# Patient Record
Sex: Female | Born: 1950 | Race: Black or African American | Hispanic: No | Marital: Married | Smoking: Current every day smoker
Health system: Southern US, Community
[De-identification: ages and names within clinical notes are randomized; demographics above are authoritative.]

## PROBLEM LIST (undated history)

## (undated) DIAGNOSIS — T8859XA Other complications of anesthesia, initial encounter: Secondary | ICD-10-CM

## (undated) DIAGNOSIS — R0602 Shortness of breath: Secondary | ICD-10-CM

## (undated) DIAGNOSIS — C50919 Malignant neoplasm of unspecified site of unspecified female breast: Secondary | ICD-10-CM

## (undated) DIAGNOSIS — K219 Gastro-esophageal reflux disease without esophagitis: Secondary | ICD-10-CM

## (undated) DIAGNOSIS — M1990 Unspecified osteoarthritis, unspecified site: Secondary | ICD-10-CM

## (undated) DIAGNOSIS — Q355 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: Secondary | ICD-10-CM

## (undated) DIAGNOSIS — G629 Polyneuropathy, unspecified: Secondary | ICD-10-CM

## (undated) DIAGNOSIS — I5022 Chronic systolic (congestive) heart failure: Secondary | ICD-10-CM

## (undated) DIAGNOSIS — E876 Hypokalemia: Secondary | ICD-10-CM

## (undated) DIAGNOSIS — E559 Vitamin D deficiency, unspecified: Secondary | ICD-10-CM

## (undated) DIAGNOSIS — G43909 Migraine, unspecified, not intractable, without status migrainosus: Secondary | ICD-10-CM

## (undated) DIAGNOSIS — Z9981 Dependence on supplemental oxygen: Secondary | ICD-10-CM

## (undated) DIAGNOSIS — R011 Cardiac murmur, unspecified: Secondary | ICD-10-CM

## (undated) DIAGNOSIS — F419 Anxiety disorder, unspecified: Secondary | ICD-10-CM

## (undated) DIAGNOSIS — E039 Hypothyroidism, unspecified: Secondary | ICD-10-CM

## (undated) DIAGNOSIS — D649 Anemia, unspecified: Secondary | ICD-10-CM

## (undated) DIAGNOSIS — F329 Major depressive disorder, single episode, unspecified: Secondary | ICD-10-CM

## (undated) DIAGNOSIS — E785 Hyperlipidemia, unspecified: Secondary | ICD-10-CM

## (undated) DIAGNOSIS — T4145XA Adverse effect of unspecified anesthetic, initial encounter: Secondary | ICD-10-CM

## (undated) DIAGNOSIS — I10 Essential (primary) hypertension: Secondary | ICD-10-CM

## (undated) DIAGNOSIS — E1140 Type 2 diabetes mellitus with diabetic neuropathy, unspecified: Secondary | ICD-10-CM

## (undated) DIAGNOSIS — F411 Generalized anxiety disorder: Secondary | ICD-10-CM

## (undated) DIAGNOSIS — J9610 Chronic respiratory failure, unspecified whether with hypoxia or hypercapnia: Secondary | ICD-10-CM

## (undated) DIAGNOSIS — I951 Orthostatic hypotension: Secondary | ICD-10-CM

## (undated) DIAGNOSIS — M549 Dorsalgia, unspecified: Secondary | ICD-10-CM

## (undated) DIAGNOSIS — I447 Left bundle-branch block, unspecified: Secondary | ICD-10-CM

## (undated) HISTORY — DX: Type 2 diabetes mellitus with diabetic neuropathy, unspecified: E11.40

## (undated) HISTORY — DX: Vitamin D deficiency, unspecified: E55.9

## (undated) HISTORY — PX: COLONOSCOPY: SHX174

## (undated) HISTORY — DX: Malignant neoplasm of unspecified site of unspecified female breast: C50.919

## (undated) HISTORY — DX: Essential (primary) hypertension: I10

## (undated) HISTORY — PX: WISDOM TOOTH EXTRACTION: SHX21

## (undated) HISTORY — PX: OTHER SURGICAL HISTORY: SHX169

## (undated) HISTORY — DX: Major depressive disorder, single episode, unspecified: F32.9

## (undated) HISTORY — DX: Generalized anxiety disorder: F41.1

## (undated) HISTORY — DX: Unspecified osteoarthritis, unspecified site: M19.90

---
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
## 1999-12-02 ENCOUNTER — Ambulatory Visit (HOSPITAL_COMMUNITY): Admission: RE | Admit: 1999-12-02 | Discharge: 1999-12-02 | Payer: Self-pay | Admitting: Gastroenterology

## 1999-12-03 ENCOUNTER — Encounter: Admission: RE | Admit: 1999-12-03 | Discharge: 2000-03-02 | Payer: Self-pay | Admitting: Family Medicine

## 1999-12-15 ENCOUNTER — Encounter: Payer: Self-pay | Admitting: Gastroenterology

## 1999-12-15 ENCOUNTER — Ambulatory Visit (HOSPITAL_COMMUNITY): Admission: RE | Admit: 1999-12-15 | Discharge: 1999-12-15 | Payer: Self-pay | Admitting: Gastroenterology

## 2000-03-15 ENCOUNTER — Other Ambulatory Visit: Admission: RE | Admit: 2000-03-15 | Discharge: 2000-03-15 | Payer: Self-pay | Admitting: Gynecology

## 2000-03-19 ENCOUNTER — Encounter (INDEPENDENT_AMBULATORY_CARE_PROVIDER_SITE_OTHER): Payer: Self-pay

## 2000-03-19 ENCOUNTER — Other Ambulatory Visit: Admission: RE | Admit: 2000-03-19 | Discharge: 2000-03-19 | Payer: Self-pay | Admitting: Gynecology

## 2000-05-24 ENCOUNTER — Inpatient Hospital Stay (HOSPITAL_COMMUNITY): Admission: EM | Admit: 2000-05-24 | Discharge: 2000-05-29 | Payer: Self-pay | Admitting: Emergency Medicine

## 2000-05-26 ENCOUNTER — Encounter: Payer: Self-pay | Admitting: Cardiology

## 2000-06-17 ENCOUNTER — Encounter (HOSPITAL_COMMUNITY): Admission: RE | Admit: 2000-06-17 | Discharge: 2000-06-21 | Payer: Self-pay | Admitting: Cardiology

## 2000-08-26 ENCOUNTER — Ambulatory Visit (HOSPITAL_COMMUNITY): Admission: RE | Admit: 2000-08-26 | Discharge: 2000-08-26 | Payer: Self-pay | Admitting: Neurology

## 2000-08-26 ENCOUNTER — Encounter: Payer: Self-pay | Admitting: Neurology

## 2000-12-29 ENCOUNTER — Other Ambulatory Visit: Admission: RE | Admit: 2000-12-29 | Discharge: 2000-12-29 | Payer: Self-pay | Admitting: Gynecology

## 2002-04-03 ENCOUNTER — Encounter (HOSPITAL_COMMUNITY): Admission: RE | Admit: 2002-04-03 | Discharge: 2002-07-02 | Payer: Self-pay | Admitting: Cardiology

## 2002-07-03 ENCOUNTER — Encounter (HOSPITAL_COMMUNITY): Admission: RE | Admit: 2002-07-03 | Discharge: 2002-10-01 | Payer: Self-pay | Admitting: Cardiology

## 2002-07-26 ENCOUNTER — Encounter: Admission: RE | Admit: 2002-07-26 | Discharge: 2002-10-24 | Payer: Self-pay | Admitting: Family Medicine

## 2002-08-09 ENCOUNTER — Other Ambulatory Visit: Admission: RE | Admit: 2002-08-09 | Discharge: 2002-08-09 | Payer: Self-pay | Admitting: Gynecology

## 2003-03-13 ENCOUNTER — Encounter: Admission: RE | Admit: 2003-03-13 | Discharge: 2003-03-13 | Payer: Self-pay | Admitting: Emergency Medicine

## 2003-03-13 ENCOUNTER — Encounter: Payer: Self-pay | Admitting: Emergency Medicine

## 2003-05-11 ENCOUNTER — Emergency Department (HOSPITAL_COMMUNITY): Admission: AD | Admit: 2003-05-11 | Discharge: 2003-05-11 | Payer: Self-pay | Admitting: Family Medicine

## 2003-05-24 ENCOUNTER — Encounter: Admission: RE | Admit: 2003-05-24 | Discharge: 2003-08-22 | Payer: Self-pay | Admitting: Family Medicine

## 2003-09-03 ENCOUNTER — Emergency Department (HOSPITAL_COMMUNITY): Admission: AD | Admit: 2003-09-03 | Discharge: 2003-09-03 | Payer: Self-pay | Admitting: Family Medicine

## 2003-11-28 ENCOUNTER — Encounter (INDEPENDENT_AMBULATORY_CARE_PROVIDER_SITE_OTHER): Payer: Self-pay | Admitting: *Deleted

## 2003-11-28 ENCOUNTER — Ambulatory Visit (HOSPITAL_COMMUNITY): Admission: RE | Admit: 2003-11-28 | Discharge: 2003-11-28 | Payer: Self-pay | Admitting: Gastroenterology

## 2003-11-29 ENCOUNTER — Encounter: Admission: RE | Admit: 2003-11-29 | Discharge: 2003-11-29 | Payer: Self-pay | Admitting: Family Medicine

## 2004-01-03 ENCOUNTER — Ambulatory Visit (HOSPITAL_COMMUNITY): Admission: RE | Admit: 2004-01-03 | Discharge: 2004-01-03 | Payer: Self-pay | Admitting: Gynecology

## 2004-01-03 ENCOUNTER — Encounter (INDEPENDENT_AMBULATORY_CARE_PROVIDER_SITE_OTHER): Payer: Self-pay | Admitting: *Deleted

## 2005-05-22 ENCOUNTER — Emergency Department (HOSPITAL_COMMUNITY): Admission: EM | Admit: 2005-05-22 | Discharge: 2005-05-22 | Payer: Self-pay | Admitting: Emergency Medicine

## 2005-09-16 ENCOUNTER — Encounter: Admission: RE | Admit: 2005-09-16 | Discharge: 2005-09-16 | Payer: Self-pay | Admitting: Family Medicine

## 2005-09-18 ENCOUNTER — Inpatient Hospital Stay (HOSPITAL_COMMUNITY): Admission: EM | Admit: 2005-09-18 | Discharge: 2005-09-20 | Payer: Self-pay | Admitting: Family Medicine

## 2006-01-14 ENCOUNTER — Other Ambulatory Visit: Admission: RE | Admit: 2006-01-14 | Discharge: 2006-01-14 | Payer: Self-pay | Admitting: Gynecology

## 2006-03-13 ENCOUNTER — Encounter: Admission: RE | Admit: 2006-03-13 | Discharge: 2006-03-13 | Payer: Self-pay | Admitting: Orthopedic Surgery

## 2006-03-23 ENCOUNTER — Inpatient Hospital Stay (HOSPITAL_COMMUNITY): Admission: AD | Admit: 2006-03-23 | Discharge: 2006-04-02 | Payer: Self-pay | Admitting: Family Medicine

## 2006-03-27 ENCOUNTER — Encounter (INDEPENDENT_AMBULATORY_CARE_PROVIDER_SITE_OTHER): Payer: Self-pay | Admitting: Specialist

## 2006-04-01 ENCOUNTER — Encounter: Payer: Self-pay | Admitting: Vascular Surgery

## 2006-07-01 ENCOUNTER — Inpatient Hospital Stay (HOSPITAL_COMMUNITY): Admission: EM | Admit: 2006-07-01 | Discharge: 2006-07-09 | Payer: Self-pay | Admitting: Emergency Medicine

## 2006-07-23 ENCOUNTER — Ambulatory Visit (HOSPITAL_COMMUNITY): Admission: RE | Admit: 2006-07-23 | Discharge: 2006-07-23 | Payer: Self-pay | Admitting: Family Medicine

## 2007-01-18 ENCOUNTER — Other Ambulatory Visit: Admission: RE | Admit: 2007-01-18 | Discharge: 2007-01-18 | Payer: Self-pay | Admitting: Gynecology

## 2007-01-18 ENCOUNTER — Encounter: Admission: RE | Admit: 2007-01-18 | Discharge: 2007-02-18 | Payer: Self-pay | Admitting: Family Medicine

## 2007-07-07 ENCOUNTER — Encounter: Admission: RE | Admit: 2007-07-07 | Discharge: 2007-07-07 | Payer: Self-pay | Admitting: Orthopedic Surgery

## 2008-01-30 ENCOUNTER — Emergency Department (HOSPITAL_COMMUNITY): Admission: EM | Admit: 2008-01-30 | Discharge: 2008-01-30 | Payer: Self-pay | Admitting: Emergency Medicine

## 2008-03-15 ENCOUNTER — Other Ambulatory Visit: Admission: RE | Admit: 2008-03-15 | Discharge: 2008-03-15 | Payer: Self-pay | Admitting: Gynecology

## 2008-04-06 ENCOUNTER — Encounter (INDEPENDENT_AMBULATORY_CARE_PROVIDER_SITE_OTHER): Payer: Self-pay | Admitting: Family Medicine

## 2008-04-06 ENCOUNTER — Ambulatory Visit: Payer: Self-pay | Admitting: Vascular Surgery

## 2008-04-06 ENCOUNTER — Ambulatory Visit (HOSPITAL_COMMUNITY): Admission: RE | Admit: 2008-04-06 | Discharge: 2008-04-06 | Payer: Self-pay | Admitting: Family Medicine

## 2008-05-13 ENCOUNTER — Encounter: Admission: RE | Admit: 2008-05-13 | Discharge: 2008-05-13 | Payer: Self-pay | Admitting: Orthopedic Surgery

## 2008-05-25 HISTORY — PX: CARDIAC CATHETERIZATION: SHX172

## 2008-06-01 ENCOUNTER — Encounter
Admission: RE | Admit: 2008-06-01 | Discharge: 2008-08-30 | Payer: Self-pay | Admitting: Physical Medicine & Rehabilitation

## 2008-06-04 ENCOUNTER — Ambulatory Visit: Payer: Self-pay | Admitting: Physical Medicine & Rehabilitation

## 2008-06-11 ENCOUNTER — Encounter
Admission: RE | Admit: 2008-06-11 | Discharge: 2008-06-11 | Payer: Self-pay | Admitting: Physical Medicine & Rehabilitation

## 2008-07-03 ENCOUNTER — Ambulatory Visit: Payer: Self-pay | Admitting: Physical Medicine & Rehabilitation

## 2008-09-15 ENCOUNTER — Ambulatory Visit: Payer: Self-pay | Admitting: Infectious Disease

## 2008-09-16 ENCOUNTER — Inpatient Hospital Stay (HOSPITAL_COMMUNITY): Admission: EM | Admit: 2008-09-16 | Discharge: 2008-09-17 | Payer: Self-pay | Admitting: Family Medicine

## 2008-09-17 ENCOUNTER — Encounter: Payer: Self-pay | Admitting: Infectious Disease

## 2008-11-01 ENCOUNTER — Inpatient Hospital Stay (HOSPITAL_COMMUNITY): Admission: EM | Admit: 2008-11-01 | Discharge: 2008-11-07 | Payer: Self-pay | Admitting: Emergency Medicine

## 2008-11-05 ENCOUNTER — Encounter (HOSPITAL_COMMUNITY): Admission: RE | Admit: 2008-11-05 | Discharge: 2008-12-26 | Payer: Self-pay | Admitting: Cardiology

## 2009-01-04 ENCOUNTER — Encounter: Admission: RE | Admit: 2009-01-04 | Discharge: 2009-01-04 | Payer: Self-pay | Admitting: Orthopedic Surgery

## 2009-05-28 ENCOUNTER — Ambulatory Visit: Payer: Self-pay | Admitting: Surgery

## 2009-05-28 ENCOUNTER — Ambulatory Visit (HOSPITAL_COMMUNITY)
Admission: RE | Admit: 2009-05-28 | Discharge: 2009-05-28 | Payer: Self-pay | Admitting: Physical Medicine and Rehabilitation

## 2009-05-28 ENCOUNTER — Encounter (INDEPENDENT_AMBULATORY_CARE_PROVIDER_SITE_OTHER): Payer: Self-pay | Admitting: Physical Medicine and Rehabilitation

## 2009-08-05 ENCOUNTER — Inpatient Hospital Stay (HOSPITAL_COMMUNITY): Admission: EM | Admit: 2009-08-05 | Discharge: 2009-08-06 | Payer: Self-pay | Admitting: Emergency Medicine

## 2010-04-10 ENCOUNTER — Encounter: Admission: RE | Admit: 2010-04-10 | Discharge: 2010-04-10 | Payer: Self-pay | Admitting: Family Medicine

## 2010-05-25 HISTORY — PX: PORTACATH PLACEMENT: SHX2246

## 2010-06-17 ENCOUNTER — Other Ambulatory Visit: Payer: Self-pay | Admitting: Radiology

## 2010-06-23 ENCOUNTER — Ambulatory Visit: Payer: Self-pay | Admitting: Oncology

## 2010-06-25 ENCOUNTER — Other Ambulatory Visit: Payer: Self-pay | Admitting: Radiology

## 2010-06-25 DIAGNOSIS — C50912 Malignant neoplasm of unspecified site of left female breast: Secondary | ICD-10-CM

## 2010-06-28 ENCOUNTER — Encounter: Payer: Self-pay | Admitting: Radiology

## 2010-06-28 ENCOUNTER — Other Ambulatory Visit: Payer: Self-pay

## 2010-07-04 ENCOUNTER — Inpatient Hospital Stay
Admission: RE | Admit: 2010-07-04 | Discharge: 2010-07-04 | Payer: Self-pay | Source: Ambulatory Visit | Attending: Radiology | Admitting: Radiology

## 2010-07-05 ENCOUNTER — Ambulatory Visit
Admission: RE | Admit: 2010-07-05 | Discharge: 2010-07-05 | Disposition: A | Discharge status: Home / Self Care | Payer: 59 | Source: Ambulatory Visit | Attending: Radiology | Admitting: Radiology

## 2010-07-05 DIAGNOSIS — C50912 Malignant neoplasm of unspecified site of left female breast: Secondary | ICD-10-CM

## 2010-07-05 MED ORDER — GADOBENATE DIMEGLUMINE 529 MG/ML IV SOLN
20.0000 mL | Freq: Once | INTRAVENOUS | Status: AC | PRN
  Administered 2010-07-05: 20 mL via INTRAVENOUS

## 2010-07-07 ENCOUNTER — Ambulatory Visit: Payer: Self-pay | Admitting: Psychiatry

## 2010-07-07 ENCOUNTER — Encounter: Payer: Self-pay | Admitting: Genetic Counselor

## 2010-07-09 ENCOUNTER — Ambulatory Visit: Payer: Medicare Other | Admitting: Radiation Oncology

## 2010-07-09 ENCOUNTER — Other Ambulatory Visit: Payer: Self-pay | Admitting: Radiology

## 2010-07-09 DIAGNOSIS — R928 Other abnormal and inconclusive findings on diagnostic imaging of breast: Secondary | ICD-10-CM

## 2010-07-15 ENCOUNTER — Ambulatory Visit: Payer: 59

## 2010-07-18 ENCOUNTER — Other Ambulatory Visit: Payer: Self-pay | Admitting: Diagnostic Radiology

## 2010-07-18 ENCOUNTER — Ambulatory Visit
Admission: RE | Admit: 2010-07-18 | Discharge: 2010-07-18 | Disposition: A | Discharge status: Home / Self Care | Payer: Medicare Other | Source: Ambulatory Visit | Attending: Radiology | Admitting: Radiology

## 2010-07-18 ENCOUNTER — Ambulatory Visit
Admission: RE | Admit: 2010-07-18 | Discharge: 2010-07-18 | Disposition: A | Discharge status: Home / Self Care | Payer: 59 | Source: Ambulatory Visit | Attending: Radiology | Admitting: Radiology

## 2010-07-18 DIAGNOSIS — R928 Other abnormal and inconclusive findings on diagnostic imaging of breast: Secondary | ICD-10-CM

## 2010-07-18 MED ORDER — GADOBENATE DIMEGLUMINE 529 MG/ML IV SOLN
20.0000 mL | Freq: Once | INTRAVENOUS | Status: AC | PRN
  Administered 2010-07-18: 20 mL via INTRAVENOUS

## 2010-07-21 ENCOUNTER — Ambulatory Visit: Payer: PRIVATE HEALTH INSURANCE | Attending: Radiation Oncology | Admitting: Radiation Oncology

## 2010-07-21 ENCOUNTER — Ambulatory Visit: Payer: 59 | Admitting: Psychiatry

## 2010-07-21 ENCOUNTER — Encounter: Payer: 59 | Admitting: Genetic Counselor

## 2010-07-21 DIAGNOSIS — Z171 Estrogen receptor negative status [ER-]: Secondary | ICD-10-CM | POA: Insufficient documentation

## 2010-07-21 DIAGNOSIS — M109 Gout, unspecified: Secondary | ICD-10-CM | POA: Insufficient documentation

## 2010-07-21 DIAGNOSIS — Z803 Family history of malignant neoplasm of breast: Secondary | ICD-10-CM | POA: Insufficient documentation

## 2010-07-21 DIAGNOSIS — J4489 Other specified chronic obstructive pulmonary disease: Secondary | ICD-10-CM | POA: Insufficient documentation

## 2010-07-21 DIAGNOSIS — J449 Chronic obstructive pulmonary disease, unspecified: Secondary | ICD-10-CM | POA: Insufficient documentation

## 2010-07-21 DIAGNOSIS — E039 Hypothyroidism, unspecified: Secondary | ICD-10-CM | POA: Insufficient documentation

## 2010-07-21 DIAGNOSIS — E119 Type 2 diabetes mellitus without complications: Secondary | ICD-10-CM | POA: Insufficient documentation

## 2010-07-21 DIAGNOSIS — C50419 Malignant neoplasm of upper-outer quadrant of unspecified female breast: Secondary | ICD-10-CM | POA: Insufficient documentation

## 2010-07-21 DIAGNOSIS — I447 Left bundle-branch block, unspecified: Secondary | ICD-10-CM | POA: Insufficient documentation

## 2010-07-21 DIAGNOSIS — E7800 Pure hypercholesterolemia, unspecified: Secondary | ICD-10-CM | POA: Insufficient documentation

## 2010-07-21 DIAGNOSIS — I509 Heart failure, unspecified: Secondary | ICD-10-CM | POA: Insufficient documentation

## 2010-07-21 DIAGNOSIS — G4700 Insomnia, unspecified: Secondary | ICD-10-CM | POA: Insufficient documentation

## 2010-07-21 DIAGNOSIS — I428 Other cardiomyopathies: Secondary | ICD-10-CM | POA: Insufficient documentation

## 2010-07-22 ENCOUNTER — Encounter (HOSPITAL_BASED_OUTPATIENT_CLINIC_OR_DEPARTMENT_OTHER): Payer: Medicare Other | Admitting: Oncology

## 2010-07-22 ENCOUNTER — Other Ambulatory Visit: Payer: Self-pay | Admitting: Oncology

## 2010-07-22 ENCOUNTER — Other Ambulatory Visit (HOSPITAL_COMMUNITY): Payer: Self-pay | Admitting: General Surgery

## 2010-07-22 DIAGNOSIS — C50919 Malignant neoplasm of unspecified site of unspecified female breast: Secondary | ICD-10-CM

## 2010-07-22 DIAGNOSIS — D0590 Unspecified type of carcinoma in situ of unspecified breast: Secondary | ICD-10-CM

## 2010-07-22 LAB — CBC WITH DIFFERENTIAL/PLATELET
BASO%: 0.2 % (ref 0.0–2.0)
Basophils Absolute: 0 10*3/uL (ref 0.0–0.1)
EOS%: 6.8 % (ref 0.0–7.0)
Eosinophils Absolute: 0.4 10*3/uL (ref 0.0–0.5)
HCT: 33.8 % — ABNORMAL LOW (ref 34.8–46.6)
HGB: 11.2 g/dL — ABNORMAL LOW (ref 11.6–15.9)
LYMPH%: 26 % (ref 14.0–49.7)
MCH: 31.4 pg (ref 25.1–34.0)
MCHC: 33.1 g/dL (ref 31.5–36.0)
MCV: 94.7 fL (ref 79.5–101.0)
MONO#: 0.4 10*3/uL (ref 0.1–0.9)
MONO%: 7.7 % (ref 0.0–14.0)
NEUT#: 3.4 10*3/uL (ref 1.5–6.5)
NEUT%: 59.3 % (ref 38.4–76.8)
Platelets: 337 10*3/uL (ref 145–400)
RBC: 3.57 10*6/uL — ABNORMAL LOW (ref 3.70–5.45)
RDW: 14.5 % (ref 11.2–14.5)
WBC: 5.7 10*3/uL (ref 3.9–10.3)
lymph#: 1.5 10*3/uL (ref 0.9–3.3)

## 2010-07-22 LAB — COMPREHENSIVE METABOLIC PANEL
ALT: 18 U/L (ref 0–35)
AST: 14 U/L (ref 0–37)
Albumin: 3.4 g/dL — ABNORMAL LOW (ref 3.5–5.2)
Alkaline Phosphatase: 118 U/L — ABNORMAL HIGH (ref 39–117)
BUN: 20 mg/dL (ref 6–23)
CO2: 29 mEq/L (ref 19–32)
Calcium: 9.1 mg/dL (ref 8.4–10.5)
Chloride: 104 mEq/L (ref 96–112)
Creatinine, Ser: 1.2 mg/dL (ref 0.40–1.20)
Glucose, Bld: 138 mg/dL — ABNORMAL HIGH (ref 70–99)
Potassium: 4 mEq/L (ref 3.5–5.3)
Sodium: 144 mEq/L (ref 135–145)
Total Bilirubin: 0.3 mg/dL (ref 0.3–1.2)
Total Protein: 6.4 g/dL (ref 6.0–8.3)

## 2010-07-23 ENCOUNTER — Ambulatory Visit (INDEPENDENT_AMBULATORY_CARE_PROVIDER_SITE_OTHER): Payer: Medicare Other | Admitting: Psychiatry

## 2010-07-23 DIAGNOSIS — Z638 Other specified problems related to primary support group: Secondary | ICD-10-CM

## 2010-07-23 DIAGNOSIS — F0630 Mood disorder due to known physiological condition, unspecified: Secondary | ICD-10-CM

## 2010-07-25 ENCOUNTER — Other Ambulatory Visit (HOSPITAL_COMMUNITY): Payer: Self-pay | Admitting: General Surgery

## 2010-07-25 ENCOUNTER — Encounter (HOSPITAL_COMMUNITY)
Admission: RE | Admit: 2010-07-25 | Discharge: 2010-07-25 | Disposition: A | Discharge status: Home / Self Care | Payer: Medicare Other | Source: Ambulatory Visit | Attending: General Surgery | Admitting: General Surgery

## 2010-07-25 ENCOUNTER — Ambulatory Visit (HOSPITAL_COMMUNITY)
Admission: RE | Admit: 2010-07-25 | Discharge: 2010-07-25 | Disposition: A | Discharge status: Home / Self Care | Payer: Medicare Other | Source: Ambulatory Visit | Attending: General Surgery | Admitting: General Surgery

## 2010-07-25 DIAGNOSIS — J45909 Unspecified asthma, uncomplicated: Secondary | ICD-10-CM | POA: Insufficient documentation

## 2010-07-25 DIAGNOSIS — I517 Cardiomegaly: Secondary | ICD-10-CM | POA: Insufficient documentation

## 2010-07-25 DIAGNOSIS — C50919 Malignant neoplasm of unspecified site of unspecified female breast: Secondary | ICD-10-CM

## 2010-07-25 DIAGNOSIS — Z01812 Encounter for preprocedural laboratory examination: Secondary | ICD-10-CM | POA: Insufficient documentation

## 2010-07-25 DIAGNOSIS — Z01818 Encounter for other preprocedural examination: Secondary | ICD-10-CM | POA: Insufficient documentation

## 2010-07-25 DIAGNOSIS — Z01810 Encounter for preprocedural cardiovascular examination: Secondary | ICD-10-CM | POA: Insufficient documentation

## 2010-07-25 DIAGNOSIS — I10 Essential (primary) hypertension: Secondary | ICD-10-CM | POA: Insufficient documentation

## 2010-07-25 LAB — DIFFERENTIAL
Basophils Absolute: 0 10*3/uL (ref 0.0–0.1)
Basophils Relative: 0 % (ref 0–1)
Eosinophils Absolute: 0.4 10*3/uL (ref 0.0–0.7)
Eosinophils Relative: 5 % (ref 0–5)
Lymphocytes Relative: 24 % (ref 12–46)
Lymphs Abs: 1.9 10*3/uL (ref 0.7–4.0)
Monocytes Absolute: 0.6 10*3/uL (ref 0.1–1.0)
Monocytes Relative: 8 % (ref 3–12)
Neutro Abs: 5.1 10*3/uL (ref 1.7–7.7)
Neutrophils Relative %: 63 % (ref 43–77)

## 2010-07-25 LAB — CBC
HCT: 34.9 % — ABNORMAL LOW (ref 36.0–46.0)
Hemoglobin: 11.4 g/dL — ABNORMAL LOW (ref 12.0–15.0)
MCH: 31.3 pg (ref 26.0–34.0)
MCHC: 32.7 g/dL (ref 30.0–36.0)
MCV: 95.9 fL (ref 78.0–100.0)
Platelets: 427 10*3/uL — ABNORMAL HIGH (ref 150–400)
RBC: 3.64 MIL/uL — ABNORMAL LOW (ref 3.87–5.11)
RDW: 15 % (ref 11.5–15.5)
WBC: 8 10*3/uL (ref 4.0–10.5)

## 2010-07-25 LAB — COMPREHENSIVE METABOLIC PANEL
ALT: 18 U/L (ref 0–35)
AST: 20 U/L (ref 0–37)
Albumin: 3.3 g/dL — ABNORMAL LOW (ref 3.5–5.2)
Alkaline Phosphatase: 104 U/L (ref 39–117)
BUN: 13 mg/dL (ref 6–23)
CO2: 26 mEq/L (ref 19–32)
Calcium: 9 mg/dL (ref 8.4–10.5)
Chloride: 106 mEq/L (ref 96–112)
Creatinine, Ser: 1.18 mg/dL (ref 0.4–1.2)
GFR calc Af Amer: 57 mL/min — ABNORMAL LOW (ref >60)
GFR calc non Af Amer: 47 mL/min — ABNORMAL LOW (ref >60)
Glucose, Bld: 123 mg/dL — ABNORMAL HIGH (ref 70–99)
Potassium: 3.6 mEq/L (ref 3.5–5.1)
Sodium: 141 mEq/L (ref 135–145)
Total Bilirubin: 0.4 mg/dL (ref 0.3–1.2)
Total Protein: 6.4 g/dL (ref 6.0–8.3)

## 2010-07-25 LAB — SURGICAL PCR SCREEN
MRSA, PCR: NEGATIVE
Staphylococcus aureus: NEGATIVE

## 2010-07-25 LAB — CANCER ANTIGEN 27.29: CA 27.29: 25 U/mL (ref 0–39)

## 2010-07-28 ENCOUNTER — Ambulatory Visit (HOSPITAL_COMMUNITY)
Admission: RE | Admit: 2010-07-28 | Discharge: 2010-07-28 | Disposition: A | Discharge status: Home / Self Care | Payer: Medicare Other | Source: Ambulatory Visit | Attending: General Surgery | Admitting: General Surgery

## 2010-07-28 ENCOUNTER — Other Ambulatory Visit: Payer: Self-pay | Admitting: General Surgery

## 2010-07-28 ENCOUNTER — Observation Stay (HOSPITAL_COMMUNITY)
Admission: RE | Admit: 2010-07-28 | Discharge: 2010-07-29 | Disposition: A | Discharge status: Home / Self Care | Payer: Medicare Other | Source: Ambulatory Visit | Attending: General Surgery | Admitting: General Surgery

## 2010-07-28 DIAGNOSIS — I428 Other cardiomyopathies: Secondary | ICD-10-CM | POA: Insufficient documentation

## 2010-07-28 DIAGNOSIS — C50919 Malignant neoplasm of unspecified site of unspecified female breast: Principal | ICD-10-CM | POA: Insufficient documentation

## 2010-07-28 DIAGNOSIS — I10 Essential (primary) hypertension: Secondary | ICD-10-CM | POA: Insufficient documentation

## 2010-07-28 DIAGNOSIS — E669 Obesity, unspecified: Secondary | ICD-10-CM | POA: Insufficient documentation

## 2010-07-28 HISTORY — PX: BREAST LUMPECTOMY: SHX2

## 2010-07-28 LAB — GLUCOSE, CAPILLARY
Glucose-Capillary: 143 mg/dL — ABNORMAL HIGH (ref 70–99)
Glucose-Capillary: 103 mg/dL — ABNORMAL HIGH (ref 70–99)
Glucose-Capillary: 95 mg/dL (ref 70–99)
Glucose-Capillary: 143 mg/dL — ABNORMAL HIGH (ref 70–99)

## 2010-07-28 MED ORDER — TECHNETIUM TC 99M SULFUR COLLOID FILTERED
1.0000 | Freq: Once | INTRAVENOUS | Status: AC | PRN
  Administered 2010-07-28: 1 via INTRADERMAL

## 2010-07-29 LAB — GLUCOSE, CAPILLARY: Glucose-Capillary: 136 mg/dL — ABNORMAL HIGH (ref 70–99)

## 2010-07-31 NOTE — Op Note (Addendum)
Rhonda [name], Rhonda [name] NO.:  [id number]  MEDICAL RECORD NO.:  [id number]           PATIENT TYPE:  I  LOCATION:  5158                         FACILITY:  [facility]  PHYSICIAN:  Juanetta Gosling, MDDATE OF BIRTH:  05-11-1951  DATE OF PROCEDURE:  07/28/2010 DATE OF DISCHARGE:                              OPERATIVE REPORT   PREOPERATIVE DIAGNOSIS:  Clinical stage I left breast cancer.  POSTOPERATIVE DIAGNOSIS:  Clinical stage I left breast cancer.  PROCEDURES: 1. Left breast wire-guided lumpectomy. 2. Injection of methylene blue dye for sentinel lymph node     identification. 3. Left axillary sentinel node biopsy x2.  SURGEON:  Troy Sine. Dwain Sarna, MD  ASSISTANT:  None.  ANESTHESIA:  General.  SUPERVISING ANESTHESIOLOGIST:  Quita Skye. Krista Blue, MD  SPECIMEN: 1. Left breast tissue marked with paint kit. 2. Left axillary sentinel node with counts of 3704. 3. Left axillary sentinel node with counts of 1583 and blue.  DISPOSITION OF SPECIMENS:  Pathology.  ESTIMATED BLOOD LOSS:  Minimal.  COMPLICATIONS:  None.  DRAINS:  None.  DISPOSITION:  To recovery room in stable conditions.  HISTORY:  Rhonda [name] is a 60 year old female I initially saw when she had an active upper respiratory infection in early February 2012 with a newly diagnosed left breast cancer.  This on ultrasound mammogram and MRI appeared to be less than 2-cm breast mass in the upper outer quadrant.  She had another area found on MRI that was biopsied showing benign fibrocystic changes and according to the radiologist there no need for any further evaluation of this.  She had been evaluated by Dr. Dorothy Puffer preoperatively and determined she is a candidate for radiation due to my concern for her pulmonary issues, she is and I brought her back.  Her pathology showed ductal carcinoma.  There was a concern for microinvasion, so we discussed the different options and we elected to do breast  conservation therapy with a sentinel node biopsy.  PROCEDURE IN DETAIL:  After informed consent was obtained, the patient [facility] where she had a wire placed up by Dr. Tilda Burrow.  I discussed the wire placement with him prior to beginning her surgery and I had the mammograms available for my review during the surgery.  She was then brought to [facility].  She was administered 400 mg of IV ciprofloxacin due to her multiple allergies. She had sequential compression devices placed on lower extremities prior to induction of anesthesia.  She had technetium administered in the standard periareolar fashion prior to beginning.  She was then taken to the operating room, placed under general anesthesia without complication.  Her left breast and axilla were then prepped and draped in standard sterile surgical fashion.  Surgical time-out was then performed.  I made a radial incision overlying her lesion as well as her wire.  A cautery was then used to excise the breast tissue past the wire.  This was marked down to the pectoralis fascia, as some of this was actually underneath her pectoralis muscle towards her axilla.  This was then excised in total.  I placed two clips deep and one clip in each cardinal position of this.  Hemostasis was obtained.  A Faxitron mammogram confirmed removal of the clip as well as the lesion.  This was confirmed by the radiologist as well.  This was packed with a sponge.  I then made an incision in her axilla.  I located two sentinel nodes, one with the counts as listed above.  These were both removed.  The background radioactivity was less than 40.  There were no other blue nodes that were present.  I then closed her axillary fascia with a 2-0 Vicryl, the dermis with a 3-0 Vicryl, skin with a 4-0 Monocryl in subcuticular fashion.  I closed her breast tissue with multiple layers of 2-0 Vicryl bringing her breast tissue together to try to  obliterate her cavity, then 3-0 Vicryl, some 4-0 Monocryl for the skin.  Steri-Strips and sterile dressings were placed.  A breast binder was placed and she was extubated in the operating room and transferred to the recovery in stable condition.     Juanetta Gosling, MD     MCW/MEDQ  D:  07/28/2010  T:  07/29/2010  Job:  [id number]  cc:   Otilio Connors. [name], M.D. Radene Gunning, MD, PhD Drue Second, MD Rogelia Mire, M.D.  Electronically Signed by Emelia Loron MD on 07/29/2010 07:43:20 PM

## 2010-08-11 ENCOUNTER — Encounter (HOSPITAL_BASED_OUTPATIENT_CLINIC_OR_DEPARTMENT_OTHER): Payer: PRIVATE HEALTH INSURANCE | Admitting: Oncology

## 2010-08-11 DIAGNOSIS — D0590 Unspecified type of carcinoma in situ of unspecified breast: Secondary | ICD-10-CM

## 2010-08-13 ENCOUNTER — Other Ambulatory Visit: Payer: Self-pay | Admitting: Oncology

## 2010-08-13 ENCOUNTER — Ambulatory Visit: Payer: Medicare Other | Admitting: Psychiatry

## 2010-08-13 ENCOUNTER — Ambulatory Visit: Payer: PRIVATE HEALTH INSURANCE | Admitting: Radiation Oncology

## 2010-08-13 DIAGNOSIS — C50919 Malignant neoplasm of unspecified site of unspecified female breast: Secondary | ICD-10-CM

## 2010-08-14 ENCOUNTER — Other Ambulatory Visit: Payer: Self-pay | Admitting: Oncology

## 2010-08-14 ENCOUNTER — Ambulatory Visit (HOSPITAL_COMMUNITY)
Admission: RE | Admit: 2010-08-14 | Discharge: 2010-08-14 | Disposition: A | Discharge status: Home / Self Care | Payer: Medicare Other | Source: Ambulatory Visit | Attending: Oncology | Admitting: Oncology

## 2010-08-14 DIAGNOSIS — Z01810 Encounter for preprocedural cardiovascular examination: Secondary | ICD-10-CM | POA: Insufficient documentation

## 2010-08-14 DIAGNOSIS — I10 Essential (primary) hypertension: Secondary | ICD-10-CM | POA: Insufficient documentation

## 2010-08-14 DIAGNOSIS — C50919 Malignant neoplasm of unspecified site of unspecified female breast: Secondary | ICD-10-CM | POA: Insufficient documentation

## 2010-08-14 DIAGNOSIS — E119 Type 2 diabetes mellitus without complications: Secondary | ICD-10-CM | POA: Insufficient documentation

## 2010-08-14 DIAGNOSIS — I059 Rheumatic mitral valve disease, unspecified: Secondary | ICD-10-CM | POA: Insufficient documentation

## 2010-08-15 ENCOUNTER — Ambulatory Visit (HOSPITAL_COMMUNITY)
Admission: RE | Admit: 2010-08-15 | Payer: Medicare Other | Source: Ambulatory Visit | Admitting: Interventional Radiology

## 2010-08-15 LAB — GLUCOSE, CAPILLARY
Glucose-Capillary: 152 mg/dL — ABNORMAL HIGH (ref 70–99)
Glucose-Capillary: 153 mg/dL — ABNORMAL HIGH (ref 70–99)
Glucose-Capillary: 172 mg/dL — ABNORMAL HIGH (ref 70–99)
Glucose-Capillary: 178 mg/dL — ABNORMAL HIGH (ref 70–99)
Glucose-Capillary: 186 mg/dL — ABNORMAL HIGH (ref 70–99)
Glucose-Capillary: 200 mg/dL — ABNORMAL HIGH (ref 70–99)
Glucose-Capillary: 201 mg/dL — ABNORMAL HIGH (ref 70–99)
Glucose-Capillary: 203 mg/dL — ABNORMAL HIGH (ref 70–99)
Glucose-Capillary: 219 mg/dL — ABNORMAL HIGH (ref 70–99)

## 2010-08-15 LAB — CARDIAC PANEL(CRET KIN+CKTOT+MB+TROPI)
CK, MB: 0.5 ng/mL (ref 0.3–4.0)
CK, MB: 0.6 ng/mL (ref 0.3–4.0)
CK, MB: 0.6 ng/mL (ref 0.3–4.0)
Relative Index: INVALID (ref 0.0–2.5)
Relative Index: INVALID (ref 0.0–2.5)
Relative Index: INVALID (ref 0.0–2.5)
Total CK: 29 U/L (ref 7–177)
Total CK: 32 U/L (ref 7–177)
Total CK: 61 U/L (ref 7–177)
Troponin I: 0.01 ng/mL (ref 0.00–0.06)
Troponin I: 0.01 ng/mL (ref 0.00–0.06)
Troponin I: 0.01 ng/mL (ref 0.00–0.06)

## 2010-08-15 LAB — POCT I-STAT, CHEM 8
BUN: 37 mg/dL — ABNORMAL HIGH (ref 6–23)
Calcium, Ion: 1.03 mmol/L — ABNORMAL LOW (ref 1.12–1.32)
Chloride: 95 mEq/L — ABNORMAL LOW (ref 96–112)
Creatinine, Ser: 1.8 mg/dL — ABNORMAL HIGH (ref 0.4–1.2)
Glucose, Bld: 158 mg/dL — ABNORMAL HIGH (ref 70–99)
HCT: 39 % (ref 36.0–46.0)
Hemoglobin: 13.3 g/dL (ref 12.0–15.0)
Potassium: 3.4 mEq/L — ABNORMAL LOW (ref 3.5–5.1)
Sodium: 134 mEq/L — ABNORMAL LOW (ref 135–145)
TCO2: 43 mmol/L (ref 0–100)

## 2010-08-15 LAB — HEMOGLOBIN A1C
Hgb A1c MFr Bld: 8.5 % — ABNORMAL HIGH (ref 4.6–6.1)
Mean Plasma Glucose: 197 mg/dL

## 2010-08-15 LAB — CBC
HCT: 37.7 % (ref 36.0–46.0)
HCT: 36.2 % (ref 36.0–46.0)
HCT: 38.5 % (ref 36.0–46.0)
Hemoglobin: 11.9 g/dL — ABNORMAL LOW (ref 12.0–15.0)
Hemoglobin: 12.5 g/dL (ref 12.0–15.0)
Hemoglobin: 13.1 g/dL (ref 12.0–15.0)
MCH: 31.2 pg (ref 26.0–34.0)
MCHC: 31.6 g/dL (ref 30.0–36.0)
MCHC: 34 g/dL (ref 30.0–36.0)
MCHC: 34.5 g/dL (ref 30.0–36.0)
MCV: 98.7 fL (ref 78.0–100.0)
MCV: 98.5 fL (ref 78.0–100.0)
MCV: 98.5 fL (ref 78.0–100.0)
Platelets: 342 10*3/uL (ref 150–400)
Platelets: 338 10*3/uL (ref 150–400)
Platelets: 365 10*3/uL (ref 150–400)
RBC: 3.82 MIL/uL — ABNORMAL LOW (ref 3.87–5.11)
RBC: 3.67 MIL/uL — ABNORMAL LOW (ref 3.87–5.11)
RBC: 3.91 MIL/uL (ref 3.87–5.11)
RDW: 15 % (ref 11.5–15.5)
RDW: 14.2 % (ref 11.5–15.5)
RDW: 14.4 % (ref 11.5–15.5)
WBC: 7.9 10*3/uL (ref 4.0–10.5)
WBC: 10 10*3/uL (ref 4.0–10.5)
WBC: 10.2 10*3/uL (ref 4.0–10.5)

## 2010-08-15 LAB — COMPREHENSIVE METABOLIC PANEL
ALT: 16 U/L (ref 0–35)
ALT: 16 U/L (ref 0–35)
AST: 12 U/L (ref 0–37)
AST: 13 U/L (ref 0–37)
Albumin: 2.8 g/dL — ABNORMAL LOW (ref 3.5–5.2)
Albumin: 2.8 g/dL — ABNORMAL LOW (ref 3.5–5.2)
Alkaline Phosphatase: 97 U/L (ref 39–117)
Alkaline Phosphatase: 98 U/L (ref 39–117)
BUN: 22 mg/dL (ref 6–23)
BUN: 29 mg/dL — ABNORMAL HIGH (ref 6–23)
CO2: 28 mEq/L (ref 19–32)
CO2: 35 mEq/L — ABNORMAL HIGH (ref 19–32)
Calcium: 8.6 mg/dL (ref 8.4–10.5)
Calcium: 8.8 mg/dL (ref 8.4–10.5)
Chloride: 100 mEq/L (ref 96–112)
Chloride: 94 mEq/L — ABNORMAL LOW (ref 96–112)
Creatinine, Ser: 1.08 mg/dL (ref 0.4–1.2)
Creatinine, Ser: 1.43 mg/dL — ABNORMAL HIGH (ref 0.4–1.2)
GFR calc Af Amer: 46 mL/min — ABNORMAL LOW (ref >60)
GFR calc Af Amer: >60 mL/min (ref >60)
GFR calc non Af Amer: 38 mL/min — ABNORMAL LOW (ref >60)
GFR calc non Af Amer: 52 mL/min — ABNORMAL LOW (ref >60)
Glucose, Bld: 168 mg/dL — ABNORMAL HIGH (ref 70–99)
Glucose, Bld: 233 mg/dL — ABNORMAL HIGH (ref 70–99)
Potassium: 3 mEq/L — ABNORMAL LOW (ref 3.5–5.1)
Potassium: 3.2 mEq/L — ABNORMAL LOW (ref 3.5–5.1)
Sodium: 139 mEq/L (ref 135–145)
Sodium: 139 mEq/L (ref 135–145)
Total Bilirubin: 0.6 mg/dL (ref 0.3–1.2)
Total Bilirubin: 0.7 mg/dL (ref 0.3–1.2)
Total Protein: 6.3 g/dL (ref 6.0–8.3)
Total Protein: 6.7 g/dL (ref 6.0–8.3)

## 2010-08-15 LAB — DIFFERENTIAL
Basophils Absolute: 0 10*3/uL (ref 0.0–0.1)
Basophils Relative: 0 % (ref 0–1)
Eosinophils Absolute: 0.2 10*3/uL (ref 0.0–0.7)
Eosinophils Relative: 2 % (ref 0–5)
Lymphocytes Relative: 28 % (ref 12–46)
Lymphs Abs: 2.8 10*3/uL (ref 0.7–4.0)
Monocytes Absolute: 0.5 10*3/uL (ref 0.1–1.0)
Monocytes Relative: 5 % (ref 3–12)
Neutro Abs: 6.7 10*3/uL (ref 1.7–7.7)
Neutrophils Relative %: 65 % (ref 43–77)

## 2010-08-15 LAB — URINE MICROSCOPIC-ADD ON

## 2010-08-15 LAB — URINALYSIS, ROUTINE W REFLEX MICROSCOPIC
Bilirubin Urine: NEGATIVE
Glucose, UA: NEGATIVE mg/dL
Hgb urine dipstick: NEGATIVE
Ketones, ur: NEGATIVE mg/dL
Nitrite: NEGATIVE
Protein, ur: NEGATIVE mg/dL
Specific Gravity, Urine: 1.014 (ref 1.005–1.030)
Urobilinogen, UA: 0.2 mg/dL (ref 0.0–1.0)
pH: 6.5 (ref 5.0–8.0)

## 2010-08-15 LAB — CK TOTAL AND CKMB (NOT AT ARMC)
CK, MB: 0.5 ng/mL (ref 0.3–4.0)
Relative Index: INVALID (ref 0.0–2.5)
Total CK: 29 U/L (ref 7–177)

## 2010-08-15 LAB — LIPID PANEL
Cholesterol: 163 mg/dL (ref 0–200)
HDL: 41 mg/dL (ref >39)
LDL Cholesterol: 92 mg/dL (ref 0–99)
Total CHOL/HDL Ratio: 4 RATIO
Triglycerides: 149 mg/dL (ref <150)
VLDL: 30 mg/dL (ref 0–40)

## 2010-08-15 LAB — BRAIN NATRIURETIC PEPTIDE: Pro B Natriuretic peptide (BNP): <30 pg/mL (ref 0.0–100.0)

## 2010-08-15 LAB — URINE CULTURE: Colony Count: >=100000

## 2010-08-15 LAB — D-DIMER, QUANTITATIVE (NOT AT ARMC): D-Dimer, Quant: 0.29 ug/mL-FEU (ref 0.00–0.48)

## 2010-08-15 LAB — TSH: TSH: 3.696 u[IU]/mL (ref 0.350–4.500)

## 2010-08-15 LAB — TROPONIN I: Troponin I: 0.01 ng/mL (ref 0.00–0.06)

## 2010-08-15 LAB — PHOSPHORUS: Phosphorus: 4.2 mg/dL (ref 2.3–4.6)

## 2010-08-15 LAB — MAGNESIUM: Magnesium: 1.9 mg/dL (ref 1.5–2.5)

## 2010-08-15 LAB — POCT CARDIAC MARKERS
CKMB, poc: <1 ng/mL — ABNORMAL LOW (ref 1.0–8.0)
Myoglobin, poc: 108 ng/mL (ref 12–200)
Troponin i, poc: <0.05 ng/mL (ref 0.00–0.09)

## 2010-08-18 ENCOUNTER — Ambulatory Visit (HOSPITAL_COMMUNITY)
Admission: RE | Admit: 2010-08-18 | Discharge: 2010-08-18 | Disposition: A | Discharge status: Home / Self Care | Payer: Medicare Other | Source: Ambulatory Visit | Attending: Oncology | Admitting: Oncology

## 2010-08-18 DIAGNOSIS — F3289 Other specified depressive episodes: Secondary | ICD-10-CM | POA: Insufficient documentation

## 2010-08-18 DIAGNOSIS — I428 Other cardiomyopathies: Secondary | ICD-10-CM | POA: Insufficient documentation

## 2010-08-18 DIAGNOSIS — C50919 Malignant neoplasm of unspecified site of unspecified female breast: Secondary | ICD-10-CM | POA: Insufficient documentation

## 2010-08-18 DIAGNOSIS — I447 Left bundle-branch block, unspecified: Secondary | ICD-10-CM | POA: Insufficient documentation

## 2010-08-18 DIAGNOSIS — E119 Type 2 diabetes mellitus without complications: Secondary | ICD-10-CM | POA: Insufficient documentation

## 2010-08-18 DIAGNOSIS — M109 Gout, unspecified: Secondary | ICD-10-CM | POA: Insufficient documentation

## 2010-08-18 DIAGNOSIS — J4489 Other specified chronic obstructive pulmonary disease: Secondary | ICD-10-CM | POA: Insufficient documentation

## 2010-08-18 DIAGNOSIS — Z171 Estrogen receptor negative status [ER-]: Secondary | ICD-10-CM | POA: Insufficient documentation

## 2010-08-18 DIAGNOSIS — J449 Chronic obstructive pulmonary disease, unspecified: Secondary | ICD-10-CM | POA: Insufficient documentation

## 2010-08-18 DIAGNOSIS — E039 Hypothyroidism, unspecified: Secondary | ICD-10-CM | POA: Insufficient documentation

## 2010-08-18 DIAGNOSIS — E7800 Pure hypercholesterolemia, unspecified: Secondary | ICD-10-CM | POA: Insufficient documentation

## 2010-08-18 DIAGNOSIS — Z79899 Other long term (current) drug therapy: Secondary | ICD-10-CM | POA: Insufficient documentation

## 2010-08-18 DIAGNOSIS — F329 Major depressive disorder, single episode, unspecified: Secondary | ICD-10-CM | POA: Insufficient documentation

## 2010-08-18 DIAGNOSIS — G4700 Insomnia, unspecified: Secondary | ICD-10-CM | POA: Insufficient documentation

## 2010-08-18 DIAGNOSIS — I509 Heart failure, unspecified: Secondary | ICD-10-CM | POA: Insufficient documentation

## 2010-08-21 ENCOUNTER — Other Ambulatory Visit: Payer: Self-pay | Admitting: Oncology

## 2010-08-21 ENCOUNTER — Encounter (HOSPITAL_BASED_OUTPATIENT_CLINIC_OR_DEPARTMENT_OTHER): Payer: PRIVATE HEALTH INSURANCE | Admitting: Oncology

## 2010-08-21 DIAGNOSIS — D0590 Unspecified type of carcinoma in situ of unspecified breast: Secondary | ICD-10-CM

## 2010-08-21 DIAGNOSIS — Z5111 Encounter for antineoplastic chemotherapy: Secondary | ICD-10-CM

## 2010-08-21 DIAGNOSIS — C50919 Malignant neoplasm of unspecified site of unspecified female breast: Secondary | ICD-10-CM

## 2010-08-21 LAB — CBC WITH DIFFERENTIAL/PLATELET
BASO%: 0 % (ref 0.0–2.0)
Basophils Absolute: 0 10*3/uL (ref 0.0–0.1)
EOS%: 0 % (ref 0.0–7.0)
Eosinophils Absolute: 0 10*3/uL (ref 0.0–0.5)
HCT: 36.1 % (ref 34.8–46.6)
HGB: 12 g/dL (ref 11.6–15.9)
LYMPH%: 9.4 % — ABNORMAL LOW (ref 14.0–49.7)
MCH: 31.7 pg (ref 25.1–34.0)
MCHC: 33.2 g/dL (ref 31.5–36.0)
MCV: 95.3 fL (ref 79.5–101.0)
MONO#: 0.3 10*3/uL (ref 0.1–0.9)
MONO%: 3.1 % (ref 0.0–14.0)
NEUT#: 8.7 10*3/uL — ABNORMAL HIGH (ref 1.5–6.5)
NEUT%: 87.5 % — ABNORMAL HIGH (ref 38.4–76.8)
Platelets: 369 10*3/uL (ref 145–400)
RBC: 3.79 10*6/uL (ref 3.70–5.45)
RDW: 14.9 % — ABNORMAL HIGH (ref 11.2–14.5)
WBC: 9.9 10*3/uL (ref 3.9–10.3)
lymph#: 0.9 10*3/uL (ref 0.9–3.3)
nRBC: 0 % (ref 0–0)

## 2010-08-21 LAB — COMPREHENSIVE METABOLIC PANEL
ALT: 12 U/L (ref 0–35)
AST: 13 U/L (ref 0–37)
Albumin: 4 g/dL (ref 3.5–5.2)
Alkaline Phosphatase: 105 U/L (ref 39–117)
BUN: 18 mg/dL (ref 6–23)
CO2: 22 mEq/L (ref 19–32)
Calcium: 8.9 mg/dL (ref 8.4–10.5)
Chloride: 104 mEq/L (ref 96–112)
Creatinine, Ser: 0.96 mg/dL (ref 0.40–1.20)
Glucose, Bld: 131 mg/dL — ABNORMAL HIGH (ref 70–99)
Potassium: 4 mEq/L (ref 3.5–5.3)
Sodium: 138 mEq/L (ref 135–145)
Total Bilirubin: 0.3 mg/dL (ref 0.3–1.2)
Total Protein: 7.1 g/dL (ref 6.0–8.3)

## 2010-08-22 ENCOUNTER — Encounter (HOSPITAL_BASED_OUTPATIENT_CLINIC_OR_DEPARTMENT_OTHER): Payer: PRIVATE HEALTH INSURANCE | Admitting: Oncology

## 2010-08-22 DIAGNOSIS — D0590 Unspecified type of carcinoma in situ of unspecified breast: Secondary | ICD-10-CM

## 2010-08-22 DIAGNOSIS — Z5189 Encounter for other specified aftercare: Secondary | ICD-10-CM

## 2010-08-25 ENCOUNTER — Ambulatory Visit: Payer: PRIVATE HEALTH INSURANCE | Admitting: Psychiatry

## 2010-08-28 ENCOUNTER — Other Ambulatory Visit: Payer: Self-pay | Admitting: Oncology

## 2010-08-28 ENCOUNTER — Encounter (HOSPITAL_BASED_OUTPATIENT_CLINIC_OR_DEPARTMENT_OTHER): Payer: PRIVATE HEALTH INSURANCE | Admitting: Oncology

## 2010-08-28 DIAGNOSIS — F329 Major depressive disorder, single episode, unspecified: Secondary | ICD-10-CM

## 2010-08-28 DIAGNOSIS — G40909 Epilepsy, unspecified, not intractable, without status epilepticus: Secondary | ICD-10-CM

## 2010-08-28 DIAGNOSIS — F3289 Other specified depressive episodes: Secondary | ICD-10-CM

## 2010-08-28 DIAGNOSIS — B370 Candidal stomatitis: Secondary | ICD-10-CM

## 2010-08-28 DIAGNOSIS — D0590 Unspecified type of carcinoma in situ of unspecified breast: Secondary | ICD-10-CM

## 2010-08-28 LAB — CBC WITH DIFFERENTIAL/PLATELET
BASO%: 0.2 % (ref 0.0–2.0)
Basophils Absolute: 0 10*3/uL (ref 0.0–0.1)
EOS%: 3.1 % (ref 0.0–7.0)
Eosinophils Absolute: 0.2 10*3/uL (ref 0.0–0.5)
HCT: 35 % (ref 34.8–46.6)
HGB: 11.6 g/dL (ref 11.6–15.9)
LYMPH%: 14.1 % (ref 14.0–49.7)
MCH: 32.3 pg (ref 25.1–34.0)
MCHC: 33.3 g/dL (ref 31.5–36.0)
MCV: 97.2 fL (ref 79.5–101.0)
MONO#: 0.9 10*3/uL (ref 0.1–0.9)
MONO%: 10.7 % (ref 0.0–14.0)
NEUT#: 5.8 10*3/uL (ref 1.5–6.5)
NEUT%: 71.9 % (ref 38.4–76.8)
Platelets: 269 10*3/uL (ref 145–400)
RBC: 3.6 10*6/uL — ABNORMAL LOW (ref 3.70–5.45)
RDW: 14.9 % — ABNORMAL HIGH (ref 11.2–14.5)
WBC: 8 10*3/uL (ref 3.9–10.3)
lymph#: 1.1 10*3/uL (ref 0.9–3.3)

## 2010-08-28 LAB — BASIC METABOLIC PANEL
BUN: 17 mg/dL (ref 6–23)
CO2: 23 mEq/L (ref 19–32)
Calcium: 8.7 mg/dL (ref 8.4–10.5)
Chloride: 105 mEq/L (ref 96–112)
Creatinine, Ser: 1.18 mg/dL (ref 0.40–1.20)
Glucose, Bld: 222 mg/dL — ABNORMAL HIGH (ref 70–99)
Potassium: 3.8 mEq/L (ref 3.5–5.3)
Sodium: 139 mEq/L (ref 135–145)

## 2010-09-01 LAB — GLUCOSE, CAPILLARY
Glucose-Capillary: 110 mg/dL — ABNORMAL HIGH (ref 70–99)
Glucose-Capillary: 128 mg/dL — ABNORMAL HIGH (ref 70–99)
Glucose-Capillary: 131 mg/dL — ABNORMAL HIGH (ref 70–99)
Glucose-Capillary: 141 mg/dL — ABNORMAL HIGH (ref 70–99)
Glucose-Capillary: 156 mg/dL — ABNORMAL HIGH (ref 70–99)
Glucose-Capillary: 101 mg/dL — ABNORMAL HIGH (ref 70–99)
Glucose-Capillary: 105 mg/dL — ABNORMAL HIGH (ref 70–99)
Glucose-Capillary: 109 mg/dL — ABNORMAL HIGH (ref 70–99)
Glucose-Capillary: 110 mg/dL — ABNORMAL HIGH (ref 70–99)
Glucose-Capillary: 111 mg/dL — ABNORMAL HIGH (ref 70–99)
Glucose-Capillary: 114 mg/dL — ABNORMAL HIGH (ref 70–99)
Glucose-Capillary: 117 mg/dL — ABNORMAL HIGH (ref 70–99)
Glucose-Capillary: 118 mg/dL — ABNORMAL HIGH (ref 70–99)
Glucose-Capillary: 121 mg/dL — ABNORMAL HIGH (ref 70–99)
Glucose-Capillary: 131 mg/dL — ABNORMAL HIGH (ref 70–99)
Glucose-Capillary: 134 mg/dL — ABNORMAL HIGH (ref 70–99)
Glucose-Capillary: 136 mg/dL — ABNORMAL HIGH (ref 70–99)
Glucose-Capillary: 137 mg/dL — ABNORMAL HIGH (ref 70–99)
Glucose-Capillary: 140 mg/dL — ABNORMAL HIGH (ref 70–99)
Glucose-Capillary: 140 mg/dL — ABNORMAL HIGH (ref 70–99)
Glucose-Capillary: 147 mg/dL — ABNORMAL HIGH (ref 70–99)
Glucose-Capillary: 151 mg/dL — ABNORMAL HIGH (ref 70–99)
Glucose-Capillary: 159 mg/dL — ABNORMAL HIGH (ref 70–99)
Glucose-Capillary: 164 mg/dL — ABNORMAL HIGH (ref 70–99)
Glucose-Capillary: 174 mg/dL — ABNORMAL HIGH (ref 70–99)
Glucose-Capillary: 177 mg/dL — ABNORMAL HIGH (ref 70–99)
Glucose-Capillary: 194 mg/dL — ABNORMAL HIGH (ref 70–99)
Glucose-Capillary: 231 mg/dL — ABNORMAL HIGH (ref 70–99)
Glucose-Capillary: 95 mg/dL (ref 70–99)

## 2010-09-01 LAB — BASIC METABOLIC PANEL
BUN: 27 mg/dL — ABNORMAL HIGH (ref 6–23)
BUN: 31 mg/dL — ABNORMAL HIGH (ref 6–23)
CO2: 23 mEq/L (ref 19–32)
CO2: 25 mEq/L (ref 19–32)
Calcium: 8.3 mg/dL — ABNORMAL LOW (ref 8.4–10.5)
Calcium: 8.9 mg/dL (ref 8.4–10.5)
Chloride: 105 mEq/L (ref 96–112)
Chloride: 106 mEq/L (ref 96–112)
Creatinine, Ser: 1.14 mg/dL (ref 0.4–1.2)
Creatinine, Ser: 1.35 mg/dL — ABNORMAL HIGH (ref 0.4–1.2)
GFR calc Af Amer: 49 mL/min — ABNORMAL LOW (ref >60)
GFR calc Af Amer: 60 mL/min — ABNORMAL LOW (ref >60)
GFR calc non Af Amer: 41 mL/min — ABNORMAL LOW (ref >60)
GFR calc non Af Amer: 49 mL/min — ABNORMAL LOW (ref >60)
Glucose, Bld: 128 mg/dL — ABNORMAL HIGH (ref 70–99)
Glucose, Bld: 171 mg/dL — ABNORMAL HIGH (ref 70–99)
Potassium: 3.7 mEq/L (ref 3.5–5.1)
Potassium: 3.9 mEq/L (ref 3.5–5.1)
Sodium: 137 mEq/L (ref 135–145)
Sodium: 137 mEq/L (ref 135–145)

## 2010-09-01 LAB — DIFFERENTIAL
Basophils Absolute: 0 10*3/uL (ref 0.0–0.1)
Basophils Absolute: 0 10*3/uL (ref 0.0–0.1)
Basophils Relative: 0 % (ref 0–1)
Basophils Relative: 0 % (ref 0–1)
Eosinophils Absolute: 0 10*3/uL (ref 0.0–0.7)
Eosinophils Absolute: 0.2 10*3/uL (ref 0.0–0.7)
Eosinophils Relative: 0 % (ref 0–5)
Eosinophils Relative: 2 % (ref 0–5)
Lymphocytes Relative: 25 % (ref 12–46)
Lymphocytes Relative: 6 % — ABNORMAL LOW (ref 12–46)
Lymphs Abs: 0.6 10*3/uL — ABNORMAL LOW (ref 0.7–4.0)
Lymphs Abs: 2.7 10*3/uL (ref 0.7–4.0)
Monocytes Absolute: 0 10*3/uL — ABNORMAL LOW (ref 0.1–1.0)
Monocytes Absolute: 0.6 10*3/uL (ref 0.1–1.0)
Monocytes Relative: 1 % — ABNORMAL LOW (ref 3–12)
Monocytes Relative: 5 % (ref 3–12)
Neutro Abs: 7.2 10*3/uL (ref 1.7–7.7)
Neutro Abs: 9.2 10*3/uL — ABNORMAL HIGH (ref 1.7–7.7)
Neutrophils Relative %: 67 % (ref 43–77)
Neutrophils Relative %: 94 % — ABNORMAL HIGH (ref 43–77)

## 2010-09-01 LAB — COMPREHENSIVE METABOLIC PANEL
ALT: 15 U/L (ref 0–35)
ALT: 17 U/L (ref 0–35)
AST: 18 U/L (ref 0–37)
AST: 20 U/L (ref 0–37)
Albumin: 3.2 g/dL — ABNORMAL LOW (ref 3.5–5.2)
Albumin: 3.4 g/dL — ABNORMAL LOW (ref 3.5–5.2)
Alkaline Phosphatase: 116 U/L (ref 39–117)
Alkaline Phosphatase: 136 U/L — ABNORMAL HIGH (ref 39–117)
BUN: 41 mg/dL — ABNORMAL HIGH (ref 6–23)
BUN: 50 mg/dL — ABNORMAL HIGH (ref 6–23)
CO2: 23 mEq/L (ref 19–32)
CO2: 25 mEq/L (ref 19–32)
Calcium: 9.1 mg/dL (ref 8.4–10.5)
Calcium: 9.5 mg/dL (ref 8.4–10.5)
Chloride: 103 mEq/L (ref 96–112)
Chloride: 107 mEq/L (ref 96–112)
Creatinine, Ser: 1.72 mg/dL — ABNORMAL HIGH (ref 0.4–1.2)
Creatinine, Ser: 2.32 mg/dL — ABNORMAL HIGH (ref 0.4–1.2)
GFR calc Af Amer: 26 mL/min — ABNORMAL LOW (ref >60)
GFR calc Af Amer: 37 mL/min — ABNORMAL LOW (ref >60)
GFR calc non Af Amer: 22 mL/min — ABNORMAL LOW (ref >60)
GFR calc non Af Amer: 31 mL/min — ABNORMAL LOW (ref >60)
Glucose, Bld: 125 mg/dL — ABNORMAL HIGH (ref 70–99)
Glucose, Bld: 265 mg/dL — ABNORMAL HIGH (ref 70–99)
Potassium: 3.7 mEq/L (ref 3.5–5.1)
Potassium: 3.8 mEq/L (ref 3.5–5.1)
Sodium: 138 mEq/L (ref 135–145)
Sodium: 142 mEq/L (ref 135–145)
Total Bilirubin: 0.4 mg/dL (ref 0.3–1.2)
Total Bilirubin: 1 mg/dL (ref 0.3–1.2)
Total Protein: 7.1 g/dL (ref 6.0–8.3)
Total Protein: 7.7 g/dL (ref 6.0–8.3)

## 2010-09-01 LAB — CARDIAC PANEL(CRET KIN+CKTOT+MB+TROPI)
CK, MB: 1 ng/mL (ref 0.3–4.0)
CK, MB: 1.1 ng/mL (ref 0.3–4.0)
Relative Index: INVALID (ref 0.0–2.5)
Relative Index: INVALID (ref 0.0–2.5)
Total CK: 61 U/L (ref 7–177)
Total CK: 65 U/L (ref 7–177)
Troponin I: 0.01 ng/mL (ref 0.00–0.06)
Troponin I: 0.02 ng/mL (ref 0.00–0.06)

## 2010-09-01 LAB — CK TOTAL AND CKMB (NOT AT ARMC)
CK, MB: 1.2 ng/mL (ref 0.3–4.0)
Relative Index: INVALID (ref 0.0–2.5)
Total CK: 70 U/L (ref 7–177)

## 2010-09-01 LAB — CBC
HCT: 32.7 % — ABNORMAL LOW (ref 36.0–46.0)
HCT: 36.9 % (ref 36.0–46.0)
HCT: 38.5 % (ref 36.0–46.0)
Hemoglobin: 11.1 g/dL — ABNORMAL LOW (ref 12.0–15.0)
Hemoglobin: 12.3 g/dL (ref 12.0–15.0)
Hemoglobin: 13.2 g/dL (ref 12.0–15.0)
MCHC: 34.1 g/dL (ref 30.0–36.0)
MCHC: 33.4 g/dL (ref 30.0–36.0)
MCHC: 34.3 g/dL (ref 30.0–36.0)
MCV: 95 fL (ref 78.0–100.0)
MCV: 93.8 fL (ref 78.0–100.0)
MCV: 94.7 fL (ref 78.0–100.0)
Platelets: 306 10*3/uL (ref 150–400)
Platelets: 321 10*3/uL (ref 150–400)
Platelets: 342 10*3/uL (ref 150–400)
RBC: 3.44 MIL/uL — ABNORMAL LOW (ref 3.87–5.11)
RBC: 3.9 MIL/uL (ref 3.87–5.11)
RBC: 4.1 MIL/uL (ref 3.87–5.11)
RDW: 15.8 % — ABNORMAL HIGH (ref 11.5–15.5)
RDW: 15.6 % — ABNORMAL HIGH (ref 11.5–15.5)
RDW: 15.7 % — ABNORMAL HIGH (ref 11.5–15.5)
WBC: 7.9 10*3/uL (ref 4.0–10.5)
WBC: 10.7 10*3/uL — ABNORMAL HIGH (ref 4.0–10.5)
WBC: 9.9 10*3/uL (ref 4.0–10.5)

## 2010-09-01 LAB — URINALYSIS, ROUTINE W REFLEX MICROSCOPIC
Bilirubin Urine: NEGATIVE
Glucose, UA: NEGATIVE mg/dL
Hgb urine dipstick: NEGATIVE
Ketones, ur: NEGATIVE mg/dL
Nitrite: NEGATIVE
Protein, ur: NEGATIVE mg/dL
Specific Gravity, Urine: 1.015 (ref 1.005–1.030)
Urobilinogen, UA: 0.2 mg/dL (ref 0.0–1.0)
pH: 5.5 (ref 5.0–8.0)

## 2010-09-01 LAB — BASIC METABOLIC PANEL WITH GFR
BUN: 27 mg/dL — ABNORMAL HIGH (ref 6–23)
CO2: 25 meq/L (ref 19–32)
Calcium: 8.7 mg/dL (ref 8.4–10.5)
Chloride: 108 meq/L (ref 96–112)
Creatinine, Ser: 1.14 mg/dL (ref 0.4–1.2)
GFR calc non Af Amer: 49 mL/min — ABNORMAL LOW
Glucose, Bld: 134 mg/dL — ABNORMAL HIGH (ref 70–99)
Potassium: 4.2 meq/L (ref 3.5–5.1)
Sodium: 139 meq/L (ref 135–145)

## 2010-09-01 LAB — URINE MICROSCOPIC-ADD ON

## 2010-09-01 LAB — LIPID PANEL
Cholesterol: 216 mg/dL — ABNORMAL HIGH (ref 0–200)
HDL: 37 mg/dL — ABNORMAL LOW (ref >39)
LDL Cholesterol: 161 mg/dL — ABNORMAL HIGH (ref 0–99)
Total CHOL/HDL Ratio: 5.8 RATIO
Triglycerides: 88 mg/dL (ref <150)
VLDL: 18 mg/dL (ref 0–40)

## 2010-09-01 LAB — PROTIME-INR
INR: 1 (ref 0.00–1.49)
INR: 1 (ref 0.00–1.49)
Prothrombin Time: 13.9 seconds (ref 11.6–15.2)
Prothrombin Time: 13.5 seconds (ref 11.6–15.2)

## 2010-09-01 LAB — APTT
aPTT: 32 seconds (ref 24–37)
aPTT: 27 seconds (ref 24–37)

## 2010-09-01 LAB — BLOOD GAS, ARTERIAL
Acid-base deficit: 1.5 mmol/L (ref 0.0–2.0)
Bicarbonate: 22.4 mEq/L (ref 20.0–24.0)
Drawn by: 276051
FIO2: 0.21 %
O2 Saturation: 94.4 %
Patient temperature: 98.6
TCO2: 20 mmol/L (ref 0–100)
pCO2 arterial: 37 mmHg (ref 35.0–45.0)
pH, Arterial: 7.4 (ref 7.350–7.400)
pO2, Arterial: 75 mmHg — ABNORMAL LOW (ref 80.0–100.0)

## 2010-09-01 LAB — POCT CARDIAC MARKERS
CKMB, poc: 1.2 ng/mL (ref 1.0–8.0)
Myoglobin, poc: 157 ng/mL (ref 12–200)
Troponin i, poc: <0.05 ng/mL (ref 0.00–0.09)

## 2010-09-01 LAB — BRAIN NATRIURETIC PEPTIDE
Pro B Natriuretic peptide (BNP): <30 pg/mL (ref 0.0–100.0)
Pro B Natriuretic peptide (BNP): <30 pg/mL (ref 0.0–100.0)

## 2010-09-01 LAB — HEMOGLOBIN A1C
Hgb A1c MFr Bld: 6.8 % — ABNORMAL HIGH (ref 4.6–6.1)
Mean Plasma Glucose: 148 mg/dL

## 2010-09-01 LAB — TSH: TSH: 0.372 u[IU]/mL (ref 0.350–4.500)

## 2010-09-01 LAB — TROPONIN I: Troponin I: 0.01 ng/mL (ref 0.00–0.06)

## 2010-09-03 ENCOUNTER — Ambulatory Visit (INDEPENDENT_AMBULATORY_CARE_PROVIDER_SITE_OTHER): Payer: PRIVATE HEALTH INSURANCE | Admitting: Psychiatry

## 2010-09-03 ENCOUNTER — Ambulatory Visit: Payer: Medicare Other | Attending: Radiation Oncology | Admitting: Radiation Oncology

## 2010-09-03 DIAGNOSIS — C50919 Malignant neoplasm of unspecified site of unspecified female breast: Secondary | ICD-10-CM | POA: Insufficient documentation

## 2010-09-03 DIAGNOSIS — Z638 Other specified problems related to primary support group: Secondary | ICD-10-CM

## 2010-09-03 DIAGNOSIS — F0630 Mood disorder due to known physiological condition, unspecified: Secondary | ICD-10-CM

## 2010-09-03 LAB — URINE MICROSCOPIC-ADD ON

## 2010-09-03 LAB — BASIC METABOLIC PANEL
BUN: 16 mg/dL (ref 6–23)
BUN: 20 mg/dL (ref 6–23)
CO2: 28 mEq/L (ref 19–32)
CO2: 28 mEq/L (ref 19–32)
Calcium: 8.3 mg/dL — ABNORMAL LOW (ref 8.4–10.5)
Calcium: 8.8 mg/dL (ref 8.4–10.5)
Chloride: 102 mEq/L (ref 96–112)
Chloride: 104 mEq/L (ref 96–112)
Creatinine, Ser: 1.19 mg/dL (ref 0.4–1.2)
Creatinine, Ser: 1.43 mg/dL — ABNORMAL HIGH (ref 0.4–1.2)
GFR calc Af Amer: 46 mL/min — ABNORMAL LOW (ref >60)
GFR calc Af Amer: 57 mL/min — ABNORMAL LOW (ref >60)
GFR calc non Af Amer: 38 mL/min — ABNORMAL LOW (ref >60)
GFR calc non Af Amer: 47 mL/min — ABNORMAL LOW (ref >60)
Glucose, Bld: 86 mg/dL (ref 70–99)
Glucose, Bld: 89 mg/dL (ref 70–99)
Potassium: 3.8 mEq/L (ref 3.5–5.1)
Potassium: 4.2 mEq/L (ref 3.5–5.1)
Sodium: 138 mEq/L (ref 135–145)
Sodium: 139 mEq/L (ref 135–145)

## 2010-09-03 LAB — LIPID PANEL
Cholesterol: 159 mg/dL (ref 0–200)
HDL: 41 mg/dL (ref >39)
LDL Cholesterol: 110 mg/dL — ABNORMAL HIGH (ref 0–99)
Total CHOL/HDL Ratio: 3.9 RATIO
Triglycerides: 41 mg/dL (ref <150)
VLDL: 8 mg/dL (ref 0–40)

## 2010-09-03 LAB — CBC
HCT: 29.7 % — ABNORMAL LOW (ref 36.0–46.0)
HCT: 31 % — ABNORMAL LOW (ref 36.0–46.0)
HCT: 33 % — ABNORMAL LOW (ref 36.0–46.0)
Hemoglobin: 10 g/dL — ABNORMAL LOW (ref 12.0–15.0)
Hemoglobin: 10.4 g/dL — ABNORMAL LOW (ref 12.0–15.0)
Hemoglobin: 11.2 g/dL — ABNORMAL LOW (ref 12.0–15.0)
MCHC: 33.4 g/dL (ref 30.0–36.0)
MCHC: 33.6 g/dL (ref 30.0–36.0)
MCHC: 33.9 g/dL (ref 30.0–36.0)
MCV: 93.4 fL (ref 78.0–100.0)
MCV: 94.8 fL (ref 78.0–100.0)
MCV: 95.3 fL (ref 78.0–100.0)
Platelets: 299 10*3/uL (ref 150–400)
Platelets: 314 10*3/uL (ref 150–400)
Platelets: 363 10*3/uL (ref 150–400)
RBC: 3.13 MIL/uL — ABNORMAL LOW (ref 3.87–5.11)
RBC: 3.25 MIL/uL — ABNORMAL LOW (ref 3.87–5.11)
RBC: 3.53 MIL/uL — ABNORMAL LOW (ref 3.87–5.11)
RDW: 16.2 % — ABNORMAL HIGH (ref 11.5–15.5)
RDW: 16.4 % — ABNORMAL HIGH (ref 11.5–15.5)
RDW: 16.7 % — ABNORMAL HIGH (ref 11.5–15.5)
WBC: 11.6 10*3/uL — ABNORMAL HIGH (ref 4.0–10.5)
WBC: 8.5 10*3/uL (ref 4.0–10.5)
WBC: 9.6 10*3/uL (ref 4.0–10.5)

## 2010-09-03 LAB — APTT: aPTT: 35 seconds (ref 24–37)

## 2010-09-03 LAB — CARDIAC PANEL(CRET KIN+CKTOT+MB+TROPI)
CK, MB: 1.1 ng/mL (ref 0.3–4.0)
CK, MB: 1.2 ng/mL (ref 0.3–4.0)
CK, MB: 1.5 ng/mL (ref 0.3–4.0)
Relative Index: INVALID (ref 0.0–2.5)
Relative Index: INVALID (ref 0.0–2.5)
Relative Index: INVALID (ref 0.0–2.5)
Total CK: 71 U/L (ref 7–177)
Total CK: 72 U/L (ref 7–177)
Total CK: 79 U/L (ref 7–177)
Troponin I: 0.02 ng/mL (ref 0.00–0.06)
Troponin I: <0.01 ng/mL (ref 0.00–0.06)
Troponin I: <0.01 ng/mL (ref 0.00–0.06)

## 2010-09-03 LAB — COMPREHENSIVE METABOLIC PANEL
ALT: 28 U/L (ref 0–35)
AST: 26 U/L (ref 0–37)
Albumin: 3 g/dL — ABNORMAL LOW (ref 3.5–5.2)
Alkaline Phosphatase: 153 U/L — ABNORMAL HIGH (ref 39–117)
BUN: 18 mg/dL (ref 6–23)
CO2: 26 mEq/L (ref 19–32)
Calcium: 8.6 mg/dL (ref 8.4–10.5)
Chloride: 104 mEq/L (ref 96–112)
Creatinine, Ser: 1.3 mg/dL — ABNORMAL HIGH (ref 0.4–1.2)
GFR calc Af Amer: 51 mL/min — ABNORMAL LOW (ref >60)
GFR calc non Af Amer: 43 mL/min — ABNORMAL LOW (ref >60)
Glucose, Bld: 88 mg/dL (ref 70–99)
Potassium: 4.1 mEq/L (ref 3.5–5.1)
Sodium: 136 mEq/L (ref 135–145)
Total Bilirubin: 0.6 mg/dL (ref 0.3–1.2)
Total Protein: 6.9 g/dL (ref 6.0–8.3)

## 2010-09-03 LAB — POCT CARDIAC MARKERS
CKMB, poc: <1 ng/mL — ABNORMAL LOW (ref 1.0–8.0)
Myoglobin, poc: 84 ng/mL (ref 12–200)
Troponin i, poc: <0.05 ng/mL (ref 0.00–0.09)

## 2010-09-03 LAB — URINALYSIS, ROUTINE W REFLEX MICROSCOPIC
Bilirubin Urine: NEGATIVE
Glucose, UA: NEGATIVE mg/dL
Hgb urine dipstick: NEGATIVE
Ketones, ur: NEGATIVE mg/dL
Nitrite: NEGATIVE
Protein, ur: NEGATIVE mg/dL
Specific Gravity, Urine: 1.025 (ref 1.005–1.030)
Urobilinogen, UA: 1 mg/dL (ref 0.0–1.0)
pH: 7.5 (ref 5.0–8.0)

## 2010-09-03 LAB — DIFFERENTIAL
Basophils Absolute: 0 10*3/uL (ref 0.0–0.1)
Basophils Relative: 0 % (ref 0–1)
Eosinophils Absolute: 0.4 10*3/uL (ref 0.0–0.7)
Eosinophils Relative: 4 % (ref 0–5)
Lymphocytes Relative: 11 % — ABNORMAL LOW (ref 12–46)
Lymphs Abs: 1.3 10*3/uL (ref 0.7–4.0)
Monocytes Absolute: 0.4 10*3/uL (ref 0.1–1.0)
Monocytes Relative: 3 % (ref 3–12)
Neutro Abs: 9.4 10*3/uL — ABNORMAL HIGH (ref 1.7–7.7)
Neutrophils Relative %: 81 % — ABNORMAL HIGH (ref 43–77)

## 2010-09-03 LAB — GLUCOSE, CAPILLARY
Glucose-Capillary: 106 mg/dL — ABNORMAL HIGH (ref 70–99)
Glucose-Capillary: 109 mg/dL — ABNORMAL HIGH (ref 70–99)
Glucose-Capillary: 110 mg/dL — ABNORMAL HIGH (ref 70–99)
Glucose-Capillary: 112 mg/dL — ABNORMAL HIGH (ref 70–99)
Glucose-Capillary: 75 mg/dL (ref 70–99)
Glucose-Capillary: 88 mg/dL (ref 70–99)
Glucose-Capillary: 89 mg/dL (ref 70–99)

## 2010-09-03 LAB — BRAIN NATRIURETIC PEPTIDE
Pro B Natriuretic peptide (BNP): 166 pg/mL — ABNORMAL HIGH (ref 0.0–100.0)
Pro B Natriuretic peptide (BNP): 74 pg/mL (ref 0.0–100.0)

## 2010-09-03 LAB — URINE CULTURE: Colony Count: 60000

## 2010-09-03 LAB — D-DIMER, QUANTITATIVE (NOT AT ARMC): D-Dimer, Quant: 1 ug/mL-FEU — ABNORMAL HIGH (ref 0.00–0.48)

## 2010-09-03 LAB — PROTIME-INR
INR: 1 (ref 0.00–1.49)
Prothrombin Time: 13.7 seconds (ref 11.6–15.2)

## 2010-09-03 LAB — TSH: TSH: 0.781 u[IU]/mL (ref 0.350–4.500)

## 2010-09-11 ENCOUNTER — Encounter (HOSPITAL_BASED_OUTPATIENT_CLINIC_OR_DEPARTMENT_OTHER): Payer: PRIVATE HEALTH INSURANCE | Admitting: Oncology

## 2010-09-11 ENCOUNTER — Other Ambulatory Visit: Payer: Self-pay | Admitting: Oncology

## 2010-09-11 DIAGNOSIS — C50919 Malignant neoplasm of unspecified site of unspecified female breast: Secondary | ICD-10-CM

## 2010-09-11 DIAGNOSIS — Z5111 Encounter for antineoplastic chemotherapy: Secondary | ICD-10-CM

## 2010-09-11 DIAGNOSIS — D0590 Unspecified type of carcinoma in situ of unspecified breast: Secondary | ICD-10-CM

## 2010-09-11 LAB — COMPREHENSIVE METABOLIC PANEL
ALT: 21 U/L (ref 0–35)
AST: 17 U/L (ref 0–37)
Albumin: 3.9 g/dL (ref 3.5–5.2)
Alkaline Phosphatase: 100 U/L (ref 39–117)
BUN: 28 mg/dL — ABNORMAL HIGH (ref 6–23)
CO2: 22 mEq/L (ref 19–32)
Calcium: 9.7 mg/dL (ref 8.4–10.5)
Chloride: 103 mEq/L (ref 96–112)
Creatinine, Ser: 1.03 mg/dL (ref 0.40–1.20)
Glucose, Bld: 185 mg/dL — ABNORMAL HIGH (ref 70–99)
Potassium: 4.2 mEq/L (ref 3.5–5.3)
Sodium: 139 mEq/L (ref 135–145)
Total Bilirubin: 0.3 mg/dL (ref 0.3–1.2)
Total Protein: 6.8 g/dL (ref 6.0–8.3)

## 2010-09-11 LAB — CBC WITH DIFFERENTIAL/PLATELET
BASO%: 0.1 % (ref 0.0–2.0)
Basophils Absolute: 0 10*3/uL (ref 0.0–0.1)
EOS%: 0 % (ref 0.0–7.0)
Eosinophils Absolute: 0 10*3/uL (ref 0.0–0.5)
HCT: 31.9 % — ABNORMAL LOW (ref 34.8–46.6)
HGB: 10.6 g/dL — ABNORMAL LOW (ref 11.6–15.9)
LYMPH%: 8.8 % — ABNORMAL LOW (ref 14.0–49.7)
MCH: 31.7 pg (ref 25.1–34.0)
MCHC: 33.2 g/dL (ref 31.5–36.0)
MCV: 95.5 fL (ref 79.5–101.0)
MONO#: 0.5 10*3/uL (ref 0.1–0.9)
MONO%: 4 % (ref 0.0–14.0)
NEUT#: 11.7 10*3/uL — ABNORMAL HIGH (ref 1.5–6.5)
NEUT%: 87.1 % — ABNORMAL HIGH (ref 38.4–76.8)
Platelets: 337 10*3/uL (ref 145–400)
RBC: 3.34 10*6/uL — ABNORMAL LOW (ref 3.70–5.45)
RDW: 14.9 % — ABNORMAL HIGH (ref 11.2–14.5)
WBC: 13.5 10*3/uL — ABNORMAL HIGH (ref 3.9–10.3)
lymph#: 1.2 10*3/uL (ref 0.9–3.3)
nRBC: 0 % (ref 0–0)

## 2010-09-12 ENCOUNTER — Encounter (HOSPITAL_BASED_OUTPATIENT_CLINIC_OR_DEPARTMENT_OTHER): Payer: PRIVATE HEALTH INSURANCE | Admitting: Oncology

## 2010-09-12 DIAGNOSIS — C50919 Malignant neoplasm of unspecified site of unspecified female breast: Secondary | ICD-10-CM

## 2010-09-12 DIAGNOSIS — Z5189 Encounter for other specified aftercare: Secondary | ICD-10-CM

## 2010-09-17 ENCOUNTER — Ambulatory Visit (INDEPENDENT_AMBULATORY_CARE_PROVIDER_SITE_OTHER): Payer: PRIVATE HEALTH INSURANCE | Admitting: Psychiatry

## 2010-09-17 DIAGNOSIS — Z638 Other specified problems related to primary support group: Secondary | ICD-10-CM

## 2010-09-17 DIAGNOSIS — F0630 Mood disorder due to known physiological condition, unspecified: Secondary | ICD-10-CM

## 2010-09-18 ENCOUNTER — Other Ambulatory Visit: Payer: Self-pay | Admitting: Oncology

## 2010-09-18 ENCOUNTER — Encounter (HOSPITAL_BASED_OUTPATIENT_CLINIC_OR_DEPARTMENT_OTHER): Payer: PRIVATE HEALTH INSURANCE | Admitting: Oncology

## 2010-09-18 DIAGNOSIS — R5383 Other fatigue: Secondary | ICD-10-CM

## 2010-09-18 DIAGNOSIS — D0590 Unspecified type of carcinoma in situ of unspecified breast: Secondary | ICD-10-CM

## 2010-09-18 DIAGNOSIS — R000 Tachycardia, unspecified: Secondary | ICD-10-CM

## 2010-09-18 DIAGNOSIS — C50919 Malignant neoplasm of unspecified site of unspecified female breast: Secondary | ICD-10-CM

## 2010-09-18 DIAGNOSIS — R5381 Other malaise: Secondary | ICD-10-CM

## 2010-09-18 DIAGNOSIS — R197 Diarrhea, unspecified: Secondary | ICD-10-CM

## 2010-09-18 LAB — CBC WITH DIFFERENTIAL/PLATELET
BASO%: 0.4 % (ref 0.0–2.0)
Basophils Absolute: 0 10*3/uL (ref 0.0–0.1)
EOS%: 2.3 % (ref 0.0–7.0)
Eosinophils Absolute: 0.1 10*3/uL (ref 0.0–0.5)
HCT: 30.1 % — ABNORMAL LOW (ref 34.8–46.6)
HGB: 10.1 g/dL — ABNORMAL LOW (ref 11.6–15.9)
LYMPH%: 16.8 % (ref 14.0–49.7)
MCH: 32.9 pg (ref 25.1–34.0)
MCHC: 33.5 g/dL (ref 31.5–36.0)
MCV: 98.4 fL (ref 79.5–101.0)
MONO#: 1 10*3/uL — ABNORMAL HIGH (ref 0.1–0.9)
MONO%: 16.5 % — ABNORMAL HIGH (ref 0.0–14.0)
NEUT#: 3.9 10*3/uL (ref 1.5–6.5)
NEUT%: 64 % (ref 38.4–76.8)
Platelets: 210 10*3/uL (ref 145–400)
RBC: 3.07 10*6/uL — ABNORMAL LOW (ref 3.70–5.45)
RDW: 15.9 % — ABNORMAL HIGH (ref 11.2–14.5)
WBC: 6.1 10*3/uL (ref 3.9–10.3)
lymph#: 1 10*3/uL (ref 0.9–3.3)

## 2010-09-18 LAB — BASIC METABOLIC PANEL
BUN: 21 mg/dL (ref 6–23)
CO2: 27 mEq/L (ref 19–32)
Calcium: 9.3 mg/dL (ref 8.4–10.5)
Chloride: 103 mEq/L (ref 96–112)
Creatinine, Ser: 1.15 mg/dL (ref 0.40–1.20)
Glucose, Bld: 116 mg/dL — ABNORMAL HIGH (ref 70–99)
Potassium: 4.1 mEq/L (ref 3.5–5.3)
Sodium: 141 mEq/L (ref 135–145)

## 2010-09-19 ENCOUNTER — Encounter (HOSPITAL_BASED_OUTPATIENT_CLINIC_OR_DEPARTMENT_OTHER): Payer: PRIVATE HEALTH INSURANCE | Admitting: Oncology

## 2010-09-19 DIAGNOSIS — C50919 Malignant neoplasm of unspecified site of unspecified female breast: Secondary | ICD-10-CM

## 2010-10-02 ENCOUNTER — Other Ambulatory Visit: Payer: Self-pay | Admitting: Oncology

## 2010-10-02 ENCOUNTER — Encounter (HOSPITAL_BASED_OUTPATIENT_CLINIC_OR_DEPARTMENT_OTHER): Payer: PRIVATE HEALTH INSURANCE | Admitting: Oncology

## 2010-10-02 DIAGNOSIS — D0590 Unspecified type of carcinoma in situ of unspecified breast: Secondary | ICD-10-CM

## 2010-10-02 DIAGNOSIS — C50919 Malignant neoplasm of unspecified site of unspecified female breast: Secondary | ICD-10-CM

## 2010-10-02 DIAGNOSIS — Z5111 Encounter for antineoplastic chemotherapy: Secondary | ICD-10-CM

## 2010-10-02 LAB — COMPREHENSIVE METABOLIC PANEL
ALT: 20 U/L (ref 0–35)
AST: 18 U/L (ref 0–37)
Albumin: 4.1 g/dL (ref 3.5–5.2)
Alkaline Phosphatase: 116 U/L (ref 39–117)
BUN: 20 mg/dL (ref 6–23)
CO2: 20 mEq/L (ref 19–32)
Calcium: 9.2 mg/dL (ref 8.4–10.5)
Chloride: 101 mEq/L (ref 96–112)
Creatinine, Ser: 0.82 mg/dL (ref 0.40–1.20)
Glucose, Bld: 141 mg/dL — ABNORMAL HIGH (ref 70–99)
Potassium: 4.2 mEq/L (ref 3.5–5.3)
Sodium: 135 mEq/L (ref 135–145)
Total Bilirubin: 0.3 mg/dL (ref 0.3–1.2)
Total Protein: 7.1 g/dL (ref 6.0–8.3)

## 2010-10-02 LAB — CBC WITH DIFFERENTIAL/PLATELET
BASO%: 0 % (ref 0.0–2.0)
Basophils Absolute: 0 10*3/uL (ref 0.0–0.1)
EOS%: 0 % (ref 0.0–7.0)
Eosinophils Absolute: 0 10*3/uL (ref 0.0–0.5)
HCT: 31 % — ABNORMAL LOW (ref 34.8–46.6)
HGB: 10.1 g/dL — ABNORMAL LOW (ref 11.6–15.9)
LYMPH%: 6.9 % — ABNORMAL LOW (ref 14.0–49.7)
MCH: 31.9 pg (ref 25.1–34.0)
MCHC: 32.6 g/dL (ref 31.5–36.0)
MCV: 97.8 fL (ref 79.5–101.0)
MONO#: 0.7 10*3/uL (ref 0.1–0.9)
MONO%: 4.2 % (ref 0.0–14.0)
NEUT#: 14.5 10*3/uL — ABNORMAL HIGH (ref 1.5–6.5)
NEUT%: 88.9 % — ABNORMAL HIGH (ref 38.4–76.8)
Platelets: 347 10*3/uL (ref 145–400)
RBC: 3.17 10*6/uL — ABNORMAL LOW (ref 3.70–5.45)
RDW: 15.9 % — ABNORMAL HIGH (ref 11.2–14.5)
WBC: 16.4 10*3/uL — ABNORMAL HIGH (ref 3.9–10.3)
lymph#: 1.1 10*3/uL (ref 0.9–3.3)

## 2010-10-03 ENCOUNTER — Encounter (HOSPITAL_BASED_OUTPATIENT_CLINIC_OR_DEPARTMENT_OTHER): Payer: PRIVATE HEALTH INSURANCE | Admitting: Oncology

## 2010-10-03 DIAGNOSIS — Z5189 Encounter for other specified aftercare: Secondary | ICD-10-CM

## 2010-10-03 DIAGNOSIS — C50919 Malignant neoplasm of unspecified site of unspecified female breast: Secondary | ICD-10-CM

## 2010-10-07 NOTE — H&P (Signed)
Rhonda [name], ESTABROOK NO.:  [id number]   MEDICAL RECORD NO.:  [id number]          PATIENT TYPE:  INP   LOCATION:  0104                         FACILITY:  [facility]   PHYSICIAN:  Michiel Cowboy, MDDATE OF BIRTH:  [birth year]   DATE OF ADMISSION:  11/01/2008  DATE OF DISCHARGE:                              HISTORY & PHYSICAL   PRIMARY CARE Laquan Ludden:  Dr. Laurine Blazer.   CHIEF COMPLAINT:  Chest pain, shortness of breath, not feeling well.   The patient is a pleasant 60 year old female who reported history of  diastolic heart failure.  Recent creatinine 1.4 secondary to chronic  renal insufficiency as well as diabetes.  The patient for past 1 month  reports having been feeling somewhat short of breath and chest tightness  for about a week or so.  She had been recently following [employer]  diet, and thinks she lost about 20 pounds or so on that.  Otherwise, no  fevers, no chills, no nausea, no vomiting.   She had some slight back pain, but, otherwise, review of systems  unremarkable.   PAST MEDICAL HISTORY:  Significant for:   1. Diabetes.  2. Diastolic heart failure.  3. Migraine headaches.  4. Tobacco abuse.  5. Hypothyroidism.  6. History of morbid obesity.  7. History of cleft palate.  8. C. difficile in the past.   SOCIAL HISTORY:  The patient smokes a pack of cigarettes every 2 weeks.  Lives with her family,   FAMILY HISTORY:  Noncontributory.   REVIEW OF SYSTEMS:  Negative except for HPI.   ALLERGIES:  The patient is allergic to CEFTIN and SULFA.   MEDICATIONS:  1. Torsemide 20 mg 1 tab 2 times a day.  2. Paxil 50 mg daily.  3. Clonazepam 0.5 mg 3 times a day.  4. Clarinex 5 mg daily.  5. Potassium 20 mEq daily.  6. Vitamin D.  7. Iron 325 mg twice a day.  8. Lisinopril 20 mg daily.  9. Synthroid 100 mcg daily.  10.Metformin 500 mg 2 tabs twice a day.  11.Glyburide 25 mg daily.  12.Coreg CR 80 mg daily.  13.Neurontin 100 mg twice a  day.  14.Ambien as needed.  15.Metolazone 5 mg 3 times a week.  16.Indomethacin as needed.  17.Flagyl 100 mg p.o. a day for recently diagnosed thrush.  18.Florastor 250 mg twice a day.   VITALS:  Temperature 97.1, blood pressure 122/77, pulse 95, respirations  20, saturating 97% on room air.  The patient appears to be in no acute distress.  Head nontraumatic.  Moist mucous membranes.  LUNGS:  Clear to auscultation bilaterally.  HEART:  Regular rate and rhythm.  No murmurs, rubs or gallops.  LUNGS:  Clear to auscultation bilaterally.  LOWER EXTREMITIES:  Without clubbing, cyanosis or edema.  NEUROLOGIC:  The patient appears to be intact.  SKIN:  Appears to be clean, dry, and intact.  Mouth appears to be  without thrush.   LABORATORY DATA:  White blood cell count at 10.7, hemoglobin 12.3.  Sodium 138, potassium 3.8, creatinine 2.32 which is up from  baseline  1.4.  UA showing 3-6 white blood cells and few bacteria.   Chest x-ray showing cardiomegaly but no cardiopulmonary disease.  BNP  less than 30.  Echogram from April showed EF of 55-60% with grade 1  diastolic dysfunction.  EKG showing left bundle branch block which is  old.   ASSESSMENT AND PLAN:  1. This is a 60 year old female with chest pain which is very typical.      Been going on for week or so and was in acute renal failure.  2. Acute renal failure.  Hold torsemide and angiotensin-converting      enzyme inhibitor, actually given gentle intravenous fluids, check      orthostatics, check renal ultrasound.  3. History of congestive heart failure currently appears to be slowed      down.  Hold torsemide.  Follow brain natriuretic peptide.  4. Chest pain.  Given risk factors, will cycle cardiac markers.      Serial EKGs.  Check fasting lipid panel, hemoglobin A1c.  5. Diabetes.  Will hold p.o. meds to sliding scale for now.  Check      hemoglobin A1c.  6. Hypothyroidism.  Check TSH.  Continue Synthroid.  7. Depression.   Continue Paxil.  8. Headache.  The patient has history of migraines.  She is supposed      to be on Topamax, but I do not see this in her chronic medications.      Make she continues with Topamax.  This is the reason why she is not      on it, and give Percocet for p.r.n. breakthrough pain.  9. Mild urinary tract infection.  Treat with Cipro.  10.History of recent thrush, improving.  Continue Flagyl, unsure for      how many days the patient needs to be on it.  11.prophylaxis.  Protonix plus Lovenox.      Michiel Cowboy, MD  Electronically Signed     AVD/MEDQ  D:  11/01/2008  T:  11/01/2008  Job:  [id number]   cc:   Laurine Blazer, M.D.

## 2010-10-07 NOTE — Consult Note (Signed)
Rhonda [name], Rhonda [name] NO.:  [id number]   MEDICAL RECORD NO.:  [id number]          PATIENT TYPE:  INP   LOCATION:  1445                         FACILITY:  [facility]   PHYSICIAN:  Francisca December, M.D.  DATE OF BIRTH:  1950-06-13   DATE OF CONSULTATION:  11/04/2008  DATE OF DISCHARGE:                                 CONSULTATION   REASON FOR CONSULTATION:  Chest pain.   IMPRESSION:  1. Atypical angina.  2. Multiple risk factors for coronary heart disease including diabetes      mellitus, obesity, elevated lipids, tobacco use.  3. History of cardiomyopathy, systolic dysfunction, heart failure.  4. Above primarily resolved, last left ventricular function study,      ejection fraction approximately 50%  in 2009.  5. Acute renal failure (mild), resolved, probably secondary to      nonsteroidal antiinflammatory drugs.  6. Migraine headaches.  7. Hypothyroidism.  8. Obesity.  9. Cleft palate repair.  10.Asthma.   RECOMMENDATIONS:  1. We will arrange for 2-day Myoview study at [facility].  If other      than completely normal, the patient will need cardiac      catheterization.  2. Restart lisinopril given the history of LV dysfunction.  3. Agree with aspirin.   FINDINGS:  Rhonda [name] is a 60 year old woman well known to me, I have  seen her for many years for systolic heart failure.  Over the years,  this has slowly improved to the range of 45-50%.  She has had symptoms  of systolic heart failure in the past, but less so recently.  I have not  seen her since July 2009.  She has recently been experiencing  predominately exertional mid scapular and mid substernal chest tightness  to chest pain.  Again, it radiates to the upper mid back.  It also  persists like a small paperweight on her mid chest at rest.  The  discomfort does not otherwise radiate.  It has been associated with  sensation of dyspnea and difficulty getting her breath.  No nausea,  vomiting, or  diaphoresis.  She reported these symptoms to Dr.  [name], but at that time was having problems with apparent oral  thrush and it was decided to pursue more aggressive pulmonary treatment  and resolution of her oral thrush.  However, the discomfort worsened and  persisted at rest, so she initiated the use of high-dose ibuprofen for  approximately 2 days.  She presented to [facility] on November 01, 2008, with the persistent symptoms of chest pain and was found to have  mild renal insufficiency with a creatinine of 2.32.  This is up from a  baseline of 1.4.  Since admission here, she has had cardiac markers that  were negative x3 and a lipid study showing a total cholesterol of 216  with an LDL of 161.  Apparently, Lipitor, with which she had been  treated in the past, has been discontinued for unclear reasons.   PAST MEDICAL HISTORY:  As noted above.   SOCIAL HISTORY:  She smokes a  pack of cigarettes every 2 weeks.  Lives  with her family in [place].  She is currently on disability.  She did  work for [employer] in the [employer].   FAMILY HISTORY:  Not significant for early coronary artery disease.   ALLERGIES TO MEDICATIONS:  CEPHALOSPORINS and SULFA.   MEDICATIONS ON ADMISSION:  1. Torsemide 20 mg 1 tablet 2 times daily.  2. Paxil 50 mg p.o. daily.  3. Clonazepam 0.5 mg p.o. t.i.d. p.r.n.  4. Clarinex 5 mg p.o. daily.  5. Potassium chloride 20 mEq p.o. daily.  6. Iron 325 mg p.o. b.i.d.  7. Lisinopril 20 mg p.o. daily.  8. Levothroid 100 mcg daily.  9. Metformin 1000 mg p.o. b.i.d.  10.Glyburide 25 mg p.o. daily.  11.Coreg CR 80 mg p.o. daily.  12.Neurontin 100 mg p.o. b.i.d.  13.Ambien 5-10 mg p.o. at bedtime p.r.n.  14.Metolazone 5 mg 3 times weekly.  15.Indomethacin and ibuprofen as needed.  16.Florastor 250 mg twice a day.   REVIEW OF SYSTEMS:  Negative except as mentioned above.  She has been  chronically fatigued.  She also has joined  [employer] recently and  lost about 20 pounds.   PHYSICAL EXAMINATION:  VITAL SIGNS:  Blood pressure 122/70, pulse 90 and  regular, respiratory rate 20, temperature 97.1, O2 saturation 97% on  room air.  GENERAL:  This is a fatigued-appearing 60 year old obese African  American woman in no distress, well kept.  HEENT:  Unremarkable with the exception of cleft palate repair.  She has  a somewhat nasal speaking voice.  The carotid upstrokes are normal  without bruit.  There is no JVD.  CHEST:  Diminished breath sounds bilaterally.  No wheezes, rales, or  rhonchi.  HEART:  Regular rhythm.  Normal S1 and S2.  No murmur, click, or rub.  ABDOMEN:  Obese, soft, nontender.  LOWER EXTREMITIES:  No edema.  Distal pulses are diminished, but  present.  Range of motion is full.  NEUROLOGIC:  Cranial nerves II-XII are intact.  Motor and sensory is  grossly intact.  Gait not tested.  SKIN:  Warm, dry, and clear.   ACCESSORY CLINICAL DATA:  CK-MB and troponin negative as mentioned  above.  Admission electrocardiogram shows sinus rhythm and left bundle-  branch block which is not new.  Admission chest x-ray showed  cardiomegaly only.  Of note, she had a CT scan of her chest  approximately 1 month ago when admitted for pneumonia, that was negative  for pulmonary embolism.   COMMENTS:  [name] is a difficult historian; however, her symptoms are  certainly quite concerning for the possibility of new onset coronary  artery disease.  This has not been diagnosed in the past.  However, it  has been some time since she has had a diagnostic study either  catheterization or myocardial perfusion scintigraphy.  Therefore, same  is needed and we will arrange for this as mentioned above.      Francisca December, M.D.  Electronically Signed     JHE/MEDQ  D:  11/04/2008  T:  11/05/2008  Job:  [id number]   cc:   Otilio Connors. [name], M.D.

## 2010-10-07 NOTE — H&P (Signed)
NAMEIMONIE, TUCH NO.:  [id number]   MEDICAL RECORD NO.:  [id number]          PATIENT TYPE:  INP   LOCATION:  3712                         FACILITY:  [facility]   PHYSICIAN:  Acey Lav, MD  DATE OF BIRTH:  1951-03-02   DATE OF ADMISSION:  09/15/2008  DATE OF DISCHARGE:                              HISTORY & PHYSICAL   MEDICATION ALLERGIES:  Ceftin, which causes swelling diffusely, sulfa.   CURRENT MEDICATIONS:  1. Metformin 1 g twice daily.  2. Lasix 40 mg daily.  3. Metolazone 5 mg three times weekly, which she takes 1/2 hour before      her Lasix dose.  4. Paxil 25 mg extended release once weekly.  5. Potassium chloride 20 mEq once daily.  6. Coreg 25 mg twice daily.  7. Topamax 100 mg once daily.  8. Advair Diskus 250/50 one inhalation twice daily.  9. Hydroxyzine 50 mg t.i.d. as needed.  10.Lisinopril 20 mg once daily.  11.Synthroid 75 mcg daily.  12.Albuterol 90 mcg inhaler 2 puffs q.i.d. as needed.      Acey Lav, MD  Electronically Signed     CV/MEDQ  D:  09/16/2008  T:  09/16/2008  Job:  [id number]

## 2010-10-07 NOTE — Group Therapy Note (Signed)
REASON FOR REFERRAL:  Knee pain and back pain.   CHIEF COMPLAINT:  Knee pain greater than back pain.   A 60 year old female who gives a 2-3 month history of back pain.  This  is both in the thoracic area as well as in her lumbar area.  She now has  some pain around her upper trapezius area, neck area.  She has not had  any imaging studies for this per her report.  I do see a pelvic x-ray  dated January 30, 2008, showing normal hips and no SI arthropathy.  Looking at the film on E-chart, the patient demonstrates some facet  arthropathy particularly on the left at L4-L5, SI joints look only  mildly sclerotic, right acetabulum has a osteophyte superiorly.   She has had a more recent lower extremity MR of the left knee showing  intact ACL, PCL, and there is a small ganglion extending out the lateral  collateral ligament complex.  She has grade 4 chondromalacia,  degenerative fraying at the medial meniscus.   Right knee x-ray on January 30, 2008, shows medial compartment  degenerative changes in the right knee, on x-ray MR July 07, 2007,  of the right knee showed medial compartment degenerative changes with  focus of AVN and degenerative meniscal tear.   In addition, the patient complains of some tingling and burning pain in  her feet, worse at night, but intermittent during the day.  She does see  a podiatrist, Dr. Leeanne Deed.   The patient's pain is worse with walking, bending, standing, and  improves with rest and medications.  Relief from meds is good, although  she tells me that the Robaxin and nabumetone are not quite as effective  as the Indocin.   She was last employed June 2009, on disability since December 1999.  She  needs assist with household duties and shopping, but is otherwise  independent.   REVIEW OF SYSTEMS:  Positive for trouble walking, depression, anxiety,  as well as for weight gain and blood sugar regulation problems.  She is  asthmatic and she has wheezing  as part of her review of systems.   PAST HISTORY:  Significant for diabetes, nonischemic cardiomyopathy,  hypothyroidism, hypertension, depression.   SOCIAL HISTORY:  Married, lives with her youngest daughter.  She smokes  3-5 cigarettes per day.  Denies alcohol or illegal drug use.  Her  Oswestry score is 40%.   PHYSICAL EXAMINATION:  Her blood pressure is 130/68, pulse 75,  respirations 18, O2 sat 95% on room air.  She is a morbidly obese female  in no acute distress.  She indicates that she is scheduling for a lap  band procedure.   Her extremities show no evidence of edema.  Orientation x3.  Affect is  alert.  Gait is with a widened base support, but this is due to body  habitus.  She has normal coordination in the upper and lower limbs,  normal deep tendon reflexes in upper and lower extremities, and normal  sensation to pinprick and light touch.  Motor strength is 5/5 in  bilateral deltoid, biceps, triceps grip as well as hip flexion, knee  extension, and ankle dorsiflexion.   Her neck range of motion is full.  Her lumbar spine range of motion is  75% range, forward flexion and extension, and this is accompanied by  pain and stiffness mild in severity.   She has normal range of motion in bilateral upper and lower extremities.  In the left  knee, she has popliteal cyst.   Her lumbar spine has mild tenderness to palpation as does her thoracic  paraspinals.   IMPRESSION:  1. Probable thoracolumbar spondylosis causing thoracolumbar pain.  2. Bilateral knee pain due to osteoarthritis and meniscal      degeneration.   PLAN:  1. She is trialed some physical therapy for her back pain in the past.      She states it really did not help her much, certainly has not been      much follow through in terms of exercise.  We would like to get      some imaging studies to better delineate anatomy.  I was able to      see the L4-L5 facets on the pelvic films, but not any further       cephalad.  2. In terms of her knee pain, we will add some Voltaren.  She has      tried some injections for her DJD, which were not particularly      helpful.  She has tried oral nonsteroidal inflammatories which I      would use with caution given her history of hypertension as well as      heart disease and diabetes.  3. Consider lumbar medial branch blocks.  4. We will trial her on some gabapentin even though she does not have      any physical exam findings of peripheral neuropathy.  I would like      to trial her on some Neurontin to see if this helps with her      burning pain in her feet.  She would be difficult to get an EMG      based on her body habitus, but should there be some worsening, this      can be scheduled and attempted.      Erick Colace, M.D.  Electronically Signed     AEK/MedQ  D:  06/04/2008 12:52:03  T:  06/05/2008 04:01:51  Job #:  [id number]   cc:   Otilio Connors. [name], M.D.  Fax: [phone]   Marlowe Kays, M.D.  Fax: [phone]

## 2010-10-07 NOTE — H&P (Signed)
NAMELOGYN, DEDOMINICIS NO.:  [id number]   MEDICAL RECORD NO.:  [id number]          PATIENT TYPE:  INP   LOCATION:  3712                         FACILITY:  [facility]   PHYSICIAN:  Acey Lav, MD  DATE OF BIRTH:  Aug 17, 1950   DATE OF ADMISSION:  09/15/2008  DATE OF DISCHARGE:                              HISTORY & PHYSICAL   HISTORY OF PRESENTING COMPLAINT:  Carola J. [name], M.D.   CHIEF COMPLAINT:  Dyspnea.   HISTORY OF PRESENT ILLNESS:  Ms. Iglesias is a 60 year old African American  lady with past medical history significant for tobacco use, asthma,  congestive heart failure, obesity, asthma ( probable obstructive sleep  apnea, and obesity hypoventilation) and who presents with a 2-day  history of worsening shortness of breath and dyspnea with wheezing for  which she has been taking her albuterol metered-dose inhaler more  frequently.  She also has been taking her diuretic medications more  frequently this  week due to dyspnea and also increased lower extremity  edema during the week.  She presented to emergency department at [facility] for evaluation.  In the emergency room she was found to be  oxygenating 98-100% on 2 liters via nasal cannula.  Note she normally at  home is supposed to be on 2 L but has not been compliant with this  therapy always.  She also described a sensation of fluttering in her  chest that happens at times at both with rest and exertion.  She has not  had this before but this has happened over the last couple of days.  It  lasts for few seconds to minutes and then disappears.  It is not  associated with nausea or diaphoresis.  Does not radiate anywhere.  In  the emergency department, cardiac markers were negative.  D-dimer is  barely positive.  The chest x-ray showed some bilateral pleural  effusions with atelectasis as well and a CT angiogram scan which showed  the pleural effusions that were small with minimal atelectasis  and a  questionable infiltrate the right lower lung base.  She was started on  antibiotics in the emergency room.  We are asked to admit her to the  hospital for further evaluation and treatment of her pulmonary  complaints.   PAST MEDICAL HISTORY:  As described above.  1. She has history of diabetes mellitus.  2. Hypothyroidism.  3. Hypertension.  4. Congestive heart failure.  Reportedly her ejection fraction was 60%      but this was less checked 9 years ago according to what I can tell      from the computer.  5. History of morbid obesity.  6. History of cleft palate.  7. Tobacco abuse.  8. Hypothyroidism.  9. She apparently has had Clostridium difficile colitis twice while      taking antibiotics.   PAST SURGICAL HISTORY:  1. She has had endometrial polyp removed.  2. Hysterectomy.  3. Dilatation and curettage.  4. She had colonoscopy before.   FAMILY HISTORY:  Noncontributory.   SOCIAL HISTORY:  The patient  lives with her daughter and with her  husband and the daughter's children at her house.  She smokes a pack of  cigarettes every 2 weeks.  No alcohol or other recreational drug use.   REVIEW OF SYSTEMS:  As described above in history of present illness and  otherwise 10-point review systems negative.   PHYSICAL EXAMINATION:  VITAL SIGNS:  Blood pressure initially 175/99,  pulse 94, respirations 24, pulse ox was 100% on 2 L via nasal cannula.  T-max of 97.  GENERAL:  Morbidly obese lady.  HEENT:  She has some facial plethora.  Oropharynx:  She has a cleft  palate with no erythema or exudate.  NECK:  Thick.  CARDIOVASCULAR:  Regular rate and rhythm.  LUNGS:  Diffuse wheezing posteriorly in the expiratory phase.  ABDOMEN:  Soft, morbidly obese but nondistended, nontender.  EXTREMITIES:  Without edema.  SKIN:  She does not have any particular maxillary facial sinus  tenderness.  MUSCULOSKELETAL:  No tenderness to palpation of her chest.   LABORATORY DATA:  A CT and  chest x-ray as described above.  Beta  natriuretic peptide 166.  Complete metabolic panel sodium 136,  4.1  potassium, chloride 104, bicarb 26, BUN 80 and creatinine 1.3.  Cardiac  markers were negative.  PT 35, PT 13.7.  D-dimer barely positive at 1.  Urinalysis specific gravity 1.025, otherwise with trace leukocyte  esterase and the microscopic was negative.  CBC showed white count of  11.5, hemoglobin 11.2, platelets 363,000.   ASSESSMENT/PLAN:  This is a 60 year old African American lady with a  history of congestive heart failure, morbid obesity,  diabetes mellitus,  smoking and reactive airway disease who presents with increasing dyspnea  with pleural effusions, slightly elevated beta natriuretic peptide,  wheezing and questionable right lower lobe pneumonia,   1. Dyspnea.  Certainly could be multifactorial given her history of      asthma, heart failure and also what is thought to be possibly an      infiltrate on chest CT scan.  I am going to give her albuterol      nebulizers overnight.  For the moment I am going to hold her      diuretics given that she just had a CT angiogram of  large amount      of contrast so I would like to avoid giving her diuretics for now      to try and minimize nephrotoxicity with contrast.  I will give her      azithromycin 500 mg daily and she can complete a 7-day course of      azithromycin in cae there really is a pneumonia  I do not think she      has a terribly impressive picture community-acquired pneumonia, but      I will give her azithromycin. I think her CHF is likely playing a      larger role given her bilateral pleural effusions and known CHF. I      would reinstute diuretics tomorrow but am holding off at present      given the fact that she had CTangiogram and that I dont want to      contract her intravascular volume in this settting.  2. History congestive heart failure.  I will continue her on her Coreg      and will hold her  diuretics for further for the moment as      mentioned.  3. Diabetes mellitus.  I will  put her on a sliding-scale insulin and      hold her metformin as mentioned.   1. Chronic obstructive pulmonary disease.  Continue Advair discus.      Continue her on albuterol.  2. Anxiety.  Continue her on clonazepam.  3. Migraine headaches.  She will be continued on her Topamax      prophylactically.  We will give her some morphine as well for      breakthrough pain.  4. Atypical chest pain.  Will cycle her cardiac enzymes three times.      I doubt this is cardiac chest pain but will certainly cycle her      cardiac enzymes x3 and order a 2-D echocardiogram in the morning.   Prophylaxis: sq heparin   CODE: FULL   ADDENDUM: MED LIST AND ALLERGIES DICTATED SEPARATELY      Acey Lav, MD  Electronically Signed     CV/MEDQ  D:  09/16/2008  T:  09/16/2008  Job:  [id number]   cc:   Otilio Connors. [name], M.D.

## 2010-10-07 NOTE — Discharge Summary (Signed)
Rhonda [name], Rhonda [name]                ACCOUNT NO.:  [id number]   MEDICAL RECORD NO.:  [id number]          PATIENT TYPE:  INP   LOCATION:  1440                         FACILITY:  [facility]   PHYSICIAN:  Corinna L. [name], MDDATE OF BIRTH:  November 13, 1950   DATE OF ADMISSION:  11/01/2008  DATE OF DISCHARGE:  11/07/2008                               DISCHARGE SUMMARY   DISCHARGE DIAGNOSES:  1. Chest pain, noncardiac.  2. Migraine headache.  3. Acute renal failure, most likely secondary to nonsteroidal anti-      inflammatories in the setting of ACE inhibitor and diuretics.  4. History of congestive heart failure secondary to diastolic      dysfunction, compensated.  5. Diabetes.  6. Hypothyroidism.  7. Depression.  8. Morbid obesity  9. History of asthma, stable.  10.Hyperlipidemia.  11.Hypertension.  12.Cleft palate repair.   DISCHARGE MEDICATIONS:  Hold torsemide and potassium until follow-up  with Dr. Amil Amen.  Continue:  1,  Paxil CR 50 mg a day.  1. Clonazepam 0.5 mg t.i.d. as needed.  2. Iron 325 mg a day.  3. Synthroid 100 mcg a day.  4. Coreg CR 80 mg a day.  5. Neurontin 100 mg twice a day.  6. Florastor 250 mg twice a day.  7. Topamax 100 mg nightly.  8. Advair 250/50 1 puff b.i.d.  9. Claritin 10 mg a day.  10.Metformin 1000 mg twice a day.  11.Glyburide 2.5 mg a day.  12.Aspirin 81 mg a day.  13.Zocor 20 mg a day.  14.Lisinopril 20 mg a day.  15.Percocet as directed for severe pain.   CONDITION:  Stable.   ACTIVITY:  No driving for a day.  No lifting for 1 week.   FOLLOW UP:  With Dr. Amil Amen in 2 weeks.  Dr. [name] in 1-2 weeks.   CONSULTATIONS:  Cardiology.   DIET:  Should be diabetic, cardiac.   PROCEDURES:  Cardiac catheterization, which showed no significant  coronary disease.   LABS:  CBC essentially unremarkable.  PT/PTT normal.  Basic metabolic  panel on admission significant for BUN of 50, creatinine 2.32.  At  discharge her BUN was 27,  creatinine 1.14.  LFTs significant for an  albumin of 3.2.  Hemoglobin A1c was 6.8.  Cardiac enzymes negative x3.  LDL 160, HDL 37, triglycerides 88, total cholesterol was 216.  TSH  0.372.  Urinalysis showed trace leukocyte esterase, squamous epithelial  cells.   SPECIAL STUDIES:  Radiology:  Admission chest x-ray showed cardiac  enlargement.  Renal ultrasound showed no hydronephrosis, perhaps mildly  increased cortical echo texture.  Cardiolite showed a reduced activity  in the septal, inferior and apical walls on both stress and rest images,  query scar with corresponding hypokinesis, no inducible ischemia.  EKG  showed normal sinus rhythm with left bundle branch block which is old.   HISTORY AND HOSPITAL COURSE:  Rhonda [name] is a 60 year old white female  with multiple medical problems who is followed by Dr. Amil Amen.  She has  a history of congestive heart failure secondary to diastolic dysfunction  but no documented  coronary artery disease.  She presented with chest  pain and had been taking a lot of nonsteroidal anti-inflammatories.  She  was found to be in acute renal failure.  Her ACE inhibitor and torsemide  were held and she was started on IV fluids.  Initially, there was  concern of a urinary tract infection but this was a contaminated  specimen and the patient had no symptoms so antibiotics were stopped.  The patient's renal failure resolved but she continued to have the  exertional chest pain.  Cardiology was consulted and performed a 2-day  Myoview, which was abnormal.  She subsequently had a cardiac  catheterization, which showed no significant heart disease and the  patient was able to be discharged home.  The day of discharge, she had a  migraine, which is not new for her and she was discharged home with  Percocet as needed for pain.      Corinna L. [name], MD  Electronically Signed     CLS/MEDQ  D:  11/22/2008  T:  11/22/2008  Job:  [id number]

## 2010-10-07 NOTE — Assessment & Plan Note (Signed)
A 60 year old female with 34-month history of back pain both in the  thoracic area as well as lumbar area and pain in the upper trapezius  area and neck area.  She has had pelvic x-ray on January 30, 2008,  showing normal hips and no SI joint arthropathy.  I sent her for  thoracic and lumbar spine x-rays both of which were normal.   She has no pain, not sure.  Her burning pain in the feet has resolved.  She is focused on changing her diet and losing weight.  She has lost  some weight, since I last saw her in initial consultation on June 05, 2008.   Her knee pain is improved with the Voltaren gel.   Her walking tolerance is about 3 minutes.  She can climbs steps slowly.  She drives.  She is planning to join a health club and work in a part-  time job again.  She has had review of systems positive for tingling,  trouble walking, dizziness, confusion, depression, anxiety, and has had  some change in her depression medications.  She has some shortness  breath, wheezing, and fatigue.   PAST HISTORY:  Diabetes, which she states is under better control,  hypothyroidism, and arthritis.   SOCIAL HISTORY:  Married and lives with her daughters.  Smokes 1 or 2  cigarettes per day.  She has a daughter with bipolar disorder that had  to withdraw from college this semester because of her mental illness.   PHYSICAL EXAMINATION:  GENERAL:  Morbidly obese female in no acute  distress.  Orientation x3.  Affect is alert.  Gait is normal.  VITAL SIGNS:  Her blood pressure is 139/69, pulse 70, respirations 18,  and O2 sat 97% on room air.  EXTREMITIES:  Her upper extremity and lower extremity range of motion  are full.  Her back and neck, she had no tenderness to palpation.  No  pain over the 18 fibromyalgic tender points palpated.  She has normal  strength.  No evidence of knee effusion or erythema.  No joint  deformities in the upper and lower extremities.   IMPRESSION:  Mid back and low back  pain with a normal examination and  normal x-rays.   Functionally, she is improving.  She is becoming more active.  Although,  she does complain of fatigue.  I do not see any signs of fibromyalgia  syndrome in terms of physical examination.  I do not know whether she  has had her thyroid level checked recently.  Certainly, she has some  psychologic issues of depression and anxiety and this could certainly  contribute to her feelings of fatigue and does not currently see a  psychologist or psychiatrist.  She is on a SSRI, however.   In terms of her knees, she is doing better.  She has some chondromalacia  patella and some fraying of the medial meniscus on the MRI.  We will  continue with the Voltaren gel.  She uses for back as well.   I will see her back in about 3 months in followup.      Erick Colace, M.D.  Electronically Signed    AEK/MedQ  D:  07/03/2008 13:41:20  T:  07/04/2008 02:10:23  Job #:  [id number]   cc:   Otilio Connors. [name], M.D.  Fax: [phone]

## 2010-10-09 ENCOUNTER — Encounter (HOSPITAL_BASED_OUTPATIENT_CLINIC_OR_DEPARTMENT_OTHER): Payer: PRIVATE HEALTH INSURANCE | Admitting: Oncology

## 2010-10-09 ENCOUNTER — Other Ambulatory Visit: Payer: Self-pay | Admitting: Oncology

## 2010-10-09 DIAGNOSIS — C50919 Malignant neoplasm of unspecified site of unspecified female breast: Secondary | ICD-10-CM

## 2010-10-09 DIAGNOSIS — D0590 Unspecified type of carcinoma in situ of unspecified breast: Secondary | ICD-10-CM

## 2010-10-09 LAB — CBC WITH DIFFERENTIAL/PLATELET
BASO%: 0.4 % (ref 0.0–2.0)
Basophils Absolute: 0 10*3/uL (ref 0.0–0.1)
EOS%: 1.5 % (ref 0.0–7.0)
Eosinophils Absolute: 0.1 10*3/uL (ref 0.0–0.5)
HCT: 30.8 % — ABNORMAL LOW (ref 34.8–46.6)
HGB: 10.4 g/dL — ABNORMAL LOW (ref 11.6–15.9)
LYMPH%: 20.8 % (ref 14.0–49.7)
MCH: 33.9 pg (ref 25.1–34.0)
MCHC: 33.7 g/dL (ref 31.5–36.0)
MCV: 100.5 fL (ref 79.5–101.0)
MONO#: 1.3 10*3/uL — ABNORMAL HIGH (ref 0.1–0.9)
MONO%: 17.5 % — ABNORMAL HIGH (ref 0.0–14.0)
NEUT#: 4.3 10*3/uL (ref 1.5–6.5)
NEUT%: 59.8 % (ref 38.4–76.8)
Platelets: 278 10*3/uL (ref 145–400)
RBC: 3.07 10*6/uL — ABNORMAL LOW (ref 3.70–5.45)
RDW: 16.7 % — ABNORMAL HIGH (ref 11.2–14.5)
WBC: 7.3 10*3/uL (ref 3.9–10.3)
lymph#: 1.5 10*3/uL (ref 0.9–3.3)

## 2010-10-09 LAB — BASIC METABOLIC PANEL
BUN: 16 mg/dL (ref 6–23)
CO2: 21 mEq/L (ref 19–32)
Calcium: 8.8 mg/dL (ref 8.4–10.5)
Chloride: 106 mEq/L (ref 96–112)
Creatinine, Ser: 0.99 mg/dL (ref 0.40–1.20)
Glucose, Bld: 141 mg/dL — ABNORMAL HIGH (ref 70–99)
Potassium: 4.2 mEq/L (ref 3.5–5.3)
Sodium: 140 mEq/L (ref 135–145)

## 2010-10-10 NOTE — H&P (Signed)
NAMEMAHALIA, [name] NO.:  [id number]   MEDICAL RECORD NO.:  [id number]          PATIENT TYPE:  EMS   LOCATION:  ED                           FACILITY:  [facility]   PHYSICIAN:  Hollice Espy, M.D.DATE OF BIRTH:  [birth year]   DATE OF ADMISSION:  07/01/2006  DATE OF DISCHARGE:                              HISTORY & PHYSICAL   PRIMARY CARE PHYSICIAN:  Carola J. [name], M.D.   CHIEF COMPLAINT:  Shortness of breath.   HISTORY OF PRESENT ILLNESS:  The patient is a 60 year old African-  American female with a past medical history of obesity, CHF, tobacco  abuse and asthma who presents to her PCP complaining of several days of  coughing, shortness of breath and general malaise and aches. It was  highly suspected that the patient had the flu.  A flu screen tested  positive but the patient was complaining of increased shortness of  breath and wheezing, and it was felt that possibly her flu-like symptoms  had exacerbated her asthma. She came to the emergency room for further  evaluation.   In the emergency room the patient was seen and evaluated by the ER  physician. Chest x-ray was ordered which showed no evidence of any acute  infiltrate. Labs were ordered on the patient and she was found to have a  white count of 6.8 with only 75% neutrophils which was in the normal  range. The patient was given a dose of albuterol and Atrovent and was  put on oxygen and she said that helped her symptoms somewhat.   Currently, she still complains of generalized aches and pains and  congestion. She denies any headaches or vision changes. No dysphagia. No  chest pain or palpitations. She does complain of some shortness of  breath with wheezing and coughing. Minimal sputum, and if so, it is  whitish.  She denies any abdominal pain. No hematuria or dysuria. No  constipation or diarrhea. No focal extremity numbness, weakness or pain  other than that of generalized body aches.  Review of systems otherwise  negative.   The patient's past medical history includes:  1. A history of idiopathic cardiomyopathy, compensated, with an      ejection fraction of 60% which is documented in May 2000. She      likely has had followup echo since then. However, it is not in the      hospital records.  2. The patient also has a history of obesity.  3. Tobacco abuse.  4. Medical noncompliance.  5. Asthma.  6. She also has a history of hypothyroidism.  7. History of cleft palate that has led to recurrent sinusitis.  8. Normochromic anemia.   MEDICATIONS:  The patient has brought a list of her medications with  her. However, she does not know doses. She is on metformin 500 b.i.d.,  Lasix 20 b.i.d. Doses are based off previous discharge summary.  Hydroxyzine 150 p.o. daily. Paxil 50 p.o. daily.  K-Dur 20 mEq p.o.  daily, Percocet p.r.n. Coreg 25 p.o. daily, Topamax p.r.n. Ambien 5 p.o.  q.h.s.  ALLERGIES:  The patient is allergic to CEPHALOSPORINS and SULFA.   FAMILY HISTORY:  Noncontributory.   SOCIAL HISTORY:  She admits to smoking about a pack every two weeks of  cigarettes. No alcohol or drug use.   PHYSICAL EXAMINATION:  VITAL SIGNS: The patient's vitals on admission  were temperature 101, heart rate 83, blood pressure 177/90, now down to  116/54. Respirations 22. O2 sat 97% on room air, 100% on 2 L.  GENERAL:  The patient is alert and oriented x3, in some mild distress.  HEENT:  Normocephalic, atraumatic. Her mucous membranes are dry. She has  no carotid bruits.  HEART: Regular rate and rhythm.  Soft.  LUNGS: She has bilateral expiratory wheezing and scattered rales.  ABDOMEN:  Soft, nontender and nondistended. Positive bowel sounds.  EXTREMITIES:  No clubbing or cyanosis.  Trace pitting edema bilaterally.   LAB WORK:  Chest x-ray is as per HPI.  Sodium 136, potassium 2.5,  chloride 99, bicarb 30, BUN 15, creatinine 0.9, glucose 126.  White  count 6.8, no  shift. H and H 10.9 and 32.4.  MCV is 90.  Platelet count  441.   ASSESSMENT AND PLAN:  1. Influenza.  Will symptomatically treat with Tylenol for fevers,      oxygen and supportive care with IV fluids, being careful not to      volume overload.  2. Asthma exacerbation.  Will treat with IV Solu-Medrol as well as      secondary sliding scale to cover for hyperglycemia plus breathing      treatments and oxygen.  At this time no antibiotics are indicated.  3. Diabetes mellitus.  Will put the patient on Lantus 5 q.h.s. plus      sliding scale. I am hesitant to resume metformin even after she is      better given her history of CHF which needs to be further followed.  4. Hypothyroidism. Continue Synthroid.  5. History of CHF, stable right now. Continue Coreg.  6. Depression.  Continue Paxil.  7. Tobacco abuse.  Patient is declining a nicotine patch at this time.      Hollice Espy, M.D.  Electronically Signed     SKK/MEDQ  D:  07/01/2006  T:  07/01/2006  Job:  [id number]   cc:   Otilio Connors. [name], M.D.  Fax: [phone]

## 2010-10-10 NOTE — Discharge Summary (Signed)
NAMEJANVI, AMMAR                ACCOUNT NO.:  [id number]   MEDICAL RECORD NO.:  [id number]          PATIENT TYPE:  INP   LOCATION:  1512                         FACILITY:  [facility]   PHYSICIAN:  Corinna L. [name], MDDATE OF BIRTH:  [birth year]   DATE OF ADMISSION:  09/18/2005  DATE OF DISCHARGE:  09/20/2005                                 DISCHARGE SUMMARY   DISCHARGE DIAGNOSES:  1.  Acute bronchitis and exacerbation of asthma.  2.  Acute sinusitis with history of chronic and recurrent sinusitis.  3.  Recent extraction of several teeth.  4.  Cleft palate.  5.  Uncontrolled diabetes.  6.  Resolved acute renal insufficiency.  7.  History of congestive heart failure, compensated at this time.  8.  Nonischemic cardiomyopathy.  9.  Depression.  10. Anxiety.  11. History of migraines.  12. Tobacco abuse.  13. Morbid obesity.   DISCHARGE MEDICATIONS:  1.  She is to stop her Avelox and clindamycin and instead take doxycycline      100 mg p.o. b.i.d. for five more days and Augmentin 875 mg p.o. b.i.d.      for five more days.  2.  Prednisone taper.  3.  Afrin two sprays per nostril twice a day for three days only.   She may continue her other outpatient medications, which include:  1.  Hydroxyzine as needed for anxiety.  2.  Clarinex 5 mg a day.  3.  Lisinopril 20 mg a day.  4.  Rhinocort daily.  5.  Synthroid 0.075 mg a day.  6.  Ambien as needed at night.  7.  Coreg 25 mg twice daily.  8.  Zaroxolyn 5 mg every Monday, Wednesday, Friday.  9.  Topamax as previous.  10. Lipitor 40 mg nightly.  11. Advair 250/50 mcg twice daily.  12. Glucovance as previous.  13. Lyrica 50 mg t.i.d. as previous.  14. Lasix 40 mg twice daily.  15. Paxil CR 50 mg a day.  16. Clonazepam 0.5 mg p.o. b.i.d. as needed.  17. She may continue her hydrocodone-containing cough syrup.  18. Motrin as needed.  19. Albuterol MDI two puffs q.4h. as needed.   CONDITION:  Stable.   ACTIVITY:  Ad lib.   FOLLOW-UP:  With Dr. Clyde Canterbury in a week.   CONSULTATIONS:  None.   PROCEDURES:  None.   DIET:  Low-salt diabetic.   PERTINENT LABORATORIES:  CBC on admission was significant for a white blood  cell count of 14,000, but the patient was on steroids.  Otherwise fairly  unremarkable.  Basic metabolic panel on admission was significant for a  potassium of 3.2, bicarbonate of 34, chloride 91, glucose 253, BUN 31,  creatinine 1.4.  Her creatinine was 1.5 on April 28 and was 1.1 on April 29,  which is her baseline.  Her creatinine at discharge was 3.8 after repletion.  Her bicarbonate was 32.  Magnesium normal.  Hemoglobin A1c was 6.7.  EKG  showed normal sinus rhythm with left bundle branch block, unchanged.  Chest  x-ray showed nothing acute.   HISTORY AND HOSPITAL  COURSE:  Ms. Batley is a pleasant 60 year old black  female who was directly admitted from Dr. Delanna Ahmadi office with status  asthmaticus.  She has multiple medical problems.  She had had a recent  extraction of several teeth about a week ago, and since then she has had  problems with coughing and wheezing.  She noted that she was having some  difficulty swallowing, at least more than usual, because she was unable to  wear her prosthetic for her cleft palate.  An x-ray had been done two days  prior to admission, which was negative for pneumonia.  She had been treated  with clindamycin several weeks, it sounds like, prior to her dental  procedure and was still taking this at home as far as I can tell.  She also  was started on Avelox a few days ago by her primary care physician.  She  smokes about five cigarettes a day, but there is no reported history of  COPD, just asthma.  She reports that she does not frequently have asthma  exacerbations.  She had been having a lot of sinus drainage and postnasal  drip.  She has a history of chronic and recurrent sinusitis.  She had been  on a prednisone taper as an outpatient and despite  all of this, she failed  to improve.  She was therefore directly admitted.  She was started initially  on Zosyn and vancomycin while tests were pending, as she has been on many  antibiotics recently and may have an anaerobic pulmonary infection due to  the above.  Her repeat chest x-ray was negative.  She was wheezing quite a  bit on exam but was able to ambulate and had normal oxygen saturations.  Please see H&P for complete admission details.  Her wheezing improved.  She  was feeling better.  Her sinus drainage improved.  She was started on Afrin  as well and her antibiotics were able to be changed over to oral.  She also  was given a dose of IV Solu-Medrol and subsequently switched to oral  prednisone.  At this time she is feeling much better.  She is coughing quite  a bit but it is nonproductive and she has much less wheezing.  In fact, her  wheeze clears after she coughs.   Her blood glucoses were reportedly high at home and had been so while here.  She receives Lantus and insulin-resistant sliding scale Humalog as she was  on steroids and was acutely ill.  Her blood glucoses ranged in the 100s to a  single measurement of 350.  This morning her blood sugar is 160.  Her  hemoglobin A1c is 6.7, so this must be due to the steroids and acute  illness.  Her metformin had to be held due to the acute renal insufficiency  but may be resumed as her creatinine has normalized.   It was not my impression that she was having an exacerbation of heart  failure, but her medications were nevertheless resumed.   Her other medical problems remain stable.  At this time there is a question  as to whether she can find a ride.  I have asked social worker to assist for  transportation purposes.  The patient agrees that she is feeling better and  is able to go home.      Corinna L. [name], MD  Electronically Signed     CLS/MEDQ  D:  09/20/2005  T:  09/21/2005  Job:  [id number]  cc:   Carola J.  [name], M.D.  Fax: [phone]   Francisca December, M.D.  Fax: [phone]

## 2010-10-10 NOTE — Procedures (Signed)
[facility]  Patient:    Rhonda [name], Rhonda [name]                       MRN: [id number] Proc. Date: 11/05/99 Adm. Date:  [id number] Disc. Date: [id number] Attending:  Orland Mustard CC:         Carola J. [name], M.D.             Francisca December, M.D.                           Procedure Report  PROCEDURE PERFORMED:  Colonoscopy.  ENDOSCOPIST:  Llana Aliment. Randa Evens, M.D.  MEDICATIONS USED:  Patient received total of fentanyl 100 mcg and Versed 10 mg for both procedures.  INSTRUMENT:  Adult Olympus video colonoscope.  INDICATIONS:  Family history of colon cancer.  Father died of colon cancer. Mother has had multiple colon polyps and family members on both sides of the family have had colon cancer.  In view of this extraordinarily positive family history of colon cancer, colonoscopy is performed.  DESCRIPTION OF PROCEDURE:  The procedure had been explained to the patient and consent obtained.  With the patient in the left lateral decubitus position, the Olympus adult video colonoscope was inserted and advanced under direct visualization.  The prep was excellent and we were able to advance to the cecum without difficulty.  The ileocecal valve and appendiceal orifice were seen.  The scope was withdrawn.  The cecum, ascending colon, hepatic flexure, transverse colon, splenic flexure, descending and sigmoid colon were seen well upon withdrawal.  No polyps or other lesions seen.  Small internal hemorrhoids seen in the rectum upon removal of the scope.  Scope withdrawn, patient tolerated the procedure well.  Maintained on low flow oxygen and pulse oximeter throughout the procedure with no obvious problem.  ASSESSMENT:  No evidence of colon polyps.  PLAN:  Due to her strong family history of colon cancer will recommend repeating in five years.  Will plan on seeing back in the office in the next two months for evaluation for upper abdominal discomfort. DD:   11/05/99 TD:  11/07/99 Job: [id number]

## 2010-10-10 NOTE — Op Note (Signed)
Rhonda [name], Rhonda [name] NO.:  [id number]   MEDICAL RECORD NO.:  [id number]          PATIENT TYPE:  INP   LOCATION:  5710                         FACILITY:  [facility]   PHYSICIAN:  Shirley Friar, MDDATE OF BIRTH:  [birth year]   DATE OF PROCEDURE:  03/27/2006  DATE OF DISCHARGE:                                 OPERATIVE REPORT   PROCEDURE:  Flexible sigmoidoscopy.   INDICATIONS:  Diarrhea, history of C. diff colitis in the past, negative C.  Diff toxin during this hospitalization x2   MEDICATIONS:  Fentanyl 100 mcg IV, Versed 8 mg IV.   FINDINGS:  Adult adjustable colonoscope was inserted into unprepped colon  and advanced to 70 cm.  On insertion, there was scattered yellowish-whitish  raised exudate areas which were more diffuse in the proximal descending  colon and distal transverse colon.  These areas appeared to represent  pseudomembranous colitis and multiple biopsies randomly were taken.  The  surrounding mucosa was slightly edematous in a patchy fashion and there were  no obvious ulcers noted.  Retroflexion was unremarkable.   ASSESSMENT:  1. Colitis, likely secondary to pseudomembranous colitis with ischemia      being less likely - status post multiple biopsies.   PLAN:  1. Continue p.o. Flagyl.  2. And p.o. vanc and if no response or improvement by 03/28/2006.  3. Follow-up on path.  4. Wound discontinue Avelox unless this is being given for respiratory      infection.  I think her leukocytosis is secondary to this colitis.      Shirley Friar, MD  Electronically Signed     VCS/MEDQ  D:  03/27/2006  T:  03/28/2006  Job:  [id number]

## 2010-10-10 NOTE — Procedures (Signed)
[place]  Patient:    Rhonda [name], Rhonda [name]                       MRN: [id number] Proc. Date: 12/02/99 Adm. Date:  [id number] Disc. Date: [id number] Attending:  Orland Mustard CC:         Carola J. [name], M.D.                           Procedure Report  PROCEDURE:  Esophageal manometry.  GASTROENTEROLOGIST:  Llana Aliment. Edwards, M.D.  MEDICATIONS:  Fentanyl 85 mcg, Versed 8 mg IV.  INDICATIONS:  A patient with persistent epigastric pain that has failed to respond to aggressive therapy for reflux; specifically proton pump inhibitor and hyoscyamine.  She has continued to have symptoms, and these medications have not helped.  For this reason, a manometry is performed.  DESCRIPTION OF PROCEDURE:  The procedure performed in the [facility] in the usual manner.  No provocative studies were performed.  The results are as follows: 1. Upper esophageal sphincter.  Pharyngeal spikes were present, and this    sphincter appears to relax on swallowing. 2. Esophageal body.  The amplitude of distal esophagus was 208 mm with some    studies having a significantly higher amplitude.  The mean duration is    5.0 seconds which is the upper range of normal.  Peristalsis appears normal    when swallow is presented. 3. LES.  Mean pressure is 68, markedly elevated with a residual of 20.  There    appears to be incomplete relaxation with the percent relaxation estimated    by the computer of only 70%.  There does appear to be an attempt at    relaxation with swallowing.  ASSESSMENT:  Nonspecific motility disorder.  She has high amplitude waves but normal duration and normal peristalsis in the esophagus  and incomplete relaxation of an elevated lower esophageal sphincter.  This did not clearly meet the criteria of achalasia.  PLAN:  Will obtain a barium swallow of the pill and have her return to the office after that.  We may need to give her a trail of  nitrates to see if that will help. DD:  12/06/99 TD:  12/07/99 Job: [id number]

## 2010-10-10 NOTE — Consult Note (Signed)
[place]  Patient:    Rhonda [name], Rhonda [name]                       MRN: [id number] Proc. Date: 05/26/00 Adm. Date:  [id number] Attending:  Jetty Duhamel T CC:         Carola J. [name], M.D.  Jetty Duhamel, M.D.   Consultation Report  REASON FOR CONSULTATION:  Exacerbation of CHF.  PROBLEM LIST: 1. Decompensated congestive heart failure with pulmonary edema responding to    diuretics. 2. History of idiopathic cardiomyopathy, resolved, last left ventricular    function assessment May 2000; ejection fraction 61%, MUGA. 3. Congestive heart failure exacerbation likely secondary to salt indiscretion    versus upper respiratory infection. 4. History of cleft palate. 5. Chronic upper respiratory infection and sinusitis. 6. Hypothyroidism. 7. Normochromic anemia. 8. Elevated liver associative enzymes.  RECOMMENDATION: 1. Continue current dose of Lasix 80 mg IV q.12h. x 24 more hours, then    convert back to outpatient diuretic Furosemide 80 mg p.o. b.i.d. 2. Would decrease Coreg to 12.5 mg p.o. b.i.d. during acute CHF exacerbation. 3. Will check resting MUGA.  Consider further evaluation (repeat cardiac    catheterization) if a significant decrease in EF is noted. 4. History of catheterization in 1996 showed no significant CAD, but she has    had history of chest pain, negative Cardiolite last in September 2000.  FINDINGS:  Rhonda [name] is a 60 year old woman who I have followed for the last four to five years with a history of CHF and resolved cardiomyopathy.  Her EF had been as low as 35% at one point.  Last assessment as above.  She was admitted May 24, 2000 with exacerbation of dyspnea to symptoms at rest and orthopnea.  This had occurred over four to five days.  She was seen recently in the office where no significant changes in her chronic NYJ class 2-3 dyspnea were present.  A plan was made to repeat left ventricular  function assessment.  She was complaining of URI symptoms at the time and has been chronically.  Since admission with the administration of IV Lasix 80 mg q.12h. she has had a significant diuresis with some improvement in the edema on her chest x-ray and a 6 pound weight decrease.  PAST MEDICAL HISTORY:  As above.  SOCIAL HISTORY:  She lives in [place].  She works part-time as a Comptroller at [employer].  She is married but there is marital discord chronically. She has two children.  Lives at home with her daughter and husband.  She does not use any tobacco products and is not a consumer of alcohol.  FAMILY HISTORY:  Father for colon cancer, diabetes, CAD, but not at a young age.  Mother had a history of hypertension and hypothyroidism.  CURRENT MEDICATIONS:  1. Zestril 20 mg p.o. q.d.  2. Effexor 75 mg p.o. b.i.d.  3. Hydroxyzine 50 mg p.o. b.i.d. and q.h.s.  4. K-Dur 20 mEq p.o. q.d.  5. Lasix 80 mg IV q.12h.  6. Coreg 25 mg p.o. b.i.d.  7. Levaquin 500 mg p.o. q.d. for the past three weeks.  8. Levoxyl 75 mcg p.o. q.d.  9. Rhinocort two sprays each nostril q.d. p.r.n. 10. Provera 10 mg p.o. q.d. 11. Ambien 5-10 mg p.o. q.h.s. 12. Zaroxolyn 5 mg p.o. q.d. p.r.n. based on weight.  REVIEW OF SYSTEMS:  She complains chronically of sinus congestion, recurrent sinus infections.  She  has chronic fatigue and obesity.  She has upper thoracic back pain and mid lumbar distribution as well.  She had generalized body aches for six to seven days prior to admission.  She denied any pleuritic type chest pain or cough.  No hemoptysis.  No change in bowel habits or constipation, diarrhea, melena, hematochezia.  No hematuria, nocturia, dysuria.  She did have lower extremity edema that was worsening.  She denies any calf pain or erythema.  She has had no recent neurologic symptoms.  She has been on antidepressants chronically for years.  PHYSICAL EXAMINATION:  VITAL SIGNS:  Blood  pressure 115/55, pulse 75 and regular, respiratory rate 20, O2 saturation on room air 95%, temperature 97.7.  GENERAL:  An obese, alert, oriented, cooperative 60 year old woman in no distress.  HEENT:  Pupils are equal, round and reactive to light.  Extraocular movements are intact.  Surgical scars consistent with repair of cleft palate present. Oral mucosa:  Pink and moist.  NECK:  Supple without thyromegaly or masses.  Carotid upstrokes are normal. No bruits.  There is 8 cm of jugular venous distention at 30 degrees inclination.  CHEST:  Good excursion bilaterally.  Adequate breath sounds.  No rales, rhonchi, wheezes noted.  HEART:  Regular rhythm.  Normal S1 and S2 heard.  Soft ejection systolic murmur grade 2/6 present along the left sternal border.  PMI not palpable.  ABDOMEN:  Obese, soft, nontender.  No hepatosplenomegaly or midline pulsatile mass.  No tenderness or guarding, rebound.  Bowel sounds present in all quadrants.  GENITALIA:  Not examined.  RECTAL:  Not performed.  EXTREMITIES:  Full range of motion.  Edema 2+ to the knees over the anterior calf bilaterally.  SKIN:  Warm, dry, and clear.  LABORATORIES:  Electrocardiogram showed left bundle branch block, sinus rhythm.  Serial CK and MB cardiac enzymes were negative x 2.  Troponin 0.01.  SGOT 93, SGPT, alkaline phosphatase 155, albumin 2.6, creatinine 1.0, glucose 133, serum electrolytes normal.  Hemoglobin 9.5, hematocrit 28.7, white blood cell count 10,900 on admission.  COMMENTS:  Cayden is an unfortunate individual with multiple chronic problems.  She did present with significant CHF several years ago with a decrease in her LV function.  Cardiac catheterization at the time failed to reveal any epicardial coronary diseases in etiology.  She has responded nicely to the standard medical therapy for systolic dysfunction and heart failure. This is her first serious exacerbation in several years.  She did  admit to  having "a little ham" over the Christmas holiday.  Thank you very much for allowing me to assist in the care of Ms. Julitza Rickles. It has been a pleasure to do so.  I will discuss her further care with you. DD:  05/26/00 TD:  05/26/00 Job: [id number]

## 2010-10-10 NOTE — Discharge Summary (Signed)
[place]. [facility]  Patient:    Rhonda [name], Rhonda [name]                       MRN: [id number] Adm. Date:  [id number] Disc. Date: 05/28/00 Attending:  Jetty Duhamel T                           Discharge Summary  PRIMARY CARE PHYSICIAN:  Carola J. [name], M.D.  CARDIOLOGIST:  Francisca December, M.D.  DISCHARGE DIAGNOSES:  1. Congestive heart failure exacerbation, etiology upper respiratory illness     versus dietary salt indiscretion.  2. Transiently elevated transaminases felt to be secondary to passive     congestion secondary to #1.  3. Normocytic anemia with discharge hemoglobin of 10.4 with MCV 90.5.     Work-up inconsistent with iron deficiency anemia.  Outpatient follow-up     required.  4. Cleft palate.  5. Hypothyroidism.  6. Severe obesity.  7. Tobacco abuse in the amount of four to five cigarettes per day x 25 years.  8. History of depression.  9. History of chronic and recurrent sinusitis. 10. Idiopathic and intermittent stomach spasms.     a. Colonoscopy June 2001 negative.     b. EGD June 2001 revealing hiatal hernia with mild gastroesophageal reflux        disease.     c. Esophageal menometry June 2001 revealing nonspecific motility disorder.  DISCHARGE MEDICATIONS:  1. K-Dur 20 mEq q.d.  2. Zaroxolyn 5 mg q.d.  3. Zestril 20 mg q.d.  4. Combivent two puffs b.i.d.  5. Effexor XR 75 mg one b.i.d.  6. Synthroid 88 mcg q.d.  7. Coreg 25 mg b.i.d.  8. Lasix 80 mg b.i.d.  9. Ambien 10 mg q.h.s. p.r.n. sleep. 10. Provera 10 mg q.d.  CONSULTATIONS:  Dr. Corliss Marcus with [facility].  PROCEDURES:  Nuclear medicine MUGA scan May 26, 2000:  Ejection fraction of approximately 35% with diffuse decreased contraction, particularly in the apical septal region.  HISTORY OF PRESENT ILLNESS:  Rhonda [name] is a 60 year old African-American female who presented to [facility] Emergency Room on date of admission with complaints of four day  history of increasing shortness of breath with dyspnea on exertion and orthopnea.  HOSPITAL COURSE:  #1 - CONGESTIVE HEART FAILURE EXACERBATION:  Chest x-ray at time of admission revealed a significant amount of pulmonary edema.  Review of systems did reveal symptoms consistent with upper respiratory viral type illness.  Full history also revealed significant consumption of salty ham over the past three to four days.  Patient was treated with IV Lasix and albuterol given a significant amount of wheeze and did improve considerably.  Is and Os and daily weights were followed closely and during hospitalization patient diuresed a total of approximately 7 pounds.  Dr. Amil Amen, the patients cardiologist, was consulted for evaluation and a MUGA scan was obtained with the results as listed above.  The patients chronic diuretic regimen was adjusted to include 80 mg of Lasix b.i.d. with Zaroxolyn and patient was discharged on this new regimen.  Arrangements were made for the patient to undergo outpatient cardiac rehabilitation at time of discharge.  Patient will continue to follow with Dr. Amil Amen for future cardiac issues.  There was no evidence of acute MI as an etiology of the patients CHF reexacerbation during this admission.  #2 - HYPOTHYROIDISM:  Patient was diagnosed with hypothyroidism approximately four  weeks prior to admission.  Follow up of TSH revealed that to be 3.99.  It was felt that in that the patient was four weeks status post initiation of therapy now was a prudent time to slightly increase her dosing of Synthroid and this adjustment was made.  At time of discharge patient is instructed to take 88 mcg q.d. of Synthroid and it is recommended that TSH be checked in four to six weeks status post discharge.  #3 - NORMOCYTIC ANEMIA:  At time of admission patients hemoglobin was found to be 9.5 with an MCV of 91.  Subsequent follow-ups revealed a hemoglobin that trend anywhere from  9.5-10.4 with last hemoglobin being 10.4 on date of discharge.  The patient had undergone a colonoscopy in June 2001 which was in fact unremarkable.  There was no evidence of GI bleeding during hospitalization.  Ferritin was found to be 34 with a low iron and low percent saturation.  All these findings were not felt to be consistent with iron deficiency anemia from GI blood loss and it was felt that this might represent a chronic anemia due to chronic inflammation with recurrent sinusitis.  It is recommended that CBC be followed up on an outpatient basis to be sure that the patients anemia is appropriately followed and then future work-up be considered.  #4 - TRANSIENTLY ELEVATED TRANSAMINASES:  At time of admission patient was noted to have somewhat elevated transaminases with an ALT of 93, AST of 83, and alkaline phosphatase of 155.  Patient had no symptoms to suggest abdominal pain or pathology.  It was felt that this most likely represented passive congestion from the patients significant CHF.  These were followed and did return to normal prior to the patients discharge.  #5 - TOBACCO ABUSE:  The patient was advised as to the detrimental effect of ongoing tobacco abuse and the advisability of her discontinuing this habit, especially considering her congestive heart failure.  CONDITION ON DISCHARGE:  At time of discharge patient was ambulating without significant difficulty.  Arrangements were made for follow-up with her primary care physician as well as her cardiologist.  Arrangements were made for her to undergo outpatient cardiac rehabilitation.  At time of discharge patient was stable on her feet and vital signs were stable. DD:  05/28/00 TD:  05/28/00 Job: [id number]

## 2010-10-10 NOTE — Discharge Summary (Signed)
NAMEANGELE, Rhonda [name]                ACCOUNT NO.:  [id number]   MEDICAL RECORD NO.:  [id number]          PATIENT TYPE:  INP   LOCATION:  5710                         FACILITY:  [facility]   PHYSICIAN:  Kela Millin, M.D.DATE OF BIRTH:  [birth year]   DATE OF ADMISSION:  03/23/2006  DATE OF DISCHARGE:                                 DISCHARGE SUMMARY   DISCHARGE DATE:   DISCHARGE DIAGNOSES:  1. Pseudomembranous colitis with focal necrosis, likely Clostridium      difficile.  2. Hypokalemia, secondary to number one, resolved.  3. Volume depletion, secondary to number one, improved.  4. Diabetes mellitus.  5. Nonischemic cardiomyopathy with a history of congestive heart failure.  6. History of asthma.  7. History of chronic and recurrent sinusitis.  8. Cleft palate.  9. Depression/anxiety.  10.History of migraines.  11.Tobacco abuse.  12.Hypothyroidism.  13.History of gastroesophageal reflux disease with hiatal hernia.  14.Hyperlipidemia.   PROCEDURES AND STUDIES:  1. CT scan of the abdomen and pelvis.  Slight prominence of mucosa of the      colon in several segments with mild stranding-like surface is noted,      particularly along the descending and sigmoid colon.  These findings      may indicate mild colitis but no significant colonic mucosal edema is      seen.  2. PICC line placement, on March 27, 2006.  3. Flexible sigmoidoscopy per Dr. Bosie Clos, on March 27, 2006.  Biopsy      shows pseudomembranous colitis with focal necrosis but likely      Clostridium difficile, not ischemia.   HISTORY:  The patient is a pleasant 60 year old black female with medical  problems as listed above who was directly admitted from Dr. Marya [name]  office with diarrhea.  It was reported that she had Clostridium difficile  colitis during the summer of 2007, and was treated as an outpatient.  It was  noted that she had recently had a course of erythromycin for sinusitis.  She  reported a  diarrhea whenever she ate or drank for the five days prior to  presentation.  Initially she had some vomiting as well.  She denied fevers  and chills.  She admitted to abdominal cramping.  The patient reported that  this was similar to the symptoms she had with C. diff during the summer.  At  Dr. Marya [name] office she was found to have a white cell count of 22 and  so was directly admitted to the [facility] for further  evaluation and management.   PHYSICAL EXAMINATION:  Upon admission, as per Dr. Lendell [name] revealed:  VITAL SIGNS:  Blood pressure 122/50, pulse of 80, respiratory rate of 20, O2  sat of 97%.  ABDOMEN:  Obese.  Normal bowel sounds.  Soft, mild lower abdominal  tenderness.  No rebound tenderness.  EXTREMITIES:  She had trace edema and the rest of her physical exam was  reported to be within normal limits.   LABORATORY DATA:  White cell count of 22, hemoglobin of 12, hematocrit 37,  platelet count of 418.  Sodium 139, potassium of 2.9, chloride 96, bicarb of  32, BUN of 18, creatinine 1.3, glucose of 141 and LFTs were within normal  limits except for an albumin of 2.   HOSPITAL COURSE:  1. Pseudomembranous colitis with focal necrosis likely Clostridium      difficile and not ischemia.  Upon admission the patient was empirically      placed on oral Flagyl and hydrated with IV fluids.  Her nausea,      vomiting resolved but the diarrhea persisted and so a CT scan of her      abdomen was done and the results as stated above.  It was also noted      that although her white cell count was not significantly improved, and      so following the CT scan gastroenterology was consulted and Dr.      Bosie Clos saw the patient.  A flex sig was done on March 27, 2006, and      on followup the patient still was having some diarrhea and so oral      vancomycin was added.  Following these interventions, she continued to      improve.  Her white cell count has decreased  significantly.  At the      time of this dictation it is 12.8.  She has remained afebrile.  She is      tolerating p.o. well but continues to have some diarrhea      intermittently.  The biopsies done during the flex significant showed      the pseudomembranous colitis as noted above with focal necrosis and per      Dr. Bosie Clos this is likely C. diff and not ischemia.  The patient will      be discharged home on oral antibiotics and this will be indicated at      the time of discharge.  The patient is to follow up with Dr. Gerri [name]      and Dr. Council Mechanic. Randa Evens as directed upon discharge.  2. Diabetes mellitus.  The patient's Accu-Check was monitored during her      hospital stay and she was covered with sliding scale insulin.  Oral      hypoglycemics to be resumed upon discharge.  3. Hypothyroidism.  The patient's TSH was rechecked during her hospital      stay and it was noted to be elevated at 9.055, and following this her      Synthroid was increased to 88 mcg (from 75 mcg) daily.  The patient is      to follow up with her primary care physician for further re-evaluation      and dose adjustment as-needed.  4. Hypokalemia.  Her potassium was replaced during her hospital stay and      her last potassium at the time of this dictation is 4.4.  5. Cardiomyopathy, history of CHF.  Initially, her Lasix was held while      the patient was being hydrated and subsequently as she has been      tolerating p.o. better, her Lasix has been restarted.  She is to      continue her outpatient medications upon discharge.   The patient's discharge medications as well as the followup care to be  indicated upon discharge by Dr. Nehemiah [name].      Kela Millin, M.D.  Electronically Signed     ACV/MEDQ  D:  03/30/2006  T:  03/30/2006  Job:  [id number]  cc:   Carola J. Rhonda [name], M.D.

## 2010-10-10 NOTE — Op Note (Signed)
NAME:  Rhonda [name], Rhonda [name]                          ACCOUNT NO.:  [id number]   MEDICAL RECORD NO.:  [id number]                   PATIENT TYPE:  AMB   LOCATION:  ENDO                                 FACILITY:  [facility]   PHYSICIAN:  James L. Malon Kindle., M.D.          DATE OF BIRTH:  Sep 29, 1950   DATE OF PROCEDURE:  11/28/2003  DATE OF DISCHARGE:                                 OPERATIVE REPORT   PROCEDURE:  Colonoscopy and polypectomy.   MEDICATIONS:  Fentanyl 100 mcg, Versed 11 mg IV.   INDICATIONS FOR PROCEDURE:  Strong family history of colon cancer in parents  and colon polyps in multiple members. This is done as a five year screening  colonoscopy.   DESCRIPTION OF PROCEDURE:  The procedure had been explained to the patient  and consent obtained. With the patient in the left lateral decubitus  position, the Olympus scope was inserted and advanced. A pediatric  adjustable scope was used.  We were able to easily advance to the cecum. The  ileocecal valve and appendiceal orifice were seen. The scope was withdrawn  and the cecum, ascending colon, and transverse colon were seen well and were  free of polyps.  In the proximal descending colon 80 cm from the anal verge,  a 1/2 cm semipedunculated polyp was encountered and was removed with the  snare and sucked through the scope. There was no active bleeding.  No other  polyps were seen in the descending or sigmoid colon. The rectum was free of  polyps.  The scope was withdrawn.  The patient tolerated the procedure well.   ASSESSMENT:  Descending colon polyp removed, 211.3.   PLAN:  Routine post polypectomy instructions. Will recommend repeating  colonoscopy in either 3-5 years depending on pathology report.                                               James L. Malon Kindle., M.D.    Waldron Session  D:  11/28/2003  T:  11/28/2003  Job:  [id number]   cc:   Otilio Connors. [name], M.D.  [address]  [place]  [place] [address]  Fax:  [phone]

## 2010-10-10 NOTE — Discharge Summary (Signed)
Rhonda [name], Rhonda [name]                ACCOUNT NO.:  [id number]   MEDICAL RECORD NO.:  [id number]          PATIENT TYPE:  INP   LOCATION:  1441                         FACILITY:  [facility]   PHYSICIAN:  Kela Millin, M.D.DATE OF BIRTH:  Sep 04, 1950   DATE OF ADMISSION:  07/01/2006  DATE OF DISCHARGE:  07/09/2006                               DISCHARGE SUMMARY   DISCHARGE DIAGNOSES:  1. Asthma exacerbation  2. Influenza.  3. Uncontrolled diabetes mellitus  4. Uncontrolled hypertension.  5. Recurrent sinusitis/history of chronic sinusitis.  6. Cleft palate.  7. History of nonischemic cardiomyopathy, with history of congestive      heart failure.  8. History of migraines.  9. History of depression/anxiety.  10.Tobacco abuse.  11.Hypothyroidism.  12.History of gastroesophageal reflux disease, with hiatal hernia.  13.History of hyperlipidemia.  14.History of normochromic anemia.  15.History of medical noncompliance.  16.Past history of pseudomembranous colitis.Marland Kitchen   PROCEDURES AND STUDIES:  CT scan of the head - no acute intracranial  abnormality, left sphenoid sinus opacification.   HISTORY:  That the patient is a 60 year old black female with above-  listed medical problems who presented with complaints of shortness of  breath, generalized malaise, and achiness.  She had seen her primary  care physician at the office, and influenza was suspected. The flu  screen was done, and it was positive, but the patient was complaining of  increased shortness of breath, and it was felt that her flu was likely  exacerbating her asthma, and so she came to the ER for further  evaluation.  In the ER, the patient had a chest x-ray which was negative  for acute infiltrates, and she was given nebulized bronchodilators and  placed on supplemental oxygen, with some improvement.  She was then  admitted to the [facility] for further evaluation and  management.  Upon admission, the patient  was complaining of a shortness  of breath and cough, with minimal sputum production.   PHYSICAL EXAMINATION UPON ADMISSION AS PER SENDIL Mordecai Rasmussen, M.D.:  VITAL SIGNS:  Temperature of 101, with a pulse of 83, blood pressure  177/90 initially, then down to 116/54, and O2 saturations of 97% on room  air.  HEENT:  She had dry mucous membranes.  LUNGS:  Bilateral expiratory wheezes and scattered crackles.  EXTREMITIES:  She had trace edema bilaterally.   LABORATORY DATA:  Her chest x-ray showed borderline cardiomegaly, no  active disease. Her sodium was 136, potassium 2.5, chloride 99,  bicarbonate 30, BUN 15, creatinine 0.9, glucose of 126. White cell count  6.8, hemoglobin 10.9, hematocrit 32.4, platelet count 441.   HOSPITAL COURSE:  1. Asthma exacerbation.  Upon admission, the patient was placed on      supplemental O2, nebulized bronchodilators, and started on IV      steroids.  The impression was that the influenza virus precipitated      her asthma exacerbation.  The patient gradually improved. The      shortness of breath resolved.  Her cough decreased, and she was      oxygenating well on  room air.  Her IV steroids were changed to      prednisone, and she will be discharged on a prednisone taper, she      was also switched from the nebulized bronchodilators to the MDIs,      and she will continue this upon discharge.  2. Influenza.  As discussed above, the patient was managed      symptomatically. She was placed on droplet isolation.  She      defervesced, and her myalgias/achiness resolved.  She has remained      afebrile and hemodynamically stable and will be discharged home at      this time.  3. Uncontrolled hypertension.  The patient's blood pressures were      elevated in the hospital up to a systolic of 192, and the      impression was that this was precipitated by the steroids.  Her      lisinopril was adjusted for better control, and clonidine also had      to be  added to better control her blood pressures.  She is to      continue this upon discharge and follow up with her primary care      physician for further adjustments as appropriate.  4. Uncontrolled diabetes mellitus.  The patient's blood pressures also      were elevated while in the hospital, and also this was attributed      to the steroids she was on.  She was placed on Lantus during her      hospital stay, and the dose was adjusted.  Her steroids were also      tapered.  She has been instructed to continue her Glucovance and      monitor her blood sugars, keep a log of them, and then follow up      with Dr. [name] before switching to the metformin only.  5. Hypokalemia.  Her potassium was replaced while she was in the      hospital.  6. History of nonischemic cardiomyopathy/history of CHF.  Compensated.      The patient was maintained on her outpatient medications during her      hospital stay.  7. Dizziness.  While in the hospital, the patient had an episode of      dizziness and reported that she had had this happen before while at      home.  She had a CT scan of her head done, and the results are as      stated above.  No acute intracranial abnormalities.  She was also      monitored on telemetry, and no arrhythmia were recorded.  She had      an EKG done as well, and this was negative for acute ischemic      changes, and the patient remained chest pain free during this      episode and her hospital stay.  The patient's symptoms improved,      and the workup was negative. She is to follow up with her primary      care physician.  8. Sinusitis, recurrent.  As discussed above, she had a CT scan of her      head, and it showed left sphenoid sinus opacification. It was noted      that the patient had a history of chronic/recurrent sinusitis.  I      discussed placing her on antibiotics, but she stated that she had  had Clostridium difficile in the past x2 and had to be  hospitalized      for a while as a result after she had taken antibiotics, and so she      did not want to take antibiotics at this time.  Her Claritin and      Rhinocort were resumed in the hospital, and she is to continue this      upon discharge and follow up with her primary care physician.   DISCHARGE MEDICATIONS:  1. Prednisone taper as directed.  2. Combivent 2 puffs q.i.d.  3. Lisinopril 20 mg p.o. b.i.d.  4. Clonidine 0.1 mg p.o. daily.  5. Humibid 1 tablet b.i.d. for cough.  6. Prilosec 20 mg daily.  7. The patient is to continue Rhinocort, Clarinex, Coreg, Glucovance,      and her other preadmission medications as instructed.   FOLLOW-UP CARE:  Dr. Carolin Coy in one week.  Patient to call for  appointment.   DISCHARGE CONDITION:  Improved/stable.      Kela Millin, M.D.  Electronically Signed     ACV/MEDQ  D:  07/09/2006  T:  07/09/2006  Job:  [id number]   cc:   Otilio Connors. [name], M.D.  Fax: [phone]

## 2010-10-10 NOTE — Discharge Summary (Signed)
NAMECONNER, NEISS                ACCOUNT NO.:  [id number]   MEDICAL RECORD NO.:  [id number]          PATIENT TYPE:  INP   LOCATION:  5710                         FACILITY:  [facility]   PHYSICIAN:  Deirdre Peer. Polite, M.D. DATE OF BIRTH:  06-22-1950   DATE OF ADMISSION:  03/23/2006  DATE OF DISCHARGE:  04/02/2006                               DISCHARGE SUMMARY   ADDENDUM:  This addendum includes medications.  Please see Dr. Salvatore Decent  dictation for other details.   DISCHARGE MEDICATIONS:  1. Vancomycin 125 mg 4 times a day.  2. Coreg 25 mg daily.  3. Klonopin 0.5 mg 2 times a day.  4. Claritin 10 mg daily.  5. Advair 2 times a day.  6. Paxil 50 mg daily.  7. Synthroid 75 mcg daily.  8. Glucophage 1.25/250 daily.  9. Lasix 20 mg b.i.d.  10.Lisinopril 20 mg daily.  11.Zaroxolyn 3 times a week.  12.Lipitor 40 mg daily.   FOLLOWUP:  Patient was scheduled for outpatient followup with her  primary M.D. in approximately 1 to 2 weeks and followup with [facility] on  November 30.      Deirdre Peer. Polite, M.D.  Electronically Signed     RDP/MEDQ  D:  05/19/2006  T:  05/20/2006  Job:  [id number]
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
## 2010-10-10 NOTE — Op Note (Signed)
NAME:  Rhonda [name], Rhonda [name]                          ACCOUNT NO.:  [id number]   MEDICAL RECORD NO.:  [id number]                   PATIENT TYPE:  AMB   LOCATION:  SDC                                  FACILITY:  [facility]   PHYSICIAN:  Howard C. Mezer, M.D.               DATE OF BIRTH:  August 29, 1950   DATE OF PROCEDURE:  01/03/2004  DATE OF DISCHARGE:                                 OPERATIVE REPORT   PREOPERATIVE DIAGNOSES:  Endometrial polyp and irregular bleeding.   POSTOPERATIVE DIAGNOSES:  Endometrial polyp and irregular bleeding.  Pending  pathology.   OPERATION:  Hysteroscopy D&C.   ANESTHESIA:  MAC plus paracervical block.   SURGEON:  Leatha Gilding. Mezer, M.D.   DESCRIPTION OF PROCEDURE:  With the patient in the lithotomy position, she  was prepped and draped in the routine fashion.  A paracervical block of 1%  Xylocaine was placed and the cervix easily dilated with minimal pressure.  The uterus sounded to 12 cm.  The hysteroscope was introduced and saline was  used as a distention medium. A large benign appearing polyp was noted in the  endometrial cavity and the remainder of the cavity appeared to be reasonably  normal. Visualization was limited secondary to the patient's obesity and  depth of the vagina and angle to pass the hysteroscope into the uterus.  A  significant amount of downward pressure on the posterior vagina was  required.  Grasping forceps were introduced and given the difficulty with  visualization and manipulation, the decision was made to remove the polyp  with a D&C and Randall stone forceps. The hysteroscope was removed, saline  had been used as a distention medium with minimal absorption. The saline was  hand pushed rather than pump pushed.  The cavity was sharply curetted  productive of a large amount of tissue and a polypoid material pattern.  Randall stone forceps introduced and additional polypoid tissue was  identified. There was minimal bleeding at the end of the  procedure. The  estimated blood loss was less than 20 mL.  The sponge, instrument and needle  counts were correct.  The patient tolerated the procedure well and was taken  to the recovery room in satisfactory condition.                                               Leatha Gilding. Mezer, M.D.    HCM/MEDQ  D:  01/03/2004  T:  01/03/2004  Job:  [id number]   cc:   Otilio Connors. [name], M.D.  [address]  [place]  [place] [address]  Fax: [phone]

## 2010-10-10 NOTE — Procedures (Signed)
[facility]  Patient:    Rhonda [name], Rhonda [name]                       MRN: [id number] Proc. Date: 11/05/99 Adm. Date:  [id number] Disc. Date: [id number] Attending:  Orland Mustard CC:         Carola J. [name], M.D.             Francisca December, M.D.                           Procedure Report  PROCEDURE:  Esophagogastroduodenoscopy  MEDICATIONS:  Hurricane spray, fentanyl 75 mcg, versed 8 mg IV.  INDICATION FOR PROCEDURE:  Epigastric discomfort going on for a month. Gallbladder tests were negative. This is done to evaluate for some other source of gastric discomfort.  DESCRIPTION OF PROCEDURE:  The procedure had been explained to the patient and consent obtained. With the patient in the left lateral decubitus position, the Olympus video endoscope was inserted blindly and advanced under direct vision. The stomach entered, pylorus identified and passed. The duodenum including the bulb and second portion were seen well and were unremarkable. The scope was withdrawn back in the stomach, antrum and body were normal. The fundus and cardia were seen well on retroflexed view and were normal. There was a small hiatal hernia in the distal esophagus that was somewhat red and no ulcerations. The proximal esophagus is normal. The scope withdrawn. The patient tolerated the procedure well. She was maintained on low flow oxygen and pulse oximeter throughout the procedure with no obvious problems.  ASSESSMENT:  Hiatal hernia with probable mild esophageal reflux.  PLAN: 1. Will give a trial of Nexium 40 mg daily. 2. Will proceed with colonoscopy as planned. DD:  11/05/99 TD:  11/07/99 Job: [id number]

## 2010-10-10 NOTE — H&P (Signed)
NAMEPEGGYANN, [name]                ACCOUNT NO.:  [id number]   MEDICAL RECORD NO.:  [id number]          PATIENT TYPE:  INP   LOCATION:  5710                         FACILITY:  [facility]   PHYSICIAN:  Corinna L. [name], MDDATE OF BIRTH:  08-13-1950   DATE OF ADMISSION:  03/23/2006  DATE OF DISCHARGE:                                HISTORY & PHYSICAL   CHIEF COMPLAINT:  Diarrhea.   HISTORY OF PRESENT ILLNESS:  Rhonda [name] is a pleasant 60 year old black female  patient of Dr. [name] who was directly admitted with diarrhea.  She has  multiple medical problems and had Clostridium difficile colitis this summer  which was treated as an outpatient.  She had a course of erythromycin  recently for sinusitis.  She has had diarrhea after each time she eats or  drinks for the past 5 days.  As a result, she has not eaten or drank much.  She also has some nausea.  Initially, she had some vomiting as well.  She  has had no fevers or chills.  She does have abdominal cramping.  This  episode seems similar to her previous bout of C. diff colitis.  She was seen  in Dr. [name] office and was noted to have a white blood cell count of  22,000.  She was felt to be dehydrated and therefore is being admitted.  She  has not taken any of her diuretics due to the diarrhea and her poor oral  intake.  She has had no hematochezia.   PAST MEDICAL HISTORY:  1. Asthma.  2. History of chronic and recurrent sinusitis.  3. Cleft palate.  4. Diabetes.  5. History of congestive heart failure.  6. Nonischemic cardiomyopathy.  7. Depression.  8. Anxiety.  9. Migraines.  10.Obesity.  11.Tobacco abuse.  12.Hypothyroidism.  13.Hiatal hernia with gastroesophageal reflux disease.  14.Hyperlipidemia.   MEDICATIONS:  1. Rhinocort.  2. Hydroxyzine 150 mg a day for anxiety.  3. Clarinex 5 mg a day.  4. Lisinopril 20 mg a day.  5. Klor-Con 20 mEq daily.  6. Synthroid 0.075 mg a day.  7. Zaroxolyn 5 mg three times  a week.  8. Lipitor 40 mg nightly.  9. Advair 250/50 1 puff b.i.d.  10.Glucovance 1.25/250 a day.  11.Lasix 20 mg twice a day.  12.Paxil CR 50 mg a day.  13.Topamax as needed for headache.  14.Vicodin as needed for knee pain.  15.Motrin 800 mg p.o. b.i.d. as needed for pain.  16.Ambien 5 mg nightly.  17.Klonopin 0.5 mg p.o. b.i.d.  18.Coreg CR does unknown.  19.Lyrica as needed for leg pain.  20.Albuterol MDI 2 puffs as needed for wheeze.   SOCIAL HISTORY:  Reviewed and as per previous.   FAMILY HISTORY:  Noncontributory.   REVIEW OF SYSTEMS:  As above otherwise negative.   PHYSICAL EXAMINATION:  Her temperature has not been recorded, pulse 80,  respiratory rate 20, blood pressure 122/50, oxygen saturation 97% on room  air.  GENERAL:  The patient is an obese, nontoxic-appearing black female in no  acute distress.  HEENT:  Normocephalic, atraumatic.  Pupils equal, round and reactive to  light.  Sclerae nonicteric.  Moist mucous membranes.  Cleft palate, no  thrush.  Neck is supple.  No JVD.  No lymphadenopathy.  LUNGS:  Clear to auscultation bilaterally without wheezes, rhonchi or rales.  CARDIOVASCULAR:  Regular rate and rhythm without murmurs, gallops or rubs.  ABDOMEN:  Obese.  Normal bowel sounds, soft, mild lower abdominal  tenderness.  No rebound tenderness.  GU AND RECTAL:  Deferred.  EXTREMITIES:  Trace edema.  Pulses are intact.  NEUROLOGIC:  Alert and oriented.  Cranial nerves and sensorimotor exam are  intact.  PSYCHIATRIC:  Normal affect.   LABS:  Done in her primary care physician's office today:  White blood cell  count of 22,000, hemoglobin 12, hematocrit 37, platelet count 418.  Sodium  139, potassium 2.9, chloride 96, bicarbonate 32, BUN 18, creatinine 1.3,  glucose 141.  Liver function tests normal except for an albumin of 2.   ASSESSMENT AND PLAN:  1. Diarrhea with poor oral intake and dehydration:  This is suspected      Clostridium difficile colitis.   I will place her on contact precaution,      check stool for Clostridium difficile.  Start empirically on Flagyl.      She will be on clear liquids for now.  I will hold her      antihypertensives, diuretics and give gentle IV hydration.  2. Hypokalemia:  This will be repleted IV and orally.  3. Diabetes:  For now, just give sliding scale insulin and hold her      Glucovance.  4. History of congestive heart failure, compensated, secondary to      idiopathic cardiomyopathy.  5. Gastroesophageal reflux disease.  6. Anxiety.  7. Depression.  8. Hypothyroidism.  9. Cleft palate.  10.Recurrent and chronic sinusitis.  11.Obesity.  12.History of migraines.      Corinna L. [name], MD  Electronically Signed     CLS/MEDQ  D:  03/23/2006  T:  03/23/2006  Job:  [id number]   cc:   Otilio Connors. [name], M.D.  Francisca December, M.D.

## 2010-10-10 NOTE — Consult Note (Signed)
Rhonda [name], Rhonda [name]                ACCOUNT NO.:  [id number]   MEDICAL RECORD NO.:  [id number]          PATIENT TYPE:  INP   LOCATION:  5710                         FACILITY:  [facility]   PHYSICIAN:  James L. Randa Evens, M.D. DATE OF BIRTH:  Mar 12, 1951   DATE OF CONSULTATION:  03/26/2006  DATE OF DISCHARGE:                                   CONSULTATION   REFERRING PHYSICIAN:  We were asked to do a consultation on Ms. Morozov by Dr.  Donnalee Curry.   REASON FOR CONSULTATION:  Abdominal pain and diarrhea.   HISTORY OF PRESENT ILLNESS:  This is a 60 year old obese female with  multiple medical problems including congestive heart failure, diabetes,  cleft palate, hyperthyroidism complaining of diarrhea and vomiting that  started on March 18, 2006.  On March 14, 2006 she started feeling  impacted and passing a beige-colored, coral-textured stool that was slimy  and mucousy for 4 days then her vomiting and diarrhea started.  Eating or  drinking anything now brings her diarrhea.  She was vomiting food but has  quit eating so now she is only vomiting green bile.  Prior to March 14, 2006 she had very regular bowel movements.  She is also complaining of  feeling like she has a knot in her throat.  It feels different to swallow.  She is positive for weight loss of 20 pounds in 2 weeks, positive for  abdominal cramps and rumbling movement in her stomach, positive for  occasional heartburn.  Negative for melena, negative for hematemesis,  negative for hematochezia, negative for abdominal bloating or distention.  According to the patient, negative new meds except Darvocet and antibiotics.  Negative for sick contacts.   PAST MEDICAL HISTORY:  Past medical history is significant for  hypothyroidism which is followed by her primary care doctor, Dr. [name],  congestive heart failure, asthma, cleft palate, recurrent URI, diabetes,  depression, anxiety, migraines, tobacco, GERD, hiatal hernia,  hyperlipidemia.   PAST SURGICAL HISTORY:  The only surgery she has had has been cleft palate  repair.  She has had two colonoscopies with Dr. Randa Evens.  He removed a  tubular adenoma in 2005.  She has had an EGD with Dr. Randa Evens in 2001 which  showed a hiatal hernia and mild reflux.  She has had esophageal manometry  with Dr. Randa Evens in 2001 and had a nonspecific motility disorder.   REVIEW OF SYSTEMS:  Review of systems is significant for right arm pain and  tenderness.  She claims it might be from her IV.   SOCIAL HISTORY:  Positive for tobacco, negative for alcohol, negative for  drugs.  She is married with two daughters.   FAMILY HISTORY:  Her family history is significant for her father who passed  from leukemia and also had a colostomy, she does not know if that was for  colon cancer or not.  Her mother had an intestinal obstruction.   CURRENT MEDICATIONS:  Current medications are many and include hydroxyzine,  Clarinex, lisinopril, Klor-Con, Synthroid, Zaroxolyn, Lipitor, Advair,  Glucovance, furosemide, Paxil, Topamax, hydrocodone, Motrin, Ambien,  clonazepam,  Rhinocort, Coreg CR, Lyrica and albuterol.  She says none of  these medications are new.   ALLERGIES:  SHE HAS ALLERGIES TO CEFTIN, ULTRAM, SULFA, AND ATIVAN.   PHYSICAL EXAMINATION:  GENERAL:  She is alert and oriented.  No apparent  distress.  VITAL SIGNS:  Her vital signs are stable.  CARDIOVASCULAR:  I was unable to hear due to her body habitus.  ABDOMEN:  Her abdomen is obese, distended, but she states this is her usual  size.  She has positive bowel sounds.  She is tender to palpation in the  left lower quadrant and the hypogastric area.  EXTREMITIES:  Extremities are somewhat edematous but again she states this  is normal for her.  SKIN:  Her skin has multiple approximately 1 cm discolorations on her face  and chest.  She reports these are probably acne.  No other rash or skin  lesions noted.   LABORATORIES:   Labs are significant for a white count of 21.9.  She is  Clostridium difficile negative x2 on stool toxin.   ASSESSMENT AND PLAN:  Dr. Carman Ching has examined the patient and taken a  history.  He describes this as a patient with 1 week of diarrhea, history of  Clostridium difficile, need to rule out Clostridium difficile in spite of  negative Clostridium difficile toxin.  We will check with flexible  sigmoidoscopy in the morning.  We will also check the results of CT scan.      Stephani Police, PA    ______________________________  Llana Aliment Randa Evens, M.D.    MLY/MEDQ  D:  03/26/2006  T:  03/27/2006  Job:  [id number]   cc:   Kela Millin, M.D.

## 2010-10-10 NOTE — H&P (Signed)
. Rhonda [name]  Patient:    Rhonda [name], Rhonda [name]                       MRN: [id number] Adm. Date:  [id number] Attending:  Hilario Quarry                         History and Physical  DATE OF BIRTH:  1950/11/10  PRIMARY CARE PHYSICIAN:  Dr. Otilio Connors. Westermann.  CARDIOLOGIST:  Dr. Francisca December.  CHIEF COMPLAINT:  Shortness of breath.  HISTORY OF PRESENT ILLNESS:  Rhonda [name] is a 60 year old African-American female who presents to the [facility] Emergency Room on referral from her primary care physicians office.  She has a significant history to include dilated cardiomyopathy of unknown cause.  For the last four days the patient has had increasing shortness of breath with dyspnea on exertion and orthopnea.  This has been associated with a sensation of diffuse chest "tightness" which is constant and not related to exertion.  There is no radiation of her chest tightness, and there is no numbness or tingling in the left arm.  There has been no diaphoresis associated with her shortness of breath.  The patient has a distinct sensation of smothering when lying down flat.  The patient specifically denies any exertional type substernal chest pain.  REVIEW OF SYSTEMS:  Chronic sinus congestion and recurrent sinus infections. Chronic fatigue related to hypothyroidism.  Back pain in the upper thoracic and mid lumbar distributions.  Generalized body aches x 6-7 days.  She denies pleuritic-type pain, cough, hemoptysis, change in bowel habits, constipation, diarrhea, melena, hematochezia, polyuria, dysuria, hematuria, headache, nausea or vomiting, abdominal pain, decreased appetite, prolonged trips in car, prolonged episodes of immobilization, unilateral calf pain or swelling or erythema, focal paralysis or paresthesia.  PAST MEDICAL HISTORY: 1. Dilated cardiomyopathy, idiopathic.  Negative Cardiolite September 2000. 2. Cleft palate. 3. Recently diagnosed  hypothyroidism. 4. Obesity. 5. Tobacco abuse in the amount of four to five cigarettes per day x 25+ years. 6. Depression. 7. Chronic and recurrent sinusitis.  8. Idiopathic and intermittent "stomach spasms."     a. Colonoscopy June 2001.     b. EGD June 2001, revealing hiatal hernia with mild gastroesophageal        reflux disease.     c. Esophageal manometry June 2001, revealing nonspecific motility        disorder.  MEDICATIONS:  1. Combivent b.i.d.  2. Zestril 20 mg q.d.  3. Effexor XR 75 mg b.i.d.  4. Zaroxolyn p.r.n.  5. Hydroxyzine 50 mg p.o. b.i.d. and q.h.s.  6. K-Dur 20 mEq q.d.  7. Lasix 40 mg 1 a.m., 2 p.m.  8. Coreg 25 mg p.o. b.i.d.  9. Ambien q.h.s. p.r.n. 10. Levaquin 500 mg p.o. q.d. x approximately 3+ weeks. 11. Levoxyl 75 mcg p.o. q.d. 12. ______ 1/2 tablet b.i.d. 13. Rhinocort p.r.n. 14. Darvocet-N 100 p.r.n. 15. Provera 10 mg p.o. q.d.  ALLERGIES:  The patient reports allergy to CEFTIN, ULTRAM, SULFA DRUGS, IBUPROFEN, and ATIVAN (IBUPROFEN is reported to cause GI irritability).  FAMILY HISTORY:  Positive for colon cancer, diabetes, coronary artery disease in the father, and mother with a history of hypothyroidism, hypertension, and breast cancer.  SOCIAL HISTORY:  The patient lives in [place].  She works part-time as a Loss adjuster, [employer].  She is married.  She has two children and lives at home  with her daughter and husband.  PHYSICAL EXAMINATION:  GENERAL:  Morbidly obese 60 year old African-American female who appears stated age and is obviously dyspneic.  VITAL SIGNS:  Temperature 99.7, blood pressure 165/91, heart rate 95, respiratory rate 26, room air O2 saturation 92%.  HEENT:  Normocephalic, atraumatic.  Pupils are equal, round, and reactive to light and accommodation.  Extraocular muscles intact.  Hearing intact grossly bilaterally.  Nasal mucosa pink and moist, without epistaxis or discharge. OC/OP obvious cleft palate with  pink and moist mucosa and no pharyngeal exudate.  NECK:  Obese, no JVD appreciable due to obesity.  No lymphadenopathy.  No thyromegaly.  LUNGS:  Crackles in bases bilaterally and one-third up bilateral fields with appreciable wheeze throughout.  CARDIOVASCULAR:  Regular rate and rhythm though heart sounds distant with no appreciable murmur.  ABDOMEN:  Obese, nontender, nondistended, soft.  Bowel sounds present.  No hepatosplenomegaly, though difficult to examine due to size.  EXTREMITIES:  With 2+ edema bilateral lower extremities to mid calf, with no focal erythema, no cords, and no calf tenderness to deep palpation, and 2+ dorsalis pedis pulses bilaterally.  NEUROLOGIC:  With 5/5 strength in bilateral upper and lower extremities. Cranial nerves II-XII intact bilaterally.  Intact sensation to light touch throughout.  Alert and oriented x 4.  ADMISSION LABORATORY DATA:  EKG revealing normal sinus rhythm with rate of 91 beats per minute and a chronic left bundle branch block when compared to old records.  Hemoglobin 9.5, MCV 91, white count 10.9, ANC 9.1, platelets 452.  Sodium 135, potassium 4.1, chloride 102, CO2 29, BUN 11, creatinine 1.0, glucose 133, calcium 8.2, total protein 7.3, albumin 2.6, AST 93, ALT 83, alkaline phosphatase 153, total bilirubin 0.7.  CK 93, CK-MB 1.0, troponin I less than 0.01.  PT 13.9, INR of 1.1, PTT 34.  Chest x-ray revealing bilateral pulmonary edema with no obvious focal infiltrate.  IMPRESSION AND PLAN: 1. Congestive heart failure exacerbation:  The patients admission chest x-ray    reveals clear pulmonary edema.  My exam concurs with this diagnosis.  The    patients report history supports this diagnosis as well, with dyspnea on    exertion, orthopnea, and constant chest heaviness which is unlike angina.    I will treat the patient with IV Lasix and albuterol given her significant    wheezing component.  I must consider myocardial  infarction as an etiology,    and I will, therefore, rule out with enzymes and electrocardiogram, with     credence to the fact that she does have recurrent left bundle branch block.    Full history reveals symptoms that are consistent with viral upper    respiratory infection, and this may, in fact, be the etiology of her    congestive heart failure exacerbation.  I will obtain an echocardiogram to    reevaluate her baseline congestive heart failure and use this for guidance    for further medical management.  The need for further cardiovascular workup    will be determined based upon the rule out for myocardial infarction, and I    consider consulting Dr. Amil Amen according to the results I obtain.  At this    time, there are no physical exam findings or findings on history that would    suggest that this was pulmonary embolus or deep vein thrombosis.  I will    recheck a thyroid stimulating hormone to assure that the patients thyroid    difficulties are not exacerbating  her problems. 2. Chest pain:  The patients symptoms are all consistent with shortness of    breath and pain/pressure related to pulmonary edema.  Her history is not    consistent with angina at this time.  Given, however, her history of a left    bundle branch block which makes electrocardiogram interpretation difficult,    I will proceed with rule out for myocardial infarction with enzymes.  The    first set of enzymes in the emergency room is all negative, as noted above. 3. Tobacco abuse:  I have counseled the patient on the need to discontinue    tobacco abuse and on the detrimental effects of ongoing use of tobacco    products. 4. Anemia:  The patients admission hemoglobin is 9.5, and MCV is normocytic.    It is unclear to me what the patients baseline hemoglobin is.  The patient    has no history of gastrointestinal bleed and did undergo a colonoscopy in    June 2001, which was unremarkable.  I will obtain an iron  panel and guaiac    her stools during this admission.  We will do a follow-up CBC in the    morning to assure that her hemoglobin is stable.  There is no evidence of    acute bleed at this time, and I would favor anemia of chronic disease as    the likely etiology, possibly related to chronic sinusitis and inflammatory    state. 5. Mildly elevated transaminases:  At this time the etiology for this finding    is unclear.  This might simply be related to mild hypoperfusion secondary    to her congestive heart failure exacerbation.  At this time, there is no    history of abdominal pain or nausea or vomiting.  I will follow up these    laboratories in the morning.  Additionally, I will add lipase to her    current laboratories.  However, the pattern of her transaminase elevation    is not consistent with cholecystitis or cholelithiasis.  Likewise, it is    not consistent with alcohol abuse either.  I will consider a hepatitis    panel if the elevation is persistent and there is no further information    to suggest possible etiology.DD:  05/24/00 TD:  05/24/00 Job: [id number]

## 2010-10-23 ENCOUNTER — Encounter (HOSPITAL_BASED_OUTPATIENT_CLINIC_OR_DEPARTMENT_OTHER): Payer: Medicare Other | Admitting: Oncology

## 2010-10-23 ENCOUNTER — Other Ambulatory Visit: Payer: Self-pay | Admitting: Oncology

## 2010-10-23 DIAGNOSIS — D0590 Unspecified type of carcinoma in situ of unspecified breast: Secondary | ICD-10-CM

## 2010-10-23 DIAGNOSIS — C50919 Malignant neoplasm of unspecified site of unspecified female breast: Secondary | ICD-10-CM

## 2010-10-23 DIAGNOSIS — Z5111 Encounter for antineoplastic chemotherapy: Secondary | ICD-10-CM

## 2010-10-23 LAB — CBC WITH DIFFERENTIAL/PLATELET
BASO%: 0.1 % (ref 0.0–2.0)
Basophils Absolute: 0 10*3/uL (ref 0.0–0.1)
EOS%: 0 % (ref 0.0–7.0)
Eosinophils Absolute: 0 10*3/uL (ref 0.0–0.5)
HCT: 32.1 % — ABNORMAL LOW (ref 34.8–46.6)
HGB: 10.8 g/dL — ABNORMAL LOW (ref 11.6–15.9)
LYMPH%: 7.9 % — ABNORMAL LOW (ref 14.0–49.7)
MCH: 33.2 pg (ref 25.1–34.0)
MCHC: 33.6 g/dL (ref 31.5–36.0)
MCV: 98.8 fL (ref 79.5–101.0)
MONO#: 0.4 10*3/uL (ref 0.1–0.9)
MONO%: 2.7 % (ref 0.0–14.0)
NEUT#: 13 10*3/uL — ABNORMAL HIGH (ref 1.5–6.5)
NEUT%: 89.3 % — ABNORMAL HIGH (ref 38.4–76.8)
Platelets: 281 10*3/uL (ref 145–400)
RBC: 3.25 10*6/uL — ABNORMAL LOW (ref 3.70–5.45)
RDW: 17.3 % — ABNORMAL HIGH (ref 11.2–14.5)
WBC: 14.6 10*3/uL — ABNORMAL HIGH (ref 3.9–10.3)
lymph#: 1.2 10*3/uL (ref 0.9–3.3)
nRBC: 0 % (ref 0–0)

## 2010-10-23 LAB — COMPREHENSIVE METABOLIC PANEL
ALT: 19 U/L (ref 0–35)
AST: 14 U/L (ref 0–37)
Albumin: 3.8 g/dL (ref 3.5–5.2)
Alkaline Phosphatase: 106 U/L (ref 39–117)
BUN: 26 mg/dL — ABNORMAL HIGH (ref 6–23)
CO2: 24 mEq/L (ref 19–32)
Calcium: 9.2 mg/dL (ref 8.4–10.5)
Chloride: 103 mEq/L (ref 96–112)
Creatinine, Ser: 1.12 mg/dL (ref 0.40–1.20)
Glucose, Bld: 184 mg/dL — ABNORMAL HIGH (ref 70–99)
Potassium: 4.1 mEq/L (ref 3.5–5.3)
Sodium: 140 mEq/L (ref 135–145)
Total Bilirubin: 0.3 mg/dL (ref 0.3–1.2)
Total Protein: 6.7 g/dL (ref 6.0–8.3)

## 2010-10-24 ENCOUNTER — Encounter (HOSPITAL_BASED_OUTPATIENT_CLINIC_OR_DEPARTMENT_OTHER): Payer: Medicare Other | Admitting: Oncology

## 2010-10-24 DIAGNOSIS — Z5189 Encounter for other specified aftercare: Secondary | ICD-10-CM

## 2010-10-24 DIAGNOSIS — C50919 Malignant neoplasm of unspecified site of unspecified female breast: Secondary | ICD-10-CM

## 2010-10-29 ENCOUNTER — Ambulatory Visit: Payer: PRIVATE HEALTH INSURANCE | Admitting: Psychiatry

## 2010-10-30 ENCOUNTER — Encounter (HOSPITAL_BASED_OUTPATIENT_CLINIC_OR_DEPARTMENT_OTHER): Payer: Medicare Other | Admitting: Oncology

## 2010-10-30 ENCOUNTER — Other Ambulatory Visit: Payer: Self-pay | Admitting: Oncology

## 2010-10-30 DIAGNOSIS — D0590 Unspecified type of carcinoma in situ of unspecified breast: Secondary | ICD-10-CM

## 2010-10-30 LAB — BASIC METABOLIC PANEL
BUN: 34 mg/dL — ABNORMAL HIGH (ref 6–23)
CO2: 26 mEq/L (ref 19–32)
Calcium: 9 mg/dL (ref 8.4–10.5)
Chloride: 102 mEq/L (ref 96–112)
Creatinine, Ser: 1.16 mg/dL — ABNORMAL HIGH (ref 0.50–1.10)
Glucose, Bld: 152 mg/dL — ABNORMAL HIGH (ref 70–99)
Potassium: 4.2 mEq/L (ref 3.5–5.3)
Sodium: 139 mEq/L (ref 135–145)

## 2010-10-30 LAB — CBC WITH DIFFERENTIAL/PLATELET
BASO%: 0.2 % (ref 0.0–2.0)
Basophils Absolute: 0 10*3/uL (ref 0.0–0.1)
EOS%: 1.3 % (ref 0.0–7.0)
Eosinophils Absolute: 0.1 10*3/uL (ref 0.0–0.5)
HCT: 29.4 % — ABNORMAL LOW (ref 34.8–46.6)
HGB: 9.7 g/dL — ABNORMAL LOW (ref 11.6–15.9)
LYMPH%: 26.2 % (ref 14.0–49.7)
MCH: 32.4 pg (ref 25.1–34.0)
MCHC: 33 g/dL (ref 31.5–36.0)
MCV: 98.3 fL (ref 79.5–101.0)
MONO#: 0.9 10*3/uL (ref 0.1–0.9)
MONO%: 19.6 % — ABNORMAL HIGH (ref 0.0–14.0)
NEUT#: 2.4 10*3/uL (ref 1.5–6.5)
NEUT%: 52.7 % (ref 38.4–76.8)
Platelets: 161 10*3/uL (ref 145–400)
RBC: 2.99 10*6/uL — ABNORMAL LOW (ref 3.70–5.45)
RDW: 16.7 % — ABNORMAL HIGH (ref 11.2–14.5)
WBC: 4.5 10*3/uL (ref 3.9–10.3)
lymph#: 1.2 10*3/uL (ref 0.9–3.3)
nRBC: 0 % (ref 0–0)

## 2010-11-05 ENCOUNTER — Ambulatory Visit (INDEPENDENT_AMBULATORY_CARE_PROVIDER_SITE_OTHER): Payer: Medicare Other | Admitting: Psychiatry

## 2010-11-05 DIAGNOSIS — Z638 Other specified problems related to primary support group: Secondary | ICD-10-CM

## 2010-11-05 DIAGNOSIS — F0630 Mood disorder due to known physiological condition, unspecified: Secondary | ICD-10-CM

## 2010-11-13 ENCOUNTER — Other Ambulatory Visit: Payer: Self-pay | Admitting: Oncology

## 2010-11-13 ENCOUNTER — Encounter (HOSPITAL_BASED_OUTPATIENT_CLINIC_OR_DEPARTMENT_OTHER): Payer: Medicare Other | Admitting: Oncology

## 2010-11-13 DIAGNOSIS — C50919 Malignant neoplasm of unspecified site of unspecified female breast: Secondary | ICD-10-CM

## 2010-11-13 DIAGNOSIS — Z5111 Encounter for antineoplastic chemotherapy: Secondary | ICD-10-CM

## 2010-11-13 DIAGNOSIS — D0590 Unspecified type of carcinoma in situ of unspecified breast: Secondary | ICD-10-CM

## 2010-11-13 DIAGNOSIS — C50419 Malignant neoplasm of upper-outer quadrant of unspecified female breast: Secondary | ICD-10-CM

## 2010-11-13 DIAGNOSIS — Z5189 Encounter for other specified aftercare: Secondary | ICD-10-CM

## 2010-11-13 LAB — CBC WITH DIFFERENTIAL/PLATELET
BASO%: 0 % (ref 0.0–2.0)
Basophils Absolute: 0 10*3/uL (ref 0.0–0.1)
EOS%: 0 % (ref 0.0–7.0)
Eosinophils Absolute: 0 10*3/uL (ref 0.0–0.5)
HCT: 31.9 % — ABNORMAL LOW (ref 34.8–46.6)
HGB: 10.3 g/dL — ABNORMAL LOW (ref 11.6–15.9)
LYMPH%: 9.6 % — ABNORMAL LOW (ref 14.0–49.7)
MCH: 33.3 pg (ref 25.1–34.0)
MCHC: 32.3 g/dL (ref 31.5–36.0)
MCV: 103.2 fL — ABNORMAL HIGH (ref 79.5–101.0)
MONO#: 0 10*3/uL — ABNORMAL LOW (ref 0.1–0.9)
MONO%: 0.4 % (ref 0.0–14.0)
NEUT#: 7.6 10*3/uL — ABNORMAL HIGH (ref 1.5–6.5)
NEUT%: 90 % — ABNORMAL HIGH (ref 38.4–76.8)
Platelets: 281 10*3/uL (ref 145–400)
RBC: 3.09 10*6/uL — ABNORMAL LOW (ref 3.70–5.45)
RDW: 18.8 % — ABNORMAL HIGH (ref 11.2–14.5)
WBC: 8.5 10*3/uL (ref 3.9–10.3)
lymph#: 0.8 10*3/uL — ABNORMAL LOW (ref 0.9–3.3)
nRBC: 0 % (ref 0–0)

## 2010-11-13 LAB — URINALYSIS, MICROSCOPIC - CHCC
Bilirubin (Urine): NEGATIVE
Blood: NEGATIVE
Ketones: NEGATIVE mg/dL
Nitrite: NEGATIVE
Protein: NEGATIVE mg/dL
RBC count: NEGATIVE (ref 0–2)
Specific Gravity, Urine: 1.015 (ref 1.003–1.035)
pH: 6 (ref 4.6–8.0)

## 2010-11-13 LAB — COMPREHENSIVE METABOLIC PANEL
ALT: 22 U/L (ref 0–35)
AST: 22 U/L (ref 0–37)
Albumin: 3.9 g/dL (ref 3.5–5.2)
Alkaline Phosphatase: 107 U/L (ref 39–117)
BUN: 18 mg/dL (ref 6–23)
CO2: 23 mEq/L (ref 19–32)
Calcium: 9 mg/dL (ref 8.4–10.5)
Chloride: 105 mEq/L (ref 96–112)
Creatinine, Ser: 0.86 mg/dL (ref 0.50–1.10)
Glucose, Bld: 191 mg/dL — ABNORMAL HIGH (ref 70–99)
Potassium: 4.2 mEq/L (ref 3.5–5.3)
Sodium: 139 mEq/L (ref 135–145)
Total Bilirubin: 0.3 mg/dL (ref 0.3–1.2)
Total Protein: 6.5 g/dL (ref 6.0–8.3)

## 2010-11-14 ENCOUNTER — Encounter (HOSPITAL_BASED_OUTPATIENT_CLINIC_OR_DEPARTMENT_OTHER): Payer: Medicare Other | Admitting: Oncology

## 2010-11-14 DIAGNOSIS — Z5189 Encounter for other specified aftercare: Secondary | ICD-10-CM

## 2010-11-14 DIAGNOSIS — C50919 Malignant neoplasm of unspecified site of unspecified female breast: Secondary | ICD-10-CM

## 2010-11-15 LAB — URINE CULTURE

## 2010-11-20 ENCOUNTER — Encounter (HOSPITAL_BASED_OUTPATIENT_CLINIC_OR_DEPARTMENT_OTHER): Payer: Medicare Other | Admitting: Oncology

## 2010-11-20 ENCOUNTER — Other Ambulatory Visit: Payer: Self-pay | Admitting: Oncology

## 2010-11-20 DIAGNOSIS — C50419 Malignant neoplasm of upper-outer quadrant of unspecified female breast: Secondary | ICD-10-CM

## 2010-11-20 DIAGNOSIS — D0590 Unspecified type of carcinoma in situ of unspecified breast: Secondary | ICD-10-CM

## 2010-11-20 LAB — BASIC METABOLIC PANEL
BUN: 26 mg/dL — ABNORMAL HIGH (ref 6–23)
CO2: 27 mEq/L (ref 19–32)
Calcium: 8.9 mg/dL (ref 8.4–10.5)
Chloride: 99 mEq/L (ref 96–112)
Creatinine, Ser: 1.12 mg/dL — ABNORMAL HIGH (ref 0.50–1.10)
Glucose, Bld: 172 mg/dL — ABNORMAL HIGH (ref 70–99)
Potassium: 4 mEq/L (ref 3.5–5.3)
Sodium: 137 mEq/L (ref 135–145)

## 2010-11-20 LAB — CBC WITH DIFFERENTIAL/PLATELET
BASO%: 0.2 % (ref 0.0–2.0)
Basophils Absolute: 0 10*3/uL (ref 0.0–0.1)
EOS%: 1 % (ref 0.0–7.0)
Eosinophils Absolute: 0.1 10*3/uL (ref 0.0–0.5)
HCT: 29.3 % — ABNORMAL LOW (ref 34.8–46.6)
HGB: 9.9 g/dL — ABNORMAL LOW (ref 11.6–15.9)
LYMPH%: 21.9 % (ref 14.0–49.7)
MCH: 35.7 pg — ABNORMAL HIGH (ref 25.1–34.0)
MCHC: 33.7 g/dL (ref 31.5–36.0)
MCV: 105.8 fL — ABNORMAL HIGH (ref 79.5–101.0)
MONO#: 0.8 10*3/uL (ref 0.1–0.9)
MONO%: 11.9 % (ref 0.0–14.0)
NEUT#: 4.6 10*3/uL (ref 1.5–6.5)
NEUT%: 65 % (ref 38.4–76.8)
Platelets: 234 10*3/uL (ref 145–400)
RBC: 2.77 10*6/uL — ABNORMAL LOW (ref 3.70–5.45)
RDW: 19.5 % — ABNORMAL HIGH (ref 11.2–14.5)
WBC: 7.1 10*3/uL (ref 3.9–10.3)
lymph#: 1.6 10*3/uL (ref 0.9–3.3)

## 2010-12-03 ENCOUNTER — Ambulatory Visit: Payer: Medicare Other | Admitting: Psychiatry

## 2010-12-04 ENCOUNTER — Other Ambulatory Visit: Payer: Self-pay | Admitting: Oncology

## 2010-12-04 ENCOUNTER — Encounter (HOSPITAL_BASED_OUTPATIENT_CLINIC_OR_DEPARTMENT_OTHER): Payer: Medicare Other | Admitting: Oncology

## 2010-12-04 DIAGNOSIS — D0590 Unspecified type of carcinoma in situ of unspecified breast: Secondary | ICD-10-CM

## 2010-12-04 DIAGNOSIS — C50419 Malignant neoplasm of upper-outer quadrant of unspecified female breast: Secondary | ICD-10-CM

## 2010-12-04 LAB — CBC WITH DIFFERENTIAL/PLATELET
BASO%: 0.1 % (ref 0.0–2.0)
Basophils Absolute: 0 10*3/uL (ref 0.0–0.1)
EOS%: 0 % (ref 0.0–7.0)
Eosinophils Absolute: 0 10*3/uL (ref 0.0–0.5)
HCT: 27.6 % — ABNORMAL LOW (ref 34.8–46.6)
HGB: 9 g/dL — ABNORMAL LOW (ref 11.6–15.9)
LYMPH%: 11.2 % — ABNORMAL LOW (ref 14.0–49.7)
MCH: 34.7 pg — ABNORMAL HIGH (ref 25.1–34.0)
MCHC: 32.6 g/dL (ref 31.5–36.0)
MCV: 106.6 fL — ABNORMAL HIGH (ref 79.5–101.0)
MONO#: 0.6 10*3/uL (ref 0.1–0.9)
MONO%: 5.8 % (ref 0.0–14.0)
NEUT#: 8.1 10*3/uL — ABNORMAL HIGH (ref 1.5–6.5)
NEUT%: 82.9 % — ABNORMAL HIGH (ref 38.4–76.8)
Platelets: 258 10*3/uL (ref 145–400)
RBC: 2.59 10*6/uL — ABNORMAL LOW (ref 3.70–5.45)
RDW: 17.4 % — ABNORMAL HIGH (ref 11.2–14.5)
WBC: 9.8 10*3/uL (ref 3.9–10.3)
lymph#: 1.1 10*3/uL (ref 0.9–3.3)
nRBC: 0 % (ref 0–0)

## 2010-12-11 ENCOUNTER — Other Ambulatory Visit: Payer: Self-pay | Admitting: Oncology

## 2010-12-11 ENCOUNTER — Encounter (HOSPITAL_BASED_OUTPATIENT_CLINIC_OR_DEPARTMENT_OTHER): Payer: Medicare Other | Admitting: Oncology

## 2010-12-11 DIAGNOSIS — D0590 Unspecified type of carcinoma in situ of unspecified breast: Secondary | ICD-10-CM

## 2010-12-11 DIAGNOSIS — C50419 Malignant neoplasm of upper-outer quadrant of unspecified female breast: Secondary | ICD-10-CM

## 2010-12-11 LAB — BASIC METABOLIC PANEL
BUN: 21 mg/dL (ref 6–23)
CO2: 26 mEq/L (ref 19–32)
Calcium: 8.6 mg/dL (ref 8.4–10.5)
Chloride: 101 mEq/L (ref 96–112)
Creatinine, Ser: 1.14 mg/dL — ABNORMAL HIGH (ref 0.50–1.10)
Glucose, Bld: 88 mg/dL (ref 70–99)
Potassium: 4 mEq/L (ref 3.5–5.3)
Sodium: 140 mEq/L (ref 135–145)

## 2010-12-11 LAB — CBC WITH DIFFERENTIAL/PLATELET
BASO%: 0.3 % (ref 0.0–2.0)
Basophils Absolute: 0 10*3/uL (ref 0.0–0.1)
EOS%: 4.8 % (ref 0.0–7.0)
Eosinophils Absolute: 0.4 10*3/uL (ref 0.0–0.5)
HCT: 29.6 % — ABNORMAL LOW (ref 34.8–46.6)
HGB: 9.7 g/dL — ABNORMAL LOW (ref 11.6–15.9)
LYMPH%: 19.8 % (ref 14.0–49.7)
MCH: 36.3 pg — ABNORMAL HIGH (ref 25.1–34.0)
MCHC: 32.8 g/dL (ref 31.5–36.0)
MCV: 110.6 fL — ABNORMAL HIGH (ref 79.5–101.0)
MONO#: 0.6 10*3/uL (ref 0.1–0.9)
MONO%: 7.6 % (ref 0.0–14.0)
NEUT#: 5.2 10*3/uL (ref 1.5–6.5)
NEUT%: 67.5 % (ref 38.4–76.8)
Platelets: 235 10*3/uL (ref 145–400)
RBC: 2.68 10*6/uL — ABNORMAL LOW (ref 3.70–5.45)
RDW: 18.4 % — ABNORMAL HIGH (ref 11.2–14.5)
WBC: 7.7 10*3/uL (ref 3.9–10.3)
lymph#: 1.5 10*3/uL (ref 0.9–3.3)

## 2010-12-17 ENCOUNTER — Ambulatory Visit
Admission: RE | Admit: 2010-12-17 | Discharge: 2010-12-17 | Disposition: A | Discharge status: Home / Self Care | Payer: Medicare Other | Source: Ambulatory Visit | Attending: Radiation Oncology | Admitting: Radiation Oncology

## 2010-12-17 ENCOUNTER — Ambulatory Visit: Payer: PRIVATE HEALTH INSURANCE | Admitting: Psychiatry

## 2010-12-17 DIAGNOSIS — C50919 Malignant neoplasm of unspecified site of unspecified female breast: Secondary | ICD-10-CM | POA: Insufficient documentation

## 2010-12-25 ENCOUNTER — Ambulatory Visit: Payer: PRIVATE HEALTH INSURANCE | Admitting: Psychiatry

## 2011-01-08 ENCOUNTER — Ambulatory Visit (INDEPENDENT_AMBULATORY_CARE_PROVIDER_SITE_OTHER): Payer: Medicare Other | Admitting: Psychiatry

## 2011-01-08 DIAGNOSIS — Z638 Other specified problems related to primary support group: Secondary | ICD-10-CM

## 2011-01-08 DIAGNOSIS — F0630 Mood disorder due to known physiological condition, unspecified: Secondary | ICD-10-CM

## 2011-01-14 ENCOUNTER — Institutional Professional Consult (permissible substitution): Payer: PRIVATE HEALTH INSURANCE | Admitting: Critical Care Medicine

## 2011-02-04 ENCOUNTER — Institutional Professional Consult (permissible substitution): Payer: PRIVATE HEALTH INSURANCE | Admitting: Critical Care Medicine

## 2011-02-13 ENCOUNTER — Institutional Professional Consult (permissible substitution): Payer: PRIVATE HEALTH INSURANCE | Admitting: Critical Care Medicine

## 2011-02-20 ENCOUNTER — Encounter (HOSPITAL_BASED_OUTPATIENT_CLINIC_OR_DEPARTMENT_OTHER): Payer: Medicare Other | Admitting: Oncology

## 2011-02-20 DIAGNOSIS — C50419 Malignant neoplasm of upper-outer quadrant of unspecified female breast: Secondary | ICD-10-CM

## 2011-02-20 DIAGNOSIS — Z452 Encounter for adjustment and management of vascular access device: Secondary | ICD-10-CM

## 2011-02-20 DIAGNOSIS — D0590 Unspecified type of carcinoma in situ of unspecified breast: Secondary | ICD-10-CM

## 2011-02-25 LAB — URINALYSIS, ROUTINE W REFLEX MICROSCOPIC
Bilirubin Urine: NEGATIVE
Glucose, UA: NEGATIVE
Hgb urine dipstick: NEGATIVE
Ketones, ur: NEGATIVE
Nitrite: NEGATIVE
Protein, ur: NEGATIVE
Specific Gravity, Urine: 1.017
Urobilinogen, UA: 0.2
pH: 6

## 2011-02-25 LAB — CBC
HCT: 36.3
Hemoglobin: 12
MCHC: 33.1
MCV: 91.5
Platelets: 406 — ABNORMAL HIGH
RBC: 3.96
RDW: 15.1
WBC: 10.1

## 2011-02-25 LAB — DIFFERENTIAL
Basophils Absolute: 0
Basophils Relative: 0
Eosinophils Absolute: 0.4
Eosinophils Relative: 4
Lymphocytes Relative: 15
Lymphs Abs: 1.5
Monocytes Absolute: 0.6
Monocytes Relative: 6
Neutro Abs: 7.5
Neutrophils Relative %: 74

## 2011-02-25 LAB — POCT I-STAT, CHEM 8
BUN: 37 — ABNORMAL HIGH
Calcium, Ion: 1.19
Chloride: 103
Creatinine, Ser: 1.4 — ABNORMAL HIGH
Glucose, Bld: 151 — ABNORMAL HIGH
HCT: 39
Hemoglobin: 13.3
Potassium: 4
Sodium: 139
TCO2: 29

## 2011-02-25 LAB — POCT CARDIAC MARKERS
CKMB, poc: 2.2
Myoglobin, poc: 427
Troponin i, poc: <0.05

## 2011-02-25 LAB — URINE CULTURE: Colony Count: 2000

## 2011-02-25 LAB — PROTIME-INR
INR: 1
Prothrombin Time: 13.6

## 2011-02-25 LAB — GLUCOSE, CAPILLARY: Glucose-Capillary: 150 — ABNORMAL HIGH

## 2011-02-25 LAB — APTT: aPTT: 30

## 2011-02-25 LAB — POCT PREGNANCY, URINE: Preg Test, Ur: NEGATIVE

## 2011-03-06 ENCOUNTER — Ambulatory Visit: Payer: Medicare Other | Admitting: Radiation Oncology

## 2011-03-20 ENCOUNTER — Ambulatory Visit
Admission: RE | Admit: 2011-03-20 | Discharge: 2011-03-20 | Disposition: A | Discharge status: Home / Self Care | Payer: Medicare Other | Source: Ambulatory Visit | Attending: Radiation Oncology | Admitting: Radiation Oncology

## 2011-03-26 ENCOUNTER — Telehealth: Payer: Self-pay | Admitting: *Deleted

## 2011-03-26 ENCOUNTER — Encounter: Payer: Self-pay | Admitting: Oncology

## 2011-03-26 ENCOUNTER — Other Ambulatory Visit: Payer: Self-pay | Admitting: Oncology

## 2011-03-26 ENCOUNTER — Ambulatory Visit (INDEPENDENT_AMBULATORY_CARE_PROVIDER_SITE_OTHER): Payer: Medicare Other | Admitting: Psychiatry

## 2011-03-26 ENCOUNTER — Ambulatory Visit (HOSPITAL_BASED_OUTPATIENT_CLINIC_OR_DEPARTMENT_OTHER): Payer: 59 | Admitting: Oncology

## 2011-03-26 DIAGNOSIS — C50919 Malignant neoplasm of unspecified site of unspecified female breast: Secondary | ICD-10-CM

## 2011-03-26 DIAGNOSIS — Z638 Other specified problems related to primary support group: Secondary | ICD-10-CM

## 2011-03-26 DIAGNOSIS — F0630 Mood disorder due to known physiological condition, unspecified: Secondary | ICD-10-CM

## 2011-03-26 DIAGNOSIS — Z5189 Encounter for other specified aftercare: Secondary | ICD-10-CM

## 2011-03-26 DIAGNOSIS — Z452 Encounter for adjustment and management of vascular access device: Secondary | ICD-10-CM

## 2011-03-26 DIAGNOSIS — C50419 Malignant neoplasm of upper-outer quadrant of unspecified female breast: Secondary | ICD-10-CM

## 2011-03-26 DIAGNOSIS — D0590 Unspecified type of carcinoma in situ of unspecified breast: Secondary | ICD-10-CM

## 2011-03-26 DIAGNOSIS — Z5111 Encounter for antineoplastic chemotherapy: Secondary | ICD-10-CM

## 2011-03-26 DIAGNOSIS — E559 Vitamin D deficiency, unspecified: Secondary | ICD-10-CM

## 2011-03-26 HISTORY — DX: Malignant neoplasm of unspecified site of unspecified female breast: C50.919

## 2011-03-26 HISTORY — DX: Vitamin D deficiency, unspecified: E55.9

## 2011-03-26 LAB — CBC WITH DIFFERENTIAL/PLATELET
BASO%: 0.4 % (ref 0.0–2.0)
Basophils Absolute: 0 10*3/uL (ref 0.0–0.1)
EOS%: 6.4 % (ref 0.0–7.0)
Eosinophils Absolute: 0.6 10*3/uL — ABNORMAL HIGH (ref 0.0–0.5)
HCT: 31.6 % — ABNORMAL LOW (ref 34.8–46.6)
HGB: 10.3 g/dL — ABNORMAL LOW (ref 11.6–15.9)
LYMPH%: 11.2 % — ABNORMAL LOW (ref 14.0–49.7)
MCH: 32.1 pg (ref 25.1–34.0)
MCHC: 32.6 g/dL (ref 31.5–36.0)
MCV: 98.4 fL (ref 79.5–101.0)
MONO#: 0.4 10*3/uL (ref 0.1–0.9)
MONO%: 4.1 % (ref 0.0–14.0)
NEUT#: 7.5 10*3/uL — ABNORMAL HIGH (ref 1.5–6.5)
NEUT%: 77.9 % — ABNORMAL HIGH (ref 38.4–76.8)
Platelets: 303 10*3/uL (ref 145–400)
RBC: 3.21 10*6/uL — ABNORMAL LOW (ref 3.70–5.45)
RDW: 15 % — ABNORMAL HIGH (ref 11.2–14.5)
WBC: 9.7 10*3/uL (ref 3.9–10.3)
lymph#: 1.1 10*3/uL (ref 0.9–3.3)
nRBC: 0 % (ref 0–0)

## 2011-03-26 LAB — COMPREHENSIVE METABOLIC PANEL
ALT: 12 U/L (ref 0–35)
AST: 12 U/L (ref 0–37)
Albumin: 3.7 g/dL (ref 3.5–5.2)
Alkaline Phosphatase: 118 U/L — ABNORMAL HIGH (ref 39–117)
BUN: 21 mg/dL (ref 6–23)
CO2: 25 mEq/L (ref 19–32)
Calcium: 8.9 mg/dL (ref 8.4–10.5)
Chloride: 105 mEq/L (ref 96–112)
Creatinine, Ser: 1.19 mg/dL — ABNORMAL HIGH (ref 0.50–1.10)
Glucose, Bld: 116 mg/dL — ABNORMAL HIGH (ref 70–99)
Potassium: 3.9 mEq/L (ref 3.5–5.3)
Sodium: 141 mEq/L (ref 135–145)
Total Bilirubin: 0.3 mg/dL (ref 0.3–1.2)
Total Protein: 6.7 g/dL (ref 6.0–8.3)

## 2011-03-30 NOTE — Progress Notes (Signed)
CC:   Rhonda Gosling, MD Rhonda [name], M.D., Ph.D. Rhonda [name], M.D.  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  PAST THERAPY: 1. Patient initially underwent a lumpectomy that revealed a triple     negative stage I breast cancer. 2. She has gone on to receive 5 cycles of adjuvant chemotherapy     consisting of Taxotere carboplatin with day 2 Neulasta.  All of her     therapy was completed on 12/04/2010. 3. The patient has now completed radiation therapy to the left breast     administered by Dr. Dorothy Puffer.  The treatment was given between     12/29/2010 to 02/17/2011.  CURRENT THERAPY:  Observation.  INTERVAL HISTORY:  The patient is seen in followup today.  Overall she seems to be doing well.  She does have significant anxiety and depression.  Her daughter has bipolar disorder and she is currently undergoing intensive therapy.  She otherwise denies any fevers, chills, night sweats.  The patient is now on Abilify and she does feel a little bit more tired and fatigued.  She has some pain in her right leg and left breast off and on.  She again has swelling in her lower extremities which are ongoing.  Remainder of the 10 point review of systems is scanned separately.  CURRENT MEDICATIONS:  Reviewed and they are updated in the electronic medical record.  PERFORMANCE STATUS:  ECOG performance status is 1.  PHYSICAL EXAMINATION:  The patient is awake and alert in no acute distress.  She appears well.  Vital signs:  Temperature 98.5, pulse 87, respirations 20, blood pressure 123/78 and weight is 289.9 pounds. HEENT:  EOMI.  PERRLA.  Sclerae is anicteric.  No conjunctival pallor. Oral mucosa is moist.  Neck:  Supple.  Lungs:  Clear bilaterally to auscultation and percussion.  Cardiovascular:  Regular rate and rhythm. No murmurs.  Abdomen:  Soft, nontender,  nondistended.  Bowel sounds are present.  No HSM.  Extremities:  +1 edema.  Neurologic:  Nonfocal. Breasts:  Left breast reveals radiation-induced changes.  It is slightly bigger than the right breast.  There is no moist desquamation noted at this time.  No palpable masses.  No nipple discharge.  LABORATORY DATA:  WBC 9.7, hemoglobin 10.3, hematocrit 31.6, platelets are 303,000.  IMPRESSION AND PLAN:  A 60 year old female with: 1. Stage I triple negative invasive ductal carcinoma of the left     breast status post lumpectomy with sentinel node biopsy in January     2012.  She is now status post 5 cycles of Taxotere and carboplatin     completed in July 2012. 2. The patient is now completing radiation therapy to the left breast     as of September 2012. 3. The patient wants to keep her Port-A-Cath in for now and we will     schedule her for flushes. 4. The patient will be seen back in 3 months' time for followup.    ______________________________ Drue Second, M.D. KK/MEDQ  D:  03/26/2011  T:  03/30/2011  Job:  [id number]

## 2011-04-08 ENCOUNTER — Telehealth: Payer: Self-pay | Admitting: Radiation Oncology

## 2011-04-08 NOTE — Telephone Encounter (Signed)
100% approved 04/08/11-04/07/12 FM SZ 1 HH INC 13506.00 MOD POV [id number]  Please apply discount to any prior and all current bills.

## 2011-04-09 ENCOUNTER — Ambulatory Visit: Payer: Medicare Other | Admitting: Psychiatry

## 2011-05-07 ENCOUNTER — Ambulatory Visit (INDEPENDENT_AMBULATORY_CARE_PROVIDER_SITE_OTHER): Payer: Medicare Other | Admitting: General Surgery

## 2011-05-07 ENCOUNTER — Encounter (INDEPENDENT_AMBULATORY_CARE_PROVIDER_SITE_OTHER): Payer: Self-pay | Admitting: General Surgery

## 2011-05-07 VITALS — BP 140/84 | HR 88 | Temp 97.4°F | Resp 20 | Ht 62.0 in | Wt 287.0 lb

## 2011-05-07 DIAGNOSIS — Z853 Personal history of malignant neoplasm of breast: Secondary | ICD-10-CM

## 2011-05-07 NOTE — Patient Instructions (Signed)
Breast Self-Exam A self breast exam may help you find changes or problems while they are still small. Do a breast self-exam:  Every month.   One week after your period (menstrual period).   On the first day of each month if you do not have periods anymore.  Look for any:  Change in breast color, size, or shape.   Dimples in your breast.   Changes in your nipples or skin.   Dry skin on your breasts or nipples.   Watery or bloody discharge from your nipples.   Feel for:  Lumps.   Thick, hard places.   Any other changes.  HOME CARE There are 3 ways to do the breast self-exam: In front of a mirror.  Lift your arms over your head and turn side to side.   Put your hands on your hips and lean down, then turn from side to side.   Bend forward and turn from side to side.  In the shower.  With soapy hands, check both breasts. Then check above and below your collarbone and your armpits.   Feel above and below your collarbone down to under your breast, and from the center of your chest to the outer edge of the armpit. Check for any lumps or hard spots.   Using the tips of your middle three fingers check your whole breast by pressing your hand over your breast in a circle or in an up and down motion.  Lying down.  Lie flat on your bed.   Put a small pillow under the breast you are going to check. On that same side, put your hand behind your head.   With your other hand, use the 3 middle fingers to feel the breast.   Move your fingers in a circle around the breast. Press firmly over all parts of the breast to feel for any lumps.  GET HELP RIGHT AWAY IF: You find any changes in your breasts so they can be checked. Document Released: 10/28/2007 Document Revised: 01/21/2011 Document Reviewed: 08/29/2008 Exit[employer] Patient Information 2012 [employer]. 

## 2011-05-07 NOTE — Progress Notes (Signed)
Subjective:     Patient ID: Rhonda [name], female   DOB: Dec 09, 1950, 60 y.o.   MRN: [id number]  HPI This is a 60 year old female who in March of 2012 underwent a left breast wire-guided lumpectomy and axillary sentinel node biopsy for a stage I triple negative left breast cancer. She did well postoperatively. She then completed her chemotherapy with Dr. Park Breed. She is also since completed her radiation therapy with Dr. Mitzi Hansen. She had some significant blistering during radiation for which she's still recovering currently. She comes in today for followup after all of this. She has no real specific complaints referable to her breast. She does state that the left side feels a little bit more swollen than the right.   Review of Systems  Constitutional: Negative for fever, chills and unexpected weight change.  HENT: Negative for hearing loss, congestion, sore throat, trouble swallowing and voice change.   Eyes: Negative for visual disturbance.  Respiratory: Negative for cough and wheezing.   Cardiovascular: Positive for leg swelling. Negative for chest pain and palpitations.  Gastrointestinal: Negative for nausea, vomiting, abdominal pain, diarrhea, constipation, blood in stool, abdominal distention and anal bleeding.  Genitourinary: Negative for hematuria, vaginal bleeding and difficulty urinating.  Musculoskeletal: Negative for arthralgias.  Skin: Negative for rash and wound.  Neurological: Positive for headaches. Negative for seizures and syncope.  Hematological: Negative for adenopathy. Does not bruise/bleed easily.  Psychiatric/Behavioral: Negative for confusion.       Objective:   Physical Exam  Constitutional: She appears well-developed and well-nourished.  Neck: Neck supple.  Pulmonary/Chest: Right breast exhibits no inverted nipple, no mass, no nipple discharge, no skin change and no tenderness. Left breast exhibits skin change (consistent with radiation therapy). Left breast exhibits no  inverted nipple, no mass, no nipple discharge and no tenderness. Breasts are asymmetrical (left breast larger than right).    Lymphadenopathy:    She has no cervical adenopathy.    She has no axillary adenopathy.       Right: No supraclavicular adenopathy present.       Left: No supraclavicular adenopathy present.       Assessment:     History stage I TNBC    Plan:     She has no clinical evidence of recurrence.  She does have some significant side effects from her radiation. She is concerned about her weight and she's working on that she states. We discussed that she should have her mammogram is planned early next year, continue her self exams, and followup frequently. She's been seen by medical oncology looks like every 3-4 months I will plan on seeing her on an annual basis for an exam. I told her to call me sooner she notes anything concerning.

## 2011-05-27 ENCOUNTER — Telehealth: Payer: Self-pay | Admitting: *Deleted

## 2011-05-29 DIAGNOSIS — E1129 Type 2 diabetes mellitus with other diabetic kidney complication: Secondary | ICD-10-CM | POA: Diagnosis not present

## 2011-05-29 DIAGNOSIS — M79609 Pain in unspecified limb: Secondary | ICD-10-CM | POA: Diagnosis not present

## 2011-05-29 DIAGNOSIS — N183 Chronic kidney disease, stage 3 unspecified: Secondary | ICD-10-CM | POA: Diagnosis not present

## 2011-05-29 DIAGNOSIS — G4700 Insomnia, unspecified: Secondary | ICD-10-CM | POA: Diagnosis not present

## 2011-06-03 ENCOUNTER — Ambulatory Visit (HOSPITAL_BASED_OUTPATIENT_CLINIC_OR_DEPARTMENT_OTHER): Payer: 59

## 2011-06-03 VITALS — BP 135/82 | HR 83 | Temp 98.6°F

## 2011-06-03 DIAGNOSIS — C50419 Malignant neoplasm of upper-outer quadrant of unspecified female breast: Secondary | ICD-10-CM | POA: Diagnosis not present

## 2011-06-03 DIAGNOSIS — Z452 Encounter for adjustment and management of vascular access device: Secondary | ICD-10-CM

## 2011-06-03 DIAGNOSIS — C50919 Malignant neoplasm of unspecified site of unspecified female breast: Secondary | ICD-10-CM

## 2011-06-03 MED ORDER — SODIUM CHLORIDE 0.9 % IJ SOLN
10.0000 mL | INTRAMUSCULAR | Status: DC | PRN
  Administered 2011-06-03: 10 mL via INTRAVENOUS
  Filled 2011-06-03: qty 10

## 2011-06-03 MED ORDER — HEPARIN SOD (PORK) LOCK FLUSH 100 UNIT/ML IV SOLN
500.0000 [IU] | Freq: Once | INTRAVENOUS | Status: AC | PRN
  Administered 2011-06-03: 500 [IU] via INTRAVENOUS
  Filled 2011-06-03: qty 5

## 2011-06-10 DIAGNOSIS — Z853 Personal history of malignant neoplasm of breast: Secondary | ICD-10-CM | POA: Diagnosis not present

## 2011-06-15 ENCOUNTER — Encounter: Payer: Self-pay | Admitting: Specialist

## 2011-06-15 NOTE — Progress Notes (Signed)
Spoke with patient over the phone today.  She describes herself as having no energy and needing to figure out a way to get beyond her cancer treatment.  Patient is interested in participating in Finding Your New Normal and plans to begin the next session on February 4.

## 2011-06-18 DIAGNOSIS — IMO0002 Reserved for concepts with insufficient information to code with codable children: Secondary | ICD-10-CM | POA: Diagnosis not present

## 2011-06-18 DIAGNOSIS — M25569 Pain in unspecified knee: Secondary | ICD-10-CM | POA: Diagnosis not present

## 2011-06-18 DIAGNOSIS — M545 Low back pain, unspecified: Secondary | ICD-10-CM | POA: Diagnosis not present

## 2011-06-24 DIAGNOSIS — R609 Edema, unspecified: Secondary | ICD-10-CM | POA: Diagnosis not present

## 2011-06-24 DIAGNOSIS — I10 Essential (primary) hypertension: Secondary | ICD-10-CM | POA: Diagnosis not present

## 2011-06-24 DIAGNOSIS — I5022 Chronic systolic (congestive) heart failure: Secondary | ICD-10-CM | POA: Diagnosis not present

## 2011-07-07 DIAGNOSIS — Z79899 Other long term (current) drug therapy: Secondary | ICD-10-CM | POA: Diagnosis not present

## 2011-07-07 DIAGNOSIS — I10 Essential (primary) hypertension: Secondary | ICD-10-CM | POA: Diagnosis not present

## 2011-07-07 DIAGNOSIS — I5022 Chronic systolic (congestive) heart failure: Secondary | ICD-10-CM | POA: Diagnosis not present

## 2011-07-15 DIAGNOSIS — E1129 Type 2 diabetes mellitus with other diabetic kidney complication: Secondary | ICD-10-CM | POA: Diagnosis not present

## 2011-07-15 DIAGNOSIS — IMO0002 Reserved for concepts with insufficient information to code with codable children: Secondary | ICD-10-CM | POA: Diagnosis not present

## 2011-07-15 DIAGNOSIS — N189 Chronic kidney disease, unspecified: Secondary | ICD-10-CM | POA: Diagnosis not present

## 2011-07-15 DIAGNOSIS — L299 Pruritus, unspecified: Secondary | ICD-10-CM | POA: Diagnosis not present

## 2011-07-16 ENCOUNTER — Ambulatory Visit (HOSPITAL_BASED_OUTPATIENT_CLINIC_OR_DEPARTMENT_OTHER): Payer: 59 | Admitting: Family

## 2011-07-16 ENCOUNTER — Other Ambulatory Visit: Payer: 59 | Admitting: Lab

## 2011-07-16 ENCOUNTER — Telehealth: Payer: Self-pay | Admitting: *Deleted

## 2011-07-16 ENCOUNTER — Encounter: Payer: Self-pay | Admitting: Family

## 2011-07-16 VITALS — BP 100/67 | HR 93 | Temp 98.2°F | Ht 62.0 in | Wt 292.7 lb

## 2011-07-16 DIAGNOSIS — C50419 Malignant neoplasm of upper-outer quadrant of unspecified female breast: Secondary | ICD-10-CM

## 2011-07-16 DIAGNOSIS — Z452 Encounter for adjustment and management of vascular access device: Secondary | ICD-10-CM | POA: Diagnosis not present

## 2011-07-16 DIAGNOSIS — R221 Localized swelling, mass and lump, neck: Secondary | ICD-10-CM

## 2011-07-16 DIAGNOSIS — Z853 Personal history of malignant neoplasm of breast: Secondary | ICD-10-CM

## 2011-07-16 DIAGNOSIS — D0590 Unspecified type of carcinoma in situ of unspecified breast: Secondary | ICD-10-CM | POA: Diagnosis not present

## 2011-07-16 DIAGNOSIS — E559 Vitamin D deficiency, unspecified: Secondary | ICD-10-CM | POA: Diagnosis not present

## 2011-07-16 LAB — CBC WITH DIFFERENTIAL/PLATELET
BASO%: 0.1 % (ref 0.0–2.0)
Basophils Absolute: 0 10*3/uL (ref 0.0–0.1)
EOS%: 4.8 % (ref 0.0–7.0)
Eosinophils Absolute: 0.3 10*3/uL (ref 0.0–0.5)
HCT: 33.2 % — ABNORMAL LOW (ref 34.8–46.6)
HGB: 11.2 g/dL — ABNORMAL LOW (ref 11.6–15.9)
LYMPH%: 14.5 % (ref 14.0–49.7)
MCH: 33.8 pg (ref 25.1–34.0)
MCHC: 33.7 g/dL (ref 31.5–36.0)
MCV: 100.2 fL (ref 79.5–101.0)
MONO#: 0.4 10*3/uL (ref 0.1–0.9)
MONO%: 6.5 % (ref 0.0–14.0)
NEUT#: 4.4 10*3/uL (ref 1.5–6.5)
NEUT%: 74.1 % (ref 38.4–76.8)
Platelets: 344 10*3/uL (ref 145–400)
RBC: 3.32 10*6/uL — ABNORMAL LOW (ref 3.70–5.45)
RDW: 14.4 % (ref 11.2–14.5)
WBC: 5.9 10*3/uL (ref 3.9–10.3)
lymph#: 0.9 10*3/uL (ref 0.9–3.3)

## 2011-07-16 NOTE — Progress Notes (Signed)
[facility]  Name: Rhonda [name]                  DATE: 07/16/2011 MRN: [id number]                      DOB: 02-11-1951  REFERRING PHYSICIAN: Elie Confer, *  DIAGNOSIS: Patient Active Problem List  Diagnoses Date Noted  . Breast cancer, stage 1 03/26/2011  . Unspecified vitamin D deficiency 03/26/2011     Encounter Diagnoses  Name Primary?  . History of breast cancer Yes  . Error     PREVIOUS THERAPY:  1. Left lumpectomy and sentinel node biopsy, triple negative stage I invasive ductal breast cancer. 2. 5 cycles of adjuvant Taxotere/carbo with day 2 Neulasta. Completed 12/04/10. 3. Radiation therapy by Dr. Jannett Celestine. 12/29/10 - 02/17/11.   CURRENT THERAPY: Surveillance  INTERIM HISTORY: Chief complaint today is chronic right knee pain, a total knee replacement has been recommended. Now walks with a cane for stability. As a result, is experiencing chronic depression, treated by primary care. Still has port-a-cath, it bothers her at night, causing pain and discomfort.   Last mammo at [facility] Dec. 13, 2012, normal by her report.   No headache or blurred vision, no cough or shortness of breath, no abdominal pain, no new bone pain (other than right knee).   Practices self breast exam, but requests demonstration, stating "I feel a lot of lumps and don't know what is normal".   PHYSICAL EXAM: BP 100/67  Pulse 93  Temp(Src) 98.2 F (36.8 C) (Oral)  Ht 5\' 2"  (1.575 m)  Wt 292 lb 11.2 oz (132.768 kg)  BMI 53.54 kg/m2 General: Well developed, well nourished, in no acute distress.  EENT: No ocular or oral lesions. No stomatitis. 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  Respiratory: Lungs are clear to auscultation bilaterally with normal respiratory movement and no accessory muscle use. Cardiac: No murmur, rub or tachycardia. No upper or lower extremity edema.  GI: Abdomen is soft, obese, no palpable hepatosplenomegaly. No fluid wave. No  tenderness. Musculoskeletal: No kyphosis, no tenderness over the spine, ribs or hips. Walk with a cane, slow to move.  Lymph: No cervical, infraclavicular, axillary or inguinal adenopathy. Neuro: No focal neurological deficits. Psych: Alert and oriented X 3, appropriate mood and affect.  BREAST EXAM: In the supine position, with the right arm over the head, the right nipple is flattened. Areola is large, breasts are large and glandular, difficult to examine. No periareolar edema or nipple discharge. No mass in any quadrant or subareolar region. No redness of the skin. No right axillary adenopathy. With the left arm over the head, the left nipple is flattened and cleft. No periareolar edema or nipple discharge. No mass in any quadrant or subareolar region. No redness of the skin.Skin is thickened with peau d' orange, with hyperpigmentation from radiation. In the 3 o'clock position, a remote lumpectomy scar. Nodularity underlying the scar. No left axillary adenopathy.     LABORATORY STUDIES:   Results for orders placed in visit on 07/16/11  CBC WITH DIFFERENTIAL      Component Value Range   WBC 5.9  3.9 - 10.3 (10e3/uL)   NEUT# 4.4  1.5 - 6.5 (10e3/uL)   HGB 11.2 (*) 11.6 - 15.9 (g/dL)   HCT 29.5 (*) 28.4 - 46.6 (%)   Platelets 344  145 - 400 (10e3/uL)   MCV 100.2  79.5 - 101.0 (fL)  MCH 33.8  25.1 - 34.0 (pg)   MCHC 33.7  31.5 - 36.0 (g/dL)   RBC 8.11 (*) 9.14 - 5.45 (10e6/uL)   RDW 14.4  11.2 - 14.5 (%)   lymph# 0.9  0.9 - 3.3 (10e3/uL)   MONO# 0.4  0.1 - 0.9 (10e3/uL)   Eosinophils Absolute 0.3  0.0 - 0.5 (10e3/uL)   Basophils Absolute 0.0  0.0 - 0.1 (10e3/uL)   NEUT% 74.1  38.4 - 76.8 (%)   LYMPH% 14.5  14.0 - 49.7 (%)   MONO% 6.5  0.0 - 14.0 (%)   EOS% 4.8  0.0 - 7.0 (%)   BASO% 0.1  0.0 - 2.0 (%)  COMPREHENSIVE METABOLIC PANEL      Component Value Range   Sodium 139  135 - 145 (mEq/L)   Potassium 3.3 (*) 3.5 - 5.3 (mEq/L)   Chloride 94 (*) 96 - 112 (mEq/L)   CO2 32  19 - 32  (mEq/L)   Glucose, Bld 160 (*) 70 - 99 (mg/dL)   BUN 35 (*) 6 - 23 (mg/dL)   Creatinine, Ser 7.82 (*) 0.50 - 1.10 (mg/dL)   Total Bilirubin 0.3  0.3 - 1.2 (mg/dL)   Alkaline Phosphatase 128 (*) 39 - 117 (U/L)   AST 14  0 - 37 (U/L)   ALT 11  0 - 35 (U/L)   Total Protein 6.9  6.0 - 8.3 (g/dL)   Albumin 3.7  3.5 - 5.2 (g/dL)   Calcium 8.9  8.4 - 95.6 (mg/dL)    IMPRESSION:  61 y/o female with:  1. History Stage I left breast cancer with no evidence of recurrence. 2. Painful right knee with recommendation by ortho for total knee replacement.  3. No evidence of recurrence on mammogram Dec. 2012 and today's breast exam.  4. Retained port which causes her discomfort.   PLAN:   1. Following NCCN guidelines, we will see her every 6 months to complete 5 years. Dr. Welton Flakes will see her in August, no labs needed on that visit.   2. Referral to Dr. Dwain Sarna for port removal.

## 2011-07-16 NOTE — Telephone Encounter (Signed)
gave patient appointment for removal of the port cath by dr.wakefield patient on 08-12-2011 at 1:30 gave patient appointment for 01-14-2012 at 11:00 with dr.khan

## 2011-07-17 LAB — COMPREHENSIVE METABOLIC PANEL
ALT: 11 U/L (ref 0–35)
AST: 14 U/L (ref 0–37)
Albumin: 3.7 g/dL (ref 3.5–5.2)
Alkaline Phosphatase: 128 U/L — ABNORMAL HIGH (ref 39–117)
BUN: 35 mg/dL — ABNORMAL HIGH (ref 6–23)
CO2: 32 mEq/L (ref 19–32)
Calcium: 8.9 mg/dL (ref 8.4–10.5)
Chloride: 94 mEq/L — ABNORMAL LOW (ref 96–112)
Creatinine, Ser: 1.31 mg/dL — ABNORMAL HIGH (ref 0.50–1.10)
Glucose, Bld: 160 mg/dL — ABNORMAL HIGH (ref 70–99)
Potassium: 3.3 mEq/L — ABNORMAL LOW (ref 3.5–5.3)
Sodium: 139 mEq/L (ref 135–145)
Total Bilirubin: 0.3 mg/dL (ref 0.3–1.2)
Total Protein: 6.9 g/dL (ref 6.0–8.3)

## 2011-07-17 LAB — VITAMIN D 25 HYDROXY (VIT D DEFICIENCY, FRACTURES): Vit D, 25-Hydroxy: 49 ng/mL (ref 30–89)

## 2011-07-20 ENCOUNTER — Encounter (INDEPENDENT_AMBULATORY_CARE_PROVIDER_SITE_OTHER): Payer: Self-pay

## 2011-07-21 ENCOUNTER — Encounter: Payer: Self-pay | Admitting: Oncology

## 2011-07-28 ENCOUNTER — Encounter (INDEPENDENT_AMBULATORY_CARE_PROVIDER_SITE_OTHER): Payer: 59 | Admitting: General Surgery

## 2011-07-30 ENCOUNTER — Ambulatory Visit (HOSPITAL_BASED_OUTPATIENT_CLINIC_OR_DEPARTMENT_OTHER): Payer: 59

## 2011-07-30 VITALS — BP 136/82 | HR 92 | Temp 97.3°F

## 2011-07-30 DIAGNOSIS — Z469 Encounter for fitting and adjustment of unspecified device: Secondary | ICD-10-CM

## 2011-07-30 DIAGNOSIS — C50919 Malignant neoplasm of unspecified site of unspecified female breast: Secondary | ICD-10-CM

## 2011-07-30 MED ORDER — SODIUM CHLORIDE 0.9 % IJ SOLN
10.0000 mL | INTRAMUSCULAR | Status: DC | PRN
  Administered 2011-07-30: 10 mL via INTRAVENOUS
  Filled 2011-07-30: qty 10

## 2011-07-30 MED ORDER — HEPARIN SOD (PORK) LOCK FLUSH 100 UNIT/ML IV SOLN
500.0000 [IU] | Freq: Once | INTRAVENOUS | Status: AC | PRN
  Administered 2011-07-30: 500 [IU] via INTRAVENOUS
  Filled 2011-07-30: qty 5

## 2011-08-12 ENCOUNTER — Encounter (INDEPENDENT_AMBULATORY_CARE_PROVIDER_SITE_OTHER): Payer: 59 | Admitting: General Surgery

## 2011-08-13 DIAGNOSIS — M5137 Other intervertebral disc degeneration, lumbosacral region: Secondary | ICD-10-CM | POA: Diagnosis not present

## 2011-08-27 ENCOUNTER — Telehealth (INDEPENDENT_AMBULATORY_CARE_PROVIDER_SITE_OTHER): Payer: Self-pay | Admitting: General Surgery

## 2011-08-28 NOTE — Telephone Encounter (Signed)
Patient aware we share a system with the oncologists who had ordered these tests and that is why they were on her check out sheet. She will call with any questions and keep follow up with Dr Dwain Sarna.

## 2011-08-31 ENCOUNTER — Encounter (INDEPENDENT_AMBULATORY_CARE_PROVIDER_SITE_OTHER): Payer: Self-pay | Admitting: General Surgery

## 2011-09-09 DIAGNOSIS — IMO0002 Reserved for concepts with insufficient information to code with codable children: Secondary | ICD-10-CM | POA: Diagnosis not present

## 2011-09-09 DIAGNOSIS — M545 Low back pain, unspecified: Secondary | ICD-10-CM | POA: Diagnosis not present

## 2011-09-09 DIAGNOSIS — M25569 Pain in unspecified knee: Secondary | ICD-10-CM | POA: Diagnosis not present

## 2011-09-10 ENCOUNTER — Encounter (INDEPENDENT_AMBULATORY_CARE_PROVIDER_SITE_OTHER): Payer: Self-pay | Admitting: General Surgery

## 2011-09-10 ENCOUNTER — Ambulatory Visit (INDEPENDENT_AMBULATORY_CARE_PROVIDER_SITE_OTHER): Payer: Medicare Other | Admitting: General Surgery

## 2011-09-10 VITALS — BP 126/76 | HR 72 | Temp 97.3°F | Resp 24 | Ht 63.0 in | Wt 290.2 lb

## 2011-09-10 DIAGNOSIS — Z853 Personal history of malignant neoplasm of breast: Secondary | ICD-10-CM | POA: Diagnosis not present

## 2011-09-10 NOTE — Progress Notes (Signed)
Patient ID: Rhonda [name], female   DOB: 1950-07-07, 61 y.o.   MRN: [id number]  Chief Complaint  Patient presents with  . Follow-up    reck PAC and discuss removal    HPI Rhonda [name] is a 61 y.o. female.   HPI This is a 61 year old female who underwent a left breast lumpectomy and node biopsy. She's completed chemotherapy and radiation therapy at this point. She developed no real complaints except for some worsening knee pain. She tolerated both of these fairly well. She is now coming back to discuss port removal.  Past Medical History  Diagnosis Date  . Unspecified vitamin D deficiency 03/26/2011  . Arthritis   . Asthma   . Diabetes mellitus   . CHF (congestive heart failure)   . Hypertension   . Thyroid disease   . Breast cancer, stage 1 03/26/2011    left    Past Surgical History  Procedure Date  . Breast surgery 07/28/2010    lumpectomy , snbx  . Portacath placement     Family History  Problem Relation Age of Onset  . Birth defects Paternal Uncle     Social History History  Substance Use Topics  . Smoking status: Former Games developer  . Smokeless tobacco: Never Used  . Alcohol Use: No    Allergies  Allergen Reactions  . Ceftin Anaphylaxis    Face and throat swell   . Sulfur     Itching     Current Outpatient Prescriptions  Medication Sig Dispense Refill  . albuterol (PROVENTIL HFA;VENTOLIN HFA) 108 (90 BASE) MCG/ACT inhaler Inhale 2 puffs into the lungs every 6 (six) hours as needed.        Marland Kitchen albuterol (PROVENTIL) (2.5 MG/3ML) 0.083% nebulizer solution Take 2.5 mg by nebulization every 6 (six) hours as needed.        . carvedilol (COREG) 6.25 MG tablet Take 6.25 mg by mouth 2 (two) times daily with a meal.        . clonazePAM (KLONOPIN) 0.5 MG tablet Take 0.5 mg by mouth 4 (four) times daily.        Marland Kitchen esomeprazole (NEXIUM) 40 MG capsule Take 40 mg by mouth daily before breakfast.        . febuxostat (ULORIC) 40 MG tablet Take 80 mg by mouth daily.        .  Fluticasone-Salmeterol (ADVAIR) 250-50 MCG/DOSE AEPB Inhale 1 puff into the lungs every 12 (twelve) hours.        . gabapentin (NEURONTIN) 100 MG capsule Take 100 mg by mouth daily.        Marland Kitchen HYDROcodone-acetaminophen (NORCO) 10-325 MG per tablet Take 1 tablet by mouth every 6 (six) hours as needed.        Marland Kitchen levothyroxine (SYNTHROID, LEVOTHROID) 100 MCG tablet Take 100 mcg by mouth daily.        Marland Kitchen loperamide (IMODIUM) 2 MG capsule Take 2 mg by mouth. 1 tablet up to 8 tablets per 24 hours orally 8 times a day       . metFORMIN (GLUCOPHAGE) 500 MG tablet Take 500 mg by mouth 2 (two) times daily with a meal.        . NON FORMULARY Oxygen        . nystatin (MYCOSTATIN) 100000 UNIT/ML suspension Take 500,000 Units by mouth 4 (four) times daily.        Marland Kitchen PARoxetine (PAXIL-CR) 25 MG 24 hr tablet Take 25 mg by mouth every morning.        Marland Kitchen  potassium chloride SA (K-DUR,KLOR-CON) 20 MEQ tablet Take 20 mEq by mouth daily.        . simvastatin (ZOCOR) 10 MG tablet Take 10 mg by mouth at bedtime.        Marland Kitchen tiotropium (SPIRIVA) 18 MCG inhalation capsule Place 18 mcg into inhaler and inhale daily.        Marland Kitchen topiramate (TOPAMAX) 25 MG tablet Take 25 mg by mouth daily.        Marland Kitchen torsemide (DEMADEX) 20 MG tablet Take 20 mg by mouth 2 (two) times daily.       Marland Kitchen zolpidem (AMBIEN) 10 MG tablet Take 10 mg by mouth at bedtime as needed.        Marland Kitchen DISCONTD: simvastatin (ZOCOR) 20 MG tablet Take 20 mg by mouth at bedtime.          Review of Systems Review of Systems  Blood pressure 126/76, pulse 72, temperature 97.3 F (36.3 C), temperature source Temporal, resp. rate 24, height 5\' 3"  (1.6 m), weight 290 lb 3.2 oz (131.634 kg).  Physical Exam Physical Exam  Vitals reviewed. Constitutional: She appears well-developed and well-nourished.  Cardiovascular: Normal rate, regular rhythm and normal heart sounds.   Pulmonary/Chest: Effort normal and breath sounds normal. She has no wheezes. She has no rales.     Lymphadenopathy:    She has no cervical adenopathy.      Assessment   History breast cancer    Plan    We discussed port removal today with risks and benefits associated with that. When she returns after port removal we will discuss follow up.  We discussed self exams today as well.       Ruben Mahler 09/10/2011, 12:10 PM

## 2011-09-15 ENCOUNTER — Encounter (HOSPITAL_COMMUNITY): Payer: Self-pay | Admitting: Pharmacy Technician

## 2011-09-16 DIAGNOSIS — G609 Hereditary and idiopathic neuropathy, unspecified: Secondary | ICD-10-CM | POA: Diagnosis not present

## 2011-09-16 DIAGNOSIS — F329 Major depressive disorder, single episode, unspecified: Secondary | ICD-10-CM | POA: Diagnosis not present

## 2011-09-16 DIAGNOSIS — E1129 Type 2 diabetes mellitus with other diabetic kidney complication: Secondary | ICD-10-CM | POA: Diagnosis not present

## 2011-09-16 DIAGNOSIS — E039 Hypothyroidism, unspecified: Secondary | ICD-10-CM | POA: Diagnosis not present

## 2011-09-16 DIAGNOSIS — I10 Essential (primary) hypertension: Secondary | ICD-10-CM | POA: Diagnosis not present

## 2011-09-16 DIAGNOSIS — F3289 Other specified depressive episodes: Secondary | ICD-10-CM | POA: Diagnosis not present

## 2011-09-16 DIAGNOSIS — D539 Nutritional anemia, unspecified: Secondary | ICD-10-CM | POA: Diagnosis not present

## 2011-09-16 DIAGNOSIS — D7589 Other specified diseases of blood and blood-forming organs: Secondary | ICD-10-CM | POA: Diagnosis not present

## 2011-09-16 DIAGNOSIS — E782 Mixed hyperlipidemia: Secondary | ICD-10-CM | POA: Diagnosis not present

## 2011-09-16 DIAGNOSIS — N183 Chronic kidney disease, stage 3 unspecified: Secondary | ICD-10-CM | POA: Diagnosis not present

## 2011-09-17 ENCOUNTER — Encounter (HOSPITAL_COMMUNITY): Payer: Self-pay

## 2011-09-17 ENCOUNTER — Encounter (HOSPITAL_COMMUNITY)
Admission: RE | Admit: 2011-09-17 | Discharge: 2011-09-17 | Disposition: A | Discharge status: Home / Self Care | Payer: Medicare Other | Source: Ambulatory Visit | Attending: General Surgery | Admitting: General Surgery

## 2011-09-17 ENCOUNTER — Ambulatory Visit (HOSPITAL_COMMUNITY)
Admission: RE | Admit: 2011-09-17 | Discharge: 2011-09-17 | Disposition: A | Discharge status: Home / Self Care | Payer: Medicare Other | Source: Ambulatory Visit | Attending: General Surgery | Admitting: General Surgery

## 2011-09-17 DIAGNOSIS — Z01811 Encounter for preprocedural respiratory examination: Secondary | ICD-10-CM | POA: Diagnosis not present

## 2011-09-17 DIAGNOSIS — Z01818 Encounter for other preprocedural examination: Secondary | ICD-10-CM | POA: Insufficient documentation

## 2011-09-17 DIAGNOSIS — E119 Type 2 diabetes mellitus without complications: Secondary | ICD-10-CM | POA: Diagnosis not present

## 2011-09-17 DIAGNOSIS — Z01810 Encounter for preprocedural cardiovascular examination: Secondary | ICD-10-CM | POA: Insufficient documentation

## 2011-09-17 DIAGNOSIS — Z01812 Encounter for preprocedural laboratory examination: Secondary | ICD-10-CM | POA: Diagnosis not present

## 2011-09-17 DIAGNOSIS — I509 Heart failure, unspecified: Secondary | ICD-10-CM | POA: Insufficient documentation

## 2011-09-17 DIAGNOSIS — R011 Cardiac murmur, unspecified: Secondary | ICD-10-CM | POA: Diagnosis not present

## 2011-09-17 HISTORY — DX: Anemia, unspecified: D64.9

## 2011-09-17 HISTORY — DX: Major depressive disorder, single episode, unspecified: F32.9

## 2011-09-17 HISTORY — DX: Hypothyroidism, unspecified: E03.9

## 2011-09-17 HISTORY — DX: Adverse effect of unspecified anesthetic, initial encounter: T41.45XA

## 2011-09-17 HISTORY — DX: Dorsalgia, unspecified: M54.9

## 2011-09-17 HISTORY — DX: Other complications of anesthesia, initial encounter: T88.59XA

## 2011-09-17 HISTORY — DX: Gastro-esophageal reflux disease without esophagitis: K21.9

## 2011-09-17 HISTORY — DX: Left bundle-branch block, unspecified: I44.7

## 2011-09-17 HISTORY — DX: Cardiac murmur, unspecified: R01.1

## 2011-09-17 LAB — SURGICAL PCR SCREEN
MRSA, PCR: NEGATIVE
Staphylococcus aureus: NEGATIVE

## 2011-09-17 NOTE — Pre-Procedure Instructions (Signed)
PT'S CBC AND CMET REPORTS FROM [facility] AT [facility] YESTERDAY 09/16/11 OBTAINED AND ON PT'S CHART.  EKG AND CXR WERE DONE TODAY AT [facility]. CARDIOLOGY OFFICE NOTES 07/07/11, ECHO REPORT 12/25/10, HEART CATH REPORT 2010 ON PT'S CHART -FAXED BY [facility].

## 2011-09-17 NOTE — Patient Instructions (Addendum)
YOUR SURGERY IS SCHEDULED ON:  WED  5/1  AT 10:30 AM  REPORT TO [facility] AT: 8:30 AM      PHONE # FOR SHORT STAY IS [phone]  DO NOT EAT OR DRINK ANYTHING AFTER MIDNIGHT THE NIGHT BEFORE YOUR SURGERY.  YOU MAY BRUSH YOUR TEETH, RINSE OUT YOUR MOUTH--BUT NO WATER, NO FOOD, NO CHEWING GUM, NO MINTS, NO CANDIES, NO CHEWING TOBACCO.  PLEASE TAKE THE FOLLOWING MEDICATIONS THE AM OF YOUR SURGERY WITH A FEW SIPS OF WATER: OXYCONTIN, SYNTHROID, PAXIL, COREG, CLONAZEPAM, NEXIUM, GABAPENTIN. USE ADVAIR AND SPIRIVA, USE NEBULIZER IF NEEDED.  BRING ALBUTEROL INHALER TO TAKE TO SURGERY.    IF YOU USE INHALERS--USE YOUR INHALERS THE AM OF YOUR SURGERY AND BRING INHALERS TO THE HOSPITAL -TAKE TO SURGERY.    IF YOU ARE DIABETIC:  DO NOT TAKE ANY DIABETIC MEDICATIONS THE AM OF YOUR SURGERY.  IF YOU TAKE INSULIN IN THE EVENINGS--PLEASE ONLY TAKE 1/2 NORMAL EVENING DOSE THE NIGHT BEFORE YOUR SURGERY.  NO INSULIN THE AM OF YOUR SURGERY.  IF YOU HAVE SLEEP APNEA AND USE CPAP OR BIPAP--PLEASE BRING THE MASK --NOT THE MACHINE-NOT THE TUBING   -JUST THE MASK. DO NOT BRING VALUABLES, MONEY, CREDIT CARDS.  CONTACT LENS, DENTURES / PARTIALS, GLASSES SHOULD NOT BE WORN TO SURGERY AND IN MOST CASES-HEARING AIDS WILL NEED TO BE REMOVED.  BRING YOUR GLASSES CASE, ANY EQUIPMENT NEEDED FOR YOUR CONTACT LENS. FOR PATIENTS ADMITTED TO THE HOSPITAL--CHECK OUT TIME THE DAY OF DISCHARGE IS 11:00 AM.  ALL INPATIENT ROOMS ARE PRIVATE - WITH BATHROOM, TELEPHONE, TELEVISION AND WIFI INTERNET. IF YOU ARE BEING DISCHARGED THE SAME DAY OF YOUR SURGERY--YOU CAN NOT DRIVE YOURSELF HOME--AND SHOULD NOT GO HOME ALONE BY TAXI OR BUS.  NO DRIVING OR OPERATING MACHINERY FOR 24 HOURS FOLLOWING ANESTHESIA / PAIN MEDICATIONS.                            SPECIAL INSTRUCTIONS:  CHLORHEXIDINE SOAP SHOWER (other brand names are Betasept and Hibiclens ) PLEASE SHOWER WITH CHLORHEXIDINE THE NIGHT BEFORE YOUR SURGERY AND THE AM OF  YOUR SURGERY. DO NOT USE CHLORHEXIDINE ON YOUR FACE OR PRIVATE AREAS--YOU MAY USE YOUR NORMAL SOAP THOSE AREAS AND YOUR NORMAL SHAMPOO.  WOMEN SHOULD AVOID SHAVING UNDER ARMS AND SHAVING LEGS 48 HOURS BEFORE USING CHLORHEXIDINE TO AVOID SKIN IRRITATION.  DO NOT USE IF ALLERGIC TO CHLORHEXIDINE.  PLEASE READ OVER ANY  FACT SHEETS THAT YOU WERE GIVEN: MRSA INFORMATION

## 2011-09-18 NOTE — Pre-Procedure Instructions (Signed)
PT'S PREOP EKG SHOWS FIRST DEGREE A-V BLOCK NEW SINCE PREVIOUS TRACING--DR. EWELL NOTIFIEDBY PHONE AND MADE AWARE PT SEES CARDIOLOGIST FOR HX OF CHF, HYPERTENSION AND CHRONIC LEFT BUNDLE BRANCH ON EKG.  DR. Leta Jungling STATES EKG OK FOR SURGERY.

## 2011-09-22 DIAGNOSIS — M545 Low back pain, unspecified: Secondary | ICD-10-CM | POA: Diagnosis not present

## 2011-09-22 DIAGNOSIS — M25569 Pain in unspecified knee: Secondary | ICD-10-CM | POA: Diagnosis not present

## 2011-09-22 DIAGNOSIS — IMO0002 Reserved for concepts with insufficient information to code with codable children: Secondary | ICD-10-CM | POA: Diagnosis not present

## 2011-09-23 ENCOUNTER — Encounter (HOSPITAL_COMMUNITY): Payer: Self-pay | Admitting: Anesthesiology

## 2011-09-23 ENCOUNTER — Ambulatory Visit (HOSPITAL_COMMUNITY): Payer: Medicare Other | Admitting: Anesthesiology

## 2011-09-23 ENCOUNTER — Encounter (HOSPITAL_COMMUNITY)
Admission: RE | Disposition: A | Discharge status: Home / Self Care | Payer: Self-pay | Source: Ambulatory Visit | Attending: General Surgery

## 2011-09-23 ENCOUNTER — Ambulatory Visit (HOSPITAL_COMMUNITY)
Admission: RE | Admit: 2011-09-23 | Discharge: 2011-09-23 | Disposition: A | Discharge status: Home / Self Care | Payer: Medicare Other | Source: Ambulatory Visit | Attending: General Surgery | Admitting: General Surgery

## 2011-09-23 ENCOUNTER — Encounter (HOSPITAL_COMMUNITY): Payer: Self-pay | Admitting: *Deleted

## 2011-09-23 DIAGNOSIS — Z452 Encounter for adjustment and management of vascular access device: Secondary | ICD-10-CM | POA: Insufficient documentation

## 2011-09-23 DIAGNOSIS — E119 Type 2 diabetes mellitus without complications: Secondary | ICD-10-CM | POA: Diagnosis not present

## 2011-09-23 DIAGNOSIS — Z79899 Other long term (current) drug therapy: Secondary | ICD-10-CM | POA: Diagnosis not present

## 2011-09-23 DIAGNOSIS — C50919 Malignant neoplasm of unspecified site of unspecified female breast: Secondary | ICD-10-CM | POA: Insufficient documentation

## 2011-09-23 DIAGNOSIS — J45909 Unspecified asthma, uncomplicated: Secondary | ICD-10-CM | POA: Insufficient documentation

## 2011-09-23 DIAGNOSIS — D649 Anemia, unspecified: Secondary | ICD-10-CM | POA: Diagnosis not present

## 2011-09-23 DIAGNOSIS — I10 Essential (primary) hypertension: Secondary | ICD-10-CM | POA: Diagnosis not present

## 2011-09-23 DIAGNOSIS — R0602 Shortness of breath: Secondary | ICD-10-CM | POA: Diagnosis not present

## 2011-09-23 HISTORY — PX: PORT-A-CATH REMOVAL: SHX5289

## 2011-09-23 LAB — GLUCOSE, CAPILLARY
Glucose-Capillary: 122 mg/dL — ABNORMAL HIGH (ref 70–99)
Glucose-Capillary: 108 mg/dL — ABNORMAL HIGH (ref 70–99)

## 2011-09-23 SURGERY — REMOVAL PORT-A-CATH
Anesthesia: Monitor Anesthesia Care | Wound class: Clean

## 2011-09-23 MED ORDER — FENTANYL CITRATE 0.05 MG/ML IJ SOLN
INTRAMUSCULAR | Status: DC | PRN
  Administered 2011-09-23: 50 ug via INTRAVENOUS

## 2011-09-23 MED ORDER — LACTATED RINGERS IV SOLN: INTRAVENOUS | Status: DC

## 2011-09-23 MED ORDER — FENTANYL CITRATE 0.05 MG/ML IJ SOLN
INTRAMUSCULAR | Status: AC
  Filled 2011-09-23: qty 2

## 2011-09-23 MED ORDER — PROPOFOL 10 MG/ML IV EMUL
INTRAVENOUS | Status: DC | PRN
  Administered 2011-09-23: 50 ug/kg/min via INTRAVENOUS

## 2011-09-23 MED ORDER — HYDROCODONE-ACETAMINOPHEN 10-325 MG PO TABS: 1.0000 | ORAL_TABLET | Freq: Four times a day (QID) | ORAL | Status: DC | PRN

## 2011-09-23 MED ORDER — BUPIVACAINE-EPINEPHRINE PF 0.25-1:200000 % IJ SOLN
INTRAMUSCULAR | Status: AC
  Filled 2011-09-23: qty 30

## 2011-09-23 MED ORDER — ACETAMINOPHEN 650 MG RE SUPP
650.0000 mg | RECTAL | Status: DC | PRN
  Filled 2011-09-23: qty 1

## 2011-09-23 MED ORDER — BUPIVACAINE-EPINEPHRINE 0.25% -1:200000 IJ SOLN
INTRAMUSCULAR | Status: DC | PRN
  Administered 2011-09-23: 30 mL

## 2011-09-23 MED ORDER — ONDANSETRON HCL 4 MG/2ML IJ SOLN: 4.0000 mg | Freq: Four times a day (QID) | INTRAMUSCULAR | Status: DC | PRN

## 2011-09-23 MED ORDER — ACETAMINOPHEN 325 MG PO TABS: 650.0000 mg | ORAL_TABLET | ORAL | Status: DC | PRN

## 2011-09-23 MED ORDER — PROMETHAZINE HCL 25 MG/ML IJ SOLN: 6.2500 mg | INTRAMUSCULAR | Status: DC | PRN

## 2011-09-23 MED ORDER — FENTANYL CITRATE 0.05 MG/ML IJ SOLN
25.0000 ug | INTRAMUSCULAR | Status: DC | PRN
  Administered 2011-09-23 (×2): 50 ug via INTRAVENOUS

## 2011-09-23 MED ORDER — OXYCODONE HCL 5 MG PO TABS
ORAL_TABLET | ORAL | Status: AC
Start: 2011-09-23 — End: 2011-09-23
  Administered 2011-09-23: 5 mg via ORAL
  Filled 2011-09-23: qty 1

## 2011-09-23 MED ORDER — SODIUM CHLORIDE 0.9 % IJ SOLN: 3.0000 mL | Freq: Two times a day (BID) | INTRAMUSCULAR | Status: DC

## 2011-09-23 MED ORDER — OXYCODONE HCL 5 MG PO TABS
5.0000 mg | ORAL_TABLET | ORAL | Status: DC | PRN
  Administered 2011-09-23: 5 mg via ORAL

## 2011-09-23 MED ORDER — MORPHINE SULFATE 10 MG/ML IJ SOLN: 1.0000 mg | INTRAMUSCULAR | Status: DC | PRN

## 2011-09-23 MED ORDER — SODIUM CHLORIDE 0.9 % IJ SOLN: 3.0000 mL | INTRAMUSCULAR | Status: DC | PRN

## 2011-09-23 MED ORDER — SODIUM CHLORIDE 0.9 % IV SOLN: 250.0000 mL | INTRAVENOUS | Status: DC | PRN

## 2011-09-23 MED ORDER — LACTATED RINGERS IV SOLN
INTRAVENOUS | Status: DC
  Administered 2011-09-23: 1000 mL via INTRAVENOUS

## 2011-09-23 SURGICAL SUPPLY — 34 items
BLADE HEX COATED 2.75 (ELECTRODE) ×2 IMPLANT
BLADE SURG 15 STRL LF DISP TIS (BLADE) ×1 IMPLANT
BLADE SURG 15 STRL SS (BLADE) ×1
CANISTER SUCTION 2500CC (MISCELLANEOUS) ×2 IMPLANT
CLOTH BEACON ORANGE TIMEOUT ST (SAFETY) ×2 IMPLANT
DECANTER SPIKE VIAL GLASS SM (MISCELLANEOUS) IMPLANT
DERMABOND ADVANCED (GAUZE/BANDAGES/DRESSINGS) ×1
DERMABOND ADVANCED .7 DNX12 (GAUZE/BANDAGES/DRESSINGS) ×1 IMPLANT
DRAPE LAPAROTOMY TRNSV 102X78 (DRAPE) ×2 IMPLANT
ELECT REM PT RETURN 9FT ADLT (ELECTROSURGICAL) ×2
ELECTRODE REM PT RTRN 9FT ADLT (ELECTROSURGICAL) ×1 IMPLANT
GLOVE BIO SURGEON STRL SZ7 (GLOVE) ×4 IMPLANT
GLOVE BIOGEL PI IND STRL 7.0 (GLOVE) ×1 IMPLANT
GLOVE BIOGEL PI IND STRL 7.5 (GLOVE) ×2 IMPLANT
GLOVE BIOGEL PI INDICATOR 7.0 (GLOVE) ×1
GLOVE BIOGEL PI INDICATOR 7.5 (GLOVE) ×2
GOWN PREVENTION PLUS LG XLONG (DISPOSABLE) ×2 IMPLANT
GOWN PREVENTION PLUS XLARGE (GOWN DISPOSABLE) ×2 IMPLANT
GOWN STRL NON-REIN LRG LVL3 (GOWN DISPOSABLE) ×2 IMPLANT
GOWN STRL REIN XL XLG (GOWN DISPOSABLE) ×2 IMPLANT
KIT BASIN OR (CUSTOM PROCEDURE TRAY) ×2 IMPLANT
NEEDLE HYPO 22GX1.5 SAFETY (NEEDLE) IMPLANT
NEEDLE HYPO 25X1 1.5 SAFETY (NEEDLE) ×2 IMPLANT
PACK BASIC VI WITH GOWN DISP (CUSTOM PROCEDURE TRAY) ×2 IMPLANT
PENCIL BUTTON HOLSTER BLD 10FT (ELECTRODE) ×2 IMPLANT
SPONGE GAUZE 4X4 12PLY (GAUZE/BANDAGES/DRESSINGS) IMPLANT
SPONGE LAP 4X18 X RAY DECT (DISPOSABLE) IMPLANT
SUT MNCRL AB 4-0 PS2 18 (SUTURE) ×2 IMPLANT
SUT VIC AB 3-0 SH 27 (SUTURE) ×2
SUT VIC AB 3-0 SH 27XBRD (SUTURE) ×1 IMPLANT
SYR BULB IRRIGATION 50ML (SYRINGE) IMPLANT
SYR CONTROL 10ML LL (SYRINGE) ×2 IMPLANT
TOWEL OR 17X26 10 PK STRL BLUE (TOWEL DISPOSABLE) ×2 IMPLANT
YANKAUER SUCT BULB TIP 10FT TU (MISCELLANEOUS) IMPLANT

## 2011-09-23 NOTE — Anesthesia Preprocedure Evaluation (Signed)
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 Dental  (+) Caps, Dental Advisory Given and Poor Dentition   Pulmonary shortness of breath and with exertion, asthma ,  breath sounds clear to auscultation  Pulmonary exam normal       Cardiovascular hypertension (Patient denies hypertension; indicates history of hypotension), +CHF + dysrhythmias + Valvular Problems/Murmurs Rhythm:Regular Rate:Normal     Neuro/Psych  Headaches, PSYCHIATRIC DISORDERS Depression    GI/Hepatic Neg liver ROS, GERD-  Medicated,  Endo/Other  negative endocrine ROSDiabetes mellitus-, Well Controlled, Type 2, Oral Hypoglycemic AgentsHypothyroidism Morbid obesity  Renal/GU negative Renal ROS  negative genitourinary   Musculoskeletal negative musculoskeletal ROS (+)   Abdominal (+) + obese,   Peds negative pediatric ROS (+)  Hematology negative hematology ROS (+)   Anesthesia Other Findings   Reproductive/Obstetrics negative OB ROS                           Anesthesia Physical Anesthesia Plan  ASA: III  Anesthesia Plan: MAC   Post-op Pain Management:    Induction:   Airway Management Planned: Nasal Cannula  Additional Equipment:   Intra-op Plan:   Post-operative Plan:   Informed Consent: I have reviewed the patients History and Physical, chart, labs and discussed the procedure including the risks, benefits and alternatives for the proposed anesthesia with the patient or authorized representative who has indicated his/her understanding and acceptance.    Dental advisory given  Plan Discussed with: CRNA  Anesthesia Plan Comments:         Anesthesia Quick Evaluation

## 2011-09-23 NOTE — H&P (View-Only) (Signed)
Patient ID: Rhonda [name], female   DOB: 08/12/1950, 60 y.o.   MRN: [id number]  Chief Complaint  Patient presents with  . Follow-up    reck PAC and discuss removal    HPI Rhonda [name] is a 60 y.o. female.   HPI This is a 60-year-old female who underwent a left breast lumpectomy and node biopsy. She's completed chemotherapy and radiation therapy at this point. She developed no real complaints except for some worsening knee pain. She tolerated both of these fairly well. She is now coming back to discuss port removal.  Past Medical History  Diagnosis Date  . Unspecified vitamin D deficiency 03/26/2011  . Arthritis   . Asthma   . Diabetes mellitus   . CHF (congestive heart failure)   . Hypertension   . Thyroid disease   . Breast cancer, stage 1 03/26/2011    left    Past Surgical History  Procedure Date  . Breast surgery 07/28/2010    lumpectomy , snbx  . Portacath placement     Family History  Problem Relation Age of Onset  . Birth defects Paternal Uncle     Social History History  Substance Use Topics  . Smoking status: Former Smoker  . Smokeless tobacco: Never Used  . Alcohol Use: No    Allergies  Allergen Reactions  . Ceftin Anaphylaxis    Face and throat swell   . Sulfur     Itching     Current Outpatient Prescriptions  Medication Sig Dispense Refill  . albuterol (PROVENTIL HFA;VENTOLIN HFA) 108 (90 BASE) MCG/ACT inhaler Inhale 2 puffs into the lungs every 6 (six) hours as needed.        . albuterol (PROVENTIL) (2.5 MG/3ML) 0.083% nebulizer solution Take 2.5 mg by nebulization every 6 (six) hours as needed.        . carvedilol (COREG) 6.25 MG tablet Take 6.25 mg by mouth 2 (two) times daily with a meal.        . clonazePAM (KLONOPIN) 0.5 MG tablet Take 0.5 mg by mouth 4 (four) times daily.        . esomeprazole (NEXIUM) 40 MG capsule Take 40 mg by mouth daily before breakfast.        . febuxostat (ULORIC) 40 MG tablet Take 80 mg by mouth daily.        .  Fluticasone-Salmeterol (ADVAIR) 250-50 MCG/DOSE AEPB Inhale 1 puff into the lungs every 12 (twelve) hours.        . gabapentin (NEURONTIN) 100 MG capsule Take 100 mg by mouth daily.        . HYDROcodone-acetaminophen (NORCO) 10-325 MG per tablet Take 1 tablet by mouth every 6 (six) hours as needed.        . levothyroxine (SYNTHROID, LEVOTHROID) 100 MCG tablet Take 100 mcg by mouth daily.        . loperamide (IMODIUM) 2 MG capsule Take 2 mg by mouth. 1 tablet up to 8 tablets per 24 hours orally 8 times a day       . metFORMIN (GLUCOPHAGE) 500 MG tablet Take 500 mg by mouth 2 (two) times daily with a meal.        . NON FORMULARY Oxygen        . nystatin (MYCOSTATIN) 100000 UNIT/ML suspension Take 500,000 Units by mouth 4 (four) times daily.        . PARoxetine (PAXIL-CR) 25 MG 24 hr tablet Take 25 mg by mouth every morning.        .   potassium chloride SA (K-DUR,KLOR-CON) 20 MEQ tablet Take 20 mEq by mouth daily.        . simvastatin (ZOCOR) 10 MG tablet Take 10 mg by mouth at bedtime.        . tiotropium (SPIRIVA) 18 MCG inhalation capsule Place 18 mcg into inhaler and inhale daily.        . topiramate (TOPAMAX) 25 MG tablet Take 25 mg by mouth daily.        . torsemide (DEMADEX) 20 MG tablet Take 20 mg by mouth 2 (two) times daily.       . zolpidem (AMBIEN) 10 MG tablet Take 10 mg by mouth at bedtime as needed.        . DISCONTD: simvastatin (ZOCOR) 20 MG tablet Take 20 mg by mouth at bedtime.          Review of Systems Review of Systems  Blood pressure 126/76, pulse 72, temperature 97.3 F (36.3 C), temperature source Temporal, resp. rate 24, height 5' 3" (1.6 m), weight 290 lb 3.2 oz (131.634 kg).  Physical Exam Physical Exam  Vitals reviewed. Constitutional: She appears well-developed and well-nourished.  Cardiovascular: Normal rate, regular rhythm and normal heart sounds.   Pulmonary/Chest: Effort normal and breath sounds normal. She has no wheezes. She has no rales.     Lymphadenopathy:    She has no cervical adenopathy.      Assessment   History breast cancer    Plan    We discussed port removal today with risks and benefits associated with that. When she returns after port removal we will discuss follow up.  We discussed self exams today as well.       Tomeeka Plaugher 09/10/2011, 12:10 PM    

## 2011-09-23 NOTE — Discharge Instructions (Signed)
[facility] Office Phone Number [phone]   POST OP INSTRUCTIONS  Always review your discharge instruction sheet given to you by the facility where your surgery was performed.  IF YOU HAVE DISABILITY OR FAMILY LEAVE FORMS, YOU MUST BRING THEM TO THE OFFICE FOR PROCESSING.  DO NOT GIVE THEM TO YOUR DOCTOR.  1. A prescription for pain medication may be given to you upon discharge.  Take your pain medication as prescribed, if needed.  If narcotic pain medicine is not needed, then you may take acetaminophen (Tylenol), naprosyn (Alleve) or ibuprofen (Advil) as needed. 2. Take your usually prescribed medications unless otherwise directed 3. If you need a refill on your pain medication, please contact your pharmacy.  They will contact our office to request authorization.  Prescriptions will not be filled after 5pm or on week-ends. 4. You should eat very light the first 24 hours after surgery, such as soup, crackers, pudding, etc.  Resume your normal diet the day after surgery. 5. Most patients will experience some swelling and bruising in the breast.  Ice packs will help. Swelling and bruising can take several days to resolve.  6. It is common to experience some constipation if taking pain medication after surgery.  Increasing fluid intake and taking a stool softener will usually help or prevent this problem from occurring.  A mild laxative (Milk of Magnesia or Miralax) should be taken according to package directions if there are no bowel movements after 48 hours. 7. Unless discharge instructions indicate otherwise, you may remove your bandages 48 hours after surgery and you may shower at that time.  You may have steri-strips (small skin tapes) in place directly over the incision.  These strips should be left on the skin for 7-10 days and will come off on their own.  If your surgeon used skin glue on the incision, you may shower in 24 hours.  The glue will flake off over the next 2-3  weeks.  Any sutures or staples will be removed at the office during your follow-up visit. 8. ACTIVITIES:  You may resume regular daily activities (gradually increasing) beginning the next day.  Wearing a good support bra or sports bra minimizes pain and swelling.  You may have sexual intercourse when it is comfortable. a. You may drive when you no longer are taking prescription pain medication, you can comfortably wear a seatbelt, and you can safely maneuver your car and apply brakes. b. RETURN TO WORK:  ______________________________________________________________________________________ 9. You should see your doctor in the office for a follow-up appointment approximately two weeks after your surgery.  Your doctor's nurse will typically make your follow-up appointment when she calls you with your pathology report.  Expect your pathology report 3-4 business days after your surgery.  You may call to check if you do not hear from [place] after three days. 10. OTHER INSTRUCTIONS: _______________________________________________________________________________________________ _____________________________________________________________________________________________________________________________________ _____________________________________________________________________________________________________________________________________ _____________________________________________________________________________________________________________________________________  WHEN TO CALL DR Mikaili Flippin: 1. Fever over 101.0 2. Nausea and/or vomiting. 3. Extreme swelling or bruising. 4. Continued bleeding from incision. 5. Increased pain, redness, or drainage from the incision.  The clinic staff is available to answer your questions during regular business hours.  Please don't hesitate to call and ask to speak to one of the nurses for clinical concerns.  If you have a medical emergency, go to the nearest emergency  room or call 911.  A surgeon from [facility] is always on call at the hospital.  For further questions, please visit [email or web address]

## 2011-09-23 NOTE — Interval H&P Note (Signed)
History and Physical Interval Note:  09/23/2011 10:25 AM  Rhonda [name]  has presented today for surgery, with the diagnosis of remove port  The various methods of treatment have been discussed with the patient and family. After consideration of risks, benefits and other options for treatment, the patient has consented to  Procedure(s) (LRB): REMOVAL PORT-A-CATH (N/A) as a surgical intervention .  The patients' history has been reviewed, patient examined, no change in status, stable for surgery.  I have reviewed the patients' chart and labs.  Questions were answered to the patient's satisfaction.     Rhonda [name]

## 2011-09-23 NOTE — Op Note (Signed)
Preoperative diagnosis: Breast cancer status post therapy no longer need for venous access Postoperative diagnosis: Same as above Procedure: Right subclavian port removal Surgeon: Dr. Harden Mo Anesthesia: Local with IV sedation Specimens: None Estimated blood loss: Minimal Complications: None Drains: None Disposition to recovery in stable condition Sponge and needle count correct x2 and of operation  Indications: This is a 61 year old female I know well from her treatment for breast cancer. She's completed her therapy and has no longer any need for venous access and wishes to have her port removed. We discussed the risks and benefits of this prior to beginning.  Procedure: After informed consent was obtained the patient was taken to the operating room. She had sequential compression devices placed. She was placed under monitored anesthesia care. Her right chest was then prepped and draped in the standard sterile surgical fashion. A surgical timeout was performed.  I did a field block and infiltrated quarter percent Marcaine all around the right chest as well as in her old scar. I then reentered her incision. Her port as well as the stitch material was removed in its entirety. Hemostasis was observed. I closed this with 3-0 Vicryl, 4-0 Monocryl and Dermabond. She tolerated this well was transferred to recovery in stable condition.

## 2011-09-23 NOTE — Transfer of Care (Signed)
Immediate Anesthesia Transfer of Care Note  Patient: Rhonda [name]  Procedure(s) Performed: Procedure(s) (LRB): REMOVAL PORT-A-CATH (N/A)  Patient Location: PACU  Anesthesia Type: MAC  Level of Consciousness: awake  Airway & Oxygen Therapy: Patient Spontanous Breathing and Patient connected to face mask oxygen  Post-op Assessment: Report given to PACU RN and Post -op Vital signs reviewed and stable  Post vital signs: Reviewed and stable  Complications: No apparent anesthesia complications

## 2011-09-23 NOTE — Anesthesia Postprocedure Evaluation (Signed)
Anesthesia Post Note  Patient: Rhonda [name]  Procedure(s) Performed: Procedure(s) (LRB): REMOVAL PORT-A-CATH (N/A)  Anesthesia type: MAC  Patient location: PACU  Post pain: Pain level controlled  Post assessment: Post-op Vital signs reviewed  Last Vitals:  Filed Vitals:   09/23/11 1145  BP:   Pulse:   Temp:   Resp: 15    Post vital signs: Reviewed  Level of consciousness: sedated  Complications: No apparent anesthesia complications

## 2011-09-24 ENCOUNTER — Encounter (HOSPITAL_COMMUNITY): Payer: Self-pay | Admitting: General Surgery

## 2011-09-29 DIAGNOSIS — M25569 Pain in unspecified knee: Secondary | ICD-10-CM | POA: Diagnosis not present

## 2011-09-29 DIAGNOSIS — M545 Low back pain, unspecified: Secondary | ICD-10-CM | POA: Diagnosis not present

## 2011-09-29 DIAGNOSIS — IMO0002 Reserved for concepts with insufficient information to code with codable children: Secondary | ICD-10-CM | POA: Diagnosis not present

## 2011-09-30 DIAGNOSIS — E669 Obesity, unspecified: Secondary | ICD-10-CM | POA: Diagnosis not present

## 2011-09-30 DIAGNOSIS — I447 Left bundle-branch block, unspecified: Secondary | ICD-10-CM | POA: Diagnosis not present

## 2011-09-30 DIAGNOSIS — I5022 Chronic systolic (congestive) heart failure: Secondary | ICD-10-CM | POA: Diagnosis not present

## 2011-09-30 DIAGNOSIS — I428 Other cardiomyopathies: Secondary | ICD-10-CM | POA: Diagnosis not present

## 2011-09-30 DIAGNOSIS — I10 Essential (primary) hypertension: Secondary | ICD-10-CM | POA: Diagnosis not present

## 2011-10-02 DIAGNOSIS — IMO0002 Reserved for concepts with insufficient information to code with codable children: Secondary | ICD-10-CM | POA: Diagnosis not present

## 2011-10-02 DIAGNOSIS — M1710 Unilateral primary osteoarthritis, unspecified knee: Secondary | ICD-10-CM | POA: Diagnosis not present

## 2011-10-02 DIAGNOSIS — M25569 Pain in unspecified knee: Secondary | ICD-10-CM | POA: Diagnosis not present

## 2011-10-07 DIAGNOSIS — I5022 Chronic systolic (congestive) heart failure: Secondary | ICD-10-CM | POA: Diagnosis not present

## 2011-10-08 DIAGNOSIS — G43009 Migraine without aura, not intractable, without status migrainosus: Secondary | ICD-10-CM | POA: Diagnosis not present

## 2011-10-08 DIAGNOSIS — R209 Unspecified disturbances of skin sensation: Secondary | ICD-10-CM | POA: Diagnosis not present

## 2011-10-08 DIAGNOSIS — R42 Dizziness and giddiness: Secondary | ICD-10-CM | POA: Diagnosis not present

## 2011-10-08 DIAGNOSIS — I509 Heart failure, unspecified: Secondary | ICD-10-CM | POA: Diagnosis not present

## 2011-10-13 DIAGNOSIS — IMO0002 Reserved for concepts with insufficient information to code with codable children: Secondary | ICD-10-CM | POA: Diagnosis not present

## 2011-10-13 DIAGNOSIS — M545 Low back pain, unspecified: Secondary | ICD-10-CM | POA: Diagnosis not present

## 2011-10-13 DIAGNOSIS — M25569 Pain in unspecified knee: Secondary | ICD-10-CM | POA: Diagnosis not present

## 2011-10-14 DIAGNOSIS — M545 Low back pain, unspecified: Secondary | ICD-10-CM | POA: Diagnosis not present

## 2011-10-14 DIAGNOSIS — M25569 Pain in unspecified knee: Secondary | ICD-10-CM | POA: Diagnosis not present

## 2011-10-14 DIAGNOSIS — IMO0002 Reserved for concepts with insufficient information to code with codable children: Secondary | ICD-10-CM | POA: Diagnosis not present

## 2011-10-16 DIAGNOSIS — M545 Low back pain, unspecified: Secondary | ICD-10-CM | POA: Diagnosis not present

## 2011-10-16 DIAGNOSIS — M25569 Pain in unspecified knee: Secondary | ICD-10-CM | POA: Diagnosis not present

## 2011-10-16 DIAGNOSIS — IMO0002 Reserved for concepts with insufficient information to code with codable children: Secondary | ICD-10-CM | POA: Diagnosis not present

## 2011-10-20 DIAGNOSIS — M545 Low back pain, unspecified: Secondary | ICD-10-CM | POA: Diagnosis not present

## 2011-10-20 DIAGNOSIS — IMO0002 Reserved for concepts with insufficient information to code with codable children: Secondary | ICD-10-CM | POA: Diagnosis not present

## 2011-10-20 DIAGNOSIS — M25569 Pain in unspecified knee: Secondary | ICD-10-CM | POA: Diagnosis not present

## 2011-10-21 DIAGNOSIS — I10 Essential (primary) hypertension: Secondary | ICD-10-CM | POA: Diagnosis not present

## 2011-10-21 DIAGNOSIS — E1129 Type 2 diabetes mellitus with other diabetic kidney complication: Secondary | ICD-10-CM | POA: Diagnosis not present

## 2011-10-21 DIAGNOSIS — F3289 Other specified depressive episodes: Secondary | ICD-10-CM | POA: Diagnosis not present

## 2011-10-21 DIAGNOSIS — G609 Hereditary and idiopathic neuropathy, unspecified: Secondary | ICD-10-CM | POA: Diagnosis not present

## 2011-10-21 DIAGNOSIS — E039 Hypothyroidism, unspecified: Secondary | ICD-10-CM | POA: Diagnosis not present

## 2011-10-21 DIAGNOSIS — N189 Chronic kidney disease, unspecified: Secondary | ICD-10-CM | POA: Diagnosis not present

## 2011-10-21 DIAGNOSIS — M109 Gout, unspecified: Secondary | ICD-10-CM | POA: Diagnosis not present

## 2011-10-21 DIAGNOSIS — F329 Major depressive disorder, single episode, unspecified: Secondary | ICD-10-CM | POA: Diagnosis not present

## 2011-10-23 DIAGNOSIS — M25569 Pain in unspecified knee: Secondary | ICD-10-CM | POA: Diagnosis not present

## 2011-10-23 DIAGNOSIS — M545 Low back pain, unspecified: Secondary | ICD-10-CM | POA: Diagnosis not present

## 2011-10-23 DIAGNOSIS — IMO0002 Reserved for concepts with insufficient information to code with codable children: Secondary | ICD-10-CM | POA: Diagnosis not present

## 2011-10-27 DIAGNOSIS — M545 Low back pain, unspecified: Secondary | ICD-10-CM | POA: Diagnosis not present

## 2011-10-27 DIAGNOSIS — M25569 Pain in unspecified knee: Secondary | ICD-10-CM | POA: Diagnosis not present

## 2011-10-27 DIAGNOSIS — IMO0002 Reserved for concepts with insufficient information to code with codable children: Secondary | ICD-10-CM | POA: Diagnosis not present

## 2011-10-28 ENCOUNTER — Ambulatory Visit: Payer: 59 | Admitting: Psychiatry

## 2011-11-05 DIAGNOSIS — M25569 Pain in unspecified knee: Secondary | ICD-10-CM | POA: Diagnosis not present

## 2011-11-12 DIAGNOSIS — F0630 Mood disorder due to known physiological condition, unspecified: Secondary | ICD-10-CM | POA: Diagnosis not present

## 2011-11-17 DIAGNOSIS — M25569 Pain in unspecified knee: Secondary | ICD-10-CM | POA: Diagnosis not present

## 2011-11-17 DIAGNOSIS — M545 Low back pain, unspecified: Secondary | ICD-10-CM | POA: Diagnosis not present

## 2011-11-17 DIAGNOSIS — IMO0002 Reserved for concepts with insufficient information to code with codable children: Secondary | ICD-10-CM | POA: Diagnosis not present

## 2011-11-18 DIAGNOSIS — F411 Generalized anxiety disorder: Secondary | ICD-10-CM | POA: Diagnosis not present

## 2011-11-18 DIAGNOSIS — N189 Chronic kidney disease, unspecified: Secondary | ICD-10-CM | POA: Diagnosis not present

## 2011-11-18 DIAGNOSIS — E039 Hypothyroidism, unspecified: Secondary | ICD-10-CM | POA: Diagnosis not present

## 2011-11-18 DIAGNOSIS — E1129 Type 2 diabetes mellitus with other diabetic kidney complication: Secondary | ICD-10-CM | POA: Diagnosis not present

## 2011-11-18 DIAGNOSIS — F329 Major depressive disorder, single episode, unspecified: Secondary | ICD-10-CM | POA: Diagnosis not present

## 2011-11-18 DIAGNOSIS — R5381 Other malaise: Secondary | ICD-10-CM | POA: Diagnosis not present

## 2011-11-18 DIAGNOSIS — F3289 Other specified depressive episodes: Secondary | ICD-10-CM | POA: Diagnosis not present

## 2011-11-18 DIAGNOSIS — R5383 Other fatigue: Secondary | ICD-10-CM | POA: Diagnosis not present

## 2011-11-20 DIAGNOSIS — M25569 Pain in unspecified knee: Secondary | ICD-10-CM | POA: Diagnosis not present

## 2011-11-20 DIAGNOSIS — M545 Low back pain, unspecified: Secondary | ICD-10-CM | POA: Diagnosis not present

## 2011-11-20 DIAGNOSIS — IMO0002 Reserved for concepts with insufficient information to code with codable children: Secondary | ICD-10-CM | POA: Diagnosis not present

## 2011-11-24 DIAGNOSIS — M25569 Pain in unspecified knee: Secondary | ICD-10-CM | POA: Diagnosis not present

## 2011-11-24 DIAGNOSIS — IMO0002 Reserved for concepts with insufficient information to code with codable children: Secondary | ICD-10-CM | POA: Diagnosis not present

## 2011-11-24 DIAGNOSIS — M545 Low back pain, unspecified: Secondary | ICD-10-CM | POA: Diagnosis not present

## 2011-12-01 DIAGNOSIS — IMO0002 Reserved for concepts with insufficient information to code with codable children: Secondary | ICD-10-CM | POA: Diagnosis not present

## 2011-12-01 DIAGNOSIS — M545 Low back pain, unspecified: Secondary | ICD-10-CM | POA: Diagnosis not present

## 2011-12-01 DIAGNOSIS — M25569 Pain in unspecified knee: Secondary | ICD-10-CM | POA: Diagnosis not present

## 2011-12-02 ENCOUNTER — Ambulatory Visit (INDEPENDENT_AMBULATORY_CARE_PROVIDER_SITE_OTHER): Payer: Medicare Other | Admitting: Psychiatry

## 2011-12-02 DIAGNOSIS — F0630 Mood disorder due to known physiological condition, unspecified: Secondary | ICD-10-CM

## 2011-12-02 DIAGNOSIS — Z638 Other specified problems related to primary support group: Secondary | ICD-10-CM | POA: Diagnosis not present

## 2011-12-02 NOTE — Progress Notes (Signed)
12-02-2011  Patient and husband in conjoint psychotherapy session.  Recommended patient get books on tape, learn to crochet and do R & R activities with husband to reduce anxiety.  Next appointment 12-30-2011.

## 2011-12-04 DIAGNOSIS — M545 Low back pain, unspecified: Secondary | ICD-10-CM | POA: Diagnosis not present

## 2011-12-04 DIAGNOSIS — IMO0002 Reserved for concepts with insufficient information to code with codable children: Secondary | ICD-10-CM | POA: Diagnosis not present

## 2011-12-04 DIAGNOSIS — M25569 Pain in unspecified knee: Secondary | ICD-10-CM | POA: Diagnosis not present

## 2011-12-10 ENCOUNTER — Other Ambulatory Visit: Payer: Self-pay | Admitting: Gynecology

## 2011-12-10 DIAGNOSIS — N840 Polyp of corpus uteri: Secondary | ICD-10-CM | POA: Diagnosis not present

## 2011-12-10 DIAGNOSIS — N950 Postmenopausal bleeding: Secondary | ICD-10-CM | POA: Diagnosis not present

## 2011-12-15 DIAGNOSIS — M25569 Pain in unspecified knee: Secondary | ICD-10-CM | POA: Diagnosis not present

## 2011-12-15 DIAGNOSIS — M545 Low back pain, unspecified: Secondary | ICD-10-CM | POA: Diagnosis not present

## 2011-12-15 DIAGNOSIS — IMO0002 Reserved for concepts with insufficient information to code with codable children: Secondary | ICD-10-CM | POA: Diagnosis not present

## 2011-12-16 DIAGNOSIS — Z13820 Encounter for screening for osteoporosis: Secondary | ICD-10-CM | POA: Diagnosis not present

## 2011-12-16 DIAGNOSIS — N950 Postmenopausal bleeding: Secondary | ICD-10-CM | POA: Diagnosis not present

## 2011-12-16 DIAGNOSIS — N840 Polyp of corpus uteri: Secondary | ICD-10-CM | POA: Diagnosis not present

## 2011-12-16 DIAGNOSIS — N921 Excessive and frequent menstruation with irregular cycle: Secondary | ICD-10-CM | POA: Diagnosis not present

## 2011-12-23 DIAGNOSIS — N939 Abnormal uterine and vaginal bleeding, unspecified: Secondary | ICD-10-CM | POA: Diagnosis not present

## 2011-12-23 DIAGNOSIS — N390 Urinary tract infection, site not specified: Secondary | ICD-10-CM | POA: Diagnosis not present

## 2011-12-23 DIAGNOSIS — N926 Irregular menstruation, unspecified: Secondary | ICD-10-CM | POA: Diagnosis not present

## 2011-12-23 DIAGNOSIS — N840 Polyp of corpus uteri: Secondary | ICD-10-CM | POA: Diagnosis not present

## 2011-12-30 ENCOUNTER — Ambulatory Visit (INDEPENDENT_AMBULATORY_CARE_PROVIDER_SITE_OTHER): Payer: Medicare Other | Admitting: Psychiatry

## 2011-12-30 DIAGNOSIS — Z638 Other specified problems related to primary support group: Secondary | ICD-10-CM

## 2011-12-30 DIAGNOSIS — F0630 Mood disorder due to known physiological condition, unspecified: Secondary | ICD-10-CM

## 2011-12-30 NOTE — Progress Notes (Signed)
12-30-2011  Patient and husband in conjoint psychotherapy.  Next appointment 01-27-2012.

## 2012-01-01 NOTE — H&P (Signed)
RUNELL KOVICH  DICTATION # [id number] CSN# [id number]   Meriel Pica, MD 01/01/2012 3:50 PM

## 2012-01-04 NOTE — H&P (Signed)
[name], [name] NO.:  [id number]  MEDICAL RECORD NO.:  [id number]  LOCATION:                                 FACILITY:  PHYSICIAN:  Duke Salvia. Marcelle Overlie, M.D.DATE OF BIRTH:  [birth year]  DATE OF ADMISSION: DATE OF DISCHARGE:                             HISTORY & PHYSICAL   A 61 year old postmenopausal patient of Dr. Chevis [name].  Over the past number of years, she has had multiple endometrial biopsies and D and Cs for abnormal bleeding.  Most recently, endometrial biopsy was normal per Dr. Chevis [name], but sonohysterogram demonstrated a well-defined polyp.  I saw her recently to discuss hysteroscopy, D and C with removal of the polyp, specifically risks related to bleeding, infection, and other complications such as perforation that may required additional surgery, all reviewed, which she understands and accepts.  PAST MEDICAL HISTORY:  CURRENT MEDICATIONS: 1. Tylenol No. 3 p.r.n. 2. Hydrocodone 300/30 p.r.n. 3. Clonazepam 0.5. 4. Wellbutrin XL 150 p.o. b.i.d. 5. Ambien 5 mg p.o. at bedtime p.r.n. 6. Lidocaine ointment 5% p.r.n. 7. Gabapentin 300 mg p.o. b.i.d. 8. Synthroid 125 mcg daily. 9. Klor-Con daily. 10.Voltaren gel p.r.n. 11.Furosemide 20 mg p.o. b.i.d. 12.Abilify 2 mg daily. 13.Advair Diskus p.r.n. 14.ProAir p.r.n. 15.Simvastatin 10 mg daily. 16.Uloric, Paxil 25 mg daily. 17.Metformin 500 mg daily. 18.Carvedilol 6.25 mg daily.  Her PCP is Dr. Gerri [name].  Cardiology is Dr. Mayford [name] at [place]. Orthopedic is Dr. Ethelene [name] and Rhonda [name], psychiatrist Dr. Donell [name], and her therapist Dr. Boone [name].  SURGICAL HISTORY:  She has had cleft lip and palate repair, two vaginal deliveries, left breast lumpectomy with chemotherapy and radiation.  FAMILY HISTORY:  Significant for father with leukemia and MI, mother with breast cancer.  PHYSICAL EXAMINATION:  VITAL SIGNS:  Temp 98.2, blood pressure 130/78. HEENT:  Unremarkable. NECK:  Supple without masses. LUNGS:   Clear. CARDIOVASCULAR:  Regular rate and rhythm without murmurs, rubs, or gallops. BREASTS:  Without masses. ABDOMEN:  Soft, flat, and nontender. PELVIC:  Vulva, vagina and cervix normal.  Uterus normal size.  Adnexa negative. EXTREMITIES:  Unremarkable. NEUROLOGIC:  Unremarkable.  IMPRESSION:  Postmenopausal bleeding, endometrial polyp, recent normal endometrial biopsy per Dr. Chevis [name].  PLAN:  D and C hysteroscopy with two clear resections of endometrial polyp.  Procedure and risks were discussed as above.     Rhonda [name] M. Marcelle Overlie, M.D.     RM[facility]/MEDQ  D:  01/01/2012  T:  01/02/2012  Job:  [id number]

## 2012-01-06 DIAGNOSIS — F0630 Mood disorder due to known physiological condition, unspecified: Secondary | ICD-10-CM | POA: Diagnosis not present

## 2012-01-09 ENCOUNTER — Encounter (HOSPITAL_COMMUNITY): Payer: Self-pay | Admitting: Pharmacist

## 2012-01-13 ENCOUNTER — Encounter (HOSPITAL_COMMUNITY): Payer: Self-pay

## 2012-01-13 ENCOUNTER — Encounter (HOSPITAL_COMMUNITY)
Admission: RE | Admit: 2012-01-13 | Discharge: 2012-01-13 | Disposition: A | Discharge status: Home / Self Care | Payer: Medicare Other | Source: Ambulatory Visit | Attending: Obstetrics and Gynecology | Admitting: Obstetrics and Gynecology

## 2012-01-13 DIAGNOSIS — N840 Polyp of corpus uteri: Secondary | ICD-10-CM | POA: Diagnosis not present

## 2012-01-13 DIAGNOSIS — N950 Postmenopausal bleeding: Secondary | ICD-10-CM | POA: Diagnosis not present

## 2012-01-13 HISTORY — DX: Hyperlipidemia, unspecified: E78.5

## 2012-01-13 HISTORY — DX: Anxiety disorder, unspecified: F41.9

## 2012-01-13 LAB — BASIC METABOLIC PANEL
BUN: 39 mg/dL — ABNORMAL HIGH (ref 6–23)
CO2: 32 mEq/L (ref 19–32)
Calcium: 8.4 mg/dL (ref 8.4–10.5)
Chloride: 95 mEq/L — ABNORMAL LOW (ref 96–112)
Creatinine, Ser: 1.36 mg/dL — ABNORMAL HIGH (ref 0.50–1.10)
GFR calc Af Amer: 48 mL/min — ABNORMAL LOW (ref >90)
GFR calc non Af Amer: 42 mL/min — ABNORMAL LOW (ref >90)
Glucose, Bld: 102 mg/dL — ABNORMAL HIGH (ref 70–99)
Potassium: 3.4 mEq/L — ABNORMAL LOW (ref 3.5–5.1)
Sodium: 139 mEq/L (ref 135–145)

## 2012-01-13 LAB — CBC
HCT: 35.4 % — ABNORMAL LOW (ref 36.0–46.0)
Hemoglobin: 11.5 g/dL — ABNORMAL LOW (ref 12.0–15.0)
MCH: 32.1 pg (ref 26.0–34.0)
MCHC: 32.5 g/dL (ref 30.0–36.0)
MCV: 98.9 fL (ref 78.0–100.0)
Platelets: 367 10*3/uL (ref 150–400)
RBC: 3.58 MIL/uL — ABNORMAL LOW (ref 3.87–5.11)
RDW: 14.4 % (ref 11.5–15.5)
WBC: 8.7 10*3/uL (ref 4.0–10.5)

## 2012-01-13 NOTE — Anesthesia Preprocedure Evaluation (Addendum)
Anesthesia Evaluation  Patient identified by MRN, date of birth, ID band Patient awake    Reviewed: Allergy & Precautions, H&P , NPO status , Patient's Chart, lab work & pertinent test results, reviewed documented beta blocker date and time   Airway Mallampati: I TM Distance: >3 FB Neck ROM: full   Comment: H/o cleft lip/palate repair - persistent cleft palate Dental  (+) Partial Upper and Partial Lower   Pulmonary shortness of breath and with exertion, asthma (home nebs, advair, use and then bring MDI pre-op) , former smoker (quit 2008),  Home O2 - sleeps with 2-3L [place]  breath sounds clear to auscultation  Pulmonary exam normal       Cardiovascular hypertension, On Home Beta Blockers +CHF (cardiomyopathy, echo from 3/12 shows EF 45-50%, mild MR.  Normal coronaries per cardiac cath in 2010) + dysrhythmias (LBBB, first degree AV block) + Valvular Problems/Murmurs MR Rhythm:regular Rate:Normal + Systolic murmurs Requesting most recent EKG and note from cardiologist   Neuro/Psych  Headaches, PSYCHIATRIC DISORDERS Anxiety Depression    GI/Hepatic negative GI ROS, Neg liver ROS, GERD-  Medicated,  Endo/Other  Well Controlled, Type 2, Oral Hypoglycemic AgentsHypothyroidism Morbid obesity  Renal/GU      Musculoskeletal  (+) Arthritis - (right knee), Osteoarthritis,    Abdominal   Peds  Hematology  (+) anemia , Breast cancer s/p lumpectomy, chemo and radiation (11/12)   Anesthesia Other Findings Multiple allergies/intolerances: CEFTIN, SHELLFISH ALLERGY, ALLOPURINOL, LORAZEPAM, SULFA ANTIBIOTICS, ULTRAM  Reproductive/Obstetrics negative OB ROS                         Anesthesia Physical Anesthesia Plan  ASA: III  Anesthesia Plan: Spinal   Post-op Pain Management:    Induction:   Airway Management Planned:   Additional Equipment:   Intra-op Plan:   Post-operative Plan:   Informed Consent: I have  reviewed the patients History and Physical, chart, labs and discussed the procedure including the risks, benefits and alternatives for the proposed anesthesia with the patient or authorized representative who has indicated his/her understanding and acceptance.   Dental Advisory Given  Plan Discussed with: CRNA and Surgeon  Anesthesia Plan Comments:        Anesthesia Quick Evaluation

## 2012-01-13 NOTE — Patient Instructions (Addendum)
   Your procedure is scheduled on: Wednesday, 8/28  Enter through the Main Entrance of [facility] at: 6 am Pick up the phone at the desk and dial [phone] and inform us of your arrival.  Please call this number if you have any problems the morning of surgery: [phone]  Remember: Do not eat food after midnight: Tuesday Do not drink clear liquids after: Tuesday Take these medicines the morning of surgery with a SIP OF WATER: abilify, wellbutrin, coreg, clonazepam, gabapentin, levothyroxine.  Patient to bring albutero; inhaler with her on day of surgery.  Patient to withhold Metformin on Tuesday night and Wednesday morning dose  Do not wear jewelry, make-up, or FINGER nail polish No metal in your hair or on your body. Do not wear lotions, powders, perfumes or deodorant. Do not shave 48 hours prior to surgery. Do not bring valuables to the hospital. Contacts, dentures or bridgework may not be worn into surgery.  Patients discharged on the day of surgery will not be allowed to drive home.  Home with husband Ed cell [phone]  Remember to use your hibiclens as instructed.Please shower with 1/2 bottle the evening before your surgery and the other 1/2 bottle the morning of surgery. Neck down avoiding private area.

## 2012-01-13 NOTE — Pre-Procedure Instructions (Signed)
Dr Carolanne Grumbling at [facility] informed patient needs cardiac clearance, copy of EKG and any other recent 2013 studies done for Surgery to move forward Per Dr Dana Allan.  [name] at Dr [facility] office also informed.  Patient to follow up with Dr Mendel Corning this afternoon to see if an appt is needed since she was seen in 09/2011.

## 2012-01-13 NOTE — Pre-Procedure Instructions (Signed)
Dr Rodman Pickle reviewed pt history-evaluated patient-requesting clearance prior to surgery from Dr Zara Chess and left message on Dr Mayford Knife nurses' voicemail. Message also left on Dr [facility] surgery scheduler Donivan Scull voicemail that pt will require clearance prior to surgery on 8/28. Pt made aware by Janene Harvey.

## 2012-01-14 ENCOUNTER — Telehealth: Payer: Self-pay | Admitting: Oncology

## 2012-01-14 ENCOUNTER — Ambulatory Visit (HOSPITAL_BASED_OUTPATIENT_CLINIC_OR_DEPARTMENT_OTHER): Payer: Medicare Other | Admitting: Oncology

## 2012-01-14 ENCOUNTER — Encounter: Payer: Self-pay | Admitting: Oncology

## 2012-01-14 VITALS — BP 108/81 | HR 98 | Temp 98.4°F | Resp 20 | Ht 62.0 in | Wt 265.3 lb

## 2012-01-14 DIAGNOSIS — C50919 Malignant neoplasm of unspecified site of unspecified female breast: Secondary | ICD-10-CM

## 2012-01-14 DIAGNOSIS — C50419 Malignant neoplasm of upper-outer quadrant of unspecified female breast: Secondary | ICD-10-CM | POA: Diagnosis not present

## 2012-01-14 DIAGNOSIS — R609 Edema, unspecified: Secondary | ICD-10-CM | POA: Diagnosis not present

## 2012-01-14 DIAGNOSIS — F329 Major depressive disorder, single episode, unspecified: Secondary | ICD-10-CM | POA: Diagnosis not present

## 2012-01-14 DIAGNOSIS — F3289 Other specified depressive episodes: Secondary | ICD-10-CM | POA: Diagnosis not present

## 2012-01-14 NOTE — Progress Notes (Signed)
OFFICE PROGRESS NOTE  CC  Elie Confer, MD [address] [place] [address] Dr. Armanda Magic Dr. Dorothy Puffer Dr. 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  PRIOR THERAPY:  #1 patient originally uunderwent a lumpectomy that revealed a triple-negative stage I breast cancer.  #2 she then went on to see 5 cycles of adjuvant chemotherapy consisting of Taxotere carboplatinum with day 2 Neulasta. All of her therapy was completed on 12/04/2010.  #3 she also completed radiation therapy to the left breast between 12/29/2010 to 02/17/2011.  CURRENT THERAPY:Observation  INTERVAL HISTORY: Rhonda [name] 61 y.o. female returns forFollowup visit today. Overall she seems to be doing well. She continues to have anxiety and depression. She otherwise denies any fevers chills night sweats headaches she does have some shortness of breath fatigue. She denies having any nausea or vomiting. She does have arthritic pains especially her right knee she also significant back problems including disc problems and does have lower back pain. She does have history of congestive heart failure and she is managed by her cardiologist for this. She denies any breast masses. She does have some tenderness at the surgical site. She has some lower extremity swelling but nothing that has changed from her previous exams. Remainder of the 10 point review of systems is negative.  MEDICAL HISTORY: Past Medical History  Diagnosis Date  . Unspecified vitamin D deficiency 03/26/2011    does not take meds  . Asthma   . Diabetes mellitus   . Hypertension   . Heart murmur   . Cardiomyopathy     [name]  . Shortness of breath     WITH EXERTION  . Hypothyroidism   . Anemia   . GERD (gastroesophageal reflux disease)     NEXIUM ONLY IF NEEDED  .  Breast cancer, stage 1 03/26/2011    left-FINISHED CHEMO  AND RADIATION  . Arthritis     RIGHT KNEE ARTHRITIS AND PAIN-PT TOLD BONE ON BONE  . Depression   . Back pain     DISK PROBLEM  . Complication of anesthesia     PT STATES HER B/P LOW AFTER ONE OF HER SURGERIES--SHE ATTRIBUTES TO LYING FLAT  . CHF (congestive heart failure)     CHRONIC SYSTOLIC HEART FAILURE-PER [facility] OFFICE NOTE 07/07/11   . Left bundle branch block     CHRONIC PER [facility] OFFICE NOTES 07/07/11.  . Gout   . SVD (spontaneous vaginal delivery)     x 2  . Hyperlipidemia   . Anxiety   . Headache      hx - migraines, last one 3 mos ago, uses otc med prn  . Neuromuscular disorder     feet     ALLERGIES:  is allergic to ceftin; shellfish allergy; allopurinol; lorazepam; sulfa antibiotics; and ultram.  MEDICATIONS:  Current Outpatient Prescriptions  Medication Sig Dispense Refill  . PARoxetine (PAXIL-CR) 25 MG 24 hr tablet Take 25 mg by mouth every morning.       Marland Kitchen albuterol (PROVENTIL HFA;VENTOLIN HFA) 108 (90 BASE) MCG/ACT inhaler Inhale 2 puffs into the lungs every 6 (six) hours as needed. Wheezing       . albuterol (PROVENTIL) (2.5 MG/3ML) 0.083% nebulizer solution Take 2.5 mg by nebulization every 6 (six) hours as needed. Wheezing       . ARIPiprazole (ABILIFY) 2  MG tablet Take 2 mg by mouth daily.      Marland Kitchen buPROPion (WELLBUTRIN XL) 150 MG 24 hr tablet Take 300 mg by mouth daily.      . carvedilol (COREG) 6.25 MG tablet Take 6.25 mg by mouth 2 (two) times daily with a meal.       . clonazePAM (KLONOPIN) 0.5 MG tablet Take 0.5 mg by mouth 4 (four) times daily. 2 iin morning, 1 at bedtime      . febuxostat (ULORIC) 40 MG tablet Take 40 mg by mouth at bedtime.       . Fluticasone-Salmeterol (ADVAIR) 250-50 MCG/DOSE AEPB Inhale 1 puff into the lungs every 12 (twelve) hours.       . gabapentin (NEURONTIN) 300 MG capsule Take 300 mg by mouth 3 (three) times daily.      Marland Kitchen HYDROcodone-acetaminophen  (NORCO) 10-325 MG per tablet Take 1 tablet by mouth every 6 (six) hours as needed for pain.  20 tablet  0  . levothyroxine (SYNTHROID, LEVOTHROID) 125 MCG tablet Take 125 mcg by mouth daily.      . metFORMIN (GLUCOPHAGE) 500 MG tablet Take 500 mg by mouth 2 (two) times daily with a meal.       . NON FORMULARY Oxygen        . potassium chloride SA (K-DUR,KLOR-CON) 20 MEQ tablet Take 20 mEq by mouth daily.       . simvastatin (ZOCOR) 10 MG tablet Take 10 mg by mouth at bedtime.       . torsemide (DEMADEX) 20 MG tablet Take 40 mg by mouth daily.       Marland Kitchen zolpidem (AMBIEN) 5 MG tablet Take 5 mg by mouth at bedtime as needed. Sleep         SURGICAL HISTORY:  Past Surgical History  Procedure Date  . Portacath placement 2012  . 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 Cardiac catheterization 2010    NORMAL CORONARY ARTERIES  . Port-a-cath removal 09/23/2011    Procedure: REMOVAL PORT-A-CATH;  Surgeon: Emelia Loron, MD;  Location: [facility];  Service: General;  Laterality: N/A;  Port Removal  . Breast surgery 07/28/2010    lumpectomy , snbx - left  . Wisdom tooth extraction   . Colonoscopy     REVIEW OF SYSTEMS:  Pertinent items are noted in HPI.   PHYSICAL EXAMINATION: General appearance: alert, cooperative and appears older than stated age Lymph nodes: Cervical, supraclavicular, and axillary nodes normal. Resp: patient has bilateral scattered wheezes, no rales or rhonchi Back: symmetric, no curvature. ROM normal. No CVA tenderness. Cardio: regularly irregular rhythm GI: soft, non-tender; bowel sounds normal; no masses,  no organomegaly Extremities: +1 edema bilaterally Neurologic: Grossly normal Bilateral breast examination: Left breast reveals well-healed incisional scar this breast is slightly better than the right breast. There is skin changes including darkening but no other excoriation no masses no nipple  discharge. Right breast no masses or nipple discharge.  ECOG PERFORMANCE STATUS: 1 - Symptomatic but completely ambulatory  Blood pressure 108/81, pulse 98, temperature 98.4 F (36.9 C), temperature source Oral, resp. rate 20, height 5\' 2"  (1.575 m), weight 265 lb 4.8 oz (120.339 kg).  LABORATORY DATA: Lab Results  Component Value Date   WBC 8.7 01/13/2012   HGB 11.5* 01/13/2012   HCT 35.4* 01/13/2012   MCV 98.9 01/13/2012   PLT 367 01/13/2012  Chemistry      Component Value Date/Time   NA 139 01/13/2012 1233   K 3.4* 01/13/2012 1233   CL 95* 01/13/2012 1233   CO2 32 01/13/2012 1233   BUN 39* 01/13/2012 1233   CREATININE 1.36* 01/13/2012 1233      Component Value Date/Time   CALCIUM 8.4 01/13/2012 1233   ALKPHOS 128* 07/16/2011 1052   AST 14 07/16/2011 1052   ALT 11 07/16/2011 1052   BILITOT 0.3 07/16/2011 1052       RADIOGRAPHIC STUDIES:  No results found.  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 After that she went on to receive chemotherapy consisting of 5 cycles of Taxotere or and carboplatinum she completed all of her therapy in July 2012. She has also completed radiation therapy to the left breast as of September 2012. Patient is without any evidence of recurrent disease.  #2 patient does have multiple other medical problems and she is managed by her primary care physician for these.   PLAN:   #1 patient will continue to be followed every 6 months. Overall I do think she is doing well.  #2 she will continue to get her annual diagnostic mammograms.  #3 she is recommended doing self breast examinations.  #4 patient will return in 6 months time.   All questions were answered. The patient knows to call the clinic with any problems, questions or concerns. We can certainly see the patient much sooner if necessary.  I  spent >15 minutes counseling the patient face to face. The total time spent in the appointment was 30 minutes.    Drue Second, MD Medical/Oncology [facility] [phone] (beeper) [phone] (Office)  01/14/2012, 12:00 PM

## 2012-01-14 NOTE — Patient Instructions (Addendum)
Doing well.  I will see you back in 4 months

## 2012-01-14 NOTE — Telephone Encounter (Signed)
gve the pt her dec 2013 appt calendar °

## 2012-01-19 MED ORDER — METRONIDAZOLE IN NACL 5-0.79 MG/ML-% IV SOLN
500.0000 mg | INTRAVENOUS | Status: AC
  Administered 2012-01-20: 500 mg via INTRAVENOUS
  Filled 2012-01-19: qty 100

## 2012-01-19 MED ORDER — CIPROFLOXACIN IN D5W 400 MG/200ML IV SOLN
400.0000 mg | INTRAVENOUS | Status: AC
  Administered 2012-01-20: 400 mg via INTRAVENOUS
  Filled 2012-01-19: qty 200

## 2012-01-20 ENCOUNTER — Ambulatory Visit (HOSPITAL_COMMUNITY): Payer: Medicare Other | Admitting: Anesthesiology

## 2012-01-20 ENCOUNTER — Encounter (HOSPITAL_COMMUNITY): Payer: Self-pay | Admitting: Anesthesiology

## 2012-01-20 ENCOUNTER — Encounter (HOSPITAL_COMMUNITY)
Admission: RE | Disposition: A | Discharge status: Home / Self Care | Payer: Self-pay | Source: Ambulatory Visit | Attending: Obstetrics and Gynecology

## 2012-01-20 ENCOUNTER — Ambulatory Visit (HOSPITAL_COMMUNITY)
Admission: RE | Admit: 2012-01-20 | Discharge: 2012-01-20 | Disposition: A | Discharge status: Home / Self Care | Payer: Medicare Other | Source: Ambulatory Visit | Attending: Obstetrics and Gynecology | Admitting: Obstetrics and Gynecology

## 2012-01-20 DIAGNOSIS — N840 Polyp of corpus uteri: Secondary | ICD-10-CM

## 2012-01-20 DIAGNOSIS — N950 Postmenopausal bleeding: Secondary | ICD-10-CM | POA: Diagnosis not present

## 2012-01-20 HISTORY — DX: Polyp of corpus uteri: N84.0

## 2012-01-20 HISTORY — PX: HYSTEROSCOPY WITH D & C: SHX1775

## 2012-01-20 LAB — GLUCOSE, CAPILLARY: Glucose-Capillary: 130 mg/dL — ABNORMAL HIGH (ref 70–99)

## 2012-01-20 SURGERY — DILATATION AND CURETTAGE /HYSTEROSCOPY
Anesthesia: Spinal | Site: Uterus | Wound class: Clean Contaminated

## 2012-01-20 MED ORDER — FENTANYL CITRATE 0.05 MG/ML IJ SOLN
INTRAMUSCULAR | Status: AC
  Filled 2012-01-20: qty 2

## 2012-01-20 MED ORDER — KETOROLAC TROMETHAMINE 30 MG/ML IJ SOLN: 15.0000 mg | Freq: Once | INTRAMUSCULAR | Status: DC | PRN

## 2012-01-20 MED ORDER — LIDOCAINE HCL (CARDIAC) 20 MG/ML IV SOLN
INTRAVENOUS | Status: AC
  Filled 2012-01-20: qty 5

## 2012-01-20 MED ORDER — ONDANSETRON HCL 4 MG/2ML IJ SOLN
INTRAMUSCULAR | Status: DC | PRN
  Administered 2012-01-20: 4 mg via INTRAVENOUS

## 2012-01-20 MED ORDER — PROPOFOL 10 MG/ML IV EMUL
INTRAVENOUS | Status: AC
  Filled 2012-01-20: qty 20

## 2012-01-20 MED ORDER — FENTANYL CITRATE 0.05 MG/ML IJ SOLN
25.0000 ug | INTRAMUSCULAR | Status: DC | PRN
  Administered 2012-01-20 (×2): 50 ug via INTRAVENOUS

## 2012-01-20 MED ORDER — LIDOCAINE HCL 1 % IJ SOLN
INTRAMUSCULAR | Status: DC | PRN
  Administered 2012-01-20: 2 mL

## 2012-01-20 MED ORDER — FENTANYL CITRATE 0.05 MG/ML IJ SOLN
INTRAMUSCULAR | Status: AC
  Administered 2012-01-20: 50 ug via INTRAVENOUS
  Filled 2012-01-20: qty 2

## 2012-01-20 MED ORDER — PROMETHAZINE HCL 25 MG/ML IJ SOLN: 6.2500 mg | INTRAMUSCULAR | Status: DC | PRN

## 2012-01-20 MED ORDER — LACTATED RINGERS IV SOLN
INTRAVENOUS | Status: DC
  Administered 2012-01-20: 07:00:00 via INTRAVENOUS

## 2012-01-20 MED ORDER — ONDANSETRON HCL 4 MG/2ML IJ SOLN
INTRAMUSCULAR | Status: AC
  Filled 2012-01-20: qty 2

## 2012-01-20 MED ORDER — MIDAZOLAM HCL 2 MG/2ML IJ SOLN
INTRAMUSCULAR | Status: AC
  Filled 2012-01-20: qty 2

## 2012-01-20 MED ORDER — FENTANYL CITRATE 0.05 MG/ML IJ SOLN
INTRAMUSCULAR | Status: DC | PRN
  Administered 2012-01-20 (×2): 25 ug via INTRAVENOUS
  Administered 2012-01-20: 50 ug via INTRAVENOUS

## 2012-01-20 MED ORDER — ACETAMINOPHEN 10 MG/ML IV SOLN
1000.0000 mg | Freq: Four times a day (QID) | INTRAVENOUS | Status: DC | PRN
  Administered 2012-01-20: 1000 mg via INTRAVENOUS
  Filled 2012-01-20: qty 100

## 2012-01-20 MED ORDER — DEXAMETHASONE SODIUM PHOSPHATE 10 MG/ML IJ SOLN
INTRAMUSCULAR | Status: AC
  Filled 2012-01-20: qty 1

## 2012-01-20 MED ORDER — PROPOFOL 10 MG/ML IV BOLUS
INTRAVENOUS | Status: DC | PRN
  Administered 2012-01-20 (×3): 50 mg via INTRAVENOUS

## 2012-01-20 MED ORDER — BUPIVACAINE IN DEXTROSE 0.75-8.25 % IT SOLN
INTRATHECAL | Status: DC | PRN
  Administered 2012-01-20: 1.2 mL via INTRATHECAL

## 2012-01-20 MED ORDER — MEPERIDINE HCL 25 MG/ML IJ SOLN: 6.2500 mg | INTRAMUSCULAR | Status: DC | PRN

## 2012-01-20 MED ORDER — PROPOFOL 10 MG/ML IV BOLUS: INTRAVENOUS | Status: DC | PRN

## 2012-01-20 SURGICAL SUPPLY — 14 items
CANISTER SUCTION 2500CC (MISCELLANEOUS) ×2 IMPLANT
CATH ROBINSON RED A/P 16FR (CATHETERS) ×2 IMPLANT
CLOTH BEACON ORANGE TIMEOUT ST (SAFETY) ×2 IMPLANT
CONTAINER PREFILL 10% NBF 60ML (FORM) ×4 IMPLANT
DRAPE HYSTEROSCOPY (DRAPE) ×2 IMPLANT
DRESSING TELFA 8X3 (GAUZE/BANDAGES/DRESSINGS) ×2 IMPLANT
ELECT REM PT RETURN 9FT ADLT (ELECTROSURGICAL)
ELECTRODE REM PT RTRN 9FT ADLT (ELECTROSURGICAL) IMPLANT
GLOVE BIO SURGEON STRL SZ7 (GLOVE) ×4 IMPLANT
GOWN STRL REIN XL XLG (GOWN DISPOSABLE) ×6 IMPLANT
LOOP ANGLED CUTTING 22FR (CUTTING LOOP) IMPLANT
PACK VAGINAL MINOR WOMEN LF (CUSTOM PROCEDURE TRAY) ×2 IMPLANT
TOWEL OR 17X24 6PK STRL BLUE (TOWEL DISPOSABLE) ×4 IMPLANT
WATER STERILE IRR 1000ML POUR (IV SOLUTION) ×2 IMPLANT

## 2012-01-20 NOTE — Anesthesia Postprocedure Evaluation (Signed)
Anesthesia Post Note  Patient: Rhonda [name]  Procedure(s) Performed: Procedure(s) (LRB): DILATATION AND CURETTAGE /HYSTEROSCOPY (N/A)  Anesthesia type: Spinal  Patient location: PACU  Post pain: Pain level controlled  Post assessment: Post-op Vital signs reviewed  Last Vitals:  Filed Vitals:   01/20/12 0915  BP: 105/60  Pulse: 65  Temp: 36.4 C  Resp: 13    Post vital signs: Reviewed  Level of consciousness: awake  Complications: No apparent anesthesia complications

## 2012-01-20 NOTE — Transfer of Care (Signed)
Immediate Anesthesia Transfer of Care Note  Patient: Rhonda [name]  Procedure(s) Performed: Procedure(s) (LRB): DILATATION AND CURETTAGE /HYSTEROSCOPY (N/A)  Patient Location: PACU  Anesthesia Type: Spinal  Level of Consciousness: awake, alert  and oriented  Airway & Oxygen Therapy: Patient Spontanous Breathing  Post-op Assessment: Report given to PACU RN and Post -op Vital signs reviewed and stable  Post vital signs: Reviewed and stable  Complications: No apparent anesthesia complications

## 2012-01-20 NOTE — Op Note (Signed)
Preoperative diagnosis: Endometrial polyp, postmenopausal bleeding  Postoperative diagnosis: Same  Procedure: D&C, hysteroscopy with endometrial polyp resection by true clear  Surgeon: Marcelle Overlie  Specimens removed: Endometrial curettings, fragments of endometrial polyp, to pathology  Complications: None  Anesthesia: Spinal  EBL: Minimal  Procedure and findings:  The patient was taken to the operating room after an adequate level of spinal anesthetic was obtained with the patient's legs in stirrups the perineum and vagina were prepped and draped in usual fashion for D&C, hysteroscopy, the bladder was drained, EUA carried out uterus was midposition mobile normal size adnexa negative. Appropriate timeout to been taken previously  Uterus was sounded to 8 cm progressively dilated to a 27 Pratt dilator the true clear hysteroscope was then used to perform hysteroscopy revealing a well-defined posterior fundal soft tissue polyp no other abnormalities were noted the small true clear morcellator was then used to resect the polyp completely. Sharp curettage was carried out and sent also as endometrial curettings repeat hysteroscopy carried out at that point revealing minimal bleeding, complete resection of polyp. Patient stable at the end of procedure, to recovery room  Dictated with [facility]  Annastyn Silvey M. Milana Obey.D.

## 2012-01-20 NOTE — Progress Notes (Signed)
Pt concerned about pain/ cramping at home, states she is out of pain med at home. Dr Marcelle Overlie called, states he will call in Tylenol with codeine to her pharmacy

## 2012-01-20 NOTE — Progress Notes (Signed)
The patient was re-examined with no change in status 

## 2012-01-20 NOTE — Anesthesia Procedure Notes (Addendum)
Spinal  Patient location during procedure: OR Start time: 01/20/2012 7:34 AM Staffing Anesthesiologist: Brayton Caves R Performed by: anesthesiologist  Preanesthetic Checklist Completed: patient identified, site marked, surgical consent, pre-op evaluation, timeout performed, IV checked, risks and benefits discussed and monitors and equipment checked Spinal Block Patient position: sitting Prep: DuraPrep Patient monitoring: heart rate, cardiac monitor, continuous pulse ox and blood pressure Approach: midline Location: L3-4 Injection technique: single-shot Needle Needle type: Sprotte  Needle gauge: 24 G Needle length: 9 cm Assessment Sensory level: T4 Additional Notes Patient identified.  Risk benefits discussed including failed block, incomplete pain control, headache, nerve damage, paralysis, blood pressure changes, nausea, vomiting, reactions to medication both toxic or allergic, and postpartum back pain.  Patient expressed understanding and wished to proceed.  All questions were answered.  Sterile technique used throughout procedure.  CSF was clear.  No parasthesia or other complications.  Please see nursing notes for vital signs.   Procedure Name: MAC Date/Time: 01/20/2012 7:25 AM Performed by: Kendal Hymen Pre-anesthesia Checklist: Patient identified and Timeout performed Patient Re-evaluated:Patient Re-evaluated prior to inductionOxygen Delivery Method: Simple face mask Preoxygenation: Pre-oxygenation with 100% oxygen Intubation Type: IV induction Dental Injury: Teeth and Oropharynx as per pre-operative assessment

## 2012-01-21 ENCOUNTER — Encounter (HOSPITAL_COMMUNITY): Payer: Self-pay | Admitting: Obstetrics and Gynecology

## 2012-01-21 DIAGNOSIS — M1710 Unilateral primary osteoarthritis, unspecified knee: Secondary | ICD-10-CM | POA: Diagnosis not present

## 2012-01-21 DIAGNOSIS — IMO0002 Reserved for concepts with insufficient information to code with codable children: Secondary | ICD-10-CM | POA: Diagnosis not present

## 2012-01-27 ENCOUNTER — Ambulatory Visit (INDEPENDENT_AMBULATORY_CARE_PROVIDER_SITE_OTHER): Payer: Medicare Other | Admitting: Psychiatry

## 2012-01-27 DIAGNOSIS — Z638 Other specified problems related to primary support group: Secondary | ICD-10-CM

## 2012-01-27 DIAGNOSIS — F0630 Mood disorder due to known physiological condition, unspecified: Secondary | ICD-10-CM | POA: Diagnosis not present

## 2012-02-25 ENCOUNTER — Ambulatory Visit (INDEPENDENT_AMBULATORY_CARE_PROVIDER_SITE_OTHER): Payer: Medicare Other | Admitting: Psychiatry

## 2012-02-25 DIAGNOSIS — Z638 Other specified problems related to primary support group: Secondary | ICD-10-CM | POA: Diagnosis not present

## 2012-02-25 DIAGNOSIS — F0630 Mood disorder due to known physiological condition, unspecified: Secondary | ICD-10-CM | POA: Diagnosis not present

## 2012-03-02 DIAGNOSIS — M1710 Unilateral primary osteoarthritis, unspecified knee: Secondary | ICD-10-CM | POA: Diagnosis not present

## 2012-03-02 DIAGNOSIS — IMO0002 Reserved for concepts with insufficient information to code with codable children: Secondary | ICD-10-CM | POA: Diagnosis not present

## 2012-03-09 DIAGNOSIS — F0630 Mood disorder due to known physiological condition, unspecified: Secondary | ICD-10-CM | POA: Diagnosis not present

## 2012-03-16 DIAGNOSIS — J45909 Unspecified asthma, uncomplicated: Secondary | ICD-10-CM | POA: Diagnosis not present

## 2012-03-16 DIAGNOSIS — N183 Chronic kidney disease, stage 3 unspecified: Secondary | ICD-10-CM | POA: Diagnosis not present

## 2012-03-16 DIAGNOSIS — Z23 Encounter for immunization: Secondary | ICD-10-CM | POA: Diagnosis not present

## 2012-03-16 DIAGNOSIS — E039 Hypothyroidism, unspecified: Secondary | ICD-10-CM | POA: Diagnosis not present

## 2012-03-16 DIAGNOSIS — I10 Essential (primary) hypertension: Secondary | ICD-10-CM | POA: Diagnosis not present

## 2012-03-16 DIAGNOSIS — Z79899 Other long term (current) drug therapy: Secondary | ICD-10-CM | POA: Diagnosis not present

## 2012-03-16 DIAGNOSIS — E1129 Type 2 diabetes mellitus with other diabetic kidney complication: Secondary | ICD-10-CM | POA: Diagnosis not present

## 2012-03-16 DIAGNOSIS — M109 Gout, unspecified: Secondary | ICD-10-CM | POA: Diagnosis not present

## 2012-03-16 DIAGNOSIS — F3289 Other specified depressive episodes: Secondary | ICD-10-CM | POA: Diagnosis not present

## 2012-03-16 DIAGNOSIS — F329 Major depressive disorder, single episode, unspecified: Secondary | ICD-10-CM | POA: Diagnosis not present

## 2012-03-16 DIAGNOSIS — D649 Anemia, unspecified: Secondary | ICD-10-CM | POA: Diagnosis not present

## 2012-03-31 ENCOUNTER — Ambulatory Visit: Payer: Medicare Other | Admitting: Psychiatry

## 2012-04-06 DIAGNOSIS — M25569 Pain in unspecified knee: Secondary | ICD-10-CM | POA: Diagnosis not present

## 2012-04-06 DIAGNOSIS — Z01818 Encounter for other preprocedural examination: Secondary | ICD-10-CM | POA: Diagnosis not present

## 2012-04-06 DIAGNOSIS — I447 Left bundle-branch block, unspecified: Secondary | ICD-10-CM | POA: Diagnosis not present

## 2012-04-06 DIAGNOSIS — I10 Essential (primary) hypertension: Secondary | ICD-10-CM | POA: Diagnosis not present

## 2012-04-06 DIAGNOSIS — I5022 Chronic systolic (congestive) heart failure: Secondary | ICD-10-CM | POA: Diagnosis not present

## 2012-04-06 DIAGNOSIS — I428 Other cardiomyopathies: Secondary | ICD-10-CM | POA: Diagnosis not present

## 2012-04-15 DIAGNOSIS — Z09 Encounter for follow-up examination after completed treatment for conditions other than malignant neoplasm: Secondary | ICD-10-CM | POA: Diagnosis not present

## 2012-04-15 DIAGNOSIS — Z86010 Personal history of colonic polyps: Secondary | ICD-10-CM | POA: Diagnosis not present

## 2012-04-28 ENCOUNTER — Ambulatory Visit: Payer: Medicare Other | Admitting: Oncology

## 2012-04-28 ENCOUNTER — Other Ambulatory Visit: Payer: Medicare Other | Admitting: Lab

## 2012-04-28 ENCOUNTER — Ambulatory Visit: Payer: Medicare Other | Admitting: Psychiatry

## 2012-05-02 ENCOUNTER — Encounter (HOSPITAL_COMMUNITY): Payer: Self-pay | Admitting: Pharmacy Technician

## 2012-05-03 DIAGNOSIS — H04129 Dry eye syndrome of unspecified lacrimal gland: Secondary | ICD-10-CM | POA: Diagnosis not present

## 2012-05-03 DIAGNOSIS — H16149 Punctate keratitis, unspecified eye: Secondary | ICD-10-CM | POA: Diagnosis not present

## 2012-05-03 DIAGNOSIS — E119 Type 2 diabetes mellitus without complications: Secondary | ICD-10-CM | POA: Diagnosis not present

## 2012-05-04 DIAGNOSIS — IMO0002 Reserved for concepts with insufficient information to code with codable children: Secondary | ICD-10-CM | POA: Diagnosis not present

## 2012-05-04 DIAGNOSIS — M1710 Unilateral primary osteoarthritis, unspecified knee: Secondary | ICD-10-CM | POA: Diagnosis not present

## 2012-05-05 ENCOUNTER — Encounter (HOSPITAL_COMMUNITY): Payer: Self-pay

## 2012-05-05 ENCOUNTER — Encounter (HOSPITAL_COMMUNITY)
Admission: RE | Admit: 2012-05-05 | Discharge: 2012-05-05 | Disposition: A | Discharge status: Home / Self Care | Payer: Medicare Other | Source: Ambulatory Visit | Attending: Orthopedic Surgery | Admitting: Orthopedic Surgery

## 2012-05-05 LAB — CBC
HCT: 33.7 % — ABNORMAL LOW (ref 36.0–46.0)
Hemoglobin: 11.4 g/dL — ABNORMAL LOW (ref 12.0–15.0)
MCH: 32.8 pg (ref 26.0–34.0)
MCHC: 33.8 g/dL (ref 30.0–36.0)
MCV: 96.8 fL (ref 78.0–100.0)
Platelets: 355 10*3/uL (ref 150–400)
RBC: 3.48 MIL/uL — ABNORMAL LOW (ref 3.87–5.11)
RDW: 14.8 % (ref 11.5–15.5)
WBC: 5.6 10*3/uL (ref 4.0–10.5)

## 2012-05-05 LAB — ABO/RH: ABO/RH(D): AB POS

## 2012-05-05 LAB — URINALYSIS, ROUTINE W REFLEX MICROSCOPIC
Bilirubin Urine: NEGATIVE
Glucose, UA: NEGATIVE mg/dL
Hgb urine dipstick: NEGATIVE
Ketones, ur: NEGATIVE mg/dL
Leukocytes, UA: NEGATIVE
Nitrite: NEGATIVE
Protein, ur: NEGATIVE mg/dL
Specific Gravity, Urine: 1.017 (ref 1.005–1.030)
Urobilinogen, UA: 0.2 mg/dL (ref 0.0–1.0)
pH: 5.5 (ref 5.0–8.0)

## 2012-05-05 LAB — BASIC METABOLIC PANEL
BUN: 26 mg/dL — ABNORMAL HIGH (ref 6–23)
CO2: 33 mEq/L — ABNORMAL HIGH (ref 19–32)
Calcium: 9.1 mg/dL (ref 8.4–10.5)
Chloride: 99 mEq/L (ref 96–112)
Creatinine, Ser: 1.41 mg/dL — ABNORMAL HIGH (ref 0.50–1.10)
GFR calc Af Amer: 46 mL/min — ABNORMAL LOW (ref >90)
GFR calc non Af Amer: 40 mL/min — ABNORMAL LOW (ref >90)
Glucose, Bld: 107 mg/dL — ABNORMAL HIGH (ref 70–99)
Potassium: 3.2 mEq/L — ABNORMAL LOW (ref 3.5–5.1)
Sodium: 142 mEq/L (ref 135–145)

## 2012-05-05 LAB — SURGICAL PCR SCREEN
MRSA, PCR: NEGATIVE
Staphylococcus aureus: NEGATIVE

## 2012-05-05 LAB — PROTIME-INR
INR: 1 (ref 0.00–1.49)
Prothrombin Time: 13.1 seconds (ref 11.6–15.2)

## 2012-05-05 LAB — APTT: aPTT: 32 seconds (ref 24–37)

## 2012-05-05 NOTE — Pre-Procedure Instructions (Addendum)
05-05-12 NFA(2'13), YQM(5'78) . Echo report, heart cath report with chart. 05-05-12 1530 Epic labs viewable,fax to Dr. Charlann Boxer to note BMP result. W.Winta Barcelo,RN

## 2012-05-05 NOTE — Patient Instructions (Addendum)
20 GWENETH FREDLUND  05/05/2012   Your procedure is scheduled on: 12-17  -2013  Report to [facility] at   1000     AM.  Call this number if you have problems the morning of surgery: [phone]  Or Presurgical Testing [phone](Javen Ridings)      Do not eat food:After Midnight.  May have clear liquids:up to 6 Hours before arrival. Nothing after : 0600 AM  Clear liquids include soda, tea, black coffee, apple or grape juice, broth.  Take these medicines the morning of surgery with A SIP OF WATER: Abilify. Wellbutrin. Carvedilol. Clonazepam. Neurontin. Paxil. Synthroid. Use and Bring eye drops. Use and bring Inhalers- Albuterol. ProAir,Advair.   Do not wear jewelry, make-up or nail polish.  Do not wear lotions, powders, or perfumes. You may wear deodorant.  Do not shave 48 hours prior to surgery.(face and neck okay, no shaving of legs)  Do not bring valuables to the hospital.  Contacts, dentures or bridgework may not be worn into surgery.  Leave suitcase in the car. After surgery it may be brought to your room.  For patients admitted to the hospital, checkout time is 11:00 AM the day of discharge.   Patients discharged the day of surgery will not be allowed to drive home. Must have responsible person with you x 24 hours once discharged.  Name and phone number of your driver: malita, ignasiak [phone] cell  Special Instructions: CHG Shower Use Special Wash: see special instructions.(avoid face and genitals)   Please read over the following fact sheets that you were given: MRSA Information, Blood Transfusion fact sheet, Incentive Spirometry Instruction.    Failure to follow these instructions may result in Cancellation of your surgery.   Patient signature_______________________________________________________

## 2012-05-06 NOTE — H&P (Signed)
TOTAL KNEE ADMISSION H&P  Patient is being admitted for right total knee arthroplasty.  Subjective:  Chief Complaint:  Right knee OA / pain.  HPI: Rhonda [name], 61 y.o. female, has a history of pain and functional disability in the right knee due to arthritis and has failed non-surgical conservative treatments for greater than 12 weeks to includeNSAID's and/or analgesics, corticosteriod injections, weight reduction as appropriate and activity modification.  Onset of symptoms was gradual, starting 4 years ago with gradually worsening course since that time. The patient noted no past surgery on the right knee(s).  Patient currently rates pain in the right knee(s) at 8 out of 10 with activity. Patient has worsening of pain with activity and weight bearing, pain that interferes with activities of daily living, pain with passive range of motion, crepitus and joint swelling.  Patient has evidence of periarticular osteophytes and joint space narrowing by imaging studies.  There is no active infection. Risks, benefits and expectations were discussed with the patient. Patient understand the risks, benefits and expectations and wishes to proceed with surgery.   D/C Plans:  Home with HHPT vs SNF  Post-op Meds:  No Rx given   Tranexamic Acid:  Not to be given - CAD  Decadron:   Not to be given - DM  Patient Active Problem List   Diagnosis Date Noted  . Endometrial polyp 01/20/2012  . Breast cancer, stage 1 03/26/2011  . Unspecified vitamin D deficiency 03/26/2011   Past Medical History  Diagnosis Date  . Unspecified vitamin D deficiency 03/26/2011    does not take meds  . Asthma   . Diabetes mellitus   . Hypertension   . Heart murmur   . Cardiomyopathy     [name]  . Shortness of breath     WITH EXERTION  . Hypothyroidism   . Anemia   . GERD (gastroesophageal reflux disease)     NEXIUM ONLY IF NEEDED  . Breast cancer, stage 1 03/26/2011    left-FINISHED  CHEMO  AND RADIATION  . Arthritis     RIGHT KNEE ARTHRITIS AND PAIN-PT TOLD BONE ON BONE  . Depression   . Back pain     DISK PROBLEM  . Complication of anesthesia     PT STATES HER B/P LOW AFTER ONE OF HER SURGERIES--SHE ATTRIBUTES TO LYING FLAT  . CHF (congestive heart failure)     CHRONIC SYSTOLIC HEART FAILURE-PER [facility] OFFICE NOTE 07/07/11   . Left bundle branch block     CHRONIC PER [facility] OFFICE NOTES 07/07/11.  . Gout   . SVD (spontaneous vaginal delivery)     x 2  . Hyperlipidemia   . Anxiety   . Headache      hx - migraines, last one 3 mos ago, uses otc med prn  . Dry eyes, bilateral   . Neuromuscular disorder     feet -neuropathy    Past Surgical History  Procedure Date  . Portacath placement 2012  . 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 Cardiac catheterization 2010    NORMAL CORONARY ARTERIES  . Port-a-cath removal 09/23/2011    Procedure: REMOVAL PORT-A-CATH;  Surgeon: Emelia Loron, MD;  Location: [facility];  Service: General;  Laterality: N/A;  Port Removal  . Breast surgery 07/28/2010    lumpectomy , snbx - left  . Wisdom tooth extraction   .  Colonoscopy   . Hysteroscopy w/d&c 01/20/2012    Procedure: DILATATION AND CURETTAGE /HYSTEROSCOPY;  Surgeon: Meriel Pica, MD;  Location: [facility];  Service: Gynecology;  Laterality: N/A;  with trueclear    No prescriptions prior to admission   Allergies  Allergen Reactions  . Ceftin Anaphylaxis    Face and throat swell   . Shellfish Allergy Other (See Comments)    Gout exacerbation  . Allopurinol Nausea Only and Other (See Comments)    weakness  . Lorazepam Itching  . Sulfa Antibiotics Itching  . Ultram (Tramadol Hcl) Itching    History  Substance Use Topics  . Smoking status: Former Smoker -- 1.0 packs/day for 5 years    Types: Cigarettes    Quit date: 05/06/2007  . Smokeless tobacco: Never Used     Comment: I  PP PER WEEK FOR MAYBE 5 YRS-QUIT SMOKING ABOUT 2008  . Alcohol Use: No    Family History  Problem Relation Age of Onset  . Birth defects Paternal Uncle      Review of Systems  Constitutional: Negative.   HENT: Negative.   Eyes: Negative.   Respiratory: Negative.   Cardiovascular: Negative.   Gastrointestinal: Negative.   Genitourinary: Negative.   Musculoskeletal: Positive for joint pain.  Skin: Negative.   Neurological: Negative.   Endo/Heme/Allergies: Negative.   Psychiatric/Behavioral: Negative.     Objective:  Physical Exam  Constitutional: She is oriented to person, place, and time. She appears well-developed and well-nourished.  HENT:  Head: Normocephalic and atraumatic.  Mouth/Throat: Oropharynx is clear and moist.  Eyes: Pupils are equal, round, and reactive to light.  Neck: Neck supple. No JVD present. No tracheal deviation present. No thyromegaly present.  Cardiovascular: Normal rate, regular rhythm and intact distal pulses.   Murmur heard. Respiratory: Effort normal and breath sounds normal. No stridor. No respiratory distress. She has no wheezes.  GI: Soft. There is no tenderness. There is no guarding.  Musculoskeletal:       Right knee: She exhibits decreased range of motion, swelling and bony tenderness. She exhibits no effusion, no ecchymosis, no laceration and no erythema. tenderness found.  Lymphadenopathy:    She has no cervical adenopathy.  Neurological: She is alert and oriented to person, place, and time.  Skin: Skin is warm and dry.  Psychiatric: She has a normal mood and affect.    Vital signs in last 24 hours: Temp:  [97 F (36.1 C)] 97 F (36.1 C) (12/12 1004) Pulse Rate:  [84] 84  (12/12 1004) Resp:  [18] 18  (12/12 1004) BP: (142)/(88) 142/88 mmHg (12/12 1004) SpO2:  [95 %] 95 % (12/12 1004) Weight:  [122.925 kg (271 lb)] 122.925 kg (271 lb) (12/12 1004)  Labs:   Estimated Body mass index is 48.52 kg/(m^2) as calculated from the  following:   Height as of 01/14/12: 5\' 2" (1.575 m).   Weight as of 01/14/12: 265 lb 4.8 oz(120.339 kg).   Imaging Review Plain radiographs demonstrate severe degenerative joint disease of the right knee(s). The overall alignment isneutral. The bone quality appears to be good for age and reported activity level.  Assessment/Plan:  End stage arthritis, right knee   The patient history, physical examination, clinical judgment of the provider and imaging studies are consistent with end stage degenerative joint disease of the right knee(s) and total knee arthroplasty is deemed medically necessary. The treatment options including medical management, injection therapy arthroscopy and arthroplasty were discussed at length. The risks and benefits of  total knee arthroplasty were presented and reviewed. The risks due to aseptic loosening, infection, stiffness, patella tracking problems, thromboembolic complications and other imponderables were discussed. The patient acknowledged the explanation, agreed to proceed with the plan and consent was signed. Patient is being admitted for inpatient treatment for surgery, pain control, PT, OT, prophylactic antibiotics, VTE prophylaxis, progressive ambulation and ADL's and discharge planning. The patient is planning to be discharged to skilled nursing facility versus home.    Anastasio Auerbach Cleta Heatley   PAC  05/06/2012, 9:30 AM

## 2012-05-09 MED ORDER — CLINDAMYCIN PHOSPHATE 900 MG/50ML IV SOLN
900.0000 mg | INTRAVENOUS | Status: AC
  Administered 2012-05-10: 900 mg via INTRAVENOUS
  Filled 2012-05-09: qty 50

## 2012-05-10 ENCOUNTER — Encounter (HOSPITAL_COMMUNITY)
Admission: RE | Disposition: A | Discharge status: Skilled Nursing Facility | Payer: Self-pay | Source: Ambulatory Visit | Attending: Orthopedic Surgery

## 2012-05-10 ENCOUNTER — Inpatient Hospital Stay (HOSPITAL_COMMUNITY): Payer: Medicare Other | Admitting: Anesthesiology

## 2012-05-10 ENCOUNTER — Encounter (HOSPITAL_COMMUNITY): Payer: Self-pay | Admitting: Anesthesiology

## 2012-05-10 ENCOUNTER — Encounter (HOSPITAL_COMMUNITY): Payer: Self-pay | Admitting: *Deleted

## 2012-05-10 ENCOUNTER — Inpatient Hospital Stay (HOSPITAL_COMMUNITY)
Admission: RE | Admit: 2012-05-10 | Discharge: 2012-05-13 | DRG: 470 | Disposition: A | Discharge status: Skilled Nursing Facility | Payer: Medicare Other | Source: Ambulatory Visit | Attending: Orthopedic Surgery | Admitting: Orthopedic Surgery

## 2012-05-10 DIAGNOSIS — Z471 Aftercare following joint replacement surgery: Secondary | ICD-10-CM | POA: Diagnosis not present

## 2012-05-10 DIAGNOSIS — Z6841 Body Mass Index (BMI) 40.0 and over, adult: Secondary | ICD-10-CM

## 2012-05-10 DIAGNOSIS — M1710 Unilateral primary osteoarthritis, unspecified knee: Principal | ICD-10-CM | POA: Diagnosis present

## 2012-05-10 DIAGNOSIS — F3289 Other specified depressive episodes: Secondary | ICD-10-CM | POA: Diagnosis present

## 2012-05-10 DIAGNOSIS — M6281 Muscle weakness (generalized): Secondary | ICD-10-CM | POA: Diagnosis not present

## 2012-05-10 DIAGNOSIS — Z01812 Encounter for preprocedural laboratory examination: Secondary | ICD-10-CM

## 2012-05-10 DIAGNOSIS — F411 Generalized anxiety disorder: Secondary | ICD-10-CM | POA: Diagnosis present

## 2012-05-10 DIAGNOSIS — E119 Type 2 diabetes mellitus without complications: Secondary | ICD-10-CM | POA: Diagnosis not present

## 2012-05-10 DIAGNOSIS — I252 Old myocardial infarction: Secondary | ICD-10-CM | POA: Diagnosis not present

## 2012-05-10 DIAGNOSIS — R0602 Shortness of breath: Secondary | ICD-10-CM | POA: Diagnosis not present

## 2012-05-10 DIAGNOSIS — S8990XA Unspecified injury of unspecified lower leg, initial encounter: Secondary | ICD-10-CM | POA: Diagnosis not present

## 2012-05-10 DIAGNOSIS — I2510 Atherosclerotic heart disease of native coronary artery without angina pectoris: Secondary | ICD-10-CM | POA: Diagnosis present

## 2012-05-10 DIAGNOSIS — R262 Difficulty in walking, not elsewhere classified: Secondary | ICD-10-CM | POA: Diagnosis not present

## 2012-05-10 DIAGNOSIS — S99929A Unspecified injury of unspecified foot, initial encounter: Secondary | ICD-10-CM | POA: Diagnosis not present

## 2012-05-10 DIAGNOSIS — I10 Essential (primary) hypertension: Secondary | ICD-10-CM | POA: Diagnosis not present

## 2012-05-10 DIAGNOSIS — F329 Major depressive disorder, single episode, unspecified: Secondary | ICD-10-CM | POA: Diagnosis present

## 2012-05-10 DIAGNOSIS — E876 Hypokalemia: Secondary | ICD-10-CM | POA: Diagnosis not present

## 2012-05-10 DIAGNOSIS — IMO0002 Reserved for concepts with insufficient information to code with codable children: Secondary | ICD-10-CM | POA: Diagnosis not present

## 2012-05-10 DIAGNOSIS — D62 Acute posthemorrhagic anemia: Secondary | ICD-10-CM | POA: Diagnosis not present

## 2012-05-10 DIAGNOSIS — K219 Gastro-esophageal reflux disease without esophagitis: Secondary | ICD-10-CM | POA: Diagnosis not present

## 2012-05-10 DIAGNOSIS — I509 Heart failure, unspecified: Secondary | ICD-10-CM | POA: Diagnosis not present

## 2012-05-10 DIAGNOSIS — M25569 Pain in unspecified knee: Secondary | ICD-10-CM | POA: Diagnosis not present

## 2012-05-10 DIAGNOSIS — Z96659 Presence of unspecified artificial knee joint: Secondary | ICD-10-CM

## 2012-05-10 DIAGNOSIS — E785 Hyperlipidemia, unspecified: Secondary | ICD-10-CM | POA: Diagnosis not present

## 2012-05-10 HISTORY — PX: TOTAL KNEE ARTHROPLASTY: SHX125

## 2012-05-10 LAB — GLUCOSE, CAPILLARY
Glucose-Capillary: 104 mg/dL — ABNORMAL HIGH (ref 70–99)
Glucose-Capillary: 108 mg/dL — ABNORMAL HIGH (ref 70–99)
Glucose-Capillary: 117 mg/dL — ABNORMAL HIGH (ref 70–99)
Glucose-Capillary: 98 mg/dL (ref 70–99)

## 2012-05-10 LAB — TYPE AND SCREEN
ABO/RH(D): AB POS
Antibody Screen: NEGATIVE

## 2012-05-10 SURGERY — ARTHROPLASTY, KNEE, TOTAL
Anesthesia: Spinal | Site: Knee | Laterality: Right | Wound class: Clean

## 2012-05-10 MED ORDER — PROPOFOL 10 MG/ML IV EMUL
INTRAVENOUS | Status: DC | PRN
  Administered 2012-05-10: 50 ug/kg/min via INTRAVENOUS

## 2012-05-10 MED ORDER — POTASSIUM CHLORIDE 2 MEQ/ML IV SOLN
INTRAVENOUS | Status: AC
  Administered 2012-05-10: 17:00:00 via INTRAVENOUS
  Filled 2012-05-10 (×3): qty 1000

## 2012-05-10 MED ORDER — MENTHOL 3 MG MT LOZG: 1.0000 | LOZENGE | OROMUCOSAL | Status: DC | PRN

## 2012-05-10 MED ORDER — METFORMIN HCL ER 500 MG PO TB24: 500.0000 mg | ORAL_TABLET | Freq: Two times a day (BID) | ORAL | Status: DC

## 2012-05-10 MED ORDER — SIMVASTATIN 10 MG PO TABS
10.0000 mg | ORAL_TABLET | Freq: Every day | ORAL | Status: DC
  Administered 2012-05-10 – 2012-05-12 (×3): 10 mg via ORAL
  Filled 2012-05-10 (×4): qty 1

## 2012-05-10 MED ORDER — BISACODYL 10 MG RE SUPP: 10.0000 mg | Freq: Every day | RECTAL | Status: DC | PRN

## 2012-05-10 MED ORDER — LACTATED RINGERS IV SOLN: INTRAVENOUS | Status: DC

## 2012-05-10 MED ORDER — TORSEMIDE 20 MG PO TABS
20.0000 mg | ORAL_TABLET | Freq: Two times a day (BID) | ORAL | Status: DC
  Administered 2012-05-10 – 2012-05-13 (×6): 20 mg via ORAL
  Filled 2012-05-10 (×8): qty 1

## 2012-05-10 MED ORDER — SODIUM CHLORIDE 0.9 % IR SOLN
Status: DC | PRN
  Administered 2012-05-10: 1000 mL

## 2012-05-10 MED ORDER — MEPERIDINE HCL 50 MG/ML IJ SOLN: 6.2500 mg | INTRAMUSCULAR | Status: DC | PRN

## 2012-05-10 MED ORDER — PHENOL 1.4 % MT LIQD: 1.0000 | OROMUCOSAL | Status: DC | PRN

## 2012-05-10 MED ORDER — LEVOTHYROXINE SODIUM 125 MCG PO TABS
125.0000 ug | ORAL_TABLET | Freq: Every day | ORAL | Status: DC
  Administered 2012-05-11 – 2012-05-13 (×3): 125 ug via ORAL
  Filled 2012-05-10 (×4): qty 1

## 2012-05-10 MED ORDER — POLYETHYLENE GLYCOL 3350 17 G PO PACK
17.0000 g | PACK | Freq: Two times a day (BID) | ORAL | Status: DC
  Administered 2012-05-10 – 2012-05-12 (×4): 17 g via ORAL

## 2012-05-10 MED ORDER — ACETAMINOPHEN 10 MG/ML IV SOLN: 1000.0000 mg | Freq: Once | INTRAVENOUS | Status: DC | PRN

## 2012-05-10 MED ORDER — FLEET ENEMA 7-19 GM/118ML RE ENEM: 1.0000 | ENEMA | Freq: Once | RECTAL | Status: AC | PRN

## 2012-05-10 MED ORDER — PAROXETINE HCL ER 25 MG PO TB24
25.0000 mg | ORAL_TABLET | Freq: Two times a day (BID) | ORAL | Status: DC
  Administered 2012-05-10 – 2012-05-13 (×6): 25 mg via ORAL
  Filled 2012-05-10 (×7): qty 1

## 2012-05-10 MED ORDER — ALBUTEROL SULFATE (5 MG/ML) 0.5% IN NEBU: 2.5000 mg | INHALATION_SOLUTION | Freq: Four times a day (QID) | RESPIRATORY_TRACT | Status: DC | PRN

## 2012-05-10 MED ORDER — DOCUSATE SODIUM 100 MG PO CAPS
100.0000 mg | ORAL_CAPSULE | Freq: Two times a day (BID) | ORAL | Status: DC
  Administered 2012-05-10 – 2012-05-13 (×6): 100 mg via ORAL

## 2012-05-10 MED ORDER — DIPHENHYDRAMINE HCL 25 MG PO CAPS: 25.0000 mg | ORAL_CAPSULE | Freq: Four times a day (QID) | ORAL | Status: DC | PRN

## 2012-05-10 MED ORDER — HYDROMORPHONE HCL PF 1 MG/ML IJ SOLN: 0.2500 mg | INTRAMUSCULAR | Status: DC | PRN

## 2012-05-10 MED ORDER — FEBUXOSTAT 40 MG PO TABS
40.0000 mg | ORAL_TABLET | Freq: Every day | ORAL | Status: DC
Start: 2012-05-10 — End: 2012-05-13
  Administered 2012-05-10 – 2012-05-12 (×3): 40 mg via ORAL
  Filled 2012-05-10 (×4): qty 1

## 2012-05-10 MED ORDER — INSULIN ASPART 100 UNIT/ML ~~LOC~~ SOLN
0.0000 [IU] | Freq: Three times a day (TID) | SUBCUTANEOUS | Status: DC
  Administered 2012-05-11: 2 [IU] via SUBCUTANEOUS
  Administered 2012-05-11: 3 [IU] via SUBCUTANEOUS
  Administered 2012-05-12 – 2012-05-13 (×2): 2 [IU] via SUBCUTANEOUS

## 2012-05-10 MED ORDER — 0.9 % SODIUM CHLORIDE (POUR BTL) OPTIME
TOPICAL | Status: DC | PRN
  Administered 2012-05-10: 1000 mL

## 2012-05-10 MED ORDER — CHLORHEXIDINE GLUCONATE 4 % EX LIQD
60.0000 mL | Freq: Once | CUTANEOUS | Status: DC
  Filled 2012-05-10: qty 60

## 2012-05-10 MED ORDER — HYDROCODONE-ACETAMINOPHEN 7.5-325 MG PO TABS
1.0000 | ORAL_TABLET | ORAL | Status: DC
  Administered 2012-05-10 (×2): 1 via ORAL
  Administered 2012-05-11: 2 via ORAL
  Administered 2012-05-11: 1 via ORAL
  Administered 2012-05-11: 2 via ORAL
  Administered 2012-05-11 (×2): 1 via ORAL
  Administered 2012-05-11 – 2012-05-12 (×3): 2 via ORAL
  Administered 2012-05-12: 1 via ORAL
  Filled 2012-05-10 (×4): qty 2
  Filled 2012-05-10: qty 1
  Filled 2012-05-10 (×6): qty 2

## 2012-05-10 MED ORDER — CLONAZEPAM 0.5 MG PO TABS
0.5000 mg | ORAL_TABLET | Freq: Two times a day (BID) | ORAL | Status: DC | PRN
  Administered 2012-05-10 – 2012-05-12 (×2): 0.5 mg via ORAL
  Filled 2012-05-10 (×2): qty 1

## 2012-05-10 MED ORDER — ZOLPIDEM TARTRATE 5 MG PO TABS
5.0000 mg | ORAL_TABLET | Freq: Every evening | ORAL | Status: DC | PRN
  Administered 2012-05-10: 5 mg via ORAL
  Filled 2012-05-10: qty 1

## 2012-05-10 MED ORDER — FERROUS SULFATE 325 (65 FE) MG PO TABS
325.0000 mg | ORAL_TABLET | Freq: Three times a day (TID) | ORAL | Status: DC
  Administered 2012-05-10 – 2012-05-13 (×8): 325 mg via ORAL
  Filled 2012-05-10 (×11): qty 1

## 2012-05-10 MED ORDER — LACTATED RINGERS IV SOLN
INTRAVENOUS | Status: DC | PRN
  Administered 2012-05-10 (×2): via INTRAVENOUS

## 2012-05-10 MED ORDER — BUPIVACAINE IN DEXTROSE 0.75-8.25 % IT SOLN
INTRATHECAL | Status: DC | PRN
  Administered 2012-05-10: 1.6 mL via INTRATHECAL

## 2012-05-10 MED ORDER — METHOCARBAMOL 500 MG PO TABS
500.0000 mg | ORAL_TABLET | Freq: Four times a day (QID) | ORAL | Status: DC | PRN
  Administered 2012-05-11 – 2012-05-13 (×5): 500 mg via ORAL
  Filled 2012-05-10 (×5): qty 1

## 2012-05-10 MED ORDER — ACETAMINOPHEN 10 MG/ML IV SOLN
INTRAVENOUS | Status: DC | PRN
  Administered 2012-05-10: 1000 mg via INTRAVENOUS

## 2012-05-10 MED ORDER — MIDAZOLAM HCL 5 MG/5ML IJ SOLN
INTRAMUSCULAR | Status: DC | PRN
  Administered 2012-05-10: 2 mg via INTRAVENOUS

## 2012-05-10 MED ORDER — ALUM & MAG HYDROXIDE-SIMETH 200-200-20 MG/5ML PO SUSP: 30.0000 mL | ORAL | Status: DC | PRN

## 2012-05-10 MED ORDER — RIVAROXABAN 10 MG PO TABS
10.0000 mg | ORAL_TABLET | ORAL | Status: DC
  Administered 2012-05-11 – 2012-05-13 (×3): 10 mg via ORAL
  Filled 2012-05-10 (×4): qty 1

## 2012-05-10 MED ORDER — ONDANSETRON HCL 4 MG/2ML IJ SOLN: 4.0000 mg | Freq: Four times a day (QID) | INTRAMUSCULAR | Status: DC | PRN

## 2012-05-10 MED ORDER — CLINDAMYCIN PHOSPHATE 600 MG/50ML IV SOLN
600.0000 mg | Freq: Four times a day (QID) | INTRAVENOUS | Status: AC
  Administered 2012-05-10 – 2012-05-11 (×2): 600 mg via INTRAVENOUS
  Filled 2012-05-10 (×2): qty 50

## 2012-05-10 MED ORDER — HYDROMORPHONE HCL PF 1 MG/ML IJ SOLN
0.5000 mg | INTRAMUSCULAR | Status: DC | PRN
  Administered 2012-05-10 – 2012-05-11 (×2): 1 mg via INTRAVENOUS
  Filled 2012-05-10 (×2): qty 1

## 2012-05-10 MED ORDER — ONDANSETRON HCL 4 MG PO TABS: 4.0000 mg | ORAL_TABLET | Freq: Four times a day (QID) | ORAL | Status: DC | PRN

## 2012-05-10 MED ORDER — ALBUTEROL SULFATE HFA 108 (90 BASE) MCG/ACT IN AERS: 2.0000 | INHALATION_SPRAY | Freq: Four times a day (QID) | RESPIRATORY_TRACT | Status: DC | PRN

## 2012-05-10 MED ORDER — BUPIVACAINE-EPINEPHRINE PF 0.25-1:200000 % IJ SOLN
INTRAMUSCULAR | Status: DC | PRN
  Administered 2012-05-10: 50 mL

## 2012-05-10 MED ORDER — CARVEDILOL 3.125 MG PO TABS
3.1250 mg | ORAL_TABLET | Freq: Two times a day (BID) | ORAL | Status: DC
Start: 2012-05-10 — End: 2012-05-13
  Administered 2012-05-10 – 2012-05-13 (×6): 3.125 mg via ORAL
  Filled 2012-05-10 (×8): qty 1

## 2012-05-10 MED ORDER — GABAPENTIN 300 MG PO CAPS
300.0000 mg | ORAL_CAPSULE | Freq: Two times a day (BID) | ORAL | Status: DC
  Administered 2012-05-10 – 2012-05-11 (×3): 300 mg via ORAL
  Filled 2012-05-10 (×5): qty 1

## 2012-05-10 MED ORDER — METHOCARBAMOL 100 MG/ML IJ SOLN
500.0000 mg | Freq: Four times a day (QID) | INTRAVENOUS | Status: DC | PRN
  Administered 2012-05-10: 500 mg via INTRAVENOUS
  Filled 2012-05-10: qty 5

## 2012-05-10 MED ORDER — BUPROPION HCL ER (XL) 300 MG PO TB24
300.0000 mg | ORAL_TABLET | Freq: Every morning | ORAL | Status: DC
  Administered 2012-05-11 – 2012-05-13 (×3): 300 mg via ORAL
  Filled 2012-05-10 (×3): qty 1

## 2012-05-10 MED ORDER — FENTANYL CITRATE 0.05 MG/ML IJ SOLN
INTRAMUSCULAR | Status: DC | PRN
  Administered 2012-05-10: 100 ug via INTRAVENOUS

## 2012-05-10 MED ORDER — ARIPIPRAZOLE 2 MG PO TABS
2.0000 mg | ORAL_TABLET | Freq: Every morning | ORAL | Status: DC
  Administered 2012-05-11 – 2012-05-13 (×3): 2 mg via ORAL
  Filled 2012-05-10 (×3): qty 1

## 2012-05-10 MED ORDER — KETOROLAC TROMETHAMINE 30 MG/ML IJ SOLN
INTRAMUSCULAR | Status: DC | PRN
  Administered 2012-05-10: 30 mg via INTRAMUSCULAR

## 2012-05-10 MED ORDER — PROMETHAZINE HCL 25 MG/ML IJ SOLN: 6.2500 mg | INTRAMUSCULAR | Status: DC | PRN

## 2012-05-10 MED ORDER — MOMETASONE FURO-FORMOTEROL FUM 100-5 MCG/ACT IN AERO
2.0000 | INHALATION_SPRAY | Freq: Two times a day (BID) | RESPIRATORY_TRACT | Status: DC
  Administered 2012-05-10 – 2012-05-13 (×6): 2 via RESPIRATORY_TRACT
  Filled 2012-05-10: qty 8.8

## 2012-05-10 MED ORDER — POTASSIUM CHLORIDE CRYS ER 20 MEQ PO TBCR
20.0000 meq | EXTENDED_RELEASE_TABLET | Freq: Every day | ORAL | Status: DC
  Administered 2012-05-10 – 2012-05-13 (×4): 20 meq via ORAL
  Filled 2012-05-10 (×4): qty 1

## 2012-05-10 SURGICAL SUPPLY — 61 items
ADH SKN CLS APL DERMABOND .7 (GAUZE/BANDAGES/DRESSINGS) ×1
BAG ZIPLOCK 12X15 (MISCELLANEOUS) ×2 IMPLANT
BANDAGE ELASTIC 6 VELCRO ST LF (GAUZE/BANDAGES/DRESSINGS) ×2 IMPLANT
BANDAGE ESMARK 6X9 LF (GAUZE/BANDAGES/DRESSINGS) ×1 IMPLANT
BLADE SAW SGTL 13.0X1.19X90.0M (BLADE) ×2 IMPLANT
BNDG ESMARK 6X9 LF (GAUZE/BANDAGES/DRESSINGS) ×2
BONE CEMENT GENTAMICIN (Cement) ×4 IMPLANT
BOWL SMART MIX CTS (DISPOSABLE) ×2 IMPLANT
CEMENT BONE GENTAMICIN 40 (Cement) ×2 IMPLANT
CHLORAPREP W/TINT 26ML (MISCELLANEOUS) ×4 IMPLANT
CLOTH BEACON ORANGE TIMEOUT ST (SAFETY) ×2 IMPLANT
CUFF TOURN SGL QUICK 34 (TOURNIQUET CUFF) ×1
CUFF TRNQT CYL 34X4X40X1 (TOURNIQUET CUFF) ×1 IMPLANT
DECANTER SPIKE VIAL GLASS SM (MISCELLANEOUS) ×2 IMPLANT
DERMABOND ADVANCED (GAUZE/BANDAGES/DRESSINGS) ×1
DERMABOND ADVANCED .7 DNX12 (GAUZE/BANDAGES/DRESSINGS) ×1 IMPLANT
DRAPE EXTREMITY T 121X128X90 (DRAPE) ×2 IMPLANT
DRAPE INCISE 23X17 IOBAN STRL (DRAPES) ×1
DRAPE INCISE IOBAN 23X17 STRL (DRAPES) ×1 IMPLANT
DRAPE POUCH INSTRU U-SHP 10X18 (DRAPES) ×2 IMPLANT
DRAPE U-SHAPE 47X51 STRL (DRAPES) ×2 IMPLANT
DRSG AQUACEL AG ADV 3.5X10 (GAUZE/BANDAGES/DRESSINGS) IMPLANT
DRSG AQUACEL AG ADV 3.5X14 (GAUZE/BANDAGES/DRESSINGS) ×2 IMPLANT
DRSG TEGADERM 4X4.75 (GAUZE/BANDAGES/DRESSINGS) ×2 IMPLANT
ELECT REM PT RETURN 9FT ADLT (ELECTROSURGICAL) ×2
ELECTRODE REM PT RTRN 9FT ADLT (ELECTROSURGICAL) ×1 IMPLANT
EVACUATOR 1/8 PVC DRAIN (DRAIN) ×2 IMPLANT
FACESHIELD LNG OPTICON STERILE (SAFETY) ×10 IMPLANT
GAUZE SPONGE 2X2 8PLY STRL LF (GAUZE/BANDAGES/DRESSINGS) ×1 IMPLANT
GLOVE BIOGEL PI IND STRL 7.5 (GLOVE) ×1 IMPLANT
GLOVE BIOGEL PI IND STRL 8 (GLOVE) ×1 IMPLANT
GLOVE BIOGEL PI INDICATOR 7.5 (GLOVE) ×1
GLOVE BIOGEL PI INDICATOR 8 (GLOVE) ×1
GLOVE ECLIPSE 8.0 STRL XLNG CF (GLOVE) ×2 IMPLANT
GLOVE ORTHO TXT STRL SZ7.5 (GLOVE) ×4 IMPLANT
GOWN BRE IMP PREV XXLGXLNG (GOWN DISPOSABLE) ×4 IMPLANT
GOWN STRL NON-REIN LRG LVL3 (GOWN DISPOSABLE) ×2 IMPLANT
HANDPIECE INTERPULSE COAX TIP (DISPOSABLE) ×1
IMMOBILIZER KNEE 20 (SOFTGOODS)
IMMOBILIZER KNEE 20 THIGH 36 (SOFTGOODS) IMPLANT
IMMOBILIZER KNEE 22 UNIV (SOFTGOODS) ×2 IMPLANT
KIT BASIN OR (CUSTOM PROCEDURE TRAY) ×2 IMPLANT
MANIFOLD NEPTUNE II (INSTRUMENTS) ×2 IMPLANT
NDL SAFETY ECLIPSE 18X1.5 (NEEDLE) ×1 IMPLANT
NEEDLE HYPO 18GX1.5 SHARP (NEEDLE) ×2
NS IRRIG 1000ML POUR BTL (IV SOLUTION) ×4 IMPLANT
PACK TOTAL JOINT (CUSTOM PROCEDURE TRAY) ×2 IMPLANT
POSITIONER SURGICAL ARM (MISCELLANEOUS) ×2 IMPLANT
SET HNDPC FAN SPRY TIP SCT (DISPOSABLE) ×1 IMPLANT
SET PAD KNEE POSITIONER (MISCELLANEOUS) ×2 IMPLANT
SPONGE GAUZE 2X2 STER 10/PKG (GAUZE/BANDAGES/DRESSINGS) ×1
SUCTION FRAZIER 12FR DISP (SUCTIONS) ×2 IMPLANT
SUT MNCRL AB 4-0 PS2 18 (SUTURE) ×2 IMPLANT
SUT VIC AB 1 CT1 36 (SUTURE) ×6 IMPLANT
SUT VIC AB 2-0 CT1 27 (SUTURE) ×6
SUT VIC AB 2-0 CT1 TAPERPNT 27 (SUTURE) ×3 IMPLANT
SYR 50ML LL SCALE MARK (SYRINGE) ×2 IMPLANT
TOWEL OR 17X26 10 PK STRL BLUE (TOWEL DISPOSABLE) ×4 IMPLANT
TRAY FOLEY CATH 14FRSI W/METER (CATHETERS) ×2 IMPLANT
WATER STERILE IRR 1500ML POUR (IV SOLUTION) ×2 IMPLANT
WRAP KNEE MAXI GEL POST OP (GAUZE/BANDAGES/DRESSINGS) ×2 IMPLANT

## 2012-05-10 NOTE — Transfer of Care (Signed)
Immediate Anesthesia Transfer of Care Note  Patient: Rhonda [name]  Procedure(s) Performed: Procedure(s) (LRB) with comments: TOTAL KNEE ARTHROPLASTY (Right)  Patient Location: PACU  Anesthesia Type:Regional  Level of Consciousness: awake, alert  and oriented  Airway & Oxygen Therapy: Patient Spontanous Breathing and Patient connected to face mask oxygen  Post-op Assessment: Report given to PACU RN and Post -op Vital signs reviewed and stable  Post vital signs: Reviewed and stable  Complications: No apparent anesthesia complications

## 2012-05-10 NOTE — Interval H&P Note (Signed)
History and Physical Interval Note:  05/10/2012 11:05 AM  Rhonda [name]  has presented today for surgery, with the diagnosis of RIGHT KNEE Osteoarthritis  The various methods of treatment have been discussed with the patient and family. After consideration of risks, benefits and other options for treatment, the patient has consented to  Procedure(s) (LRB) with comments: TOTAL KNEE ARTHROPLASTY (Right) as a surgical intervention .  The patient's history has been reviewed, patient examined, no change in status, stable for surgery.  I have reviewed the patient's chart and labs.  Questions were answered to the patient's satisfaction.     Shelda Pal

## 2012-05-10 NOTE — Anesthesia Procedure Notes (Signed)
Spinal  Patient location during procedure: OR End time: 05/10/2012 12:19 PM Staffing CRNA/Resident: Enriqueta Shutter Performed by: resident/CRNA  Preanesthetic Checklist Completed: patient identified, site marked, surgical consent, pre-op evaluation, timeout performed, IV checked, risks and benefits discussed and monitors and equipment checked Spinal Block Patient position: sitting Prep: Betadine Patient monitoring: heart rate, continuous pulse ox and blood pressure Approach: midline Location: L3-4 Injection technique: single-shot Needle Needle type: Spinocan  Needle gauge: 22 G Needle length: 9 cm Assessment Sensory level: T6 Additional Notes Expiration date of kit checked and confirmed. Patient tolerated procedure well, without complications.

## 2012-05-10 NOTE — Anesthesia Preprocedure Evaluation (Addendum)
Anesthesia Evaluation  Patient identified by MRN, date of birth, ID band Patient awake    Reviewed: Allergy & Precautions, H&P , NPO status , Patient's Chart, lab work & pertinent test results, reviewed documented beta blocker date and time   Airway Mallampati: I TM Distance: >3 FB Neck ROM: Full   Comment: Cleft palate  Dental  (+) Dental Advisory Given and Teeth Intact   Pulmonary shortness of breath and with exertion, asthma ,  breath sounds clear to auscultation  Pulmonary exam normal       Cardiovascular hypertension, Pt. on medications and Pt. on home beta blockers +CHF - CAD and - Past MI - dysrhythmias + Valvular Problems/Murmurs MR Rhythm:Regular Rate:Normal  LBBB  No CAD on cath in 2012  Echo 2012:  Procedure narrative: Transthoracic echocardiography. Image quality  was suboptimal. The study was technically difficult, as a result  of body habitus. - Left ventricle: The cavity size was normal. Systolic function was  low normal to mildly reduced. The estimated ejection fraction was  in the range of 45% to 50%. Features are consistent with a  pseudonormal left ventricular filling pattern, with concomitant  abnormal relaxation and increased filling pressure (grade 2   diastolic dysfunction). Doppler parameters are consistent with  elevated ventricular end-diastolic filling pressure. - Ventricular septum: Septal motion showed abnormal function. These  changes are consistent with intraventricular conduction delay. - Mitral valve: Mild regurgitation.    Neuro/Psych  Headaches, PSYCHIATRIC DISORDERS Anxiety Depression Peripheral neuropathy  Neuromuscular disease    GI/Hepatic Neg liver ROS, GERD-  Medicated,  Endo/Other  diabetes, Type 2, Oral Hypoglycemic AgentsHypothyroidism   Renal/GU negative Renal ROS     Musculoskeletal negative musculoskeletal ROS (+)   Abdominal (+) + obese,   Peds  Hematology negative  hematology ROS (+)   Anesthesia Other Findings   Reproductive/Obstetrics                          Anesthesia Physical Anesthesia Plan  ASA: III  Anesthesia Plan: Spinal   Post-op Pain Management:    Induction:   Airway Management Planned: Simple Face Mask  Additional Equipment:   Intra-op Plan:   Post-operative Plan:   Informed Consent: I have reviewed the patients History and Physical, chart, labs and discussed the procedure including the risks, benefits and alternatives for the proposed anesthesia with the patient or authorized representative who has indicated his/her understanding and acceptance.   Dental advisory given  Plan Discussed with: CRNA  Anesthesia Plan Comments:         Anesthesia Quick Evaluation

## 2012-05-10 NOTE — Progress Notes (Signed)
PHARMACIST - PHYSICIAN COMMUNICATION DR:  Charlann Boxer CONCERNING:  METFORMIN SAFE ADMINISTRATION POLICY  RECOMMENDATION: Metformin has been placed on DISCONTINUE (rejected order) STATUS and should be reordered only after any of the conditions below are ruled out.  Current safety recommendations include avoiding metformin for a minimum of 48 hours after the patient's exposure to intravenous contrast media.  DESCRIPTION:  The Pharmacy Committee has adopted a policy that restricts the use of metformin in hospitalized patients until all the contraindications to administration have been ruled out. Specific contraindications are: []  Serum creatinine ? 1.5 for males [x]  Serum creatinine ? 1.4 for females []  Shock, acute MI, sepsis, hypoxemia, dehydration []  Planned administration of intravenous iodinated contrast media []  Heart Failure patients with low EF []  Acute or chronic metabolic acidosis (including DKA)      Juliette Alcide, PharmD, BCPS.   Pager: [phone] 05/10/2012 4:02 PM

## 2012-05-10 NOTE — Op Note (Signed)
NAME:  Rhonda [name]                      MEDICAL RECORD NO.:  [id number]                             FACILITY:  [facility]      PHYSICIAN:  Madlyn Frankel. Charlann Boxer, M.D.  DATE OF BIRTH:  22-Jul-1950      DATE OF PROCEDURE:  05/10/2012                                     OPERATIVE REPORT         PREOPERATIVE DIAGNOSIS:  Right knee osteoarthritis.      POSTOPERATIVE DIAGNOSIS:  Right knee osteoarthritis.      FINDINGS:  The patient was noted to have complete loss of cartilage and   bone-on-bone arthritis with associated osteophytes in the medial and patellofemoral compartments of   the knee with a significant synovitis and associated effusion.      PROCEDURE:  Right total knee replacement.      COMPONENTS USED:  DePuy rotating platform posterior stabilized knee   system, a size 3 femur, 3 tibia, 12.5 mm PS insert, and 38 patellar   button.      SURGEON:  Madlyn Frankel. Charlann Boxer, M.D.      ASSISTANT:  Lanney Gins, PA-C.      ANESTHESIA:  Spinal.      SPECIMENS:  None.      COMPLICATION:  None.      DRAINS:  One Hemovac.  EBL: <100cc      TOURNIQUET TIME:   Total Tourniquet Time Documented: Thigh (Right) - 48 minutes .      The patient was stable to the recovery room.      INDICATION FOR PROCEDURE:  [name] is a 61 y.o. female patient of   mine.  The patient had been seen, evaluated, and treated conservatively in the   office with medication, activity modification, and injections.  The patient had   radiographic changes of bone-on-bone arthritis with endplate sclerosis and osteophytes noted.      The patient failed conservative measures including medication, injections, and activity modification, and at this point was ready for more definitive measures.   Based on the radiographic changes and failed conservative measures, the patient   decided to proceed with total knee replacement.  Risks of infection,   DVT, component failure, need for revision surgery, postop course, and   expectations were all   discussed and reviewed.  Consent was obtained for benefit of pain   relief.      PROCEDURE IN DETAIL:  The patient was brought to the operative theater.   Once adequate anesthesia, preoperative antibiotics, 2 gm of Ancef administered, the patient was positioned supine with the right thigh tourniquet placed.  The  right lower extremity was prepped and draped in sterile fashion.  A time-   out was performed identifying the patient, planned procedure, and   extremity.      The right lower extremity was placed in the [facility] leg holder.  The leg was   exsanguinated, tourniquet elevated to 250 mmHg.  A midline incision was   made followed by median parapatellar arthrotomy.  Following initial   exposure, attention was first directed to the patella.  Precut  measurement was noted to be 22 mm.  I resected down to 14 mm and used a   38 patellar button to restore patellar height as well as cover the cut   surface.      The lug holes were drilled and a metal shim was placed to protect the   patella from retractors and saw blades.      At this point, attention was now directed to the femur.  The femoral   canal was opened with a drill, irrigated to try to prevent fat emboli.  An   intramedullary rod was passed at 3 degrees valgus, 10 mm of bone was   resected off the distal femur.  Following this resection, the tibia was   subluxated anteriorly.  Using the extramedullary guide, 10 mm of bone was resected off   the proximal lateral tibia.  We confirmed the gap would be   stable medially and laterally with a 10 mm insert as well as confirmed   the cut was perpendicular in the coronal plane, checking with an alignment rod.      Once this was done, I sized the femur to be a size 3 in the anterior-   posterior dimension, chose a standard component based on medial and   lateral dimension.  The size 3 rotation block was then pinned in   position anterior referenced using the  C-clamp to set rotation.  The   anterior, posterior, and  chamfer cuts were made without difficulty nor   notching making certain that I was along the anterior cortex to help   with flexion gap stability.      The final box cut was made off the lateral aspect of distal femur.      At this point, the tibia was sized to be a size 3, the size 3 tray was   then pinned in position through the medial third of the tubercle,   drilled, and keel punched.  Trial reduction was now carried with a 3 femur,  3 tibia, a 12.5 mm insert, and the 38 patella botton.  The knee was brought to   extension, full extension with good flexion stability with the patella   tracking through the trochlea without application of pressure.  Given   all these findings, the trial components removed.  Final components were   opened and cement was mixed.  The knee was irrigated with normal saline   solution and pulse lavage.  The synovial lining was   then injected with 0.25% Marcaine with epinephrine and 1 cc of Toradol,   total of 61 cc.      The knee was irrigated.  Final implants were then cemented onto clean and   dried cut surfaces of bone with the knee brought to extension with a 12.5 mm trial insert.      Once the cement had fully cured, the excess cement was removed   throughout the knee.  I confirmed I was satisfied with the range of   motion and stability, and the final 12.5 mm PS insert was chosen.  It was   placed into the knee.      The tourniquet had been let down at 47 minutes.  No significant   hemostasis required.  The medium Hemovac drain was placed deep.  The   extensor mechanism was then reapproximated using #1 Vicryl with the knee   in flexion.  The   remaining wound was closed with 2-0 Vicryl and  running 4-0 Monocryl.   The knee was cleaned, dried, dressed sterilely using Dermabond and   Aquacel dressing.  Drain site dressed separately.  The patient was then   brought to recovery room in stable  condition, tolerating the procedure   well.   Please note that Physician Assistant, Lanney Gins, PA-C, was present for the entirety of the case, and was utilized for pre-operative positioning, peri-operative retractor management, general facilitation of the procedure.  He was also utilized for primary wound closure at the end of the case.              Madlyn Frankel Charlann Boxer, M.D.

## 2012-05-10 NOTE — Anesthesia Postprocedure Evaluation (Signed)
Anesthesia Post Note  Patient: Rhonda [name]  Procedure(s) Performed: Procedure(s) (LRB): TOTAL KNEE ARTHROPLASTY (Right)  Anesthesia type: Spinal  Patient location: PACU  Post pain: Pain level controlled  Post assessment: Post-op Vital signs reviewed  Last Vitals: BP 128/68  Pulse 71  Temp 36.7 C (Oral)  Resp 13  SpO2 100%  Post vital signs: Reviewed  Level of consciousness: sedated  Complications: No apparent anesthesia complications

## 2012-05-11 ENCOUNTER — Encounter (HOSPITAL_COMMUNITY): Payer: Self-pay | Admitting: Orthopedic Surgery

## 2012-05-11 LAB — GLUCOSE, CAPILLARY
Glucose-Capillary: 164 mg/dL — ABNORMAL HIGH (ref 70–99)
Glucose-Capillary: 111 mg/dL — ABNORMAL HIGH (ref 70–99)
Glucose-Capillary: 139 mg/dL — ABNORMAL HIGH (ref 70–99)
Glucose-Capillary: 111 mg/dL — ABNORMAL HIGH (ref 70–99)

## 2012-05-11 LAB — BASIC METABOLIC PANEL
BUN: 27 mg/dL — ABNORMAL HIGH (ref 6–23)
CO2: 32 mEq/L (ref 19–32)
Calcium: 7.9 mg/dL — ABNORMAL LOW (ref 8.4–10.5)
Chloride: 99 mEq/L (ref 96–112)
Creatinine, Ser: 1.38 mg/dL — ABNORMAL HIGH (ref 0.50–1.10)
GFR calc Af Amer: 47 mL/min — ABNORMAL LOW (ref >90)
GFR calc non Af Amer: 41 mL/min — ABNORMAL LOW (ref >90)
Glucose, Bld: 125 mg/dL — ABNORMAL HIGH (ref 70–99)
Potassium: 3.1 mEq/L — ABNORMAL LOW (ref 3.5–5.1)
Sodium: 139 mEq/L (ref 135–145)

## 2012-05-11 LAB — CBC
HCT: 29.2 % — ABNORMAL LOW (ref 36.0–46.0)
Hemoglobin: 9.6 g/dL — ABNORMAL LOW (ref 12.0–15.0)
MCH: 32.1 pg (ref 26.0–34.0)
MCHC: 32.9 g/dL (ref 30.0–36.0)
MCV: 97.7 fL (ref 78.0–100.0)
Platelets: 273 10*3/uL (ref 150–400)
RBC: 2.99 MIL/uL — ABNORMAL LOW (ref 3.87–5.11)
RDW: 15.4 % (ref 11.5–15.5)
WBC: 7.8 10*3/uL (ref 4.0–10.5)

## 2012-05-11 NOTE — Progress Notes (Signed)
   Subjective: 1 Day Post-Op Procedure(s) (LRB): TOTAL KNEE ARTHROPLASTY (Right)   Patient reports pain as mild, pain well controlled. No events throughout the night.   Objective:   VITALS:   Filed Vitals:   05/11/12   BP: 110/82  Pulse: 80  Temp: 97.6 F (36.4 C)   Resp: 16    Neurovascular intact Dorsiflexion/Plantar flexion intact Incision: dressing C/D/I No cellulitis present Compartment soft  LABS  Basename 05/11/12 0457  HGB 9.6*  HCT 29.2*  WBC 7.8  PLT 273     Basename 05/11/12 0457  NA 139  K 3.1*  BUN 27*  CREATININE 1.38*  GLUCOSE 125*     Assessment/Plan: 1 Day Post-Op Procedure(s) (LRB): TOTAL KNEE ARTHROPLASTY (Right) HV drain d/c'ed Foley cath d/c'ed Advance diet Up with therapy D/C IV fluids Discharge to SNF eventually, when ready.  Expected ABLA  Treated with iron and will observe  Morbid Obesity (BMI >40)  Estimated Body mass index is 48.52 kg/(m^2) as calculated from the following:   Height as of 01/14/12: 5\' 2" (1.575 m).   Weight as of 01/14/12: 265 lb 4.8 oz(120.339 kg). Patient also counseled that weight may inhibit the healing process Patient counseled that losing weight will help with future health issues  Hypokalemia Treated with oral potassium and will observe      Anastasio Auerbach. Shelbee Apgar   PAC  05/11/2012, 9:29 AM

## 2012-05-11 NOTE — Progress Notes (Signed)
Clinical Social Work Department BRIEF PSYCHOSOCIAL ASSESSMENT 05/11/2012  Patient:  Rhonda [name], Rhonda [name]     Account Number:  [id number]     Admit date:  05/10/2012  Clinical Social Worker:  Candie Chroman  Date/Time:  05/11/2012 12:10 PM  Referred by:  Physician  Date Referred:  05/11/2012 Referred for  SNF Placement   Other Referral:   Interview type:  Patient Other interview type:    PSYCHOSOCIAL DATA Living Status:  FAMILY Admitted from facility:   Level of care:   Primary support name:  Burnett Corrente Primary support relationship to patient:  SPOUSE Degree of support available:   limiteddue to work schedule    CURRENT CONCERNS Current Concerns  Post-Acute Placement   Other Concerns:    SOCIAL WORK ASSESSMENT / PLAN Pt is a 61 yr old female living at home prior to hospitalization. CSW met with pt to assist with d/c planning. PT is recommending ST SNF placement following hospital d/c. Pt is interested in [facility] where her mother is a resident. SNF search has been initiated and [employer] contacted. [facility] is able to offer a ST rehab bed for this pt when ready for d/c.   Assessment/plan status:  Psychosocial Support/Ongoing Assessment of Needs Other assessment/ plan:   Information/referral to community resources:   SNF    PATIENT'S/FAMILY'S RESPONSE TO PLAN OF CARE: Pt agrees with PT recommendation that ST Rehab would be helpful and would like to go to [facility] for SNF placement.

## 2012-05-11 NOTE — Progress Notes (Signed)
Utilization review completed.  

## 2012-05-11 NOTE — Progress Notes (Signed)
Physical Therapy Treatment Note   05/11/12 1400  PT Visit Information  Last PT Received On 05/11/12  Assistance Needed +1  PT Time Calculation  PT Start Time 1356  PT Stop Time 1427  PT Time Calculation (min) 31 min  Subjective Data  Subjective I need to get cleaned up before I get back to bed.  Precautions  Precautions Knee  Required Braces or Orthoses Knee Immobilizer - Right  Knee Immobilizer - Right Discontinue once straight leg raise with < 10 degree lag  Restrictions  RLE Weight Bearing WBAT  Cognition  Overall Cognitive Status Appears within functional limits for tasks assessed/performed  Transfers  Transfers Sit to Stand;Stand to Sit  Sit to Stand 4: Min assist;With upper extremity assist;From chair/3-in-1  Stand to Sit 4: Min guard;With upper extremity assist;To chair/3-in-1  Details for Transfer Assistance verbal cues for hand placement, R LE forward  Ambulation/Gait  Ambulation/Gait Assistance 4: Min assist;4: Min guard  Ambulation Distance (Feet) 26 Feet  Assistive device Rolling walker  Ambulation/Gait Assistance Details verbal cues for step length, sequence, RW distance, pt fatigues quickly and required short standing rest break  Gait Pattern Step-to pattern;Antalgic  Gait velocity decreased  Exercises  Exercises Total Joint  Total Joint Exercises  Ankle Circles/Pumps AROM;15 reps;Both  Quad Sets AROM;Both;15 reps  Short Arc Franne Grip;Right;15 reps  Heel Slides AAROM;Right;15 reps  Hip ABduction/ADduction AROM;15 reps;Right  Straight Leg Raises AAROM;Right;10 reps  PT - End of Session  Equipment Utilized During Treatment Right knee immobilizer  Activity Tolerance Patient limited by fatigue;Patient limited by pain  Patient left in chair;with call bell/phone within reach  PT - Assessment/Plan  Comments on Treatment Session Pt ambulated short distance into hallway and performed exercises in recliner.  Pt plans to d/c to SNF prior to home.  PT Plan Discharge  plan remains appropriate;Frequency remains appropriate  Follow Up Recommendations SNF;Supervision/Assistance - 24 hour  PT equipment None recommended by PT  Acute Rehab PT Goals  PT Goal: Sit to Stand - Progress Progressing toward goal  PT Goal: Stand to Sit - Progress Progressing toward goal  PT Goal: Ambulate - Progress Progressing toward goal  PT Goal: Perform Home Exercise Program - Progress Progressing toward goal  PT General Charges  $$ ACUTE PT VISIT 1 Procedure  PT Treatments  $Gait Training 8-22 mins  $Therapeutic Exercise 8-22 mins    Zenovia Jarred, PT Pager: [phone]

## 2012-05-11 NOTE — Progress Notes (Addendum)
Pt refused to have foley discontinued this morning. Charge nurse Junie Panning aware. Advised to leave in and follow up with dayshift nurse to remove after first therapy.  Pt tolerating clears diet without incident. Able to advance diet this am.

## 2012-05-11 NOTE — Evaluation (Signed)
Physical Therapy Evaluation Patient Details Name: Rhonda [name] MRN: [id number] DOB: Jan 24, 1951 Today's Date: 05/11/2012 Time: 1010-1031 PT Time Calculation (min): 21 min  PT Assessment / Plan / Recommendation Clinical Impression  Pt s/p R TKR.  Pt would benefit from acute PT services in order to improve independence with transfers and ambulation.  Pt slow moving and fatigued quickly on eval and may benefit from ST-SNF upon d/c.  Spouse works 3rd shift and daughter to be home during day.    PT Assessment  Patient needs continued PT services    Follow Up Recommendations  SNF;Supervision/Assistance - 24 hour (may progress to HHPT)    Does the patient have the potential to tolerate intense rehabilitation      Barriers to Discharge        Equipment Recommendations  None recommended by PT    Recommendations for Other Services     Frequency 7X/week    Precautions / Restrictions Precautions Precautions: Knee Required Braces or Orthoses: Knee Immobilizer - Right Knee Immobilizer - Right: Discontinue once straight leg raise with < 10 degree lag Restrictions Weight Bearing Restrictions: No RLE Weight Bearing: Weight bearing as tolerated   Pertinent Vitals/Pain Pt did not rate pain however reported with mobility.  Repositioned and ice applied      Mobility  Bed Mobility Bed Mobility: Supine to Sit Supine to Sit: 4: Min assist;HOB elevated;With rails Details for Bed Mobility Assistance: assist for R LE, use of rails to assist Transfers Transfers: Sit to Stand;Stand to Sit Sit to Stand: 4: Min assist;With upper extremity assist;From bed;From elevated surface Stand to Sit: 4: Min assist;With upper extremity assist;To chair/3-in-1 Details for Transfer Assistance: verbal cues for hand placement, R LE forward, pt did not reach back upon descent Ambulation/Gait Ambulation/Gait Assistance: 4: Min assist Ambulation Distance (Feet): 30 Feet Assistive device: Rolling  walker Ambulation/Gait Assistance Details: max verbal cues for sequence, RW distance, step length, increased time for steps Gait Pattern: Step-to pattern;Antalgic Gait velocity: decreased    Shoulder Instructions     Exercises     PT Diagnosis: Difficulty walking;Acute pain  PT Problem List: Decreased strength;Decreased range of motion;Decreased mobility;Decreased knowledge of precautions;Pain;Decreased activity tolerance;Decreased knowledge of use of DME;Obesity PT Treatment Interventions: DME instruction;Gait training;Functional mobility training;Stair training;Therapeutic activities;Therapeutic exercise;Patient/family education;Other (comment) (stairs if home)   PT Goals Acute Rehab PT Goals PT Goal Formulation: With patient Time For Goal Achievement: 05/18/12 Potential to Achieve Goals: Good Pt will go Supine/Side to Sit: with supervision PT Goal: Supine/Side to Sit - Progress: Goal set today Pt will go Sit to Stand: with supervision PT Goal: Sit to Stand - Progress: Goal set today Pt will go Stand to Sit: with supervision PT Goal: Stand to Sit - Progress: Goal set today Pt will Ambulate: 51 - 150 feet;with supervision;with least restrictive assistive device Pt will Go Up / Down Stairs: 1-2 stairs;with least restrictive assistive device;Other (comment);with supervision (if d/c home) PT Goal: Up/Down Stairs - Progress: Goal set today Pt will Perform Home Exercise Program: with supervision, verbal cues required/provided PT Goal: Perform Home Exercise Program - Progress: Goal set today  Visit Information  Last PT Received On: 05/11/12 Assistance Needed: +1    Subjective Data  Subjective: I'm a little dizzy all the time from the medicine I have to take. (upon sitting)   Prior Functioning  Home Living Lives With: Spouse;Daughter Available Help at Discharge: Family Type of Home: House Home Access: Stairs to enter [employer] of Steps: 2 Entrance Stairs-Rails:  Left Home Layout: One level;Able to live on main level with bedroom/bathroom Home Adaptive Equipment: Straight cane;Walker - rolling;Other (comment) (elevated toilet) Prior Function Level of Independence: Independent with assistive device(s) Communication Communication: No difficulties    Cognition  Overall Cognitive Status: Appears within functional limits for tasks assessed/performed Arousal/Alertness: Awake/alert Orientation Level: Appears intact for tasks assessed Behavior During Session: [facility] for tasks performed    Extremity/Trunk Assessment Right Upper Extremity Assessment RUE ROM/Strength/Tone: [facility] for tasks assessed Left Upper Extremity Assessment LUE ROM/Strength/Tone: WFL for tasks assessed Right Lower Extremity Assessment RLE ROM/Strength/Tone: Deficits RLE ROM/Strength/Tone Deficits: unable to lift against gravity, maintained KI RLE Sensation: History of peripheral neuropathy Left Lower Extremity Assessment LLE ROM/Strength/Tone: WFL for tasks assessed LLE Sensation: History of peripheral neuropathy   Balance    End of Session PT - End of Session Equipment Utilized During Treatment: Right knee immobilizer Activity Tolerance: Patient limited by fatigue;Patient limited by pain Patient left: in chair;with call bell/phone within reach  GP     Caleah Tortorelli,KATHrine E 05/11/2012, 10:58 AM Pager: [phone]

## 2012-05-11 NOTE — Progress Notes (Signed)
Clinical Social Work Department CLINICAL SOCIAL WORK PLACEMENT NOTE 05/11/2012  Patient:  Rhonda [name], Rhonda [name]  Account Number:  [id number] Admit date:  05/10/2012  Clinical Social Worker:  Cori Razor, LCSW  Date/time:  05/11/2012 12:57 PM  Clinical Social Work is seeking post-discharge placement for this patient at the following level of care:   SKILLED NURSING   (*CSW will update this form in Epic as items are completed)   05/11/2012  Patient/family provided with [facility] Department of Clinical Social Work's list of facilities offering this level of care within the geographic area requested by the patient (or if unable, by the patient's family).  05/11/2012  Patient/family informed of their freedom to choose among providers that offer the needed level of care, that participate in Medicare, Medicaid or managed care program needed by the patient, have an available bed and are willing to accept the patient.  05/11/2012  Patient/family informed of [facility]' ownership interest in [facility], as well as of the fact that they are under no obligation to receive care at this facility.  PASARR submitted to EDS on 05/11/2012 PASARR number received from EDS on   FL2 transmitted to all facilities in geographic area requested by pt/family on  05/11/2012 FL2 transmitted to all facilities within larger geographic area on   Patient informed that his/her managed care company has contracts with or will negotiate with  certain facilities, including the following:     Patient/family informed of bed offers received:  05/11/2012 Patient chooses bed at [facility] Physician recommends and patient chooses bed at    Patient to be transferred to [facility] on   Patient to be transferred to facility by   The following physician request were entered in Epic:   Additional Comments:  Cori Razor LCSW [phone]

## 2012-05-12 LAB — BASIC METABOLIC PANEL
BUN: 20 mg/dL (ref 6–23)
CO2: 31 mEq/L (ref 19–32)
Calcium: 8 mg/dL — ABNORMAL LOW (ref 8.4–10.5)
Chloride: 97 mEq/L (ref 96–112)
Creatinine, Ser: 1.26 mg/dL — ABNORMAL HIGH (ref 0.50–1.10)
GFR calc Af Amer: 53 mL/min — ABNORMAL LOW (ref >90)
GFR calc non Af Amer: 46 mL/min — ABNORMAL LOW (ref >90)
Glucose, Bld: 126 mg/dL — ABNORMAL HIGH (ref 70–99)
Potassium: 3.3 mEq/L — ABNORMAL LOW (ref 3.5–5.1)
Sodium: 138 mEq/L (ref 135–145)

## 2012-05-12 LAB — CBC
HCT: 28.1 % — ABNORMAL LOW (ref 36.0–46.0)
Hemoglobin: 9.7 g/dL — ABNORMAL LOW (ref 12.0–15.0)
MCH: 32.8 pg (ref 26.0–34.0)
MCHC: 34.5 g/dL (ref 30.0–36.0)
MCV: 94.9 fL (ref 78.0–100.0)
Platelets: 290 10*3/uL (ref 150–400)
RBC: 2.96 MIL/uL — ABNORMAL LOW (ref 3.87–5.11)
RDW: 15.3 % (ref 11.5–15.5)
WBC: 9.2 10*3/uL (ref 4.0–10.5)

## 2012-05-12 LAB — GLUCOSE, CAPILLARY
Glucose-Capillary: 128 mg/dL — ABNORMAL HIGH (ref 70–99)
Glucose-Capillary: 98 mg/dL (ref 70–99)
Glucose-Capillary: 92 mg/dL (ref 70–99)
Glucose-Capillary: 130 mg/dL — ABNORMAL HIGH (ref 70–99)

## 2012-05-12 MED ORDER — OXYCODONE HCL 5 MG PO TABS
5.0000 mg | ORAL_TABLET | ORAL | Status: DC | PRN
  Administered 2012-05-12 (×2): 10 mg via ORAL
  Administered 2012-05-12 – 2012-05-13 (×4): 15 mg via ORAL
  Administered 2012-05-13: 10 mg via ORAL
  Filled 2012-05-12: qty 2
  Filled 2012-05-12 (×3): qty 3
  Filled 2012-05-12 (×2): qty 2
  Filled 2012-05-12: qty 3
  Filled 2012-05-12: qty 2

## 2012-05-12 MED ORDER — METFORMIN HCL ER 500 MG PO TB24
500.0000 mg | ORAL_TABLET | Freq: Two times a day (BID) | ORAL | Status: DC
  Administered 2012-05-13: 500 mg via ORAL
  Filled 2012-05-12 (×4): qty 1

## 2012-05-12 MED ORDER — GABAPENTIN 300 MG PO CAPS
300.0000 mg | ORAL_CAPSULE | Freq: Three times a day (TID) | ORAL | Status: DC
  Administered 2012-05-12 – 2012-05-13 (×4): 300 mg via ORAL
  Filled 2012-05-12 (×6): qty 1

## 2012-05-12 NOTE — Progress Notes (Signed)
   Subjective: 2 Days Post-Op Procedure(s) (LRB): TOTAL KNEE ARTHROPLASTY (Right)   Patient reports pain as moderate, pain not controlled well on Norco. She states that she has taken Oxycodone previously and done well with pain. No events throughout the night.   Objective:   VITALS:   Filed Vitals:   05/12/12 0648  BP: 138/82  Pulse: 89  Temp: 98.7 F (37.1 C)  Resp: 16    Neurovascular intact Dorsiflexion/Plantar flexion intact Incision: dressing C/D/I No cellulitis present Compartment soft  LABS  Basename 05/12/12 0430 05/11/12 0457  HGB 9.7* 9.6*  HCT 28.1* 29.2*  WBC 9.2 7.8  PLT 290 273     Basename 05/12/12 0430 05/11/12 0457  NA 138 139  K 3.3* 3.1*  BUN 20 27*  CREATININE 1.26* 1.38*  GLUCOSE 126* 125*     Assessment/Plan: 2 Days Post-Op Procedure(s) (LRB): TOTAL KNEE ARTHROPLASTY (Right) Changed from Norco to Oxycodone Up with therapy Discharge to SNF eventually, possible Friday if she is good.  Expected ABLA  Treated with iron and will observe   Morbid Obesity (BMI >40)  Estimated Body mass index is 48.52 kg/(m^2) as calculated from the following:    Height as of 01/14/12: 5\' 2" (1.575 m).    Weight as of 01/14/12: 265 lb 4.8 oz(120.339 kg).  Patient also counseled that weight may inhibit the healing process  Patient counseled that losing weight will help with future health issues   Hypokalemia  Treated with oral potassium and will observe    Anastasio Auerbach. Erial Fikes   PAC  05/12/2012, 8:28 AM

## 2012-05-12 NOTE — Progress Notes (Signed)
CSW assisting with d/c planning. [facility] has [facility] Rehab bed available for this pt tomorrow if she is stable for d/c. CSW will follow to assist with d/c planning to SNF.  Cori Razor LCSW [phone]

## 2012-05-12 NOTE — Progress Notes (Signed)
Physical Therapy Treatment Patient Details Name: Rhonda [name] MRN: [id number] DOB: 01-13-1951 Today's Date: 05/12/2012 Time: 4098-1191 PT Time Calculation (min): 35 min  PT Assessment / Plan / Recommendation Comments on Treatment Session  Pt ambulated in hallway and performed exercises.  Pt reports d/c tomorrow to SNF.    Follow Up Recommendations  SNF;Supervision/Assistance - 24 hour     Does the patient have the potential to tolerate intense rehabilitation     Barriers to Discharge        Equipment Recommendations  None recommended by PT    Recommendations for Other Services    Frequency     Plan Discharge plan remains appropriate;Frequency remains appropriate    Precautions / Restrictions Precautions Precautions: Knee Required Braces or Orthoses: Knee Immobilizer - Right Knee Immobilizer - Right: Discontinue once straight leg raise with < 10 degree lag Restrictions Weight Bearing Restrictions: No RLE Weight Bearing: Weight bearing as tolerated   Pertinent Vitals/Pain 6/10 R knee with exercises, repositioned in recliner, ice packs applied    Mobility  Bed Mobility Supine to Sit: 4: Min assist;HOB elevated;With rails Details for Bed Mobility Assistance: assist for R LE, use of rails to assist  Transfers Transfers: Sit to Stand;Stand to Sit Sit to Stand: 4: Min guard;With upper extremity assist;From chair/3-in-1 Stand to Sit: 4: Min guard;With upper extremity assist;To chair/3-in-1 Details for Transfer Assistance: verbal cues for hand placement, R LE forward  Ambulation/Gait Ambulation/Gait Assistance: 4: Min guard Ambulation Distance (Feet): 40 Feet Assistive device: Rolling walker Ambulation/Gait Assistance Details: increased time for ambulation, verbal cues for RW distance, sequence, step length Gait Pattern: Step-to pattern;Antalgic Gait velocity: decreased    Exercises Total Joint Exercises Ankle Circles/Pumps: AROM;Both;20 reps Quad Sets: AROM;Both;20  reps Short Arc QuadBarbaraann [name];Right;15 reps Heel Slides: AAROM;Right;15 reps Hip ABduction/ADduction: AROM;15 reps;Right Straight Leg Raises: AAROM;Right;10 reps   PT Diagnosis:    PT Problem List:   PT Treatment Interventions:     PT Goals Acute Rehab PT Goals PT Goal: Stand to Sit - Progress: Progressing toward goal PT Goal: Ambulate - Progress: Progressing toward goal PT Goal: Up/Down Stairs - Progress: Discontinued (comment) (going SNF) PT Goal: Perform Home Exercise Program - Progress: Progressing toward goal  Visit Information  Last PT Received On: 05/12/12 Assistance Needed: +1    Subjective Data  Subjective: I don't want to leave tomorrow. You all are so nice.   Cognition  Overall Cognitive Status: Appears within functional limits for tasks assessed/performed Arousal/Alertness: Awake/alert Orientation Level: Appears intact for tasks assessed Behavior During Session: [facility] for tasks performed Cognition - Other Comments: Pt slow to process at times.    Balance     End of Session PT - End of Session Equipment Utilized During Treatment: Right knee immobilizer Activity Tolerance: Patient limited by fatigue;Patient limited by pain Patient left: in chair;with call bell/phone within reach   GP     [facility] E 05/12/2012, 12:43 PM Pager: [phone]

## 2012-05-12 NOTE — Evaluation (Signed)
Occupational Therapy Evaluation Patient Details Name: Rhonda [name] MRN: [id number] DOB: 08-Feb-1951 Today's Date: 05/12/2012 Time: 4098-1191 OT Time Calculation (min): 25 min  OT Assessment / Plan / Recommendation Clinical Impression  61 yo female who presents with a R TKR. Skilled OT recommended to maximize ADL independence to supervision/min A level in prep for d/c to next venue.    OT Assessment  Patient needs continued OT Services    Follow Up Recommendations  SNF    Barriers to Discharge Decreased caregiver support    Equipment Recommendations  3 in 1 bedside comode    Recommendations for Other Services    Frequency  Min 2X/week    Precautions / Restrictions Precautions Precautions: Knee Required Braces or Orthoses: Knee Immobilizer - Right Knee Immobilizer - Right: Discontinue once straight leg raise with < 10 degree lag Restrictions Weight Bearing Restrictions: No RLE Weight Bearing: Weight bearing as tolerated   Pertinent Vitals/Pain Reported 7/10 pain with mobility. Repositioned and cold applied.    ADL  Grooming: Set up Where Assessed - Grooming: Unsupported sitting Upper Body Bathing: Set up Where Assessed - Upper Body Bathing: Unsupported sitting Lower Body Bathing: Moderate assistance Where Assessed - Lower Body Bathing: Supported sit to stand Upper Body Dressing: Set up Where Assessed - Upper Body Dressing: Unsupported sitting Lower Body Dressing: Maximal assistance Where Assessed - Lower Body Dressing: Supported sit to stand Toilet Transfer: Minimal assistance Toilet Transfer Method: Surveyor, minerals: Materials engineer and Hygiene: Moderate assistance Where Assessed - Toileting Clothing Manipulation and Hygiene: Sit to stand from 3-in-1 or toilet Equipment Used: Rolling walker ADL Comments: Pt required MAX cues for safe pivot to 3n1 including hand placement and technique with RW. Requires constant  re-assurance and encouragement during any mobility.    OT Diagnosis: Generalized weakness  OT Problem List: Decreased activity tolerance;Decreased safety awareness;Decreased knowledge of use of DME or AE;Pain OT Treatment Interventions: Self-care/ADL training;Therapeutic activities;DME and/or AE instruction;Patient/family education   OT Goals Acute Rehab OT Goals OT Goal Formulation: With patient Time For Goal Achievement: 05/19/12 Potential to Achieve Goals: Good ADL Goals Pt Will Perform Grooming: with supervision;Standing at sink ADL Goal: Grooming - Progress: Goal set today Pt Will Perform Lower Body Bathing: with supervision;Sit to stand from chair;Sit to stand from bed ADL Goal: Lower Body Bathing - Progress: Goal set today Pt Will Perform Lower Body Dressing: with min assist;Sit to stand from chair;Sit to stand from bed ADL Goal: Lower Body Dressing - Progress: Goal set today Pt Will Transfer to Toilet: with supervision;Ambulation;with DME ADL Goal: Toilet Transfer - Progress: Goal set today Pt Will Perform Toileting - Clothing Manipulation: with supervision;Sitting on 3-in-1 or toilet;Standing ADL Goal: Toileting - Clothing Manipulation - Progress: Goal set today Pt Will Perform Toileting - Hygiene: with supervision;Sit to stand from 3-in-1/toilet ADL Goal: Toileting - Hygiene - Progress: Goal set today  Visit Information  Last OT Received On: 05/12/12 Assistance Needed: +1    Subjective Data  Subjective: I'm afraid they're not going to be able to take care of me as well at [facility] as you people do here... Patient Stated Goal: Get stronger.   Prior Functioning     Home Living Lives With: Spouse;Daughter Available Help at Discharge: Family Type of Home: House Home Access: Stairs to enter Secretary/administrator of Steps: 2 Entrance Stairs-Rails: Left Home Layout: Able to live on main level with bedroom/bathroom Bathroom Toilet: Handicapped height Home Adaptive  Equipment: Straight cane;Walker -  rolling Prior Function Level of Independence: Independent with assistive device(s) Communication Communication: No difficulties Dominant Hand: Right         Vision/Perception     Cognition  Overall Cognitive Status: Appears within functional limits for tasks assessed/performed Arousal/Alertness: Awake/alert Orientation Level: Appears intact for tasks assessed Behavior During Session: [facility] for tasks performed Cognition - Other Comments: Pt slow to process at times.    Extremity/Trunk Assessment Right Upper Extremity Assessment RUE ROM/Strength/Tone: WFL for tasks assessed Left Upper Extremity Assessment LUE ROM/Strength/Tone: WFL for tasks assessed     Mobility Bed Mobility Supine to Sit: 4: Min assist;HOB elevated;With rails Details for Bed Mobility Assistance: assist for R LE, use of rails to assist  Transfers Sit to Stand: 4: Min assist;With upper extremity assist;From bed;From chair/3-in-1 Stand to Sit: 4: Min assist;With upper extremity assist;With armrests;To chair/3-in-1 Details for Transfer Assistance: verbal cues for hand placement, R LE forward      Shoulder Instructions     Exercise     Balance     End of Session OT - End of Session Activity Tolerance: Patient tolerated treatment well Patient left: in chair;with call bell/phone within reach  GO     Jovany Disano A OTR/L [phone] 05/12/2012, 10:34 AM

## 2012-05-13 DIAGNOSIS — I509 Heart failure, unspecified: Secondary | ICD-10-CM | POA: Diagnosis not present

## 2012-05-13 DIAGNOSIS — R262 Difficulty in walking, not elsewhere classified: Secondary | ICD-10-CM | POA: Diagnosis not present

## 2012-05-13 DIAGNOSIS — Z471 Aftercare following joint replacement surgery: Secondary | ICD-10-CM | POA: Diagnosis not present

## 2012-05-13 DIAGNOSIS — S99929A Unspecified injury of unspecified foot, initial encounter: Secondary | ICD-10-CM | POA: Diagnosis not present

## 2012-05-13 DIAGNOSIS — M6281 Muscle weakness (generalized): Secondary | ICD-10-CM | POA: Diagnosis not present

## 2012-05-13 DIAGNOSIS — M25569 Pain in unspecified knee: Secondary | ICD-10-CM | POA: Diagnosis not present

## 2012-05-13 DIAGNOSIS — F3289 Other specified depressive episodes: Secondary | ICD-10-CM | POA: Diagnosis not present

## 2012-05-13 DIAGNOSIS — M1710 Unilateral primary osteoarthritis, unspecified knee: Secondary | ICD-10-CM | POA: Diagnosis not present

## 2012-05-13 DIAGNOSIS — IMO0002 Reserved for concepts with insufficient information to code with codable children: Secondary | ICD-10-CM | POA: Diagnosis not present

## 2012-05-13 DIAGNOSIS — G609 Hereditary and idiopathic neuropathy, unspecified: Secondary | ICD-10-CM | POA: Diagnosis not present

## 2012-05-13 DIAGNOSIS — D509 Iron deficiency anemia, unspecified: Secondary | ICD-10-CM | POA: Diagnosis not present

## 2012-05-13 DIAGNOSIS — E1349 Other specified diabetes mellitus with other diabetic neurological complication: Secondary | ICD-10-CM | POA: Diagnosis not present

## 2012-05-13 DIAGNOSIS — M109 Gout, unspecified: Secondary | ICD-10-CM | POA: Diagnosis not present

## 2012-05-13 DIAGNOSIS — E785 Hyperlipidemia, unspecified: Secondary | ICD-10-CM | POA: Diagnosis not present

## 2012-05-13 DIAGNOSIS — G894 Chronic pain syndrome: Secondary | ICD-10-CM | POA: Diagnosis not present

## 2012-05-13 DIAGNOSIS — K5900 Constipation, unspecified: Secondary | ICD-10-CM | POA: Diagnosis not present

## 2012-05-13 DIAGNOSIS — E119 Type 2 diabetes mellitus without complications: Secondary | ICD-10-CM | POA: Diagnosis not present

## 2012-05-13 DIAGNOSIS — J329 Chronic sinusitis, unspecified: Secondary | ICD-10-CM | POA: Diagnosis not present

## 2012-05-13 DIAGNOSIS — F329 Major depressive disorder, single episode, unspecified: Secondary | ICD-10-CM | POA: Diagnosis not present

## 2012-05-13 DIAGNOSIS — S8990XA Unspecified injury of unspecified lower leg, initial encounter: Secondary | ICD-10-CM | POA: Diagnosis not present

## 2012-05-13 LAB — GLUCOSE, CAPILLARY: Glucose-Capillary: 137 mg/dL — ABNORMAL HIGH (ref 70–99)

## 2012-05-13 MED ORDER — METHOCARBAMOL 500 MG PO TABS: 500.0000 mg | ORAL_TABLET | Freq: Four times a day (QID) | ORAL | Status: DC | PRN

## 2012-05-13 MED ORDER — OXYCODONE HCL 5 MG PO TABS: 5.0000 mg | ORAL_TABLET | ORAL | Status: DC | PRN

## 2012-05-13 MED ORDER — DIPHENHYDRAMINE HCL 25 MG PO CAPS: 25.0000 mg | ORAL_CAPSULE | Freq: Four times a day (QID) | ORAL | Status: DC | PRN

## 2012-05-13 MED ORDER — POLYETHYLENE GLYCOL 3350 17 G PO PACK: 17.0000 g | PACK | Freq: Two times a day (BID) | ORAL | Status: DC

## 2012-05-13 MED ORDER — FERROUS SULFATE 325 (65 FE) MG PO TABS: 325.0000 mg | ORAL_TABLET | Freq: Three times a day (TID) | ORAL | Status: DC

## 2012-05-13 MED ORDER — DSS 100 MG PO CAPS: 100.0000 mg | ORAL_CAPSULE | Freq: Two times a day (BID) | ORAL | Status: DC

## 2012-05-13 NOTE — Discharge Summary (Signed)
Physician Discharge Summary  Patient ID: Rhonda [name] MRN: [id number] DOB/AGE: 06-18-1950 61 y.o.  Admit date: 05/10/2012 Discharge date:  05/13/2012  Procedures:  Procedure(s) (LRB): TOTAL KNEE ARTHROPLASTY (Right)  Attending Physician:  Dr. Durene Romans   Admission Diagnoses:   Right knee OA / pain.  Discharge Diagnoses:  Principal Problem:  *S/P right TKA   HPI: Rhonda [name], 61 y.o. female, has a history of pain and functional disability in the right knee due to arthritis and has failed non-surgical conservative treatments for greater than 12 weeks to includeNSAID's and/or analgesics, corticosteriod injections, weight reduction as appropriate and activity modification. Onset of symptoms was gradual, starting 4 years ago with gradually worsening course since that time. The patient noted no past surgery on the right knee(s). Patient currently rates pain in the right knee(s) at 8 out of 10 with activity. Patient has worsening of pain with activity and weight bearing, pain that interferes with activities of daily living, pain with passive range of motion, crepitus and joint swelling. Patient has evidence of periarticular osteophytes and joint space narrowing by imaging studies. There is no active infection. Risks, benefits and expectations were discussed with the patient. Patient understand the risks, benefits and expectations and wishes to proceed with surgery.   PCP: Elie Confer, MD   Discharged Condition: good  Hospital Course:  Patient underwent the above stated procedure on 05/10/2012. Patient tolerated the procedure well and brought to the recovery room in good condition and subsequently to the floor.  POD #1 BP: 110/82 ; Pulse: 80 ; Temp: 97.6 F (36.4 C) ; Resp: 16  Pt's foley was removed, as well as the hemovac drain removed. IV was changed to a saline lock. Patient reports pain as mild, pain well controlled. No events throughout the night.  Neurovascular  intact, dorsiflexion/plantar flexion intact, incision: dressing C/D/I, no cellulitis present and compartment soft.   LABS  Basename  05/11/12 0457   HGB  9.6  HCT  29.2   POD #2  BP: 138/82 ; Pulse: 89 ; Temp: 98.7 F (37.1 C) ; Resp: 16  Patient reports pain as moderate, pain not controlled well on Norco. She states that she has taken Oxycodone previously and done well with pain. No events throughout the night.  Neurovascular intact, dorsiflexion/plantar flexion intact, incision: dressing C/D/I, no cellulitis present and compartment soft.   LABS  Basename  05/12/12 0430   HGB  9.7  HCT  28.1   POD #3  BP: 132/77 ; Pulse: 97 ; Temp: 98.1 F (36.7 C) ; Resp: 16 Patient reports pain as mild, pain well controlled. No events throughout the night. Ready to be discharged to SNF today. Neurovascular intact, dorsiflexion/plantar flexion intact, incision: dressing C/D/I, no cellulitis present and compartment soft.   LABS   No new labs   Discharge Exam: General appearance: alert, cooperative and no distress Extremities: Homans sign is negative, no sign of DVT, no edema, redness or tenderness in the calves or thighs and no ulcers, gangrene or trophic changes  Disposition:   Home or Self Care with follow up in 2 weeks   Follow-up Information    Follow up with Shelda Pal, MD. Schedule an appointment as soon as possible for a visit in 2 weeks.   Contact information:   [address] [place] [address] [phone]          Discharge Orders    Future Appointments: Provider: Department: Dept Phone: Center:   06/22/2012  9:40 AM Emelia Loron, MD [facility] Surgery, [place] [phone] None     Future Orders Please Complete By Expires   Diet - low sodium heart healthy      Call MD / Call 911      Comments:   If you experience chest pain or shortness of breath, CALL 911 and be transported to the hospital emergency room.  If you develope a fever above 101 F, pus  (white drainage) or increased drainage or redness at the wound, or calf pain, call your surgeon's office.   Discharge instructions      Comments:   Maintain surgical dressing for 10-14 days, then replace with gauze and tape. Keep the area dry and clean until follow up. Follow up in 2 weeks at [facility] Call with any questions or concerns.   Constipation Prevention      Comments:   Drink plenty of fluids.  Prune juice may be helpful.  You may use a stool softener, such as Colace (over the counter) 100 mg twice a day.  Use MiraLax (over the counter) for constipation as needed.   Increase activity slowly as tolerated      Driving restrictions      Comments:   No driving for 4 weeks   TED hose      Comments:   Use stockings (TED hose) for 2 weeks on both leg(s).  You may remove them at night for sleeping.   Change dressing      Comments:   Maintain surgical dressing for 10-14 days, then change the dressing daily with sterile 4 x 4 inch gauze dressing and tape. Keep the area dry and clean.      Current Discharge Medication List    START taking these medications   Details  diphenhydrAMINE (BENADRYL) 25 mg capsule Take 1 capsule (25 mg total) by mouth every 6 (six) hours as needed for itching, allergies or sleep. Qty: 30 capsule    ferrous sulfate 325 (65 FE) MG tablet Take 1 tablet (325 mg total) by mouth 3 (three) times daily after meals.    methocarbamol (ROBAXIN) 500 MG tablet Take 1 tablet (500 mg total) by mouth every 6 (six) hours as needed (muscle spasms). Qty: 50 tablet, Refills: 0    oxyCODONE (OXY IR/ROXICODONE) 5 MG immediate release tablet Take 1-3 tablets (5-15 mg total) by mouth every 4 (four) hours as needed for pain. Qty: 120 tablet, Refills: 0    polyethylene glycol (MIRALAX / GLYCOLAX) packet Take 17 g by mouth 2 (two) times daily. Qty: 14 each      CONTINUE these medications which have CHANGED   Details  docusate sodium 100 MG CAPS Take 100 mg by  mouth 2 (two) times daily. Qty: 10 capsule      CONTINUE these medications which have NOT CHANGED   Details  ARIPiprazole (ABILIFY) 2 MG tablet Take 2 mg by mouth every morning.     buPROPion (WELLBUTRIN XL) 150 MG 24 hr tablet Take 300 mg by mouth every morning.     carvedilol (COREG) 3.125 MG tablet Take 3.125 mg by mouth 2 (two) times daily with a meal.    clonazePAM (KLONOPIN) 0.5 MG tablet Take 0.5-1 mg by mouth 2 (two) times daily as needed. 2 in morning, 1 at bedtime    febuxostat (ULORIC) 40 MG tablet Take 40 mg by mouth at bedtime.     Fluticasone-Salmeterol (ADVAIR) 250-50 MCG/DOSE AEPB Inhale 1 puff into the lungs every 12 (twelve)  hours.     gabapentin (NEURONTIN) 300 MG capsule Take 300 mg by mouth 2 (two) times daily. May take 3 times daily as needed    levothyroxine (SYNTHROID, LEVOTHROID) 125 MCG tablet Take 125 mcg by mouth every morning. On an empty stomach    metFORMIN (GLUMETZA) 500 MG (MOD) 24 hr tablet Take 500 mg by mouth 2 (two) times daily.    Multiple Vitamin (MULTIVITAMIN WITH MINERALS) TABS Take 1 tablet by mouth daily.    PARoxetine (PAXIL-CR) 25 MG 24 hr tablet Take 25 mg by mouth 2 (two) times daily.     potassium chloride SA (K-DUR,KLOR-CON) 20 MEQ tablet Take 20 mEq by mouth daily.     simvastatin (ZOCOR) 10 MG tablet Take 10 mg by mouth every evening.     torsemide (DEMADEX) 20 MG tablet Take 20 mg by mouth 2 (two) times daily at 10 AM and 5 PM.    Vitamin D, Ergocalciferol, (DRISDOL) 50000 UNITS CAPS Take 50,000 Units by mouth every Monday.    zolpidem (AMBIEN) 10 MG tablet Take 10 mg by mouth at bedtime as needed. For sleep    albuterol (PROVENTIL HFA;VENTOLIN HFA) 108 (90 BASE) MCG/ACT inhaler Inhale 2 puffs into the lungs every 6 (six) hours as needed. Wheezing     albuterol (PROVENTIL) (2.5 MG/3ML) 0.083% nebulizer solution Take 2.5 mg by nebulization every 6 (six) hours as needed. Wheezing       STOP taking these medications      diclofenac sodium (VOLTAREN) 1 % GEL Comments:  Reason for Stopping:       ibuprofen (ADVIL,MOTRIN) 800 MG tablet Comments:  Reason for Stopping:           Signed: Anastasio Auerbach. Muslima Toppins   PAC  05/13/2012, 7:46 AM

## 2012-05-13 NOTE — Progress Notes (Signed)
   Subjective: 3 Days Post-Op Procedure(s) (LRB): TOTAL KNEE ARTHROPLASTY (Right)   Patient reports pain as mild, pain well controlled. No events throughout the night. Ready to be discharged to SNF today.  Objective:   VITALS:   Filed Vitals:   05/13/12 0451  BP: 132/77  Pulse: 97  Temp: 98.1 F (36.7 C)  Resp: 16    Neurovascular intact Dorsiflexion/Plantar flexion intact Incision: dressing C/D/I No cellulitis present Compartment soft  LABS  Basename 05/12/12 0430 05/11/12 0457  HGB 9.7* 9.6*  HCT 28.1* 29.2*  WBC 9.2 7.8  PLT 290 273     Basename 05/12/12 0430 05/11/12 0457  NA 138 139  K 3.3* 3.1*  BUN 20 27*  CREATININE 1.26* 1.38*  GLUCOSE 126* 125*     Assessment/Plan: 3 Days Post-Op Procedure(s) (LRB): TOTAL KNEE ARTHROPLASTY (Right) Up with therapy Discharge to SNF today Follow up in 2 weeks at [facility]. Follow up with OLIN,Gjon Letarte D in 2 weeks.  Contact information:  [facility] [address], [address] [place] [address] [phone]    Expected ABLA  Treated with iron and will observe   Morbid Obesity (BMI >40)  Estimated Body mass index is 48.52 kg/(m^2) as calculated from the following:     Height as of 01/14/12: 5\' 2" (1.575 m).     Weight as of 01/14/12: 265 lb 4.8 oz(120.339 kg).  Patient also counseled that weight may inhibit the healing process  Patient counseled that losing weight will help with future health issues   Hypokalemia  Treated with oral potassium and will observe       Anastasio Auerbach. Jakari Sada   PAC  05/13/2012, 7:40 AM

## 2012-05-13 NOTE — Progress Notes (Signed)
Patient discharged via ambulance to [facility].  Report called to SNF.  No change from AM assessment.

## 2012-05-13 NOTE — Care Management Note (Signed)
    Page 1 of 1   05/13/2012     4:11:34 PM   CARE MANAGEMENT NOTE 05/13/2012  Patient:  Rhonda [name], Rhonda [name]   Account Number:  [id number]  Date Initiated:  05/13/2012  Documentation initiated by:  Colleen Can  Subjective/Objective Assessment:   dx total rt knee arthroplasty on dayu of admission     Action/Plan:   SNF rehab  There are not home health or DME needs at this time   Anticipated DC Date:  05/13/2012   Anticipated DC Plan:  SKILLED NURSING FACILITY  In-house referral  Clinical Social Worker      DC Planning Services  CM consult      Choice offered to / List presented to:             Status of service:  Completed, signed off Medicare Important Message given?   (If response is "NO", the following Medicare IM given date fields will be blank) Date Medicare IM given:   Date Additional Medicare IM given:    Discharge Disposition:  SKILLED NURSING FACILITY  Per UR Regulation:    If discussed at Long Length of Stay Meetings, dates discussed:    Comments:

## 2012-05-13 NOTE — Progress Notes (Signed)
Clinical Social Work Department CLINICAL SOCIAL WORK PLACEMENT NOTE 05/13/2012  Patient:  Rhonda [name], Rhonda [name]  Account Number:  [id number] Admit date:  05/10/2012  Clinical Social Worker:  Cori Razor, LCSW  Date/time:  05/11/2012 12:57 PM  Clinical Social Work is seeking post-discharge placement for this patient at the following level of care:   SKILLED NURSING   (*CSW will update this form in Epic as items are completed)   05/11/2012  Patient/family provided with [facility] Department of Clinical Social Work's list of facilities offering this level of care within the geographic area requested by the patient (or if unable, by the patient's family).  05/11/2012  Patient/family informed of their freedom to choose among providers that offer the needed level of care, that participate in Medicare, Medicaid or managed care program needed by the patient, have an available bed and are willing to accept the patient.  05/11/2012  Patient/family informed of [facility]' ownership interest in [facility], as well as of the fact that they are under no obligation to receive care at this facility.  PASARR submitted to EDS on 05/11/2012 PASARR number received from EDS on   FL2 transmitted to all facilities in geographic area requested by pt/family on  05/11/2012 FL2 transmitted to all facilities within larger geographic area on   Patient informed that his/her managed care company has contracts with or will negotiate with  certain facilities, including the following:     Patient/family informed of bed offers received:  05/11/2012 Patient chooses bed at [facility] Physician recommends and patient chooses bed at    Patient to be transferred to [facility] on  05/13/2012 Patient to be transferred to facility by [facility]  The following physician request were entered in Epic:   Additional Comments:  Cori Razor LCSW [phone]

## 2012-05-13 NOTE — Progress Notes (Signed)
Physical Therapy Treatment Patient Details Name: Rhonda [name] MRN: [id number] DOB: July 31, 1950 Today's Date: 05/13/2012 Time: 1478-2956 PT Time Calculation (min): 16 min  PT Assessment / Plan / Recommendation Comments on Treatment Session  progressing slowly, will benefit from STSNF    Follow Up Recommendations  SNF;Supervision/Assistance - 24 hour     Does the patient have the potential to tolerate intense rehabilitation     Barriers to Discharge        Equipment Recommendations  None recommended by PT    Recommendations for Other Services    Frequency 7X/week   Plan Discharge plan remains appropriate;Frequency remains appropriate    Precautions / Restrictions Precautions Precautions: Knee Required Braces or Orthoses: Knee Immobilizer - Right Knee Immobilizer - Right: Discontinue once straight leg raise with < 10 degree lag Restrictions RLE Weight Bearing: Weight bearing as tolerated   Pertinent Vitals/Pain     Mobility  Transfers Transfers: Sit to Stand;Stand to Sit Sit to Stand: 4: Min guard;With upper extremity assist;From chair/3-in-1 Stand to Sit: 4: Min guard;With upper extremity assist;To chair/3-in-1 Details for Transfer Assistance: verbal cues for hand placement, R LE forward  Ambulation/Gait Ambulation/Gait Assistance: 4: Min guard Ambulation Distance (Feet): 48 Feet Assistive device: Rolling walker Ambulation/Gait Assistance Details: increased time for ambulation, verbal cues for RW distance, sequence, step length Gait Pattern: Step-to pattern;Antalgic Gait velocity: decreased    Exercises     PT Diagnosis:    PT Problem List:   PT Treatment Interventions:     PT Goals Acute Rehab PT Goals Time For Goal Achievement: 05/18/12 Potential to Achieve Goals: Good Pt will go Sit to Stand: with supervision PT Goal: Sit to Stand - Progress: Progressing toward goal Pt will go Stand to Sit: with supervision PT Goal: Stand to Sit - Progress: Progressing  toward goal Pt will Ambulate: 51 - 150 feet;with supervision;with least restrictive assistive device PT Goal: Ambulate - Progress: Progressing toward goal  Visit Information  Last PT Received On: 05/13/12 Assistance Needed: +1    Subjective Data  Subjective: i do not want to   Cognition  Overall Cognitive Status: Appears within functional limits for tasks assessed/performed Arousal/Alertness: Awake/alert Orientation Level: Appears intact for tasks assessed Behavior During Session: [facility] for tasks performed Cognition - Other Comments: Pt slow to process at times.    Balance     End of Session PT - End of Session Equipment Utilized During Treatment: Right knee immobilizer Activity Tolerance: Patient limited by fatigue;Patient limited by pain Patient left: in chair;with call bell/phone within reach;with family/visitor present Nurse Communication: Patient requests pain meds   GP     [facility] 05/13/2012, 11:53 AM

## 2012-05-16 DIAGNOSIS — F3289 Other specified depressive episodes: Secondary | ICD-10-CM | POA: Diagnosis not present

## 2012-05-16 DIAGNOSIS — F329 Major depressive disorder, single episode, unspecified: Secondary | ICD-10-CM | POA: Diagnosis not present

## 2012-05-16 DIAGNOSIS — M1710 Unilateral primary osteoarthritis, unspecified knee: Secondary | ICD-10-CM | POA: Diagnosis not present

## 2012-05-16 DIAGNOSIS — E1349 Other specified diabetes mellitus with other diabetic neurological complication: Secondary | ICD-10-CM | POA: Diagnosis not present

## 2012-05-16 DIAGNOSIS — K5900 Constipation, unspecified: Secondary | ICD-10-CM | POA: Diagnosis not present

## 2012-05-16 DIAGNOSIS — M109 Gout, unspecified: Secondary | ICD-10-CM | POA: Diagnosis not present

## 2012-05-16 DIAGNOSIS — I509 Heart failure, unspecified: Secondary | ICD-10-CM | POA: Diagnosis not present

## 2012-06-14 DIAGNOSIS — M1710 Unilateral primary osteoarthritis, unspecified knee: Secondary | ICD-10-CM | POA: Diagnosis not present

## 2012-06-14 DIAGNOSIS — G894 Chronic pain syndrome: Secondary | ICD-10-CM | POA: Diagnosis not present

## 2012-06-14 DIAGNOSIS — D509 Iron deficiency anemia, unspecified: Secondary | ICD-10-CM | POA: Diagnosis not present

## 2012-06-14 DIAGNOSIS — I509 Heart failure, unspecified: Secondary | ICD-10-CM | POA: Diagnosis not present

## 2012-06-14 DIAGNOSIS — G609 Hereditary and idiopathic neuropathy, unspecified: Secondary | ICD-10-CM | POA: Diagnosis not present

## 2012-06-14 DIAGNOSIS — J329 Chronic sinusitis, unspecified: Secondary | ICD-10-CM | POA: Diagnosis not present

## 2012-06-18 DIAGNOSIS — E119 Type 2 diabetes mellitus without complications: Secondary | ICD-10-CM | POA: Diagnosis not present

## 2012-06-18 DIAGNOSIS — F3289 Other specified depressive episodes: Secondary | ICD-10-CM | POA: Diagnosis not present

## 2012-06-18 DIAGNOSIS — Z471 Aftercare following joint replacement surgery: Secondary | ICD-10-CM | POA: Diagnosis not present

## 2012-06-18 DIAGNOSIS — Z5189 Encounter for other specified aftercare: Secondary | ICD-10-CM | POA: Diagnosis not present

## 2012-06-18 DIAGNOSIS — F329 Major depressive disorder, single episode, unspecified: Secondary | ICD-10-CM | POA: Diagnosis not present

## 2012-06-18 DIAGNOSIS — Z4789 Encounter for other orthopedic aftercare: Secondary | ICD-10-CM | POA: Diagnosis not present

## 2012-06-18 DIAGNOSIS — Z96659 Presence of unspecified artificial knee joint: Secondary | ICD-10-CM | POA: Diagnosis not present

## 2012-06-21 DIAGNOSIS — Z96659 Presence of unspecified artificial knee joint: Secondary | ICD-10-CM | POA: Diagnosis not present

## 2012-06-21 DIAGNOSIS — F3289 Other specified depressive episodes: Secondary | ICD-10-CM | POA: Diagnosis not present

## 2012-06-21 DIAGNOSIS — F329 Major depressive disorder, single episode, unspecified: Secondary | ICD-10-CM | POA: Diagnosis not present

## 2012-06-21 DIAGNOSIS — Z5189 Encounter for other specified aftercare: Secondary | ICD-10-CM | POA: Diagnosis not present

## 2012-06-21 DIAGNOSIS — E119 Type 2 diabetes mellitus without complications: Secondary | ICD-10-CM | POA: Diagnosis not present

## 2012-06-21 DIAGNOSIS — Z471 Aftercare following joint replacement surgery: Secondary | ICD-10-CM | POA: Diagnosis not present

## 2012-06-22 ENCOUNTER — Ambulatory Visit (INDEPENDENT_AMBULATORY_CARE_PROVIDER_SITE_OTHER): Payer: Medicare Other | Admitting: General Surgery

## 2012-06-22 DIAGNOSIS — Z5189 Encounter for other specified aftercare: Secondary | ICD-10-CM | POA: Diagnosis not present

## 2012-06-22 DIAGNOSIS — Z96659 Presence of unspecified artificial knee joint: Secondary | ICD-10-CM | POA: Diagnosis not present

## 2012-06-22 DIAGNOSIS — Z471 Aftercare following joint replacement surgery: Secondary | ICD-10-CM | POA: Diagnosis not present

## 2012-06-22 DIAGNOSIS — F3289 Other specified depressive episodes: Secondary | ICD-10-CM | POA: Diagnosis not present

## 2012-06-22 DIAGNOSIS — F329 Major depressive disorder, single episode, unspecified: Secondary | ICD-10-CM | POA: Diagnosis not present

## 2012-06-22 DIAGNOSIS — E119 Type 2 diabetes mellitus without complications: Secondary | ICD-10-CM | POA: Diagnosis not present

## 2012-06-23 DIAGNOSIS — F329 Major depressive disorder, single episode, unspecified: Secondary | ICD-10-CM | POA: Diagnosis not present

## 2012-06-23 DIAGNOSIS — E119 Type 2 diabetes mellitus without complications: Secondary | ICD-10-CM | POA: Diagnosis not present

## 2012-06-23 DIAGNOSIS — IMO0002 Reserved for concepts with insufficient information to code with codable children: Secondary | ICD-10-CM | POA: Diagnosis not present

## 2012-06-23 DIAGNOSIS — Z5189 Encounter for other specified aftercare: Secondary | ICD-10-CM | POA: Diagnosis not present

## 2012-06-23 DIAGNOSIS — Z471 Aftercare following joint replacement surgery: Secondary | ICD-10-CM | POA: Diagnosis not present

## 2012-06-23 DIAGNOSIS — M1710 Unilateral primary osteoarthritis, unspecified knee: Secondary | ICD-10-CM | POA: Diagnosis not present

## 2012-06-23 DIAGNOSIS — F3289 Other specified depressive episodes: Secondary | ICD-10-CM | POA: Diagnosis not present

## 2012-06-23 DIAGNOSIS — Z96659 Presence of unspecified artificial knee joint: Secondary | ICD-10-CM | POA: Diagnosis not present

## 2012-06-24 DIAGNOSIS — Z5189 Encounter for other specified aftercare: Secondary | ICD-10-CM | POA: Diagnosis not present

## 2012-06-24 DIAGNOSIS — E119 Type 2 diabetes mellitus without complications: Secondary | ICD-10-CM | POA: Diagnosis not present

## 2012-06-24 DIAGNOSIS — Z96659 Presence of unspecified artificial knee joint: Secondary | ICD-10-CM | POA: Diagnosis not present

## 2012-06-24 DIAGNOSIS — Z471 Aftercare following joint replacement surgery: Secondary | ICD-10-CM | POA: Diagnosis not present

## 2012-06-24 DIAGNOSIS — F329 Major depressive disorder, single episode, unspecified: Secondary | ICD-10-CM | POA: Diagnosis not present

## 2012-06-24 DIAGNOSIS — F3289 Other specified depressive episodes: Secondary | ICD-10-CM | POA: Diagnosis not present

## 2012-06-28 DIAGNOSIS — F329 Major depressive disorder, single episode, unspecified: Secondary | ICD-10-CM | POA: Diagnosis not present

## 2012-06-28 DIAGNOSIS — E119 Type 2 diabetes mellitus without complications: Secondary | ICD-10-CM | POA: Diagnosis not present

## 2012-06-28 DIAGNOSIS — Z96659 Presence of unspecified artificial knee joint: Secondary | ICD-10-CM | POA: Diagnosis not present

## 2012-06-28 DIAGNOSIS — Z471 Aftercare following joint replacement surgery: Secondary | ICD-10-CM | POA: Diagnosis not present

## 2012-06-28 DIAGNOSIS — Z5189 Encounter for other specified aftercare: Secondary | ICD-10-CM | POA: Diagnosis not present

## 2012-06-28 DIAGNOSIS — F3289 Other specified depressive episodes: Secondary | ICD-10-CM | POA: Diagnosis not present

## 2012-06-30 DIAGNOSIS — F329 Major depressive disorder, single episode, unspecified: Secondary | ICD-10-CM | POA: Diagnosis not present

## 2012-06-30 DIAGNOSIS — F3289 Other specified depressive episodes: Secondary | ICD-10-CM | POA: Diagnosis not present

## 2012-06-30 DIAGNOSIS — Z471 Aftercare following joint replacement surgery: Secondary | ICD-10-CM | POA: Diagnosis not present

## 2012-06-30 DIAGNOSIS — Z5189 Encounter for other specified aftercare: Secondary | ICD-10-CM | POA: Diagnosis not present

## 2012-06-30 DIAGNOSIS — Z96659 Presence of unspecified artificial knee joint: Secondary | ICD-10-CM | POA: Diagnosis not present

## 2012-06-30 DIAGNOSIS — E119 Type 2 diabetes mellitus without complications: Secondary | ICD-10-CM | POA: Diagnosis not present

## 2012-07-01 DIAGNOSIS — E119 Type 2 diabetes mellitus without complications: Secondary | ICD-10-CM | POA: Diagnosis not present

## 2012-07-01 DIAGNOSIS — Z471 Aftercare following joint replacement surgery: Secondary | ICD-10-CM | POA: Diagnosis not present

## 2012-07-01 DIAGNOSIS — Z96659 Presence of unspecified artificial knee joint: Secondary | ICD-10-CM | POA: Diagnosis not present

## 2012-07-01 DIAGNOSIS — F3289 Other specified depressive episodes: Secondary | ICD-10-CM | POA: Diagnosis not present

## 2012-07-01 DIAGNOSIS — F329 Major depressive disorder, single episode, unspecified: Secondary | ICD-10-CM | POA: Diagnosis not present

## 2012-07-01 DIAGNOSIS — Z5189 Encounter for other specified aftercare: Secondary | ICD-10-CM | POA: Diagnosis not present

## 2012-07-05 DIAGNOSIS — Z96659 Presence of unspecified artificial knee joint: Secondary | ICD-10-CM | POA: Diagnosis not present

## 2012-07-05 DIAGNOSIS — Z5189 Encounter for other specified aftercare: Secondary | ICD-10-CM | POA: Diagnosis not present

## 2012-07-05 DIAGNOSIS — F329 Major depressive disorder, single episode, unspecified: Secondary | ICD-10-CM | POA: Diagnosis not present

## 2012-07-05 DIAGNOSIS — E119 Type 2 diabetes mellitus without complications: Secondary | ICD-10-CM | POA: Diagnosis not present

## 2012-07-05 DIAGNOSIS — F3289 Other specified depressive episodes: Secondary | ICD-10-CM | POA: Diagnosis not present

## 2012-07-05 DIAGNOSIS — Z471 Aftercare following joint replacement surgery: Secondary | ICD-10-CM | POA: Diagnosis not present

## 2012-07-06 DIAGNOSIS — Z5189 Encounter for other specified aftercare: Secondary | ICD-10-CM | POA: Diagnosis not present

## 2012-07-06 DIAGNOSIS — Z471 Aftercare following joint replacement surgery: Secondary | ICD-10-CM | POA: Diagnosis not present

## 2012-07-06 DIAGNOSIS — F3289 Other specified depressive episodes: Secondary | ICD-10-CM | POA: Diagnosis not present

## 2012-07-06 DIAGNOSIS — F329 Major depressive disorder, single episode, unspecified: Secondary | ICD-10-CM | POA: Diagnosis not present

## 2012-07-06 DIAGNOSIS — Z96659 Presence of unspecified artificial knee joint: Secondary | ICD-10-CM | POA: Diagnosis not present

## 2012-07-06 DIAGNOSIS — E119 Type 2 diabetes mellitus without complications: Secondary | ICD-10-CM | POA: Diagnosis not present

## 2012-07-08 DIAGNOSIS — E119 Type 2 diabetes mellitus without complications: Secondary | ICD-10-CM | POA: Diagnosis not present

## 2012-07-08 DIAGNOSIS — Z5189 Encounter for other specified aftercare: Secondary | ICD-10-CM | POA: Diagnosis not present

## 2012-07-08 DIAGNOSIS — Z471 Aftercare following joint replacement surgery: Secondary | ICD-10-CM | POA: Diagnosis not present

## 2012-07-08 DIAGNOSIS — F329 Major depressive disorder, single episode, unspecified: Secondary | ICD-10-CM | POA: Diagnosis not present

## 2012-07-08 DIAGNOSIS — Z96659 Presence of unspecified artificial knee joint: Secondary | ICD-10-CM | POA: Diagnosis not present

## 2012-07-08 DIAGNOSIS — F3289 Other specified depressive episodes: Secondary | ICD-10-CM | POA: Diagnosis not present

## 2012-07-11 DIAGNOSIS — Z471 Aftercare following joint replacement surgery: Secondary | ICD-10-CM | POA: Diagnosis not present

## 2012-07-11 DIAGNOSIS — F3289 Other specified depressive episodes: Secondary | ICD-10-CM | POA: Diagnosis not present

## 2012-07-11 DIAGNOSIS — F329 Major depressive disorder, single episode, unspecified: Secondary | ICD-10-CM | POA: Diagnosis not present

## 2012-07-11 DIAGNOSIS — E119 Type 2 diabetes mellitus without complications: Secondary | ICD-10-CM | POA: Diagnosis not present

## 2012-07-11 DIAGNOSIS — Z5189 Encounter for other specified aftercare: Secondary | ICD-10-CM | POA: Diagnosis not present

## 2012-07-11 DIAGNOSIS — Z96659 Presence of unspecified artificial knee joint: Secondary | ICD-10-CM | POA: Diagnosis not present

## 2012-07-12 ENCOUNTER — Ambulatory Visit (INDEPENDENT_AMBULATORY_CARE_PROVIDER_SITE_OTHER): Payer: Medicare Other | Admitting: General Surgery

## 2012-07-13 DIAGNOSIS — Z853 Personal history of malignant neoplasm of breast: Secondary | ICD-10-CM | POA: Diagnosis not present

## 2012-07-15 DIAGNOSIS — F329 Major depressive disorder, single episode, unspecified: Secondary | ICD-10-CM | POA: Diagnosis not present

## 2012-07-15 DIAGNOSIS — Z96659 Presence of unspecified artificial knee joint: Secondary | ICD-10-CM | POA: Diagnosis not present

## 2012-07-15 DIAGNOSIS — E119 Type 2 diabetes mellitus without complications: Secondary | ICD-10-CM | POA: Diagnosis not present

## 2012-07-15 DIAGNOSIS — F3289 Other specified depressive episodes: Secondary | ICD-10-CM | POA: Diagnosis not present

## 2012-07-15 DIAGNOSIS — Z471 Aftercare following joint replacement surgery: Secondary | ICD-10-CM | POA: Diagnosis not present

## 2012-07-15 DIAGNOSIS — Z5189 Encounter for other specified aftercare: Secondary | ICD-10-CM | POA: Diagnosis not present

## 2012-07-20 DIAGNOSIS — J019 Acute sinusitis, unspecified: Secondary | ICD-10-CM | POA: Diagnosis not present

## 2012-07-20 DIAGNOSIS — E039 Hypothyroidism, unspecified: Secondary | ICD-10-CM | POA: Diagnosis not present

## 2012-07-20 DIAGNOSIS — E1129 Type 2 diabetes mellitus with other diabetic kidney complication: Secondary | ICD-10-CM | POA: Diagnosis not present

## 2012-07-20 DIAGNOSIS — D508 Other iron deficiency anemias: Secondary | ICD-10-CM | POA: Diagnosis not present

## 2012-07-20 DIAGNOSIS — N183 Chronic kidney disease, stage 3 unspecified: Secondary | ICD-10-CM | POA: Diagnosis not present

## 2012-07-23 DIAGNOSIS — L600 Ingrowing nail: Secondary | ICD-10-CM | POA: Diagnosis not present

## 2012-08-02 DIAGNOSIS — M25569 Pain in unspecified knee: Secondary | ICD-10-CM | POA: Diagnosis not present

## 2012-08-05 ENCOUNTER — Encounter (INDEPENDENT_AMBULATORY_CARE_PROVIDER_SITE_OTHER): Payer: Self-pay | Admitting: General Surgery

## 2012-08-05 ENCOUNTER — Ambulatory Visit (INDEPENDENT_AMBULATORY_CARE_PROVIDER_SITE_OTHER): Payer: Medicare Other | Admitting: General Surgery

## 2012-08-05 VITALS — BP 140/78 | HR 84 | Resp 18 | Ht 62.0 in | Wt 266.0 lb

## 2012-08-05 DIAGNOSIS — C50919 Malignant neoplasm of unspecified site of unspecified female breast: Secondary | ICD-10-CM | POA: Diagnosis not present

## 2012-08-05 DIAGNOSIS — C50912 Malignant neoplasm of unspecified site of left female breast: Secondary | ICD-10-CM

## 2012-08-08 ENCOUNTER — Encounter (INDEPENDENT_AMBULATORY_CARE_PROVIDER_SITE_OTHER): Payer: Self-pay | Admitting: General Surgery

## 2012-08-08 NOTE — Progress Notes (Signed)
Subjective:     Patient ID: Rhonda [name], female   DOB: September 15, 1950, 62 y.o.   MRN: [id number]  HPI 89 yof who underwent left lumpectomy/sentinel node biopsy for stage I TNBC, s/p chemo/xrt, port has been removed.  She returns today for follow up having undergone knee replacement surgery from which she has done well.  She has no real complaints referable to either breast.  She has no complaints of lymphedema or limitations of her left arm.  She has undergone a mammogram that is birads 2 and recommended for diagnostic in one year.   Review of Systems     Objective:   Physical Exam  Vitals reviewed. Constitutional: She appears well-developed and well-nourished.  Pulmonary/Chest: Right breast exhibits no inverted nipple, no mass, no nipple discharge, no skin change and no tenderness. Left breast exhibits skin change (c/w xrt). Left breast exhibits no inverted nipple, no mass, no nipple discharge and no tenderness. Breasts are symmetrical.    Lymphadenopathy:    She has no cervical adenopathy.    She has no axillary adenopathy.       Right: No supraclavicular adenopathy present.       Left: No supraclavicular adenopathy present.       Assessment:     History of stage I breast cancer    Plan:     She has undergone maximal therapy.  She has no clinical evidence of recurrence.  Her mm is up to date. She is otherwise functionally doing well.  Will plan on seeing annually for an exam, continue her monthly self breast exams, and annual mammography.

## 2012-08-11 DIAGNOSIS — M25569 Pain in unspecified knee: Secondary | ICD-10-CM | POA: Diagnosis not present

## 2012-08-18 DIAGNOSIS — M25569 Pain in unspecified knee: Secondary | ICD-10-CM | POA: Diagnosis not present

## 2012-08-23 DIAGNOSIS — Q369 Cleft lip, unilateral: Secondary | ICD-10-CM | POA: Diagnosis not present

## 2012-08-23 DIAGNOSIS — J329 Chronic sinusitis, unspecified: Secondary | ICD-10-CM | POA: Diagnosis not present

## 2012-08-23 DIAGNOSIS — M25569 Pain in unspecified knee: Secondary | ICD-10-CM | POA: Diagnosis not present

## 2012-08-23 DIAGNOSIS — J45901 Unspecified asthma with (acute) exacerbation: Secondary | ICD-10-CM | POA: Diagnosis not present

## 2012-08-23 DIAGNOSIS — G609 Hereditary and idiopathic neuropathy, unspecified: Secondary | ICD-10-CM | POA: Diagnosis not present

## 2012-08-23 DIAGNOSIS — E039 Hypothyroidism, unspecified: Secondary | ICD-10-CM | POA: Diagnosis not present

## 2012-08-23 DIAGNOSIS — J321 Chronic frontal sinusitis: Secondary | ICD-10-CM | POA: Diagnosis not present

## 2012-09-01 DIAGNOSIS — M25569 Pain in unspecified knee: Secondary | ICD-10-CM | POA: Diagnosis not present

## 2012-09-06 DIAGNOSIS — M25569 Pain in unspecified knee: Secondary | ICD-10-CM | POA: Diagnosis not present

## 2012-09-07 DIAGNOSIS — M25569 Pain in unspecified knee: Secondary | ICD-10-CM | POA: Diagnosis not present

## 2012-09-12 DIAGNOSIS — M25569 Pain in unspecified knee: Secondary | ICD-10-CM | POA: Diagnosis not present

## 2012-09-13 DIAGNOSIS — M25569 Pain in unspecified knee: Secondary | ICD-10-CM | POA: Diagnosis not present

## 2012-09-14 ENCOUNTER — Other Ambulatory Visit: Payer: Self-pay | Admitting: *Deleted

## 2012-09-14 DIAGNOSIS — Z4789 Encounter for other orthopedic aftercare: Secondary | ICD-10-CM | POA: Diagnosis not present

## 2012-09-14 DIAGNOSIS — IMO0002 Reserved for concepts with insufficient information to code with codable children: Secondary | ICD-10-CM | POA: Diagnosis not present

## 2012-09-14 DIAGNOSIS — M25569 Pain in unspecified knee: Secondary | ICD-10-CM | POA: Diagnosis not present

## 2012-09-14 DIAGNOSIS — M1710 Unilateral primary osteoarthritis, unspecified knee: Secondary | ICD-10-CM | POA: Diagnosis not present

## 2012-09-14 NOTE — Telephone Encounter (Signed)
[facility] on [address] sent refill request for Bupropion. Not our current patient. Denied.

## 2012-09-16 DIAGNOSIS — M25569 Pain in unspecified knee: Secondary | ICD-10-CM | POA: Diagnosis not present

## 2012-09-19 ENCOUNTER — Other Ambulatory Visit: Payer: Self-pay | Admitting: *Deleted

## 2012-09-20 DIAGNOSIS — M25569 Pain in unspecified knee: Secondary | ICD-10-CM | POA: Diagnosis not present

## 2012-09-23 DIAGNOSIS — M25569 Pain in unspecified knee: Secondary | ICD-10-CM | POA: Diagnosis not present

## 2012-09-29 DIAGNOSIS — I5022 Chronic systolic (congestive) heart failure: Secondary | ICD-10-CM | POA: Diagnosis not present

## 2012-09-29 DIAGNOSIS — I10 Essential (primary) hypertension: Secondary | ICD-10-CM | POA: Diagnosis not present

## 2012-09-29 DIAGNOSIS — Z79899 Other long term (current) drug therapy: Secondary | ICD-10-CM | POA: Diagnosis not present

## 2012-10-05 DIAGNOSIS — R609 Edema, unspecified: Secondary | ICD-10-CM | POA: Diagnosis not present

## 2012-10-05 DIAGNOSIS — I428 Other cardiomyopathies: Secondary | ICD-10-CM | POA: Diagnosis not present

## 2012-10-05 DIAGNOSIS — E039 Hypothyroidism, unspecified: Secondary | ICD-10-CM | POA: Diagnosis not present

## 2012-10-05 DIAGNOSIS — I5022 Chronic systolic (congestive) heart failure: Secondary | ICD-10-CM | POA: Diagnosis not present

## 2012-10-05 DIAGNOSIS — I10 Essential (primary) hypertension: Secondary | ICD-10-CM | POA: Diagnosis not present

## 2012-10-05 DIAGNOSIS — R0602 Shortness of breath: Secondary | ICD-10-CM | POA: Diagnosis not present

## 2012-10-05 DIAGNOSIS — Z79899 Other long term (current) drug therapy: Secondary | ICD-10-CM | POA: Diagnosis not present

## 2012-10-05 DIAGNOSIS — I447 Left bundle-branch block, unspecified: Secondary | ICD-10-CM | POA: Diagnosis not present

## 2012-10-10 DIAGNOSIS — Z79899 Other long term (current) drug therapy: Secondary | ICD-10-CM | POA: Diagnosis not present

## 2012-10-10 DIAGNOSIS — I447 Left bundle-branch block, unspecified: Secondary | ICD-10-CM | POA: Diagnosis not present

## 2012-10-10 DIAGNOSIS — I10 Essential (primary) hypertension: Secondary | ICD-10-CM | POA: Diagnosis not present

## 2012-10-10 DIAGNOSIS — I5022 Chronic systolic (congestive) heart failure: Secondary | ICD-10-CM | POA: Diagnosis not present

## 2012-10-10 DIAGNOSIS — R609 Edema, unspecified: Secondary | ICD-10-CM | POA: Diagnosis not present

## 2012-10-14 DIAGNOSIS — I10 Essential (primary) hypertension: Secondary | ICD-10-CM | POA: Diagnosis not present

## 2012-10-14 DIAGNOSIS — I447 Left bundle-branch block, unspecified: Secondary | ICD-10-CM | POA: Diagnosis not present

## 2012-10-14 DIAGNOSIS — M25569 Pain in unspecified knee: Secondary | ICD-10-CM | POA: Diagnosis not present

## 2012-10-14 DIAGNOSIS — I428 Other cardiomyopathies: Secondary | ICD-10-CM | POA: Diagnosis not present

## 2012-10-14 DIAGNOSIS — I5022 Chronic systolic (congestive) heart failure: Secondary | ICD-10-CM | POA: Diagnosis not present

## 2012-10-14 DIAGNOSIS — R609 Edema, unspecified: Secondary | ICD-10-CM | POA: Diagnosis not present

## 2012-10-18 DIAGNOSIS — I10 Essential (primary) hypertension: Secondary | ICD-10-CM | POA: Diagnosis not present

## 2012-10-18 DIAGNOSIS — I5022 Chronic systolic (congestive) heart failure: Secondary | ICD-10-CM | POA: Diagnosis not present

## 2012-10-18 DIAGNOSIS — E876 Hypokalemia: Secondary | ICD-10-CM | POA: Diagnosis not present

## 2012-10-20 DIAGNOSIS — F0630 Mood disorder due to known physiological condition, unspecified: Secondary | ICD-10-CM | POA: Diagnosis not present

## 2012-10-27 ENCOUNTER — Encounter (HOSPITAL_COMMUNITY): Payer: Self-pay

## 2012-10-27 ENCOUNTER — Ambulatory Visit (HOSPITAL_COMMUNITY)
Admission: RE | Admit: 2012-10-27 | Discharge: 2012-10-27 | Disposition: A | Discharge status: Home / Self Care | Payer: Medicare Other | Source: Ambulatory Visit | Attending: Internal Medicine | Admitting: Internal Medicine

## 2012-10-27 VITALS — BP 140/84 | HR 100 | Wt 283.1 lb

## 2012-10-27 DIAGNOSIS — I5022 Chronic systolic (congestive) heart failure: Secondary | ICD-10-CM

## 2012-10-27 HISTORY — DX: Chronic systolic (congestive) heart failure: I50.22

## 2012-10-27 MED ORDER — TORSEMIDE 20 MG PO TABS: 40.0000 mg | ORAL_TABLET | Freq: Two times a day (BID) | ORAL | Status: DC

## 2012-10-27 MED ORDER — METOLAZONE 2.5 MG PO TABS: 2.5000 mg | ORAL_TABLET | Freq: Every day | ORAL | Status: DC

## 2012-10-27 MED ORDER — POTASSIUM CHLORIDE CRYS ER 20 MEQ PO TBCR: 20.0000 meq | EXTENDED_RELEASE_TABLET | Freq: Two times a day (BID) | ORAL | Status: DC

## 2012-10-27 NOTE — Assessment & Plan Note (Addendum)
Very difficult situation. Currently NYHA III-IIIB but functional status limited in large part due to her body habitus. Very hard to assess intravascular volume clearly and recently had renal insufficiency with addition of metolazone. We discussed the plan extensively with her and her husband and we will try to increase torsemide to 40 mg twice a day and add metolazone 2.5 mg every Friday. Lengthy discussion regarding daily weights, limiting fluid intake to < 2 liters, and low salt food choices.Check BMET next week. Follow up in 2-3 weeks to reassess volume status and at that time consider adding hydralazine/IMDUR.   I also had lengthy discussion with her about the fact that she would likely die from obesity related complications within 5 years if she did not make a concerted effort to lose weight. Suggested joining [employer] or other structured program to help .

## 2012-10-27 NOTE — Patient Instructions (Addendum)
Follow up in 2-3 weeks  Take 40 mg of Torsemide twice a day  Take KDUR 20 meq twice a day  Take metolazone 2.5 mg every Friday and an extra 40 meq of Potassium   Do the following things EVERYDAY: 1) Weigh yourself in the morning before breakfast. Write it down and keep it in a log. 2) Take your medicines as prescribed 3) Eat low salt foods-Limit salt (sodium) to 2000 mg per day.  4) Stay as active as you can everyday 5) Limit all fluids for the day to less than 2 liters

## 2012-10-31 NOTE — Progress Notes (Signed)
Patient ID: Rhonda [name], female   DOB: [birth year], 62 y.o.   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 She has had sleep study one year ago- no evidence of sleep apnea. She is referred by Dr.Turner for assistance with HF management   10/14/12 ECHO- EF 35% RV ok  She presents as a new patient today with her husband. She has been followed closely by Dr Mayford Knife and the [facility] HF clinic. It has been very difficult to assess and manage her volume status due to her size and dietary noncompliance.   She was last seen by Dr Mayford Knife on 5/27 with recommendations to take Torsemide 20 mg twice a day. She had been taking Metolazone 2.5 mg twice a week but this was stopped due to creatinine increasing to 1.7 on 10/10/12. Complains of exertional dyspnea, + Orthopnea and fatigue. Sleeps on couch with 2 pillows. Denies PND. Weight has gone up over the last 6 months about 20 pounds. Weight at home trending up to 283 from 277 at office visit 10/18/12. On 3 liters oxygen at night due to hypoxia. Drinking > 2 liters per day but eats lots of fruit. Tries to follow low salt diet. Ambulation very limited sue to size and orthopedic issues.   10/18/12 Creatinine 1.3 Potassium 3.5  Review of Systems:     Cardiac Review of Systems: {Y] = yes [ ]  = no  Chest Pain [    ]  Resting SOB [   ] Exertional SOB  [  Y]  Orthopnea [  Y]   Pedal Edema [ Y  ]    Palpitations [  ] Syncope  [  ]   Presyncope [   ]  General Review of Systems: [Y] = yes [  ]=no Constitional: recent weight change  [ Y ]; anorexia [  ]; fatigue [Y  ]; nausea [  ]; night sweats [  ]; fever [  ]; or chills [  ];                                                                                                                                          Dental: poor dentition[  ]; Last Dentist visit:   Eye : blurred vision [  ]; diplopia [   ]; vision changes [  ];  Amaurosis fugax[  ]; Resp: cough [  ];  wheezing[  ];  hemoptysis[  ]; shortness of breath[ Y ];  paroxysmal nocturnal dyspnea[  ]; dyspnea on exertion[ Y ]; or orthopnea[Y  ];  GI:  gallstones[  ], vomiting[  ];  dysphagia[  ]; melena[  ];  hematochezia [  ]; heartburn[  ];   Hx of  Colonoscopy[  ]; GU: kidney stones [  ]; hematuria[  ];   dysuria [  ];  nocturia[  ];  history of     obstruction [  ];                 Skin: rash, swelling[  ];, hair loss[  ];  peripheral edema[  ];  or itching[  ]; Musculosketetal: myalgias[  ];  joint swelling[  ];  joint erythema[  ];  joint pain[  Y];  back pain[  Y];  Heme/Lymph: bruising[  ];  bleeding[  ];  anemia[  ];  Neuro: TIA[  ];  headaches[  ];  stroke[  ];  vertigo[  ];  seizures[  ];   paresthesias[  ];  difficulty walking[  ];  Psych:depression[  ]; anxiety[  ];  Endocrine: diabetes[ Y ];  thyroid dysfunction[  ];  Immunizations: Flu [  ]; Pneumococcal[  ];  Other:    Past Medical History  Diagnosis Date  . Unspecified vitamin D deficiency 03/26/2011    does not take meds  . Asthma   . Diabetes mellitus   . Hypertension   . Heart murmur   . Cardiomyopathy     [name]  . Shortness of breath     WITH EXERTION  . Hypothyroidism   . Anemia   . GERD (gastroesophageal reflux disease)     NEXIUM ONLY IF NEEDED  . Breast cancer, stage 1 03/26/2011    left-FINISHED CHEMO  AND RADIATION  . Arthritis     RIGHT KNEE ARTHRITIS AND PAIN-PT TOLD BONE ON BONE  . Depression   . Back pain     DISK PROBLEM  . Complication of anesthesia     PT STATES HER B/P LOW AFTER ONE OF  HER SURGERIES--SHE ATTRIBUTES TO LYING FLAT  . CHF (congestive heart failure)     CHRONIC SYSTOLIC HEART FAILURE-[facility] NOTE 07/07/11   . Left bundle branch block     CHRONIC PER [facility] NOTES 07/07/11.  . Gout   . SVD (spontaneous vaginal delivery)     x 2  . Hyperlipidemia   . Anxiety   . Headache(784.0)      hx - migraines, last one 3 mos ago, uses otc med prn  . Dry eyes, bilateral   . Neuromuscular disorder     feet -neuropathy    Current Outpatient Prescriptions  Medication Sig Dispense Refill  . albuterol (PROVENTIL HFA;VENTOLIN HFA) 108 (90 BASE) MCG/ACT inhaler Inhale 2 puffs into the lungs every 6 (six) hours as needed. Wheezing       . albuterol (PROVENTIL) (2.5 MG/3ML) 0.083% nebulizer solution Take 2.5 mg by nebulization every 6 (six) hours as needed. Wheezing       . ARIPiprazole (ABILIFY) 2 MG tablet Take 2 mg by mouth every morning.       Marland Kitchen buPROPion (WELLBUTRIN XL) 150 MG 24 hr tablet Take 300 mg by mouth every morning.       . carvedilol (COREG) 3.125 MG tablet Take 3.125 mg by mouth 2 (two) times daily with a meal.      . clonazePAM (KLONOPIN) 0.5 MG tablet Take 0.5-1 mg  by mouth 2 (two) times daily as needed. 2 in morning, 1 at bedtime      . diphenhydrAMINE (BENADRYL) 25 mg capsule Take 1 capsule (25 mg total) by mouth every 6 (six) hours as needed for itching, allergies or sleep.  30 capsule    . docusate sodium 100 MG CAPS Take 100 mg by mouth 2 (two) times daily.  10 capsule    . febuxostat (ULORIC) 40 MG tablet Take 40 mg by mouth at bedtime.       . ferrous sulfate 325 (65 FE) MG tablet Take 1 tablet (325 mg total) by mouth 3 (three) times daily after meals.      . Fluticasone-Salmeterol (ADVAIR) 250-50 MCG/DOSE AEPB Inhale 1 puff into the lungs every 12 (twelve) hours.       . gabapentin (NEURONTIN) 300 MG capsule Take 300 mg by mouth 2 (two) times daily. May take 3 times daily as needed      . levothyroxine (SYNTHROID,  LEVOTHROID) 125 MCG tablet Take 125 mcg by mouth every morning. On an empty stomach      . methocarbamol (ROBAXIN) 500 MG tablet Take 1 tablet (500 mg total) by mouth every 6 (six) hours as needed (muscle spasms).  50 tablet  0  . PARoxetine (PAXIL-CR) 25 MG 24 hr tablet Take 25 mg by mouth 2 (two) times daily.       . polyethylene glycol (MIRALAX / GLYCOLAX) packet Take 17 g by mouth 2 (two) times daily.  14 each    . potassium chloride SA (K-DUR,KLOR-CON) 20 MEQ tablet Take 1 tablet (20 mEq total) by mouth 2 (two) times daily.  60 tablet  3  . simvastatin (ZOCOR) 10 MG tablet Take 10 mg by mouth every evening.       . torsemide (DEMADEX) 20 MG tablet Take 2 tablets (40 mg total) by mouth 2 (two) times daily at 10 AM and 5 PM.  120 tablet  3  . zolpidem (AMBIEN) 10 MG tablet Take 10 mg by mouth at bedtime as needed. For sleep      . metFORMIN (GLUMETZA) 500 MG (MOD) 24 hr tablet Take 500 mg by mouth 2 (two) times daily.      . metolazone (ZAROXOLYN) 2.5 MG tablet Take 1 tablet (2.5 mg total) by mouth daily. Take 2.5 mg every Friday and as needed  8 tablet  3  . Multiple Vitamin (MULTIVITAMIN WITH MINERALS) TABS Take 1 tablet by mouth daily.      . Vitamin D, Ergocalciferol, (DRISDOL) 50000 UNITS CAPS Take 50,000 Units by mouth every Monday.       No current facility-administered medications for this encounter.     Allergies  Allergen Reactions  . Ceftin Anaphylaxis    Face and throat swell   . Shellfish Allergy Other (See Comments)    Gout exacerbation  . Allopurinol Nausea Only and Other (See Comments)    weakness  . Lorazepam Itching  . Sulfa Antibiotics Itching  . Ultram (Tramadol Hcl) Itching    History   Social History  . Marital Status: Married    Spouse Name: N/A    Number of Children: N/A  . Years of Education: N/A   Occupational History  . Not on file.   Social History Main Topics  . Smoking status: Former Smoker -- 1.00 packs/day for 5 years    Types: Cigarettes     Quit date: 05/06/2007  . Smokeless tobacco: Never Used     Comment:  I PP PER WEEK FOR MAYBE 5 YRS-QUIT SMOKING ABOUT 2008  . Alcohol Use: No  . Drug Use: No  . Sexually Active: Yes   Other Topics Concern  . Not on file   Social History Narrative  . No narrative on file    Family History  Problem Relation Age of Onset  . Birth defects Paternal Uncle     PHYSICAL EXAM: Filed Vitals:   10/27/12 1113  BP: 140/84  Pulse: 100   General:  Chronically ill appearing. No respiratory difficulty Husband present HEENT: normal Neck: supple. JVD difficult to assess due to body habitus. . Carotids 2+ bilat; no bruits. No lymphadenopathy or thryomegaly appreciated. Cor: PMI nondisplaced. Regular rate & rhythm. No rubs, gallops or murmurs. Lungs: clear Abdomen: morbidly obese, soft, nontender, nondistended. No bruits or masses. Good bowel sounds. Extremities: no cyanosis, clubbing, rash, R and LLE 3+ edema Neuro: alert & oriented x 3, cranial nerves grossly intact. moves all 4 extremities w/o difficulty. Affect pleasant.    No results found for this or any previous visit (from the past 24 hour(s)). No results found.   ASSESSMENT & PLAN:

## 2012-11-01 NOTE — Assessment & Plan Note (Signed)
BMI 52. As above, strongly encouraged her to lose weight.

## 2012-11-02 DIAGNOSIS — E669 Obesity, unspecified: Secondary | ICD-10-CM | POA: Diagnosis not present

## 2012-11-02 DIAGNOSIS — N183 Chronic kidney disease, stage 3 unspecified: Secondary | ICD-10-CM | POA: Diagnosis not present

## 2012-11-02 DIAGNOSIS — E1129 Type 2 diabetes mellitus with other diabetic kidney complication: Secondary | ICD-10-CM | POA: Diagnosis not present

## 2012-11-02 DIAGNOSIS — I10 Essential (primary) hypertension: Secondary | ICD-10-CM | POA: Diagnosis not present

## 2012-11-02 DIAGNOSIS — I5022 Chronic systolic (congestive) heart failure: Secondary | ICD-10-CM | POA: Diagnosis not present

## 2012-11-02 DIAGNOSIS — E039 Hypothyroidism, unspecified: Secondary | ICD-10-CM | POA: Diagnosis not present

## 2012-11-14 DIAGNOSIS — N950 Postmenopausal bleeding: Secondary | ICD-10-CM | POA: Diagnosis not present

## 2012-11-14 DIAGNOSIS — S91109A Unspecified open wound of unspecified toe(s) without damage to nail, initial encounter: Secondary | ICD-10-CM | POA: Diagnosis not present

## 2012-11-16 ENCOUNTER — Encounter (HOSPITAL_COMMUNITY): Payer: Self-pay

## 2012-11-16 ENCOUNTER — Ambulatory Visit (HOSPITAL_COMMUNITY)
Admission: RE | Admit: 2012-11-16 | Discharge: 2012-11-16 | Disposition: A | Discharge status: Home / Self Care | Payer: Medicare Other | Source: Ambulatory Visit | Attending: Internal Medicine | Admitting: Internal Medicine

## 2012-11-16 ENCOUNTER — Telehealth (HOSPITAL_COMMUNITY): Payer: Self-pay | Admitting: Anesthesiology

## 2012-11-16 VITALS — BP 106/70 | HR 92 | Wt 262.4 lb

## 2012-11-16 DIAGNOSIS — I509 Heart failure, unspecified: Secondary | ICD-10-CM | POA: Insufficient documentation

## 2012-11-16 DIAGNOSIS — F411 Generalized anxiety disorder: Secondary | ICD-10-CM | POA: Diagnosis not present

## 2012-11-16 DIAGNOSIS — Z79899 Other long term (current) drug therapy: Secondary | ICD-10-CM | POA: Diagnosis not present

## 2012-11-16 DIAGNOSIS — J45909 Unspecified asthma, uncomplicated: Secondary | ICD-10-CM | POA: Insufficient documentation

## 2012-11-16 DIAGNOSIS — K219 Gastro-esophageal reflux disease without esophagitis: Secondary | ICD-10-CM | POA: Diagnosis not present

## 2012-11-16 DIAGNOSIS — Z853 Personal history of malignant neoplasm of breast: Secondary | ICD-10-CM | POA: Diagnosis not present

## 2012-11-16 DIAGNOSIS — M1710 Unilateral primary osteoarthritis, unspecified knee: Secondary | ICD-10-CM | POA: Insufficient documentation

## 2012-11-16 DIAGNOSIS — I5022 Chronic systolic (congestive) heart failure: Secondary | ICD-10-CM | POA: Diagnosis not present

## 2012-11-16 DIAGNOSIS — I10 Essential (primary) hypertension: Secondary | ICD-10-CM | POA: Insufficient documentation

## 2012-11-16 DIAGNOSIS — E785 Hyperlipidemia, unspecified: Secondary | ICD-10-CM | POA: Insufficient documentation

## 2012-11-16 DIAGNOSIS — J9620 Acute and chronic respiratory failure, unspecified whether with hypoxia or hypercapnia: Secondary | ICD-10-CM | POA: Insufficient documentation

## 2012-11-16 LAB — BASIC METABOLIC PANEL
BUN: 61 mg/dL — ABNORMAL HIGH (ref 6–23)
CO2: 35 mEq/L — ABNORMAL HIGH (ref 19–32)
Calcium: 9.8 mg/dL (ref 8.4–10.5)
Chloride: 88 mEq/L — ABNORMAL LOW (ref 96–112)
Creatinine, Ser: 1.69 mg/dL — ABNORMAL HIGH (ref 0.50–1.10)
GFR calc Af Amer: 37 mL/min — ABNORMAL LOW (ref >90)
GFR calc non Af Amer: 32 mL/min — ABNORMAL LOW (ref >90)
Glucose, Bld: 219 mg/dL — ABNORMAL HIGH (ref 70–99)
Potassium: 2.7 mEq/L — CL (ref 3.5–5.1)
Sodium: 138 mEq/L (ref 135–145)

## 2012-11-16 MED ORDER — TORSEMIDE 20 MG PO TABS: 40.0000 mg | ORAL_TABLET | Freq: Every day | ORAL | Status: DC

## 2012-11-16 MED ORDER — SPIRONOLACTONE 25 MG PO TABS: 12.5000 mg | ORAL_TABLET | Freq: Every day | ORAL | Status: DC

## 2012-11-16 NOTE — Patient Instructions (Addendum)
Will call with lab results.  Continue medications as prescribed.  Try to start walking and exercising more.  Follow up 1 month.  Do the following things EVERYDAY: 1) Weigh yourself in the morning before breakfast. Write it down and keep it in a log. 2) Take your medicines as prescribed 3) Eat low salt foods-Limit salt (sodium) to 2000 mg per day.  4) Stay as active as you can everyday 5) Limit all fluids for the day to less than 2 liters 6)

## 2012-11-16 NOTE — Progress Notes (Signed)
Patient ID: Rhonda [name], female   DOB: [birth year], 62 y.o.   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 She has had sleep study one year ago- no evidence of sleep apnea. She is referred by Dr.Turner for assistance with HF management   10/14/12 ECHO- EF 35% RV ok  10/18/12 Creatinine 1.3 Potassium 3.5  Follow up: Last visit increased torsemide to 40 mg BID and metolazone 2.5 mg q Friday. Saw Dr. Clyde Canterbury this week and BP 97/70 and was concerned that maybe dehydrated. Saw another MD later that afternoon and BP 120/80. Complaints of positional dizziness, however she reports she has always had this issue. Minimal distances 200 ft cause SOB. Wears 3L O2 at night. Denies orthopnea or weight gain. Weight down 20 lbs.   ROS: All systems negative except as listed in HPI, PMH and Problem List.  Past Medical History  Diagnosis Date  . Unspecified vitamin D deficiency 03/26/2011    does not take meds  . Asthma   . Diabetes mellitus   . Hypertension   . Heart murmur   . Cardiomyopathy     [name]  . Shortness of breath     WITH EXERTION  . Hypothyroidism   . Anemia   . GERD (gastroesophageal reflux disease)     NEXIUM ONLY IF NEEDED  . Breast cancer, stage 1 03/26/2011    left-FINISHED CHEMO  AND RADIATION  . Arthritis     RIGHT KNEE ARTHRITIS AND PAIN-PT TOLD BONE ON BONE  . Depression   . Back pain     DISK PROBLEM  . Complication of anesthesia     PT STATES HER B/P LOW AFTER ONE OF HER  SURGERIES--SHE ATTRIBUTES TO LYING FLAT  . CHF (congestive heart failure)     CHRONIC SYSTOLIC HEART FAILURE-[facility] 07/07/11   . Left bundle branch block     CHRONIC [facility] NOTES 07/07/11.  . Gout   . SVD (spontaneous vaginal delivery)     x 2  . Hyperlipidemia   . Anxiety   . Headache(784.0)      hx - migraines, last one 3 mos ago, uses otc med prn  . Dry eyes, bilateral   . Neuromuscular disorder     feet -neuropathy    Current Outpatient Prescriptions  Medication Sig Dispense Refill  . albuterol (PROVENTIL HFA;VENTOLIN HFA) 108 (90 BASE) MCG/ACT inhaler Inhale 2 puffs into the lungs every 6 (six) hours as needed. Wheezing       . albuterol (PROVENTIL) (2.5 MG/3ML) 0.083% nebulizer solution Take 2.5 mg by nebulization every 6 (six) hours as needed. Wheezing       . ARIPiprazole (ABILIFY) 2 MG tablet Take 2 mg by mouth every morning.       Marland Kitchen buPROPion (WELLBUTRIN XL) 150 MG 24 hr tablet Take 300 mg by mouth every morning.       . carvedilol (COREG) 3.125 MG tablet Take 3.125 mg by  mouth 2 (two) times daily with a meal.      . clonazePAM (KLONOPIN) 0.5 MG tablet Take 0.5-1 mg by mouth 2 (two) times daily as needed. 2 in morning, 1 at bedtime      . diphenhydrAMINE (BENADRYL) 25 mg capsule Take 1 capsule (25 mg total) by mouth every 6 (six) hours as needed for itching, allergies or sleep.  30 capsule    . docusate sodium 100 MG CAPS Take 100 mg by mouth 2 (two) times daily.  10 capsule    . febuxostat (ULORIC) 40 MG tablet Take 40 mg by mouth at bedtime.       . ferrous sulfate 325 (65 FE) MG tablet Take 1 tablet (325 mg total) by mouth 3 (three) times daily after meals.      . Fluticasone-Salmeterol (ADVAIR) 250-50 MCG/DOSE AEPB Inhale 1 puff into the lungs every 12 (twelve) hours.       . gabapentin (NEURONTIN) 300 MG capsule Take 300 mg by mouth 2 (two) times daily. May take 3 times daily as needed      . levothyroxine (SYNTHROID,  LEVOTHROID) 125 MCG tablet Take 125 mcg by mouth every morning. On an empty stomach      . metFORMIN (GLUMETZA) 500 MG (MOD) 24 hr tablet Take 500 mg by mouth 2 (two) times daily.      . methocarbamol (ROBAXIN) 500 MG tablet Take 1 tablet (500 mg total) by mouth every 6 (six) hours as needed (muscle spasms).  50 tablet  0  . metolazone (ZAROXOLYN) 2.5 MG tablet Take 1 tablet (2.5 mg total) by mouth daily. Take 2.5 mg every Friday and as needed  8 tablet  3  . Multiple Vitamin (MULTIVITAMIN WITH MINERALS) TABS Take 1 tablet by mouth daily.      Marland Kitchen PARoxetine (PAXIL-CR) 25 MG 24 hr tablet Take 25 mg by mouth 2 (two) times daily.       . polyethylene glycol (MIRALAX / GLYCOLAX) packet Take 17 g by mouth 2 (two) times daily.  14 each    . potassium chloride SA (K-DUR,KLOR-CON) 20 MEQ tablet Take 1 tablet (20 mEq total) by mouth 2 (two) times daily.  60 tablet  3  . simvastatin (ZOCOR) 10 MG tablet Take 10 mg by mouth every evening.       . torsemide (DEMADEX) 20 MG tablet Take 2 tablets (40 mg total) by mouth 2 (two) times daily at 10 AM and 5 PM.  120 tablet  3  . Vitamin D, Ergocalciferol, (DRISDOL) 50000 UNITS CAPS Take 50,000 Units by mouth every Monday.      . zolpidem (AMBIEN) 10 MG tablet Take 10 mg by mouth at bedtime as needed. For sleep       No current facility-administered medications for this encounter.     PHYSICAL EXAM: Filed Vitals:   11/16/12 1004  BP: 106/70  Pulse: 92  Weight: 262 lb 6.4 oz (119.024 kg)  SpO2: 98%  Last weight: 282  General:  Chronically ill appearing; No resp difficulty; Husband present HEENT: normal Neck: supple. JVP difficult to asses due to body habitus; Carotids 2+ bilaterally; no bruits. No lymphadenopathy or thryomegaly appreciated. Cor: PMI normal. Regular rate & rhythm. No rubs, gallops or murmurs. Lungs: clear Abdomen: morbidly obese, soft, nontender, nondistended. No hepatosplenomegaly. No bruits or masses. Good bowel sounds. Extremities: no  cyanosis, clubbing, rash, 1+ LE edema Neuro: alert & orientedx3, cranial nerves grossly intact. Moves all 4 extremities w/o difficulty. Affect pleasant.  ASSESSMENT & PLAN:

## 2012-11-16 NOTE — Telephone Encounter (Signed)
Cr. 1.6 and K+ 2.7. Told patient to stop taking metolazone on Fridays. Cut back torsemide 40 mg daily. Take extra 80 meq of K+ today and start Spiro 12.5 mg daily. Follow up in 1 week and if notice weight increasing call the clinic. Patient's husband and wife both on the phone and stated they understood.

## 2012-11-16 NOTE — Assessment & Plan Note (Signed)
Patient's weight is down 20 lbs. NYHA III. Volume status hard to determine d/t body habitus. Will draw BMET today to try to assess whether patient is dry. Will not titrate BB at this time BP soft, will try to increase to 6.25 mg BID next visit or add hydralazine/nitrates. Discussion about trying to exercise and lose weight. Reinforced daily weights, low salt diet and fluid restriction to less than 2L. Follow up in 1 month.

## 2012-11-23 ENCOUNTER — Encounter (HOSPITAL_COMMUNITY): Payer: Self-pay

## 2012-11-23 ENCOUNTER — Ambulatory Visit (HOSPITAL_COMMUNITY)
Admission: RE | Admit: 2012-11-23 | Discharge: 2012-11-23 | Disposition: A | Discharge status: Home / Self Care | Payer: Medicare Other | Source: Ambulatory Visit | Attending: Internal Medicine | Admitting: Internal Medicine

## 2012-11-23 VITALS — BP 100/66 | HR 94 | Wt 270.8 lb

## 2012-11-23 DIAGNOSIS — I5022 Chronic systolic (congestive) heart failure: Secondary | ICD-10-CM | POA: Diagnosis not present

## 2012-11-23 LAB — BASIC METABOLIC PANEL
BUN: 32 mg/dL — ABNORMAL HIGH (ref 6–23)
CO2: 30 mEq/L (ref 19–32)
Calcium: 9.3 mg/dL (ref 8.4–10.5)
Chloride: 98 mEq/L (ref 96–112)
Creatinine, Ser: 1.23 mg/dL — ABNORMAL HIGH (ref 0.50–1.10)
GFR calc Af Amer: 54 mL/min — ABNORMAL LOW (ref >90)
GFR calc non Af Amer: 47 mL/min — ABNORMAL LOW (ref >90)
Glucose, Bld: 128 mg/dL — ABNORMAL HIGH (ref 70–99)
Potassium: 4.1 mEq/L (ref 3.5–5.1)
Sodium: 137 mEq/L (ref 135–145)

## 2012-11-23 MED ORDER — TORSEMIDE 20 MG PO TABS: 40.0000 mg | ORAL_TABLET | Freq: Two times a day (BID) | ORAL | Status: DC

## 2012-11-23 MED ORDER — CARVEDILOL 3.125 MG PO TABS: ORAL_TABLET | ORAL | Status: DC

## 2012-11-23 NOTE — Patient Instructions (Addendum)
Increase demadex back to 40 mg twice a day.  Increase coreg to 1 tablet (3.125 mg) in the morning and 2 tablets (6.25 mg) in the pm.  Will call with lab results.  Follow up 3 weeks.  Call [phone] if your weight starts going up.  Do the following things EVERYDAY: 1) Weigh yourself in the morning before breakfast. Write it down and keep it in a log. 2) Take your medicines as prescribed 3) Eat low salt foods-Limit salt (sodium) to 2000 mg per day.  4) Stay as active as you can everyday 5) Limit all fluids for the day to less than 2 liters 6)

## 2012-11-24 NOTE — Progress Notes (Signed)
Patient ID: Rhonda [name], female   DOB: December 24, 1950, 62 y.o.   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 She has had sleep study one year ago- no evidence of sleep apnea. She is referred by Dr.Turner for assistance with HF management   10/14/12 ECHO- EF 35% RV ok  10/18/12 Creatinine 1.3 Potassium 3.5  Follow up:  Last visit Cr bump so she stopped taking metolazone on Fridays and cut back torsemide from 40 mg BID to 40 mg daily. She started 12.5 mg of spironolactone as well and has been tolerating. Reports weight gain of about 8 lbs. Denies SOB, orthopnea, PND or CP. No dizziness. Taking medications as prescribed.    ROS: All systems negative except as listed in HPI, PMH and Problem List.  Past Medical History  Diagnosis Date  . Unspecified vitamin D deficiency 03/26/2011    does not take meds  . Asthma   . Diabetes mellitus   . Hypertension   . Heart murmur   . Cardiomyopathy     [name]  . Shortness of breath     WITH EXERTION  . Hypothyroidism   . Anemia   . GERD (gastroesophageal reflux disease)     NEXIUM ONLY IF NEEDED  . Breast cancer, stage 1 03/26/2011    left-FINISHED CHEMO  AND RADIATION  . Arthritis     RIGHT KNEE ARTHRITIS AND PAIN-PT TOLD BONE ON BONE  . Depression   . Back pain     DISK PROBLEM  . Complication of anesthesia     PT STATES HER B/P LOW AFTER ONE OF HER SURGERIES--SHE ATTRIBUTES TO LYING FLAT  . CHF (congestive heart failure)     CHRONIC SYSTOLIC  HEART FAILURE-[facility] NOTE 07/07/11   . Left bundle branch block     CHRONIC PER [facility] NOTES 07/07/11.  . Gout   . SVD (spontaneous vaginal delivery)     x 2  . Hyperlipidemia   . Anxiety   . Headache(784.0)      hx - migraines, last one 3 mos ago, uses otc med prn  . Dry eyes, bilateral   . Neuromuscular disorder     feet -neuropathy    Current Outpatient Prescriptions  Medication Sig Dispense Refill  . albuterol (PROVENTIL HFA;VENTOLIN HFA) 108 (90 BASE) MCG/ACT inhaler Inhale 2 puffs into the lungs every 6 (six) hours as needed. Wheezing       . albuterol (PROVENTIL) (2.5 MG/3ML) 0.083% nebulizer solution Take 2.5 mg by nebulization every 6 (six) hours as needed. Wheezing       . ARIPiprazole (ABILIFY) 2 MG tablet Take 2 mg by mouth every morning.       Marland Kitchen buPROPion (WELLBUTRIN XL) 150 MG 24 hr tablet Take 300 mg by mouth every morning.       . carvedilol (COREG) 3.125 MG tablet Take 1 tablet in the am and 2 tablets in the pm  90 tablet  3  .  clonazePAM (KLONOPIN) 0.5 MG tablet Take 0.5-1 mg by mouth 2 (two) times daily as needed. 2 in morning, 1 at bedtime      . diphenhydrAMINE (BENADRYL) 25 mg capsule Take 1 capsule (25 mg total) by mouth every 6 (six) hours as needed for itching, allergies or sleep.  30 capsule    . docusate sodium 100 MG CAPS Take 100 mg by mouth 2 (two) times daily.  10 capsule    . febuxostat (ULORIC) 40 MG tablet Take 40 mg by mouth at bedtime.       . ferrous sulfate 325 (65 FE) MG tablet Take 1 tablet (325 mg total) by mouth 3 (three) times daily after meals.      . Fluticasone-Salmeterol (ADVAIR) 250-50 MCG/DOSE AEPB Inhale 1 puff into the lungs every 12 (twelve) hours.       . gabapentin (NEURONTIN) 300 MG capsule Take 300 mg by mouth 2 (two) times daily. May take 3 times daily as needed      . levothyroxine (SYNTHROID, LEVOTHROID) 125 MCG tablet Take 125 mcg by mouth every morning. On an empty stomach      . metFORMIN  (GLUMETZA) 500 MG (MOD) 24 hr tablet Take 500 mg by mouth 2 (two) times daily.      . methocarbamol (ROBAXIN) 500 MG tablet Take 1 tablet (500 mg total) by mouth every 6 (six) hours as needed (muscle spasms).  50 tablet  0  . Multiple Vitamin (MULTIVITAMIN WITH MINERALS) TABS Take 1 tablet by mouth daily.      Marland Kitchen PARoxetine (PAXIL-CR) 25 MG 24 hr tablet Take 25 mg by mouth 2 (two) times daily.       . polyethylene glycol (MIRALAX / GLYCOLAX) packet Take 17 g by mouth 2 (two) times daily.  14 each    . potassium chloride SA (K-DUR,KLOR-CON) 20 MEQ tablet Take 1 tablet (20 mEq total) by mouth 2 (two) times daily.  60 tablet  3  . simvastatin (ZOCOR) 10 MG tablet Take 10 mg by mouth every evening.       Marland Kitchen spironolactone (ALDACTONE) 25 MG tablet Take 0.5 tablets (12.5 mg total) by mouth daily.  30 tablet  3  . torsemide (DEMADEX) 20 MG tablet Take 2 tablets (40 mg total) by mouth 2 (two) times daily.  120 tablet  3  . Vitamin D, Ergocalciferol, (DRISDOL) 50000 UNITS CAPS Take 50,000 Units by mouth every Monday.      . zolpidem (AMBIEN) 10 MG tablet Take 10 mg by mouth at bedtime as needed. For sleep       No current facility-administered medications for this encounter.     PHYSICAL EXAM: Filed Vitals:   11/23/12 1146  BP: 100/66  Pulse: 94  Weight: 270 lb 12.8 oz (122.834 kg)  SpO2: 100%  Last weight: 262  General:  Well appearing; No resp difficulty; Husband present HEENT: normal Neck: supple. JVP difficult to asses due to body habitus - appears up; Carotids 2+ bilaterally; no bruits. No lymphadenopathy or thryomegaly appreciated. Cor: PMI nonpalpable. Regular rate & rhythm. No rubs, gallops or murmurs. Lungs: clear Abdomen: morbidly obese, soft, nontender, nondistended. No hepatosplenomegaly. No bruits or masses. Good bowel sounds. Extremities: no cyanosis, clubbing, rash, L 1+ LE edema, RLE 2+ Neuro: alert & orientedx3, cranial nerves grossly intact. Moves all 4 extremities w/o  difficulty. Affect pleasant.  ASSESSMENT & PLAN:

## 2012-11-24 NOTE — Assessment & Plan Note (Addendum)
NYHA II-III. Volume status is up. Will increase demadex back to 40 mg twice a day. Reinforced need for dietary compliance, daily weights and reviewed use of sliding scale diuretics. Increase coreg to 1 tablet (3.125 mg) in the morning and 2 tablets (6.25 mg) in the pm. Check labs. BP soft so will not add ACE at this point will try to add in near future. Need to start thinking about ICD candidacy.

## 2012-12-01 DIAGNOSIS — N950 Postmenopausal bleeding: Secondary | ICD-10-CM | POA: Diagnosis not present

## 2012-12-07 ENCOUNTER — Other Ambulatory Visit: Payer: Self-pay | Admitting: Gynecology

## 2012-12-07 DIAGNOSIS — N840 Polyp of corpus uteri: Secondary | ICD-10-CM | POA: Diagnosis not present

## 2012-12-07 DIAGNOSIS — N950 Postmenopausal bleeding: Secondary | ICD-10-CM | POA: Diagnosis not present

## 2012-12-08 DIAGNOSIS — H04129 Dry eye syndrome of unspecified lacrimal gland: Secondary | ICD-10-CM | POA: Diagnosis not present

## 2012-12-08 DIAGNOSIS — H16149 Punctate keratitis, unspecified eye: Secondary | ICD-10-CM | POA: Diagnosis not present

## 2012-12-14 DIAGNOSIS — N950 Postmenopausal bleeding: Secondary | ICD-10-CM | POA: Diagnosis not present

## 2012-12-15 ENCOUNTER — Telehealth (HOSPITAL_COMMUNITY): Payer: Self-pay | Admitting: Anesthesiology

## 2012-12-15 ENCOUNTER — Encounter (HOSPITAL_COMMUNITY): Payer: Self-pay

## 2012-12-15 ENCOUNTER — Ambulatory Visit (HOSPITAL_COMMUNITY)
Admission: RE | Admit: 2012-12-15 | Discharge: 2012-12-15 | Disposition: A | Discharge status: Home / Self Care | Payer: Medicare Other | Source: Ambulatory Visit | Attending: Cardiology | Admitting: Cardiology

## 2012-12-15 VITALS — BP 118/72 | HR 95 | Wt 252.5 lb

## 2012-12-15 DIAGNOSIS — I509 Heart failure, unspecified: Secondary | ICD-10-CM | POA: Insufficient documentation

## 2012-12-15 DIAGNOSIS — E669 Obesity, unspecified: Secondary | ICD-10-CM | POA: Diagnosis not present

## 2012-12-15 DIAGNOSIS — E782 Mixed hyperlipidemia: Secondary | ICD-10-CM | POA: Diagnosis not present

## 2012-12-15 DIAGNOSIS — R011 Cardiac murmur, unspecified: Secondary | ICD-10-CM | POA: Diagnosis not present

## 2012-12-15 DIAGNOSIS — I428 Other cardiomyopathies: Secondary | ICD-10-CM | POA: Diagnosis not present

## 2012-12-15 DIAGNOSIS — I5022 Chronic systolic (congestive) heart failure: Secondary | ICD-10-CM | POA: Insufficient documentation

## 2012-12-15 DIAGNOSIS — I10 Essential (primary) hypertension: Secondary | ICD-10-CM | POA: Insufficient documentation

## 2012-12-15 DIAGNOSIS — E119 Type 2 diabetes mellitus without complications: Secondary | ICD-10-CM | POA: Insufficient documentation

## 2012-12-15 DIAGNOSIS — N183 Chronic kidney disease, stage 3 unspecified: Secondary | ICD-10-CM | POA: Diagnosis not present

## 2012-12-15 DIAGNOSIS — E1129 Type 2 diabetes mellitus with other diabetic kidney complication: Secondary | ICD-10-CM | POA: Diagnosis not present

## 2012-12-15 DIAGNOSIS — D649 Anemia, unspecified: Secondary | ICD-10-CM | POA: Diagnosis not present

## 2012-12-15 DIAGNOSIS — R413 Other amnesia: Secondary | ICD-10-CM | POA: Diagnosis not present

## 2012-12-15 MED ORDER — TORSEMIDE 20 MG PO TABS: ORAL_TABLET | ORAL | Status: DC

## 2012-12-15 NOTE — Telephone Encounter (Signed)
Critical lab result called from [facility] PCP, BUN elevated to 60 awaiting other labs. Already cut torsemide back at appt today to 40/20. Told to hold night time dose diuretics and to only take 40 mg tosemide tomorrow. Will go back to 40/20 on Saturday. Recheck labs on Thursday at [place].

## 2012-12-15 NOTE — Progress Notes (Signed)
Patient ID: NOYA SANTARELLI, female   DOB: February 10, 1951, 62 y.o.   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 She has had sleep study one year ago- no evidence of sleep apnea. She is referred by Dr.Turner for assistance with HF management   10/14/12 ECHO- EF 35% RV ok  10/18/12 Creatinine 1.3 Potassium 3.5 11/16/12 Creatinine 1.69 Potassium 2.7  11/24/12 Creatinine 1.23 Potassium 4.1  Follow up:  Last visit increased torsemide back to 40 mg BID and increased coreg to 3.125/6.25. Weight is down 18 lbs. Reports she is very drowsy and sleepy. She is so fatigued she nods off and even fell off the couch. Taking medications as prescribed. Denies SOB, orthopnea, or CP. + Dizziness when she gets up too fast. Weighing daily. Following a low salt diet.   ROS: All systems negative except as listed in HPI, PMH and Problem List.  Past Medical History  Diagnosis Date  . Unspecified vitamin D deficiency 03/26/2011    does not take meds  . Asthma   . Diabetes mellitus   . Hypertension   . Heart murmur   . Cardiomyopathy     [name]  . Shortness of breath     WITH EXERTION  . Hypothyroidism   . Anemia   . GERD (gastroesophageal reflux disease)     NEXIUM ONLY IF NEEDED  . Breast cancer, stage 1 03/26/2011    left-FINISHED CHEMO  AND RADIATION  . Arthritis     RIGHT KNEE ARTHRITIS AND PAIN-PT TOLD BONE ON BONE  . Depression   . Back pain     DISK PROBLEM  . Complication of anesthesia     PT STATES HER  B/P LOW AFTER ONE OF HER SURGERIES--SHE ATTRIBUTES TO LYING FLAT  . CHF (congestive heart failure)     CHRONIC SYSTOLIC HEART FAILURE[facility] 07/07/11   . Left bundle branch block     CHRONIC [facility] NOTES 07/07/11.  . Gout   . SVD (spontaneous vaginal delivery)     x 2  . Hyperlipidemia   . Anxiety   . Headache(784.0)      hx - migraines, last one 3 mos ago, uses otc med prn  . Dry eyes, bilateral   . Neuromuscular disorder     feet -neuropathy    Current Outpatient Prescriptions  Medication Sig Dispense Refill  . albuterol (PROVENTIL HFA;VENTOLIN HFA) 108 (90 BASE) MCG/ACT inhaler Inhale 2 puffs into the lungs every 6 (six) hours as needed. Wheezing       . albuterol (PROVENTIL) (2.5 MG/3ML) 0.083% nebulizer solution Take 2.5 mg by nebulization every 6 (six) hours as needed. Wheezing       . ARIPiprazole (ABILIFY) 2 MG tablet Take 2 mg by mouth every morning.       Marland Kitchen buPROPion (WELLBUTRIN XL) 150 MG 24 hr tablet Take 300 mg by mouth every morning.       . carvedilol (COREG) 3.125 MG  tablet Take 1 tablet in the am and 2 tablets in the pm  90 tablet  3  . clonazePAM (KLONOPIN) 0.5 MG tablet Take 0.5 mg by mouth 2 (two) times daily as needed. 1 in morning, 1 at bedtime      . docusate sodium 100 MG CAPS Take 100 mg by mouth 2 (two) times daily.  10 capsule    . febuxostat (ULORIC) 40 MG tablet Take 40 mg by mouth at bedtime.       . ferrous sulfate 325 (65 FE) MG tablet Take 1 tablet (325 mg total) by mouth 3 (three) times daily after meals.      . Fluticasone-Salmeterol (ADVAIR) 250-50 MCG/DOSE AEPB Inhale 1 puff into the lungs every 12 (twelve) hours.       . gabapentin (NEURONTIN) 300 MG capsule Take 300 mg by mouth 2 (two) times daily. May take 3 times daily as needed      . levothyroxine (SYNTHROID, LEVOTHROID) 125 MCG tablet Take 125 mcg by mouth every morning. On an empty stomach      . methocarbamol (ROBAXIN) 500 MG tablet Take 1  tablet (500 mg total) by mouth every 6 (six) hours as needed (muscle spasms).  50 tablet  0  . PARoxetine (PAXIL-CR) 25 MG 24 hr tablet Take 25 mg by mouth 2 (two) times daily.       . polyethylene glycol (MIRALAX / GLYCOLAX) packet Take 17 g by mouth 2 (two) times daily.  14 each    . potassium chloride SA (K-DUR,KLOR-CON) 20 MEQ tablet Take 1 tablet (20 mEq total) by mouth 2 (two) times daily.  60 tablet  3  . simvastatin (ZOCOR) 10 MG tablet Take 10 mg by mouth every evening.       Marland Kitchen spironolactone (ALDACTONE) 25 MG tablet Take 0.5 tablets (12.5 mg total) by mouth daily.  30 tablet  3  . torsemide (DEMADEX) 20 MG tablet Take 2 tablets (40 mg total) by mouth 2 (two) times daily.  120 tablet  3  . zolpidem (AMBIEN) 10 MG tablet Take 10 mg by mouth at bedtime as needed. For sleep       No current facility-administered medications for this encounter.     PHYSICAL EXAM: Filed Vitals:   12/15/12 1326  BP: 118/72  Pulse: 95  Weight: 252 lb 8 oz (114.533 kg)  SpO2: 99%  Last weight: 270  General:  Fatigue appearing; No resp difficulty; Husband present HEENT: normal Neck: supple. JVP difficult to asses due to body habitus - appears dry; Carotids 2+ bilaterally; no bruits. No lymphadenopathy or thryomegaly appreciated. Cor: PMI nonpalpable. Regular rate & rhythm. No rubs, gallops or murmurs. Lungs: clear Abdomen: morbidly obese, soft, nontender, nondistended. No hepatosplenomegaly. No bruits or masses. Good bowel sounds. Extremities: no cyanosis, clubbing, rash, trace edema Neuro: alert & orientedx3, cranial nerves grossly intact. Moves all 4 extremities w/o difficulty. Affect pleasant.  ASSESSMENT & PLAN:  1) Chronic systolic HF, EF 35% - Patient is down 18 lbs since last visit. She appears dry. Will cut back torsemide to 40/20. Instructed patient to call if weight goes above 262.  - Awaiting lab results from Dr. Baldwin [place] office. - SBP 118 would like to titrate coreg, however with  her increased fatigue and drowsiness will hold off. Fatigue and drowsiness could be related to the other medications she is currently taking. Would like to try to titrate BB up or add ACE-I next time. - Will follow up in 1 month.  Will need follow up ECHO after next visit to discuss ICD candidacy.  2) Obesity - Discussed with patient the need to start exercising and trying to lose weight. Told her that she could look into Silver Sneakers at the [facility].   Ulla Potash B NP-C 2:27 PM

## 2012-12-15 NOTE — Patient Instructions (Addendum)
Change your torsemide to 40 mg (2 tablets) in the morning and 20 mg (1 tablet) in the pm.  If your weight goes above 262 lbs give [place] a call.  Will follow up in 1 month.  Start exercising.  Do the following things EVERYDAY: 1) Weigh yourself in the morning before breakfast. Write it down and keep it in a log. 2) Take your medicines as prescribed 3) Eat low salt foods-Limit salt (sodium) to 2000 mg per day.  4) Stay as active as you can everyday 5) Limit all fluids for the day to less than 2 liters 6)

## 2012-12-15 NOTE — Addendum Note (Signed)
Encounter addended by: Noralee Space, RN on: 12/15/2012  4:23 PM<BR>     Documentation filed: Orders

## 2012-12-15 NOTE — Telephone Encounter (Signed)
Received all labs from [facility] and BUN 60, Cr 2.03 and K+ 3.3. Previous Cr 1.23 and BUN 32 on 11/23/12. Instructed patient to hold all diuretics until Sunday and then restart 40/20. Instructed to take 40 meq K+ today and not take again until she takes her diuretics and then go back to 20 meq BID. Will recheck next Thursday. Patient reports she understands. Will follow up with her Monday.

## 2012-12-19 ENCOUNTER — Other Ambulatory Visit: Payer: Self-pay | Admitting: Geriatric Medicine

## 2012-12-19 MED ORDER — CARVEDILOL 3.125 MG PO TABS: ORAL_TABLET | ORAL | Status: DC

## 2012-12-20 NOTE — Addendum Note (Signed)
Encounter addended by: Aundria Rud, NP on: 12/20/2012 12:10 PM<BR>     Documentation filed: Follow-up Section, LOS Section, Patient Instructions Section

## 2012-12-21 ENCOUNTER — Ambulatory Visit (HOSPITAL_COMMUNITY): Payer: Medicare Other

## 2012-12-22 ENCOUNTER — Telehealth (HOSPITAL_COMMUNITY): Payer: Self-pay | Admitting: Anesthesiology

## 2012-12-22 ENCOUNTER — Ambulatory Visit (INDEPENDENT_AMBULATORY_CARE_PROVIDER_SITE_OTHER): Payer: Medicare Other | Admitting: *Deleted

## 2012-12-22 ENCOUNTER — Telehealth (HOSPITAL_COMMUNITY): Payer: Self-pay | Admitting: *Deleted

## 2012-12-22 DIAGNOSIS — I5022 Chronic systolic (congestive) heart failure: Secondary | ICD-10-CM | POA: Diagnosis not present

## 2012-12-22 LAB — BASIC METABOLIC PANEL
BUN: 24 mg/dL — ABNORMAL HIGH (ref 6–23)
CO2: 31 mEq/L (ref 19–32)
Calcium: 8.9 mg/dL (ref 8.4–10.5)
Chloride: 95 mEq/L — ABNORMAL LOW (ref 96–112)
Creatinine, Ser: 1.2 mg/dL (ref 0.4–1.2)
GFR: 57.26 mL/min — ABNORMAL LOW (ref >60.00)
Glucose, Bld: 109 mg/dL — ABNORMAL HIGH (ref 70–99)
Potassium: 3.8 mEq/L (ref 3.5–5.1)
Sodium: 138 mEq/L (ref 135–145)

## 2012-12-22 NOTE — Telephone Encounter (Signed)
Received lab results today and Cr is back to baseline 1.2 and BUN 24. K + 3.8. Weight currently 258 lbs on her scale. Instructed her that her new baseline weight is around 257-260 and if she goes above 260 lbs to call the office. Continue demadex 40/20. Will follow up next visit.

## 2012-12-22 NOTE — Telephone Encounter (Signed)
Pt. Called about her weight increase 9 lb. Pt. Was instructed to call if weight increased. Called pt. Back, no answer, left message to return my call.

## 2012-12-28 DIAGNOSIS — M25569 Pain in unspecified knee: Secondary | ICD-10-CM | POA: Diagnosis not present

## 2013-01-03 DIAGNOSIS — M25569 Pain in unspecified knee: Secondary | ICD-10-CM | POA: Diagnosis not present

## 2013-01-04 DIAGNOSIS — M25569 Pain in unspecified knee: Secondary | ICD-10-CM | POA: Diagnosis not present

## 2013-01-06 DIAGNOSIS — M25569 Pain in unspecified knee: Secondary | ICD-10-CM | POA: Diagnosis not present

## 2013-01-10 DIAGNOSIS — M25569 Pain in unspecified knee: Secondary | ICD-10-CM | POA: Diagnosis not present

## 2013-01-11 DIAGNOSIS — M25569 Pain in unspecified knee: Secondary | ICD-10-CM | POA: Diagnosis not present

## 2013-01-12 DIAGNOSIS — F0630 Mood disorder due to known physiological condition, unspecified: Secondary | ICD-10-CM | POA: Diagnosis not present

## 2013-01-17 DIAGNOSIS — M25569 Pain in unspecified knee: Secondary | ICD-10-CM | POA: Diagnosis not present

## 2013-01-18 ENCOUNTER — Ambulatory Visit (HOSPITAL_COMMUNITY): Payer: Medicare Other

## 2013-01-18 DIAGNOSIS — M25569 Pain in unspecified knee: Secondary | ICD-10-CM | POA: Diagnosis not present

## 2013-01-20 DIAGNOSIS — M25569 Pain in unspecified knee: Secondary | ICD-10-CM | POA: Diagnosis not present

## 2013-01-24 DIAGNOSIS — M25569 Pain in unspecified knee: Secondary | ICD-10-CM | POA: Diagnosis not present

## 2013-01-25 ENCOUNTER — Ambulatory Visit (HOSPITAL_COMMUNITY)
Admission: RE | Admit: 2013-01-25 | Discharge: 2013-01-25 | Disposition: A | Discharge status: Home / Self Care | Payer: Medicare Other | Source: Ambulatory Visit | Attending: Internal Medicine | Admitting: Internal Medicine

## 2013-01-25 ENCOUNTER — Encounter (HOSPITAL_COMMUNITY): Payer: Self-pay

## 2013-01-25 VITALS — BP 124/76 | HR 93 | Wt 257.1 lb

## 2013-01-25 DIAGNOSIS — Z853 Personal history of malignant neoplasm of breast: Secondary | ICD-10-CM | POA: Insufficient documentation

## 2013-01-25 DIAGNOSIS — I428 Other cardiomyopathies: Secondary | ICD-10-CM | POA: Diagnosis not present

## 2013-01-25 DIAGNOSIS — E785 Hyperlipidemia, unspecified: Secondary | ICD-10-CM | POA: Diagnosis not present

## 2013-01-25 DIAGNOSIS — M1710 Unilateral primary osteoarthritis, unspecified knee: Secondary | ICD-10-CM | POA: Diagnosis not present

## 2013-01-25 DIAGNOSIS — E559 Vitamin D deficiency, unspecified: Secondary | ICD-10-CM | POA: Diagnosis not present

## 2013-01-25 DIAGNOSIS — Z79899 Other long term (current) drug therapy: Secondary | ICD-10-CM | POA: Insufficient documentation

## 2013-01-25 DIAGNOSIS — I5022 Chronic systolic (congestive) heart failure: Secondary | ICD-10-CM

## 2013-01-25 DIAGNOSIS — I509 Heart failure, unspecified: Secondary | ICD-10-CM | POA: Diagnosis not present

## 2013-01-25 DIAGNOSIS — I447 Left bundle-branch block, unspecified: Secondary | ICD-10-CM | POA: Diagnosis not present

## 2013-01-25 DIAGNOSIS — I10 Essential (primary) hypertension: Secondary | ICD-10-CM | POA: Diagnosis not present

## 2013-01-25 DIAGNOSIS — H04129 Dry eye syndrome of unspecified lacrimal gland: Secondary | ICD-10-CM | POA: Insufficient documentation

## 2013-01-25 DIAGNOSIS — G5790 Unspecified mononeuropathy of unspecified lower limb: Secondary | ICD-10-CM | POA: Insufficient documentation

## 2013-01-25 DIAGNOSIS — F3289 Other specified depressive episodes: Secondary | ICD-10-CM | POA: Diagnosis not present

## 2013-01-25 DIAGNOSIS — K219 Gastro-esophageal reflux disease without esophagitis: Secondary | ICD-10-CM | POA: Diagnosis not present

## 2013-01-25 DIAGNOSIS — E119 Type 2 diabetes mellitus without complications: Secondary | ICD-10-CM | POA: Diagnosis not present

## 2013-01-25 DIAGNOSIS — F329 Major depressive disorder, single episode, unspecified: Secondary | ICD-10-CM | POA: Insufficient documentation

## 2013-01-25 DIAGNOSIS — F411 Generalized anxiety disorder: Secondary | ICD-10-CM | POA: Diagnosis not present

## 2013-01-25 DIAGNOSIS — G43909 Migraine, unspecified, not intractable, without status migrainosus: Secondary | ICD-10-CM | POA: Insufficient documentation

## 2013-01-25 DIAGNOSIS — J45909 Unspecified asthma, uncomplicated: Secondary | ICD-10-CM | POA: Diagnosis not present

## 2013-01-25 DIAGNOSIS — E039 Hypothyroidism, unspecified: Secondary | ICD-10-CM | POA: Insufficient documentation

## 2013-01-25 MED ORDER — SPIRONOLACTONE 25 MG PO TABS: 25.0000 mg | ORAL_TABLET | Freq: Every day | ORAL | Status: DC

## 2013-01-25 MED ORDER — LISINOPRIL 2.5 MG PO TABS: 2.5000 mg | ORAL_TABLET | Freq: Every day | ORAL | Status: DC

## 2013-01-25 NOTE — Patient Instructions (Addendum)
Follow up in 1 month  Take Spironolactone 25 mg daily  Take lisinopril 2.5 mg daily at bed time  Do the following things EVERYDAY: 1) Weigh yourself in the morning before breakfast. Write it down and keep it in a log. 2) Take your medicines as prescribed 3) Eat low salt foods-Limit salt (sodium) to 2000 mg per day.  4) Stay as active as you can everyday 5) Limit all fluids for the day to less than 2 liters

## 2013-01-25 NOTE — Progress Notes (Signed)
Patient ID: Rhonda [name], female   DOB: 08-Dec-1950, 62 y.o.   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 She has had sleep study one year ago- no evidence of sleep apnea.   10/14/12 ECHO- EF 35% RV ok   10/18/12 Creatinine 1.3 Potassium 3.5 11/16/12 Creatinine 1.69 Potassium 2.7  11/24/12 Creatinine 1.23 Potassium 4.1 12/15/12 Creatinine 2.03 Potassium 3.3  12/22/12 Creatinine 1.2 Potassium 3.8   She returns for follow up. Last visit torsemide cut back to 40/20 mg  BID. Denies SOB/PND/Orthopnea/dizziness. Weight at home 254-258 pounds. She has started PT in the pool one day a week.  Complaint with medications.     ROS: All systems negative except as listed in HPI, PMH and Problem List.  Past Medical History  Diagnosis Date  . Unspecified vitamin D deficiency 03/26/2011    does not take meds  . Asthma   . Diabetes mellitus   . Hypertension   . Heart murmur   . Cardiomyopathy     [name]  . Shortness of breath     WITH EXERTION  . Hypothyroidism   . Anemia   . GERD (gastroesophageal reflux disease)     NEXIUM ONLY IF NEEDED  . Breast cancer, stage 1 03/26/2011    left-FINISHED CHEMO  AND RADIATION  . Arthritis     RIGHT KNEE ARTHRITIS AND PAIN-PT TOLD BONE ON BONE  . Depression   . Back pain     DISK PROBLEM  . Complication of anesthesia     PT STATES HER B/P LOW AFTER ONE OF HER SURGERIES--SHE ATTRIBUTES TO LYING FLAT  . CHF (congestive heart failure)     CHRONIC SYSTOLIC HEART  FAILURE[facility] 07/07/11   . Left bundle branch block     CHRONIC [facility] NOTES 07/07/11.  . Gout   . SVD (spontaneous vaginal delivery)     x 2  . Hyperlipidemia   . Anxiety   . Headache(784.0)      hx - migraines, last one 3 mos ago, uses otc med prn  . Dry eyes, bilateral   . Neuromuscular disorder     feet -neuropathy    Current Outpatient Prescriptions  Medication Sig Dispense Refill  . albuterol (PROVENTIL HFA;VENTOLIN HFA) 108 (90 BASE) MCG/ACT inhaler Inhale 2 puffs into the lungs every 6 (six) hours as needed. Wheezing       . albuterol (PROVENTIL) (2.5 MG/3ML) 0.083% nebulizer solution Take 2.5 mg by nebulization every 6 (six) hours as needed. Wheezing       . ARIPiprazole (ABILIFY) 2 MG tablet Take 2 mg by mouth every morning.       Marland Kitchen buPROPion (WELLBUTRIN XL) 150 MG 24 hr tablet Take 300 mg by mouth every morning.       . carvedilol (COREG) 3.125 MG tablet Take 1 tablet in the am and 2 tablets in the pm  90 tablet  3  . clonazePAM (KLONOPIN) 0.5 MG tablet  Take 0.5 mg by mouth 2 (two) times daily as needed. 1 in morning, 1 at bedtime      . diphenhydrAMINE (BENADRYL) 25 MG tablet Take 25 mg by mouth every 6 (six) hours as needed for itching.      . docusate sodium 100 MG CAPS Take 100 mg by mouth 2 (two) times daily.  10 capsule    . febuxostat (ULORIC) 40 MG tablet Take 40 mg by mouth at bedtime.       . ferrous sulfate 325 (65 FE) MG tablet Take 1 tablet (325 mg total) by mouth 3 (three) times daily after meals.      . Fluticasone-Salmeterol (ADVAIR) 250-50 MCG/DOSE AEPB Inhale 1 puff into the lungs every 12 (twelve) hours.       . gabapentin (NEURONTIN) 300 MG capsule Take 300 mg by mouth 2 (two) times daily. May take 3 times daily as needed      . levothyroxine (SYNTHROID, LEVOTHROID) 125 MCG tablet Take 125 mcg by mouth every morning. On an empty stomach      . methocarbamol (ROBAXIN) 500 MG tablet Take 1 tablet (500 mg total)  by mouth every 6 (six) hours as needed (muscle spasms).  50 tablet  0  . PARoxetine (PAXIL-CR) 25 MG 24 hr tablet Take 25 mg by mouth 2 (two) times daily.       . polyethylene glycol (MIRALAX / GLYCOLAX) packet Take 17 g by mouth 2 (two) times daily.  14 each    . potassium chloride SA (K-DUR,KLOR-CON) 20 MEQ tablet Take 1 tablet (20 mEq total) by mouth 2 (two) times daily.  60 tablet  3  . simvastatin (ZOCOR) 10 MG tablet Take 10 mg by mouth every evening.       Marland Kitchen spironolactone (ALDACTONE) 25 MG tablet Take 0.5 tablets (12.5 mg total) by mouth daily.  30 tablet  3  . torsemide (DEMADEX) 20 MG tablet Take 40 mg by mouth 2 (two) times daily.      Marland Kitchen zolpidem (AMBIEN) 10 MG tablet Take 10 mg by mouth at bedtime as needed. For sleep       No current facility-administered medications for this encounter.     PHYSICAL EXAM: Filed Vitals:   01/25/13 0933  BP: 124/76  Pulse: 93  Weight: 257 lb 1.9 oz (116.629 kg)  SpO2: 99%  Last weight: 270>252>257   General:  No resp difficulty; Husband present HEENT: normal Neck: supple. JVP difficult to asses due to body habitus - mildly elelvated; Carotids 2+ bilaterally; no bruits. No lymphadenopathy or thryomegaly appreciated. Cor: PMI nonpalpable. Regular rate & rhythm. No rubs, gallops or murmurs. Lungs: clear Abdomen: morbidly obese, soft, nontender, nondistended. No hepatosplenomegaly. No bruits or masses. Good bowel sounds. Extremities: no cyanosis, clubbing, rash, trace edema Neuro: alert & orientedx3, cranial nerves grossly intact. Moves all 4 extremities w/o difficulty. Affect pleasant.  ASSESSMENT & PLAN:  1) Chronic systolic HF, 10/14/12 EF 35% Volume status mildly elevated. Continue Torsemide 40 mg in am /20 mg in pm. Increase spironolactone 25 mg daily. Baseline for weight is 257-260 pounds -Continue carvedilol 3.125 in am /6.25 mg in pm -Start lisinopril 2.5 mg at bed time.  - Check BMET  In 1 week Reinforced low salt food choices,  limiting fluid intake to < 2 liters per day, daily weight  - Will follow up in 1 month.   2) Obesity - Discussed with patient the need to start exercising and trying to lose weight. Told her  that she could look into Silver Sneakers at [employer].   Niels Cranshaw NP-C 9:50 AM

## 2013-01-26 DIAGNOSIS — M25569 Pain in unspecified knee: Secondary | ICD-10-CM | POA: Diagnosis not present

## 2013-01-27 DIAGNOSIS — M25569 Pain in unspecified knee: Secondary | ICD-10-CM | POA: Diagnosis not present

## 2013-01-31 DIAGNOSIS — M25569 Pain in unspecified knee: Secondary | ICD-10-CM | POA: Diagnosis not present

## 2013-02-01 ENCOUNTER — Ambulatory Visit (INDEPENDENT_AMBULATORY_CARE_PROVIDER_SITE_OTHER): Payer: Medicare Other | Admitting: *Deleted

## 2013-02-01 DIAGNOSIS — M25569 Pain in unspecified knee: Secondary | ICD-10-CM | POA: Diagnosis not present

## 2013-02-01 DIAGNOSIS — I5022 Chronic systolic (congestive) heart failure: Secondary | ICD-10-CM | POA: Diagnosis not present

## 2013-02-01 LAB — BASIC METABOLIC PANEL
BUN: 29 mg/dL — ABNORMAL HIGH (ref 6–23)
CO2: 29 mEq/L (ref 19–32)
Calcium: 8.7 mg/dL (ref 8.4–10.5)
Chloride: 98 mEq/L (ref 96–112)
Creatinine, Ser: 1.5 mg/dL — ABNORMAL HIGH (ref 0.4–1.2)
GFR: 45.17 mL/min — ABNORMAL LOW (ref >60.00)
Glucose, Bld: 108 mg/dL — ABNORMAL HIGH (ref 70–99)
Potassium: 3.4 mEq/L — ABNORMAL LOW (ref 3.5–5.1)
Sodium: 137 mEq/L (ref 135–145)

## 2013-02-03 DIAGNOSIS — L259 Unspecified contact dermatitis, unspecified cause: Secondary | ICD-10-CM | POA: Diagnosis not present

## 2013-02-07 DIAGNOSIS — M25569 Pain in unspecified knee: Secondary | ICD-10-CM | POA: Diagnosis not present

## 2013-02-08 DIAGNOSIS — M25569 Pain in unspecified knee: Secondary | ICD-10-CM | POA: Diagnosis not present

## 2013-02-10 DIAGNOSIS — M25569 Pain in unspecified knee: Secondary | ICD-10-CM | POA: Diagnosis not present

## 2013-02-16 DIAGNOSIS — M25569 Pain in unspecified knee: Secondary | ICD-10-CM | POA: Diagnosis not present

## 2013-02-17 DIAGNOSIS — M25569 Pain in unspecified knee: Secondary | ICD-10-CM | POA: Diagnosis not present

## 2013-02-21 DIAGNOSIS — M25569 Pain in unspecified knee: Secondary | ICD-10-CM | POA: Diagnosis not present

## 2013-02-22 DIAGNOSIS — M25569 Pain in unspecified knee: Secondary | ICD-10-CM | POA: Diagnosis not present

## 2013-02-23 ENCOUNTER — Encounter (HOSPITAL_COMMUNITY): Payer: Self-pay

## 2013-02-23 ENCOUNTER — Ambulatory Visit (HOSPITAL_COMMUNITY)
Admission: RE | Admit: 2013-02-23 | Discharge: 2013-02-23 | Disposition: A | Discharge status: Home / Self Care | Payer: Medicare Other | Source: Ambulatory Visit | Attending: Internal Medicine | Admitting: Internal Medicine

## 2013-02-23 VITALS — BP 96/62 | HR 93 | Ht 63.0 in | Wt 252.4 lb

## 2013-02-23 DIAGNOSIS — I5022 Chronic systolic (congestive) heart failure: Secondary | ICD-10-CM | POA: Diagnosis not present

## 2013-02-23 LAB — BASIC METABOLIC PANEL
BUN: 33 mg/dL — ABNORMAL HIGH (ref 6–23)
CO2: 29 mEq/L (ref 19–32)
Calcium: 9.6 mg/dL (ref 8.4–10.5)
Chloride: 99 mEq/L (ref 96–112)
Creatinine, Ser: 1.68 mg/dL — ABNORMAL HIGH (ref 0.50–1.10)
GFR calc Af Amer: 37 mL/min — ABNORMAL LOW (ref >90)
GFR calc non Af Amer: 32 mL/min — ABNORMAL LOW (ref >90)
Glucose, Bld: 126 mg/dL — ABNORMAL HIGH (ref 70–99)
Potassium: 4.4 mEq/L (ref 3.5–5.1)
Sodium: 139 mEq/L (ref 135–145)

## 2013-02-23 NOTE — Progress Notes (Signed)
Patient ID: Rhonda [name], female   DOB: August 08, 1950, 62 y.o.   MRN: [id number]  Primary Cardiologist: Dr Mayford Knife  General Surgeon: Dr Dwain Sarna  Orthopedic: Dr Charlann Boxer  PCP: Dr Hyman Hopes Referring MD: Dr Mayford Knife  Baseline proBN    HPI: Rhonda [name] is a 62 year old with a PMH of morbid obesity, cleft palate s/p repair, anxiety/depression, breast cancer (triple negative invasive ductal carcinoma) S/P chemo /radiation with 5 cycles of taxotere and carboplatinum 11/2010, chronic systolic heart failure thought to be due to viral CM dating back 1999 with normal cath 2010, HTN and chronic respiratory failure on 3 liters O2 at night. She has had sleep study one year ago- no evidence of sleep apnea.   10/14/12 ECHO- EF 35% RV ok   10/18/12 Creatinine 1.3 Potassium 3.5 11/16/12 Creatinine 1.69 Potassium 2.7  11/24/12 Creatinine 1.23 Potassium 4.1 12/15/12 Creatinine 2.03 Potassium 3.3  12/22/12 Creatinine 1.2 Potassium 3.8   She returns for follow up. Last visit weight was a bit elevated. Spiro increased and lisinopril added.  torsemide cut back to 40/20 mg  BID. Denies SOB/PND/Orthopnea/dizziness. Brings in weight charts. Weighing every day. Weight at home 251-256 pounds (down 2 pounds). Still working with PT. 2x/week doing land exercises and pool . No LE edema. Complaint with medications. Sleeping a lot.     ROS: All systems negative except as listed in HPI, PMH and Problem List.  Past Medical History  Diagnosis Date  . Unspecified vitamin D deficiency 03/26/2011    does not take meds  . Asthma   . Diabetes mellitus   . Hypertension   . Heart murmur   . Cardiomyopathy     [name]  . Shortness of breath     WITH EXERTION  . Hypothyroidism   . Anemia   . GERD (gastroesophageal reflux disease)     NEXIUM ONLY IF NEEDED  . Breast cancer, stage 1 03/26/2011    left-FINISHED CHEMO  AND RADIATION  . Arthritis     RIGHT KNEE ARTHRITIS AND PAIN-PT TOLD BONE ON BONE  . Depression    . Back pain     DISK PROBLEM  . Complication of anesthesia     PT STATES HER B/P LOW AFTER ONE OF HER SURGERIES--SHE ATTRIBUTES TO LYING FLAT  . CHF (congestive heart failure)     CHRONIC SYSTOLIC HEART FAILURE-PER [facility] NOTE 07/07/11   . Left bundle branch block     CHRONIC PER [facility]ICE NOTES 07/07/11.  . Gout   . SVD (spontaneous vaginal delivery)     x 2  . Hyperlipidemia   . Anxiety   . Headache(784.0)      hx - migraines, last one 3 mos ago, uses otc med prn  . Dry eyes, bilateral   . Neuromuscular disorder     feet -neuropathy    Current Outpatient Prescriptions  Medication Sig Dispense Refill  . albuterol (PROVENTIL HFA;VENTOLIN HFA) 108 (90 BASE) MCG/ACT inhaler Inhale 2 puffs into the lungs every 6 (six) hours as needed. Wheezing       . albuterol (PROVENTIL) (2.5 MG/3ML) 0.083% nebulizer solution Take 2.5 mg by nebulization every 6 (six) hours as needed. Wheezing       . ARIPiprazole (ABILIFY) 2 MG tablet Take 2 mg by mouth every morning.       Marland Kitchen buPROPion (WELLBUTRIN XL) 150 MG 24 hr tablet Take 300 mg by mouth every morning.       Marland Kitchen  carvedilol (COREG) 3.125 MG tablet Take 1 tablet in the am and 2 tablets in the pm  90 tablet  3  . clonazePAM (KLONOPIN) 0.5 MG tablet Take 0.5 mg by mouth 2 (two) times daily as needed. 1 in morning, 1 at bedtime      . diphenhydrAMINE (BENADRYL) 25 MG tablet Take 25 mg by mouth every 6 (six) hours as needed for itching.      . docusate sodium 100 MG CAPS Take 100 mg by mouth 2 (two) times daily.  10 capsule    . febuxostat (ULORIC) 40 MG tablet Take 40 mg by mouth at bedtime.       . ferrous sulfate 325 (65 FE) MG tablet Take 1 tablet (325 mg total) by mouth 3 (three) times daily after meals.      . Fluticasone-Salmeterol (ADVAIR) 250-50 MCG/DOSE AEPB Inhale 1 puff into the lungs every 12 (twelve) hours.       . gabapentin (NEURONTIN) 300 MG capsule Take 300 mg by mouth 2 (two) times daily. May take 3  times daily as needed      . levothyroxine (SYNTHROID, LEVOTHROID) 125 MCG tablet Take 125 mcg by mouth every morning. On an empty stomach      . lisinopril (ZESTRIL) 2.5 MG tablet Take 1 tablet (2.5 mg total) by mouth at bedtime.  30 tablet  3  . methocarbamol (ROBAXIN) 500 MG tablet Take 1 tablet (500 mg total) by mouth every 6 (six) hours as needed (muscle spasms).  50 tablet  0  . PARoxetine (PAXIL-CR) 25 MG 24 hr tablet Take 25 mg by mouth 2 (two) times daily.       . polyethylene glycol (MIRALAX / GLYCOLAX) packet Take 17 g by mouth 2 (two) times daily.  14 each    . potassium chloride SA (K-DUR,KLOR-CON) 20 MEQ tablet Take 20 mEq by mouth. Two in am and two in pm      . simvastatin (ZOCOR) 10 MG tablet Take 10 mg by mouth every evening.       Marland Kitchen spironolactone (ALDACTONE) 25 MG tablet Take 1 tablet (25 mg total) by mouth daily.  30 tablet  3  . torsemide (DEMADEX) 20 MG tablet Take 40 mg by mouth 2 (two) times daily.      Marland Kitchen zolpidem (AMBIEN) 10 MG tablet Take 10 mg by mouth at bedtime as needed. For sleep       No current facility-administered medications for this encounter.     PHYSICAL EXAM: Filed Vitals:   02/23/13 1017  BP: 96/62  Pulse: 93  Height: 5\' 3"  (1.6 m)  Weight: 252 lb 6.4 oz (114.488 kg)  SpO2: 99%  Last weight: 270>252>257 >252  General:  No resp difficulty; Husband present HEENT: normal except for cleft palate repair Neck: supple. JVP difficult to asses due to body habitus - appear ok; Carotids 2+ bilaterally; no bruits. No lymphadenopathy or thryomegaly appreciated. Cor: PMI nonpalpable. Regular rate & rhythm. No rubs, gallops or murmurs. Lungs: clear Abdomen: morbidly obese, soft, nontender, nondistended. No hepatosplenomegaly. No bruits or masses. Good bowel sounds. Extremities: no cyanosis, clubbing, rash, no edema Neuro: alert & orientedx3, cranial nerves grossly intact. Moves all 4 extremities w/o difficulty. Affect flat  ASSESSMENT & PLAN:  1)  Chronic systolic HF, 10/14/12 EF 35% - volume status looks good. BP if soft so will not titrate HF meds further at this point.  -return in 1 month with repeat echo  - Check BMET  today  2) Obesity - continue diet and exercise  Arvilla Meres MD 10:42 AM

## 2013-02-23 NOTE — Patient Instructions (Addendum)
Your physician recommends that you schedule a follow-up appointment in: 1 month with an echocardiogram

## 2013-02-23 NOTE — Addendum Note (Signed)
Encounter addended by: Noralee Space, RN on: 02/23/2013 10:55 AM<BR>     Documentation filed: Patient Instructions Section, Orders

## 2013-02-24 DIAGNOSIS — M25569 Pain in unspecified knee: Secondary | ICD-10-CM | POA: Diagnosis not present

## 2013-02-28 DIAGNOSIS — M25569 Pain in unspecified knee: Secondary | ICD-10-CM | POA: Diagnosis not present

## 2013-03-02 ENCOUNTER — Other Ambulatory Visit (HOSPITAL_COMMUNITY): Payer: Self-pay | Admitting: *Deleted

## 2013-03-02 DIAGNOSIS — E039 Hypothyroidism, unspecified: Secondary | ICD-10-CM | POA: Diagnosis not present

## 2013-03-02 DIAGNOSIS — E559 Vitamin D deficiency, unspecified: Secondary | ICD-10-CM | POA: Diagnosis not present

## 2013-03-02 DIAGNOSIS — Z23 Encounter for immunization: Secondary | ICD-10-CM | POA: Diagnosis not present

## 2013-03-02 DIAGNOSIS — E1129 Type 2 diabetes mellitus with other diabetic kidney complication: Secondary | ICD-10-CM | POA: Diagnosis not present

## 2013-03-02 DIAGNOSIS — I10 Essential (primary) hypertension: Secondary | ICD-10-CM | POA: Diagnosis not present

## 2013-03-02 DIAGNOSIS — E782 Mixed hyperlipidemia: Secondary | ICD-10-CM | POA: Diagnosis not present

## 2013-03-02 DIAGNOSIS — D649 Anemia, unspecified: Secondary | ICD-10-CM | POA: Diagnosis not present

## 2013-03-02 MED ORDER — POTASSIUM CHLORIDE CRYS ER 20 MEQ PO TBCR: EXTENDED_RELEASE_TABLET | ORAL | Status: DC

## 2013-03-03 DIAGNOSIS — M25569 Pain in unspecified knee: Secondary | ICD-10-CM | POA: Diagnosis not present

## 2013-03-08 DIAGNOSIS — M25569 Pain in unspecified knee: Secondary | ICD-10-CM | POA: Diagnosis not present

## 2013-03-10 DIAGNOSIS — M25569 Pain in unspecified knee: Secondary | ICD-10-CM | POA: Diagnosis not present

## 2013-03-15 DIAGNOSIS — M25569 Pain in unspecified knee: Secondary | ICD-10-CM | POA: Diagnosis not present

## 2013-03-22 DIAGNOSIS — M25569 Pain in unspecified knee: Secondary | ICD-10-CM | POA: Diagnosis not present

## 2013-03-23 ENCOUNTER — Other Ambulatory Visit (HOSPITAL_COMMUNITY): Payer: Self-pay | Admitting: Cardiology

## 2013-03-23 DIAGNOSIS — I5022 Chronic systolic (congestive) heart failure: Secondary | ICD-10-CM

## 2013-03-23 MED ORDER — SPIRONOLACTONE 25 MG PO TABS: 25.0000 mg | ORAL_TABLET | Freq: Every day | ORAL | Status: DC

## 2013-03-27 ENCOUNTER — Other Ambulatory Visit (HOSPITAL_COMMUNITY): Payer: Self-pay | Admitting: *Deleted

## 2013-03-27 MED ORDER — TORSEMIDE 20 MG PO TABS: 40.0000 mg | ORAL_TABLET | Freq: Two times a day (BID) | ORAL | Status: DC

## 2013-03-30 ENCOUNTER — Encounter (HOSPITAL_COMMUNITY): Payer: Medicare Other

## 2013-03-30 ENCOUNTER — Ambulatory Visit (HOSPITAL_COMMUNITY): Payer: Medicare Other

## 2013-04-03 DIAGNOSIS — N760 Acute vaginitis: Secondary | ICD-10-CM | POA: Diagnosis not present

## 2013-04-06 DIAGNOSIS — F0630 Mood disorder due to known physiological condition, unspecified: Secondary | ICD-10-CM | POA: Diagnosis not present

## 2013-04-12 ENCOUNTER — Ambulatory Visit (HOSPITAL_COMMUNITY)
Admission: RE | Admit: 2013-04-12 | Discharge: 2013-04-12 | Disposition: A | Discharge status: Home / Self Care | Payer: Medicare Other | Source: Ambulatory Visit | Attending: Family Medicine | Admitting: Family Medicine

## 2013-04-12 ENCOUNTER — Telehealth (HOSPITAL_COMMUNITY): Payer: Self-pay | Admitting: Adult Health

## 2013-04-12 ENCOUNTER — Ambulatory Visit (HOSPITAL_BASED_OUTPATIENT_CLINIC_OR_DEPARTMENT_OTHER)
Admission: RE | Admit: 2013-04-12 | Discharge: 2013-04-12 | Disposition: A | Discharge status: Home / Self Care | Payer: Medicare Other | Source: Ambulatory Visit | Attending: Internal Medicine | Admitting: Internal Medicine

## 2013-04-12 VITALS — BP 94/58 | HR 86 | Wt 248.2 lb

## 2013-04-12 DIAGNOSIS — K219 Gastro-esophageal reflux disease without esophagitis: Secondary | ICD-10-CM | POA: Diagnosis not present

## 2013-04-12 DIAGNOSIS — F411 Generalized anxiety disorder: Secondary | ICD-10-CM | POA: Insufficient documentation

## 2013-04-12 DIAGNOSIS — J9610 Chronic respiratory failure, unspecified whether with hypoxia or hypercapnia: Secondary | ICD-10-CM | POA: Diagnosis not present

## 2013-04-12 DIAGNOSIS — D649 Anemia, unspecified: Secondary | ICD-10-CM | POA: Diagnosis not present

## 2013-04-12 DIAGNOSIS — E559 Vitamin D deficiency, unspecified: Secondary | ICD-10-CM | POA: Insufficient documentation

## 2013-04-12 DIAGNOSIS — I428 Other cardiomyopathies: Secondary | ICD-10-CM | POA: Insufficient documentation

## 2013-04-12 DIAGNOSIS — M109 Gout, unspecified: Secondary | ICD-10-CM | POA: Diagnosis not present

## 2013-04-12 DIAGNOSIS — Z853 Personal history of malignant neoplasm of breast: Secondary | ICD-10-CM | POA: Diagnosis not present

## 2013-04-12 DIAGNOSIS — F3289 Other specified depressive episodes: Secondary | ICD-10-CM | POA: Insufficient documentation

## 2013-04-12 DIAGNOSIS — I10 Essential (primary) hypertension: Secondary | ICD-10-CM | POA: Diagnosis not present

## 2013-04-12 DIAGNOSIS — Z79899 Other long term (current) drug therapy: Secondary | ICD-10-CM | POA: Diagnosis not present

## 2013-04-12 DIAGNOSIS — J45909 Unspecified asthma, uncomplicated: Secondary | ICD-10-CM | POA: Insufficient documentation

## 2013-04-12 DIAGNOSIS — R0609 Other forms of dyspnea: Secondary | ICD-10-CM | POA: Diagnosis not present

## 2013-04-12 DIAGNOSIS — E039 Hypothyroidism, unspecified: Secondary | ICD-10-CM | POA: Diagnosis not present

## 2013-04-12 DIAGNOSIS — I517 Cardiomegaly: Secondary | ICD-10-CM | POA: Diagnosis not present

## 2013-04-12 DIAGNOSIS — I447 Left bundle-branch block, unspecified: Secondary | ICD-10-CM | POA: Insufficient documentation

## 2013-04-12 DIAGNOSIS — H04129 Dry eye syndrome of unspecified lacrimal gland: Secondary | ICD-10-CM | POA: Diagnosis not present

## 2013-04-12 DIAGNOSIS — F329 Major depressive disorder, single episode, unspecified: Secondary | ICD-10-CM | POA: Diagnosis not present

## 2013-04-12 DIAGNOSIS — R0989 Other specified symptoms and signs involving the circulatory and respiratory systems: Secondary | ICD-10-CM | POA: Insufficient documentation

## 2013-04-12 DIAGNOSIS — G5790 Unspecified mononeuropathy of unspecified lower limb: Secondary | ICD-10-CM | POA: Diagnosis not present

## 2013-04-12 DIAGNOSIS — E785 Hyperlipidemia, unspecified: Secondary | ICD-10-CM | POA: Insufficient documentation

## 2013-04-12 DIAGNOSIS — I5022 Chronic systolic (congestive) heart failure: Secondary | ICD-10-CM

## 2013-04-12 DIAGNOSIS — I509 Heart failure, unspecified: Secondary | ICD-10-CM | POA: Diagnosis not present

## 2013-04-12 DIAGNOSIS — E119 Type 2 diabetes mellitus without complications: Secondary | ICD-10-CM | POA: Diagnosis not present

## 2013-04-12 LAB — BASIC METABOLIC PANEL
BUN: 52 mg/dL — ABNORMAL HIGH (ref 6–23)
CO2: 28 mEq/L (ref 19–32)
Calcium: 9.6 mg/dL (ref 8.4–10.5)
Chloride: 93 mEq/L — ABNORMAL LOW (ref 96–112)
Creatinine, Ser: 1.93 mg/dL — ABNORMAL HIGH (ref 0.50–1.10)
GFR calc Af Amer: 31 mL/min — ABNORMAL LOW (ref >90)
GFR calc non Af Amer: 27 mL/min — ABNORMAL LOW (ref >90)
Glucose, Bld: 124 mg/dL — ABNORMAL HIGH (ref 70–99)
Potassium: 4 mEq/L (ref 3.5–5.1)
Sodium: 136 mEq/L (ref 135–145)

## 2013-04-12 LAB — PRO B NATRIURETIC PEPTIDE: Pro B Natriuretic peptide (BNP): 555.3 pg/mL — ABNORMAL HIGH (ref 0–125)

## 2013-04-12 MED ORDER — DIGOXIN 125 MCG PO TABS: 0.1250 mg | ORAL_TABLET | Freq: Every day | ORAL | Status: DC

## 2013-04-12 NOTE — Progress Notes (Signed)
Patient ID: Rhonda [name], female   DOB: [birth year], 62 y.o.   MRN: [id number]  Primary Cardiologist: Dr Mayford Knife  General Surgeon: Dr Dwain Sarna  Orthopedic: Dr Charlann Boxer  PCP: Dr Hyman Hopes Referring MD: Dr Mayford Knife  Baseline proBN    HPI: Rhonda [name] is a 62 year old with a PMH of morbid obesity, cleft palate s/p repair, anxiety/depression, breast cancer (triple negative invasive ductal carcinoma) S/P chemo/radiation with 5 cycles of taxotere and carboplatinum 11/2010, chronic systolic heart failure thought to be due to viral CM dating back 1999 with normal cath 2010, HTN and chronic respiratory failure on 3 liters O2 at night. She has had sleep study one year ago- no evidence of sleep apnea.   10/14/12 ECHO EF 35% RV ok  04/12/13 ECHO EF 20-25%, LV moderately dilated  10/18/12 Creatinine 1.3 Potassium 3.5 11/16/12 Creatinine 1.69 Potassium 2.7  11/24/12 Creatinine 1.23 Potassium 4.1 12/15/12 Creatinine 2.03 Potassium 3.3  12/22/12 Creatinine 1.2 Potassium 3.8 02/23/13 Creatinine 1.68 K 4.4   She returns for follow up. Complains of back pain.  Dyspnea when walking briskly. Denies PND/Orthopnea. Very fatigued. She has to move slow.  Weight at home 246-247 pounds.  She take extra 20 mg torsemide 1-2 times per week. No LE edema. No lightheadedness.  Compliant with medications. Plans to start swimming again.   ECG: NSR, LBBB with QRS 176 msec  ROS: All systems negative except as listed in HPI, PMH and Problem List.  Past Medical History  Diagnosis Date  . Unspecified vitamin D deficiency 03/26/2011    does not take meds  . Asthma   . Diabetes mellitus   . Hypertension   . Heart murmur   . Cardiomyopathy     [name]  . Shortness of breath     WITH EXERTION  . Hypothyroidism   . Anemia   . GERD (gastroesophageal reflux disease)     NEXIUM ONLY IF NEEDED  . Breast cancer, stage 1 03/26/2011    left-FINISHED CHEMO  AND RADIATION  . Arthritis     RIGHT KNEE ARTHRITIS AND  PAIN-PT TOLD BONE ON BONE  . Depression   . Back pain     DISK PROBLEM  . Complication of anesthesia     PT STATES HER B/P LOW AFTER ONE OF HER SURGERIES--SHE ATTRIBUTES TO LYING FLAT  . CHF (congestive heart failure)     CHRONIC SYSTOLIC HEART FAILURE-[facility] NOTE 07/07/11   . Left bundle branch block     CHRONIC PER [facility]ICE NOTES 07/07/11.  . Gout   . SVD (spontaneous vaginal delivery)     x 2  . Hyperlipidemia   . Anxiety   . Headache(784.0)      hx - migraines, last one 3 mos ago, uses otc med prn  . Dry eyes, bilateral   . Neuromuscular disorder     feet -neuropathy    Current Outpatient Prescriptions  Medication Sig Dispense Refill  . albuterol (PROVENTIL HFA;VENTOLIN HFA) 108 (90 BASE) MCG/ACT inhaler Inhale 2 puffs into the lungs every 6 (six) hours as needed. Wheezing       . albuterol (PROVENTIL) (2.5 MG/3ML) 0.083% nebulizer solution Take 2.5 mg by nebulization every 6 (six) hours as needed. Wheezing       . ARIPiprazole (ABILIFY) 2 MG tablet Take 2 mg by mouth every morning.       Marland Kitchen buPROPion (WELLBUTRIN XL) 150 MG 24 hr tablet Take 300 mg by  mouth every morning.       . carvedilol (COREG) 3.125 MG tablet Take 1 tablet in the am and 2 tablets in the pm  90 tablet  3  . clonazePAM (KLONOPIN) 0.5 MG tablet Take 0.5 mg by mouth 2 (two) times daily as needed. 1 in morning, 1 at bedtime      . docusate sodium 100 MG CAPS Take 100 mg by mouth 2 (two) times daily.  10 capsule    . febuxostat (ULORIC) 40 MG tablet Take 40 mg by mouth at bedtime.       . ferrous sulfate 325 (65 FE) MG tablet Take 1 tablet (325 mg total) by mouth 3 (three) times daily after meals.      . Fluticasone-Salmeterol (ADVAIR) 250-50 MCG/DOSE AEPB Inhale 1 puff into the lungs every 12 (twelve) hours.       . gabapentin (NEURONTIN) 300 MG capsule Take 300 mg by mouth 2 (two) times daily. May take 3 times daily as needed      . ibuprofen (ADVIL,MOTRIN) 800 MG tablet Take  800 mg by mouth 3 (three) times daily as needed.      Marland Kitchen levothyroxine (SYNTHROID, LEVOTHROID) 125 MCG tablet Take 125 mcg by mouth every morning. On an empty stomach      . lisinopril (ZESTRIL) 2.5 MG tablet Take 1 tablet (2.5 mg total) by mouth at bedtime.  30 tablet  3  . methocarbamol (ROBAXIN) 500 MG tablet Take 1 tablet (500 mg total) by mouth every 6 (six) hours as needed (muscle spasms).  50 tablet  0  . polyethylene glycol (MIRALAX / GLYCOLAX) packet Take 17 g by mouth 2 (two) times daily.  14 each    . potassium chloride SA (K-DUR,KLOR-CON) 20 MEQ tablet Take 2 tabs in AM and 1 tab in PM  90 tablet  3  . simvastatin (ZOCOR) 10 MG tablet Take 10 mg by mouth every evening.       Marland Kitchen spironolactone (ALDACTONE) 25 MG tablet Take 1 tablet (25 mg total) by mouth daily.  30 tablet  3  . torsemide (DEMADEX) 20 MG tablet Take 2 tabs in AM and 1 tab in PM      . zolpidem (AMBIEN) 10 MG tablet Take 10 mg by mouth at bedtime as needed. For sleep       No current facility-administered medications for this encounter.     PHYSICAL EXAM: Filed Vitals:   04/12/13 1208  BP: 94/58  Pulse: 86  Weight: 248 lb 4 oz (112.605 kg)  SpO2: 100%  Last weight: 270>252>257 >252> 248 pounds  General:  No resp difficulty; Husband present HEENT: normal except for cleft palate repair Neck: supple. JVP difficult to asses due to body habitus - appear ok; Carotids 2+ bilaterally; no bruits. No lymphadenopathy or thryomegaly appreciated. Cor: PMI nonpalpable. Regular rate & rhythm. Paradoxically split S2.  No rubs, gallops or murmurs. Lungs: clear Abdomen: morbidly obese, soft, nontender, nondistended. No hepatosplenomegaly. No bruits or masses. Good bowel sounds. Extremities: no cyanosis, clubbing, rash, no edema Neuro: alert & orientedx3, cranial nerves grossly intact. Moves all 4 extremities w/o difficulty. Affect flat  ASSESSMENT & PLAN:  1) Chronic systolic HF: Nonischemic cardiomyopathy.  ?Viral  myocarditis, cardiomyopathy dates from 1999. 11/14 echo EF 20-25% NYHA IIIB. Dr Shirlee Latch reviewed and dicussed ECHO.  Has wide LBBB.  Volume status looks good. Continue torsemide 40 mg/20 mg  daily and 25 mg spironolactone daily.  Concern for low output with profound fatigue.  Minimal room for medication titration with soft BP.  - Continue carvedilol 3.125 mg bid.  Continue lisinopril 2.5 mg daily. Unable to up titrate meds due soft BP.  - Add 0.125 mg digoxin daily due to concern for low output.  - Refer for BiV-ICD - Will plan CPX after BiV-ICD.  - Reinforced daily weights, low salt foods, and limiting fluid intake to < 2 liters per day.  - Check bmet and pro bnp today  2) Obesity - continue diet and exercise   Follow up in 6-8 weeks   CLEGG,AMY NP-C 12:17 PM  Patient seen with NP, agree with the above note.    Fatigue is the most prominent symptom.  She does not appear volume overload.  Suspect this may be more of a low output problem at this point.   - Start digoxin. - No room to titrate other meds.  - Refer for BiV-ICD.   - CPX after BiV-ICD.  - If no improvement with CRT, she would likely be a reasonable candidate for advanced therapies.   Marca Ancona 04/13/2013

## 2013-04-12 NOTE — Patient Instructions (Addendum)
Take 0.125 mg digoxin daily  You have been referred to Dr Ladona Ridgel on Dec 9th at 9:45 AM  Your physician recommends that you schedule a follow-up appointment in: 1 month  Do the following things EVERYDAY: 1) Weigh yourself in the morning before breakfast. Write it down and keep it in a log. 2) Take your medicines as prescribed 3) Eat low salt foods-Limit salt (sodium) to 2000 mg per day.  4) Stay as active as you can everyday 5) Limit all fluids for the day to less than 2 liters

## 2013-04-12 NOTE — Progress Notes (Signed)
Echo Lab  2D Echocardiogram completed.  Katharina Caper Keni Elison, RDCS 04/12/2013 12:38 PM

## 2013-04-12 NOTE — Telephone Encounter (Signed)
Provided with lab results.   Creatinine trending up from 1.6> 1.9   Instructed to hold torsemide and restart on Saturday. Plan to repeat BMET 04/17/13 at [facility].   Mrs Bacha verbalized understanding.    Olamae Ferrara 2:50 PM

## 2013-04-13 NOTE — Addendum Note (Signed)
Encounter addended by: Ondre Salvetti H Mayara Paulson, CCT on: 04/13/2013 11:44 AM<BR>     Documentation filed: Charges VN

## 2013-04-17 ENCOUNTER — Other Ambulatory Visit: Payer: Medicare Other

## 2013-04-17 ENCOUNTER — Other Ambulatory Visit: Payer: Self-pay | Admitting: *Deleted

## 2013-04-17 ENCOUNTER — Telehealth (HOSPITAL_COMMUNITY): Payer: Self-pay | Admitting: Cardiology

## 2013-04-17 DIAGNOSIS — I5022 Chronic systolic (congestive) heart failure: Secondary | ICD-10-CM

## 2013-04-17 NOTE — Telephone Encounter (Signed)
Order placed for labs.

## 2013-04-21 DIAGNOSIS — N390 Urinary tract infection, site not specified: Secondary | ICD-10-CM | POA: Diagnosis not present

## 2013-04-27 DIAGNOSIS — E039 Hypothyroidism, unspecified: Secondary | ICD-10-CM | POA: Diagnosis not present

## 2013-05-02 ENCOUNTER — Encounter: Payer: Self-pay | Admitting: *Deleted

## 2013-05-02 ENCOUNTER — Ambulatory Visit (INDEPENDENT_AMBULATORY_CARE_PROVIDER_SITE_OTHER): Payer: Medicare Other | Admitting: Internal Medicine

## 2013-05-02 ENCOUNTER — Telehealth: Payer: Self-pay | Admitting: Internal Medicine

## 2013-05-02 ENCOUNTER — Encounter: Payer: Self-pay | Admitting: Internal Medicine

## 2013-05-02 VITALS — BP 100/68 | HR 62 | Ht 63.0 in | Wt 247.4 lb

## 2013-05-02 DIAGNOSIS — I5022 Chronic systolic (congestive) heart failure: Secondary | ICD-10-CM | POA: Diagnosis not present

## 2013-05-02 LAB — CBC WITH DIFFERENTIAL/PLATELET
Basophils Absolute: 0 10*3/uL (ref 0.0–0.1)
Basophils Relative: 0.3 % (ref 0.0–3.0)
Eosinophils Absolute: 0.3 10*3/uL (ref 0.0–0.7)
Eosinophils Relative: 3 % (ref 0.0–5.0)
HCT: 34.4 % — ABNORMAL LOW (ref 36.0–46.0)
Hemoglobin: 11.2 g/dL — ABNORMAL LOW (ref 12.0–15.0)
Lymphocytes Relative: 17.7 % (ref 12.0–46.0)
Lymphs Abs: 1.6 10*3/uL (ref 0.7–4.0)
MCHC: 32.5 g/dL (ref 30.0–36.0)
MCV: 99.2 fl (ref 78.0–100.0)
Monocytes Absolute: 0.6 10*3/uL (ref 0.1–1.0)
Monocytes Relative: 6.6 % (ref 3.0–12.0)
Neutro Abs: 6.6 10*3/uL (ref 1.4–7.7)
Neutrophils Relative %: 72.4 % (ref 43.0–77.0)
Platelets: 421 10*3/uL — ABNORMAL HIGH (ref 150.0–400.0)
RBC: 3.47 Mil/uL — ABNORMAL LOW (ref 3.87–5.11)
RDW: 13.8 % (ref 11.5–14.6)
WBC: 9.1 10*3/uL (ref 4.5–10.5)

## 2013-05-02 LAB — BASIC METABOLIC PANEL
BUN: 42 mg/dL — ABNORMAL HIGH (ref 6–23)
CO2: 27 mEq/L (ref 19–32)
Calcium: 9 mg/dL (ref 8.4–10.5)
Chloride: 92 mEq/L — ABNORMAL LOW (ref 96–112)
Creatinine, Ser: 1.8 mg/dL — ABNORMAL HIGH (ref 0.4–1.2)
GFR: 36.85 mL/min — ABNORMAL LOW (ref >60.00)
Glucose, Bld: 105 mg/dL — ABNORMAL HIGH (ref 70–99)
Potassium: 4.2 mEq/L (ref 3.5–5.1)
Sodium: 130 mEq/L — ABNORMAL LOW (ref 135–145)

## 2013-05-02 MED ORDER — VANCOMYCIN HCL 10 G IV SOLR
1500.0000 mg | INTRAVENOUS | Status: DC
  Filled 2013-05-02: qty 1500

## 2013-05-02 MED ORDER — SODIUM CHLORIDE 0.9 % IR SOLN
80.0000 mg | Status: DC
  Filled 2013-05-02: qty 2

## 2013-05-02 NOTE — Assessment & Plan Note (Signed)
Her heart failure symptoms are class III despite maximal medical therapy and she has underlying left bundle branch block. I discussed the treatment options with the patient and her family. The risk, goals, benefits, and expectations of biventricular ICD implantation have been discussed with the patient, and she wishes to proceed.

## 2013-05-02 NOTE — Telephone Encounter (Signed)
New message    Saw Dr Ladona Ridgel this am- getting a ICD put in tomorrow---pt has questions

## 2013-05-02 NOTE — Patient Instructions (Signed)
Your physician has recommended that you have a defibrillator inserted. An implantable cardioverter defibrillator (ICD) is a small device that is placed in your chest or, in rare cases, your abdomen. This device uses electrical pulses or shocks to help control life-threatening, irregular heartbeats that could lead the heart to suddenly stop beating (sudden cardiac arrest). Leads are attached to the ICD that goes into your heart. This is done in the hospital and usually requires an overnight stay. Please see the instruction sheet given to you today for more information.  See instruction sheet 

## 2013-05-02 NOTE — Progress Notes (Signed)
    HPI Rhonda [name] is referred today by her congestive heart failure clinic for consideration for biventricular ICD implantation. She is a very pleasant 62-year-old woman with a history of nonischemic cardiomyopathy, with severe left ventricular dysfunction. On maximal medical therapy, and echocardiogram was obtained back in may of 2014 which demonstrated an ejection fraction of 35%. Attempts to up titrate her medical therapy was then carried out and a repeat echo was performed several weeks ago which demonstrated an ejection fraction of 20-25%. She has class III heart failure symptoms. She has left bundle branch block with a QRS duration of 165 ms. She has not had syncope. She denies chest pain. She has minimal peripheral edema. Allergies  Allergen Reactions  . Ceftin Anaphylaxis    Face and throat swell   . Shellfish Allergy Other (See Comments)    Gout exacerbation  . Allopurinol Nausea Only and Other (See Comments)    weakness  . Lorazepam Itching  . Sulfa Antibiotics Itching  . Ultram [Tramadol Hcl] Itching     Current Outpatient Prescriptions  Medication Sig Dispense Refill  . albuterol (PROVENTIL HFA;VENTOLIN HFA) 108 (90 BASE) MCG/ACT inhaler Inhale 2 puffs into the lungs every 6 (six) hours as needed. Wheezing       . albuterol (PROVENTIL) (2.5 MG/3ML) 0.083% nebulizer solution Take 2.5 mg by nebulization every 6 (six) hours as needed. Wheezing       . ARIPiprazole (ABILIFY) 2 MG tablet Take 2 mg by mouth every morning.       . buPROPion (WELLBUTRIN XL) 150 MG 24 hr tablet Take 300 mg by mouth every morning.       . carvedilol (COREG) 3.125 MG tablet Take 1 tablet in the am and 2 tablets in the pm  90 tablet  3  . clonazePAM (KLONOPIN) 0.5 MG tablet Take 0.5 mg by mouth 2 (two) times daily as needed. 1 in morning, 1 at bedtime      . digoxin (LANOXIN) 0.125 MG tablet Take 1 tablet (0.125 mg total) by mouth daily.  30 tablet  6  . docusate sodium 100 MG CAPS Take 100 mg by  mouth 2 (two) times daily.  10 capsule    . febuxostat (ULORIC) 40 MG tablet Take 40 mg by mouth at bedtime.       . ferrous sulfate 325 (65 FE) MG tablet Take 1 tablet (325 mg total) by mouth 3 (three) times daily after meals.      . Fluticasone-Salmeterol (ADVAIR) 250-50 MCG/DOSE AEPB Inhale 1 puff into the lungs every 12 (twelve) hours.       . gabapentin (NEURONTIN) 300 MG capsule Take 300 mg by mouth 2 (two) times daily. May take 3 times daily as needed      . ibuprofen (ADVIL,MOTRIN) 800 MG tablet Take 800 mg by mouth 3 (three) times daily as needed.      . levothyroxine (SYNTHROID, LEVOTHROID) 125 MCG tablet Take 125 mcg by mouth every morning. On an empty stomach      . lisinopril (ZESTRIL) 2.5 MG tablet Take 1 tablet (2.5 mg total) by mouth at bedtime.  30 tablet  3  . methocarbamol (ROBAXIN) 500 MG tablet Take 1 tablet (500 mg total) by mouth every 6 (six) hours as needed (muscle spasms).  50 tablet  0  . polyethylene glycol (MIRALAX / GLYCOLAX) packet Take 17 g by mouth 2 (two) times daily.  14 each    . potassium chloride SA (K-DUR,KLOR-CON) 20 MEQ   tablet Take 2 tabs in AM and 1 tab in PM  90 tablet  3  . simvastatin (ZOCOR) 10 MG tablet Take 10 mg by mouth every evening.       . spironolactone (ALDACTONE) 25 MG tablet Take 1 tablet (25 mg total) by mouth daily.  30 tablet  3  . torsemide (DEMADEX) 20 MG tablet Take 2 tabs in AM and 1 tab in PM      . zolpidem (AMBIEN) 10 MG tablet Take 10 mg by mouth at bedtime as needed. For sleep       No current facility-administered medications for this visit.     Past Medical History  Diagnosis Date  . Unspecified vitamin D deficiency 03/26/2011    does not take meds  . Asthma   . Diabetes mellitus   . Hypertension   . Heart murmur   . Cardiomyopathy     PT'S CARDIOLOGIST IS DR. TRACI TURNER  . Shortness of breath     WITH EXERTION  . Hypothyroidism   . Anemia   . GERD (gastroesophageal reflux disease)     NEXIUM ONLY IF NEEDED  .  Breast cancer, stage 1 03/26/2011    left-FINISHED CHEMO  AND RADIATION  . Arthritis     RIGHT KNEE ARTHRITIS AND PAIN-PT TOLD BONE ON BONE  . Depression   . Back pain     DISK PROBLEM  . Complication of anesthesia     PT STATES HER B/P LOW AFTER ONE OF HER SURGERIES--SHE ATTRIBUTES TO LYING FLAT  . CHF (congestive heart failure)     CHRONIC SYSTOLIC HEART FAILURE-[facility] 07/07/11   . Left bundle branch block     CHRONIC PER [facility] NOTES 07/07/11.  . Gout   . SVD (spontaneous vaginal delivery)     x 2  . Hyperlipidemia   . Anxiety   . Headache(784.0)      hx - migraines, last one 3 mos ago, uses otc med prn  . Dry eyes, bilateral   . Neuromuscular disorder     feet -neuropathy    ROS:   All systems reviewed and negative except as noted in the HPI.   Past Surgical History  Procedure Laterality Date  . Portacath placement  2012  . 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 Cardiac catheterization  2010    NORMAL CORONARY ARTERIES  . Port-a-cath removal  09/23/2011    Procedure: REMOVAL PORT-A-CATH;  Surgeon: Matthew Wakefield, MD;  Location: [facility];  Service: General;  Laterality: N/A;  Port Removal  . Breast surgery  07/28/2010    lumpectomy , snbx - left  . Wisdom tooth extraction    . Colonoscopy    . Hysteroscopy w/d&c  01/20/2012    Procedure: DILATATION AND CURETTAGE /HYSTEROSCOPY;  Surgeon: Richard M Holland, MD;  Location: [facility];  Service: Gynecology;  Laterality: N/A;  with trueclear  . Total knee arthroplasty  05/10/2012    Procedure: TOTAL KNEE ARTHROPLASTY;  Surgeon: Matthew D Olin, MD;  Location: [facility];  Service: Orthopedics;  Laterality: Right;     Family History  Problem Relation Age of Onset  . Birth defects Paternal Uncle      History   Social History  . Marital Status: Married    Spouse Name: N/A    Number of Children: N/A  . Years of    Education: N/A   Occupational History  . Not on file.   Social History Main Topics  . Smoking status: Former Smoker -- 1.00 packs/day for 5 years    Types: Cigarettes    Quit date: 05/06/2007  . Smokeless tobacco: Never Used     Comment: I PP PER WEEK FOR MAYBE 5 YRS-QUIT SMOKING ABOUT 2008  . Alcohol Use: No  . Drug Use: No  . Sexual Activity: Yes   Other Topics Concern  . Not on file   Social History Narrative  . No narrative on file     BP 100/68  Pulse 62  Ht 5' 3" (1.6 m)  Wt 247 lb 6.4 oz (112.22 kg)  BMI 43.84 kg/m2  Physical Exam:  stable appearing obese, middle-age woman,NAD HEENT: Unremarkable Neck:  No JVD, no thyromegally Back:  No CVA tenderness Lungs:  Clear with no wheezes, rales, or rhonchi. HEART:  Regular rate rhythm, no murmurs, no rubs, no clicks, split S2, S3 is present. Abd:  soft, positive bowel sounds, no organomegally, no rebound, no guarding Ext:  2 plus pulses, no edema, no cyanosis, no clubbing Skin:  No rashes no nodules Neuro:  CN II through XII intact, motor grossly intact  EKG - normal sinus rhythm with left bundle branch block   Assess/Plan: 

## 2013-05-02 NOTE — Telephone Encounter (Signed)
We do not give pain medication at discharge.  Patient understands

## 2013-05-03 ENCOUNTER — Encounter (HOSPITAL_COMMUNITY): Payer: Self-pay | Admitting: General Practice

## 2013-05-03 ENCOUNTER — Encounter (HOSPITAL_COMMUNITY)
Admission: RE | Disposition: A | Discharge status: Home / Self Care | Payer: Self-pay | Source: Ambulatory Visit | Attending: Internal Medicine

## 2013-05-03 ENCOUNTER — Telehealth: Payer: Self-pay | Admitting: Internal Medicine

## 2013-05-03 ENCOUNTER — Ambulatory Visit (HOSPITAL_COMMUNITY)
Admission: RE | Admit: 2013-05-03 | Discharge: 2013-05-04 | Disposition: A | Discharge status: Home / Self Care | Payer: Medicare Other | Source: Ambulatory Visit | Attending: Internal Medicine | Admitting: Internal Medicine

## 2013-05-03 DIAGNOSIS — E119 Type 2 diabetes mellitus without complications: Secondary | ICD-10-CM | POA: Insufficient documentation

## 2013-05-03 DIAGNOSIS — J45909 Unspecified asthma, uncomplicated: Secondary | ICD-10-CM | POA: Insufficient documentation

## 2013-05-03 DIAGNOSIS — F411 Generalized anxiety disorder: Secondary | ICD-10-CM | POA: Insufficient documentation

## 2013-05-03 DIAGNOSIS — E039 Hypothyroidism, unspecified: Secondary | ICD-10-CM | POA: Diagnosis not present

## 2013-05-03 DIAGNOSIS — I447 Left bundle-branch block, unspecified: Secondary | ICD-10-CM | POA: Diagnosis not present

## 2013-05-03 DIAGNOSIS — E785 Hyperlipidemia, unspecified: Secondary | ICD-10-CM | POA: Diagnosis not present

## 2013-05-03 DIAGNOSIS — Z853 Personal history of malignant neoplasm of breast: Secondary | ICD-10-CM | POA: Insufficient documentation

## 2013-05-03 DIAGNOSIS — I428 Other cardiomyopathies: Secondary | ICD-10-CM | POA: Insufficient documentation

## 2013-05-03 DIAGNOSIS — I129 Hypertensive chronic kidney disease with stage 1 through stage 4 chronic kidney disease, or unspecified chronic kidney disease: Secondary | ICD-10-CM | POA: Insufficient documentation

## 2013-05-03 DIAGNOSIS — Z87891 Personal history of nicotine dependence: Secondary | ICD-10-CM | POA: Insufficient documentation

## 2013-05-03 DIAGNOSIS — N189 Chronic kidney disease, unspecified: Secondary | ICD-10-CM | POA: Insufficient documentation

## 2013-05-03 DIAGNOSIS — I5022 Chronic systolic (congestive) heart failure: Secondary | ICD-10-CM | POA: Diagnosis present

## 2013-05-03 DIAGNOSIS — I509 Heart failure, unspecified: Secondary | ICD-10-CM | POA: Insufficient documentation

## 2013-05-03 HISTORY — DX: Shortness of breath: R06.02

## 2013-05-03 HISTORY — DX: Dependence on supplemental oxygen: Z99.81

## 2013-05-03 HISTORY — DX: Migraine, unspecified, not intractable, without status migrainosus: G43.909

## 2013-05-03 HISTORY — PX: BI-VENTRICULAR IMPLANTABLE CARDIOVERTER DEFIBRILLATOR  (CRT-D): SHX5747

## 2013-05-03 HISTORY — DX: Polyneuropathy, unspecified: G62.9

## 2013-05-03 HISTORY — PX: BI-VENTRICULAR IMPLANTABLE CARDIOVERTER DEFIBRILLATOR: SHX5459

## 2013-05-03 LAB — SURGICAL PCR SCREEN
MRSA, PCR: POSITIVE — AB
Staphylococcus aureus: POSITIVE — AB

## 2013-05-03 SURGERY — BI-VENTRICULAR IMPLANTABLE CARDIOVERTER DEFIBRILLATOR  (CRT-D)
Anesthesia: LOCAL

## 2013-05-03 MED ORDER — DIGOXIN 125 MCG PO TABS
0.1250 mg | ORAL_TABLET | Freq: Every day | ORAL | Status: DC
  Administered 2013-05-04: 0.125 mg via ORAL
  Filled 2013-05-03 (×2): qty 1

## 2013-05-03 MED ORDER — ALBUTEROL SULFATE HFA 108 (90 BASE) MCG/ACT IN AERS
2.0000 | INHALATION_SPRAY | Freq: Four times a day (QID) | RESPIRATORY_TRACT | Status: DC | PRN
  Filled 2013-05-03: qty 6.7

## 2013-05-03 MED ORDER — DOCUSATE SODIUM 100 MG PO CAPS
100.0000 mg | ORAL_CAPSULE | Freq: Two times a day (BID) | ORAL | Status: DC
Start: 2013-05-03 — End: 2013-05-04
  Administered 2013-05-04: 100 mg via ORAL
  Filled 2013-05-03 (×3): qty 1

## 2013-05-03 MED ORDER — FENTANYL CITRATE 0.05 MG/ML IJ SOLN
INTRAMUSCULAR | Status: AC
  Filled 2013-05-03: qty 2

## 2013-05-03 MED ORDER — MIDAZOLAM HCL 5 MG/5ML IJ SOLN
INTRAMUSCULAR | Status: AC
  Filled 2013-05-03: qty 5

## 2013-05-03 MED ORDER — POLYETHYLENE GLYCOL 3350 17 G PO PACK
17.0000 g | PACK | Freq: Two times a day (BID) | ORAL | Status: DC
  Filled 2013-05-03 (×3): qty 1

## 2013-05-03 MED ORDER — ARIPIPRAZOLE 2 MG PO TABS
2.0000 mg | ORAL_TABLET | Freq: Every morning | ORAL | Status: DC
  Administered 2013-05-04: 09:00:00 2 mg via ORAL
  Filled 2013-05-03: qty 1

## 2013-05-03 MED ORDER — SPIRONOLACTONE 25 MG PO TABS
25.0000 mg | ORAL_TABLET | Freq: Every day | ORAL | Status: DC
  Administered 2013-05-04: 09:00:00 25 mg via ORAL
  Filled 2013-05-03 (×2): qty 1

## 2013-05-03 MED ORDER — SODIUM CHLORIDE 0.9 % IV SOLN: INTRAVENOUS | Status: DC

## 2013-05-03 MED ORDER — MOMETASONE FURO-FORMOTEROL FUM 100-5 MCG/ACT IN AERO
2.0000 | INHALATION_SPRAY | Freq: Two times a day (BID) | RESPIRATORY_TRACT | Status: DC
  Administered 2013-05-03 – 2013-05-04 (×2): 2 via RESPIRATORY_TRACT
  Filled 2013-05-03: qty 8.8

## 2013-05-03 MED ORDER — ONDANSETRON HCL 4 MG/2ML IJ SOLN: 4.0000 mg | Freq: Four times a day (QID) | INTRAMUSCULAR | Status: DC | PRN

## 2013-05-03 MED ORDER — SIMVASTATIN 10 MG PO TABS
10.0000 mg | ORAL_TABLET | Freq: Every evening | ORAL | Status: DC
  Administered 2013-05-03: 21:00:00 10 mg via ORAL
  Filled 2013-05-03 (×2): qty 1

## 2013-05-03 MED ORDER — MUPIROCIN 2 % EX OINT
TOPICAL_OINTMENT | Freq: Two times a day (BID) | CUTANEOUS | Status: DC
  Administered 2013-05-03: 1 via NASAL
  Filled 2013-05-03: qty 22

## 2013-05-03 MED ORDER — LIDOCAINE HCL (PF) 1 % IJ SOLN
INTRAMUSCULAR | Status: AC
  Filled 2013-05-03: qty 60

## 2013-05-03 MED ORDER — ZOLPIDEM TARTRATE 5 MG PO TABS
10.0000 mg | ORAL_TABLET | Freq: Every day | ORAL | Status: DC
  Filled 2013-05-03: qty 2

## 2013-05-03 MED ORDER — LEVOTHYROXINE SODIUM 125 MCG PO TABS
125.0000 ug | ORAL_TABLET | Freq: Every morning | ORAL | Status: DC
  Administered 2013-05-04: 125 ug via ORAL
  Filled 2013-05-03: qty 1

## 2013-05-03 MED ORDER — ACETAMINOPHEN 325 MG PO TABS
325.0000 mg | ORAL_TABLET | ORAL | Status: DC | PRN
  Administered 2013-05-03 – 2013-05-04 (×2): 650 mg via ORAL
  Filled 2013-05-03 (×2): qty 2

## 2013-05-03 MED ORDER — LISINOPRIL 2.5 MG PO TABS
2.5000 mg | ORAL_TABLET | Freq: Every day | ORAL | Status: DC
  Administered 2013-05-03: 2.5 mg via ORAL
  Filled 2013-05-03 (×2): qty 1

## 2013-05-03 MED ORDER — GABAPENTIN 300 MG PO CAPS
300.0000 mg | ORAL_CAPSULE | Freq: Two times a day (BID) | ORAL | Status: DC
  Administered 2013-05-03 – 2013-05-04 (×2): 300 mg via ORAL
  Filled 2013-05-03 (×3): qty 1

## 2013-05-03 MED ORDER — VANCOMYCIN HCL 10 G IV SOLR
1250.0000 mg | Freq: Two times a day (BID) | INTRAVENOUS | Status: AC
  Administered 2013-05-03: 1250 mg via INTRAVENOUS
  Filled 2013-05-03: qty 1250

## 2013-05-03 MED ORDER — BUPROPION HCL ER (XL) 300 MG PO TB24
300.0000 mg | ORAL_TABLET | Freq: Every morning | ORAL | Status: DC
  Administered 2013-05-04: 09:00:00 300 mg via ORAL
  Filled 2013-05-03: qty 1

## 2013-05-03 MED ORDER — CARVEDILOL 3.125 MG PO TABS
3.1250 mg | ORAL_TABLET | Freq: Two times a day (BID) | ORAL | Status: DC
  Administered 2013-05-04: 09:00:00 3.125 mg via ORAL
  Filled 2013-05-03 (×2): qty 1

## 2013-05-03 MED ORDER — MUPIROCIN 2 % EX OINT
TOPICAL_OINTMENT | CUTANEOUS | Status: AC
  Filled 2013-05-03: qty 22

## 2013-05-03 MED ORDER — FEBUXOSTAT 40 MG PO TABS
40.0000 mg | ORAL_TABLET | Freq: Every day | ORAL | Status: DC
  Administered 2013-05-03: 21:00:00 40 mg via ORAL
  Filled 2013-05-03 (×2): qty 1

## 2013-05-03 MED ORDER — METHOCARBAMOL 500 MG PO TABS: 500.0000 mg | ORAL_TABLET | Freq: Four times a day (QID) | ORAL | Status: DC | PRN

## 2013-05-03 MED ORDER — FERROUS SULFATE 325 (65 FE) MG PO TABS
325.0000 mg | ORAL_TABLET | Freq: Three times a day (TID) | ORAL | Status: DC
  Administered 2013-05-04: 09:00:00 325 mg via ORAL
  Filled 2013-05-03 (×5): qty 1

## 2013-05-03 MED ORDER — CHLORHEXIDINE GLUCONATE 4 % EX LIQD
60.0000 mL | Freq: Once | CUTANEOUS | Status: DC
  Filled 2013-05-03: qty 60

## 2013-05-03 MED ORDER — TORSEMIDE 20 MG PO TABS
20.0000 mg | ORAL_TABLET | Freq: Every day | ORAL | Status: DC
  Administered 2013-05-04: 20 mg via ORAL
  Filled 2013-05-03 (×2): qty 1

## 2013-05-03 NOTE — Telephone Encounter (Signed)
No lotion, brandon informed patient no lotion okay for deodorant

## 2013-05-03 NOTE — Telephone Encounter (Signed)
New Problem:  Pt is calling to speak with Tresa Endo. Pt wants to know can she put on lotion and deoderant before her procedure.

## 2013-05-03 NOTE — CV Procedure (Signed)
BiV ICD implant via the left subclavian vein without immediate complication. [id number].

## 2013-05-03 NOTE — H&P (View-Only) (Signed)
HPI [name] is referred today by her congestive heart failure clinic for consideration for biventricular ICD implantation. She is a very pleasant 62 year old woman with a history of nonischemic cardiomyopathy, with severe left ventricular dysfunction. On maximal medical therapy, and echocardiogram was obtained back in may of 2014 which demonstrated an ejection fraction of 35%. Attempts to up titrate her medical therapy was then carried out and a repeat echo was performed several weeks ago which demonstrated an ejection fraction of 20-25%. She has class III heart failure symptoms. She has left bundle branch block with a QRS duration of 165 ms. She has not had syncope. She denies chest pain. She has minimal peripheral edema. Allergies  Allergen Reactions  . Ceftin Anaphylaxis    Face and throat swell   . Shellfish Allergy Other (See Comments)    Gout exacerbation  . Allopurinol Nausea Only and Other (See Comments)    weakness  . Lorazepam Itching  . Sulfa Antibiotics Itching  . Ultram [Tramadol Hcl] Itching     Current Outpatient Prescriptions  Medication Sig Dispense Refill  . albuterol (PROVENTIL HFA;VENTOLIN HFA) 108 (90 BASE) MCG/ACT inhaler Inhale 2 puffs into the lungs every 6 (six) hours as needed. Wheezing       . albuterol (PROVENTIL) (2.5 MG/3ML) 0.083% nebulizer solution Take 2.5 mg by nebulization every 6 (six) hours as needed. Wheezing       . ARIPiprazole (ABILIFY) 2 MG tablet Take 2 mg by mouth every morning.       Marland Kitchen buPROPion (WELLBUTRIN XL) 150 MG 24 hr tablet Take 300 mg by mouth every morning.       . carvedilol (COREG) 3.125 MG tablet Take 1 tablet in the am and 2 tablets in the pm  90 tablet  3  . clonazePAM (KLONOPIN) 0.5 MG tablet Take 0.5 mg by mouth 2 (two) times daily as needed. 1 in morning, 1 at bedtime      . digoxin (LANOXIN) 0.125 MG tablet Take 1 tablet (0.125 mg total) by mouth daily.  30 tablet  6  . docusate sodium 100 MG CAPS Take 100 mg by  mouth 2 (two) times daily.  10 capsule    . febuxostat (ULORIC) 40 MG tablet Take 40 mg by mouth at bedtime.       . ferrous sulfate 325 (65 FE) MG tablet Take 1 tablet (325 mg total) by mouth 3 (three) times daily after meals.      . Fluticasone-Salmeterol (ADVAIR) 250-50 MCG/DOSE AEPB Inhale 1 puff into the lungs every 12 (twelve) hours.       . gabapentin (NEURONTIN) 300 MG capsule Take 300 mg by mouth 2 (two) times daily. May take 3 times daily as needed      . ibuprofen (ADVIL,MOTRIN) 800 MG tablet Take 800 mg by mouth 3 (three) times daily as needed.      Marland Kitchen levothyroxine (SYNTHROID, LEVOTHROID) 125 MCG tablet Take 125 mcg by mouth every morning. On an empty stomach      . lisinopril (ZESTRIL) 2.5 MG tablet Take 1 tablet (2.5 mg total) by mouth at bedtime.  30 tablet  3  . methocarbamol (ROBAXIN) 500 MG tablet Take 1 tablet (500 mg total) by mouth every 6 (six) hours as needed (muscle spasms).  50 tablet  0  . polyethylene glycol (MIRALAX / GLYCOLAX) packet Take 17 g by mouth 2 (two) times daily.  14 each    . potassium chloride SA (K-DUR,KLOR-CON) 20 MEQ  tablet Take 2 tabs in AM and 1 tab in PM  90 tablet  3  . simvastatin (ZOCOR) 10 MG tablet Take 10 mg by mouth every evening.       Marland Kitchen spironolactone (ALDACTONE) 25 MG tablet Take 1 tablet (25 mg total) by mouth daily.  30 tablet  3  . torsemide (DEMADEX) 20 MG tablet Take 2 tabs in AM and 1 tab in PM      . zolpidem (AMBIEN) 10 MG tablet Take 10 mg by mouth at bedtime as needed. For sleep       No current facility-administered medications for this visit.     Past Medical History  Diagnosis Date  . Unspecified vitamin D deficiency 03/26/2011    does not take meds  . Asthma   . Diabetes mellitus   . Hypertension   . Heart murmur   . Cardiomyopathy     [name]  . Shortness of breath     WITH EXERTION  . Hypothyroidism   . Anemia   . GERD (gastroesophageal reflux disease)     NEXIUM ONLY IF NEEDED  .  Breast cancer, stage 1 03/26/2011    left-FINISHED CHEMO  AND RADIATION  . Arthritis     RIGHT KNEE ARTHRITIS AND PAIN-PT TOLD BONE ON BONE  . Depression   . Back pain     DISK PROBLEM  . Complication of anesthesia     PT STATES HER B/P LOW AFTER ONE OF HER SURGERIES--SHE ATTRIBUTES TO LYING FLAT  . CHF (congestive heart failure)     CHRONIC SYSTOLIC HEART FAILURE-PER [facility] NOTE 07/07/11   . Left bundle branch block     CHRONIC PER [facility] NOTES 07/07/11.  . Gout   . SVD (spontaneous vaginal delivery)     x 2  . Hyperlipidemia   . Anxiety   . Headache(784.0)      hx - migraines, last one 3 mos ago, uses otc med prn  . Dry eyes, bilateral   . Neuromuscular disorder     feet -neuropathy    ROS:   All systems reviewed and negative except as noted in the HPI.   Past Surgical History  Procedure Laterality Date  . Portacath placement  2012  . 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 Cardiac catheterization  2010    NORMAL CORONARY ARTERIES  . Port-a-cath removal  09/23/2011    Procedure: REMOVAL PORT-A-CATH;  Surgeon: Emelia Loron, MD;  Location: [facility];  Service: General;  Laterality: N/A;  Port Removal  . Breast surgery  07/28/2010    lumpectomy , snbx - left  . Wisdom tooth extraction    . Colonoscopy    . Hysteroscopy w/d&c  01/20/2012    Procedure: DILATATION AND CURETTAGE /HYSTEROSCOPY;  Surgeon: Meriel Pica, MD;  Location: [facility];  Service: Gynecology;  Laterality: N/A;  with trueclear  . Total knee arthroplasty  05/10/2012    Procedure: TOTAL KNEE ARTHROPLASTY;  Surgeon: Shelda Pal, MD;  Location: [facility];  Service: Orthopedics;  Laterality: Right;     Family History  Problem Relation Age of Onset  . Birth defects Paternal Uncle      History   Social History  . Marital Status: Married    Spouse Name: N/A    Number of Children: N/A  . Years of  Education: N/A   Occupational History  . Not on file.   Social History Main Topics  . Smoking status: Former Smoker -- 1.00 packs/day for 5 years    Types: Cigarettes    Quit date: 05/06/2007  . Smokeless tobacco: Never Used     Comment: I PP PER WEEK FOR MAYBE 5 YRS-QUIT SMOKING ABOUT 2008  . Alcohol Use: No  . Drug Use: No  . Sexual Activity: Yes   Other Topics Concern  . Not on file   Social History Narrative  . No narrative on file     BP 100/68  Pulse 62  Ht 5\' 3"  (1.6 m)  Wt 247 lb 6.4 oz (112.22 kg)  BMI 43.84 kg/m2  Physical Exam:  stable appearing obese, middle-age woman,NAD HEENT: Unremarkable Neck:  No JVD, no thyromegally Back:  No CVA tenderness Lungs:  Clear with no wheezes, rales, or rhonchi. HEART:  Regular rate rhythm, no murmurs, no rubs, no clicks, split S2, S3 is present. Abd:  soft, positive bowel sounds, no organomegally, no rebound, no guarding Ext:  2 plus pulses, no edema, no cyanosis, no clubbing Skin:  No rashes no nodules Neuro:  CN II through XII intact, motor grossly intact  EKG - normal sinus rhythm with left bundle branch block   Assess/Plan:

## 2013-05-03 NOTE — Interval H&P Note (Signed)
History and Physical Interval Note: Patient seen and examined. Agree with above. 05/03/2013 2:15 PM  Rhonda [name]  has presented today for surgery, with the diagnosis of chf  The various methods of treatment have been discussed with the patient and family. After consideration of risks, benefits and other options for treatment, the patient has consented to  Procedure(s): BI-VENTRICULAR IMPLANTABLE CARDIOVERTER DEFIBRILLATOR  (CRT-D) (N/A) as a surgical intervention .  The patient's history has been reviewed, patient examined, no change in status, stable for surgery.  I have reviewed the patient's chart and labs.  Questions were answered to the patient's satisfaction.     Lewayne Bunting, M.D.

## 2013-05-03 NOTE — Interval H&P Note (Signed)
ICD Criteria  Current LVEF (within 6 months):25%%  NYHA Functional Classification: Class III  Heart Failure History:  Yes, Duration of heart failure since onset is 3 to 9 months  Non-Ischemic Dilated Cardiomyopathy History:  Yes, timeframe is 3 to 9 months  Atrial Fibrillation/Atrial Flutter:  no  Ventricular Tachycardia History:  No.  Cardiac Arrest History:  No  History of Syndromes with Risk of Sudden Death:  No.  Previous ICD:  No.  Electrophysiology Study: No.  Prior MI: No.  PPM: No.  OSA:  No  Patient Life Expectancy of >=1 year: Yes.  Anticoagulation Therapy:  Patient is NOT on anticoagulation therapy.   Beta Blocker Therapy:  Yes.   Ace Inhibitor/ARB Therapy:  Yes.History and Physical Interval Note:  05/03/2013 2:16 PM  Rhonda [name]  has presented today for surgery, with the diagnosis of chf  The various methods of treatment have been discussed with the patient and family. After consideration of risks, benefits and other options for treatment, the patient has consented to  Procedure(s): BI-VENTRICULAR IMPLANTABLE CARDIOVERTER DEFIBRILLATOR  (CRT-D) (N/A) as a surgical intervention .  The patient's history has been reviewed, patient examined, no change in status, stable for surgery.  I have reviewed the patient's chart and labs.  Questions were answered to the patient's satisfaction.     Lewayne Bunting

## 2013-05-03 NOTE — Progress Notes (Signed)
Orthopedic Tech Progress Note Patient Details:  Rhonda [name] 02/09/1951 [id number] replacement Ortho Devices Type of Ortho Device: Arm sling Ortho Device/Splint Interventions: Casandra Doffing 05/03/2013, 7:37 PM

## 2013-05-04 ENCOUNTER — Ambulatory Visit (HOSPITAL_COMMUNITY): Payer: Medicare Other

## 2013-05-04 DIAGNOSIS — R9389 Abnormal findings on diagnostic imaging of other specified body structures: Secondary | ICD-10-CM | POA: Diagnosis not present

## 2013-05-04 DIAGNOSIS — I428 Other cardiomyopathies: Secondary | ICD-10-CM | POA: Diagnosis not present

## 2013-05-04 MED ORDER — YOU HAVE A PACEMAKER BOOK
Freq: Once | Status: AC
  Administered 2013-05-04: 02:00:00
  Filled 2013-05-04: qty 1

## 2013-05-04 MED ORDER — ACETAMINOPHEN 500 MG PO TABS
1000.0000 mg | ORAL_TABLET | Freq: Four times a day (QID) | ORAL | Status: DC | PRN
  Administered 2013-05-04: 09:00:00 1000 mg via ORAL
  Filled 2013-05-04: qty 2

## 2013-05-04 NOTE — Op Note (Signed)
Rhonda [name], Rhonda [name] NO.:  [id number]  MEDICAL RECORD NO.:  [id number]  LOCATION:  6C05C                        FACILITY:  [facility]  PHYSICIAN:  Doylene Canning. Ladona Ridgel, MD    DATE OF BIRTH:  November 30, 1950  DATE OF PROCEDURE:  05/03/2013 DATE OF DISCHARGE:  05/04/2013                              OPERATIVE REPORT   PROCEDURE PERFORMED:  Insertion of a biventricular ICD.  INDICATION:  Longstanding nonischemic cardiomyopathy with chronic systolic heart failure class III, ejection fraction 20% by echo, greater than 6 months, despite maximal medical therapy with the patient having left bundle-branch block, QRS duration of 170 milliseconds.  INTRODUCTION:  The patient is a 62 year old woman followed by our heart failure clinic, who is referred for BiV ICD implantation.  Her history is as previously noted.  She presents for device implantation.  DESCRIPTION OF PROCEDURE:  After informed consent was obtained, the patient was taken to the diagnostic EP lab in a fasting state.  After usual preparation and draping, intravenous fentanyl and midazolam was given for sedation.  A 30 mL of lidocaine was infiltrated into the left infraclavicular region.  A 7 cm incision was carried out over this region and electrocautery was utilized to dissect down to the ICD pocket.  The attempts to puncture the subclavian vein were carried out and were successful, but a guidewire could not be advanced.  A 10 mL of contrast was injected into the left upper extremity venous system demonstrating that the vein was patent, but quite small.  It was then re- punctured and a glidewire was advanced into the central circulation. Two additional attempts to puncture the subclavian vein were carried out and successful, but the J-wire would not be advanced and a 2 Wholey wires were then advanced into the central circulation.  The 9-French Medtronic single coil active fixation defibrillation lead, model 6935, 58 cm  lead serial #[id number] was advanced under fluoroscopic guidance into the right ventricle and the Medtronic model 5076, 45 cm active fixation pacing lead serial #[id number] was advanced to the right atrium.  Mapping was first carried out in the right ventricle.  At the final site, the R-waves measured 28 mV and the pacing impedance with lead actively fixed was 880 ohms, the threshold 0.5 V at 0.5 milliseconds, and 10 V pacing did not stimulate diaphragm.  With the ventricular lead in satisfactory position, attention was then turned to placement of the atrial lead was placed in anterolateral portion of the right atrium with the P-waves measured 3 mV.  The pacing impedance was 660 ohms and the threshold was 0.5 V at 0.5 milliseconds with lead actively fixed.  Again 10 V pacing did not stimulate the diaphragm. With both the atrial and ventricular leads in satisfactory position, attention was then turned to placement of the left ventricular lead. The 7-French guiding catheter was advanced along with a 6-French hexapolar EP catheter through a 9-French sheath and into the right atrium.  The coronary sinus was cannulated without particular difficulty.  Venography of the coronary sinus was carried out.  This demonstrated a small high lateral vein, a small lateral vein, and a larger posterior vein.  Initially, the lateral  vein was selected for LV lead placement.  The Medtronic quadripolar LV lead was advanced over several different angioplasty guidewires, but could not be advanced sufficiently to accommodate the size of the lateral vein.  The high lateral vein was then evaluated and attempts to place the quadripolar LV lead into the high lateral vein were carried out, but again this vein was too small.  At this point, the guiding catheter was withdrawn down to the region of the posterior vein which was very close to the middle cardiac vein.  The posterior vein was cannulated and the  quadripolar lead was advanced out approximately three-quarter based apex.  In this location pacing of the most proximal pole, there was a satisfactory left ventricular capture, but the location of the proximal pole peaked to the RV, pacing electrode was fairly short and it was felt that this would not be the best location for LV lead placement from the perspective of improving this synchrony.  At this point, the Medtronic model 4396, 88 cm bipolar pacing lead was advanced into the coronary sinus, and out into the lateral vein.  It was advanced approximately one-half base to apex.  In this location, the lead tip appeared to be stable.  The pacing threshold was less than a V at 0.5 milliseconds.  With these satisfactory parameters, the guiding catheter was liberated from the LV lead and the LV, RV, and atrial leads were secured to the fascial plane with silk suture.  The sewing sleeve was also secured with silk suture. Electrocautery was utilized to make a subcutaneous pocket.  Antibiotic irrigation was utilized to irrigate the pocket  and electrocautery was utilized to assure hemostasis.  The Medtronic Viva XT CRT-D biventricular ICD serial [id number] was connected to the atrial RV and LV leads and placed back in the subcutaneous pocket.  The pocket was irrigated with additional antibiotic irrigation and the incision was closed with 2-0 and 3-0 Vicryl.  At this point, I scrubbed out of the case to supervise defibrillation threshold testing.  After the patient more deeply sedated under my direct supervision with additional intravenous fentanyl and Versed, ventricular fibrillation was induced with T-wave shock.  A 20-joule shock was delivered after appropriate sensing which terminated ventricular fibrillation and restored sinus rhythm.  No additional defibrillation threshold testing was carried out, and the benzoin Steri-Strips were painted on the skin, a pressure dressing applied and the  patient returned to her room in satisfactory condition.  COMPLICATIONS:  There were no immediate procedure complications.  RESULTS:  Demonstrates successful implantation of the Medtronic biventricular ICD in a patient with nonischemic cardiomyopathy, chronic systolic heart failure, ejection fraction 20%, with heart failure symptoms despite maximal medical therapy of greater than 6 months.     Doylene Canning. Ladona Ridgel, MD     GWT/MEDQ  D:  05/03/2013  T:  05/04/2013  Job:  [id number]  cc:   Bevelyn Buckles. Bensimhon, MD

## 2013-05-04 NOTE — Discharge Summary (Signed)
ELECTROPHYSIOLOGY DISCHARGE SUMMARY    Patient ID: Rhonda [name],  MRN: [id number], DOB/AGE: 1951-03-06 62 y.o.  Admit date: 05/03/2013 Discharge date: 05/04/2013  Primary Care Physician: Shirlean Mylar, MD Primary Cardiologist: Armanda Magic, MD Primary CHF: Gala Romney, MD Primary EP: Ladona Ridgel, MD  Primary Discharge Diagnosis:  1. NICM, EF 25%, s/p CRT-D implant  2. Chronic systolic HF  3. LBBB  Secondary Discharge Diagnoses:  1. HTN 2. DM 3. Asthma 4. Hypothyroidism 5. Dyslipidemia 6. Anxiety 7. History of breast CA 8. History of gout 9. CKD  Procedures This Admission:  1. BiV ICD implantation (Medtronic) - please see operative note for full details (dictated but not available at the time of discharge)  History and Hospital Course:  Rhonda [name] is a pleasant 62 year old woman with a NICM, EF 20-25%, chronic systolic HF and LBBB who has persistent LV dysfunction despite optimal medical therapy. She presented yesterday for BiV ICD implantation for CRT and primary prevention of SCD. She tolerated this procedure well without any immediate complication. She remains hemodynamically stable and afebrile. Her chest xray shows stable lead placement without pneumothorax. Her device interrogation shows normal BiV ICD function with stable lead parameters/measurements. Her implant site is intact without significant bleeding or hematoma. She has been given discharge instructions including wound care and activity restrictions. She will follow-up in 10 days for wound check. There were no changes made to her medications. Her HF medications and BMET are being followed closely in HF clinic. She has been seen, examined and deemed stable for discharge today by Dr. Lewayne Bunting.  Discharge Vitals: Blood pressure 102/44, pulse 77, temperature 97.7 F (36.5 C), temperature source Oral, resp. rate 18, height 5\' 2"  (1.575 m), weight 248 lb 7.3 oz (112.7 kg), SpO2 95.00%.   Labs: Lab Results  Component Value  Date   WBC 9.1 05/02/2013   HGB 11.2* 05/02/2013   HCT 34.4* 05/02/2013   MCV 99.2 05/02/2013   PLT 421.0* 05/02/2013     Recent Labs Lab 05/02/13 1150  NA 130*  K 4.2  CL 92*  CO2 27  BUN 42*  CREATININE 1.8*  CALCIUM 9.0  GLUCOSE 105*    Disposition:  The patient is being discharged in stable condition.  Follow-up:     Follow-up Information   Follow up with [facility] On 05/10/2013. (At 1:00 PM for HF clinic follow-up)    Specialty:  Cardiology   Contact information:   [address] [id number] [place] [address] [phone]      Follow up with [facility] On 05/15/2013. (At 10:30 AM for wound check)    Specialty:  Cardiology   Contact information:   [address] [place] [address] [phone]      Follow up with Lewayne Bunting, MD On 08/04/2013. (At 9:45 AM)    Specialty:  Cardiology   Contact information:   [address] [place] [address] [phone]      Discharge Medications:    Medication List    ASK your doctor about these medications       albuterol (2.5 MG/3ML) 0.083% nebulizer solution  Commonly known as:  PROVENTIL  - Take 2.5 mg by nebulization every 6 (six) hours as needed. Wheezing  -      albuterol 108 (90 BASE) MCG/ACT inhaler  Commonly known as:  PROVENTIL HFA;VENTOLIN HFA  - Inhale 2 puffs into the lungs every  6 (six) hours as needed. Wheezing  -      ARIPiprazole 2 MG tablet  Commonly known as:  ABILIFY  Take 2 mg by mouth every morning.     buPROPion 150 MG 24 hr tablet  Commonly known as:  WELLBUTRIN XL  Take 300 mg by mouth every morning.     carvedilol 3.125 MG tablet  Commonly known as:  COREG  Take 1 tablet in the am and 2 tablets in the pm     clonazePAM 0.5 MG tablet  Commonly known as:  KLONOPIN  Take 0.5 mg by mouth 2 (two) times daily as needed. 1 in morning, 1 at bedtime     digoxin 0.125  MG tablet  Commonly known as:  LANOXIN  Take 1 tablet (0.125 mg total) by mouth daily.     DSS 100 MG Caps  Take 100 mg by mouth 2 (two) times daily.     ferrous sulfate 325 (65 FE) MG tablet  Take 1 tablet (325 mg total) by mouth 3 (three) times daily after meals.     Fluticasone-Salmeterol 250-50 MCG/DOSE Aepb  Commonly known as:  ADVAIR  Inhale 1 puff into the lungs every 12 (twelve) hours.     gabapentin 300 MG capsule  Commonly known as:  NEURONTIN  Take 300 mg by mouth 2 (two) times daily. May take 3 times daily as needed     ibuprofen 800 MG tablet  Commonly known as:  ADVIL,MOTRIN  Take 800 mg by mouth 3 (three) times daily as needed.     levothyroxine 125 MCG tablet  Commonly known as:  SYNTHROID, LEVOTHROID  Take 125 mcg by mouth every morning. On an empty stomach     lisinopril 2.5 MG tablet  Commonly known as:  ZESTRIL  Take 1 tablet (2.5 mg total) by mouth at bedtime.     methocarbamol 500 MG tablet  Commonly known as:  ROBAXIN  Take 1 tablet (500 mg total) by mouth every 6 (six) hours as needed (muscle spasms).     polyethylene glycol packet  Commonly known as:  MIRALAX / GLYCOLAX  Take 17 g by mouth 2 (two) times daily.     potassium chloride SA 20 MEQ tablet  Commonly known as:  K-DUR,KLOR-CON  Take 2 tabs in AM and 1 tab in PM     simvastatin 10 MG tablet  Commonly known as:  ZOCOR  Take 10 mg by mouth every evening.     spironolactone 25 MG tablet  Commonly known as:  ALDACTONE  Take 1 tablet (25 mg total) by mouth daily.     torsemide 20 MG tablet  Commonly known as:  DEMADEX  Take 2 tabs in AM and 1 tab in PM     ULORIC 40 MG tablet  Generic drug:  febuxostat  Take 40 mg by mouth at bedtime.     zolpidem 10 MG tablet  Commonly known as:  AMBIEN  Take 10 mg by mouth at bedtime as needed. For sleep       Duration of Discharge Encounter: Greater than 30 minutes including physician time.  Signed, Rick Duff, PA-C 05/04/2013,  9:01 AM

## 2013-05-04 NOTE — Progress Notes (Signed)
    Patient: PIETRA ZULUAGA Date of Encounter: 05/04/2013, 7:12 AM Admit date: 05/03/2013     Subjective  Ms. Buerkle reports soreness at implant site. Otherwise without complaint. She denies CP or SOB.   Objective  Physical Exam: Vitals: BP 102/47  Pulse 77  Temp(Src) 97.7 F (36.5 C) (Axillary)  Resp 20  Ht 5\' 2"  (1.575 m)  Wt 248 lb 7.3 oz (112.7 kg)  BMI 45.43 kg/m2  SpO2 94% General: Well developed, well appearing, overweight 62 year old female in no acute distress. Neck: Supple. JVD not elevated. Lungs: Distant but clear bilaterally to auscultation without wheezes, rales, or rhonchi. Breathing is unlabored. Heart: Distant heart tones. RRR S1 S2 without murmurs, rubs, or gallops.  Abdomen: Soft, non-distended. Extremities: No clubbing or cyanosis. No edema.  Distal pedal pulses are 2+ and equal bilaterally. Neuro: Alert and oriented X 3. Moves all extremities spontaneously. No focal deficits. Skin: Left upper chest / implant site intact without bleeding or hematoma.  Intake/Output:  Intake/Output Summary (Last 24 hours) at 05/04/13 1610 Last data filed at 05/03/13 2350  Gross per 24 hour  Intake    120 ml  Output   1000 ml  Net   -880 ml    Inpatient Medications:  . ARIPiprazole  2 mg Oral q morning - 10a  . buPROPion  300 mg Oral q morning - 10a  . carvedilol  3.125 mg Oral BID WC  . digoxin  0.125 mg Oral Daily  . docusate sodium  100 mg Oral BID  . febuxostat  40 mg Oral QHS  . ferrous sulfate  325 mg Oral TID PC  . gabapentin  300 mg Oral BID  . levothyroxine  125 mcg Oral q morning - 10a  . lisinopril  2.5 mg Oral QHS  . mometasone-formoterol  2 puff Inhalation BID  . polyethylene glycol  17 g Oral BID  . simvastatin  10 mg Oral QPM  . spironolactone  25 mg Oral Daily  . torsemide  20 mg Oral Daily  . zolpidem  10 mg Oral QHS    Labs:  Recent Labs  05/02/13 1150  NA 130*  K 4.2  CL 92*  CO2 27  GLUCOSE 105*  BUN 42*  CREATININE 1.8*  CALCIUM  9.0    Recent Labs  05/02/13 1150  WBC 9.1  NEUTROABS 6.6  HGB 11.2*  HCT 34.4*  MCV 99.2  PLT 421.0*    Radiology/Studies: CXR: this AM - reviewed and shows stable lead placement; official report pending Device interrogation: this AM pending Telemetry: A sensed V paced; no arrhythmias   Assessment and Plan  1. NICM, EF 25%, s/p CRT-D implant 2. Chronic systolic HF 3. LBBB  Ms. Bucaro is doing well post CRT-D implant. Wound is intact without hematoma. CXR shows stable lead placement. Device interrogation demonstrates normal function. will plan to DC home today. Wound care and activity restrictions reviewed.  Dr. Ladona Ridgel to see Signed, Exie Parody  EP Attending  Patient seen and examined. Agree with above. Ok for discharge.  Leonia Reeves.D.

## 2013-05-08 ENCOUNTER — Other Ambulatory Visit (HOSPITAL_COMMUNITY): Payer: Self-pay | Admitting: *Deleted

## 2013-05-08 ENCOUNTER — Encounter (HOSPITAL_COMMUNITY): Payer: Self-pay | Admitting: *Deleted

## 2013-05-08 MED ORDER — LISINOPRIL 2.5 MG PO TABS: 2.5000 mg | ORAL_TABLET | Freq: Every day | ORAL | Status: DC

## 2013-05-10 ENCOUNTER — Ambulatory Visit (HOSPITAL_COMMUNITY)
Admission: RE | Admit: 2013-05-10 | Discharge: 2013-05-10 | Disposition: A | Discharge status: Home / Self Care | Payer: Medicare Other | Source: Ambulatory Visit | Attending: Internal Medicine | Admitting: Internal Medicine

## 2013-05-10 ENCOUNTER — Other Ambulatory Visit: Payer: Medicare Other

## 2013-05-10 ENCOUNTER — Encounter (HOSPITAL_COMMUNITY): Payer: Self-pay

## 2013-05-10 VITALS — BP 114/60 | HR 81 | Ht 62.0 in | Wt 248.8 lb

## 2013-05-10 DIAGNOSIS — I5022 Chronic systolic (congestive) heart failure: Secondary | ICD-10-CM

## 2013-05-10 NOTE — Progress Notes (Signed)
Patient ID: Rhonda [name], female   DOB: June 01, 1950, 62 y.o.   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 She has had sleep study one year ago- no evidence of sleep apnea.   10/14/12 ECHO EF 35% RV ok  04/12/13 ECHO EF 20-25%, LV moderately dilated  10/18/12 Creatinine 1.3 Potassium 3.5 11/16/12 Creatinine 1.69 Potassium 2.7  11/24/12 Creatinine 1.23 Potassium 4.1 12/15/12 Creatinine 2.03 Potassium 3.3  12/22/12 Creatinine 1.2 Potassium 3.8 02/23/13 Creatinine 1.68 K 4.4  05/02/13 K 4.2 Creatinine 1.8   She returns for follow up. Last visit she was started on digoxin. She is S/P [employer]  BiVi  05/04/13 . Complains of incisional pain. Denies SOB/dizziness.  Sleeps with hob elevated for incisional pain. She continues to wear oxygen at night. Still has mild fatigue but can vacuum which is an improvement. Weight at home 245. She has not required extra torsemide.  No LE edema.  Compliant with medications.    ROS: All systems negative except as listed in HPI, PMH and Problem List.  Past Medical History  Diagnosis Date  . Unspecified vitamin D deficiency 03/26/2011    does not take meds  . Asthma   . Hypertension   . Heart murmur   . Cardiomyopathy     PT'S CARDIOLOGIST IS DR. Gloris Manchester [name]  . Hypothyroidism   . Anemia   . Breast cancer, stage 1 03/26/2011    left-FINISHED CHEMO  AND RADIATION  . Arthritis     RIGHT KNEE ARTHRITIS AND PAIN-PT TOLD BONE ON BONE  . Depression   . Back  pain     DISK PROBLEM  . Complication of anesthesia     PT STATES HER B/P LOW AFTER ONE OF HER SURGERIES--SHE ATTRIBUTES TO LYING FLAT  . CHF (congestive heart failure)     CHRONIC SYSTOLIC HEART FAILURE-PER [facility] NOTE 07/07/11   . Left bundle branch block     CHRONIC PER [facility]ICE NOTES 07/07/11.  . Gout   . SVD (spontaneous vaginal delivery)     x 2  . Hyperlipidemia   . Anxiety   . Dry eyes, bilateral   . Pneumonia ~ 1999    "once"  . Exertional shortness of breath   . Diabetes mellitus     "diet controlled" (05/04/2103)  . Peripheral neuropathy     feet  . GERD (gastroesophageal reflux disease)     NEXIUM ONLY IF NEEDED; "haven't had any Nexium in 3-4 years" (05/03/2013)  . Headache(784.0)     "when I do have a headache it leads into a migraine" (05/03/2013)  . Migraines     "not all that often" (05/03/2013)  . On home oxygen therapy     "2L suppose to be q night" (05/03/2013)    Current Outpatient Prescriptions  Medication Sig Dispense Refill  . albuterol (PROVENTIL HFA;VENTOLIN HFA) 108 (90 BASE) MCG/ACT inhaler Inhale 2 puffs into the lungs every 6 (six) hours as needed. Wheezing       .  albuterol (PROVENTIL) (2.5 MG/3ML) 0.083% nebulizer solution Take 2.5 mg by nebulization every 6 (six) hours as needed. Wheezing       . ARIPiprazole (ABILIFY) 2 MG tablet Take 2 mg by mouth every morning.       Marland Kitchen buPROPion (WELLBUTRIN XL) 150 MG 24 hr tablet Take 300 mg by mouth every morning.       . carvedilol (COREG) 3.125 MG tablet Take 1 tablet in the am and 2 tablets in the pm  90 tablet  3  . clonazePAM (KLONOPIN) 0.5 MG tablet Take 0.5 mg by mouth 2 (two) times daily as needed. 1 in morning, 1 at bedtime      . digoxin (LANOXIN) 0.125 MG tablet Take 1 tablet (0.125 mg total) by mouth daily.  30 tablet  6  . docusate sodium 100 MG CAPS Take 100 mg by mouth 2 (two) times daily.  10 capsule    . febuxostat (ULORIC) 40 MG tablet Take 40 mg by mouth  at bedtime.       . ferrous sulfate 325 (65 FE) MG tablet Take 1 tablet (325 mg total) by mouth 3 (three) times daily after meals.      . Fluticasone-Salmeterol (ADVAIR) 250-50 MCG/DOSE AEPB Inhale 1 puff into the lungs every 12 (twelve) hours.       . gabapentin (NEURONTIN) 300 MG capsule Take 300 mg by mouth 2 (two) times daily. May take 3 times daily as needed      . ibuprofen (ADVIL,MOTRIN) 800 MG tablet Take 800 mg by mouth 3 (three) times daily as needed.      Marland Kitchen levothyroxine (SYNTHROID, LEVOTHROID) 125 MCG tablet Take 125 mcg by mouth every morning. On an empty stomach      . lisinopril (ZESTRIL) 2.5 MG tablet Take 1 tablet (2.5 mg total) by mouth at bedtime.  30 tablet  6  . methocarbamol (ROBAXIN) 500 MG tablet Take 1 tablet (500 mg total) by mouth every 6 (six) hours as needed (muscle spasms).  50 tablet  0  . polyethylene glycol (MIRALAX / GLYCOLAX) packet Take 17 g by mouth 2 (two) times daily.  14 each    . potassium chloride SA (K-DUR,KLOR-CON) 20 MEQ tablet Take 2 tabs in AM and 1 tab in PM  90 tablet  3  . simvastatin (ZOCOR) 10 MG tablet Take 10 mg by mouth every evening.       Marland Kitchen spironolactone (ALDACTONE) 25 MG tablet Take 1 tablet (25 mg total) by mouth daily.  30 tablet  3  . torsemide (DEMADEX) 20 MG tablet Take 2 tabs in AM and 1 tab in PM      . zolpidem (AMBIEN) 10 MG tablet Take 10 mg by mouth at bedtime as needed. For sleep       No current facility-administered medications for this encounter.     PHYSICAL EXAM: Filed Vitals:   05/10/13 1259  BP: 114/60  Pulse: 81  Height: 5\' 2"  (1.575 m)  Weight: 248 lb 12.8 oz (112.855 kg)  SpO2: 97%   General:  No resp difficulty; Husband present HEENT: normal except for cleft palate repair Neck: supple. JVP difficult to asses due to body habitus - appear ok; Carotids 2+ bilaterally; no bruits. No lymphadenopathy or thryomegaly appreciated. Cor: PMI nonpalpable. Regular rate & rhythm. Paradoxically split S2.  No rubs,  gallops or murmurs. L upper chest steri strips intact No erythema noted  Lungs: clear Abdomen: morbidly obese, soft, nontender, nondistended. No hepatosplenomegaly. No  bruits or masses. Good bowel sounds. Extremities: no cyanosis, clubbing, rash, no edema Neuro: alert & orientedx3, cranial nerves grossly intact. Moves all 4 extremities w/o difficulty.  ASSESSMENT & PLAN:  1) Chronic systolic HF: Nonischemic cardiomyopathy.  ?Viral myocarditis, cardiomyopathy dates from 1999. 11/14 echo EF 20-25% NYHA II-III. Functionally able to do a little more around the house without dyspnea. She is S/P BiVi 05/03/13 with pain associated at the incision site.     Volume status stable.  Continue torsemide 40 mg in am /20 mg pm and 25 mg spironolactone daily.  Continue digoxin 0.125 mg daily. Check dig level today.  - Continue carvedilol 3.125 mg bid and digoxin 0.125 mg daily. Check dig level today.  - Continue lisinopril 2.5 mg daily. Unable to up titrate meds due soft BP.  - Consider ordering CPX at next visit after she recovers from BiVi.  - Reinforced daily weights, low salt foods, and limiting fluid intake to < 2 liters per day.  2) Obesity - continue diet and exercise   Follow up in 8 weeks and consider ordering CPX at that time.   Rhonda Ellithorpe NP-C 1:21 PM

## 2013-05-10 NOTE — Addendum Note (Signed)
Encounter addended by: Noralee Space, RN on: 05/10/2013  1:44 PM<BR>     Documentation filed: Orders

## 2013-05-10 NOTE — Patient Instructions (Signed)
Follow up in 2 months  Do the following things EVERYDAY: 1) Weigh yourself in the morning before breakfast. Write it down and keep it in a log. 2) Take your medicines as prescribed 3) Eat low salt foods-Limit salt (sodium) to 2000 mg per day.  4) Stay as active as you can everyday 5) Limit all fluids for the day to less than 2 liters 

## 2013-05-11 ENCOUNTER — Telehealth: Payer: Self-pay | Admitting: Internal Medicine

## 2013-05-11 LAB — DIGOXIN LEVEL: Digoxin Level: 1.4 ng/mL (ref 0.8–2.0)

## 2013-05-11 NOTE — Telephone Encounter (Signed)
New Problem:   Pt states she recently had a device implanted and she wants to know how long she can keep wearing the sling... Also, the pt states she is taking 3 tablets of tylenol every 6 hours and she wants to speak to a nurse about her pain. Pt is requesting she have something called in.Marland KitchenMarland Kitchen

## 2013-05-11 NOTE — Telephone Encounter (Signed)
Spoke with patient and let her know that she should take the sling off 24 hours after being home and continue to alternate her Tylenol and Ibuprofen for the next few days

## 2013-05-15 ENCOUNTER — Ambulatory Visit (INDEPENDENT_AMBULATORY_CARE_PROVIDER_SITE_OTHER): Payer: Medicare Other | Admitting: *Deleted

## 2013-05-15 DIAGNOSIS — I5022 Chronic systolic (congestive) heart failure: Secondary | ICD-10-CM

## 2013-05-15 DIAGNOSIS — I428 Other cardiomyopathies: Secondary | ICD-10-CM | POA: Diagnosis not present

## 2013-05-15 LAB — MDC_IDC_ENUM_SESS_TYPE_INCLINIC
Battery Remaining Longevity: 82 mo
Battery Voltage: 3.12 V
Brady Statistic AP VP Percent: 0.09 %
Brady Statistic AP VS Percent: 0.02 %
Brady Statistic AS VP Percent: 99.78 %
Brady Statistic AS VS Percent: 0.11 %
Brady Statistic RA Percent Paced: 0.1 %
Brady Statistic RV Percent Paced: 99.83 %
Date Time Interrogation Session: 20141222111506
HighPow Impedance: 209 Ohm
HighPow Impedance: 64 Ohm
Lead Channel Impedance Value: 304 Ohm
Lead Channel Impedance Value: 399 Ohm
Lead Channel Impedance Value: 532 Ohm
Lead Channel Impedance Value: 627 Ohm
Lead Channel Impedance Value: 627 Ohm
Lead Channel Pacing Threshold Amplitude: 0.625 V
Lead Channel Pacing Threshold Amplitude: 0.625 V
Lead Channel Pacing Threshold Amplitude: 0.75 V
Lead Channel Pacing Threshold Pulse Width: 0.4 ms
Lead Channel Pacing Threshold Pulse Width: 0.4 ms
Lead Channel Pacing Threshold Pulse Width: 0.4 ms
Lead Channel Sensing Intrinsic Amplitude: 2.75 mV
Lead Channel Sensing Intrinsic Amplitude: 25.25 mV
Lead Channel Setting Pacing Amplitude: 2.75 V
Lead Channel Setting Pacing Amplitude: 3.5 V
Lead Channel Setting Pacing Amplitude: 3.5 V
Lead Channel Setting Pacing Pulse Width: 0.4 ms
Lead Channel Setting Pacing Pulse Width: 0.4 ms
Lead Channel Setting Sensing Sensitivity: 0.3 mV
Zone Setting Detection Interval: 300 ms
Zone Setting Detection Interval: 340 ms
Zone Setting Detection Interval: 350 ms
Zone Setting Detection Interval: 360 ms

## 2013-05-15 NOTE — Progress Notes (Signed)

## 2013-05-22 ENCOUNTER — Telehealth (HOSPITAL_COMMUNITY): Payer: Self-pay | Admitting: *Deleted

## 2013-05-22 DIAGNOSIS — I5022 Chronic systolic (congestive) heart failure: Secondary | ICD-10-CM

## 2013-05-22 MED ORDER — DIGOXIN 125 MCG PO TABS: 0.0625 mg | ORAL_TABLET | Freq: Every day | ORAL | Status: DC

## 2013-05-22 NOTE — Telephone Encounter (Signed)
Message copied by Noralee Space on Mon May 22, 2013  2:48 PM ------      Message from: Arvilla Meres R      Created: Thu May 18, 2013 10:50 PM       Decrease digoxin to 0.0625 daily. Recheck 2 weeks. ------

## 2013-06-06 ENCOUNTER — Telehealth: Payer: Self-pay | Admitting: *Deleted

## 2013-06-06 ENCOUNTER — Telehealth (HOSPITAL_COMMUNITY): Payer: Self-pay | Admitting: *Deleted

## 2013-06-06 DIAGNOSIS — I5022 Chronic systolic (congestive) heart failure: Secondary | ICD-10-CM

## 2013-06-06 NOTE — Telephone Encounter (Signed)
Message copied by Scarlette Calico on Tue Jun 06, 2013 10:48 AM ------      Message from: Glori Bickers R      Created: Thu May 18, 2013 10:50 PM       Decrease digoxin to 0.0625 daily. Recheck 2 weeks. ------

## 2013-06-06 NOTE — Telephone Encounter (Signed)
Pt has been sending transmissions everyday since implant. Updated pt she will not have to send reports;her home monitor is automatic/wireless.  Pt understands she will only have to manually transmit if we request her to.

## 2013-06-08 ENCOUNTER — Other Ambulatory Visit: Payer: Medicare Other

## 2013-06-14 ENCOUNTER — Other Ambulatory Visit: Payer: Medicare Other

## 2013-06-14 DIAGNOSIS — I5022 Chronic systolic (congestive) heart failure: Secondary | ICD-10-CM

## 2013-06-15 LAB — DIGOXIN LEVEL: Digoxin Level: 1 ng/mL (ref 0.8–2.0)

## 2013-07-04 ENCOUNTER — Encounter: Payer: Self-pay | Admitting: Internal Medicine

## 2013-07-13 ENCOUNTER — Ambulatory Visit: Payer: Medicare Other | Admitting: Psychiatry

## 2013-07-18 ENCOUNTER — Other Ambulatory Visit (HOSPITAL_COMMUNITY): Payer: Self-pay | Admitting: *Deleted

## 2013-07-18 MED ORDER — POTASSIUM CHLORIDE CRYS ER 20 MEQ PO TBCR: EXTENDED_RELEASE_TABLET | ORAL | Status: DC

## 2013-07-19 ENCOUNTER — Encounter: Payer: Self-pay | Admitting: *Deleted

## 2013-07-19 NOTE — Progress Notes (Signed)
This RN faxed an order for a diagnostic mammogram to [facility].

## 2013-07-26 DIAGNOSIS — Z0000 Encounter for general adult medical examination without abnormal findings: Secondary | ICD-10-CM | POA: Diagnosis not present

## 2013-07-26 DIAGNOSIS — C50919 Malignant neoplasm of unspecified site of unspecified female breast: Secondary | ICD-10-CM | POA: Diagnosis not present

## 2013-07-26 DIAGNOSIS — F329 Major depressive disorder, single episode, unspecified: Secondary | ICD-10-CM | POA: Diagnosis not present

## 2013-07-26 DIAGNOSIS — G609 Hereditary and idiopathic neuropathy, unspecified: Secondary | ICD-10-CM | POA: Diagnosis not present

## 2013-07-26 DIAGNOSIS — E559 Vitamin D deficiency, unspecified: Secondary | ICD-10-CM | POA: Diagnosis not present

## 2013-07-26 DIAGNOSIS — F3289 Other specified depressive episodes: Secondary | ICD-10-CM | POA: Diagnosis not present

## 2013-07-26 DIAGNOSIS — E1129 Type 2 diabetes mellitus with other diabetic kidney complication: Secondary | ICD-10-CM | POA: Diagnosis not present

## 2013-07-27 DIAGNOSIS — F0630 Mood disorder due to known physiological condition, unspecified: Secondary | ICD-10-CM | POA: Diagnosis not present

## 2013-08-03 DIAGNOSIS — Z853 Personal history of malignant neoplasm of breast: Secondary | ICD-10-CM | POA: Diagnosis not present

## 2013-08-03 DIAGNOSIS — Z803 Family history of malignant neoplasm of breast: Secondary | ICD-10-CM | POA: Diagnosis not present

## 2013-08-04 ENCOUNTER — Encounter: Payer: Self-pay | Admitting: Internal Medicine

## 2013-08-04 ENCOUNTER — Ambulatory Visit (INDEPENDENT_AMBULATORY_CARE_PROVIDER_SITE_OTHER): Payer: Medicare Other | Admitting: Internal Medicine

## 2013-08-04 VITALS — BP 129/80 | HR 78 | Ht 60.0 in | Wt 248.0 lb

## 2013-08-04 DIAGNOSIS — Z95810 Presence of automatic (implantable) cardiac defibrillator: Secondary | ICD-10-CM

## 2013-08-04 DIAGNOSIS — I519 Heart disease, unspecified: Secondary | ICD-10-CM

## 2013-08-04 DIAGNOSIS — I5022 Chronic systolic (congestive) heart failure: Secondary | ICD-10-CM

## 2013-08-04 HISTORY — DX: Presence of automatic (implantable) cardiac defibrillator: Z95.810

## 2013-08-04 LAB — MDC_IDC_ENUM_SESS_TYPE_INCLINIC
Battery Remaining Longevity: 89 mo
Battery Voltage: 3.02 V
Brady Statistic AP VP Percent: 0.09 %
Brady Statistic AP VS Percent: 0.01 %
Brady Statistic AS VP Percent: 99.82 %
Brady Statistic AS VS Percent: 0.08 %
Brady Statistic RA Percent Paced: 0.1 %
Brady Statistic RV Percent Paced: 99.24 %
Date Time Interrogation Session: 20150313124856
HighPow Impedance: 190 Ohm
HighPow Impedance: 64 Ohm
Lead Channel Impedance Value: 342 Ohm
Lead Channel Impedance Value: 342 Ohm
Lead Channel Impedance Value: 456 Ohm
Lead Channel Impedance Value: 589 Ohm
Lead Channel Impedance Value: 589 Ohm
Lead Channel Pacing Threshold Amplitude: 0.5 V
Lead Channel Pacing Threshold Amplitude: 0.5 V
Lead Channel Pacing Threshold Amplitude: 0.75 V
Lead Channel Pacing Threshold Pulse Width: 0.4 ms
Lead Channel Pacing Threshold Pulse Width: 0.4 ms
Lead Channel Pacing Threshold Pulse Width: 0.4 ms
Lead Channel Sensing Intrinsic Amplitude: 28.25 mV
Lead Channel Sensing Intrinsic Amplitude: 3.25 mV
Lead Channel Sensing Intrinsic Amplitude: 3.375 mV
Lead Channel Sensing Intrinsic Amplitude: 31.375 mV
Lead Channel Setting Pacing Amplitude: 2 V
Lead Channel Setting Pacing Amplitude: 2 V
Lead Channel Setting Pacing Amplitude: 2.5 V
Lead Channel Setting Pacing Pulse Width: 0.4 ms
Lead Channel Setting Pacing Pulse Width: 0.4 ms
Lead Channel Setting Sensing Sensitivity: 0.3 mV
Zone Setting Detection Interval: 300 ms
Zone Setting Detection Interval: 340 ms
Zone Setting Detection Interval: 350 ms
Zone Setting Detection Interval: 360 ms

## 2013-08-04 NOTE — Assessment & Plan Note (Signed)
Her heart failure symptoms remain class 2B but she continues to have dietary indiscretion. I have asked her to reduce the sodium in her diet and to take an extra metolazone.

## 2013-08-04 NOTE — Progress Notes (Signed)
HPI Mrs. Froemming returns today for followup. She is a pleasant 63 yo woman with a h/o chronic systolic heart failure, LBBB, s/p BiV ICD implant. In the interim she has done well except for severe dietary indiscretion. She has been out to eat at [employer] chinese buffet and [employer].  Allergies  Allergen Reactions  . Ceftin Anaphylaxis    Face and throat swell   . Shellfish Allergy Other (See Comments)    Gout exacerbation  . Allopurinol Nausea Only and Other (See Comments)    weakness  . Lorazepam Itching  . Sulfa Antibiotics Itching  . Ultram [Tramadol Hcl] Itching     Current Outpatient Prescriptions  Medication Sig Dispense Refill  . albuterol (PROVENTIL HFA;VENTOLIN HFA) 108 (90 BASE) MCG/ACT inhaler Inhale 2 puffs into the lungs every 6 (six) hours as needed. Wheezing       . albuterol (PROVENTIL) (2.5 MG/3ML) 0.083% nebulizer solution Take 2.5 mg by nebulization every 6 (six) hours as needed. Wheezing       . ARIPiprazole (ABILIFY) 2 MG tablet Take 2 mg by mouth every morning.       Marland Kitchen buPROPion (WELLBUTRIN XL) 150 MG 24 hr tablet Take 300 mg by mouth every morning.       . carvedilol (COREG) 3.125 MG tablet Take 1 tablet in the am and 2 tablets in the pm  90 tablet  3  . clonazePAM (KLONOPIN) 0.5 MG tablet Take 0.5 mg by mouth 2 (two) times daily as needed. 1 in morning, 1 at bedtime      . digoxin (LANOXIN) 0.125 MG tablet Take 0.5 tablets (0.0625 mg total) by mouth daily.  30 tablet  6  . docusate sodium 100 MG CAPS Take 100 mg by mouth 2 (two) times daily.  10 capsule    . febuxostat (ULORIC) 40 MG tablet Take 40 mg by mouth at bedtime.       . ferrous sulfate 325 (65 FE) MG tablet Take 1 tablet (325 mg total) by mouth 3 (three) times daily after meals.      . Fluticasone-Salmeterol (ADVAIR) 250-50 MCG/DOSE AEPB Inhale 1 puff into the lungs every 12 (twelve) hours.       . gabapentin (NEURONTIN) 300 MG capsule Take 300 mg by mouth 2 (two) times daily. May take  3 times daily as needed      . ibuprofen (ADVIL,MOTRIN) 800 MG tablet Take 800 mg by mouth 3 (three) times daily as needed.      Marland Kitchen levothyroxine (SYNTHROID, LEVOTHROID) 125 MCG tablet Take 125 mcg by mouth every morning. On an empty stomach      . lisinopril (ZESTRIL) 2.5 MG tablet Take 1 tablet (2.5 mg total) by mouth at bedtime.  30 tablet  6  . methocarbamol (ROBAXIN) 500 MG tablet Take 1 tablet (500 mg total) by mouth every 6 (six) hours as needed (muscle spasms).  50 tablet  0  . polyethylene glycol (MIRALAX / GLYCOLAX) packet Take 17 g by mouth 2 (two) times daily.  14 each    . potassium chloride SA (K-DUR,KLOR-CON) 20 MEQ tablet Take 2 tabs in AM and 1 tab in PM  90 tablet  3  . simvastatin (ZOCOR) 10 MG tablet Take 10 mg by mouth every evening.       Marland Kitchen spironolactone (ALDACTONE) 25 MG tablet Take 1 tablet (25 mg total) by mouth daily.  30 tablet  3  . torsemide (DEMADEX) 20 MG  tablet Take 2 tabs in AM and 1 tab in PM      . zolpidem (AMBIEN) 10 MG tablet Take 10 mg by mouth at bedtime as needed. For sleep       No current facility-administered medications for this visit.     Past Medical History  Diagnosis Date  . Unspecified vitamin D deficiency 03/26/2011    does not take meds  . Asthma   . Hypertension   . Heart murmur   . Cardiomyopathy     [name]  . Hypothyroidism   . Anemia   . Breast cancer, stage 1 03/26/2011    left-FINISHED CHEMO  AND RADIATION  . Arthritis     RIGHT KNEE ARTHRITIS AND PAIN-PT TOLD BONE ON BONE  . Depression   . Back pain     DISK PROBLEM  . Complication of anesthesia     PT STATES HER B/P LOW AFTER ONE OF HER SURGERIES--SHE ATTRIBUTES TO LYING FLAT  . CHF (congestive heart failure)     CHRONIC SYSTOLIC HEART FAILURE-[facility] NOTE 07/07/11   . Left bundle branch block     CHRONIC PER [facility]ICE NOTES 07/07/11.  . Gout   . SVD (spontaneous vaginal delivery)     x 2  .  Hyperlipidemia   . Anxiety   . Dry eyes, bilateral   . Pneumonia ~ 1999    "once"  . Exertional shortness of breath   . Diabetes mellitus     "diet controlled" (05/04/2103)  . Peripheral neuropathy     feet  . GERD (gastroesophageal reflux disease)     NEXIUM ONLY IF NEEDED; "haven't had any Nexium in 3-4 years" (05/03/2013)  . Headache(784.0)     "when I do have a headache it leads into a migraine" (05/03/2013)  . Migraines     "not all that often" (05/03/2013)  . On home oxygen therapy     "2L suppose to be q night" (05/03/2013)    ROS:   All systems reviewed and negative except as noted in the HPI.   Past Surgical History  Procedure Laterality Date  . Portacath placement  2012  . 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 Cardiac catheterization  2010    NORMAL CORONARY ARTERIES  . Port-a-cath removal  09/23/2011    Procedure: REMOVAL PORT-A-CATH;  Surgeon: Rolm Bookbinder, MD;  Location: [facility];  Service: General;  Laterality: N/A;  Port Removal  . Wisdom tooth extraction    . Colonoscopy    . Hysteroscopy w/d&c  01/20/2012    Procedure: DILATATION AND CURETTAGE /HYSTEROSCOPY;  Surgeon: Margarette Asal, MD;  Location: [facility];  Service: Gynecology;  Laterality: N/A;  with trueclear  . Total knee arthroplasty  05/10/2012    Procedure: TOTAL KNEE ARTHROPLASTY;  Surgeon: Mauri Pole, MD;  Location: [facility];  Service: Orthopedics;  Laterality: Right;  . Bi-ventricular implantable cardioverter defibrillator  (crt-d)  05/03/2013    MDT CRTD implanted by Dr Lovena Le for non ischemic cardiomyopathy  . Tonsillectomy  1960's  . Breast lumpectomy Left 07/28/2010     Family History  Problem Relation Age of Onset  . Birth defects Paternal Uncle      History   Social History  . Marital Status: Married    Spouse Name: N/A    Number of Children: N/A  .  Years of Education: N/A   Occupational History    . Not on file.   Social History Main Topics  . Smoking status: Former Smoker -- 0.10 packs/day for 26 years    Types: Cigarettes    Quit date: 05/06/2007  . Smokeless tobacco: Never Used  . Alcohol Use: No  . Drug Use: No  . Sexual Activity: Yes   Other Topics Concern  . Not on file   Social History Narrative  . No narrative on file     BP 129/80  Pulse 78  Ht 5' (1.524 m)  Wt 248 lb (112.492 kg)  BMI 48.43 kg/m2  Physical Exam:  Well appearing but obese 63 yo woman, NAD HEENT: Unremarkable Neck:  No JVD, no thyromegally Back:  No CVA tenderness Lungs:  Clear with no wheezes HEART:  Regular rate rhythm, no murmurs, no rubs, no clicks Abd:  soft, positive bowel sounds, no organomegally, no rebound, no guarding Ext:  2 plus pulses, no edema, no cyanosis, no clubbing Skin:  No rashes no nodules Neuro:  CN II through XII intact, motor grossly intact  EKG - NSR with BiV pacing  DEVICE  Normal device function.  See PaceArt for details.   Assess/Plan:

## 2013-08-04 NOTE — Assessment & Plan Note (Signed)
Her device is working normally. Will recheck in several months. Optivol is elevated.

## 2013-08-04 NOTE — Patient Instructions (Signed)
Your physician recommends that you continue on your current medications as directed. Please refer to the Current Medication list given to you today.  Remote monitoring is used to monitor your Pacemaker of ICD from home. This monitoring reduces the number of office visits required to check your device to one time per year. It allows [place] to keep an eye on the functioning of your device to ensure it is working properly. You are scheduled for a device check from home on 11/06/13. You may send your transmission at any time that day. If you have a wireless device, the transmission will be sent automatically. After your physician reviews your transmission, you will receive a postcard with your next transmission date.  Your physician wants you to follow-up in: 9 months with Dr. Lovena Le.  You will receive a reminder letter in the mail two months in advance. If you don't receive a letter, please call our office to schedule the follow-up appointment.

## 2013-08-10 ENCOUNTER — Ambulatory Visit (HOSPITAL_COMMUNITY): Admission: RE | Admit: 2013-08-10 | Payer: Medicare Other | Source: Ambulatory Visit

## 2013-08-24 ENCOUNTER — Other Ambulatory Visit (HOSPITAL_COMMUNITY): Payer: Self-pay | Admitting: Vascular Surgery

## 2013-08-24 ENCOUNTER — Encounter (HOSPITAL_COMMUNITY): Payer: Medicare Other

## 2013-09-06 DIAGNOSIS — M549 Dorsalgia, unspecified: Secondary | ICD-10-CM | POA: Diagnosis not present

## 2013-09-06 DIAGNOSIS — M5380 Other specified dorsopathies, site unspecified: Secondary | ICD-10-CM | POA: Diagnosis not present

## 2013-09-06 DIAGNOSIS — R3989 Other symptoms and signs involving the genitourinary system: Secondary | ICD-10-CM | POA: Diagnosis not present

## 2013-09-06 DIAGNOSIS — R109 Unspecified abdominal pain: Secondary | ICD-10-CM | POA: Diagnosis not present

## 2013-09-07 ENCOUNTER — Other Ambulatory Visit: Payer: Self-pay | Admitting: Family Medicine

## 2013-09-07 DIAGNOSIS — R109 Unspecified abdominal pain: Secondary | ICD-10-CM

## 2013-09-14 ENCOUNTER — Other Ambulatory Visit: Payer: Medicare Other

## 2013-09-14 ENCOUNTER — Ambulatory Visit (HOSPITAL_COMMUNITY)
Admission: RE | Admit: 2013-09-14 | Discharge: 2013-09-14 | Disposition: A | Discharge status: Home / Self Care | Payer: Medicare Other | Source: Ambulatory Visit | Attending: Internal Medicine | Admitting: Internal Medicine

## 2013-09-14 VITALS — BP 114/64 | HR 87 | Wt 246.8 lb

## 2013-09-14 DIAGNOSIS — J9610 Chronic respiratory failure, unspecified whether with hypoxia or hypercapnia: Secondary | ICD-10-CM

## 2013-09-14 DIAGNOSIS — I10 Essential (primary) hypertension: Secondary | ICD-10-CM

## 2013-09-14 DIAGNOSIS — I5022 Chronic systolic (congestive) heart failure: Secondary | ICD-10-CM

## 2013-09-14 HISTORY — DX: Chronic respiratory failure, unspecified whether with hypoxia or hypercapnia: J96.10

## 2013-09-14 LAB — BASIC METABOLIC PANEL
BUN: 56 mg/dL — ABNORMAL HIGH (ref 6–23)
CO2: 29 mEq/L (ref 19–32)
Calcium: 9.2 mg/dL (ref 8.4–10.5)
Chloride: 95 mEq/L — ABNORMAL LOW (ref 96–112)
Creatinine, Ser: 1.43 mg/dL — ABNORMAL HIGH (ref 0.50–1.10)
GFR calc Af Amer: 45 mL/min — ABNORMAL LOW (ref >90)
GFR calc non Af Amer: 39 mL/min — ABNORMAL LOW (ref >90)
Glucose, Bld: 110 mg/dL — ABNORMAL HIGH (ref 70–99)
Potassium: 3.8 mEq/L (ref 3.7–5.3)
Sodium: 138 mEq/L (ref 137–147)

## 2013-09-14 LAB — DIGOXIN LEVEL: Digoxin Level: 0.5 ng/mL — ABNORMAL LOW (ref 0.8–2.0)

## 2013-09-14 MED ORDER — SPIRONOLACTONE 25 MG PO TABS: 25.0000 mg | ORAL_TABLET | Freq: Every day | ORAL | Status: DC

## 2013-09-14 MED ORDER — CARVEDILOL 6.25 MG PO TABS: ORAL_TABLET | ORAL | Status: DC

## 2013-09-14 NOTE — Patient Instructions (Addendum)
Increase your coreg to 6.25 mg (2 tablets) in the morning and 6.25 mg (2 tablets) in the evening. You currently have a 3.125 mg tablet, when you run out your new prescription will be a 6.25 mg tablet and then you will take 1 tablet in the morning and 1 tablet in the evening.   Take an extra 20 mg (1 tablet) of torsemide today and tomorrow.   Increase your spironolactone back to 25 mg (1 tablet) daily  Follow up in 1-2 weeks with labs and ECHO.  Follow up in 2 months.  Do the following things EVERYDAY: 1) Weigh yourself in the morning before breakfast. Write it down and keep it in a log. 2) Take your medicines as prescribed 3) Eat low salt foods-Limit salt (sodium) to 2000 mg per day.  4) Stay as active as you can everyday 5) Limit all fluids for the day to less than 2 liters 6)

## 2013-09-14 NOTE — Progress Notes (Signed)
Patient ID: [name], female   DOB: [birth year], 63 y.o.   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 She has had sleep study one year ago- no evidence of sleep apnea.   10/14/12 ECHO EF 35% RV ok  04/12/13 ECHO EF 20-25%, LV moderately dilated  Follow up: Doing well. Is now taking digoxin 0.0625 mg daily because of elevated digoxin level. Overall doing well. Denies SOB, orthopnea, PND, or CP. Not wearing her oxygen at night at more. Weight at home 242-246 lbs. Compliant with medications. Trying to follow a low salt diet and drinking less than 2L a day. Eating a lot of pickles. Able to walk about 1/2 mile before SOB. Able to go up stairs.    11/24/12 Creatinine 1.23 Potassium 4.1 12/15/12 Creatinine 2.03 Potassium 3.3  12/22/12 Creatinine 1.2 Potassium 3.8 02/23/13 Creatinine 1.68 K 4.4  05/02/13 K 4.2 Creatinine 1.8  ROS: All systems negative ex cept as listed in HPI, PMH and Problem List.  Past Medical History  Diagnosis Date  . Unspecified vitamin D deficiency 03/26/2011    does not take meds  . Asthma   . Hypertension   . Heart murmur   . Cardiomyopathy     PT'S CARDIOLOGIST IS DR. Tressia Miners [name]  . Hypothyroidism   . Anemia   . Breast cancer, stage 1 03/26/2011    left-FINISHED CHEMO  AND RADIATION  . Arthritis     RIGHT KNEE ARTHRITIS AND PAIN-PT TOLD BONE ON BONE  . Depression   . Back pain     DISK PROBLEM  . Complication of anesthesia     PT STATES HER B/P LOW AFTER  ONE OF HER SURGERIES--SHE ATTRIBUTES TO LYING FLAT  . CHF (congestive heart failure)     CHRONIC SYSTOLIC HEART FAILURE-PER [facility] OFFICE NOTE 07/07/11   . Left bundle branch block     CHRONIC PER [facility] OFFICE NOTES 07/07/11.  . Gout   . SVD (spontaneous vaginal delivery)     x 2  . Hyperlipidemia   . Anxiety   . Dry eyes, bilateral   . Pneumonia ~ 1999    "once"  . Exertional shortness of breath   . Diabetes mellitus     "diet controlled" (05/04/2103)  . Peripheral neuropathy     feet  . GERD (gastroesophageal reflux disease)     NEXIUM ONLY IF NEEDED; "haven't had any Nexium in 3-4 years" (05/03/2013)  . Headache(784.0)     "when I do have a headache it leads into a migraine" (05/03/2013)  . Migraines     "not all that often" (05/03/2013)  . On home oxygen therapy     "2L suppose to be q night" (05/03/2013)    Current Outpatient Prescriptions  Medication Sig Dispense Refill  . albuterol (PROVENTIL HFA;VENTOLIN HFA) 108 (90 BASE) MCG/ACT inhaler Inhale 2 puffs into the lungs every 6 (six) hours as needed. Wheezing       . albuterol (PROVENTIL) (2.5 MG/3ML)  0.083% nebulizer solution Take 2.5 mg by nebulization every 6 (six) hours as needed. Wheezing       . ARIPiprazole (ABILIFY) 2 MG tablet Take 2 mg by mouth every morning.       Marland Kitchen buPROPion (WELLBUTRIN XL) 150 MG 24 hr tablet Take 300 mg by mouth every morning.       . carvedilol (COREG) 3.125 MG tablet Take 1 tablet in the am and 2 tablets in the pm  90 tablet  3  . clonazePAM (KLONOPIN) 0.5 MG tablet Take 0.5 mg by mouth 2 (two) times daily as needed. 1 in morning, 1 at bedtime      . digoxin (LANOXIN) 0.125 MG tablet Take 0.5 tablets (0.0625 mg total) by mouth daily.  30 tablet  6  . febuxostat (ULORIC) 40 MG tablet Take 40 mg by mouth at bedtime.       . ferrous sulfate 325 (65 FE) MG tablet Take 1 tablet (325 mg total) by mouth 3 (three) times daily after meals.      . Fluticasone-Salmeterol (ADVAIR)  250-50 MCG/DOSE AEPB Inhale 1 puff into the lungs every 12 (twelve) hours.       . gabapentin (NEURONTIN) 300 MG capsule Take 300 mg by mouth 2 (two) times daily. May take 3 times daily as needed      . levothyroxine (SYNTHROID, LEVOTHROID) 125 MCG tablet Take 125 mcg by mouth every morning. On an empty stomach      . lisinopril (ZESTRIL) 2.5 MG tablet Take 1 tablet (2.5 mg total) by mouth at bedtime.  30 tablet  6  . polyethylene glycol (MIRALAX / GLYCOLAX) packet Take 17 g by mouth 2 (two) times daily.  14 each    . potassium chloride SA (K-DUR,KLOR-CON) 20 MEQ tablet Take 2 tabs in AM and 1 tab in PM  90 tablet  3  . simvastatin (ZOCOR) 10 MG tablet Take 10 mg by mouth every evening.       Marland Kitchen spironolactone (ALDACTONE) 25 MG tablet Take 12.5 mg by mouth daily.      Marland Kitchen torsemide (DEMADEX) 20 MG tablet Take 2 tabs in AM and 1 tab in PM      . zolpidem (AMBIEN) 10 MG tablet Take 10 mg by mouth at bedtime as needed. For sleep      . methocarbamol (ROBAXIN) 500 MG tablet Take 1 tablet (500 mg total) by mouth every 6 (six) hours as needed (muscle spasms).  50 tablet  0   No current facility-administered medications for this encounter.      Filed Vitals:   09/14/13 1333  BP: 114/64  Pulse: 87  Weight: 246 lb 12.8 oz (111.948 kg)  SpO2: 99%    PHYSICAL EXAM: General:  No resp difficulty; Husband present HEENT: normal except for cleft palate repair Neck: supple. JVP difficult to asses due to body habitus but appears mildly elevated; carotids 2+ bilaterally; no bruits. No lymphadenopathy or thryomegaly appreciated. Cor: PMI nonpalpable. Regular rate & rhythm. Paradoxically split S2.  No rubs, gallops or murmurs.Lungs: clear Abdomen: morbidly obese, soft, nontender, nondistended. No hepatosplenomegaly. No bruits or masses. Good bowel sounds. Extremities: no cyanosis, clubbing, rash, trace edema Neuro: alert & orientedx3, cranial nerves grossly intact. Moves all 4 extremities w/o difficulty.     ASSESSMENT & PLAN:  1) Chronic systolic HF: NICM; ?Viral myocarditis, cardiomyopathy dates from 1999. EF 20-25% (03/2013) - NYHA II symptoms and volume status appears slightly elevated. Functional capacity has increased since CRT-D. Will  have her take an extra 20 mg of torsemide for 2 days. Check BMET today. - Increase coreg to 6.25 mg BID. - Continue lisinopril 2.5 mg daily. Will increase Spiro back 25 mg daily and check BMET in 2 weeks. - Continue digoxin 0.0625 mg daily, check dig level.  - Repeat ECHO 2 weeks. - Reinforced the need and importance of daily weights, a low sodium diet, and fluid restriction (less than 2 L a day). Instructed to call the HF clinic if weight increases more than 3 lbs overnight or 5 lbs in a week.  2) Obesity - Discussed trying to be more active and to cut back on her portion sizes. 3) HTN - Stable. As above will increase coreg and spironolactone.  4) Chronic respiratory failure - Was wearing 3L O2 at night, but stopped because she thinks she is doing well and doesn't need. Will continue to monitor.   F/U 2 months, ECHO 7 days with BMET Rhonda Brunt NP-C 2:00 PM

## 2013-09-21 ENCOUNTER — Ambulatory Visit
Admission: RE | Admit: 2013-09-21 | Discharge: 2013-09-21 | Disposition: A | Discharge status: Home / Self Care | Payer: Medicare Other | Source: Ambulatory Visit | Attending: Family Medicine | Admitting: Family Medicine

## 2013-09-21 ENCOUNTER — Encounter (INDEPENDENT_AMBULATORY_CARE_PROVIDER_SITE_OTHER): Payer: Self-pay

## 2013-09-21 ENCOUNTER — Other Ambulatory Visit: Payer: Self-pay | Admitting: Family Medicine

## 2013-09-21 DIAGNOSIS — M545 Low back pain, unspecified: Secondary | ICD-10-CM | POA: Diagnosis not present

## 2013-09-21 DIAGNOSIS — M549 Dorsalgia, unspecified: Secondary | ICD-10-CM

## 2013-09-21 DIAGNOSIS — K8020 Calculus of gallbladder without cholecystitis without obstruction: Secondary | ICD-10-CM | POA: Diagnosis not present

## 2013-09-21 DIAGNOSIS — R109 Unspecified abdominal pain: Secondary | ICD-10-CM

## 2013-09-27 ENCOUNTER — Other Ambulatory Visit (HOSPITAL_COMMUNITY): Payer: Medicare Other

## 2013-09-27 ENCOUNTER — Ambulatory Visit (HOSPITAL_COMMUNITY): Payer: Medicare Other

## 2013-09-28 ENCOUNTER — Ambulatory Visit: Payer: Medicare Other

## 2013-10-09 ENCOUNTER — Encounter: Payer: Self-pay | Admitting: Internal Medicine

## 2013-10-11 ENCOUNTER — Ambulatory Visit (HOSPITAL_COMMUNITY)
Admission: RE | Admit: 2013-10-11 | Discharge: 2013-10-11 | Disposition: A | Discharge status: Home / Self Care | Payer: Medicare Other | Source: Ambulatory Visit | Attending: Family Medicine | Admitting: Family Medicine

## 2013-10-11 ENCOUNTER — Ambulatory Visit (HOSPITAL_BASED_OUTPATIENT_CLINIC_OR_DEPARTMENT_OTHER)
Admission: RE | Admit: 2013-10-11 | Discharge: 2013-10-11 | Disposition: A | Discharge status: Home / Self Care | Payer: Medicare Other | Source: Ambulatory Visit | Attending: Internal Medicine | Admitting: Internal Medicine

## 2013-10-11 DIAGNOSIS — I5022 Chronic systolic (congestive) heart failure: Secondary | ICD-10-CM

## 2013-10-11 DIAGNOSIS — I519 Heart disease, unspecified: Secondary | ICD-10-CM

## 2013-10-11 DIAGNOSIS — I509 Heart failure, unspecified: Secondary | ICD-10-CM | POA: Insufficient documentation

## 2013-10-11 LAB — BASIC METABOLIC PANEL
BUN: 34 mg/dL — ABNORMAL HIGH (ref 6–23)
CO2: 31 mEq/L (ref 19–32)
Calcium: 9.2 mg/dL (ref 8.4–10.5)
Chloride: 96 mEq/L (ref 96–112)
Creatinine, Ser: 1.69 mg/dL — ABNORMAL HIGH (ref 0.50–1.10)
GFR calc Af Amer: 37 mL/min — ABNORMAL LOW (ref >90)
GFR calc non Af Amer: 32 mL/min — ABNORMAL LOW (ref >90)
Glucose, Bld: 118 mg/dL — ABNORMAL HIGH (ref 70–99)
Potassium: 3.7 mEq/L (ref 3.7–5.3)
Sodium: 140 mEq/L (ref 137–147)

## 2013-10-11 NOTE — Progress Notes (Signed)
  Echocardiogram 2D Echocardiogram has been performed.  Rhonda [name] 10/11/2013, 3:29 PM

## 2013-10-12 ENCOUNTER — Telehealth (HOSPITAL_COMMUNITY): Payer: Self-pay | Admitting: Anesthesiology

## 2013-10-12 NOTE — Telephone Encounter (Signed)
Patients EF improved from 20-25% to 45-50%. Discussed with patient and instructed to continue to take medications as prescribed.   Rande Brunt 12:33 PM

## 2013-11-06 ENCOUNTER — Ambulatory Visit (INDEPENDENT_AMBULATORY_CARE_PROVIDER_SITE_OTHER): Payer: Medicare Other | Admitting: *Deleted

## 2013-11-06 ENCOUNTER — Other Ambulatory Visit (HOSPITAL_COMMUNITY): Payer: Self-pay | Admitting: *Deleted

## 2013-11-06 DIAGNOSIS — Z95810 Presence of automatic (implantable) cardiac defibrillator: Secondary | ICD-10-CM | POA: Diagnosis not present

## 2013-11-06 DIAGNOSIS — I428 Other cardiomyopathies: Secondary | ICD-10-CM

## 2013-11-06 DIAGNOSIS — I5022 Chronic systolic (congestive) heart failure: Secondary | ICD-10-CM

## 2013-11-06 LAB — MDC_IDC_ENUM_SESS_TYPE_REMOTE
Battery Remaining Longevity: 86 mo
Brady Statistic AP VP Percent: 0.1 %
Brady Statistic AP VS Percent: 0.1 %
Brady Statistic AS VP Percent: 99.9 %
Brady Statistic AS VS Percent: 0.1 %
HighPow Impedance: 69 Ohm
Lead Channel Impedance Value: 380 Ohm
Lead Channel Impedance Value: 456 Ohm
Lead Channel Impedance Value: 589 Ohm
Lead Channel Pacing Threshold Amplitude: 0.5 V
Lead Channel Pacing Threshold Amplitude: 0.625 V
Lead Channel Pacing Threshold Amplitude: 1.125 V
Lead Channel Pacing Threshold Pulse Width: 0.4 ms
Lead Channel Pacing Threshold Pulse Width: 0.4 ms
Lead Channel Pacing Threshold Pulse Width: 0.4 ms
Lead Channel Sensing Intrinsic Amplitude: 20 mV
Lead Channel Sensing Intrinsic Amplitude: 4.3 mV
Lead Channel Setting Pacing Amplitude: 2 V
Lead Channel Setting Pacing Amplitude: 2.25 V
Lead Channel Setting Pacing Amplitude: 2.5 V
Lead Channel Setting Pacing Pulse Width: 0.4 ms
Lead Channel Setting Pacing Pulse Width: 0.4 ms
Lead Channel Setting Sensing Sensitivity: 0.3 mV
Zone Setting Detection Interval: 300 ms
Zone Setting Detection Interval: 340 ms
Zone Setting Detection Interval: 350 ms
Zone Setting Detection Interval: 360 ms

## 2013-11-06 MED ORDER — DIGOXIN 125 MCG PO TABS: 0.0625 mg | ORAL_TABLET | Freq: Every day | ORAL | Status: DC

## 2013-11-07 NOTE — Progress Notes (Signed)
Remote ICD transmission.   

## 2013-11-10 ENCOUNTER — Other Ambulatory Visit (HOSPITAL_COMMUNITY): Payer: Self-pay

## 2013-11-13 ENCOUNTER — Other Ambulatory Visit (HOSPITAL_COMMUNITY): Payer: Self-pay

## 2013-11-13 MED ORDER — LISINOPRIL 2.5 MG PO TABS: 2.5000 mg | ORAL_TABLET | Freq: Every day | ORAL | Status: DC

## 2013-11-14 ENCOUNTER — Encounter: Payer: Self-pay | Admitting: Cardiology

## 2013-11-16 ENCOUNTER — Encounter: Payer: Self-pay | Admitting: Internal Medicine

## 2013-11-16 ENCOUNTER — Ambulatory Visit (HOSPITAL_COMMUNITY)
Admission: RE | Admit: 2013-11-16 | Discharge: 2013-11-16 | Disposition: A | Discharge status: Home / Self Care | Payer: Medicare Other | Source: Ambulatory Visit | Attending: Internal Medicine | Admitting: Internal Medicine

## 2013-11-16 ENCOUNTER — Encounter (INDEPENDENT_AMBULATORY_CARE_PROVIDER_SITE_OTHER): Payer: Self-pay

## 2013-11-16 ENCOUNTER — Other Ambulatory Visit (HOSPITAL_COMMUNITY): Payer: Self-pay | Admitting: *Deleted

## 2013-11-16 VITALS — BP 101/61 | HR 71 | Resp 18 | Wt 243.1 lb

## 2013-11-16 DIAGNOSIS — E785 Hyperlipidemia, unspecified: Secondary | ICD-10-CM | POA: Diagnosis not present

## 2013-11-16 DIAGNOSIS — I5022 Chronic systolic (congestive) heart failure: Secondary | ICD-10-CM | POA: Diagnosis not present

## 2013-11-16 DIAGNOSIS — J45909 Unspecified asthma, uncomplicated: Secondary | ICD-10-CM | POA: Diagnosis not present

## 2013-11-16 DIAGNOSIS — E039 Hypothyroidism, unspecified: Secondary | ICD-10-CM | POA: Insufficient documentation

## 2013-11-16 DIAGNOSIS — Z853 Personal history of malignant neoplasm of breast: Secondary | ICD-10-CM | POA: Insufficient documentation

## 2013-11-16 DIAGNOSIS — I428 Other cardiomyopathies: Secondary | ICD-10-CM | POA: Diagnosis not present

## 2013-11-16 DIAGNOSIS — K219 Gastro-esophageal reflux disease without esophagitis: Secondary | ICD-10-CM | POA: Diagnosis not present

## 2013-11-16 DIAGNOSIS — I10 Essential (primary) hypertension: Secondary | ICD-10-CM | POA: Diagnosis not present

## 2013-11-16 DIAGNOSIS — Z9981 Dependence on supplemental oxygen: Secondary | ICD-10-CM | POA: Diagnosis not present

## 2013-11-16 MED ORDER — POTASSIUM CHLORIDE CRYS ER 20 MEQ PO TBCR: EXTENDED_RELEASE_TABLET | ORAL | Status: DC

## 2013-11-16 NOTE — Progress Notes (Signed)
Patient ID: Rhonda [name], female   DOB: [birth year], 63 y.o.   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 She has had sleep study one year ago- no evidence of sleep apnea.   She returns for follow up. Last visit carvedilol was increased to 6.25 mg twice a day and spironolactone was increased to 25 mg daily. Overall she is doing great. Mild SOB with exertion in the heat. Weight at home 238 pounds.  Taking all medications. Walking twice a day about 1/2 a mile. Tries to follow low salt diet. Drinking < 2 liters of fluid per day.   10/14/12 ECHO EF 35% RV ok  04/12/13 ECHO EF 20-25%, LV moderately dilated 10/11/2013 ECHO EF 45-50%  11/24/12 Creatinine 1.23 Potassium 4.1 12/15/12 Creatinine 2.03 Potassium 3.3  12/22/12 Creatinine 1.2 Potassium 3.8 02/23/13 Creatinine 1.68 K 4.4  05/02/13 K 4.2 Creatinine 1.8  10/11/13 K 3.7 Creatinine 1.69  ROS: All systems negative ex cept as listed in HPI, PMH and Problem List.  Past Medical History  Diagnosis Date  . Unspecified vitamin D deficiency 03/26/2011    does not take meds  . Asthma   . Hypertension   . Heart murmur   . Cardiomyopathy     [name]  . Hypothyroidism   . Anemia   . Breast cancer, stage 1 03/26/2011    left-FINISHED CHEMO  AND RADIATION  . Arthritis     RIGHT KNEE ARTHRITIS AND PAIN-PT TOLD BONE ON BONE  . Depression   . Back pain     DISK PROBLEM  . Complication of anesthesia     PT STATES HER  B/P LOW AFTER ONE OF HER SURGERIES--SHE ATTRIBUTES TO LYING FLAT  . CHF (congestive heart failure)     CHRONIC SYSTOLIC HEART FAILURE-[facility] NOTE 07/07/11   . Left bundle branch block     CHRONIC PER [facility]ICE NOTES 07/07/11.  . Gout   . SVD (spontaneous vaginal delivery)     x 2  . Hyperlipidemia   . Anxiety   . Dry eyes, bilateral   . Pneumonia ~ 1999    "once"  . Exertional shortness of breath   . Diabetes mellitus     "diet controlled" (05/04/2103)  . Peripheral neuropathy     feet  . GERD (gastroesophageal reflux disease)     NEXIUM ONLY IF NEEDED; "haven't had any Nexium in 3-4 years" (05/03/2013)  . Headache(784.0)     "when I do have a headache it leads into a migraine" (05/03/2013)  . Migraines     "not all that often" (05/03/2013)  . On home oxygen therapy     "2L suppose to be q night" (05/03/2013)    Current Outpatient Prescriptions  Medication Sig Dispense Refill  . albuterol (PROVENTIL HFA;VENTOLIN HFA) 108 (90 BASE) MCG/ACT inhaler Inhale 2 puffs into the lungs every 6 (six) hours as needed. Wheezing       . albuterol (PROVENTIL) (  2.5 MG/3ML) 0.083% nebulizer solution Take 2.5 mg by nebulization every 6 (six) hours as needed. Wheezing       . ARIPiprazole (ABILIFY) 2 MG tablet Take 2 mg by mouth every morning.       Marland Kitchen buPROPion (WELLBUTRIN XL) 150 MG 24 hr tablet Take 300 mg by mouth every morning.       . carvedilol (COREG) 6.25 MG tablet Take 1 tablet in the am and 2 tablets in the pm  60 tablet  3  . clonazePAM (KLONOPIN) 0.5 MG tablet Take 0.5 mg by mouth 2 (two) times daily as needed. 1 in morning, 1 at bedtime      . digoxin (LANOXIN) 0.125 MG tablet Take 0.5 tablets (0.0625 mg total) by mouth daily.  15 tablet  6  . febuxostat (ULORIC) 40 MG tablet Take 40 mg by mouth at bedtime.       . ferrous sulfate 325 (65 FE) MG tablet Take 325 mg by mouth 4 (four) times daily.      . Fluticasone-Salmeterol (ADVAIR) 250-50 MCG/DOSE  AEPB Inhale 1 puff into the lungs every 12 (twelve) hours.       . gabapentin (NEURONTIN) 300 MG capsule Take 300 mg by mouth 2 (two) times daily. May take 3 times daily as needed      . levothyroxine (SYNTHROID, LEVOTHROID) 125 MCG tablet Take 125 mcg by mouth every morning. On an empty stomach      . lisinopril (ZESTRIL) 2.5 MG tablet Take 1 tablet (2.5 mg total) by mouth at bedtime.  30 tablet  6  . methocarbamol (ROBAXIN) 500 MG tablet Take 1 tablet (500 mg total) by mouth every 6 (six) hours as needed (muscle spasms).  50 tablet  0  . polyethylene glycol (MIRALAX / GLYCOLAX) packet Take 17 g by mouth 2 (two) times daily.  14 each    . potassium chloride SA (K-DUR,KLOR-CON) 20 MEQ tablet Take 2 tabs in AM and 1 tab in PM  90 tablet  6  . simvastatin (ZOCOR) 10 MG tablet Take 10 mg by mouth every evening.       Marland Kitchen spironolactone (ALDACTONE) 25 MG tablet Take 1 tablet (25 mg total) by mouth daily.  30 tablet  3  . torsemide (DEMADEX) 20 MG tablet Take 2 tabs in AM and 1 tab in PM      . zolpidem (AMBIEN) 10 MG tablet Take 10 mg by mouth at bedtime as needed. For sleep       No current facility-administered medications for this encounter.      Filed Vitals:   11/16/13 1409  BP: 101/61  Pulse: 71  Resp: 18  Weight: 243 lb 2 oz (110.281 kg)  SpO2: 99%    PHYSICAL EXAM: General:  No resp difficulty; Husband present HEENT: normal except for cleft palate repair Neck: supple. JVP difficult to asses due to body habitus but appears mildly elevated; carotids 2+ bilaterally; no bruits. No lymphadenopathy or thryomegaly appreciated. Cor: PMI nonpalpable. Regular rate & rhythm. Paradoxically split S2.  No rubs, gallops or murmurs.Lungs: clear Abdomen: morbidly obese, soft, nontender, nondistended. No hepatosplenomegaly. No bruits or masses. Good bowel sounds. Extremities: no cyanosis, clubbing, rash, edema Neuro: alert & orientedx3, cranial nerves grossly intact. Moves all 4 extremities w/o  difficulty.    ASSESSMENT & PLAN:  1) Chronic systolic HF: NICM; ?Viral myocarditis, cardiomyopathy dates from 1999. EF 20-25% (03/2013) improved to 45-50% ECHO in May 2015.  - NYHA II symptoms and volume  status appears slightly elevated. Functional capacity has increased since CRT-D.  Optivol - Fluid well below threshold. Activity ~2 hours per day.  Volume status stable. Continue torsemide 40 mg in am and 20 mg in pm. Also 25 mg spironolactone daily.  Continue coreg to 6.25 mg BID. Continue digoxin 0.0625 mg daily.  - Reinforced the need and importance of daily weights, a low sodium diet, and fluid restriction (less than 2 L a day). Instructed to call the HF clinic if weight increases more than 3 lbs overnight or 5 lbs in a week.  2) Obesity - Discussed trying to be more active and to cut back on her portion sizes. 3) HTN - Stable and on the low side.  Continue current medications.     Follow up in 3 months check BMET and Optivol at that time.  Kelvin Burpee NP-C 2:15 PM

## 2013-11-16 NOTE — Patient Instructions (Signed)
Follow up in 3 months   Do the following things EVERYDAY: 1) Weigh yourself in the morning before breakfast. Write it down and keep it in a log. 2) Take your medicines as prescribed 3) Eat low salt foods-Limit salt (sodium) to 2000 mg per day.  4) Stay as active as you can everyday 5) Limit all fluids for the day to less than 2 liters  

## 2013-12-18 ENCOUNTER — Encounter: Payer: Self-pay | Admitting: *Deleted

## 2014-01-04 DIAGNOSIS — R5383 Other fatigue: Secondary | ICD-10-CM | POA: Diagnosis not present

## 2014-01-04 DIAGNOSIS — E119 Type 2 diabetes mellitus without complications: Secondary | ICD-10-CM | POA: Diagnosis not present

## 2014-01-04 DIAGNOSIS — E039 Hypothyroidism, unspecified: Secondary | ICD-10-CM | POA: Diagnosis not present

## 2014-01-04 DIAGNOSIS — R5381 Other malaise: Secondary | ICD-10-CM | POA: Diagnosis not present

## 2014-01-04 DIAGNOSIS — E559 Vitamin D deficiency, unspecified: Secondary | ICD-10-CM | POA: Diagnosis not present

## 2014-01-04 DIAGNOSIS — D7589 Other specified diseases of blood and blood-forming organs: Secondary | ICD-10-CM | POA: Diagnosis not present

## 2014-01-04 DIAGNOSIS — D649 Anemia, unspecified: Secondary | ICD-10-CM | POA: Diagnosis not present

## 2014-01-04 DIAGNOSIS — I5022 Chronic systolic (congestive) heart failure: Secondary | ICD-10-CM | POA: Diagnosis not present

## 2014-01-04 DIAGNOSIS — N183 Chronic kidney disease, stage 3 unspecified: Secondary | ICD-10-CM | POA: Diagnosis not present

## 2014-01-04 DIAGNOSIS — I447 Left bundle-branch block, unspecified: Secondary | ICD-10-CM | POA: Diagnosis not present

## 2014-01-04 DIAGNOSIS — J45909 Unspecified asthma, uncomplicated: Secondary | ICD-10-CM | POA: Diagnosis not present

## 2014-01-04 DIAGNOSIS — E669 Obesity, unspecified: Secondary | ICD-10-CM | POA: Diagnosis not present

## 2014-01-11 DIAGNOSIS — F39 Unspecified mood [affective] disorder: Secondary | ICD-10-CM | POA: Diagnosis not present

## 2014-01-22 ENCOUNTER — Other Ambulatory Visit (HOSPITAL_COMMUNITY): Payer: Self-pay | Admitting: Cardiology

## 2014-01-22 DIAGNOSIS — I509 Heart failure, unspecified: Secondary | ICD-10-CM

## 2014-01-22 MED ORDER — CARVEDILOL 6.25 MG PO TABS: ORAL_TABLET | ORAL | Status: DC

## 2014-02-07 ENCOUNTER — Encounter: Payer: Self-pay | Admitting: Internal Medicine

## 2014-02-07 ENCOUNTER — Ambulatory Visit (INDEPENDENT_AMBULATORY_CARE_PROVIDER_SITE_OTHER): Payer: Medicare Other | Admitting: *Deleted

## 2014-02-07 DIAGNOSIS — I5022 Chronic systolic (congestive) heart failure: Secondary | ICD-10-CM | POA: Diagnosis not present

## 2014-02-07 DIAGNOSIS — I428 Other cardiomyopathies: Secondary | ICD-10-CM

## 2014-02-07 NOTE — Progress Notes (Signed)
Remote ICD transmission.   

## 2014-02-15 ENCOUNTER — Ambulatory Visit (HOSPITAL_COMMUNITY)
Admission: RE | Admit: 2014-02-15 | Discharge: 2014-02-15 | Disposition: A | Discharge status: Home / Self Care | Payer: Medicare Other | Source: Ambulatory Visit | Attending: Internal Medicine | Admitting: Internal Medicine

## 2014-02-15 ENCOUNTER — Encounter (HOSPITAL_COMMUNITY): Payer: Self-pay

## 2014-02-15 VITALS — BP 125/73 | HR 78 | Wt 237.5 lb

## 2014-02-15 DIAGNOSIS — Z923 Personal history of irradiation: Secondary | ICD-10-CM | POA: Diagnosis not present

## 2014-02-15 DIAGNOSIS — F411 Generalized anxiety disorder: Secondary | ICD-10-CM | POA: Diagnosis not present

## 2014-02-15 DIAGNOSIS — E785 Hyperlipidemia, unspecified: Secondary | ICD-10-CM | POA: Insufficient documentation

## 2014-02-15 DIAGNOSIS — I5022 Chronic systolic (congestive) heart failure: Secondary | ICD-10-CM | POA: Diagnosis not present

## 2014-02-15 DIAGNOSIS — Z853 Personal history of malignant neoplasm of breast: Secondary | ICD-10-CM | POA: Diagnosis not present

## 2014-02-15 DIAGNOSIS — F3289 Other specified depressive episodes: Secondary | ICD-10-CM | POA: Diagnosis not present

## 2014-02-15 DIAGNOSIS — Z95810 Presence of automatic (implantable) cardiac defibrillator: Secondary | ICD-10-CM

## 2014-02-15 DIAGNOSIS — I509 Heart failure, unspecified: Secondary | ICD-10-CM | POA: Insufficient documentation

## 2014-02-15 DIAGNOSIS — I10 Essential (primary) hypertension: Secondary | ICD-10-CM | POA: Insufficient documentation

## 2014-02-15 DIAGNOSIS — Z9221 Personal history of antineoplastic chemotherapy: Secondary | ICD-10-CM | POA: Insufficient documentation

## 2014-02-15 DIAGNOSIS — F329 Major depressive disorder, single episode, unspecified: Secondary | ICD-10-CM | POA: Diagnosis not present

## 2014-02-15 HISTORY — DX: Chronic systolic (congestive) heart failure: I50.22

## 2014-02-15 LAB — BASIC METABOLIC PANEL
Anion gap: 13 (ref 5–15)
BUN: 30 mg/dL — ABNORMAL HIGH (ref 6–23)
CO2: 28 mEq/L (ref 19–32)
Calcium: 9.2 mg/dL (ref 8.4–10.5)
Chloride: 97 mEq/L (ref 96–112)
Creatinine, Ser: 1.56 mg/dL — ABNORMAL HIGH (ref 0.50–1.10)
GFR calc Af Amer: 40 mL/min — ABNORMAL LOW (ref >90)
GFR calc non Af Amer: 35 mL/min — ABNORMAL LOW (ref >90)
Glucose, Bld: 104 mg/dL — ABNORMAL HIGH (ref 70–99)
Potassium: 4.2 mEq/L (ref 3.7–5.3)
Sodium: 138 mEq/L (ref 137–147)

## 2014-02-15 LAB — PRO B NATRIURETIC PEPTIDE: Pro B Natriuretic peptide (BNP): 330.4 pg/mL — ABNORMAL HIGH (ref 0–125)

## 2014-02-15 NOTE — Progress Notes (Signed)
Patient ID: Rhonda [name], female   DOB: [birth year], 63 y.o.   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 She has had sleep study one year ago- no evidence of sleep apnea.   Follow up for Heart Failure: Doing well. Denies SOB, orthopnea, PND or CP. Able to walk a block before having to stop d/t SOB. Weight at home 233-235 lbs. Following a low salt and drinking less than 2L a day. Walking twice a day about 1/2 mile with no issues.   10/14/12 ECHO EF 35% RV ok  04/12/13 ECHO EF 20-25%, LV moderately dilated 10/11/2013 ECHO EF 45-50%  11/24/12 Creatinine 1.23 Potassium 4.1 12/15/12 Creatinine 2.03 Potassium 3.3  12/22/12 Creatinine 1.2 Potassium 3.8 02/23/13 Creatinine 1.68 K 4.4  05/02/13 K 4.2 Creatinine 1.8  10/11/13 K 3.7 Creatinine 1.69  ROS: All systems negative ex cept as listed in HPI, PMH and Problem List.  Past Medical History  Diagnosis Date  . Unspecified vitamin D deficiency 03/26/2011    does not take meds  . Asthma   . Hypertension   . Heart murmur   . Cardiomyopathy     [name]  . Hypothyroidism   . Anemia   . Breast cancer, stage 1 03/26/2011    left-FINISHED CHEMO  AND RADIATION  . Arthritis     RIGHT KNEE ARTHRITIS AND PAIN-PT TOLD BONE ON BONE  . Depression   . Back pain     DISK PROBLEM  . Complication of anesthesia     PT STATES HER B/P LOW AFTER ONE OF HER SURGERIES--SHE ATTRIBUTES TO LYING FLAT  . Chronic systolic heart  failure     a) NICM b) ECHO (03/2013) EF 20-25% c) ECHO (09/2013) EF 45-50%, grade I DD  . Left bundle branch block     s/p CRT-D (04/2013)  . Gout   . SVD (spontaneous vaginal delivery)     x 2  . Hyperlipidemia   . Anxiety   . Exertional shortness of breath   . Diabetes mellitus     "diet controlled" (05/04/2103)  . Peripheral neuropathy     feet  . GERD (gastroesophageal reflux disease)   . Migraines   . On home oxygen therapy     "2L suppose to be q night" (05/03/2013)    Current Outpatient Prescriptions  Medication Sig Dispense Refill  . albuterol (PROVENTIL HFA;VENTOLIN HFA) 108 (90 BASE) MCG/ACT inhaler Inhale 2 puffs into the lungs every 6 (six) hours as needed. Wheezing       . albuterol (PROVENTIL) (2.5 MG/3ML) 0.083% nebulizer solution Take 2.5 mg by nebulization every 6 (six) hours as needed. Wheezing       . ARIPiprazole (ABILIFY) 2 MG tablet Take 2 mg by mouth every morning.       Marland Kitchen buPROPion (WELLBUTRIN XL) 150 MG 24 hr tablet Take 300 mg by mouth every morning.       . carvedilol (COREG) 6.25 MG tablet Take 1  tablet in the am and 2 tablets in the pm  90 tablet  3  . clonazePAM (KLONOPIN) 0.5 MG tablet Take 0.5 mg by mouth 2 (two) times daily as needed. 1 in morning, 1 at bedtime      . digoxin (LANOXIN) 0.125 MG tablet Take 0.5 tablets (0.0625 mg total) by mouth daily.  15 tablet  6  . febuxostat (ULORIC) 40 MG tablet Take 40 mg by mouth at bedtime.       . ferrous sulfate 325 (65 FE) MG tablet Take 325 mg by mouth 2 (two) times daily with a meal.       . gabapentin (NEURONTIN) 300 MG capsule Take 300 mg by mouth 2 (two) times daily. May take 3 times daily as needed      . levothyroxine (SYNTHROID, LEVOTHROID) 125 MCG tablet Take 125 mcg by mouth every morning. On an empty stomach      . lisinopril (ZESTRIL) 2.5 MG tablet Take 1 tablet (2.5 mg total) by mouth at bedtime.  30 tablet  6  . polyethylene glycol (MIRALAX / GLYCOLAX) packet Take 17 g by mouth 2 (two)  times daily.  14 each    . potassium chloride SA (K-DUR,KLOR-CON) 20 MEQ tablet Take 2 tabs in AM and 1 tab in PM  90 tablet  6  . simvastatin (ZOCOR) 10 MG tablet Take 10 mg by mouth every evening.       Marland Kitchen spironolactone (ALDACTONE) 25 MG tablet Take 1 tablet (25 mg total) by mouth daily.  30 tablet  3  . torsemide (DEMADEX) 20 MG tablet Take 2 tabs in AM and 1 tab in PM      . zolpidem (AMBIEN) 10 MG tablet Take 10 mg by mouth at bedtime as needed. For sleep       No current facility-administered medications for this encounter.    Filed Vitals:   02/15/14 1409  BP: 125/73  Pulse: 78  Weight: 237 lb 8 oz (107.729 kg)  SpO2: 98%    PHYSICAL EXAM: General:  No resp difficulty; Husband present HEENT: normal except for cleft palate repair Neck: supple. JVP difficult to asses due to body habitus but appears mildly elevated; carotids 2+ bilaterally; no bruits. No lymphadenopathy or thryomegaly appreciated. Cor: PMI nonpalpable. Regular rate & rhythm. Paradoxically split S2.  No rubs, gallops or murmurs. Lungs: CTA  Abdomen: morbidly obese, soft, nontender, nondistended. No hepatosplenomegaly. No bruits or masses. Good bowel sounds. Extremities: no cyanosis, clubbing, rash, trace bilateral edema Neuro: alert & orientedx3, cranial nerves grossly intact. Moves all 4 extremities w/o difficulty.    ASSESSMENT & PLAN:  1) Chronic systolic HF: NICM s/p CRT-D, ?Viral myocarditis, cardiomyopathy dates from 1999. EF 45-50% (09/2013) - NYHA II symptoms and volume mildly elevated. Will increase torsemide to 40 mg BID for 2 days and then go back to 40 mg q am and 20 mg q pm. Continue KDUR 40 meq q am and 20 meq q pm. Check BMET and pro-BNp today.  - Optivol interrogated and fluid above threshold, however thoracic impedence trending back to baseline. No Afib. Patient activity 2 hrs a day. 100 % BiV pacing.  - Continue coreg 3.125 mg q am and 6.25 mg q pm, will not titrate d/t SBP 80s the other day at  PCP.  - Continue ACE-I, digoxin and spironolactone at current doses.  - Reinforced the need and importance of daily weights, a low sodium diet, and fluid restriction (less than 2 L a  day). Instructed to call the HF clinic if weight increases more than 3 lbs overnight or 5 lbs in a week.  2) Obesity - Congratulated her on being more active. Encouraged her to continue to try and be as active as possible and and watch portion control.  3) HTN - Stable. No change in medications.    Follow up in 4 months Junie Bame B NP-C 2:15 PM

## 2014-02-15 NOTE — Patient Instructions (Signed)
Doing well.  Increase your torsemide to 40 mg (2 tablets) twice a day for 2 days, then go back to 40 mg (2 tablets) in the morning and 20 mg (1 tablet) in the evening.  Will call about potassium.  Follow up in 4 months  Do the following things EVERYDAY: 1) Weigh yourself in the morning before breakfast. Write it down and keep it in a log. 2) Take your medicines as prescribed 3) Eat low salt foods-Limit salt (sodium) to 2000 mg per day.  4) Stay as active as you can everyday 5) Limit all fluids for the day to less than 2 liters 6)

## 2014-02-20 ENCOUNTER — Other Ambulatory Visit (HOSPITAL_COMMUNITY): Payer: Self-pay

## 2014-02-20 MED ORDER — TORSEMIDE 20 MG PO TABS: ORAL_TABLET | ORAL | Status: DC

## 2014-02-22 ENCOUNTER — Encounter: Payer: Self-pay | Admitting: Internal Medicine

## 2014-02-23 DIAGNOSIS — Z23 Encounter for immunization: Secondary | ICD-10-CM | POA: Diagnosis not present

## 2014-02-25 LAB — MDC_IDC_ENUM_SESS_TYPE_REMOTE
Battery Remaining Longevity: 82 mo
Battery Voltage: 2.99 V
Brady Statistic AP VP Percent: 0.07 %
Brady Statistic AP VS Percent: 0.01 %
Brady Statistic AS VP Percent: 99.91 %
Brady Statistic AS VS Percent: 0.01 %
Brady Statistic RA Percent Paced: 0.08 %
Brady Statistic RV Percent Paced: 99.16 %
Date Time Interrogation Session: 20150916041805
HighPow Impedance: 190 Ohm
HighPow Impedance: 67 Ohm
Lead Channel Impedance Value: 342 Ohm
Lead Channel Impedance Value: 342 Ohm
Lead Channel Impedance Value: 456 Ohm
Lead Channel Impedance Value: 570 Ohm
Lead Channel Impedance Value: 589 Ohm
Lead Channel Pacing Threshold Amplitude: 0.625 V
Lead Channel Pacing Threshold Amplitude: 0.75 V
Lead Channel Pacing Threshold Amplitude: 1.125 V
Lead Channel Pacing Threshold Pulse Width: 0.4 ms
Lead Channel Pacing Threshold Pulse Width: 0.4 ms
Lead Channel Pacing Threshold Pulse Width: 0.4 ms
Lead Channel Sensing Intrinsic Amplitude: 27.25 mV
Lead Channel Sensing Intrinsic Amplitude: 3.75 mV
Lead Channel Setting Pacing Amplitude: 2.25 V
Lead Channel Setting Pacing Amplitude: 2.25 V
Lead Channel Setting Pacing Amplitude: 2.5 V
Lead Channel Setting Pacing Pulse Width: 0.4 ms
Lead Channel Setting Pacing Pulse Width: 0.4 ms
Lead Channel Setting Sensing Sensitivity: 0.3 mV
Zone Setting Detection Interval: 300 ms
Zone Setting Detection Interval: 340 ms
Zone Setting Detection Interval: 350 ms
Zone Setting Detection Interval: 360 ms

## 2014-03-13 ENCOUNTER — Telehealth (HOSPITAL_COMMUNITY): Payer: Self-pay | Admitting: Vascular Surgery

## 2014-03-13 NOTE — Telephone Encounter (Signed)
Pt had a refill of  Torsemide she was suppose to have 120 pills but 90 was called in.Marland Kitchen She wants to know if her Torsemide dosage has been decreased.. Please call to clarify dosage.Marland KitchenMarland Kitchen

## 2014-03-13 NOTE — Telephone Encounter (Signed)
Confirmed, RX with patient Torsemide 20 mg, two in the am and one in the pm

## 2014-03-16 ENCOUNTER — Encounter: Payer: Self-pay | Admitting: *Deleted

## 2014-05-01 ENCOUNTER — Encounter: Payer: Medicare Other | Admitting: Internal Medicine

## 2014-05-02 ENCOUNTER — Encounter: Payer: Self-pay | Admitting: Internal Medicine

## 2014-05-03 ENCOUNTER — Encounter (HOSPITAL_COMMUNITY): Payer: Self-pay | Admitting: Internal Medicine

## 2014-05-08 ENCOUNTER — Other Ambulatory Visit: Payer: Self-pay | Admitting: Nurse Practitioner

## 2014-05-23 ENCOUNTER — Other Ambulatory Visit (HOSPITAL_COMMUNITY): Payer: Self-pay | Admitting: Cardiology

## 2014-05-23 ENCOUNTER — Other Ambulatory Visit: Payer: Self-pay | Admitting: Cardiology

## 2014-05-23 DIAGNOSIS — I509 Heart failure, unspecified: Secondary | ICD-10-CM

## 2014-05-23 MED ORDER — CARVEDILOL 6.25 MG PO TABS: ORAL_TABLET | ORAL | Status: DC

## 2014-05-24 ENCOUNTER — Other Ambulatory Visit (HOSPITAL_COMMUNITY): Payer: Self-pay | Admitting: Cardiology

## 2014-05-24 MED ORDER — SPIRONOLACTONE 25 MG PO TABS: 25.0000 mg | ORAL_TABLET | Freq: Every day | ORAL | Status: DC

## 2014-06-08 ENCOUNTER — Telehealth: Payer: Self-pay

## 2014-06-08 NOTE — Telephone Encounter (Signed)
Order faxed to [facility] for mammogram and ultrasound.  Sent to scan.

## 2014-06-13 DIAGNOSIS — F064 Anxiety disorder due to known physiological condition: Secondary | ICD-10-CM | POA: Diagnosis not present

## 2014-06-19 ENCOUNTER — Other Ambulatory Visit (HOSPITAL_COMMUNITY): Payer: Self-pay | Admitting: Cardiology

## 2014-06-19 MED ORDER — DIGOXIN 125 MCG PO TABS: 0.0625 mg | ORAL_TABLET | Freq: Every day | ORAL | Status: DC

## 2014-06-19 MED ORDER — LISINOPRIL 2.5 MG PO TABS: 2.5000 mg | ORAL_TABLET | Freq: Every day | ORAL | Status: DC

## 2014-06-21 ENCOUNTER — Encounter: Payer: Self-pay | Admitting: Internal Medicine

## 2014-06-21 ENCOUNTER — Ambulatory Visit (INDEPENDENT_AMBULATORY_CARE_PROVIDER_SITE_OTHER): Payer: Medicare Other | Admitting: Internal Medicine

## 2014-06-21 VITALS — BP 100/62 | HR 60 | Ht 62.0 in | Wt 234.6 lb

## 2014-06-21 DIAGNOSIS — I5022 Chronic systolic (congestive) heart failure: Secondary | ICD-10-CM

## 2014-06-21 DIAGNOSIS — Z6841 Body Mass Index (BMI) 40.0 and over, adult: Secondary | ICD-10-CM | POA: Diagnosis not present

## 2014-06-21 DIAGNOSIS — Z4502 Encounter for adjustment and management of automatic implantable cardiac defibrillator: Secondary | ICD-10-CM

## 2014-06-21 DIAGNOSIS — Z95810 Presence of automatic (implantable) cardiac defibrillator: Secondary | ICD-10-CM | POA: Diagnosis not present

## 2014-06-21 DIAGNOSIS — I10 Essential (primary) hypertension: Secondary | ICD-10-CM | POA: Diagnosis not present

## 2014-06-21 LAB — MDC_IDC_ENUM_SESS_TYPE_INCLINIC
Battery Remaining Longevity: 82 mo
Battery Voltage: 2.99 V
Brady Statistic AP VP Percent: 0.91 %
Brady Statistic AP VS Percent: 0.01 %
Brady Statistic AS VP Percent: 99.05 %
Brady Statistic AS VS Percent: 0.02 %
Brady Statistic RA Percent Paced: 0.92 %
Brady Statistic RV Percent Paced: 98.39 %
Date Time Interrogation Session: 20160128103456
HighPow Impedance: 190 Ohm
HighPow Impedance: 61 Ohm
Lead Channel Impedance Value: 342 Ohm
Lead Channel Impedance Value: 342 Ohm
Lead Channel Impedance Value: 494 Ohm
Lead Channel Impedance Value: 570 Ohm
Lead Channel Impedance Value: 627 Ohm
Lead Channel Pacing Threshold Amplitude: 0.625 V
Lead Channel Pacing Threshold Amplitude: 0.625 V
Lead Channel Pacing Threshold Amplitude: 1.25 V
Lead Channel Pacing Threshold Pulse Width: 0.4 ms
Lead Channel Pacing Threshold Pulse Width: 0.4 ms
Lead Channel Pacing Threshold Pulse Width: 0.4 ms
Lead Channel Sensing Intrinsic Amplitude: 27.125 mV
Lead Channel Sensing Intrinsic Amplitude: 29.25 mV
Lead Channel Sensing Intrinsic Amplitude: 3.375 mV
Lead Channel Sensing Intrinsic Amplitude: 4.125 mV
Lead Channel Setting Pacing Amplitude: 1.75 V
Lead Channel Setting Pacing Amplitude: 2.5 V
Lead Channel Setting Pacing Amplitude: 2.5 V
Lead Channel Setting Pacing Pulse Width: 0.4 ms
Lead Channel Setting Pacing Pulse Width: 0.4 ms
Lead Channel Setting Sensing Sensitivity: 0.3 mV
Zone Setting Detection Interval: 300 ms
Zone Setting Detection Interval: 340 ms
Zone Setting Detection Interval: 350 ms
Zone Setting Detection Interval: 360 ms

## 2014-06-21 NOTE — Assessment & Plan Note (Signed)
Her blood pressure is well controlled. She will continue her current meds.  

## 2014-06-21 NOTE — Assessment & Plan Note (Signed)
Her Medtronic BiV ICD is working normally. Will recheck in several months.  

## 2014-06-21 NOTE — Progress Notes (Addendum)
HPI Rhonda [name] returns today for followup. She is a pleasant 64 yo woman with a h/o chronic systolic heart failure, LBBB, s/p BiV ICD implant. In the interim she has done well except for severe dietary indiscretion. She has tried to do better with her diet but still admits to dietary indiscretion and a sedentary lifestyle. Allergies  Allergen Reactions  . Ceftin Anaphylaxis    Face and throat swell   . Shellfish Allergy Other (See Comments)    Gout exacerbation  . Ativan [Lorazepam] Itching  . Allopurinol Nausea Only and Other (See Comments)    weakness  . Lorazepam Itching  . Sulfa Antibiotics Itching  . Ultram [Tramadol Hcl] Itching     Current Outpatient Prescriptions  Medication Sig Dispense Refill  . albuterol (PROVENTIL) (2.5 MG/3ML) 0.083% nebulizer solution Take 2.5 mg by nebulization every 6 (six) hours as needed. Wheezing     . ARIPiprazole (ABILIFY) 2 MG tablet Take 2 mg by mouth every morning.     Marland Kitchen buPROPion (WELLBUTRIN XL) 150 MG 24 hr tablet Take 300 mg by mouth every morning.     . carvedilol (COREG) 6.25 MG tablet Take 1 tablet in the am and 2 tablets in the pm 90 tablet 3  . clonazePAM (KLONOPIN) 0.5 MG tablet Take 0.5 mg by mouth 2 (two) times daily as needed for anxiety. 1 in morning, 1 at bedtime    . digoxin (LANOXIN) 0.125 MG tablet Take 0.5 tablets (0.0625 mg total) by mouth daily. 15 tablet 6  . febuxostat (ULORIC) 40 MG tablet Take 40 mg by mouth at bedtime.     . ferrous sulfate 325 (65 FE) MG tablet Take 325 mg by mouth 2 (two) times daily with a meal.     . gabapentin (NEURONTIN) 300 MG capsule Take 300 mg by mouth 3 (three) times daily.     Marland Kitchen levothyroxine (SYNTHROID, LEVOTHROID) 125 MCG tablet Take 125 mcg by mouth every morning. On an empty stomach    . lisinopril (ZESTRIL) 2.5 MG tablet Take 1 tablet (2.5 mg total) by mouth at bedtime. 30 tablet 6  . metolazone (ZAROXOLYN) 2.5 MG tablet TAKE 1 TAB ON THURSDAYS AND SUNDAYS- TAKE 30 MINUTES  BEFORE TORSEMIDE FOR 30 DAYS 8 tablet 1  . polyethylene glycol (MIRALAX / GLYCOLAX) packet Take 17 g by mouth 2 (two) times daily. 14 each   . potassium chloride SA (K-DUR,KLOR-CON) 20 MEQ tablet Take 2 tabs in AM and 1 tab in PM 90 tablet 6  . simvastatin (ZOCOR) 10 MG tablet Take 10 mg by mouth every evening.     Marland Kitchen spironolactone (ALDACTONE) 25 MG tablet Take 1 tablet (25 mg total) by mouth daily. 30 tablet 3  . torsemide (DEMADEX) 20 MG tablet Take 2 tablets in am and 1 tablet in pm 90 tablet 3  . zolpidem (AMBIEN) 10 MG tablet Take 10 mg by mouth at bedtime as needed. For sleep    . albuterol (PROVENTIL HFA;VENTOLIN HFA) 108 (90 BASE) MCG/ACT inhaler Inhale 2 puffs into the lungs every 6 (six) hours as needed (ON HOLD). Wheezing     No current facility-administered medications for this visit.     Past Medical History  Diagnosis Date  . Unspecified vitamin D deficiency 03/26/2011    does not take meds  . Asthma   . Hypertension   . Heart murmur   . Cardiomyopathy     [name]  . Hypothyroidism   .  Anemia   . Breast cancer, stage 1 03/26/2011    left-FINISHED CHEMO  AND RADIATION  . Arthritis     RIGHT KNEE ARTHRITIS AND PAIN-PT TOLD BONE ON BONE  . Depression   . Back pain     DISK PROBLEM  . Complication of anesthesia     PT STATES HER B/P LOW AFTER ONE OF HER SURGERIES--SHE ATTRIBUTES TO LYING FLAT  . Chronic systolic heart failure     a) NICM b) ECHO (03/2013) EF 20-25% c) ECHO (09/2013) EF 45-50%, grade I DD  . Left bundle branch block     s/p CRT-D (04/2013)  . Gout   . SVD (spontaneous vaginal delivery)     x 2  . Hyperlipidemia   . Anxiety   . Exertional shortness of breath   . Diabetes mellitus     "diet controlled" (05/04/2103)  . Peripheral neuropathy     feet  . GERD (gastroesophageal reflux disease)   . Migraines   . On home oxygen therapy     "2L suppose to be q night" (05/03/2013)    ROS:   All systems reviewed and  negative except as noted in the HPI.   Past Surgical History  Procedure Laterality Date  . Portacath placement  2012  . 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 Cardiac catheterization  2010    NORMAL CORONARY ARTERIES  . Port-a-cath removal  09/23/2011    Procedure: REMOVAL PORT-A-CATH;  Surgeon: Rolm Bookbinder, MD;  Location: [facility];  Service: General;  Laterality: N/A;  Port Removal  . Wisdom tooth extraction    . Colonoscopy    . Hysteroscopy w/d&c  01/20/2012    Procedure: DILATATION AND CURETTAGE /HYSTEROSCOPY;  Surgeon: Margarette Asal, MD;  Location: [facility];  Service: Gynecology;  Laterality: N/A;  with trueclear  . Total knee arthroplasty  05/10/2012    Procedure: TOTAL KNEE ARTHROPLASTY;  Surgeon: Mauri Pole, MD;  Location: [facility];  Service: Orthopedics;  Laterality: Right;  . Bi-ventricular implantable cardioverter defibrillator  (crt-d)  05/03/2013    MDT CRTD implanted by Dr Lovena Le for non ischemic cardiomyopathy  . Tonsillectomy  1960's  . Breast lumpectomy Left 07/28/2010  . Bi-ventricular implantable cardioverter defibrillator N/A 05/03/2013    Procedure: BI-VENTRICULAR IMPLANTABLE CARDIOVERTER DEFIBRILLATOR  (CRT-D);  Surgeon: Evans Lance, MD;  Location: [facility] C[facility] LAB;  Service: Cardiovascular;  Laterality: N/A;     Family History  Problem Relation Age of Onset  . Birth defects Paternal Uncle   . Alzheimer's disease Mother   . CVA Mother   . Hypertension Mother   . Cancer - Colon Father     late 21     History   Social History  . Marital Status: Married    Spouse Name: N/A    Number of Children: N/A  . Years of Education: N/A   Occupational History  . Not on file.   Social History Main Topics  . Smoking status: Former Smoker -- 0.10 packs/day for 26 years    Types: Cigarettes    Quit date: 05/06/2007  . Smokeless tobacco: Never Used  . Alcohol Use: No  . Drug Use: No    . Sexual Activity: Yes   Other Topics Concern  . 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   Education: Research officer, political party   Children: 2   Firearms: No   Therapist, art Use: Always           BP 100/62 mmHg  Pulse 60  Ht 5\' 2"  (1.575 m)  Wt 234 lb 9.6 oz (106.414 kg)  BMI 42.90 kg/m2  Physical Exam:  Well appearing but obese 64 yo woman, NAD HEENT: Unremarkable Neck:  7 cm JVD, no thyromegally Back:  No CVA tenderness Lungs:  Clear with no wheezes HEART:  Regular rate rhythm, no murmurs, no rubs, no clicks Abd:  soft, positive bowel sounds, no organomegally, no rebound, no guarding Ext:  2 plus pulses, trace peripheral edema, no cyanosis, no clubbing Skin:  No rashes no nodules Neuro:  CN II through XII intact, motor grossly intact  EKG - NSR with BiV pacing (AV sequential)  DEVICE  Normal device function.  See PaceArt for details.   Assess/Plan:

## 2014-06-21 NOTE — Assessment & Plan Note (Signed)
Today we discussed the importance of losing weight. She is encouraged to eat less. She is caring for twin grand children. I have asked her to walk more.

## 2014-06-21 NOTE — Assessment & Plan Note (Signed)
Her symptoms are class 2B. She is sedentary and I have asked that she increase her physical activity, lose weight and eat less salt. No medication changes.

## 2014-06-21 NOTE — Patient Instructions (Addendum)
Your physician recommends that you continue on your current medications as directed. Please refer to the Current Medication list given to you today.  Remote monitoring is used to monitor your Pacemaker of ICD from home. This monitoring reduces the number of office visits required to check your device to one time per year. It allows [place] to keep an eye on the functioning of your device to ensure it is working properly. You are scheduled for a device check from home on 09/20/14. You may send your transmission at any time that day. If you have a wireless device, the transmission will be sent automatically. After your physician reviews your transmission, you will receive a postcard with your next transmission date.  Your physician wants you to follow-up in: 12 months with Dr. Lovena Le. You will receive a reminder letter in the mail two months in advance. If you don't receive a letter, please call our office to schedule the follow-up appointment.

## 2014-07-26 ENCOUNTER — Other Ambulatory Visit: Payer: Self-pay

## 2014-07-26 MED ORDER — METOLAZONE 2.5 MG PO TABS: ORAL_TABLET | ORAL | Status: DC

## 2014-08-09 DIAGNOSIS — Z853 Personal history of malignant neoplasm of breast: Secondary | ICD-10-CM | POA: Diagnosis not present

## 2014-08-28 ENCOUNTER — Telehealth: Payer: Self-pay

## 2014-08-28 NOTE — Telephone Encounter (Signed)
Mammogram report dtd 08/08/17 rcvd from [facility].  Reviewed by Dr. Lindi Adie.  Sent to scan.

## 2014-09-06 DIAGNOSIS — I10 Essential (primary) hypertension: Secondary | ICD-10-CM | POA: Diagnosis not present

## 2014-09-06 DIAGNOSIS — Z0000 Encounter for general adult medical examination without abnormal findings: Secondary | ICD-10-CM | POA: Diagnosis not present

## 2014-09-06 DIAGNOSIS — E1140 Type 2 diabetes mellitus with diabetic neuropathy, unspecified: Secondary | ICD-10-CM | POA: Diagnosis not present

## 2014-09-06 DIAGNOSIS — E785 Hyperlipidemia, unspecified: Secondary | ICD-10-CM | POA: Diagnosis not present

## 2014-09-06 DIAGNOSIS — F330 Major depressive disorder, recurrent, mild: Secondary | ICD-10-CM | POA: Diagnosis not present

## 2014-09-06 DIAGNOSIS — E039 Hypothyroidism, unspecified: Secondary | ICD-10-CM | POA: Diagnosis not present

## 2014-09-06 DIAGNOSIS — I5022 Chronic systolic (congestive) heart failure: Secondary | ICD-10-CM | POA: Diagnosis not present

## 2014-09-06 DIAGNOSIS — L659 Nonscarring hair loss, unspecified: Secondary | ICD-10-CM | POA: Diagnosis not present

## 2014-09-20 ENCOUNTER — Ambulatory Visit (INDEPENDENT_AMBULATORY_CARE_PROVIDER_SITE_OTHER): Payer: Commercial Managed Care - HMO | Admitting: *Deleted

## 2014-09-20 DIAGNOSIS — I5022 Chronic systolic (congestive) heart failure: Secondary | ICD-10-CM

## 2014-09-20 DIAGNOSIS — I429 Cardiomyopathy, unspecified: Secondary | ICD-10-CM | POA: Diagnosis not present

## 2014-09-20 NOTE — Progress Notes (Signed)
Remote ICD transmission.   

## 2014-09-21 ENCOUNTER — Other Ambulatory Visit (HOSPITAL_COMMUNITY): Payer: Self-pay | Admitting: Cardiology

## 2014-09-21 DIAGNOSIS — I509 Heart failure, unspecified: Secondary | ICD-10-CM

## 2014-09-21 LAB — MDC_IDC_ENUM_SESS_TYPE_REMOTE
Battery Remaining Longevity: 79 mo
Battery Voltage: 2.99 V
Brady Statistic AP VP Percent: 1.12 %
Brady Statistic AP VS Percent: 0.01 %
Brady Statistic AS VP Percent: 98.85 %
Brady Statistic AS VS Percent: 0.03 %
Brady Statistic RA Percent Paced: 1.12 %
Brady Statistic RV Percent Paced: 99.2 %
Date Time Interrogation Session: 20160428052207
HighPow Impedance: 66 Ohm
Lead Channel Impedance Value: 342 Ohm
Lead Channel Impedance Value: 380 Ohm
Lead Channel Impedance Value: 456 Ohm
Lead Channel Impedance Value: 494 Ohm
Lead Channel Impedance Value: 532 Ohm
Lead Channel Impedance Value: 589 Ohm
Lead Channel Pacing Threshold Amplitude: 0.625 V
Lead Channel Pacing Threshold Amplitude: 0.625 V
Lead Channel Pacing Threshold Amplitude: 1.125 V
Lead Channel Pacing Threshold Pulse Width: 0.4 ms
Lead Channel Pacing Threshold Pulse Width: 0.4 ms
Lead Channel Pacing Threshold Pulse Width: 0.4 ms
Lead Channel Sensing Intrinsic Amplitude: 2.5 mV
Lead Channel Sensing Intrinsic Amplitude: 25.75 mV
Lead Channel Setting Pacing Amplitude: 1.75 V
Lead Channel Setting Pacing Amplitude: 2.25 V
Lead Channel Setting Pacing Amplitude: 2.5 V
Lead Channel Setting Pacing Pulse Width: 0.4 ms
Lead Channel Setting Pacing Pulse Width: 0.4 ms
Lead Channel Setting Sensing Sensitivity: 0.3 mV
Zone Setting Detection Interval: 300 ms
Zone Setting Detection Interval: 340 ms
Zone Setting Detection Interval: 350 ms
Zone Setting Detection Interval: 360 ms

## 2014-09-21 MED ORDER — CARVEDILOL 6.25 MG PO TABS: ORAL_TABLET | ORAL | Status: DC

## 2014-10-02 DIAGNOSIS — J309 Allergic rhinitis, unspecified: Secondary | ICD-10-CM | POA: Diagnosis not present

## 2014-10-02 DIAGNOSIS — J029 Acute pharyngitis, unspecified: Secondary | ICD-10-CM | POA: Diagnosis not present

## 2014-10-04 ENCOUNTER — Encounter: Payer: Self-pay | Admitting: Cardiology

## 2014-10-04 ENCOUNTER — Ambulatory Visit (HOSPITAL_COMMUNITY)
Admission: RE | Admit: 2014-10-04 | Discharge: 2014-10-04 | Disposition: A | Discharge status: Home / Self Care | Payer: Commercial Managed Care - HMO | Source: Ambulatory Visit | Attending: Internal Medicine | Admitting: Internal Medicine

## 2014-10-04 VITALS — BP 110/62 | HR 89 | Wt 224.0 lb

## 2014-10-04 DIAGNOSIS — Z853 Personal history of malignant neoplasm of breast: Secondary | ICD-10-CM | POA: Insufficient documentation

## 2014-10-04 DIAGNOSIS — Z9221 Personal history of antineoplastic chemotherapy: Secondary | ICD-10-CM | POA: Diagnosis not present

## 2014-10-04 DIAGNOSIS — I5022 Chronic systolic (congestive) heart failure: Secondary | ICD-10-CM | POA: Diagnosis not present

## 2014-10-04 DIAGNOSIS — E559 Vitamin D deficiency, unspecified: Secondary | ICD-10-CM | POA: Diagnosis not present

## 2014-10-04 DIAGNOSIS — I10 Essential (primary) hypertension: Secondary | ICD-10-CM | POA: Insufficient documentation

## 2014-10-04 DIAGNOSIS — Z79899 Other long term (current) drug therapy: Secondary | ICD-10-CM | POA: Diagnosis not present

## 2014-10-04 DIAGNOSIS — E119 Type 2 diabetes mellitus without complications: Secondary | ICD-10-CM | POA: Diagnosis not present

## 2014-10-04 DIAGNOSIS — Z923 Personal history of irradiation: Secondary | ICD-10-CM | POA: Insufficient documentation

## 2014-10-04 DIAGNOSIS — K219 Gastro-esophageal reflux disease without esophagitis: Secondary | ICD-10-CM | POA: Diagnosis not present

## 2014-10-04 DIAGNOSIS — I429 Cardiomyopathy, unspecified: Secondary | ICD-10-CM | POA: Diagnosis not present

## 2014-10-04 DIAGNOSIS — E785 Hyperlipidemia, unspecified: Secondary | ICD-10-CM | POA: Insufficient documentation

## 2014-10-04 DIAGNOSIS — E039 Hypothyroidism, unspecified: Secondary | ICD-10-CM | POA: Insufficient documentation

## 2014-10-04 DIAGNOSIS — I159 Secondary hypertension, unspecified: Secondary | ICD-10-CM | POA: Diagnosis not present

## 2014-10-04 DIAGNOSIS — J45909 Unspecified asthma, uncomplicated: Secondary | ICD-10-CM | POA: Diagnosis not present

## 2014-10-04 LAB — BASIC METABOLIC PANEL
Anion gap: 10 (ref 5–15)
BUN: 21 mg/dL — ABNORMAL HIGH (ref 6–20)
CO2: 34 mmol/L — ABNORMAL HIGH (ref 22–32)
Calcium: 9 mg/dL (ref 8.9–10.3)
Chloride: 96 mmol/L — ABNORMAL LOW (ref 101–111)
Creatinine, Ser: 1.4 mg/dL — ABNORMAL HIGH (ref 0.44–1.00)
GFR calc Af Amer: 46 mL/min — ABNORMAL LOW (ref >60)
GFR calc non Af Amer: 40 mL/min — ABNORMAL LOW (ref >60)
Glucose, Bld: 104 mg/dL — ABNORMAL HIGH (ref 65–99)
Potassium: 3.9 mmol/L (ref 3.5–5.1)
Sodium: 140 mmol/L (ref 135–145)

## 2014-10-04 NOTE — Patient Instructions (Signed)
Lab today  Your physician has requested that you have an echocardiogram. Echocardiography is a painless test that uses sound waves to create images of your heart. It provides your doctor with information about the size and shape of your heart and how well your heart's chambers and valves are working. This procedure takes approximately one hour. There are no restrictions for this procedure.  IN 6 MONTHS  We will contact you in 6 months to schedule your next appointment.

## 2014-10-04 NOTE — Progress Notes (Signed)
Patient ID: Rhonda [name], female   DOB: Sep 28, 1950, 64 y.o.   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 She has had sleep study one year ago- no evidence of sleep apnea.   Follow up for Heart Failure: Doing well. Denies SOB, orthopnea, PND or CP. Weight at home 220 pounds. Says weight went up after her Mom died. [facility]ed exercising at the [facility].  Following a low salt and drinking less than 2L a day.  10/14/12 ECHO EF 35% RV ok  04/12/13 ECHO EF 20-25%, LV moderately dilated 10/11/2013 ECHO EF 45-50%  11/24/12 Creatinine 1.23 Potassium 4.1 12/15/12 Creatinine 2.03 Potassium 3.3  12/22/12 Creatinine 1.2 Potassium 3.8 02/23/13 Creatinine 1.68 K 4.4  05/02/13 K 4.2 Creatinine 1.8  10/11/13 K 3.7 Creatinine 1.69  ROS: All systems negative ex cept as listed in HPI, PMH and Problem List.  Past Medical History  Diagnosis Date  . Unspecified vitamin D deficiency 03/26/2011    does not take meds  . Asthma   . Hypertension   . Heart murmur   . Cardiomyopathy     PT'S CARDIOLOGIST IS Rhonda. Tressia Miners [name]  . Hypothyroidism   . Anemia   . Breast cancer, stage 1 03/26/2011    left-FINISHED CHEMO  AND RADIATION  . Arthritis     RIGHT KNEE ARTHRITIS AND PAIN-PT TOLD BONE ON BONE  . Depression   . Back pain     DISK PROBLEM  . Complication of anesthesia     PT STATES HER B/P LOW AFTER ONE OF HER SURGERIES--SHE ATTRIBUTES TO LYING FLAT  . Chronic systolic heart failure     a) NICM b) ECHO (03/2013) EF 20-25% c) ECHO  (09/2013) EF 45-50%, grade I DD  . Left bundle branch block     s/p CRT-D (04/2013)  . Gout   . SVD (spontaneous vaginal delivery)     x 2  . Hyperlipidemia   . Anxiety   . Exertional shortness of breath   . Diabetes mellitus     "diet controlled" (05/04/2103)  . Peripheral neuropathy     feet  . GERD (gastroesophageal reflux disease)   . Migraines   . On home oxygen therapy     "2L suppose to be q night" (05/03/2013)    Current Outpatient Prescriptions  Medication Sig Dispense Refill  . albuterol (PROVENTIL HFA;VENTOLIN HFA) 108 (90 BASE) MCG/ACT inhaler Inhale 2 puffs into the lungs every 6 (six) hours as needed (ON HOLD). Wheezing    . albuterol (PROVENTIL) (2.5 MG/3ML) 0.083% nebulizer solution Take 2.5 mg by nebulization every 6 (six) hours as needed. Wheezing     . ARIPiprazole (ABILIFY) 2 MG tablet Take 2 mg by mouth every morning.     Marland Kitchen buPROPion (WELLBUTRIN XL) 150 MG 24 hr tablet Take 300 mg by mouth every morning.     . carvedilol (COREG) 6.25 MG tablet Take 1 tablet in the am and 2 tablets in the pm 90 tablet 3  . clonazePAM (KLONOPIN) 0.5 MG  tablet Take 0.5 mg by mouth 2 (two) times daily as needed for anxiety. 1 in morning, 1 at bedtime    . digoxin (LANOXIN) 0.125 MG tablet Take 0.5 tablets (0.0625 mg total) by mouth daily. 15 tablet 6  . febuxostat (ULORIC) 40 MG tablet Take 40 mg by mouth at bedtime.     . ferrous sulfate 325 (65 FE) MG tablet Take 325 mg by mouth 2 (two) times daily with a meal.     . gabapentin (NEURONTIN) 300 MG capsule Take 300 mg by mouth 3 (three) times daily.     Marland Kitchen levothyroxine (SYNTHROID, LEVOTHROID) 125 MCG tablet Take 125 mcg by mouth every morning. On an empty stomach    . lisinopril (ZESTRIL) 2.5 MG tablet Take 1 tablet (2.5 mg total) by mouth at bedtime. 30 tablet 6  . metolazone (ZAROXOLYN) 2.5 MG tablet Take 2.5 mg by mouth as needed.    . polyethylene glycol (MIRALAX / GLYCOLAX) packet Take 17 g by mouth 2 (two) times daily. 14 each    . potassium chloride SA (K-DUR,KLOR-CON) 20 MEQ tablet Take 2 tabs in AM and 1 tab in PM 90 tablet 6  . simvastatin (ZOCOR) 10 MG tablet Take 10 mg by mouth every evening.     Marland Kitchen spironolactone (ALDACTONE) 25 MG tablet Take 1 tablet (25 mg total) by mouth daily. 30 tablet 3  . torsemide (DEMADEX) 20 MG tablet Take 2 tablets in am and 1 tablet in pm 90 tablet 3  . zolpidem (AMBIEN) 10 MG tablet Take 10 mg by mouth at bedtime as needed. For sleep     No current facility-administered medications for this encounter.    Filed Vitals:   10/04/14 1404  BP: 110/62  Pulse: 89  Weight: 224 lb (101.606 kg)  SpO2: 98%    PHYSICAL EXAM: General:  No resp difficulty; Husband present HEENT: normal except for cleft palate repair Neck: supple. JVP difficult to asses due to body habitus but appears mildly elevated; carotids 2+ bilaterally; no bruits. No lymphadenopathy or thryomegaly appreciated. Cor: PMI nonpalpable. Regular rate & rhythm. Paradoxically split S2.  No rubs, gallops or murmurs. Lungs: CTA  Abdomen: morbidly obese, soft, nontender, nondistended. No hepatosplenomegaly. No bruits or masses. Good bowel sounds. Extremities: no cyanosis, clubbing, rash, trace bilateral edema Neuro: alert & orientedx3, cranial nerves grossly intact. Moves all 4 extremities w/o difficulty.    ASSESSMENT & PLAN:  1) Chronic systolic HF: NICM s/p CRT-D, ?Viral myocarditis, cardiomyopathy dates from 1999. EF 45-50% (09/2013). - NYHA II symptoms and volume ok. Continue torsemide 40 mg in am and 20 mg in pm.   Continue KDUR 40 meq q am and 20 meq q pm. - Optivol interrogated and fluid below threshold, no Afib, and activity 2 hrs a day.   - Continue coreg 3.125 mg q am and 6.25 mg q pm.   - Continue ACE-I, digoxin and spironolactone at current doses.   - Check BMET now.  - Reinforced the need and importance of daily weights, a low sodium diet, and fluid restriction (less than 2 L a day). Instructed to call the  HF clinic if weight increases more than 3 lbs overnight or 5 lbs in a week.  2) Obesity - Needs to lose weight. Encouraged to cut back portions.   3) HTN - Stable. Continue current regimen. No change in medications.    Follow up in 6 months with an ECHO Bernardine Langworthy NP-C 2:37 PM

## 2014-10-12 ENCOUNTER — Encounter: Payer: Self-pay | Admitting: Internal Medicine

## 2014-10-23 DIAGNOSIS — F064 Anxiety disorder due to known physiological condition: Secondary | ICD-10-CM | POA: Diagnosis not present

## 2014-10-30 ENCOUNTER — Encounter: Payer: Self-pay | Admitting: Internal Medicine

## 2014-11-12 ENCOUNTER — Other Ambulatory Visit (HOSPITAL_COMMUNITY): Payer: Self-pay | Admitting: *Deleted

## 2014-11-12 MED ORDER — TORSEMIDE 20 MG PO TABS: ORAL_TABLET | ORAL | Status: DC

## 2014-11-15 DIAGNOSIS — R5383 Other fatigue: Secondary | ICD-10-CM | POA: Diagnosis not present

## 2014-11-15 DIAGNOSIS — M791 Myalgia: Secondary | ICD-10-CM | POA: Diagnosis not present

## 2014-11-15 DIAGNOSIS — L659 Nonscarring hair loss, unspecified: Secondary | ICD-10-CM | POA: Diagnosis not present

## 2014-11-15 DIAGNOSIS — R768 Other specified abnormal immunological findings in serum: Secondary | ICD-10-CM | POA: Diagnosis not present

## 2014-11-27 ENCOUNTER — Other Ambulatory Visit (HOSPITAL_COMMUNITY): Payer: Self-pay | Admitting: *Deleted

## 2014-11-27 DIAGNOSIS — J449 Chronic obstructive pulmonary disease, unspecified: Secondary | ICD-10-CM | POA: Diagnosis not present

## 2014-11-27 MED ORDER — SPIRONOLACTONE 25 MG PO TABS: 25.0000 mg | ORAL_TABLET | Freq: Every day | ORAL | Status: DC

## 2014-12-03 DIAGNOSIS — H25013 Cortical age-related cataract, bilateral: Secondary | ICD-10-CM | POA: Diagnosis not present

## 2014-12-03 DIAGNOSIS — E119 Type 2 diabetes mellitus without complications: Secondary | ICD-10-CM | POA: Diagnosis not present

## 2014-12-03 DIAGNOSIS — H2513 Age-related nuclear cataract, bilateral: Secondary | ICD-10-CM | POA: Diagnosis not present

## 2014-12-13 DIAGNOSIS — F0632 Mood disorder due to known physiological condition with major depressive-like episode: Secondary | ICD-10-CM | POA: Diagnosis not present

## 2014-12-20 ENCOUNTER — Ambulatory Visit (INDEPENDENT_AMBULATORY_CARE_PROVIDER_SITE_OTHER): Payer: Commercial Managed Care - HMO

## 2014-12-20 DIAGNOSIS — I5022 Chronic systolic (congestive) heart failure: Secondary | ICD-10-CM

## 2014-12-20 DIAGNOSIS — Z95810 Presence of automatic (implantable) cardiac defibrillator: Secondary | ICD-10-CM | POA: Diagnosis not present

## 2014-12-20 DIAGNOSIS — L639 Alopecia areata, unspecified: Secondary | ICD-10-CM | POA: Diagnosis not present

## 2014-12-28 DIAGNOSIS — J449 Chronic obstructive pulmonary disease, unspecified: Secondary | ICD-10-CM | POA: Diagnosis not present

## 2014-12-31 LAB — CUP PACEART REMOTE DEVICE CHECK
Battery Remaining Longevity: 76 mo
Battery Voltage: 2.99 V
Brady Statistic AP VP Percent: 0.06 %
Brady Statistic AP VS Percent: 0.01 %
Brady Statistic AS VP Percent: 99.91 %
Brady Statistic AS VS Percent: 0.02 %
Brady Statistic RA Percent Paced: 0.07 %
Brady Statistic RV Percent Paced: 98.79 %
Date Time Interrogation Session: 20160728052403
HighPow Impedance: 73 Ohm
Lead Channel Impedance Value: 342 Ohm
Lead Channel Impedance Value: 380 Ohm
Lead Channel Impedance Value: 456 Ohm
Lead Channel Impedance Value: 494 Ohm
Lead Channel Impedance Value: 570 Ohm
Lead Channel Impedance Value: 646 Ohm
Lead Channel Pacing Threshold Amplitude: 0.625 V
Lead Channel Pacing Threshold Amplitude: 0.625 V
Lead Channel Pacing Threshold Amplitude: 1 V
Lead Channel Pacing Threshold Pulse Width: 0.4 ms
Lead Channel Pacing Threshold Pulse Width: 0.4 ms
Lead Channel Pacing Threshold Pulse Width: 0.4 ms
Lead Channel Sensing Intrinsic Amplitude: 24.75 mV
Lead Channel Sensing Intrinsic Amplitude: 24.75 mV
Lead Channel Sensing Intrinsic Amplitude: 3.5 mV
Lead Channel Sensing Intrinsic Amplitude: 3.5 mV
Lead Channel Setting Pacing Amplitude: 1.75 V
Lead Channel Setting Pacing Amplitude: 2.25 V
Lead Channel Setting Pacing Amplitude: 2.5 V
Lead Channel Setting Pacing Pulse Width: 0.4 ms
Lead Channel Setting Pacing Pulse Width: 0.4 ms
Lead Channel Setting Sensing Sensitivity: 0.3 mV
Zone Setting Detection Interval: 300 ms
Zone Setting Detection Interval: 340 ms
Zone Setting Detection Interval: 350 ms
Zone Setting Detection Interval: 360 ms

## 2015-01-02 DIAGNOSIS — Z124 Encounter for screening for malignant neoplasm of cervix: Secondary | ICD-10-CM | POA: Diagnosis not present

## 2015-01-02 DIAGNOSIS — Z6841 Body Mass Index (BMI) 40.0 and over, adult: Secondary | ICD-10-CM | POA: Diagnosis not present

## 2015-01-02 DIAGNOSIS — Z01419 Encounter for gynecological examination (general) (routine) without abnormal findings: Secondary | ICD-10-CM | POA: Diagnosis not present

## 2015-01-07 ENCOUNTER — Other Ambulatory Visit (HOSPITAL_COMMUNITY): Payer: Self-pay | Admitting: *Deleted

## 2015-01-07 DIAGNOSIS — I509 Heart failure, unspecified: Secondary | ICD-10-CM

## 2015-01-07 MED ORDER — CARVEDILOL 6.25 MG PO TABS: ORAL_TABLET | ORAL | Status: DC

## 2015-01-07 MED ORDER — DIGOXIN 125 MCG PO TABS: 0.0625 mg | ORAL_TABLET | Freq: Every day | ORAL | Status: DC

## 2015-01-07 MED ORDER — LISINOPRIL 2.5 MG PO TABS: 2.5000 mg | ORAL_TABLET | Freq: Every day | ORAL | Status: DC

## 2015-01-24 ENCOUNTER — Encounter: Payer: Self-pay | Admitting: Cardiology

## 2015-01-29 ENCOUNTER — Other Ambulatory Visit (HOSPITAL_COMMUNITY): Payer: Self-pay | Admitting: *Deleted

## 2015-01-29 DIAGNOSIS — H18411 Arcus senilis, right eye: Secondary | ICD-10-CM | POA: Diagnosis not present

## 2015-01-29 DIAGNOSIS — H2513 Age-related nuclear cataract, bilateral: Secondary | ICD-10-CM | POA: Diagnosis not present

## 2015-01-29 DIAGNOSIS — H2511 Age-related nuclear cataract, right eye: Secondary | ICD-10-CM | POA: Diagnosis not present

## 2015-01-29 DIAGNOSIS — H25011 Cortical age-related cataract, right eye: Secondary | ICD-10-CM | POA: Diagnosis not present

## 2015-01-29 DIAGNOSIS — H02411 Mechanical ptosis of right eyelid: Secondary | ICD-10-CM | POA: Diagnosis not present

## 2015-01-29 MED ORDER — POTASSIUM CHLORIDE CRYS ER 20 MEQ PO TBCR: EXTENDED_RELEASE_TABLET | ORAL | Status: DC

## 2015-02-05 ENCOUNTER — Encounter: Payer: Self-pay | Admitting: Internal Medicine

## 2015-02-07 DIAGNOSIS — H16223 Keratoconjunctivitis sicca, not specified as Sjogren's, bilateral: Secondary | ICD-10-CM | POA: Diagnosis not present

## 2015-02-27 DIAGNOSIS — J449 Chronic obstructive pulmonary disease, unspecified: Secondary | ICD-10-CM | POA: Diagnosis not present

## 2015-02-27 DIAGNOSIS — Z23 Encounter for immunization: Secondary | ICD-10-CM | POA: Diagnosis not present

## 2015-03-21 ENCOUNTER — Telehealth: Payer: Self-pay | Admitting: Cardiology

## 2015-03-21 ENCOUNTER — Ambulatory Visit (INDEPENDENT_AMBULATORY_CARE_PROVIDER_SITE_OTHER): Payer: Commercial Managed Care - HMO | Admitting: *Deleted

## 2015-03-21 DIAGNOSIS — I429 Cardiomyopathy, unspecified: Secondary | ICD-10-CM

## 2015-03-21 DIAGNOSIS — I5022 Chronic systolic (congestive) heart failure: Secondary | ICD-10-CM

## 2015-03-21 DIAGNOSIS — I428 Other cardiomyopathies: Secondary | ICD-10-CM

## 2015-03-21 NOTE — Progress Notes (Signed)
Remote ICD transmission.   

## 2015-03-21 NOTE — Telephone Encounter (Signed)
Spoke with pt and reminded pt of remote transmission that is due today. Pt verbalized understanding.   

## 2015-03-30 DIAGNOSIS — J449 Chronic obstructive pulmonary disease, unspecified: Secondary | ICD-10-CM | POA: Diagnosis not present

## 2015-04-01 DIAGNOSIS — H25811 Combined forms of age-related cataract, right eye: Secondary | ICD-10-CM | POA: Diagnosis not present

## 2015-04-01 DIAGNOSIS — H2511 Age-related nuclear cataract, right eye: Secondary | ICD-10-CM | POA: Diagnosis not present

## 2015-04-02 DIAGNOSIS — H2512 Age-related nuclear cataract, left eye: Secondary | ICD-10-CM | POA: Diagnosis not present

## 2015-04-02 DIAGNOSIS — H25011 Cortical age-related cataract, right eye: Secondary | ICD-10-CM | POA: Diagnosis not present

## 2015-04-04 LAB — CUP PACEART REMOTE DEVICE CHECK
Battery Remaining Longevity: 70 mo
Battery Voltage: 2.97 V
Brady Statistic AP VP Percent: 0.15 %
Brady Statistic AP VS Percent: 0.01 %
Brady Statistic AS VP Percent: 99.81 %
Brady Statistic AS VS Percent: 0.03 %
Brady Statistic RA Percent Paced: 0.16 %
Brady Statistic RV Percent Paced: 97.7 %
Date Time Interrogation Session: 20161027151839
HighPow Impedance: 68 Ohm
Implantable Lead Implant Date: 20141210
Implantable Lead Implant Date: 20141210
Implantable Lead Implant Date: 20141210
Implantable Lead Location: 753858
Implantable Lead Location: 753859
Implantable Lead Location: 753860
Implantable Lead Model: 4396
Implantable Lead Model: 5076
Implantable Lead Model: 6935
Lead Channel Impedance Value: 342 Ohm
Lead Channel Impedance Value: 380 Ohm
Lead Channel Impedance Value: 456 Ohm
Lead Channel Impedance Value: 494 Ohm
Lead Channel Impedance Value: 589 Ohm
Lead Channel Impedance Value: 589 Ohm
Lead Channel Pacing Threshold Amplitude: 0.625 V
Lead Channel Pacing Threshold Amplitude: 0.875 V
Lead Channel Pacing Threshold Amplitude: 1.125 V
Lead Channel Pacing Threshold Pulse Width: 0.4 ms
Lead Channel Pacing Threshold Pulse Width: 0.4 ms
Lead Channel Pacing Threshold Pulse Width: 0.4 ms
Lead Channel Sensing Intrinsic Amplitude: 2.375 mV
Lead Channel Sensing Intrinsic Amplitude: 2.375 mV
Lead Channel Sensing Intrinsic Amplitude: 23 mV
Lead Channel Sensing Intrinsic Amplitude: 23 mV
Lead Channel Setting Pacing Amplitude: 2 V
Lead Channel Setting Pacing Amplitude: 2.25 V
Lead Channel Setting Pacing Amplitude: 2.5 V
Lead Channel Setting Pacing Pulse Width: 0.4 ms
Lead Channel Setting Pacing Pulse Width: 0.4 ms
Lead Channel Setting Sensing Sensitivity: 0.3 mV

## 2015-04-05 ENCOUNTER — Encounter: Payer: Self-pay | Admitting: Cardiology

## 2015-04-22 DIAGNOSIS — H25812 Combined forms of age-related cataract, left eye: Secondary | ICD-10-CM | POA: Diagnosis not present

## 2015-04-22 DIAGNOSIS — H25011 Cortical age-related cataract, right eye: Secondary | ICD-10-CM | POA: Diagnosis not present

## 2015-04-22 DIAGNOSIS — H2512 Age-related nuclear cataract, left eye: Secondary | ICD-10-CM | POA: Diagnosis not present

## 2015-04-29 DIAGNOSIS — J449 Chronic obstructive pulmonary disease, unspecified: Secondary | ICD-10-CM | POA: Diagnosis not present

## 2015-05-02 DIAGNOSIS — F0632 Mood disorder due to known physiological condition with major depressive-like episode: Secondary | ICD-10-CM | POA: Diagnosis not present

## 2015-05-09 DIAGNOSIS — E039 Hypothyroidism, unspecified: Secondary | ICD-10-CM | POA: Diagnosis not present

## 2015-05-09 DIAGNOSIS — I10 Essential (primary) hypertension: Secondary | ICD-10-CM | POA: Diagnosis not present

## 2015-05-09 DIAGNOSIS — M1990 Unspecified osteoarthritis, unspecified site: Secondary | ICD-10-CM | POA: Diagnosis not present

## 2015-05-09 DIAGNOSIS — E785 Hyperlipidemia, unspecified: Secondary | ICD-10-CM | POA: Diagnosis not present

## 2015-05-09 DIAGNOSIS — E1140 Type 2 diabetes mellitus with diabetic neuropathy, unspecified: Secondary | ICD-10-CM | POA: Diagnosis not present

## 2015-05-09 DIAGNOSIS — E559 Vitamin D deficiency, unspecified: Secondary | ICD-10-CM | POA: Diagnosis not present

## 2015-05-15 ENCOUNTER — Encounter (HOSPITAL_COMMUNITY): Payer: Self-pay | Admitting: Vascular Surgery

## 2015-05-21 ENCOUNTER — Other Ambulatory Visit (HOSPITAL_COMMUNITY): Payer: Self-pay | Admitting: *Deleted

## 2015-05-21 MED ORDER — SPIRONOLACTONE 25 MG PO TABS: 25.0000 mg | ORAL_TABLET | Freq: Every day | ORAL | Status: DC

## 2015-05-30 DIAGNOSIS — J449 Chronic obstructive pulmonary disease, unspecified: Secondary | ICD-10-CM | POA: Diagnosis not present

## 2015-06-07 ENCOUNTER — Other Ambulatory Visit (HOSPITAL_COMMUNITY): Payer: Self-pay | Admitting: *Deleted

## 2015-06-07 DIAGNOSIS — I509 Heart failure, unspecified: Secondary | ICD-10-CM

## 2015-06-07 MED ORDER — CARVEDILOL 6.25 MG PO TABS: ORAL_TABLET | ORAL | Status: DC

## 2015-06-08 DIAGNOSIS — M542 Cervicalgia: Secondary | ICD-10-CM | POA: Diagnosis not present

## 2015-06-08 DIAGNOSIS — M546 Pain in thoracic spine: Secondary | ICD-10-CM | POA: Diagnosis not present

## 2015-06-11 ENCOUNTER — Other Ambulatory Visit (HOSPITAL_COMMUNITY): Payer: Self-pay | Admitting: *Deleted

## 2015-06-11 MED ORDER — TORSEMIDE 20 MG PO TABS: ORAL_TABLET | ORAL | Status: DC

## 2015-06-12 DIAGNOSIS — M542 Cervicalgia: Secondary | ICD-10-CM | POA: Diagnosis not present

## 2015-06-12 DIAGNOSIS — M546 Pain in thoracic spine: Secondary | ICD-10-CM | POA: Diagnosis not present

## 2015-06-12 DIAGNOSIS — M40202 Unspecified kyphosis, cervical region: Secondary | ICD-10-CM | POA: Diagnosis not present

## 2015-06-20 DIAGNOSIS — M546 Pain in thoracic spine: Secondary | ICD-10-CM | POA: Diagnosis not present

## 2015-06-20 DIAGNOSIS — M40202 Unspecified kyphosis, cervical region: Secondary | ICD-10-CM | POA: Diagnosis not present

## 2015-06-20 DIAGNOSIS — M542 Cervicalgia: Secondary | ICD-10-CM | POA: Diagnosis not present

## 2015-06-26 ENCOUNTER — Encounter: Payer: Self-pay | Admitting: *Deleted

## 2015-06-27 ENCOUNTER — Encounter (HOSPITAL_COMMUNITY): Payer: Commercial Managed Care - HMO

## 2015-06-27 ENCOUNTER — Ambulatory Visit (HOSPITAL_COMMUNITY): Payer: Commercial Managed Care - HMO

## 2015-06-30 DIAGNOSIS — J449 Chronic obstructive pulmonary disease, unspecified: Secondary | ICD-10-CM | POA: Diagnosis not present

## 2015-07-04 DIAGNOSIS — F0632 Mood disorder due to known physiological condition with major depressive-like episode: Secondary | ICD-10-CM | POA: Diagnosis not present

## 2015-07-10 ENCOUNTER — Ambulatory Visit (HOSPITAL_BASED_OUTPATIENT_CLINIC_OR_DEPARTMENT_OTHER)
Admission: RE | Admit: 2015-07-10 | Discharge: 2015-07-10 | Disposition: A | Discharge status: Home / Self Care | Payer: Commercial Managed Care - HMO | Source: Ambulatory Visit | Attending: Family Medicine | Admitting: Family Medicine

## 2015-07-10 ENCOUNTER — Ambulatory Visit (HOSPITAL_COMMUNITY)
Admission: RE | Admit: 2015-07-10 | Discharge: 2015-07-10 | Disposition: A | Discharge status: Home / Self Care | Payer: Commercial Managed Care - HMO | Source: Ambulatory Visit | Attending: Family Medicine | Admitting: Family Medicine

## 2015-07-10 VITALS — BP 106/70 | HR 79 | Wt 247.0 lb

## 2015-07-10 DIAGNOSIS — I110 Hypertensive heart disease with heart failure: Secondary | ICD-10-CM | POA: Insufficient documentation

## 2015-07-10 DIAGNOSIS — I5022 Chronic systolic (congestive) heart failure: Secondary | ICD-10-CM | POA: Diagnosis not present

## 2015-07-10 DIAGNOSIS — E785 Hyperlipidemia, unspecified: Secondary | ICD-10-CM | POA: Diagnosis not present

## 2015-07-10 DIAGNOSIS — I158 Other secondary hypertension: Secondary | ICD-10-CM

## 2015-07-10 DIAGNOSIS — E119 Type 2 diabetes mellitus without complications: Secondary | ICD-10-CM | POA: Diagnosis not present

## 2015-07-10 LAB — BASIC METABOLIC PANEL
Anion gap: 8 (ref 5–15)
BUN: 23 mg/dL — ABNORMAL HIGH (ref 6–20)
CO2: 32 mmol/L (ref 22–32)
Calcium: 9.3 mg/dL (ref 8.9–10.3)
Chloride: 100 mmol/L — ABNORMAL LOW (ref 101–111)
Creatinine, Ser: 1.49 mg/dL — ABNORMAL HIGH (ref 0.44–1.00)
GFR calc Af Amer: 42 mL/min — ABNORMAL LOW (ref >60)
GFR calc non Af Amer: 36 mL/min — ABNORMAL LOW (ref >60)
Glucose, Bld: 94 mg/dL (ref 65–99)
Potassium: 4.3 mmol/L (ref 3.5–5.1)
Sodium: 140 mmol/L (ref 135–145)

## 2015-07-10 NOTE — Patient Instructions (Signed)
STOP Digoxin  Labs today  Your physician recommends that you schedule a follow-up appointment in: 6 months  Do the following things EVERYDAY: 1) Weigh yourself in the morning before breakfast. Write it down and keep it in a log. 2) Take your medicines as prescribed 3) Eat low salt foods-Limit salt (sodium) to 2000 mg per day.  4) Stay as active as you can everyday 5) Limit all fluids for the day to less than 2 liters 6)

## 2015-07-10 NOTE — Progress Notes (Signed)
  Echocardiogram 2D Echocardiogram has been performed.  Rhonda [name] 07/10/2015, 1:52 PM

## 2015-07-10 NOTE — Progress Notes (Signed)
Patient ID: LECHELLE [name], female   DOB: Oct 19, 1950, 65 y.o.   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 She has had sleep study with no  evidence of sleep apnea.   Follow up for Heart Failure:  Overall feeling ok. Denies SOB, orthopnea, PND or CP. Weight at home trending up from 220s to 240. Says weight went up because she is taking care of her Grandsons. She has stopped exercising. Not following a low salt diet. Eating chinese food and soup 3 times a day week. Following a low salt and drinking less than 2L a day. Taking all medications.   10/14/12 ECHO EF 35% RV ok  04/12/13 ECHO EF 20-25%, LV moderately dilated 10/11/2013 ECHO EF 45-50%  11/24/12 Creatinine 1.23 Potassium 4.1 12/15/12 Creatinine 2.03 Potassium 3.3  12/22/12 Creatinine 1.2 Potassium 3.8 02/23/13 Creatinine 1.68 K 4.4  05/02/13 K 4.2 Creatinine 1.8  10/11/13 K 3.7 Creatinine 1.69  ROS: All systems negative ex cept as listed in HPI, PMH and Problem List.  Past Medical History  Diagnosis Date  . Unspecified vitamin D deficiency 03/26/2011    does not take meds  . Asthma   . Hypertension   . Heart murmur   . Cardiomyopathy     [name]  . Hypothyroidism   . Anemia   . Breast cancer, stage 1 03/26/2011    left-FINISHED CHEMO  AND RADIATION  . Arthritis     RIGHT KNEE ARTHRITIS AND PAIN-PT TOLD BONE ON BONE  . Depression   . Back pain     DISK PROBLEM  . Complication of anesthesia     PT STATES HER B/P  LOW AFTER ONE OF HER SURGERIES--SHE ATTRIBUTES TO LYING FLAT  . Chronic systolic heart failure     a) NICM b) ECHO (03/2013) EF 20-25% c) ECHO (09/2013) EF 45-50%, grade I DD  . Left bundle branch block     s/p CRT-D (04/2013)  . Gout   . SVD (spontaneous vaginal delivery)     x 2  . Hyperlipidemia   . Anxiety   . Exertional shortness of breath   . Diabetes mellitus     "diet controlled" (05/04/2103)  . Peripheral neuropathy     feet  . GERD (gastroesophageal reflux disease)   . Migraines   . On home oxygen therapy     "2L suppose to be q night" (05/03/2013)    Current Outpatient Prescriptions  Medication Sig Dispense Refill  . albuterol (PROVENTIL HFA;VENTOLIN HFA) 108 (90 BASE) MCG/ACT inhaler Inhale 2 puffs into the lungs every 6 (six) hours as needed (ON HOLD). Wheezing    . albuterol (PROVENTIL) (2.5 MG/3ML) 0.083% nebulizer solution Take 2.5 mg by nebulization every 6 (six) hours as needed. Wheezing     . ARIPiprazole (ABILIFY) 2 MG tablet Take 2 mg by mouth every morning.     Marland Kitchen buPROPion (WELLBUTRIN XL) 150 MG 24 hr tablet Take 300 mg by mouth every morning.     Marland Kitchen  carvedilol (COREG) 6.25 MG tablet Take 1 tablet in the am and 2 tablets in the pm 90 tablet 3  . clonazePAM (KLONOPIN) 0.5 MG tablet Take 0.5 mg by mouth 2 (two) times daily as needed for anxiety. 1 in morning, 1 at bedtime    . digoxin (LANOXIN) 0.125 MG tablet Take 0.5 tablets (0.0625 mg total) by mouth daily. 15 tablet 6  . febuxostat (ULORIC) 40 MG tablet Take 40 mg by mouth at bedtime.     . ferrous sulfate 325 (65 FE) MG tablet Take 325 mg by mouth 2 (two) times daily with a meal.     . gabapentin (NEURONTIN) 300 MG capsule Take 300 mg by mouth 3 (three) times daily.     Marland Kitchen levothyroxine (SYNTHROID, LEVOTHROID) 125 MCG tablet Take 125 mcg by mouth every morning. On an empty stomach    . lisinopril (ZESTRIL) 2.5 MG tablet Take 1 tablet (2.5 mg total) by mouth at bedtime. 30 tablet 6  . metolazone (ZAROXOLYN)  2.5 MG tablet Take 2.5 mg by mouth as needed.    . polyethylene glycol (MIRALAX / GLYCOLAX) packet Take 17 g by mouth 2 (two) times daily. 14 each   . potassium chloride SA (K-DUR,KLOR-CON) 20 MEQ tablet Take 2 tabs in AM and 1 tab in PM 90 tablet 6  . simvastatin (ZOCOR) 10 MG tablet Take 10 mg by mouth every evening.     Marland Kitchen spironolactone (ALDACTONE) 25 MG tablet Take 1 tablet (25 mg total) by mouth daily. 30 tablet 3  . torsemide (DEMADEX) 20 MG tablet Take 2 tablets in am and 1 tablet in pm 90 tablet 3  . zolpidem (AMBIEN) 10 MG tablet Take 10 mg by mouth at bedtime as needed. For sleep     No current facility-administered medications for this encounter.    Filed Vitals:   07/10/15 1409 07/10/15 1410  BP: 106/70 106/70  Pulse: 79 79  Weight: 247 lb (112.038 kg) 247 lb (112.038 kg)  SpO2: 99% 99%    PHYSICAL EXAM: General:  No resp difficulty; Husband present HEENT: normal except for cleft palate repair Neck: supple. JVP difficult to asses due to body habitus but does appear elevated; carotids 2+ bilaterally; no bruits. No lymphadenopathy or thryomegaly appreciated. Cor: PMI nonpalpable. Regular rate & rhythm. Paradoxically split S2.  No rubs, gallops or murmurs. Lungs: CTA  Abdomen: morbidly obese, soft, nontender, nondistended. No hepatosplenomegaly. No bruits or masses. Good bowel sounds. Extremities: no cyanosis, clubbing, rash, trace bilateral edema Neuro: alert & orientedx3, cranial nerves grossly intact. Moves all 4 extremities w/o difficulty.    ASSESSMENT & PLAN:  1) Chronic systolic HF: NICM s/p CRT-D, ?Viral myocarditis, cardiomyopathy dates from 1999. EF 45-50% (09/2013). - NYHA II symptoms and volume ok. Continue torsemide 40 mg in am and 20 mg in pm. I have asked her to take an additional 20 mg torsemide for 3 pound weight gain in 24 hours.  Continue KDUR 40 meq q am and 20 meq q pm. - Optivol interrogated and fluid below threshold but trending up,  no Afib, and  activity 1 hr a day.   - Continue coreg 3.125 mg q am and 6.25 mg q pm.   - Continue lisinopril 2.5 mg daily -Continue 25 mg spironolactone daily.    - . BMET today Stop digoxin.  - Reinforced the need and importance of daily weights, a low sodium diet, and fluid restriction (less than 2 L a day). Instructed to call the HF  clinic if weight increases more than 3 lbs overnight or 5 lbs in a week.  2) Obesity - Needs to lose weight. Encouraged to cut back portions.  I have asked her to start walking 15 minutes a day.  3) HTN- Stable. Continue current regimen. No change in medications.    Follow up in 4 months.  Rhonda Emerick NP-C 2:12 PM

## 2015-07-16 DIAGNOSIS — M40202 Unspecified kyphosis, cervical region: Secondary | ICD-10-CM | POA: Diagnosis not present

## 2015-07-16 DIAGNOSIS — M542 Cervicalgia: Secondary | ICD-10-CM | POA: Diagnosis not present

## 2015-07-16 DIAGNOSIS — M546 Pain in thoracic spine: Secondary | ICD-10-CM | POA: Diagnosis not present

## 2015-07-22 DIAGNOSIS — M542 Cervicalgia: Secondary | ICD-10-CM | POA: Diagnosis not present

## 2015-07-22 DIAGNOSIS — M546 Pain in thoracic spine: Secondary | ICD-10-CM | POA: Diagnosis not present

## 2015-07-22 DIAGNOSIS — M40202 Unspecified kyphosis, cervical region: Secondary | ICD-10-CM | POA: Diagnosis not present

## 2015-07-24 ENCOUNTER — Encounter: Payer: Self-pay | Admitting: *Deleted

## 2015-07-25 ENCOUNTER — Telehealth (HOSPITAL_COMMUNITY): Payer: Self-pay | Admitting: *Deleted

## 2015-07-25 NOTE — Telephone Encounter (Signed)
Pt called for her echo results, provided pt with results

## 2015-07-28 DIAGNOSIS — J449 Chronic obstructive pulmonary disease, unspecified: Secondary | ICD-10-CM | POA: Diagnosis not present

## 2015-07-29 DIAGNOSIS — M40202 Unspecified kyphosis, cervical region: Secondary | ICD-10-CM | POA: Diagnosis not present

## 2015-07-29 DIAGNOSIS — M546 Pain in thoracic spine: Secondary | ICD-10-CM | POA: Diagnosis not present

## 2015-07-29 DIAGNOSIS — M542 Cervicalgia: Secondary | ICD-10-CM | POA: Diagnosis not present

## 2015-07-31 ENCOUNTER — Telehealth (HOSPITAL_COMMUNITY): Payer: Self-pay | Admitting: *Deleted

## 2015-07-31 NOTE — Telephone Encounter (Signed)
Pt left voice message asking if she can take a supplement Garcinia Cabogia.... She wants to know if it will interact with her current medications please advise

## 2015-07-31 NOTE — Telephone Encounter (Signed)
Returned patient call about Garcinia Cambogia for weight loss. Instructed pt that Garcinia can interact with her serotonin medications including Wellbutrin and that it also contains potassium.  Recommended against patient starting the supplement.  Pt asked if using vinegar with some honey would be okay to try for weight loss.  Instructed pt that this would probably be okay as long as it was low volume.

## 2015-08-05 ENCOUNTER — Other Ambulatory Visit (HOSPITAL_COMMUNITY): Payer: Self-pay | Admitting: *Deleted

## 2015-08-05 ENCOUNTER — Other Ambulatory Visit (HOSPITAL_COMMUNITY): Payer: Self-pay | Admitting: Psychiatry

## 2015-08-05 MED ORDER — LISINOPRIL 2.5 MG PO TABS: 2.5000 mg | ORAL_TABLET | Freq: Every day | ORAL | Status: DC

## 2015-08-28 DIAGNOSIS — J449 Chronic obstructive pulmonary disease, unspecified: Secondary | ICD-10-CM | POA: Diagnosis not present

## 2015-08-30 DIAGNOSIS — L304 Erythema intertrigo: Secondary | ICD-10-CM | POA: Diagnosis not present

## 2015-08-30 DIAGNOSIS — B351 Tinea unguium: Secondary | ICD-10-CM | POA: Diagnosis not present

## 2015-09-04 ENCOUNTER — Other Ambulatory Visit (HOSPITAL_COMMUNITY): Payer: Self-pay | Admitting: Psychiatry

## 2015-09-17 DIAGNOSIS — I10 Essential (primary) hypertension: Secondary | ICD-10-CM | POA: Diagnosis not present

## 2015-09-17 DIAGNOSIS — E785 Hyperlipidemia, unspecified: Secondary | ICD-10-CM | POA: Diagnosis not present

## 2015-09-17 DIAGNOSIS — Z0000 Encounter for general adult medical examination without abnormal findings: Secondary | ICD-10-CM | POA: Diagnosis not present

## 2015-09-17 DIAGNOSIS — E039 Hypothyroidism, unspecified: Secondary | ICD-10-CM | POA: Diagnosis not present

## 2015-09-17 DIAGNOSIS — I5022 Chronic systolic (congestive) heart failure: Secondary | ICD-10-CM | POA: Diagnosis not present

## 2015-09-17 DIAGNOSIS — Z6841 Body Mass Index (BMI) 40.0 and over, adult: Secondary | ICD-10-CM | POA: Diagnosis not present

## 2015-09-17 DIAGNOSIS — J45909 Unspecified asthma, uncomplicated: Secondary | ICD-10-CM | POA: Diagnosis not present

## 2015-09-17 DIAGNOSIS — E559 Vitamin D deficiency, unspecified: Secondary | ICD-10-CM | POA: Diagnosis not present

## 2015-09-17 DIAGNOSIS — E1140 Type 2 diabetes mellitus with diabetic neuropathy, unspecified: Secondary | ICD-10-CM | POA: Diagnosis not present

## 2015-09-17 DIAGNOSIS — F330 Major depressive disorder, recurrent, mild: Secondary | ICD-10-CM | POA: Diagnosis not present

## 2015-10-03 DIAGNOSIS — L84 Corns and callosities: Secondary | ICD-10-CM | POA: Diagnosis not present

## 2015-10-03 DIAGNOSIS — M216X2 Other acquired deformities of left foot: Secondary | ICD-10-CM | POA: Diagnosis not present

## 2015-10-03 DIAGNOSIS — E119 Type 2 diabetes mellitus without complications: Secondary | ICD-10-CM | POA: Diagnosis not present

## 2015-10-03 DIAGNOSIS — M216X1 Other acquired deformities of right foot: Secondary | ICD-10-CM | POA: Diagnosis not present

## 2015-10-03 DIAGNOSIS — L602 Onychogryphosis: Secondary | ICD-10-CM | POA: Diagnosis not present

## 2015-10-04 ENCOUNTER — Other Ambulatory Visit (HOSPITAL_COMMUNITY): Payer: Self-pay | Admitting: Psychiatry

## 2015-10-04 ENCOUNTER — Other Ambulatory Visit (HOSPITAL_COMMUNITY): Payer: Self-pay | Admitting: *Deleted

## 2015-10-04 ENCOUNTER — Other Ambulatory Visit: Payer: Self-pay

## 2015-10-04 MED ORDER — METOLAZONE 2.5 MG PO TABS: 2.5000 mg | ORAL_TABLET | ORAL | Status: DC | PRN

## 2015-10-16 ENCOUNTER — Other Ambulatory Visit (HOSPITAL_COMMUNITY): Payer: Self-pay | Admitting: *Deleted

## 2015-10-16 DIAGNOSIS — N183 Chronic kidney disease, stage 3 (moderate): Secondary | ICD-10-CM | POA: Diagnosis not present

## 2015-10-16 MED ORDER — SPIRONOLACTONE 25 MG PO TABS: 25.0000 mg | ORAL_TABLET | Freq: Every day | ORAL | Status: DC

## 2015-10-18 DIAGNOSIS — M542 Cervicalgia: Secondary | ICD-10-CM | POA: Diagnosis not present

## 2015-10-31 DIAGNOSIS — Z853 Personal history of malignant neoplasm of breast: Secondary | ICD-10-CM | POA: Diagnosis not present

## 2015-11-01 ENCOUNTER — Other Ambulatory Visit: Payer: Self-pay

## 2015-11-01 ENCOUNTER — Other Ambulatory Visit: Payer: Self-pay | Admitting: Orthopedic Surgery

## 2015-11-01 DIAGNOSIS — M542 Cervicalgia: Secondary | ICD-10-CM

## 2015-11-03 ENCOUNTER — Other Ambulatory Visit (HOSPITAL_COMMUNITY): Payer: Self-pay | Admitting: Psychiatry

## 2015-11-04 ENCOUNTER — Other Ambulatory Visit (HOSPITAL_COMMUNITY): Payer: Self-pay | Admitting: *Deleted

## 2015-11-04 DIAGNOSIS — I509 Heart failure, unspecified: Secondary | ICD-10-CM

## 2015-11-04 MED ORDER — CARVEDILOL 6.25 MG PO TABS: ORAL_TABLET | ORAL | Status: DC

## 2015-11-05 ENCOUNTER — Other Ambulatory Visit (HOSPITAL_COMMUNITY): Payer: Self-pay | Admitting: *Deleted

## 2015-11-05 ENCOUNTER — Telehealth: Payer: Self-pay | Admitting: Internal Medicine

## 2015-11-05 ENCOUNTER — Other Ambulatory Visit (HOSPITAL_COMMUNITY): Payer: Self-pay | Admitting: Psychiatry

## 2015-11-05 MED ORDER — METOLAZONE 2.5 MG PO TABS: 2.5000 mg | ORAL_TABLET | ORAL | Status: DC | PRN

## 2015-11-05 NOTE — Telephone Encounter (Signed)
New message      Pt c/o medication issue:  1. Name of Medication: Metolazone  2. How are you currently taking this medication (dosage and times per day)? 2.5 mg po as needed  3. Are you having a reaction (difficulty breathing--STAT)? no  4. What is your medication issue? The MD did not write a frequency

## 2015-11-18 ENCOUNTER — Other Ambulatory Visit (HOSPITAL_COMMUNITY): Payer: Self-pay | Admitting: Psychiatry

## 2015-11-26 ENCOUNTER — Other Ambulatory Visit (HOSPITAL_COMMUNITY): Payer: Self-pay | Admitting: Psychiatry

## 2015-11-27 ENCOUNTER — Other Ambulatory Visit (HOSPITAL_COMMUNITY): Payer: Self-pay | Admitting: Psychiatry

## 2015-11-29 ENCOUNTER — Other Ambulatory Visit (HOSPITAL_COMMUNITY): Payer: Self-pay | Admitting: Psychiatry

## 2015-12-03 ENCOUNTER — Other Ambulatory Visit (HOSPITAL_COMMUNITY): Payer: Self-pay | Admitting: Psychiatry

## 2015-12-05 DIAGNOSIS — F0632 Mood disorder due to known physiological condition with major depressive-like episode: Secondary | ICD-10-CM | POA: Diagnosis not present

## 2015-12-10 ENCOUNTER — Other Ambulatory Visit (HOSPITAL_COMMUNITY): Payer: Self-pay | Admitting: *Deleted

## 2015-12-10 NOTE — Telephone Encounter (Signed)
This was addressed by other mean per pharmacy

## 2015-12-11 ENCOUNTER — Ambulatory Visit (INDEPENDENT_AMBULATORY_CARE_PROVIDER_SITE_OTHER): Payer: Commercial Managed Care - HMO | Admitting: *Deleted

## 2015-12-11 DIAGNOSIS — I428 Other cardiomyopathies: Secondary | ICD-10-CM

## 2015-12-11 DIAGNOSIS — I429 Cardiomyopathy, unspecified: Secondary | ICD-10-CM

## 2015-12-11 DIAGNOSIS — I5022 Chronic systolic (congestive) heart failure: Secondary | ICD-10-CM | POA: Diagnosis not present

## 2015-12-11 DIAGNOSIS — Z95810 Presence of automatic (implantable) cardiac defibrillator: Secondary | ICD-10-CM | POA: Diagnosis not present

## 2015-12-13 ENCOUNTER — Encounter: Payer: Self-pay | Admitting: Cardiology

## 2015-12-13 NOTE — Progress Notes (Signed)
Remote ICD transmission.   

## 2015-12-19 ENCOUNTER — Other Ambulatory Visit (HOSPITAL_COMMUNITY): Payer: Self-pay | Admitting: Internal Medicine

## 2015-12-24 ENCOUNTER — Encounter: Payer: Self-pay | Admitting: Internal Medicine

## 2015-12-24 LAB — CUP PACEART REMOTE DEVICE CHECK
Battery Remaining Longevity: 56 mo
Battery Voltage: 2.97 V
Brady Statistic AP VP Percent: 0.03 %
Brady Statistic AP VS Percent: 0.01 %
Brady Statistic AS VP Percent: 99.93 %
Brady Statistic AS VS Percent: 0.04 %
Brady Statistic RA Percent Paced: 0.04 %
Brady Statistic RV Percent Paced: 80.48 %
Date Time Interrogation Session: 20170728043158
HighPow Impedance: 82 Ohm
Implantable Lead Implant Date: 20141210
Implantable Lead Implant Date: 20141210
Implantable Lead Implant Date: 20141210
Implantable Lead Location: 753858
Implantable Lead Location: 753859
Implantable Lead Location: 753860
Implantable Lead Model: 4396
Implantable Lead Model: 5076
Implantable Lead Model: 6935
Lead Channel Impedance Value: 380 Ohm
Lead Channel Impedance Value: 380 Ohm
Lead Channel Impedance Value: 494 Ohm
Lead Channel Impedance Value: 570 Ohm
Lead Channel Impedance Value: 627 Ohm
Lead Channel Impedance Value: 703 Ohm
Lead Channel Pacing Threshold Amplitude: 0.625 V
Lead Channel Pacing Threshold Amplitude: 0.875 V
Lead Channel Pacing Threshold Amplitude: 1.25 V
Lead Channel Pacing Threshold Pulse Width: 0.4 ms
Lead Channel Pacing Threshold Pulse Width: 0.4 ms
Lead Channel Pacing Threshold Pulse Width: 0.4 ms
Lead Channel Sensing Intrinsic Amplitude: 28.625 mV
Lead Channel Sensing Intrinsic Amplitude: 28.625 mV
Lead Channel Sensing Intrinsic Amplitude: 3.375 mV
Lead Channel Sensing Intrinsic Amplitude: 3.375 mV
Lead Channel Setting Pacing Amplitude: 2 V
Lead Channel Setting Pacing Amplitude: 2.5 V
Lead Channel Setting Pacing Amplitude: 2.5 V
Lead Channel Setting Pacing Pulse Width: 0.4 ms
Lead Channel Setting Pacing Pulse Width: 0.4 ms
Lead Channel Setting Sensing Sensitivity: 0.3 mV

## 2016-01-01 ENCOUNTER — Other Ambulatory Visit (HOSPITAL_COMMUNITY): Payer: Self-pay | Admitting: *Deleted

## 2016-01-01 ENCOUNTER — Telehealth (HOSPITAL_COMMUNITY): Payer: Self-pay | Admitting: Vascular Surgery

## 2016-01-01 MED ORDER — POTASSIUM CHLORIDE CRYS ER 20 MEQ PO TBCR: EXTENDED_RELEASE_TABLET | ORAL | 6 refills | Status: DC

## 2016-01-01 MED ORDER — TORSEMIDE 20 MG PO TABS: ORAL_TABLET | ORAL | 3 refills | Status: DC

## 2016-01-01 NOTE — Telephone Encounter (Signed)
Pt needs need refill torsemide or potassium pt states she ran out of medication because dosage was increased

## 2016-01-02 ENCOUNTER — Other Ambulatory Visit (HOSPITAL_COMMUNITY): Payer: Self-pay | Admitting: Internal Medicine

## 2016-01-02 MED ORDER — POTASSIUM CHLORIDE CRYS ER 20 MEQ PO TBCR: EXTENDED_RELEASE_TABLET | ORAL | 6 refills | Status: DC

## 2016-01-09 DIAGNOSIS — Z124 Encounter for screening for malignant neoplasm of cervix: Secondary | ICD-10-CM | POA: Diagnosis not present

## 2016-01-09 DIAGNOSIS — Z6841 Body Mass Index (BMI) 40.0 and over, adult: Secondary | ICD-10-CM | POA: Diagnosis not present

## 2016-01-09 DIAGNOSIS — Z01419 Encounter for gynecological examination (general) (routine) without abnormal findings: Secondary | ICD-10-CM | POA: Diagnosis not present

## 2016-02-06 ENCOUNTER — Telehealth (HOSPITAL_COMMUNITY): Payer: Self-pay | Admitting: Pharmacist

## 2016-02-06 NOTE — Telephone Encounter (Signed)
Rhonda [name] stated that she thought [name] increased her dose of torsemide to 40 mg BID instead of 40 mg/20 mg and her KCl to 40 meq BID instead of 40 meq/20 meq. Spoke with Amy who hasn't seen Rhonda [name] since February but per her notes then she should continue the torsemide at 40 mg/20 mg and KCl 40 meq/20 meq. I have scheduled a f/u appointment with Amy in 2 weeks but have asked her to continue her current regimen and call us if her weight goes up, she starts feeling more short of breath, or she has edema. Patient verbalized understanding.  [name]. [name], PharmD, BCPS, CPP Clinical Pharmacist Pager: [phone] Phone: [phone] 02/06/2016 1:29 PM

## 2016-02-12 DIAGNOSIS — Z23 Encounter for immunization: Secondary | ICD-10-CM | POA: Diagnosis not present

## 2016-02-17 NOTE — Progress Notes (Signed)
Referred to ICM clinic by Debroah Loop, device RN/Dr Lovena Le.  Call to patient and provided ICM intro and explained program.  She agreed to [facility] monthly follow up.  Patient gave verbal permission to speak with husband and to leave detailed message on phone.  She has appointment with Dr Haroldine Laws on 02/19/2016.   1st ICM remote transmission scheduled for 03/16/2016.  Provided ICM phone number and encouraged to call for fluid symptoms.  Discussed making an appointment with Dr Lovena Le since she has not seen him in the office since 05/2014.  She agreed and will have scheduler call and set up an appointment.

## 2016-02-19 ENCOUNTER — Ambulatory Visit (HOSPITAL_COMMUNITY)
Admission: RE | Admit: 2016-02-19 | Discharge: 2016-02-19 | Disposition: A | Discharge status: Home / Self Care | Payer: Commercial Managed Care - HMO | Source: Ambulatory Visit | Attending: Internal Medicine | Admitting: Internal Medicine

## 2016-02-19 ENCOUNTER — Encounter: Payer: Self-pay | Admitting: Internal Medicine

## 2016-02-19 VITALS — BP 144/86 | HR 91 | Wt 255.6 lb

## 2016-02-19 DIAGNOSIS — J45909 Unspecified asthma, uncomplicated: Secondary | ICD-10-CM | POA: Diagnosis not present

## 2016-02-19 DIAGNOSIS — Z9221 Personal history of antineoplastic chemotherapy: Secondary | ICD-10-CM | POA: Diagnosis not present

## 2016-02-19 DIAGNOSIS — I5022 Chronic systolic (congestive) heart failure: Secondary | ICD-10-CM | POA: Diagnosis not present

## 2016-02-19 DIAGNOSIS — I110 Hypertensive heart disease with heart failure: Secondary | ICD-10-CM | POA: Diagnosis not present

## 2016-02-19 DIAGNOSIS — I429 Cardiomyopathy, unspecified: Secondary | ICD-10-CM | POA: Insufficient documentation

## 2016-02-19 DIAGNOSIS — Z853 Personal history of malignant neoplasm of breast: Secondary | ICD-10-CM | POA: Insufficient documentation

## 2016-02-19 DIAGNOSIS — K219 Gastro-esophageal reflux disease without esophagitis: Secondary | ICD-10-CM | POA: Diagnosis not present

## 2016-02-19 DIAGNOSIS — F329 Major depressive disorder, single episode, unspecified: Secondary | ICD-10-CM | POA: Diagnosis not present

## 2016-02-19 DIAGNOSIS — I428 Other cardiomyopathies: Secondary | ICD-10-CM

## 2016-02-19 DIAGNOSIS — E039 Hypothyroidism, unspecified: Secondary | ICD-10-CM | POA: Diagnosis not present

## 2016-02-19 DIAGNOSIS — F419 Anxiety disorder, unspecified: Secondary | ICD-10-CM | POA: Diagnosis not present

## 2016-02-19 DIAGNOSIS — E119 Type 2 diabetes mellitus without complications: Secondary | ICD-10-CM | POA: Diagnosis not present

## 2016-02-19 DIAGNOSIS — G629 Polyneuropathy, unspecified: Secondary | ICD-10-CM | POA: Diagnosis not present

## 2016-02-19 DIAGNOSIS — E785 Hyperlipidemia, unspecified: Secondary | ICD-10-CM | POA: Insufficient documentation

## 2016-02-19 DIAGNOSIS — M109 Gout, unspecified: Secondary | ICD-10-CM | POA: Diagnosis not present

## 2016-02-19 DIAGNOSIS — I447 Left bundle-branch block, unspecified: Secondary | ICD-10-CM | POA: Insufficient documentation

## 2016-02-19 DIAGNOSIS — E559 Vitamin D deficiency, unspecified: Secondary | ICD-10-CM | POA: Insufficient documentation

## 2016-02-19 DIAGNOSIS — Z923 Personal history of irradiation: Secondary | ICD-10-CM | POA: Diagnosis not present

## 2016-02-19 DIAGNOSIS — Z9889 Other specified postprocedural states: Secondary | ICD-10-CM | POA: Insufficient documentation

## 2016-02-19 DIAGNOSIS — G43909 Migraine, unspecified, not intractable, without status migrainosus: Secondary | ICD-10-CM | POA: Insufficient documentation

## 2016-02-19 DIAGNOSIS — Z9981 Dependence on supplemental oxygen: Secondary | ICD-10-CM | POA: Diagnosis not present

## 2016-02-19 LAB — BASIC METABOLIC PANEL
Anion gap: 10 (ref 5–15)
BUN: 44 mg/dL — ABNORMAL HIGH (ref 6–20)
CO2: 29 mmol/L (ref 22–32)
Calcium: 9.4 mg/dL (ref 8.9–10.3)
Chloride: 97 mmol/L — ABNORMAL LOW (ref 101–111)
Creatinine, Ser: 1.77 mg/dL — ABNORMAL HIGH (ref 0.44–1.00)
GFR calc Af Amer: 34 mL/min — ABNORMAL LOW (ref >60)
GFR calc non Af Amer: 29 mL/min — ABNORMAL LOW (ref >60)
Glucose, Bld: 82 mg/dL (ref 65–99)
Potassium: 4.1 mmol/L (ref 3.5–5.1)
Sodium: 136 mmol/L (ref 135–145)

## 2016-02-19 NOTE — Patient Instructions (Signed)
Labs today We will only contact you if something comes back abnormal or we need to make some changes. Otherwise no news is good news!  Your physician has requested that you have an echocardiogram. Echocardiography is a painless test that uses sound waves to create images of your heart. It provides your doctor with information about the size and shape of your heart and how well your heart's chambers and valves are working. This procedure takes approximately one hour. There are no restrictions for this procedure.  Your physician recommends that you schedule a follow-up appointment in: 6 months with Dr Aundra Dubin  Do the following things EVERYDAY: 1) Weigh yourself in the morning before breakfast. Write it down and keep it in a log. 2) Take your medicines as prescribed 3) Eat low salt foods-Limit salt (sodium) to 2000 mg per day.  4) Stay as active as you can everyday 5) Limit all fluids for the day to less than 2 liters

## 2016-02-19 NOTE — Progress Notes (Signed)
Advanced Heart Failure Medication Review by a Pharmacist  Does the patient  feel that his/her medications are working for him/her?  yes  Has the patient been experiencing any side effects to the medications prescribed?  no  Does the patient measure his/her own blood pressure or blood glucose at home?  yes   Does the patient have any problems obtaining medications due to transportation or finances?   no  Understanding of regimen: fair Understanding of indications: fair Potential of compliance: good Patient understands to avoid NSAIDs. Patient understands to avoid decongestants.  Issues to address at subsequent visits: None   Pharmacist comments:  Rhonda [name] is a 65 yo F presenting with her husband and her medication bottles. She reports good compliance with her regimen but I did notice that she has an Rx for ibuprofen 800 mg tablets. We discussed the CV risks associated with this class of medications and recommended APAP instead. She did not have any other medication-related questions or concerns for me at this time.   [name]. [name], PharmD, BCPS, CPP Clinical Pharmacist Pager: [phone] Phone: [phone] 02/19/2016 3:45 PM      Time with patient: 10 minutes Preparation and documentation time: 2 minutes Total time: 12 minutes

## 2016-02-19 NOTE — Progress Notes (Signed)
Patient ID: Rhonda [name], female   DOB: 12/20/1950, 65 y.o.   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 She has had sleep study with no  evidence of sleep apnea.   Follow up for Heart Failure:  Overall feeling ok. Denies SOB/Orthopnea/PND. Weight at home trending up. Tries to follow low salt diet.  She is attending the [facility] 2 days a week. Taking all medications.   10/14/12 ECHO EF 35% RV ok  04/12/13 ECHO EF 20-25%, LV moderately dilated 10/11/2013 ECHO EF 45-50%  11/24/12 Creatinine 1.23 Potassium 4.1 12/15/12 Creatinine 2.03 Potassium 3.3  12/22/12 Creatinine 1.2 Potassium 3.8 02/23/13 Creatinine 1.68 K 4.4  05/02/13 K 4.2 Creatinine 1.8  10/11/13 K 3.7 Creatinine 1.69  ROS: All systems negative ex cept as listed in HPI, PMH and Problem List.  Past Medical History:  Diagnosis Date  . Anemia   . Anxiety   . Arthritis    RIGHT KNEE ARTHRITIS AND PAIN-PT TOLD BONE ON BONE  . Asthma   . Back pain    DISK PROBLEM  . Breast cancer, stage 1 03/26/2011   left-FINISHED CHEMO  AND RADIATION  . Cardiomyopathy    [name]  . Chronic systolic heart failure    a) NICM b) ECHO (03/2013) EF 20-25% c) ECHO (09/2013) EF 45-50%, grade I DD  . Complication of anesthesia    PT STATES HER B/P LOW AFTER ONE OF HER SURGERIES--SHE ATTRIBUTES TO LYING FLAT  . Depression   . Diabetes mellitus    "diet controlled" (05/04/2103)  . Exertional shortness of breath   . GERD  (gastroesophageal reflux disease)   . Gout   . Heart murmur   . Hyperlipidemia   . Hypertension   . Hypothyroidism   . Left bundle branch block    s/p CRT-D (04/2013)  . Migraines   . On home oxygen therapy    "2L suppose to be q night" (05/03/2013)  . Peripheral neuropathy    feet  . SVD (spontaneous vaginal delivery)    x 2  . Unspecified vitamin D deficiency 03/26/2011   does not take meds    Current Outpatient Prescriptions  Medication Sig Dispense Refill  . albuterol (PROVENTIL HFA;VENTOLIN HFA) 108 (90 BASE) MCG/ACT inhaler Inhale 2 puffs into the lungs every 6 (six) hours as needed (ON HOLD). Wheezing    . albuterol (PROVENTIL) (2.5 MG/3ML) 0.083% nebulizer solution Take 2.5 mg by nebulization every 6 (six) hours as needed. Wheezing     . ARIPiprazole (ABILIFY) 2 MG tablet Take 2 mg by mouth every morning.     Marland Kitchen buPROPion (WELLBUTRIN XL) 150 MG 24 hr tablet Take 450 mg by mouth every morning.     . carvedilol (COREG) 6.25 MG tablet Take 1 tablet in the am and 2 tablets in the pm 90 tablet 3  . clonazePAM (KLONOPIN) 0.5 MG tablet Take 0.5 mg by mouth 2 (two) times daily as needed for anxiety. 1 in morning, 1 at  bedtime    . cyclobenzaprine (FLEXERIL) 10 MG tablet Take 10 mg by mouth 3 (three) times daily as needed for muscle spasms.    . febuxostat (ULORIC) 40 MG tablet Take 40 mg by mouth at bedtime.     . gabapentin (NEURONTIN) 300 MG capsule Take 300 mg by mouth 3 (three) times daily.     Marland Kitchen ibuprofen (ADVIL,MOTRIN) 800 MG tablet Take 800 mg by mouth 3 (three) times daily as needed for mild pain.    Marland Kitchen levothyroxine (SYNTHROID, LEVOTHROID) 125 MCG tablet Take 125 mcg by mouth every morning. On an empty stomach    . lisinopril (ZESTRIL) 2.5 MG tablet Take 1 tablet (2.5 mg total) by mouth at bedtime. 30 tablet 6  . meloxicam (MOBIC) 15 MG tablet Take 15 mg by mouth daily as needed for pain.    . methocarbamol (ROBAXIN) 500 MG tablet Take 500 mg by mouth every 6 (six) hours as  needed for muscle spasms.    . montelukast (SINGULAIR) 10 MG tablet Take 10 mg by mouth at bedtime.    . potassium chloride SA (K-DUR,KLOR-CON) 20 MEQ tablet Take 2 tabs in AM and 1 tab in PM 90 tablet 6  . simvastatin (ZOCOR) 10 MG tablet Take 10 mg by mouth every evening.     Marland Kitchen spironolactone (ALDACTONE) 25 MG tablet Take 1 tablet (25 mg total) by mouth daily. 30 tablet 3  . torsemide (DEMADEX) 20 MG tablet take 2 tablets by mouth IN THE MORNING AND 1 TAB IN THE EVENING 90 tablet 3  . traMADol (ULTRAM) 50 MG tablet Take 50 mg by mouth every 6 (six) hours as needed for moderate pain.    Marland Kitchen zolpidem (AMBIEN) 10 MG tablet Take 10 mg by mouth at bedtime as needed. For sleep     No current facility-administered medications for this encounter.     Vitals:   02/19/16 1527  BP: (!) 144/86  BP Location: Left Arm  Patient Position: Sitting  Pulse: 91  SpO2: 100%  Weight: 255 lb 9.6 oz (115.9 kg)    PHYSICAL EXAM: General:  No resp difficulty; Husband present. Ambualted in the clinic without difficulty.  HEENT: normal except for cleft palate repair Neck: supple. JVP difficult to asses due to body habitus but does appear elevated; carotids 2+ bilaterally; no bruits. No lymphadenopathy or thryomegaly appreciated. Cor: PMI nonpalpable. Regular rate & rhythm. Paradoxically split S2.  No rubs, gallops or murmurs. Lungs: CTA  Abdomen: morbidly obese, soft, nontender, nondistended. No hepatosplenomegaly. No bruits or masses. Good bowel sounds. Extremities: no cyanosis, clubbing, rash, trace bilateral edema Neuro: alert & orientedx3, cranial nerves grossly intact. Moves all 4 extremities w/o difficulty.    ASSESSMENT & PLAN:  1) Chronic systolic HF: NICM s/p CRT-D, ?Viral myocarditis, cardiomyopathy dates from 1999. EF 45-50% (09/2013). - NYHA II symptoms  Volume status stable. Continue torsemide 40 mg in am and 20 mg in pm. urs.  Continue KDUR 40 meq q am and 20 meq q pm. - Optivol interrogated  and fluid below threshold but trending up,  no Afib, and activity trending up about 2 hours.a day.    - Continue coreg 3.125 mg q am and 6.25 mg q pm.   - Continue lisinopril 2.5 mg daily -Continue 25 mg spironolactone daily.    - . BMET today.  - Reinforced the need and importance of daily weights, a low sodium diet, and fluid restriction (less than 2 L a day). Instructed to call the  HF clinic if weight increases more than 3 lbs overnight or 5 lbs in a week.  2) Obesity - Needs to lose weight.  3) HTN- Stable. Continue current regimen. No change in medications.   BMEt today.  Follow up 6 months with ECHO and Dr Donnamae Jude NP-C 3:45 PM

## 2016-02-21 ENCOUNTER — Telehealth (HOSPITAL_COMMUNITY): Payer: Self-pay | Admitting: Cardiology

## 2016-02-21 DIAGNOSIS — I509 Heart failure, unspecified: Secondary | ICD-10-CM

## 2016-02-21 MED ORDER — POTASSIUM CHLORIDE CRYS ER 20 MEQ PO TBCR: 20.0000 meq | EXTENDED_RELEASE_TABLET | Freq: Every day | ORAL | 6 refills | Status: DC

## 2016-02-21 MED ORDER — TORSEMIDE 20 MG PO TABS: 40.0000 mg | ORAL_TABLET | Freq: Every day | ORAL | 3 refills | Status: DC

## 2016-02-21 NOTE — Telephone Encounter (Signed)
-----   Message from Conrad Elmo, NP sent at 02/21/2016  4:38 PM EDT ----- Please call and ask her to hold lasix and potassium for 2 days then start lasix 40 mg daily + 20 meq potassium. Repeat BMEt next week.

## 2016-02-26 ENCOUNTER — Telehealth (HOSPITAL_COMMUNITY): Payer: Self-pay | Admitting: Vascular Surgery

## 2016-02-26 NOTE — Telephone Encounter (Signed)
Pt called she to someone to call her to clarify how to take her potassium and torsemide.. Please advise

## 2016-02-27 ENCOUNTER — Encounter: Payer: Self-pay | Admitting: Internal Medicine

## 2016-03-02 ENCOUNTER — Other Ambulatory Visit (HOSPITAL_COMMUNITY): Payer: Self-pay | Admitting: Internal Medicine

## 2016-03-02 ENCOUNTER — Ambulatory Visit (HOSPITAL_COMMUNITY)
Admission: RE | Admit: 2016-03-02 | Discharge: 2016-03-02 | Disposition: A | Discharge status: Home / Self Care | Payer: Commercial Managed Care - HMO | Source: Ambulatory Visit | Attending: Internal Medicine | Admitting: Internal Medicine

## 2016-03-02 DIAGNOSIS — I509 Heart failure, unspecified: Secondary | ICD-10-CM | POA: Diagnosis not present

## 2016-03-02 LAB — BASIC METABOLIC PANEL
Anion gap: 10 (ref 5–15)
BUN: 44 mg/dL — ABNORMAL HIGH (ref 6–20)
CO2: 30 mmol/L (ref 22–32)
Calcium: 9.3 mg/dL (ref 8.9–10.3)
Chloride: 95 mmol/L — ABNORMAL LOW (ref 101–111)
Creatinine, Ser: 1.72 mg/dL — ABNORMAL HIGH (ref 0.44–1.00)
GFR calc Af Amer: 35 mL/min — ABNORMAL LOW (ref >60)
GFR calc non Af Amer: 30 mL/min — ABNORMAL LOW (ref >60)
Glucose, Bld: 96 mg/dL (ref 65–99)
Potassium: 4.2 mmol/L (ref 3.5–5.1)
Sodium: 135 mmol/L (ref 135–145)

## 2016-03-02 MED ORDER — POTASSIUM CHLORIDE CRYS ER 20 MEQ PO TBCR: 20.0000 meq | EXTENDED_RELEASE_TABLET | Freq: Every day | ORAL | 6 refills | Status: DC

## 2016-03-02 MED ORDER — TORSEMIDE 20 MG PO TABS: 40.0000 mg | ORAL_TABLET | Freq: Every day | ORAL | 3 refills | Status: DC

## 2016-03-02 NOTE — Addendum Note (Signed)
Addended by: Icarus Partch, Sharlot Gowda on: 03/02/2016 12:12 PM   Modules accepted: Orders

## 2016-03-04 ENCOUNTER — Telehealth (HOSPITAL_COMMUNITY): Payer: Self-pay | Admitting: Cardiology

## 2016-03-04 DIAGNOSIS — I509 Heart failure, unspecified: Secondary | ICD-10-CM

## 2016-03-04 NOTE — Telephone Encounter (Signed)
Lab order placed.

## 2016-03-05 NOTE — Telephone Encounter (Signed)
Addressed during lab appt 10/9

## 2016-03-06 ENCOUNTER — Other Ambulatory Visit (HOSPITAL_COMMUNITY): Payer: Self-pay | Admitting: *Deleted

## 2016-03-07 ENCOUNTER — Other Ambulatory Visit (HOSPITAL_COMMUNITY): Payer: Self-pay | Admitting: Internal Medicine

## 2016-03-09 ENCOUNTER — Other Ambulatory Visit (HOSPITAL_COMMUNITY): Payer: Commercial Managed Care - HMO

## 2016-03-13 ENCOUNTER — Inpatient Hospital Stay (HOSPITAL_COMMUNITY): Admission: RE | Admit: 2016-03-13 | Payer: Commercial Managed Care - HMO | Source: Ambulatory Visit

## 2016-03-16 ENCOUNTER — Ambulatory Visit (INDEPENDENT_AMBULATORY_CARE_PROVIDER_SITE_OTHER): Payer: Commercial Managed Care - HMO | Admitting: *Deleted

## 2016-03-16 ENCOUNTER — Telehealth: Payer: Self-pay

## 2016-03-16 DIAGNOSIS — Z95810 Presence of automatic (implantable) cardiac defibrillator: Secondary | ICD-10-CM | POA: Diagnosis not present

## 2016-03-16 DIAGNOSIS — I509 Heart failure, unspecified: Secondary | ICD-10-CM | POA: Diagnosis not present

## 2016-03-16 DIAGNOSIS — I428 Other cardiomyopathies: Secondary | ICD-10-CM

## 2016-03-16 NOTE — Progress Notes (Signed)
Remote ICD transmission.   

## 2016-03-16 NOTE — Telephone Encounter (Signed)
Remote ICM transmission received.  Attempted patient call and left detailed message regarding transmission and next ICM scheduled for 03/25/2016.  Advised to return call for any fluid symptoms or questions.

## 2016-03-16 NOTE — Progress Notes (Addendum)
EPIC Encounter for ICM Monitoring  Patient Name: Rhonda [name] is a 65 y.o. female Date: 03/16/2016 Primary Care Physican: Jonathon Bellows, MD Primary Cardiologist: [place] Electrophysiologist: Lovena Le Dry Weight: unknown Bi-V Pacing:  93.6%       Attempted ICM call and unable to reach.   Left detailed message regarding transmission. Requested call back.  Transmission reviewed.   Thoracic impedance abnormal suggesting fluid accumulation since 03/02/2016.  Labs: 03/18/2016 Creatinine 1.35, BUN 32, Potassium 4.1, Sodium 139, eGFR 40-48 - completed at PCP office and copy obtained on 10/26 03/02/2016 Creatinine 1.72, BUN 44, Potassium 4.2, Sodium 135  02/19/2016 Creatinine 1.77, BUN 44, Potassium 4.1, Sodium 136  07/10/2015 Creatinine 1.49, BUN 23, Potassium 4.3, Sodium 140  10/04/2014 Creatinine 1.40, BUN 21, Potassium 3.9, Sodium 140    Follow-up plan: ICM clinic phone appointment on 03/25/2016 to recheck fluid levels.  Copy of ICM check sent to primary cardiologist and device physician.   ICM trend: 03/16/2016       [name], RN 03/16/2016 1:30 PM

## 2016-03-17 ENCOUNTER — Encounter: Payer: Self-pay | Admitting: Cardiology

## 2016-03-17 NOTE — Progress Notes (Signed)
Returned patient call.    1st ICM encounter.   Heart Failure questions reviewed, pt symptomatic with increase SOB with daily activities and ankle swelling up to knees.  She said her stomach seems larger but she said it has been this way and not new.  Weight: 256 lbs and denied any weight gain in the last 2 weeks.    Thoracic impedance abnormal suggesting fluid accumulation since decrease in Lasix dosage.  She reported [name] at HF clinic decreased Lasix to 40 mg daily and Potassium 20 mEq 1 tablet daily after labs results at end of September which showed a decrease in kidney function.    She will have repeat labs tomorrow at PCP office.    Recommendations:  Advised would send copy of note to Darrick Grinder, PA, Dr Haroldine Laws and Dr Lovena Le for review and call back with any recommendations.     Will repeat transmission to recheck fluid levels 03/25/2016.

## 2016-03-18 DIAGNOSIS — H6981 Other specified disorders of Eustachian tube, right ear: Secondary | ICD-10-CM | POA: Diagnosis not present

## 2016-03-18 DIAGNOSIS — E1140 Type 2 diabetes mellitus with diabetic neuropathy, unspecified: Secondary | ICD-10-CM | POA: Diagnosis not present

## 2016-03-18 DIAGNOSIS — I10 Essential (primary) hypertension: Secondary | ICD-10-CM | POA: Diagnosis not present

## 2016-03-18 DIAGNOSIS — R5383 Other fatigue: Secondary | ICD-10-CM | POA: Diagnosis not present

## 2016-03-19 ENCOUNTER — Telehealth: Payer: Self-pay

## 2016-03-19 NOTE — Telephone Encounter (Signed)
Spoke with Dr Jason Nest office and requested latest lab results.  Person answering stated patient had BMET done in the last couple of days and would fax results.  Provided fax number.

## 2016-03-20 NOTE — Progress Notes (Signed)
Spoke with patient.  She stated swelling in legs and SOB have improved and feeling a better.  Advised if fluid symptoms worsen over the weekend to go to the hospital.  Will recheck fluid levels on 03/25/2016.  Advised to remain on current prescribed dosage of Torsemide and Potassium at this time.

## 2016-03-24 ENCOUNTER — Other Ambulatory Visit (HOSPITAL_COMMUNITY): Payer: Self-pay | Admitting: Internal Medicine

## 2016-03-25 ENCOUNTER — Ambulatory Visit (INDEPENDENT_AMBULATORY_CARE_PROVIDER_SITE_OTHER): Payer: Commercial Managed Care - HMO

## 2016-03-25 DIAGNOSIS — I509 Heart failure, unspecified: Secondary | ICD-10-CM

## 2016-03-25 DIAGNOSIS — Z95810 Presence of automatic (implantable) cardiac defibrillator: Secondary | ICD-10-CM

## 2016-03-25 NOTE — Progress Notes (Signed)
EPIC Encounter for ICM Monitoring  Patient Name: Rhonda [name] is a 65 y.o. female Date: 03/25/2016 Primary Care Physican: Jonathon Bellows, MD Primary Cardiologist: [place] Electrophysiologist: Lovena Le Dry Weight: unknown Bi-V Pacing:  93.6%                                                    Patient reported she is doing better and swelling has resolved.  She stated she will be on vacation x 5 days and will try to maintain low salt diet.   Thoracic impedance has returned close to baseline.    Labs: 03/18/2016 Creatinine 1.35, BUN 32, Potassium 4.1, Sodium 139, eGFR 40-48 - completed at PCP office and copy obtained on 10/26 03/02/2016 Creatinine 1.72, BUN 44, Potassium 4.2, Sodium 135  02/19/2016 Creatinine 1.77, BUN 44, Potassium 4.1, Sodium 136  07/10/2015 Creatinine 1.49, BUN 23, Potassium 4.3, Sodium 140  10/04/2014 Creatinine 1.40, BUN 21, Potassium 3.9, Sodium 140   Recommendations: No changes.  Advised to limit salt intake to 2000 mg daily.  Encouraged to call for fluid symptoms.    Follow-up plan: ICM clinic phone appointment on 04/23/2016.  Copy of ICM check sent to primary cardiologist and device physician for updated transmission and fluid levels are close to normal.   ICM trend: 03/25/2016       [name], RN 03/25/2016 12:01 PM

## 2016-04-02 DIAGNOSIS — F0632 Mood disorder due to known physiological condition with major depressive-like episode: Secondary | ICD-10-CM | POA: Diagnosis not present

## 2016-04-06 DIAGNOSIS — L309 Dermatitis, unspecified: Secondary | ICD-10-CM | POA: Diagnosis not present

## 2016-04-09 LAB — CUP PACEART REMOTE DEVICE CHECK
Battery Remaining Longevity: 52 mo
Battery Voltage: 2.97 V
Brady Statistic AP VP Percent: 0.03 %
Brady Statistic AP VS Percent: 0.01 %
Brady Statistic AS VP Percent: 99.94 %
Brady Statistic AS VS Percent: 0.02 %
Brady Statistic RA Percent Paced: 0.04 %
Brady Statistic RV Percent Paced: 93.63 %
Date Time Interrogation Session: 20171023041805
HighPow Impedance: 79 Ohm
Implantable Lead Implant Date: 20141210
Implantable Lead Implant Date: 20141210
Implantable Lead Implant Date: 20141210
Implantable Lead Location: 753858
Implantable Lead Location: 753859
Implantable Lead Location: 753860
Implantable Lead Model: 4396
Implantable Lead Model: 5076
Implantable Lead Model: 6935
Implantable Pulse Generator Implant Date: 20141210
Lead Channel Impedance Value: 380 Ohm
Lead Channel Impedance Value: 380 Ohm
Lead Channel Impedance Value: 494 Ohm
Lead Channel Impedance Value: 532 Ohm
Lead Channel Impedance Value: 627 Ohm
Lead Channel Impedance Value: 646 Ohm
Lead Channel Pacing Threshold Amplitude: 0.75 V
Lead Channel Pacing Threshold Amplitude: 0.875 V
Lead Channel Pacing Threshold Amplitude: 1.125 V
Lead Channel Pacing Threshold Pulse Width: 0.4 ms
Lead Channel Pacing Threshold Pulse Width: 0.4 ms
Lead Channel Pacing Threshold Pulse Width: 0.4 ms
Lead Channel Sensing Intrinsic Amplitude: 2.5 mV
Lead Channel Sensing Intrinsic Amplitude: 2.5 mV
Lead Channel Sensing Intrinsic Amplitude: 22.625 mV
Lead Channel Sensing Intrinsic Amplitude: 22.625 mV
Lead Channel Setting Pacing Amplitude: 2 V
Lead Channel Setting Pacing Amplitude: 2.5 V
Lead Channel Setting Pacing Amplitude: 2.5 V
Lead Channel Setting Pacing Pulse Width: 0.4 ms
Lead Channel Setting Pacing Pulse Width: 0.4 ms
Lead Channel Setting Sensing Sensitivity: 0.3 mV

## 2016-04-23 ENCOUNTER — Ambulatory Visit (INDEPENDENT_AMBULATORY_CARE_PROVIDER_SITE_OTHER): Payer: Commercial Managed Care - HMO

## 2016-04-23 DIAGNOSIS — I509 Heart failure, unspecified: Secondary | ICD-10-CM | POA: Diagnosis not present

## 2016-04-23 DIAGNOSIS — Z95810 Presence of automatic (implantable) cardiac defibrillator: Secondary | ICD-10-CM

## 2016-04-23 NOTE — Progress Notes (Signed)
EPIC Encounter for ICM Monitoring  Patient Name: Rhonda [name] is a 65 y.o. female Date: 04/23/2016 Primary Care Physican: Jonathon Bellows, MD Primary Cardiologist: [place] Electrophysiologist: Druscilla Brownie Weight:unknown Bi-V Pacing: 99.8%      Heart Failure questions reviewed, pt symptomatic with swelling in ankles and stomach  Thoracic impedance normal but was abnormal suggesting fluid accumulation from 04/01/2016 to 04/15/2016.    Recommendations:   Copy sent to Darrick Grinder, NP since she saw patient in September.  Patient is confused on how she should be taking Potassium and Torsemide.  Advised would get confirmation on correct dosages of Potassium and Torsemide.   Currently taking Potassium 20 meq 2 tablets (40 meq total) in am and Torsemide 20 mg 2 tablets (40 mg total) in am.   She stated she thought she is supposed to be on torsemide 40 mg am and 20 mg pm and KDUR 40 meq q am and 20 meq q pm.    Per note on 02/21/2016 patient was to take lasix 40 mg daily + 20 meq potassium which matches current prescriptions (Patient takes Torsemide instead of Lasix).   Patient thinks she should be taking more than 40 mg of Torsemide daily since she symptomatic.    Follow-up plan: ICM clinic phone appointment on 05/28/2016.    Copy of ICM check sent to cardiologist, NP and device physician.   ICM trend: 04/23/2016       [name], RN 04/23/2016 5:49 PM

## 2016-04-28 NOTE — Progress Notes (Signed)
Call to patient for follow up on symptoms.  She reported ankle and stomach swelling have improved and she is feeling better.   She stated she is still taking Torsemide 20 mg 2 tablets (40 mg total) in am and if needed 1 tablet (20 mg total) in pm.   She is taking Potassium 20 meq 2 tablets (40 meq total) in pm and and additional tablet 20 meq in evening if she takes extra Torsemide     Advised she should take medication as prescribed at last office, 02/21/2016 which is Torsemide 40 mg 1 tablet daily and Potassium 20 meq 1 tablet daily.  She verbalized understanding and feels this dosage is not strong enough to keep her symptoms from returning.     Advised would let Dr Haroldine Laws and Darrick Grinder, PA know of the medication discrepancy.  She will send updated transmission for 04/30/2016.

## 2016-04-30 ENCOUNTER — Ambulatory Visit (INDEPENDENT_AMBULATORY_CARE_PROVIDER_SITE_OTHER): Payer: Commercial Managed Care - HMO

## 2016-04-30 ENCOUNTER — Telehealth: Payer: Self-pay | Admitting: Cardiology

## 2016-04-30 DIAGNOSIS — I509 Heart failure, unspecified: Secondary | ICD-10-CM

## 2016-04-30 DIAGNOSIS — Z95810 Presence of automatic (implantable) cardiac defibrillator: Secondary | ICD-10-CM

## 2016-04-30 NOTE — Telephone Encounter (Signed)
LMOVM reminding pt to send remote transmission.   

## 2016-04-30 NOTE — Progress Notes (Signed)
EPIC Encounter for ICM Monitoring  Patient Name: Rhonda [name] is a 65 y.o. female Date: 04/30/2016 Primary Care Physican: Jonathon Bellows, MD Primary Cardiologist: [place] Electrophysiologist: Druscilla Brownie Weight:unknown Bi-V Pacing: 99.8%      Heart Failure questions reviewed, pt asymptomatic   Thoracic impedance returned close to normal   Recommendations: No changes.  Reinforced to limit low salt food choices to 2000 mg day and limiting fluid intake to < 2 liters per day. Encouraged to call for fluid symptoms.    Follow-up plan: ICM clinic phone appointment on 05/28/2016.    Copy of ICM check sent to primary cardiologist and device physician for updated fluid levels.    ICM trend: 04/30/2016       [name], RN 04/30/2016 2:21 PM

## 2016-05-05 ENCOUNTER — Telehealth (HOSPITAL_COMMUNITY): Payer: Self-pay | Admitting: *Deleted

## 2016-05-05 NOTE — Telephone Encounter (Signed)
Faxed medical records to Dr. Ulyses Amor  Office  as requested.

## 2016-05-28 ENCOUNTER — Ambulatory Visit (INDEPENDENT_AMBULATORY_CARE_PROVIDER_SITE_OTHER): Payer: Medicare HMO

## 2016-05-28 DIAGNOSIS — Z95810 Presence of automatic (implantable) cardiac defibrillator: Secondary | ICD-10-CM

## 2016-05-28 DIAGNOSIS — I5022 Chronic systolic (congestive) heart failure: Secondary | ICD-10-CM

## 2016-05-28 DIAGNOSIS — M722 Plantar fascial fibromatosis: Secondary | ICD-10-CM | POA: Diagnosis not present

## 2016-05-29 ENCOUNTER — Telehealth: Payer: Self-pay

## 2016-05-29 DIAGNOSIS — I5022 Chronic systolic (congestive) heart failure: Secondary | ICD-10-CM

## 2016-05-29 DIAGNOSIS — Z95810 Presence of automatic (implantable) cardiac defibrillator: Secondary | ICD-10-CM | POA: Diagnosis not present

## 2016-05-29 NOTE — Telephone Encounter (Signed)
Remote ICM transmission received.  Attempted patient call and left message with husband to return call.

## 2016-05-29 NOTE — Progress Notes (Signed)
Patient returned call.  She stated her feet are swollen in the last couple of weeks.  Patient continues to have difficulty regarding how she should be taking Torsemide and Potassium.  Reviewed prescription and advised on 03/02/2016, the last prescription, she should take Torsemide 20 mg 2 tablets (40 mg total) daily and Potassium 20 meq 1 tablet daily.  She stated she must have transferred the Torsemide to an older bottle which says 2 tablets in morning and 1 tablet in afternoon.  Advised she should not put meds in older bottles and to take prescribed dosage that we have spoken about.  She thinks the current dosage is not high enough.  Advised she should make an appointment with HF clinic if needed so she could be re-evaluated.    Advised would send copy to Dr Haroldine Laws and if any recommendations will call her back.

## 2016-05-29 NOTE — Progress Notes (Signed)
EPIC Encounter for ICM Monitoring  Patient Name: Rhonda [name] is a 67 y.o. female Date: 05/29/2016 Primary Care Physican: Jonathon Bellows, MD Primary Cardiologist: [place] Electrophysiologist: Druscilla Brownie Weight:unknown Bi-V Pacing: 99.6%      Attempted ICM call x 2 and unable to reach.  Left message to return call.  Transmission reviewed.   Thoracic impedance abnormal suggesting fluid accumulation.  Labs: 03/18/2016 Creatinine 1.35, BUN 32, Potassium 4.1, Sodium 139, EGFR 40-48 03/02/2016 Creatinine 1.72, BUN 44, Potassium 4.2, Sodium 135, EGFR 30-35  02/19/2016 Creatinine 1.77, BUN 44, Potassium 4.1, Sodium 136, EGFR 29-34  07/10/2015 Creatinine 1.49, BUN 23, Potassium 4.3, Sodium 140, EGFR 36-42   Recommendations:  NONE - unable to reach   Follow-up plan: ICM clinic phone appointment on 06/04/2016 to recheck fluid levels.  Copy of ICM check sent to primary cardiologist and device physician.   3 month ICM trend : 05/28/2016   1 Year ICM trend:      [name], RN 05/29/2016 8:38 AM

## 2016-05-31 ENCOUNTER — Other Ambulatory Visit (HOSPITAL_COMMUNITY): Payer: Self-pay | Admitting: Internal Medicine

## 2016-05-31 ENCOUNTER — Other Ambulatory Visit (HOSPITAL_COMMUNITY): Payer: Self-pay | Admitting: Adult Health

## 2016-05-31 DIAGNOSIS — I509 Heart failure, unspecified: Secondary | ICD-10-CM

## 2016-06-03 DIAGNOSIS — M79672 Pain in left foot: Secondary | ICD-10-CM | POA: Diagnosis not present

## 2016-06-04 ENCOUNTER — Ambulatory Visit (INDEPENDENT_AMBULATORY_CARE_PROVIDER_SITE_OTHER): Payer: Medicare HMO

## 2016-06-04 DIAGNOSIS — Z95810 Presence of automatic (implantable) cardiac defibrillator: Secondary | ICD-10-CM

## 2016-06-04 DIAGNOSIS — I5022 Chronic systolic (congestive) heart failure: Secondary | ICD-10-CM

## 2016-06-04 NOTE — Progress Notes (Signed)
EPIC Encounter for ICM Monitoring  Patient Name: Rhonda [name] is a 66 y.o. female Date: 06/04/2016 Primary Care Physican: Jonathon Bellows, MD Primary Cardiologist: [place] Electrophysiologist: Druscilla Brownie Weight: 253 lbs Bi-V Pacing: 99.1%                                        Heart Failure questions reviewed, pt asymptomatic.  Patient had a fall and hit her head really hard last night.  She called PCP and was advised to stay at home unless she starts vomiting.  She has a headache today but no vomiting.    Thoracic impedance almost back at baseline.    Labs: 03/18/2016 Creatinine 1.35, BUN 32, Potassium 4.1, Sodium 139, EGFR 40-48 03/02/2016 Creatinine 1.72, BUN 44, Potassium 4.2, Sodium 135, EGFR 30-35  02/19/2016 Creatinine 1.77, BUN 44, Potassium 4.1, Sodium 136, EGFR 29-34  07/10/2015 Creatinine 1.49, BUN 23, Potassium 4.3, Sodium 140, EGFR 36-42   Recommendations:   No changes.  Reinforced to limit low salt food choices to 2000 mg day and limiting fluid intake to < 2 liters per day. Encouraged to call for fluid symptoms.    Follow-up plan: ICM clinic phone appointment on 06/29/2016.  Copy of ICM check sent to device physician.   3 month ICM trend : 06/04/2016   1 Year ICM trend:      [name], RN 06/04/2016 9:57 AM

## 2016-06-09 ENCOUNTER — Ambulatory Visit
Admission: RE | Admit: 2016-06-09 | Discharge: 2016-06-09 | Disposition: A | Discharge status: Home / Self Care | Payer: Medicare HMO | Source: Ambulatory Visit | Attending: Family Medicine | Admitting: Family Medicine

## 2016-06-09 ENCOUNTER — Other Ambulatory Visit: Payer: Self-pay | Admitting: Family Medicine

## 2016-06-09 DIAGNOSIS — M7732 Calcaneal spur, left foot: Secondary | ICD-10-CM | POA: Diagnosis not present

## 2016-06-09 DIAGNOSIS — M79672 Pain in left foot: Secondary | ICD-10-CM

## 2016-06-29 ENCOUNTER — Telehealth: Payer: Self-pay | Admitting: Cardiology

## 2016-06-29 ENCOUNTER — Ambulatory Visit (INDEPENDENT_AMBULATORY_CARE_PROVIDER_SITE_OTHER): Payer: Medicare HMO | Admitting: *Deleted

## 2016-06-29 ENCOUNTER — Telehealth: Payer: Self-pay

## 2016-06-29 DIAGNOSIS — I428 Other cardiomyopathies: Secondary | ICD-10-CM

## 2016-06-29 NOTE — Telephone Encounter (Signed)
Remote ICM transmission received.  Attempted patient call and left message to return call.   

## 2016-06-29 NOTE — Telephone Encounter (Signed)
Spoke with pt and reminded pt of remote transmission that is due today. Pt verbalized understanding.   

## 2016-06-30 ENCOUNTER — Encounter: Payer: Self-pay | Admitting: Sports Medicine

## 2016-06-30 ENCOUNTER — Ambulatory Visit (INDEPENDENT_AMBULATORY_CARE_PROVIDER_SITE_OTHER): Payer: Medicare HMO | Admitting: Sports Medicine

## 2016-06-30 ENCOUNTER — Ambulatory Visit (INDEPENDENT_AMBULATORY_CARE_PROVIDER_SITE_OTHER): Payer: Medicare HMO

## 2016-06-30 ENCOUNTER — Ambulatory Visit: Payer: Medicare HMO

## 2016-06-30 DIAGNOSIS — I5022 Chronic systolic (congestive) heart failure: Secondary | ICD-10-CM | POA: Diagnosis not present

## 2016-06-30 DIAGNOSIS — M79672 Pain in left foot: Secondary | ICD-10-CM

## 2016-06-30 DIAGNOSIS — M722 Plantar fascial fibromatosis: Secondary | ICD-10-CM

## 2016-06-30 DIAGNOSIS — Z95810 Presence of automatic (implantable) cardiac defibrillator: Secondary | ICD-10-CM

## 2016-06-30 LAB — CUP PACEART REMOTE DEVICE CHECK
Battery Remaining Longevity: 46 mo
Battery Voltage: 2.97 V
Brady Statistic AP VP Percent: 0.03 %
Brady Statistic AP VS Percent: 0.01 %
Brady Statistic AS VP Percent: 99.94 %
Brady Statistic AS VS Percent: 0.03 %
Brady Statistic RA Percent Paced: 0.03 %
Brady Statistic RV Percent Paced: 99.43 %
Date Time Interrogation Session: 20180205062503
HighPow Impedance: 74 Ohm
Implantable Lead Implant Date: 20141210
Implantable Lead Implant Date: 20141210
Implantable Lead Implant Date: 20141210
Implantable Lead Location: 753858
Implantable Lead Location: 753859
Implantable Lead Location: 753860
Implantable Lead Model: 4396
Implantable Lead Model: 5076
Implantable Lead Model: 6935
Implantable Pulse Generator Implant Date: 20141210
Lead Channel Impedance Value: 342 Ohm
Lead Channel Impedance Value: 380 Ohm
Lead Channel Impedance Value: 494 Ohm
Lead Channel Impedance Value: 532 Ohm
Lead Channel Impedance Value: 627 Ohm
Lead Channel Impedance Value: 627 Ohm
Lead Channel Pacing Threshold Amplitude: 0.625 V
Lead Channel Pacing Threshold Amplitude: 0.875 V
Lead Channel Pacing Threshold Amplitude: 1.25 V
Lead Channel Pacing Threshold Pulse Width: 0.4 ms
Lead Channel Pacing Threshold Pulse Width: 0.4 ms
Lead Channel Pacing Threshold Pulse Width: 0.4 ms
Lead Channel Sensing Intrinsic Amplitude: 28.75 mV
Lead Channel Sensing Intrinsic Amplitude: 28.75 mV
Lead Channel Sensing Intrinsic Amplitude: 3 mV
Lead Channel Sensing Intrinsic Amplitude: 3 mV
Lead Channel Setting Pacing Amplitude: 2 V
Lead Channel Setting Pacing Amplitude: 2.5 V
Lead Channel Setting Pacing Amplitude: 2.75 V
Lead Channel Setting Pacing Pulse Width: 0.4 ms
Lead Channel Setting Pacing Pulse Width: 0.4 ms
Lead Channel Setting Sensing Sensitivity: 0.3 mV

## 2016-06-30 MED ORDER — TRIAMCINOLONE ACETONIDE 40 MG/ML IJ SUSP: 20.0000 mg | Freq: Once | INTRAMUSCULAR | Status: DC

## 2016-06-30 MED ORDER — METHYLPREDNISOLONE 4 MG PO TBPK: ORAL_TABLET | ORAL | 0 refills | Status: DC

## 2016-06-30 NOTE — Patient Instructions (Addendum)

## 2016-06-30 NOTE — Progress Notes (Signed)
Subjective: Rhonda [name] is a 66 y.o. female patient presents to office with complaint of heel pain on the left. Patient admits to post static dyskinesia for 1 month in duration. Patient has treated this problem with icing, elevated, epsom, gel heel cushion with no relief. Denies any other pedal complaints.   Patient Active Problem List   Diagnosis Date Noted  . HTN (hypertension) 09/14/2013  . Chronic respiratory failure ([place]) 09/14/2013  . 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 Morbid obesity ([place]) 11/01/2012  . Chronic systolic heart failure ([place]) 10/27/2012  . S/P right TKA 05/10/2012  . Endometrial polyp 01/20/2012  . Breast cancer, stage 1 ([place]) 03/26/2011  . Unspecified vitamin D deficiency 03/26/2011    Current Outpatient Prescriptions on File Prior to Visit  Medication Sig Dispense Refill  . albuterol (PROVENTIL HFA;VENTOLIN HFA) 108 (90 BASE) MCG/ACT inhaler Inhale 2 puffs into the lungs every 6 (six) hours as needed (ON HOLD). Wheezing    . albuterol (PROVENTIL) (2.5 MG/3ML) 0.083% nebulizer solution Take 2.5 mg by nebulization every 6 (six) hours as needed. Wheezing     . ARIPiprazole (ABILIFY) 2 MG tablet Take 2 mg by mouth every morning.     Marland Kitchen buPROPion (WELLBUTRIN XL) 150 MG 24 hr tablet Take 450 mg by mouth every morning.     . carvedilol (COREG) 6.25 MG tablet take 1 tablet by mouth every morning and 2 tablets every evening 90 tablet 3  . clonazePAM (KLONOPIN) 0.5 MG tablet Take 0.5 mg by mouth 2 (two) times daily as needed for anxiety. 1 in morning, 1 at bedtime    . cyclobenzaprine (FLEXERIL) 10 MG tablet Take 10 mg by mouth 3 (three) times daily as needed for muscle spasms.    . febuxostat (ULORIC) 40 MG tablet Take 40 mg by mouth at bedtime.     . gabapentin (NEURONTIN) 300 MG capsule Take 300 mg by mouth 3 (three) times daily.     Marland Kitchen ibuprofen (ADVIL,MOTRIN) 800 MG tablet Take 800 mg by mouth 3 (three) times daily as needed  for mild pain.    Marland Kitchen levothyroxine (SYNTHROID, LEVOTHROID) 125 MCG tablet Take 125 mcg by mouth every morning. On an empty stomach    . lisinopril (PRINIVIL,ZESTRIL) 2.5 MG tablet take 1 tablet by mouth at bedtime 30 tablet 6  . meloxicam (MOBIC) 15 MG tablet Take 15 mg by mouth daily as needed for pain.    . methocarbamol (ROBAXIN) 500 MG tablet Take 500 mg by mouth every 6 (six) hours as needed for muscle spasms.    . montelukast (SINGULAIR) 10 MG tablet Take 10 mg by mouth at bedtime.    . potassium chloride SA (K-DUR,KLOR-CON) 20 MEQ tablet Take 1 tablet (20 mEq total) by mouth daily. 90 tablet 6  . simvastatin (ZOCOR) 10 MG tablet Take 10 mg by mouth every evening.     Marland Kitchen spironolactone (ALDACTONE) 25 MG tablet take 1 tablet by mouth once daily 30 tablet 3  . torsemide (DEMADEX) 20 MG tablet Take 2 tablets (40 mg total) by mouth daily. 90 tablet 3  . traMADol (ULTRAM) 50 MG tablet Take 50 mg by mouth every 6 (six) hours as needed for moderate pain.    Marland Kitchen zolpidem (AMBIEN) 10 MG tablet Take 10 mg by mouth at bedtime as needed. For sleep     No current facility-administered medications on file prior to visit.     Allergies  Allergen Reactions  . Ceftin Anaphylaxis  Face and throat swell   . Shellfish Allergy Other (See Comments)    Gout exacerbation  . Ativan [Lorazepam] Itching  . Allopurinol Nausea Only and Other (See Comments)    weakness  . Lorazepam Itching  . Sulfa Antibiotics Itching  . Ultram [Tramadol Hcl] Itching    Objective: Physical Exam General: The patient is alert and oriented x3 in no acute distress.  Dermatology: Skin is warm, dry and supple bilateral lower extremities. Nails 1-10 are normal. There is no erythema, edema, no eccymosis, no open lesions present. Integument is otherwise unremarkable.  Vascular: Dorsalis Pedis pulse and Posterior Tibial pulse are 1/4 bilateral. Capillary fill time is immediate to all digits.  Neurological: Grossly intact to light  touch with an achilles reflex of +2/5 and a  negative Tinel's sign bilateral.  Musculoskeletal: Tenderness to palpation at the medial calcaneal tubercale and through the insertion of the plantar fascia on the left foot. No pain with compression of calcaneus bilateral. No pain with tuning fork to calcaneus bilateral. No pain with calf compression bilateral. There is decreased Ankle joint range of motion bilateral. All other joints range of motion within normal limits bilateral. Severe pes planus. Strength 5/5 in all groups bilateral.   Gait: Unassisted, Antalgic avoid weight on left heel  Xray, Left foot:  Normal osseous mineralization. Joint spaces preserved except at midfoot. No fracture/dislocation/boney destruction. Calcaneal spur present with mild thickening of plantar fascia. Pes planus. No other soft tissue abnormalities or radiopaque foreign bodies.   Assessment and Plan: Problem List Items Addressed This Visit    None    Visit Diagnoses    Plantar fasciitis    -  Primary   Relevant Medications   methylPREDNISolone (MEDROL DOSEPAK) 4 MG TBPK tablet   triamcinolone acetonide (KENALOG-40) injection 20 mg (Start on 06/30/2016  1:15 PM)   Foot pain, left       Relevant Medications   methylPREDNISolone (MEDROL DOSEPAK) 4 MG TBPK tablet   Other Relevant Orders   DG Foot 2 Views Left      -Complete examination performed.  -Xrays reviewed -Discussed with patient in detail the condition of plantar fasciitis, how this occurs and general treatment options. Explained both conservative and surgical treatments.  -After oral consent and aseptic prep, injected a mixture containing 1 ml of 2%  plain lidocaine, 1 ml 0.5% plain marcaine, 0.5 ml of kenalog 40 and 0.5 ml of dexamethasone phosphate into left heel. Post-injection care discussed with patient.  -Rx  Medrol dose pack to take as instructed  -Recommended good supportive shoes and advised use of OTC insert. Explained to patient that if  these orthoses work well, we will continue with these. If these do not improve her condition and  pain, we will consider custom molded orthoses. Gave heel cushions.  -Explained and dispensed to patient daily stretching exercises. -Recommend patient to ice affected area 1-2x daily. -Patient to return to office in 4 weeks for follow up or sooner if problems or questions arise.  Landis Martins, DPM

## 2016-06-30 NOTE — Progress Notes (Signed)
Remote ICD transmission.   

## 2016-06-30 NOTE — Progress Notes (Signed)
EPIC Encounter for ICM Monitoring  Patient Name: Rhonda [name] is a 66 y.o. female Date: 06/29/2016 Primary Care Physican: Jonathon Bellows, MD Primary Cardiologist: [place] Electrophysiologist: Druscilla Brownie Weight: 350 lbs Bi-V Pacing: 99.4%  Heart Failure questions reviewed, pt asymptomatic.  She denied any fluid symptoms.   She has not weighed recently   Thoracic impedance abnormal suggesting fluid accumulation.   Confirmed she takes Torsemide 20 mg 2 tablets (40 mg total) daily and Potassium 20 meq 1 tablet daily.   Labs: 10/25/2017Creatinine 1.35, BUN 32, Potassium 4.1, Sodium 139, EGFR 40-48 03/02/2016 Creatinine 1.72, BUN 44, Potassium 4.2, Sodium 135, EGFR 30-35  02/19/2016 Creatinine 1.77, BUN 44, Potassium 4.1, Sodium 136, EGFR 29-34  07/10/2015 Creatinine 1.49, BUN 23, Potassium 4.3, Sodium 140, EGFR 36-42   Discussed low salt diet and food choices.  Advised to limit to 2000 mg daily.   Recommendations:  Copy of ICM check sent to Dr Haroldine Laws, Darrick Grinder, NP HF clinic and Dr Lovena Le for review and recommendations.    Follow-up plan: ICM clinic phone appointment on 07/07/2016.    3 month ICM trend: 06/29/2016   1 Year ICM trend:      [name], RN 06/29/2016 12:45 PM

## 2016-07-01 ENCOUNTER — Other Ambulatory Visit (HOSPITAL_COMMUNITY): Payer: Self-pay | Admitting: Adult Health

## 2016-07-01 ENCOUNTER — Encounter: Payer: Self-pay | Admitting: Cardiology

## 2016-07-03 NOTE — Progress Notes (Signed)
Call to patient and advised no recommendations received and to continue with her prescribed dose of Torsemide and Potassium.  Reviewed HF symptoms and next ICM remote transmission 07/07/2016. Advised to use ER if needed over the weekend.

## 2016-07-06 NOTE — Progress Notes (Signed)
Call to patient and advised Dr Haroldine Laws recommended she add 1 tablet of Torsemide 20 mg in the afternoon x 2 days starting today and then return to prescribed dosage of Torsemide 20 mg 2 tablets (40 mg total) daily.   She repeated and verbalized she understood.  Will recheck ICM remote transmission on 07/09/2016.

## 2016-07-06 NOTE — Progress Notes (Signed)
Jolaine Artist, MD  [name], RN        Can take extra 20 torsemide in afternoon for 1-2 days   Thanks

## 2016-07-09 ENCOUNTER — Telehealth: Payer: Self-pay

## 2016-07-09 ENCOUNTER — Ambulatory Visit (INDEPENDENT_AMBULATORY_CARE_PROVIDER_SITE_OTHER): Payer: Medicare HMO

## 2016-07-09 DIAGNOSIS — I5022 Chronic systolic (congestive) heart failure: Secondary | ICD-10-CM

## 2016-07-09 DIAGNOSIS — Z95810 Presence of automatic (implantable) cardiac defibrillator: Secondary | ICD-10-CM

## 2016-07-09 NOTE — Telephone Encounter (Signed)
Remote ICM transmission received.  Attempted patient call and left detailed message regarding transmission and next ICM scheduled for 08/06/2016.  Advised to return call for any fluid symptoms or questions.    

## 2016-07-09 NOTE — Progress Notes (Signed)
EPIC Encounter for ICM Monitoring  Patient Name: NICOLA HEINEMANN is a 66 y.o. female Date: 07/09/2016 Primary Care Physican: Jonathon Bellows, MD Primary Cardiologist: [place] Electrophysiologist: Druscilla Brownie Weight:unknown Bi-V Pacing: 99%       Attempted call to patient and unable to reach.  Left detailed message regarding transmission.  Transmission reviewed.    Thoracic impedance returned to normal after taking 2 days of increased Torsemide.    Current prescribed dose of Torsemide 20 mg 2 tablets (40 mg total) daily and Potassium 20 meq 1 tablet daily.  Labs: 10/25/2017Creatinine 1.35, BUN 32, Potassium 4.1, Sodium 139, EGFR 40-48 03/02/2016 Creatinine 1.72, BUN 44, Potassium 4.2, Sodium 135, EGFR 30-35  02/19/2016 Creatinine 1.77, BUN 44, Potassium 4.1, Sodium 136, EGFR 29-34  07/10/2015 Creatinine 1.49, BUN 23, Potassium 4.3, Sodium 140, EGFR 36-42   Recommendations: Left voice mail with ICM number and encouraged to call for fluid symptoms..  Follow-up plan: ICM clinic phone appointment on 08/06/2016.  Copy of ICM check sent to primary cardiologist and device physician for updated transmission showing impedance returned to normal.   3 month ICM trend: 07/09/2016   1 Year ICM trend:      [name], RN 07/09/2016 8:07 AM

## 2016-07-15 DIAGNOSIS — J329 Chronic sinusitis, unspecified: Secondary | ICD-10-CM | POA: Diagnosis not present

## 2016-07-20 ENCOUNTER — Other Ambulatory Visit (HOSPITAL_COMMUNITY): Payer: Self-pay | Admitting: Internal Medicine

## 2016-07-30 ENCOUNTER — Other Ambulatory Visit (HOSPITAL_COMMUNITY): Payer: Self-pay | Admitting: Adult Health

## 2016-07-31 ENCOUNTER — Ambulatory Visit (INDEPENDENT_AMBULATORY_CARE_PROVIDER_SITE_OTHER): Payer: Medicare HMO | Admitting: Neurology

## 2016-07-31 ENCOUNTER — Encounter: Payer: Self-pay | Admitting: Neurology

## 2016-07-31 ENCOUNTER — Other Ambulatory Visit (INDEPENDENT_AMBULATORY_CARE_PROVIDER_SITE_OTHER): Payer: Medicare HMO

## 2016-07-31 VITALS — BP 110/70 | HR 82 | Ht 61.0 in | Wt 250.5 lb

## 2016-07-31 DIAGNOSIS — R413 Other amnesia: Secondary | ICD-10-CM

## 2016-07-31 DIAGNOSIS — R292 Abnormal reflex: Secondary | ICD-10-CM | POA: Diagnosis not present

## 2016-07-31 DIAGNOSIS — R296 Repeated falls: Secondary | ICD-10-CM

## 2016-07-31 DIAGNOSIS — R51 Headache: Secondary | ICD-10-CM

## 2016-07-31 DIAGNOSIS — E1140 Type 2 diabetes mellitus with diabetic neuropathy, unspecified: Secondary | ICD-10-CM

## 2016-07-31 DIAGNOSIS — R519 Headache, unspecified: Secondary | ICD-10-CM

## 2016-07-31 LAB — VITAMIN B12: Vitamin B-12: 597 pg/mL (ref 211–911)

## 2016-07-31 NOTE — Progress Notes (Signed)
[facility] Clinic Note - Initial Visit   Date: 07/31/16  [name] MRN: [id number] DOB: [birth year]   Dear Dr. Justin Mend:  Thank you for your kind referral of Rhonda [name] for consultation of falls. Although her history is well known to you, please allow [place] to reiterate it for the purpose of our medical record. The patient was accompanied to the clinic by husband who also provides collateral information.     History of Present Illness: Rhonda [name] is a 66 y.o. right handed female with history of hyperlipidemia, hypertension, hypothyroidism, diabetes mellitus, CHF secondary to cardiomyopathy s/p PPM/ICD, and breast cancer s/p chemotherapy, radiation, and lumpectomy (2012) referred for evaluation of falls.    She had suffered three falls since November 2017.  Her first fall occurred at her daughter's home while she was walking down the stairs into her garage.  She feels that she missed the step and hit her head on sheetrock on the left.  There was no loss of consciousness.  Her second fall occurred while walking up the steps to her neighbor's home.  She fell backwards and hit her head against the car in the driveway.  With her third fall, she was walking her dog and tripped and fell on her right side.   She did not feel that her falls occurred because of leg weakness, but moreso because of imbalance.  She walks unassisted.    She has been diabetic for at least 10 years which has been complicated by neuropathy.  She has stabbing, needle-like sensation over the dorsum of the feet bilaterally. It does not involve her lower legs.  Pain is triggered by light pressured of bed sheets and socks. She takes gabapentin 300mg  three times daily which helps.  No weakness.    She also complains of right sided morning headaches which started in December 2017 and have been daily.  She gets relief with NSAIDs and Tylenol.   Over the past several years, she has noticed problems  with short-term memory and family get frustrated with her because she is "slow".  She is able to perform household chores like cooking and laundry.  She manages her own medications and drives, but sometimes feels uncomfortable when driving.  She does not manage her finances.    Out-side paper records, electronic medical record, and images have been reviewed where available and summarized as:   Lab Results  Component Value Date   HGBA1C (H) 08/05/2009    8.5 (NOTE) The ADA recommends the following therapeutic goal for glycemic control related to Hgb A1c measurement: Goal of therapy: <6.5 Hgb A1c  Reference: American Diabetes Association: Clinical Practice Recommendations 2010, Diabetes Care, 2010, 33: (Suppl  1).     Past Medical History:  Diagnosis Date  . Anemia   . Anxiety   . Arthritis    RIGHT KNEE ARTHRITIS AND PAIN-PT TOLD BONE ON BONE  . Asthma   . Back pain    DISK PROBLEM  . Breast cancer, stage 1 ([place]) 03/26/2011   left-FINISHED CHEMO  AND RADIATION  . Cardiomyopathy    [name]  . Chronic systolic heart failure (HCC)    a) NICM b) ECHO (03/2013) EF 20-25% c) ECHO (09/2013) EF 45-50%, grade I DD  . Complication of anesthesia    PT STATES HER B/P LOW AFTER ONE OF HER SURGERIES--SHE ATTRIBUTES TO LYING FLAT  . Depression   . Diabetes mellitus    "diet controlled" (  05/04/2103)  . Exertional shortness of breath   . GERD (gastroesophageal reflux disease)   . Gout   . Heart murmur   . Hyperlipidemia   . Hypertension   . Hypothyroidism   . Left bundle branch block    s/p CRT-D (04/2013)  . Migraines   . On home oxygen therapy    "2L suppose to be q night" (05/03/2013)  . Peripheral neuropathy (HCC)    feet  . SVD (spontaneous vaginal delivery)    x 2  . Unspecified vitamin D deficiency 03/26/2011   does not take meds    Past Surgical History:  Procedure Laterality Date  . BI-VENTRICULAR IMPLANTABLE CARDIOVERTER DEFIBRILLATOR N/A  05/03/2013   Procedure: BI-VENTRICULAR IMPLANTABLE CARDIOVERTER DEFIBRILLATOR  (CRT-D);  Surgeon: Evans Lance, MD;  Location[facility] C[facility];  Service: Cardiovascular;  Laterality: N/A;  . BI-VENTRICULAR IMPLANTABLE CARDIOVERTER DEFIBRILLATOR  (CRT-D)  05/03/2013   MDT CRTD implanted by Dr Lovena Le for non ischemic cardiomyopathy  . BREAST LUMPECTOMY Left 07/28/2010  . CARDIAC CATHETERIZATION  2010   NORMAL CORONARY ARTERIES  . 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--11 SURGERIES     PT HAS REMOVABLE SPEECH BULB-TAKES IT OUT BEFORE HER SURGERY  . COLONOSCOPY    . HYSTEROSCOPY W/D&C  01/20/2012   Procedure: DILATATION AND CURETTAGE /HYSTEROSCOPY;  Surgeon: Margarette Asal, MD;  Location: [facility];  Service: Gynecology;  Laterality: N/A;  with trueclear  . PORT-A-CATH REMOVAL  09/23/2011   Procedure: REMOVAL PORT-A-CATH;  Surgeon: Rolm Bookbinder, MD;  Location: [facility];  Service: General;  Laterality: N/A;  Port Removal  . PORTACATH PLACEMENT  2012  . TONSILLECTOMY  1960's  . TOTAL KNEE ARTHROPLASTY  05/10/2012   Procedure: TOTAL KNEE ARTHROPLASTY;  Surgeon: Mauri Pole, MD;  Location: [facility];  Service: Orthopedics;  Laterality: Right;  . WISDOM TOOTH EXTRACTION       Medications:  Outpatient Encounter Prescriptions as of 07/31/2016  Medication Sig Note  . ARIPiprazole (ABILIFY) 2 MG tablet Take 2 mg by mouth every morning.    Marland Kitchen buPROPion (WELLBUTRIN XL) 150 MG 24 hr tablet Take 450 mg by mouth every morning.    . carvedilol (COREG) 6.25 MG tablet take 1 tablet by mouth every morning and 2 tablets every evening   . clonazePAM (KLONOPIN) 0.5 MG tablet Take 0.5 mg by mouth 2 (two) times daily as needed for anxiety. 1 in morning, 1 at bedtime   . febuxostat (ULORIC) 40 MG tablet Take 40 mg by mouth at bedtime.    . gabapentin (NEURONTIN) 300 MG capsule Take 300 mg by mouth 3 (three) times daily.  09/14/2013: Takes tid currently  . ibuprofen (ADVIL,MOTRIN) 800 MG tablet Take 800 mg by mouth 3 (three)  times daily as needed for mild pain.   Marland Kitchen levothyroxine (SYNTHROID, LEVOTHROID) 125 MCG tablet Take 125 mcg by mouth every morning. On an empty stomach   . lisinopril (PRINIVIL,ZESTRIL) 2.5 MG tablet take 1 tablet by mouth at bedtime   . nabumetone (RELAFEN) 500 MG tablet Take 500 mg by mouth 2 (two) times daily as needed.   . potassium chloride SA (K-DUR,KLOR-CON) 20 MEQ tablet Take 1 tablet (20 mEq total) by mouth daily.   . simvastatin (ZOCOR) 10 MG tablet Take 10 mg by mouth every evening.    Marland Kitchen spironolactone (ALDACTONE) 25 MG tablet take 1 tablet by mouth once daily   . torsemide (DEMADEX) 20 MG tablet Take 2 tablets (40 mg total) by mouth daily.   Marland Kitchen  zolpidem (AMBIEN) 10 MG tablet Take 10 mg by mouth at bedtime as needed. For sleep   . [DISCONTINUED] albuterol (PROVENTIL HFA;VENTOLIN HFA) 108 (90 BASE) MCG/ACT inhaler Inhale 2 puffs into the lungs every 6 (six) hours as needed (ON HOLD). Wheezing 09/14/2013: hasnt needed recently  . [DISCONTINUED] albuterol (PROVENTIL) (2.5 MG/3ML) 0.083% nebulizer solution Take 2.5 mg by nebulization every 6 (six) hours as needed. Wheezing    . [DISCONTINUED] cyclobenzaprine (FLEXERIL) 10 MG tablet Take 10 mg by mouth 3 (three) times daily as needed for muscle spasms.   . [DISCONTINUED] meloxicam (MOBIC) 15 MG tablet Take 15 mg by mouth daily as needed for pain.   . [DISCONTINUED] methocarbamol (ROBAXIN) 500 MG tablet Take 500 mg by mouth every 6 (six) hours as needed for muscle spasms.   . [DISCONTINUED] methylPREDNISolone (MEDROL DOSEPAK) 4 MG TBPK tablet Take as instructed   . [DISCONTINUED] montelukast (SINGULAIR) 10 MG tablet Take 10 mg by mouth at bedtime.   . [DISCONTINUED] traMADol (ULTRAM) 50 MG tablet Take 50 mg by mouth every 6 (six) hours as needed for moderate pain.    Facility-Administered Encounter Medications as of 07/31/2016  Medication  . triamcinolone acetonide (KENALOG-40) injection 20 mg     Allergies:  Allergies  Allergen Reactions    . Ceftin Anaphylaxis    Face and throat swell   . Shellfish Allergy Other (See Comments)    Gout exacerbation  . Ativan [Lorazepam] Itching  . Allopurinol Nausea Only and Other (See Comments)    weakness  . Lorazepam Itching  . Sulfa Antibiotics Itching  . Ultram [Tramadol Hcl] Itching    Family History: Family History  Problem Relation Age of Onset  . Alzheimer's disease Mother   . CVA Mother   . Hypertension Mother   . Cancer - Colon Father     late 21  . Birth defects Paternal Uncle     Social History: Social History  Substance Use Topics  . Smoking status: Former Smoker    Packs/day: 0.10    Years: 26.00    Types: Cigarettes    Quit date: 05/06/2007  . Smokeless tobacco: Never Used  . Alcohol use No   Social History   Social History Narrative   Tobacco Use Cigarettes: Former Smoker, Quit in 2008   No Alcohol   No recreational drug use   Diet: Regular/Low Carb   Exercise: None   Occupation: disabled   Education: Research officer, political party, masters   Children: 2   Firearms: No   Therapist, art Use: Always   Former Metallurgist.           Review of Systems:  CONSTITUTIONAL: No fevers, chills, night sweats, or weight loss.   EYES: No visual changes or eye pain ENT: No hearing changes.  No history of nose bleeds.   RESPIRATORY: No cough, wheezing and shortness of breath.   CARDIOVASCULAR: Negative for chest pain, and palpitations.   GI: Negative for abdominal discomfort, blood in stools or black stools.  No recent change in bowel habits.   GU:  No history of incontinence.   MUSCLOSKELETAL: +history of joint pain or swelling.  No myalgias.   SKIN: Negative for lesions, rash, and itching.   HEMATOLOGY/ONCOLOGY: Negative for prolonged bleeding, bruising easily, and swollen nodes.  +history of cancer.   ENDOCRINE: Negative for cold or heat intolerance, polydipsia or goiter.   PSYCH:  No depression or anxiety symptoms.   NEURO: As Above.   Vital Signs:  BP 110/70  Pulse  82   Ht 5\' 1"  (1.549 m)   Wt 250 lb 8 oz (113.6 kg)   SpO2 97%   BMI 47.33 kg/m    General Medical Exam:   General:  Well appearing, comfortable, obese.  Marked blunted affect. HEENT:  facial asymmetry due to history of cleft palate surgeries. no carotid bruit CV:  regular rate and rhythm Pulm:  Clear to auscultation Ext:  Pitting edema of the legs bilaterally  Neurological Exam: MENTAL STATUS including orientation to time, place, person, recent and remote memory, attention span and concentration, language, and fund of knowledge is somewhat impaired. Speech is not dysarthric.  Blunted affect.  Montreal Cognitive Assessment  07/31/2016  Visuospatial/ Executive (0/5) 5  Naming (0/3) 3  Attention: Read list of digits (0/2) 2  Attention: Read list of letters (0/1) 1  Attention: Serial 7 subtraction starting at 100 (0/3) 0  Language: Repeat phrase (0/2) 2  Language : Fluency (0/1) 1  Abstraction (0/2) 2  Delayed Recall (0/5) 0  Orientation (0/6) 6  Total 22  Adjusted Score (based on education) 22    CRANIAL NERVES: II:  No visual field defects.  Unremarkable fundi.   III-IV-VI: Pupils equal round and reactive to light.  Normal conjugate, extra-ocular eye movements in all directions of gaze.  No nystagmus.  No ptosis.   V:  Normal facial sensation.    VII:  Right facial asymmetry on the right due to previous surgeries for cleft palate.  Facial muscles intact.  VIII:  Normal hearing and vestibular function.   IX-X:  Normal palatal movement.   XI:  Normal shoulder shrug and head rotation.   XII:  Normal tongue strength and range of motion, no deviation or fasciculation.  Motor:  Motor strength is 5/5 throughout, including distally.  There is no tremor or abnormal movements.  Muscle tone is normal throughout.  Reflexes:  Reflexes are 3+/4 throughout and absent at the Achilles.  Plantars are down going.  Sensation:  Hyperesthesia to pin prick over the feet.  Vibration is reduced to  50% at the ankles bilaterally. Temperature, proprioception, and light touch intact.  Rhomberg is negative.   Gait:  Finger tapping intact.  No dysmetria.  Small, slow steps and unassisted.  She can performed stressed gait, but is very unsteady with tandem gait.  IMPRESSION: 1. Gait abnormality and falls due to imbalance, likely contributed by diabetic neuropathy 2. Morning headaches - new since December 2017.  No prior history of headaches warrants imaging 3. Cognitive impairment with memory loss over the past several years.  4. Generalized hyperreflexia possibly due to cervical canal stenosis.  She denies neck pain or arm paresthesias.  Follow. 5. There are subtle signs of parkinsonism such as masked facies and bradykinesia, but no tremor or rigidity.  I will follow her clinically to see how symptoms evolve  PLAN/RECOMMENDATIONS:  1. Check vitamin B12, B1, copper, SPEP with IFE 2. CT brain wo contrast, unless she has PPM which is MRI compatible 3. Referral to physical therapy for gait training 4. Consider neurocognitive testing going forward  Return to clinic in 3 months.   The duration of this appointment visit was 60 minutes of face-to-face time with the patient.  Greater than 50% of this time was spent in counseling, explanation of diagnosis, planning of further management, and coordination of care.   Thank you for allowing me to participate in patient's care.  If I can answer any additional questions, I would be pleased  to do so.    Sincerely,    Harleigh Civello K. [name], DO

## 2016-08-02 LAB — COPPER, SERUM: Copper: 162 ug/dL (ref 70–175)

## 2016-08-03 NOTE — Progress Notes (Signed)
Note routed

## 2016-08-04 ENCOUNTER — Ambulatory Visit: Payer: Medicare HMO | Admitting: Sports Medicine

## 2016-08-04 LAB — PROTEIN ELECTROPHORESIS, SERUM
Albumin ELP: 3.4 g/dL — ABNORMAL LOW (ref 3.8–4.8)
Alpha-1-Globulin: 0.3 g/dL (ref 0.2–0.3)
Alpha-2-Globulin: 0.8 g/dL (ref 0.5–0.9)
Beta 2: 0.7 g/dL — ABNORMAL HIGH (ref 0.2–0.5)
Beta Globulin: 0.5 g/dL (ref 0.4–0.6)
Gamma Globulin: 0.9 g/dL (ref 0.8–1.7)
Total Protein, Serum Electrophoresis: 6.7 g/dL (ref 6.1–8.1)

## 2016-08-04 LAB — IMMUNOFIXATION ELECTROPHORESIS
IgA: 765 mg/dL — ABNORMAL HIGH (ref 81–463)
IgG (Immunoglobin G), Serum: 1030 mg/dL (ref 694–1618)
IgM, Serum: 29 mg/dL — ABNORMAL LOW (ref 48–271)

## 2016-08-05 LAB — VITAMIN B1: Vitamin B1 (Thiamine): 11 nmol/L (ref 8–30)

## 2016-08-06 ENCOUNTER — Ambulatory Visit (INDEPENDENT_AMBULATORY_CARE_PROVIDER_SITE_OTHER): Payer: Medicare HMO

## 2016-08-06 ENCOUNTER — Telehealth: Payer: Self-pay | Admitting: *Deleted

## 2016-08-06 ENCOUNTER — Telehealth: Payer: Self-pay

## 2016-08-06 DIAGNOSIS — I5022 Chronic systolic (congestive) heart failure: Secondary | ICD-10-CM | POA: Diagnosis not present

## 2016-08-06 DIAGNOSIS — F0632 Mood disorder due to known physiological condition with major depressive-like episode: Secondary | ICD-10-CM | POA: Diagnosis not present

## 2016-08-06 DIAGNOSIS — Z95810 Presence of automatic (implantable) cardiac defibrillator: Secondary | ICD-10-CM

## 2016-08-06 NOTE — Progress Notes (Signed)
EPIC Encounter for ICM Monitoring  Patient Name: Rhonda [name] is a 66 y.o. female Date: 08/06/2016 Primary Care Physican: Jonathon Bellows, MD Primary Cardiologist: [place] Electrophysiologist: Druscilla Brownie Weight:unknown Bi-V Pacing:  99.8%       Attempted call to patient and unable to reach. Transmission reviewed.    Thoracic impedance abnormal suggesting fluid accumulation for last 2 days.  Current prescribed dose of Torsemide 20 mg 2 tablets (40 mg total) daily and Potassium 20 meq 1 tablet daily.  Labs: 10/25/2017Creatinine 1.35, BUN 32, Potassium 4.1, Sodium 139, EGFR 40-48 03/02/2016 Creatinine 1.72, BUN 44, Potassium 4.2, Sodium 135, EGFR 30-35  02/19/2016 Creatinine 1.77, BUN 44, Potassium 4.1, Sodium 136, EGFR 29-34  07/10/2015 Creatinine 1.49, BUN 23, Potassium 4.3, Sodium 140, EGFR 36-42   Recommendations: NONE - Unable to reach patient   Follow-up plan: ICM clinic phone appointment on 08/20/2016.  Copy of ICM check sent to primary cardiologist and device physician.   3 month ICM trend: 08/06/2016   1 Year ICM trend:      [name], RN 08/06/2016 4:54 PM

## 2016-08-06 NOTE — Telephone Encounter (Signed)
Remote ICM transmission received.  Attempted patient call and no answer 

## 2016-08-06 NOTE — Telephone Encounter (Signed)
-----   Message from Alda Berthold, DO sent at 08/06/2016  9:07 AM EDT ----- Please notify patient lab are within normal limits.  Thank you.

## 2016-08-06 NOTE — Telephone Encounter (Signed)
Left message informing patient that her labs are normal.

## 2016-08-07 DIAGNOSIS — R2231 Localized swelling, mass and lump, right upper limb: Secondary | ICD-10-CM | POA: Diagnosis not present

## 2016-08-12 ENCOUNTER — Ambulatory Visit: Payer: Medicare HMO | Admitting: Neurology

## 2016-08-18 DIAGNOSIS — N950 Postmenopausal bleeding: Secondary | ICD-10-CM | POA: Diagnosis not present

## 2016-08-20 ENCOUNTER — Ambulatory Visit (INDEPENDENT_AMBULATORY_CARE_PROVIDER_SITE_OTHER): Payer: Medicare HMO

## 2016-08-20 DIAGNOSIS — Z95810 Presence of automatic (implantable) cardiac defibrillator: Secondary | ICD-10-CM

## 2016-08-20 DIAGNOSIS — I5022 Chronic systolic (congestive) heart failure: Secondary | ICD-10-CM

## 2016-08-20 DIAGNOSIS — R269 Unspecified abnormalities of gait and mobility: Secondary | ICD-10-CM | POA: Diagnosis not present

## 2016-08-20 DIAGNOSIS — R296 Repeated falls: Secondary | ICD-10-CM | POA: Diagnosis not present

## 2016-08-21 NOTE — Progress Notes (Signed)
EPIC Encounter for ICM Monitoring  Patient Name: Rhonda [name] is a 66 y.o. female Date: 08/21/2016 Primary Care Physican: Jonathon Bellows, MD Primary Cardiologist: [place] Electrophysiologist: Druscilla Brownie Weight:unknown Bi-V Pacing:  99.8%                 Heart Failure questions reviewed, pt had sinus infection and feeling full.    Thoracic impedance abnormal suggesting fluid accumulation since 08/04/2016 but is trending back to baseline starting 08/18/2016.  Current prescribed dose of Torsemide 20 mg 2 tablets (40 mg total) daily and Potassium 20 meq 1 tablet daily.   Labs: 10/25/2017Creatinine 1.35, BUN 32, Potassium 4.1, Sodium 139, EGFR 40-48 03/02/2016 Creatinine 1.72, BUN 44, Potassium 4.2, Sodium 135, EGFR 30-35  02/19/2016 Creatinine 1.77, BUN 44, Potassium 4.1, Sodium 136, EGFR 29-34  07/10/2015 Creatinine 1.49, BUN 23, Potassium 4.3, Sodium 140, EGFR 36-42   Recommendations:  Patient had been eating a lot of soup and increased fluids in the last couple of weeks.  She has stopped eating the soups and decreased fluid intake.    Follow-up plan: ICM clinic phone appointment on 09/28/2016.  Encouraged to make her 6 month appointment with Dr Haroldine Laws  Copy of ICM check sent to primary cardiologist and device physician.   3 month ICM trend: 08/21/2016   1 Year ICM trend:      [name], RN 08/21/2016 8:34 AM

## 2016-08-26 DIAGNOSIS — N950 Postmenopausal bleeding: Secondary | ICD-10-CM | POA: Diagnosis not present

## 2016-08-28 DIAGNOSIS — R269 Unspecified abnormalities of gait and mobility: Secondary | ICD-10-CM | POA: Diagnosis not present

## 2016-08-28 DIAGNOSIS — R296 Repeated falls: Secondary | ICD-10-CM | POA: Diagnosis not present

## 2016-08-29 ENCOUNTER — Other Ambulatory Visit (HOSPITAL_COMMUNITY): Payer: Self-pay | Admitting: Adult Health

## 2016-08-31 ENCOUNTER — Other Ambulatory Visit (HOSPITAL_COMMUNITY): Payer: Self-pay | Admitting: Internal Medicine

## 2016-09-01 ENCOUNTER — Ambulatory Visit: Payer: Medicare HMO | Admitting: Sports Medicine

## 2016-09-01 DIAGNOSIS — R296 Repeated falls: Secondary | ICD-10-CM | POA: Diagnosis not present

## 2016-09-01 DIAGNOSIS — R269 Unspecified abnormalities of gait and mobility: Secondary | ICD-10-CM | POA: Diagnosis not present

## 2016-09-08 DIAGNOSIS — R296 Repeated falls: Secondary | ICD-10-CM | POA: Diagnosis not present

## 2016-09-08 DIAGNOSIS — R269 Unspecified abnormalities of gait and mobility: Secondary | ICD-10-CM | POA: Diagnosis not present

## 2016-09-10 DIAGNOSIS — K0510 Chronic gingivitis, plaque induced: Secondary | ICD-10-CM | POA: Diagnosis not present

## 2016-09-10 DIAGNOSIS — J309 Allergic rhinitis, unspecified: Secondary | ICD-10-CM | POA: Diagnosis not present

## 2016-09-10 DIAGNOSIS — Z6841 Body Mass Index (BMI) 40.0 and over, adult: Secondary | ICD-10-CM | POA: Diagnosis not present

## 2016-09-10 DIAGNOSIS — E119 Type 2 diabetes mellitus without complications: Secondary | ICD-10-CM | POA: Diagnosis not present

## 2016-09-10 DIAGNOSIS — J449 Chronic obstructive pulmonary disease, unspecified: Secondary | ICD-10-CM | POA: Diagnosis not present

## 2016-09-11 DIAGNOSIS — R269 Unspecified abnormalities of gait and mobility: Secondary | ICD-10-CM | POA: Diagnosis not present

## 2016-09-11 DIAGNOSIS — R296 Repeated falls: Secondary | ICD-10-CM | POA: Diagnosis not present

## 2016-09-17 DIAGNOSIS — R296 Repeated falls: Secondary | ICD-10-CM | POA: Diagnosis not present

## 2016-09-17 DIAGNOSIS — R269 Unspecified abnormalities of gait and mobility: Secondary | ICD-10-CM | POA: Diagnosis not present

## 2016-09-23 DIAGNOSIS — E785 Hyperlipidemia, unspecified: Secondary | ICD-10-CM | POA: Diagnosis not present

## 2016-09-23 DIAGNOSIS — I5022 Chronic systolic (congestive) heart failure: Secondary | ICD-10-CM | POA: Diagnosis not present

## 2016-09-23 DIAGNOSIS — E039 Hypothyroidism, unspecified: Secondary | ICD-10-CM | POA: Diagnosis not present

## 2016-09-23 DIAGNOSIS — J45909 Unspecified asthma, uncomplicated: Secondary | ICD-10-CM | POA: Diagnosis not present

## 2016-09-23 DIAGNOSIS — I10 Essential (primary) hypertension: Secondary | ICD-10-CM | POA: Diagnosis not present

## 2016-09-23 DIAGNOSIS — E1140 Type 2 diabetes mellitus with diabetic neuropathy, unspecified: Secondary | ICD-10-CM | POA: Diagnosis not present

## 2016-09-23 DIAGNOSIS — F330 Major depressive disorder, recurrent, mild: Secondary | ICD-10-CM | POA: Diagnosis not present

## 2016-09-23 DIAGNOSIS — Z0000 Encounter for general adult medical examination without abnormal findings: Secondary | ICD-10-CM | POA: Diagnosis not present

## 2016-09-23 DIAGNOSIS — I447 Left bundle-branch block, unspecified: Secondary | ICD-10-CM | POA: Diagnosis not present

## 2016-09-28 ENCOUNTER — Ambulatory Visit (INDEPENDENT_AMBULATORY_CARE_PROVIDER_SITE_OTHER): Payer: Medicare HMO | Admitting: *Deleted

## 2016-09-28 DIAGNOSIS — I5022 Chronic systolic (congestive) heart failure: Secondary | ICD-10-CM

## 2016-09-28 DIAGNOSIS — I428 Other cardiomyopathies: Secondary | ICD-10-CM

## 2016-09-28 DIAGNOSIS — Z95810 Presence of automatic (implantable) cardiac defibrillator: Secondary | ICD-10-CM | POA: Diagnosis not present

## 2016-09-28 NOTE — Progress Notes (Signed)
EPIC Encounter for ICM Monitoring  Patient Name: Rhonda [name] is a 66 y.o. female Date: 09/28/2016 Primary Care Physican: Maurice Small, MD Primary Cardiologist: [place] Electrophysiologist: Druscilla Brownie Weight:unknown Bi-V Pacing: 99.8%      Heart Failure questions reviewed, pt asymptomatic.   Thoracic impedance normal.  Prescribed Torsemide 20 mg 2 tablets (40 mg total) every AM and 1 tablet every PM (per med refill 09/01/16).  Potassium 20 meq 1 tablet daily. Metolazone 2.72m 1 tablet by mouth if needed for (FLUID OR EDEMA-per med refill 09/01/16)  Labs: 10/25/2017Creatinine 1.35, BUN 32, Potassium 4.1, Sodium 139, EGFR 40-48 03/02/2016 Creatinine 1.72, BUN 44, Potassium 4.2, Sodium 135, EGFR 30-35  02/19/2016 Creatinine 1.77, BUN 44, Potassium 4.1, Sodium 136, EGFR 29-34  07/10/2015 Creatinine 1.49, BUN 23, Potassium 4.3, Sodium 140, EGFR 36-42   Recommendations: No changes. Discussed to limit salt intake to 2000 mg/day and fluid intake to < 2 liters/day.  Encouraged to call for fluid symptoms or use local ER for any urgent symptoms.  Follow-up plan: ICM clinic phone appointment on 10/29/2016.    Copy of ICM check sent to device physician.   3 month ICM trend: 09/28/2016   1 Year ICM trend:      [name] RN 09/28/2016 10:17 AM

## 2016-09-29 ENCOUNTER — Ambulatory Visit: Payer: Medicare HMO | Admitting: Sports Medicine

## 2016-09-29 DIAGNOSIS — R269 Unspecified abnormalities of gait and mobility: Secondary | ICD-10-CM | POA: Diagnosis not present

## 2016-09-29 DIAGNOSIS — R296 Repeated falls: Secondary | ICD-10-CM | POA: Diagnosis not present

## 2016-09-29 LAB — CUP PACEART REMOTE DEVICE CHECK
Battery Remaining Longevity: 40 mo
Battery Voltage: 2.96 V
Brady Statistic AP VP Percent: 0.03 %
Brady Statistic AP VS Percent: 0.01 %
Brady Statistic AS VP Percent: 99.94 %
Brady Statistic AS VS Percent: 0.02 %
Brady Statistic RA Percent Paced: 0.04 %
Brady Statistic RV Percent Paced: 99.96 %
Date Time Interrogation Session: 20180507073623
HighPow Impedance: 74 Ohm
Implantable Lead Implant Date: 20141210
Implantable Lead Implant Date: 20141210
Implantable Lead Implant Date: 20141210
Implantable Lead Location: 753858
Implantable Lead Location: 753859
Implantable Lead Location: 753860
Implantable Lead Model: 4396
Implantable Lead Model: 5076
Implantable Lead Model: 6935
Implantable Pulse Generator Implant Date: 20141210
Lead Channel Impedance Value: 342 Ohm
Lead Channel Impedance Value: 399 Ohm
Lead Channel Impedance Value: 456 Ohm
Lead Channel Impedance Value: 532 Ohm
Lead Channel Impedance Value: 627 Ohm
Lead Channel Impedance Value: 627 Ohm
Lead Channel Pacing Threshold Amplitude: 0.625 V
Lead Channel Pacing Threshold Amplitude: 0.875 V
Lead Channel Pacing Threshold Amplitude: 1.375 V
Lead Channel Pacing Threshold Pulse Width: 0.4 ms
Lead Channel Pacing Threshold Pulse Width: 0.4 ms
Lead Channel Pacing Threshold Pulse Width: 0.4 ms
Lead Channel Sensing Intrinsic Amplitude: 28.75 mV
Lead Channel Sensing Intrinsic Amplitude: 28.75 mV
Lead Channel Sensing Intrinsic Amplitude: 3.5 mV
Lead Channel Sensing Intrinsic Amplitude: 3.5 mV
Lead Channel Setting Pacing Amplitude: 2 V
Lead Channel Setting Pacing Amplitude: 2.5 V
Lead Channel Setting Pacing Amplitude: 3 V
Lead Channel Setting Pacing Pulse Width: 0.4 ms
Lead Channel Setting Pacing Pulse Width: 0.4 ms
Lead Channel Setting Sensing Sensitivity: 0.3 mV

## 2016-09-29 NOTE — Progress Notes (Signed)
Remote ICD transmission.   

## 2016-10-02 ENCOUNTER — Other Ambulatory Visit (HOSPITAL_COMMUNITY): Payer: Self-pay | Admitting: Internal Medicine

## 2016-10-02 ENCOUNTER — Encounter: Payer: Self-pay | Admitting: Cardiology

## 2016-10-02 DIAGNOSIS — I509 Heart failure, unspecified: Secondary | ICD-10-CM

## 2016-10-06 DIAGNOSIS — F0632 Mood disorder due to known physiological condition with major depressive-like episode: Secondary | ICD-10-CM | POA: Diagnosis not present

## 2016-10-14 DIAGNOSIS — R296 Repeated falls: Secondary | ICD-10-CM | POA: Diagnosis not present

## 2016-10-14 DIAGNOSIS — R269 Unspecified abnormalities of gait and mobility: Secondary | ICD-10-CM | POA: Diagnosis not present

## 2016-10-16 DIAGNOSIS — R296 Repeated falls: Secondary | ICD-10-CM | POA: Diagnosis not present

## 2016-10-16 DIAGNOSIS — R269 Unspecified abnormalities of gait and mobility: Secondary | ICD-10-CM | POA: Diagnosis not present

## 2016-10-29 ENCOUNTER — Ambulatory Visit (INDEPENDENT_AMBULATORY_CARE_PROVIDER_SITE_OTHER): Payer: Medicare HMO

## 2016-10-29 DIAGNOSIS — I5022 Chronic systolic (congestive) heart failure: Secondary | ICD-10-CM

## 2016-10-29 DIAGNOSIS — Z95810 Presence of automatic (implantable) cardiac defibrillator: Secondary | ICD-10-CM | POA: Diagnosis not present

## 2016-10-29 NOTE — Progress Notes (Signed)
EPIC Encounter for ICM Monitoring  Patient Name: Rhonda [name] is a 66 y.o. female Date: 10/29/2016 Primary Care Physican: Webb, Carol, MD Primary Cardiologist: [place] Electrophysiologist: Taylor Dry Weight:unknown Bi-V Pacing: 99.8%                                                     Heart Failure questions reviewed, pt asymptomatic.   Thoracic impedance abnormal suggesting fluid accumulation.  Prescribed Torsemide 20 mg 2 tablets (40 mg total) every AM and 1 tablet (20 mg total) every PM.  Potassium 20 meq 1 tablet daily. Metolazone 2.5mg 1 tablet by mouth if needed for (FLUID OR EDEMA-per med refill 09/01/16)  Labs: 10/25/2017Creatinine 1.35, BUN 32, Potassium 4.1, Sodium 139, EGFR 40-48 03/02/2016 Creatinine 1.72, BUN 44, Potassium 4.2, Sodium 135, EGFR 30-35  02/19/2016 Creatinine 1.77, BUN 44, Potassium 4.1, Sodium 136, EGFR 29-34  07/10/2015 Creatinine 1.49, BUN 23, Potassium 4.3, Sodium 140, EGFR 36-42    Recommendations: No changes. Advised to limit salt intake to 2000 mg daily.  Advised to take Metolazone for any fluid symptoms as prescribed.     Encouraged to call for fluid symptoms.   Follow-up plan: ICM clinic phone appointment on 12/01/2016.  Office appointment scheduled on 12/17/2016 with Dr. McLean.  Copy of ICM check sent to primary cardiologist and device physician.   3 month ICM trend: 10/29/2016   1 Year ICM trend:       S , RN 10/29/2016 7:49 AM    

## 2016-11-01 ENCOUNTER — Other Ambulatory Visit (HOSPITAL_COMMUNITY): Payer: Self-pay | Admitting: Cardiology

## 2016-11-04 ENCOUNTER — Encounter: Payer: Self-pay | Admitting: Neurology

## 2016-11-04 ENCOUNTER — Ambulatory Visit (INDEPENDENT_AMBULATORY_CARE_PROVIDER_SITE_OTHER): Payer: Medicare HMO | Admitting: Neurology

## 2016-11-04 VITALS — BP 110/70 | HR 81 | Ht 61.0 in | Wt 238.5 lb

## 2016-11-04 DIAGNOSIS — E1140 Type 2 diabetes mellitus with diabetic neuropathy, unspecified: Secondary | ICD-10-CM | POA: Diagnosis not present

## 2016-11-04 DIAGNOSIS — R413 Other amnesia: Secondary | ICD-10-CM

## 2016-11-04 NOTE — Patient Instructions (Addendum)
Patients age 64+:  You have been referred for a neurocognitive evaluation in our office.   The evaluation takes approximately two hours. The first part of the appointment is a clinical interview with the neuropsychologist (Dr. Macarthur Critchley). Please bring someone with you to this appointment if possible, as it is helpful for Dr. Si Raider to hear from both you and another adult who knows you well. After speaking with Dr. Si Raider, you will complete testing with her technician. The testing includes a variety of tasks- mostly question-and-answer, some paper-and-pencil. There is nothing you need to do to prepare for this appointment, but having a good night's sleep prior to the testing, and bringing eyeglasses and hearing aids (if you wear them), is advised.   About a week after the evaluation, you will return to follow up with Dr. Si Raider to review the test results. This appointment is about 30 minutes. If you would like a family member to receive this information as well, please bring them to the appointment.   We have to reserve several hours of the neuropsychologist's time and the psychometrician's time for your evaluation appointment. As such, please note that there is a No-Show fee of $100. If you are unable to attend any of your appointments, please contact our office as soon as possible to reschedule.  Recommend restricting driving to local distances only.  Avoid nighttime driving and high traffic areas  Return to clinic in 6 months
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
## 2016-11-05 DIAGNOSIS — R921 Mammographic calcification found on diagnostic imaging of breast: Secondary | ICD-10-CM | POA: Diagnosis not present

## 2016-11-19 ENCOUNTER — Ambulatory Visit (HOSPITAL_COMMUNITY): Payer: Medicare HMO

## 2016-12-01 ENCOUNTER — Telehealth: Payer: Self-pay

## 2016-12-01 ENCOUNTER — Ambulatory Visit (INDEPENDENT_AMBULATORY_CARE_PROVIDER_SITE_OTHER): Payer: Medicare HMO

## 2016-12-01 ENCOUNTER — Telehealth: Payer: Self-pay | Admitting: Cardiology

## 2016-12-01 DIAGNOSIS — Z95810 Presence of automatic (implantable) cardiac defibrillator: Secondary | ICD-10-CM | POA: Diagnosis not present

## 2016-12-01 DIAGNOSIS — I5022 Chronic systolic (congestive) heart failure: Secondary | ICD-10-CM

## 2016-12-01 NOTE — Progress Notes (Signed)
EPIC Encounter for ICM Monitoring  Patient Name: Rhonda [name] is a 66 y.o. female Date: 12/01/2016 Primary Care Physican: Webb, Carol, MD Primary Cardiologist: [place] Electrophysiologist: Taylor Dry Weight:unknown Bi-V Pacing: 99.8%       Attempted call to patient and unable to reach.   Transmission reviewed.    Thoracic impedance normal but was abnormal suggesting fluid accumulation 11/10/2016 to 11/26/2016.  Prescribed Torsemide 20 mg 2 tablets (40 mg total) every AM and 1 tablet (20 mg total) every PM. Potassium 20 meq 1 tablet daily. Metolazone 2.5mg 1 tablet by mouth if needed for   Labs: 10/25/2017Creatinine 1.35, BUN 32, Potassium 4.1, Sodium 139, EGFR 40-48 03/02/2016 Creatinine 1.72, BUN 44, Potassium 4.2, Sodium 135, EGFR 30-35  02/19/2016 Creatinine 1.77, BUN 44, Potassium 4.1, Sodium 136, EGFR 29-34  07/10/2015 Creatinine 1.49, BUN 23, Potassium 4.3, Sodium 140, EGFR 36-42   Recommendations: NONE - Unable to reach patient   Follow-up plan: ICM clinic phone appointment on 01/04/2017.  Office appointment scheduled 12/17/2016 with Dr. McLean.  Copy of ICM check sent to device physician.   3 month ICM trend: 12/01/2016   1 Year ICM trend:       S , RN 12/01/2016 1:29 PM    

## 2016-12-01 NOTE — Telephone Encounter (Signed)
Remote ICM transmission received.  Attempted patient call and no answer 

## 2016-12-01 NOTE — Telephone Encounter (Signed)
Spoke with pt and reminded pt of remote transmission that is due today. Pt verbalized understanding.   

## 2016-12-02 ENCOUNTER — Ambulatory Visit (HOSPITAL_COMMUNITY)
Admission: RE | Admit: 2016-12-02 | Discharge: 2016-12-02 | Disposition: A | Discharge status: Home / Self Care | Payer: Medicare HMO | Source: Ambulatory Visit | Attending: Internal Medicine | Admitting: Internal Medicine

## 2016-12-02 DIAGNOSIS — I5030 Unspecified diastolic (congestive) heart failure: Secondary | ICD-10-CM | POA: Diagnosis not present

## 2016-12-02 DIAGNOSIS — I5022 Chronic systolic (congestive) heart failure: Secondary | ICD-10-CM | POA: Diagnosis not present

## 2016-12-02 DIAGNOSIS — I071 Rheumatic tricuspid insufficiency: Secondary | ICD-10-CM | POA: Insufficient documentation

## 2016-12-02 LAB — ECHOCARDIOGRAM COMPLETE
E decel time: 211 msec
E/e' ratio: 12.44
FS: 18 % — AB (ref 28–44)
IVS/LV PW RATIO, ED: 0.94
LA ID, A-P, ES: 40 mm
LA diam end sys: 40 mm
LA diam index: 1.8 cm/m2
LA vol A4C: 60.9 ml
LA vol index: 26.1 mL/m2
LA vol: 58 mL
LV E/e' medial: 12.44
LV E/e'average: 12.44
LV PW d: 6.78 mm — AB (ref 0.6–1.1)
LV e' LATERAL: 8.76 cm/s
LVOT SV: 76 mL
LVOT VTI: 24.1 cm
LVOT area: 3.14 cm2
LVOT diameter: 20 mm
LVOT peak grad rest: 5 mmHg
LVOT peak vel: 115 cm/s
Lateral S' vel: 11.1 cm/s
MV Dec: 211
MV Peak grad: 5 mmHg
MV pk A vel: 112 m/s
MV pk E vel: 109 m/s
RV sys press: 34 mmHg
Reg peak vel: 279 cm/s
TAPSE: 22.6 mm
TDI e' lateral: 8.76
TDI e' medial: 8.6
TR max vel: 279 cm/s

## 2016-12-02 NOTE — Progress Notes (Signed)
  Echocardiogram 2D Echocardiogram has been performed.  Rhonda [name] 12/02/2016, 2:11 PM

## 2016-12-03 DIAGNOSIS — F3289 Other specified depressive episodes: Secondary | ICD-10-CM | POA: Diagnosis not present

## 2016-12-03 DIAGNOSIS — F0632 Mood disorder due to known physiological condition with major depressive-like episode: Secondary | ICD-10-CM | POA: Diagnosis not present

## 2016-12-08 ENCOUNTER — Other Ambulatory Visit (HOSPITAL_COMMUNITY): Payer: Self-pay | Admitting: Internal Medicine

## 2016-12-08 DIAGNOSIS — Q359 Cleft palate, unspecified: Secondary | ICD-10-CM | POA: Diagnosis not present

## 2016-12-17 ENCOUNTER — Ambulatory Visit (HOSPITAL_COMMUNITY)
Admission: RE | Admit: 2016-12-17 | Discharge: 2016-12-17 | Disposition: A | Discharge status: Home / Self Care | Payer: Medicare HMO | Source: Ambulatory Visit | Attending: Cardiology | Admitting: Cardiology

## 2016-12-17 ENCOUNTER — Encounter (HOSPITAL_COMMUNITY): Payer: Self-pay | Admitting: Cardiology

## 2016-12-17 VITALS — BP 120/88 | HR 85 | Wt 234.4 lb

## 2016-12-17 DIAGNOSIS — Z87730 Personal history of (corrected) cleft lip and palate: Secondary | ICD-10-CM | POA: Insufficient documentation

## 2016-12-17 DIAGNOSIS — K219 Gastro-esophageal reflux disease without esophagitis: Secondary | ICD-10-CM | POA: Insufficient documentation

## 2016-12-17 DIAGNOSIS — Z79899 Other long term (current) drug therapy: Secondary | ICD-10-CM | POA: Diagnosis not present

## 2016-12-17 DIAGNOSIS — I428 Other cardiomyopathies: Secondary | ICD-10-CM | POA: Insufficient documentation

## 2016-12-17 DIAGNOSIS — M109 Gout, unspecified: Secondary | ICD-10-CM | POA: Insufficient documentation

## 2016-12-17 DIAGNOSIS — Z9981 Dependence on supplemental oxygen: Secondary | ICD-10-CM | POA: Insufficient documentation

## 2016-12-17 DIAGNOSIS — Z9221 Personal history of antineoplastic chemotherapy: Secondary | ICD-10-CM | POA: Diagnosis not present

## 2016-12-17 DIAGNOSIS — I110 Hypertensive heart disease with heart failure: Secondary | ICD-10-CM | POA: Diagnosis not present

## 2016-12-17 DIAGNOSIS — M1711 Unilateral primary osteoarthritis, right knee: Secondary | ICD-10-CM | POA: Diagnosis not present

## 2016-12-17 DIAGNOSIS — F419 Anxiety disorder, unspecified: Secondary | ICD-10-CM | POA: Diagnosis not present

## 2016-12-17 DIAGNOSIS — I447 Left bundle-branch block, unspecified: Secondary | ICD-10-CM | POA: Diagnosis not present

## 2016-12-17 DIAGNOSIS — J45909 Unspecified asthma, uncomplicated: Secondary | ICD-10-CM | POA: Insufficient documentation

## 2016-12-17 DIAGNOSIS — E669 Obesity, unspecified: Secondary | ICD-10-CM | POA: Diagnosis not present

## 2016-12-17 DIAGNOSIS — I5022 Chronic systolic (congestive) heart failure: Secondary | ICD-10-CM | POA: Insufficient documentation

## 2016-12-17 DIAGNOSIS — Z6841 Body Mass Index (BMI) 40.0 and over, adult: Secondary | ICD-10-CM | POA: Insufficient documentation

## 2016-12-17 DIAGNOSIS — Z853 Personal history of malignant neoplasm of breast: Secondary | ICD-10-CM | POA: Diagnosis not present

## 2016-12-17 DIAGNOSIS — Z923 Personal history of irradiation: Secondary | ICD-10-CM | POA: Insufficient documentation

## 2016-12-17 DIAGNOSIS — E1142 Type 2 diabetes mellitus with diabetic polyneuropathy: Secondary | ICD-10-CM | POA: Insufficient documentation

## 2016-12-17 DIAGNOSIS — F329 Major depressive disorder, single episode, unspecified: Secondary | ICD-10-CM | POA: Insufficient documentation

## 2016-12-17 DIAGNOSIS — E039 Hypothyroidism, unspecified: Secondary | ICD-10-CM | POA: Diagnosis not present

## 2016-12-17 DIAGNOSIS — E785 Hyperlipidemia, unspecified: Secondary | ICD-10-CM | POA: Diagnosis not present

## 2016-12-17 DIAGNOSIS — J9610 Chronic respiratory failure, unspecified whether with hypoxia or hypercapnia: Secondary | ICD-10-CM | POA: Diagnosis not present

## 2016-12-17 DIAGNOSIS — Z95810 Presence of automatic (implantable) cardiac defibrillator: Secondary | ICD-10-CM | POA: Diagnosis not present

## 2016-12-17 DIAGNOSIS — E559 Vitamin D deficiency, unspecified: Secondary | ICD-10-CM | POA: Insufficient documentation

## 2016-12-17 LAB — BASIC METABOLIC PANEL
Anion gap: 9 (ref 5–15)
BUN: 24 mg/dL — ABNORMAL HIGH (ref 6–20)
CO2: 30 mmol/L (ref 22–32)
Calcium: 8.9 mg/dL (ref 8.9–10.3)
Chloride: 99 mmol/L — ABNORMAL LOW (ref 101–111)
Creatinine, Ser: 1.42 mg/dL — ABNORMAL HIGH (ref 0.44–1.00)
GFR calc Af Amer: 44 mL/min — ABNORMAL LOW (ref >60)
GFR calc non Af Amer: 38 mL/min — ABNORMAL LOW (ref >60)
Glucose, Bld: 90 mg/dL (ref 65–99)
Potassium: 4.1 mmol/L (ref 3.5–5.1)
Sodium: 138 mmol/L (ref 135–145)

## 2016-12-17 LAB — BRAIN NATRIURETIC PEPTIDE: B Natriuretic Peptide: 83 pg/mL (ref 0.0–100.0)

## 2016-12-17 NOTE — Patient Instructions (Addendum)
Labs today (will call for abnormal results, otherwise no news is good news)  Take Torsemide 40 mg (2 Tablets) Twice Daily for 3 days only, then resume current dose.   Follow up in 6 Months, we will call you to schedule appointment.

## 2016-12-17 NOTE — Progress Notes (Signed)
Patient ID: Rhonda [name], female   DOB: March 24, 1951, 66 y.o.   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 She has had sleep study with no  evidence of sleep apnea.  She has a Medtronic CRT-D device.  Last echo in 7/18 showed recovery of EF to 55-60%.   Follow up for Heart Failure:  She has been going to the [facility] regularly.  Weight is down 21 lbs.  She is short of breath after walking about 2-3 blocks or walking for a while in [employer].  No chest pain.  No orthopnea/PND.  Trouble with depression, no taking Effexor.    Optivol reviewed: fluid index > threshold with decreased impedance.   10/14/12 ECHO EF 35% RV ok  04/12/13 ECHO EF 20-25%, LV moderately dilated 10/11/2013 ECHO EF 45-50% 7/18 ECHO EF 55-60%, normal RV size and systolic function, PASP 34 mmHg  11/24/12 Creatinine 1.23 Potassium 4.1 12/15/12 Creatinine 2.03 Potassium 3.3  12/22/12 Creatinine 1.2 Potassium 3.8 02/23/13 Creatinine 1.68 K 4.4  05/02/13 K 4.2 Creatinine 1.8  10/11/13 K 3.7 Creatinine 1.69 10/17 K 4.1, creatinine 1.35  ROS: All systems negative ex cept as listed in HPI, PMH and Problem List.  Past Medical History:  Diagnosis Date  . Anemia   . Anxiety   . Arthritis    RIGHT KNEE ARTHRITIS AND PAIN-PT TOLD BONE ON BONE  . Asthma   . Back pain    DISK PROBLEM  . Breast cancer, stage 1 (Rhonda [name]) 03/26/2011   left-FINISHED CHEMO  AND RADIATION  . Cardiomyopathy    PT'S CARDIOLOGIST IS Rhonda. Tressia Miners [name]  . Chronic systolic heart  failure (HCC)    a) NICM b) ECHO (03/2013) EF 20-25% c) ECHO (09/2013) EF 45-50%, grade I DD  . Complication of anesthesia    PT STATES HER B/P LOW AFTER ONE OF HER SURGERIES--SHE ATTRIBUTES TO LYING FLAT  . Depression   . Diabetes mellitus    "diet controlled" (05/04/2103)  . Exertional shortness of breath   . GERD (gastroesophageal reflux disease)   . Gout   . Heart murmur   . Hyperlipidemia   . Hypertension   . Hypothyroidism   . Left bundle branch block    s/p CRT-D (04/2013)  . Migraines   . On home oxygen therapy    "2L suppose to be q night" (05/03/2013)  . Peripheral neuropathy    feet  . SVD (spontaneous vaginal delivery)    x 2  . Unspecified vitamin D deficiency 03/26/2011   does not take meds    Current Outpatient Prescriptions  Medication Sig Dispense Refill  . buPROPion (WELLBUTRIN XL) 150 MG 24 hr tablet Take 450 mg by mouth every morning.     . carvedilol (COREG) 6.25 MG tablet take 1 tablet by mouth every morning and 2 tablets every evening 90 tablet 3  . clonazePAM (KLONOPIN) 0.5 MG tablet Take 0.5 mg by mouth 2 (two) times daily as needed for anxiety. 1 in morning, 1 at bedtime    .  febuxostat (ULORIC) 40 MG tablet Take 40 mg by mouth at bedtime.     . fexofenadine (ALLEGRA) 180 MG tablet take 1 tablet by mouth once daily if needed  0  . gabapentin (NEURONTIN) 300 MG capsule Take 300 mg by mouth 3 (three) times daily.     Marland Kitchen gabapentin (NEURONTIN) 800 MG tablet   1  . ibuprofen (ADVIL,MOTRIN) 800 MG tablet Take 800 mg by mouth 3 (three) times daily as needed for mild pain.    Marland Kitchen levothyroxine (SYNTHROID, LEVOTHROID) 125 MCG tablet Take 125 mcg by mouth every morning. On an empty stomach    . montelukast (SINGULAIR) 10 MG tablet   1  . nabumetone (RELAFEN) 500 MG tablet Take 500 mg by mouth 2 (two) times daily as needed.    . potassium chloride SA (K-DUR,KLOR-CON) 20 MEQ tablet Take 1 tablet (20 mEq total) by mouth daily. 90 tablet 6  . simvastatin (ZOCOR) 10 MG  tablet Take 10 mg by mouth every evening.     Marland Kitchen spironolactone (ALDACTONE) 25 MG tablet take 1 tablet by mouth once daily 30 tablet 3  . torsemide (DEMADEX) 20 MG tablet TAKE 2 TABLETS BY MOUTH IN MORNING AND 1 TABLET IN EVENING 90 tablet 3  . venlafaxine XR (EFFEXOR-XR) 75 MG 24 hr capsule Take 75 mg by mouth every morning.  0  . zolpidem (AMBIEN) 10 MG tablet Take 10 mg by mouth at bedtime as needed. For sleep    . ARIPiprazole (ABILIFY) 2 MG tablet Take 2 mg by mouth every morning.     . metolazone (ZAROXOLYN) 2.5 MG tablet take 1 tablet by mouth if needed for FLUID OR EDEMA (Patient not taking: Reported on 12/17/2016) 30 tablet 2  . traMADol (ULTRAM) 50 MG tablet   0   Current Facility-Administered Medications  Medication Dose Route Frequency Provider Last Rate Last Dose  . triamcinolone acetonide (KENALOG-40) injection 20 mg  20 mg Other Once Rhonda [name], Rhonda [name]        Vitals:   12/17/16 1010  BP: 120/88  Pulse: 85  SpO2: 97%  Weight: 234 lb 6.4 oz (106.3 kg)    PHYSICAL EXAM: General: NAD Neck: No JVD, no thyromegaly or thyroid nodule.  Lungs: Clear to auscultation bilaterally with normal respiratory effort. CV: Nondisplaced PMI.  Heart regular S1/S2, no S3/S4, no murmur.  1+ ankle edema.  No carotid bruit.  Normal pedal pulses.  Abdomen: Soft, nontender, no hepatosplenomegaly, no distention.  Skin: Intact without lesions or rashes.  Neurologic: Alert and oriented x 3. Left facial droop Psych: Normal affect. Extremities: No clubbing or cyanosis.  HEENT: Normal.    ASSESSMENT & PLAN:  1) Chronic systolic HF: NICM s/p Medtronic CRT-D, ?Viral myocarditis. Cardiomyopathy dates from 20. EF 45-50% (09/2013), EF up to 55-60% on 7/18 echo.  NYHA class II.  On exam, she does not look particularly volume overloaded but Optivol suggests excess volume.  - Increase torsemide to 40 mg bid x 3 days then go back to 40 qam/20 qpm after that.  BMET/BNP today.   - Continue coreg 6.25 mg q  am and 12.5 mg q pm.   - She has been off lisionopril for a while.  With recovery of EF, would not push her to restart.   - Continue 25 mg spironolactone daily.    2) Obesity She is steadily losing weight.   3) HTN BP controlled, no changes.   Followup in 6 months.   Loralie Champagne 12/17/2016

## 2017-01-01 ENCOUNTER — Other Ambulatory Visit: Payer: Self-pay

## 2017-01-04 ENCOUNTER — Ambulatory Visit (INDEPENDENT_AMBULATORY_CARE_PROVIDER_SITE_OTHER): Payer: Medicare HMO | Admitting: *Deleted

## 2017-01-04 DIAGNOSIS — I5022 Chronic systolic (congestive) heart failure: Secondary | ICD-10-CM

## 2017-01-04 DIAGNOSIS — I428 Other cardiomyopathies: Secondary | ICD-10-CM | POA: Diagnosis not present

## 2017-01-04 DIAGNOSIS — Z95810 Presence of automatic (implantable) cardiac defibrillator: Secondary | ICD-10-CM

## 2017-01-04 NOTE — Progress Notes (Signed)
ICD Remote Transmission  

## 2017-01-04 NOTE — Progress Notes (Signed)
EPIC Encounter for ICM Monitoring  Patient Name: Rhonda [name] is a 66 y.o. female Date: 01/04/2017 Primary Care Physican: Maurice Small, MD Primary Cardiologist: [place] Electrophysiologist: Druscilla Brownie Weight:unknown Bi-V Pacing: 99.9%      Heart Failure questions reviewed, pt asymptomatic.   Thoracic impedance at normal since Torsemide was increased x 3 days at office visit with Dr Aundra Dubin on 7/26.   Has been abnormal suggesting fluid accumulation since mid June until 8/8.  Prescribed dosage: Torsemide 20 mg 2 tablets (40 mg total) every AM and 1 tablet (20 mg total)every PM. Potassium 20 meq 1 tablet daily. Metolazone 2.73m 1 tablet by mouth if needed for fluid or edema  Labs: 10/25/2017Creatinine 1.35, BUN 32, Potassium 4.1, Sodium 139, EGFR 40-48 03/02/2016 Creatinine 1.72, BUN 44, Potassium 4.2, Sodium 135, EGFR 30-35  02/19/2016 Creatinine 1.77, BUN 44, Potassium 4.1, Sodium 136, EGFR 29-34  07/10/2015 Creatinine 1.49, BUN 23, Potassium 4.3, Sodium 140, EGFR 36-42  Recommendations: No changes.  Reviewed at length how to read food and water labels and advised to limit salt to 2000 mg a day and liquid to 64 oz a day.  Patient has very difficulty adding and multiplying.  She has a family member that can assist her.    Follow-up plan: ICM clinic phone appointment on 02/04/2017.   Copy of ICM check sent to device physician.   3 month ICM trend: 01/04/2017   1 Year ICM trend:      [name] RN 01/04/2017 12:46 PM

## 2017-01-05 LAB — CUP PACEART REMOTE DEVICE CHECK
Battery Remaining Longevity: 35 mo
Battery Voltage: 2.95 V
Brady Statistic AP VP Percent: 0.44 %
Brady Statistic AP VS Percent: 0.01 %
Brady Statistic AS VP Percent: 99.54 %
Brady Statistic AS VS Percent: 0.01 %
Brady Statistic RA Percent Paced: 0.45 %
Brady Statistic RV Percent Paced: 99.93 %
Date Time Interrogation Session: 20180813083825
HighPow Impedance: 79 Ohm
Implantable Lead Implant Date: 20141210
Implantable Lead Implant Date: 20141210
Implantable Lead Implant Date: 20141210
Implantable Lead Location: 753858
Implantable Lead Location: 753859
Implantable Lead Location: 753860
Implantable Lead Model: 4396
Implantable Lead Model: 5076
Implantable Lead Model: 6935
Implantable Pulse Generator Implant Date: 20141210
Lead Channel Impedance Value: 342 Ohm
Lead Channel Impedance Value: 380 Ohm
Lead Channel Impedance Value: 456 Ohm
Lead Channel Impedance Value: 532 Ohm
Lead Channel Impedance Value: 627 Ohm
Lead Channel Impedance Value: 627 Ohm
Lead Channel Pacing Threshold Amplitude: 0.625 V
Lead Channel Pacing Threshold Amplitude: 0.875 V
Lead Channel Pacing Threshold Amplitude: 1.375 V
Lead Channel Pacing Threshold Pulse Width: 0.4 ms
Lead Channel Pacing Threshold Pulse Width: 0.4 ms
Lead Channel Pacing Threshold Pulse Width: 0.4 ms
Lead Channel Sensing Intrinsic Amplitude: 22.875 mV
Lead Channel Sensing Intrinsic Amplitude: 22.875 mV
Lead Channel Sensing Intrinsic Amplitude: 3 mV
Lead Channel Sensing Intrinsic Amplitude: 3 mV
Lead Channel Setting Pacing Amplitude: 2 V
Lead Channel Setting Pacing Amplitude: 2.5 V
Lead Channel Setting Pacing Amplitude: 2.75 V
Lead Channel Setting Pacing Pulse Width: 0.4 ms
Lead Channel Setting Pacing Pulse Width: 0.4 ms
Lead Channel Setting Sensing Sensitivity: 0.3 mV

## 2017-01-07 ENCOUNTER — Telehealth (HOSPITAL_COMMUNITY): Payer: Self-pay | Admitting: *Deleted

## 2017-01-07 NOTE — Telephone Encounter (Signed)
Pt called earlier this AM to c/o increased swelling in feet/ankles, she states swelling goes up about half way to her knees.  Pt states if she presses on her LE it takes a little longer for the indention to go away.  She states wt was 235 lb today and was 239 lbs yesterday but unsure how accurate wts are, she was 234 lb on our scales at last visit and Torsemide was increased to 40 mg in AM and 20 mg in PM which she states she is taking.  She does have Metolazone prn at home as well.  Advised pt to go ahead and take a dose of Metolazone, keep legs elevated when possible and let us know if this does not help.  Pt is aware and agreeable.

## 2017-01-14 ENCOUNTER — Encounter: Payer: Self-pay | Admitting: Cardiology

## 2017-01-29 ENCOUNTER — Telehealth: Payer: Self-pay | Admitting: Neurology

## 2017-01-29 NOTE — Telephone Encounter (Signed)
Called patient back and left message that I would let Dr. [name] know about the falls and requested for her to call me back if she needed anything else.

## 2017-01-29 NOTE — Telephone Encounter (Signed)
Patient is wanting to let you know that she has fell 3 times

## 2017-01-31 ENCOUNTER — Other Ambulatory Visit (HOSPITAL_COMMUNITY): Payer: Self-pay | Admitting: Internal Medicine

## 2017-01-31 DIAGNOSIS — I509 Heart failure, unspecified: Secondary | ICD-10-CM

## 2017-02-02 ENCOUNTER — Encounter: Payer: Medicare HMO | Admitting: Psychology

## 2017-02-04 ENCOUNTER — Telehealth: Payer: Self-pay

## 2017-02-04 ENCOUNTER — Ambulatory Visit (INDEPENDENT_AMBULATORY_CARE_PROVIDER_SITE_OTHER): Payer: Medicare HMO

## 2017-02-04 DIAGNOSIS — Z95810 Presence of automatic (implantable) cardiac defibrillator: Secondary | ICD-10-CM | POA: Diagnosis not present

## 2017-02-04 DIAGNOSIS — I5022 Chronic systolic (congestive) heart failure: Secondary | ICD-10-CM

## 2017-02-04 NOTE — Telephone Encounter (Signed)
Remote ICM transmission received.  Attempted call to patient and left detailed message regarding transmission and next ICM scheduled for 03/08/2017.  Advised to return call for any fluid symptoms or questions.    

## 2017-02-04 NOTE — Progress Notes (Signed)
EPIC Encounter for ICM Monitoring  Patient Name: Rhonda [name] is a 66 y.o. female Date: 02/04/2017 Primary Care Physican: Maurice Small, MD Primary Cardiologist: [place] Electrophysiologist: Druscilla Brownie Weight:unknown Bi-V Pacing: 99.9%        Attempted call to patient and unable to reach.  Left detailed message regarding transmission.  Transmission reviewed.    Thoracic impedance normal since 01/28/2017 but was abnormal suggesting fluid accumulation prior to 9/6.  Prescribed dosage: Torsemide 20 mg 2 tablets (40 mg total) every AM and 1 tablet (20 mg total)every PM. Potassium 20 meq 1 tablet daily. Metolazone 2.25m 1 tablet by mouth if needed for fluid or edema  Labs: 10/25/2017Creatinine 1.35, BUN 32, Potassium 4.1, Sodium 139, EGFR 40-48 03/02/2016 Creatinine 1.72, BUN 44, Potassium 4.2, Sodium 135, EGFR 30-35  02/19/2016 Creatinine 1.77, BUN 44, Potassium 4.1, Sodium 136, EGFR 29-34  07/10/2015 Creatinine 1.49, BUN 23, Potassium 4.3, Sodium 140, EGFR 36-42  Recommendations: Left voice mail with ICM number and encouraged to call for fluid symptoms.  Follow-up plan: ICM clinic phone appointment on 03/08/2017.    Copy of ICM check sent to Dr. TLovena Le   3 month ICM trend: 02/04/2017   1 Year ICM trend:      [name] RN 02/04/2017 2:22 PM

## 2017-02-22 ENCOUNTER — Encounter: Payer: Medicare HMO | Admitting: Psychology

## 2017-03-03 DIAGNOSIS — F0632 Mood disorder due to known physiological condition with major depressive-like episode: Secondary | ICD-10-CM | POA: Diagnosis not present

## 2017-03-03 DIAGNOSIS — F3289 Other specified depressive episodes: Secondary | ICD-10-CM | POA: Diagnosis not present

## 2017-03-04 DIAGNOSIS — N958 Other specified menopausal and perimenopausal disorders: Secondary | ICD-10-CM | POA: Diagnosis not present

## 2017-03-04 DIAGNOSIS — Z01419 Encounter for gynecological examination (general) (routine) without abnormal findings: Secondary | ICD-10-CM | POA: Diagnosis not present

## 2017-03-04 DIAGNOSIS — Z6841 Body Mass Index (BMI) 40.0 and over, adult: Secondary | ICD-10-CM | POA: Diagnosis not present

## 2017-03-08 ENCOUNTER — Ambulatory Visit (INDEPENDENT_AMBULATORY_CARE_PROVIDER_SITE_OTHER): Payer: Medicare HMO

## 2017-03-08 DIAGNOSIS — I5022 Chronic systolic (congestive) heart failure: Secondary | ICD-10-CM

## 2017-03-08 DIAGNOSIS — Z95810 Presence of automatic (implantable) cardiac defibrillator: Secondary | ICD-10-CM | POA: Diagnosis not present

## 2017-03-08 NOTE — Progress Notes (Signed)
EPIC Encounter for ICM Monitoring  Patient Name: Rhonda [name] is a 66 y.o. female Date: 03/08/2017 Primary Care Physican: Maurice Small, MD Primary Cardiologist: [place] Electrophysiologist: Lovena Le Dry Weight: does not weigh Bi-V Pacing: 100%      Heart Failure questions reviewed, pt symptomatic with swollen ankles.   Thoracic impedance abnormal suggesting fluid accumulation since 02/08/2017.  Patient is eating fast food daily and not cooking at home.   Prescribed dosage: Torsemide 20 mg 2 tablets (40 mg total) every AM and 1 tablet (20 mg total)every PM. Potassium 20 meq 1 tablet daily.  Metolazone 2.16m 1 tablet by mouth if needed for fluid or edema.  Labs: 10/25/2017Creatinine 1.35, BUN 32, Potassium 4.1, Sodium 139, EGFR 40-48 03/02/2016 Creatinine 1.72, BUN 44, Potassium 4.2, Sodium 135, EGFR 30-35  02/19/2016 Creatinine 1.77, BUN 44, Potassium 4.1, Sodium 136, EGFR 29-34  07/10/2015 Creatinine 1.49, BUN 23, Potassium 4.3, Sodium 140, EGFR 36-42  Recommendations: Advised to follow directions for Metolazone for fluid and she agreed to take tomorrow.  Advised to weigh daily.  Encouraged to stop eating fast foods to help decrease salt intake.   Follow-up plan: ICM clinic phone appointment on 03/11/2017 to recheck fluid levels.    Copy of ICM check sent to Dr. TLovena Leand Dr. BHaroldine Laws   3 month ICM trend: 03/08/2017   1 Year ICM trend:      [name] RN 03/08/2017 10:10 AM

## 2017-03-09 ENCOUNTER — Other Ambulatory Visit (HOSPITAL_COMMUNITY): Payer: Self-pay | Admitting: Internal Medicine

## 2017-03-17 DIAGNOSIS — J0101 Acute recurrent maxillary sinusitis: Secondary | ICD-10-CM | POA: Diagnosis not present

## 2017-03-17 DIAGNOSIS — K029 Dental caries, unspecified: Secondary | ICD-10-CM | POA: Diagnosis not present

## 2017-03-17 DIAGNOSIS — Z23 Encounter for immunization: Secondary | ICD-10-CM | POA: Diagnosis not present

## 2017-03-18 DIAGNOSIS — K006 Disturbances in tooth eruption: Secondary | ICD-10-CM | POA: Diagnosis not present

## 2017-03-18 NOTE — Progress Notes (Signed)
No ICM remote transmission received for 03/11/2017 and next ICM transmission scheduled for 04/08/2017.    

## 2017-04-08 ENCOUNTER — Ambulatory Visit (INDEPENDENT_AMBULATORY_CARE_PROVIDER_SITE_OTHER): Payer: Medicare HMO

## 2017-04-08 ENCOUNTER — Ambulatory Visit (INDEPENDENT_AMBULATORY_CARE_PROVIDER_SITE_OTHER): Payer: Medicare HMO | Admitting: *Deleted

## 2017-04-08 DIAGNOSIS — Z95810 Presence of automatic (implantable) cardiac defibrillator: Secondary | ICD-10-CM

## 2017-04-08 DIAGNOSIS — I5022 Chronic systolic (congestive) heart failure: Secondary | ICD-10-CM

## 2017-04-08 DIAGNOSIS — I428 Other cardiomyopathies: Secondary | ICD-10-CM

## 2017-04-08 NOTE — Progress Notes (Signed)
EPIC Encounter for ICM Monitoring  Patient Name: Rhonda [name] is a 66 y.o. female Date: 04/08/2017 Primary Care Physican: Maurice Small, MD Primary Cardiologist: [place] Electrophysiologist: Lovena Le Dry Weight: does not weigh Bi-V Pacing: 100%       Heart Failure questions reviewed, pt asymptomatic.  Patient had 7 teeth pulled and eating a lot of soup that is high in sodium.    Thoracic impedance abnormal suggesting fluid accumulation since 03/16/2017.  Prescribed dosage: Torsemide 20 mg 2 tablets (40 mg total) every AM and 1 tablet (20 mg total)every PM. Potassium 20 meq 1 tablet daily.  Metolazone 2.66m 1 tablet by mouth if needed for fluid or edema.  Labs: 10/25/2017Creatinine 1.35, BUN 32, Potassium 4.1, Sodium 139, EGFR 40-48 03/02/2016 Creatinine 1.72, BUN 44, Potassium 4.2, Sodium 135, EGFR 30-35  02/19/2016 Creatinine 1.77, BUN 44, Potassium 4.1, Sodium 136, EGFR 29-34  07/10/2015 Creatinine 1.49, BUN 23, Potassium 4.3, Sodium 140, EGFR 36-42  Recommendations: Advised to take PRN Metolazone x 1 dose tomorrow that is prescribed along with additional Potassium - 1 tablet.   Encouraged to call for fluid symptoms.  Follow-up plan: ICM clinic phone appointment on 04/26/2017 to recheck fluid levels.    Copy of ICM check sent to Dr. BHaroldine Lawsand Dr. TLovena Le   3 month ICM trend: 04/08/2017    1 Year ICM trend:       [name] RN 04/08/2017 10:44 AM

## 2017-04-08 NOTE — Progress Notes (Signed)
Remote ICD transmission.   

## 2017-04-09 LAB — CUP PACEART REMOTE DEVICE CHECK
Battery Remaining Longevity: 29 mo
Battery Voltage: 2.95 V
Brady Statistic AP VP Percent: 0.21 %
Brady Statistic AP VS Percent: 0.01 %
Brady Statistic AS VP Percent: 99.76 %
Brady Statistic AS VS Percent: 0.03 %
Brady Statistic RA Percent Paced: 0.22 %
Brady Statistic RV Percent Paced: 99.95 %
Date Time Interrogation Session: 20181115094224
HighPow Impedance: 61 Ohm
Implantable Lead Implant Date: 20141210
Implantable Lead Implant Date: 20141210
Implantable Lead Implant Date: 20141210
Implantable Lead Location: 753858
Implantable Lead Location: 753859
Implantable Lead Location: 753860
Implantable Lead Model: 4396
Implantable Lead Model: 5076
Implantable Lead Model: 6935
Implantable Pulse Generator Implant Date: 20141210
Lead Channel Impedance Value: 323 Ohm
Lead Channel Impedance Value: 323 Ohm
Lead Channel Impedance Value: 456 Ohm
Lead Channel Impedance Value: 532 Ohm
Lead Channel Impedance Value: 532 Ohm
Lead Channel Impedance Value: 627 Ohm
Lead Channel Pacing Threshold Amplitude: 0.75 V
Lead Channel Pacing Threshold Amplitude: 1 V
Lead Channel Pacing Threshold Amplitude: 1.375 V
Lead Channel Pacing Threshold Pulse Width: 0.4 ms
Lead Channel Pacing Threshold Pulse Width: 0.4 ms
Lead Channel Pacing Threshold Pulse Width: 0.4 ms
Lead Channel Sensing Intrinsic Amplitude: 20.625 mV
Lead Channel Sensing Intrinsic Amplitude: 20.625 mV
Lead Channel Sensing Intrinsic Amplitude: 3.75 mV
Lead Channel Sensing Intrinsic Amplitude: 3.75 mV
Lead Channel Setting Pacing Amplitude: 2.25 V
Lead Channel Setting Pacing Amplitude: 2.5 V
Lead Channel Setting Pacing Amplitude: 2.75 V
Lead Channel Setting Pacing Pulse Width: 0.4 ms
Lead Channel Setting Pacing Pulse Width: 0.4 ms
Lead Channel Setting Sensing Sensitivity: 0.3 mV

## 2017-04-12 ENCOUNTER — Other Ambulatory Visit (HOSPITAL_COMMUNITY): Payer: Self-pay | Admitting: *Deleted

## 2017-04-12 MED ORDER — POTASSIUM CHLORIDE CRYS ER 20 MEQ PO TBCR: 20.0000 meq | EXTENDED_RELEASE_TABLET | Freq: Every day | ORAL | 6 refills | Status: DC

## 2017-04-14 ENCOUNTER — Encounter: Payer: Self-pay | Admitting: Cardiology

## 2017-04-19 ENCOUNTER — Other Ambulatory Visit (HOSPITAL_COMMUNITY): Payer: Self-pay | Admitting: *Deleted

## 2017-04-19 MED ORDER — SPIRONOLACTONE 25 MG PO TABS: 25.0000 mg | ORAL_TABLET | Freq: Every day | ORAL | 3 refills | Status: DC

## 2017-04-26 ENCOUNTER — Ambulatory Visit (INDEPENDENT_AMBULATORY_CARE_PROVIDER_SITE_OTHER): Payer: Self-pay

## 2017-04-26 DIAGNOSIS — I5022 Chronic systolic (congestive) heart failure: Secondary | ICD-10-CM

## 2017-04-26 DIAGNOSIS — Z95810 Presence of automatic (implantable) cardiac defibrillator: Secondary | ICD-10-CM

## 2017-04-26 NOTE — Progress Notes (Signed)
EPIC Encounter for ICM Monitoring  Patient Name: Rhonda [name] is a 66 y.o. female Date: 04/26/2017 Primary Care Physican: Maurice Small, MD Primary Cardiologist: [place] Electrophysiologist: Lovena Le Dry Weight: does not weigh Bi-V Pacing: 100%       Heart Failure questions reviewed, pt symptomatic with feet swelling and redness during decreased impedance but now she only has a little redness left.  Patient says she does not get up until around 12 noon and she takes her AM Torsemide dosage after that and then the PM dosage around 8 PM at night. Difficult to get patient to answer questions directly when asked about medication compliance.   If she has any errands to do she does not take the dosages until sometime that night.  On call today, she has not taken any medications yet.     Thoracic impedance abnormal suggesting fluid accumulation starting 03/16/2017 with one day at baseline on 04/21/17   Prescribed dosage: Torsemide 20 mg 2 tablets (40 mg total) every AM and 1 tablet (20 mg total)every PM. Potassium 20 meq 1 tablet daily. Metolazone 2.80m 1 tablet by mouth if needed for fluid or edema.  Labs: 10/25/2017Creatinine 1.35, BUN 32, Potassium 4.1, Sodium 139, EGFR 40-48 03/02/2016 Creatinine 1.72, BUN 44, Potassium 4.2, Sodium 135, EGFR 30-35  02/19/2016 Creatinine 1.77, BUN 44, Potassium 4.1, Sodium 136, EGFR 29-34  07/10/2015 Creatinine 1.49, BUN 23, Potassium 4.3, Sodium 140, EGFR 36-42  Recommendations:  Explained the importance of taking meds as prescribed and they should be taken at the same time every day.  Advised of increased hospital risk by not taking her medications correctly or at all.  Patient does not follow low salt diet and not compliant with taking Torsemide as prescribed.  Encouraged to call for fluid symptoms.  Follow-up plan: ICM clinic phone appointment on 05/13/2017 to recheck fluid levels.    Copy of ICM check sent to Dr. BHaroldine Lawsand Dr. TLovena Lefor review  and recommendations if needed.   3 month ICM trend: 04/26/2017    1 Year ICM trend:       [name] RN 04/26/2017 11:36 AM

## 2017-04-28 ENCOUNTER — Ambulatory Visit: Payer: Medicare HMO | Admitting: Neurology

## 2017-05-07 ENCOUNTER — Other Ambulatory Visit (HOSPITAL_COMMUNITY): Payer: Self-pay | Admitting: *Deleted

## 2017-05-07 MED ORDER — METOLAZONE 2.5 MG PO TABS: ORAL_TABLET | ORAL | 2 refills | Status: DC

## 2017-05-10 ENCOUNTER — Ambulatory Visit: Payer: Medicare HMO | Admitting: *Deleted

## 2017-05-10 ENCOUNTER — Telehealth: Payer: Self-pay

## 2017-05-10 NOTE — Telephone Encounter (Signed)
Pt agreeable to apt today at 2:00pm to have device checked d/t RV defib impedance.

## 2017-05-10 NOTE — Telephone Encounter (Signed)
LVM on pt's home phone and cell phone to call device clinic regarding slight bump in RV defib impedance. (Pt overdue for f/u with [facility])

## 2017-05-13 ENCOUNTER — Ambulatory Visit (INDEPENDENT_AMBULATORY_CARE_PROVIDER_SITE_OTHER): Payer: Medicare HMO

## 2017-05-13 DIAGNOSIS — Z95810 Presence of automatic (implantable) cardiac defibrillator: Secondary | ICD-10-CM

## 2017-05-13 DIAGNOSIS — I5022 Chronic systolic (congestive) heart failure: Secondary | ICD-10-CM | POA: Diagnosis not present

## 2017-05-14 NOTE — Progress Notes (Signed)
EPIC Encounter for ICM Monitoring  Patient Name: Rhonda [name] is a 66 y.o. female Date: 05/14/2017 Primary Care Physican: Webb, Carol, MD Primary Cardiologist: [place] Electrophysiologist: Taylor Dry Weight: does not weigh Bi-V Pacing: 99.9%         Transmission received.   Thoracic impedance returned to normal since 12/3 remote transmission.  Prescribed dosage: Torsemide 20 mg 2 tablets (40 mg total) every AM and 1 tablet (20 mg total)every PM. Potassium 20 meq 1 tablet daily. Metolazone 2.5mg 1 tablet by mouth if needed for fluid or edema.  Labs: 10/25/2017Creatinine 1.35, BUN 32, Potassium 4.1, Sodium 139, EGFR 40-48 03/02/2016 Creatinine 1.72, BUN 44, Potassium 4.2, Sodium 135, EGFR 30-35  02/19/2016 Creatinine 1.77, BUN 44, Potassium 4.1, Sodium 136, EGFR 29-34  07/10/2015 Creatinine 1.49, BUN 23, Potassium 4.3, Sodium 140, EGFR 36-42  Recommendations: None.  Follow-up plan: ICM clinic phone appointment on 07/08/2017.  Office appointment scheduled 06/04/2017 with Dr. Taylor.  Copy of ICM check sent to Dr. Taylor.   3 month ICM trend: 07/14/2016    1 Year ICM trend:        S , RN 05/14/2017 12:46 PM     

## 2017-05-19 DIAGNOSIS — R011 Cardiac murmur, unspecified: Secondary | ICD-10-CM | POA: Insufficient documentation

## 2017-05-19 DIAGNOSIS — G43909 Migraine, unspecified, not intractable, without status migrainosus: Secondary | ICD-10-CM | POA: Insufficient documentation

## 2017-05-19 DIAGNOSIS — F329 Major depressive disorder, single episode, unspecified: Secondary | ICD-10-CM | POA: Insufficient documentation

## 2017-05-19 DIAGNOSIS — E785 Hyperlipidemia, unspecified: Secondary | ICD-10-CM | POA: Insufficient documentation

## 2017-05-19 DIAGNOSIS — D649 Anemia, unspecified: Secondary | ICD-10-CM | POA: Insufficient documentation

## 2017-05-19 DIAGNOSIS — T8859XA Other complications of anesthesia, initial encounter: Secondary | ICD-10-CM | POA: Insufficient documentation

## 2017-05-19 DIAGNOSIS — M549 Dorsalgia, unspecified: Secondary | ICD-10-CM | POA: Insufficient documentation

## 2017-05-19 DIAGNOSIS — Z9981 Dependence on supplemental oxygen: Secondary | ICD-10-CM | POA: Insufficient documentation

## 2017-05-19 DIAGNOSIS — I10 Essential (primary) hypertension: Secondary | ICD-10-CM | POA: Insufficient documentation

## 2017-05-19 DIAGNOSIS — I447 Left bundle-branch block, unspecified: Secondary | ICD-10-CM | POA: Insufficient documentation

## 2017-05-19 DIAGNOSIS — F419 Anxiety disorder, unspecified: Secondary | ICD-10-CM

## 2017-05-19 DIAGNOSIS — R0602 Shortness of breath: Secondary | ICD-10-CM | POA: Insufficient documentation

## 2017-05-19 DIAGNOSIS — T4145XA Adverse effect of unspecified anesthetic, initial encounter: Secondary | ICD-10-CM | POA: Insufficient documentation

## 2017-05-19 DIAGNOSIS — K219 Gastro-esophageal reflux disease without esophagitis: Secondary | ICD-10-CM | POA: Insufficient documentation

## 2017-05-19 DIAGNOSIS — M1990 Unspecified osteoarthritis, unspecified site: Secondary | ICD-10-CM | POA: Insufficient documentation

## 2017-05-19 DIAGNOSIS — G629 Polyneuropathy, unspecified: Secondary | ICD-10-CM | POA: Insufficient documentation

## 2017-05-19 DIAGNOSIS — E039 Hypothyroidism, unspecified: Secondary | ICD-10-CM | POA: Insufficient documentation

## 2017-05-19 DIAGNOSIS — F411 Generalized anxiety disorder: Secondary | ICD-10-CM | POA: Insufficient documentation

## 2017-05-21 ENCOUNTER — Telehealth (HOSPITAL_COMMUNITY): Payer: Self-pay

## 2017-05-21 NOTE — Telephone Encounter (Signed)
Patient was worried that Celexa may cause her defibrillator to "go off". This is not likely with Celexa although there is a small risk of QTc prolongation. Last EKG QTc looked to be wnl. Have advised patient that as long as Dr. Tanna Furry office is aware of her taking the Celexa, she should ok to take this medication. I also advised her to take her new PRN ibuprofen Rx (for muscle aches) sparingly as it could have a detrimental effect on her kidneys and blood pressure.   [name]. [name], PharmD, BCPS, CPP Clinical Pharmacist Pager: [phone] Phone: [phone] 05/21/2017 2:58 PM

## 2017-05-21 NOTE — Telephone Encounter (Signed)
Patient left VM on CHF clinic triage line asking to talk with pharmacist regarding a new medications he was started on and would like to know if it is safe to take with other medications and potential side effects. Will forward to CHF clinical pharmacist to assist.  Renee Pain, RN

## 2017-05-22 LAB — CUP PACEART INCLINIC DEVICE CHECK
Date Time Interrogation Session: 20181229110014
Implantable Lead Implant Date: 20141210
Implantable Lead Implant Date: 20141210
Implantable Lead Implant Date: 20141210
Implantable Lead Location: 753858
Implantable Lead Location: 753859
Implantable Lead Location: 753860
Implantable Lead Model: 4396
Implantable Lead Model: 5076
Implantable Lead Model: 6935
Implantable Pulse Generator Implant Date: 20141210

## 2017-06-02 DIAGNOSIS — F0632 Mood disorder due to known physiological condition with major depressive-like episode: Secondary | ICD-10-CM | POA: Diagnosis not present

## 2017-06-02 DIAGNOSIS — F3289 Other specified depressive episodes: Secondary | ICD-10-CM | POA: Diagnosis not present

## 2017-06-04 ENCOUNTER — Other Ambulatory Visit (HOSPITAL_COMMUNITY): Payer: Self-pay | Admitting: *Deleted

## 2017-06-04 ENCOUNTER — Encounter: Payer: Self-pay | Admitting: Internal Medicine

## 2017-06-04 ENCOUNTER — Ambulatory Visit (INDEPENDENT_AMBULATORY_CARE_PROVIDER_SITE_OTHER): Payer: Medicare HMO | Admitting: Internal Medicine

## 2017-06-04 VITALS — BP 118/70 | HR 72 | Ht 61.0 in | Wt 228.0 lb

## 2017-06-04 DIAGNOSIS — Z95810 Presence of automatic (implantable) cardiac defibrillator: Secondary | ICD-10-CM | POA: Diagnosis not present

## 2017-06-04 DIAGNOSIS — I447 Left bundle-branch block, unspecified: Secondary | ICD-10-CM | POA: Diagnosis not present

## 2017-06-04 DIAGNOSIS — I509 Heart failure, unspecified: Secondary | ICD-10-CM

## 2017-06-04 DIAGNOSIS — I5022 Chronic systolic (congestive) heart failure: Secondary | ICD-10-CM | POA: Diagnosis not present

## 2017-06-04 MED ORDER — CARVEDILOL 6.25 MG PO TABS: ORAL_TABLET | ORAL | 3 refills | Status: DC

## 2017-06-04 NOTE — Progress Notes (Signed)
HPI [name] returns today for ongoing followup of her chronic systolic heart failure, s/p BiV ICD. She has morbid obesity as well. She has c/o peripheral edema and redness on her lower legs. No ICD shocks and no chest pain. Her CHF is class 2.  Allergies  Allergen Reactions  . Ceftin Anaphylaxis    Face and throat swell   . Shellfish Allergy Other (See Comments)    Gout exacerbation  . Ativan [Lorazepam] Itching  . Allopurinol Nausea Only and Other (See Comments)    weakness  . Lorazepam Itching  . Sulfa Antibiotics Itching  . Ultram [Tramadol Hcl] Itching     Current Outpatient Medications  Medication Sig Dispense Refill  . ARIPiprazole (ABILIFY) 2 MG tablet Take 2 mg by mouth every morning.     Marland Kitchen buPROPion (WELLBUTRIN XL) 150 MG 24 hr tablet Take 450 mg by mouth every morning.     . carvedilol (COREG) 6.25 MG tablet take 1 tablet by mouth every morning and 2 tablets by mouth every evening 90 tablet 3  . citalopram (CELEXA) 10 MG tablet Take 10 mg by mouth daily.    . clonazePAM (KLONOPIN) 0.5 MG tablet Take 0.5 mg by mouth 2 (two) times daily as needed for anxiety. 1 in morning, 1 at bedtime    . febuxostat (ULORIC) 40 MG tablet Take 40 mg by mouth at bedtime.     . fexofenadine (ALLEGRA) 180 MG tablet take 1 tablet by mouth once daily if needed  0  . gabapentin (NEURONTIN) 300 MG capsule Take 300 mg by mouth 3 (three) times daily.     Marland Kitchen gabapentin (NEURONTIN) 800 MG tablet   1  . ibuprofen (ADVIL,MOTRIN) 800 MG tablet Take 800 mg by mouth 3 (three) times daily as needed for mild pain.    Marland Kitchen levothyroxine (SYNTHROID, LEVOTHROID) 125 MCG tablet Take 125 mcg by mouth every morning. On an empty stomach    . metolazone (ZAROXOLYN) 2.5 MG tablet take 1 tablet by mouth if needed for FLUID OR EDEMA 30 tablet 2  . montelukast (SINGULAIR) 10 MG tablet   1  . nabumetone (RELAFEN) 500 MG tablet Take 500 mg by mouth 2 (two) times daily as needed.    . potassium chloride SA  (K-DUR,KLOR-CON) 20 MEQ tablet Take 1 tablet (20 mEq total) daily by mouth. 90 tablet 6  . simvastatin (ZOCOR) 10 MG tablet Take 10 mg by mouth every evening.     Marland Kitchen spironolactone (ALDACTONE) 25 MG tablet Take 1 tablet (25 mg total) by mouth daily. 30 tablet 3  . torsemide (DEMADEX) 20 MG tablet take 2 tablets by mouth every morning and 1 tablet every evening 90 tablet 3  . traMADol (ULTRAM) 50 MG tablet   0  . zolpidem (AMBIEN) 10 MG tablet Take 10 mg by mouth at bedtime as needed. For sleep     Current Facility-Administered Medications  Medication Dose Route Frequency Provider Last Rate Last Dose  . triamcinolone acetonide (KENALOG-40) injection 20 mg  20 mg Other Once Landis Martins, DPM         Past Medical History:  Diagnosis Date  . Anemia   . Anxiety   . Arthritis    RIGHT KNEE ARTHRITIS AND PAIN-PT TOLD BONE ON BONE  . Asthma   . Back pain    DISK PROBLEM  . Breast cancer, stage 1 ([place]) 03/26/2011   left-FINISHED CHEMO  AND RADIATION  . Cardiomyopathy    PT'S  CARDIOLOGIST IS DR. Fransico Him  . Chronic systolic heart failure (HCC)    a) NICM b) ECHO (03/2013) EF 20-25% c) ECHO (09/2013) EF 45-50%, grade I DD  . Complication of anesthesia    PT STATES HER B/P LOW AFTER ONE OF HER SURGERIES--SHE ATTRIBUTES TO LYING FLAT  . Depression   . Diabetes mellitus    "diet controlled" (05/04/2103)  . Exertional shortness of breath   . GERD (gastroesophageal reflux disease)   . Gout   . Heart murmur   . Hyperlipidemia   . Hypertension   . Hypothyroidism   . Left bundle branch block    s/p CRT-D (04/2013)  . Migraines   . On home oxygen therapy    "2L suppose to be q night" (05/03/2013)  . Peripheral neuropathy    feet  . SVD (spontaneous vaginal delivery)    x 2  . Unspecified vitamin D deficiency 03/26/2011   does not take meds    ROS:   All systems reviewed and negative except as noted in the HPI.   Past Surgical History:  Procedure Laterality Date  .  BI-VENTRICULAR IMPLANTABLE CARDIOVERTER DEFIBRILLATOR N/A 05/03/2013   Procedure: BI-VENTRICULAR IMPLANTABLE CARDIOVERTER DEFIBRILLATOR  (CRT-D);  Surgeon: Evans Lance, MD;  Location: [facility] CATH LAB;  Service: Cardiovascular;  Laterality: N/A;  . BI-VENTRICULAR IMPLANTABLE CARDIOVERTER DEFIBRILLATOR  (CRT-D)  05/03/2013   MDT CRTD implanted by Dr Lovena Le for non ischemic cardiomyopathy  . BREAST LUMPECTOMY Left 07/28/2010  . CARDIAC CATHETERIZATION  2010   NORMAL CORONARY ARTERIES  . 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--11 SURGERIES     PT HAS REMOVABLE SPEECH BULB-TAKES IT OUT BEFORE HER SURGERY  . COLONOSCOPY    . HYSTEROSCOPY W/D&C  01/20/2012   Procedure: DILATATION AND CURETTAGE /HYSTEROSCOPY;  Surgeon: Margarette Asal, MD;  Location: [facility];  Service: Gynecology;  Laterality: N/A;  with trueclear  . PORT-A-CATH REMOVAL  09/23/2011   Procedure: REMOVAL PORT-A-CATH;  Surgeon: Rolm Bookbinder, MD;  Location: [facility];  Service: General;  Laterality: N/A;  Port Removal  . PORTACATH PLACEMENT  2012  . TONSILLECTOMY  1960's  . TOTAL KNEE ARTHROPLASTY  05/10/2012   Procedure: TOTAL KNEE ARTHROPLASTY;  Surgeon: Mauri Pole, MD;  Location: [facility];  Service: Orthopedics;  Laterality: Right;  . WISDOM TOOTH EXTRACTION       Family History  Problem Relation Age of Onset  . Alzheimer's disease Mother   . CVA Mother   . Hypertension Mother   . Cancer - Colon Father        late 96  . Birth defects Paternal Uncle      Social History   Socioeconomic History  . Marital status: Married    Spouse name: Not on file  . Number of children: 2  . Years of education: masters  . Highest education level: Not on file  Social Needs  . Financial resource strain: Not on file  . Food insecurity - worry: Not on file  . Food insecurity - inability: Not on file  . Transportation needs - medical: Not on file  . Transportation needs - non-medical: Not on file  Occupational History  . Not on file  Tobacco  Use  . Smoking status: Former Smoker    Packs/day: 0.10    Years: 26.00    Pack years: 2.60    Types: Cigarettes    Last attempt to quit: 05/06/2007    Years since quitting: 10.0  . Smokeless tobacco: Never  Used  Substance and Sexual Activity  . Alcohol use: No  . Drug use: No  . Sexual activity: Yes  Other Topics Concern  . Not on file  Social History Narrative   Tobacco Use Cigarettes: Former Smoker, Quit in 2008   No Alcohol   No recreational drug use   Diet: Regular/Low Carb   Exercise: None   Occupation: disabled   Education: Research officer, political party, masters   Children: 2   Firearms: No   Therapist, art Use: Always   Former Metallurgist.         BP 118/70   Pulse 72   Ht 5\' 1"  (1.549 m)   Wt 228 lb (103.4 kg)   BMI 43.08 kg/m   Physical Exam:  Well appearing NAD HEENT: Unremarkable Neck:  No JVD, no thyromegally Lymphatics:  No adenopathy Back:  No CVA tenderness Lungs:  Clear HEART:  Regular rate rhythm, no murmurs, no rubs, no clicks Abd:  soft, positive bowel sounds, no organomegally, no rebound, no guarding Ext:  2 plus pulses, no edema, no cyanosis, no clubbing Skin:  No rashes no nodules Neuro:  CN II through XII intact, motor grossly intact   DEVICE  Normal device function.  See PaceArt for details.   Assess/Plan: 1. Chronic systolic heart failure - her symptoms are class 2. She is well compensated. She is encouraged to reduce her salt intake. 2. ICD - Her medtronic BiV ICD is working normally. Will recheck in several months. 3. Obesity - she is encouraged to lose weight. 4. Venous insufficieincy - I have given her a script for support stockings. She is encouraged to keep her legs elevated.  Mikle Bosworth.D.

## 2017-06-04 NOTE — Patient Instructions (Signed)
Medication Instructions:  Your physician recommends that you continue on your current medications as directed. Please refer to the Current Medication list given to you today.  Labwork: None ordered.  Testing/Procedures: None ordered.  Follow-Up: Your physician wants you to follow-up in: one year with Dr.Taylor.   You will receive a reminder letter in the mail two months in advance. If you don't receive a letter, please call our office to schedule the follow-up appointment.  Remote monitoring is used to monitor your ICD from home. This monitoring reduces the number of office visits required to check your device to one time per year. It allows [place] to keep an eye on the functioning of your device to ensure it is working properly. You are scheduled for a device check from home on 07/08/2017. You may send your transmission at any time that day. If you have a wireless device, the transmission will be sent automatically. After your physician reviews your transmission, you will receive a postcard with your next transmission date.    Any Other Special Instructions Will Be Listed Below (If Applicable).     If you need a refill on your cardiac medications before your next appointment, please call your pharmacy.

## 2017-06-08 LAB — CUP PACEART INCLINIC DEVICE CHECK
Battery Remaining Longevity: 25 mo
Battery Voltage: 2.94 V
Brady Statistic AP VP Percent: 0.05 %
Brady Statistic AP VS Percent: 0.01 %
Brady Statistic AS VP Percent: 99.92 %
Brady Statistic AS VS Percent: 0.03 %
Brady Statistic RA Percent Paced: 0.05 %
Brady Statistic RV Percent Paced: 99.94 %
Date Time Interrogation Session: 20190111192327
HighPow Impedance: 79 Ohm
Implantable Lead Implant Date: 20141210
Implantable Lead Implant Date: 20141210
Implantable Lead Implant Date: 20141210
Implantable Lead Location: 753858
Implantable Lead Location: 753859
Implantable Lead Location: 753860
Implantable Lead Model: 4396
Implantable Lead Model: 5076
Implantable Lead Model: 6935
Implantable Pulse Generator Implant Date: 20141210
Lead Channel Impedance Value: 342 Ohm
Lead Channel Impedance Value: 399 Ohm
Lead Channel Impedance Value: 456 Ohm
Lead Channel Impedance Value: 532 Ohm
Lead Channel Impedance Value: 646 Ohm
Lead Channel Impedance Value: 646 Ohm
Lead Channel Pacing Threshold Amplitude: 0.875 V
Lead Channel Pacing Threshold Amplitude: 1.25 V
Lead Channel Pacing Threshold Amplitude: 1.375 V
Lead Channel Pacing Threshold Pulse Width: 0.4 ms
Lead Channel Pacing Threshold Pulse Width: 0.4 ms
Lead Channel Pacing Threshold Pulse Width: 0.4 ms
Lead Channel Sensing Intrinsic Amplitude: 2.875 mV
Lead Channel Sensing Intrinsic Amplitude: 29.625 mV
Lead Channel Sensing Intrinsic Amplitude: 3 mV
Lead Channel Sensing Intrinsic Amplitude: 30.625 mV
Lead Channel Setting Pacing Amplitude: 2.25 V
Lead Channel Setting Pacing Amplitude: 2.5 V
Lead Channel Setting Pacing Amplitude: 2.75 V
Lead Channel Setting Pacing Pulse Width: 0.4 ms
Lead Channel Setting Pacing Pulse Width: 0.4 ms
Lead Channel Setting Sensing Sensitivity: 0.3 mV

## 2017-06-21 ENCOUNTER — Emergency Department (HOSPITAL_COMMUNITY): Payer: Medicare HMO

## 2017-06-21 ENCOUNTER — Encounter (HOSPITAL_COMMUNITY): Payer: Self-pay

## 2017-06-21 DIAGNOSIS — Z87891 Personal history of nicotine dependence: Secondary | ICD-10-CM | POA: Insufficient documentation

## 2017-06-21 DIAGNOSIS — J45909 Unspecified asthma, uncomplicated: Secondary | ICD-10-CM | POA: Insufficient documentation

## 2017-06-21 DIAGNOSIS — Z79899 Other long term (current) drug therapy: Secondary | ICD-10-CM | POA: Insufficient documentation

## 2017-06-21 DIAGNOSIS — I5022 Chronic systolic (congestive) heart failure: Secondary | ICD-10-CM | POA: Insufficient documentation

## 2017-06-21 DIAGNOSIS — E119 Type 2 diabetes mellitus without complications: Secondary | ICD-10-CM | POA: Insufficient documentation

## 2017-06-21 DIAGNOSIS — E039 Hypothyroidism, unspecified: Secondary | ICD-10-CM | POA: Insufficient documentation

## 2017-06-21 DIAGNOSIS — E876 Hypokalemia: Secondary | ICD-10-CM | POA: Diagnosis not present

## 2017-06-21 DIAGNOSIS — Z95810 Presence of automatic (implantable) cardiac defibrillator: Secondary | ICD-10-CM | POA: Diagnosis not present

## 2017-06-21 DIAGNOSIS — S299XXA Unspecified injury of thorax, initial encounter: Secondary | ICD-10-CM | POA: Diagnosis not present

## 2017-06-21 DIAGNOSIS — T82198A Other mechanical complication of other cardiac electronic device, initial encounter: Secondary | ICD-10-CM | POA: Diagnosis not present

## 2017-06-21 DIAGNOSIS — R51 Headache: Secondary | ICD-10-CM | POA: Diagnosis present

## 2017-06-21 DIAGNOSIS — Z96651 Presence of right artificial knee joint: Secondary | ICD-10-CM | POA: Diagnosis not present

## 2017-06-21 DIAGNOSIS — I110 Hypertensive heart disease with heart failure: Secondary | ICD-10-CM | POA: Insufficient documentation

## 2017-06-21 LAB — BASIC METABOLIC PANEL
Anion gap: 17 — ABNORMAL HIGH (ref 5–15)
BUN: 38 mg/dL — ABNORMAL HIGH (ref 6–20)
CO2: 32 mmol/L (ref 22–32)
Calcium: 9 mg/dL (ref 8.9–10.3)
Chloride: 87 mmol/L — ABNORMAL LOW (ref 101–111)
Creatinine, Ser: 2.06 mg/dL — ABNORMAL HIGH (ref 0.44–1.00)
GFR calc Af Amer: 28 mL/min — ABNORMAL LOW (ref >60)
GFR calc non Af Amer: 24 mL/min — ABNORMAL LOW (ref >60)
Glucose, Bld: 99 mg/dL (ref 65–99)
Potassium: 2.5 mmol/L — CL (ref 3.5–5.1)
Sodium: 136 mmol/L (ref 135–145)

## 2017-06-21 LAB — CBC
HCT: 37 % (ref 36.0–46.0)
Hemoglobin: 12.6 g/dL (ref 12.0–15.0)
MCH: 33.6 pg (ref 26.0–34.0)
MCHC: 34.1 g/dL (ref 30.0–36.0)
MCV: 98.7 fL (ref 78.0–100.0)
Platelets: 272 10*3/uL (ref 150–400)
RBC: 3.75 MIL/uL — ABNORMAL LOW (ref 3.87–5.11)
RDW: 13.4 % (ref 11.5–15.5)
WBC: 7 10*3/uL (ref 4.0–10.5)

## 2017-06-21 LAB — I-STAT TROPONIN, ED: Troponin i, poc: 0.01 ng/mL (ref 0.00–0.08)

## 2017-06-21 NOTE — ED Triage Notes (Signed)
Pt states that her pacemaker fired this evening about an hour ago. Medtronic pacemaker. Pt reports fall today after tripping, hit head, no LOC, not on blood thinners, no injuries.

## 2017-06-22 ENCOUNTER — Telehealth: Payer: Self-pay | Admitting: Neurology

## 2017-06-22 ENCOUNTER — Telehealth: Payer: Self-pay | Admitting: Internal Medicine

## 2017-06-22 ENCOUNTER — Emergency Department (HOSPITAL_COMMUNITY)
Admission: EM | Admit: 2017-06-22 | Discharge: 2017-06-22 | Disposition: A | Discharge status: Home / Self Care | Payer: Medicare HMO | Attending: Emergency Medicine | Admitting: Emergency Medicine

## 2017-06-22 DIAGNOSIS — E876 Hypokalemia: Secondary | ICD-10-CM

## 2017-06-22 DIAGNOSIS — Z95810 Presence of automatic (implantable) cardiac defibrillator: Secondary | ICD-10-CM

## 2017-06-22 MED ORDER — POTASSIUM CHLORIDE CRYS ER 20 MEQ PO TBCR
80.0000 meq | EXTENDED_RELEASE_TABLET | Freq: Once | ORAL | Status: AC
  Administered 2017-06-22: 80 meq via ORAL
  Filled 2017-06-22: qty 4

## 2017-06-22 NOTE — Telephone Encounter (Signed)
Mrs. Kindig is calling because her defibrillator went off twice and went to the ER and was told to call and speak with the office . Please call   Thanks

## 2017-06-22 NOTE — ED Notes (Signed)
Pacemaker interrogated. 

## 2017-06-22 NOTE — Telephone Encounter (Signed)
LVM on pts home phone and cell phone, direct number to device clinic given.

## 2017-06-22 NOTE — Telephone Encounter (Signed)
Patient called to let you know that she has had mouth surgery and that's why she has not made a follow up appt. She said that she is concerned because she has been falling a lot. She also said that she had to go to the ED last night because she fell hard in her kitchen and hit her head and she said her Defibrillator and pace maker went off 2 times. She was also told that her Potassium was low. Please Call. Thanks

## 2017-06-22 NOTE — ED Provider Notes (Signed)
[facility] EMERGENCY DEPARTMENT Provider Note   CSN: [id number] Arrival date & time: 06/21/17  2149     History   Chief Complaint Chief Complaint  Patient presents with  . Pacemaker fired    HPI Rhonda [name] is a 67 y.o. female.   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 She has a Medtronic CRT-D device.  She presents to the emergency department for evaluation of alerts coming from her Medtronic pacemaker.  She states that she heard a "horn sound" prior to arrival as well as while in the waiting room.  She states that this happened on 06/04/2017 and she was seen in the office by Dr. Lovena Le.  She indicates that the alert was due to the need for changes to her threshold settings.  She has had no defibrillation or shock today.  She denies any chest pain or shortness of breath.  She has an issue with balance and gait stability at baseline for which she is followed by neurology.  This caused her to trip over her feet earlier this morning.  She struck her head, but had no loss of consciousness.  She is not on blood thinners.  She does complain of a mild headache.  She wanted to ensure that the alerts from her pacemaker were not due to anything concerning.  She has been compliant with her daily medications.     Past Medical History:  Diagnosis Date  . Anemia   . Anxiety   . Arthritis    RIGHT KNEE ARTHRITIS AND PAIN-PT TOLD BONE ON BONE  . Asthma   . Back pain    DISK PROBLEM  . Breast cancer, stage 1 ([place]) 03/26/2011   left-FINISHED CHEMO  AND RADIATION  . Cardiomyopathy    [name]  . Chronic systolic heart failure (HCC)    a) NICM b) ECHO (03/2013) EF 20-25% c) ECHO (09/2013) EF 45-50%, grade I DD  .  Complication of anesthesia    PT STATES HER B/P LOW AFTER ONE OF HER SURGERIES--SHE ATTRIBUTES TO LYING FLAT  . Depression   . Diabetes mellitus    "diet controlled" (05/04/2103)  . Exertional shortness of breath   . GERD (gastroesophageal reflux disease)   . Gout   . Heart murmur   . Hyperlipidemia   . Hypertension   . Hypothyroidism   . Left bundle branch block    s/p CRT-D (04/2013)  . Migraines   . On home oxygen therapy    "2L suppose to be q night" (05/03/2013)  . Peripheral neuropathy    feet  . SVD (spontaneous vaginal delivery)    x 2  . Unspecified vitamin D deficiency 03/26/2011   does not take meds    Patient Active Problem List   Diagnosis Date Noted  . SVD (spontaneous vaginal delivery)   . Peripheral neuropathy   . On home oxygen therapy   . Migraines   . Left bundle branch block   . Hypothyroidism   . Hypertension   . Hyperlipidemia   . Heart murmur   . GERD (gastroesophageal reflux disease)   . Exertional shortness of breath   . Depression   . Complication of anesthesia   . Back pain   .  Arthritis   . Anxiety   . Anemia   . HTN (hypertension) 09/14/2013  . Chronic respiratory failure ([place]) 09/14/2013  . 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 Morbid obesity ([place]) 11/01/2012  . Chronic systolic heart failure ([place]) 10/27/2012  . S/P right TKA 05/10/2012  . Endometrial polyp 01/20/2012  . Breast cancer, stage 1 ([place]) 03/26/2011  . Unspecified vitamin D deficiency 03/26/2011    Past Surgical History:  Procedure Laterality Date  . BI-VENTRICULAR IMPLANTABLE CARDIOVERTER DEFIBRILLATOR N/A 05/03/2013   Procedure: BI-VENTRICULAR IMPLANTABLE CARDIOVERTER DEFIBRILLATOR  (CRT-D);  Surgeon: Evans Lance, MD;  Location: [facility] CATH LAB;  Service: Cardiovascular;  Laterality: N/A;  . BI-VENTRICULAR IMPLANTABLE CARDIOVERTER DEFIBRILLATOR  (CRT-D)  05/03/2013   MDT CRTD implanted by Dr Lovena Le for non ischemic  cardiomyopathy  . BREAST LUMPECTOMY Left 07/28/2010  . CARDIAC CATHETERIZATION  2010   NORMAL CORONARY ARTERIES  . 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--11 SURGERIES     PT HAS REMOVABLE SPEECH BULB-TAKES IT OUT BEFORE HER SURGERY  . COLONOSCOPY    . HYSTEROSCOPY W/D&C  01/20/2012   Procedure: DILATATION AND CURETTAGE /HYSTEROSCOPY;  Surgeon: Margarette Asal, MD;  Location: [facility];  Service: Gynecology;  Laterality: N/A;  with trueclear  . PORT-A-CATH REMOVAL  09/23/2011   Procedure: REMOVAL PORT-A-CATH;  Surgeon: Rolm Bookbinder, MD;  Location: [facility];  Service: General;  Laterality: N/A;  Port Removal  . PORTACATH PLACEMENT  2012  . TONSILLECTOMY  1960's  . TOTAL KNEE ARTHROPLASTY  05/10/2012   Procedure: TOTAL KNEE ARTHROPLASTY;  Surgeon: Mauri Pole, MD;  Location: [facility];  Service: Orthopedics;  Laterality: Right;  . WISDOM TOOTH EXTRACTION      OB History    No data available       Home Medications    Prior to Admission medications   Medication Sig Start Date End Date Taking? Authorizing Provider  ARIPiprazole (ABILIFY) 2 MG tablet Take 2 mg by mouth every morning.    Yes [provider]  buPROPion (WELLBUTRIN XL) 150 MG 24 hr tablet Take 450 mg by mouth every morning.    Yes [provider]  carvedilol (COREG) 6.25 MG tablet take 1 tablet by mouth every morning and 2 tablets by mouth every evening Patient taking differently: Take 6.25-12.5 mg by mouth See admin instructions. take 1 tablet by mouth every morning and 2 tablets by mouth every evening 06/04/17  Yes Larey Dresser, MD  citalopram (CELEXA) 10 MG tablet Take 10 mg by mouth daily.   Yes [provider]  clonazePAM (KLONOPIN) 0.5 MG tablet Take 0.5 mg by mouth 2 (two) times daily.    Yes [provider]  febuxostat (ULORIC) 40 MG tablet Take 40 mg by mouth at bedtime.    Yes [provider]  gabapentin (NEURONTIN) 300 MG capsule Take 300 mg by mouth 3 (three) times daily.     Yes [provider]  levothyroxine (SYNTHROID, LEVOTHROID) 125 MCG tablet Take 125 mcg by mouth daily before breakfast. On an empty stomach   Yes [provider]  montelukast (SINGULAIR) 10 MG tablet Take 10 mg by mouth at bedtime.  09/28/16  Yes [provider]  potassium chloride SA (K-DUR,KLOR-CON) 20 MEQ tablet Take 1 tablet (20 mEq total) daily by mouth. 04/12/17  Yes Clegg, Amy D, NP  simvastatin (ZOCOR) 10 MG tablet Take 10 mg by mouth every evening.    Yes [provider]  spironolactone (ALDACTONE) 25 MG  tablet Take 1 tablet (25 mg total) by mouth daily. 04/19/17  Yes Bensimhon, Shaune Pascal, MD  torsemide (DEMADEX) 20 MG tablet take 2 tablets by mouth every morning and 1 tablet every evening 03/10/17  Yes Larey Dresser, MD  zolpidem (AMBIEN) 10 MG tablet Take 10 mg by mouth at bedtime as needed. For sleep   Yes [provider]  metolazone (ZAROXOLYN) 2.5 MG tablet take 1 tablet by mouth if needed for FLUID OR EDEMA Patient not taking: Reported on 06/22/2017 05/07/17   Larey Dresser, MD    Family History Family History  Problem Relation Age of Onset  . Alzheimer's disease Mother   . CVA Mother   . Hypertension Mother   . Cancer - Colon Father        late 59  . Birth defects Paternal Uncle     Social History Social History   Tobacco Use  . Smoking status: Former Smoker    Packs/day: 0.10    Years: 26.00    Pack years: 2.60    Types: Cigarettes    Last attempt to quit: 05/06/2007    Years since quitting: 10.1  . Smokeless tobacco: Never Used  Substance Use Topics  . Alcohol use: No  . Drug use: No     Allergies   Ceftin; Shellfish allergy; Ativan [lorazepam]; Allopurinol; Lorazepam; Sulfa antibiotics; and Ultram [tramadol hcl]   Review of Systems Review of Systems Ten systems reviewed and are negative for acute change, except as noted in the HPI.    Physical Exam Updated Vital Signs BP 132/85   Pulse 62   Temp  98.4 F (36.9 C) (Oral)   SpO2 98%   Physical Exam  Constitutional: She is oriented to person, place, and time. She appears well-developed and well-nourished. No distress.  Nontoxic appearing and in no acute distress  HENT:  Head: Normocephalic and atraumatic.  Eyes: Conjunctivae and EOM are normal. No scleral icterus.  Neck: Normal range of motion.  Pulmonary/Chest: Effort normal. No respiratory distress.  Respirations even and unlabored  Musculoskeletal: Normal range of motion.  Neurological: She is alert and oriented to person, place, and time. She exhibits normal muscle tone. Coordination normal.  GCS 15.  Patient answers questions appropriately and follows commands.  Moving all extremities spontaneously.  Skin: Skin is warm and dry. No rash noted. She is not diaphoretic. No erythema. No pallor.  Psychiatric: She has a normal mood and affect. Her behavior is normal.  Nursing note and vitals reviewed.    ED Treatments / Results  Labs (all labs ordered are listed, but only abnormal results are displayed) Labs Reviewed  BASIC METABOLIC PANEL - Abnormal; Notable for the following components:      Result Value   Potassium 2.5 (*)    Chloride 87 (*)    BUN 38 (*)    Creatinine, Ser 2.06 (*)    GFR calc non Af Amer 24 (*)    GFR calc Af Amer 28 (*)    Anion gap 17 (*)    All other components within normal limits  CBC - Abnormal; Notable for the following components:   RBC 3.75 (*)    All other components within normal limits  I-STAT TROPONIN, ED    EKG  EKG Interpretation None       Radiology Dg Chest 2 View  Result Date: 06/21/2017 CLINICAL DATA:  Fall today, pacemaker. EXAM: CHEST  2 VIEW COMPARISON:  Chest x-ray dated 05/04/2013. FINDINGS: Left chest  wall pacemaker appears intact and stable in configuration. Pacemaker leads are grossly stable in position. Heart size is upper normal, stable. Atherosclerotic changes noted at the aortic arch. Lungs are clear. No  pleural effusion or pneumothorax seen. No osseous fracture or dislocation seen. Mild degenerative change again noted within the midthoracic spine. IMPRESSION: No acute findings.  Pacemaker hardware/leads unchanged. Electronically Signed   By: Franki Cabot M.D.   On: 06/21/2017 23:17    Procedures Procedures (including critical care time)  Medications Ordered in ED Medications  potassium chloride SA (K-DUR,KLOR-CON) CR tablet 80 mEq (80 mEq Oral Given 06/22/17 0345)    4:55 AM Received results from Medtronic interrogation.  Alert seems to be secondary to a RV defib lead warning as it crossed the threshold to 102 ohms.  Threshold is 100 ohms.  This also happened in December.  Patient had no evidence of episodes or arrhythmias.  This was discussed with Dr. Teena Dunk on call for cardiology.  He recommends follow-up with Dr. Lovena Le within the week.   Initial Impression / Assessment and Plan / ED Course  I have reviewed the triage vital signs and the nursing notes.  Pertinent labs & imaging results that were available during my care of the patient were reviewed by me and considered in my medical decision making (see chart for details).     67 year old female presents to the emergency department as she heard a beeping sound from her defibrillator prior to arrival.  She reports that this subsequently occurred while in the waiting room.  She had a history of similar symptoms earlier this month for which she was seen by her cardiologist, Dr. Lovena Le.  Patient hemodynamically stable since arrival in the emergency department.  Her laboratory workup is reassuring, but does show new hypokalemia.  This is likely secondary to decrease in the patient's dose of daily oral potassium.  The patient has been advised to resume her usual 40 mEq daily dosing.  The patient's device was interrogated.  This shows an alert secondary to right ventricular defib lead warning as it crossed the threshold of 100 ohms.  Medtronic  notes a similar episode in December.  She had no pace or shock terminated episodes.  No arrhythmias.  This was discussed with cardiology who advised close outpatient follow-up with Dr. Lovena Le in the office.  Plan discussed with patient who verbalizes understanding.  Patient discharged in stable condition with no unaddressed concerns.   Vitals:   06/21/17 2158 06/22/17 0335 06/22/17 0358 06/22/17 0400  BP: 123/77 (!) 162/83  132/85  Pulse: 74  62 62  Temp: 98.4 F (36.9 C)     TempSrc: Oral     SpO2: 99%  98% 98%    Final Clinical Impressions(s) / ED Diagnoses   Final diagnoses:  High threshold of implanted defibrillator  Hypokalemia    ED Discharge Orders    None       Antonietta Breach, PA-C 06/22/17 0551    Orpah Greek, MD 06/22/17 [phone]

## 2017-06-22 NOTE — Discharge Instructions (Signed)
Increase your potassium from 1 tablet of 86mEq daily to 2 tablets of 56mEq daily.  Follow-up with Dr. Lovena Le within the week to have your defibrillator reassessed.  You may return to the emergency department for new or concerning symptoms.

## 2017-06-23 DIAGNOSIS — I5022 Chronic systolic (congestive) heart failure: Secondary | ICD-10-CM | POA: Diagnosis not present

## 2017-06-23 DIAGNOSIS — I10 Essential (primary) hypertension: Secondary | ICD-10-CM | POA: Diagnosis not present

## 2017-06-23 NOTE — Telephone Encounter (Signed)
Pt called back, pt agreeable to apt on 06/25/17 at 10:30 for device clinic apt to increase defib impedance alert. Pt couldn't come any other day because of her husband working and she cant drive herself.

## 2017-06-23 NOTE — Telephone Encounter (Signed)
I spoke with patient yesterday afternoon and she said that she would like a follow up appointment.  Informed her that I would set her up an appointment and put her on our waiting list.

## 2017-06-25 ENCOUNTER — Ambulatory Visit (INDEPENDENT_AMBULATORY_CARE_PROVIDER_SITE_OTHER): Payer: Medicare HMO | Admitting: *Deleted

## 2017-06-25 DIAGNOSIS — I5022 Chronic systolic (congestive) heart failure: Secondary | ICD-10-CM | POA: Diagnosis not present

## 2017-06-25 LAB — CUP PACEART INCLINIC DEVICE CHECK
Date Time Interrogation Session: 20190201141439
Implantable Lead Implant Date: 20141210
Implantable Lead Implant Date: 20141210
Implantable Lead Implant Date: 20141210
Implantable Lead Location: 753858
Implantable Lead Location: 753859
Implantable Lead Location: 753860
Implantable Lead Model: 4396
Implantable Lead Model: 5076
Implantable Lead Model: 6935
Implantable Pulse Generator Implant Date: 20141210

## 2017-06-25 NOTE — Progress Notes (Signed)
Patient seen in clinic today for reprogramming of RV lead impedance from 1000ohms to 1500 ohms. No additional changes made.

## 2017-06-28 ENCOUNTER — Other Ambulatory Visit (HOSPITAL_COMMUNITY): Payer: Self-pay | Admitting: Pharmacist

## 2017-06-28 MED ORDER — POTASSIUM CHLORIDE CRYS ER 20 MEQ PO TBCR: 40.0000 meq | EXTENDED_RELEASE_TABLET | Freq: Every day | ORAL | 2 refills | Status: DC

## 2017-07-08 ENCOUNTER — Ambulatory Visit (INDEPENDENT_AMBULATORY_CARE_PROVIDER_SITE_OTHER): Payer: Medicare HMO | Admitting: *Deleted

## 2017-07-08 DIAGNOSIS — I428 Other cardiomyopathies: Secondary | ICD-10-CM | POA: Diagnosis not present

## 2017-07-08 DIAGNOSIS — I5022 Chronic systolic (congestive) heart failure: Secondary | ICD-10-CM | POA: Diagnosis not present

## 2017-07-08 DIAGNOSIS — Z95810 Presence of automatic (implantable) cardiac defibrillator: Secondary | ICD-10-CM

## 2017-07-08 NOTE — Progress Notes (Signed)
Remote ICD transmission.   

## 2017-07-09 NOTE — Progress Notes (Signed)
EPIC Encounter for ICM Monitoring  Patient Name: Rhonda [name] is a 67 y.o. female Date: 07/09/2017 Primary Care Physican: Maurice Small, MD Primary Cardiologist: [facility] Electrophysiologist: Lovena Le Dry Weight: does not weigh Bi-V Pacing: 100%       Attempted call to patient and unable to reach.   Transmission reviewed.    Thoracic impedance abnormal suggesting fluid accumulation since 06/27/2017.  Prescribed dosage: Torsemide 20 mg 2 tablets (40 mg total) every AM and 1 tablet (20 mg total)every PM. Potassium 20 meq 1 tablet daily. Metolazone 2.31m 1 tablet by mouth if needed for fluid or edema.  Labs: 06/21/2017 Creatinine 2.06, BUN 38, Potassium 2.5, Sodium 136, EGFR 24-28 drawn in ER and was given 80 mEq of Potassium during ER visit 10/25/2017Creatinine 1.35, BUN 32, Potassium 4.1, Sodium 139, EGFR 40-48 03/02/2016 Creatinine 1.72, BUN 44, Potassium 4.2, Sodium 135, EGFR 30-35  02/19/2016 Creatinine 1.77, BUN 44, Potassium 4.1, Sodium 136, EGFR 29-34  07/10/2015 Creatinine 1.49, BUN 23, Potassium 4.3, Sodium 140, EGFR 36-42  Recommendations: NONE - Unable to reach.  Follow-up plan: ICM clinic phone appointment on 07/15/2017 to recheck fluid levels.  Copy of ICM check sent to Dr. TLovena Leand Dr. BHaroldine Laws   3 month ICM trend: 07/09/2017    1 Year ICM trend:       [name] RN 07/09/2017 1:14 PM

## 2017-07-14 ENCOUNTER — Encounter: Payer: Self-pay | Admitting: Cardiology

## 2017-07-15 ENCOUNTER — Ambulatory Visit (INDEPENDENT_AMBULATORY_CARE_PROVIDER_SITE_OTHER): Payer: Medicare HMO

## 2017-07-15 DIAGNOSIS — I5022 Chronic systolic (congestive) heart failure: Secondary | ICD-10-CM

## 2017-07-15 DIAGNOSIS — Z95810 Presence of automatic (implantable) cardiac defibrillator: Secondary | ICD-10-CM

## 2017-07-16 NOTE — Progress Notes (Signed)
She needs followup with me in CHF clinic in the next week.  Please arrange.

## 2017-07-16 NOTE — Progress Notes (Signed)
EPIC Encounter for ICM Monitoring  Patient Name: Rhonda [name] is a 67 y.o. female Date: 07/16/2017 Primary Care Physican: Maurice Small, MD Primary Cardiologist: Aundra Dubin Electrophysiologist: Lovena Le Dry Weight: 228 lbs at last office visit (not weighing at home) Bi-V Pacing: 100%        Heart Failure questions reviewed, pt symptomatic with legs swollen and red.  Patient eating soup must of the time because of dental work.  She is not able chew.  She has a scale at home but the units is not showing pounds and she does not know how to change it.  Advised    Thoracic impedance abnormal suggesting fluid accumulation since 06/26/2017.  Prescribed dosage: Torsemide 20 mg 2 tablets (40 mg total) every AM and 1 tablet (20 mg total)every PM. Potassium 20 meq 2 tablets daily. Metolazone 2.7m 1 tablet by mouth if needed for fluid or edema.  Labs: 06/21/2017 Creatinine 2.06, BUN 38, Potassium 2.5, Sodium 136, EGFR 24-28 drawn in ER and was given 80 mEq of Potassium during ER visit 10/25/2017Creatinine 1.35, BUN 32, Potassium 4.1, Sodium 139, EGFR 40-48 03/02/2016 Creatinine 1.72, BUN 44, Potassium 4.2, Sodium 135, EGFR 30-35  02/19/2016 Creatinine 1.77, BUN 44, Potassium 4.1, Sodium 136, EGFR 29-34  07/10/2015 Creatinine 1.49, BUN 23, Potassium 4.3, Sodium 140, EGFR 36-42  Recommendations:   Patient will take 1 tablet of PRN Metolazone 30 minutes before Torsemide today as prescribed.  Advised to increase Potassium 20 mEq 2 tablets AM and 2 tablets PM today and tomorrow.    Follow-up plan: ICM clinic phone appointment on 07/19/2017 (Manual send).  Advised to make a 6 month f/u appointment with Dr MAundra Dubin she was due in January.  She said she has difficulty with making an appointment at HF clinic. Advised I will send a message to have the HF clinic call her.   Copy of ICM check sent to Dr. MAundra Dubinand Dr. TLovena Lefor review and any further recommendations if needed.    3 month ICM trend:  07/15/2017    1 Year ICM trend:       [name] RN 07/16/2017 9:40 AM

## 2017-07-19 ENCOUNTER — Telehealth: Payer: Self-pay | Admitting: Cardiology

## 2017-07-19 ENCOUNTER — Telehealth (HOSPITAL_COMMUNITY): Payer: Self-pay | Admitting: *Deleted

## 2017-07-19 ENCOUNTER — Ambulatory Visit (INDEPENDENT_AMBULATORY_CARE_PROVIDER_SITE_OTHER): Payer: Self-pay

## 2017-07-19 DIAGNOSIS — Z95810 Presence of automatic (implantable) cardiac defibrillator: Secondary | ICD-10-CM

## 2017-07-19 DIAGNOSIS — I5022 Chronic systolic (congestive) heart failure: Secondary | ICD-10-CM

## 2017-07-19 NOTE — Progress Notes (Signed)
Left VM for pt to call and schedule appt

## 2017-07-19 NOTE — Telephone Encounter (Signed)
-----   Message from [name], RN sent at 07/19/2017  7:57 AM EST ----- Regarding: Aundra Dubin Appt this week Dr Aundra Dubin wants to have patient follow up in clinic with him this week due to Optivol impedance is decreased and she has red swollen legs.  Can you call her for an appointment?    Thanks! [name]

## 2017-07-19 NOTE — Telephone Encounter (Signed)
Left VM on home phone for pt to call back and schedule appointment. Patient can see Dr.McLean or APP clinic. (see note below)    Short, Laurie Panda, RN  Schub, Marsh Dolly, RN; Effie Berkshire, RN; Harvie Junior, [place]        Dr Aundra Dubin wants to have patient follow up in clinic with him this week due to Optivol impedance is decreased and she has red swollen legs. Can you call her for an appointment?    Thanks!  [name]

## 2017-07-19 NOTE — Telephone Encounter (Signed)
Spoke with pt and reminded pt of remote transmission that is due today. Pt verbalized understanding.   

## 2017-07-20 NOTE — Progress Notes (Signed)
EPIC Encounter for ICM Monitoring  Patient Name: Rhonda [name] is a 67 y.o. female Date: 07/20/2017 Primary Care Physican: Maurice Small, MD Primary Cardiologist: Aundra Dubin Electrophysiologist: Lovena Le Dry Weight: 228 lbs at last office visit (not weighing at home) Bi-V Pacing: 100%      Heart Failure questions reviewed, pt feet and legs remain swollen.    Thoracic impedance improved but continues to be abnormal even after taking a Metolazone on 07/13/2017.    Prescribed dosage: Torsemide 20 mg 2 tablets (40 mg total) every AM and 1 tablet (20 mg total)every PM. Potassium 20 meq 2 tablets daily. Metolazone 2.56m 1 tablet by mouth if needed for fluid or edema.  Labs: 06/21/2017 Creatinine 2.06, BUN 38, Potassium 2.5, Sodium 136, EGFR 24-28 drawn in ER and was given 80 mEq of Potassium during ER visit 10/25/2017Creatinine 1.35, BUN 32, Potassium 4.1, Sodium 139, EGFR 40-48 03/02/2016 Creatinine 1.72, BUN 44, Potassium 4.2, Sodium 135, EGFR 30-35  02/19/2016 Creatinine 1.77, BUN 44, Potassium 4.1, Sodium 136, EGFR 29-34  07/10/2015 Creatinine 1.49, BUN 23, Potassium 4.3, Sodium 140, EGFR 36-42  Recommendations:  Advised will call back if there are any recommendations.   Encouraged to call if leg swelling worsens or she develops other fluid symptoms.   Follow-up plan: ICM clinic phone appointment on 08/05/2017.  Office appointment scheduled 07/28/2017 with Dr. MAundra Dubin  Copy of ICM check sent to Dr. TLovena Leand Dr. MAundra Dubin   3 month ICM trend: 07/19/2017    1 Year ICM trend:       [name] RN 07/20/2017 10:07 AM

## 2017-07-23 ENCOUNTER — Ambulatory Visit: Payer: Medicare HMO | Admitting: Neurology

## 2017-07-23 ENCOUNTER — Encounter: Payer: Self-pay | Admitting: Neurology

## 2017-07-23 ENCOUNTER — Other Ambulatory Visit (HOSPITAL_COMMUNITY): Payer: Self-pay | Admitting: *Deleted

## 2017-07-23 VITALS — BP 110/70 | HR 86 | Ht 61.0 in | Wt 228.1 lb

## 2017-07-23 DIAGNOSIS — R413 Other amnesia: Secondary | ICD-10-CM

## 2017-07-23 DIAGNOSIS — M542 Cervicalgia: Secondary | ICD-10-CM | POA: Diagnosis not present

## 2017-07-23 MED ORDER — TORSEMIDE 20 MG PO TABS: ORAL_TABLET | ORAL | 3 refills | Status: DC

## 2017-07-23 NOTE — Progress Notes (Signed)
Increase torsemide to 40 mg bid until office appt.  Needs BMET early next week.

## 2017-07-23 NOTE — Patient Instructions (Signed)
Start home exercises   Formal neurocognitive testing  MRI brain and cervical spine without contrast - please provide details of your ICD to [facility].  If your device is not MRI-compatible, please call my office and we will order CT instead

## 2017-07-23 NOTE — Progress Notes (Signed)
Attempted call to patient and left message to call back. Will advise patient when reached that Dr Aundra Dubin increased torsemide to 40 mg bid until office appt with BMET early next week.
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
## 2017-07-23 NOTE — Addendum Note (Signed)
Addended by: [name] on: 07/23/2017 04:27 PM   Modules accepted: Orders

## 2017-07-23 NOTE — Progress Notes (Signed)
Call to patient and advised Dr Aundra Dubin ordered to increase Torsemide to 40 mg bid until office appt on 3/6 with BMET early next week.  BMET scheduled for 3/6 at time of office visit. She verbalized understanding and said she wrote down instructions.

## 2017-07-25 LAB — CUP PACEART REMOTE DEVICE CHECK
Battery Remaining Longevity: 23 mo
Battery Voltage: 2.94 V
Brady Statistic AP VP Percent: 0.03 %
Brady Statistic AP VS Percent: 0.01 %
Brady Statistic AS VP Percent: 99.94 %
Brady Statistic AS VS Percent: 0.02 %
Brady Statistic RA Percent Paced: 0.04 %
Brady Statistic RV Percent Paced: 99.96 %
Date Time Interrogation Session: 20190214122405
HighPow Impedance: 77 Ohm
Implantable Lead Implant Date: 20141210
Implantable Lead Implant Date: 20141210
Implantable Lead Implant Date: 20141210
Implantable Lead Location: 753858
Implantable Lead Location: 753859
Implantable Lead Location: 753860
Implantable Lead Model: 4396
Implantable Lead Model: 5076
Implantable Lead Model: 6935
Implantable Pulse Generator Implant Date: 20141210
Lead Channel Impedance Value: 399 Ohm
Lead Channel Impedance Value: 399 Ohm
Lead Channel Impedance Value: 494 Ohm
Lead Channel Impedance Value: 570 Ohm
Lead Channel Impedance Value: 665 Ohm
Lead Channel Impedance Value: 703 Ohm
Lead Channel Pacing Threshold Amplitude: 0.625 V
Lead Channel Pacing Threshold Amplitude: 0.875 V
Lead Channel Pacing Threshold Amplitude: 1.25 V
Lead Channel Pacing Threshold Pulse Width: 0.4 ms
Lead Channel Pacing Threshold Pulse Width: 0.4 ms
Lead Channel Pacing Threshold Pulse Width: 0.4 ms
Lead Channel Sensing Intrinsic Amplitude: 28.75 mV
Lead Channel Sensing Intrinsic Amplitude: 28.75 mV
Lead Channel Sensing Intrinsic Amplitude: 3 mV
Lead Channel Sensing Intrinsic Amplitude: 3 mV
Lead Channel Setting Pacing Amplitude: 2 V
Lead Channel Setting Pacing Amplitude: 2.5 V
Lead Channel Setting Pacing Amplitude: 2.5 V
Lead Channel Setting Pacing Pulse Width: 0.4 ms
Lead Channel Setting Pacing Pulse Width: 0.4 ms
Lead Channel Setting Sensing Sensitivity: 0.3 mV

## 2017-07-26 ENCOUNTER — Telehealth: Payer: Self-pay | Admitting: Neurology

## 2017-07-26 NOTE — Telephone Encounter (Signed)
Left message for patient giving her the instructions. 

## 2017-07-26 NOTE — Telephone Encounter (Signed)
-----   Message from Larey Dresser, MD sent at 07/25/2017  8:02 PM EST ----- I would start with decreasing her Coreg to 6.25 mg bid (from 6.25 qam/12.5 mg qpm) and decrease spironolactone to 12.5 mg daily and take in the evening.  See if this helps orthostasis first before giving her midodrine.  Thanks. Dalton ----- Message ----- From: Alda Berthold, DO Sent: 07/23/2017   5:15 PM To: Larey Dresser, MD  Hi Dr. Aundra Dubin, hope this note reached you well.  I saw Mrs. Waight today for evaluation of frequent falls.  Her orthostatic vital signs are positive and I wanted to get your opinion on whether you are okay with me starting low dose midodrine 5mg  BID; given her CHF, I will avoid florinef. Appreciate your thoughts.  Thanks, [employer]

## 2017-07-26 NOTE — Telephone Encounter (Signed)
Rhonda [name], please call the patient and have the patient reduce her blood pressure medications, as recommended by Dr. Aundra Dubin to see if this helps with her lightheadedness. Thanks.   Kesia Dalto K. [name], DO

## 2017-07-28 ENCOUNTER — Ambulatory Visit (HOSPITAL_COMMUNITY)
Admission: RE | Admit: 2017-07-28 | Discharge: 2017-07-28 | Disposition: A | Discharge status: Home / Self Care | Payer: Medicare HMO | Source: Ambulatory Visit | Attending: Internal Medicine | Admitting: Internal Medicine

## 2017-07-28 ENCOUNTER — Encounter (HOSPITAL_COMMUNITY): Payer: Self-pay

## 2017-07-28 VITALS — BP 120/72 | HR 83 | Wt 224.8 lb

## 2017-07-28 DIAGNOSIS — F419 Anxiety disorder, unspecified: Secondary | ICD-10-CM | POA: Diagnosis not present

## 2017-07-28 DIAGNOSIS — Z9981 Dependence on supplemental oxygen: Secondary | ICD-10-CM | POA: Insufficient documentation

## 2017-07-28 DIAGNOSIS — Z79890 Hormone replacement therapy (postmenopausal): Secondary | ICD-10-CM | POA: Diagnosis not present

## 2017-07-28 DIAGNOSIS — E039 Hypothyroidism, unspecified: Secondary | ICD-10-CM | POA: Insufficient documentation

## 2017-07-28 DIAGNOSIS — Z79899 Other long term (current) drug therapy: Secondary | ICD-10-CM | POA: Diagnosis not present

## 2017-07-28 DIAGNOSIS — I447 Left bundle-branch block, unspecified: Secondary | ICD-10-CM | POA: Insufficient documentation

## 2017-07-28 DIAGNOSIS — M109 Gout, unspecified: Secondary | ICD-10-CM | POA: Diagnosis not present

## 2017-07-28 DIAGNOSIS — J9610 Chronic respiratory failure, unspecified whether with hypoxia or hypercapnia: Secondary | ICD-10-CM | POA: Diagnosis not present

## 2017-07-28 DIAGNOSIS — I951 Orthostatic hypotension: Secondary | ICD-10-CM

## 2017-07-28 DIAGNOSIS — F329 Major depressive disorder, single episode, unspecified: Secondary | ICD-10-CM | POA: Insufficient documentation

## 2017-07-28 DIAGNOSIS — E1140 Type 2 diabetes mellitus with diabetic neuropathy, unspecified: Secondary | ICD-10-CM | POA: Diagnosis not present

## 2017-07-28 DIAGNOSIS — I428 Other cardiomyopathies: Secondary | ICD-10-CM | POA: Diagnosis not present

## 2017-07-28 DIAGNOSIS — Z853 Personal history of malignant neoplasm of breast: Secondary | ICD-10-CM | POA: Insufficient documentation

## 2017-07-28 DIAGNOSIS — I159 Secondary hypertension, unspecified: Secondary | ICD-10-CM

## 2017-07-28 DIAGNOSIS — K219 Gastro-esophageal reflux disease without esophagitis: Secondary | ICD-10-CM | POA: Insufficient documentation

## 2017-07-28 DIAGNOSIS — J45909 Unspecified asthma, uncomplicated: Secondary | ICD-10-CM | POA: Insufficient documentation

## 2017-07-28 DIAGNOSIS — I5022 Chronic systolic (congestive) heart failure: Secondary | ICD-10-CM | POA: Insufficient documentation

## 2017-07-28 DIAGNOSIS — E785 Hyperlipidemia, unspecified: Secondary | ICD-10-CM | POA: Diagnosis not present

## 2017-07-28 DIAGNOSIS — I110 Hypertensive heart disease with heart failure: Secondary | ICD-10-CM | POA: Diagnosis not present

## 2017-07-28 DIAGNOSIS — Z95810 Presence of automatic (implantable) cardiac defibrillator: Secondary | ICD-10-CM | POA: Insufficient documentation

## 2017-07-28 HISTORY — DX: Orthostatic hypotension: I95.1

## 2017-07-28 LAB — BASIC METABOLIC PANEL
Anion gap: 15 (ref 5–15)
BUN: 53 mg/dL — ABNORMAL HIGH (ref 6–20)
CO2: 29 mmol/L (ref 22–32)
Calcium: 8.9 mg/dL (ref 8.9–10.3)
Chloride: 95 mmol/L — ABNORMAL LOW (ref 101–111)
Creatinine, Ser: 1.69 mg/dL — ABNORMAL HIGH (ref 0.44–1.00)
GFR calc Af Amer: 36 mL/min — ABNORMAL LOW (ref >60)
GFR calc non Af Amer: 31 mL/min — ABNORMAL LOW (ref >60)
Glucose, Bld: 122 mg/dL — ABNORMAL HIGH (ref 65–99)
Potassium: 3 mmol/L — ABNORMAL LOW (ref 3.5–5.1)
Sodium: 139 mmol/L (ref 135–145)

## 2017-07-28 LAB — CBC
HCT: 34.9 % — ABNORMAL LOW (ref 36.0–46.0)
Hemoglobin: 11.8 g/dL — ABNORMAL LOW (ref 12.0–15.0)
MCH: 33.4 pg (ref 26.0–34.0)
MCHC: 33.8 g/dL (ref 30.0–36.0)
MCV: 98.9 fL (ref 78.0–100.0)
Platelets: 278 10*3/uL (ref 150–400)
RBC: 3.53 MIL/uL — ABNORMAL LOW (ref 3.87–5.11)
RDW: 13.6 % (ref 11.5–15.5)
WBC: 6.2 10*3/uL (ref 4.0–10.5)

## 2017-07-28 MED ORDER — TORSEMIDE 20 MG PO TABS: 40.0000 mg | ORAL_TABLET | Freq: Every day | ORAL | 3 refills | Status: DC

## 2017-07-28 MED ORDER — POTASSIUM CHLORIDE CRYS ER 20 MEQ PO TBCR: 20.0000 meq | EXTENDED_RELEASE_TABLET | Freq: Every day | ORAL | 3 refills | Status: DC

## 2017-07-28 NOTE — Patient Instructions (Addendum)
HOLD spironolactone, Torsemide, and Potassium until Friday.  Beginning Friday: Restart Spironolactone 25 mg tablet once daily. Restart Torsemide 40 mg (2 tabs) ONCE daily. Restart Potassium 20 meq (1 TAB) once daily.  Routine lab work today. Will notify you of abnormal results, otherwise no news is good news!  Wear compression stockings daily. Remove for sleeping and bathing. Available at any medical supply store (take your prescription paper with you). Rhonda [name] (closes medical store from here): Address: [address] [place], [place] [address]  Phone: [phone]  Follow up 2 weeks with Rhonda Kilts PA-C.  _____________________________________________________________ Rhonda [name] Code: 9002  Take all medication as prescribed the day of your appointment. Bring all medications with you to your appointment.  Do the following things EVERYDAY: 1) Weigh yourself in the morning before breakfast. Write it down and keep it in a log. 2) Take your medicines as prescribed 3) Eat low salt foods-Limit salt (sodium) to 2000 mg per day.  4) Stay as active as you can everyday 5) Limit all fluids for the day to less than 2 liters

## 2017-07-28 NOTE — Progress Notes (Signed)
Patient ID: Rhonda [name], female   DOB: 08-22-1950, 67 y.o.   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 She has had sleep study with no  evidence of sleep apnea.  She has a Medtronic CRT-D device.  Last echo in 7/18 showed recovery of EF to 55-60%.   She presents today for add on due to LE edema.  Torsemide increased and has been taking metolazone as needed.  Weight is actually down 10 lbs since we saw last, though have not seen since July 2018. She recently had all of her teeth taken out. She has had dentures made, but has not been able to use them yet with continued pain and swelling in her gums.   Of note, she has had > 10 falls in the past 2 months.   Saw neurology 07/23/17 and they are working up from a neuro standpoint.  It HAS been worse since increase of torsemide.  No injury, thus far.  She denies CP or SOB.  Edema has improved.  + Orthostatics on exam. See below.   Optivol reviewed: Thoracic impedence above threshold. Fluid index precipitously dropped off since increasing torsemide. Pt activity > 1 hr daily.   10/14/12 ECHO EF 35% RV ok  04/12/13 ECHO EF 20-25%, LV moderately dilated 10/11/2013 ECHO EF 45-50% 7/18 ECHO EF 55-60%, normal RV size and systolic function, PASP 34 mmHg  11/24/12 Creatinine 1.23 Potassium 4.1 12/15/12 Creatinine 2.03 Potassium 3.3  12/22/12 Creatinine 1.2 Potassium 3.8 02/23/13 Creatinine 1.68 K 4.4  05/02/13 K 4.2 Creatinine 1.8  10/11/13 K 3.7 Creatinine 1.69 10/17 K 4.1,  creatinine 1.35  Review of systems complete and found to be negative unless listed in HPI.    Past Medical History:  Diagnosis Date  . Anemia   . Anxiety   . Arthritis    RIGHT KNEE ARTHRITIS AND PAIN-PT TOLD BONE ON BONE  . Asthma   . Back pain    DISK PROBLEM  . Breast cancer, stage 1 ([facility]) 03/26/2011   left-FINISHED CHEMO  AND RADIATION  . Cardiomyopathy    [name]  . Chronic systolic heart failure ([facility])    a) NICM b) ECHO (03/2013) EF 20-25% c) ECHO (09/2013) EF 45-50%, grade I DD  . Complication of anesthesia    PT STATES HER B/P LOW AFTER ONE OF HER SURGERIES--SHE ATTRIBUTES TO LYING FLAT  . Depression   . Diabetes mellitus    "diet controlled" (05/04/2103)  . Exertional shortness of breath   . GERD (gastroesophageal reflux disease)   . Gout   . Heart murmur   . Hyperlipidemia   . Hypertension   . Hypothyroidism   . Left bundle branch block    s/p CRT-D (04/2013)  . Migraines   . On home oxygen therapy    "2L suppose to be q night" (05/03/2013)  . Peripheral neuropathy    feet  . SVD (spontaneous vaginal delivery)    x 2  . Unspecified vitamin D deficiency 03/26/2011  does not take meds    Current Outpatient Medications  Medication Sig Dispense Refill  . ARIPiprazole (ABILIFY) 2 MG tablet Take 2 mg by mouth every morning.     Marland Kitchen buPROPion (WELLBUTRIN XL) 150 MG 24 hr tablet Take 450 mg by mouth every morning.     . carvedilol (COREG) 6.25 MG tablet take 1 tablet by mouth every morning and 2 tablets by mouth every evening (Patient taking differently: Take 6.25-12.5 mg by mouth See admin instructions. take 1 tablet by mouth every morning and 2 tablets by mouth every evening) 90 tablet 3  . citalopram (CELEXA) 10 MG tablet Take 10 mg by mouth daily.    . clonazePAM (KLONOPIN) 0.5 MG tablet Take 0.5 mg by mouth 2 (two) times daily.     . febuxostat (ULORIC) 40 MG tablet Take 40 mg by mouth at bedtime.     . gabapentin (NEURONTIN) 300 MG  capsule Take 300 mg by mouth 3 (three) times daily.     Marland Kitchen levothyroxine (SYNTHROID, LEVOTHROID) 125 MCG tablet Take 125 mcg by mouth daily before breakfast. On an empty stomach    . metolazone (ZAROXOLYN) 2.5 MG tablet take 1 tablet by mouth if needed for FLUID OR EDEMA (Patient not taking: Reported on 06/22/2017) 30 tablet 2  . montelukast (SINGULAIR) 10 MG tablet Take 10 mg by mouth at bedtime.   1  . potassium chloride SA (K-DUR,KLOR-CON) 20 MEQ tablet Take 2 tablets (40 mEq total) by mouth daily. 60 tablet 2  . simvastatin (ZOCOR) 10 MG tablet Take 10 mg by mouth every evening.     Marland Kitchen spironolactone (ALDACTONE) 25 MG tablet Take 1 tablet (25 mg total) by mouth daily. 30 tablet 3  . torsemide (DEMADEX) 20 MG tablet Take 2 tablets (40 mg total) by mouth two times daily. 360 tablet 3  . zolpidem (AMBIEN) 10 MG tablet Take 10 mg by mouth at bedtime as needed. For sleep     Current Facility-Administered Medications  Medication Dose Route Frequency Provider Last Rate Last Dose  . triamcinolone acetonide (KENALOG-40) injection 20 mg  20 mg Other Once Landis Martins, DPM        Vitals:   07/28/17 1512  BP: 120/72  Pulse: 83  SpO2: 99%  Weight: 224 lb 12.8 oz (102 kg)   Orthostatics - Sitting 116/palpation - Standing 86/palpation  Wt Readings from Last 3 Encounters:  07/28/17 224 lb 12.8 oz (102 kg)  07/23/17 228 lb 2 oz (103.5 kg)  06/04/17 228 lb (103.4 kg)    PHYSICAL EXAM: General: Fatigued. No resp difficulty. HEENT: Normal Neck: Supple. JVP flat. Carotids 2+ bilat; no bruits. No thyromegaly or nodule noted. Cor: PMI nondisplaced. RRR, No M/G/R noted Lungs: CTAB, normal effort. Abdomen: Soft, non-tender, non-distended, no HSM. No bruits or masses. +BS  Extremities: No cyanosis, clubbing, or rash. Trace ankle edema with BLE varicosities.  Neuro: Alert & orientedx3, cranial nerves grossly intact. moves all 4 extremities w/o difficulty. Affect pleasant   ASSESSMENT &  PLAN:  1) Chronic systolic HF: NICM s/p Medtronic CRT-D, ?Viral myocarditis. Cardiomyopathy dates from 46. EF 45-50% (09/2013), EF up to 55-60% on 7/18 echo.  - NYHA class II.  - She is orthostatic on exam, and thoracic impedence dry on Optivol.  - Hold torsemide tonight and tomorrow, then decreased to 40 mg daily. BMET today. Liberalize fluid intake today and tomorrow.  - Decrease coreg to 6.25 mg BID.  - Hold spiro until Friday. 2) Obesity - Weight  is stable to improved from last visit.  She is not significantly volume overloaded on exam.  3) HTN - Orthostatic. 4) Orthostatic Hypotension - SBP from 116 -> 86 sitting to standing.  Meds adjusted as above.  - We are OK with starting on low dose midodrine per neuro. Will forward todays note. Would recommend seeing how she equilibrates with diuretic adjustment. We will see back in 2 weeks and advise.  She may end up requiring midodrine for sufficient diuresis to maintain euvolemia.   RTC 2 weeks with orthostasis. Meds as above.   Satira Mccallum [facility] 07/28/2017

## 2017-08-04 DIAGNOSIS — J309 Allergic rhinitis, unspecified: Secondary | ICD-10-CM | POA: Diagnosis not present

## 2017-08-04 DIAGNOSIS — Z1211 Encounter for screening for malignant neoplasm of colon: Secondary | ICD-10-CM | POA: Diagnosis not present

## 2017-08-05 ENCOUNTER — Telehealth: Payer: Self-pay

## 2017-08-05 ENCOUNTER — Ambulatory Visit (INDEPENDENT_AMBULATORY_CARE_PROVIDER_SITE_OTHER): Payer: Self-pay

## 2017-08-05 DIAGNOSIS — F0632 Mood disorder due to known physiological condition with major depressive-like episode: Secondary | ICD-10-CM | POA: Diagnosis not present

## 2017-08-05 DIAGNOSIS — Z95810 Presence of automatic (implantable) cardiac defibrillator: Secondary | ICD-10-CM

## 2017-08-05 DIAGNOSIS — F3289 Other specified depressive episodes: Secondary | ICD-10-CM | POA: Diagnosis not present

## 2017-08-05 DIAGNOSIS — I5022 Chronic systolic (congestive) heart failure: Secondary | ICD-10-CM

## 2017-08-05 NOTE — Progress Notes (Signed)
EPIC Encounter for ICM Monitoring  Patient Name: Rhonda [name] is a 67 y.o. female Date: 08/05/2017 Primary Care Physican: Maurice Small, MD Primary [facility] Electrophysiologist: Lovena Le Dry Weight: 228 lbs at last office visit (not weighing at home) Bi-V Pacing: 100%      Attempted call to patient and unable to reach.  Left message to return call.  Transmission reviewed.    Thoracic impedance abnormal suggesting fluid accumulation since 07/27/2017.  Prescribed dosage: Torsemide 20 mg 2 tablets (40 mg total) daily (decreased on 07/28/2017 by HF clinic due to patient was dry).  Potassium 20 meq 1tabletdaily. Metolazone 2.23m 1 tablet by mouth if needed for fluid or edema.  Labs: 07/28/2017 Creatinine 1.69, BUN 53, Potassium 3.0, Sodium 139, EGFR 31-36 (low potassium addressed by HF clinic) 06/21/2017 Creatinine 2.06, BUN 38, Potassium 2.5, Sodium 136, EGFR 24-28 drawn in ER and was given 80 mEq of Potassium during ER visit 10/25/2017Creatinine 1.35, BUN 32, Potassium 4.1, Sodium 139, EGFR 40-48 03/02/2016 Creatinine 1.72, BUN 44, Potassium 4.2, Sodium 135, EGFR 30-35  02/19/2016 Creatinine 1.77, BUN 44, Potassium 4.1, Sodium 136, EGFR 29-34  07/10/2015 Creatinine 1.49, BUN 23, Potassium 4.3, Sodium 140, EGFR 36-42  Recommendations: NONE - Unable to reach.  Follow-up plan: ICM clinic phone appointment on 08/19/2017.  Office appointment scheduled 08/11/2017 with HF clinic NP/PA.  Copy of ICM check sent to Dr. MAundra Dubinand Dr. TLovena Leand if any recommendations will call back.   3 month ICM trend: 08/05/2017    1 Year ICM trend:       [name] RN 08/05/2017 9:32 AM

## 2017-08-05 NOTE — Progress Notes (Signed)
She has gained 8-10 lbs 224 lbs to 233 or 234 lbs in the last 2 weeks and only weighs at physicians offices. She does not weigh at home.  Confirmed Torsemide dosage of 40 mg total daily (decreased on 07/28/2017 by HF clinic due to patient was dry).    She denied any fluid symptoms but does have a sinus congestion and prescribed prednisone yesterday.  Office appointment scheduled 08/11/2017 with HF clinic NP/PA.  Advised if any recommendations will call her back.

## 2017-08-05 NOTE — Telephone Encounter (Signed)
Remote ICM transmission received.  Attempted call to patient and left message to return call. 

## 2017-08-06 ENCOUNTER — Encounter (HOSPITAL_COMMUNITY): Payer: Self-pay | Admitting: Cardiology

## 2017-08-06 MED ORDER — TORSEMIDE 20 MG PO TABS: ORAL_TABLET | ORAL | 3 refills | Status: DC

## 2017-08-06 NOTE — Progress Notes (Signed)
Need to increase torsemide to 40 qam/20 qpm with BMET in 1 week.

## 2017-08-06 NOTE — Progress Notes (Signed)
  Attempted call to patient and requested call back to provide Dr Claris Gladden recommendations.

## 2017-08-06 NOTE — Progress Notes (Signed)
Letter mailed

## 2017-08-06 NOTE — Progress Notes (Signed)
Patient returned call.  Advised Dr Aundra Dubin ordered to take Torsemide 20 mg take 2 tablets (40 mg) every morning and 1 tablet every afternoon. BMET in 1 week. She agreed and verbalized understanding.

## 2017-08-06 NOTE — Addendum Note (Signed)
Addended by: [name] on: 08/06/2017 05:43 PM   Modules accepted: Orders

## 2017-08-11 ENCOUNTER — Encounter (HOSPITAL_COMMUNITY): Payer: Medicare HMO

## 2017-08-11 ENCOUNTER — Telehealth: Payer: Self-pay | Admitting: Neurology

## 2017-08-11 NOTE — Telephone Encounter (Signed)
[facility] Scheduling left a voicemail message regarding pt and 2 orders for a MRI, please call regarding this CB# [phone] Press#2

## 2017-08-11 NOTE — Telephone Encounter (Signed)
Left message for [facility] to call me back.

## 2017-08-13 ENCOUNTER — Other Ambulatory Visit (HOSPITAL_COMMUNITY): Payer: Medicare HMO

## 2017-08-13 ENCOUNTER — Ambulatory Visit: Payer: Medicare HMO | Admitting: Neurology

## 2017-08-18 ENCOUNTER — Other Ambulatory Visit (HOSPITAL_COMMUNITY): Payer: Self-pay | Admitting: *Deleted

## 2017-08-18 ENCOUNTER — Telehealth (HOSPITAL_COMMUNITY): Payer: Self-pay | Admitting: *Deleted

## 2017-08-18 NOTE — Telephone Encounter (Signed)
Returning patient's call from yesterday afternoon, had to leave VM on both lines.  Will continue to try and reach patient.

## 2017-08-19 ENCOUNTER — Ambulatory Visit (INDEPENDENT_AMBULATORY_CARE_PROVIDER_SITE_OTHER): Payer: Medicare HMO

## 2017-08-19 DIAGNOSIS — I5022 Chronic systolic (congestive) heart failure: Secondary | ICD-10-CM

## 2017-08-19 DIAGNOSIS — Z95810 Presence of automatic (implantable) cardiac defibrillator: Secondary | ICD-10-CM | POA: Diagnosis not present

## 2017-08-19 NOTE — Progress Notes (Signed)
EPIC Encounter for ICM Monitoring  Patient Name: Rhonda [name] is a 67 y.o. female Date: 08/19/2017 Primary Care Physican: Maurice Small, MD Primary [place] Electrophysiologist: Lovena Le Dry Weight: Previous ICM 238 lbs   Bi-V Pacing: 100%           Attempted call to patient and unable to reach.  Transmission reviewed.    Patient did not have BMET drawn on 08/13/2017 as ordered.   Thoracic impedance returned to normal on 08/07/2017 after Torsemide increased to 40 mg every AM and 20 mg every PM on 08/06/2017.  Prescribed dosage: Torsemide 20 mg 2 tablets (40 mg total) daily (decreased on 07/28/2017 by HF clinic due to patient was dry).  Potassium 20 meq 1tabletdaily. Metolazone 2.38m 1 tablet by mouth if needed for fluid or edema.  Labs: 07/28/2017 Creatinine 1.69, BUN 53, Potassium 3.0, Sodium 139, EGFR 31-36 (low potassium addressed by HF clinic) 06/21/2017 Creatinine 2.06, BUN 38, Potassium 2.5, Sodium 136, EGFR 24-28 drawn in ER and was given 80 mEq of Potassium during ER visit 10/25/2017Creatinine 1.35, BUN 32, Potassium 4.1, Sodium 139, EGFR 40-48 03/02/2016 Creatinine 1.72, BUN 44, Potassium 4.2, Sodium 135, EGFR 30-35  02/19/2016 Creatinine 1.77, BUN 44, Potassium 4.1, Sodium 136, EGFR 29-34  07/10/2015 Creatinine 1.49, BUN 23, Potassium 4.3, Sodium 140, EGFR 36-42  Recommendations: NONE - Unable to reach.  Follow-up plan: ICM clinic phone appointment on 09/20/2017.  Office appointment scheduled 08/25/2017 with HF clinic NP/PA.  Copy of ICM check sent to Dr. TLovena Le   3 month ICM trend: 08/19/2017    1 Year ICM trend:       [name] RN 08/19/2017 9:29 AM

## 2017-08-20 ENCOUNTER — Telehealth: Payer: Self-pay

## 2017-08-20 NOTE — Telephone Encounter (Signed)
Remote ICM transmission received.  Attempted call to patient and no answer   

## 2017-08-25 ENCOUNTER — Ambulatory Visit (HOSPITAL_COMMUNITY)
Admission: RE | Admit: 2017-08-25 | Discharge: 2017-08-25 | Disposition: A | Discharge status: Home / Self Care | Payer: Medicare HMO | Source: Ambulatory Visit | Attending: Internal Medicine | Admitting: Internal Medicine

## 2017-08-25 ENCOUNTER — Encounter (HOSPITAL_COMMUNITY): Payer: Self-pay

## 2017-08-25 VITALS — BP 148/82 | HR 75 | Wt 222.4 lb

## 2017-08-25 DIAGNOSIS — Z9981 Dependence on supplemental oxygen: Secondary | ICD-10-CM | POA: Diagnosis not present

## 2017-08-25 DIAGNOSIS — M109 Gout, unspecified: Secondary | ICD-10-CM | POA: Diagnosis not present

## 2017-08-25 DIAGNOSIS — Z95810 Presence of automatic (implantable) cardiac defibrillator: Secondary | ICD-10-CM | POA: Diagnosis not present

## 2017-08-25 DIAGNOSIS — Z853 Personal history of malignant neoplasm of breast: Secondary | ICD-10-CM | POA: Diagnosis not present

## 2017-08-25 DIAGNOSIS — I159 Secondary hypertension, unspecified: Secondary | ICD-10-CM

## 2017-08-25 DIAGNOSIS — I110 Hypertensive heart disease with heart failure: Secondary | ICD-10-CM | POA: Diagnosis not present

## 2017-08-25 DIAGNOSIS — E559 Vitamin D deficiency, unspecified: Secondary | ICD-10-CM | POA: Diagnosis not present

## 2017-08-25 DIAGNOSIS — I509 Heart failure, unspecified: Secondary | ICD-10-CM | POA: Diagnosis not present

## 2017-08-25 DIAGNOSIS — E785 Hyperlipidemia, unspecified: Secondary | ICD-10-CM | POA: Diagnosis not present

## 2017-08-25 DIAGNOSIS — J45909 Unspecified asthma, uncomplicated: Secondary | ICD-10-CM | POA: Insufficient documentation

## 2017-08-25 DIAGNOSIS — Z79899 Other long term (current) drug therapy: Secondary | ICD-10-CM | POA: Insufficient documentation

## 2017-08-25 DIAGNOSIS — Z6841 Body Mass Index (BMI) 40.0 and over, adult: Secondary | ICD-10-CM | POA: Diagnosis not present

## 2017-08-25 DIAGNOSIS — E1140 Type 2 diabetes mellitus with diabetic neuropathy, unspecified: Secondary | ICD-10-CM | POA: Diagnosis not present

## 2017-08-25 DIAGNOSIS — I447 Left bundle-branch block, unspecified: Secondary | ICD-10-CM

## 2017-08-25 DIAGNOSIS — K219 Gastro-esophageal reflux disease without esophagitis: Secondary | ICD-10-CM | POA: Diagnosis not present

## 2017-08-25 DIAGNOSIS — F329 Major depressive disorder, single episode, unspecified: Secondary | ICD-10-CM | POA: Diagnosis not present

## 2017-08-25 DIAGNOSIS — I428 Other cardiomyopathies: Secondary | ICD-10-CM | POA: Diagnosis not present

## 2017-08-25 DIAGNOSIS — I951 Orthostatic hypotension: Secondary | ICD-10-CM | POA: Diagnosis not present

## 2017-08-25 DIAGNOSIS — E039 Hypothyroidism, unspecified: Secondary | ICD-10-CM | POA: Diagnosis not present

## 2017-08-25 DIAGNOSIS — F419 Anxiety disorder, unspecified: Secondary | ICD-10-CM | POA: Insufficient documentation

## 2017-08-25 DIAGNOSIS — I5022 Chronic systolic (congestive) heart failure: Secondary | ICD-10-CM

## 2017-08-25 LAB — BASIC METABOLIC PANEL
Anion gap: 12 (ref 5–15)
BUN: 47 mg/dL — ABNORMAL HIGH (ref 6–20)
CO2: 33 mmol/L — ABNORMAL HIGH (ref 22–32)
Calcium: 9.3 mg/dL (ref 8.9–10.3)
Chloride: 92 mmol/L — ABNORMAL LOW (ref 101–111)
Creatinine, Ser: 1.52 mg/dL — ABNORMAL HIGH (ref 0.44–1.00)
GFR calc Af Amer: 41 mL/min — ABNORMAL LOW (ref >60)
GFR calc non Af Amer: 35 mL/min — ABNORMAL LOW (ref >60)
Glucose, Bld: 120 mg/dL — ABNORMAL HIGH (ref 65–99)
Potassium: 3.1 mmol/L — ABNORMAL LOW (ref 3.5–5.1)
Sodium: 137 mmol/L (ref 135–145)

## 2017-08-25 MED ORDER — CARVEDILOL 3.125 MG PO TABS: 3.1250 mg | ORAL_TABLET | Freq: Two times a day (BID) | ORAL | 3 refills | Status: DC

## 2017-08-25 MED ORDER — SPIRONOLACTONE 25 MG PO TABS: 12.5000 mg | ORAL_TABLET | Freq: Every day | ORAL | 3 refills | Status: DC

## 2017-08-25 NOTE — Progress Notes (Signed)
Patient ID: Rhonda [name], female   DOB: 1950/07/19, 67 y.o.   MRN: [id number]  Primary Cardiologist: Dr Radford Pax  General Surgeon: Dr Donne Hazel  Orthopedic: Dr Alvan Dame  PCP: Dr Justin Mend Referring MD: Dr Radford Pax  HPI: Rhonda [name] is a 67 y.o. 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 She has had sleep study with no  evidence of sleep apnea.  She has a Medtronic CRT-D device.  Last echo in 7/18 showed recovery of EF to 55-60%.   She presents today for follow up. Last visit torsemide decreased with orthostasis. Optivol showed increasing weight so 1 tab of torsemide added back to regimen. She has been feeling about the same. She has had no further falls, but remains lightheaded with standing the majority of the time. She denies CP or SOB. She continues to have peripheral edema. Using compression stockings daily. Taking allmedications as directed.   Optivol reviewed: Thoracic impedence trending towards dry. Recent Optivol crossing when torsemide decreased, but then corrected with increase. Pt activity ~1-2 hrs daily.   10/14/12 ECHO EF 35% RV ok  04/12/13 ECHO EF 20-25%, LV moderately dilated 10/11/2013 ECHO EF 45-50% 7/18 ECHO EF 55-60%, normal RV size and systolic function, PASP 34 mmHg  11/24/12 Creatinine 1.23 Potassium 4.1 12/15/12 Creatinine 2.03 Potassium 3.3  12/22/12 Creatinine 1.2 Potassium 3.8 02/23/13 Creatinine 1.68 K 4.4  05/02/13 K 4.2 Creatinine 1.8  10/11/13 K 3.7 Creatinine 1.69 10/17 K 4.1, creatinine 1.35  Review of systems complete and found to be negative unless listed in HPI.    Past Medical History:  Diagnosis Date  . Anemia   . Anxiety   . Arthritis    RIGHT KNEE ARTHRITIS AND PAIN-PT TOLD BONE ON BONE  . Asthma   . Back pain    DISK PROBLEM  . Breast cancer, stage 1 ([place]) 03/26/2011   left-FINISHED CHEMO  AND RADIATION  . Cardiomyopathy    [name]  . Chronic systolic heart failure (HCC)    a) NICM b) ECHO (03/2013) EF 20-25% c) ECHO (09/2013) EF 45-50%, grade I DD  . Complication of anesthesia    PT STATES HER B/P LOW AFTER ONE OF HER SURGERIES--SHE ATTRIBUTES TO LYING FLAT  . Depression   . Diabetes mellitus    "diet controlled" (05/04/2103)  . Exertional shortness of breath   . GERD (gastroesophageal reflux disease)   . Gout   . Heart murmur   . Hyperlipidemia   . Hypertension   . Hypothyroidism   . Left bundle branch block    s/p CRT-D (04/2013)  . Migraines   . On home oxygen therapy    "2L suppose to be q night" (05/03/2013)  . Peripheral neuropathy    feet  . SVD (spontaneous vaginal delivery)    x 2  . Unspecified vitamin D deficiency 03/26/2011   does not take meds    Current Outpatient Medications  Medication Sig Dispense Refill  . ARIPiprazole (ABILIFY) 2 MG tablet Take 2 mg by mouth every morning.     Marland Kitchen buPROPion (WELLBUTRIN XL) 150 MG 24 hr tablet Take 450 mg by mouth every morning.     . carvedilol (COREG) 6.25  MG tablet take 1 tablet by mouth every morning and 2 tablets by mouth every evening (Patient taking differently: Take 6.25-12.5 mg by mouth See admin instructions. take 1 tablet by mouth every morning and 2 tablets by mouth every evening) 90 tablet 3  . citalopram (CELEXA) 10 MG tablet Take 10 mg by mouth daily.    . clonazePAM (KLONOPIN) 0.5 MG tablet Take 0.5 mg by mouth 2 (two) times daily.     . febuxostat (ULORIC) 40 MG tablet Take 40 mg by mouth at bedtime.     . gabapentin (NEURONTIN) 300 MG capsule Take 300 mg by mouth 3 (three) times daily.     Marland Kitchen levothyroxine (SYNTHROID, LEVOTHROID) 125 MCG tablet Take 125 mcg by mouth daily before breakfast. On an empty stomach     . metolazone (ZAROXOLYN) 2.5 MG tablet take 1 tablet by mouth if needed for FLUID OR EDEMA 30 tablet 2  . potassium chloride SA (K-DUR,KLOR-CON) 20 MEQ tablet Take 1 tablet (20 mEq total) by mouth daily. 90 tablet 3  . simvastatin (ZOCOR) 10 MG tablet Take 10 mg by mouth every evening.     Marland Kitchen spironolactone (ALDACTONE) 25 MG tablet Take 1 tablet (25 mg total) by mouth daily. 30 tablet 3  . torsemide (DEMADEX) 20 MG tablet Take 2 tablets (40 mg) by mouth every morning and 1 tablet (20 mg) by mouth every afternoon 270 tablet 3  . zolpidem (AMBIEN) 10 MG tablet Take 10 mg by mouth at bedtime as needed. For sleep    . montelukast (SINGULAIR) 10 MG tablet Take 10 mg by mouth at bedtime.   1   Current Facility-Administered Medications  Medication Dose Route Frequency Provider Last Rate Last Dose  . triamcinolone acetonide (KENALOG-40) injection 20 mg  20 mg Other Once Landis Martins, DPM        Vitals:   08/25/17 1331  BP: (!) 148/82  Pulse: 75  SpO2: 99%  Weight: 222 lb 6.4 oz (100.9 kg)   Orthostatics - Sitting 146/86 - Standing 120/66  Wt Readings from Last 3 Encounters:  08/25/17 222 lb 6.4 oz (100.9 kg)  07/28/17 224 lb 12.8 oz (102 kg)  07/23/17 228 lb 2 oz (103.5 kg)    PHYSICAL EXAM: General: Fatigued. NAD.  HEENT: Normal Neck: Supple. JVP difficult. Does not appear elevated. Carotids 2+ bilat; no bruits. No thyromegaly or nodule noted. Cor: PMI nondisplaced. RRR, No M/G/R noted Lungs: CTAB, normal effort. Abdomen: Soft, non-tender, non-distended, no HSM. No bruits or masses. +BS  Extremities: No cyanosis, clubbing, or rash. 1-2+ chronic edema.   Neuro: Alert & orientedx3, cranial nerves grossly intact. moves all 4 extremities w/o difficulty. Affect pleasant   ASSESSMENT & PLAN:  1) Chronic systolic HF: NICM s/p Medtronic CRT-D, ?Viral myocarditis. Cardiomyopathy dates from 39. EF 45-50% (09/2013), EF up to 55-60% on 7/18 echo.  - NYHA class II.  - She remains  orthostatic on exam - Volume status looks OK, though with continue peripheral edema.  - She has worsening orthostasis with increase diuretics, and worsening volume overload if we decrease further.  - Continue torsemide 40 mg q am and 20 mg q pm. BMET today. She has a very narrow euvolemic window.  - Decrease coreg 3.125 mg BID - Decrease spiro 12.5 mg daily.  - With NICM, orthostasis, and IVCD (LBBB) will send PYP scan and myeloma panel with concern for infiltrative cardiomyopathy. - Reinforced fluid restriction to < 2 L daily, sodium restriction to less than 2000  mg daily, and the importance of daily weights.   2) Obesity - Body mass index is 42.02 kg/m.  - Encouraged weight loss.  3) HTN - Meds as above.    4) Orthostatic Hypotension - She continues to have lightheadedness. Volume status stable on exam.  - Neurology has suggested midodrine. While neurogenic orthostasis is a definite possibility, Also concerning for infiltrative cardiomyopathy. Will work up as above.  - Will forward to EP office to inquire if ICD is MRI compatible for further Neuro work up at Dr. Serita Grit request.  Long discussion today about the possibility of infiltrative cardiomyopathy. Meds as above. Labs today. RTC 4 weeks.   Shirley Friar, PA-C  08/25/2017  Greater than 50% of the 25 minute visit was spent in counseling/coordination of care regarding disease state education, salt/fluid restriction, sliding scale diuretics, and medication compliance.

## 2017-08-25 NOTE — Patient Instructions (Signed)
Labs today (will call for abnormal results, otherwise no news is good news)  DECREASE Spironolactone to 12.5 mg (0.5 Tablet) Once Daily  DECREASE Coreg to 3.125 mg Twice Daily  PYP test has been ordered for you.  Follow up with Barrington Ellison, PA in 4 weeks.  Follow up with Dr. Aundra Dubin in 3 Months

## 2017-08-27 ENCOUNTER — Telehealth (HOSPITAL_COMMUNITY): Payer: Self-pay | Admitting: *Deleted

## 2017-08-27 DIAGNOSIS — I5022 Chronic systolic (congestive) heart failure: Secondary | ICD-10-CM

## 2017-08-27 MED ORDER — POTASSIUM CHLORIDE CRYS ER 20 MEQ PO TBCR: 20.0000 meq | EXTENDED_RELEASE_TABLET | Freq: Two times a day (BID) | ORAL | 3 refills | Status: DC

## 2017-08-27 NOTE — Telephone Encounter (Signed)
Result Notes for Basic Metabolic Panel (BMET)   Notes recorded by Darron Doom, RN on 08/27/2017 at 8:32 AM EDT Called patient again and husband picked up. He is agreeable with plan. He will have her take 60 meq of K today and will increase it to 20 mEq BID daily. Lab appointment scheduled, bmet ordered, and MAR updated. ------  Notes recorded by Kerry Dory, CMA on 08/27/2017 at 8:16 AM EDT Left message for patient to call back.[phone] (H)  ------  Notes recorded by Shirley Friar, PA-C on 08/27/2017 at 7:50 AM EDT Please call today.    Legrand Como [address]" [place], PA-C 08/27/2017 7:50 AM ------  Notes recorded by Shirley Friar, PA-C on 08/26/2017 at 1:14 PM EDT Please call today. ------  Notes recorded by Shirley Friar, PA-C on 08/25/2017 at 3:18 PM EDT Please have her take extra 60 meq of K today, then increase to 20 meq BID. Needs repeat 2 weeks.    Thank you.    Legrand Como [address]" [place], [place] 08/25/2017 3:18 PM

## 2017-09-02 ENCOUNTER — Encounter (HOSPITAL_COMMUNITY)
Admission: RE | Admit: 2017-09-02 | Discharge: 2017-09-02 | Disposition: A | Discharge status: Home / Self Care | Payer: Medicare HMO | Source: Ambulatory Visit | Attending: Student | Admitting: Student

## 2017-09-02 ENCOUNTER — Encounter (HOSPITAL_COMMUNITY): Payer: Medicare HMO

## 2017-09-02 DIAGNOSIS — I509 Heart failure, unspecified: Secondary | ICD-10-CM | POA: Diagnosis not present

## 2017-09-02 DIAGNOSIS — I5022 Chronic systolic (congestive) heart failure: Secondary | ICD-10-CM | POA: Diagnosis not present

## 2017-09-02 MED ORDER — TECHNETIUM TC 99M PYROPHOSPHATE
20.8000 | Freq: Once | INTRAVENOUS | Status: AC
  Administered 2017-09-02: 20.8 via INTRAVENOUS
  Filled 2017-09-02: qty 21

## 2017-09-09 ENCOUNTER — Ambulatory Visit (HOSPITAL_COMMUNITY)
Admission: RE | Admit: 2017-09-09 | Discharge: 2017-09-09 | Disposition: A | Discharge status: Home / Self Care | Payer: Medicare HMO | Source: Ambulatory Visit | Attending: Internal Medicine | Admitting: Internal Medicine

## 2017-09-09 DIAGNOSIS — I5022 Chronic systolic (congestive) heart failure: Secondary | ICD-10-CM | POA: Insufficient documentation

## 2017-09-09 LAB — BASIC METABOLIC PANEL
Anion gap: 10 (ref 5–15)
BUN: 20 mg/dL (ref 6–20)
CO2: 30 mmol/L (ref 22–32)
Calcium: 8.6 mg/dL — ABNORMAL LOW (ref 8.9–10.3)
Chloride: 100 mmol/L — ABNORMAL LOW (ref 101–111)
Creatinine, Ser: 1.22 mg/dL — ABNORMAL HIGH (ref 0.44–1.00)
GFR calc Af Amer: 53 mL/min — ABNORMAL LOW (ref >60)
GFR calc non Af Amer: 46 mL/min — ABNORMAL LOW (ref >60)
Glucose, Bld: 104 mg/dL — ABNORMAL HIGH (ref 65–99)
Potassium: 3.4 mmol/L — ABNORMAL LOW (ref 3.5–5.1)
Sodium: 140 mmol/L (ref 135–145)

## 2017-09-09 NOTE — Addendum Note (Signed)
Encounter addended by: Shirley Friar, PA-C on: 09/09/2017 7:35 AM  Actions taken: LOS modified

## 2017-09-13 ENCOUNTER — Telehealth (HOSPITAL_COMMUNITY): Payer: Self-pay | Admitting: Cardiology

## 2017-09-13 NOTE — Telephone Encounter (Signed)
-----   Message from Shirley Friar, PA-C sent at 09/09/2017 10:47 AM EDT ----- Increase K to 40 meq q am and 20 meq q pm.    Beryle Beams" [place], PA-C 09/09/2017 10:47 AM

## 2017-09-13 NOTE — Telephone Encounter (Signed)
Notes recorded by Kerry Dory, CMA on 09/13/2017 at 4:42 PM EDT Patient aware.  ------  Notes recorded by Kerry Dory, CMA on 09/13/2017 at 10:00 AM EDT Left message for patient to call back. [phone] (H) ------  Notes recorded by Kerry Dory, CMA on 09/10/2017 at 11:31 AM EDT Unable to reach patient. [phone] (H)  ------  Notes recorded by Shirley Friar, PA-C on 09/09/2017 at 10:47 AM EDT Increase K to 40 meq q am and 20 meq q pm.    Beryle Beams" [place], PA-C 09/09/2017 10:47 AM

## 2017-09-14 ENCOUNTER — Telehealth: Payer: Self-pay | Admitting: Internal Medicine

## 2017-09-14 MED ORDER — POTASSIUM CHLORIDE CRYS ER 20 MEQ PO TBCR: EXTENDED_RELEASE_TABLET | ORAL | 3 refills | Status: DC

## 2017-09-14 NOTE — Telephone Encounter (Signed)
Patient returned call with concerns regarding increased BLE.  patient reports she has increased weight at 231, mild SOB, unable to wear compression stocking today as edema is so bad. Reports when she presses on legs she can see an indention and legs appear "scaly"  Advised to take metolazone today and return call 4/24 for further instructions. Voiced understanding

## 2017-09-14 NOTE — Addendum Note (Signed)
Addended by: Kerry Dory on: 09/14/2017 03:07 PM   Modules accepted: Orders

## 2017-09-14 NOTE — Telephone Encounter (Signed)
New Message:      Pt states [facility] is waiting on dr to send over a form regarding the pt's number on her cardiomegaly. They are needing an order form. They are needing to do an MRI of the brain and the spine.Pt spoke with [facility] and there fax number [phone]. Dr. [name]: Neurologist

## 2017-09-16 DIAGNOSIS — F3289 Other specified depressive episodes: Secondary | ICD-10-CM | POA: Diagnosis not present

## 2017-09-17 ENCOUNTER — Other Ambulatory Visit: Payer: Self-pay | Admitting: Internal Medicine

## 2017-09-17 NOTE — Telephone Encounter (Signed)
Spoke w/ Rhonda [name] and she stated that she needed a form to be filled out to determine if she could have a MRI. Rhonda [name] gave me the number to the [facility] and I called them to determine if they needed [place] to fill out a form informing them that Rhonda [name] device is not MRI compatible. Scheduler informed me that the supervisor with the radiology dept already knew the device was not MRI compatible.

## 2017-09-20 ENCOUNTER — Telehealth: Payer: Self-pay

## 2017-09-20 ENCOUNTER — Ambulatory Visit (INDEPENDENT_AMBULATORY_CARE_PROVIDER_SITE_OTHER): Payer: Medicare HMO

## 2017-09-20 DIAGNOSIS — I5022 Chronic systolic (congestive) heart failure: Secondary | ICD-10-CM | POA: Diagnosis not present

## 2017-09-20 DIAGNOSIS — Z95810 Presence of automatic (implantable) cardiac defibrillator: Secondary | ICD-10-CM

## 2017-09-20 NOTE — Progress Notes (Signed)
EPIC Encounter for ICM Monitoring  Patient Name: Rhonda [name] is a 67 y.o. female Date: 09/20/2017 Primary Care Physican: Maurice Small, MD Primary [place] Electrophysiologist: Lovena Le Dry Weight: Previous ICM 238 lbs   Bi-V Pacing: 100%       Attempted call to patient and unable to reach.   Transmission reviewed.    Thoracic impedance abnormal suggesting fluid accumulation since 08/29/2017.  Prescribed dosage: Torsemide 20 mg 2 tablets (40 mg total) every morning and 1 tablet (20 mg total) every afternoon.Potassium 20 meq 2tablets every morning and 1 tablet (20 mEq total) every afternoon. Metolazone 2.55m 1 tablet by mouth if needed for fluid or edema.  Labs: 07/28/2017 Creatinine 1.69, BUN 53, Potassium 3.0, Sodium 139, EGFR 31-36 (low potassium addressed by HF clinic) 06/21/2017 Creatinine 2.06, BUN 38, Potassium 2.5, Sodium 136, EGFR 24-28 drawn in ER and was given 80 mEq of Potassium during ER visit  Recommendations: NONE - Unable to reach.  Follow-up plan: ICM clinic phone appointment on 10/07/2017.  Office appointment scheduled 09/23/2017 with HF clinic NP/PA.  Copy of ICM check sent to Dr. TLovena Le   3 month ICM trend: 09/20/2017    1 Year ICM trend:       [name] RN 09/20/2017 3:52 PM

## 2017-09-20 NOTE — Telephone Encounter (Signed)
Remote ICM transmission received.  Attempted call to patient and no answer   

## 2017-09-22 NOTE — Progress Notes (Signed)
Patient ID: Rhonda [name], female   DOB: 1951-01-30, 66 y.o.   MRN: [id number]  Primary Cardiologist: Dr Radford Pax  General Surgeon: Dr Donne Hazel  Orthopedic: Dr Alvan Dame  PCP: Dr Justin Mend Referring MD: Dr Radford Pax  HPI: Rhonda [name] is a 67 y.o. 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 She has had sleep study with no  evidence of sleep apnea.  She has a Medtronic CRT-D device.  Last echo in 7/18 showed recovery of EF to 55-60%.   She presents today for follow up. Last visit spiro and coreg cut back.  PYP negative and SPEP negative. She is up 10 lbs from last visit. Took metolazone last week with some relief.  She continues to have trouble with her dentures and pain s/p recent extraction. She is eating mostly watered down soup. Her lightheadedness has improved with weight gain/volume overload.  She cannot quite walk a block without stopping to rest. She denies SOB with getting dressed or bathing. Taking all medications as direction.   Optivol: Not available.   10/14/12 ECHO EF 35% RV ok  04/12/13 ECHO EF 20-25%, LV moderately dilated 10/11/2013 ECHO EF 45-50% 7/18 ECHO EF 55-60%, normal RV size and systolic function, PASP 34 mmHg  11/24/12 Creatinine 1.23 Potassium 4.1 12/15/12 Creatinine 2.03 Potassium 3.3  12/22/12 Creatinine 1.2 Potassium 3.8 02/23/13 Creatinine 1.68 K 4.4  05/02/13 K 4.2 Creatinine 1.8  10/11/13 K 3.7 Creatinine 1.69 10/17 K 4.1, creatinine 1.35  Review of systems complete and found to be negative unless listed in HPI.    Past Medical History:  Diagnosis Date  . Anemia   . Anxiety   . Arthritis    RIGHT KNEE ARTHRITIS AND PAIN-PT TOLD BONE ON BONE  .  Asthma   . Back pain    DISK PROBLEM  . Breast cancer, stage 1 ([place]) 03/26/2011   left-FINISHED CHEMO  AND RADIATION  . Cardiomyopathy    [name]  . Chronic systolic heart failure (HCC)    a) NICM b) ECHO (03/2013) EF 20-25% c) ECHO (09/2013) EF 45-50%, grade I DD  . Complication of anesthesia    PT STATES HER B/P LOW AFTER ONE OF HER SURGERIES--SHE ATTRIBUTES TO LYING FLAT  . Depression   . Diabetes mellitus    "diet controlled" (05/04/2103)  . Exertional shortness of breath   . GERD (gastroesophageal reflux disease)   . Gout   . Heart murmur   . Hyperlipidemia   . Hypertension   . Hypothyroidism   . Left bundle branch block    s/p CRT-D (04/2013)  . Migraines   . On home oxygen therapy    "2L suppose to be q night" (05/03/2013)  . Peripheral neuropathy    feet  . SVD (spontaneous vaginal delivery)    x 2  . Unspecified vitamin D deficiency 03/26/2011   does not take meds    Current Outpatient Medications  Medication Sig Dispense Refill  . ARIPiprazole (ABILIFY) 2 MG tablet Take 2 mg by mouth every morning.     Marland Kitchen buPROPion (WELLBUTRIN XL) 150 MG 24 hr tablet Take 450 mg by mouth every morning.     . carvedilol (COREG)  3.125 MG tablet Take 1 tablet (3.125 mg total) by mouth 2 (two) times daily with a meal. 60 tablet 3  . citalopram (CELEXA) 10 MG tablet Take 10 mg by mouth daily.    . clonazePAM (KLONOPIN) 0.5 MG tablet Take 0.5 mg by mouth 2 (two) times daily.     . febuxostat (ULORIC) 40 MG tablet Take 40 mg by mouth at bedtime.     . gabapentin (NEURONTIN) 300 MG capsule Take 300 mg by mouth 3 (three) times daily.     Marland Kitchen levothyroxine (SYNTHROID, LEVOTHROID) 125 MCG tablet Take 125 mcg by mouth daily before breakfast. On an empty stomach    . metolazone (ZAROXOLYN) 2.5 MG tablet take 1 tablet by mouth if needed for FLUID OR EDEMA 30 tablet 2  . montelukast (SINGULAIR) 10 MG tablet Take 10 mg by mouth at bedtime.   1  . potassium chloride SA  (K-DUR,KLOR-CON) 20 MEQ tablet Take 2 tablets (40 mEq total) by mouth every morning AND 1 tablet (20 mEq total) every evening. 90 tablet 3  . simvastatin (ZOCOR) 10 MG tablet Take 10 mg by mouth every evening.     Marland Kitchen spironolactone (ALDACTONE) 25 MG tablet Take 0.5 tablets (12.5 mg total) by mouth daily. 45 tablet 3  . torsemide (DEMADEX) 20 MG tablet Take 2 tablets (40 mg) by mouth every morning and 1 tablet (20 mg) by mouth every afternoon 270 tablet 3  . zolpidem (AMBIEN) 10 MG tablet Take 10 mg by mouth at bedtime as needed. For sleep     Current Facility-Administered Medications  Medication Dose Route Frequency Provider Last Rate Last Dose  . triamcinolone acetonide (KENALOG-40) injection 20 mg  20 mg Other Once Landis Martins, DPM        Vitals:   09/23/17 1340  BP: (!) 144/80  Pulse: 75  SpO2: 100%  Weight: 232 lb 9.6 oz (105.5 kg)    Wt Readings from Last 3 Encounters:  09/23/17 232 lb 9.6 oz (105.5 kg)  08/25/17 222 lb 6.4 oz (100.9 kg)  07/28/17 224 lb 12.8 oz (102 kg)    PHYSICAL EXAM: General: Well appearing. No resp difficulty. HEENT: Normal Neck: Supple. JVP 9-10 cm. Carotids 2+ bilat; no bruits. No thyromegaly or nodule noted. Cor: PMI nondisplaced. RRR, No M/G/R noted Lungs: CTAB, normal effort. Abdomen: Soft, non-tender, non-distended, no HSM. No bruits or masses. +BS  Extremities: No cyanosis, clubbing, or rash. 2-3+ BLE edema.  Neuro: Alert & orientedx3, cranial nerves grossly intact. moves all 4 extremities w/o difficulty. Affect pleasant   ASSESSMENT & PLAN:  1) Chronic systolic HF: NICM s/p Medtronic CRT-D, ?Viral myocarditis. Cardiomyopathy dates from 48. EF 45-50% (09/2013), EF up to 55-60% on 7/18 echo.  - NYHA III - Volume status elevated on exam. She has a very narrow window and is labile between orthostasis and overload.  - Take torsemide 40 mg BID x 2 days, with metolazone tomorrow. Take an extra 40 meq of K. BMET today.  - After 2 days, go back  to torsemide 40 mg q am and 20 mg q pm. Should take extra in the evening as needed.  - Continue coreg 3.125 mg BID. Has not tolerated higher doses with orthostasis.  - Continue spiro 12.5 mg daily.  - PYP 09/02/17 negative for TTR (Grade 0-1, H/CCL 1.2). SPEP with no M-spike.  - Reinforced fluid restriction to < 2 L daily, sodium restriction to less than 2000 mg daily, and the importance of daily weights.  2) Obesity - Body mass index is 43.95 kg/m.  - Encouraged weight loss.  3) HTN - Meds as above. No change with diuresis.  4) Orthostatic Hypotension - Plan as above.  - Neurology has suggested midodrine. May end up needing to tolerate diuresis.  5) Chronic Venous stasis - Pt is homebound, has been told not to drive by neurology.   - Will ask [facility] to see and place UNNA boots.   Volume overloaded on exam. Meds and plans as above. RTC 4 weeks, sooner with symptoms.   Shirley Friar, PA-C  09/23/2017  Greater than 50% of the 25 minute visit was spent in counseling/coordination of care regarding disease state education, salt/fluid restriction, sliding scale diuretics, and medication compliance.

## 2017-09-23 ENCOUNTER — Ambulatory Visit (HOSPITAL_COMMUNITY)
Admission: RE | Admit: 2017-09-23 | Discharge: 2017-09-23 | Disposition: A | Discharge status: Home / Self Care | Payer: Medicare HMO | Source: Ambulatory Visit | Attending: Internal Medicine | Admitting: Internal Medicine

## 2017-09-23 ENCOUNTER — Encounter (HOSPITAL_COMMUNITY): Payer: Self-pay

## 2017-09-23 VITALS — BP 144/80 | HR 75 | Wt 232.6 lb

## 2017-09-23 DIAGNOSIS — F419 Anxiety disorder, unspecified: Secondary | ICD-10-CM | POA: Insufficient documentation

## 2017-09-23 DIAGNOSIS — I447 Left bundle-branch block, unspecified: Secondary | ICD-10-CM | POA: Insufficient documentation

## 2017-09-23 DIAGNOSIS — J45909 Unspecified asthma, uncomplicated: Secondary | ICD-10-CM | POA: Insufficient documentation

## 2017-09-23 DIAGNOSIS — M109 Gout, unspecified: Secondary | ICD-10-CM | POA: Insufficient documentation

## 2017-09-23 DIAGNOSIS — Z853 Personal history of malignant neoplasm of breast: Secondary | ICD-10-CM | POA: Diagnosis not present

## 2017-09-23 DIAGNOSIS — E039 Hypothyroidism, unspecified: Secondary | ICD-10-CM | POA: Insufficient documentation

## 2017-09-23 DIAGNOSIS — E1140 Type 2 diabetes mellitus with diabetic neuropathy, unspecified: Secondary | ICD-10-CM | POA: Insufficient documentation

## 2017-09-23 DIAGNOSIS — Z9981 Dependence on supplemental oxygen: Secondary | ICD-10-CM | POA: Diagnosis not present

## 2017-09-23 DIAGNOSIS — C50919 Malignant neoplasm of unspecified site of unspecified female breast: Secondary | ICD-10-CM | POA: Diagnosis not present

## 2017-09-23 DIAGNOSIS — I429 Cardiomyopathy, unspecified: Secondary | ICD-10-CM | POA: Insufficient documentation

## 2017-09-23 DIAGNOSIS — I159 Secondary hypertension, unspecified: Secondary | ICD-10-CM

## 2017-09-23 DIAGNOSIS — E785 Hyperlipidemia, unspecified: Secondary | ICD-10-CM | POA: Diagnosis not present

## 2017-09-23 DIAGNOSIS — Z79899 Other long term (current) drug therapy: Secondary | ICD-10-CM | POA: Diagnosis not present

## 2017-09-23 DIAGNOSIS — K219 Gastro-esophageal reflux disease without esophagitis: Secondary | ICD-10-CM | POA: Diagnosis not present

## 2017-09-23 DIAGNOSIS — I110 Hypertensive heart disease with heart failure: Secondary | ICD-10-CM | POA: Insufficient documentation

## 2017-09-23 DIAGNOSIS — E559 Vitamin D deficiency, unspecified: Secondary | ICD-10-CM | POA: Insufficient documentation

## 2017-09-23 DIAGNOSIS — J9610 Chronic respiratory failure, unspecified whether with hypoxia or hypercapnia: Secondary | ICD-10-CM | POA: Diagnosis not present

## 2017-09-23 DIAGNOSIS — Z9221 Personal history of antineoplastic chemotherapy: Secondary | ICD-10-CM | POA: Diagnosis not present

## 2017-09-23 DIAGNOSIS — I951 Orthostatic hypotension: Secondary | ICD-10-CM | POA: Diagnosis not present

## 2017-09-23 DIAGNOSIS — F329 Major depressive disorder, single episode, unspecified: Secondary | ICD-10-CM | POA: Insufficient documentation

## 2017-09-23 DIAGNOSIS — I5022 Chronic systolic (congestive) heart failure: Secondary | ICD-10-CM

## 2017-09-23 LAB — BASIC METABOLIC PANEL
Anion gap: 8 (ref 5–15)
BUN: 29 mg/dL — ABNORMAL HIGH (ref 6–20)
CO2: 37 mmol/L — ABNORMAL HIGH (ref 22–32)
Calcium: 9.2 mg/dL (ref 8.9–10.3)
Chloride: 97 mmol/L — ABNORMAL LOW (ref 101–111)
Creatinine, Ser: 1.16 mg/dL — ABNORMAL HIGH (ref 0.44–1.00)
GFR calc Af Amer: 56 mL/min — ABNORMAL LOW (ref >60)
GFR calc non Af Amer: 49 mL/min — ABNORMAL LOW (ref >60)
Glucose, Bld: 136 mg/dL — ABNORMAL HIGH (ref 65–99)
Potassium: 3.2 mmol/L — ABNORMAL LOW (ref 3.5–5.1)
Sodium: 142 mmol/L (ref 135–145)

## 2017-09-23 NOTE — Patient Instructions (Signed)
Labs today (will call for abnormal results, otherwise no news is good news)  INCREASE Torsemide to 40 mg (2 Tabs) Twice Daily for Two Days only, then resume previous dose of 40 mg in the AM and 20 mg in the PM.  TAKE Metolazone tomorrow morning along with 2 addition potassium.  Take 30 minutes prior to morning torsemide dose.   Referral ordered for Home Health RN, they will contact you to schedule initial visit.  Follow up in clinic in 4 weeks.  Follow up with Dr. Haroldine Laws in 3-4 months.

## 2017-09-24 ENCOUNTER — Telehealth (HOSPITAL_COMMUNITY): Payer: Self-pay | Admitting: *Deleted

## 2017-09-24 DIAGNOSIS — F329 Major depressive disorder, single episode, unspecified: Secondary | ICD-10-CM | POA: Diagnosis not present

## 2017-09-24 DIAGNOSIS — K219 Gastro-esophageal reflux disease without esophagitis: Secondary | ICD-10-CM | POA: Diagnosis not present

## 2017-09-24 DIAGNOSIS — I5022 Chronic systolic (congestive) heart failure: Secondary | ICD-10-CM | POA: Diagnosis not present

## 2017-09-24 DIAGNOSIS — E039 Hypothyroidism, unspecified: Secondary | ICD-10-CM | POA: Diagnosis not present

## 2017-09-24 DIAGNOSIS — I429 Cardiomyopathy, unspecified: Secondary | ICD-10-CM | POA: Diagnosis not present

## 2017-09-24 DIAGNOSIS — I110 Hypertensive heart disease with heart failure: Secondary | ICD-10-CM | POA: Diagnosis not present

## 2017-09-24 DIAGNOSIS — E1142 Type 2 diabetes mellitus with diabetic polyneuropathy: Secondary | ICD-10-CM | POA: Diagnosis not present

## 2017-09-24 DIAGNOSIS — D649 Anemia, unspecified: Secondary | ICD-10-CM | POA: Diagnosis not present

## 2017-09-24 DIAGNOSIS — E785 Hyperlipidemia, unspecified: Secondary | ICD-10-CM | POA: Diagnosis not present

## 2017-09-24 MED ORDER — POTASSIUM CHLORIDE CRYS ER 20 MEQ PO TBCR: 40.0000 meq | EXTENDED_RELEASE_TABLET | Freq: Two times a day (BID) | ORAL | 3 refills | Status: DC

## 2017-09-24 NOTE — Telephone Encounter (Signed)
-----   Message from Shirley Friar, PA-C sent at 09/23/2017  3:24 PM EDT ----- Please take extra 40 meq TODAY, extra 40 meq with metolazone as ordered, and increase dose to 40 meq BID chronically.    Legrand Como [address] [place], PA-C 09/23/2017 3:22 PM

## 2017-09-24 NOTE — Telephone Encounter (Signed)
Notes recorded by Scarlette Calico, RN on 09/24/2017 at 10:14 AM EDT Pt aware, agreeable and verbalizes understanding, new rx sent in

## 2017-09-28 DIAGNOSIS — E785 Hyperlipidemia, unspecified: Secondary | ICD-10-CM | POA: Diagnosis not present

## 2017-09-28 DIAGNOSIS — K219 Gastro-esophageal reflux disease without esophagitis: Secondary | ICD-10-CM | POA: Diagnosis not present

## 2017-09-28 DIAGNOSIS — I5022 Chronic systolic (congestive) heart failure: Secondary | ICD-10-CM | POA: Diagnosis not present

## 2017-09-28 DIAGNOSIS — I429 Cardiomyopathy, unspecified: Secondary | ICD-10-CM | POA: Diagnosis not present

## 2017-09-28 DIAGNOSIS — E1142 Type 2 diabetes mellitus with diabetic polyneuropathy: Secondary | ICD-10-CM | POA: Diagnosis not present

## 2017-09-28 DIAGNOSIS — I110 Hypertensive heart disease with heart failure: Secondary | ICD-10-CM | POA: Diagnosis not present

## 2017-09-28 DIAGNOSIS — E039 Hypothyroidism, unspecified: Secondary | ICD-10-CM | POA: Diagnosis not present

## 2017-09-28 DIAGNOSIS — F329 Major depressive disorder, single episode, unspecified: Secondary | ICD-10-CM | POA: Diagnosis not present

## 2017-09-28 DIAGNOSIS — D649 Anemia, unspecified: Secondary | ICD-10-CM | POA: Diagnosis not present

## 2017-10-01 ENCOUNTER — Telehealth (HOSPITAL_COMMUNITY): Payer: Self-pay | Admitting: *Deleted

## 2017-10-01 DIAGNOSIS — I5022 Chronic systolic (congestive) heart failure: Secondary | ICD-10-CM | POA: Diagnosis not present

## 2017-10-01 DIAGNOSIS — I429 Cardiomyopathy, unspecified: Secondary | ICD-10-CM | POA: Diagnosis not present

## 2017-10-01 DIAGNOSIS — E1142 Type 2 diabetes mellitus with diabetic polyneuropathy: Secondary | ICD-10-CM | POA: Diagnosis not present

## 2017-10-01 DIAGNOSIS — E785 Hyperlipidemia, unspecified: Secondary | ICD-10-CM | POA: Diagnosis not present

## 2017-10-01 DIAGNOSIS — I110 Hypertensive heart disease with heart failure: Secondary | ICD-10-CM | POA: Diagnosis not present

## 2017-10-01 DIAGNOSIS — D649 Anemia, unspecified: Secondary | ICD-10-CM | POA: Diagnosis not present

## 2017-10-01 DIAGNOSIS — F329 Major depressive disorder, single episode, unspecified: Secondary | ICD-10-CM | POA: Diagnosis not present

## 2017-10-01 DIAGNOSIS — E039 Hypothyroidism, unspecified: Secondary | ICD-10-CM | POA: Diagnosis not present

## 2017-10-01 DIAGNOSIS — K219 Gastro-esophageal reflux disease without esophagitis: Secondary | ICD-10-CM | POA: Diagnosis not present

## 2017-10-01 NOTE — Telephone Encounter (Signed)
Advanced Heart Failure Triage Encounter  Patient Name: Rhonda [name]  Date of Call: 10/01/17  Problem:  Patient called in tears complaining of her neuropathy pain stating her medication is not working and she I scared for the nurse to come and wrap her legs with unna boots.  I told her that its fine to skip the unna boots today but that she needed to call Dr. Justin Mend regarding her medication not working.  She was very tearful and asked if I could call Dr. Justin Mend, with it being Friday she was scared she wouldn't get a call back before the weekend.   Plan:  I have called and left a message for Dr. Justin Mend regarding patient's medication issue and pain.  I also asked patient to call and speak to Dr. Jason Nest nurse.  I asked her to call us back next week if she continues to have problems. She was very thankful and said she would call back next week.   Darron Doom, RN

## 2017-10-02 DIAGNOSIS — D649 Anemia, unspecified: Secondary | ICD-10-CM | POA: Diagnosis not present

## 2017-10-02 DIAGNOSIS — F329 Major depressive disorder, single episode, unspecified: Secondary | ICD-10-CM | POA: Diagnosis not present

## 2017-10-02 DIAGNOSIS — I429 Cardiomyopathy, unspecified: Secondary | ICD-10-CM | POA: Diagnosis not present

## 2017-10-02 DIAGNOSIS — E1142 Type 2 diabetes mellitus with diabetic polyneuropathy: Secondary | ICD-10-CM | POA: Diagnosis not present

## 2017-10-02 DIAGNOSIS — E039 Hypothyroidism, unspecified: Secondary | ICD-10-CM | POA: Diagnosis not present

## 2017-10-02 DIAGNOSIS — K219 Gastro-esophageal reflux disease without esophagitis: Secondary | ICD-10-CM | POA: Diagnosis not present

## 2017-10-02 DIAGNOSIS — I110 Hypertensive heart disease with heart failure: Secondary | ICD-10-CM | POA: Diagnosis not present

## 2017-10-02 DIAGNOSIS — I5022 Chronic systolic (congestive) heart failure: Secondary | ICD-10-CM | POA: Diagnosis not present

## 2017-10-02 DIAGNOSIS — E785 Hyperlipidemia, unspecified: Secondary | ICD-10-CM | POA: Diagnosis not present

## 2017-10-05 DIAGNOSIS — K219 Gastro-esophageal reflux disease without esophagitis: Secondary | ICD-10-CM | POA: Diagnosis not present

## 2017-10-05 DIAGNOSIS — E785 Hyperlipidemia, unspecified: Secondary | ICD-10-CM | POA: Diagnosis not present

## 2017-10-05 DIAGNOSIS — D649 Anemia, unspecified: Secondary | ICD-10-CM | POA: Diagnosis not present

## 2017-10-05 DIAGNOSIS — F329 Major depressive disorder, single episode, unspecified: Secondary | ICD-10-CM | POA: Diagnosis not present

## 2017-10-05 DIAGNOSIS — E039 Hypothyroidism, unspecified: Secondary | ICD-10-CM | POA: Diagnosis not present

## 2017-10-05 DIAGNOSIS — I5022 Chronic systolic (congestive) heart failure: Secondary | ICD-10-CM | POA: Diagnosis not present

## 2017-10-05 DIAGNOSIS — I110 Hypertensive heart disease with heart failure: Secondary | ICD-10-CM | POA: Diagnosis not present

## 2017-10-05 DIAGNOSIS — E1142 Type 2 diabetes mellitus with diabetic polyneuropathy: Secondary | ICD-10-CM | POA: Diagnosis not present

## 2017-10-05 DIAGNOSIS — I429 Cardiomyopathy, unspecified: Secondary | ICD-10-CM | POA: Diagnosis not present

## 2017-10-07 ENCOUNTER — Ambulatory Visit (INDEPENDENT_AMBULATORY_CARE_PROVIDER_SITE_OTHER): Payer: Medicare HMO | Admitting: *Deleted

## 2017-10-07 DIAGNOSIS — I509 Heart failure, unspecified: Secondary | ICD-10-CM | POA: Diagnosis not present

## 2017-10-07 DIAGNOSIS — Z95810 Presence of automatic (implantable) cardiac defibrillator: Secondary | ICD-10-CM

## 2017-10-07 DIAGNOSIS — F3289 Other specified depressive episodes: Secondary | ICD-10-CM | POA: Diagnosis not present

## 2017-10-07 DIAGNOSIS — I428 Other cardiomyopathies: Secondary | ICD-10-CM

## 2017-10-07 NOTE — Progress Notes (Signed)
Remote ICD transmission.   

## 2017-10-07 NOTE — Progress Notes (Signed)
EPIC Encounter for ICM Monitoring  Patient Name: Rhonda [name] is a 67 y.o. female Date: 10/07/2017 Primary Care Physican: Maurice Small, MD Primary [place] Electrophysiologist: Lovena Le Dry Weight: Previous ICM238 lbs Bi-V Pacing: 100%        Attempted call to patient and unable to reach.  Left message to return call.  Transmission reviewed.    Thoracic impedance abnormal suggesting fluid accumulation.  Prescribed dosage: Torsemide 20 mg 2 tablets (40 mg total) every morning and 1 tablet (20 mg total) every afternoon.Potassium 20 meq 2tablets every morning and 1 tablet (20 mEq total) every afternoon. Metolazone 2.2m 1 tablet by mouth if needed for fluid or edema.  Per HF clinic office notes 09/23/2016 patient should take extra Furosemide if needed in the afternoon.  Labs: 07/28/2017 Creatinine 1.69, BUN 53, Potassium 3.0, Sodium 139, EGFR 31-36 (low potassium addressed by HF clinic) 06/21/2017 Creatinine 2.06, BUN 38, Potassium 2.5, Sodium 136, EGFR 24-28 drawn in ER and was given 80 mEq of Potassium during ER visit  Recommendations: NONE - Unable to reach.  Follow-up plan: ICM clinic phone appointment on 10/14/2017.  Office appointment scheduled 10/21/2017 with Heart Failure Clinic NP/PA.  Copy of ICM check sent to Dr. MAundra Dubinand Dr. TLovena Le   3 month ICM trend: 10/07/2017    1 Year ICM trend:       [name] RN 10/07/2017 3:59 PM

## 2017-10-08 ENCOUNTER — Encounter: Payer: Self-pay | Admitting: Cardiology

## 2017-10-08 DIAGNOSIS — I5022 Chronic systolic (congestive) heart failure: Secondary | ICD-10-CM | POA: Diagnosis not present

## 2017-10-08 DIAGNOSIS — I429 Cardiomyopathy, unspecified: Secondary | ICD-10-CM | POA: Diagnosis not present

## 2017-10-08 DIAGNOSIS — D649 Anemia, unspecified: Secondary | ICD-10-CM | POA: Diagnosis not present

## 2017-10-08 DIAGNOSIS — I110 Hypertensive heart disease with heart failure: Secondary | ICD-10-CM | POA: Diagnosis not present

## 2017-10-08 DIAGNOSIS — E1142 Type 2 diabetes mellitus with diabetic polyneuropathy: Secondary | ICD-10-CM | POA: Diagnosis not present

## 2017-10-08 DIAGNOSIS — K219 Gastro-esophageal reflux disease without esophagitis: Secondary | ICD-10-CM | POA: Diagnosis not present

## 2017-10-08 DIAGNOSIS — E039 Hypothyroidism, unspecified: Secondary | ICD-10-CM | POA: Diagnosis not present

## 2017-10-08 DIAGNOSIS — F329 Major depressive disorder, single episode, unspecified: Secondary | ICD-10-CM | POA: Diagnosis not present

## 2017-10-08 DIAGNOSIS — E785 Hyperlipidemia, unspecified: Secondary | ICD-10-CM | POA: Diagnosis not present

## 2017-10-08 LAB — CUP PACEART REMOTE DEVICE CHECK
Battery Remaining Longevity: 24 mo
Battery Voltage: 2.94 V
Brady Statistic AP VP Percent: 0.03 %
Brady Statistic AP VS Percent: 0.01 %
Brady Statistic AS VP Percent: 99.94 %
Brady Statistic AS VS Percent: 0.01 %
Brady Statistic RA Percent Paced: 0.04 %
Brady Statistic RV Percent Paced: 99.97 %
Date Time Interrogation Session: 20190516052204
HighPow Impedance: 82 Ohm
Implantable Lead Implant Date: 20141210
Implantable Lead Implant Date: 20141210
Implantable Lead Implant Date: 20141210
Implantable Lead Location: 753858
Implantable Lead Location: 753859
Implantable Lead Location: 753860
Implantable Lead Model: 4396
Implantable Lead Model: 5076
Implantable Lead Model: 6935
Implantable Pulse Generator Implant Date: 20141210
Lead Channel Impedance Value: 399 Ohm
Lead Channel Impedance Value: 399 Ohm
Lead Channel Impedance Value: 494 Ohm
Lead Channel Impedance Value: 570 Ohm
Lead Channel Impedance Value: 646 Ohm
Lead Channel Impedance Value: 665 Ohm
Lead Channel Pacing Threshold Amplitude: 0.75 V
Lead Channel Pacing Threshold Amplitude: 1.125 V
Lead Channel Pacing Threshold Amplitude: 1.25 V
Lead Channel Pacing Threshold Pulse Width: 0.4 ms
Lead Channel Pacing Threshold Pulse Width: 0.4 ms
Lead Channel Pacing Threshold Pulse Width: 0.4 ms
Lead Channel Sensing Intrinsic Amplitude: 2.625 mV
Lead Channel Sensing Intrinsic Amplitude: 2.625 mV
Lead Channel Sensing Intrinsic Amplitude: 27.25 mV
Lead Channel Sensing Intrinsic Amplitude: 27.25 mV
Lead Channel Setting Pacing Amplitude: 2.25 V
Lead Channel Setting Pacing Amplitude: 2.5 V
Lead Channel Setting Pacing Amplitude: 2.75 V
Lead Channel Setting Pacing Pulse Width: 0.4 ms
Lead Channel Setting Pacing Pulse Width: 0.4 ms
Lead Channel Setting Sensing Sensitivity: 0.3 mV

## 2017-10-08 MED ORDER — TORSEMIDE 20 MG PO TABS: ORAL_TABLET | ORAL | 3 refills | Status: DC

## 2017-10-08 NOTE — Progress Notes (Signed)
Letter  

## 2017-10-08 NOTE — Progress Notes (Addendum)
Call to patient.  She said she has been trying to follow low salt diet.  Weight is fluctuating between 222 lbs to 231 lbs. Today's weight is 225.1 lbs.  Advised Dr Aundra Dubin ordered to change Torsemide 20 mg 2 tablets (40 mg total) twice a day and KCl 2 tablets (40 mEq total) twice a day. BMET 1 week.  Her husband is driving her to appointments and she can have BMET done on Thursday, 10/14/2017

## 2017-10-08 NOTE — Progress Notes (Addendum)
Call to patient. Dr Aundra Dubin advised to increase Torsemide to 40 bid and KCl to 40 bid.  BMET 1 week.

## 2017-10-08 NOTE — Addendum Note (Signed)
Addended by: [name] on: 10/08/2017 12:53 PM   Modules accepted: Orders

## 2017-10-08 NOTE — Progress Notes (Signed)
Increase torsemide to 40 bid and KCl to 40 bid.  BMET 1 week.

## 2017-10-11 DIAGNOSIS — D649 Anemia, unspecified: Secondary | ICD-10-CM | POA: Diagnosis not present

## 2017-10-11 DIAGNOSIS — I429 Cardiomyopathy, unspecified: Secondary | ICD-10-CM | POA: Diagnosis not present

## 2017-10-11 DIAGNOSIS — F329 Major depressive disorder, single episode, unspecified: Secondary | ICD-10-CM | POA: Diagnosis not present

## 2017-10-11 DIAGNOSIS — K219 Gastro-esophageal reflux disease without esophagitis: Secondary | ICD-10-CM | POA: Diagnosis not present

## 2017-10-11 DIAGNOSIS — I110 Hypertensive heart disease with heart failure: Secondary | ICD-10-CM | POA: Diagnosis not present

## 2017-10-11 DIAGNOSIS — E785 Hyperlipidemia, unspecified: Secondary | ICD-10-CM | POA: Diagnosis not present

## 2017-10-11 DIAGNOSIS — E1142 Type 2 diabetes mellitus with diabetic polyneuropathy: Secondary | ICD-10-CM | POA: Diagnosis not present

## 2017-10-11 DIAGNOSIS — E039 Hypothyroidism, unspecified: Secondary | ICD-10-CM | POA: Diagnosis not present

## 2017-10-11 DIAGNOSIS — I5022 Chronic systolic (congestive) heart failure: Secondary | ICD-10-CM | POA: Diagnosis not present

## 2017-10-14 ENCOUNTER — Telehealth: Payer: Self-pay

## 2017-10-14 ENCOUNTER — Ambulatory Visit (INDEPENDENT_AMBULATORY_CARE_PROVIDER_SITE_OTHER): Payer: Self-pay

## 2017-10-14 ENCOUNTER — Other Ambulatory Visit (HOSPITAL_COMMUNITY): Payer: Medicare HMO

## 2017-10-14 DIAGNOSIS — I509 Heart failure, unspecified: Secondary | ICD-10-CM

## 2017-10-14 DIAGNOSIS — Z95810 Presence of automatic (implantable) cardiac defibrillator: Secondary | ICD-10-CM

## 2017-10-14 NOTE — Telephone Encounter (Signed)
Remote ICM transmission received.  Attempted call to patient and left message to return call. 

## 2017-10-14 NOTE — Progress Notes (Signed)
EPIC Encounter for ICM Monitoring  Patient Name: Rhonda [name] is a 67 y.o. female Date: 10/14/2017 Primary Care Physican: Maurice Small, MD Primary [place] Electrophysiologist: Druscilla Brownie Weight: office visit [id number] lbs5/2/19 Bi-V Pacing: 100%       Attempted call to patient and unable to reach.  Left message to return call.  Transmission reviewed.   Patient did not have BMET drawn today.     Thoracic impedance continues to be abnormal even usage of PRN Metolazone (advised to take on 5/2 at HF clinic office visit).  Prescribed dosage: Torsemide 20 mg 2 tablets (40 mg total) every morning and 2 tablets (40 mg total) every afternoon.Potassium 20 meq 2tablets every morning and 1 tablet (20 mEq total) every afternoon. Metolazone 2.32m 1 tablet by mouth if needed for fluid or edema.  Labs: 07/28/2017 Creatinine 1.69, BUN 53, Potassium 3.0, Sodium 139, EGFR 31-36 (low potassium addressed by HF clinic) 06/21/2017 Creatinine 2.06, BUN 38, Potassium 2.5, Sodium 136, EGFR 24-28 drawn in ER and was given 80 mEq of Potassium during ER visit  Recommendations: NONE - Unable to reach.  Follow-up plan: ICM clinic phone appointment on 10/26/2017 to recheck fluid levels.  Office appointment scheduled 10/29/2017 with HF clinic NP/PA.  Copy of ICM check sent to Dr. MAundra Dubinand Dr. TLovena Le   3 month ICM trend: 10/14/2017    1 Year ICM trend:       [name] RN 10/14/2017 3:35 PM

## 2017-10-14 NOTE — Progress Notes (Signed)
Increase torsemide to 60 mg bid with BMET 1 week.

## 2017-10-15 MED ORDER — TORSEMIDE 20 MG PO TABS: ORAL_TABLET | ORAL | 3 refills | Status: DC

## 2017-10-15 NOTE — Progress Notes (Signed)
Call to patient. Advised Dr Aundra Dubin increased Torsemide to 60 mg twice a day.  She did not get BMET 5/23 as ordered last week and advised to get BMET drawn on 10/21/2017.  Current weight is 221lbs and advised to call for weight gain of 3-5 lbs.  She is unsure why she is retaining fluid and discussed salt and fluid intake.

## 2017-10-16 DIAGNOSIS — I110 Hypertensive heart disease with heart failure: Secondary | ICD-10-CM | POA: Diagnosis not present

## 2017-10-16 DIAGNOSIS — K219 Gastro-esophageal reflux disease without esophagitis: Secondary | ICD-10-CM | POA: Diagnosis not present

## 2017-10-16 DIAGNOSIS — D649 Anemia, unspecified: Secondary | ICD-10-CM | POA: Diagnosis not present

## 2017-10-16 DIAGNOSIS — I5022 Chronic systolic (congestive) heart failure: Secondary | ICD-10-CM | POA: Diagnosis not present

## 2017-10-16 DIAGNOSIS — I429 Cardiomyopathy, unspecified: Secondary | ICD-10-CM | POA: Diagnosis not present

## 2017-10-16 DIAGNOSIS — E1142 Type 2 diabetes mellitus with diabetic polyneuropathy: Secondary | ICD-10-CM | POA: Diagnosis not present

## 2017-10-16 DIAGNOSIS — E039 Hypothyroidism, unspecified: Secondary | ICD-10-CM | POA: Diagnosis not present

## 2017-10-16 DIAGNOSIS — F329 Major depressive disorder, single episode, unspecified: Secondary | ICD-10-CM | POA: Diagnosis not present

## 2017-10-16 DIAGNOSIS — E785 Hyperlipidemia, unspecified: Secondary | ICD-10-CM | POA: Diagnosis not present

## 2017-10-21 ENCOUNTER — Ambulatory Visit (HOSPITAL_COMMUNITY)
Admission: RE | Admit: 2017-10-21 | Discharge: 2017-10-21 | Disposition: A | Discharge status: Home / Self Care | Payer: Medicare HMO | Source: Ambulatory Visit | Attending: Cardiology | Admitting: Cardiology

## 2017-10-21 ENCOUNTER — Encounter (HOSPITAL_COMMUNITY): Payer: Medicare HMO

## 2017-10-21 DIAGNOSIS — I509 Heart failure, unspecified: Secondary | ICD-10-CM | POA: Insufficient documentation

## 2017-10-21 LAB — BASIC METABOLIC PANEL
Anion gap: 10 (ref 5–15)
BUN: 19 mg/dL (ref 6–20)
CO2: 30 mmol/L (ref 22–32)
Calcium: 8.8 mg/dL — ABNORMAL LOW (ref 8.9–10.3)
Chloride: 100 mmol/L — ABNORMAL LOW (ref 101–111)
Creatinine, Ser: 1.57 mg/dL — ABNORMAL HIGH (ref 0.44–1.00)
GFR calc Af Amer: 39 mL/min — ABNORMAL LOW (ref >60)
GFR calc non Af Amer: 34 mL/min — ABNORMAL LOW (ref >60)
Glucose, Bld: 120 mg/dL — ABNORMAL HIGH (ref 65–99)
Potassium: 3.8 mmol/L (ref 3.5–5.1)
Sodium: 140 mmol/L (ref 135–145)

## 2017-10-22 DIAGNOSIS — D649 Anemia, unspecified: Secondary | ICD-10-CM | POA: Diagnosis not present

## 2017-10-22 DIAGNOSIS — I5022 Chronic systolic (congestive) heart failure: Secondary | ICD-10-CM | POA: Diagnosis not present

## 2017-10-22 DIAGNOSIS — K219 Gastro-esophageal reflux disease without esophagitis: Secondary | ICD-10-CM | POA: Diagnosis not present

## 2017-10-22 DIAGNOSIS — I110 Hypertensive heart disease with heart failure: Secondary | ICD-10-CM | POA: Diagnosis not present

## 2017-10-22 DIAGNOSIS — E039 Hypothyroidism, unspecified: Secondary | ICD-10-CM | POA: Diagnosis not present

## 2017-10-22 DIAGNOSIS — E785 Hyperlipidemia, unspecified: Secondary | ICD-10-CM | POA: Diagnosis not present

## 2017-10-22 DIAGNOSIS — I429 Cardiomyopathy, unspecified: Secondary | ICD-10-CM | POA: Diagnosis not present

## 2017-10-22 DIAGNOSIS — F329 Major depressive disorder, single episode, unspecified: Secondary | ICD-10-CM | POA: Diagnosis not present

## 2017-10-22 DIAGNOSIS — E1142 Type 2 diabetes mellitus with diabetic polyneuropathy: Secondary | ICD-10-CM | POA: Diagnosis not present

## 2017-10-26 ENCOUNTER — Encounter (HOSPITAL_COMMUNITY): Payer: Medicare HMO | Admitting: Cardiology

## 2017-10-26 ENCOUNTER — Telehealth: Payer: Self-pay | Admitting: Cardiology

## 2017-10-26 ENCOUNTER — Ambulatory Visit (INDEPENDENT_AMBULATORY_CARE_PROVIDER_SITE_OTHER): Payer: Self-pay

## 2017-10-26 DIAGNOSIS — I509 Heart failure, unspecified: Secondary | ICD-10-CM

## 2017-10-26 NOTE — Telephone Encounter (Signed)
Spoke with pt and reminded pt of remote transmission that is due today. Pt verbalized understanding.   

## 2017-10-28 DIAGNOSIS — F0632 Mood disorder due to known physiological condition with major depressive-like episode: Secondary | ICD-10-CM | POA: Diagnosis not present

## 2017-10-28 DIAGNOSIS — F3289 Other specified depressive episodes: Secondary | ICD-10-CM | POA: Diagnosis not present

## 2017-10-28 NOTE — Progress Notes (Signed)
EPIC Encounter for ICM Monitoring  Patient Name: Rhonda [name] is a 67 y.o. female Date: 10/28/2017 Primary Care Physican: Maurice Small, MD Primary [place] Electrophysiologist: Lovena Le Dry Weight: 219 lbs Bi-V Pacing: 100%       Heart Failure questions reviewed, pt asymptomatic.   Patient reported her Torsemide dosage was increased, 2 weeks ago, to Torsemide 20 mg to 3 tablets (60 mg total) to 3 times a day and Potassium 20 meq (40 mEq total) to 3 times a day.   Thoracic impedance continues to be abnormal suggesting fluid accumulation 08/29/2017.  Prescribed dosage: Torsemide 20 mg 3 tablets (60 mg total) twice a day.Potassium 20 meq 2tablets twice a day. Metolazone 2.7m 1 tablet by mouth if needed for fluid or edema.  Labs: 10/21/2017 Creatinine 1.57, BUN 19, Potassium 3.8, Sodium 140, EGFR 34-39 09/23/2017 Creatinine 1.26, BUN 29, Potassium 3.2, Sodium 142, EGFR 49-56 09/09/2017 Creatinine 1.22, BUN 20, Potassium 3.4, Sodium 140, EGFR 46-53 08/25/2017 Creatinine 1.52, BUN 47, Potassium 3.1, Sodium 137, EGFR 35-41 07/28/2017 Creatinine 1.69, BUN 53, Potassium 3.0, Sodium 139, EGFR 31-36 (low potassium addressed by HF clinic) 06/21/2017 Creatinine 2.06, BUN 38, Potassium 2.5, Sodium 136, EGFR 24-28 drawn in ER and was given 80 mEq of Potassium during ER visit  Recommendations:  Advised unable to locate the Torsemide and Potassium dosage increase to taking them both 3 times a day. Advised to discuss medications with Dr MAundra Dubinat tomorrow's appointment.  Patient has difficulty with following medication dose changes.      Follow-up plan: ICM clinic phone appointment on 11/11/2017.  Office appointment scheduled 10/29/2017 with HF clinic NP/PA.   Message sent to HF clinic that patient is taking Torsemide and Potassium 3 x a day.   Copy of ICM check sent to Dr. MAundra Dubinand Dr. TLovena Le   3 month ICM trend: 10/26/2017    1 Year ICM trend:       [name]  RN 10/28/2017 11:09 AM

## 2017-10-29 ENCOUNTER — Ambulatory Visit (HOSPITAL_COMMUNITY)
Admission: RE | Admit: 2017-10-29 | Discharge: 2017-10-29 | Disposition: A | Discharge status: Home / Self Care | Payer: Medicare HMO | Source: Ambulatory Visit | Attending: Cardiology | Admitting: Cardiology

## 2017-10-29 VITALS — BP 106/68 | HR 79 | Wt 220.5 lb

## 2017-10-29 DIAGNOSIS — I951 Orthostatic hypotension: Secondary | ICD-10-CM

## 2017-10-29 DIAGNOSIS — I110 Hypertensive heart disease with heart failure: Secondary | ICD-10-CM | POA: Insufficient documentation

## 2017-10-29 DIAGNOSIS — Z79899 Other long term (current) drug therapy: Secondary | ICD-10-CM | POA: Insufficient documentation

## 2017-10-29 DIAGNOSIS — I429 Cardiomyopathy, unspecified: Secondary | ICD-10-CM | POA: Insufficient documentation

## 2017-10-29 DIAGNOSIS — R296 Repeated falls: Secondary | ICD-10-CM | POA: Diagnosis not present

## 2017-10-29 DIAGNOSIS — Z6841 Body Mass Index (BMI) 40.0 and over, adult: Secondary | ICD-10-CM | POA: Diagnosis not present

## 2017-10-29 DIAGNOSIS — E785 Hyperlipidemia, unspecified: Secondary | ICD-10-CM | POA: Diagnosis not present

## 2017-10-29 DIAGNOSIS — E1140 Type 2 diabetes mellitus with diabetic neuropathy, unspecified: Secondary | ICD-10-CM | POA: Diagnosis not present

## 2017-10-29 DIAGNOSIS — Z79890 Hormone replacement therapy (postmenopausal): Secondary | ICD-10-CM | POA: Insufficient documentation

## 2017-10-29 DIAGNOSIS — Z9981 Dependence on supplemental oxygen: Secondary | ICD-10-CM | POA: Insufficient documentation

## 2017-10-29 DIAGNOSIS — K219 Gastro-esophageal reflux disease without esophagitis: Secondary | ICD-10-CM | POA: Diagnosis not present

## 2017-10-29 DIAGNOSIS — I878 Other specified disorders of veins: Secondary | ICD-10-CM | POA: Diagnosis not present

## 2017-10-29 DIAGNOSIS — Z9181 History of falling: Secondary | ICD-10-CM | POA: Insufficient documentation

## 2017-10-29 DIAGNOSIS — M109 Gout, unspecified: Secondary | ICD-10-CM | POA: Diagnosis not present

## 2017-10-29 DIAGNOSIS — E039 Hypothyroidism, unspecified: Secondary | ICD-10-CM | POA: Insufficient documentation

## 2017-10-29 DIAGNOSIS — J45909 Unspecified asthma, uncomplicated: Secondary | ICD-10-CM | POA: Insufficient documentation

## 2017-10-29 DIAGNOSIS — F329 Major depressive disorder, single episode, unspecified: Secondary | ICD-10-CM | POA: Diagnosis not present

## 2017-10-29 DIAGNOSIS — Z853 Personal history of malignant neoplasm of breast: Secondary | ICD-10-CM | POA: Insufficient documentation

## 2017-10-29 DIAGNOSIS — E559 Vitamin D deficiency, unspecified: Secondary | ICD-10-CM | POA: Insufficient documentation

## 2017-10-29 DIAGNOSIS — Z95810 Presence of automatic (implantable) cardiac defibrillator: Secondary | ICD-10-CM

## 2017-10-29 DIAGNOSIS — I428 Other cardiomyopathies: Secondary | ICD-10-CM

## 2017-10-29 DIAGNOSIS — D649 Anemia, unspecified: Secondary | ICD-10-CM | POA: Diagnosis not present

## 2017-10-29 DIAGNOSIS — I5022 Chronic systolic (congestive) heart failure: Secondary | ICD-10-CM | POA: Diagnosis not present

## 2017-10-29 DIAGNOSIS — F419 Anxiety disorder, unspecified: Secondary | ICD-10-CM | POA: Insufficient documentation

## 2017-10-29 DIAGNOSIS — E1142 Type 2 diabetes mellitus with diabetic polyneuropathy: Secondary | ICD-10-CM | POA: Diagnosis not present

## 2017-10-29 LAB — BASIC METABOLIC PANEL
Anion gap: 9 (ref 5–15)
BUN: 24 mg/dL — ABNORMAL HIGH (ref 6–20)
CO2: 30 mmol/L (ref 22–32)
Calcium: 9.1 mg/dL (ref 8.9–10.3)
Chloride: 102 mmol/L (ref 101–111)
Creatinine, Ser: 1.45 mg/dL — ABNORMAL HIGH (ref 0.44–1.00)
GFR calc Af Amer: 43 mL/min — ABNORMAL LOW (ref >60)
GFR calc non Af Amer: 37 mL/min — ABNORMAL LOW (ref >60)
Glucose, Bld: 111 mg/dL — ABNORMAL HIGH (ref 65–99)
Potassium: 3.9 mmol/L (ref 3.5–5.1)
Sodium: 141 mmol/L (ref 135–145)

## 2017-10-29 MED ORDER — POTASSIUM CHLORIDE CRYS ER 20 MEQ PO TBCR: 60.0000 meq | EXTENDED_RELEASE_TABLET | Freq: Two times a day (BID) | ORAL | 11 refills | Status: DC

## 2017-10-29 MED ORDER — TORSEMIDE 20 MG PO TABS: 60.0000 mg | ORAL_TABLET | Freq: Two times a day (BID) | ORAL | 11 refills | Status: DC

## 2017-10-29 NOTE — Progress Notes (Signed)
Patient ID: Rhonda [name], female   DOB: June 10, 1950, 67 y.o.   MRN: [id number]  Primary Cardiologist: Dr Rhonda [name]  General Surgeon: Dr Rhonda [name]  Orthopedic: Dr Rhonda [name]  PCP: Dr Rhonda [name] Referring MD: Dr Rhonda [name]  HPI: Rhonda [name] is a 67 y.o. 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 She has had sleep study with no  evidence of sleep apnea.  She has a Medtronic CRT-D device.  Last echo in 7/18 showed recovery of EF to 55-60%.   Today she returns for HF follow up. Overall feeling fair. Very frustrated because she has had multiple falls over the last 4 months. Followed by Neurology. Having ongoing dizziness.  Denies SOB/PND/Orthopnea. Appetite ok. No fever or chills. Weight at home 217-222 pounds. Taking all medications but she has been taking extra 60 mg torsemide over the last few weeks.   Optivol: Not available.   10/14/12 ECHO EF 35% RV ok  04/12/13 ECHO EF 20-25%, LV moderately dilated 10/11/2013 ECHO EF 45-50% 7/18 ECHO EF 55-60%, normal RV size and systolic function, PASP 34 mmHg  11/24/12 Creatinine 1.23 Potassium 4.1 12/15/12 Creatinine 2.03 Potassium 3.3  12/22/12 Creatinine 1.2 Potassium 3.8 02/23/13 Creatinine 1.68 K 4.4  05/02/13 K 4.2 Creatinine 1.8  10/11/13 K 3.7 Creatinine 1.69 10/17 K 4.1, creatinine 1.35 09/2017: K3.8 Creatinine 1.57  Review of systems complete and found to be negative unless listed in HPI.    Past Medical History:  Diagnosis Date  . Anemia   . Anxiety   . Arthritis    RIGHT KNEE ARTHRITIS AND PAIN-PT TOLD BONE ON BONE  . Asthma   . Back pain    DISK PROBLEM  . Breast cancer, stage 1 ([place]) 03/26/2011   left-FINISHED CHEMO  AND  RADIATION  . Cardiomyopathy    PT'S CARDIOLOGIST IS DR. Tressia Miners [name]  . Chronic systolic heart failure (HCC)    a) NICM b) ECHO (03/2013) EF 20-25% c) ECHO (09/2013) EF 45-50%, grade I DD  . Complication of anesthesia    PT STATES HER B/P LOW AFTER ONE OF HER SURGERIES--SHE ATTRIBUTES TO LYING FLAT  . Depression   . Diabetes mellitus    "diet controlled" (05/04/2103)  . Exertional shortness of breath   . GERD (gastroesophageal reflux disease)   . Gout   . Heart murmur   . Hyperlipidemia   . Hypertension   . Hypothyroidism   . Left bundle branch block    s/p CRT-D (04/2013)  . Migraines   . On home oxygen therapy    "2L suppose to be q night" (05/03/2013)  . Peripheral neuropathy    feet  . SVD (spontaneous vaginal delivery)    x 2  . Unspecified vitamin D deficiency 03/26/2011   does not take meds    Current Outpatient Medications  Medication Sig Dispense Refill  . ARIPiprazole (ABILIFY) 2 MG tablet Take 2 mg by mouth every morning.     Marland Kitchen buPROPion (WELLBUTRIN XL) 150 MG 24 hr tablet Take 450 mg by mouth every morning.     . carvedilol (COREG) 3.125 MG tablet Take 1 tablet (3.125 mg total) by mouth 2 (two) times daily with a meal. 60 tablet 3  .  citalopram (CELEXA) 10 MG tablet Take 10 mg by mouth daily.    . clindamycin (CLEOCIN) 300 MG capsule Take 600 mg by mouth as needed (1 hour prior to dental work).    . clonazePAM (KLONOPIN) 0.5 MG tablet Take 0.5 mg by mouth 2 (two) times daily.     . febuxostat (ULORIC) 40 MG tablet Take 40 mg by mouth at bedtime.     . fexofenadine (ALLEGRA) 180 MG tablet Take 180 mg by mouth 2 (two) times daily.    Marland Kitchen gabapentin (NEURONTIN) 300 MG capsule Take 300 mg by mouth 3 (three) times daily.     Marland Kitchen gabapentin (NEURONTIN) 800 MG tablet Take 800 mg by mouth as needed.    Marland Kitchen levothyroxine (SYNTHROID, LEVOTHROID) 125 MCG tablet Take 125 mcg by mouth daily before breakfast. On an empty stomach    . metolazone (ZAROXOLYN) 2.5 MG tablet take 1 tablet  by mouth if needed for FLUID OR EDEMA 30 tablet 2  . montelukast (SINGULAIR) 10 MG tablet Take 10 mg by mouth at bedtime.   1  . potassium chloride SA (K-DUR,KLOR-CON) 20 MEQ tablet Take 3 tablets (60 mEq total) by mouth 2 (two) times daily. 180 tablet 11  . simvastatin (ZOCOR) 10 MG tablet Take 10 mg by mouth every evening.     Marland Kitchen spironolactone (ALDACTONE) 25 MG tablet Take 0.5 tablets (12.5 mg total) by mouth daily. 45 tablet 3  . torsemide (DEMADEX) 20 MG tablet Take 3 tablets (60 mg total) by mouth 2 (two) times daily. 180 tablet 11  . zolpidem (AMBIEN) 10 MG tablet Take 10 mg by mouth at bedtime as needed. For sleep     Current Facility-Administered Medications  Medication Dose Route Frequency Provider Last Rate Last Dose  . triamcinolone acetonide (KENALOG-40) injection 20 mg  20 mg Other Once Rhonda [name], [place]        Vitals:   10/29/17 0924 10/29/17 1015 10/29/17 1016  BP: 118/68 126/76 106/68  Pulse: 79    SpO2: 99%    Weight: 220 lb 8 oz (100 kg)      Wt Readings from Last 3 Encounters:  10/29/17 220 lb 8 oz (100 kg)  09/23/17 232 lb 9.6 oz (105.5 kg)  08/25/17 222 lb 6.4 oz (100.9 kg)    PHYSICAL EXAM: General:   No resp difficulty> walked slowly in the clinic with her husband.  HEENT: normal Neck: supple. JVP 5-6. Carotids 2+ bilat; no bruits. No lymphadenopathy or thryomegaly appreciated. Cor: PMI nondisplaced. Regular rate & rhythm. No rubs, gallops or murmurs. Lungs: clear Abdomen: obese, soft, nontender, nondistended. No hepatosplenomegaly. No bruits or masses. Good bowel sounds. Extremities: no cyanosis, clubbing, rash, R and LLE compression wraps  Neuro: alert & orientedx3, cranial nerves grossly intact. moves all 4 extremities w/o difficulty. Affect pleasant  ASSESSMENT & PLAN:  1) Chronic systolic HF: NICM s/p Medtronic CRT-D, ?Viral myocarditis. Cardiomyopathy dates from 79. EF 45-50% (09/2013), EF up to 55-60% on 7/18 echo.  PYP 09/02/17 negative for TTR  (Grade 0-1, H/CCL 1.2). SPEP with no M-spike.  - NYHA II. Volume status low in the setting of medication errors because she has been taking an extra 60 mg of torsemide daily for the last few weeks. [place] BMET today.  - Continue coreg 3.125 mg BID. Has not tolerated higher doses with orthostasis.  - Continue spiro 12.5 mg daily.  - 2) Obesity - Body mass index is 41.66 kg/m.  - Encouraged weight loss.  3) HTN -  Stable.  4) Orthostatic Hypotension.  - Neurology has suggested midodrine.  - Orthostatic today but over diuresed.   5) Chronic Venous stasis - Continue compression wraps.  6) Falls  Followed by neurology.  Ask [facility] add PT.   Follow up in 2 months with Dr Haroldine Laws. Referred to HF Paramedicine given medication issues with diuretics and recent falls.   Greater than 50% of the 25 minute visit was spent in counseling/coordination of care regarding disease state education, salt/fluid restriction, sliding scale diuretics, and medication compliance.    Ryllie Nieland NP-C  10:25 AM

## 2017-10-29 NOTE — Patient Instructions (Signed)
Routine lab work today. Will notify you of abnormal results, otherwise no news is good news!  DECREASE Torsemide to 60 mg (3 tabs) TWICE daily.  DECREASE Potassium to 60 meq (3 tabs) TWICE daily.  Will refer you to our Heart Failure [name]. This program is designed to help support you at home with medication management, vital sign and weight measurements, education about heart failure, and any other needs to support you. A certified EMT will call you to set up your initial appointment in the home, and will be available to you at a weekly basis, free of charge.  Follow up with Dr. Haroldine Laws in 2-3 months.  _________________________________________________________________ Rhonda [name] Code:  Take all medication as prescribed the day of your appointment. Bring all medications with you to your appointment.  Do the following things EVERYDAY: 1) Weigh yourself in the morning before breakfast. Write it down and keep it in a log. 2) Take your medicines as prescribed 3) Eat low salt foods-Limit salt (sodium) to 2000 mg per day.  4) Stay as active as you can everyday 5) Limit all fluids for the day to less than 2 liters

## 2017-11-05 DIAGNOSIS — F329 Major depressive disorder, single episode, unspecified: Secondary | ICD-10-CM | POA: Diagnosis not present

## 2017-11-05 DIAGNOSIS — E039 Hypothyroidism, unspecified: Secondary | ICD-10-CM | POA: Diagnosis not present

## 2017-11-05 DIAGNOSIS — I429 Cardiomyopathy, unspecified: Secondary | ICD-10-CM | POA: Diagnosis not present

## 2017-11-05 DIAGNOSIS — I110 Hypertensive heart disease with heart failure: Secondary | ICD-10-CM | POA: Diagnosis not present

## 2017-11-05 DIAGNOSIS — E785 Hyperlipidemia, unspecified: Secondary | ICD-10-CM | POA: Diagnosis not present

## 2017-11-05 DIAGNOSIS — E1142 Type 2 diabetes mellitus with diabetic polyneuropathy: Secondary | ICD-10-CM | POA: Diagnosis not present

## 2017-11-05 DIAGNOSIS — I5022 Chronic systolic (congestive) heart failure: Secondary | ICD-10-CM | POA: Diagnosis not present

## 2017-11-05 DIAGNOSIS — K219 Gastro-esophageal reflux disease without esophagitis: Secondary | ICD-10-CM | POA: Diagnosis not present

## 2017-11-05 DIAGNOSIS — D649 Anemia, unspecified: Secondary | ICD-10-CM | POA: Diagnosis not present

## 2017-11-10 DIAGNOSIS — Z853 Personal history of malignant neoplasm of breast: Secondary | ICD-10-CM | POA: Diagnosis not present

## 2017-11-10 DIAGNOSIS — Z1231 Encounter for screening mammogram for malignant neoplasm of breast: Secondary | ICD-10-CM | POA: Diagnosis not present

## 2017-11-11 ENCOUNTER — Ambulatory Visit (INDEPENDENT_AMBULATORY_CARE_PROVIDER_SITE_OTHER): Payer: Self-pay

## 2017-11-11 DIAGNOSIS — Z95810 Presence of automatic (implantable) cardiac defibrillator: Secondary | ICD-10-CM

## 2017-11-11 DIAGNOSIS — F329 Major depressive disorder, single episode, unspecified: Secondary | ICD-10-CM | POA: Diagnosis not present

## 2017-11-11 DIAGNOSIS — I5022 Chronic systolic (congestive) heart failure: Secondary | ICD-10-CM | POA: Diagnosis not present

## 2017-11-11 DIAGNOSIS — K219 Gastro-esophageal reflux disease without esophagitis: Secondary | ICD-10-CM | POA: Diagnosis not present

## 2017-11-11 DIAGNOSIS — I110 Hypertensive heart disease with heart failure: Secondary | ICD-10-CM | POA: Diagnosis not present

## 2017-11-11 DIAGNOSIS — I429 Cardiomyopathy, unspecified: Secondary | ICD-10-CM | POA: Diagnosis not present

## 2017-11-11 DIAGNOSIS — E785 Hyperlipidemia, unspecified: Secondary | ICD-10-CM | POA: Diagnosis not present

## 2017-11-11 DIAGNOSIS — E1142 Type 2 diabetes mellitus with diabetic polyneuropathy: Secondary | ICD-10-CM | POA: Diagnosis not present

## 2017-11-11 DIAGNOSIS — D649 Anemia, unspecified: Secondary | ICD-10-CM | POA: Diagnosis not present

## 2017-11-11 DIAGNOSIS — E039 Hypothyroidism, unspecified: Secondary | ICD-10-CM | POA: Diagnosis not present

## 2017-11-11 NOTE — Progress Notes (Signed)
EPIC Encounter for ICM Monitoring  Patient Name: Rhonda [name] is a 67 y.o. female Date: 11/11/2017 Primary Care Physican: Maurice Small, MD Primary Cardiologist:[place] Electrophysiologist: Lovena Le Dry Weight:218lbs Bi-V Pacing: 100%            Heart Failure questions reviewed, pt asymptomatic.  Legs are being wrapped to help swelling.  Referred by HF Clinic to Heart Failure [name] to help with medication management.    Thoracic impedance continues to be abnormal suggesting fluid accumulation.  Prescribed dosage: Torsemide 20 mg 3 tablets (60 mg total) twice a day.Potassium 20 meq 3tablets (60 mEq total) twice a day. Metolazone 2.72m 1 tablet by mouth if needed for fluid or edema.  Labs: 10/29/2017 Creatinine 1.45, BUN 24, Potassium 3.9, Sodium 141, EGFR 37-43 10/21/2017 Creatinine 1.57, BUN 19, Potassium 3.8, Sodium 140, EGFR 34-39 09/23/2017 Creatinine 1.26, BUN 29, Potassium 3.2, Sodium 142, EGFR 49-56 09/09/2017 Creatinine 1.22, BUN 20, Potassium 3.4, Sodium 140, EGFR 46-53 08/25/2017 Creatinine 1.52, BUN 47, Potassium 3.1, Sodium 137, EGFR 35-41 07/28/2017 Creatinine 1.69, BUN 53, Potassium 3.0, Sodium 139, EGFR 31-36 (low potassium addressed by HF clinic) 06/21/2017 Creatinine 2.06, BUN 38, Potassium 2.5, Sodium 136, EGFR 24-28  Recommendations: No changes.   Encouraged to call for fluid symptoms.  Follow-up plan: ICM clinic phone appointment on 12/02/2017.  Office appointment scheduled 12/23/2017 with Dr. BHaroldine Laws  Copy of ICM check sent to Dr. BHaroldine Lawsand Dr. TLovena Le   3 month ICM trend: 11/11/2017    1 Year ICM trend:       [name] RN 11/11/2017 11:22 AM

## 2017-11-18 ENCOUNTER — Telehealth: Payer: Self-pay | Admitting: *Deleted

## 2017-11-18 ENCOUNTER — Other Ambulatory Visit (HOSPITAL_COMMUNITY): Payer: Self-pay | Admitting: Student

## 2017-11-18 ENCOUNTER — Other Ambulatory Visit: Payer: Self-pay | Admitting: *Deleted

## 2017-11-18 DIAGNOSIS — F3289 Other specified depressive episodes: Secondary | ICD-10-CM | POA: Diagnosis not present

## 2017-11-18 DIAGNOSIS — R519 Headache, unspecified: Secondary | ICD-10-CM

## 2017-11-18 DIAGNOSIS — R413 Other amnesia: Secondary | ICD-10-CM

## 2017-11-18 DIAGNOSIS — R51 Headache: Principal | ICD-10-CM

## 2017-11-18 DIAGNOSIS — I509 Heart failure, unspecified: Secondary | ICD-10-CM

## 2017-11-18 DIAGNOSIS — R292 Abnormal reflex: Secondary | ICD-10-CM

## 2017-11-18 DIAGNOSIS — M542 Cervicalgia: Secondary | ICD-10-CM

## 2017-11-18 NOTE — Telephone Encounter (Signed)
Patient called [place] Dr [name] has not called her about her CT. Please advise

## 2017-11-18 NOTE — Telephone Encounter (Signed)
[facility] called and said that patient has a pacemaker that is not compatible with MRI.  Order changed to CT cervical and CT head.

## 2017-11-19 DIAGNOSIS — E039 Hypothyroidism, unspecified: Secondary | ICD-10-CM | POA: Diagnosis not present

## 2017-11-19 DIAGNOSIS — E1142 Type 2 diabetes mellitus with diabetic polyneuropathy: Secondary | ICD-10-CM | POA: Diagnosis not present

## 2017-11-19 DIAGNOSIS — I110 Hypertensive heart disease with heart failure: Secondary | ICD-10-CM | POA: Diagnosis not present

## 2017-11-19 DIAGNOSIS — F329 Major depressive disorder, single episode, unspecified: Secondary | ICD-10-CM | POA: Diagnosis not present

## 2017-11-19 DIAGNOSIS — E785 Hyperlipidemia, unspecified: Secondary | ICD-10-CM | POA: Diagnosis not present

## 2017-11-19 DIAGNOSIS — K219 Gastro-esophageal reflux disease without esophagitis: Secondary | ICD-10-CM | POA: Diagnosis not present

## 2017-11-19 DIAGNOSIS — D649 Anemia, unspecified: Secondary | ICD-10-CM | POA: Diagnosis not present

## 2017-11-19 DIAGNOSIS — I5022 Chronic systolic (congestive) heart failure: Secondary | ICD-10-CM | POA: Diagnosis not present

## 2017-11-19 DIAGNOSIS — I429 Cardiomyopathy, unspecified: Secondary | ICD-10-CM | POA: Diagnosis not present

## 2017-11-24 DIAGNOSIS — M542 Cervicalgia: Secondary | ICD-10-CM | POA: Diagnosis not present

## 2017-11-24 DIAGNOSIS — M898X8 Other specified disorders of bone, other site: Secondary | ICD-10-CM | POA: Diagnosis not present

## 2017-11-24 DIAGNOSIS — R51 Headache: Secondary | ICD-10-CM | POA: Diagnosis not present

## 2017-11-29 ENCOUNTER — Telehealth: Payer: Self-pay | Admitting: Neurology

## 2017-11-29 ENCOUNTER — Telehealth (HOSPITAL_COMMUNITY): Payer: Self-pay | Admitting: *Deleted

## 2017-11-29 DIAGNOSIS — I5022 Chronic systolic (congestive) heart failure: Secondary | ICD-10-CM

## 2017-11-29 NOTE — Telephone Encounter (Signed)
Patient called stating [facility] is no longer coming to house to apply unna boots.  A family member was educated on how to apply them but family member said they were wrapped too tight.  I have placed new order for [facility] to restart unna boots.

## 2017-11-29 NOTE — Telephone Encounter (Signed)
Called and spoke with Pt, advised her we have not rcvd the results form [facility], and due to the holiday, I would expect that to be delayed. Pt to call back by end of week if she has not heard from [place].

## 2017-11-29 NOTE — Telephone Encounter (Signed)
Patient calling to get results from tests done last week.

## 2017-11-29 NOTE — Telephone Encounter (Signed)
Patient calling again about results from a CT scan pt said was done last Wednesday 7.3.19. I am not seeing anything in Epic. Please advise.

## 2017-12-02 ENCOUNTER — Ambulatory Visit (INDEPENDENT_AMBULATORY_CARE_PROVIDER_SITE_OTHER): Payer: Medicare HMO

## 2017-12-02 DIAGNOSIS — Z95810 Presence of automatic (implantable) cardiac defibrillator: Secondary | ICD-10-CM

## 2017-12-02 DIAGNOSIS — I5022 Chronic systolic (congestive) heart failure: Secondary | ICD-10-CM

## 2017-12-02 NOTE — Progress Notes (Signed)
EPIC Encounter for ICM Monitoring  Patient Name: Rhonda [name] is a 67 y.o. female Date: 12/02/2017 Primary Care Physican: Rhonda Small, MD Primary Cardiologist:Rhonda [name] Electrophysiologist: Rhonda [name] Dry Weight:214lbs Bi-V Pacing: 100%       Heart Failure questions reviewed, pt asymptomatic.    Patient is wearing unna boots.  Referred to HF [name] to help with medication management.  She said she ate fast food on 11/30/2017 which may caused increase in fluid accumulation again.    Thoracic impedance abnormal suggesting fluid accumulation but had improved since last transmission until 11/30/2017 when impedance began another downward trend suggesting fluid accumulation increase.  Prescribed dosage: Torsemide 20 mg3tablets (60 mg total)twice a day.Potassium 20 meq 3tablets (60 mEq total) twice a day. Metolazone 2.34m 1 tablet by mouth if needed for fluid or edema.  Labs: 10/29/2017 Creatinine 1.45, BUN 24, Potassium 3.9, Sodium 141, EGFR 37-43 10/21/2017 Creatinine 1.57, BUN 19, Potassium 3.8, Sodium 140, EGFR 34-39 09/23/2017 Creatinine 1.26, BUN 29, Potassium 3.2, Sodium 142, EGFR 49-56 09/09/2017 Creatinine 1.22, BUN 20, Potassium 3.4, Sodium 140, EGFR 46-53 08/25/2017 Creatinine 1.52, BUN 47, Potassium 3.1, Sodium 137, EGFR 35-41 07/28/2017 Creatinine 1.69, BUN 53, Potassium 3.0, Sodium 139, EGFR 31-36 (low potassium addressed by HF clinic) 06/21/2017 Creatinine 2.06, BUN 38, Potassium 2.5, Sodium 136, EGFR 24-28  Recommendations: No changes.  She attended a [employer] low salt diet.  The [facility] also has exercise classes for members.    Follow-up plan: ICM clinic phone appointment on 12/13/2017 (manual send) to recheck fluid levels.   Office appointment scheduled 12/23/2017 with Rhonda [name]    Copy of ICM check sent to Dr. TLovena Leand Dr Rhonda Lawsfor review and recommendations if needed.   3 month ICM trend: 12/02/2017    1 Year ICM  trend:       [name] RN 12/02/2017 10:14 AM

## 2017-12-03 NOTE — Telephone Encounter (Signed)
Patient states she has not heard from this office and was told that the results were sent, so we should have them.

## 2017-12-06 ENCOUNTER — Telehealth: Payer: Self-pay | Admitting: Neurology

## 2017-12-06 DIAGNOSIS — M1990 Unspecified osteoarthritis, unspecified site: Secondary | ICD-10-CM | POA: Diagnosis not present

## 2017-12-06 DIAGNOSIS — E1142 Type 2 diabetes mellitus with diabetic polyneuropathy: Secondary | ICD-10-CM | POA: Diagnosis not present

## 2017-12-06 DIAGNOSIS — I878 Other specified disorders of veins: Secondary | ICD-10-CM | POA: Diagnosis not present

## 2017-12-06 DIAGNOSIS — I5022 Chronic systolic (congestive) heart failure: Secondary | ICD-10-CM | POA: Diagnosis not present

## 2017-12-06 DIAGNOSIS — D649 Anemia, unspecified: Secondary | ICD-10-CM | POA: Diagnosis not present

## 2017-12-06 DIAGNOSIS — I429 Cardiomyopathy, unspecified: Secondary | ICD-10-CM | POA: Diagnosis not present

## 2017-12-06 DIAGNOSIS — I110 Hypertensive heart disease with heart failure: Secondary | ICD-10-CM | POA: Diagnosis not present

## 2017-12-06 DIAGNOSIS — I951 Orthostatic hypotension: Secondary | ICD-10-CM | POA: Diagnosis not present

## 2017-12-06 DIAGNOSIS — R296 Repeated falls: Secondary | ICD-10-CM | POA: Diagnosis not present

## 2017-12-06 NOTE — Telephone Encounter (Signed)
Patient wants to talk to someone about her falling she fell again and hit the back of her head last week

## 2017-12-06 NOTE — Telephone Encounter (Signed)
[name] I transferred to VM

## 2017-12-06 NOTE — Telephone Encounter (Signed)
Called Pt, LMOVM for return call 

## 2017-12-07 ENCOUNTER — Telehealth: Payer: Self-pay | Admitting: Neurology

## 2017-12-07 NOTE — Telephone Encounter (Signed)
See telephone note on 7/16.

## 2017-12-07 NOTE — Telephone Encounter (Signed)
I put these on your desk.

## 2017-12-07 NOTE — Telephone Encounter (Signed)
Patient called back returning call to the office. Thanks

## 2017-12-07 NOTE — Telephone Encounter (Signed)
I will call patient as soon as Dr. [name] reviews her MRIs.

## 2017-12-07 NOTE — Telephone Encounter (Signed)
Patient given results and instructions.  She will be in tomorrow for lab work and further explanation about the LP.

## 2017-12-07 NOTE — Telephone Encounter (Signed)
CT head wo contrast performed at [facility] 11/24/2017: No acute intracranial abnormality appreciated.  Expanded empty sella may reflect idiopathic intracranial hypertension.  Thick dense calvarium which is of uncertain clinical significance.  Boney sclerosis can be seen with, but not limited to, hematological conditions such as myelofibrosis, metabolic bone disorders such as hyperthyroidism and hyperparathyroidism and other causes.  Correlate clinically.   CT cervical spine wo contrast 11/24/2017: 1.  No acute fracture 2.  No significant spinal canal or foraminal stenosis 3.  Increased sclerosis of the bony structures.  Boney sclerosis can be seen with, but not limited to, hematological conditions such as myelofibrosis, metabolic bone disorders such as hyperthyroidism and hyperparathyroidism and other causes.  Correlate clinically.    I attempted to contact patient via phone today regarding the results of CT head and cervical spine, however there was no answer so a message was left for the patient to return my call.   Caryl Pina, if patient calls back, please inform her that there brain tissue looks good, no signs of stroke, bleed, or mass/tumor.  There is some evidence of possible increased pressure which can be due to increased fluid around the brain, the best way to assess this is with CSF testing via lumbar puncture.  If patient is agreeable, please order LP - high volume tap with opening and closing pressure.   There is no nerve impingement in the neck.  Imaging shows boney changes in the skull as well as the vertebra which needs to be further assessed for conditions which can cause this such as hormonal dysfunction or abnormalities in blood protein levels.  Please order TSH, PTH + calcium, SPEP with IFE, UPEP with IFE.  We will let her know the next step based on these results.  Please also request that she gets a images on CD so I can review.  Shalia Bartko K. [name], DO

## 2017-12-08 ENCOUNTER — Other Ambulatory Visit (INDEPENDENT_AMBULATORY_CARE_PROVIDER_SITE_OTHER): Payer: Medicare HMO

## 2017-12-08 ENCOUNTER — Other Ambulatory Visit: Payer: Self-pay | Admitting: *Deleted

## 2017-12-08 DIAGNOSIS — R51 Headache: Secondary | ICD-10-CM

## 2017-12-08 DIAGNOSIS — R296 Repeated falls: Secondary | ICD-10-CM

## 2017-12-08 DIAGNOSIS — R292 Abnormal reflex: Secondary | ICD-10-CM

## 2017-12-08 DIAGNOSIS — R413 Other amnesia: Secondary | ICD-10-CM

## 2017-12-08 DIAGNOSIS — R519 Headache, unspecified: Secondary | ICD-10-CM

## 2017-12-08 DIAGNOSIS — M542 Cervicalgia: Secondary | ICD-10-CM

## 2017-12-08 LAB — TSH: TSH: 0.14 u[IU]/mL — ABNORMAL LOW (ref 0.35–4.50)

## 2017-12-08 NOTE — Telephone Encounter (Signed)
Patient came in for lab work and I spoke with he,  her husband and daughter explaining the reason for the LP.

## 2017-12-08 NOTE — Telephone Encounter (Signed)
Dineen called to see if you could possible speak with her husband about the results of her CT, she is confused about results.

## 2017-12-09 ENCOUNTER — Other Ambulatory Visit: Payer: Self-pay | Admitting: *Deleted

## 2017-12-09 ENCOUNTER — Telehealth: Payer: Self-pay | Admitting: *Deleted

## 2017-12-09 DIAGNOSIS — M1990 Unspecified osteoarthritis, unspecified site: Secondary | ICD-10-CM | POA: Diagnosis not present

## 2017-12-09 DIAGNOSIS — I5022 Chronic systolic (congestive) heart failure: Secondary | ICD-10-CM | POA: Diagnosis not present

## 2017-12-09 DIAGNOSIS — R296 Repeated falls: Secondary | ICD-10-CM | POA: Diagnosis not present

## 2017-12-09 DIAGNOSIS — R51 Headache: Secondary | ICD-10-CM

## 2017-12-09 DIAGNOSIS — R292 Abnormal reflex: Secondary | ICD-10-CM

## 2017-12-09 DIAGNOSIS — I429 Cardiomyopathy, unspecified: Secondary | ICD-10-CM | POA: Diagnosis not present

## 2017-12-09 DIAGNOSIS — R519 Headache, unspecified: Secondary | ICD-10-CM

## 2017-12-09 DIAGNOSIS — I951 Orthostatic hypotension: Secondary | ICD-10-CM | POA: Diagnosis not present

## 2017-12-09 DIAGNOSIS — E1142 Type 2 diabetes mellitus with diabetic polyneuropathy: Secondary | ICD-10-CM | POA: Diagnosis not present

## 2017-12-09 DIAGNOSIS — M542 Cervicalgia: Secondary | ICD-10-CM

## 2017-12-09 DIAGNOSIS — I878 Other specified disorders of veins: Secondary | ICD-10-CM | POA: Diagnosis not present

## 2017-12-09 DIAGNOSIS — D649 Anemia, unspecified: Secondary | ICD-10-CM | POA: Diagnosis not present

## 2017-12-09 DIAGNOSIS — I110 Hypertensive heart disease with heart failure: Secondary | ICD-10-CM | POA: Diagnosis not present

## 2017-12-09 DIAGNOSIS — R413 Other amnesia: Secondary | ICD-10-CM

## 2017-12-09 LAB — T3, FREE: T3, Free: 2.9 pg/mL (ref 2.3–4.2)

## 2017-12-09 LAB — T4, FREE: Free T4: 1.25 ng/dL (ref 0.60–1.60)

## 2017-12-09 NOTE — Telephone Encounter (Signed)
T3 and T4 added.  The urine without an order was just an extra tube.

## 2017-12-09 NOTE — Telephone Encounter (Signed)
-----   Message from Alda Berthold, DO sent at 12/09/2017  8:37 AM EDT ----- Please call lab and add free T3 and free T4, also follow-up on UPEP as I'm not sure why urine specimen was sent without lab order. Thanks.

## 2017-12-10 ENCOUNTER — Telehealth: Payer: Self-pay | Admitting: Neurology

## 2017-12-10 LAB — IMMUNOFIXATION ELECTROPHORESIS
IgG (Immunoglobin G), Serum: 1006 mg/dL (ref 600–1540)
IgM, Serum: 26 mg/dL — ABNORMAL LOW (ref 50–300)
Immunoglobulin A: 888 mg/dL — ABNORMAL HIGH (ref 20–320)

## 2017-12-10 LAB — PROTEIN ELECTROPHORESIS, SERUM
Albumin ELP: 3.6 g/dL — ABNORMAL LOW (ref 3.8–4.8)
Alpha 1: 0.3 g/dL (ref 0.2–0.3)
Alpha 2: 0.8 g/dL (ref 0.5–0.9)
Beta 2: 0.9 g/dL — ABNORMAL HIGH (ref 0.2–0.5)
Beta Globulin: 0.6 g/dL (ref 0.4–0.6)
Gamma Globulin: 0.9 g/dL (ref 0.8–1.7)
Total Protein: 7.1 g/dL (ref 6.1–8.1)

## 2017-12-10 LAB — PROTEIN ELECTROPHORESIS,RANDOM URN
Creatinine, Urine: 38 mg/dL (ref 20–275)
Protein/Creat Ratio: 105 mg/g creat (ref 21–161)
Total Protein, Urine: 4 mg/dL — ABNORMAL LOW (ref 5–24)

## 2017-12-10 LAB — PTH, INTACT AND CALCIUM
Calcium: 9.2 mg/dL (ref 8.6–10.4)
PTH: 109 pg/mL — ABNORMAL HIGH (ref 14–64)

## 2017-12-10 LAB — IMMUNOFIXATION INTE

## 2017-12-10 LAB — EXTRA URINE SPECIMEN

## 2017-12-10 NOTE — Telephone Encounter (Signed)
Please inform patient that her labs show changes in hormone levels (elevated PTH, normal calcium) that regulates bone mineralization, which may be related to the boney changes seen on her CT head/cervical spine (see prior telephone note).  Refer patient to endocrinology for further evaluation and management.

## 2017-12-13 ENCOUNTER — Ambulatory Visit (INDEPENDENT_AMBULATORY_CARE_PROVIDER_SITE_OTHER): Payer: Medicare HMO

## 2017-12-13 ENCOUNTER — Telehealth: Payer: Self-pay | Admitting: Neurology

## 2017-12-13 ENCOUNTER — Telehealth: Payer: Self-pay

## 2017-12-13 ENCOUNTER — Other Ambulatory Visit: Payer: Self-pay | Admitting: *Deleted

## 2017-12-13 DIAGNOSIS — R7989 Other specified abnormal findings of blood chemistry: Secondary | ICD-10-CM

## 2017-12-13 DIAGNOSIS — Z95810 Presence of automatic (implantable) cardiac defibrillator: Secondary | ICD-10-CM

## 2017-12-13 DIAGNOSIS — I5022 Chronic systolic (congestive) heart failure: Secondary | ICD-10-CM

## 2017-12-13 NOTE — Telephone Encounter (Signed)
Cancelled appointment with Dr. Si Raider.   Instructed patient to take 25 mg of Benadryl prior to MRI per Dr. [name].

## 2017-12-13 NOTE — Telephone Encounter (Signed)
Patient given results and endocrinology referral sent.

## 2017-12-13 NOTE — Progress Notes (Signed)
Phone call

## 2017-12-13 NOTE — Telephone Encounter (Signed)
Patient called and has an appointment for an Imaging appt and she will need something different than a Valium, she said they do not work for her. Also, she is scheduled this week to see Dr. Si Raider, she is needing to know is that something she has to do now or can she put it off a while. Please Call. Thanks

## 2017-12-13 NOTE — Telephone Encounter (Signed)
Spoke with pt and reminded pt of remote transmission that is due today. Pt verbalized understanding.   

## 2017-12-13 NOTE — Addendum Note (Signed)
Addended by: [name] on: 12/13/2017 05:00 PM   Modules accepted: Level of Service

## 2017-12-13 NOTE — Progress Notes (Signed)
EPIC Encounter for ICM Monitoring  Patient Name: Rhonda [name] is a 67 y.o. female Date: 12/13/2017 Primary Care Physican: Maurice Small, MD Primary Cardiologist:Bensimhon Electrophysiologist: Lovena Le Dry Weight: 209lbs Bi-V Pacing: 100%        Heart Failure questions reviewed, pt asymptomatic.   Weight decreased from 214 - 209  lbs   Thoracic impedance abnormal suggesting fluid accumulation.  Torsemide 20 mg3tablets (60 mg total)twice a day.Potassium 20 meq 3tablets (60 mEq total)twice a day. Metolazone 2.63m 1 tablet by mouth if needed for fluid or edema.  Labs: 10/29/2017 Creatinine 1.45, BUN 24, Potassium 3.9, Sodium 141, EGFR 37-43 10/21/2017 Creatinine 1.57, BUN 19, Potassium 3.8, Sodium 140, EGFR 34-39 09/23/2017 Creatinine 1.26, BUN 29, Potassium 3.2, Sodium 142, EGFR 49-56 09/09/2017 Creatinine 1.22, BUN 20, Potassium 3.4, Sodium 140, EGFR 46-53 08/25/2017 Creatinine 1.52, BUN 47, Potassium 3.1, Sodium 137, EGFR 35-41 07/28/2017 Creatinine 1.69, BUN 53, Potassium 3.0, Sodium 139, EGFR 31-36 (low potassium addressed by HF clinic) 06/21/2017 Creatinine 2.06, BUN 38, Potassium 2.5, Sodium 136, EGFR 24-28  Recommendations:   Reinforced sodium restriction to less than 2000 mg daily.  Encouraged to call for fluid symptoms.  Follow-up plan: ICM clinic phone appointment on 01/06/2018.   Office appointment scheduled 12/23/2017 with Dr. BHaroldine Laws    Copy of ICM check sent to Dr. TLovena Leand Dr BHaroldine Lawsto review and will call back if any recommendations.   3 month ICM trend: 12/13/2017    1 Year ICM trend:       [name] RN 12/13/2017 4:45 PM

## 2017-12-14 DIAGNOSIS — I951 Orthostatic hypotension: Secondary | ICD-10-CM | POA: Diagnosis not present

## 2017-12-14 DIAGNOSIS — I878 Other specified disorders of veins: Secondary | ICD-10-CM | POA: Diagnosis not present

## 2017-12-14 DIAGNOSIS — E1142 Type 2 diabetes mellitus with diabetic polyneuropathy: Secondary | ICD-10-CM | POA: Diagnosis not present

## 2017-12-14 DIAGNOSIS — I110 Hypertensive heart disease with heart failure: Secondary | ICD-10-CM | POA: Diagnosis not present

## 2017-12-14 DIAGNOSIS — I5022 Chronic systolic (congestive) heart failure: Secondary | ICD-10-CM | POA: Diagnosis not present

## 2017-12-14 DIAGNOSIS — D649 Anemia, unspecified: Secondary | ICD-10-CM | POA: Diagnosis not present

## 2017-12-14 DIAGNOSIS — R296 Repeated falls: Secondary | ICD-10-CM | POA: Diagnosis not present

## 2017-12-14 DIAGNOSIS — M1990 Unspecified osteoarthritis, unspecified site: Secondary | ICD-10-CM | POA: Diagnosis not present

## 2017-12-14 DIAGNOSIS — I429 Cardiomyopathy, unspecified: Secondary | ICD-10-CM | POA: Diagnosis not present

## 2017-12-16 ENCOUNTER — Encounter: Payer: Medicare HMO | Admitting: Psychology

## 2017-12-16 DIAGNOSIS — D649 Anemia, unspecified: Secondary | ICD-10-CM | POA: Diagnosis not present

## 2017-12-16 DIAGNOSIS — I5022 Chronic systolic (congestive) heart failure: Secondary | ICD-10-CM | POA: Diagnosis not present

## 2017-12-16 DIAGNOSIS — I429 Cardiomyopathy, unspecified: Secondary | ICD-10-CM | POA: Diagnosis not present

## 2017-12-16 DIAGNOSIS — I878 Other specified disorders of veins: Secondary | ICD-10-CM | POA: Diagnosis not present

## 2017-12-16 DIAGNOSIS — I110 Hypertensive heart disease with heart failure: Secondary | ICD-10-CM | POA: Diagnosis not present

## 2017-12-16 DIAGNOSIS — E1142 Type 2 diabetes mellitus with diabetic polyneuropathy: Secondary | ICD-10-CM | POA: Diagnosis not present

## 2017-12-16 DIAGNOSIS — I951 Orthostatic hypotension: Secondary | ICD-10-CM | POA: Diagnosis not present

## 2017-12-16 DIAGNOSIS — R296 Repeated falls: Secondary | ICD-10-CM | POA: Diagnosis not present

## 2017-12-16 DIAGNOSIS — M1990 Unspecified osteoarthritis, unspecified site: Secondary | ICD-10-CM | POA: Diagnosis not present

## 2017-12-17 ENCOUNTER — Telehealth: Payer: Self-pay | Admitting: Neurology

## 2017-12-17 NOTE — Telephone Encounter (Signed)
Called patient and left message that I was checking on her.  Instructed her to go to ER or UC if she needs to over the weekend but otherwise, we will see her on Monday.

## 2017-12-17 NOTE — Telephone Encounter (Signed)
Rhonda [name] called and wanted to let you know thayt she was standing at the sing and fell back and hit her head pretty hard on the floor, but she did not hit any thing else.

## 2017-12-20 ENCOUNTER — Encounter

## 2017-12-20 ENCOUNTER — Ambulatory Visit: Payer: Medicare HMO | Admitting: Neurology

## 2017-12-20 DIAGNOSIS — I429 Cardiomyopathy, unspecified: Secondary | ICD-10-CM | POA: Diagnosis not present

## 2017-12-20 DIAGNOSIS — E1142 Type 2 diabetes mellitus with diabetic polyneuropathy: Secondary | ICD-10-CM | POA: Diagnosis not present

## 2017-12-20 DIAGNOSIS — M1990 Unspecified osteoarthritis, unspecified site: Secondary | ICD-10-CM | POA: Diagnosis not present

## 2017-12-20 DIAGNOSIS — I878 Other specified disorders of veins: Secondary | ICD-10-CM | POA: Diagnosis not present

## 2017-12-20 DIAGNOSIS — I951 Orthostatic hypotension: Secondary | ICD-10-CM | POA: Diagnosis not present

## 2017-12-20 DIAGNOSIS — R296 Repeated falls: Secondary | ICD-10-CM | POA: Diagnosis not present

## 2017-12-20 DIAGNOSIS — I5022 Chronic systolic (congestive) heart failure: Secondary | ICD-10-CM | POA: Diagnosis not present

## 2017-12-20 DIAGNOSIS — D649 Anemia, unspecified: Secondary | ICD-10-CM | POA: Diagnosis not present

## 2017-12-20 DIAGNOSIS — I110 Hypertensive heart disease with heart failure: Secondary | ICD-10-CM | POA: Diagnosis not present

## 2017-12-22 ENCOUNTER — Ambulatory Visit
Admission: RE | Admit: 2017-12-22 | Discharge: 2017-12-22 | Disposition: A | Discharge status: Home / Self Care | Payer: Medicare HMO | Source: Ambulatory Visit | Attending: Neurology | Admitting: Neurology

## 2017-12-22 DIAGNOSIS — R296 Repeated falls: Secondary | ICD-10-CM

## 2017-12-22 DIAGNOSIS — R413 Other amnesia: Secondary | ICD-10-CM | POA: Diagnosis not present

## 2017-12-22 DIAGNOSIS — R2689 Other abnormalities of gait and mobility: Secondary | ICD-10-CM | POA: Diagnosis not present

## 2017-12-22 DIAGNOSIS — M542 Cervicalgia: Secondary | ICD-10-CM

## 2017-12-22 DIAGNOSIS — R292 Abnormal reflex: Secondary | ICD-10-CM

## 2017-12-22 MED ORDER — DIAZEPAM 5 MG PO TABS
5.0000 mg | ORAL_TABLET | Freq: Once | ORAL | Status: AC
Start: 2017-12-22 — End: 2017-12-22
  Administered 2017-12-22: 5 mg via ORAL

## 2017-12-22 NOTE — Discharge Instructions (Signed)

## 2017-12-23 ENCOUNTER — Encounter (HOSPITAL_COMMUNITY): Payer: Medicare HMO | Admitting: Internal Medicine

## 2017-12-23 DIAGNOSIS — I878 Other specified disorders of veins: Secondary | ICD-10-CM | POA: Diagnosis not present

## 2017-12-23 DIAGNOSIS — I5022 Chronic systolic (congestive) heart failure: Secondary | ICD-10-CM | POA: Diagnosis not present

## 2017-12-23 DIAGNOSIS — I429 Cardiomyopathy, unspecified: Secondary | ICD-10-CM | POA: Diagnosis not present

## 2017-12-23 DIAGNOSIS — E1142 Type 2 diabetes mellitus with diabetic polyneuropathy: Secondary | ICD-10-CM | POA: Diagnosis not present

## 2017-12-23 DIAGNOSIS — R296 Repeated falls: Secondary | ICD-10-CM | POA: Diagnosis not present

## 2017-12-23 DIAGNOSIS — D649 Anemia, unspecified: Secondary | ICD-10-CM | POA: Diagnosis not present

## 2017-12-23 DIAGNOSIS — M1990 Unspecified osteoarthritis, unspecified site: Secondary | ICD-10-CM | POA: Diagnosis not present

## 2017-12-23 DIAGNOSIS — I110 Hypertensive heart disease with heart failure: Secondary | ICD-10-CM | POA: Diagnosis not present

## 2017-12-23 DIAGNOSIS — I951 Orthostatic hypotension: Secondary | ICD-10-CM | POA: Diagnosis not present

## 2017-12-24 DIAGNOSIS — I5022 Chronic systolic (congestive) heart failure: Secondary | ICD-10-CM | POA: Diagnosis not present

## 2017-12-24 DIAGNOSIS — M1990 Unspecified osteoarthritis, unspecified site: Secondary | ICD-10-CM | POA: Diagnosis not present

## 2017-12-24 DIAGNOSIS — I110 Hypertensive heart disease with heart failure: Secondary | ICD-10-CM | POA: Diagnosis not present

## 2017-12-24 DIAGNOSIS — E1142 Type 2 diabetes mellitus with diabetic polyneuropathy: Secondary | ICD-10-CM | POA: Diagnosis not present

## 2017-12-24 DIAGNOSIS — I429 Cardiomyopathy, unspecified: Secondary | ICD-10-CM | POA: Diagnosis not present

## 2017-12-24 DIAGNOSIS — I951 Orthostatic hypotension: Secondary | ICD-10-CM | POA: Diagnosis not present

## 2017-12-24 DIAGNOSIS — R296 Repeated falls: Secondary | ICD-10-CM | POA: Diagnosis not present

## 2017-12-24 DIAGNOSIS — I878 Other specified disorders of veins: Secondary | ICD-10-CM | POA: Diagnosis not present

## 2017-12-24 DIAGNOSIS — D649 Anemia, unspecified: Secondary | ICD-10-CM | POA: Diagnosis not present

## 2017-12-27 DIAGNOSIS — M1990 Unspecified osteoarthritis, unspecified site: Secondary | ICD-10-CM | POA: Diagnosis not present

## 2017-12-27 DIAGNOSIS — E1142 Type 2 diabetes mellitus with diabetic polyneuropathy: Secondary | ICD-10-CM | POA: Diagnosis not present

## 2017-12-27 DIAGNOSIS — I951 Orthostatic hypotension: Secondary | ICD-10-CM | POA: Diagnosis not present

## 2017-12-27 DIAGNOSIS — D649 Anemia, unspecified: Secondary | ICD-10-CM | POA: Diagnosis not present

## 2017-12-27 DIAGNOSIS — R296 Repeated falls: Secondary | ICD-10-CM | POA: Diagnosis not present

## 2017-12-27 DIAGNOSIS — I110 Hypertensive heart disease with heart failure: Secondary | ICD-10-CM | POA: Diagnosis not present

## 2017-12-27 DIAGNOSIS — I429 Cardiomyopathy, unspecified: Secondary | ICD-10-CM | POA: Diagnosis not present

## 2017-12-27 DIAGNOSIS — I5022 Chronic systolic (congestive) heart failure: Secondary | ICD-10-CM | POA: Diagnosis not present

## 2017-12-27 DIAGNOSIS — I878 Other specified disorders of veins: Secondary | ICD-10-CM | POA: Diagnosis not present

## 2017-12-29 DIAGNOSIS — I951 Orthostatic hypotension: Secondary | ICD-10-CM | POA: Diagnosis not present

## 2017-12-29 DIAGNOSIS — M1990 Unspecified osteoarthritis, unspecified site: Secondary | ICD-10-CM | POA: Diagnosis not present

## 2017-12-29 DIAGNOSIS — R296 Repeated falls: Secondary | ICD-10-CM | POA: Diagnosis not present

## 2017-12-29 DIAGNOSIS — I429 Cardiomyopathy, unspecified: Secondary | ICD-10-CM | POA: Diagnosis not present

## 2017-12-29 DIAGNOSIS — I5022 Chronic systolic (congestive) heart failure: Secondary | ICD-10-CM | POA: Diagnosis not present

## 2017-12-29 DIAGNOSIS — D649 Anemia, unspecified: Secondary | ICD-10-CM | POA: Diagnosis not present

## 2017-12-29 DIAGNOSIS — I110 Hypertensive heart disease with heart failure: Secondary | ICD-10-CM | POA: Diagnosis not present

## 2017-12-29 DIAGNOSIS — I878 Other specified disorders of veins: Secondary | ICD-10-CM | POA: Diagnosis not present

## 2017-12-29 DIAGNOSIS — E1142 Type 2 diabetes mellitus with diabetic polyneuropathy: Secondary | ICD-10-CM | POA: Diagnosis not present

## 2017-12-30 DIAGNOSIS — I878 Other specified disorders of veins: Secondary | ICD-10-CM | POA: Diagnosis not present

## 2017-12-30 DIAGNOSIS — I429 Cardiomyopathy, unspecified: Secondary | ICD-10-CM | POA: Diagnosis not present

## 2017-12-30 DIAGNOSIS — I951 Orthostatic hypotension: Secondary | ICD-10-CM | POA: Diagnosis not present

## 2017-12-30 DIAGNOSIS — E1142 Type 2 diabetes mellitus with diabetic polyneuropathy: Secondary | ICD-10-CM | POA: Diagnosis not present

## 2017-12-30 DIAGNOSIS — M1990 Unspecified osteoarthritis, unspecified site: Secondary | ICD-10-CM | POA: Diagnosis not present

## 2017-12-30 DIAGNOSIS — I110 Hypertensive heart disease with heart failure: Secondary | ICD-10-CM | POA: Diagnosis not present

## 2017-12-30 DIAGNOSIS — D649 Anemia, unspecified: Secondary | ICD-10-CM | POA: Diagnosis not present

## 2017-12-30 DIAGNOSIS — R296 Repeated falls: Secondary | ICD-10-CM | POA: Diagnosis not present

## 2017-12-30 DIAGNOSIS — I5022 Chronic systolic (congestive) heart failure: Secondary | ICD-10-CM | POA: Diagnosis not present

## 2017-12-31 ENCOUNTER — Telehealth: Payer: Self-pay | Admitting: Neurology

## 2017-12-31 ENCOUNTER — Encounter: Payer: Self-pay | Admitting: Endocrinology

## 2017-12-31 NOTE — Telephone Encounter (Signed)
Patient called wanting to go over her recent MRI Results. Please call her back at [phone]. Thanks.

## 2017-12-31 NOTE — Telephone Encounter (Signed)
Patient has been called by Hinton Dyer to come in on Monday to be seen.  Hinton Dyer left messages for patient to call back.

## 2018-01-03 ENCOUNTER — Encounter: Payer: Self-pay | Admitting: Neurology

## 2018-01-03 ENCOUNTER — Ambulatory Visit: Payer: Medicare HMO | Admitting: Neurology

## 2018-01-03 VITALS — BP 112/64 | HR 86 | Ht 61.0 in | Wt 212.4 lb

## 2018-01-03 DIAGNOSIS — G932 Benign intracranial hypertension: Secondary | ICD-10-CM | POA: Diagnosis not present

## 2018-01-03 DIAGNOSIS — R413 Other amnesia: Secondary | ICD-10-CM

## 2018-01-03 DIAGNOSIS — E1140 Type 2 diabetes mellitus with diabetic neuropathy, unspecified: Secondary | ICD-10-CM | POA: Diagnosis not present

## 2018-01-03 DIAGNOSIS — I951 Orthostatic hypotension: Secondary | ICD-10-CM

## 2018-01-03 MED ORDER — ACETAZOLAMIDE 125 MG PO TABS: 125.0000 mg | ORAL_TABLET | Freq: Every day | ORAL | 2 refills | Status: DC

## 2018-01-03 NOTE — Progress Notes (Signed)
Follow-up Visit   Date: 01/03/18    Rhonda [name] MRN: [id number] DOB: 02-03-1951   Interim History: Rhonda [name] is a 67 y.o.  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  The patient was accompanied to the clinic by husband who also provides collateral information.    History of present illness: She reports having falls since November 2017, some which occurred by tripping over objects and loosing her balance.  Falls become much worse during 2018.  She often gets lightheaded preceding her falls and nearly always falls backwards. She has been diabetic for at least 10 years and has painful neuropathy of the feet, which is well-controlled on gabapentin 300mg  TID.  She takes extra gabapentin 800mg  as needed for severe pain, about once per week.   Over the past several years, she has noticed problems with short-term memory and family get frustrated with her because she is "slow".  She has a housekeeper that cleans her home.  She is able to cook and do laundry.  She manages her own medications and drives, but sometimes feels uncomfortable when driving.  She does not manage her finances.  On one occasion, she forgot how to put her cars into her ignition or to start the wipers.  She is driving and has not been involved in any MVAs.  She manages finances with her husband.  UPDATE 01/03/2018:  She here is for follow-up visit.  She has suffered several falls, all which have occurred in the kitchen when she is standing still while making dinner in the evening.  She has noticed that prolonged standing for 5-10 minutes always triggers her fall which is preceded by lightheadedness. During the day, she is busier and does not stand still for long. She tells me that she is  taking torsemide 60mg  three times daily (prescribed as 60mg  BID), spironolactone 12.5mg  and carvedilol 3.125mg  BID.  She has an appointment with Dr. Haroldine Laws later this month.   At her last visit, I ordered CT cervical spine and brain to evaluate her falls and memory changes.  Imaging showed increased sclerosis of the calvarium and vertebral bodies.  Her PTH was elevated and was referred to endocrinology which is pending.  Findings also showed possible intracranial hypertension and follow-up LP showed mildly raised ICP at 29mm H20.  There has been no change in her gait or falls following the spinal tap.    She continues to have problems with short-term memory, people tends to loose their patience with her because she asks the same questions again. She cancelled her neurocognitive testing due to schedule conflict.   Medications:  Current Outpatient Medications on File Prior to Visit  Medication Sig Dispense Refill  . ALPRAZolam (XANAX) 0.25 MG tablet   0  . ARIPiprazole (ABILIFY) 2 MG tablet Take 2 mg by mouth every morning.     Marland Kitchen BISACODYL 5 MG EC tablet TK UTD  0  . buPROPion (WELLBUTRIN XL) 150 MG 24 hr tablet Take 450 mg by mouth every morning.     . carvedilol (COREG) 3.125 MG tablet TAKE 1 TABLET(3.125 MG) BY MOUTH TWICE DAILY WITH A MEAL 180 tablet 3  . citalopram (CELEXA) 10 MG tablet Take 10 mg by mouth daily.    . clindamycin (CLEOCIN) 300 MG capsule Take 600 mg by mouth  as needed (1 hour prior to dental work).    . clonazePAM (KLONOPIN) 0.5 MG tablet Take 0.5 mg by mouth 2 (two) times daily.     . febuxostat (ULORIC) 40 MG tablet Take 40 mg by mouth at bedtime.     . fexofenadine (ALLEGRA) 180 MG tablet Take 180 mg by mouth 2 (two) times daily.    . fluticasone (FLONASE) 50 MCG/ACT nasal spray INSTILL 2 SPRAYS INTO EACH NOSTRIL ONCE DAILY  7  . gabapentin (NEURONTIN) 300 MG capsule Take 300 mg by mouth 2 (two) times daily.    Marland Kitchen gabapentin (NEURONTIN) 800 MG tablet Take 800 mg by mouth  as needed.    Marland Kitchen ipratropium (ATROVENT) 0.03 % nasal spray INSTILL 2 SPRAYS INTO EACH NOSTRIL TWICE A DAY  10  . levothyroxine (SYNTHROID, LEVOTHROID) 125 MCG tablet Take 125 mcg by mouth daily before breakfast. On an empty stomach    . metolazone (ZAROXOLYN) 2.5 MG tablet take 1 tablet by mouth if needed for FLUID OR EDEMA 30 tablet 2  . montelukast (SINGULAIR) 10 MG tablet Take 10 mg by mouth at bedtime.   1  . potassium chloride SA (K-DUR,KLOR-CON) 20 MEQ tablet Take 3 tablets (60 mEq total) by mouth 2 (two) times daily. 180 tablet 11  . simvastatin (ZOCOR) 10 MG tablet Take 10 mg by mouth every evening.     Marland Kitchen spironolactone (ALDACTONE) 25 MG tablet Take 0.5 tablets (12.5 mg total) by mouth daily. 45 tablet 3  . torsemide (DEMADEX) 20 MG tablet Take 3 tablets (60 mg total) by mouth 2 (two) times daily. 180 tablet 11  . zolpidem (AMBIEN) 10 MG tablet Take 10 mg by mouth at bedtime as needed. For sleep     Current Facility-Administered Medications on File Prior to Visit  Medication Dose Route Frequency Provider Last Rate Last Dose  . triamcinolone acetonide (KENALOG-40) injection 20 mg  20 mg Other Once Landis Martins, DPM        Allergies:  Allergies  Allergen Reactions  . Ceftin Anaphylaxis    Face and throat swell   . Shellfish Allergy Other (See Comments)    Gout exacerbation  . Allopurinol Nausea Only and Other (See Comments)    weakness  . Ativan [Lorazepam] Itching  . Lorazepam Itching  . Sulfa Antibiotics Itching  . Ultram [Tramadol Hcl] Itching    Review of Systems:  CONSTITUTIONAL: No fevers, chills, night sweats, or weight loss.  EYES: No visual changes or eye pain ENT: No hearing changes.  No history of nose bleeds.   RESPIRATORY: No cough, wheezing and shortness of breath.  +lightheadedness CARDIOVASCULAR: Negative for chest pain, and palpitations.   GI: Negative for abdominal discomfort, blood in stools or black stools.  No recent change in bowel habits.   GU:  No  history of incontinence.   MUSCLOSKELETAL: No history of joint pain or swelling.  No myalgias.   SKIN: Negative for lesions, rash, and itching.   ENDOCRINE: Negative for cold or heat intolerance, polydipsia or goiter.   PSYCH:  + depression +anxiety symptoms.   NEURO: As Above.   Vital Signs:  BP 112/64   Pulse 86   Ht 5\' 1"  (1.549 m)   Wt 212 lb 6 oz (96.3 kg)   SpO2 96%   BMI 40.13 kg/m   Orthostatic VS for the past 24 hrs (Last 3 readings):  BP- Lying Pulse- Lying BP- Sitting Pulse- Sitting BP- Standing at 0 minutes Pulse- Standing at 0 minutes  01/03/18 0933 110/70 62 100/60 89 (!) 80/60 95    General Medical Exam:   General:  Well appearing, comfortable  Eyes/ENT: see cranial nerve examination.  Cleft lip.  Neck: No masses appreciated.  Full range of motion without tenderness.  No carotid bruits. Respiratory:  Clear to auscultation, good air entry bilaterally.   Cardiac:  Regular rate and rhythm, no murmur.   Ext:  Bilateral legs are wrapped in [employer] boot   Neurological Exam: MENTAL STATUS including orientation to time, place, person, recent and remote memory, attention span and concentration, language, and fund of knowledge is fairly intact.  Follows commands easily.  Speech is dysarthric (baseline).  Montreal Cognitive Assessment  07/23/2017 07/31/2016  Visuospatial/ Executive (0/5) 4 5  Naming (0/3) 3 3  Attention: Read list of digits (0/2) 2 2  Attention: Read list of letters (0/1) 1 1  Attention: Serial 7 subtraction starting at 100 (0/3) 1 0  Language: Repeat phrase (0/2) 2 2  Language : Fluency (0/1) 1 1  Abstraction (0/2) 2 2  Delayed Recall (0/5) 0 0  Orientation (0/6) 6 6  Total 22 22  Adjusted Score (based on education) 22 22    CRANIAL NERVES:  Pupils are round and reactive to light. Extraocular muscles are intact throughout, except restricted with upgaze.  No ptosis.  Face is symmetric.   MOTOR:  Motor strength is 5/5 in all extremities. No pronator  drift.  Tone is normal.    MSRs:  Reflexes are 3+/4 throughout and absent at the Achilles.   COORDINATION/GAIT:  Finger to nose testing is intact. There is mild bradykinesia finger tapping.  Gait is slightly-wide based, unassisted.   Data: Labs 07/31/2016:  Vitamin B1 11, vitamin B12 597, copper 162, SPEP with IFE no M protein  CT head wo contrast performed at [facility] 11/24/2017: No acute intracranial abnormality appreciated.  Expanded empty sella may reflect idiopathic intracranial hypertension.  Thick dense calvarium which is of uncertain clinical significance.  Boney sclerosis can be seen with, but not limited to, hematological conditions such as myelofibrosis, metabolic bone disorders such as hyperthyroidism and hyperparathyroidism and other causes.  Correlate clinically.   CT cervical spine wo contrast 11/24/2017: 1.  No acute fracture 2.  No significant spinal canal or foraminal stenosis 3.  Increased sclerosis of the bony structures.  Boney sclerosis can be seen with, but not limited to, hematological conditions such as myelofibrosis, metabolic bone disorders such as hyperthyroidism and hyperparathyroidism and other causes.  Correlate clinically.    IMPRESSION/PLAN: 1. Gait abnormality and falls due to orthostatic hypotension and sensory ataxia   - Orthostatic vital signs continue to show marked drop from 110/70 >> 80/60 with appropriate HR response  - She takes coreg 3.125mg  BID, spironolactone 12.5mg , and tosemide 60mg  TID - very likely that her torsemide is contributing to symptoms. Balancing her heart failure and management of orthostasis is challenging.  If diuretics cannot be lowered, may need to add low dose midodrine or simply request patient to start using a wheelchair to avoid falls  - In the meantime, I have instructed her to avoid prolonged standing, prepare meals while sitting   - Patient educated to make positional changes very slowly  2. Intracranial hypertension with  OP 26cm H20   - Unable to get MRI due to ICD  - Start low dose diamox 125mg  daily  3. Boney sclerosis of unclear etiology.  PTH is elevated so she has been referred to endocrinology.  Will request  PCP to look into any possible other causes for this.  SPEP with IFE showed polyclonal light chains, no M protein.  4. 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  She also takes gabapentin 800mg  as needed for severe pain  5. Cognitive impairment with memory loss over the past several years. 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    - Formal neurocognitive testing   Return to clinic after testing  Greater than 50% of this 45 minute visit was spent in counseling, explanation of diagnosis, planning of further management, and coordination of care.   Thank you for allowing me to participate in patient's care.  If I can answer any additional questions, I would be pleased to do so.    Sincerely,    Elyce Zollinger K. [name], DO

## 2018-01-03 NOTE — Patient Instructions (Addendum)
Start diamox 125mg  daily.  If you develop itching, stop medication and take benadryl  Reduce gabapentin to 300mg  twice daily  Formal neurocognitive testing  Referral to endocrinology

## 2018-01-04 MED ORDER — MIDODRINE HCL 5 MG PO TABS: 5.0000 mg | ORAL_TABLET | Freq: Three times a day (TID) | ORAL | 5 refills | Status: DC

## 2018-01-04 NOTE — Progress Notes (Signed)
Addendum  Plan to start midodrine 5mg  three times daily.  Hold if SBP > 160.   Jahari Wiginton K. [name], DO

## 2018-01-04 NOTE — Progress Notes (Signed)
Patient given information and instructions.

## 2018-01-04 NOTE — Addendum Note (Signed)
Addended by: Alda Berthold on: 01/04/2018 10:29 AM   Modules accepted: Orders

## 2018-01-06 ENCOUNTER — Telehealth: Payer: Self-pay | Admitting: Neurology

## 2018-01-06 ENCOUNTER — Telehealth: Payer: Self-pay

## 2018-01-06 ENCOUNTER — Ambulatory Visit (INDEPENDENT_AMBULATORY_CARE_PROVIDER_SITE_OTHER): Payer: Medicare HMO | Admitting: *Deleted

## 2018-01-06 ENCOUNTER — Ambulatory Visit (INDEPENDENT_AMBULATORY_CARE_PROVIDER_SITE_OTHER): Payer: Medicare HMO

## 2018-01-06 DIAGNOSIS — Z95810 Presence of automatic (implantable) cardiac defibrillator: Secondary | ICD-10-CM

## 2018-01-06 DIAGNOSIS — I951 Orthostatic hypotension: Secondary | ICD-10-CM | POA: Diagnosis not present

## 2018-01-06 DIAGNOSIS — I110 Hypertensive heart disease with heart failure: Secondary | ICD-10-CM | POA: Diagnosis not present

## 2018-01-06 DIAGNOSIS — D649 Anemia, unspecified: Secondary | ICD-10-CM | POA: Diagnosis not present

## 2018-01-06 DIAGNOSIS — I5022 Chronic systolic (congestive) heart failure: Secondary | ICD-10-CM

## 2018-01-06 DIAGNOSIS — I429 Cardiomyopathy, unspecified: Secondary | ICD-10-CM | POA: Diagnosis not present

## 2018-01-06 DIAGNOSIS — I878 Other specified disorders of veins: Secondary | ICD-10-CM | POA: Diagnosis not present

## 2018-01-06 DIAGNOSIS — E1142 Type 2 diabetes mellitus with diabetic polyneuropathy: Secondary | ICD-10-CM | POA: Diagnosis not present

## 2018-01-06 DIAGNOSIS — I428 Other cardiomyopathies: Secondary | ICD-10-CM

## 2018-01-06 DIAGNOSIS — M1990 Unspecified osteoarthritis, unspecified site: Secondary | ICD-10-CM | POA: Diagnosis not present

## 2018-01-06 DIAGNOSIS — R296 Repeated falls: Secondary | ICD-10-CM | POA: Diagnosis not present

## 2018-01-06 NOTE — Telephone Encounter (Signed)
Remote ICM transmission received.  Attempted call to patient and left message, per DPR, to return call.  

## 2018-01-06 NOTE — Telephone Encounter (Signed)
Patient called stating she was put on two new medications and she is now having headaches. They are the acetazolamide 125mg  and midodrine 2.5mg  She didn't know if this was a general side effect. Please call her at [phone]. She has some other questions regarding her medication. Thanks.

## 2018-01-06 NOTE — Progress Notes (Signed)
EPIC Encounter for ICM Monitoring  Patient Name: Rhonda [name] is a 67 y.o. female Date: 01/06/2018 Primary Care Physican: Webb, Carol, MD Primary Cardiologist:Bensimhon Electrophysiologist: Taylor Dry Weight:  Previous weight 209lbs Bi-V Pacing: 100%      Attempted call to patient and unable to reach.  Left message to return call.  Transmission reviewed.    Thoracic impedance remains abnormal suggesting fluid accumulation.  Prescribed dosage: Torsemide 20 mg3tablets (60 mg total)twice a day.Potassium 20 meq 3tablets (60 mEq total)twice a day. Metolazone 2.5mg 1 tablet by mouth if needed for fluid or edema.  Labs: 10/29/2017 Creatinine 1.45, BUN 24, Potassium 3.9, Sodium 141, EGFR 37-43 10/21/2017 Creatinine 1.57, BUN 19, Potassium 3.8, Sodium 140, EGFR 34-39 09/23/2017 Creatinine 1.26, BUN 29, Potassium 3.2, Sodium 142, EGFR 49-56 09/09/2017 Creatinine 1.22, BUN 20, Potassium 3.4, Sodium 140, EGFR 46-53 08/25/2017 Creatinine 1.52, BUN 47, Potassium 3.1, Sodium 137, EGFR 35-41 07/28/2017 Creatinine 1.69, BUN 53, Potassium 3.0, Sodium 139, EGFR 31-36 (low potassium addressed by HF clinic) 06/21/2017 Creatinine 2.06, BUN 38, Potassium 2.5, Sodium 136, EGFR 24-28  Recommendations: NONE - Unable to reach.  Follow-up plan: ICM clinic phone appointment on 01/17/2018 to recheck fluid levels.    Copy of ICM check sent to Dr. Bensimhon and Dr Taylor.   3 month ICM trend: 01/06/2018    1 Year ICM trend:        S , RN 01/06/2018 12:16 PM   

## 2018-01-06 NOTE — Progress Notes (Signed)
Remote ICD transmission.   

## 2018-01-07 DIAGNOSIS — M1990 Unspecified osteoarthritis, unspecified site: Secondary | ICD-10-CM | POA: Diagnosis not present

## 2018-01-07 DIAGNOSIS — I951 Orthostatic hypotension: Secondary | ICD-10-CM | POA: Diagnosis not present

## 2018-01-07 DIAGNOSIS — I429 Cardiomyopathy, unspecified: Secondary | ICD-10-CM | POA: Diagnosis not present

## 2018-01-07 DIAGNOSIS — D649 Anemia, unspecified: Secondary | ICD-10-CM | POA: Diagnosis not present

## 2018-01-07 DIAGNOSIS — I110 Hypertensive heart disease with heart failure: Secondary | ICD-10-CM | POA: Diagnosis not present

## 2018-01-07 DIAGNOSIS — I5022 Chronic systolic (congestive) heart failure: Secondary | ICD-10-CM | POA: Diagnosis not present

## 2018-01-07 DIAGNOSIS — E1142 Type 2 diabetes mellitus with diabetic polyneuropathy: Secondary | ICD-10-CM | POA: Diagnosis not present

## 2018-01-07 DIAGNOSIS — I878 Other specified disorders of veins: Secondary | ICD-10-CM | POA: Diagnosis not present

## 2018-01-07 DIAGNOSIS — R296 Repeated falls: Secondary | ICD-10-CM | POA: Diagnosis not present

## 2018-01-07 NOTE — Telephone Encounter (Signed)
Called patient back and she said she is not having headaches now.

## 2018-01-10 ENCOUNTER — Encounter: Payer: Medicare HMO | Admitting: Psychology

## 2018-01-12 DIAGNOSIS — M1990 Unspecified osteoarthritis, unspecified site: Secondary | ICD-10-CM | POA: Diagnosis not present

## 2018-01-12 DIAGNOSIS — E1142 Type 2 diabetes mellitus with diabetic polyneuropathy: Secondary | ICD-10-CM | POA: Diagnosis not present

## 2018-01-12 DIAGNOSIS — I878 Other specified disorders of veins: Secondary | ICD-10-CM | POA: Diagnosis not present

## 2018-01-12 DIAGNOSIS — I951 Orthostatic hypotension: Secondary | ICD-10-CM | POA: Diagnosis not present

## 2018-01-12 DIAGNOSIS — I429 Cardiomyopathy, unspecified: Secondary | ICD-10-CM | POA: Diagnosis not present

## 2018-01-12 DIAGNOSIS — R296 Repeated falls: Secondary | ICD-10-CM | POA: Diagnosis not present

## 2018-01-12 DIAGNOSIS — I110 Hypertensive heart disease with heart failure: Secondary | ICD-10-CM | POA: Diagnosis not present

## 2018-01-12 DIAGNOSIS — D649 Anemia, unspecified: Secondary | ICD-10-CM | POA: Diagnosis not present

## 2018-01-12 DIAGNOSIS — I5022 Chronic systolic (congestive) heart failure: Secondary | ICD-10-CM | POA: Diagnosis not present

## 2018-01-13 DIAGNOSIS — E1142 Type 2 diabetes mellitus with diabetic polyneuropathy: Secondary | ICD-10-CM | POA: Diagnosis not present

## 2018-01-13 DIAGNOSIS — I429 Cardiomyopathy, unspecified: Secondary | ICD-10-CM | POA: Diagnosis not present

## 2018-01-13 DIAGNOSIS — R296 Repeated falls: Secondary | ICD-10-CM | POA: Diagnosis not present

## 2018-01-13 DIAGNOSIS — I878 Other specified disorders of veins: Secondary | ICD-10-CM | POA: Diagnosis not present

## 2018-01-13 DIAGNOSIS — I110 Hypertensive heart disease with heart failure: Secondary | ICD-10-CM | POA: Diagnosis not present

## 2018-01-13 DIAGNOSIS — D649 Anemia, unspecified: Secondary | ICD-10-CM | POA: Diagnosis not present

## 2018-01-13 DIAGNOSIS — I5022 Chronic systolic (congestive) heart failure: Secondary | ICD-10-CM | POA: Diagnosis not present

## 2018-01-13 DIAGNOSIS — I951 Orthostatic hypotension: Secondary | ICD-10-CM | POA: Diagnosis not present

## 2018-01-13 DIAGNOSIS — M1990 Unspecified osteoarthritis, unspecified site: Secondary | ICD-10-CM | POA: Diagnosis not present

## 2018-01-17 ENCOUNTER — Ambulatory Visit (INDEPENDENT_AMBULATORY_CARE_PROVIDER_SITE_OTHER): Payer: Medicare HMO

## 2018-01-17 DIAGNOSIS — Z95810 Presence of automatic (implantable) cardiac defibrillator: Secondary | ICD-10-CM

## 2018-01-17 DIAGNOSIS — I5022 Chronic systolic (congestive) heart failure: Secondary | ICD-10-CM

## 2018-01-17 NOTE — Progress Notes (Signed)
EPIC Encounter for ICM Monitoring  Patient Name: Rhonda [name] is a 67 y.o. female Date: 01/17/2018 Primary Care Physican: Maurice Small, MD Primary Cardiologist:Bensimhon Electrophysiologist: Lovena Le Dry Weight: 203.7lbs Bi-V Pacing: 100%        Heart Failure questions reviewed, pt asymptomatic.  She does have some dizziness with falls, she is following up with neuro.  Legs are wrapped and she reported swelling has decreased.  Patient reports she is getting confused at time with her meds and needs help filling her pill boxes.  Message sent to HF clinic to evaluate it patient is eligible for HF community paramedicine program to assist with medications.    Thoracic impedance improved since last ICM transmission 8/15 but continues to be abnormal suggesting fluid accumulation.  Prescribed dosage: Torsemide 20 mg3tablets (60 mg total)twice a day.Potassium 20 meq 3tablets (60 mEq total)twice a day. Metolazone 2.60m 1 tablet by mouth if needed for fluid or edema.  Labs: 10/29/2017 Creatinine 1.45, BUN 24, Potassium 3.9, Sodium 141, EGFR 37-43 10/21/2017 Creatinine 1.57, BUN 19, Potassium 3.8, Sodium 140, EGFR 34-39 09/23/2017 Creatinine 1.26, BUN 29, Potassium 3.2, Sodium 142, EGFR 49-56 09/09/2017 Creatinine 1.22, BUN 20, Potassium 3.4, Sodium 140, EGFR 46-53 08/25/2017 Creatinine 1.52, BUN 47, Potassium 3.1, Sodium 137, EGFR 35-41 07/28/2017 Creatinine 1.69, BUN 53, Potassium 3.0, Sodium 139, EGFR 31-36 (low potassium addressed by HF clinic) 06/21/2017 Creatinine 2.06, BUN 38, Potassium 2.5, Sodium 136, EGFR 24-28  Recommendations: No changes.   Encouraged to call for fluid symptoms.  Follow-up plan: ICM clinic phone appointment on 02/14/2018.   Office appointment scheduled 01/20/2018 with Dr. BHaroldine Laws    Copy of ICM check sent to Dr. TLovena Leand Dr BHaroldine Laws   3 month ICM trend: 01/17/2018    1 Year ICM trend:       [name] RN 01/17/2018 12:25 PM

## 2018-01-19 DIAGNOSIS — M1990 Unspecified osteoarthritis, unspecified site: Secondary | ICD-10-CM | POA: Diagnosis not present

## 2018-01-19 DIAGNOSIS — I110 Hypertensive heart disease with heart failure: Secondary | ICD-10-CM | POA: Diagnosis not present

## 2018-01-19 DIAGNOSIS — R296 Repeated falls: Secondary | ICD-10-CM | POA: Diagnosis not present

## 2018-01-19 DIAGNOSIS — I951 Orthostatic hypotension: Secondary | ICD-10-CM | POA: Diagnosis not present

## 2018-01-19 DIAGNOSIS — E1142 Type 2 diabetes mellitus with diabetic polyneuropathy: Secondary | ICD-10-CM | POA: Diagnosis not present

## 2018-01-19 DIAGNOSIS — D649 Anemia, unspecified: Secondary | ICD-10-CM | POA: Diagnosis not present

## 2018-01-19 DIAGNOSIS — I429 Cardiomyopathy, unspecified: Secondary | ICD-10-CM | POA: Diagnosis not present

## 2018-01-19 DIAGNOSIS — I878 Other specified disorders of veins: Secondary | ICD-10-CM | POA: Diagnosis not present

## 2018-01-19 DIAGNOSIS — I5022 Chronic systolic (congestive) heart failure: Secondary | ICD-10-CM | POA: Diagnosis not present

## 2018-01-20 ENCOUNTER — Ambulatory Visit (HOSPITAL_COMMUNITY)
Admission: RE | Admit: 2018-01-20 | Discharge: 2018-01-20 | Disposition: A | Discharge status: Home / Self Care | Payer: Medicare HMO | Source: Ambulatory Visit | Attending: Cardiology | Admitting: Cardiology

## 2018-01-20 ENCOUNTER — Encounter (HOSPITAL_COMMUNITY): Payer: Self-pay | Admitting: Internal Medicine

## 2018-01-20 VITALS — BP 120/62 | HR 79 | Wt 208.8 lb

## 2018-01-20 DIAGNOSIS — I5032 Chronic diastolic (congestive) heart failure: Secondary | ICD-10-CM | POA: Diagnosis not present

## 2018-01-20 DIAGNOSIS — Z79890 Hormone replacement therapy (postmenopausal): Secondary | ICD-10-CM | POA: Insufficient documentation

## 2018-01-20 DIAGNOSIS — J45909 Unspecified asthma, uncomplicated: Secondary | ICD-10-CM | POA: Diagnosis not present

## 2018-01-20 DIAGNOSIS — I878 Other specified disorders of veins: Secondary | ICD-10-CM | POA: Insufficient documentation

## 2018-01-20 DIAGNOSIS — I951 Orthostatic hypotension: Secondary | ICD-10-CM | POA: Insufficient documentation

## 2018-01-20 DIAGNOSIS — Z09 Encounter for follow-up examination after completed treatment for conditions other than malignant neoplasm: Secondary | ICD-10-CM | POA: Insufficient documentation

## 2018-01-20 DIAGNOSIS — J9610 Chronic respiratory failure, unspecified whether with hypoxia or hypercapnia: Secondary | ICD-10-CM | POA: Insufficient documentation

## 2018-01-20 DIAGNOSIS — I5022 Chronic systolic (congestive) heart failure: Secondary | ICD-10-CM | POA: Diagnosis not present

## 2018-01-20 DIAGNOSIS — F329 Major depressive disorder, single episode, unspecified: Secondary | ICD-10-CM | POA: Insufficient documentation

## 2018-01-20 DIAGNOSIS — E785 Hyperlipidemia, unspecified: Secondary | ICD-10-CM | POA: Insufficient documentation

## 2018-01-20 DIAGNOSIS — Z9981 Dependence on supplemental oxygen: Secondary | ICD-10-CM | POA: Insufficient documentation

## 2018-01-20 DIAGNOSIS — Z79899 Other long term (current) drug therapy: Secondary | ICD-10-CM | POA: Insufficient documentation

## 2018-01-20 DIAGNOSIS — M109 Gout, unspecified: Secondary | ICD-10-CM | POA: Diagnosis not present

## 2018-01-20 DIAGNOSIS — F419 Anxiety disorder, unspecified: Secondary | ICD-10-CM | POA: Insufficient documentation

## 2018-01-20 DIAGNOSIS — E039 Hypothyroidism, unspecified: Secondary | ICD-10-CM | POA: Insufficient documentation

## 2018-01-20 DIAGNOSIS — E1140 Type 2 diabetes mellitus with diabetic neuropathy, unspecified: Secondary | ICD-10-CM | POA: Diagnosis not present

## 2018-01-20 DIAGNOSIS — Z6839 Body mass index (BMI) 39.0-39.9, adult: Secondary | ICD-10-CM | POA: Insufficient documentation

## 2018-01-20 DIAGNOSIS — I110 Hypertensive heart disease with heart failure: Secondary | ICD-10-CM | POA: Insufficient documentation

## 2018-01-20 MED ORDER — TORSEMIDE 20 MG PO TABS: 60.0000 mg | ORAL_TABLET | Freq: Two times a day (BID) | ORAL | 3 refills | Status: DC

## 2018-01-20 MED ORDER — MIDODRINE HCL 10 MG PO TABS: 10.0000 mg | ORAL_TABLET | Freq: Three times a day (TID) | ORAL | 3 refills | Status: DC

## 2018-01-20 NOTE — Patient Instructions (Signed)
Increase Midodrine to 10 mg Three times a day   Decrease Torsemide to 60 mg (3 tabs) Twice daily   You have been referred to Paramedicine, the paramedic will call you and schedule a time to come out to your home  Your physician recommends that you schedule a follow-up appointment in: 2-3 months

## 2018-01-20 NOTE — Progress Notes (Signed)
ADVANCED HF CLINIC NOTE  Patient ID: Rhonda [name], female   DOB: 1951-03-02, 67 y.o.   MRN: [id number]  Primary Cardiologist: Dr Radford Pax  General Surgeon: Dr Donne Hazel  Orthopedic: Dr Alvan Dame  PCP: Dr Justin Mend Referring MD: Dr Radford Pax  HPI: Rhonda [name] is a 67 y.o. 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 She has had sleep study with no  evidence of sleep apnea.  She has a Medtronic CRT-D device.  Last echo in 7/18 showed recovery of EF to 55-60%.   Today she returns for HF follow up. Last visit found to be taking extra torsemide and was having falls. Dr [name] with neuro has started her on midodrine with orthostasis. Also started her on diamox for elevated ICP. She is still taking 60 mg torsemide TID. She is falling 1-3 times/week. It occurs after standing for about 5 minutes. She then gets dizzy and feels like her mind is "going to sleep" and then falls. She hits her head most times. She gets SOB walking short distances. No SOB with ADLs, but goes slow. Has BLE edema with unna boots on. No orthopnea, PND. No CP. Poor appetite. Very fatigued. [facility] RN following, done with PT. Taking all medications, but sometimes gets confused. Weights: Down 8 lbs in 2 weeks. Limits salt intake. Drinks about 2 L of fluid + ice.  Optivol: thoracic impedence below threshold. No VT/VF. No AF/AT. Active 1 hour/day.   10/14/12 ECHO EF 35% RV ok  04/12/13 ECHO EF 20-25%, LV moderately dilated 10/11/2013 ECHO EF 45-50% 7/18 ECHO EF 55-60%, normal RV size and systolic function, PASP 34 mmHg  11/24/12 Creatinine 1.23 Potassium 4.1 12/15/12 Creatinine 2.03 Potassium 3.3  12/22/12 Creatinine 1.2  Potassium 3.8 02/23/13 Creatinine 1.68 K 4.4  05/02/13 K 4.2 Creatinine 1.8  10/11/13 K 3.7 Creatinine 1.69 10/17 K 4.1, creatinine 1.35 09/2017: K3.8 Creatinine 1.57  Review of systems complete and found to be negative unless listed in HPI.   Past Medical History:  Diagnosis Date  . Anemia   . Anxiety   . Arthritis    RIGHT KNEE ARTHRITIS AND PAIN-PT TOLD BONE ON BONE  . Asthma   . Back pain    DISK PROBLEM  . Breast cancer, stage 1 ([place]) 03/26/2011   left-FINISHED CHEMO  AND RADIATION  . Cardiomyopathy    [name]  . Chronic systolic heart failure (HCC)    a) NICM b) ECHO (03/2013) EF 20-25% c) ECHO (09/2013) EF 45-50%, grade I DD  . Complication of anesthesia    PT STATES HER B/P LOW AFTER ONE OF HER SURGERIES--SHE ATTRIBUTES TO LYING FLAT  . Depression   . Diabetes mellitus    "diet controlled" (05/04/2103)  . Exertional shortness of breath   . GERD (gastroesophageal reflux disease)   . Gout   . Heart murmur   . Hyperlipidemia   . Hypertension   . Hypothyroidism   . Left bundle branch block    s/p CRT-D (04/2013)  . Migraines   . On home oxygen therapy    "2L suppose to be q night" (05/03/2013)  . Peripheral neuropathy    feet  . SVD (spontaneous vaginal  delivery)    x 2  . Unspecified vitamin D deficiency 03/26/2011   does not take meds    Current Outpatient Medications  Medication Sig Dispense Refill  . acetaZOLAMIDE (DIAMOX) 125 MG tablet Take 1 tablet (125 mg total) by mouth daily. 30 tablet 2  . ALPRAZolam (XANAX) 0.25 MG tablet   0  . ARIPiprazole (ABILIFY) 2 MG tablet Take 2 mg by mouth every morning.     Marland Kitchen BISACODYL 5 MG EC tablet TK UTD  0  . buPROPion (WELLBUTRIN XL) 150 MG 24 hr tablet Take 450 mg by mouth every morning.     . carvedilol (COREG) 3.125 MG tablet TAKE 1 TABLET(3.125 MG) BY MOUTH TWICE DAILY WITH A MEAL 180 tablet 3  . citalopram (CELEXA) 10 MG tablet Take 10 mg by mouth daily.    . clonazePAM (KLONOPIN) 0.5  MG tablet Take 0.5 mg by mouth 2 (two) times daily.     . febuxostat (ULORIC) 40 MG tablet Take 40 mg by mouth at bedtime.     . fexofenadine (ALLEGRA) 180 MG tablet Take 180 mg by mouth 2 (two) times daily.    . fluticasone (FLONASE) 50 MCG/ACT nasal spray INSTILL 2 SPRAYS INTO EACH NOSTRIL ONCE DAILY  7  . gabapentin (NEURONTIN) 300 MG capsule Take 300 mg by mouth 2 (two) times daily.    Marland Kitchen gabapentin (NEURONTIN) 800 MG tablet Take 800 mg by mouth as needed.    Marland Kitchen ipratropium (ATROVENT) 0.03 % nasal spray INSTILL 2 SPRAYS INTO EACH NOSTRIL TWICE A DAY  10  . levothyroxine (SYNTHROID, LEVOTHROID) 125 MCG tablet Take 125 mcg by mouth daily before breakfast. On an empty stomach    . midodrine (PROAMATINE) 5 MG tablet Take 1 tablet (5 mg total) by mouth 3 (three) times daily with meals. 90 tablet 5  . montelukast (SINGULAIR) 10 MG tablet Take 10 mg by mouth at bedtime.   1  . potassium chloride SA (K-DUR,KLOR-CON) 20 MEQ tablet Take 3 tablets (60 mEq total) by mouth 2 (two) times daily. 180 tablet 11  . simvastatin (ZOCOR) 10 MG tablet Take 10 mg by mouth every evening.     Marland Kitchen spironolactone (ALDACTONE) 25 MG tablet Take 0.5 tablets (12.5 mg total) by mouth daily. 45 tablet 3  . torsemide (DEMADEX) 20 MG tablet Take 3 tablets (60 mg total) by mouth 2 (two) times daily. 180 tablet 11  . zolpidem (AMBIEN) 10 MG tablet Take 10 mg by mouth at bedtime as needed. For sleep    . clindamycin (CLEOCIN) 300 MG capsule Take 600 mg by mouth as needed (1 hour prior to dental work).    . metolazone (ZAROXOLYN) 2.5 MG tablet take 1 tablet by mouth if needed for FLUID OR EDEMA (Patient not taking: Reported on 01/20/2018) 30 tablet 2   Current Facility-Administered Medications  Medication Dose Route Frequency Provider Last Rate Last Dose  . triamcinolone acetonide (KENALOG-40) injection 20 mg  20 mg Other Once Landis Martins, DPM        Vitals:   01/20/18 1341  BP: 120/62  Pulse: 79  SpO2: 96%  Weight: 94.7 kg  (208 lb 12.8 oz)   Orthostatics: Sitting: 128/80 Standing: 110/78  Wt Readings from Last 3 Encounters:  01/20/18 94.7 kg (208 lb 12.8 oz)  01/03/18 96.3 kg (212 lb 6 oz)  10/29/17 100 kg (220 lb 8 oz)    PHYSICAL EXAM: General: Obese woman sitting on exam table. No resp difficulty. HEENT: Normal with  upper lip scar Neck: Supple. JVP 5-6. Carotids 2+ bilat; no bruits. No thyromegaly or nodule noted. Cor: PMI nondisplaced. RRR, No M/G/R noted Lungs: CTAB, normal effort. No wheeze Abdomen: Obese Soft, non-tender, non-distended, no HSM. No bruits or masses. +BS  Extremities: No cyanosis, clubbing, or rash. R and LLE no edema. BLE unna boots  Neuro: Alert & orientedx3, cranial nerves grossly intact. moves all 4 extremities w/o difficulty. Affect pleasant   ASSESSMENT & PLAN:  1) Chronic systolic HF: NICM s/p Medtronic CRT-D, ?Viral myocarditis. Cardiomyopathy dates from 48. EF 45-50% (09/2013), EF up to 55-60% on 7/18 echo.  PYP 09/02/17 negative for TTR (Grade 0-1, H/CCL 1.2). SPEP with no M-spike.  - NYHA III. Volume status stable to dry on exam. - Decrease torsemide to 60 mg BID (Has been taking TID) - Continue coreg 3.125 mg BID. Consider stopping next visit if still orthostatic.  - Continue spiro 12.5 mg daily.  - Continue unna boots through [facility] - Refer to paramedicine. Discussed with HF navigator. - 2) Obesity - Body mass index is 39.45 kg/m.  - Encouraged weight loss.  3) HTN -Stable.  4) Orthostatic Hypotension.  - Neurology following. Gabapentin decreased. - Now on midodrine 5 mg TID.  - She is still orthostatic today. Increase midodrine to 10 mg TID. May need to stop coreg next visit.  5) Chronic Venous stasis - Continue compression wraps.  6) Falls  - Followed by Dr [name].  Increase midodrine to 10 mg TID Decrease torsemide 60 mg BID Refer to paramedicine Follow up 2-3 months  Georgiana Shore NP-C  1:50 PM   Patient seen and examined with the above-signed  Advanced Practice Provider and/or Housestaff. I personally reviewed laboratory data, imaging studies and relevant notes. I independently examined the patient and formulated the important aspects of the plan. I have edited the note to reflect any of my changes or salient points. I have personally discussed the plan with the patient and/or family.  Overall stable from HF perspective. EF has recovered. Volume status looks MUCH better and perhaps a bit on the dry side with orthostasis on exam. Cut torsemide back to 60 bid (was on 60 tid). Can take extra as needed. Check labs. Will have paramedicine follow. Given ongoing orthostasis will increase midodrine to 10 tid for now. Suspect she may have autonomic neuropathy. W/u for TTR amyloid has been negative. Continue to follow with Dr. [name] in Neuro.   Glori Bickers, MD  10:39 PM

## 2018-01-25 ENCOUNTER — Telehealth (HOSPITAL_COMMUNITY): Payer: Self-pay | Admitting: Surgery

## 2018-01-25 NOTE — Telephone Encounter (Signed)
Patient has been referred to the HF [employer].  I have sent all appropriate paperwork via secure email to the Paramedic team.

## 2018-01-26 DIAGNOSIS — J329 Chronic sinusitis, unspecified: Secondary | ICD-10-CM | POA: Diagnosis not present

## 2018-01-27 DIAGNOSIS — I878 Other specified disorders of veins: Secondary | ICD-10-CM | POA: Diagnosis not present

## 2018-01-27 DIAGNOSIS — E1142 Type 2 diabetes mellitus with diabetic polyneuropathy: Secondary | ICD-10-CM | POA: Diagnosis not present

## 2018-01-27 DIAGNOSIS — R296 Repeated falls: Secondary | ICD-10-CM | POA: Diagnosis not present

## 2018-01-27 DIAGNOSIS — I5022 Chronic systolic (congestive) heart failure: Secondary | ICD-10-CM | POA: Diagnosis not present

## 2018-01-27 DIAGNOSIS — I110 Hypertensive heart disease with heart failure: Secondary | ICD-10-CM | POA: Diagnosis not present

## 2018-01-27 DIAGNOSIS — I951 Orthostatic hypotension: Secondary | ICD-10-CM | POA: Diagnosis not present

## 2018-01-27 DIAGNOSIS — I429 Cardiomyopathy, unspecified: Secondary | ICD-10-CM | POA: Diagnosis not present

## 2018-01-27 DIAGNOSIS — D649 Anemia, unspecified: Secondary | ICD-10-CM | POA: Diagnosis not present

## 2018-01-27 DIAGNOSIS — M1990 Unspecified osteoarthritis, unspecified site: Secondary | ICD-10-CM | POA: Diagnosis not present

## 2018-01-31 ENCOUNTER — Telehealth (HOSPITAL_COMMUNITY): Payer: Self-pay

## 2018-01-31 NOTE — Telephone Encounter (Signed)
I called pt this morning to sch a visit, pt called me just now to cancel/resch due to her having her little grandkids there and she doesn't want to try to have visit with them there. So we resch it for wed morning when the kids should be with their mom.   [name], EMT-Paramedic  01/31/18

## 2018-02-02 ENCOUNTER — Other Ambulatory Visit (HOSPITAL_COMMUNITY): Payer: Self-pay

## 2018-02-02 DIAGNOSIS — R296 Repeated falls: Secondary | ICD-10-CM | POA: Diagnosis not present

## 2018-02-02 DIAGNOSIS — D649 Anemia, unspecified: Secondary | ICD-10-CM | POA: Diagnosis not present

## 2018-02-02 DIAGNOSIS — M1990 Unspecified osteoarthritis, unspecified site: Secondary | ICD-10-CM | POA: Diagnosis not present

## 2018-02-02 DIAGNOSIS — E1142 Type 2 diabetes mellitus with diabetic polyneuropathy: Secondary | ICD-10-CM | POA: Diagnosis not present

## 2018-02-02 DIAGNOSIS — I429 Cardiomyopathy, unspecified: Secondary | ICD-10-CM | POA: Diagnosis not present

## 2018-02-02 DIAGNOSIS — I5022 Chronic systolic (congestive) heart failure: Secondary | ICD-10-CM | POA: Diagnosis not present

## 2018-02-02 DIAGNOSIS — I951 Orthostatic hypotension: Secondary | ICD-10-CM | POA: Diagnosis not present

## 2018-02-02 DIAGNOSIS — I878 Other specified disorders of veins: Secondary | ICD-10-CM | POA: Diagnosis not present

## 2018-02-02 DIAGNOSIS — I110 Hypertensive heart disease with heart failure: Secondary | ICD-10-CM | POA: Diagnosis not present

## 2018-02-02 NOTE — Progress Notes (Signed)
Paramedicine Encounter    Patient ID: Rhonda [name], female    DOB: 12/04/1950, 67 y.o.   MRN: [id number]   Patient Care Team: Maurice Small, MD as PCP - General (Family Medicine)  Patient Active Problem List   Diagnosis Date Noted  . Orthostatic hypotension 07/28/2017  . SVD (spontaneous vaginal delivery)   . Peripheral neuropathy   . On home oxygen therapy   . Migraines   . Left bundle branch block   . Hypothyroidism   . Hypertension   . Hyperlipidemia   . Heart murmur   . GERD (gastroesophageal reflux disease)   . Exertional shortness of breath   . Depression   . Complication of anesthesia   . Back pain   . Arthritis   . Anxiety   . Anemia   . HTN (hypertension) 09/14/2013  . Chronic respiratory failure ([place]) 09/14/2013  . 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 Morbid obesity ([place]) 11/01/2012  . Chronic systolic heart failure ([place]) 10/27/2012  . S/P right TKA 05/10/2012  . Endometrial polyp 01/20/2012  . Breast cancer, stage 1 ([place]) 03/26/2011  . Unspecified vitamin D deficiency 03/26/2011    Current Outpatient Medications:  .  acetaZOLAMIDE (DIAMOX) 125 MG tablet, Take 1 tablet (125 mg total) by mouth daily., Disp: 30 tablet, Rfl: 2 .  buPROPion (WELLBUTRIN XL) 150 MG 24 hr tablet, Take 450 mg by mouth every morning. , Disp: , Rfl:  .  carvedilol (COREG) 3.125 MG tablet, TAKE 1 TABLET(3.125 MG) BY MOUTH TWICE DAILY WITH A MEAL, Disp: 180 tablet, Rfl: 3 .  citalopram (CELEXA) 10 MG tablet, Take 10 mg by mouth daily. , Disp: , Rfl:  .  clonazePAM (KLONOPIN) 0.5 MG tablet, Take 0.5 mg by mouth 2 (two) times daily. , Disp: , Rfl:  .  fexofenadine (ALLEGRA) 180 MG tablet, Take 180 mg by mouth 2 (two) times daily., Disp: , Rfl:  .  fluticasone (FLONASE) 50 MCG/ACT nasal spray, INSTILL 2 SPRAYS INTO EACH NOSTRIL ONCE DAILY, Disp: , Rfl: 7 .  gabapentin (NEURONTIN) 300 MG capsule, Take 300 mg by mouth 2 (two) times daily., Disp: ,  Rfl:  .  gabapentin (NEURONTIN) 800 MG tablet, Take 800 mg by mouth as needed., Disp: , Rfl:  .  ipratropium (ATROVENT) 0.03 % nasal spray, INSTILL 2 SPRAYS INTO EACH NOSTRIL TWICE A DAY, Disp: , Rfl: 10 .  levothyroxine (SYNTHROID, LEVOTHROID) 125 MCG tablet, Take 125 mcg by mouth daily before breakfast. On an empty stomach, Disp: , Rfl:  .  midodrine (PROAMATINE) 10 MG tablet, Take 1 tablet (10 mg total) by mouth 3 (three) times daily with meals., Disp: 90 tablet, Rfl: 3 .  montelukast (SINGULAIR) 10 MG tablet, Take 10 mg by mouth at bedtime. , Disp: , Rfl: 1 .  potassium chloride SA (K-DUR,KLOR-CON) 20 MEQ tablet, Take 3 tablets (60 mEq total) by mouth 2 (two) times daily., Disp: 180 tablet, Rfl: 11 .  simvastatin (ZOCOR) 10 MG tablet, Take 10 mg by mouth every evening. , Disp: , Rfl:  .  spironolactone (ALDACTONE) 25 MG tablet, Take 0.5 tablets (12.5 mg total) by mouth daily., Disp: 45 tablet, Rfl: 3 .  torsemide (DEMADEX) 20 MG tablet, Take 3 tablets (60 mg total) by mouth 2 (two) times daily., Disp: 180 tablet, Rfl: 3 .  zolpidem (AMBIEN) 10 MG tablet, Take 10 mg by mouth at bedtime as needed. For sleep, Disp: , Rfl:  .  ALPRAZolam (XANAX) 0.25 MG tablet, ,  Disp: , Rfl: 0 .  ARIPiprazole (ABILIFY) 2 MG tablet, Take 2 mg by mouth every morning. , Disp: , Rfl:  .  BISACODYL 5 MG EC tablet, TK UTD, Disp: , Rfl: 0 .  clindamycin (CLEOCIN) 300 MG capsule, Take 600 mg by mouth as needed (1 hour prior to dental work)., Disp: , Rfl:  .  febuxostat (ULORIC) 40 MG tablet, Take 40 mg by mouth at bedtime. , Disp: , Rfl:  .  metolazone (ZAROXOLYN) 2.5 MG tablet, take 1 tablet by mouth if needed for FLUID OR EDEMA (Patient not taking: Reported on 01/20/2018), Disp: 30 tablet, Rfl: 2  Current Facility-Administered Medications:  .  triamcinolone acetonide (KENALOG-40) injection 20 mg, 20 mg, Other, Once, Landis Martins, DPM Allergies  Allergen Reactions  . Ceftin Anaphylaxis    Face and throat swell    . Shellfish Allergy Other (See Comments)    Gout exacerbation  . Allopurinol Nausea Only and Other (See Comments)    weakness  . Ativan [Lorazepam] Itching  . Lorazepam Itching  . Sulfa Antibiotics Itching  . Ultram [Tramadol Hcl] Itching      Social History   Socioeconomic History  . Marital status: Married    Spouse name: Not on file  . Number of children: 2  . Years of education: masters  . Highest education level: Not on file  Occupational History  . Not on file  Social Needs  . Financial resource strain: Not on file  . Food insecurity:    Worry: Not on file    Inability: Not on file  . Transportation needs:    Medical: Not on file    Non-medical: Not on file  Tobacco Use  . Smoking status: Former Smoker    Packs/day: 0.10    Years: 26.00    Pack years: 2.60    Types: Cigarettes    Last attempt to quit: 05/06/2007    Years since quitting: 10.7  . Smokeless tobacco: Never Used  Substance and Sexual Activity  . Alcohol use: No  . Drug use: No  . Sexual activity: Yes  Lifestyle  . Physical activity:    Days per week: Not on file    Minutes per session: Not on file  . Stress: Not on file  Relationships  . Social connections:    Talks on phone: Not on file    Gets together: Not on file    Attends religious service: Not on file    Active member of club or organization: Not on file    Attends meetings of clubs or organizations: Not on file    Relationship status: Not on file  . Intimate partner violence:    Fear of current or ex partner: Not on file    Emotionally abused: Not on file    Physically abused: Not on file    Forced sexual activity: Not on file  Other Topics Concern  . Not on file  Social History Narrative   Tobacco Use Cigarettes: Former Smoker, Quit in 2008   No Alcohol   No recreational drug use   Diet: Regular/Low Carb   Exercise: None   Occupation: disabled   Education: Research officer, political party, masters   Children: 2   Firearms: No   Associate Professor Use: Always   Former Metallurgist.        Physical Exam      Future Appointments  Date Time Provider [place]  02/10/2018 11:15 AM Philemon Kingdom, MD [facility] None  02/14/2018  6:15 PM [facility] DEVICE REMOTES [facility]  03/04/2018 11:00 AM Narda Amber K, DO [facility] None  03/16/2018  3:30 PM Marzetta Board, MD [facility]  03/30/2018  1:30 PM [facility] PA/NP [facility] None  04/07/2018  8:05 AM [facility] DEVICE REMOTES [facility]  07/14/2018  9:00 AM Kandis Nab, PsyD [facility] None  07/14/2018 10:00 AM [facility] TECH [facility] None  08/01/2018 10:00 AM Kandis Nab, PsyD [facility] None    BP 120/74   Pulse 72   Resp 14   Wt 203 lb (92.1 kg)   BMI 38.36 kg/m   Weight yesterday-205-day before  Last visit weight-208  First visit with pt, looking through chart she is making errors with her medications and has multiple falls. She states it may be her b/p causing her to fall.  Pt not using walker or cane while walking. She does get sob upon some exertion, goes away with rest.  Advanced home health nurse arrived during our visit, she has been getting unna boots done, nurse said today would have been her last day coming but now pt does not have anyone to take over wrapping her legs.she will get her legs wrapped again on Friday.  Nurse is going to check to see if more orders have been placed by clinic for longer time.  Pt reports she has poor appetite due to her sinusitis.  She goes to [facility], husband takes her to appointments. Pt states she does good with low sodium diet but her fluid intake is over limit as she has dry mouth often.  Pt states she needs help with her meds, reviewed meds with her. She needs to order the acetazolamide, she takes 20mg  of the celexa, she states she does not take the uloric. Called pharmacy to order the diamox which they are working on it and the abilify is not on  file at [facility].  Pill box refilled with pt sitting beside of me to watch so she can do it herself.   [name], [place] [facility] Paramedic  02/02/18

## 2018-02-08 ENCOUNTER — Telehealth (HOSPITAL_COMMUNITY): Payer: Self-pay

## 2018-02-08 NOTE — Telephone Encounter (Signed)
Pt called to schedule [facility] visit for this week; a family member answered and stated that she was at a doctors appointment.  She took my number down and stated that she will have pt call back.

## 2018-02-08 NOTE — Telephone Encounter (Signed)
Pt called back and we agreed to visit tomorrow 02/09/18 Wednesday @ 11am.

## 2018-02-09 ENCOUNTER — Other Ambulatory Visit (HOSPITAL_COMMUNITY): Payer: Self-pay

## 2018-02-09 ENCOUNTER — Telehealth (HOSPITAL_COMMUNITY): Payer: Self-pay

## 2018-02-09 NOTE — Telephone Encounter (Signed)
Pt called to confirm our appointment and let her know that I was enroute.  Pt forgot the time; we agreed for me to come over around 12

## 2018-02-09 NOTE — Progress Notes (Signed)
Paramedicine Encounter    Patient ID: Rhonda [name], female    DOB: 05-Apr-1951, 67 y.o.   MRN: [id number]    Patient Care Team: Maurice Small, MD as PCP - General (Family Medicine)  Patient Active Problem List   Diagnosis Date Noted  . Orthostatic hypotension 07/28/2017  . SVD (spontaneous vaginal delivery)   . Peripheral neuropathy   . On home oxygen therapy   . Migraines   . Left bundle branch block   . Hypothyroidism   . Hypertension   . Hyperlipidemia   . Heart murmur   . GERD (gastroesophageal reflux disease)   . Exertional shortness of breath   . Depression   . Complication of anesthesia   . Back pain   . Arthritis   . Anxiety   . Anemia   . HTN (hypertension) 09/14/2013  . Chronic respiratory failure ([place]) 09/14/2013  . 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 Morbid obesity ([place]) 11/01/2012  . Chronic systolic heart failure ([place]) 10/27/2012  . S/P right TKA 05/10/2012  . Endometrial polyp 01/20/2012  . Breast cancer, stage 1 ([place]) 03/26/2011  . Unspecified vitamin D deficiency 03/26/2011    Current Outpatient Medications:  .  acetaZOLAMIDE (DIAMOX) 125 MG tablet, Take 1 tablet (125 mg total) by mouth daily., Disp: 30 tablet, Rfl: 2 .  buPROPion (WELLBUTRIN XL) 150 MG 24 hr tablet, Take 450 mg by mouth every morning. , Disp: , Rfl:  .  carvedilol (COREG) 3.125 MG tablet, TAKE 1 TABLET(3.125 MG) BY MOUTH TWICE DAILY WITH A MEAL, Disp: 180 tablet, Rfl: 3 .  citalopram (CELEXA) 10 MG tablet, Take 10 mg by mouth daily. , Disp: , Rfl:  .  clonazePAM (KLONOPIN) 0.5 MG tablet, Take 0.5 mg by mouth 2 (two) times daily. , Disp: , Rfl:  .  fexofenadine (ALLEGRA) 180 MG tablet, Take 180 mg by mouth 2 (two) times daily., Disp: , Rfl:  .  gabapentin (NEURONTIN) 300 MG capsule, Take 300 mg by mouth 2 (two) times daily., Disp: , Rfl:  .  gabapentin (NEURONTIN) 800 MG tablet, Take 800 mg by mouth as needed., Disp: , Rfl:  .  levothyroxine  (SYNTHROID, LEVOTHROID) 125 MCG tablet, Take 125 mcg by mouth daily before breakfast. On an empty stomach, Disp: , Rfl:  .  midodrine (PROAMATINE) 10 MG tablet, Take 1 tablet (10 mg total) by mouth 3 (three) times daily with meals., Disp: 90 tablet, Rfl: 3 .  montelukast (SINGULAIR) 10 MG tablet, Take 10 mg by mouth at bedtime. , Disp: , Rfl: 1 .  potassium chloride SA (K-DUR,KLOR-CON) 20 MEQ tablet, Take 3 tablets (60 mEq total) by mouth 2 (two) times daily., Disp: 180 tablet, Rfl: 11 .  simvastatin (ZOCOR) 10 MG tablet, Take 10 mg by mouth every evening. , Disp: , Rfl:  .  spironolactone (ALDACTONE) 25 MG tablet, Take 0.5 tablets (12.5 mg total) by mouth daily., Disp: 45 tablet, Rfl: 3 .  torsemide (DEMADEX) 20 MG tablet, Take 3 tablets (60 mg total) by mouth 2 (two) times daily., Disp: 180 tablet, Rfl: 3 .  zolpidem (AMBIEN) 10 MG tablet, Take 10 mg by mouth at bedtime as needed. For sleep, Disp: , Rfl:  .  ALPRAZolam (XANAX) 0.25 MG tablet, , Disp: , Rfl: 0 .  ARIPiprazole (ABILIFY) 2 MG tablet, Take 2 mg by mouth every morning. , Disp: , Rfl:  .  BISACODYL 5 MG EC tablet, TK UTD, Disp: , Rfl: 0 .  clindamycin (  CLEOCIN) 300 MG capsule, Take 600 mg by mouth as needed (1 hour prior to dental work)., Disp: , Rfl:  .  febuxostat (ULORIC) 40 MG tablet, Take 40 mg by mouth at bedtime. , Disp: , Rfl:  .  fluticasone (FLONASE) 50 MCG/ACT nasal spray, INSTILL 2 SPRAYS INTO EACH NOSTRIL ONCE DAILY, Disp: , Rfl: 7 .  ipratropium (ATROVENT) 0.03 % nasal spray, INSTILL 2 SPRAYS INTO EACH NOSTRIL TWICE A DAY, Disp: , Rfl: 10 .  metolazone (ZAROXOLYN) 2.5 MG tablet, take 1 tablet by mouth if needed for FLUID OR EDEMA (Patient not taking: Reported on 01/20/2018), Disp: 30 tablet, Rfl: 2  Current Facility-Administered Medications:  .  triamcinolone acetonide (KENALOG-40) injection 20 mg, 20 mg, Other, Once, Landis Martins, DPM Allergies  Allergen Reactions  . Ceftin Anaphylaxis    Face and throat swell   .  Shellfish Allergy Other (See Comments)    Gout exacerbation  . Allopurinol Nausea Only and Other (See Comments)    weakness  . Ativan [Lorazepam] Itching  . Lorazepam Itching  . Sulfa Antibiotics Itching  . Ultram [Tramadol Hcl] Itching     Social History   Socioeconomic History  . Marital status: Married    Spouse name: Not on file  . Number of children: 2  . Years of education: masters  . Highest education level: Not on file  Occupational History  . Not on file  Social Needs  . Financial resource strain: Not on file  . Food insecurity:    Worry: Not on file    Inability: Not on file  . Transportation needs:    Medical: Not on file    Non-medical: Not on file  Tobacco Use  . Smoking status: Former Smoker    Packs/day: 0.10    Years: 26.00    Pack years: 2.60    Types: Cigarettes    Last attempt to quit: 05/06/2007    Years since quitting: 10.7  . Smokeless tobacco: Never Used  Substance and Sexual Activity  . Alcohol use: No  . Drug use: No  . Sexual activity: Yes  Lifestyle  . Physical activity:    Days per week: Not on file    Minutes per session: Not on file  . Stress: Not on file  Relationships  . Social connections:    Talks on phone: Not on file    Gets together: Not on file    Attends religious service: Not on file    Active member of club or organization: Not on file    Attends meetings of clubs or organizations: Not on file    Relationship status: Not on file  . Intimate partner violence:    Fear of current or ex partner: Not on file    Emotionally abused: Not on file    Physically abused: Not on file    Forced sexual activity: Not on file  Other Topics Concern  . Not on file  Social History Narrative   Tobacco Use Cigarettes: Former Smoker, Quit in 2008   No Alcohol   No recreational drug use   Diet: Regular/Low Carb   Exercise: None   Occupation: disabled   Education: Research officer, political party, masters   Children: 2   Firearms: No   Therapist, art  Use: Always   Former Metallurgist.        Physical Exam  Pulmonary/Chest: Effort normal. No respiratory distress. She has no wheezes. She has no rales.  Abdominal: Soft. She exhibits no distension.  There is no tenderness. There is no guarding.  Musculoskeletal: She exhibits edema.  Skin: Skin is warm and dry. She is not diaphoretic.     Community Paramedic Living Environment - 02/09/18 1300      Outside of House   Sidewalk and pathway to house is level and free from any hazards  Yes    Driveway is free from debris/snow/ice  Yes    Outside stairs are stable and have sturdy handrail  Yes    Porch lights are working and provide adequate lighting  Yes      Living Room   Furniture is of adequate height and offers arm rests that assist in getting up and down  Yes    Floor is free from any clutter that would create tripping hazards  Yes    All cords are either behind furniture or secured in a manner that does not cause trip hazards  Yes    All rugs are secured to floor with double-sided tape  Yes    Lighting is adequate to light room  Yes    All lighting has an easily accessible on/off switch  Yes    Phone is readily accessible near favorite seating areas  Yes    Emergency numbers are printed near all phones in house  Yes      Kitchen   Items used most often are within easy reach on low shelves  Yes    Step stool is present, is sturdy and has a handrail  N/A    Floor mats are non-slip tread and secured to floor  Yes    Oven controls are within easy reach  Yes    Kitchen lighting is adequate and easy to reach switches  Yes    [employer] fire extinguisher is located in kitchen  --   unknown     [employer] is properly secured to stairs and/or all wood is properly secured  Yes    Handrail is present and sturdy  Yes    Stairs are free from any clutter  Yes    Stairway is adequately lit  Yes      Bathroom   Tub and shower have a non-slip surface  No    Tub and/or shower have a grab  bar for stability  Yes    Toilet has a raised seat  Yes    Grab bar is attached near toilet for assistance  Yes    Pathway from bedroom to bathroom is free from clutter and well lit for ease of movement in the middle of the night  Yes      Bedroom   Floor is free from clutter  Yes    Light is near bed and is easy to turn on  Yes    Phone is next to bed and within easy reach  Yes    Flashlight is near bed in case of emergency  Yes      General   Smoke detectors in all areas of the house (each floor) and tested  Yes    CO detectors on each floor of house and tested  Yes    Flashlights are handy throughout the home  No    Resident has all medical information readily available and in an area emergency providers will easily find  Yes    All heaters are away from any type of flammable material  Yes      Overall Tips   Homeowner ha good  non-skid shoes to move around house  Yes    All assisted walking devices are readily accessible and in good condition  Yes    There is a phone near the floor for ease of reach in case of a fall  YES    All O2 tubing is less than 50 ft. and is not a trip hazard  No    Resident has had an annual hearing and vision check by a physician  Yes    Resident has the proper hearing and visual aids prescribed and are in good working order  N/A    All medications are properly stored and labeled to avoid confusion on dosage, time to take, and avoidance of missed doses  Yes        Future Appointments  Date Time Provider [place]  02/10/2018 11:15 AM Philemon Kingdom, MD [facility] None  02/14/2018  6:15 PM [facility] DEVICE REMOTES [facility]  03/04/2018 11:00 AM Narda Amber K, DO [facility] None  03/16/2018  3:30 PM Marzetta Board, MD [facility]  03/30/2018  1:30 PM [facility] PA/NP [facility] None  04/07/2018  8:05 AM [facility] DEVICE REMOTES [facility]  07/14/2018  9:00 AM Kandis Nab, PsyD [facility] None   07/14/2018 10:00 AM [facility] None  08/01/2018 10:00 AM Kandis Nab, PsyD [facility] None     BP (!) 142/90   Pulse 72   Resp 16   Wt 197 lb (89.4 kg)   SpO2 95%   BMI 37.22 kg/m    Weight yesterday-weights aren't written in the right places Last visit weight-203  ATF pt CAO x4 standing in the kitchen; her husband is present during this visit.  Katie made initial pt contact with pt last week.  Pt denies dizziness, sob and chest pain at this time; she stated that she does has "dizzy spells which makes her unsteady on her feet".  Pt admits to falling due to the same in the past. Pt stated that she has fluid on the brain which is a new diagnosis for her.  She stated that she's having memory issues and she's concerned about dementia.    Pt stated that her husband attends all of her doctors appointments and writes down any information that is given during the visit.  He's her main support person.    Pt has all of her medications; pill box that katie filled had pills mixed up.  I'm unsure if the pills had been spilled or not.  We went over her pill box; rx bottles that she's currently taking is in the red heart failure bag and the rest left in plastic container.  rx bottles verified and pill box refilled.   **finished amox-clav upcoming sunday Medication ordered: none  Catherin Doorn, EMT Paramedic [phone] 02/09/2018    ACTION: Home visit completed

## 2018-02-10 ENCOUNTER — Ambulatory Visit (INDEPENDENT_AMBULATORY_CARE_PROVIDER_SITE_OTHER): Payer: Medicare HMO | Admitting: Internal Medicine

## 2018-02-10 ENCOUNTER — Encounter: Payer: Self-pay | Admitting: Internal Medicine

## 2018-02-10 VITALS — BP 108/70 | HR 72 | Ht 60.0 in | Wt 202.0 lb

## 2018-02-10 DIAGNOSIS — E213 Hyperparathyroidism, unspecified: Secondary | ICD-10-CM | POA: Diagnosis not present

## 2018-02-10 DIAGNOSIS — E039 Hypothyroidism, unspecified: Secondary | ICD-10-CM

## 2018-02-10 LAB — BASIC METABOLIC PANEL WITH GFR
BUN/Creatinine Ratio: 22 (calc) (ref 6–22)
BUN: 41 mg/dL — ABNORMAL HIGH (ref 7–25)
CO2: 25 mmol/L (ref 20–32)
Calcium: 9.4 mg/dL (ref 8.6–10.4)
Chloride: 100 mmol/L (ref 98–110)
Creat: 1.85 mg/dL — ABNORMAL HIGH (ref 0.50–0.99)
GFR, Est African American: 33 mL/min/{1.73_m2} — ABNORMAL LOW (ref >=60)
GFR, Est Non African American: 28 mL/min/{1.73_m2} — ABNORMAL LOW (ref >=60)
Glucose, Bld: 96 mg/dL (ref 65–99)
Potassium: 3.9 mmol/L (ref 3.5–5.3)
Sodium: 138 mmol/L (ref 135–146)

## 2018-02-10 LAB — VITAMIN D 25 HYDROXY (VIT D DEFICIENCY, FRACTURES): VITD: 39.48 ng/mL (ref 30.00–100.00)

## 2018-02-10 LAB — T4, FREE: Free T4: 1.1 ng/dL (ref 0.60–1.60)

## 2018-02-10 LAB — TSH: TSH: 1.2 u[IU]/mL (ref 0.35–4.50)

## 2018-02-10 NOTE — Progress Notes (Signed)
Patient ID: Rhonda [name], female   DOB: Oct 31, 1950, 67 y.o.   MRN: [id number]    HPI  Rhonda [name] is a 67 y.o.-year-old female, referred by her neurologist, Dr. [name], for management of hypothyroidism and hyperparathyroidism.  Pt. has been dx with hypothyroidism ~ 2009 >> on Levothyroxine 125 mcg.  She takes the thyroid hormone: - rarely skips doses - sometimes falls asleep while eating dinner. - 10 pm, ~1h after dinner  - with water, tea, ginger ale - no calcium, iron, PPIs - + multivitamins in am  I reviewed pt's thyroid tests: Lab Results  Component Value Date   TSH 0.14 (L) 12/08/2017   TSH 3.69 08/05/2009   TSH 0.372  11/02/2008   TSH 0.781 09/16/2008   FREET4 1.25 12/09/2017   T3FREE 2.9 12/09/2017   Antithyroid antibodies: No results found for: THGAB No components found for: TPOAB  Pt describes: - + weight loss recently  - 30 lbs after cutting down portions - + fatigue - + cold intolerance - no depression - no constipation - no dry skin - + hair loss  Pt denies feeling nodules in neck, hoarseness, dysphagia/odynophagia, SOB with lying down.  She has no FH of thyroid disorders. No FH of thyroid cancer.  No h/o radiation tx to head or neck. No recent use of iodine supplements. No recent steroids. No herbal medicines.  Pt. also has a history of hyperparathyroidism, without an elevated calcium in the setting of CKD:  Lab Results  Component Value Date   PTH 109 (H) 12/08/2017   CALCIUM 9.2 12/08/2017   CALCIUM 9.1 10/29/2017   CALCIUM 8.8 (L) 10/21/2017   CALCIUM 9.2 09/23/2017   CALCIUM 8.6 (L) 09/09/2017   CALCIUM 9.3 08/25/2017   CALCIUM 8.9 07/28/2017   CALCIUM 9.0 06/21/2017   CALCIUM 8.9 12/17/2016   CALCIUM 9.3 03/02/2016   No h/o vit D deficiency. Lab Results  Component Value Date   VD25OH 49 07/16/2011  On a MVI, no separate vitamin D supplement.  She has CKD: Lab Results  Component Value Date   BUN 24 (H) 10/29/2017   Lab Results   Component Value Date   CREATININE 1.45 (H) 10/29/2017   No history of kidney stones or osteoporosis.  She has a history of breast cancer.  ROS: Constitutional: + see HPI Eyes: no blurry vision, no xerophthalmia ENT: no sore throat, + see above Cardiovascular: no CP/+ SOB/no palpitations/+ leg swelling Respiratory: + Cough, + shortness of breath, + wheezing Gastrointestinal: no N/V/D/C Musculoskeletal: no muscle/+ joint aches Skin: no rashes, + hair loss Neurological: no tremors/numbness/tingling/dizziness Psychiatric: no depression/anxiety  Past Medical History:  Diagnosis Date  . Anemia   . Anxiety   . Arthritis    RIGHT KNEE ARTHRITIS AND PAIN-PT TOLD BONE ON BONE  . Asthma   . Back pain    DISK PROBLEM  . Breast cancer, stage 1 ([place]) 03/26/2011   left-FINISHED CHEMO  AND RADIATION  . Cardiomyopathy    [name]  . Chronic systolic heart failure (HCC)    a) NICM b) ECHO (03/2013) EF 20-25% c) ECHO (09/2013) EF 45-50%, grade I DD  . Complication of anesthesia    PT STATES HER B/P LOW AFTER ONE OF HER SURGERIES--SHE ATTRIBUTES TO LYING FLAT  . Depression   . Diabetes mellitus    "diet controlled" (05/04/2103)  . Exertional shortness of breath   . GERD (gastroesophageal reflux disease)   . Gout   .  Heart murmur   . Hyperlipidemia   . Hypertension   . Hypothyroidism   . Left bundle branch block    s/p CRT-D (04/2013)  . Migraines   . On home oxygen therapy    "2L suppose to be q night" (05/03/2013)  . Peripheral neuropathy    feet  . SVD (spontaneous vaginal delivery)    x 2  . Unspecified vitamin D deficiency 03/26/2011   does not take meds   Past Surgical History:  Procedure Laterality Date  . BI-VENTRICULAR IMPLANTABLE CARDIOVERTER DEFIBRILLATOR N/A 05/03/2013   Procedure: BI-VENTRICULAR IMPLANTABLE CARDIOVERTER DEFIBRILLATOR  (CRT-D);  Surgeon: Evans Lance, MD;  Location: [facility] C[facility];  Service: Cardiovascular;  Laterality:  N/A;  . BI-VENTRICULAR IMPLANTABLE CARDIOVERTER DEFIBRILLATOR  (CRT-D)  05/03/2013   MDT CRTD implanted by Dr Lovena Le for non ischemic cardiomyopathy  . BREAST LUMPECTOMY Left 07/28/2010  . CARDIAC CATHETERIZATION  2010   NORMAL CORONARY ARTERIES  . 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--11 SURGERIES     PT HAS REMOVABLE SPEECH BULB-TAKES IT OUT BEFORE HER SURGERY  . COLONOSCOPY    . HYSTEROSCOPY W/D&C  01/20/2012   Procedure: DILATATION AND CURETTAGE /HYSTEROSCOPY;  Surgeon: Margarette Asal, MD;  Location: [facility];  Service: Gynecology;  Laterality: N/A;  with trueclear  . PORT-A-CATH REMOVAL  09/23/2011   Procedure: REMOVAL PORT-A-CATH;  Surgeon: Rolm Bookbinder, MD;  Location: [facility];  Service: General;  Laterality: N/A;  Port Removal  . PORTACATH PLACEMENT  2012  . TONSILLECTOMY  1960's  . TOTAL KNEE ARTHROPLASTY  05/10/2012   Procedure: TOTAL KNEE ARTHROPLASTY;  Surgeon: Mauri Pole, MD;  Location: [facility];  Service: Orthopedics;  Laterality: Right;  . WISDOM TOOTH EXTRACTION     Social History   Socioeconomic History  . Marital status: Married    Spouse name: Not on file  . Number of children: 2  . Years of education: masters  . Highest education level: Not on file  Occupational History  . Not on file  Social Needs  . Financial resource strain: Not on file  . Food insecurity:    Worry: Not on file    Inability: Not on file  . Transportation needs:    Medical: Not on file    Non-medical: Not on file  Tobacco Use  . Smoking status: Former Smoker    Packs/day: 0.10    Years: 26.00    Pack years: 2.60    Types: Cigarettes    Last attempt to quit: 05/06/2007    Years since quitting: 10.7  . Smokeless tobacco: Never Used  Substance and Sexual Activity  . Alcohol use: No  . Drug use: No  . Sexual activity: Yes  Lifestyle  . Physical activity:    Days per week: Not on file    Minutes per session: Not on file  . Stress: Not on file  Relationships  . Social connections:     Talks on phone: Not on file    Gets together: Not on file    Attends religious service: Not on file    Active member of club or organization: Not on file    Attends meetings of clubs or organizations: Not on file    Relationship status: Not on file  . Intimate partner violence:    Fear of current or ex partner: Not on file    Emotionally abused: Not on file    Physically abused: Not on file    Forced sexual activity: Not  on file  Other Topics Concern  . Not on file  Social History Narrative   Tobacco Use Cigarettes: Former Smoker, Quit in 2008   No Alcohol   No recreational drug use   Diet: Regular/Low Carb   Exercise: None   Occupation: disabled   Education: Research officer, political party, masters   Children: 2   Firearms: No   Therapist, art Use: Always   Former Metallurgist.       Current Outpatient Medications on File Prior to Visit  Medication Sig Dispense Refill  . acetaZOLAMIDE (DIAMOX) 125 MG tablet Take 1 tablet (125 mg total) by mouth daily. 30 tablet 2  . ALPRAZolam (XANAX) 0.25 MG tablet   0  . ARIPiprazole (ABILIFY) 2 MG tablet Take 2 mg by mouth every morning.     Marland Kitchen BISACODYL 5 MG EC tablet TK UTD  0  . buPROPion (WELLBUTRIN XL) 150 MG 24 hr tablet Take 450 mg by mouth every morning.     . carvedilol (COREG) 3.125 MG tablet TAKE 1 TABLET(3.125 MG) BY MOUTH TWICE DAILY WITH A MEAL 180 tablet 3  . citalopram (CELEXA) 10 MG tablet Take 10 mg by mouth daily.     . clindamycin (CLEOCIN) 300 MG capsule Take 600 mg by mouth as needed (1 hour prior to dental work).    . clonazePAM (KLONOPIN) 0.5 MG tablet Take 0.5 mg by mouth 2 (two) times daily.     . febuxostat (ULORIC) 40 MG tablet Take 40 mg by mouth at bedtime.     . fexofenadine (ALLEGRA) 180 MG tablet Take 180 mg by mouth 2 (two) times daily.    . fluticasone (FLONASE) 50 MCG/ACT nasal spray INSTILL 2 SPRAYS INTO EACH NOSTRIL ONCE DAILY  7  . gabapentin (NEURONTIN) 300 MG capsule Take 300 mg by mouth 2 (two) times daily.    Marland Kitchen  gabapentin (NEURONTIN) 800 MG tablet Take 800 mg by mouth as needed.    Marland Kitchen ipratropium (ATROVENT) 0.03 % nasal spray INSTILL 2 SPRAYS INTO EACH NOSTRIL TWICE A DAY  10  . levothyroxine (SYNTHROID, LEVOTHROID) 125 MCG tablet Take 125 mcg by mouth daily before breakfast. On an empty stomach    . metolazone (ZAROXOLYN) 2.5 MG tablet take 1 tablet by mouth if needed for FLUID OR EDEMA 30 tablet 2  . midodrine (PROAMATINE) 10 MG tablet Take 1 tablet (10 mg total) by mouth 3 (three) times daily with meals. 90 tablet 3  . montelukast (SINGULAIR) 10 MG tablet Take 10 mg by mouth at bedtime.   1  . potassium chloride SA (K-DUR,KLOR-CON) 20 MEQ tablet Take 3 tablets (60 mEq total) by mouth 2 (two) times daily. 180 tablet 11  . simvastatin (ZOCOR) 10 MG tablet Take 10 mg by mouth every evening.     Marland Kitchen spironolactone (ALDACTONE) 25 MG tablet Take 0.5 tablets (12.5 mg total) by mouth daily. 45 tablet 3  . torsemide (DEMADEX) 20 MG tablet Take 3 tablets (60 mg total) by mouth 2 (two) times daily. 180 tablet 3  . zolpidem (AMBIEN) 10 MG tablet Take 10 mg by mouth at bedtime as needed. For sleep     Current Facility-Administered Medications on File Prior to Visit  Medication Dose Route Frequency Provider Last Rate Last Dose  . triamcinolone acetonide (KENALOG-40) injection 20 mg  20 mg Other Once Landis Martins, DPM       Allergies  Allergen Reactions  . Ceftin Anaphylaxis    Face and throat swell   .  Shellfish Allergy Other (See Comments)    Gout exacerbation  . Allopurinol Nausea Only and Other (See Comments)    weakness  . Ativan [Lorazepam] Itching  . Lorazepam Itching  . Sulfa Antibiotics Itching  . Ultram [Tramadol Hcl] Itching   Family History  Problem Relation Age of Onset  . Alzheimer's disease Mother   . CVA Mother   . Hypertension Mother   . Cancer - Colon Father        late 43  . Birth defects Paternal Uncle     PE: BP 108/70   Pulse 72   Ht 5' (1.524 m)   Wt 202 lb (91.6 kg)    HC 1" (2.5 cm)   SpO2 99%   BMI 39.45 kg/m  Wt Readings from Last 3 Encounters:  02/10/18 202 lb (91.6 kg)  02/09/18 197 lb (89.4 kg)  02/02/18 203 lb (92.1 kg)   Constitutional: overweight, in NAD Eyes: PERRLA, EOMI, no exophthalmos ENT: moist mucous membranes, no thyromegaly, no cervical lymphadenopathy Cardiovascular: RRR, No MRG Respiratory: CTA B Gastrointestinal: abdomen soft, NT, ND, BS+ Musculoskeletal: no deformities, strength intact in all 4 Skin: moist, warm, no rashes Neurological: no tremor with outstretched hands, DTR normal in all 4  ASSESSMENT: 1. Hypothyroidism  2. Eucalcemic hyperparathyroidism  PLAN:  1. Patient with long-standing hypothyroidism, on levothyroxine therapy.  She is not taking the levothyroxine correctly.  She is taking it at night, after dinner.  She sometimes waits an hour after dinner, but sometimes she skips the medication completely as she falls asleep during dinner.  Her dinner is usually after 10 PM. - she appears euthyroid, but has complaints that could be attributed to hypothyroidism: Weight gain (although she was able to lose recently 30 pounds), fatigue, feeling cold - she does not appear to have a goiter, thyroid nodules, or neck compression symptoms - We discussed about correct intake of levothyroxine, in a.m., fasting, with water, separated by at least 30 minutes from breakfast, and separated by more than 4 hours from calcium, iron, multivitamins, acid reflux medications (PPIs).  I strongly advised her to move the levothyroxine to before breakfast and move the multivitamins at night.  By changing these, will probably stimulate the absorption of levothyroxine so she will need a lower LT4 dose.  I also explained the importance of taking the medication consistently and not missed doses - will check thyroid tests today: TSH, free T4 and will then repeat these labs in 1.5 months, after she starts taking them correctly. - If labs today are  abnormal, she will need to return in ~6 weeks for repeat labs - Otherwise, I will see her back in 4 months  2. Eucalcemic hyperparathyroidism Patient did not have elevated calcium, and even had 2 levels that were slightly low.  She had a recent high PTH, at 109, for a calcium of 9.2 close to the lower limit of normal. - It is unclear whether patient has vitamin D deficiency.  Latest level was normal, but this was 6 years ago. - No apparent complications from hypercalcemia: no h/o nephrolithiasis, no osteoporosis, no fractures. No abdominal pain, depression, bone pain.  She does have joint pain. - I discussed with the patient and her husband about the physiology of calcium and parathyroid hormone, and possible side effects from increased PTH, including kidney stones, osteoporosis, abdominal pain, etc.  -Due to the lack of hypercalcemia and with calcium in the lower normal range, my suspicion is for secondary hyperparathyroidism (due to vitamin  D deficiency,  renal insufficiency, hypercalciuria, calcium malabsorption, etc). - We discussed that we first need to check her vitamin D level >> in the setting of a low vitamin D, the parathyroid hormone can be elevated, which is not a pathologic finding. However, if the PTH is elevated in the setting of a normal vitamin D, based on her calcium levels, my suspicion would be for hyperparathyroidism due to CKD.  Therefore, if vitamin D level is normal, she would benefit from seeing nephrology -We discussed about possible consequences of hyperparathyroidism, but usually in the presence of hypercalcemia: Osteoporosis, nephrolithiasis - I will advise her about vitamin D supplement dose when the results of the vitamin D level are back. - I will see the patient back in 4 months  Component     Latest Ref Rng & Units 02/10/2018  Glucose     65 - 99 mg/dL 96  BUN     7 - 25 mg/dL 41 (H)  Creatinine     0.50 - 0.99 mg/dL 1.85 (H)  GFR, Est Non African American      > OR = 60 mL/min/1.72m2 28 (L)  GFR, Est African American     > OR = 60 mL/min/1.68m2 33 (L)  BUN/Creatinine Ratio     6 - 22 (calc) 22  Sodium     135 - 146 mmol/L 138  Potassium     3.5 - 5.3 mmol/L 3.9  Chloride     98 - 110 mmol/L 100  CO2     20 - 32 mmol/L 25  Calcium     8.6 - 10.4 mg/dL 9.4  PTH, Intact     15 - 65 pg/mL 31  TSH     0.35 - 4.50 uIU/mL 1.20  T4,Free(Direct)     0.60 - 1.60 ng/dL 1.10  VITD     30.00 - 100.00 ng/mL 39.48   PTH and calcium normal, as is her vitamin D.  However, her kidney function is very low.  I suspect that her previous parathyroid elevation was secondary to her decreasing kidney function.  I would suggest a referral to nephrology.  Her TFTs are normal.  Since she is moving her levothyroxine in the morning, I will decrease her dose to 100 mcg daily.  Will need another set of thyroid labs in 1.5 months.  Philemon Kingdom, MD PhD [facility]

## 2018-02-10 NOTE — Patient Instructions (Addendum)
Please stop at the lab.  Please move Levothyroxine in am and multivitamins at night.  Please continue Levothyroxine 125 mcg daily - but after the results are back, we need to decrease the dose.  Take the thyroid hormone every day, with water, at least 30 minutes before breakfast, separated by at least 4 hours from: - acid reflux medications - calcium - iron - multivitamins  Please come back for a follow-up appointment in 4 months.

## 2018-02-11 DIAGNOSIS — I951 Orthostatic hypotension: Secondary | ICD-10-CM | POA: Diagnosis not present

## 2018-02-11 DIAGNOSIS — I5022 Chronic systolic (congestive) heart failure: Secondary | ICD-10-CM | POA: Diagnosis not present

## 2018-02-11 DIAGNOSIS — D649 Anemia, unspecified: Secondary | ICD-10-CM | POA: Diagnosis not present

## 2018-02-11 DIAGNOSIS — I878 Other specified disorders of veins: Secondary | ICD-10-CM | POA: Diagnosis not present

## 2018-02-11 DIAGNOSIS — R296 Repeated falls: Secondary | ICD-10-CM | POA: Diagnosis not present

## 2018-02-11 DIAGNOSIS — I429 Cardiomyopathy, unspecified: Secondary | ICD-10-CM | POA: Diagnosis not present

## 2018-02-11 DIAGNOSIS — E1142 Type 2 diabetes mellitus with diabetic polyneuropathy: Secondary | ICD-10-CM | POA: Diagnosis not present

## 2018-02-11 DIAGNOSIS — M1990 Unspecified osteoarthritis, unspecified site: Secondary | ICD-10-CM | POA: Diagnosis not present

## 2018-02-11 DIAGNOSIS — I110 Hypertensive heart disease with heart failure: Secondary | ICD-10-CM | POA: Diagnosis not present

## 2018-02-11 LAB — PARATHYROID HORMONE, INTACT (NO CA): PTH: 31 pg/mL (ref 15–65)

## 2018-02-11 MED ORDER — LEVOTHYROXINE SODIUM 100 MCG PO TABS: 100.0000 ug | ORAL_TABLET | Freq: Every day | ORAL | 3 refills | Status: DC

## 2018-02-14 ENCOUNTER — Ambulatory Visit (INDEPENDENT_AMBULATORY_CARE_PROVIDER_SITE_OTHER): Payer: Medicare HMO

## 2018-02-14 DIAGNOSIS — Z95810 Presence of automatic (implantable) cardiac defibrillator: Secondary | ICD-10-CM

## 2018-02-14 DIAGNOSIS — I5032 Chronic diastolic (congestive) heart failure: Secondary | ICD-10-CM | POA: Diagnosis not present

## 2018-02-14 LAB — CUP PACEART REMOTE DEVICE CHECK
Battery Remaining Longevity: 23 mo
Battery Voltage: 2.93 V
Brady Statistic AP VP Percent: 0.05 %
Brady Statistic AP VS Percent: 0.01 %
Brady Statistic AS VP Percent: 99.92 %
Brady Statistic AS VS Percent: 0.03 %
Brady Statistic RA Percent Paced: 0.06 %
Brady Statistic RV Percent Paced: 99.96 %
Date Time Interrogation Session: 20190815083823
HighPow Impedance: 66 Ohm
Implantable Lead Implant Date: 20141210
Implantable Lead Implant Date: 20141210
Implantable Lead Implant Date: 20141210
Implantable Lead Location: 753858
Implantable Lead Location: 753859
Implantable Lead Location: 753860
Implantable Lead Model: 4396
Implantable Lead Model: 5076
Implantable Lead Model: 6935
Implantable Pulse Generator Implant Date: 20141210
Lead Channel Impedance Value: 342 Ohm
Lead Channel Impedance Value: 342 Ohm
Lead Channel Impedance Value: 456 Ohm
Lead Channel Impedance Value: 532 Ohm
Lead Channel Impedance Value: 589 Ohm
Lead Channel Impedance Value: 627 Ohm
Lead Channel Pacing Threshold Amplitude: 0.625 V
Lead Channel Pacing Threshold Amplitude: 1.125 V
Lead Channel Pacing Threshold Amplitude: 1.25 V
Lead Channel Pacing Threshold Pulse Width: 0.4 ms
Lead Channel Pacing Threshold Pulse Width: 0.4 ms
Lead Channel Pacing Threshold Pulse Width: 0.4 ms
Lead Channel Sensing Intrinsic Amplitude: 22 mV
Lead Channel Sensing Intrinsic Amplitude: 22 mV
Lead Channel Sensing Intrinsic Amplitude: 3.125 mV
Lead Channel Sensing Intrinsic Amplitude: 3.125 mV
Lead Channel Setting Pacing Amplitude: 2.25 V
Lead Channel Setting Pacing Amplitude: 2.5 V
Lead Channel Setting Pacing Amplitude: 2.5 V
Lead Channel Setting Pacing Pulse Width: 0.4 ms
Lead Channel Setting Pacing Pulse Width: 0.4 ms
Lead Channel Setting Sensing Sensitivity: 0.3 mV

## 2018-02-14 NOTE — Progress Notes (Signed)
EPIC Encounter for ICM Monitoring  Patient Name: Rhonda [name] is a 67 y.o. female Date: 02/14/2018 Primary Care Physican: Maurice Small, MD Primary Cardiologist:Bensimhon Electrophysiologist: Lovena Le Dry Weight:197lbs Bi-V Pacing: 100%      Heart Failure questions reviewed, pt asymptomatic. She has lost 5-6 lbs in the last couple weeks.   Thoracic impedance close to baseline normal but fluid index remains >threshold.  Prescribed: Torsemide 20 mg3tablets (60 mg total)twice a day.Can take extra as needed per 01/18/2018 office note. Potassium 20 meq 3tablets (60 mEq total)twice a day. Metolazone 2.71m 1 tablet by mouth if needed for fluid or edema.  Labs: 02/10/2018 Creatinine 1.85, BUN 41, Potassium 3.9, Sodium 138, EGFR 28-33 10/29/2017 Creatinine 1.45, BUN 24, Potassium 3.9, Sodium 141, EGFR 37-43 10/21/2017 Creatinine 1.57, BUN 19, Potassium 3.8, Sodium 140, EGFR 34-39 09/23/2017 Creatinine 1.26, BUN 29, Potassium 3.2, Sodium 142, EGFR 49-56 09/09/2017 Creatinine 1.22, BUN 20, Potassium 3.4, Sodium 140, EGFR 46-53 08/25/2017 Creatinine 1.52, BUN 47, Potassium 3.1, Sodium 137, EGFR 35-41 07/28/2017 Creatinine 1.69, BUN 53, Potassium 3.0, Sodium 139, EGFR 31-36 (low potassium addressed by HF clinic) 06/21/2017 Creatinine 2.06, BUN 38, Potassium 2.5, Sodium 136, EGFR 24-28  Recommendations: No changes.  Reinforced salt restriction to < 2000 mg daily and fluid intake to 64 oz daily.  Encouraged to call for fluid symptoms.  Follow-up plan: ICM clinic phone appointment on 03/17/2018.   Office appointment scheduled 03/30/2018 with HF clinic NP/PA.    Copy of ICM check sent to Dr. TLovena Le   3 month ICM trend: 02/14/2018    1 Year ICM trend:       [name] RN 02/14/2018 11:46 AM

## 2018-02-15 ENCOUNTER — Telehealth: Payer: Self-pay | Admitting: Internal Medicine

## 2018-02-15 NOTE — Telephone Encounter (Signed)
Patient returned your call from 09/20  Please Advise

## 2018-02-15 NOTE — Telephone Encounter (Signed)
Left message for patient to return our call at [phone].  

## 2018-02-15 NOTE — Telephone Encounter (Signed)
Notes recorded by Philemon Kingdom, MD on 02/11/2018 at 5:02 PM EDT Rhonda [name], can you please call pt:  PTH and calcium are now normal, as is her vitamin D. However, her kidney function is lower. I suspect that her previously high PTH level was related to her kidney disease. I would suggest a referral to nephrology. I also let her PCP know. Her TFTs are normal. However, since we are moving her levothyroxine in the morning, we will decrease the dose of her levothyroxine from 125 mcg to 100 mcg daily. I sent the new prescription to her pharmacy. We will need another set of labs in 1.5 months. Labs are in

## 2018-02-16 ENCOUNTER — Telehealth (HOSPITAL_COMMUNITY): Payer: Self-pay

## 2018-02-16 DIAGNOSIS — F3289 Other specified depressive episodes: Secondary | ICD-10-CM | POA: Diagnosis not present

## 2018-02-16 DIAGNOSIS — F0632 Mood disorder due to known physiological condition with major depressive-like episode: Secondary | ICD-10-CM | POA: Diagnosis not present

## 2018-02-16 NOTE — Telephone Encounter (Signed)
Left message for patient to return our call at [phone].  

## 2018-02-16 NOTE — Telephone Encounter (Signed)
Pt called to confirm our [facility] visit for today; no answer; vm left.

## 2018-02-17 ENCOUNTER — Encounter (HOSPITAL_COMMUNITY): Payer: Self-pay

## 2018-02-17 ENCOUNTER — Other Ambulatory Visit (HOSPITAL_COMMUNITY): Payer: Self-pay

## 2018-02-17 DIAGNOSIS — F3289 Other specified depressive episodes: Secondary | ICD-10-CM | POA: Diagnosis not present

## 2018-02-17 DIAGNOSIS — F0632 Mood disorder due to known physiological condition with major depressive-like episode: Secondary | ICD-10-CM | POA: Diagnosis not present

## 2018-02-17 NOTE — Progress Notes (Signed)
Paramedicine Encounter    Patient ID: Rhonda [name], female    DOB: [birth year], 67 y.o.   MRN: [id number]    Patient Care Team: Maurice Small, MD as PCP - General (Family Medicine)  Patient Active Problem List   Diagnosis Date Noted  . Orthostatic hypotension 07/28/2017  . SVD (spontaneous vaginal delivery)   . Peripheral neuropathy   . On home oxygen therapy   . Migraines   . Left bundle branch block   . Hypothyroidism   . Hypertension   . Hyperlipidemia   . Heart murmur   . GERD (gastroesophageal reflux disease)   . Exertional shortness of breath   . Depression   . Complication of anesthesia   . Back pain   . Arthritis   . Anxiety   . Anemia   . HTN (hypertension) 09/14/2013  . Chronic respiratory failure ([place]) 09/14/2013  . 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 Morbid obesity ([place]) 11/01/2012  . Chronic systolic heart failure ([place]) 10/27/2012  . S/P right TKA 05/10/2012  . Endometrial polyp 01/20/2012  . Breast cancer, stage 1 ([place]) 03/26/2011  . Unspecified vitamin D deficiency 03/26/2011    Current Outpatient Medications:  .  acetaZOLAMIDE (DIAMOX) 125 MG tablet, Take 1 tablet (125 mg total) by mouth daily., Disp: 30 tablet, Rfl: 2 .  buPROPion (WELLBUTRIN XL) 150 MG 24 hr tablet, Take 450 mg by mouth every morning. , Disp: , Rfl:  .  carvedilol (COREG) 3.125 MG tablet, TAKE 1 TABLET(3.125 MG) BY MOUTH TWICE DAILY WITH A MEAL, Disp: 180 tablet, Rfl: 3 .  citalopram (CELEXA) 10 MG tablet, Take 10 mg by mouth daily. , Disp: , Rfl:  .  clonazePAM (KLONOPIN) 0.5 MG tablet, Take 0.5 mg by mouth 2 (two) times daily. , Disp: , Rfl:  .  fluticasone (FLONASE) 50 MCG/ACT nasal spray, INSTILL 2 SPRAYS INTO EACH NOSTRIL ONCE DAILY, Disp: , Rfl: 7 .  gabapentin (NEURONTIN) 300 MG capsule, Take 300 mg by mouth 2 (two) times daily., Disp: , Rfl:  .  ipratropium (ATROVENT) 0.03 % nasal spray, INSTILL 2 SPRAYS INTO EACH NOSTRIL TWICE A  DAY, Disp: , Rfl: 10 .  levothyroxine (SYNTHROID, LEVOTHROID) 100 MCG tablet, Take 1 tablet (100 mcg total) by mouth daily., Disp: 45 tablet, Rfl: 3 .  midodrine (PROAMATINE) 10 MG tablet, Take 1 tablet (10 mg total) by mouth 3 (three) times daily with meals., Disp: 90 tablet, Rfl: 3 .  montelukast (SINGULAIR) 10 MG tablet, Take 10 mg by mouth at bedtime. , Disp: , Rfl: 1 .  potassium chloride SA (K-DUR,KLOR-CON) 20 MEQ tablet, Take 3 tablets (60 mEq total) by mouth 2 (two) times daily., Disp: 180 tablet, Rfl: 11 .  simvastatin (ZOCOR) 10 MG tablet, Take 10 mg by mouth every evening. , Disp: , Rfl:  .  spironolactone (ALDACTONE) 25 MG tablet, Take 0.5 tablets (12.5 mg total) by mouth daily., Disp: 45 tablet, Rfl: 3 .  torsemide (DEMADEX) 20 MG tablet, Take 3 tablets (60 mg total) by mouth 2 (two) times daily., Disp: 180 tablet, Rfl: 3 .  zolpidem (AMBIEN) 10 MG tablet, Take 10 mg by mouth at bedtime as needed. For sleep, Disp: , Rfl:  .  ALPRAZolam (XANAX) 0.25 MG tablet, , Disp: , Rfl: 0 .  ARIPiprazole (ABILIFY) 2 MG tablet, Take 2 mg by mouth every morning. , Disp: , Rfl:  .  BISACODYL 5 MG EC tablet, TK UTD, Disp: , Rfl: 0 .  clindamycin (CLEOCIN) 300 MG capsule, Take 600 mg by mouth as needed (1 hour prior to dental work)., Disp: , Rfl:  .  febuxostat (ULORIC) 40 MG tablet, Take 40 mg by mouth at bedtime. , Disp: , Rfl:  .  fexofenadine (ALLEGRA) 180 MG tablet, Take 180 mg by mouth 2 (two) times daily., Disp: , Rfl:  .  gabapentin (NEURONTIN) 800 MG tablet, Take 800 mg by mouth as needed., Disp: , Rfl:  .  metolazone (ZAROXOLYN) 2.5 MG tablet, take 1 tablet by mouth if needed for FLUID OR EDEMA, Disp: 30 tablet, Rfl: 2  Current Facility-Administered Medications:  .  triamcinolone acetonide (KENALOG-40) injection 20 mg, 20 mg, Other, Once, Landis Martins, DPM Allergies  Allergen Reactions  . Ceftin Anaphylaxis    Face and throat swell   . Shellfish Allergy Other (See Comments)    Gout  exacerbation  . Allopurinol Nausea Only and Other (See Comments)    weakness  . Ativan [Lorazepam] Itching  . Lorazepam Itching  . Sulfa Antibiotics Itching  . Ultram [Tramadol Hcl] Itching     Social History   Socioeconomic History  . Marital status: Married    Spouse name: Not on file  . Number of children: 2  . Years of education: masters  . Highest education level: Not on file  Occupational History  . Not on file  Social Needs  . Financial resource strain: Not on file  . Food insecurity:    Worry: Not on file    Inability: Not on file  . Transportation needs:    Medical: Not on file    Non-medical: Not on file  Tobacco Use  . Smoking status: Former Smoker    Packs/day: 0.10    Years: 26.00    Pack years: 2.60    Types: Cigarettes    Last attempt to quit: 05/06/2007    Years since quitting: 10.8  . Smokeless tobacco: Never Used  Substance and Sexual Activity  . Alcohol use: No  . Drug use: No  . Sexual activity: Yes  Lifestyle  . Physical activity:    Days per week: Not on file    Minutes per session: Not on file  . Stress: Not on file  Relationships  . Social connections:    Talks on phone: Not on file    Gets together: Not on file    Attends religious service: Not on file    Active member of club or organization: Not on file    Attends meetings of clubs or organizations: Not on file    Relationship status: Not on file  . Intimate partner violence:    Fear of current or ex partner: Not on file    Emotionally abused: Not on file    Physically abused: Not on file    Forced sexual activity: Not on file  Other Topics Concern  . Not on file  Social History Narrative   Tobacco Use Cigarettes: Former Smoker, Quit in 2008   No Alcohol   No recreational drug use   Diet: Regular/Low Carb   Exercise: None   Occupation: disabled   Education: Research officer, political party, masters   Children: 2   Firearms: No   Therapist, art Use: Always   Former Metallurgist.         Physical Exam  Pulmonary/Chest: Effort normal. No respiratory distress.  Skin: Skin is warm and dry. She is not diaphoretic.        Future Appointments  Date Time  Provider [place]  03/04/2018 11:00 AM Narda Amber K, DO [facility] None  03/16/2018  3:30 PM Marzetta Board, MD [facility]  03/17/2018 12:55 PM [facility] DEVICE REMOTES [facility]  03/30/2018  1:30 PM [facility] PA/NP [facility] None  04/07/2018  8:05 AM [facility] DEVICE REMOTES [facility]  06/09/2018 10:30 AM Philemon Kingdom, MD [facility] None  07/14/2018  9:00 AM Kandis Nab, PsyD [facility] None  07/14/2018 10:00 AM [facility] TECH [facility] None  08/01/2018 10:00 AM Kandis Nab, PsyD [facility] None     BP 114/70   Pulse 90   Wt 196 lb (88.9 kg)   SpO2 95%   BMI 38.28 kg/m   Weight yesterday-195 Last visit [id number]  ATF pt CAO x4 sitting at the kitchen table c/o dizziness. Pt stated that her symptoms isn't different from her norm.  She's still having trouble with short term memory loss, she stated that she forgets where she put things, forget what she's talking about during conversations.  She stated that she had a visit with her psychiatrist earlier today; no med changes.  Pt has taken all of meds this week. She denies chest pain, sob and dizziness. rx bottles verified and pill box refilled.   Medication ordered:  Bupropion citalopram   Farren Landa, EMT Paramedic [phone] 02/22/2018    ACTION: Home visit completed

## 2018-02-18 DIAGNOSIS — I429 Cardiomyopathy, unspecified: Secondary | ICD-10-CM | POA: Diagnosis not present

## 2018-02-18 DIAGNOSIS — R296 Repeated falls: Secondary | ICD-10-CM | POA: Diagnosis not present

## 2018-02-18 DIAGNOSIS — I878 Other specified disorders of veins: Secondary | ICD-10-CM | POA: Diagnosis not present

## 2018-02-18 DIAGNOSIS — I5022 Chronic systolic (congestive) heart failure: Secondary | ICD-10-CM | POA: Diagnosis not present

## 2018-02-18 DIAGNOSIS — M1990 Unspecified osteoarthritis, unspecified site: Secondary | ICD-10-CM | POA: Diagnosis not present

## 2018-02-18 DIAGNOSIS — E1142 Type 2 diabetes mellitus with diabetic polyneuropathy: Secondary | ICD-10-CM | POA: Diagnosis not present

## 2018-02-18 DIAGNOSIS — I110 Hypertensive heart disease with heart failure: Secondary | ICD-10-CM | POA: Diagnosis not present

## 2018-02-18 DIAGNOSIS — D649 Anemia, unspecified: Secondary | ICD-10-CM | POA: Diagnosis not present

## 2018-02-18 DIAGNOSIS — I951 Orthostatic hypotension: Secondary | ICD-10-CM | POA: Diagnosis not present

## 2018-02-22 NOTE — Progress Notes (Signed)
Unable to contact letter mailed.

## 2018-02-24 ENCOUNTER — Telehealth (HOSPITAL_COMMUNITY): Payer: Self-pay

## 2018-02-24 ENCOUNTER — Other Ambulatory Visit (HOSPITAL_COMMUNITY): Payer: Self-pay

## 2018-02-24 NOTE — Progress Notes (Signed)
Paramedicine Encounter    Patient ID: Rhonda [name], female    DOB: 1950/08/17, 67 y.o.   MRN: [id number]    Patient Care Team: Maurice Small, MD as PCP - General (Family Medicine)  Patient Active Problem List   Diagnosis Date Noted  . Orthostatic hypotension 07/28/2017  . SVD (spontaneous vaginal delivery)   . Peripheral neuropathy   . On home oxygen therapy   . Migraines   . Left bundle branch block   . Hypothyroidism   . Hypertension   . Hyperlipidemia   . Heart murmur   . GERD (gastroesophageal reflux disease)   . Exertional shortness of breath   . Depression   . Complication of anesthesia   . Back pain   . Arthritis   . Anxiety   . Anemia   . HTN (hypertension) 09/14/2013  . Chronic respiratory failure ([place]) 09/14/2013  . 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 Morbid obesity ([place]) 11/01/2012  . Chronic systolic heart failure ([place]) 10/27/2012  . S/P right TKA 05/10/2012  . Endometrial polyp 01/20/2012  . Breast cancer, stage 1 ([place]) 03/26/2011  . Unspecified vitamin D deficiency 03/26/2011    Current Outpatient Medications:  .  acetaZOLAMIDE (DIAMOX) 125 MG tablet, Take 1 tablet (125 mg total) by mouth daily., Disp: 30 tablet, Rfl: 2 .  buPROPion (WELLBUTRIN XL) 150 MG 24 hr tablet, Take 450 mg by mouth every morning. , Disp: , Rfl:  .  carvedilol (COREG) 3.125 MG tablet, TAKE 1 TABLET(3.125 MG) BY MOUTH TWICE DAILY WITH A MEAL, Disp: 180 tablet, Rfl: 3 .  citalopram (CELEXA) 10 MG tablet, Take 10 mg by mouth daily. , Disp: , Rfl:  .  clonazePAM (KLONOPIN) 0.5 MG tablet, Take 0.5 mg by mouth 2 (two) times daily. , Disp: , Rfl:  .  ALPRAZolam (XANAX) 0.25 MG tablet, , Disp: , Rfl: 0 .  ARIPiprazole (ABILIFY) 2 MG tablet, Take 2 mg by mouth every morning. , Disp: , Rfl:  .  BISACODYL 5 MG EC tablet, TK UTD, Disp: , Rfl: 0 .  clindamycin (CLEOCIN) 300 MG capsule, Take 600 mg by mouth as needed (1 hour prior to dental work).,  Disp: , Rfl:  .  febuxostat (ULORIC) 40 MG tablet, Take 40 mg by mouth at bedtime. , Disp: , Rfl:  .  fexofenadine (ALLEGRA) 180 MG tablet, Take 180 mg by mouth 2 (two) times daily., Disp: , Rfl:  .  fluticasone (FLONASE) 50 MCG/ACT nasal spray, INSTILL 2 SPRAYS INTO EACH NOSTRIL ONCE DAILY, Disp: , Rfl: 7 .  gabapentin (NEURONTIN) 300 MG capsule, Take 300 mg by mouth 2 (two) times daily., Disp: , Rfl:  .  gabapentin (NEURONTIN) 800 MG tablet, Take 800 mg by mouth as needed., Disp: , Rfl:  .  ipratropium (ATROVENT) 0.03 % nasal spray, INSTILL 2 SPRAYS INTO EACH NOSTRIL TWICE A DAY, Disp: , Rfl: 10 .  levothyroxine (SYNTHROID, LEVOTHROID) 100 MCG tablet, Take 1 tablet (100 mcg total) by mouth daily., Disp: 45 tablet, Rfl: 3 .  metolazone (ZAROXOLYN) 2.5 MG tablet, take 1 tablet by mouth if needed for FLUID OR EDEMA, Disp: 30 tablet, Rfl: 2 .  midodrine (PROAMATINE) 10 MG tablet, Take 1 tablet (10 mg total) by mouth 3 (three) times daily with meals., Disp: 90 tablet, Rfl: 3 .  montelukast (SINGULAIR) 10 MG tablet, Take 10 mg by mouth at bedtime. , Disp: , Rfl: 1 .  potassium chloride SA (K-DUR,KLOR-CON) 20 MEQ tablet, Take  3 tablets (60 mEq total) by mouth 2 (two) times daily., Disp: 180 tablet, Rfl: 11 .  simvastatin (ZOCOR) 10 MG tablet, Take 10 mg by mouth every evening. , Disp: , Rfl:  .  spironolactone (ALDACTONE) 25 MG tablet, Take 0.5 tablets (12.5 mg total) by mouth daily., Disp: 45 tablet, Rfl: 3 .  torsemide (DEMADEX) 20 MG tablet, Take 3 tablets (60 mg total) by mouth 2 (two) times daily., Disp: 180 tablet, Rfl: 3 .  zolpidem (AMBIEN) 10 MG tablet, Take 10 mg by mouth at bedtime as needed. For sleep, Disp: , Rfl:   Current Facility-Administered Medications:  .  triamcinolone acetonide (KENALOG-40) injection 20 mg, 20 mg, Other, Once, Landis Martins, DPM Allergies  Allergen Reactions  . Ceftin Anaphylaxis    Face and throat swell   . Shellfish Allergy Other (See Comments)    Gout  exacerbation  . Allopurinol Nausea Only and Other (See Comments)    weakness  . Ativan [Lorazepam] Itching  . Lorazepam Itching  . Sulfa Antibiotics Itching  . Ultram [Tramadol Hcl] Itching     Social History   Socioeconomic History  . Marital status: Married    Spouse name: Not on file  . Number of children: 2  . Years of education: masters  . Highest education level: Not on file  Occupational History  . Not on file  Social Needs  . Financial resource strain: Not on file  . Food insecurity:    Worry: Not on file    Inability: Not on file  . Transportation needs:    Medical: Not on file    Non-medical: Not on file  Tobacco Use  . Smoking status: Former Smoker    Packs/day: 0.10    Years: 26.00    Pack years: 2.60    Types: Cigarettes    Last attempt to quit: 05/06/2007    Years since quitting: 10.8  . Smokeless tobacco: Never Used  Substance and Sexual Activity  . Alcohol use: No  . Drug use: No  . Sexual activity: Yes  Lifestyle  . Physical activity:    Days per week: Not on file    Minutes per session: Not on file  . Stress: Not on file  Relationships  . Social connections:    Talks on phone: Not on file    Gets together: Not on file    Attends religious service: Not on file    Active member of club or organization: Not on file    Attends meetings of clubs or organizations: Not on file    Relationship status: Not on file  . Intimate partner violence:    Fear of current or ex partner: Not on file    Emotionally abused: Not on file    Physically abused: Not on file    Forced sexual activity: Not on file  Other Topics Concern  . Not on file  Social History Narrative   Tobacco Use Cigarettes: Former Smoker, Quit in 2008   No Alcohol   No recreational drug use   Diet: Regular/Low Carb   Exercise: None   Occupation: disabled   Education: Research officer, political party, masters   Children: 2   Firearms: No   Therapist, art Use: Always   Former Metallurgist.         Physical Exam      Future Appointments  Date Time Provider [place]  03/04/2018 11:00 AM Narda Amber K, DO [facility] None  03/16/2018  3:30 PM Acquanetta Sit  L, MD [facility]  03/17/2018 12:55 PM [facility] DEVICE REMOTES [facility]  03/30/2018  1:30 PM [facility] PA/NP [facility] None  04/07/2018  8:05 AM [facility] DEVICE REMOTES [facility]  06/09/2018 10:30 AM Philemon Kingdom, MD [facility] None  07/14/2018  9:00 AM Kandis Nab, PsyD [facility] None  07/14/2018 10:00 AM [facility] NEUROPSYCH TECH [facility] None  08/01/2018 10:00 AM Kandis Nab, PsyD [facility] None     BP (!) 148/88 (BP Location: Right Arm, Patient Position: Sitting, Cuff Size: Large)   Pulse 67   Resp 16   SpO2 94%   Weight yesterday-195 Last visit weight-196  ATF pt CAO  x4 playing with her grandchildren. Pt stated that she had been feeling down for a few days and she wondered if it was because I put 10mg  (.5 tab) of celexa in her pill box. Pt stated that she usually take a whole pill of 20mg . The prescription from 02/17/18 reflects the same.  Pts pill box was refilled accordingly. Pt denies sob, chest pain and dizziness.  rx bottles verified and pill box refilled.  Pt is currently out of uloric.    Medication ordered: uloric -- pt will check with her [place], EMT Paramedic [phone] 02/24/2018    ACTION: Home visit completed

## 2018-02-24 NOTE — Telephone Encounter (Signed)
Pt called to confirm 3pm [facility] visit.

## 2018-02-25 DIAGNOSIS — I5022 Chronic systolic (congestive) heart failure: Secondary | ICD-10-CM | POA: Diagnosis not present

## 2018-02-25 DIAGNOSIS — R296 Repeated falls: Secondary | ICD-10-CM | POA: Diagnosis not present

## 2018-02-25 DIAGNOSIS — I110 Hypertensive heart disease with heart failure: Secondary | ICD-10-CM | POA: Diagnosis not present

## 2018-02-25 DIAGNOSIS — I951 Orthostatic hypotension: Secondary | ICD-10-CM | POA: Diagnosis not present

## 2018-02-25 DIAGNOSIS — M1990 Unspecified osteoarthritis, unspecified site: Secondary | ICD-10-CM | POA: Diagnosis not present

## 2018-02-25 DIAGNOSIS — I878 Other specified disorders of veins: Secondary | ICD-10-CM | POA: Diagnosis not present

## 2018-02-25 DIAGNOSIS — D649 Anemia, unspecified: Secondary | ICD-10-CM | POA: Diagnosis not present

## 2018-02-25 DIAGNOSIS — E1142 Type 2 diabetes mellitus with diabetic polyneuropathy: Secondary | ICD-10-CM | POA: Diagnosis not present

## 2018-02-25 DIAGNOSIS — I429 Cardiomyopathy, unspecified: Secondary | ICD-10-CM | POA: Diagnosis not present

## 2018-02-28 ENCOUNTER — Telehealth: Payer: Self-pay | Admitting: Internal Medicine

## 2018-02-28 DIAGNOSIS — Z23 Encounter for immunization: Secondary | ICD-10-CM | POA: Diagnosis not present

## 2018-02-28 DIAGNOSIS — R197 Diarrhea, unspecified: Secondary | ICD-10-CM | POA: Diagnosis not present

## 2018-02-28 DIAGNOSIS — J019 Acute sinusitis, unspecified: Secondary | ICD-10-CM | POA: Diagnosis not present

## 2018-02-28 NOTE — Telephone Encounter (Signed)
[phone] (home)    I called patient back, phone rang several times than was disconnected.  See note from Sept. Notes recorded by Philemon Kingdom, MD on 02/11/2018 at [place] PM EDT Japheth Diekman, can you please call pt:  PTH and calcium are now normal, as is her vitamin D. However, her kidney function is lower. I suspect that her previously high PTH level was related to her kidney disease. I would suggest a referral to nephrology. I also let her PCP know. Her TFTs are normal. However, since we are moving her levothyroxine in the morning, we will decrease the dose of her levothyroxine from 125 mcg to 100 mcg daily. I sent the new prescription to her pharmacy. We will need another set of labs in 1.5 months. Labs are in

## 2018-02-28 NOTE — Telephone Encounter (Signed)
Pt called stating she received a letter in the mail about [place] trying to reach her. Please advise patient

## 2018-02-28 NOTE — Telephone Encounter (Signed)
Notified patient of message from Dr. Gherghe, patient expressed understanding and agreement. No further questions.  

## 2018-03-01 ENCOUNTER — Telehealth: Payer: Self-pay | Admitting: Neurology

## 2018-03-01 NOTE — Telephone Encounter (Signed)
Patient called and left a vm that she had a fall on Saturday night in the kitchen and she has a sinus infection. She has been feeling bad ever since. Please call her back at [phone]. Thanks!

## 2018-03-01 NOTE — Telephone Encounter (Signed)
Patel pt

## 2018-03-02 DIAGNOSIS — R197 Diarrhea, unspecified: Secondary | ICD-10-CM | POA: Diagnosis not present

## 2018-03-02 NOTE — Telephone Encounter (Signed)
Called patient back and left message for her to follow up with her PCP for the sinus infection so that they can prescribe something for her.  Instructed her to call me back if she still needs anything else.

## 2018-03-03 ENCOUNTER — Telehealth (HOSPITAL_COMMUNITY): Payer: Self-pay

## 2018-03-03 NOTE — Telephone Encounter (Signed)
Pt was called to confirm today's appointment/no answer/ message left on both numbers

## 2018-03-04 ENCOUNTER — Encounter

## 2018-03-04 ENCOUNTER — Ambulatory Visit: Payer: Medicare HMO | Admitting: Neurology

## 2018-03-04 ENCOUNTER — Encounter: Payer: Self-pay | Admitting: Neurology

## 2018-03-04 ENCOUNTER — Other Ambulatory Visit (HOSPITAL_COMMUNITY): Payer: Self-pay

## 2018-03-04 VITALS — BP 104/60 | HR 86 | Ht 60.0 in | Wt 201.5 lb

## 2018-03-04 DIAGNOSIS — G932 Benign intracranial hypertension: Secondary | ICD-10-CM

## 2018-03-04 DIAGNOSIS — I951 Orthostatic hypotension: Secondary | ICD-10-CM

## 2018-03-04 DIAGNOSIS — F329 Major depressive disorder, single episode, unspecified: Secondary | ICD-10-CM

## 2018-03-04 DIAGNOSIS — E1140 Type 2 diabetes mellitus with diabetic neuropathy, unspecified: Secondary | ICD-10-CM | POA: Diagnosis not present

## 2018-03-04 DIAGNOSIS — R296 Repeated falls: Secondary | ICD-10-CM | POA: Diagnosis not present

## 2018-03-04 NOTE — Progress Notes (Signed)
Follow-up Visit   Date: 03/04/18    Rhonda [name] MRN: [id number] DOB: 06-03-1950   Interim History: Rhonda [name] is a 67 y.o.  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  The patient was accompanied to the clinic by husband who also provides collateral information.    History of present illness: She reports having falls since November 2017, some which occurred by tripping over objects and loosing her balance.  Falls become much worse during 2018.  She often gets lightheaded preceding her falls and nearly always falls backwards. She has been diabetic for at least 10 years and has painful neuropathy of the feet, which is well-controlled on gabapentin 300mg  BID.  She takes extra gabapentin 800mg  as needed for severe pain, about once per week.   Over the past several years, she has noticed problems with short-term memory and family get frustrated with her because she is "slow".  She has a housekeeper that cleans her home.  She is able to cook and do laundry.  She manages her own medications and drives, but sometimes feels uncomfortable when driving.  She does not manage her finances.  On one occasion, she forgot how to put her cars into her ignition or to start the wipers.  She is driving and has not been involved in any MVAs.  She manages finances with her husband.  UPDATE 01/03/2018:  She has suffered several falls, all which have occurred in the kitchen when she is standing still while making dinner in the evening.  She has noticed that prolonged standing for 5-10 minutes always triggers her fall which is preceded by lightheadedness.  At her last visit, I ordered CT cervical spine and brain to evaluate her falls and memory changes.  Imaging showed  increased sclerosis of the calvarium and vertebral bodies.  Her PTH was elevated and she is seeing endocrinology.  Findings also showed possible intracranial hypertension and follow-up LP showed mildly raised ICP at 33mm H20.  There has been no change in her gait or falls following the spinal tap.    UPDATE 03/04/2018:  She is here for follow-up visit.  Since starting midodrine 10mg  TID, she has noticed less frequent falls (once per week) and spells of lightheadedness.  She has been able to reduce gabapentin to 300mg  BID without worsening neuropathic pain.  Today, she is tearful and crying because of ongoing forgetfulness and having to repeat herself.  Her husband states that her mood is very low and she seeing a counselor once per month.  She is overwhelmed by all of her medical conditions.     Medications:  Current Outpatient Medications on File Prior to Visit  Medication Sig Dispense Refill  . acetaZOLAMIDE (DIAMOX) 125 MG tablet Take 1 tablet (125 mg total) by mouth daily. 30 tablet 2  . ALPRAZolam (XANAX) 0.25 MG tablet   0  . ARIPiprazole (ABILIFY) 2 MG tablet Take 2 mg by mouth every morning.     Marland Kitchen BISACODYL 5 MG EC tablet TK UTD  0  . buPROPion (WELLBUTRIN XL) 150 MG 24 hr tablet Take 450 mg by mouth every morning.     . carvedilol (COREG) 3.125 MG tablet TAKE 1 TABLET(3.125 MG) BY MOUTH TWICE DAILY WITH A MEAL 180 tablet 3  . citalopram (CELEXA) 10 MG tablet Take  10 mg by mouth daily.     . clindamycin (CLEOCIN) 300 MG capsule Take 600 mg by mouth as needed (1 hour prior to dental work).    . clonazePAM (KLONOPIN) 0.5 MG tablet Take 0.5 mg by mouth 2 (two) times daily.     . febuxostat (ULORIC) 40 MG tablet Take 40 mg by mouth at bedtime.     . fexofenadine (ALLEGRA) 180 MG tablet Take 180 mg by mouth 2 (two) times daily.    . fluticasone (FLONASE) 50 MCG/ACT nasal spray INSTILL 2 SPRAYS INTO EACH NOSTRIL ONCE DAILY  7  . gabapentin (NEURONTIN) 300 MG capsule Take 300 mg by mouth at  bedtime.    . gabapentin (NEURONTIN) 800 MG tablet Take 800 mg by mouth as needed.    Marland Kitchen ipratropium (ATROVENT) 0.03 % nasal spray INSTILL 2 SPRAYS INTO EACH NOSTRIL TWICE A DAY  10  . levothyroxine (SYNTHROID, LEVOTHROID) 100 MCG tablet Take 1 tablet (100 mcg total) by mouth daily. 45 tablet 3  . metolazone (ZAROXOLYN) 2.5 MG tablet take 1 tablet by mouth if needed for FLUID OR EDEMA 30 tablet 2  . midodrine (PROAMATINE) 10 MG tablet Take 1 tablet (10 mg total) by mouth 3 (three) times daily with meals. 90 tablet 3  . montelukast (SINGULAIR) 10 MG tablet Take 10 mg by mouth at bedtime.   1  . potassium chloride SA (K-DUR,KLOR-CON) 20 MEQ tablet Take 3 tablets (60 mEq total) by mouth 2 (two) times daily. 180 tablet 11  . simvastatin (ZOCOR) 10 MG tablet Take 10 mg by mouth every evening.     Marland Kitchen spironolactone (ALDACTONE) 25 MG tablet Take 0.5 tablets (12.5 mg total) by mouth daily. 45 tablet 3  . torsemide (DEMADEX) 20 MG tablet Take 3 tablets (60 mg total) by mouth 2 (two) times daily. 180 tablet 3  . zolpidem (AMBIEN) 10 MG tablet Take 10 mg by mouth at bedtime as needed. For sleep     Current Facility-Administered Medications on File Prior to Visit  Medication Dose Route Frequency Provider Last Rate Last Dose  . triamcinolone acetonide (KENALOG-40) injection 20 mg  20 mg Other Once Landis Martins, DPM        Allergies:  Allergies  Allergen Reactions  . Ceftin Anaphylaxis    Face and throat swell   . Shellfish Allergy Other (See Comments)    Gout exacerbation  . Allopurinol Nausea Only and Other (See Comments)    weakness  . Ativan [Lorazepam] Itching  . Lorazepam Itching  . Sulfa Antibiotics Itching  . Ultram [Tramadol Hcl] Itching    Review of Systems:  CONSTITUTIONAL: No fevers, chills, night sweats, or weight loss.  EYES: No visual changes or eye pain ENT: No hearing changes.  No history of nose bleeds.   RESPIRATORY: No cough, wheezing and shortness of breath.   +lightheadedness CARDIOVASCULAR: Negative for chest pain, and palpitations.   GI: Negative for abdominal discomfort, blood in stools or black stools.  No recent change in bowel habits.   GU:  No history of incontinence.   MUSCLOSKELETAL: No history of joint pain or swelling.  No myalgias.   SKIN: Negative for lesions, rash, and itching.   ENDOCRINE: Negative for cold or heat intolerance, polydipsia or goiter.   PSYCH:  + depression +anxiety symptoms.   NEURO: As Above.   Vital Signs:  BP 104/60   Pulse 86   Ht 5' (1.524 m)   Wt 201 lb 8 oz (91.4 kg)  SpO2 99%   BMI 39.35 kg/m   Orthostatic VS for the past 24 hrs (Last 3 readings):  BP- Lying Pulse- Lying BP- Sitting Pulse- Sitting BP- Standing at 0 minutes Pulse- Standing at 0 minutes  03/04/18 1119 110/70 84 104/60 85 90/60 90    General Medical Exam:   General:  Tearful, appears depressed, comfortable  Eyes/ENT: see cranial nerve examination.  Cleft lip.  Neck: No masses appreciated.  Full range of motion without tenderness.  No carotid bruits. Respiratory:  Clear to auscultation, good air entry bilaterally.   Cardiac:  Regular rate and rhythm, no murmur.     Neurological Exam: MENTAL STATUS including orientation to time, place, person, recent and remote memory, attention span and concentration, language, and fund of knowledge is fairly intact.  Follows commands easily.  Speech is dysarthric (baseline).  Montreal Cognitive Assessment  07/23/2017 07/31/2016  Visuospatial/ Executive (0/5) 4 5  Naming (0/3) 3 3  Attention: Read list of digits (0/2) 2 2  Attention: Read list of letters (0/1) 1 1  Attention: Serial 7 subtraction starting at 100 (0/3) 1 0  Language: Repeat phrase (0/2) 2 2  Language : Fluency (0/1) 1 1  Abstraction (0/2) 2 2  Delayed Recall (0/5) 0 0  Orientation (0/6) 6 6  Total 22 22  Adjusted Score (based on education) 22 22    CRANIAL NERVES:  Pupils are round and reactive to light. Extraocular muscles  are intact throughout, except restricted with upgaze.  No ptosis.  Face is symmetric.   MOTOR:  Motor strength is 5/5 in all extremities. No pronator drift.  Tone is normal.    COORDINATION/GAIT:  Finger to nose testing is intact. There is mild bradykinesia finger tapping.  Gait is slightly-wide based, unassisted.   Data: Labs 07/31/2016:  Vitamin B1 11, vitamin B12 597, copper 162, SPEP with IFE no M protein  CT head wo contrast performed at [facility] 11/24/2017: No acute intracranial abnormality appreciated.  Expanded empty sella may reflect idiopathic intracranial hypertension.  Thick dense calvarium which is of uncertain clinical significance.  Boney sclerosis can be seen with, but not limited to, hematological conditions such as myelofibrosis, metabolic bone disorders such as hyperthyroidism and hyperparathyroidism and other causes.  Correlate clinically.   CT cervical spine wo contrast 11/24/2017: 1.  No acute fracture 2.  No significant spinal canal or foraminal stenosis 3.  Increased sclerosis of the bony structures.  Boney sclerosis can be seen with, but not limited to, hematological conditions such as myelofibrosis, metabolic bone disorders such as hyperthyroidism and hyperparathyroidism and other causes.  Correlate clinically.    IMPRESSION/PLAN: 1. 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  She also has severe depression and is crying at today's visit because she is frustrated by all her medical problems and lack of independence  - Formal neurocognitive testing has been scheduled  - She is seeing a therapist once a month and I have asked her to increase frequency of visit to specifically work on coping mechanisms  2. Gait abnormality and falls due to orthostatic hypotension and sensory ataxia   - Orthostatic vital signs continue to be positive 110/70 >> 90/60, however she has less lightheadedness  - Continue  midodrine 10mg  TID.  She will be seeing cardiology next month and notes indicate possible tapering of coreg  - Appreciate Dr. Haroldine Laws assistance in management  3. Intracranial hypertension with OP 26cm H20    -  Unable to get MRI due to ICD  - Eye exam is scheduled for next month  - Continue low dose diamox 125mg  daily  4. 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  OK to take gabapentin 800mg  prn for severe pain    Return to clinic after testing  Greater than 50% of this 30 minute visit was spent in counseling, explanation of diagnosis, planning of further management, and coordination of care.   Thank you for allowing me to participate in patient's care.  If I can answer any additional questions, I would be pleased to do so.    Sincerely,    Zahria Ding K. [name], DO

## 2018-03-04 NOTE — Progress Notes (Signed)
Paramedicine Encounter    Patient ID: Rhonda [name], female    DOB: 1951/02/02, 67 y.o.   MRN: [id number]    Patient Care Team: Maurice Small, MD as PCP - General (Family Medicine)  Patient Active Problem List   Diagnosis Date Noted  . Orthostatic hypotension 07/28/2017  . SVD (spontaneous vaginal delivery)   . Peripheral neuropathy   . On home oxygen therapy   . Migraines   . Left bundle branch block   . Hypothyroidism   . Hypertension   . Hyperlipidemia   . Heart murmur   . GERD (gastroesophageal reflux disease)   . Exertional shortness of breath   . Depression   . Complication of anesthesia   . Back pain   . Arthritis   . Anxiety   . Anemia   . HTN (hypertension) 09/14/2013  . Chronic respiratory failure ([place]) 09/14/2013  . 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 Morbid obesity ([place]) 11/01/2012  . Chronic systolic heart failure ([place]) 10/27/2012  . S/P right TKA 05/10/2012  . Endometrial polyp 01/20/2012  . Breast cancer, stage 1 ([place]) 03/26/2011  . Unspecified vitamin D deficiency 03/26/2011    Current Outpatient Medications:  .  buPROPion (WELLBUTRIN XL) 150 MG 24 hr tablet, Take 450 mg by mouth every morning. , Disp: , Rfl:  .  carvedilol (COREG) 3.125 MG tablet, TAKE 1 TABLET(3.125 MG) BY MOUTH TWICE DAILY WITH A MEAL, Disp: 180 tablet, Rfl: 3 .  citalopram (CELEXA) 10 MG tablet, Take 10 mg by mouth daily. , Disp: , Rfl:  .  clonazePAM (KLONOPIN) 0.5 MG tablet, Take 0.5 mg by mouth 2 (two) times daily. , Disp: , Rfl:  .  fexofenadine (ALLEGRA) 180 MG tablet, Take 180 mg by mouth 2 (two) times daily., Disp: , Rfl:  .  gabapentin (NEURONTIN) 300 MG capsule, Take 300 mg by mouth at bedtime., Disp: , Rfl:  .  levothyroxine (SYNTHROID, LEVOTHROID) 100 MCG tablet, Take 1 tablet (100 mcg total) by mouth daily., Disp: 45 tablet, Rfl: 3 .  midodrine (PROAMATINE) 10 MG tablet, Take 1 tablet (10 mg total) by mouth 3 (three) times  daily with meals., Disp: 90 tablet, Rfl: 3 .  montelukast (SINGULAIR) 10 MG tablet, Take 10 mg by mouth at bedtime. , Disp: , Rfl: 1 .  potassium chloride SA (K-DUR,KLOR-CON) 20 MEQ tablet, Take 3 tablets (60 mEq total) by mouth 2 (two) times daily., Disp: 180 tablet, Rfl: 11 .  simvastatin (ZOCOR) 10 MG tablet, Take 10 mg by mouth every evening. , Disp: , Rfl:  .  spironolactone (ALDACTONE) 25 MG tablet, Take 0.5 tablets (12.5 mg total) by mouth daily., Disp: 45 tablet, Rfl: 3 .  torsemide (DEMADEX) 20 MG tablet, Take 3 tablets (60 mg total) by mouth 2 (two) times daily., Disp: 180 tablet, Rfl: 3 .  zolpidem (AMBIEN) 10 MG tablet, Take 10 mg by mouth at bedtime as needed. For sleep, Disp: , Rfl:  .  acetaZOLAMIDE (DIAMOX) 125 MG tablet, Take 1 tablet (125 mg total) by mouth daily., Disp: 30 tablet, Rfl: 2 .  ALPRAZolam (XANAX) 0.25 MG tablet, , Disp: , Rfl: 0 .  ARIPiprazole (ABILIFY) 2 MG tablet, Take 2 mg by mouth every morning. , Disp: , Rfl:  .  BISACODYL 5 MG EC tablet, TK UTD, Disp: , Rfl: 0 .  clindamycin (CLEOCIN) 300 MG capsule, Take 600 mg by mouth as needed (1 hour prior to dental work)., Disp: , Rfl:  .  febuxostat (ULORIC) 40 MG tablet, Take 40 mg by mouth at bedtime. , Disp: , Rfl:  .  fluticasone (FLONASE) 50 MCG/ACT nasal spray, INSTILL 2 SPRAYS INTO EACH NOSTRIL ONCE DAILY, Disp: , Rfl: 7 .  gabapentin (NEURONTIN) 800 MG tablet, Take 800 mg by mouth as needed., Disp: , Rfl:  .  ipratropium (ATROVENT) 0.03 % nasal spray, INSTILL 2 SPRAYS INTO EACH NOSTRIL TWICE A DAY, Disp: , Rfl: 10 .  metolazone (ZAROXOLYN) 2.5 MG tablet, take 1 tablet by mouth if needed for FLUID OR EDEMA, Disp: 30 tablet, Rfl: 2  Current Facility-Administered Medications:  .  triamcinolone acetonide (KENALOG-40) injection 20 mg, 20 mg, Other, Once, Landis Martins, DPM Allergies  Allergen Reactions  . Ceftin Anaphylaxis    Face and throat swell   . Shellfish Allergy Other (See Comments)    Gout  exacerbation  . Allopurinol Nausea Only and Other (See Comments)    weakness  . Ativan [Lorazepam] Itching  . Lorazepam Itching  . Sulfa Antibiotics Itching  . Ultram [Tramadol Hcl] Itching     Social History   Socioeconomic History  . Marital status: Married    Spouse name: Not on file  . Number of children: 2  . Years of education: masters  . Highest education level: Not on file  Occupational History  . Not on file  Social Needs  . Financial resource strain: Not on file  . Food insecurity:    Worry: Not on file    Inability: Not on file  . Transportation needs:    Medical: Not on file    Non-medical: Not on file  Tobacco Use  . Smoking status: Former Smoker    Packs/day: 0.10    Years: 26.00    Pack years: 2.60    Types: Cigarettes    Last attempt to quit: 05/06/2007    Years since quitting: 10.8  . Smokeless tobacco: Never Used  Substance and Sexual Activity  . Alcohol use: No  . Drug use: No  . Sexual activity: Yes  Lifestyle  . Physical activity:    Days per week: Not on file    Minutes per session: Not on file  . Stress: Not on file  Relationships  . Social connections:    Talks on phone: Not on file    Gets together: Not on file    Attends religious service: Not on file    Active member of club or organization: Not on file    Attends meetings of clubs or organizations: Not on file    Relationship status: Not on file  . Intimate partner violence:    Fear of current or ex partner: Not on file    Emotionally abused: Not on file    Physically abused: Not on file    Forced sexual activity: Not on file  Other Topics Concern  . Not on file  Social History Narrative   Tobacco Use Cigarettes: Former Smoker, Quit in 2008   No Alcohol   No recreational drug use   Diet: Regular/Low Carb   Exercise: None   Occupation: disabled   Education: Research officer, political party, masters   Children: 2   Firearms: No   Therapist, art Use: Always   Former Metallurgist.         Physical Exam  Pulmonary/Chest: Effort normal. No respiratory distress.  Abdominal: Soft.  Skin: Skin is warm and dry. She is not diaphoretic.        Future Appointments  Date Time  Provider [facility]  03/16/2018  3:30 PM Marzetta Board, MD [facility]  03/17/2018 12:55 PM [facility] DEVICE REMOTES [facility]  03/30/2018  1:30 PM [facility] PA/NP [facility] None  04/07/2018  8:05 AM [facility] DEVICE REMOTES [facility]  06/09/2018 10:30 AM Philemon Kingdom, MD [facility] None  07/14/2018  9:00 AM Kandis Nab, PsyD [facility] None  07/14/2018 10:00 AM [facility] TE[facility] None  07/27/2018  8:50 AM Narda Amber K, DO [facility] None  08/01/2018 10:00 AM Kandis Nab, PsyD [facility] None     BP 118/66 (BP Location: Right Arm, Patient Position: Sitting, Cuff Size: Large)   Pulse 85   Resp 16   SpO2 98%   Weight yesterday-195 Last visit weight-196  ATF pt CAO x4 sitting in the kitchen talking with daughter.  Pt denies sob, chest pain and dizziness.  Pt has taken all of her meds this week.  She stated that tried taking all of her meds without assistance this am.  rx bottles and pill box refilled.    Medication ordered: none  Rhonda [name], EMT Paramedic [phone] 03/04/2018    ACTION: Home visit completed

## 2018-03-04 NOTE — Patient Instructions (Signed)
Reduce gabapentin to 300mg  at bedtime  Talk to your therapist and request more frequent visits for coping mechanisms  Return to clinic in March 2020 (after your cognitive testing)

## 2018-03-05 DIAGNOSIS — R296 Repeated falls: Secondary | ICD-10-CM | POA: Diagnosis not present

## 2018-03-05 DIAGNOSIS — D649 Anemia, unspecified: Secondary | ICD-10-CM | POA: Diagnosis not present

## 2018-03-05 DIAGNOSIS — E1142 Type 2 diabetes mellitus with diabetic polyneuropathy: Secondary | ICD-10-CM | POA: Diagnosis not present

## 2018-03-05 DIAGNOSIS — I878 Other specified disorders of veins: Secondary | ICD-10-CM | POA: Diagnosis not present

## 2018-03-05 DIAGNOSIS — I429 Cardiomyopathy, unspecified: Secondary | ICD-10-CM | POA: Diagnosis not present

## 2018-03-05 DIAGNOSIS — M1990 Unspecified osteoarthritis, unspecified site: Secondary | ICD-10-CM | POA: Diagnosis not present

## 2018-03-05 DIAGNOSIS — I110 Hypertensive heart disease with heart failure: Secondary | ICD-10-CM | POA: Diagnosis not present

## 2018-03-05 DIAGNOSIS — I951 Orthostatic hypotension: Secondary | ICD-10-CM | POA: Diagnosis not present

## 2018-03-05 DIAGNOSIS — I5022 Chronic systolic (congestive) heart failure: Secondary | ICD-10-CM | POA: Diagnosis not present

## 2018-03-10 ENCOUNTER — Other Ambulatory Visit (HOSPITAL_COMMUNITY): Payer: Self-pay

## 2018-03-10 ENCOUNTER — Telehealth (HOSPITAL_COMMUNITY): Payer: Self-pay

## 2018-03-10 DIAGNOSIS — I5022 Chronic systolic (congestive) heart failure: Secondary | ICD-10-CM | POA: Diagnosis not present

## 2018-03-10 DIAGNOSIS — I951 Orthostatic hypotension: Secondary | ICD-10-CM | POA: Diagnosis not present

## 2018-03-10 DIAGNOSIS — M1990 Unspecified osteoarthritis, unspecified site: Secondary | ICD-10-CM | POA: Diagnosis not present

## 2018-03-10 DIAGNOSIS — I429 Cardiomyopathy, unspecified: Secondary | ICD-10-CM | POA: Diagnosis not present

## 2018-03-10 DIAGNOSIS — E1142 Type 2 diabetes mellitus with diabetic polyneuropathy: Secondary | ICD-10-CM | POA: Diagnosis not present

## 2018-03-10 DIAGNOSIS — I878 Other specified disorders of veins: Secondary | ICD-10-CM | POA: Diagnosis not present

## 2018-03-10 DIAGNOSIS — I110 Hypertensive heart disease with heart failure: Secondary | ICD-10-CM | POA: Diagnosis not present

## 2018-03-10 DIAGNOSIS — R296 Repeated falls: Secondary | ICD-10-CM | POA: Diagnosis not present

## 2018-03-10 DIAGNOSIS — D649 Anemia, unspecified: Secondary | ICD-10-CM | POA: Diagnosis not present

## 2018-03-10 NOTE — Progress Notes (Signed)
Paramedicine Encounter    Patient ID: Rhonda [name], female    DOB: 1951-02-10, 67 y.o.   MRN: [id number]    Patient Care Team: Maurice Small, MD as PCP - General (Family Medicine)  Patient Active Problem List   Diagnosis Date Noted  . Orthostatic hypotension 07/28/2017  . SVD (spontaneous vaginal delivery)   . Peripheral neuropathy   . On home oxygen therapy   . Migraines   . Left bundle branch block   . Hypothyroidism   . Hypertension   . Hyperlipidemia   . Heart murmur   . GERD (gastroesophageal reflux disease)   . Exertional shortness of breath   . Depression   . Complication of anesthesia   . Back pain   . Arthritis   . Anxiety   . Anemia   . HTN (hypertension) 09/14/2013  . Chronic respiratory failure ([place]) 09/14/2013  . 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 Morbid obesity ([place]) 11/01/2012  . Chronic systolic heart failure ([place]) 10/27/2012  . S/P right TKA 05/10/2012  . Endometrial polyp 01/20/2012  . Breast cancer, stage 1 ([place]) 03/26/2011  . Unspecified vitamin D deficiency 03/26/2011    Current Outpatient Medications:  .  acetaZOLAMIDE (DIAMOX) 125 MG tablet, Take 1 tablet (125 mg total) by mouth daily., Disp: 30 tablet, Rfl: 2 .  ALPRAZolam (XANAX) 0.25 MG tablet, , Disp: , Rfl: 0 .  ARIPiprazole (ABILIFY) 2 MG tablet, Take 2 mg by mouth every morning. , Disp: , Rfl:  .  BISACODYL 5 MG EC tablet, TK UTD, Disp: , Rfl: 0 .  buPROPion (WELLBUTRIN XL) 150 MG 24 hr tablet, Take 450 mg by mouth every morning. , Disp: , Rfl:  .  carvedilol (COREG) 3.125 MG tablet, TAKE 1 TABLET(3.125 MG) BY MOUTH TWICE DAILY WITH A MEAL, Disp: 180 tablet, Rfl: 3 .  citalopram (CELEXA) 10 MG tablet, Take 10 mg by mouth daily. , Disp: , Rfl:  .  clindamycin (CLEOCIN) 300 MG capsule, Take 600 mg by mouth as needed (1 hour prior to dental work)., Disp: , Rfl:  .  clonazePAM (KLONOPIN) 0.5 MG tablet, Take 0.5 mg by mouth 2 (two) times daily. ,  Disp: , Rfl:  .  febuxostat (ULORIC) 40 MG tablet, Take 40 mg by mouth at bedtime. , Disp: , Rfl:  .  fexofenadine (ALLEGRA) 180 MG tablet, Take 180 mg by mouth 2 (two) times daily., Disp: , Rfl:  .  fluticasone (FLONASE) 50 MCG/ACT nasal spray, INSTILL 2 SPRAYS INTO EACH NOSTRIL ONCE DAILY, Disp: , Rfl: 7 .  gabapentin (NEURONTIN) 300 MG capsule, Take 300 mg by mouth at bedtime., Disp: , Rfl:  .  gabapentin (NEURONTIN) 800 MG tablet, Take 800 mg by mouth as needed., Disp: , Rfl:  .  ipratropium (ATROVENT) 0.03 % nasal spray, INSTILL 2 SPRAYS INTO EACH NOSTRIL TWICE A DAY, Disp: , Rfl: 10 .  levothyroxine (SYNTHROID, LEVOTHROID) 100 MCG tablet, Take 1 tablet (100 mcg total) by mouth daily., Disp: 45 tablet, Rfl: 3 .  metolazone (ZAROXOLYN) 2.5 MG tablet, take 1 tablet by mouth if needed for FLUID OR EDEMA, Disp: 30 tablet, Rfl: 2 .  midodrine (PROAMATINE) 10 MG tablet, Take 1 tablet (10 mg total) by mouth 3 (three) times daily with meals., Disp: 90 tablet, Rfl: 3 .  montelukast (SINGULAIR) 10 MG tablet, Take 10 mg by mouth at bedtime. , Disp: , Rfl: 1 .  potassium chloride SA (K-DUR,KLOR-CON) 20 MEQ tablet, Take 3 tablets (  60 mEq total) by mouth 2 (two) times daily., Disp: 180 tablet, Rfl: 11 .  simvastatin (ZOCOR) 10 MG tablet, Take 10 mg by mouth every evening. , Disp: , Rfl:  .  spironolactone (ALDACTONE) 25 MG tablet, Take 0.5 tablets (12.5 mg total) by mouth daily., Disp: 45 tablet, Rfl: 3 .  torsemide (DEMADEX) 20 MG tablet, Take 3 tablets (60 mg total) by mouth 2 (two) times daily., Disp: 180 tablet, Rfl: 3 .  zolpidem (AMBIEN) 10 MG tablet, Take 10 mg by mouth at bedtime as needed. For sleep, Disp: , Rfl:   Current Facility-Administered Medications:  .  triamcinolone acetonide (KENALOG-40) injection 20 mg, 20 mg, Other, Once, Landis Martins, DPM Allergies  Allergen Reactions  . Ceftin Anaphylaxis    Face and throat swell   . Shellfish Allergy Other (See Comments)    Gout exacerbation   . Allopurinol Nausea Only and Other (See Comments)    weakness  . Ativan [Lorazepam] Itching  . Lorazepam Itching  . Sulfa Antibiotics Itching  . Ultram [Tramadol Hcl] Itching     Social History   Socioeconomic History  . Marital status: Married    Spouse name: Not on file  . Number of children: 2  . Years of education: masters  . Highest education level: Not on file  Occupational History  . Not on file  Social Needs  . Financial resource strain: Not on file  . Food insecurity:    Worry: Not on file    Inability: Not on file  . Transportation needs:    Medical: Not on file    Non-medical: Not on file  Tobacco Use  . Smoking status: Former Smoker    Packs/day: 0.10    Years: 26.00    Pack years: 2.60    Types: Cigarettes    Last attempt to quit: 05/06/2007    Years since quitting: 10.8  . Smokeless tobacco: Never Used  Substance and Sexual Activity  . Alcohol use: No  . Drug use: No  . Sexual activity: Yes  Lifestyle  . Physical activity:    Days per week: Not on file    Minutes per session: Not on file  . Stress: Not on file  Relationships  . Social connections:    Talks on phone: Not on file    Gets together: Not on file    Attends religious service: Not on file    Active member of club or organization: Not on file    Attends meetings of clubs or organizations: Not on file    Relationship status: Not on file  . Intimate partner violence:    Fear of current or ex partner: Not on file    Emotionally abused: Not on file    Physically abused: Not on file    Forced sexual activity: Not on file  Other Topics Concern  . Not on file  Social History Narrative   Tobacco Use Cigarettes: Former Smoker, Quit in 2008   No Alcohol   No recreational drug use   Diet: Regular/Low Carb   Exercise: None   Occupation: disabled   Education: Research officer, political party, masters   Children: 2   Firearms: No   Therapist, art Use: Always   Former Metallurgist.        Physical Exam   Constitutional: She appears well-developed.  HENT:  Head: Normocephalic.  Eyes: Pupils are equal, round, and reactive to light.  Neck: Normal range of motion. Neck supple.  Cardiovascular: Normal rate  and regular rhythm.  Pulmonary/Chest: Effort normal and breath sounds normal. No respiratory distress.  Abdominal: Soft. Bowel sounds are normal. She exhibits no distension.  Musculoskeletal: Normal range of motion. She exhibits no edema.  Neurological: She is alert.  Skin: Skin is warm and dry. Capillary refill takes 2 to 3 seconds. She is not diaphoretic.  Psychiatric: She has a normal mood and affect.  Vitals reviewed.       Future Appointments  Date Time Provider [place]  03/16/2018  3:30 PM Marzetta Board, MD [facility]  03/17/2018 12:55 PM [facility] DEVICE REMOTES [facility]  03/30/2018  1:30 PM [facility] PA/NP [facility] None  04/07/2018  8:05 AM [facility] DEVICE REMOTES [facility]  06/09/2018 10:30 AM Philemon Kingdom, MD [facility] None  07/14/2018  9:00 AM Kandis Nab, PsyD [facility] None  07/14/2018 10:00 AM [facility]OPSYCH TECH [facility] None  07/27/2018  8:50 AM Narda Amber K, DO [facility] None  08/01/2018 10:00 AM Kandis Nab, PsyD [facility] None     BP 130/80 (BP Location: Right Arm, Patient Position: Sitting, Cuff Size: Large)   Pulse 80   Resp 18   Wt 199 lb 11.2 oz (90.6 kg)   SpO2 98%   BMI 39.00 kg/m   Weight yesterday-DID NOT WEIGH Last visit weight-195LBS  ATF pt ambulating with a sugical mask on her face. Patient advised she was recently diagnosed with CDIFF. Patient stated she has had a headache with weakness and diarrhea for a few days now but has been taking her medications as prescribed. Vitals were obtained. Patient denied shortness of breath, dizziness or any other pain. Patient's medications reviewed and pill box filled. Vitals obtained and are as noted. Patient encouraged to  weigh daily and to eat heart healthy foods. Patient denied difficulty sleeping or ambulating.    Medication ordered N[place], EMT Paramedic [phone] 03/10/2018    ACTION: Home visit completed Next visit planned for 1 week.

## 2018-03-10 NOTE — Telephone Encounter (Signed)
Called Rhonda [name] to confirm afternoon appointment.

## 2018-03-16 ENCOUNTER — Ambulatory Visit: Payer: Medicare HMO | Admitting: Podiatry

## 2018-03-16 ENCOUNTER — Encounter: Payer: Self-pay | Admitting: Podiatry

## 2018-03-16 ENCOUNTER — Encounter

## 2018-03-16 DIAGNOSIS — B351 Tinea unguium: Secondary | ICD-10-CM

## 2018-03-16 DIAGNOSIS — M79674 Pain in right toe(s): Secondary | ICD-10-CM

## 2018-03-16 DIAGNOSIS — M79675 Pain in left toe(s): Secondary | ICD-10-CM | POA: Diagnosis not present

## 2018-03-16 NOTE — Progress Notes (Signed)
Subjective: Rhonda [name] is a 67 yo AAF who presents today with cc of painful, elongated toenails b/l feet. She has b/l unna boots on today and gets them changed weekly by a visiting nurse.  Physical Exam General: The patient is alert and oriented x3 in no acute distress.  Vascular: Dorsalis Pedis pulse and Posterior Tibial pulse are 1/4 bilateral. Capillary fill time is immediate to all digits.   Dermatology: Skin is warm, dry and supple bilateral lower extremities. Nails 1-10 are normal. There is no erythema, edema, no eccymosis, no open lesions present. Integument is otherwise unremarkable.   Neurological:  Sensation intact b/l  Musculoskeletal:  No pain with compression of calcaneus bilateral. No pain with tuning fork to calcaneus bilateral. No pain with calf compression bilateral. There is decreased Ankle joint range of motion bilateral. All other joints range of motion within normal limits bilateral. Severe pes planus. Strength 5/5 in all groups bilateral.   Assessment: 1. Painful onychomycosis 1-5 b/l 2. Diabetic neuropathy b/l  Plan: Toenails debrided in length and girth. Information dispensed on peripheral neuropathy and moisturizers Continue soft, supportive shoe gear daily Report any pedal injuries to Nursing  Follow up 9-12 weeks. Call office should there be any concerns in the interim.

## 2018-03-16 NOTE — Patient Instructions (Signed)
Try one of these moisturizers for the dry skin on your feet-  1) Cetaphil  2) CeraVe  3) Eucerin  4) Aquaphor   Peripheral Neuropathy Peripheral neuropathy is a type of nerve damage. It affects nerves that carry signals between the spinal cord and other parts of the body. These are called peripheral nerves. With peripheral neuropathy, one nerve or a group of nerves may be damaged. What are the causes? Many things can damage peripheral nerves. For some people with peripheral neuropathy, the cause is unknown. Some causes include:  Diabetes. This is the most common cause of peripheral neuropathy.  Injury to a nerve.  Pressure or stress on a nerve that lasts a long time.  Too little vitamin B. Alcoholism can lead to this.  Infections.  Autoimmune diseases, such as multiple sclerosis and systemic lupus erythematosus.  Inherited nerve diseases.  Some medicines, such as cancer drugs.  Toxic substances, such as lead and mercury.  Too little blood flowing to the legs.  Kidney disease.  Thyroid disease.  What are the signs or symptoms? Different people have different symptoms. The symptoms you have will depend on which of your nerves is damaged. Common symptoms include:  Loss of feeling (numbness) in the feet and hands.  Tingling in the feet and hands.  Pain that burns.  Very sensitive skin.  Weakness.  Not being able to move a part of the body (paralysis).  Muscle twitching.  Clumsiness or poor coordination.  Loss of balance.  Not being able to control your bladder.  Feeling dizzy.  Sexual problems.  How is this diagnosed? Peripheral neuropathy is a symptom, not a disease. Finding the cause of peripheral neuropathy can be hard. To figure that out, your health care provider will take a medical history and do a physical exam. A neurological exam will also be done. This involves checking things affected by your brain, spinal cord, and nerves (nervous system).  For example, your health care provider will check your reflexes, how you move, and what you can feel. Other types of tests may also be ordered, such as:  Blood tests.  A test of the fluid in your spinal cord.  Imaging tests, such as CT scans or an MRI.  Electromyography (EMG). This test checks the nerves that control muscles.  Nerve conduction velocity tests. These tests check how fast messages pass through your nerves.  Nerve biopsy. A small piece of nerve is removed. It is then checked under a microscope.  How is this treated?  Medicine is often used to treat peripheral neuropathy. Medicines may include: ? Pain-relieving medicines. Prescription or over-the-counter medicine may be suggested. ? Antiseizure medicine. This may be used for pain. ? Antidepressants. These also may help ease pain from neuropathy. ? Lidocaine. This is a numbing medicine. You might wear a patch or be given a shot. ? Mexiletine. This medicine is typically used to help control irregular heart rhythms.  Surgery. Surgery may be needed to relieve pressure on a nerve or to destroy a nerve that is causing pain.  Physical therapy to help movement.  Assistive devices to help movement. Follow these instructions at home:  Only take over-the-counter or prescription medicines as directed by your health care provider. Follow the instructions carefully for any given medicines. Do not take any other medicines without first getting approval from your health care provider.  If you have diabetes, work closely with your health care provider to keep your blood sugar under control.  If you have  numbness in your feet: ? Check every day for signs of injury or infection. Watch for redness, warmth, and swelling. ? Wear padded socks and comfortable shoes. These help protect your feet.  Do not do things that put pressure on your damaged nerve.  Do not smoke. Smoking keeps blood from getting to damaged nerves.  Avoid or limit  alcohol. Too much alcohol can cause a lack of B vitamins. These vitamins are needed for healthy nerves.  Develop a good support system. Coping with peripheral neuropathy can be stressful. Talk to a mental health specialist or join a support group if you are struggling.  Follow up with your health care provider as directed. Contact a health care provider if:  You have new signs or symptoms of peripheral neuropathy.  You are struggling emotionally from dealing with peripheral neuropathy.  You have a fever. Get help right away if:  You have an injury or infection that is not healing.  You feel very dizzy or begin vomiting.  You have chest pain.  You have trouble breathing. This information is not intended to replace advice given to you by your health care provider. Make sure you discuss any questions you have with your health care provider. Document Released: 05/01/2002 Document Revised: 10/17/2015 Document Reviewed: 01/16/2013 Elsevier Interactive Patient Education  2017 [employer].

## 2018-03-17 ENCOUNTER — Telehealth (HOSPITAL_COMMUNITY): Payer: Self-pay

## 2018-03-17 ENCOUNTER — Encounter (HOSPITAL_COMMUNITY): Payer: Self-pay

## 2018-03-17 ENCOUNTER — Ambulatory Visit (INDEPENDENT_AMBULATORY_CARE_PROVIDER_SITE_OTHER): Payer: Medicare HMO

## 2018-03-17 ENCOUNTER — Other Ambulatory Visit (HOSPITAL_COMMUNITY): Payer: Self-pay

## 2018-03-17 DIAGNOSIS — M1990 Unspecified osteoarthritis, unspecified site: Secondary | ICD-10-CM | POA: Diagnosis not present

## 2018-03-17 DIAGNOSIS — R296 Repeated falls: Secondary | ICD-10-CM | POA: Diagnosis not present

## 2018-03-17 DIAGNOSIS — D649 Anemia, unspecified: Secondary | ICD-10-CM | POA: Diagnosis not present

## 2018-03-17 DIAGNOSIS — Z95810 Presence of automatic (implantable) cardiac defibrillator: Secondary | ICD-10-CM

## 2018-03-17 DIAGNOSIS — E1142 Type 2 diabetes mellitus with diabetic polyneuropathy: Secondary | ICD-10-CM | POA: Diagnosis not present

## 2018-03-17 DIAGNOSIS — I5032 Chronic diastolic (congestive) heart failure: Secondary | ICD-10-CM

## 2018-03-17 DIAGNOSIS — I878 Other specified disorders of veins: Secondary | ICD-10-CM | POA: Diagnosis not present

## 2018-03-17 DIAGNOSIS — I110 Hypertensive heart disease with heart failure: Secondary | ICD-10-CM | POA: Diagnosis not present

## 2018-03-17 DIAGNOSIS — I5022 Chronic systolic (congestive) heart failure: Secondary | ICD-10-CM | POA: Diagnosis not present

## 2018-03-17 DIAGNOSIS — I951 Orthostatic hypotension: Secondary | ICD-10-CM | POA: Diagnosis not present

## 2018-03-17 DIAGNOSIS — I429 Cardiomyopathy, unspecified: Secondary | ICD-10-CM | POA: Diagnosis not present

## 2018-03-17 NOTE — Telephone Encounter (Signed)
Pt called to confirm today's [facility] appointment.   

## 2018-03-17 NOTE — Progress Notes (Signed)
EPIC Encounter for ICM Monitoring  Patient Name: Rhonda [name] is a 67 y.o. female Date: 03/17/2018 Primary Care Physican: Maurice Small, MD Primary Cardiologist:Bensimhon Electrophysiologist: Lovena Le Dry Weight:195lbs Bi-V Pacing: 100%         Heart Failure questions reviewed, pt asymptomatic for fluid symptoms but not feeling well.  She has some GI issues.   Thoracic impedance abnormal suggesting fluid accumulation starting 02/10/2018 but has shown improvement.   Prescribed: Torsemide 20 mg3tablets (60 mg total)twice a day.Can take extra as needed per 01/18/2018 office note. Potassium 20 meq 3tablets (60 mEq total)twice a day. Metolazone 2.52m 1 tablet by mouth if needed for fluid or edema.  Labs: 02/10/2018 Creatinine 1.85, BUN 41, Potassium 3.9, Sodium 138, EGFR 28-33 10/29/2017 Creatinine 1.45, BUN 24, Potassium 3.9, Sodium 141, EGFR 37-43 10/21/2017 Creatinine 1.57, BUN 19, Potassium 3.8, Sodium 140, EGFR 34-39 09/23/2017 Creatinine 1.26, BUN 29, Potassium 3.2, Sodium 142, EGFR 49-56 09/09/2017 Creatinine 1.22, BUN 20, Potassium 3.4, Sodium 140, EGFR 46-53 08/25/2017 Creatinine 1.52, BUN 47, Potassium 3.1, Sodium 137, EGFR 35-41 07/28/2017 Creatinine 1.69, BUN 53, Potassium 3.0, Sodium 139, EGFR 31-36 (low potassium addressed by HF clinic) 06/21/2017 Creatinine 2.06, BUN 38, Potassium 2.5, Sodium 136, EGFR 24-28  Recommendations: No changes.   Encouraged to call for fluid symptoms.  Follow-up plan: ICM clinic phone appointment on 04/18/2018.   Office appointment scheduled 03/30/2018 with HF Clinic NP/PA.    Copy of ICM check sent to Dr. TLovena Leand Dr BHaroldine Laws   3 month ICM trend: 03/17/2018    1 Year ICM trend:       [name] RN 03/17/2018 1:55 PM

## 2018-03-17 NOTE — Progress Notes (Signed)
Paramedicine Encounter    Patient ID: Rhonda [name], female    DOB: 17-Mar-1951, 67 y.o.   MRN: [id number]    Patient Care Team: Maurice Small, MD as PCP - General (Family Medicine)  Patient Active Problem List   Diagnosis Date Noted  . Orthostatic hypotension 07/28/2017  . SVD (spontaneous vaginal delivery)   . Peripheral neuropathy   . On home oxygen therapy   . Migraines   . Left bundle branch block   . Hypothyroidism   . Hypertension   . Hyperlipidemia   . Heart murmur   . GERD (gastroesophageal reflux disease)   . Exertional shortness of breath   . Depression   . Complication of anesthesia   . Back pain   . Arthritis   . Anxiety   . Anemia   . HTN (hypertension) 09/14/2013  . Chronic respiratory failure ([place]) 09/14/2013  . 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 Morbid obesity ([place]) 11/01/2012  . Chronic systolic heart failure ([place]) 10/27/2012  . S/P right TKA 05/10/2012  . Endometrial polyp 01/20/2012  . Breast cancer, stage 1 ([place]) 03/26/2011  . Unspecified vitamin D deficiency 03/26/2011    Current Outpatient Medications:  .  acetaZOLAMIDE (DIAMOX) 125 MG tablet, Take 1 tablet (125 mg total) by mouth daily., Disp: 30 tablet, Rfl: 2 .  ALPRAZolam (XANAX) 0.25 MG tablet, , Disp: , Rfl: 0 .  ARIPiprazole (ABILIFY) 2 MG tablet, Take 2 mg by mouth every morning. , Disp: , Rfl:  .  BISACODYL 5 MG EC tablet, TK UTD, Disp: , Rfl: 0 .  buPROPion (WELLBUTRIN XL) 150 MG 24 hr tablet, Take 450 mg by mouth every morning. , Disp: , Rfl:  .  carvedilol (COREG) 3.125 MG tablet, TAKE 1 TABLET(3.125 MG) BY MOUTH TWICE DAILY WITH A MEAL, Disp: 180 tablet, Rfl: 3 .  citalopram (CELEXA) 10 MG tablet, Take 10 mg by mouth daily. , Disp: , Rfl:  .  clindamycin (CLEOCIN) 300 MG capsule, Take 600 mg by mouth as needed (1 hour prior to dental work)., Disp: , Rfl:  .  clonazePAM (KLONOPIN) 0.5 MG tablet, Take 0.5 mg by mouth 2 (two) times daily. ,  Disp: , Rfl:  .  febuxostat (ULORIC) 40 MG tablet, Take 40 mg by mouth at bedtime. , Disp: , Rfl:  .  fexofenadine (ALLEGRA) 180 MG tablet, Take 180 mg by mouth 2 (two) times daily., Disp: , Rfl:  .  fluticasone (FLONASE) 50 MCG/ACT nasal spray, INSTILL 2 SPRAYS INTO EACH NOSTRIL ONCE DAILY, Disp: , Rfl: 7 .  gabapentin (NEURONTIN) 300 MG capsule, Take 300 mg by mouth at bedtime., Disp: , Rfl:  .  gabapentin (NEURONTIN) 800 MG tablet, Take 800 mg by mouth as needed., Disp: , Rfl:  .  ipratropium (ATROVENT) 0.03 % nasal spray, INSTILL 2 SPRAYS INTO EACH NOSTRIL TWICE A DAY, Disp: , Rfl: 10 .  levothyroxine (SYNTHROID, LEVOTHROID) 100 MCG tablet, Take 1 tablet (100 mcg total) by mouth daily., Disp: 45 tablet, Rfl: 3 .  metolazone (ZAROXOLYN) 2.5 MG tablet, take 1 tablet by mouth if needed for FLUID OR EDEMA, Disp: 30 tablet, Rfl: 2 .  metroNIDAZOLE (FLAGYL) 500 MG tablet, TK 1 T PO TID FOR 10 DAYS, Disp: , Rfl: 0 .  midodrine (PROAMATINE) 10 MG tablet, Take 1 tablet (10 mg total) by mouth 3 (three) times daily with meals., Disp: 90 tablet, Rfl: 3 .  montelukast (SINGULAIR) 10 MG tablet, Take 10 mg by mouth  at bedtime. , Disp: , Rfl: 1 .  potassium chloride SA (K-DUR,KLOR-CON) 20 MEQ tablet, Take 3 tablets (60 mEq total) by mouth 2 (two) times daily., Disp: 180 tablet, Rfl: 11 .  simvastatin (ZOCOR) 10 MG tablet, Take 10 mg by mouth every evening. , Disp: , Rfl:  .  spironolactone (ALDACTONE) 25 MG tablet, Take 0.5 tablets (12.5 mg total) by mouth daily., Disp: 45 tablet, Rfl: 3 .  torsemide (DEMADEX) 20 MG tablet, Take 3 tablets (60 mg total) by mouth 2 (two) times daily., Disp: 180 tablet, Rfl: 3 .  zolpidem (AMBIEN) 10 MG tablet, Take 10 mg by mouth at bedtime as needed. For sleep, Disp: , Rfl:   Current Facility-Administered Medications:  .  triamcinolone acetonide (KENALOG-40) injection 20 mg, 20 mg, Other, Once, Landis Martins, DPM Allergies  Allergen Reactions  . Ceftin Anaphylaxis     Face and throat swell   . Shellfish Allergy Other (See Comments)    Gout exacerbation  . Allopurinol Nausea Only and Other (See Comments)    weakness  . Ativan [Lorazepam] Itching  . Lorazepam Itching  . Sulfa Antibiotics Itching  . Ultram [Tramadol Hcl] Itching     Social History   Socioeconomic History  . Marital status: Married    Spouse name: Not on file  . Number of children: 2  . Years of education: masters  . Highest education level: Not on file  Occupational History  . Not on file  Social Needs  . Financial resource strain: Not on file  . Food insecurity:    Worry: Not on file    Inability: Not on file  . Transportation needs:    Medical: Not on file    Non-medical: Not on file  Tobacco Use  . Smoking status: Former Smoker    Packs/day: 0.10    Years: 26.00    Pack years: 2.60    Types: Cigarettes    Last attempt to quit: 05/06/2007    Years since quitting: 10.8  . Smokeless tobacco: Never Used  Substance and Sexual Activity  . Alcohol use: No  . Drug use: No  . Sexual activity: Yes  Lifestyle  . Physical activity:    Days per week: Not on file    Minutes per session: Not on file  . Stress: Not on file  Relationships  . Social connections:    Talks on phone: Not on file    Gets together: Not on file    Attends religious service: Not on file    Active member of club or organization: Not on file    Attends meetings of clubs or organizations: Not on file    Relationship status: Not on file  . Intimate partner violence:    Fear of current or ex partner: Not on file    Emotionally abused: Not on file    Physically abused: Not on file    Forced sexual activity: Not on file  Other Topics Concern  . Not on file  Social History Narrative   Tobacco Use Cigarettes: Former Smoker, Quit in 2008   No Alcohol   No recreational drug use   Diet: Regular/Low Carb   Exercise: None   Occupation: disabled   Education: Research officer, political party, masters   Children: 2    Firearms: No   Therapist, art Use: Always   Former Metallurgist.        Physical Exam  Pulmonary/Chest: Effort normal. No respiratory distress.  Abdominal: Soft.  Musculoskeletal: She  exhibits edema.  Skin: Skin is warm and dry. She is not diaphoretic.        Future Appointments  Date Time Provider [place]  03/30/2018  1:30 PM [facility] PA/NP [facility] None  04/18/2018  4:10 PM [facility] DEVICE REMOTES [facility]  06/09/2018 10:30 AM Philemon Kingdom, MD [facility] None  06/15/2018  1:45 PM Marzetta Board, MD [facility]  07/14/2018  9:00 AM Kandis Nab, PsyD [facility] None  07/14/2018 10:00 AM [facility] TE[facility] None  07/27/2018  8:50 AM Narda Amber K, DO [facility] None  08/01/2018 10:00 AM Kandis Nab, PsyD [facility] None     BP 126/68 (BP Location: Right Arm, Patient Position: Sitting, Cuff Size: Large)   Pulse 66   Resp 12   Wt 194 lb 9.6 oz (88.3 kg)   SpO2 96%   BMI 38.01 kg/m   Weight yesterday-196 Last visit weight-199  ATF pt CAO x4 sitting in the living room. Pt still having symptoms of C-diff, although she will be completing the antibiotics tomorrow.  Pt stated that she has not been able to eat except broth.  Pt has taken most of her meds except about 4 night meds that includes, sleep aides, b/p meds.  Pt denies sob and chest pain. rx bottles verified and pill box refilled.  Pt's meds was placed to the side in a sandwich bag due to her mixing up her meds or misplacing them at times.     Pt is still getting her legs wrapped weekly.  Medication ordered: none Ayan Yankey, EMT Paramedic [phone] 03/17/2018    ACTION: Home visit completed

## 2018-03-18 ENCOUNTER — Telehealth (HOSPITAL_COMMUNITY): Payer: Self-pay

## 2018-03-18 NOTE — Telephone Encounter (Signed)
Rhonda [name] from [facility] called to state that pt fell twice on 10/24 and scraped her nose and hurt her ankle. Her bp was 135/90 and HR 86 on 10/25. Pt's daughter claims pt is disoriented. Please advise.

## 2018-03-18 NOTE — Telephone Encounter (Signed)
She should probably be evaluated in ER since she fell and hit her face and is now disoriented.

## 2018-03-21 ENCOUNTER — Encounter: Payer: Self-pay | Admitting: Podiatry

## 2018-03-21 NOTE — Telephone Encounter (Signed)
Left VM for pt 

## 2018-03-24 ENCOUNTER — Telehealth (HOSPITAL_COMMUNITY): Payer: Self-pay

## 2018-03-24 DIAGNOSIS — E119 Type 2 diabetes mellitus without complications: Secondary | ICD-10-CM | POA: Diagnosis not present

## 2018-03-24 DIAGNOSIS — H35361 Drusen (degenerative) of macula, right eye: Secondary | ICD-10-CM | POA: Diagnosis not present

## 2018-03-24 DIAGNOSIS — I951 Orthostatic hypotension: Secondary | ICD-10-CM | POA: Diagnosis not present

## 2018-03-24 DIAGNOSIS — H04123 Dry eye syndrome of bilateral lacrimal glands: Secondary | ICD-10-CM | POA: Diagnosis not present

## 2018-03-24 DIAGNOSIS — I110 Hypertensive heart disease with heart failure: Secondary | ICD-10-CM | POA: Diagnosis not present

## 2018-03-24 DIAGNOSIS — I5022 Chronic systolic (congestive) heart failure: Secondary | ICD-10-CM | POA: Diagnosis not present

## 2018-03-24 DIAGNOSIS — R296 Repeated falls: Secondary | ICD-10-CM | POA: Diagnosis not present

## 2018-03-24 DIAGNOSIS — I429 Cardiomyopathy, unspecified: Secondary | ICD-10-CM | POA: Diagnosis not present

## 2018-03-24 DIAGNOSIS — D649 Anemia, unspecified: Secondary | ICD-10-CM | POA: Diagnosis not present

## 2018-03-24 DIAGNOSIS — I878 Other specified disorders of veins: Secondary | ICD-10-CM | POA: Diagnosis not present

## 2018-03-24 DIAGNOSIS — M1990 Unspecified osteoarthritis, unspecified site: Secondary | ICD-10-CM | POA: Diagnosis not present

## 2018-03-24 DIAGNOSIS — E1142 Type 2 diabetes mellitus with diabetic polyneuropathy: Secondary | ICD-10-CM | POA: Diagnosis not present

## 2018-03-24 NOTE — Telephone Encounter (Signed)
Pt called to confirm today's [facility] visit; no answer to home phone voice mail full.  I called the cell number which went straight to voice mail; message left on that number.

## 2018-03-25 ENCOUNTER — Other Ambulatory Visit (HOSPITAL_COMMUNITY): Payer: Self-pay

## 2018-03-25 ENCOUNTER — Encounter (HOSPITAL_COMMUNITY): Payer: Self-pay

## 2018-03-25 NOTE — Progress Notes (Signed)
. Paramedicine Encounter    Patient ID: Rhonda [name], female    DOB: 02/13/1951, 67 y.o.   MRN: [id number]    Patient Care Team: Maurice Small, MD as PCP - General (Family Medicine)  Patient Active Problem List   Diagnosis Date Noted  . Orthostatic hypotension 07/28/2017  . SVD (spontaneous vaginal delivery)   . Peripheral neuropathy   . On home oxygen therapy   . Migraines   . Left bundle branch block   . Hypothyroidism   . Hypertension   . Hyperlipidemia   . Heart murmur   . GERD (gastroesophageal reflux disease)   . Exertional shortness of breath   . Depression   . Complication of anesthesia   . Back pain   . Arthritis   . Anxiety   . Anemia   . HTN (hypertension) 09/14/2013  . Chronic respiratory failure ([place]) 09/14/2013  . 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 Morbid obesity ([place]) 11/01/2012  . Chronic systolic heart failure ([place]) 10/27/2012  . S/P right TKA 05/10/2012  . Endometrial polyp 01/20/2012  . Breast cancer, stage 1 ([place]) 03/26/2011  . Unspecified vitamin D deficiency 03/26/2011    Current Outpatient Medications:  .  ALPRAZolam (XANAX) 0.25 MG tablet, , Disp: , Rfl: 0 .  buPROPion (WELLBUTRIN XL) 150 MG 24 hr tablet, Take 450 mg by mouth every morning. , Disp: , Rfl:  .  carvedilol (COREG) 3.125 MG tablet, TAKE 1 TABLET(3.125 MG) BY MOUTH TWICE DAILY WITH A MEAL, Disp: 180 tablet, Rfl: 3 .  citalopram (CELEXA) 10 MG tablet, Take 10 mg by mouth daily. , Disp: , Rfl:  .  clonazePAM (KLONOPIN) 0.5 MG tablet, Take 0.5 mg by mouth 2 (two) times daily. , Disp: , Rfl:  .  febuxostat (ULORIC) 40 MG tablet, Take 40 mg by mouth at bedtime. , Disp: , Rfl:  .  fexofenadine (ALLEGRA) 180 MG tablet, Take 180 mg by mouth 2 (two) times daily., Disp: , Rfl:  .  gabapentin (NEURONTIN) 300 MG capsule, Take 300 mg by mouth at bedtime., Disp: , Rfl:  .  ipratropium (ATROVENT) 0.03 % nasal spray, INSTILL 2 SPRAYS INTO EACH NOSTRIL  TWICE A DAY, Disp: , Rfl: 10 .  levothyroxine (SYNTHROID, LEVOTHROID) 100 MCG tablet, Take 1 tablet (100 mcg total) by mouth daily., Disp: 45 tablet, Rfl: 3 .  midodrine (PROAMATINE) 10 MG tablet, Take 1 tablet (10 mg total) by mouth 3 (three) times daily with meals., Disp: 90 tablet, Rfl: 3 .  montelukast (SINGULAIR) 10 MG tablet, Take 10 mg by mouth at bedtime. , Disp: , Rfl: 1 .  potassium chloride SA (K-DUR,KLOR-CON) 20 MEQ tablet, Take 3 tablets (60 mEq total) by mouth 2 (two) times daily., Disp: 180 tablet, Rfl: 11 .  simvastatin (ZOCOR) 10 MG tablet, Take 10 mg by mouth every evening. , Disp: , Rfl:  .  spironolactone (ALDACTONE) 25 MG tablet, Take 0.5 tablets (12.5 mg total) by mouth daily., Disp: 45 tablet, Rfl: 3 .  torsemide (DEMADEX) 20 MG tablet, Take 3 tablets (60 mg total) by mouth 2 (two) times daily., Disp: 180 tablet, Rfl: 3 .  acetaZOLAMIDE (DIAMOX) 125 MG tablet, Take 1 tablet (125 mg total) by mouth daily., Disp: 30 tablet, Rfl: 2 .  ARIPiprazole (ABILIFY) 2 MG tablet, Take 2 mg by mouth every morning. , Disp: , Rfl:  .  BISACODYL 5 MG EC tablet, TK UTD, Disp: , Rfl: 0 .  clindamycin (CLEOCIN) 300 MG  capsule, Take 600 mg by mouth as needed (1 hour prior to dental work)., Disp: , Rfl:  .  fluticasone (FLONASE) 50 MCG/ACT nasal spray, INSTILL 2 SPRAYS INTO EACH NOSTRIL ONCE DAILY, Disp: , Rfl: 7 .  gabapentin (NEURONTIN) 800 MG tablet, Take 800 mg by mouth as needed., Disp: , Rfl:  .  metolazone (ZAROXOLYN) 2.5 MG tablet, take 1 tablet by mouth if needed for FLUID OR EDEMA, Disp: 30 tablet, Rfl: 2 .  metroNIDAZOLE (FLAGYL) 500 MG tablet, TK 1 T PO TID FOR 10 DAYS, Disp: , Rfl: 0 .  zolpidem (AMBIEN) 10 MG tablet, Take 10 mg by mouth at bedtime as needed. For sleep, Disp: , Rfl:   Current Facility-Administered Medications:  .  triamcinolone acetonide (KENALOG-40) injection 20 mg, 20 mg, Other, Once, Landis Martins, DPM Allergies  Allergen Reactions  . Ceftin Anaphylaxis     Face and throat swell   . Shellfish Allergy Other (See Comments)    Gout exacerbation  . Allopurinol Nausea Only and Other (See Comments)    weakness  . Ativan [Lorazepam] Itching  . Lorazepam Itching  . Sulfa Antibiotics Itching  . Ultram [Tramadol Hcl] Itching     Social History   Socioeconomic History  . Marital status: Married    Spouse name: Not on file  . Number of children: 2  . Years of education: masters  . Highest education level: Not on file  Occupational History  . Not on file  Social Needs  . Financial resource strain: Not on file  . Food insecurity:    Worry: Not on file    Inability: Not on file  . Transportation needs:    Medical: Not on file    Non-medical: Not on file  Tobacco Use  . Smoking status: Former Smoker    Packs/day: 0.10    Years: 26.00    Pack years: 2.60    Types: Cigarettes    Last attempt to quit: 05/06/2007    Years since quitting: 10.8  . Smokeless tobacco: Never Used  Substance and Sexual Activity  . Alcohol use: No  . Drug use: No  . Sexual activity: Yes  Lifestyle  . Physical activity:    Days per week: Not on file    Minutes per session: Not on file  . Stress: Not on file  Relationships  . Social connections:    Talks on phone: Not on file    Gets together: Not on file    Attends religious service: Not on file    Active member of club or organization: Not on file    Attends meetings of clubs or organizations: Not on file    Relationship status: Not on file  . Intimate partner violence:    Fear of current or ex partner: Not on file    Emotionally abused: Not on file    Physically abused: Not on file    Forced sexual activity: Not on file  Other Topics Concern  . Not on file  Social History Narrative   Tobacco Use Cigarettes: Former Smoker, Quit in 2008   No Alcohol   No recreational drug use   Diet: Regular/Low Carb   Exercise: None   Occupation: disabled   Education: Research officer, political party, masters   Children: 2    Firearms: No   Therapist, art Use: Always   Former Metallurgist.        Physical Exam  Pulmonary/Chest: Effort normal. No respiratory distress.  Abdominal: Soft. There is  no tenderness. There is no guarding.  Skin: Skin is warm and dry. She is not diaphoretic.        Future Appointments  Date Time Provider [place]  03/30/2018  1:30 PM [facility] PA/NP [facility] None  04/18/2018  4:10 PM [facility] DEVICE REMOTES [facility]  06/09/2018 10:30 AM Philemon Kingdom, MD [facility] None  06/15/2018  1:45 PM Marzetta Board, MD [facility]  07/14/2018  9:00 AM Kandis Nab, PsyD [facility] None  07/14/2018 10:00 AM [facility] TE[facility] None  07/27/2018  8:50 AM Narda Amber K, DO [facility] None  08/01/2018 10:00 AM Kandis Nab, PsyD [facility] None     BP 140/82 (BP Location: Right Arm, Patient Position: Sitting, Cuff Size: Large)   Pulse 72   Resp 16   Wt 192 lb 6.4 oz (87.3 kg)   SpO2 98%   BMI 37.58 kg/m   Weight yesterday-194 Last visit weight-194  ATF pt CAO x4 sleeping on the couch with no complaints.  Pt had taken some of her meds this am (only the depression meds per pt).  Pt denies sob, dizziness, and chest pain.  rx bottles verified and pill box refilled.     Medication ordered: Clonazepam --filled until sat am  Wynette Jersey, EMT Paramedic [phone] 03/25/2018    ACTION: Home visit completed

## 2018-03-29 ENCOUNTER — Telehealth: Payer: Self-pay | Admitting: Psychology

## 2018-03-29 NOTE — Telephone Encounter (Signed)
Dr Bailar is leaving for a new job closer to home we have mailed a letter to the patient to inform them that we canceled all appointments with Dr Bailar. We thank you for the understanding °

## 2018-03-29 NOTE — Progress Notes (Signed)
ADVANCED HF CLINIC NOTE  Patient ID: Rhonda [name], female   DOB: 1950/10/12, 67 y.o.   MRN: [id number]  Primary Cardiologist: Dr Radford Pax  General Surgeon: Dr Donne Hazel  Orthopedic: Dr Alvan Dame  PCP: Dr Justin Mend Referring MD: Dr Radford Pax  HPI: Rhonda [name] is a 67 y.o. 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 She has had sleep study with no  evidence of sleep apnea.  She has a Medtronic CRT-D device.  Last echo in 7/18 showed recovery of EF to 55-60%.   She returns today for regular follow up. Last visit, she was orthostatic. Torsemide was decreased and midodrine was increased. She was also referred to HF paramedicine. Dr [name] saw her 10/11 and was concerned for ?frontal lobe dysfunction and depression contributing to her cognitive impairment. Overall doing about the same. She has been treated recently for cdiff. Still having some diarrhea. Weight is down another 17 lbs since August. She remains dizzy every time she stands. She noticed no difference increasing midodrine and cutting back torsemide. She remains SOB after walking short distances. Sleeps sitting up at baseline. She remains fatigued with poor appetite. She fell 2x in the last month, which is an improvement for her. Drinks 3-4 L fluid/day. Taking all medications.    Optivol: Thoracic impedence below threshold, but trending up. No VT/VF. No AF/AT. Active 1-2 hours/day.   10/14/12 ECHO EF 35% RV ok  04/12/13 ECHO EF 20-25%, LV moderately dilated 10/11/2013 ECHO EF 45-50% 7/18 ECHO EF 55-60%, normal RV size and systolic function, PASP 34 mmHg  11/24/12 Creatinine 1.23 Potassium 4.1 12/15/12 Creatinine 2.03 Potassium  3.3  12/22/12 Creatinine 1.2 Potassium 3.8 02/23/13 Creatinine 1.68 K 4.4  05/02/13 K 4.2 Creatinine 1.8  10/11/13 K 3.7 Creatinine 1.69 10/17 K 4.1, creatinine 1.35 09/2017: K3.8 Creatinine 1.57  Review of systems complete and found to be negative unless listed in HPI.   Past Medical History:  Diagnosis Date  . Anemia   . Anxiety   . Arthritis    RIGHT KNEE ARTHRITIS AND PAIN-PT TOLD BONE ON BONE  . Asthma   . Back pain    DISK PROBLEM  . Breast cancer, stage 1 ([place]) 03/26/2011   left-FINISHED CHEMO  AND RADIATION  . Cardiomyopathy    [name]  . Chronic systolic heart failure ([facility])    a) NICM b) ECHO (03/2013) EF 20-25% c) ECHO (09/2013) EF 45-50%, grade I DD  . Complication of anesthesia    PT STATES HER B/P LOW AFTER ONE OF HER SURGERIES--SHE ATTRIBUTES TO LYING FLAT  . Depression   . Diabetes mellitus    "diet controlled" (05/04/2103)  . Exertional shortness of breath   . GERD (gastroesophageal reflux disease)   . Gout   . Heart murmur   . Hyperlipidemia   . Hypertension   . Hypothyroidism   . Left bundle branch block    s/p CRT-D (04/2013)  . Migraines   . On home oxygen therapy    "2L suppose to be q night" (05/03/2013)  . Peripheral neuropathy    feet  . SVD (spontaneous vaginal delivery)    x 2  . Unspecified vitamin D deficiency  03/26/2011   does not take meds    Current Outpatient Medications  Medication Sig Dispense Refill  . acetaZOLAMIDE (DIAMOX) 125 MG tablet Take 1 tablet (125 mg total) by mouth daily. 30 tablet 2  . ALPRAZolam (XANAX) 0.25 MG tablet   0  . BISACODYL 5 MG EC tablet TK UTD  0  . buPROPion (WELLBUTRIN XL) 150 MG 24 hr tablet Take 450 mg by mouth every morning.     . carvedilol (COREG) 3.125 MG tablet TAKE 1 TABLET(3.125 MG) BY MOUTH TWICE DAILY WITH A MEAL 180 tablet 3  . citalopram (CELEXA) 10 MG tablet Take 10 mg by mouth daily.     . clonazePAM (KLONOPIN) 0.5 MG tablet Take 1 mg by mouth daily.     .  fexofenadine (ALLEGRA) 180 MG tablet Take 180 mg by mouth 2 (two) times daily.    . fluticasone (FLONASE) 50 MCG/ACT nasal spray INSTILL 2 SPRAYS INTO EACH NOSTRIL ONCE DAILY  7  . gabapentin (NEURONTIN) 300 MG capsule Take 300 mg by mouth 3 (three) times daily.     Marland Kitchen gabapentin (NEURONTIN) 800 MG tablet Take 800 mg by mouth as needed.    Marland Kitchen ipratropium (ATROVENT) 0.03 % nasal spray INSTILL 2 SPRAYS INTO EACH NOSTRIL TWICE A DAY  10  . levothyroxine (SYNTHROID, LEVOTHROID) 100 MCG tablet Take 1 tablet (100 mcg total) by mouth daily. 45 tablet 3  . metolazone (ZAROXOLYN) 2.5 MG tablet take 1 tablet by mouth if needed for FLUID OR EDEMA 30 tablet 2  . midodrine (PROAMATINE) 10 MG tablet Take 1 tablet (10 mg total) by mouth 3 (three) times daily with meals. 90 tablet 3  . montelukast (SINGULAIR) 10 MG tablet Take 10 mg by mouth at bedtime.   1  . potassium chloride SA (K-DUR,KLOR-CON) 20 MEQ tablet Take 3 tablets (60 mEq total) by mouth 2 (two) times daily. 180 tablet 11  . simvastatin (ZOCOR) 10 MG tablet Take 10 mg by mouth every evening.     Marland Kitchen spironolactone (ALDACTONE) 25 MG tablet Take 0.5 tablets (12.5 mg total) by mouth daily. 45 tablet 3  . torsemide (DEMADEX) 20 MG tablet Take 3 tablets (60 mg total) by mouth 2 (two) times daily. 180 tablet 3  . zolpidem (AMBIEN) 10 MG tablet Take 10 mg by mouth at bedtime as needed. For sleep    . ARIPiprazole (ABILIFY) 2 MG tablet Take 2 mg by mouth every morning.     . clindamycin (CLEOCIN) 300 MG capsule Take 600 mg by mouth as needed (1 hour prior to dental work).    . febuxostat (ULORIC) 40 MG tablet Take 40 mg by mouth at bedtime.     . metroNIDAZOLE (FLAGYL) 500 MG tablet TK 1 T PO TID FOR 10 DAYS  0   Current Facility-Administered Medications  Medication Dose Route Frequency Provider Last Rate Last Dose  . triamcinolone acetonide (KENALOG-40) injection 20 mg  20 mg Other Once Landis Martins, DPM        Vitals:   03/30/18 1344  BP: 118/78    Pulse: 74  SpO2: 100%  Weight: 86.9 kg (191 lb 9.6 oz)   Orthostatics: Sitting: 134/86 Standing: 106/78  Wt Readings from Last 3 Encounters:  03/30/18 86.9 kg (191 lb 9.6 oz)  03/25/18 87.3 kg (192 lb 6.4 oz)  03/17/18 88.3 kg (194 lb 9.6 oz)    PHYSICAL EXAM: General: Obese. No resp difficulty. HEENT: Normal Neck: Supple. JVP flat. Carotids 2+ bilat; no bruits.  No thyromegaly or nodule noted. Cor: PMI nondisplaced. RRR, No M/G/R noted Lungs: CTAB, normal effort. Abdomen: Soft, non-tender, non-distended, no HSM. No bruits or masses. +BS  Extremities: No cyanosis, clubbing, or rash. R and LLE no edema. BLE unna boots Neuro: Alert & orientedx3, cranial nerves grossly intact. moves all 4 extremities w/o difficulty. Affect pleasant  ReDS Clip: 27%  ASSESSMENT & PLAN:  1) Chronic systolic HF: NICM s/p Medtronic CRT-D, ?Viral myocarditis. Cardiomyopathy dates from 35. EF 45-50% (09/2013), EF up to 55-60% on 7/18 echo.  PYP 09/02/17 negative for TTR (Grade 0-1, H/CCL 1.2). SPEP with no M-spike.  - NYHA III. Volume status dry on exam. ReDS Clip 27%. Down 17 lbs since August.  - Hold torsemide x 2 days. Decrease torsemide to 60 mg daily. She can take an extra 60 mg PRN.  - DC coreg with ongoing orthostatisis - Continue spiro 12.5 mg daily.  - Continue unna boots through [facility] - Continue HF paramedicine.  - Repeat echo at follow up.  2) Obesity - Body mass index is 37.42 kg/m.  - Encouraged weight loss.  3) HTN - Stable.  4) Orthostatic Hypotension.  - Neurology following. Gabapentin decreased. She is also on diamox for increased ICP - Continue midodrine to 10 mg TID. DC coreg 5) Chronic Venous stasis - Continue compression wraps. No change. 6) Falls  - Followed by Dr [name]. Lots of testing scheduled for February/March 7) CKD 3 - Creatinine baseline 1.4-1.6 - BMET today  Check BMET DC coreg with ongoing orthostasis Decrease torsemide to 60 mg daily Follow up 3 months  with echo with Dr Haroldine Laws  Georgiana Shore NP-C  1:47 PM   Greater than 50% of the 25 minute visit was spent in counseling/coordination of care regarding disease state education, salt/fluid restriction, sliding scale diuretics, and medication compliance.

## 2018-03-30 ENCOUNTER — Other Ambulatory Visit (HOSPITAL_COMMUNITY): Payer: Self-pay

## 2018-03-30 ENCOUNTER — Other Ambulatory Visit: Payer: Self-pay

## 2018-03-30 ENCOUNTER — Ambulatory Visit (HOSPITAL_COMMUNITY)
Admission: RE | Admit: 2018-03-30 | Discharge: 2018-03-30 | Disposition: A | Discharge status: Home / Self Care | Payer: Medicare HMO | Source: Ambulatory Visit | Attending: Internal Medicine | Admitting: Internal Medicine

## 2018-03-30 VITALS — BP 118/78 | HR 74 | Wt 191.6 lb

## 2018-03-30 DIAGNOSIS — F419 Anxiety disorder, unspecified: Secondary | ICD-10-CM | POA: Diagnosis not present

## 2018-03-30 DIAGNOSIS — Z79890 Hormone replacement therapy (postmenopausal): Secondary | ICD-10-CM | POA: Insufficient documentation

## 2018-03-30 DIAGNOSIS — Z79899 Other long term (current) drug therapy: Secondary | ICD-10-CM | POA: Diagnosis not present

## 2018-03-30 DIAGNOSIS — K219 Gastro-esophageal reflux disease without esophagitis: Secondary | ICD-10-CM | POA: Insufficient documentation

## 2018-03-30 DIAGNOSIS — N183 Chronic kidney disease, stage 3 unspecified: Secondary | ICD-10-CM

## 2018-03-30 DIAGNOSIS — E785 Hyperlipidemia, unspecified: Secondary | ICD-10-CM | POA: Diagnosis not present

## 2018-03-30 DIAGNOSIS — E559 Vitamin D deficiency, unspecified: Secondary | ICD-10-CM | POA: Diagnosis not present

## 2018-03-30 DIAGNOSIS — E1142 Type 2 diabetes mellitus with diabetic polyneuropathy: Secondary | ICD-10-CM | POA: Diagnosis not present

## 2018-03-30 DIAGNOSIS — J45909 Unspecified asthma, uncomplicated: Secondary | ICD-10-CM | POA: Insufficient documentation

## 2018-03-30 DIAGNOSIS — E039 Hypothyroidism, unspecified: Secondary | ICD-10-CM | POA: Diagnosis not present

## 2018-03-30 DIAGNOSIS — Z6837 Body mass index (BMI) 37.0-37.9, adult: Secondary | ICD-10-CM | POA: Diagnosis not present

## 2018-03-30 DIAGNOSIS — Z923 Personal history of irradiation: Secondary | ICD-10-CM | POA: Insufficient documentation

## 2018-03-30 DIAGNOSIS — Z7951 Long term (current) use of inhaled steroids: Secondary | ICD-10-CM | POA: Diagnosis not present

## 2018-03-30 DIAGNOSIS — I428 Other cardiomyopathies: Secondary | ICD-10-CM | POA: Diagnosis not present

## 2018-03-30 DIAGNOSIS — I951 Orthostatic hypotension: Secondary | ICD-10-CM | POA: Diagnosis not present

## 2018-03-30 DIAGNOSIS — R296 Repeated falls: Secondary | ICD-10-CM | POA: Diagnosis not present

## 2018-03-30 DIAGNOSIS — I5032 Chronic diastolic (congestive) heart failure: Secondary | ICD-10-CM | POA: Diagnosis not present

## 2018-03-30 DIAGNOSIS — I878 Other specified disorders of veins: Secondary | ICD-10-CM | POA: Diagnosis not present

## 2018-03-30 DIAGNOSIS — I429 Cardiomyopathy, unspecified: Secondary | ICD-10-CM | POA: Diagnosis not present

## 2018-03-30 DIAGNOSIS — F329 Major depressive disorder, single episode, unspecified: Secondary | ICD-10-CM | POA: Diagnosis not present

## 2018-03-30 DIAGNOSIS — Z9221 Personal history of antineoplastic chemotherapy: Secondary | ICD-10-CM | POA: Diagnosis not present

## 2018-03-30 DIAGNOSIS — E669 Obesity, unspecified: Secondary | ICD-10-CM | POA: Diagnosis not present

## 2018-03-30 DIAGNOSIS — M1990 Unspecified osteoarthritis, unspecified site: Secondary | ICD-10-CM | POA: Diagnosis not present

## 2018-03-30 DIAGNOSIS — Z9981 Dependence on supplemental oxygen: Secondary | ICD-10-CM | POA: Insufficient documentation

## 2018-03-30 DIAGNOSIS — Z95810 Presence of automatic (implantable) cardiac defibrillator: Secondary | ICD-10-CM | POA: Diagnosis not present

## 2018-03-30 DIAGNOSIS — I130 Hypertensive heart and chronic kidney disease with heart failure and stage 1 through stage 4 chronic kidney disease, or unspecified chronic kidney disease: Secondary | ICD-10-CM | POA: Insufficient documentation

## 2018-03-30 DIAGNOSIS — Z853 Personal history of malignant neoplasm of breast: Secondary | ICD-10-CM | POA: Insufficient documentation

## 2018-03-30 DIAGNOSIS — I5022 Chronic systolic (congestive) heart failure: Secondary | ICD-10-CM

## 2018-03-30 DIAGNOSIS — Z87730 Personal history of (corrected) cleft lip and palate: Secondary | ICD-10-CM | POA: Insufficient documentation

## 2018-03-30 DIAGNOSIS — E1122 Type 2 diabetes mellitus with diabetic chronic kidney disease: Secondary | ICD-10-CM | POA: Insufficient documentation

## 2018-03-30 DIAGNOSIS — I110 Hypertensive heart disease with heart failure: Secondary | ICD-10-CM | POA: Diagnosis not present

## 2018-03-30 DIAGNOSIS — D649 Anemia, unspecified: Secondary | ICD-10-CM | POA: Diagnosis not present

## 2018-03-30 LAB — BASIC METABOLIC PANEL
Anion gap: 7 (ref 5–15)
BUN: 26 mg/dL — ABNORMAL HIGH (ref 8–23)
CO2: 29 mmol/L (ref 22–32)
Calcium: 9 mg/dL (ref 8.9–10.3)
Chloride: 103 mmol/L (ref 98–111)
Creatinine, Ser: 1.63 mg/dL — ABNORMAL HIGH (ref 0.44–1.00)
GFR calc Af Amer: 37 mL/min — ABNORMAL LOW (ref >60)
GFR calc non Af Amer: 32 mL/min — ABNORMAL LOW (ref >60)
Glucose, Bld: 79 mg/dL (ref 70–99)
Potassium: 3.9 mmol/L (ref 3.5–5.1)
Sodium: 139 mmol/L (ref 135–145)

## 2018-03-30 MED ORDER — TORSEMIDE 20 MG PO TABS: 60.0000 mg | ORAL_TABLET | Freq: Every day | ORAL | 3 refills | Status: DC

## 2018-03-30 NOTE — Patient Instructions (Signed)
STOP Coreg HOLD Torsemide for 2 days, then DECREASE to 60 mg (3 tabs) daily  Labs today We will only contact you if something comes back abnormal or we need to make some changes. Otherwise no news is good news!   Your physician recommends that you schedule a follow-up appointment in: 3 months with Dr Haroldine Laws and echo  Your physician has requested that you have an echocardiogram. Echocardiography is a painless test that uses sound waves to create images of your heart. It provides your doctor with information about the size and shape of your heart and how well your heart's chambers and valves are working. This procedure takes approximately one hour. There are no restrictions for this procedure.  Do the following things EVERYDAY: 1) Weigh yourself in the morning before breakfast. Write it down and keep it in a log. 2) Take your medicines as prescribed 3) Eat low salt foods-Limit salt (sodium) to 2000 mg per day.  4) Stay as active as you can everyday 5) Limit all fluids for the day to less than 2 liters

## 2018-03-30 NOTE — Progress Notes (Signed)
Paramedicine Encounter    Patient ID: Rhonda [name], female    DOB: 1950-06-24, 67 y.o.   MRN: [id number]    Patient Care Team: Maurice Small, MD as PCP - General (Family Medicine)  Patient Active Problem List   Diagnosis Date Noted  . Orthostatic hypotension 07/28/2017  . SVD (spontaneous vaginal delivery)   . Peripheral neuropathy   . On home oxygen therapy   . Migraines   . Left bundle branch block   . Hypothyroidism   . Hypertension   . Hyperlipidemia   . Heart murmur   . GERD (gastroesophageal reflux disease)   . Exertional shortness of breath   . Depression   . Complication of anesthesia   . Back pain   . Arthritis   . Anxiety   . Anemia   . HTN (hypertension) 09/14/2013  . Chronic respiratory failure ([place]) 09/14/2013  . 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 Morbid obesity ([place]) 11/01/2012  . Chronic systolic heart failure ([place]) 10/27/2012  . S/P right TKA 05/10/2012  . Endometrial polyp 01/20/2012  . Breast cancer, stage 1 ([place]) 03/26/2011  . Unspecified vitamin D deficiency 03/26/2011    Current Outpatient Medications:  .  acetaZOLAMIDE (DIAMOX) 125 MG tablet, Take 1 tablet (125 mg total) by mouth daily., Disp: 30 tablet, Rfl: 2 .  ALPRAZolam (XANAX) 0.25 MG tablet, , Disp: , Rfl: 0 .  ARIPiprazole (ABILIFY) 2 MG tablet, Take 2 mg by mouth every morning. , Disp: , Rfl:  .  BISACODYL 5 MG EC tablet, TK UTD, Disp: , Rfl: 0 .  buPROPion (WELLBUTRIN XL) 150 MG 24 hr tablet, Take 450 mg by mouth every morning. , Disp: , Rfl:  .  carvedilol (COREG) 3.125 MG tablet, TAKE 1 TABLET(3.125 MG) BY MOUTH TWICE DAILY WITH A MEAL, Disp: 180 tablet, Rfl: 3 .  citalopram (CELEXA) 10 MG tablet, Take 10 mg by mouth daily. , Disp: , Rfl:  .  clindamycin (CLEOCIN) 300 MG capsule, Take 600 mg by mouth as needed (1 hour prior to dental work)., Disp: , Rfl:  .  clonazePAM (KLONOPIN) 0.5 MG tablet, Take 1 mg by mouth daily. , Disp: , Rfl:  .   febuxostat (ULORIC) 40 MG tablet, Take 40 mg by mouth at bedtime. , Disp: , Rfl:  .  fexofenadine (ALLEGRA) 180 MG tablet, Take 180 mg by mouth 2 (two) times daily., Disp: , Rfl:  .  fluticasone (FLONASE) 50 MCG/ACT nasal spray, INSTILL 2 SPRAYS INTO EACH NOSTRIL ONCE DAILY, Disp: , Rfl: 7 .  gabapentin (NEURONTIN) 300 MG capsule, Take 300 mg by mouth 3 (three) times daily. , Disp: , Rfl:  .  gabapentin (NEURONTIN) 800 MG tablet, Take 800 mg by mouth as needed., Disp: , Rfl:  .  ipratropium (ATROVENT) 0.03 % nasal spray, INSTILL 2 SPRAYS INTO EACH NOSTRIL TWICE A DAY, Disp: , Rfl: 10 .  levothyroxine (SYNTHROID, LEVOTHROID) 100 MCG tablet, Take 1 tablet (100 mcg total) by mouth daily., Disp: 45 tablet, Rfl: 3 .  metolazone (ZAROXOLYN) 2.5 MG tablet, take 1 tablet by mouth if needed for FLUID OR EDEMA, Disp: 30 tablet, Rfl: 2 .  metroNIDAZOLE (FLAGYL) 500 MG tablet, TK 1 T PO TID FOR 10 DAYS, Disp: , Rfl: 0 .  midodrine (PROAMATINE) 10 MG tablet, Take 1 tablet (10 mg total) by mouth 3 (three) times daily with meals., Disp: 90 tablet, Rfl: 3 .  montelukast (SINGULAIR) 10 MG tablet, Take 10 mg by mouth  at bedtime. , Disp: , Rfl: 1 .  potassium chloride SA (K-DUR,KLOR-CON) 20 MEQ tablet, Take 3 tablets (60 mEq total) by mouth 2 (two) times daily., Disp: 180 tablet, Rfl: 11 .  simvastatin (ZOCOR) 10 MG tablet, Take 10 mg by mouth every evening. , Disp: , Rfl:  .  spironolactone (ALDACTONE) 25 MG tablet, Take 0.5 tablets (12.5 mg total) by mouth daily., Disp: 45 tablet, Rfl: 3 .  torsemide (DEMADEX) 20 MG tablet, Take 3 tablets (60 mg total) by mouth 2 (two) times daily., Disp: 180 tablet, Rfl: 3 .  zolpidem (AMBIEN) 10 MG tablet, Take 10 mg by mouth at bedtime as needed. For sleep, Disp: , Rfl:   Current Facility-Administered Medications:  .  triamcinolone acetonide (KENALOG-40) injection 20 mg, 20 mg, Other, Once, Landis Martins, DPM Allergies  Allergen Reactions  . Ceftin Anaphylaxis    Face and  throat swell   . Shellfish Allergy Other (See Comments)    Gout exacerbation  . Allopurinol Nausea Only and Other (See Comments)    weakness  . Ativan [Lorazepam] Itching  . Lorazepam Itching  . Sulfa Antibiotics Itching  . Ultram [Tramadol Hcl] Itching     Social History   Socioeconomic History  . Marital status: Married    Spouse name: Not on file  . Number of children: 2  . Years of education: masters  . Highest education level: Not on file  Occupational History  . Not on file  Social Needs  . Financial resource strain: Not on file  . Food insecurity:    Worry: Not on file    Inability: Not on file  . Transportation needs:    Medical: Not on file    Non-medical: Not on file  Tobacco Use  . Smoking status: Former Smoker    Packs/day: 0.10    Years: 26.00    Pack years: 2.60    Types: Cigarettes    Last attempt to quit: 05/06/2007    Years since quitting: 10.9  . Smokeless tobacco: Never Used  Substance and Sexual Activity  . Alcohol use: No  . Drug use: No  . Sexual activity: Yes  Lifestyle  . Physical activity:    Days per week: Not on file    Minutes per session: Not on file  . Stress: Not on file  Relationships  . Social connections:    Talks on phone: Not on file    Gets together: Not on file    Attends religious service: Not on file    Active member of club or organization: Not on file    Attends meetings of clubs or organizations: Not on file    Relationship status: Not on file  . Intimate partner violence:    Fear of current or ex partner: Not on file    Emotionally abused: Not on file    Physically abused: Not on file    Forced sexual activity: Not on file  Other Topics Concern  . Not on file  Social History Narrative   Tobacco Use Cigarettes: Former Smoker, Quit in 2008   No Alcohol   No recreational drug use   Diet: Regular/Low Carb   Exercise: None   Occupation: disabled   Education: Research officer, political party, masters   Children: 2   Firearms:  No   Therapist, art Use: Always   Former Metallurgist.        Physical Exam      Future Appointments  Date Time Provider [place]  04/18/2018  4:10 PM [facility] DEVICE REMOTES [facility]  06/09/2018 10:30 AM Philemon Kingdom, MD [facility] None  06/15/2018  1:45 PM Marzetta Board, DPM [facility]  07/27/2018  8:50 AM [name], Arvin Collard K, DO [facility] None     BP 118/78 (BP Location: Right Arm, Patient Position: Sitting)   Pulse 74   Resp 16   Wt 191 lb 9.6 oz (86.9 kg)   BMI 37.42 kg/m  Vitals taken during clinic visit  This is visit is a clinic visit with Caryl Pina.  Pt c/o of left side abdominal pain.  Pt weight is significantly down since her last hospital stay.  Pt had a couple of weeks ago, which could be the reason for the weight loss.  Pt reports trying to eat better; she's eating can foods sometimes; she mostly have smoothies.  Pt still have dizziness when she stands up; no improvement since she started the .  Orthostatics done during this visit 106/78 sitting 134/86. Clip also done today's visit.     Pt's husband gave pt's hx from Dr. Lottie Rater to Caryl Pina: Gabapentin decreased to 300 mg (evening only).  Possible stage 3 kidney failure.   Vitamins should be taken at night.  Levothyroxine should be taken on empty stomach in the am.  Memory functions hasn't improved since the lumber puncture.   [facility] to [facility] her legs.  During this visit, pt's husband stated that pt should still be taking abilify. Gabapentin decreased to 300 mg in the evening.  Med changes during this visit: Discontinue coreg Decrease to 60mg  once daily (*advised to not take any today and tomorrow;resume after two days)  Shany Marinez, EMT Paramedic [phone] 03/30/2018    ACTION: Home visit completed

## 2018-03-31 DIAGNOSIS — Z124 Encounter for screening for malignant neoplasm of cervix: Secondary | ICD-10-CM | POA: Diagnosis not present

## 2018-03-31 DIAGNOSIS — Z6837 Body mass index (BMI) 37.0-37.9, adult: Secondary | ICD-10-CM | POA: Diagnosis not present

## 2018-04-01 ENCOUNTER — Other Ambulatory Visit (HOSPITAL_COMMUNITY): Payer: Self-pay

## 2018-04-01 NOTE — Progress Notes (Signed)
Paramedicine Encounter    Patient ID: Rhonda [name], female    DOB: 1951-03-30, 67 y.o.   MRN: [id number]    Patient Care Team: Maurice Small, MD as PCP - General (Family Medicine)  Patient Active Problem List   Diagnosis Date Noted  . Orthostatic hypotension 07/28/2017  . SVD (spontaneous vaginal delivery)   . Peripheral neuropathy   . On home oxygen therapy   . Migraines   . Left bundle branch block   . Hypothyroidism   . Hypertension   . Hyperlipidemia   . Heart murmur   . GERD (gastroesophageal reflux disease)   . Exertional shortness of breath   . Depression   . Complication of anesthesia   . Back pain   . Arthritis   . Anxiety   . Anemia   . HTN (hypertension) 09/14/2013  . Chronic respiratory failure ([place]) 09/14/2013  . 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 Morbid obesity ([place]) 11/01/2012  . Chronic systolic heart failure ([place]) 10/27/2012  . S/P right TKA 05/10/2012  . Endometrial polyp 01/20/2012  . Breast cancer, stage 1 ([place]) 03/26/2011  . Unspecified vitamin D deficiency 03/26/2011    Current Outpatient Medications:  .  acetaZOLAMIDE (DIAMOX) 125 MG tablet, Take 1 tablet (125 mg total) by mouth daily., Disp: 30 tablet, Rfl: 2 .  ALPRAZolam (XANAX) 0.25 MG tablet, , Disp: , Rfl: 0 .  ARIPiprazole (ABILIFY) 2 MG tablet, Take 2 mg by mouth every morning. , Disp: , Rfl:  .  BISACODYL 5 MG EC tablet, TK UTD, Disp: , Rfl: 0 .  buPROPion (WELLBUTRIN XL) 150 MG 24 hr tablet, Take 450 mg by mouth every morning. , Disp: , Rfl:  .  citalopram (CELEXA) 10 MG tablet, Take 10 mg by mouth daily. , Disp: , Rfl:  .  clindamycin (CLEOCIN) 300 MG capsule, Take 600 mg by mouth as needed (1 hour prior to dental work)., Disp: , Rfl:  .  clonazePAM (KLONOPIN) 0.5 MG tablet, Take 1 mg by mouth daily. , Disp: , Rfl:  .  febuxostat (ULORIC) 40 MG tablet, Take 40 mg by mouth at bedtime. , Disp: , Rfl:  .  fexofenadine (ALLEGRA) 180 MG  tablet, Take 180 mg by mouth 2 (two) times daily., Disp: , Rfl:  .  fluticasone (FLONASE) 50 MCG/ACT nasal spray, INSTILL 2 SPRAYS INTO EACH NOSTRIL ONCE DAILY, Disp: , Rfl: 7 .  gabapentin (NEURONTIN) 300 MG capsule, Take 300 mg by mouth 3 (three) times daily. , Disp: , Rfl:  .  gabapentin (NEURONTIN) 800 MG tablet, Take 800 mg by mouth as needed., Disp: , Rfl:  .  ipratropium (ATROVENT) 0.03 % nasal spray, INSTILL 2 SPRAYS INTO EACH NOSTRIL TWICE A DAY, Disp: , Rfl: 10 .  levothyroxine (SYNTHROID, LEVOTHROID) 100 MCG tablet, Take 1 tablet (100 mcg total) by mouth daily., Disp: 45 tablet, Rfl: 3 .  metolazone (ZAROXOLYN) 2.5 MG tablet, take 1 tablet by mouth if needed for FLUID OR EDEMA, Disp: 30 tablet, Rfl: 2 .  metroNIDAZOLE (FLAGYL) 500 MG tablet, TK 1 T PO TID FOR 10 DAYS, Disp: , Rfl: 0 .  midodrine (PROAMATINE) 10 MG tablet, Take 1 tablet (10 mg total) by mouth 3 (three) times daily with meals., Disp: 90 tablet, Rfl: 3 .  montelukast (SINGULAIR) 10 MG tablet, Take 10 mg by mouth at bedtime. , Disp: , Rfl: 1 .  potassium chloride SA (K-DUR,KLOR-CON) 20 MEQ tablet, Take 3 tablets (60 mEq total) by  mouth 2 (two) times daily., Disp: 180 tablet, Rfl: 11 .  simvastatin (ZOCOR) 10 MG tablet, Take 10 mg by mouth every evening. , Disp: , Rfl:  .  spironolactone (ALDACTONE) 25 MG tablet, Take 0.5 tablets (12.5 mg total) by mouth daily., Disp: 45 tablet, Rfl: 3 .  [START ON 04/02/2018] torsemide (DEMADEX) 20 MG tablet, Take 3 tablets (60 mg total) by mouth daily., Disp: 180 tablet, Rfl: 3 .  zolpidem (AMBIEN) 10 MG tablet, Take 10 mg by mouth at bedtime as needed. For sleep, Disp: , Rfl:   Current Facility-Administered Medications:  .  triamcinolone acetonide (KENALOG-40) injection 20 mg, 20 mg, Other, Once, Landis Martins, DPM Allergies  Allergen Reactions  . Ceftin Anaphylaxis    Face and throat swell   . Shellfish Allergy Other (See Comments)    Gout exacerbation  . Allopurinol Nausea Only  and Other (See Comments)    weakness  . Ativan [Lorazepam] Itching  . Lorazepam Itching  . Sulfa Antibiotics Itching  . Ultram [Tramadol Hcl] Itching     Social History   Socioeconomic History  . Marital status: Married    Spouse name: Not on file  . Number of children: 2  . Years of education: masters  . Highest education level: Not on file  Occupational History  . Not on file  Social Needs  . Financial resource strain: Not on file  . Food insecurity:    Worry: Not on file    Inability: Not on file  . Transportation needs:    Medical: Not on file    Non-medical: Not on file  Tobacco Use  . Smoking status: Former Smoker    Packs/day: 0.10    Years: 26.00    Pack years: 2.60    Types: Cigarettes    Last attempt to quit: 05/06/2007    Years since quitting: 10.9  . Smokeless tobacco: Never Used  Substance and Sexual Activity  . Alcohol use: No  . Drug use: No  . Sexual activity: Yes  Lifestyle  . Physical activity:    Days per week: Not on file    Minutes per session: Not on file  . Stress: Not on file  Relationships  . Social connections:    Talks on phone: Not on file    Gets together: Not on file    Attends religious service: Not on file    Active member of club or organization: Not on file    Attends meetings of clubs or organizations: Not on file    Relationship status: Not on file  . Intimate partner violence:    Fear of current or ex partner: Not on file    Emotionally abused: Not on file    Physically abused: Not on file    Forced sexual activity: Not on file  Other Topics Concern  . Not on file  Social History Narrative   Tobacco Use Cigarettes: Former Smoker, Quit in 2008   No Alcohol   No recreational drug use   Diet: Regular/Low Carb   Exercise: None   Occupation: disabled   Education: Research officer, political party, masters   Children: 2   Firearms: No   Therapist, art Use: Always   Former Metallurgist.        Physical Exam  Constitutional: She appears  well-developed.  Eyes: Pupils are equal, round, and reactive to light.  Neck: Normal range of motion. Neck supple.  Cardiovascular: Normal rate and regular rhythm.  Pulmonary/Chest: Effort normal and breath  sounds normal. No respiratory distress.  Abdominal: Soft. She exhibits no distension.  Musculoskeletal: Normal range of motion. She exhibits edema.  Neurological: She is alert.  Skin: Skin is warm and dry.        Future Appointments  Date Time Provider [place]  04/18/2018  4:10 PM [facility] DEVICE REMOTES [facility]  06/09/2018 10:30 AM Philemon Kingdom, MD [facility] None  06/15/2018  1:45 PM Marzetta Board, DPM [facility]  06/30/2018 10:00 AM [facility]  06/30/2018 11:00 AM Bensimhon, Shaune Pascal, MD [facility] None  07/27/2018  8:50 AM Narda Amber K, DO [facility] None     There were no vitals taken for this visit.  Weight yesterday-195lbs Last visit weight-194lbs  ATF pt ambulating around the house without shortness of breath. Patient stated she still has a lack of appetite but states she is eating some. Patient's vitals obtained and are as noted. Patient's medications reviewed and pill box filled and confirmed. Patient has some edema present with compression hose on. Patient has not been compliant with her fluid pill. Patient has also not been weighing consistently. Patient reminded to do same and to be taking medications as prescribed. Patient denied any difficulty breathing or sleeping. Visit scheduled for one week out.    Medication ordered: uloric abilify pharmacist stated that pt doesn't have a prescription Alprazolam pharmacist resent request to pt's physician   [place], EMT Paramedic [phone] 04/01/2018    ACTION: Home visit completed

## 2018-04-03 ENCOUNTER — Other Ambulatory Visit: Payer: Self-pay | Admitting: Neurology

## 2018-04-04 DIAGNOSIS — M1990 Unspecified osteoarthritis, unspecified site: Secondary | ICD-10-CM | POA: Diagnosis not present

## 2018-04-04 DIAGNOSIS — I951 Orthostatic hypotension: Secondary | ICD-10-CM | POA: Diagnosis not present

## 2018-04-04 DIAGNOSIS — I5022 Chronic systolic (congestive) heart failure: Secondary | ICD-10-CM | POA: Diagnosis not present

## 2018-04-04 DIAGNOSIS — I110 Hypertensive heart disease with heart failure: Secondary | ICD-10-CM | POA: Diagnosis not present

## 2018-04-04 DIAGNOSIS — I429 Cardiomyopathy, unspecified: Secondary | ICD-10-CM | POA: Diagnosis not present

## 2018-04-04 DIAGNOSIS — R296 Repeated falls: Secondary | ICD-10-CM | POA: Diagnosis not present

## 2018-04-04 DIAGNOSIS — I878 Other specified disorders of veins: Secondary | ICD-10-CM | POA: Diagnosis not present

## 2018-04-04 DIAGNOSIS — E1142 Type 2 diabetes mellitus with diabetic polyneuropathy: Secondary | ICD-10-CM | POA: Diagnosis not present

## 2018-04-04 DIAGNOSIS — D649 Anemia, unspecified: Secondary | ICD-10-CM | POA: Diagnosis not present

## 2018-04-07 ENCOUNTER — Telehealth (HOSPITAL_COMMUNITY): Payer: Self-pay

## 2018-04-07 ENCOUNTER — Other Ambulatory Visit (HOSPITAL_COMMUNITY): Payer: Self-pay

## 2018-04-07 DIAGNOSIS — I110 Hypertensive heart disease with heart failure: Secondary | ICD-10-CM | POA: Diagnosis not present

## 2018-04-07 DIAGNOSIS — I878 Other specified disorders of veins: Secondary | ICD-10-CM | POA: Diagnosis not present

## 2018-04-07 DIAGNOSIS — R296 Repeated falls: Secondary | ICD-10-CM | POA: Diagnosis not present

## 2018-04-07 DIAGNOSIS — I951 Orthostatic hypotension: Secondary | ICD-10-CM | POA: Diagnosis not present

## 2018-04-07 DIAGNOSIS — M1990 Unspecified osteoarthritis, unspecified site: Secondary | ICD-10-CM | POA: Diagnosis not present

## 2018-04-07 DIAGNOSIS — I429 Cardiomyopathy, unspecified: Secondary | ICD-10-CM | POA: Diagnosis not present

## 2018-04-07 DIAGNOSIS — D649 Anemia, unspecified: Secondary | ICD-10-CM | POA: Diagnosis not present

## 2018-04-07 DIAGNOSIS — I5022 Chronic systolic (congestive) heart failure: Secondary | ICD-10-CM | POA: Diagnosis not present

## 2018-04-07 DIAGNOSIS — E1142 Type 2 diabetes mellitus with diabetic polyneuropathy: Secondary | ICD-10-CM | POA: Diagnosis not present

## 2018-04-07 NOTE — Progress Notes (Signed)
Paramedicine Encounter    Patient ID: Rhonda [name], female    DOB: 1950/08/16, 67 y.o.   MRN: [id number]    Patient Care Team: Maurice Small, MD as PCP - General (Family Medicine)  Patient Active Problem List   Diagnosis Date Noted  . Orthostatic hypotension 07/28/2017  . SVD (spontaneous vaginal delivery)   . Peripheral neuropathy   . On home oxygen therapy   . Migraines   . Left bundle branch block   . Hypothyroidism   . Hypertension   . Hyperlipidemia   . Heart murmur   . GERD (gastroesophageal reflux disease)   . Exertional shortness of breath   . Depression   . Complication of anesthesia   . Back pain   . Arthritis   . Anxiety   . Anemia   . HTN (hypertension) 09/14/2013  . Chronic respiratory failure ([place]) 09/14/2013  . 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 Morbid obesity ([place]) 11/01/2012  . Chronic systolic heart failure ([place]) 10/27/2012  . S/P right TKA 05/10/2012  . Endometrial polyp 01/20/2012  . Breast cancer, stage 1 ([place]) 03/26/2011  . Unspecified vitamin D deficiency 03/26/2011    Current Outpatient Medications:  .  acetaZOLAMIDE (DIAMOX) 125 MG tablet, TAKE 1 TABLET(125 MG) BY MOUTH DAILY, Disp: 30 tablet, Rfl: 5 .  ALPRAZolam (XANAX) 0.25 MG tablet, , Disp: , Rfl: 0 .  ARIPiprazole (ABILIFY) 2 MG tablet, Take 2 mg by mouth every morning. , Disp: , Rfl:  .  BISACODYL 5 MG EC tablet, TK UTD, Disp: , Rfl: 0 .  buPROPion (WELLBUTRIN XL) 150 MG 24 hr tablet, Take 450 mg by mouth every morning. , Disp: , Rfl:  .  citalopram (CELEXA) 10 MG tablet, Take 10 mg by mouth daily. , Disp: , Rfl:  .  clindamycin (CLEOCIN) 300 MG capsule, Take 600 mg by mouth as needed (1 hour prior to dental work)., Disp: , Rfl:  .  clonazePAM (KLONOPIN) 0.5 MG tablet, Take 1 mg by mouth daily. , Disp: , Rfl:  .  febuxostat (ULORIC) 40 MG tablet, Take 40 mg by mouth at bedtime. , Disp: , Rfl:  .  fexofenadine (ALLEGRA) 180 MG tablet, Take  180 mg by mouth 2 (two) times daily., Disp: , Rfl:  .  fluticasone (FLONASE) 50 MCG/ACT nasal spray, INSTILL 2 SPRAYS INTO EACH NOSTRIL ONCE DAILY, Disp: , Rfl: 7 .  gabapentin (NEURONTIN) 300 MG capsule, Take 300 mg by mouth 3 (three) times daily. , Disp: , Rfl:  .  gabapentin (NEURONTIN) 800 MG tablet, Take 800 mg by mouth as needed., Disp: , Rfl:  .  ipratropium (ATROVENT) 0.03 % nasal spray, INSTILL 2 SPRAYS INTO EACH NOSTRIL TWICE A DAY, Disp: , Rfl: 10 .  levothyroxine (SYNTHROID, LEVOTHROID) 100 MCG tablet, Take 1 tablet (100 mcg total) by mouth daily., Disp: 45 tablet, Rfl: 3 .  metolazone (ZAROXOLYN) 2.5 MG tablet, take 1 tablet by mouth if needed for FLUID OR EDEMA, Disp: 30 tablet, Rfl: 2 .  metroNIDAZOLE (FLAGYL) 500 MG tablet, TK 1 T PO TID FOR 10 DAYS, Disp: , Rfl: 0 .  midodrine (PROAMATINE) 10 MG tablet, Take 1 tablet (10 mg total) by mouth 3 (three) times daily with meals., Disp: 90 tablet, Rfl: 3 .  montelukast (SINGULAIR) 10 MG tablet, Take 10 mg by mouth at bedtime. , Disp: , Rfl: 1 .  potassium chloride SA (K-DUR,KLOR-CON) 20 MEQ tablet, Take 3 tablets (60 mEq total) by mouth 2 (  two) times daily., Disp: 180 tablet, Rfl: 11 .  simvastatin (ZOCOR) 10 MG tablet, Take 10 mg by mouth every evening. , Disp: , Rfl:  .  spironolactone (ALDACTONE) 25 MG tablet, Take 0.5 tablets (12.5 mg total) by mouth daily., Disp: 45 tablet, Rfl: 3 .  torsemide (DEMADEX) 20 MG tablet, Take 3 tablets (60 mg total) by mouth daily., Disp: 180 tablet, Rfl: 3 .  zolpidem (AMBIEN) 10 MG tablet, Take 10 mg by mouth at bedtime as needed. For sleep, Disp: , Rfl:   Current Facility-Administered Medications:  .  triamcinolone acetonide (KENALOG-40) injection 20 mg, 20 mg, Other, Once, Landis Martins, DPM Allergies  Allergen Reactions  . Ceftin Anaphylaxis    Face and throat swell   . Shellfish Allergy Other (See Comments)    Gout exacerbation  . Allopurinol Nausea Only and Other (See Comments)     weakness  . Ativan [Lorazepam] Itching  . Lorazepam Itching  . Sulfa Antibiotics Itching  . Ultram [Tramadol Hcl] Itching     Social History   Socioeconomic History  . Marital status: Married    Spouse name: Not on file  . Number of children: 2  . Years of education: masters  . Highest education level: Not on file  Occupational History  . Not on file  Social Needs  . Financial resource strain: Not on file  . Food insecurity:    Worry: Not on file    Inability: Not on file  . Transportation needs:    Medical: Not on file    Non-medical: Not on file  Tobacco Use  . Smoking status: Former Smoker    Packs/day: 0.10    Years: 26.00    Pack years: 2.60    Types: Cigarettes    Last attempt to quit: 05/06/2007    Years since quitting: 10.9  . Smokeless tobacco: Never Used  Substance and Sexual Activity  . Alcohol use: No  . Drug use: No  . Sexual activity: Yes  Lifestyle  . Physical activity:    Days per week: Not on file    Minutes per session: Not on file  . Stress: Not on file  Relationships  . Social connections:    Talks on phone: Not on file    Gets together: Not on file    Attends religious service: Not on file    Active member of club or organization: Not on file    Attends meetings of clubs or organizations: Not on file    Relationship status: Not on file  . Intimate partner violence:    Fear of current or ex partner: Not on file    Emotionally abused: Not on file    Physically abused: Not on file    Forced sexual activity: Not on file  Other Topics Concern  . Not on file  Social History Narrative   Tobacco Use Cigarettes: Former Smoker, Quit in 2008   No Alcohol   No recreational drug use   Diet: Regular/Low Carb   Exercise: None   Occupation: disabled   Education: Research officer, political party, masters   Children: 2   Firearms: No   Therapist, art Use: Always   Former Metallurgist.        Physical Exam  Constitutional: She appears well-developed.  Eyes: Pupils  are equal, round, and reactive to light.  Neck: Normal range of motion. Neck supple.  Cardiovascular: Normal rate.  Pulmonary/Chest: Effort normal and breath sounds normal. No respiratory distress.  Abdominal: Soft.  She exhibits no distension.  Musculoskeletal: Normal range of motion.  Neurological: She is alert.  Skin: Skin is warm and dry. Capillary refill takes 2 to 3 seconds.  Psychiatric: She has a normal mood and affect.  "stressed"        Future Appointments  Date Time Provider [place]  04/18/2018  4:10 PM [facility] DEVICE REMOTES [facility]  06/09/2018 10:30 AM Philemon Kingdom, MD [facility] None  06/15/2018  1:45 PM Marzetta Board, DPM [facility]  06/30/2018 10:00 AM [facility]  06/30/2018 11:00 AM Bensimhon, Shaune Pascal, MD [facility] None  07/27/2018  8:50 AM [name], Arvin Collard K, DO [facility] None     Pulse 93   Resp 16   Wt 202 lb 9.6 oz (91.9 kg)   SpO2 98%   BMI 39.57 kg/m   Weight yesterday-200lb (tues) Last visit weight- 194lbs   ATF pt alert walking around her kitchen. Patient advised she was feeling okay physically but mentally just "stressed". Patient noted to have a 6 lb weight gain in the last 6 days. Patient denied shortness of breath. Patient stated she only uses one pillow to sleep. Patient stated when she walks long distances she does become short of breath. Patient stated she has had a decrease in appetite. Patient noted to have been taking all medications properly. Patient's vitals obtained. Assessment as noted. Lung sounds clear. Pharmacy called and medications confirmed and medications sent in for refill. Clinic called due to patient weight gain. Patient stated she has not taken metolazone recently. Message left with [facility] after multiple attempts.  Next visit scheduled for one week out.     **pt spoke with her pcp and she advised pt that she is to take xanax as needed, therefore she's  taking it as needed.  Medication ordered: Uloric  Acetazolamide [place] Mareesa Gathright, EMT Paramedic [phone] 04/07/2018    ACTION: Home visit completed Next visit planned for 1 week.

## 2018-04-07 NOTE — Telephone Encounter (Signed)
Please call back and ask her to take an extra 20 mg torsemide for 2 days.    Thanks  Betha Shadix NP-C  2:31 PM

## 2018-04-07 NOTE — Telephone Encounter (Signed)
Dee (paramedicine) called to state that pt has gained 6 lbs in last 6 days. She reports no SOB, vitals are normal. Pt has not had PT today yet and Dee suspects that pt may have some SOB. Please advise.

## 2018-04-07 NOTE — Telephone Encounter (Signed)
Pt called to confirm today's [facility] visit.  

## 2018-04-08 ENCOUNTER — Telehealth (HOSPITAL_COMMUNITY): Payer: Self-pay

## 2018-04-08 DIAGNOSIS — D649 Anemia, unspecified: Secondary | ICD-10-CM | POA: Diagnosis not present

## 2018-04-08 DIAGNOSIS — M1990 Unspecified osteoarthritis, unspecified site: Secondary | ICD-10-CM | POA: Diagnosis not present

## 2018-04-08 DIAGNOSIS — I878 Other specified disorders of veins: Secondary | ICD-10-CM | POA: Diagnosis not present

## 2018-04-08 DIAGNOSIS — I110 Hypertensive heart disease with heart failure: Secondary | ICD-10-CM | POA: Diagnosis not present

## 2018-04-08 DIAGNOSIS — R296 Repeated falls: Secondary | ICD-10-CM | POA: Diagnosis not present

## 2018-04-08 DIAGNOSIS — I951 Orthostatic hypotension: Secondary | ICD-10-CM | POA: Diagnosis not present

## 2018-04-08 DIAGNOSIS — I429 Cardiomyopathy, unspecified: Secondary | ICD-10-CM | POA: Diagnosis not present

## 2018-04-08 DIAGNOSIS — E1142 Type 2 diabetes mellitus with diabetic polyneuropathy: Secondary | ICD-10-CM | POA: Diagnosis not present

## 2018-04-08 DIAGNOSIS — I5022 Chronic systolic (congestive) heart failure: Secondary | ICD-10-CM | POA: Diagnosis not present

## 2018-04-08 NOTE — Telephone Encounter (Signed)
A text message sent to pt's husband cell phone with instructions giving by Levada Dy from the heart failure clinic.  Levada Dy stated that pt is to take a extra toresmide for the next two days. He stated that he understood and will take care of it. Regular [facility] visit scheduled for next unless anything changes.

## 2018-04-08 NOTE — Telephone Encounter (Signed)
I called pt but no answer so I called Dee and notified her. Verbalizes understanding.

## 2018-04-12 DIAGNOSIS — I110 Hypertensive heart disease with heart failure: Secondary | ICD-10-CM | POA: Diagnosis not present

## 2018-04-12 DIAGNOSIS — E1142 Type 2 diabetes mellitus with diabetic polyneuropathy: Secondary | ICD-10-CM | POA: Diagnosis not present

## 2018-04-12 DIAGNOSIS — I951 Orthostatic hypotension: Secondary | ICD-10-CM | POA: Diagnosis not present

## 2018-04-12 DIAGNOSIS — R296 Repeated falls: Secondary | ICD-10-CM | POA: Diagnosis not present

## 2018-04-12 DIAGNOSIS — I429 Cardiomyopathy, unspecified: Secondary | ICD-10-CM | POA: Diagnosis not present

## 2018-04-12 DIAGNOSIS — I5022 Chronic systolic (congestive) heart failure: Secondary | ICD-10-CM | POA: Diagnosis not present

## 2018-04-12 DIAGNOSIS — I878 Other specified disorders of veins: Secondary | ICD-10-CM | POA: Diagnosis not present

## 2018-04-12 DIAGNOSIS — D649 Anemia, unspecified: Secondary | ICD-10-CM | POA: Diagnosis not present

## 2018-04-12 DIAGNOSIS — M1990 Unspecified osteoarthritis, unspecified site: Secondary | ICD-10-CM | POA: Diagnosis not present

## 2018-04-13 ENCOUNTER — Other Ambulatory Visit (HOSPITAL_COMMUNITY): Payer: Self-pay

## 2018-04-13 DIAGNOSIS — I951 Orthostatic hypotension: Secondary | ICD-10-CM | POA: Diagnosis not present

## 2018-04-13 DIAGNOSIS — I429 Cardiomyopathy, unspecified: Secondary | ICD-10-CM | POA: Diagnosis not present

## 2018-04-13 DIAGNOSIS — I878 Other specified disorders of veins: Secondary | ICD-10-CM | POA: Diagnosis not present

## 2018-04-13 DIAGNOSIS — D649 Anemia, unspecified: Secondary | ICD-10-CM | POA: Diagnosis not present

## 2018-04-13 DIAGNOSIS — I5022 Chronic systolic (congestive) heart failure: Secondary | ICD-10-CM | POA: Diagnosis not present

## 2018-04-13 DIAGNOSIS — M1990 Unspecified osteoarthritis, unspecified site: Secondary | ICD-10-CM | POA: Diagnosis not present

## 2018-04-13 DIAGNOSIS — E1142 Type 2 diabetes mellitus with diabetic polyneuropathy: Secondary | ICD-10-CM | POA: Diagnosis not present

## 2018-04-13 DIAGNOSIS — R296 Repeated falls: Secondary | ICD-10-CM | POA: Diagnosis not present

## 2018-04-13 DIAGNOSIS — I110 Hypertensive heart disease with heart failure: Secondary | ICD-10-CM | POA: Diagnosis not present

## 2018-04-13 NOTE — Progress Notes (Signed)
Paramedicine Encounter    Patient ID: Rhonda [name], female    DOB: 1950/09/25, 67 y.o.   MRN: [id number]    Patient Care Team: Maurice Small, MD as PCP - General (Family Medicine)  Patient Active Problem List   Diagnosis Date Noted  . Orthostatic hypotension 07/28/2017  . SVD (spontaneous vaginal delivery)   . Peripheral neuropathy   . On home oxygen therapy   . Migraines   . Left bundle branch block   . Hypothyroidism   . Hypertension   . Hyperlipidemia   . Heart murmur   . GERD (gastroesophageal reflux disease)   . Exertional shortness of breath   . Depression   . Complication of anesthesia   . Back pain   . Arthritis   . Anxiety   . Anemia   . HTN (hypertension) 09/14/2013  . Chronic respiratory failure ([place]) 09/14/2013  . 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 Morbid obesity ([place]) 11/01/2012  . Chronic systolic heart failure ([place]) 10/27/2012  . S/P right TKA 05/10/2012  . Endometrial polyp 01/20/2012  . Breast cancer, stage 1 ([place]) 03/26/2011  . Unspecified vitamin D deficiency 03/26/2011    Current Outpatient Medications:  .  acetaZOLAMIDE (DIAMOX) 125 MG tablet, TAKE 1 TABLET(125 MG) BY MOUTH DAILY, Disp: 30 tablet, Rfl: 5 .  buPROPion (WELLBUTRIN XL) 150 MG 24 hr tablet, Take 450 mg by mouth every morning. , Disp: , Rfl:  .  citalopram (CELEXA) 10 MG tablet, Take 10 mg by mouth daily. , Disp: , Rfl:  .  clonazePAM (KLONOPIN) 0.5 MG tablet, Take 1 mg by mouth daily. , Disp: , Rfl:  .  fluticasone (FLONASE) 50 MCG/ACT nasal spray, INSTILL 2 SPRAYS INTO EACH NOSTRIL ONCE DAILY, Disp: , Rfl: 7 .  gabapentin (NEURONTIN) 300 MG capsule, Take 300 mg by mouth 3 (three) times daily. , Disp: , Rfl:  .  gabapentin (NEURONTIN) 800 MG tablet, Take 800 mg by mouth as needed., Disp: , Rfl:  .  ipratropium (ATROVENT) 0.03 % nasal spray, INSTILL 2 SPRAYS INTO EACH NOSTRIL TWICE A DAY, Disp: , Rfl: 10 .  levothyroxine (SYNTHROID,  LEVOTHROID) 100 MCG tablet, Take 1 tablet (100 mcg total) by mouth daily., Disp: 45 tablet, Rfl: 3 .  metolazone (ZAROXOLYN) 2.5 MG tablet, take 1 tablet by mouth if needed for FLUID OR EDEMA, Disp: 30 tablet, Rfl: 2 .  midodrine (PROAMATINE) 10 MG tablet, Take 1 tablet (10 mg total) by mouth 3 (three) times daily with meals., Disp: 90 tablet, Rfl: 3 .  montelukast (SINGULAIR) 10 MG tablet, Take 10 mg by mouth at bedtime. , Disp: , Rfl: 1 .  potassium chloride SA (K-DUR,KLOR-CON) 20 MEQ tablet, Take 3 tablets (60 mEq total) by mouth 2 (two) times daily., Disp: 180 tablet, Rfl: 11 .  simvastatin (ZOCOR) 10 MG tablet, Take 10 mg by mouth every evening. , Disp: , Rfl:  .  spironolactone (ALDACTONE) 25 MG tablet, Take 0.5 tablets (12.5 mg total) by mouth daily., Disp: 45 tablet, Rfl: 3 .  torsemide (DEMADEX) 20 MG tablet, Take 3 tablets (60 mg total) by mouth daily., Disp: 180 tablet, Rfl: 3 .  ALPRAZolam (XANAX) 0.25 MG tablet, , Disp: , Rfl: 0 .  ARIPiprazole (ABILIFY) 2 MG tablet, Take 2 mg by mouth every morning. , Disp: , Rfl:  .  BISACODYL 5 MG EC tablet, TK UTD, Disp: , Rfl: 0 .  clindamycin (CLEOCIN) 300 MG capsule, Take 600 mg by mouth as  needed (1 hour prior to dental work)., Disp: , Rfl:  .  febuxostat (ULORIC) 40 MG tablet, Take 40 mg by mouth at bedtime. , Disp: , Rfl:  .  fexofenadine (ALLEGRA) 180 MG tablet, Take 180 mg by mouth 2 (two) times daily., Disp: , Rfl:  .  metroNIDAZOLE (FLAGYL) 500 MG tablet, TK 1 T PO TID FOR 10 DAYS, Disp: , Rfl: 0 .  zolpidem (AMBIEN) 10 MG tablet, Take 10 mg by mouth at bedtime as needed. For sleep, Disp: , Rfl:   Current Facility-Administered Medications:  .  triamcinolone acetonide (KENALOG-40) injection 20 mg, 20 mg, Other, Once, Landis Martins, DPM Allergies  Allergen Reactions  . Ceftin Anaphylaxis    Face and throat swell   . Shellfish Allergy Other (See Comments)    Gout exacerbation  . Allopurinol Nausea Only and Other (See Comments)     weakness  . Ativan [Lorazepam] Itching  . Lorazepam Itching  . Sulfa Antibiotics Itching  . Ultram [Tramadol Hcl] Itching     Social History   Socioeconomic History  . Marital status: Married    Spouse name: Not on file  . Number of children: 2  . Years of education: masters  . Highest education level: Not on file  Occupational History  . Not on file  Social Needs  . Financial resource strain: Not on file  . Food insecurity:    Worry: Not on file    Inability: Not on file  . Transportation needs:    Medical: Not on file    Non-medical: Not on file  Tobacco Use  . Smoking status: Former Smoker    Packs/day: 0.10    Years: 26.00    Pack years: 2.60    Types: Cigarettes    Last attempt to quit: 05/06/2007    Years since quitting: 10.9  . Smokeless tobacco: Never Used  Substance and Sexual Activity  . Alcohol use: No  . Drug use: No  . Sexual activity: Yes  Lifestyle  . Physical activity:    Days per week: Not on file    Minutes per session: Not on file  . Stress: Not on file  Relationships  . Social connections:    Talks on phone: Not on file    Gets together: Not on file    Attends religious service: Not on file    Active member of club or organization: Not on file    Attends meetings of clubs or organizations: Not on file    Relationship status: Not on file  . Intimate partner violence:    Fear of current or ex partner: Not on file    Emotionally abused: Not on file    Physically abused: Not on file    Forced sexual activity: Not on file  Other Topics Concern  . Not on file  Social History Narrative   Tobacco Use Cigarettes: Former Smoker, Quit in 2008   No Alcohol   No recreational drug use   Diet: Regular/Low Carb   Exercise: None   Occupation: disabled   Education: Research officer, political party, masters   Children: 2   Firearms: No   Therapist, art Use: Always   Former Metallurgist.        Physical Exam      Future Appointments  Date Time Provider  [place]  04/28/2018  7:00 AM [facility] DEVICE REMOTES [facility]  06/09/2018 10:30 AM Philemon Kingdom, MD [facility] None  06/15/2018  1:45 PM Marzetta Board, DPM  [facility]  06/30/2018 10:00 AM [facility] E[facility] 1-[facility]  06/30/2018 11:00 AM Bensimhon, Shaune Pascal, MD [facility] None  07/18/2018  8:05 AM [facility] DEVICE REMOTES [facility]  07/27/2018  8:50 AM Patel, Arvin Collard K, DO [facility] None     BP (!) 142/80 (BP Location: Right Arm, Patient Position: Sitting, Cuff Size: Large)   Pulse 69   Wt 195 lb (88.5 kg)   SpO2 98%   BMI 38.08 kg/m   Weight yesterday-194 11/18 Last visit weight-202  ATF pt CAO x4 with no complaints. Pt stated that she continues having memory issues and forgetting chores and errands.  Pt denies headache, chest pain and sob. Pt stated that she still feel "dizzy sometimes".  Pt has taken all of her medications without missing any. rx bottles verified and pill box refilled.    Medication ordered: Fexofenadine filled until sat  febuxostat (uloric) acetazolamide  Rhonda [name], EMT Paramedic [phone] 04/20/2018    ACTION: Home visit completed

## 2018-04-14 DIAGNOSIS — I878 Other specified disorders of veins: Secondary | ICD-10-CM | POA: Diagnosis not present

## 2018-04-14 DIAGNOSIS — D649 Anemia, unspecified: Secondary | ICD-10-CM | POA: Diagnosis not present

## 2018-04-14 DIAGNOSIS — E1142 Type 2 diabetes mellitus with diabetic polyneuropathy: Secondary | ICD-10-CM | POA: Diagnosis not present

## 2018-04-14 DIAGNOSIS — R296 Repeated falls: Secondary | ICD-10-CM | POA: Diagnosis not present

## 2018-04-14 DIAGNOSIS — M1990 Unspecified osteoarthritis, unspecified site: Secondary | ICD-10-CM | POA: Diagnosis not present

## 2018-04-14 DIAGNOSIS — I429 Cardiomyopathy, unspecified: Secondary | ICD-10-CM | POA: Diagnosis not present

## 2018-04-14 DIAGNOSIS — I110 Hypertensive heart disease with heart failure: Secondary | ICD-10-CM | POA: Diagnosis not present

## 2018-04-14 DIAGNOSIS — I951 Orthostatic hypotension: Secondary | ICD-10-CM | POA: Diagnosis not present

## 2018-04-14 DIAGNOSIS — I5022 Chronic systolic (congestive) heart failure: Secondary | ICD-10-CM | POA: Diagnosis not present

## 2018-04-15 DIAGNOSIS — R197 Diarrhea, unspecified: Secondary | ICD-10-CM | POA: Diagnosis not present

## 2018-04-18 ENCOUNTER — Ambulatory Visit: Payer: Medicare HMO

## 2018-04-18 ENCOUNTER — Ambulatory Visit (INDEPENDENT_AMBULATORY_CARE_PROVIDER_SITE_OTHER): Payer: Medicare HMO

## 2018-04-18 DIAGNOSIS — I5032 Chronic diastolic (congestive) heart failure: Secondary | ICD-10-CM | POA: Diagnosis not present

## 2018-04-18 DIAGNOSIS — I428 Other cardiomyopathies: Secondary | ICD-10-CM

## 2018-04-18 DIAGNOSIS — Z95810 Presence of automatic (implantable) cardiac defibrillator: Secondary | ICD-10-CM

## 2018-04-18 NOTE — Progress Notes (Signed)
EPIC Encounter for ICM Monitoring  Patient Name: Rhonda [name] is a 67 y.o. female Date: 04/18/2018 Primary Care Physican: Maurice Small, MD Primary Cardiologist:Bensimhon Electrophysiologist: Santina Evans Pacing: 100% Last Weight:191lbs  Today's Weight: 190 lbs       Heart Failure questions reviewed, pt asymptomatic.  Pt is being tested for C-Diff due to diarrhea.  She said she is drinking a lot of fluids because Nephrologist told her to increase fluids.   She is drinking more than 64 oz a day.  Patient in paramedicine program to help with medications.      Thoracic impedance abnormal suggesting fluid accumulation.  Prescribed: Torsemide 20 mg3tablets (60 mg total)once a day.Per 03/30/2018 HF office note, take an extra 60 mg PRN. Potassium 20 meq 3tablets (60 mEq total)twice a day. Metolazone 2.3m 1 tablet by mouth if needed for fluid or edema.  Labs: 03/30/2018 Creatinine 1.63, BUN 26, Potassium 3.9, Sodium 139, eGFR 32-37 02/10/2018 Creatinine 1.85, BUN 41, Potassium 3.9, Sodium 138, EGFR 28-33 10/29/2017 Creatinine 1.45, BUN 24, Potassium 3.9, Sodium 141, EGFR 37-43 10/21/2017 Creatinine 1.57, BUN 19, Potassium 3.8, Sodium 140, EGFR 34-39 09/23/2017 Creatinine 1.26, BUN 29, Potassium 3.2, Sodium 142, EGFR 49-56 09/09/2017 Creatinine 1.22, BUN 20, Potassium 3.4, Sodium 140, EGFR 46-53 08/25/2017 Creatinine 1.52, BUN 47, Potassium 3.1, Sodium 137, EGFR 35-41 07/28/2017 Creatinine 1.69, BUN 53, Potassium 3.0, Sodium 139, EGFR 31-36 (low potassium addressed by HF clinic) 06/21/2017 Creatinine 2.06, BUN 38, Potassium 2.5, Sodium 136, EGFR 24-28  Recommendations: No changes.  Will continue to monitor.   Encouraged to call for fluid symptoms.  Follow-up plan: ICM clinic phone appointment on 04/28/2018 to recheck fluid levels.       Copy of ICM check sent to Dr. TLovena Leand Dr BHaroldine Laws   3 month ICM trend: 04/18/2018    1 Year ICM trend:       [name]  RN 04/18/2018 2:23 PM

## 2018-04-18 NOTE — Progress Notes (Signed)
Carelink Summary Report / Loop Recorder 

## 2018-04-19 DIAGNOSIS — I951 Orthostatic hypotension: Secondary | ICD-10-CM | POA: Diagnosis not present

## 2018-04-19 DIAGNOSIS — I110 Hypertensive heart disease with heart failure: Secondary | ICD-10-CM | POA: Diagnosis not present

## 2018-04-19 DIAGNOSIS — I429 Cardiomyopathy, unspecified: Secondary | ICD-10-CM | POA: Diagnosis not present

## 2018-04-19 DIAGNOSIS — I878 Other specified disorders of veins: Secondary | ICD-10-CM | POA: Diagnosis not present

## 2018-04-19 DIAGNOSIS — I5022 Chronic systolic (congestive) heart failure: Secondary | ICD-10-CM | POA: Diagnosis not present

## 2018-04-19 DIAGNOSIS — M1990 Unspecified osteoarthritis, unspecified site: Secondary | ICD-10-CM | POA: Diagnosis not present

## 2018-04-19 DIAGNOSIS — E1142 Type 2 diabetes mellitus with diabetic polyneuropathy: Secondary | ICD-10-CM | POA: Diagnosis not present

## 2018-04-19 DIAGNOSIS — D649 Anemia, unspecified: Secondary | ICD-10-CM | POA: Diagnosis not present

## 2018-04-19 DIAGNOSIS — R296 Repeated falls: Secondary | ICD-10-CM | POA: Diagnosis not present

## 2018-04-20 ENCOUNTER — Other Ambulatory Visit (HOSPITAL_COMMUNITY): Payer: Self-pay

## 2018-04-20 ENCOUNTER — Telehealth (HOSPITAL_COMMUNITY): Payer: Self-pay

## 2018-04-20 DIAGNOSIS — I429 Cardiomyopathy, unspecified: Secondary | ICD-10-CM | POA: Diagnosis not present

## 2018-04-20 DIAGNOSIS — M1990 Unspecified osteoarthritis, unspecified site: Secondary | ICD-10-CM | POA: Diagnosis not present

## 2018-04-20 DIAGNOSIS — R296 Repeated falls: Secondary | ICD-10-CM | POA: Diagnosis not present

## 2018-04-20 DIAGNOSIS — I5022 Chronic systolic (congestive) heart failure: Secondary | ICD-10-CM | POA: Diagnosis not present

## 2018-04-20 DIAGNOSIS — I951 Orthostatic hypotension: Secondary | ICD-10-CM | POA: Diagnosis not present

## 2018-04-20 DIAGNOSIS — E1142 Type 2 diabetes mellitus with diabetic polyneuropathy: Secondary | ICD-10-CM | POA: Diagnosis not present

## 2018-04-20 DIAGNOSIS — D649 Anemia, unspecified: Secondary | ICD-10-CM | POA: Diagnosis not present

## 2018-04-20 DIAGNOSIS — I878 Other specified disorders of veins: Secondary | ICD-10-CM | POA: Diagnosis not present

## 2018-04-20 DIAGNOSIS — I110 Hypertensive heart disease with heart failure: Secondary | ICD-10-CM | POA: Diagnosis not present

## 2018-04-20 NOTE — Progress Notes (Signed)
Paramedicine Encounter    Patient ID: Rhonda [name], female    DOB: 1950/06/30, 67 y.o.   MRN: [id number]    Patient Care Team: Maurice Small, MD as PCP - General (Family Medicine)  Patient Active Problem List   Diagnosis Date Noted  . Orthostatic hypotension 07/28/2017  . SVD (spontaneous vaginal delivery)   . Peripheral neuropathy   . On home oxygen therapy   . Migraines   . Left bundle branch block   . Hypothyroidism   . Hypertension   . Hyperlipidemia   . Heart murmur   . GERD (gastroesophageal reflux disease)   . Exertional shortness of breath   . Depression   . Complication of anesthesia   . Back pain   . Arthritis   . Anxiety   . Anemia   . HTN (hypertension) 09/14/2013  . Chronic respiratory failure ([place]) 09/14/2013  . 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 Morbid obesity ([place]) 11/01/2012  . Chronic systolic heart failure ([place]) 10/27/2012  . S/P right TKA 05/10/2012  . Endometrial polyp 01/20/2012  . Breast cancer, stage 1 ([place]) 03/26/2011  . Unspecified vitamin D deficiency 03/26/2011    Current Outpatient Medications:  .  buPROPion (WELLBUTRIN XL) 150 MG 24 hr tablet, Take 450 mg by mouth every morning. , Disp: , Rfl:  .  citalopram (CELEXA) 10 MG tablet, Take 10 mg by mouth daily. , Disp: , Rfl:  .  clonazePAM (KLONOPIN) 0.5 MG tablet, Take 1 mg by mouth daily. , Disp: , Rfl:  .  gabapentin (NEURONTIN) 300 MG capsule, Take 300 mg by mouth 3 (three) times daily. , Disp: , Rfl:  .  levothyroxine (SYNTHROID, LEVOTHROID) 100 MCG tablet, Take 1 tablet (100 mcg total) by mouth daily., Disp: 45 tablet, Rfl: 3 .  midodrine (PROAMATINE) 10 MG tablet, Take 1 tablet (10 mg total) by mouth 3 (three) times daily with meals., Disp: 90 tablet, Rfl: 3 .  montelukast (SINGULAIR) 10 MG tablet, Take 10 mg by mouth at bedtime. , Disp: , Rfl: 1 .  potassium chloride SA (K-DUR,KLOR-CON) 20 MEQ tablet, Take 3 tablets (60 mEq total) by mouth  2 (two) times daily., Disp: 180 tablet, Rfl: 11 .  simvastatin (ZOCOR) 10 MG tablet, Take 10 mg by mouth every evening. , Disp: , Rfl:  .  spironolactone (ALDACTONE) 25 MG tablet, Take 0.5 tablets (12.5 mg total) by mouth daily., Disp: 45 tablet, Rfl: 3 .  torsemide (DEMADEX) 20 MG tablet, Take 3 tablets (60 mg total) by mouth daily., Disp: 180 tablet, Rfl: 3 .  zolpidem (AMBIEN) 10 MG tablet, Take 10 mg by mouth at bedtime as needed. For sleep, Disp: , Rfl:  .  acetaZOLAMIDE (DIAMOX) 125 MG tablet, TAKE 1 TABLET(125 MG) BY MOUTH DAILY, Disp: 30 tablet, Rfl: 5 .  ALPRAZolam (XANAX) 0.25 MG tablet, , Disp: , Rfl: 0 .  ARIPiprazole (ABILIFY) 2 MG tablet, Take 2 mg by mouth every morning. , Disp: , Rfl:  .  BISACODYL 5 MG EC tablet, TK UTD, Disp: , Rfl: 0 .  clindamycin (CLEOCIN) 300 MG capsule, Take 600 mg by mouth as needed (1 hour prior to dental work)., Disp: , Rfl:  .  febuxostat (ULORIC) 40 MG tablet, Take 40 mg by mouth at bedtime. , Disp: , Rfl:  .  fexofenadine (ALLEGRA) 180 MG tablet, Take 180 mg by mouth 2 (two) times daily., Disp: , Rfl:  .  fluticasone (FLONASE) 50 MCG/ACT nasal spray, INSTILL 2  SPRAYS INTO EACH NOSTRIL ONCE DAILY, Disp: , Rfl: 7 .  gabapentin (NEURONTIN) 800 MG tablet, Take 800 mg by mouth as needed., Disp: , Rfl:  .  ipratropium (ATROVENT) 0.03 % nasal spray, INSTILL 2 SPRAYS INTO EACH NOSTRIL TWICE A DAY, Disp: , Rfl: 10 .  metolazone (ZAROXOLYN) 2.5 MG tablet, take 1 tablet by mouth if needed for FLUID OR EDEMA, Disp: 30 tablet, Rfl: 2 .  metroNIDAZOLE (FLAGYL) 500 MG tablet, TK 1 T PO TID FOR 10 DAYS, Disp: , Rfl: 0  Current Facility-Administered Medications:  .  triamcinolone acetonide (KENALOG-40) injection 20 mg, 20 mg, Other, Once, Landis Martins, DPM Allergies  Allergen Reactions  . Ceftin Anaphylaxis    Face and throat swell   . Shellfish Allergy Other (See Comments)    Gout exacerbation  . Allopurinol Nausea Only and Other (See Comments)    weakness   . Ativan [Lorazepam] Itching  . Lorazepam Itching  . Sulfa Antibiotics Itching  . Ultram [Tramadol Hcl] Itching     Social History   Socioeconomic History  . Marital status: Married    Spouse name: Not on file  . Number of children: 2  . Years of education: masters  . Highest education level: Not on file  Occupational History  . Not on file  Social Needs  . Financial resource strain: Not on file  . Food insecurity:    Worry: Not on file    Inability: Not on file  . Transportation needs:    Medical: Not on file    Non-medical: Not on file  Tobacco Use  . Smoking status: Former Smoker    Packs/day: 0.10    Years: 26.00    Pack years: 2.60    Types: Cigarettes    Last attempt to quit: 05/06/2007    Years since quitting: 10.9  . Smokeless tobacco: Never Used  Substance and Sexual Activity  . Alcohol use: No  . Drug use: No  . Sexual activity: Yes  Lifestyle  . Physical activity:    Days per week: Not on file    Minutes per session: Not on file  . Stress: Not on file  Relationships  . Social connections:    Talks on phone: Not on file    Gets together: Not on file    Attends religious service: Not on file    Active member of club or organization: Not on file    Attends meetings of clubs or organizations: Not on file    Relationship status: Not on file  . Intimate partner violence:    Fear of current or ex partner: Not on file    Emotionally abused: Not on file    Physically abused: Not on file    Forced sexual activity: Not on file  Other Topics Concern  . Not on file  Social History Narrative   Tobacco Use Cigarettes: Former Smoker, Quit in 2008   No Alcohol   No recreational drug use   Diet: Regular/Low Carb   Exercise: None   Occupation: disabled   Education: Research officer, political party, masters   Children: 2   Firearms: No   Therapist, art Use: Always   Former Metallurgist.        Physical Exam  Pulmonary/Chest: Effort normal. No respiratory distress.   Abdominal: Soft. She exhibits no distension. There is no guarding.  Skin: Skin is warm and dry. She is not diaphoretic.        Future Appointments  Date Time  Provider [place]  04/28/2018  7:00 AM [facility] DEVICE REMOTES [facility]  06/09/2018 10:30 AM Philemon Kingdom, MD [facility] None  06/15/2018  1:45 PM Marzetta Board, DPM [facility]  06/30/2018 10:00 AM [facility] ECHO 1-BUZZ [facility]  06/30/2018 11:00 AM Bensimhon, Shaune Pascal, MD [facility] None  07/18/2018  8:05 AM [facility] DEVICE REMOTES [facility]  07/27/2018  8:50 AM Patel, Arvin Collard K, DO [facility] None     Pulse 87   Resp 16   Wt 195 lb 8 oz (88.7 kg)   SpO2 98%   BMI 38.18 kg/m  BP 148/p  Weight yesterday-193 Last visit weight-195  ATF pt CAO x4 cooking; pt has no complaints.  Pt has taken all of her medications for the week. Pt stated that she will be more active today due to cooking for the holiday. She denies sob, chest pain and dizziness. Pt continues to watch her sodium and fluid intake. She stated that she is still having symptoms from C-Diff although her pcp said that she "was over it".  Pt has an upcoming appointment with GI specialist. rx bottles confirmed and pill box refilled.    Medication ordered: none  Gopal Malter, EMT Paramedic [phone] 04/20/2018    ACTION: Home visit completed

## 2018-04-20 NOTE — Telephone Encounter (Signed)
Pt called to confirm today's CHP visit for today; she agrees

## 2018-04-25 NOTE — Progress Notes (Signed)
Patient has and ICD not a loop recorder

## 2018-04-25 NOTE — Addendum Note (Signed)
Addended by: [name] on: 04/25/2018 10:30 AM   Modules accepted: Level of Service

## 2018-04-26 ENCOUNTER — Telehealth: Payer: Self-pay | Admitting: *Deleted

## 2018-04-26 DIAGNOSIS — I878 Other specified disorders of veins: Secondary | ICD-10-CM | POA: Diagnosis not present

## 2018-04-26 DIAGNOSIS — I951 Orthostatic hypotension: Secondary | ICD-10-CM | POA: Diagnosis not present

## 2018-04-26 DIAGNOSIS — I5022 Chronic systolic (congestive) heart failure: Secondary | ICD-10-CM | POA: Diagnosis not present

## 2018-04-26 DIAGNOSIS — M1990 Unspecified osteoarthritis, unspecified site: Secondary | ICD-10-CM | POA: Diagnosis not present

## 2018-04-26 DIAGNOSIS — E1142 Type 2 diabetes mellitus with diabetic polyneuropathy: Secondary | ICD-10-CM | POA: Diagnosis not present

## 2018-04-26 DIAGNOSIS — D649 Anemia, unspecified: Secondary | ICD-10-CM | POA: Diagnosis not present

## 2018-04-26 DIAGNOSIS — I429 Cardiomyopathy, unspecified: Secondary | ICD-10-CM | POA: Diagnosis not present

## 2018-04-26 DIAGNOSIS — R296 Repeated falls: Secondary | ICD-10-CM | POA: Diagnosis not present

## 2018-04-26 DIAGNOSIS — I110 Hypertensive heart disease with heart failure: Secondary | ICD-10-CM | POA: Diagnosis not present

## 2018-04-26 NOTE — Telephone Encounter (Signed)
Rhonda [name] received a call this morning from this patient inquiring about the letter she received (Dr. Si Raider leaving).  I called patient back and left a detailed message explaining that we would be happy to refer pt to see another neuropsychologist.  If patient calls back, patient may ask for Rhonda [name] or Rhonda [name].

## 2018-04-27 ENCOUNTER — Telehealth (HOSPITAL_COMMUNITY): Payer: Self-pay

## 2018-04-27 ENCOUNTER — Other Ambulatory Visit: Payer: Self-pay | Admitting: Gastroenterology

## 2018-04-28 ENCOUNTER — Telehealth (HOSPITAL_COMMUNITY): Payer: Self-pay

## 2018-04-28 ENCOUNTER — Other Ambulatory Visit (HOSPITAL_COMMUNITY): Payer: Self-pay

## 2018-04-28 ENCOUNTER — Ambulatory Visit (INDEPENDENT_AMBULATORY_CARE_PROVIDER_SITE_OTHER): Payer: Medicare HMO

## 2018-04-28 DIAGNOSIS — I5032 Chronic diastolic (congestive) heart failure: Secondary | ICD-10-CM

## 2018-04-28 DIAGNOSIS — Z95810 Presence of automatic (implantable) cardiac defibrillator: Secondary | ICD-10-CM

## 2018-04-28 NOTE — Telephone Encounter (Signed)
Pt called to reschedule CHP visit

## 2018-04-28 NOTE — Telephone Encounter (Signed)
Pt called to confirm this morning CHP visit.

## 2018-04-28 NOTE — Progress Notes (Signed)
EPIC Encounter for ICM Monitoring  Patient Name: Rhonda [name] is a 67 y.o. female Date: 04/28/2018 Primary Care Physican: Maurice Small, MD Primary Cardiologist:Bensimhon Electrophysiologist: Santina Evans Pacing: 99.9% Last Weight:190lbs  Today's Weight: 188.7 lbs         Heart Failure questions reviewed, pt asymptomatic.   Thoracic impedance improved since 04/18/2018 remote transmission normal but was abnormal suggesting fluid accumulation.  Prescribed: Torsemide 20 mg3tablets (60 mg total)once a day.Per 03/30/2018 HF office note, take an extra 60 mg PRN.Potassium 20 meq 3tablets (60 mEq total)twice a day. Metolazone 2.71m 1 tablet by mouth if needed for fluid or edema.  Labs: 03/30/2018 Creatinine 1.63, BUN 26, Potassium 3.9, Sodium 139, eGFR 32-37 02/10/2018 Creatinine 1.85, BUN 41, Potassium 3.9, Sodium 138, EGFR 28-33 10/29/2017 Creatinine 1.45, BUN 24, Potassium 3.9, Sodium 141, EGFR 37-43 10/21/2017 Creatinine 1.57, BUN 19, Potassium 3.8, Sodium 140, EGFR 34-39 09/23/2017 Creatinine 1.26, BUN 29, Potassium 3.2, Sodium 142, EGFR 49-56 09/09/2017 Creatinine 1.22, BUN 20, Potassium 3.4, Sodium 140, EGFR 46-53 08/25/2017 Creatinine 1.52, BUN 47, Potassium 3.1, Sodium 137, EGFR 35-41 07/28/2017 Creatinine 1.69, BUN 53, Potassium 3.0, Sodium 139, EGFR 31-36 (low potassium addressed by HF clinic) 06/21/2017 Creatinine 2.06, BUN 38, Potassium 2.5, Sodium 136, EGFR 24-28  Recommendations: No changes.   Encouraged to call for fluid symptoms.  Follow-up plan: ICM clinic phone appointment on 05/19/2018.     Copy of ICM check sent to Dr. TLovena Leand Dr BHaroldine Laws   3 month ICM trend: 04/28/2018    1 Year ICM trend:       [name] RN 04/28/2018 3:35 PM

## 2018-04-28 NOTE — Progress Notes (Signed)
Paramedicine Encounter    Patient ID: Rhonda [name], female    DOB: Oct 20, 1950, 67 y.o.   MRN: [id number]    Patient Care Team: Maurice Small, MD as PCP - General (Family Medicine)  Patient Active Problem List   Diagnosis Date Noted  . Orthostatic hypotension 07/28/2017  . SVD (spontaneous vaginal delivery)   . Peripheral neuropathy   . On home oxygen therapy   . Migraines   . Left bundle branch block   . Hypothyroidism   . Hypertension   . Hyperlipidemia   . Heart murmur   . GERD (gastroesophageal reflux disease)   . Exertional shortness of breath   . Depression   . Complication of anesthesia   . Back pain   . Arthritis   . Anxiety   . Anemia   . HTN (hypertension) 09/14/2013  . Chronic respiratory failure ([place]) 09/14/2013  . 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 Morbid obesity ([place]) 11/01/2012  . Chronic systolic heart failure ([place]) 10/27/2012  . S/P right TKA 05/10/2012  . Endometrial polyp 01/20/2012  . Breast cancer, stage 1 ([place]) 03/26/2011  . Unspecified vitamin D deficiency 03/26/2011    Current Outpatient Medications:  .  acetaZOLAMIDE (DIAMOX) 125 MG tablet, TAKE 1 TABLET(125 MG) BY MOUTH DAILY, Disp: 30 tablet, Rfl: 5 .  buPROPion (WELLBUTRIN XL) 150 MG 24 hr tablet, Take 450 mg by mouth every morning. , Disp: , Rfl:  .  citalopram (CELEXA) 10 MG tablet, Take 10 mg by mouth daily. , Disp: , Rfl:  .  clonazePAM (KLONOPIN) 0.5 MG tablet, Take 1 mg by mouth daily. , Disp: , Rfl:  .  fexofenadine (ALLEGRA) 180 MG tablet, Take 180 mg by mouth 2 (two) times daily., Disp: , Rfl:  .  gabapentin (NEURONTIN) 300 MG capsule, Take 300 mg by mouth 3 (three) times daily. , Disp: , Rfl:  .  gabapentin (NEURONTIN) 800 MG tablet, Take 800 mg by mouth as needed., Disp: , Rfl:  .  levothyroxine (SYNTHROID, LEVOTHROID) 100 MCG tablet, Take 1 tablet (100 mcg total) by mouth daily., Disp: 45 tablet, Rfl: 3 .  metolazone (ZAROXOLYN) 2.5  MG tablet, take 1 tablet by mouth if needed for FLUID OR EDEMA, Disp: 30 tablet, Rfl: 2 .  midodrine (PROAMATINE) 10 MG tablet, Take 1 tablet (10 mg total) by mouth 3 (three) times daily with meals., Disp: 90 tablet, Rfl: 3 .  montelukast (SINGULAIR) 10 MG tablet, Take 10 mg by mouth at bedtime. , Disp: , Rfl: 1 .  potassium chloride SA (K-DUR,KLOR-CON) 20 MEQ tablet, Take 3 tablets (60 mEq total) by mouth 2 (two) times daily., Disp: 180 tablet, Rfl: 11 .  simvastatin (ZOCOR) 10 MG tablet, Take 10 mg by mouth every evening. , Disp: , Rfl:  .  spironolactone (ALDACTONE) 25 MG tablet, Take 0.5 tablets (12.5 mg total) by mouth daily., Disp: 45 tablet, Rfl: 3 .  torsemide (DEMADEX) 20 MG tablet, Take 3 tablets (60 mg total) by mouth daily., Disp: 180 tablet, Rfl: 3 .  zolpidem (AMBIEN) 10 MG tablet, Take 10 mg by mouth at bedtime as needed. For sleep, Disp: , Rfl:  .  ALPRAZolam (XANAX) 0.25 MG tablet, , Disp: , Rfl: 0 .  ARIPiprazole (ABILIFY) 2 MG tablet, Take 2 mg by mouth every morning. , Disp: , Rfl:  .  BISACODYL 5 MG EC tablet, TK UTD, Disp: , Rfl: 0 .  clindamycin (CLEOCIN) 300 MG capsule, Take 600 mg by mouth  as needed (1 hour prior to dental work)., Disp: , Rfl:  .  febuxostat (ULORIC) 40 MG tablet, Take 40 mg by mouth at bedtime. , Disp: , Rfl:  .  fluticasone (FLONASE) 50 MCG/ACT nasal spray, INSTILL 2 SPRAYS INTO EACH NOSTRIL ONCE DAILY, Disp: , Rfl: 7 .  ipratropium (ATROVENT) 0.03 % nasal spray, INSTILL 2 SPRAYS INTO EACH NOSTRIL TWICE A DAY, Disp: , Rfl: 10 .  metroNIDAZOLE (FLAGYL) 500 MG tablet, TK 1 T PO TID FOR 10 DAYS, Disp: , Rfl: 0  Current Facility-Administered Medications:  .  triamcinolone acetonide (KENALOG-40) injection 20 mg, 20 mg, Other, Once, Landis Martins, DPM Allergies  Allergen Reactions  . Ceftin Anaphylaxis    Face and throat swell   . Shellfish Allergy Other (See Comments)    Gout exacerbation  . Allopurinol Nausea Only and Other (See Comments)     weakness  . Ativan [Lorazepam] Itching  . Lorazepam Itching  . Sulfa Antibiotics Itching  . Ultram [Tramadol Hcl] Itching     Social History   Socioeconomic History  . Marital status: Married    Spouse name: Not on file  . Number of children: 2  . Years of education: masters  . Highest education level: Not on file  Occupational History  . Not on file  Social Needs  . Financial resource strain: Not on file  . Food insecurity:    Worry: Not on file    Inability: Not on file  . Transportation needs:    Medical: Not on file    Non-medical: Not on file  Tobacco Use  . Smoking status: Former Smoker    Packs/day: 0.10    Years: 26.00    Pack years: 2.60    Types: Cigarettes    Last attempt to quit: 05/06/2007    Years since quitting: 10.9  . Smokeless tobacco: Never Used  Substance and Sexual Activity  . Alcohol use: No  . Drug use: No  . Sexual activity: Yes  Lifestyle  . Physical activity:    Days per week: Not on file    Minutes per session: Not on file  . Stress: Not on file  Relationships  . Social connections:    Talks on phone: Not on file    Gets together: Not on file    Attends religious service: Not on file    Active member of club or organization: Not on file    Attends meetings of clubs or organizations: Not on file    Relationship status: Not on file  . Intimate partner violence:    Fear of current or ex partner: Not on file    Emotionally abused: Not on file    Physically abused: Not on file    Forced sexual activity: Not on file  Other Topics Concern  . Not on file  Social History Narrative   Tobacco Use Cigarettes: Former Smoker, Quit in 2008   No Alcohol   No recreational drug use   Diet: Regular/Low Carb   Exercise: None   Occupation: disabled   Education: Research officer, political party, masters   Children: 2   Firearms: No   Therapist, art Use: Always   Former Metallurgist.        Physical Exam  Pulmonary/Chest: Effort normal. No respiratory distress.  She has no wheezes. She has no rales.  Abdominal: Soft.  Musculoskeletal:  Pt has on una boots  Skin: Skin is warm and dry. She is not diaphoretic.  Future Appointments  Date Time Provider [place]  05/19/2018  7:35 AM [facility] DEVICE REMOTES [facility]  06/09/2018 10:30 AM Philemon Kingdom, MD [facility] None  06/15/2018  1:45 PM Marzetta Board, DPM [facility]  06/30/2018 10:00 AM [facility] ECHO 1-BUZZ [facility]  06/30/2018 11:00 AM Bensimhon, Shaune Pascal, MD [facility] None  07/18/2018  8:05 AM [facility] DEVICE REMOTES [facility]  07/27/2018  8:50 AM [name], Arvin Collard K, DO [facility] None     BP 112/74 (BP Location: Right Arm, Patient Position: Sitting, Cuff Size: Normal)   Pulse 76   Resp 12   Wt 194 lb 3.2 oz (88.1 kg)   SpO2 99%   BMI 37.93 kg/m   Weight yesterday-188.7lb didn't have on clothes yesterday per pt Last visit weight- 195  ATF pt CAO x4 sitting in the living room with no complaints. Pt stated that she took her morning meds. Pt denies sob, chest pain and dizziness.  The RN came by this week to put the [place] boot on. Pt has taken all of her medications without missing any this week.  rx bottles verified and pill box refilled.  Pt stated that she went to her pcp yesterday and was told not to take the antibiotics in her bag.  Antibiotics hasn't been added to her med box for a couple of months (when she had c-diff).   Medication ordered: none  Rhonda [name], EMT Paramedic [phone] 04/28/2018    ACTION: Home visit completed

## 2018-05-02 ENCOUNTER — Telehealth (HOSPITAL_COMMUNITY): Payer: Self-pay

## 2018-05-02 DIAGNOSIS — I429 Cardiomyopathy, unspecified: Secondary | ICD-10-CM | POA: Diagnosis not present

## 2018-05-02 DIAGNOSIS — I110 Hypertensive heart disease with heart failure: Secondary | ICD-10-CM | POA: Diagnosis not present

## 2018-05-02 DIAGNOSIS — E1142 Type 2 diabetes mellitus with diabetic polyneuropathy: Secondary | ICD-10-CM | POA: Diagnosis not present

## 2018-05-02 DIAGNOSIS — I878 Other specified disorders of veins: Secondary | ICD-10-CM | POA: Diagnosis not present

## 2018-05-02 DIAGNOSIS — M1990 Unspecified osteoarthritis, unspecified site: Secondary | ICD-10-CM | POA: Diagnosis not present

## 2018-05-02 DIAGNOSIS — I5022 Chronic systolic (congestive) heart failure: Secondary | ICD-10-CM | POA: Diagnosis not present

## 2018-05-02 DIAGNOSIS — D649 Anemia, unspecified: Secondary | ICD-10-CM | POA: Diagnosis not present

## 2018-05-02 DIAGNOSIS — R296 Repeated falls: Secondary | ICD-10-CM | POA: Diagnosis not present

## 2018-05-02 DIAGNOSIS — I951 Orthostatic hypotension: Secondary | ICD-10-CM | POA: Diagnosis not present

## 2018-05-02 NOTE — Telephone Encounter (Signed)
Pt notified Verbalizes understanding 

## 2018-05-02 NOTE — Telephone Encounter (Signed)
She can take torsemide 60 Mg BID today (3 extra tablets this evening), and can repeat tomorrow if weight still up. BP likely increased in setting of volume overload.  No change.     Legrand Como [address] [place], PA-C 05/02/2018 1:12 PM

## 2018-05-02 NOTE — Telephone Encounter (Signed)
[facility]) called to report that pt had a bp today of 158/102, HR 97, and has gained 5 lbs overnight. Pt is currently taking torsemide 20 mg (Take 3 tablets (60 mg total) by mouth daily). Please advise.

## 2018-05-05 ENCOUNTER — Other Ambulatory Visit (HOSPITAL_COMMUNITY): Payer: Self-pay

## 2018-05-05 NOTE — Progress Notes (Signed)
Paramedicine Encounter    Patient ID: Rhonda [name], female    DOB: Oct 21, 1950, 67 y.o.   MRN: [id number]    Patient Care Team: Maurice Small, MD as PCP - General (Family Medicine) [name], LCSW as Social Worker (Licensed Clinical Social Worker)  Patient Active Problem List   Diagnosis Date Noted  . Orthostatic hypotension 07/28/2017  . SVD (spontaneous vaginal delivery)   . Peripheral neuropathy   . On home oxygen therapy   . Migraines   . Left bundle branch block   . Hypothyroidism   . Hypertension   . Hyperlipidemia   . Heart murmur   . GERD (gastroesophageal reflux disease)   . Exertional shortness of breath   . Depression   . Complication of anesthesia   . Back pain   . Arthritis   . Anxiety   . Anemia   . HTN (hypertension) 09/14/2013  . Chronic respiratory failure ([place]) 09/14/2013  . 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 Morbid obesity ([place]) 11/01/2012  . Chronic systolic heart failure ([place]) 10/27/2012  . S/P right TKA 05/10/2012  . Endometrial polyp 01/20/2012  . Breast cancer, stage 1 ([place]) 03/26/2011  . Unspecified vitamin D deficiency 03/26/2011    Current Outpatient Medications:  .  acetaZOLAMIDE (DIAMOX) 125 MG tablet, TAKE 1 TABLET(125 MG) BY MOUTH DAILY, Disp: 30 tablet, Rfl: 5 .  buPROPion (WELLBUTRIN XL) 150 MG 24 hr tablet, Take 450 mg by mouth every morning. , Disp: , Rfl:  .  citalopram (CELEXA) 10 MG tablet, Take 10 mg by mouth daily. , Disp: , Rfl:  .  clonazePAM (KLONOPIN) 0.5 MG tablet, Take 1 mg by mouth daily. , Disp: , Rfl:  .  fexofenadine (ALLEGRA) 180 MG tablet, Take 180 mg by mouth 2 (two) times daily., Disp: , Rfl:  .  fluticasone (FLONASE) 50 MCG/ACT nasal spray, INSTILL 2 SPRAYS INTO EACH NOSTRIL ONCE DAILY, Disp: , Rfl: 7 .  gabapentin (NEURONTIN) 300 MG capsule, Take 300 mg by mouth 3 (three) times daily. , Disp: , Rfl:  .  levothyroxine (SYNTHROID, LEVOTHROID) 100 MCG tablet, Take 1  tablet (100 mcg total) by mouth daily., Disp: 45 tablet, Rfl: 3 .  midodrine (PROAMATINE) 10 MG tablet, Take 1 tablet (10 mg total) by mouth 3 (three) times daily with meals., Disp: 90 tablet, Rfl: 3 .  montelukast (SINGULAIR) 10 MG tablet, Take 10 mg by mouth at bedtime. , Disp: , Rfl: 1 .  potassium chloride SA (K-DUR,KLOR-CON) 20 MEQ tablet, Take 3 tablets (60 mEq total) by mouth 2 (two) times daily., Disp: 180 tablet, Rfl: 11 .  simvastatin (ZOCOR) 10 MG tablet, Take 10 mg by mouth every evening. , Disp: , Rfl:  .  spironolactone (ALDACTONE) 25 MG tablet, Take 0.5 tablets (12.5 mg total) by mouth daily., Disp: 45 tablet, Rfl: 3 .  torsemide (DEMADEX) 20 MG tablet, Take 3 tablets (60 mg total) by mouth daily., Disp: 180 tablet, Rfl: 3 .  zolpidem (AMBIEN) 10 MG tablet, Take 10 mg by mouth at bedtime as needed. For sleep, Disp: , Rfl:  .  ALPRAZolam (XANAX) 0.25 MG tablet, , Disp: , Rfl: 0 .  ARIPiprazole (ABILIFY) 2 MG tablet, Take 2 mg by mouth every morning. , Disp: , Rfl:  .  BISACODYL 5 MG EC tablet, TK UTD, Disp: , Rfl: 0 .  clindamycin (CLEOCIN) 300 MG capsule, Take 600 mg by mouth as needed (1 hour prior to dental work)., Disp: ,  Rfl:  .  febuxostat (ULORIC) 40 MG tablet, Take 40 mg by mouth at bedtime. , Disp: , Rfl:  .  gabapentin (NEURONTIN) 800 MG tablet, Take 800 mg by mouth as needed., Disp: , Rfl:  .  ipratropium (ATROVENT) 0.03 % nasal spray, INSTILL 2 SPRAYS INTO EACH NOSTRIL TWICE A DAY, Disp: , Rfl: 10 .  metolazone (ZAROXOLYN) 2.5 MG tablet, take 1 tablet by mouth if needed for FLUID OR EDEMA, Disp: 30 tablet, Rfl: 2 .  metroNIDAZOLE (FLAGYL) 500 MG tablet, TK 1 T PO TID FOR 10 DAYS, Disp: , Rfl: 0  Current Facility-Administered Medications:  .  triamcinolone acetonide (KENALOG-40) injection 20 mg, 20 mg, Other, Once, Landis Martins, DPM Allergies  Allergen Reactions  . Ceftin Anaphylaxis    Face and throat swell   . Shellfish Allergy Other (See Comments)    Gout  exacerbation  . Allopurinol Nausea Only and Other (See Comments)    weakness  . Ativan [Lorazepam] Itching  . Lorazepam Itching  . Sulfa Antibiotics Itching  . Ultram [Tramadol Hcl] Itching     Social History   Socioeconomic History  . Marital status: Married    Spouse name: Not on file  . Number of children: 2  . Years of education: masters  . Highest education level: Not on file  Occupational History  . Not on file  Social Needs  . Financial resource strain: Not on file  . Food insecurity:    Worry: Not on file    Inability: Not on file  . Transportation needs:    Medical: Not on file    Non-medical: Not on file  Tobacco Use  . Smoking status: Former Smoker    Packs/day: 0.10    Years: 26.00    Pack years: 2.60    Types: Cigarettes    Last attempt to quit: 05/06/2007    Years since quitting: 11.0  . Smokeless tobacco: Never Used  Substance and Sexual Activity  . Alcohol use: No  . Drug use: No  . Sexual activity: Yes  Lifestyle  . Physical activity:    Days per week: Not on file    Minutes per session: Not on file  . Stress: Not on file  Relationships  . Social connections:    Talks on phone: Not on file    Gets together: Not on file    Attends religious service: Not on file    Active member of club or organization: Not on file    Attends meetings of clubs or organizations: Not on file    Relationship status: Not on file  . Intimate partner violence:    Fear of current or ex partner: Not on file    Emotionally abused: Not on file    Physically abused: Not on file    Forced sexual activity: Not on file  Other Topics Concern  . Not on file  Social History Narrative   Tobacco Use Cigarettes: Former Smoker, Quit in 2008   No Alcohol   No recreational drug use   Diet: Regular/Low Carb   Exercise: None   Occupation: disabled   Education: Research officer, political party, masters   Children: 2   Firearms: No   Therapist, art Use: Always   Former Metallurgist.         Physical Exam Pulmonary:     Effort: No respiratory distress.     Breath sounds: No wheezing or rales.         Future Appointments  Date Time Provider [place]  05/19/2018  7:35 AM [facility] DEVICE REMOTES [facility]  06/09/2018 10:30 AM Philemon Kingdom, MD [facility] None  06/15/2018  1:45 PM Marzetta Board, DPM [facility]  06/30/2018 10:00 AM [facility] ECHO 1-BUZZ [facility]  06/30/2018 11:00 AM Bensimhon, Shaune Pascal, MD [facility] None  07/18/2018  8:05 AM [facility] DEVICE REMOTES [facility]  07/27/2018  8:50 AM Patel, Arvin Collard K, DO [facility] None     BP (!) 142/84 (BP Location: Right Arm, Patient Position: Sitting, Cuff Size: Large)   Pulse 92   Wt 192 lb (87.1 kg)   SpO2 99%   BMI 37.50 kg/m   Weight yesterday-191 Last visit weight-194  ATF pt CAO x4 sitting in the kitchen with no complaints. Pt stated that she feels good today. She denies sob, chest pain and dizziness. She stated that she took her meds this morning and she hasn't missed a dose of meds within this past week. Pt stated that she is trying to remain active throughout the day. She stated that the RN is no longer put the unna boots on after this week. She was told to wear compression stalking/socks.  rx bottles verified and her pill box refilled.    Medication ordered: pt stated that she will call them in Acetazolamide fexofendadine (over the counter)  Jenesa Foresta, EMT Paramedic [phone] 05/06/2018    ACTION: Home visit completed

## 2018-05-12 ENCOUNTER — Other Ambulatory Visit (HOSPITAL_COMMUNITY): Payer: Self-pay

## 2018-05-12 DIAGNOSIS — R296 Repeated falls: Secondary | ICD-10-CM | POA: Diagnosis not present

## 2018-05-12 DIAGNOSIS — M1990 Unspecified osteoarthritis, unspecified site: Secondary | ICD-10-CM | POA: Diagnosis not present

## 2018-05-12 DIAGNOSIS — E1142 Type 2 diabetes mellitus with diabetic polyneuropathy: Secondary | ICD-10-CM | POA: Diagnosis not present

## 2018-05-12 DIAGNOSIS — I429 Cardiomyopathy, unspecified: Secondary | ICD-10-CM | POA: Diagnosis not present

## 2018-05-12 DIAGNOSIS — I878 Other specified disorders of veins: Secondary | ICD-10-CM | POA: Diagnosis not present

## 2018-05-12 DIAGNOSIS — I951 Orthostatic hypotension: Secondary | ICD-10-CM | POA: Diagnosis not present

## 2018-05-12 DIAGNOSIS — D649 Anemia, unspecified: Secondary | ICD-10-CM | POA: Diagnosis not present

## 2018-05-12 DIAGNOSIS — I5022 Chronic systolic (congestive) heart failure: Secondary | ICD-10-CM | POA: Diagnosis not present

## 2018-05-12 DIAGNOSIS — I110 Hypertensive heart disease with heart failure: Secondary | ICD-10-CM | POA: Diagnosis not present

## 2018-05-13 ENCOUNTER — Encounter (HOSPITAL_COMMUNITY): Payer: Self-pay

## 2018-05-13 ENCOUNTER — Other Ambulatory Visit: Payer: Self-pay | Admitting: Internal Medicine

## 2018-05-13 ENCOUNTER — Telehealth (HOSPITAL_COMMUNITY): Payer: Self-pay

## 2018-05-13 NOTE — Progress Notes (Signed)
Paramedicine Encounter    Patient ID: Rhonda [name], female    DOB: [birth year], 67 y.o.   MRN: [id number]    Patient Care Team: Maurice Small, MD as PCP - General (Family Medicine) [name], LCSW as Social Worker (Licensed Clinical Social Worker)  Patient Active Problem List   Diagnosis Date Noted  . Orthostatic hypotension 07/28/2017  . SVD (spontaneous vaginal delivery)   . Peripheral neuropathy   . On home oxygen therapy   . Migraines   . Left bundle branch block   . Hypothyroidism   . Hypertension   . Hyperlipidemia   . Heart murmur   . GERD (gastroesophageal reflux disease)   . Exertional shortness of breath   . Depression   . Complication of anesthesia   . Back pain   . Arthritis   . Anxiety   . Anemia   . HTN (hypertension) 09/14/2013  . Chronic respiratory failure ([place]) 09/14/2013  . 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 Morbid obesity ([place]) 11/01/2012  . Chronic systolic heart failure ([place]) 10/27/2012  . S/P right TKA 05/10/2012  . Endometrial polyp 01/20/2012  . Breast cancer, stage 1 ([place]) 03/26/2011  . Unspecified vitamin D deficiency 03/26/2011    Current Outpatient Medications:  .  acetaZOLAMIDE (DIAMOX) 125 MG tablet, TAKE 1 TABLET(125 MG) BY MOUTH DAILY, Disp: 30 tablet, Rfl: 5 .  ALPRAZolam (XANAX) 0.25 MG tablet, , Disp: , Rfl: 0 .  ARIPiprazole (ABILIFY) 2 MG tablet, Take 2 mg by mouth every morning. , Disp: , Rfl:  .  BISACODYL 5 MG EC tablet, TK UTD, Disp: , Rfl: 0 .  buPROPion (WELLBUTRIN XL) 150 MG 24 hr tablet, Take 450 mg by mouth every morning. , Disp: , Rfl:  .  citalopram (CELEXA) 10 MG tablet, Take 10 mg by mouth daily. , Disp: , Rfl:  .  clindamycin (CLEOCIN) 300 MG capsule, Take 600 mg by mouth as needed (1 hour prior to dental work)., Disp: , Rfl:  .  clonazePAM (KLONOPIN) 0.5 MG tablet, Take 1 mg by mouth daily. , Disp: , Rfl:  .  febuxostat (ULORIC) 40 MG tablet, Take 40 mg by mouth at  bedtime. , Disp: , Rfl:  .  fexofenadine (ALLEGRA) 180 MG tablet, Take 180 mg by mouth 2 (two) times daily., Disp: , Rfl:  .  fluticasone (FLONASE) 50 MCG/ACT nasal spray, INSTILL 2 SPRAYS INTO EACH NOSTRIL ONCE DAILY, Disp: , Rfl: 7 .  gabapentin (NEURONTIN) 300 MG capsule, Take 300 mg by mouth 3 (three) times daily. , Disp: , Rfl:  .  gabapentin (NEURONTIN) 800 MG tablet, Take 800 mg by mouth as needed., Disp: , Rfl:  .  ipratropium (ATROVENT) 0.03 % nasal spray, INSTILL 2 SPRAYS INTO EACH NOSTRIL TWICE A DAY, Disp: , Rfl: 10 .  levothyroxine (SYNTHROID, LEVOTHROID) 100 MCG tablet, Take 1 tablet (100 mcg total) by mouth daily., Disp: 45 tablet, Rfl: 3 .  metolazone (ZAROXOLYN) 2.5 MG tablet, take 1 tablet by mouth if needed for FLUID OR EDEMA, Disp: 30 tablet, Rfl: 2 .  metroNIDAZOLE (FLAGYL) 500 MG tablet, TK 1 T PO TID FOR 10 DAYS, Disp: , Rfl: 0 .  midodrine (PROAMATINE) 10 MG tablet, Take 1 tablet (10 mg total) by mouth 3 (three) times daily with meals., Disp: 90 tablet, Rfl: 3 .  montelukast (SINGULAIR) 10 MG tablet, Take 10 mg by mouth at bedtime. , Disp: , Rfl: 1 .  potassium chloride SA (K-DUR,KLOR-CON) 20  MEQ tablet, Take 3 tablets (60 mEq total) by mouth 2 (two) times daily., Disp: 180 tablet, Rfl: 11 .  simvastatin (ZOCOR) 10 MG tablet, Take 10 mg by mouth every evening. , Disp: , Rfl:  .  spironolactone (ALDACTONE) 25 MG tablet, Take 0.5 tablets (12.5 mg total) by mouth daily., Disp: 45 tablet, Rfl: 3 .  torsemide (DEMADEX) 20 MG tablet, Take 3 tablets (60 mg total) by mouth daily., Disp: 180 tablet, Rfl: 3 .  zolpidem (AMBIEN) 10 MG tablet, Take 10 mg by mouth at bedtime as needed. For sleep, Disp: , Rfl:   Current Facility-Administered Medications:  .  triamcinolone acetonide (KENALOG-40) injection 20 mg, 20 mg, Other, Once, Landis Martins, DPM Allergies  Allergen Reactions  . Ceftin Anaphylaxis    Face and throat swell   . Shellfish Allergy Other (See Comments)    Gout  exacerbation  . Allopurinol Nausea Only and Other (See Comments)    weakness  . Ativan [Lorazepam] Itching  . Lorazepam Itching  . Sulfa Antibiotics Itching  . Ultram [Tramadol Hcl] Itching     Social History   Socioeconomic History  . Marital status: Married    Spouse name: Not on file  . Number of children: 2  . Years of education: masters  . Highest education level: Not on file  Occupational History  . Not on file  Social Needs  . Financial resource strain: Not on file  . Food insecurity:    Worry: Not on file    Inability: Not on file  . Transportation needs:    Medical: Not on file    Non-medical: Not on file  Tobacco Use  . Smoking status: Former Smoker    Packs/day: 0.10    Years: 26.00    Pack years: 2.60    Types: Cigarettes    Last attempt to quit: 05/06/2007    Years since quitting: 11.0  . Smokeless tobacco: Never Used  Substance and Sexual Activity  . Alcohol use: No  . Drug use: No  . Sexual activity: Yes  Lifestyle  . Physical activity:    Days per week: Not on file    Minutes per session: Not on file  . Stress: Not on file  Relationships  . Social connections:    Talks on phone: Not on file    Gets together: Not on file    Attends religious service: Not on file    Active member of club or organization: Not on file    Attends meetings of clubs or organizations: Not on file    Relationship status: Not on file  . Intimate partner violence:    Fear of current or ex partner: Not on file    Emotionally abused: Not on file    Physically abused: Not on file    Forced sexual activity: Not on file  Other Topics Concern  . Not on file  Social History Narrative   Tobacco Use Cigarettes: Former Smoker, Quit in 2008   No Alcohol   No recreational drug use   Diet: Regular/Low Carb   Exercise: None   Occupation: disabled   Education: Research officer, political party, masters   Children: 2   Firearms: No   Therapist, art Use: Always   Former Metallurgist.         Physical Exam Pulmonary:     Effort: Pulmonary effort is normal. No respiratory distress.     Breath sounds: No wheezing.  Abdominal:     General: There is  no distension.  Skin:    General: Skin is warm and dry.         Future Appointments  Date Time Provider [place]  05/19/2018  7:35 AM [facility] DEVICE REMOTES [facility]  06/09/2018 10:30 AM Philemon Kingdom, MD [facility] None  06/15/2018  1:45 PM Marzetta Board, DPM [facility]  06/30/2018 10:00 AM MC ECHO 1-BUZZ [facility]  06/30/2018 11:00 AM Bensimhon, Shaune Pascal, MD [facility] None  07/18/2018  8:05 AM [facility] DEVICE REMOTES [facility]  07/27/2018  8:50 AM Patel, Arvin Collard K, DO [facility] None     Pulse 98   Resp 12   Wt 192 lb 3.2 oz (87.2 kg)   SpO2 98%   BMI 37.54 kg/m   Weight yesterday-190 Last visit weight-194  ATF pt CAO x4 sitting in her living room with no complaints. Pt has taken all of her meds for the week.  She denies sob, chest pain and dizziness. She's now wearing pressure stockings, which she stated "maybe working". She stated that she's been drinking a lot of giner ale lately, so her "weight may be up". I filled two pill boxes because I will be on vacation next week. The second pill box (taped/sealed) placed on the shelf in the dinning room area. Pt wrote a reminder in her book to the location of the box, due to her ongoing issues with confusion.     Medication ordered: none  Rhonda [name], EMT Paramedic [phone] 05/13/2018    ACTION: Home visit completed

## 2018-05-13 NOTE — Telephone Encounter (Signed)
Pt called to confirm today's appointment/ no answer.

## 2018-05-19 ENCOUNTER — Ambulatory Visit (INDEPENDENT_AMBULATORY_CARE_PROVIDER_SITE_OTHER): Payer: Medicare HMO

## 2018-05-19 DIAGNOSIS — I5032 Chronic diastolic (congestive) heart failure: Secondary | ICD-10-CM

## 2018-05-19 DIAGNOSIS — Z95810 Presence of automatic (implantable) cardiac defibrillator: Secondary | ICD-10-CM

## 2018-05-20 ENCOUNTER — Telehealth: Payer: Self-pay

## 2018-05-20 DIAGNOSIS — I5022 Chronic systolic (congestive) heart failure: Secondary | ICD-10-CM | POA: Diagnosis not present

## 2018-05-20 DIAGNOSIS — I951 Orthostatic hypotension: Secondary | ICD-10-CM | POA: Diagnosis not present

## 2018-05-20 DIAGNOSIS — D649 Anemia, unspecified: Secondary | ICD-10-CM | POA: Diagnosis not present

## 2018-05-20 DIAGNOSIS — M1990 Unspecified osteoarthritis, unspecified site: Secondary | ICD-10-CM | POA: Diagnosis not present

## 2018-05-20 DIAGNOSIS — I110 Hypertensive heart disease with heart failure: Secondary | ICD-10-CM | POA: Diagnosis not present

## 2018-05-20 DIAGNOSIS — I878 Other specified disorders of veins: Secondary | ICD-10-CM | POA: Diagnosis not present

## 2018-05-20 DIAGNOSIS — R296 Repeated falls: Secondary | ICD-10-CM | POA: Diagnosis not present

## 2018-05-20 DIAGNOSIS — E1142 Type 2 diabetes mellitus with diabetic polyneuropathy: Secondary | ICD-10-CM | POA: Diagnosis not present

## 2018-05-20 DIAGNOSIS — I429 Cardiomyopathy, unspecified: Secondary | ICD-10-CM | POA: Diagnosis not present

## 2018-05-20 NOTE — Progress Notes (Signed)
EPIC Encounter for ICM Monitoring  Patient Name: Rhonda [name] is a 67 y.o. female Date: 05/20/2018 Primary Care Physican: Maurice Small, MD Primary Cardiologist:Bensimhon Electrophysiologist: Santina Evans Pacing: 99.9% LastWeight:190lbs  Today's Weight:189.4lbs                                       Heart Failure questions reviewed. Weight has fluctuated to 192 lbs this week but currently weighing 189.4.  She said legs are slightly swollen but is wearing compression hose.  She reported having several falls in the last couple of weeks.   She is out of breath when she walks the length of [employer] and gets SOB when trying to buy groceries.    Thoracic impedance continues to be abnormal suggesting fluid accumulation.  Prescribed: Torsemide 20 mg3tablets (60 mg total)once aday.Per11/6/2019HFoffice note,take an extra 60 mg PRN.Potassium 20 meq 3tablets (60 mEq total)twice a day. Metolazone 2.75m 1 tablet by mouth if needed for fluid or edema.  Labs: 03/30/2018 Creatinine 1.63, BUN 26, Potassium 3.9, Sodium 139, eGFR 32-37 02/10/2018 Creatinine 1.85, BUN 41, Potassium 3.9, Sodium 138, EGFR 28-33 10/29/2017 Creatinine 1.45, BUN 24, Potassium 3.9, Sodium 141, EGFR 37-43 10/21/2017 Creatinine 1.57, BUN 19, Potassium 3.8, Sodium 140, EGFR 34-39 09/23/2017 Creatinine 1.26, BUN 29, Potassium 3.2, Sodium 142, EGFR 49-56 09/09/2017 Creatinine 1.22, BUN 20, Potassium 3.4, Sodium 140, EGFR 46-53 08/25/2017 Creatinine 1.52, BUN 47, Potassium 3.1, Sodium 137, EGFR 35-41 07/28/2017 Creatinine 1.69, BUN 53, Potassium 3.0, Sodium 139, EGFR 31-36 (low potassium addressed by HF clinic) 06/21/2017 Creatinine 2.06, BUN 38, Potassium 2.5, Sodium 136, EGFR 24-28  Recommendations: ICM Phone note sent for physician recommendations.  Follow-up plan: ICM clinic phone appointment on 05/26/2018 to recheck fluid levels.     Copy of ICM check sent to Dr. TLovena Leand Dr BHaroldine Laws   3 month ICM  trend: 05/20/2018    1 Year ICM trend:       [name] RN 05/20/2018 9:17 AM

## 2018-05-20 NOTE — Telephone Encounter (Signed)
Spoke with patient for monthly ICM follow up.  Advised of remote transmission results.    SYMPTOMS: Chronic leg swelling but no worse in the last 2 weeks.  Weight fluctuation up to 192 lbs this week but today was 189.4.  She is out of breath when she walks the length of [employer] and gets SOB when trying to buy groceries.   ICM REPORT: See ICM chart note for details  OPTIVOL RESULTS: Suggesting fluid accumulation for month of December  PRESCRIBED: Torsemide 20 mg3tablets (60 mg total)once aday.Potassium 20 meq 3tablets (60 mEq total)twice a day. Metolazone 2.5mg  1 tablet by mouth if needed for fluid or edema.  RECOMMENDATIONS: Advised patient would send information for Dr Bensimhon/HF clinic for review.    Remote transmission scheduled for reassessment on 05/26/2018.  Please advise if medication can be adjusted or if other recommendations are needed.

## 2018-05-20 NOTE — Progress Notes (Signed)
Spoke with patient (see phone note). Advised Dr Haroldine Laws recommended to take Torsemide 60 mg in the AM and 60 mg in PM x 2 days only.  If she does not improve, then take 1 tablet of Metolazone 30 minutes prior to taking Torsemide 60 mg morning dosage with additional 2 Potassium tablets to equal 40 mEq.  She will not take an evening dose of Torsemide the day she takes the Metolazone and then resume her prescribed dosages of Potassium and Torsemide. She verbalized understanding and wrote down instructions.  Advised to call back if she has any questions.

## 2018-05-20 NOTE — Telephone Encounter (Signed)
Double torsemide for 2 days and see if improves. If not try one dose metoalzone with kcl 40  thanks

## 2018-05-20 NOTE — Telephone Encounter (Signed)
Spoke with patient. Advised Dr Haroldine Laws recommended to take Torsemide 60 mg in the AM and 60 mg in PM x 2 days only.  If she does not improve, then take 1 tablet of Metolazone 30 minutes prior to taking Torsemide 60 mg morning dosage with additional 2 Potassium tablets to equal 40 mEq.  She will not take an evening dose of Torsemide the day she takes the Metolazone and then resume her prescribed dosages of Potassium and Torsemide. She verbalized understanding and wrote down instructions.  Advised to call back if she has any questions.

## 2018-05-23 ENCOUNTER — Other Ambulatory Visit: Payer: Self-pay | Admitting: Gastroenterology

## 2018-05-23 DIAGNOSIS — R109 Unspecified abdominal pain: Secondary | ICD-10-CM

## 2018-05-23 DIAGNOSIS — R197 Diarrhea, unspecified: Secondary | ICD-10-CM

## 2018-05-23 DIAGNOSIS — Z8619 Personal history of other infectious and parasitic diseases: Secondary | ICD-10-CM

## 2018-05-23 NOTE — Telephone Encounter (Signed)
Attempted call to patient to check how she is feeling after taking extra Torsemide as advised.  Requested a call back.

## 2018-05-23 NOTE — Telephone Encounter (Signed)
Spoke with patient.  She stated the extra Torsemide did not help and weight increased from 189 back to 192 lbs today.  She plans on taking Metolazone and extra Potassium tomorrow and reminded her to take Metolazone with 2 extra Potassium tablets 30 minutes prior to taking morning Torsemide dose tomorrow.  Advised she should be back to taking Torsemide 60 mg every morning daily and she confirmed she is.

## 2018-05-26 ENCOUNTER — Ambulatory Visit (INDEPENDENT_AMBULATORY_CARE_PROVIDER_SITE_OTHER): Payer: Medicare HMO

## 2018-05-26 ENCOUNTER — Telehealth: Payer: Self-pay

## 2018-05-26 DIAGNOSIS — Z95810 Presence of automatic (implantable) cardiac defibrillator: Secondary | ICD-10-CM

## 2018-05-26 DIAGNOSIS — I5032 Chronic diastolic (congestive) heart failure: Secondary | ICD-10-CM

## 2018-05-26 NOTE — Progress Notes (Signed)
EPIC Encounter for ICM Monitoring  Patient Name: Rhonda [name] is a 68 y.o. female Date: 05/26/2018 Primary Care Physican: Maurice Small, MD Primary Cardiologist:Bensimhon Electrophysiologist: Santina Evans Pacing: 99.9% LastWeight:192lbs  Today's Weight:  184 lbs   Spoke with patient to recheck fluid levels from 05/19/2018.     Symptoms:  She reported weight loss of 8 lbs since taking metolazone with potassium 05/24/2018.  Denies any fluid symptoms  No change in thoracic impedance after taking extra Torsemide 12/27 & 12/28 and 1 2.5 mg tablet of Metolazone with additional 40 mEq Potassium on 05/24/2018   Prescribed: Torsemide 20 mg3tablets (60 mg total)once aday.Per11/6/2019HFoffice note,take an extra 60 mg PRN.Potassium 20 meq 3tablets (60 mEq total)twice a day. Metolazone 2.52m 1 tablet by mouth if needed for fluid or edema.  Labs: 03/30/2018 Creatinine 1.63, BUN 26, Potassium 3.9, Sodium 139, eGFR 32-37 02/10/2018 Creatinine 1.85, BUN 41, Potassium 3.9, Sodium 138, EGFR 28-33 10/29/2017 Creatinine 1.45, BUN 24, Potassium 3.9, Sodium 141, EGFR 37-43 10/21/2017 Creatinine 1.57, BUN 19, Potassium 3.8, Sodium 140, EGFR 34-39 09/23/2017 Creatinine 1.26, BUN 29, Potassium 3.2, Sodium 142, EGFR 49-56 09/09/2017 Creatinine 1.22, BUN 20, Potassium 3.4, Sodium 140, EGFR 46-53 08/25/2017 Creatinine 1.52, BUN 47, Potassium 3.1, Sodium 137, EGFR 35-41 07/28/2017 Creatinine 1.69, BUN 53, Potassium 3.0, Sodium 139, EGFR 31-36 (low potassium addressed by HF clinic) 06/21/2017 Creatinine 2.06, BUN 38, Potassium 2.5, Sodium 136, EGFR 24-28  Recommendations:  Advised will call back if any recommendations given by physician.  Phone note sent to Dr Dr BHaroldine Laws      Follow up plan: ICM clinic phone appointment on 05/30/2018  to recheck fluid levels.  Office appointment scheduled 06/30/2018 with Dr. BHaroldine Laws     Copy of ICM check sent to Dr. TLovena Leand Dr BHaroldine Lawsfor review and  recommendations if needed.  3 month ICM trend: 05/26/2017    1 Year ICM trend:     [name] RN 05/26/2018 12:21 PM

## 2018-05-26 NOTE — Telephone Encounter (Signed)
Remote ICM transmission received.  Attempted call to patient regarding ICM remote transmission fluid recheck and left message, per DPR, to return call.

## 2018-05-26 NOTE — Telephone Encounter (Signed)
Spoke with patient to recheck fluid levels from 05/19/2018.     Symptoms:  She reported 8 lb weight loss after taking metolazone with potassium 05/24/2018.   Denies any fluid symptoms  No change in thoracic impedance after taking extra Torsemide 12/27 & 12/28 and 1 2.5 mg tablet of Metolazone with additional 40 mEq Potassium on 05/24/2018   Prescribed: Torsemide 20 mg3tablets (60 mg total)once aday.Per11/6/2019HFoffice note,take an extra 60 mg PRN.Potassium 20 meq 3tablets (60 mEq total)twice a day. Metolazone 2.50m 1 tablet by mouth if needed for fluid or edema.  Labs: 03/30/2018 Creatinine 1.63, BUN 26, Potassium 3.9, Sodium 139, eGFR 32-37 02/10/2018 Creatinine 1.85, BUN 41, Potassium 3.9, Sodium 138, EGFR 28-33 10/29/2017 Creatinine 1.45, BUN 24, Potassium 3.9, Sodium 141, EGFR 37-43 10/21/2017 Creatinine 1.57, BUN 19, Potassium 3.8, Sodium 140, EGFR 34-39 09/23/2017 Creatinine 1.26, BUN 29, Potassium 3.2, Sodium 142, EGFR 49-56 09/09/2017 Creatinine 1.22, BUN 20, Potassium 3.4, Sodium 140, EGFR 46-53 08/25/2017 Creatinine 1.52, BUN 47, Potassium 3.1, Sodium 137, EGFR 35-41 07/28/2017 Creatinine 1.69, BUN 53, Potassium 3.0, Sodium 139, EGFR 31-36 (low potassium addressed by HF clinic) 06/21/2017 Creatinine 2.06, BUN 38, Potassium 2.5, Sodium 136, EGFR 24-28  Recommendations:  Advised will call back if any recommendations given by physician.  Phone note sent to Dr Dr BHaroldine Laws      Next office appointment with Dr BHaroldine Lawsis scheduled 06/30/2018           Please advise if any recommendations are needed.

## 2018-05-27 ENCOUNTER — Telehealth (HOSPITAL_COMMUNITY): Payer: Self-pay

## 2018-05-27 ENCOUNTER — Other Ambulatory Visit (HOSPITAL_COMMUNITY): Payer: Self-pay

## 2018-05-27 DIAGNOSIS — Z86010 Personal history of colonic polyps: Secondary | ICD-10-CM | POA: Diagnosis not present

## 2018-05-27 DIAGNOSIS — N183 Chronic kidney disease, stage 3 (moderate): Secondary | ICD-10-CM | POA: Diagnosis not present

## 2018-05-27 DIAGNOSIS — R197 Diarrhea, unspecified: Secondary | ICD-10-CM | POA: Diagnosis not present

## 2018-05-27 DIAGNOSIS — Z800 Family history of malignant neoplasm of digestive organs: Secondary | ICD-10-CM | POA: Diagnosis not present

## 2018-05-27 DIAGNOSIS — Z8619 Personal history of other infectious and parasitic diseases: Secondary | ICD-10-CM | POA: Diagnosis not present

## 2018-05-27 DIAGNOSIS — I5022 Chronic systolic (congestive) heart failure: Secondary | ICD-10-CM | POA: Diagnosis not present

## 2018-05-27 NOTE — Telephone Encounter (Signed)
Pt called and stated that she is home from the doctors office and she will be  Available for [facility] visit later today.  Pt agrees to meet @ 3pm

## 2018-05-27 NOTE — Progress Notes (Signed)
Paramedicine Encounter    Patient ID: Rhonda [name], female    DOB: November 01, 1950, 68 y.o.   MRN: [id number]    Patient Care Team: Maurice Small, MD as PCP - General (Family Medicine) [name], LCSW as Social Worker (Licensed Clinical Social Worker)  Patient Active Problem List   Diagnosis Date Noted  . Orthostatic hypotension 07/28/2017  . SVD (spontaneous vaginal delivery)   . Peripheral neuropathy   . On home oxygen therapy   . Migraines   . Left bundle branch block   . Hypothyroidism   . Hypertension   . Hyperlipidemia   . Heart murmur   . GERD (gastroesophageal reflux disease)   . Exertional shortness of breath   . Depression   . Complication of anesthesia   . Back pain   . Arthritis   . Anxiety   . Anemia   . HTN (hypertension) 09/14/2013  . Chronic respiratory failure ([place]) 09/14/2013  . 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 Morbid obesity ([place]) 11/01/2012  . Chronic systolic heart failure ([place]) 10/27/2012  . S/P right TKA 05/10/2012  . Endometrial polyp 01/20/2012  . Breast cancer, stage 1 ([place]) 03/26/2011  . Unspecified vitamin D deficiency 03/26/2011    Current Outpatient Medications:  .  acetaminophen (TYLENOL) 500 MG tablet, Take 1,000-1,500 mg by mouth every 6 (six) hours as needed (for pain.)., Disp: , Rfl:  .  acetaZOLAMIDE (DIAMOX) 125 MG tablet, TAKE 1 TABLET(125 MG) BY MOUTH DAILY (Patient taking differently: Take 125 mg by mouth daily. ), Disp: 30 tablet, Rfl: 5 .  buPROPion (WELLBUTRIN XL) 150 MG 24 hr tablet, Take 450 mg by mouth every morning. , Disp: , Rfl:  .  carboxymethylcellulose (REFRESH PLUS) 0.5 % SOLN, Place 1 drop into both eyes 3 (three) times daily as needed (dry eyes)., Disp: , Rfl:  .  carvedilol (COREG) 3.125 MG tablet, Take 3.125 mg by mouth 2 (two) times daily with a meal., Disp: , Rfl:  .  clonazePAM (KLONOPIN) 0.5 MG tablet, Take 0.5 mg by mouth 3 (three) times daily as needed for  anxiety. , Disp: , Rfl:  .  colestipol (COLESTID) 1 g tablet, Take 2 g by mouth 2 (two) times daily. , Disp: , Rfl:  .  fluticasone (FLONASE) 50 MCG/ACT nasal spray, Place 2 sprays into both nostrils daily as needed for allergies. , Disp: , Rfl: 7 .  gabapentin (NEURONTIN) 300 MG capsule, Take 300 mg by mouth 3 (three) times daily. , Disp: , Rfl:  .  gabapentin (NEURONTIN) 800 MG tablet, Take 800 mg by mouth at bedtime as needed (neuropathy). , Disp: , Rfl:  .  ipratropium (ATROVENT) 0.03 % nasal spray, Place 2 sprays into both nostrils every 12 (twelve) hours as needed for rhinitis., Disp: , Rfl:  .  levothyroxine (SYNTHROID, LEVOTHROID) 100 MCG tablet, Take 1 tablet (100 mcg total) by mouth daily., Disp: 45 tablet, Rfl: 3 .  midodrine (PROAMATINE) 5 MG tablet, Take 5 mg by mouth 3 (three) times daily with meals., Disp: , Rfl:  .  montelukast (SINGULAIR) 10 MG tablet, Take 10 mg by mouth at bedtime. , Disp: , Rfl: 1 .  Multiple Vitamin (MULTIVITAMIN WITH MINERALS) TABS tablet, Take 1 tablet by mouth daily. One-A-Day 50+, Disp: , Rfl:  .  polyethylene glycol (MIRALAX / GLYCOLAX) packet, Take 17 g by mouth daily as needed for moderate constipation., Disp: , Rfl:  .  potassium chloride SA (K-DUR,KLOR-CON) 20 MEQ tablet, Take  3 tablets (60 mEq total) by mouth 2 (two) times daily., Disp: 180 tablet, Rfl: 11 .  simvastatin (ZOCOR) 10 MG tablet, Take 10 mg by mouth every evening. , Disp: , Rfl:  .  spironolactone (ALDACTONE) 25 MG tablet, Take 0.5 tablets (12.5 mg total) by mouth daily. (Patient taking differently: Take 12.5 mg by mouth every evening. ), Disp: 45 tablet, Rfl: 3 .  torsemide (DEMADEX) 20 MG tablet, Take 3 tablets (60 mg total) by mouth daily. (Patient taking differently: Take 60 mg by mouth 2 (two) times daily. ), Disp: 180 tablet, Rfl: 3 .  zolpidem (AMBIEN) 10 MG tablet, Take 10 mg by mouth at bedtime as needed for sleep. For sleep , Disp: , Rfl:  .  clindamycin (CLEOCIN) 300 MG capsule,  Take 600 mg by mouth See admin instructions. Take 2 capsules (600 mg) by mouth 1 hour prior to dental procedure., Disp: , Rfl:  .  metolazone (ZAROXOLYN) 2.5 MG tablet, take 1 tablet by mouth if needed for FLUID OR EDEMA (Patient taking differently: Take 2.5 mg by mouth daily as needed (fluid retention/edema (weight gain of greater than 6 lbs)). ), Disp: 30 tablet, Rfl: 2 .  midodrine (PROAMATINE) 10 MG tablet, Take 1 tablet (10 mg total) by mouth 3 (three) times daily with meals., Disp: 90 tablet, Rfl: 3  Current Facility-Administered Medications:  .  triamcinolone acetonide (KENALOG-40) injection 20 mg, 20 mg, Other, Once, Landis Martins, DPM Allergies  Allergen Reactions  . Ceftin Anaphylaxis    Face and throat swell   . Shellfish Allergy Other (See Comments)    Gout exacerbation  . Allopurinol Nausea Only and Other (See Comments)    weakness  . Ativan [Lorazepam] Itching  . Sulfa Antibiotics Itching  . Ultram [Tramadol Hcl] Itching     Social History   Socioeconomic History  . Marital status: Married    Spouse name: Not on file  . Number of children: 2  . Years of education: masters  . Highest education level: Not on file  Occupational History  . Not on file  Social Needs  . Financial resource strain: Not on file  . Food insecurity:    Worry: Not on file    Inability: Not on file  . Transportation needs:    Medical: Not on file    Non-medical: Not on file  Tobacco Use  . Smoking status: Former Smoker    Packs/day: 0.10    Years: 26.00    Pack years: 2.60    Types: Cigarettes    Last attempt to quit: 05/06/2007    Years since quitting: 11.0  . Smokeless tobacco: Never Used  Substance and Sexual Activity  . Alcohol use: No  . Drug use: No  . Sexual activity: Yes  Lifestyle  . Physical activity:    Days per week: Not on file    Minutes per session: Not on file  . Stress: Not on file  Relationships  . Social connections:    Talks on phone: Not on file     Gets together: Not on file    Attends religious service: Not on file    Active member of club or organization: Not on file    Attends meetings of clubs or organizations: Not on file    Relationship status: Not on file  . Intimate partner violence:    Fear of current or ex partner: Not on file    Emotionally abused: Not on file    Physically abused:  Not on file    Forced sexual activity: Not on file  Other Topics Concern  . Not on file  Social History Narrative   Tobacco Use Cigarettes: Former Smoker, Quit in 2008   No Alcohol   No recreational drug use   Diet: Regular/Low Carb   Exercise: None   Occupation: disabled   Education: Research officer, political party, masters   Children: 2   Firearms: No   Therapist, art Use: Always   Former Metallurgist.        Physical Exam Eyes:     Pupils: Pupils are equal, round, and reactive to light.  Neck:     Musculoskeletal: Normal range of motion.  Cardiovascular:     Rate and Rhythm: Normal rate and regular rhythm.     Pulses: Normal pulses.  Pulmonary:     Effort: Pulmonary effort is normal. No respiratory distress.  Abdominal:     General: There is no distension.     Palpations: Abdomen is soft.  Neurological:     Mental Status: She is alert.         Future Appointments  Date Time Provider [place]  05/30/2018  8:55 AM [facility] DEVICE REMOTES [facility]  06/01/2018  3:00 PM [facility] 1 [facility]  06/09/2018 10:30 AM Philemon Kingdom, MD [facility] None  06/15/2018  1:45 PM Marzetta Board, DPM [facility]  06/30/2018 10:00 AM MC ECHO 1-BUZZ [facility]  06/30/2018 11:00 AM Bensimhon, Shaune Pascal, MD [facility] None  07/18/2018  8:05 AM [facility] DEVICE REMOTES [facility]  07/27/2018  8:50 AM [name], Arvin Collard K, DO [facility] None     BP 112/70 (BP Location: Left Arm, Patient Position: Sitting)   Pulse 90   Wt 181 lb 11.2 oz (82.4 kg)   SpO2 99%   BMI 35.49 kg/m   Weight yesterday-184lbs   Last visit weight-181lbs   ATF pt alert and oriented with no new onset of complaints. Patient denied shortness of breath but stated she felt light headed in which she has a history of same. Lung sounds noted to have some congestion on the right lower lobe, all other lobes normal. Patient's medications reviewed and placed in pill box.Patient is compliant with her medications. Patient went to doctor today complaining of CDIFF symptoms, doctors office request culture for testing. Patient stated she would provide them with same. Midodrine 10mg  ordered and note left with patient's husband to fill pill box with same medication once they obtain the medication. Home visit complete.    Medication ordered Midodrine 10mg   Gaje Tennyson, EMT Paramedic [phone] 05/27/2018    ACTION: Home visit completed Next visit planned for 1 week

## 2018-05-28 NOTE — Telephone Encounter (Signed)
Please repeat reading on Monday. thanks

## 2018-05-30 ENCOUNTER — Ambulatory Visit (INDEPENDENT_AMBULATORY_CARE_PROVIDER_SITE_OTHER): Payer: Medicare HMO

## 2018-05-30 ENCOUNTER — Ambulatory Visit
Admission: RE | Admit: 2018-05-30 | Discharge: 2018-05-30 | Disposition: A | Discharge status: Home / Self Care | Payer: Medicare HMO | Source: Ambulatory Visit | Attending: Gastroenterology | Admitting: Gastroenterology

## 2018-05-30 ENCOUNTER — Ambulatory Visit: Payer: Medicare HMO

## 2018-05-30 ENCOUNTER — Telehealth: Payer: Self-pay

## 2018-05-30 DIAGNOSIS — Z95810 Presence of automatic (implantable) cardiac defibrillator: Secondary | ICD-10-CM

## 2018-05-30 DIAGNOSIS — Z8619 Personal history of other infectious and parasitic diseases: Secondary | ICD-10-CM

## 2018-05-30 DIAGNOSIS — R109 Unspecified abdominal pain: Secondary | ICD-10-CM

## 2018-05-30 DIAGNOSIS — R197 Diarrhea, unspecified: Secondary | ICD-10-CM

## 2018-05-30 DIAGNOSIS — K829 Disease of gallbladder, unspecified: Secondary | ICD-10-CM | POA: Diagnosis not present

## 2018-05-30 DIAGNOSIS — I5032 Chronic diastolic (congestive) heart failure: Secondary | ICD-10-CM

## 2018-05-30 MED ORDER — IOPAMIDOL (ISOVUE-300) INJECTION 61%
75.0000 mL | Freq: Once | INTRAVENOUS | Status: AC | PRN
  Administered 2018-05-30: 75 mL via INTRAVENOUS

## 2018-05-30 NOTE — Telephone Encounter (Signed)
Remote transmission scheduled for 05/30/2018

## 2018-05-30 NOTE — Anesthesia Preprocedure Evaluation (Addendum)
Anesthesia Evaluation  Patient identified by MRN, date of birth, ID band Patient awake    Reviewed: Allergy & Precautions, NPO status , Patient's Chart, lab work & pertinent test results, reviewed documented beta blocker date and time   History of Anesthesia Complications Negative for: history of anesthetic complications  Airway Mallampati: I  TM Distance: >3 FB Neck ROM: Full    Dental  (+) Dental Advisory Given   Pulmonary COPD, former smoker (quit 2008),    breath sounds clear to auscultation       Cardiovascular hypertension, Pt. on home beta blockers and Pt. on medications (-) angina+ pacemaker + Cardiac Defibrillator  Rhythm:Regular Rate:Normal  '18 ECHO: EF 55-60%, mod TR   Neuro/Psych  Headaches (on Diamox), Anxiety Depression    GI/Hepatic Neg liver ROS, GERD  Controlled,  Endo/Other  diabetesHypothyroidism Morbid obesity  Renal/GU negative Renal ROS     Musculoskeletal   Abdominal (+) + obese,   Peds  Hematology negative hematology ROS (+)   Anesthesia Other Findings Breast cancer  Reproductive/Obstetrics                            Anesthesia Physical Anesthesia Plan  ASA: III  Anesthesia Plan: MAC   Post-op Pain Management:    Induction:   PONV Risk Score and Plan: 2 and Treatment may vary due to age or medical condition  Airway Management Planned: Natural Airway and Nasal Cannula  Additional Equipment:   Intra-op Plan:   Post-operative Plan:   Informed Consent: I have reviewed the patients History and Physical, chart, labs and discussed the procedure including the risks, benefits and alternatives for the proposed anesthesia with the patient or authorized representative who has indicated his/her understanding and acceptance.   Dental advisory given  Plan Discussed with: CRNA and Surgeon  Anesthesia Plan Comments: (Plan routine monitors, MAC)        Anesthesia Quick Evaluation

## 2018-05-30 NOTE — Telephone Encounter (Signed)
Spoke with pt and reminded pt of remote transmission that is due today. Pt verbalized understanding.   

## 2018-05-30 NOTE — H&P (Signed)
History of Present Illness  General:  68 year old female, had her last colonoscopy 2019 for surveillance, history of colon polyps, was supposed to get a colonoscopy performed but needed cardiology clearance. She developed C diff infection in 10/19, repeat stool studies from 11/19 were negative for C diff. She has had C diff infection in the past, about a few years back, she was treated with metronidazole 500 mg TID for 10 days. Currently she has more formed, she usually has 1/day, if her stomach is upset wtih something she ate the day she may have loose diarrhea like stool. Denies blood in stool or black stools. Denies nausea, vomitting, she has loss of appetite for 3-4 weeks and has lost weight about 10 lbs over a month. She reports that leg swelling is better and is supposed to start using compression stockings. She has a cardiologist- Dr.Bensimon. She has frequent falls due to low BP. Denies difficulty or pain on swallowing,denies acid reflux or heartburn. SHe eats small portions but denies early satiety. Denies bloating. Her father had colon cancer in his 49s.    Current Medications  Taking   Alprazolam 0.25 MG Tablet 1 tablet Orally 1 tab 1 hour before procedure, 1 tab 30 min before procedure. OK to repeat again immediately before   Aripiprazole 5 MG Tablet 1 tablet Orally twice a day and PRN if needed   BuPROPion HCl 150 MG Tablet Extended Release 24 Hour 3 tablet Orally Once a day   Citalopram Hydrobromide 20 MG Tablet 1 tablet Orally Once a day   Clonazepam 0.5 MG Tablet 1 tablet Orally three times a day   Coreg(Carvedilol) 3.125 MG Tablet 1 tablet Orally 1 in the am & 2 in the evening   Fexofenadine HCl 180 MG Tablet 1 tablet Orally once a day   Fluticasone Propionate 50 MCG/ACT Suspension instill 2 sprays into each nostril once daily   Gabapentin 300 MG Capsule 1 capsule Orally Three times a day   Ipratropium Bromide 0.03 % Solution 2 sprays in each nostril Nasally Twice a day    K-Dur 20 meq Tablet 1 tablet Orally once a day   Metolazone 2.5 MG Tablet 1 tablet Orally Once a day   Midodrine HCl 10 MG Tablet 3 tablets Orally twice a day   Montelukast Sodium 10 MG Tablet 1 tablet in the evening Orally Once a day for allergies   Night-Time Cold Medicine(Pseudoeph-Doxylamine-DM-APAP) 30 ml once at night   Simvastatin 10 MG Tablet take 1 tablet by mouth every evening   Spironolactone 25 MG Tablet 1 tablet Orally once a day   Synthroid(L-Thyroxine Sodium) 125 MCG Tablet 1 tablet on an empty stomach in the morning Orally Once a day   Torsemide 20 MG Tablet 3 tablets in the am, 3 tablets in the afternoon Orally as directed   Tylenol(APAP) . Tablet 1 tablet as needed Orally 500 mg every 4 hrs   Uloric(Febuxostat) 40 MG Tablet 1 tablet Orally Once a day   Ventolin HFA(Albuterol Sulfate HFA) 108 (90 Base) MCG/ACT Aerosol Solution inhale 2 puffs by mouth every 4 to 6 hours if needed for wheezing   Zolpidem Tartrate 10 MG Tablet 1 tablet at bedtime Orally Once a day   Gabapentin 800 MG Tablet 1 tablet Orally Once a day   Not-Taking   Metronidazole 500 MG Tablet 1 tablet Orally Three times a day   Polyethylene Glycol 3350 - Powder TAKE 1 CAPFUL AS DIRECTED ONCE A DAY IF NEEDED   Amoxicillin-Pot Clavulanate 875-125  MG Tablet 1 tablet Orally every 12 hrs   Clindamycin HCl 300 MG Capsule 2 capsules Orally once, 30 minutes before dental work   Medication List reviewed and reconciled with the patient    Past Medical History  Chronic headaches.   CHF secondary to cardiomyopathy .   depression/anxiety/chronic insomnia [facility]).   Obesity.   Hypothyroid.   Diabetes.   Hypercholesterolemia.   asthma/COPD.   chronic LBBB.   Normal coronary arteries on catherization, 10/2008, EF 40-45%.   Gout.   mild to mod LV dysfunction EF 40%, grade II diastolic dysfunction by echo 12-2010.   Hx of breast cancer 2012 (remission).   Metolazone preciptated hypokalemia.   Allergic  rhinitis due to pollen.   Cleft palate and cleft lip.   Esophageal reflux.    Surgical History  Left breast lumpectomy/sent node bx-- Donne Hazel, [facility] 07/2010  cleft palate repair   polyps from uteraus   Bi-Ventricular ICD 04/2013  cervial biopsy for cervial cancer - neg 2018    Family History  Father: deceased 62 yrs, colon cancer late 22, diagnosed with Colon cancer  Mother: deceased 81 yrs, alzheimers, CVA, hx of breast cancer, Hypertension, CVA, Breast cancer  only child, Negative family hx of colon polyps or liver disease.   Social History  General:  Firearms at home: no.  EXPOSURE TO PASSIVE SMOKE: yes, yes - husband.  no Alcohol, no.  DIET: regular, low carb.  EDUCATION: [employer].  Seat belt use: always.  no Recreational drug use, no.  Exercise: yes, [employer].  Children: 2 grown children, one with Bipolar disease.  Caffeine: yes, 1 serving coffee every two weeks.  Tobacco use  cigarettes: Former smoker Quit in year 2005 Tobacco history last updated 04/27/2018 Marital Status: married, Married.  OCCUPATION: disability.    Allergies  Ativan: itch - Allergy  Ceftin: anaphylaxis - Allergy  Sulfacet-R: itch - Allergy  Geodon: hives on left arm - Allergy  Allopurinol: nausea, weakness - Side Effects  Lisinopril: angioedema - Allergy   Hospitalization/Major Diagnostic Procedure  c. diff. on 03/30/2006  not in the past year 04/2018   Review of Systems  GI PROCEDURE:  Pacemaker/ AICD YES, YES. no Artificial heart valves. no MI/heart attack. no Abnormal heart rhythm. no Angina. no CVA. no Hypertension. Hypotension YES. no Asthma, COPD. no Sleep apnea. no Seizure disorders. Artificial joints YES. no Severe DJD. no Diabetes. no Significant headaches. no Vertigo. Depression/anxiety YES, both. no Abnormal bleeding. no Kidney Disease. no Liver disease, no. no Chance of pregnancy. no Blood transfusion. no Method of Birth Control. no Birth control pills.      Vital Signs  Wt 193, Wt change -8.6 lb, Ht 61, BMI 36.46, Temp 98.5, Pulse sitting 91, BP sitting 156/87.   Examination  Gastroenterology:: GENERAL APPEARANCE: Well developed, well nourished, no active distress, pleasant.  EYES: Lids and conjunctiva normal. Sclera normal.  ORAL CAVITY: Lips, teeth and gums are normal. Pharynx, tongue, mucosa normal .  SCLERA: anicteric .  CARDIOVASCULAR RRR no murmur, Normal RRR w/o murmers or gallops. No peripheral edema .  RESPIRATORY Breath sounds normal. Respiration even and unlabored .  ABDOMEN No masses palpated. Liver and spleen not palpated, normal. Bowel sounds normal, Abdomen not distended .  EXTREMITIES: No edema, pulses intact .  NEURO: normal strength, normal gait .  PSYCH: mood/affect normal .     Assessments   1. Family history of colon cancer in father - Z80.0 (Primary)   2. History of colon  polyps - Z86.010   3. History of Clostridioides difficile infection - Z86.19   Treatment  1. Family history of colon cancer in father  IMAGING: Colonoscopy    Whitfield,Dia 04/27/2018 03:16:27 PM > spoke with Kim-scheduled for 05/31/18-[facility]-consent read and signed-prep instructions reviewed with pt.   Referral To: Reason:colon-trilyte-propofol-spoke with Kim-[facility]-#[id number]    2. History of colon polyps  IMAGING: Colonoscopy    Whitfield,Dia 04/27/2018 03:16:27 PM > spoke with Kim-scheduled for 05/31/18-[facility]-consent read and signed-prep instructions reviewed with pt.   3. History of Clostridioides difficile infection  Notes: Advised patient to avoid antibiotics unless needed, ok to use OTC probiotics.

## 2018-05-30 NOTE — Progress Notes (Signed)
EPIC Encounter for ICM Monitoring  Patient Name: Rhonda [name] is a 68 y.o. female Date: 05/30/2018 Primary Care Physican: Rhonda Small, MD Primary Cardiologist:Rhonda [name] Electrophysiologist: Rhonda [name] Pacing: 99.9% LastWeight:184lbs  Today's Weight:  183 lbs        Heart Failure questions reviewed, pt asymptomatic. Weight dropped from 192 lbs to 183 lbs.   Thoracic impedance returned to normal after taking extra Torsemide 12/27 & 12/28 and 1 2.5 mg tablet of Metolazone with additional 40 mEq Potassium on 05/24/2018   Prescribed: Torsemide 20 mg3tablets (60 mg total)once aday.Per11/6/2019HFoffice note,take an extra 60 mg PRN.Potassium 20 meq 3tablets (60 mEq total)twice a day. Metolazone 2.26m 1 tablet by mouth if needed for fluid or edema.  Labs: 03/30/2018 Creatinine 1.63, BUN 26, Potassium 3.9, Sodium 139, eGFR 32-37 02/10/2018 Creatinine 1.85, BUN 41, Potassium 3.9, Sodium 138, EGFR 28-33 10/29/2017 Creatinine 1.45, BUN 24, Potassium 3.9, Sodium 141, EGFR 37-43 10/21/2017 Creatinine 1.57, BUN 19, Potassium 3.8, Sodium 140, EGFR 34-39 09/23/2017 Creatinine 1.26, BUN 29, Potassium 3.2, Sodium 142, EGFR 49-56 09/09/2017 Creatinine 1.22, BUN 20, Potassium 3.4, Sodium 140, EGFR 46-53 08/25/2017 Creatinine 1.52, BUN 47, Potassium 3.1, Sodium 137, EGFR 35-41 07/28/2017 Creatinine 1.69, BUN 53, Potassium 3.0, Sodium 139, EGFR 31-36 (low potassium addressed by HF clinic) 06/21/2017 Creatinine 2.06, BUN 38, Potassium 2.5, Sodium 136, EGFR 24-28  Recommendations: No changes.  Patient having colonoscopy on 05/31/2018.  Follow-up plan: ICM clinic phone appointment on 06/27/2018.   Office appointment scheduled 06/30/2018 with Dr. BHaroldine [name]    Copy of ICM check sent to Dr. TLovena [name] Dr Rhonda Lawsto provide updated transmission results.   3 month ICM trend: 05/30/2018    1 Year ICM trend:       [name] RN 05/30/2018 12:05 PM

## 2018-05-31 ENCOUNTER — Ambulatory Visit (HOSPITAL_COMMUNITY): Payer: Medicare HMO | Admitting: Anesthesiology

## 2018-05-31 ENCOUNTER — Encounter (HOSPITAL_COMMUNITY)
Admission: RE | Disposition: A | Discharge status: Home / Self Care | Payer: Self-pay | Source: Home / Self Care | Attending: Gastroenterology

## 2018-05-31 ENCOUNTER — Encounter (HOSPITAL_COMMUNITY): Payer: Self-pay | Admitting: Anesthesiology

## 2018-05-31 ENCOUNTER — Ambulatory Visit (HOSPITAL_COMMUNITY)
Admission: RE | Admit: 2018-05-31 | Discharge: 2018-05-31 | Disposition: A | Discharge status: Home / Self Care | Payer: Medicare HMO | Attending: Gastroenterology | Admitting: Gastroenterology

## 2018-05-31 ENCOUNTER — Other Ambulatory Visit: Payer: Self-pay

## 2018-05-31 DIAGNOSIS — Z87891 Personal history of nicotine dependence: Secondary | ICD-10-CM | POA: Insufficient documentation

## 2018-05-31 DIAGNOSIS — I5022 Chronic systolic (congestive) heart failure: Secondary | ICD-10-CM | POA: Diagnosis not present

## 2018-05-31 DIAGNOSIS — Z85038 Personal history of other malignant neoplasm of large intestine: Secondary | ICD-10-CM | POA: Diagnosis not present

## 2018-05-31 DIAGNOSIS — J449 Chronic obstructive pulmonary disease, unspecified: Secondary | ICD-10-CM | POA: Diagnosis not present

## 2018-05-31 DIAGNOSIS — F419 Anxiety disorder, unspecified: Secondary | ICD-10-CM | POA: Diagnosis not present

## 2018-05-31 DIAGNOSIS — E119 Type 2 diabetes mellitus without complications: Secondary | ICD-10-CM | POA: Insufficient documentation

## 2018-05-31 DIAGNOSIS — Z6835 Body mass index (BMI) 35.0-35.9, adult: Secondary | ICD-10-CM | POA: Insufficient documentation

## 2018-05-31 DIAGNOSIS — I110 Hypertensive heart disease with heart failure: Secondary | ICD-10-CM | POA: Insufficient documentation

## 2018-05-31 DIAGNOSIS — Z79899 Other long term (current) drug therapy: Secondary | ICD-10-CM | POA: Diagnosis not present

## 2018-05-31 DIAGNOSIS — I447 Left bundle-branch block, unspecified: Secondary | ICD-10-CM | POA: Diagnosis not present

## 2018-05-31 DIAGNOSIS — Z823 Family history of stroke: Secondary | ICD-10-CM | POA: Insufficient documentation

## 2018-05-31 DIAGNOSIS — M722 Plantar fascial fibromatosis: Secondary | ICD-10-CM

## 2018-05-31 DIAGNOSIS — I509 Heart failure, unspecified: Secondary | ICD-10-CM | POA: Diagnosis not present

## 2018-05-31 DIAGNOSIS — F329 Major depressive disorder, single episode, unspecified: Secondary | ICD-10-CM | POA: Insufficient documentation

## 2018-05-31 DIAGNOSIS — Z8249 Family history of ischemic heart disease and other diseases of the circulatory system: Secondary | ICD-10-CM | POA: Insufficient documentation

## 2018-05-31 DIAGNOSIS — Z853 Personal history of malignant neoplasm of breast: Secondary | ICD-10-CM | POA: Insufficient documentation

## 2018-05-31 DIAGNOSIS — M109 Gout, unspecified: Secondary | ICD-10-CM | POA: Insufficient documentation

## 2018-05-31 DIAGNOSIS — F5104 Psychophysiologic insomnia: Secondary | ICD-10-CM | POA: Insufficient documentation

## 2018-05-31 DIAGNOSIS — Z820 Family history of epilepsy and other diseases of the nervous system: Secondary | ICD-10-CM | POA: Insufficient documentation

## 2018-05-31 DIAGNOSIS — D124 Benign neoplasm of descending colon: Secondary | ICD-10-CM | POA: Insufficient documentation

## 2018-05-31 DIAGNOSIS — Z800 Family history of malignant neoplasm of digestive organs: Secondary | ICD-10-CM | POA: Insufficient documentation

## 2018-05-31 DIAGNOSIS — Z1211 Encounter for screening for malignant neoplasm of colon: Secondary | ICD-10-CM | POA: Diagnosis not present

## 2018-05-31 DIAGNOSIS — K219 Gastro-esophageal reflux disease without esophagitis: Secondary | ICD-10-CM | POA: Diagnosis not present

## 2018-05-31 DIAGNOSIS — Z86010 Personal history of colonic polyps: Secondary | ICD-10-CM | POA: Diagnosis not present

## 2018-05-31 DIAGNOSIS — E7800 Pure hypercholesterolemia, unspecified: Secondary | ICD-10-CM | POA: Insufficient documentation

## 2018-05-31 DIAGNOSIS — I429 Cardiomyopathy, unspecified: Secondary | ICD-10-CM | POA: Diagnosis not present

## 2018-05-31 DIAGNOSIS — E039 Hypothyroidism, unspecified: Secondary | ICD-10-CM | POA: Diagnosis not present

## 2018-05-31 DIAGNOSIS — Z888 Allergy status to other drugs, medicaments and biological substances status: Secondary | ICD-10-CM | POA: Insufficient documentation

## 2018-05-31 DIAGNOSIS — Z882 Allergy status to sulfonamides status: Secondary | ICD-10-CM | POA: Insufficient documentation

## 2018-05-31 DIAGNOSIS — D123 Benign neoplasm of transverse colon: Secondary | ICD-10-CM | POA: Diagnosis not present

## 2018-05-31 DIAGNOSIS — Z881 Allergy status to other antibiotic agents status: Secondary | ICD-10-CM | POA: Insufficient documentation

## 2018-05-31 DIAGNOSIS — Z803 Family history of malignant neoplasm of breast: Secondary | ICD-10-CM | POA: Insufficient documentation

## 2018-05-31 HISTORY — PX: BIOPSY: SHX5522

## 2018-05-31 HISTORY — PX: COLONOSCOPY: SHX5424

## 2018-05-31 LAB — GLUCOSE, CAPILLARY: Glucose-Capillary: 103 mg/dL — ABNORMAL HIGH (ref 70–99)

## 2018-05-31 SURGERY — COLONOSCOPY
Anesthesia: Monitor Anesthesia Care

## 2018-05-31 MED ORDER — PROPOFOL 500 MG/50ML IV EMUL
INTRAVENOUS | Status: DC | PRN
  Administered 2018-05-31: 10 mg via INTRAVENOUS
  Administered 2018-05-31: 40 mg via INTRAVENOUS

## 2018-05-31 MED ORDER — PROPOFOL 10 MG/ML IV BOLUS
INTRAVENOUS | Status: AC
  Filled 2018-05-31: qty 40

## 2018-05-31 MED ORDER — LACTATED RINGERS IV SOLN
INTRAVENOUS | Status: DC
  Administered 2018-05-31: 08:00:00 via INTRAVENOUS

## 2018-05-31 MED ORDER — PROPOFOL 500 MG/50ML IV EMUL
INTRAVENOUS | Status: DC | PRN
  Administered 2018-05-31: 125 ug/kg/min via INTRAVENOUS

## 2018-05-31 MED ORDER — ONDANSETRON HCL 4 MG/2ML IJ SOLN
INTRAMUSCULAR | Status: DC | PRN
  Administered 2018-05-31: 4 mg via INTRAVENOUS

## 2018-05-31 MED ORDER — CARVEDILOL 3.125 MG PO TABS
3.1250 mg | ORAL_TABLET | Freq: Once | ORAL | Status: AC
  Administered 2018-05-31: 3.125 mg via ORAL
  Filled 2018-05-31: qty 1

## 2018-05-31 MED ORDER — SODIUM CHLORIDE 0.9 % IV SOLN: INTRAVENOUS | Status: DC

## 2018-05-31 NOTE — Brief Op Note (Signed)
05/31/2018  8:36 AM  PATIENT:  [name]  68 y.o. female  PRE-OPERATIVE DIAGNOSIS:  Family history of colon cancer in father, History of colon polyps  POST-OPERATIVE DIAGNOSIS:  polyps  PROCEDURE:  Procedure(s): COLONOSCOPY (N/A) BIOPSY  SURGEON:  Surgeon(s) and Role:    Ronnette Juniper, MD - Primary  PHYSICIAN ASSISTANT:   ASSISTANTS: Cleda Daub, RN, Tinnie Gens, Tech  ANESTHESIA:   MAC  EBL:  Minimal  BLOOD ADMINISTERED:none  DRAINS: none   LOCAL MEDICATIONS USED:  NONE  SPECIMEN:  Biopsy / Limited Resection  DISPOSITION OF SPECIMEN:  PATHOLOGY  COUNTS:  YES  TOURNIQUET:  * No tourniquets in log *  DICTATION: .Dragon Dictation  PLAN OF CARE: Discharge to home after PACU  PATIENT DISPOSITION:  PACU - hemodynamically stable.   Delay start of Pharmacological VTE agent (>24hrs) due to surgical blood loss or risk of bleeding: no

## 2018-05-31 NOTE — Anesthesia Postprocedure Evaluation (Signed)
Anesthesia Post Note  Patient: Rhonda [name]  Procedure(s) Performed: COLONOSCOPY (N/A ) BIOPSY     Patient location during evaluation: Endoscopy Anesthesia Type: MAC Level of consciousness: awake and alert, oriented and patient cooperative Pain management: pain level controlled Vital Signs Assessment: post-procedure vital signs reviewed and stable Respiratory status: spontaneous breathing, nonlabored ventilation and respiratory function stable Cardiovascular status: blood pressure returned to baseline and stable Postop Assessment: no apparent nausea or vomiting Anesthetic complications: no    Last Vitals:  Vitals:   05/31/18 0730 05/31/18 0838  BP: (!) 147/79 (!) 147/61  Pulse: 90 74  Resp: 19 15  Temp: 37.1 C   SpO2: 99% 100%    Last Pain:  Vitals:   05/31/18 0730  TempSrc: Oral  PainSc: 0-No pain                 Angas Isabell,E. Maggi Hershkowitz

## 2018-05-31 NOTE — Transfer of Care (Signed)
Immediate Anesthesia Transfer of Care Note  Patient: Rhonda [name]  Procedure(s) Performed: Procedure(s): COLONOSCOPY (N/A) BIOPSY  Patient Location: PACU  Anesthesia Type:MAC  Level of Consciousness:  sedated, patient cooperative and responds to stimulation  Airway & Oxygen Therapy:Patient Spontanous Breathing and Patient connected to face mask oxgen  Post-op Assessment:  Report given to PACU RN and Post -op Vital signs reviewed and stable  Post vital signs:  Reviewed and stable  Last Vitals:  Vitals:   05/31/18 0730  BP: (!) 147/79  Pulse: 90  Resp: 19  Temp: 37.1 C  SpO2: 03%    Complications: No apparent anesthesia complications

## 2018-05-31 NOTE — Discharge Instructions (Signed)
Colonoscopy, Adult, Care After °This sheet gives you information about how to care for yourself after your procedure. Your doctor may also give you more specific instructions. If you have problems or questions, call your doctor. °What can I expect after the procedure? °After the procedure, it is common to have: °· A small amount of blood in your poop for 24 hours. °· Some gas. °· Mild cramping or bloating in your belly. °Follow these instructions at home: °General instructions °· For the first 24 hours after the procedure: °? Do not drive or use machinery. °? Do not sign important documents. °? Do not drink alcohol. °? Do your daily activities more slowly than normal. °? Eat foods that are soft and easy to digest. °· Take over-the-counter or prescription medicines only as told by your doctor. °To help cramping and bloating: ° °· Try walking around. °· Put heat on your belly (abdomen) as told by your doctor. Use a heat source that your doctor recommends, such as a moist heat pack or a heating pad. °? Put a towel between your skin and the heat source. °? Leave the heat on for 20-30 minutes. °? Remove the heat if your skin turns bright red. This is especially important if you cannot feel pain, heat, or cold. You can get burned. °Eating and drinking ° °· Drink enough fluid to keep your pee (urine) clear or pale yellow. °· Return to your normal diet as told by your doctor. Avoid heavy or fried foods that are hard to digest. °· Avoid drinking alcohol for as long as told by your doctor. °Contact a doctor if: °· You have blood in your poop (stool) 2-3 days after the procedure. °Get help right away if: °· You have more than a small amount of blood in your poop. °· You see large clumps of tissue (blood clots) in your poop. °· Your belly is swollen. °· You feel sick to your stomach (nauseous). °· You throw up (vomit). °· You have a fever. °· You have belly pain that gets worse, and medicine does not help your  pain. °Summary °· After the procedure, it is common to have a small amount of blood in your poop. You may also have mild cramping and bloating in your belly. °· For the first 24 hours after the procedure, do not drive or use machinery, do not sign important documents, and do not drink alcohol. °· Get help right away if you have a lot of blood in your poop, feel sick to your stomach, have a fever, or have more belly pain. °This information is not intended to replace advice given to you by your health care provider. Make sure you discuss any questions you have with your health care provider. °Document Released: 06/13/2010 Document Revised: 03/11/2017 Document Reviewed: 02/03/2016 °Elsevier Interactive Patient Education © 2019 [employer] ° °

## 2018-05-31 NOTE — Interval H&P Note (Signed)
History and Physical Interval Note: 67/female with family history of colon cancer(father), last colonoscopy in 2013,personal history of colon polyps, recent C diff infection and diarrhea for a colonoscopy.  05/31/2018 8:01 AM  [name]  has presented today for colonoscopy, with the diagnosis of Family history of colon cancer in father, History of colon polyps  The various methods of treatment have been discussed with the patient and family. After consideration of risks, benefits and other options for treatment, the patient has consented to  Procedure(s): COLONOSCOPY (N/A) as a surgical intervention .  The patient's history has been reviewed, patient examined, no change in status, stable for surgery.  I have reviewed the patient's chart and labs.  Questions were answered to the patient's satisfaction.     Ronnette Juniper

## 2018-05-31 NOTE — Op Note (Signed)
[facility] Patient Name: Rhonda [name] Procedure Date: 05/31/2018 MRN: [id number] Attending MD: Ronnette Juniper , MD Date of Birth: 1950-10-03 CSN: [id number] Age: 68 Admit Type: Outpatient Procedure:                Colonoscopy Indications:              Surveillance: Personal history of adenomatous                            polyps on last colonoscopy > 5 years ago, Family                            history of colon cancer in a first-degree relative,                            Last colonoscopy: 2013, Incidental - Diarrhea Providers:                Ronnette Juniper, MD, Cleda Daub, RN, Tinnie Gens,                            Technician, Arnoldo Hooker, CRNA Referring MD:              Medicines:                Monitored Anesthesia Care Complications:            No immediate complications. Estimated blood loss:                            None. Estimated Blood Loss:     Estimated blood loss: none. Procedure:                Pre-Anesthesia Assessment:                           - Prior to the procedure, a History and Physical                            was performed, and patient medications and                            allergies were reviewed. The patient's tolerance of                            previous anesthesia was also reviewed. The risks                            and benefits of the procedure and the sedation                            options and risks were discussed with the patient.                            All questions were answered, and informed consent  was obtained. Prior Anticoagulants: The patient has                            taken no previous anticoagulant or antiplatelet                            agents. ASA Grade Assessment: III - A patient with                            severe systemic disease. After reviewing the risks                            and benefits, the patient was deemed in                            satisfactory  condition to undergo the procedure.                           After obtaining informed consent, the colonoscope                            was passed under direct vision. Throughout the                            procedure, the patient's blood pressure, pulse, and                            oxygen saturations were monitored continuously. The                            [id number]-[id number] ([id number] Olympus peds colonscope was                            introduced through the anus and advanced to the the                            cecum, identified by appendiceal orifice and                            ileocecal valve. The colonoscopy was performed                            without difficulty. The patient tolerated the                            procedure well. The quality of the bowel                            preparation was adequate to identify polyps 6 mm                            and larger in size. Scope In: 8:08:41 AM Scope Out: 8:30:32 AM Scope Withdrawal Time: 0 hours 12 minutes 33 seconds  Total Procedure Duration:  0 hours 21 minutes 51 seconds  Findings:      The perianal and digital rectal examinations were normal.      Seven sessile polyps were found in the descending colon and transverse       colon. The polyps were 4 to 6 mm in size. These polyps were removed with       a piecemeal technique using a cold biopsy forceps. Resection and       retrieval were complete.      Biopsies for histology were taken with a cold forceps from the random       areas of right and left colon for evaluation of microscopic colitis.      The exam was otherwise without abnormality on direct and retroflexion       views. Impression:               - Seven 4 to 6 mm polyps in the descending colon                            and in the transverse colon, removed piecemeal                            using a cold biopsy forceps. Resected and retrieved.                           - The examination was otherwise  normal on direct                            and retroflexion views.                           - Biopsies were taken with a cold forceps from the                            random areas of right and left colon for evaluation                            of microscopic colitis. Moderate Sedation:      Patient did not receive moderate sedation for this procedure, but       instead received monitored anesthesia care. Recommendation:           - Patient has a contact number available for                            emergencies. The signs and symptoms of potential                            delayed complications were discussed with the                            patient. Return to normal activities tomorrow.                            Written discharge instructions were provided to the  patient.                           - Resume regular diet.                           - Continue present medications.                           - Await pathology results.                           - Repeat colonoscopy for surveillance based on                            pathology results. Procedure Code(s):        --- Professional ---                           [id number], Colonoscopy, flexible; with biopsy, single                            or multiple Diagnosis Code(s):        --- Professional ---                           Z86.010, Personal history of colonic polyps                           D12.4, Benign neoplasm of descending colon                           D12.3, Benign neoplasm of transverse colon (hepatic                            flexure or splenic flexure)                           Z80.0, Family history of malignant neoplasm of                            digestive organs CPT copyright 2018 [employer]. All rights reserved. The codes documented in this report are preliminary and upon coder review may  be revised to meet current compliance requirements. Ronnette Juniper,  MD 05/31/2018 8:35:54 AM This report has been signed electronically. Number of Addenda: 0

## 2018-06-01 ENCOUNTER — Other Ambulatory Visit: Payer: Medicare HMO

## 2018-06-02 ENCOUNTER — Other Ambulatory Visit (HOSPITAL_COMMUNITY): Payer: Self-pay

## 2018-06-02 ENCOUNTER — Encounter (HOSPITAL_COMMUNITY): Payer: Self-pay | Admitting: Gastroenterology

## 2018-06-02 ENCOUNTER — Telehealth (HOSPITAL_COMMUNITY): Payer: Self-pay

## 2018-06-02 NOTE — Progress Notes (Signed)
Paramedicine Encounter    Patient ID: Rhonda [name], female    DOB: 08/14/1950, 68 y.o.   MRN: [id number]    Patient Care Team: Maurice Small, MD as PCP - General (Family Medicine) [name], LCSW as Social Worker (Licensed Clinical Social Worker)  Patient Active Problem List   Diagnosis Date Noted  . Orthostatic hypotension 07/28/2017  . SVD (spontaneous vaginal delivery)   . Peripheral neuropathy   . On home oxygen therapy   . Migraines   . Left bundle branch block   . Hypothyroidism   . Hypertension   . Hyperlipidemia   . Heart murmur   . GERD (gastroesophageal reflux disease)   . Exertional shortness of breath   . Depression   . Complication of anesthesia   . Back pain   . Arthritis   . Anxiety   . Anemia   . HTN (hypertension) 09/14/2013  . Chronic respiratory failure ([place]) 09/14/2013  . 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 Morbid obesity ([place]) 11/01/2012  . Chronic systolic heart failure ([place]) 10/27/2012  . S/P right TKA 05/10/2012  . Endometrial polyp 01/20/2012  . Breast cancer, stage 1 ([place]) 03/26/2011  . Unspecified vitamin D deficiency 03/26/2011    Current Outpatient Medications:  .  acetaZOLAMIDE (DIAMOX) 125 MG tablet, TAKE 1 TABLET(125 MG) BY MOUTH DAILY (Patient taking differently: Take 125 mg by mouth daily. ), Disp: 30 tablet, Rfl: 5 .  buPROPion (WELLBUTRIN XL) 150 MG 24 hr tablet, Take 450 mg by mouth every morning. , Disp: , Rfl:  .  carvedilol (COREG) 3.125 MG tablet, Take 3.125 mg by mouth 2 (two) times daily with a meal., Disp: , Rfl:  .  clonazePAM (KLONOPIN) 0.5 MG tablet, Take 0.5 mg by mouth 3 (three) times daily as needed for anxiety. , Disp: , Rfl:  .  colestipol (COLESTID) 1 g tablet, Take 2 g by mouth 2 (two) times daily. , Disp: , Rfl:  .  gabapentin (NEURONTIN) 300 MG capsule, Take 300 mg by mouth 3 (three) times daily. , Disp: , Rfl:  .  levothyroxine (SYNTHROID, LEVOTHROID) 100 MCG  tablet, Take 1 tablet (100 mcg total) by mouth daily., Disp: 45 tablet, Rfl: 3 .  midodrine (PROAMATINE) 10 MG tablet, Take 1 tablet (10 mg total) by mouth 3 (three) times daily with meals., Disp: 90 tablet, Rfl: 3 .  montelukast (SINGULAIR) 10 MG tablet, Take 10 mg by mouth at bedtime. , Disp: , Rfl: 1 .  Multiple Vitamin (MULTIVITAMIN WITH MINERALS) TABS tablet, Take 1 tablet by mouth daily. One-A-Day 50+, Disp: , Rfl:  .  potassium chloride SA (K-DUR,KLOR-CON) 20 MEQ tablet, Take 3 tablets (60 mEq total) by mouth 2 (two) times daily., Disp: 180 tablet, Rfl: 11 .  simvastatin (ZOCOR) 10 MG tablet, Take 10 mg by mouth every evening. , Disp: , Rfl:  .  torsemide (DEMADEX) 20 MG tablet, Take 3 tablets (60 mg total) by mouth daily. (Patient taking differently: Take 60 mg by mouth 2 (two) times daily. ), Disp: 180 tablet, Rfl: 3 .  zolpidem (AMBIEN) 10 MG tablet, Take 10 mg by mouth at bedtime as needed for sleep. For sleep , Disp: , Rfl:  .  acetaminophen (TYLENOL) 500 MG tablet, Take 1,000-1,500 mg by mouth every 6 (six) hours as needed (for pain.)., Disp: , Rfl:  .  carboxymethylcellulose (REFRESH PLUS) 0.5 % SOLN, Place 1 drop into both eyes 3 (three) times daily as needed (dry eyes)., Disp: ,  Rfl:  .  clindamycin (CLEOCIN) 300 MG capsule, Take 600 mg by mouth See admin instructions. Take 2 capsules (600 mg) by mouth 1 hour prior to dental procedure., Disp: , Rfl:  .  fluticasone (FLONASE) 50 MCG/ACT nasal spray, Place 2 sprays into both nostrils daily as needed for allergies. , Disp: , Rfl: 7 .  gabapentin (NEURONTIN) 800 MG tablet, Take 800 mg by mouth at bedtime as needed (neuropathy). , Disp: , Rfl:  .  ipratropium (ATROVENT) 0.03 % nasal spray, Place 2 sprays into both nostrils every 12 (twelve) hours as needed for rhinitis., Disp: , Rfl:  .  metolazone (ZAROXOLYN) 2.5 MG tablet, take 1 tablet by mouth if needed for FLUID OR EDEMA (Patient taking differently: Take 2.5 mg by mouth daily as needed  (fluid retention/edema (weight gain of greater than 6 lbs)). ), Disp: 30 tablet, Rfl: 2 .  midodrine (PROAMATINE) 5 MG tablet, Take 5 mg by mouth 3 (three) times daily with meals., Disp: , Rfl:  .  polyethylene glycol (MIRALAX / GLYCOLAX) packet, Take 17 g by mouth daily as needed for moderate constipation., Disp: , Rfl:  .  spironolactone (ALDACTONE) 25 MG tablet, Take 0.5 tablets (12.5 mg total) by mouth daily. (Patient taking differently: Take 12.5 mg by mouth every evening. ), Disp: 45 tablet, Rfl: 3 Allergies  Allergen Reactions  . Ceftin Anaphylaxis    Face and throat swell   . Shellfish Allergy Other (See Comments)    Gout exacerbation  . Allopurinol Nausea Only and Other (See Comments)    weakness  . Ativan [Lorazepam] Itching  . Sulfa Antibiotics Itching  . Ultram [Tramadol Hcl] Itching     Social History   Socioeconomic History  . Marital status: Married    Spouse name: Not on file  . Number of children: 2  . Years of education: masters  . Highest education level: Not on file  Occupational History  . Not on file  Social Needs  . Financial resource strain: Not on file  . Food insecurity:    Worry: Not on file    Inability: Not on file  . Transportation needs:    Medical: Not on file    Non-medical: Not on file  Tobacco Use  . Smoking status: Former Smoker    Packs/day: 0.10    Years: 26.00    Pack years: 2.60    Types: Cigarettes    Last attempt to quit: 05/06/2007    Years since quitting: 11.0  . Smokeless tobacco: Never Used  Substance and Sexual Activity  . Alcohol use: No  . Drug use: No  . Sexual activity: Yes  Lifestyle  . Physical activity:    Days per week: Not on file    Minutes per session: Not on file  . Stress: Not on file  Relationships  . Social connections:    Talks on phone: Not on file    Gets together: Not on file    Attends religious service: Not on file    Active member of club or organization: Not on file    Attends meetings of  clubs or organizations: Not on file    Relationship status: Not on file  . Intimate partner violence:    Fear of current or ex partner: Not on file    Emotionally abused: Not on file    Physically abused: Not on file    Forced sexual activity: Not on file  Other Topics Concern  . Not on file  Social History Narrative   Tobacco Use Cigarettes: Former Smoker, Quit in 2008   No Alcohol   No recreational drug use   Diet: Regular/Low Carb   Exercise: None   Occupation: disabled   Education: Research officer, political party, masters   Children: 2   Firearms: No   Therapist, art Use: Always   Former Metallurgist.        Physical Exam Pulmonary:     Effort: Pulmonary effort is normal. No respiratory distress.  Musculoskeletal:        General: No swelling.     Right lower leg: No edema.     Left lower leg: No edema.         Future Appointments  Date Time Provider [place]  06/09/2018 10:30 AM Philemon Kingdom, MD [facility] None  06/15/2018  1:45 PM Marzetta Board, DPM [facility]  06/27/2018  8:30 AM [facility] DEVICE REMOTES [facility]  06/30/2018 10:00 AM [facility] ECHO 1-BUZZ [facility]  06/30/2018 11:00 AM Bensimhon, Shaune Pascal, MD [facility] None  07/18/2018  8:05 AM [facility] DEVICE REMOTES [facility]  07/27/2018  8:50 AM Patel, Arvin Collard K, DO [facility] None     BP 102/68   Pulse 72   Wt 183 lb 9.6 oz (83.3 kg)   SpO2 98%   BMI 35.86 kg/m   Weight yesterday-179 Last visit weight-181  ATF pt CAO x4 sitting in the livingroom c/o weakness. She stated that she hasn't had much energy since the colonoscopy the other day.  She stated that she doesn't have an appetite.  Pt requested that her anxiety medication be placed in the evening or night dose also.  I advised her that it will be placed in the am, noon and evening dose.  She also has the option to take an extra pill PRN.  I advised her husband of the same.  I put the rx bottle back into the cabinet due  to her ongoing memory trouble.    She denies sob, chest pain and dizziness today. She stated that "everyone is getting on her nerves and she doesn't want to be bothered".  Pt had missed several doses of meds this week.  rx bottled verified and pill box refilled.   Pt is fully clothed with a heavy thermal house coat when she weighed during this visit.  Medication ordered: Acetazolamide Bupropion  Nocole Zammit, EMT Paramedic [phone] 06/02/2018    ACTION: Home visit completed

## 2018-06-02 NOTE — Telephone Encounter (Signed)
Pt called to confirm today's [facility] visit @ 1:30.

## 2018-06-08 ENCOUNTER — Other Ambulatory Visit: Payer: Self-pay | Admitting: Gastroenterology

## 2018-06-08 DIAGNOSIS — R932 Abnormal findings on diagnostic imaging of liver and biliary tract: Secondary | ICD-10-CM

## 2018-06-09 ENCOUNTER — Ambulatory Visit (INDEPENDENT_AMBULATORY_CARE_PROVIDER_SITE_OTHER): Payer: Medicare HMO | Admitting: Internal Medicine

## 2018-06-09 ENCOUNTER — Telehealth (HOSPITAL_COMMUNITY): Payer: Self-pay

## 2018-06-09 ENCOUNTER — Encounter: Payer: Self-pay | Admitting: Internal Medicine

## 2018-06-09 VITALS — BP 110/60 | HR 72 | Ht 60.0 in | Wt 185.0 lb

## 2018-06-09 DIAGNOSIS — E039 Hypothyroidism, unspecified: Secondary | ICD-10-CM | POA: Diagnosis not present

## 2018-06-09 NOTE — Telephone Encounter (Signed)
Mrs. Isaacson husband called from the physicians office and stated that the physician would like pt to take the levothryoxin first thing in the morning about 4 hours before she take her other meds for better absorption. She also advised pt to the multiple vitamin in the evening. He stated that he will give me the full report later today.

## 2018-06-09 NOTE — Patient Instructions (Addendum)
Please continue Levothyroxine 100 mcg in am.  Take the thyroid hormone every day, with water, at least 30 minutes before breakfast, separated by at least 4 hours from: - acid reflux medications - calcium - iron - multivitamins  Please move Levothyroxine to first thing in am.  Make sure you take Colestipol at least 4h later.  Move multivitamin at night.  Please come back for labs 1.5 months after this change.  Please come back for a follow-up appointment in 6 months.

## 2018-06-09 NOTE — Progress Notes (Signed)
Patient ID: Rhonda [name], female   DOB: 03/31/1951, 68 y.o.   MRN: [id number]    HPI  DONNISHA [name] is a 68 y.o.-year-old female, initially referred by her neurologist, Dr. [name], now returning for follow-up for hypothyroidism. Last visit 4 mo ago.   Son accompanies her today but unfortunately he cannot clarify outpatient should he takes the levothyroxine..  Reviewed and addended history: Pt. has been dx with hypothyroidism ~2009 -started on levothyroxine, currently on 100 mcg daily.  At last visit, she was taking levothyroxine at night, after dinner, occasionally skipping doses because of falling asleep.  We move this to morning and decreased her dose.  She is currently taking the levothyroxine: - in am (she was confused whether she actually takes this in the morning >> we called the paramedic that organizes her meds >> still taking it at night...) - no Ca, Fe, PPIs - + MVI still in am >> did not move this at night as advised at last visit... - not on Biotin  She recently started Colestipol - 04/2018 -unclear when she is actually taking this, most likely in the morning.  Reviewed patient's TFTs: Lab Results  Component Value Date   TSH 1.20 02/10/2018   Lab Results  Component Value Date   TSH 0.14 (L) 12/08/2017   TSH 3.69 08/05/2009   TSH 0.372  11/02/2008   TSH 0.781 09/16/2008   FREET4 1.25 12/09/2017   T3FREE 2.9 12/09/2017   Antithyroid antibodies: No results found for: THGAB No components found for: TPOAB  Pt denies: - feeling nodules in neck - hoarseness - dysphagia - choking - SOB with lying down  She has no FH of thyroid disorders. No FH of thyroid cancer. No h/o radiation tx to head or neck.  No herbal supplements. No Biotin use. No recent steroids use.   Patient also has a history of hyperparathyroidism, which appeared to be secondary to CKD: Lab Results  Component Value Date   PTH 31 02/10/2018   PTH 109 (H) 12/08/2017   CALCIUM 9.0 03/30/2018   CALCIUM 9.4 02/10/2018   CALCIUM 9.2 12/08/2017   CALCIUM 9.1 10/29/2017   CALCIUM 8.8 (L) 10/21/2017   CALCIUM 9.2 09/23/2017   CALCIUM 8.6 (L) 09/09/2017   CALCIUM 9.3 08/25/2017   CALCIUM 8.9 07/28/2017   CALCIUM 9.0 06/21/2017   No vitamin D deficiency. Lab Results  Component Value Date   VD25OH 39.48 02/10/2018   VD25OH 49 07/16/2011  On a multivitamin.  + CKD: Lab Results  Component Value Date   BUN 26 (H) 03/30/2018   Lab Results  Component Value Date   CREATININE 1.63 (H) 03/30/2018   No history of kidney stones or osteoporosis.  She has a history of breast cancer.  ROS: Constitutional: + weight loss,+ fatigue, no subjective hyperthermia, no subjective hypothermia, + nocturia Eyes: no blurry vision, no xerophthalmia ENT: no sore throat, + see HPI Cardiovascular: no CP/no SOB/no palpitations/no leg swelling Respiratory: no cough/no SOB/no wheezing Gastrointestinal: no N/no V/+ D/no C/no acid reflux Musculoskeletal: no muscle aches/no joint aches Skin: no rashes, no hair loss Neurological: no tremors/no numbness/no tingling/no dizziness  I reviewed pt's medications, allergies, PMH, social hx, family hx, and changes were documented in the history of present illness. Otherwise, unchanged from my initial visit note.  Past Medical History:  Diagnosis Date  . Anemia   . Anxiety   . Arthritis    RIGHT KNEE ARTHRITIS AND PAIN-PT TOLD BONE ON BONE  . Asthma   .  Back pain    DISK PROBLEM  . Breast cancer, stage 1 ([place]) 03/26/2011   left-FINISHED CHEMO  AND RADIATION  . Cardiomyopathy    [name]  . Chronic systolic heart failure ([facility])    a) NICM b) ECHO (03/2013) EF 20-25% c) ECHO (09/2013) EF 45-50%, grade I DD  . Complication of anesthesia    PT STATES HER B/P LOW AFTER ONE OF HER SURGERIES--SHE ATTRIBUTES TO LYING FLAT  . Depression   . Diabetes mellitus    "diet controlled" (05/04/2103)  . Exertional shortness of breath   .  GERD (gastroesophageal reflux disease)   . Gout   . Heart murmur   . Hyperlipidemia   . Hypertension   . Hypothyroidism   . Left bundle branch block    s/p CRT-D (04/2013)  . Migraines   . On home oxygen therapy    "2L suppose to be q night" (05/03/2013)  . Peripheral neuropathy    feet  . SVD (spontaneous vaginal delivery)    x 2  . Unspecified vitamin D deficiency 03/26/2011   does not take meds   Past Surgical History:  Procedure Laterality Date  . BI-VENTRICULAR IMPLANTABLE CARDIOVERTER DEFIBRILLATOR N/A 05/03/2013   Procedure: BI-VENTRICULAR IMPLANTABLE CARDIOVERTER DEFIBRILLATOR  (CRT-D);  Surgeon: Evans Lance, MD;  Location: [facility] CATH LAB;  Service: Cardiovascular;  Laterality: N/A;  . BI-VENTRICULAR IMPLANTABLE CARDIOVERTER DEFIBRILLATOR  (CRT-D)  05/03/2013   MDT CRTD implanted by Dr Lovena Le for non ischemic cardiomyopathy  . BIOPSY  05/31/2018   Procedure: BIOPSY;  Surgeon: Ronnette Juniper, MD;  Location: [facility];  Service: Gastroenterology;;  . BREAST LUMPECTOMY Left 07/28/2010  . CARDIAC CATHETERIZATION  2010   NORMAL CORONARY ARTERIES  . 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--11 SURGERIES     PT HAS REMOVABLE SPEECH BULB-TAKES IT OUT BEFORE HER SURGERY  . COLONOSCOPY    . COLONOSCOPY N/A 05/31/2018   Procedure: COLONOSCOPY;  Surgeon: Ronnette Juniper, MD;  Location: [facility];  Service: Gastroenterology;  Laterality: N/A;  . HYSTEROSCOPY W/D&C  01/20/2012   Procedure: DILATATION AND CURETTAGE /HYSTEROSCOPY;  Surgeon: Margarette Asal, MD;  Location: [facility];  Service: Gynecology;  Laterality: N/A;  with trueclear  . PORT-A-CATH REMOVAL  09/23/2011   Procedure: REMOVAL PORT-A-CATH;  Surgeon: Rolm Bookbinder, MD;  Location: [facility];  Service: General;  Laterality: N/A;  Port Removal  . PORTACATH PLACEMENT  2012  . TONSILLECTOMY  1960's  . TOTAL KNEE ARTHROPLASTY  05/10/2012   Procedure: TOTAL KNEE ARTHROPLASTY;  Surgeon: Mauri Pole, MD;  Location: [facility];  Service: Orthopedics;   Laterality: Right;  . WISDOM TOOTH EXTRACTION     Social History   Socioeconomic History  . Marital status: Married    Spouse name: Not on file  . Number of children: 2  . Years of education: masters  . Highest education level: Not on file  Occupational History  . Not on file  Social Needs  . Financial resource strain: Not on file  . Food insecurity:    Worry: Not on file    Inability: Not on file  . Transportation needs:    Medical: Not on file    Non-medical: Not on file  Tobacco Use  . Smoking status: Former Smoker    Packs/day: 0.10    Years: 26.00    Pack years: 2.60    Types: Cigarettes    Last attempt to quit: 05/06/2007    Years since quitting: 11.1  .  Smokeless tobacco: Never Used  Substance and Sexual Activity  . Alcohol use: No  . Drug use: No  . Sexual activity: Yes  Lifestyle  . Physical activity:    Days per week: Not on file    Minutes per session: Not on file  . Stress: Not on file  Relationships  . Social connections:    Talks on phone: Not on file    Gets together: Not on file    Attends religious service: Not on file    Active member of club or organization: Not on file    Attends meetings of clubs or organizations: Not on file    Relationship status: Not on file  . Intimate partner violence:    Fear of current or ex partner: Not on file    Emotionally abused: Not on file    Physically abused: Not on file    Forced sexual activity: Not on file  Other Topics Concern  . Not on file  Social History Narrative   Tobacco Use Cigarettes: Former Smoker, Quit in 2008   No Alcohol   No recreational drug use   Diet: Regular/Low Carb   Exercise: None   Occupation: disabled   Education: Research officer, political party, masters   Children: 2   Firearms: No   Therapist, art Use: Always   Former Metallurgist.       Current Outpatient Medications on File Prior to Visit  Medication Sig Dispense Refill  . acetaminophen (TYLENOL) 500 MG tablet Take 1,000-1,500 mg by mouth  every 6 (six) hours as needed (for pain.).    Marland Kitchen acetaZOLAMIDE (DIAMOX) 125 MG tablet TAKE 1 TABLET(125 MG) BY MOUTH DAILY (Patient taking differently: Take 125 mg by mouth daily. ) 30 tablet 5  . buPROPion (WELLBUTRIN XL) 150 MG 24 hr tablet Take 450 mg by mouth every morning.     . carboxymethylcellulose (REFRESH PLUS) 0.5 % SOLN Place 1 drop into both eyes 3 (three) times daily as needed (dry eyes).    . carvedilol (COREG) 3.125 MG tablet Take 3.125 mg by mouth 2 (two) times daily with a meal.    . clindamycin (CLEOCIN) 300 MG capsule Take 600 mg by mouth See admin instructions. Take 2 capsules (600 mg) by mouth 1 hour prior to dental procedure.    . clonazePAM (KLONOPIN) 0.5 MG tablet Take 0.5 mg by mouth 3 (three) times daily as needed for anxiety.     . colestipol (COLESTID) 1 g tablet Take 2 g by mouth 2 (two) times daily.     . fluticasone (FLONASE) 50 MCG/ACT nasal spray Place 2 sprays into both nostrils daily as needed for allergies.   7  . gabapentin (NEURONTIN) 300 MG capsule Take 300 mg by mouth 3 (three) times daily.     Marland Kitchen gabapentin (NEURONTIN) 800 MG tablet Take 800 mg by mouth at bedtime as needed (neuropathy).     Marland Kitchen ipratropium (ATROVENT) 0.03 % nasal spray Place 2 sprays into both nostrils every 12 (twelve) hours as needed for rhinitis.    Marland Kitchen levothyroxine (SYNTHROID, LEVOTHROID) 100 MCG tablet Take 1 tablet (100 mcg total) by mouth daily. 45 tablet 3  . metolazone (ZAROXOLYN) 2.5 MG tablet take 1 tablet by mouth if needed for FLUID OR EDEMA (Patient taking differently: Take 2.5 mg by mouth daily as needed (fluid retention/edema (weight gain of greater than 6 lbs)). ) 30 tablet 2  . midodrine (PROAMATINE) 10 MG tablet Take 1 tablet (10 mg total) by mouth 3 (  three) times daily with meals. 90 tablet 3  . midodrine (PROAMATINE) 5 MG tablet Take 5 mg by mouth 3 (three) times daily with meals.    . montelukast (SINGULAIR) 10 MG tablet Take 10 mg by mouth at bedtime.   1  . Multiple  Vitamin (MULTIVITAMIN WITH MINERALS) TABS tablet Take 1 tablet by mouth daily. One-A-Day 50+    . polyethylene glycol (MIRALAX / GLYCOLAX) packet Take 17 g by mouth daily as needed for moderate constipation.    . potassium chloride SA (K-DUR,KLOR-CON) 20 MEQ tablet Take 3 tablets (60 mEq total) by mouth 2 (two) times daily. 180 tablet 11  . simvastatin (ZOCOR) 10 MG tablet Take 10 mg by mouth every evening.     Marland Kitchen spironolactone (ALDACTONE) 25 MG tablet Take 0.5 tablets (12.5 mg total) by mouth daily. (Patient taking differently: Take 12.5 mg by mouth every evening. ) 45 tablet 3  . torsemide (DEMADEX) 20 MG tablet Take 3 tablets (60 mg total) by mouth daily. (Patient taking differently: Take 60 mg by mouth 2 (two) times daily. ) 180 tablet 3  . zolpidem (AMBIEN) 10 MG tablet Take 10 mg by mouth at bedtime as needed for sleep. For sleep      No current facility-administered medications on file prior to visit.    Allergies  Allergen Reactions  . Ceftin Anaphylaxis    Face and throat swell   . Shellfish Allergy Other (See Comments)    Gout exacerbation  . Allopurinol Nausea Only and Other (See Comments)    weakness  . Ativan [Lorazepam] Itching  . Sulfa Antibiotics Itching  . Ultram [Tramadol Hcl] Itching   Family History  Problem Relation Age of Onset  . Alzheimer's disease Mother   . CVA Mother   . Hypertension Mother   . Cancer - Colon Father        late 46  . Birth defects Paternal Uncle     PE: BP 110/60   Pulse 72   Ht 5' (1.524 m)   Wt 185 lb (83.9 kg)   SpO2 98%   BMI 36.13 kg/m  Wt Readings from Last 3 Encounters:  06/09/18 185 lb (83.9 kg)  06/02/18 183 lb 9.6 oz (83.3 kg)  05/31/18 181 lb 10.5 oz (82.4 kg)   Constitutional: overweight, in NAD Eyes: PERRLA, EOMI, no exophthalmos ENT: moist mucous membranes, no thyromegaly, no cervical lymphadenopathy Cardiovascular: RRR, No MRG Respiratory: CTA B Gastrointestinal: abdomen soft, NT, ND, BS+ Musculoskeletal: no  deformities, strength intact in all 4 Skin: moist, warm, no rashes Neurological: no tremor with outstretched hands, DTR normal in all 4  ASSESSMENT: 1. Hypothyroidism  2. Eucalcemic hyperparathyroidism -At last visit, we also addressed her hyperparathyroidism.  Her vitamin D level was normal, calcium was low normal and PTH high.  She also has CKD and we discussed about seeing the nephrologist as her hyperparathyroidism appears to be secondary to her CKD.  PLAN:  1. Patient with longstanding hypothyroidism on levothyroxine therapy.  At last visit she was not taking the levothyroxine correctly.  She was taking it at night, after dinner.  She was sometimes taking it an hour after dinner but sometimes she was skipping the medication completely if she fell asleep during dinner.  Her dinner is usually after 10 PM.  At last visit we discussed about the importance of taking levothyroxine on an empty stomach and I advised her to move this to morning.  As she was taking the multivitamins in the morning  I advised her to moved these at night.  At this visit, neither her or her son knew exactly how she is taking any of her medicines.  We called the person who administers her medicines and apparently she is still taking levothyroxine at night, multivitamins in the morning and also colestipol in the morning.  They will move the medications as advised, levothyroxine the morning, multivitamins at night and colestipol at least 4 hours after levothyroxine.  I again gave him verbal and written instructions about taking the thyroid hormone every day, with water, >30 minutes before breakfast, separated by >4 hours from acid reflux medications, calcium, iron, multivitamins. - latest thyroid labs reviewed with pt >> normal 02/10/2018  - she continues on LT4 100 mcg daily - pt feels good on this dose. - will check thyroid tests in 1.5 months after they make the recommended changes in dosing of levothyroxine: TSH and fT4 -I  will see her back in 6 months.  Patient Instructions  Please continue Levothyroxine 100 mcg in am.  Take the thyroid hormone every day, with water, at least 30 minutes before breakfast, separated by at least 4 hours from: - acid reflux medications - calcium - iron - multivitamins  Please move Levothyroxine to first thing in am.  Make sure you take Colestipol at least 4h later.  Move multivitamin at night.  Please come back for labs 1.5 months after this change.  Please come back for a follow-up appointment in 6 months.  - time spent with the patient: 25 minutes, of which >50% was spent in obtaining information about her symptoms, reviewing her previous labs, evaluations, and treatments, counseling her about her condition (please see the discussed topics above), and developing a plan to further investigate and treat it; she had a number of questions which I addressed.  Orders Placed This Encounter  Procedures  . TSH  . T4, free   Philemon Kingdom, MD PhD [facility] Endocrinology

## 2018-06-10 ENCOUNTER — Other Ambulatory Visit (HOSPITAL_COMMUNITY): Payer: Self-pay

## 2018-06-10 ENCOUNTER — Other Ambulatory Visit: Payer: Self-pay | Admitting: Internal Medicine

## 2018-06-10 ENCOUNTER — Telehealth (HOSPITAL_COMMUNITY): Payer: Self-pay | Admitting: Cardiology

## 2018-06-10 LAB — CUP PACEART REMOTE DEVICE CHECK
Battery Remaining Longevity: 23 mo
Battery Voltage: 2.92 V
Brady Statistic AP VP Percent: 0.05 %
Brady Statistic AP VS Percent: 0.01 %
Brady Statistic AS VP Percent: 99.92 %
Brady Statistic AS VS Percent: 0.02 %
Brady Statistic RA Percent Paced: 0.06 %
Brady Statistic RV Percent Paced: 99.96 %
Date Time Interrogation Session: 20191125083425
HighPow Impedance: 62 Ohm
Implantable Lead Implant Date: 20141210
Implantable Lead Implant Date: 20141210
Implantable Lead Implant Date: 20141210
Implantable Lead Location: 753858
Implantable Lead Location: 753859
Implantable Lead Location: 753860
Implantable Lead Model: 4396
Implantable Lead Model: 5076
Implantable Lead Model: 6935
Implantable Pulse Generator Implant Date: 20141210
Lead Channel Impedance Value: 323 Ohm
Lead Channel Impedance Value: 323 Ohm
Lead Channel Impedance Value: 456 Ohm
Lead Channel Impedance Value: 513 Ohm
Lead Channel Impedance Value: 532 Ohm
Lead Channel Impedance Value: 570 Ohm
Lead Channel Pacing Threshold Amplitude: 0.5 V
Lead Channel Pacing Threshold Amplitude: 0.75 V
Lead Channel Pacing Threshold Amplitude: 1.25 V
Lead Channel Pacing Threshold Pulse Width: 0.4 ms
Lead Channel Pacing Threshold Pulse Width: 0.4 ms
Lead Channel Pacing Threshold Pulse Width: 0.4 ms
Lead Channel Sensing Intrinsic Amplitude: 2.375 mV
Lead Channel Sensing Intrinsic Amplitude: 2.375 mV
Lead Channel Sensing Intrinsic Amplitude: 21.375 mV
Lead Channel Sensing Intrinsic Amplitude: 21.375 mV
Lead Channel Setting Pacing Amplitude: 1.75 V
Lead Channel Setting Pacing Amplitude: 2.5 V
Lead Channel Setting Pacing Amplitude: 2.5 V
Lead Channel Setting Pacing Pulse Width: 0.4 ms
Lead Channel Setting Pacing Pulse Width: 0.4 ms
Lead Channel Setting Sensing Sensitivity: 0.3 mV

## 2018-06-10 NOTE — Progress Notes (Signed)
Paramedicine Encounter    Patient ID: Rhonda [name], female    DOB: Feb 23, 1951, 68 y.o.   MRN: [id number]    Patient Care Team: Maurice Small, MD as PCP - General (Family Medicine) [name], LCSW as Social Worker (Licensed Clinical Social Worker)  Patient Active Problem List   Diagnosis Date Noted  . Orthostatic hypotension 07/28/2017  . SVD (spontaneous vaginal delivery)   . Peripheral neuropathy   . On home oxygen therapy   . Migraines   . Left bundle branch block   . Hypothyroidism   . Hypertension   . Hyperlipidemia   . Heart murmur   . GERD (gastroesophageal reflux disease)   . Exertional shortness of breath   . Depression   . Complication of anesthesia   . Back pain   . Arthritis   . Anxiety   . Anemia   . HTN (hypertension) 09/14/2013  . Chronic respiratory failure ([place]) 09/14/2013  . 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 Morbid obesity ([place]) 11/01/2012  . Chronic systolic heart failure ([place]) 10/27/2012  . S/P right TKA 05/10/2012  . Endometrial polyp 01/20/2012  . Breast cancer, stage 1 ([place]) 03/26/2011  . Unspecified vitamin D deficiency 03/26/2011    Current Outpatient Medications:  .  acetaZOLAMIDE (DIAMOX) 125 MG tablet, TAKE 1 TABLET(125 MG) BY MOUTH DAILY (Patient taking differently: Take 125 mg by mouth daily. ), Disp: 30 tablet, Rfl: 5 .  buPROPion (WELLBUTRIN XL) 150 MG 24 hr tablet, Take 450 mg by mouth every morning. , Disp: , Rfl:  .  carvedilol (COREG) 3.125 MG tablet, Take 3.125 mg by mouth 2 (two) times daily with a meal., Disp: , Rfl:  .  clonazePAM (KLONOPIN) 0.5 MG tablet, Take 0.5 mg by mouth 3 (three) times daily as needed for anxiety. , Disp: , Rfl:  .  colestipol (COLESTID) 1 g tablet, Take 2 g by mouth 2 (two) times daily. , Disp: , Rfl:  .  gabapentin (NEURONTIN) 300 MG capsule, Take 300 mg by mouth 3 (three) times daily. , Disp: , Rfl:  .  gabapentin (NEURONTIN) 800 MG tablet, Take 800 mg  by mouth at bedtime as needed (neuropathy). , Disp: , Rfl:  .  midodrine (PROAMATINE) 10 MG tablet, Take 1 tablet (10 mg total) by mouth 3 (three) times daily with meals., Disp: 90 tablet, Rfl: 3 .  montelukast (SINGULAIR) 10 MG tablet, Take 10 mg by mouth at bedtime. , Disp: , Rfl: 1 .  Multiple Vitamin (MULTIVITAMIN WITH MINERALS) TABS tablet, Take 1 tablet by mouth daily. One-A-Day 50+, Disp: , Rfl:  .  simvastatin (ZOCOR) 10 MG tablet, Take 10 mg by mouth every evening. , Disp: , Rfl:  .  spironolactone (ALDACTONE) 25 MG tablet, Take 0.5 tablets (12.5 mg total) by mouth daily. (Patient taking differently: Take 12.5 mg by mouth every evening. ), Disp: 45 tablet, Rfl: 3 .  torsemide (DEMADEX) 20 MG tablet, Take 3 tablets (60 mg total) by mouth daily. (Patient taking differently: Take 60 mg by mouth 2 (two) times daily. ), Disp: 180 tablet, Rfl: 3 .  zolpidem (AMBIEN) 10 MG tablet, Take 10 mg by mouth at bedtime as needed for sleep. For sleep , Disp: , Rfl:  .  acetaminophen (TYLENOL) 500 MG tablet, Take 1,000-1,500 mg by mouth every 6 (six) hours as needed (for pain.)., Disp: , Rfl:  .  carboxymethylcellulose (REFRESH PLUS) 0.5 % SOLN, Place 1 drop into both eyes 3 (three) times  daily as needed (dry eyes)., Disp: , Rfl:  .  citalopram (CELEXA) 20 MG tablet, Take 20 mg by mouth daily., Disp: , Rfl:  .  clindamycin (CLEOCIN) 300 MG capsule, Take 600 mg by mouth See admin instructions. Take 2 capsules (600 mg) by mouth 1 hour prior to dental procedure., Disp: , Rfl:  .  fexofenadine (ALLEGRA) 180 MG tablet, Take 180 mg by mouth 2 (two) times daily., Disp: , Rfl:  .  fluticasone (FLONASE) 50 MCG/ACT nasal spray, Place 2 sprays into both nostrils daily as needed for allergies. , Disp: , Rfl: 7 .  ipratropium (ATROVENT) 0.03 % nasal spray, Place 2 sprays into both nostrils every 12 (twelve) hours as needed for rhinitis., Disp: , Rfl:  .  levothyroxine (SYNTHROID, LEVOTHROID) 100 MCG tablet, TAKE 1  TABLET(100 MCG) BY MOUTH DAILY, Disp: 45 tablet, Rfl: 3 .  metolazone (ZAROXOLYN) 2.5 MG tablet, take 1 tablet by mouth if needed for FLUID OR EDEMA (Patient taking differently: Take 2.5 mg by mouth daily as needed (fluid retention/edema (weight gain of greater than 6 lbs)). ), Disp: 30 tablet, Rfl: 2 .  midodrine (PROAMATINE) 5 MG tablet, Take 5 mg by mouth 3 (three) times daily with meals., Disp: , Rfl:  .  polyethylene glycol (MIRALAX / GLYCOLAX) packet, Take 17 g by mouth daily as needed for moderate constipation., Disp: , Rfl:  .  potassium chloride SA (K-DUR,KLOR-CON) 20 MEQ tablet, Take 3 tablets (60 mEq total) by mouth 2 (two) times daily., Disp: 180 tablet, Rfl: 11 Allergies  Allergen Reactions  . Ceftin Anaphylaxis    Face and throat swell   . Shellfish Allergy Other (See Comments)    Gout exacerbation  . Allopurinol Nausea Only and Other (See Comments)    weakness  . Ativan [Lorazepam] Itching  . Sulfa Antibiotics Itching  . Ultram [Tramadol Hcl] Itching     Social History   Socioeconomic History  . Marital status: Married    Spouse name: Not on file  . Number of children: 2  . Years of education: masters  . Highest education level: Not on file  Occupational History  . Not on file  Social Needs  . Financial resource strain: Not on file  . Food insecurity:    Worry: Not on file    Inability: Not on file  . Transportation needs:    Medical: Not on file    Non-medical: Not on file  Tobacco Use  . Smoking status: Former Smoker    Packs/day: 0.10    Years: 26.00    Pack years: 2.60    Types: Cigarettes    Last attempt to quit: 05/06/2007    Years since quitting: 11.1  . Smokeless tobacco: Never Used  Substance and Sexual Activity  . Alcohol use: No  . Drug use: No  . Sexual activity: Yes  Lifestyle  . Physical activity:    Days per week: Not on file    Minutes per session: Not on file  . Stress: Not on file  Relationships  . Social connections:    Talks  on phone: Not on file    Gets together: Not on file    Attends religious service: Not on file    Active member of club or organization: Not on file    Attends meetings of clubs or organizations: Not on file    Relationship status: Not on file  . Intimate partner violence:    Fear of current or ex partner: Not  on file    Emotionally abused: Not on file    Physically abused: Not on file    Forced sexual activity: Not on file  Other Topics Concern  . Not on file  Social History Narrative   Tobacco Use Cigarettes: Former Smoker, Quit in 2008   No Alcohol   No recreational drug use   Diet: Regular/Low Carb   Exercise: None   Occupation: disabled   Education: Research officer, political party, masters   Children: 2   Firearms: No   Therapist, art Use: Always   Former Metallurgist.        Physical Exam Pulmonary:     Effort: No respiratory distress.     Breath sounds: No wheezing or rales.  Abdominal:     General: There is no distension.  Musculoskeletal:     Right lower leg: No edema.     Left lower leg: No edema.  Skin:    General: Skin is warm and dry.         Future Appointments  Date Time Provider [place]  06/15/2018  1:45 PM Marzetta Board, [place] [facility]  06/27/2018  8:30 AM [facility] DEVICE REMOTES [facility]  06/30/2018 10:00 AM [facility] ECHO 1-BU[facility]  06/30/2018 11:00 AM Bensimhon, Shaune Pascal, MD [facility] None  07/18/2018  8:05 AM [facility] DEVICE REMOTES [facility]  07/27/2018  8:50 AM Narda Amber K, DO [facility] None  07/27/2018 10:00 AM [facility] LAB [facility] None  12/08/2018 10:15 AM Philemon Kingdom, MD [facility] None     BP 120/62 (BP Location: Right Arm, Patient Position: Sitting, Cuff Size: Large)   Pulse 79   Resp 16   Wt 180 lb (81.6 kg)   SpO2 99%   BMI 35.15 kg/m   Weight yesterday-181 Last visit weight-183  ATF pt CAO x4 standing in the kitchen with no complaints. Pt appears to be in good spirits  today. She stated that she had already taken levothyroxine earlier today around 830.  Pt understands when and how to take the medication at this point.  I placed **levothyroxine in am slot (blue box), which was placed on her toaster.  Pt denies chest pain, dizziness and sob. rx bottles verified and pill box refilled.  Pt's husband was made aware of the location of pt's meds.   **levothyroxine in am (blue box); take colestipol, calcium, iron, multivitamin and acid reflux medications about 4 hours later than levothyroxine**  Notes written on pts discharge paperwork.   Taking citalopram 20mg  daily; fexofenadine 180 mg BID  Medication ordered: actazolamide  Chinaza Rooke, EMT Paramedic [phone] 06/10/2018    ACTION: Home visit completed

## 2018-06-10 NOTE — Telephone Encounter (Signed)
Dee with [facility] requested medication reconciliation after patients home visit   Reports patient is taking  Allegra 180 BID Celexa 20 daily Colestipol 2 BID    Medication added to [facility]

## 2018-06-15 ENCOUNTER — Ambulatory Visit: Payer: Medicare HMO | Admitting: Podiatry

## 2018-06-16 ENCOUNTER — Other Ambulatory Visit (HOSPITAL_COMMUNITY): Payer: Self-pay

## 2018-06-16 ENCOUNTER — Telehealth (HOSPITAL_COMMUNITY): Payer: Self-pay

## 2018-06-16 DIAGNOSIS — S0093XA Contusion of unspecified part of head, initial encounter: Secondary | ICD-10-CM | POA: Diagnosis not present

## 2018-06-16 DIAGNOSIS — D259 Leiomyoma of uterus, unspecified: Secondary | ICD-10-CM | POA: Diagnosis not present

## 2018-06-16 DIAGNOSIS — S8001XA Contusion of right knee, initial encounter: Secondary | ICD-10-CM | POA: Diagnosis not present

## 2018-06-16 DIAGNOSIS — N6332 Unspecified lump in axillary tail of the left breast: Secondary | ICD-10-CM | POA: Diagnosis not present

## 2018-06-16 DIAGNOSIS — W1800XA Striking against unspecified object with subsequent fall, initial encounter: Secondary | ICD-10-CM | POA: Diagnosis not present

## 2018-06-16 NOTE — Progress Notes (Signed)
Paramedicine Encounter    Patient ID: Rhonda [name], female    DOB: [birth year], 68 y.o.   MRN: [id number]    Patient Care Team: Maurice Small, MD as PCP - General (Family Medicine) [name], LCSW as Social Worker (Licensed Clinical Social Worker)  Patient Active Problem List   Diagnosis Date Noted  . Orthostatic hypotension 07/28/2017  . SVD (spontaneous vaginal delivery)   . Peripheral neuropathy   . On home oxygen therapy   . Migraines   . Left bundle branch block   . Hypothyroidism   . Hypertension   . Hyperlipidemia   . Heart murmur   . GERD (gastroesophageal reflux disease)   . Exertional shortness of breath   . Depression   . Complication of anesthesia   . Back pain   . Arthritis   . Anxiety   . Anemia   . HTN (hypertension) 09/14/2013  . Chronic respiratory failure ([place]) 09/14/2013  . 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 Morbid obesity ([place]) 11/01/2012  . Chronic systolic heart failure ([place]) 10/27/2012  . S/P right TKA 05/10/2012  . Endometrial polyp 01/20/2012  . Breast cancer, stage 1 ([place]) 03/26/2011  . Unspecified vitamin D deficiency 03/26/2011    Current Outpatient Medications:  .  acetaZOLAMIDE (DIAMOX) 125 MG tablet, TAKE 1 TABLET(125 MG) BY MOUTH DAILY (Patient taking differently: Take 125 mg by mouth daily. ), Disp: 30 tablet, Rfl: 5 .  buPROPion (WELLBUTRIN XL) 150 MG 24 hr tablet, Take 450 mg by mouth every morning. , Disp: , Rfl:  .  carvedilol (COREG) 3.125 MG tablet, Take 3.125 mg by mouth 2 (two) times daily with a meal., Disp: , Rfl:  .  citalopram (CELEXA) 20 MG tablet, Take 20 mg by mouth daily., Disp: , Rfl:  .  clonazePAM (KLONOPIN) 0.5 MG tablet, Take 0.5 mg by mouth 3 (three) times daily as needed for anxiety. , Disp: , Rfl:  .  colestipol (COLESTID) 1 g tablet, Take 2 g by mouth 2 (two) times daily. , Disp: , Rfl:  .  fexofenadine (ALLEGRA) 180 MG tablet, Take 180 mg by mouth 2 (two) times  daily., Disp: , Rfl:  .  gabapentin (NEURONTIN) 300 MG capsule, Take 300 mg by mouth 3 (three) times daily. , Disp: , Rfl:  .  levothyroxine (SYNTHROID, LEVOTHROID) 100 MCG tablet, TAKE 1 TABLET(100 MCG) BY MOUTH DAILY, Disp: 45 tablet, Rfl: 3 .  midodrine (PROAMATINE) 10 MG tablet, Take 1 tablet (10 mg total) by mouth 3 (three) times daily with meals., Disp: 90 tablet, Rfl: 3 .  montelukast (SINGULAIR) 10 MG tablet, Take 10 mg by mouth at bedtime. , Disp: , Rfl: 1 .  Multiple Vitamin (MULTIVITAMIN WITH MINERALS) TABS tablet, Take 1 tablet by mouth daily. One-A-Day 50+, Disp: , Rfl:  .  potassium chloride SA (K-DUR,KLOR-CON) 20 MEQ tablet, Take 3 tablets (60 mEq total) by mouth 2 (two) times daily., Disp: 180 tablet, Rfl: 11 .  simvastatin (ZOCOR) 10 MG tablet, Take 10 mg by mouth every evening. , Disp: , Rfl:  .  spironolactone (ALDACTONE) 25 MG tablet, Take 0.5 tablets (12.5 mg total) by mouth daily. (Patient taking differently: Take 12.5 mg by mouth every evening. ), Disp: 45 tablet, Rfl: 3 .  torsemide (DEMADEX) 20 MG tablet, Take 3 tablets (60 mg total) by mouth daily. (Patient taking differently: Take 60 mg by mouth 2 (two) times daily. ), Disp: 180 tablet, Rfl: 3 .  acetaminophen (TYLENOL) 500  MG tablet, Take 1,000-1,500 mg by mouth every 6 (six) hours as needed (for pain.)., Disp: , Rfl:  .  carboxymethylcellulose (REFRESH PLUS) 0.5 % SOLN, Place 1 drop into both eyes 3 (three) times daily as needed (dry eyes)., Disp: , Rfl:  .  clindamycin (CLEOCIN) 300 MG capsule, Take 600 mg by mouth See admin instructions. Take 2 capsules (600 mg) by mouth 1 hour prior to dental procedure., Disp: , Rfl:  .  fluticasone (FLONASE) 50 MCG/ACT nasal spray, Place 2 sprays into both nostrils daily as needed for allergies. , Disp: , Rfl: 7 .  gabapentin (NEURONTIN) 800 MG tablet, Take 800 mg by mouth at bedtime as needed (neuropathy). , Disp: , Rfl:  .  ipratropium (ATROVENT) 0.03 % nasal spray, Place 2 sprays  into both nostrils every 12 (twelve) hours as needed for rhinitis., Disp: , Rfl:  .  metolazone (ZAROXOLYN) 2.5 MG tablet, take 1 tablet by mouth if needed for FLUID OR EDEMA (Patient taking differently: Take 2.5 mg by mouth daily as needed (fluid retention/edema (weight gain of greater than 6 lbs)). ), Disp: 30 tablet, Rfl: 2 .  midodrine (PROAMATINE) 5 MG tablet, Take 5 mg by mouth 3 (three) times daily with meals., Disp: , Rfl:  .  polyethylene glycol (MIRALAX / GLYCOLAX) packet, Take 17 g by mouth daily as needed for moderate constipation., Disp: , Rfl:  .  zolpidem (AMBIEN) 10 MG tablet, Take 10 mg by mouth at bedtime as needed for sleep. For sleep , Disp: , Rfl:  Allergies  Allergen Reactions  . Ceftin Anaphylaxis    Face and throat swell   . Shellfish Allergy Other (See Comments)    Gout exacerbation  . Allopurinol Nausea Only and Other (See Comments)    weakness  . Ativan [Lorazepam] Itching  . Sulfa Antibiotics Itching  . Ultram [Tramadol Hcl] Itching     Social History   Socioeconomic History  . Marital status: Married    Spouse name: Not on file  . Number of children: 2  . Years of education: masters  . Highest education level: Not on file  Occupational History  . Not on file  Social Needs  . Financial resource strain: Not on file  . Food insecurity:    Worry: Not on file    Inability: Not on file  . Transportation needs:    Medical: Not on file    Non-medical: Not on file  Tobacco Use  . Smoking status: Former Smoker    Packs/day: 0.10    Years: 26.00    Pack years: 2.60    Types: Cigarettes    Last attempt to quit: 05/06/2007    Years since quitting: 11.1  . Smokeless tobacco: Never Used  Substance and Sexual Activity  . Alcohol use: No  . Drug use: No  . Sexual activity: Yes  Lifestyle  . Physical activity:    Days per week: Not on file    Minutes per session: Not on file  . Stress: Not on file  Relationships  . Social connections:    Talks on  phone: Not on file    Gets together: Not on file    Attends religious service: Not on file    Active member of club or organization: Not on file    Attends meetings of clubs or organizations: Not on file    Relationship status: Not on file  . Intimate partner violence:    Fear of current or ex partner: Not  on file    Emotionally abused: Not on file    Physically abused: Not on file    Forced sexual activity: Not on file  Other Topics Concern  . Not on file  Social History Narrative   Tobacco Use Cigarettes: Former Smoker, Quit in 2008   No Alcohol   No recreational drug use   Diet: Regular/Low Carb   Exercise: None   Occupation: disabled   Education: Research officer, political party, masters   Children: 2   Firearms: No   Therapist, art Use: Always   Former Metallurgist.        Physical Exam      Future Appointments  Date Time Provider [place]  06/27/2018  8:30 AM [facility] DEVICE REMOTES [facility]  06/30/2018 10:00 AM [facility] ECHO 1-BUZZ [facility]  06/30/2018 11:00 AM Bensimhon, Shaune Pascal, MD [facility] None  07/06/2018  1:15 PM Marzetta Board, DPM [facility]  07/18/2018  8:05 AM [facility] DEVICE REMOTES [facility]  07/27/2018  8:50 AM [name], Arvin Collard K, DO [facility] None  07/27/2018 10:00 AM [facility] LAB [facility] None  12/08/2018 10:15 AM Philemon Kingdom, MD [facility] None     Pulse 76   Wt 181 lb (82.1 kg)   BMI 35.35 kg/m   Weight yesterday-181 Last visit weight-181  ATF pt CAO x4 standing in the kitchen, she just came home from a doctors appointment and she has another one in 45 mins with her pcp. She stated that she fell yesterday in the shower and hurt the right knee. She wanted to get evaluation because she had knee replacement in the knee.  She denies sob, chest pain and dizziness.  Pt stated that she didn't take her medication today besides the medications that her physicartist prescribed her.  Pt's pill box refilled with the  levothyroxin placed in the blue pill box.    I sat aside her afternoon medications for when she returns home.   Medication ordered: clonazapam filed until [place], EMT Paramedic [phone] 06/16/2018    ACTION: Home visit completed

## 2018-06-16 NOTE — Telephone Encounter (Signed)
I called pt to confirm today's [facility] visit. No answer, so I left a message.

## 2018-06-16 NOTE — Telephone Encounter (Signed)
Pt called to confirm today's [facility] visit. No answer, I will still go by pt's house because she should be out of her medications today.

## 2018-06-21 DIAGNOSIS — N3000 Acute cystitis without hematuria: Secondary | ICD-10-CM | POA: Diagnosis not present

## 2018-06-21 DIAGNOSIS — F0632 Mood disorder due to known physiological condition with major depressive-like episode: Secondary | ICD-10-CM | POA: Diagnosis not present

## 2018-06-21 DIAGNOSIS — F3289 Other specified depressive episodes: Secondary | ICD-10-CM | POA: Diagnosis not present

## 2018-06-23 ENCOUNTER — Other Ambulatory Visit (HOSPITAL_COMMUNITY): Payer: Self-pay

## 2018-06-23 ENCOUNTER — Telehealth (HOSPITAL_COMMUNITY): Payer: Self-pay

## 2018-06-23 DIAGNOSIS — F0632 Mood disorder due to known physiological condition with major depressive-like episode: Secondary | ICD-10-CM | POA: Diagnosis not present

## 2018-06-23 DIAGNOSIS — F3289 Other specified depressive episodes: Secondary | ICD-10-CM | POA: Diagnosis not present

## 2018-06-23 NOTE — Progress Notes (Signed)
Paramedicine Encounter    Patient ID: Rhonda [name], female    DOB: March 16, 1951, 68 y.o.   MRN: [id number]    Patient Care Team: Maurice Small, MD as PCP - General (Family Medicine) [name], LCSW as Social Worker (Licensed Clinical Social Worker)  Patient Active Problem List   Diagnosis Date Noted  . Orthostatic hypotension 07/28/2017  . SVD (spontaneous vaginal delivery)   . Peripheral neuropathy   . On home oxygen therapy   . Migraines   . Left bundle branch block   . Hypothyroidism   . Hypertension   . Hyperlipidemia   . Heart murmur   . GERD (gastroesophageal reflux disease)   . Exertional shortness of breath   . Depression   . Complication of anesthesia   . Back pain   . Arthritis   . Anxiety   . Anemia   . HTN (hypertension) 09/14/2013  . Chronic respiratory failure ([place]) 09/14/2013  . 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 Morbid obesity ([place]) 11/01/2012  . Chronic systolic heart failure ([place]) 10/27/2012  . S/P right TKA 05/10/2012  . Endometrial polyp 01/20/2012  . Breast cancer, stage 1 ([place]) 03/26/2011  . Unspecified vitamin D deficiency 03/26/2011    Current Outpatient Medications:  .  acetaminophen (TYLENOL) 500 MG tablet, Take 1,000-1,500 mg by mouth every 6 (six) hours as needed (for pain.)., Disp: , Rfl:  .  acetaZOLAMIDE (DIAMOX) 125 MG tablet, TAKE 1 TABLET(125 MG) BY MOUTH DAILY (Patient taking differently: Take 125 mg by mouth daily. ), Disp: 30 tablet, Rfl: 5 .  buPROPion (WELLBUTRIN XL) 150 MG 24 hr tablet, Take 450 mg by mouth every morning. , Disp: , Rfl:  .  carboxymethylcellulose (REFRESH PLUS) 0.5 % SOLN, Place 1 drop into both eyes 3 (three) times daily as needed (dry eyes)., Disp: , Rfl:  .  carvedilol (COREG) 3.125 MG tablet, Take 3.125 mg by mouth 2 (two) times daily with a meal., Disp: , Rfl:  .  citalopram (CELEXA) 20 MG tablet, Take 20 mg by mouth daily., Disp: , Rfl:  .  clindamycin  (CLEOCIN) 300 MG capsule, Take 600 mg by mouth See admin instructions. Take 2 capsules (600 mg) by mouth 1 hour prior to dental procedure., Disp: , Rfl:  .  clonazePAM (KLONOPIN) 0.5 MG tablet, Take 0.5 mg by mouth 3 (three) times daily as needed for anxiety. , Disp: , Rfl:  .  colestipol (COLESTID) 1 g tablet, Take 2 g by mouth 2 (two) times daily. , Disp: , Rfl:  .  fexofenadine (ALLEGRA) 180 MG tablet, Take 180 mg by mouth 2 (two) times daily., Disp: , Rfl:  .  fluticasone (FLONASE) 50 MCG/ACT nasal spray, Place 2 sprays into both nostrils daily as needed for allergies. , Disp: , Rfl: 7 .  gabapentin (NEURONTIN) 300 MG capsule, Take 300 mg by mouth 3 (three) times daily. , Disp: , Rfl:  .  gabapentin (NEURONTIN) 800 MG tablet, Take 800 mg by mouth at bedtime as needed (neuropathy). , Disp: , Rfl:  .  ipratropium (ATROVENT) 0.03 % nasal spray, Place 2 sprays into both nostrils every 12 (twelve) hours as needed for rhinitis., Disp: , Rfl:  .  levothyroxine (SYNTHROID, LEVOTHROID) 100 MCG tablet, TAKE 1 TABLET(100 MCG) BY MOUTH DAILY, Disp: 45 tablet, Rfl: 3 .  metolazone (ZAROXOLYN) 2.5 MG tablet, take 1 tablet by mouth if needed for FLUID OR EDEMA (Patient taking differently: Take 2.5 mg by mouth daily as  needed (fluid retention/edema (weight gain of greater than 6 lbs)). ), Disp: 30 tablet, Rfl: 2 .  midodrine (PROAMATINE) 10 MG tablet, Take 1 tablet (10 mg total) by mouth 3 (three) times daily with meals., Disp: 90 tablet, Rfl: 3 .  midodrine (PROAMATINE) 5 MG tablet, Take 5 mg by mouth 3 (three) times daily with meals., Disp: , Rfl:  .  montelukast (SINGULAIR) 10 MG tablet, Take 10 mg by mouth at bedtime. , Disp: , Rfl: 1 .  Multiple Vitamin (MULTIVITAMIN WITH MINERALS) TABS tablet, Take 1 tablet by mouth daily. One-A-Day 50+, Disp: , Rfl:  .  polyethylene glycol (MIRALAX / GLYCOLAX) packet, Take 17 g by mouth daily as needed for moderate constipation., Disp: , Rfl:  .  potassium chloride SA  (K-DUR,KLOR-CON) 20 MEQ tablet, Take 3 tablets (60 mEq total) by mouth 2 (two) times daily., Disp: 180 tablet, Rfl: 11 .  simvastatin (ZOCOR) 10 MG tablet, Take 10 mg by mouth every evening. , Disp: , Rfl:  .  spironolactone (ALDACTONE) 25 MG tablet, Take 0.5 tablets (12.5 mg total) by mouth daily. (Patient taking differently: Take 12.5 mg by mouth every evening. ), Disp: 45 tablet, Rfl: 3 .  torsemide (DEMADEX) 20 MG tablet, Take 3 tablets (60 mg total) by mouth daily. (Patient taking differently: Take 60 mg by mouth 2 (two) times daily. ), Disp: 180 tablet, Rfl: 3 .  zolpidem (AMBIEN) 10 MG tablet, Take 10 mg by mouth at bedtime as needed for sleep. For sleep , Disp: , Rfl:  Allergies  Allergen Reactions  . Ceftin Anaphylaxis    Face and throat swell   . Shellfish Allergy Other (See Comments)    Gout exacerbation  . Allopurinol Nausea Only and Other (See Comments)    weakness  . Ativan [Lorazepam] Itching  . Sulfa Antibiotics Itching  . Ultram [Tramadol Hcl] Itching     Social History   Socioeconomic History  . Marital status: Married    Spouse name: Not on file  . Number of children: 2  . Years of education: masters  . Highest education level: Not on file  Occupational History  . Not on file  Social Needs  . Financial resource strain: Not on file  . Food insecurity:    Worry: Not on file    Inability: Not on file  . Transportation needs:    Medical: Not on file    Non-medical: Not on file  Tobacco Use  . Smoking status: Former Smoker    Packs/day: 0.10    Years: 26.00    Pack years: 2.60    Types: Cigarettes    Last attempt to quit: 05/06/2007    Years since quitting: 11.1  . Smokeless tobacco: Never Used  Substance and Sexual Activity  . Alcohol use: No  . Drug use: No  . Sexual activity: Yes  Lifestyle  . Physical activity:    Days per week: Not on file    Minutes per session: Not on file  . Stress: Not on file  Relationships  . Social connections:     Talks on phone: Not on file    Gets together: Not on file    Attends religious service: Not on file    Active member of club or organization: Not on file    Attends meetings of clubs or organizations: Not on file    Relationship status: Not on file  . Intimate partner violence:    Fear of current or ex partner: Not  on file    Emotionally abused: Not on file    Physically abused: Not on file    Forced sexual activity: Not on file  Other Topics Concern  . Not on file  Social History Narrative   Tobacco Use Cigarettes: Former Smoker, Quit in 2008   No Alcohol   No recreational drug use   Diet: Regular/Low Carb   Exercise: None   Occupation: disabled   Education: Research officer, political party, masters   Children: 2   Firearms: No   Therapist, art Use: Always   Former Metallurgist.        Physical Exam Pulmonary:     Effort: Pulmonary effort is normal. No respiratory distress.     Breath sounds: No wheezing.  Abdominal:     Tenderness: There is no abdominal tenderness.  Musculoskeletal:     Right lower leg: Edema present.     Left lower leg: Edema present.  Skin:    General: Skin is warm and dry.         Future Appointments  Date Time Provider [place]  06/27/2018  8:30 AM [facility] DEVICE REMOTES [facility]  06/30/2018 10:00 AM MC ECHO 1-BUZZ [facility]  06/30/2018 11:00 AM Bensimhon, Shaune Pascal, MD [facility] None  07/06/2018  1:15 PM Marzetta Board, DPM [facility]  07/18/2018  8:05 AM [facility] DEVICE REMOTES [facility]  07/27/2018  8:50 AM [name], Arvin Collard K, DO [facility] None  07/27/2018 10:00 AM [facility] LAB [facility] None  12/08/2018 10:15 AM Philemon Kingdom, MD [facility] None     There were no vitals taken for this visit.  Weight yesterday-didn't weigh Last visit weight-181  ATF pt CAO x4 sitting in the kitchen c/o feeling more depressed after leaving her psychiatrist office. She stated that she's thinking about changing her  psychiatrist.  Pt missed several medications throughout this week.  It appears that she took some pills out of the doses but left the others in the slots. She denies chest pain, dizziness and sob.  Pt stated that she didn't take her medications this morning because of her morning appointment which included her torsemide.  Therefore I gave her the afternoon meds, she didn't take them during our visit. She stated that she wanted to wait until she ate.  rx bottles verified and pill box refilled.     Medication ordered: Clonazepam  Maleke Feria, EMT Paramedic [phone] 06/23/2018    ACTION: Home visit completed

## 2018-06-23 NOTE — Telephone Encounter (Signed)
I called pt to confirm today's [facility] visit 

## 2018-06-27 ENCOUNTER — Ambulatory Visit (INDEPENDENT_AMBULATORY_CARE_PROVIDER_SITE_OTHER): Payer: Medicare HMO

## 2018-06-27 DIAGNOSIS — I5032 Chronic diastolic (congestive) heart failure: Secondary | ICD-10-CM | POA: Diagnosis not present

## 2018-06-27 DIAGNOSIS — Z95810 Presence of automatic (implantable) cardiac defibrillator: Secondary | ICD-10-CM | POA: Diagnosis not present

## 2018-06-27 NOTE — Progress Notes (Signed)
EPIC Encounter for ICM Monitoring  Patient Name: Rhonda [name] is a 68 y.o. female Date: 06/27/2018 Primary Care Physican: Maurice Small, MD Primary Cardiologist:Bensimhon Electrophysiologist: Santina Evans Pacing: 100% LastWeight:183lbs  Today's Weight:unknown                                                            Heart Failure questions reviewed, pt has been eating soup of the last couple of days due to nausea.  Patient crying and reported she was not having a good day because there are so many things wrong with her.  She has a therapist and encouraged to call the therapist today for support.     Thoracic impedance abnormal for the past 2 days and patient thinks it is because she is eating soup.    Prescribed: Torsemide 20 mg3tablets (60 mg total)once aday.Per11/6/2019HFoffice note,take an extra 60 mg PRN.Potassium 20 meq 3tablets (60 mEq total)twice a day. Metolazone 2.63m 1 tablet by mouth if needed for fluid or edema.  Labs: 03/30/2018 Creatinine 1.63, BUN 26, Potassium 3.9, Sodium 139, eGFR 32-37 02/10/2018 Creatinine 1.85, BUN 41, Potassium 3.9, Sodium 138, EGFR 28-33 10/29/2017 Creatinine 1.45, BUN 24, Potassium 3.9, Sodium 141, EGFR 37-43 10/21/2017 Creatinine 1.57, BUN 19, Potassium 3.8, Sodium 140, EGFR 34-39 09/23/2017 Creatinine 1.26, BUN 29, Potassium 3.2, Sodium 142, EGFR 49-56 09/09/2017 Creatinine 1.22, BUN 20, Potassium 3.4, Sodium 140, EGFR 46-53 08/25/2017 Creatinine 1.52, BUN 47, Potassium 3.1, Sodium 137, EGFR 35-41 07/28/2017 Creatinine 1.69, BUN 53, Potassium 3.0, Sodium 139, EGFR 31-36 (low potassium addressed by HF clinic) 06/21/2017 Creatinine 2.06, BUN 38, Potassium 2.5, Sodium 136, EGFR 24-28  Recommendations:  Encouraged her to find lower sodium options for soup and other types of foods that have less salt.  Follow-up plan: ICM clinic phone appointment on 08/01/2018.   Office appointment scheduled 06/30/2018 with Dr. BHaroldine Laws     Copy of ICM check sent to Dr. TLovena Leand Dr BHaroldine Laws  3 month ICM trend: 06/27/2018    1 Year ICM trend:       [name] RN 06/27/2018 3:03 PM

## 2018-06-30 ENCOUNTER — Other Ambulatory Visit (HOSPITAL_COMMUNITY): Payer: Self-pay

## 2018-06-30 ENCOUNTER — Encounter (HOSPITAL_COMMUNITY): Payer: Self-pay | Admitting: Internal Medicine

## 2018-06-30 ENCOUNTER — Ambulatory Visit (HOSPITAL_BASED_OUTPATIENT_CLINIC_OR_DEPARTMENT_OTHER)
Admission: RE | Admit: 2018-06-30 | Discharge: 2018-06-30 | Disposition: A | Discharge status: Home / Self Care | Payer: Medicare HMO | Source: Ambulatory Visit | Attending: Internal Medicine | Admitting: Internal Medicine

## 2018-06-30 ENCOUNTER — Ambulatory Visit (HOSPITAL_COMMUNITY)
Admission: RE | Admit: 2018-06-30 | Discharge: 2018-06-30 | Disposition: A | Discharge status: Home / Self Care | Payer: Medicare HMO | Source: Ambulatory Visit | Attending: Family Medicine | Admitting: Family Medicine

## 2018-06-30 VITALS — BP 132/82 | HR 86 | Wt 184.8 lb

## 2018-06-30 DIAGNOSIS — I5032 Chronic diastolic (congestive) heart failure: Secondary | ICD-10-CM

## 2018-06-30 DIAGNOSIS — E039 Hypothyroidism, unspecified: Secondary | ICD-10-CM | POA: Diagnosis not present

## 2018-06-30 DIAGNOSIS — M109 Gout, unspecified: Secondary | ICD-10-CM | POA: Diagnosis not present

## 2018-06-30 DIAGNOSIS — M549 Dorsalgia, unspecified: Secondary | ICD-10-CM | POA: Diagnosis not present

## 2018-06-30 DIAGNOSIS — K219 Gastro-esophageal reflux disease without esophagitis: Secondary | ICD-10-CM | POA: Insufficient documentation

## 2018-06-30 DIAGNOSIS — E1140 Type 2 diabetes mellitus with diabetic neuropathy, unspecified: Secondary | ICD-10-CM | POA: Diagnosis not present

## 2018-06-30 DIAGNOSIS — Z171 Estrogen receptor negative status [ER-]: Secondary | ICD-10-CM | POA: Diagnosis not present

## 2018-06-30 DIAGNOSIS — I5042 Chronic combined systolic (congestive) and diastolic (congestive) heart failure: Secondary | ICD-10-CM | POA: Diagnosis not present

## 2018-06-30 DIAGNOSIS — R011 Cardiac murmur, unspecified: Secondary | ICD-10-CM | POA: Diagnosis not present

## 2018-06-30 DIAGNOSIS — I951 Orthostatic hypotension: Secondary | ICD-10-CM | POA: Insufficient documentation

## 2018-06-30 DIAGNOSIS — I110 Hypertensive heart disease with heart failure: Secondary | ICD-10-CM | POA: Insufficient documentation

## 2018-06-30 DIAGNOSIS — Z79899 Other long term (current) drug therapy: Secondary | ICD-10-CM | POA: Diagnosis not present

## 2018-06-30 DIAGNOSIS — Z9981 Dependence on supplemental oxygen: Secondary | ICD-10-CM | POA: Diagnosis not present

## 2018-06-30 DIAGNOSIS — F329 Major depressive disorder, single episode, unspecified: Secondary | ICD-10-CM | POA: Insufficient documentation

## 2018-06-30 DIAGNOSIS — I5022 Chronic systolic (congestive) heart failure: Secondary | ICD-10-CM

## 2018-06-30 DIAGNOSIS — Z6836 Body mass index (BMI) 36.0-36.9, adult: Secondary | ICD-10-CM | POA: Diagnosis not present

## 2018-06-30 DIAGNOSIS — E785 Hyperlipidemia, unspecified: Secondary | ICD-10-CM | POA: Diagnosis not present

## 2018-06-30 DIAGNOSIS — J45909 Unspecified asthma, uncomplicated: Secondary | ICD-10-CM | POA: Diagnosis not present

## 2018-06-30 DIAGNOSIS — I428 Other cardiomyopathies: Secondary | ICD-10-CM | POA: Diagnosis not present

## 2018-06-30 DIAGNOSIS — J9610 Chronic respiratory failure, unspecified whether with hypoxia or hypercapnia: Secondary | ICD-10-CM | POA: Insufficient documentation

## 2018-06-30 DIAGNOSIS — F419 Anxiety disorder, unspecified: Secondary | ICD-10-CM | POA: Diagnosis not present

## 2018-06-30 DIAGNOSIS — Z7901 Long term (current) use of anticoagulants: Secondary | ICD-10-CM | POA: Insufficient documentation

## 2018-06-30 DIAGNOSIS — I447 Left bundle-branch block, unspecified: Secondary | ICD-10-CM | POA: Insufficient documentation

## 2018-06-30 LAB — BASIC METABOLIC PANEL
Anion gap: 14 (ref 5–15)
BUN: 27 mg/dL — ABNORMAL HIGH (ref 8–23)
CO2: 27 mmol/L (ref 22–32)
Calcium: 9.2 mg/dL (ref 8.9–10.3)
Chloride: 98 mmol/L (ref 98–111)
Creatinine, Ser: 1.75 mg/dL — ABNORMAL HIGH (ref 0.44–1.00)
GFR calc Af Amer: 35 mL/min — ABNORMAL LOW (ref >60)
GFR calc non Af Amer: 30 mL/min — ABNORMAL LOW (ref >60)
Glucose, Bld: 77 mg/dL (ref 70–99)
Potassium: 3 mmol/L — ABNORMAL LOW (ref 3.5–5.1)
Sodium: 139 mmol/L (ref 135–145)

## 2018-06-30 NOTE — Progress Notes (Signed)
ADVANCED HF CLINIC NOTE  Patient ID: Rhonda [name], female   DOB: [birth year], 68 y.o.   MRN: [id number]  Primary Cardiologist: Dr Radford Pax  General Surgeon: Dr Donne Hazel  Orthopedic: Dr Alvan Dame  PCP: Dr Justin Mend Referring MD: Dr Radford Pax  HPI: Rhonda [name] is a 68 y.o. 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 She has had sleep study with no  evidence of sleep apnea.  She has a Medtronic CRT-D device.  Last echo in 7/18 showed recovery of EF to 55-60%.   She has been following with Dr [name] In neurology and has been started on midodrine for orthostasis. Also started her on diamox for elevated ICP. W/u for TTR amyloid has been negative.  Followed by Paramedicine.   She is here with her husband and [place] from [employer]. She is doing well. Denies CP or SOB. Weight stable at 184. Taking torsemide 60 bid. No dizziness  Echo today EF 55-60% Personally reviewed   ICD interrogation: No VT/VF or AF. Fluid looks good. Activity 1hr.day 100% biv pacing Personally reviewed   10/14/12 ECHO EF 35% RV ok  04/12/13 ECHO EF 20-25%, LV moderately dilated 10/11/2013 ECHO EF 45-50% 7/18 ECHO EF 55-60%, normal RV size and systolic function, PASP 34 mmHg  11/24/12 Creatinine 1.23 Potassium 4.1 12/15/12 Creatinine 2.03 Potassium 3.3  12/22/12 Creatinine 1.2 Potassium 3.8 02/23/13 Creatinine 1.68 K 4.4  05/02/13 K 4.2 Creatinine 1.8  10/11/13 K 3.7 Creatinine 1.69 10/17 K 4.1, creatinine 1.35 09/2017: K3.8 Creatinine 1.57  Review of systems complete and found to be negative unless listed in HPI.   Past Medical History:  Diagnosis Date  . Anemia   . Anxiety   . Arthritis    RIGHT KNEE  ARTHRITIS AND PAIN-PT TOLD BONE ON BONE  . Asthma   . Back pain    DISK PROBLEM  . Breast cancer, stage 1 ([place]) 03/26/2011   left-FINISHED CHEMO  AND RADIATION  . Cardiomyopathy    [name]  . Chronic systolic heart failure ([facility])    a) NICM b) ECHO (03/2013) EF 20-25% c) ECHO (09/2013) EF 45-50%, grade I DD  . Complication of anesthesia    PT STATES HER B/P LOW AFTER ONE OF HER SURGERIES--SHE ATTRIBUTES TO LYING FLAT  . Depression   . Diabetes mellitus    "diet controlled" (05/04/2103)  . Exertional shortness of breath   . GERD (gastroesophageal reflux disease)   . Gout   . Heart murmur   . Hyperlipidemia   . Hypertension   . Hypothyroidism   . Left bundle branch block    s/p CRT-D (04/2013)  . Migraines   . On home oxygen therapy    "2L suppose to be q night" (05/03/2013)  . Peripheral neuropathy    feet  . SVD (spontaneous vaginal delivery)    x 2  . Unspecified vitamin D deficiency 03/26/2011   does not take meds    Current Outpatient Medications  Medication Sig Dispense Refill  . acetaminophen (TYLENOL) 500 MG tablet Take 1,000-1,500 mg by mouth every 6 (six) hours as needed (for pain.).    Marland Kitchen acetaZOLAMIDE (DIAMOX) 125 MG tablet TAKE 1 TABLET(125 MG)  BY MOUTH DAILY (Patient taking differently: Take 125 mg by mouth daily. ) 30 tablet 5  . buPROPion (WELLBUTRIN XL) 150 MG 24 hr tablet Take 450 mg by mouth every morning.     . carboxymethylcellulose (REFRESH PLUS) 0.5 % SOLN Place 1 drop into both eyes 3 (three) times daily as needed (dry eyes).    . carvedilol (COREG) 3.125 MG tablet Take 3.125 mg by mouth 2 (two) times daily with a meal.    . citalopram (CELEXA) 20 MG tablet Take 20 mg by mouth daily.    . clonazePAM (KLONOPIN) 0.5 MG tablet Take 0.5 mg by mouth 3 (three) times daily as needed for anxiety.     . colestipol (COLESTID) 1 g tablet Take 2 g by mouth 2 (two) times daily.     . fexofenadine (ALLEGRA) 180 MG tablet Take 180 mg by  mouth 2 (two) times daily.    . fluticasone (FLONASE) 50 MCG/ACT nasal spray Place 2 sprays into both nostrils daily as needed for allergies.   7  . gabapentin (NEURONTIN) 300 MG capsule Take 300 mg by mouth 3 (three) times daily.     Marland Kitchen gabapentin (NEURONTIN) 800 MG tablet Take 800 mg by mouth at bedtime as needed (neuropathy).     Marland Kitchen ibuprofen (ADVIL,MOTRIN) 800 MG tablet Take 800 mg by mouth 3 (three) times daily.    Marland Kitchen ipratropium (ATROVENT) 0.03 % nasal spray Place 2 sprays into both nostrils every 12 (twelve) hours as needed for rhinitis.    Marland Kitchen levothyroxine (SYNTHROID, LEVOTHROID) 100 MCG tablet TAKE 1 TABLET(100 MCG) BY MOUTH DAILY 45 tablet 3  . meloxicam (MOBIC) 15 MG tablet Take 15 mg by mouth daily. Daily with food for 10 days then PRN    . metolazone (ZAROXOLYN) 2.5 MG tablet take 1 tablet by mouth if needed for FLUID OR EDEMA (Patient taking differently: Take 2.5 mg by mouth daily as needed (fluid retention/edema (weight gain of greater than 6 lbs)). ) 30 tablet 2  . midodrine (PROAMATINE) 10 MG tablet Take 1 tablet (10 mg total) by mouth 3 (three) times daily with meals. 90 tablet 3  . montelukast (SINGULAIR) 10 MG tablet Take 10 mg by mouth at bedtime.   1  . Multiple Vitamin (MULTIVITAMIN WITH MINERALS) TABS tablet Take 1 tablet by mouth daily. One-A-Day 50+    . polyethylene glycol (MIRALAX / GLYCOLAX) packet Take 17 g by mouth daily as needed for moderate constipation.    . potassium chloride SA (K-DUR,KLOR-CON) 20 MEQ tablet Take 3 tablets (60 mEq total) by mouth 2 (two) times daily. 180 tablet 11  . simvastatin (ZOCOR) 10 MG tablet Take 10 mg by mouth every evening.     Marland Kitchen spironolactone (ALDACTONE) 25 MG tablet Take 0.5 tablets (12.5 mg total) by mouth daily. (Patient taking differently: Take 12.5 mg by mouth every evening. ) 45 tablet 3  . torsemide (DEMADEX) 20 MG tablet Take 3 tablets (60 mg total) by mouth daily. (Patient taking differently: Take 60 mg by mouth 2 (two) times  daily. ) 180 tablet 3  . venlafaxine (EFFEXOR) 37.5 MG tablet Take 37.5 mg by mouth daily. Daily for 7 days then stop    . zolpidem (AMBIEN) 10 MG tablet Take 10 mg by mouth at bedtime as needed for sleep. For sleep     . clindamycin (CLEOCIN) 300 MG capsule Take 600 mg by mouth See admin instructions. Take 2 capsules (600 mg) by mouth 1 hour prior to dental procedure.    Marland Kitchen  midodrine (PROAMATINE) 5 MG tablet Take 5 mg by mouth 3 (three) times daily with meals.     No current facility-administered medications for this encounter.     Vitals:   06/30/18 1113  BP: 132/82  Pulse: 86  SpO2: 95%  Weight: 83.8 kg (184 lb 12.8 oz)   Orthostatics: Sitting: 128/80 Standing: 110/78  Wt Readings from Last 3 Encounters:  06/30/18 83.8 kg (184 lb 12.8 oz)  06/16/18 82.1 kg (181 lb)  06/10/18 81.6 kg (180 lb)    PHYSICAL EXAM: General:  Well appearing. No resp difficulty HEENT: normal with cleft palate repair Neck: supple. no JVD. Carotids 2+ bilat; no bruits. No lymphadenopathy or thryomegaly appreciated. Cor: PMI nondisplaced. Regular rate & rhythm. No rubs, gallops or murmurs. Lungs: clear Abdomen: obese soft, nontender, nondistended. No hepatosplenomegaly. No bruits or masses. Good bowel sounds. Extremities: no cyanosis, clubbing, rash, trace edema Neuro: alert & orientedx3, cranial nerves grossly intact. moves all 4 extremities w/o difficulty. Affect pleasant    ASSESSMENT & PLAN:  1) Chronic systolic HF: NICM s/p Medtronic CRT-D, ?Viral myocarditis. Cardiomyopathy dates from 74. EF 45-50% (09/2013), EF up to 55-60% on 7/18 echo.  - Echo today (06/30/18) EF 55-60% Personally reviewed - PYP 09/02/17 negative for TTR (Grade 0-1, H/CCL 1.2). SPEP with no M-spike.  - Stable NYHA III. Volume status looks good on exam and by Optivol - Decrease torsemide to 60 mg BID  - Continue coreg 3.125 mg BID.  - Continue spiro 12.5 mg daily.  - Continue paramedicine.  - Encouraged her to be more  active - ICD interrogated personally.  - 2) Obesity - Body mass index is 36.09 kg/m.  - Encouraged weight loss.  3) HTN -Stable.  4) Orthostatic Hypotension.  - Neurology following. Gabapentin decreased. - Improved on midodrine 10 mg TID.  - Continue to follow with Dr. [name] in Neuro.  5) Chronic Venous stasis - Continue compression hose   Glori Bickers MD 11:34 AM

## 2018-06-30 NOTE — Progress Notes (Signed)
  Echocardiogram 2D Echocardiogram has been performed.  Rhonda [name] 06/30/2018, 12:28 PM

## 2018-06-30 NOTE — Patient Instructions (Signed)
Labs done today. We will contact you for any abnormal lab work.  Follow up with Dr. Haroldine Laws in 3 months

## 2018-06-30 NOTE — Progress Notes (Signed)
Paramedicine Encounter   Patient ID: Rhonda [name] , female,   DOB: [birth year],67 y.o.,  MRN: [id number]   Met patient in clinic today with provider Dr. Missy Sabins.   Pt is accompanied by her husband during this visit. Pt states that she feels ok, she's still concerned about her being depressed and her memory issues. She denies sob, chest pain and dizziness.  Pt's BP and weights has been stable, with very little changes.  rx bottles verified by RN during this visit. Pt's pill box completed during this clinic visit.  No medication changes during this visit unless blood work indicates otherwise. Pt's heart function is normal now @ 55-65%.    Time spent with patient 40 mins   [place], [place] 06/30/2018   ACTION: Home visit completed

## 2018-07-01 ENCOUNTER — Other Ambulatory Visit: Payer: Self-pay | Admitting: Neurology

## 2018-07-01 MED ORDER — MIDODRINE HCL 10 MG PO TABS: 10.0000 mg | ORAL_TABLET | Freq: Three times a day (TID) | ORAL | 3 refills | Status: DC

## 2018-07-06 ENCOUNTER — Ambulatory Visit: Payer: Medicare HMO | Admitting: Podiatry

## 2018-07-06 DIAGNOSIS — M79674 Pain in right toe(s): Secondary | ICD-10-CM

## 2018-07-06 DIAGNOSIS — M79675 Pain in left toe(s): Secondary | ICD-10-CM | POA: Diagnosis not present

## 2018-07-06 DIAGNOSIS — B351 Tinea unguium: Secondary | ICD-10-CM | POA: Diagnosis not present

## 2018-07-06 NOTE — Patient Instructions (Signed)

## 2018-07-08 ENCOUNTER — Other Ambulatory Visit (HOSPITAL_COMMUNITY): Payer: Self-pay

## 2018-07-08 NOTE — Progress Notes (Signed)
Paramedicine Encounter    Patient ID: Rhonda [name], female    DOB: 30-Mar-1951, 68 y.o.   MRN: [id number]    Patient Care Team: Maurice Small, MD as PCP - General (Family Medicine) [name], LCSW as Social Worker (Licensed Clinical Social Worker)  Patient Active Problem List   Diagnosis Date Noted  . Orthostatic hypotension 07/28/2017  . SVD (spontaneous vaginal delivery)   . Peripheral neuropathy   . On home oxygen therapy   . Migraines   . Left bundle branch block   . Hypothyroidism   . Hypertension   . Hyperlipidemia   . Heart murmur   . GERD (gastroesophageal reflux disease)   . Exertional shortness of breath   . Depression   . Complication of anesthesia   . Back pain   . Arthritis   . Anxiety   . Anemia   . HTN (hypertension) 09/14/2013  . Chronic respiratory failure ([place]) 09/14/2013  . 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 Morbid obesity ([place]) 11/01/2012  . Chronic systolic heart failure ([place]) 10/27/2012  . S/P right TKA 05/10/2012  . Endometrial polyp 01/20/2012  . Breast cancer, stage 1 ([place]) 03/26/2011  . Unspecified vitamin D deficiency 03/26/2011    Current Outpatient Medications:  .  acetaZOLAMIDE (DIAMOX) 125 MG tablet, TAKE 1 TABLET(125 MG) BY MOUTH DAILY (Patient taking differently: Take 125 mg by mouth daily. ), Disp: 30 tablet, Rfl: 5 .  buPROPion (WELLBUTRIN XL) 150 MG 24 hr tablet, Take 450 mg by mouth every morning. , Disp: , Rfl:  .  carvedilol (COREG) 3.125 MG tablet, Take 3.125 mg by mouth 2 (two) times daily with a meal., Disp: , Rfl:  .  citalopram (CELEXA) 20 MG tablet, Take 20 mg by mouth daily., Disp: , Rfl:  .  clonazePAM (KLONOPIN) 0.5 MG tablet, Take 0.5 mg by mouth 3 (three) times daily as needed for anxiety. , Disp: , Rfl:  .  fexofenadine (ALLEGRA) 180 MG tablet, Take 180 mg by mouth 2 (two) times daily., Disp: , Rfl:  .  gabapentin (NEURONTIN) 300 MG capsule, Take 300 mg by mouth 3  (three) times daily. , Disp: , Rfl:  .  levothyroxine (SYNTHROID, LEVOTHROID) 100 MCG tablet, TAKE 1 TABLET(100 MCG) BY MOUTH DAILY, Disp: 45 tablet, Rfl: 3 .  midodrine (PROAMATINE) 10 MG tablet, Take 1 tablet (10 mg total) by mouth 3 (three) times daily with meals., Disp: 90 tablet, Rfl: 3 .  montelukast (SINGULAIR) 10 MG tablet, Take 10 mg by mouth at bedtime. , Disp: , Rfl: 1 .  Multiple Vitamin (MULTIVITAMIN WITH MINERALS) TABS tablet, Take 1 tablet by mouth daily. One-A-Day 50+, Disp: , Rfl:  .  potassium chloride SA (K-DUR,KLOR-CON) 20 MEQ tablet, Take 3 tablets (60 mEq total) by mouth 2 (two) times daily., Disp: 180 tablet, Rfl: 11 .  simvastatin (ZOCOR) 10 MG tablet, Take 10 mg by mouth every evening. , Disp: , Rfl:  .  spironolactone (ALDACTONE) 25 MG tablet, Take 0.5 tablets (12.5 mg total) by mouth daily. (Patient taking differently: Take 12.5 mg by mouth every evening. ), Disp: 45 tablet, Rfl: 3 .  torsemide (DEMADEX) 20 MG tablet, Take 3 tablets (60 mg total) by mouth daily. (Patient taking differently: Take 60 mg by mouth 2 (two) times daily. ), Disp: 180 tablet, Rfl: 3 .  zolpidem (AMBIEN) 10 MG tablet, Take 10 mg by mouth at bedtime as needed for sleep. For sleep , Disp: , Rfl:  .  acetaminophen (TYLENOL) 500 MG tablet, Take 1,000-1,500 mg by mouth every 6 (six) hours as needed (for pain.)., Disp: , Rfl:  .  carboxymethylcellulose (REFRESH PLUS) 0.5 % SOLN, Place 1 drop into both eyes 3 (three) times daily as needed (dry eyes)., Disp: , Rfl:  .  clindamycin (CLEOCIN) 300 MG capsule, Take 600 mg by mouth See admin instructions. Take 2 capsules (600 mg) by mouth 1 hour prior to dental procedure., Disp: , Rfl:  .  colestipol (COLESTID) 1 g tablet, Take 2 g by mouth 2 (two) times daily. , Disp: , Rfl:  .  fluticasone (FLONASE) 50 MCG/ACT nasal spray, Place 2 sprays into both nostrils daily as needed for allergies. , Disp: , Rfl: 7 .  gabapentin (NEURONTIN) 800 MG tablet, Take 800 mg by  mouth at bedtime as needed (neuropathy). , Disp: , Rfl:  .  ibuprofen (ADVIL,MOTRIN) 800 MG tablet, Take 800 mg by mouth 3 (three) times daily., Disp: , Rfl:  .  ipratropium (ATROVENT) 0.03 % nasal spray, Place 2 sprays into both nostrils every 12 (twelve) hours as needed for rhinitis., Disp: , Rfl:  .  meloxicam (MOBIC) 15 MG tablet, Take 15 mg by mouth daily. Daily with food for 10 days then PRN, Disp: , Rfl:  .  metolazone (ZAROXOLYN) 2.5 MG tablet, take 1 tablet by mouth if needed for FLUID OR EDEMA (Patient taking differently: Take 2.5 mg by mouth daily as needed (fluid retention/edema (weight gain of greater than 6 lbs)). ), Disp: 30 tablet, Rfl: 2 .  polyethylene glycol (MIRALAX / GLYCOLAX) packet, Take 17 g by mouth daily as needed for moderate constipation., Disp: , Rfl:  .  venlafaxine (EFFEXOR) 37.5 MG tablet, Take 37.5 mg by mouth daily. Daily for 7 days then stop, Disp: , Rfl:  Allergies  Allergen Reactions  . Ceftin Anaphylaxis    Face and throat swell   . Shellfish Allergy Other (See Comments)    Gout exacerbation  . Allopurinol Nausea Only and Other (See Comments)    weakness  . Ativan [Lorazepam] Itching  . Sulfa Antibiotics Itching  . Ultram [Tramadol Hcl] Itching     Social History   Socioeconomic History  . Marital status: Married    Spouse name: Not on file  . Number of children: 2  . Years of education: masters  . Highest education level: Not on file  Occupational History  . Not on file  Social Needs  . Financial resource strain: Not on file  . Food insecurity:    Worry: Not on file    Inability: Not on file  . Transportation needs:    Medical: Not on file    Non-medical: Not on file  Tobacco Use  . Smoking status: Former Smoker    Packs/day: 0.10    Years: 26.00    Pack years: 2.60    Types: Cigarettes    Last attempt to quit: 05/06/2007    Years since quitting: 11.1  . Smokeless tobacco: Never Used  Substance and Sexual Activity  . Alcohol use:  No  . Drug use: No  . Sexual activity: Yes  Lifestyle  . Physical activity:    Days per week: Not on file    Minutes per session: Not on file  . Stress: Not on file  Relationships  . Social connections:    Talks on phone: Not on file    Gets together: Not on file    Attends religious service: Not on file  Active member of club or organization: Not on file    Attends meetings of clubs or organizations: Not on file    Relationship status: Not on file  . Intimate partner violence:    Fear of current or ex partner: Not on file    Emotionally abused: Not on file    Physically abused: Not on file    Forced sexual activity: Not on file  Other Topics Concern  . Not on file  Social History Narrative   Tobacco Use Cigarettes: Former Smoker, Quit in 2008   No Alcohol   No recreational drug use   Diet: Regular/Low Carb   Exercise: None   Occupation: disabled   Education: Research officer, political party, masters   Children: 2   Firearms: No   Therapist, art Use: Always   Former Metallurgist.        Physical Exam Abdominal:     General: There is distension.  Musculoskeletal:        General: No swelling.  Skin:    General: Skin is warm and dry.         Future Appointments  Date Time Provider [place]  07/18/2018  8:05 AM [facility]  07/27/2018  8:50 AM Narda Amber K, DO [facility] None  07/27/2018 10:00 AM [facility] None  08/01/2018  7:45 AM [facility]OT[facility]  09/28/2018 11:20 AM Bensimhon, Shaune Pascal, MD [facility] None  10/05/2018  1:15 PM Marzetta Board, DPM [facility]  12/08/2018 10:15 AM Philemon Kingdom, MD [facility] None     BP 110/70 (BP Location: Right Arm, Patient Position: Sitting, Cuff Size: Large)   Pulse 82   Resp 16   Wt 182 lb 1.6 oz (82.6 kg)   SpO2 95%   BMI 35.56 kg/m   Weight yesterday-182  ATF pt CAO x4 standing in the kitchen writing notes for herself.  She  stated that she didn't take a levothyroxine this am, but she took the other medications.  Pt has missed a couple of afternoon meds.  She denies sob, chest pain and dizziness. Pt has set a goal for herself to lose 12 lbs.  She stated that she is going to walk more and try to be more active.  She continues to watch what she's eating.  She appears to be in good spirits today. rx bottles verified and pill box refilled.    Medication ordered: fexofenadine filled until wed [place], EMT Paramedic [phone] 07/08/2018    ACTION: Home visit completed

## 2018-07-10 ENCOUNTER — Encounter: Payer: Self-pay | Admitting: Podiatry

## 2018-07-10 NOTE — Progress Notes (Signed)
Subjective: Rhonda [name] presents today with history of neuropathy with cc of painful, mycotic toenails.  Pain is aggravated when wearing enclosed shoe gear and relieved with periodic professional debridement.  Patient has peripheral neuropathy managed with gabapentin.  Maurice Small, MD is her PCP.   Current Outpatient Medications:  .  acetaminophen (TYLENOL) 500 MG tablet, Take 1,000-1,500 mg by mouth every 6 (six) hours as needed (for pain.)., Disp: , Rfl:  .  acetaZOLAMIDE (DIAMOX) 125 MG tablet, TAKE 1 TABLET(125 MG) BY MOUTH DAILY (Patient taking differently: Take 125 mg by mouth daily. ), Disp: 30 tablet, Rfl: 5 .  buPROPion (WELLBUTRIN XL) 150 MG 24 hr tablet, Take 450 mg by mouth every morning. , Disp: , Rfl:  .  carboxymethylcellulose (REFRESH PLUS) 0.5 % SOLN, Place 1 drop into both eyes 3 (three) times daily as needed (dry eyes)., Disp: , Rfl:  .  carvedilol (COREG) 3.125 MG tablet, Take 3.125 mg by mouth 2 (two) times daily with a meal., Disp: , Rfl:  .  citalopram (CELEXA) 20 MG tablet, Take 20 mg by mouth daily., Disp: , Rfl:  .  clindamycin (CLEOCIN) 300 MG capsule, Take 600 mg by mouth See admin instructions. Take 2 capsules (600 mg) by mouth 1 hour prior to dental procedure., Disp: , Rfl:  .  clonazePAM (KLONOPIN) 0.5 MG tablet, Take 0.5 mg by mouth 3 (three) times daily as needed for anxiety. , Disp: , Rfl:  .  colestipol (COLESTID) 1 g tablet, Take 2 g by mouth 2 (two) times daily. , Disp: , Rfl:  .  fexofenadine (ALLEGRA) 180 MG tablet, Take 180 mg by mouth 2 (two) times daily., Disp: , Rfl:  .  fluticasone (FLONASE) 50 MCG/ACT nasal spray, Place 2 sprays into both nostrils daily as needed for allergies. , Disp: , Rfl: 7 .  gabapentin (NEURONTIN) 300 MG capsule, Take 300 mg by mouth 3 (three) times daily. , Disp: , Rfl:  .  gabapentin (NEURONTIN) 800 MG tablet, Take 800 mg by mouth at bedtime as needed (neuropathy). , Disp: , Rfl:  .  ibuprofen (ADVIL,MOTRIN) 800 MG tablet,  Take 800 mg by mouth 3 (three) times daily., Disp: , Rfl:  .  ipratropium (ATROVENT) 0.03 % nasal spray, Place 2 sprays into both nostrils every 12 (twelve) hours as needed for rhinitis., Disp: , Rfl:  .  levothyroxine (SYNTHROID, LEVOTHROID) 100 MCG tablet, TAKE 1 TABLET(100 MCG) BY MOUTH DAILY, Disp: 45 tablet, Rfl: 3 .  meloxicam (MOBIC) 15 MG tablet, Take 15 mg by mouth daily. Daily with food for 10 days then PRN, Disp: , Rfl:  .  metolazone (ZAROXOLYN) 2.5 MG tablet, take 1 tablet by mouth if needed for FLUID OR EDEMA (Patient taking differently: Take 2.5 mg by mouth daily as needed (fluid retention/edema (weight gain of greater than 6 lbs)). ), Disp: 30 tablet, Rfl: 2 .  midodrine (PROAMATINE) 10 MG tablet, Take 1 tablet (10 mg total) by mouth 3 (three) times daily with meals., Disp: 90 tablet, Rfl: 3 .  montelukast (SINGULAIR) 10 MG tablet, Take 10 mg by mouth at bedtime. , Disp: , Rfl: 1 .  Multiple Vitamin (MULTIVITAMIN WITH MINERALS) TABS tablet, Take 1 tablet by mouth daily. One-A-Day 50+, Disp: , Rfl:  .  polyethylene glycol (MIRALAX / GLYCOLAX) packet, Take 17 g by mouth daily as needed for moderate constipation., Disp: , Rfl:  .  potassium chloride SA (K-DUR,KLOR-CON) 20 MEQ tablet, Take 3 tablets (60 mEq total) by mouth  2 (two) times daily., Disp: 180 tablet, Rfl: 11 .  simvastatin (ZOCOR) 10 MG tablet, Take 10 mg by mouth every evening. , Disp: , Rfl:  .  spironolactone (ALDACTONE) 25 MG tablet, Take 0.5 tablets (12.5 mg total) by mouth daily. (Patient taking differently: Take 12.5 mg by mouth every evening. ), Disp: 45 tablet, Rfl: 3 .  torsemide (DEMADEX) 20 MG tablet, Take 3 tablets (60 mg total) by mouth daily. (Patient taking differently: Take 60 mg by mouth 2 (two) times daily. ), Disp: 180 tablet, Rfl: 3 .  venlafaxine (EFFEXOR) 37.5 MG tablet, Take 37.5 mg by mouth daily. Daily for 7 days then stop, Disp: , Rfl:  .  zolpidem (AMBIEN) 10 MG tablet, Take 10 mg by mouth at bedtime  as needed for sleep. For sleep , Disp: , Rfl:   Allergies  Allergen Reactions  . Ceftin Anaphylaxis    Face and throat swell   . Shellfish Allergy Other (See Comments)    Gout exacerbation  . Allopurinol Nausea Only and Other (See Comments)    weakness  . Ativan [Lorazepam] Itching  . Sulfa Antibiotics Itching  . Ultram [Tramadol Hcl] Itching    Objective:  Vascular Examination: Capillary refill time immediate x 10 digits   Dorsalis pedis and Posterior tibial pulses 1/4 b/l  Digital hair x 10 digits was absent  Skin temperature gradient WNL b/l  Dermatological Examination: Skin with normal turgor, texture and tone b/l  Toenails 1-5 b/l discolored, thick, dystrophic with subungual debris and pain with palpation to nailbeds due to thickness of nails.  Musculoskeletal: Muscle strength 5/5 to all muscle groups b/l  Severe pes planus b/l  Decreased ankle joint ROM b/l  Neurological: Sensation with 10 gram monofilament is intact b/l  Assessment: 1. Painful onychomycosis toenails 1-5 b/l 2. NIDDM with neuropathy  Plan: 1. Toenails 1-5 b/l were debrided in length and girth without iatrogenic bleeding. 2. Patient to continue soft, supportive shoe gear 3. Patient to report any pedal injuries to medical professional  4. Follow up 3 months.  5. Patient/POA to call should there be a concern in the interim.

## 2018-07-11 ENCOUNTER — Encounter (HOSPITAL_COMMUNITY): Payer: Self-pay

## 2018-07-11 ENCOUNTER — Telehealth (HOSPITAL_COMMUNITY): Payer: Self-pay

## 2018-07-11 NOTE — Telephone Encounter (Signed)
As there have been 3 unsuccessful attempts to reach patient regarding lab results, a letter was sent for patient to contact office to discuss.

## 2018-07-14 ENCOUNTER — Other Ambulatory Visit (HOSPITAL_COMMUNITY): Payer: Self-pay

## 2018-07-14 ENCOUNTER — Encounter: Payer: Medicare HMO | Admitting: Psychology

## 2018-07-14 ENCOUNTER — Telehealth (HOSPITAL_COMMUNITY): Payer: Self-pay

## 2018-07-14 NOTE — Telephone Encounter (Signed)
Pt called to confirm today's [facility] visit.  We agreed on 330.

## 2018-07-14 NOTE — Progress Notes (Signed)
Paramedicine Encounter    Patient ID: Rhonda [name], female    DOB: 1950/07/19, 68 y.o.   MRN: [id number]    Patient Care Team: Maurice Small, MD as PCP - General (Family Medicine) [name], LCSW as Social Worker (Licensed Clinical Social Worker)  Patient Active Problem List   Diagnosis Date Noted  . Orthostatic hypotension 07/28/2017  . SVD (spontaneous vaginal delivery)   . Peripheral neuropathy   . On home oxygen therapy   . Migraines   . Left bundle branch block   . Hypothyroidism   . Hypertension   . Hyperlipidemia   . Heart murmur   . GERD (gastroesophageal reflux disease)   . Exertional shortness of breath   . Depression   . Complication of anesthesia   . Back pain   . Arthritis   . Anxiety   . Anemia   . HTN (hypertension) 09/14/2013  . Chronic respiratory failure ([place]) 09/14/2013  . 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 Morbid obesity ([place]) 11/01/2012  . Chronic systolic heart failure ([place]) 10/27/2012  . S/P right TKA 05/10/2012  . Endometrial polyp 01/20/2012  . Breast cancer, stage 1 ([place]) 03/26/2011  . Unspecified vitamin D deficiency 03/26/2011    Current Outpatient Medications:  .  acetaZOLAMIDE (DIAMOX) 125 MG tablet, TAKE 1 TABLET(125 MG) BY MOUTH DAILY (Patient taking differently: Take 125 mg by mouth daily. ), Disp: 30 tablet, Rfl: 5 .  buPROPion (WELLBUTRIN XL) 150 MG 24 hr tablet, Take 450 mg by mouth every morning. , Disp: , Rfl:  .  carvedilol (COREG) 3.125 MG tablet, Take 3.125 mg by mouth 2 (two) times daily with a meal., Disp: , Rfl:  .  citalopram (CELEXA) 20 MG tablet, Take 20 mg by mouth daily., Disp: , Rfl:  .  clonazePAM (KLONOPIN) 0.5 MG tablet, Take 0.5 mg by mouth 3 (three) times daily as needed for anxiety. , Disp: , Rfl:  .  colestipol (COLESTID) 1 g tablet, Take 2 g by mouth 2 (two) times daily. , Disp: , Rfl:  .  gabapentin (NEURONTIN) 300 MG capsule, Take 300 mg by mouth 3 (three)  times daily. , Disp: , Rfl:  .  levothyroxine (SYNTHROID, LEVOTHROID) 100 MCG tablet, TAKE 1 TABLET(100 MCG) BY MOUTH DAILY, Disp: 45 tablet, Rfl: 3 .  midodrine (PROAMATINE) 10 MG tablet, Take 1 tablet (10 mg total) by mouth 3 (three) times daily with meals., Disp: 90 tablet, Rfl: 3 .  montelukast (SINGULAIR) 10 MG tablet, Take 10 mg by mouth at bedtime. , Disp: , Rfl: 1 .  Multiple Vitamin (MULTIVITAMIN WITH MINERALS) TABS tablet, Take 1 tablet by mouth daily. One-A-Day 50+, Disp: , Rfl:  .  potassium chloride SA (K-DUR,KLOR-CON) 20 MEQ tablet, Take 3 tablets (60 mEq total) by mouth 2 (two) times daily., Disp: 180 tablet, Rfl: 11 .  simvastatin (ZOCOR) 10 MG tablet, Take 10 mg by mouth every evening. , Disp: , Rfl:  .  spironolactone (ALDACTONE) 25 MG tablet, Take 0.5 tablets (12.5 mg total) by mouth daily. (Patient taking differently: Take 12.5 mg by mouth every evening. ), Disp: 45 tablet, Rfl: 3 .  torsemide (DEMADEX) 20 MG tablet, Take 3 tablets (60 mg total) by mouth daily. (Patient taking differently: Take 60 mg by mouth 2 (two) times daily. ), Disp: 180 tablet, Rfl: 3 .  zolpidem (AMBIEN) 10 MG tablet, Take 10 mg by mouth at bedtime as needed for sleep. For sleep , Disp: , Rfl:  .  acetaminophen (TYLENOL) 500 MG tablet, Take 1,000-1,500 mg by mouth every 6 (six) hours as needed (for pain.)., Disp: , Rfl:  .  carboxymethylcellulose (REFRESH PLUS) 0.5 % SOLN, Place 1 drop into both eyes 3 (three) times daily as needed (dry eyes)., Disp: , Rfl:  .  clindamycin (CLEOCIN) 300 MG capsule, Take 600 mg by mouth See admin instructions. Take 2 capsules (600 mg) by mouth 1 hour prior to dental procedure., Disp: , Rfl:  .  fexofenadine (ALLEGRA) 180 MG tablet, Take 180 mg by mouth 2 (two) times daily., Disp: , Rfl:  .  fluticasone (FLONASE) 50 MCG/ACT nasal spray, Place 2 sprays into both nostrils daily as needed for allergies. , Disp: , Rfl: 7 .  gabapentin (NEURONTIN) 800 MG tablet, Take 800 mg by  mouth at bedtime as needed (neuropathy). , Disp: , Rfl:  .  ibuprofen (ADVIL,MOTRIN) 800 MG tablet, Take 800 mg by mouth 3 (three) times daily., Disp: , Rfl:  .  ipratropium (ATROVENT) 0.03 % nasal spray, Place 2 sprays into both nostrils every 12 (twelve) hours as needed for rhinitis., Disp: , Rfl:  .  meloxicam (MOBIC) 15 MG tablet, Take 15 mg by mouth daily. Daily with food for 10 days then PRN, Disp: , Rfl:  .  metolazone (ZAROXOLYN) 2.5 MG tablet, take 1 tablet by mouth if needed for FLUID OR EDEMA (Patient taking differently: Take 2.5 mg by mouth daily as needed (fluid retention/edema (weight gain of greater than 6 lbs)). ), Disp: 30 tablet, Rfl: 2 .  polyethylene glycol (MIRALAX / GLYCOLAX) packet, Take 17 g by mouth daily as needed for moderate constipation., Disp: , Rfl:  .  venlafaxine (EFFEXOR) 37.5 MG tablet, Take 37.5 mg by mouth daily. Daily for 7 days then stop, Disp: , Rfl:  Allergies  Allergen Reactions  . Ceftin Anaphylaxis    Face and throat swell   . Shellfish Allergy Other (See Comments)    Gout exacerbation  . Allopurinol Nausea Only and Other (See Comments)    weakness  . Ativan [Lorazepam] Itching  . Sulfa Antibiotics Itching  . Ultram [Tramadol Hcl] Itching     Social History   Socioeconomic History  . Marital status: Married    Spouse name: Not on file  . Number of children: 2  . Years of education: masters  . Highest education level: Not on file  Occupational History  . Not on file  Social Needs  . Financial resource strain: Not on file  . Food insecurity:    Worry: Not on file    Inability: Not on file  . Transportation needs:    Medical: Not on file    Non-medical: Not on file  Tobacco Use  . Smoking status: Former Smoker    Packs/day: 0.10    Years: 26.00    Pack years: 2.60    Types: Cigarettes    Last attempt to quit: 05/06/2007    Years since quitting: 11.2  . Smokeless tobacco: Never Used  Substance and Sexual Activity  . Alcohol use:  No  . Drug use: No  . Sexual activity: Yes  Lifestyle  . Physical activity:    Days per week: Not on file    Minutes per session: Not on file  . Stress: Not on file  Relationships  . Social connections:    Talks on phone: Not on file    Gets together: Not on file    Attends religious service: Not on file  Active member of club or organization: Not on file    Attends meetings of clubs or organizations: Not on file    Relationship status: Not on file  . Intimate partner violence:    Fear of current or ex partner: Not on file    Emotionally abused: Not on file    Physically abused: Not on file    Forced sexual activity: Not on file  Other Topics Concern  . Not on file  Social History Narrative   Tobacco Use Cigarettes: Former Smoker, Quit in 2008   No Alcohol   No recreational drug use   Diet: Regular/Low Carb   Exercise: None   Occupation: disabled   Education: Research officer, political party, masters   Children: 2   Firearms: No   Therapist, art Use: Always   Former Metallurgist.        Physical Exam Pulmonary:     Effort: No respiratory distress.     Breath sounds: No rales.  Musculoskeletal:        General: No swelling.     Right lower leg: No edema.     Left lower leg: No edema.  Skin:    General: Skin is warm and dry.         Future Appointments  Date Time Provider [place]  07/18/2018  8:05 AM [facility]  07/18/2018  1:45 PM [facility] None  07/27/2018  8:50 AM [name], Arvin Collard K, DO [facility] None  07/27/2018 10:00 AM [facility] None  08/01/2018  7:45 AM [facility]  09/28/2018 11:20 AM Bensimhon, Shaune Pascal, MD [facility] None  10/05/2018  1:15 PM Marzetta Board, DPM [facility]  12/08/2018 10:15 AM Philemon Kingdom, MD [facility] None     BP 130/80 (BP Location: Right Arm, Patient Position: Sitting, Cuff Size: Large)   Pulse 72   Resp 16   Wt 182 lb 1.6  oz (82.6 kg)   SpO2 98%   BMI 35.56 kg/m   Weight yesterday-182 Last visit weight-182  ATF pt CAO x4 laying on the couch taking a nap.  She stated that she's "bummed because she got a call from the heart clinic that her potassium is low".  Pt missed Sunday meds and a couple doses of the levothryoxin. She denies sob, chest pain and dizziness.  RX bottles verified and pill box refilled.    Medication ordered: Citalopram filled completely fexofendadine (only one dose in pill box)  Maelynn Moroney, EMT Paramedic [phone] 07/15/2018    ACTION: Home visit completed

## 2018-07-15 ENCOUNTER — Encounter (HOSPITAL_COMMUNITY): Payer: Self-pay

## 2018-07-18 ENCOUNTER — Ambulatory Visit (INDEPENDENT_AMBULATORY_CARE_PROVIDER_SITE_OTHER): Payer: Medicare HMO | Admitting: *Deleted

## 2018-07-18 ENCOUNTER — Ambulatory Visit (HOSPITAL_COMMUNITY)
Admission: RE | Admit: 2018-07-18 | Discharge: 2018-07-18 | Disposition: A | Discharge status: Home / Self Care | Payer: Medicare HMO | Source: Ambulatory Visit | Attending: Cardiology | Admitting: Cardiology

## 2018-07-18 DIAGNOSIS — I5022 Chronic systolic (congestive) heart failure: Secondary | ICD-10-CM | POA: Insufficient documentation

## 2018-07-18 DIAGNOSIS — I428 Other cardiomyopathies: Secondary | ICD-10-CM

## 2018-07-18 LAB — BASIC METABOLIC PANEL
Anion gap: 8 (ref 5–15)
BUN: 21 mg/dL (ref 8–23)
CO2: 26 mmol/L (ref 22–32)
Calcium: 9 mg/dL (ref 8.9–10.3)
Chloride: 105 mmol/L (ref 98–111)
Creatinine, Ser: 1.43 mg/dL — ABNORMAL HIGH (ref 0.44–1.00)
GFR calc Af Amer: 44 mL/min — ABNORMAL LOW (ref >60)
GFR calc non Af Amer: 38 mL/min — ABNORMAL LOW (ref >60)
Glucose, Bld: 77 mg/dL (ref 70–99)
Potassium: 3.7 mmol/L (ref 3.5–5.1)
Sodium: 139 mmol/L (ref 135–145)

## 2018-07-19 LAB — CUP PACEART REMOTE DEVICE CHECK
Battery Remaining Longevity: 20 mo
Battery Voltage: 2.92 V
Brady Statistic AP VP Percent: 0.04 %
Brady Statistic AP VS Percent: 0.01 %
Brady Statistic AS VP Percent: 99.93 %
Brady Statistic AS VS Percent: 0.03 %
Brady Statistic RA Percent Paced: 0.04 %
Brady Statistic RV Percent Paced: 99.96 %
Date Time Interrogation Session: 20200224072603
HighPow Impedance: 59 Ohm
Implantable Lead Implant Date: 20141210
Implantable Lead Implant Date: 20141210
Implantable Lead Implant Date: 20141210
Implantable Lead Location: 753858
Implantable Lead Location: 753859
Implantable Lead Location: 753860
Implantable Lead Model: 4396
Implantable Lead Model: 5076
Implantable Lead Model: 6935
Implantable Pulse Generator Implant Date: 20141210
Lead Channel Impedance Value: 342 Ohm
Lead Channel Impedance Value: 380 Ohm
Lead Channel Impedance Value: 494 Ohm
Lead Channel Impedance Value: 532 Ohm
Lead Channel Impedance Value: 589 Ohm
Lead Channel Impedance Value: 627 Ohm
Lead Channel Pacing Threshold Amplitude: 0.5 V
Lead Channel Pacing Threshold Amplitude: 0.875 V
Lead Channel Pacing Threshold Amplitude: 1.125 V
Lead Channel Pacing Threshold Pulse Width: 0.4 ms
Lead Channel Pacing Threshold Pulse Width: 0.4 ms
Lead Channel Pacing Threshold Pulse Width: 0.4 ms
Lead Channel Sensing Intrinsic Amplitude: 2.625 mV
Lead Channel Sensing Intrinsic Amplitude: 2.625 mV
Lead Channel Sensing Intrinsic Amplitude: 20.75 mV
Lead Channel Sensing Intrinsic Amplitude: 20.75 mV
Lead Channel Setting Pacing Amplitude: 2 V
Lead Channel Setting Pacing Amplitude: 2.25 V
Lead Channel Setting Pacing Amplitude: 2.5 V
Lead Channel Setting Pacing Pulse Width: 0.4 ms
Lead Channel Setting Pacing Pulse Width: 0.4 ms
Lead Channel Setting Sensing Sensitivity: 0.3 mV

## 2018-07-22 ENCOUNTER — Other Ambulatory Visit (HOSPITAL_COMMUNITY): Payer: Self-pay

## 2018-07-22 ENCOUNTER — Encounter (HOSPITAL_COMMUNITY): Payer: Self-pay

## 2018-07-22 ENCOUNTER — Telehealth (HOSPITAL_COMMUNITY): Payer: Self-pay

## 2018-07-22 NOTE — Progress Notes (Signed)
Paramedicine Encounter    Patient ID: Rhonda [name], female    DOB: [birth year], 68 y.o.   MRN: [id number]    Patient Care Team: Maurice Small, MD as PCP - General (Family Medicine) [name], LCSW as Social Worker (Licensed Clinical Social Worker)  Patient Active Problem List   Diagnosis Date Noted  . Orthostatic hypotension 07/28/2017  . SVD (spontaneous vaginal delivery)   . Peripheral neuropathy   . On home oxygen therapy   . Migraines   . Left bundle branch block   . Hypothyroidism   . Hypertension   . Hyperlipidemia   . Heart murmur   . GERD (gastroesophageal reflux disease)   . Exertional shortness of breath   . Depression   . Complication of anesthesia   . Back pain   . Arthritis   . Anxiety   . Anemia   . HTN (hypertension) 09/14/2013  . Chronic respiratory failure ([place]) 09/14/2013  . 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 Morbid obesity ([place]) 11/01/2012  . Chronic systolic heart failure ([place]) 10/27/2012  . S/P right TKA 05/10/2012  . Endometrial polyp 01/20/2012  . Breast cancer, stage 1 ([place]) 03/26/2011  . Unspecified vitamin D deficiency 03/26/2011    Current Outpatient Medications:  .  acetaminophen (TYLENOL) 500 MG tablet, Take 1,000-1,500 mg by mouth every 6 (six) hours as needed (for pain.)., Disp: , Rfl:  .  acetaZOLAMIDE (DIAMOX) 125 MG tablet, TAKE 1 TABLET(125 MG) BY MOUTH DAILY (Patient taking differently: Take 125 mg by mouth daily. ), Disp: 30 tablet, Rfl: 5 .  buPROPion (WELLBUTRIN XL) 150 MG 24 hr tablet, Take 450 mg by mouth every morning. , Disp: , Rfl:  .  carboxymethylcellulose (REFRESH PLUS) 0.5 % SOLN, Place 1 drop into both eyes 3 (three) times daily as needed (dry eyes)., Disp: , Rfl:  .  carvedilol (COREG) 3.125 MG tablet, Take 3.125 mg by mouth 2 (two) times daily with a meal., Disp: , Rfl:  .  citalopram (CELEXA) 20 MG tablet, Take 20 mg by mouth daily., Disp: , Rfl:  .  clindamycin  (CLEOCIN) 300 MG capsule, Take 600 mg by mouth See admin instructions. Take 2 capsules (600 mg) by mouth 1 hour prior to dental procedure., Disp: , Rfl:  .  clonazePAM (KLONOPIN) 0.5 MG tablet, Take 0.5 mg by mouth 3 (three) times daily as needed for anxiety. , Disp: , Rfl:  .  colestipol (COLESTID) 1 g tablet, Take 2 g by mouth 2 (two) times daily. , Disp: , Rfl:  .  fexofenadine (ALLEGRA) 180 MG tablet, Take 180 mg by mouth 2 (two) times daily., Disp: , Rfl:  .  fluticasone (FLONASE) 50 MCG/ACT nasal spray, Place 2 sprays into both nostrils daily as needed for allergies. , Disp: , Rfl: 7 .  gabapentin (NEURONTIN) 300 MG capsule, Take 300 mg by mouth 3 (three) times daily. , Disp: , Rfl:  .  gabapentin (NEURONTIN) 800 MG tablet, Take 800 mg by mouth at bedtime as needed (neuropathy). , Disp: , Rfl:  .  ibuprofen (ADVIL,MOTRIN) 800 MG tablet, Take 800 mg by mouth 3 (three) times daily., Disp: , Rfl:  .  ipratropium (ATROVENT) 0.03 % nasal spray, Place 2 sprays into both nostrils every 12 (twelve) hours as needed for rhinitis., Disp: , Rfl:  .  levothyroxine (SYNTHROID, LEVOTHROID) 100 MCG tablet, TAKE 1 TABLET(100 MCG) BY MOUTH DAILY, Disp: 45 tablet, Rfl: 3 .  meloxicam (MOBIC) 15 MG tablet, Take  15 mg by mouth daily. Daily with food for 10 days then PRN, Disp: , Rfl:  .  metolazone (ZAROXOLYN) 2.5 MG tablet, take 1 tablet by mouth if needed for FLUID OR EDEMA (Patient taking differently: Take 2.5 mg by mouth daily as needed (fluid retention/edema (weight gain of greater than 6 lbs)). ), Disp: 30 tablet, Rfl: 2 .  midodrine (PROAMATINE) 10 MG tablet, Take 1 tablet (10 mg total) by mouth 3 (three) times daily with meals., Disp: 90 tablet, Rfl: 3 .  montelukast (SINGULAIR) 10 MG tablet, Take 10 mg by mouth at bedtime. , Disp: , Rfl: 1 .  Multiple Vitamin (MULTIVITAMIN WITH MINERALS) TABS tablet, Take 1 tablet by mouth daily. One-A-Day 50+, Disp: , Rfl:  .  polyethylene glycol (MIRALAX / GLYCOLAX)  packet, Take 17 g by mouth daily as needed for moderate constipation., Disp: , Rfl:  .  potassium chloride SA (K-DUR,KLOR-CON) 20 MEQ tablet, Take 3 tablets (60 mEq total) by mouth 2 (two) times daily., Disp: 180 tablet, Rfl: 11 .  simvastatin (ZOCOR) 10 MG tablet, Take 10 mg by mouth every evening. , Disp: , Rfl:  .  spironolactone (ALDACTONE) 25 MG tablet, Take 0.5 tablets (12.5 mg total) by mouth daily. (Patient taking differently: Take 12.5 mg by mouth every evening. ), Disp: 45 tablet, Rfl: 3 .  torsemide (DEMADEX) 20 MG tablet, Take 3 tablets (60 mg total) by mouth daily. (Patient taking differently: Take 60 mg by mouth 2 (two) times daily. ), Disp: 180 tablet, Rfl: 3 .  venlafaxine (EFFEXOR) 37.5 MG tablet, Take 37.5 mg by mouth daily. Daily for 7 days then stop, Disp: , Rfl:  .  zolpidem (AMBIEN) 10 MG tablet, Take 10 mg by mouth at bedtime as needed for sleep. For sleep , Disp: , Rfl:  Allergies  Allergen Reactions  . Ceftin Anaphylaxis    Face and throat swell   . Shellfish Allergy Other (See Comments)    Gout exacerbation  . Allopurinol Nausea Only and Other (See Comments)    weakness  . Ativan [Lorazepam] Itching  . Sulfa Antibiotics Itching  . Ultram [Tramadol Hcl] Itching     Social History   Socioeconomic History  . Marital status: Married    Spouse name: Not on file  . Number of children: 2  . Years of education: masters  . Highest education level: Not on file  Occupational History  . Not on file  Social Needs  . Financial resource strain: Not on file  . Food insecurity:    Worry: Not on file    Inability: Not on file  . Transportation needs:    Medical: Not on file    Non-medical: Not on file  Tobacco Use  . Smoking status: Former Smoker    Packs/day: 0.10    Years: 26.00    Pack years: 2.60    Types: Cigarettes    Last attempt to quit: 05/06/2007    Years since quitting: 11.2  . Smokeless tobacco: Never Used  Substance and Sexual Activity  . Alcohol  use: No  . Drug use: No  . Sexual activity: Yes  Lifestyle  . Physical activity:    Days per week: Not on file    Minutes per session: Not on file  . Stress: Not on file  Relationships  . Social connections:    Talks on phone: Not on file    Gets together: Not on file    Attends religious service: Not on file  Active member of club or organization: Not on file    Attends meetings of clubs or organizations: Not on file    Relationship status: Not on file  . Intimate partner violence:    Fear of current or ex partner: Not on file    Emotionally abused: Not on file    Physically abused: Not on file    Forced sexual activity: Not on file  Other Topics Concern  . Not on file  Social History Narrative   Tobacco Use Cigarettes: Former Smoker, Quit in 2008   No Alcohol   No recreational drug use   Diet: Regular/Low Carb   Exercise: None   Occupation: disabled   Education: Research officer, political party, masters   Children: 2   Firearms: No   Therapist, art Use: Always   Former Metallurgist.        Physical Exam Pulmonary:     Effort: Pulmonary effort is normal. No respiratory distress.     Breath sounds: No wheezing or rales.  Abdominal:     General: There is no distension.  Musculoskeletal:        General: No swelling.  Skin:    General: Skin is warm and dry.         Future Appointments  Date Time Provider [place]  07/27/2018  8:50 AM Narda Amber K, DO [facility] None  07/27/2018 10:00 AM [facility] None  08/01/2018  7:45 AM [facility] DEVICE REMOTES [facility]  09/28/2018 11:20 AM Bensimhon, Shaune Pascal, MD [facility] None  10/05/2018  1:15 PM Marzetta Board, DPM [facility]  12/08/2018 10:15 AM Philemon Kingdom, MD [facility] None     Pulse 96   Resp 16   Wt 184 lb (83.5 kg)   SpO2 98%   BMI 35.94 kg/m   BP 110/p  Weight yesterday-182 Last visit weight-181  ATF pt CAO x4 standing in the kitchen. She stated that she's only  taken levothryoxin this morning, "because she just woke up". She denies sob, chest pain, and dizziness.  She stated that she is waking up early mornings but not due to sob.  Pt is still cautious about the food she eats, although she's confused about the instructions from the "kidney doctor" to drink more fluids.  I advised her to continue what she's doing per Dr. Missy Sabins, during her last Advance heart failure appointment.  rx bottles verified and pill box   * pt weighed in her clothes which included a heavy house robe today.   Medication ordered: Colestipol filled until [facility] Kearsten Ginther, EMT Paramedic [phone] 07/22/2018    ACTION: Home visit completed

## 2018-07-22 NOTE — Telephone Encounter (Signed)
I called pt to remind her of [facility] visit scheduled for this morning.

## 2018-07-23 NOTE — Progress Notes (Signed)
Follow-up Visit   Date: 07/27/18    Rhonda [name] MRN: [id number] DOB: 06-11-1950   Interim History: Rhonda [name] is a 68 y.o.  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  The patient was accompanied to the clinic by husband who also provides collateral information.    History of present illness: She reports having falls since November 2017, some which occurred by tripping over objects and loosing her balance.  Falls become much worse during 2018.  She often gets lightheaded preceding her falls and nearly always falls backwards. She has been diabetic for at least 10 years and has painful neuropathy of the feet, which is well-controlled on gabapentin 300mg  BID.  She takes extra gabapentin 800mg  as needed for severe pain, about once per week.   Over the past several years, she has noticed problems with short-term memory and family get frustrated with her because she is "slow".  She has a housekeeper that cleans her home.  She is able to cook and do laundry.  She manages her own medications and drives, but sometimes feels uncomfortable when driving.  She does not manage her finances.  On one occasion, she forgot how to put her cars into her ignition or to start the wipers.  She is driving and has not been involved in any MVAs.  She manages finances with her husband.  UPDATE 01/03/2018:  She has suffered several falls, all which have occurred in the kitchen when she is standing still while making dinner in the evening.  She has noticed that prolonged standing for 5-10 minutes always triggers her fall which is preceded by lightheadedness.  At her last visit, I ordered CT cervical spine and brain to evaluate her falls and memory changes.  Imaging showed  increased sclerosis of the calvarium and vertebral bodies.  Her PTH was elevated and she is seeing endocrinology.  Findings also showed possible intracranial hypertension and follow-up LP showed mildly raised ICP at 52mm H20.  There has been no change in her gait or falls following the spinal tap.    UPDATE 03/04/2018:  She is here for follow-up visit.  Since starting midodrine 10mg  TID, she has noticed less frequent falls (once per week) and spells of lightheadedness.  She has been able to reduce gabapentin to 300mg  BID without worsening neuropathic pain.  Today, she is tearful and crying because of ongoing forgetfulness and having to repeat herself.  Her husband states that her mood is very low and she seeing a counselor once per month.  She is overwhelmed by all of her medical conditions.    UPDATE 07/27/2018:  She is here for follow-up visit. Unfortunately, she continues to fall about once per week, the last occurred 2 nights ago where she hit the back of her head.  She was walking to the living room from the kitchen. Husband says that she cannot focus on tasks, even walking which causes her to fall. If she is standing too long, such as when in the kitchen, this always triggers a spells.  She has some confusion and difficulty standing back up, no loss of consciousness.  She continues to have difficulty with memory and feels that she is repeating herself all the time, often frustrating her family.  Husband states that she can talk nonsense sometime, especially at  night. Patient reports hearing voices and talking back to them.  She is seeing psychiatry and a Social worker.  She does not have problems controlling bowel/bladder.   Medications:  Current Outpatient Medications on File Prior to Visit  Medication Sig Dispense Refill  . acetaminophen (TYLENOL) 500 MG tablet Take 1,000-1,500 mg by mouth every 6 (six) hours as needed (for pain.).    Marland Kitchen acetaZOLAMIDE (DIAMOX) 125 MG tablet TAKE 1 TABLET(125 MG) BY MOUTH  DAILY (Patient taking differently: Take 125 mg by mouth daily. ) 30 tablet 5  . buPROPion (WELLBUTRIN XL) 150 MG 24 hr tablet Take 450 mg by mouth every morning.     . carboxymethylcellulose (REFRESH PLUS) 0.5 % SOLN Place 1 drop into both eyes 3 (three) times daily as needed (dry eyes).    . carvedilol (COREG) 3.125 MG tablet Take 3.125 mg by mouth 2 (two) times daily with a meal.    . citalopram (CELEXA) 20 MG tablet Take 20 mg by mouth daily.    . clindamycin (CLEOCIN) 300 MG capsule Take 600 mg by mouth See admin instructions. Take 2 capsules (600 mg) by mouth 1 hour prior to dental procedure.    . clonazePAM (KLONOPIN) 0.5 MG tablet Take 0.5 mg by mouth 3 (three) times daily as needed for anxiety.     . colestipol (COLESTID) 1 g tablet Take 2 g by mouth 2 (two) times daily.     . fexofenadine (ALLEGRA) 180 MG tablet Take 180 mg by mouth 2 (two) times daily.    . fluticasone (FLONASE) 50 MCG/ACT nasal spray Place 2 sprays into both nostrils daily as needed for allergies.   7  . gabapentin (NEURONTIN) 300 MG capsule Take 300 mg by mouth 3 (three) times daily.     Marland Kitchen gabapentin (NEURONTIN) 800 MG tablet Take 800 mg by mouth at bedtime as needed (neuropathy).     Marland Kitchen ibuprofen (ADVIL,MOTRIN) 800 MG tablet Take 800 mg by mouth 3 (three) times daily.    Marland Kitchen ipratropium (ATROVENT) 0.03 % nasal spray Place 2 sprays into both nostrils every 12 (twelve) hours as needed for rhinitis.    Marland Kitchen levothyroxine (SYNTHROID, LEVOTHROID) 100 MCG tablet TAKE 1 TABLET(100 MCG) BY MOUTH DAILY 45 tablet 3  . meloxicam (MOBIC) 15 MG tablet Take 15 mg by mouth daily. Daily with food for 10 days then PRN    . metolazone (ZAROXOLYN) 2.5 MG tablet take 1 tablet by mouth if needed for FLUID OR EDEMA (Patient taking differently: Take 2.5 mg by mouth daily as needed (fluid retention/edema (weight gain of greater than 6 lbs)). ) 30 tablet 2  . midodrine (PROAMATINE) 10 MG tablet Take 1 tablet (10 mg total) by mouth 3 (three) times  daily with meals. 90 tablet 3  . montelukast (SINGULAIR) 10 MG tablet Take 10 mg by mouth at bedtime.   1  . Multiple Vitamin (MULTIVITAMIN WITH MINERALS) TABS tablet Take 1 tablet by mouth daily. One-A-Day 50+    . polyethylene glycol (MIRALAX / GLYCOLAX) packet Take 17 g by mouth daily as needed for moderate constipation.    . potassium chloride SA (K-DUR,KLOR-CON) 20 MEQ tablet Take 3 tablets (60 mEq total) by mouth 2 (two) times daily. 180 tablet 11  . simvastatin (ZOCOR) 10 MG tablet Take 10 mg by mouth every evening.     Marland Kitchen spironolactone (ALDACTONE) 25 MG tablet Take 0.5 tablets (12.5 mg total) by mouth daily. (Patient taking differently: Take 12.5 mg by mouth every evening. ) 45  tablet 3  . torsemide (DEMADEX) 20 MG tablet Take 3 tablets (60 mg total) by mouth daily. (Patient taking differently: Take 60 mg by mouth 2 (two) times daily. ) 180 tablet 3  . venlafaxine (EFFEXOR) 37.5 MG tablet Take 37.5 mg by mouth daily. Daily for 7 days then stop    . zolpidem (AMBIEN) 10 MG tablet Take 10 mg by mouth at bedtime as needed for sleep. For sleep      No current facility-administered medications on file prior to visit.     Allergies:  Allergies  Allergen Reactions  . Ceftin Anaphylaxis    Face and throat swell   . Shellfish Allergy Other (See Comments)    Gout exacerbation  . Allopurinol Nausea Only and Other (See Comments)    weakness  . Ativan [Lorazepam] Itching  . Sulfa Antibiotics Itching  . Ultram [Tramadol Hcl] Itching    Review of Systems:  CONSTITUTIONAL: No fevers, chills, night sweats, or weight loss.  EYES: No visual changes or eye pain ENT: No hearing changes.  No history of nose bleeds.   RESPIRATORY: No cough, wheezing and shortness of breath.  +lightheadedness CARDIOVASCULAR: Negative for chest pain, and palpitations.   GI: Negative for abdominal discomfort, blood in stools or black stools.  No recent change in bowel habits.   GU:  No history of incontinence.     MUSCLOSKELETAL: No history of joint pain or swelling.  No myalgias.   SKIN: Negative for lesions, rash, and itching.   ENDOCRINE: Negative for cold or heat intolerance, polydipsia or goiter.   PSYCH:  + depression +anxiety symptoms.   NEURO: As Above.   Vital Signs:  BP 118/70   Pulse 86   Ht 5' (1.524 m)   Wt 181 lb 6 oz (82.3 kg)   SpO2 94%   BMI 35.42 kg/m   No data found.  General Medical Exam:   General:  Well appearing, comfortable  Eyes/ENT: see cranial nerve examination.   Neck:   No carotid bruits. Respiratory:  Clear to auscultation, good air entry bilaterally.   Cardiac:  Regular rate and rhythm, no murmur.   Ext:  Mild edema of the legs  Neurological Exam: MENTAL STATUS including orientation to time, place, person, recent and remote memory is attention span and concentration, language, and fund of knowledge is fairly poor.  Follows commands easily.  Speech is slow and dysarthric (baseline).  Montreal Cognitive Assessment  07/27/2018 07/23/2017 07/31/2016  Visuospatial/ Executive (0/5) 2 4 5   Naming (0/3) 2 3 3   Attention: Read list of digits (0/2) 1 2 2   Attention: Read list of letters (0/1) 1 1 1   Attention: Serial 7 subtraction starting at 100 (0/3) 0 1 0  Language: Repeat phrase (0/2) 0 2 2  Language : Fluency (0/1) 0 1 1  Abstraction (0/2) 0 2 2  Delayed Recall (0/5) 1 0 0  Orientation (0/6) 6 6 6   Total 13 22 22   Adjusted Score (based on education) 13 22 22     CRANIAL NERVES:  Pupils are round and reactive to light. Extraocular muscles are intact throughout, except mild restriction with upgaze.  No ptosis.  Face is symmetric.   MOTOR:  Motor strength is 5/5 in all extremities. No pronator drift.  Tone is normal.    COORDINATION/GAIT:  Finger to nose testing is intact. There is mild bradykinesia finger tapping.  Gait is slightly-wide based, unassisted.   Data: Labs 07/31/2016:  Vitamin B1 11, vitamin B12 597, copper  162, SPEP with IFE no M protein  CT  head wo contrast performed at [facility] 11/24/2017: No acute intracranial abnormality appreciated.  Expanded empty sella may reflect idiopathic intracranial hypertension.  Thick dense calvarium which is of uncertain clinical significance.  Boney sclerosis can be seen with, but not limited to, hematological conditions such as myelofibrosis, metabolic bone disorders such as hyperthyroidism and hyperparathyroidism and other causes.  Correlate clinically.   CT cervical spine wo contrast 11/24/2017: 1.  No acute fracture 2.  No significant spinal canal or foraminal stenosis 3.  Increased sclerosis of the bony structures.  Boney sclerosis can be seen with, but not limited to, hematological conditions such as myelofibrosis, metabolic bone disorders such as hyperthyroidism and hyperparathyroidism and other causes.  Correlate clinically.    IMPRESSION/PLAN: 1. 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    - I will refer her for formal neurocognitive testing, this has been delayed due to Dr. Si Raider leaving, we will reschedule this  - Continue to see a counselor for depression  - I believe polypharmacy and depression is contributing to her cognitive impairment  2. Gait abnormality and falls due to orthostatic hypotension and sensory ataxia   - This is unlikely drop attacks, will check EEG   - Continue midodrine 10mg  TID  - Encouraged to start using a walker and use it to sit when in the kitchen to avoid prolonged standing  3. Intracranial hypertension with OP 26cm H20    - Unable to get MRI due to ICD  - Continue low dose diamox 125mg  daily  4. 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  If I can answer any additional questions, I would be pleased to do so.     Sincerely,    Gladine Plude K. [name], DO

## 2018-07-25 ENCOUNTER — Telehealth: Payer: Self-pay | Admitting: Physician Assistant

## 2018-07-25 ENCOUNTER — Telehealth: Payer: Self-pay | Admitting: Neurology

## 2018-07-25 NOTE — Telephone Encounter (Signed)
Patient states that she  had a really bad fall last night and hit her head. Is having a headache and would like to speak to someone today. She need to know who she needs to call when this happens please call her today she was crying on the phone

## 2018-07-25 NOTE — Telephone Encounter (Signed)
Patient is calling very angry that Dr. [name] has not called her back. I gave her Dr. Serita Grit number. She remains angry that Dr. [name] has not called her back.

## 2018-07-26 ENCOUNTER — Other Ambulatory Visit: Payer: Self-pay

## 2018-07-26 ENCOUNTER — Encounter (HOSPITAL_COMMUNITY): Payer: Self-pay

## 2018-07-26 ENCOUNTER — Emergency Department (HOSPITAL_COMMUNITY): Payer: Medicare HMO

## 2018-07-26 ENCOUNTER — Emergency Department (HOSPITAL_COMMUNITY)
Admission: EM | Admit: 2018-07-26 | Discharge: 2018-07-26 | Disposition: A | Discharge status: Home / Self Care | Payer: Medicare HMO | Attending: Emergency Medicine | Admitting: Emergency Medicine

## 2018-07-26 DIAGNOSIS — H538 Other visual disturbances: Secondary | ICD-10-CM | POA: Diagnosis not present

## 2018-07-26 DIAGNOSIS — Y92000 Kitchen of unspecified non-institutional (private) residence as  the place of occurrence of the external cause: Secondary | ICD-10-CM | POA: Diagnosis not present

## 2018-07-26 DIAGNOSIS — Z853 Personal history of malignant neoplasm of breast: Secondary | ICD-10-CM | POA: Insufficient documentation

## 2018-07-26 DIAGNOSIS — Y998 Other external cause status: Secondary | ICD-10-CM | POA: Insufficient documentation

## 2018-07-26 DIAGNOSIS — E785 Hyperlipidemia, unspecified: Secondary | ICD-10-CM | POA: Insufficient documentation

## 2018-07-26 DIAGNOSIS — Z87891 Personal history of nicotine dependence: Secondary | ICD-10-CM | POA: Insufficient documentation

## 2018-07-26 DIAGNOSIS — R51 Headache: Secondary | ICD-10-CM | POA: Diagnosis not present

## 2018-07-26 DIAGNOSIS — I5022 Chronic systolic (congestive) heart failure: Secondary | ICD-10-CM | POA: Insufficient documentation

## 2018-07-26 DIAGNOSIS — S79911A Unspecified injury of right hip, initial encounter: Secondary | ICD-10-CM | POA: Diagnosis not present

## 2018-07-26 DIAGNOSIS — W010XXA Fall on same level from slipping, tripping and stumbling without subsequent striking against object, initial encounter: Secondary | ICD-10-CM | POA: Insufficient documentation

## 2018-07-26 DIAGNOSIS — Y939 Activity, unspecified: Secondary | ICD-10-CM | POA: Insufficient documentation

## 2018-07-26 DIAGNOSIS — M25551 Pain in right hip: Secondary | ICD-10-CM | POA: Diagnosis not present

## 2018-07-26 DIAGNOSIS — E119 Type 2 diabetes mellitus without complications: Secondary | ICD-10-CM | POA: Insufficient documentation

## 2018-07-26 DIAGNOSIS — W19XXXA Unspecified fall, initial encounter: Secondary | ICD-10-CM

## 2018-07-26 DIAGNOSIS — J45909 Unspecified asthma, uncomplicated: Secondary | ICD-10-CM | POA: Diagnosis not present

## 2018-07-26 DIAGNOSIS — R296 Repeated falls: Secondary | ICD-10-CM | POA: Diagnosis not present

## 2018-07-26 DIAGNOSIS — E039 Hypothyroidism, unspecified: Secondary | ICD-10-CM | POA: Insufficient documentation

## 2018-07-26 DIAGNOSIS — I10 Essential (primary) hypertension: Secondary | ICD-10-CM | POA: Diagnosis not present

## 2018-07-26 DIAGNOSIS — I110 Hypertensive heart disease with heart failure: Secondary | ICD-10-CM | POA: Insufficient documentation

## 2018-07-26 DIAGNOSIS — Z79899 Other long term (current) drug therapy: Secondary | ICD-10-CM | POA: Diagnosis not present

## 2018-07-26 LAB — COMPREHENSIVE METABOLIC PANEL
ALT: 24 U/L (ref 0–44)
AST: 23 U/L (ref 15–41)
Albumin: 3.5 g/dL (ref 3.5–5.0)
Alkaline Phosphatase: 168 U/L — ABNORMAL HIGH (ref 38–126)
Anion gap: 11 (ref 5–15)
BUN: 29 mg/dL — ABNORMAL HIGH (ref 8–23)
CO2: 25 mmol/L (ref 22–32)
Calcium: 8.8 mg/dL — ABNORMAL LOW (ref 8.9–10.3)
Chloride: 105 mmol/L (ref 98–111)
Creatinine, Ser: 1.77 mg/dL — ABNORMAL HIGH (ref 0.44–1.00)
GFR calc Af Amer: 34 mL/min — ABNORMAL LOW (ref >60)
GFR calc non Af Amer: 30 mL/min — ABNORMAL LOW (ref >60)
Glucose, Bld: 94 mg/dL (ref 70–99)
Potassium: 3.5 mmol/L (ref 3.5–5.1)
Sodium: 141 mmol/L (ref 135–145)
Total Bilirubin: 0.5 mg/dL (ref 0.3–1.2)
Total Protein: 7.2 g/dL (ref 6.5–8.1)

## 2018-07-26 LAB — DIFFERENTIAL
Abs Immature Granulocytes: 0.02 10*3/uL (ref 0.00–0.07)
Basophils Absolute: 0 10*3/uL (ref 0.0–0.1)
Basophils Relative: 0 %
Eosinophils Absolute: 0.4 10*3/uL (ref 0.0–0.5)
Eosinophils Relative: 5 %
Immature Granulocytes: 0 %
Lymphocytes Relative: 23 %
Lymphs Abs: 1.8 10*3/uL (ref 0.7–4.0)
Monocytes Absolute: 0.8 10*3/uL (ref 0.1–1.0)
Monocytes Relative: 10 %
Neutro Abs: 4.8 10*3/uL (ref 1.7–7.7)
Neutrophils Relative %: 62 %

## 2018-07-26 LAB — CBC
HCT: 35.8 % — ABNORMAL LOW (ref 36.0–46.0)
Hemoglobin: 11.2 g/dL — ABNORMAL LOW (ref 12.0–15.0)
MCH: 32.1 pg (ref 26.0–34.0)
MCHC: 31.3 g/dL (ref 30.0–36.0)
MCV: 102.6 fL — ABNORMAL HIGH (ref 80.0–100.0)
Platelets: 236 10*3/uL (ref 150–400)
RBC: 3.49 MIL/uL — ABNORMAL LOW (ref 3.87–5.11)
RDW: 13.2 % (ref 11.5–15.5)
WBC: 7.7 10*3/uL (ref 4.0–10.5)
nRBC: 0 % (ref 0.0–0.2)

## 2018-07-26 LAB — PROTIME-INR
INR: 1.2 (ref 0.8–1.2)
Prothrombin Time: 15.1 seconds (ref 11.4–15.2)

## 2018-07-26 LAB — APTT: aPTT: 32 seconds (ref 24–36)

## 2018-07-26 LAB — I-STAT CREATININE, ED: Creatinine, Ser: 1.9 mg/dL — ABNORMAL HIGH (ref 0.44–1.00)

## 2018-07-26 MED ORDER — SODIUM CHLORIDE 0.9% FLUSH
3.0000 mL | Freq: Once | INTRAVENOUS | Status: DC
Start: 2018-07-26 — End: 2018-07-26

## 2018-07-26 NOTE — Telephone Encounter (Signed)
I called patient and left her a message to make sure she keeps her appointment tomorrow so that we can evaluate her.

## 2018-07-26 NOTE — ED Notes (Signed)
Patient states she has been falling a lot, states she is here today because she fell hard than she has before. Ambulatory to ED. States she doesn't know why she falls.

## 2018-07-26 NOTE — ED Triage Notes (Signed)
Pt reports blurred vision and difficulty with ambulation on and off since the fall. VAn neg

## 2018-07-26 NOTE — Progress Notes (Signed)
Remote ICD transmission.   

## 2018-07-26 NOTE — Discharge Instructions (Addendum)
Return here as needed. Follow up with your doctor. °

## 2018-07-26 NOTE — ED Triage Notes (Signed)
Pt told to come to ED by PCP for multiple falls for the last year.  Yesterday fell and hit her head.  No thinners or neck pain.

## 2018-07-26 NOTE — ED Provider Notes (Signed)
[facility] EMERGENCY DEPARTMENT Provider Note   CSN: [id number] Arrival date & time: 07/26/18  0047    History   Chief Complaint Chief Complaint  Patient presents with  . Fall    falls a lot    HPI ABBYE LAO is a 68 y.o. female.     HPI Patient presents to the emergency department with multiple falls over the last 2 years.  The patient states she was in her kitchen when she fell backwards.  The patient states she does not use a cane or a walker.  Patient states she is hurting in the back of her head but has no other pain except maybe some right hip pain.  The patient denies chest pain, shortness of breath, headache,blurred vision, neck pain, fever, cough, weakness, numbness, dizziness, anorexia, edema, abdominal pain, nausea, vomiting, diarrhea, rash, back pain, dysuria, hematemesis, bloody stool, near syncope, or syncope. Past Medical History:  Diagnosis Date  . Anemia   . Anxiety   . Arthritis    RIGHT KNEE ARTHRITIS AND PAIN-PT TOLD BONE ON BONE  . Asthma   . Back pain    DISK PROBLEM  . Breast cancer, stage 1 ([place]) 03/26/2011   left-FINISHED CHEMO  AND RADIATION  . Cardiomyopathy    [name]  . Chronic systolic heart failure (HCC)    a) NICM b) ECHO (03/2013) EF 20-25% c) ECHO (09/2013) EF 45-50%, grade I DD  . Complication of anesthesia    PT STATES HER B/P LOW AFTER ONE OF HER SURGERIES--SHE ATTRIBUTES TO LYING FLAT  . Depression   . Diabetes mellitus    "diet controlled" (05/04/2103)  . Exertional shortness of breath   . GERD (gastroesophageal reflux disease)   . Gout   . Heart murmur   . Hyperlipidemia   . Hypertension   . Hypothyroidism   . Left bundle branch block    s/p CRT-D (04/2013)  . Migraines   . On home oxygen therapy    "2L suppose to be q night" (05/03/2013)  . Peripheral neuropathy    feet  . SVD (spontaneous vaginal delivery)    x 2  . Unspecified vitamin D deficiency 03/26/2011   does  not take meds    Patient Active Problem List   Diagnosis Date Noted  . Orthostatic hypotension 07/28/2017  . SVD (spontaneous vaginal delivery)   . Peripheral neuropathy   . On home oxygen therapy   . Migraines   . Left bundle branch block   . Hypothyroidism   . Hypertension   . Hyperlipidemia   . Heart murmur   . GERD (gastroesophageal reflux disease)   . Exertional shortness of breath   . Depression   . Complication of anesthesia   . Back pain   . Arthritis   . Anxiety   . Anemia   . HTN (hypertension) 09/14/2013  . Chronic respiratory failure ([place]) 09/14/2013  . 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 Morbid obesity ([place]) 11/01/2012  . Chronic systolic heart failure ([place]) 10/27/2012  . S/P right TKA 05/10/2012  . Endometrial polyp 01/20/2012  . Breast cancer, stage 1 ([place]) 03/26/2011  . Unspecified vitamin D deficiency 03/26/2011    Past Surgical History:  Procedure Laterality Date  . BI-VENTRICULAR IMPLANTABLE CARDIOVERTER DEFIBRILLATOR N/A 05/03/2013   Procedure: BI-VENTRICULAR IMPLANTABLE CARDIOVERTER DEFIBRILLATOR  (CRT-D);  Surgeon: Evans Lance, MD;  Location: [facility] CATH LAB;  Service: Cardiovascular;  Laterality: N/A;  .  BI-VENTRICULAR IMPLANTABLE CARDIOVERTER DEFIBRILLATOR  (CRT-D)  05/03/2013   MDT CRTD implanted by Dr Lovena Le for non ischemic cardiomyopathy  . BIOPSY  05/31/2018   Procedure: BIOPSY;  Surgeon: Ronnette Juniper, MD;  Location: [facility] ENDOSCOPY;  Service: Gastroenterology;;  . BREAST LUMPECTOMY Left 07/28/2010  . CARDIAC CATHETERIZATION  2010   NORMAL CORONARY ARTERIES  . 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--11 SURGERIES     PT HAS REMOVABLE SPEECH BULB-TAKES IT OUT BEFORE HER SURGERY  . COLONOSCOPY    . COLONOSCOPY N/A 05/31/2018   Procedure: COLONOSCOPY;  Surgeon: Ronnette Juniper, MD;  Location: [facility]OS[facility];  Service: Gastroenterology;  Laterality: N/A;  . HYSTEROSCOPY W/D&C  01/20/2012   Procedure: DILATATION AND  CURETTAGE /HYSTEROSCOPY;  Surgeon: Margarette Asal, MD;  Location: [facility];  Service: Gynecology;  Laterality: N/A;  with trueclear  . PORT-A-CATH REMOVAL  09/23/2011   Procedure: REMOVAL PORT-A-CATH;  Surgeon: Rolm Bookbinder, MD;  Location: [facility];  Service: General;  Laterality: N/A;  Port Removal  . PORTACATH PLACEMENT  2012  . TONSILLECTOMY  1960's  . TOTAL KNEE ARTHROPLASTY  05/10/2012   Procedure: TOTAL KNEE ARTHROPLASTY;  Surgeon: Mauri Pole, MD;  Location: [facility];  Service: Orthopedics;  Laterality: Right;  . WISDOM TOOTH EXTRACTION       OB History   No obstetric history on file.      Home Medications    Prior to Admission medications   Medication Sig Start Date End Date Taking? Authorizing Provider  acetaminophen (TYLENOL) 500 MG tablet Take 1,000-1,500 mg by mouth every 6 (six) hours as needed (for pain.).    [provider]  acetaZOLAMIDE (DIAMOX) 125 MG tablet TAKE 1 TABLET(125 MG) BY MOUTH DAILY Patient taking differently: Take 125 mg by mouth daily.  04/04/18   Patel, Donika K, DO  buPROPion (WELLBUTRIN XL) 150 MG 24 hr tablet Take 450 mg by mouth every morning.     [provider]  carboxymethylcellulose (REFRESH PLUS) 0.5 % SOLN Place 1 drop into both eyes 3 (three) times daily as needed (dry eyes).    [provider]  carvedilol (COREG) 3.125 MG tablet Take 3.125 mg by mouth 2 (two) times daily with a meal.    [provider]  citalopram (CELEXA) 20 MG tablet Take 20 mg by mouth daily.    [provider]  clindamycin (CLEOCIN) 300 MG capsule Take 600 mg by mouth See admin instructions. Take 2 capsules (600 mg) by mouth 1 hour prior to dental procedure.    [provider]  clonazePAM (KLONOPIN) 0.5 MG tablet Take 0.5 mg by mouth 3 (three) times daily as needed for anxiety.     [provider]  colestipol (COLESTID) 1 g tablet Take 2 g by mouth 2 (two) times daily.     [provider]    fexofenadine (ALLEGRA) 180 MG tablet Take 180 mg by mouth 2 (two) times daily.    [provider]  fluticasone (FLONASE) 50 MCG/ACT nasal spray Place 2 sprays into both nostrils daily as needed for allergies.  11/19/17   [provider]  gabapentin (NEURONTIN) 300 MG capsule Take 300 mg by mouth 3 (three) times daily.     [provider]  gabapentin (NEURONTIN) 800 MG tablet Take 800 mg by mouth at bedtime as needed (neuropathy).     [provider]  ibuprofen (ADVIL,MOTRIN) 800 MG tablet Take 800 mg by mouth 3 (three) times daily.    [provider]  ipratropium (ATROVENT) 0.03 % nasal spray Place 2 sprays into both nostrils every 12 (twelve) hours as needed for rhinitis.    [provider]  levothyroxine (SYNTHROID, LEVOTHROID) 100 MCG tablet TAKE 1 TABLET(100 MCG) BY MOUTH DAILY 06/10/18   Philemon Kingdom, MD  meloxicam (MOBIC) 15 MG tablet Take 15 mg by mouth daily. Daily with food for 10 days then PRN    [provider]  metolazone (ZAROXOLYN) 2.5 MG tablet take 1 tablet by mouth if needed for FLUID OR EDEMA Patient taking differently: Take 2.5 mg by mouth daily as needed (fluid retention/edema (weight gain of greater than 6 lbs)).  05/07/17   Larey Dresser, MD  midodrine (PROAMATINE) 10 MG tablet Take 1 tablet (10 mg total) by mouth 3 (three) times daily with meals. 07/01/18   Patel, Donika K, DO  montelukast (SINGULAIR) 10 MG tablet Take 10 mg by mouth at bedtime.  09/28/16   [provider]  Multiple Vitamin (MULTIVITAMIN WITH MINERALS) TABS tablet Take 1 tablet by mouth daily. One-A-Day 50+    [provider]  polyethylene glycol (MIRALAX / GLYCOLAX) packet Take 17 g by mouth daily as needed for moderate constipation.    [provider]  potassium chloride SA (K-DUR,KLOR-CON) 20 MEQ tablet Take 3 tablets (60 mEq total) by mouth 2 (two) times daily. 10/29/17   Clegg, Amy D, NP  simvastatin (ZOCOR) 10 MG  tablet Take 10 mg by mouth every evening.     [provider]  spironolactone (ALDACTONE) 25 MG tablet Take 0.5 tablets (12.5 mg total) by mouth daily. Patient taking differently: Take 12.5 mg by mouth every evening.  08/25/17   Shirley Friar, PA-C  torsemide (DEMADEX) 20 MG tablet Take 3 tablets (60 mg total) by mouth daily. Patient taking differently: Take 60 mg by mouth 2 (two) times daily.  04/02/18   Georgiana Shore, NP  venlafaxine (EFFEXOR) 37.5 MG tablet Take 37.5 mg by mouth daily. Daily for 7 days then stop    [provider]  zolpidem (AMBIEN) 10 MG tablet Take 10 mg by mouth at bedtime as needed for sleep. For sleep     [provider]    Family History Family History  Problem Relation Age of Onset  . Alzheimer's disease Mother   . CVA Mother   . Hypertension Mother   . Cancer - Colon Father        late 7  . Birth defects Paternal Uncle     Social History Social History   Tobacco Use  . Smoking status: Former Smoker    Packs/day: 0.10    Years: 26.00    Pack years: 2.60    Types: Cigarettes    Last attempt to quit: 05/06/2007    Years since quitting: 11.2  . Smokeless tobacco: Never Used  Substance Use Topics  . Alcohol use: No  . Drug use: No     Allergies   Ceftin; Shellfish allergy; Allopurinol; Ativan [lorazepam]; Sulfa antibiotics; and Ultram [tramadol hcl]   Review of Systems Review of Systems  All other systems negative except as documented in the HPI. All pertinent positives and negatives as reviewed in the HPI. Physical Exam Updated Vital Signs BP 124/61 (BP Location: Right Arm)   Pulse 71   Temp 98 F (36.7 C) (Oral)   Resp 16   SpO2 97%   Physical Exam Vitals signs and nursing note reviewed.  Constitutional:      General: She is not  in acute distress.    Appearance: She is well-developed.  HENT:     Head: Normocephalic and atraumatic.  Eyes:     Pupils: Pupils are equal, round, and reactive to  light.  Neck:     Musculoskeletal: Normal range of motion and neck supple.  Cardiovascular:     Rate and Rhythm: Normal rate and regular rhythm.     Heart sounds: Normal heart sounds. No murmur. No friction rub. No gallop.   Pulmonary:     Effort: Pulmonary effort is normal. No respiratory distress.     Breath sounds: Normal breath sounds. No wheezing.  Abdominal:     General: Bowel sounds are normal. There is no distension.     Palpations: Abdomen is soft.     Tenderness: There is no abdominal tenderness.  Musculoskeletal:     Right hip: She exhibits tenderness. She exhibits normal range of motion, normal strength and no bony tenderness.  Skin:    General: Skin is warm and dry.     Capillary Refill: Capillary refill takes less than 2 seconds.     Findings: No erythema or rash.  Neurological:     General: No focal deficit present.     Mental Status: She is alert and oriented to person, place, and time.     Motor: No weakness or abnormal muscle tone.     Coordination: Coordination normal.     Gait: Gait normal.  Psychiatric:        Mood and Affect: Mood normal.        Behavior: Behavior normal.      ED Treatments / Results  Labs (all labs ordered are listed, but only abnormal results are displayed) Labs Reviewed  CBC - Abnormal; Notable for the following components:      Result Value   RBC 3.49 (*)    Hemoglobin 11.2 (*)    HCT 35.8 (*)    MCV 102.6 (*)    All other components within normal limits  COMPREHENSIVE METABOLIC PANEL - Abnormal; Notable for the following components:   BUN 29 (*)    Creatinine, Ser 1.77 (*)    Calcium 8.8 (*)    Alkaline Phosphatase 168 (*)    GFR calc non Af Amer 30 (*)    GFR calc Af Amer 34 (*)    All other components within normal limits  I-STAT CREATININE, ED - Abnormal; Notable for the following components:   Creatinine, Ser 1.90 (*)    All other components within normal limits  PROTIME-INR  APTT  DIFFERENTIAL    EKG EKG  Interpretation  Date/Time:  Tuesday July 26 2018 01:15:59 EST Ventricular Rate:  76 PR Interval:  188 QRS Duration: 140 QT Interval:  446 QTC Calculation: 501 R Axis:   -71 Text Interpretation:  Electronic ventricular pacemaker Confirmed by Lajean Saver [phone]) on 07/26/2018 7:41:15 AM   Radiology Ct Head Wo Contrast  Result Date: 07/26/2018 CLINICAL DATA:  Blurry vision, difficulty walking EXAM: CT HEAD WITHOUT CONTRAST TECHNIQUE: Contiguous axial images were obtained from the base of the skull through the vertex without intravenous contrast. COMPARISON:  MRI 08/05/2009, CT brain 01/30/2008 FINDINGS: Brain: No acute territorial infarction, hemorrhage or intracranial mass. The ventricles are nonenlarged. Chunky parafalcine calcifications as before. Vascular: No hyperdense vessels.  No unexpected calcification Skull: Right cleft palate defect. Diffuse sclerosis of the calvarium and skull base. No fracture Sinuses/Orbits: Mild mucosal thickening in the maxillary and ethmoid sinuses. Other: None IMPRESSION: 1. No CT evidence  for acute intracranial abnormality. 2. Minimal atrophy. Electronically Signed   By: Donavan Foil M.D.   On: 07/26/2018 01:40   Dg Hip Unilat W Or Wo Pelvis 2-3 Views Right  Result Date: 07/26/2018 CLINICAL DATA:  Fall with leg soreness today. EXAM: DG HIP (WITH OR WITHOUT PELVIS) 2-3V RIGHT COMPARISON:  None. FINDINGS: There is no evidence of hip fracture or dislocation. There is no evidence of arthropathy or other focal bone abnormality. IMPRESSION: Negative. Electronically Signed   By: Monte Fantasia M.D.   On: 07/26/2018 07:02    Procedures Procedures (including critical care time)  Medications Ordered in ED Medications - No data to display   Initial Impression / Assessment and Plan / ED Course  I have reviewed the triage vital signs and the nursing notes.  Pertinent labs & imaging results that were available during my care of the patient were reviewed by me and  considered in my medical decision making (see chart for details).        Patient has been stable here in the emergency department.  Her laboratory testing and x-rays did not show any abnormalities.  Patient is advised to follow-up with her primary doctor to return here as needed.  Final Clinical Impressions(s) / ED Diagnoses   Final diagnoses:  Fall, initial encounter    ED Discharge Orders    None       Dalia Heading, PA-C 07/26/18 1519    Lajean Saver, MD 07/26/18 [phone]

## 2018-07-27 ENCOUNTER — Encounter: Payer: Self-pay | Admitting: Neurology

## 2018-07-27 ENCOUNTER — Other Ambulatory Visit: Payer: Medicare HMO

## 2018-07-27 ENCOUNTER — Ambulatory Visit (INDEPENDENT_AMBULATORY_CARE_PROVIDER_SITE_OTHER): Payer: Medicare HMO | Admitting: Neurology

## 2018-07-27 ENCOUNTER — Ambulatory Visit: Payer: Medicare HMO | Admitting: Neurology

## 2018-07-27 VITALS — BP 118/70 | HR 86 | Ht 60.0 in | Wt 181.4 lb

## 2018-07-27 DIAGNOSIS — R410 Disorientation, unspecified: Secondary | ICD-10-CM | POA: Diagnosis not present

## 2018-07-27 DIAGNOSIS — W19XXXD Unspecified fall, subsequent encounter: Secondary | ICD-10-CM | POA: Diagnosis not present

## 2018-07-27 DIAGNOSIS — E1140 Type 2 diabetes mellitus with diabetic neuropathy, unspecified: Secondary | ICD-10-CM

## 2018-07-27 DIAGNOSIS — R413 Other amnesia: Secondary | ICD-10-CM | POA: Diagnosis not present

## 2018-07-27 DIAGNOSIS — I951 Orthostatic hypotension: Secondary | ICD-10-CM

## 2018-07-27 NOTE — Patient Instructions (Addendum)
Referral for neurocognitive testing  Routine EEG  Return to clinic in 4 months

## 2018-07-28 ENCOUNTER — Telehealth (HOSPITAL_COMMUNITY): Payer: Self-pay

## 2018-07-28 ENCOUNTER — Other Ambulatory Visit (HOSPITAL_COMMUNITY): Payer: Self-pay

## 2018-07-28 NOTE — Telephone Encounter (Signed)
Pt called and stated that her husband has a dr appointment at 2, so requested that I come earlier today.  We agree to 12p

## 2018-07-28 NOTE — Progress Notes (Signed)
Paramedicine Encounter    Patient ID: Rhonda [name], female    DOB: 02/26/1951, 68 y.o.   MRN: [id number]    Patient Care Team: Maurice Small, MD as PCP - General (Family Medicine) [name], LCSW as Social Worker (Licensed Clinical Social Worker)  Patient Active Problem List   Diagnosis Date Noted  . Orthostatic hypotension 07/28/2017  . SVD (spontaneous vaginal delivery)   . Peripheral neuropathy   . On home oxygen therapy   . Migraines   . Left bundle branch block   . Hypothyroidism   . Hypertension   . Hyperlipidemia   . Heart murmur   . GERD (gastroesophageal reflux disease)   . Exertional shortness of breath   . Depression   . Complication of anesthesia   . Back pain   . Arthritis   . Anxiety   . Anemia   . HTN (hypertension) 09/14/2013  . Chronic respiratory failure ([place]) 09/14/2013  . 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 Morbid obesity ([place]) 11/01/2012  . Chronic systolic heart failure ([place]) 10/27/2012  . S/P right TKA 05/10/2012  . Endometrial polyp 01/20/2012  . Breast cancer, stage 1 ([place]) 03/26/2011  . Unspecified vitamin D deficiency 03/26/2011    Current Outpatient Medications:  .  acetaZOLAMIDE (DIAMOX) 125 MG tablet, TAKE 1 TABLET(125 MG) BY MOUTH DAILY (Patient taking differently: Take 125 mg by mouth daily. ), Disp: 30 tablet, Rfl: 5 .  buPROPion (WELLBUTRIN XL) 150 MG 24 hr tablet, Take 450 mg by mouth every morning. , Disp: , Rfl:  .  carvedilol (COREG) 3.125 MG tablet, Take 3.125 mg by mouth 2 (two) times daily with a meal., Disp: , Rfl:  .  citalopram (CELEXA) 20 MG tablet, Take 20 mg by mouth daily., Disp: , Rfl:  .  clonazePAM (KLONOPIN) 0.5 MG tablet, Take 0.5 mg by mouth 3 (three) times daily as needed for anxiety. , Disp: , Rfl:  .  fexofenadine (ALLEGRA) 180 MG tablet, Take 180 mg by mouth 2 (two) times daily., Disp: , Rfl:  .  gabapentin (NEURONTIN) 300 MG capsule, Take 300 mg by mouth 3  (three) times daily. , Disp: , Rfl:  .  levothyroxine (SYNTHROID, LEVOTHROID) 100 MCG tablet, TAKE 1 TABLET(100 MCG) BY MOUTH DAILY, Disp: 45 tablet, Rfl: 3 .  midodrine (PROAMATINE) 10 MG tablet, Take 1 tablet (10 mg total) by mouth 3 (three) times daily with meals., Disp: 90 tablet, Rfl: 3 .  montelukast (SINGULAIR) 10 MG tablet, Take 10 mg by mouth at bedtime. , Disp: , Rfl: 1 .  Multiple Vitamin (MULTIVITAMIN WITH MINERALS) TABS tablet, Take 1 tablet by mouth daily. One-A-Day 50+, Disp: , Rfl:  .  potassium chloride SA (K-DUR,KLOR-CON) 20 MEQ tablet, Take 3 tablets (60 mEq total) by mouth 2 (two) times daily., Disp: 180 tablet, Rfl: 11 .  acetaminophen (TYLENOL) 500 MG tablet, Take 1,000-1,500 mg by mouth every 6 (six) hours as needed (for pain.)., Disp: , Rfl:  .  carboxymethylcellulose (REFRESH PLUS) 0.5 % SOLN, Place 1 drop into both eyes 3 (three) times daily as needed (dry eyes)., Disp: , Rfl:  .  clindamycin (CLEOCIN) 300 MG capsule, Take 600 mg by mouth See admin instructions. Take 2 capsules (600 mg) by mouth 1 hour prior to dental procedure., Disp: , Rfl:  .  colestipol (COLESTID) 1 g tablet, Take 2 g by mouth 2 (two) times daily. , Disp: , Rfl:  .  fluticasone (FLONASE) 50 MCG/ACT nasal spray,  Place 2 sprays into both nostrils daily as needed for allergies. , Disp: , Rfl: 7 .  gabapentin (NEURONTIN) 800 MG tablet, Take 800 mg by mouth at bedtime as needed (neuropathy). , Disp: , Rfl:  .  ibuprofen (ADVIL,MOTRIN) 800 MG tablet, Take 800 mg by mouth 3 (three) times daily., Disp: , Rfl:  .  ipratropium (ATROVENT) 0.03 % nasal spray, Place 2 sprays into both nostrils every 12 (twelve) hours as needed for rhinitis., Disp: , Rfl:  .  meloxicam (MOBIC) 15 MG tablet, Take 15 mg by mouth daily. Daily with food for 10 days then PRN, Disp: , Rfl:  .  metolazone (ZAROXOLYN) 2.5 MG tablet, take 1 tablet by mouth if needed for FLUID OR EDEMA (Patient taking differently: Take 2.5 mg by mouth daily as  needed (fluid retention/edema (weight gain of greater than 6 lbs)). ), Disp: 30 tablet, Rfl: 2 .  polyethylene glycol (MIRALAX / GLYCOLAX) packet, Take 17 g by mouth daily as needed for moderate constipation., Disp: , Rfl:  .  simvastatin (ZOCOR) 10 MG tablet, Take 10 mg by mouth every evening. , Disp: , Rfl:  .  spironolactone (ALDACTONE) 25 MG tablet, Take 0.5 tablets (12.5 mg total) by mouth daily. (Patient taking differently: Take 12.5 mg by mouth every evening. ), Disp: 45 tablet, Rfl: 3 .  torsemide (DEMADEX) 20 MG tablet, Take 3 tablets (60 mg total) by mouth daily. (Patient taking differently: Take 60 mg by mouth 2 (two) times daily. ), Disp: 180 tablet, Rfl: 3 .  venlafaxine (EFFEXOR) 37.5 MG tablet, Take 37.5 mg by mouth daily. Daily for 7 days then stop, Disp: , Rfl:  .  zolpidem (AMBIEN) 10 MG tablet, Take 10 mg by mouth at bedtime as needed for sleep. For sleep , Disp: , Rfl:  Allergies  Allergen Reactions  . Ceftin Anaphylaxis    Face and throat swell   . Shellfish Allergy Other (See Comments)    Gout exacerbation  . Allopurinol Nausea Only and Other (See Comments)    weakness  . Ativan [Lorazepam] Itching  . Sulfa Antibiotics Itching  . Ultram [Tramadol Hcl] Itching     Social History   Socioeconomic History  . Marital status: Married    Spouse name: Not on file  . Number of children: 2  . Years of education: masters  . Highest education level: Not on file  Occupational History  . Not on file  Social Needs  . Financial resource strain: Not on file  . Food insecurity:    Worry: Not on file    Inability: Not on file  . Transportation needs:    Medical: Not on file    Non-medical: Not on file  Tobacco Use  . Smoking status: Former Smoker    Packs/day: 0.10    Years: 26.00    Pack years: 2.60    Types: Cigarettes    Last attempt to quit: 05/06/2007    Years since quitting: 11.2  . Smokeless tobacco: Never Used  Substance and Sexual Activity  . Alcohol use:  No  . Drug use: No  . Sexual activity: Yes  Lifestyle  . Physical activity:    Days per week: Not on file    Minutes per session: Not on file  . Stress: Not on file  Relationships  . Social connections:    Talks on phone: Not on file    Gets together: Not on file    Attends religious service: Not on file  Active member of club or organization: Not on file    Attends meetings of clubs or organizations: Not on file    Relationship status: Not on file  . Intimate partner violence:    Fear of current or ex partner: Not on file    Emotionally abused: Not on file    Physically abused: Not on file    Forced sexual activity: Not on file  Other Topics Concern  . Not on file  Social History Narrative   Tobacco Use Cigarettes: Former Smoker, Quit in 2008   No Alcohol   No recreational drug use   Diet: Regular/Low Carb   Exercise: None   Occupation: disabled   Education: Research officer, political party, masters   Children: 2   Firearms: No   Therapist, art Use: Always   Former Metallurgist.        Physical Exam      Future Appointments  Date Time Provider [place] [facility]  08/01/2018  7:45 AM [facility] DEVICE REMOTES [facility]  09/28/2018 11:20 AM Bensimhon, Shaune Pascal, MD [facility] None  10/05/2018  1:15 PM Marzetta Board, DPM [facility]  10/18/2018  8:45 AM [facility] DEVICE REMOTES [facility]  12/08/2018 10:15 AM Philemon Kingdom, MD [facility] None     BP 100/60 (BP Location: Right Arm, Patient Position: Sitting, Cuff Size: Large)   Pulse 94   Resp 16   Wt 182 lb 4.8 oz (82.7 kg)   SpO2 98%   BMI 35.60 kg/m   Weight yesterday-183 Last visit weight-184  ATF pt CAO x4 standing in the kitchen. She stated that she's been busy cleaning up and trying to get work done around the house.  Pt denies sob, chest pain and dizziness. She's still weighing daily and keeping record of her weights. She has taken most of her medications with the exception of 2  afternoon and a few night meds.  Rx bottles verified and pill box refilled.    Medication ordered: none Raheen Capili, EMT Paramedic [phone] 07/29/2018    ACTION: Home visit completed

## 2018-07-29 NOTE — Procedures (Signed)
ELECTROENCEPHALOGRAM REPORT  Date of Study: 07/27/2018  Patient's Name: Rhonda [name] MRN: [id number] Date of Birth: 01/03/1951  Referring Provider: Dr. Narda Amber  Clinical History: This is a 68 year old woman with spells of lightheadedness, falls.  Medications: NEURONTIN 300 MG capsule   NEURONTIN 800 MG tablet    TYLENOL 500 MG tablet   DIAMOX 125 MG tablet  WELLBUTRIN XL 150 MG 24 hr tablet  REFRESH PLUS 0.5 % SOLN  COREG 3.125 MG tablet   CELEXA 20 MG tablet  CLEOCIN 300 MG capsule  KLONOPIN 0.5 MG tablet  COLESTID 1 g tablet   ALLEGRA 180 MG tablet  FLONASE 50 MCG/ACT nasal spray   ADVIL,MOTRIN 800 MG tablet  ATROVENT 0.03 % nasal spray   SYNTHROID, LEVOTHROID 100 MCG tablet  MOBIC 15 MG tablet  ZAROXOLYN 2.5 MG tablet  PROAMATINE 10 MG tablet  SINGULAIR 10 MG tablet  MULTIVITAMIN WITH MINERALS TABS tablet   MIRALAX / GLYCOLAX packet   K-DUR,KLOR-CON 20 MEQ tablet  ZOCOR 10 MG tablet  ALDACTONE 25 MG tablet  DEMADEX 20 MG tablet  EFFEXOR 37.5 MG tablet  AMBIEN 10 MG tablet  Technical Summary: A multichannel digital EEG recording measured by the international 10-20 system with electrodes applied with paste and impedances below 5000 ohms performed in our laboratory with EKG monitoring in an awake and asleep patient.  Hyperventilation was not performed. Photic stimulation was performed.  The digital EEG was referentially recorded, reformatted, and digitally filtered in a variety of bipolar and referential montages for optimal display.    Description: The patient is awake and asleep during the recording.  During maximal wakefulness, there is a symmetric, medium voltage 9 Hz posterior dominant rhythm that attenuates with eye opening.  The record is symmetric.  During drowsiness and sleep, there is an increase in theta slowing of the background.  Vertex waves and symmetric sleep spindles were seen.  Photic stimulation did not elicit any abnormalities.  There  were no epileptiform discharges or electrographic seizures seen.    EKG lead showed sinus bradycardia at 66 bpm.  Impression: This awake and asleep EEG is normal.    Clinical Correlation: A normal EEG does not exclude a clinical diagnosis of epilepsy.  If further clinical questions remain, prolonged EEG may be helpful.  Clinical correlation is advised.   Ellouise Newer, M.D.

## 2018-08-01 ENCOUNTER — Ambulatory Visit (INDEPENDENT_AMBULATORY_CARE_PROVIDER_SITE_OTHER): Payer: Medicare HMO

## 2018-08-01 ENCOUNTER — Telehealth: Payer: Self-pay | Admitting: Neurology

## 2018-08-01 ENCOUNTER — Encounter: Payer: Medicare HMO | Admitting: Psychology

## 2018-08-01 ENCOUNTER — Telehealth: Payer: Self-pay

## 2018-08-01 DIAGNOSIS — I5022 Chronic systolic (congestive) heart failure: Secondary | ICD-10-CM

## 2018-08-01 DIAGNOSIS — Z95810 Presence of automatic (implantable) cardiac defibrillator: Secondary | ICD-10-CM

## 2018-08-01 NOTE — Telephone Encounter (Signed)
ICM call  Spoke with patient for monthly ICM follow up.  Advised of remote transmission results. Patient thinks she is limiting salt but the foods but after discussion she is eating foods such as ham and cheese, some soups. Encouraged her to review labels with family to determine how much she is eating.    SYMPTOMS: She has stopped wearing compression stockings and leg are a little swollen. Weight is stable at 178 lbs.   OPTIVOL REPORT: See cc'd ICM chart note for details.  Thoracic impedance suggesting fluid accumulation since 06/27/2018  PRESCRIBED:  Patient has asked that Dr Haroldine Laws confirm she should take Metolazone.   RECOMMENDATIONS:  Advised patient would send information to Dr Haroldine Laws for his approval for recommended plan.   Recheck fluid levels and remote transmission scheduled 08/09/2018.  Dr Haroldine Laws, please advise if you are agreeable with her taking PRN Metolazone 2.5 mg 1 tablet with additional 40 mEq of Potassium.   08/01/2018 Optivol

## 2018-08-01 NOTE — Telephone Encounter (Signed)
Patient called regarding another fall that she had. She said this time she hit the front of her head. She said it is swollen. She was seen at [facility]. She said she did not see her x rays and did not know what they showed.  She also mentioned Compression socks. Please  Call. [place]

## 2018-08-01 NOTE — Progress Notes (Signed)
EPIC Encounter for ICM Monitoring  Patient Name: Rhonda [name] is a 68 y.o. female Date: 08/01/2018 Primary Care Physican: Maurice Small, MD Primary Cardiologist:Bensimhon Electrophysiologist: Santina Evans Pacing: 99.9% LastWeight:183lbs  Today's Weight:178 lbs    Heart Failure questions reviewed, pt has minimal swelling in the legs and has stopped wearing compression stockings.  She had a fall in last month resulting in ER visit.    Thoracic impedanceabnormal suggesting fluid accumulation since 06/27/2018.    Prescribed:Torsemide 20 mg3tablets (60 mg total) by mouth twice a day.Potassium 20 meq 3tablets (60 mEq total)twice a day. Metolazone 2.5mg  1 tablet by mouth if needed for fluid or edema.  Labs: 07/26/2018 Creatinine 1.90, BUN 29, Potassium 3.5, Sodium 141, GFR 30-34 07/18/2018 Creatinine 1.43, BUN 21, Potassium 3.7, Sodium 139, GFR 38-44  06/30/2018 Creatinine 1.75, BUN 27, Potassium 3.0, Sodium 139, GFR 30-35  03/30/2018 Creatinine 1.63, BUN 26, Potassium 3.9, Sodium 139, GFR 32-37 02/10/2018 Creatinine 1.85, BUN 41, Potassium 3.9, Sodium 138, GFR 28-33 10/29/2017 Creatinine 1.45, BUN 24, Potassium 3.9, Sodium 141, GFR 37-43 10/21/2017 Creatinine 1.57, BUN 19, Potassium 3.8, Sodium 140, GFR 34-39 09/23/2017 Creatinine 1.26, BUN 29, Potassium 3.2, Sodium 142, GFR 49-56  Recommendations: Patient is concerned about taking Metolazone and would like for Dr Bensimhon's approval to take the Metolazone.    Advised I will confirm the plan with with Dr Haroldine Laws that she should take Metolazone 1 tablet 2.5 mg with an additional 40 mEq of Potassium.  Phone note sent to Dr Haroldine Laws  Follow-up plan: ICM clinic phone appointment on3/17/2020 (manual send) to recheck fluid levels. Office appointment scheduled 09/28/2018 with Dr.Bensimhon.   Copy of ICM check sent to Dr.Taylor and Dr Haroldine Laws.  3 month ICM trend: 08/01/2018    1 Year ICM trend:        [name], RN 08/01/2018 4:35 PM

## 2018-08-02 ENCOUNTER — Telehealth: Payer: Self-pay | Admitting: *Deleted

## 2018-08-02 NOTE — Telephone Encounter (Signed)
Patient returned call.  Advised Dr Haroldine Laws agreed with the recommendation to take Metolazone 2.5 mg 1 tablet today only with Potassium 20 mEq 2 tablets (40 mEq tota) 30 minutes before taking Torsemide.  She repeated instructions back correctly and will take that today.  Advised if she has any questions to call back.

## 2018-08-02 NOTE — Telephone Encounter (Signed)
Left message giving patient results.  

## 2018-08-02 NOTE — Telephone Encounter (Signed)
Agree. thanks

## 2018-08-02 NOTE — Telephone Encounter (Signed)
-----   Message from Alda Berthold, DO sent at 07/29/2018  4:48 PM EST ----- Please inform patient brain electrical activity is normal. Thanks

## 2018-08-02 NOTE — Telephone Encounter (Signed)
Attempted call to patient and left message for return call.  Message also sent to [place], EMT paramedicine regarding Metoalzone and potassium recommendations in the event she may see patient today.

## 2018-08-02 NOTE — Telephone Encounter (Signed)
Called patient back and left message that I will let Dr. [name] know what has happened and requested for her to call me back if she needs to.

## 2018-08-03 DIAGNOSIS — F3289 Other specified depressive episodes: Secondary | ICD-10-CM | POA: Diagnosis not present

## 2018-08-03 DIAGNOSIS — F0632 Mood disorder due to known physiological condition with major depressive-like episode: Secondary | ICD-10-CM | POA: Diagnosis not present

## 2018-08-03 NOTE — Progress Notes (Signed)
Dr Haroldine Laws agreed with plan and patient notified of recommendations. She verbalized instructions and repeated back correctly.  Bensimhon, Shaune Pascal, MD  Note    Agree  thanks

## 2018-08-05 ENCOUNTER — Telehealth: Payer: Self-pay | Admitting: Neurology

## 2018-08-05 ENCOUNTER — Other Ambulatory Visit (HOSPITAL_COMMUNITY): Payer: Self-pay

## 2018-08-05 ENCOUNTER — Encounter (HOSPITAL_COMMUNITY): Payer: Self-pay

## 2018-08-05 ENCOUNTER — Other Ambulatory Visit (HOSPITAL_COMMUNITY): Payer: Self-pay | Admitting: Student

## 2018-08-05 NOTE — Telephone Encounter (Signed)
Patient wants to speak to someone about the fall she had on Monday (she thinks ) she states it was on the top of her head and is having headaches and did not go to the hospital this time. She has talk to her PCP about the fall but she wants to talk to someone in our office

## 2018-08-05 NOTE — Telephone Encounter (Signed)
Patient was calling back 

## 2018-08-05 NOTE — Telephone Encounter (Signed)
Called and LMOVM for Pt to call back

## 2018-08-05 NOTE — Progress Notes (Signed)
Paramedicine Encounter    Patient ID: Rhonda [name], female    DOB: Sep 30, 1950, 68 y.o.   MRN: [id number]    Patient Care Team: Maurice Small, MD as PCP - General (Family Medicine) [name], LCSW as Social Worker (Licensed Clinical Social Worker)  Patient Active Problem List   Diagnosis Date Noted  . Orthostatic hypotension 07/28/2017  . SVD (spontaneous vaginal delivery)   . Peripheral neuropathy   . On home oxygen therapy   . Migraines   . Left bundle branch block   . Hypothyroidism   . Hypertension   . Hyperlipidemia   . Heart murmur   . GERD (gastroesophageal reflux disease)   . Exertional shortness of breath   . Depression   . Complication of anesthesia   . Back pain   . Arthritis   . Anxiety   . Anemia   . HTN (hypertension) 09/14/2013  . Chronic respiratory failure ([place]) 09/14/2013  . 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 Morbid obesity ([place]) 11/01/2012  . Chronic systolic heart failure ([place]) 10/27/2012  . S/P right TKA 05/10/2012  . Endometrial polyp 01/20/2012  . Breast cancer, stage 1 ([place]) 03/26/2011  . Unspecified vitamin D deficiency 03/26/2011    Current Outpatient Medications:  .  acetaZOLAMIDE (DIAMOX) 125 MG tablet, TAKE 1 TABLET(125 MG) BY MOUTH DAILY (Patient taking differently: Take 125 mg by mouth daily. ), Disp: 30 tablet, Rfl: 5 .  buPROPion (WELLBUTRIN XL) 150 MG 24 hr tablet, Take 450 mg by mouth every morning. , Disp: , Rfl:  .  carvedilol (COREG) 3.125 MG tablet, Take 3.125 mg by mouth 2 (two) times daily with a meal., Disp: , Rfl:  .  citalopram (CELEXA) 20 MG tablet, Take 20 mg by mouth daily., Disp: , Rfl:  .  fexofenadine (ALLEGRA) 180 MG tablet, Take 180 mg by mouth 2 (two) times daily., Disp: , Rfl:  .  gabapentin (NEURONTIN) 300 MG capsule, Take 300 mg by mouth 3 (three) times daily. , Disp: , Rfl:  .  levothyroxine (SYNTHROID, LEVOTHROID) 100 MCG tablet, TAKE 1 TABLET(100 MCG) BY MOUTH  DAILY, Disp: 45 tablet, Rfl: 3 .  midodrine (PROAMATINE) 10 MG tablet, Take 1 tablet (10 mg total) by mouth 3 (three) times daily with meals., Disp: 90 tablet, Rfl: 3 .  montelukast (SINGULAIR) 10 MG tablet, Take 10 mg by mouth at bedtime. , Disp: , Rfl: 1 .  Multiple Vitamin (MULTIVITAMIN WITH MINERALS) TABS tablet, Take 1 tablet by mouth daily. One-A-Day 50+, Disp: , Rfl:  .  simvastatin (ZOCOR) 10 MG tablet, Take 10 mg by mouth every evening. , Disp: , Rfl:  .  torsemide (DEMADEX) 20 MG tablet, Take 3 tablets (60 mg total) by mouth daily. (Patient taking differently: Take 60 mg by mouth 2 (two) times daily. ), Disp: 180 tablet, Rfl: 3 .  zolpidem (AMBIEN) 10 MG tablet, Take 10 mg by mouth at bedtime as needed for sleep. For sleep , Disp: , Rfl:  .  acetaminophen (TYLENOL) 500 MG tablet, Take 1,000-1,500 mg by mouth every 6 (six) hours as needed (for pain.)., Disp: , Rfl:  .  carboxymethylcellulose (REFRESH PLUS) 0.5 % SOLN, Place 1 drop into both eyes 3 (three) times daily as needed (dry eyes)., Disp: , Rfl:  .  clindamycin (CLEOCIN) 300 MG capsule, Take 600 mg by mouth See admin instructions. Take 2 capsules (600 mg) by mouth 1 hour prior to dental procedure., Disp: , Rfl:  .  clonazePAM (KLONOPIN) 0.5 MG tablet, Take 0.5 mg by mouth 3 (three) times daily as needed for anxiety. , Disp: , Rfl:  .  colestipol (COLESTID) 1 g tablet, Take 2 g by mouth 2 (two) times daily. , Disp: , Rfl:  .  fluticasone (FLONASE) 50 MCG/ACT nasal spray, Place 2 sprays into both nostrils daily as needed for allergies. , Disp: , Rfl: 7 .  gabapentin (NEURONTIN) 800 MG tablet, Take 800 mg by mouth at bedtime as needed (neuropathy). , Disp: , Rfl:  .  ibuprofen (ADVIL,MOTRIN) 800 MG tablet, Take 800 mg by mouth 3 (three) times daily., Disp: , Rfl:  .  ipratropium (ATROVENT) 0.03 % nasal spray, Place 2 sprays into both nostrils every 12 (twelve) hours as needed for rhinitis., Disp: , Rfl:  .  meloxicam (MOBIC) 15 MG  tablet, Take 15 mg by mouth daily. Daily with food for 10 days then PRN, Disp: , Rfl:  .  metolazone (ZAROXOLYN) 2.5 MG tablet, take 1 tablet by mouth if needed for FLUID OR EDEMA (Patient taking differently: Take 2.5 mg by mouth daily as needed (fluid retention/edema (weight gain of greater than 6 lbs)). ), Disp: 30 tablet, Rfl: 2 .  polyethylene glycol (MIRALAX / GLYCOLAX) packet, Take 17 g by mouth daily as needed for moderate constipation., Disp: , Rfl:  .  potassium chloride SA (K-DUR,KLOR-CON) 20 MEQ tablet, Take 3 tablets (60 mEq total) by mouth 2 (two) times daily., Disp: 180 tablet, Rfl: 11 .  spironolactone (ALDACTONE) 25 MG tablet, Take 0.5 tablets (12.5 mg total) by mouth every evening., Disp: 45 tablet, Rfl: 1 .  venlafaxine (EFFEXOR) 37.5 MG tablet, Take 37.5 mg by mouth daily. Daily for 7 days then stop, Disp: , Rfl:  Allergies  Allergen Reactions  . Ceftin Anaphylaxis    Face and throat swell   . Shellfish Allergy Other (See Comments)    Gout exacerbation  . Allopurinol Nausea Only and Other (See Comments)    weakness  . Ativan [Lorazepam] Itching  . Sulfa Antibiotics Itching  . Ultram [Tramadol Hcl] Itching     Social History   Socioeconomic History  . Marital status: Married    Spouse name: Not on file  . Number of children: 2  . Years of education: masters  . Highest education level: Not on file  Occupational History  . Not on file  Social Needs  . Financial resource strain: Not on file  . Food insecurity:    Worry: Not on file    Inability: Not on file  . Transportation needs:    Medical: Not on file    Non-medical: Not on file  Tobacco Use  . Smoking status: Former Smoker    Packs/day: 0.10    Years: 26.00    Pack years: 2.60    Types: Cigarettes    Last attempt to quit: 05/06/2007    Years since quitting: 11.2  . Smokeless tobacco: Never Used  Substance and Sexual Activity  . Alcohol use: No  . Drug use: No  . Sexual activity: Yes  Lifestyle   . Physical activity:    Days per week: Not on file    Minutes per session: Not on file  . Stress: Not on file  Relationships  . Social connections:    Talks on phone: Not on file    Gets together: Not on file    Attends religious service: Not on file    Active member of club or organization: Not on  file    Attends meetings of clubs or organizations: Not on file    Relationship status: Not on file  . Intimate partner violence:    Fear of current or ex partner: Not on file    Emotionally abused: Not on file    Physically abused: Not on file    Forced sexual activity: Not on file  Other Topics Concern  . Not on file  Social History Narrative   Tobacco Use Cigarettes: Former Smoker, Quit in 2008   No Alcohol   No recreational drug use   Diet: Regular/Low Carb   Exercise: None   Occupation: disabled   Education: Research officer, political party, masters   Children: 2   Firearms: No   Therapist, art Use: Always   Former Metallurgist.        Physical Exam Pulmonary:     Effort: Pulmonary effort is normal. No respiratory distress.     Breath sounds: No wheezing or rales.  Abdominal:     Tenderness: There is no abdominal tenderness. There is no guarding.  Musculoskeletal:        General: No swelling.     Right lower leg: No edema.     Left lower leg: No edema.  Skin:    General: Skin is warm and dry.         Future Appointments  Date Time Provider [place]  08/09/2018  9:30 AM [facility] DEVICE REMOTES [facility]  09/28/2018 11:20 AM Bensimhon, Shaune Pascal, MD [facility] None  10/05/2018  1:15 PM Marzetta Board, DPM [facility]  10/18/2018  8:45 AM [facility] DEVICE REMOTES [facility]  12/08/2018 10:15 AM Philemon Kingdom, MD [facility] None     BP 118/66 (BP Location: Right Arm, Patient Position: Sitting, Cuff Size: Large)   Pulse 72   Resp 16   Wt 177 lb (80.3 kg)   SpO2 98%   BMI 34.57 kg/m   Weight yesterday-pt weighed and wrote the  results down but she cant remember where she "put it" Last visit weight-182  ATF pt CAO x4 sitting in the kitchen c/o anxiety.  She stated that she has been worried about the recent outbreaks of the coronavirus, because her husband has to work.  She has taken all of her medication including the clonzapam.  Pt stated that she has fallen twice in the past 3 weeks. She stated that the last fall, she hit the left side of her head.  She stated that her head is hurting a little on the right side "but she doesn't know if it's stress or not".  rx bottles verified and pill box refilled including the blue box (levothryoxin).     Medication ordered: Midodrine Acetazolamide Spirolactone - no refills  [place] Courts, EMT Paramedic [phone] 08/05/2018    ACTION: Home visit completed

## 2018-08-08 ENCOUNTER — Telehealth: Payer: Self-pay | Admitting: Neurology

## 2018-08-08 NOTE — Telephone Encounter (Signed)
See next note

## 2018-08-08 NOTE — Telephone Encounter (Signed)
Patient is calling in again. Thanks!

## 2018-08-08 NOTE — Telephone Encounter (Signed)
pATIENT IS CALLING AND WANTING TO SPEAK WITH YOU. pLEASE CALL HER BACK AT [phone]. THANKS!

## 2018-08-08 NOTE — Telephone Encounter (Signed)
I spoke with patient and she is going to come in for a follow up with Dr. [name].

## 2018-08-09 ENCOUNTER — Other Ambulatory Visit: Payer: Self-pay

## 2018-08-09 ENCOUNTER — Telehealth: Payer: Self-pay

## 2018-08-09 NOTE — Telephone Encounter (Signed)
Left message for patient to remind of missed remote transmission.  

## 2018-08-10 ENCOUNTER — Emergency Department (HOSPITAL_COMMUNITY)
Admission: EM | Admit: 2018-08-10 | Discharge: 2018-08-10 | Disposition: A | Discharge status: Home / Self Care | Payer: Medicare HMO | Attending: Emergency Medicine | Admitting: Emergency Medicine

## 2018-08-10 ENCOUNTER — Encounter (HOSPITAL_COMMUNITY): Payer: Self-pay | Admitting: *Deleted

## 2018-08-10 ENCOUNTER — Telehealth: Payer: Self-pay | Admitting: Neurology

## 2018-08-10 ENCOUNTER — Other Ambulatory Visit: Payer: Self-pay

## 2018-08-10 ENCOUNTER — Emergency Department (HOSPITAL_COMMUNITY): Payer: Medicare HMO

## 2018-08-10 DIAGNOSIS — Y939 Activity, unspecified: Secondary | ICD-10-CM | POA: Insufficient documentation

## 2018-08-10 DIAGNOSIS — Y929 Unspecified place or not applicable: Secondary | ICD-10-CM | POA: Insufficient documentation

## 2018-08-10 DIAGNOSIS — R296 Repeated falls: Secondary | ICD-10-CM

## 2018-08-10 DIAGNOSIS — S199XXA Unspecified injury of neck, initial encounter: Secondary | ICD-10-CM | POA: Diagnosis not present

## 2018-08-10 DIAGNOSIS — Z87891 Personal history of nicotine dependence: Secondary | ICD-10-CM | POA: Insufficient documentation

## 2018-08-10 DIAGNOSIS — Y999 Unspecified external cause status: Secondary | ICD-10-CM | POA: Insufficient documentation

## 2018-08-10 DIAGNOSIS — S0990XA Unspecified injury of head, initial encounter: Secondary | ICD-10-CM | POA: Diagnosis not present

## 2018-08-10 DIAGNOSIS — R51 Headache: Secondary | ICD-10-CM | POA: Diagnosis not present

## 2018-08-10 DIAGNOSIS — E119 Type 2 diabetes mellitus without complications: Secondary | ICD-10-CM | POA: Insufficient documentation

## 2018-08-10 DIAGNOSIS — W19XXXA Unspecified fall, initial encounter: Secondary | ICD-10-CM | POA: Diagnosis not present

## 2018-08-10 DIAGNOSIS — I110 Hypertensive heart disease with heart failure: Secondary | ICD-10-CM | POA: Insufficient documentation

## 2018-08-10 DIAGNOSIS — E039 Hypothyroidism, unspecified: Secondary | ICD-10-CM | POA: Insufficient documentation

## 2018-08-10 DIAGNOSIS — F0390 Unspecified dementia without behavioral disturbance: Secondary | ICD-10-CM | POA: Diagnosis not present

## 2018-08-10 DIAGNOSIS — Z79899 Other long term (current) drug therapy: Secondary | ICD-10-CM | POA: Insufficient documentation

## 2018-08-10 DIAGNOSIS — I5022 Chronic systolic (congestive) heart failure: Secondary | ICD-10-CM | POA: Insufficient documentation

## 2018-08-10 NOTE — ED Notes (Signed)
Pt asking for something for her headache

## 2018-08-10 NOTE — ED Triage Notes (Signed)
The pt is c/o a headache and falls for the past 3-4 days  She sees a neurologist  Who referred her here

## 2018-08-10 NOTE — ED Provider Notes (Signed)
[facility] EMERGENCY DEPARTMENT Provider Note   CSN: [id number] Arrival date & time: 08/10/18  1901    History   Chief Complaint Chief Complaint  Patient presents with   Headache    HPI Rhonda [name] is a 68 y.o. female.     68 year old female with past medical history including sCHF, T2DM, HTN, HLD, diabetic neuropathy, memory problems who p/w falls.  Patient has history of frequent falls for which she has recently seen her neurologist.  She has been referred for neurocognitive testing for memory problems.  She reports a fall a few days ago during which she bumped the side of her head.  Last night, has been states that she was bending forward to tie her shoes and fell forward, striking her head and face and sustaining a mild nosebleed.  She has continued to have a headache today.  She reports mild neck soreness.  No extremity injury or weakness.  She denies any urinary symptoms, fevers, URI symptoms.  She is compliant with medications.  The history is provided by the patient and the spouse.  Headache    Past Medical History:  Diagnosis Date   Anemia    Anxiety    Arthritis    RIGHT KNEE ARTHRITIS AND PAIN-PT TOLD BONE ON BONE   Asthma    Back pain    DISK PROBLEM   Breast cancer, stage 1 ([place]) 03/26/2011   left-FINISHED CHEMO  AND RADIATION   Cardiomyopathy    [name]   Chronic systolic heart failure (HCC)    a) NICM b) ECHO (03/2013) EF 20-25% c) ECHO (09/2013) EF 45-50%, grade I DD   Complication of anesthesia    PT STATES HER B/P LOW AFTER ONE OF HER SURGERIES--SHE ATTRIBUTES TO LYING FLAT   Depression    Diabetes mellitus    "diet controlled" (05/04/2103)   Exertional shortness of breath    GERD (gastroesophageal reflux disease)    Gout    Heart murmur    Hyperlipidemia    Hypertension    Hypothyroidism    Left bundle branch block    s/p CRT-D (04/2013)   Migraines    On home oxygen  therapy    "2L suppose to be q night" (05/03/2013)   Peripheral neuropathy    feet   SVD (spontaneous vaginal delivery)    x 2   Unspecified vitamin D deficiency 03/26/2011   does not take meds    Patient Active Problem List   Diagnosis Date Noted   Orthostatic hypotension 07/28/2017   SVD (spontaneous vaginal delivery)    Peripheral neuropathy    On home oxygen therapy    Migraines    Left bundle branch block    Hypothyroidism    Hypertension    Hyperlipidemia    Heart murmur    GERD (gastroesophageal reflux disease)    Exertional shortness of breath    Depression    Complication of anesthesia    Back pain    Arthritis    Anxiety    Anemia    HTN (hypertension) 09/14/2013   Chronic respiratory failure ([place]) 09/14/2013   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   Morbid obesity ([place]) 88/50/2774   Chronic systolic heart failure ([place]) 10/27/2012   S/P right TKA 05/10/2012   Endometrial polyp 01/20/2012   Breast cancer, stage 1 ([place]) 03/26/2011   Unspecified vitamin D deficiency 03/26/2011    Past Surgical History:  Procedure Laterality Date  BI-VENTRICULAR IMPLANTABLE CARDIOVERTER DEFIBRILLATOR N/A 05/03/2013   Procedure: BI-VENTRICULAR IMPLANTABLE CARDIOVERTER DEFIBRILLATOR  (CRT-D);  Surgeon: Evans Lance, MD;  Location: [facility] CATH LAB;  Service: Cardiovascular;  Laterality: N/A;   BI-VENTRICULAR IMPLANTABLE CARDIOVERTER DEFIBRILLATOR  (CRT-D)  05/03/2013   MDT CRTD implanted by Dr Lovena Le for non ischemic cardiomyopathy   BIOPSY  05/31/2018   Procedure: BIOPSY;  Surgeon: Ronnette Juniper, MD;  Location: [facility] ENDOSCOPY;  Service: Gastroenterology;;   BREAST LUMPECTOMY Left 07/28/2010   CARDIAC CATHETERIZATION  2010   NORMAL CORONARY ARTERIES   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--11 SURGERIES     PT HAS REMOVABLE SPEECH BULB-TAKES IT OUT BEFORE HER SURGERY   COLONOSCOPY     COLONOSCOPY N/A 05/31/2018    Procedure: COLONOSCOPY;  Surgeon: Ronnette Juniper, MD;  Location: [facility]OS[facility];  Service: Gastroenterology;  Laterality: N/A;   HYSTEROSCOPY W/D&C  01/20/2012   Procedure: DILATATION AND CURETTAGE /HYSTEROSCOPY;  Surgeon: Margarette Asal, MD;  Location: [facility];  Service: Gynecology;  Laterality: N/A;  with trueclear   PORT-A-CATH REMOVAL  09/23/2011   Procedure: REMOVAL PORT-A-CATH;  Surgeon: Rolm Bookbinder, MD;  Location: [facility];  Service: General;  Laterality: N/A;  Port Removal   PORTACATH PLACEMENT  2012   TONSILLECTOMY  1960's   TOTAL KNEE ARTHROPLASTY  05/10/2012   Procedure: TOTAL KNEE ARTHROPLASTY;  Surgeon: Mauri Pole, MD;  Location: [facility];  Service: Orthopedics;  Laterality: Right;   WISDOM TOOTH EXTRACTION       OB History   No obstetric history on file.      Home Medications    Prior to Admission medications   Medication Sig Start Date End Date Taking? Authorizing Provider  acetaZOLAMIDE (DIAMOX) 125 MG tablet TAKE 1 TABLET(125 MG) BY MOUTH DAILY Patient taking differently: Take 125 mg by mouth daily.  04/04/18  Yes Patel, Donika K, DO  buPROPion (WELLBUTRIN XL) 150 MG 24 hr tablet Take 450 mg by mouth every morning.    Yes [provider]  carboxymethylcellulose (REFRESH PLUS) 0.5 % SOLN Place 1 drop into both eyes 3 (three) times daily as needed (dry eyes).   Yes [provider]  carvedilol (COREG) 3.125 MG tablet Take 3.125 mg by mouth 2 (two) times daily with a meal.   Yes [provider]  citalopram (CELEXA) 20 MG tablet Take 20 mg by mouth daily.   Yes [provider]  clonazePAM (KLONOPIN) 0.5 MG tablet Take 0.5 mg by mouth 3 (three) times daily as needed for anxiety.    Yes [provider]  colestipol (COLESTID) 1 g tablet Take 2 g by mouth 2 (two) times daily.    Yes [provider]  fexofenadine (ALLEGRA) 180 MG tablet Take 180 mg by mouth 2 (two) times daily.   Yes [provider]  gabapentin  (NEURONTIN) 300 MG capsule Take 300 mg by mouth 3 (three) times daily.    Yes [provider]  gabapentin (NEURONTIN) 800 MG tablet Take 800 mg by mouth at bedtime as needed (neuropathy).    Yes [provider]  ipratropium (ATROVENT) 0.03 % nasal spray Place 2 sprays into both nostrils 2 (two) times daily.   Yes [provider]  levothyroxine (SYNTHROID, LEVOTHROID) 100 MCG tablet TAKE 1 TABLET(100 MCG) BY MOUTH DAILY Patient taking differently: Take 100 mcg by mouth daily.  06/10/18  Yes Philemon Kingdom, MD  metolazone (ZAROXOLYN) 2.5 MG tablet take 1 tablet by mouth if needed for FLUID OR EDEMA Patient taking  differently: Take 2.5 mg by mouth daily as needed (fluid retention/edema (weight gain of greater than 6 lbs)).  05/07/17  Yes Larey Dresser, MD  midodrine (PROAMATINE) 10 MG tablet Take 1 tablet (10 mg total) by mouth 3 (three) times daily with meals. 07/01/18  Yes Patel, Donika K, DO  montelukast (SINGULAIR) 10 MG tablet Take 10 mg by mouth at bedtime.  09/28/16  Yes [provider]  polyethylene glycol (MIRALAX / GLYCOLAX) packet Take 17 g by mouth daily as needed for moderate constipation.   Yes [provider]  simvastatin (ZOCOR) 10 MG tablet Take 10 mg by mouth every evening.    Yes [provider]  spironolactone (ALDACTONE) 25 MG tablet Take 0.5 tablets (12.5 mg total) by mouth every evening. 08/05/18  Yes Shirley Friar, PA-C  torsemide (DEMADEX) 20 MG tablet Take 3 tablets (60 mg total) by mouth daily. Patient taking differently: Take 60 mg by mouth 2 (two) times daily.  04/02/18  Yes Georgiana Shore, NP  zolpidem (AMBIEN) 10 MG tablet Take 10 mg by mouth at bedtime as needed for sleep. For sleep    Yes [provider]  clindamycin (CLEOCIN) 300 MG capsule Take 600 mg by mouth See admin instructions. Take 2 capsules (600 mg) by mouth 1 hour prior to dental procedure.    [provider]  potassium chloride  SA (K-DUR,KLOR-CON) 20 MEQ tablet Take 3 tablets (60 mEq total) by mouth 2 (two) times daily. Patient not taking: Reported on 08/10/2018 10/29/17   Conrad Lima, NP    Family History Family History  Problem Relation Age of Onset   Alzheimer's disease Mother    CVA Mother    Hypertension Mother    Cancer - Colon Father        late 52   Birth defects Paternal Uncle     Social History Social History   Tobacco Use   Smoking status: Former Smoker    Packs/day: 0.10    Years: 26.00    Pack years: 2.60    Types: Cigarettes    Last attempt to quit: 05/06/2007    Years since quitting: 11.2   Smokeless tobacco: Never Used  Substance Use Topics   Alcohol use: No   Drug use: No     Allergies   Ceftin; Shellfish allergy; Allopurinol; Ativan [lorazepam]; Sulfa antibiotics; and Ultram [tramadol hcl]   Review of Systems Review of Systems  Unable to perform ROS: Dementia  Neurological: Positive for headaches.     Physical Exam Updated Vital Signs BP 130/62    Pulse 73    Temp 97.7 F (36.5 C)    Resp 18    Ht 5' (1.524 m)    Wt 78.5 kg    SpO2 99%    BMI 33.79 kg/m   Physical Exam Vitals signs and nursing note reviewed.  Constitutional:      General: She is not in acute distress.    Appearance: She is well-developed.     Comments: Awake, alert  HENT:     Head: Normocephalic and atraumatic.     Mouth/Throat:     Mouth: Mucous membranes are moist.     Pharynx: Oropharynx is clear.     Comments: Repaired cleft lip/palate Eyes:     Extraocular Movements: Extraocular movements intact.     Conjunctiva/sclera: Conjunctivae normal.     Pupils: Pupils are equal, round, and reactive to light.  Neck:     Musculoskeletal: Neck supple.  Cardiovascular:     Rate and Rhythm: Normal rate and regular rhythm.     Heart sounds: Normal heart sounds. No murmur.  Pulmonary:     Effort: Pulmonary effort is normal. No respiratory distress.     Breath sounds: Normal breath  sounds.  Abdominal:     General: Bowel sounds are normal. There is no distension.     Palpations: Abdomen is soft.     Tenderness: There is no abdominal tenderness.  Skin:    General: Skin is warm and dry.  Neurological:     Mental Status: She is alert and oriented to person, place, and time.     Cranial Nerves: No cranial nerve deficit.     Motor: No abnormal muscle tone.     Deep Tendon Reflexes: Reflexes are normal and symmetric.     Comments: Fluent speech, normal finger-to-nose testing, negative pronator drift, no clonus 5/5 strength and normal sensation x all 4 extremities Slow, steady gait  Psychiatric:        Mood and Affect: Mood is anxious.        Thought Content: Thought content normal.        Judgment: Judgment normal.      ED Treatments / Results  Labs (all labs ordered are listed, but only abnormal results are displayed) Labs Reviewed - No data to display  EKG None  Radiology Ct Head Wo Contrast  Result Date: 08/10/2018 CLINICAL DATA:  Headache and falls over the past 3-4 days. EXAM: CT HEAD WITHOUT CONTRAST CT CERVICAL SPINE WITHOUT CONTRAST TECHNIQUE: Multidetector CT imaging of the head and cervical spine was performed following the standard protocol without intravenous contrast. Multiplanar CT image reconstructions of the cervical spine were also generated. COMPARISON:  CT head dated July 26, 2018. CT cervical spine dated January 30, 2008. FINDINGS: CT HEAD FINDINGS Brain: No evidence of acute infarction, hemorrhage, hydrocephalus, extra-axial collection or mass lesion/mass effect. Unchanged mild atrophy. Unchanged bulky parafalcine calcifications. Vascular: No hyperdense vessel or unexpected calcification. Skull: Negative for fracture or focal lesion. Sinuses/Orbits: No acute finding. Other: None. CT CERVICAL SPINE FINDINGS Alignment: Straightening of the normal cervical lordosis. No traumatic malalignment. Skull base and vertebrae: No acute fracture. No primary  bone lesion or focal pathologic process. Soft tissues and spinal canal: No prevertebral fluid or swelling. No visible canal hematoma. Disc levels:  Mild disc bulging from C3-C4 through C6-C7. Upper chest: Negative. Other: None. IMPRESSION: 1.  No acute intracranial abnormality. 2.  No acute cervical spine fracture. Electronically Signed   By: Titus Dubin M.D.   On: 08/10/2018 20:21   Ct Cervical Spine Wo Contrast  Result Date: 08/10/2018 CLINICAL DATA:  Headache and falls over the past 3-4 days. EXAM: CT HEAD WITHOUT CONTRAST CT CERVICAL SPINE WITHOUT CONTRAST TECHNIQUE: Multidetector CT imaging of the head and cervical spine was performed following the standard protocol without intravenous contrast. Multiplanar CT image reconstructions of the cervical spine were also generated. COMPARISON:  CT head dated July 26, 2018. CT cervical spine dated January 30, 2008. FINDINGS: CT HEAD FINDINGS Brain: No evidence of acute infarction, hemorrhage, hydrocephalus, extra-axial collection or mass lesion/mass effect. Unchanged mild atrophy. Unchanged bulky parafalcine calcifications. Vascular: No hyperdense vessel or unexpected calcification. Skull: Negative for fracture or focal lesion. Sinuses/Orbits: No acute finding. Other: None. CT CERVICAL SPINE FINDINGS Alignment: Straightening of the normal cervical lordosis. No traumatic malalignment. Skull base and vertebrae: No acute fracture. No primary bone lesion or focal pathologic process. Soft tissues and spinal  canal: No prevertebral fluid or swelling. No visible canal hematoma. Disc levels:  Mild disc bulging from C3-C4 through C6-C7. Upper chest: Negative. Other: None. IMPRESSION: 1.  No acute intracranial abnormality. 2.  No acute cervical spine fracture. Electronically Signed   By: Titus Dubin M.D.   On: 08/10/2018 20:21    Procedures Procedures (including critical care time)  Medications Ordered in ED Medications - No data to display   Initial  Impression / Assessment and Plan / ED Course  I have reviewed the triage vital signs and the nursing notes.  Pertinent labs & imaging results that were available during my care of the patient were reviewed by me and considered in my medical decision making (see chart for details).       Neuro intact on exam. Ambulatory.  CT head and c-spine obtained due to multiple head injuries.  Imaging negative for acute injury.  I reviewed her in recent neurology clinic visit.  It sounds like her frequent falls are multifactorial, due in part to polypharmacy, hypotension and drop attacks, and peripheral neuropathy.  She already has follow-up established for this issue and I do not feel that she needs any further work-up at this time as she is denying any infectious symptoms or other sudden change in chronic symptoms.  I have extensively reviewed return precautions.  Final Clinical Impressions(s) / ED Diagnoses   Final diagnoses:  Multiple falls  Injury of head, initial encounter    ED Discharge Orders    None       Swara Donze, Wenda Overland, MD 08/10/18 2134

## 2018-08-10 NOTE — Telephone Encounter (Signed)
Husband came into office and asked about sooner appt for her. Please advise him as we were instructed to not fill schedules. She fell a couple of days ago and is not wanting to go to the ER for this. Thanks!

## 2018-08-11 NOTE — Telephone Encounter (Signed)
Noted  

## 2018-08-11 NOTE — Telephone Encounter (Signed)
Spoke with pt's husband while he was in the office in regards to documentation below.  Husband states that pt fell a few days ago and has had ringing in her ear ever since then.  Husband asked about doing EEG.  I advised that EEG would not be beneficial, however pt might need MRI.  Advised husband to take pt to Urgent Care facility, and follow instructions if they recommend ED visit.    It appears that husband took pt to ED after leaving office

## 2018-08-12 ENCOUNTER — Other Ambulatory Visit (HOSPITAL_COMMUNITY): Payer: Self-pay

## 2018-08-12 ENCOUNTER — Telehealth (HOSPITAL_COMMUNITY): Payer: Self-pay | Admitting: Licensed Clinical Social Worker

## 2018-08-12 ENCOUNTER — Telehealth (HOSPITAL_COMMUNITY): Payer: Self-pay

## 2018-08-12 NOTE — Telephone Encounter (Signed)
CSW reached out to pt to check in regarding food and medication status at this time. Pt states they have sufficient supplies at this time and has family who can assist if need be.  CSW encouraged pt to reach out with any concerns and will continue to follow and assist as needed  [name], [place] Worker [facility] [phone]

## 2018-08-12 NOTE — Telephone Encounter (Signed)
I called to advise Rhonda [name] that I'm running behind and that I will be there around 4pm. Her husband answered the phone and stated that she was in the shower, he would let her know.

## 2018-08-12 NOTE — Progress Notes (Signed)
Its not about the jewery, its the commiment he made to you, back in the day they didn't have rings Paramedicine Encounter    Patient ID: Rhonda [name], female    DOB: [birth year], 68 y.o.   MRN: [id number]    Patient Care Team: Maurice Small, MD as PCP - General (Family Medicine) [name], LCSW as Social Worker (Licensed Clinical Social Worker)  Patient Active Problem List   Diagnosis Date Noted  . Orthostatic hypotension 07/28/2017  . SVD (spontaneous vaginal delivery)   . Peripheral neuropathy   . On home oxygen therapy   . Migraines   . Left bundle branch block   . Hypothyroidism   . Hypertension   . Hyperlipidemia   . Heart murmur   . GERD (gastroesophageal reflux disease)   . Exertional shortness of breath   . Depression   . Complication of anesthesia   . Back pain   . Arthritis   . Anxiety   . Anemia   . HTN (hypertension) 09/14/2013  . Chronic respiratory failure ([place]) 09/14/2013  . 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 Morbid obesity ([place]) 11/01/2012  . Chronic systolic heart failure ([place]) 10/27/2012  . S/P right TKA 05/10/2012  . Endometrial polyp 01/20/2012  . Breast cancer, stage 1 ([place]) 03/26/2011  . Unspecified vitamin D deficiency 03/26/2011    Current Outpatient Medications:  .  acetaZOLAMIDE (DIAMOX) 125 MG tablet, TAKE 1 TABLET(125 MG) BY MOUTH DAILY (Patient taking differently: Take 125 mg by mouth daily. ), Disp: 30 tablet, Rfl: 5 .  buPROPion (WELLBUTRIN XL) 150 MG 24 hr tablet, Take 450 mg by mouth every morning. , Disp: , Rfl:  .  carboxymethylcellulose (REFRESH PLUS) 0.5 % SOLN, Place 1 drop into both eyes 3 (three) times daily as needed (dry eyes)., Disp: , Rfl:  .  carvedilol (COREG) 3.125 MG tablet, Take 3.125 mg by mouth 2 (two) times daily with a meal., Disp: , Rfl:  .  citalopram (CELEXA) 20 MG tablet, Take 20 mg by mouth daily., Disp: , Rfl:  .  clindamycin (CLEOCIN) 300 MG capsule, Take 600 mg  by mouth See admin instructions. Take 2 capsules (600 mg) by mouth 1 hour prior to dental procedure., Disp: , Rfl:  .  clonazePAM (KLONOPIN) 0.5 MG tablet, Take 0.5 mg by mouth 3 (three) times daily as needed for anxiety. , Disp: , Rfl:  .  colestipol (COLESTID) 1 g tablet, Take 2 g by mouth 2 (two) times daily. , Disp: , Rfl:  .  fexofenadine (ALLEGRA) 180 MG tablet, Take 180 mg by mouth 2 (two) times daily., Disp: , Rfl:  .  gabapentin (NEURONTIN) 300 MG capsule, Take 300 mg by mouth 3 (three) times daily. , Disp: , Rfl:  .  gabapentin (NEURONTIN) 800 MG tablet, Take 800 mg by mouth at bedtime as needed (neuropathy). , Disp: , Rfl:  .  ipratropium (ATROVENT) 0.03 % nasal spray, Place 2 sprays into both nostrils 2 (two) times daily., Disp: , Rfl:  .  levothyroxine (SYNTHROID, LEVOTHROID) 100 MCG tablet, TAKE 1 TABLET(100 MCG) BY MOUTH DAILY (Patient taking differently: Take 100 mcg by mouth daily. ), Disp: 45 tablet, Rfl: 3 .  metolazone (ZAROXOLYN) 2.5 MG tablet, take 1 tablet by mouth if needed for FLUID OR EDEMA (Patient taking differently: Take 2.5 mg by mouth daily as needed (fluid retention/edema (weight gain of greater than 6 lbs)). ), Disp: 30 tablet, Rfl: 2 .  midodrine (PROAMATINE) 10 MG  tablet, Take 1 tablet (10 mg total) by mouth 3 (three) times daily with meals., Disp: 90 tablet, Rfl: 3 .  montelukast (SINGULAIR) 10 MG tablet, Take 10 mg by mouth at bedtime. , Disp: , Rfl: 1 .  polyethylene glycol (MIRALAX / GLYCOLAX) packet, Take 17 g by mouth daily as needed for moderate constipation., Disp: , Rfl:  .  potassium chloride SA (K-DUR,KLOR-CON) 20 MEQ tablet, Take 3 tablets (60 mEq total) by mouth 2 (two) times daily. (Patient not taking: Reported on 08/10/2018), Disp: 180 tablet, Rfl: 11 .  simvastatin (ZOCOR) 10 MG tablet, Take 10 mg by mouth every evening. , Disp: , Rfl:  .  spironolactone (ALDACTONE) 25 MG tablet, Take 0.5 tablets (12.5 mg total) by mouth every evening., Disp: 45 tablet,  Rfl: 1 .  torsemide (DEMADEX) 20 MG tablet, Take 3 tablets (60 mg total) by mouth daily. (Patient taking differently: Take 60 mg by mouth 2 (two) times daily. ), Disp: 180 tablet, Rfl: 3 .  zolpidem (AMBIEN) 10 MG tablet, Take 10 mg by mouth at bedtime as needed for sleep. For sleep , Disp: , Rfl:  Allergies  Allergen Reactions  . Ceftin Anaphylaxis    Face and throat swell   . Shellfish Allergy Other (See Comments)    Gout exacerbation  . Allopurinol Nausea Only and Other (See Comments)    weakness  . Ativan [Lorazepam] Itching  . Sulfa Antibiotics Itching  . Ultram [Tramadol Hcl] Itching     Social History   Socioeconomic History  . Marital status: Married    Spouse name: Not on file  . Number of children: 2  . Years of education: masters  . Highest education level: Not on file  Occupational History  . Not on file  Social Needs  . Financial resource strain: Not on file  . Food insecurity:    Worry: Not on file    Inability: Not on file  . Transportation needs:    Medical: Not on file    Non-medical: Not on file  Tobacco Use  . Smoking status: Former Smoker    Packs/day: 0.10    Years: 26.00    Pack years: 2.60    Types: Cigarettes    Last attempt to quit: 05/06/2007    Years since quitting: 11.2  . Smokeless tobacco: Never Used  Substance and Sexual Activity  . Alcohol use: No  . Drug use: No  . Sexual activity: Yes  Lifestyle  . Physical activity:    Days per week: Not on file    Minutes per session: Not on file  . Stress: Not on file  Relationships  . Social connections:    Talks on phone: Not on file    Gets together: Not on file    Attends religious service: Not on file    Active member of club or organization: Not on file    Attends meetings of clubs or organizations: Not on file    Relationship status: Not on file  . Intimate partner violence:    Fear of current or ex partner: Not on file    Emotionally abused: Not on file    Physically abused:  Not on file    Forced sexual activity: Not on file  Other Topics Concern  . Not on file  Social History Narrative   Tobacco Use Cigarettes: Former Smoker, Quit in 2008   No Alcohol   No recreational drug use   Diet: Regular/Low Carb   Exercise: None  Occupation: disabled   Education: Research officer, political party, masters   Children: 2   Firearms: No   Therapist, art Use: Always   Former Metallurgist.        Physical Exam Cardiovascular:     Rate and Rhythm: Normal rate.  Pulmonary:     Effort: Pulmonary effort is normal. No respiratory distress.     Breath sounds: No wheezing or rales.  Abdominal:     General: There is no distension.  Musculoskeletal:        General: No swelling.     Right lower leg: No edema.     Left lower leg: No edema.  Skin:    General: Skin is warm and dry.  Psychiatric:        Mood and Affect: Mood normal.         Future Appointments  Date Time Provider [place]  08/31/2018  8:50 AM Narda Amber K, DO [facility] None  09/28/2018 11:20 AM Bensimhon, Shaune Pascal, MD [facility] None  10/05/2018  1:15 PM Marzetta Board, DPM [facility]  10/18/2018  8:45 AM [facility] DEVICE REMOTES [facility]  12/08/2018 10:15 AM Philemon Kingdom, MD [facility] None     There were no vitals taken for this visit.  Weight yesterday-doesn't remember Last visit weight-177  ATF pt CAO x4, pt has no complaints, she stated that she was taking a nap upon my arrival.  She has taken all of her medications besides last night dose. She stated that she went to the hospital to get her head checked out because it was hurting after the fall she had.  Pt denies headache today, no nausea/vomitting and no abnormal unsteady gait.  She also denies sob, chest pain and dizziness. She denies increase in sob while walking; she tries to stay active throughout the day. Pt moods has been a lot better these few weeks during our visits.  rx bottles verified and pill box refilled.     Medication ordered: none  Cassadie Pankonin, EMT Paramedic [phone] 08/12/2018    ACTION: Home visit completed

## 2018-08-15 NOTE — Progress Notes (Signed)
No ICM remote transmission received for 08/08/2018 and next ICM transmission scheduled for 09/05/2018.

## 2018-08-18 ENCOUNTER — Encounter (HOSPITAL_COMMUNITY): Payer: Self-pay

## 2018-08-18 ENCOUNTER — Other Ambulatory Visit (HOSPITAL_COMMUNITY): Payer: Self-pay

## 2018-08-18 NOTE — Progress Notes (Signed)
Paramedicine Encounter    Patient ID: Rhonda [name], female    DOB: 09/21/1950, 68 y.o.   MRN: [id number]    Patient Care Team: Maurice Small, MD as PCP - General (Family Medicine) [name], LCSW as Social Worker (Licensed Clinical Social Worker)  Patient Active Problem List   Diagnosis Date Noted  . Orthostatic hypotension 07/28/2017  . SVD (spontaneous vaginal delivery)   . Peripheral neuropathy   . On home oxygen therapy   . Migraines   . Left bundle branch block   . Hypothyroidism   . Hypertension   . Hyperlipidemia   . Heart murmur   . GERD (gastroesophageal reflux disease)   . Exertional shortness of breath   . Depression   . Complication of anesthesia   . Back pain   . Arthritis   . Anxiety   . Anemia   . HTN (hypertension) 09/14/2013  . Chronic respiratory failure ([place]) 09/14/2013  . 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 Morbid obesity ([place]) 11/01/2012  . Chronic systolic heart failure ([place]) 10/27/2012  . S/P right TKA 05/10/2012  . Endometrial polyp 01/20/2012  . Breast cancer, stage 1 ([place]) 03/26/2011  . Unspecified vitamin D deficiency 03/26/2011    Current Outpatient Medications:  .  acetaZOLAMIDE (DIAMOX) 125 MG tablet, TAKE 1 TABLET(125 MG) BY MOUTH DAILY (Patient taking differently: Take 125 mg by mouth daily. ), Disp: 30 tablet, Rfl: 5 .  buPROPion (WELLBUTRIN XL) 150 MG 24 hr tablet, Take 450 mg by mouth every morning. , Disp: , Rfl:  .  citalopram (CELEXA) 20 MG tablet, Take 20 mg by mouth daily., Disp: , Rfl:  .  clonazePAM (KLONOPIN) 0.5 MG tablet, Take 0.5 mg by mouth 3 (three) times daily as needed for anxiety. , Disp: , Rfl:  .  fexofenadine (ALLEGRA) 180 MG tablet, Take 180 mg by mouth 2 (two) times daily., Disp: , Rfl:  .  gabapentin (NEURONTIN) 300 MG capsule, Take 300 mg by mouth 3 (three) times daily. , Disp: , Rfl:  .  levothyroxine (SYNTHROID, LEVOTHROID) 100 MCG tablet, TAKE 1 TABLET(100 MCG)  BY MOUTH DAILY (Patient taking differently: Take 100 mcg by mouth daily. ), Disp: 45 tablet, Rfl: 3 .  metolazone (ZAROXOLYN) 2.5 MG tablet, take 1 tablet by mouth if needed for FLUID OR EDEMA (Patient taking differently: Take 2.5 mg by mouth daily as needed (fluid retention/edema (weight gain of greater than 6 lbs)). ), Disp: 30 tablet, Rfl: 2 .  midodrine (PROAMATINE) 10 MG tablet, Take 1 tablet (10 mg total) by mouth 3 (three) times daily with meals., Disp: 90 tablet, Rfl: 3 .  montelukast (SINGULAIR) 10 MG tablet, Take 10 mg by mouth at bedtime. , Disp: , Rfl: 1 .  potassium chloride SA (K-DUR,KLOR-CON) 20 MEQ tablet, Take 3 tablets (60 mEq total) by mouth 2 (two) times daily., Disp: 180 tablet, Rfl: 11 .  simvastatin (ZOCOR) 10 MG tablet, Take 10 mg by mouth every evening. , Disp: , Rfl:  .  spironolactone (ALDACTONE) 25 MG tablet, Take 0.5 tablets (12.5 mg total) by mouth every evening., Disp: 45 tablet, Rfl: 1 .  torsemide (DEMADEX) 20 MG tablet, Take 3 tablets (60 mg total) by mouth daily. (Patient taking differently: Take 60 mg by mouth 2 (two) times daily. ), Disp: 180 tablet, Rfl: 3 .  zolpidem (AMBIEN) 10 MG tablet, Take 10 mg by mouth at bedtime as needed for sleep. For sleep , Disp: , Rfl:  .  carboxymethylcellulose (REFRESH PLUS) 0.5 % SOLN, Place 1 drop into both eyes 3 (three) times daily as needed (dry eyes)., Disp: , Rfl:  .  carvedilol (COREG) 3.125 MG tablet, Take 3.125 mg by mouth 2 (two) times daily with a meal., Disp: , Rfl:  .  clindamycin (CLEOCIN) 300 MG capsule, Take 600 mg by mouth See admin instructions. Take 2 capsules (600 mg) by mouth 1 hour prior to dental procedure., Disp: , Rfl:  .  colestipol (COLESTID) 1 g tablet, Take by mouth 2 (two) times daily. , Disp: , Rfl:  .  gabapentin (NEURONTIN) 800 MG tablet, Take 800 mg by mouth at bedtime as needed (neuropathy). , Disp: , Rfl:  .  ipratropium (ATROVENT) 0.03 % nasal spray, Place 2 sprays into both nostrils 2 (two)  times daily., Disp: , Rfl:  .  polyethylene glycol (MIRALAX / GLYCOLAX) packet, Take 17 g by mouth daily as needed for moderate constipation., Disp: , Rfl:  Allergies  Allergen Reactions  . Ceftin Anaphylaxis    Face and throat swell   . Shellfish Allergy Other (See Comments)    Gout exacerbation  . Allopurinol Nausea Only and Other (See Comments)    weakness  . Ativan [Lorazepam] Itching  . Sulfa Antibiotics Itching  . Ultram [Tramadol Hcl] Itching     Social History   Socioeconomic History  . Marital status: Married    Spouse name: Not on file  . Number of children: 2  . Years of education: masters  . Highest education level: Not on file  Occupational History  . Not on file  Social Needs  . Financial resource strain: Not on file  . Food insecurity:    Worry: Not on file    Inability: Not on file  . Transportation needs:    Medical: Not on file    Non-medical: Not on file  Tobacco Use  . Smoking status: Former Smoker    Packs/day: 0.10    Years: 26.00    Pack years: 2.60    Types: Cigarettes    Last attempt to quit: 05/06/2007    Years since quitting: 11.2  . Smokeless tobacco: Never Used  Substance and Sexual Activity  . Alcohol use: No  . Drug use: No  . Sexual activity: Yes  Lifestyle  . Physical activity:    Days per week: Not on file    Minutes per session: Not on file  . Stress: Not on file  Relationships  . Social connections:    Talks on phone: Not on file    Gets together: Not on file    Attends religious service: Not on file    Active member of club or organization: Not on file    Attends meetings of clubs or organizations: Not on file    Relationship status: Not on file  . Intimate partner violence:    Fear of current or ex partner: Not on file    Emotionally abused: Not on file    Physically abused: Not on file    Forced sexual activity: Not on file  Other Topics Concern  . Not on file  Social History Narrative   Tobacco Use Cigarettes:  Former Smoker, Quit in 2008   No Alcohol   No recreational drug use   Diet: Regular/Low Carb   Exercise: None   Occupation: disabled   Education: Research officer, political party, masters   Children: 2   Firearms: No   Therapist, art Use: Always   Former Metallurgist.  Physical Exam Pulmonary:     Effort: Pulmonary effort is normal. No respiratory distress.     Breath sounds: No wheezing or rales.  Musculoskeletal:     Right lower leg: No edema.     Left lower leg: No edema.  Skin:    General: Skin is warm and dry.         Future Appointments  Date Time Provider [place]  09/05/2018 10:25 AM [facility]  09/28/2018 11:20 AM Bensimhon, Shaune Pascal, MD [facility] None  10/05/2018  1:15 PM Marzetta Board, DPM [facility]  10/18/2018  8:45 AM [facility]  12/08/2018 10:15 AM Philemon Kingdom, MD [facility] None     BP 130/80 (BP Location: Right Arm, Patient Position: Sitting)   Pulse 79   Temp 98 F (36.7 C) (Temporal)   Wt 177 lb (80.3 kg)   SpO2 98%   BMI 34.57 kg/m   Weight yesterday-176 The last recorded weight may have been entered incorrectly based on pt's recordings that she showed me today.  Her recorded weight was 177lb  ATF pt CAO x4. She stated that she keep having chills and is worried that she may have a fever.  Pt denies sob, chest pain, dizziness, cough and sore throat.  She has taken most of her medications this week besides two night time meds of the weekend.  Rx bottles verified and pill box refilled.  I checked pt's temperature during this visit temporal temp 97.4 and skin temp (center chest) 98.0.  I advised her of the temp that is considered a high temperature.  I also advised her that if she has any symptoms that she's concerned about to call her pcp or [facility].   Medication ordered: Fexofenadine Zolpidem  Coraline Talwar, EMT  Paramedic [phone] 08/18/2018    ACTION: Home visit completed

## 2018-08-26 ENCOUNTER — Other Ambulatory Visit (HOSPITAL_COMMUNITY): Payer: Self-pay

## 2018-08-26 NOTE — Progress Notes (Signed)
Paramedicine Encounter    Patient ID: Rhonda [name], female    DOB: 23-Dec-1950, 68 y.o.   MRN: [id number]    Patient Care Team: Maurice Small, MD as PCP - General (Family Medicine) [name], LCSW as Social Worker (Licensed Clinical Social Worker)  Patient Active Problem List   Diagnosis Date Noted  . Orthostatic hypotension 07/28/2017  . SVD (spontaneous vaginal delivery)   . Peripheral neuropathy   . On home oxygen therapy   . Migraines   . Left bundle branch block   . Hypothyroidism   . Hypertension   . Hyperlipidemia   . Heart murmur   . GERD (gastroesophageal reflux disease)   . Exertional shortness of breath   . Depression   . Complication of anesthesia   . Back pain   . Arthritis   . Anxiety   . Anemia   . HTN (hypertension) 09/14/2013  . Chronic respiratory failure ([place]) 09/14/2013  . 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 Morbid obesity ([place]) 11/01/2012  . Chronic systolic heart failure ([place]) 10/27/2012  . S/P right TKA 05/10/2012  . Endometrial polyp 01/20/2012  . Breast cancer, stage 1 ([place]) 03/26/2011  . Unspecified vitamin D deficiency 03/26/2011    Current Outpatient Medications:  .  acetaZOLAMIDE (DIAMOX) 125 MG tablet, TAKE 1 TABLET(125 MG) BY MOUTH DAILY (Patient taking differently: Take 125 mg by mouth daily. ), Disp: 30 tablet, Rfl: 5 .  buPROPion (WELLBUTRIN XL) 150 MG 24 hr tablet, Take 450 mg by mouth every morning. , Disp: , Rfl:  .  carvedilol (COREG) 3.125 MG tablet, Take 3.125 mg by mouth 2 (two) times daily with a meal., Disp: , Rfl:  .  citalopram (CELEXA) 20 MG tablet, Take 20 mg by mouth daily., Disp: , Rfl:  .  clonazePAM (KLONOPIN) 0.5 MG tablet, Take 0.5 mg by mouth 3 (three) times daily as needed for anxiety. , Disp: , Rfl:  .  colestipol (COLESTID) 1 g tablet, Take by mouth 2 (two) times daily. , Disp: , Rfl:  .  fexofenadine (ALLEGRA) 180 MG tablet, Take 180 mg by mouth 2 (two) times  daily., Disp: , Rfl:  .  gabapentin (NEURONTIN) 300 MG capsule, Take 300 mg by mouth 3 (three) times daily. , Disp: , Rfl:  .  levothyroxine (SYNTHROID, LEVOTHROID) 100 MCG tablet, TAKE 1 TABLET(100 MCG) BY MOUTH DAILY (Patient taking differently: Take 100 mcg by mouth daily. ), Disp: 45 tablet, Rfl: 3 .  metolazone (ZAROXOLYN) 2.5 MG tablet, take 1 tablet by mouth if needed for FLUID OR EDEMA (Patient taking differently: Take 2.5 mg by mouth daily as needed (fluid retention/edema (weight gain of greater than 6 lbs)). ), Disp: 30 tablet, Rfl: 2 .  midodrine (PROAMATINE) 10 MG tablet, Take 1 tablet (10 mg total) by mouth 3 (three) times daily with meals., Disp: 90 tablet, Rfl: 3 .  montelukast (SINGULAIR) 10 MG tablet, Take 10 mg by mouth at bedtime. , Disp: , Rfl: 1 .  potassium chloride SA (K-DUR,KLOR-CON) 20 MEQ tablet, Take 3 tablets (60 mEq total) by mouth 2 (two) times daily., Disp: 180 tablet, Rfl: 11 .  simvastatin (ZOCOR) 10 MG tablet, Take 10 mg by mouth every evening. , Disp: , Rfl:  .  spironolactone (ALDACTONE) 25 MG tablet, Take 0.5 tablets (12.5 mg total) by mouth every evening., Disp: 45 tablet, Rfl: 1 .  torsemide (DEMADEX) 20 MG tablet, Take 3 tablets (60 mg total) by mouth daily. (Patient taking  differently: Take 60 mg by mouth 2 (two) times daily. ), Disp: 180 tablet, Rfl: 3 .  zolpidem (AMBIEN) 10 MG tablet, Take 10 mg by mouth at bedtime as needed for sleep. For sleep , Disp: , Rfl:  .  carboxymethylcellulose (REFRESH PLUS) 0.5 % SOLN, Place 1 drop into both eyes 3 (three) times daily as needed (dry eyes)., Disp: , Rfl:  .  clindamycin (CLEOCIN) 300 MG capsule, Take 600 mg by mouth See admin instructions. Take 2 capsules (600 mg) by mouth 1 hour prior to dental procedure., Disp: , Rfl:  .  gabapentin (NEURONTIN) 800 MG tablet, Take 800 mg by mouth at bedtime as needed (neuropathy). , Disp: , Rfl:  .  ipratropium (ATROVENT) 0.03 % nasal spray, Place 2 sprays into both nostrils 2  (two) times daily., Disp: , Rfl:  .  polyethylene glycol (MIRALAX / GLYCOLAX) packet, Take 17 g by mouth daily as needed for moderate constipation., Disp: , Rfl:  Allergies  Allergen Reactions  . Ceftin Anaphylaxis    Face and throat swell   . Shellfish Allergy Other (See Comments)    Gout exacerbation  . Allopurinol Nausea Only and Other (See Comments)    weakness  . Ativan [Lorazepam] Itching  . Sulfa Antibiotics Itching  . Ultram [Tramadol Hcl] Itching     Social History   Socioeconomic History  . Marital status: Married    Spouse name: Not on file  . Number of children: 2  . Years of education: masters  . Highest education level: Not on file  Occupational History  . Not on file  Social Needs  . Financial resource strain: Not on file  . Food insecurity:    Worry: Not on file    Inability: Not on file  . Transportation needs:    Medical: Not on file    Non-medical: Not on file  Tobacco Use  . Smoking status: Former Smoker    Packs/day: 0.10    Years: 26.00    Pack years: 2.60    Types: Cigarettes    Last attempt to quit: 05/06/2007    Years since quitting: 11.3  . Smokeless tobacco: Never Used  Substance and Sexual Activity  . Alcohol use: No  . Drug use: No  . Sexual activity: Yes  Lifestyle  . Physical activity:    Days per week: Not on file    Minutes per session: Not on file  . Stress: Not on file  Relationships  . Social connections:    Talks on phone: Not on file    Gets together: Not on file    Attends religious service: Not on file    Active member of club or organization: Not on file    Attends meetings of clubs or organizations: Not on file    Relationship status: Not on file  . Intimate partner violence:    Fear of current or ex partner: Not on file    Emotionally abused: Not on file    Physically abused: Not on file    Forced sexual activity: Not on file  Other Topics Concern  . Not on file  Social History Narrative   Tobacco Use  Cigarettes: Former Smoker, Quit in 2008   No Alcohol   No recreational drug use   Diet: Regular/Low Carb   Exercise: None   Occupation: disabled   Education: Research officer, political party, masters   Children: 2   Firearms: No   Therapist, art Use: Always   Former Engineer, site  Pharmacist, hospital.        Physical Exam Pulmonary:     Effort: Pulmonary effort is normal. No respiratory distress.     Breath sounds: No wheezing or rales.  Musculoskeletal:        General: No swelling.  Skin:    General: Skin is warm and dry.         Future Appointments  Date Time Provider [place]  09/05/2018 10:25 AM [facility]  09/28/2018 11:20 AM Bensimhon, Shaune Pascal, MD [facility] None  10/05/2018  1:15 PM Marzetta Board, DPM [facility]  10/18/2018  8:45 AM [facility]  12/07/2018  7:50 AM Narda Amber K, DO [facility] None  12/08/2018 10:15 AM Philemon Kingdom, MD [facility] None     BP (!) 104/58 (BP Location: Right Arm, Patient Position: Sitting, Cuff Size: Large)   Pulse 77   Resp 15   Wt 172 lb 6.4 oz (78.2 kg)   SpO2 98%   BMI 33.67 kg/m   Weight yesterday-171 Last visit weight-177  ATF pt CAO x4 sitting in the living room watching tv.  She's taken all of her meds this week besides this morning, she hasn't eaten breakfast yet.  Pt denies sob, chest pain and dizziness. She hasn't had any falls this week although she' unsteady on her feet when she first stands up.  She stated that it has gotten worse than before. Pt denies headache, blurred vision and nausea.  rx bottles verified and pill box refilled.    She denies fever, sob, sore throat and body ache.  Pt wears a mask around the house daily.  I wore the proper PPE during this visit.    Medication ordered: none  Malikai Gut, EMT Paramedic [phone] 08/26/2018    ACTION: Home visit completed

## 2018-08-31 ENCOUNTER — Other Ambulatory Visit (HOSPITAL_COMMUNITY): Payer: Self-pay

## 2018-08-31 ENCOUNTER — Ambulatory Visit: Payer: Medicare HMO | Admitting: Neurology

## 2018-08-31 DIAGNOSIS — K0889 Other specified disorders of teeth and supporting structures: Secondary | ICD-10-CM | POA: Insufficient documentation

## 2018-08-31 NOTE — Progress Notes (Signed)
Paramedicine Encounter    Patient ID: Rhonda [name], female    DOB: 02/13/1951, 68 y.o.   MRN: [id number]   Patient Care Team: Maurice Small, MD as PCP - General (Family Medicine) [name], LCSW as Social Worker (Licensed Clinical Social Worker)  Patient Active Problem List   Diagnosis Date Noted  . Orthostatic hypotension 07/28/2017  . SVD (spontaneous vaginal delivery)   . Peripheral neuropathy   . On home oxygen therapy   . Migraines   . Left bundle branch block   . Hypothyroidism   . Hypertension   . Hyperlipidemia   . Heart murmur   . GERD (gastroesophageal reflux disease)   . Exertional shortness of breath   . Depression   . Complication of anesthesia   . Back pain   . Arthritis   . Anxiety   . Anemia   . HTN (hypertension) 09/14/2013  . Chronic respiratory failure ([place]) 09/14/2013  . 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 Morbid obesity ([place]) 11/01/2012  . Chronic systolic heart failure ([place]) 10/27/2012  . S/P right TKA 05/10/2012  . Endometrial polyp 01/20/2012  . Breast cancer, stage 1 ([place]) 03/26/2011  . Unspecified vitamin D deficiency 03/26/2011    Current Outpatient Medications:  .  acetaZOLAMIDE (DIAMOX) 125 MG tablet, TAKE 1 TABLET(125 MG) BY MOUTH DAILY (Patient taking differently: Take 125 mg by mouth daily. ), Disp: 30 tablet, Rfl: 5 .  buPROPion (WELLBUTRIN XL) 150 MG 24 hr tablet, Take 450 mg by mouth every morning. , Disp: , Rfl:  .  carboxymethylcellulose (REFRESH PLUS) 0.5 % SOLN, Place 1 drop into both eyes 3 (three) times daily as needed (dry eyes)., Disp: , Rfl:  .  carvedilol (COREG) 3.125 MG tablet, Take 3.125 mg by mouth 2 (two) times daily with a meal., Disp: , Rfl:  .  citalopram (CELEXA) 20 MG tablet, Take 20 mg by mouth daily., Disp: , Rfl:  .  clonazePAM (KLONOPIN) 0.5 MG tablet, Take 0.5 mg by mouth 3 (three) times daily as needed for anxiety. , Disp: , Rfl:  .  colestipol (COLESTID) 1 g  tablet, Take by mouth 2 (two) times daily. , Disp: , Rfl:  .  fexofenadine (ALLEGRA) 180 MG tablet, Take 180 mg by mouth 2 (two) times daily., Disp: , Rfl:  .  gabapentin (NEURONTIN) 300 MG capsule, Take 300 mg by mouth 3 (three) times daily. , Disp: , Rfl:  .  gabapentin (NEURONTIN) 800 MG tablet, Take 800 mg by mouth at bedtime as needed (neuropathy). , Disp: , Rfl:  .  ipratropium (ATROVENT) 0.03 % nasal spray, Place 2 sprays into both nostrils 2 (two) times daily., Disp: , Rfl:  .  levothyroxine (SYNTHROID, LEVOTHROID) 100 MCG tablet, TAKE 1 TABLET(100 MCG) BY MOUTH DAILY (Patient taking differently: Take 100 mcg by mouth daily. ), Disp: 45 tablet, Rfl: 3 .  midodrine (PROAMATINE) 10 MG tablet, Take 1 tablet (10 mg total) by mouth 3 (three) times daily with meals., Disp: 90 tablet, Rfl: 3 .  montelukast (SINGULAIR) 10 MG tablet, Take 10 mg by mouth at bedtime. , Disp: , Rfl: 1 .  polyethylene glycol (MIRALAX / GLYCOLAX) packet, Take 17 g by mouth daily as needed for moderate constipation., Disp: , Rfl:  .  potassium chloride SA (K-DUR,KLOR-CON) 20 MEQ tablet, Take 3 tablets (60 mEq total) by mouth 2 (two) times daily., Disp: 180 tablet, Rfl: 11 .  simvastatin (ZOCOR) 10 MG tablet, Take 10 mg by  mouth every evening. , Disp: , Rfl:  .  spironolactone (ALDACTONE) 25 MG tablet, Take 0.5 tablets (12.5 mg total) by mouth every evening., Disp: 45 tablet, Rfl: 1 .  torsemide (DEMADEX) 20 MG tablet, Take 3 tablets (60 mg total) by mouth daily. (Patient taking differently: Take 60 mg by mouth 2 (two) times daily. ), Disp: 180 tablet, Rfl: 3 .  zolpidem (AMBIEN) 10 MG tablet, Take 10 mg by mouth at bedtime as needed for sleep. For sleep , Disp: , Rfl:  .  clindamycin (CLEOCIN) 300 MG capsule, Take 600 mg by mouth See admin instructions. Take 2 capsules (600 mg) by mouth 1 hour prior to dental procedure., Disp: , Rfl:  .  metolazone (ZAROXOLYN) 2.5 MG tablet, take 1 tablet by mouth if needed for FLUID OR  EDEMA (Patient taking differently: Take 2.5 mg by mouth daily as needed (fluid retention/edema (weight gain of greater than 6 lbs)). ), Disp: 30 tablet, Rfl: 2 Allergies  Allergen Reactions  . Ceftin Anaphylaxis    Face and throat swell   . Shellfish Allergy Other (See Comments)    Gout exacerbation  . Allopurinol Nausea Only and Other (See Comments)    weakness  . Ativan [Lorazepam] Itching  . Sulfa Antibiotics Itching  . Ultram [Tramadol Hcl] Itching     Social History   Socioeconomic History  . Marital status: Married    Spouse name: Not on file  . Number of children: 2  . Years of education: masters  . Highest education level: Not on file  Occupational History  . Not on file  Social Needs  . Financial resource strain: Not on file  . Food insecurity:    Worry: Not on file    Inability: Not on file  . Transportation needs:    Medical: Not on file    Non-medical: Not on file  Tobacco Use  . Smoking status: Former Smoker    Packs/day: 0.10    Years: 26.00    Pack years: 2.60    Types: Cigarettes    Last attempt to quit: 05/06/2007    Years since quitting: 11.3  . Smokeless tobacco: Never Used  Substance and Sexual Activity  . Alcohol use: No  . Drug use: No  . Sexual activity: Yes  Lifestyle  . Physical activity:    Days per week: Not on file    Minutes per session: Not on file  . Stress: Not on file  Relationships  . Social connections:    Talks on phone: Not on file    Gets together: Not on file    Attends religious service: Not on file    Active member of club or organization: Not on file    Attends meetings of clubs or organizations: Not on file    Relationship status: Not on file  . Intimate partner violence:    Fear of current or ex partner: Not on file    Emotionally abused: Not on file    Physically abused: Not on file    Forced sexual activity: Not on file  Other Topics Concern  . Not on file  Social History Narrative   Tobacco Use  Cigarettes: Former Smoker, Quit in 2008   No Alcohol   No recreational drug use   Diet: Regular/Low Carb   Exercise: None   Occupation: disabled   Education: Research officer, political party, masters   Children: 2   Firearms: No   Therapist, art Use: Always   Former Metallurgist.  Physical Exam Vitals signs reviewed.  HENT:     Head: Normocephalic.     Nose: No congestion or rhinorrhea.     Mouth/Throat:     Comments: Right bottom number 32 tooth in pain, swelling noted with redness and white pus around bottom of tooth nearest the gumline.  Eyes:     Pupils: Pupils are equal, round, and reactive to light.  Neck:     Musculoskeletal: Normal range of motion.  Cardiovascular:     Rate and Rhythm: Normal rate and regular rhythm.  Pulmonary:     Effort: Pulmonary effort is normal. No respiratory distress.     Breath sounds: Normal breath sounds. No stridor.  Abdominal:     General: There is no distension.     Palpations: Abdomen is soft.  Skin:    General: Skin is warm and dry.     Capillary Refill: Capillary refill takes less than 2 seconds.  Neurological:     Mental Status: She is alert. Mental status is at baseline.  Psychiatric:        Mood and Affect: Mood normal.     Arrived to see Mrs. Stepp, she was awake, alert and ambulating around her home. Mrs. Briones immediatly made me aware of a tooth that was bothering her noted to be #32 (bottom right incisor) had some redness, swelling and white pus around the gumline. Patient was advised to contact her dentist. She did so while I reviewed medications and placed pills in her pill box. No medication refills needed at this time. Patient's dentist wished to speak to me on the phone to better assess the patient's symptoms. Dentist was able to set up an appointment for tomorrow at 130 at [facility]. Patient was instructed by dentist to continue taking her Tylenol for pain. Patient's vitals were obtained and are as noted. Mrs. Rembold had no other  complaints. Lung sounds were clear, patient denied any shortness of breath or trouble breathing. Patient denied any trouble sleeping or walking around. Patient denied fever, cough or COVID- 19 symptoms. Patient was instructed to contact her PCP if symptoms came up. Patient was advised I would be back in one week to refill medications. Patient understood and agreed to same. Home visit complete.      Future Appointments  Date Time Provider [place]  09/05/2018 10:25 AM [facility]  09/28/2018 11:20 AM Bensimhon, Shaune Pascal, MD [facility] None  10/05/2018  1:15 PM Marzetta Board, DPM [facility]  10/18/2018  8:45 AM [facility]  12/07/2018  7:50 AM Narda Amber K, DO [facility] None  12/08/2018 10:15 AM Philemon Kingdom, MD [facility] None     ACTION: Home visit completed Next visit planned for one week.

## 2018-09-05 ENCOUNTER — Telehealth: Payer: Self-pay

## 2018-09-05 ENCOUNTER — Other Ambulatory Visit: Payer: Self-pay

## 2018-09-05 ENCOUNTER — Ambulatory Visit (INDEPENDENT_AMBULATORY_CARE_PROVIDER_SITE_OTHER): Payer: Medicare HMO

## 2018-09-05 DIAGNOSIS — Z95810 Presence of automatic (implantable) cardiac defibrillator: Secondary | ICD-10-CM

## 2018-09-05 DIAGNOSIS — I5022 Chronic systolic (congestive) heart failure: Secondary | ICD-10-CM

## 2018-09-05 NOTE — Telephone Encounter (Signed)
Remote ICM transmission received.  Attempted call to patient regarding ICM remote transmission and she reported she is waiting on PCP to make a call to her.  Advised I will attempt call back later.

## 2018-09-05 NOTE — Progress Notes (Signed)
EPIC Encounter for ICM Monitoring  Patient Name: Rhonda [name] is a 68 y.o. female Date: 09/05/2018 Primary Care Physican: Maurice Small, MD Primary Cardiologist:Bensimhon Electrophysiologist: Santina Evans Pacing: 100% LastWeight:178lbs  09/05/2018 Weight:167.2 lbs    Heart Failure questions reviewed, pt asymptomatic.  Two ER visits for falls in March. She thinks maybe she is drinking too much fluid because kidney physician has instructed her to drink a lot of fluid.   Advised to discuss fluid intake with kidney physician and to count how much fluids she is taking in on a daily basis.   Thoracic impedanceabnormalsuggesting fluid accumulation since 06/27/2018 with exception of 6 days at baseline 3/25-3/30.  Prescribed:Torsemide 20 mg3tablets (60 mg total) by mouth twice a day.Potassium 20 meq 3tablets (60 mEq total)twice a day. Metolazone 2.5mg  1 tablet by mouth if needed for fluid or edema.  Labs: 07/26/2018 Creatinine 1.90, BUN 29, Potassium 3.5, Sodium 141, GFR 30-34 07/18/2018 Creatinine 1.43, BUN 21, Potassium 3.7, Sodium 139, GFR 38-44  06/30/2018 Creatinine 1.75, BUN 27, Potassium 3.0, Sodium 139, GFR 30-35  03/30/2018 Creatinine 1.63, BUN 26, Potassium 3.9, Sodium 139, GFR 32-37 02/10/2018 Creatinine 1.85, BUN 41, Potassium 3.9, Sodium 138, GFR 28-33 10/29/2017 Creatinine 1.45, BUN 24, Potassium 3.9, Sodium 141, GFR 37-43 10/21/2017 Creatinine 1.57, BUN 19, Potassium 3.8, Sodium 140, GFR 34-39 09/23/2017 Creatinine 1.26, BUN 29, Potassium 3.2, Sodium 142, GFR 49-56  Recommendations:Advised to limit salt intake to 2000 mg daily and fluid intake to the recommended amount given to her by her physician.    Follow-up plan: ICM clinic phone appointment on4/29/2020 to recheck fluid levels. Office appointment scheduled 09/28/2018 with Dr.Bensimhon.   Copy of ICM check sent to Dr.Taylor and Dr Haroldine Laws.  3 month ICM trend: 09/05/2018    1 Year ICM  trend:       [name], RN 09/05/2018 11:25 AM

## 2018-09-06 DIAGNOSIS — F0632 Mood disorder due to known physiological condition with major depressive-like episode: Secondary | ICD-10-CM | POA: Diagnosis not present

## 2018-09-06 DIAGNOSIS — F3289 Other specified depressive episodes: Secondary | ICD-10-CM | POA: Diagnosis not present

## 2018-09-06 DIAGNOSIS — F064 Anxiety disorder due to known physiological condition: Secondary | ICD-10-CM | POA: Diagnosis not present

## 2018-09-07 ENCOUNTER — Other Ambulatory Visit (HOSPITAL_COMMUNITY): Payer: Self-pay

## 2018-09-07 NOTE — Progress Notes (Signed)
Paramedicine Encounter    Patient ID: Rhonda [name], female    DOB: [birth year], 68 y.o.   MRN: [id number]   Patient Care Team: Maurice Small, MD as PCP - General (Family Medicine) [name], LCSW as Social Worker (Licensed Clinical Social Worker)  Patient Active Problem List   Diagnosis Date Noted  . Orthostatic hypotension 07/28/2017  . SVD (spontaneous vaginal delivery)   . Peripheral neuropathy   . On home oxygen therapy   . Migraines   . Left bundle branch block   . Hypothyroidism   . Hypertension   . Hyperlipidemia   . Heart murmur   . GERD (gastroesophageal reflux disease)   . Exertional shortness of breath   . Depression   . Complication of anesthesia   . Back pain   . Arthritis   . Anxiety   . Anemia   . HTN (hypertension) 09/14/2013  . Chronic respiratory failure ([place]) 09/14/2013  . 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 Morbid obesity ([place]) 11/01/2012  . Chronic systolic heart failure ([place]) 10/27/2012  . S/P right TKA 05/10/2012  . Endometrial polyp 01/20/2012  . Breast cancer, stage 1 ([place]) 03/26/2011  . Unspecified vitamin D deficiency 03/26/2011    Current Outpatient Medications:  .  acetaZOLAMIDE (DIAMOX) 125 MG tablet, TAKE 1 TABLET(125 MG) BY MOUTH DAILY (Patient taking differently: Take 125 mg by mouth daily. ), Disp: 30 tablet, Rfl: 5 .  buPROPion (WELLBUTRIN XL) 150 MG 24 hr tablet, Take 450 mg by mouth every morning. , Disp: , Rfl:  .  carboxymethylcellulose (REFRESH PLUS) 0.5 % SOLN, Place 1 drop into both eyes 3 (three) times daily as needed (dry eyes)., Disp: , Rfl:  .  carvedilol (COREG) 3.125 MG tablet, Take 3.125 mg by mouth 2 (two) times daily with a meal., Disp: , Rfl:  .  citalopram (CELEXA) 20 MG tablet, Take 20 mg by mouth daily., Disp: , Rfl:  .  clonazePAM (KLONOPIN) 0.5 MG tablet, Take 0.5 mg by mouth 3 (three) times daily as needed for anxiety. , Disp: , Rfl:  .  colestipol (COLESTID) 1 g  tablet, Take by mouth 2 (two) times daily. , Disp: , Rfl:  .  fexofenadine (ALLEGRA) 180 MG tablet, Take 180 mg by mouth 2 (two) times daily., Disp: , Rfl:  .  gabapentin (NEURONTIN) 300 MG capsule, Take 300 mg by mouth 3 (three) times daily. , Disp: , Rfl:  .  gabapentin (NEURONTIN) 800 MG tablet, Take 800 mg by mouth at bedtime as needed (neuropathy). , Disp: , Rfl:  .  ipratropium (ATROVENT) 0.03 % nasal spray, Place 2 sprays into both nostrils 2 (two) times daily., Disp: , Rfl:  .  levothyroxine (SYNTHROID, LEVOTHROID) 100 MCG tablet, TAKE 1 TABLET(100 MCG) BY MOUTH DAILY (Patient taking differently: Take 100 mcg by mouth daily. ), Disp: 45 tablet, Rfl: 3 .  midodrine (PROAMATINE) 10 MG tablet, Take 1 tablet (10 mg total) by mouth 3 (three) times daily with meals., Disp: 90 tablet, Rfl: 3 .  montelukast (SINGULAIR) 10 MG tablet, Take 10 mg by mouth at bedtime. , Disp: , Rfl: 1 .  polyethylene glycol (MIRALAX / GLYCOLAX) packet, Take 17 g by mouth daily as needed for moderate constipation., Disp: , Rfl:  .  potassium chloride SA (K-DUR,KLOR-CON) 20 MEQ tablet, Take 3 tablets (60 mEq total) by mouth 2 (two) times daily., Disp: 180 tablet, Rfl: 11 .  simvastatin (ZOCOR) 10 MG tablet, Take 10 mg by  mouth every evening. , Disp: , Rfl:  .  spironolactone (ALDACTONE) 25 MG tablet, Take 0.5 tablets (12.5 mg total) by mouth every evening., Disp: 45 tablet, Rfl: 1 .  torsemide (DEMADEX) 20 MG tablet, Take 3 tablets (60 mg total) by mouth daily. (Patient taking differently: Take 60 mg by mouth 2 (two) times daily. ), Disp: 180 tablet, Rfl: 3 .  zolpidem (AMBIEN) 10 MG tablet, Take 10 mg by mouth at bedtime as needed for sleep. For sleep , Disp: , Rfl:  .  clindamycin (CLEOCIN) 300 MG capsule, Take 600 mg by mouth See admin instructions. Take 2 capsules (600 mg) by mouth 1 hour prior to dental procedure., Disp: , Rfl:  .  metolazone (ZAROXOLYN) 2.5 MG tablet, take 1 tablet by mouth if needed for FLUID OR  EDEMA (Patient not taking: Reported on 09/07/2018), Disp: 30 tablet, Rfl: 2 Allergies  Allergen Reactions  . Ceftin Anaphylaxis    Face and throat swell   . Shellfish Allergy Other (See Comments)    Gout exacerbation  . Allopurinol Nausea Only and Other (See Comments)    weakness  . Ativan [Lorazepam] Itching  . Sulfa Antibiotics Itching  . Ultram [Tramadol Hcl] Itching     Social History   Socioeconomic History  . Marital status: Married    Spouse name: Not on file  . Number of children: 2  . Years of education: masters  . Highest education level: Not on file  Occupational History  . Not on file  Social Needs  . Financial resource strain: Not on file  . Food insecurity:    Worry: Not on file    Inability: Not on file  . Transportation needs:    Medical: Not on file    Non-medical: Not on file  Tobacco Use  . Smoking status: Former Smoker    Packs/day: 0.10    Years: 26.00    Pack years: 2.60    Types: Cigarettes    Last attempt to quit: 05/06/2007    Years since quitting: 11.3  . Smokeless tobacco: Never Used  Substance and Sexual Activity  . Alcohol use: No  . Drug use: No  . Sexual activity: Yes  Lifestyle  . Physical activity:    Days per week: Not on file    Minutes per session: Not on file  . Stress: Not on file  Relationships  . Social connections:    Talks on phone: Not on file    Gets together: Not on file    Attends religious service: Not on file    Active member of club or organization: Not on file    Attends meetings of clubs or organizations: Not on file    Relationship status: Not on file  . Intimate partner violence:    Fear of current or ex partner: Not on file    Emotionally abused: Not on file    Physically abused: Not on file    Forced sexual activity: Not on file  Other Topics Concern  . Not on file  Social History Narrative   Tobacco Use Cigarettes: Former Smoker, Quit in 2008   No Alcohol   No recreational drug use   Diet:  Regular/Low Carb   Exercise: None   Occupation: disabled   Education: Research officer, political party, masters   Children: 2   Firearms: No   Therapist, art Use: Always   Former Metallurgist.        Physical Exam Vitals signs reviewed.  Constitutional:  Appearance: Normal appearance.  HENT:     Nose: No congestion or rhinorrhea.  Eyes:     Pupils: Pupils are equal, round, and reactive to light.  Cardiovascular:     Rate and Rhythm: Normal rate and regular rhythm.     Pulses: Normal pulses.     Heart sounds: Normal heart sounds.  Pulmonary:     Effort: Pulmonary effort is normal. No respiratory distress.     Breath sounds: Normal breath sounds.  Abdominal:     General: There is no distension.     Palpations: Abdomen is soft.  Musculoskeletal:        General: Swelling and tenderness present. No deformity or signs of injury.     Right lower leg: Edema present.     Left lower leg: Edema present.  Skin:    General: Skin is warm and dry.     Capillary Refill: Capillary refill takes less than 2 seconds.  Neurological:     Mental Status: She is alert. Mental status is at baseline.     Motor: Weakness present.     Comments: Weak in right leg.   Psychiatric:        Mood and Affect: Mood normal.     Arrived for home visit with Rhonda [name]. Rhonda [name] was "shuffling" around her kitchen on my arrival appearing to be having difficulty walking. She stated her legs were hurting and primarily her right leg and foot. She stated it hurts worse in the arch of her right foot. I advised patient to roll a frozen water bottle on bottom of foot and take some Tylenol for relief. She understood and agreed. She stated Dr. Justin Mend would be reaching out Friday to do a televisit about same. I advised Rhonda [name] I would make contact with De. Webb's office and advise them of my findings. Patient had no other complaints. Vitals were obtained and are as noted. Patient's medications reviewed and verified. Pill box was filled. Visit  scheduled for one week out. Home visit complete.   Meds Ordered: NONE    Future Appointments  Date Time Provider [place]  09/21/2018  8:05 AM [facility]  09/28/2018 11:20 AM Bensimhon, Shaune Pascal, MD [facility] None  10/05/2018  1:15 PM Marzetta Board, DPM [facility]  10/18/2018  8:45 AM [facility]  12/07/2018  7:50 AM Narda Amber K, DO [facility] None  12/08/2018 10:15 AM Philemon Kingdom, MD [facility] None     ACTION: Home visit completed Next visit planned for one week

## 2018-09-10 ENCOUNTER — Telehealth: Payer: Self-pay | Admitting: Cardiology

## 2018-09-10 NOTE — Telephone Encounter (Signed)
Patient called stating that her legs have been swelling up.  Since Tuesday this had been going on.  She says that her right foot, ankle and leg are swollen and her foot hurts like she has gout.  She has no Left lower extremity edema. Apparently she talked with her PCP who asked the EMT to go out to see her on Wed and take a picture and she never heard back from her PCP.  It is documented in the EMT noted that her pain is mainly in the arch of her right foot and hurst to bear weight on it.  Now the top part of her foot is swollen and painful to touch.  SHe says it is like what she gets when she has gout.  I have instructed her to call her PCP Maurice Small, MD back or whoever is on call for them and do a video visit.  Patient agrees with plan.

## 2018-09-12 ENCOUNTER — Telehealth (HOSPITAL_COMMUNITY): Payer: Self-pay

## 2018-09-12 NOTE — Telephone Encounter (Signed)
Left message for Rhonda [name] to return my call in regards to her foot swelling and for appointment this week.

## 2018-09-14 ENCOUNTER — Other Ambulatory Visit (HOSPITAL_COMMUNITY): Payer: Self-pay

## 2018-09-14 DIAGNOSIS — M79604 Pain in right leg: Secondary | ICD-10-CM | POA: Diagnosis not present

## 2018-09-14 NOTE — Progress Notes (Signed)
Paramedicine Encounter    Patient ID: Rhonda [name], female    DOB: 11-04-1950, 68 y.o.   MRN: [id number]   Patient Care Team: Maurice Small, MD as PCP - General (Family Medicine) [name], LCSW as Social Worker (Licensed Clinical Social Worker)  Patient Active Problem List   Diagnosis Date Noted  . Orthostatic hypotension 07/28/2017  . SVD (spontaneous vaginal delivery)   . Peripheral neuropathy   . On home oxygen therapy   . Migraines   . Left bundle branch block   . Hypothyroidism   . Hypertension   . Hyperlipidemia   . Heart murmur   . GERD (gastroesophageal reflux disease)   . Exertional shortness of breath   . Depression   . Complication of anesthesia   . Back pain   . Arthritis   . Anxiety   . Anemia   . HTN (hypertension) 09/14/2013  . Chronic respiratory failure ([place]) 09/14/2013  . 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 Morbid obesity ([place]) 11/01/2012  . Chronic systolic heart failure ([place]) 10/27/2012  . S/P right TKA 05/10/2012  . Endometrial polyp 01/20/2012  . Breast cancer, stage 1 ([place]) 03/26/2011  . Unspecified vitamin D deficiency 03/26/2011    Current Outpatient Medications:  .  acetaZOLAMIDE (DIAMOX) 125 MG tablet, TAKE 1 TABLET(125 MG) BY MOUTH DAILY (Patient taking differently: Take 125 mg by mouth daily. ), Disp: 30 tablet, Rfl: 5 .  buPROPion (WELLBUTRIN XL) 150 MG 24 hr tablet, Take 450 mg by mouth every morning. , Disp: , Rfl:  .  carboxymethylcellulose (REFRESH PLUS) 0.5 % SOLN, Place 1 drop into both eyes 3 (three) times daily as needed (dry eyes)., Disp: , Rfl:  .  carvedilol (COREG) 3.125 MG tablet, Take 3.125 mg by mouth 2 (two) times daily with a meal., Disp: , Rfl:  .  citalopram (CELEXA) 20 MG tablet, Take 20 mg by mouth daily., Disp: , Rfl:  .  clonazePAM (KLONOPIN) 0.5 MG tablet, Take 0.5 mg by mouth 3 (three) times daily as needed for anxiety. , Disp: , Rfl:  .  colestipol (COLESTID) 1 g  tablet, Take by mouth 2 (two) times daily. , Disp: , Rfl:  .  fexofenadine (ALLEGRA) 180 MG tablet, Take 180 mg by mouth 2 (two) times daily., Disp: , Rfl:  .  gabapentin (NEURONTIN) 300 MG capsule, Take 300 mg by mouth 3 (three) times daily. , Disp: , Rfl:  .  gabapentin (NEURONTIN) 800 MG tablet, Take 800 mg by mouth at bedtime as needed (neuropathy). , Disp: , Rfl:  .  levothyroxine (SYNTHROID, LEVOTHROID) 100 MCG tablet, TAKE 1 TABLET(100 MCG) BY MOUTH DAILY (Patient taking differently: Take 100 mcg by mouth daily. ), Disp: 45 tablet, Rfl: 3 .  midodrine (PROAMATINE) 10 MG tablet, Take 1 tablet (10 mg total) by mouth 3 (three) times daily with meals., Disp: 90 tablet, Rfl: 3 .  montelukast (SINGULAIR) 10 MG tablet, Take 10 mg by mouth at bedtime. , Disp: , Rfl: 1 .  polyethylene glycol (MIRALAX / GLYCOLAX) packet, Take 17 g by mouth daily as needed for moderate constipation., Disp: , Rfl:  .  potassium chloride SA (K-DUR,KLOR-CON) 20 MEQ tablet, Take 3 tablets (60 mEq total) by mouth 2 (two) times daily., Disp: 180 tablet, Rfl: 11 .  simvastatin (ZOCOR) 10 MG tablet, Take 10 mg by mouth every evening. , Disp: , Rfl:  .  spironolactone (ALDACTONE) 25 MG tablet, Take 0.5 tablets (12.5 mg total) by  mouth every evening., Disp: 45 tablet, Rfl: 1 .  torsemide (DEMADEX) 20 MG tablet, Take 3 tablets (60 mg total) by mouth daily. (Patient taking differently: Take 60 mg by mouth 2 (two) times daily. ), Disp: 180 tablet, Rfl: 3 .  zolpidem (AMBIEN) 10 MG tablet, Take 10 mg by mouth at bedtime as needed for sleep. For sleep , Disp: , Rfl:  .  clindamycin (CLEOCIN) 300 MG capsule, Take 600 mg by mouth See admin instructions. Take 2 capsules (600 mg) by mouth 1 hour prior to dental procedure., Disp: , Rfl:  .  ipratropium (ATROVENT) 0.03 % nasal spray, Place 2 sprays into both nostrils 2 (two) times daily., Disp: , Rfl:  .  metolazone (ZAROXOLYN) 2.5 MG tablet, take 1 tablet by mouth if needed for FLUID OR  EDEMA (Patient not taking: Reported on 09/07/2018), Disp: 30 tablet, Rfl: 2 Allergies  Allergen Reactions  . Ceftin Anaphylaxis    Face and throat swell   . Shellfish Allergy Other (See Comments)    Gout exacerbation  . Allopurinol Nausea Only and Other (See Comments)    weakness  . Ativan [Lorazepam] Itching  . Sulfa Antibiotics Itching  . Ultram [Tramadol Hcl] Itching     Social History   Socioeconomic History  . Marital status: Married    Spouse name: Not on file  . Number of children: 2  . Years of education: masters  . Highest education level: Not on file  Occupational History  . Not on file  Social Needs  . Financial resource strain: Not on file  . Food insecurity:    Worry: Not on file    Inability: Not on file  . Transportation needs:    Medical: Not on file    Non-medical: Not on file  Tobacco Use  . Smoking status: Former Smoker    Packs/day: 0.10    Years: 26.00    Pack years: 2.60    Types: Cigarettes    Last attempt to quit: 05/06/2007    Years since quitting: 11.3  . Smokeless tobacco: Never Used  Substance and Sexual Activity  . Alcohol use: No  . Drug use: No  . Sexual activity: Yes  Lifestyle  . Physical activity:    Days per week: Not on file    Minutes per session: Not on file  . Stress: Not on file  Relationships  . Social connections:    Talks on phone: Not on file    Gets together: Not on file    Attends religious service: Not on file    Active member of club or organization: Not on file    Attends meetings of clubs or organizations: Not on file    Relationship status: Not on file  . Intimate partner violence:    Fear of current or ex partner: Not on file    Emotionally abused: Not on file    Physically abused: Not on file    Forced sexual activity: Not on file  Other Topics Concern  . Not on file  Social History Narrative   Tobacco Use Cigarettes: Former Smoker, Quit in 2008   No Alcohol   No recreational drug use   Diet:  Regular/Low Carb   Exercise: None   Occupation: disabled   Education: Research officer, political party, masters   Children: 2   Firearms: No   Therapist, art Use: Always   Former Metallurgist.        Physical Exam Vitals signs reviewed.  HENT:  Head: Normocephalic.     Nose: No congestion or rhinorrhea.  Eyes:     Pupils: Pupils are equal, round, and reactive to light.  Cardiovascular:     Rate and Rhythm: Normal rate and regular rhythm.     Pulses: Normal pulses.  Pulmonary:     Effort: Pulmonary effort is normal.  Abdominal:     General: Abdomen is flat.     Palpations: Abdomen is soft.  Musculoskeletal:        General: Swelling and tenderness present.     Right lower leg: Edema present.     Left lower leg: No edema.  Skin:    General: Skin is warm and dry.     Capillary Refill: Capillary refill takes less than 2 seconds.  Neurological:     Mental Status: She is alert. Mental status is at baseline.  Psychiatric:        Mood and Affect: Mood normal.      Arrived for Mrs. Trembley's home visit. Mrs. Mabey was walking around her home on my arrival, noting to still be having trouble moving her right leg. Mrs. Cange reports her pain in her right leg and foot getting better and the swelling decreasing. Mrs. Riding has 3pm E-visit with Dr. Justin Mend today. Mrs. Zollars had swelling present in the right leg with redness, tenderness but no heat upon touching it. Mrs. Derego's medications were reviewed and verified. Pill box was filled. Noted that she does not have Midodrine for her week of noon or PM dosing. (Will call in medication for her) Vitals obtained and as recorded. Dr. Justin Mend called in via facetime to my phone, Dr. Justin Mend advised she will be bringing her in tomorrow for labs and ultrasound of her leg. Dr. Justin Mend also stated she would begin patient on anti-biotic if she has an infection. Patient agreed to same. Home visit complete, patient agreed to next week home visit.   Meds ordered: Midodrine    Future  Appointments  Date Time Provider [place]  09/21/2018  8:05 AM [facility] DEV[facility] REMOT[facility]  09/28/2018 11:20 AM Bensimhon, Shaune Pascal, MD [facility] None  10/05/2018  1:15 PM Marzetta Board, DPM [facility]  10/18/2018  8:45 AM [facility] DEVICE REMOTES [facility]  12/07/2018  7:50 AM Narda Amber K, DO [facility] None  12/08/2018 10:15 AM Philemon Kingdom, MD [facility] None     ACTION: Home visit completed Next visit planned for one week

## 2018-09-15 DIAGNOSIS — M79604 Pain in right leg: Secondary | ICD-10-CM | POA: Diagnosis not present

## 2018-09-15 DIAGNOSIS — M7989 Other specified soft tissue disorders: Secondary | ICD-10-CM | POA: Diagnosis not present

## 2018-09-20 DIAGNOSIS — F0632 Mood disorder due to known physiological condition with major depressive-like episode: Secondary | ICD-10-CM | POA: Diagnosis not present

## 2018-09-20 DIAGNOSIS — F3289 Other specified depressive episodes: Secondary | ICD-10-CM | POA: Diagnosis not present

## 2018-09-21 ENCOUNTER — Other Ambulatory Visit (HOSPITAL_COMMUNITY): Payer: Self-pay

## 2018-09-21 NOTE — Progress Notes (Signed)
Paramedicine Encounter    Patient ID: Rhonda [name], female    DOB: 11-29-1950, 68 y.o.   MRN: [id number]   Patient Care Team: Maurice Small, MD as PCP - General (Family Medicine) [name], LCSW as Social Worker (Licensed Clinical Social Worker)  Patient Active Problem List   Diagnosis Date Noted  . Orthostatic hypotension 07/28/2017  . SVD (spontaneous vaginal delivery)   . Peripheral neuropathy   . On home oxygen therapy   . Migraines   . Left bundle branch block   . Hypothyroidism   . Hypertension   . Hyperlipidemia   . Heart murmur   . GERD (gastroesophageal reflux disease)   . Exertional shortness of breath   . Depression   . Complication of anesthesia   . Back pain   . Arthritis   . Anxiety   . Anemia   . HTN (hypertension) 09/14/2013  . Chronic respiratory failure ([place]) 09/14/2013  . 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 Morbid obesity ([place]) 11/01/2012  . Chronic systolic heart failure ([place]) 10/27/2012  . S/P right TKA 05/10/2012  . Endometrial polyp 01/20/2012  . Breast cancer, stage 1 ([place]) 03/26/2011  . Unspecified vitamin D deficiency 03/26/2011    Current Outpatient Medications:  .  acetaZOLAMIDE (DIAMOX) 125 MG tablet, TAKE 1 TABLET(125 MG) BY MOUTH DAILY (Patient taking differently: Take 125 mg by mouth daily. ), Disp: 30 tablet, Rfl: 5 .  buPROPion (WELLBUTRIN XL) 150 MG 24 hr tablet, Take 450 mg by mouth every morning. , Disp: , Rfl:  .  carboxymethylcellulose (REFRESH PLUS) 0.5 % SOLN, Place 1 drop into both eyes 3 (three) times daily as needed (dry eyes)., Disp: , Rfl:  .  carvedilol (COREG) 3.125 MG tablet, Take 3.125 mg by mouth 2 (two) times daily with a meal., Disp: , Rfl:  .  citalopram (CELEXA) 20 MG tablet, Take 20 mg by mouth daily., Disp: , Rfl:  .  clonazePAM (KLONOPIN) 0.5 MG tablet, Take 0.5 mg by mouth 3 (three) times daily as needed for anxiety. , Disp: , Rfl:  .  colestipol (COLESTID) 1 g  tablet, Take by mouth 2 (two) times daily. , Disp: , Rfl:  .  fexofenadine (ALLEGRA) 180 MG tablet, Take 180 mg by mouth 2 (two) times daily., Disp: , Rfl:  .  gabapentin (NEURONTIN) 300 MG capsule, Take 300 mg by mouth 3 (three) times daily. , Disp: , Rfl:  .  gabapentin (NEURONTIN) 800 MG tablet, Take 800 mg by mouth at bedtime as needed (neuropathy). , Disp: , Rfl:  .  ipratropium (ATROVENT) 0.03 % nasal spray, Place 2 sprays into both nostrils 2 (two) times daily., Disp: , Rfl:  .  levothyroxine (SYNTHROID, LEVOTHROID) 100 MCG tablet, TAKE 1 TABLET(100 MCG) BY MOUTH DAILY (Patient taking differently: Take 100 mcg by mouth daily. ), Disp: 45 tablet, Rfl: 3 .  midodrine (PROAMATINE) 10 MG tablet, Take 1 tablet (10 mg total) by mouth 3 (three) times daily with meals., Disp: 90 tablet, Rfl: 3 .  montelukast (SINGULAIR) 10 MG tablet, Take 10 mg by mouth at bedtime. , Disp: , Rfl: 1 .  polyethylene glycol (MIRALAX / GLYCOLAX) packet, Take 17 g by mouth daily as needed for moderate constipation., Disp: , Rfl:  .  potassium chloride SA (K-DUR,KLOR-CON) 20 MEQ tablet, Take 3 tablets (60 mEq total) by mouth 2 (two) times daily., Disp: 180 tablet, Rfl: 11 .  simvastatin (ZOCOR) 10 MG tablet, Take 10 mg by  mouth every evening. , Disp: , Rfl:  .  spironolactone (ALDACTONE) 25 MG tablet, Take 0.5 tablets (12.5 mg total) by mouth every evening., Disp: 45 tablet, Rfl: 1 .  torsemide (DEMADEX) 20 MG tablet, Take 3 tablets (60 mg total) by mouth daily. (Patient taking differently: Take 60 mg by mouth 2 (two) times daily. ), Disp: 180 tablet, Rfl: 3 .  zolpidem (AMBIEN) 10 MG tablet, Take 10 mg by mouth at bedtime as needed for sleep. For sleep , Disp: , Rfl:  .  clindamycin (CLEOCIN) 300 MG capsule, Take 600 mg by mouth See admin instructions. Take 2 capsules (600 mg) by mouth 1 hour prior to dental procedure., Disp: , Rfl:  .  metolazone (ZAROXOLYN) 2.5 MG tablet, take 1 tablet by mouth if needed for FLUID OR  EDEMA (Patient not taking: Reported on 09/07/2018), Disp: 30 tablet, Rfl: 2 Allergies  Allergen Reactions  . Ceftin Anaphylaxis    Face and throat swell   . Shellfish Allergy Other (See Comments)    Gout exacerbation  . Allopurinol Nausea Only and Other (See Comments)    weakness  . Ativan [Lorazepam] Itching  . Sulfa Antibiotics Itching  . Ultram [Tramadol Hcl] Itching     Social History   Socioeconomic History  . Marital status: Married    Spouse name: Not on file  . Number of children: 2  . Years of education: masters  . Highest education level: Not on file  Occupational History  . Not on file  Social Needs  . Financial resource strain: Not on file  . Food insecurity:    Worry: Not on file    Inability: Not on file  . Transportation needs:    Medical: Not on file    Non-medical: Not on file  Tobacco Use  . Smoking status: Former Smoker    Packs/day: 0.10    Years: 26.00    Pack years: 2.60    Types: Cigarettes    Last attempt to quit: 05/06/2007    Years since quitting: 11.3  . Smokeless tobacco: Never Used  Substance and Sexual Activity  . Alcohol use: No  . Drug use: No  . Sexual activity: Yes  Lifestyle  . Physical activity:    Days per week: Not on file    Minutes per session: Not on file  . Stress: Not on file  Relationships  . Social connections:    Talks on phone: Not on file    Gets together: Not on file    Attends religious service: Not on file    Active member of club or organization: Not on file    Attends meetings of clubs or organizations: Not on file    Relationship status: Not on file  . Intimate partner violence:    Fear of current or ex partner: Not on file    Emotionally abused: Not on file    Physically abused: Not on file    Forced sexual activity: Not on file  Other Topics Concern  . Not on file  Social History Narrative   Tobacco Use Cigarettes: Former Smoker, Quit in 2008   No Alcohol   No recreational drug use   Diet:  Regular/Low Carb   Exercise: None   Occupation: disabled   Education: Research officer, political party, masters   Children: 2   Firearms: No   Therapist, art Use: Always   Former Metallurgist.        Physical Exam Vitals signs reviewed.  Constitutional:  Appearance: She is normal weight.  HENT:     Head: Normocephalic.     Nose: Rhinorrhea present. No congestion.     Mouth/Throat:     Mouth: Mucous membranes are moist.  Neck:     Musculoskeletal: Normal range of motion.  Cardiovascular:     Rate and Rhythm: Normal rate and regular rhythm.     Pulses: Normal pulses.     Heart sounds: Normal heart sounds.  Pulmonary:     Effort: Pulmonary effort is normal.     Breath sounds: Normal breath sounds.  Musculoskeletal: Normal range of motion.     Right lower leg: Edema present.     Left lower leg: Edema present.  Skin:    General: Skin is warm and dry.     Capillary Refill: Capillary refill takes less than 2 seconds.  Neurological:     Mental Status: She is alert. Mental status is at baseline.     Motor: No weakness.     Gait: Gait normal.  Psychiatric:        Mood and Affect: Mood normal.     Visited Mrs. Sledge at home today who reports feeling generally well overall. She just mentioned feeling "stressed". Mrs. Massi denied shortness of breath, increased dizziness or pain. She did report having "small bump" on her tongue and inside her mouth. Upon assessment I did not observe anything abnormal. Mrs. Winnick had some edema noted to both legs but has improved since last visit. Lung sounds were clear. Mrs. Malanga's medications were reviewed and verified.  Pill box was filled. Medications for refill were noted. Patient was instructed to call her primary doctor if the issue with her mouth persisted. He agreed. Mrs. Mucha agreed to home visit in one week. Visit complete.   Meds Ordered:  [place]    Future Appointments  Date Time Provider [place]  09/27/2018  8:10 AM [facility] DEVICE REMOT[facility]  [facility]  09/28/2018 11:20 AM Bensimhon, Shaune Pascal, MD [facility] None  10/05/2018  1:15 PM Marzetta Board, DPM [facility]  10/06/2018  9:00 AM [facility] [place] 1 [id number] [address] [facility]  10/18/2018  8:45 AM [facility] DEVICE REMOTES [facility]  12/07/2018  7:50 AM Narda Amber K, DO [facility] None  12/08/2018 10:15 AM Philemon Kingdom, MD [facility] None     ACTION: Home visit completed Next visit planned for one week

## 2018-09-26 ENCOUNTER — Telehealth (HOSPITAL_COMMUNITY): Payer: Self-pay

## 2018-09-26 NOTE — Telephone Encounter (Signed)
Spoke to Rhonda [name] who states she has had a rough weekend. She complains of heart burn all weekend, she states she tried to take her medications but the medications would come right back up. She states normally she can drink cold water and it will relieve it but it has not done so. She states she has had a lot of gas and took Gas X. The Gas X did not help. I encouraged her to take Anti-acids. She said she was eating her regular diet. She denied fried foods. She stated she would be contacting her primary care doctor as well. I confirmed appointment time for 2pm on Weds.

## 2018-09-27 ENCOUNTER — Telehealth: Payer: Self-pay | Admitting: Internal Medicine

## 2018-09-27 ENCOUNTER — Other Ambulatory Visit: Payer: Self-pay

## 2018-09-27 ENCOUNTER — Ambulatory Visit (INDEPENDENT_AMBULATORY_CARE_PROVIDER_SITE_OTHER): Payer: Medicare HMO

## 2018-09-27 DIAGNOSIS — Z95810 Presence of automatic (implantable) cardiac defibrillator: Secondary | ICD-10-CM

## 2018-09-27 DIAGNOSIS — I5022 Chronic systolic (congestive) heart failure: Secondary | ICD-10-CM

## 2018-09-27 NOTE — Telephone Encounter (Signed)
Patients states she has an appointment with the heart doctor tomorrow and they always state she drinks to much water. States she drinks 64oz of water due to her kidneys and states Dr.Gherghe would like her to drink plenty of water.   Please Advise, Thanks

## 2018-09-27 NOTE — Progress Notes (Signed)
EPIC Encounter for ICM Monitoring  Patient Name: Rhonda [name] is a 68 y.o. female Date: 09/27/2018 Primary Care Physican: Maurice Small, MD Primary Cardiologist:Bensimhon Electrophysiologist: Santina Evans Pacing: 100% LastWeight:178lbs  09/05/2018 Weight:167.2 lbs   Transmission reviewed.   Thoracic impedanceabnormalsuggesting fluid accumulation since 06/27/2018 with exception of 6 days at baseline 3/25-3/30.  Prescribed:Torsemide 20 mg3tablets (60 mg total)by mouth twice a day.Potassium 20 meq 3tablets (60 mEq total)twice a day. Metolazone 2.5mg  1 tablet by mouth if needed for fluid or edema.  Labs: 07/26/2018 Creatinine1.90Nicky Pugh, Potassium3.5, Sodium141, FRT02-11 07/18/2018 Creatinine1.43, BUN21, Potassium3.7, [id number], DCV01-31  06/30/2018 Creatinine1.75, BUN27, Potassium3.0, [id number], [id number] 03/30/2018 Creatinine 1.63, BUN 26, Potassium 3.9, Sodium 139, GFR 32-37 02/10/2018 Creatinine 1.85, BUN 41, Potassium 3.9, Sodium 138, GFR 28-33 10/29/2017 Creatinine 1.45, BUN 24, Potassium 3.9, Sodium 141, GFR 37-43 10/21/2017 Creatinine 1.57, BUN 19, Potassium 3.8, Sodium 140, GFR 34-39 09/23/2017 Creatinine 1.26, BUN 29, Potassium 3.2, Sodium 142, GFR 49-56  Recommendations:Patient has appointment with Dr Marjie Skiff and any changes will be given at that time.   Follow-up plan: ICM clinic phone appointment on5/20/2020 for monthly follow up and to recheck fluid levels. Office appointment scheduled5/10/2018 with Dr.Bensimhon.   Copy of ICM check sent to Dr.Taylor and Dr Haroldine Laws.   3 month ICM trend: 09/27/2018    1 Year ICM trend:       [name], RN 09/27/2018 3:23 PM

## 2018-09-28 ENCOUNTER — Other Ambulatory Visit: Payer: Self-pay

## 2018-09-28 ENCOUNTER — Ambulatory Visit (HOSPITAL_COMMUNITY)
Admission: RE | Admit: 2018-09-28 | Discharge: 2018-09-28 | Disposition: A | Discharge status: Home / Self Care | Payer: Medicare HMO | Source: Ambulatory Visit | Attending: Internal Medicine | Admitting: Internal Medicine

## 2018-09-28 ENCOUNTER — Encounter (HOSPITAL_COMMUNITY): Payer: Self-pay

## 2018-09-28 ENCOUNTER — Telehealth (HOSPITAL_COMMUNITY): Payer: Self-pay

## 2018-09-28 ENCOUNTER — Other Ambulatory Visit (HOSPITAL_COMMUNITY): Payer: Self-pay

## 2018-09-28 VITALS — BP 118/58 | HR 83 | Temp 99.2°F | Resp 16 | Wt 166.4 lb

## 2018-09-28 DIAGNOSIS — K2950 Unspecified chronic gastritis without bleeding: Secondary | ICD-10-CM | POA: Diagnosis not present

## 2018-09-28 DIAGNOSIS — I5022 Chronic systolic (congestive) heart failure: Secondary | ICD-10-CM | POA: Diagnosis not present

## 2018-09-28 DIAGNOSIS — I428 Other cardiomyopathies: Secondary | ICD-10-CM

## 2018-09-28 DIAGNOSIS — R110 Nausea: Secondary | ICD-10-CM

## 2018-09-28 DIAGNOSIS — I951 Orthostatic hypotension: Secondary | ICD-10-CM

## 2018-09-28 NOTE — Telephone Encounter (Signed)
AVS Mailed: Please restart meds once nausea goes away.   Elevate legs and use compression hose.  Recommended follow-up: 6 months. Dr Haroldine Laws Aletha Halim put on recall list

## 2018-09-28 NOTE — Telephone Encounter (Signed)
I do not recall advising her that...  I am seeing her for her hypothyroidism.   However, usually when I advise patients to drink plenty of water, I do not give them a specific amount, but advised them to drink to thirst.  She needs to follow the advice of her cardiologist regarding the absolute amount that she drinks.

## 2018-09-28 NOTE — Progress Notes (Signed)
Paramedicine Encounter    Patient ID: Rhonda [name], female    DOB: 12-25-1950, 68 y.o.   MRN: [id number]   Patient Care Team: Maurice Small, MD as PCP - General (Family Medicine) [name], LCSW as Social Worker (Licensed Clinical Social Worker)  Patient Active Problem List   Diagnosis Date Noted  . Orthostatic hypotension 07/28/2017  . SVD (spontaneous vaginal delivery)   . Peripheral neuropathy   . On home oxygen therapy   . Migraines   . Left bundle branch block   . Hypothyroidism   . Hypertension   . Hyperlipidemia   . Heart murmur   . GERD (gastroesophageal reflux disease)   . Exertional shortness of breath   . Depression   . Complication of anesthesia   . Back pain   . Arthritis   . Anxiety   . Anemia   . HTN (hypertension) 09/14/2013  . Chronic respiratory failure ([place]) 09/14/2013  . 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 Morbid obesity ([place]) 11/01/2012  . Chronic systolic heart failure ([place]) 10/27/2012  . S/P right TKA 05/10/2012  . Endometrial polyp 01/20/2012  . Breast cancer, stage 1 ([place]) 03/26/2011  . Unspecified vitamin D deficiency 03/26/2011    Current Outpatient Medications:  .  acetaZOLAMIDE (DIAMOX) 125 MG tablet, TAKE 1 TABLET(125 MG) BY MOUTH DAILY (Patient not taking: No sig reported), Disp: 30 tablet, Rfl: 5 .  buPROPion (WELLBUTRIN XL) 150 MG 24 hr tablet, Take 450 mg by mouth every morning. , Disp: , Rfl:  .  carboxymethylcellulose (REFRESH PLUS) 0.5 % SOLN, Place 1 drop into both eyes 3 (three) times daily as needed (dry eyes)., Disp: , Rfl:  .  carvedilol (COREG) 3.125 MG tablet, Take 3.125 mg by mouth 2 (two) times daily with a meal., Disp: , Rfl:  .  citalopram (CELEXA) 20 MG tablet, Take 20 mg by mouth daily., Disp: , Rfl:  .  clindamycin (CLEOCIN) 300 MG capsule, Take 600 mg by mouth See admin instructions. Take 2 capsules (600 mg) by mouth 1 hour prior to dental procedure., Disp: , Rfl:  .   clonazePAM (KLONOPIN) 0.5 MG tablet, Take 0.5 mg by mouth 3 (three) times daily as needed for anxiety. , Disp: , Rfl:  .  colestipol (COLESTID) 1 g tablet, Take by mouth 2 (two) times daily. , Disp: , Rfl:  .  fexofenadine (ALLEGRA) 180 MG tablet, Take 180 mg by mouth 2 (two) times daily., Disp: , Rfl:  .  gabapentin (NEURONTIN) 300 MG capsule, Take 300 mg by mouth 3 (three) times daily. , Disp: , Rfl:  .  gabapentin (NEURONTIN) 800 MG tablet, Take 800 mg by mouth at bedtime as needed (neuropathy). , Disp: , Rfl:  .  ipratropium (ATROVENT) 0.03 % nasal spray, Place 2 sprays into both nostrils 2 (two) times daily., Disp: , Rfl:  .  levothyroxine (SYNTHROID, LEVOTHROID) 100 MCG tablet, TAKE 1 TABLET(100 MCG) BY MOUTH DAILY (Patient taking differently: Take 100 mcg by mouth daily. ), Disp: 45 tablet, Rfl: 3 .  metolazone (ZAROXOLYN) 2.5 MG tablet, take 1 tablet by mouth if needed for FLUID OR EDEMA (Patient not taking: Reported on 09/28/2018), Disp: 30 tablet, Rfl: 2 .  midodrine (PROAMATINE) 10 MG tablet, Take 1 tablet (10 mg total) by mouth 3 (three) times daily with meals. (Patient not taking: Reported on 09/28/2018), Disp: 90 tablet, Rfl: 3 .  montelukast (SINGULAIR) 10 MG tablet, Take 10 mg by mouth at bedtime. , Disp: ,  Rfl: 1 .  polyethylene glycol (MIRALAX / GLYCOLAX) packet, Take 17 g by mouth daily as needed for moderate constipation., Disp: , Rfl:  .  potassium chloride SA (K-DUR,KLOR-CON) 20 MEQ tablet, Take 3 tablets (60 mEq total) by mouth 2 (two) times daily., Disp: 180 tablet, Rfl: 11 .  simvastatin (ZOCOR) 10 MG tablet, Take 10 mg by mouth every evening. , Disp: , Rfl:  .  spironolactone (ALDACTONE) 25 MG tablet, Take 0.5 tablets (12.5 mg total) by mouth every evening. (Patient not taking: Reported on 09/28/2018), Disp: 45 tablet, Rfl: 1 .  torsemide (DEMADEX) 20 MG tablet, Take 3 tablets (60 mg total) by mouth daily. (Patient not taking: Reported on 09/28/2018), Disp: 180 tablet, Rfl: 3 .   zolpidem (AMBIEN) 10 MG tablet, Take 10 mg by mouth at bedtime as needed for sleep. For sleep , Disp: , Rfl:  Allergies  Allergen Reactions  . Ceftin Anaphylaxis    Face and throat swell   . Shellfish Allergy Other (See Comments)    Gout exacerbation  . Allopurinol Nausea Only and Other (See Comments)    weakness  . Ativan [Lorazepam] Itching  . Sulfa Antibiotics Itching  . Ultram [Tramadol Hcl] Itching     Social History   Socioeconomic History  . Marital status: Married    Spouse name: Not on file  . Number of children: 2  . Years of education: masters  . Highest education level: Not on file  Occupational History  . Not on file  Social Needs  . Financial resource strain: Not on file  . Food insecurity:    Worry: Not on file    Inability: Not on file  . Transportation needs:    Medical: Not on file    Non-medical: Not on file  Tobacco Use  . Smoking status: Former Smoker    Packs/day: 0.10    Years: 26.00    Pack years: 2.60    Types: Cigarettes    Last attempt to quit: 05/06/2007    Years since quitting: 11.4  . Smokeless tobacco: Never Used  Substance and Sexual Activity  . Alcohol use: No  . Drug use: No  . Sexual activity: Yes  Lifestyle  . Physical activity:    Days per week: Not on file    Minutes per session: Not on file  . Stress: Not on file  Relationships  . Social connections:    Talks on phone: Not on file    Gets together: Not on file    Attends religious service: Not on file    Active member of club or organization: Not on file    Attends meetings of clubs or organizations: Not on file    Relationship status: Not on file  . Intimate partner violence:    Fear of current or ex partner: Not on file    Emotionally abused: Not on file    Physically abused: Not on file    Forced sexual activity: Not on file  Other Topics Concern  . Not on file  Social History Narrative   Tobacco Use Cigarettes: Former Smoker, Quit in 2008   No Alcohol   No  recreational drug use   Diet: Regular/Low Carb   Exercise: None   Occupation: disabled   Education: Research officer, political party, masters   Children: 2   Firearms: No   Therapist, art Use: Always   Former Metallurgist.        Physical Exam Vitals signs reviewed.  HENT:  Head: Normocephalic.     Nose: Nose normal.     Mouth/Throat:     Mouth: Mucous membranes are moist.  Eyes:     Pupils: Pupils are equal, round, and reactive to light.  Neck:     Musculoskeletal: Normal range of motion.  Cardiovascular:     Rate and Rhythm: Normal rate.     Pulses: Normal pulses.  Pulmonary:     Effort: Pulmonary effort is normal.  Musculoskeletal: Normal range of motion.        General: Swelling present.     Right lower leg: Edema present.     Left lower leg: Edema present.  Skin:    General: Skin is warm and dry.     Capillary Refill: Capillary refill takes less than 2 seconds.  Neurological:     General: No focal deficit present.     Mental Status: She is alert.  Psychiatric:        Mood and Affect: Mood normal.    Arrived for home visit for Rhonda [name] who was ambulating within the house. Rhonda [name] report not feeling good complaining of abdominal pain, nausea and episodes of vomiting. Rhonda [name] states that for 5 days she has not been able to take her medications due to nausea, vomiting with heart burn and abdominal pain. Rhonda [name] noted to have edema in both legs. No redness, tenderness in legs noted. [name] facetimed me for E-VISIT with Rhonda [name]. Amy recommended follow up with PCP. PCP E-VISIT took place after in which Dr. Justin Mend believes the Doxycycline she was given for possible cellulitis has caused Gastritis. Dr. Justin Mend states she will send in RX for acid medication for stomach and anti-nausea medication to pharmacy. Dr. Justin Mend states she is going to working on decreasing Rhonda [name]'s medication's as best she can to decrease the amount of medication she takes aside from her Heart Failure medications.  E-Visits were completed. Vitals were obtained and are as recorded. Medications were reviewed and verified and pill box was filled accordingly. Rhonda [name] was instructed to take medications as prescribed that were placed in pill box by me and to not alter pills in anyway. Patient understood. Patient agreed to home visit in one week.    Medications ordered:  -Allegra -Zolpidem       Future Appointments  Date Time Provider [place]  10/05/2018  1:15 PM Marzetta Board, DPM [facility]  10/06/2018  9:00 AM [facility] [place] [facility]  10/12/2018  9:30 AM [facility] DEVICE REMOTES [facility]  10/18/2018  8:45 AM [facility] DEVICE REMOTES [facility]  12/07/2018  7:50 AM Narda Amber K, DO [facility] None  12/08/2018 10:15 AM Philemon Kingdom, MD [facility] None     ACTION: Home visit completed Next visit planned for one week

## 2018-09-28 NOTE — Patient Instructions (Signed)
Please restart meds once nausea goes away.   Elevate legs and use compression hose.   Your physician wants you to follow-up in: 6 Months with Dr Haroldine Laws. You will receive a reminder letter in the mail two months in advance. If you don't receive a letter, please call our office to schedule the follow-up appointment.  Please call the office at [phone], option 2 for the nurses, with any questions or concerns.

## 2018-09-28 NOTE — Progress Notes (Signed)
Heart Failure TeleHealth Note  Due to national recommendations of social distancing due to [place] 19, Audio/video telehealth visit is felt to be most appropriate for this patient at this time.  See MyChart message from today for patient consent regarding telehealth for [name].  Date:  09/28/2018   ID:  [name], DOB 16-Nov-1950, MRN [id number]  Location: Home  Provider location:  Advanced Heart Failure Type of Visit: Established patient   PCP:  Maurice Small, MD  Cardiologist:  No primary care provider on file. Primary HF: Dr Haroldine Laws  Chief Complaint: Heart Failure   History of Present Illness: Rhonda [name] is a 68 y.o. 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 She has had sleep study with no  evidence of sleep apnea.  She has a Medtronic CRT-D device.  Last echo in 7/18 showed recovery of EF to 55-60%.   She has been following with Dr [name] In neurology and has been started on midodrine for orthostasis. Also started her on diamox for elevated ICP. W/u for TTR amyloid has been negative.  Followed by Paramedicine.   Most recent ECHO 06/2018 EF 55-60%.  She  presents via Administrator for a telehealth visit today.  Overall feeling fair. On 09/14/18 she was placed on 10 days of antibiotics for cellulitis. Over the last 3-4 days she has had nausea and gagging after she takes her medications. Having a sore throat from nasuea. Denies PND/Orthopnea. Having increased leg edema.  Appetite poor due to nausea. No fever or chills. Weight at home 166-168 pounds. She has not been taking medications for last 3 days due to nausea. Follows with HF  Paramedicine.   she denies symptoms worrisome for COVID 19.   Past Medical History:  Diagnosis Date  . Anemia   . Anxiety   . Arthritis    RIGHT KNEE ARTHRITIS AND PAIN-PT TOLD BONE ON BONE  . Asthma   . Back pain    DISK PROBLEM  . Breast cancer, stage 1 (Rhonda [name]) 03/26/2011   left-FINISHED CHEMO  AND RADIATION  . Cardiomyopathy    [name]  . Chronic systolic heart failure (H[facility])    a) NICM b) ECHO (03/2013) EF 20-25% c) ECHO (09/2013) EF 45-50%, grade I DD  . Complication of anesthesia    PT STATES HER B/P LOW AFTER ONE OF HER SURGERIES--SHE ATTRIBUTES TO LYING FLAT  . Depression   . Diabetes mellitus    "diet controlled" (05/04/2103)  . Exertional shortness of breath   . GERD (gastroesophageal reflux disease)   . Gout   . Heart murmur   . Hyperlipidemia   . Hypertension   . Hypothyroidism   . Left bundle branch block    s/p CRT-D (04/2013)  . Migraines   . On home oxygen therapy    "2L suppose to be q night" (05/03/2013)  . Peripheral neuropathy    feet  . SVD (spontaneous vaginal delivery)    x 2  . Unspecified vitamin D deficiency 03/26/2011   does not take meds   Past Surgical History:  Procedure Laterality Date  . BI-VENTRICULAR IMPLANTABLE CARDIOVERTER DEFIBRILLATOR N/A 05/03/2013   Procedure:  BI-VENTRICULAR IMPLANTABLE CARDIOVERTER DEFIBRILLATOR  (CRT-D);  Surgeon: Evans Lance, MD;  Location[facility] CATH LAB;  Service: Cardiovascular;  Laterality: N/A;  . BI-VENTRICULAR IMPLANTABLE CARDIOVERTER DEFIBRILLATOR  (CRT-D)  05/03/2013   MDT CRTD implanted by Dr Lovena Le for non ischemic cardiomyopathy  . BIOPSY  05/31/2018   Procedure: BIOPSY;  Surgeon: Ronnette Juniper, MD;  Location: [facility]OS[facility];  Service: Gastroenterology;;  . BREAST LUMPECTOMY Left 07/28/2010  . CARDIAC CATHETERIZATION  2010   NORMAL CORONARY ARTERIES  . 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--11 SURGERIES     PT HAS REMOVABLE SPEECH BULB-TAKES IT OUT BEFORE HER SURGERY  . COLONOSCOPY     . COLONOSCOPY N/A 05/31/2018   Procedure: COLONOSCOPY;  Surgeon: Ronnette Juniper, MD;  Location: [facility];  Service: Gastroenterology;  Laterality: N/A;  . HYSTEROSCOPY W/D&C  01/20/2012   Procedure: DILATATION AND CURETTAGE /HYSTEROSCOPY;  Surgeon: Margarette Asal, MD;  Location: [facility];  Service: Gynecology;  Laterality: N/A;  with trueclear  . PORT-A-CATH REMOVAL  09/23/2011   Procedure: REMOVAL PORT-A-CATH;  Surgeon: Rolm Bookbinder, MD;  Location: [facility];  Service: General;  Laterality: N/A;  Port Removal  . PORTACATH PLACEMENT  2012  . TONSILLECTOMY  1960's  . TOTAL KNEE ARTHROPLASTY  05/10/2012   Procedure: TOTAL KNEE ARTHROPLASTY;  Surgeon: Mauri Pole, MD;  Location: [facility];  Service: Orthopedics;  Laterality: Right;  . WISDOM TOOTH EXTRACTION       Current Outpatient Medications  Medication Sig Dispense Refill  . acetaZOLAMIDE (DIAMOX) 125 MG tablet TAKE 1 TABLET(125 MG) BY MOUTH DAILY (Patient taking differently: Take 125 mg by mouth daily. ) 30 tablet 5  . buPROPion (WELLBUTRIN XL) 150 MG 24 hr tablet Take 450 mg by mouth every morning.     . carboxymethylcellulose (REFRESH PLUS) 0.5 % SOLN Place 1 drop into both eyes 3 (three) times daily as needed (dry eyes).    . carvedilol (COREG) 3.125 MG tablet Take 3.125 mg by mouth 2 (two) times daily with a meal.    . citalopram (CELEXA) 20 MG tablet Take 20 mg by mouth daily.    . clindamycin (CLEOCIN) 300 MG capsule Take 600 mg by mouth See admin instructions. Take 2 capsules (600 mg) by mouth 1 hour prior to dental procedure.    . clonazePAM (KLONOPIN) 0.5 MG tablet Take 0.5 mg by mouth 3 (three) times daily as needed for anxiety.     . colestipol (COLESTID) 1 g tablet Take by mouth 2 (two) times daily.     . fexofenadine (ALLEGRA) 180 MG tablet Take 180 mg by mouth 2 (two) times daily.    Marland Kitchen gabapentin (NEURONTIN) 300 MG capsule Take 300 mg by mouth 3 (three) times daily.     Marland Kitchen gabapentin (NEURONTIN) 800 MG tablet Take 800 mg  by mouth at bedtime as needed (neuropathy).     Marland Kitchen ipratropium (ATROVENT) 0.03 % nasal spray Place 2 sprays into both nostrils 2 (two) times daily.    Marland Kitchen levothyroxine (SYNTHROID, LEVOTHROID) 100 MCG tablet TAKE 1 TABLET(100 MCG) BY MOUTH DAILY (Patient taking differently: Take 100 mcg by mouth daily. ) 45 tablet 3  . metolazone (ZAROXOLYN) 2.5 MG tablet take 1 tablet by mouth if needed for FLUID OR EDEMA 30 tablet 2  . midodrine (PROAMATINE) 10 MG tablet Take 1 tablet (10 mg total) by mouth 3 (three) times daily with meals. 90 tablet 3  . montelukast (SINGULAIR) 10 MG tablet Take 10 mg by mouth at bedtime.  1  . polyethylene glycol (MIRALAX / GLYCOLAX) packet Take 17 g by mouth daily as needed for moderate constipation.    . potassium chloride SA (K-DUR,KLOR-CON) 20 MEQ tablet Take 3 tablets (60 mEq total) by mouth 2 (two) times daily. 180 tablet 11  . simvastatin (ZOCOR) 10 MG tablet Take 10 mg by mouth every evening.     Marland Kitchen spironolactone (ALDACTONE) 25 MG tablet Take 0.5 tablets (12.5 mg total) by mouth every evening. 45 tablet 1  . torsemide (DEMADEX) 20 MG tablet Take 3 tablets (60 mg total) by mouth daily. (Patient taking differently: Take 60 mg by mouth 2 (two) times daily. ) 180 tablet 3  . zolpidem (AMBIEN) 10 MG tablet Take 10 mg by mouth at bedtime as needed for sleep. For sleep      No current facility-administered medications for this encounter.     Allergies:   Ceftin; Shellfish allergy; Allopurinol; Ativan [lorazepam]; Sulfa antibiotics; and Ultram [tramadol hcl]   Social History:  The patient  reports that she quit smoking about 11 years ago. Her smoking use included cigarettes. She has a 2.60 pack-year smoking history. She has never used smokeless tobacco. She reports that she does not drink alcohol or use drugs.   Family History:  The patient's family history includes Alzheimer's disease in her mother; Birth defects in her paternal uncle; CVA in her mother; Cancer - Colon in her  father; Hypertension in her mother.   ROS:  Please see the history of present illness.   All other systems are personally reviewed and negative.   Exam:  Video Health Call; Exam is visual.  General:  Speaks in full sentences. No resp difficulty. Lungs: Normal respiratory effort with conversation.  Abdomen: Non-distended per patient report Extremities: LLE 1-2 edema. RLE trace edema.  Neuro: Alert & oriented x 3.   Recent Labs: 02/10/2018: TSH 1.20 07/26/2018: ALT 24; BUN 29; Creatinine, Ser 1.90; Hemoglobin 11.2; Platelets 236; Potassium 3.5; Sodium 141  Personally reviewed   Wt Readings from Last 3 Encounters:  09/28/18 75.5 kg (166 lb 6.4 oz)  09/21/18 77.6 kg (171 lb 1.6 oz)  09/14/18 78 kg (172 lb)      ASSESSMENT AND PLAN:  1) Chronic systolic HF: NICM s/p Medtronic CRT-D, ?Viral myocarditis. Cardiomyopathy dates from 24. EF 45-50% (09/2013), EF up to 55-60% on 7/18 echo.  - Echo today (06/30/18) EF 55-60% Personally reviewed - PYP 09/02/17 negative for TTR (Grade 0-1, H/CCL 1.2). SPEP with no M-spike.  -NYHA II. Volume status mildly elevated.  - Restart diuretics once she can take diuretics. Continue torsemide to 60 mg BID  - Continue coreg 3.125 mg BID.  - Continue spiro 12.5 mg daily.  - 2) Obesity Body mass index is 32.5 kg/m. 3) HTN -Stable.  4) Orthostatic Hypotension.  - Neurology following. Gabapentin decreased. - Continue midodrine 10 mg TID.  - Continue to follow with Dr. [name] in Neuro.  5) Chronic Venous stasis - Continue compression hose 6) Nausea  I wonder if the nausea is related to antibiotics.  She has follow up with PCP today at [place] screen The patient does not have any symptoms that suggest any further testing/ screening at this time.  Social distancing reinforced today.  Patient Risk: After full review of this patients clinical status, I feel that they are at moderate risk for cardiac decompensation at this time.  Relevant cardiac  medications were reviewed at length with the patient today. The patient does not  have concerns regarding their medications at this time.   The following changes were made today:  Elevate legs and use compression hose.  Recommended follow-up:  6 months. Dr Haroldine Laws  Today, I have spent 20 minutes with the patient with telehealth technology discussing the above issues .    Jeanmarie Hubert, NP  09/28/2018 2:18 PM  [facility] [address] [facility] [place] [address] [phone] (office) [phone] (fax)

## 2018-09-28 NOTE — Addendum Note (Signed)
Encounter addended by: Jovita Kussmaul, RN on: 09/28/2018 2:45 PM  Actions taken: Clinical Note Signed

## 2018-09-29 NOTE — Telephone Encounter (Signed)
Notified patient of message from Dr. Gherghe, patient expressed understanding and agreement. No further questions.  

## 2018-10-04 ENCOUNTER — Other Ambulatory Visit: Payer: Medicare HMO

## 2018-10-04 DIAGNOSIS — F064 Anxiety disorder due to known physiological condition: Secondary | ICD-10-CM | POA: Diagnosis not present

## 2018-10-04 DIAGNOSIS — F0632 Mood disorder due to known physiological condition with major depressive-like episode: Secondary | ICD-10-CM | POA: Diagnosis not present

## 2018-10-05 ENCOUNTER — Ambulatory Visit: Payer: Medicare HMO | Admitting: Podiatry

## 2018-10-05 ENCOUNTER — Other Ambulatory Visit: Payer: Self-pay

## 2018-10-05 ENCOUNTER — Other Ambulatory Visit (HOSPITAL_COMMUNITY): Payer: Self-pay

## 2018-10-05 VITALS — Temp 97.7°F

## 2018-10-05 DIAGNOSIS — B351 Tinea unguium: Secondary | ICD-10-CM | POA: Diagnosis not present

## 2018-10-05 DIAGNOSIS — M79674 Pain in right toe(s): Secondary | ICD-10-CM | POA: Diagnosis not present

## 2018-10-05 DIAGNOSIS — E1142 Type 2 diabetes mellitus with diabetic polyneuropathy: Secondary | ICD-10-CM | POA: Diagnosis not present

## 2018-10-05 DIAGNOSIS — M79675 Pain in left toe(s): Secondary | ICD-10-CM

## 2018-10-05 NOTE — Patient Instructions (Signed)
Fungal Nail Infection A fungal nail infection is a common infection of the toenails or fingernails. This condition affects toenails more often than fingernails. It often affects the great, or big, toes. More than one nail may be infected. The condition can be passed from person to person (is contagious). What are the causes? This condition is caused by a fungus. Several types of fungi can cause the infection. These fungi are common in moist and warm areas. If your hands or feet come into contact with the fungus, it may get into a crack in your fingernail or toenail and cause the infection. What increases the risk? The following factors may make you more likely to develop this condition:  Being female.  Being of older age.  Living with someone who has the fungus.  Walking barefoot in areas where the fungus thrives, such as showers or locker rooms.  Wearing shoes and socks that cause your feet to sweat.  Having a nail injury or a recent nail surgery.  Having certain medical conditions, such as: ? Athlete's foot. ? Diabetes. ? Psoriasis. ? Poor circulation. ? A weak body defense system (immune system). What are the signs or symptoms? Symptoms of this condition include:  A pale spot on the nail.  Thickening of the nail.  A nail that becomes yellow or brown.  A brittle or ragged nail edge.  A crumbling nail.  A nail that has lifted away from the nail bed. How is this diagnosed? This condition is diagnosed with a physical exam. Your health care provider may take a scraping or clipping from your nail to test for the fungus. How is this treated? Treatment is not needed for mild infections. If you have significant nail changes, treatment may include:  Antifungal medicines taken by mouth (orally). You may need to take the medicine for several weeks or several months, and you may not see the results for a long time. These medicines can cause side effects. Ask your health care provider  what problems to watch for.  Antifungal nail polish or nail cream. These may be used along with oral antifungal medicines.  Laser treatment of the nail.  Surgery to remove the nail. This may be needed for the most severe infections. It can take a long time, usually up to a year, for the infection to go away. The infection may also come back. Follow these instructions at home: Medicines  Take or apply over-the-counter and prescription medicines only as told by your health care provider.  Ask your health care provider about using over-the-counter mentholated ointment on your nails. Nail care  Trim your nails often.  Wash and dry your hands and feet every day.  Keep your feet dry: ? Wear absorbent socks, and change your socks frequently. ? Wear shoes that allow air to circulate, such as sandals or canvas tennis shoes. Throw out old shoes.  Do not use artificial nails.  If you go to a nail salon, make sure you choose one that uses clean instruments.  Use antifungal foot powder on your feet and in your shoes. General instructions  Do not share personal items, such as towels or nail clippers.  Do not walk barefoot in shower rooms or locker rooms.  Wear rubber gloves if you are working with your hands in wet areas.  Keep all follow-up visits as told by your health care provider. This is important. Contact a health care provider if: Your infection is not getting better or it is getting worse   after several months. Summary  A fungal nail infection is a common infection of the toenails or fingernails.  Treatment is not needed for mild infections. If you have significant nail changes, treatment may include taking medicine orally and applying medicine to your nails.  It can take a long time, usually up to a year, for the infection to go away. The infection may also come back.  Take or apply over-the-counter and prescription medicines only as told by your health care  provider.  Follow instructions for taking care of your nails to help prevent infection from coming back or spreading. This information is not intended to replace advice given to you by your health care provider. Make sure you discuss any questions you have with your health care provider. Document Released: 05/08/2000 Document Revised: 10/15/2017 Document Reviewed: 10/15/2017 Elsevier Interactive Patient Education  2019 [employer]  

## 2018-10-05 NOTE — Progress Notes (Signed)
Paramedicine Encounter    Patient ID: Rhonda [name], female    DOB: 01-Aug-1950, 68 y.o.   MRN: [id number]   Patient Care Team: Maurice Small, MD as PCP - General (Family Medicine) [name], LCSW as Social Worker (Licensed Clinical Social Worker)  Patient Active Problem List   Diagnosis Date Noted  . Pain, dental 08/31/2018  . Orthostatic hypotension 07/28/2017  . SVD (spontaneous vaginal delivery)   . Peripheral neuropathy   . On home oxygen therapy   . Migraines   . Left bundle branch block   . Hypothyroidism   . Hypertension   . Hyperlipidemia   . Heart murmur   . GERD (gastroesophageal reflux disease)   . Exertional shortness of breath   . Depression   . Complication of anesthesia   . Back pain   . Arthritis   . Anxiety   . Anemia   . HTN (hypertension) 09/14/2013  . Chronic respiratory failure ([place]) 09/14/2013  . 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 Morbid obesity ([place]) 11/01/2012  . Chronic systolic heart failure ([place]) 10/27/2012  . S/P right TKA 05/10/2012  . Endometrial polyp 01/20/2012  . Breast cancer, stage 1 ([place]) 03/26/2011  . Unspecified vitamin D deficiency 03/26/2011    Current Outpatient Medications:  .  acetaZOLAMIDE (DIAMOX) 125 MG tablet, TAKE 1 TABLET(125 MG) BY MOUTH DAILY, Disp: 30 tablet, Rfl: 5 .  buPROPion (WELLBUTRIN XL) 150 MG 24 hr tablet, Take 450 mg by mouth every morning. , Disp: , Rfl:  .  carboxymethylcellulose (REFRESH PLUS) 0.5 % SOLN, Place 1 drop into both eyes 3 (three) times daily as needed (dry eyes)., Disp: , Rfl:  .  carvedilol (COREG) 3.125 MG tablet, Take 3.125 mg by mouth 2 (two) times daily with a meal., Disp: , Rfl:  .  citalopram (CELEXA) 20 MG tablet, Take 20 mg by mouth daily., Disp: , Rfl:  .  clonazePAM (KLONOPIN) 0.5 MG tablet, Take 0.5 mg by mouth 3 (three) times daily as needed for anxiety. , Disp: , Rfl:  .  colestipol (COLESTID) 1 g tablet, Take by mouth 2 (two)  times daily. , Disp: , Rfl:  .  fexofenadine (ALLEGRA) 180 MG tablet, Take 180 mg by mouth 2 (two) times daily., Disp: , Rfl:  .  gabapentin (NEURONTIN) 300 MG capsule, Take 300 mg by mouth 3 (three) times daily. , Disp: , Rfl:  .  gabapentin (NEURONTIN) 800 MG tablet, Take 800 mg by mouth at bedtime as needed (neuropathy). , Disp: , Rfl:  .  ipratropium (ATROVENT) 0.03 % nasal spray, Place 2 sprays into both nostrils 2 (two) times daily., Disp: , Rfl:  .  levothyroxine (SYNTHROID, LEVOTHROID) 100 MCG tablet, TAKE 1 TABLET(100 MCG) BY MOUTH DAILY (Patient taking differently: Take 100 mcg by mouth daily. ), Disp: 45 tablet, Rfl: 3 .  midodrine (PROAMATINE) 10 MG tablet, Take 1 tablet (10 mg total) by mouth 3 (three) times daily with meals., Disp: 90 tablet, Rfl: 3 .  montelukast (SINGULAIR) 10 MG tablet, Take 10 mg by mouth at bedtime. , Disp: , Rfl: 1 .  omeprazole (PRILOSEC) 40 MG capsule, TK 1 C PO QD 30 MIN B BRE FOR 7 DAYS, Disp: , Rfl:  .  ondansetron (ZOFRAN) 8 MG tablet, TK 1 T PO BID FOR 10 DAYS PRF NAUSEA, Disp: , Rfl:  .  polyethylene glycol (MIRALAX / GLYCOLAX) packet, Take 17 g by mouth daily as needed for moderate constipation., Disp: ,  Rfl:  .  polyethylene glycol powder (GLYCOLAX/MIRALAX) 17 GM/SCOOP powder, DISSOLVE 1 CAPFUL IN 8 OUNCES OF LIQUID AND TK PO ONCE A DAY UTD, Disp: , Rfl:  .  potassium chloride SA (K-DUR,KLOR-CON) 20 MEQ tablet, Take 3 tablets (60 mEq total) by mouth 2 (two) times daily., Disp: 180 tablet, Rfl: 11 .  predniSONE (DELTASONE) 20 MG tablet, TK 2 TS PO QD FOR 5 DAYS, Disp: , Rfl:  .  probenecid (BENEMID) 500 MG tablet, TK 1 T PO BID, Disp: , Rfl:  .  simvastatin (ZOCOR) 10 MG tablet, Take 10 mg by mouth every evening. , Disp: , Rfl:  .  spironolactone (ALDACTONE) 25 MG tablet, Take 0.5 tablets (12.5 mg total) by mouth every evening., Disp: 45 tablet, Rfl: 1 .  torsemide (DEMADEX) 20 MG tablet, Take 3 tablets (60 mg total) by mouth daily., Disp: 180 tablet,  Rfl: 3 .  zolpidem (AMBIEN) 10 MG tablet, Take 10 mg by mouth at bedtime as needed for sleep. For sleep , Disp: , Rfl:  .  clindamycin (CLEOCIN) 300 MG capsule, Take 600 mg by mouth See admin instructions. Take 2 capsules (600 mg) by mouth 1 hour prior to dental procedure., Disp: , Rfl:  .  doxycycline (MONODOX) 100 MG capsule, TK 1 C PO BID WF FOR 10 DAYS, Disp: , Rfl:  .  metolazone (ZAROXOLYN) 2.5 MG tablet, take 1 tablet by mouth if needed for FLUID OR EDEMA (Patient not taking: Reported on 10/05/2018), Disp: 30 tablet, Rfl: 2 Allergies  Allergen Reactions  . Ceftin Anaphylaxis    Face and throat swell   . Shellfish Allergy Other (See Comments)    Gout exacerbation  . Allopurinol Nausea Only and Other (See Comments)    weakness  . Ativan [Lorazepam] Itching  . Sulfa Antibiotics Itching  . Ultram [Tramadol Hcl] Itching      Social History   Socioeconomic History  . Marital status: Married    Spouse name: Not on file  . Number of children: 2  . Years of education: masters  . Highest education level: Not on file  Occupational History  . Not on file  Social Needs  . Financial resource strain: Not on file  . Food insecurity:    Worry: Not on file    Inability: Not on file  . Transportation needs:    Medical: Not on file    Non-medical: Not on file  Tobacco Use  . Smoking status: Former Smoker    Packs/day: 0.10    Years: 26.00    Pack years: 2.60    Types: Cigarettes    Last attempt to quit: 05/06/2007    Years since quitting: 11.4  . Smokeless tobacco: Never Used  Substance and Sexual Activity  . Alcohol use: No  . Drug use: No  . Sexual activity: Yes  Lifestyle  . Physical activity:    Days per week: Not on file    Minutes per session: Not on file  . Stress: Not on file  Relationships  . Social connections:    Talks on phone: Not on file    Gets together: Not on file    Attends religious service: Not on file    Active member of club or organization: Not on  file    Attends meetings of clubs or organizations: Not on file    Relationship status: Not on file  . Intimate partner violence:    Fear of current or ex partner: Not on file  Emotionally abused: Not on file    Physically abused: Not on file    Forced sexual activity: Not on file  Other Topics Concern  . Not on file  Social History Narrative   Tobacco Use Cigarettes: Former Smoker, Quit in 2008   No Alcohol   No recreational drug use   Diet: Regular/Low Carb   Exercise: None   Occupation: disabled   Education: Research officer, political party, masters   Children: 2   Firearms: No   Therapist, art Use: Always   Former Metallurgist.        Physical Exam      Future Appointments  Date Time Provider [place]  10/06/2018  9:00 AM [facility] [place] [facility]  10/12/2018  9:30 AM [facility] DEVICE REMOTES [facility]  10/18/2018  8:45 AM [facility] DEVICE REMOTES [facility]  12/07/2018  7:50 AM [name], Arvin Collard K, DO [facility] None  12/08/2018 10:15 AM Philemon Kingdom, MD [facility] None  01/11/2019  1:30 PM [place], Stephani Police, DPM [facility]    BP 124/62   Pulse 68   Temp (!) 96.8 F (36 C)   Resp 16   SpO2 98%   Weight yesterday-170 Last visit weight-166  Pt reports she is doing ok, she has finished her round of antibiotics but still taking prednisone--she did not weigh this morning due to an early doc appointment but feels she has some gained some weight b/c of the prednisone. Her legs are puffy, but im not sure what is normal for her as she has hx of that. I told her to weigh in the morning and then to call me to see how much extra she may have.  She denies increased sob, no dizziness, however she reports frequent falls/trips/stumblling. This is ongoing as well. Pt denies c/p. Pt has numerous bottles of same meds-those were consolidated and the ones that were expired were trashed. Pt reports she has been putting labels on the tops of the med  bottles b/c if she needs one of those it would be easier to find--I asked her why is she searching for any meds that are already in her pill box--she couldn't really give a straight answer. I left out the meds that she could take as needed-the zofran the antibiotic for when she goes to dentist and her ambien---the others that are placed in her pill box I told her to put that box away and do not meddle in them--she has done this in the past too  And then places other pills in her pillbox as she gets confused easily.   meds verified and pill box refilled-she wants her levothyroxine in the separate blue pill box so she can take it before breakfast and before her other meds.  --she needs refills on acetazolamide--[phone] --she needs refills on gabapentin 800mg [phone] --she needs refill of metolazone-the one she had was expired so we trashed it. She wants it in case she needs it however that is concerning since she meddles in her meds.  Those meds listed above were ordered for refills.   [name], [place] [facility] Paramedic  10/06/18

## 2018-10-06 ENCOUNTER — Other Ambulatory Visit: Payer: Medicare HMO

## 2018-10-06 ENCOUNTER — Other Ambulatory Visit: Payer: Self-pay

## 2018-10-06 ENCOUNTER — Other Ambulatory Visit: Payer: Self-pay | Admitting: Neurology

## 2018-10-06 ENCOUNTER — Ambulatory Visit
Admission: RE | Admit: 2018-10-06 | Discharge: 2018-10-06 | Disposition: A | Discharge status: Home / Self Care | Payer: Medicare HMO | Source: Ambulatory Visit | Attending: Gastroenterology | Admitting: Gastroenterology

## 2018-10-06 DIAGNOSIS — K8020 Calculus of gallbladder without cholecystitis without obstruction: Secondary | ICD-10-CM | POA: Diagnosis not present

## 2018-10-06 DIAGNOSIS — R932 Abnormal findings on diagnostic imaging of liver and biliary tract: Secondary | ICD-10-CM

## 2018-10-12 ENCOUNTER — Other Ambulatory Visit (HOSPITAL_COMMUNITY): Payer: Self-pay

## 2018-10-12 ENCOUNTER — Telehealth: Payer: Self-pay

## 2018-10-12 ENCOUNTER — Ambulatory Visit (INDEPENDENT_AMBULATORY_CARE_PROVIDER_SITE_OTHER): Payer: Medicare HMO

## 2018-10-12 ENCOUNTER — Other Ambulatory Visit: Payer: Self-pay

## 2018-10-12 DIAGNOSIS — I5022 Chronic systolic (congestive) heart failure: Secondary | ICD-10-CM

## 2018-10-12 DIAGNOSIS — Z95810 Presence of automatic (implantable) cardiac defibrillator: Secondary | ICD-10-CM | POA: Diagnosis not present

## 2018-10-12 DIAGNOSIS — F0632 Mood disorder due to known physiological condition with major depressive-like episode: Secondary | ICD-10-CM | POA: Diagnosis not present

## 2018-10-12 NOTE — Telephone Encounter (Signed)
Remote ICM transmission received.  Attempted call to patient regarding ICM remote transmission and no answer, no message left.

## 2018-10-12 NOTE — Progress Notes (Signed)
EPIC Encounter for ICM Monitoring  Patient Name: Rhonda [name] is a 68 y.o. female Date: 10/12/2018 Primary Care Physican: Maurice Small, MD Primary Cardiologist:Bensimhon Electrophysiologist: Santina Evans Pacing: 100% LastWeight:178lbs  4/13/2020Weight:167.2lbs   Attempted call to patient and unable to reach.   Transmission reviewed.   Thoracic impedanceabnormalsuggesting fluid accumulation since ~09/27/2018.  Prescribed:Torsemide 20 mg3tablets (60 mg total)by mouth daily.Potassium 20 meq 3tablets (60 mEq total)twice a day. Metolazone 2.5mg  1 tablet by mouth if needed for fluid or edema.  Labs: 07/26/2018 Creatinine1.90Nicky Pugh, Potassium3.5, Sodium141, [id number] 07/18/2018 Creatinine1.43, BUN21, Potassium3.7, [id number], OMV67-20  06/30/2018 Creatinine1.75, BUN27, Potassium3.0, [id number], [id number] 03/30/2018 Creatinine 1.63, BUN 26, Potassium 3.9, Sodium 139, GFR 32-37 02/10/2018 Creatinine 1.85, BUN 41, Potassium 3.9, Sodium 138, GFR 28-33 10/29/2017 Creatinine 1.45, BUN 24, Potassium 3.9, Sodium 141, GFR 37-43 10/21/2017 Creatinine 1.57, BUN 19, Potassium 3.8, Sodium 140, GFR 34-39 09/23/2017 Creatinine 1.26, BUN 29, Potassium 3.2, Sodium 142, GFR 49-56  Recommendations:  Unable to reach.  Follow-up plan: ICM clinic phone appointment on 10/25/2018 to recheck fluid levels.   Copy of ICM check sent to Dr.Taylor and Dr Haroldine Laws.  3 month ICM trend: 10/12/2018    1 Year ICM trend:       [name], RN 10/12/2018 11:55 AM

## 2018-10-12 NOTE — Progress Notes (Signed)
Paramedicine Encounter    Patient ID: Rhonda [name], female    DOB: 06-Mar-1951, 68 y.o.   MRN: [id number]    Patient Care Team: Maurice Small, MD as PCP - General (Family Medicine) [name], LCSW as Social Worker (Licensed Clinical Social Worker)  Patient Active Problem List   Diagnosis Date Noted  . Pain, dental 08/31/2018  . Orthostatic hypotension 07/28/2017  . SVD (spontaneous vaginal delivery)   . Peripheral neuropathy   . On home oxygen therapy   . Migraines   . Left bundle branch block   . Hypothyroidism   . Hypertension   . Hyperlipidemia   . Heart murmur   . GERD (gastroesophageal reflux disease)   . Exertional shortness of breath   . Depression   . Complication of anesthesia   . Back pain   . Arthritis   . Anxiety   . Anemia   . HTN (hypertension) 09/14/2013  . Chronic respiratory failure ([place]) 09/14/2013  . 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 Morbid obesity ([place]) 11/01/2012  . Chronic systolic heart failure ([place]) 10/27/2012  . S/P right TKA 05/10/2012  . Endometrial polyp 01/20/2012  . Breast cancer, stage 1 ([place]) 03/26/2011  . Unspecified vitamin D deficiency 03/26/2011    Current Outpatient Medications:  .  acetaZOLAMIDE (DIAMOX) 125 MG tablet, TAKE 1 TABLET(125 MG) BY MOUTH DAILY, Disp: 30 tablet, Rfl: 5 .  buPROPion (WELLBUTRIN XL) 150 MG 24 hr tablet, Take 450 mg by mouth every morning. , Disp: , Rfl:  .  carvedilol (COREG) 3.125 MG tablet, Take 3.125 mg by mouth 2 (two) times daily with a meal., Disp: , Rfl:  .  citalopram (CELEXA) 20 MG tablet, Take 20 mg by mouth daily., Disp: , Rfl:  .  clonazePAM (KLONOPIN) 0.5 MG tablet, Take 0.5 mg by mouth 3 (three) times daily as needed for anxiety. , Disp: , Rfl:  .  colestipol (COLESTID) 1 g tablet, Take by mouth 2 (two) times daily. , Disp: , Rfl:  .  fexofenadine (ALLEGRA) 180 MG tablet, Take 180 mg by mouth 2 (two) times daily., Disp: , Rfl:  .  gabapentin  (NEURONTIN) 300 MG capsule, Take 300 mg by mouth 3 (three) times daily. , Disp: , Rfl:  .  gabapentin (NEURONTIN) 800 MG tablet, Take 800 mg by mouth at bedtime as needed (neuropathy). , Disp: , Rfl:  .  ipratropium (ATROVENT) 0.03 % nasal spray, Place 2 sprays into both nostrils 2 (two) times daily., Disp: , Rfl:  .  levothyroxine (SYNTHROID, LEVOTHROID) 100 MCG tablet, TAKE 1 TABLET(100 MCG) BY MOUTH DAILY (Patient taking differently: Take 100 mcg by mouth daily. ), Disp: 45 tablet, Rfl: 3 .  midodrine (PROAMATINE) 10 MG tablet, Take 1 tablet (10 mg total) by mouth 3 (three) times daily with meals., Disp: 90 tablet, Rfl: 3 .  montelukast (SINGULAIR) 10 MG tablet, Take 10 mg by mouth at bedtime. , Disp: , Rfl: 1 .  potassium chloride SA (K-DUR,KLOR-CON) 20 MEQ tablet, Take 3 tablets (60 mEq total) by mouth 2 (two) times daily., Disp: 180 tablet, Rfl: 11 .  probenecid (BENEMID) 500 MG tablet, TK 1 T PO BID, Disp: , Rfl:  .  simvastatin (ZOCOR) 10 MG tablet, Take 10 mg by mouth every evening. , Disp: , Rfl:  .  spironolactone (ALDACTONE) 25 MG tablet, Take 0.5 tablets (12.5 mg total) by mouth every evening., Disp: 45 tablet, Rfl: 1 .  torsemide (DEMADEX) 20 MG tablet,  Take 3 tablets (60 mg total) by mouth daily., Disp: 180 tablet, Rfl: 3 .  zolpidem (AMBIEN) 10 MG tablet, Take 10 mg by mouth at bedtime as needed for sleep. For sleep , Disp: , Rfl:  .  carboxymethylcellulose (REFRESH PLUS) 0.5 % SOLN, Place 1 drop into both eyes 3 (three) times daily as needed (dry eyes)., Disp: , Rfl:  .  clindamycin (CLEOCIN) 300 MG capsule, Take 600 mg by mouth See admin instructions. Take 2 capsules (600 mg) by mouth 1 hour prior to dental procedure., Disp: , Rfl:  .  doxycycline (MONODOX) 100 MG capsule, TK 1 C PO BID WF FOR 10 DAYS, Disp: , Rfl:  .  metolazone (ZAROXOLYN) 2.5 MG tablet, take 1 tablet by mouth if needed for FLUID OR EDEMA (Patient not taking: Reported on 10/05/2018), Disp: 30 tablet, Rfl: 2 .   omeprazole (PRILOSEC) 40 MG capsule, TK 1 C PO QD 30 MIN B BRE FOR 7 DAYS, Disp: , Rfl:  .  ondansetron (ZOFRAN) 8 MG tablet, TK 1 T PO BID FOR 10 DAYS PRF NAUSEA, Disp: , Rfl:  .  polyethylene glycol (MIRALAX / GLYCOLAX) packet, Take 17 g by mouth daily as needed for moderate constipation., Disp: , Rfl:  .  polyethylene glycol powder (GLYCOLAX/MIRALAX) 17 GM/SCOOP powder, DISSOLVE 1 CAPFUL IN 8 OUNCES OF LIQUID AND TK PO ONCE A DAY UTD, Disp: , Rfl:  .  predniSONE (DELTASONE) 20 MG tablet, TK 2 TS PO QD FOR 5 DAYS, Disp: , Rfl:  Allergies  Allergen Reactions  . Ceftin Anaphylaxis    Face and throat swell   . Shellfish Allergy Other (See Comments)    Gout exacerbation  . Allopurinol Nausea Only and Other (See Comments)    weakness  . Ativan [Lorazepam] Itching  . Sulfa Antibiotics Itching  . Ultram [Tramadol Hcl] Itching     Social History   Socioeconomic History  . Marital status: Married    Spouse name: Not on file  . Number of children: 2  . Years of education: masters  . Highest education level: Not on file  Occupational History  . Not on file  Social Needs  . Financial resource strain: Not on file  . Food insecurity:    Worry: Not on file    Inability: Not on file  . Transportation needs:    Medical: Not on file    Non-medical: Not on file  Tobacco Use  . Smoking status: Former Smoker    Packs/day: 0.10    Years: 26.00    Pack years: 2.60    Types: Cigarettes    Last attempt to quit: 05/06/2007    Years since quitting: 11.4  . Smokeless tobacco: Never Used  Substance and Sexual Activity  . Alcohol use: No  . Drug use: No  . Sexual activity: Yes  Lifestyle  . Physical activity:    Days per week: Not on file    Minutes per session: Not on file  . Stress: Not on file  Relationships  . Social connections:    Talks on phone: Not on file    Gets together: Not on file    Attends religious service: Not on file    Active member of club or organization: Not on  file    Attends meetings of clubs or organizations: Not on file    Relationship status: Not on file  . Intimate partner violence:    Fear of current or ex partner: Not on file  Emotionally abused: Not on file    Physically abused: Not on file    Forced sexual activity: Not on file  Other Topics Concern  . Not on file  Social History Narrative   Tobacco Use Cigarettes: Former Smoker, Quit in 2008   No Alcohol   No recreational drug use   Diet: Regular/Low Carb   Exercise: None   Occupation: disabled   Education: Research officer, political party, masters   Children: 2   Firearms: No   Therapist, art Use: Always   Former Metallurgist.        Physical Exam Pulmonary:     Effort: Pulmonary effort is normal. No respiratory distress.     Breath sounds: No wheezing or rales.  Abdominal:     General: There is distension.     Tenderness: There is no abdominal tenderness. There is no guarding.  Musculoskeletal:        General: Swelling present.     Right lower leg: Edema present.     Left lower leg: Edema present.  Skin:    General: Skin is warm and dry.         Future Appointments  Date Time Provider [place]  10/18/2018  8:45 AM [facility]OT[facility]  10/25/2018  8:45 AM [facility] DEVICE REMOTES [facility]  12/07/2018  7:50 AM Narda Amber K, DO [facility] None  12/08/2018 10:15 AM Philemon Kingdom, MD [facility] None  01/11/2019  1:30 PM [place], Stephani Police, DPM [facility]     BP 104/70 (BP Location: Right Arm, Patient Position: Sitting, Cuff Size: Large)   Pulse 88   Temp 98.4 F (36.9 C)   Wt 173 lb (78.5 kg)   SpO2 98%   BMI 33.79 kg/m   Weight yesterday-173 Last visit weight-166 (09/28/2018)  ATF pt CAO to her norm standing in the kitchen, her husband and daughter on scene.  Pt stated that she feels "terrible, because she has to get her gallbladder removed". She stated that the nurse from her pcp called and stated that  she has "multiple gallstones and she'll be called to schedule a day to have them remove". Pt is very concerned because she isn't experiencing pain.  I looked back in [place] notes that pt's pcp Justin Mend has addressed "stomach issues" a couple of weeks ago. Pt denies sob, chest pain and dizziness.  Edema noted to both lower legs, more on the left leg and she's c/o edema in her thighs she stated that her legs are too big to wear jeans. She stated that her legs aren't pain although they feel warm and tight.  Pt has swelling in her abdomen, she denies pain, diarrhea and nausea. Pt's vitals noted.   Rx bottles verified and pill box refilled.  I spoke with Joellen Jersey and she advised me that pt had several pill bottles that were expired in a box last week. She stated that the pill bottles were thrown away. I contacted pt's husband (Ed) about the same, to see if she has a stash of meds that we don't know about.       Medication ordered: Ambien Acetazolamide fille until thursday [id number] Citalopram [id number] [place], EMT Paramedic [phone] 10/12/2018    ACTION: Home visit completed

## 2018-10-13 ENCOUNTER — Encounter: Payer: Self-pay | Admitting: Podiatry

## 2018-10-13 NOTE — Progress Notes (Signed)
Subjective: Rhonda [name] presents history of diabetic neuropathy for preventative diabetic foot care today.  She is seen for chronically painful mycotic toenails which are aggravated when wearing enclosed shoe gear. Pain is relieved with periodic professional debridement.  She voices no new pedal concerns on today's visit.  Maurice Small, MD is her PCP.   Current Outpatient Medications:  .  buPROPion (WELLBUTRIN XL) 150 MG 24 hr tablet, Take 450 mg by mouth every morning. , Disp: , Rfl:  .  carboxymethylcellulose (REFRESH PLUS) 0.5 % SOLN, Place 1 drop into both eyes 3 (three) times daily as needed (dry eyes)., Disp: , Rfl:  .  carvedilol (COREG) 3.125 MG tablet, Take 3.125 mg by mouth 2 (two) times daily with a meal., Disp: , Rfl:  .  citalopram (CELEXA) 20 MG tablet, Take 20 mg by mouth daily., Disp: , Rfl:  .  clindamycin (CLEOCIN) 300 MG capsule, Take 600 mg by mouth See admin instructions. Take 2 capsules (600 mg) by mouth 1 hour prior to dental procedure., Disp: , Rfl:  .  clonazePAM (KLONOPIN) 0.5 MG tablet, Take 0.5 mg by mouth 3 (three) times daily as needed for anxiety. , Disp: , Rfl:  .  colestipol (COLESTID) 1 g tablet, Take by mouth 2 (two) times daily. , Disp: , Rfl:  .  doxycycline (MONODOX) 100 MG capsule, TK 1 C PO BID WF FOR 10 DAYS, Disp: , Rfl:  .  fexofenadine (ALLEGRA) 180 MG tablet, Take 180 mg by mouth 2 (two) times daily., Disp: , Rfl:  .  gabapentin (NEURONTIN) 300 MG capsule, Take 300 mg by mouth 3 (three) times daily. , Disp: , Rfl:  .  gabapentin (NEURONTIN) 800 MG tablet, Take 800 mg by mouth at bedtime as needed (neuropathy). , Disp: , Rfl:  .  ipratropium (ATROVENT) 0.03 % nasal spray, Place 2 sprays into both nostrils 2 (two) times daily., Disp: , Rfl:  .  levothyroxine (SYNTHROID, LEVOTHROID) 100 MCG tablet, TAKE 1 TABLET(100 MCG) BY MOUTH DAILY (Patient taking differently: Take 100 mcg by mouth daily. ), Disp: 45 tablet, Rfl: 3 .  metolazone (ZAROXOLYN) 2.5 MG  tablet, take 1 tablet by mouth if needed for FLUID OR EDEMA (Patient not taking: Reported on 10/05/2018), Disp: 30 tablet, Rfl: 2 .  midodrine (PROAMATINE) 10 MG tablet, Take 1 tablet (10 mg total) by mouth 3 (three) times daily with meals., Disp: 90 tablet, Rfl: 3 .  montelukast (SINGULAIR) 10 MG tablet, Take 10 mg by mouth at bedtime. , Disp: , Rfl: 1 .  omeprazole (PRILOSEC) 40 MG capsule, TK 1 C PO QD 30 MIN B BRE FOR 7 DAYS, Disp: , Rfl:  .  ondansetron (ZOFRAN) 8 MG tablet, TK 1 T PO BID FOR 10 DAYS PRF NAUSEA, Disp: , Rfl:  .  polyethylene glycol (MIRALAX / GLYCOLAX) packet, Take 17 g by mouth daily as needed for moderate constipation., Disp: , Rfl:  .  polyethylene glycol powder (GLYCOLAX/MIRALAX) 17 GM/SCOOP powder, DISSOLVE 1 CAPFUL IN 8 OUNCES OF LIQUID AND TK PO ONCE A DAY UTD, Disp: , Rfl:  .  potassium chloride SA (K-DUR,KLOR-CON) 20 MEQ tablet, Take 3 tablets (60 mEq total) by mouth 2 (two) times daily., Disp: 180 tablet, Rfl: 11 .  predniSONE (DELTASONE) 20 MG tablet, TK 2 TS PO QD FOR 5 DAYS, Disp: , Rfl:  .  probenecid (BENEMID) 500 MG tablet, TK 1 T PO BID, Disp: , Rfl:  .  simvastatin (ZOCOR) 10 MG tablet, Take  10 mg by mouth every evening. , Disp: , Rfl:  .  spironolactone (ALDACTONE) 25 MG tablet, Take 0.5 tablets (12.5 mg total) by mouth every evening., Disp: 45 tablet, Rfl: 1 .  torsemide (DEMADEX) 20 MG tablet, Take 3 tablets (60 mg total) by mouth daily., Disp: 180 tablet, Rfl: 3 .  zolpidem (AMBIEN) 10 MG tablet, Take 10 mg by mouth at bedtime as needed for sleep. For sleep , Disp: , Rfl:  .  acetaZOLAMIDE (DIAMOX) 125 MG tablet, TAKE 1 TABLET(125 MG) BY MOUTH DAILY, Disp: 30 tablet, Rfl: 5  Allergies  Allergen Reactions  . Ceftin Anaphylaxis    Face and throat swell   . Shellfish Allergy Other (See Comments)    Gout exacerbation  . Allopurinol Nausea Only and Other (See Comments)    weakness  . Ativan [Lorazepam] Itching  . Sulfa Antibiotics Itching  . Ultram  [Tramadol Hcl] Itching    Vascular Examination: Capillary refill time immediate x10 digits.  Dorsalis pedis pulses 1/4 bilaterally.  Posterior tibial pulses 1/4 bilaterally.  Digital hair absent x 10 digits.  Skin temperature gradient within normal limits b/l.  Dermatological Examination: Skin with normal turgor, texture and tone b/l.  Toenails 1-5 b/l discolored, thick, dystrophic with subungual debris and pain with palpation to nailbeds due to thickness of nails.  Musculoskeletal: Muscle strength 5/5 to all LE muscle groups.  Severe pes planus foot deformity noted bilaterally.  Decreased ankle joint range of motion noted bilaterally.  Neurological: Sensation diminished with 10 gram monofilament.  Vibratory sensation diminished.  Assessment: 1. Painful onychomycosis toenails 1-5 b/l 2. NIDDM with Diabetic neuropathy  Plan: 1. Continue diabetic foot care principles. Literature dispensed on today. 2. Toenails 1-5 b/l were debrided in length and girth without iatrogenic bleeding. 3. Patient to continue soft, supportive shoe gear daily. 4. Patient to report any pedal injuries to medical professional. 5. Follow up 3 months.  6. Patient/POA to call should there be a concern in the interim.

## 2018-10-18 ENCOUNTER — Telehealth (HOSPITAL_COMMUNITY): Payer: Self-pay | Admitting: Cardiology

## 2018-10-18 ENCOUNTER — Ambulatory Visit (INDEPENDENT_AMBULATORY_CARE_PROVIDER_SITE_OTHER): Payer: Medicare HMO | Admitting: *Deleted

## 2018-10-18 DIAGNOSIS — F3289 Other specified depressive episodes: Secondary | ICD-10-CM | POA: Diagnosis not present

## 2018-10-18 DIAGNOSIS — I5022 Chronic systolic (congestive) heart failure: Secondary | ICD-10-CM | POA: Diagnosis not present

## 2018-10-18 DIAGNOSIS — F0632 Mood disorder due to known physiological condition with major depressive-like episode: Secondary | ICD-10-CM | POA: Diagnosis not present

## 2018-10-18 DIAGNOSIS — I428 Other cardiomyopathies: Secondary | ICD-10-CM

## 2018-10-18 LAB — CUP PACEART REMOTE DEVICE CHECK
Battery Remaining Longevity: 18 mo
Battery Voltage: 2.9 V
Brady Statistic AP VP Percent: 0.03 %
Brady Statistic AP VS Percent: 0.01 %
Brady Statistic AS VP Percent: 99.94 %
Brady Statistic AS VS Percent: 0.02 %
Brady Statistic RA Percent Paced: 0.04 %
Brady Statistic RV Percent Paced: 99.97 %
Date Time Interrogation Session: 20200526041608
HighPow Impedance: 54 Ohm
Implantable Lead Implant Date: 20141210
Implantable Lead Implant Date: 20141210
Implantable Lead Implant Date: 20141210
Implantable Lead Location: 753858
Implantable Lead Location: 753859
Implantable Lead Location: 753860
Implantable Lead Model: 4396
Implantable Lead Model: 5076
Implantable Lead Model: 6935
Implantable Pulse Generator Implant Date: 20141210
Lead Channel Impedance Value: 380 Ohm
Lead Channel Impedance Value: 380 Ohm
Lead Channel Impedance Value: 456 Ohm
Lead Channel Impedance Value: 513 Ohm
Lead Channel Impedance Value: 589 Ohm
Lead Channel Impedance Value: 627 Ohm
Lead Channel Pacing Threshold Amplitude: 0.625 V
Lead Channel Pacing Threshold Amplitude: 1 V
Lead Channel Pacing Threshold Amplitude: 1.125 V
Lead Channel Pacing Threshold Pulse Width: 0.4 ms
Lead Channel Pacing Threshold Pulse Width: 0.4 ms
Lead Channel Pacing Threshold Pulse Width: 0.4 ms
Lead Channel Sensing Intrinsic Amplitude: 2.875 mV
Lead Channel Sensing Intrinsic Amplitude: 2.875 mV
Lead Channel Sensing Intrinsic Amplitude: 20.875 mV
Lead Channel Sensing Intrinsic Amplitude: 20.875 mV
Lead Channel Setting Pacing Amplitude: 2 V
Lead Channel Setting Pacing Amplitude: 2.25 V
Lead Channel Setting Pacing Amplitude: 2.5 V
Lead Channel Setting Pacing Pulse Width: 0.4 ms
Lead Channel Setting Pacing Pulse Width: 0.4 ms
Lead Channel Setting Sensing Sensitivity: 0.3 mV

## 2018-10-18 NOTE — Telephone Encounter (Signed)
Please call increase torsemide to 60 mg twice a day x 2 days then back to 60 mg daily. Take an extra 20 meq of potassium for 2 days.  Limit fluid intake to < 2 liters per day.   Set up for repeat BMET.   Javaughn Opdahl NP-C  4:42 PM

## 2018-10-18 NOTE — Telephone Encounter (Signed)
Patient called with concerns with BLE edema L>R swelling into knees, c/o redness warm to touch Patient reports she does wear ted hoses as long as she can tolerate Weight today- 178 (stable)  Reports she is taking medications daily Still very nauseous, will be scheduled for gallstone removal later in the week   Please advise  ?metolazone

## 2018-10-19 ENCOUNTER — Other Ambulatory Visit (HOSPITAL_COMMUNITY): Payer: Self-pay

## 2018-10-19 MED ORDER — TORSEMIDE 20 MG PO TABS: 60.0000 mg | ORAL_TABLET | Freq: Two times a day (BID) | ORAL | 3 refills | Status: DC

## 2018-10-19 NOTE — Telephone Encounter (Signed)
Patient returned call to report she is aware of instructions however reports she is currently taking 60 bid, per Darrick Grinder NP increase to 80 BID x 2 days with additional 20 meq of potassium x 2 days, repeat labs x 1 week  Patient aware and message to [facility] with para medicine to assist with medications/pill box   Labs 6/4 @ 145

## 2018-10-19 NOTE — Addendum Note (Signed)
Addended by: Kerry Dory on: 10/19/2018 12:59 PM   Modules accepted: Orders

## 2018-10-19 NOTE — Telephone Encounter (Signed)
Attempted to return call Detailed message left for patient with instructions. Advised to return call to confirm she received instructions and to schedule lab appt

## 2018-10-19 NOTE — Progress Notes (Signed)
Paramedicine Encounter    Patient ID: Rhonda [name], female    DOB: [birth year], 68 y.o.   MRN: [id number]    Patient Care Team: Maurice Small, MD as PCP - General (Family Medicine) [name], LCSW as Social Worker (Licensed Clinical Social Worker)  Patient Active Problem List   Diagnosis Date Noted  . Pain, dental 08/31/2018  . Orthostatic hypotension 07/28/2017  . SVD (spontaneous vaginal delivery)   . Peripheral neuropathy   . On home oxygen therapy   . Migraines   . Left bundle branch block   . Hypothyroidism   . Hypertension   . Hyperlipidemia   . Heart murmur   . GERD (gastroesophageal reflux disease)   . Exertional shortness of breath   . Depression   . Complication of anesthesia   . Back pain   . Arthritis   . Anxiety   . Anemia   . HTN (hypertension) 09/14/2013  . Chronic respiratory failure ([place]) 09/14/2013  . 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 Morbid obesity ([place]) 11/01/2012  . Chronic systolic heart failure ([place]) 10/27/2012  . S/P right TKA 05/10/2012  . Endometrial polyp 01/20/2012  . Breast cancer, stage 1 ([place]) 03/26/2011  . Unspecified vitamin D deficiency 03/26/2011    Current Outpatient Medications:  .  acetaZOLAMIDE (DIAMOX) 125 MG tablet, TAKE 1 TABLET(125 MG) BY MOUTH DAILY, Disp: 30 tablet, Rfl: 5 .  buPROPion (WELLBUTRIN XL) 150 MG 24 hr tablet, Take 450 mg by mouth every morning. , Disp: , Rfl:  .  carvedilol (COREG) 3.125 MG tablet, Take 3.125 mg by mouth 2 (two) times daily with a meal., Disp: , Rfl:  .  citalopram (CELEXA) 20 MG tablet, Take 20 mg by mouth daily., Disp: , Rfl:  .  clonazePAM (KLONOPIN) 0.5 MG tablet, Take 0.5 mg by mouth 3 (three) times daily as needed for anxiety. , Disp: , Rfl:  .  colestipol (COLESTID) 1 g tablet, Take by mouth 2 (two) times daily. , Disp: , Rfl:  .  fexofenadine (ALLEGRA) 180 MG tablet, Take 180 mg by mouth 2 (two) times daily., Disp: , Rfl:  .  gabapentin  (NEURONTIN) 300 MG capsule, Take 300 mg by mouth 3 (three) times daily. , Disp: , Rfl:  .  levothyroxine (SYNTHROID, LEVOTHROID) 100 MCG tablet, TAKE 1 TABLET(100 MCG) BY MOUTH DAILY (Patient taking differently: Take 100 mcg by mouth daily. ), Disp: 45 tablet, Rfl: 3 .  midodrine (PROAMATINE) 10 MG tablet, Take 1 tablet (10 mg total) by mouth 3 (three) times daily with meals., Disp: 90 tablet, Rfl: 3 .  montelukast (SINGULAIR) 10 MG tablet, Take 10 mg by mouth at bedtime. , Disp: , Rfl: 1 .  potassium chloride SA (K-DUR,KLOR-CON) 20 MEQ tablet, Take 3 tablets (60 mEq total) by mouth 2 (two) times daily., Disp: 180 tablet, Rfl: 11 .  probenecid (BENEMID) 500 MG tablet, TK 1 T PO BID, Disp: , Rfl:  .  simvastatin (ZOCOR) 10 MG tablet, Take 10 mg by mouth every evening. , Disp: , Rfl:  .  spironolactone (ALDACTONE) 25 MG tablet, Take 0.5 tablets (12.5 mg total) by mouth every evening., Disp: 45 tablet, Rfl: 1 .  torsemide (DEMADEX) 20 MG tablet, Take 3 tablets (60 mg total) by mouth 2 (two) times daily., Disp: 180 tablet, Rfl: 3 .  carboxymethylcellulose (REFRESH PLUS) 0.5 % SOLN, Place 1 drop into both eyes 3 (three) times daily as needed (dry eyes)., Disp: , Rfl:  .  clindamycin (CLEOCIN) 300 MG capsule, Take 600 mg by mouth See admin instructions. Take 2 capsules (600 mg) by mouth 1 hour prior to dental procedure., Disp: , Rfl:  .  doxycycline (MONODOX) 100 MG capsule, TK 1 C PO BID WF FOR 10 DAYS, Disp: , Rfl:  .  gabapentin (NEURONTIN) 800 MG tablet, Take 800 mg by mouth at bedtime as needed (neuropathy). , Disp: , Rfl:  .  ipratropium (ATROVENT) 0.03 % nasal spray, Place 2 sprays into both nostrils 2 (two) times daily., Disp: , Rfl:  .  metolazone (ZAROXOLYN) 2.5 MG tablet, take 1 tablet by mouth if needed for FLUID OR EDEMA (Patient not taking: Reported on 10/05/2018), Disp: 30 tablet, Rfl: 2 .  omeprazole (PRILOSEC) 40 MG capsule, TK 1 C PO QD 30 MIN B BRE FOR 7 DAYS, Disp: , Rfl:  .  ondansetron  (ZOFRAN) 8 MG tablet, TK 1 T PO BID FOR 10 DAYS PRF NAUSEA, Disp: , Rfl:  .  polyethylene glycol (MIRALAX / GLYCOLAX) packet, Take 17 g by mouth daily as needed for moderate constipation., Disp: , Rfl:  .  polyethylene glycol powder (GLYCOLAX/MIRALAX) 17 GM/SCOOP powder, DISSOLVE 1 CAPFUL IN 8 OUNCES OF LIQUID AND TK PO ONCE A DAY UTD, Disp: , Rfl:  .  predniSONE (DELTASONE) 20 MG tablet, TK 2 TS PO QD FOR 5 DAYS, Disp: , Rfl:  .  zolpidem (AMBIEN) 10 MG tablet, Take 10 mg by mouth at bedtime as needed for sleep. For sleep , Disp: , Rfl:  Allergies  Allergen Reactions  . Ceftin Anaphylaxis    Face and throat swell   . Shellfish Allergy Other (See Comments)    Gout exacerbation  . Allopurinol Nausea Only and Other (See Comments)    weakness  . Ativan [Lorazepam] Itching  . Sulfa Antibiotics Itching  . Ultram [Tramadol Hcl] Itching     Social History   Socioeconomic History  . Marital status: Married    Spouse name: Not on file  . Number of children: 2  . Years of education: masters  . Highest education level: Not on file  Occupational History  . Not on file  Social Needs  . Financial resource strain: Not on file  . Food insecurity:    Worry: Not on file    Inability: Not on file  . Transportation needs:    Medical: Not on file    Non-medical: Not on file  Tobacco Use  . Smoking status: Former Smoker    Packs/day: 0.10    Years: 26.00    Pack years: 2.60    Types: Cigarettes    Last attempt to quit: 05/06/2007    Years since quitting: 11.4  . Smokeless tobacco: Never Used  Substance and Sexual Activity  . Alcohol use: No  . Drug use: No  . Sexual activity: Yes  Lifestyle  . Physical activity:    Days per week: Not on file    Minutes per session: Not on file  . Stress: Not on file  Relationships  . Social connections:    Talks on phone: Not on file    Gets together: Not on file    Attends religious service: Not on file    Active member of club or organization:  Not on file    Attends meetings of clubs or organizations: Not on file    Relationship status: Not on file  . Intimate partner violence:    Fear of current or ex partner: Not on  file    Emotionally abused: Not on file    Physically abused: Not on file    Forced sexual activity: Not on file  Other Topics Concern  . Not on file  Social History Narrative   Tobacco Use Cigarettes: Former Smoker, Quit in 2008   No Alcohol   No recreational drug use   Diet: Regular/Low Carb   Exercise: None   Occupation: disabled   Education: Research officer, political party, masters   Children: 2   Firearms: No   Therapist, art Use: Always   Former Metallurgist.        Physical Exam Pulmonary:     Effort: Pulmonary effort is normal. No respiratory distress.     Breath sounds: No wheezing or rales.  Musculoskeletal:        General: Swelling present.     Right lower leg: Edema present.     Left lower leg: Edema present.  Skin:    General: Skin is warm and dry.         Future Appointments  Date Time Provider [place]  10/25/2018  8:45 AM [facility] DEVICE REMOTES [facility]  10/27/2018  1:45 PM [facility] LAB [facility] None  12/07/2018  7:50 AM Narda Amber K, DO [facility] None  12/08/2018 10:15 AM Philemon Kingdom, MD [facility] None  01/11/2019  1:30 PM Marzetta Board, DPM [facility]     BP 122/78 (BP Location: Right Arm, Patient Position: Sitting, Cuff Size: Normal)   Pulse 88   Resp 15   Wt 179 lb 3.2 oz (81.3 kg)   SpO2 93%   BMI 35.00 kg/m   Weight yesterday-she didn't write the results down and she forgot Last visit weight-173  ATF pt CAO to her norm walking in the kitchen.  Her daughter is home with pt today.  Pt hasn't taken any of her medications today, she stated that she wanted to wait until I got there.  Pt's legs are still swollen from her feet to her thighs.  Her feet are and legs are red and warm to touch.  Pt's husband stated that pt isn't elevating her feet  throughout the day.  Pt said that she fell three times yesterday.  One time hitting her nose/ scratches noted to the bridge of her nose.  She denies LOC, she doesn't have any complaints of back or neck pain.  Pt stated that she "gets fixated on the counter tops and when she looks away she becomes off balance".  Missed meds from yesterday and last night.  I gave her the dose for this afternoon which included an extra 20 mg Torsemide and additional pottasium.  I should her husband the set up of her pill box and he verablized he understood.  She deneis sob, chest pain and dizziness. rx bottles verified and pill box refilled.   Pt was advised to take 80 mg Torsemide BID and additional 20 meq of Potassium for 2 days.  She has a lab appointment already set for next week.  Her forgetfulness is increasing per my observation. She saw the psychologist yesterday, whom prescribed her Mirtazapine 30 mg nightly (Dr. Casimiro Needle) on 5/26; included in her nightly meds for this week.    Medication ordered: midodrine citalopram  Valisha Heslin, EMT Paramedic [phone] 10/19/2018    ACTION: Home visit completed

## 2018-10-25 ENCOUNTER — Ambulatory Visit (INDEPENDENT_AMBULATORY_CARE_PROVIDER_SITE_OTHER): Payer: Medicare HMO

## 2018-10-25 DIAGNOSIS — Z95810 Presence of automatic (implantable) cardiac defibrillator: Secondary | ICD-10-CM

## 2018-10-25 DIAGNOSIS — I5022 Chronic systolic (congestive) heart failure: Secondary | ICD-10-CM

## 2018-10-25 NOTE — Progress Notes (Signed)
EPIC Encounter for ICM Monitoring  Patient Name: Rhonda [name] is a 68 y.o. female Date: 10/25/2018 Primary Care Physican: Maurice Small, MD Primary Cardiologist:Bensimhon Electrophysiologist: Santina Evans Pacing: 100% LastWeight:178lbs  10/17/2018: Weight 178 lbs 10/25/2018 Weight: 173 lbs   Spoke with patient and she reports legs have been very swollen, shiny and red for last 4 days with minimal improvement after taking extra Torsemide.   Patient does not adhere to low salt diet and eating restaurant foods such as noodles, hot dogs, hamburgers and pizza.   Thoracic impedanceremains abnormalsuggesting fluid accumulation even after being instructed by Darrick Grinder, NP to take80 BID x 2 days (5/27 and 5/28) but she reports she took EXTRA 4 DAYS   Prescribed:Torsemide 20 mg3tablets (60 mg total)by mouth daily.Potassium 20 meq 3tablets (60 mEq total)twice a day. Metolazone 2.5mg  1 tablet by mouth if needed for fluid or edema.  Labs: 07/26/2018 Creatinine1.90Nicky Pugh, Potassium3.5, Sodium141, [id number] 07/18/2018 Creatinine1.43, BUN21, Potassium3.7, [id number], LNL89-21  06/30/2018 Creatinine1.75, BUN27, Potassium3.0, [id number], [id number] 03/30/2018 Creatinine 1.63, BUN 26, Potassium 3.9, Sodium 139, GFR 32-37 02/10/2018 Creatinine 1.85, BUN 41, Potassium 3.9, Sodium 138, GFR 28-33 10/29/2017 Creatinine 1.45, BUN 24, Potassium 3.9, Sodium 141, GFR 37-43 10/21/2017 Creatinine 1.57, BUN 19, Potassium 3.8, Sodium 140, GFR 34-39 09/23/2017 Creatinine 1.26, BUN 29, Potassium 3.2, Sodium 142, GFR 49-56  Recommendations:  Advised to limit salt intake and avoid restaurant foods.  Advised will call back with any recommendations.   Follow-up plan: ICM clinic phone appointment on 11/01/2018 (manual send) to recheckfluid levels.   Copy of ICM check sent to Dr.Taylor, Dr Haroldine Laws and Darrick Grinder, NP for review and recommendation if needed.  3 month ICM trend:  10/25/2018    1 Year ICM trend:       [name], RN 10/25/2018 2:16 PM

## 2018-10-27 ENCOUNTER — Ambulatory Visit (HOSPITAL_COMMUNITY)
Admission: RE | Admit: 2018-10-27 | Discharge: 2018-10-27 | Disposition: A | Discharge status: Home / Self Care | Payer: Medicare HMO | Source: Ambulatory Visit | Attending: Cardiology | Admitting: Cardiology

## 2018-10-27 ENCOUNTER — Other Ambulatory Visit: Payer: Self-pay

## 2018-10-27 ENCOUNTER — Encounter (HOSPITAL_COMMUNITY): Payer: Self-pay

## 2018-10-27 VITALS — BP 136/86 | Wt 183.0 lb

## 2018-10-27 DIAGNOSIS — E1140 Type 2 diabetes mellitus with diabetic neuropathy, unspecified: Secondary | ICD-10-CM | POA: Diagnosis not present

## 2018-10-27 DIAGNOSIS — K219 Gastro-esophageal reflux disease without esophagitis: Secondary | ICD-10-CM | POA: Insufficient documentation

## 2018-10-27 DIAGNOSIS — E1122 Type 2 diabetes mellitus with diabetic chronic kidney disease: Secondary | ICD-10-CM | POA: Insufficient documentation

## 2018-10-27 DIAGNOSIS — R011 Cardiac murmur, unspecified: Secondary | ICD-10-CM | POA: Diagnosis not present

## 2018-10-27 DIAGNOSIS — I130 Hypertensive heart and chronic kidney disease with heart failure and stage 1 through stage 4 chronic kidney disease, or unspecified chronic kidney disease: Secondary | ICD-10-CM | POA: Insufficient documentation

## 2018-10-27 DIAGNOSIS — Z79899 Other long term (current) drug therapy: Secondary | ICD-10-CM | POA: Diagnosis not present

## 2018-10-27 DIAGNOSIS — I951 Orthostatic hypotension: Secondary | ICD-10-CM | POA: Insufficient documentation

## 2018-10-27 DIAGNOSIS — M109 Gout, unspecified: Secondary | ICD-10-CM | POA: Diagnosis not present

## 2018-10-27 DIAGNOSIS — F419 Anxiety disorder, unspecified: Secondary | ICD-10-CM | POA: Insufficient documentation

## 2018-10-27 DIAGNOSIS — E785 Hyperlipidemia, unspecified: Secondary | ICD-10-CM | POA: Diagnosis not present

## 2018-10-27 DIAGNOSIS — I5022 Chronic systolic (congestive) heart failure: Secondary | ICD-10-CM | POA: Diagnosis not present

## 2018-10-27 DIAGNOSIS — E039 Hypothyroidism, unspecified: Secondary | ICD-10-CM | POA: Insufficient documentation

## 2018-10-27 DIAGNOSIS — J45909 Unspecified asthma, uncomplicated: Secondary | ICD-10-CM | POA: Diagnosis not present

## 2018-10-27 DIAGNOSIS — F329 Major depressive disorder, single episode, unspecified: Secondary | ICD-10-CM | POA: Diagnosis not present

## 2018-10-27 DIAGNOSIS — Z6835 Body mass index (BMI) 35.0-35.9, adult: Secondary | ICD-10-CM | POA: Insufficient documentation

## 2018-10-27 DIAGNOSIS — N183 Chronic kidney disease, stage 3 unspecified: Secondary | ICD-10-CM

## 2018-10-27 DIAGNOSIS — I428 Other cardiomyopathies: Secondary | ICD-10-CM | POA: Insufficient documentation

## 2018-10-27 DIAGNOSIS — M549 Dorsalgia, unspecified: Secondary | ICD-10-CM | POA: Diagnosis not present

## 2018-10-27 LAB — BASIC METABOLIC PANEL
Anion gap: 9 (ref 5–15)
BUN: 15 mg/dL (ref 8–23)
CO2: 23 mmol/L (ref 22–32)
Calcium: 9.3 mg/dL (ref 8.9–10.3)
Chloride: 110 mmol/L (ref 98–111)
Creatinine, Ser: 1.44 mg/dL — ABNORMAL HIGH (ref 0.44–1.00)
GFR calc Af Amer: 44 mL/min — ABNORMAL LOW (ref >60)
GFR calc non Af Amer: 38 mL/min — ABNORMAL LOW (ref >60)
Glucose, Bld: 75 mg/dL (ref 70–99)
Potassium: 5.2 mmol/L — ABNORMAL HIGH (ref 3.5–5.1)
Sodium: 142 mmol/L (ref 135–145)

## 2018-10-27 LAB — CBC
HCT: 37.3 % (ref 36.0–46.0)
Hemoglobin: 11.8 g/dL — ABNORMAL LOW (ref 12.0–15.0)
MCH: 33.1 pg (ref 26.0–34.0)
MCHC: 31.6 g/dL (ref 30.0–36.0)
MCV: 104.8 fL — ABNORMAL HIGH (ref 80.0–100.0)
Platelets: 254 10*3/uL (ref 150–400)
RBC: 3.56 MIL/uL — ABNORMAL LOW (ref 3.87–5.11)
RDW: 14.1 % (ref 11.5–15.5)
WBC: 5.4 10*3/uL (ref 4.0–10.5)
nRBC: 0 % (ref 0.0–0.2)

## 2018-10-27 NOTE — Progress Notes (Signed)
Remote ICD transmission.   

## 2018-10-27 NOTE — Patient Instructions (Addendum)
Take Torsemide 60mg  twice daily.  Take Metolazone 2.5mg  for two days. ( take 30 min prior to Torsemide)  Follow up next week. Wednesday June 10th at 2pm **gate code- [phone]**  Order sent to [facility] for wound care/[facility].

## 2018-10-27 NOTE — Addendum Note (Signed)
Encounter addended by: Harvie Junior, CMA on: 10/27/2018 2:35 PM  Actions taken: Diagnosis association updated, Vitals modified, Allergies reviewed, Medication note saved, Reconcile Outside Information medication data discarded, Medication taking status modified, Order list changed, Medication List reviewed, Order Reconciliation Section accessed, Home Medications modified

## 2018-10-27 NOTE — Progress Notes (Addendum)
ADVANCED HF CLINIC NOTE  Patient ID: Rhonda [name], female   DOB: December 27, 1950, 68 y.o.   MRN: [id number]  Primary Cardiologist: Dr Radford Pax  General Surgeon: Dr Donne Hazel  Orthopedic: Dr Alvan Dame  PCP: Dr Justin Mend Referring MD: Dr Radford Pax  HPI: Rhonda [name] is a 68 y.o. 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 She has had sleep study with no  evidence of sleep apnea.  She has a Medtronic CRT-D device.  Last echo in 7/18 showed recovery of EF to 55-60%.   Today she present for an acute visit due to increased leg edema. On 5/26 she was instructed to increased torsemide to 60 mg twice a day for 2 days then go back to 60 mg daily. Overall feeling fine. Denies SOB/PND/Orthopnea. Appetite ok. She has been eating take out soup everyday. No fever or chills. Weight at home  pounds. Taking all medications but there is some question about if she increased her torsemide.    Optivol:  WEll above threshold.   10/14/12 ECHO EF 35% RV ok  04/12/13 ECHO EF 20-25%, LV moderately dilated 10/11/2013 ECHO EF 45-50% 7/18 ECHO EF 55-60%, normal RV size and systolic function, PASP 34 mmHg  11/24/12 Creatinine 1.23 Potassium 4.1 12/15/12 Creatinine 2.03 Potassium 3.3  12/22/12 Creatinine 1.2 Potassium 3.8 02/23/13 Creatinine 1.68 K 4.4  05/02/13 K 4.2 Creatinine 1.8  10/11/13 K 3.7 Creatinine 1.69 10/17 K 4.1, creatinine 1.35 09/2017: K3.8 Creatinine 1.57  Review of systems complete and found to be negative unless listed in HPI.   Past Medical History:  Diagnosis Date  . Anemia   . Anxiety   . Arthritis    RIGHT KNEE ARTHRITIS AND PAIN-PT TOLD BONE ON BONE  . Asthma   . Back pain    DISK  PROBLEM  . Breast cancer, stage 1 ([place]) 03/26/2011   left-FINISHED CHEMO  AND RADIATION  . Cardiomyopathy    [name]  . Chronic systolic heart failure (HCC)    a) NICM b) ECHO (03/2013) EF 20-25% c) ECHO (09/2013) EF 45-50%, grade I DD  . Complication of anesthesia    PT STATES HER B/P LOW AFTER ONE OF HER SURGERIES--SHE ATTRIBUTES TO LYING FLAT  . Depression   . Diabetes mellitus    "diet controlled" (05/04/2103)  . Exertional shortness of breath   . GERD (gastroesophageal reflux disease)   . Gout   . Heart murmur   . Hyperlipidemia   . Hypertension   . Hypothyroidism   . Left bundle branch block    s/p CRT-D (04/2013)  . Migraines   . On home oxygen therapy    "2L suppose to be q night" (05/03/2013)  . Peripheral neuropathy    feet  . SVD (spontaneous vaginal delivery)    x 2  . Unspecified vitamin D deficiency 03/26/2011   does not take meds    Current Outpatient Medications  Medication Sig Dispense Refill  . acetaZOLAMIDE (DIAMOX) 125 MG tablet TAKE 1 TABLET(125 MG) BY MOUTH DAILY 30 tablet 5  . buPROPion (WELLBUTRIN XL) 150 MG 24 hr tablet Take 450 mg by mouth every morning.     . carboxymethylcellulose (REFRESH PLUS)  0.5 % SOLN Place 1 drop into both eyes 3 (three) times daily as needed (dry eyes).    . carvedilol (COREG) 3.125 MG tablet Take 3.125 mg by mouth 2 (two) times daily with a meal.    . citalopram (CELEXA) 20 MG tablet Take 20 mg by mouth daily.    . clindamycin (CLEOCIN) 300 MG capsule Take 600 mg by mouth See admin instructions. Take 2 capsules (600 mg) by mouth 1 hour prior to dental procedure.    . clonazePAM (KLONOPIN) 0.5 MG tablet Take 0.5 mg by mouth 3 (three) times daily as needed for anxiety.     . colestipol (COLESTID) 1 g tablet Take by mouth 2 (two) times daily.     . fexofenadine (ALLEGRA) 180 MG tablet Take 180 mg by mouth 2 (two) times daily.    Marland Kitchen gabapentin (NEURONTIN) 300 MG capsule Take 300 mg by mouth 3 (three)  times daily.     Marland Kitchen gabapentin (NEURONTIN) 800 MG tablet Take 800 mg by mouth at bedtime as needed (neuropathy).     Marland Kitchen ipratropium (ATROVENT) 0.03 % nasal spray Place 2 sprays into both nostrils 2 (two) times daily.    Marland Kitchen levothyroxine (SYNTHROID, LEVOTHROID) 100 MCG tablet TAKE 1 TABLET(100 MCG) BY MOUTH DAILY 45 tablet 3  . metolazone (ZAROXOLYN) 2.5 MG tablet take 1 tablet by mouth if needed for FLUID OR EDEMA 30 tablet 2  . midodrine (PROAMATINE) 10 MG tablet Take 1 tablet (10 mg total) by mouth 3 (three) times daily with meals. 90 tablet 3  . montelukast (SINGULAIR) 10 MG tablet Take 10 mg by mouth at bedtime.   1  . omeprazole (PRILOSEC) 40 MG capsule TK 1 C PO QD 30 MIN B BRE FOR 7 DAYS    . ondansetron (ZOFRAN) 8 MG tablet TK 1 T PO BID FOR 10 DAYS PRF NAUSEA    . polyethylene glycol (MIRALAX / GLYCOLAX) packet Take 17 g by mouth daily as needed for moderate constipation.    . polyethylene glycol powder (GLYCOLAX/MIRALAX) 17 GM/SCOOP powder DISSOLVE 1 CAPFUL IN 8 OUNCES OF LIQUID AND TK PO ONCE A DAY UTD    . potassium chloride SA (K-DUR,KLOR-CON) 20 MEQ tablet Take 3 tablets (60 mEq total) by mouth 2 (two) times daily. 180 tablet 11  . probenecid (BENEMID) 500 MG tablet TK 1 T PO BID    . simvastatin (ZOCOR) 10 MG tablet Take 10 mg by mouth every evening.     Marland Kitchen spironolactone (ALDACTONE) 25 MG tablet Take 0.5 tablets (12.5 mg total) by mouth every evening. 45 tablet 1  . torsemide (DEMADEX) 20 MG tablet Take 60 mg by mouth daily.    Marland Kitchen zolpidem (AMBIEN) 10 MG tablet Take 10 mg by mouth at bedtime as needed for sleep. For sleep      No current facility-administered medications for this encounter.     Vitals:   10/27/18 1431  BP: 136/86  Weight: 83 kg (183 lb)     Wt Readings from Last 3 Encounters:  10/27/18 83 kg (183 lb)  10/19/18 81.3 kg (179 lb 3.2 oz)  10/12/18 78.5 kg (173 lb)    PHYSICAL EXAM: General:  Well appearing. No resp difficulty HEENT: normal Neck: supple. JVP  10-11. Carotids 2+ bilat; no bruits. No lymphadenopathy or thryomegaly appreciated. Cor: PMI nondisplaced. Regular rate & rhythm. No rubs, gallops or murmurs. Lungs: clear Abdomen: soft, nontender, nondistended. No hepatosplenomegaly. No bruits or masses. Good bowel sounds. Extremities: no cyanosis, clubbing,  rash, R and LLE 2+ small open wound on R and LLE with serous exudate. Neuro: alert & orientedx3, cranial nerves grossly intact. moves all 4 extremities w/o difficulty. Affect pleasant  ASSESSMENT & PLAN:  1) Chronic systolic HF: NICM s/p Medtronic CRT-D, ?Viral myocarditis. Cardiomyopathy dates from 45. EF 45-50% (09/2013), EF up to 55-60% on 20202.  PYP 09/02/17 negative for TTR (Grade 0-1, H/CCL 1.2). SPEP with no M-spike.  -NYHA III. Optivol discussed- Impance down. Volume status elevated likely in the setting of high sodium diet.  - Instructed to continue torsemide 60 mg twice a day and take 2.5 mg metolazone for 2 days. Lenghty discussion about low salt food choices and to limit fluid to < 2 liters per day.  -Continue low dose carvedilol.  - Continue spiro 12.5 mg daily.  -CHeck BMET today.  -  Continue HF paramedicine.  2) Obesity -Body mass index is 35.74 kg/m. Discussed portion control  3) HTN - Stable.  4) Orthostatic Hypotension.  - Neurology following. Gabapentin decreased. She is also on diamox for increased ICP - Continue midodrine to 10 mg TID.  5) Chronic Venous stasis -Refer back to [facility] for unna boots.  6) Falls  No recent falls.  7) CKD 3 - Creatinine baseline 1.4-1.6 BMET today.   Follow up next week to reassess volume status.  Morse Brueggemann NP-C  2:50 PM

## 2018-10-27 NOTE — Addendum Note (Signed)
Encounter addended by: Conrad Foley, NP on: 10/27/2018 3:17 PM  Actions taken: Clinical Note Signed

## 2018-10-27 NOTE — Addendum Note (Signed)
Encounter addended by: Harvie Junior, CMA on: 10/27/2018 3:22 PM  Actions taken: Clinical Note Signed

## 2018-10-27 NOTE — Addendum Note (Signed)
Encounter addended by: Harvie Junior, CMA on: 10/27/2018 3:34 PM  Actions taken: Order list changed, Diagnosis association updated

## 2018-10-28 ENCOUNTER — Other Ambulatory Visit (HOSPITAL_COMMUNITY): Payer: Self-pay

## 2018-10-28 DIAGNOSIS — E1142 Type 2 diabetes mellitus with diabetic polyneuropathy: Secondary | ICD-10-CM | POA: Diagnosis not present

## 2018-10-28 DIAGNOSIS — I130 Hypertensive heart and chronic kidney disease with heart failure and stage 1 through stage 4 chronic kidney disease, or unspecified chronic kidney disease: Secondary | ICD-10-CM | POA: Diagnosis not present

## 2018-10-28 DIAGNOSIS — I878 Other specified disorders of veins: Secondary | ICD-10-CM | POA: Diagnosis not present

## 2018-10-28 DIAGNOSIS — I5022 Chronic systolic (congestive) heart failure: Secondary | ICD-10-CM | POA: Diagnosis not present

## 2018-10-28 DIAGNOSIS — E1122 Type 2 diabetes mellitus with diabetic chronic kidney disease: Secondary | ICD-10-CM | POA: Diagnosis not present

## 2018-10-28 DIAGNOSIS — Z6825 Body mass index (BMI) 25.0-25.9, adult: Secondary | ICD-10-CM | POA: Diagnosis not present

## 2018-10-28 DIAGNOSIS — N183 Chronic kidney disease, stage 3 (moderate): Secondary | ICD-10-CM | POA: Diagnosis not present

## 2018-10-28 DIAGNOSIS — I428 Other cardiomyopathies: Secondary | ICD-10-CM | POA: Diagnosis not present

## 2018-10-28 DIAGNOSIS — I951 Orthostatic hypotension: Secondary | ICD-10-CM | POA: Diagnosis not present

## 2018-10-28 NOTE — Progress Notes (Signed)
Paramedicine Encounter    Patient ID: Rhonda [name], female    DOB: April 01, 1951, 68 y.o.   MRN: [id number]    Patient Care Team: Maurice Small, MD as PCP - General (Family Medicine) [name], LCSW as Social Worker (Licensed Clinical Social Worker)  Patient Active Problem List   Diagnosis Date Noted  . Pain, dental 08/31/2018  . Orthostatic hypotension 07/28/2017  . SVD (spontaneous vaginal delivery)   . Peripheral neuropathy   . On home oxygen therapy   . Migraines   . Left bundle branch block   . Hypothyroidism   . Hypertension   . Hyperlipidemia   . Heart murmur   . GERD (gastroesophageal reflux disease)   . Exertional shortness of breath   . Depression   . Complication of anesthesia   . Back pain   . Arthritis   . Anxiety   . Anemia   . HTN (hypertension) 09/14/2013  . Chronic respiratory failure ([place]) 09/14/2013  . 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 Morbid obesity ([place]) 11/01/2012  . Chronic systolic heart failure ([place]) 10/27/2012  . S/P right TKA 05/10/2012  . Endometrial polyp 01/20/2012  . Breast cancer, stage 1 ([place]) 03/26/2011  . Unspecified vitamin D deficiency 03/26/2011    Current Outpatient Medications:  .  acetaZOLAMIDE (DIAMOX) 125 MG tablet, TAKE 1 TABLET(125 MG) BY MOUTH DAILY, Disp: 30 tablet, Rfl: 5 .  buPROPion (WELLBUTRIN XL) 150 MG 24 hr tablet, Take 450 mg by mouth every morning. , Disp: , Rfl:  .  carvedilol (COREG) 3.125 MG tablet, Take 3.125 mg by mouth 2 (two) times daily with a meal., Disp: , Rfl:  .  clonazePAM (KLONOPIN) 0.5 MG tablet, Take 0.5 mg by mouth 3 (three) times daily as needed for anxiety. , Disp: , Rfl:  .  colestipol (COLESTID) 1 g tablet, Take by mouth 2 (two) times daily. , Disp: , Rfl:  .  fexofenadine (ALLEGRA) 180 MG tablet, Take 180 mg by mouth 2 (two) times daily., Disp: , Rfl:  .  gabapentin (NEURONTIN) 300 MG capsule, Take 300 mg by mouth 3 (three) times daily. , Disp:  , Rfl:  .  levothyroxine (SYNTHROID, LEVOTHROID) 100 MCG tablet, TAKE 1 TABLET(100 MCG) BY MOUTH DAILY, Disp: 45 tablet, Rfl: 3 .  metolazone (ZAROXOLYN) 2.5 MG tablet, take 1 tablet by mouth if needed for FLUID OR EDEMA, Disp: 30 tablet, Rfl: 2 .  midodrine (PROAMATINE) 10 MG tablet, Take 1 tablet (10 mg total) by mouth 3 (three) times daily with meals., Disp: 90 tablet, Rfl: 3 .  montelukast (SINGULAIR) 10 MG tablet, Take 10 mg by mouth at bedtime. , Disp: , Rfl: 1 .  probenecid (BENEMID) 500 MG tablet, TK 1 T PO BID, Disp: , Rfl:  .  simvastatin (ZOCOR) 10 MG tablet, Take 10 mg by mouth every evening. , Disp: , Rfl:  .  spironolactone (ALDACTONE) 25 MG tablet, Take 0.5 tablets (12.5 mg total) by mouth every evening., Disp: 45 tablet, Rfl: 1 .  torsemide (DEMADEX) 20 MG tablet, Take 60 mg by mouth daily., Disp: , Rfl:  .  carboxymethylcellulose (REFRESH PLUS) 0.5 % SOLN, Place 1 drop into both eyes 3 (three) times daily as needed (dry eyes)., Disp: , Rfl:  .  citalopram (CELEXA) 20 MG tablet, Take 20 mg by mouth daily., Disp: , Rfl:  .  clindamycin (CLEOCIN) 300 MG capsule, Take 600 mg by mouth See admin instructions. Take 2 capsules (600 mg) by  mouth 1 hour prior to dental procedure., Disp: , Rfl:  .  gabapentin (NEURONTIN) 800 MG tablet, Take 800 mg by mouth at bedtime as needed (neuropathy). , Disp: , Rfl:  .  ipratropium (ATROVENT) 0.03 % nasal spray, Place 2 sprays into both nostrils 2 (two) times daily., Disp: , Rfl:  .  omeprazole (PRILOSEC) 40 MG capsule, TK 1 C PO QD 30 MIN B BRE FOR 7 DAYS, Disp: , Rfl:  .  ondansetron (ZOFRAN) 8 MG tablet, TK 1 T PO BID FOR 10 DAYS PRF NAUSEA, Disp: , Rfl:  .  polyethylene glycol (MIRALAX / GLYCOLAX) packet, Take 17 g by mouth daily as needed for moderate constipation., Disp: , Rfl:  .  polyethylene glycol powder (GLYCOLAX/MIRALAX) 17 GM/SCOOP powder, DISSOLVE 1 CAPFUL IN 8 OUNCES OF LIQUID AND TK PO ONCE A DAY UTD, Disp: , Rfl:  .  potassium chloride  SA (K-DUR,KLOR-CON) 20 MEQ tablet, Take 3 tablets (60 mEq total) by mouth 2 (two) times daily., Disp: 180 tablet, Rfl: 11 .  zolpidem (AMBIEN) 10 MG tablet, Take 10 mg by mouth at bedtime as needed for sleep. For sleep , Disp: , Rfl:  Allergies  Allergen Reactions  . Ceftin Anaphylaxis    Face and throat swell   . Shellfish Allergy Other (See Comments)    Gout exacerbation  . Allopurinol Nausea Only and Other (See Comments)    weakness  . Ativan [Lorazepam] Itching  . Sulfa Antibiotics Itching  . Ultram [Tramadol Hcl] Itching     Social History   Socioeconomic History  . Marital status: Married    Spouse name: Not on file  . Number of children: 2  . Years of education: masters  . Highest education level: Not on file  Occupational History  . Not on file  Social Needs  . Financial resource strain: Not on file  . Food insecurity:    Worry: Not on file    Inability: Not on file  . Transportation needs:    Medical: Not on file    Non-medical: Not on file  Tobacco Use  . Smoking status: Former Smoker    Packs/day: 0.10    Years: 26.00    Pack years: 2.60    Types: Cigarettes    Last attempt to quit: 05/06/2007    Years since quitting: 11.4  . Smokeless tobacco: Never Used  Substance and Sexual Activity  . Alcohol use: No  . Drug use: No  . Sexual activity: Yes  Lifestyle  . Physical activity:    Days per week: Not on file    Minutes per session: Not on file  . Stress: Not on file  Relationships  . Social connections:    Talks on phone: Not on file    Gets together: Not on file    Attends religious service: Not on file    Active member of club or organization: Not on file    Attends meetings of clubs or organizations: Not on file    Relationship status: Not on file  . Intimate partner violence:    Fear of current or ex partner: Not on file    Emotionally abused: Not on file    Physically abused: Not on file    Forced sexual activity: Not on file  Other Topics  Concern  . Not on file  Social History Narrative   Tobacco Use Cigarettes: Former Smoker, Quit in 2008   No Alcohol   No recreational drug use  Diet: Regular/Low Carb   Exercise: None   Occupation: disabled   Education: Research officer, political party, masters   Children: 2   Firearms: No   Therapist, art Use: Always   Former Metallurgist.        Physical Exam Pulmonary:     Effort: No respiratory distress.     Breath sounds: No wheezing or rales.  Musculoskeletal:     Right lower leg: Edema present.     Left lower leg: Edema present.  Skin:    General: Skin is warm and dry.         Future Appointments  Date Time Provider [place]  11/01/2018  9:10 AM [facility] DEVICE REMOTES [facility]  11/02/2018  2:00 PM [facility] PA/NP [facility] None  12/07/2018  7:50 AM Narda Amber K, DO [facility] None  12/08/2018 10:15 AM Philemon Kingdom, MD [facility] None  01/11/2019  1:30 PM Marzetta Board, DPM [facility]  01/18/2019  7:25 AM [facility] DEVICE REMOTES [facility]     BP 106/78 (BP Location: Right Arm, Patient Position: Sitting, Cuff Size: Large)   Pulse 72   Temp (!) 97.5 F (36.4 C)   SpO2 98%   Weight yesterday-179 Last visit weight-183  ATF pt CAO x4 standing in the kitchen c/o leg pain.  Pt was seen at [facility] yesterday. She was advised to take a metolazone for two day and torsemide 60mg  BID; discontinue potassium.  I refilled her pill box accordingly.  The metolazone wasn't ready when her husband went to the pharmacy, I told him when to give her the med. She denies sob, chest pain and dizziness.  Pt stated that she's waiting for someone from [facility] to call to schedule a visit.  She denies sob, chest pain and dizziness. rx bottles verified and pill box refilled.    Take metolazone 2 days D/c potassium Torsemide 60 mg BID     Medication ordered:  midodrjine filled until Sunday  morning Fexofenadine clonzapam colestipol   Karriem Muench, EMT Paramedic [phone] 10/28/2018    ACTION: Home visit completed

## 2018-10-28 NOTE — Addendum Note (Signed)
Encounter addended by: Conrad Naranjito, NP on: 10/28/2018 8:17 AM  Actions taken: Follow-up modified, LOS modified, Clinical Note Signed, Visit diagnoses modified

## 2018-11-01 ENCOUNTER — Telehealth: Payer: Self-pay

## 2018-11-01 NOTE — Telephone Encounter (Signed)
Left message for patient to remind of missed remote transmission.  

## 2018-11-02 ENCOUNTER — Other Ambulatory Visit (HOSPITAL_COMMUNITY): Payer: Self-pay

## 2018-11-02 ENCOUNTER — Encounter (HOSPITAL_COMMUNITY): Payer: Self-pay

## 2018-11-02 ENCOUNTER — Other Ambulatory Visit (HOSPITAL_COMMUNITY): Payer: Self-pay | Admitting: *Deleted

## 2018-11-02 ENCOUNTER — Other Ambulatory Visit: Payer: Self-pay

## 2018-11-02 ENCOUNTER — Ambulatory Visit (HOSPITAL_COMMUNITY)
Admission: RE | Admit: 2018-11-02 | Discharge: 2018-11-02 | Disposition: A | Discharge status: Home / Self Care | Payer: Medicare HMO | Source: Ambulatory Visit | Attending: Internal Medicine | Admitting: Internal Medicine

## 2018-11-02 MED ORDER — METOLAZONE 2.5 MG PO TABS: ORAL_TABLET | ORAL | 2 refills | Status: DC

## 2018-11-02 MED ORDER — METOLAZONE 2.5 MG PO TABS: ORAL_TABLET | ORAL | 0 refills | Status: DC

## 2018-11-02 NOTE — Progress Notes (Signed)
Paramedicine Encounter    Patient ID: Rhonda [name], female    DOB: Dec 07, 1950, 68 y.o.   MRN: [id number]    Patient Care Team: Maurice Small, MD as PCP - General (Family Medicine) [name], LCSW as Social Worker (Licensed Clinical Social Worker)  Patient Active Problem List   Diagnosis Date Noted  . Pain, dental 08/31/2018  . Orthostatic hypotension 07/28/2017  . SVD (spontaneous vaginal delivery)   . Peripheral neuropathy   . On home oxygen therapy   . Migraines   . Left bundle branch block   . Hypothyroidism   . Hypertension   . Hyperlipidemia   . Heart murmur   . GERD (gastroesophageal reflux disease)   . Exertional shortness of breath   . Depression   . Complication of anesthesia   . Back pain   . Arthritis   . Anxiety   . Anemia   . HTN (hypertension) 09/14/2013  . Chronic respiratory failure ([place]) 09/14/2013  . 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 Morbid obesity ([place]) 11/01/2012  . Chronic systolic heart failure ([place]) 10/27/2012  . S/P right TKA 05/10/2012  . Endometrial polyp 01/20/2012  . Breast cancer, stage 1 ([place]) 03/26/2011  . Unspecified vitamin D deficiency 03/26/2011    Current Outpatient Medications:  .  acetaZOLAMIDE (DIAMOX) 125 MG tablet, TAKE 1 TABLET(125 MG) BY MOUTH DAILY, Disp: 30 tablet, Rfl: 5 .  buPROPion (WELLBUTRIN XL) 150 MG 24 hr tablet, Take 450 mg by mouth every morning. , Disp: , Rfl:  .  carvedilol (COREG) 3.125 MG tablet, Take 3.125 mg by mouth 2 (two) times daily with a meal., Disp: , Rfl:  .  clonazePAM (KLONOPIN) 0.5 MG tablet, Take 0.5 mg by mouth 3 (three) times daily as needed for anxiety. , Disp: , Rfl:  .  fexofenadine (ALLEGRA) 180 MG tablet, Take 180 mg by mouth 2 (two) times daily., Disp: , Rfl:  .  gabapentin (NEURONTIN) 300 MG capsule, Take 300 mg by mouth 3 (three) times daily. , Disp: , Rfl:  .  metolazone (ZAROXOLYN) 2.5 MG tablet, Take only as directed by CHF clinic,  Disp: 10 tablet, Rfl: 0 .  midodrine (PROAMATINE) 10 MG tablet, Take 1 tablet (10 mg total) by mouth 3 (three) times daily with meals., Disp: 90 tablet, Rfl: 3 .  montelukast (SINGULAIR) 10 MG tablet, Take 10 mg by mouth at bedtime. , Disp: , Rfl: 1 .  carboxymethylcellulose (REFRESH PLUS) 0.5 % SOLN, Place 1 drop into both eyes 3 (three) times daily as needed (dry eyes)., Disp: , Rfl:  .  citalopram (CELEXA) 20 MG tablet, Take 20 mg by mouth daily., Disp: , Rfl:  .  clindamycin (CLEOCIN) 300 MG capsule, Take 600 mg by mouth See admin instructions. Take 2 capsules (600 mg) by mouth 1 hour prior to dental procedure., Disp: , Rfl:  .  colestipol (COLESTID) 1 g tablet, Take by mouth 2 (two) times daily. , Disp: , Rfl:  .  gabapentin (NEURONTIN) 800 MG tablet, Take 800 mg by mouth at bedtime as needed (neuropathy). , Disp: , Rfl:  .  ipratropium (ATROVENT) 0.03 % nasal spray, Place 2 sprays into both nostrils 2 (two) times daily., Disp: , Rfl:  .  levothyroxine (SYNTHROID, LEVOTHROID) 100 MCG tablet, TAKE 1 TABLET(100 MCG) BY MOUTH DAILY, Disp: 45 tablet, Rfl: 3 .  omeprazole (PRILOSEC) 40 MG capsule, TK 1 C PO QD 30 MIN B BRE FOR 7 DAYS, Disp: , Rfl:  .  ondansetron (ZOFRAN) 8 MG tablet, TK 1 T PO BID FOR 10 DAYS PRF NAUSEA, Disp: , Rfl:  .  polyethylene glycol (MIRALAX / GLYCOLAX) packet, Take 17 g by mouth daily as needed for moderate constipation., Disp: , Rfl:  .  polyethylene glycol powder (GLYCOLAX/MIRALAX) 17 GM/SCOOP powder, DISSOLVE 1 CAPFUL IN 8 OUNCES OF LIQUID AND TK PO ONCE A DAY UTD, Disp: , Rfl:  .  potassium chloride SA (K-DUR,KLOR-CON) 20 MEQ tablet, Take 3 tablets (60 mEq total) by mouth 2 (two) times daily. (Patient not taking: Reported on 11/02/2018), Disp: 180 tablet, Rfl: 11 .  probenecid (BENEMID) 500 MG tablet, TK 1 T PO BID, Disp: , Rfl:  .  simvastatin (ZOCOR) 10 MG tablet, Take 10 mg by mouth every evening. , Disp: , Rfl:  .  spironolactone (ALDACTONE) 25 MG tablet, Take 0.5  tablets (12.5 mg total) by mouth every evening., Disp: 45 tablet, Rfl: 1 .  torsemide (DEMADEX) 20 MG tablet, Take 60 mg by mouth daily., Disp: , Rfl:  .  zolpidem (AMBIEN) 10 MG tablet, Take 10 mg by mouth at bedtime as needed for sleep. For sleep , Disp: , Rfl:  Allergies  Allergen Reactions  . Ceftin Anaphylaxis    Face and throat swell   . Shellfish Allergy Other (See Comments)    Gout exacerbation  . Allopurinol Nausea Only and Other (See Comments)    weakness  . Ativan [Lorazepam] Itching  . Sulfa Antibiotics Itching  . Ultram [Tramadol Hcl] Itching     Social History   Socioeconomic History  . Marital status: Married    Spouse name: Not on file  . Number of children: 2  . Years of education: masters  . Highest education level: Not on file  Occupational History  . Not on file  Social Needs  . Financial resource strain: Not on file  . Food insecurity:    Worry: Not on file    Inability: Not on file  . Transportation needs:    Medical: Not on file    Non-medical: Not on file  Tobacco Use  . Smoking status: Former Smoker    Packs/day: 0.10    Years: 26.00    Pack years: 2.60    Types: Cigarettes    Last attempt to quit: 05/06/2007    Years since quitting: 11.5  . Smokeless tobacco: Never Used  Substance and Sexual Activity  . Alcohol use: No  . Drug use: No  . Sexual activity: Yes  Lifestyle  . Physical activity:    Days per week: Not on file    Minutes per session: Not on file  . Stress: Not on file  Relationships  . Social connections:    Talks on phone: Not on file    Gets together: Not on file    Attends religious service: Not on file    Active member of club or organization: Not on file    Attends meetings of clubs or organizations: Not on file    Relationship status: Not on file  . Intimate partner violence:    Fear of current or ex partner: Not on file    Emotionally abused: Not on file    Physically abused: Not on file    Forced sexual  activity: Not on file  Other Topics Concern  . Not on file  Social History Narrative   Tobacco Use Cigarettes: Former Smoker, Quit in 2008   No Alcohol   No recreational drug use  Diet: Regular/Low Carb   Exercise: None   Occupation: disabled   Education: Research officer, political party, masters   Children: 2   Firearms: No   Therapist, art Use: Always   Former Metallurgist.        Physical Exam Pulmonary:     Effort: Pulmonary effort is normal. No respiratory distress.     Breath sounds: No wheezing or rales.  Musculoskeletal:     Right lower leg: Edema present.     Left lower leg: Edema present.  Skin:    General: Skin is warm and dry.         Future Appointments  Date Time Provider [place]  11/10/2018 11:30 AM [facility] PA/NP [facility] None  12/07/2018  7:50 AM Narda Amber K, DO [facility] None  12/08/2018 10:15 AM Philemon Kingdom, MD [facility] None  01/11/2019  1:30 PM Marzetta Board, DPM [facility]  01/18/2019  7:25 AM [facility] DEVICE REMOTES [facility]     BP 124/60 (BP Location: Right Arm, Patient Position: Sitting, Cuff Size: Normal)   Pulse 86   Temp 99.2 F (37.3 C)   SpO2 98%    ATF pt CAO x4 standing in the kitchen.  Pt husband picked up the metolazone from the pharmacy, so I gave her one while I filled her pill box.  The other one is placed in her morning dose of meds with a "O" on the spot.  She doesn't have any pain right now, no sob or chest pain.  She has RN coming by weekly to wrap her legs.  We discussed her sodium intake and elevating her legs.  Pt is constantly walking around. rx bottles verified and pill box refilled.    Medication ordered: Fexofenadine Citalopram Clonazepam Colestipol  Brooks Stotz, EMT Paramedic [phone] 11/02/2018    ACTION: Home visit completed

## 2018-11-02 NOTE — Progress Notes (Signed)
Paramedicine Encounter   Patient ID: Rhonda [name] , female,   DOB: 03/10/1951,67 y.o.,  MRN: [id number]   Met patient in clinic today with provider Amy.  Pt is with her husband during this visit.  Amy was taking with [name] upon my arrival.  [name] didn't take the metolazone last week as directed.  The pharmacy didn't have the prescription available.  The visit is rescheduled for next week.  I will f/u with pt later today and refill her pill box and make sure that she takes the 1st dose today before I leave her.    Time spent with patient 20 mins    , EMT-Paramedic [phone] 11/02/2018   ACTION: Home visit completed     

## 2018-11-03 DIAGNOSIS — Z6825 Body mass index (BMI) 25.0-25.9, adult: Secondary | ICD-10-CM | POA: Diagnosis not present

## 2018-11-03 DIAGNOSIS — E1142 Type 2 diabetes mellitus with diabetic polyneuropathy: Secondary | ICD-10-CM | POA: Diagnosis not present

## 2018-11-03 DIAGNOSIS — E1122 Type 2 diabetes mellitus with diabetic chronic kidney disease: Secondary | ICD-10-CM | POA: Diagnosis not present

## 2018-11-03 DIAGNOSIS — I951 Orthostatic hypotension: Secondary | ICD-10-CM | POA: Diagnosis not present

## 2018-11-03 DIAGNOSIS — I428 Other cardiomyopathies: Secondary | ICD-10-CM | POA: Diagnosis not present

## 2018-11-03 DIAGNOSIS — N183 Chronic kidney disease, stage 3 (moderate): Secondary | ICD-10-CM | POA: Diagnosis not present

## 2018-11-03 DIAGNOSIS — I5022 Chronic systolic (congestive) heart failure: Secondary | ICD-10-CM | POA: Diagnosis not present

## 2018-11-03 DIAGNOSIS — I130 Hypertensive heart and chronic kidney disease with heart failure and stage 1 through stage 4 chronic kidney disease, or unspecified chronic kidney disease: Secondary | ICD-10-CM | POA: Diagnosis not present

## 2018-11-03 DIAGNOSIS — I878 Other specified disorders of veins: Secondary | ICD-10-CM | POA: Diagnosis not present

## 2018-11-05 DIAGNOSIS — I878 Other specified disorders of veins: Secondary | ICD-10-CM | POA: Diagnosis not present

## 2018-11-05 DIAGNOSIS — I5022 Chronic systolic (congestive) heart failure: Secondary | ICD-10-CM | POA: Diagnosis not present

## 2018-11-05 DIAGNOSIS — I951 Orthostatic hypotension: Secondary | ICD-10-CM | POA: Diagnosis not present

## 2018-11-05 DIAGNOSIS — N183 Chronic kidney disease, stage 3 (moderate): Secondary | ICD-10-CM | POA: Diagnosis not present

## 2018-11-05 DIAGNOSIS — I130 Hypertensive heart and chronic kidney disease with heart failure and stage 1 through stage 4 chronic kidney disease, or unspecified chronic kidney disease: Secondary | ICD-10-CM | POA: Diagnosis not present

## 2018-11-05 DIAGNOSIS — E1122 Type 2 diabetes mellitus with diabetic chronic kidney disease: Secondary | ICD-10-CM | POA: Diagnosis not present

## 2018-11-05 DIAGNOSIS — Z6825 Body mass index (BMI) 25.0-25.9, adult: Secondary | ICD-10-CM | POA: Diagnosis not present

## 2018-11-05 DIAGNOSIS — E1142 Type 2 diabetes mellitus with diabetic polyneuropathy: Secondary | ICD-10-CM | POA: Diagnosis not present

## 2018-11-05 DIAGNOSIS — I428 Other cardiomyopathies: Secondary | ICD-10-CM | POA: Diagnosis not present

## 2018-11-08 ENCOUNTER — Emergency Department (HOSPITAL_COMMUNITY): Payer: Medicare HMO

## 2018-11-08 ENCOUNTER — Telehealth (HOSPITAL_COMMUNITY): Payer: Self-pay

## 2018-11-08 ENCOUNTER — Inpatient Hospital Stay (HOSPITAL_COMMUNITY)
Admission: EM | Admit: 2018-11-08 | Discharge: 2018-11-10 | DRG: 641 | Disposition: A | Discharge status: Home Health | Payer: Medicare HMO | Attending: Internal Medicine | Admitting: Internal Medicine

## 2018-11-08 ENCOUNTER — Encounter (HOSPITAL_COMMUNITY): Payer: Self-pay | Admitting: Emergency Medicine

## 2018-11-08 ENCOUNTER — Other Ambulatory Visit: Payer: Self-pay

## 2018-11-08 DIAGNOSIS — E669 Obesity, unspecified: Secondary | ICD-10-CM | POA: Diagnosis present

## 2018-11-08 DIAGNOSIS — E876 Hypokalemia: Secondary | ICD-10-CM

## 2018-11-08 DIAGNOSIS — F419 Anxiety disorder, unspecified: Secondary | ICD-10-CM | POA: Diagnosis present

## 2018-11-08 DIAGNOSIS — Z9981 Dependence on supplemental oxygen: Secondary | ICD-10-CM

## 2018-11-08 DIAGNOSIS — E785 Hyperlipidemia, unspecified: Secondary | ICD-10-CM | POA: Diagnosis present

## 2018-11-08 DIAGNOSIS — R471 Dysarthria and anarthria: Secondary | ICD-10-CM | POA: Diagnosis present

## 2018-11-08 DIAGNOSIS — I428 Other cardiomyopathies: Secondary | ICD-10-CM | POA: Diagnosis present

## 2018-11-08 DIAGNOSIS — Z95810 Presence of automatic (implantable) cardiac defibrillator: Secondary | ICD-10-CM

## 2018-11-08 DIAGNOSIS — F329 Major depressive disorder, single episode, unspecified: Secondary | ICD-10-CM | POA: Diagnosis present

## 2018-11-08 DIAGNOSIS — Z9181 History of falling: Secondary | ICD-10-CM

## 2018-11-08 DIAGNOSIS — J9610 Chronic respiratory failure, unspecified whether with hypoxia or hypercapnia: Secondary | ICD-10-CM | POA: Diagnosis present

## 2018-11-08 DIAGNOSIS — Z20828 Contact with and (suspected) exposure to other viral communicable diseases: Secondary | ICD-10-CM | POA: Diagnosis present

## 2018-11-08 DIAGNOSIS — N183 Chronic kidney disease, stage 3 unspecified: Secondary | ICD-10-CM

## 2018-11-08 DIAGNOSIS — I5022 Chronic systolic (congestive) heart failure: Secondary | ICD-10-CM | POA: Diagnosis present

## 2018-11-08 DIAGNOSIS — E1142 Type 2 diabetes mellitus with diabetic polyneuropathy: Secondary | ICD-10-CM | POA: Diagnosis present

## 2018-11-08 DIAGNOSIS — Z87891 Personal history of nicotine dependence: Secondary | ICD-10-CM

## 2018-11-08 DIAGNOSIS — M1711 Unilateral primary osteoarthritis, right knee: Secondary | ICD-10-CM | POA: Diagnosis present

## 2018-11-08 DIAGNOSIS — I951 Orthostatic hypotension: Secondary | ICD-10-CM | POA: Diagnosis present

## 2018-11-08 DIAGNOSIS — R479 Unspecified speech disturbances: Secondary | ICD-10-CM | POA: Diagnosis not present

## 2018-11-08 DIAGNOSIS — C50919 Malignant neoplasm of unspecified site of unspecified female breast: Secondary | ICD-10-CM | POA: Diagnosis present

## 2018-11-08 DIAGNOSIS — I130 Hypertensive heart and chronic kidney disease with heart failure and stage 1 through stage 4 chronic kidney disease, or unspecified chronic kidney disease: Secondary | ICD-10-CM | POA: Diagnosis not present

## 2018-11-08 DIAGNOSIS — R51 Headache: Secondary | ICD-10-CM | POA: Diagnosis not present

## 2018-11-08 DIAGNOSIS — Z6831 Body mass index (BMI) 31.0-31.9, adult: Secondary | ICD-10-CM

## 2018-11-08 DIAGNOSIS — Z9221 Personal history of antineoplastic chemotherapy: Secondary | ICD-10-CM

## 2018-11-08 DIAGNOSIS — Q359 Cleft palate, unspecified: Secondary | ICD-10-CM

## 2018-11-08 DIAGNOSIS — E039 Hypothyroidism, unspecified: Secondary | ICD-10-CM | POA: Diagnosis present

## 2018-11-08 DIAGNOSIS — R4781 Slurred speech: Secondary | ICD-10-CM | POA: Diagnosis present

## 2018-11-08 DIAGNOSIS — Z888 Allergy status to other drugs, medicaments and biological substances status: Secondary | ICD-10-CM

## 2018-11-08 DIAGNOSIS — J811 Chronic pulmonary edema: Secondary | ICD-10-CM | POA: Diagnosis not present

## 2018-11-08 DIAGNOSIS — Z882 Allergy status to sulfonamides status: Secondary | ICD-10-CM

## 2018-11-08 DIAGNOSIS — I872 Venous insufficiency (chronic) (peripheral): Secondary | ICD-10-CM | POA: Diagnosis present

## 2018-11-08 DIAGNOSIS — K219 Gastro-esophageal reflux disease without esophagitis: Secondary | ICD-10-CM | POA: Diagnosis present

## 2018-11-08 DIAGNOSIS — Z79899 Other long term (current) drug therapy: Secondary | ICD-10-CM

## 2018-11-08 DIAGNOSIS — E1122 Type 2 diabetes mellitus with diabetic chronic kidney disease: Secondary | ICD-10-CM | POA: Diagnosis present

## 2018-11-08 DIAGNOSIS — I452 Bifascicular block: Secondary | ICD-10-CM | POA: Diagnosis present

## 2018-11-08 DIAGNOSIS — I4581 Long QT syndrome: Secondary | ICD-10-CM | POA: Diagnosis present

## 2018-11-08 DIAGNOSIS — Z03818 Encounter for observation for suspected exposure to other biological agents ruled out: Secondary | ICD-10-CM | POA: Diagnosis not present

## 2018-11-08 DIAGNOSIS — Z79890 Hormone replacement therapy (postmenopausal): Secondary | ICD-10-CM

## 2018-11-08 DIAGNOSIS — R42 Dizziness and giddiness: Secondary | ICD-10-CM | POA: Diagnosis not present

## 2018-11-08 DIAGNOSIS — Z853 Personal history of malignant neoplasm of breast: Secondary | ICD-10-CM

## 2018-11-08 DIAGNOSIS — Z923 Personal history of irradiation: Secondary | ICD-10-CM

## 2018-11-08 DIAGNOSIS — Z881 Allergy status to other antibiotic agents status: Secondary | ICD-10-CM

## 2018-11-08 DIAGNOSIS — Z885 Allergy status to narcotic agent status: Secondary | ICD-10-CM

## 2018-11-08 HISTORY — DX: Hypokalemia: E87.6

## 2018-11-08 HISTORY — DX: Chronic kidney disease, stage 3 unspecified: N18.30

## 2018-11-08 LAB — TSH: TSH: 0.168 u[IU]/mL — ABNORMAL LOW (ref 0.350–4.500)

## 2018-11-08 LAB — CBC
HCT: 36.6 % (ref 36.0–46.0)
Hemoglobin: 12.1 g/dL (ref 12.0–15.0)
MCH: 33.2 pg (ref 26.0–34.0)
MCHC: 33.1 g/dL (ref 30.0–36.0)
MCV: 100.5 fL — ABNORMAL HIGH (ref 80.0–100.0)
Platelets: 274 10*3/uL (ref 150–400)
RBC: 3.64 MIL/uL — ABNORMAL LOW (ref 3.87–5.11)
RDW: 13.5 % (ref 11.5–15.5)
WBC: 7.2 10*3/uL (ref 4.0–10.5)
nRBC: 0 % (ref 0.0–0.2)

## 2018-11-08 LAB — COMPREHENSIVE METABOLIC PANEL
ALT: 29 U/L (ref 0–44)
AST: 46 U/L — ABNORMAL HIGH (ref 15–41)
Albumin: 3.3 g/dL — ABNORMAL LOW (ref 3.5–5.0)
Alkaline Phosphatase: 152 U/L — ABNORMAL HIGH (ref 38–126)
Anion gap: 15 (ref 5–15)
BUN: 25 mg/dL — ABNORMAL HIGH (ref 8–23)
CO2: 33 mmol/L — ABNORMAL HIGH (ref 22–32)
Calcium: 8.8 mg/dL — ABNORMAL LOW (ref 8.9–10.3)
Chloride: 93 mmol/L — ABNORMAL LOW (ref 98–111)
Creatinine, Ser: 1.75 mg/dL — ABNORMAL HIGH (ref 0.44–1.00)
GFR calc Af Amer: 35 mL/min — ABNORMAL LOW (ref >60)
GFR calc non Af Amer: 30 mL/min — ABNORMAL LOW (ref >60)
Glucose, Bld: 99 mg/dL (ref 70–99)
Potassium: <2 mmol/L — CL (ref 3.5–5.1)
Sodium: 141 mmol/L (ref 135–145)
Total Bilirubin: 0.6 mg/dL (ref 0.3–1.2)
Total Protein: 7.7 g/dL (ref 6.5–8.1)

## 2018-11-08 LAB — URINALYSIS, ROUTINE W REFLEX MICROSCOPIC
Bilirubin Urine: NEGATIVE
Glucose, UA: NEGATIVE mg/dL
Hgb urine dipstick: NEGATIVE
Ketones, ur: NEGATIVE mg/dL
Nitrite: NEGATIVE
Protein, ur: NEGATIVE mg/dL
Specific Gravity, Urine: 1.004 — ABNORMAL LOW (ref 1.005–1.030)
pH: 8 (ref 5.0–8.0)

## 2018-11-08 LAB — DIFFERENTIAL
Abs Immature Granulocytes: 0.04 10*3/uL (ref 0.00–0.07)
Basophils Absolute: 0 10*3/uL (ref 0.0–0.1)
Basophils Relative: 0 %
Eosinophils Absolute: 0.3 10*3/uL (ref 0.0–0.5)
Eosinophils Relative: 4 %
Immature Granulocytes: 1 %
Lymphocytes Relative: 19 %
Lymphs Abs: 1.4 10*3/uL (ref 0.7–4.0)
Monocytes Absolute: 0.8 10*3/uL (ref 0.1–1.0)
Monocytes Relative: 11 %
Neutro Abs: 4.8 10*3/uL (ref 1.7–7.7)
Neutrophils Relative %: 65 %

## 2018-11-08 LAB — PROTIME-INR
INR: 1.2 (ref 0.8–1.2)
Prothrombin Time: 14.6 seconds (ref 11.4–15.2)

## 2018-11-08 LAB — BRAIN NATRIURETIC PEPTIDE: B Natriuretic Peptide: 95.5 pg/mL (ref 0.0–100.0)

## 2018-11-08 LAB — RAPID URINE DRUG SCREEN, HOSP PERFORMED
Amphetamines: NOT DETECTED
Barbiturates: NOT DETECTED
Benzodiazepines: NOT DETECTED
Cocaine: NOT DETECTED
Opiates: NOT DETECTED
Tetrahydrocannabinol: NOT DETECTED

## 2018-11-08 LAB — APTT: aPTT: 28 seconds (ref 24–36)

## 2018-11-08 LAB — ETHANOL: Alcohol, Ethyl (B): <10 mg/dL (ref <10)

## 2018-11-08 MED ORDER — GABAPENTIN 400 MG PO CAPS
800.0000 mg | ORAL_CAPSULE | Freq: Every evening | ORAL | Status: DC | PRN
  Administered 2018-11-09 (×2): 800 mg via ORAL
  Filled 2018-11-08 (×2): qty 2

## 2018-11-08 MED ORDER — CARBOXYMETHYLCELLULOSE SODIUM 0.5 % OP SOLN: 1.0000 [drp] | Freq: Three times a day (TID) | OPHTHALMIC | Status: DC | PRN

## 2018-11-08 MED ORDER — SODIUM CHLORIDE 0.9% FLUSH: 3.0000 mL | Freq: Two times a day (BID) | INTRAVENOUS | Status: DC

## 2018-11-08 MED ORDER — ACETAMINOPHEN 650 MG RE SUPP: 650.0000 mg | Freq: Four times a day (QID) | RECTAL | Status: DC | PRN

## 2018-11-08 MED ORDER — MAGNESIUM SULFATE 2 GM/50ML IV SOLN
2.0000 g | Freq: Once | INTRAVENOUS | Status: AC
  Administered 2018-11-08: 2 g via INTRAVENOUS
  Filled 2018-11-08: qty 50

## 2018-11-08 MED ORDER — TORSEMIDE 20 MG PO TABS
60.0000 mg | ORAL_TABLET | Freq: Every day | ORAL | Status: DC
  Administered 2018-11-09 – 2018-11-10 (×2): 60 mg via ORAL
  Filled 2018-11-08 (×2): qty 3

## 2018-11-08 MED ORDER — COLESTIPOL HCL 1 G PO TABS
2.0000 g | ORAL_TABLET | Freq: Every day | ORAL | Status: DC
  Administered 2018-11-09 – 2018-11-10 (×2): 2 g via ORAL
  Filled 2018-11-08 (×2): qty 2

## 2018-11-08 MED ORDER — CITALOPRAM HYDROBROMIDE 20 MG PO TABS
20.0000 mg | ORAL_TABLET | Freq: Every day | ORAL | Status: DC
  Administered 2018-11-09 – 2018-11-10 (×2): 20 mg via ORAL
  Filled 2018-11-08 (×2): qty 1

## 2018-11-08 MED ORDER — CARVEDILOL 3.125 MG PO TABS
3.1250 mg | ORAL_TABLET | Freq: Two times a day (BID) | ORAL | Status: DC
  Administered 2018-11-08 – 2018-11-10 (×4): 3.125 mg via ORAL
  Filled 2018-11-08 (×5): qty 1

## 2018-11-08 MED ORDER — LORATADINE 10 MG PO TABS
10.0000 mg | ORAL_TABLET | Freq: Every day | ORAL | Status: DC
  Administered 2018-11-09 – 2018-11-10 (×2): 10 mg via ORAL
  Filled 2018-11-08 (×2): qty 1

## 2018-11-08 MED ORDER — LEVOTHYROXINE SODIUM 100 MCG PO TABS
100.0000 ug | ORAL_TABLET | Freq: Every day | ORAL | Status: DC
  Administered 2018-11-09 – 2018-11-10 (×2): 100 ug via ORAL
  Filled 2018-11-08 (×2): qty 1

## 2018-11-08 MED ORDER — POTASSIUM CHLORIDE CRYS ER 20 MEQ PO TBCR
40.0000 meq | EXTENDED_RELEASE_TABLET | Freq: Once | ORAL | Status: AC
  Administered 2018-11-08: 40 meq via ORAL
  Filled 2018-11-08: qty 2

## 2018-11-08 MED ORDER — BUPROPION HCL ER (XL) 150 MG PO TB24
450.0000 mg | ORAL_TABLET | Freq: Every morning | ORAL | Status: DC
  Administered 2018-11-09 – 2018-11-10 (×2): 450 mg via ORAL
  Filled 2018-11-08 (×2): qty 3

## 2018-11-08 MED ORDER — DOCUSATE SODIUM 100 MG PO CAPS
100.0000 mg | ORAL_CAPSULE | Freq: Two times a day (BID) | ORAL | Status: DC
  Administered 2018-11-09 – 2018-11-10 (×3): 100 mg via ORAL
  Filled 2018-11-08 (×4): qty 1

## 2018-11-08 MED ORDER — ONDANSETRON HCL 4 MG PO TABS
4.0000 mg | ORAL_TABLET | Freq: Four times a day (QID) | ORAL | Status: DC | PRN
  Filled 2018-11-08: qty 1

## 2018-11-08 MED ORDER — ZOLPIDEM TARTRATE 5 MG PO TABS
5.0000 mg | ORAL_TABLET | Freq: Every evening | ORAL | Status: DC | PRN
  Administered 2018-11-08 – 2018-11-09 (×2): 5 mg via ORAL
  Filled 2018-11-08 (×3): qty 1

## 2018-11-08 MED ORDER — ONDANSETRON HCL 4 MG/2ML IJ SOLN: 4.0000 mg | Freq: Four times a day (QID) | INTRAMUSCULAR | Status: DC | PRN

## 2018-11-08 MED ORDER — MIDODRINE HCL 5 MG PO TABS
10.0000 mg | ORAL_TABLET | Freq: Three times a day (TID) | ORAL | Status: DC
  Administered 2018-11-09 – 2018-11-10 (×5): 10 mg via ORAL
  Filled 2018-11-08 (×5): qty 2

## 2018-11-08 MED ORDER — CLONAZEPAM 0.5 MG PO TABS: 0.5000 mg | ORAL_TABLET | Freq: Three times a day (TID) | ORAL | Status: DC | PRN

## 2018-11-08 MED ORDER — ENOXAPARIN SODIUM 40 MG/0.4ML ~~LOC~~ SOLN
40.0000 mg | SUBCUTANEOUS | Status: DC
  Administered 2018-11-08 – 2018-11-09 (×2): 40 mg via SUBCUTANEOUS
  Filled 2018-11-08 (×2): qty 0.4

## 2018-11-08 MED ORDER — GABAPENTIN 300 MG PO CAPS
300.0000 mg | ORAL_CAPSULE | Freq: Three times a day (TID) | ORAL | Status: DC
  Administered 2018-11-08 – 2018-11-10 (×5): 300 mg via ORAL
  Filled 2018-11-08 (×5): qty 1

## 2018-11-08 MED ORDER — POTASSIUM CHLORIDE CRYS ER 20 MEQ PO TBCR
40.0000 meq | EXTENDED_RELEASE_TABLET | Freq: Two times a day (BID) | ORAL | Status: AC
  Administered 2018-11-09 (×2): 40 meq via ORAL
  Filled 2018-11-08: qty 2

## 2018-11-08 MED ORDER — POTASSIUM CHLORIDE 10 MEQ/100ML IV SOLN
10.0000 meq | INTRAVENOUS | Status: AC
  Administered 2018-11-08 (×4): 10 meq via INTRAVENOUS
  Filled 2018-11-08 (×4): qty 100

## 2018-11-08 MED ORDER — SIMVASTATIN 20 MG PO TABS
10.0000 mg | ORAL_TABLET | Freq: Every evening | ORAL | Status: DC
  Administered 2018-11-09: 10 mg via ORAL
  Filled 2018-11-08: qty 1

## 2018-11-08 MED ORDER — IPRATROPIUM BROMIDE 0.06 % NA SOLN
2.0000 | Freq: Two times a day (BID) | NASAL | Status: DC
  Administered 2018-11-09 – 2018-11-10 (×2): 2 via NASAL
  Filled 2018-11-08: qty 15

## 2018-11-08 MED ORDER — ACETAMINOPHEN 325 MG PO TABS
650.0000 mg | ORAL_TABLET | Freq: Four times a day (QID) | ORAL | Status: DC | PRN
  Administered 2018-11-09: 650 mg via ORAL
  Filled 2018-11-08: qty 2

## 2018-11-08 MED ORDER — MONTELUKAST SODIUM 10 MG PO TABS
10.0000 mg | ORAL_TABLET | Freq: Every day | ORAL | Status: DC
  Administered 2018-11-08 – 2018-11-09 (×2): 10 mg via ORAL
  Filled 2018-11-08 (×2): qty 1

## 2018-11-08 NOTE — ED Notes (Signed)
Meredyth Hornung -[phone]

## 2018-11-08 NOTE — ED Notes (Signed)
ED TO INPATIENT HANDOFF REPORT  ED Nurse Name and Phone #: Sherrine Maples [phone]  S Name/Age/Gender Rhonda [name] 68 y.o. female Room/Bed: 028C/028C  Code Status   Code Status: Full Code  Home/SNF/Other Home Patient oriented to: self, place, time and situation Is this baseline? Yes   Triage Complete: Triage complete  Chief Complaint Slurred Speech; Confusion; HA  Triage Note Pt complains of slurred speech and dizziness since yesterday at 12 noon. Pt also states she has a headache   Allergies Allergies  Allergen Reactions  . Ceftin Anaphylaxis    Face and throat swell   . Shellfish Allergy Other (See Comments)    Gout exacerbation  . Geodon [Ziprasidone Hcl] Hives  . Lisinopril Other (See Comments)    angioedema  . Allopurinol Nausea Only and Other (See Comments)    weakness  . Ativan [Lorazepam] Itching  . Sulfa Antibiotics Itching  . Ultram [Tramadol Hcl] Itching    Level of Care/Admitting Diagnosis ED Disposition    ED Disposition Condition [facility] Area: [place] [[id number]]  Level of Care: Telemetry Medical [104]  I expect the patient will be discharged within 24 hours: Yes  LOW acuity---Tx typically complete <24 hrs---ACUTE conditions typically can be evaluated <24 hours---LABS likely to return to acceptable levels <24 hours---IS near functional baseline---EXPECTED to return to current living arrangement---NOT newly hypoxic: Meets criteria for 5C-Observation unit  Covid Evaluation: Screening Protocol (No Symptoms)  Diagnosis: Hypokalemia [962229]  Admitting Physician: Karmen Bongo [[id number]]  Attending Physician: Karmen Bongo [[id number]]  PT Class (Do Not Modify): Observation [104]  PT Acc Code (Do Not Modify): Observation [10022]       B Medical/Surgery History Past Medical History:  Diagnosis Date  . Anemia   . Anxiety   . Arthritis    RIGHT KNEE ARTHRITIS AND PAIN-PT TOLD BONE ON BONE  . Asthma   . Back pain     DISK PROBLEM  . Breast cancer, stage 1 ([place]) 03/26/2011   left-FINISHED CHEMO  AND RADIATION  . Cardiomyopathy    [name]  . Chronic systolic heart failure (HCC)    a) NICM b) ECHO (03/2013) EF 20-25% c) ECHO (09/2013) EF 45-50%, grade I DD  . Complication of anesthesia    PT STATES HER B/P LOW AFTER ONE OF HER SURGERIES--SHE ATTRIBUTES TO LYING FLAT  . Depression   . Diabetes mellitus    "diet controlled" (05/04/2103)  . Exertional shortness of breath   . GERD (gastroesophageal reflux disease)   . Gout   . Heart murmur   . Hyperlipidemia   . Hypertension   . Hypothyroidism   . Left bundle branch block    s/p CRT-D (04/2013)  . Migraines   . On home oxygen therapy    "2L suppose to be q night" (05/03/2013)  . Peripheral neuropathy    feet  . SVD (spontaneous vaginal delivery)    x 2  . Unspecified vitamin D deficiency 03/26/2011   does not take meds   Past Surgical History:  Procedure Laterality Date  . BI-VENTRICULAR IMPLANTABLE CARDIOVERTER DEFIBRILLATOR N/A 05/03/2013   Procedure: BI-VENTRICULAR IMPLANTABLE CARDIOVERTER DEFIBRILLATOR  (CRT-D);  Surgeon: Evans Lance, MD;  Location: [facility] CATH LAB;  Service: Cardiovascular;  Laterality: N/A;  . BI-VENTRICULAR IMPLANTABLE CARDIOVERTER DEFIBRILLATOR  (CRT-D)  05/03/2013   MDT CRTD implanted by Dr Lovena Le for non ischemic cardiomyopathy  . BIOPSY  05/31/2018   Procedure: BIOPSY;  Surgeon: Therisa Doyne,  Megan Salon, MD;  Location: [facility];  Service: Gastroenterology;;  . BREAST LUMPECTOMY Left 07/28/2010  . CARDIAC CATHETERIZATION  2010   NORMAL CORONARY ARTERIES  . 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--11 SURGERIES     PT HAS REMOVABLE SPEECH BULB-TAKES IT OUT BEFORE HER SURGERY  . COLONOSCOPY    . COLONOSCOPY N/A 05/31/2018   Procedure: COLONOSCOPY;  Surgeon: Ronnette Juniper, MD;  Location: [facility];  Service: Gastroenterology;  Laterality: N/A;  . HYSTEROSCOPY W/D&C  01/20/2012   Procedure: DILATATION AND CURETTAGE  /HYSTEROSCOPY;  Surgeon: Margarette Asal, MD;  Location: [facility];  Service: Gynecology;  Laterality: N/A;  with trueclear  . PORT-A-CATH REMOVAL  09/23/2011   Procedure: REMOVAL PORT-A-CATH;  Surgeon: Rolm Bookbinder, MD;  Location: [facility];  Service: General;  Laterality: N/A;  Port Removal  . PORTACATH PLACEMENT  2012  . TONSILLECTOMY  1960's  . TOTAL KNEE ARTHROPLASTY  05/10/2012   Procedure: TOTAL KNEE ARTHROPLASTY;  Surgeon: Mauri Pole, MD;  Location: [facility];  Service: Orthopedics;  Laterality: Right;  . WISDOM TOOTH EXTRACTION       A IV Location/Drains/Wounds Patient Lines/Drains/Airways Status   Active Line/Drains/Airways    Name:   Placement date:   Placement time:   Site:   Days:   Implanted Port 08/18/10 Right Chest   08/18/10    -    Chest   3004   Peripheral IV 11/08/18 Right Antecubital   11/08/18    1412    Antecubital   less than 1   Incision 09/23/11 Chest Other (Comment)   09/23/11    1107     2603   Incision 01/20/12 Perineum Other (Comment)   01/20/12    0702     2484   Incision 05/10/12 Leg   05/10/12    1350     2373   Incision 05/10/12 Knee Right   05/10/12    1420     2373   Incision 05/03/13 Chest Left   05/03/13    1800     2015          Intake/Output Last 24 hours No intake or output data in the 24 hours ending 11/08/18 1841  Labs/Imaging Results for orders placed or performed during the hospital encounter of 11/08/18 (from the past 48 hour(s))  Ethanol     Status: None   Collection Time: 11/08/18  2:00 PM  Result Value Ref Range   Alcohol, Ethyl (B) <10 <10 mg/dL    Comment: (NOTE) Lowest detectable limit for serum alcohol is 10 mg/dL. For medical purposes only. Performed at [facility], [place] [address], [place], [place] [address]   Protime-INR     Status: None   Collection Time: 11/08/18  2:00 PM  Result Value Ref Range   Prothrombin Time 14.6 11.4 - 15.2 seconds   INR 1.2 0.8 - 1.2    Comment: (NOTE) INR goal varies based on  device and disease states. Performed at [facility], [place] [address], [place], [place] [address]   APTT     Status: None   Collection Time: 11/08/18  2:00 PM  Result Value Ref Range   aPTT 28 24 - 36 seconds    Comment: Performed at [facility] [address] [place], [place] [address]  CBC     Status: Abnormal   Collection Time: 11/08/18  2:00 PM  Result Value Ref Range   WBC 7.2 4.0 - 10.5 K/uL  RBC 3.64 (L) 3.87 - 5.11 MIL/uL   Hemoglobin 12.1 12.0 - 15.0 g/dL   HCT 36.6 36.0 - 46.0 %   MCV 100.5 (H) 80.0 - 100.0 fL   MCH 33.2 26.0 - 34.0 pg   MCHC 33.1 30.0 - 36.0 g/dL   RDW 13.5 11.5 - 15.5 %   Platelets 274 150 - 400 K/uL   nRBC 0.0 0.0 - 0.2 %    Comment: Performed at [facility] [address] [place], [place] [address]  Differential     Status: None   Collection Time: 11/08/18  2:00 PM  Result Value Ref Range   Neutrophils Relative % 65 %   Neutro Abs 4.8 1.7 - 7.7 K/uL   Lymphocytes Relative 19 %   Lymphs Abs 1.4 0.7 - 4.0 K/uL   Monocytes Relative 11 %   Monocytes Absolute 0.8 0.1 - 1.0 K/uL   Eosinophils Relative 4 %   Eosinophils Absolute 0.3 0.0 - 0.5 K/uL   Basophils Relative 0 %   Basophils Absolute 0.0 0.0 - 0.1 K/uL   Immature Granulocytes 1 %   Abs Immature Granulocytes 0.04 0.00 - 0.07 K/uL    Comment: Performed at [facility] [address], [place], [place] [address]  Comprehensive metabolic panel     Status: Abnormal   Collection Time: 11/08/18  2:00 PM  Result Value Ref Range   Sodium 141 135 - 145 mmol/L   Potassium <2.0 (LL) 3.5 - 5.1 mmol/L    Comment: CRITICAL RESULT CALLED TO, READ BACK BY AND VERIFIED WITH: Gerrie Nordmann RN [phone] [id number] BY A BENNETT    Chloride 93 (L) 98 - 111 mmol/L   CO2 33 (H) 22 - 32 mmol/L   Glucose, Bld 99 70 - 99 mg/dL   BUN 25 (H) 8 - 23 mg/dL   Creatinine, Ser 1.75 (H) 0.44 - 1.00 mg/dL   Calcium 8.8 (L) 8.9 - 10.3 mg/dL   Total Protein 7.7 6.5 - 8.1 g/dL   Albumin 3.3 (L) 3.5 -  5.0 g/dL   AST 46 (H) 15 - 41 U/L   ALT 29 0 - 44 U/L   Alkaline Phosphatase 152 (H) 38 - 126 U/L   Total Bilirubin 0.6 0.3 - 1.2 mg/dL   GFR calc non Af Amer 30 (L) >60 mL/min   GFR calc Af Amer 35 (L) >60 mL/min   Anion gap 15 5 - 15    Comment: Performed at [facility], [address] [place], [place] [address]  Brain natriuretic peptide     Status: None   Collection Time: 11/08/18  2:00 PM  Result Value Ref Range   B Natriuretic Peptide 95.5 0.0 - 100.0 pg/mL    Comment: Performed at [facility] [address], [place], [place] [address]  Urine rapid drug screen (hosp performed)     Status: None   Collection Time: 11/08/18  2:15 PM  Result Value Ref Range   Opiates NONE DETECTED NONE DETECTED   Cocaine NONE DETECTED NONE DETECTED   Benzodiazepines NONE DETECTED NONE DETECTED   Amphetamines NONE DETECTED NONE DETECTED   Tetrahydrocannabinol NONE DETECTED NONE DETECTED   Barbiturates NONE DETECTED NONE DETECTED    Comment: (NOTE) DRUG SCREEN FOR MEDICAL PURPOSES ONLY.  IF CONFIRMATION IS NEEDED FOR ANY PURPOSE, NOTIFY LAB WITHIN 5 DAYS. LOWEST DETECTABLE LIMITS FOR URINE DRUG SCREEN Drug Class  Cutoff (ng/mL) Amphetamine and metabolites    1000 Barbiturate and metabolites    200 Benzodiazepine                 151 Tricyclics and metabolites     300 Opiates and metabolites        300 Cocaine and metabolites        300 THC                            50 Performed at [facility], [place] [address], [place], [place] [address]   Urinalysis, Routine w reflex microscopic     Status: Abnormal   Collection Time: 11/08/18  2:15 PM  Result Value Ref Range   Color, Urine STRAW (A) YELLOW   APPearance CLEAR CLEAR   Specific Gravity, Urine 1.004 (L) 1.005 - 1.030   pH 8.0 5.0 - 8.0   Glucose, UA NEGATIVE NEGATIVE mg/dL   Hgb urine dipstick NEGATIVE NEGATIVE   Bilirubin Urine NEGATIVE NEGATIVE   Ketones, ur NEGATIVE NEGATIVE mg/dL   Protein,  ur NEGATIVE NEGATIVE mg/dL   Nitrite NEGATIVE NEGATIVE   Leukocytes,Ua SMALL (A) NEGATIVE   RBC / HPF 0-5 0 - 5 RBC/hpf   WBC, UA 6-10 0 - 5 WBC/hpf   Bacteria, UA RARE (A) NONE SEEN   Squamous Epithelial / LPF 0-5 0 - 5    Comment: Performed at [facility], [address], [place], [place] [address]   Dg Chest 2 View  Result Date: 11/08/2018 CLINICAL DATA:  Peripheral edema. Mental status changes. EXAM: CHEST - 2 VIEW COMPARISON:  06/21/2017 FINDINGS: Lower thoracic spondylosis. Similar appearance of pacer/AICD device. Surgical clips project over the left breast. Midline trachea. Normal heart size. Tortuous thoracic aorta. Atherosclerosis in the transverse aorta. No pleural effusion or pneumothorax. Clear lungs. IMPRESSION: No acute cardiopulmonary disease. Aortic Atherosclerosis (ICD10-I70.0). Electronically Signed   By: Abigail Miyamoto M.D.   On: 11/08/2018 14:03   Ct Head Wo Contrast  Result Date: 11/08/2018 CLINICAL DATA:  Slurred speech and dizziness today.  Headache. EXAM: CT HEAD WITHOUT CONTRAST TECHNIQUE: Contiguous axial images were obtained from the base of the skull through the vertex without intravenous contrast. COMPARISON:  CT head 08/10/2018 FINDINGS: Brain: Ventricle size normal. Negative for infarct, hemorrhage, mass. Extensive ossification of the falx. Vascular: Negative for hyperdense vessel Skull: No acute abnormality. Sinuses/Orbits: Negative Other: None IMPRESSION: Negative CT head Electronically Signed   By: Franchot Gallo M.D.   On: 11/08/2018 15:16    Pending Labs Unresulted Labs (From admission, onward)    Start     Ordered   11/09/18 0800  Magnesium  Tomorrow morning,   R     11/08/18 1818   11/09/18 6269  Basic metabolic panel  Tomorrow morning,   R     11/08/18 1818   11/09/18 0800  CBC  Tomorrow morning,   R     11/08/18 1818   11/08/18 1757  TSH  Once,   STAT     11/08/18 1757   11/08/18 1755  HIV antibody (Routine Testing)  Once,   STAT     11/08/18  1757   11/08/18 1439  Novel Coronavirus,NAA,(SEND-OUT TO REF LAB - TAT 24-48 hrs); Hosp Order  (Asymptomatic Patients Labs)  Once,   STAT    Question:  Rule Out  Answer:  Yes   11/08/18 1438  Vitals/Pain Today's Vitals   11/08/18 1715 11/08/18 1745 11/08/18 1800 11/08/18 1815  BP: 120/66 (!) 131/51 122/60 (!) 126/57  Pulse:      Resp: 15 15 16 14   Temp:      TempSrc:      SpO2:      PainSc:        Isolation Precautions No active isolations  Medications Medications  potassium chloride 10 mEq in 100 mL IVPB (10 mEq Intravenous New Bag/Given 11/08/18 1753)  carvedilol (COREG) tablet 3.125 mg (has no administration in time range)  colestipol (COLESTID) tablet 2 g (has no administration in time range)  midodrine (PROAMATINE) tablet 10 mg (has no administration in time range)  simvastatin (ZOCOR) tablet 10 mg (has no administration in time range)  torsemide (DEMADEX) tablet 60 mg (has no administration in time range)  buPROPion (WELLBUTRIN XL) 24 hr tablet 450 mg (has no administration in time range)  citalopram (CELEXA) tablet 20 mg (has no administration in time range)  zolpidem (AMBIEN) tablet 5 mg (has no administration in time range)  levothyroxine (SYNTHROID) tablet 100 mcg (has no administration in time range)  clonazePAM (KLONOPIN) tablet 0.5 mg (has no administration in time range)  gabapentin (NEURONTIN) capsule 300 mg (has no administration in time range)  gabapentin (NEURONTIN) tablet 800 mg (has no administration in time range)  loratadine (CLARITIN) tablet 10 mg (has no administration in time range)  ipratropium (ATROVENT) 0.03 % nasal spray 2 spray (has no administration in time range)  montelukast (SINGULAIR) tablet 10 mg (has no administration in time range)  carboxymethylcellulose (REFRESH PLUS) 0.5 % ophthalmic solution 1 drop (has no administration in time range)  acetaminophen (TYLENOL) tablet 650 mg (has no administration in time range)    Or   acetaminophen (TYLENOL) suppository 650 mg (has no administration in time range)  docusate sodium (COLACE) capsule 100 mg (has no administration in time range)  ondansetron (ZOFRAN) tablet 4 mg (has no administration in time range)    Or  ondansetron (ZOFRAN) injection 4 mg (has no administration in time range)  enoxaparin (LOVENOX) injection 30 mg (has no administration in time range)  sodium chloride flush (NS) 0.9 % injection 3 mL (has no administration in time range)  potassium chloride SA (K-DUR) CR tablet 40 mEq (has no administration in time range)  magnesium sulfate IVPB 2 g 50 mL (0 g Intravenous Stopped 11/08/18 1635)  potassium chloride SA (K-DUR) CR tablet 40 mEq (40 mEq Oral Given 11/08/18 1517)    Mobility walks Moderate fall risk   Focused Assessments    R Recommendations: See Admitting Provider Note  Report given to:   Additional Notes:

## 2018-11-08 NOTE — Telephone Encounter (Signed)
Mr. Madewell called and stated that pt's pcp advised her to go to the hospital last night.  He said that Rhonda [name] was shaking really bad last night and kept loosing her balance. He asked me if I thought that it was necessary, I told him yes to take her to the emergency room for further evaluation.  He said that he will take her POV to [facility].  I will f/u with him later today.

## 2018-11-08 NOTE — ED Triage Notes (Signed)
Pt complains of slurred speech and dizziness since yesterday at 12 noon. Pt also states she has a headache

## 2018-11-08 NOTE — ED Notes (Signed)
Pt ambulatory to the bathroom 

## 2018-11-08 NOTE — ED Provider Notes (Signed)
[facility] EMERGENCY DEPARTMENT Provider Note   CSN: [id number] Arrival date & time: 11/08/18  1251     History   Chief Complaint Chief Complaint  Patient presents with  . Aphasia    HPI Rhonda [name] is a 68 y.o. female presenting for evaluation of slurred speech.  Patient states yesterday around noon she started to feel like she was slurring her speech.  Symptoms have been persistent since.  She denies any other symptoms, including headache, vision changes, extremity weakness/numbness.  She denies chest pain, shortness breath, cough, nausea, vomiting, dental pain, or normal bowel movements.  Patient states she has been urinating frequently due to her doctor increasing her Lasix.  Patient states she has bilateral pedal swelling, which is been worsening recently.  As such, they have increased her Lasix and put her in unna boots.   Additional history obtained per chart review, patient with a history of anxiety, asthma, breast cancer status post chemo and radiation, cardiomyopathy, CHF, depression, diabetes, GERD, hyperlipidemia, hypertension, hypothyroidism, cleft palate.    HPI  Past Medical History:  Diagnosis Date  . Anemia   . Anxiety   . Arthritis    RIGHT KNEE ARTHRITIS AND PAIN-PT TOLD BONE ON BONE  . Asthma   . Back pain    DISK PROBLEM  . Breast cancer, stage 1 ([place]) 03/26/2011   left-FINISHED CHEMO  AND RADIATION  . Cardiomyopathy    [name]  . Chronic systolic heart failure (HCC)    a) NICM b) ECHO (03/2013) EF 20-25% c) ECHO (09/2013) EF 45-50%, grade I DD  . Complication of anesthesia    PT STATES HER B/P LOW AFTER ONE OF HER SURGERIES--SHE ATTRIBUTES TO LYING FLAT  . Depression   . Diabetes mellitus    "diet controlled" (05/04/2103)  . Exertional shortness of breath   . GERD (gastroesophageal reflux disease)   . Gout   . Heart murmur   . Hyperlipidemia   . Hypertension   . Hypothyroidism   . Left bundle  branch block    s/p CRT-D (04/2013)  . Migraines   . On home oxygen therapy    "2L suppose to be q night" (05/03/2013)  . Peripheral neuropathy    feet  . SVD (spontaneous vaginal delivery)    x 2  . Unspecified vitamin D deficiency 03/26/2011   does not take meds    Patient Active Problem List   Diagnosis Date Noted  . Pain, dental 08/31/2018  . Orthostatic hypotension 07/28/2017  . SVD (spontaneous vaginal delivery)   . Peripheral neuropathy   . On home oxygen therapy   . Migraines   . Left bundle branch block   . Hypothyroidism   . Hypertension   . Hyperlipidemia   . Heart murmur   . GERD (gastroesophageal reflux disease)   . Exertional shortness of breath   . Depression   . Complication of anesthesia   . Back pain   . Arthritis   . Anxiety   . Anemia   . HTN (hypertension) 09/14/2013  . Chronic respiratory failure ([place]) 09/14/2013  . 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 Morbid obesity ([place]) 11/01/2012  . Chronic systolic heart failure ([place]) 10/27/2012  . S/P right TKA 05/10/2012  . Endometrial polyp 01/20/2012  . Breast cancer, stage 1 ([place]) 03/26/2011  . Unspecified vitamin D deficiency 03/26/2011    Past Surgical History:  Procedure Laterality Date  . BI-VENTRICULAR IMPLANTABLE CARDIOVERTER  DEFIBRILLATOR N/A 05/03/2013   Procedure: BI-VENTRICULAR IMPLANTABLE CARDIOVERTER DEFIBRILLATOR  (CRT-D);  Surgeon: Evans Lance, MD;  Location: [facility] CATH LAB;  Service: Cardiovascular;  Laterality: N/A;  . BI-VENTRICULAR IMPLANTABLE CARDIOVERTER DEFIBRILLATOR  (CRT-D)  05/03/2013   MDT CRTD implanted by Dr Lovena Le for non ischemic cardiomyopathy  . BIOPSY  05/31/2018   Procedure: BIOPSY;  Surgeon: Ronnette Juniper, MD;  Location: [facility] ENDOSCOPY;  Service: Gastroenterology;;  . BREAST LUMPECTOMY Left 07/28/2010  . CARDIAC CATHETERIZATION  2010   NORMAL CORONARY ARTERIES  . 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--11 SURGERIES     PT HAS  REMOVABLE SPEECH BULB-TAKES IT OUT BEFORE HER SURGERY  . COLONOSCOPY    . COLONOSCOPY N/A 05/31/2018   Procedure: COLONOSCOPY;  Surgeon: Ronnette Juniper, MD;  Location: [facility];  Service: Gastroenterology;  Laterality: N/A;  . HYSTEROSCOPY W/D&C  01/20/2012   Procedure: DILATATION AND CURETTAGE /HYSTEROSCOPY;  Surgeon: Margarette Asal, MD;  Location: [facility];  Service: Gynecology;  Laterality: N/A;  with trueclear  . PORT-A-CATH REMOVAL  09/23/2011   Procedure: REMOVAL PORT-A-CATH;  Surgeon: Rolm Bookbinder, MD;  Location: [facility];  Service: General;  Laterality: N/A;  Port Removal  . PORTACATH PLACEMENT  2012  . TONSILLECTOMY  1960's  . TOTAL KNEE ARTHROPLASTY  05/10/2012   Procedure: TOTAL KNEE ARTHROPLASTY;  Surgeon: Mauri Pole, MD;  Location: [facility];  Service: Orthopedics;  Laterality: Right;  . WISDOM TOOTH EXTRACTION       OB History   No obstetric history on file.      Home Medications    Prior to Admission medications   Medication Sig Start Date End Date Taking? Authorizing Provider  acetaZOLAMIDE (DIAMOX) 125 MG tablet TAKE 1 TABLET(125 MG) BY MOUTH DAILY 10/06/18   [name], Donika K, DO  buPROPion (WELLBUTRIN XL) 150 MG 24 hr tablet Take 450 mg by mouth every morning.     [provider]  carboxymethylcellulose (REFRESH PLUS) 0.5 % SOLN Place 1 drop into both eyes 3 (three) times daily as needed (dry eyes).    [provider]  carvedilol (COREG) 3.125 MG tablet Take 3.125 mg by mouth 2 (two) times daily with a meal.    [provider]  citalopram (CELEXA) 20 MG tablet Take 20 mg by mouth daily.    [provider]  clindamycin (CLEOCIN) 300 MG capsule Take 600 mg by mouth See admin instructions. Take 2 capsules (600 mg) by mouth 1 hour prior to dental procedure.    [provider]  clonazePAM (KLONOPIN) 0.5 MG tablet Take 0.5 mg by mouth 3 (three) times daily as needed for anxiety.     [provider]  colestipol (COLESTID)  1 g tablet Take by mouth 2 (two) times daily.     [provider]  fexofenadine (ALLEGRA) 180 MG tablet Take 180 mg by mouth 2 (two) times daily.    [provider]  gabapentin (NEURONTIN) 300 MG capsule Take 300 mg by mouth 3 (three) times daily.     [provider]  gabapentin (NEURONTIN) 800 MG tablet Take 800 mg by mouth at bedtime as needed (neuropathy).     [provider]  ipratropium (ATROVENT) 0.03 % nasal spray Place 2 sprays into both nostrils 2 (two) times daily.    [provider]  levothyroxine (SYNTHROID, LEVOTHROID) 100 MCG tablet TAKE 1 TABLET(100 MCG) BY MOUTH DAILY 06/10/18   Philemon Kingdom, MD  metolazone (ZAROXOLYN) 2.5 MG tablet Take only as directed by  CHF clinic 11/02/18   Darrick Grinder D, NP  midodrine (PROAMATINE) 10 MG tablet Take 1 tablet (10 mg total) by mouth 3 (three) times daily with meals. 07/01/18   Patel, Donika K, DO  montelukast (SINGULAIR) 10 MG tablet Take 10 mg by mouth at bedtime.  09/28/16   [provider]  omeprazole (PRILOSEC) 40 MG capsule TK 1 C PO QD 30 MIN B BRE FOR 7 DAYS 09/28/18   [provider]  ondansetron (ZOFRAN) 8 MG tablet TK 1 T PO BID FOR 10 DAYS PRF NAUSEA 09/28/18   [provider]  polyethylene glycol (MIRALAX / GLYCOLAX) packet Take 17 g by mouth daily as needed for moderate constipation.    [provider]  polyethylene glycol powder (GLYCOLAX/MIRALAX) 17 GM/SCOOP powder DISSOLVE 1 CAPFUL IN 8 OUNCES OF LIQUID AND TK PO ONCE A DAY UTD 08/18/18   [provider]  potassium chloride SA (K-DUR,KLOR-CON) 20 MEQ tablet Take 3 tablets (60 mEq total) by mouth 2 (two) times daily. Patient not taking: Reported on 11/02/2018 10/29/17   Darrick Grinder D, NP  probenecid (BENEMID) 500 MG tablet TK 1 T PO BID 09/13/18   [provider]  simvastatin (ZOCOR) 10 MG tablet Take 10 mg by mouth every evening.     [provider]  spironolactone (ALDACTONE) 25 MG  tablet Take 0.5 tablets (12.5 mg total) by mouth every evening. 08/05/18   Shirley Friar, PA-C  torsemide (DEMADEX) 20 MG tablet Take 60 mg by mouth daily.    [provider]  zolpidem (AMBIEN) 10 MG tablet Take 10 mg by mouth at bedtime as needed for sleep. For sleep     [provider]    Family History Family History  Problem Relation Age of Onset  . Alzheimer's disease Mother   . CVA Mother   . Hypertension Mother   . Cancer - Colon Father        late 70  . Birth defects Paternal Uncle     Social History Social History   Tobacco Use  . Smoking status: Former Smoker    Packs/day: 0.10    Years: 26.00    Pack years: 2.60    Types: Cigarettes    Quit date: 05/06/2007    Years since quitting: 11.5  . Smokeless tobacco: Never Used  Substance Use Topics  . Alcohol use: No  . Drug use: No     Allergies   Ceftin, Shellfish allergy, Allopurinol, Ativan [lorazepam], Sulfa antibiotics, and Ultram [tramadol hcl]   Review of Systems Review of Systems  Neurological: Positive for speech difficulty.  All other systems reviewed and are negative.    Physical Exam Updated Vital Signs BP (!) 147/83   Pulse 72   Temp 99 F (37.2 C) (Oral)   Resp 13   SpO2 100%   Physical Exam Vitals signs and nursing note reviewed.  Constitutional:      General: She is not in acute distress.    Appearance: She is well-developed.  HENT:     Head: Normocephalic and atraumatic.  Eyes:     Extraocular Movements: Extraocular movements intact.     Conjunctiva/sclera: Conjunctivae normal.     Pupils: Pupils are equal, round, and reactive to light.  Neck:     Musculoskeletal: Normal range of motion and neck supple.  Cardiovascular:     Rate and Rhythm: Normal rate and regular rhythm.     Pulses: Normal pulses.  Pulmonary:  Effort: Pulmonary effort is normal. No respiratory distress.     Breath sounds: Normal breath sounds. No wheezing.  Abdominal:      General: There is no distension.     Palpations: Abdomen is soft. There is no mass.     Tenderness: There is no abdominal tenderness. There is no guarding or rebound.  Musculoskeletal: Normal range of motion.     Comments: In Unna boots bilaterally.  Patient with edema surrounding the boots.  Good cap refill of distal toes. Strength and sensation intact x4.   Skin:    General: Skin is warm and dry.     Capillary Refill: Capillary refill takes less than 2 seconds.  Neurological:     Mental Status: She is alert and oriented to person, place, and time.     GCS: GCS eye subscore is 4. GCS verbal subscore is 5. GCS motor subscore is 6.     Sensory: Sensation is intact.     Motor: Motor function is intact.     Coordination: Coordination is intact.     Comments: Speech was abnormal, however patient with cleft palate and with baseline abnormal speech.  I am unable to assess if this is different from her baseline. No other focal deficits on exam      ED Treatments / Results  Labs (all labs ordered are listed, but only abnormal results are displayed) Labs Reviewed  CBC - Abnormal; Notable for the following components:      Result Value   RBC 3.64 (*)    MCV 100.5 (*)    All other components within normal limits  COMPREHENSIVE METABOLIC PANEL - Abnormal; Notable for the following components:   Potassium <2.0 (*)    Chloride 93 (*)    CO2 33 (*)    BUN 25 (*)    Creatinine, Ser 1.75 (*)    Calcium 8.8 (*)    Albumin 3.3 (*)    AST 46 (*)    Alkaline Phosphatase 152 (*)    GFR calc non Af Amer 30 (*)    GFR calc Af Amer 35 (*)    All other components within normal limits  URINALYSIS, ROUTINE W REFLEX MICROSCOPIC - Abnormal; Notable for the following components:   Color, Urine STRAW (*)    Specific Gravity, Urine 1.004 (*)    Leukocytes,Ua SMALL (*)    Bacteria, UA RARE (*)    All other components within normal limits  NOVEL CORONAVIRUS, NAA (HOSPITAL ORDER, SEND-OUT TO REF LAB)   ETHANOL  PROTIME-INR  APTT  DIFFERENTIAL  RAPID URINE DRUG SCREEN, HOSP PERFORMED  BRAIN NATRIURETIC PEPTIDE  MAGNESIUM  I-STAT CHEM 8, ED    EKG    Radiology Dg Chest 2 View  Result Date: 11/08/2018 CLINICAL DATA:  Peripheral edema. Mental status changes. EXAM: CHEST - 2 VIEW COMPARISON:  06/21/2017 FINDINGS: Lower thoracic spondylosis. Similar appearance of pacer/AICD device. Surgical clips project over the left breast. Midline trachea. Normal heart size. Tortuous thoracic aorta. Atherosclerosis in the transverse aorta. No pleural effusion or pneumothorax. Clear lungs. IMPRESSION: No acute cardiopulmonary disease. Aortic Atherosclerosis (ICD10-I70.0). Electronically Signed   By: Abigail Miyamoto M.D.   On: 11/08/2018 14:03   Ct Head Wo Contrast  Result Date: 11/08/2018 CLINICAL DATA:  Slurred speech and dizziness today.  Headache. EXAM: CT HEAD WITHOUT CONTRAST TECHNIQUE: Contiguous axial images were obtained from the base of the skull through the vertex without intravenous contrast. COMPARISON:  CT head 08/10/2018 FINDINGS: Brain: Ventricle size  normal. Negative for infarct, hemorrhage, mass. Extensive ossification of the falx. Vascular: Negative for hyperdense vessel Skull: No acute abnormality. Sinuses/Orbits: Negative Other: None IMPRESSION: Negative CT head Electronically Signed   By: Franchot Gallo M.D.   On: 11/08/2018 15:16    Procedures .Critical Care Performed by: Franchot Heidelberg, PA-C Authorized by: Franchot Heidelberg, PA-C   Critical care provider statement:    Critical care time (minutes):  45   Critical care time was exclusive of:  Separately billable procedures and treating other patients and teaching time   Critical care was necessary to treat or prevent imminent or life-threatening deterioration of the following conditions:  Metabolic crisis   Critical care was time spent personally by me on the following activities:  Blood draw for specimens, development of treatment  plan with patient or surrogate, evaluation of patient's response to treatment, examination of patient, obtaining history from patient or surrogate, ordering and performing treatments and interventions, ordering and review of laboratory studies, pulse oximetry, ordering and review of radiographic studies, re-evaluation of patient's condition and review of old charts   I assumed direction of critical care for this patient from another provider in my specialty: no   Comments:     Pt with critically low potassium at <2. Oral and IV potassium started. Mag started. Will admit.    (including critical care time)  Medications Ordered in ED Medications  magnesium sulfate IVPB 2 g 50 mL (2 g Intravenous New Bag/Given 11/08/18 1525)  potassium chloride 10 mEq in 100 mL IVPB (10 mEq Intravenous New Bag/Given 11/08/18 1523)  potassium chloride SA (K-DUR) CR tablet 40 mEq (40 mEq Oral Given 11/08/18 1517)     Initial Impression / Assessment and Plan / ED Course  I have reviewed the triage vital signs and the nursing notes.  Pertinent labs & imaging results that were available during my care of the patient were reviewed by me and considered in my medical decision making (see chart for details).        Presenting for evaluation of slurred speech, although this is difficult to assess in the setting of cleft palate and baseline speech abnormalities.  On exam, patient without other focal deficit.  She is alert and oriented.  Additionally, patient reporting worsening peripheral swelling and increase of her Lasix.  As patient is outside any window for acute stroke intervention, will order stroke labs and imaging but will not call code stroke.   EKG shows prolonged QT.  X-ray viewed interpreted by me no pneumonia, pneumothorax, effusion.  CT head negative.  Labs show critically low potassium at less than 2.  Oral and IV potassium started.  Mag started.  Case discussed with attending, Dr. Zenia Resides evaluated the patient.   Will call for admission due to hypokalemia.  Discussed with Dr. Lorin Mercy from [facility] hospital[facility] service, patient to be admitted.  Final Clinical Impressions(s) / ED Diagnoses   Final diagnoses:  Hypokalemia    ED Discharge Orders    None       Franchot Heidelberg, PA-C 11/08/18 1605    Lacretia Leigh, MD 11/09/18 1534

## 2018-11-08 NOTE — ED Notes (Signed)
Patient transported to X-ray 

## 2018-11-08 NOTE — H&P (Signed)
History and Physical    Rhonda [name] ZGY:[id number] DOB: June 06, 1950 DOA: 11/08/2018  PCP: Maurice Small, MD Consultants:  McLean/Bensimhon - cardiology; Elisha Ponder - podiatry; Cruzita Lederer - endocrinology; neurology Patient coming from:   Home - lives with husband; NOK: Rhonda [name] -husband- [phone]  Chief Complaint: Slurred speech and dizziness  HPI: Rhonda [name] is a 68 y.o. female with medical history significant of hypothyroidism; HTN; HLD; DM; chronic systolic CHF with AICD; cleft palate; and stage 1 breast cancer s/p lumpectomy/chemo/rads (2012) presenting with slurred speech and dizziness.  She has a cleft palate and she doesn't have her dentures in.  "Now that they say that my potassium is low, I understand why I've been laying around."  She has been tired and weak.  She felt like she was in a daze, "like an out of body thing."  She has been falling a lot - last year, it was so many times but this year only 2 times.  Her legs have been hurting; they are wrapped in Unna boots "and I just want to cut off my legs, they are so uncomfortable".  She is due to have them rewrapped tomorrow.   I have spoken with her husband.  He reports that she was "loopy" and confused and weak.  He was concerned about a stroke.  He does note fluid retention and LE edema and so they increased her fluid pills.  ED Course:  K< 2.0.  Worsening recent HF, doubled Lasix dose recently.  Neuro exam normal other than slurred speech, CT normal.  Giving K+ and Mag++.  Review of Systems: As per HPI; otherwise review of systems reviewed and negative.   Ambulatory Status:  Ambulates without assistance or a cane with longer distances  Past Medical History:  Diagnosis Date   Anemia    Anxiety    Arthritis    RIGHT KNEE ARTHRITIS AND PAIN-PT TOLD BONE ON BONE   Asthma    Back pain    DISK PROBLEM   Breast cancer, stage 1 ([place]) 03/26/2011   left-FINISHED CHEMO  AND RADIATION   Cardiomyopathy    PT'S CARDIOLOGIST  IS DR. Tressia Miners TURNER   Chronic systolic heart failure (HCC)    a) NICM b) ECHO (03/2013) EF 20-25% c) ECHO (09/2013) EF 45-50%, grade I DD   Complication of anesthesia    PT STATES HER B/P LOW AFTER ONE OF HER SURGERIES--SHE ATTRIBUTES TO LYING FLAT   Depression    Diabetes mellitus    "diet controlled" (05/04/2103)   Exertional shortness of breath    GERD (gastroesophageal reflux disease)    Gout    Heart murmur    Hyperlipidemia    Hypertension    Hypothyroidism    Left bundle branch block    s/p CRT-D (04/2013)   Migraines    On home oxygen therapy    "2L suppose to be q night" (05/03/2013)   Peripheral neuropathy    feet   SVD (spontaneous vaginal delivery)    x 2   Unspecified vitamin D deficiency 03/26/2011   does not take meds    Past Surgical History:  Procedure Laterality Date   BI-VENTRICULAR IMPLANTABLE CARDIOVERTER DEFIBRILLATOR N/A 05/03/2013   Procedure: BI-VENTRICULAR IMPLANTABLE CARDIOVERTER DEFIBRILLATOR  (CRT-D);  Surgeon: Evans Lance, MD;  Location: [facility] CATH LAB;  Service: Cardiovascular;  Laterality: N/A;   BI-VENTRICULAR IMPLANTABLE CARDIOVERTER DEFIBRILLATOR  (CRT-D)  05/03/2013   MDT CRTD implanted by Dr Lovena Le for non ischemic cardiomyopathy   BIOPSY  05/31/2018   Procedure: BIOPSY;  Surgeon: Ronnette Juniper, MD;  Location: [facility] ENDOSCOPY;  Service: Gastroenterology;;   BREAST LUMPECTOMY Left 07/28/2010   CARDIAC CATHETERIZATION  2010   NORMAL CORONARY ARTERIES   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--11 SURGERIES     PT HAS REMOVABLE SPEECH BULB-TAKES IT OUT BEFORE HER SURGERY   COLONOSCOPY     COLONOSCOPY N/A 05/31/2018   Procedure: COLONOSCOPY;  Surgeon: Ronnette Juniper, MD;  Location: [facility];  Service: Gastroenterology;  Laterality: N/A;   HYSTEROSCOPY W/D&C  01/20/2012   Procedure: DILATATION AND CURETTAGE /HYSTEROSCOPY;  Surgeon: Margarette Asal, MD;  Location: [facility];  Service: Gynecology;  Laterality: N/A;  with trueclear    PORT-A-CATH REMOVAL  09/23/2011   Procedure: REMOVAL PORT-A-CATH;  Surgeon: Rolm Bookbinder, MD;  Location: [facility];  Service: General;  Laterality: N/A;  Port Removal   PORTACATH PLACEMENT  2012   TONSILLECTOMY  1960's   TOTAL KNEE ARTHROPLASTY  05/10/2012   Procedure: TOTAL KNEE ARTHROPLASTY;  Surgeon: Mauri Pole, MD;  Location: [facility];  Service: Orthopedics;  Laterality: Right;   WISDOM TOOTH EXTRACTION      Social History   Socioeconomic History   Marital status: Married    Spouse name: Not on file   Number of children: 2   Years of education: masters   Highest education level: Not on file  Occupational History   Not on file  Social Needs   Financial resource strain: Not on file   Food insecurity    Worry: Not on file    Inability: Not on file   Transportation needs    Medical: Not on file    Non-medical: Not on file  Tobacco Use   Smoking status: Former Smoker    Packs/day: 0.10    Years: 26.00    Pack years: 2.60    Types: Cigarettes    Quit date: 05/06/2007    Years since quitting: 11.5   Smokeless tobacco: Never Used  Substance and Sexual Activity   Alcohol use: No   Drug use: No   Sexual activity: Yes  Lifestyle   Physical activity    Days per week: Not on file    Minutes per session: Not on file   Stress: Not on file  Relationships   Social connections    Talks on phone: Not on file    Gets together: Not on file    Attends religious service: Not on file    Active member of club or organization: Not on file    Attends meetings of clubs or organizations: Not on file    Relationship status: Not on file   Intimate partner violence    Fear of current or ex partner: Not on file    Emotionally abused: Not on file    Physically abused: Not on file    Forced sexual activity: Not on file  Other Topics Concern   Not on file  Social History Narrative   Tobacco Use Cigarettes: Former Smoker, Quit in 2008   No Alcohol   No  recreational drug use   Diet: Regular/Low Carb   Exercise: None   Occupation: disabled   Education: Research officer, political party, masters   Children: 2   Firearms: No   Therapist, art Use: Always   Former Metallurgist.        Allergies  Allergen Reactions   Ceftin Anaphylaxis    Face and throat swell    Shellfish Allergy Other (See Comments)  Gout exacerbation   Geodon [Ziprasidone Hcl] Hives   Lisinopril Other (See Comments)    angioedema   Allopurinol Nausea Only and Other (See Comments)    weakness   Ativan [Lorazepam] Itching   Sulfa Antibiotics Itching   Ultram [Tramadol Hcl] Itching    Family History  Problem Relation Age of Onset   Alzheimer's disease Mother    CVA Mother    Hypertension Mother    Cancer - Colon Father        late 77   Birth defects Paternal Uncle     Prior to Admission medications   Medication Sig Start Date End Date Taking? Authorizing Provider  acetaZOLAMIDE (DIAMOX) 125 MG tablet TAKE 1 TABLET(125 MG) BY MOUTH DAILY 10/06/18   [name], Donika K, DO  buPROPion (WELLBUTRIN XL) 150 MG 24 hr tablet Take 450 mg by mouth every morning.     [provider]  carboxymethylcellulose (REFRESH PLUS) 0.5 % SOLN Place 1 drop into both eyes 3 (three) times daily as needed (dry eyes).    [provider]  carvedilol (COREG) 3.125 MG tablet Take 3.125 mg by mouth 2 (two) times daily with a meal.    [provider]  citalopram (CELEXA) 20 MG tablet Take 20 mg by mouth daily.    [provider]  clindamycin (CLEOCIN) 300 MG capsule Take 600 mg by mouth See admin instructions. Take 2 capsules (600 mg) by mouth 1 hour prior to dental procedure.    [provider]  clonazePAM (KLONOPIN) 0.5 MG tablet Take 0.5 mg by mouth 3 (three) times daily as needed for anxiety.     [provider]  colestipol (COLESTID) 1 g tablet Take by mouth 2 (two) times daily.     [provider]  fexofenadine (ALLEGRA) 180 MG tablet  Take 180 mg by mouth 2 (two) times daily.    [provider]  gabapentin (NEURONTIN) 300 MG capsule Take 300 mg by mouth 3 (three) times daily.     [provider]  gabapentin (NEURONTIN) 800 MG tablet Take 800 mg by mouth at bedtime as needed (neuropathy).     [provider]  ipratropium (ATROVENT) 0.03 % nasal spray Place 2 sprays into both nostrils 2 (two) times daily.    [provider]  levothyroxine (SYNTHROID, LEVOTHROID) 100 MCG tablet TAKE 1 TABLET(100 MCG) BY MOUTH DAILY 06/10/18   Philemon Kingdom, MD  metolazone (ZAROXOLYN) 2.5 MG tablet Take only as directed by CHF clinic 11/02/18   Clegg, Amy D, NP  midodrine (PROAMATINE) 10 MG tablet Take 1 tablet (10 mg total) by mouth 3 (three) times daily with meals. 07/01/18   Patel, Donika K, DO  montelukast (SINGULAIR) 10 MG tablet Take 10 mg by mouth at bedtime.  09/28/16   [provider]  omeprazole (PRILOSEC) 40 MG capsule TK 1 C PO QD 30 MIN B BRE FOR 7 DAYS 09/28/18   [provider]  ondansetron (ZOFRAN) 8 MG tablet TK 1 T PO BID FOR 10 DAYS PRF NAUSEA 09/28/18   [provider]  polyethylene glycol (MIRALAX / GLYCOLAX) packet Take 17 g by mouth daily as needed for moderate constipation.    [provider]  polyethylene glycol powder (GLYCOLAX/MIRALAX) 17 GM/SCOOP powder DISSOLVE 1 CAPFUL IN 8 OUNCES OF LIQUID AND TK PO ONCE A DAY UTD 08/18/18   [provider]  potassium chloride SA (K-DUR,KLOR-CON) 20 MEQ tablet Take 3 tablets (60 mEq total) by mouth 2 (two) times  daily. Patient not taking: Reported on 11/02/2018 10/29/17   Darrick Grinder D, NP  probenecid (BENEMID) 500 MG tablet TK 1 T PO BID 09/13/18   [provider]  simvastatin (ZOCOR) 10 MG tablet Take 10 mg by mouth every evening.     [provider]  spironolactone (ALDACTONE) 25 MG tablet Take 0.5 tablets (12.5 mg total) by mouth every evening. 08/05/18   Shirley Friar, PA-C  torsemide  (DEMADEX) 20 MG tablet Take 60 mg by mouth daily.    [provider]  zolpidem (AMBIEN) 10 MG tablet Take 10 mg by mouth at bedtime as needed for sleep. For sleep     [provider]    Physical Exam: Vitals:   11/08/18 1303 11/08/18 1308 11/08/18 1515 11/08/18 1745  BP: (!) 149/70  (!) 147/83 (!) 131/51  Pulse:   72   Resp: 18  13 15   Temp:  99 F (37.2 C)    TempSrc:  Oral    SpO2:   100%       General:  Appears calm and comfortable and is NAD  Eyes:  PERRL, EOMI, normal lids, iris  ENT:  grossly normal hearing, tongue, mmm; edentulous  Neck:  no LAD, masses or thyromegaly  Cardiovascular:  RRR, no m/r/g. No LE edema.   Respiratory:   CTA bilaterally with no wheezes/rales/rhonchi.  Normal respiratory effort.  Abdomen:  soft, NT, ND, NABS  Skin:  no rash or induration seen on limited exam  Musculoskeletal:  grossly normal tone BUE/BLE, good ROM, no bony abnormality  Psychiatric:  grossly normal mood and affect, speech fluent and appropriate with chronic dysarthria from cleft palate, exacerbated by lack of dentures, AOx3  Neurologic:  CN 2-12 grossly intact with chronic dysarthria from cleft palate, moves all extremities in coordinated fashion, sensation intact    Radiological Exams on Admission: Dg Chest 2 View  Result Date: 11/08/2018 CLINICAL DATA:  Peripheral edema. Mental status changes. EXAM: CHEST - 2 VIEW COMPARISON:  06/21/2017 FINDINGS: Lower thoracic spondylosis. Similar appearance of pacer/AICD device. Surgical clips project over the left breast. Midline trachea. Normal heart size. Tortuous thoracic aorta. Atherosclerosis in the transverse aorta. No pleural effusion or pneumothorax. Clear lungs. IMPRESSION: No acute cardiopulmonary disease. Aortic Atherosclerosis (ICD10-I70.0). Electronically Signed   By: Abigail Miyamoto M.D.   On: 11/08/2018 14:03   Ct Head Wo Contrast  Result Date: 11/08/2018 CLINICAL DATA:  Slurred speech and dizziness  today.  Headache. EXAM: CT HEAD WITHOUT CONTRAST TECHNIQUE: Contiguous axial images were obtained from the base of the skull through the vertex without intravenous contrast. COMPARISON:  CT head 08/10/2018 FINDINGS: Brain: Ventricle size normal. Negative for infarct, hemorrhage, mass. Extensive ossification of the falx. Vascular: Negative for hyperdense vessel Skull: No acute abnormality. Sinuses/Orbits: Negative Other: None IMPRESSION: Negative CT head Electronically Signed   By: Franchot Gallo M.D.   On: 11/08/2018 15:16    EKG: Independently reviewed.  NSR with rate 79; prolonged QTc 526; RBBB, LAFB; LVH; no evidence of acute ischemia   Labs on Admission: I have personally reviewed the available labs and imaging studies at the time of the admission.  Pertinent labs:   K+ <2.0 CO2 33 BUN 25/Creatinine 1.75/GFR 35; 15/1.44/44 on 6/4 - baseline 1.5-1.8 AP 152 Albumin 3.3 AST 46/ALT 29 BNP 95.5 CBC essentially normal INR 1.2 UA: small LE, rare bacteria ETOH <10 UDS negative   Assessment/Plan Principal Problem:   Hypokalemia Active Problems:   Breast cancer, stage 1 ([place])  On home oxygen therapy   Hypothyroidism   Hyperlipidemia   Orthostatic hypotension   CKD (chronic kidney disease) stage 3, GFR 30-59 ml/min (HCC)   Hypokalemia -Repleted in ER with 40 mEq KCl x 1 PO and 10 mEq IV for a total of 50 mEq; this would be expected to bring her K+ up to about 2.5. -Will give an additional 40 mEq PO now and at 0500 and recheck BMP at 0800 tomorrow.   -Mag was also supplemented. -Will check Mag in AM. -Suspect that this is the cause of her disorientation and weakness, in the setting of background cognitive impairment  -Hold spironolactone for now but continue Demadex  Orthostatic hypotension -On Midodrine but also Coreg -Also with elevated ICP, on Diamox -Sees neurology for this   Stage 3 CKD -Appears to be stable at this time  Hypothyroidism -Check TSH -Continue  Synthroid at current dose for now  HLD -Continue Zocor  Breast CA -Remote, without current evidence of recurrence  Chronic respiratory failure 2* to CHF -s/p AICD -Most recent echo in 2/20 with EF 55-60% -Last seen by Dr. Haroldine Laws in 2/20 -Continue Demadex, hold spironolactone -Continue Coreg -Would be nice to add ACE if able  Stasis -Wound care consult for Unna boot change -PT consult for frequent falls   Note: This patient has been tested and is pending for the novel coronavirus COVID-19.  DVT prophylaxis:  Lovenox Code Status:  Full - confirmed with patient Family Communication: None present; I spoke with her husband by telephone Disposition Plan:  Home once clinically improved Consults called: PT Admission status: It is my clinical opinion that referral for OBSERVATION is reasonable and necessary in this patient based on the above information provided. The aforementioned taken together are felt to place the patient at high risk for further clinical deterioration. However it is anticipated that the patient may be medically stable for discharge from the hospital within 24 to 48 hours.    Karmen Bongo MD [facility]   How to contact the [facility] Attending or Consulting provider [place] or covering provider during after hours [place], for this patient?  1. Check the care team in [facility] and look for a) attending/consulting [facility] provider listed and b) the [facility] team listed 2. Log into [email or web address] and use [place]'s universal password to access. If you do not have the password, please contact the hospital operator. 3. Locate the [facility] provider you are looking for under [facility] and page to a number that you can be directly reached. 4. If you still have difficulty reaching the provider, please page the [facility] (Director on Call) for the Hospitalists listed on amion for assistance.   11/08/2018, 6:01 PM

## 2018-11-08 NOTE — ED Notes (Signed)
Patient transported to CT 

## 2018-11-09 ENCOUNTER — Encounter (HOSPITAL_COMMUNITY): Payer: Medicare HMO

## 2018-11-09 DIAGNOSIS — I5022 Chronic systolic (congestive) heart failure: Secondary | ICD-10-CM | POA: Diagnosis present

## 2018-11-09 DIAGNOSIS — K219 Gastro-esophageal reflux disease without esophagitis: Secondary | ICD-10-CM | POA: Diagnosis present

## 2018-11-09 DIAGNOSIS — Z20828 Contact with and (suspected) exposure to other viral communicable diseases: Secondary | ICD-10-CM | POA: Diagnosis present

## 2018-11-09 DIAGNOSIS — E669 Obesity, unspecified: Secondary | ICD-10-CM | POA: Diagnosis present

## 2018-11-09 DIAGNOSIS — F329 Major depressive disorder, single episode, unspecified: Secondary | ICD-10-CM | POA: Diagnosis present

## 2018-11-09 DIAGNOSIS — J9610 Chronic respiratory failure, unspecified whether with hypoxia or hypercapnia: Secondary | ICD-10-CM | POA: Diagnosis present

## 2018-11-09 DIAGNOSIS — E039 Hypothyroidism, unspecified: Secondary | ICD-10-CM | POA: Diagnosis present

## 2018-11-09 DIAGNOSIS — I951 Orthostatic hypotension: Secondary | ICD-10-CM | POA: Diagnosis present

## 2018-11-09 DIAGNOSIS — E1142 Type 2 diabetes mellitus with diabetic polyneuropathy: Secondary | ICD-10-CM | POA: Diagnosis present

## 2018-11-09 DIAGNOSIS — I4581 Long QT syndrome: Secondary | ICD-10-CM | POA: Diagnosis present

## 2018-11-09 DIAGNOSIS — I872 Venous insufficiency (chronic) (peripheral): Secondary | ICD-10-CM | POA: Diagnosis present

## 2018-11-09 DIAGNOSIS — E876 Hypokalemia: Secondary | ICD-10-CM | POA: Diagnosis present

## 2018-11-09 DIAGNOSIS — I452 Bifascicular block: Secondary | ICD-10-CM | POA: Diagnosis present

## 2018-11-09 DIAGNOSIS — R4781 Slurred speech: Secondary | ICD-10-CM | POA: Diagnosis present

## 2018-11-09 DIAGNOSIS — E1122 Type 2 diabetes mellitus with diabetic chronic kidney disease: Secondary | ICD-10-CM | POA: Diagnosis present

## 2018-11-09 DIAGNOSIS — I130 Hypertensive heart and chronic kidney disease with heart failure and stage 1 through stage 4 chronic kidney disease, or unspecified chronic kidney disease: Secondary | ICD-10-CM | POA: Diagnosis present

## 2018-11-09 DIAGNOSIS — N183 Chronic kidney disease, stage 3 (moderate): Secondary | ICD-10-CM

## 2018-11-09 DIAGNOSIS — M1711 Unilateral primary osteoarthritis, right knee: Secondary | ICD-10-CM | POA: Diagnosis present

## 2018-11-09 DIAGNOSIS — R42 Dizziness and giddiness: Secondary | ICD-10-CM | POA: Diagnosis present

## 2018-11-09 DIAGNOSIS — Z9981 Dependence on supplemental oxygen: Secondary | ICD-10-CM | POA: Diagnosis not present

## 2018-11-09 DIAGNOSIS — E785 Hyperlipidemia, unspecified: Secondary | ICD-10-CM | POA: Diagnosis present

## 2018-11-09 DIAGNOSIS — I428 Other cardiomyopathies: Secondary | ICD-10-CM | POA: Diagnosis present

## 2018-11-09 DIAGNOSIS — Q359 Cleft palate, unspecified: Secondary | ICD-10-CM | POA: Diagnosis not present

## 2018-11-09 DIAGNOSIS — F419 Anxiety disorder, unspecified: Secondary | ICD-10-CM | POA: Diagnosis present

## 2018-11-09 DIAGNOSIS — R471 Dysarthria and anarthria: Secondary | ICD-10-CM | POA: Diagnosis present

## 2018-11-09 LAB — CBC
HCT: 37 % (ref 36.0–46.0)
Hemoglobin: 12.2 g/dL (ref 12.0–15.0)
MCH: 32.6 pg (ref 26.0–34.0)
MCHC: 33 g/dL (ref 30.0–36.0)
MCV: 98.9 fL (ref 80.0–100.0)
Platelets: 300 10*3/uL (ref 150–400)
RBC: 3.74 MIL/uL — ABNORMAL LOW (ref 3.87–5.11)
RDW: 13.6 % (ref 11.5–15.5)
WBC: 6.4 10*3/uL (ref 4.0–10.5)
nRBC: 0 % (ref 0.0–0.2)

## 2018-11-09 LAB — BASIC METABOLIC PANEL
Anion gap: 10 (ref 5–15)
BUN: 22 mg/dL (ref 8–23)
CO2: 31 mmol/L (ref 22–32)
Calcium: 8.8 mg/dL — ABNORMAL LOW (ref 8.9–10.3)
Chloride: 101 mmol/L (ref 98–111)
Creatinine, Ser: 1.53 mg/dL — ABNORMAL HIGH (ref 0.44–1.00)
GFR calc Af Amer: 41 mL/min — ABNORMAL LOW (ref >60)
GFR calc non Af Amer: 35 mL/min — ABNORMAL LOW (ref >60)
Glucose, Bld: 158 mg/dL — ABNORMAL HIGH (ref 70–99)
Potassium: 2.9 mmol/L — ABNORMAL LOW (ref 3.5–5.1)
Sodium: 142 mmol/L (ref 135–145)

## 2018-11-09 LAB — MAGNESIUM: Magnesium: 2.4 mg/dL (ref 1.7–2.4)

## 2018-11-09 LAB — NOVEL CORONAVIRUS, NAA (HOSP ORDER, SEND-OUT TO REF LAB; TAT 18-24 HRS): SARS-CoV-2, NAA: NOT DETECTED

## 2018-11-09 LAB — HIV ANTIBODY (ROUTINE TESTING W REFLEX): HIV Screen 4th Generation wRfx: NONREACTIVE

## 2018-11-09 MED ORDER — POTASSIUM CHLORIDE CRYS ER 20 MEQ PO TBCR
40.0000 meq | EXTENDED_RELEASE_TABLET | ORAL | Status: AC
  Administered 2018-11-09 (×2): 40 meq via ORAL
  Filled 2018-11-09 (×2): qty 2

## 2018-11-09 NOTE — Progress Notes (Signed)
Orthopedic Tech Progress Note Patient Details:  [name] 20-Mar-1951 [id number] Washed patients legs, pat dry and added a little lotion to the skin then applied the unna boots Ortho Devices Type of Ortho Device: Haematologist Ortho Device/Splint Location: bilateral Ortho Device/Splint Interventions: Adjustment, Application, Ordered   Post Interventions Patient Tolerated: Well Instructions Provided: Care of device, Adjustment of device   Janit Pagan 11/09/2018, 10:42 AM

## 2018-11-09 NOTE — Progress Notes (Addendum)
[facility] PROGRESS NOTE  Rhonda [name] JKK:[id number] DOB: 1950-10-22 DOA: 11/08/2018 PCP: Maurice Small, MD  Assessment/Plan:  Hypokalemia. Likely related to medication adjustments of late. Chart review indicates potassium supplements stopped by Paramed[facility] 10/28/18. Today potassium level increased to 2.9 from <2.0. mag level within limits of normal. Home meds include diamox, demadex, spironolactone, torsemide and potassium.  -Will give an additional 40 mEq PO now and in 4 hours  and recheck BMP at 0800 tomorrow.     Orthostatic hypotension. Long hx of same. Sees neurology for this -continue Midodrine -continue Coreg -continue Diamox    Stage 3 CKD creatinine 1.53. this appears to be close to baseline. -monitor   Hypothyroidism TSH 0.168.  -Continue Synthroid at current dose for now- defer to outpatient PCP   HLD -Continue Zocor   Breast CA -Remote, without current evidence of recurrence   Chronic respiratory failure 2* to CHF. s/p AICD Most recent echo in 2/20 with EF 55-60%. Last seen by NP 2 weeks ago.  -Continue Demadex, hold spironolactone -Continue Coreg   Stasis -Wound care consult for Unna boot change -unna boot changed today -PT consult for frequent falls  Code Status: full Family Communication: patient Disposition Plan: home hopefully tomorrow   Consultants:   Procedures:   Antibiotics:   HPI/Subjective: 68 yo hx obesity, cleft palate, breast cancer, chronic respiratory failure, chronic systolic heart failure admitted for hypokalemia after potassium discontinued via paramedicine 2 weeks ago.   Objective: Vitals:   11/08/18 2008 11/09/18 0623  BP: (!) 126/99 (!) 105/52  Pulse: 79 79  Resp: 17 18  Temp: 98.4 F (36.9 C) 98.4 F (36.9 C)  SpO2: 100% 99%    Intake/Output Summary (Last 24 hours) at 11/09/2018 1200 Last data filed at 11/09/2018 0538 Gross per 24 hour  Intake 240 ml  Output --  Net 240 ml   There were no vitals filed for  this visit.  Exam:  General:  Obese sitting up in bed watching TV. No acute distress Cardiovascular: rrr no mgr bilateral unna boots Respiratory: normal effort BS clear bilaterally no wheeze Abdomen: obese +BS soft no guarding or rebounding Musculoskeletal: joints without swelling/erythema  Data Reviewed: Basic Metabolic Panel: Recent Labs  Lab 11/08/18 1400 11/09/18 0842  NA 141 142  K <2.0* 2.9*  CL 93* 101  CO2 33* 31  GLUCOSE 99 158*  BUN 25* 22  CREATININE 1.75* 1.53*  CALCIUM 8.8* 8.8*  MG  --  2.4   Liver Function Tests: Recent Labs  Lab 11/08/18 1400  AST 46*  ALT 29  ALKPHOS 152*  BILITOT 0.6  PROT 7.7  ALBUMIN 3.3*   No results for input(s): LIPASE, AMYLASE in the last 168 hours. No results for input(s): AMMONIA in the last 168 hours. CBC: Recent Labs  Lab 11/08/18 1400 11/09/18 0842  WBC 7.2 6.4  NEUTROABS 4.8  --   HGB 12.1 12.2  HCT 36.6 37.0  MCV 100.5* 98.9  PLT 274 300   Cardiac Enzymes: No results for input(s): CKTOTAL, CKMB, CKMBINDEX, TROPONINI in the last 168 hours. BNP (last 3 results) Recent Labs    11/08/18 1400  BNP 95.5    ProBNP (last 3 results) No results for input(s): PROBNP in the last 8760 hours.  CBG: No results for input(s): GLUCAP in the last 168 hours.  Recent Results (from the past 240 hour(s))  Novel Coronavirus,NAA,(SEND-OUT TO REF LAB - TAT 24-48 hrs); Hosp Order     Status: None   Collection  Time: 11/08/18  3:52 PM   Specimen: Nasopharyngeal Swab; Respiratory  Result Value Ref Range Status   SARS-CoV-2, NAA NOT DETECTED NOT DETECTED Final    Comment: (NOTE) This test was developed and its performance characteristics determined by [employer]. This test has not been FDA cleared or approved. This test has been authorized by FDA under an Emergency Use Authorization (EUA). This test is only authorized for the duration of time the declaration that circumstances exist justifying the authorization  of the emergency use of in vitro diagnostic tests for detection of SARS-CoV-2 virus and/or diagnosis of COVID-19 infection under section 564(b)(1) of the Act, 21 U.S.C. 300PQZ-3(A)(0), unless the authorization is terminated or revoked sooner. When diagnostic testing is negative, the possibility of a false negative result should be considered in the context of a patient's recent exposures and the presence of clinical signs and symptoms consistent with COVID-19. An individual without symptoms of COVID-19 and who is not shedding SARS-CoV-2 virus would expect to have a negative (not detected) result in this assay. Performed  At: [facility] [address] [place], [place] [id number] Rush Farmer MD KT:[id number]    [place]  Final    Comment: Performed at [facility], [place] [address], [place], [place] [address]     Studies: Dg Chest 2 View  Result Date: 11/08/2018 CLINICAL DATA:  Peripheral edema. Mental status changes. EXAM: CHEST - 2 VIEW COMPARISON:  06/21/2017 FINDINGS: Lower thoracic spondylosis. Similar appearance of pacer/AICD device. Surgical clips project over the left breast. Midline trachea. Normal heart size. Tortuous thoracic aorta. Atherosclerosis in the transverse aorta. No pleural effusion or pneumothorax. Clear lungs. IMPRESSION: No acute cardiopulmonary disease. Aortic Atherosclerosis (ICD10-I70.0). Electronically Signed   By: Abigail Miyamoto M.D.   On: 11/08/2018 14:03   Ct Head Wo Contrast  Result Date: 11/08/2018 CLINICAL DATA:  Slurred speech and dizziness today.  Headache. EXAM: CT HEAD WITHOUT CONTRAST TECHNIQUE: Contiguous axial images were obtained from the base of the skull through the vertex without intravenous contrast. COMPARISON:  CT head 08/10/2018 FINDINGS: Brain: Ventricle size normal. Negative for infarct, hemorrhage, mass. Extensive ossification of the falx. Vascular: Negative for hyperdense vessel Skull: No acute  abnormality. Sinuses/Orbits: Negative Other: None IMPRESSION: Negative CT head Electronically Signed   By: Franchot Gallo M.D.   On: 11/08/2018 15:16    Scheduled Meds:  buPROPion  450 mg Oral q morning - 10a   carvedilol  3.125 mg Oral BID WC   citalopram  20 mg Oral Daily   colestipol  2 g Oral Daily   docusate sodium  100 mg Oral BID   enoxaparin (LOVENOX) injection  40 mg Subcutaneous Q24H   gabapentin  300 mg Oral TID   ipratropium  2 spray Each Nare BID   levothyroxine  100 mcg Oral Q0600   loratadine  10 mg Oral Daily   midodrine  10 mg Oral TID WC   montelukast  10 mg Oral QHS   potassium chloride  40 mEq Oral BID   potassium chloride  40 mEq Oral Q4H   simvastatin  10 mg Oral QPM   sodium chloride flush  3 mL Intravenous Q12H   torsemide  60 mg Oral Daily   Continuous Infusions:   Principal Problem:   Hypokalemia Active Problems:   On home oxygen therapy   Orthostatic hypotension   CKD (chronic kidney disease) stage 3, GFR 30-59 ml/min (HCC)   Breast cancer, stage 1 (HCC)   Hypothyroidism  Hyperlipidemia    Time spent: 81 minutes    Radene Gunning NP  [facility]  If 7PM-7AM, please contact night-coverage at [email or web address], password [facility] 11/09/2018, 12:00 PM  LOS: 0 days

## 2018-11-09 NOTE — Evaluation (Signed)
Physical Therapy Evaluation Patient Details Name: Rhonda [name] MRN: [id number] DOB: 05/07/1951 Today's Date: 11/09/2018   History of Present Illness  Patient is [age] year old female who reports she has felt " loopy " for the past 2-3 weeks. She has a hhistory of falls which have increased over the pazst few weeks. She reports sometimes her legs give out. She was admitted with hyoalkemia. PMH: vitamin D deficiciany, peripheral neuropathy, Migranes, home o2, HTN, gout, hyperlipidemia, Depression, chornic heart failure, cleft pallete, breast cacer, GERD, Gout, anxiety, arthritis , asthma , Pacemaker   Clinical Impression  Patient ambulated without a device but she had a loss of balance. She is a high fall risk. Therapy advised that she at least use her cane but she would likely be safest with a rolling walker. She would also benefit from home health therapy for a home safety eval and endurance training.     Follow Up Recommendations Home health PT;Outpatient PT    Equipment Recommendations  Rolling walker with 5" wheels    Recommendations for Other Services       Precautions / Restrictions Precautions Precautions: Fall;ICD/Pacemaker Restrictions Weight Bearing Restrictions: No      Mobility  Bed Mobility Overal bed mobility: Needs Assistance Bed Mobility: Supine to Sit;Sit to Supine     Supine to sit: Supervision Sit to supine: Supervision   General bed mobility comments: supervision for safety in and our of the bed.   Transfers Overall transfer level: Needs assistance Equipment used: None Transfers: Sit to/from Stand Sit to Stand: Min guard         General transfer comment: min gaurd for intial balance   Ambulation/Gait Ambulation/Gait assistance: Min guard;Min assist Gait Distance (Feet): 100 Feet Assistive device: None Gait Pattern/deviations: Drifts right/left Gait velocity: decreased Gait velocity interpretation: <1.31 ft/sec, indicative of household  ambulator General Gait Details: Singificant drift left and right. At one point while patient was talking to the therapist she lost her balance and needed min a to correct   Stairs            Wheelchair Mobility    Modified Rankin (Stroke Patients Only) Modified Rankin (Stroke Patients Only) Pre-Morbid Rankin Score: Moderate disability Modified Rankin: Moderately severe disability     Balance Overall balance assessment: Needs assistance Sitting-balance support: No upper extremity supported;Feet supported Sitting balance-Leahy Scale: Good     Standing balance support: During functional activity Standing balance-Leahy Scale: Poor Standing balance comment: therapy had to correct balance at one point                              Pertinent Vitals/Pain Pain Assessment: No/denies pain    Home Living Family/patient expects to be discharged to:: Private residence Living Arrangements: Spouse/significant other Available Help at Discharge: Family Type of Home: House Home Access: Stairs to enter Entrance Stairs-Rails: Can reach both Entrance Stairs-Number of Steps: 5 Home Layout: One level        Prior Function Level of Independence: Independent with assistive device(s)         Comments: used a can but reported frequent falls. She reports she has continued to do her ADL's and IAD's but she has had a much harder time doing so      Hand Dominance   Dominant Hand: Right    Extremity/Trunk Assessment   Upper Extremity Assessment Upper Extremity Assessment: Overall WFL for tasks assessed    Lower Extremity Assessment  Lower Extremity Assessment: Overall WFL for tasks assessed    Cervical / Trunk Assessment Cervical / Trunk Assessment: Normal  Communication   Communication: No difficulties  Cognition Arousal/Alertness: Awake/alert Behavior During Therapy: WFL for tasks assessed/performed Overall Cognitive Status: Within Functional Limits for tasks  assessed                                 General Comments: Patient very pleasent       General Comments      Exercises     Assessment/Plan    PT Assessment Patient needs continued PT services  PT Problem List Decreased strength;Decreased activity tolerance;Decreased balance;Decreased mobility;Decreased coordination;Decreased knowledge of use of DME       PT Treatment Interventions DME instruction;Functional mobility training;Therapeutic activities;Therapeutic exercise;Neuromuscular re-education;Balance training;Patient/family education;Stair training;Gait training    PT Goals (Current goals can be found in the Care Plan section)  Acute Rehab PT Goals Patient Stated Goal: to fall less  PT Goal Formulation: With patient Time For Goal Achievement: 11/16/18 Potential to Achieve Goals: Good    Frequency Min 3X/week   Barriers to discharge        Co-evaluation               AM-PAC PT "6 Clicks" Mobility  Outcome Measure Help needed turning from your back to your side while in a flat bed without using bedrails?: None Help needed moving from lying on your back to sitting on the side of a flat bed without using bedrails?: A Little Help needed moving to and from a bed to a chair (including a wheelchair)?: A Little Help needed standing up from a chair using your arms (e.g., wheelchair or bedside chair)?: A Little Help needed to walk in hospital room?: A Lot Help needed climbing 3-5 steps with a railing? : A Lot 6 Click Score: 17    End of Session Equipment Utilized During Treatment: Gait belt Activity Tolerance: Patient tolerated treatment well Patient left: in bed;with call bell/phone within reach;with bed alarm set Nurse Communication: Mobility status PT Visit Diagnosis: Unsteadiness on feet (R26.81);Repeated falls (R29.6);Other abnormalities of gait and mobility (R26.89)    Time: 0940-1003 PT Time Calculation (min) (ACUTE ONLY): 23 min   Charges:    PT Evaluation $PT Eval Moderate Complexity: 1 Mod            Carney Living PT DPT  11/09/2018, 11:53 AM

## 2018-11-09 NOTE — Plan of Care (Signed)

## 2018-11-09 NOTE — Progress Notes (Signed)
Patient received to the unit via bed. Patient is alert and oriented x4. Vital signs are BP 126/99, pulse 79,temp 98.4 ,resp 17 and sp02 100% in room air. Iv in place. Patient is on telemetry and running paced rhythm. Skin assessment done with another nurse. Patient denies for the pain. Given schedule medicine as per order. Patient given instructions about call bell and phone. Bed in low position and call bell in reach.

## 2018-11-09 NOTE — Consult Note (Signed)
[place] Nurse wound consult note Reason for Consult:Patient wears Unna boots at home for management of edema and to facilitate venous flow.  Chronic erythema noted to bilateral lower legs. They are tender to touch, she reports.  Wound type:No open areas. Scarring from old lesions are noted Pressure Injury POA: NA Measurement:NA Wound bed:NA Drainage (amount, consistency, odor) None Periwound:Edema from knee to toes. Distal pedal pulses noted.  Dressing procedure/placement/frequency: Unna boots weekly on Wednesday. Will not follow at this time.  Please re-consult if needed.  Domenic Moras MSN, RN, FNP-BC CWON Wound, Ostomy, Continence Nurse Pager [phone]

## 2018-11-09 NOTE — Progress Notes (Signed)
VAST consulted to place PIV. Pt is not currently receiving IVF's or IV meds. Called unit and spoke with pt's nurse; she stated previous shift placed consult and she hasn't had a chance to review patient's chart. Advised if circumstances change and patient needs IV access to place IV consult.

## 2018-11-10 ENCOUNTER — Other Ambulatory Visit (HOSPITAL_COMMUNITY): Payer: Self-pay

## 2018-11-10 ENCOUNTER — Encounter (HOSPITAL_COMMUNITY): Payer: Medicare HMO

## 2018-11-10 LAB — BASIC METABOLIC PANEL
Anion gap: 10 (ref 5–15)
Anion gap: 12 (ref 5–15)
BUN: 25 mg/dL — ABNORMAL HIGH (ref 8–23)
BUN: 25 mg/dL — ABNORMAL HIGH (ref 8–23)
CO2: 30 mmol/L (ref 22–32)
CO2: 28 mmol/L (ref 22–32)
Calcium: 8.7 mg/dL — ABNORMAL LOW (ref 8.9–10.3)
Calcium: 9 mg/dL (ref 8.9–10.3)
Chloride: 101 mmol/L (ref 98–111)
Chloride: 98 mmol/L (ref 98–111)
Creatinine, Ser: 1.44 mg/dL — ABNORMAL HIGH (ref 0.44–1.00)
Creatinine, Ser: 1.21 mg/dL — ABNORMAL HIGH (ref 0.44–1.00)
GFR calc Af Amer: 44 mL/min — ABNORMAL LOW (ref >60)
GFR calc Af Amer: 54 mL/min — ABNORMAL LOW (ref >60)
GFR calc non Af Amer: 38 mL/min — ABNORMAL LOW (ref >60)
GFR calc non Af Amer: 47 mL/min — ABNORMAL LOW (ref >60)
Glucose, Bld: 93 mg/dL (ref 70–99)
Glucose, Bld: 146 mg/dL — ABNORMAL HIGH (ref 70–99)
Potassium: 3 mmol/L — ABNORMAL LOW (ref 3.5–5.1)
Potassium: 3.9 mmol/L (ref 3.5–5.1)
Sodium: 141 mmol/L (ref 135–145)
Sodium: 138 mmol/L (ref 135–145)

## 2018-11-10 MED ORDER — POTASSIUM CHLORIDE CRYS ER 20 MEQ PO TBCR: 40.0000 meq | EXTENDED_RELEASE_TABLET | Freq: Two times a day (BID) | ORAL | 11 refills | Status: DC

## 2018-11-10 MED ORDER — SPIRONOLACTONE 12.5 MG HALF TABLET
12.5000 mg | ORAL_TABLET | Freq: Every evening | ORAL | Status: DC
  Administered 2018-11-10: 12.5 mg via ORAL
  Filled 2018-11-10: qty 1

## 2018-11-10 MED ORDER — POTASSIUM CHLORIDE CRYS ER 20 MEQ PO TBCR: 40.0000 meq | EXTENDED_RELEASE_TABLET | ORAL | Status: DC

## 2018-11-10 MED ORDER — METOLAZONE 2.5 MG PO TABS: 2.5000 mg | ORAL_TABLET | Freq: Every day | ORAL | Status: DC | PRN

## 2018-11-10 MED ORDER — POTASSIUM CHLORIDE CRYS ER 20 MEQ PO TBCR
60.0000 meq | EXTENDED_RELEASE_TABLET | ORAL | Status: AC
  Administered 2018-11-10: 60 meq via ORAL
  Filled 2018-11-10 (×2): qty 3

## 2018-11-10 MED ORDER — POTASSIUM CHLORIDE CRYS ER 20 MEQ PO TBCR
40.0000 meq | EXTENDED_RELEASE_TABLET | Freq: Once | ORAL | Status: AC
  Administered 2018-11-10: 40 meq via ORAL

## 2018-11-10 NOTE — Progress Notes (Addendum)
Physical Therapy Treatment Patient Details Name: Rhonda [name] MRN: [id number] DOB: Oct 19, 1950 Today's Date: 11/10/2018    History of Present Illness Patient is a 68 year old female who reports she has felt " loopy " for the past 2-3 weeks. She has a history of falls which have increased over the past few weeks. She reports sometimes her legs give out. She was admitted with hypokalemia. PMH: vitamin D deficiancy, peripheral neuropathy, Migranes, home o2, HTN, gout, hyperlipidemia, Depression, chornic heart failure, cleft pallate, breast cancer, GERD, Gout, anxiety, arthritis , asthma , Pacemaker     PT Comments    Pt is requiring assistance at a supervision level.  She has a history of falls but did not want to use device today.  Based on her history of falls will recommend that she utilize this device in her home when she does not have support.  Pt continue to improve and was able to negotiate x3 stairs this am.  Continue to recommend HHPT at follow up with progression to OP PT.    Follow Up Recommendations  Home health PT;Outpatient PT     Equipment Recommendations  Rolling walker with 5" wheels    Recommendations for Other Services       Precautions / Restrictions Precautions Precautions: Fall;ICD/Pacemaker Restrictions Weight Bearing Restrictions: No    Mobility  Bed Mobility Overal bed mobility: Modified Independent Bed Mobility: Supine to Sit;Sit to Supine     Supine to sit: Modified independent (Device/Increase time) Sit to supine: Modified independent (Device/Increase time)      Transfers Overall transfer level: Modified independent Equipment used: None Transfers: Sit to/from Stand Sit to Stand: Modified independent (Device/Increase time)         General transfer comment: No assistance needed to transfer into standing and back to sitting.  Ambulation/Gait Ambulation/Gait assistance: Supervision Gait Distance (Feet): 160 Feet Assistive device: None Gait  Pattern/deviations: Drifts right/left     General Gait Details: No LOB noted, good cadence and safety awareness.   Stairs Stairs: Yes Stairs assistance: Supervision Stair Management: One rail Right Number of Stairs: 3 General stair comments: Cues for sequencing, safety and pacing.   Wheelchair Mobility    Modified Rankin (Stroke Patients Only)       Balance Overall balance assessment: Independent       Postural control: Posterior lean   Standing balance-Leahy Scale: Good                              Cognition Arousal/Alertness: Awake/alert Behavior During Therapy: WFL for tasks assessed/performed Overall Cognitive Status: Within Functional Limits for tasks assessed                                 General Comments: Patient very pleasent       Exercises      General Comments        Pertinent Vitals/Pain Pain Assessment: Faces Faces Pain Scale: Hurts little more Pain Location: B feet ( neuropathy ) Pain Descriptors / Indicators: Burning;Tingling Pain Intervention(s): Monitored during session;Repositioned    Home Living                      Prior Function            PT Goals (current goals can now be found in the care plan section) Acute Rehab PT Goals Patient Stated  Goal: to fall less  Potential to Achieve Goals: Good    Frequency    Min 3X/week      PT Plan      Co-evaluation              AM-PAC PT "6 Clicks" Mobility   Outcome Measure  Help needed turning from your back to your side while in a flat bed without using bedrails?: None Help needed moving from lying on your back to sitting on the side of a flat bed without using bedrails?: None Help needed moving to and from a bed to a chair (including a wheelchair)?: None Help needed standing up from a chair using your arms (e.g., wheelchair or bedside chair)?: None Help needed to walk in hospital room?: A Little Help needed climbing 3-5 steps  with a railing? : A Little 6 Click Score: 22    End of Session Equipment Utilized During Treatment: Gait belt Activity Tolerance: Patient tolerated treatment well Patient left: in bed;with call bell/phone within reach;with bed alarm set Nurse Communication: Mobility status PT Visit Diagnosis: Unsteadiness on feet (R26.81);Repeated falls (R29.6);Other abnormalities of gait and mobility (R26.89)     Time: 9935-7017 PT Time Calculation (min) (ACUTE ONLY): 14 min  Charges:  $Gait Training: 8-22 mins                     Governor Rooks, PTA Acute Rehabilitation Services Pager [phone] Office [phone]     Shafer Swamy Eli Hose 11/10/2018, 12:18 PM

## 2018-11-10 NOTE — Progress Notes (Signed)
Paramedicine Encounter    Patient ID: Rhonda [name], female    DOB: [birth year], 68 y.o.   MRN: [id number]    Patient Care Team: Maurice Small, MD as PCP - General (Family Medicine) [name], LCSW as Social Worker (Licensed Clinical Social Worker)  Patient Active Problem List   Diagnosis Date Noted  . Hypokalemia 11/08/2018  . CKD (chronic kidney disease) stage 3, GFR 30-59 ml/min (HCC) 11/08/2018  . Pain, dental 08/31/2018  . Orthostatic hypotension 07/28/2017  . SVD (spontaneous vaginal delivery)   . Peripheral neuropathy   . On home oxygen therapy   . Migraines   . Left bundle branch block   . Hypothyroidism   . Hypertension   . Hyperlipidemia   . Heart murmur   . GERD (gastroesophageal reflux disease)   . Exertional shortness of breath   . Depression   . Complication of anesthesia   . Back pain   . Arthritis   . Anxiety   . Anemia   . HTN (hypertension) 09/14/2013  . Chronic respiratory failure ([place]) 09/14/2013  . 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 Morbid obesity ([place]) 11/01/2012  . Chronic systolic heart failure ([place]) 10/27/2012  . S/P right TKA 05/10/2012  . Endometrial polyp 01/20/2012  . Breast cancer, stage 1 ([place]) 03/26/2011  . Unspecified vitamin D deficiency 03/26/2011    Current Outpatient Medications:  .  acetaZOLAMIDE (DIAMOX) 125 MG tablet, TAKE 1 TABLET(125 MG) BY MOUTH DAILY (Patient taking differently: Take 125 mg by mouth daily. ), Disp: 30 tablet, Rfl: 5 .  buPROPion (WELLBUTRIN XL) 150 MG 24 hr tablet, Take 450 mg by mouth every morning. , Disp: , Rfl:  .  carvedilol (COREG) 3.125 MG tablet, Take 3.125 mg by mouth 2 (two) times daily with a meal., Disp: , Rfl:  .  clonazePAM (KLONOPIN) 0.5 MG tablet, Take 0.5 mg by mouth 3 (three) times daily as needed for anxiety. , Disp: , Rfl:  .  colestipol (COLESTID) 1 g tablet, Take 2 g by mouth daily. , Disp: , Rfl:  .  fexofenadine (ALLEGRA) 180 MG tablet,  Take 180 mg by mouth 2 (two) times daily., Disp: , Rfl:  .  gabapentin (NEURONTIN) 300 MG capsule, Take 300 mg by mouth 3 (three) times daily. , Disp: , Rfl:  .  gabapentin (NEURONTIN) 800 MG tablet, Take 800 mg by mouth at bedtime as needed (neuropathy). , Disp: , Rfl:  .  ipratropium (ATROVENT) 0.03 % nasal spray, Place 2 sprays into both nostrils 2 (two) times daily., Disp: , Rfl:  .  levothyroxine (SYNTHROID, LEVOTHROID) 100 MCG tablet, TAKE 1 TABLET(100 MCG) BY MOUTH DAILY (Patient taking differently: Take 100 mcg by mouth daily. ), Disp: 45 tablet, Rfl: 3 .  midodrine (PROAMATINE) 10 MG tablet, Take 1 tablet (10 mg total) by mouth 3 (three) times daily with meals., Disp: 90 tablet, Rfl: 3 .  montelukast (SINGULAIR) 10 MG tablet, Take 10 mg by mouth at bedtime. , Disp: , Rfl: 1 .  simvastatin (ZOCOR) 10 MG tablet, Take 10 mg by mouth every evening. , Disp: , Rfl:  .  spironolactone (ALDACTONE) 25 MG tablet, Take 0.5 tablets (12.5 mg total) by mouth every evening., Disp: 45 tablet, Rfl: 1 .  torsemide (DEMADEX) 20 MG tablet, Take 60 mg by mouth daily., Disp: , Rfl:  .  zolpidem (AMBIEN) 10 MG tablet, Take 10 mg by mouth at bedtime as needed for sleep. For sleep , Disp: ,  Rfl:  .  carboxymethylcellulose (REFRESH PLUS) 0.5 % SOLN, Place 1 drop into both eyes 3 (three) times daily as needed (dry eyes)., Disp: , Rfl:  .  citalopram (CELEXA) 20 MG tablet, Take 20 mg by mouth daily., Disp: , Rfl:  .  metolazone (ZAROXOLYN) 2.5 MG tablet, Take 1 tablet (2.5 mg total) by mouth daily as needed (edema). Take only as directed by CHF clinic, Disp: , Rfl:  .  potassium chloride SA (K-DUR) 20 MEQ tablet, Take 2 tablets (40 mEq total) by mouth 2 (two) times daily., Disp: 180 tablet, Rfl: 11 Allergies  Allergen Reactions  . Ceftin Anaphylaxis    Face and throat swell   . Shellfish Allergy Other (See Comments)    Gout exacerbation  . Geodon [Ziprasidone Hcl] Hives  . Lisinopril Other (See Comments)     angioedema  . Allopurinol Nausea Only and Other (See Comments)    weakness  . Ativan [Lorazepam] Itching  . Sulfa Antibiotics Itching  . Ultram [Tramadol Hcl] Itching     Social History   Socioeconomic History  . Marital status: Married    Spouse name: Not on file  . Number of children: 2  . Years of education: masters  . Highest education level: Not on file  Occupational History  . Not on file  Social Needs  . Financial resource strain: Not on file  . Food insecurity    Worry: Not on file    Inability: Not on file  . Transportation needs    Medical: Not on file    Non-medical: Not on file  Tobacco Use  . Smoking status: Former Smoker    Packs/day: 0.10    Years: 26.00    Pack years: 2.60    Types: Cigarettes    Quit date: 05/06/2007    Years since quitting: 11.5  . Smokeless tobacco: Never Used  Substance and Sexual Activity  . Alcohol use: No  . Drug use: No  . Sexual activity: Yes  Lifestyle  . Physical activity    Days per week: Not on file    Minutes per session: Not on file  . Stress: Not on file  Relationships  . Social Herbalist on phone: Not on file    Gets together: Not on file    Attends religious service: Not on file    Active member of club or organization: Not on file    Attends meetings of clubs or organizations: Not on file    Relationship status: Not on file  . Intimate partner violence    Fear of current or ex partner: Not on file    Emotionally abused: Not on file    Physically abused: Not on file    Forced sexual activity: Not on file  Other Topics Concern  . Not on file  Social History Narrative   Tobacco Use Cigarettes: Former Smoker, Quit in 2008   No Alcohol   No recreational drug use   Diet: Regular/Low Carb   Exercise: None   Occupation: disabled   Education: Research officer, political party, masters   Children: 2   Firearms: No   Therapist, art Use: Always   Former Metallurgist.        Physical Exam      Future Appointments   Date Time Provider [place]  12/07/2018  7:50 AM Narda Amber K, DO [facility] None  12/08/2018 10:15 AM Philemon Kingdom, MD [facility] None  01/11/2019  1:30 PM Marzetta Board,  DPM [facility]  01/18/2019  7:25 AM [facility] DEVICE REMOTES [facility]     BP 110/80 (BP Location: Right Arm, Patient Position: Sitting)   Pulse 67   Temp 98.6 F (37 C)   Resp (!) 98   Weight yesterday-in hospital   ATF pt CAO x4 standing in the kitchen, Mr. Murfin called and stated that he just brought her home from the hospital. Pt was admitted Tuesday for confusion.  She stated that she feels a lot better and showed me her discharged instructions. She understands what she did to make herself sick and understands how to prevent the same reaction.  Pt denies sob, chest pain and dizziness. She stated that a RN will schedule a time to come out and wrap her legs next week.  rx bottles verified; pt's pill box refilled according to epic.   proben Medication ordered: Acetazolamide zolpid  Rhonda [name], EMT Paramedic [phone] 11/11/2018    ACTION: Home visit completed

## 2018-11-10 NOTE — Progress Notes (Signed)
Rhonda [name] to be D/C'd home er MD order.  Discussed with the patient and all questions fully answered.  VSS, Skin clean, dry and intact without evidence of skin break down, no evidence of skin tears noted. IV catheter discontinued intact. Site without signs and symptoms of complications. Dressing and pressure applied.  An After Visit Summary was printed and given to the patient. Patient received prescription.  D/c education completed with patient/family including follow up instructions, medication list, d/c activities limitations if indicated, with other d/c instructions as indicated by MD - patient able to verbalize understanding, all questions fully answered.   Patient instructed to return to ED, call 911, or call MD for any changes in condition.   Patient escorted via [place], and D/C home via private auto.   [name] Evangelist [facility] 11/10/2018 4:25 PM

## 2018-11-10 NOTE — Discharge Summary (Signed)
Physician Discharge Summary  Rhonda [name]:[id number] DOB: 1951/01/18 DOA: 11/08/2018  PCP: Maurice Small, MD  Admit date: 11/08/2018 Discharge date: 11/10/2018  Time spent: 45 minutes  Recommendations for Outpatient Follow-up:  1. Follow up with Heart failure team 1 week for evaluation volume status and medication. Recommend bmet to track potassium level.  2. Follow up with PCP 1-2 weeks for evaluation of thyroid.  3. Home health PT    Discharge Diagnoses:  Principal Problem:   Hypokalemia Active Problems:   On home oxygen therapy   Orthostatic hypotension   CKD (chronic kidney disease) stage 3, GFR 30-59 ml/min (HCC)   Breast cancer, stage 1 ([place])   Hypothyroidism   Hyperlipidemia   Discharge Condition: stable   Diet recommendation: heart healthy no sodium  Filed Weights   11/09/18 2208 11/10/18 0547  Weight: 76.4 kg 79.3 kg    History of present illness:  Rhonda [name] is a 68 y.o. female with medical history significant of hypothyroidism; HTN; HLD; DM; chronic systolic CHF with AICD; cleft palate; and stage 1 breast cancer s/p lumpectomy/chemo/rads (2012) presented 6/16 with slurred speech and dizziness.  She has a cleft palate and she doesn't have her dentures in.  "Now that they say that my potassium is low, I understand why I've been laying around."  She had been tired and weak.  She felt like she was in a daze, "like an out of body thing."  She had been falling a lot - last year, it was so many times but this year only 2 times.  Her legs have been hurting; they are wrapped in Unna boots "and I just want to cut off my legs, they are so uncomfortable".  work up revealed normal head CT, hypokalemia <2.0. triad asked to admit  Hospital Course:  Hypokalemia. Likely related to medication adjustments of late. Chart review indicates recent visit to HF clinic and she was supposed to take metolazone for 2 days. Unclear if she did this. Chart indicated potassium supplements  stopped by Paramedicine 10/28/18. Today potassium level increased to 3.9 from <2.0. mag level within limits of normal. Home meds include diamox, demadex, spironolactone, torsemide and potassium.    Orthostatic hypotension. Long hx of same. Sees neurology for this -continue Midodrine -continue Coreg -continue Diamox   Stage 3 CKD creatinine 1.44 at discharge. this appears to be close to baseline.   Hypothyroidism TSH 0.168. Continue Synthroid at current dose for now- defer to outpatient PCP  HLD Continue Zocor  Breast CA Remote, without current evidence of recurrence  Chronic respiratory failure 2* to CHF. s/p AICD Most recent echo in 2/20 with EF 55-60%. Last seen by NP 2 weeks ago.  -Continue Demadex, hold spironolactone -Continue Coreg  Stasis -unna boot replaced 6/17 and patient removed 6/18 complaining "too tight". Requested unna boot changed again. PT evaluated recommended HH. Will order [facility]   Procedures:    Consultations:  Discharge Exam: Vitals:   11/10/18 0543 11/10/18 0849  BP: 118/64 120/68  Pulse: 70 76  Resp: 18   Temp: (!) 97.5 F (36.4 C)   SpO2: 100% 98%    General: awake alert no acute distress Cardiovascular: RRR no mgr 2+ bilateral LE edema. Bilateral unna boots removed by patient Respiratory: normal effort BS distant but clear  Discharge Instructions   Discharge Instructions    Call MD for:  difficulty breathing, headache or visual disturbances   Complete by: As directed    Call MD for:  persistant dizziness  or light-headedness   Complete by: As directed    Call MD for:  severe uncontrolled pain   Complete by: As directed    Diet - low sodium heart healthy   Complete by: As directed    Discharge instructions   Complete by: As directed    Take medications as prescribed.  Follow up with HF clinic 1 week for evaluation of volume status Need lab work 1 week to check potassium   Heart Failure patients record your daily weight using  the same scale at the same time of day   Complete by: As directed    Increase activity slowly   Complete by: As directed      Allergies as of 11/10/2018      Reactions   Ceftin Anaphylaxis   Face and throat swell    Shellfish Allergy Other (See Comments)   Gout exacerbation   Geodon [ziprasidone Hcl] Hives   Lisinopril Other (See Comments)   angioedema   Allopurinol Nausea Only, Other (See Comments)   weakness   Ativan [lorazepam] Itching   Sulfa Antibiotics Itching   Ultram [tramadol Hcl] Itching      Medication List    TAKE these medications   acetaZOLAMIDE 125 MG tablet Commonly known as: DIAMOX TAKE 1 TABLET(125 MG) BY MOUTH DAILY What changed: See the new instructions.   buPROPion 150 MG 24 hr tablet Commonly known as: WELLBUTRIN XL Take 450 mg by mouth every morning.   carboxymethylcellulose 0.5 % Soln Commonly known as: REFRESH PLUS Place 1 drop into both eyes 3 (three) times daily as needed (dry eyes).   carvedilol 3.125 MG tablet Commonly known as: COREG Take 3.125 mg by mouth 2 (two) times daily with a meal.   citalopram 20 MG tablet Commonly known as: CELEXA Take 20 mg by mouth daily.   clonazePAM 0.5 MG tablet Commonly known as: KLONOPIN Take 0.5 mg by mouth 3 (three) times daily as needed for anxiety.   colestipol 1 g tablet Commonly known as: COLESTID Take 2 g by mouth daily.   fexofenadine 180 MG tablet Commonly known as: ALLEGRA Take 180 mg by mouth 2 (two) times daily.   gabapentin 800 MG tablet Commonly known as: NEURONTIN Take 800 mg by mouth at bedtime as needed (neuropathy).   gabapentin 300 MG capsule Commonly known as: NEURONTIN Take 300 mg by mouth 3 (three) times daily.   ipratropium 0.03 % nasal spray Commonly known as: ATROVENT Place 2 sprays into both nostrils 2 (two) times daily.   levothyroxine 100 MCG tablet Commonly known as: SYNTHROID TAKE 1 TABLET(100 MCG) BY MOUTH DAILY What changed: See the new instructions.    metolazone 2.5 MG tablet Commonly known as: ZAROXOLYN Take 1 tablet (2.5 mg total) by mouth daily as needed (edema). Take only as directed by CHF clinic   midodrine 10 MG tablet Commonly known as: PROAMATINE Take 1 tablet (10 mg total) by mouth 3 (three) times daily with meals.   montelukast 10 MG tablet Commonly known as: SINGULAIR Take 10 mg by mouth at bedtime.   potassium chloride SA 20 MEQ tablet Commonly known as: K-DUR Take 2 tablets (40 mEq total) by mouth 2 (two) times daily. What changed: how much to take   simvastatin 10 MG tablet Commonly known as: ZOCOR Take 10 mg by mouth every evening.   spironolactone 25 MG tablet Commonly known as: ALDACTONE Take 0.5 tablets (12.5 mg total) by mouth every evening.   torsemide 20 MG tablet Commonly  known as: DEMADEX Take 60 mg by mouth daily.   zolpidem 10 MG tablet Commonly known as: AMBIEN Take 10 mg by mouth at bedtime as needed for sleep. For sleep      Allergies  Allergen Reactions  . Ceftin Anaphylaxis    Face and throat swell   . Shellfish Allergy Other (See Comments)    Gout exacerbation  . Geodon [Ziprasidone Hcl] Hives  . Lisinopril Other (See Comments)    angioedema  . Allopurinol Nausea Only and Other (See Comments)    weakness  . Ativan [Lorazepam] Itching  . Sulfa Antibiotics Itching  . Ultram [Tramadol Hcl] Itching      The results of significant diagnostics from this hospitalization (including imaging, microbiology, ancillary and laboratory) are listed below for reference.    Significant Diagnostic Studies: Dg Chest 2 View  Result Date: 11/08/2018 CLINICAL DATA:  Peripheral edema. Mental status changes. EXAM: CHEST - 2 VIEW COMPARISON:  06/21/2017 FINDINGS: Lower thoracic spondylosis. Similar appearance of pacer/AICD device. Surgical clips project over the left breast. Midline trachea. Normal heart size. Tortuous thoracic aorta. Atherosclerosis in the transverse aorta. No pleural effusion or  pneumothorax. Clear lungs. IMPRESSION: No acute cardiopulmonary disease. Aortic Atherosclerosis (ICD10-I70.0). Electronically Signed   By: Abigail Miyamoto M.D.   On: 11/08/2018 14:03   Ct Head Wo Contrast  Result Date: 11/08/2018 CLINICAL DATA:  Slurred speech and dizziness today.  Headache. EXAM: CT HEAD WITHOUT CONTRAST TECHNIQUE: Contiguous axial images were obtained from the base of the skull through the vertex without intravenous contrast. COMPARISON:  CT head 08/10/2018 FINDINGS: Brain: Ventricle size normal. Negative for infarct, hemorrhage, mass. Extensive ossification of the falx. Vascular: Negative for hyperdense vessel Skull: No acute abnormality. Sinuses/Orbits: Negative Other: None IMPRESSION: Negative CT head Electronically Signed   By: Franchot Gallo M.D.   On: 11/08/2018 15:16    Microbiology: Recent Results (from the past 240 hour(s))  Novel Coronavirus,NAA,(SEND-OUT TO REF LAB - TAT 24-48 hrs); Hosp Order     Status: None   Collection Time: 11/08/18  3:52 PM   Specimen: Nasopharyngeal Swab; Respiratory  Result Value Ref Range Status   SARS-CoV-2, NAA NOT DETECTED NOT DETECTED Final    Comment: (NOTE) This test was developed and its performance characteristics determined by [employer]. This test has not been FDA cleared or approved. This test has been authorized by FDA under an Emergency Use Authorization (EUA). This test is only authorized for the duration of time the declaration that circumstances exist justifying the authorization of the emergency use of in vitro diagnostic tests for detection of SARS-CoV-2 virus and/or diagnosis of COVID-19 infection under section 564(b)(1) of the Act, 21 U.S.C. 161WRU-0(A)(5), unless the authorization is terminated or revoked sooner. When diagnostic testing is negative, the possibility of a false negative result should be considered in the context of a patient's recent exposures and the presence of clinical signs and symptoms  consistent with COVID-19. An individual without symptoms of COVID-19 and who is not shedding SARS-CoV-2 virus would expect to have a negative (not detected) result in this assay. Performed  At: [facility] [address] [place], [place] [id number] Rush Farmer MD NW:[id number]    [place]  Final    Comment: Performed at [facility], [place] [address], [place], [place] [address]     Labs: Basic Metabolic Panel: Recent Labs  Lab 11/08/18 1400 11/09/18 0842 11/10/18 0332 11/10/18 1245  NA 141 142 141 138  K <2.0* 2.9* 3.0*  3.9  CL 93* 101 101 98  CO2 33* 31 30 28   GLUCOSE 99 158* 93 146*  BUN 25* 22 25* 25*  CREATININE 1.75* 1.53* 1.44* 1.21*  CALCIUM 8.8* 8.8* 8.7* 9.0  MG  --  2.4  --   --    Liver Function Tests: Recent Labs  Lab 11/08/18 1400  AST 46*  ALT 29  ALKPHOS 152*  BILITOT 0.6  PROT 7.7  ALBUMIN 3.3*   No results for input(s): LIPASE, AMYLASE in the last 168 hours. No results for input(s): AMMONIA in the last 168 hours. CBC: Recent Labs  Lab 11/08/18 1400 11/09/18 0842  WBC 7.2 6.4  NEUTROABS 4.8  --   HGB 12.1 12.2  HCT 36.6 37.0  MCV 100.5* 98.9  PLT 274 300   Cardiac Enzymes: No results for input(s): CKTOTAL, CKMB, CKMBINDEX, TROPONINI in the last 168 hours. BNP: BNP (last 3 results) Recent Labs    11/08/18 1400  BNP 95.5    ProBNP (last 3 results) No results for input(s): PROBNP in the last 8760 hours.  CBG: No results for input(s): GLUCAP in the last 168 hours.     Signed:  Eulogio Bear DO [facility] 11/10/2018, 2:15 PM

## 2018-11-13 DIAGNOSIS — I428 Other cardiomyopathies: Secondary | ICD-10-CM | POA: Diagnosis not present

## 2018-11-13 DIAGNOSIS — I130 Hypertensive heart and chronic kidney disease with heart failure and stage 1 through stage 4 chronic kidney disease, or unspecified chronic kidney disease: Secondary | ICD-10-CM | POA: Diagnosis not present

## 2018-11-13 DIAGNOSIS — I878 Other specified disorders of veins: Secondary | ICD-10-CM | POA: Diagnosis not present

## 2018-11-13 DIAGNOSIS — E1142 Type 2 diabetes mellitus with diabetic polyneuropathy: Secondary | ICD-10-CM | POA: Diagnosis not present

## 2018-11-13 DIAGNOSIS — N183 Chronic kidney disease, stage 3 (moderate): Secondary | ICD-10-CM | POA: Diagnosis not present

## 2018-11-13 DIAGNOSIS — Z6825 Body mass index (BMI) 25.0-25.9, adult: Secondary | ICD-10-CM | POA: Diagnosis not present

## 2018-11-13 DIAGNOSIS — I5022 Chronic systolic (congestive) heart failure: Secondary | ICD-10-CM | POA: Diagnosis not present

## 2018-11-13 DIAGNOSIS — I951 Orthostatic hypotension: Secondary | ICD-10-CM | POA: Diagnosis not present

## 2018-11-13 DIAGNOSIS — E1122 Type 2 diabetes mellitus with diabetic chronic kidney disease: Secondary | ICD-10-CM | POA: Diagnosis not present

## 2018-11-15 DIAGNOSIS — I878 Other specified disorders of veins: Secondary | ICD-10-CM | POA: Diagnosis not present

## 2018-11-15 DIAGNOSIS — E1142 Type 2 diabetes mellitus with diabetic polyneuropathy: Secondary | ICD-10-CM | POA: Diagnosis not present

## 2018-11-15 DIAGNOSIS — E1122 Type 2 diabetes mellitus with diabetic chronic kidney disease: Secondary | ICD-10-CM | POA: Diagnosis not present

## 2018-11-15 DIAGNOSIS — N183 Chronic kidney disease, stage 3 (moderate): Secondary | ICD-10-CM | POA: Diagnosis not present

## 2018-11-15 DIAGNOSIS — I5022 Chronic systolic (congestive) heart failure: Secondary | ICD-10-CM | POA: Diagnosis not present

## 2018-11-15 DIAGNOSIS — I130 Hypertensive heart and chronic kidney disease with heart failure and stage 1 through stage 4 chronic kidney disease, or unspecified chronic kidney disease: Secondary | ICD-10-CM | POA: Diagnosis not present

## 2018-11-15 DIAGNOSIS — I428 Other cardiomyopathies: Secondary | ICD-10-CM | POA: Diagnosis not present

## 2018-11-15 DIAGNOSIS — Z6825 Body mass index (BMI) 25.0-25.9, adult: Secondary | ICD-10-CM | POA: Diagnosis not present

## 2018-11-15 DIAGNOSIS — I951 Orthostatic hypotension: Secondary | ICD-10-CM | POA: Diagnosis not present

## 2018-11-16 DIAGNOSIS — Z1231 Encounter for screening mammogram for malignant neoplasm of breast: Secondary | ICD-10-CM | POA: Diagnosis not present

## 2018-11-16 DIAGNOSIS — Z853 Personal history of malignant neoplasm of breast: Secondary | ICD-10-CM | POA: Diagnosis not present

## 2018-11-17 ENCOUNTER — Other Ambulatory Visit (HOSPITAL_COMMUNITY): Payer: Self-pay

## 2018-11-17 DIAGNOSIS — Z5181 Encounter for therapeutic drug level monitoring: Secondary | ICD-10-CM | POA: Diagnosis not present

## 2018-11-17 DIAGNOSIS — I10 Essential (primary) hypertension: Secondary | ICD-10-CM | POA: Diagnosis not present

## 2018-11-17 NOTE — Progress Notes (Signed)
Paramedicine Encounter    Patient ID: Rhonda [name], female    DOB: [birth year], 68 y.o.   MRN: [id number]    Patient Care Team: Maurice Small, MD as PCP - General (Family Medicine) [name], LCSW as Social Worker (Licensed Clinical Social Worker)  Patient Active Problem List   Diagnosis Date Noted  . Hypokalemia 11/08/2018  . CKD (chronic kidney disease) stage 3, GFR 30-59 ml/min (HCC) 11/08/2018  . Pain, dental 08/31/2018  . Orthostatic hypotension 07/28/2017  . SVD (spontaneous vaginal delivery)   . Peripheral neuropathy   . On home oxygen therapy   . Migraines   . Left bundle branch block   . Hypothyroidism   . Hypertension   . Hyperlipidemia   . Heart murmur   . GERD (gastroesophageal reflux disease)   . Exertional shortness of breath   . Depression   . Complication of anesthesia   . Back pain   . Arthritis   . Anxiety   . Anemia   . HTN (hypertension) 09/14/2013  . Chronic respiratory failure ([place]) 09/14/2013  . 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 Morbid obesity ([place]) 11/01/2012  . Chronic systolic heart failure ([place]) 10/27/2012  . S/P right TKA 05/10/2012  . Endometrial polyp 01/20/2012  . Breast cancer, stage 1 ([place]) 03/26/2011  . Unspecified vitamin D deficiency 03/26/2011    Current Outpatient Medications:  .  acetaZOLAMIDE (DIAMOX) 125 MG tablet, TAKE 1 TABLET(125 MG) BY MOUTH DAILY (Patient taking differently: Take 125 mg by mouth daily. ), Disp: 30 tablet, Rfl: 5 .  buPROPion (WELLBUTRIN XL) 150 MG 24 hr tablet, Take 450 mg by mouth every morning. , Disp: , Rfl:  .  carboxymethylcellulose (REFRESH PLUS) 0.5 % SOLN, Place 1 drop into both eyes 3 (three) times daily as needed (dry eyes)., Disp: , Rfl:  .  carvedilol (COREG) 3.125 MG tablet, Take 3.125 mg by mouth 2 (two) times daily with a meal., Disp: , Rfl:  .  citalopram (CELEXA) 20 MG tablet, Take 20 mg by mouth daily., Disp: , Rfl:  .  clonazePAM  (KLONOPIN) 0.5 MG tablet, Take 0.5 mg by mouth 3 (three) times daily as needed for anxiety. , Disp: , Rfl:  .  colestipol (COLESTID) 1 g tablet, Take 2 g by mouth daily. , Disp: , Rfl:  .  fexofenadine (ALLEGRA) 180 MG tablet, Take 180 mg by mouth 2 (two) times daily., Disp: , Rfl:  .  gabapentin (NEURONTIN) 300 MG capsule, Take 300 mg by mouth 3 (three) times daily. , Disp: , Rfl:  .  gabapentin (NEURONTIN) 800 MG tablet, Take 800 mg by mouth at bedtime as needed (neuropathy). , Disp: , Rfl:  .  ipratropium (ATROVENT) 0.03 % nasal spray, Place 2 sprays into both nostrils 2 (two) times daily., Disp: , Rfl:  .  levothyroxine (SYNTHROID, LEVOTHROID) 100 MCG tablet, TAKE 1 TABLET(100 MCG) BY MOUTH DAILY (Patient taking differently: Take 100 mcg by mouth daily. ), Disp: 45 tablet, Rfl: 3 .  metolazone (ZAROXOLYN) 2.5 MG tablet, Take 1 tablet (2.5 mg total) by mouth daily as needed (edema). Take only as directed by CHF clinic, Disp: , Rfl:  .  midodrine (PROAMATINE) 10 MG tablet, Take 1 tablet (10 mg total) by mouth 3 (three) times daily with meals., Disp: 90 tablet, Rfl: 3 .  montelukast (SINGULAIR) 10 MG tablet, Take 10 mg by mouth at bedtime. , Disp: , Rfl: 1 .  potassium chloride SA (K-DUR) 20 MEQ  tablet, Take 2 tablets (40 mEq total) by mouth 2 (two) times daily., Disp: 180 tablet, Rfl: 11 .  simvastatin (ZOCOR) 10 MG tablet, Take 10 mg by mouth every evening. , Disp: , Rfl:  .  spironolactone (ALDACTONE) 25 MG tablet, Take 0.5 tablets (12.5 mg total) by mouth every evening., Disp: 45 tablet, Rfl: 1 .  torsemide (DEMADEX) 20 MG tablet, Take 60 mg by mouth daily., Disp: , Rfl:  .  zolpidem (AMBIEN) 10 MG tablet, Take 10 mg by mouth at bedtime as needed for sleep. For sleep , Disp: , Rfl:  Allergies  Allergen Reactions  . Ceftin Anaphylaxis    Face and throat swell   . Shellfish Allergy Other (See Comments)    Gout exacerbation  . Geodon [Ziprasidone Hcl] Hives  . Lisinopril Other (See Comments)     angioedema  . Allopurinol Nausea Only and Other (See Comments)    weakness  . Ativan [Lorazepam] Itching  . Sulfa Antibiotics Itching  . Ultram [Tramadol Hcl] Itching     Social History   Socioeconomic History  . Marital status: Married    Spouse name: Not on file  . Number of children: 2  . Years of education: masters  . Highest education level: Not on file  Occupational History  . Not on file  Social Needs  . Financial resource strain: Not on file  . Food insecurity    Worry: Not on file    Inability: Not on file  . Transportation needs    Medical: Not on file    Non-medical: Not on file  Tobacco Use  . Smoking status: Former Smoker    Packs/day: 0.10    Years: 26.00    Pack years: 2.60    Types: Cigarettes    Quit date: 05/06/2007    Years since quitting: 11.5  . Smokeless tobacco: Never Used  Substance and Sexual Activity  . Alcohol use: No  . Drug use: No  . Sexual activity: Yes  Lifestyle  . Physical activity    Days per week: Not on file    Minutes per session: Not on file  . Stress: Not on file  Relationships  . Social Herbalist on phone: Not on file    Gets together: Not on file    Attends religious service: Not on file    Active member of club or organization: Not on file    Attends meetings of clubs or organizations: Not on file    Relationship status: Not on file  . Intimate partner violence    Fear of current or ex partner: Not on file    Emotionally abused: Not on file    Physically abused: Not on file    Forced sexual activity: Not on file  Other Topics Concern  . Not on file  Social History Narrative   Tobacco Use Cigarettes: Former Smoker, Quit in 2008   No Alcohol   No recreational drug use   Diet: Regular/Low Carb   Exercise: None   Occupation: disabled   Education: Research officer, political party, masters   Children: 2   Firearms: No   Therapist, art Use: Always   Former Metallurgist.        Physical Exam Pulmonary:     Effort:  Pulmonary effort is normal. No respiratory distress.     Breath sounds: No wheezing or rales.  Abdominal:     General: There is no distension.  Musculoskeletal:     Right  lower leg: Edema present.     Left lower leg: Edema present.  Skin:    General: Skin is warm and dry.         Future Appointments  Date Time Provider [place]  12/07/2018  7:50 AM Narda Amber K, DO [facility] None  12/08/2018 10:15 AM Philemon Kingdom, MD [facility] None  01/11/2019  1:30 PM Marzetta Board, DPM [facility]  01/18/2019  7:25 AM [facility] DEVICE REMOTES [facility]     There were no vitals taken for this visit.  Weight yesterday-175   ATF pt CAO x4 sitting in the living room watching tv. She has an appointment with Dr. Justin Mend at 3 today.  She has taken her medications for today.  Pt denies sob, chest pain and dizziness. She and her husband reports that she fell asleep while she was cooking.  Her husband said that this happened before.  Mrs. Galvan said that this usually happens after takes her night meds.  I told her that she should take her bed time meds while she's in bed.  rx bottles verified and pill box refilled.  Her husband said that he will call if there's any medication changes during this visit.    Probenecid 500mg  dr webb BID  Medication ordered: Zolpidem filled until wed Acetazolamide  Yaw Escoto, EMT Paramedic [phone] 11/17/2018    ACTION: Home visit completed

## 2018-11-21 NOTE — Progress Notes (Signed)
No ICM remote transmission received for 11/01/2018 and next ICM transmission scheduled for 11/28/2018.

## 2018-11-22 DIAGNOSIS — Z6825 Body mass index (BMI) 25.0-25.9, adult: Secondary | ICD-10-CM | POA: Diagnosis not present

## 2018-11-22 DIAGNOSIS — I951 Orthostatic hypotension: Secondary | ICD-10-CM | POA: Diagnosis not present

## 2018-11-22 DIAGNOSIS — I428 Other cardiomyopathies: Secondary | ICD-10-CM | POA: Diagnosis not present

## 2018-11-22 DIAGNOSIS — I878 Other specified disorders of veins: Secondary | ICD-10-CM | POA: Diagnosis not present

## 2018-11-22 DIAGNOSIS — I130 Hypertensive heart and chronic kidney disease with heart failure and stage 1 through stage 4 chronic kidney disease, or unspecified chronic kidney disease: Secondary | ICD-10-CM | POA: Diagnosis not present

## 2018-11-22 DIAGNOSIS — E1142 Type 2 diabetes mellitus with diabetic polyneuropathy: Secondary | ICD-10-CM | POA: Diagnosis not present

## 2018-11-22 DIAGNOSIS — E1122 Type 2 diabetes mellitus with diabetic chronic kidney disease: Secondary | ICD-10-CM | POA: Diagnosis not present

## 2018-11-22 DIAGNOSIS — N183 Chronic kidney disease, stage 3 (moderate): Secondary | ICD-10-CM | POA: Diagnosis not present

## 2018-11-22 DIAGNOSIS — I5022 Chronic systolic (congestive) heart failure: Secondary | ICD-10-CM | POA: Diagnosis not present

## 2018-11-23 DIAGNOSIS — I951 Orthostatic hypotension: Secondary | ICD-10-CM | POA: Diagnosis not present

## 2018-11-23 DIAGNOSIS — E1142 Type 2 diabetes mellitus with diabetic polyneuropathy: Secondary | ICD-10-CM | POA: Diagnosis not present

## 2018-11-23 DIAGNOSIS — I130 Hypertensive heart and chronic kidney disease with heart failure and stage 1 through stage 4 chronic kidney disease, or unspecified chronic kidney disease: Secondary | ICD-10-CM | POA: Diagnosis not present

## 2018-11-23 DIAGNOSIS — I428 Other cardiomyopathies: Secondary | ICD-10-CM | POA: Diagnosis not present

## 2018-11-23 DIAGNOSIS — Z09 Encounter for follow-up examination after completed treatment for conditions other than malignant neoplasm: Secondary | ICD-10-CM | POA: Diagnosis not present

## 2018-11-23 DIAGNOSIS — Z6825 Body mass index (BMI) 25.0-25.9, adult: Secondary | ICD-10-CM | POA: Diagnosis not present

## 2018-11-23 DIAGNOSIS — I878 Other specified disorders of veins: Secondary | ICD-10-CM | POA: Diagnosis not present

## 2018-11-23 DIAGNOSIS — E1122 Type 2 diabetes mellitus with diabetic chronic kidney disease: Secondary | ICD-10-CM | POA: Diagnosis not present

## 2018-11-23 DIAGNOSIS — N183 Chronic kidney disease, stage 3 (moderate): Secondary | ICD-10-CM | POA: Diagnosis not present

## 2018-11-23 DIAGNOSIS — I5022 Chronic systolic (congestive) heart failure: Secondary | ICD-10-CM | POA: Diagnosis not present

## 2018-11-23 DIAGNOSIS — E876 Hypokalemia: Secondary | ICD-10-CM | POA: Diagnosis not present

## 2018-11-24 ENCOUNTER — Telehealth (HOSPITAL_COMMUNITY): Payer: Self-pay

## 2018-11-24 ENCOUNTER — Other Ambulatory Visit (HOSPITAL_COMMUNITY): Payer: Self-pay

## 2018-11-24 DIAGNOSIS — N760 Acute vaginitis: Secondary | ICD-10-CM | POA: Diagnosis not present

## 2018-11-24 DIAGNOSIS — K8020 Calculus of gallbladder without cholecystitis without obstruction: Secondary | ICD-10-CM | POA: Diagnosis not present

## 2018-11-24 NOTE — Telephone Encounter (Signed)
Orders faxed to [facility]. Confirmation received. Copy placed in [facility] folder

## 2018-11-24 NOTE — Telephone Encounter (Signed)
Rhonda [name] called and said that he and [name] are out shopping. He asked if we could push our visit back a hour or so.  I told him that i'll call him when i'm on my way.

## 2018-11-24 NOTE — Progress Notes (Signed)
Paramedicine Encounter    Patient ID: Rhonda [name], female    DOB: 02-17-1951, 68 y.o.   MRN: [id number]    Patient Care Team: Maurice Small, MD as PCP - General (Family Medicine) [name], LCSW as Social Worker (Licensed Clinical Social Worker)  Patient Active Problem List   Diagnosis Date Noted  . Hypokalemia 11/08/2018  . CKD (chronic kidney disease) stage 3, GFR 30-59 ml/min (HCC) 11/08/2018  . Pain, dental 08/31/2018  . Orthostatic hypotension 07/28/2017  . SVD (spontaneous vaginal delivery)   . Peripheral neuropathy   . On home oxygen therapy   . Migraines   . Left bundle branch block   . Hypothyroidism   . Hypertension   . Hyperlipidemia   . Heart murmur   . GERD (gastroesophageal reflux disease)   . Exertional shortness of breath   . Depression   . Complication of anesthesia   . Back pain   . Arthritis   . Anxiety   . Anemia   . HTN (hypertension) 09/14/2013  . Chronic respiratory failure ([place]) 09/14/2013  . 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 Morbid obesity ([place]) 11/01/2012  . Chronic systolic heart failure ([place]) 10/27/2012  . S/P right TKA 05/10/2012  . Endometrial polyp 01/20/2012  . Breast cancer, stage 1 ([place]) 03/26/2011  . Unspecified vitamin D deficiency 03/26/2011    Current Outpatient Medications:  .  acetaZOLAMIDE (DIAMOX) 125 MG tablet, TAKE 1 TABLET(125 MG) BY MOUTH DAILY (Patient taking differently: Take 125 mg by mouth daily. ), Disp: 30 tablet, Rfl: 5 .  buPROPion (WELLBUTRIN XL) 150 MG 24 hr tablet, Take 450 mg by mouth every morning. , Disp: , Rfl:  .  carboxymethylcellulose (REFRESH PLUS) 0.5 % SOLN, Place 1 drop into both eyes 3 (three) times daily as needed (dry eyes)., Disp: , Rfl:  .  carvedilol (COREG) 3.125 MG tablet, Take 3.125 mg by mouth 2 (two) times daily with a meal., Disp: , Rfl:  .  citalopram (CELEXA) 20 MG tablet, Take 20 mg by mouth daily., Disp: , Rfl:  .  clonazePAM  (KLONOPIN) 0.5 MG tablet, Take 0.5 mg by mouth 3 (three) times daily as needed for anxiety. , Disp: , Rfl:  .  colestipol (COLESTID) 1 g tablet, Take 2 g by mouth daily. , Disp: , Rfl:  .  fexofenadine (ALLEGRA) 180 MG tablet, Take 180 mg by mouth 2 (two) times daily., Disp: , Rfl:  .  gabapentin (NEURONTIN) 300 MG capsule, Take 300 mg by mouth 3 (three) times daily. , Disp: , Rfl:  .  gabapentin (NEURONTIN) 800 MG tablet, Take 800 mg by mouth at bedtime as needed (neuropathy). , Disp: , Rfl:  .  ipratropium (ATROVENT) 0.03 % nasal spray, Place 2 sprays into both nostrils 2 (two) times daily., Disp: , Rfl:  .  levothyroxine (SYNTHROID, LEVOTHROID) 100 MCG tablet, TAKE 1 TABLET(100 MCG) BY MOUTH DAILY (Patient taking differently: Take 100 mcg by mouth daily. ), Disp: 45 tablet, Rfl: 3 .  metolazone (ZAROXOLYN) 2.5 MG tablet, Take 1 tablet (2.5 mg total) by mouth daily as needed (edema). Take only as directed by CHF clinic, Disp: , Rfl:  .  midodrine (PROAMATINE) 10 MG tablet, Take 1 tablet (10 mg total) by mouth 3 (three) times daily with meals., Disp: 90 tablet, Rfl: 3 .  montelukast (SINGULAIR) 10 MG tablet, Take 10 mg by mouth at bedtime. , Disp: , Rfl: 1 .  potassium chloride SA (K-DUR) 20 MEQ  tablet, Take 2 tablets (40 mEq total) by mouth 2 (two) times daily., Disp: 180 tablet, Rfl: 11 .  simvastatin (ZOCOR) 10 MG tablet, Take 10 mg by mouth every evening. , Disp: , Rfl:  .  spironolactone (ALDACTONE) 25 MG tablet, Take 0.5 tablets (12.5 mg total) by mouth every evening., Disp: 45 tablet, Rfl: 1 .  torsemide (DEMADEX) 20 MG tablet, Take 60 mg by mouth daily., Disp: , Rfl:  .  zolpidem (AMBIEN) 10 MG tablet, Take 10 mg by mouth at bedtime as needed for sleep. For sleep , Disp: , Rfl:  Allergies  Allergen Reactions  . Ceftin Anaphylaxis    Face and throat swell   . Shellfish Allergy Other (See Comments)    Gout exacerbation  . Geodon [Ziprasidone Hcl] Hives  . Lisinopril Other (See Comments)     angioedema  . Allopurinol Nausea Only and Other (See Comments)    weakness  . Ativan [Lorazepam] Itching  . Sulfa Antibiotics Itching  . Ultram [Tramadol Hcl] Itching     Social History   Socioeconomic History  . Marital status: Married    Spouse name: Not on file  . Number of children: 2  . Years of education: masters  . Highest education level: Not on file  Occupational History  . Not on file  Social Needs  . Financial resource strain: Not on file  . Food insecurity    Worry: Not on file    Inability: Not on file  . Transportation needs    Medical: Not on file    Non-medical: Not on file  Tobacco Use  . Smoking status: Former Smoker    Packs/day: 0.10    Years: 26.00    Pack years: 2.60    Types: Cigarettes    Quit date: 05/06/2007    Years since quitting: 11.5  . Smokeless tobacco: Never Used  Substance and Sexual Activity  . Alcohol use: No  . Drug use: No  . Sexual activity: Yes  Lifestyle  . Physical activity    Days per week: Not on file    Minutes per session: Not on file  . Stress: Not on file  Relationships  . Social Herbalist on phone: Not on file    Gets together: Not on file    Attends religious service: Not on file    Active member of club or organization: Not on file    Attends meetings of clubs or organizations: Not on file    Relationship status: Not on file  . Intimate partner violence    Fear of current or ex partner: Not on file    Emotionally abused: Not on file    Physically abused: Not on file    Forced sexual activity: Not on file  Other Topics Concern  . Not on file  Social History Narrative   Tobacco Use Cigarettes: Former Smoker, Quit in 2008   No Alcohol   No recreational drug use   Diet: Regular/Low Carb   Exercise: None   Occupation: disabled   Education: Research officer, political party, masters   Children: 2   Firearms: No   Therapist, art Use: Always   Former Metallurgist.        Physical Exam      Future  Appointments  Date Time Provider [place]  11/28/2018 11:10 AM [facility]OT[facility]  12/07/2018  7:50 AM Narda Amber K, DO [facility] None  12/08/2018 10:15 AM Philemon Kingdom, MD [facility]  None  01/11/2019  1:30 PM Marzetta Board, DPM [facility]  01/18/2019  7:25 AM [facility] DEVICE REMOTES [facility]     Pulse 76   Wt 176 lb (79.8 kg)   BMI 32.19 kg/m   Weight yesterday-175 Last visit weight-172  ATF pt CAO x4 standing in the kitchen, she stated that she's tired from running errands and doctors appointments. She saw her "thyroid doctor" today, no medication changes during this visit.  Pt has taken all of her medication for the past week.  She doesn't have any zolpidem , pt stated that she doesn't want to take it unless she really needs it. Rhonda [name] has had episodes of falling asleep while cooking.  Pt denies sob, chest pain and dizziness. She hasn't fallen recently.  Pt is worried that [facility] will no longer be wrapping her legs, which she's confused about. rx bottles verified and pill box refilled.   I will f/u on her concerns with [facility].    Medication ordered: Midodrine  Rhonda [name], EMT Paramedic [phone] 11/24/2018    ACTION: Home visit completed

## 2018-11-28 ENCOUNTER — Telehealth: Payer: Self-pay

## 2018-11-28 ENCOUNTER — Ambulatory Visit (INDEPENDENT_AMBULATORY_CARE_PROVIDER_SITE_OTHER): Payer: Medicare HMO

## 2018-11-28 DIAGNOSIS — I130 Hypertensive heart and chronic kidney disease with heart failure and stage 1 through stage 4 chronic kidney disease, or unspecified chronic kidney disease: Secondary | ICD-10-CM | POA: Diagnosis not present

## 2018-11-28 DIAGNOSIS — I878 Other specified disorders of veins: Secondary | ICD-10-CM | POA: Diagnosis not present

## 2018-11-28 DIAGNOSIS — I428 Other cardiomyopathies: Secondary | ICD-10-CM | POA: Diagnosis not present

## 2018-11-28 DIAGNOSIS — N183 Chronic kidney disease, stage 3 (moderate): Secondary | ICD-10-CM | POA: Diagnosis not present

## 2018-11-28 DIAGNOSIS — I951 Orthostatic hypotension: Secondary | ICD-10-CM | POA: Diagnosis not present

## 2018-11-28 DIAGNOSIS — I5022 Chronic systolic (congestive) heart failure: Secondary | ICD-10-CM | POA: Diagnosis not present

## 2018-11-28 DIAGNOSIS — E1122 Type 2 diabetes mellitus with diabetic chronic kidney disease: Secondary | ICD-10-CM | POA: Diagnosis not present

## 2018-11-28 DIAGNOSIS — Z95810 Presence of automatic (implantable) cardiac defibrillator: Secondary | ICD-10-CM

## 2018-11-28 DIAGNOSIS — Z6825 Body mass index (BMI) 25.0-25.9, adult: Secondary | ICD-10-CM | POA: Diagnosis not present

## 2018-11-28 DIAGNOSIS — E1142 Type 2 diabetes mellitus with diabetic polyneuropathy: Secondary | ICD-10-CM | POA: Diagnosis not present

## 2018-11-28 NOTE — Telephone Encounter (Signed)
Remote ICM transmission received.  Attempted call to patient regarding ICM remote transmission and no answer.  

## 2018-11-28 NOTE — Progress Notes (Signed)
EPIC Encounter for ICM Monitoring  Patient Name: Rhonda [name] is a 68 y.o. female Date: 11/28/2018 Primary Care Physican: Maurice Small, MD Primary Cardiologist:Bensimhon Electrophysiologist: Santina Evans Pacing: 100% LastWeight:178lbs  10/17/2018: Weight 178 lbs 10/25/2018 Weight: 173 lbs   Attempted call to patient and unable to reach.  Transmission reviewed.    Pt hospitalized 6/16 with dx of hypokalemia.   Impedance does not make significance improvement since February  even after after diuretics are adjusted by physicians.  She reports at [facility] calls that she eats foods that are high in salt.    Optivol thoracic impedanceremains abnormalsuggesting possible fluid accumulation since February.  Even after taking Metolazone with increased Torsemide after 6/4 OV, there is not a significant change in thoracic impedance and fluid index remains > 200 well above normal threshold.   Prescribed:Torsemide 20 mg3tablets (60 mg total)by mouthdaily.Potassium 20 meq 3tablets (60 mEq total)twice a day. Metolazone 2.5mg  1 tablet by mouth if needed for fluid or edema.  Labs: 11/10/2018 Creatinine 1.21, BUN 25, Potassium 3.9, Sodium 138, GFR 47-54 11/09/2018 Creatinine 1.53, BUN 22, Potassium 2.9, Sodium 142, GFR 35-41  11/08/2018 Creatinine 1.75, BUN 25, Potassium <2.0, Sodium 141, GFR 30-35  10/27/2018 Creatinine 1.44, BUN 15, Potassium 5.2, Sodium 142, GFR 38-44  A complete set of results can be found in Results Review.  Recommendations:Unable to reach.  Follow-up plan: ICM clinic phone appointment on8/02/2019.   Copy of ICM check sent to Dr.Taylor and Dr Haroldine Laws.  3 month ICM trend: 11/28/2018    1 Year ICM trend:       [name], RN 11/28/2018 12:53 PM

## 2018-11-30 DIAGNOSIS — E785 Hyperlipidemia, unspecified: Secondary | ICD-10-CM | POA: Diagnosis not present

## 2018-11-30 DIAGNOSIS — F330 Major depressive disorder, recurrent, mild: Secondary | ICD-10-CM | POA: Diagnosis not present

## 2018-11-30 DIAGNOSIS — E039 Hypothyroidism, unspecified: Secondary | ICD-10-CM | POA: Diagnosis not present

## 2018-11-30 DIAGNOSIS — M109 Gout, unspecified: Secondary | ICD-10-CM | POA: Diagnosis not present

## 2018-11-30 DIAGNOSIS — I5022 Chronic systolic (congestive) heart failure: Secondary | ICD-10-CM | POA: Diagnosis not present

## 2018-12-01 ENCOUNTER — Other Ambulatory Visit (HOSPITAL_COMMUNITY): Payer: Self-pay

## 2018-12-01 DIAGNOSIS — I951 Orthostatic hypotension: Secondary | ICD-10-CM | POA: Diagnosis not present

## 2018-12-01 DIAGNOSIS — E1142 Type 2 diabetes mellitus with diabetic polyneuropathy: Secondary | ICD-10-CM | POA: Diagnosis not present

## 2018-12-01 DIAGNOSIS — N183 Chronic kidney disease, stage 3 (moderate): Secondary | ICD-10-CM | POA: Diagnosis not present

## 2018-12-01 DIAGNOSIS — E1122 Type 2 diabetes mellitus with diabetic chronic kidney disease: Secondary | ICD-10-CM | POA: Diagnosis not present

## 2018-12-01 DIAGNOSIS — Z6825 Body mass index (BMI) 25.0-25.9, adult: Secondary | ICD-10-CM | POA: Diagnosis not present

## 2018-12-01 DIAGNOSIS — I5022 Chronic systolic (congestive) heart failure: Secondary | ICD-10-CM | POA: Diagnosis not present

## 2018-12-01 DIAGNOSIS — I878 Other specified disorders of veins: Secondary | ICD-10-CM | POA: Diagnosis not present

## 2018-12-01 DIAGNOSIS — I428 Other cardiomyopathies: Secondary | ICD-10-CM | POA: Diagnosis not present

## 2018-12-01 DIAGNOSIS — I130 Hypertensive heart and chronic kidney disease with heart failure and stage 1 through stage 4 chronic kidney disease, or unspecified chronic kidney disease: Secondary | ICD-10-CM | POA: Diagnosis not present

## 2018-12-01 NOTE — Progress Notes (Signed)
Paramedicine Encounter    Patient ID: Rhonda [name], female    DOB: [birth year], 68 y.o.   MRN: [id number]    Patient Care Team: Maurice Small, MD as PCP - General (Family Medicine) [name], LCSW as Social Worker (Licensed Clinical Social Worker)  Patient Active Problem List   Diagnosis Date Noted  . Hypokalemia 11/08/2018  . CKD (chronic kidney disease) stage 3, GFR 30-59 ml/min (HCC) 11/08/2018  . Pain, dental 08/31/2018  . Orthostatic hypotension 07/28/2017  . SVD (spontaneous vaginal delivery)   . Peripheral neuropathy   . On home oxygen therapy   . Migraines   . Left bundle branch block   . Hypothyroidism   . Hypertension   . Hyperlipidemia   . Heart murmur   . GERD (gastroesophageal reflux disease)   . Exertional shortness of breath   . Depression   . Complication of anesthesia   . Back pain   . Arthritis   . Anxiety   . Anemia   . HTN (hypertension) 09/14/2013  . Chronic respiratory failure ([place]) 09/14/2013  . 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 Morbid obesity ([place]) 11/01/2012  . Chronic systolic heart failure ([place]) 10/27/2012  . S/P right TKA 05/10/2012  . Endometrial polyp 01/20/2012  . Breast cancer, stage 1 ([place]) 03/26/2011  . Unspecified vitamin D deficiency 03/26/2011    Current Outpatient Medications:  .  buPROPion (WELLBUTRIN XL) 150 MG 24 hr tablet, Take 450 mg by mouth every morning. , Disp: , Rfl:  .  carvedilol (COREG) 3.125 MG tablet, Take 3.125 mg by mouth 2 (two) times daily with a meal., Disp: , Rfl:  .  citalopram (CELEXA) 20 MG tablet, Take 20 mg by mouth daily., Disp: , Rfl:  .  clonazePAM (KLONOPIN) 0.5 MG tablet, Take 0.5 mg by mouth 3 (three) times daily as needed for anxiety. , Disp: , Rfl:  .  levothyroxine (SYNTHROID, LEVOTHROID) 100 MCG tablet, TAKE 1 TABLET(100 MCG) BY MOUTH DAILY (Patient taking differently: Take 100 mcg by mouth daily. ), Disp: 45 tablet, Rfl: 3 .  midodrine  (PROAMATINE) 10 MG tablet, Take 1 tablet (10 mg total) by mouth 3 (three) times daily with meals., Disp: 90 tablet, Rfl: 3 .  potassium chloride SA (K-DUR) 20 MEQ tablet, Take 2 tablets (40 mEq total) by mouth 2 (two) times daily., Disp: 180 tablet, Rfl: 11 .  simvastatin (ZOCOR) 10 MG tablet, Take 10 mg by mouth every evening. , Disp: , Rfl:  .  spironolactone (ALDACTONE) 25 MG tablet, Take 0.5 tablets (12.5 mg total) by mouth every evening., Disp: 45 tablet, Rfl: 1 .  torsemide (DEMADEX) 20 MG tablet, Take 60 mg by mouth daily., Disp: , Rfl:  .  acetaZOLAMIDE (DIAMOX) 125 MG tablet, TAKE 1 TABLET(125 MG) BY MOUTH DAILY (Patient taking differently: Take 125 mg by mouth daily. ), Disp: 30 tablet, Rfl: 5 .  carboxymethylcellulose (REFRESH PLUS) 0.5 % SOLN, Place 1 drop into both eyes 3 (three) times daily as needed (dry eyes)., Disp: , Rfl:  .  colestipol (COLESTID) 1 g tablet, Take 2 g by mouth daily. , Disp: , Rfl:  .  fexofenadine (ALLEGRA) 180 MG tablet, Take 180 mg by mouth 2 (two) times daily., Disp: , Rfl:  .  gabapentin (NEURONTIN) 300 MG capsule, Take 300 mg by mouth 3 (three) times daily. , Disp: , Rfl:  .  gabapentin (NEURONTIN) 800 MG tablet, Take 800 mg by mouth at bedtime as needed (neuropathy). ,  Disp: , Rfl:  .  ipratropium (ATROVENT) 0.03 % nasal spray, Place 2 sprays into both nostrils 2 (two) times daily., Disp: , Rfl:  .  metolazone (ZAROXOLYN) 2.5 MG tablet, Take 1 tablet (2.5 mg total) by mouth daily as needed (edema). Take only as directed by CHF clinic, Disp: , Rfl:  .  montelukast (SINGULAIR) 10 MG tablet, Take 10 mg by mouth at bedtime. , Disp: , Rfl: 1 .  zolpidem (AMBIEN) 10 MG tablet, Take 10 mg by mouth at bedtime as needed for sleep. For sleep , Disp: , Rfl:  Allergies  Allergen Reactions  . Ceftin Anaphylaxis    Face and throat swell   . Shellfish Allergy Other (See Comments)    Gout exacerbation  . Geodon [Ziprasidone Hcl] Hives  . Lisinopril Other (See Comments)     angioedema  . Allopurinol Nausea Only and Other (See Comments)    weakness  . Ativan [Lorazepam] Itching  . Sulfa Antibiotics Itching  . Ultram [Tramadol Hcl] Itching     Social History   Socioeconomic History  . Marital status: Married    Spouse name: Not on file  . Number of children: 2  . Years of education: masters  . Highest education level: Not on file  Occupational History  . Not on file  Social Needs  . Financial resource strain: Not on file  . Food insecurity    Worry: Not on file    Inability: Not on file  . Transportation needs    Medical: Not on file    Non-medical: Not on file  Tobacco Use  . Smoking status: Former Smoker    Packs/day: 0.10    Years: 26.00    Pack years: 2.60    Types: Cigarettes    Quit date: 05/06/2007    Years since quitting: 11.5  . Smokeless tobacco: Never Used  Substance and Sexual Activity  . Alcohol use: No  . Drug use: No  . Sexual activity: Yes  Lifestyle  . Physical activity    Days per week: Not on file    Minutes per session: Not on file  . Stress: Not on file  Relationships  . Social Herbalist on phone: Not on file    Gets together: Not on file    Attends religious service: Not on file    Active member of club or organization: Not on file    Attends meetings of clubs or organizations: Not on file    Relationship status: Not on file  . Intimate partner violence    Fear of current or ex partner: Not on file    Emotionally abused: Not on file    Physically abused: Not on file    Forced sexual activity: Not on file  Other Topics Concern  . Not on file  Social History Narrative   Tobacco Use Cigarettes: Former Smoker, Quit in 2008   No Alcohol   No recreational drug use   Diet: Regular/Low Carb   Exercise: None   Occupation: disabled   Education: Research officer, political party, masters   Children: 2   Firearms: No   Therapist, art Use: Always   Former Metallurgist.        Physical Exam Pulmonary:     Effort: No  respiratory distress.     Breath sounds: No wheezing or rales.  Musculoskeletal:        General: Swelling present.     Comments: Right wrist swollen  Skin:  General: Skin is warm and dry.         Future Appointments  Date Time Provider [place]  12/07/2018  7:50 AM Narda Amber K, DO [facility] None  12/08/2018 10:15 AM Philemon Kingdom, MD [facility] None  01/02/2019  9:50 AM [facility] DEVICE REMOT[facility]  01/11/2019  1:30 PM Marzetta Board, DPM [facility]  01/18/2019  7:25 AM [facility] DEVICE REMOT[facility]     BP 118/70 (BP Location: Left Arm, Patient Position: Sitting, Cuff Size: Normal)   Pulse 75   Temp 98.6 F (37 C) (Temporal)   Wt 170 lb (77.1 kg)   SpO2 98%   BMI 31.09 kg/m   Weight yesterday-171 pcp office Last visit weight-176  ATF pt CAO x4 standing in the kitchen holding her right arm in her left hand c/o pain.  She stated that while riding home yesterday, she noticed that her right wrist was throbbing.  She said that the swelling began shortly afterwards.  The pain increases with movement and palpation.  Mrs. Forgy denies falling and she has no idea what happened to her wrist.  Her husband got a wrist wrap; pt said that is talking tylenol for the pain.  She has no other complaints today; no sob, no chest pain and no dizziness.   During the visit with Dr. Justin Mend yesterday, she categorized pt's medications (needed and as needed).  Mr. Philbert said that pt is taking too many medications and he would like to reduce the amount of pills that she's taking. Pt's pill box refilled including clonazepam; the other PRN medications were left out of her pill box.  I put the medications in a sandwich bag with "as needed" written on it.      Medication ordered:  Medication change during pcp visit: Increase citalopram 1.5 tab 30mg  Stop Probenecid  Arpita Fentress, EMT  Paramedic [phone] 12/01/2018    ACTION: Home visit completed

## 2018-12-06 DIAGNOSIS — M109 Gout, unspecified: Secondary | ICD-10-CM | POA: Diagnosis not present

## 2018-12-07 ENCOUNTER — Telehealth: Payer: Medicare HMO | Admitting: Neurology

## 2018-12-07 DIAGNOSIS — E1122 Type 2 diabetes mellitus with diabetic chronic kidney disease: Secondary | ICD-10-CM | POA: Diagnosis not present

## 2018-12-07 DIAGNOSIS — Z6825 Body mass index (BMI) 25.0-25.9, adult: Secondary | ICD-10-CM | POA: Diagnosis not present

## 2018-12-07 DIAGNOSIS — N183 Chronic kidney disease, stage 3 (moderate): Secondary | ICD-10-CM | POA: Diagnosis not present

## 2018-12-07 DIAGNOSIS — I130 Hypertensive heart and chronic kidney disease with heart failure and stage 1 through stage 4 chronic kidney disease, or unspecified chronic kidney disease: Secondary | ICD-10-CM | POA: Diagnosis not present

## 2018-12-07 DIAGNOSIS — I5022 Chronic systolic (congestive) heart failure: Secondary | ICD-10-CM | POA: Diagnosis not present

## 2018-12-07 DIAGNOSIS — I951 Orthostatic hypotension: Secondary | ICD-10-CM | POA: Diagnosis not present

## 2018-12-07 DIAGNOSIS — E1142 Type 2 diabetes mellitus with diabetic polyneuropathy: Secondary | ICD-10-CM | POA: Diagnosis not present

## 2018-12-07 DIAGNOSIS — I878 Other specified disorders of veins: Secondary | ICD-10-CM | POA: Diagnosis not present

## 2018-12-07 DIAGNOSIS — I428 Other cardiomyopathies: Secondary | ICD-10-CM | POA: Diagnosis not present

## 2018-12-08 ENCOUNTER — Ambulatory Visit (INDEPENDENT_AMBULATORY_CARE_PROVIDER_SITE_OTHER): Payer: Medicare HMO | Admitting: Internal Medicine

## 2018-12-08 ENCOUNTER — Other Ambulatory Visit: Payer: Self-pay

## 2018-12-08 ENCOUNTER — Encounter: Payer: Self-pay | Admitting: Internal Medicine

## 2018-12-08 ENCOUNTER — Other Ambulatory Visit (HOSPITAL_COMMUNITY): Payer: Self-pay

## 2018-12-08 DIAGNOSIS — E039 Hypothyroidism, unspecified: Secondary | ICD-10-CM | POA: Diagnosis not present

## 2018-12-08 MED ORDER — LEVOTHYROXINE SODIUM 88 MCG PO TABS: 88.0000 ug | ORAL_TABLET | Freq: Every day | ORAL | 3 refills | Status: DC

## 2018-12-08 NOTE — Progress Notes (Signed)
Patient ID: Rhonda [name], female   DOB: Jul 09, 1950, 68 y.o.   MRN: [id number]   Patient location: Home My location: Office  I connected with the patient on 12/08/18 at 9:44 AM EDT by telephone and verified that I am speaking with the correct person.   I discussed the limitations of evaluation and management by telephone and the availability of in person appointments. The patient expressed understanding and agreed to proceed.   Details of the encounter are shown below.  HPI  Rhonda [name] is a 68 y.o.-year-old female, initially referred by her neurologist, Dr. [name], presenting for follow-up for hypothyroidism. Last visit 6 months ago.  She recently had a gout episode. She also has increased swelling in legs - having her legs wrapped.  Pt. has been dx with hypothyroidism in ~2009 -started on levothyroxine, currently on 100 mcg daily.  At last 2 visits, she was not taking levothyroxine correctly (at night, after dinner, occasionally skipping doses, close to colestipol and multivitamins).  We discussed at length at the previous 2 visits about how to take this correctly.  She is now taking levothyroxine: - in am - fasting, with water - at least 30-60 min from b'fast - no Ca, Fe, PPIs  - + MVI moved to pm - not on Biotin  She started taking colestipol 04/2018 and she was taking this in the morning.  I strongly advised her to move this at least 4 hours after levothyroxine. Now off.  Reviewed patient's TFTs: Lab Results  Component Value Date   TSH 0.168 (L) 11/08/2018   TSH 1.20 02/10/2018   TSH 0.14 (L) 12/08/2017   Previously: Lab Results  Component Value Date   TSH 3.69 08/05/2009   TSH 0.372  11/02/2008   TSH 0.781 09/16/2008   FREET4 1.25 12/09/2017   T3FREE 2.9 12/09/2017   Antithyroid antibodies: No results found for: THGAB No components found for: TPOAB  Pt denies: - feeling nodules in neck - hoarseness - dysphagia - choking - SOB with lying down  She has no  FH of thyroid disorders. No FH of thyroid cancer. No h/o radiation tx to head or neck.  No herbal supplements. No Biotin use. No recent steroids use.   She also has secondary hyperparathyroidism, likely to CKD: Lab Results  Component Value Date   PTH 31 02/10/2018   PTH 109 (H) 12/08/2017   CALCIUM 9.0 11/10/2018   CALCIUM 8.7 (L) 11/10/2018   CALCIUM 8.8 (L) 11/09/2018   CALCIUM 8.8 (L) 11/08/2018   CALCIUM 9.3 10/27/2018   CALCIUM 8.8 (L) 07/26/2018   CALCIUM 9.0 07/18/2018   CALCIUM 9.2 06/30/2018   CALCIUM 9.0 03/30/2018   CALCIUM 9.4 02/10/2018   No vitamin D deficiency: Lab Results  Component Value Date   VD25OH 39.48 02/10/2018   VD25OH 49 07/16/2011  She takes a multivitamin.  + CKD: Lab Results  Component Value Date   BUN 25 (H) 11/10/2018   Lab Results  Component Value Date   CREATININE 1.21 (H) 11/10/2018   No history of kidney stones or osteoporosis.  She has a history of breast cancer.  ROS: Constitutional: no weight gain/no weight loss, no fatigue, no subjective hyperthermia, no subjective hypothermia Eyes: no blurry vision, no xerophthalmia ENT: no sore throat, + see HPI Cardiovascular: no CP/no SOB/no palpitations/+ leg swelling Respiratory: no cough/no SOB/no wheezing Gastrointestinal: no N/no V/+ D/no C/no acid reflux Musculoskeletal: no muscle aches/+ joint aches Skin: no rashes, no hair loss Neurological: no tremors/no numbness/no  tingling/no dizziness  I reviewed pt's medications, allergies, PMH, social hx, family hx, and changes were documented in the history of present illness. Otherwise, unchanged from my initial visit note.  Past Medical History:  Diagnosis Date  . Anemia   . Anxiety   . Arthritis    RIGHT KNEE ARTHRITIS AND PAIN-PT TOLD BONE ON BONE  . Asthma   . Back pain    DISK PROBLEM  . Breast cancer, stage 1 ([place]) 03/26/2011   left-FINISHED CHEMO  AND RADIATION  . Cardiomyopathy    [name]   . Chronic systolic heart failure (HCC)    a) NICM b) ECHO (03/2013) EF 20-25% c) ECHO (09/2013) EF 45-50%, grade I DD  . Complication of anesthesia    PT STATES HER B/P LOW AFTER ONE OF HER SURGERIES--SHE ATTRIBUTES TO LYING FLAT  . Depression   . Diabetes mellitus    "diet controlled" (05/04/2103)  . Exertional shortness of breath   . GERD (gastroesophageal reflux disease)   . Gout   . Heart murmur   . Hyperlipidemia   . Hypertension   . Hypothyroidism   . Left bundle branch block    s/p CRT-D (04/2013)  . Migraines   . On home oxygen therapy    "2L suppose to be q night" (05/03/2013)  . Peripheral neuropathy    feet  . SVD (spontaneous vaginal delivery)    x 2  . Unspecified vitamin D deficiency 03/26/2011   does not take meds   Past Surgical History:  Procedure Laterality Date  . BI-VENTRICULAR IMPLANTABLE CARDIOVERTER DEFIBRILLATOR N/A 05/03/2013   Procedure: BI-VENTRICULAR IMPLANTABLE CARDIOVERTER DEFIBRILLATOR  (CRT-D);  Surgeon: Evans Lance, MD;  Location[facility] C[facility] LAB;  Service: Cardiovascular;  Laterality: N/A;  . BI-VENTRICULAR IMPLANTABLE CARDIOVERTER DEFIBRILLATOR  (CRT-D)  05/03/2013   MDT CRTD implanted by Dr Lovena Le for non ischemic cardiomyopathy  . BIOPSY  05/31/2018   Procedure: BIOPSY;  Surgeon: Ronnette Juniper, MD;  Location: [facility];  Service: Gastroenterology;;  . BREAST LUMPECTOMY Left 07/28/2010  . CARDIAC CATHETERIZATION  2010   NORMAL CORONARY ARTERIES  . 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--11 SURGERIES     PT HAS REMOVABLE SPEECH BULB-TAKES IT OUT BEFORE HER SURGERY  . COLONOSCOPY    . COLONOSCOPY N/A 05/31/2018   Procedure: COLONOSCOPY;  Surgeon: Ronnette Juniper, MD;  Location: [facility];  Service: Gastroenterology;  Laterality: N/A;  . HYSTEROSCOPY W/D&C  01/20/2012   Procedure: DILATATION AND CURETTAGE /HYSTEROSCOPY;  Surgeon: Margarette Asal, MD;  Location: [facility];  Service: Gynecology;  Laterality: N/A;  with trueclear  . PORT-A-CATH REMOVAL   09/23/2011   Procedure: REMOVAL PORT-A-CATH;  Surgeon: Rolm Bookbinder, MD;  Location: [facility];  Service: General;  Laterality: N/A;  Port Removal  . PORTACATH PLACEMENT  2012  . TONSILLECTOMY  1960's  . TOTAL KNEE ARTHROPLASTY  05/10/2012   Procedure: TOTAL KNEE ARTHROPLASTY;  Surgeon: Mauri Pole, MD;  Location: [facility];  Service: Orthopedics;  Laterality: Right;  . WISDOM TOOTH EXTRACTION     Social History   Socioeconomic History  . Marital status: Married    Spouse name: Not on file  . Number of children: 2  . Years of education: masters  . Highest education level: Not on file  Occupational History  . Not on file  Social Needs  . Financial resource strain: Not on file  . Food insecurity    Worry: Not on file    Inability:  Not on file  . Transportation needs    Medical: Not on file    Non-medical: Not on file  Tobacco Use  . Smoking status: Former Smoker    Packs/day: 0.10    Years: 26.00    Pack years: 2.60    Types: Cigarettes    Quit date: 05/06/2007    Years since quitting: 11.6  . Smokeless tobacco: Never Used  Substance and Sexual Activity  . Alcohol use: No  . Drug use: No  . Sexual activity: Yes  Lifestyle  . Physical activity    Days per week: Not on file    Minutes per session: Not on file  . Stress: Not on file  Relationships  . Social Herbalist on phone: Not on file    Gets together: Not on file    Attends religious service: Not on file    Active member of club or organization: Not on file    Attends meetings of clubs or organizations: Not on file    Relationship status: Not on file  . Intimate partner violence    Fear of current or ex partner: Not on file    Emotionally abused: Not on file    Physically abused: Not on file    Forced sexual activity: Not on file  Other Topics Concern  . Not on file  Social History Narrative   Tobacco Use Cigarettes: Former Smoker, Quit in 2008   No Alcohol   No recreational drug use   Diet:  Regular/Low Carb   Exercise: None   Occupation: disabled   Education: Research officer, political party, masters   Children: 2   Firearms: No   Therapist, art Use: Always   Former Metallurgist.       Current Outpatient Medications on File Prior to Visit  Medication Sig Dispense Refill  . acetaZOLAMIDE (DIAMOX) 125 MG tablet TAKE 1 TABLET(125 MG) BY MOUTH DAILY (Patient taking differently: Take 125 mg by mouth daily. ) 30 tablet 5  . buPROPion (WELLBUTRIN XL) 150 MG 24 hr tablet Take 450 mg by mouth every morning.     . carboxymethylcellulose (REFRESH PLUS) 0.5 % SOLN Place 1 drop into both eyes 3 (three) times daily as needed (dry eyes).    . carvedilol (COREG) 3.125 MG tablet Take 3.125 mg by mouth 2 (two) times daily with a meal.    . citalopram (CELEXA) 20 MG tablet Take 20 mg by mouth daily.    . clonazePAM (KLONOPIN) 0.5 MG tablet Take 0.5 mg by mouth 3 (three) times daily as needed for anxiety.     . colestipol (COLESTID) 1 g tablet Take 2 g by mouth daily.     . fexofenadine (ALLEGRA) 180 MG tablet Take 180 mg by mouth 2 (two) times daily.    Marland Kitchen gabapentin (NEURONTIN) 300 MG capsule Take 300 mg by mouth 3 (three) times daily.     Marland Kitchen gabapentin (NEURONTIN) 800 MG tablet Take 800 mg by mouth at bedtime as needed (neuropathy).     Marland Kitchen ipratropium (ATROVENT) 0.03 % nasal spray Place 2 sprays into both nostrils 2 (two) times daily.    Marland Kitchen levothyroxine (SYNTHROID, LEVOTHROID) 100 MCG tablet TAKE 1 TABLET(100 MCG) BY MOUTH DAILY (Patient taking differently: Take 100 mcg by mouth daily. ) 45 tablet 3  . metolazone (ZAROXOLYN) 2.5 MG tablet Take 1 tablet (2.5 mg total) by mouth daily as needed (edema). Take only as directed by CHF clinic    . midodrine ([place])  10 MG tablet Take 1 tablet (10 mg total) by mouth 3 (three) times daily with meals. 90 tablet 3  . montelukast (SINGULAIR) 10 MG tablet Take 10 mg by mouth at bedtime.   1  . potassium chloride SA (K-DUR) 20 MEQ tablet Take 2 tablets (40 mEq total) by mouth 2  (two) times daily. 180 tablet 11  . simvastatin (ZOCOR) 10 MG tablet Take 10 mg by mouth every evening.     Marland Kitchen spironolactone (ALDACTONE) 25 MG tablet Take 0.5 tablets (12.5 mg total) by mouth every evening. 45 tablet 1  . torsemide (DEMADEX) 20 MG tablet Take 60 mg by mouth daily.    Marland Kitchen zolpidem (AMBIEN) 10 MG tablet Take 10 mg by mouth at bedtime as needed for sleep. For sleep      No current facility-administered medications on file prior to visit.    Allergies  Allergen Reactions  . Ceftin Anaphylaxis    Face and throat swell   . Shellfish Allergy Other (See Comments)    Gout exacerbation  . Geodon [Ziprasidone Hcl] Hives  . Lisinopril Other (See Comments)    angioedema  . Allopurinol Nausea Only and Other (See Comments)    weakness  . Ativan [Lorazepam] Itching  . Sulfa Antibiotics Itching  . Ultram [Tramadol Hcl] Itching   Family History  Problem Relation Age of Onset  . Alzheimer's disease Mother   . CVA Mother   . Hypertension Mother   . Cancer - Colon Father        late 54  . Birth defects Paternal Uncle     PE: There were no vitals taken for this visit. Wt Readings from Last 3 Encounters:  12/01/18 170 lb (77.1 kg)  11/24/18 176 lb (79.8 kg)  11/17/18 172 lb (78 kg)   Constitutional:  in NAD  The physical exam was not performed (telephone visit).  ASSESSMENT: 1. Hypothyroidism  2. Eucalcemic hyperparathyroidism -At last visit, we also addressed her hyperparathyroidism.  Her vitamin D level was normal, calcium was low normal and PTH high.  She also has CKD and we discussed about seeing the nephrologist as her hyperparathyroidism appears to be secondary to her CKD.  PLAN:  1. Patient with longstanding hypothyroidism, on levothyroxine therapy.  At last 2 visits, she was not taking levothyroxine correctly.  We discussed at our first visit about taking the levothyroxine by itself in the morning and separating multivitamins and colestipol by at least 4 hours.  A  at this visit, she tells me that she was taken off colestipol. - latest thyroid labs reviewed with pt >> normal 01/2018 but TSH was slightly low 10/2018 - she continues on LT4 100 mcg daily >> will decrease this to 88 mcg daily as her TSH was suppressed recently and she also stopped Colestipol - we discussed about taking the thyroid hormone every day, with water, >30 minutes before breakfast, separated by >4 hours from acid reflux medications, calcium, iron, multivitamins. Pt. is taking it correctly now - will check thyroid tests in 1.5 months after the dose change: TSH and fT4 -I advised her to Patient Instructions  Please decrease levothyroxine to 88 mcg daily  Take the thyroid hormone every day, with water, at least 30 minutes before breakfast, separated by at least 4 hours from: - acid reflux medications - calcium - iron - multivitamins  Please come back for labs in 1.5 months.  Please come back for a follow-up appointment in 6 months.  Orders Placed This Encounter  Procedures  . TSH  . T4, free   - time spent with the patient: 15 min, of which >50% was spent in obtaining information about her symptoms, reviewing her previous labs, evaluations, and treatments, counseling her about her condition (please see the discussed topics above), and developing a plan to further investigate and treat it; she had a number of questions which I addressed.  Philemon Kingdom, MD PhD [facility] Endocrinology

## 2018-12-08 NOTE — Patient Instructions (Signed)
Please continue levothyroxine 100 mcg daily  Take the thyroid hormone every day, with water, at least 30 minutes before breakfast, separated by at least 4 hours from: - acid reflux medications - calcium - iron - multivitamins  Make sure you take colestipol at least 4 hours later.  Make sure you take the multivitamins at night.  Please come back for a follow-up appointment in 6 months.

## 2018-12-08 NOTE — Progress Notes (Signed)
Paramedicine Encounter    Patient ID: Rhonda [name], female    DOB: 09-21-1950, 68 y.o.   MRN: [id number]    Patient Care Team: Maurice Small, MD as PCP - General (Family Medicine) [name], LCSW as Social Worker (Licensed Clinical Social Worker)  Patient Active Problem List   Diagnosis Date Noted  . Hypokalemia 11/08/2018  . CKD (chronic kidney disease) stage 3, GFR 30-59 ml/min (HCC) 11/08/2018  . Pain, dental 08/31/2018  . Orthostatic hypotension 07/28/2017  . SVD (spontaneous vaginal delivery)   . Peripheral neuropathy   . On home oxygen therapy   . Migraines   . Left bundle branch block   . Hypothyroidism   . Hypertension   . Hyperlipidemia   . Heart murmur   . GERD (gastroesophageal reflux disease)   . Exertional shortness of breath   . Depression   . Complication of anesthesia   . Back pain   . Arthritis   . Anxiety   . Anemia   . HTN (hypertension) 09/14/2013  . Chronic respiratory failure ([place]) 09/14/2013  . 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 Morbid obesity ([place]) 11/01/2012  . Chronic systolic heart failure ([place]) 10/27/2012  . S/P right TKA 05/10/2012  . Endometrial polyp 01/20/2012  . Breast cancer, stage 1 ([place]) 03/26/2011  . Unspecified vitamin D deficiency 03/26/2011    Current Outpatient Medications:  .  acetaZOLAMIDE (DIAMOX) 125 MG tablet, TAKE 1 TABLET(125 MG) BY MOUTH DAILY (Patient taking differently: Take 125 mg by mouth daily. ), Disp: 30 tablet, Rfl: 5 .  buPROPion (WELLBUTRIN XL) 150 MG 24 hr tablet, Take 450 mg by mouth every morning. , Disp: , Rfl:  .  carvedilol (COREG) 3.125 MG tablet, Take 3.125 mg by mouth 2 (two) times daily with a meal., Disp: , Rfl:  .  citalopram (CELEXA) 20 MG tablet, Take 20 mg by mouth daily., Disp: , Rfl:  .  gabapentin (NEURONTIN) 300 MG capsule, Take 300 mg by mouth 3 (three) times daily. , Disp: , Rfl:  .  levothyroxine (SYNTHROID) 88 MCG tablet, Take 1 tablet  (88 mcg total) by mouth daily before breakfast., Disp: 45 tablet, Rfl: 3 .  metolazone (ZAROXOLYN) 2.5 MG tablet, Take 1 tablet (2.5 mg total) by mouth daily as needed (edema). Take only as directed by CHF clinic, Disp: , Rfl:  .  midodrine (PROAMATINE) 10 MG tablet, Take 1 tablet (10 mg total) by mouth 3 (three) times daily with meals., Disp: 90 tablet, Rfl: 3 .  potassium chloride SA (K-DUR) 20 MEQ tablet, Take 2 tablets (40 mEq total) by mouth 2 (two) times daily., Disp: 180 tablet, Rfl: 11 .  simvastatin (ZOCOR) 10 MG tablet, Take 10 mg by mouth every evening. , Disp: , Rfl:  .  spironolactone (ALDACTONE) 25 MG tablet, Take 0.5 tablets (12.5 mg total) by mouth every evening., Disp: 45 tablet, Rfl: 1 .  torsemide (DEMADEX) 20 MG tablet, Take 60 mg by mouth daily., Disp: , Rfl:  .  carboxymethylcellulose (REFRESH PLUS) 0.5 % SOLN, Place 1 drop into both eyes 3 (three) times daily as needed (dry eyes)., Disp: , Rfl:  .  clonazePAM (KLONOPIN) 0.5 MG tablet, Take 0.5 mg by mouth 3 (three) times daily as needed for anxiety. , Disp: , Rfl:  .  colestipol (COLESTID) 1 g tablet, Take 2 g by mouth daily. , Disp: , Rfl:  .  fexofenadine (ALLEGRA) 180 MG tablet, Take 180 mg by mouth 2 (two)  times daily., Disp: , Rfl:  .  gabapentin (NEURONTIN) 800 MG tablet, Take 800 mg by mouth at bedtime as needed (neuropathy). , Disp: , Rfl:  .  ipratropium (ATROVENT) 0.03 % nasal spray, Place 2 sprays into both nostrils 2 (two) times daily., Disp: , Rfl:  .  montelukast (SINGULAIR) 10 MG tablet, Take 10 mg by mouth at bedtime. , Disp: , Rfl: 1 .  zolpidem (AMBIEN) 10 MG tablet, Take 10 mg by mouth at bedtime as needed for sleep. For sleep , Disp: , Rfl:  Allergies  Allergen Reactions  . Ceftin Anaphylaxis    Face and throat swell   . Shellfish Allergy Other (See Comments)    Gout exacerbation  . Geodon [Ziprasidone Hcl] Hives  . Lisinopril Other (See Comments)    angioedema  . Allopurinol Nausea Only and Other  (See Comments)    weakness  . Ativan [Lorazepam] Itching  . Sulfa Antibiotics Itching  . Ultram [Tramadol Hcl] Itching     Social History   Socioeconomic History  . Marital status: Married    Spouse name: Not on file  . Number of children: 2  . Years of education: masters  . Highest education level: Not on file  Occupational History  . Not on file  Social Needs  . Financial resource strain: Not on file  . Food insecurity    Worry: Not on file    Inability: Not on file  . Transportation needs    Medical: Not on file    Non-medical: Not on file  Tobacco Use  . Smoking status: Former Smoker    Packs/day: 0.10    Years: 26.00    Pack years: 2.60    Types: Cigarettes    Quit date: 05/06/2007    Years since quitting: 11.6  . Smokeless tobacco: Never Used  Substance and Sexual Activity  . Alcohol use: No  . Drug use: No  . Sexual activity: Yes  Lifestyle  . Physical activity    Days per week: Not on file    Minutes per session: Not on file  . Stress: Not on file  Relationships  . Social Herbalist on phone: Not on file    Gets together: Not on file    Attends religious service: Not on file    Active member of club or organization: Not on file    Attends meetings of clubs or organizations: Not on file    Relationship status: Not on file  . Intimate partner violence    Fear of current or ex partner: Not on file    Emotionally abused: Not on file    Physically abused: Not on file    Forced sexual activity: Not on file  Other Topics Concern  . Not on file  Social History Narrative   Tobacco Use Cigarettes: Former Smoker, Quit in 2008   No Alcohol   No recreational drug use   Diet: Regular/Low Carb   Exercise: None   Occupation: disabled   Education: Research officer, political party, masters   Children: 2   Firearms: No   Therapist, art Use: Always   Former Metallurgist.        Physical Exam Pulmonary:     Effort: Pulmonary effort is normal. No respiratory distress.   Musculoskeletal:        General: No swelling.  Skin:    General: Skin is warm and dry.         Future Appointments  Date  Time Provider [place]  01/02/2019  9:50 AM [facility] DEVICE REMOTES [facility]  01/11/2019  1:30 PM Marzetta Board, DPM [facility]  01/18/2019  7:25 AM [facility] DEVICE REMOTES [facility]  01/18/2019  2:00 PM [facility] LAB [facility] None  06/14/2019 10:00 AM Philemon Kingdom, MD [facility] None     BP 130/70 (BP Location: Right Arm, Patient Position: Sitting, Cuff Size: Large)   Pulse 82   Wt 177 lb (80.3 kg)   BMI 32.37 kg/m   Weight yesterday-177 Last visit weight-176  ATF pt CAO x4; she has no pain at this time. Her legs are still being wrapped weekly. She said that her wrist feels better but she's still wearing the wrist brace and taking tylenol for pain releif. She has no complaints today, no sob, chest pain or dizziness. Pt hasn't had a fall recently.  She's taken all of her medications this week.  Her husband spoke with Dr. Justin Mend last week and was able to reduce the amount of medications that she takes. Mrs. Wrigley has no changes and is uncomfortable with the changes.  rx bottles verified and pill box refilled. I filled two pill boxes for her; the second box was taped,  placed in a freezer bag and placed in the extra pill tub in the dinning room.  Mrs. Geisel has hx of meddling in her pills and forgetting.  I told her that I will be on vacation next and to call the heart failure clinic if she has any concerns.     Medication ordered:  None  Kaicen Desena, EMT Paramedic [phone] 12/09/2018    ACTION: Home visit completed

## 2018-12-09 ENCOUNTER — Telehealth (HOSPITAL_COMMUNITY): Payer: Self-pay

## 2018-12-09 NOTE — Telephone Encounter (Signed)
[facility]. confirmation received.

## 2018-12-13 NOTE — Addendum Note (Signed)
Addended by: Philemon Kingdom on: 12/13/2018 02:10 PM   Modules accepted: Level of Service

## 2018-12-14 DIAGNOSIS — I5022 Chronic systolic (congestive) heart failure: Secondary | ICD-10-CM | POA: Diagnosis not present

## 2018-12-14 DIAGNOSIS — I428 Other cardiomyopathies: Secondary | ICD-10-CM | POA: Diagnosis not present

## 2018-12-14 DIAGNOSIS — I130 Hypertensive heart and chronic kidney disease with heart failure and stage 1 through stage 4 chronic kidney disease, or unspecified chronic kidney disease: Secondary | ICD-10-CM | POA: Diagnosis not present

## 2018-12-14 DIAGNOSIS — N183 Chronic kidney disease, stage 3 (moderate): Secondary | ICD-10-CM | POA: Diagnosis not present

## 2018-12-14 DIAGNOSIS — E1122 Type 2 diabetes mellitus with diabetic chronic kidney disease: Secondary | ICD-10-CM | POA: Diagnosis not present

## 2018-12-14 DIAGNOSIS — I878 Other specified disorders of veins: Secondary | ICD-10-CM | POA: Diagnosis not present

## 2018-12-14 DIAGNOSIS — Z6825 Body mass index (BMI) 25.0-25.9, adult: Secondary | ICD-10-CM | POA: Diagnosis not present

## 2018-12-14 DIAGNOSIS — I951 Orthostatic hypotension: Secondary | ICD-10-CM | POA: Diagnosis not present

## 2018-12-14 DIAGNOSIS — E1142 Type 2 diabetes mellitus with diabetic polyneuropathy: Secondary | ICD-10-CM | POA: Diagnosis not present

## 2018-12-15 DIAGNOSIS — I951 Orthostatic hypotension: Secondary | ICD-10-CM | POA: Diagnosis not present

## 2018-12-15 DIAGNOSIS — Z6825 Body mass index (BMI) 25.0-25.9, adult: Secondary | ICD-10-CM | POA: Diagnosis not present

## 2018-12-15 DIAGNOSIS — N183 Chronic kidney disease, stage 3 (moderate): Secondary | ICD-10-CM | POA: Diagnosis not present

## 2018-12-15 DIAGNOSIS — I428 Other cardiomyopathies: Secondary | ICD-10-CM | POA: Diagnosis not present

## 2018-12-15 DIAGNOSIS — I130 Hypertensive heart and chronic kidney disease with heart failure and stage 1 through stage 4 chronic kidney disease, or unspecified chronic kidney disease: Secondary | ICD-10-CM | POA: Diagnosis not present

## 2018-12-15 DIAGNOSIS — E1122 Type 2 diabetes mellitus with diabetic chronic kidney disease: Secondary | ICD-10-CM | POA: Diagnosis not present

## 2018-12-15 DIAGNOSIS — E1142 Type 2 diabetes mellitus with diabetic polyneuropathy: Secondary | ICD-10-CM | POA: Diagnosis not present

## 2018-12-15 DIAGNOSIS — I878 Other specified disorders of veins: Secondary | ICD-10-CM | POA: Diagnosis not present

## 2018-12-15 DIAGNOSIS — I5022 Chronic systolic (congestive) heart failure: Secondary | ICD-10-CM | POA: Diagnosis not present

## 2018-12-19 ENCOUNTER — Telehealth: Payer: Self-pay | Admitting: Internal Medicine

## 2018-12-19 ENCOUNTER — Telehealth (HOSPITAL_COMMUNITY): Payer: Self-pay

## 2018-12-19 ENCOUNTER — Telehealth (HOSPITAL_COMMUNITY): Payer: Self-pay | Admitting: Cardiology

## 2018-12-19 ENCOUNTER — Other Ambulatory Visit (HOSPITAL_COMMUNITY): Payer: Self-pay

## 2018-12-19 DIAGNOSIS — E1122 Type 2 diabetes mellitus with diabetic chronic kidney disease: Secondary | ICD-10-CM | POA: Diagnosis not present

## 2018-12-19 DIAGNOSIS — I951 Orthostatic hypotension: Secondary | ICD-10-CM | POA: Diagnosis not present

## 2018-12-19 DIAGNOSIS — I428 Other cardiomyopathies: Secondary | ICD-10-CM | POA: Diagnosis not present

## 2018-12-19 DIAGNOSIS — I878 Other specified disorders of veins: Secondary | ICD-10-CM | POA: Diagnosis not present

## 2018-12-19 DIAGNOSIS — E1142 Type 2 diabetes mellitus with diabetic polyneuropathy: Secondary | ICD-10-CM | POA: Diagnosis not present

## 2018-12-19 DIAGNOSIS — I130 Hypertensive heart and chronic kidney disease with heart failure and stage 1 through stage 4 chronic kidney disease, or unspecified chronic kidney disease: Secondary | ICD-10-CM | POA: Diagnosis not present

## 2018-12-19 DIAGNOSIS — Z6825 Body mass index (BMI) 25.0-25.9, adult: Secondary | ICD-10-CM | POA: Diagnosis not present

## 2018-12-19 DIAGNOSIS — I5022 Chronic systolic (congestive) heart failure: Secondary | ICD-10-CM | POA: Diagnosis not present

## 2018-12-19 DIAGNOSIS — N183 Chronic kidney disease, stage 3 (moderate): Secondary | ICD-10-CM | POA: Diagnosis not present

## 2018-12-19 NOTE — Progress Notes (Signed)
Paramedicine Encounter    Patient ID: Rhonda [name], female    DOB: July 26, 1950, 68 y.o.   MRN: [id number]   Patient Care Team: Maurice Small, MD as PCP - General (Family Medicine) [name], LCSW as Social Worker (Licensed Clinical Social Worker)  Patient Active Problem List   Diagnosis Date Noted  . Hypokalemia 11/08/2018  . CKD (chronic kidney disease) stage 3, GFR 30-59 ml/min (HCC) 11/08/2018  . Pain, dental 08/31/2018  . Orthostatic hypotension 07/28/2017  . SVD (spontaneous vaginal delivery)   . Peripheral neuropathy   . On home oxygen therapy   . Migraines   . Left bundle branch block   . Hypothyroidism   . Hypertension   . Hyperlipidemia   . Heart murmur   . GERD (gastroesophageal reflux disease)   . Exertional shortness of breath   . Depression   . Complication of anesthesia   . Back pain   . Arthritis   . Anxiety   . Anemia   . HTN (hypertension) 09/14/2013  . Chronic respiratory failure ([place]) 09/14/2013  . 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 Morbid obesity ([place]) 11/01/2012  . Chronic systolic heart failure ([place]) 10/27/2012  . S/P right TKA 05/10/2012  . Endometrial polyp 01/20/2012  . Breast cancer, stage 1 ([place]) 03/26/2011  . Unspecified vitamin D deficiency 03/26/2011    Current Outpatient Medications:  .  acetaZOLAMIDE (DIAMOX) 125 MG tablet, TAKE 1 TABLET(125 MG) BY MOUTH DAILY (Patient taking differently: Take 125 mg by mouth daily. ), Disp: 30 tablet, Rfl: 5 .  buPROPion (WELLBUTRIN XL) 150 MG 24 hr tablet, Take 450 mg by mouth every morning. , Disp: , Rfl:  .  carboxymethylcellulose (REFRESH PLUS) 0.5 % SOLN, Place 1 drop into both eyes 3 (three) times daily as needed (dry eyes)., Disp: , Rfl:  .  carvedilol (COREG) 3.125 MG tablet, Take 3.125 mg by mouth 2 (two) times daily with a meal., Disp: , Rfl:  .  citalopram (CELEXA) 20 MG tablet, Take 20 mg by mouth daily., Disp: , Rfl:  .  clonazePAM  (KLONOPIN) 0.5 MG tablet, Take 0.5 mg by mouth 3 (three) times daily as needed for anxiety. , Disp: , Rfl:  .  fexofenadine (ALLEGRA) 180 MG tablet, Take 180 mg by mouth 2 (two) times daily., Disp: , Rfl:  .  gabapentin (NEURONTIN) 300 MG capsule, Take 300 mg by mouth 3 (three) times daily. , Disp: , Rfl:  .  ipratropium (ATROVENT) 0.03 % nasal spray, Place 2 sprays into both nostrils 2 (two) times daily., Disp: , Rfl:  .  midodrine (PROAMATINE) 10 MG tablet, Take 1 tablet (10 mg total) by mouth 3 (three) times daily with meals., Disp: 90 tablet, Rfl: 3 .  potassium chloride SA (K-DUR) 20 MEQ tablet, Take 2 tablets (40 mEq total) by mouth 2 (two) times daily., Disp: 180 tablet, Rfl: 11 .  simvastatin (ZOCOR) 10 MG tablet, Take 10 mg by mouth every evening. , Disp: , Rfl:  .  spironolactone (ALDACTONE) 25 MG tablet, Take 0.5 tablets (12.5 mg total) by mouth every evening., Disp: 45 tablet, Rfl: 1 .  torsemide (DEMADEX) 20 MG tablet, Take 60 mg by mouth daily., Disp: , Rfl:  .  zolpidem (AMBIEN) 10 MG tablet, Take 10 mg by mouth at bedtime as needed for sleep. For sleep , Disp: , Rfl:  .  colestipol (COLESTID) 1 g tablet, Take 2 g by mouth daily. , Disp: , Rfl:  .  gabapentin (NEURONTIN) 800 MG tablet, Take 800 mg by mouth at bedtime as needed (neuropathy). , Disp: , Rfl:  .  levothyroxine (SYNTHROID) 88 MCG tablet, Take 1 tablet (88 mcg total) by mouth daily before breakfast., Disp: 45 tablet, Rfl: 3 .  metolazone (ZAROXOLYN) 2.5 MG tablet, Take 1 tablet (2.5 mg total) by mouth daily as needed (edema). Take only as directed by CHF clinic (Patient not taking: Reported on 12/19/2018), Disp: , Rfl:  .  montelukast (SINGULAIR) 10 MG tablet, Take 10 mg by mouth at bedtime. , Disp: , Rfl: 1 Allergies  Allergen Reactions  . Ceftin Anaphylaxis    Face and throat swell   . Shellfish Allergy Other (See Comments)    Gout exacerbation  . Geodon [Ziprasidone Hcl] Hives  . Lisinopril Other (See Comments)     angioedema  . Allopurinol Nausea Only and Other (See Comments)    weakness  . Ativan [Lorazepam] Itching  . Sulfa Antibiotics Itching  . Ultram [Tramadol Hcl] Itching     Social History   Socioeconomic History  . Marital status: Married    Spouse name: Not on file  . Number of children: 2  . Years of education: masters  . Highest education level: Not on file  Occupational History  . Not on file  Social Needs  . Financial resource strain: Not on file  . Food insecurity    Worry: Not on file    Inability: Not on file  . Transportation needs    Medical: Not on file    Non-medical: Not on file  Tobacco Use  . Smoking status: Former Smoker    Packs/day: 0.10    Years: 26.00    Pack years: 2.60    Types: Cigarettes    Quit date: 05/06/2007    Years since quitting: 11.6  . Smokeless tobacco: Never Used  Substance and Sexual Activity  . Alcohol use: No  . Drug use: No  . Sexual activity: Yes  Lifestyle  . Physical activity    Days per week: Not on file    Minutes per session: Not on file  . Stress: Not on file  Relationships  . Social Herbalist on phone: Not on file    Gets together: Not on file    Attends religious service: Not on file    Active member of club or organization: Not on file    Attends meetings of clubs or organizations: Not on file    Relationship status: Not on file  . Intimate partner violence    Fear of current or ex partner: Not on file    Emotionally abused: Not on file    Physically abused: Not on file    Forced sexual activity: Not on file  Other Topics Concern  . Not on file  Social History Narrative   Tobacco Use Cigarettes: Former Smoker, Quit in 2008   No Alcohol   No recreational drug use   Diet: Regular/Low Carb   Exercise: None   Occupation: disabled   Education: Research officer, political party, masters   Children: 2   Firearms: No   Therapist, art Use: Always   Former Metallurgist.        Physical Exam Vitals signs reviewed.   Constitutional:      Appearance: Normal appearance.  HENT:     Head: Normocephalic.     Nose: Nose normal.     Mouth/Throat:     Mouth: Mucous membranes are dry.  Eyes:  Pupils: Pupils are equal, round, and reactive to light.  Neck:     Musculoskeletal: Normal range of motion.  Cardiovascular:     Rate and Rhythm: Normal rate and regular rhythm.     Pulses: Normal pulses.     Heart sounds: Normal heart sounds.  Pulmonary:     Effort: Pulmonary effort is normal.     Breath sounds: Normal breath sounds.  Abdominal:     General: Abdomen is flat.     Palpations: Abdomen is soft.  Musculoskeletal: Normal range of motion.        General: Swelling and tenderness present.     Right lower leg: Edema present.     Left lower leg: Edema present.  Skin:    General: Skin is warm and dry.     Capillary Refill: Capillary refill takes less than 2 seconds.  Neurological:     Mental Status: She is alert. Mental status is at baseline.  Psychiatric:        Mood and Affect: Mood normal.     Arrived for home visit for Rhonda [name]  home visit. Rhonda [name] had no complaints other than her normal leg pain. Patient denied chest pain, shortness of breath, dizziness or trouble sleeping. Rhonda [name]'s legs were wrapped with gauze and ace bandages, Rhonda [name] reports them being wrapped by her home health nurse. Rhonda [name] weighed in at 184.3lbs today. Medications were reviewed and verified. In a recent AVS from 7/16 Levothyroxine dose was 187mcg, however her epic list states 49mcg.Patient has mostly 77mcg in prescription bottle but has some 135mcg as well.  I contacted Dr. Arman Filter office in regards to same. They stated a nurse would follow up. Since this medication isn't in a pill box I left the pill bottles on the counter and instructed the patient to continue taking 5mcg until told otherwise by me. Patient understood same. Other medications were verified and filled. 2 pill boxes filled for patient. Vitals  were obtained and are as noted in report. Patient agreed with [facility] following up upon her return next week. Home visit complete.    Future Appointments  Date Time Provider [place]  01/02/2019  9:50 AM [facility] DEVICE REMOTES [facility]  01/11/2019  1:30 PM Marzetta Board, DPM [facility]  01/18/2019  7:25 AM [facility] DEVICE REMOTES [facility]  01/18/2019  2:00 PM [facility] LAB [facility] None  06/14/2019 10:00 AM Philemon Kingdom, MD [facility] None     ACTION: Home visit completed Next visit planned for Follow up with [facility] upon her return to work.

## 2018-12-19 NOTE — Telephone Encounter (Signed)
Jeris Penta (caregiver who organizes patient's medication) ph# [phone] called re: patient has latest RX of Levothyroxine .71mcg but on After Visit Summary dated 12/08/18 -AVS states for patient to continue taking Levothyroxine 100 mcg but patient only has Levothyroxine .88 mcg. Please call [name] at the above ph# to advise.

## 2018-12-19 NOTE — Telephone Encounter (Signed)
Angel with [facility] called during patients [place] visit Weight up on home scale ~12 lbs +ab swelling and BLE Patient did admit to eating some beef stir fry and dessert in her sleep last night in her sleep No missed doses of medications   Per VO [name],NP Ok for patient to take a prn 2.5 mg of metolazone today  Angel,RN aware [phone]

## 2018-12-19 NOTE — Telephone Encounter (Signed)
Spoke to Mrs. Ridener who agreed to home visit today. Will follow up with home encounter.

## 2018-12-19 NOTE — Telephone Encounter (Signed)
Patient called to inform she received her info from [place] in the mail.

## 2018-12-20 ENCOUNTER — Telehealth (HOSPITAL_COMMUNITY): Payer: Self-pay

## 2018-12-20 NOTE — Telephone Encounter (Signed)
Called pt's caregiver and left detailed voicemail about medication dosage. Upon review of pt's last office note, Dr. Cruzita Lederer stated in bold letters to decrease dose of levothyroxine from 186mcg to 38mcg daily.

## 2018-12-20 NOTE — Telephone Encounter (Signed)
Spoke to Rhonda [name] and confirmed 29mcg dose for Levothyroxine according to RN with Endocrinology office who contacted me this morning. Patient understood and agreed to same.

## 2018-12-21 ENCOUNTER — Other Ambulatory Visit: Payer: Self-pay

## 2018-12-21 ENCOUNTER — Other Ambulatory Visit (INDEPENDENT_AMBULATORY_CARE_PROVIDER_SITE_OTHER): Payer: Medicare HMO

## 2018-12-21 DIAGNOSIS — E039 Hypothyroidism, unspecified: Secondary | ICD-10-CM | POA: Diagnosis not present

## 2018-12-22 ENCOUNTER — Telehealth (HOSPITAL_COMMUNITY): Payer: Self-pay

## 2018-12-22 LAB — TSH: TSH: 0.26 u[IU]/mL — ABNORMAL LOW (ref 0.35–4.50)

## 2018-12-22 LAB — T4, FREE: Free T4: 0.64 ng/dL (ref 0.60–1.60)

## 2018-12-22 NOTE — Telephone Encounter (Signed)
Received call [facility] RN asking for verbal order to continue services for the next 9 weeks beginning 12/27/18. Verbal order given to continue [name] services.

## 2018-12-23 ENCOUNTER — Telehealth (HOSPITAL_COMMUNITY): Payer: Self-pay

## 2018-12-23 NOTE — Telephone Encounter (Signed)
Pt called tearful concerned that her weight continues to go up despite medication therapy. She had a 1lb weight gain over night. Per [name] NP-C fit in next week. Spoke to pt. Pt appreciative.

## 2018-12-26 DIAGNOSIS — I130 Hypertensive heart and chronic kidney disease with heart failure and stage 1 through stage 4 chronic kidney disease, or unspecified chronic kidney disease: Secondary | ICD-10-CM | POA: Diagnosis not present

## 2018-12-26 DIAGNOSIS — I5022 Chronic systolic (congestive) heart failure: Secondary | ICD-10-CM | POA: Diagnosis not present

## 2018-12-26 DIAGNOSIS — I951 Orthostatic hypotension: Secondary | ICD-10-CM | POA: Diagnosis not present

## 2018-12-26 DIAGNOSIS — I878 Other specified disorders of veins: Secondary | ICD-10-CM | POA: Diagnosis not present

## 2018-12-26 DIAGNOSIS — Z6825 Body mass index (BMI) 25.0-25.9, adult: Secondary | ICD-10-CM | POA: Diagnosis not present

## 2018-12-26 DIAGNOSIS — N183 Chronic kidney disease, stage 3 (moderate): Secondary | ICD-10-CM | POA: Diagnosis not present

## 2018-12-26 DIAGNOSIS — E1122 Type 2 diabetes mellitus with diabetic chronic kidney disease: Secondary | ICD-10-CM | POA: Diagnosis not present

## 2018-12-26 DIAGNOSIS — I428 Other cardiomyopathies: Secondary | ICD-10-CM | POA: Diagnosis not present

## 2018-12-26 DIAGNOSIS — E1142 Type 2 diabetes mellitus with diabetic polyneuropathy: Secondary | ICD-10-CM | POA: Diagnosis not present

## 2018-12-27 ENCOUNTER — Telehealth: Payer: Self-pay | Admitting: Internal Medicine

## 2018-12-27 ENCOUNTER — Other Ambulatory Visit: Payer: Self-pay

## 2018-12-27 DIAGNOSIS — E039 Hypothyroidism, unspecified: Secondary | ICD-10-CM

## 2018-12-27 NOTE — Telephone Encounter (Signed)
Not necessary to send note

## 2018-12-28 ENCOUNTER — Ambulatory Visit (HOSPITAL_COMMUNITY)
Admission: RE | Admit: 2018-12-28 | Discharge: 2018-12-28 | Disposition: A | Discharge status: Home / Self Care | Payer: Medicare HMO | Source: Ambulatory Visit | Attending: Internal Medicine | Admitting: Internal Medicine

## 2018-12-28 ENCOUNTER — Other Ambulatory Visit: Payer: Self-pay

## 2018-12-28 ENCOUNTER — Other Ambulatory Visit (HOSPITAL_COMMUNITY): Payer: Self-pay

## 2018-12-28 ENCOUNTER — Encounter (HOSPITAL_COMMUNITY): Payer: Self-pay

## 2018-12-28 VITALS — BP 124/75 | HR 75 | Wt 178.2 lb

## 2018-12-28 DIAGNOSIS — F329 Major depressive disorder, single episode, unspecified: Secondary | ICD-10-CM | POA: Diagnosis not present

## 2018-12-28 DIAGNOSIS — E669 Obesity, unspecified: Secondary | ICD-10-CM | POA: Insufficient documentation

## 2018-12-28 DIAGNOSIS — I428 Other cardiomyopathies: Secondary | ICD-10-CM | POA: Insufficient documentation

## 2018-12-28 DIAGNOSIS — E559 Vitamin D deficiency, unspecified: Secondary | ICD-10-CM | POA: Insufficient documentation

## 2018-12-28 DIAGNOSIS — N183 Chronic kidney disease, stage 3 unspecified: Secondary | ICD-10-CM

## 2018-12-28 DIAGNOSIS — I951 Orthostatic hypotension: Secondary | ICD-10-CM | POA: Diagnosis not present

## 2018-12-28 DIAGNOSIS — Z923 Personal history of irradiation: Secondary | ICD-10-CM | POA: Insufficient documentation

## 2018-12-28 DIAGNOSIS — Z6832 Body mass index (BMI) 32.0-32.9, adult: Secondary | ICD-10-CM | POA: Diagnosis not present

## 2018-12-28 DIAGNOSIS — Z9221 Personal history of antineoplastic chemotherapy: Secondary | ICD-10-CM | POA: Diagnosis not present

## 2018-12-28 DIAGNOSIS — M109 Gout, unspecified: Secondary | ICD-10-CM | POA: Diagnosis not present

## 2018-12-28 DIAGNOSIS — E785 Hyperlipidemia, unspecified: Secondary | ICD-10-CM | POA: Diagnosis not present

## 2018-12-28 DIAGNOSIS — E039 Hypothyroidism, unspecified: Secondary | ICD-10-CM | POA: Insufficient documentation

## 2018-12-28 DIAGNOSIS — J45909 Unspecified asthma, uncomplicated: Secondary | ICD-10-CM | POA: Diagnosis not present

## 2018-12-28 DIAGNOSIS — Z9181 History of falling: Secondary | ICD-10-CM | POA: Insufficient documentation

## 2018-12-28 DIAGNOSIS — Z79890 Hormone replacement therapy (postmenopausal): Secondary | ICD-10-CM | POA: Diagnosis not present

## 2018-12-28 DIAGNOSIS — I878 Other specified disorders of veins: Secondary | ICD-10-CM | POA: Diagnosis not present

## 2018-12-28 DIAGNOSIS — Z79899 Other long term (current) drug therapy: Secondary | ICD-10-CM | POA: Insufficient documentation

## 2018-12-28 DIAGNOSIS — K219 Gastro-esophageal reflux disease without esophagitis: Secondary | ICD-10-CM | POA: Diagnosis not present

## 2018-12-28 DIAGNOSIS — I130 Hypertensive heart and chronic kidney disease with heart failure and stage 1 through stage 4 chronic kidney disease, or unspecified chronic kidney disease: Secondary | ICD-10-CM | POA: Diagnosis not present

## 2018-12-28 DIAGNOSIS — E1122 Type 2 diabetes mellitus with diabetic chronic kidney disease: Secondary | ICD-10-CM | POA: Insufficient documentation

## 2018-12-28 DIAGNOSIS — E1142 Type 2 diabetes mellitus with diabetic polyneuropathy: Secondary | ICD-10-CM | POA: Insufficient documentation

## 2018-12-28 DIAGNOSIS — I5022 Chronic systolic (congestive) heart failure: Secondary | ICD-10-CM | POA: Diagnosis not present

## 2018-12-28 DIAGNOSIS — Z853 Personal history of malignant neoplasm of breast: Secondary | ICD-10-CM | POA: Diagnosis not present

## 2018-12-28 DIAGNOSIS — Z9981 Dependence on supplemental oxygen: Secondary | ICD-10-CM | POA: Insufficient documentation

## 2018-12-28 DIAGNOSIS — F419 Anxiety disorder, unspecified: Secondary | ICD-10-CM | POA: Insufficient documentation

## 2018-12-28 DIAGNOSIS — Z95810 Presence of automatic (implantable) cardiac defibrillator: Secondary | ICD-10-CM | POA: Insufficient documentation

## 2018-12-28 MED ORDER — TORSEMIDE 20 MG PO TABS: 80.0000 mg | ORAL_TABLET | Freq: Every day | ORAL | 6 refills | Status: DC

## 2018-12-28 NOTE — Patient Instructions (Signed)
Lab work needs to be done in 10 days.   INCREASE Torsemide to 80mg  (4 tabs) daily.  Please follow up with the [facility] in 2 months.   At the [facility], you and your health needs are our priority. As part of our continuing mission to provide you with exceptional heart care, we have created designated Provider Care Teams. These Care Teams include your primary Cardiologist (physician) and Advanced Practice Providers (APPs- Physician Assistants and Nurse Practitioners) who all work together to provide you with the care you need, when you need it.   You may see any of the following providers on your designated Care Team at your next follow up: Marland Kitchen Dr Glori Bickers . Dr Loralie Champagne . Darrick Grinder, NP   Please be sure to bring in all your medications bottles to every appointment.

## 2018-12-28 NOTE — Progress Notes (Signed)
ADVANCED HF CLINIC NOTE  Patient ID: Rhonda [name], female   DOB: 10-Jun-1950, 68 y.o.   MRN: [id number]  Primary Cardiologist: Dr Radford Pax  General Surgeon: Dr Donne Hazel  Orthopedic: Dr Alvan Dame  PCP: Dr Justin Mend Referring MD: Dr Radford Pax  HPI: Rhonda [name] is a 68 y.o. 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 She has had sleep study with no  evidence of sleep apnea.  She has a Medtronic CRT-D device.  Last echo in 7/18 showed recovery of EF to 55-60%.   Today she presents for HF follow up. Ongoing struggle with fluid management at home. Overall feeling fine. SOB after walking 1 block. Denies PND/Orthopnea. Appetite ok. Drinking lot of fluids. No fever or chills. Weight at home 178-181  pounds. Taking all medications. Followed by HF Paramedicine.   Optivol: Reading did not go through.   10/14/12 ECHO EF 35% RV ok  04/12/13 ECHO EF 20-25%, LV moderately dilated 10/11/2013 ECHO EF 45-50% 7/18 ECHO EF 55-60%, normal RV size and systolic function, PASP 34 mmHg  11/24/12 Creatinine 1.23 Potassium 4.1 12/15/12 Creatinine 2.03 Potassium 3.3  12/22/12 Creatinine 1.2 Potassium 3.8 02/23/13 Creatinine 1.68 K 4.4  05/02/13 K 4.2 Creatinine 1.8  10/11/13 K 3.7 Creatinine 1.69 10/17 K 4.1, creatinine 1.35 09/2017: K3.8 Creatinine 1.57  Review of systems complete and found to be negative unless listed in HPI.   Past Medical History:  Diagnosis Date  . Anemia   . Anxiety   . Arthritis    RIGHT KNEE ARTHRITIS AND PAIN-PT TOLD BONE ON BONE  . Asthma   . Back pain    DISK PROBLEM  . Breast cancer, stage 1 ([place]) 03/26/2011   left-FINISHED CHEMO  AND RADIATION  . Cardiomyopathy    [name]  . Chronic systolic heart failure (HCC)    a) NICM b) ECHO (03/2013) EF 20-25% c) ECHO (09/2013) EF 45-50%, grade I DD  . Complication of anesthesia    PT STATES HER B/P LOW AFTER ONE OF HER SURGERIES--SHE ATTRIBUTES TO LYING FLAT  . Depression   . Diabetes mellitus    "diet controlled" (05/04/2103)  . Exertional shortness of breath   . GERD (gastroesophageal reflux disease)   . Gout   . Heart murmur   . Hyperlipidemia   . Hypertension   . Hypothyroidism   . Left bundle branch block    s/p CRT-D (04/2013)  . Migraines   . On home oxygen therapy    "2L suppose to be q night" (05/03/2013)  . Peripheral neuropathy    feet  . SVD (spontaneous vaginal delivery)    x 2  . Unspecified vitamin D deficiency 03/26/2011   does not take meds    Current Outpatient Medications  Medication Sig Dispense Refill  . acetaZOLAMIDE (DIAMOX) 125 MG tablet TAKE 1 TABLET(125 MG) BY MOUTH DAILY (Patient taking differently: Take 125 mg by mouth daily. ) 30 tablet 5  . buPROPion (WELLBUTRIN XL) 150 MG 24 hr tablet Take 450 mg by mouth every morning.     . carboxymethylcellulose (REFRESH PLUS) 0.5 % SOLN Place 1 drop into both eyes 3 (three) times daily as needed (dry eyes).    Marland Kitchen  carvedilol (COREG) 3.125 MG tablet Take 3.125 mg by mouth 2 (two) times daily with a meal.    . citalopram (CELEXA) 20 MG tablet Take 20 mg by mouth daily.    . clonazePAM (KLONOPIN) 0.5 MG tablet Take 0.5 mg by mouth 3 (three) times daily as needed for anxiety.     . fexofenadine (ALLEGRA) 180 MG tablet Take 180 mg by mouth 2 (two) times daily.    Marland Kitchen gabapentin (NEURONTIN) 300 MG capsule Take 300 mg by mouth 3 (three) times daily.     Marland Kitchen gabapentin (NEURONTIN) 800 MG tablet Take 800 mg by mouth at bedtime as needed (neuropathy).     Marland Kitchen ipratropium (ATROVENT) 0.03 % nasal spray Place 2 sprays into both nostrils 2 (two) times daily.    Marland Kitchen levothyroxine (SYNTHROID) 88 MCG tablet Take 1 tablet (88  mcg total) by mouth daily before breakfast. 45 tablet 3  . metolazone (ZAROXOLYN) 2.5 MG tablet Take 1 tablet (2.5 mg total) by mouth daily as needed (edema). Take only as directed by CHF clinic    . midodrine (PROAMATINE) 10 MG tablet Take 1 tablet (10 mg total) by mouth 3 (three) times daily with meals. 90 tablet 3  . montelukast (SINGULAIR) 10 MG tablet Take 10 mg by mouth at bedtime.   1  . potassium chloride SA (K-DUR) 20 MEQ tablet Take 2 tablets (40 mEq total) by mouth 2 (two) times daily. 180 tablet 11  . simvastatin (ZOCOR) 10 MG tablet Take 10 mg by mouth every evening.     Marland Kitchen spironolactone (ALDACTONE) 25 MG tablet Take 0.5 tablets (12.5 mg total) by mouth every evening. 45 tablet 1  . torsemide (DEMADEX) 20 MG tablet Take 60 mg by mouth daily.    Marland Kitchen zolpidem (AMBIEN) 10 MG tablet Take 10 mg by mouth at bedtime as needed for sleep. For sleep      No current facility-administered medications for this encounter.     Vitals:   12/28/18 1505  BP: 124/75  Pulse: 75  SpO2: 99%  Weight: 80.8 kg (178 lb 3.2 oz)     Wt Readings from Last 3 Encounters:  12/28/18 80.8 kg (178 lb 3.2 oz)  12/19/18 83.6 kg (184 lb 4.8 oz)  12/08/18 80.3 kg (177 lb)    PHYSICAL EXAM: General:  Well appearing. No resp difficulty HEENT: normal Neck: supple. JVP 8-9  Carotids 2+ bilat; no bruits. No lymphadenopathy or thryomegaly appreciated. Cor: PMI nondisplaced. Regular rate & rhythm. No rubs, gallops or murmurs. Lungs: clear Abdomen: soft, nontender, nondistended. No hepatosplenomegaly. No bruits or masses. Good bowel sounds. Extremities: no cyanosis, clubbing, rash, R and LLE unna boots  Neuro: alert & orientedx3, cranial nerves grossly intact. moves all 4 extremities w/o difficulty. Affect pleasant  ASSESSMENT & PLAN:  1) Chronic systolic HF: NICM s/p Medtronic CRT-D, ?Viral myocarditis. Cardiomyopathy dates from 32. EF 45-50% (09/2013), EF up to 55-60% on 20202.  PYP 09/02/17 negative for TTR  (Grade 0-1, H/CCL 1.2). SPEP with no M-spike.  -NYHA III. Mildly overloaded. Increase torsemide ot 80 mg daily. Check BMEt next week. Continue compression wraps.  -Continue low dose carvedilol.  - Continue spiro 12.5 mg daily.  -  Continue HF paramedicine.  2) Obesity -Body mass index is 32.59 kg/m. Discussed portion control  3) HTN Stable.  4) Orthostatic Hypotension.  - Neurology following. Gabapentin decreased. She is also on diamox for increased ICP - Continue midodrine to 10 mg TID.  5) Chronic Venous stasis -Refer  back to [facility] for unna boots.  6) Falls  No recent falls.  7) CKD 3 - Creatinine baseline 1.4-1.6 -Check BMET in 10 days.    Follow up in 2 months.     Courtez Twaddle NP-C  3:19 PM

## 2018-12-28 NOTE — Progress Notes (Signed)
Paramedicine Encounter   Patient ID: Rhonda [name] , female,   DOB: April 23, 1951,68 y.o.,  MRN: [id number]   Met patient in clinic today with provider Amy.   She was worried about fluid retention in her legs, however Amy said that she looks great. Her lung sounds clear. Pt is still remaining active by walking her dog.  Mr. Fesler said that pt is not elevating her legs throughout the day. Amy said that her legs look a lot better than last visit.  Pt need a refill for clonazepam; her husband said that there's no more refills.  He will take [name] by the doctors office when they leave this appointment.  I will re-visit pt tomorrow around 3.    Medication change: Increase torsemide 80 BID  Time spent with patient: [place], [place] 12/28/2018   ACTION: Home visit completed

## 2018-12-29 ENCOUNTER — Other Ambulatory Visit (HOSPITAL_COMMUNITY): Payer: Self-pay

## 2018-12-29 NOTE — Progress Notes (Signed)
[facility]  She forgot her pill boxes at home and there were medication change during that visit.  Today's visit is a re-visit to refill box pill boxes.

## 2019-01-02 ENCOUNTER — Ambulatory Visit (INDEPENDENT_AMBULATORY_CARE_PROVIDER_SITE_OTHER): Payer: Medicare HMO

## 2019-01-02 ENCOUNTER — Telehealth: Payer: Self-pay

## 2019-01-02 DIAGNOSIS — I5022 Chronic systolic (congestive) heart failure: Secondary | ICD-10-CM

## 2019-01-02 DIAGNOSIS — Z95810 Presence of automatic (implantable) cardiac defibrillator: Secondary | ICD-10-CM

## 2019-01-02 NOTE — Progress Notes (Signed)
EPIC Encounter for ICM Monitoring  Patient Name: Rhonda [name] is a 68 y.o. female Date: 01/02/2019 Primary Care Physican: Maurice Small, MD Primary Cardiologist:Bensimhon Electrophysiologist: Santina Evans Pacing: 100% 12/28/2018 Weight: 178 lbs(at office visit)   Attempted call to patient and unable to reach.  Transmission reviewed.    8/5 OV with Dr Haroldine Laws and Torsemide increased to 80 mg daily.    Optivol thoracic impedancecontinues to suggest possible ongoing fluid accumulation since February.  Changes in diuretic doses and taken Metolazone in the past has does not show change in thoracic impedance and fluid index remains > 200 above normal threshold.   Prescribed:Torsemide 20 mg4tablets (80 mg total)by mouthdaily (increaed 12/28/2018).Potassium 20 meq 2tablets (40 mEq total)twice a day. Metolazone 2.5mg  1 tablet by mouth if needed for fluid or edema.  Labs: 11/10/2018 Creatinine 1.21, BUN 25, Potassium 3.9, Sodium 138, GFR 47-54 11/09/2018 Creatinine 1.53, BUN 22, Potassium 2.9, Sodium 142, GFR 35-41  11/08/2018 Creatinine 1.75, BUN 25, Potassium <2.0, Sodium 141, GFR 30-35  10/27/2018 Creatinine 1.44, BUN 15, Potassium 5.2, Sodium 142, GFR 38-44  A complete set of results can be found in Results Review.  Recommendations:Unable to reach.  Follow-up plan: ICM clinic phone appointment on9/14/2020.  Copy of ICM check sent to Dr.Taylor and Dr Haroldine Laws.  3 month ICM trend: 01/02/2019    1 Year ICM trend:       [name], RN 01/02/2019 1:32 PM

## 2019-01-02 NOTE — Telephone Encounter (Signed)
Remote ICM transmission received.  Attempted call to patient regarding ICM remote transmission and no answer.  

## 2019-01-10 DIAGNOSIS — F0632 Mood disorder due to known physiological condition with major depressive-like episode: Secondary | ICD-10-CM | POA: Diagnosis not present

## 2019-01-11 ENCOUNTER — Ambulatory Visit: Payer: Medicare HMO | Admitting: Podiatry

## 2019-01-11 ENCOUNTER — Other Ambulatory Visit: Payer: Self-pay

## 2019-01-11 ENCOUNTER — Ambulatory Visit (HOSPITAL_COMMUNITY)
Admission: RE | Admit: 2019-01-11 | Discharge: 2019-01-11 | Disposition: A | Discharge status: Home / Self Care | Payer: Medicare HMO | Source: Ambulatory Visit | Attending: Internal Medicine | Admitting: Internal Medicine

## 2019-01-11 DIAGNOSIS — I5022 Chronic systolic (congestive) heart failure: Secondary | ICD-10-CM | POA: Diagnosis not present

## 2019-01-11 LAB — BASIC METABOLIC PANEL
Anion gap: 11 (ref 5–15)
BUN: 35 mg/dL — ABNORMAL HIGH (ref 8–23)
CO2: 24 mmol/L (ref 22–32)
Calcium: 9.2 mg/dL (ref 8.9–10.3)
Chloride: 105 mmol/L (ref 98–111)
Creatinine, Ser: 1.84 mg/dL — ABNORMAL HIGH (ref 0.44–1.00)
GFR calc Af Amer: 33 mL/min — ABNORMAL LOW (ref >60)
GFR calc non Af Amer: 28 mL/min — ABNORMAL LOW (ref >60)
Glucose, Bld: 102 mg/dL — ABNORMAL HIGH (ref 70–99)
Potassium: 4.2 mmol/L (ref 3.5–5.1)
Sodium: 140 mmol/L (ref 135–145)

## 2019-01-12 ENCOUNTER — Other Ambulatory Visit (HOSPITAL_COMMUNITY): Payer: Self-pay

## 2019-01-12 NOTE — Progress Notes (Signed)
Paramedicine Encounter    Patient ID: Rhonda [name], female    DOB: 1951-05-02, 68 y.o.   MRN: [id number]    Patient Care Team: Maurice Small, MD as PCP - General (Family Medicine) [name], LCSW as Social Worker (Licensed Clinical Social Worker)  Patient Active Problem List   Diagnosis Date Noted  . Hypokalemia 11/08/2018  . CKD (chronic kidney disease) stage 3, GFR 30-59 ml/min (HCC) 11/08/2018  . Pain, dental 08/31/2018  . Orthostatic hypotension 07/28/2017  . SVD (spontaneous vaginal delivery)   . Peripheral neuropathy   . On home oxygen therapy   . Migraines   . Left bundle branch block   . Hypothyroidism   . Hypertension   . Hyperlipidemia   . Heart murmur   . GERD (gastroesophageal reflux disease)   . Exertional shortness of breath   . Depression   . Complication of anesthesia   . Back pain   . Arthritis   . Anxiety   . Anemia   . HTN (hypertension) 09/14/2013  . Chronic respiratory failure ([place]) 09/14/2013  . 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 Morbid obesity ([place]) 11/01/2012  . Chronic systolic heart failure ([place]) 10/27/2012  . S/P right TKA 05/10/2012  . Endometrial polyp 01/20/2012  . Breast cancer, stage 1 ([place]) 03/26/2011  . Unspecified vitamin D deficiency 03/26/2011    Current Outpatient Medications:  .  buPROPion (WELLBUTRIN XL) 150 MG 24 hr tablet, Take 450 mg by mouth every morning. , Disp: , Rfl:  .  carvedilol (COREG) 3.125 MG tablet, Take 3.125 mg by mouth 2 (two) times daily with a meal., Disp: , Rfl:  .  citalopram (CELEXA) 20 MG tablet, Take 20 mg by mouth daily., Disp: , Rfl:  .  clonazePAM (KLONOPIN) 0.5 MG tablet, Take 0.5 mg by mouth 3 (three) times daily as needed for anxiety. , Disp: , Rfl:  .  levothyroxine (SYNTHROID) 88 MCG tablet, Take 1 tablet (88 mcg total) by mouth daily before breakfast., Disp: 45 tablet, Rfl: 3 .  montelukast (SINGULAIR) 10 MG tablet, Take 10 mg by mouth at  bedtime. , Disp: , Rfl: 1 .  potassium chloride SA (K-DUR) 20 MEQ tablet, Take 2 tablets (40 mEq total) by mouth 2 (two) times daily., Disp: 180 tablet, Rfl: 11 .  simvastatin (ZOCOR) 10 MG tablet, Take 10 mg by mouth every evening. , Disp: , Rfl:  .  spironolactone (ALDACTONE) 25 MG tablet, Take 0.5 tablets (12.5 mg total) by mouth every evening., Disp: 45 tablet, Rfl: 1 .  torsemide (DEMADEX) 20 MG tablet, Take 4 tablets (80 mg total) by mouth daily., Disp: 120 tablet, Rfl: 6 .  zolpidem (AMBIEN) 10 MG tablet, Take 10 mg by mouth at bedtime as needed for sleep. For sleep , Disp: , Rfl:  .  acetaZOLAMIDE (DIAMOX) 125 MG tablet, TAKE 1 TABLET(125 MG) BY MOUTH DAILY (Patient taking differently: Take 125 mg by mouth daily. ), Disp: 30 tablet, Rfl: 5 .  carboxymethylcellulose (REFRESH PLUS) 0.5 % SOLN, Place 1 drop into both eyes 3 (three) times daily as needed (dry eyes)., Disp: , Rfl:  .  fexofenadine (ALLEGRA) 180 MG tablet, Take 180 mg by mouth 2 (two) times daily., Disp: , Rfl:  .  gabapentin (NEURONTIN) 300 MG capsule, Take 300 mg by mouth 3 (three) times daily. , Disp: , Rfl:  .  gabapentin (NEURONTIN) 800 MG tablet, Take 800 mg by mouth at bedtime as needed (neuropathy). , Disp: ,  Rfl:  .  ipratropium (ATROVENT) 0.03 % nasal spray, Place 2 sprays into both nostrils 2 (two) times daily., Disp: , Rfl:  .  metolazone (ZAROXOLYN) 2.5 MG tablet, Take 1 tablet (2.5 mg total) by mouth daily as needed (edema). Take only as directed by CHF clinic, Disp: , Rfl:  .  midodrine (PROAMATINE) 10 MG tablet, Take 1 tablet (10 mg total) by mouth 3 (three) times daily with meals., Disp: 90 tablet, Rfl: 3 Allergies  Allergen Reactions  . Ceftin Anaphylaxis    Face and throat swell   . Shellfish Allergy Other (See Comments)    Gout exacerbation  . Geodon [Ziprasidone Hcl] Hives  . Lisinopril Other (See Comments)    angioedema  . Allopurinol Nausea Only and Other (See Comments)    weakness  . Ativan  [Lorazepam] Itching  . Sulfa Antibiotics Itching  . Ultram [Tramadol Hcl] Itching     Social History   Socioeconomic History  . Marital status: Married    Spouse name: Not on file  . Number of children: 2  . Years of education: masters  . Highest education level: Not on file  Occupational History  . Not on file  Social Needs  . Financial resource strain: Not on file  . Food insecurity    Worry: Not on file    Inability: Not on file  . Transportation needs    Medical: Not on file    Non-medical: Not on file  Tobacco Use  . Smoking status: Former Smoker    Packs/day: 0.10    Years: 26.00    Pack years: 2.60    Types: Cigarettes    Quit date: 05/06/2007    Years since quitting: 11.6  . Smokeless tobacco: Never Used  Substance and Sexual Activity  . Alcohol use: No  . Drug use: No  . Sexual activity: Yes  Lifestyle  . Physical activity    Days per week: Not on file    Minutes per session: Not on file  . Stress: Not on file  Relationships  . Social Herbalist on phone: Not on file    Gets together: Not on file    Attends religious service: Not on file    Active member of club or organization: Not on file    Attends meetings of clubs or organizations: Not on file    Relationship status: Not on file  . Intimate partner violence    Fear of current or ex partner: Not on file    Emotionally abused: Not on file    Physically abused: Not on file    Forced sexual activity: Not on file  Other Topics Concern  . Not on file  Social History Narrative   Tobacco Use Cigarettes: Former Smoker, Quit in 2008   No Alcohol   No recreational drug use   Diet: Regular/Low Carb   Exercise: None   Occupation: disabled   Education: Research officer, political party, masters   Children: 2   Firearms: No   Therapist, art Use: Always   Former Metallurgist.        Physical Exam Pulmonary:     Effort: No respiratory distress.     Breath sounds: No wheezing or rales.         Future  Appointments  Date Time Provider [place]  01/18/2019  7:25 AM [facility] DEVICE REMOTES [facility]  01/18/2019  2:00 PM [facility] None  02/06/2019  8:55 AM [facility] DEVICE REMOTES  [facility]  03/01/2019 11:30 AM [facility] PA/NP [facility] None  03/08/2019  3:30 PM Marzetta Board, DPM [facility]  06/14/2019 10:00 AM Philemon Kingdom, MD [facility] None     BP 118/70 (BP Location: Right Arm, Patient Position: Sitting, Cuff Size: Normal)   Pulse 76   Temp 97.8 F (36.6 C)   Wt 168 lb (76.2 kg)   SpO2 97%   BMI 30.73 kg/m   Weight yesterday-168 (tues) Last visit weight-168  ATF pt CAO to her norm c/o hives on her arms.  She said that she think it's because of her nerves. Her psychiatrist reduce her meds; Mrs. Novak says that the hives is a result of her not getting her meds like she was before. She doesn't have any other complaints today. She's taken all of her medications for the past two weeks. Pt denies sob, chest pain and dizziness.  She hasn't fallen in several weeks. she hasn't had any issues with sleep walking or falling asleep while cooking since she's been taken off of the Ambien. rx bottles verified and pill box (2) refilled.   She's completely out of midodrine.  Her husband went to the pharmacy to pick them up.  I showed him where to put the pills.    Citalopram increased to 30mg  1.5 pill clonzapam decreased to .5mg  morning and afternoon   Medication ordered: Citalopram filled until wed 2nd box Midodrine completely out Acetazolamide  Nykole Matos, EMT Paramedic [phone] 01/12/2019    ACTION: Home visit completed

## 2019-01-18 ENCOUNTER — Other Ambulatory Visit: Payer: Medicare HMO

## 2019-01-18 ENCOUNTER — Ambulatory Visit (INDEPENDENT_AMBULATORY_CARE_PROVIDER_SITE_OTHER): Payer: Medicare HMO | Admitting: *Deleted

## 2019-01-18 DIAGNOSIS — I428 Other cardiomyopathies: Secondary | ICD-10-CM

## 2019-01-18 LAB — CUP PACEART REMOTE DEVICE CHECK
Battery Remaining Longevity: 17 mo
Battery Voltage: 2.89 V
Brady Statistic AP VP Percent: 0.01 %
Brady Statistic AP VS Percent: 0 %
Brady Statistic AS VP Percent: 99.98 %
Brady Statistic AS VS Percent: 0.01 %
Brady Statistic RA Percent Paced: 0.01 %
Brady Statistic RV Percent Paced: 99.98 %
Date Time Interrogation Session: 20200826052306
HighPow Impedance: 55 Ohm
Implantable Lead Implant Date: 20141210
Implantable Lead Implant Date: 20141210
Implantable Lead Implant Date: 20141210
Implantable Lead Location: 753858
Implantable Lead Location: 753859
Implantable Lead Location: 753860
Implantable Lead Model: 4396
Implantable Lead Model: 5076
Implantable Lead Model: 6935
Implantable Pulse Generator Implant Date: 20141210
Lead Channel Impedance Value: 399 Ohm
Lead Channel Impedance Value: 399 Ohm
Lead Channel Impedance Value: 456 Ohm
Lead Channel Impedance Value: 532 Ohm
Lead Channel Impedance Value: 627 Ohm
Lead Channel Impedance Value: 665 Ohm
Lead Channel Pacing Threshold Amplitude: 0.625 V
Lead Channel Pacing Threshold Amplitude: 0.75 V
Lead Channel Pacing Threshold Amplitude: 1.125 V
Lead Channel Pacing Threshold Pulse Width: 0.4 ms
Lead Channel Pacing Threshold Pulse Width: 0.4 ms
Lead Channel Pacing Threshold Pulse Width: 0.4 ms
Lead Channel Sensing Intrinsic Amplitude: 2.625 mV
Lead Channel Sensing Intrinsic Amplitude: 2.625 mV
Lead Channel Sensing Intrinsic Amplitude: 24.625 mV
Lead Channel Sensing Intrinsic Amplitude: 24.625 mV
Lead Channel Setting Pacing Amplitude: 1.75 V
Lead Channel Setting Pacing Amplitude: 2.25 V
Lead Channel Setting Pacing Amplitude: 2.5 V
Lead Channel Setting Pacing Pulse Width: 0.4 ms
Lead Channel Setting Pacing Pulse Width: 0.4 ms
Lead Channel Setting Sensing Sensitivity: 0.3 mV

## 2019-01-19 ENCOUNTER — Other Ambulatory Visit: Payer: Medicare HMO

## 2019-01-24 ENCOUNTER — Other Ambulatory Visit (HOSPITAL_COMMUNITY): Payer: Self-pay

## 2019-01-24 NOTE — Progress Notes (Signed)
Paramedicine Encounter    Patient ID: Rhonda [name], female    DOB: Dec 14, 1950, 68 y.o.   MRN: [id number]    Patient Care Team: Maurice Small, MD as PCP - General (Family Medicine) [name], LCSW as Social Worker (Licensed Clinical Social Worker)  Patient Active Problem List   Diagnosis Date Noted  . Hypokalemia 11/08/2018  . CKD (chronic kidney disease) stage 3, GFR 30-59 ml/min (HCC) 11/08/2018  . Pain, dental 08/31/2018  . Orthostatic hypotension 07/28/2017  . SVD (spontaneous vaginal delivery)   . Peripheral neuropathy   . On home oxygen therapy   . Migraines   . Left bundle branch block   . Hypothyroidism   . Hypertension   . Hyperlipidemia   . Heart murmur   . GERD (gastroesophageal reflux disease)   . Exertional shortness of breath   . Depression   . Complication of anesthesia   . Back pain   . Arthritis   . Anxiety   . Anemia   . HTN (hypertension) 09/14/2013  . Chronic respiratory failure ([place]) 09/14/2013  . 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 Morbid obesity ([place]) 11/01/2012  . Chronic systolic heart failure ([place]) 10/27/2012  . S/P right TKA 05/10/2012  . Endometrial polyp 01/20/2012  . Breast cancer, stage 1 ([place]) 03/26/2011  . Unspecified vitamin D deficiency 03/26/2011    Current Outpatient Medications:  .  acetaZOLAMIDE (DIAMOX) 125 MG tablet, TAKE 1 TABLET(125 MG) BY MOUTH DAILY (Patient taking differently: Take 125 mg by mouth daily. ), Disp: 30 tablet, Rfl: 5 .  buPROPion (WELLBUTRIN XL) 150 MG 24 hr tablet, Take 450 mg by mouth every morning. , Disp: , Rfl:  .  carvedilol (COREG) 3.125 MG tablet, Take 3.125 mg by mouth 2 (two) times daily with a meal., Disp: , Rfl:  .  citalopram (CELEXA) 20 MG tablet, Take 20 mg by mouth daily., Disp: , Rfl:  .  clonazePAM (KLONOPIN) 0.5 MG tablet, Take 0.5 mg by mouth 3 (three) times daily as needed for anxiety. , Disp: , Rfl:  .  fexofenadine (ALLEGRA) 180 MG  tablet, Take 180 mg by mouth 2 (two) times daily., Disp: , Rfl:  .  gabapentin (NEURONTIN) 300 MG capsule, Take 300 mg by mouth 3 (three) times daily. , Disp: , Rfl:  .  metolazone (ZAROXOLYN) 2.5 MG tablet, Take 1 tablet (2.5 mg total) by mouth daily as needed (edema). Take only as directed by CHF clinic, Disp: , Rfl:  .  midodrine (PROAMATINE) 10 MG tablet, Take 1 tablet (10 mg total) by mouth 3 (three) times daily with meals., Disp: 90 tablet, Rfl: 3 .  potassium chloride SA (K-DUR) 20 MEQ tablet, Take 2 tablets (40 mEq total) by mouth 2 (two) times daily., Disp: 180 tablet, Rfl: 11 .  simvastatin (ZOCOR) 10 MG tablet, Take 10 mg by mouth every evening. , Disp: , Rfl:  .  spironolactone (ALDACTONE) 25 MG tablet, Take 0.5 tablets (12.5 mg total) by mouth every evening., Disp: 45 tablet, Rfl: 1 .  torsemide (DEMADEX) 20 MG tablet, Take 4 tablets (80 mg total) by mouth daily., Disp: 120 tablet, Rfl: 6 .  carboxymethylcellulose (REFRESH PLUS) 0.5 % SOLN, Place 1 drop into both eyes 3 (three) times daily as needed (dry eyes)., Disp: , Rfl:  .  gabapentin (NEURONTIN) 800 MG tablet, Take 800 mg by mouth at bedtime as needed (neuropathy). , Disp: , Rfl:  .  ipratropium (ATROVENT) 0.03 % nasal spray, Place  2 sprays into both nostrils 2 (two) times daily., Disp: , Rfl:  .  levothyroxine (SYNTHROID) 88 MCG tablet, Take 1 tablet (88 mcg total) by mouth daily before breakfast., Disp: 45 tablet, Rfl: 3 .  montelukast (SINGULAIR) 10 MG tablet, Take 10 mg by mouth at bedtime. , Disp: , Rfl: 1 .  zolpidem (AMBIEN) 10 MG tablet, Take 10 mg by mouth at bedtime as needed for sleep. For sleep , Disp: , Rfl:  Allergies  Allergen Reactions  . Ceftin Anaphylaxis    Face and throat swell   . Shellfish Allergy Other (See Comments)    Gout exacerbation  . Geodon [Ziprasidone Hcl] Hives  . Lisinopril Other (See Comments)    angioedema  . Allopurinol Nausea Only and Other (See Comments)    weakness  . Ativan  [Lorazepam] Itching  . Sulfa Antibiotics Itching  . Ultram [Tramadol Hcl] Itching     Social History   Socioeconomic History  . Marital status: Married    Spouse name: Not on file  . Number of children: 2  . Years of education: masters  . Highest education level: Not on file  Occupational History  . Not on file  Social Needs  . Financial resource strain: Not on file  . Food insecurity    Worry: Not on file    Inability: Not on file  . Transportation needs    Medical: Not on file    Non-medical: Not on file  Tobacco Use  . Smoking status: Former Smoker    Packs/day: 0.10    Years: 26.00    Pack years: 2.60    Types: Cigarettes    Quit date: 05/06/2007    Years since quitting: 11.7  . Smokeless tobacco: Never Used  Substance and Sexual Activity  . Alcohol use: No  . Drug use: No  . Sexual activity: Yes  Lifestyle  . Physical activity    Days per week: Not on file    Minutes per session: Not on file  . Stress: Not on file  Relationships  . Social Herbalist on phone: Not on file    Gets together: Not on file    Attends religious service: Not on file    Active member of club or organization: Not on file    Attends meetings of clubs or organizations: Not on file    Relationship status: Not on file  . Intimate partner violence    Fear of current or ex partner: Not on file    Emotionally abused: Not on file    Physically abused: Not on file    Forced sexual activity: Not on file  Other Topics Concern  . Not on file  Social History Narrative   Tobacco Use Cigarettes: Former Smoker, Quit in 2008   No Alcohol   No recreational drug use   Diet: Regular/Low Carb   Exercise: None   Occupation: disabled   Education: Research officer, political party, masters   Children: 2   Firearms: No   Therapist, art Use: Always   Former Metallurgist.        Physical Exam Pulmonary:     Effort: No respiratory distress.     Breath sounds: No wheezing or rales.  Abdominal:     General:  There is no distension.  Musculoskeletal:        General: No swelling.     Right lower leg: No edema.     Left lower leg: No edema.  Skin:  General: Skin is warm and dry.         Future Appointments  Date Time Provider [place]  02/01/2019 11:00 AM [facility] None  02/06/2019  8:55 AM [facility] DEVICE REMOTES [facility]  03/01/2019 11:30 AM [facility] PA/NP [facility] None  03/08/2019  3:30 PM Marzetta Board, DPM [facility]  06/14/2019 10:00 AM Philemon Kingdom, MD [facility] None     BP 100/64 (BP Location: Left Arm, Patient Position: Standing, Cuff Size: Normal)   Pulse 67   Temp 97.9 F (36.6 C)   Resp 15   Wt 171 lb 3.2 oz (77.7 kg)   BMI 31.31 kg/m   Weight yesterday-167.1 Last visit weight-168  ATF pt CAO x4 sitting in the kitchen. She called last week and said that she mixed up her medications.  It's unclear what she did to her medications.  I noticed that she replaced clonzapam .5 tab to a whole tab.  I told her that she' getting all of her anxiety medications. She was mistaken and thought that he husband had taken away her meds.  rx bottles verified and (2) pill boxes filled. One pill box is in a large freezer bag and placed her "current meds box".   Medication ordered: clonzapam Bupropion  Kalya Troeger, EMT Paramedic [phone] 01/24/2019    ACTION: Home visit completed

## 2019-01-26 ENCOUNTER — Encounter: Payer: Self-pay | Admitting: Cardiology

## 2019-01-26 NOTE — Progress Notes (Signed)
Remote ICD transmission.   

## 2019-02-01 ENCOUNTER — Other Ambulatory Visit: Payer: Medicare HMO

## 2019-02-06 ENCOUNTER — Ambulatory Visit (INDEPENDENT_AMBULATORY_CARE_PROVIDER_SITE_OTHER): Payer: Medicare HMO

## 2019-02-06 DIAGNOSIS — Z95810 Presence of automatic (implantable) cardiac defibrillator: Secondary | ICD-10-CM | POA: Diagnosis not present

## 2019-02-06 DIAGNOSIS — I5022 Chronic systolic (congestive) heart failure: Secondary | ICD-10-CM

## 2019-02-06 NOTE — Progress Notes (Signed)
EPIC Encounter for ICM Monitoring  Patient Name: Rhonda [name] is a 68 y.o. female Date: 02/06/2019 Primary Care Physican: Maurice Small, MD Primary Cardiologist:Bensimhon Electrophysiologist: Santina Evans Pacing: 100% 02/06/2019 Weight: 173 lbs(at office visit)   Spoke with patient.  She said she feels okay and denies any changes.  Optivol thoracic impedancecontinues to suggest possible ongoingfluid accumulation since February.   Prescribed:Torsemide 20 mg4tablets (80 mg total)by mouthdaily.Potassium 20 meq 2tablets (40 mEq total)twice a day. Metolazone 2.5mg  1 tablet by mouth if needed for fluid or edema.  Labs: 01/11/2019 Creatinine 1.84, BUN 35, Potassium 4.2, Sodium 140, GFR 28-33 11/10/2018 Creatinine1.21, BUN25, Potassium3.9, Sodium138, [id number] 11/09/2018 Creatinine1.53, BUN22, Potassium2.9, [id number], [id number]  11/08/2018 Creatinine1.75, BUN25, Potassium<2.0, Sodium141, [id number]  10/27/2018 Creatinine1.44, BUN15, Potassium5.2, [id number], [id number] A complete set of results can be found in Results Review.  Recommendations: Reinforced limiting salt intake to < 2000 mg daily and fluid intake to 64 oz daily.  Encouraged to call if experiencing fluid symptoms.  Follow-up plan: ICM clinic phone appointment on 03/20/2019.  Office appt 03/01/2019 with [facility] PA/NP.    Copy of ICM check sent to Dr. Lovena Le and Dr Haroldine Laws.   3 month ICM trend: 02/06/2019    1 Year ICM trend:       [name], RN 02/06/2019 1:09 PM

## 2019-02-07 ENCOUNTER — Other Ambulatory Visit (HOSPITAL_COMMUNITY): Payer: Self-pay

## 2019-02-07 ENCOUNTER — Telehealth (HOSPITAL_COMMUNITY): Payer: Self-pay

## 2019-02-07 MED ORDER — MIDODRINE HCL 10 MG PO TABS: 10.0000 mg | ORAL_TABLET | Freq: Three times a day (TID) | ORAL | 3 refills | Status: DC

## 2019-02-07 NOTE — Telephone Encounter (Signed)
Received call from [facility] noted 11 lb weight gain since 9/1.  Pt reports eating ice cream and drinking a lot of fluid. No reports of chest pain.  Pt does have baseline bilateral lower extremity swelling however now complaining of pain in legs.  D/w Amy NP, advised patient can take a metolazone 2.5mg  today.  LM x2 with detailed instructions on patient phone.  Pt also need discuss on dietary changes. Karena Addison also made aware I was unable to contact patient. She will go to patients house to d/w patient.

## 2019-02-07 NOTE — Progress Notes (Signed)
Paramedicine Encounter    Patient ID: Rhonda [name], female    DOB: 10-01-1950, 68 y.o.   MRN: [id number]    Patient Care Team: Maurice Small, MD as PCP - General (Family Medicine) [name], LCSW as Social Worker (Licensed Clinical Social Worker)  Patient Active Problem List   Diagnosis Date Noted  . Hypokalemia 11/08/2018  . CKD (chronic kidney disease) stage 3, GFR 30-59 ml/min (HCC) 11/08/2018  . Pain, dental 08/31/2018  . Orthostatic hypotension 07/28/2017  . SVD (spontaneous vaginal delivery)   . Peripheral neuropathy   . On home oxygen therapy   . Migraines   . Left bundle branch block   . Hypothyroidism   . Hypertension   . Hyperlipidemia   . Heart murmur   . GERD (gastroesophageal reflux disease)   . Exertional shortness of breath   . Depression   . Complication of anesthesia   . Back pain   . Arthritis   . Anxiety   . Anemia   . HTN (hypertension) 09/14/2013  . Chronic respiratory failure ([place]) 09/14/2013  . 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 Morbid obesity ([place]) 11/01/2012  . Chronic systolic heart failure ([place]) 10/27/2012  . S/P right TKA 05/10/2012  . Endometrial polyp 01/20/2012  . Breast cancer, stage 1 ([place]) 03/26/2011  . Unspecified vitamin D deficiency 03/26/2011    Current Outpatient Medications:  .  acetaZOLAMIDE (DIAMOX) 125 MG tablet, TAKE 1 TABLET(125 MG) BY MOUTH DAILY (Patient taking differently: Take 125 mg by mouth daily. ), Disp: 30 tablet, Rfl: 5 .  buPROPion (WELLBUTRIN XL) 150 MG 24 hr tablet, Take 450 mg by mouth every morning. , Disp: , Rfl:  .  carvedilol (COREG) 3.125 MG tablet, Take 3.125 mg by mouth 2 (two) times daily with a meal., Disp: , Rfl:  .  citalopram (CELEXA) 20 MG tablet, Take 20 mg by mouth daily., Disp: , Rfl:  .  fexofenadine (ALLEGRA) 180 MG tablet, Take 180 mg by mouth 2 (two) times daily., Disp: , Rfl:  .  gabapentin (NEURONTIN) 300 MG capsule, Take 300 mg by mouth  3 (three) times daily. , Disp: , Rfl:  .  gabapentin (NEURONTIN) 800 MG tablet, Take 800 mg by mouth at bedtime as needed (neuropathy). , Disp: , Rfl:  .  levothyroxine (SYNTHROID) 88 MCG tablet, Take 1 tablet (88 mcg total) by mouth daily before breakfast., Disp: 45 tablet, Rfl: 3 .  potassium chloride SA (K-DUR) 20 MEQ tablet, Take 2 tablets (40 mEq total) by mouth 2 (two) times daily., Disp: 180 tablet, Rfl: 11 .  simvastatin (ZOCOR) 10 MG tablet, Take 10 mg by mouth every evening. , Disp: , Rfl:  .  spironolactone (ALDACTONE) 25 MG tablet, Take 0.5 tablets (12.5 mg total) by mouth every evening., Disp: 45 tablet, Rfl: 1 .  torsemide (DEMADEX) 20 MG tablet, Take 4 tablets (80 mg total) by mouth daily., Disp: 120 tablet, Rfl: 6 .  carboxymethylcellulose (REFRESH PLUS) 0.5 % SOLN, Place 1 drop into both eyes 3 (three) times daily as needed (dry eyes)., Disp: , Rfl:  .  clonazePAM (KLONOPIN) 0.5 MG tablet, Take 0.5 mg by mouth 3 (three) times daily as needed for anxiety. , Disp: , Rfl:  .  ipratropium (ATROVENT) 0.03 % nasal spray, Place 2 sprays into both nostrils 2 (two) times daily., Disp: , Rfl:  .  metolazone (ZAROXOLYN) 2.5 MG tablet, Take 1 tablet (2.5 mg total) by mouth daily as needed (edema). Take  only as directed by CHF clinic, Disp: , Rfl:  .  midodrine (PROAMATINE) 10 MG tablet, Take 1 tablet (10 mg total) by mouth 3 (three) times daily with meals., Disp: 90 tablet, Rfl: 3 .  montelukast (SINGULAIR) 10 MG tablet, Take 10 mg by mouth at bedtime. , Disp: , Rfl: 1 .  zolpidem (AMBIEN) 10 MG tablet, Take 10 mg by mouth at bedtime as needed for sleep. For sleep , Disp: , Rfl:  Allergies  Allergen Reactions  . Ceftin Anaphylaxis    Face and throat swell   . Shellfish Allergy Other (See Comments)    Gout exacerbation  . Geodon [Ziprasidone Hcl] Hives  . Lisinopril Other (See Comments)    angioedema  . Allopurinol Nausea Only and Other (See Comments)    weakness  . Ativan [Lorazepam]  Itching  . Sulfa Antibiotics Itching  . Ultram [Tramadol Hcl] Itching     Social History   Socioeconomic History  . Marital status: Married    Spouse name: Not on file  . Number of children: 2  . Years of education: masters  . Highest education level: Not on file  Occupational History  . Not on file  Social Needs  . Financial resource strain: Not on file  . Food insecurity    Worry: Not on file    Inability: Not on file  . Transportation needs    Medical: Not on file    Non-medical: Not on file  Tobacco Use  . Smoking status: Former Smoker    Packs/day: 0.10    Years: 26.00    Pack years: 2.60    Types: Cigarettes    Quit date: 05/06/2007    Years since quitting: 11.7  . Smokeless tobacco: Never Used  Substance and Sexual Activity  . Alcohol use: No  . Drug use: No  . Sexual activity: Yes  Lifestyle  . Physical activity    Days per week: Not on file    Minutes per session: Not on file  . Stress: Not on file  Relationships  . Social Herbalist on phone: Not on file    Gets together: Not on file    Attends religious service: Not on file    Active member of club or organization: Not on file    Attends meetings of clubs or organizations: Not on file    Relationship status: Not on file  . Intimate partner violence    Fear of current or ex partner: Not on file    Emotionally abused: Not on file    Physically abused: Not on file    Forced sexual activity: Not on file  Other Topics Concern  . Not on file  Social History Narrative   Tobacco Use Cigarettes: Former Smoker, Quit in 2008   No Alcohol   No recreational drug use   Diet: Regular/Low Carb   Exercise: None   Occupation: disabled   Education: Research officer, political party, masters   Children: 2   Firearms: No   Therapist, art Use: Always   Former Metallurgist.        Physical Exam Pulmonary:     Effort: Pulmonary effort is normal.     Breath sounds: No wheezing or rales.  Musculoskeletal:     Right lower  leg: Edema present.     Left lower leg: Edema present.  Skin:    General: Skin is warm and dry.         Future Appointments  Date Time Provider [place]  03/01/2019 11:30 AM [facility] PA/NP [facility] None  03/08/2019  3:30 PM Marzetta Board, DPM [facility]  03/20/2019  8:45 AM [facility] DEVICE REMOTES [facility]  04/19/2019  7:30 AM [facility] DEVICE REMOTES [facility]  06/14/2019 10:00 AM Philemon Kingdom, MD [facility] None  07/19/2019  7:30 AM [facility] DEVICE REMOTES [facility]  10/18/2019  7:30 AM [facility] DEVICE REMOTES [facility]  01/17/2020  7:30 AM [facility] DEVICE REMOTES [facility]  04/17/2020  7:30 AM [facility] DEVICE REMOTES [facility]     BP 122/70 (BP Location: Right Arm, Patient Position: Sitting, Cuff Size: Large)   Temp 99.3 F (37.4 C)   Wt 183 lb 3.2 oz (83.1 kg)   SpO2 98%   BMI 33.51 kg/m   Weight yesterday-she didn't weigh Last visit weight-171 (9/1)  ATF pt CAO to her normal standing in the kitchen. She hasn't taken her morning medications this morning.  She denies sob, chest pain and dizziness. She's had diarrhea a couple of times since yesterday; no nausea or vomiting. Pt has a 11 lb weigh gain since 9/1; she said that she hasn't been "eating anything really".  Pt said that she's been eating a lot of ice cream and drinking smoothies.  I advised her to limit the amount of fluids that she consumes.  I spoke to Paton with [facility] about Mrs. Weiland weight gain. She said that Amy advises pt to take a metolazone today.  I called her husband and asked him to give her one pill.  rx bottles verified and pill box (2) refilled.    Medication ordered: clonazepram Midodrine  Delise Simenson, EMT Paramedic [phone] 02/07/2019    ACTION: Home visit completed

## 2019-02-14 DIAGNOSIS — R0982 Postnasal drip: Secondary | ICD-10-CM | POA: Diagnosis not present

## 2019-02-15 ENCOUNTER — Telehealth (HOSPITAL_COMMUNITY): Payer: Self-pay | Admitting: Cardiology

## 2019-02-15 DIAGNOSIS — I5022 Chronic systolic (congestive) heart failure: Secondary | ICD-10-CM

## 2019-02-15 NOTE — Telephone Encounter (Signed)
Can take dose of metolazone. Needs BMET the week.

## 2019-02-15 NOTE — Telephone Encounter (Signed)
Patient called to report increased BLE. Reports her legs are swollen up into mid calf, painful, and shiny. Not quite as bad as they were when legs were wrapped with compression  Reports she has not missed any doses of medications,  Mild SOB with exertion, weight 187 yesterday, and reports compliance with fluid intake <72 oz's daily   Will forward to provider, ok for metolazone?

## 2019-02-15 NOTE — Addendum Note (Signed)
Addended by: Kerry Dory on: 02/15/2019 04:13 PM   Modules accepted: Orders

## 2019-02-15 NOTE — Telephone Encounter (Signed)
Patient aware and voiced understanding Repeat labs 9/28

## 2019-02-16 DIAGNOSIS — F3289 Other specified depressive episodes: Secondary | ICD-10-CM | POA: Diagnosis not present

## 2019-02-20 ENCOUNTER — Other Ambulatory Visit (HOSPITAL_COMMUNITY): Payer: Medicare HMO

## 2019-02-22 ENCOUNTER — Other Ambulatory Visit (INDEPENDENT_AMBULATORY_CARE_PROVIDER_SITE_OTHER): Payer: Medicare HMO

## 2019-02-22 ENCOUNTER — Telehealth: Payer: Self-pay | Admitting: Neurology

## 2019-02-22 ENCOUNTER — Other Ambulatory Visit: Payer: Self-pay

## 2019-02-22 ENCOUNTER — Other Ambulatory Visit (HOSPITAL_COMMUNITY): Payer: Medicare HMO

## 2019-02-22 ENCOUNTER — Other Ambulatory Visit (HOSPITAL_COMMUNITY): Payer: Self-pay

## 2019-02-22 DIAGNOSIS — E039 Hypothyroidism, unspecified: Secondary | ICD-10-CM | POA: Diagnosis not present

## 2019-02-22 DIAGNOSIS — Z23 Encounter for immunization: Secondary | ICD-10-CM | POA: Diagnosis not present

## 2019-02-22 LAB — TSH: TSH: 0.21 u[IU]/mL — ABNORMAL LOW (ref 0.35–4.50)

## 2019-02-22 LAB — T4, FREE: Free T4: 0.51 ng/dL — ABNORMAL LOW (ref 0.60–1.60)

## 2019-02-22 NOTE — Telephone Encounter (Signed)
Patient came into the office today seeming very concerned. She said that she was referred to Dr. Si Raider before she had left the office. Those appointments were cancelled and she was referred to [place]. She said she could not go to [place]. She said she has been calling the office but I do not see anything in her chart. She said he has talked to after hours a few times. She said she is concerned about her memory and her repeating herself and leaving things on the hot stove and forgetting about it. She said that she has been in the hospital some since she saw Dr. [name] last. She said that her Potassium was low. She feels she is not getting any better. She requested an in office visit to follow up with Dr. [name]. She is also wanting to see if she can see Dr. Melvyn Novas here in the office. Please Call. Thanks

## 2019-02-22 NOTE — Progress Notes (Signed)
Paramedicine Encounter    Patient ID: Rhonda [name], female    DOB: May 03, 1951, 68 y.o.   MRN: [id number]    Patient Care Team: Maurice Small, MD as PCP - General (Family Medicine) [name], LCSW as Social Worker (Licensed Clinical Social Worker)  Patient Active Problem List   Diagnosis Date Noted  . Hypokalemia 11/08/2018  . CKD (chronic kidney disease) stage 3, GFR 30-59 ml/min 11/08/2018  . Pain, dental 08/31/2018  . Orthostatic hypotension 07/28/2017  . SVD (spontaneous vaginal delivery)   . Peripheral neuropathy   . On home oxygen therapy   . Migraines   . Left bundle branch block   . Hypothyroidism   . Hypertension   . Hyperlipidemia   . Heart murmur   . GERD (gastroesophageal reflux disease)   . Exertional shortness of breath   . Depression   . Complication of anesthesia   . Back pain   . Arthritis   . Anxiety   . Anemia   . HTN (hypertension) 09/14/2013  . Chronic respiratory failure ([place]) 09/14/2013  . 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 Morbid obesity ([place]) 11/01/2012  . Chronic systolic heart failure ([place]) 10/27/2012  . S/P right TKA 05/10/2012  . Endometrial polyp 01/20/2012  . Breast cancer, stage 1 ([place]) 03/26/2011  . Unspecified vitamin D deficiency 03/26/2011    Current Outpatient Medications:  .  acetaZOLAMIDE (DIAMOX) 125 MG tablet, TAKE 1 TABLET(125 MG) BY MOUTH DAILY (Patient taking differently: Take 125 mg by mouth daily. ), Disp: 30 tablet, Rfl: 5 .  buPROPion (WELLBUTRIN XL) 150 MG 24 hr tablet, Take 450 mg by mouth every morning. , Disp: , Rfl:  .  carvedilol (COREG) 3.125 MG tablet, Take 3.125 mg by mouth 2 (two) times daily with a meal., Disp: , Rfl:  .  citalopram (CELEXA) 20 MG tablet, Take 20 mg by mouth daily., Disp: , Rfl:  .  clonazePAM (KLONOPIN) 0.5 MG tablet, Take 0.5 mg by mouth 3 (three) times daily as needed for anxiety. , Disp: , Rfl:  .  gabapentin (NEURONTIN) 300 MG capsule,  Take 300 mg by mouth 3 (three) times daily. , Disp: , Rfl:  .  gabapentin (NEURONTIN) 800 MG tablet, Take 800 mg by mouth at bedtime as needed (neuropathy). , Disp: , Rfl:  .  levothyroxine (SYNTHROID) 88 MCG tablet, Take 1 tablet (88 mcg total) by mouth daily before breakfast., Disp: 45 tablet, Rfl: 3 .  metolazone (ZAROXOLYN) 2.5 MG tablet, Take 1 tablet (2.5 mg total) by mouth daily as needed (edema). Take only as directed by CHF clinic, Disp: , Rfl:  .  midodrine (PROAMATINE) 10 MG tablet, Take 1 tablet (10 mg total) by mouth 3 (three) times daily with meals., Disp: 90 tablet, Rfl: 3 .  potassium chloride SA (K-DUR) 20 MEQ tablet, Take 2 tablets (40 mEq total) by mouth 2 (two) times daily., Disp: 180 tablet, Rfl: 11 .  simvastatin (ZOCOR) 10 MG tablet, Take 10 mg by mouth every evening. , Disp: , Rfl:  .  spironolactone (ALDACTONE) 25 MG tablet, Take 0.5 tablets (12.5 mg total) by mouth every evening., Disp: 45 tablet, Rfl: 1 .  torsemide (DEMADEX) 20 MG tablet, Take 4 tablets (80 mg total) by mouth daily., Disp: 120 tablet, Rfl: 6 .  carboxymethylcellulose (REFRESH PLUS) 0.5 % SOLN, Place 1 drop into both eyes 3 (three) times daily as needed (dry eyes)., Disp: , Rfl:  .  fexofenadine (ALLEGRA) 180 MG tablet,  Take 180 mg by mouth 2 (two) times daily., Disp: , Rfl:  .  ipratropium (ATROVENT) 0.03 % nasal spray, Place 2 sprays into both nostrils 2 (two) times daily., Disp: , Rfl:  .  montelukast (SINGULAIR) 10 MG tablet, Take 10 mg by mouth at bedtime. , Disp: , Rfl: 1 .  zolpidem (AMBIEN) 10 MG tablet, Take 10 mg by mouth at bedtime as needed for sleep. For sleep , Disp: , Rfl:  Allergies  Allergen Reactions  . Ceftin Anaphylaxis    Face and throat swell   . Shellfish Allergy Other (See Comments)    Gout exacerbation  . Geodon [Ziprasidone Hcl] Hives  . Lisinopril Other (See Comments)    angioedema  . Allopurinol Nausea Only and Other (See Comments)    weakness  . Ativan [Lorazepam]  Itching  . Sulfa Antibiotics Itching  . Ultram [Tramadol Hcl] Itching     Social History   Socioeconomic History  . Marital status: Married    Spouse name: Not on file  . Number of children: 2  . Years of education: masters  . Highest education level: Not on file  Occupational History  . Not on file  Social Needs  . Financial resource strain: Not on file  . Food insecurity    Worry: Not on file    Inability: Not on file  . Transportation needs    Medical: Not on file    Non-medical: Not on file  Tobacco Use  . Smoking status: Former Smoker    Packs/day: 0.10    Years: 26.00    Pack years: 2.60    Types: Cigarettes    Quit date: 05/06/2007    Years since quitting: 11.8  . Smokeless tobacco: Never Used  Substance and Sexual Activity  . Alcohol use: No  . Drug use: No  . Sexual activity: Yes  Lifestyle  . Physical activity    Days per week: Not on file    Minutes per session: Not on file  . Stress: Not on file  Relationships  . Social Herbalist on phone: Not on file    Gets together: Not on file    Attends religious service: Not on file    Active member of club or organization: Not on file    Attends meetings of clubs or organizations: Not on file    Relationship status: Not on file  . Intimate partner violence    Fear of current or ex partner: Not on file    Emotionally abused: Not on file    Physically abused: Not on file    Forced sexual activity: Not on file  Other Topics Concern  . Not on file  Social History Narrative   Tobacco Use Cigarettes: Former Smoker, Quit in 2008   No Alcohol   No recreational drug use   Diet: Regular/Low Carb   Exercise: None   Occupation: disabled   Education: Research officer, political party, masters   Children: 2   Firearms: No   Therapist, art Use: Always   Former Metallurgist.        Physical Exam Pulmonary:     Effort: No respiratory distress.     Breath sounds: No wheezing or rales.  Musculoskeletal:        General: No  swelling.     Right lower leg: No edema.     Left lower leg: No edema.  Skin:    General: Skin is warm and dry.  Future Appointments  Date Time Provider [place]  03/01/2019 11:30 AM [facility] PA/NP [facility] None  03/08/2019  3:30 PM Marzetta Board, DPM [facility]  03/13/2019  9:30 AM Narda Amber K, DO [facility] None  03/20/2019  8:45 AM [facility] DEVICE REMOTES [facility]  04/19/2019  7:30 AM [facility] DEVICE REMOTES [facility]  06/14/2019 10:00 AM Philemon Kingdom, MD [facility] None  07/19/2019  7:30 AM [facility] DEVICE REMOTES [facility]  10/18/2019  7:30 AM [facility] DEVICE [facility]OTES [facility]  01/17/2020  7:30 AM [facility] DEVICE REMOTES [facility]  04/17/2020  7:30 AM [facility] DEV[facility]ES [facility]     BP 108/68 (BP Location: Right Arm, Patient Position: Sitting, Cuff Size: Large)   Pulse 71   Temp 98.8 F (37.1 C)   Wt 173 lb 12.8 oz (78.8 kg)   SpO2 97%   BMI 31.79 kg/m   Weight yesterday-didn't write her weight down Last visit weight-183  ATF pt CAO to her normal watching tv.  She hasn't taken her medications this morning including Levothyroxin  (pill box empty).  She denies sob, chest pain and dizziness.  She hasn't fallen recently. She took her medications for the past two weeks.  She stated that she's been anxious since yesterday because her husband has an eye injury and she's worried.  Her daughter and family can still come and help her if needed. rx bottles verified and pill box (2) refilled.       Medication ordered: Potassium levothy Bupropion Acetazolamide  Anne-Marie Genson, EMT Paramedic [phone] 02/23/2019    ACTION: Home visit completed

## 2019-02-23 NOTE — Telephone Encounter (Signed)
Please schedule cognitive testing with Dr. Melvyn Novas.  She will need follow-up with me in the office after this evaluation.

## 2019-02-23 NOTE — Telephone Encounter (Signed)
Forwarding to Chelsea/Dana, as instructed, for scheduling with Dr. Melvyn Novas.

## 2019-02-23 NOTE — Telephone Encounter (Signed)
Carla please schedule thanks

## 2019-02-23 NOTE — Telephone Encounter (Signed)
Please advise 

## 2019-02-23 NOTE — Telephone Encounter (Signed)
Patient will call back to sch appointment with Melvyn Novas

## 2019-02-24 ENCOUNTER — Telehealth: Payer: Self-pay | Admitting: Neurology

## 2019-02-24 NOTE — Telephone Encounter (Signed)
Patient needs to talk to someone about some over the counter medication. She would like to know if she can take the medication.she was not sure of the names. She came into the office and asked about this she has them written down at home

## 2019-02-28 NOTE — Telephone Encounter (Signed)
Called and spoke with patient. Patient is now aware Dr. [name] does not recommend the supplement Prevagen. I additionally reassured the patient that she is on the right track with Dr. [name] to get the care she needs. Future appointments confirmed with patient as well. Patient expressed appreciation.

## 2019-02-28 NOTE — Telephone Encounter (Signed)
Patient called back and said she is interested in Dr. Serita Grit opinion about using the over-the-counter medication, Prevagen, to help with her memory. She said she thinks her memory is getting worse.

## 2019-02-28 NOTE — Telephone Encounter (Signed)
I do not recommend this supplement.  It's best to get neuropsychological results first to help guide therapy.  This is already scheduled in the next 2 weeks.

## 2019-03-01 ENCOUNTER — Telehealth: Payer: Self-pay

## 2019-03-01 ENCOUNTER — Encounter (HOSPITAL_COMMUNITY): Payer: Medicare HMO

## 2019-03-01 ENCOUNTER — Telehealth (HOSPITAL_COMMUNITY): Payer: Self-pay

## 2019-03-01 DIAGNOSIS — E039 Hypothyroidism, unspecified: Secondary | ICD-10-CM

## 2019-03-01 DIAGNOSIS — E213 Hyperparathyroidism, unspecified: Secondary | ICD-10-CM

## 2019-03-01 MED ORDER — LEVOTHYROXINE SODIUM 75 MCG PO TABS: 75.0000 ug | ORAL_TABLET | Freq: Every day | ORAL | 3 refills | Status: DC

## 2019-03-01 NOTE — Telephone Encounter (Signed)
Notified patient of message from Dr. Cruzita Lederer, patient expressed understanding and agreement. No further questions.  New RX sent and med list updated, labs ordered.

## 2019-03-01 NOTE — Telephone Encounter (Signed)
-----   Message from Philemon Kingdom, MD sent at 02/22/2019  4:29 PM EDT ----- Lenna Sciara, can you please call pt: Her thyroid tests are still abnormal.  We will need to decrease the dose of levothyroxine further.  If she has enough 88 mcg of levothyroxine at home, she can take this only 6 out of 7 days (for an average daily levothyroxine dose of 75 mcg daily).  If she does not have enough, can you please send a prescription for the 75 mcg and also change this on her medication list?.  We will then need labs in 1.5 months.  Can you please order a TSH and free T4?Marland Kitchen

## 2019-03-01 NOTE — Telephone Encounter (Signed)
I called Rhonda [name] to confirm that he's aware of today's visit at [facility]. There was no answer so I sent a text instead.

## 2019-03-08 ENCOUNTER — Ambulatory Visit: Payer: Medicare HMO | Admitting: Podiatry

## 2019-03-08 ENCOUNTER — Other Ambulatory Visit (HOSPITAL_COMMUNITY): Payer: Self-pay

## 2019-03-08 ENCOUNTER — Other Ambulatory Visit (HOSPITAL_COMMUNITY): Payer: Self-pay | Admitting: *Deleted

## 2019-03-08 MED ORDER — POTASSIUM CHLORIDE CRYS ER 20 MEQ PO TBCR: 40.0000 meq | EXTENDED_RELEASE_TABLET | Freq: Two times a day (BID) | ORAL | 11 refills | Status: DC

## 2019-03-08 MED ORDER — TORSEMIDE 20 MG PO TABS: 80.0000 mg | ORAL_TABLET | Freq: Two times a day (BID) | ORAL | 6 refills | Status: DC

## 2019-03-08 NOTE — Progress Notes (Signed)
Paramedicine Encounter    Patient ID: Rhonda [name], female    DOB: [birth year], 68 y.o.   MRN: [id number]    Patient Care Team: Maurice Small, MD as PCP - General (Family Medicine) [name], LCSW as Social Worker (Licensed Clinical Social Worker)  Patient Active Problem List   Diagnosis Date Noted  . Hypokalemia 11/08/2018  . CKD (chronic kidney disease) stage 3, GFR 30-59 ml/min 11/08/2018  . Pain, dental 08/31/2018  . Orthostatic hypotension 07/28/2017  . SVD (spontaneous vaginal delivery)   . Peripheral neuropathy   . On home oxygen therapy   . Migraines   . Left bundle branch block   . Hypothyroidism   . Hypertension   . Hyperlipidemia   . Heart murmur   . GERD (gastroesophageal reflux disease)   . Exertional shortness of breath   . Depression   . Complication of anesthesia   . Back pain   . Arthritis   . Anxiety   . Anemia   . HTN (hypertension) 09/14/2013  . Chronic respiratory failure ([place]) 09/14/2013  . 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 Morbid obesity ([name]) 11/01/2012  . Chronic systolic heart failure ([place]) 10/27/2012  . S/P right TKA 05/10/2012  . Endometrial polyp 01/20/2012  . Breast cancer, stage 1 ([place]) 03/26/2011  . Unspecified vitamin D deficiency 03/26/2011    Current Outpatient Medications:  .  acetaZOLAMIDE (DIAMOX) 125 MG tablet, TAKE 1 TABLET(125 MG) BY MOUTH DAILY (Patient taking differently: Take 125 mg by mouth daily. ), Disp: 30 tablet, Rfl: 5 .  clonazePAM (KLONOPIN) 0.5 MG tablet, Take 0.5 mg by mouth 3 (three) times daily as needed for anxiety. , Disp: , Rfl:  .  gabapentin (NEURONTIN) 300 MG capsule, Take 300 mg by mouth 3 (three) times daily. , Disp: , Rfl:  .  levothyroxine (SYNTHROID) 75 MCG tablet, Take 1 tablet (75 mcg total) by mouth daily., Disp: 90 tablet, Rfl: 3 .  midodrine (PROAMATINE) 10 MG tablet, Take 1 tablet (10 mg total) by mouth 3 (three) times daily with meals., Disp: 90  tablet, Rfl: 3 .  simvastatin (ZOCOR) 10 MG tablet, Take 10 mg by mouth every evening. , Disp: , Rfl:  .  spironolactone (ALDACTONE) 25 MG tablet, Take 0.5 tablets (12.5 mg total) by mouth every evening., Disp: 45 tablet, Rfl: 1 .  buPROPion (WELLBUTRIN XL) 150 MG 24 hr tablet, Take 450 mg by mouth every morning. , Disp: , Rfl:  .  carboxymethylcellulose (REFRESH PLUS) 0.5 % SOLN, Place 1 drop into both eyes 3 (three) times daily as needed (dry eyes)., Disp: , Rfl:  .  carvedilol (COREG) 3.125 MG tablet, Take 3.125 mg by mouth 2 (two) times daily with a meal., Disp: , Rfl:  .  citalopram (CELEXA) 20 MG tablet, Take 20 mg by mouth daily., Disp: , Rfl:  .  fexofenadine (ALLEGRA) 180 MG tablet, Take 180 mg by mouth 2 (two) times daily., Disp: , Rfl:  .  gabapentin (NEURONTIN) 800 MG tablet, Take 800 mg by mouth at bedtime as needed (neuropathy). , Disp: , Rfl:  .  ipratropium (ATROVENT) 0.03 % nasal spray, Place 2 sprays into both nostrils 2 (two) times daily., Disp: , Rfl:  .  metolazone (ZAROXOLYN) 2.5 MG tablet, Take 1 tablet (2.5 mg total) by mouth daily as needed (edema). Take only as directed by CHF clinic, Disp: , Rfl:  .  montelukast (SINGULAIR) 10 MG tablet, Take 10 mg by mouth at bedtime. ,  Disp: , Rfl: 1 .  potassium chloride SA (KLOR-CON) 20 MEQ tablet, Take 2 tablets (40 mEq total) by mouth 2 (two) times daily., Disp: 180 tablet, Rfl: 11 .  torsemide (DEMADEX) 20 MG tablet, Take 4 tablets (80 mg total) by mouth 2 (two) times daily., Disp: 240 tablet, Rfl: 6 .  zolpidem (AMBIEN) 10 MG tablet, Take 10 mg by mouth at bedtime as needed for sleep. For sleep , Disp: , Rfl:  Allergies  Allergen Reactions  . Ceftin Anaphylaxis    Face and throat swell   . Shellfish Allergy Other (See Comments)    Gout exacerbation  . Geodon [Ziprasidone Hcl] Hives  . Lisinopril Other (See Comments)    angioedema  . Allopurinol Nausea Only and Other (See Comments)    weakness  . Ativan [Lorazepam] Itching   . Sulfa Antibiotics Itching  . Ultram [Tramadol Hcl] Itching     Social History   Socioeconomic History  . Marital status: Married    Spouse name: Not on file  . Number of children: 2  . Years of education: masters  . Highest education level: Not on file  Occupational History  . Not on file  Social Needs  . Financial resource strain: Not on file  . Food insecurity    Worry: Not on file    Inability: Not on file  . Transportation needs    Medical: Not on file    Non-medical: Not on file  Tobacco Use  . Smoking status: Former Smoker    Packs/day: 0.10    Years: 26.00    Pack years: 2.60    Types: Cigarettes    Quit date: 05/06/2007    Years since quitting: 11.8  . Smokeless tobacco: Never Used  Substance and Sexual Activity  . Alcohol use: No  . Drug use: No  . Sexual activity: Yes  Lifestyle  . Physical activity    Days per week: Not on file    Minutes per session: Not on file  . Stress: Not on file  Relationships  . Social Herbalist on phone: Not on file    Gets together: Not on file    Attends religious service: Not on file    Active member of club or organization: Not on file    Attends meetings of clubs or organizations: Not on file    Relationship status: Not on file  . Intimate partner violence    Fear of current or ex partner: Not on file    Emotionally abused: Not on file    Physically abused: Not on file    Forced sexual activity: Not on file  Other Topics Concern  . Not on file  Social History Narrative   Tobacco Use Cigarettes: Former Smoker, Quit in 2008   No Alcohol   No recreational drug use   Diet: Regular/Low Carb   Exercise: None   Occupation: disabled   Education: Research officer, political party, masters   Children: 2   Firearms: No   Therapist, art Use: Always   Former Metallurgist.        Physical Exam Pulmonary:     Effort: Pulmonary effort is normal. No respiratory distress.     Breath sounds: No wheezing.  Abdominal:     General:  There is no distension.  Skin:    General: Skin is warm and dry.         Future Appointments  Date Time Provider [place]  03/13/2019  9:30  AM Narda Amber K, DO [facility] None  03/15/2019  8:30 AM Hazle Coca, PhD [facility] None  03/15/2019  9:30 AM [facility]- NEUROPSYCH TECH [facility] None  03/20/2019  8:45 AM [facility] DEVICE REMOTES [facility]  03/22/2019  2:30 PM Hazle Coca, PhD [facility] None  04/07/2019 10:50 AM Narda Amber K, DO [facility] None  04/19/2019  7:30 AM [facility] DEVICE REMOTES [facility]  06/14/2019 10:00 AM Philemon Kingdom, MD [facility] None  07/19/2019  7:30 AM [facility] DEVICE REMOTES [facility]  10/18/2019  7:30 AM [facility] DEVICE REMOTES [facility]  01/17/2020  7:30 AM [facility] DEVICE REMOTES [facility]  04/17/2020  7:30 AM [facility] DEVICE REMOTES [facility]     BP 108/70 (BP Location: Right Arm, Patient Position: Sitting, Cuff Size: Normal)   Pulse 72   Temp 98 F (36.7 C)   Wt 170 lb 6.4 oz (77.3 kg)   SpO2 98%   BMI 31.17 kg/m   Weight yesterday-171 Last visit weight-173 (9/30)  ATF pt CAO to her norm watching tv with no complaints.  Pt denies sob, chest pain and dizziness.  She hasn't taken her medications this morning; she missed a couple of evening medications last week.  Mrs. Stellman said that she had an panic attack last week, she doesn't have a appointment for her pshychiarist coming up. I advised her to schedule an appointment with him if this continues.  rx bottles verified and pill box refilled (2).    Medication ordered: Torsemide filled until Sunday second box Potassium buproion sat 1st box Gabapentin none in 2nd box clonzapam 2nd box wed   [place], EMT Paramedic [phone] 03/08/2019    ACTION: Home visit completed

## 2019-03-11 ENCOUNTER — Ambulatory Visit
Admission: EM | Admit: 2019-03-11 | Discharge: 2019-03-11 | Disposition: A | Discharge status: Home / Self Care | Payer: Medicare HMO | Source: Home / Self Care

## 2019-03-11 ENCOUNTER — Other Ambulatory Visit: Payer: Self-pay

## 2019-03-11 ENCOUNTER — Emergency Department (HOSPITAL_COMMUNITY)
Admission: EM | Admit: 2019-03-11 | Discharge: 2019-03-11 | Disposition: A | Discharge status: Home / Self Care | Payer: Medicare HMO | Attending: Emergency Medicine | Admitting: Emergency Medicine

## 2019-03-11 ENCOUNTER — Emergency Department (HOSPITAL_COMMUNITY): Payer: Medicare HMO

## 2019-03-11 DIAGNOSIS — Y939 Activity, unspecified: Secondary | ICD-10-CM | POA: Insufficient documentation

## 2019-03-11 DIAGNOSIS — I5022 Chronic systolic (congestive) heart failure: Secondary | ICD-10-CM | POA: Diagnosis not present

## 2019-03-11 DIAGNOSIS — R296 Repeated falls: Secondary | ICD-10-CM | POA: Diagnosis not present

## 2019-03-11 DIAGNOSIS — S0990XA Unspecified injury of head, initial encounter: Secondary | ICD-10-CM

## 2019-03-11 DIAGNOSIS — S0083XA Contusion of other part of head, initial encounter: Secondary | ICD-10-CM | POA: Diagnosis not present

## 2019-03-11 DIAGNOSIS — Z96651 Presence of right artificial knee joint: Secondary | ICD-10-CM | POA: Diagnosis not present

## 2019-03-11 DIAGNOSIS — Z87891 Personal history of nicotine dependence: Secondary | ICD-10-CM | POA: Diagnosis not present

## 2019-03-11 DIAGNOSIS — Z95810 Presence of automatic (implantable) cardiac defibrillator: Secondary | ICD-10-CM | POA: Diagnosis not present

## 2019-03-11 DIAGNOSIS — W010XXA Fall on same level from slipping, tripping and stumbling without subsequent striking against object, initial encounter: Secondary | ICD-10-CM | POA: Insufficient documentation

## 2019-03-11 DIAGNOSIS — E039 Hypothyroidism, unspecified: Secondary | ICD-10-CM | POA: Diagnosis not present

## 2019-03-11 DIAGNOSIS — I130 Hypertensive heart and chronic kidney disease with heart failure and stage 1 through stage 4 chronic kidney disease, or unspecified chronic kidney disease: Secondary | ICD-10-CM | POA: Diagnosis not present

## 2019-03-11 DIAGNOSIS — Y92009 Unspecified place in unspecified non-institutional (private) residence as the place of occurrence of the external cause: Secondary | ICD-10-CM | POA: Diagnosis not present

## 2019-03-11 DIAGNOSIS — N183 Chronic kidney disease, stage 3 unspecified: Secondary | ICD-10-CM | POA: Diagnosis not present

## 2019-03-11 DIAGNOSIS — Y999 Unspecified external cause status: Secondary | ICD-10-CM | POA: Insufficient documentation

## 2019-03-11 DIAGNOSIS — Z79899 Other long term (current) drug therapy: Secondary | ICD-10-CM | POA: Insufficient documentation

## 2019-03-11 DIAGNOSIS — J45909 Unspecified asthma, uncomplicated: Secondary | ICD-10-CM | POA: Insufficient documentation

## 2019-03-11 DIAGNOSIS — W19XXXA Unspecified fall, initial encounter: Secondary | ICD-10-CM

## 2019-03-11 NOTE — ED Triage Notes (Signed)
Pt presents to [facility] for assessmemt after having two trip and falls yesterday, one with head injury against the toilet from standing.  Denies LOC, but large hematoma and avulsion noted above right eye.  Will have provider discuss ED assessment.

## 2019-03-11 NOTE — ED Provider Notes (Signed)
[facility] EMERGENCY DEPARTMENT Provider Note   CSN: [id number] Arrival date & time: 03/11/19  1358     History   Chief Complaint Chief Complaint  Patient presents with  . Fall    HPI Rhonda [name] is a 68 y.o. female.     Pt presents to the ED today with a fall.  Pt said they are redoing her house, and the flooring is uneven.  She tripped and fell last night and hit her eyebrow.  She did not have a loc.  She denies visual problems.  She does have a h/a.  No blood thinners.     Past Medical History:  Diagnosis Date  . Anemia   . Anxiety   . Arthritis    RIGHT KNEE ARTHRITIS AND PAIN-PT TOLD BONE ON BONE  . Asthma   . Back pain    DISK PROBLEM  . Breast cancer, stage 1 ([place]) 03/26/2011   left-FINISHED CHEMO  AND RADIATION  . Cardiomyopathy    [name]  . Chronic systolic heart failure (H[facility])    a) NICM b) ECHO (03/2013) EF 20-25% c) ECHO (09/2013) EF 45-50%, grade I DD  . Complication of anesthesia    PT STATES HER B/P LOW AFTER ONE OF HER SURGERIES--SHE ATTRIBUTES TO LYING FLAT  . Depression   . Diabetes mellitus    "diet controlled" (05/04/2103)  . Exertional shortness of breath   . GERD (gastroesophageal reflux disease)   . Gout   . Heart murmur   . Hyperlipidemia   . Hypertension   . Hypothyroidism   . Left bundle branch block    s/p CRT-D (04/2013)  . Migraines   . On home oxygen therapy    "2L suppose to be q night" (05/03/2013)  . Peripheral neuropathy    feet  . SVD (spontaneous vaginal delivery)    x 2  . Unspecified vitamin D deficiency 03/26/2011   does not take meds    Patient Active Problem List   Diagnosis Date Noted  . Hypokalemia 11/08/2018  . CKD (chronic kidney disease) stage 3, GFR 30-59 ml/min 11/08/2018  . Pain, dental 08/31/2018  . Orthostatic hypotension 07/28/2017  . SVD (spontaneous vaginal delivery)   . Peripheral neuropathy   . On home oxygen therapy   . Migraines   .  Left bundle branch block   . Hypothyroidism   . Hypertension   . Hyperlipidemia   . Heart murmur   . GERD (gastroesophageal reflux disease)   . Exertional shortness of breath   . Depression   . Complication of anesthesia   . Back pain   . Arthritis   . Anxiety   . Anemia   . HTN (hypertension) 09/14/2013  . Chronic respiratory failure ([place]) 09/14/2013  . 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 Morbid obesity ([place]) 11/01/2012  . Chronic systolic heart failure ([place]) 10/27/2012  . S/P right TKA 05/10/2012  . Endometrial polyp 01/20/2012  . Breast cancer, stage 1 ([place]) 03/26/2011  . Unspecified vitamin D deficiency 03/26/2011    Past Surgical History:  Procedure Laterality Date  . BI-VENTRICULAR IMPLANTABLE CARDIOVERTER DEFIBRILLATOR N/A 05/03/2013   Procedure: BI-VENTRICULAR IMPLANTABLE CARDIOVERTER DEFIBRILLATOR  (CRT-D);  Surgeon: Evans Lance, MD;  Location: [facility] CATH LAB;  Service: Cardiovascular;  Laterality: N/A;  . BI-VENTRICULAR IMPLANTABLE CARDIOVERTER DEFIBRILLATOR  (CRT-D)  05/03/2013   MDT CRTD implanted by Dr Lovena Le for non ischemic cardiomyopathy  . BIOPSY  05/31/2018  Procedure: BIOPSY;  Surgeon: Ronnette Juniper, MD;  Location: [facility];  Service: Gastroenterology;;  . BREAST LUMPECTOMY Left 07/28/2010  . CARDIAC CATHETERIZATION  2010   NORMAL CORONARY ARTERIES  . 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--11 SURGERIES     PT HAS REMOVABLE SPEECH BULB-TAKES IT OUT BEFORE HER SURGERY  . COLONOSCOPY    . COLONOSCOPY N/A 05/31/2018   Procedure: COLONOSCOPY;  Surgeon: Ronnette Juniper, MD;  Location: [facility] ENDOSCOPY;  Service: Gastroenterology;  Laterality: N/A;  . HYSTEROSCOPY W/D&C  01/20/2012   Procedure: DILATATION AND CURETTAGE /HYSTEROSCOPY;  Surgeon: Margarette Asal, MD;  Location: [facility];  Service: Gynecology;  Laterality: N/A;  with trueclear  . PORT-A-CATH REMOVAL  09/23/2011   Procedure: REMOVAL PORT-A-CATH;  Surgeon: Rolm Bookbinder,  MD;  Location: [facility] ORS;  Service: General;  Laterality: N/A;  Port Removal  . PORTACATH PLACEMENT  2012  . TONSILLECTOMY  1960's  . TOTAL KNEE ARTHROPLASTY  05/10/2012   Procedure: TOTAL KNEE ARTHROPLASTY;  Surgeon: Mauri Pole, MD;  Location: [facility] ORS;  Service: Orthopedics;  Laterality: Right;  . WISDOM TOOTH EXTRACTION       OB History   No obstetric history on file.      Home Medications    Prior to Admission medications   Medication Sig Start Date End Date Taking? Authorizing Provider  acetaZOLAMIDE (DIAMOX) 125 MG tablet TAKE 1 TABLET(125 MG) BY MOUTH DAILY Patient taking differently: Take 125 mg by mouth daily.  10/06/18   Patel, Donika K, DO  buPROPion (WELLBUTRIN XL) 150 MG 24 hr tablet Take 450 mg by mouth every morning.     [provider]  carboxymethylcellulose (REFRESH PLUS) 0.5 % SOLN Place 1 drop into both eyes 3 (three) times daily as needed (dry eyes).    [provider]  carvedilol (COREG) 3.125 MG tablet Take 3.125 mg by mouth 2 (two) times daily with a meal.    [provider]  citalopram (CELEXA) 20 MG tablet Take 20 mg by mouth daily.    [provider]  clonazePAM (KLONOPIN) 0.5 MG tablet Take 0.5 mg by mouth 3 (three) times daily as needed for anxiety.     [provider]  fexofenadine (ALLEGRA) 180 MG tablet Take 180 mg by mouth 2 (two) times daily.    [provider]  gabapentin (NEURONTIN) 300 MG capsule Take 300 mg by mouth 3 (three) times daily.     [provider]  gabapentin (NEURONTIN) 800 MG tablet Take 800 mg by mouth at bedtime as needed (neuropathy).     [provider]  ipratropium (ATROVENT) 0.03 % nasal spray Place 2 sprays into both nostrils 2 (two) times daily.    [provider]  levothyroxine (SYNTHROID) 75 MCG tablet Take 1 tablet (75 mcg total) by mouth daily. 03/01/19   Philemon Kingdom, MD  metolazone (ZAROXOLYN) 2.5 MG tablet Take 1 tablet (2.5 mg total) by  mouth daily as needed (edema). Take only as directed by CHF clinic 11/10/18   Black, Lezlie Octave, NP  midodrine (PROAMATINE) 10 MG tablet Take 1 tablet (10 mg total) by mouth 3 (three) times daily with meals. 02/07/19   Bensimhon, Shaune Pascal, MD  montelukast (SINGULAIR) 10 MG tablet Take 10 mg by mouth at bedtime.  09/28/16   [provider]  potassium chloride SA (KLOR-CON) 20 MEQ tablet Take 2 tablets (40 mEq total) by mouth 2 (two) times daily. 03/08/19   Clegg, Amy D, NP  simvastatin (ZOCOR) 10 MG  tablet Take 10 mg by mouth every evening.     [provider]  spironolactone (ALDACTONE) 25 MG tablet Take 0.5 tablets (12.5 mg total) by mouth every evening. 08/05/18   Shirley Friar, PA-C  torsemide (DEMADEX) 20 MG tablet Take 4 tablets (80 mg total) by mouth 2 (two) times daily. 03/08/19   Clegg, Amy D, NP  zolpidem (AMBIEN) 10 MG tablet Take 10 mg by mouth at bedtime as needed for sleep. For sleep     [provider]    Family History Family History  Problem Relation Age of Onset  . Alzheimer's disease Mother   . CVA Mother   . Hypertension Mother   . Cancer - Colon Father        late 59  . Birth defects Paternal Uncle     Social History Social History   Tobacco Use  . Smoking status: Former Smoker    Packs/day: 0.10    Years: 26.00    Pack years: 2.60    Types: Cigarettes    Quit date: 05/06/2007    Years since quitting: 11.8  . Smokeless tobacco: Never Used  Substance Use Topics  . Alcohol use: No  . Drug use: No     Allergies   Ceftin, Shellfish allergy, Geodon [ziprasidone hcl], Lisinopril, Allopurinol, Ativan [lorazepam], Sulfa antibiotics, and Ultram [tramadol hcl]   Review of Systems Review of Systems  Neurological: Positive for headaches.  All other systems reviewed and are negative.    Physical Exam Updated Vital Signs BP (!) 145/73 (BP Location: Right Arm)   Pulse 62   Temp 97.6 F (36.4 C) (Oral)   SpO2 100%   Physical  Exam Vitals signs and nursing note reviewed.  Constitutional:      Appearance: Normal appearance.  HENT:     Head:      Right Ear: External ear normal.     Left Ear: External ear normal.     Nose: Nose normal.     Mouth/Throat:     Mouth: Mucous membranes are moist.     Pharynx: Oropharynx is clear.  Eyes:     Extraocular Movements: Extraocular movements intact.     Conjunctiva/sclera: Conjunctivae normal.     Pupils: Pupils are equal, round, and reactive to light.  Neck:     Musculoskeletal: Normal range of motion and neck supple.  Cardiovascular:     Rate and Rhythm: Normal rate and regular rhythm.     Pulses: Normal pulses.     Heart sounds: Normal heart sounds.  Pulmonary:     Effort: Pulmonary effort is normal.     Breath sounds: Normal breath sounds.  Abdominal:     General: Abdomen is flat. Bowel sounds are normal.     Palpations: Abdomen is soft.  Musculoskeletal: Normal range of motion.  Skin:    General: Skin is warm.     Capillary Refill: Capillary refill takes less than 2 seconds.  Neurological:     General: No focal deficit present.     Mental Status: She is alert and oriented to person, place, and time.  Psychiatric:        Mood and Affect: Mood normal.        Behavior: Behavior normal.        Thought Content: Thought content normal.        Judgment: Judgment normal.      ED Treatments / Results  Labs (all labs ordered are listed, but only abnormal results  are displayed) Labs Reviewed - No data to display  EKG None  Radiology Ct Head Wo Contrast  Result Date: 03/11/2019 CLINICAL DATA:  Multiple falls, right head injury EXAM: CT HEAD WITHOUT CONTRAST TECHNIQUE: Contiguous axial images were obtained from the base of the skull through the vertex without intravenous contrast. COMPARISON:  08/10/2018 FINDINGS: Brain: No evidence of acute infarction, hemorrhage, hydrocephalus, extra-axial collection or mass lesion/mass effect. Stable bulky parafalcine  calcifications. Bilateral basal ganglia calcifications. Vascular: No hyperdense vessel or unexpected calcification. Skull: Normal. Negative for fracture or focal lesion. Sinuses/Orbits: The visualized paranasal sinuses are essentially clear. The mastoid air cells are unopacified. Other: None. IMPRESSION: Normal head CT. Electronically Signed   By: Julian Hy M.D.   On: 03/11/2019 14:55    Procedures Procedures (including critical care time)  Medications Ordered in ED Medications - No data to display   Initial Impression / Assessment and Plan / ED Course  I have reviewed the triage vital signs and the nursing notes.  Pertinent labs & imaging results that were available during my care of the patient were reviewed by me and considered in my medical decision making (see chart for details).       CT ok.  Pt is stable for d/c.  Return if worse.  Final Clinical Impressions(s) / ED Diagnoses   Final diagnoses:  Contusion of face, initial encounter  Fall, initial encounter    ED Discharge Orders    None       Isla Pence, MD 03/11/19 1503

## 2019-03-11 NOTE — Discharge Instructions (Signed)
67 year old female with head injury after tripping and falling, hitting the toilet bowl to right forehead/eyebrow.  Unknown loss of consciousness, states laid on the floor for 10 to 15 minutes before being able to get up.  She denies dizziness, weakness.  However, felt that there was a "crack" sound when she hit her head, and therefore came in for evaluation.  Given 68 year old, with unknown loss of consciousness, complicated medical history including CHF, orthostatic hypotension, will discharge to the ED for further evaluation management to rule out intracranial processes.

## 2019-03-11 NOTE — ED Notes (Signed)
Assessed by provider.  Patient verbalizes understanding of need for ER follow-up.  Patient provided with paperwork/provider note for check in.

## 2019-03-13 ENCOUNTER — Other Ambulatory Visit: Payer: Self-pay

## 2019-03-13 ENCOUNTER — Encounter: Payer: Self-pay | Admitting: Neurology

## 2019-03-13 ENCOUNTER — Ambulatory Visit (INDEPENDENT_AMBULATORY_CARE_PROVIDER_SITE_OTHER): Payer: Medicare HMO | Admitting: Neurology

## 2019-03-13 VITALS — BP 110/65 | HR 85 | Temp 98.0°F | Ht 61.0 in | Wt 169.0 lb

## 2019-03-13 DIAGNOSIS — R413 Other amnesia: Secondary | ICD-10-CM | POA: Diagnosis not present

## 2019-03-13 DIAGNOSIS — W19XXXD Unspecified fall, subsequent encounter: Secondary | ICD-10-CM | POA: Diagnosis not present

## 2019-03-13 NOTE — Progress Notes (Signed)
Follow-up Visit   Date: 03/13/19    Rhonda [name] MRN: [id number] DOB: 01-Jul-1950   Interim History: Rhonda [name] is a 68 y.o.  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  The patient was accompanied to the clinic by husband who also provides collateral information.    History of present illness: She reports having falls since November 2017, some which occurred by tripping over objects and loosing her balance.  Falls become much worse during 2018.  She often gets lightheaded preceding her falls and nearly always falls backwards.  Many of her falls were occurring in the kitchen while standing as she was preparing dinner.  She noticed that prolonged standing for 5 to 10 minutes always triggered her falls and was preceded by lightheadedness.  She was found to be orthostatic and started on midodrine 10 mg 3 times daily which reduced frequency of falls.    She has been diabetic for at least 10 years and has painful neuropathy of the feet, which is well-controlled on gabapentin 300mg  BID.  She takes extra gabapentin 800mg  as needed for severe pain, about once per week.   Over the past several years, she has noticed problems with short-term memory and family get frustrated with her because she is "slow".  She has a housekeeper that cleans her home.  She is able to cook and do laundry.  She manages her own medications and drives, but sometimes feels uncomfortable when driving.  She does not manage her finances.  On one occasion, she forgot how to put her cars into her ignition or to start the wipers.  She is driving and has not been involved in any MVAs.  Her family complained that she is repeating herself often and sometimes she talks nonsensically.  She manages  finances with her husband.  Prior work-up has included: CT cervical spine, CT brain.  Imaging showed increased sclerosis of the calvarium and vertebral bodies.  Her PTH was elevated and she is seeing endocrinology.  Findings also showed possible intracranial hypertension and follow-up LP showed mildly raised ICP at 76mm H20.  There has been no change in her gait or falls following the spinal tap.  She was started on Diamox 125mg  daily.  UPDATE 03/13/2019: She is here for follow-up visit.  There was delay in trying to get neuropsychological testing due provider leaving the practice, however, she is scheduled to have this performed with Dr. Melvyn Novas this week.  Her husband said that she is very forgetful, repeats herself often, and gets overwhelmed easily.  She reports feeling very depressed and is seeing a Social worker.  She continues to fall often, reporting 15 falls this year.  She has went to the ER from 2 falls, most recently on Saturday after bruising her right eye.  This fall occurred due to tripping over uneven ground at her home.  She tells me they are doing some home renovation projects.   Medications:  Current Outpatient Medications on File Prior to Visit  Medication Sig Dispense Refill  . acetaZOLAMIDE (DIAMOX) 125 MG tablet TAKE 1 TABLET(125 MG) BY MOUTH DAILY (Patient taking differently: Take 125 mg by mouth daily. ) 30 tablet 5  . buPROPion (WELLBUTRIN XL) 150 MG 24 hr tablet Take 450 mg by mouth every morning.     . carboxymethylcellulose (REFRESH  PLUS) 0.5 % SOLN Place 1 drop into both eyes 3 (three) times daily as needed (dry eyes).    . carvedilol (COREG) 3.125 MG tablet Take 3.125 mg by mouth 2 (two) times daily with a meal.    . citalopram (CELEXA) 20 MG tablet Take 20 mg by mouth daily.    . clonazePAM (KLONOPIN) 0.5 MG tablet Take 0.5 mg by mouth 3 (three) times daily as needed for anxiety.     . fexofenadine (ALLEGRA) 180 MG tablet Take 180 mg by mouth 2 (two) times daily.    Marland Kitchen  gabapentin (NEURONTIN) 300 MG capsule Take 300 mg by mouth 3 (three) times daily.     Marland Kitchen gabapentin (NEURONTIN) 800 MG tablet Take 800 mg by mouth at bedtime as needed (neuropathy).     Marland Kitchen ipratropium (ATROVENT) 0.03 % nasal spray Place 2 sprays into both nostrils 2 (two) times daily.    Marland Kitchen levothyroxine (SYNTHROID) 75 MCG tablet Take 1 tablet (75 mcg total) by mouth daily. 90 tablet 3  . metolazone (ZAROXOLYN) 2.5 MG tablet Take 1 tablet (2.5 mg total) by mouth daily as needed (edema). Take only as directed by CHF clinic    . midodrine (PROAMATINE) 10 MG tablet Take 1 tablet (10 mg total) by mouth 3 (three) times daily with meals. 90 tablet 3  . montelukast (SINGULAIR) 10 MG tablet Take 10 mg by mouth at bedtime.   1  . potassium chloride SA (KLOR-CON) 20 MEQ tablet Take 2 tablets (40 mEq total) by mouth 2 (two) times daily. 180 tablet 11  . simvastatin (ZOCOR) 10 MG tablet Take 10 mg by mouth every evening.     Marland Kitchen spironolactone (ALDACTONE) 25 MG tablet Take 0.5 tablets (12.5 mg total) by mouth every evening. 45 tablet 1  . torsemide (DEMADEX) 20 MG tablet Take 4 tablets (80 mg total) by mouth 2 (two) times daily. 240 tablet 6  . zolpidem (AMBIEN) 10 MG tablet Take 10 mg by mouth at bedtime as needed for sleep. For sleep      No current facility-administered medications on file prior to visit.     Allergies:  Allergies  Allergen Reactions  . Ceftin Anaphylaxis    Face and throat swell   . Shellfish Allergy Other (See Comments)    Gout exacerbation  . Geodon [Ziprasidone Hcl] Hives  . Lisinopril Other (See Comments)    angioedema  . Allopurinol Nausea Only and Other (See Comments)    weakness  . Ativan [Lorazepam] Itching  . Sulfa Antibiotics Itching  . Ultram [Tramadol Hcl] Itching    Review of Systems:  CONSTITUTIONAL: No fevers, chills, night sweats, or weight loss.  EYES: No visual changes or eye pain ENT: No hearing changes.  No history of nose bleeds.   RESPIRATORY: No cough,  wheezing and shortness of breath.  +lightheadedness CARDIOVASCULAR: Negative for chest pain, and palpitations.   GI: Negative for abdominal discomfort, blood in stools or black stools.  No recent change in bowel habits.   GU:  No history of incontinence.   MUSCLOSKELETAL: No history of joint pain or swelling.  No myalgias.   SKIN: Negative for lesions, rash, and itching.   ENDOCRINE: Negative for cold or heat intolerance, polydipsia or goiter.   PSYCH:  + depression +anxiety symptoms.   NEURO: As Above.   Vital Signs:  BP 110/65   Pulse 85   Temp 98 F (36.7 C)   Ht 5\' 1"  (1.549 m)   Wt 169 lb (  76.7 kg)   SpO2 99%   BMI 31.93 kg/m   No data found.  General Medical Exam:   General:  Well appearing, comfortable  Eyes/ENT: see cranial nerve examination.   Neck:   No carotid bruits. Respiratory:  Clear to auscultation, good air entry bilaterally.   Cardiac:  Regular rate and rhythm, no murmur.   Ext:  Mild edema of the legs  Neurological Exam: MENTAL STATUS including orientation to time, place, person, recent and remote memory is attention span and concentration, language, and fund of knowledge is fairly poor.  Follows commands easily.  Speech is slow and dysarthric (baseline).  Montreal Cognitive Assessment  07/27/2018 07/23/2017 07/31/2016  Visuospatial/ Executive (0/5) 2 4 5   Naming (0/3) 2 3 3   Attention: Read list of digits (0/2) 1 2 2   Attention: Read list of letters (0/1) 1 1 1   Attention: Serial 7 subtraction starting at 100 (0/3) 0 1 0  Language: Repeat phrase (0/2) 0 2 2  Language : Fluency (0/1) 0 1 1  Abstraction (0/2) 0 2 2  Delayed Recall (0/5) 1 0 0  Orientation (0/6) 6 6 6   Total 13 22 22   Adjusted Score (based on education) 13 22 22     CRANIAL NERVES:  Pupils are round and reactive to light. Extraocular muscles are intact throughout, except mild restriction with upgaze.  No ptosis.  Face is symmetric.   MOTOR:  Motor strength is 5/5 in all extremities. No  pronator drift.  Tone is normal.  No tremor.   COORDINATION/GAIT:  Finger to nose testing is intact. There is mild bradykinesia finger tapping.  Heel tapping intact.   Gait is slightly-wide based, unassisted.   Data: Labs 07/31/2016:  Vitamin B1 11, vitamin B12 597, copper 162, SPEP with IFE no M protein  CT head wo contrast performed at [facility] 11/24/2017: No acute intracranial abnormality appreciated.  Expanded empty sella may reflect idiopathic intracranial hypertension.  Thick dense calvarium which is of uncertain clinical significance.  Boney sclerosis can be seen with, but not limited to, hematological conditions such as myelofibrosis, metabolic bone disorders such as hyperthyroidism and hyperparathyroidism and other causes.  Correlate clinically.   CT cervical spine wo contrast 11/24/2017: 1.  No acute fracture 2.  No significant spinal canal or foraminal stenosis 3.  Increased sclerosis of the bony structures.  Boney sclerosis can be seen with, but not limited to, hematological conditions such as myelofibrosis, metabolic bone disorders such as hyperthyroidism and hyperparathyroidism and other causes.  Correlate clinically.    IMPRESSION/PLAN: 1.  Cognitive impairment, multifactorial.  Although she may have neurodegenerative condition, and will also feel that mood disorder and polypharmacy is contributing to her overall cognitive health. She is scheduled to have formal neurocognitive evaluation this week which will better characterize the nature of the symptoms.  2.  Gait abnormality and falls, multifactorial.  Risk factors: Orthostatic hypotension and sensory ataxia from diabetic neuropathy Continue 10 mg 3 times daily, which is prescribed by Dr. Haroldine Laws. Encouraged her to use a cane or walker as needed, especially on uneven ground. Make positional changes slowly.  3.  Intracranial hypertension OP 26cm H20 Continue Diamox 125 daily  4.  Diabetic neuropathy with painful  paresthesias and sensory ataxia Continue gabapentin 300 mg 3 times daily an extra 800 mg for severe pain.  Greater than 50% of this 25 minute visit was spent in counseling, explanation of diagnosis, planning of further management, and coordination of care.   Thank you for  allowing me to participate in patient's care.  If I can answer any additional questions, I would be pleased to do so.    Sincerely,

## 2019-03-15 ENCOUNTER — Ambulatory Visit: Payer: Medicare HMO

## 2019-03-15 ENCOUNTER — Encounter: Payer: Self-pay | Admitting: Psychology

## 2019-03-15 ENCOUNTER — Other Ambulatory Visit: Payer: Self-pay

## 2019-03-15 ENCOUNTER — Ambulatory Visit (INDEPENDENT_AMBULATORY_CARE_PROVIDER_SITE_OTHER): Payer: Medicare HMO | Admitting: Psychology

## 2019-03-15 DIAGNOSIS — R413 Other amnesia: Secondary | ICD-10-CM

## 2019-03-15 DIAGNOSIS — F411 Generalized anxiety disorder: Secondary | ICD-10-CM | POA: Diagnosis not present

## 2019-03-15 DIAGNOSIS — F332 Major depressive disorder, recurrent severe without psychotic features: Secondary | ICD-10-CM | POA: Diagnosis not present

## 2019-03-15 NOTE — Progress Notes (Addendum)
   Neuropsychology Note   Rhonda [name] completed 130 minutes of neuropsychological testing with technician, Cruzita Lederer, B.S., under the supervision of Dr. Christia Reading, Ph.D., licensed neuropsychologist. The patient did not appear overtly distressed by the testing session, per behavioral observation or via self-report to the technician. Rest breaks were offered.    In considering the patient's current level of functioning, level of presumed impairment, nature of symptoms, emotional and behavioral responses during the interview, level of literacy, and observed level of motivation/effort, a battery of tests was selected and communicated to the psychometrician.   Communication between the psychologist and technician was ongoing throughout the testing session and changes were made as deemed necessary based on patient performance on testing, technician observations and additional pertinent factors such as those listed above.   Rhonda [name] will return within approximately two weeks for an interactive feedback session with Dr. Melvyn Novas at which time his test performances, clinical impressions, and treatment recommendations will be reviewed in detail. The patient understands she can contact our office should she require our assistance before this time.   Full report to follow.  130 minutes were spent face-to-face with patient administering standardized tests and 15 minutes were spent scoring (technician). [CPT [id number], [id number]

## 2019-03-15 NOTE — Progress Notes (Signed)
NEUROPSYCHOLOGICAL EVALUATION [place] Department of Neurology  Reason for Referral:   CINIYAH TELLA is a 68 y.o. African-American female referred by Narda Amber, D.O., to characterize her current cognitive functioning and assist with diagnostic clarity and treatment planning in the context of subjective cognitive decline, frequent falls and balance instability, and several medical and psychiatric comorbidities.  Assessment and Plan:   Clinical Impression(s): The validity of neuropsychological testing is limited by the extent to which the individual being tested may be assumed to have exerted adequate effort and engagement throughout the process. Scores across stand-alone and embedded performance validity measures were variable, but generally below expectation. While I do believe that Ms. Cen is experiencing levels of cognitive dysfunction, she was noted to be experiencing significant fatigue throughout the evaluation and appeared to briefly fall asleep several times during task administration. As such, the results of the current evaluation should be interpreted with caution and likely underestimate Ms. Hufstedler's true cognitive abilities.  If taken at face value, Ms. Hyser's pattern of performance is suggestive of largely diffuse cognitive impairment, generally in the exceptionally low normative ranges. Relative strengths were exhibited across visuospatial functioning, receptive language, and a task assessing her ability to use good judgment in a variety of home-based settings. Memory performance was noteworthy for an amnestic memory profile. Coupled with poor performance in other domains, as well as the assumption that she has experienced cognitive decline given her Master's degree level of education, this could suggest the presence of a neurodegenerative illness. 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 However, given that she experienced varying levels of alertness throughout the evaluation, firm conclusions cannot be drawn regarding diagnostic clarity or the overall etiology of her deficits. Regarding psychological functioning, responses across questionnaires suggested severe symptoms of both depression and anxiety.   Recommendations: A repeat neuropsychological evaluation in 12 months when Ms. Worthington is able to complete testing in a more rested and alert state is recommended to better assess the severity of her cognitive deficits. This will also aid in future efforts towards improved diagnostic clarity.  A combination of medication and psychotherapy has been shown to be most effective at treating symptoms of anxiety and depression. As such, Ms. Bermea is encouraged to speak with her prescribing physician regarding medication adjustments to optimally manage these symptoms.   She would also benefit from individual psychotherapy to address symptoms of psychiatric distress. She would benefit from an active and collaborative therapeutic environment, rather than one purely supportive in nature. Recommended treatment modalities include Cognitive Behavioral Therapy (CBT) or Acceptance and Commitment Therapy (ACT). Therapy may have to be modified (e.g., reliance on written instructions and handouts) to accommodate memory and other cognitive difficulties.   Should there be a progression of her current deficits over time, Ms. Bourdon is unlikely to regain any independent living skills lost. Therefore, it is recommended that she remain as involved as possible in all aspects of household chores, finances, and medication management, with supervision to ensure adequate performance. She will likely benefit from the establishment and maintenance of a routine in order to  maximize her functional abilities over time.  It will be important for Ms. Deland to have another person with her when in situations where she may need to process information, weigh the pros and cons  of different options, and make decisions, in order to ensure that she fully understands and recalls all information to be considered.  Review of Records:   Ms. Ber was seen by [facility] Narda Amber, D.O.) on 03/13/2019 for memory changes and falls. Regarding the latter, falls were said to be occurring with high frequency since November 2017 (generally tripping over objects in her environment and/or losing her balance). These were said to become much worse in 2018, including instances where she would become lightheaded preceding falls, causing her to fall backwards. She was eventually found to be orthostatic, started on relevant medications, and improved somewhat. Regarding cognitive concerns, she reported difficulties with short-term memory and slowed processing speed. ADLs were said to be variable. Ms. Fatima is able to cook and do laundry, manage medications, and drive short distances. Financial management was aided by her husband. Most recently, her husband reported an increase in cognitive dysfunction (namely memory) over the past 1 year, as well as continued falls (15 reported in the past year). Subsequently, Ms. Boonstra was referred for a comprehensive neuropsychological evaluation to characterize her cognitive abilities and to assist with diagnostic clarity and treatment planning.  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 A follow-up lumbar puncture showed mildly raised intracranial pressure. However, no changes in her gait or frequency of falls were reported following her spinal tap. Head CT on 07/26/2018 revealed minimal atrophy. Head CT on 08/10/2018 revealed unchanged mild atrophy and bulky parafalcine  calcifications. Head CTs on 11/08/2018 and 03/11/2019 were read as unremarkable.   Past Medical History:  Diagnosis Date   Anemia    Arthritis    Right knee   Asthma    Back pain    Disk problem   Breast cancer, stage 1 ([place]) 03/26/2011   Left; completed chemotherapy and radiation treatments   Cardiomyopathy    Chronic respiratory failure ([place]) [id number]   Chronic systolic heart failure (HCC)    a) NICM b) ECHO (03/2013) EF 20-25% c) ECHO (09/2013) EF 45-50%, grade I DD   CKD (chronic kidney disease) stage 3, GFR 30-59 ml/min 0000000   Complication of anesthesia    History of low blood pressure after surgery; attributed to lying flat   Diabetes mellitus    "diet controlled" (05/03/2013)   Exertional shortness of breath    Generalized anxiety disorder    GERD (gastroesophageal reflux disease)    Gout    Heart murmur    Hyperlipidemia    Hypertension    Hypokalemia 11/08/2018   Hypothyroidism    Left bundle branch block    s/p CRT-D (04/2013)   Major depressive disorder    Migraines    On home oxygen therapy    "2L suppose to be q night" (05/03/2013)   Orthostatic hypotension 07/28/2017   Peripheral neuropathy    Feet   SVD (spontaneous vaginal delivery)    x 2   Unspecified vitamin D deficiency 03/26/2011   Does not take meds    Past Surgical History:  Procedure Laterality Date   BI-VENTRICULAR IMPLANTABLE CARDIOVERTER DEFIBRILLATOR N/A 05/03/2013   Procedure: BI-VENTRICULAR IMPLANTABLE CARDIOVERTER DEFIBRILLATOR  (CRT-D);  Surgeon: Evans Lance, MD;  Location: [facility] CATH LAB;  Service: Cardiovascular;  Laterality: N/A;   BI-VENTRICULAR IMPLANTABLE CARDIOVERTER DEFIBRILLATOR  (CRT-D)  05/03/2013   MDT CRTD implanted by Dr Lovena Le for non ischemic cardiomyopathy   BIOPSY  05/31/2018   Procedure: BIOPSY;  Surgeon: Ronnette Juniper,  MD;  Location: [facility] ENDOSCOPY;  Service: Gastroenterology;;   BREAST LUMPECTOMY Left 07/28/2010   CARDIAC  CATHETERIZATION  2010   NORMAL CORONARY ARTERIES   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--11 SURGERIES     PT HAS REMOVABLE SPEECH BULB-TAKES IT OUT BEFORE HER SURGERY   COLONOSCOPY     COLONOSCOPY N/A 05/31/2018   Procedure: COLONOSCOPY;  Surgeon: Ronnette Juniper, MD;  Location: [facility];  Service: Gastroenterology;  Laterality: N/A;   HYSTEROSCOPY W/D&C  01/20/2012   Procedure: DILATATION AND CURETTAGE /HYSTEROSCOPY;  Surgeon: Margarette Asal, MD;  Location: [facility];  Service: Gynecology;  Laterality: N/A;  with trueclear   PORT-A-CATH REMOVAL  09/23/2011   Procedure: REMOVAL PORT-A-CATH;  Surgeon: Rolm Bookbinder, MD;  Location: [facility];  Service: General;  Laterality: N/A;  Port Removal   PORTACATH PLACEMENT  2012   TONSILLECTOMY  1960's   TOTAL KNEE ARTHROPLASTY  05/10/2012   Procedure: TOTAL KNEE ARTHROPLASTY;  Surgeon: Mauri Pole, MD;  Location: [facility];  Service: Orthopedics;  Laterality: Right;   WISDOM TOOTH EXTRACTION      Family History  Problem Relation Age of Onset   Alzheimer's disease Mother    CVA Mother    Hypertension Mother    Cancer - Colon Father        late 89   Birth defects Paternal Uncle      Current Outpatient Medications:    acetaZOLAMIDE (DIAMOX) 125 MG tablet, TAKE 1 TABLET(125 MG) BY MOUTH DAILY (Patient taking differently: Take 125 mg by mouth daily. ), Disp: 30 tablet, Rfl: 5   buPROPion (WELLBUTRIN XL) 150 MG 24 hr tablet, Take 450 mg by mouth every morning. , Disp: , Rfl:    carboxymethylcellulose (REFRESH PLUS) 0.5 % SOLN, Place 1 drop into both eyes 3 (three) times daily as needed (dry eyes)., Disp: , Rfl:    carvedilol (COREG) 3.125 MG tablet, Take 3.125 mg by mouth 2 (two) times daily with a meal., Disp: , Rfl:    citalopram (CELEXA) 20 MG tablet, Take 20 mg by mouth daily., Disp: , Rfl:    clonazePAM (KLONOPIN) 0.5 MG tablet, Take 0.5 mg by mouth 3 (three) times daily as needed for anxiety. , Disp: , Rfl:    fexofenadine  (ALLEGRA) 180 MG tablet, Take 180 mg by mouth 2 (two) times daily., Disp: , Rfl:    gabapentin (NEURONTIN) 300 MG capsule, Take 300 mg by mouth 3 (three) times daily. , Disp: , Rfl:    gabapentin (NEURONTIN) 800 MG tablet, Take 800 mg by mouth at bedtime as needed (neuropathy). , Disp: , Rfl:    ipratropium (ATROVENT) 0.03 % nasal spray, Place 2 sprays into both nostrils 2 (two) times daily., Disp: , Rfl:    levothyroxine (SYNTHROID) 75 MCG tablet, Take 1 tablet (75 mcg total) by mouth daily., Disp: 90 tablet, Rfl: 3   metolazone (ZAROXOLYN) 2.5 MG tablet, Take 1 tablet (2.5 mg total) by mouth daily as needed (edema). Take only as directed by CHF clinic, Disp: , Rfl:    midodrine (PROAMATINE) 10 MG tablet, Take 1 tablet (10 mg total) by mouth 3 (three) times daily with meals., Disp: 90 tablet, Rfl: 3   montelukast (SINGULAIR) 10 MG tablet, Take 10 mg by mouth at bedtime. , Disp: , Rfl: 1   potassium chloride SA (KLOR-CON) 20 MEQ tablet, Take 2 tablets (40 mEq total) by mouth 2 (two) times daily., Disp: 180 tablet, Rfl: 11   simvastatin (ZOCOR) 10 MG tablet,  Take 10 mg by mouth every evening. , Disp: , Rfl:    spironolactone (ALDACTONE) 25 MG tablet, Take 0.5 tablets (12.5 mg total) by mouth every evening., Disp: 45 tablet, Rfl: 1   torsemide (DEMADEX) 20 MG tablet, Take 4 tablets (80 mg total) by mouth 2 (two) times daily., Disp: 240 tablet, Rfl: 6   zolpidem (AMBIEN) 10 MG tablet, Take 10 mg by mouth at bedtime as needed for sleep. For sleep , Disp: , Rfl:   Clinical Interview:   Cognitive Symptoms: Decreased short-term memory: Endorsed. Provided examples included misplacing objects around the home, forgetting her intention when entering a room, trouble remembering the details of previous conversations or names of familiar individuals, and requiring others to repeat themselves often. Ms. Monter noted that these difficulties have exhibited a gradual decline over the past 2-3 years, where  some days appear worse than others. Her husband noted a more precipitous decline over the past 10 months.  Decreased long-term memory: Denied. Decreased attention/concentration: Endorsed. She reported longstanding mild difficulties with distractibility, which appear stable. Reduced processing speed: Endorsed. Difficulties with executive functions: Denied. Difficulties with emotion regulation: Denied. Difficulties with receptive language: Denied. However, her husband did note some trouble with comprehension and interpretation at times.  Difficulties with word finding: Endorsed. These were also said to have worsened over the past few years.  Decreased visuoperceptual ability: Endorsed. In addition to being "clumsy," Ms. Fetz reported a history of numerous falls, often due to her tripping over things in her environment. This has been ongoing for several years.   Difficulties completing ADLs: Variably so. Ms. Sesto denied difficulties managing personal finances or driving; however, regarding the latter, she reported only feeling comfortable driving short distances to familiar locations and noted a remote instance of getting turned around and briefly lost. Her husband noted that she has an individual come to the house once a week to help organize her medications. Ms. Ellard reported generally good medication adherence with this form of assistance.   Additional Medical History: History of traumatic brain injury/concussion: Unclear. Ms. Erkkila and her husband denied her ever being diagnosed with a mild traumatic brain injury/concussion. However, she does have a history of frequent falls, including several where she has hit her head. Persisting post-concussion symptoms from these falls were largely denied.  History of stroke: Denied. History of seizure activity: Denied. History of known exposure to toxins: Denied. Symptoms of chronic pain: Endorsed. Symptoms of peripheral neuropathy in her feet were reported.  Medical records also suggest a history of back pain due to a "disk problem." Experience of frequent headaches/migraines: Denied. She did acknowledge a remote history of migraine headaches.  Frequent instances of dizziness/vertigo: Endorsed.While dizziness was said to be a contributing factor in several of her falls, this was not universally true. Most recently, she reported symptoms of lightheadedness preceding her falls, generally when she has been on her feet for extended periods of time while working in the kitchen.   Sensory changes: Ms. Koep wears corrective lenses with positive effect. She denied other sensory changes/difficulties (e.g., hearing, taste, or smell). Balance/coordination difficulties: Endorsed. As described above, Ms. Alen has a history of very frequent falls, including a reported 15 in the last year alone. She also noted gait abnormalities, including a history of stumbling, and will occasionally ambulate with the assistance of a cane. Falls were said to occur either forwards or backwards and have increased over the past 1 year especially.  Other motor difficulties: Denied.  Other medical conditions:  Endorsed. Ms. Szopinski was diagnosed with breast cancer in 2012. She underwent a lumpectomy in March 2020, several rounds of chemotherapy in June 2012, and radiation in August 2012. Following treatment, she denied any observable persisting cognitive difficulties. However, her husband noted that she lost her hair a second time long after treatment ended, which was particularly traumatic for Ms. Etchison and has created ongoing self-esteem and mood-related difficulties.   Sleep History: Estimated hours obtained each night: 8 hours. Difficulties falling asleep: Denied. She noted that medications were helpful in aiding her in falling asleep. Difficulties staying asleep: Denied. Feels rested and refreshed upon awakening: Denied. She reported waking feeling tired and needing to lay in bed for a while  each morning.  History of snoring: Denied. History of waking up gasping for air: Denied. Witnessed breath cessation while asleep: Denied.  History of vivid dreaming: Denied. Excessive movement while asleep: Denied. Instances of acting out her dreams: Endorsed. While potentially unrelated to dreams, she did report sleep-eating, which occurs approximately 1-2 times per week.   Psychiatric/Behavioral Health History: Depression: Endorsed. Ms. Bradsher reported somewhat longstanding symptoms of depression, dating back at least to her chemotherapy treatment. Her husband also noted that she lost her hair long after the conclusion of this treatment, which has been particularly traumatic for her and has caused some diminished symptoms of self-esteem. Ms. Peikert described her current mood as "terrible." She reported that oral medications were somewhat helpful. Her and her husband were engaged in couples counseling prior to the COVID-19 pandemic. This was described as unhelpful, with Ms. Hoben stating that she desired to work with an Haematologist. Current or remote suicidal ideation, intent, or plan were denied; however, Ms. Remsen did appear to hesitate when denying the presence of these symptoms.   Anxiety: Endorsed. Symptoms of anxiety were also described as somewhat longstanding in nature and largely attributed to a combination of the current state of the world, as well as a variety of personal psychosocial stressors.  Mania: Denied. Trauma History: Denied outside what is described above. Visual/auditory hallucinations: Denied. Delusional thoughts: Denied.  Tobacco: Endorsed. She reported consuming 1-2 cigarettes "every once in a while." Alcohol: Ms. Lastrapes denied current alcohol consumption, as well as a history of problematic alcohol use, abuse, or dependence.  Recreational drugs: Denied. Caffeine: She reported consuming 1 cup of coffee in the morning.   Academic/Vocational History: Highest level of  educational attainment: 18 years. She reported earning her Bachelor's degree in art education and a Master's degree in deafness rehabilitation. She described herself as a good (A/B) student in academic settings.  History of developmental delay: Denied. History of grade repetition: Denied. History of class failures: Denied. Enrollment in special education courses: Denied. History of diagnosed specific learning disability: Denied. History of ADHD: Denied.  Employment: Ms. Iannone is currently disabled due to her history of congestive heart failure.   Evaluation Results:   Behavioral Observations: Ms. Wildt was accompanied by her husband, arrived to her appointment on time, and was appropriately dressed and groomed. Her gait was somewhat slowed and appeared mildly unstable at times. However, she did not require external sources for stabilization. She did not utilize a cane despite what has been recommended to her in the past. Gross motor functioning appeared intact upon informal observation and no abnormal movements (e.g., tremors) were noted. Her affect was notably flat throughout both the interview and the evaluation. It was unclear if this was due to symptoms of fatigue, depression, or a combination of both. Spontaneous  speech was largely fluent. However, word pronunciation was difficult to understand and seemingly slurred at times. Other times, words appeared jumbled together, making them difficult to discern. Word finding difficulties were not observed during the clinical interview. Sustained attention was variable. Ms. Tincher expressed fatigue throughout the evaluation and appeared to briefly fall asleep several times during test administration. She also required a 20 minute break mid-way through testing in order to eat an early lunch. Thought processes were largely coherent, organized, and normal in content; however, she was noted to complain about the testing process throughout. Task engagement was  largely adequate, although there were some periods where she exhibited brief disengagement, assumed to be related to increasing symptoms of fatigue; the evaluation was shortened in response. Notably, following the completion of testing, Ms. Cygan stated that she had not slept the previous night due to an unexpected death in her extended family occurring the night before.   Adequacy of Effort: The validity of neuropsychological testing is limited by the extent to which the individual being tested may be assumed to have exerted adequate effort during testing. Ms. Stokoe expressed her intention to perform to the best of her abilities and exhibited adequate task engagement and persistence. Scores across stand-alone and embedded performance validity measures were variable, but generally below expectation. While below expectation performances could certainly be due to cognitive dysfunction, Ms. Raglin was noted to be experiencing significant fatigue throughout the evaluation and appeared to briefly fall asleep several times during task administration. As such, the results of the current evaluation should be interpreted with caution.  Test Results: Ms. Milward was fully oriented at the time of the current evaluation.  Intellectual abilities based upon educational and vocational attainment were estimated to be in the average to above average range. Premorbid abilities were estimated to be within the below average range based upon a single-word reading test.   Processing speed was exceptionally low. Basic attention was below average. More complex attention (e.g., working memory) was exceptionally low. Performance across tasks assessing executive functions (e.g., cognitive flexibility, response inhibition, nonverbal abstract reasoning, analytical problem solving) were exceptionally low.  Assessed receptive language abilities were within normal limits. However, Ms. Zawistowski did exhibit difficulties comprehending task  instructions throughout testing procedures, especially as complexity increased. Assessed expressive language (e.g., verbal fluency and confrontation naming) was well below average to exceptionally low.     Assessed visuospatial/visuoconstructional abilities were within normal limits.    Learning (i.e., encoding) of novel verbal and visual information was exceptionally low to below average. Spontaneous delayed recall (i.e., retrieval) of previously learned information exceptionally low and Ms. Benish exhibited an amnestic profile. Performance across recognition tasks was likewise exceptionally low, suggesting very minimal evidence for appropriate information consolidation.   Results of emotional screening instruments suggested that recent symptoms of generalized anxiety were in the severe range, while symptoms of depression were also within the severe range. A screening instrument assessing recent sleep quality suggested the presence of minimal sleep dysfunction.  Tables of Scores:   Note: This summary of test scores accompanies the interpretive report and should not be considered in isolation without reference to the appropriate sections in the text. Descriptors are based on appropriate normative data and may be adjusted based on clinical judgment. The terms impaired and within normal limits (WNL) are used when a more specific level of functioning cannot be determined.       Effort Testing:   DESCRIPTOR       ACS Word Choice: --- ---  Below Expectation  Dot Counting Test: --- --- Within Expectation  CVLT-III Forced Choice Recognition: --- --- Below Expectation  BVMT-R Retention Percentage: --- --- Below Expectation       Orientation:      Raw Score Percentile   NAB Orientation, Form 1 29/29 --- ---       Intellectual Functioning:           Standard Score Percentile   Test of Premorbid Functioning: 80 9 Below Average       Memory:          [employer] Learning Test  (CVLT-III) Brief Form: Raw Score (Scaled/Standard Score) Percentile     Total Trials 1-4 23/36 (83) 13 Below Average    Short-Delay Free Recall 4/9 (3) 1 Exceptionally Low    Long-Delay Free Recall 0/9 (1) <1 Exceptionally Low    Long-Delay Cued Recall 3/9 (2) <1 Exceptionally Low      Recognition Hits 9/9 (12) 75 Above Average      False Positive Errors 16 (1) <1 Exceptionally Low       Brief Visuospatial Memory Test (BVMT-R), Form 1: Raw Score (T Score) Percentile     Total Trials 1-3 8/36 (25) 1 Exceptionally Low    Delayed Recall 0/12 (20) <1 Exceptionally Low    Recognition Discrimination Index -1 <1 Exceptionally Low      Recognition Hits 2/6 1-2 Exceptionally Low      False Positive Errors 3 <1 Exceptionally Low       Attention/Executive Function:          Trail Making Test (TMT): Raw Score (T Score) Percentile     Part A 89 secs.,  0 errors (23) <1 Exceptionally Low    Part B DC'D @ 300,  3 errors --- Impaired        Scaled Score Percentile   WAIS-IV Coding: 3 1 Exceptionally Low       NAB Attention Module, Form 1: T Score Percentile     Digits Forward 37 9 Below Average    Digits Backwards 25 1 Exceptionally Low       D-KEFS Color-Word Interference Test: Raw Score (Scaled Score) Percentile     Color Naming 78 secs. (1) <1 Exceptionally Low    Word Reading 54 secs. (1) <1 Exceptionally Low    Inhibition 180 secs. (1) <1 Exceptionally Low      Total Errors 11 errors (1) <1 Exceptionally Low    Inhibition/Switching 180 secs. (1) <1 Exceptionally Low      Total Errors 13 errors (1) <1 Exceptionally Low       D-KEFS 20 Questions Test: Scaled Score Percentile     Total Weighted Achievement Score 1 <1 Exceptionally Low    Initial Abstraction Score 6 9 Below Average       NAB Executive Functions Module, Form 1: T Score Percentile     Judgment 47 38 Average       Language:          Verbal Fluency Test: Raw Score (T Score) Percentile     Phonemic Fluency (FAS) 24  (33) 5 Well Below Average    Animal Fluency 11 (34) 5 Well Below Average       NAB Language Module, Form 2: T Score Percentile     Auditory Comprehension 53 62 Average    Naming 27/31 (28) 2 Exceptionally Low       Visuospatial/Visuoconstruction:      Raw Score Percentile   Clock  Drawing: 10/10 --- Within Normal Limits       NAB Spatial Module, Form 2: T Score Percentile     Figure Drawing Copy 56 73 Average       Mood and Personality:      Raw Score Percentile   Geriatric Depression Scale: 22 --- Severe  Geriatric Anxiety Scale: 31 --- Severe    Somatic 10 --- Moderate    Cognitive 11 --- Severe    Affective 10 --- Severe       Additional Questionnaires:      Raw Score Percentile   PROMIS Sleep Disturbance Questionnaire: 13 --- None to Slight   Informed Consent and Coding/Compliance:   Ms. Trolinger was provided with a verbal description of the nature and purpose of the present neuropsychological evaluation. Also reviewed were the foreseeable risks and/or discomforts and benefits of the procedure, limits of confidentiality, and mandatory reporting requirements of this provider. The patient was given the opportunity to ask questions and receive answers about the evaluation. Oral consent to participate was provided by the patient.   This evaluation was conducted by Christia Reading, Ph.D., licensed clinical neuropsychologist. Ms. Dishon completed a 30-minute clinical interview, billed as one unit [phone], and 145 minutes of cognitive testing, billed as one unit [phone] and four additional units [phone]. Psychometrist Cruzita Lederer, B.S., assisted Dr. Melvyn Novas with test administration and scoring procedures. As a separate and discrete service, Dr. Melvyn Novas spent a total of 180 minutes in interpretation and report writing, billed as one unit [id number] and two units [id number].

## 2019-03-16 ENCOUNTER — Encounter: Payer: Self-pay | Admitting: Psychology

## 2019-03-20 ENCOUNTER — Ambulatory Visit (INDEPENDENT_AMBULATORY_CARE_PROVIDER_SITE_OTHER): Payer: Medicare HMO

## 2019-03-20 DIAGNOSIS — I5022 Chronic systolic (congestive) heart failure: Secondary | ICD-10-CM

## 2019-03-20 DIAGNOSIS — Z95810 Presence of automatic (implantable) cardiac defibrillator: Secondary | ICD-10-CM | POA: Diagnosis not present

## 2019-03-21 NOTE — Progress Notes (Signed)
EPIC Encounter for ICM Monitoring  Patient Name: Rhonda [name] is a 68 y.o. female Date: 03/21/2019 Primary Care Physican: Maurice Small, MD Primary Cardiologist:Bensimhon Electrophysiologist: Santina Evans Pacing: 99.8% 02/06/2019 Weight: 173lbs(at office visit)   Transmission reviewed.  Optivol thoracic impedancecontinues to suggestpossibleongoingfluid accumulation since February but has improved since September.  Prescribed:Torsemide 20 mg4tablets (80 mg total)by mouthtwice a day.Potassium 20 meq2tablets (40 mEq total)twice a day. Metolazone 2.5mg  1 tablet by mouth if needed for fluid or edema.  Labs: 01/11/2019 Creatinine 1.84, BUN 35, Potassium 4.2, Sodium 140, GFR 28-33 11/10/2018 Creatinine1.21, BUN25, Potassium3.9, Sodium138, [id number] 11/09/2018 Creatinine1.53, BUN22, Potassium2.9, [id number], [id number]  11/08/2018 Creatinine1.75, BUN25, Potassium<2.0, Sodium141, [id number]  10/27/2018 Creatinine1.44, BUN15, Potassium5.2, [id number], [id number] A complete set of results can be found in Results Review.  Recommendations: None.  Follow-up plan: ICM clinic phone appointment on 05/01/2019.   91 day device clinic remote transmission 04/19/2019.    Copy of ICM check sent to Dr. Lovena Le and Dr Haroldine Laws.   3 month ICM trend: 03/20/2019    1 Year ICM trend:       [name], RN 03/21/2019 12:34 PM

## 2019-03-22 ENCOUNTER — Encounter: Payer: Self-pay | Admitting: Psychology

## 2019-03-22 ENCOUNTER — Other Ambulatory Visit: Payer: Self-pay

## 2019-03-22 ENCOUNTER — Other Ambulatory Visit (HOSPITAL_COMMUNITY): Payer: Self-pay

## 2019-03-22 ENCOUNTER — Ambulatory Visit (INDEPENDENT_AMBULATORY_CARE_PROVIDER_SITE_OTHER): Payer: Medicare HMO | Admitting: Psychology

## 2019-03-22 DIAGNOSIS — R4189 Other symptoms and signs involving cognitive functions and awareness: Secondary | ICD-10-CM

## 2019-03-22 NOTE — Progress Notes (Signed)
   Neuropsychology Feedback Session Rhonda [name]. [facility] Department of Neurology  Reason for Referral:   Rhonda [name] a 68 y.o. African-American female referred by Narda Amber, D.O.,to characterize hercurrent cognitive functioning and assist with diagnostic clarity and treatment planning in the context of subjective cognitive decline, frequent falls and balance instability, and several medical and psychiatric comorbidities.  Feedback:   [name] completed a comprehensive neuropsychological evaluation on 03/15/2019. Please refer to that encounter for the full report. Briefly, given below expectation performance across validity indicators and observations surrounding notable fatigue (including a time where [name] briefly fell asleep) during testing, results of the current evaluation should be interpreted with caution and likely underestimate [name]'s true cognitive abilities. While I do believe that she is experiencing significant cognitive difficulties, the extent of these difficulties and their potential etiology cannot be determined at this time. Recommendations included a repeat neuropsychological evaluation in 12 months when [name] is more rested and able to focus more effectively, as well as more collaborative efforts with her therapist in treating severe symptoms of both depression and anxiety.  [name] was accompanied by her husband. Content of the current session focused on the results of the evaluation, as well as efforts surrounding psychotherapy. While she reported being engaged in psychotherapy, it was unclear if this was more supportive in nature. Therapy emphasizing specific strategies for dealing with distress (as well as [name]'s role in practicing these strategies outside of the therapy session) were emphasized. [name] and her husband were given the opportunity to ask questions and their questions were answered. They were encouraged to reach out  should additional questions arise. A copy of her report was provided at the conclusion of the visit.      A total of 25 minutes were spent with [name] during the current feedback session.

## 2019-03-22 NOTE — Progress Notes (Signed)
Paramedicine Encounter    Patient ID: Rhonda [name], female    DOB: [birth year], 68 y.o.   MRN: [id number]    Patient Care Team: Maurice Small, MD as PCP - General (Family Medicine) [name], LCSW as Social Worker (Licensed Clinical Social Worker)  Patient Active Problem List   Diagnosis Date Noted  . Hypokalemia 11/08/2018  . CKD (chronic kidney disease) stage 3, GFR 30-59 ml/min 11/08/2018  . Orthostatic hypotension 07/28/2017  . SVD (spontaneous vaginal delivery)   . Peripheral neuropathy   . On home oxygen therapy   . Migraines   . Left bundle branch block   . Hypothyroidism   . Hypertension   . Hyperlipidemia   . Heart murmur   . GERD (gastroesophageal reflux disease)   . Exertional shortness of breath   . Major depressive disorder   . Back pain   . Arthritis   . Generalized anxiety disorder   . Anemia   . Chronic respiratory failure ([place]) 09/14/2013  . 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 Morbid obesity ([place]) 11/01/2012  . Chronic systolic heart failure ([place]) 10/27/2012  . Endometrial polyp 01/20/2012  . Unspecified vitamin D deficiency 03/26/2011    Current Outpatient Medications:  .  acetaZOLAMIDE (DIAMOX) 125 MG tablet, TAKE 1 TABLET(125 MG) BY MOUTH DAILY (Patient taking differently: Take 125 mg by mouth daily. ), Disp: 30 tablet, Rfl: 5 .  buPROPion (WELLBUTRIN XL) 150 MG 24 hr tablet, Take 450 mg by mouth every morning. , Disp: , Rfl:  .  carvedilol (COREG) 3.125 MG tablet, Take 3.125 mg by mouth 2 (two) times daily with a meal., Disp: , Rfl:  .  citalopram (CELEXA) 20 MG tablet, Take 20 mg by mouth daily., Disp: , Rfl:  .  clonazePAM (KLONOPIN) 0.5 MG tablet, Take 0.5 mg by mouth 3 (three) times daily as needed for anxiety. , Disp: , Rfl:  .  gabapentin (NEURONTIN) 300 MG capsule, Take 300 mg by mouth 3 (three) times daily. , Disp: , Rfl:  .  levothyroxine (SYNTHROID) 75 MCG tablet, Take 1 tablet (75 mcg total)  by mouth daily., Disp: 90 tablet, Rfl: 3 .  metolazone (ZAROXOLYN) 2.5 MG tablet, Take 1 tablet (2.5 mg total) by mouth daily as needed (edema). Take only as directed by CHF clinic, Disp: , Rfl:  .  midodrine (PROAMATINE) 10 MG tablet, Take 1 tablet (10 mg total) by mouth 3 (three) times daily with meals., Disp: 90 tablet, Rfl: 3 .  potassium chloride SA (KLOR-CON) 20 MEQ tablet, Take 2 tablets (40 mEq total) by mouth 2 (two) times daily., Disp: 180 tablet, Rfl: 11 .  simvastatin (ZOCOR) 10 MG tablet, Take 10 mg by mouth every evening. , Disp: , Rfl:  .  spironolactone (ALDACTONE) 25 MG tablet, Take 0.5 tablets (12.5 mg total) by mouth every evening., Disp: 45 tablet, Rfl: 1 .  torsemide (DEMADEX) 20 MG tablet, Take 4 tablets (80 mg total) by mouth 2 (two) times daily., Disp: 240 tablet, Rfl: 6 .  carboxymethylcellulose (REFRESH PLUS) 0.5 % SOLN, Place 1 drop into both eyes 3 (three) times daily as needed (dry eyes)., Disp: , Rfl:  .  fexofenadine (ALLEGRA) 180 MG tablet, Take 180 mg by mouth 2 (two) times daily., Disp: , Rfl:  .  gabapentin (NEURONTIN) 800 MG tablet, Take 800 mg by mouth at bedtime as needed (neuropathy). , Disp: , Rfl:  .  ipratropium (ATROVENT) 0.03 % nasal spray, Place 2 sprays into  both nostrils 2 (two) times daily., Disp: , Rfl:  .  montelukast (SINGULAIR) 10 MG tablet, Take 10 mg by mouth at bedtime. , Disp: , Rfl: 1 .  zolpidem (AMBIEN) 10 MG tablet, Take 10 mg by mouth at bedtime as needed for sleep. For sleep , Disp: , Rfl:  Allergies  Allergen Reactions  . Ceftin Anaphylaxis    Face and throat swell   . Shellfish Allergy Other (See Comments)    Gout exacerbation  . Geodon [Ziprasidone Hcl] Hives  . Lisinopril Other (See Comments)    angioedema  . Allopurinol Nausea Only and Other (See Comments)    weakness  . Ativan [Lorazepam] Itching  . Sulfa Antibiotics Itching  . Ultram [Tramadol Hcl] Itching     Social History   Socioeconomic History  . Marital  status: Married    Spouse name: Not on file  . Number of children: 2  . Years of education: 64  . Highest education level: Master's degree (e.g., MA, MS, MEng, MEd, MSW, MBA)  Occupational History  . Not on file  Social Needs  . Financial resource strain: Not on file  . Food insecurity    Worry: Not on file    Inability: Not on file  . Transportation needs    Medical: Not on file    Non-medical: Not on file  Tobacco Use  . Smoking status: Former Smoker    Packs/day: 0.10    Years: 26.00    Pack years: 2.60    Types: Cigarettes    Quit date: 05/06/2007    Years since quitting: 11.8  . Smokeless tobacco: Never Used  Substance and Sexual Activity  . Alcohol use: No  . Drug use: No  . Sexual activity: Yes  Lifestyle  . Physical activity    Days per week: Not on file    Minutes per session: Not on file  . Stress: Not on file  Relationships  . Social Herbalist on phone: Not on file    Gets together: Not on file    Attends religious service: Not on file    Active member of club or organization: Not on file    Attends meetings of clubs or organizations: Not on file    Relationship status: Not on file  . Intimate partner violence    Fear of current or ex partner: Not on file    Emotionally abused: Not on file    Physically abused: Not on file    Forced sexual activity: Not on file  Other Topics Concern  . Not on file  Social History Narrative   Tobacco Use Cigarettes: Former Smoker, Quit in 2008   No Alcohol   No recreational drug use   Diet: Regular/Low Carb   Exercise: None   Occupation: disabled   Education: Research officer, political party, masters   Children: 2   Firearms: No   Therapist, art Use: Always   Former Metallurgist.        Physical Exam Pulmonary:     Effort: No respiratory distress.     Breath sounds: No wheezing.  Musculoskeletal:     Right lower leg: Edema present.     Left lower leg: Edema present.  Skin:    General: Skin is warm and dry.          Future Appointments  Date Time Provider [place]  03/22/2019  2:30 PM Hazle Coca, PhD [facility] None  03/30/2019  3:00 PM [facility] PA/NP [facility]  None  04/07/2019 10:50 AM Narda Amber K, DO [facility] None  04/19/2019  7:30 AM [facility] DEVICE REMOTES [facility]  05/01/2019  7:15 AM [facility] DEVICE REMOTES [facility]  06/14/2019 10:00 AM Philemon Kingdom, MD [facility] None  07/19/2019  7:30 AM [facility] DEVICE REMOTES [facility]  10/18/2019  7:30 AM [facility] DEVICE REMOTES [facility]  01/17/2020  7:30 AM [facility] DEVICE REMOTES [facility]  04/17/2020  7:30 AM [facility] DEVICE REMOTES [facility]     BP 108/68 (BP Location: Right Arm, Patient Position: Sitting, Cuff Size: Normal)   Pulse 71   Temp 97.7 F (36.5 C)   SpO2 97%   Weight yesterday- Last visit weight-170 (10/14)  ATF pt CAO x4 getting dressed. She has an appointment with the neurologist today at 230.  She has taken her morning medications but she's chosen to take the afternoon meds when she gets home which includes torsemide. She denies increase in sob, chest pain and dizziness.  Mrs. Bohner missed one dose of medications last week.  She said that she hasn't weighed and said that she doesn't feel like weighing right now.  She also said that she "has been off with weighing lately".  She fell over the weekend and hit her face on the toilet.  She denies neck and back pain at this time.  She said that she's very nervous about the appointment, so she took an additional clonazepam.     Medication ordered: Midodrine filled until mon 2nd box  Bliss, EMT Paramedic [phone] 03/22/2019    ACTION: Home visit completed

## 2019-03-24 ENCOUNTER — Telehealth: Payer: Self-pay | Admitting: Neurology

## 2019-03-24 NOTE — Telephone Encounter (Signed)
Please advise and call

## 2019-03-24 NOTE — Telephone Encounter (Signed)
Patient called and requested the name of the test she had with Dr. Melvyn Novas on 03/22/19.

## 2019-03-30 ENCOUNTER — Other Ambulatory Visit: Payer: Self-pay

## 2019-03-30 ENCOUNTER — Encounter (HOSPITAL_COMMUNITY): Payer: Self-pay

## 2019-03-30 ENCOUNTER — Ambulatory Visit (HOSPITAL_COMMUNITY)
Admission: RE | Admit: 2019-03-30 | Discharge: 2019-03-30 | Disposition: A | Discharge status: Home / Self Care | Payer: Medicare HMO | Source: Ambulatory Visit | Attending: Internal Medicine | Admitting: Internal Medicine

## 2019-03-30 ENCOUNTER — Other Ambulatory Visit (HOSPITAL_COMMUNITY): Payer: Self-pay

## 2019-03-30 VITALS — BP 144/96 | HR 76 | Wt 169.0 lb

## 2019-03-30 DIAGNOSIS — I428 Other cardiomyopathies: Secondary | ICD-10-CM

## 2019-03-30 DIAGNOSIS — F329 Major depressive disorder, single episode, unspecified: Secondary | ICD-10-CM | POA: Insufficient documentation

## 2019-03-30 DIAGNOSIS — E785 Hyperlipidemia, unspecified: Secondary | ICD-10-CM | POA: Diagnosis not present

## 2019-03-30 DIAGNOSIS — Z853 Personal history of malignant neoplasm of breast: Secondary | ICD-10-CM | POA: Insufficient documentation

## 2019-03-30 DIAGNOSIS — N183 Chronic kidney disease, stage 3 unspecified: Secondary | ICD-10-CM | POA: Diagnosis not present

## 2019-03-30 DIAGNOSIS — E1122 Type 2 diabetes mellitus with diabetic chronic kidney disease: Secondary | ICD-10-CM | POA: Insufficient documentation

## 2019-03-30 DIAGNOSIS — Z79899 Other long term (current) drug therapy: Secondary | ICD-10-CM | POA: Insufficient documentation

## 2019-03-30 DIAGNOSIS — M1711 Unilateral primary osteoarthritis, right knee: Secondary | ICD-10-CM | POA: Insufficient documentation

## 2019-03-30 DIAGNOSIS — J45909 Unspecified asthma, uncomplicated: Secondary | ICD-10-CM | POA: Diagnosis not present

## 2019-03-30 DIAGNOSIS — Z923 Personal history of irradiation: Secondary | ICD-10-CM | POA: Insufficient documentation

## 2019-03-30 DIAGNOSIS — Z79890 Hormone replacement therapy (postmenopausal): Secondary | ICD-10-CM | POA: Insufficient documentation

## 2019-03-30 DIAGNOSIS — G932 Benign intracranial hypertension: Secondary | ICD-10-CM | POA: Insufficient documentation

## 2019-03-30 DIAGNOSIS — Z95810 Presence of automatic (implantable) cardiac defibrillator: Secondary | ICD-10-CM

## 2019-03-30 DIAGNOSIS — Z6831 Body mass index (BMI) 31.0-31.9, adult: Secondary | ICD-10-CM | POA: Insufficient documentation

## 2019-03-30 DIAGNOSIS — Z87730 Personal history of (corrected) cleft lip and palate: Secondary | ICD-10-CM | POA: Diagnosis not present

## 2019-03-30 DIAGNOSIS — I878 Other specified disorders of veins: Secondary | ICD-10-CM | POA: Insufficient documentation

## 2019-03-30 DIAGNOSIS — F411 Generalized anxiety disorder: Secondary | ICD-10-CM | POA: Insufficient documentation

## 2019-03-30 DIAGNOSIS — R296 Repeated falls: Secondary | ICD-10-CM

## 2019-03-30 DIAGNOSIS — I5022 Chronic systolic (congestive) heart failure: Secondary | ICD-10-CM | POA: Diagnosis not present

## 2019-03-30 DIAGNOSIS — E039 Hypothyroidism, unspecified: Secondary | ICD-10-CM | POA: Diagnosis not present

## 2019-03-30 DIAGNOSIS — I447 Left bundle-branch block, unspecified: Secondary | ICD-10-CM | POA: Insufficient documentation

## 2019-03-30 DIAGNOSIS — Z9221 Personal history of antineoplastic chemotherapy: Secondary | ICD-10-CM | POA: Diagnosis not present

## 2019-03-30 DIAGNOSIS — I130 Hypertensive heart and chronic kidney disease with heart failure and stage 1 through stage 4 chronic kidney disease, or unspecified chronic kidney disease: Secondary | ICD-10-CM | POA: Insufficient documentation

## 2019-03-30 DIAGNOSIS — E876 Hypokalemia: Secondary | ICD-10-CM | POA: Diagnosis not present

## 2019-03-30 DIAGNOSIS — I951 Orthostatic hypotension: Secondary | ICD-10-CM | POA: Diagnosis not present

## 2019-03-30 LAB — BASIC METABOLIC PANEL
Anion gap: 11 (ref 5–15)
BUN: 24 mg/dL — ABNORMAL HIGH (ref 8–23)
CO2: 24 mmol/L (ref 22–32)
Calcium: 9 mg/dL (ref 8.9–10.3)
Chloride: 104 mmol/L (ref 98–111)
Creatinine, Ser: 1.68 mg/dL — ABNORMAL HIGH (ref 0.44–1.00)
GFR calc Af Amer: 36 mL/min — ABNORMAL LOW (ref >60)
GFR calc non Af Amer: 31 mL/min — ABNORMAL LOW (ref >60)
Glucose, Bld: 90 mg/dL (ref 70–99)
Potassium: 3.6 mmol/L (ref 3.5–5.1)
Sodium: 139 mmol/L (ref 135–145)

## 2019-03-30 LAB — CBC
HCT: 39.7 % (ref 36.0–46.0)
Hemoglobin: 12.8 g/dL (ref 12.0–15.0)
MCH: 33.5 pg (ref 26.0–34.0)
MCHC: 32.2 g/dL (ref 30.0–36.0)
MCV: 103.9 fL — ABNORMAL HIGH (ref 80.0–100.0)
Platelets: 252 10*3/uL (ref 150–400)
RBC: 3.82 MIL/uL — ABNORMAL LOW (ref 3.87–5.11)
RDW: 13.9 % (ref 11.5–15.5)
WBC: 5 10*3/uL (ref 4.0–10.5)
nRBC: 0 % (ref 0.0–0.2)

## 2019-03-30 MED ORDER — TORSEMIDE 20 MG PO TABS: 60.0000 mg | ORAL_TABLET | Freq: Two times a day (BID) | ORAL | 6 refills | Status: DC

## 2019-03-30 MED ORDER — POTASSIUM CHLORIDE CRYS ER 20 MEQ PO TBCR: 20.0000 meq | EXTENDED_RELEASE_TABLET | Freq: Two times a day (BID) | ORAL | 11 refills | Status: DC

## 2019-03-30 NOTE — Patient Instructions (Signed)
Hold TORSEMIDE and POTASSIUM for 2 days Then, DECREASE Torsemide to 60 mg (3 tabs) twice daily DECREASE Potassium to 20 meq (1 tab) twice daily  Labs today We will only contact you if something comes back abnormal or we need to make some changes. Otherwise no news is good news!   Your physician recommends that you schedule a follow-up appointment in: 2-3 months with Dr Haroldine Laws and echo  Your physician has requested that you have an echocardiogram. Echocardiography is a painless test that uses sound waves to create images of your heart. It provides your doctor with information about the size and shape of your heart and how well your heart's chambers and valves are working. This procedure takes approximately one hour. There are no restrictions for this procedure.   Do the following things EVERYDAY: 1) Weigh yourself in the morning before breakfast. Write it down and keep it in a log. 2) Take your medicines as prescribed 3) Eat low salt foods-Limit salt (sodium) to 2000 mg per day.  4) Stay as active as you can everyday 5) Limit all fluids for the day to less than 2 liters  At the [facility], you and your health needs are our priority. As part of our continuing mission to provide you with exceptional heart care, we have created designated Provider Care Teams. These Care Teams include your primary Cardiologist (physician) and Advanced Practice Providers (APPs- Physician Assistants and Nurse Practitioners) who all work together to provide you with the care you need, when you need it.   You may see any of the following providers on your designated Care Team at your next follow up: Marland Kitchen Dr Glori Bickers . Dr Loralie Champagne . Darrick Grinder, NP . Lyda Jester, PA   Please be sure to bring in all your medications bottles to every appointment.

## 2019-03-30 NOTE — Progress Notes (Signed)
ADVANCED HF CLINIC NOTE  Patient ID: Rhonda [name], female   DOB: 04-19-1951, 68 y.o.   MRN: [id number]  Primary Cardiologist: Dr Radford Pax  General Surgeon: Dr Donne Hazel  Orthopedic: Dr Alvan Dame  PCP: Dr Justin Mend Referring MD: Dr Radford Pax  HPI: Rhonda [name] is a 68 y.o. 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 She has had sleep study with no  evidence of sleep apnea.  She has a Medtronic CRT-D device.  Last echo in 7/18 showed recovery of EF to 55-60%.   Yesterday she says had a fall Tuesday x 4 and x 2 Wednesday.  She reports falls are nothing new for her and that she falls all the time.   Today she returns for HF follow up. Overall feeling fair. Says she is frustrated about the neurologic work up. No falls  today.  Denies SOB/PND/Orthopnea. Appetite ok. No fever or chills. Weight at home 170 pounds. Taking all medications. Followed by HF Paramedicine.   Optivol:Impedance down. Activity 1 hour per day. No VT/ AF   10/14/12 ECHO EF 35% RV ok  04/12/13 ECHO EF 20-25%, LV moderately dilated 10/11/2013 ECHO EF 45-50% 7/18 ECHO EF 55-60%, normal RV size and systolic function, PASP 34 mmHg  11/24/12 Creatinine 1.23 Potassium 4.1 12/15/12 Creatinine 2.03 Potassium 3.3  12/22/12 Creatinine 1.2 Potassium 3.8 02/23/13 Creatinine 1.68 K 4.4  05/02/13 K 4.2 Creatinine 1.8  10/11/13 K 3.7 Creatinine 1.69 10/17 K 4.1, creatinine 1.35 09/2017: K3.8 Creatinine 1.57  Review of systems complete and found to be negative unless listed in HPI.   Past Medical History:  Diagnosis Date  . Anemia   . Arthritis    Right knee  . Asthma   . Back pain    Disk problem  . Breast cancer, stage  1 ([place]) 03/26/2011   Left; completed chemotherapy and radiation treatments  . Cardiomyopathy   . Chronic respiratory failure ([place]) 09/14/2013  . Chronic systolic heart failure ([facility])    a) NICM b) ECHO (03/2013) EF 20-25% c) ECHO (09/2013) EF 45-50%, grade I DD  . CKD (chronic kidney disease) stage 3, GFR 30-59 ml/min 11/08/2018  . Complication of anesthesia    History of low blood pressure after surgery; attributed to lying flat  . Diabetes mellitus    "diet controlled" (05/03/2013)  . Exertional shortness of breath   . Generalized anxiety disorder   . GERD (gastroesophageal reflux disease)   . Gout   . Heart murmur   . Hyperlipidemia   . Hypertension   . Hypokalemia 11/08/2018  . Hypothyroidism   . Left bundle branch block    s/p CRT-D (04/2013)  . Major depressive disorder   . Migraines   . On home oxygen therapy    "2L suppose to be q night" (05/03/2013)  . Orthostatic hypotension 07/28/2017  . Peripheral neuropathy    Feet  . SVD (spontaneous vaginal delivery)    x 2  . Unspecified vitamin D deficiency 03/26/2011   Does not take meds    Current Outpatient Medications  Medication Sig Dispense Refill  . acetaZOLAMIDE (DIAMOX) 125 MG tablet TAKE 1 TABLET(125 MG) BY MOUTH DAILY (Patient taking differently: Take 125 mg by mouth  daily. ) 30 tablet 5  . buPROPion (WELLBUTRIN XL) 150 MG 24 hr tablet Take 450 mg by mouth every morning.     . carboxymethylcellulose (REFRESH PLUS) 0.5 % SOLN Place 1 drop into both eyes 3 (three) times daily as needed (dry eyes).    . carvedilol (COREG) 3.125 MG tablet Take 3.125 mg by mouth 2 (two) times daily with a meal.    . citalopram (CELEXA) 20 MG tablet Take 20 mg by mouth daily.    . clonazePAM (KLONOPIN) 0.5 MG tablet Take 0.5 mg by mouth 3 (three) times daily as needed for anxiety.     . fexofenadine (ALLEGRA) 180 MG tablet Take 180 mg by mouth 2 (two) times daily.    Marland Kitchen gabapentin (NEURONTIN) 300 MG capsule Take 300 mg by mouth 3 (three)  times daily.     Marland Kitchen gabapentin (NEURONTIN) 800 MG tablet Take 800 mg by mouth at bedtime as needed (neuropathy).     Marland Kitchen ipratropium (ATROVENT) 0.03 % nasal spray Place 2 sprays into both nostrils 2 (two) times daily.    Marland Kitchen levothyroxine (SYNTHROID) 75 MCG tablet Take 1 tablet (75 mcg total) by mouth daily. 90 tablet 3  . metolazone (ZAROXOLYN) 2.5 MG tablet Take 1 tablet (2.5 mg total) by mouth daily as needed (edema). Take only as directed by CHF clinic    . midodrine (PROAMATINE) 10 MG tablet Take 1 tablet (10 mg total) by mouth 3 (three) times daily with meals. 90 tablet 3  . montelukast (SINGULAIR) 10 MG tablet Take 10 mg by mouth at bedtime.   1  . potassium chloride SA (KLOR-CON) 20 MEQ tablet Take 2 tablets (40 mEq total) by mouth 2 (two) times daily. 180 tablet 11  . simvastatin (ZOCOR) 10 MG tablet Take 10 mg by mouth every evening.     Marland Kitchen spironolactone (ALDACTONE) 25 MG tablet Take 0.5 tablets (12.5 mg total) by mouth every evening. 45 tablet 1  . torsemide (DEMADEX) 20 MG tablet Take 4 tablets (80 mg total) by mouth 2 (two) times daily. 240 tablet 6  . zolpidem (AMBIEN) 10 MG tablet Take 10 mg by mouth at bedtime as needed for sleep. For sleep      No current facility-administered medications for this encounter.     Vitals:   03/30/19 1510  BP: (!) 144/96  Pulse: 76  SpO2: 100%  Weight: 76.7 kg (169 lb)  Sitting 144/96  Standing 102/84   Wt Readings from Last 3 Encounters:  03/30/19 76.7 kg (169 lb)  03/13/19 76.7 kg (169 lb)  03/08/19 77.3 kg (170 lb 6.4 oz)    PHYSICAL EXAM: ASSESSMENT & PLAN:  1) Chronic systolic HF: NICM s/p Medtronic CRT-D, ?Viral myocarditis. Cardiomyopathy dates from 51. EF 45-50% (09/2013), EF up to 55-60% on 20202.  PYP 09/02/17 negative for TTR (Grade 0-1, H/CCL 1.2). SPEP with no M-spike.  -NYHA III. Orthostatic today. She does not appear volume overloaded.  Hold torsemide and potassium x 2 days then start back torsemide 60 mg twice a day + Kdur  20 meq twice a day.  -Continue low dose carvedilol.  - Continue spiro 12.5 mg daily.  -  Continue HF paramedicine.  - Check BMET  2) Obesity -Body mass index is 31.93 kg/m. Discussed portion control. Congratulated on weight loss.  3) HTN Stable.  4) Orthostatic Hypotension.  - Neurology managing. She is also on diamox for increased ICP - Continue midodrine to 10 mg TID.  5) Chronic Venous stasis  6) Falls  Ongoing issue.  7) CKD 3 - Creatinine baseline 1.4-1.6 - check BMET today.    Follow up in a few months. May need RHC to further sort out volume status.  Check BMET    Adlai Sinning NP-C  3:13 PM

## 2019-03-30 NOTE — Progress Notes (Signed)
Paramedicine Encounter   Patient ID: Rhonda [name] , female,   DOB: [birth year],68 y.o.,  MRN: [id number]   Met patient in clinic today with provider Amy.   Mrs. And Mr. Vicars said that she's fallen twice Tuesday and once yesterday.  [name] said that these falls are the same as before, "but they've come back".  It's unknown if she's tripping or if the falls are due to syncope.  She had orthostatic changes during this visit; no potassium or torsemide  fri and sat.  Reduce torsemide to 3 BID; potassium 1 tab BID  She has an appointment to see Dr. [name] next week.      Time spent with patient 40 mins   [place], EMT-Paramedic [phone] 03/30/2019   ACTION: Home visit completed

## 2019-04-03 ENCOUNTER — Telehealth: Payer: Self-pay | Admitting: Neurology

## 2019-04-03 NOTE — Telephone Encounter (Signed)
Just FYI, pt wanted you to know this

## 2019-04-03 NOTE — Telephone Encounter (Signed)
Patient is calling in giving an update before appt Friday. She has had about 6-7 falls in the house; not outside. She has a disability form that she is bringing on Friday. Thanks!

## 2019-04-04 NOTE — Telephone Encounter (Signed)
Patient contacted by Hinton Dyer with information.

## 2019-04-05 ENCOUNTER — Encounter: Payer: Medicare HMO | Admitting: Psychology

## 2019-04-05 ENCOUNTER — Other Ambulatory Visit (HOSPITAL_COMMUNITY): Payer: Self-pay

## 2019-04-05 ENCOUNTER — Other Ambulatory Visit (HOSPITAL_COMMUNITY): Payer: Self-pay | Admitting: *Deleted

## 2019-04-05 DIAGNOSIS — R296 Repeated falls: Secondary | ICD-10-CM | POA: Diagnosis not present

## 2019-04-05 MED ORDER — CARVEDILOL 3.125 MG PO TABS: 3.1250 mg | ORAL_TABLET | Freq: Two times a day (BID) | ORAL | 3 refills | Status: DC

## 2019-04-05 NOTE — Progress Notes (Signed)
Paramedicine Encounter    Patient ID: Rhonda [name], female    DOB: 14-Oct-1950, 68 y.o.   MRN: [id number]    Patient Care Team: Maurice Small, MD as PCP - General (Family Medicine) [name], LCSW as Social Worker (Licensed Clinical Social Worker)  Patient Active Problem List   Diagnosis Date Noted  . Hypokalemia 11/08/2018  . CKD (chronic kidney disease) stage 3, GFR 30-59 ml/min 11/08/2018  . Orthostatic hypotension 07/28/2017  . SVD (spontaneous vaginal delivery)   . Peripheral neuropathy   . On home oxygen therapy   . Migraines   . Left bundle branch block   . Hypothyroidism   . Hypertension   . Hyperlipidemia   . Heart murmur   . GERD (gastroesophageal reflux disease)   . Exertional shortness of breath   . Major depressive disorder   . Back pain   . Arthritis   . Generalized anxiety disorder   . Anemia   . Chronic respiratory failure ([place]) 09/14/2013  . 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 Morbid obesity ([place]) 11/01/2012  . Chronic systolic heart failure ([place]) 10/27/2012  . Endometrial polyp 01/20/2012  . Unspecified vitamin D deficiency 03/26/2011    Current Outpatient Medications:  .  acetaZOLAMIDE (DIAMOX) 125 MG tablet, TAKE 1 TABLET(125 MG) BY MOUTH DAILY (Patient taking differently: Take 125 mg by mouth daily. ), Disp: 30 tablet, Rfl: 5 .  buPROPion (WELLBUTRIN XL) 150 MG 24 hr tablet, Take 450 mg by mouth every morning. , Disp: , Rfl:  .  carboxymethylcellulose (REFRESH PLUS) 0.5 % SOLN, Place 1 drop into both eyes 3 (three) times daily as needed (dry eyes)., Disp: , Rfl:  .  carvedilol (COREG) 3.125 MG tablet, Take 1 tablet (3.125 mg total) by mouth 2 (two) times daily with a meal., Disp: 60 tablet, Rfl: 3 .  citalopram (CELEXA) 20 MG tablet, Take 20 mg by mouth daily., Disp: , Rfl:  .  clonazePAM (KLONOPIN) 0.5 MG tablet, Take 0.5 mg by mouth 3 (three) times daily as needed for anxiety. , Disp: , Rfl:  .   fexofenadine (ALLEGRA) 180 MG tablet, Take 180 mg by mouth 2 (two) times daily., Disp: , Rfl:  .  gabapentin (NEURONTIN) 300 MG capsule, Take 300 mg by mouth 3 (three) times daily. , Disp: , Rfl:  .  gabapentin (NEURONTIN) 800 MG tablet, Take 800 mg by mouth at bedtime as needed (neuropathy). , Disp: , Rfl:  .  ipratropium (ATROVENT) 0.03 % nasal spray, Place 2 sprays into both nostrils 2 (two) times daily., Disp: , Rfl:  .  levothyroxine (SYNTHROID) 75 MCG tablet, Take 1 tablet (75 mcg total) by mouth daily., Disp: 90 tablet, Rfl: 3 .  metolazone (ZAROXOLYN) 2.5 MG tablet, Take 1 tablet (2.5 mg total) by mouth daily as needed (edema). Take only as directed by CHF clinic, Disp: , Rfl:  .  midodrine (PROAMATINE) 10 MG tablet, Take 1 tablet (10 mg total) by mouth 3 (three) times daily with meals., Disp: 90 tablet, Rfl: 3 .  montelukast (SINGULAIR) 10 MG tablet, Take 10 mg by mouth at bedtime. , Disp: , Rfl: 1 .  potassium chloride SA (KLOR-CON) 20 MEQ tablet, Take 1 tablet (20 mEq total) by mouth 2 (two) times daily., Disp: 60 tablet, Rfl: 11 .  simvastatin (ZOCOR) 10 MG tablet, Take 10 mg by mouth every evening. , Disp: , Rfl:  .  spironolactone (ALDACTONE) 25 MG tablet, Take 0.5 tablets (12.5 mg total)  by mouth every evening., Disp: 45 tablet, Rfl: 1 .  torsemide (DEMADEX) 20 MG tablet, Take 3 tablets (60 mg total) by mouth 2 (two) times daily., Disp: 180 tablet, Rfl: 6 .  zolpidem (AMBIEN) 10 MG tablet, Take 10 mg by mouth at bedtime as needed for sleep. For sleep , Disp: , Rfl:  Allergies  Allergen Reactions  . Ceftin Anaphylaxis    Face and throat swell   . Shellfish Allergy Other (See Comments)    Gout exacerbation  . Geodon [Ziprasidone Hcl] Hives  . Lisinopril Other (See Comments)    angioedema  . Allopurinol Nausea Only and Other (See Comments)    weakness  . Ativan [Lorazepam] Itching  . Sulfa Antibiotics Itching  . Ultram [Tramadol Hcl] Itching     Social History    Socioeconomic History  . Marital status: Married    Spouse name: Not on file  . Number of children: 2  . Years of education: 59  . Highest education level: Master's degree (e.g., MA, MS, MEng, MEd, MSW, MBA)  Occupational History  . Not on file  Social Needs  . Financial resource strain: Not on file  . Food insecurity    Worry: Not on file    Inability: Not on file  . Transportation needs    Medical: Not on file    Non-medical: Not on file  Tobacco Use  . Smoking status: Former Smoker    Packs/day: 0.10    Years: 26.00    Pack years: 2.60    Types: Cigarettes    Quit date: 05/06/2007    Years since quitting: 11.9  . Smokeless tobacco: Never Used  Substance and Sexual Activity  . Alcohol use: No  . Drug use: No  . Sexual activity: Yes  Lifestyle  . Physical activity    Days per week: Not on file    Minutes per session: Not on file  . Stress: Not on file  Relationships  . Social Herbalist on phone: Not on file    Gets together: Not on file    Attends religious service: Not on file    Active member of club or organization: Not on file    Attends meetings of clubs or organizations: Not on file    Relationship status: Not on file  . Intimate partner violence    Fear of current or ex partner: Not on file    Emotionally abused: Not on file    Physically abused: Not on file    Forced sexual activity: Not on file  Other Topics Concern  . Not on file  Social History Narrative   Tobacco Use Cigarettes: Former Smoker, Quit in 2008   No Alcohol   No recreational drug use   Diet: Regular/Low Carb   Exercise: None   Occupation: disabled   Education: Research officer, political party, masters   Children: 2   Firearms: No   Therapist, art Use: Always   Former Metallurgist.        Physical Exam Pulmonary:     Effort: Pulmonary effort is normal. No respiratory distress.     Breath sounds: No wheezing or rales.  Abdominal:     General: There is no distension.  Musculoskeletal:         General: No swelling.  Skin:    General: Skin is warm and dry.         Future Appointments  Date Time Provider [place]  04/07/2019 10:50 AM [name],  Donika K, DO [facility] None  04/19/2019  7:30 AM [facility] DEVICE REMOTES [facility]  05/01/2019  7:15 AM [facility] DEVICE REMOTES [facility]  06/07/2019 10:00 AM [facility] ECHO OP 1 [facility]  06/07/2019 11:00 AM Bensimhon, Shaune Pascal, MD [facility] None  06/14/2019 10:00 AM Philemon Kingdom, MD [facility] None  07/19/2019  7:30 AM [facility] DEVICE REMOTES [facility]  10/18/2019  7:30 AM [facility] DEVICE REMOTES [facility]  01/17/2020  7:30 AM [facility] DEVICE REMOTES [facility]  04/17/2020  7:30 AM [facility] DEVICE REMOTES [facility]     BP 118/78 (BP Location: Right Arm, Patient Position: Sitting, Cuff Size: Normal)   Pulse 78   Wt 168 lb 1.6 oz (76.2 kg)   SpO2 97%   BMI 31.76 kg/m   Weight yesterday-168 Last visit weight-169  ATF pt CAO x4 standing in the kitchen talking with her daughter.  She has no complaints; she denies sob, chest pain and dizziness.  She has taken her medications for this past week without missing any. Mrs. Zajicek said that she fell twice two days ago; she denies injuries. She's out of several medications.  rx bottles verified and pill box refilled.    Medication ordered: levothyoxin 77mg    Rhonda [name], EMT Paramedic [phone] 04/05/2019    ACTION: Home visit completed

## 2019-04-07 ENCOUNTER — Other Ambulatory Visit: Payer: Self-pay

## 2019-04-07 ENCOUNTER — Ambulatory Visit (INDEPENDENT_AMBULATORY_CARE_PROVIDER_SITE_OTHER): Payer: Medicare HMO | Admitting: Neurology

## 2019-04-07 ENCOUNTER — Encounter: Payer: Self-pay | Admitting: Neurology

## 2019-04-07 ENCOUNTER — Ambulatory Visit: Payer: Medicare HMO | Admitting: Neurology

## 2019-04-07 VITALS — BP 116/74 | HR 88 | Ht 60.0 in | Wt 175.0 lb

## 2019-04-07 DIAGNOSIS — I951 Orthostatic hypotension: Secondary | ICD-10-CM | POA: Diagnosis not present

## 2019-04-07 DIAGNOSIS — R4189 Other symptoms and signs involving cognitive functions and awareness: Secondary | ICD-10-CM | POA: Diagnosis not present

## 2019-04-07 DIAGNOSIS — G932 Benign intracranial hypertension: Secondary | ICD-10-CM

## 2019-04-07 DIAGNOSIS — E1140 Type 2 diabetes mellitus with diabetic neuropathy, unspecified: Secondary | ICD-10-CM

## 2019-04-07 NOTE — Progress Notes (Signed)
Follow-up Visit   Date: 04/07/19    Rhonda [name] MRN: [id number] DOB: 02-24-1951   Interim History: Rhonda [name] is a 68 y.o.  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  The patient was accompanied to the clinic by husband who also provides collateral information.    History of present illness: She reports having falls since November 2017, some which occurred by tripping over objects and loosing her balance.  Falls become much worse during 2018.  She often gets lightheaded preceding her falls and nearly always falls backwards.  Many of her falls were occurring in the kitchen while standing as she was preparing dinner.  She noticed that prolonged standing for 5 to 10 minutes always triggered her falls and was preceded by lightheadedness.  She was found to be orthostatic and started on midodrine 10 mg 3 times daily.   She has been diabetic for at least 10 years and has painful neuropathy of the feet, which is well-controlled on gabapentin 300mg  BID.  She takes extra gabapentin 800mg  as needed for severe pain, about once per week.   Over the past several years, she has noticed problems with short-term memory and family get frustrated with her because she is "slow".  She is able to cook and do laundry.  She manages her own medications and drives, but sometimes feels uncomfortable when driving. On one occasion, she forgot how to put her cars into her ignition or to start the wipers.  She is driving and has not been involved in any MVAs.  Her family complained that she is repeating herself often and sometimes she talks nonsensically.  She manages finances with her husband.  Prior work-up has included: CT cervical spine, CT brain.  Imaging showed increased  sclerosis of the calvarium and vertebral bodies.  Her PTH was elevated and she is seeing endocrinology.  Findings also showed possible intracranial hypertension and follow-up LP showed mildly raised ICP at 27mm H20.  There has been no change in her gait or falls following the spinal tap.  She was started on Diamox 125mg  daily.  UPDATE 04/07/2019:  She is here for follow-up visit.  She underwent neurocognitive testing, however, due to patient being very tired/sleepy, she was unable to engage in the testing, limiting formal diagnosis. However, there was evidence of severe depression and anxiety, which patient endorses.  She was seeing a counselor with her husband two months ago, but has not continued this. She has expressed some frustration that she always has to do counseling with her husband and the therapist tends to ask her husband if what she is stating is true.  She continues to fall frequently, always when working in the kitchen.  She feels lightheaded/sleepy and quickly passes out.  Sometimes, she is able to break her fall by sitting down.     Medications:  Current Outpatient Medications on File Prior to Visit  Medication Sig Dispense Refill  . acetaZOLAMIDE (DIAMOX) 125 MG tablet TAKE 1 TABLET(125 MG) BY MOUTH DAILY (Patient taking differently: Take 125 mg by mouth daily. ) 30 tablet 5  . buPROPion (WELLBUTRIN XL) 150 MG 24 hr tablet Take 450 mg by mouth every morning.     . carboxymethylcellulose (REFRESH PLUS) 0.5 % SOLN Place 1 drop into both eyes 3 (three) times daily as  needed (dry eyes).    . carvedilol (COREG) 3.125 MG tablet Take 1 tablet (3.125 mg total) by mouth 2 (two) times daily with a meal. 60 tablet 3  . citalopram (CELEXA) 20 MG tablet Take 20 mg by mouth daily.    . clonazePAM (KLONOPIN) 0.5 MG tablet Take 0.5 mg by mouth 3 (three) times daily as needed for anxiety.     . fexofenadine (ALLEGRA) 180 MG tablet Take 180 mg by mouth 2 (two) times daily.    Marland Kitchen gabapentin  (NEURONTIN) 300 MG capsule Take 300 mg by mouth 3 (three) times daily.     Marland Kitchen gabapentin (NEURONTIN) 800 MG tablet Take 800 mg by mouth at bedtime as needed (neuropathy).     Marland Kitchen ipratropium (ATROVENT) 0.03 % nasal spray Place 2 sprays into both nostrils 2 (two) times daily.    Marland Kitchen levothyroxine (SYNTHROID) 75 MCG tablet Take 1 tablet (75 mcg total) by mouth daily. 90 tablet 3  . levothyroxine (SYNTHROID) 88 MCG tablet     . metolazone (ZAROXOLYN) 2.5 MG tablet Take 1 tablet (2.5 mg total) by mouth daily as needed (edema). Take only as directed by CHF clinic    . midodrine (PROAMATINE) 10 MG tablet Take 1 tablet (10 mg total) by mouth 3 (three) times daily with meals. 90 tablet 3  . mirtazapine (REMERON) 30 MG tablet mirtazapine 30 mg tablet    . montelukast (SINGULAIR) 10 MG tablet Take 10 mg by mouth at bedtime.   1  . Nabumetone (RELAFEN PO) nabumetone    . potassium chloride SA (KLOR-CON) 20 MEQ tablet Take 1 tablet (20 mEq total) by mouth 2 (two) times daily. 60 tablet 11  . simvastatin (ZOCOR) 10 MG tablet Take 10 mg by mouth every evening.     Marland Kitchen spironolactone (ALDACTONE) 25 MG tablet Take 0.5 tablets (12.5 mg total) by mouth every evening. 45 tablet 1  . torsemide (DEMADEX) 20 MG tablet Take 3 tablets (60 mg total) by mouth 2 (two) times daily. 180 tablet 6  . zolpidem (AMBIEN) 10 MG tablet Take 10 mg by mouth at bedtime as needed for sleep. For sleep      No current facility-administered medications on file prior to visit.     Allergies:  Allergies  Allergen Reactions  . Ceftin Anaphylaxis    Face and throat swell   . Shellfish Allergy Other (See Comments)    Gout exacerbation  . Geodon [Ziprasidone Hcl] Hives  . Lisinopril Other (See Comments)    angioedema  . Allopurinol Nausea Only and Other (See Comments)    weakness  . Ativan [Lorazepam] Itching  . Sulfa Antibiotics Itching  . Ultram [Tramadol Hcl] Itching    Review of Systems:  CONSTITUTIONAL: No fevers, chills, night  sweats, or weight loss.  EYES: No visual changes or eye pain ENT: No hearing changes.  No history of nose bleeds.   RESPIRATORY: No cough, wheezing and shortness of breath.  +lightheadedness CARDIOVASCULAR: Negative for chest pain, and palpitations.   GI: Negative for abdominal discomfort, blood in stools or black stools.  No recent change in bowel habits.   GU:  No history of incontinence.   MUSCLOSKELETAL: No history of joint pain or swelling.  No myalgias.   SKIN: Negative for lesions, rash, and itching.   ENDOCRINE: Negative for cold or heat intolerance, polydipsia or goiter.   PSYCH:  + depression +anxiety symptoms.   NEURO: As Above.   Vital Signs:  BP 116/74   Pulse  88   Ht 5' (1.524 m)   Wt 175 lb (79.4 kg)   SpO2 98%   BMI 34.18 kg/m   No data found.  General Medical Exam:   General:  Well appearing, comfortable, she is awake and alert Eyes/ENT: see cranial nerve examination.   Neck:   No carotid bruits. Respiratory:  Clear to auscultation, good air entry bilaterally.   Cardiac:  Regular rate and rhythm, no murmur.   Ext:  Marked pitting edema of the legs  Neurological Exam: MENTAL STATUS including orientation to time, place, person, recent and remote memory is attention span and concentration, language, and fund of knowledge is fair.  Follows commands and answers questions easily.  Speech is slow and dysarthric (baseline).  CRANIAL NERVES:  Pupils are round and reactive to light. Extraocular muscles are intact throughout, except mild restriction with upgaze.  No ptosis.  Face is symmetric.   MOTOR:  Motor strength is 5/5 in all extremities. No pronator drift.  Tone is normal.  No tremor.   COORDINATION/GAIT:  Finger to nose testing is intact. There is mild bradykinesia finger tapping.  Heel tapping intact.   Gait is slightly-wide based, unassisted.   Data: Labs 07/31/2016:  Vitamin B1 11, vitamin B12 597, copper 162, SPEP with IFE no M protein  CT head wo contrast  performed at [facility] 11/24/2017: No acute intracranial abnormality appreciated.  Expanded empty sella may reflect idiopathic intracranial hypertension.  Thick dense calvarium which is of uncertain clinical significance.  Boney sclerosis can be seen with, but not limited to, hematological conditions such as myelofibrosis, metabolic bone disorders such as hyperthyroidism and hyperparathyroidism and other causes.  Correlate clinically.   CT cervical spine wo contrast 11/24/2017: 1.  No acute fracture 2.  No significant spinal canal or foraminal stenosis 3.  Increased sclerosis of the bony structures.  Boney sclerosis can be seen with, but not limited to, hematological conditions such as myelofibrosis, metabolic bone disorders such as hyperthyroidism and hyperparathyroidism and other causes.  Correlate clinically.    IMPRESSION/PLAN: 1.  Cognitive impairment, multifactorial.  Formal neurocognitive testing was limited by patient's alertness and engagement.  There was evidence of severe depression and anxiety. I have strongly urged patient to set up individual therapy, as she endorses feeling depressed, but does not get benefit in couple sessions as her husband tends to direct the session.  Plan to repeat testing in 1 year.    2.  Falls due to orthostatic hypotension. Recommend that she avoid standing for prolonged periods of time, as most of her falls have occurred while preparing dinner or cleaning in the kitchen while standing.  She is on midodrine 10mg  TID. I will seek Dr. Clayborne Dana opinion on whether it is safe to add florinef or mestinon.    3.  Intracranial hypertension OP 26cm H20.  Continue Diamox 125 daily  4.  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  Continue gabapentin 300 mg 3 times daily an extra 800 mg for severe pain.  Return to clinic in 4 months   Thank you for allowing me to participate in patient's care.  If I can answer any additional questions, I  would be pleased to do so.    Sincerely,

## 2019-04-07 NOTE — Patient Instructions (Signed)
Try to avoid standing for prolonged periods of time to minimize risk of falls  Return to clinic in 4 months

## 2019-04-10 DIAGNOSIS — I951 Orthostatic hypotension: Secondary | ICD-10-CM | POA: Diagnosis not present

## 2019-04-10 DIAGNOSIS — F330 Major depressive disorder, recurrent, mild: Secondary | ICD-10-CM | POA: Diagnosis not present

## 2019-04-11 NOTE — Progress Notes (Signed)
Donika - she is difficult. Agree -unimpressed with mestinon. I would be ok with florinef 0.1 bid. Would also strongly suggest compression hose if not already using. I am happy to see her back soon. Have we ruled out TTR as a contributing factor? Thanks!

## 2019-04-12 ENCOUNTER — Telehealth: Payer: Self-pay

## 2019-04-12 MED ORDER — FLUDROCORTISONE ACETATE 0.1 MG PO TABS: 0.1000 mg | ORAL_TABLET | Freq: Two times a day (BID) | ORAL | 3 refills | Status: DC

## 2019-04-12 NOTE — Progress Notes (Signed)
Rx for florinef 0.1mg  BID sent to pharmacy.  Stressed importance of wearing compression stockings.  Will request patient follow-up with Dr. Haroldine Laws for heart failure to be sure medication dose is appropriate.

## 2019-04-12 NOTE — Telephone Encounter (Signed)
Close encounter 

## 2019-04-12 NOTE — Telephone Encounter (Signed)
I contacted patient and she is going to get florinef picked up. Advised if she develops shortness of breath to stop meds . She will follow up with Dr.Bensimhon.

## 2019-04-12 NOTE — Addendum Note (Signed)
Addended by: Alda Berthold on: 04/12/2019 12:40 PM   Modules accepted: Orders

## 2019-04-12 NOTE — Telephone Encounter (Signed)
-----   Message from Alda Berthold, DO sent at 04/12/2019 12:40 PM EST ----- Please inform pt that I have discussed her care with her cardiology, Dr. Haroldine Laws and we will start florinef 0.1mg  BID to see if this helps with her passing out spells.  Rx has been sent to pharmacy.  Also stress the importance of wearing compression stockings. Instruct her to follow-up with Dr. Haroldine Laws for heart failure to be sure medication dose is appropriate.  Thanks.

## 2019-04-13 ENCOUNTER — Telehealth (HOSPITAL_COMMUNITY): Payer: Self-pay | Admitting: Cardiology

## 2019-04-13 NOTE — Telephone Encounter (Signed)
Pt called to report her wt is up from 168 lb last week to 174 lb today, she states her breathing is baseline for her but her legs are swollen and "shiny"  She is unsure how much Torsemide she is taking as Advice worker (community paramedic) fixes her pill box for her.  Advised will call Karena Addison and have her go out and see pt tomorrow, pt is agreeable to this.  Called and spoke w/Dee she is aware and states she will go see pt tomorrow and call us once she has assessed her.

## 2019-04-13 NOTE — Telephone Encounter (Signed)
Message left on triage line regarding increase LE edema-tight and shiny Weight stable at 173 Declines increased SOB Compliant with medications  Above concerns addressed Ok to file message

## 2019-04-14 ENCOUNTER — Telehealth (HOSPITAL_COMMUNITY): Payer: Self-pay | Admitting: *Deleted

## 2019-04-14 ENCOUNTER — Other Ambulatory Visit (HOSPITAL_COMMUNITY): Payer: Self-pay

## 2019-04-14 NOTE — Progress Notes (Signed)
Paramedicine Encounter    Patient ID: Rhonda [name], female    DOB: 12-19-1950, 68 y.o.   MRN: [id number]    Patient Care Team: Maurice Small, MD as PCP - General (Family Medicine) [name], LCSW as Social Worker (Licensed Clinical Social Worker) [name], [name], DO as Consulting Physician (Neurology)  Patient Active Problem List   Diagnosis Date Noted  . Hypokalemia 11/08/2018  . CKD (chronic kidney disease) stage 3, GFR 30-59 ml/min 11/08/2018  . Orthostatic hypotension 07/28/2017  . SVD (spontaneous vaginal delivery)   . Peripheral neuropathy   . On home oxygen therapy   . Migraines   . Left bundle branch block   . Hypothyroidism   . Hypertension   . Hyperlipidemia   . Heart murmur   . GERD (gastroesophageal reflux disease)   . Exertional shortness of breath   . Major depressive disorder   . Back pain   . Arthritis   . Generalized anxiety disorder   . Anemia   . Chronic respiratory failure ([place]) 09/14/2013  . 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 Morbid obesity ([place]) 11/01/2012  . Chronic systolic heart failure ([place]) 10/27/2012  . Endometrial polyp 01/20/2012  . Unspecified vitamin D deficiency 03/26/2011    Current Outpatient Medications:  .  acetaZOLAMIDE (DIAMOX) 125 MG tablet, TAKE 1 TABLET(125 MG) BY MOUTH DAILY (Patient taking differently: Take 125 mg by mouth daily. ), Disp: 30 tablet, Rfl: 5 .  carvedilol (COREG) 3.125 MG tablet, Take 1 tablet (3.125 mg total) by mouth 2 (two) times daily with a meal., Disp: 60 tablet, Rfl: 3 .  citalopram (CELEXA) 20 MG tablet, Take 20 mg by mouth daily., Disp: , Rfl:  .  clonazePAM (KLONOPIN) 0.5 MG tablet, Take 0.5 mg by mouth 3 (three) times daily as needed for anxiety. , Disp: , Rfl:  .  fludrocortisone (FLORINEF) 0.1 MG tablet, Take 1 tablet (0.1 mg total) by mouth 2 (two) times daily., Disp: 60 tablet, Rfl: 3 .  gabapentin (NEURONTIN) 300 MG capsule, Take 300 mg by mouth 3  (three) times daily. , Disp: , Rfl:  .  midodrine (PROAMATINE) 10 MG tablet, Take 1 tablet (10 mg total) by mouth 3 (three) times daily with meals., Disp: 90 tablet, Rfl: 3 .  potassium chloride SA (KLOR-CON) 20 MEQ tablet, Take 1 tablet (20 mEq total) by mouth 2 (two) times daily., Disp: 60 tablet, Rfl: 11 .  simvastatin (ZOCOR) 10 MG tablet, Take 10 mg by mouth every evening. , Disp: , Rfl:  .  spironolactone (ALDACTONE) 25 MG tablet, Take 0.5 tablets (12.5 mg total) by mouth every evening., Disp: 45 tablet, Rfl: 1 .  torsemide (DEMADEX) 20 MG tablet, Take 3 tablets (60 mg total) by mouth 2 (two) times daily., Disp: 180 tablet, Rfl: 6 .  buPROPion (WELLBUTRIN XL) 150 MG 24 hr tablet, Take 450 mg by mouth every morning. , Disp: , Rfl:  .  carboxymethylcellulose (REFRESH PLUS) 0.5 % SOLN, Place 1 drop into both eyes 3 (three) times daily as needed (dry eyes)., Disp: , Rfl:  .  fexofenadine (ALLEGRA) 180 MG tablet, Take 180 mg by mouth 2 (two) times daily., Disp: , Rfl:  .  gabapentin (NEURONTIN) 800 MG tablet, Take 800 mg by mouth at bedtime as needed (neuropathy). , Disp: , Rfl:  .  ipratropium (ATROVENT) 0.03 % nasal spray, Place 2 sprays into both nostrils 2 (two) times daily., Disp: , Rfl:  .  levothyroxine (SYNTHROID) 75  MCG tablet, Take 1 tablet (75 mcg total) by mouth daily., Disp: 90 tablet, Rfl: 3 .  levothyroxine (SYNTHROID) 88 MCG tablet, , Disp: , Rfl:  .  metolazone (ZAROXOLYN) 2.5 MG tablet, Take 1 tablet (2.5 mg total) by mouth daily as needed (edema). Take only as directed by CHF clinic, Disp: , Rfl:  .  mirtazapine (REMERON) 30 MG tablet, mirtazapine 30 mg tablet, Disp: , Rfl:  .  montelukast (SINGULAIR) 10 MG tablet, Take 10 mg by mouth at bedtime. , Disp: , Rfl: 1 .  Nabumetone (RELAFEN PO), nabumetone, Disp: , Rfl:  .  zolpidem (AMBIEN) 10 MG tablet, Take 10 mg by mouth at bedtime as needed for sleep. For sleep , Disp: , Rfl:  Allergies  Allergen Reactions  . Ceftin  Anaphylaxis    Face and throat swell   . Shellfish Allergy Other (See Comments)    Gout exacerbation  . Geodon [Ziprasidone Hcl] Hives  . Lisinopril Other (See Comments)    angioedema  . Allopurinol Nausea Only and Other (See Comments)    weakness  . Ativan [Lorazepam] Itching  . Sulfa Antibiotics Itching  . Ultram [Tramadol Hcl] Itching     Social History   Socioeconomic History  . Marital status: Married    Spouse name: Not on file  . Number of children: 2  . Years of education: 39  . Highest education level: Master's degree (e.g., MA, MS, MEng, MEd, MSW, MBA)  Occupational History  . Occupation: retired  Scientific laboratory technician  . Financial resource strain: Not on file  . Food insecurity    Worry: Not on file    Inability: Not on file  . Transportation needs    Medical: Not on file    Non-medical: Not on file  Tobacco Use  . Smoking status: Former Smoker    Packs/day: 0.10    Years: 26.00    Pack years: 2.60    Types: Cigarettes    Quit date: 05/06/2007    Years since quitting: 11.9  . Smokeless tobacco: Never Used  Substance and Sexual Activity  . Alcohol use: No  . Drug use: No  . Sexual activity: Yes  Lifestyle  . Physical activity    Days per week: Not on file    Minutes per session: Not on file  . Stress: Not on file  Relationships  . Social Herbalist on phone: Not on file    Gets together: Not on file    Attends religious service: Not on file    Active member of club or organization: Not on file    Attends meetings of clubs or organizations: Not on file    Relationship status: Not on file  . Intimate partner violence    Fear of current or ex partner: Not on file    Emotionally abused: Not on file    Physically abused: Not on file    Forced sexual activity: Not on file  Other Topics Concern  . Not on file  Social History Narrative   Tobacco Use Cigarettes: Former Smoker, Quit in 2008   No Alcohol   No recreational drug use   Diet:  Regular/Low Carb   Exercise: None   Occupation: disabled   Education: Research officer, political party, masters   Children: 2   Firearms: No   Therapist, art Use: 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       Physical Exam Pulmonary:  Effort: No respiratory distress.     Breath sounds: No wheezing or rales.  Musculoskeletal:     Right lower leg: Edema present.     Left lower leg: Edema present.  Skin:    General: Skin is warm and dry.         Future Appointments  Date Time Provider [place]  04/18/2019 11:00 AM [facility] LAB [facility] None  04/19/2019  7:30 AM [facility] DEVICE REMOTES [facility]  05/01/2019  7:15 AM [facility] DEVICE REMOTES [facility]  06/07/2019 10:00 AM MC ECHO OP 1 [facility]  06/07/2019 11:00 AM Bensimhon, Shaune Pascal, MD [facility] None  06/14/2019 10:00 AM Philemon Kingdom, MD [facility] None  07/19/2019  7:30 AM [facility] DEVICE REMOTES [facility]  08/10/2019  9:50 AM Narda Amber K, DO [facility] None  10/18/2019  7:30 AM [facility] DEVICE REMOTES [facility]  01/17/2020  7:30 AM [facility] DEVICE REMOTES [facility]  04/17/2020  7:30 AM [facility] DEVICE REMOTES [facility]     BP 106/74 (BP Location: Right Arm, Patient Position: Sitting, Cuff Size: Normal)   Pulse 86   Temp 98 F (36.7 C)   Resp 16   Wt 175 lb (79.4 kg)   SpO2 98%   BMI 34.18 kg/m   Weight yesterday-175 Last visit weight-168  ATF pt CAO x4. [facility] and said that her weight has increased.  She denies sob, chest pain and dizziness.  She's taken her medications since our last visit. I verified her weight, called the heart clinic and talked to [facility].  Amy would like her to take a metolazone and 1 extra potassium today.  She took them during our visit.  Rx bottles verified and pill box refilled (2).   Medication  ordered: Midodrine  Corayma Cashatt, EMT Paramedic [phone] 04/14/2019    ACTION: Home visit completed

## 2019-04-14 NOTE — Telephone Encounter (Signed)
Dee w/paramedicine called to report pts weight was up 6lbs w/blee confirmed pt is taking torsemide 60mg  bid. Per [name],NP take metolazone 2.5mg  x once with 58meq of K and get a bmet next week. BMET scheduled for Monday. Dee and pt aware of plan.

## 2019-04-15 ENCOUNTER — Other Ambulatory Visit: Payer: Self-pay | Admitting: Neurology

## 2019-04-18 ENCOUNTER — Other Ambulatory Visit (HOSPITAL_COMMUNITY): Payer: Medicare HMO

## 2019-04-19 ENCOUNTER — Ambulatory Visit (INDEPENDENT_AMBULATORY_CARE_PROVIDER_SITE_OTHER): Payer: Medicare HMO | Admitting: *Deleted

## 2019-04-19 DIAGNOSIS — Z95810 Presence of automatic (implantable) cardiac defibrillator: Secondary | ICD-10-CM

## 2019-04-19 LAB — CUP PACEART REMOTE DEVICE CHECK
Battery Remaining Longevity: 12 mo
Battery Voltage: 2.87 V
Brady Statistic AP VP Percent: 0.64 %
Brady Statistic AP VS Percent: 0.01 %
Brady Statistic AS VP Percent: 99.31 %
Brady Statistic AS VS Percent: 0.04 %
Brady Statistic RA Percent Paced: 0.65 %
Brady Statistic RV Percent Paced: 99.94 %
Date Time Interrogation Session: 20201125033326
HighPow Impedance: 53 Ohm
Implantable Lead Implant Date: 20141210
Implantable Lead Implant Date: 20141210
Implantable Lead Implant Date: 20141210
Implantable Lead Location: 753858
Implantable Lead Location: 753859
Implantable Lead Location: 753860
Implantable Lead Model: 4396
Implantable Lead Model: 5076
Implantable Lead Model: 6935
Implantable Pulse Generator Implant Date: 20141210
Lead Channel Impedance Value: 380 Ohm
Lead Channel Impedance Value: 380 Ohm
Lead Channel Impedance Value: 456 Ohm
Lead Channel Impedance Value: 570 Ohm
Lead Channel Impedance Value: 627 Ohm
Lead Channel Impedance Value: 665 Ohm
Lead Channel Pacing Threshold Amplitude: 0.75 V
Lead Channel Pacing Threshold Amplitude: 1 V
Lead Channel Pacing Threshold Amplitude: 1.125 V
Lead Channel Pacing Threshold Pulse Width: 0.4 ms
Lead Channel Pacing Threshold Pulse Width: 0.4 ms
Lead Channel Pacing Threshold Pulse Width: 0.4 ms
Lead Channel Sensing Intrinsic Amplitude: 22.375 mV
Lead Channel Sensing Intrinsic Amplitude: 22.375 mV
Lead Channel Sensing Intrinsic Amplitude: 3.25 mV
Lead Channel Sensing Intrinsic Amplitude: 3.25 mV
Lead Channel Setting Pacing Amplitude: 2 V
Lead Channel Setting Pacing Amplitude: 2.25 V
Lead Channel Setting Pacing Amplitude: 2.5 V
Lead Channel Setting Pacing Pulse Width: 0.4 ms
Lead Channel Setting Pacing Pulse Width: 0.4 ms
Lead Channel Setting Sensing Sensitivity: 0.3 mV

## 2019-04-27 ENCOUNTER — Other Ambulatory Visit (HOSPITAL_COMMUNITY): Payer: Self-pay

## 2019-04-27 DIAGNOSIS — H04123 Dry eye syndrome of bilateral lacrimal glands: Secondary | ICD-10-CM | POA: Diagnosis not present

## 2019-04-27 DIAGNOSIS — H35363 Drusen (degenerative) of macula, bilateral: Secondary | ICD-10-CM | POA: Diagnosis not present

## 2019-04-27 DIAGNOSIS — E119 Type 2 diabetes mellitus without complications: Secondary | ICD-10-CM | POA: Diagnosis not present

## 2019-04-27 NOTE — Progress Notes (Signed)
Paramedicine Encounter    Patient ID: Rhonda [name], female    DOB: [birth year], 68 y.o.   MRN: [id number]    Patient Care Team: Maurice Small, MD as PCP - General (Family Medicine) [name], LCSW as Social Worker (Licensed Clinical Social Worker) [name], [name], DO as Consulting Physician (Neurology)  Patient Active Problem List   Diagnosis Date Noted  . Hypokalemia 11/08/2018  . CKD (chronic kidney disease) stage 3, GFR 30-59 ml/min 11/08/2018  . Orthostatic hypotension 07/28/2017  . SVD (spontaneous vaginal delivery)   . Peripheral neuropathy   . On home oxygen therapy   . Migraines   . Left bundle branch block   . Hypothyroidism   . Hypertension   . Hyperlipidemia   . Heart murmur   . GERD (gastroesophageal reflux disease)   . Exertional shortness of breath   . Major depressive disorder   . Back pain   . Arthritis   . Generalized anxiety disorder   . Anemia   . Chronic respiratory failure ([place]) 09/14/2013  . 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 Morbid obesity ([place]) 11/01/2012  . Chronic systolic heart failure ([place]) 10/27/2012  . Endometrial polyp 01/20/2012  . Unspecified vitamin D deficiency 03/26/2011    Current Outpatient Medications:  .  acetaZOLAMIDE (DIAMOX) 125 MG tablet, TAKE 1 TABLET(125 MG) BY MOUTH DAILY, Disp: 90 tablet, Rfl: 3 .  buPROPion (WELLBUTRIN XL) 150 MG 24 hr tablet, Take 450 mg by mouth every morning. , Disp: , Rfl:  .  carvedilol (COREG) 3.125 MG tablet, Take 1 tablet (3.125 mg total) by mouth 2 (two) times daily with a meal., Disp: 60 tablet, Rfl: 3 .  citalopram (CELEXA) 20 MG tablet, Take 20 mg by mouth daily., Disp: , Rfl:  .  clonazePAM (KLONOPIN) 0.5 MG tablet, Take 0.5 mg by mouth 3 (three) times daily as needed for anxiety. , Disp: , Rfl:  .  fludrocortisone (FLORINEF) 0.1 MG tablet, Take 1 tablet (0.1 mg total) by mouth 2 (two) times daily., Disp: 60 tablet, Rfl: 3 .  gabapentin  (NEURONTIN) 300 MG capsule, Take 300 mg by mouth 3 (three) times daily. , Disp: , Rfl:  .  gabapentin (NEURONTIN) 800 MG tablet, Take 800 mg by mouth at bedtime as needed (neuropathy). , Disp: , Rfl:  .  levothyroxine (SYNTHROID) 75 MCG tablet, Take 1 tablet (75 mcg total) by mouth daily., Disp: 90 tablet, Rfl: 3 .  metolazone (ZAROXOLYN) 2.5 MG tablet, Take 1 tablet (2.5 mg total) by mouth daily as needed (edema). Take only as directed by CHF clinic, Disp: , Rfl:  .  potassium chloride SA (KLOR-CON) 20 MEQ tablet, Take 1 tablet (20 mEq total) by mouth 2 (two) times daily., Disp: 60 tablet, Rfl: 11 .  simvastatin (ZOCOR) 10 MG tablet, Take 10 mg by mouth every evening. , Disp: , Rfl:  .  spironolactone (ALDACTONE) 25 MG tablet, Take 0.5 tablets (12.5 mg total) by mouth every evening., Disp: 45 tablet, Rfl: 1 .  torsemide (DEMADEX) 20 MG tablet, Take 3 tablets (60 mg total) by mouth 2 (two) times daily., Disp: 180 tablet, Rfl: 6 .  carboxymethylcellulose (REFRESH PLUS) 0.5 % SOLN, Place 1 drop into both eyes 3 (three) times daily as needed (dry eyes)., Disp: , Rfl:  .  fexofenadine (ALLEGRA) 180 MG tablet, Take 180 mg by mouth 2 (two) times daily., Disp: , Rfl:  .  ipratropium (ATROVENT) 0.03 % nasal spray, Place 2 sprays into  both nostrils 2 (two) times daily., Disp: , Rfl:  .  levothyroxine (SYNTHROID) 88 MCG tablet, , Disp: , Rfl:  .  midodrine (PROAMATINE) 10 MG tablet, Take 1 tablet (10 mg total) by mouth 3 (three) times daily with meals., Disp: 90 tablet, Rfl: 3 .  mirtazapine (REMERON) 30 MG tablet, mirtazapine 30 mg tablet, Disp: , Rfl:  .  montelukast (SINGULAIR) 10 MG tablet, Take 10 mg by mouth at bedtime. , Disp: , Rfl: 1 .  Nabumetone (RELAFEN PO), nabumetone, Disp: , Rfl:  .  zolpidem (AMBIEN) 10 MG tablet, Take 10 mg by mouth at bedtime as needed for sleep. For sleep , Disp: , Rfl:  Allergies  Allergen Reactions  . Ceftin Anaphylaxis    Face and throat swell   . Shellfish Allergy  Other (See Comments)    Gout exacerbation  . Geodon [Ziprasidone Hcl] Hives  . Lisinopril Other (See Comments)    angioedema  . Allopurinol Nausea Only and Other (See Comments)    weakness  . Ativan [Lorazepam] Itching  . Sulfa Antibiotics Itching  . Ultram [Tramadol Hcl] Itching     Social History   Socioeconomic History  . Marital status: Married    Spouse name: Not on file  . Number of children: 2  . Years of education: 44  . Highest education level: Master's degree (e.g., MA, MS, MEng, MEd, MSW, MBA)  Occupational History  . Occupation: retired  Scientific laboratory technician  . Financial resource strain: Not on file  . Food insecurity    Worry: Not on file    Inability: Not on file  . Transportation needs    Medical: Not on file    Non-medical: Not on file  Tobacco Use  . Smoking status: Former Smoker    Packs/day: 0.10    Years: 26.00    Pack years: 2.60    Types: Cigarettes    Quit date: 05/06/2007    Years since quitting: 11.9  . Smokeless tobacco: Never Used  Substance and Sexual Activity  . Alcohol use: No  . Drug use: No  . Sexual activity: Yes  Lifestyle  . Physical activity    Days per week: Not on file    Minutes per session: Not on file  . Stress: Not on file  Relationships  . Social Herbalist on phone: Not on file    Gets together: Not on file    Attends religious service: Not on file    Active member of club or organization: Not on file    Attends meetings of clubs or organizations: Not on file    Relationship status: Not on file  . Intimate partner violence    Fear of current or ex partner: Not on file    Emotionally abused: Not on file    Physically abused: Not on file    Forced sexual activity: Not on file  Other Topics Concern  . Not on file  Social History Narrative   Tobacco Use Cigarettes: Former Smoker, Quit in 2008   No Alcohol   No recreational drug use   Diet: Regular/Low Carb   Exercise: None   Occupation: disabled    Education: Research officer, political party, masters   Children: 2   Firearms: No   Therapist, art Use: 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       Physical Exam Pulmonary:     Effort: Pulmonary effort is normal. No  respiratory distress.     Breath sounds: No wheezing.  Skin:    General: Skin is warm and dry.         Future Appointments  Date Time Provider [place]  05/01/2019  7:15 AM [facility] DEVICE REMOTES [facility]  06/07/2019 10:00 AM MC ECHO OP 1 [facility]  06/07/2019 11:00 AM Bensimhon, Shaune Pascal, MD [facility] None  06/14/2019 10:00 AM Philemon Kingdom, MD [facility] None  07/19/2019  7:30 AM [facility] DEVICE REMOTES [facility]  08/10/2019  9:50 AM Narda Amber K, DO [facility] None  10/18/2019  7:30 AM [facility] DEVICE REMOTES [facility]  01/17/2020  7:30 AM [facility] DEVICE REMOTES [facility]  04/17/2020  7:30 AM [facility] DEVICE REMOTES [facility]     BP 100/60 (BP Location: Right Arm, Patient Position: Sitting, Cuff Size: Large)   Pulse 87   Wt 170 lb (77.1 kg)   SpO2 98%   BMI 33.20 kg/m   Weight yesterday-169 Last visit weight-175 (11/20)  ATF pt CAO to her norm walking around in the kitchen.  She's already taken her medications for today.  She said that she's still having "spells of dizziness" but she hasn't fallen this week.  She denies sob, chest pain and dizziness today. Rx bottles verified and pill box (2) refilled. She's out of midodrine; she said that her husband will pick up her meds and she'll call me when he does.      Medication ordered: Clonazepam Midodrine  Rhonda [name], EMT Paramedic [phone] 04/28/2019    ACTION: Home visit completed

## 2019-05-01 ENCOUNTER — Ambulatory Visit (INDEPENDENT_AMBULATORY_CARE_PROVIDER_SITE_OTHER): Payer: Medicare HMO

## 2019-05-01 DIAGNOSIS — Z95810 Presence of automatic (implantable) cardiac defibrillator: Secondary | ICD-10-CM

## 2019-05-01 DIAGNOSIS — I5022 Chronic systolic (congestive) heart failure: Secondary | ICD-10-CM | POA: Diagnosis not present

## 2019-05-02 ENCOUNTER — Telehealth: Payer: Self-pay | Admitting: Internal Medicine

## 2019-05-02 NOTE — Telephone Encounter (Signed)
Patient called to advise that she is absolutely freezing even when wearing extra clothing and using extra covers at night. She is still having syncopal episodes.   Call back number is [phone]

## 2019-05-02 NOTE — Progress Notes (Signed)
EPIC Encounter for ICM Monitoring  Patient Name: Rhonda [name] is a 68 y.o. female Date: 05/02/2019 Primary Care Physican: Maurice Small, MD Primary Cardiologist:Bensimhon Electrophysiologist: Santina Evans Pacing: 100% 05/02/2019 Weight: 169.1lbs   Spoke with patient.  She has slight swelling in her feet.  Patient was not fluid overloaded per 11/5 office note which does not seem to correlate with Optivol.  Optivol thoracic impedancecontinues to suggestpossibleongoingfluid accumulation since February.    Prescribed:Torsemide 20 mg3tablets (60 mg total)by mouthtwice a day.Potassium 20 meq1tablets (20 mEq total)twice a day. Metolazone 2.5mg  1 tablet by mouth if needed for fluid or edema.  Labs: 03/30/2019 Creatinine 1.68, BUN 24, Potassium 3.6, Sodium 139, GFR 31-36 01/11/2019 Creatinine 1.84, BUN 35, Potassium 4.2, Sodium 140, GFR 28-33 11/10/2018 Creatinine1.21, BUN25, Potassium3.9, Sodium138, [id number] 11/09/2018 Creatinine1.53, BUN22, Potassium2.9, [id number], [id number]  11/08/2018 Creatinine1.75, BUN25, Potassium<2.0, Sodium141, [id number]  10/27/2018 Creatinine1.44, BUN15, Potassium5.2, [id number], [id number] A complete set of results can be found in Results Review.  Recommendations: Reinforced limiting salt intake to < 2000 mg daily and fluid intake to 64 oz daily.  Encouraged to call if experiencing fluid symptoms.  Follow-up plan: ICM clinic phone appointment on 06/05/2019.   91 day device clinic remote transmission 07/19/2019.  Office appt 06/07/2019 with Dr. Haroldine Laws.    Copy of ICM check sent to Dr. Lovena Le.   3 month ICM trend: 05/01/2019    1 Year ICM trend:       [name], RN 05/02/2019 9:52 AM

## 2019-05-02 NOTE — Telephone Encounter (Signed)
She is due for thyroid labs.  Can we schedule this? If she is hypothyroid, then that the cold intolerance can be due to this.  If not, it is possibly related to something

## 2019-05-03 ENCOUNTER — Other Ambulatory Visit: Payer: Self-pay

## 2019-05-03 DIAGNOSIS — Z20822 Contact with and (suspected) exposure to covid-19: Secondary | ICD-10-CM

## 2019-05-03 NOTE — Telephone Encounter (Signed)
Notified patient of message from Dr. Cruzita Lederer, patient expressed understanding and agreement. No further questions.  Lab appointment set up for Friday.

## 2019-05-04 ENCOUNTER — Other Ambulatory Visit (HOSPITAL_COMMUNITY): Payer: Self-pay | Admitting: Student

## 2019-05-05 ENCOUNTER — Telehealth: Payer: Self-pay

## 2019-05-05 ENCOUNTER — Other Ambulatory Visit: Payer: Medicare HMO

## 2019-05-05 LAB — NOVEL CORONAVIRUS, NAA: SARS-CoV-2, NAA: NOT DETECTED

## 2019-05-05 NOTE — Telephone Encounter (Signed)
Caller given negative result and verbalized understanding  

## 2019-05-10 DIAGNOSIS — F0632 Mood disorder due to known physiological condition with major depressive-like episode: Secondary | ICD-10-CM | POA: Diagnosis not present

## 2019-05-12 ENCOUNTER — Other Ambulatory Visit (HOSPITAL_COMMUNITY): Payer: Self-pay

## 2019-05-12 NOTE — Progress Notes (Signed)
Paramedicine Encounter    Patient ID: Rhonda [name], female    DOB: July 07, 1950, 68 y.o.   MRN: [id number]    Patient Care Team: Maurice Small, MD as PCP - General (Family Medicine) [name], LCSW as Social Worker (Licensed Clinical Social Worker) [name], [name], DO as Consulting Physician (Neurology)  Patient Active Problem List   Diagnosis Date Noted  . Hypokalemia 11/08/2018  . CKD (chronic kidney disease) stage 3, GFR 30-59 ml/min 11/08/2018  . Orthostatic hypotension 07/28/2017  . SVD (spontaneous vaginal delivery)   . Peripheral neuropathy   . On home oxygen therapy   . Migraines   . Left bundle branch block   . Hypothyroidism   . Hypertension   . Hyperlipidemia   . Heart murmur   . GERD (gastroesophageal reflux disease)   . Exertional shortness of breath   . Major depressive disorder   . Back pain   . Arthritis   . Generalized anxiety disorder   . Anemia   . Chronic respiratory failure ([place]) 09/14/2013  . 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 Morbid obesity ([place]) 11/01/2012  . Chronic systolic heart failure ([place]) 10/27/2012  . Endometrial polyp 01/20/2012  . Unspecified vitamin D deficiency 03/26/2011    Current Outpatient Medications:  .  acetaZOLAMIDE (DIAMOX) 125 MG tablet, TAKE 1 TABLET(125 MG) BY MOUTH DAILY, Disp: 90 tablet, Rfl: 3 .  carvedilol (COREG) 3.125 MG tablet, Take 1 tablet (3.125 mg total) by mouth 2 (two) times daily with a meal., Disp: 60 tablet, Rfl: 3 .  citalopram (CELEXA) 20 MG tablet, Take 20 mg by mouth daily., Disp: , Rfl:  .  gabapentin (NEURONTIN) 300 MG capsule, Take 300 mg by mouth 3 (three) times daily. , Disp: , Rfl:  .  levothyroxine (SYNTHROID) 75 MCG tablet, Take 1 tablet (75 mcg total) by mouth daily., Disp: 90 tablet, Rfl: 3 .  midodrine (PROAMATINE) 10 MG tablet, Take 1 tablet (10 mg total) by mouth 3 (three) times daily with meals., Disp: 90 tablet, Rfl: 3 .  potassium chloride  SA (KLOR-CON) 20 MEQ tablet, Take 1 tablet (20 mEq total) by mouth 2 (two) times daily., Disp: 60 tablet, Rfl: 11 .  simvastatin (ZOCOR) 10 MG tablet, Take 10 mg by mouth every evening. , Disp: , Rfl:  .  spironolactone (ALDACTONE) 25 MG tablet, TAKE 1/2 TABLET BY MOUTH EVERY EVENING, Disp: 45 tablet, Rfl: 1 .  torsemide (DEMADEX) 20 MG tablet, Take 3 tablets (60 mg total) by mouth 2 (two) times daily., Disp: 180 tablet, Rfl: 6 .  buPROPion (WELLBUTRIN XL) 150 MG 24 hr tablet, Take 450 mg by mouth every morning. , Disp: , Rfl:  .  carboxymethylcellulose (REFRESH PLUS) 0.5 % SOLN, Place 1 drop into both eyes 3 (three) times daily as needed (dry eyes)., Disp: , Rfl:  .  clonazePAM (KLONOPIN) 0.5 MG tablet, Take 0.5 mg by mouth 3 (three) times daily as needed for anxiety. , Disp: , Rfl:  .  fexofenadine (ALLEGRA) 180 MG tablet, Take 180 mg by mouth 2 (two) times daily., Disp: , Rfl:  .  fludrocortisone (FLORINEF) 0.1 MG tablet, Take 1 tablet (0.1 mg total) by mouth 2 (two) times daily., Disp: 60 tablet, Rfl: 3 .  gabapentin (NEURONTIN) 800 MG tablet, Take 800 mg by mouth at bedtime as needed (neuropathy). , Disp: , Rfl:  .  ipratropium (ATROVENT) 0.03 % nasal spray, Place 2 sprays into both nostrils 2 (two) times daily., Disp: ,  Rfl:  .  levothyroxine (SYNTHROID) 88 MCG tablet, , Disp: , Rfl:  .  metolazone (ZAROXOLYN) 2.5 MG tablet, Take 1 tablet (2.5 mg total) by mouth daily as needed (edema). Take only as directed by CHF clinic, Disp: , Rfl:  .  mirtazapine (REMERON) 30 MG tablet, mirtazapine 30 mg tablet, Disp: , Rfl:  .  montelukast (SINGULAIR) 10 MG tablet, Take 10 mg by mouth at bedtime. , Disp: , Rfl: 1 .  Nabumetone (RELAFEN PO), nabumetone, Disp: , Rfl:  .  zolpidem (AMBIEN) 10 MG tablet, Take 10 mg by mouth at bedtime as needed for sleep. For sleep , Disp: , Rfl:  Allergies  Allergen Reactions  . Ceftin Anaphylaxis    Face and throat swell   . Shellfish Allergy Other (See Comments)     Gout exacerbation  . Geodon [Ziprasidone Hcl] Hives  . Lisinopril Other (See Comments)    angioedema  . Allopurinol Nausea Only and Other (See Comments)    weakness  . Ativan [Lorazepam] Itching  . Sulfa Antibiotics Itching  . Ultram [Tramadol Hcl] Itching     Social History   Socioeconomic History  . Marital status: Married    Spouse name: Not on file  . Number of children: 2  . Years of education: 63  . Highest education level: Master's degree (e.g., MA, MS, MEng, MEd, MSW, MBA)  Occupational History  . Occupation: retired  Tobacco Use  . Smoking status: Former Smoker    Packs/day: 0.10    Years: 26.00    Pack years: 2.60    Types: Cigarettes    Quit date: 05/06/2007    Years since quitting: 12.0  . Smokeless tobacco: Never Used  Substance and Sexual Activity  . Alcohol use: No  . Drug use: No  . Sexual activity: Yes  Other Topics Concern  . 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 Difficulty of Paying Living Expenses: Not on file  Food Insecurity:   . Worried About Charity fundraiser in the Last Year: Not on file  . Ran Out of Food in the Last Year: Not on file  Transportation Needs:   . Lack of Transportation (Medical): Not on file  . Lack of Transportation (Non-Medical): Not on file  Physical Activity:   . Days of Exercise per Week: Not on file  . Minutes of Exercise per Session: Not on file  Stress:   . Feeling of Stress : Not on file  Social Connections:   . Frequency of Communication with Friends and Family: Not on file  . Frequency of Social Gatherings with Friends and Family:  Not on file  . Attends Religious Services: Not on file  . Active Member of Clubs or Organizations: Not on file  . Attends Archivist Meetings: Not on file  . Marital Status: Not on file  Intimate Partner Violence:   . Fear of Current or Ex-Partner: Not on file  . Emotionally Abused: Not on file  . Physically Abused:  Not on file  . Sexually Abused: Not on file    Physical Exam      Future Appointments  Date Time Provider [place]  06/05/2019  8:25 AM [facility] DEVICE REMOTES [facility]  06/07/2019 10:00 AM [facility] ECHO OP 1 [facility]  06/07/2019 11:00 AM Bensimhon, Shaune Pascal, MD [facility] None  06/14/2019 10:00 AM Philemon Kingdom, MD [facility] None  07/19/2019  7:30 AM [facility] DEVICE REMOTES [facility]  08/10/2019  9:50 AM Narda Amber K, DO [facility] None  10/18/2019  7:30 AM [facility] DEVICE REMOTES [facility]  01/17/2020  7:30 AM [facility] DEVICE REMOTES [facility]  04/17/2020  7:30 AM [facility] DEVICE REMOTES [facility]     BP 110/72 (BP Location: Right Arm, Patient Position: Sitting, Cuff Size: Normal)   Pulse 73   Temp 98.6 F (37 C)   SpO2 96%   Weight yesterday-didn't weigh   ATF pt CAO to her norm sitting in the living room watching tv. She said that she's been very depressed lately. She said that she's cried a few times this week.  Her psychiatrist added a new medication a few weeks ago.  I asked her if she's contacted her psychiatrist about her symptoms.  She hasn't expressed any thoughts of harming herself or anyone else.  She denies sob, chest pain and dizziness. rx bottles verified and pill box (2) refilled.   She has forgotten to write down several weights or just haven't felt like weighing. We discussed the importance of her weighing.    Medication ordered: none Estefano Victory, EMT Paramedic [phone] 05/14/2019    ACTION: Home visit  completed

## 2019-05-16 NOTE — Progress Notes (Signed)
ICD remote 

## 2019-05-24 DIAGNOSIS — E1140 Type 2 diabetes mellitus with diabetic neuropathy, unspecified: Secondary | ICD-10-CM | POA: Diagnosis not present

## 2019-05-24 DIAGNOSIS — F418 Other specified anxiety disorders: Secondary | ICD-10-CM | POA: Diagnosis not present

## 2019-05-24 DIAGNOSIS — R2681 Unsteadiness on feet: Secondary | ICD-10-CM | POA: Diagnosis not present

## 2019-05-24 DIAGNOSIS — R296 Repeated falls: Secondary | ICD-10-CM | POA: Diagnosis not present

## 2019-05-24 DIAGNOSIS — I5022 Chronic systolic (congestive) heart failure: Secondary | ICD-10-CM | POA: Diagnosis not present

## 2019-05-24 DIAGNOSIS — N183 Chronic kidney disease, stage 3 unspecified: Secondary | ICD-10-CM | POA: Diagnosis not present

## 2019-05-24 DIAGNOSIS — I951 Orthostatic hypotension: Secondary | ICD-10-CM | POA: Diagnosis not present

## 2019-05-24 DIAGNOSIS — G4719 Other hypersomnia: Secondary | ICD-10-CM | POA: Diagnosis not present

## 2019-05-24 DIAGNOSIS — G932 Benign intracranial hypertension: Secondary | ICD-10-CM | POA: Diagnosis not present

## 2019-05-25 ENCOUNTER — Other Ambulatory Visit (HOSPITAL_COMMUNITY): Payer: Self-pay

## 2019-05-25 NOTE — Progress Notes (Signed)
Paramedicine Encounter    Patient ID: Rhonda [name], female    DOB: [birth year], 68 y.o.   MRN: [id number]    Patient Care Team: Maurice Small, MD as PCP - General (Family Medicine) [name], LCSW as Social Worker (Licensed Clinical Social Worker) [name], [name], DO as Consulting Physician (Neurology)  Patient Active Problem List   Diagnosis Date Noted  . Hypokalemia 11/08/2018  . CKD (chronic kidney disease) stage 3, GFR 30-59 ml/min 11/08/2018  . Orthostatic hypotension 07/28/2017  . SVD (spontaneous vaginal delivery)   . Peripheral neuropathy   . On home oxygen therapy   . Migraines   . Left bundle branch block   . Hypothyroidism   . Hypertension   . Hyperlipidemia   . Heart murmur   . GERD (gastroesophageal reflux disease)   . Exertional shortness of breath   . Major depressive disorder   . Back pain   . Arthritis   . Generalized anxiety disorder   . Anemia   . Chronic respiratory failure ([place]) 09/14/2013  . 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 Morbid obesity ([place]) 11/01/2012  . Chronic systolic heart failure ([place]) 10/27/2012  . Endometrial polyp 01/20/2012  . Unspecified vitamin D deficiency 03/26/2011    Current Outpatient Medications:  .  acetaZOLAMIDE (DIAMOX) 125 MG tablet, TAKE 1 TABLET(125 MG) BY MOUTH DAILY, Disp: 90 tablet, Rfl: 3 .  buPROPion (WELLBUTRIN XL) 150 MG 24 hr tablet, Take 450 mg by mouth every morning. , Disp: , Rfl:  .  carvedilol (COREG) 3.125 MG tablet, Take 1 tablet (3.125 mg total) by mouth 2 (two) times daily with a meal., Disp: 60 tablet, Rfl: 3 .  citalopram (CELEXA) 20 MG tablet, Take 20 mg by mouth daily., Disp: , Rfl:  .  clonazePAM (KLONOPIN) 0.5 MG tablet, Take 0.5 mg by mouth 3 (three) times daily as needed for anxiety. , Disp: , Rfl:  .  fludrocortisone (FLORINEF) 0.1 MG tablet, Take 1 tablet (0.1 mg total) by mouth 2 (two) times daily., Disp: 60 tablet, Rfl: 3 .  gabapentin  (NEURONTIN) 300 MG capsule, Take 300 mg by mouth 3 (three) times daily. , Disp: , Rfl:  .  gabapentin (NEURONTIN) 800 MG tablet, Take 800 mg by mouth at bedtime as needed (neuropathy). , Disp: , Rfl:  .  levothyroxine (SYNTHROID) 75 MCG tablet, Take 1 tablet (75 mcg total) by mouth daily., Disp: 90 tablet, Rfl: 3 .  midodrine (PROAMATINE) 10 MG tablet, Take 1 tablet (10 mg total) by mouth 3 (three) times daily with meals., Disp: 90 tablet, Rfl: 3 .  mirtazapine (REMERON) 30 MG tablet, mirtazapine 30 mg tablet, Disp: , Rfl:  .  montelukast (SINGULAIR) 10 MG tablet, Take 10 mg by mouth at bedtime. , Disp: , Rfl: 1 .  potassium chloride SA (KLOR-CON) 20 MEQ tablet, Take 1 tablet (20 mEq total) by mouth 2 (two) times daily., Disp: 60 tablet, Rfl: 11 .  simvastatin (ZOCOR) 10 MG tablet, Take 10 mg by mouth every evening. , Disp: , Rfl:  .  spironolactone (ALDACTONE) 25 MG tablet, TAKE 1/2 TABLET BY MOUTH EVERY EVENING, Disp: 45 tablet, Rfl: 1 .  torsemide (DEMADEX) 20 MG tablet, Take 3 tablets (60 mg total) by mouth 2 (two) times daily., Disp: 180 tablet, Rfl: 6 .  carboxymethylcellulose (REFRESH PLUS) 0.5 % SOLN, Place 1 drop into both eyes 3 (three) times daily as needed (dry eyes)., Disp: , Rfl:  .  fexofenadine (ALLEGRA) 180  MG tablet, Take 180 mg by mouth 2 (two) times daily., Disp: , Rfl:  .  ipratropium (ATROVENT) 0.03 % nasal spray, Place 2 sprays into both nostrils 2 (two) times daily., Disp: , Rfl:  .  levothyroxine (SYNTHROID) 88 MCG tablet, , Disp: , Rfl:  .  metolazone (ZAROXOLYN) 2.5 MG tablet, Take 1 tablet (2.5 mg total) by mouth daily as needed (edema). Take only as directed by CHF clinic, Disp: , Rfl:  .  Nabumetone (RELAFEN PO), nabumetone, Disp: , Rfl:  .  zolpidem (AMBIEN) 10 MG tablet, Take 10 mg by mouth at bedtime as needed for sleep. For sleep , Disp: , Rfl:  Allergies  Allergen Reactions  . Ceftin Anaphylaxis    Face and throat swell   . Shellfish Allergy Other (See  Comments)    Gout exacerbation  . Geodon [Ziprasidone Hcl] Hives  . Lisinopril Other (See Comments)    angioedema  . Allopurinol Nausea Only and Other (See Comments)    weakness  . Ativan [Lorazepam] Itching  . Sulfa Antibiotics Itching  . Ultram [Tramadol Hcl] Itching     Social History   Socioeconomic History  . Marital status: Married    Spouse name: Not on file  . Number of children: 2  . Years of education: 78  . Highest education level: Master's degree (e.g., MA, MS, MEng, MEd, MSW, MBA)  Occupational History  . Occupation: retired  Tobacco Use  . Smoking status: Former Smoker    Packs/day: 0.10    Years: 26.00    Pack years: 2.60    Types: Cigarettes    Quit date: 05/06/2007    Years since quitting: 12.0  . Smokeless tobacco: Never Used  Substance and Sexual Activity  . Alcohol use: No  . Drug use: No  . Sexual activity: Yes  Other Topics Concern  . 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 Difficulty of Paying Living Expenses: Not on file  Food Insecurity:   . Worried About Charity fundraiser in the Last Year: Not on file  . Ran Out of Food in the Last Year: Not on file  Transportation Needs:   . Lack of Transportation (Medical): Not on file  . Lack of Transportation (Non-Medical): Not on file  Physical Activity:   . Days of Exercise per Week: Not on file  . Minutes of Exercise per Session: Not on file  Stress:   . Feeling of Stress : Not on file  Social Connections:   . Frequency of Communication with Friends and Family: Not on file  . Frequency of Social Gatherings with  Friends and Family: Not on file  . Attends Religious Services: Not on file  . Active Member of Clubs or Organizations: Not on file  . Attends Archivist Meetings: Not on file  . Marital Status: Not on file  Intimate Partner Violence:   . Fear of Current or Ex-Partner: Not on file  . Emotionally Abused: Not on file  . Physically  Abused: Not on file  . Sexually Abused: Not on file    Physical Exam Pulmonary:     Effort: No respiratory distress.     Breath sounds: No wheezing or rales.  Abdominal:     General: There is no distension.  Musculoskeletal:     Right lower leg: Edema present.     Left lower leg: Edema present.     Comments: +2 edema noted to pt's feet and ankles  Skin:    General: Skin is warm and dry.         Future Appointments  Date Time Provider [place]  06/05/2019  8:25 AM [facility] DEVICE REMOTES [facility]  06/07/2019 10:00 AM [facility] ECHO OP 1 [facility]  06/07/2019 11:00 AM Bensimhon, Shaune Pascal, MD [facility] None  06/14/2019 10:00 AM Philemon Kingdom, MD [facility] None  07/19/2019  7:30 AM [facility] DEVICE REMOTES [facility]  08/10/2019  9:50 AM Narda Amber K, DO [facility] None  10/18/2019  7:30 AM [facility] DEVICE REMOTES [facility]  01/17/2020  7:30 AM [facility] DEVICE REMOTES [facility]  04/17/2020  7:30 AM [facility] DEVICE REMOTES [facility]     BP 98/60 (BP Location: Left Arm, Patient Position: Sitting, Cuff Size: Large)   Pulse 81   Temp 98.9 F (37.2 C)   Wt 168 lb (76.2 kg)   BMI 32.81 kg/m   Weight yesterday-she can't remember her results from yesterday Last visit weight-170 (04/27/19)  ATF pt CAO x4 standing in the kitchen cooking. Her daughter is present during this visit and she's monitoring her mom cooking. Mrs. Seres has a hx of falling asleep while cooking.  Pt had an appointment yesterday for consultation for a sleep study.  Pt's physician  would like to monitor her for narcolepsy.  She denies sob, chest pain and dizziness.  She hasn't fallen since our last visit.    Pt has a prescription for duloxetine (cymbalta) 30 mg daily for 30 days (Dr. Casimiro Needle)  Medication ordered: none  Burnard Enis, EMT Paramedic [phone] 05/25/2019    ACTION: Home visit completed

## 2019-06-03 ENCOUNTER — Other Ambulatory Visit: Payer: Self-pay | Admitting: Internal Medicine

## 2019-06-05 ENCOUNTER — Ambulatory Visit (INDEPENDENT_AMBULATORY_CARE_PROVIDER_SITE_OTHER): Payer: Medicare HMO

## 2019-06-05 DIAGNOSIS — I5022 Chronic systolic (congestive) heart failure: Secondary | ICD-10-CM

## 2019-06-05 DIAGNOSIS — Z95810 Presence of automatic (implantable) cardiac defibrillator: Secondary | ICD-10-CM | POA: Diagnosis not present

## 2019-06-07 ENCOUNTER — Ambulatory Visit (HOSPITAL_BASED_OUTPATIENT_CLINIC_OR_DEPARTMENT_OTHER)
Admission: RE | Admit: 2019-06-07 | Discharge: 2019-06-07 | Disposition: A | Discharge status: Home / Self Care | Payer: Medicare HMO | Source: Ambulatory Visit | Attending: Internal Medicine | Admitting: Internal Medicine

## 2019-06-07 ENCOUNTER — Other Ambulatory Visit (HOSPITAL_COMMUNITY): Payer: Self-pay

## 2019-06-07 ENCOUNTER — Encounter (HOSPITAL_COMMUNITY): Payer: Self-pay | Admitting: Internal Medicine

## 2019-06-07 ENCOUNTER — Other Ambulatory Visit: Payer: Self-pay

## 2019-06-07 ENCOUNTER — Ambulatory Visit (HOSPITAL_COMMUNITY)
Admission: RE | Admit: 2019-06-07 | Discharge: 2019-06-07 | Disposition: A | Discharge status: Home / Self Care | Payer: Medicare HMO | Source: Ambulatory Visit | Attending: Internal Medicine | Admitting: Internal Medicine

## 2019-06-07 VITALS — BP 118/64 | HR 72 | Wt 175.6 lb

## 2019-06-07 DIAGNOSIS — F411 Generalized anxiety disorder: Secondary | ICD-10-CM | POA: Insufficient documentation

## 2019-06-07 DIAGNOSIS — Z87730 Personal history of (corrected) cleft lip and palate: Secondary | ICD-10-CM | POA: Diagnosis not present

## 2019-06-07 DIAGNOSIS — I872 Venous insufficiency (chronic) (peripheral): Secondary | ICD-10-CM

## 2019-06-07 DIAGNOSIS — Z79899 Other long term (current) drug therapy: Secondary | ICD-10-CM | POA: Diagnosis not present

## 2019-06-07 DIAGNOSIS — I951 Orthostatic hypotension: Secondary | ICD-10-CM

## 2019-06-07 DIAGNOSIS — E039 Hypothyroidism, unspecified: Secondary | ICD-10-CM | POA: Diagnosis not present

## 2019-06-07 DIAGNOSIS — I5022 Chronic systolic (congestive) heart failure: Secondary | ICD-10-CM

## 2019-06-07 DIAGNOSIS — Z853 Personal history of malignant neoplasm of breast: Secondary | ICD-10-CM | POA: Insufficient documentation

## 2019-06-07 DIAGNOSIS — J45909 Unspecified asthma, uncomplicated: Secondary | ICD-10-CM | POA: Insufficient documentation

## 2019-06-07 DIAGNOSIS — E1122 Type 2 diabetes mellitus with diabetic chronic kidney disease: Secondary | ICD-10-CM | POA: Diagnosis not present

## 2019-06-07 DIAGNOSIS — I130 Hypertensive heart and chronic kidney disease with heart failure and stage 1 through stage 4 chronic kidney disease, or unspecified chronic kidney disease: Secondary | ICD-10-CM | POA: Insufficient documentation

## 2019-06-07 DIAGNOSIS — I071 Rheumatic tricuspid insufficiency: Secondary | ICD-10-CM | POA: Insufficient documentation

## 2019-06-07 DIAGNOSIS — F329 Major depressive disorder, single episode, unspecified: Secondary | ICD-10-CM | POA: Insufficient documentation

## 2019-06-07 DIAGNOSIS — Z9221 Personal history of antineoplastic chemotherapy: Secondary | ICD-10-CM | POA: Insufficient documentation

## 2019-06-07 DIAGNOSIS — M109 Gout, unspecified: Secondary | ICD-10-CM | POA: Insufficient documentation

## 2019-06-07 DIAGNOSIS — N183 Chronic kidney disease, stage 3 unspecified: Secondary | ICD-10-CM | POA: Diagnosis not present

## 2019-06-07 DIAGNOSIS — E785 Hyperlipidemia, unspecified: Secondary | ICD-10-CM | POA: Diagnosis not present

## 2019-06-07 DIAGNOSIS — Z87891 Personal history of nicotine dependence: Secondary | ICD-10-CM | POA: Insufficient documentation

## 2019-06-07 DIAGNOSIS — Z6834 Body mass index (BMI) 34.0-34.9, adult: Secondary | ICD-10-CM | POA: Insufficient documentation

## 2019-06-07 DIAGNOSIS — R011 Cardiac murmur, unspecified: Secondary | ICD-10-CM | POA: Insufficient documentation

## 2019-06-07 DIAGNOSIS — I878 Other specified disorders of veins: Secondary | ICD-10-CM | POA: Diagnosis not present

## 2019-06-07 DIAGNOSIS — I428 Other cardiomyopathies: Secondary | ICD-10-CM

## 2019-06-07 DIAGNOSIS — Z791 Long term (current) use of non-steroidal anti-inflammatories (NSAID): Secondary | ICD-10-CM | POA: Diagnosis not present

## 2019-06-07 DIAGNOSIS — I5032 Chronic diastolic (congestive) heart failure: Secondary | ICD-10-CM

## 2019-06-07 DIAGNOSIS — I447 Left bundle-branch block, unspecified: Secondary | ICD-10-CM | POA: Diagnosis not present

## 2019-06-07 DIAGNOSIS — E876 Hypokalemia: Secondary | ICD-10-CM | POA: Diagnosis not present

## 2019-06-07 DIAGNOSIS — Z79890 Hormone replacement therapy (postmenopausal): Secondary | ICD-10-CM | POA: Diagnosis not present

## 2019-06-07 DIAGNOSIS — M1711 Unilateral primary osteoarthritis, right knee: Secondary | ICD-10-CM | POA: Diagnosis not present

## 2019-06-07 DIAGNOSIS — R296 Repeated falls: Secondary | ICD-10-CM

## 2019-06-07 DIAGNOSIS — K219 Gastro-esophageal reflux disease without esophagitis: Secondary | ICD-10-CM | POA: Insufficient documentation

## 2019-06-07 DIAGNOSIS — Z923 Personal history of irradiation: Secondary | ICD-10-CM | POA: Diagnosis not present

## 2019-06-07 LAB — BASIC METABOLIC PANEL
Anion gap: 10 (ref 5–15)
BUN: 22 mg/dL (ref 8–23)
CO2: 24 mmol/L (ref 22–32)
Calcium: 8.8 mg/dL — ABNORMAL LOW (ref 8.9–10.3)
Chloride: 108 mmol/L (ref 98–111)
Creatinine, Ser: 1.57 mg/dL — ABNORMAL HIGH (ref 0.44–1.00)
GFR calc Af Amer: 39 mL/min — ABNORMAL LOW (ref >60)
GFR calc non Af Amer: 34 mL/min — ABNORMAL LOW (ref >60)
Glucose, Bld: 100 mg/dL — ABNORMAL HIGH (ref 70–99)
Potassium: 3.6 mmol/L (ref 3.5–5.1)
Sodium: 142 mmol/L (ref 135–145)

## 2019-06-07 LAB — CBC
HCT: 36.2 % (ref 36.0–46.0)
Hemoglobin: 11.8 g/dL — ABNORMAL LOW (ref 12.0–15.0)
MCH: 33.7 pg (ref 26.0–34.0)
MCHC: 32.6 g/dL (ref 30.0–36.0)
MCV: 103.4 fL — ABNORMAL HIGH (ref 80.0–100.0)
Platelets: 253 10*3/uL (ref 150–400)
RBC: 3.5 MIL/uL — ABNORMAL LOW (ref 3.87–5.11)
RDW: 13.7 % (ref 11.5–15.5)
WBC: 4.6 10*3/uL (ref 4.0–10.5)
nRBC: 0 % (ref 0.0–0.2)

## 2019-06-07 LAB — BRAIN NATRIURETIC PEPTIDE: B Natriuretic Peptide: 160.5 pg/mL — ABNORMAL HIGH (ref 0.0–100.0)

## 2019-06-07 NOTE — Progress Notes (Signed)
Paramedicine Encounter   Patient ID: Rhonda [name] , female,   DOB: 1950-12-08,68 y.o.,  MRN: [id number]   Met patient in clinic today with provider Dr. Leonia Reeves.   Pt had a eco done earlier today.  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  Dr. Missy Sabins said that he's willing to talk with the physician/PA  about possible medication.  The results showed that her heart function is "completely normal" at this point.  She denies sob, chest pain and dizziness.  Mr. Gelinas says that her legs are swollen more than normal, she's hasn't been wearing compression stalking's (they're too tight). Mr. Garrott was given information to where.  He instructed her to get the correct size and start back using them.    Pt's weight @ 177 today in clinic which is up from 12/31 (05/25/19).    Time spent with patient 45 mins No medication change during this visit  [place], [place] 06/07/2019   ACTION: Home visit completed

## 2019-06-07 NOTE — Progress Notes (Signed)
ADVANCED HF CLINIC NOTE  Patient ID: Rhonda [name], female   DOB: 10/24/1950, 69 y.o.   MRN: [id number]  Primary Cardiologist: Dr Radford Pax  General Surgeon: Dr Donne Hazel  Orthopedic: Dr Alvan Dame  PCP: Dr Justin Mend Referring MD: Dr Radford Pax  HPI: Rhonda [name] is a 69 y.o. 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 She has had sleep study with no  evidence of sleep apnea.  She has a Medtronic CRT-D device. Echo in 7/18 showed recovery of EF to 55-60%.    Has been followed by Narda Amber in Neurology. Recently added Florinef for orthostatic hypotension (failed mestinon). Also on midodrine. Says she hasn't had any falls in 2 weeks.   Today she returns for HF follow up. Overall feeling ok. As above, no falls x 2 weeks. Worried about narcolepsy and has sleep study pending. Had neuro-psych eval and failed. Says her memory isn't good. Followed by HF Paramedicine. Denies significant SOB, orthopnea or PND. + LE edema. Weight up 10 pound in last month. Not wearing compression hose. (they too tight")  PYP 4/19 negative TTR  ICD checked personally. Activity < 1hr/day No VT/AF. Impedance down chronically.   Echo today 06/07/19: EF 60-65% grade II DD. RV ok. Personally reviewed   10/14/12 ECHO EF 35% RV ok  04/12/13 ECHO EF 20-25%, LV moderately dilated 10/11/2013 ECHO EF 45-50% 7/18 ECHO EF 55-60%, normal RV size and systolic function, PASP 34 mmHg  11/24/12 Creatinine 1.23 Potassium 4.1 12/15/12 Creatinine 2.03 Potassium 3.3  12/22/12 Creatinine 1.2 Potassium 3.8 02/23/13 Creatinine 1.68 K 4.4  05/02/13 K 4.2 Creatinine 1.8  10/11/13 K 3.7 Creatinine 1.69 10/17 K 4.1, creatinine  1.35 09/2017: K3.8 Creatinine 1.57  Review of systems complete and found to be negative unless listed in HPI.   Past Medical History:  Diagnosis Date  . Anemia   . Arthritis    Right knee  . Asthma   . Back pain    Disk problem  . Breast cancer, stage 1 ([place]) 03/26/2011   Left; completed chemotherapy and radiation treatments  . Cardiomyopathy   . Chronic respiratory failure ([place]) 09/14/2013  . Chronic systolic heart failure ([facility])    a) NICM b) ECHO (03/2013) EF 20-25% c) ECHO (09/2013) EF 45-50%, grade I DD  . CKD (chronic kidney disease) stage 3, GFR 30-59 ml/min 11/08/2018  . Complication of anesthesia    History of low blood pressure after surgery; attributed to lying flat  . Diabetes mellitus    "diet controlled" (05/03/2013)  . Exertional shortness of breath   . Generalized anxiety disorder   . GERD (gastroesophageal reflux disease)   . Gout   . Heart murmur   . Hyperlipidemia   . Hypertension   . Hypokalemia 11/08/2018  . Hypothyroidism   . Left bundle branch block    s/p CRT-D (04/2013)  . Major depressive disorder   . Migraines   . On home oxygen therapy    "2L suppose to be q night" (05/03/2013)  . Orthostatic hypotension 07/28/2017  . Peripheral neuropathy    Feet  . SVD (spontaneous vaginal delivery)    x 2  . Unspecified vitamin D deficiency 03/26/2011   Does not  take meds    Current Outpatient Medications  Medication Sig Dispense Refill  . acetaZOLAMIDE (DIAMOX) 125 MG tablet TAKE 1 TABLET(125 MG) BY MOUTH DAILY 90 tablet 3  . buPROPion (WELLBUTRIN XL) 150 MG 24 hr tablet Take 450 mg by mouth every morning.     . carboxymethylcellulose (REFRESH PLUS) 0.5 % SOLN Place 1 drop into both eyes 3 (three) times daily as needed (dry eyes).    . carvedilol (COREG) 3.125 MG tablet Take 1 tablet (3.125 mg total) by mouth 2 (two) times daily with a meal. 60 tablet 3  . citalopram (CELEXA) 20 MG tablet Take 20 mg by mouth daily.    . clonazePAM (KLONOPIN) 0.5 MG tablet  Take 0.5 mg by mouth 3 (three) times daily as needed for anxiety.     . fexofenadine (ALLEGRA) 180 MG tablet Take 180 mg by mouth 2 (two) times daily.    . fludrocortisone (FLORINEF) 0.1 MG tablet Take 1 tablet (0.1 mg total) by mouth 2 (two) times daily. 60 tablet 3  . gabapentin (NEURONTIN) 300 MG capsule Take 300 mg by mouth 3 (three) times daily.     Marland Kitchen gabapentin (NEURONTIN) 800 MG tablet Take 800 mg by mouth at bedtime as needed (neuropathy).     Marland Kitchen ipratropium (ATROVENT) 0.03 % nasal spray Place 2 sprays into both nostrils 2 (two) times daily.    Marland Kitchen levothyroxine (SYNTHROID) 75 MCG tablet Take 1 tablet (75 mcg total) by mouth daily. 90 tablet 3  . levothyroxine (SYNTHROID) 88 MCG tablet TAKE 1 TABLET(88 MCG) BY MOUTH DAILY BEFORE BREAKFAST 45 tablet 1  . metolazone (ZAROXOLYN) 2.5 MG tablet Take 1 tablet (2.5 mg total) by mouth daily as needed (edema). Take only as directed by CHF clinic    . midodrine (PROAMATINE) 10 MG tablet Take 1 tablet (10 mg total) by mouth 3 (three) times daily with meals. 90 tablet 3  . mirtazapine (REMERON) 30 MG tablet mirtazapine 30 mg tablet    . montelukast (SINGULAIR) 10 MG tablet Take 10 mg by mouth at bedtime.   1  . Nabumetone (RELAFEN PO) nabumetone    . potassium chloride SA (KLOR-CON) 20 MEQ tablet Take 1 tablet (20 mEq total) by mouth 2 (two) times daily. 60 tablet 11  . probenecid (BENEMID) 500 MG tablet Take 500 mg by mouth 2 (two) times daily.    . simvastatin (ZOCOR) 10 MG tablet Take 10 mg by mouth every evening.     Marland Kitchen spironolactone (ALDACTONE) 25 MG tablet TAKE 1/2 TABLET BY MOUTH EVERY EVENING 45 tablet 1  . torsemide (DEMADEX) 20 MG tablet Take 3 tablets (60 mg total) by mouth 2 (two) times daily. 180 tablet 6  . zolpidem (AMBIEN) 10 MG tablet Take 10 mg by mouth at bedtime as needed for sleep. For sleep      No current facility-administered medications for this encounter.    Vitals:   06/07/19 1052  BP: 118/64  Pulse: 72  SpO2: 99%   Weight: 79.7 kg (175 lb 9.6 oz)  Sitting 144/96  Standing 102/84   Wt Readings from Last 3 Encounters:  06/07/19 79.7 kg (175 lb 9.6 oz)  05/25/19 76.2 kg (168 lb)  04/27/19 77.1 kg (170 lb)    PHYSICAL EXAM: General:  Elderly. No resp difficulty HEENT: normal Neck: supple. no JVD. Carotids 2+ bilat; no bruits. No lymphadenopathy or thryomegaly appreciated. Cor: PMI nondisplaced. Regular rate & rhythm. 2/6 TR Lungs: clear Abdomen: soft, nontender, nondistended. No hepatosplenomegaly. No  bruits or masses. Good bowel sounds. Extremities: no cyanosis, clubbing, rash, 2+ edema  Varicose veins  Neuro: alert & orientedx3, cranial nerves grossly intact. moves all 4 extremities w/o difficulty. Affect pleasant  ASSESSMENT & PLAN:  1) Chronic systolic HF: NICM s/p Medtronic CRT-D, ?Viral myocarditis. Cardiomyopathy dates from 57. EF 45-50% (09/2013), EF up to 55-60% on 20202.  PYP 09/02/17 negative for TTR (Grade 0-1, H/CCL 1.2). SPEP with no M-spike.  - Echo today 06/07/19: EF 60-65% grade II DD. RV ok. Personally reviewed - Chronic NYHA III. - On low dose carvedilol 3.125 bid - On spiro 12.5 mg daily.  - On torsemide 60/40  - She continues to have significant LE edema and impedance down on Optivol (this is chronic and not sure it is accurate). Suspect main issue is venous insufficiency. Have emphasized need to wrap legs and keep them elevated. Will check BNP -  Continue HF paramedicine.  - Check labs today 2) Obesity -Body mass index is 34.29 kg/m. - continue weight loss efforts 3) HTN Stable.  4) Orthostatic Hypotension.  - Neurology managing. She is also on diamox for increased ICP - Continue midodrine to 10 mg TID.  5) Chronic Venous stasis - plan as above 6) Falls  - Ongoing issue due to orthostasis. Improved. SeeingNeurology.  7) CKD 3 - Creatinine baseline 1.4-1.6 - check BMET today  Glori Bickers MD 11:08 AM

## 2019-06-07 NOTE — Progress Notes (Signed)
  Echocardiogram 2D Echocardiogram has been performed.  Rhonda [name] 06/07/2019, 11:05 AM

## 2019-06-07 NOTE — Patient Instructions (Signed)
Labs today We will only contact you if something comes back abnormal or we need to make some changes. Otherwise no news is good news!  PLEASE KEEP YOUR LEGS WRAPPED AND ELEVATED, ESPECIALLY DURING THE DAY!  Your physician recommends that you schedule a follow-up appointment in: 6 months.   We will contact you in 5 months to schedule your next appointment.  Please call office at [phone] option 2 if you have any questions or concerns.   At the [facility], you and your health needs are our priority. As part of our continuing mission to provide you with exceptional heart care, we have created designated Provider Care Teams. These Care Teams include your primary Cardiologist (physician) and Advanced Practice Providers (APPs- Physician Assistants and Nurse Practitioners) who all work together to provide you with the care you need, when you need it.   You may see any of the following providers on your designated Care Team at your next follow up: Marland Kitchen Dr Glori Bickers . Dr Loralie Champagne . Darrick Grinder, NP . Lyda Jester, PA . Audry Riles, PharmD   Please be sure to bring in all your medications bottles to every appointment.

## 2019-06-07 NOTE — Progress Notes (Signed)
EPIC Encounter for ICM Monitoring  Patient Name: Rhonda [name] is a 69 y.o. female Date: 06/07/2019 Primary Care Physican: Maurice Small, MD Primary Cardiologist:Bensimhon Electrophysiologist: Santina Evans Pacing:99.9% 05/02/2019 Weight: 169.1lbs   Patient has HF clinic office visit today, 06/07/19.  Optivol thoracic impedancecontinues to suggestpossibleongoingfluid accumulation since February.    Prescribed: Patient participating in paramedicine program to assist with meds. Torsemide 20 mg3tablets (60 mg total)by mouthtwice a day. Potassium 20 meq1tablets (20 mEq total)twice a day.  Metolazone 2.5mg  1 tablet by mouth if needed for fluid or edema.  Labs: 03/30/2019 Creatinine 1.68, BUN 24, Potassium 3.6, Sodium 139, GFR 31-36 01/11/2019 Creatinine 1.84, BUN 35, Potassium 4.2, Sodium 140, GFR 28-33 11/10/2018 Creatinine1.21, BUN25, Potassium3.9, Sodium138, [id number] 11/09/2018 Creatinine1.53, BUN22, Potassium2.9, [id number], [id number]  11/08/2018 Creatinine1.75, BUN25, Potassium<2.0, Sodium141, [id number]  10/27/2018 Creatinine1.44, BUN15, Potassium5.2, [id number], [id number] A complete set of results can be found in Results Review.  Recommendations:  Recommendations will be given at today's office visit if needed.  Follow-up plan: ICM clinic phone appointment on 07/10/2019.   91 day device clinic remote transmission 07/19/2019.  Office appt 06/07/2019 with Dr. Haroldine Laws.    Copy of ICM check sent to Dr. Lovena Le.   3 month ICM trend: 06/05/2019    1 Year ICM trend:       [name], RN 06/07/2019 9:20 AM

## 2019-06-08 ENCOUNTER — Ambulatory Visit: Payer: Medicare HMO | Attending: Internal Medicine

## 2019-06-08 ENCOUNTER — Other Ambulatory Visit (HOSPITAL_COMMUNITY): Payer: Self-pay

## 2019-06-08 DIAGNOSIS — Z20822 Contact with and (suspected) exposure to covid-19: Secondary | ICD-10-CM | POA: Diagnosis not present

## 2019-06-08 NOTE — Progress Notes (Signed)
Paramedicine Encounter    Patient ID: Rhonda [name], female    DOB: [birth year], 69 y.o.   MRN: [id number]    Patient Care Team: Maurice Small, MD as PCP - General (Family Medicine) [name], LCSW as Social Worker (Licensed Clinical Social Worker) [name], [name], DO as Consulting Physician (Neurology)  Patient Active Problem List   Diagnosis Date Noted  . Hypokalemia 11/08/2018  . CKD (chronic kidney disease) stage 3, GFR 30-59 ml/min 11/08/2018  . Orthostatic hypotension 07/28/2017  . SVD (spontaneous vaginal delivery)   . Peripheral neuropathy   . On home oxygen therapy   . Migraines   . Left bundle branch block   . Hypothyroidism   . Hypertension   . Hyperlipidemia   . Heart murmur   . GERD (gastroesophageal reflux disease)   . Exertional shortness of breath   . Major depressive disorder   . Back pain   . Arthritis   . Generalized anxiety disorder   . Anemia   . Chronic respiratory failure ([place]) 09/14/2013  . 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 Morbid obesity ([place]) 11/01/2012  . Chronic systolic heart failure ([place]) 10/27/2012  . Endometrial polyp 01/20/2012  . Unspecified vitamin D deficiency 03/26/2011    Current Outpatient Medications:  .  acetaZOLAMIDE (DIAMOX) 125 MG tablet, TAKE 1 TABLET(125 MG) BY MOUTH DAILY, Disp: 90 tablet, Rfl: 3 .  buPROPion (WELLBUTRIN XL) 150 MG 24 hr tablet, Take 450 mg by mouth every morning. , Disp: , Rfl:  .  carvedilol (COREG) 3.125 MG tablet, Take 1 tablet (3.125 mg total) by mouth 2 (two) times daily with a meal., Disp: 60 tablet, Rfl: 3 .  citalopram (CELEXA) 20 MG tablet, Take 20 mg by mouth daily., Disp: , Rfl:  .  clonazePAM (KLONOPIN) 0.5 MG tablet, Take 0.5 mg by mouth 3 (three) times daily as needed for anxiety. , Disp: , Rfl:  .  fludrocortisone (FLORINEF) 0.1 MG tablet, Take 1 tablet (0.1 mg total) by mouth 2 (two) times daily., Disp: 60 tablet, Rfl: 3 .  gabapentin  (NEURONTIN) 300 MG capsule, Take 300 mg by mouth 3 (three) times daily. , Disp: , Rfl:  .  levothyroxine (SYNTHROID) 75 MCG tablet, Take 1 tablet (75 mcg total) by mouth daily., Disp: 90 tablet, Rfl: 3 .  midodrine (PROAMATINE) 10 MG tablet, Take 1 tablet (10 mg total) by mouth 3 (three) times daily with meals., Disp: 90 tablet, Rfl: 3 .  potassium chloride SA (KLOR-CON) 20 MEQ tablet, Take 1 tablet (20 mEq total) by mouth 2 (two) times daily., Disp: 60 tablet, Rfl: 11 .  probenecid (BENEMID) 500 MG tablet, Take 500 mg by mouth 2 (two) times daily., Disp: , Rfl:  .  simvastatin (ZOCOR) 10 MG tablet, Take 10 mg by mouth every evening. , Disp: , Rfl:  .  spironolactone (ALDACTONE) 25 MG tablet, TAKE 1/2 TABLET BY MOUTH EVERY EVENING, Disp: 45 tablet, Rfl: 1 .  carboxymethylcellulose (REFRESH PLUS) 0.5 % SOLN, Place 1 drop into both eyes 3 (three) times daily as needed (dry eyes)., Disp: , Rfl:  .  fexofenadine (ALLEGRA) 180 MG tablet, Take 180 mg by mouth 2 (two) times daily., Disp: , Rfl:  .  gabapentin (NEURONTIN) 800 MG tablet, Take 800 mg by mouth at bedtime as needed (neuropathy). , Disp: , Rfl:  .  ipratropium (ATROVENT) 0.03 % nasal spray, Place 2 sprays into both nostrils 2 (two) times daily., Disp: , Rfl:  .  levothyroxine (SYNTHROID) 88 MCG tablet, TAKE 1 TABLET(88 MCG) BY MOUTH DAILY BEFORE BREAKFAST, Disp: 45 tablet, Rfl: 1 .  metolazone (ZAROXOLYN) 2.5 MG tablet, Take 1 tablet (2.5 mg total) by mouth daily as needed (edema). Take only as directed by CHF clinic, Disp: , Rfl:  .  mirtazapine (REMERON) 30 MG tablet, mirtazapine 30 mg tablet, Disp: , Rfl:  .  montelukast (SINGULAIR) 10 MG tablet, Take 10 mg by mouth at bedtime. , Disp: , Rfl: 1 .  Nabumetone (RELAFEN PO), nabumetone, Disp: , Rfl:  .  torsemide (DEMADEX) 20 MG tablet, Take 3 tablets (60 mg total) by mouth 2 (two) times daily., Disp: 180 tablet, Rfl: 6 .  zolpidem (AMBIEN) 10 MG tablet, Take 10 mg by mouth at bedtime as needed  for sleep. For sleep , Disp: , Rfl:  Allergies  Allergen Reactions  . Ceftin Anaphylaxis    Face and throat swell   . Shellfish Allergy Other (See Comments)    Gout exacerbation  . Geodon [Ziprasidone Hcl] Hives  . Lisinopril Other (See Comments)    angioedema  . Allopurinol Nausea Only and Other (See Comments)    weakness  . Ativan [Lorazepam] Itching  . Sulfa Antibiotics Itching  . Ultram [Tramadol Hcl] Itching     Social History   Socioeconomic History  . Marital status: Married    Spouse name: Not on file  . Number of children: 2  . Years of education: 78  . Highest education level: Master's degree (e.g., MA, MS, MEng, MEd, MSW, MBA)  Occupational History  . Occupation: retired  Tobacco Use  . Smoking status: Former Smoker    Packs/day: 0.10    Years: 26.00    Pack years: 2.60    Types: Cigarettes    Quit date: 05/06/2007    Years since quitting: 12.1  . Smokeless tobacco: Never Used  Substance and Sexual Activity  . Alcohol use: No  . Drug use: No  . Sexual activity: Yes  Other Topics Concern  . 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 Difficulty of Paying Living Expenses: Not on file  Food Insecurity:   . Worried About Charity fundraiser in the Last Year: Not on file  . Ran Out of Food in the Last Year: Not on file  Transportation Needs:   . Lack of Transportation (Medical): Not on file  . Lack of Transportation (Non-Medical): Not on file  Physical Activity:   . Days of Exercise per Week: Not on file  . Minutes of Exercise per Session: Not on file  Stress:   . Feeling of Stress  : Not on file  Social Connections:   . Frequency of Communication with Friends and Family: Not on file  . Frequency of Social Gatherings with Friends and Family: Not on file  . Attends Religious Services: Not on file  . Active Member of Clubs or Organizations: Not on file  . Attends Archivist Meetings: Not on file  . Marital Status:  Not on file  Intimate Partner Violence:   . Fear of Current or Ex-Partner: Not on file  . Emotionally Abused: Not on file  . Physically Abused: Not on file  . Sexually Abused: Not on file    Physical Exam Pulmonary:     Effort: No respiratory distress.     Breath sounds: No wheezing.  Abdominal:     General: There is no distension.  Skin:    General: Skin is warm and dry.         Future Appointments  Date Time Provider [place]  06/14/2019 10:00 AM Philemon Kingdom, MD [facility] None  07/10/2019  9:05 AM [facility] DEVICE REMOTES [facility]  07/19/2019  7:30 AM [facility] DEVICE REMOTES [facility]  08/10/2019  9:50 AM Narda Amber K, DO [facility] None  10/18/2019  7:30 AM [facility] DEVICE REMOTES [facility]  01/17/2020  7:30 AM [facility] DEVICE REMOTES [facility]  04/17/2020  7:30 AM [facility] DEVICE REMOTES [facility]     BP 98/60 (BP Location: Right Arm, Patient Position: Sitting, Cuff Size: Large)   Pulse 71   Temp 97.8 F (36.6 C)   Resp 15   SpO2 97%    ATF pt CAO to her norm watching tv with no complaints.  [facility] which she feels went very well.  There were no med changes during that visit.  She denies sob, chest pain and dizziness.  Today is a revisit, to refill boxes (2).  She's missing several meds; her husband will pick them up and I'll f/u later.    Medication ordered: Midodrine filled until tue afternoon 1st box buprupion filled fri/sat only 1st box  Clonazepam filled  until mon 1st box Duloxetine taking for only a month to see if there is any change   Navia Lindahl, EMT Paramedic [phone] 06/09/2019    ACTION: Home visit completed

## 2019-06-10 LAB — NOVEL CORONAVIRUS, NAA: SARS-CoV-2, NAA: NOT DETECTED

## 2019-06-12 ENCOUNTER — Other Ambulatory Visit: Payer: Self-pay

## 2019-06-14 ENCOUNTER — Telehealth (HOSPITAL_COMMUNITY): Payer: Self-pay | Admitting: *Deleted

## 2019-06-14 ENCOUNTER — Ambulatory Visit (INDEPENDENT_AMBULATORY_CARE_PROVIDER_SITE_OTHER): Payer: Medicare HMO | Admitting: Internal Medicine

## 2019-06-14 ENCOUNTER — Encounter: Payer: Self-pay | Admitting: Internal Medicine

## 2019-06-14 ENCOUNTER — Other Ambulatory Visit (HOSPITAL_COMMUNITY): Payer: Self-pay

## 2019-06-14 VITALS — BP 130/80 | HR 71 | Ht 61.0 in | Wt 180.0 lb

## 2019-06-14 DIAGNOSIS — E213 Hyperparathyroidism, unspecified: Secondary | ICD-10-CM

## 2019-06-14 DIAGNOSIS — E039 Hypothyroidism, unspecified: Secondary | ICD-10-CM

## 2019-06-14 NOTE — Progress Notes (Signed)
Rhonda [name] was seen at home today at the request of the HF clinic. Her weight had increased and they wanted to increase her torsemide to 80 mg BID for the next two days. I made this change as well as added medications to her pillbox which had been recently picked up. Rhonda [name] will follow up next week.   Rhonda [name], EMT 06/14/19

## 2019-06-14 NOTE — Progress Notes (Signed)
Patient ID: Rhonda [name], female   DOB: 08/16/1950, 69 y.o.   MRN: [id number]   This visit occurred during the SARS-CoV-2 public health emergency.  Safety protocols were in place, including screening questions prior to the visit, additional usage of staff PPE, and extensive cleaning of exam room while observing appropriate contact time as indicated for disinfecting solutions.   HPI  Rhonda [name] is a 69 y.o.-year-old female, initially referred by her neurologist, Dr. [name], presenting for follow-up for hypothyroidism. Last visit 6 months ago (virtual).  Pt. has been dx with hypothyroidism in ~2009 -started on levothyroxine, currently on 88 mcg daily, dose decreased 01/2019.  She did not return for labs after the last increase in dose.  At the previous visit, she was not taking levothyroxine correctly (at night, after dinner, occasionally skipping doses, + colestipol and multivitamins) but we discussed at every visit about how to take them correctly and she is not doing so.  She is taking the levothyroxine: - in am - fasting - at least 30 min from b'fast - no Ca, Fe, PPIs - + Multivitamins in the p.m. - not on Biotin  She started taking colestipol 04/2018 and she was taking this in the morning.  At last visit, she came off the resin.  Reviewed her TFTs: Lab Results  Component Value Date   TSH 0.21 (L) 02/22/2019   TSH 0.26 (L) 12/21/2018   TSH 0.168 (L) 11/08/2018   Previously: Lab Results  Component Value Date   TSH 3.69 08/05/2009   TSH 0.372  11/02/2008   TSH 0.781 09/16/2008   FREET4 1.25 12/09/2017   T3FREE 2.9 12/09/2017   Antithyroid antibodies: No results found for: THGAB No components found for: TPOAB  Pt denies: - feeling nodules in neck - hoarseness - dysphagia - choking - SOB with lying down  She has no FH of thyroid disorders. No FH of thyroid cancer. No h/o radiation tx to head or neck.  No herbal supplements. No Biotin use. No recent steroids use.    She has secondary hyperparathyroidism, likely due to CKD: Lab Results  Component Value Date   PTH 31 02/10/2018   PTH 109 (H) 12/08/2017   CALCIUM 8.8 (L) 06/07/2019   CALCIUM 9.0 03/30/2019   CALCIUM 9.2 01/11/2019   CALCIUM 9.0 11/10/2018   CALCIUM 8.7 (L) 11/10/2018   CALCIUM 8.8 (L) 11/09/2018   CALCIUM 8.8 (L) 11/08/2018   CALCIUM 9.3 10/27/2018   CALCIUM 8.8 (L) 07/26/2018   CALCIUM 9.0 07/18/2018   No vitamin D deficiency: Lab Results  Component Value Date   VD25OH 39.48 02/10/2018   VD25OH 49 07/16/2011  She takes a multivitamin.  + CKD: Lab Results  Component Value Date   BUN 22 06/07/2019   Lab Results  Component Value Date   CREATININE 1.57 (H) 06/07/2019   No history of kidney stones or osteoporosis.  She has a history of breast cancer.  ROS: Constitutional: + weight gain/no weight loss, no fatigue, no subjective hyperthermia, no subjective hypothermia Eyes: no blurry vision, no xerophthalmia ENT: no sore throat, + see HPI Cardiovascular: no CP/no SOB/no palpitations/+ leg swelling Respiratory: no cough/no SOB/no wheezing Gastrointestinal: no N/no V/no D/no C/no acid reflux Musculoskeletal: no muscle aches/no joint aches Skin: no rashes, no hair loss Neurological: no tremors/no numbness/no tingling/no dizziness  I reviewed pt's medications, allergies, PMH, social hx, family hx, and changes were documented in the history of present illness. Otherwise, unchanged from my initial visit note.  Past  Medical History:  Diagnosis Date  . Anemia   . Arthritis    Right knee  . Asthma   . Back pain    Disk problem  . Breast cancer, stage 1 ([place]) 03/26/2011   Left; completed chemotherapy and radiation treatments  . Cardiomyopathy   . Chronic respiratory failure ([place]) 09/14/2013  . Chronic systolic heart failure (H[facility])    a) NICM b) ECHO (03/2013) EF 20-25% c) ECHO (09/2013) EF 45-50%, grade I DD  . CKD (chronic kidney disease) stage 3, GFR 30-59 ml/min  11/08/2018  . Complication of anesthesia    History of low blood pressure after surgery; attributed to lying flat  . Diabetes mellitus    "diet controlled" (05/03/2013)  . Exertional shortness of breath   . Generalized anxiety disorder   . GERD (gastroesophageal reflux disease)   . Gout   . Heart murmur   . Hyperlipidemia   . Hypertension   . Hypokalemia 11/08/2018  . Hypothyroidism   . Left bundle branch block    s/p CRT-D (04/2013)  . Major depressive disorder   . Migraines   . On home oxygen therapy    "2L suppose to be q night" (05/03/2013)  . Orthostatic hypotension 07/28/2017  . Peripheral neuropathy    Feet  . SVD (spontaneous vaginal delivery)    x 2  . Unspecified vitamin D deficiency 03/26/2011   Does not take meds   Past Surgical History:  Procedure Laterality Date  . BI-VENTRICULAR IMPLANTABLE CARDIOVERTER DEFIBRILLATOR N/A 05/03/2013   Procedure: BI-VENTRICULAR IMPLANTABLE CARDIOVERTER DEFIBRILLATOR  (CRT-D);  Surgeon: Evans Lance, MD;  Location[facility] C[facility] LAB;  Service: Cardiovascular;  Laterality: N/A;  . BI-VENTRICULAR IMPLANTABLE CARDIOVERTER DEFIBRILLATOR  (CRT-D)  05/03/2013   MDT CRTD implanted by Dr Lovena Le for non ischemic cardiomyopathy  . BIOPSY  05/31/2018   Procedure: BIOPSY;  Surgeon: Ronnette Juniper, MD;  Location: [facility] ENDOSCOPY;  Service: Gastroenterology;;  . BREAST LUMPECTOMY Left 07/28/2010  . CARDIAC CATHETERIZATION  2010   NORMAL CORONARY ARTERIES  . 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--11 SURGERIES     PT HAS REMOVABLE SPEECH BULB-TAKES IT OUT BEFORE HER SURGERY  . COLONOSCOPY    . COLONOSCOPY N/A 05/31/2018   Procedure: COLONOSCOPY;  Surgeon: Ronnette Juniper, MD;  Location: [facility];  Service: Gastroenterology;  Laterality: N/A;  . HYSTEROSCOPY WITH D & C  01/20/2012   Procedure: DILATATION AND CURETTAGE /HYSTEROSCOPY;  Surgeon: Margarette Asal, MD;  Location: [facility];  Service: Gynecology;  Laterality: N/A;  with trueclear  . PORT-A-CATH REMOVAL   09/23/2011   Procedure: REMOVAL PORT-A-CATH;  Surgeon: Rolm Bookbinder, MD;  Location: [facility];  Service: General;  Laterality: N/A;  Port Removal  . PORTACATH PLACEMENT  2012  . TONSILLECTOMY  1960's  . TOTAL KNEE ARTHROPLASTY  05/10/2012   Procedure: TOTAL KNEE ARTHROPLASTY;  Surgeon: Mauri Pole, MD;  Location: [facility];  Service: Orthopedics;  Laterality: Right;  . WISDOM TOOTH EXTRACTION     Social History   Socioeconomic History  . Marital status: Married    Spouse name: Not on file  . Number of children: 2  . Years of education: 33  . Highest education level: Master's degree (e.g., MA, MS, MEng, MEd, MSW, MBA)  Occupational History  . Occupation: retired  Tobacco Use  . Smoking status: Former Smoker    Packs/day: 0.10    Years: 26.00    Pack years: 2.60    Types: Cigarettes    Quit date:  05/06/2007    Years since quitting: 12.1  . Smokeless tobacco: Never Used  Substance and Sexual Activity  . Alcohol use: No  . Drug use: No  . Sexual activity: Yes  Other Topics Concern  . 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 Difficulty of Paying Living Expenses: Not on file  Food Insecurity:   . Worried About Charity fundraiser in the Last Year: Not on file  . Ran Out of Food in the Last Year: Not on file  Transportation Needs:   . Lack of Transportation (Medical): Not on file  . Lack of Transportation (Non-Medical): Not on file  Physical Activity:   . Days of Exercise per Week: Not on file  . Minutes of Exercise per Session: Not on file  Stress:   . Feeling of Stress : Not on file  Social  Connections:   . Frequency of Communication with Friends and Family: Not on file  . Frequency of Social Gatherings with Friends and Family: Not on file  . Attends Religious Services: Not on file  . Active Member of Clubs or Organizations: Not on file  . Attends Archivist Meetings: Not on file  . Marital Status: Not on file  Intimate Partner Violence:   . Fear of Current or Ex-Partner: Not on file  . Emotionally Abused: Not on file  . Physically Abused: Not on file  . Sexually Abused: Not on file   Current Outpatient Medications on File Prior to Visit  Medication Sig Dispense Refill  . acetaZOLAMIDE (DIAMOX) 125 MG tablet TAKE 1 TABLET(125 MG) BY MOUTH DAILY 90 tablet 3  . buPROPion (WELLBUTRIN XL) 150 MG 24 hr tablet Take 450 mg by mouth every morning.     . carboxymethylcellulose (REFRESH PLUS) 0.5 % SOLN Place 1 drop into both eyes 3 (three) times daily as needed (dry eyes).    . carvedilol (COREG) 3.125 MG tablet Take 1 tablet (3.125 mg total) by mouth 2 (two) times daily with a meal. 60 tablet 3  . citalopram (CELEXA) 20 MG tablet Take 20 mg by mouth daily.    . clonazePAM (KLONOPIN) 0.5 MG tablet Take 0.5 mg by mouth 3 (three) times daily as needed for anxiety.     . fexofenadine (ALLEGRA) 180 MG tablet Take 180 mg by mouth 2 (two) times daily.    . fludrocortisone (FLORINEF) 0.1 MG tablet Take 1 tablet (0.1 mg total) by mouth 2 (two) times daily. 60 tablet 3  . gabapentin (NEURONTIN) 300 MG capsule Take 300 mg by mouth 3 (three) times daily.     Marland Kitchen gabapentin (NEURONTIN) 800 MG tablet Take 800 mg by mouth at bedtime as needed (neuropathy).     Marland Kitchen ipratropium (ATROVENT) 0.03 % nasal spray Place 2 sprays into both nostrils 2 (two) times daily.    Marland Kitchen levothyroxine (SYNTHROID) 75 MCG tablet Take 1 tablet (75 mcg total) by mouth daily. 90 tablet 3  . levothyroxine (SYNTHROID) 88 MCG tablet TAKE 1 TABLET(88 MCG)  BY MOUTH DAILY BEFORE BREAKFAST 45 tablet 1  . metolazone (ZAROXOLYN)  2.5 MG tablet Take 1 tablet (2.5 mg total) by mouth daily as needed (edema). Take only as directed by CHF clinic    . midodrine (PROAMATINE) 10 MG tablet Take 1 tablet (10 mg total) by mouth 3 (three) times daily with meals. 90 tablet 3  . mirtazapine (REMERON) 30 MG tablet mirtazapine 30 mg tablet    . montelukast (SINGULAIR) 10 MG tablet Take 10 mg by mouth at bedtime.   1  . Nabumetone (RELAFEN PO) nabumetone    . potassium chloride SA (KLOR-CON) 20 MEQ tablet Take 1 tablet (20 mEq total) by mouth 2 (two) times daily. 60 tablet 11  . probenecid (BENEMID) 500 MG tablet Take 500 mg by mouth 2 (two) times daily.    . simvastatin (ZOCOR) 10 MG tablet Take 10 mg by mouth every evening.     Marland Kitchen spironolactone (ALDACTONE) 25 MG tablet TAKE 1/2 TABLET BY MOUTH EVERY EVENING 45 tablet 1  . torsemide (DEMADEX) 20 MG tablet Take 3 tablets (60 mg total) by mouth 2 (two) times daily. 180 tablet 6  . zolpidem (AMBIEN) 10 MG tablet Take 10 mg by mouth at bedtime as needed for sleep. For sleep      No current facility-administered medications on file prior to visit.   Allergies  Allergen Reactions  . Ceftin Anaphylaxis    Face and throat swell   . Shellfish Allergy Other (See Comments)    Gout exacerbation  . Geodon [Ziprasidone Hcl] Hives  . Lisinopril Other (See Comments)    angioedema  . Allopurinol Nausea Only and Other (See Comments)    weakness  . Ativan [Lorazepam] Itching  . Sulfa Antibiotics Itching  . Ultram [Tramadol Hcl] Itching   Family History  Problem Relation Age of Onset  . Alzheimer's disease Mother   . CVA Mother   . Hypertension Mother   . Cancer - Colon Father        late 27  . Birth defects Paternal Uncle     PE: BP 130/80   Pulse 71   Ht 5\' 1"  (1.549 m)   Wt 180 lb (81.6 kg)   SpO2 98%   BMI 34.01 kg/m  Wt Readings from Last 3 Encounters:  06/14/19 180 lb (81.6 kg)  06/07/19 175 lb 9.6 oz (79.7 kg)  05/25/19 168 lb (76.2 kg)   Constitutional: overweight, in  NAD Eyes: PERRLA, EOMI, no exophthalmos ENT: moist mucous membranes, no thyromegaly, no cervical lymphadenopathy Cardiovascular: RRR, No MRG Respiratory: CTA B Gastrointestinal: abdomen soft, NT, ND, BS+ Musculoskeletal: no deformities, strength intact in all 4 Skin: moist, warm, no rashes Neurological: no tremor with outstretched hands, DTR normal in all 4  ASSESSMENT: 1. Hypothyroidism  2. Eucalcemic hyperparathyroidism  PLAN:  1. Patient with longstanding hypothyroidism, on levothyroxine therapy.  In the past, she was not taking levothyroxine correctly, but she is doing so now.  We discussed at our first visit about taking the levothyroxine by itself in the morning and separating multivitamins and colestipol by at least 4 hours.  At last visit, she was telling me that she was on colestipol. - latest thyroid labs reviewed with pt >> slightly low at last check, after which we decreased the dose of her levothyroxine.  Unfortunately, she did not return for labs afterwards: Lab Results  Component Value Date   TSH 0.21 (L) 02/22/2019   - she continues on LT4 88 mcg daily -  pt feels good on this dose. - we discussed about taking the thyroid hormone every day, with water, >30 minutes before breakfast, separated by >4 hours from acid reflux medications, calcium, iron, multivitamins. Pt. is taking it correctly. - will check thyroid tests at next lab draw: TSH and fT4 - If labs are abnormal, she will need to return for repeat TFTs in 1.5 months -I will see her in a year afterwards, but with more frequent labs  2. Eucalcemic hyperparathyroidism -We investigated her for her hyperparathyroidism.  Her vitamin D level is normal, calcium was low normal, and PTH is high.  She also has CKD and we discussed in previous visits about seeing a nephrologist as her hyperparathyroidism appears to be secondary to her CKD, and not primary. She did not see nephrology yet. -Latest calcium level was reviewed and  this was slightly low earlier this month -Last PTH was within the normal range  Philemon Kingdom, MD PhD [facility] Endocrinology

## 2019-06-14 NOTE — Telephone Encounter (Signed)
Pt called to report her wt is up 6 lbs in past 2 days and she feels more SOB.  She states she has been eating recently including a lot of Cook-out milkshakes, donuts and cakes.  We discussed importance of diet and she states she is going to try hard to do better.  She is not sure about her meds as Karena Addison, paramedic, fixes her pill boxes for her but she states she does take what's in the box.  Per chart pt is taking Torsemide 60 mg BID.  Discussed info w/Amy Ninfa Meeker, NP she advises pt can increase Torsemide to 80 mg BID x 2 days along w/extra 20 meq of KCL.  Karena Addison is on vacation so called and spoke w/Zack, paramedic, he is aware of changes that need to be made to pill box and will go and see pt today.   Pt is aware and agreeable.

## 2019-06-14 NOTE — Patient Instructions (Addendum)
Please continue levothyroxine 88 mcg daily.  Take the thyroid hormone every day, with water, at least 30 minutes before breakfast, separated by at least 4 hours from: - acid reflux medications - calcium - iron - multivitamins  Please come back for labs  Please return to see me in 1 year.

## 2019-06-15 DIAGNOSIS — E119 Type 2 diabetes mellitus without complications: Secondary | ICD-10-CM

## 2019-06-15 DIAGNOSIS — Z01419 Encounter for gynecological examination (general) (routine) without abnormal findings: Secondary | ICD-10-CM | POA: Diagnosis not present

## 2019-06-15 DIAGNOSIS — N958 Other specified menopausal and perimenopausal disorders: Secondary | ICD-10-CM | POA: Diagnosis not present

## 2019-06-15 DIAGNOSIS — E1140 Type 2 diabetes mellitus with diabetic neuropathy, unspecified: Secondary | ICD-10-CM | POA: Insufficient documentation

## 2019-06-15 DIAGNOSIS — Z6833 Body mass index (BMI) 33.0-33.9, adult: Secondary | ICD-10-CM | POA: Diagnosis not present

## 2019-06-15 HISTORY — DX: Type 2 diabetes mellitus without complications: E11.9

## 2019-06-21 ENCOUNTER — Other Ambulatory Visit: Payer: Medicare HMO

## 2019-06-21 DIAGNOSIS — F3342 Major depressive disorder, recurrent, in full remission: Secondary | ICD-10-CM | POA: Diagnosis not present

## 2019-06-23 DIAGNOSIS — R197 Diarrhea, unspecified: Secondary | ICD-10-CM | POA: Diagnosis not present

## 2019-06-23 DIAGNOSIS — R159 Full incontinence of feces: Secondary | ICD-10-CM | POA: Diagnosis not present

## 2019-06-27 ENCOUNTER — Other Ambulatory Visit: Payer: Self-pay

## 2019-06-29 ENCOUNTER — Other Ambulatory Visit (INDEPENDENT_AMBULATORY_CARE_PROVIDER_SITE_OTHER): Payer: Medicare HMO

## 2019-06-29 ENCOUNTER — Other Ambulatory Visit: Payer: Self-pay

## 2019-06-29 DIAGNOSIS — E213 Hyperparathyroidism, unspecified: Secondary | ICD-10-CM

## 2019-06-29 DIAGNOSIS — E039 Hypothyroidism, unspecified: Secondary | ICD-10-CM

## 2019-06-29 LAB — T4, FREE: Free T4: 0.79 ng/dL (ref 0.60–1.60)

## 2019-06-29 LAB — TSH: TSH: 0.03 u[IU]/mL — ABNORMAL LOW (ref 0.35–4.50)

## 2019-07-03 DIAGNOSIS — M158 Other polyosteoarthritis: Secondary | ICD-10-CM | POA: Diagnosis not present

## 2019-07-04 ENCOUNTER — Telehealth (HOSPITAL_COMMUNITY): Payer: Self-pay

## 2019-07-04 NOTE — Telephone Encounter (Signed)
Spoke to Mrs. Hoskinson who stated she has her medications for this week but will need to be seen next week on Tuesday, will confirm visit time next week. Call complete.

## 2019-07-10 ENCOUNTER — Ambulatory Visit (INDEPENDENT_AMBULATORY_CARE_PROVIDER_SITE_OTHER): Payer: Medicare HMO

## 2019-07-10 DIAGNOSIS — Z95810 Presence of automatic (implantable) cardiac defibrillator: Secondary | ICD-10-CM | POA: Diagnosis not present

## 2019-07-10 DIAGNOSIS — I5022 Chronic systolic (congestive) heart failure: Secondary | ICD-10-CM

## 2019-07-11 ENCOUNTER — Other Ambulatory Visit (HOSPITAL_COMMUNITY): Payer: Self-pay

## 2019-07-11 ENCOUNTER — Other Ambulatory Visit: Payer: Self-pay | Admitting: Neurology

## 2019-07-11 DIAGNOSIS — S76111A Strain of right quadriceps muscle, fascia and tendon, initial encounter: Secondary | ICD-10-CM | POA: Diagnosis not present

## 2019-07-11 NOTE — Progress Notes (Signed)
Paramedicine Encounter    Patient ID: Rhonda [name], female    DOB: 06-24-1950, 69 y.o.   MRN: [id number]   Patient Care Team: Maurice Small, MD as PCP - General (Family Medicine) [name], LCSW as Social Worker (Licensed Clinical Social Worker) [name], [name], DO as Consulting Physician (Neurology)  Patient Active Problem List   Diagnosis Date Noted  . Hypokalemia 11/08/2018  . CKD (chronic kidney disease) stage 3, GFR 30-59 ml/min 11/08/2018  . Orthostatic hypotension 07/28/2017  . SVD (spontaneous vaginal delivery)   . Peripheral neuropathy   . On home oxygen therapy   . Migraines   . Left bundle branch block   . Hypothyroidism   . Hypertension   . Hyperlipidemia   . Heart murmur   . GERD (gastroesophageal reflux disease)   . Exertional shortness of breath   . Major depressive disorder   . Back pain   . Arthritis   . Generalized anxiety disorder   . Anemia   . Chronic respiratory failure ([place]) 09/14/2013  . 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 Morbid obesity ([place]) 11/01/2012  . Chronic systolic heart failure ([place]) 10/27/2012  . Endometrial polyp 01/20/2012  . Unspecified vitamin D deficiency 03/26/2011    Current Outpatient Medications:  .  acetaZOLAMIDE (DIAMOX) 125 MG tablet, TAKE 1 TABLET(125 MG) BY MOUTH DAILY, Disp: 90 tablet, Rfl: 3 .  buPROPion (WELLBUTRIN XL) 150 MG 24 hr tablet, Take 450 mg by mouth every morning. , Disp: , Rfl:  .  carboxymethylcellulose (REFRESH PLUS) 0.5 % SOLN, Place 1 drop into both eyes 3 (three) times daily as needed (dry eyes)., Disp: , Rfl:  .  carvedilol (COREG) 3.125 MG tablet, Take 1 tablet (3.125 mg total) by mouth 2 (two) times daily with a meal., Disp: 60 tablet, Rfl: 3 .  citalopram (CELEXA) 20 MG tablet, Take 20 mg by mouth daily., Disp: , Rfl:  .  fexofenadine (ALLEGRA) 180 MG tablet, Take 180 mg by mouth 2 (two) times daily., Disp: , Rfl:  .  fludrocortisone (FLORINEF) 0.1 MG  tablet, Take 1 tablet (0.1 mg total) by mouth 2 (two) times daily., Disp: 60 tablet, Rfl: 3 .  gabapentin (NEURONTIN) 300 MG capsule, Take 300 mg by mouth 3 (three) times daily. , Disp: , Rfl:  .  ipratropium (ATROVENT) 0.03 % nasal spray, Place 2 sprays into both nostrils 2 (two) times daily., Disp: , Rfl:  .  levothyroxine (SYNTHROID) 88 MCG tablet, TAKE 1 TABLET(88 MCG) BY MOUTH DAILY BEFORE BREAKFAST, Disp: 45 tablet, Rfl: 1 .  midodrine (PROAMATINE) 10 MG tablet, Take 1 tablet (10 mg total) by mouth 3 (three) times daily with meals., Disp: 90 tablet, Rfl: 3 .  mirtazapine (REMERON) 30 MG tablet, mirtazapine 30 mg tablet, Disp: , Rfl:  .  montelukast (SINGULAIR) 10 MG tablet, Take 10 mg by mouth at bedtime. , Disp: , Rfl: 1 .  potassium chloride SA (KLOR-CON) 20 MEQ tablet, Take 1 tablet (20 mEq total) by mouth 2 (two) times daily., Disp: 60 tablet, Rfl: 11 .  probenecid (BENEMID) 500 MG tablet, Take 500 mg by mouth 2 (two) times daily., Disp: , Rfl:  .  simvastatin (ZOCOR) 10 MG tablet, Take 10 mg by mouth every evening. , Disp: , Rfl:  .  spironolactone (ALDACTONE) 25 MG tablet, TAKE 1/2 TABLET BY MOUTH EVERY EVENING, Disp: 45 tablet, Rfl: 1 .  torsemide (DEMADEX) 20 MG tablet, Take 3 tablets (60 mg total) by mouth  2 (two) times daily., Disp: 180 tablet, Rfl: 6 .  zolpidem (AMBIEN) 10 MG tablet, Take 10 mg by mouth at bedtime as needed for sleep. For sleep , Disp: , Rfl:  .  clonazePAM (KLONOPIN) 0.5 MG tablet, Take 0.5 mg by mouth 3 (three) times daily as needed for anxiety. , Disp: , Rfl:  .  gabapentin (NEURONTIN) 800 MG tablet, Take 800 mg by mouth at bedtime as needed (neuropathy). , Disp: , Rfl:  .  metolazone (ZAROXOLYN) 2.5 MG tablet, Take 1 tablet (2.5 mg total) by mouth daily as needed (edema). Take only as directed by CHF clinic (Patient not taking: Reported on 07/11/2019), Disp: , Rfl:  .  Nabumetone (RELAFEN PO), nabumetone, Disp: , Rfl:  Allergies  Allergen Reactions  . Ceftin  Anaphylaxis    Face and throat swell   . Shellfish Allergy Other (See Comments)    Gout exacerbation  . Geodon [Ziprasidone Hcl] Hives  . Lisinopril Other (See Comments)    angioedema  . Allopurinol Nausea Only and Other (See Comments)    weakness  . Ativan [Lorazepam] Itching  . Sulfa Antibiotics Itching  . Ultram [Tramadol Hcl] Itching     Social History   Socioeconomic History  . Marital status: Married    Spouse name: Not on file  . Number of children: 2  . Years of education: 36  . Highest education level: Master's degree (e.g., MA, MS, MEng, MEd, MSW, MBA)  Occupational History  . Occupation: retired  Tobacco Use  . Smoking status: Former Smoker    Packs/day: 0.10    Years: 26.00    Pack years: 2.60    Types: Cigarettes    Quit date: 05/06/2007    Years since quitting: 12.1  . Smokeless tobacco: Never Used  Substance and Sexual Activity  . Alcohol use: No  . Drug use: No  . Sexual activity: Yes  Other Topics Concern  . 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 Difficulty of Paying Living Expenses: Not on file  Food Insecurity:   . Worried About Charity fundraiser in the Last Year: Not on file  . Ran Out of Food in the Last Year: Not on file  Transportation Needs:   . Lack of Transportation (Medical): Not on file  . Lack of Transportation (Non-Medical): Not on file  Physical Activity:   . Days of Exercise per Week: Not on file  . Minutes of Exercise per Session: Not on file  Stress:   . Feeling of Stress : Not on file  Social Connections:   . Frequency of Communication with  Friends and Family: Not on file  . Frequency of Social Gatherings with Friends and Family: Not on file  . Attends Religious Services: Not on file  . Active Member of Clubs or Organizations: Not on file  . Attends Archivist Meetings: Not on file  . Marital Status: Not on file  Intimate Partner Violence:   . Fear of Current or Ex-Partner:  Not on file  . Emotionally Abused: Not on file  . Physically Abused: Not on file  . Sexually Abused: Not on file    Physical Exam Vitals reviewed.  HENT:     Head: Normocephalic.     Nose: Nose normal.     Mouth/Throat:     Mouth: Mucous membranes are moist.  Eyes:     Pupils: Pupils are equal, round, and reactive to light.  Cardiovascular:     Rate and Rhythm: Normal rate and regular rhythm.     Pulses: Normal pulses.  Pulmonary:     Effort: Pulmonary effort is normal.  Abdominal:     Palpations: Abdomen is soft.  Musculoskeletal:        General: Normal range of motion.     Cervical back: Normal range of motion.     Right lower leg: Edema present.     Left lower leg: Edema present.     Comments: Left hip pain radiating into left hamstring    Skin:    General: Skin is warm and dry.     Capillary Refill: Capillary refill takes less than 2 seconds.  Neurological:     Mental Status: She is alert. Mental status is at baseline.  Psychiatric:        Mood and Affect: Mood normal.     Arrived for home visit for Rhonda [name] who was alert and oriented stating she was having left hip pain and left leg pain which her PCP is aware of and gave her tramadol and advised her to take [employer] Back and Body. Patient reports this has not helped and she contacted her PCP while I was there for a follow up appointment with same. Vitals were assessed and are as noted. Medications were reviewed and confirmed. Pill boxes were filled. Rhonda [name] agreed to a visit in two weeks. Multiple medications noted for refill. Home visit complete.   Meds for  refill: Zolpidiem Mirtipizine Midodrine Allegra  Wellbutrin Fludrocortisone Acetazolamide     Future Appointments  Date Time Provider [place]  07/19/2019  7:30 AM [facility] DEVICE REMOTES [facility]  08/10/2019  9:50 AM Narda Amber K, DO [facility] None  10/18/2019  7:30 AM [facility] DEVICE REMOTES [facility]  01/17/2020  7:30 AM [facility] DEVICE REMOTES [facility]  04/17/2020  7:30 AM [facility] DEVICE REMOTES [facility]  06/12/2020 10:00 AM Philemon Kingdom, MD [facility] None     ACTION: Home visit completed Next visit planned for 2 weeks

## 2019-07-14 ENCOUNTER — Other Ambulatory Visit (HOSPITAL_COMMUNITY): Payer: Self-pay

## 2019-07-14 NOTE — Progress Notes (Signed)
Ms Perrault was seen at home today and reported feeling stressed but physically well. She was seen at her PCP office's walk-in clinic earlier in the week due to leg pain and was diagnosed with a quadricept strain. The physician she saw advised her to take OTC pain medication but to avoid aspirin due to her decreased renal function. I explained to her that the decreased kidney function was not a new thing and the HF clinic monitors her kidney function regularly. She went on to express concern over how her pills were arranged in her pillbox because she was used to them being different. She stated she did not want to take any diuretics in her evening medications so I moved them to her afternoon slot. I will notify Jeris Penta of this so she can make this adjustment moving forward.   Jacquiline Doe, EMT 07/14/19

## 2019-07-14 NOTE — Progress Notes (Signed)
EPIC Encounter for ICM Monitoring  Patient Name: Rhonda [name] is a 69 y.o. female Date: 07/14/2019 Primary Care Physican: Maurice Small, MD Primary Cardiologist:Bensimhon Electrophysiologist: Santina Evans Pacing:99.9% 07/14/2019 Weight: 180lbs   Spoke with patient and reports leg pain (knee to groin on left side) and has discussed with PCP.    She will call Dr Bensimhon's office if she feels like she has any fluid symptoms.  [facility] thoracic impedancecontinues to suggestpossibleongoingfluid accumulation since February 2020.  Prescribed: Patient participating in paramedicine program to assist with meds.  Torsemide 20 mg3tablets (60 mg total)by mouthtwice a day.  Potassium 20 meq1tablets (20 mEq total)twice a day.   Metolazone 2.5mg  1 tablet by mouth if needed for fluid or edema.  Labs: 06/07/2019 Creatinine 1.57, BUN 22, Potassium 3.6, Sodium 142, GFR 34-39 03/30/2019 Creatinine 1.68, BUN 24, Potassium 3.6, Sodium 139, GFR 31-36 01/11/2019 Creatinine 1.84, BUN 35, Potassium 4.2, Sodium 140, GFR 28-33 A complete set of results can be found in Results Review.  Recommendations: No changes and encouraged to call for worsening conditions or call 911 if needed.  Follow-up plan: ICM clinic phone appointment on3/22/2021. 91 day device clinic remote transmission 07/19/2019.   Copy of ICM check sent to Dr.Taylor.   3 month ICM trend: 07/10/2019    1 Year ICM trend:       [name], RN 07/14/2019 9:56 AM

## 2019-07-19 ENCOUNTER — Ambulatory Visit (INDEPENDENT_AMBULATORY_CARE_PROVIDER_SITE_OTHER): Payer: Medicare HMO | Admitting: *Deleted

## 2019-07-19 ENCOUNTER — Telehealth (HOSPITAL_COMMUNITY): Payer: Self-pay | Admitting: Licensed Clinical Social Worker

## 2019-07-19 ENCOUNTER — Other Ambulatory Visit (HOSPITAL_COMMUNITY): Payer: Self-pay

## 2019-07-19 DIAGNOSIS — Z95810 Presence of automatic (implantable) cardiac defibrillator: Secondary | ICD-10-CM

## 2019-07-19 LAB — CUP PACEART REMOTE DEVICE CHECK
Battery Remaining Longevity: 9 mo
Battery Voltage: 2.85 V
Brady Statistic AP VP Percent: 0.68 %
Brady Statistic AP VS Percent: 0.01 %
Brady Statistic AS VP Percent: 99.28 %
Brady Statistic AS VS Percent: 0.03 %
Brady Statistic RA Percent Paced: 0.69 %
Brady Statistic RV Percent Paced: 99.94 %
Date Time Interrogation Session: 20210224063625
HighPow Impedance: 46 Ohm
Implantable Lead Implant Date: 20141210
Implantable Lead Implant Date: 20141210
Implantable Lead Implant Date: 20141210
Implantable Lead Location: 753858
Implantable Lead Location: 753859
Implantable Lead Location: 753860
Implantable Lead Model: 4396
Implantable Lead Model: 5076
Implantable Lead Model: 6935
Implantable Pulse Generator Implant Date: 20141210
Lead Channel Impedance Value: 323 Ohm
Lead Channel Impedance Value: 323 Ohm
Lead Channel Impedance Value: 456 Ohm
Lead Channel Impedance Value: 513 Ohm
Lead Channel Impedance Value: 532 Ohm
Lead Channel Impedance Value: 589 Ohm
Lead Channel Pacing Threshold Amplitude: 0.5 V
Lead Channel Pacing Threshold Amplitude: 0.875 V
Lead Channel Pacing Threshold Amplitude: 1 V
Lead Channel Pacing Threshold Pulse Width: 0.4 ms
Lead Channel Pacing Threshold Pulse Width: 0.4 ms
Lead Channel Pacing Threshold Pulse Width: 0.4 ms
Lead Channel Sensing Intrinsic Amplitude: 2 mV
Lead Channel Sensing Intrinsic Amplitude: 2 mV
Lead Channel Sensing Intrinsic Amplitude: 22 mV
Lead Channel Sensing Intrinsic Amplitude: 22 mV
Lead Channel Setting Pacing Amplitude: 2 V
Lead Channel Setting Pacing Amplitude: 2.25 V
Lead Channel Setting Pacing Amplitude: 2.5 V
Lead Channel Setting Pacing Pulse Width: 0.4 ms
Lead Channel Setting Pacing Pulse Width: 0.4 ms
Lead Channel Setting Sensing Sensitivity: 0.3 mV

## 2019-07-19 NOTE — Telephone Encounter (Signed)
CSW called pt to discuss Extra Help program to assist with insurance premium costs and medication costs and assess if pt is eligible.  Pt reports she believes already has Extra Help program and that she never pays more than $10 for medications.  CSW then received a call later in the day from the [employer] Paramedic requesting help getting patient on the pill pack program through [facility].  CSW called [facility] and confirmed her insurance is in network so should have no additional cost for the service- CSW provided them with pt med list and they will follow up directly with pt to complete intake- pt informed.  CSW will continue to follow through [employer] and assist as needed,  [name], LCSW Clinical Social Worker [facility] Desk#: [phone] Cell#: [phone]

## 2019-07-19 NOTE — Progress Notes (Signed)
Came to see patient today after receiving call from [place] with [facility] stating patient's pill box was astray and patient admitted to trying to "fix" it. I removed all pills and confirmed all medications and re-filled one pill box. I spoke to [place] patient's RN with [facility] who agreed upon patient getting placed on pre-filled dispensary program with [facility] with [facility]. I spoke to [place] who agreed to be in contact with [facility] to set this up. I advised I can assist with confirming medications.    Larena Glassman (remote health) [phone]   Weight today- 181lbs     Needs these for pill box: Wellbutrin: Monday, Tuesday Wednesday  Ambien: Sunday, Monday, Tuesday PM    Refills:  Wellbutrin Ambien

## 2019-07-20 NOTE — Progress Notes (Signed)
ICD Remote  

## 2019-07-25 DIAGNOSIS — M48062 Spinal stenosis, lumbar region with neurogenic claudication: Secondary | ICD-10-CM | POA: Diagnosis not present

## 2019-07-26 ENCOUNTER — Other Ambulatory Visit (HOSPITAL_COMMUNITY): Payer: Self-pay | Admitting: *Deleted

## 2019-07-26 ENCOUNTER — Telehealth (HOSPITAL_COMMUNITY): Payer: Self-pay

## 2019-07-26 ENCOUNTER — Other Ambulatory Visit (HOSPITAL_COMMUNITY): Payer: Self-pay

## 2019-07-26 DIAGNOSIS — R159 Full incontinence of feces: Secondary | ICD-10-CM | POA: Diagnosis not present

## 2019-07-26 DIAGNOSIS — Z86010 Personal history of colonic polyps: Secondary | ICD-10-CM | POA: Diagnosis not present

## 2019-07-26 DIAGNOSIS — K639 Disease of intestine, unspecified: Secondary | ICD-10-CM | POA: Diagnosis not present

## 2019-07-26 DIAGNOSIS — K8020 Calculus of gallbladder without cholecystitis without obstruction: Secondary | ICD-10-CM | POA: Diagnosis not present

## 2019-07-26 MED ORDER — METOLAZONE 2.5 MG PO TABS: ORAL_TABLET | ORAL | 0 refills | Status: DC

## 2019-07-26 NOTE — Progress Notes (Signed)
Paramedicine Encounter    Patient ID: Rhonda [name], female    DOB: [birth year], 69 y.o.   MRN: [id number]   Patient Care Team: Maurice Small, MD as PCP - General (Family Medicine) [name], LCSW as Social Worker (Licensed Clinical Social Worker) [name], [name], DO as Consulting Physician (Neurology)  Patient Active Problem List   Diagnosis Date Noted  . Hypokalemia 11/08/2018  . CKD (chronic kidney disease) stage 3, GFR 30-59 ml/min 11/08/2018  . Orthostatic hypotension 07/28/2017  . SVD (spontaneous vaginal delivery)   . Peripheral neuropathy   . On home oxygen therapy   . Migraines   . Left bundle branch block   . Hypothyroidism   . Hypertension   . Hyperlipidemia   . Heart murmur   . GERD (gastroesophageal reflux disease)   . Exertional shortness of breath   . Major depressive disorder   . Back pain   . Arthritis   . Generalized anxiety disorder   . Anemia   . Chronic respiratory failure ([place]) 09/14/2013  . 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 Morbid obesity ([place]) 11/01/2012  . Chronic systolic heart failure ([place]) 10/27/2012  . Endometrial polyp 01/20/2012  . Unspecified vitamin D deficiency 03/26/2011    Current Outpatient Medications:  .  acetaZOLAMIDE (DIAMOX) 125 MG tablet, TAKE 1 TABLET(125 MG) BY MOUTH DAILY, Disp: 90 tablet, Rfl: 3 .  buPROPion (WELLBUTRIN XL) 150 MG 24 hr tablet, Take 450 mg by mouth every morning. , Disp: , Rfl:  .  carboxymethylcellulose (REFRESH PLUS) 0.5 % SOLN, Place 1 drop into both eyes 3 (three) times daily as needed (dry eyes)., Disp: , Rfl:  .  carvedilol (COREG) 3.125 MG tablet, Take 1 tablet (3.125 mg total) by mouth 2 (two) times daily with a meal., Disp: 60 tablet, Rfl: 3 .  citalopram (CELEXA) 20 MG tablet, Take 20 mg by mouth daily., Disp: , Rfl:  .  clonazePAM (KLONOPIN) 0.5 MG tablet, Take 0.5 mg by mouth 3 (three) times daily as needed for anxiety. , Disp: , Rfl:  .   fexofenadine (ALLEGRA) 180 MG tablet, Take 180 mg by mouth 2 (two) times daily., Disp: , Rfl:  .  fludrocortisone (FLORINEF) 0.1 MG tablet, TAKE 1 TABLET(0.1 MG) BY MOUTH TWICE DAILY, Disp: 60 tablet, Rfl: 3 .  gabapentin (NEURONTIN) 300 MG capsule, Take 300 mg by mouth 3 (three) times daily. , Disp: , Rfl:  .  gabapentin (NEURONTIN) 800 MG tablet, Take 800 mg by mouth at bedtime as needed (neuropathy). , Disp: , Rfl:  .  ipratropium (ATROVENT) 0.03 % nasal spray, Place 2 sprays into both nostrils 2 (two) times daily., Disp: , Rfl:  .  levothyroxine (SYNTHROID) 88 MCG tablet, TAKE 1 TABLET(88 MCG) BY MOUTH DAILY BEFORE BREAKFAST, Disp: 45 tablet, Rfl: 1 .  metolazone (ZAROXOLYN) 2.5 MG tablet, Take 1 tablet (2.5 mg total) by mouth daily as needed (edema). Take only as directed by CHF clinic (Patient not taking: Reported on 07/11/2019), Disp: , Rfl:  .  midodrine (PROAMATINE) 10 MG tablet, Take 1 tablet (10 mg total) by mouth 3 (three) times daily with meals., Disp: 90 tablet, Rfl: 3 .  mirtazapine (REMERON) 30 MG tablet, mirtazapine 30 mg tablet, Disp: , Rfl:  .  montelukast (SINGULAIR) 10 MG tablet, Take 10 mg by mouth at bedtime. , Disp: , Rfl: 1 .  Nabumetone (RELAFEN PO), nabumetone, Disp: , Rfl:  .  potassium chloride SA (KLOR-CON) 20 MEQ tablet, Take  1 tablet (20 mEq total) by mouth 2 (two) times daily., Disp: 60 tablet, Rfl: 11 .  probenecid (BENEMID) 500 MG tablet, Take 500 mg by mouth 2 (two) times daily., Disp: , Rfl:  .  simvastatin (ZOCOR) 10 MG tablet, Take 10 mg by mouth every evening. , Disp: , Rfl:  .  spironolactone (ALDACTONE) 25 MG tablet, TAKE 1/2 TABLET BY MOUTH EVERY EVENING, Disp: 45 tablet, Rfl: 1 .  torsemide (DEMADEX) 20 MG tablet, Take 3 tablets (60 mg total) by mouth 2 (two) times daily., Disp: 180 tablet, Rfl: 6 .  zolpidem (AMBIEN) 10 MG tablet, Take 10 mg by mouth at bedtime as needed for sleep. For sleep , Disp: , Rfl:  Allergies  Allergen Reactions  . Ceftin  Anaphylaxis    Face and throat swell   . Shellfish Allergy Other (See Comments)    Gout exacerbation  . Geodon [Ziprasidone Hcl] Hives  . Lisinopril Other (See Comments)    angioedema  . Allopurinol Nausea Only and Other (See Comments)    weakness  . Ativan [Lorazepam] Itching  . Sulfa Antibiotics Itching  . Ultram [Tramadol Hcl] Itching     Social History   Socioeconomic History  . Marital status: Married    Spouse name: Not on file  . Number of children: 2  . Years of education: 70  . Highest education level: Master's degree (e.g., MA, MS, MEng, MEd, MSW, MBA)  Occupational History  . Occupation: retired  Tobacco Use  . Smoking status: Former Smoker    Packs/day: 0.10    Years: 26.00    Pack years: 2.60    Types: Cigarettes    Quit date: 05/06/2007    Years since quitting: 12.2  . Smokeless tobacco: Never Used  Substance and Sexual Activity  . Alcohol use: No  . Drug use: No  . Sexual activity: Yes  Other Topics Concern  . 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 Difficulty of Paying Living Expenses: Not on file  Food Insecurity:   . Worried About Charity fundraiser in the Last Year: Not on file  . Ran Out of Food in the Last Year: Not on file  Transportation Needs:   . Lack of Transportation (Medical): Not on file  . Lack of Transportation (Non-Medical): Not on file  Physical Activity:   . Days of Exercise per Week: Not on file  . Minutes of Exercise per Session: Not on file  Stress:   . Feeling of Stress : Not on file  Social Connections:   . Frequency of Communication with  Friends and Family: Not on file  . Frequency of Social Gatherings with Friends and Family: Not on file  . Attends Religious Services: Not on file  . Active Member of Clubs or Organizations: Not on file  . Attends Archivist Meetings: Not on file  . Marital Status: Not on file  Intimate Partner Violence:   . Fear of Current or Ex-Partner: Not on file  .  Emotionally Abused: Not on file  . Physically Abused: Not on file  . Sexually Abused: Not on file    Physical Exam Vitals reviewed.  HENT:     Head: Normocephalic.     Nose: Nose normal.     Mouth/Throat:     Mouth: Mucous membranes are moist.  Eyes:     Pupils: Pupils are equal, round, and reactive to light.  Cardiovascular:     Rate and Rhythm: Normal rate.     Pulses: Normal pulses.  Pulmonary:     Effort: Pulmonary effort is normal.     Breath sounds: Normal breath sounds.  Abdominal:     Palpations: Abdomen is soft.  Musculoskeletal:        General: Tenderness present. Normal range of motion.     Cervical back: Normal range of motion.     Right lower leg: Edema present.     Left lower leg: Edema present.     Comments: Bilateral leg tenderness and swelling with edema.   Skin:    General: Skin is warm and dry.     Capillary Refill: Capillary refill takes less than 2 seconds.  Neurological:     Mental Status: She is alert. Mental status is at baseline.  Psychiatric:        Mood and Affect: Mood normal.      Arrived for home visit. Rhonda [name] reports feeling okay today but having leg swelling and tenderness with a 6lb weight gain since last week. Edema noted to both lower legs. Vitals as noted. I contacted HF clinic and [place] suggested one Metolazone today and follow up with home health tomorrow. Patient did not have metolazone at home so I contacted [facility] to fill same. Her husband stated he would pick up this afternoon and have her take it once he picked it up.  I contacted home health RN and left message  with her about same. Medications reviewed and confirmed. Pill box filled accordingly. Patient agreed to ome visit one week from today. I will contact patient tomorrow to check on weight information. Home visit complete.   Weight today- 185.6lbs  Weight yesterday- 187lbs  Weight last week- 180lbs   Refills: Metolazone  Ambien (contacted Dr. Casimiro Needle) Wellbutrin (contacted (Dr. Casimiro Needle)      Future Appointments  Date Time Provider [place]  08/10/2019  9:50 AM Narda Amber K, DO [facility] None  08/14/2019  9:50 AM [facility] DEVICE REMOTES [facility]  10/18/2019  7:30 AM [facility] DEVICE REMOTES [facility]  01/17/2020  7:30 AM [facility] DEVICE REMOTES [facility]  04/17/2020  7:30 AM [facility] DEVICE REMOTES [facility]  06/12/2020 10:00 AM Philemon Kingdom, MD [facility] None     ACTION: Home visit completed Next visit planned for one week

## 2019-07-26 NOTE — Telephone Encounter (Signed)
Spoke to Dr. Casimiro Needle office regarding medications and confirmed patient was taken off  -Ambien -Wellbutrin   Patient is current with their office taking: -Cymbalta  -Clonopin   Same related to patient.

## 2019-07-26 NOTE — Telephone Encounter (Signed)
[name] emt-p called regarding pt's increase in weight. Pt up 6lbs in 1 week with increase in swelling in both lower extremities. Per pt prescribed medications advised pt to take 1 tab of metolazone. [facility] remote nurse to come out to home tomorrow instead of today.

## 2019-07-28 ENCOUNTER — Other Ambulatory Visit (HOSPITAL_COMMUNITY): Payer: Self-pay | Admitting: Adult Health

## 2019-07-28 ENCOUNTER — Telehealth (HOSPITAL_COMMUNITY): Payer: Self-pay

## 2019-07-28 NOTE — Telephone Encounter (Signed)
PT contacted [facility] to inquire about the order in which she was to take her medication, as her leg is swollen and she can't remember the order in which she is supposed to take the medication. She stated that no matter when she calls she cant seem to get a return call.  I placed her on hold while I attempted to reach someone to speak with her and I reached out to [facility] with Paramedicine and she was willing to reach out to Ms. Kusch within the hour to further discuss with her.  During the time I was reaching out, the call with Ms. Coggeshall was disconnected.  I attempted to call her back but received her voicemail so I left a message stating that [name] would call her within the hour.

## 2019-07-28 NOTE — Telephone Encounter (Signed)
Patient called asking how she should take her metolazone pill. I asked her did she take the pill on 3/5 when she talked to [place], RN and she said no. I asked if she took it 3/6 she said no so I instructed her to only take 1 tablet po today and do not take any more unless we advise her to. I also advised her to monitor her swelling and if it increases or if the wounds on her legs begin to drain or look infected that she needs to be seen in the ER. Patient verbalized understanding.

## 2019-07-31 ENCOUNTER — Telehealth (HOSPITAL_COMMUNITY): Payer: Self-pay | Admitting: Cardiology

## 2019-07-31 NOTE — Telephone Encounter (Signed)
Patient left voicemail with medication questions  Attempted to return call No answer on home number Left message on cell number

## 2019-08-01 DIAGNOSIS — M7989 Other specified soft tissue disorders: Secondary | ICD-10-CM | POA: Diagnosis not present

## 2019-08-01 DIAGNOSIS — R29898 Other symptoms and signs involving the musculoskeletal system: Secondary | ICD-10-CM

## 2019-08-01 HISTORY — DX: Other symptoms and signs involving the musculoskeletal system: R29.898

## 2019-08-02 ENCOUNTER — Other Ambulatory Visit (HOSPITAL_COMMUNITY): Payer: Self-pay

## 2019-08-02 ENCOUNTER — Other Ambulatory Visit: Payer: Self-pay | Admitting: Neurosurgery

## 2019-08-02 DIAGNOSIS — R29898 Other symptoms and signs involving the musculoskeletal system: Secondary | ICD-10-CM

## 2019-08-02 NOTE — Telephone Encounter (Signed)
Patient left another message on triage line with concerns of CP and leg swelling  Attempted to return call to further assess  Left message on home number and cell phone

## 2019-08-02 NOTE — Progress Notes (Signed)
Paramedicine Encounter    Patient ID: Rhonda [name], female    DOB: [birth year], 69 y.o.   MRN: [id number]   Patient Care Team: Maurice Small, MD as PCP - General (Family Medicine) [name], LCSW as Social Worker (Licensed Clinical Social Worker) [name], [name], DO as Consulting Physician (Neurology)  Patient Active Problem List   Diagnosis Date Noted  . Hypokalemia 11/08/2018  . CKD (chronic kidney disease) stage 3, GFR 30-59 ml/min 11/08/2018  . Orthostatic hypotension 07/28/2017  . SVD (spontaneous vaginal delivery)   . Peripheral neuropathy   . On home oxygen therapy   . Migraines   . Left bundle branch block   . Hypothyroidism   . Hypertension   . Hyperlipidemia   . Heart murmur   . GERD (gastroesophageal reflux disease)   . Exertional shortness of breath   . Major depressive disorder   . Back pain   . Arthritis   . Generalized anxiety disorder   . Anemia   . Chronic respiratory failure ([place]) 09/14/2013  . 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 Morbid obesity ([place]) 11/01/2012  . Chronic systolic heart failure ([place]) 10/27/2012  . Endometrial polyp 01/20/2012  . Unspecified vitamin D deficiency 03/26/2011    Current Outpatient Medications:  .  acetaZOLAMIDE (DIAMOX) 125 MG tablet, TAKE 1 TABLET(125 MG) BY MOUTH DAILY, Disp: 90 tablet, Rfl: 3 .  carboxymethylcellulose (REFRESH PLUS) 0.5 % SOLN, Place 1 drop into both eyes 3 (three) times daily as needed (dry eyes)., Disp: , Rfl:  .  carvedilol (COREG) 3.125 MG tablet, TAKE 1 TABLET(3.125 MG) BY MOUTH TWICE DAILY WITH A MEAL, Disp: 180 tablet, Rfl: 3 .  citalopram (CELEXA) 20 MG tablet, Take 20 mg by mouth daily., Disp: , Rfl:  .  clonazePAM (KLONOPIN) 0.5 MG tablet, Take 0.5 mg by mouth 3 (three) times daily as needed for anxiety. , Disp: , Rfl:  .  DULoxetine (CYMBALTA) 30 MG capsule, Take 30 mg by mouth daily. Take once in the morning., Disp: , Rfl:  .  fexofenadine  (ALLEGRA) 180 MG tablet, Take 180 mg by mouth 2 (two) times daily., Disp: , Rfl:  .  fludrocortisone (FLORINEF) 0.1 MG tablet, TAKE 1 TABLET(0.1 MG) BY MOUTH TWICE DAILY, Disp: 60 tablet, Rfl: 3 .  gabapentin (NEURONTIN) 300 MG capsule, Take 300 mg by mouth 3 (three) times daily. , Disp: , Rfl:  .  gabapentin (NEURONTIN) 800 MG tablet, Take 800 mg by mouth at bedtime as needed (neuropathy). , Disp: , Rfl:  .  ipratropium (ATROVENT) 0.03 % nasal spray, Place 2 sprays into both nostrils 2 (two) times daily., Disp: , Rfl:  .  levothyroxine (SYNTHROID) 88 MCG tablet, TAKE 1 TABLET(88 MCG) BY MOUTH DAILY BEFORE BREAKFAST, Disp: 45 tablet, Rfl: 1 .  midodrine (PROAMATINE) 10 MG tablet, Take 1 tablet (10 mg total) by mouth 3 (three) times daily with meals., Disp: 90 tablet, Rfl: 3 .  mirtazapine (REMERON) 30 MG tablet, mirtazapine 30 mg tablet, Disp: , Rfl:  .  montelukast (SINGULAIR) 10 MG tablet, Take 10 mg by mouth at bedtime. , Disp: , Rfl: 1 .  potassium chloride SA (KLOR-CON) 20 MEQ tablet, Take 1 tablet (20 mEq total) by mouth 2 (two) times daily., Disp: 60 tablet, Rfl: 11 .  probenecid (BENEMID) 500 MG tablet, Take 500 mg by mouth 2 (two) times daily., Disp: , Rfl:  .  simvastatin (ZOCOR) 10 MG tablet, Take 10 mg by mouth every evening. ,  Disp: , Rfl:  .  spironolactone (ALDACTONE) 25 MG tablet, TAKE 1/2 TABLET BY MOUTH EVERY EVENING, Disp: 45 tablet, Rfl: 1 .  buPROPion (WELLBUTRIN XL) 150 MG 24 hr tablet, Take 450 mg by mouth every morning. , Disp: , Rfl:  .  metolazone (ZAROXOLYN) 2.5 MG tablet, Take only as directed by CHF clinic, Disp: 10 tablet, Rfl: 0 .  Nabumetone (RELAFEN PO), nabumetone, Disp: , Rfl:  .  torsemide (DEMADEX) 20 MG tablet, Take 3 tablets (60 mg total) by mouth 2 (two) times daily., Disp: 180 tablet, Rfl: 6 .  zolpidem (AMBIEN) 10 MG tablet, Take 10 mg by mouth at bedtime as needed for sleep. For sleep , Disp: , Rfl:  Allergies  Allergen Reactions  . Ceftin Anaphylaxis     Face and throat swell   . Shellfish Allergy Other (See Comments)    Gout exacerbation  . Geodon [Ziprasidone Hcl] Hives  . Lisinopril Other (See Comments)    angioedema  . Allopurinol Nausea Only and Other (See Comments)    weakness  . Ativan [Lorazepam] Itching  . Sulfa Antibiotics Itching  . Ultram [Tramadol Hcl] Itching     Social History   Socioeconomic History  . Marital status: Married    Spouse name: Not on file  . Number of children: 2  . Years of education: 45  . Highest education level: Master's degree (e.g., MA, MS, MEng, MEd, MSW, MBA)  Occupational History  . Occupation: retired  Tobacco Use  . Smoking status: Former Smoker    Packs/day: 0.10    Years: 26.00    Pack years: 2.60    Types: Cigarettes    Quit date: 05/06/2007    Years since quitting: 12.2  . Smokeless tobacco: Never Used  Substance and Sexual Activity  . Alcohol use: No  . Drug use: No  . Sexual activity: Yes  Other Topics Concern  . 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 Difficulty of Paying Living Expenses: Not on file  Food Insecurity:   . Worried About Charity fundraiser in the Last Year: Not on file  . Ran Out of Food in the Last Year: Not on file  Transportation Needs:   . Lack of Transportation (Medical): Not on file  . Lack of Transportation (Non-Medical): Not on file  Physical Activity:   . Days of Exercise per Week: Not on file  . Minutes of Exercise per Session: Not on file  Stress:   . Feeling of Stress : Not on file  Social Connections:   . Frequency of Communication with Friends and Family:  Not on file  . Frequency of Social Gatherings with Friends and Family: Not on file  . Attends Religious Services: Not on file  . Active Member of Clubs or Organizations: Not on file  . Attends Archivist Meetings: Not on file  . Marital Status: Not on file  Intimate Partner Violence:   . Fear of Current or Ex-Partner: Not on file  .  Emotionally Abused: Not on file  . Physically Abused: Not on file  . Sexually Abused: Not on file    Physical Exam Vitals reviewed.  HENT:     Head: Normocephalic.     Nose: Nose normal.     Mouth/Throat:     Mouth: Mucous membranes are moist.  Eyes:     Pupils: Pupils are equal, round, and reactive to light.  Cardiovascular:     Rate and Rhythm: Normal rate and regular rhythm.     Pulses: Normal pulses.     Heart sounds: Normal heart sounds.  Pulmonary:     Effort: Pulmonary effort is normal.     Breath sounds: Normal breath sounds.  Abdominal:     Palpations: Abdomen is soft.  Musculoskeletal:        General: Swelling present. Normal range of motion.     Cervical back: Normal range of motion.     Right lower leg: Edema present.     Left lower leg: Edema present.     Comments: Bilateral leg swelling with redness and open sores.   Skin:    General: Skin is warm and dry.     Capillary Refill: Capillary refill takes less than 2 seconds.  Neurological:     Mental Status: She is alert. Mental status is at baseline.  Psychiatric:        Mood and Affect: Mood normal.    Arrived for home visit for Rhonda [name] who was alert and oriented without shortness of breath. Rhonda [name] reports muscular chest pain that worsens on palpation and movement, she reports she has fallen multiple times in the last 3 days and has multiple sites of soreness and bruises. She stated she took her night time medicines then got up to eat a snack and fell multiple times. Vitals were obtained. Medicines were reviewed and confirmed. Pill box filled. Sites of injury  include: chest, left shoulder, right shoulder, legs and neck. Legs were swollen and red with open sores. No pitting edema noted. Patient requested I send message to PCP about same. I also reached out to home health with [facility] RN Larena Glassman and provided her pictures of legs and information of falls. Patient was instructed to take Tylenol for same. Patient agreed and verbalized understanding. Home visit complete. Will follow up next week.    Weight- 175lbs      Future Appointments  Date Time Provider [place]  08/10/2019  9:50 AM Narda Amber K, DO [facility] None  08/14/2019  9:50 AM [facility] DEVICE REMOTES [facility]  10/18/2019  7:30 AM [facility] DEVICE REMOTES [facility]  01/17/2020  7:30 AM [facility] DEVICE REMOTES [facility]  04/17/2020  7:30 AM [facility] DEVICE REMOTES [facility]  06/12/2020 10:00 AM Philemon Kingdom, MD [facility] None     ACTION: Home visit completed Next visit planned for one week

## 2019-08-03 ENCOUNTER — Other Ambulatory Visit (HOSPITAL_COMMUNITY): Payer: Self-pay

## 2019-08-03 MED ORDER — METOLAZONE 2.5 MG PO TABS: ORAL_TABLET | ORAL | 3 refills | Status: DC

## 2019-08-04 NOTE — Telephone Encounter (Signed)
LMOM

## 2019-08-04 NOTE — Telephone Encounter (Signed)
Patient returned call to report her weight came down and LE swelling was better. Reports [name] with para [facility] had been out to see her and pictures reviewed by provider-swelling stable. Advised to follow up with PCP regarding pain medications side effects and follow up with therapist regarding depression  Patient appreciative of returned call.

## 2019-08-08 ENCOUNTER — Other Ambulatory Visit (HOSPITAL_COMMUNITY): Payer: Self-pay

## 2019-08-08 NOTE — Progress Notes (Signed)
Paramedicine Encounter    Patient ID: Rhonda [name], female    DOB: [birth year], 69 y.o.   MRN: [id number]   Patient Care Team: Maurice Small, MD as PCP - General (Family Medicine) [name], LCSW as Social Worker (Licensed Clinical Social Worker) [name], [name], DO as Consulting Physician (Neurology)  Patient Active Problem List   Diagnosis Date Noted  . Hypokalemia 11/08/2018  . CKD (chronic kidney disease) stage 3, GFR 30-59 ml/min 11/08/2018  . Orthostatic hypotension 07/28/2017  . SVD (spontaneous vaginal delivery)   . Peripheral neuropathy   . On home oxygen therapy   . Migraines   . Left bundle branch block   . Hypothyroidism   . Hypertension   . Hyperlipidemia   . Heart murmur   . GERD (gastroesophageal reflux disease)   . Exertional shortness of breath   . Major depressive disorder   . Back pain   . Arthritis   . Generalized anxiety disorder   . Anemia   . Chronic respiratory failure ([place]) 09/14/2013  . 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 Morbid obesity ([place]) 11/01/2012  . Chronic systolic heart failure ([place]) 10/27/2012  . Endometrial polyp 01/20/2012  . Unspecified vitamin D deficiency 03/26/2011    Current Outpatient Medications:  .  acetaZOLAMIDE (DIAMOX) 125 MG tablet, TAKE 1 TABLET(125 MG) BY MOUTH DAILY, Disp: 90 tablet, Rfl: 3 .  carboxymethylcellulose (REFRESH PLUS) 0.5 % SOLN, Place 1 drop into both eyes 3 (three) times daily as needed (dry eyes)., Disp: , Rfl:  .  carvedilol (COREG) 3.125 MG tablet, TAKE 1 TABLET(3.125 MG) BY MOUTH TWICE DAILY WITH A MEAL, Disp: 180 tablet, Rfl: 3 .  citalopram (CELEXA) 20 MG tablet, Take 20 mg by mouth daily., Disp: , Rfl:  .  clonazePAM (KLONOPIN) 0.5 MG tablet, Take 0.5 mg by mouth 3 (three) times daily as needed for anxiety. , Disp: , Rfl:  .  DULoxetine (CYMBALTA) 30 MG capsule, Take 30 mg by mouth daily. Take once in the morning., Disp: , Rfl:  .  fexofenadine  (ALLEGRA) 180 MG tablet, Take 180 mg by mouth 2 (two) times daily., Disp: , Rfl:  .  fludrocortisone (FLORINEF) 0.1 MG tablet, TAKE 1 TABLET(0.1 MG) BY MOUTH TWICE DAILY, Disp: 60 tablet, Rfl: 3 .  gabapentin (NEURONTIN) 300 MG capsule, Take 300 mg by mouth 3 (three) times daily. , Disp: , Rfl:  .  gabapentin (NEURONTIN) 800 MG tablet, Take 800 mg by mouth at bedtime as needed (neuropathy). , Disp: , Rfl:  .  ipratropium (ATROVENT) 0.03 % nasal spray, Place 2 sprays into both nostrils 2 (two) times daily., Disp: , Rfl:  .  levothyroxine (SYNTHROID) 88 MCG tablet, TAKE 1 TABLET(88 MCG) BY MOUTH DAILY BEFORE BREAKFAST, Disp: 45 tablet, Rfl: 1 .  midodrine (PROAMATINE) 10 MG tablet, Take 1 tablet (10 mg total) by mouth 3 (three) times daily with meals., Disp: 90 tablet, Rfl: 3 .  mirtazapine (REMERON) 30 MG tablet, mirtazapine 30 mg tablet, Disp: , Rfl:  .  montelukast (SINGULAIR) 10 MG tablet, Take 10 mg by mouth at bedtime. , Disp: , Rfl: 1 .  potassium chloride SA (KLOR-CON) 20 MEQ tablet, Take 1 tablet (20 mEq total) by mouth 2 (two) times daily., Disp: 60 tablet, Rfl: 11 .  probenecid (BENEMID) 500 MG tablet, Take 500 mg by mouth 2 (two) times daily., Disp: , Rfl:  .  simvastatin (ZOCOR) 10 MG tablet, Take 10 mg by mouth every evening. ,  Disp: , Rfl:  .  spironolactone (ALDACTONE) 25 MG tablet, TAKE 1/2 TABLET BY MOUTH EVERY EVENING, Disp: 45 tablet, Rfl: 1 .  torsemide (DEMADEX) 20 MG tablet, Take 3 tablets (60 mg total) by mouth 2 (two) times daily., Disp: 180 tablet, Rfl: 6 .  buPROPion (WELLBUTRIN XL) 150 MG 24 hr tablet, Take 450 mg by mouth every morning. , Disp: , Rfl:  .  metolazone (ZAROXOLYN) 2.5 MG tablet, Take only as directed by CHF clinic (Patient not taking: Reported on 08/08/2019), Disp: 10 tablet, Rfl: 3 .  Nabumetone (RELAFEN PO), nabumetone, Disp: , Rfl:  .  zolpidem (AMBIEN) 10 MG tablet, Take 10 mg by mouth at bedtime as needed for sleep. For sleep , Disp: , Rfl:  Allergies   Allergen Reactions  . Ceftin Anaphylaxis    Face and throat swell   . Shellfish Allergy Other (See Comments)    Gout exacerbation  . Geodon [Ziprasidone Hcl] Hives  . Lisinopril Other (See Comments)    angioedema  . Allopurinol Nausea Only and Other (See Comments)    weakness  . Ativan [Lorazepam] Itching  . Sulfa Antibiotics Itching  . Ultram [Tramadol Hcl] Itching     Social History   Socioeconomic History  . Marital status: Married    Spouse name: Not on file  . Number of children: 2  . Years of education: 21  . Highest education level: Master's degree (e.g., MA, MS, MEng, MEd, MSW, MBA)  Occupational History  . Occupation: retired  Tobacco Use  . Smoking status: Former Smoker    Packs/day: 0.10    Years: 26.00    Pack years: 2.60    Types: Cigarettes    Quit date: 05/06/2007    Years since quitting: 12.2  . Smokeless tobacco: Never Used  Substance and Sexual Activity  . Alcohol use: No  . Drug use: No  . Sexual activity: Yes  Other Topics Concern  . 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 Difficulty of Paying Living Expenses:   Food Insecurity:   . Worried About Charity fundraiser in the Last Year:   . Arboriculturist in the Last Year:   Transportation Needs:   . Film/video editor (Medical):   Marland Kitchen Lack of Transportation (Non-Medical):   Physical Activity:   . Days of Exercise per Week:   . Minutes of Exercise per Session:   Stress:   . Feeling of Stress :   Social Connections:   . Frequency of Communication with Friends and Family:   . Frequency of Social Gatherings with  Friends and Family:   . Attends Religious Services:   . Active Member of Clubs or Organizations:   . Attends Archivist Meetings:   Marland Kitchen Marital Status:   Intimate Partner Violence:   . Fear of Current or Ex-Partner:   . Emotionally Abused:   Marland Kitchen Physically Abused:   . Sexually Abused:     Physical Exam Constitutional:  Appearance: She is normal weight.  HENT:     Head: Normocephalic.     Nose: Nose normal.     Mouth/Throat:     Mouth: Mucous membranes are moist.  Eyes:     Pupils: Pupils are equal, round, and reactive to light.  Cardiovascular:     Rate and Rhythm: Normal rate and regular rhythm.     Pulses: Normal pulses.     Heart sounds: Normal heart sounds.  Pulmonary:     Effort: Pulmonary effort is normal.     Breath sounds: Normal breath sounds.  Abdominal:     Palpations: Abdomen is soft.  Musculoskeletal:        General: Swelling present. Normal range of motion.     Cervical back: Normal range of motion.     Comments: Open sores on legs with swelling.   Skin:    General: Skin is warm and dry.     Capillary Refill: Capillary refill takes less than 2 seconds.  Neurological:     Mental Status: She is alert. Mental status is at baseline.  Psychiatric:        Mood and Affect: Mood normal.     Arrived for home visit for [facility] who met me at door alert and oriented complaining of continuous leg swelling. Legs had swelling with open sores. No pitting edema noted.  Patient being started on Doxycycline per [facility] for Cellulitis treatment. Vitals obtained. Medications reviewed and confirmed. Pill box filled. Cheryn left with multiple notes with upcoming appointments. Larena Glassman RN with [facility] will visit patient tomorrow in home between 11-12.     No Refills.     Future Appointments  Date Time Provider [place]  08/10/2019  9:50 AM Narda Amber K, DO [facility] None  08/10/2019  4:30 PM [facility] 1 [facility]  08/14/2019   9:50 AM [facility] DEVICE REMOTES [facility]  10/18/2019  7:30 AM [facility] DEVICE REMOTES [facility]  01/17/2020  7:30 AM [facility] DEVICE REMOTES [facility]  04/17/2020  7:30 AM [facility] DEVICE REMOTES [facility]  06/12/2020 10:00 AM Philemon Kingdom, MD [facility] None     ACTION: Home visit completed Next visit planned for one week

## 2019-08-10 ENCOUNTER — Ambulatory Visit
Admission: RE | Admit: 2019-08-10 | Discharge: 2019-08-10 | Disposition: A | Discharge status: Home / Self Care | Payer: Medicare HMO | Source: Ambulatory Visit | Attending: Neurosurgery | Admitting: Neurosurgery

## 2019-08-10 ENCOUNTER — Other Ambulatory Visit: Payer: Self-pay

## 2019-08-10 ENCOUNTER — Other Ambulatory Visit: Payer: Medicare HMO

## 2019-08-10 ENCOUNTER — Telehealth: Payer: Medicare HMO | Admitting: Neurology

## 2019-08-10 DIAGNOSIS — M545 Low back pain: Secondary | ICD-10-CM | POA: Diagnosis not present

## 2019-08-10 DIAGNOSIS — R29898 Other symptoms and signs involving the musculoskeletal system: Secondary | ICD-10-CM

## 2019-08-14 ENCOUNTER — Telehealth: Payer: Self-pay

## 2019-08-14 ENCOUNTER — Ambulatory Visit (INDEPENDENT_AMBULATORY_CARE_PROVIDER_SITE_OTHER): Payer: Medicare HMO

## 2019-08-14 DIAGNOSIS — I5022 Chronic systolic (congestive) heart failure: Secondary | ICD-10-CM

## 2019-08-14 DIAGNOSIS — Z95810 Presence of automatic (implantable) cardiac defibrillator: Secondary | ICD-10-CM

## 2019-08-14 NOTE — Progress Notes (Signed)
EPIC Encounter for ICM Monitoring  Patient Name: Rhonda [name] is a 69 y.o. female Date: 08/14/2019 Primary Care Physican: Maurice Small, MD Primary Cardiologist:Bensimhon Electrophysiologist: Santina Evans Pacing:99.9% 07/14/2019 Weight: 180lbs   Attempted call to patient and unable to reach.  Transmission reviewed.   Optivol thoracic impedancecontinues to suggestpossibleongoingfluid accumulation since February 2020 but has improved in last 2 weeks.  Prescribed: Patient participating in paramedicine program to assist with meds.  Torsemide 20 mg3tablets (60 mg total)by mouthtwice a day.  Potassium 20 meq1tablets (20 mEq total)twice a day.   Metolazone 2.5mg  1 tablet by mouth as directed by CHF clinic  Labs: 06/07/2019 Creatinine 1.57, BUN 22, Potassium 3.6, Sodium 142, GFR 34-39 03/30/2019 Creatinine 1.68, BUN 24, Potassium 3.6, Sodium 139, GFR 31-36 01/11/2019 Creatinine 1.84, BUN 35, Potassium 4.2, Sodium 140, GFR 28-33 A complete set of results can be found in Results Review.  Recommendations:Unable to reach.    Follow-up plan: ICM clinic phone appointment on4/26/2021. 91 day device clinic remote transmission 10/18/2019.   Copy of ICM check sent to Dr.Taylor.  3 month ICM trend: 08/14/2019    1 Year ICM trend:       [name], RN 08/14/2019 3:55 PM

## 2019-08-14 NOTE — Telephone Encounter (Signed)
Remote ICM transmission received.  Attempted call to patient regarding ICM remote transmission and no answer.  

## 2019-08-15 ENCOUNTER — Other Ambulatory Visit (HOSPITAL_COMMUNITY): Payer: Self-pay

## 2019-08-15 NOTE — Progress Notes (Signed)
Paramedicine Encounter    Patient ID: Rhonda [name], female    DOB: 06/13/1950, 69 y.o.   MRN: [id number]   Patient Care Team: Maurice Small, MD as PCP - General (Family Medicine) [name], LCSW as Social Worker (Licensed Clinical Social Worker) [name], [name], DO as Consulting Physician (Neurology)  Patient Active Problem List   Diagnosis Date Noted  . Hypokalemia 11/08/2018  . CKD (chronic kidney disease) stage 3, GFR 30-59 ml/min 11/08/2018  . Orthostatic hypotension 07/28/2017  . SVD (spontaneous vaginal delivery)   . Peripheral neuropathy   . On home oxygen therapy   . Migraines   . Left bundle branch block   . Hypothyroidism   . Hypertension   . Hyperlipidemia   . Heart murmur   . GERD (gastroesophageal reflux disease)   . Exertional shortness of breath   . Major depressive disorder   . Back pain   . Arthritis   . Generalized anxiety disorder   . Anemia   . Chronic respiratory failure ([place]) 09/14/2013  . 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 Morbid obesity ([place]) 11/01/2012  . Chronic systolic heart failure ([place]) 10/27/2012  . Endometrial polyp 01/20/2012  . Unspecified vitamin D deficiency 03/26/2011    Current Outpatient Medications:  .  acetaZOLAMIDE (DIAMOX) 125 MG tablet, TAKE 1 TABLET(125 MG) BY MOUTH DAILY, Disp: 90 tablet, Rfl: 3 .  carvedilol (COREG) 3.125 MG tablet, TAKE 1 TABLET(3.125 MG) BY MOUTH TWICE DAILY WITH A MEAL, Disp: 180 tablet, Rfl: 3 .  citalopram (CELEXA) 20 MG tablet, Take 20 mg by mouth daily., Disp: , Rfl:  .  clonazePAM (KLONOPIN) 0.5 MG tablet, Take 0.5 mg by mouth 3 (three) times daily as needed for anxiety. , Disp: , Rfl:  .  DULoxetine (CYMBALTA) 30 MG capsule, Take 30 mg by mouth daily. Take once in the morning., Disp: , Rfl:  .  fexofenadine (ALLEGRA) 180 MG tablet, Take 180 mg by mouth 2 (two) times daily., Disp: , Rfl:  .  fludrocortisone (FLORINEF) 0.1 MG tablet, TAKE 1 TABLET(0.1  MG) BY MOUTH TWICE DAILY, Disp: 60 tablet, Rfl: 3 .  gabapentin (NEURONTIN) 300 MG capsule, Take 300 mg by mouth 3 (three) times daily. , Disp: , Rfl:  .  gabapentin (NEURONTIN) 800 MG tablet, Take 800 mg by mouth at bedtime as needed (neuropathy). , Disp: , Rfl:  .  ipratropium (ATROVENT) 0.03 % nasal spray, Place 2 sprays into both nostrils 2 (two) times daily., Disp: , Rfl:  .  levothyroxine (SYNTHROID) 88 MCG tablet, TAKE 1 TABLET(88 MCG) BY MOUTH DAILY BEFORE BREAKFAST, Disp: 45 tablet, Rfl: 1 .  midodrine (PROAMATINE) 10 MG tablet, Take 1 tablet (10 mg total) by mouth 3 (three) times daily with meals., Disp: 90 tablet, Rfl: 3 .  mirtazapine (REMERON) 30 MG tablet, mirtazapine 30 mg tablet, Disp: , Rfl:  .  montelukast (SINGULAIR) 10 MG tablet, Take 10 mg by mouth at bedtime. , Disp: , Rfl: 1 .  potassium chloride SA (KLOR-CON) 20 MEQ tablet, Take 1 tablet (20 mEq total) by mouth 2 (two) times daily., Disp: 60 tablet, Rfl: 11 .  probenecid (BENEMID) 500 MG tablet, Take 500 mg by mouth 2 (two) times daily., Disp: , Rfl:  .  simvastatin (ZOCOR) 10 MG tablet, Take 10 mg by mouth every evening. , Disp: , Rfl:  .  spironolactone (ALDACTONE) 25 MG tablet, TAKE 1/2 TABLET BY MOUTH EVERY EVENING, Disp: 45 tablet, Rfl: 1 .  torsemide (DEMADEX) 20 MG tablet, Take 3 tablets (60 mg total) by mouth 2 (two) times daily., Disp: 180 tablet, Rfl: 6 .  buPROPion (WELLBUTRIN XL) 150 MG 24 hr tablet, Take 450 mg by mouth every morning. , Disp: , Rfl:  .  carboxymethylcellulose (REFRESH PLUS) 0.5 % SOLN, Place 1 drop into both eyes 3 (three) times daily as needed (dry eyes)., Disp: , Rfl:  .  metolazone (ZAROXOLYN) 2.5 MG tablet, Take only as directed by CHF clinic (Patient not taking: Reported on 08/08/2019), Disp: 10 tablet, Rfl: 3 .  Nabumetone (RELAFEN PO), nabumetone, Disp: , Rfl:  .  zolpidem (AMBIEN) 10 MG tablet, Take 10 mg by mouth at bedtime as needed for sleep. For sleep , Disp: , Rfl:  Allergies   Allergen Reactions  . Ceftin Anaphylaxis    Face and throat swell   . Shellfish Allergy Other (See Comments)    Gout exacerbation  . Geodon [Ziprasidone Hcl] Hives  . Lisinopril Other (See Comments)    angioedema  . Allopurinol Nausea Only and Other (See Comments)    weakness  . Ativan [Lorazepam] Itching  . Sulfa Antibiotics Itching  . Ultram [Tramadol Hcl] Itching     Social History   Socioeconomic History  . Marital status: Married    Spouse name: Not on file  . Number of children: 2  . Years of education: 6  . Highest education level: Master's degree (e.g., MA, MS, MEng, MEd, MSW, MBA)  Occupational History  . Occupation: retired  Tobacco Use  . Smoking status: Former Smoker    Packs/day: 0.10    Years: 26.00    Pack years: 2.60    Types: Cigarettes    Quit date: 05/06/2007    Years since quitting: 12.2  . Smokeless tobacco: Never Used  Substance and Sexual Activity  . Alcohol use: No  . Drug use: No  . Sexual activity: Yes  Other Topics Concern  . 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 Difficulty of Paying Living Expenses:   Food Insecurity:   . Worried About Charity fundraiser in the Last Year:   . Arboriculturist in the Last Year:   Transportation Needs:   . Film/video editor (Medical):   Marland Kitchen Lack of Transportation (Non-Medical):   Physical Activity:   . Days of Exercise per Week:   . Minutes of Exercise per Session:   Stress:   . Feeling of Stress :   Social Connections:   . Frequency of Communication with Friends and Family:   . Frequency of Social Gatherings with  Friends and Family:   . Attends Religious Services:   . Active Member of Clubs or Organizations:   . Attends Archivist Meetings:   Marland Kitchen Marital Status:   Intimate Partner Violence:   . Fear of Current or Ex-Partner:   . Emotionally Abused:   Marland Kitchen Physically Abused:   . Sexually Abused:     Physical Exam Vitals reviewed.  Constitutional:  Appearance: She is normal weight.  HENT:     Head: Normocephalic.     Nose: Nose normal.     Mouth/Throat:     Mouth: Mucous membranes are moist.  Eyes:     Pupils: Pupils are equal, round, and reactive to light.  Cardiovascular:     Rate and Rhythm: Normal rate and regular rhythm.     Pulses: Normal pulses.     Heart sounds: Normal heart sounds.  Pulmonary:     Effort: Pulmonary effort is normal.     Breath sounds: Normal breath sounds.  Abdominal:     Palpations: Abdomen is soft.  Musculoskeletal:        General: Swelling present. Normal range of motion.     Cervical back: Normal range of motion.     Right lower leg: Edema present.     Left lower leg: Edema present.  Skin:    General: Skin is warm and dry.  Neurological:     Mental Status: She is alert. Mental status is at baseline.  Psychiatric:        Mood and Affect: Mood normal.     Arrived at home for Mrs. Ben where she met me at the door and denied any increased shortness of breath, chest pain or increased dizziness more than her normal. [name] had swelling in both lower legs but showed much improvement with decreased swelling and decreased redness since last week after being halfway through with the doxycycline for cellulitis. Vitals obtained and are as noted in report. Medications were reviewed and confirmed. Pill box filled for Mrs. Midkiff. Appointments verified and documented for herself and her husband. I also educated Mrs. Weinfeld on decreased amount of water she is drinking and juice. She was also educated on decreasing high sodium filled frozen meals. I spoke to  [place] RN with [facility] who advised she is getting patient referred to [facility] to assist with decreased amount of sodium filled meals. Mrs. Anstine agreed to visit in one week. Home visit complete.   Refills: [place] Midodrine  Fludrocortisone  Allegra Clonazepam      Future Appointments  Date Time Provider [place]  09/18/2019  8:40 AM [facility] DEVICE REMOTES [facility]  10/18/2019  7:30 AM [facility] DEVICE REMOTES [facility]  01/17/2020  7:30 AM [facility] DEVICE REMOTES [facility]  04/17/2020  7:30 AM [facility] DEVICE REMOTES [facility]  06/12/2020 10:00 AM Philemon Kingdom, MD [facility] None     ACTION: Home visit completed Next visit planned for one week

## 2019-08-16 ENCOUNTER — Telehealth (HOSPITAL_COMMUNITY): Payer: Self-pay

## 2019-08-16 ENCOUNTER — Other Ambulatory Visit (HOSPITAL_COMMUNITY): Payer: Self-pay

## 2019-08-16 DIAGNOSIS — Z79899 Other long term (current) drug therapy: Secondary | ICD-10-CM | POA: Diagnosis not present

## 2019-08-16 DIAGNOSIS — I951 Orthostatic hypotension: Secondary | ICD-10-CM | POA: Diagnosis not present

## 2019-08-16 DIAGNOSIS — I509 Heart failure, unspecified: Secondary | ICD-10-CM | POA: Diagnosis not present

## 2019-08-16 DIAGNOSIS — R55 Syncope and collapse: Secondary | ICD-10-CM | POA: Diagnosis not present

## 2019-08-16 MED ORDER — MIDODRINE HCL 10 MG PO TABS: 10.0000 mg | ORAL_TABLET | Freq: Three times a day (TID) | ORAL | 11 refills | Status: DC

## 2019-08-16 NOTE — Telephone Encounter (Signed)
Patient called me stating she received [employer] per [facility] today and was very appreciative. Call complete.

## 2019-08-22 ENCOUNTER — Telehealth (HOSPITAL_COMMUNITY): Payer: Self-pay | Admitting: *Deleted

## 2019-08-22 ENCOUNTER — Other Ambulatory Visit (HOSPITAL_COMMUNITY): Payer: Self-pay

## 2019-08-22 NOTE — Telephone Encounter (Signed)
Rhonda [name] w/paramedicine called to report pt had a 6lb weight gain over night. Pt has been taking her diuretic wrong. Pt prescribed torsemide 60mg  bid but pt had been taking torsemide 60mg  in the AM and 40mg  in the PM. Pt will start correct dose today and call our office if her weight does not go down.

## 2019-08-22 NOTE — Progress Notes (Signed)
Paramedicine Encounter    Patient ID: [name], female    DOB: May 12, 1951, 69 y.o.   MRN: [id number]   Patient Care Team: Maurice Small, MD as PCP - General (Family Medicine) [name], LCSW as Social Worker (Licensed Clinical Social Worker) [name], [name], DO as Consulting Physician (Neurology)  Patient Active Problem List   Diagnosis Date Noted  . Hypokalemia 11/08/2018  . CKD (chronic kidney disease) stage 3, GFR 30-59 ml/min 11/08/2018  . Orthostatic hypotension 07/28/2017  . SVD (spontaneous vaginal delivery)   . Peripheral neuropathy   . On home oxygen therapy   . Migraines   . Left bundle branch block   . Hypothyroidism   . Hypertension   . Hyperlipidemia   . Heart murmur   . GERD (gastroesophageal reflux disease)   . Exertional shortness of breath   . Major depressive disorder   . Back pain   . Arthritis   . Generalized anxiety disorder   . Anemia   . Chronic respiratory failure ([place]) 09/14/2013  . 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 Morbid obesity ([place]) 11/01/2012  . Chronic systolic heart failure ([place]) 10/27/2012  . Endometrial polyp 01/20/2012  . Unspecified vitamin D deficiency 03/26/2011    Current Outpatient Medications:  .  acetaZOLAMIDE (DIAMOX) 125 MG tablet, TAKE 1 TABLET(125 MG) BY MOUTH DAILY, Disp: 90 tablet, Rfl: 3 .  carboxymethylcellulose (REFRESH PLUS) 0.5 % SOLN, Place 1 drop into both eyes 3 (three) times daily as needed (dry eyes)., Disp: , Rfl:  .  carvedilol (COREG) 3.125 MG tablet, TAKE 1 TABLET(3.125 MG) BY MOUTH TWICE DAILY WITH A MEAL, Disp: 180 tablet, Rfl: 3 .  citalopram (CELEXA) 20 MG tablet, Take 20 mg by mouth daily., Disp: , Rfl:  .  clonazePAM (KLONOPIN) 0.5 MG tablet, Take 0.5 mg by mouth 3 (three) times daily as needed for anxiety. , Disp: , Rfl:  .  DULoxetine (CYMBALTA) 30 MG capsule, Take 30 mg by mouth daily. Take once in the morning., Disp: , Rfl:  .  fexofenadine  (ALLEGRA) 180 MG tablet, Take 180 mg by mouth 2 (two) times daily., Disp: , Rfl:  .  fludrocortisone (FLORINEF) 0.1 MG tablet, TAKE 1 TABLET(0.1 MG) BY MOUTH TWICE DAILY, Disp: 60 tablet, Rfl: 3 .  gabapentin (NEURONTIN) 300 MG capsule, Take 300 mg by mouth 3 (three) times daily. , Disp: , Rfl:  .  gabapentin (NEURONTIN) 800 MG tablet, Take 800 mg by mouth at bedtime as needed (neuropathy). , Disp: , Rfl:  .  ipratropium (ATROVENT) 0.03 % nasal spray, Place 2 sprays into both nostrils 2 (two) times daily., Disp: , Rfl:  .  levothyroxine (SYNTHROID) 88 MCG tablet, TAKE 1 TABLET(88 MCG) BY MOUTH DAILY BEFORE BREAKFAST, Disp: 45 tablet, Rfl: 1 .  metolazone (ZAROXOLYN) 2.5 MG tablet, Take only as directed by CHF clinic, Disp: 10 tablet, Rfl: 3 .  midodrine (PROAMATINE) 10 MG tablet, Take 1 tablet (10 mg total) by mouth 3 (three) times daily with meals., Disp: 90 tablet, Rfl: 11 .  mirtazapine (REMERON) 30 MG tablet, mirtazapine 30 mg tablet, Disp: , Rfl:  .  montelukast (SINGULAIR) 10 MG tablet, Take 10 mg by mouth at bedtime. , Disp: , Rfl: 1 .  Nabumetone (RELAFEN PO), nabumetone, Disp: , Rfl:  .  potassium chloride SA (KLOR-CON) 20 MEQ tablet, Take 1 tablet (20 mEq total) by mouth 2 (two) times daily., Disp: 60 tablet, Rfl: 11 .  probenecid (BENEMID) 500  MG tablet, Take 500 mg by mouth 2 (two) times daily., Disp: , Rfl:  .  simvastatin (ZOCOR) 10 MG tablet, Take 10 mg by mouth every evening. , Disp: , Rfl:  .  spironolactone (ALDACTONE) 25 MG tablet, TAKE 1/2 TABLET BY MOUTH EVERY EVENING, Disp: 45 tablet, Rfl: 1 .  torsemide (DEMADEX) 20 MG tablet, Take 3 tablets (60 mg total) by mouth 2 (two) times daily., Disp: 180 tablet, Rfl: 6 .  buPROPion (WELLBUTRIN XL) 150 MG 24 hr tablet, Take 450 mg by mouth every morning. , Disp: , Rfl:  .  zolpidem (AMBIEN) 10 MG tablet, Take 10 mg by mouth at bedtime as needed for sleep. For sleep , Disp: , Rfl:  Allergies  Allergen Reactions  . Ceftin Anaphylaxis     Face and throat swell   . Shellfish Allergy Other (See Comments)    Gout exacerbation  . Geodon [Ziprasidone Hcl] Hives  . Lisinopril Other (See Comments)    angioedema  . Allopurinol Nausea Only and Other (See Comments)    weakness  . Ativan [Lorazepam] Itching  . Sulfa Antibiotics Itching  . Ultram [Tramadol Hcl] Itching     Social History   Socioeconomic History  . Marital status: Married    Spouse name: Not on file  . Number of children: 2  . Years of education: 28  . Highest education level: Master's degree (e.g., MA, MS, MEng, MEd, MSW, MBA)  Occupational History  . Occupation: retired  Tobacco Use  . Smoking status: Former Smoker    Packs/day: 0.10    Years: 26.00    Pack years: 2.60    Types: Cigarettes    Quit date: 05/06/2007    Years since quitting: 12.3  . Smokeless tobacco: Never Used  Substance and Sexual Activity  . Alcohol use: No  . Drug use: No  . Sexual activity: Yes  Other Topics Concern  . 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 Difficulty of Paying Living Expenses:   Food Insecurity:   . Worried About Charity fundraiser in the Last Year:   . Arboriculturist in the Last Year:   Transportation Needs:   . Film/video editor (Medical):   Marland Kitchen Lack of Transportation (Non-Medical):   Physical Activity:   . Days of Exercise per Week:   . Minutes of Exercise per Session:   Stress:   . Feeling of Stress :   Social Connections:   . Frequency of Communication with Friends and Family:   . Frequency of Social Gatherings with Friends and Family:   . Attends Religious  Services:   . Active Member of Clubs or Organizations:   . Attends Archivist Meetings:   Marland Kitchen Marital Status:   Intimate Partner Violence:   . Fear of Current or Ex-Partner:   . Emotionally Abused:   Marland Kitchen Physically Abused:   . Sexually Abused:     Physical Exam Vitals reviewed.  HENT:     Head: Normocephalic.  Nose: Nose normal.     Mouth/Throat:     Mouth: Mucous membranes are moist.  Eyes:     Pupils: Pupils are equal, round, and reactive to light.  Cardiovascular:     Rate and Rhythm: Normal rate and regular rhythm.     Pulses: Normal pulses.     Heart sounds: Normal heart sounds.  Pulmonary:     Effort: Pulmonary effort is normal.  Abdominal:     General: Abdomen is flat.     Palpations: Abdomen is soft.  Musculoskeletal:        General: Swelling present. Normal range of motion.     Cervical back: Normal range of motion.     Right lower leg: Edema present.     Left lower leg: Edema present.  Skin:    General: Skin is warm and dry.     Capillary Refill: Capillary refill takes less than 2 seconds.  Neurological:     Mental Status: She is alert. Mental status is at baseline.  Psychiatric:        Mood and Affect: Mood normal.     Arrived for home visit for Ernestina who was crying upon my arrival stating she was depressed and sad today. I spoke to patient at length and by end of conversation Doras was laughing and in good spirits. Vitals were obtained. Both legs were swollen and tight upon palpation. Some edema noted. Weight increased and has been fluctuating over the last few weeks. I verified medication and contacted HF clinic in which they corrected me for her Torsemide dose it should be 60mg  twice daily. I corrected same and advised patient to monitor her weight closely and we would re-evaluate next week. Patient agreed with same. Assessment, vitals as noted. Medications filled in pill box accordingly. Somiya agreed to visit in one week. Home visit complete.    Refills: NONE  Weight- 192lbs  Weight yesterday 185lbs     Future Appointments  Date Time Provider Rankin  09/18/2019  8:40 AM [facility]  10/18/2019  7:30 AM [facility]ICE [facility]  01/17/2020  7:30 AM [facility]  04/17/2020  7:30 AM [facility]  06/12/2020 10:00 AM Philemon Kingdom, MD [facility] None     ACTION: Home visit completed Next visit planned for one week

## 2019-08-29 ENCOUNTER — Other Ambulatory Visit (HOSPITAL_COMMUNITY): Payer: Self-pay

## 2019-08-29 NOTE — Progress Notes (Signed)
Paramedicine Encounter    Patient ID: Rhonda [name], female    DOB: 1951/02/14, 69 y.o.   MRN: [id number]   Patient Care Team: Maurice Small, MD as PCP - General (Family Medicine) [name], LCSW as Social Worker (Licensed Clinical Social Worker) [name], [name], DO as Consulting Physician (Neurology)  Patient Active Problem List   Diagnosis Date Noted  . Hypokalemia 11/08/2018  . CKD (chronic kidney disease) stage 3, GFR 30-59 ml/min 11/08/2018  . Orthostatic hypotension 07/28/2017  . SVD (spontaneous vaginal delivery)   . Peripheral neuropathy   . On home oxygen therapy   . Migraines   . Left bundle branch block   . Hypothyroidism   . Hypertension   . Hyperlipidemia   . Heart murmur   . GERD (gastroesophageal reflux disease)   . Exertional shortness of breath   . Major depressive disorder   . Back pain   . Arthritis   . Generalized anxiety disorder   . Anemia   . Chronic respiratory failure ([place]) 09/14/2013  . 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 Morbid obesity ([place]) 11/01/2012  . Chronic systolic heart failure ([place]) 10/27/2012  . Endometrial polyp 01/20/2012  . Unspecified vitamin D deficiency 03/26/2011    Current Outpatient Medications:  .  acetaZOLAMIDE (DIAMOX) 125 MG tablet, TAKE 1 TABLET(125 MG) BY MOUTH DAILY, Disp: 90 tablet, Rfl: 3 .  carvedilol (COREG) 3.125 MG tablet, TAKE 1 TABLET(3.125 MG) BY MOUTH TWICE DAILY WITH A MEAL, Disp: 180 tablet, Rfl: 3 .  citalopram (CELEXA) 20 MG tablet, Take 20 mg by mouth daily., Disp: , Rfl:  .  clonazePAM (KLONOPIN) 0.5 MG tablet, Take 0.5 mg by mouth 3 (three) times daily as needed for anxiety. , Disp: , Rfl:  .  DULoxetine (CYMBALTA) 30 MG capsule, Take 30 mg by mouth daily. Take once in the morning., Disp: , Rfl:  .  fexofenadine (ALLEGRA) 180 MG tablet, Take 180 mg by mouth 2 (two) times daily., Disp: , Rfl:  .  fludrocortisone (FLORINEF) 0.1 MG tablet, TAKE 1 TABLET(0.1  MG) BY MOUTH TWICE DAILY, Disp: 60 tablet, Rfl: 3 .  gabapentin (NEURONTIN) 300 MG capsule, Take 300 mg by mouth 3 (three) times daily. , Disp: , Rfl:  .  ipratropium (ATROVENT) 0.03 % nasal spray, Place 2 sprays into both nostrils 2 (two) times daily., Disp: , Rfl:  .  levothyroxine (SYNTHROID) 88 MCG tablet, TAKE 1 TABLET(88 MCG) BY MOUTH DAILY BEFORE BREAKFAST, Disp: 45 tablet, Rfl: 1 .  midodrine (PROAMATINE) 10 MG tablet, Take 1 tablet (10 mg total) by mouth 3 (three) times daily with meals., Disp: 90 tablet, Rfl: 11 .  mirtazapine (REMERON) 30 MG tablet, mirtazapine 30 mg tablet, Disp: , Rfl:  .  montelukast (SINGULAIR) 10 MG tablet, Take 10 mg by mouth at bedtime. , Disp: , Rfl: 1 .  potassium chloride SA (KLOR-CON) 20 MEQ tablet, Take 1 tablet (20 mEq total) by mouth 2 (two) times daily., Disp: 60 tablet, Rfl: 11 .  probenecid (BENEMID) 500 MG tablet, Take 500 mg by mouth 2 (two) times daily., Disp: , Rfl:  .  simvastatin (ZOCOR) 10 MG tablet, Take 10 mg by mouth every evening. , Disp: , Rfl:  .  spironolactone (ALDACTONE) 25 MG tablet, TAKE 1/2 TABLET BY MOUTH EVERY EVENING, Disp: 45 tablet, Rfl: 1 .  torsemide (DEMADEX) 20 MG tablet, Take 3 tablets (60 mg total) by mouth 2 (two) times daily., Disp: 180 tablet, Rfl: 6 .  buPROPion (WELLBUTRIN XL) 150 MG 24 hr tablet, Take 450 mg by mouth every morning. , Disp: , Rfl:  .  carboxymethylcellulose (REFRESH PLUS) 0.5 % SOLN, Place 1 drop into both eyes 3 (three) times daily as needed (dry eyes)., Disp: , Rfl:  .  gabapentin (NEURONTIN) 800 MG tablet, Take 800 mg by mouth at bedtime as needed (neuropathy). , Disp: , Rfl:  .  metolazone (ZAROXOLYN) 2.5 MG tablet, Take only as directed by CHF clinic (Patient not taking: Reported on 08/29/2019), Disp: 10 tablet, Rfl: 3 .  Nabumetone (RELAFEN PO), nabumetone, Disp: , Rfl:  .  zolpidem (AMBIEN) 10 MG tablet, Take 10 mg by mouth at bedtime as needed for sleep. For sleep , Disp: , Rfl:  Allergies   Allergen Reactions  . Ceftin Anaphylaxis    Face and throat swell   . Shellfish Allergy Other (See Comments)    Gout exacerbation  . Geodon [Ziprasidone Hcl] Hives  . Lisinopril Other (See Comments)    angioedema  . Allopurinol Nausea Only and Other (See Comments)    weakness  . Ativan [Lorazepam] Itching  . Sulfa Antibiotics Itching  . Ultram [Tramadol Hcl] Itching     Social History   Socioeconomic History  . Marital status: Married    Spouse name: Not on file  . Number of children: 2  . Years of education: 5  . Highest education level: Master's degree (e.g., MA, MS, MEng, MEd, MSW, MBA)  Occupational History  . Occupation: retired  Tobacco Use  . Smoking status: Former Smoker    Packs/day: 0.10    Years: 26.00    Pack years: 2.60    Types: Cigarettes    Quit date: 05/06/2007    Years since quitting: 12.3  . Smokeless tobacco: Never Used  Substance and Sexual Activity  . Alcohol use: No  . Drug use: No  . Sexual activity: Yes  Other Topics Concern  . 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 Difficulty of Paying Living Expenses:   Food Insecurity:   . Worried About Charity fundraiser in the Last Year:   . Arboriculturist in the Last Year:   Transportation Needs:   . Film/video editor (Medical):   Marland Kitchen Lack of Transportation (Non-Medical):   Physical Activity:   . Days of Exercise per Week:   . Minutes of Exercise per Session:   Stress:   . Feeling of Stress :   Social Connections:   . Frequency of Communication with Friends and Family:   . Frequency of Social Gatherings with  Friends and Family:   . Attends Religious Services:   . Active Member of Clubs or Organizations:   . Attends Archivist Meetings:   Marland Kitchen Marital Status:   Intimate Partner Violence:   . Fear of Current or Ex-Partner:   . Emotionally Abused:   Marland Kitchen Physically Abused:   . Sexually Abused:     Physical Exam Vitals reviewed.  Constitutional:  Appearance: She is normal weight.  HENT:     Head: Normocephalic.     Nose: Nose normal.     Mouth/Throat:     Mouth: Mucous membranes are moist.     Pharynx: Oropharynx is clear.  Eyes:     Pupils: Pupils are equal, round, and reactive to light.  Cardiovascular:     Rate and Rhythm: Normal rate and regular rhythm.     Pulses: Normal pulses.     Heart sounds: Normal heart sounds.  Pulmonary:     Effort: Pulmonary effort is normal.     Breath sounds: Normal breath sounds.  Abdominal:     Palpations: Abdomen is soft.  Musculoskeletal:        General: Swelling present. Normal range of motion.     Cervical back: Normal range of motion.     Right lower leg: Edema present.     Left lower leg: Edema present.  Skin:    General: Skin is warm and dry.     Capillary Refill: Capillary refill takes less than 2 seconds.  Neurological:     General: No focal deficit present.     Mental Status: She is alert. Mental status is at baseline.  Psychiatric:        Mood and Affect: Mood normal.     Arrived for Mrs. Labonte's home visit where she was seated on the couch being assessed by [facility] RN Larena Glassman. Larena Glassman reports patient's weight being down this week from 192lbs to 179lbs. Patient's legs appear to be swollen but much less than normal and redness is not present this week. Patient still complaining of spinal pain which she is being followed for spinal stenosis by [facility] at [facility]. Vitals were obtained. Medications reviewed and confirmed. Pill box filled for one week. Mrs. Burling denied any shortness of breath, dizziness or  N/V/D. Mrs. Sabir has been eating [employer]'s Meals which have been delivered weekly. She stated they are helping her lose weight and feel better. These will continue to be delivered. Patient being followed by [facility] monthly. Mobility team with [facility] coming out Thursday to give patient compression stockings to assist with swelling. Miesha agreed to visit in one week. Home visit complete.   Refills: Gabapentin (800mg )  Weight- 179lbs     Future Appointments  Date Time Provider [place]  09/18/2019  8:40 AM [facility]  10/18/2019  7:30 AM [facility]  01/17/2020  7:30 AM [facility]  04/17/2020  7:30 AM [facility]ICE [facility]  06/12/2020 10:00 AM Philemon Kingdom, MD [facility] None     ACTION: Home visit completed Next visit planned for one week

## 2019-08-30 ENCOUNTER — Other Ambulatory Visit (HOSPITAL_COMMUNITY): Payer: Self-pay

## 2019-08-30 MED ORDER — POTASSIUM CHLORIDE CRYS ER 20 MEQ PO TBCR: 20.0000 meq | EXTENDED_RELEASE_TABLET | Freq: Two times a day (BID) | ORAL | 3 refills | Status: DC

## 2019-08-30 MED ORDER — TORSEMIDE 20 MG PO TABS: 60.0000 mg | ORAL_TABLET | Freq: Two times a day (BID) | ORAL | 3 refills | Status: DC

## 2019-08-31 ENCOUNTER — Other Ambulatory Visit: Payer: Self-pay | Admitting: Internal Medicine

## 2019-08-31 ENCOUNTER — Other Ambulatory Visit: Payer: Self-pay | Admitting: *Deleted

## 2019-08-31 MED ORDER — LEVOTHYROXINE SODIUM 88 MCG PO TABS: ORAL_TABLET | ORAL | 1 refills | Status: DC

## 2019-09-01 ENCOUNTER — Other Ambulatory Visit (HOSPITAL_COMMUNITY): Payer: Self-pay

## 2019-09-01 MED ORDER — MIDODRINE HCL 10 MG PO TABS: 10.0000 mg | ORAL_TABLET | Freq: Three times a day (TID) | ORAL | 3 refills | Status: DC

## 2019-09-01 MED ORDER — METOLAZONE 2.5 MG PO TABS: ORAL_TABLET | ORAL | 3 refills | Status: DC

## 2019-09-01 MED ORDER — CARVEDILOL 3.125 MG PO TABS: ORAL_TABLET | ORAL | 3 refills | Status: DC

## 2019-09-01 MED ORDER — SPIRONOLACTONE 25 MG PO TABS: 12.5000 mg | ORAL_TABLET | Freq: Every evening | ORAL | 3 refills | Status: DC

## 2019-09-05 ENCOUNTER — Other Ambulatory Visit (HOSPITAL_COMMUNITY): Payer: Self-pay

## 2019-09-05 NOTE — Progress Notes (Signed)
Paramedicine Encounter    Patient ID: Rhonda [name], female    DOB: 1950-09-25, 69 y.o.   MRN: [id number]   Patient Care Team: Maurice Small, MD as PCP - General (Family Medicine) [name], LCSW as Social Worker (Licensed Clinical Social Worker) [name], [name], DO as Consulting Physician (Neurology)  Patient Active Problem List   Diagnosis Date Noted  . Hypokalemia 11/08/2018  . CKD (chronic kidney disease) stage 3, GFR 30-59 ml/min 11/08/2018  . Orthostatic hypotension 07/28/2017  . SVD (spontaneous vaginal delivery)   . Peripheral neuropathy   . On home oxygen therapy   . Migraines   . Left bundle branch block   . Hypothyroidism   . Hypertension   . Hyperlipidemia   . Heart murmur   . GERD (gastroesophageal reflux disease)   . Exertional shortness of breath   . Major depressive disorder   . Back pain   . Arthritis   . Generalized anxiety disorder   . Anemia   . Chronic respiratory failure ([place]) 09/14/2013  . 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 Morbid obesity ([place]) 11/01/2012  . Chronic systolic heart failure ([place]) 10/27/2012  . Endometrial polyp 01/20/2012  . Unspecified vitamin D deficiency 03/26/2011    Current Outpatient Medications:  .  acetaZOLAMIDE (DIAMOX) 125 MG tablet, TAKE 1 TABLET(125 MG) BY MOUTH DAILY, Disp: 90 tablet, Rfl: 3 .  carboxymethylcellulose (REFRESH PLUS) 0.5 % SOLN, Place 1 drop into both eyes 3 (three) times daily as needed (dry eyes)., Disp: , Rfl:  .  carvedilol (COREG) 3.125 MG tablet, TAKE 1 TABLET(3.125 MG) BY MOUTH TWICE DAILY WITH A MEAL, Disp: 180 tablet, Rfl: 3 .  citalopram (CELEXA) 20 MG tablet, Take 20 mg by mouth daily., Disp: , Rfl:  .  clonazePAM (KLONOPIN) 0.5 MG tablet, Take 0.5 mg by mouth 3 (three) times daily as needed for anxiety. , Disp: , Rfl:  .  DULoxetine (CYMBALTA) 30 MG capsule, Take 30 mg by mouth daily. Take once in the morning., Disp: , Rfl:  .  fexofenadine  (ALLEGRA) 180 MG tablet, Take 180 mg by mouth 2 (two) times daily., Disp: , Rfl:  .  fludrocortisone (FLORINEF) 0.1 MG tablet, TAKE 1 TABLET(0.1 MG) BY MOUTH TWICE DAILY, Disp: 60 tablet, Rfl: 3 .  gabapentin (NEURONTIN) 300 MG capsule, Take 300 mg by mouth 3 (three) times daily. , Disp: , Rfl:  .  gabapentin (NEURONTIN) 800 MG tablet, Take 800 mg by mouth at bedtime as needed (neuropathy). , Disp: , Rfl:  .  ipratropium (ATROVENT) 0.03 % nasal spray, Place 2 sprays into both nostrils 2 (two) times daily., Disp: , Rfl:  .  levothyroxine (SYNTHROID) 88 MCG tablet, TAKE 1 TABLET(88 MCG) BY MOUTH DAILY BEFORE BREAKFAST, Disp: 90 tablet, Rfl: 1 .  midodrine (PROAMATINE) 10 MG tablet, Take 1 tablet (10 mg total) by mouth 3 (three) times daily with meals., Disp: 270 tablet, Rfl: 3 .  mirtazapine (REMERON) 30 MG tablet, mirtazapine 30 mg tablet, Disp: , Rfl:  .  montelukast (SINGULAIR) 10 MG tablet, Take 10 mg by mouth at bedtime. , Disp: , Rfl: 1 .  potassium chloride SA (KLOR-CON) 20 MEQ tablet, Take 1 tablet (20 mEq total) by mouth 2 (two) times daily., Disp: 180 tablet, Rfl: 3 .  probenecid (BENEMID) 500 MG tablet, Take 500 mg by mouth 2 (two) times daily., Disp: , Rfl:  .  simvastatin (ZOCOR) 10 MG tablet, Take 10 mg by mouth every evening. ,  Disp: , Rfl:  .  spironolactone (ALDACTONE) 25 MG tablet, Take 0.5 tablets (12.5 mg total) by mouth every evening., Disp: 45 tablet, Rfl: 3 .  torsemide (DEMADEX) 20 MG tablet, Take 3 tablets (60 mg total) by mouth 2 (two) times daily., Disp: 540 tablet, Rfl: 3 .  buPROPion (WELLBUTRIN XL) 150 MG 24 hr tablet, Take 450 mg by mouth every morning. , Disp: , Rfl:  .  metolazone (ZAROXOLYN) 2.5 MG tablet, Take only as directed by CHF clinic (Patient not taking: Reported on 09/05/2019), Disp: 10 tablet, Rfl: 3 .  Nabumetone (RELAFEN PO), nabumetone, Disp: , Rfl:  .  zolpidem (AMBIEN) 10 MG tablet, Take 10 mg by mouth at bedtime as needed for sleep. For sleep , Disp: ,  Rfl:  Allergies  Allergen Reactions  . Ceftin Anaphylaxis    Face and throat swell   . Shellfish Allergy Other (See Comments)    Gout exacerbation  . Geodon [Ziprasidone Hcl] Hives  . Lisinopril Other (See Comments)    angioedema  . Allopurinol Nausea Only and Other (See Comments)    weakness  . Ativan [Lorazepam] Itching  . Sulfa Antibiotics Itching  . Ultram [Tramadol Hcl] Itching     Social History   Socioeconomic History  . Marital status: Married    Spouse name: Not on file  . Number of children: 2  . Years of education: 25  . Highest education level: Master's degree (e.g., MA, MS, MEng, MEd, MSW, MBA)  Occupational History  . Occupation: retired  Tobacco Use  . Smoking status: Former Smoker    Packs/day: 0.10    Years: 26.00    Pack years: 2.60    Types: Cigarettes    Quit date: 05/06/2007    Years since quitting: 12.3  . Smokeless tobacco: Never Used  Substance and Sexual Activity  . Alcohol use: No  . Drug use: No  . Sexual activity: Yes  Other Topics Concern  . 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 Difficulty of Paying Living Expenses:   Food Insecurity:   . Worried About Charity fundraiser in the Last Year:   . Arboriculturist in the Last Year:   Transportation Needs:   . Film/video editor (Medical):   Marland Kitchen Lack of Transportation (Non-Medical):   Physical Activity:   . Days of Exercise per Week:   . Minutes of Exercise per Session:   Stress:   . Feeling of Stress :   Social Connections:   . Frequency of Communication with Friends and Family:   . Frequency of Social  Gatherings with Friends and Family:   . Attends Religious Services:   . Active Member of Clubs or Organizations:   . Attends Archivist Meetings:   Marland Kitchen Marital Status:   Intimate Partner Violence:   . Fear of Current or Ex-Partner:   . Emotionally Abused:   Marland Kitchen Physically Abused:   . Sexually Abused:     Physical Exam Vitals reviewed.  Constitutional:      Appearance: She is normal weight.  HENT:     Head: Normocephalic.     Nose: Nose normal.     Mouth/Throat:     Mouth: Mucous membranes are moist.     Pharynx: Oropharynx is clear.  Eyes:     Pupils: Pupils are equal, round, and reactive to light.  Cardiovascular:     Rate and Rhythm: Normal rate and regular rhythm.     Pulses: Normal pulses.     Heart sounds: Normal heart sounds.  Pulmonary:     Effort: Pulmonary effort is normal.     Breath sounds: Normal breath sounds.  Abdominal:     Palpations: Abdomen is soft.  Musculoskeletal:        General: Swelling present. Normal range of motion.     Cervical back: Normal range of motion.     Right lower leg: Edema present.     Left lower leg: Edema present.  Skin:    General: Skin is warm and dry.     Capillary Refill: Capillary refill takes less than 2 seconds.  Neurological:     Mental Status: She is alert. Mental status is at baseline.  Psychiatric:        Mood and Affect: Mood normal.    Arrived for home visit for [facility] who was alert and oriented seated in her living room upset and crying over some emotional stress she is dealing with. I talked with patient and was able to comfort her and calm her down. Assessment noted leg swelling with no redness or open wounds noted. Slight edema noted. Patient has been compliant with all medications. Medications were reviewed and confirmed pill box filled accordingly. Vitals were obtained and are as noted.  Weight noted to have increased by 8lbs since a week ago. Patent stated she has been eating more than her planned  meals with Mom[employer] Meals. Refills as noted.   Refills: -Clonazepam -Gabapentin    Weight today- 188lbs         Future Appointments  Date Time Provider [place]  09/18/2019  8:40 AM [facility]ICE [facility]OTES [facility]  10/18/2019  7:30 AM [facility] DEVICE REMOTES [facility]  01/17/2020  7:30 AM [facility]OT[facility]  04/17/2020  7:30 AM [facility]  06/12/2020 10:00 AM Philemon Kingdom, MD [facility] None     ACTION: Home visit completed Next visit planned for one week

## 2019-09-07 ENCOUNTER — Other Ambulatory Visit (HOSPITAL_COMMUNITY): Payer: Self-pay

## 2019-09-07 MED ORDER — POTASSIUM CHLORIDE CRYS ER 20 MEQ PO TBCR: 20.0000 meq | EXTENDED_RELEASE_TABLET | Freq: Two times a day (BID) | ORAL | 3 refills | Status: DC

## 2019-09-07 MED ORDER — TORSEMIDE 20 MG PO TABS: 60.0000 mg | ORAL_TABLET | Freq: Two times a day (BID) | ORAL | 3 refills | Status: DC

## 2019-09-08 ENCOUNTER — Other Ambulatory Visit (HOSPITAL_COMMUNITY): Payer: Self-pay

## 2019-09-08 DIAGNOSIS — M542 Cervicalgia: Secondary | ICD-10-CM | POA: Diagnosis not present

## 2019-09-08 DIAGNOSIS — R52 Pain, unspecified: Secondary | ICD-10-CM | POA: Diagnosis not present

## 2019-09-08 DIAGNOSIS — W19XXXA Unspecified fall, initial encounter: Secondary | ICD-10-CM | POA: Diagnosis not present

## 2019-09-08 MED ORDER — SPIRONOLACTONE 25 MG PO TABS: 12.5000 mg | ORAL_TABLET | Freq: Every evening | ORAL | 3 refills | Status: DC

## 2019-09-11 DIAGNOSIS — M4316 Spondylolisthesis, lumbar region: Secondary | ICD-10-CM | POA: Diagnosis not present

## 2019-09-11 DIAGNOSIS — R29898 Other symptoms and signs involving the musculoskeletal system: Secondary | ICD-10-CM | POA: Diagnosis not present

## 2019-09-12 ENCOUNTER — Other Ambulatory Visit (HOSPITAL_COMMUNITY): Payer: Self-pay

## 2019-09-12 NOTE — Progress Notes (Signed)
Paramedicine Encounter    Patient ID: Rhonda [name], female    DOB: July 14, 1950, 69 y.o.   MRN: [id number]   Patient Care Team: Maurice Small, MD as PCP - General (Family Medicine) [name], LCSW as Social Worker (Licensed Clinical Social Worker) [name], [name], DO as Consulting Physician (Neurology)  Patient Active Problem List   Diagnosis Date Noted  . Hypokalemia 11/08/2018  . CKD (chronic kidney disease) stage 3, GFR 30-59 ml/min 11/08/2018  . Orthostatic hypotension 07/28/2017  . SVD (spontaneous vaginal delivery)   . Peripheral neuropathy   . On home oxygen therapy   . Migraines   . Left bundle branch block   . Hypothyroidism   . Hypertension   . Hyperlipidemia   . Heart murmur   . GERD (gastroesophageal reflux disease)   . Exertional shortness of breath   . Major depressive disorder   . Back pain   . Arthritis   . Generalized anxiety disorder   . Anemia   . Chronic respiratory failure ([place]) 09/14/2013  . 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 Morbid obesity ([place]) 11/01/2012  . Chronic systolic heart failure ([place]) 10/27/2012  . Endometrial polyp 01/20/2012  . Unspecified vitamin D deficiency 03/26/2011    Current Outpatient Medications:  .  acetaZOLAMIDE (DIAMOX) 125 MG tablet, TAKE 1 TABLET(125 MG) BY MOUTH DAILY, Disp: 90 tablet, Rfl: 3 .  carboxymethylcellulose (REFRESH PLUS) 0.5 % SOLN, Place 1 drop into both eyes 3 (three) times daily as needed (dry eyes)., Disp: , Rfl:  .  carvedilol (COREG) 3.125 MG tablet, TAKE 1 TABLET(3.125 MG) BY MOUTH TWICE DAILY WITH A MEAL, Disp: 180 tablet, Rfl: 3 .  citalopram (CELEXA) 20 MG tablet, Take 20 mg by mouth daily., Disp: , Rfl:  .  clonazePAM (KLONOPIN) 0.5 MG tablet, Take 0.5 mg by mouth 3 (three) times daily as needed for anxiety. , Disp: , Rfl:  .  DULoxetine (CYMBALTA) 30 MG capsule, Take 30 mg by mouth daily. Take once in the morning., Disp: , Rfl:  .  fexofenadine  (ALLEGRA) 180 MG tablet, Take 180 mg by mouth 2 (two) times daily., Disp: , Rfl:  .  fludrocortisone (FLORINEF) 0.1 MG tablet, TAKE 1 TABLET(0.1 MG) BY MOUTH TWICE DAILY, Disp: 60 tablet, Rfl: 3 .  gabapentin (NEURONTIN) 300 MG capsule, Take 300 mg by mouth 3 (three) times daily. , Disp: , Rfl:  .  ipratropium (ATROVENT) 0.03 % nasal spray, Place 2 sprays into both nostrils 2 (two) times daily., Disp: , Rfl:  .  levothyroxine (SYNTHROID) 88 MCG tablet, TAKE 1 TABLET(88 MCG) BY MOUTH DAILY BEFORE BREAKFAST, Disp: 90 tablet, Rfl: 1 .  midodrine (PROAMATINE) 10 MG tablet, Take 1 tablet (10 mg total) by mouth 3 (three) times daily with meals., Disp: 270 tablet, Rfl: 3 .  mirtazapine (REMERON) 30 MG tablet, mirtazapine 30 mg tablet, Disp: , Rfl:  .  montelukast (SINGULAIR) 10 MG tablet, Take 10 mg by mouth at bedtime. , Disp: , Rfl: 1 .  potassium chloride SA (KLOR-CON) 20 MEQ tablet, Take 1 tablet (20 mEq total) by mouth 2 (two) times daily., Disp: 180 tablet, Rfl: 3 .  probenecid (BENEMID) 500 MG tablet, Take 500 mg by mouth 2 (two) times daily., Disp: , Rfl:  .  simvastatin (ZOCOR) 10 MG tablet, Take 10 mg by mouth every evening. , Disp: , Rfl:  .  spironolactone (ALDACTONE) 25 MG tablet, Take 0.5 tablets (12.5 mg total) by mouth every evening.,  Disp: 45 tablet, Rfl: 3 .  torsemide (DEMADEX) 20 MG tablet, Take 3 tablets (60 mg total) by mouth 2 (two) times daily., Disp: 540 tablet, Rfl: 3 .  buPROPion (WELLBUTRIN XL) 150 MG 24 hr tablet, Take 450 mg by mouth every morning. , Disp: , Rfl:  .  gabapentin (NEURONTIN) 800 MG tablet, Take 800 mg by mouth at bedtime as needed (neuropathy). , Disp: , Rfl:  .  metolazone (ZAROXOLYN) 2.5 MG tablet, Take only as directed by CHF clinic (Patient not taking: Reported on 09/05/2019), Disp: 10 tablet, Rfl: 3 .  Nabumetone (RELAFEN PO), nabumetone, Disp: , Rfl:  .  zolpidem (AMBIEN) 10 MG tablet, Take 10 mg by mouth at bedtime as needed for sleep. For sleep , Disp: ,  Rfl:  Allergies  Allergen Reactions  . Ceftin Anaphylaxis    Face and throat swell   . Shellfish Allergy Other (See Comments)    Gout exacerbation  . Geodon [Ziprasidone Hcl] Hives  . Lisinopril Other (See Comments)    angioedema  . Allopurinol Nausea Only and Other (See Comments)    weakness  . Ativan [Lorazepam] Itching  . Sulfa Antibiotics Itching  . Ultram [Tramadol Hcl] Itching     Social History   Socioeconomic History  . Marital status: Married    Spouse name: Not on file  . Number of children: 2  . Years of education: 61  . Highest education level: Master's degree (e.g., MA, MS, MEng, MEd, MSW, MBA)  Occupational History  . Occupation: retired  Tobacco Use  . Smoking status: Former Smoker    Packs/day: 0.10    Years: 26.00    Pack years: 2.60    Types: Cigarettes    Quit date: 05/06/2007    Years since quitting: 12.3  . Smokeless tobacco: Never Used  Substance and Sexual Activity  . Alcohol use: No  . Drug use: No  . Sexual activity: Yes  Other Topics Concern  . 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 Difficulty of Paying Living Expenses:   Food Insecurity:   . Worried About Charity fundraiser in the Last Year:   . Arboriculturist in the Last Year:   Transportation Needs:   . Film/video editor (Medical):   Marland Kitchen Lack of Transportation (Non-Medical):   Physical Activity:   . Days of Exercise per Week:   . Minutes of Exercise per Session:   Stress:   . Feeling of Stress :   Social Connections:   . Frequency of Communication with Friends and Family:   . Frequency of Social  Gatherings with Friends and Family:   . Attends Religious Services:   . Active Member of Clubs or Organizations:   . Attends Archivist Meetings:   Marland Kitchen Marital Status:   Intimate Partner Violence:   . Fear of Current or Ex-Partner:   . Emotionally Abused:   Marland Kitchen Physically Abused:   . Sexually Abused:     Physical Exam Vitals  reviewed.  HENT:     Head: Normocephalic.     Nose: Nose normal.     Mouth/Throat:     Mouth: Mucous membranes are moist.  Eyes:     Pupils: Pupils are equal, round, and reactive to light.  Cardiovascular:     Rate and Rhythm: Normal rate and regular rhythm.     Pulses: Normal pulses.     Heart sounds: Normal heart sounds.  Pulmonary:     Effort: Pulmonary effort is normal.     Breath sounds: Normal breath sounds.  Abdominal:     General: Abdomen is flat. There is no distension.     Palpations: Abdomen is soft.  Musculoskeletal:        General: Swelling and signs of injury present. Normal range of motion.     Cervical back: Tenderness present.     Right lower leg: Edema present.     Left lower leg: Edema present.     Comments: Neck injury from fall, in brace at present.   Skin:    General: Skin is warm and dry.     Capillary Refill: Capillary refill takes less than 2 seconds.  Neurological:     General: No focal deficit present.     Mental Status: She is alert.  Psychiatric:        Mood and Affect: Mood normal.     Arrived for home visit for Mrs. Peggs who was seated on the couch while [facility] Physical Therapist Stanton Kidney was present. Shalen reports having a fall on Saturday and having to go see her Neurospinal doctor and them waiting to do a CT and MRI sometime this week. She is currently in a neck brace and in significant pain. Patient is taking Prednisone and Hydrocodone as well. Vitals were obtained. Patient weight is up 11lbs from last week, patient stated she has been eating more but this could be contributing from steroid. I will  consult with HF clinic in the AM due to after hours on visit time. Patient also reports getting meds by [facility] now, I will contact and follow up. Patient's legs were swollen with slight edema. No JVD, lung sounds clear, abdomen soft non distended. I will follow up in the morning after I speak to HF clinic. Home visit complete.   Refills: Clonazepam Allegra  Weight last week- 190lbs Weight this week- 201lbs     Future Appointments  Date Time Provider [place]  09/18/2019  8:40 AM [facility] DEVICE REMOTES [facility]  10/18/2019  7:30 AM [facility] DEVICE REMOT[facility]  11/08/2019 12:40 PM Bensimhon, Shaune Pascal, MD [facility] None  01/17/2020  7:30 AM [facility] DEVICE REMOTES [facility]  04/17/2020  7:30 AM [facility]  06/12/2020 10:00 AM Philemon Kingdom, MD [facility] None     ACTION: Home visit completed Next visit planned for one week

## 2019-09-13 ENCOUNTER — Other Ambulatory Visit (HOSPITAL_COMMUNITY): Payer: Self-pay

## 2019-09-13 ENCOUNTER — Telehealth (HOSPITAL_COMMUNITY): Payer: Self-pay | Admitting: *Deleted

## 2019-09-13 DIAGNOSIS — M158 Other polyosteoarthritis: Secondary | ICD-10-CM | POA: Diagnosis not present

## 2019-09-13 NOTE — Telephone Encounter (Signed)
[name] w/paramedicine called to report pt had an 11lb weight gain in 1 week. Pt had a fall and was prescribed prednisone 5mg  x 10days. Pt is on day three. Per [name] she is taking all medications as prescribed. Per Lyda Jester, PA have pt take 2.5mg  of metolazone today with 29meq of K if her weight is not down on Friday she can repeat metolazone and K. [name] aware and will make a home visit to patient today.

## 2019-09-13 NOTE — Progress Notes (Signed)
Spoke to HF Clinic triage this morning and they instructed Tamre to take one metolazone today and 40 of potassium and to take same on Friday if no improvements of weight. I went out today to place same in pill box and left instructions for husband and patient's daughter about same. Mailan was on call with her PCP on my arrival. Family aware and understands. I will follow up on Monday to see how patient is feeling and go out Tuesday for home visit.

## 2019-09-14 ENCOUNTER — Telehealth (HOSPITAL_COMMUNITY): Payer: Self-pay

## 2019-09-14 ENCOUNTER — Telehealth: Payer: Self-pay | Admitting: Nurse Practitioner

## 2019-09-14 ENCOUNTER — Other Ambulatory Visit: Payer: Self-pay | Admitting: Neurosurgery

## 2019-09-14 DIAGNOSIS — M4316 Spondylolisthesis, lumbar region: Secondary | ICD-10-CM

## 2019-09-14 NOTE — Telephone Encounter (Signed)
Spoke to [place] to confirm appointment for May 5th 1000.  No foods 4 hours prior to procedure. She can take meds and drink fluids prior to procedure.   Meds held for 2 days procedure: -Wellbutrin -Celexa -Cymbalta -Mirtazapine

## 2019-09-14 NOTE — Telephone Encounter (Signed)
Phone call to patient to verify medication list and allergies for myelogram procedure. Pt instructed to hold Wellbutrin, Celexa, Cymbalta, Mirtazapine for 48hrs prior to myelogram appointment time. Pt verbalized understanding. Pre and post procedure instructions reviewed with pt.

## 2019-09-18 ENCOUNTER — Ambulatory Visit (INDEPENDENT_AMBULATORY_CARE_PROVIDER_SITE_OTHER): Payer: Medicare HMO

## 2019-09-18 DIAGNOSIS — Z95810 Presence of automatic (implantable) cardiac defibrillator: Secondary | ICD-10-CM | POA: Diagnosis not present

## 2019-09-18 DIAGNOSIS — I5022 Chronic systolic (congestive) heart failure: Secondary | ICD-10-CM

## 2019-09-18 NOTE — Progress Notes (Signed)
EPIC Encounter for ICM Monitoring  Patient Name: Rhonda [name] is a 69 y.o. female Date: 09/18/2019 Primary Care Physican: Maurice Small, MD Primary Cardiologist:Bensimhon Electrophysiologist: Santina Evans Pacing:99.9% 4/26/2021Weight: 180lbs  Battery ERI approximately 6 months   Spoke with patient and reports feeling well at this time.  Denies fluid symptoms.    Optivol thoracic impedanceclose to baseline normal.  Prescribed: Patient participating in paramedicine program to assist with meds.  Torsemide 20 mg3tablets (60 mg total)by mouthtwice a day.  Potassium 20 meq1tablet  (20 mEq total)twice a day.   Metolazone 2.5mg  1 tablet by mouth as directed by CHF clinic  Labs: 06/07/2019 Creatinine 1.57, BUN 22, Potassium 3.6, Sodium 142, GFR 34-39 03/30/2019 Creatinine 1.68, BUN 24, Potassium 3.6, Sodium 139, GFR 31-36 01/11/2019 Creatinine 1.84, BUN 35, Potassium 4.2, Sodium 140, GFR 28-33 A complete set of results can be found in Results Review.  Recommendations:No changes and encouraged to call if experiencing any fluid symptoms.   Follow-up plan: ICM clinic phone appointment on5/27/2021. 91 day device clinic remote transmission 10/18/2019.   Copy of ICM check sent to Dr.Taylor.  3 month ICM trend: 09/18/2019    1 Year ICM trend:       [name], RN 09/18/2019 4:46 PM

## 2019-09-19 ENCOUNTER — Other Ambulatory Visit (HOSPITAL_COMMUNITY): Payer: Self-pay

## 2019-09-19 NOTE — Progress Notes (Signed)
Paramedicine Encounter    Patient ID: Rhonda [name], female    DOB: 09-Sep-1950, 69 y.o.   MRN: [id number]   Patient Care Team: Maurice Small, MD as PCP - General (Family Medicine) [name], LCSW as Social Worker (Licensed Clinical Social Worker) [name], [name], DO as Consulting Physician (Neurology)  Patient Active Problem List   Diagnosis Date Noted  . Hypokalemia 11/08/2018  . CKD (chronic kidney disease) stage 3, GFR 30-59 ml/min 11/08/2018  . Orthostatic hypotension 07/28/2017  . SVD (spontaneous vaginal delivery)   . Peripheral neuropathy   . On home oxygen therapy   . Migraines   . Left bundle branch block   . Hypothyroidism   . Hypertension   . Hyperlipidemia   . Heart murmur   . GERD (gastroesophageal reflux disease)   . Exertional shortness of breath   . Major depressive disorder   . Back pain   . Arthritis   . Generalized anxiety disorder   . Anemia   . Chronic respiratory failure ([place]) 09/14/2013  . 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 Morbid obesity ([place]) 11/01/2012  . Chronic systolic heart failure ([place]) 10/27/2012  . Endometrial polyp 01/20/2012  . Unspecified vitamin D deficiency 03/26/2011    Current Outpatient Medications:  .  acetaZOLAMIDE (DIAMOX) 125 MG tablet, TAKE 1 TABLET(125 MG) BY MOUTH DAILY, Disp: 90 tablet, Rfl: 3 .  carboxymethylcellulose (REFRESH PLUS) 0.5 % SOLN, Place 1 drop into both eyes 3 (three) times daily as needed (dry eyes)., Disp: , Rfl:  .  carvedilol (COREG) 3.125 MG tablet, TAKE 1 TABLET(3.125 MG) BY MOUTH TWICE DAILY WITH A MEAL, Disp: 180 tablet, Rfl: 3 .  citalopram (CELEXA) 20 MG tablet, Take 20 mg by mouth daily., Disp: , Rfl:  .  clonazePAM (KLONOPIN) 0.5 MG tablet, Take 0.5 mg by mouth 3 (three) times daily as needed for anxiety. , Disp: , Rfl:  .  DULoxetine (CYMBALTA) 30 MG capsule, Take 30 mg by mouth daily. Take once in the morning., Disp: , Rfl:  .  fexofenadine  (ALLEGRA) 180 MG tablet, Take 180 mg by mouth 2 (two) times daily., Disp: , Rfl:  .  fludrocortisone (FLORINEF) 0.1 MG tablet, TAKE 1 TABLET(0.1 MG) BY MOUTH TWICE DAILY, Disp: 60 tablet, Rfl: 3 .  gabapentin (NEURONTIN) 300 MG capsule, Take 300 mg by mouth 3 (three) times daily. , Disp: , Rfl:  .  gabapentin (NEURONTIN) 800 MG tablet, Take 800 mg by mouth at bedtime as needed (neuropathy). , Disp: , Rfl:  .  ipratropium (ATROVENT) 0.03 % nasal spray, Place 2 sprays into both nostrils 2 (two) times daily., Disp: , Rfl:  .  levothyroxine (SYNTHROID) 88 MCG tablet, TAKE 1 TABLET(88 MCG) BY MOUTH DAILY BEFORE BREAKFAST, Disp: 90 tablet, Rfl: 1 .  midodrine (PROAMATINE) 10 MG tablet, Take 1 tablet (10 mg total) by mouth 3 (three) times daily with meals., Disp: 270 tablet, Rfl: 3 .  mirtazapine (REMERON) 30 MG tablet, mirtazapine 30 mg tablet, Disp: , Rfl:  .  montelukast (SINGULAIR) 10 MG tablet, Take 10 mg by mouth at bedtime. , Disp: , Rfl: 1 .  potassium chloride SA (KLOR-CON) 20 MEQ tablet, Take 1 tablet (20 mEq total) by mouth 2 (two) times daily., Disp: 180 tablet, Rfl: 3 .  probenecid (BENEMID) 500 MG tablet, Take 500 mg by mouth 2 (two) times daily., Disp: , Rfl:  .  simvastatin (ZOCOR) 10 MG tablet, Take 10 mg by mouth every evening. ,  Disp: , Rfl:  .  spironolactone (ALDACTONE) 25 MG tablet, Take 0.5 tablets (12.5 mg total) by mouth every evening., Disp: 45 tablet, Rfl: 3 .  torsemide (DEMADEX) 20 MG tablet, Take 3 tablets (60 mg total) by mouth 2 (two) times daily., Disp: 540 tablet, Rfl: 3 .  buPROPion (WELLBUTRIN XL) 150 MG 24 hr tablet, Take 450 mg by mouth every morning. , Disp: , Rfl:  .  HYDROcodone-acetaminophen (NORCO) 10-325 MG tablet, Take 1 tablet by mouth every 6 (six) hours as needed., Disp: , Rfl:  .  metolazone (ZAROXOLYN) 2.5 MG tablet, Take only as directed by CHF clinic (Patient not taking: Reported on 09/19/2019), Disp: 10 tablet, Rfl: 3 .  Nabumetone (RELAFEN PO),  nabumetone, Disp: , Rfl:  .  zolpidem (AMBIEN) 10 MG tablet, Take 10 mg by mouth at bedtime as needed for sleep. For sleep , Disp: , Rfl:  Allergies  Allergen Reactions  . Ceftin Anaphylaxis    Face and throat swell   . Shellfish Allergy Other (See Comments)    Gout exacerbation  . Geodon [Ziprasidone Hcl] Hives  . Lisinopril Other (See Comments)    angioedema  . Allopurinol Nausea Only and Other (See Comments)    weakness  . Ativan [Lorazepam] Itching  . Sulfa Antibiotics Itching  . Ultram [Tramadol Hcl] Itching     Social History   Socioeconomic History  . Marital status: Married    Spouse name: Not on file  . Number of children: 2  . Years of education: 69  . Highest education level: Master's degree (e.g., MA, MS, MEng, MEd, MSW, MBA)  Occupational History  . Occupation: retired  Tobacco Use  . Smoking status: Former Smoker    Packs/day: 0.10    Years: 26.00    Pack years: 2.60    Types: Cigarettes    Quit date: 05/06/2007    Years since quitting: 12.3  . Smokeless tobacco: Never Used  Substance and Sexual Activity  . Alcohol use: No  . Drug use: No  . Sexual activity: Yes  Other Topics Concern  . 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 Difficulty of Paying Living Expenses:   Food Insecurity:   . Worried About Charity fundraiser in the Last Year:   . Arboriculturist in the Last Year:   Transportation Needs:   . Film/video editor (Medical):   Marland Kitchen Lack of Transportation (Non-Medical):   Physical Activity:   . Days of Exercise per Week:   . Minutes of Exercise per Session:    Stress:   . Feeling of Stress :   Social Connections:   . Frequency of Communication with Friends and Family:   . Frequency of Social Gatherings with Friends and Family:   . Attends Religious Services:   . Active Member of Clubs or Organizations:   . Attends Archivist Meetings:   Marland Kitchen Marital Status:   Intimate Partner Violence:   . Fear of Current or Ex-Partner:   .  Emotionally Abused:   Marland Kitchen Physically Abused:   . Sexually Abused:     Physical Exam Vitals reviewed.  HENT:     Head: Normocephalic.     Nose: Nose normal.     Mouth/Throat:     Mouth: Mucous membranes are moist.     Pharynx: Oropharynx is clear.  Eyes:     Pupils: Pupils are equal, round, and reactive to light.  Cardiovascular:     Rate and Rhythm: Normal rate and regular rhythm.     Pulses: Normal pulses.     Heart sounds: Normal heart sounds.  Pulmonary:     Effort: Pulmonary effort is normal.     Breath sounds: Normal breath sounds.  Abdominal:     Palpations: Abdomen is soft.  Musculoskeletal:        General: Swelling present. Normal range of motion.     Cervical back: Normal range of motion and neck supple.     Right lower leg: Edema present.     Left lower leg: Edema present.  Skin:    General: Skin is warm and dry.     Capillary Refill: Capillary refill takes less than 2 seconds.  Neurological:     Mental Status: She is alert. Mental status is at baseline.  Psychiatric:        Mood and Affect: Mood normal.   Arrived for home visit for Mikaylah who was ambulating around her house without respiratory distress stating she feels better this week than last week but is still having some general pain in her neck. I instructed patient to take prescribed medication for pain, she did so and stated it helped. Vitals were obtained. Weight down 11lbs from last week which is an improvement. Legs still swollen with some edema but improvement from last week. Medications were reviewed, patient now using  [facility]. Meds were verified with their pharmacy and I will be messaging provider for meds not sent over from [facility]. Pill box filled accordingly.   For upcoming procedure next week Monday Tuesday Weds meds held in pill box instructed by [facility]: -Cymbalta -Celexa -Mirtazapine   Patient had no other complaints and agreed to visit in one week. Home visit complete.   Refills: NONE   Weight- 192lbs  Weight last week- 201lbs      Future Appointments  Date Time Provider [place]  09/27/2019 10:30 AM GI-315 DG C-ARM RM 3 GI-315DG GI-315 W. WE  09/27/2019 11:00 AM GI-315 CT 1 GI-315CT GI-315 W. WE  09/27/2019 11:20 AM GI-315 CT 1 GI-315CT GI-315 W. WE  09/27/2019 11:40 AM GI-315 CT 1 GI-315CT GI-315 W. WE  10/18/2019  7:30 AM [facility] DEVICE REMOTES [facility]  11/08/2019 12:40 PM Bensimhon, Shaune Pascal, MD [facility] None  01/17/2020  7:30 AM [facility] DEVICE REMOTES [facility]  04/17/2020  7:30 AM [facility] DEVICE REMOTES [facility]  06/12/2020 10:00 AM Philemon Kingdom, MD [facility] None     ACTION: Home visit completed Next visit planned for one week

## 2019-09-21 DIAGNOSIS — F3342 Major depressive disorder, recurrent, in full remission: Secondary | ICD-10-CM | POA: Diagnosis not present

## 2019-09-21 DIAGNOSIS — F0632 Mood disorder due to known physiological condition with major depressive-like episode: Secondary | ICD-10-CM | POA: Diagnosis not present

## 2019-09-26 ENCOUNTER — Telehealth (HOSPITAL_COMMUNITY): Payer: Self-pay | Admitting: *Deleted

## 2019-09-26 ENCOUNTER — Other Ambulatory Visit (HOSPITAL_COMMUNITY): Payer: Self-pay

## 2019-09-26 ENCOUNTER — Telehealth (HOSPITAL_COMMUNITY): Payer: Self-pay | Admitting: Licensed Clinical Social Worker

## 2019-09-26 NOTE — Telephone Encounter (Signed)
Patient identified as a candidate to receive 7 heart healthy meals per week for 4 weeks through [facility].  Completed referral sent in for review.  Anticipate patient will receive first shipment of food in 1-3 business days.  Tyann Niehaus H. Romesha Scherer, LCSW Clinical Social Worker [facility] Desk#: [phone] Cell#: [phone]  

## 2019-09-26 NOTE — Progress Notes (Signed)
Paramedicine Encounter    Patient ID: Rhonda [name], female    DOB: 18-Oct-1950, 69 y.o.   MRN: [id number]   Patient Care Team: Maurice Small, MD as PCP - General (Family Medicine) [name], LCSW as Social Worker (Licensed Clinical Social Worker) [name], [name], DO as Consulting Physician (Neurology)  Patient Active Problem List   Diagnosis Date Noted  . Hypokalemia 11/08/2018  . CKD (chronic kidney disease) stage 3, GFR 30-59 ml/min 11/08/2018  . Orthostatic hypotension 07/28/2017  . SVD (spontaneous vaginal delivery)   . Peripheral neuropathy   . On home oxygen therapy   . Migraines   . Left bundle branch block   . Hypothyroidism   . Hypertension   . Hyperlipidemia   . Heart murmur   . GERD (gastroesophageal reflux disease)   . Exertional shortness of breath   . Major depressive disorder   . Back pain   . Arthritis   . Generalized anxiety disorder   . Anemia   . Chronic respiratory failure ([place]) 09/14/2013  . 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 Morbid obesity ([place]) 11/01/2012  . Chronic systolic heart failure ([place]) 10/27/2012  . Endometrial polyp 01/20/2012  . Unspecified vitamin D deficiency 03/26/2011    Current Outpatient Medications:  .  acetaZOLAMIDE (DIAMOX) 125 MG tablet, TAKE 1 TABLET(125 MG) BY MOUTH DAILY, Disp: 90 tablet, Rfl: 3 .  carvedilol (COREG) 3.125 MG tablet, TAKE 1 TABLET(3.125 MG) BY MOUTH TWICE DAILY WITH A MEAL, Disp: 180 tablet, Rfl: 3 .  citalopram (CELEXA) 20 MG tablet, Take 20 mg by mouth daily., Disp: , Rfl:  .  clonazePAM (KLONOPIN) 0.5 MG tablet, Take 0.5 mg by mouth 3 (three) times daily as needed for anxiety. , Disp: , Rfl:  .  DULoxetine (CYMBALTA) 30 MG capsule, Take 30 mg by mouth daily. Take once in the morning., Disp: , Rfl:  .  fludrocortisone (FLORINEF) 0.1 MG tablet, TAKE 1 TABLET(0.1 MG) BY MOUTH TWICE DAILY, Disp: 60 tablet, Rfl: 3 .  gabapentin (NEURONTIN) 300 MG capsule, Take  300 mg by mouth 3 (three) times daily. , Disp: , Rfl:  .  gabapentin (NEURONTIN) 800 MG tablet, Take 800 mg by mouth at bedtime as needed (neuropathy). , Disp: , Rfl:  .  HYDROcodone-acetaminophen (NORCO) 10-325 MG tablet, Take 1 tablet by mouth every 6 (six) hours as needed., Disp: , Rfl:  .  ipratropium (ATROVENT) 0.03 % nasal spray, Place 2 sprays into both nostrils 2 (two) times daily., Disp: , Rfl:  .  levothyroxine (SYNTHROID) 88 MCG tablet, TAKE 1 TABLET(88 MCG) BY MOUTH DAILY BEFORE BREAKFAST, Disp: 90 tablet, Rfl: 1 .  midodrine (PROAMATINE) 10 MG tablet, Take 1 tablet (10 mg total) by mouth 3 (three) times daily with meals., Disp: 270 tablet, Rfl: 3 .  mirtazapine (REMERON) 30 MG tablet, mirtazapine 30 mg tablet, Disp: , Rfl:  .  montelukast (SINGULAIR) 10 MG tablet, Take 10 mg by mouth at bedtime. , Disp: , Rfl: 1 .  potassium chloride SA (KLOR-CON) 20 MEQ tablet, Take 1 tablet (20 mEq total) by mouth 2 (two) times daily., Disp: 180 tablet, Rfl: 3 .  probenecid (BENEMID) 500 MG tablet, Take 500 mg by mouth 2 (two) times daily., Disp: , Rfl:  .  simvastatin (ZOCOR) 10 MG tablet, Take 10 mg by mouth every evening. , Disp: , Rfl:  .  spironolactone (ALDACTONE) 25 MG tablet, Take 0.5 tablets (12.5 mg total) by mouth every evening., Disp: 45  tablet, Rfl: 3 .  torsemide (DEMADEX) 20 MG tablet, Take 3 tablets (60 mg total) by mouth 2 (two) times daily., Disp: 540 tablet, Rfl: 3 .  buPROPion (WELLBUTRIN XL) 150 MG 24 hr tablet, Take 450 mg by mouth every morning. , Disp: , Rfl:  .  carboxymethylcellulose (REFRESH PLUS) 0.5 % SOLN, Place 1 drop into both eyes 3 (three) times daily as needed (dry eyes)., Disp: , Rfl:  .  fexofenadine (ALLEGRA) 180 MG tablet, Take 180 mg by mouth 2 (two) times daily., Disp: , Rfl:  .  metolazone (ZAROXOLYN) 2.5 MG tablet, Take only as directed by CHF clinic (Patient not taking: Reported on 09/19/2019), Disp: 10 tablet, Rfl: 3 .  Nabumetone (RELAFEN PO), nabumetone,  Disp: , Rfl:  .  zolpidem (AMBIEN) 10 MG tablet, Take 10 mg by mouth at bedtime as needed for sleep. For sleep , Disp: , Rfl:  Allergies  Allergen Reactions  . Ceftin Anaphylaxis    Face and throat swell   . Shellfish Allergy Other (See Comments)    Gout exacerbation  . Geodon [Ziprasidone Hcl] Hives  . Lisinopril Other (See Comments)    angioedema  . Allopurinol Nausea Only and Other (See Comments)    weakness  . Ativan [Lorazepam] Itching  . Sulfa Antibiotics Itching  . Ultram [Tramadol Hcl] Itching     Social History   Socioeconomic History  . Marital status: Married    Spouse name: Not on file  . Number of children: 2  . Years of education: 32  . Highest education level: Master's degree (e.g., MA, MS, MEng, MEd, MSW, MBA)  Occupational History  . Occupation: retired  Tobacco Use  . Smoking status: Former Smoker    Packs/day: 0.10    Years: 26.00    Pack years: 2.60    Types: Cigarettes    Quit date: 05/06/2007    Years since quitting: 12.4  . Smokeless tobacco: Never Used  Substance and Sexual Activity  . Alcohol use: No  . Drug use: No  . Sexual activity: Yes  Other Topics Concern  . 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 Difficulty of Paying Living Expenses:   Food Insecurity:   . Worried About Charity fundraiser in the Last Year:   . Arboriculturist in the Last Year:   Transportation Needs:   . Film/video editor (Medical):   Marland Kitchen Lack of Transportation (Non-Medical):   Physical Activity:   . Days of Exercise per Week:   . Minutes of Exercise per Session:   Stress:   .  Feeling of Stress :   Social Connections:   . Frequency of Communication with Friends and Family:   . Frequency of Social Gatherings with Friends and Family:   . Attends Religious Services:   . Active Member of Clubs or Organizations:   . Attends Archivist Meetings:   Marland Kitchen Marital Status:   Intimate Partner Violence:   . Fear of Current or Ex-Partner:   .  Emotionally Abused:   Marland Kitchen Physically Abused:   . Sexually Abused:     Physical Exam Vitals reviewed.  Constitutional:      Appearance: Rhonda [name] is obese.  HENT:     Head: Normocephalic.     Nose: Nose normal.     Mouth/Throat:     Mouth: Mucous membranes are moist.     Pharynx: Oropharynx is clear.  Eyes:     Pupils: Pupils are equal, round, and reactive to light.  Cardiovascular:     Rate and Rhythm: Normal rate and regular rhythm.     Pulses: Normal pulses.     Heart sounds: Normal heart sounds.  Pulmonary:     Effort: Pulmonary effort is normal.     Breath sounds: Normal breath sounds.  Abdominal:     General: Abdomen is flat. There is no distension.     Palpations: Abdomen is soft.  Musculoskeletal:        General: Swelling present. Normal range of motion.     Cervical back: Normal range of motion.     Right lower leg: Edema present.     Left lower leg: Edema present.  Skin:    General: Skin is warm and dry.     Capillary Refill: Capillary refill takes less than 2 seconds.  Neurological:     Mental Status: Rhonda [name] is alert. Mental status is at baseline.  Psychiatric:        Mood and Affect: Mood normal.     Arrived for home visit for Asijah who was ambulating around her home without assistance and no obvious shortness of breath. Rhonda [name] reports having an increase in her weight. 11lbs up since last Tuesday. Vitals were obtained. Legs noted to be swollen and edema noted. Rhonda [name] reports eating spaghetti, ice cream, pizza and 32oz of water a day.Patient denied shortness of breath or dizziness. Rhonda [name] stated  her chest, neck and shoulder still hurt from her fall several weeks ago. I reported same to HF clinic and they advised 2.5mg  Metolzone with 40MeQ of Potassium today. Rhonda [name] took same at 1130am. Medications were reviewed and confirmed. Pill box was filled accordingly. Rhonda [name] has CT scan and procedures tomorrow. Rhonda [name] agreed to visit in one week. I will follow up on patient post procedure tomorrow. Home visit complete.    Refills: Fexofenadine (none in pill box)  I will patient in one week.       Future Appointments  Date Time Provider [place]  09/27/2019 10:30 AM GI-315 DG C-ARM RM 3 GI-315DG GI-315 W. WE  09/27/2019 11:00 AM GI-315 CT 1 GI-315CT GI-315 W. WE  09/27/2019 11:20 AM GI-315 CT 1 GI-315CT GI-315 W. WE  09/27/2019 11:40 AM GI-315 CT 1 GI-315CT GI-315 W. WE  10/18/2019  7:30 AM [facility] DEVICE REMOTES [facility]  11/08/2019 12:40 PM Bensimhon, Shaune Pascal, MD [facility] None  01/17/2020  7:30 AM [facility] DEVICE REMOTES [facility]  04/17/2020  7:30 AM [facility] DEVICE REMOTES [facility]  06/12/2020 10:00 AM Philemon Kingdom, MD [facility] None     ACTION: Home visit completed Next visit planned for one week

## 2019-09-26 NOTE — Telephone Encounter (Signed)
Rhonda [name] with paramedicine called to report pt had an 11lb weight gain x one week. Pt has BLEE. No chest pain or shortness of breath. Per Dr.Bensimhon have her take metolazone 2.5mg  daily with 15meq of K. Rhonda [name] aware and instructed pt.

## 2019-09-27 ENCOUNTER — Ambulatory Visit
Admission: RE | Admit: 2019-09-27 | Discharge: 2019-09-27 | Disposition: A | Discharge status: Home / Self Care | Payer: Medicare HMO | Source: Ambulatory Visit | Attending: Neurosurgery | Admitting: Neurosurgery

## 2019-09-27 ENCOUNTER — Other Ambulatory Visit: Payer: Self-pay

## 2019-09-27 DIAGNOSIS — M4316 Spondylolisthesis, lumbar region: Secondary | ICD-10-CM

## 2019-09-27 DIAGNOSIS — M48061 Spinal stenosis, lumbar region without neurogenic claudication: Secondary | ICD-10-CM | POA: Diagnosis not present

## 2019-09-27 DIAGNOSIS — M5116 Intervertebral disc disorders with radiculopathy, lumbar region: Secondary | ICD-10-CM | POA: Diagnosis not present

## 2019-09-27 DIAGNOSIS — M5124 Other intervertebral disc displacement, thoracic region: Secondary | ICD-10-CM | POA: Diagnosis not present

## 2019-09-27 DIAGNOSIS — M50123 Cervical disc disorder at C6-C7 level with radiculopathy: Secondary | ICD-10-CM | POA: Diagnosis not present

## 2019-09-27 DIAGNOSIS — M4802 Spinal stenosis, cervical region: Secondary | ICD-10-CM | POA: Diagnosis not present

## 2019-09-27 MED ORDER — DIAZEPAM 5 MG PO TABS
5.0000 mg | ORAL_TABLET | Freq: Once | ORAL | Status: AC
  Administered 2019-09-27: 5 mg via ORAL

## 2019-09-27 MED ORDER — IOPAMIDOL (ISOVUE-M 300) INJECTION 61%
10.0000 mL | Freq: Once | INTRAMUSCULAR | Status: AC | PRN
  Administered 2019-09-27: 10 mL via INTRATHECAL

## 2019-09-27 MED ORDER — ONDANSETRON HCL 4 MG/2ML IJ SOLN
4.0000 mg | Freq: Once | INTRAMUSCULAR | Status: AC
  Administered 2019-09-27: 4 mg via INTRAMUSCULAR

## 2019-09-27 MED ORDER — MEPERIDINE HCL 50 MG/ML IJ SOLN
50.0000 mg | Freq: Once | INTRAMUSCULAR | Status: AC
  Administered 2019-09-27: 50 mg via INTRAMUSCULAR

## 2019-09-27 NOTE — Progress Notes (Signed)
Patient states she has been off Celexa, Cymbalta, Mirtazapine and Wellbutrin for at least the past two days. Verified by husband who is present only to hear the discharge instructions and who will then go out to the car for the duration of the total myelogram when patient goes across the hall for the procedure.

## 2019-09-27 NOTE — Discharge Instructions (Signed)
Myelogram Discharge Instructions  1. Go home and rest quietly for the next 24 hours.  It is important to lie flat for the next 24 hours.  Get up only to go to the restroom.  You may lie in the bed or on a couch on your back, your stomach, your left side or your right side.  You may have one pillow under your head.  You may have pillows between your knees while you are on your side or under your knees while you are on your back.  2. DO NOT drive today.  Recline the seat as far back as it will go, while still wearing your seat belt, on the way home.  3. You may get up to go to the bathroom as needed.  You may sit up for 10 minutes to eat.  You may resume your normal diet and medications unless otherwise indicated.  Drink plenty of extra fluids today and tomorrow.  4. The incidence of a spinal headache with nausea and/or vomiting is about 5% (one in 20 patients).  If you develop a headache, lie flat and drink plenty of fluids until the headache goes away.  Caffeinated beverages may be helpful.  If you develop severe nausea and vomiting or a headache that does not go away with flat bed rest, call [phone].  5. You may resume normal activities after your 24 hours of bed rest is over; however, do not exert yourself strongly or do any heavy lifting tomorrow.  6. Call your physician for a follow-up appointment.    You may resume Celexa, Cymbalta, Mirtazapine and Wellbutrin on Thursday, Sep 28, 2019 after 10:30a.m.

## 2019-10-03 ENCOUNTER — Other Ambulatory Visit (HOSPITAL_COMMUNITY): Payer: Self-pay

## 2019-10-03 NOTE — Progress Notes (Signed)
Paramedicine Encounter    Patient ID: Rhonda [name], female    DOB: 02-06-1951, 69 y.o.   MRN: [id number]   Patient Care Team: Maurice Small, MD as PCP - General (Family Medicine) [name], LCSW as Social Worker (Licensed Clinical Social Worker) [name], [name], DO as Consulting Physician (Neurology)  Patient Active Problem List   Diagnosis Date Noted  . Diabetes mellitus ([place]) 06/15/2019  . Hypokalemia 11/08/2018  . CKD (chronic kidney disease) stage 3, GFR 30-59 ml/min 11/08/2018  . Orthostatic hypotension 07/28/2017  . SVD (spontaneous vaginal delivery)   . Peripheral neuropathy   . On home oxygen therapy   . Migraines   . Left bundle branch block   . Hypothyroidism   . Hypertension   . Hyperlipidemia   . Heart murmur   . GERD (gastroesophageal reflux disease)   . Exertional shortness of breath   . Major depressive disorder   . Back pain   . Arthritis   . Generalized anxiety disorder   . Anemia   . Chronic respiratory failure ([place]) 09/14/2013  . 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 Morbid obesity ([place]) 11/01/2012  . Chronic systolic heart failure ([place]) 10/27/2012  . Endometrial polyp 01/20/2012  . Malignant tumor of breast ([place]) 03/26/2011  . Unspecified vitamin D deficiency 03/26/2011    Current Outpatient Medications:  .  acetaZOLAMIDE (DIAMOX) 125 MG tablet, TAKE 1 TABLET(125 MG) BY MOUTH DAILY, Disp: 90 tablet, Rfl: 3 .  carvedilol (COREG) 3.125 MG tablet, TAKE 1 TABLET(3.125 MG) BY MOUTH TWICE DAILY WITH A MEAL, Disp: 180 tablet, Rfl: 3 .  citalopram (CELEXA) 20 MG tablet, Take 20 mg by mouth daily., Disp: , Rfl:  .  clonazePAM (KLONOPIN) 0.5 MG tablet, Take 0.5 mg by mouth 3 (three) times daily as needed for anxiety. , Disp: , Rfl:  .  DULoxetine (CYMBALTA) 30 MG capsule, Take 30 mg by mouth daily. Take once in the morning., Disp: , Rfl:  .  fludrocortisone (FLORINEF) 0.1 MG tablet, TAKE 1 TABLET(0.1 MG) BY MOUTH  TWICE DAILY, Disp: 60 tablet, Rfl: 3 .  gabapentin (NEURONTIN) 300 MG capsule, Take 300 mg by mouth 3 (three) times daily. , Disp: , Rfl:  .  gabapentin (NEURONTIN) 800 MG tablet, Take 800 mg by mouth at bedtime as needed (neuropathy). , Disp: , Rfl:  .  ipratropium (ATROVENT) 0.03 % nasal spray, Place 2 sprays into both nostrils 2 (two) times daily., Disp: , Rfl:  .  levothyroxine (SYNTHROID) 88 MCG tablet, TAKE 1 TABLET(88 MCG) BY MOUTH DAILY BEFORE BREAKFAST, Disp: 90 tablet, Rfl: 1 .  midodrine (PROAMATINE) 10 MG tablet, Take 1 tablet (10 mg total) by mouth 3 (three) times daily with meals., Disp: 270 tablet, Rfl: 3 .  mirtazapine (REMERON) 30 MG tablet, mirtazapine 30 mg tablet, Disp: , Rfl:  .  montelukast (SINGULAIR) 10 MG tablet, Take 10 mg by mouth at bedtime. , Disp: , Rfl: 1 .  potassium chloride SA (KLOR-CON) 20 MEQ tablet, Take 1 tablet (20 mEq total) by mouth 2 (two) times daily., Disp: 180 tablet, Rfl: 3 .  probenecid (BENEMID) 500 MG tablet, Take 500 mg by mouth 2 (two) times daily., Disp: , Rfl:  .  simvastatin (ZOCOR) 10 MG tablet, Take 10 mg by mouth every evening. , Disp: , Rfl:  .  spironolactone (ALDACTONE) 25 MG tablet, Take 0.5 tablets (12.5 mg total) by mouth every evening., Disp: 45 tablet, Rfl: 3 .  torsemide (DEMADEX) 20  MG tablet, Take 3 tablets (60 mg total) by mouth 2 (two) times daily., Disp: 540 tablet, Rfl: 3 .  buPROPion (WELLBUTRIN XL) 150 MG 24 hr tablet, Take 450 mg by mouth every morning. , Disp: , Rfl:  .  carboxymethylcellulose (REFRESH PLUS) 0.5 % SOLN, Place 1 drop into both eyes 3 (three) times daily as needed (dry eyes)., Disp: , Rfl:  .  febuxostat (ULORIC) 40 MG tablet, 40 mg daily., Disp: , Rfl:  .  fexofenadine (ALLEGRA) 180 MG tablet, Take 180 mg by mouth 2 (two) times daily., Disp: , Rfl:  .  HYDROcodone-acetaminophen (NORCO) 10-325 MG tablet, Take 1 tablet by mouth every 6 (six) hours as needed., Disp: , Rfl:  .  metolazone (ZAROXOLYN) 2.5 MG  tablet, Take only as directed by CHF clinic (Patient not taking: Reported on 10/03/2019), Disp: 10 tablet, Rfl: 3 .  Nabumetone (RELAFEN PO), nabumetone, Disp: , Rfl:  .  ondansetron (ZOFRAN) 8 MG tablet, , Disp: , Rfl:  .  zolpidem (AMBIEN) 10 MG tablet, Take 10 mg by mouth at bedtime as needed for sleep. For sleep , Disp: , Rfl:  Allergies  Allergen Reactions  . Ceftin Anaphylaxis    Face and throat swell   . Geodon [Ziprasidone Hcl] Hives  . Lisinopril Other (See Comments)    angioedema  . Shellfish Allergy Other (See Comments)    Gout exacerbation  . Allopurinol Nausea Only and Other (See Comments)    weakness  . Ativan [Lorazepam] Itching  . Sulfa Antibiotics Itching  . Ultram [Tramadol Hcl] Itching  . Valium [Diazepam] Other (See Comments)    Patient states that diazepam doesn't relax, it has the opposite effect.      Social History   Socioeconomic History  . Marital status: Married    Spouse name: Not on file  . Number of children: 2  . Years of education: 83  . Highest education level: Master's degree (e.g., MA, MS, MEng, MEd, MSW, MBA)  Occupational History  . Occupation: retired  Tobacco Use  . Smoking status: Former Smoker    Packs/day: 0.10    Years: 26.00    Pack years: 2.60    Types: Cigarettes    Quit date: 05/06/2007    Years since quitting: 12.4  . Smokeless tobacco: Never Used  Substance and Sexual Activity  . Alcohol use: No  . Drug use: No  . Sexual activity: Yes  Other Topics Concern  . 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 Difficulty of Paying Living Expenses:    Food Insecurity:   . Worried About Charity fundraiser in the Last Year:   . Arboriculturist in the Last Year:   Transportation Needs:   . Film/video editor (Medical):   Marland Kitchen Lack of Transportation (Non-Medical):   Physical Activity:   . Days of Exercise per Week:   . Minutes of Exercise per Session:   Stress:   . Feeling of Stress :   Social Connections:   .  Frequency of Communication with Friends and Family:   . Frequency of Social Gatherings with Friends and Family:   . Attends Religious Services:   . Active Member of Clubs or Organizations:   . Attends Archivist Meetings:   Marland Kitchen Marital Status:   Intimate Partner Violence:   . Fear of Current or Ex-Partner:   . Emotionally Abused:   Marland Kitchen Physically Abused:   . Sexually Abused:     Physical Exam Cardiovascular:     Rate and Rhythm: Normal rate and regular rhythm.  Pulmonary:     Effort: Pulmonary effort is normal.  Musculoskeletal:        General: Normal range of motion.  Skin:    General: Skin is warm and dry.     Capillary Refill: Capillary refill takes less than 2 seconds.  Neurological:     Mental Status: She is oriented to person, place, and time.  Psychiatric:        Mood and Affect: Mood normal.         Future Appointments  Date Time Provider [place]  10/18/2019  7:30 AM [facility] DEVICE REMOTES [facility]  11/08/2019 12:40 PM Bensimhon, Shaune Pascal, MD [facility] None  01/17/2020  7:30 AM [facility] DEVICE REMOTES [facility]  04/17/2020  7:30 AM [facility] DEVICE [facility]OTES [facility]  06/12/2020 10:00 AM Philemon Kingdom, MD [facility] None    BP 130/74 (BP Location: Left Arm, Patient Position: Sitting, Cuff Size: Normal)   Pulse 95   Resp 18   Wt 194 lb (88 kg)   SpO2 99%   BMI 36.66 kg/m   Weight yesterday- 192.2 lb Last visit weight- 203 lb  Ms Brumit was seen at home today and reported feeling well. She denied chest pain, SOB,  headache, dizziness, orthopnea, fever or cough since her last visit with [name]. She stated she has been compliant with her medications and her weight has been trending down. Her medications were verified and her pillbox was refilled. [name] will follow up next week.   Jacquiline Doe, EMT 10/03/19  ACTION: Home visit completed Next visit planned for 1 week

## 2019-10-05 DIAGNOSIS — M48061 Spinal stenosis, lumbar region without neurogenic claudication: Secondary | ICD-10-CM | POA: Diagnosis not present

## 2019-10-05 DIAGNOSIS — E119 Type 2 diabetes mellitus without complications: Secondary | ICD-10-CM | POA: Diagnosis not present

## 2019-10-05 DIAGNOSIS — J449 Chronic obstructive pulmonary disease, unspecified: Secondary | ICD-10-CM | POA: Diagnosis not present

## 2019-10-06 DIAGNOSIS — M48061 Spinal stenosis, lumbar region without neurogenic claudication: Secondary | ICD-10-CM | POA: Diagnosis not present

## 2019-10-06 DIAGNOSIS — M4316 Spondylolisthesis, lumbar region: Secondary | ICD-10-CM | POA: Diagnosis not present

## 2019-10-10 ENCOUNTER — Other Ambulatory Visit (HOSPITAL_COMMUNITY): Payer: Self-pay

## 2019-10-10 ENCOUNTER — Other Ambulatory Visit (HOSPITAL_COMMUNITY): Payer: Self-pay | Admitting: Internal Medicine

## 2019-10-10 NOTE — Progress Notes (Signed)
Paramedicine Encounter    Patient ID: Rhonda [name], female    DOB: 05-23-1951, 69 y.o.   MRN: [id number]   Patient Care Team: Maurice Small, MD as PCP - General (Family Medicine) [name], LCSW as Social Worker (Licensed Clinical Social Worker) [name], [name], DO as Consulting Physician (Neurology)  Patient Active Problem List   Diagnosis Date Noted  . Diabetes mellitus ([place]) 06/15/2019  . Hypokalemia 11/08/2018  . CKD (chronic kidney disease) stage 3, GFR 30-59 ml/min 11/08/2018  . Orthostatic hypotension 07/28/2017  . SVD (spontaneous vaginal delivery)   . Peripheral neuropathy   . On home oxygen therapy   . Migraines   . Left bundle branch block   . Hypothyroidism   . Hypertension   . Hyperlipidemia   . Heart murmur   . GERD (gastroesophageal reflux disease)   . Exertional shortness of breath   . Major depressive disorder   . Back pain   . Arthritis   . Generalized anxiety disorder   . Anemia   . Chronic respiratory failure ([place]) 09/14/2013  . 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 Morbid obesity ([place]) 11/01/2012  . Chronic systolic heart failure ([place]) 10/27/2012  . Endometrial polyp 01/20/2012  . Malignant tumor of breast ([place]) 03/26/2011  . Unspecified vitamin D deficiency 03/26/2011    Current Outpatient Medications:  .  acetaZOLAMIDE (DIAMOX) 125 MG tablet, TAKE 1 TABLET(125 MG) BY MOUTH DAILY, Disp: 90 tablet, Rfl: 3 .  carboxymethylcellulose (REFRESH PLUS) 0.5 % SOLN, Place 1 drop into both eyes 3 (three) times daily as needed (dry eyes)., Disp: , Rfl:  .  carvedilol (COREG) 3.125 MG tablet, TAKE 1 TABLET(3.125 MG) BY MOUTH TWICE DAILY WITH A MEAL, Disp: 180 tablet, Rfl: 3 .  citalopram (CELEXA) 20 MG tablet, Take 20 mg by mouth daily., Disp: , Rfl:  .  clonazePAM (KLONOPIN) 0.5 MG tablet, Take 0.5 mg by mouth 3 (three) times daily as needed for anxiety. , Disp: , Rfl:  .  DULoxetine (CYMBALTA) 30 MG capsule, Take  30 mg by mouth daily. Take once in the morning., Disp: , Rfl:  .  fludrocortisone (FLORINEF) 0.1 MG tablet, TAKE 1 TABLET(0.1 MG) BY MOUTH TWICE DAILY, Disp: 60 tablet, Rfl: 3 .  gabapentin (NEURONTIN) 300 MG capsule, Take 300 mg by mouth 3 (three) times daily. , Disp: , Rfl:  .  gabapentin (NEURONTIN) 800 MG tablet, Take 800 mg by mouth at bedtime as needed (neuropathy). , Disp: , Rfl:  .  ipratropium (ATROVENT) 0.03 % nasal spray, Place 2 sprays into both nostrils 2 (two) times daily., Disp: , Rfl:  .  levothyroxine (SYNTHROID) 88 MCG tablet, TAKE 1 TABLET(88 MCG) BY MOUTH DAILY BEFORE BREAKFAST, Disp: 90 tablet, Rfl: 1 .  metolazone (ZAROXOLYN) 2.5 MG tablet, Take only as directed by CHF clinic, Disp: 10 tablet, Rfl: 3 .  midodrine (PROAMATINE) 10 MG tablet, Take 1 tablet (10 mg total) by mouth 3 (three) times daily with meals., Disp: 270 tablet, Rfl: 3 .  mirtazapine (REMERON) 30 MG tablet, mirtazapine 30 mg tablet, Disp: , Rfl:  .  montelukast (SINGULAIR) 10 MG tablet, Take 10 mg by mouth at bedtime. , Disp: , Rfl: 1 .  potassium chloride SA (KLOR-CON) 20 MEQ tablet, Take 1 tablet (20 mEq total) by mouth 2 (two) times daily., Disp: 180 tablet, Rfl: 3 .  probenecid (BENEMID) 500 MG tablet, Take 500 mg by mouth 2 (two) times daily., Disp: , Rfl:  .  simvastatin (ZOCOR) 10 MG tablet, Take 10 mg by mouth every evening. , Disp: , Rfl:  .  spironolactone (ALDACTONE) 25 MG tablet, Take 0.5 tablets (12.5 mg total) by mouth every evening., Disp: 45 tablet, Rfl: 3 .  torsemide (DEMADEX) 20 MG tablet, Take 3 tablets (60 mg total) by mouth 2 (two) times daily., Disp: 540 tablet, Rfl: 3 .  buPROPion (WELLBUTRIN XL) 150 MG 24 hr tablet, Take 450 mg by mouth every morning. , Disp: , Rfl:  .  febuxostat (ULORIC) 40 MG tablet, 40 mg daily., Disp: , Rfl:  .  fexofenadine (ALLEGRA) 180 MG tablet, Take 180 mg by mouth 2 (two) times daily., Disp: , Rfl:  .  HYDROcodone-acetaminophen (NORCO) 10-325 MG tablet, Take  1 tablet by mouth every 6 (six) hours as needed., Disp: , Rfl:  .  Nabumetone (RELAFEN PO), nabumetone, Disp: , Rfl:  .  ondansetron (ZOFRAN) 8 MG tablet, , Disp: , Rfl:  .  zolpidem (AMBIEN) 10 MG tablet, Take 10 mg by mouth at bedtime as needed for sleep. For sleep , Disp: , Rfl:  Allergies  Allergen Reactions  . Ceftin Anaphylaxis    Face and throat swell   . Geodon [Ziprasidone Hcl] Hives  . Lisinopril Other (See Comments)    angioedema  . Shellfish Allergy Other (See Comments)    Gout exacerbation  . Allopurinol Nausea Only and Other (See Comments)    weakness  . Ativan [Lorazepam] Itching  . Sulfa Antibiotics Itching  . Ultram [Tramadol Hcl] Itching  . Valium [Diazepam] Other (See Comments)    Patient states that diazepam doesn't relax, it has the opposite effect.     Social History   Socioeconomic History  . Marital status: Married    Spouse name: Not on file  . Number of children: 2  . Years of education: 59  . Highest education level: Master's degree (e.g., MA, MS, MEng, MEd, MSW, MBA)  Occupational History  . Occupation: retired  Tobacco Use  . Smoking status: Former Smoker    Packs/day: 0.10    Years: 26.00    Pack years: 2.60    Types: Cigarettes    Quit date: 05/06/2007    Years since quitting: 12.4  . Smokeless tobacco: Never Used  Substance and Sexual Activity  . Alcohol use: No  . Drug use: No  . Sexual activity: Yes  Other Topics Concern  . 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 Difficulty of Paying Living Expenses:   Food Insecurity:   . Worried About Sales executive in the Last Year:   . Arboriculturist in the Last Year:   Transportation Needs:   . Film/video editor (Medical):   Marland Kitchen Lack of Transportation (Non-Medical):   Physical Activity:   . Days of Exercise per Week:   . Minutes of Exercise per Session:   Stress:   . Feeling of Stress :   Social Connections:   . Frequency of Communication with Friends  and Family:   . Frequency of Social Gatherings with Friends and Family:   . Attends Religious Services:   . Active Member of Clubs or Organizations:   . Attends Archivist Meetings:   Marland Kitchen Marital Status:   Intimate Partner Violence:   . Fear of Current or Ex-Partner:   . Emotionally Abused:   Marland Kitchen Physically Abused:   . Sexually Abused:     Physical Exam Vitals reviewed.  HENT:     Head: Normocephalic.     Nose: Nose normal.     Mouth/Throat:     Mouth: Mucous membranes are moist.  Eyes:     Pupils: Pupils are equal, round, and reactive to light.  Cardiovascular:     Rate and Rhythm: Normal rate and regular rhythm.     Pulses: Normal pulses.     Heart sounds: Normal heart sounds.  Pulmonary:     Effort: Pulmonary effort is normal.     Breath sounds: Normal breath sounds.  Abdominal:     Palpations: Abdomen is soft.  Musculoskeletal:        General: Swelling present. Normal range of motion.     Cervical back: Normal range of motion.     Right lower leg: Edema present.     Left lower leg: Edema present.  Skin:    General: Skin is warm and dry.     Capillary Refill: Capillary refill takes less than 2 seconds.  Neurological:     General: No focal deficit present.     Mental Status: She is alert.  Psychiatric:        Mood and Affect: Mood normal.    Arrived for home visit for [place] who was ambulating about her home with her back brace on complaining of generalized back pain. Gaspar Skeeters reports her MRI was successfully completed and surgeon wants to complete a back procedure, details were not given by Mrs. Berdine Addison.  Vitals were obtained. Patient's weight was up by 6lbs from last week. Patient reports she has been eating Moms Meals, but noted she has been eating snacks and drinking salty fluids. I explained the importance of staying on the diet she is on. She verbalized understanding. Medications were reviewed and confirmed. Pill box filled accordingly. Legs were swollen with slight edema, which is Mrs. Heindel's normal presentation. Mrs. Mobbs reports she has some compression stockings she is to be using but has not yet. I informed her to be using them as often as she can. She agreed. Home visit complete. I will see Mrs. Tiger in one week.   Weight this week- 200lbs Weight last week- 194lbs      Future Appointments  Date Time Provider [facility]  10/18/2019  7:30 AM [facility] DEVICE REMOTES [facility]  11/08/2019 12:40 PM Bensimhon, Shaune Pascal, MD [facility] None  01/17/2020  7:30 AM [facility] DEVICE REMOTES [facility]  04/17/2020  7:30 AM [facility] DEVICE REMOTES [facility]  06/12/2020 10:00 AM Philemon Kingdom, MD [facility] None     ACTION: Home visit completed Next visit planned for one week

## 2019-10-13 ENCOUNTER — Other Ambulatory Visit (HOSPITAL_COMMUNITY): Payer: Self-pay | Admitting: Psychiatry

## 2019-10-17 ENCOUNTER — Other Ambulatory Visit (HOSPITAL_COMMUNITY): Payer: Self-pay

## 2019-10-17 NOTE — Progress Notes (Signed)
Paramedicine Encounter    Patient ID: Rhonda [name], female    DOB: 05-28-1950, 69 y.o.   MRN: [id number]   Patient Care Team: Maurice Small, MD as PCP - General (Family Medicine) [name], LCSW as Social Worker (Licensed Clinical Social Worker) [name], [name], DO as Consulting Physician (Neurology)  Patient Active Problem List   Diagnosis Date Noted  . Diabetes mellitus ([place]) 06/15/2019  . Hypokalemia 11/08/2018  . CKD (chronic kidney disease) stage 3, GFR 30-59 ml/min 11/08/2018  . Orthostatic hypotension 07/28/2017  . SVD (spontaneous vaginal delivery)   . Peripheral neuropathy   . On home oxygen therapy   . Migraines   . Left bundle branch block   . Hypothyroidism   . Hypertension   . Hyperlipidemia   . Heart murmur   . GERD (gastroesophageal reflux disease)   . Exertional shortness of breath   . Major depressive disorder   . Back pain   . Arthritis   . Generalized anxiety disorder   . Anemia   . Chronic respiratory failure ([place]) 09/14/2013  . 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 Morbid obesity ([place]) 11/01/2012  . Chronic systolic heart failure ([place]) 10/27/2012  . Endometrial polyp 01/20/2012  . Malignant tumor of breast ([place]) 03/26/2011  . Unspecified vitamin D deficiency 03/26/2011    Current Outpatient Medications:  .  acetaZOLAMIDE (DIAMOX) 125 MG tablet, TAKE 1 TABLET(125 MG) BY MOUTH DAILY, Disp: 90 tablet, Rfl: 3 .  carboxymethylcellulose (REFRESH PLUS) 0.5 % SOLN, Place 1 drop into both eyes 3 (three) times daily as needed (dry eyes)., Disp: , Rfl:  .  carvedilol (COREG) 3.125 MG tablet, TAKE 1 TABLET(3.125 MG) BY MOUTH TWICE DAILY WITH A MEAL, Disp: 180 tablet, Rfl: 3 .  citalopram (CELEXA) 20 MG tablet, Take 20 mg by mouth daily., Disp: , Rfl:  .  clonazePAM (KLONOPIN) 0.5 MG tablet, Take 0.5 mg by mouth 3 (three) times daily as needed for anxiety. , Disp: , Rfl:  .  DULoxetine (CYMBALTA) 30 MG capsule, Take  30 mg by mouth daily. Take once in the morning., Disp: , Rfl:  .  fludrocortisone (FLORINEF) 0.1 MG tablet, TAKE 1 TABLET(0.1 MG) BY MOUTH TWICE DAILY, Disp: 60 tablet, Rfl: 3 .  gabapentin (NEURONTIN) 300 MG capsule, Take 300 mg by mouth 3 (three) times daily. , Disp: , Rfl:  .  gabapentin (NEURONTIN) 800 MG tablet, Take 800 mg by mouth at bedtime as needed (neuropathy). , Disp: , Rfl:  .  HYDROcodone-acetaminophen (NORCO) 10-325 MG tablet, Take 1 tablet by mouth every 6 (six) hours as needed., Disp: , Rfl:  .  ipratropium (ATROVENT) 0.03 % nasal spray, Place 2 sprays into both nostrils 2 (two) times daily., Disp: , Rfl:  .  levothyroxine (SYNTHROID) 88 MCG tablet, TAKE 1 TABLET(88 MCG) BY MOUTH DAILY BEFORE BREAKFAST, Disp: 90 tablet, Rfl: 1 .  midodrine (PROAMATINE) 10 MG tablet, Take 1 tablet (10 mg total) by mouth 3 (three) times daily with meals., Disp: 270 tablet, Rfl: 3 .  mirtazapine (REMERON) 30 MG tablet, mirtazapine 30 mg tablet, Disp: , Rfl:  .  montelukast (SINGULAIR) 10 MG tablet, Take 10 mg by mouth at bedtime. , Disp: , Rfl: 1 .  potassium chloride SA (KLOR-CON) 20 MEQ tablet, Take 1 tablet (20 mEq total) by mouth 2 (two) times daily., Disp: 180 tablet, Rfl: 3 .  probenecid (BENEMID) 500 MG tablet, Take 500 mg by mouth 2 (two) times daily., Disp: ,  Rfl:  .  simvastatin (ZOCOR) 10 MG tablet, Take 10 mg by mouth every evening. , Disp: , Rfl:  .  spironolactone (ALDACTONE) 25 MG tablet, Take 0.5 tablets (12.5 mg total) by mouth every evening., Disp: 45 tablet, Rfl: 3 .  torsemide (DEMADEX) 20 MG tablet, Take 3 tablets (60 mg total) by mouth 2 (two) times daily., Disp: 540 tablet, Rfl: 3 .  buPROPion (WELLBUTRIN XL) 150 MG 24 hr tablet, Take 450 mg by mouth every morning. , Disp: , Rfl:  .  febuxostat (ULORIC) 40 MG tablet, 40 mg daily., Disp: , Rfl:  .  fexofenadine (ALLEGRA) 180 MG tablet, Take 180 mg by mouth 2 (two) times daily., Disp: , Rfl:  .  metolazone (ZAROXOLYN) 2.5 MG  tablet, TAKE ONLY AS DIRECTED BY CHF CLINIC (Patient not taking: Reported on 10/17/2019), Disp: 10 tablet, Rfl: 3 .  Nabumetone (RELAFEN PO), nabumetone, Disp: , Rfl:  .  ondansetron (ZOFRAN) 8 MG tablet, , Disp: , Rfl:  .  zolpidem (AMBIEN) 10 MG tablet, Take 10 mg by mouth at bedtime as needed for sleep. For sleep , Disp: , Rfl:  Allergies  Allergen Reactions  . Ceftin Anaphylaxis    Face and throat swell   . Geodon [Ziprasidone Hcl] Hives  . Lisinopril Other (See Comments)    angioedema  . Shellfish Allergy Other (See Comments)    Gout exacerbation  . Allopurinol Nausea Only and Other (See Comments)    weakness  . Ativan [Lorazepam] Itching  . Sulfa Antibiotics Itching  . Ultram [Tramadol Hcl] Itching  . Valium [Diazepam] Other (See Comments)    Patient states that diazepam doesn't relax, it has the opposite effect.     Social History   Socioeconomic History  . Marital status: Married    Spouse name: Not on file  . Number of children: 2  . Years of education: 29  . Highest education level: Master's degree (e.g., MA, MS, MEng, MEd, MSW, MBA)  Occupational History  . Occupation: retired  Tobacco Use  . Smoking status: Former Smoker    Packs/day: 0.10    Years: 26.00    Pack years: 2.60    Types: Cigarettes    Quit date: 05/06/2007    Years since quitting: 12.4  . Smokeless tobacco: Never Used  Substance and Sexual Activity  . Alcohol use: No  . Drug use: No  . Sexual activity: Yes  Other Topics Concern  . 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 Difficulty of Paying Living Expenses:   Food  Insecurity:   . Worried About Charity fundraiser in the Last Year:   . Arboriculturist in the Last Year:   Transportation Needs:   . Film/video editor (Medical):   Marland Kitchen Lack of Transportation (Non-Medical):   Physical Activity:   . Days of Exercise per Week:   . Minutes of Exercise per Session:   Stress:   . Feeling of Stress :   Social Connections:   .  Frequency of Communication with Friends and Family:   . Frequency of Social Gatherings with Friends and Family:   . Attends Religious Services:   . Active Member of Clubs or Organizations:   . Attends Archivist Meetings:   Marland Kitchen Marital Status:   Intimate Partner Violence:   . Fear of Current or Ex-Partner:   . Emotionally Abused:   Marland Kitchen Physically Abused:   . Sexually Abused:     Physical Exam Vitals reviewed.  Constitutional:      Appearance: She is normal weight.  HENT:     Nose: Nose normal.     Mouth/Throat:     Mouth: Mucous membranes are moist.     Pharynx: Oropharynx is clear.  Eyes:     Pupils: Pupils are equal, round, and reactive to light.  Cardiovascular:     Rate and Rhythm: Normal rate and regular rhythm.     Pulses: Normal pulses.     Heart sounds: Normal heart sounds.  Pulmonary:     Effort: Pulmonary effort is normal.     Breath sounds: Normal breath sounds.  Abdominal:     Palpations: Abdomen is soft.  Musculoskeletal:        General: Normal range of motion.     Cervical back: Normal range of motion.     Right lower leg: Edema present.     Left lower leg: Edema present.  Skin:    General: Skin is warm and dry.     Capillary Refill: Capillary refill takes less than 2 seconds.  Neurological:     Mental Status: She is alert. Mental status is at baseline.  Psychiatric:        Mood and Affect: Mood normal.     Arrived for home visit for Wendelin who was seated in the living room of her home placing her compression stockings on. I assisted with same. Mrs. Steingraber reports feeling okay just sad  and depressed today. Mrs. Mccluney reports not taking her medications yet today. Vitals were obtained and are as noted. Medications were reviewed and verified and pill box was filled accordingly. Melva was reminded of upcoming appointments and dietary education. Eliannah was appreciative. Home visit complete. I will see her in one week.     Future Appointments  Date Time Provider [place]  10/18/2019  7:30 AM [facility] DEVICE REMOTES [facility]  11/08/2019 12:40 PM Bensimhon, Shaune Pascal, MD [facility] None  01/17/2020  7:30 AM [facility] DEVICE REMOTES [facility]  04/17/2020  7:30 AM [facility] DEVICE REMOTES [facility]  06/12/2020 10:00 AM Philemon Kingdom, MD [facility] None     ACTION: Home visit completed Next visit planned for one week

## 2019-10-18 ENCOUNTER — Ambulatory Visit (INDEPENDENT_AMBULATORY_CARE_PROVIDER_SITE_OTHER): Payer: Medicare HMO | Admitting: *Deleted

## 2019-10-18 DIAGNOSIS — I428 Other cardiomyopathies: Secondary | ICD-10-CM

## 2019-10-18 LAB — CUP PACEART REMOTE DEVICE CHECK
Battery Remaining Longevity: 6 mo
Battery Voltage: 2.82 V
Brady Statistic AP VP Percent: 0.05 %
Brady Statistic AP VS Percent: 0.01 %
Brady Statistic AS VP Percent: 99.92 %
Brady Statistic AS VS Percent: 0.02 %
Brady Statistic RA Percent Paced: 0.06 %
Brady Statistic RV Percent Paced: 99.95 %
Date Time Interrogation Session: 20210526132308
HighPow Impedance: 42 Ohm
Implantable Lead Implant Date: 20141210
Implantable Lead Implant Date: 20141210
Implantable Lead Implant Date: 20141210
Implantable Lead Location: 753858
Implantable Lead Location: 753859
Implantable Lead Location: 753860
Implantable Lead Model: 4396
Implantable Lead Model: 5076
Implantable Lead Model: 6935
Implantable Pulse Generator Implant Date: 20141210
Lead Channel Impedance Value: 304 Ohm
Lead Channel Impedance Value: 304 Ohm
Lead Channel Impedance Value: 437 Ohm
Lead Channel Impedance Value: 494 Ohm
Lead Channel Impedance Value: 513 Ohm
Lead Channel Impedance Value: 532 Ohm
Lead Channel Pacing Threshold Amplitude: 0.625 V
Lead Channel Pacing Threshold Amplitude: 0.875 V
Lead Channel Pacing Threshold Amplitude: 1 V
Lead Channel Pacing Threshold Pulse Width: 0.4 ms
Lead Channel Pacing Threshold Pulse Width: 0.4 ms
Lead Channel Pacing Threshold Pulse Width: 0.4 ms
Lead Channel Sensing Intrinsic Amplitude: 2 mV
Lead Channel Sensing Intrinsic Amplitude: 2 mV
Lead Channel Sensing Intrinsic Amplitude: 20.5 mV
Lead Channel Sensing Intrinsic Amplitude: 20.5 mV
Lead Channel Setting Pacing Amplitude: 2 V
Lead Channel Setting Pacing Amplitude: 2 V
Lead Channel Setting Pacing Amplitude: 2.5 V
Lead Channel Setting Pacing Pulse Width: 0.4 ms
Lead Channel Setting Pacing Pulse Width: 0.4 ms
Lead Channel Setting Sensing Sensitivity: 0.3 mV

## 2019-10-19 NOTE — Progress Notes (Signed)
Remote ICD transmission.   

## 2019-10-20 ENCOUNTER — Ambulatory Visit (INDEPENDENT_AMBULATORY_CARE_PROVIDER_SITE_OTHER): Payer: Medicare HMO

## 2019-10-20 DIAGNOSIS — I5022 Chronic systolic (congestive) heart failure: Secondary | ICD-10-CM | POA: Diagnosis not present

## 2019-10-20 DIAGNOSIS — Z95810 Presence of automatic (implantable) cardiac defibrillator: Secondary | ICD-10-CM

## 2019-10-20 NOTE — Progress Notes (Signed)
EPIC Encounter for ICM Monitoring  Patient Name: Rhonda [name] is a 69 y.o. female Date: 10/20/2019 Primary Care Physican: Maurice Small, MD Primary Cardiologist:Bensimhon Electrophysiologist: Santina Evans Pacing:100% 4/26/2021Weight: 180lbs  Battery ERI approximately 5 months   Transmission reviewed.    Optivol thoracic impedanceclose to baseline normal.No change impedance pattern  Prescribed: Patient participating in paramedicine program to assist with meds.  Torsemide 20 mg3tablets (60 mg total)by mouthtwice a day.  Potassium 20 meq1tablet  (20 mEq total)twice a day.   Metolazone 2.5mg  1 tablet by mouthas directed by CHF clinic  Labs: 06/07/2019 Creatinine 1.57, BUN 22, Potassium 3.6, Sodium 142, GFR 34-39 03/30/2019 Creatinine 1.68, BUN 24, Potassium 3.6, Sodium 139, GFR 31-36 01/11/2019 Creatinine 1.84, BUN 35, Potassium 4.2, Sodium 140, GFR 28-33 A complete set of results can be found in Results Review.  Recommendations:None  Follow-up plan: ICM clinic phone appointment on6/28/2021. 91 day device clinic remote transmission8/25/2021. OV with Dr Haroldine Laws 11/08/2019.  Copy of ICM check sent to Dr.Taylor.  3 month ICM trend: 10/18/2019    1 Year ICM trend:       [name], RN 10/20/2019 4:57 PM

## 2019-10-24 ENCOUNTER — Telehealth (HOSPITAL_COMMUNITY): Payer: Self-pay | Admitting: *Deleted

## 2019-10-24 ENCOUNTER — Telehealth (HOSPITAL_COMMUNITY): Payer: Self-pay | Admitting: Vascular Surgery

## 2019-10-24 ENCOUNTER — Other Ambulatory Visit (HOSPITAL_COMMUNITY): Payer: Self-pay

## 2019-10-24 NOTE — Telephone Encounter (Signed)
[name] w/paramedicine called to report pts 6lb weight gain and BLEE. Per Amy take metolazone today with 24meq of K and keep office visit on 6/16. [name] aware.

## 2019-10-24 NOTE — Telephone Encounter (Signed)
Left pt message that 6/16 appt @ 12:40PM, will be change to 9:40 AM due to changes in doctors schedule, asked pt to call back to confirm appt change

## 2019-10-24 NOTE — Progress Notes (Signed)
Paramedicine Encounter    Patient ID: Rhonda [name], female    DOB: 1950/07/30, 69 y.o.   MRN: [id number]   Patient Care Team: Maurice Small, MD as PCP - General (Family Medicine) [name], LCSW as Social Worker (Licensed Clinical Social Worker) [name], [name], DO as Consulting Physician (Neurology)  Patient Active Problem List   Diagnosis Date Noted  . Diabetes mellitus ([place]) 06/15/2019  . Hypokalemia 11/08/2018  . CKD (chronic kidney disease) stage 3, GFR 30-59 ml/min 11/08/2018  . Orthostatic hypotension 07/28/2017  . SVD (spontaneous vaginal delivery)   . Peripheral neuropathy   . On home oxygen therapy   . Migraines   . Left bundle branch block   . Hypothyroidism   . Hypertension   . Hyperlipidemia   . Heart murmur   . GERD (gastroesophageal reflux disease)   . Exertional shortness of breath   . Major depressive disorder   . Back pain   . Arthritis   . Generalized anxiety disorder   . Anemia   . Chronic respiratory failure ([place]) 09/14/2013  . 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 Morbid obesity ([place]) 11/01/2012  . Chronic systolic heart failure ([place]) 10/27/2012  . Endometrial polyp 01/20/2012  . Malignant tumor of breast ([place]) 03/26/2011  . Unspecified vitamin D deficiency 03/26/2011    Current Outpatient Medications:  .  acetaZOLAMIDE (DIAMOX) 125 MG tablet, TAKE 1 TABLET(125 MG) BY MOUTH DAILY, Disp: 90 tablet, Rfl: 3 .  carboxymethylcellulose (REFRESH PLUS) 0.5 % SOLN, Place 1 drop into both eyes 3 (three) times daily as needed (dry eyes)., Disp: , Rfl:  .  carvedilol (COREG) 3.125 MG tablet, TAKE 1 TABLET(3.125 MG) BY MOUTH TWICE DAILY WITH A MEAL, Disp: 180 tablet, Rfl: 3 .  citalopram (CELEXA) 20 MG tablet, Take 20 mg by mouth daily., Disp: , Rfl:  .  clonazePAM (KLONOPIN) 0.5 MG tablet, Take 0.5 mg by mouth 3 (three) times daily as needed for anxiety. , Disp: , Rfl:  .  DULoxetine (CYMBALTA) 30 MG capsule, Take  30 mg by mouth daily. Take once in the morning., Disp: , Rfl:  .  fludrocortisone (FLORINEF) 0.1 MG tablet, TAKE 1 TABLET(0.1 MG) BY MOUTH TWICE DAILY, Disp: 60 tablet, Rfl: 3 .  gabapentin (NEURONTIN) 300 MG capsule, Take 300 mg by mouth 3 (three) times daily. , Disp: , Rfl:  .  gabapentin (NEURONTIN) 800 MG tablet, Take 800 mg by mouth at bedtime as needed (neuropathy). , Disp: , Rfl:  .  ipratropium (ATROVENT) 0.03 % nasal spray, Place 2 sprays into both nostrils 2 (two) times daily., Disp: , Rfl:  .  levothyroxine (SYNTHROID) 88 MCG tablet, TAKE 1 TABLET(88 MCG) BY MOUTH DAILY BEFORE BREAKFAST, Disp: 90 tablet, Rfl: 1 .  midodrine (PROAMATINE) 10 MG tablet, Take 1 tablet (10 mg total) by mouth 3 (three) times daily with meals., Disp: 270 tablet, Rfl: 3 .  mirtazapine (REMERON) 30 MG tablet, TAKE 1 TABLET BY MOUTH EVERY NIGHT AT BEDTIME, Disp: 30 tablet, Rfl: 3 .  montelukast (SINGULAIR) 10 MG tablet, Take 10 mg by mouth at bedtime. , Disp: , Rfl: 1 .  potassium chloride SA (KLOR-CON) 20 MEQ tablet, Take 1 tablet (20 mEq total) by mouth 2 (two) times daily., Disp: 180 tablet, Rfl: 3 .  probenecid (BENEMID) 500 MG tablet, Take 500 mg by mouth 2 (two) times daily., Disp: , Rfl:  .  simvastatin (ZOCOR) 10 MG tablet, Take 10 mg by mouth every evening. ,  Disp: , Rfl:  .  spironolactone (ALDACTONE) 25 MG tablet, Take 0.5 tablets (12.5 mg total) by mouth every evening., Disp: 45 tablet, Rfl: 3 .  torsemide (DEMADEX) 20 MG tablet, Take 3 tablets (60 mg total) by mouth 2 (two) times daily., Disp: 540 tablet, Rfl: 3 .  buPROPion (WELLBUTRIN XL) 150 MG 24 hr tablet, Take 450 mg by mouth every morning. , Disp: , Rfl:  .  febuxostat (ULORIC) 40 MG tablet, 40 mg daily., Disp: , Rfl:  .  fexofenadine (ALLEGRA) 180 MG tablet, Take 180 mg by mouth 2 (two) times daily., Disp: , Rfl:  .  HYDROcodone-acetaminophen (NORCO) 10-325 MG tablet, Take 1 tablet by mouth every 6 (six) hours as needed., Disp: , Rfl:  .   metolazone (ZAROXOLYN) 2.5 MG tablet, TAKE ONLY AS DIRECTED BY CHF CLINIC (Patient not taking: Reported on 10/17/2019), Disp: 10 tablet, Rfl: 3 .  Nabumetone (RELAFEN PO), nabumetone, Disp: , Rfl:  .  ondansetron (ZOFRAN) 8 MG tablet, , Disp: , Rfl:  .  zolpidem (AMBIEN) 10 MG tablet, Take 10 mg by mouth at bedtime as needed for sleep. For sleep , Disp: , Rfl:  Allergies  Allergen Reactions  . Ceftin Anaphylaxis    Face and throat swell   . Geodon [Ziprasidone Hcl] Hives  . Lisinopril Other (See Comments)    angioedema  . Shellfish Allergy Other (See Comments)    Gout exacerbation  . Allopurinol Nausea Only and Other (See Comments)    weakness  . Ativan [Lorazepam] Itching  . Sulfa Antibiotics Itching  . Ultram [Tramadol Hcl] Itching  . Valium [Diazepam] Other (See Comments)    Patient states that diazepam doesn't relax, it has the opposite effect.     Social History   Socioeconomic History  . Marital status: Married    Spouse name: Not on file  . Number of children: 2  . Years of education: 72  . Highest education level: Master's degree (e.g., MA, MS, MEng, MEd, MSW, MBA)  Occupational History  . Occupation: retired  Tobacco Use  . Smoking status: Former Smoker    Packs/day: 0.10    Years: 26.00    Pack years: 2.60    Types: Cigarettes    Quit date: 05/06/2007    Years since quitting: 12.4  . Smokeless tobacco: Never Used  Substance and Sexual Activity  . Alcohol use: No  . Drug use: No  . Sexual activity: Yes  Other Topics Concern  . 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 Difficulty of  Paying Living Expenses:   Food Insecurity:   . Worried About Charity fundraiser in the Last Year:   . Arboriculturist in the Last Year:   Transportation Needs:   . Film/video editor (Medical):   Marland Kitchen Lack of Transportation (Non-Medical):   Physical Activity:   . Days of Exercise per Week:   . Minutes of Exercise per Session:   Stress:   . Feeling of Stress :  Social Connections:   . Frequency of Communication with Friends and Family:   . Frequency of Social Gatherings with Friends and Family:   . Attends Religious Services:   . Active Member of Clubs or Organizations:   . Attends Archivist Meetings:   Marland Kitchen Marital Status:   Intimate Partner Violence:   . Fear of Current or Ex-Partner:   . Emotionally Abused:   Marland Kitchen Physically Abused:   . Sexually Abused:     Physical Exam HENT:     Head: Normocephalic.     Nose: Nose normal.     Mouth/Throat:     Mouth: Mucous membranes are moist.     Pharynx: Oropharynx is clear.  Eyes:     Pupils: Pupils are equal, round, and reactive to light.  Cardiovascular:     Rate and Rhythm: Normal rate and regular rhythm.     Pulses: Normal pulses.     Heart sounds: Normal heart sounds.  Pulmonary:     Effort: Pulmonary effort is normal.     Breath sounds: Normal breath sounds.  Abdominal:     General: Abdomen is flat.     Palpations: Abdomen is soft.  Musculoskeletal:        General: Swelling present. Normal range of motion.     Cervical back: Normal range of motion.     Right lower leg: Edema present.     Left lower leg: Edema present.  Skin:    General: Skin is warm and dry.     Capillary Refill: Capillary refill takes less than 2 seconds.  Neurological:     Mental Status: She is alert. Mental status is at baseline.  Psychiatric:        Mood and Affect: Mood normal.    Arrived for home visit for Rhonda [name] today at home where she was seated in the kitchen alert and oriented reporting she has had some increased fatigue and  some slight shortness of breath with a weight gain since last week. Rhonda [name] was up 6 pounds on assessment with bilateral leg swelling with pitting edema. Vitals as noted in report. Heart Failure Clinic was contacted and they advised patient to take a metolazone as well as an extra potassium. I gave patient same during visit. Medications were reviewed and confirmed. Pill box filled accordingly. Rhonda [name] was reminded of upcoming appointments. Home visit complete. I will see patient in one week, she is advised to call if worsening of her symptoms occur. She verbalized agreement.         Future Appointments  Date Time Provider [place]  11/08/2019  9:20 AM Bensimhon, Shaune Pascal, MD [facility] None  11/17/2019  7:20 AM [facility] DEVICE REMOTES [facility]  11/20/2019  8:40 AM [facility] DEVICE REMOTES [facility]  12/18/2019 11:35 AM [facility] DEVICE REMOTES [facility]  01/17/2020  7:30 AM [facility] DEVICE REMOTES [facility]  04/17/2020  7:30 AM [facility] DEVICE REMOTES [facility]  06/12/2020 10:00 AM Philemon Kingdom, MD [facility] None  06/17/2020  7:30 AM [facility] DEVICE REMOTES [facility]  07/17/2020  7:30 AM [facility] DEVICE REMOTES [facility]     ACTION: Home visit completed Next visit planned for one week

## 2019-10-25 ENCOUNTER — Telehealth (HOSPITAL_COMMUNITY): Payer: Self-pay | Admitting: Licensed Clinical Social Worker

## 2019-10-25 NOTE — Telephone Encounter (Signed)
CSW informed by [employer] that pt is inquiring about getting another shipment from [employer] as she has been calling and told that she hasn't been approved for anymore.  CSW checked with [facility] representative but pt has already received multiple shipments so cannot be approved for additional meals.  CSW called pt and discussed.  Pt confirms she gets help with grocery shopping from her daughters but the meals were really helpful because she cannot safely cook anymore.  CSW discussed alternatives like lower sodium frozen meals- encouraged her to have her daughters find some options she likes so she doesn't have to cook when she is feeling unwell.  CSW also updated Clinical biochemist who will follow up with patient regarding this as well.  CSW will continue to follow and assist as needed  [name], [facility] Desk#: [phone] Cell#: [phone]

## 2019-10-27 DIAGNOSIS — Z9181 History of falling: Secondary | ICD-10-CM | POA: Diagnosis not present

## 2019-10-27 DIAGNOSIS — Z7951 Long term (current) use of inhaled steroids: Secondary | ICD-10-CM | POA: Diagnosis not present

## 2019-10-27 DIAGNOSIS — M4856XD Collapsed vertebra, not elsewhere classified, lumbar region, subsequent encounter for fracture with routine healing: Secondary | ICD-10-CM | POA: Diagnosis not present

## 2019-10-27 DIAGNOSIS — M4316 Spondylolisthesis, lumbar region: Secondary | ICD-10-CM | POA: Diagnosis not present

## 2019-10-27 DIAGNOSIS — I2510 Atherosclerotic heart disease of native coronary artery without angina pectoris: Secondary | ICD-10-CM | POA: Diagnosis not present

## 2019-10-27 DIAGNOSIS — Z79891 Long term (current) use of opiate analgesic: Secondary | ICD-10-CM | POA: Diagnosis not present

## 2019-10-27 DIAGNOSIS — M48061 Spinal stenosis, lumbar region without neurogenic claudication: Secondary | ICD-10-CM | POA: Diagnosis not present

## 2019-10-31 ENCOUNTER — Other Ambulatory Visit (HOSPITAL_COMMUNITY): Payer: Self-pay

## 2019-10-31 NOTE — Progress Notes (Signed)
Paramedicine Encounter    Patient ID: Rhonda [name], female    DOB: 1950/07/30, 69 y.o.   MRN: [id number]   Patient Care Team: Maurice Small, MD as PCP - General (Family Medicine) [name], LCSW as Social Worker (Licensed Clinical Social Worker) [name], [name], DO as Consulting Physician (Neurology)  Patient Active Problem List   Diagnosis Date Noted  . Diabetes mellitus ([place]) 06/15/2019  . Hypokalemia 11/08/2018  . CKD (chronic kidney disease) stage 3, GFR 30-59 ml/min 11/08/2018  . Orthostatic hypotension 07/28/2017  . SVD (spontaneous vaginal delivery)   . Peripheral neuropathy   . On home oxygen therapy   . Migraines   . Left bundle branch block   . Hypothyroidism   . Hypertension   . Hyperlipidemia   . Heart murmur   . GERD (gastroesophageal reflux disease)   . Exertional shortness of breath   . Major depressive disorder   . Back pain   . Arthritis   . Generalized anxiety disorder   . Anemia   . Chronic respiratory failure ([place]) 09/14/2013  . 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 Morbid obesity ([place]) 11/01/2012  . Chronic systolic heart failure ([place]) 10/27/2012  . Endometrial polyp 01/20/2012  . Malignant tumor of breast ([place]) 03/26/2011  . Unspecified vitamin D deficiency 03/26/2011    Current Outpatient Medications:  .  acetaZOLAMIDE (DIAMOX) 125 MG tablet, TAKE 1 TABLET(125 MG) BY MOUTH DAILY, Disp: 90 tablet, Rfl: 3 .  carboxymethylcellulose (REFRESH PLUS) 0.5 % SOLN, Place 1 drop into both eyes 3 (three) times daily as needed (dry eyes)., Disp: , Rfl:  .  carvedilol (COREG) 3.125 MG tablet, TAKE 1 TABLET(3.125 MG) BY MOUTH TWICE DAILY WITH A MEAL, Disp: 180 tablet, Rfl: 3 .  citalopram (CELEXA) 20 MG tablet, Take 20 mg by mouth daily., Disp: , Rfl:  .  clonazePAM (KLONOPIN) 0.5 MG tablet, Take 0.5 mg by mouth 3 (three) times daily as needed for anxiety. , Disp: , Rfl:  .  DULoxetine (CYMBALTA) 30 MG capsule, Take  30 mg by mouth daily. Take once in the morning., Disp: , Rfl:  .  fludrocortisone (FLORINEF) 0.1 MG tablet, TAKE 1 TABLET(0.1 MG) BY MOUTH TWICE DAILY, Disp: 60 tablet, Rfl: 3 .  gabapentin (NEURONTIN) 300 MG capsule, Take 300 mg by mouth 3 (three) times daily. , Disp: , Rfl:  .  gabapentin (NEURONTIN) 800 MG tablet, Take 800 mg by mouth at bedtime as needed (neuropathy). , Disp: , Rfl:  .  ipratropium (ATROVENT) 0.03 % nasal spray, Place 2 sprays into both nostrils 2 (two) times daily., Disp: , Rfl:  .  levothyroxine (SYNTHROID) 88 MCG tablet, TAKE 1 TABLET(88 MCG) BY MOUTH DAILY BEFORE BREAKFAST, Disp: 90 tablet, Rfl: 1 .  midodrine (PROAMATINE) 10 MG tablet, Take 1 tablet (10 mg total) by mouth 3 (three) times daily with meals., Disp: 270 tablet, Rfl: 3 .  mirtazapine (REMERON) 30 MG tablet, TAKE 1 TABLET BY MOUTH EVERY NIGHT AT BEDTIME, Disp: 30 tablet, Rfl: 3 .  montelukast (SINGULAIR) 10 MG tablet, Take 10 mg by mouth at bedtime. , Disp: , Rfl: 1 .  potassium chloride SA (KLOR-CON) 20 MEQ tablet, Take 1 tablet (20 mEq total) by mouth 2 (two) times daily., Disp: 180 tablet, Rfl: 3 .  probenecid (BENEMID) 500 MG tablet, Take 500 mg by mouth 2 (two) times daily., Disp: , Rfl:  .  simvastatin (ZOCOR) 10 MG tablet, Take 10 mg by mouth every evening. ,  Disp: , Rfl:  .  spironolactone (ALDACTONE) 25 MG tablet, Take 0.5 tablets (12.5 mg total) by mouth every evening., Disp: 45 tablet, Rfl: 3 .  torsemide (DEMADEX) 20 MG tablet, Take 3 tablets (60 mg total) by mouth 2 (two) times daily., Disp: 540 tablet, Rfl: 3 .  buPROPion (WELLBUTRIN XL) 150 MG 24 hr tablet, Take 450 mg by mouth every morning. , Disp: , Rfl:  .  febuxostat (ULORIC) 40 MG tablet, 40 mg daily., Disp: , Rfl:  .  fexofenadine (ALLEGRA) 180 MG tablet, Take 180 mg by mouth 2 (two) times daily., Disp: , Rfl:  .  HYDROcodone-acetaminophen (NORCO) 10-325 MG tablet, Take 1 tablet by mouth every 6 (six) hours as needed., Disp: , Rfl:  .   metolazone (ZAROXOLYN) 2.5 MG tablet, TAKE ONLY AS DIRECTED BY CHF CLINIC (Patient not taking: Reported on 10/17/2019), Disp: 10 tablet, Rfl: 3 .  Nabumetone (RELAFEN PO), nabumetone, Disp: , Rfl:  .  ondansetron (ZOFRAN) 8 MG tablet, , Disp: , Rfl:  .  zolpidem (AMBIEN) 10 MG tablet, Take 10 mg by mouth at bedtime as needed for sleep. For sleep , Disp: , Rfl:  Allergies  Allergen Reactions  . Ceftin Anaphylaxis    Face and throat swell   . Geodon [Ziprasidone Hcl] Hives  . Lisinopril Other (See Comments)    angioedema  . Shellfish Allergy Other (See Comments)    Gout exacerbation  . Allopurinol Nausea Only and Other (See Comments)    weakness  . Ativan [Lorazepam] Itching  . Sulfa Antibiotics Itching  . Ultram [Tramadol Hcl] Itching  . Valium [Diazepam] Other (See Comments)    Patient states that diazepam doesn't relax, it has the opposite effect.     Social History   Socioeconomic History  . Marital status: Married    Spouse name: Not on file  . Number of children: 2  . Years of education: 72  . Highest education level: Master's degree (e.g., MA, MS, MEng, MEd, MSW, MBA)  Occupational History  . Occupation: retired  Tobacco Use  . Smoking status: Former Smoker    Packs/day: 0.10    Years: 26.00    Pack years: 2.60    Types: Cigarettes    Quit date: 05/06/2007    Years since quitting: 12.4  . Smokeless tobacco: Never Used  Substance and Sexual Activity  . Alcohol use: No  . Drug use: No  . Sexual activity: Yes  Other Topics Concern  . 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 Difficulty of  Paying Living Expenses:   Food Insecurity:   . Worried About Charity fundraiser in the Last Year:   . Arboriculturist in the Last Year:   Transportation Needs:   . Film/video editor (Medical):   Marland Kitchen Lack of Transportation (Non-Medical):   Physical Activity:   . Days of Exercise per Week:   . Minutes of Exercise per Session:   Stress:   . Feeling of Stress :  Social Connections:   . Frequency of Communication with Friends and Family:   . Frequency of Social Gatherings with Friends and Family:   . Attends Religious Services:   . Active Member of Clubs or Organizations:   . Attends Archivist Meetings:   Marland Kitchen Marital Status:   Intimate Partner Violence:   . Fear of Current or Ex-Partner:   . Emotionally Abused:   Marland Kitchen Physically Abused:   . Sexually Abused:     Physical Exam Vitals reviewed.  Constitutional:      Appearance: She is normal weight.  HENT:     Head: Normocephalic.     Nose: Nose normal.     Mouth/Throat:     Mouth: Mucous membranes are moist.  Eyes:     Pupils: Pupils are equal, round, and reactive to light.  Cardiovascular:     Rate and Rhythm: Normal rate and regular rhythm.     Pulses: Normal pulses.     Heart sounds: Normal heart sounds.  Pulmonary:     Effort: Pulmonary effort is normal.     Breath sounds: Normal breath sounds.  Abdominal:     Palpations: Abdomen is soft.  Musculoskeletal:        General: Swelling present. Normal range of motion.     Cervical back: Normal range of motion and neck supple.     Right lower leg: Edema present.     Left lower leg: Edema present.  Skin:    General: Skin is warm and dry.     Capillary Refill: Capillary refill takes less than 2 seconds.  Neurological:     Mental Status: She is alert. Mental status is at baseline.  Psychiatric:        Mood and Affect: Mood normal.     Arrived for home visit for Sylvester who was ambulating around her home without difficulty. Lorette denies chest pain,  shortness of breath, dizziness. Monicka had mild swelling to her lower legs which is normal for Mrs. Ng. Vitals were obtained. Deborra had clear lung sounds. Her weight is down this week from last week after taking a metolazone as directed by HF clinic. Medications were verified and confirmed. Pill box filled accordingly. Lakishia was reminded of her appointments and agreed with same. I will see patient in one week. Home visit complete.   Refills: NONE     Future Appointments  Date Time Provider [place]  11/08/2019  9:20 AM Bensimhon, Shaune Pascal, MD [facility] None  11/17/2019  7:20 AM [facility] DEVICE REMOTES [facility]  11/20/2019  8:40 AM [facility] DEVICE REMOTES [facility]  12/18/2019 11:35 AM [facility]ICE REMOTES [facility]  01/17/2020  7:30 AM [facility]ICE REMOTES [facility]  04/17/2020  7:30 AM [facility]ICE REMOTES [facility]  06/12/2020 10:00 AM Philemon Kingdom, MD [facility] None  06/17/2020  7:30 AM [facility] DEVICE REMOTES [facility]  07/17/2020  7:30 AM [facility] DEVICE REMOTES [facility]     ACTION: Home visit completed Next visit planned for one week

## 2019-11-01 ENCOUNTER — Telehealth (HOSPITAL_COMMUNITY): Payer: Self-pay | Admitting: *Deleted

## 2019-11-01 DIAGNOSIS — I2510 Atherosclerotic heart disease of native coronary artery without angina pectoris: Secondary | ICD-10-CM | POA: Diagnosis not present

## 2019-11-01 DIAGNOSIS — Z7951 Long term (current) use of inhaled steroids: Secondary | ICD-10-CM | POA: Diagnosis not present

## 2019-11-01 DIAGNOSIS — M4316 Spondylolisthesis, lumbar region: Secondary | ICD-10-CM | POA: Diagnosis not present

## 2019-11-01 DIAGNOSIS — Z79891 Long term (current) use of opiate analgesic: Secondary | ICD-10-CM | POA: Diagnosis not present

## 2019-11-01 DIAGNOSIS — M48061 Spinal stenosis, lumbar region without neurogenic claudication: Secondary | ICD-10-CM | POA: Diagnosis not present

## 2019-11-01 DIAGNOSIS — Z9181 History of falling: Secondary | ICD-10-CM | POA: Diagnosis not present

## 2019-11-01 DIAGNOSIS — M4856XD Collapsed vertebra, not elsewhere classified, lumbar region, subsequent encounter for fracture with routine healing: Secondary | ICD-10-CM | POA: Diagnosis not present

## 2019-11-01 NOTE — Telephone Encounter (Signed)
Pt is currently being followed by our [name] and [facility]. Per Dr Haroldine Laws pt does not need both services and will continue with Paramedicine. Order to d/c remote health faxed to them at [phone]

## 2019-11-07 ENCOUNTER — Other Ambulatory Visit (HOSPITAL_COMMUNITY): Payer: Self-pay

## 2019-11-07 DIAGNOSIS — Z9181 History of falling: Secondary | ICD-10-CM | POA: Diagnosis not present

## 2019-11-07 DIAGNOSIS — Z79891 Long term (current) use of opiate analgesic: Secondary | ICD-10-CM | POA: Diagnosis not present

## 2019-11-07 DIAGNOSIS — M4856XD Collapsed vertebra, not elsewhere classified, lumbar region, subsequent encounter for fracture with routine healing: Secondary | ICD-10-CM | POA: Diagnosis not present

## 2019-11-07 DIAGNOSIS — I2510 Atherosclerotic heart disease of native coronary artery without angina pectoris: Secondary | ICD-10-CM | POA: Diagnosis not present

## 2019-11-07 DIAGNOSIS — Z7951 Long term (current) use of inhaled steroids: Secondary | ICD-10-CM | POA: Diagnosis not present

## 2019-11-07 DIAGNOSIS — M4316 Spondylolisthesis, lumbar region: Secondary | ICD-10-CM | POA: Diagnosis not present

## 2019-11-07 DIAGNOSIS — M48061 Spinal stenosis, lumbar region without neurogenic claudication: Secondary | ICD-10-CM | POA: Diagnosis not present

## 2019-11-07 NOTE — Progress Notes (Signed)
Paramedicine Encounter    Patient ID: Rhonda [name], female    DOB: 28-Jul-1950, 69 y.o.   MRN: [id number]   Patient Care Team: Maurice Small, MD as PCP - General (Family Medicine) [name], LCSW as Social Worker (Licensed Clinical Social Worker) [name], [name], DO as Consulting Physician (Neurology)  Patient Active Problem List   Diagnosis Date Noted  . Diabetes mellitus ([place]) 06/15/2019  . Hypokalemia 11/08/2018  . CKD (chronic kidney disease) stage 3, GFR 30-59 ml/min 11/08/2018  . Orthostatic hypotension 07/28/2017  . SVD (spontaneous vaginal delivery)   . Peripheral neuropathy   . On home oxygen therapy   . Migraines   . Left bundle branch block   . Hypothyroidism   . Hypertension   . Hyperlipidemia   . Heart murmur   . GERD (gastroesophageal reflux disease)   . Exertional shortness of breath   . Major depressive disorder   . Back pain   . Arthritis   . Generalized anxiety disorder   . Anemia   . Chronic respiratory failure ([place]) 09/14/2013  . 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 Morbid obesity ([place]) 11/01/2012  . Chronic systolic heart failure ([place]) 10/27/2012  . Endometrial polyp 01/20/2012  . Malignant tumor of breast ([place]) 03/26/2011  . Unspecified vitamin D deficiency 03/26/2011    Current Outpatient Medications:  .  acetaZOLAMIDE (DIAMOX) 125 MG tablet, TAKE 1 TABLET(125 MG) BY MOUTH DAILY, Disp: 90 tablet, Rfl: 3 .  carboxymethylcellulose (REFRESH PLUS) 0.5 % SOLN, Place 1 drop into both eyes 3 (three) times daily as needed (dry eyes)., Disp: , Rfl:  .  carvedilol (COREG) 3.125 MG tablet, TAKE 1 TABLET(3.125 MG) BY MOUTH TWICE DAILY WITH A MEAL, Disp: 180 tablet, Rfl: 3 .  citalopram (CELEXA) 20 MG tablet, Take 20 mg by mouth daily., Disp: , Rfl:  .  clonazePAM (KLONOPIN) 0.5 MG tablet, Take 0.5 mg by mouth 3 (three) times daily as needed for anxiety. , Disp: , Rfl:  .  DULoxetine (CYMBALTA) 30 MG capsule, Take  30 mg by mouth daily. Take once in the morning., Disp: , Rfl:  .  fexofenadine (ALLEGRA) 180 MG tablet, Take 180 mg by mouth 2 (two) times daily., Disp: , Rfl:  .  fludrocortisone (FLORINEF) 0.1 MG tablet, TAKE 1 TABLET(0.1 MG) BY MOUTH TWICE DAILY, Disp: 60 tablet, Rfl: 3 .  gabapentin (NEURONTIN) 300 MG capsule, Take 300 mg by mouth 3 (three) times daily. , Disp: , Rfl:  .  gabapentin (NEURONTIN) 800 MG tablet, Take 800 mg by mouth at bedtime as needed (neuropathy). , Disp: , Rfl:  .  HYDROcodone-acetaminophen (NORCO) 10-325 MG tablet, Take 1 tablet by mouth every 6 (six) hours as needed., Disp: , Rfl:  .  ipratropium (ATROVENT) 0.03 % nasal spray, Place 2 sprays into both nostrils 2 (two) times daily., Disp: , Rfl:  .  levothyroxine (SYNTHROID) 88 MCG tablet, TAKE 1 TABLET(88 MCG) BY MOUTH DAILY BEFORE BREAKFAST, Disp: 90 tablet, Rfl: 1 .  midodrine (PROAMATINE) 10 MG tablet, Take 1 tablet (10 mg total) by mouth 3 (three) times daily with meals., Disp: 270 tablet, Rfl: 3 .  mirtazapine (REMERON) 30 MG tablet, TAKE 1 TABLET BY MOUTH EVERY NIGHT AT BEDTIME, Disp: 30 tablet, Rfl: 3 .  montelukast (SINGULAIR) 10 MG tablet, Take 10 mg by mouth at bedtime. , Disp: , Rfl: 1 .  potassium chloride SA (KLOR-CON) 20 MEQ tablet, Take 1 tablet (20 mEq total) by mouth 2 (  two) times daily., Disp: 180 tablet, Rfl: 3 .  probenecid (BENEMID) 500 MG tablet, Take 500 mg by mouth 2 (two) times daily., Disp: , Rfl:  .  simvastatin (ZOCOR) 10 MG tablet, Take 10 mg by mouth every evening. , Disp: , Rfl:  .  spironolactone (ALDACTONE) 25 MG tablet, Take 0.5 tablets (12.5 mg total) by mouth every evening., Disp: 45 tablet, Rfl: 3 .  torsemide (DEMADEX) 20 MG tablet, Take 3 tablets (60 mg total) by mouth 2 (two) times daily., Disp: 540 tablet, Rfl: 3 .  buPROPion (WELLBUTRIN XL) 150 MG 24 hr tablet, Take 450 mg by mouth every morning.  (Patient not taking: Reported on 11/07/2019), Disp: , Rfl:  .  febuxostat (ULORIC) 40 MG  tablet, 40 mg daily. (Patient not taking: Reported on 11/07/2019), Disp: , Rfl:  .  metolazone (ZAROXOLYN) 2.5 MG tablet, TAKE ONLY AS DIRECTED BY CHF CLINIC (Patient not taking: Reported on 10/17/2019), Disp: 10 tablet, Rfl: 3 .  Nabumetone (RELAFEN PO), nabumetone (Patient not taking: Reported on 11/07/2019), Disp: , Rfl:  .  ondansetron (ZOFRAN) 8 MG tablet, , Disp: , Rfl:  .  zolpidem (AMBIEN) 10 MG tablet, Take 10 mg by mouth at bedtime as needed for sleep. For sleep  (Patient not taking: Reported on 11/07/2019), Disp: , Rfl:  Allergies  Allergen Reactions  . Ceftin Anaphylaxis    Face and throat swell   . Geodon [Ziprasidone Hcl] Hives  . Lisinopril Other (See Comments)    angioedema  . Shellfish Allergy Other (See Comments)    Gout exacerbation  . Allopurinol Nausea Only and Other (See Comments)    weakness  . Ativan [Lorazepam] Itching  . Sulfa Antibiotics Itching  . Ultram [Tramadol Hcl] Itching  . Valium [Diazepam] Other (See Comments)    Patient states that diazepam doesn't relax, it has the opposite effect.     Social History   Socioeconomic History  . Marital status: Married    Spouse name: Not on file  . Number of children: 2  . Years of education: 25  . Highest education level: Master's degree (e.g., MA, MS, MEng, MEd, MSW, MBA)  Occupational History  . Occupation: retired  Tobacco Use  . Smoking status: Former Smoker    Packs/day: 0.10    Years: 26.00    Pack years: 2.60    Types: Cigarettes    Quit date: 05/06/2007    Years since quitting: 12.5  . Smokeless tobacco: Never Used  Vaping Use  . Vaping Use: Never used  Substance and Sexual Activity  . Alcohol use: No  . Drug use: No  . Sexual activity: Yes  Other Topics Concern  . Not on file  Social History Narrative   Tobacco Use Cigarettes: Former Smoker, Quit in 2008   No Alcohol   No recreational drug use   Diet: Regular/Low Carb   Exercise: None   Occupation: disabled   Education: Research officer, political party,  masters   Children: 2   Firearms: No   Therapist, art Use: 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 Difficulty of Paying Living Expenses:   Food Insecurity:   . Worried About Charity fundraiser in the Last Year:   . Arboriculturist in the Last Year:   Transportation Needs:   . Film/video editor (Medical):   Marland Kitchen  Lack of Transportation (Non-Medical):   Physical Activity:   . Days of Exercise per Week:   . Minutes of Exercise per Session:   Stress:   . Feeling of Stress :   Social Connections:   . Frequency of Communication with Friends and Family:   . Frequency of Social Gatherings with Friends and Family:   . Attends Religious Services:   . Active Member of Clubs or Organizations:   . Attends Archivist Meetings:   Marland Kitchen Marital Status:   Intimate Partner Violence:   . Fear of Current or Ex-Partner:   . Emotionally Abused:   Marland Kitchen Physically Abused:   . Sexually Abused:     Physical Exam Vitals reviewed.  HENT:     Head: Normocephalic.     Nose: Nose normal.     Mouth/Throat:     Mouth: Mucous membranes are moist.     Pharynx: Oropharynx is clear.  Eyes:     Pupils: Pupils are equal, round, and reactive to light.  Cardiovascular:     Rate and Rhythm: Normal rate and regular rhythm.     Pulses: Normal pulses.     Heart sounds: Normal heart sounds.  Pulmonary:     Effort: Pulmonary effort is normal.     Breath sounds: Normal breath sounds.  Abdominal:     General: Abdomen is flat.     Palpations: Abdomen is soft.  Musculoskeletal:        General: Normal range of motion.     Cervical back: Normal range of motion.     Right lower leg: Edema present.     Left lower leg: Edema present.  Skin:    General: Skin is warm and dry.     Capillary Refill: Capillary refill takes less than 2 seconds.  Neurological:     Mental Status: She is alert. Mental status is at baseline.   Psychiatric:        Mood and Affect: Mood normal.      Arrived for home visit for Rhonda [name] who was seated at her kitchen table alert and oriented. Rhonda [name] reports feeling okay but sad about some family issues. I spoke to Rhonda [name] and reassured her to stay positive. She appreciated the conversation. Rhonda [name]'s vitals were obtained and are as noted. Medications were reviewed and confirmed. Pill box filled accordingly.   Yesterday I received phone call from [place] with [facility] asking me to inform [place] of them discharging her from [name] due to her fear of Rhonda [name]'s reaction and her becoming upset. I agreed to same and discussed same with Rhonda [name]. I spoke with Rhonda [name] at length about being discharged from [facility]. She verbalized understanding with same.   Rhonda [name] was educated with list of approved healthy frozen meals to replace her moms meals she will no longer be getting. Rhonda [name] understood same.   Rhonda [name] was reminded of appointment in clinic tomorrow with time, gate code and notified to bring all medications and pill box. Rhonda [name] understood same. I will see patient in clinic in the morning and in one week at home.    Home visit complete.   Refills: NONE    Future Appointments  Date Time Provider [place]  11/08/2019  9:20 AM Bensimhon, Shaune Pascal, MD [facility] None  11/17/2019  7:20 AM [facility] DEVICE REMOTES [facility]  11/20/2019  8:40 AM [facility] DEVICE REMOTES [facility]  12/18/2019 11:35 AM [facility]ICE REMOTES [facility]  01/17/2020  7:30 AM [facility]ICE REMOTES [facility]  04/17/2020  7:30 AM [facility]ICE REM[facility]  06/12/2020 10:00 AM Philemon Kingdom, MD [facility] None  06/17/2020  7:30 AM [facility] DEVICE REMOTES [facility]  07/17/2020  7:30 AM [facility] DEVICE [facility]OTES [facility]     ACTION: Home visit  completed Next visit planned for one week

## 2019-11-07 NOTE — Progress Notes (Addendum)
ADVANCED HF CLINIC NOTE  Patient ID: Rhonda [name], female   DOB: 1951/02/23, 69 y.o.   MRN: [id number]  Primary Cardiologist: Dr Radford Pax  General Surgeon: Dr Donne Hazel  Orthopedic: Dr Alvan Dame  PCP: Dr Justin Mend Referring MD: Dr Radford Pax  HPI: Rhonda [name] is a 69 y.o. 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 She has had sleep study with no  evidence of sleep apnea.  She has a Medtronic CRT-D device. Echo in 7/18 showed recovery of EF to 55-60%.    Has been followed by Narda Amber in Neurology. Has been on Florinef and midodrine for orthostatic hypotension (failed mestinon).   Today she returns for HF follow up.  Followed by HF Paramedicine Volume status up and down. Last week took metolazone for about 10 pound weight gain and volume status improved. Says she is still drinking a lot of water. + dyspnea on exertion. Continues with frequent falls due to poor balance and leg pain. Says sometimes she gets dizzy but mostly due to weakness and pain. Weight at home 200. No orthopnea or PND.   PYP 4/19 negative TTR  ICD checked personally. Activity ~1hr/day fluid level way down. 100% vpacing Personally reviewed  Echo 06/07/19: EF 60-65% grade II DD. RV ok. Personally reviewed   10/14/12 ECHO EF 35% RV ok  04/12/13 ECHO EF 20-25%, LV moderately dilated 10/11/2013 ECHO EF 45-50% 7/18 ECHO EF 55-60%, normal RV size and systolic function, PASP 34 mmHg  11/24/12 Creatinine 1.23 Potassium 4.1 12/15/12 Creatinine 2.03 Potassium 3.3  12/22/12 Creatinine 1.2 Potassium 3.8 02/23/13 Creatinine 1.68 K 4.4  05/02/13 K 4.2 Creatinine 1.8  10/11/13 K 3.7 Creatinine 1.69 10/17 K 4.1,  creatinine 1.35 09/2017: K3.8 Creatinine 1.57  Review of systems complete and found to be negative unless listed in HPI.   Past Medical History:  Diagnosis Date  . Anemia   . Arthritis    Right knee  . Asthma   . Back pain    Disk problem  . Breast cancer, stage 1 ([place]) 03/26/2011   Left; completed chemotherapy and radiation treatments  . Cardiomyopathy   . Chronic respiratory failure ([place]) 09/14/2013  . Chronic systolic heart failure ([facility])    a) NICM b) ECHO (03/2013) EF 20-25% c) ECHO (09/2013) EF 45-50%, grade I DD  . CKD (chronic kidney disease) stage 3, GFR 30-59 ml/min 11/08/2018  . Complication of anesthesia    History of low blood pressure after surgery; attributed to lying flat  . Diabetes mellitus    "diet controlled" (05/03/2013)  . Exertional shortness of breath   . Generalized anxiety disorder   . GERD (gastroesophageal reflux disease)   . Gout   . Heart murmur   . Hyperlipidemia   . Hypertension   . Hypokalemia 11/08/2018  . Hypothyroidism   . Left bundle branch block    s/p CRT-D (04/2013)  . Major depressive disorder   . Migraines   . On home oxygen therapy    "2L suppose to be q night" (05/03/2013)  . Orthostatic hypotension 07/28/2017  . Peripheral neuropathy    Feet  . SVD (spontaneous vaginal delivery)    x 2  . Unspecified vitamin D deficiency  03/26/2011   Does not take meds    Current Outpatient Medications  Medication Sig Dispense Refill  . acetaZOLAMIDE (DIAMOX) 125 MG tablet TAKE 1 TABLET(125 MG) BY MOUTH DAILY 90 tablet 3  . buPROPion (WELLBUTRIN XL) 150 MG 24 hr tablet Take 450 mg by mouth every morning.  (Patient not taking: Reported on 11/07/2019)    . carboxymethylcellulose (REFRESH PLUS) 0.5 % SOLN Place 1 drop into both eyes 3 (three) times daily as needed (dry eyes).    . carvedilol (COREG) 3.125 MG tablet TAKE 1 TABLET(3.125 MG) BY MOUTH TWICE DAILY WITH A MEAL 180 tablet 3  . citalopram (CELEXA) 20 MG tablet Take 20 mg by mouth daily.     . clonazePAM (KLONOPIN) 0.5 MG tablet Take 0.5 mg by mouth 3 (three) times daily as needed for anxiety.     . DULoxetine (CYMBALTA) 30 MG capsule Take 30 mg by mouth daily. Take once in the morning.    . febuxostat (ULORIC) 40 MG tablet 40 mg daily. (Patient not taking: Reported on 11/07/2019)    . fexofenadine (ALLEGRA) 180 MG tablet Take 180 mg by mouth 2 (two) times daily.    . fludrocortisone (FLORINEF) 0.1 MG tablet TAKE 1 TABLET(0.1 MG) BY MOUTH TWICE DAILY 60 tablet 3  . gabapentin (NEURONTIN) 300 MG capsule Take 300 mg by mouth 3 (three) times daily.     Marland Kitchen gabapentin (NEURONTIN) 800 MG tablet Take 800 mg by mouth at bedtime as needed (neuropathy).     Marland Kitchen HYDROcodone-acetaminophen (NORCO) 10-325 MG tablet Take 1 tablet by mouth every 6 (six) hours as needed.    Marland Kitchen ipratropium (ATROVENT) 0.03 % nasal spray Place 2 sprays into both nostrils 2 (two) times daily.    Marland Kitchen levothyroxine (SYNTHROID) 88 MCG tablet TAKE 1 TABLET(88 MCG) BY MOUTH DAILY BEFORE BREAKFAST 90 tablet 1  . metolazone (ZAROXOLYN) 2.5 MG tablet TAKE ONLY AS DIRECTED BY CHF CLINIC (Patient not taking: Reported on 10/17/2019) 10 tablet 3  . midodrine (PROAMATINE) 10 MG tablet Take 1 tablet (10 mg total) by mouth 3 (three) times daily with meals. 270 tablet 3  . mirtazapine (REMERON) 30 MG tablet TAKE 1 TABLET BY MOUTH EVERY NIGHT AT BEDTIME 30 tablet 3  . montelukast (SINGULAIR) 10 MG tablet Take 10 mg by mouth at bedtime.   1  . Nabumetone (RELAFEN PO) nabumetone (Patient not taking: Reported on 11/07/2019)    . ondansetron (ZOFRAN) 8 MG tablet  (Patient not taking: Reported on 11/07/2019)    . potassium chloride SA (KLOR-CON) 20 MEQ tablet Take 1 tablet (20 mEq total) by mouth 2 (two) times daily. 180 tablet 3  . probenecid (BENEMID) 500 MG tablet Take 500 mg by mouth 2 (two) times daily.    . simvastatin (ZOCOR) 10 MG tablet Take 10 mg by mouth every evening.     Marland Kitchen spironolactone (ALDACTONE) 25 MG tablet Take 0.5 tablets (12.5 mg  total) by mouth every evening. 45 tablet 3  . torsemide (DEMADEX) 20 MG tablet Take 3 tablets (60 mg total) by mouth 2 (two) times daily. 540 tablet 3  . zolpidem (AMBIEN) 10 MG tablet Take 10 mg by mouth at bedtime as needed for sleep. For sleep  (Patient not taking: Reported on 11/07/2019)     No current facility-administered medications for this encounter.    Vitals:   11/08/19 0912  BP: 122/62  Pulse: 88  SpO2: 99%    Wt Readings from Last 3 Encounters:  11/07/19 90.7 kg (  200 lb)  10/24/19 93.4 kg (206 lb)  10/17/19 90.9 kg (200 lb 6.4 oz)    PHYSICAL EXAM: General:  Elderly  No resp difficulty HEENT: normal Neck: supple. no JVD. Carotids 2+ bilat; no bruits. No lymphadenopathy or thryomegaly appreciated. Cor: PMI nondisplaced. Regular rate & rhythm. No rubs, gallops or murmurs. Lungs: clear + back brace Abdomen: obese soft, nontender, nondistended. No hepatosplenomegaly. No bruits or masses. Good bowel sounds. Extremities: no cyanosis, clubbing, rash, 2+ edema Neuro: alert & orientedx3, cranial nerves grossly intact. moves all 4 extremities w/o difficulty. Affect pleasant  ICD   ASSESSMENT & PLAN:  1) Chronic systolic HF: NICM s/p Medtronic CRT-D, ?Viral myocarditis. Cardiomyopathy dates from 25. EF 45-50% (09/2013), EF up to 55-60% on 20202.  PYP 09/02/17 negative for TTR (Grade 0-1, H/CCL 1.2). SPEP with no M-spike.  - Echo 06/07/19: EF 60-65% grade II DD. RV ok.  - Chronic NYHA III. - On low dose carvedilol 3.125 bid - On spiro 12.5 mg daily.  - On torsemide 60/40  - Volume status improved with metolazone. Drinking a lot of fluid. Will give metolazone 2.5 mg every Tuesday with KCl 40 - Reinforced need for daily weights and reviewed use of sliding scale diuretics. -  Continue HF paramedicine.  - Check labs today - ICD interrogated personally. See report above.  2) Obesity -Body mass index is 38.62 kg/m. - continue weight loss efforts 3) HTN - Blood pressure  well controlled. Continue current regimen. 4) Orthostatic Hypotension.  - Neurology managing. She is also on diamox for increased ICP - Continue Florinef and midodrine to 10 mg TID.  5) Chronic Venous stasis - plan as above. Continue compression stockings and elevation 6) Falls  - Ongoing issue due to orthostasis and weakness. Improved. SeeingNeurology.  7) CKD 3 - Creatinine baseline 1.4-1.6 - check BMET today  Glori Bickers MD 10:45 PM

## 2019-11-08 ENCOUNTER — Telehealth (HOSPITAL_COMMUNITY): Payer: Self-pay | Admitting: *Deleted

## 2019-11-08 ENCOUNTER — Ambulatory Visit (HOSPITAL_COMMUNITY)
Admission: RE | Admit: 2019-11-08 | Discharge: 2019-11-08 | Disposition: A | Discharge status: Home / Self Care | Payer: Medicare HMO | Source: Ambulatory Visit | Attending: Internal Medicine | Admitting: Internal Medicine

## 2019-11-08 ENCOUNTER — Other Ambulatory Visit: Payer: Self-pay

## 2019-11-08 ENCOUNTER — Other Ambulatory Visit (HOSPITAL_COMMUNITY): Payer: Self-pay

## 2019-11-08 ENCOUNTER — Encounter (HOSPITAL_COMMUNITY): Payer: Self-pay | Admitting: Internal Medicine

## 2019-11-08 VITALS — BP 122/62 | HR 88 | Wt 204.4 lb

## 2019-11-08 DIAGNOSIS — Z9221 Personal history of antineoplastic chemotherapy: Secondary | ICD-10-CM | POA: Diagnosis not present

## 2019-11-08 DIAGNOSIS — M1990 Unspecified osteoarthritis, unspecified site: Secondary | ICD-10-CM | POA: Insufficient documentation

## 2019-11-08 DIAGNOSIS — E1122 Type 2 diabetes mellitus with diabetic chronic kidney disease: Secondary | ICD-10-CM | POA: Insufficient documentation

## 2019-11-08 DIAGNOSIS — F419 Anxiety disorder, unspecified: Secondary | ICD-10-CM | POA: Insufficient documentation

## 2019-11-08 DIAGNOSIS — F329 Major depressive disorder, single episode, unspecified: Secondary | ICD-10-CM | POA: Insufficient documentation

## 2019-11-08 DIAGNOSIS — Z95810 Presence of automatic (implantable) cardiac defibrillator: Secondary | ICD-10-CM | POA: Diagnosis not present

## 2019-11-08 DIAGNOSIS — Z923 Personal history of irradiation: Secondary | ICD-10-CM | POA: Diagnosis not present

## 2019-11-08 DIAGNOSIS — I130 Hypertensive heart and chronic kidney disease with heart failure and stage 1 through stage 4 chronic kidney disease, or unspecified chronic kidney disease: Secondary | ICD-10-CM | POA: Insufficient documentation

## 2019-11-08 DIAGNOSIS — I5032 Chronic diastolic (congestive) heart failure: Secondary | ICD-10-CM | POA: Diagnosis not present

## 2019-11-08 DIAGNOSIS — Z791 Long term (current) use of non-steroidal anti-inflammatories (NSAID): Secondary | ICD-10-CM | POA: Insufficient documentation

## 2019-11-08 DIAGNOSIS — E669 Obesity, unspecified: Secondary | ICD-10-CM | POA: Insufficient documentation

## 2019-11-08 DIAGNOSIS — E785 Hyperlipidemia, unspecified: Secondary | ICD-10-CM | POA: Diagnosis not present

## 2019-11-08 DIAGNOSIS — Z853 Personal history of malignant neoplasm of breast: Secondary | ICD-10-CM | POA: Insufficient documentation

## 2019-11-08 DIAGNOSIS — E039 Hypothyroidism, unspecified: Secondary | ICD-10-CM | POA: Insufficient documentation

## 2019-11-08 DIAGNOSIS — Z950 Presence of cardiac pacemaker: Secondary | ICD-10-CM | POA: Diagnosis not present

## 2019-11-08 DIAGNOSIS — Z79899 Other long term (current) drug therapy: Secondary | ICD-10-CM | POA: Insufficient documentation

## 2019-11-08 DIAGNOSIS — M109 Gout, unspecified: Secondary | ICD-10-CM | POA: Insufficient documentation

## 2019-11-08 DIAGNOSIS — I872 Venous insufficiency (chronic) (peripheral): Secondary | ICD-10-CM

## 2019-11-08 DIAGNOSIS — I5022 Chronic systolic (congestive) heart failure: Secondary | ICD-10-CM | POA: Diagnosis not present

## 2019-11-08 DIAGNOSIS — Z9981 Dependence on supplemental oxygen: Secondary | ICD-10-CM | POA: Diagnosis not present

## 2019-11-08 DIAGNOSIS — I951 Orthostatic hypotension: Secondary | ICD-10-CM | POA: Insufficient documentation

## 2019-11-08 DIAGNOSIS — J9610 Chronic respiratory failure, unspecified whether with hypoxia or hypercapnia: Secondary | ICD-10-CM | POA: Insufficient documentation

## 2019-11-08 DIAGNOSIS — I428 Other cardiomyopathies: Secondary | ICD-10-CM | POA: Insufficient documentation

## 2019-11-08 DIAGNOSIS — K219 Gastro-esophageal reflux disease without esophagitis: Secondary | ICD-10-CM | POA: Insufficient documentation

## 2019-11-08 DIAGNOSIS — Z6838 Body mass index (BMI) 38.0-38.9, adult: Secondary | ICD-10-CM | POA: Diagnosis not present

## 2019-11-08 DIAGNOSIS — J45909 Unspecified asthma, uncomplicated: Secondary | ICD-10-CM | POA: Insufficient documentation

## 2019-11-08 DIAGNOSIS — N183 Chronic kidney disease, stage 3 unspecified: Secondary | ICD-10-CM

## 2019-11-08 LAB — BRAIN NATRIURETIC PEPTIDE: B Natriuretic Peptide: 68.6 pg/mL (ref 0.0–100.0)

## 2019-11-08 LAB — BASIC METABOLIC PANEL
Anion gap: 12 (ref 5–15)
BUN: 26 mg/dL — ABNORMAL HIGH (ref 8–23)
CO2: 24 mmol/L (ref 22–32)
Calcium: 8.9 mg/dL (ref 8.9–10.3)
Chloride: 105 mmol/L (ref 98–111)
Creatinine, Ser: 1.4 mg/dL — ABNORMAL HIGH (ref 0.44–1.00)
GFR calc Af Amer: 45 mL/min — ABNORMAL LOW (ref >60)
GFR calc non Af Amer: 39 mL/min — ABNORMAL LOW (ref >60)
Glucose, Bld: 95 mg/dL (ref 70–99)
Potassium: 3.3 mmol/L — ABNORMAL LOW (ref 3.5–5.1)
Sodium: 141 mmol/L (ref 135–145)

## 2019-11-08 MED ORDER — POTASSIUM CHLORIDE CRYS ER 20 MEQ PO TBCR: 40.0000 meq | EXTENDED_RELEASE_TABLET | Freq: Two times a day (BID) | ORAL | 3 refills | Status: DC

## 2019-11-08 MED ORDER — METOLAZONE 2.5 MG PO TABS: ORAL_TABLET | ORAL | 3 refills | Status: DC

## 2019-11-08 MED ORDER — POTASSIUM CHLORIDE CRYS ER 20 MEQ PO TBCR: EXTENDED_RELEASE_TABLET | ORAL | 3 refills | Status: DC

## 2019-11-08 NOTE — Progress Notes (Signed)
Paramedicine Encounter    Patient ID: Rhonda [name], female    DOB: 01/11/1951, 69 y.o.   MRN: [id number]   Patient Care Team: Maurice Small, MD as PCP - General (Family Medicine) [name], LCSW as Social Worker (Licensed Clinical Social Worker) [name], [name], DO as Consulting Physician (Neurology)  Patient Active Problem List   Diagnosis Date Noted  . Diabetes mellitus ([place]) 06/15/2019  . Hypokalemia 11/08/2018  . CKD (chronic kidney disease) stage 3, GFR 30-59 ml/min 11/08/2018  . Orthostatic hypotension 07/28/2017  . SVD (spontaneous vaginal delivery)   . Peripheral neuropathy   . On home oxygen therapy   . Migraines   . Left bundle branch block   . Hypothyroidism   . Hypertension   . Hyperlipidemia   . Heart murmur   . GERD (gastroesophageal reflux disease)   . Exertional shortness of breath   . Major depressive disorder   . Back pain   . Arthritis   . Generalized anxiety disorder   . Anemia   . Chronic respiratory failure ([place]) 09/14/2013  . 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 Morbid obesity ([place]) 11/01/2012  . Chronic systolic heart failure ([place]) 10/27/2012  . Endometrial polyp 01/20/2012  . Malignant tumor of breast ([place]) 03/26/2011  . Unspecified vitamin D deficiency 03/26/2011    Current Outpatient Medications:  .  acetaZOLAMIDE (DIAMOX) 125 MG tablet, TAKE 1 TABLET(125 MG) BY MOUTH DAILY, Disp: 90 tablet, Rfl: 3 .  carboxymethylcellulose (REFRESH PLUS) 0.5 % SOLN, Place 1 drop into both eyes 3 (three) times daily as needed (dry eyes)., Disp: , Rfl:  .  carvedilol (COREG) 3.125 MG tablet, TAKE 1 TABLET(3.125 MG) BY MOUTH TWICE DAILY WITH A MEAL, Disp: 180 tablet, Rfl: 3 .  citalopram (CELEXA) 20 MG tablet, Take 20 mg by mouth daily., Disp: , Rfl:  .  clonazePAM (KLONOPIN) 0.5 MG tablet, Take 0.5 mg by mouth 3 (three) times daily as needed for anxiety. , Disp: , Rfl:  .  DULoxetine (CYMBALTA) 30 MG capsule, Take  30 mg by mouth daily. Take once in the morning., Disp: , Rfl:  .  fexofenadine (ALLEGRA) 180 MG tablet, Take 180 mg by mouth 2 (two) times daily., Disp: , Rfl:  .  fludrocortisone (FLORINEF) 0.1 MG tablet, TAKE 1 TABLET(0.1 MG) BY MOUTH TWICE DAILY, Disp: 60 tablet, Rfl: 3 .  gabapentin (NEURONTIN) 300 MG capsule, Take 300 mg by mouth 3 (three) times daily. , Disp: , Rfl:  .  gabapentin (NEURONTIN) 800 MG tablet, Take 800 mg by mouth at bedtime as needed (neuropathy). , Disp: , Rfl:  .  HYDROcodone-acetaminophen (NORCO) 10-325 MG tablet, Take 1 tablet by mouth every 6 (six) hours as needed., Disp: , Rfl:  .  ipratropium (ATROVENT) 0.03 % nasal spray, Place 2 sprays into both nostrils 2 (two) times daily., Disp: , Rfl:  .  levothyroxine (SYNTHROID) 88 MCG tablet, TAKE 1 TABLET(88 MCG) BY MOUTH DAILY BEFORE BREAKFAST, Disp: 90 tablet, Rfl: 1 .  metolazone (ZAROXOLYN) 2.5 MG tablet, Take 1 tablet by mouth every Tuesday, Disp: 12 tablet, Rfl: 3 .  midodrine (PROAMATINE) 10 MG tablet, Take 1 tablet (10 mg total) by mouth 3 (three) times daily with meals., Disp: 270 tablet, Rfl: 3 .  mirtazapine (REMERON) 30 MG tablet, TAKE 1 TABLET BY MOUTH EVERY NIGHT AT BEDTIME, Disp: 30 tablet, Rfl: 3 .  montelukast (SINGULAIR) 10 MG tablet, Take 10 mg by mouth at bedtime. , Disp: , Rfl:  1 .  potassium chloride SA (KLOR-CON) 20 MEQ tablet, Take 1 tablet by mouth twice daily. Take an extra 59mq(2 tablets) on the days you take the metolazone., Disp: 180 tablet, Rfl: 3 .  probenecid (BENEMID) 500 MG tablet, Take 500 mg by mouth 2 (two) times daily., Disp: , Rfl:  .  simvastatin (ZOCOR) 10 MG tablet, Take 10 mg by mouth every evening. , Disp: , Rfl:  .  spironolactone (ALDACTONE) 25 MG tablet, Take 0.5 tablets (12.5 mg total) by mouth every evening., Disp: 45 tablet, Rfl: 3 .  torsemide (DEMADEX) 20 MG tablet, Take 3 tablets (60 mg total) by mouth 2 (two) times daily., Disp: 540 tablet, Rfl: 3 .  ondansetron (ZOFRAN) 8  MG tablet, , Disp: , Rfl:  Allergies  Allergen Reactions  . Ceftin Anaphylaxis    Face and throat swell   . Geodon [Ziprasidone Hcl] Hives  . Lisinopril Other (See Comments)    angioedema  . Shellfish Allergy Other (See Comments)    Gout exacerbation  . Allopurinol Nausea Only and Other (See Comments)    weakness  . Ativan [Lorazepam] Itching  . Sulfa Antibiotics Itching  . Ultram [Tramadol Hcl] Itching  . Valium [Diazepam] Other (See Comments)    Patient states that diazepam doesn't relax, it has the opposite effect.     Social History   Socioeconomic History  . Marital status: Married    Spouse name: Not on file  . Number of children: 2  . Years of education: 163 . Highest education level: Master's degree (e.g., MA, MS, MEng, MEd, MSW, MBA)  Occupational History  . Occupation: retired  Tobacco Use  . Smoking status: Former Smoker    Packs/day: 0.10    Years: 26.00    Pack years: 2.60    Types: Cigarettes    Quit date: 05/06/2007    Years since quitting: 12.5  . Smokeless tobacco: Never Used  Vaping Use  . Vaping Use: Never used  Substance and Sexual Activity  . Alcohol use: No  . Drug use: No  . Sexual activity: Yes  Other Topics Concern  . Not on file  Social History Narrative   Tobacco Use Cigarettes: Former Smoker, Quit in 2008   No Alcohol   No recreational drug use   Diet: Regular/Low Carb   Exercise: None   Occupation: disabled   Education: CResearch officer, political party masters   Children: 2   Firearms: No   STherapist, artUse: Always   Former aMetallurgist    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      Social Determinants of Health   Financial Resource Strain:   . Difficulty of Paying Living Expenses:   Food Insecurity:   . Worried About RCharity fundraiserin the Last Year:   . RArboriculturistin the Last Year:   Transportation Needs:   . LFilm/video editor(Medical):   .Marland KitchenLack of Transportation (Non-Medical):   Physical Activity:   . Days of Exercise per Week:    . Minutes of Exercise per Session:   Stress:   . Feeling of Stress :   Social Connections:   . Frequency of Communication with Friends and Family:   . Frequency of Social Gatherings with Friends and Family:   . Attends Religious Services:   . Active Member of Clubs or Organizations:   . Attends CArchivistMeetings:   .Marland KitchenMarital Status:   Intimate Partner Violence:   .  Fear of Current or Ex-Partner:   . Emotionally Abused:   Marland Kitchen Physically Abused:   . Sexually Abused:     Physical Exam   Met with Rhonda [name] in the clinic today where she was seen by Dr. Haroldine Laws. Dr. Haroldine Laws reports fluid had decreased after metolazone dose. Dr. Haroldine Laws ordered weekly metolazone with extra potassium weekly on Tuesdays. [place] was given same today and will follow up next Tuesday with reoccurring weekly doses if needed. Vitals are as noted. Medications were reviewed and verified.  Labs were drawn and potassium noted to be low. Dr. Haroldine Laws ordered 40Meq of potassium BID with repeat labs in two weeks. Medication corrected in pill box.   I will see patient in home next Tuesday.     Future Appointments  Date Time Provider [place]  11/17/2019  7:20 AM [facility] DEVICE REMOTES [facility]  11/20/2019  8:40 AM [facility] DEVICE REMOTES [facility]  11/22/2019 12:00 PM [facility] LAB [facility] None  12/18/2019 11:35 AM [facility] DEVICE REMOTES [facility]  01/17/2020  7:30 AM [facility] DEVICE REMOTES [facility]  03/14/2020 11:00 AM [facility] PA/NP [facility] None  04/17/2020  7:30 AM [facility] DEVICE REMOTES [facility]  06/12/2020 10:00 AM Philemon Kingdom, MD [facility] None  06/17/2020  7:30 AM [facility] DEVICE REMOTES [facility]  07/17/2020  7:30 AM [facility] DEVICE REMOTES [facility]     ACTION: Home visit completed Next visit planned for one week

## 2019-11-08 NOTE — Telephone Encounter (Signed)
-----   Message from Jolaine Artist, MD sent at 11/08/2019 11:23 AM EDT ----- Increase potassium to 40 bid. Repeat BMET 1 week.

## 2019-11-08 NOTE — Patient Instructions (Addendum)
Labs done today. We will contact you only if your labs are abnormal.  TAKE Metolazone 2.5mg (1 tablet) by mouth every Tuesday. Take an extra 40meq(2 tablets) of Potassium on the days you take your Metolazone.   No other medication changes were made. Please continue all other medications as prescribed.  Your physician recommends that you schedule a follow-up appointment in: 2 weeks for a lab only appointment and in 4 months for an appointment with our PA/NP clinic(here in the office)  If you have any questions or concerns before your next appointment please send [place] a message through [employer] or call our office at [phone].    TO LEAVE A MESSAGE FOR THE NURSE SELECT OPTION 2, PLEASE LEAVE A MESSAGE INCLUDING: . YOUR NAME . DATE OF BIRTH . CALL BACK NUMBER . REASON FOR CALL**this is important as we prioritize the call backs  [place] AS LONG AS YOU CALL BEFORE 4:00 PM   At the [facility], you and your health needs are our priority. As part of our continuing mission to provide you with exceptional heart care, we have created designated Provider Care Teams. These Care Teams include your primary Cardiologist (physician) and Advanced Practice Providers (APPs- Physician Assistants and Nurse Practitioners) who all work together to provide you with the care you need, when you need it.   You may see any of the following providers on your designated Care Team at your next follow up: Marland Kitchen Dr Glori Bickers . Dr Loralie Champagne . Darrick Grinder, NP . Lyda Jester, PA . Audry Riles, PharmD   Please be sure to bring in all your medications bottles to every appointment.

## 2019-11-08 NOTE — Telephone Encounter (Signed)
Spoke w/Euva Rundell pt's community paramedic, she is aware of changes and will update pt's pill box, repeat labs already scheduled

## 2019-11-10 ENCOUNTER — Other Ambulatory Visit (HOSPITAL_COMMUNITY): Payer: Self-pay

## 2019-11-10 DIAGNOSIS — M48061 Spinal stenosis, lumbar region without neurogenic claudication: Secondary | ICD-10-CM | POA: Diagnosis not present

## 2019-11-10 DIAGNOSIS — M4856XD Collapsed vertebra, not elsewhere classified, lumbar region, subsequent encounter for fracture with routine healing: Secondary | ICD-10-CM | POA: Diagnosis not present

## 2019-11-10 DIAGNOSIS — M4316 Spondylolisthesis, lumbar region: Secondary | ICD-10-CM | POA: Diagnosis not present

## 2019-11-10 DIAGNOSIS — Z7951 Long term (current) use of inhaled steroids: Secondary | ICD-10-CM | POA: Diagnosis not present

## 2019-11-10 DIAGNOSIS — I2510 Atherosclerotic heart disease of native coronary artery without angina pectoris: Secondary | ICD-10-CM | POA: Diagnosis not present

## 2019-11-10 DIAGNOSIS — Z79891 Long term (current) use of opiate analgesic: Secondary | ICD-10-CM | POA: Diagnosis not present

## 2019-11-10 DIAGNOSIS — Z9181 History of falling: Secondary | ICD-10-CM | POA: Diagnosis not present

## 2019-11-10 MED ORDER — SPIRONOLACTONE 25 MG PO TABS: 12.5000 mg | ORAL_TABLET | Freq: Every evening | ORAL | 3 refills | Status: DC

## 2019-11-14 ENCOUNTER — Other Ambulatory Visit (HOSPITAL_COMMUNITY): Payer: Self-pay

## 2019-11-14 DIAGNOSIS — Z7951 Long term (current) use of inhaled steroids: Secondary | ICD-10-CM | POA: Diagnosis not present

## 2019-11-14 DIAGNOSIS — M4856XD Collapsed vertebra, not elsewhere classified, lumbar region, subsequent encounter for fracture with routine healing: Secondary | ICD-10-CM | POA: Diagnosis not present

## 2019-11-14 DIAGNOSIS — Z9181 History of falling: Secondary | ICD-10-CM | POA: Diagnosis not present

## 2019-11-14 DIAGNOSIS — I2510 Atherosclerotic heart disease of native coronary artery without angina pectoris: Secondary | ICD-10-CM | POA: Diagnosis not present

## 2019-11-14 DIAGNOSIS — M48061 Spinal stenosis, lumbar region without neurogenic claudication: Secondary | ICD-10-CM | POA: Diagnosis not present

## 2019-11-14 DIAGNOSIS — Z79891 Long term (current) use of opiate analgesic: Secondary | ICD-10-CM | POA: Diagnosis not present

## 2019-11-14 DIAGNOSIS — M4316 Spondylolisthesis, lumbar region: Secondary | ICD-10-CM | POA: Diagnosis not present

## 2019-11-14 NOTE — Progress Notes (Signed)
Paramedicine Encounter    Patient ID: Rhonda [name], female    DOB: 1950-11-15, 69 y.o.   MRN: [id number]   Patient Care Team: Maurice Small, MD as PCP - General (Family Medicine) [name], LCSW as Social Worker (Licensed Clinical Social Worker) [name], [name], DO as Consulting Physician (Neurology)  Patient Active Problem List   Diagnosis Date Noted  . Diabetes mellitus ([place]) 06/15/2019  . Hypokalemia 11/08/2018  . CKD (chronic kidney disease) stage 3, GFR 30-59 ml/min 11/08/2018  . Orthostatic hypotension 07/28/2017  . SVD (spontaneous vaginal delivery)   . Peripheral neuropathy   . On home oxygen therapy   . Migraines   . Left bundle branch block   . Hypothyroidism   . Hypertension   . Hyperlipidemia   . Heart murmur   . GERD (gastroesophageal reflux disease)   . Exertional shortness of breath   . Major depressive disorder   . Back pain   . Arthritis   . Generalized anxiety disorder   . Anemia   . Chronic respiratory failure ([place]) 09/14/2013  . 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 Morbid obesity ([place]) 11/01/2012  . Chronic systolic heart failure ([place]) 10/27/2012  . Endometrial polyp 01/20/2012  . Malignant tumor of breast ([place]) 03/26/2011  . Unspecified vitamin D deficiency 03/26/2011    Current Outpatient Medications:  .  acetaZOLAMIDE (DIAMOX) 125 MG tablet, TAKE 1 TABLET(125 MG) BY MOUTH DAILY, Disp: 90 tablet, Rfl: 3 .  carboxymethylcellulose (REFRESH PLUS) 0.5 % SOLN, Place 1 drop into both eyes 3 (three) times daily as needed (dry eyes)., Disp: , Rfl:  .  carvedilol (COREG) 3.125 MG tablet, TAKE 1 TABLET(3.125 MG) BY MOUTH TWICE DAILY WITH A MEAL, Disp: 180 tablet, Rfl: 3 .  citalopram (CELEXA) 20 MG tablet, Take 20 mg by mouth daily., Disp: , Rfl:  .  clonazePAM (KLONOPIN) 0.5 MG tablet, Take 0.5 mg by mouth 3 (three) times daily as needed for anxiety. , Disp: , Rfl:  .  DULoxetine (CYMBALTA) 30 MG capsule, Take  30 mg by mouth daily. Take once in the morning., Disp: , Rfl:  .  fexofenadine (ALLEGRA) 180 MG tablet, Take 180 mg by mouth 2 (two) times daily., Disp: , Rfl:  .  fludrocortisone (FLORINEF) 0.1 MG tablet, TAKE 1 TABLET(0.1 MG) BY MOUTH TWICE DAILY, Disp: 60 tablet, Rfl: 3 .  gabapentin (NEURONTIN) 300 MG capsule, Take 300 mg by mouth 3 (three) times daily. , Disp: , Rfl:  .  gabapentin (NEURONTIN) 800 MG tablet, Take 800 mg by mouth at bedtime as needed (neuropathy). , Disp: , Rfl:  .  HYDROcodone-acetaminophen (NORCO) 10-325 MG tablet, Take 1 tablet by mouth every 6 (six) hours as needed., Disp: , Rfl:  .  ipratropium (ATROVENT) 0.03 % nasal spray, Place 2 sprays into both nostrils 2 (two) times daily., Disp: , Rfl:  .  levothyroxine (SYNTHROID) 88 MCG tablet, TAKE 1 TABLET(88 MCG) BY MOUTH DAILY BEFORE BREAKFAST, Disp: 90 tablet, Rfl: 1 .  metolazone (ZAROXOLYN) 2.5 MG tablet, Take 1 tablet by mouth every Tuesday, Disp: 12 tablet, Rfl: 3 .  midodrine (PROAMATINE) 10 MG tablet, Take 1 tablet (10 mg total) by mouth 3 (three) times daily with meals., Disp: 270 tablet, Rfl: 3 .  mirtazapine (REMERON) 30 MG tablet, TAKE 1 TABLET BY MOUTH EVERY NIGHT AT BEDTIME, Disp: 30 tablet, Rfl: 3 .  montelukast (SINGULAIR) 10 MG tablet, Take 10 mg by mouth at bedtime. , Disp: , Rfl:  1 .  potassium chloride SA (KLOR-CON) 20 MEQ tablet, Take 2 tablets (40 mEq total) by mouth 2 (two) times daily. Take an extra 27meq(2 tablets) on the days you take the metolazone., Disp: 260 tablet, Rfl: 3 .  probenecid (BENEMID) 500 MG tablet, Take 500 mg by mouth 2 (two) times daily., Disp: , Rfl:  .  simvastatin (ZOCOR) 10 MG tablet, Take 10 mg by mouth every evening. , Disp: , Rfl:  .  spironolactone (ALDACTONE) 25 MG tablet, Take 0.5 tablets (12.5 mg total) by mouth every evening., Disp: 45 tablet, Rfl: 3 .  torsemide (DEMADEX) 20 MG tablet, Take 3 tablets (60 mg total) by mouth 2 (two) times daily., Disp: 540 tablet, Rfl: 3 .   ondansetron (ZOFRAN) 8 MG tablet, , Disp: , Rfl:  Allergies  Allergen Reactions  . Ceftin Anaphylaxis    Face and throat swell   . Geodon [Ziprasidone Hcl] Hives  . Lisinopril Other (See Comments)    angioedema  . Shellfish Allergy Other (See Comments)    Gout exacerbation  . Allopurinol Nausea Only and Other (See Comments)    weakness  . Ativan [Lorazepam] Itching  . Sulfa Antibiotics Itching  . Ultram [Tramadol Hcl] Itching  . Valium [Diazepam] Other (See Comments)    Patient states that diazepam doesn't relax, it has the opposite effect.     Social History   Socioeconomic History  . Marital status: Married    Spouse name: Not on file  . Number of children: 2  . Years of education: 1  . Highest education level: Master's degree (e.g., MA, MS, MEng, MEd, MSW, MBA)  Occupational History  . Occupation: retired  Tobacco Use  . Smoking status: Former Smoker    Packs/day: 0.10    Years: 26.00    Pack years: 2.60    Types: Cigarettes    Quit date: 05/06/2007    Years since quitting: 12.5  . Smokeless tobacco: Never Used  Vaping Use  . Vaping Use: Never used  Substance and Sexual Activity  . Alcohol use: No  . Drug use: No  . Sexual activity: Yes  Other Topics Concern  . 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 Difficulty of Paying Living Expenses:   Food Insecurity:   . Worried About Charity fundraiser in the Last Year:   . Arboriculturist in the Last Year:   Transportation Needs:   . Film/video editor (Medical):   Marland Kitchen Lack of Transportation (Non-Medical):   Physical Activity:   .  Days of Exercise per Week:   . Minutes of Exercise per Session:   Stress:   . Feeling of Stress :   Social Connections:   . Frequency of Communication with Friends and Family:   . Frequency of Social Gatherings with Friends and Family:   . Attends Religious Services:   . Active Member of Clubs or Organizations:   . Attends Archivist Meetings:   Marland Kitchen Marital Status:  Intimate Partner Violence:   . Fear of Current or Ex-Partner:   . Emotionally Abused:   Marland Kitchen Physically Abused:   . Sexually Abused:     Physical Exam Vitals reviewed.  Constitutional:      Appearance: She is normal weight.  HENT:     Head: Normocephalic.     Nose: Nose normal.     Mouth/Throat:     Mouth: Mucous membranes are moist.  Eyes:     Pupils: Pupils are equal, round, and reactive to light.  Cardiovascular:     Rate and Rhythm: Normal rate and regular rhythm.     Pulses: Normal pulses.     Heart sounds: Normal heart sounds.  Pulmonary:     Effort: Pulmonary effort is normal.     Breath sounds: Normal breath sounds.  Abdominal:     General: Abdomen is flat.     Palpations: Abdomen is soft.  Musculoskeletal:        General: Tenderness present. Normal range of motion.     Cervical back: Normal range of motion.     Right lower leg: Edema present.     Left lower leg: Edema present.  Skin:    General: Skin is warm and dry.     Capillary Refill: Capillary refill takes less than 2 seconds.  Neurological:     Mental Status: She is alert. Mental status is at baseline.  Psychiatric:        Mood and Affect: Mood normal.    Arrived for home visit for Rhonda [name] who was seated in her living room alert and oriented reporting her legs were hurting really bad today. Rhonda [name] reports she had a birthday yesterday and went out and had a make over and went to dinner at [employer] and was having severe leg and feet pain so she did not eat her dinner and has not eat this morning or had her morning medications.  I assisted her in eating breakfast and taking her morning medications. Legs were swollen with minimal edema noted. Patient weight down 6 lbs this week. Patient reports she has been eating the meals I suggested and decreasing her fluid intake. Vitals were obtained and are as noted. Patient denied injury to legs just stated they "ache". Patient took prescribed pain medication for same. Rhonda [name] was made aware of Lab visit next week. I reviewed medications and filled pill box accordingly. Home visit complete. I will see patient in one week.   Refills:  Clonazepam Gabapentin 800mg       Future Appointments  Date Time Provider [place]  11/17/2019  7:20 AM [facility] DEVICE REMOTES [facility]  11/20/2019  8:40 AM [facility] DEVICE REMOTES [facility]  11/22/2019 12:00 PM [facility] LAB [facility] None  12/18/2019 11:35 AM [facility] DEVICE REMOTES [facility]  01/17/2020  7:30 AM [facility] DEVICE REMOTES [facility]  03/14/2020 11:00 AM [facility] PA/NP [facility] None  04/17/2020  7:30 AM [facility] DEVICE REMOTES [facility]  06/12/2020 10:00 AM Philemon Kingdom, MD [facility] None  06/17/2020  7:30 AM [facility] DEVICE REMOTES [facility]  07/17/2020  7:30 AM [facility] DEVICE REMOTES [facility]     ACTION: Home visit completed Next visit planned for one week

## 2019-11-17 ENCOUNTER — Telehealth: Payer: Self-pay

## 2019-11-17 ENCOUNTER — Ambulatory Visit (INDEPENDENT_AMBULATORY_CARE_PROVIDER_SITE_OTHER): Payer: Medicare HMO | Admitting: *Deleted

## 2019-11-17 DIAGNOSIS — I2510 Atherosclerotic heart disease of native coronary artery without angina pectoris: Secondary | ICD-10-CM | POA: Diagnosis not present

## 2019-11-17 DIAGNOSIS — Z9181 History of falling: Secondary | ICD-10-CM | POA: Diagnosis not present

## 2019-11-17 DIAGNOSIS — Z79891 Long term (current) use of opiate analgesic: Secondary | ICD-10-CM | POA: Diagnosis not present

## 2019-11-17 DIAGNOSIS — M4856XD Collapsed vertebra, not elsewhere classified, lumbar region, subsequent encounter for fracture with routine healing: Secondary | ICD-10-CM | POA: Diagnosis not present

## 2019-11-17 DIAGNOSIS — M48061 Spinal stenosis, lumbar region without neurogenic claudication: Secondary | ICD-10-CM | POA: Diagnosis not present

## 2019-11-17 DIAGNOSIS — M4316 Spondylolisthesis, lumbar region: Secondary | ICD-10-CM | POA: Diagnosis not present

## 2019-11-17 DIAGNOSIS — I428 Other cardiomyopathies: Secondary | ICD-10-CM

## 2019-11-17 DIAGNOSIS — Z7951 Long term (current) use of inhaled steroids: Secondary | ICD-10-CM | POA: Diagnosis not present

## 2019-11-17 NOTE — Telephone Encounter (Signed)
Left message for patient to remind of missed remote transmission.  

## 2019-11-20 ENCOUNTER — Ambulatory Visit (INDEPENDENT_AMBULATORY_CARE_PROVIDER_SITE_OTHER): Payer: Medicare HMO

## 2019-11-20 DIAGNOSIS — I5032 Chronic diastolic (congestive) heart failure: Secondary | ICD-10-CM

## 2019-11-20 DIAGNOSIS — Z95810 Presence of automatic (implantable) cardiac defibrillator: Secondary | ICD-10-CM

## 2019-11-21 ENCOUNTER — Telehealth: Payer: Self-pay

## 2019-11-21 ENCOUNTER — Other Ambulatory Visit: Payer: Self-pay | Admitting: Neurology

## 2019-11-21 ENCOUNTER — Other Ambulatory Visit (HOSPITAL_COMMUNITY): Payer: Self-pay

## 2019-11-21 DIAGNOSIS — Z79891 Long term (current) use of opiate analgesic: Secondary | ICD-10-CM | POA: Diagnosis not present

## 2019-11-21 DIAGNOSIS — I2510 Atherosclerotic heart disease of native coronary artery without angina pectoris: Secondary | ICD-10-CM | POA: Diagnosis not present

## 2019-11-21 DIAGNOSIS — Z9181 History of falling: Secondary | ICD-10-CM | POA: Diagnosis not present

## 2019-11-21 DIAGNOSIS — M4316 Spondylolisthesis, lumbar region: Secondary | ICD-10-CM | POA: Diagnosis not present

## 2019-11-21 DIAGNOSIS — Z7951 Long term (current) use of inhaled steroids: Secondary | ICD-10-CM | POA: Diagnosis not present

## 2019-11-21 DIAGNOSIS — M4856XD Collapsed vertebra, not elsewhere classified, lumbar region, subsequent encounter for fracture with routine healing: Secondary | ICD-10-CM | POA: Diagnosis not present

## 2019-11-21 DIAGNOSIS — M48061 Spinal stenosis, lumbar region without neurogenic claudication: Secondary | ICD-10-CM | POA: Diagnosis not present

## 2019-11-21 MED ORDER — GABAPENTIN 800 MG PO TABS: 800.0000 mg | ORAL_TABLET | Freq: Every evening | ORAL | 0 refills | Status: DC | PRN

## 2019-11-21 NOTE — Telephone Encounter (Signed)
Remote ICM transmission received.  Attempted call to patient regarding ICM remote transmission and left detailed message per DPR.  Advised to return call for any fluid symptoms or questions. Next ICM remote transmission scheduled 12/25/2019.   ° ° °

## 2019-11-21 NOTE — Progress Notes (Signed)
Paramedicine Encounter    Patient ID: Rhonda [name], female    DOB: March 14, 1951, 69 y.o.   MRN: [id number]   Patient Care Team: Maurice Small, MD as PCP - General (Family Medicine) [name], LCSW as Social Worker (Licensed Clinical Social Worker) [name], [name], DO as Consulting Physician (Neurology)  Patient Active Problem List   Diagnosis Date Noted  . Diabetes mellitus ([place]) 06/15/2019  . Hypokalemia 11/08/2018  . CKD (chronic kidney disease) stage 3, GFR 30-59 ml/min 11/08/2018  . Orthostatic hypotension 07/28/2017  . SVD (spontaneous vaginal delivery)   . Peripheral neuropathy   . On home oxygen therapy   . Migraines   . Left bundle branch block   . Hypothyroidism   . Hypertension   . Hyperlipidemia   . Heart murmur   . GERD (gastroesophageal reflux disease)   . Exertional shortness of breath   . Major depressive disorder   . Back pain   . Arthritis   . Generalized anxiety disorder   . Anemia   . Chronic respiratory failure ([place]) 09/14/2013  . 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 Morbid obesity ([place]) 11/01/2012  . Chronic systolic heart failure ([place]) 10/27/2012  . Endometrial polyp 01/20/2012  . Malignant tumor of breast ([place]) 03/26/2011  . Unspecified vitamin D deficiency 03/26/2011    Current Outpatient Medications:  .  acetaZOLAMIDE (DIAMOX) 125 MG tablet, TAKE 1 TABLET(125 MG) BY MOUTH DAILY, Disp: 90 tablet, Rfl: 3 .  carboxymethylcellulose (REFRESH PLUS) 0.5 % SOLN, Place 1 drop into both eyes 3 (three) times daily as needed (dry eyes)., Disp: , Rfl:  .  carvedilol (COREG) 3.125 MG tablet, TAKE 1 TABLET(3.125 MG) BY MOUTH TWICE DAILY WITH A MEAL, Disp: 180 tablet, Rfl: 3 .  citalopram (CELEXA) 20 MG tablet, Take 20 mg by mouth daily., Disp: , Rfl:  .  clonazePAM (KLONOPIN) 0.5 MG tablet, Take 0.5 mg by mouth 3 (three) times daily as needed for anxiety. , Disp: , Rfl:  .  DULoxetine (CYMBALTA) 30 MG capsule, Take  30 mg by mouth daily. Take once in the morning., Disp: , Rfl:  .  fexofenadine (ALLEGRA) 180 MG tablet, Take 180 mg by mouth 2 (two) times daily., Disp: , Rfl:  .  fludrocortisone (FLORINEF) 0.1 MG tablet, TAKE 1 TABLET(0.1 MG) BY MOUTH TWICE DAILY, Disp: 60 tablet, Rfl: 3 .  gabapentin (NEURONTIN) 300 MG capsule, Take 300 mg by mouth 3 (three) times daily. , Disp: , Rfl:  .  gabapentin (NEURONTIN) 800 MG tablet, Take 800 mg by mouth at bedtime as needed (neuropathy). , Disp: , Rfl:  .  HYDROcodone-acetaminophen (NORCO) 10-325 MG tablet, Take 1 tablet by mouth every 6 (six) hours as needed., Disp: , Rfl:  .  ipratropium (ATROVENT) 0.03 % nasal spray, Place 2 sprays into both nostrils 2 (two) times daily., Disp: , Rfl:  .  levothyroxine (SYNTHROID) 88 MCG tablet, TAKE 1 TABLET(88 MCG) BY MOUTH DAILY BEFORE BREAKFAST, Disp: 90 tablet, Rfl: 1 .  metolazone (ZAROXOLYN) 2.5 MG tablet, Take 1 tablet by mouth every Tuesday, Disp: 12 tablet, Rfl: 3 .  midodrine (PROAMATINE) 10 MG tablet, Take 1 tablet (10 mg total) by mouth 3 (three) times daily with meals., Disp: 270 tablet, Rfl: 3 .  mirtazapine (REMERON) 30 MG tablet, TAKE 1 TABLET BY MOUTH EVERY NIGHT AT BEDTIME, Disp: 30 tablet, Rfl: 3 .  montelukast (SINGULAIR) 10 MG tablet, Take 10 mg by mouth at bedtime. , Disp: , Rfl:  1 .  ondansetron (ZOFRAN) 8 MG tablet, , Disp: , Rfl:  .  potassium chloride SA (KLOR-CON) 20 MEQ tablet, Take 2 tablets (40 mEq total) by mouth 2 (two) times daily. Take an extra 69meq(2 tablets) on the days you take the metolazone., Disp: 260 tablet, Rfl: 3 .  probenecid (BENEMID) 500 MG tablet, Take 500 mg by mouth 2 (two) times daily., Disp: , Rfl:  .  simvastatin (ZOCOR) 10 MG tablet, Take 10 mg by mouth every evening. , Disp: , Rfl:  .  spironolactone (ALDACTONE) 25 MG tablet, Take 0.5 tablets (12.5 mg total) by mouth every evening., Disp: 45 tablet, Rfl: 3 .  torsemide (DEMADEX) 20 MG tablet, Take 3 tablets (60 mg total) by  mouth 2 (two) times daily., Disp: 540 tablet, Rfl: 3 Allergies  Allergen Reactions  . Ceftin Anaphylaxis    Face and throat swell   . Geodon [Ziprasidone Hcl] Hives  . Lisinopril Other (See Comments)    angioedema  . Shellfish Allergy Other (See Comments)    Gout exacerbation  . Allopurinol Nausea Only and Other (See Comments)    weakness  . Ativan [Lorazepam] Itching  . Sulfa Antibiotics Itching  . Ultram [Tramadol Hcl] Itching  . Valium [Diazepam] Other (See Comments)    Patient states that diazepam doesn't relax, it has the opposite effect.     Social History   Socioeconomic History  . Marital status: Married    Spouse name: Not on file  . Number of children: 2  . Years of education: 38  . Highest education level: Master's degree (e.g., MA, MS, MEng, MEd, MSW, MBA)  Occupational History  . Occupation: retired  Tobacco Use  . Smoking status: Former Smoker    Packs/day: 0.10    Years: 26.00    Pack years: 2.60    Types: Cigarettes    Quit date: 05/06/2007    Years since quitting: 12.5  . Smokeless tobacco: Never Used  Vaping Use  . Vaping Use: Never used  Substance and Sexual Activity  . Alcohol use: No  . Drug use: No  . Sexual activity: Yes  Other Topics Concern  . 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 Difficulty of Paying Living Expenses:   Food Insecurity:   . Worried About Charity fundraiser in the Last Year:   . Arboriculturist in the Last Year:   Transportation Needs:   . Film/video editor (Medical):   Marland Kitchen Lack of Transportation (Non-Medical):   Physical Activity:   .  Days of Exercise per Week:   . Minutes of Exercise per Session:   Stress:   . Feeling of Stress :   Social Connections:   . Frequency of Communication with Friends and Family:   . Frequency of Social Gatherings with Friends and Family:   . Attends Religious Services:   . Active Member of Clubs or Organizations:   . Attends Archivist Meetings:   Marland Kitchen Marital Status:  Intimate Partner Violence:   . Fear of Current or Ex-Partner:   . Emotionally Abused:   Marland Kitchen Physically Abused:   . Sexually Abused:     Physical Exam Vitals reviewed.  Constitutional:      Appearance: Normal appearance. She is normal weight.  HENT:     Head: Normocephalic.     Nose: Nose normal.     Mouth/Throat:     Mouth: Mucous membranes are moist.     Pharynx: Oropharynx is clear.  Eyes:     Pupils: Pupils are equal, round, and reactive to light.  Cardiovascular:     Rate and Rhythm: Normal rate and regular rhythm.     Pulses: Normal pulses.  Pulmonary:     Effort: Pulmonary effort is normal.     Breath sounds: Normal breath sounds.  Abdominal:     General: Abdomen is flat.     Palpations: Abdomen is soft.  Musculoskeletal:        General: Swelling present. Normal range of motion.     Cervical back: Normal range of motion.     Right lower leg: Edema present.     Left lower leg: Edema present.  Skin:    General: Skin is warm and dry.     Capillary Refill: Capillary refill takes less than 2 seconds.  Neurological:     Mental Status: She is alert. Mental status is at baseline.  Psychiatric:        Mood and Affect: Mood normal.     Arrived for home visit for Julieth who was seated in her kitchen alert and oriented reporting her back was hurting her pretty bad today. Patient reports she just woke up and has not taken daily medications for today. I set aside daily medications for her today. Patient took same. Vitals were obtained. Patient's weight up 4 pounds from last week. Patient reports that  her weight is up due to eating a lot this weekend and not really watching her sodium intake or fluid intake. I advised the patient the importance of making sure she avoids salty foods and to decrease her fluid intake to assist with keeping the weight off. Patient agreed. Medications were reviewed and confirmed. Pill box filled accordingly. Patient was reminded of Lab visit tomorrow at clinic at 1200. Patient agreed and reports she will be there. Home visit complete. I will see patient in one week.   Refills: 800 Gabapentin     Future Appointments  Date Time Provider [place]  11/22/2019 12:00 PM [facility] LAB [facility] None  12/18/2019 11:35 AM [facility] DEVICE REMOTES [facility]  01/17/2020  7:30 AM [facility] DEVICE REMOTES [facility]  03/14/2020 11:00 AM [facility] PA/NP [facility] None  04/17/2020  7:30 AM [facility] DEVICE REMOTES [facility]  06/12/2020 10:00 AM Philemon Kingdom, MD [facility] None  06/17/2020  7:30 AM [facility] DEVICE REMOTES [facility]  07/17/2020  7:30 AM [facility] DEVICE REMOTES [facility]     ACTION: Home visit completed Next visit planned for one week

## 2019-11-21 NOTE — Progress Notes (Signed)
Remote ICD transmission.   

## 2019-11-21 NOTE — Progress Notes (Signed)
EPIC Encounter for ICM Monitoring  Patient Name: Rhonda [name] is a 69 y.o. female Date: 11/21/2019 Primary Care Physican: Maurice Small, MD Primary Cardiologist:Bensimhon Electrophysiologist: Santina Evans Pacing:100% 4/26/2021Weight: 180lbs  Battery ERI approximately 5 months   Attempted call to patient and unable to reach.  Left detailed message per DPR regarding transmission. Transmission reviewed.   Optivol thoracic impedanceclose to baseline normal.No change impedance pattern  Prescribed: Patient participating in paramedicine program to assist with meds.  Torsemide 20 mg3tablets (60 mg total)by mouthtwice a day.  Potassium  20 meq1tablet (20 mEq total)twice a day.   Metolazone 2.5mg  1 tablet by mouthas directed by CHF clinic  Labs: 06/07/2019 Creatinine 1.57, BUN 22, Potassium 3.6, Sodium 142, GFR 34-39 03/30/2019 Creatinine 1.68, BUN 24, Potassium 3.6, Sodium 139, GFR 31-36 01/11/2019 Creatinine 1.84, BUN 35, Potassium 4.2, Sodium 140, GFR 28-33 A complete set of results can be found in Results Review.  Recommendations:Left voice mail with ICM number and encouraged to call if experiencing any fluid symptoms.  Follow-up plan: ICM clinic phone appointment on8/06/2019. 91 day device clinic remote transmission8/25/2021.   Copy of ICM check sent to Dr.Taylor.  3 month ICM trend: 11/18/2019    1 Year ICM trend:       [name], RN 11/21/2019 1:03 PM

## 2019-11-22 ENCOUNTER — Ambulatory Visit (HOSPITAL_COMMUNITY)
Admission: RE | Admit: 2019-11-22 | Discharge: 2019-11-22 | Disposition: A | Discharge status: Home / Self Care | Payer: Medicare HMO | Source: Ambulatory Visit | Attending: Internal Medicine | Admitting: Internal Medicine

## 2019-11-22 ENCOUNTER — Other Ambulatory Visit: Payer: Self-pay

## 2019-11-22 DIAGNOSIS — I5032 Chronic diastolic (congestive) heart failure: Secondary | ICD-10-CM | POA: Insufficient documentation

## 2019-11-22 LAB — BASIC METABOLIC PANEL
Anion gap: 11 (ref 5–15)
BUN: 29 mg/dL — ABNORMAL HIGH (ref 8–23)
CO2: 28 mmol/L (ref 22–32)
Calcium: 9.1 mg/dL (ref 8.9–10.3)
Chloride: 101 mmol/L (ref 98–111)
Creatinine, Ser: 1.43 mg/dL — ABNORMAL HIGH (ref 0.44–1.00)
GFR calc Af Amer: 44 mL/min — ABNORMAL LOW (ref >60)
GFR calc non Af Amer: 38 mL/min — ABNORMAL LOW (ref >60)
Glucose, Bld: 116 mg/dL — ABNORMAL HIGH (ref 70–99)
Potassium: 3 mmol/L — ABNORMAL LOW (ref 3.5–5.1)
Sodium: 140 mmol/L (ref 135–145)

## 2019-11-22 LAB — BRAIN NATRIURETIC PEPTIDE: B Natriuretic Peptide: 85.1 pg/mL (ref 0.0–100.0)

## 2019-11-23 ENCOUNTER — Telehealth (HOSPITAL_COMMUNITY): Payer: Self-pay

## 2019-11-23 DIAGNOSIS — Z79891 Long term (current) use of opiate analgesic: Secondary | ICD-10-CM | POA: Diagnosis not present

## 2019-11-23 DIAGNOSIS — I2510 Atherosclerotic heart disease of native coronary artery without angina pectoris: Secondary | ICD-10-CM | POA: Diagnosis not present

## 2019-11-23 DIAGNOSIS — M4316 Spondylolisthesis, lumbar region: Secondary | ICD-10-CM | POA: Diagnosis not present

## 2019-11-23 DIAGNOSIS — Z7951 Long term (current) use of inhaled steroids: Secondary | ICD-10-CM | POA: Diagnosis not present

## 2019-11-23 DIAGNOSIS — M48061 Spinal stenosis, lumbar region without neurogenic claudication: Secondary | ICD-10-CM | POA: Diagnosis not present

## 2019-11-23 DIAGNOSIS — I5032 Chronic diastolic (congestive) heart failure: Secondary | ICD-10-CM

## 2019-11-23 DIAGNOSIS — M4856XD Collapsed vertebra, not elsewhere classified, lumbar region, subsequent encounter for fracture with routine healing: Secondary | ICD-10-CM | POA: Diagnosis not present

## 2019-11-23 DIAGNOSIS — Z9181 History of falling: Secondary | ICD-10-CM | POA: Diagnosis not present

## 2019-11-23 DIAGNOSIS — Z1231 Encounter for screening mammogram for malignant neoplasm of breast: Secondary | ICD-10-CM | POA: Diagnosis not present

## 2019-11-23 MED ORDER — SPIRONOLACTONE 25 MG PO TABS: 25.0000 mg | ORAL_TABLET | Freq: Every evening | ORAL | 3 refills | Status: DC

## 2019-11-23 NOTE — Telephone Encounter (Signed)
Patient advised and verbalized understanding. Med list updated to reflect changes. Lab order entered. Patient stated she will callback to schedule an appointment once her husband wakes up since he is her driver.   Meds ordered this encounter  Medications  . spironolactone (ALDACTONE) 25 MG tablet    Sig: Take 1 tablet (25 mg total) by mouth every evening.    Dispense:  90 tablet    Refill:  3    Please cancel all previous orders for current medication. Change in dosage or pill size.   Orders Placed This Encounter  Procedures  . Basic Metabolic Panel (BMET)    Standing Status:   Future    Standing Expiration Date:   11/22/2020    Order Specific Question:   Release to patient    Answer:   Immediate

## 2019-11-23 NOTE — Telephone Encounter (Signed)
-----   Message from Conrad [place], NP sent at 11/22/2019  3:44 PM EDT ----- Increase spiro to 25 mg daily. Continue current dose of potassium. Repeat BMET I 7 days. Please call.

## 2019-11-24 DIAGNOSIS — Z9181 History of falling: Secondary | ICD-10-CM | POA: Diagnosis not present

## 2019-11-24 DIAGNOSIS — Z79891 Long term (current) use of opiate analgesic: Secondary | ICD-10-CM | POA: Diagnosis not present

## 2019-11-24 DIAGNOSIS — M4316 Spondylolisthesis, lumbar region: Secondary | ICD-10-CM | POA: Diagnosis not present

## 2019-11-24 DIAGNOSIS — M48061 Spinal stenosis, lumbar region without neurogenic claudication: Secondary | ICD-10-CM | POA: Diagnosis not present

## 2019-11-24 DIAGNOSIS — Z7951 Long term (current) use of inhaled steroids: Secondary | ICD-10-CM | POA: Diagnosis not present

## 2019-11-24 DIAGNOSIS — I2510 Atherosclerotic heart disease of native coronary artery without angina pectoris: Secondary | ICD-10-CM | POA: Diagnosis not present

## 2019-11-24 DIAGNOSIS — M4856XD Collapsed vertebra, not elsewhere classified, lumbar region, subsequent encounter for fracture with routine healing: Secondary | ICD-10-CM | POA: Diagnosis not present

## 2019-11-28 ENCOUNTER — Other Ambulatory Visit (HOSPITAL_COMMUNITY): Payer: Self-pay

## 2019-11-28 NOTE — Progress Notes (Signed)
Paramedicine Encounter    Patient ID: Rhonda [name], female    DOB: 1950-11-28, 69 y.o.   MRN: [id number]   Patient Care Team: Maurice Small, MD as PCP - General (Family Medicine) [name], LCSW as Social Worker (Licensed Clinical Social Worker) [name], [name], DO as Consulting Physician (Neurology)  Patient Active Problem List   Diagnosis Date Noted  . Diabetes mellitus ([place]) 06/15/2019  . Hypokalemia 11/08/2018  . CKD (chronic kidney disease) stage 3, GFR 30-59 ml/min 11/08/2018  . Orthostatic hypotension 07/28/2017  . SVD (spontaneous vaginal delivery)   . Peripheral neuropathy   . On home oxygen therapy   . Migraines   . Left bundle branch block   . Hypothyroidism   . Hypertension   . Hyperlipidemia   . Heart murmur   . GERD (gastroesophageal reflux disease)   . Exertional shortness of breath   . Major depressive disorder   . Back pain   . Arthritis   . Generalized anxiety disorder   . Anemia   . Chronic respiratory failure ([place]) 09/14/2013  . 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 Morbid obesity ([place]) 11/01/2012  . Chronic systolic heart failure ([place]) 10/27/2012  . Endometrial polyp 01/20/2012  . Malignant tumor of breast ([place]) 03/26/2011  . Unspecified vitamin D deficiency 03/26/2011    Current Outpatient Medications:  .  acetaZOLAMIDE (DIAMOX) 125 MG tablet, TAKE 1 TABLET(125 MG) BY MOUTH DAILY, Disp: 90 tablet, Rfl: 3 .  carboxymethylcellulose (REFRESH PLUS) 0.5 % SOLN, Place 1 drop into both eyes 3 (three) times daily as needed (dry eyes)., Disp: , Rfl:  .  carvedilol (COREG) 3.125 MG tablet, TAKE 1 TABLET(3.125 MG) BY MOUTH TWICE DAILY WITH A MEAL, Disp: 180 tablet, Rfl: 3 .  citalopram (CELEXA) 20 MG tablet, Take 20 mg by mouth daily., Disp: , Rfl:  .  clonazePAM (KLONOPIN) 0.5 MG tablet, Take 0.5 mg by mouth 3 (three) times daily as needed for anxiety. , Disp: , Rfl:  .  DULoxetine (CYMBALTA) 30 MG capsule, Take  30 mg by mouth daily. Take once in the morning., Disp: , Rfl:  .  fexofenadine (ALLEGRA) 180 MG tablet, Take 180 mg by mouth 2 (two) times daily., Disp: , Rfl:  .  fludrocortisone (FLORINEF) 0.1 MG tablet, TAKE 1 TABLET(0.1 MG) BY MOUTH TWICE DAILY, Disp: 60 tablet, Rfl: 3 .  gabapentin (NEURONTIN) 300 MG capsule, Take 300 mg by mouth 3 (three) times daily. , Disp: , Rfl:  .  gabapentin (NEURONTIN) 800 MG tablet, Take 1 tablet (800 mg total) by mouth at bedtime as needed (neuropathy)., Disp: 20 tablet, Rfl: 0 .  HYDROcodone-acetaminophen (NORCO) 10-325 MG tablet, Take 1 tablet by mouth every 6 (six) hours as needed., Disp: , Rfl:  .  ipratropium (ATROVENT) 0.03 % nasal spray, Place 2 sprays into both nostrils 2 (two) times daily., Disp: , Rfl:  .  levothyroxine (SYNTHROID) 88 MCG tablet, TAKE 1 TABLET(88 MCG) BY MOUTH DAILY BEFORE BREAKFAST, Disp: 90 tablet, Rfl: 1 .  metolazone (ZAROXOLYN) 2.5 MG tablet, Take 1 tablet by mouth every Tuesday, Disp: 12 tablet, Rfl: 3 .  midodrine (PROAMATINE) 10 MG tablet, Take 1 tablet (10 mg total) by mouth 3 (three) times daily with meals., Disp: 270 tablet, Rfl: 3 .  mirtazapine (REMERON) 30 MG tablet, TAKE 1 TABLET BY MOUTH EVERY NIGHT AT BEDTIME, Disp: 30 tablet, Rfl: 3 .  montelukast (SINGULAIR) 10 MG tablet, Take 10 mg by mouth at bedtime. ,  Disp: , Rfl: 1 .  ondansetron (ZOFRAN) 8 MG tablet, , Disp: , Rfl:  .  potassium chloride SA (KLOR-CON) 20 MEQ tablet, Take 2 tablets (40 mEq total) by mouth 2 (two) times daily. Take an extra 8meq(2 tablets) on the days you take the metolazone., Disp: 260 tablet, Rfl: 3 .  probenecid (BENEMID) 500 MG tablet, Take 500 mg by mouth 2 (two) times daily., Disp: , Rfl:  .  simvastatin (ZOCOR) 10 MG tablet, Take 10 mg by mouth every evening. , Disp: , Rfl:  .  spironolactone (ALDACTONE) 25 MG tablet, Take 1 tablet (25 mg total) by mouth every evening., Disp: 90 tablet, Rfl: 3 .  torsemide (DEMADEX) 20 MG tablet, Take 3 tablets  (60 mg total) by mouth 2 (two) times daily., Disp: 540 tablet, Rfl: 3 Allergies  Allergen Reactions  . Ceftin Anaphylaxis    Face and throat swell   . Geodon [Ziprasidone Hcl] Hives  . Lisinopril Other (See Comments)    angioedema  . Shellfish Allergy Other (See Comments)    Gout exacerbation  . Allopurinol Nausea Only and Other (See Comments)    weakness  . Ativan [Lorazepam] Itching  . Sulfa Antibiotics Itching  . Ultram [Tramadol Hcl] Itching  . Valium [Diazepam] Other (See Comments)    Patient states that diazepam doesn't relax, it has the opposite effect.     Social History   Socioeconomic History  . Marital status: Married    Spouse name: Not on file  . Number of children: 2  . Years of education: 40  . Highest education level: Master's degree (e.g., MA, MS, MEng, MEd, MSW, MBA)  Occupational History  . Occupation: retired  Tobacco Use  . Smoking status: Former Smoker    Packs/day: 0.10    Years: 26.00    Pack years: 2.60    Types: Cigarettes    Quit date: 05/06/2007    Years since quitting: 12.5  . Smokeless tobacco: Never Used  Vaping Use  . Vaping Use: Never used  Substance and Sexual Activity  . Alcohol use: No  . Drug use: No  . Sexual activity: Yes  Other Topics Concern  . 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 Difficulty of Paying Living Expenses:   Food Insecurity:   . Worried About Charity fundraiser in the Last Year:   . Arboriculturist in the Last Year:   Transportation Needs:   . Film/video editor (Medical):   Marland Kitchen Lack of Transportation (Non-Medical):    Physical Activity:   . Days of Exercise per Week:   . Minutes of Exercise per Session:   Stress:   . Feeling of Stress :   Social Connections:   . Frequency of Communication with Friends and Family:   . Frequency of Social Gatherings with Friends and Family:   . Attends Religious Services:   . Active Member of Clubs or Organizations:   . Attends Archivist Meetings:   Marland Kitchen Marital  Status:   Intimate Partner Violence:   . Fear of Current or Ex-Partner:   . Emotionally Abused:   Marland Kitchen Physically Abused:   . Sexually Abused:     Physical Exam Vitals reviewed.  Constitutional:      Appearance: She is normal weight.  HENT:     Head: Normocephalic.     Nose: Nose normal.     Mouth/Throat:     Mouth: Mucous membranes are moist.  Eyes:     Pupils: Pupils are equal, round, and reactive to light.  Cardiovascular:     Rate and Rhythm: Normal rate and regular rhythm.     Pulses: Normal pulses.  Pulmonary:     Effort: Pulmonary effort is normal.     Breath sounds: Normal breath sounds.  Abdominal:     Palpations: Abdomen is soft.  Musculoskeletal:        General: Swelling present. Normal range of motion.     Cervical back: Normal range of motion.     Right lower leg: Edema present.     Left lower leg: No edema.  Skin:    General: Skin is warm and dry.     Capillary Refill: Capillary refill takes less than 2 seconds.  Neurological:     Mental Status: She is alert. Mental status is at baseline.  Psychiatric:        Mood and Affect: Mood normal.     Arrived for home visit for [facility] where she was alert and oriented with no complaints on assessment. Vitals obtained. Weight up 3 pounds from last week. Reports she has been drinking a lot of water because she is so thirsty. I informed her the need to decrease her fluid intake the best she can. Medications were reviewed and confirmed. 2 Pill Boxes were filled due to my absence next week. Jisselle was instructed on how to switch.  She understood. Carmeline was scheduled for labs for tomorrow at 1:45. I will make changes as needed. Home visit complete.   Refills:  -Gabapentin 800mg  -Clonazepam -Midodrine       Future Appointments  Date Time Provider [place]  12/18/2019 11:35 AM [facility] DEVICE REMOTES [facility]  12/25/2019 11:10 AM [facility] DEVICE REMOTES [facility]  01/17/2020  7:30 AM [facility] DEVICE REMOTES [facility]  02/29/2020  8:50 AM [name], Donika K, DO [facility] None  03/14/2020 11:00 AM [facility] PA/NP [facility] None  04/17/2020  7:30 AM [facility] DEVICE REMOTES [facility]  06/12/2020 10:00 AM Philemon Kingdom, MD [facility] None  06/17/2020  7:30 AM [facility] DEVICE REMOTES [facility]  07/17/2020  7:30 AM [facility] DEVICE REMOTES [facility]     ACTION: Home visit completed Next visit planned for two weeks

## 2019-11-29 ENCOUNTER — Ambulatory Visit (HOSPITAL_COMMUNITY)
Admission: RE | Admit: 2019-11-29 | Discharge: 2019-11-29 | Disposition: A | Discharge status: Home / Self Care | Payer: Medicare HMO | Source: Ambulatory Visit | Attending: Internal Medicine | Admitting: Internal Medicine

## 2019-11-29 ENCOUNTER — Telehealth (HOSPITAL_COMMUNITY): Payer: Self-pay

## 2019-11-29 ENCOUNTER — Other Ambulatory Visit: Payer: Self-pay

## 2019-11-29 DIAGNOSIS — M4316 Spondylolisthesis, lumbar region: Secondary | ICD-10-CM | POA: Diagnosis not present

## 2019-11-29 DIAGNOSIS — I5032 Chronic diastolic (congestive) heart failure: Secondary | ICD-10-CM | POA: Diagnosis not present

## 2019-11-29 DIAGNOSIS — Z79891 Long term (current) use of opiate analgesic: Secondary | ICD-10-CM | POA: Diagnosis not present

## 2019-11-29 DIAGNOSIS — Z7951 Long term (current) use of inhaled steroids: Secondary | ICD-10-CM | POA: Diagnosis not present

## 2019-11-29 DIAGNOSIS — M48061 Spinal stenosis, lumbar region without neurogenic claudication: Secondary | ICD-10-CM | POA: Diagnosis not present

## 2019-11-29 DIAGNOSIS — I2510 Atherosclerotic heart disease of native coronary artery without angina pectoris: Secondary | ICD-10-CM | POA: Diagnosis not present

## 2019-11-29 DIAGNOSIS — Z9181 History of falling: Secondary | ICD-10-CM | POA: Diagnosis not present

## 2019-11-29 DIAGNOSIS — M4856XD Collapsed vertebra, not elsewhere classified, lumbar region, subsequent encounter for fracture with routine healing: Secondary | ICD-10-CM | POA: Diagnosis not present

## 2019-11-29 LAB — BASIC METABOLIC PANEL
Anion gap: 13 (ref 5–15)
BUN: 31 mg/dL — ABNORMAL HIGH (ref 8–23)
CO2: 26 mmol/L (ref 22–32)
Calcium: 8.9 mg/dL (ref 8.9–10.3)
Chloride: 102 mmol/L (ref 98–111)
Creatinine, Ser: 1.41 mg/dL — ABNORMAL HIGH (ref 0.44–1.00)
GFR calc Af Amer: 45 mL/min — ABNORMAL LOW (ref >60)
GFR calc non Af Amer: 38 mL/min — ABNORMAL LOW (ref >60)
Glucose, Bld: 115 mg/dL — ABNORMAL HIGH (ref 70–99)
Potassium: 3.1 mmol/L — ABNORMAL LOW (ref 3.5–5.1)
Sodium: 141 mmol/L (ref 135–145)

## 2019-11-29 NOTE — Telephone Encounter (Signed)
Left message for Ed to place extra potassiums in pill box for Rhonda [name]. Will asssit if needed.   10mEq, three times daily.

## 2019-11-30 ENCOUNTER — Other Ambulatory Visit (HOSPITAL_COMMUNITY): Payer: Self-pay

## 2019-11-30 ENCOUNTER — Telehealth (HOSPITAL_COMMUNITY): Payer: Self-pay | Admitting: Cardiology

## 2019-11-30 MED ORDER — POTASSIUM CHLORIDE CRYS ER 20 MEQ PO TBCR: 40.0000 meq | EXTENDED_RELEASE_TABLET | Freq: Three times a day (TID) | ORAL | 3 refills | Status: DC

## 2019-11-30 NOTE — Telephone Encounter (Signed)
Pt aware and voiced understanding [name] with [facility] aware as well, will assist to ensure med changes are carried out

## 2019-11-30 NOTE — Progress Notes (Signed)
Med changes corrected by husband.    ACTION: Home visit completed Next visit planned for two weeks

## 2019-11-30 NOTE — Telephone Encounter (Signed)
-----   Message from Conrad Shonto, NP sent at 11/29/2019  4:27 PM EDT ----- Potassium low. Increase potassium to 40 meq three times a day.

## 2019-12-01 ENCOUNTER — Other Ambulatory Visit (HOSPITAL_COMMUNITY): Payer: Self-pay | Admitting: *Deleted

## 2019-12-01 DIAGNOSIS — M4716 Other spondylosis with myelopathy, lumbar region: Secondary | ICD-10-CM | POA: Diagnosis not present

## 2019-12-06 DIAGNOSIS — M4316 Spondylolisthesis, lumbar region: Secondary | ICD-10-CM | POA: Diagnosis not present

## 2019-12-06 DIAGNOSIS — M48061 Spinal stenosis, lumbar region without neurogenic claudication: Secondary | ICD-10-CM | POA: Diagnosis not present

## 2019-12-06 DIAGNOSIS — Z7951 Long term (current) use of inhaled steroids: Secondary | ICD-10-CM | POA: Diagnosis not present

## 2019-12-06 DIAGNOSIS — I2510 Atherosclerotic heart disease of native coronary artery without angina pectoris: Secondary | ICD-10-CM | POA: Diagnosis not present

## 2019-12-06 DIAGNOSIS — Z79891 Long term (current) use of opiate analgesic: Secondary | ICD-10-CM | POA: Diagnosis not present

## 2019-12-06 DIAGNOSIS — Z9181 History of falling: Secondary | ICD-10-CM | POA: Diagnosis not present

## 2019-12-06 DIAGNOSIS — M4856XD Collapsed vertebra, not elsewhere classified, lumbar region, subsequent encounter for fracture with routine healing: Secondary | ICD-10-CM | POA: Diagnosis not present

## 2019-12-07 ENCOUNTER — Other Ambulatory Visit: Payer: Self-pay

## 2019-12-07 MED ORDER — GABAPENTIN 800 MG PO TABS: 800.0000 mg | ORAL_TABLET | Freq: Every evening | ORAL | 1 refills | Status: DC | PRN

## 2019-12-11 DIAGNOSIS — M48061 Spinal stenosis, lumbar region without neurogenic claudication: Secondary | ICD-10-CM | POA: Diagnosis not present

## 2019-12-12 ENCOUNTER — Other Ambulatory Visit (HOSPITAL_COMMUNITY): Payer: Self-pay

## 2019-12-12 NOTE — Progress Notes (Signed)
Paramedicine Encounter    Patient ID: Rhonda [name], female    DOB: 07-Dec-1950, 69 y.o.   MRN: [id number]   Patient Care Team: Maurice Small, MD as PCP - General (Family Medicine) [name], LCSW as Social Worker (Licensed Clinical Social Worker) [name], [name], DO as Consulting Physician (Neurology)  Patient Active Problem List   Diagnosis Date Noted  . Diabetes mellitus ([place]) 06/15/2019  . Hypokalemia 11/08/2018  . CKD (chronic kidney disease) stage 3, GFR 30-59 ml/min 11/08/2018  . Orthostatic hypotension 07/28/2017  . SVD (spontaneous vaginal delivery)   . Peripheral neuropathy   . On home oxygen therapy   . Migraines   . Left bundle branch block   . Hypothyroidism   . Hypertension   . Hyperlipidemia   . Heart murmur   . GERD (gastroesophageal reflux disease)   . Exertional shortness of breath   . Major depressive disorder   . Back pain   . Arthritis   . Generalized anxiety disorder   . Anemia   . Chronic respiratory failure ([place]) 09/14/2013  . 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 Morbid obesity ([place]) 11/01/2012  . Chronic systolic heart failure ([place]) 10/27/2012  . Endometrial polyp 01/20/2012  . Malignant tumor of breast ([place]) 03/26/2011  . Unspecified vitamin D deficiency 03/26/2011    Current Outpatient Medications:  .  acetaZOLAMIDE (DIAMOX) 125 MG tablet, TAKE 1 TABLET(125 MG) BY MOUTH DAILY, Disp: 90 tablet, Rfl: 3 .  carboxymethylcellulose (REFRESH PLUS) 0.5 % SOLN, Place 1 drop into both eyes 3 (three) times daily as needed (dry eyes)., Disp: , Rfl:  .  carvedilol (COREG) 3.125 MG tablet, TAKE 1 TABLET(3.125 MG) BY MOUTH TWICE DAILY WITH A MEAL, Disp: 180 tablet, Rfl: 3 .  citalopram (CELEXA) 20 MG tablet, Take 20 mg by mouth daily., Disp: , Rfl:  .  clonazePAM (KLONOPIN) 0.5 MG tablet, Take 0.5 mg by mouth 3 (three) times daily as needed for anxiety. , Disp: , Rfl:  .  DULoxetine (CYMBALTA) 30 MG capsule, Take  30 mg by mouth daily. Take once in the morning., Disp: , Rfl:  .  fexofenadine (ALLEGRA) 180 MG tablet, Take 180 mg by mouth 2 (two) times daily., Disp: , Rfl:  .  fludrocortisone (FLORINEF) 0.1 MG tablet, TAKE 1 TABLET(0.1 MG) BY MOUTH TWICE DAILY, Disp: 60 tablet, Rfl: 3 .  gabapentin (NEURONTIN) 300 MG capsule, Take 300 mg by mouth 3 (three) times daily. , Disp: , Rfl:  .  gabapentin (NEURONTIN) 800 MG tablet, Take 1 tablet (800 mg total) by mouth at bedtime as needed (neuropathy)., Disp: 90 tablet, Rfl: 1 .  HYDROcodone-acetaminophen (NORCO) 10-325 MG tablet, Take 1 tablet by mouth every 6 (six) hours as needed., Disp: , Rfl:  .  ipratropium (ATROVENT) 0.03 % nasal spray, Place 2 sprays into both nostrils 2 (two) times daily., Disp: , Rfl:  .  levothyroxine (SYNTHROID) 88 MCG tablet, TAKE 1 TABLET(88 MCG) BY MOUTH DAILY BEFORE BREAKFAST, Disp: 90 tablet, Rfl: 1 .  metolazone (ZAROXOLYN) 2.5 MG tablet, Take 1 tablet by mouth every Tuesday, Disp: 12 tablet, Rfl: 3 .  midodrine (PROAMATINE) 10 MG tablet, Take 1 tablet (10 mg total) by mouth 3 (three) times daily with meals., Disp: 270 tablet, Rfl: 3 .  mirtazapine (REMERON) 30 MG tablet, TAKE 1 TABLET BY MOUTH EVERY NIGHT AT BEDTIME, Disp: 30 tablet, Rfl: 3 .  montelukast (SINGULAIR) 10 MG tablet, Take 10 mg by mouth at bedtime. ,  Disp: , Rfl: 1 .  ondansetron (ZOFRAN) 8 MG tablet, , Disp: , Rfl:  .  potassium chloride SA (KLOR-CON) 20 MEQ tablet, Take 2 tablets (40 mEq total) by mouth 3 (three) times daily. Take an extra 48meq(2 tablets) on the days you take the metolazone., Disp: 300 tablet, Rfl: 3 .  probenecid (BENEMID) 500 MG tablet, Take 500 mg by mouth 2 (two) times daily., Disp: , Rfl:  .  simvastatin (ZOCOR) 10 MG tablet, Take 10 mg by mouth every evening. , Disp: , Rfl:  .  spironolactone (ALDACTONE) 25 MG tablet, Take 1 tablet (25 mg total) by mouth every evening., Disp: 90 tablet, Rfl: 3 .  torsemide (DEMADEX) 20 MG tablet, Take 3  tablets (60 mg total) by mouth 2 (two) times daily., Disp: 540 tablet, Rfl: 3 Allergies  Allergen Reactions  . Ceftin Anaphylaxis    Face and throat swell   . Geodon [Ziprasidone Hcl] Hives  . Lisinopril Other (See Comments)    angioedema  . Shellfish Allergy Other (See Comments)    Gout exacerbation  . Allopurinol Nausea Only and Other (See Comments)    weakness  . Ativan [Lorazepam] Itching  . Sulfa Antibiotics Itching  . Ultram [Tramadol Hcl] Itching  . Valium [Diazepam] Other (See Comments)    Patient states that diazepam doesn't relax, it has the opposite effect.     Social History   Socioeconomic History  . Marital status: Married    Spouse name: Not on file  . Number of children: 2  . Years of education: 24  . Highest education level: Master's degree (e.g., MA, MS, MEng, MEd, MSW, MBA)  Occupational History  . Occupation: retired  Tobacco Use  . Smoking status: Former Smoker    Packs/day: 0.10    Years: 26.00    Pack years: 2.60    Types: Cigarettes    Quit date: 05/06/2007    Years since quitting: 12.6  . Smokeless tobacco: Never Used  Vaping Use  . Vaping Use: Never used  Substance and Sexual Activity  . Alcohol use: No  . Drug use: No  . Sexual activity: Yes  Other Topics Concern  . 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 Difficulty of Paying Living Expenses:   Food Insecurity:   . Worried About Charity fundraiser in the Last Year:   . Arboriculturist in the Last Year:   Transportation Needs:   . Film/video editor (Medical):   Marland Kitchen Lack of Transportation (Non-Medical):    Physical Activity:   . Days of Exercise per Week:   . Minutes of Exercise per Session:   Stress:   . Feeling of Stress :   Social Connections:   . Frequency of Communication with Friends and Family:   . Frequency of Social Gatherings with Friends and Family:   . Attends Religious Services:   . Active Member of Clubs or Organizations:   . Attends Archivist Meetings:   Marland Kitchen Marital  Status:   Intimate Partner Violence:   . Fear of Current or Ex-Partner:   . Emotionally Abused:   Marland Kitchen Physically Abused:   . Sexually Abused:     Physical Exam Vitals reviewed.  HENT:     Head: Normocephalic.     Nose: Nose normal.     Mouth/Throat:     Mouth: Mucous membranes are moist.  Eyes:     Pupils: Pupils are equal, round, and reactive to light.  Cardiovascular:     Rate and Rhythm: Normal rate and regular rhythm.     Pulses: Normal pulses.     Heart sounds: Normal heart sounds.  Pulmonary:     Effort: Pulmonary effort is normal.     Breath sounds: Normal breath sounds.  Abdominal:     General: Abdomen is flat.     Palpations: Abdomen is soft.  Musculoskeletal:        General: Normal range of motion.     Cervical back: Normal range of motion.     Right lower leg: Edema present.     Left lower leg: Edema present.  Skin:    General: Skin is warm and dry.     Capillary Refill: Capillary refill takes less than 2 seconds.  Neurological:     Mental Status: She is alert. Mental status is at baseline.  Psychiatric:        Mood and Affect: Mood normal.       Arrived for home visit for Mrs. Bossler who was ambulating around her home alert and oriented without shortness of breath. Mrs. Chieffo reports she had a fall and was over night a few days ago and was seen by a doctor and evaluated and medically cleared. Mrs. Blakeman had a hematoma to the left temple area, Mrs. Brundidge reports having some mild back pain agitated by her fall but no other injuries or complaints. Vitals obtained and as  noted. Medications reviewed and confirmed. Pill box filled accordingly. Clonazepam was ordered last week and should be delivered soon, missing same in NOON all week and SAT, SUN, MON EVE spots in pill box. Patient made aware and note left to remind patient to place same in box. Mrs. Kerwood was given dietary education and she agreed with same advice. Home visit complete. I will see patient in one week.     Future Appointments  Date Time Provider [place]  12/18/2019 11:35 AM [facility] DEVICE REMOTES [facility]  12/25/2019 11:10 AM [facility] DEVICE REMOTES [facility]  01/17/2020  7:30 AM [facility] DEVICE REMOTES [facility]  02/29/2020  8:50 AM Narda Amber K, DO [facility] None  03/14/2020 11:00 AM [facility] PA/NP [facility] None  04/17/2020  7:30 AM [facility] DEVICE REMOTES [facility]  06/12/2020 10:00 AM Philemon Kingdom, MD [facility] None  06/17/2020  7:30 AM [facility] DEVICE REMOTES [facility]  07/17/2020  7:30 AM [facility] DEVICE REMOTES [facility]     ACTION: Home visit completed Next visit planned for one week

## 2019-12-14 ENCOUNTER — Other Ambulatory Visit: Payer: Self-pay | Admitting: Internal Medicine

## 2019-12-18 ENCOUNTER — Ambulatory Visit (INDEPENDENT_AMBULATORY_CARE_PROVIDER_SITE_OTHER): Payer: Medicare HMO | Admitting: *Deleted

## 2019-12-18 DIAGNOSIS — I5022 Chronic systolic (congestive) heart failure: Secondary | ICD-10-CM

## 2019-12-18 DIAGNOSIS — I447 Left bundle-branch block, unspecified: Secondary | ICD-10-CM

## 2019-12-19 ENCOUNTER — Other Ambulatory Visit (HOSPITAL_COMMUNITY): Payer: Self-pay

## 2019-12-19 NOTE — Progress Notes (Signed)
Paramedicine Encounter    Patient ID: Rhonda [name], female    DOB: 03/09/1951, 69 y.o.   MRN: [id number]   Patient Care Team: Maurice Small, MD as PCP - General (Family Medicine) [name], LCSW as Social Worker (Licensed Clinical Social Worker) [name], [name], DO as Consulting Physician (Neurology)  Patient Active Problem List   Diagnosis Date Noted  . Diabetes mellitus ([place]) 06/15/2019  . Hypokalemia 11/08/2018  . CKD (chronic kidney disease) stage 3, GFR 30-59 ml/min 11/08/2018  . Orthostatic hypotension 07/28/2017  . SVD (spontaneous vaginal delivery)   . Peripheral neuropathy   . On home oxygen therapy   . Migraines   . Left bundle branch block   . Hypothyroidism   . Hypertension   . Hyperlipidemia   . Heart murmur   . GERD (gastroesophageal reflux disease)   . Exertional shortness of breath   . Major depressive disorder   . Back pain   . Arthritis   . Generalized anxiety disorder   . Anemia   . Chronic respiratory failure ([place]) 09/14/2013  . 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 Morbid obesity ([place]) 11/01/2012  . Chronic systolic heart failure ([place]) 10/27/2012  . Endometrial polyp 01/20/2012  . Malignant tumor of breast ([place]) 03/26/2011  . Unspecified vitamin D deficiency 03/26/2011    Current Outpatient Medications:  .  acetaZOLAMIDE (DIAMOX) 125 MG tablet, TAKE 1 TABLET(125 MG) BY MOUTH DAILY, Disp: 90 tablet, Rfl: 3 .  carboxymethylcellulose (REFRESH PLUS) 0.5 % SOLN, Place 1 drop into both eyes 3 (three) times daily as needed (dry eyes)., Disp: , Rfl:  .  carvedilol (COREG) 3.125 MG tablet, TAKE 1 TABLET(3.125 MG) BY MOUTH TWICE DAILY WITH A MEAL, Disp: 180 tablet, Rfl: 3 .  citalopram (CELEXA) 20 MG tablet, Take 20 mg by mouth daily., Disp: , Rfl:  .  clonazePAM (KLONOPIN) 0.5 MG tablet, Take 0.5 mg by mouth 3 (three) times daily as needed for anxiety. , Disp: , Rfl:  .  DULoxetine (CYMBALTA) 30 MG capsule, Take  30 mg by mouth daily. Take once in the morning., Disp: , Rfl:  .  fexofenadine (ALLEGRA) 180 MG tablet, Take 180 mg by mouth 2 (two) times daily., Disp: , Rfl:  .  fludrocortisone (FLORINEF) 0.1 MG tablet, TAKE 1 TABLET(0.1 MG) BY MOUTH TWICE DAILY, Disp: 60 tablet, Rfl: 3 .  gabapentin (NEURONTIN) 300 MG capsule, Take 300 mg by mouth 3 (three) times daily. , Disp: , Rfl:  .  gabapentin (NEURONTIN) 800 MG tablet, Take 1 tablet (800 mg total) by mouth at bedtime as needed (neuropathy)., Disp: 90 tablet, Rfl: 1 .  HYDROcodone-acetaminophen (NORCO) 10-325 MG tablet, Take 1 tablet by mouth every 6 (six) hours as needed., Disp: , Rfl:  .  ipratropium (ATROVENT) 0.03 % nasal spray, Place 2 sprays into both nostrils 2 (two) times daily., Disp: , Rfl:  .  levothyroxine (SYNTHROID) 88 MCG tablet, TAKE 1 TABLET(88 MCG) BY MOUTH DAILY BEFORE BREAKFAST, Disp: 45 tablet, Rfl: 2 .  metolazone (ZAROXOLYN) 2.5 MG tablet, Take 1 tablet by mouth every Tuesday, Disp: 12 tablet, Rfl: 3 .  midodrine (PROAMATINE) 10 MG tablet, Take 1 tablet (10 mg total) by mouth 3 (three) times daily with meals., Disp: 270 tablet, Rfl: 3 .  mirtazapine (REMERON) 30 MG tablet, TAKE 1 TABLET BY MOUTH EVERY NIGHT AT BEDTIME, Disp: 30 tablet, Rfl: 3 .  montelukast (SINGULAIR) 10 MG tablet, Take 10 mg by mouth at bedtime. ,  Disp: , Rfl: 1 .  ondansetron (ZOFRAN) 8 MG tablet, , Disp: , Rfl:  .  potassium chloride SA (KLOR-CON) 20 MEQ tablet, Take 2 tablets (40 mEq total) by mouth 3 (three) times daily. Take an extra 48meq(2 tablets) on the days you take the metolazone., Disp: 300 tablet, Rfl: 3 .  probenecid (BENEMID) 500 MG tablet, Take 500 mg by mouth 2 (two) times daily., Disp: , Rfl:  .  simvastatin (ZOCOR) 10 MG tablet, Take 10 mg by mouth every evening. , Disp: , Rfl:  .  spironolactone (ALDACTONE) 25 MG tablet, Take 1 tablet (25 mg total) by mouth every evening., Disp: 90 tablet, Rfl: 3 .  torsemide (DEMADEX) 20 MG tablet, Take 3  tablets (60 mg total) by mouth 2 (two) times daily., Disp: 540 tablet, Rfl: 3 Allergies  Allergen Reactions  . Ceftin Anaphylaxis    Face and throat swell   . Geodon [Ziprasidone Hcl] Hives  . Lisinopril Other (See Comments)    angioedema  . Shellfish Allergy Other (See Comments)    Gout exacerbation  . Allopurinol Nausea Only and Other (See Comments)    weakness  . Ativan [Lorazepam] Itching  . Sulfa Antibiotics Itching  . Ultram [Tramadol Hcl] Itching  . Valium [Diazepam] Other (See Comments)    Patient states that diazepam doesn't relax, it has the opposite effect.     Social History   Socioeconomic History  . Marital status: Married    Spouse name: Not on file  . Number of children: 2  . Years of education: 24  . Highest education level: Master's degree (e.g., MA, MS, MEng, MEd, MSW, MBA)  Occupational History  . Occupation: retired  Tobacco Use  . Smoking status: Former Smoker    Packs/day: 0.10    Years: 26.00    Pack years: 2.60    Types: Cigarettes    Quit date: 05/06/2007    Years since quitting: 12.6  . Smokeless tobacco: Never Used  Vaping Use  . Vaping Use: Never used  Substance and Sexual Activity  . Alcohol use: No  . Drug use: No  . Sexual activity: Yes  Other Topics Concern  . 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 Difficulty of Paying Living Expenses:   Food Insecurity:   . Worried About Charity fundraiser in the Last Year:   . Arboriculturist in the Last Year:   Transportation Needs:   . Film/video editor (Medical):   Marland Kitchen Lack of Transportation (Non-Medical):    Physical Activity:   . Days of Exercise per Week:   . Minutes of Exercise per Session:   Stress:   . Feeling of Stress :   Social Connections:   . Frequency of Communication with Friends and Family:   . Frequency of Social Gatherings with Friends and Family:   . Attends Religious Services:   . Active Member of Clubs or Organizations:   . Attends Archivist Meetings:   Marland Kitchen Marital  Status:   Intimate Partner Violence:   . Fear of Current or Ex-Partner:   . Emotionally Abused:   Marland Kitchen Physically Abused:   . Sexually Abused:     Physical Exam Vitals reviewed.  Constitutional:      Appearance: She is normal weight.  HENT:     Head: Normocephalic.     Nose: Nose normal.     Mouth/Throat:     Mouth: Mucous membranes are moist.     Pharynx: Oropharynx is clear.  Eyes:     Pupils: Pupils are equal, round, and reactive to light.  Cardiovascular:     Rate and Rhythm: Normal rate and regular rhythm.     Pulses: Normal pulses.     Heart sounds: Normal heart sounds.  Pulmonary:     Effort: Pulmonary effort is normal.     Breath sounds: Normal breath sounds.  Abdominal:     Palpations: Abdomen is soft.  Musculoskeletal:        General: Swelling present. Normal range of motion.     Cervical back: Normal range of motion.     Right lower leg: Edema present.     Left lower leg: Edema present.  Skin:    General: Skin is warm and dry.     Capillary Refill: Capillary refill takes less than 2 seconds.  Neurological:     Mental Status: She is alert. Mental status is at baseline.  Psychiatric:        Mood and Affect: Mood normal.     Arrived for home visit for Mrs. Roggenkamp who was alert and oriented with no complaints. Mrs. Paige has been compliant with medications for one week. Medications reviewed and confirmed. Pill box filled accordingly. Mrs. Mount's vitals obtained. Assessment as noted. Lung sounds clear, no JVD, leg swelling present with mild edema. Mrs. Kever reports she has  decided to get her spinal surgery but wants to make sure HF clinic agreed with same. I will pass message along to them for approval. Mrs. Carlino agreed to visit in one week. Upcoming appointments given to patient. Visit complete.    Refills:  Allegra Clonazepam     Future Appointments  Date Time Provider [place]  12/25/2019 11:10 AM [facility] DEVICE REMOTES [facility]  12/28/2019  3:15 PM Landis Martins, DPM [facility]  01/17/2020  7:30 AM [facility] DEVICE REMOTES [facility]  02/29/2020  8:50 AM Narda Amber K, DO [facility] None  03/14/2020 11:00 AM [facility] PA/NP [facility] None  04/17/2020  7:30 AM [facility] DEVICE REMOTES [facility]  06/12/2020 10:00 AM Philemon Kingdom, MD [facility] None  06/17/2020  7:30 AM [facility] DEVICE REMOTES [facility]  07/17/2020  7:30 AM [facility] DEVICE REMOTES [facility]     ACTION: Home visit completed Next visit planned for one week

## 2019-12-20 LAB — CUP PACEART REMOTE DEVICE CHECK
Battery Remaining Longevity: 5 mo
Battery Voltage: 2.78 V
Brady Statistic AP VP Percent: 0.28 %
Brady Statistic AP VS Percent: 0.01 %
Brady Statistic AS VP Percent: 99.69 %
Brady Statistic AS VS Percent: 0.02 %
Brady Statistic RA Percent Paced: 0.29 %
Brady Statistic RV Percent Paced: 99.95 %
Date Time Interrogation Session: 20210727202727
HighPow Impedance: 44 Ohm
Implantable Lead Implant Date: 20141210
Implantable Lead Implant Date: 20141210
Implantable Lead Implant Date: 20141210
Implantable Lead Location: 753858
Implantable Lead Location: 753859
Implantable Lead Location: 753860
Implantable Lead Model: 4396
Implantable Lead Model: 5076
Implantable Lead Model: 6935
Implantable Pulse Generator Implant Date: 20141210
Lead Channel Impedance Value: 342 Ohm
Lead Channel Impedance Value: 380 Ohm
Lead Channel Impedance Value: 437 Ohm
Lead Channel Impedance Value: 494 Ohm
Lead Channel Impedance Value: 570 Ohm
Lead Channel Impedance Value: 589 Ohm
Lead Channel Pacing Threshold Amplitude: 0.625 V
Lead Channel Pacing Threshold Amplitude: 1 V
Lead Channel Pacing Threshold Amplitude: 1.125 V
Lead Channel Pacing Threshold Pulse Width: 0.4 ms
Lead Channel Pacing Threshold Pulse Width: 0.4 ms
Lead Channel Pacing Threshold Pulse Width: 0.4 ms
Lead Channel Sensing Intrinsic Amplitude: 18.125 mV
Lead Channel Sensing Intrinsic Amplitude: 18.125 mV
Lead Channel Sensing Intrinsic Amplitude: 2.75 mV
Lead Channel Sensing Intrinsic Amplitude: 2.75 mV
Lead Channel Setting Pacing Amplitude: 2 V
Lead Channel Setting Pacing Amplitude: 2.25 V
Lead Channel Setting Pacing Amplitude: 2.5 V
Lead Channel Setting Pacing Pulse Width: 0.4 ms
Lead Channel Setting Pacing Pulse Width: 0.4 ms
Lead Channel Setting Sensing Sensitivity: 0.3 mV

## 2019-12-20 NOTE — Progress Notes (Signed)
Remote ICD transmission.   

## 2019-12-20 NOTE — Addendum Note (Signed)
Addended by: Douglass Rivers D on: 12/20/2019 01:01 PM   Modules accepted: Level of Service

## 2019-12-21 ENCOUNTER — Ambulatory Visit (INDEPENDENT_AMBULATORY_CARE_PROVIDER_SITE_OTHER): Payer: Medicare HMO

## 2019-12-21 DIAGNOSIS — Z95810 Presence of automatic (implantable) cardiac defibrillator: Secondary | ICD-10-CM

## 2019-12-21 DIAGNOSIS — I5022 Chronic systolic (congestive) heart failure: Secondary | ICD-10-CM | POA: Diagnosis not present

## 2019-12-22 NOTE — Progress Notes (Signed)
EPIC Encounter for ICM Monitoring  Patient Name: Rhonda [name] is a 69 y.o. female Date: 12/22/2019 Primary Care Physican: Maurice Small, MD Primary Cardiologist:Bensimhon Electrophysiologist: Santina Evans Pacing:99.9% 6/16/2021Office Weight: 204lbs  Battery ERI approximately95months   Transmission reviewed.   Optivol thoracic impedancenormal.  Prescribed: Patient participating in paramedicine program to assist with meds.  Torsemide 20 mg3tablets (60 mg total)by mouthtwice a day.  Potassium  20 meq2tablets (40 mEq total) by mouth 3 (three) times daily. Take extra 40 mEq on days you take Metolazone.  Metolazone 2.5mg  1 tablet every Tuesday.  Spironolactone 25 mg take 1 tablet by mouth every evening.  Labs: 11/29/2019 Creatinine 1.41, BUN 31, Potassium 3.1, Sodium 141, GFR 38-45 11/22/2019 Creatinine 1.43, BUN 29, Potassium 3.0, Sodium 140, GFR 38-44  A complete set of results can be found in Results Review.  Recommendations:No changes  Follow-up plan: ICM clinic phone appointment on9/05/2019. 91 day device clinic remote transmission8/25/2021.   EP/Cardiology Office Visits: 03/14/2020 with [facility] PA/NP.    Copy of ICM check sent to Dr. Lovena Le.   3 month ICM trend: 12/19/2019    1 Year ICM trend:       [name], RN 12/22/2019 5:01 PM

## 2019-12-26 ENCOUNTER — Other Ambulatory Visit (HOSPITAL_COMMUNITY): Payer: Self-pay

## 2019-12-26 NOTE — Progress Notes (Signed)
Paramedicine Encounter    Patient ID: Rhonda [name], female    DOB: 1950/07/22, 69 y.o.   MRN: [id number]   Patient Care Team: Maurice Small, MD as PCP - General (Family Medicine) [name], LCSW as Social Worker (Licensed Clinical Social Worker) [name], [name], DO as Consulting Physician (Neurology)  Patient Active Problem List   Diagnosis Date Noted  . Diabetes mellitus ([place]) 06/15/2019  . Hypokalemia 11/08/2018  . CKD (chronic kidney disease) stage 3, GFR 30-59 ml/min 11/08/2018  . Orthostatic hypotension 07/28/2017  . SVD (spontaneous vaginal delivery)   . Peripheral neuropathy   . On home oxygen therapy   . Migraines   . Left bundle branch block   . Hypothyroidism   . Hypertension   . Hyperlipidemia   . Heart murmur   . GERD (gastroesophageal reflux disease)   . Exertional shortness of breath   . Major depressive disorder   . Back pain   . Arthritis   . Generalized anxiety disorder   . Anemia   . Chronic respiratory failure ([place]) 09/14/2013  . 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 Morbid obesity ([place]) 11/01/2012  . Chronic systolic heart failure ([place]) 10/27/2012  . Endometrial polyp 01/20/2012  . Malignant tumor of breast ([place]) 03/26/2011  . Unspecified vitamin D deficiency 03/26/2011    Current Outpatient Medications:  .  acetaZOLAMIDE (DIAMOX) 125 MG tablet, TAKE 1 TABLET(125 MG) BY MOUTH DAILY, Disp: 90 tablet, Rfl: 3 .  carboxymethylcellulose (REFRESH PLUS) 0.5 % SOLN, Place 1 drop into both eyes 3 (three) times daily as needed (dry eyes)., Disp: , Rfl:  .  carvedilol (COREG) 3.125 MG tablet, TAKE 1 TABLET(3.125 MG) BY MOUTH TWICE DAILY WITH A MEAL, Disp: 180 tablet, Rfl: 3 .  citalopram (CELEXA) 20 MG tablet, Take 20 mg by mouth daily., Disp: , Rfl:  .  clonazePAM (KLONOPIN) 0.5 MG tablet, Take 0.5 mg by mouth 3 (three) times daily as needed for anxiety.  (Patient not taking: Reported on 12/19/2019), Disp: , Rfl:   .  DULoxetine (CYMBALTA) 30 MG capsule, Take 30 mg by mouth daily. Take once in the morning., Disp: , Rfl:  .  fexofenadine (ALLEGRA) 180 MG tablet, Take 180 mg by mouth 2 (two) times daily. (Patient not taking: Reported on 12/19/2019), Disp: , Rfl:  .  fludrocortisone (FLORINEF) 0.1 MG tablet, TAKE 1 TABLET(0.1 MG) BY MOUTH TWICE DAILY, Disp: 60 tablet, Rfl: 3 .  gabapentin (NEURONTIN) 300 MG capsule, Take 300 mg by mouth 3 (three) times daily. , Disp: , Rfl:  .  gabapentin (NEURONTIN) 800 MG tablet, Take 1 tablet (800 mg total) by mouth at bedtime as needed (neuropathy)., Disp: 90 tablet, Rfl: 1 .  HYDROcodone-acetaminophen (NORCO) 10-325 MG tablet, Take 1 tablet by mouth every 6 (six) hours as needed., Disp: , Rfl:  .  ipratropium (ATROVENT) 0.03 % nasal spray, Place 2 sprays into both nostrils 2 (two) times daily., Disp: , Rfl:  .  levothyroxine (SYNTHROID) 88 MCG tablet, TAKE 1 TABLET(88 MCG) BY MOUTH DAILY BEFORE BREAKFAST, Disp: 45 tablet, Rfl: 2 .  metolazone (ZAROXOLYN) 2.5 MG tablet, Take 1 tablet by mouth every Tuesday, Disp: 12 tablet, Rfl: 3 .  midodrine (PROAMATINE) 10 MG tablet, Take 1 tablet (10 mg total) by mouth 3 (three) times daily with meals., Disp: 270 tablet, Rfl: 3 .  mirtazapine (REMERON) 30 MG tablet, TAKE 1 TABLET BY MOUTH EVERY NIGHT AT BEDTIME, Disp: 30 tablet, Rfl: 3 .  montelukast (  SINGULAIR) 10 MG tablet, Take 10 mg by mouth at bedtime. , Disp: , Rfl: 1 .  ondansetron (ZOFRAN) 8 MG tablet, , Disp: , Rfl:  .  potassium chloride SA (KLOR-CON) 20 MEQ tablet, Take 2 tablets (40 mEq total) by mouth 3 (three) times daily. Take an extra 9meq(2 tablets) on the days you take the metolazone., Disp: 300 tablet, Rfl: 3 .  probenecid (BENEMID) 500 MG tablet, Take 500 mg by mouth 2 (two) times daily., Disp: , Rfl:  .  simvastatin (ZOCOR) 10 MG tablet, Take 10 mg by mouth every evening. , Disp: , Rfl:  .  spironolactone (ALDACTONE) 25 MG tablet, Take 1 tablet (25 mg total) by mouth  every evening., Disp: 90 tablet, Rfl: 3 .  torsemide (DEMADEX) 20 MG tablet, Take 3 tablets (60 mg total) by mouth 2 (two) times daily., Disp: 540 tablet, Rfl: 3 Allergies  Allergen Reactions  . Ceftin Anaphylaxis    Face and throat swell   . Geodon [Ziprasidone Hcl] Hives  . Lisinopril Other (See Comments)    angioedema  . Shellfish Allergy Other (See Comments)    Gout exacerbation  . Allopurinol Nausea Only and Other (See Comments)    weakness  . Ativan [Lorazepam] Itching  . Sulfa Antibiotics Itching  . Ultram [Tramadol Hcl] Itching  . Valium [Diazepam] Other (See Comments)    Patient states that diazepam doesn't relax, it has the opposite effect.     Social History   Socioeconomic History  . Marital status: Married    Spouse name: Not on file  . Number of children: 2  . Years of education: 77  . Highest education level: Master's degree (e.g., MA, MS, MEng, MEd, MSW, MBA)  Occupational History  . Occupation: retired  Tobacco Use  . Smoking status: Former Smoker    Packs/day: 0.10    Years: 26.00    Pack years: 2.60    Types: Cigarettes    Quit date: 05/06/2007    Years since quitting: 12.6  . Smokeless tobacco: Never Used  Vaping Use  . Vaping Use: Never used  Substance and Sexual Activity  . Alcohol use: No  . Drug use: No  . Sexual activity: Yes  Other Topics Concern  . 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 Difficulty of Paying Living Expenses:   Food Insecurity:   . Worried About Charity fundraiser in the Last Year:   . Arboriculturist in the Last Year:   Transportation Needs:    . Film/video editor (Medical):   Marland Kitchen Lack of Transportation (Non-Medical):   Physical Activity:   . Days of Exercise per Week:   . Minutes of Exercise per Session:   Stress:   . Feeling of Stress :   Social Connections:   . Frequency of Communication with Friends and Family:   . Frequency of Social Gatherings with Friends and Family:   . Attends Religious Services:   . Active Member of Clubs or Organizations:   .  Attends Archivist Meetings:   Marland Kitchen Marital Status:   Intimate Partner Violence:   . Fear of Current or Ex-Partner:   . Emotionally Abused:   Marland Kitchen Physically Abused:   . Sexually Abused:     Physical Exam Vitals reviewed.  Constitutional:      Appearance: She is normal weight.  HENT:     Head: Normocephalic.     Nose: Nose normal.     Mouth/Throat:     Mouth: Mucous membranes are moist.     Pharynx: Oropharynx is clear.  Eyes:     Pupils: Pupils are equal, round, and reactive to light.  Cardiovascular:     Rate and Rhythm: Normal rate and regular rhythm.     Pulses: Normal pulses.     Heart sounds: Normal heart sounds.  Pulmonary:     Effort: Pulmonary effort is normal.  Abdominal:     General: Abdomen is flat.     Palpations: Abdomen is soft.  Musculoskeletal:        General: Swelling present. Normal range of motion.     Cervical back: Normal range of motion.     Right lower leg: No edema.     Left lower leg: No edema.  Skin:    General: Skin is warm and dry.     Capillary Refill: Capillary refill takes less than 2 seconds.  Neurological:     Mental Status: She is alert. Mental status is at baseline.  Psychiatric:        Mood and Affect: Mood normal.     Arrived for [facility] visit for [facility]. Rhonda [name] reports feeling good today aside from her chronic back pain. Edema minimal, no JVD, lung sounds clear. Vitals as noted. Rhonda [name] was compliant with all medications for the last week. Medications verified and confirmed. Pill box filled accordingly.  Rhonda [name] requested I call her spinal doctor for refill request for her Hydrocodone which he is currently out of. I called and left message for Rhonda [name] with the CMA for Dr. Marcello Moores. Home visit complete. I will see Rhonda [name] in one week.   Refills:  Allegra (waiting on shipment) Hydrocodone (waiting on MD approval)    Future Appointments  Date Time Provider [place]  12/28/2019  3:15 PM Landis Martins, DPM [facility]  01/17/2020  7:30 AM [facility] DEVICE REMOTES [facility]  01/24/2020  8:55 AM [facility] DEVICE REMOTES [facility]  02/29/2020  8:50 AM Narda Amber K, DO [facility] None  03/14/2020 11:00 AM [facility] PA/NP [facility] None  04/17/2020  7:30 AM [facility] DEVICE REMOTES [facility]  06/12/2020 10:00 AM Philemon Kingdom, MD [facility] None  06/17/2020  7:30 AM [facility] DEVICE REMOTES [facility]  07/17/2020  7:30 AM [facility] DEVICE REMOTES [facility]     ACTION: Home visit completed Next visit planned for one week

## 2019-12-28 ENCOUNTER — Other Ambulatory Visit: Payer: Self-pay

## 2019-12-28 ENCOUNTER — Encounter: Payer: Self-pay | Admitting: Sports Medicine

## 2019-12-28 ENCOUNTER — Ambulatory Visit: Payer: Medicare HMO | Admitting: Sports Medicine

## 2019-12-28 DIAGNOSIS — B351 Tinea unguium: Secondary | ICD-10-CM

## 2019-12-28 DIAGNOSIS — E1142 Type 2 diabetes mellitus with diabetic polyneuropathy: Secondary | ICD-10-CM | POA: Diagnosis not present

## 2019-12-28 DIAGNOSIS — M79674 Pain in right toe(s): Secondary | ICD-10-CM

## 2019-12-28 DIAGNOSIS — M79675 Pain in left toe(s): Secondary | ICD-10-CM

## 2019-12-28 DIAGNOSIS — I739 Peripheral vascular disease, unspecified: Secondary | ICD-10-CM

## 2019-12-28 NOTE — Progress Notes (Signed)
Subjective: Rhonda [name] is a 69 y.o. female patient with history of diabetes who presents to office today complaining of long,mildly painful nails  while ambulating in shoes; unable to trim. Patient states that the glucose reading this morning was not recorded now considered diet controlled especially since she was diagnosed with IBS.  No other issues noted.  Patient Active Problem List   Diagnosis Date Noted  . Diabetes mellitus ([address]) 06/15/2019  . Hypokalemia 11/08/2018  . CKD (chronic kidney disease) stage 3, GFR 30-59 ml/min 11/08/2018  . Orthostatic hypotension 07/28/2017  . SVD (spontaneous vaginal delivery)   . Peripheral neuropathy   . On home oxygen therapy   . Migraines   . Left bundle branch block   . Hypothyroidism   . Hypertension   . Hyperlipidemia   . Heart murmur   . GERD (gastroesophageal reflux disease)   . Exertional shortness of breath   . Major depressive disorder   . Back pain   . Arthritis   . Generalized anxiety disorder   . Anemia   . Chronic respiratory failure ([place]) 09/14/2013  . 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 Morbid obesity ([place]) 11/01/2012  . Chronic systolic heart failure ([place]) 10/27/2012  . Endometrial polyp 01/20/2012  . Malignant tumor of breast ([place]) 03/26/2011  . Unspecified vitamin D deficiency 03/26/2011   Current Outpatient Medications on File Prior to Visit  Medication Sig Dispense Refill  . acetaZOLAMIDE (DIAMOX) 125 MG tablet TAKE 1 TABLET(125 MG) BY MOUTH DAILY 90 tablet 3  . carboxymethylcellulose (REFRESH PLUS) 0.5 % SOLN Place 1 drop into both eyes 3 (three) times daily as needed (dry eyes).    . carvedilol (COREG) 3.125 MG tablet TAKE 1 TABLET(3.125 MG) BY MOUTH TWICE DAILY WITH A MEAL 180 tablet 3  . citalopram (CELEXA) 20 MG tablet Take 20 mg by mouth daily.    . clonazePAM (KLONOPIN) 0.5 MG tablet Take 0.5 mg by mouth 3 (three) times daily as needed for anxiety.     .  DULoxetine (CYMBALTA) 30 MG capsule Take 30 mg by mouth daily. Take once in the morning.    . fexofenadine (ALLEGRA) 180 MG tablet Take 180 mg by mouth 2 (two) times daily.     . fludrocortisone (FLORINEF) 0.1 MG tablet TAKE 1 TABLET(0.1 MG) BY MOUTH TWICE DAILY 60 tablet 3  . gabapentin (NEURONTIN) 300 MG capsule Take 300 mg by mouth 3 (three) times daily.     Marland Kitchen gabapentin (NEURONTIN) 800 MG tablet Take 1 tablet (800 mg total) by mouth at bedtime as needed (neuropathy). 90 tablet 1  . HYDROcodone-acetaminophen (NORCO) 10-325 MG tablet Take 1 tablet by mouth every 6 (six) hours as needed.    Marland Kitchen ipratropium (ATROVENT) 0.03 % nasal spray Place 2 sprays into both nostrils 2 (two) times daily.    Marland Kitchen levothyroxine (SYNTHROID) 88 MCG tablet TAKE 1 TABLET(88 MCG) BY MOUTH DAILY BEFORE BREAKFAST 45 tablet 2  . metolazone (ZAROXOLYN) 2.5 MG tablet Take 1 tablet by mouth every Tuesday 12 tablet 3  . midodrine (PROAMATINE) 10 MG tablet Take 1 tablet (10 mg total) by mouth 3 (three) times daily with meals. 270 tablet 3  . mirtazapine (REMERON) 30 MG tablet TAKE 1 TABLET BY MOUTH EVERY NIGHT AT BEDTIME 30 tablet 3  . montelukast (SINGULAIR) 10 MG tablet Take 10 mg by mouth at bedtime.   1  . ondansetron (ZOFRAN) 8 MG tablet     . potassium chloride SA (KLOR-CON) 20  MEQ tablet Take 2 tablets (40 mEq total) by mouth 3 (three) times daily. Take an extra 31meq(2 tablets) on the days you take the metolazone. 300 tablet 3  . probenecid (BENEMID) 500 MG tablet Take 500 mg by mouth 2 (two) times daily.    . simvastatin (ZOCOR) 10 MG tablet Take 10 mg by mouth every evening.     Marland Kitchen spironolactone (ALDACTONE) 25 MG tablet Take 1 tablet (25 mg total) by mouth every evening. 90 tablet 3  . torsemide (DEMADEX) 20 MG tablet Take 3 tablets (60 mg total) by mouth 2 (two) times daily. 540 tablet 3   No current facility-administered medications on file prior to visit.   Allergies  Allergen Reactions  . Ceftin Anaphylaxis     Face and throat swell   . Geodon [Ziprasidone Hcl] Hives  . Lisinopril Other (See Comments)    angioedema  . Shellfish Allergy Other (See Comments)    Gout exacerbation  . Allopurinol Nausea Only and Other (See Comments)    weakness  . Ativan [Lorazepam] Itching  . Sulfa Antibiotics Itching  . Ultram [Tramadol Hcl] Itching  . Valium [Diazepam] Other (See Comments)    Patient states that diazepam doesn't relax, it has the opposite effect.    Recent Results (from the past 2160 hour(s))  CUP PACEART REMOTE DEVICE CHECK     Status: None   Collection Time: 10/18/19  1:23 PM  Result Value Ref Range   Date Time Interrogation Session [phone]    Pulse Generator Manufacturer MERM    Pulse Gen Model [id number] Viva XT CRT-D    Pulse Gen Serial Number [id number]    Clinic Name [facility]    Implantable Pulse Generator Type Cardiac Resynch Therapy Defibulator    Implantable Pulse Generator Implant Date [id number]    Implantable Lead Manufacturer [facility]    Implantable Lead Model 4396 Attain Ability Straight    Implantable Lead Serial Number [id number]    Implantable Lead Implant Date 26948546    Implantable Lead Location Detail 1 LATERAL_WALL    Implantable Lead Location [id number]    Implantable Lead Manufacturer MERM    Implantable Lead Model 5076 CapSureFix Novus    Implantable Lead Serial Number [id number]    Implantable Lead Implant Date 27035009    Implantable Lead Location Detail 1 APPENDAGE    Implantable Lead Location [id number]    Implantable Lead Manufacturer [facility]    Implantable Lead Model 6935 Sprint Quattro Secure S    Implantable Lead Serial Number [id number]    Implantable Lead Implant Date 38182993    Implantable Lead Location Detail 1 APEX    Implantable Lead Location [id number]    Lead Channel Setting Sensing Sensitivity 0.3 mV   Lead Channel Setting Pacing Amplitude 2 V   Lead Channel Setting Pacing Pulse Width 0.4 ms   Lead Channel Setting Pacing Amplitude 2.5 V   Lead  Channel Setting Pacing Pulse Width 0.4 ms   Lead Channel Setting Pacing Amplitude 2 V   Lead Channel Setting Pacing Capture Mode Adaptive Capture    Lead Channel Impedance Value 437 ohm   Lead Channel Sensing Intrinsic Amplitude 2 mV   Lead Channel Sensing Intrinsic Amplitude 2 mV   Lead Channel Pacing Threshold Amplitude 1 V   Lead Channel Pacing Threshold Pulse Width 0.4 ms   Lead Channel Impedance Value 532 ohm   Lead Channel Impedance Value 494 ohm   Lead Channel Sensing Intrinsic Amplitude 20.5 mV   Lead Consulting civil engineer  Amplitude 20.5 mV   Lead Channel Pacing Threshold Amplitude 0.625 V   Lead Channel Pacing Threshold Pulse Width 0.4 ms   HighPow Impedance 42 ohm   Lead Channel Impedance Value 513 ohm   Lead Channel Impedance Value 304 ohm   Lead Channel Impedance Value 304 ohm   Lead Channel Pacing Threshold Amplitude 0.875 V   Lead Channel Pacing Threshold Pulse Width 0.4 ms   Battery Status OK    Battery Remaining Longevity 6 mo   Battery Voltage 2.82 V   Brady Statistic RA Percent Paced 0.06 %   Brady Statistic RV Percent Paced 99.95 %   Brady Statistic AP VP Percent 0.05 %   Brady Statistic AS VP Percent 99.92 %   Brady Statistic AP VS Percent 0.01 %   Brady Statistic AS VS Percent 6.06 %  Basic Metabolic Panel (BMET)     Status: Abnormal   Collection Time: 11/08/19 10:04 AM  Result Value Ref Range   Sodium 141 135 - 145 mmol/L   Potassium 3.3 (L) 3.5 - 5.1 mmol/L   Chloride 105 98 - 111 mmol/L   CO2 24 22 - 32 mmol/L   Glucose, Bld 95 70 - 99 mg/dL    Comment: Glucose reference range applies only to samples taken after fasting for at least 8 hours.   BUN 26 (H) 8 - 23 mg/dL   Creatinine, Ser 1.40 (H) 0.44 - 1.00 mg/dL   Calcium 8.9 8.9 - 10.3 mg/dL   GFR calc non Af Amer 39 (L) >60 mL/min   GFR calc Af Amer 45 (L) >60 mL/min   Anion gap 12 5 - 15    Comment: Performed at [facility] [address], [place], [name] [address]  B Nat Peptide      Status: None   Collection Time: 11/08/19 10:04 AM  Result Value Ref Range   B Natriuretic Peptide 68.6 0.0 - 100.0 pg/mL    Comment: Performed at [facility] [address] [place], [place] [address]  B Nat Peptide     Status: None   Collection Time: 11/22/19 12:22 PM  Result Value Ref Range   B Natriuretic Peptide 85.1 0.0 - 100.0 pg/mL    Comment: Performed at [facility] [address], [place], [place] [address]  Basic Metabolic Panel (BMET)     Status: Abnormal   Collection Time: 11/22/19 12:22 PM  Result Value Ref Range   Sodium 140 135 - 145 mmol/L   Potassium 3.0 (L) 3.5 - 5.1 mmol/L   Chloride 101 98 - 111 mmol/L   CO2 28 22 - 32 mmol/L   Glucose, Bld 116 (H) 70 - 99 mg/dL    Comment: Glucose reference range applies only to samples taken after fasting for at least 8 hours.   BUN 29 (H) 8 - 23 mg/dL   Creatinine, Ser 1.43 (H) 0.44 - 1.00 mg/dL   Calcium 9.1 8.9 - 10.3 mg/dL   GFR calc non Af Amer 38 (L) >60 mL/min   GFR calc Af Amer 44 (L) >60 mL/min   Anion gap 11 5 - 15    Comment: Performed at [facility] [address] [place], [place] [address]  Basic Metabolic Panel (BMET)     Status: Abnormal   Collection Time: 11/29/19  1:42 PM  Result Value Ref Range   Sodium 141 135 - 145 mmol/L   Potassium 3.1 (L) 3.5 - 5.1 mmol/L  Chloride 102 98 - 111 mmol/L   CO2 26 22 - 32 mmol/L   Glucose, Bld 115 (H) 70 - 99 mg/dL    Comment: Glucose reference range applies only to samples taken after fasting for at least 8 hours.   BUN 31 (H) 8 - 23 mg/dL   Creatinine, Ser 1.41 (H) 0.44 - 1.00 mg/dL   Calcium 8.9 8.9 - 10.3 mg/dL   GFR calc non Af Amer 38 (L) >60 mL/min   GFR calc Af Amer 45 (L) >60 mL/min   Anion gap 13 5 - 15    Comment: Performed at [facility] [address], [place],  [address]  CUP PACEART REMOTE DEVICE CHECK     Status: None   Collection Time: 12/19/19  8:27 PM  Result Value Ref Range   Date Time Interrogation  Session [id number]    Pulse Generator Manufacturer MERM    Pulse Gen Model DTBA1D1 Viva XT CRT-D    Pulse Gen Serial Number [id number]    Clinic Name [facility]    Implantable Pulse Generator Type Cardiac Resynch Therapy Defibulator    Implantable Pulse Generator Implant Date 54627035    Implantable Lead Manufacturer MERM    Implantable Lead Model 4396 Attain Ability Straight    Implantable Lead Serial Number [id number]    Implantable Lead Implant Date 00938182    Implantable Lead Location Detail 1 LATERAL_WALL    Implantable Lead Location [id number]    Implantable Lead Manufacturer MERM    Implantable Lead Model 5076 CapSureFix Novus    Implantable Lead Serial Number [id number]    Implantable Lead Implant Date 99371696    Implantable Lead Location Detail 1 APPENDAGE    Implantable Lead Location [id number]    Implantable Lead Manufacturer [facility]    Implantable Lead Model 6935 Sprint Quattro Secure S    Implantable Lead Serial Number [id number]    Implantable Lead Implant Date 78938101    Implantable Lead Location Detail 1 APEX    Implantable Lead Location [id number]    Lead Channel Setting Sensing Sensitivity 0.3 mV   Lead Channel Setting Pacing Amplitude 2.25 V   Lead Channel Setting Pacing Pulse Width 0.4 ms   Lead Channel Setting Pacing Amplitude 2.5 V   Lead Channel Setting Pacing Pulse Width 0.4 ms   Lead Channel Setting Pacing Amplitude 2 V   Lead Channel Setting Pacing Capture Mode Adaptive Capture    Lead Channel Impedance Value 437 ohm   Lead Channel Sensing Intrinsic Amplitude 2.75 mV   Lead Channel Sensing Intrinsic Amplitude 2.75 mV   Lead Channel Pacing Threshold Amplitude 1.125 V   Lead Channel Pacing Threshold Pulse Width 0.4 ms   Lead Channel Impedance Value 570 ohm   Lead Channel Impedance Value 494 ohm   Lead Channel Sensing Intrinsic Amplitude 18.125 mV   Lead Channel Sensing Intrinsic Amplitude 18.125 mV   Lead Channel Pacing Threshold Amplitude 0.625 V   Lead  Channel Pacing Threshold Pulse Width 0.4 ms   HighPow Impedance 44 ohm   Lead Channel Impedance Value 589 ohm   Lead Channel Impedance Value 342 ohm   Lead Channel Impedance Value 380 ohm   Lead Channel Pacing Threshold Amplitude 1 V   Lead Channel Pacing Threshold Pulse Width 0.4 ms   Battery Status OK    Battery Remaining Longevity 5 mo   Battery Voltage 2.78 V   Brady Statistic RA Percent Paced 0.29 %   Brady Statistic RV Percent Paced 99.95 %  Brady Statistic AP VP Percent 0.28 %   Brady Statistic AS VP Percent 99.69 %   Brady Statistic AP VS Percent 0.01 %   Brady Statistic AS VS Percent 0.02 %    Objective: General: Patient is awake, alert, and oriented x 3 and in no acute distress.  Integument: Skin is warm, dry and supple bilateral. Nails are tender, long, thickened and  dystrophic with subungual debris, consistent with onychomycosis, 1-5 bilateral with trauma lines present bilateral hallux.  Dry skin bilateral.  No signs of infection. No open lesions or preulcerative lesions present bilateral. Remaining integument unremarkable.  Vasculature:  Dorsalis Pedis pulse 1/4 bilateral. Posterior Tibial pulse 1/4 bilateral.  Capillary fill time <3 sec 1-5 bilateral. Positive hair growth to the level of the digits. Temperature gradient within normal limits.  Mild varicosities present bilateral.  Trace ankle edema present bilateral.   Neurology: The patient has severely diminished sensation measured with a 5.07/10g Semmes Weinstein Monofilament at all pedal sites bilateral.  Musculoskeletal: Asymptomatic pes planus pedal deformities noted bilateral. Muscular strength 5/5 in all lower extremity muscular groups bilateral without pain on range of motion . No tenderness with calf compression bilateral.  Assessment and Plan: Problem List Items Addressed This Visit    None    Visit Diagnoses    Pain due to onychomycosis of toenails of both feet    -  Primary   Diabetic peripheral  neuropathy associated with type 2 diabetes mellitus (HCC)       PVD (peripheral vascular disease) ([place])          -Examined patient. -Discussed and educated patient on diabetic foot care, especially with  regards to the vascular, neurological and musculoskeletal systems.  -Stressed the importance of good glycemic control and the detriment of not  controlling glucose levels in relation to the foot. -Mechanically debrided all nails 1-5 bilateral using sterile nail nipper and filed with dremel without incident  -Advised patient to get over-the-counter Zyrtec and spraying lotion for dry skin since it is difficult for her to reach her feet to apply lotion -Answered all patient questions -Patient to return  in 3 months for at risk foot care -Patient advised to call the office if any problems or questions arise in the meantime.  Landis Martins, DPM

## 2020-01-01 ENCOUNTER — Other Ambulatory Visit (HOSPITAL_COMMUNITY): Payer: Self-pay

## 2020-01-01 MED ORDER — POTASSIUM CHLORIDE CRYS ER 20 MEQ PO TBCR: 40.0000 meq | EXTENDED_RELEASE_TABLET | Freq: Three times a day (TID) | ORAL | 3 refills | Status: DC

## 2020-01-02 ENCOUNTER — Other Ambulatory Visit (HOSPITAL_COMMUNITY): Payer: Self-pay

## 2020-01-02 NOTE — Progress Notes (Signed)
Paramedicine Encounter    Patient ID: Rhonda [name], female    DOB: 1951/05/11, 69 y.o.   MRN: [id number]   Patient Care Team: Maurice Small, MD as PCP - General (Family Medicine) [name], LCSW as Social Worker (Licensed Clinical Social Worker) [name], [name], DO as Consulting Physician (Neurology)  Patient Active Problem List   Diagnosis Date Noted  . Diabetes mellitus ([place]) 06/15/2019  . Hypokalemia 11/08/2018  . CKD (chronic kidney disease) stage 3, GFR 30-59 ml/min 11/08/2018  . Orthostatic hypotension 07/28/2017  . SVD (spontaneous vaginal delivery)   . Peripheral neuropathy   . On home oxygen therapy   . Migraines   . Left bundle branch block   . Hypothyroidism   . Hypertension   . Hyperlipidemia   . Heart murmur   . GERD (gastroesophageal reflux disease)   . Exertional shortness of breath   . Major depressive disorder   . Back pain   . Arthritis   . Generalized anxiety disorder   . Anemia   . Chronic respiratory failure ([place]) 09/14/2013  . 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 Morbid obesity ([place]) 11/01/2012  . Chronic systolic heart failure ([place]) 10/27/2012  . Endometrial polyp 01/20/2012  . Malignant tumor of breast ([place]) 03/26/2011  . Unspecified vitamin D deficiency 03/26/2011    Current Outpatient Medications:  .  acetaZOLAMIDE (DIAMOX) 125 MG tablet, TAKE 1 TABLET(125 MG) BY MOUTH DAILY, Disp: 90 tablet, Rfl: 3 .  carboxymethylcellulose (REFRESH PLUS) 0.5 % SOLN, Place 1 drop into both eyes 3 (three) times daily as needed (dry eyes)., Disp: , Rfl:  .  carvedilol (COREG) 3.125 MG tablet, TAKE 1 TABLET(3.125 MG) BY MOUTH TWICE DAILY WITH A MEAL, Disp: 180 tablet, Rfl: 3 .  citalopram (CELEXA) 20 MG tablet, Take 20 mg by mouth daily., Disp: , Rfl:  .  clonazePAM (KLONOPIN) 0.5 MG tablet, Take 0.5 mg by mouth 3 (three) times daily as needed for anxiety. , Disp: , Rfl:  .  DULoxetine (CYMBALTA) 30 MG capsule, Take  30 mg by mouth daily. Take once in the morning., Disp: , Rfl:  .  fexofenadine (ALLEGRA) 180 MG tablet, Take 180 mg by mouth 2 (two) times daily. , Disp: , Rfl:  .  fludrocortisone (FLORINEF) 0.1 MG tablet, TAKE 1 TABLET(0.1 MG) BY MOUTH TWICE DAILY, Disp: 60 tablet, Rfl: 3 .  gabapentin (NEURONTIN) 300 MG capsule, Take 300 mg by mouth 3 (three) times daily. , Disp: , Rfl:  .  gabapentin (NEURONTIN) 800 MG tablet, Take 1 tablet (800 mg total) by mouth at bedtime as needed (neuropathy)., Disp: 90 tablet, Rfl: 1 .  HYDROcodone-acetaminophen (NORCO) 10-325 MG tablet, Take 1 tablet by mouth every 6 (six) hours as needed., Disp: , Rfl:  .  ipratropium (ATROVENT) 0.03 % nasal spray, Place 2 sprays into both nostrils 2 (two) times daily., Disp: , Rfl:  .  levothyroxine (SYNTHROID) 88 MCG tablet, TAKE 1 TABLET(88 MCG) BY MOUTH DAILY BEFORE BREAKFAST, Disp: 45 tablet, Rfl: 2 .  metolazone (ZAROXOLYN) 2.5 MG tablet, Take 1 tablet by mouth every Tuesday, Disp: 12 tablet, Rfl: 3 .  midodrine (PROAMATINE) 10 MG tablet, Take 1 tablet (10 mg total) by mouth 3 (three) times daily with meals., Disp: 270 tablet, Rfl: 3 .  mirtazapine (REMERON) 30 MG tablet, TAKE 1 TABLET BY MOUTH EVERY NIGHT AT BEDTIME, Disp: 30 tablet, Rfl: 3 .  montelukast (SINGULAIR) 10 MG tablet, Take 10 mg by mouth at bedtime. ,  Disp: , Rfl: 1 .  ondansetron (ZOFRAN) 8 MG tablet, , Disp: , Rfl:  .  potassium chloride SA (KLOR-CON) 20 MEQ tablet, Take 2 tablets (40 mEq total) by mouth 3 (three) times daily. Take an extra 48meq(2 tablets) on the days you take the metolazone., Disp: 300 tablet, Rfl: 3 .  probenecid (BENEMID) 500 MG tablet, Take 500 mg by mouth 2 (two) times daily., Disp: , Rfl:  .  simvastatin (ZOCOR) 10 MG tablet, Take 10 mg by mouth every evening. , Disp: , Rfl:  .  spironolactone (ALDACTONE) 25 MG tablet, Take 1 tablet (25 mg total) by mouth every evening., Disp: 90 tablet, Rfl: 3 .  torsemide (DEMADEX) 20 MG tablet, Take 3  tablets (60 mg total) by mouth 2 (two) times daily., Disp: 540 tablet, Rfl: 3 Allergies  Allergen Reactions  . Ceftin Anaphylaxis    Face and throat swell   . Geodon [Ziprasidone Hcl] Hives  . Lisinopril Other (See Comments)    angioedema  . Shellfish Allergy Other (See Comments)    Gout exacerbation  . Allopurinol Nausea Only and Other (See Comments)    weakness  . Ativan [Lorazepam] Itching  . Sulfa Antibiotics Itching  . Ultram [Tramadol Hcl] Itching  . Valium [Diazepam] Other (See Comments)    Patient states that diazepam doesn't relax, it has the opposite effect.     Social History   Socioeconomic History  . Marital status: Married    Spouse name: Not on file  . Number of children: 2  . Years of education: 24  . Highest education level: Master's degree (e.g., MA, MS, MEng, MEd, MSW, MBA)  Occupational History  . Occupation: retired  Tobacco Use  . Smoking status: Former Smoker    Packs/day: 0.10    Years: 26.00    Pack years: 2.60    Types: Cigarettes    Quit date: 05/06/2007    Years since quitting: 12.6  . Smokeless tobacco: Never Used  Vaping Use  . Vaping Use: Never used  Substance and Sexual Activity  . Alcohol use: No  . Drug use: No  . Sexual activity: Yes  Other Topics Concern  . 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 Difficulty of Paying Living Expenses:   Food Insecurity:   . Worried About Charity fundraiser in the Last Year:   . Arboriculturist in the Last Year:   Transportation Needs:   . Film/video editor (Medical):   Marland Kitchen Lack of Transportation (Non-Medical):    Physical Activity:   . Days of Exercise per Week:   . Minutes of Exercise per Session:   Stress:   . Feeling of Stress :   Social Connections:   . Frequency of Communication with Friends and Family:   . Frequency of Social Gatherings with Friends and Family:   . Attends Religious Services:   . Active Member of Clubs or Organizations:   . Attends Archivist Meetings:   Marland Kitchen Marital  Status:   Intimate Partner Violence:   . Fear of Current or Ex-Partner:   . Emotionally Abused:   Marland Kitchen Physically Abused:   . Sexually Abused:     Physical Exam Vitals reviewed.  Constitutional:      Appearance: She is normal weight.  HENT:     Head: Normocephalic.     Nose: Nose normal.     Mouth/Throat:     Mouth: Mucous membranes are moist.     Pharynx: Oropharynx is clear.  Eyes:     Pupils: Pupils are equal, round, and reactive to light.  Cardiovascular:     Rate and Rhythm: Normal rate and regular rhythm.     Pulses: Normal pulses.     Heart sounds: Normal heart sounds.  Pulmonary:     Effort: Pulmonary effort is normal.     Breath sounds: Normal breath sounds.  Abdominal:     Palpations: Abdomen is soft.  Genitourinary:    Comments: Vaginal pain and discomfort.  Musculoskeletal:        General: Normal range of motion.     Cervical back: Normal range of motion.     Right lower leg: No edema.     Left lower leg: No edema.  Skin:    General: Skin is warm and dry.     Capillary Refill: Capillary refill takes less than 2 seconds.  Neurological:     Mental Status: She is alert. Mental status is at baseline.  Psychiatric:        Mood and Affect: Mood normal.      Arrived for home visit for Rhonda [name] who was seated in her home alert and oriented reporting she has some vaginal pain and discomfort. I called and scheduled OBGYN appointment with [facility] for Thursday at 1150. Patient agreed with same. Vitals obtained and are as noted. Medications were reviewed and  confirmed. Pill box filled accordingly. Rhonda [name] had no other complaints. Rhonda [name] noted to have a small weight gain, I encouraged patient to decrease fluid intake and be careful of what foods she is eating. Rhonda [name] agreed to get back on track and reduce fluid intake. Home visit complete. I will see Rhonda [name] in one week.   Refills: NONE    Future Appointments  Date Time Provider [place]  01/17/2020  7:30 AM [facility] DEVICE REMOTES [facility]  01/24/2020  8:55 AM [facility] DEVICE REMOTES [facility]  02/29/2020  8:50 AM Narda Amber K, DO [facility] None  03/14/2020 11:00 AM [facility] PA/NP [facility] None  04/04/2020  3:45 PM Landis Martins, DPM [facility]  04/17/2020  7:30 AM [facility] DEVICE REMOTES [facility]  06/12/2020 10:00 AM Philemon Kingdom, MD [facility] None  06/17/2020  7:30 AM [facility] DEVICE REMOTES [facility]  07/17/2020  7:30 AM [facility] DEVICE REMOTES [facility]     ACTION: Home visit completed Next visit planned for one weelk

## 2020-01-03 DIAGNOSIS — M48061 Spinal stenosis, lumbar region without neurogenic claudication: Secondary | ICD-10-CM

## 2020-01-03 HISTORY — DX: Spinal stenosis, lumbar region without neurogenic claudication: M48.061

## 2020-01-04 DIAGNOSIS — K589 Irritable bowel syndrome without diarrhea: Secondary | ICD-10-CM

## 2020-01-04 DIAGNOSIS — R102 Pelvic and perineal pain: Secondary | ICD-10-CM | POA: Diagnosis not present

## 2020-01-04 DIAGNOSIS — N760 Acute vaginitis: Secondary | ICD-10-CM | POA: Diagnosis not present

## 2020-01-04 HISTORY — DX: Irritable bowel syndrome, unspecified: K58.9

## 2020-01-09 ENCOUNTER — Other Ambulatory Visit (HOSPITAL_COMMUNITY): Payer: Self-pay

## 2020-01-09 NOTE — Progress Notes (Signed)
Paramedicine Encounter    Patient ID: Rhonda [name], female    DOB: 1951/02/14, 69 y.o.   MRN: [id number]   Patient Care Team: Maurice Small, MD as PCP - General (Family Medicine) [name], LCSW as Social Worker (Licensed Clinical Social Worker) [name], [name], DO as Consulting Physician (Neurology)  Patient Active Problem List   Diagnosis Date Noted  . Diabetes mellitus ([place]) 06/15/2019  . Hypokalemia 11/08/2018  . CKD (chronic kidney disease) stage 3, GFR 30-59 ml/min 11/08/2018  . Orthostatic hypotension 07/28/2017  . SVD (spontaneous vaginal delivery)   . Peripheral neuropathy   . On home oxygen therapy   . Migraines   . Left bundle branch block   . Hypothyroidism   . Hypertension   . Hyperlipidemia   . Heart murmur   . GERD (gastroesophageal reflux disease)   . Exertional shortness of breath   . Major depressive disorder   . Back pain   . Arthritis   . Generalized anxiety disorder   . Anemia   . Chronic respiratory failure ([place]) 09/14/2013  . 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 Morbid obesity ([place]) 11/01/2012  . Chronic systolic heart failure ([place]) 10/27/2012  . Endometrial polyp 01/20/2012  . Malignant tumor of breast ([place]) 03/26/2011  . Unspecified vitamin D deficiency 03/26/2011    Current Outpatient Medications:  .  acetaZOLAMIDE (DIAMOX) 125 MG tablet, TAKE 1 TABLET(125 MG) BY MOUTH DAILY, Disp: 90 tablet, Rfl: 3 .  carboxymethylcellulose (REFRESH PLUS) 0.5 % SOLN, Place 1 drop into both eyes 3 (three) times daily as needed (dry eyes)., Disp: , Rfl:  .  carvedilol (COREG) 3.125 MG tablet, TAKE 1 TABLET(3.125 MG) BY MOUTH TWICE DAILY WITH A MEAL, Disp: 180 tablet, Rfl: 3 .  citalopram (CELEXA) 20 MG tablet, Take 20 mg by mouth daily., Disp: , Rfl:  .  clonazePAM (KLONOPIN) 0.5 MG tablet, Take 0.5 mg by mouth 3 (three) times daily as needed for anxiety. , Disp: , Rfl:  .  DULoxetine (CYMBALTA) 30 MG capsule, Take  30 mg by mouth daily. Take once in the morning., Disp: , Rfl:  .  fexofenadine (ALLEGRA) 180 MG tablet, Take 180 mg by mouth 2 (two) times daily. , Disp: , Rfl:  .  fludrocortisone (FLORINEF) 0.1 MG tablet, TAKE 1 TABLET(0.1 MG) BY MOUTH TWICE DAILY, Disp: 60 tablet, Rfl: 3 .  gabapentin (NEURONTIN) 300 MG capsule, Take 300 mg by mouth 3 (three) times daily. , Disp: , Rfl:  .  gabapentin (NEURONTIN) 800 MG tablet, Take 1 tablet (800 mg total) by mouth at bedtime as needed (neuropathy)., Disp: 90 tablet, Rfl: 1 .  HYDROcodone-acetaminophen (NORCO) 10-325 MG tablet, Take 1 tablet by mouth every 6 (six) hours as needed., Disp: , Rfl:  .  ipratropium (ATROVENT) 0.03 % nasal spray, Place 2 sprays into both nostrils 2 (two) times daily., Disp: , Rfl:  .  levothyroxine (SYNTHROID) 88 MCG tablet, TAKE 1 TABLET(88 MCG) BY MOUTH DAILY BEFORE BREAKFAST, Disp: 45 tablet, Rfl: 2 .  metolazone (ZAROXOLYN) 2.5 MG tablet, Take 1 tablet by mouth every Tuesday, Disp: 12 tablet, Rfl: 3 .  midodrine (PROAMATINE) 10 MG tablet, Take 1 tablet (10 mg total) by mouth 3 (three) times daily with meals., Disp: 270 tablet, Rfl: 3 .  mirtazapine (REMERON) 30 MG tablet, TAKE 1 TABLET BY MOUTH EVERY NIGHT AT BEDTIME, Disp: 30 tablet, Rfl: 3 .  montelukast (SINGULAIR) 10 MG tablet, Take 10 mg by mouth at bedtime. ,  Disp: , Rfl: 1 .  ondansetron (ZOFRAN) 8 MG tablet, , Disp: , Rfl:  .  potassium chloride SA (KLOR-CON) 20 MEQ tablet, Take 2 tablets (40 mEq total) by mouth 3 (three) times daily. Take an extra 48meq(2 tablets) on the days you take the metolazone., Disp: 300 tablet, Rfl: 3 .  probenecid (BENEMID) 500 MG tablet, Take 500 mg by mouth 2 (two) times daily., Disp: , Rfl:  .  simvastatin (ZOCOR) 10 MG tablet, Take 10 mg by mouth every evening. , Disp: , Rfl:  .  spironolactone (ALDACTONE) 25 MG tablet, Take 1 tablet (25 mg total) by mouth every evening., Disp: 90 tablet, Rfl: 3 .  torsemide (DEMADEX) 20 MG tablet, Take 3  tablets (60 mg total) by mouth 2 (two) times daily., Disp: 540 tablet, Rfl: 3 Allergies  Allergen Reactions  . Ceftin Anaphylaxis    Face and throat swell   . Geodon [Ziprasidone Hcl] Hives  . Lisinopril Other (See Comments)    angioedema  . Shellfish Allergy Other (See Comments)    Gout exacerbation  . Allopurinol Nausea Only and Other (See Comments)    weakness  . Ativan [Lorazepam] Itching  . Sulfa Antibiotics Itching  . Ultram [Tramadol Hcl] Itching  . Valium [Diazepam] Other (See Comments)    Patient states that diazepam doesn't relax, it has the opposite effect.     Social History   Socioeconomic History  . Marital status: Married    Spouse name: Not on file  . Number of children: 2  . Years of education: 24  . Highest education level: Master's degree (e.g., MA, MS, MEng, MEd, MSW, MBA)  Occupational History  . Occupation: retired  Tobacco Use  . Smoking status: Former Smoker    Packs/day: 0.10    Years: 26.00    Pack years: 2.60    Types: Cigarettes    Quit date: 05/06/2007    Years since quitting: 12.6  . Smokeless tobacco: Never Used  Vaping Use  . Vaping Use: Never used  Substance and Sexual Activity  . Alcohol use: No  . Drug use: No  . Sexual activity: Yes  Other Topics Concern  . 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 Difficulty of Paying Living Expenses:   Food Insecurity:   . Worried About Charity fundraiser in the Last Year:   . Arboriculturist in the Last Year:   Transportation Needs:   . Film/video editor (Medical):   Marland Kitchen Lack of Transportation (Non-Medical):    Physical Activity:   . Days of Exercise per Week:   . Minutes of Exercise per Session:   Stress:   . Feeling of Stress :   Social Connections:   . Frequency of Communication with Friends and Family:   . Frequency of Social Gatherings with Friends and Family:   . Attends Religious Services:   . Active Member of Clubs or Organizations:   . Attends Archivist Meetings:   Marland Kitchen Marital  Status:   Intimate Partner Violence:   . Fear of Current or Ex-Partner:   . Emotionally Abused:   Marland Kitchen Physically Abused:   . Sexually Abused:     Physical Exam  Arrived for home visit for Wafa who was seated alert and oriented with no complaints reporting she missed a few doses of her nighttime pills last week. I reviewed medications and confirmed same and filled pill box accordingly. Vitals were obtained. Patient reports she has been feeling pretty good. Home visit complete. I will see her in one week.     Future Appointments  Date Time Provider [place]  01/17/2020  7:30 AM [facility] DEVICE REMOTES [facility]  01/24/2020  8:55 AM [facility] DEVICE REMOTES [facility]  02/29/2020  8:50 AM Narda Amber K, DO [facility] None  03/14/2020 11:00 AM [facility] PA/NP [facility] None  04/04/2020  3:45 PM Landis Martins, DPM [facility]  04/17/2020  7:30 AM [facility] DEVICE REMOTES [facility]  06/12/2020 10:00 AM Philemon Kingdom, MD [facility] None  06/17/2020  7:30 AM [facility] DEVICE REMOTES [facility]  07/17/2020  7:30 AM [facility] DEVICE REMOTES [facility]     ACTION: Home visit completed Next visit planned for one week

## 2020-01-12 ENCOUNTER — Telehealth: Payer: Self-pay | Admitting: Neurology

## 2020-01-12 NOTE — Telephone Encounter (Signed)
Patient states she needs help with her medication for her memory, but she isn't sure which medication she's taking. Please call.

## 2020-01-15 NOTE — Telephone Encounter (Signed)
Patient left message after hours requesting a call back about her medication. She said, "I've been more and more confused."

## 2020-01-16 ENCOUNTER — Other Ambulatory Visit (HOSPITAL_COMMUNITY): Payer: Self-pay

## 2020-01-16 NOTE — Telephone Encounter (Signed)
Left message for patient to contact office to address her concerns.   Spoke with patients spouse; gave him Dr Serita Grit recommendations. He stated for me to give the patient a call. Advised him that I left a message for the patient to contact the office. He states the patient will contact the office and schedule an appointment.

## 2020-01-16 NOTE — Progress Notes (Signed)
Paramedicine Encounter    Patient ID: Rhonda [name], female    DOB: 01/10/1951, 69 y.o.   MRN: [id number]   Patient Care Team: Maurice Small, MD as PCP - General (Family Medicine) [name], LCSW as Social Worker (Licensed Clinical Social Worker) [name], [name], DO as Consulting Physician (Neurology)  Patient Active Problem List   Diagnosis Date Noted  . Diabetes mellitus ([place]) 06/15/2019  . Hypokalemia 11/08/2018  . CKD (chronic kidney disease) stage 3, GFR 30-59 ml/min 11/08/2018  . Orthostatic hypotension 07/28/2017  . SVD (spontaneous vaginal delivery)   . Peripheral neuropathy   . On home oxygen therapy   . Migraines   . Left bundle branch block   . Hypothyroidism   . Hypertension   . Hyperlipidemia   . Heart murmur   . GERD (gastroesophageal reflux disease)   . Exertional shortness of breath   . Major depressive disorder   . Back pain   . Arthritis   . Generalized anxiety disorder   . Anemia   . Chronic respiratory failure ([place]) 09/14/2013  . 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 Morbid obesity ([place]) 11/01/2012  . Chronic systolic heart failure ([place]) 10/27/2012  . Endometrial polyp 01/20/2012  . Malignant tumor of breast ([place]) 03/26/2011  . Unspecified vitamin D deficiency 03/26/2011    Current Outpatient Medications:  .  acetaZOLAMIDE (DIAMOX) 125 MG tablet, TAKE 1 TABLET(125 MG) BY MOUTH DAILY, Disp: 90 tablet, Rfl: 3 .  carboxymethylcellulose (REFRESH PLUS) 0.5 % SOLN, Place 1 drop into both eyes 3 (three) times daily as needed (dry eyes)., Disp: , Rfl:  .  carvedilol (COREG) 3.125 MG tablet, TAKE 1 TABLET(3.125 MG) BY MOUTH TWICE DAILY WITH A MEAL, Disp: 180 tablet, Rfl: 3 .  citalopram (CELEXA) 20 MG tablet, Take 20 mg by mouth daily., Disp: , Rfl:  .  clonazePAM (KLONOPIN) 0.5 MG tablet, Take 0.5 mg by mouth 3 (three) times daily as needed for anxiety. , Disp: , Rfl:  .  DULoxetine (CYMBALTA) 30 MG capsule, Take  30 mg by mouth daily. Take once in the morning., Disp: , Rfl:  .  fexofenadine (ALLEGRA) 180 MG tablet, Take 180 mg by mouth 2 (two) times daily. , Disp: , Rfl:  .  fludrocortisone (FLORINEF) 0.1 MG tablet, TAKE 1 TABLET(0.1 MG) BY MOUTH TWICE DAILY, Disp: 60 tablet, Rfl: 3 .  gabapentin (NEURONTIN) 300 MG capsule, Take 300 mg by mouth 3 (three) times daily. , Disp: , Rfl:  .  gabapentin (NEURONTIN) 800 MG tablet, Take 1 tablet (800 mg total) by mouth at bedtime as needed (neuropathy)., Disp: 90 tablet, Rfl: 1 .  HYDROcodone-acetaminophen (NORCO) 10-325 MG tablet, Take 1 tablet by mouth every 6 (six) hours as needed., Disp: , Rfl:  .  ipratropium (ATROVENT) 0.03 % nasal spray, Place 2 sprays into both nostrils 2 (two) times daily., Disp: , Rfl:  .  levothyroxine (SYNTHROID) 88 MCG tablet, TAKE 1 TABLET(88 MCG) BY MOUTH DAILY BEFORE BREAKFAST, Disp: 45 tablet, Rfl: 2 .  metolazone (ZAROXOLYN) 2.5 MG tablet, Take 1 tablet by mouth every Tuesday, Disp: 12 tablet, Rfl: 3 .  midodrine (PROAMATINE) 10 MG tablet, Take 1 tablet (10 mg total) by mouth 3 (three) times daily with meals., Disp: 270 tablet, Rfl: 3 .  mirtazapine (REMERON) 30 MG tablet, TAKE 1 TABLET BY MOUTH EVERY NIGHT AT BEDTIME, Disp: 30 tablet, Rfl: 3 .  montelukast (SINGULAIR) 10 MG tablet, Take 10 mg by mouth at bedtime. ,  Disp: , Rfl: 1 .  ondansetron (ZOFRAN) 8 MG tablet, , Disp: , Rfl:  .  potassium chloride SA (KLOR-CON) 20 MEQ tablet, Take 2 tablets (40 mEq total) by mouth 3 (three) times daily. Take an extra 41meq(2 tablets) on the days you take the metolazone., Disp: 300 tablet, Rfl: 3 .  probenecid (BENEMID) 500 MG tablet, Take 500 mg by mouth 2 (two) times daily., Disp: , Rfl:  .  simvastatin (ZOCOR) 10 MG tablet, Take 10 mg by mouth every evening. , Disp: , Rfl:  .  spironolactone (ALDACTONE) 25 MG tablet, Take 1 tablet (25 mg total) by mouth every evening., Disp: 90 tablet, Rfl: 3 .  torsemide (DEMADEX) 20 MG tablet, Take 3  tablets (60 mg total) by mouth 2 (two) times daily., Disp: 540 tablet, Rfl: 3 Allergies  Allergen Reactions  . Ceftin Anaphylaxis    Face and throat swell   . Geodon [Ziprasidone Hcl] Hives  . Lisinopril Other (See Comments)    angioedema  . Shellfish Allergy Other (See Comments)    Gout exacerbation  . Allopurinol Nausea Only and Other (See Comments)    weakness  . Ativan [Lorazepam] Itching  . Sulfa Antibiotics Itching  . Ultram [Tramadol Hcl] Itching  . Valium [Diazepam] Other (See Comments)    Patient states that diazepam doesn't relax, it has the opposite effect.     Social History   Socioeconomic History  . Marital status: Married    Spouse name: Not on file  . Number of children: 2  . Years of education: 106  . Highest education level: Master's degree (e.g., MA, MS, MEng, MEd, MSW, MBA)  Occupational History  . Occupation: retired  Tobacco Use  . Smoking status: Former Smoker    Packs/day: 0.10    Years: 26.00    Pack years: 2.60    Types: Cigarettes    Quit date: 05/06/2007    Years since quitting: 12.7  . Smokeless tobacco: Never Used  Vaping Use  . Vaping Use: Never used  Substance and Sexual Activity  . Alcohol use: No  . Drug use: No  . Sexual activity: Yes  Other Topics Concern  . 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 Difficulty of Paying Living Expenses: Not on file  Food Insecurity:   . Worried About Charity fundraiser in the Last Year: Not on file  . Ran Out of Food in the Last Year: Not on file  Transportation Needs:   . Lack of Transportation (Medical): Not on file   . Lack of Transportation (Non-Medical): Not on file  Physical Activity:   . Days of Exercise per Week: Not on file  . Minutes of Exercise per Session: Not on file  Stress:   . Feeling of Stress : Not on file  Social Connections:   . Frequency of Communication with Friends and Family: Not on file  . Frequency of Social Gatherings with Friends and Family: Not on file  . Attends Religious Services: Not  on file  . Active Member of Clubs or Organizations: Not on file  . Attends Archivist Meetings: Not on file  . Marital Status: Not on file  Intimate Partner Violence:   . Fear of Current or Ex-Partner: Not on file  . Emotionally Abused: Not on file  . Physically Abused: Not on file  . Sexually Abused: Not on file    Physical Exam Vitals reviewed.  Constitutional:      Appearance: She is normal weight.  HENT:     Head: Normocephalic.     Nose: Nose normal.     Mouth/Throat:     Mouth: Mucous membranes are moist.     Pharynx: Oropharynx is clear.  Eyes:     Pupils: Pupils are equal, round, and reactive to light.  Cardiovascular:     Rate and Rhythm: Normal rate and regular rhythm.     Pulses: Normal pulses.     Heart sounds: Normal heart sounds.  Pulmonary:     Effort: Pulmonary effort is normal.     Breath sounds: Normal breath sounds.  Abdominal:     General: Abdomen is flat. There is no distension.     Palpations: Abdomen is soft.  Musculoskeletal:        General: Swelling present. Normal range of motion.     Right lower leg: Edema present.     Left lower leg: Edema present.  Skin:    General: Skin is warm and dry.     Capillary Refill: Capillary refill takes less than 2 seconds.  Neurological:     Mental Status: She is alert. Mental status is at baseline.  Psychiatric:        Mood and Affect: Mood normal.    Arrived for home visit for Rhonda [name] who was alert and oriented reporting both of her legs were sore and causing her to have to pause while walking  over the last day. Rhonda [name] had some swelling with edema noted but appeared to look as they always do. No new onset of trauma according to patient. Vitals obtained and are as noted. Rhonda [name] had not used her pain meds today but reports she may need to, I advised to take them as prescribed, she agreed. Medications were verified and confirmed. Pill box filled accordingly. Rhonda [name] has appointment with psychiatrist tomorrow. Rhonda [name] agreed to visit in one week. Home visit complete.   Refill- Clonazepam      Future Appointments  Date Time Provider [place]  01/17/2020  7:30 AM [facility]ICE [facility]  01/24/2020  8:55 AM [facility]ICE REMOT[facility]  02/29/2020  8:50 AM Narda Amber K, DO [facility] None  03/14/2020 11:00 AM [facility] PA/NP [facility] None  04/04/2020  3:45 PM Landis Martins, DPM [facility]  04/17/2020  7:30 AM [facility]ICE REMOTES [facility]  06/12/2020 10:00 AM Philemon Kingdom, MD [facility] None  06/17/2020  7:30 AM [facility]ICE [facility]ES [facility]  07/17/2020  7:30 AM [facility]     ACTION: Home visit completed Next visit planned for one week

## 2020-01-16 NOTE — Telephone Encounter (Signed)
Patient is not on any medications for memory, so she will need to clarify what she is referring to.  She takes gabapentin 800mg  at bedtime for neuropathy and diamox 125mg  for imbalance which I prescribe.   Her memory testing from last year suggested underlying depression/anxiety contributing to her memory changes and she was recommended to go back to see her counselor/psychiatrist.    If she has specific questions, will need to set up office or virtual visit to address.  Thanks. DP

## 2020-01-17 DIAGNOSIS — F0632 Mood disorder due to known physiological condition with major depressive-like episode: Secondary | ICD-10-CM | POA: Diagnosis not present

## 2020-01-23 ENCOUNTER — Other Ambulatory Visit (HOSPITAL_COMMUNITY): Payer: Self-pay

## 2020-01-23 NOTE — Progress Notes (Signed)
Paramedicine Encounter    Patient ID: Rhonda [name], female    DOB: [birth year], 69 y.o.   MRN: [id number]   Patient Care Team: Maurice Small, MD as PCP - General (Family Medicine) [name], LCSW as Social Worker (Licensed Clinical Social Worker) [name], [name], DO as Consulting Physician (Neurology)  Patient Active Problem List   Diagnosis Date Noted  . Diabetes mellitus ([place]) 06/15/2019  . Hypokalemia 11/08/2018  . CKD (chronic kidney disease) stage 3, GFR 30-59 ml/min 11/08/2018  . Orthostatic hypotension 07/28/2017  . SVD (spontaneous vaginal delivery)   . Peripheral neuropathy   . On home oxygen therapy   . Migraines   . Left bundle branch block   . Hypothyroidism   . Hypertension   . Hyperlipidemia   . Heart murmur   . GERD (gastroesophageal reflux disease)   . Exertional shortness of breath   . Major depressive disorder   . Back pain   . Arthritis   . Generalized anxiety disorder   . Anemia   . Chronic respiratory failure ([place]) 09/14/2013  . 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 Morbid obesity ([place]) 11/01/2012  . Chronic systolic heart failure ([place]) 10/27/2012  . Endometrial polyp 01/20/2012  . Malignant tumor of breast ([place]) 03/26/2011  . Unspecified vitamin D deficiency 03/26/2011    Current Outpatient Medications:  .  acetaZOLAMIDE (DIAMOX) 125 MG tablet, TAKE 1 TABLET(125 MG) BY MOUTH DAILY, Disp: 90 tablet, Rfl: 3 .  carboxymethylcellulose (REFRESH PLUS) 0.5 % SOLN, Place 1 drop into both eyes 3 (three) times daily as needed (dry eyes)., Disp: , Rfl:  .  carvedilol (COREG) 3.125 MG tablet, TAKE 1 TABLET(3.125 MG) BY MOUTH TWICE DAILY WITH A MEAL, Disp: 180 tablet, Rfl: 3 .  citalopram (CELEXA) 20 MG tablet, Take 20 mg by mouth daily., Disp: , Rfl:  .  clonazePAM (KLONOPIN) 0.5 MG tablet, Take 0.5 mg by mouth 3 (three) times daily as needed for anxiety. , Disp: , Rfl:  .  DULoxetine (CYMBALTA) 30 MG capsule, Take  30 mg by mouth daily. Take once in the morning., Disp: , Rfl:  .  fexofenadine (ALLEGRA) 180 MG tablet, Take 180 mg by mouth 2 (two) times daily. , Disp: , Rfl:  .  fludrocortisone (FLORINEF) 0.1 MG tablet, TAKE 1 TABLET(0.1 MG) BY MOUTH TWICE DAILY, Disp: 60 tablet, Rfl: 3 .  gabapentin (NEURONTIN) 300 MG capsule, Take 300 mg by mouth 3 (three) times daily. , Disp: , Rfl:  .  gabapentin (NEURONTIN) 800 MG tablet, Take 1 tablet (800 mg total) by mouth at bedtime as needed (neuropathy)., Disp: 90 tablet, Rfl: 1 .  HYDROcodone-acetaminophen (NORCO) 10-325 MG tablet, Take 1 tablet by mouth every 6 (six) hours as needed., Disp: , Rfl:  .  ipratropium (ATROVENT) 0.03 % nasal spray, Place 2 sprays into both nostrils 2 (two) times daily., Disp: , Rfl:  .  levothyroxine (SYNTHROID) 88 MCG tablet, TAKE 1 TABLET(88 MCG) BY MOUTH DAILY BEFORE BREAKFAST, Disp: 45 tablet, Rfl: 2 .  metolazone (ZAROXOLYN) 2.5 MG tablet, Take 1 tablet by mouth every Tuesday, Disp: 12 tablet, Rfl: 3 .  midodrine (PROAMATINE) 10 MG tablet, Take 1 tablet (10 mg total) by mouth 3 (three) times daily with meals., Disp: 270 tablet, Rfl: 3 .  mirtazapine (REMERON) 30 MG tablet, TAKE 1 TABLET BY MOUTH EVERY NIGHT AT BEDTIME, Disp: 30 tablet, Rfl: 3 .  montelukast (SINGULAIR) 10 MG tablet, Take 10 mg by mouth at bedtime. ,  Disp: , Rfl: 1 .  ondansetron (ZOFRAN) 8 MG tablet, , Disp: , Rfl:  .  potassium chloride SA (KLOR-CON) 20 MEQ tablet, Take 2 tablets (40 mEq total) by mouth 3 (three) times daily. Take an extra 94meq(2 tablets) on the days you take the metolazone., Disp: 300 tablet, Rfl: 3 .  probenecid (BENEMID) 500 MG tablet, Take 500 mg by mouth 2 (two) times daily., Disp: , Rfl:  .  simvastatin (ZOCOR) 10 MG tablet, Take 10 mg by mouth every evening. , Disp: , Rfl:  .  spironolactone (ALDACTONE) 25 MG tablet, Take 1 tablet (25 mg total) by mouth every evening., Disp: 90 tablet, Rfl: 3 .  torsemide (DEMADEX) 20 MG tablet, Take 3  tablets (60 mg total) by mouth 2 (two) times daily., Disp: 540 tablet, Rfl: 3 Allergies  Allergen Reactions  . Ceftin Anaphylaxis    Face and throat swell   . Geodon [Ziprasidone Hcl] Hives  . Lisinopril Other (See Comments)    angioedema  . Shellfish Allergy Other (See Comments)    Gout exacerbation  . Allopurinol Nausea Only and Other (See Comments)    weakness  . Ativan [Lorazepam] Itching  . Sulfa Antibiotics Itching  . Ultram [Tramadol Hcl] Itching  . Valium [Diazepam] Other (See Comments)    Patient states that diazepam doesn't relax, it has the opposite effect.     Social History   Socioeconomic History  . Marital status: Married    Spouse name: Not on file  . Number of children: 2  . Years of education: 52  . Highest education level: Master's degree (e.g., MA, MS, MEng, MEd, MSW, MBA)  Occupational History  . Occupation: retired  Tobacco Use  . Smoking status: Former Smoker    Packs/day: 0.10    Years: 26.00    Pack years: 2.60    Types: Cigarettes    Quit date: 05/06/2007    Years since quitting: 12.7  . Smokeless tobacco: Never Used  Vaping Use  . Vaping Use: Never used  Substance and Sexual Activity  . Alcohol use: No  . Drug use: No  . Sexual activity: Yes  Other Topics Concern  . 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 Difficulty of Paying Living Expenses: Not on file  Food Insecurity:   . Worried About Charity fundraiser in the Last Year: Not on file  . Ran Out of Food in the Last Year: Not on file  Transportation Needs:   . Lack of Transportation (Medical): Not on file   . Lack of Transportation (Non-Medical): Not on file  Physical Activity:   . Days of Exercise per Week: Not on file  . Minutes of Exercise per Session: Not on file  Stress:   . Feeling of Stress : Not on file  Social Connections:   . Frequency of Communication with Friends and Family: Not on file  . Frequency of Social Gatherings with Friends and Family: Not on file  . Attends Religious Services: Not  on file  . Active Member of Clubs or Organizations: Not on file  . Attends Archivist Meetings: Not on file  . Marital Status: Not on file  Intimate Partner Violence:   . Fear of Current or Ex-Partner: Not on file  . Emotionally Abused: Not on file  . Physically Abused: Not on file  . Sexually Abused: Not on file    Physical Exam Vitals reviewed.  Constitutional:      Appearance: She is normal weight.  HENT:     Head: Normocephalic.     Nose: Nose normal.     Mouth/Throat:     Mouth: Mucous membranes are moist.     Pharynx: Oropharynx is clear.  Eyes:     Pupils: Pupils are equal, round, and reactive to light.  Cardiovascular:     Rate and Rhythm: Normal rate and regular rhythm.     Pulses: Normal pulses.     Heart sounds: Normal heart sounds.  Pulmonary:     Effort: Pulmonary effort is normal.     Breath sounds: Normal breath sounds.  Abdominal:     Palpations: Abdomen is soft.  Musculoskeletal:        General: Normal range of motion.     Cervical back: Normal range of motion.     Right lower leg: No edema.     Left lower leg: No edema.  Skin:    General: Skin is warm and dry.     Capillary Refill: Capillary refill takes less than 2 seconds.  Neurological:     Mental Status: She is alert. Mental status is at baseline.  Psychiatric:        Mood and Affect: Mood normal.      Arrived for home visit for [facility] who was alert and oriented ambulating without difficulty or shortness of breath around her home. Rhonda [name]'s vitals were obtained and are as noted.  Medications were verified and pill box filled accordingly. Rhonda [name] was given her daily medications during our visit for her AM doses. Rhonda [name] denied any shortness of breath, chest pain or recent dizziness, falls or complaints. Rhonda [name] agreed to visit in one week. Home visit complete.   Refills:  NONE      Future Appointments  Date Time Provider [place]  01/24/2020  8:55 AM [facility] DEVICE REMOTES [facility]  02/29/2020  8:50 AM Narda Amber K, DO [facility] None  03/14/2020 11:00 AM [facility] PA/NP [facility] None  04/04/2020  3:45 PM Landis Martins, DPM [facility]  04/17/2020  7:30 AM [facility] DEVICE REMOTES [facility]  06/12/2020 10:00 AM Philemon Kingdom, MD [facility] None  06/17/2020  7:30 AM [facility] DEVICE REMOTES [facility]  07/17/2020  7:30 AM [facility] DEVICE REMOTES [facility]     ACTION: Home visit completed Next visit planned for one week

## 2020-01-28 IMAGING — CT CT HEAD W/O CM
3 series · 16 of 47 positions shown, 19 images · non-contrast
Comparison: MRI 08/05/2009, CT brain 01/30/2008

CLINICAL DATA: Blurry vision, difficulty walking

EXAM:
CT HEAD WITHOUT CONTRAST
TECHNIQUE: Contiguous axial images were obtained from the base of the skull
through the vertex without intravenous contrast.

[Series 3: head 5.0 h30s · axial · 0.42mm/px · z∈[-124,+16]mm · 10 of 34 slices shown, 13 images]
[im 3/34  brain]
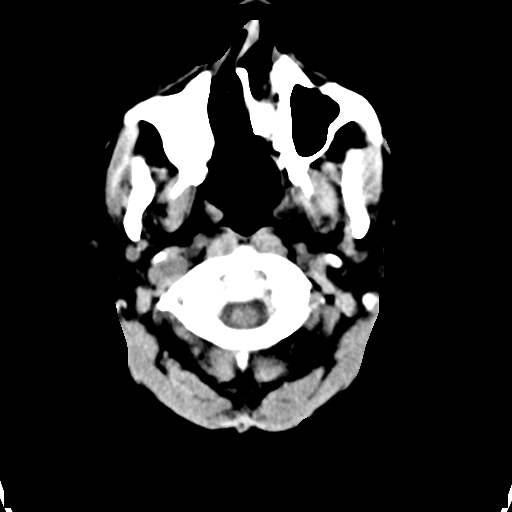
[im 3/34  bone]
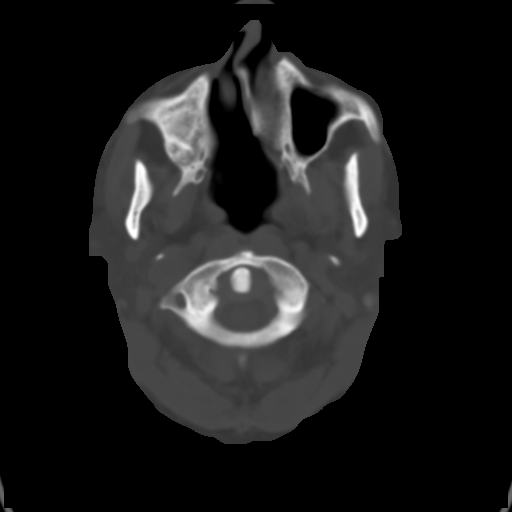
[im 6/34  brain]
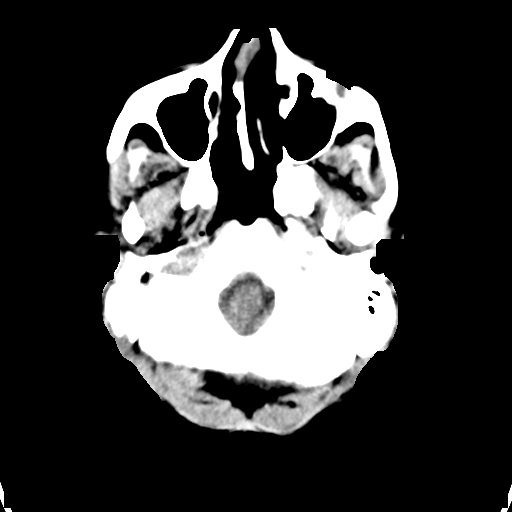
[im 10/34  brain]
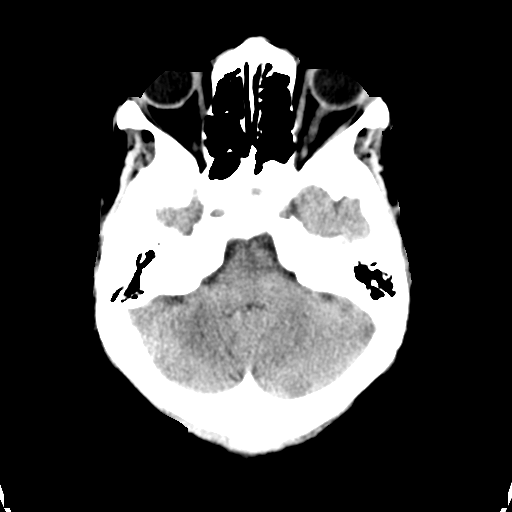
[im 12/34  brain]
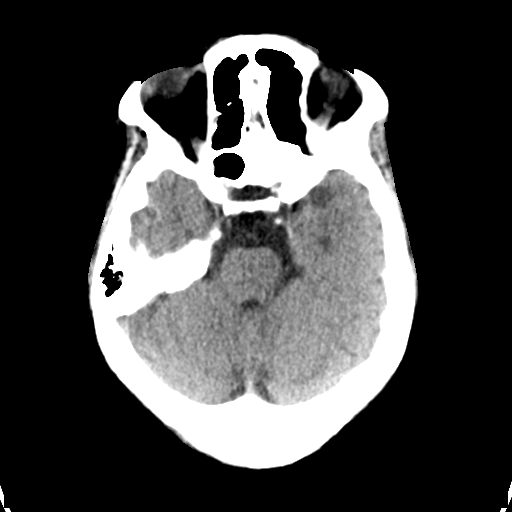
[im 15/34  brain]
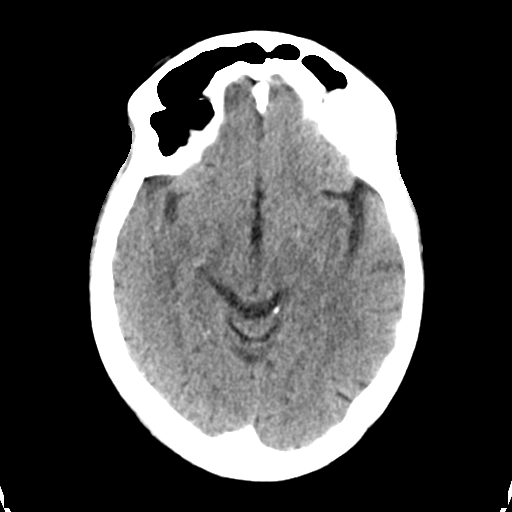
[im 15/34  bone]
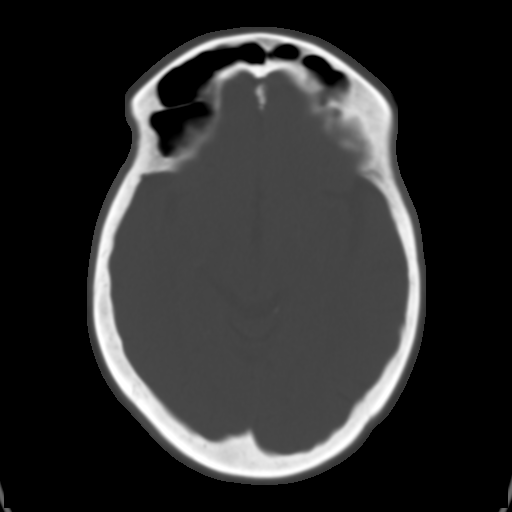
[im 19/34  brain]
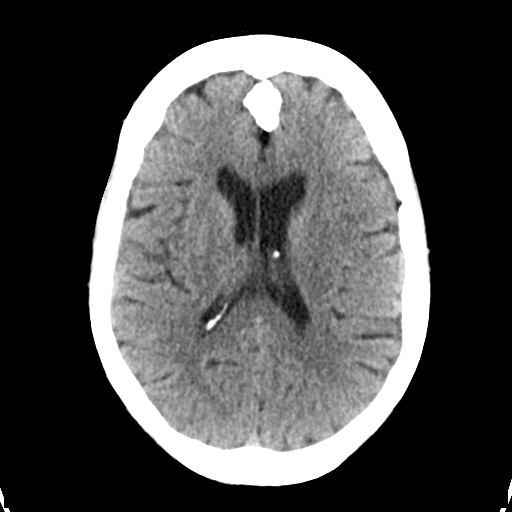
[im 22/34  brain]
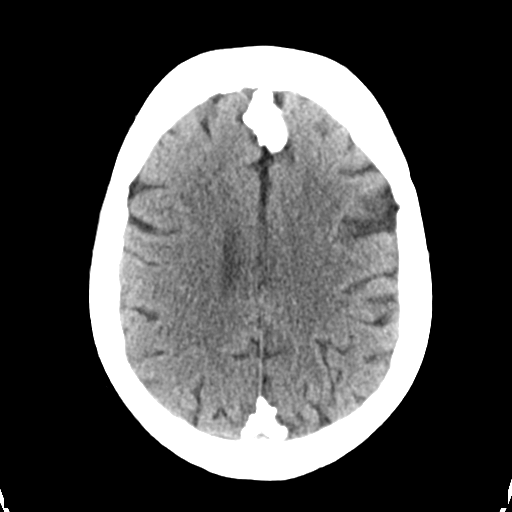
[im 26/34  brain]
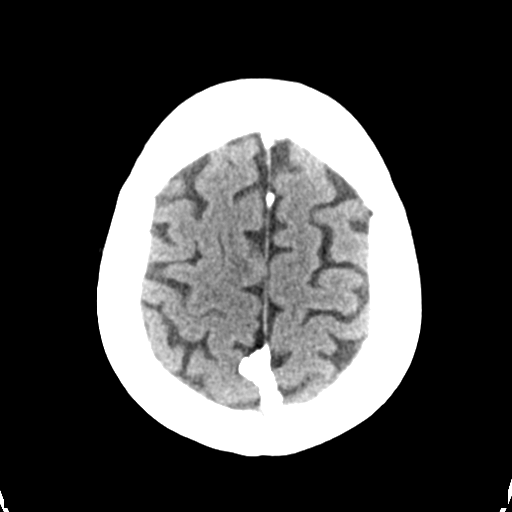
[im 28/34  brain]
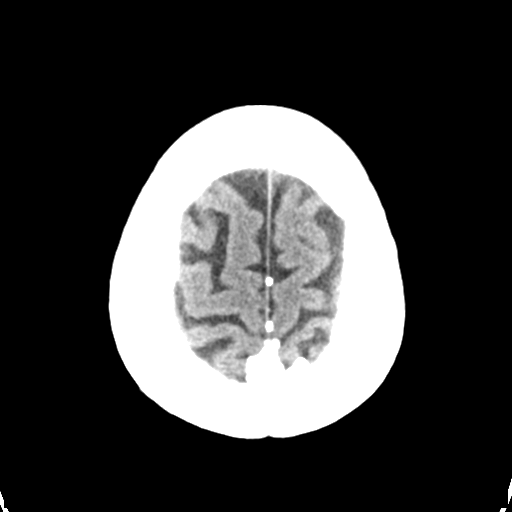
[im 28/34  bone]
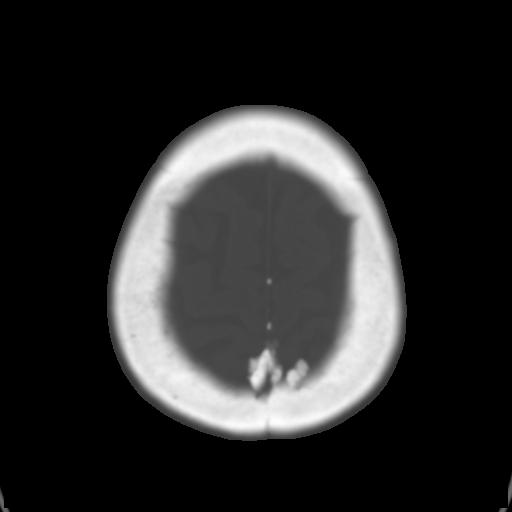
[im 31/34  brain]
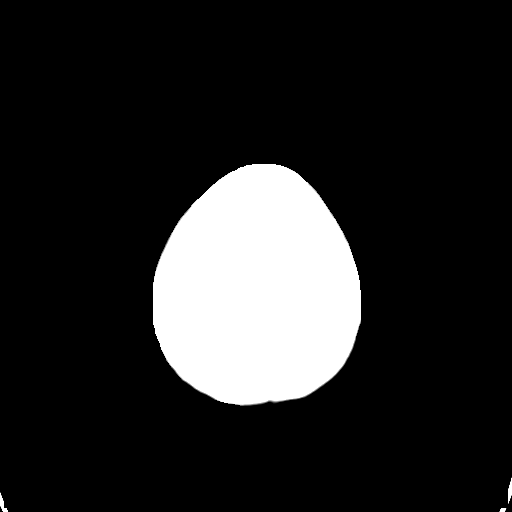

[Series 5: head 3.0 mpr cor · coronal · 0.34mm/px · 3 of 67 slices shown]
[im 23/67  brain]
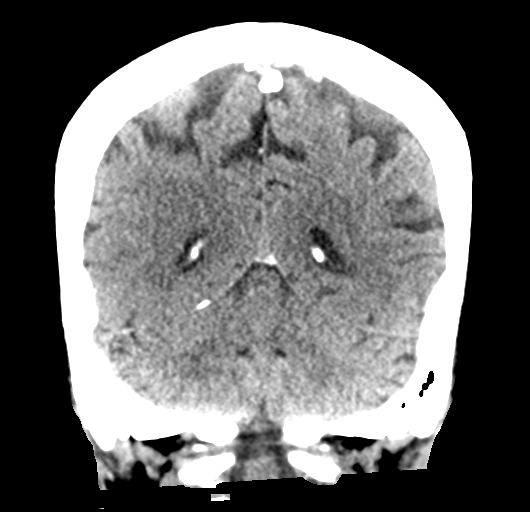
[im 30/67  brain]
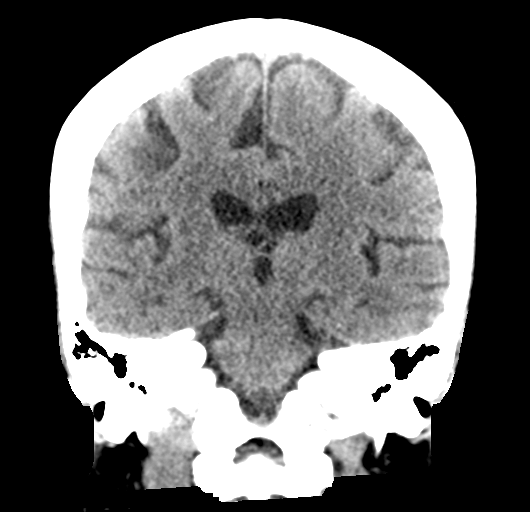
[im 37/67  brain]
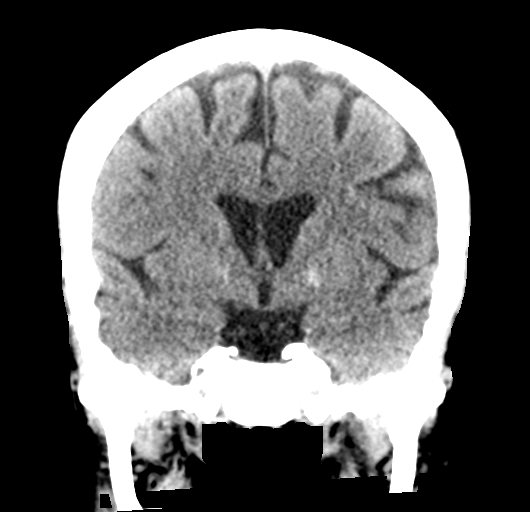

[Series 6: head 3.0 mpr sag · sagittal · 0.34mm/px · 3 of 54 slices shown]
[im 18/54  brain]
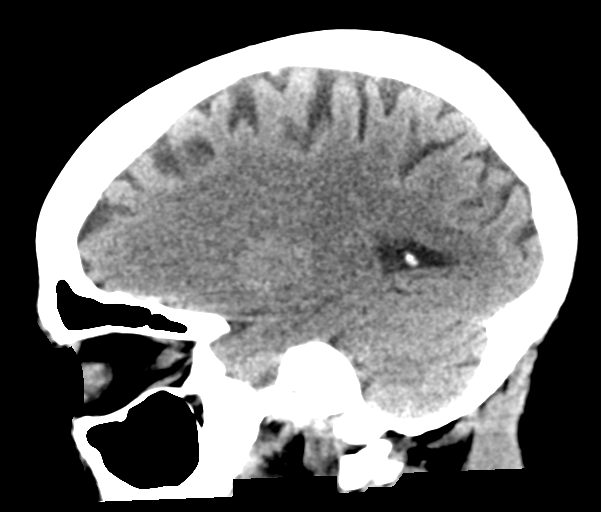
[im 27/54  brain]
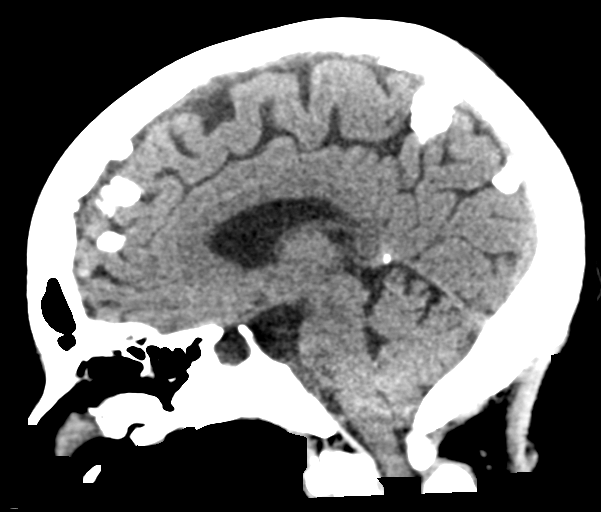
[im 36/54  brain]
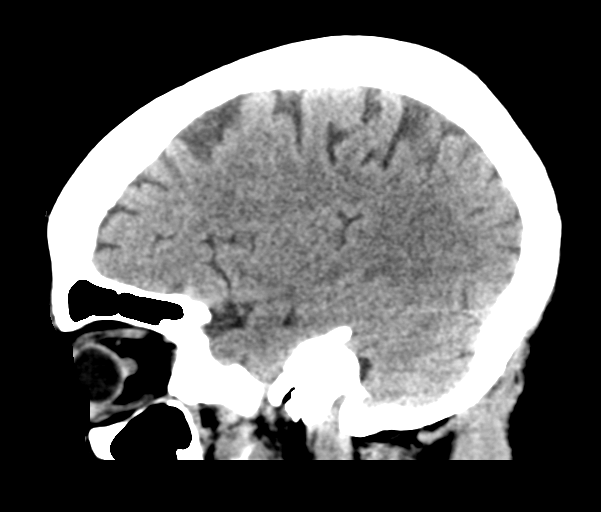

[16 of 47 positions shown; findings below may reference images not displayed]

FINDINGS: Brain: No acute territorial infarction, hemorrhage or intracranial
mass. The ventricles are nonenlarged. Chunky parafalcine
calcifications as before.

Vascular: No hyperdense vessels.  No unexpected calcification

Skull: Right cleft palate defect. Diffuse sclerosis of the calvarium
and skull base. No fracture

Sinuses/Orbits: Mild mucosal thickening in the maxillary and ethmoid
sinuses.

Other: None
IMPRESSION: 1. No CT evidence for acute intracranial abnormality.
2. Minimal atrophy.

## 2020-01-28 IMAGING — DX DG HIP (WITH OR WITHOUT PELVIS) 2-3V*R*
3 series · 3 of 3 positions shown · non-contrast
Comparison: None.

CLINICAL DATA: Fall with leg soreness today.

EXAM:
DG HIP (WITH OR WITHOUT PELVIS) 2-3V RIGHT

[pelvis ap]
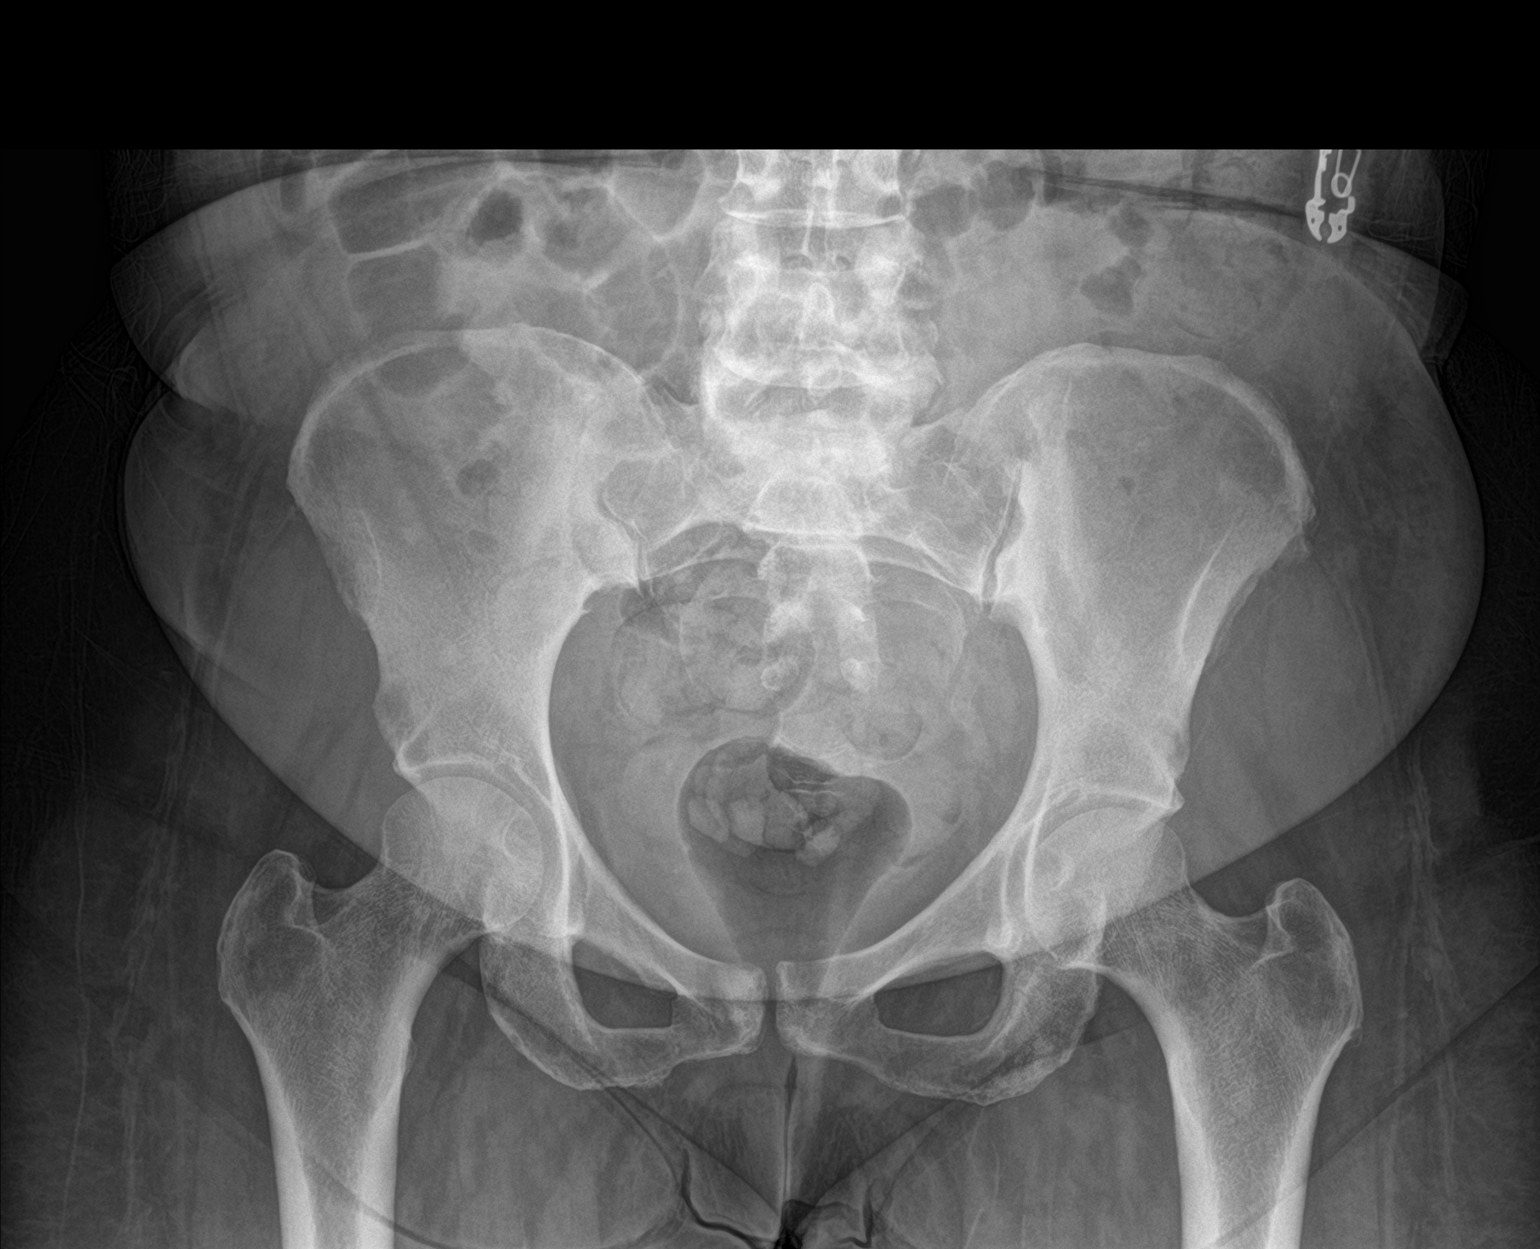

[hip ap]
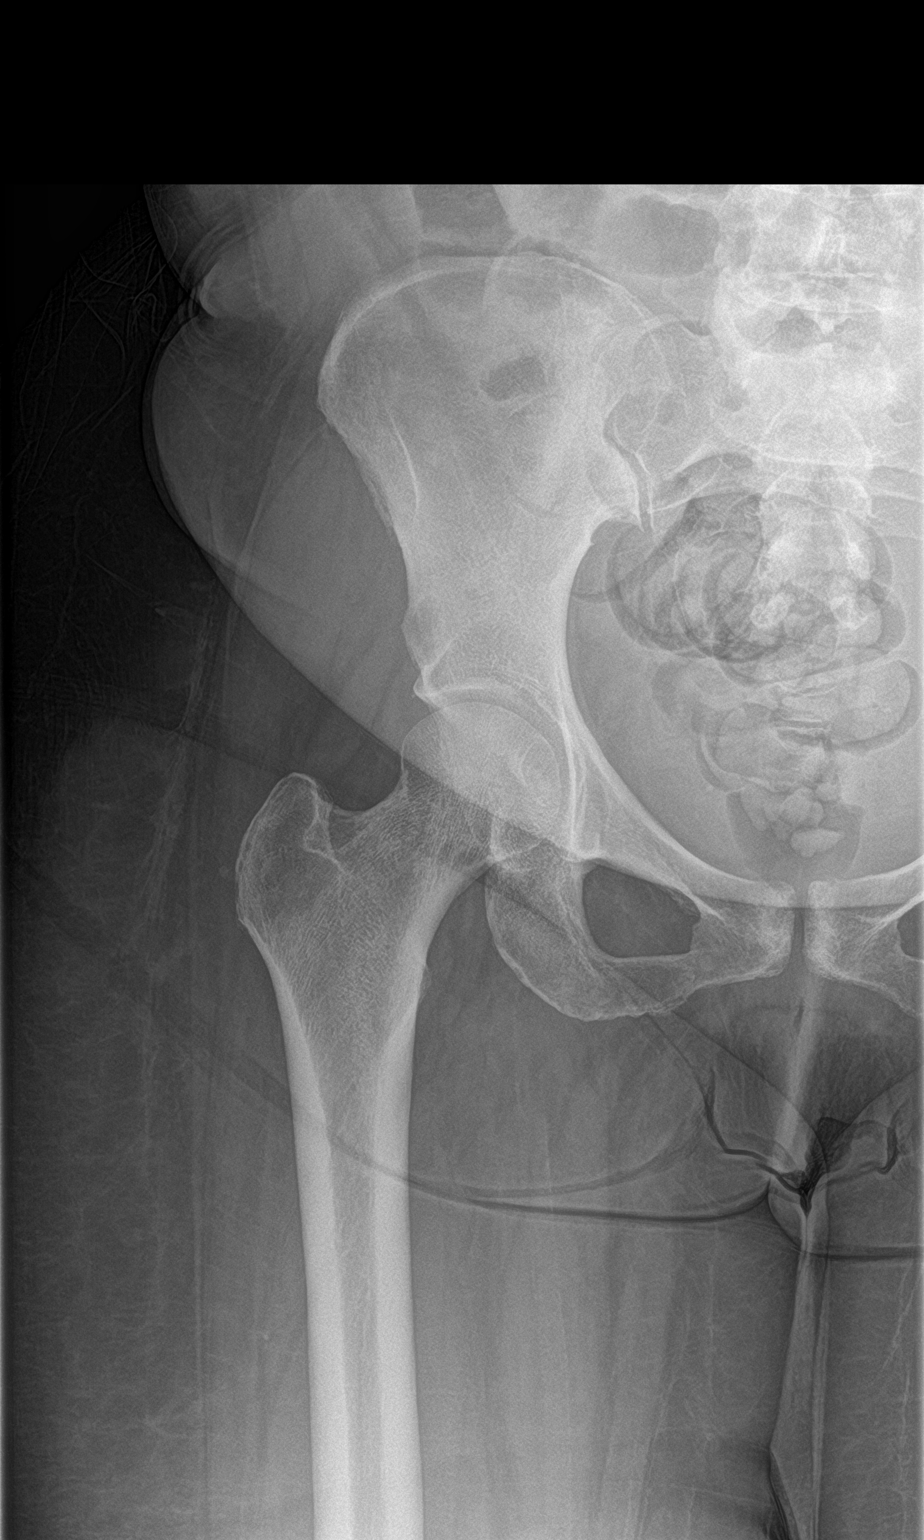

[hip lat]
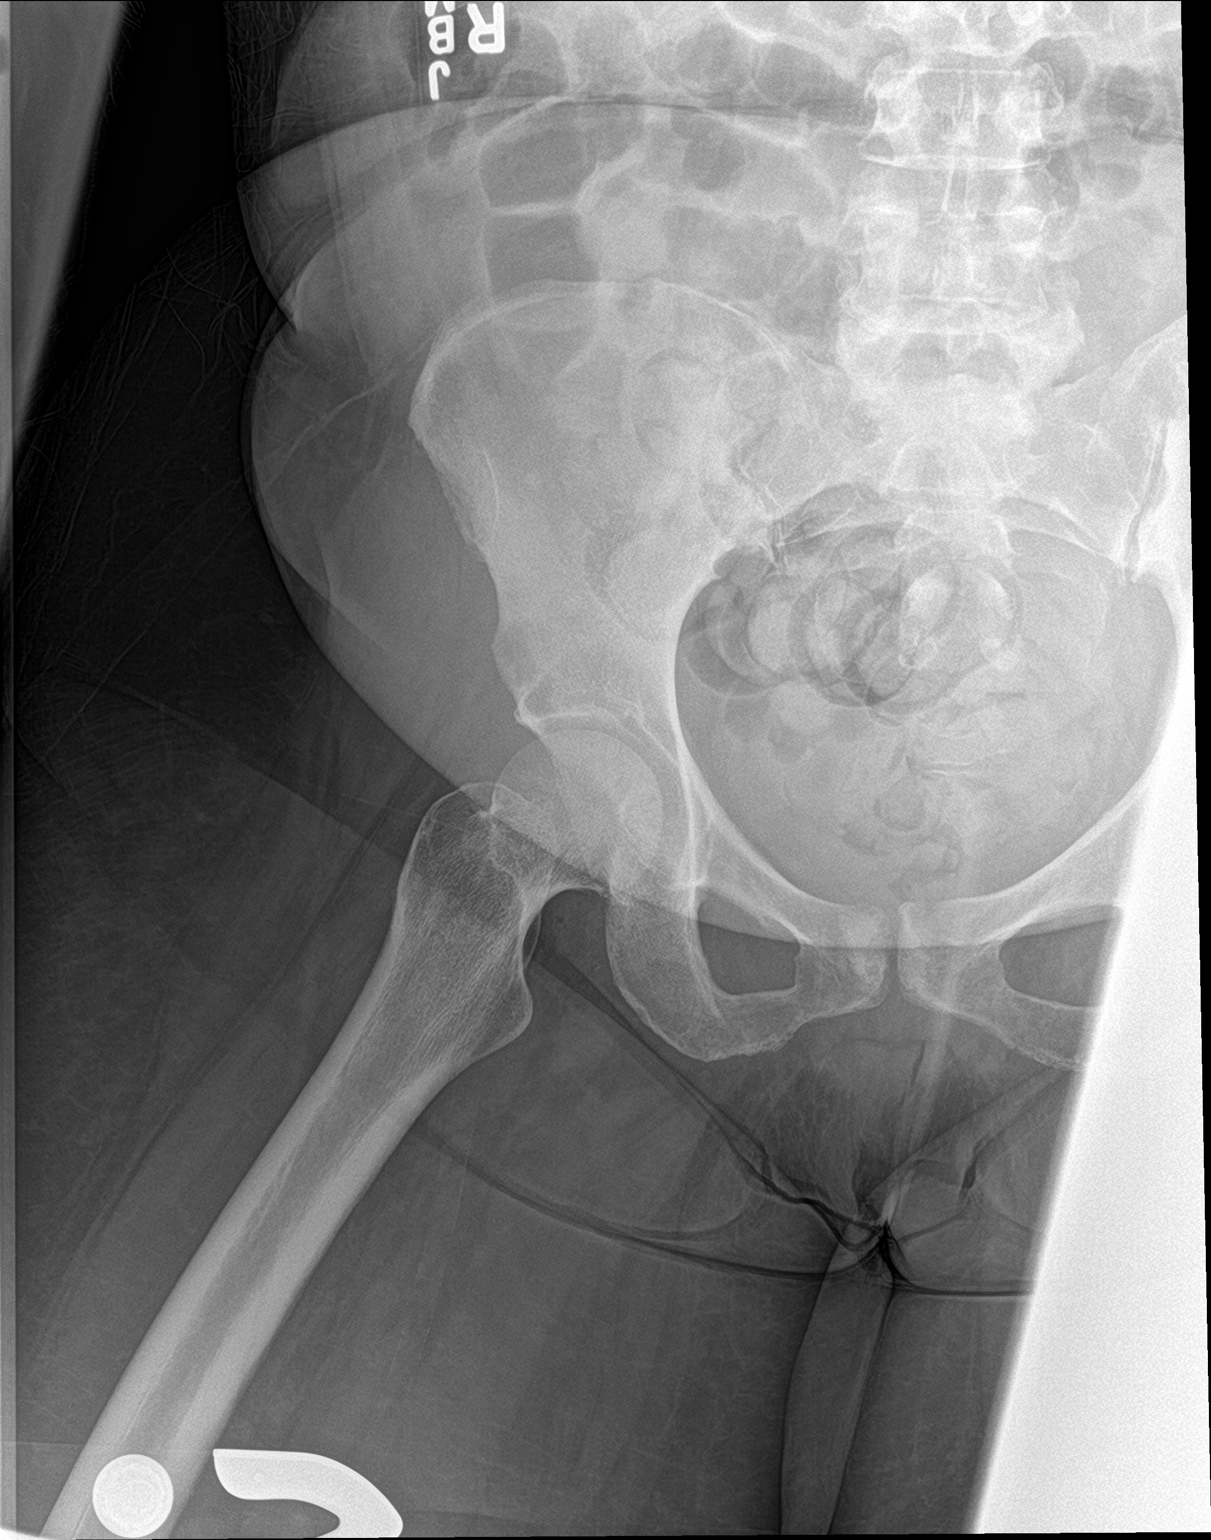

[3 of 3 positions shown; findings below may reference images not displayed]

FINDINGS: There is no evidence of hip fracture or dislocation. There is no
evidence of arthropathy or other focal bone abnormality.
IMPRESSION: Negative.

## 2020-01-30 ENCOUNTER — Other Ambulatory Visit (HOSPITAL_COMMUNITY): Payer: Self-pay

## 2020-01-30 NOTE — Progress Notes (Signed)
Paramedicine Encounter    Patient ID: Rhonda [name], female    DOB: [birth year], 69 y.o.   MRN: [id number]   Patient Care Team: Maurice Small, MD as PCP - General (Family Medicine) [name], LCSW as Social Worker (Licensed Clinical Social Worker) [name], [name], DO as Consulting Physician (Neurology)  Patient Active Problem List   Diagnosis Date Noted  . Diabetes mellitus ([place]) 06/15/2019  . Hypokalemia 11/08/2018  . CKD (chronic kidney disease) stage 3, GFR 30-59 ml/min 11/08/2018  . Orthostatic hypotension 07/28/2017  . SVD (spontaneous vaginal delivery)   . Peripheral neuropathy   . On home oxygen therapy   . Migraines   . Left bundle branch block   . Hypothyroidism   . Hypertension   . Hyperlipidemia   . Heart murmur   . GERD (gastroesophageal reflux disease)   . Exertional shortness of breath   . Major depressive disorder   . Back pain   . Arthritis   . Generalized anxiety disorder   . Anemia   . Chronic respiratory failure ([place]) 09/14/2013  . 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 Morbid obesity ([place]) 11/01/2012  . Chronic systolic heart failure ([place]) 10/27/2012  . Endometrial polyp 01/20/2012  . Malignant tumor of breast ([place]) 03/26/2011  . Unspecified vitamin D deficiency 03/26/2011    Current Outpatient Medications:  .  acetaZOLAMIDE (DIAMOX) 125 MG tablet, TAKE 1 TABLET(125 MG) BY MOUTH DAILY, Disp: 90 tablet, Rfl: 3 .  carboxymethylcellulose (REFRESH PLUS) 0.5 % SOLN, Place 1 drop into both eyes 3 (three) times daily as needed (dry eyes)., Disp: , Rfl:  .  carvedilol (COREG) 3.125 MG tablet, TAKE 1 TABLET(3.125 MG) BY MOUTH TWICE DAILY WITH A MEAL, Disp: 180 tablet, Rfl: 3 .  citalopram (CELEXA) 20 MG tablet, Take 20 mg by mouth daily., Disp: , Rfl:  .  clonazePAM (KLONOPIN) 0.5 MG tablet, Take 0.5 mg by mouth 3 (three) times daily as needed for anxiety. , Disp: , Rfl:  .  DULoxetine (CYMBALTA) 30 MG capsule, Take  30 mg by mouth daily. Take once in the morning., Disp: , Rfl:  .  fexofenadine (ALLEGRA) 180 MG tablet, Take 180 mg by mouth 2 (two) times daily. , Disp: , Rfl:  .  fludrocortisone (FLORINEF) 0.1 MG tablet, TAKE 1 TABLET(0.1 MG) BY MOUTH TWICE DAILY, Disp: 60 tablet, Rfl: 3 .  gabapentin (NEURONTIN) 300 MG capsule, Take 300 mg by mouth 3 (three) times daily. , Disp: , Rfl:  .  gabapentin (NEURONTIN) 800 MG tablet, Take 1 tablet (800 mg total) by mouth at bedtime as needed (neuropathy)., Disp: 90 tablet, Rfl: 1 .  HYDROcodone-acetaminophen (NORCO) 10-325 MG tablet, Take 1 tablet by mouth every 6 (six) hours as needed., Disp: , Rfl:  .  ipratropium (ATROVENT) 0.03 % nasal spray, Place 2 sprays into both nostrils 2 (two) times daily., Disp: , Rfl:  .  levothyroxine (SYNTHROID) 88 MCG tablet, TAKE 1 TABLET(88 MCG) BY MOUTH DAILY BEFORE BREAKFAST, Disp: 45 tablet, Rfl: 2 .  metolazone (ZAROXOLYN) 2.5 MG tablet, Take 1 tablet by mouth every Tuesday, Disp: 12 tablet, Rfl: 3 .  midodrine (PROAMATINE) 10 MG tablet, Take 1 tablet (10 mg total) by mouth 3 (three) times daily with meals., Disp: 270 tablet, Rfl: 3 .  mirtazapine (REMERON) 30 MG tablet, TAKE 1 TABLET BY MOUTH EVERY NIGHT AT BEDTIME, Disp: 30 tablet, Rfl: 3 .  montelukast (SINGULAIR) 10 MG tablet, Take 10 mg by mouth at bedtime. ,  Disp: , Rfl: 1 .  ondansetron (ZOFRAN) 8 MG tablet, , Disp: , Rfl:  .  potassium chloride SA (KLOR-CON) 20 MEQ tablet, Take 2 tablets (40 mEq total) by mouth 3 (three) times daily. Take an extra 58meq(2 tablets) on the days you take the metolazone., Disp: 300 tablet, Rfl: 3 .  probenecid (BENEMID) 500 MG tablet, Take 500 mg by mouth 2 (two) times daily., Disp: , Rfl:  .  simvastatin (ZOCOR) 10 MG tablet, Take 10 mg by mouth every evening. , Disp: , Rfl:  .  spironolactone (ALDACTONE) 25 MG tablet, Take 1 tablet (25 mg total) by mouth every evening., Disp: 90 tablet, Rfl: 3 .  torsemide (DEMADEX) 20 MG tablet, Take 3  tablets (60 mg total) by mouth 2 (two) times daily., Disp: 540 tablet, Rfl: 3 Allergies  Allergen Reactions  . Ceftin Anaphylaxis    Face and throat swell   . Geodon [Ziprasidone Hcl] Hives  . Lisinopril Other (See Comments)    angioedema  . Shellfish Allergy Other (See Comments)    Gout exacerbation  . Allopurinol Nausea Only and Other (See Comments)    weakness  . Ativan [Lorazepam] Itching  . Sulfa Antibiotics Itching  . Ultram [Tramadol Hcl] Itching  . Valium [Diazepam] Other (See Comments)    Patient states that diazepam doesn't relax, it has the opposite effect.     Social History   Socioeconomic History  . Marital status: Married    Spouse name: Not on file  . Number of children: 2  . Years of education: 54  . Highest education level: Master's degree (e.g., MA, MS, MEng, MEd, MSW, MBA)  Occupational History  . Occupation: retired  Tobacco Use  . Smoking status: Former Smoker    Packs/day: 0.10    Years: 26.00    Pack years: 2.60    Types: Cigarettes    Quit date: 05/06/2007    Years since quitting: 12.7  . Smokeless tobacco: Never Used  Vaping Use  . Vaping Use: Never used  Substance and Sexual Activity  . Alcohol use: No  . Drug use: No  . Sexual activity: Yes  Other Topics Concern  . 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 Difficulty of Paying Living Expenses: Not on file  Food Insecurity:   . Worried About Charity fundraiser in the Last Year: Not on file  . Ran Out of Food in the Last Year: Not on file  Transportation Needs:   . Lack of Transportation (Medical): Not on file   . Lack of Transportation (Non-Medical): Not on file  Physical Activity:   . Days of Exercise per Week: Not on file  . Minutes of Exercise per Session: Not on file  Stress:   . Feeling of Stress : Not on file  Social Connections:   . Frequency of Communication with Friends and Family: Not on file  . Frequency of Social Gatherings with Friends and Family: Not on file  . Attends Religious Services: Not  on file  . Active Member of Clubs or Organizations: Not on file  . Attends Archivist Meetings: Not on file  . Marital Status: Not on file  Intimate Partner Violence:   . Fear of Current or Ex-Partner: Not on file  . Emotionally Abused: Not on file  . Physically Abused: Not on file  . Sexually Abused: Not on file    Physical Exam Vitals reviewed.  Constitutional:      Appearance: She is normal weight.  HENT:     Head: Normocephalic.     Nose: Nose normal.     Mouth/Throat:     Mouth: Mucous membranes are moist.     Pharynx: Oropharynx is clear.  Eyes:     Pupils: Pupils are equal, round, and reactive to light.  Cardiovascular:     Rate and Rhythm: Normal rate and regular rhythm.     Pulses: Normal pulses.     Heart sounds: Normal heart sounds.  Pulmonary:     Effort: Pulmonary effort is normal.     Breath sounds: Normal breath sounds.  Abdominal:     Palpations: Abdomen is soft.  Musculoskeletal:        General: Swelling present. Normal range of motion.     Cervical back: Normal range of motion.     Right lower leg: Edema present.     Left lower leg: Edema present.     Comments: Open cyst on back of left leg, red and warm to touch.   Skin:    General: Skin is warm and dry.     Capillary Refill: Capillary refill takes less than 2 seconds.  Neurological:     Mental Status: She is alert. Mental status is at baseline.  Psychiatric:        Mood and Affect: Mood normal.    Arrived for home visit for Mrs. Cost where she was ambulating around her living room  reporting she had a cyst rupture on the back of her leg. I examined same. Skin flat, red and some pus excreting from site. Area was cleaned and dried. Mrs. Marts was advised to make sure to keep it clean and dry. She agreed. We will reassess next week. Mrs. Mckown had increase in weight and swelling this week, she reports its due to eating a lot of fast food and drinking a lot of fluids over the last week. I advised Mrs. Danziger the importance of decreasing her fluid intake and of her diet restrictions. She agreed with same. Vitals were obtained and as noted. Medications were verified and confirmed. Pill box filled accordingly. I will see Mrs. Balthaser in one week. Home visit complete.   Refills: Clonazepam     Future Appointments  Date Time Provider [place]  02/29/2020  8:50 AM Narda Amber K, DO [facility] None  03/14/2020 11:00 AM [facility] PA/NP [facility] None  04/04/2020  3:45 PM Landis Martins, DPM [facility]  04/17/2020  7:30 AM [facility] DEVICE REMOTES [facility]  06/12/2020 10:00 AM Philemon Kingdom, MD [facility] None  06/17/2020  7:30 AM [facility] DEVICE REMOTES [facility]  07/17/2020  7:30 AM [facility] DEVICE REMOTES [facility]     ACTION: Home visit completed Next visit planned for one week

## 2020-02-02 NOTE — Progress Notes (Signed)
No ICM remote transmission received for 01/24/2020 and next ICM transmission scheduled for 02/06/2020.

## 2020-02-06 ENCOUNTER — Other Ambulatory Visit (HOSPITAL_COMMUNITY): Payer: Self-pay

## 2020-02-06 ENCOUNTER — Ambulatory Visit (INDEPENDENT_AMBULATORY_CARE_PROVIDER_SITE_OTHER): Payer: Medicare HMO

## 2020-02-06 DIAGNOSIS — I5022 Chronic systolic (congestive) heart failure: Secondary | ICD-10-CM | POA: Diagnosis not present

## 2020-02-06 DIAGNOSIS — Z95810 Presence of automatic (implantable) cardiac defibrillator: Secondary | ICD-10-CM | POA: Diagnosis not present

## 2020-02-06 NOTE — Progress Notes (Signed)
Paramedicine Encounter    Patient ID: Rhonda [name], female    DOB: 1951/05/11, 69 y.o.   MRN: [id number]   Patient Care Team: Maurice Small, MD as PCP - General (Family Medicine) [name], LCSW as Social Worker (Licensed Clinical Social Worker) [name], [name], DO as Consulting Physician (Neurology)  Patient Active Problem List   Diagnosis Date Noted  . Diabetes mellitus ([place]) 06/15/2019  . Hypokalemia 11/08/2018  . CKD (chronic kidney disease) stage 3, GFR 30-59 ml/min 11/08/2018  . Orthostatic hypotension 07/28/2017  . SVD (spontaneous vaginal delivery)   . Peripheral neuropathy   . On home oxygen therapy   . Migraines   . Left bundle branch block   . Hypothyroidism   . Hypertension   . Hyperlipidemia   . Heart murmur   . GERD (gastroesophageal reflux disease)   . Exertional shortness of breath   . Major depressive disorder   . Back pain   . Arthritis   . Generalized anxiety disorder   . Anemia   . Chronic respiratory failure ([place]) 09/14/2013  . 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 Morbid obesity ([place]) 11/01/2012  . Chronic systolic heart failure ([place]) 10/27/2012  . Endometrial polyp 01/20/2012  . Malignant tumor of breast ([place]) 03/26/2011  . Unspecified vitamin D deficiency 03/26/2011    Current Outpatient Medications:  .  acetaZOLAMIDE (DIAMOX) 125 MG tablet, TAKE 1 TABLET(125 MG) BY MOUTH DAILY, Disp: 90 tablet, Rfl: 3 .  carboxymethylcellulose (REFRESH PLUS) 0.5 % SOLN, Place 1 drop into both eyes 3 (three) times daily as needed (dry eyes)., Disp: , Rfl:  .  carvedilol (COREG) 3.125 MG tablet, TAKE 1 TABLET(3.125 MG) BY MOUTH TWICE DAILY WITH A MEAL, Disp: 180 tablet, Rfl: 3 .  citalopram (CELEXA) 20 MG tablet, Take 20 mg by mouth daily., Disp: , Rfl:  .  clonazePAM (KLONOPIN) 0.5 MG tablet, Take 0.5 mg by mouth 3 (three) times daily as needed for anxiety. , Disp: , Rfl:  .  DULoxetine (CYMBALTA) 30 MG capsule, Take  30 mg by mouth daily. Take once in the morning., Disp: , Rfl:  .  fexofenadine (ALLEGRA) 180 MG tablet, Take 180 mg by mouth 2 (two) times daily. , Disp: , Rfl:  .  fludrocortisone (FLORINEF) 0.1 MG tablet, TAKE 1 TABLET(0.1 MG) BY MOUTH TWICE DAILY, Disp: 60 tablet, Rfl: 3 .  gabapentin (NEURONTIN) 300 MG capsule, Take 300 mg by mouth 3 (three) times daily. , Disp: , Rfl:  .  gabapentin (NEURONTIN) 800 MG tablet, Take 1 tablet (800 mg total) by mouth at bedtime as needed (neuropathy)., Disp: 90 tablet, Rfl: 1 .  HYDROcodone-acetaminophen (NORCO) 10-325 MG tablet, Take 1 tablet by mouth every 6 (six) hours as needed., Disp: , Rfl:  .  ipratropium (ATROVENT) 0.03 % nasal spray, Place 2 sprays into both nostrils 2 (two) times daily., Disp: , Rfl:  .  levothyroxine (SYNTHROID) 88 MCG tablet, TAKE 1 TABLET(88 MCG) BY MOUTH DAILY BEFORE BREAKFAST, Disp: 45 tablet, Rfl: 2 .  metolazone (ZAROXOLYN) 2.5 MG tablet, Take 1 tablet by mouth every Tuesday, Disp: 12 tablet, Rfl: 3 .  midodrine (PROAMATINE) 10 MG tablet, Take 1 tablet (10 mg total) by mouth 3 (three) times daily with meals., Disp: 270 tablet, Rfl: 3 .  mirtazapine (REMERON) 30 MG tablet, TAKE 1 TABLET BY MOUTH EVERY NIGHT AT BEDTIME, Disp: 30 tablet, Rfl: 3 .  montelukast (SINGULAIR) 10 MG tablet, Take 10 mg by mouth at bedtime. ,  Disp: , Rfl: 1 .  ondansetron (ZOFRAN) 8 MG tablet, , Disp: , Rfl:  .  potassium chloride SA (KLOR-CON) 20 MEQ tablet, Take 2 tablets (40 mEq total) by mouth 3 (three) times daily. Take an extra 21meq(2 tablets) on the days you take the metolazone., Disp: 300 tablet, Rfl: 3 .  probenecid (BENEMID) 500 MG tablet, Take 500 mg by mouth 2 (two) times daily., Disp: , Rfl:  .  simvastatin (ZOCOR) 10 MG tablet, Take 10 mg by mouth every evening. , Disp: , Rfl:  .  spironolactone (ALDACTONE) 25 MG tablet, Take 1 tablet (25 mg total) by mouth every evening., Disp: 90 tablet, Rfl: 3 .  torsemide (DEMADEX) 20 MG tablet, Take 3  tablets (60 mg total) by mouth 2 (two) times daily., Disp: 540 tablet, Rfl: 3 Allergies  Allergen Reactions  . Ceftin Anaphylaxis    Face and throat swell   . Geodon [Ziprasidone Hcl] Hives  . Lisinopril Other (See Comments)    angioedema  . Shellfish Allergy Other (See Comments)    Gout exacerbation  . Allopurinol Nausea Only and Other (See Comments)    weakness  . Ativan [Lorazepam] Itching  . Sulfa Antibiotics Itching  . Ultram [Tramadol Hcl] Itching  . Valium [Diazepam] Other (See Comments)    Patient states that diazepam doesn't relax, it has the opposite effect.     Social History   Socioeconomic History  . Marital status: Married    Spouse name: Not on file  . Number of children: 2  . Years of education: 58  . Highest education level: Master's degree (e.g., MA, MS, MEng, MEd, MSW, MBA)  Occupational History  . Occupation: retired  Tobacco Use  . Smoking status: Former Smoker    Packs/day: 0.10    Years: 26.00    Pack years: 2.60    Types: Cigarettes    Quit date: 05/06/2007    Years since quitting: 12.7  . Smokeless tobacco: Never Used  Vaping Use  . Vaping Use: Never used  Substance and Sexual Activity  . Alcohol use: No  . Drug use: No  . Sexual activity: Yes  Other Topics Concern  . 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 Difficulty of Paying Living Expenses: Not on file  Food Insecurity:   . Worried About Charity fundraiser in the Last Year: Not on file  . Ran Out of Food in the Last Year: Not on file  Transportation Needs:   . Lack of Transportation (Medical): Not on file   . Lack of Transportation (Non-Medical): Not on file  Physical Activity:   . Days of Exercise per Week: Not on file  . Minutes of Exercise per Session: Not on file  Stress:   . Feeling of Stress : Not on file  Social Connections:   . Frequency of Communication with Friends and Family: Not on file  . Frequency of Social Gatherings with Friends and Family: Not on file  . Attends Religious Services: Not  on file  . Active Member of Clubs or Organizations: Not on file  . Attends Archivist Meetings: Not on file  . Marital Status: Not on file  Intimate Partner Violence:   . Fear of Current or Ex-Partner: Not on file  . Emotionally Abused: Not on file  . Physically Abused: Not on file  . Sexually Abused: Not on file    Physical Exam Vitals reviewed.  HENT:     Head: Normocephalic.     Nose: Nose normal.     Mouth/Throat:     Mouth: Mucous membranes are moist.  Eyes:     Pupils: Pupils are equal, round, and reactive to light.  Cardiovascular:     Rate and Rhythm: Normal rate and regular rhythm.     Pulses: Normal pulses.     Heart sounds: Normal heart sounds.  Pulmonary:     Effort: Pulmonary effort is normal.     Breath sounds: Normal breath sounds.  Abdominal:     General: Abdomen is flat.     Palpations: Abdomen is soft.  Musculoskeletal:        General: Swelling present. Normal range of motion.     Cervical back: Normal range of motion.     Right lower leg: Edema present.     Left lower leg: Edema present.  Skin:    General: Skin is warm and dry.     Capillary Refill: Capillary refill takes less than 2 seconds.  Neurological:     General: No focal deficit present.     Mental Status: She is alert. Mental status is at baseline.  Psychiatric:        Mood and Affect: Mood normal.       Arrived for home visit for Mrs. Dauzat who was ambulating around her living room with complaints of general anxiety and leg pain. Legs were bilaterally swollen with same area  leaking pus on right calf. I advised Mrs. Reffitt she needs to assess her eating habits as well as her fluid intake causing her legs to continue to stay swollen making wound harder to heal. She verbalized understanding. Meds were verified and confirmed. Young missed her metolazone dose this morning so I assisted with same. Lianny's vitals were as noted. [name] agreed with plan and home visit in one week. Home visit complete.   Refills: Clonazepam Gabapentin      Future Appointments  Date Time Provider [place]  02/29/2020  8:50 AM Narda Amber K, DO [facility] None  03/14/2020 11:00 AM [facility] PA/NP [facility] None  04/04/2020  3:45 PM Landis Martins, DPM [facility]  04/17/2020  7:30 AM [facility] DEVICE REMOTES [facility]  06/12/2020 10:00 AM Philemon Kingdom, MD [facility] None  06/17/2020  7:30 AM [facility] DEVICE REMOTES [facility]  07/17/2020  7:30 AM [facility] DEVICE REMOTES [facility]     ACTION: Home visit completed Next visit planned for one week

## 2020-02-08 ENCOUNTER — Ambulatory Visit (INDEPENDENT_AMBULATORY_CARE_PROVIDER_SITE_OTHER): Payer: Medicare HMO | Admitting: *Deleted

## 2020-02-08 DIAGNOSIS — I428 Other cardiomyopathies: Secondary | ICD-10-CM | POA: Diagnosis not present

## 2020-02-08 LAB — CUP PACEART REMOTE DEVICE CHECK
Battery Remaining Longevity: 3 mo
Battery Voltage: 2.76 V
Brady Statistic AP VP Percent: 0.09 %
Brady Statistic AP VS Percent: 0.01 %
Brady Statistic AS VP Percent: 99.89 %
Brady Statistic AS VS Percent: 0.01 %
Brady Statistic RA Percent Paced: 0.11 %
Brady Statistic RV Percent Paced: 99.97 %
Date Time Interrogation Session: 20210916124339
HighPow Impedance: 49 Ohm
Implantable Lead Implant Date: 20141210
Implantable Lead Implant Date: 20141210
Implantable Lead Implant Date: 20141210
Implantable Lead Location: 753858
Implantable Lead Location: 753859
Implantable Lead Location: 753860
Implantable Lead Model: 4396
Implantable Lead Model: 5076
Implantable Lead Model: 6935
Implantable Pulse Generator Implant Date: 20141210
Lead Channel Impedance Value: 323 Ohm
Lead Channel Impedance Value: 323 Ohm
Lead Channel Impedance Value: 399 Ohm
Lead Channel Impedance Value: 456 Ohm
Lead Channel Impedance Value: 532 Ohm
Lead Channel Impedance Value: 570 Ohm
Lead Channel Pacing Threshold Amplitude: 0.625 V
Lead Channel Pacing Threshold Amplitude: 1.125 V
Lead Channel Pacing Threshold Amplitude: 1.125 V
Lead Channel Pacing Threshold Pulse Width: 0.4 ms
Lead Channel Pacing Threshold Pulse Width: 0.4 ms
Lead Channel Pacing Threshold Pulse Width: 0.4 ms
Lead Channel Sensing Intrinsic Amplitude: 16.625 mV
Lead Channel Sensing Intrinsic Amplitude: 16.625 mV
Lead Channel Sensing Intrinsic Amplitude: 2.5 mV
Lead Channel Sensing Intrinsic Amplitude: 2.5 mV
Lead Channel Setting Pacing Amplitude: 2.25 V
Lead Channel Setting Pacing Amplitude: 2.25 V
Lead Channel Setting Pacing Amplitude: 2.5 V
Lead Channel Setting Pacing Pulse Width: 0.4 ms
Lead Channel Setting Pacing Pulse Width: 0.4 ms
Lead Channel Setting Sensing Sensitivity: 0.3 mV

## 2020-02-09 ENCOUNTER — Telehealth: Payer: Self-pay | Admitting: *Deleted

## 2020-02-09 ENCOUNTER — Telehealth: Payer: Self-pay

## 2020-02-09 DIAGNOSIS — L03116 Cellulitis of left lower limb: Secondary | ICD-10-CM | POA: Diagnosis not present

## 2020-02-09 NOTE — Telephone Encounter (Signed)
Remote ICM transmission received.  Attempted call to patient regarding ICM remote transmission and no answer.  No message left.

## 2020-02-09 NOTE — Telephone Encounter (Signed)
Pt called back and spoke with scheduler to cancel 03/29/20 appointment and reschedule to 04/24/20 at 11:15am.  Appointment date/time updated.

## 2020-02-09 NOTE — Progress Notes (Signed)
EPIC Encounter for ICM Monitoring  Patient Name: Rhonda [name] is a 69 y.o. female Date: 02/09/2020 Primary Care Physican: Maurice Small, MD Primary Cardiologist:Bensimhon Electrophysiologist: Santina Evans Pacing:99.9% 9/14/2021Office Weight: 207lbs  Battery ERI approximately85months   Attempted call to patient and unable to reach.  Transmission reviewed.   Optivol thoracic impedancenormal.  Prescribed: Patient participating in paramedicine program to assist with meds.  Torsemide 20 mg3tablets (60 mg total)by mouthtwice a day.  Potassium 20 meq2tablets (40 mEq total) by mouth 3 (three) times daily. Take extra 40 mEq on days you take Metolazone.  Metolazone 2.5mg  1 tablet every Tuesday.  Spironolactone 25 mg take 1 tablet by mouth every evening.  Labs: 11/29/2019 Creatinine 1.41, BUN 31, Potassium 3.1, Sodium 141, GFR 38-45 11/22/2019 Creatinine 1.43, BUN 29, Potassium 3.0, Sodium 140, GFR 38-44  A complete set of results can be found in Results Review.  Recommendations:Unable to reach.    Follow-up plan: ICM clinic phone appointment on10/18/2021. 91 day device clinic remote transmission12/16/2021.   EP/Cardiology Office Visits: 03/14/2020 with [facility] PA/NP.  04/24/2020 with Dr Lovena Le for nearing ERI and yearly   Copy of ICM check sent to Dr. Lovena Le.    3 month ICM trend: 02/08/2020    1 Year ICM trend:       [name], RN 02/09/2020 10:43 AM

## 2020-02-09 NOTE — Telephone Encounter (Signed)
Scheduled transmission from 02/08/20 reveals ICD has 3 months until ERI.  Spoke with pt to advise of findings.  Scheduled for monthly battery check transmissions, next on 03/11/20.  Scheduled for overdue f/u with Dr. Lovena Le on 03/29/20.  Pt reports she wrote down both appointments.  Pt has upcoming back surgery planned.  She asks if she will need clearance from Dr. Lovena Le.  Explained process for peri-op device management.  Encouraged to discuss possible need for cardiac clearance with back surgeon at upcoming appointment.  Pt verbalizes understanding and denies questions at this time.

## 2020-02-12 ENCOUNTER — Other Ambulatory Visit: Payer: Self-pay | Admitting: Internal Medicine

## 2020-02-12 DIAGNOSIS — R030 Elevated blood-pressure reading, without diagnosis of hypertension: Secondary | ICD-10-CM | POA: Diagnosis not present

## 2020-02-12 DIAGNOSIS — M48061 Spinal stenosis, lumbar region without neurogenic claudication: Secondary | ICD-10-CM | POA: Diagnosis not present

## 2020-02-12 DIAGNOSIS — Z6841 Body Mass Index (BMI) 40.0 and over, adult: Secondary | ICD-10-CM | POA: Diagnosis not present

## 2020-02-12 NOTE — Progress Notes (Signed)
Remote ICD transmission.   

## 2020-02-13 ENCOUNTER — Other Ambulatory Visit (HOSPITAL_COMMUNITY): Payer: Self-pay

## 2020-02-13 NOTE — Progress Notes (Signed)
Paramedicine Encounter    Patient ID: Rhonda [name], female    DOB: 1951/04/04, 69 y.o.   MRN: [id number]   Patient Care Team: Maurice Small, MD as PCP - General (Family Medicine) [name], LCSW as Social Worker (Licensed Clinical Social Worker) [name], [name], DO as Consulting Physician (Neurology)  Patient Active Problem List   Diagnosis Date Noted  . Diabetes mellitus ([place]) 06/15/2019  . Hypokalemia 11/08/2018  . CKD (chronic kidney disease) stage 3, GFR 30-59 ml/min 11/08/2018  . Orthostatic hypotension 07/28/2017  . SVD (spontaneous vaginal delivery)   . Peripheral neuropathy   . On home oxygen therapy   . Migraines   . Left bundle branch block   . Hypothyroidism   . Hypertension   . Hyperlipidemia   . Heart murmur   . GERD (gastroesophageal reflux disease)   . Exertional shortness of breath   . Major depressive disorder   . Back pain   . Arthritis   . Generalized anxiety disorder   . Anemia   . Chronic respiratory failure ([place]) 09/14/2013  . 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 Morbid obesity ([place]) 11/01/2012  . Chronic systolic heart failure ([place]) 10/27/2012  . Endometrial polyp 01/20/2012  . Malignant tumor of breast ([place]) 03/26/2011  . Unspecified vitamin D deficiency 03/26/2011    Current Outpatient Medications:  .  acetaZOLAMIDE (DIAMOX) 125 MG tablet, TAKE 1 TABLET(125 MG) BY MOUTH DAILY, Disp: 90 tablet, Rfl: 3 .  carboxymethylcellulose (REFRESH PLUS) 0.5 % SOLN, Place 1 drop into both eyes 3 (three) times daily as needed (dry eyes)., Disp: , Rfl:  .  carvedilol (COREG) 3.125 MG tablet, TAKE 1 TABLET(3.125 MG) BY MOUTH TWICE DAILY WITH A MEAL, Disp: 180 tablet, Rfl: 3 .  citalopram (CELEXA) 20 MG tablet, Take 20 mg by mouth daily., Disp: , Rfl:  .  clonazePAM (KLONOPIN) 0.5 MG tablet, Take 0.5 mg by mouth 3 (three) times daily as needed for anxiety. , Disp: , Rfl:  .  DULoxetine (CYMBALTA) 30 MG capsule, Take  30 mg by mouth daily. Take once in the morning., Disp: , Rfl:  .  fexofenadine (ALLEGRA) 180 MG tablet, Take 180 mg by mouth 2 (two) times daily. , Disp: , Rfl:  .  fludrocortisone (FLORINEF) 0.1 MG tablet, TAKE 1 TABLET(0.1 MG) BY MOUTH TWICE DAILY, Disp: 60 tablet, Rfl: 3 .  gabapentin (NEURONTIN) 300 MG capsule, Take 300 mg by mouth 3 (three) times daily. , Disp: , Rfl:  .  gabapentin (NEURONTIN) 800 MG tablet, Take 1 tablet (800 mg total) by mouth at bedtime as needed (neuropathy)., Disp: 90 tablet, Rfl: 1 .  HYDROcodone-acetaminophen (NORCO) 10-325 MG tablet, Take 1 tablet by mouth every 6 (six) hours as needed., Disp: , Rfl:  .  ipratropium (ATROVENT) 0.03 % nasal spray, Place 2 sprays into both nostrils 2 (two) times daily., Disp: , Rfl:  .  levothyroxine (SYNTHROID) 88 MCG tablet, TAKE 1 TABLET(88 MCG) BY MOUTH DAILY BEFORE BREAKFAST, Disp: 90 tablet, Rfl: 1 .  metolazone (ZAROXOLYN) 2.5 MG tablet, Take 1 tablet by mouth every Tuesday, Disp: 12 tablet, Rfl: 3 .  midodrine (PROAMATINE) 10 MG tablet, Take 1 tablet (10 mg total) by mouth 3 (three) times daily with meals., Disp: 270 tablet, Rfl: 3 .  mirtazapine (REMERON) 30 MG tablet, TAKE 1 TABLET BY MOUTH EVERY NIGHT AT BEDTIME, Disp: 30 tablet, Rfl: 3 .  montelukast (SINGULAIR) 10 MG tablet, Take 10 mg by mouth at bedtime. ,  Disp: , Rfl: 1 .  ondansetron (ZOFRAN) 8 MG tablet, , Disp: , Rfl:  .  potassium chloride SA (KLOR-CON) 20 MEQ tablet, Take 2 tablets (40 mEq total) by mouth 3 (three) times daily. Take an extra 19meq(2 tablets) on the days you take the metolazone., Disp: 300 tablet, Rfl: 3 .  probenecid (BENEMID) 500 MG tablet, Take 500 mg by mouth 2 (two) times daily., Disp: , Rfl:  .  simvastatin (ZOCOR) 10 MG tablet, Take 10 mg by mouth every evening. , Disp: , Rfl:  .  spironolactone (ALDACTONE) 25 MG tablet, Take 1 tablet (25 mg total) by mouth every evening., Disp: 90 tablet, Rfl: 3 .  torsemide (DEMADEX) 20 MG tablet, Take 3  tablets (60 mg total) by mouth 2 (two) times daily., Disp: 540 tablet, Rfl: 3 Allergies  Allergen Reactions  . Ceftin Anaphylaxis    Face and throat swell   . Geodon [Ziprasidone Hcl] Hives  . Lisinopril Other (See Comments)    angioedema  . Shellfish Allergy Other (See Comments)    Gout exacerbation  . Allopurinol Nausea Only and Other (See Comments)    weakness  . Ativan [Lorazepam] Itching  . Sulfa Antibiotics Itching  . Ultram [Tramadol Hcl] Itching  . Valium [Diazepam] Other (See Comments)    Patient states that diazepam doesn't relax, it has the opposite effect.     Social History   Socioeconomic History  . Marital status: Married    Spouse name: Not on file  . Number of children: 2  . Years of education: 27  . Highest education level: Master's degree (e.g., MA, MS, MEng, MEd, MSW, MBA)  Occupational History  . Occupation: retired  Tobacco Use  . Smoking status: Former Smoker    Packs/day: 0.10    Years: 26.00    Pack years: 2.60    Types: Cigarettes    Quit date: 05/06/2007    Years since quitting: 12.7  . Smokeless tobacco: Never Used  Vaping Use  . Vaping Use: Never used  Substance and Sexual Activity  . Alcohol use: No  . Drug use: No  . Sexual activity: Yes  Other Topics Concern  . 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 Difficulty of Paying Living Expenses: Not on file  Food Insecurity:   . Worried About Charity fundraiser in the Last Year: Not on file  . Ran Out of Food in the Last Year: Not on file  Transportation Needs:   . Lack of Transportation (Medical): Not on file   . Lack of Transportation (Non-Medical): Not on file  Physical Activity:   . Days of Exercise per Week: Not on file  . Minutes of Exercise per Session: Not on file  Stress:   . Feeling of Stress : Not on file  Social Connections:   . Frequency of Communication with Friends and Family: Not on file  . Frequency of Social Gatherings with Friends and Family: Not on file  . Attends Religious Services: Not  on file  . Active Member of Clubs or Organizations: Not on file  . Attends Archivist Meetings: Not on file  . Marital Status: Not on file  Intimate Partner Violence:   . Fear of Current or Ex-Partner: Not on file  . Emotionally Abused: Not on file  . Physically Abused: Not on file  . Sexually Abused: Not on file    Physical Exam Vitals reviewed.  Constitutional:      Appearance: She is normal weight.  HENT:     Head: Normocephalic.     Nose: Nose normal.     Mouth/Throat:     Mouth: Mucous membranes are moist.     Pharynx: Oropharynx is clear.  Eyes:     Pupils: Pupils are equal, round, and reactive to light.  Cardiovascular:     Rate and Rhythm: Normal rate and regular rhythm.     Pulses: Normal pulses.     Heart sounds: Normal heart sounds.  Pulmonary:     Effort: Pulmonary effort is normal.     Breath sounds: Normal breath sounds.  Abdominal:     General: Abdomen is flat.     Palpations: Abdomen is soft.  Musculoskeletal:        General: Swelling present. Normal range of motion.     Cervical back: Normal range of motion.     Right lower leg: Edema present.     Left lower leg: Edema present.     Comments: Would to back of left leg.   Skin:    General: Skin is warm and dry.     Capillary Refill: Capillary refill takes less than 2 seconds.     Comments: Wound to back of left leg.  Neurological:     Mental Status: She is alert. Mental status is at baseline.  Psychiatric:        Mood and Affect: Mood normal.      Arrived for home visit for Rhonda [name] who  was alert and oriented reporting she was having some pain to the wound on the back of her leg about 2inches in diameter with redness, yellow discharge noted. Patient was seen by PCP on the 17th and was started on Doxycycline 100mg  BID for 7 days. Patient has been taking same. I will continue to assist with same. Vitals were obtained. Patient goes to see cardiology tomorrow. Medications were reviewed and confirmed. Pill box filled accordingly. Rhonda [name] was advised if wound did not improve to call PCP back. Patient agreed. Home visit complete. I will see patient in one week.   Refills- NONE   Future Appointments  Date Time Provider [place]  02/14/2020 12:00 PM Evans Lance, MD [facility]  02/29/2020  8:50 AM Narda Amber K, DO [facility] None  03/11/2020 11:20 AM [facility] DEVICE REMOTES [facility]  03/14/2020 11:00 AM [facility] PA/NP [facility] None  04/04/2020  3:45 PM Landis Martins, DPM [facility]  04/11/2020 11:20 AM [facility] DEVICE REMOTES [facility]  05/09/2020  5:00 PM [facility] DEVICE REMOTES [facility]  06/12/2020 10:00 AM Philemon Kingdom, MD [facility] None  08/08/2020  5:00 PM [facility] DEVICE REMOTES [facility]  11/07/2020  5:00 PM [facility] DEVICE REMOTES [facility]  02/06/2021  5:00 PM [facility] DEVICE REMOTES [facility]  05/08/2021  5:00 PM [facility] DEVICE REMOTES [facility]  06/09/2021  5:00 PM [facility] DEVICE REMOTES [facility]  07/09/2021  5:00 PM [facility] DEVICE REMOTES [facility]     ACTION: Home visit completed Next visit planned  for one week

## 2020-02-14 ENCOUNTER — Encounter: Payer: Self-pay | Admitting: Internal Medicine

## 2020-02-14 ENCOUNTER — Ambulatory Visit: Payer: Medicare HMO | Admitting: Internal Medicine

## 2020-02-14 ENCOUNTER — Other Ambulatory Visit: Payer: Self-pay

## 2020-02-14 VITALS — BP 122/68 | HR 77 | Ht 61.0 in | Wt 210.4 lb

## 2020-02-14 DIAGNOSIS — I5022 Chronic systolic (congestive) heart failure: Secondary | ICD-10-CM | POA: Diagnosis not present

## 2020-02-14 DIAGNOSIS — I447 Left bundle-branch block, unspecified: Secondary | ICD-10-CM | POA: Diagnosis not present

## 2020-02-14 DIAGNOSIS — Z95810 Presence of automatic (implantable) cardiac defibrillator: Secondary | ICD-10-CM

## 2020-02-14 LAB — CUP PACEART INCLINIC DEVICE CHECK
Battery Remaining Longevity: 2 mo
Battery Voltage: 2.75 V
Brady Statistic AP VP Percent: 0.26 %
Brady Statistic AP VS Percent: 0.01 %
Brady Statistic AS VP Percent: 99.71 %
Brady Statistic AS VS Percent: 0.02 %
Brady Statistic RA Percent Paced: 0.27 %
Brady Statistic RV Percent Paced: 99.95 %
Date Time Interrogation Session: 20210922123838
HighPow Impedance: 53 Ohm
Implantable Lead Implant Date: 20141210
Implantable Lead Implant Date: 20141210
Implantable Lead Implant Date: 20141210
Implantable Lead Location: 753858
Implantable Lead Location: 753859
Implantable Lead Location: 753860
Implantable Lead Model: 4396
Implantable Lead Model: 5076
Implantable Lead Model: 6935
Implantable Pulse Generator Implant Date: 20141210
Lead Channel Impedance Value: 342 Ohm
Lead Channel Impedance Value: 342 Ohm
Lead Channel Impedance Value: 437 Ohm
Lead Channel Impedance Value: 532 Ohm
Lead Channel Impedance Value: 589 Ohm
Lead Channel Impedance Value: 627 Ohm
Lead Channel Pacing Threshold Amplitude: 0.5 V
Lead Channel Pacing Threshold Amplitude: 1 V
Lead Channel Pacing Threshold Amplitude: 1 V
Lead Channel Pacing Threshold Pulse Width: 0.4 ms
Lead Channel Pacing Threshold Pulse Width: 0.4 ms
Lead Channel Pacing Threshold Pulse Width: 0.4 ms
Lead Channel Sensing Intrinsic Amplitude: 2.125 mV
Lead Channel Sensing Intrinsic Amplitude: 2.375 mV
Lead Channel Sensing Intrinsic Amplitude: 23.25 mV
Lead Channel Sensing Intrinsic Amplitude: 23.5 mV
Lead Channel Setting Pacing Amplitude: 2 V
Lead Channel Setting Pacing Amplitude: 2 V
Lead Channel Setting Pacing Amplitude: 2.5 V
Lead Channel Setting Pacing Pulse Width: 0.4 ms
Lead Channel Setting Pacing Pulse Width: 0.4 ms
Lead Channel Setting Sensing Sensitivity: 0.3 mV

## 2020-02-14 NOTE — Patient Instructions (Addendum)
Medication Instructions:  Your physician recommends that you continue on your current medications as directed. Please refer to the Current Medication list given to you today.  Labwork: None ordered.  Testing/Procedures: None ordered.  Follow-Up:  SEE INSTRUCTION LETTER  Any Other Special Instructions Will Be Listed Below (If Applicable).  If you need a refill on your cardiac medications before your next appointment, please call your pharmacy.    ICD Battery Change  A pacemaker battery usually lasts 5-15 years (6-7 years on average). A few times a year, you will be asked to visit your health care provider to have a full evaluation of your pacemaker. When the battery is low, your pacemaker battery and generator will be completely replaced. Most often, this procedure is simpler than the first surgery because the wires (leads) that connect the generator to the heart are already in place. There are many things that affect how long a pacemaker battery will last, including:  The age of the pacemaker.  The number of leads you have(1, 2, or 3).  The pacemaker workload. If the pacemaker is helping the heart more often, the battery will not last as long.  Power (voltage) settings. Tell a health care provider about:  Any allergies you have.  All medicines you are taking, including vitamins, herbs, eye drops, creams, and over-the-counter medicines.  Any problems you or family members have had with anesthetic medicines.  Any blood disorders you have.  Any surgeries you have had, especially the surgeries you have had since your last pacemaker was placed.  Any medical conditions you have.  Whether you are pregnant or may be pregnant.  Any symptoms of heart problems, such as chest pain, trouble breathing, palpitations, light-headedness, or feelings of an abnormal or irregular heartbeat.  Smoking habits. This can affect your reaction to anesthesia. What are the risks? Generally, this  is a safe procedure. However, problems may occur, including:  Bleeding.  Bruising of the skin around where the surgical cut (incision) was made.  Pulling apart of the skin at the incision site.  Infection.  Nerve damage.  Injury to other organs, such as the lungs.  Allergic reaction to anesthetics or other medicines used during the procedure.  People with diabetes may have a temporary increase in blood sugar (glucose) after any surgical procedure. What happens before the procedure? Staying hydrated Follow instructions from your health care provider about hydration, which may include:  Up to 2 hours before the procedure - you may continue to drink clear liquids, such as water, clear fruit juice, black coffee, and plain tea. Eating and drinking restrictions Follow instructions from your health care provider about eating and drinking restrictions, which may include:  8 hours before the procedure - stop eating heavy meals or foods such as meat, fried foods, or fatty foods.  6 hours before the procedure - stop eating light meals or foods, such as toast or cereal.  6 hours before the procedure - stop drinking milk or drinks that contain milk.  2 hours before the procedure - stop drinking clear liquids. General instructions  Ask your health care provider about: ? Changing or stopping your regular medicines. This is especially important if you are taking diabetes medicines or blood thinners. ? Taking medicines such as aspirin and ibuprofen. These medicines can thin your blood. Do not take these medicines before your procedure if your health care provider instructs you not to. ? Taking a sip of water with any approved medicines on the morning of  the procedure.  Plan to have someone take you home after the procedure. What happens during the procedure?  To reduce your risk of infection: ? Your health care team will wash or sanitize their hands. ? The skin around the area of the chest  will be washed with soap. ? Hair may be removed from the surgical area.  An IV tube will be inserted into one your veins to give you medicine and fluids.  You will be given one or more of the following: ? A medicine to help you relax (sedative). ? A medicine to numb the area where the pacemaker is located (local anesthetic).  You may be given antibiotic medicine to prevent infection.  Your health care provider will make an incision to reopen the pocket holding the pacemaker.  The old pacemaker will be disconnected from the leads.  The leads will be tested.  If needed, the leads will be replaced. If the leads are functioning properly, the new pacemaker will be connected to the existing leads.  A heart monitor and the pacemaker programmer will be used to make sure that the newly implanted pacemaker is working properly.  The incision site will be closed. A bandage (dressing) will be placed over the pacemaker site. The procedure may vary among health care providers and hospitals. What happens after the procedure?  Your blood pressure, heart rate, breathing rate, and blood oxygen level will be monitored until your health care team is satisfied that your pacemaker is working properly.  Your health care provider will tell you when your pacemaker will need to be tested again, or when to return to the office for removal of dressing and stitches.  Do not drive for 24 hours if you were given a sedative.  The dressing will be removed 24-48 hours after the procedure, or as told by your health care provider. Summary  A pacemaker battery usually lasts 5-15 years (6-7 years on average).  When the battery is low, your pacemaker battery and generator will be completely replaced.  Risks of this procedure include bleeding, bruising, infection, damage to other structures, pulling apart of the skin at the incision site, and allergic reactions to medicines or anesthetics.  Most often, this procedure  is simpler than the first surgery because the wires (leads) that connect the generator to the heart are already in place. This information is not intended to replace advice given to you by your health care provider. Make sure you discuss any questions you have with your health care provider. Document Revised: 04/23/2017 Document Reviewed: 04/14/2016 Elsevier Patient Education  2020 [employer].

## 2020-02-14 NOTE — Progress Notes (Signed)
HPI Rhonda [name] returns today for ongoing evaluation and management of chronic systolic heart failure status post ICD insertion.  She is a very pleasant 69 year old woman with a long history of congestive heart failure, status post biventricular ICD insertion.  She has class II heart failure symptoms.  She has morbid obesity and has been unable to lose weight.  Unfortunately she has gained over 30 pounds in the last year.  She is approaching elective replacement on her device.  She has had no ICD therapies.  She admits to some dietary indiscretion. Allergies  Allergen Reactions  . Ceftin Anaphylaxis    Face and throat swell   . Geodon [Ziprasidone Hcl] Hives  . Lisinopril Other (See Comments)    angioedema  . Shellfish Allergy Other (See Comments)    Gout exacerbation  . Allopurinol Nausea Only and Other (See Comments)    weakness  . Ativan [Lorazepam] Itching  . Sulfa Antibiotics Itching  . Ultram [Tramadol Hcl] Itching  . Valium [Diazepam] Other (See Comments)    Patient states that diazepam doesn't relax, it has the opposite effect.     Current Outpatient Medications  Medication Sig Dispense Refill  . acetaZOLAMIDE (DIAMOX) 125 MG tablet TAKE 1 TABLET(125 MG) BY MOUTH DAILY 90 tablet 3  . carboxymethylcellulose (REFRESH PLUS) 0.5 % SOLN Place 1 drop into both eyes 3 (three) times daily as needed (dry eyes).    . carvedilol (COREG) 3.125 MG tablet TAKE 1 TABLET(3.125 MG) BY MOUTH TWICE DAILY WITH A MEAL 180 tablet 3  . citalopram (CELEXA) 20 MG tablet Take 20 mg by mouth daily.    . clonazePAM (KLONOPIN) 0.5 MG tablet Take 0.5 mg by mouth 3 (three) times daily as needed for anxiety.     . DULoxetine (CYMBALTA) 30 MG capsule Take 30 mg by mouth daily. Take once in the morning.    . fexofenadine (ALLEGRA) 180 MG tablet Take 180 mg by mouth 2 (two) times daily.     . fludrocortisone (FLORINEF) 0.1 MG tablet TAKE 1 TABLET(0.1 MG) BY MOUTH TWICE DAILY 60 tablet 3  . gabapentin  (NEURONTIN) 300 MG capsule Take 300 mg by mouth 3 (three) times daily.     Marland Kitchen gabapentin (NEURONTIN) 800 MG tablet Take 1 tablet (800 mg total) by mouth at bedtime as needed (neuropathy). 90 tablet 1  . HYDROcodone-acetaminophen (NORCO) 10-325 MG tablet Take 1 tablet by mouth every 6 (six) hours as needed.    Marland Kitchen ipratropium (ATROVENT) 0.03 % nasal spray Place 2 sprays into both nostrils 2 (two) times daily.    Marland Kitchen levothyroxine (SYNTHROID) 88 MCG tablet TAKE 1 TABLET(88 MCG) BY MOUTH DAILY BEFORE BREAKFAST 90 tablet 1  . metolazone (ZAROXOLYN) 2.5 MG tablet Take 1 tablet by mouth every Tuesday 12 tablet 3  . midodrine (PROAMATINE) 10 MG tablet Take 1 tablet (10 mg total) by mouth 3 (three) times daily with meals. 270 tablet 3  . mirtazapine (REMERON) 30 MG tablet TAKE 1 TABLET BY MOUTH EVERY NIGHT AT BEDTIME 30 tablet 3  . montelukast (SINGULAIR) 10 MG tablet Take 10 mg by mouth at bedtime.   1  . ondansetron (ZOFRAN) 8 MG tablet     . potassium chloride SA (KLOR-CON) 20 MEQ tablet Take 2 tablets (40 mEq total) by mouth 3 (three) times daily. Take an extra 17meq(2 tablets) on the days you take the metolazone. 300 tablet 3  . probenecid (BENEMID) 500 MG tablet Take 500 mg by mouth 2 (  two) times daily.    . simvastatin (ZOCOR) 10 MG tablet Take 10 mg by mouth every evening.     Marland Kitchen spironolactone (ALDACTONE) 25 MG tablet Take 1 tablet (25 mg total) by mouth every evening. 90 tablet 3  . torsemide (DEMADEX) 20 MG tablet Take 3 tablets (60 mg total) by mouth 2 (two) times daily. 540 tablet 3   No current facility-administered medications for this visit.     Past Medical History:  Diagnosis Date  . Anemia   . Arthritis    Right knee  . Asthma   . Back pain    Disk problem  . Breast cancer, stage 1 ([name]) 03/26/2011   Left; completed chemotherapy and radiation treatments  . Cardiomyopathy   . Chronic respiratory failure ([place]) 09/14/2013  . Chronic systolic heart failure ([facility])    a) NICM b) ECHO  (03/2013) EF 20-25% c) ECHO (09/2013) EF 45-50%, grade I DD  . CKD (chronic kidney disease) stage 3, GFR 30-59 ml/min 11/08/2018  . Complication of anesthesia    History of low blood pressure after surgery; attributed to lying flat  . Diabetes mellitus    "diet controlled" (05/03/2013)  . Exertional shortness of breath   . Generalized anxiety disorder   . GERD (gastroesophageal reflux disease)   . Gout   . Heart murmur   . Hyperlipidemia   . Hypertension   . Hypokalemia 11/08/2018  . Hypothyroidism   . Left bundle branch block    s/p CRT-D (04/2013)  . Major depressive disorder   . Migraines   . On home oxygen therapy    "2L suppose to be q night" (05/03/2013)  . Orthostatic hypotension 07/28/2017  . Peripheral neuropathy    Feet  . SVD (spontaneous vaginal delivery)    x 2  . Unspecified vitamin D deficiency 03/26/2011   Does not take meds    ROS:   All systems reviewed and negative except as noted in the HPI.   Past Surgical History:  Procedure Laterality Date  . BI-VENTRICULAR IMPLANTABLE CARDIOVERTER DEFIBRILLATOR N/A 05/03/2013   Procedure: BI-VENTRICULAR IMPLANTABLE CARDIOVERTER DEFIBRILLATOR  (CRT-D);  Surgeon: Evans Lance, MD;  Location: [facility];  Service: Cardiovascular;  Laterality: N/A;  . BI-VENTRICULAR IMPLANTABLE CARDIOVERTER DEFIBRILLATOR  (CRT-D)  05/03/2013   MDT CRTD implanted by Dr Lovena Le for non ischemic cardiomyopathy  . BIOPSY  05/31/2018   Procedure: BIOPSY;  Surgeon: Ronnette Juniper, MD;  Location: [facility];  Service: Gastroenterology;;  . BREAST LUMPECTOMY Left 07/28/2010  . CARDIAC CATHETERIZATION  2010   NORMAL CORONARY ARTERIES  . 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--11 SURGERIES     PT HAS REMOVABLE SPEECH BULB-TAKES IT OUT BEFORE HER SURGERY  . COLONOSCOPY    . COLONOSCOPY N/A 05/31/2018   Procedure: COLONOSCOPY;  Surgeon: Ronnette Juniper, MD;  Location: [facility];  Service: Gastroenterology;  Laterality: N/A;  . HYSTEROSCOPY WITH D & C   01/20/2012   Procedure: DILATATION AND CURETTAGE /HYSTEROSCOPY;  Surgeon: Margarette Asal, MD;  Location: [facility];  Service: Gynecology;  Laterality: N/A;  with trueclear  . PORT-A-CATH REMOVAL  09/23/2011   Procedure: REMOVAL PORT-A-CATH;  Surgeon: Rolm Bookbinder, MD;  Location: [facility];  Service: General;  Laterality: N/A;  Port Removal  . PORTACATH PLACEMENT  2012  . TONSILLECTOMY  1960's  . TOTAL KNEE ARTHROPLASTY  05/10/2012   Procedure: TOTAL KNEE ARTHROPLASTY;  Surgeon: Mauri Pole, MD;  Location: [facility];  Service: Orthopedics;  Laterality: Right;  .  WISDOM TOOTH EXTRACTION       Family History  Problem Relation Age of Onset  . Alzheimer's disease Mother   . CVA Mother   . Hypertension Mother   . Cancer - Colon Father        late 49  . Birth defects Paternal Uncle      Social History   Socioeconomic History  . Marital status: Married    Spouse name: Not on file  . Number of children: 2  . Years of education: 59  . Highest education level: Master's degree (e.g., MA, MS, MEng, MEd, MSW, MBA)  Occupational History  . Occupation: retired  Tobacco Use  . Smoking status: Former Smoker    Packs/day: 0.10    Years: 26.00    Pack years: 2.60    Types: Cigarettes    Quit date: 05/06/2007    Years since quitting: 12.7  . Smokeless tobacco: Never Used  Vaping Use  . Vaping Use: Never used  Substance and Sexual Activity  . Alcohol use: No  . Drug use: No  . Sexual activity: Yes  Other Topics Concern  . 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 Difficulty of Paying Living Expenses: Not on file   Food Insecurity:   . Worried About Charity fundraiser in the Last Year: Not on file  . Ran Out of Food in the Last Year: Not on file  Transportation Needs:   . Lack of Transportation (Medical): Not on file  . Lack of Transportation (Non-Medical): Not on file  Physical Activity:   . Days of Exercise per Week: Not on file  . Minutes of Exercise per Session: Not on file  Stress:   . Feeling of Stress : Not on file  Social Connections:   . Frequency of Communication with Friends and Family: Not on file  . Frequency of Social Gatherings with Friends and Family: Not on file  . Attends Religious Services: Not on file  . Active Member of Clubs or Organizations: Not on file  . Attends Archivist Meetings: Not on file  . Marital Status: Not on file  Intimate Partner Violence:   . Fear of Current or Ex-Partner: Not on file  . Emotionally Abused: Not on file  . Physically Abused: Not on file  . Sexually Abused: Not on file     BP 122/68   Pulse 77   Ht 5\' 1"  (1.549 m)   Wt 210 lb 6.4 oz (95.4 kg)   BMI 39.75 kg/m   Physical Exam:  Well appearing NAD HEENT: Unremarkable Neck:  No JVD, no thyromegally Lymphatics:  No adenopathy Back:  No CVA tenderness Lungs:  Clear HEART:  Regular rate rhythm, no murmurs, no rubs, no clicks Abd:  soft, positive bowel sounds, no organomegally, no rebound, no guarding Ext:  2 plus pulses, no edema, no cyanosis, no clubbing Skin:  No rashes no nodules Neuro:  CN II through XII intact, motor grossly intact  EKG -normal sinus  rhythm with biventricular pacing  DEVICE  Normal device function.  See PaceArt for details.  Approaching ERI  Assess/Plan: 1.  Chronic systolic heart failure -she will continue her current medical therapy and maintain a low-sodium diet. 2.  ICD -Medtronic BiV ICD is working normally.  We will plan to recheck after her generator change out. 3.  Obesity -we discussed the importance of weight loss.  We discussed  intermittent fasting. 4.  Venous insufficiency -her symptoms are somewhat improved.  She is encouraged to maintain a low-sodium diet and to use support stockings.  Crissie Sickles, MD

## 2020-02-15 DIAGNOSIS — S81802A Unspecified open wound, left lower leg, initial encounter: Secondary | ICD-10-CM | POA: Diagnosis not present

## 2020-02-20 ENCOUNTER — Other Ambulatory Visit (HOSPITAL_COMMUNITY): Payer: Self-pay

## 2020-02-20 NOTE — Progress Notes (Signed)
Paramedicine Encounter    Patient ID: Rhonda [name], female    DOB: [birth year], 69 y.o.   MRN: [id number]   Patient Care Team: Maurice Small, MD as PCP - General (Family Medicine) [name], LCSW as Social Worker (Licensed Clinical Social Worker) [name], [name], DO as Consulting Physician (Neurology)  Patient Active Problem List   Diagnosis Date Noted  . Diabetes mellitus ([place]) 06/15/2019  . Hypokalemia 11/08/2018  . CKD (chronic kidney disease) stage 3, GFR 30-59 ml/min 11/08/2018  . Orthostatic hypotension 07/28/2017  . SVD (spontaneous vaginal delivery)   . Peripheral neuropathy   . On home oxygen therapy   . Migraines   . Left bundle branch block   . Hypothyroidism   . Hypertension   . Hyperlipidemia   . Heart murmur   . GERD (gastroesophageal reflux disease)   . Exertional shortness of breath   . Major depressive disorder   . Back pain   . Arthritis   . Generalized anxiety disorder   . Anemia   . Chronic respiratory failure ([place]) 09/14/2013  . 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 Morbid obesity ([place]) 11/01/2012  . Chronic systolic heart failure ([place]) 10/27/2012  . Endometrial polyp 01/20/2012  . Malignant tumor of breast ([place]) 03/26/2011  . Unspecified vitamin D deficiency 03/26/2011    Current Outpatient Medications:  .  acetaZOLAMIDE (DIAMOX) 125 MG tablet, TAKE 1 TABLET(125 MG) BY MOUTH DAILY, Disp: 90 tablet, Rfl: 3 .  carboxymethylcellulose (REFRESH PLUS) 0.5 % SOLN, Place 1 drop into both eyes 3 (three) times daily as needed (dry eyes)., Disp: , Rfl:  .  carvedilol (COREG) 3.125 MG tablet, TAKE 1 TABLET(3.125 MG) BY MOUTH TWICE DAILY WITH A MEAL, Disp: 180 tablet, Rfl: 3 .  citalopram (CELEXA) 20 MG tablet, Take 20 mg by mouth daily., Disp: , Rfl:  .  clonazePAM (KLONOPIN) 0.5 MG tablet, Take 0.5 mg by mouth 3 (three) times daily as needed for anxiety. , Disp: , Rfl:  .  DULoxetine (CYMBALTA) 30 MG capsule, Take  30 mg by mouth daily. Take once in the morning., Disp: , Rfl:  .  fexofenadine (ALLEGRA) 180 MG tablet, Take 180 mg by mouth 2 (two) times daily. , Disp: , Rfl:  .  fludrocortisone (FLORINEF) 0.1 MG tablet, TAKE 1 TABLET(0.1 MG) BY MOUTH TWICE DAILY, Disp: 60 tablet, Rfl: 3 .  gabapentin (NEURONTIN) 300 MG capsule, Take 300 mg by mouth 3 (three) times daily. , Disp: , Rfl:  .  gabapentin (NEURONTIN) 800 MG tablet, Take 1 tablet (800 mg total) by mouth at bedtime as needed (neuropathy)., Disp: 90 tablet, Rfl: 1 .  HYDROcodone-acetaminophen (NORCO) 10-325 MG tablet, Take 1 tablet by mouth every 6 (six) hours as needed., Disp: , Rfl:  .  ipratropium (ATROVENT) 0.03 % nasal spray, Place 2 sprays into both nostrils 2 (two) times daily., Disp: , Rfl:  .  levothyroxine (SYNTHROID) 88 MCG tablet, TAKE 1 TABLET(88 MCG) BY MOUTH DAILY BEFORE BREAKFAST, Disp: 90 tablet, Rfl: 1 .  metolazone (ZAROXOLYN) 2.5 MG tablet, Take 1 tablet by mouth every Tuesday, Disp: 12 tablet, Rfl: 3 .  midodrine (PROAMATINE) 10 MG tablet, Take 1 tablet (10 mg total) by mouth 3 (three) times daily with meals., Disp: 270 tablet, Rfl: 3 .  mirtazapine (REMERON) 30 MG tablet, TAKE 1 TABLET BY MOUTH EVERY NIGHT AT BEDTIME, Disp: 30 tablet, Rfl: 3 .  montelukast (SINGULAIR) 10 MG tablet, Take 10 mg by mouth at bedtime. ,  Disp: , Rfl: 1 .  ondansetron (ZOFRAN) 8 MG tablet, , Disp: , Rfl:  .  potassium chloride SA (KLOR-CON) 20 MEQ tablet, Take 2 tablets (40 mEq total) by mouth 3 (three) times daily. Take an extra 42meq(2 tablets) on the days you take the metolazone., Disp: 300 tablet, Rfl: 3 .  probenecid (BENEMID) 500 MG tablet, Take 500 mg by mouth 2 (two) times daily., Disp: , Rfl:  .  simvastatin (ZOCOR) 10 MG tablet, Take 10 mg by mouth every evening. , Disp: , Rfl:  .  spironolactone (ALDACTONE) 25 MG tablet, Take 1 tablet (25 mg total) by mouth every evening., Disp: 90 tablet, Rfl: 3 .  torsemide (DEMADEX) 20 MG tablet, Take 3  tablets (60 mg total) by mouth 2 (two) times daily., Disp: 540 tablet, Rfl: 3 Allergies  Allergen Reactions  . Ceftin Anaphylaxis    Face and throat swell   . Geodon [Ziprasidone Hcl] Hives  . Lisinopril Other (See Comments)    angioedema  . Shellfish Allergy Other (See Comments)    Gout exacerbation  . Allopurinol Nausea Only and Other (See Comments)    weakness  . Ativan [Lorazepam] Itching  . Sulfa Antibiotics Itching  . Ultram [Tramadol Hcl] Itching  . Valium [Diazepam] Other (See Comments)    Patient states that diazepam doesn't relax, it has the opposite effect.     Social History   Socioeconomic History  . Marital status: Married    Spouse name: Not on file  . Number of children: 2  . Years of education: 69  . Highest education level: Master's degree (e.g., MA, MS, MEng, MEd, MSW, MBA)  Occupational History  . Occupation: retired  Tobacco Use  . Smoking status: Former Smoker    Packs/day: 0.10    Years: 26.00    Pack years: 2.60    Types: Cigarettes    Quit date: 05/06/2007    Years since quitting: 12.8  . Smokeless tobacco: Never Used  Vaping Use  . Vaping Use: Never used  Substance and Sexual Activity  . Alcohol use: No  . Drug use: No  . Sexual activity: Yes  Other Topics Concern  . 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 Difficulty of Paying Living Expenses: Not on file  Food Insecurity:   . Worried About Charity fundraiser in the Last Year: Not on file  . Ran Out of Food in the Last Year: Not on file  Transportation Needs:   . Lack of Transportation (Medical): Not on file   . Lack of Transportation (Non-Medical): Not on file  Physical Activity:   . Days of Exercise per Week: Not on file  . Minutes of Exercise per Session: Not on file  Stress:   . Feeling of Stress : Not on file  Social Connections:   . Frequency of Communication with Friends and Family: Not on file  . Frequency of Social Gatherings with Friends and Family: Not on file  . Attends Religious Services: Not  on file  . Active Member of Clubs or Organizations: Not on file  . Attends Archivist Meetings: Not on file  . Marital Status: Not on file  Intimate Partner Violence:   . Fear of Current or Ex-Partner: Not on file  . Emotionally Abused: Not on file  . Physically Abused: Not on file  . Sexually Abused: Not on file    Physical Exam Vitals reviewed.  Constitutional:      Appearance: She is normal weight.  HENT:     Head: Normocephalic.     Nose: Nose normal.     Mouth/Throat:     Mouth: Mucous membranes are moist.     Pharynx: Oropharynx is clear.  Eyes:     Pupils: Pupils are equal, round, and reactive to light.  Cardiovascular:     Rate and Rhythm: Normal rate and regular rhythm.     Pulses: Normal pulses.     Heart sounds: Normal heart sounds.  Pulmonary:     Effort: Pulmonary effort is normal.     Breath sounds: Normal breath sounds.  Abdominal:     Palpations: Abdomen is soft.  Musculoskeletal:        General: Normal range of motion.     Cervical back: Normal range of motion.     Right lower leg: Edema present.     Left lower leg: Edema present.  Skin:    General: Skin is warm and dry.     Capillary Refill: Capillary refill takes less than 2 seconds.  Neurological:     Mental Status: She is alert. Mental status is at baseline.  Psychiatric:        Mood and Affect: Mood normal.     Arrived for home visit for Rhonda [name] today who reports her leg is hurting from the open wound in which she has on the back of her left leg. Doctor advised treating with  neosporin and coban wrap. Vitals were obtained and are as noted. Medications reviewed and as noted. Pill box filled accordingly. Rhonda [name] was reminded of upcoming appointments. Rhonda [name] agreed to visit in one week. Home visit complete.   Refills- Bentyl.    Future Appointments  Date Time Provider [place]  02/29/2020  8:50 AM Narda Amber K, DO [facility] None  03/11/2020 11:20 AM [facility] DEVICE REMOTES [facility]  03/14/2020 11:00 AM [facility] PA/NP [facility] None  04/04/2020  3:45 PM Landis Martins, DPM [facility]  04/11/2020 11:20 AM [facility] DEVICE REMOTES [facility]  04/23/2020  8:00 AM [facility] LAB [facility]  04/23/2020  8:35 AM [facility] None  05/07/2020 12:00 PM [facility] DEVICE 1 [facility]  05/09/2020  5:00 PM [facility] DEVICE REMOTES [facility]  06/12/2020 10:00 AM Philemon Kingdom, MD [facility] None  08/08/2020  5:00 PM [facility] DEVICE REMOTES [facility]  11/07/2020  5:00 PM [facility] DEVICE REMOTES [facility]  02/06/2021  5:00 PM [facility] DEVICE REMOTES [facility]  05/08/2021  5:00 PM [facility] DEVICE REMOTES [facility]  06/09/2021  5:00 PM [facility] DEVICE REMOTES [facility]  07/09/2021  5:00 PM [facility] DEVICE REMOTES [facility]     ACTION: Home visit completed Next visit planned for one week

## 2020-02-22 DIAGNOSIS — Z23 Encounter for immunization: Secondary | ICD-10-CM | POA: Diagnosis not present

## 2020-02-26 DIAGNOSIS — F0632 Mood disorder due to known physiological condition with major depressive-like episode: Secondary | ICD-10-CM | POA: Diagnosis not present

## 2020-02-26 DIAGNOSIS — F3342 Major depressive disorder, recurrent, in full remission: Secondary | ICD-10-CM | POA: Diagnosis not present

## 2020-02-27 ENCOUNTER — Other Ambulatory Visit (HOSPITAL_COMMUNITY): Payer: Self-pay

## 2020-02-27 NOTE — Progress Notes (Signed)
Paramedicine Encounter    Patient ID: Rhonda [name], female    DOB: Oct 17, 1950, 69 y.o.   MRN: [id number]   Patient Care Team: Maurice Small, MD as PCP - General (Family Medicine) [name], LCSW as Social Worker (Licensed Clinical Social Worker) [name], [name], DO as Consulting Physician (Neurology)  Patient Active Problem List   Diagnosis Date Noted  . Diabetes mellitus ([place]) 06/15/2019  . Hypokalemia 11/08/2018  . CKD (chronic kidney disease) stage 3, GFR 30-59 ml/min ([facility]) 11/08/2018  . Orthostatic hypotension 07/28/2017  . SVD (spontaneous vaginal delivery)   . Peripheral neuropathy   . On home oxygen therapy   . Migraines   . Left bundle branch block   . Hypothyroidism   . Hypertension   . Hyperlipidemia   . Heart murmur   . GERD (gastroesophageal reflux disease)   . Exertional shortness of breath   . Major depressive disorder   . Back pain   . Arthritis   . Generalized anxiety disorder   . Anemia   . Chronic respiratory failure ([place]) 09/14/2013  . 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 Morbid obesity ([place]) 11/01/2012  . Chronic systolic heart failure ([place]) 10/27/2012  . Endometrial polyp 01/20/2012  . Malignant tumor of breast ([place]) 03/26/2011  . Unspecified vitamin D deficiency 03/26/2011    Current Outpatient Medications:  .  acetaZOLAMIDE (DIAMOX) 125 MG tablet, TAKE 1 TABLET(125 MG) BY MOUTH DAILY, Disp: 90 tablet, Rfl: 3 .  carboxymethylcellulose (REFRESH PLUS) 0.5 % SOLN, Place 1 drop into both eyes 3 (three) times daily as needed (dry eyes)., Disp: , Rfl:  .  carvedilol (COREG) 3.125 MG tablet, TAKE 1 TABLET(3.125 MG) BY MOUTH TWICE DAILY WITH A MEAL, Disp: 180 tablet, Rfl: 3 .  citalopram (CELEXA) 20 MG tablet, Take 20 mg by mouth daily., Disp: , Rfl:  .  clonazePAM (KLONOPIN) 0.5 MG tablet, Take 0.5 mg by mouth 3 (three) times daily as needed for anxiety. , Disp: , Rfl:  .  DULoxetine (CYMBALTA) 30 MG  capsule, Take 30 mg by mouth daily. Take once in the morning., Disp: , Rfl:  .  fexofenadine (ALLEGRA) 180 MG tablet, Take 180 mg by mouth 2 (two) times daily. , Disp: , Rfl:  .  fludrocortisone (FLORINEF) 0.1 MG tablet, TAKE 1 TABLET(0.1 MG) BY MOUTH TWICE DAILY, Disp: 60 tablet, Rfl: 3 .  gabapentin (NEURONTIN) 300 MG capsule, Take 300 mg by mouth 3 (three) times daily. , Disp: , Rfl:  .  gabapentin (NEURONTIN) 800 MG tablet, Take 1 tablet (800 mg total) by mouth at bedtime as needed (neuropathy)., Disp: 90 tablet, Rfl: 1 .  HYDROcodone-acetaminophen (NORCO) 10-325 MG tablet, Take 1 tablet by mouth every 6 (six) hours as needed., Disp: , Rfl:  .  ipratropium (ATROVENT) 0.03 % nasal spray, Place 2 sprays into both nostrils 2 (two) times daily., Disp: , Rfl:  .  levothyroxine (SYNTHROID) 88 MCG tablet, TAKE 1 TABLET(88 MCG) BY MOUTH DAILY BEFORE BREAKFAST, Disp: 90 tablet, Rfl: 1 .  metolazone (ZAROXOLYN) 2.5 MG tablet, Take 1 tablet by mouth every Tuesday, Disp: 12 tablet, Rfl: 3 .  midodrine (PROAMATINE) 10 MG tablet, Take 1 tablet (10 mg total) by mouth 3 (three) times daily with meals., Disp: 270 tablet, Rfl: 3 .  mirtazapine (REMERON) 30 MG tablet, TAKE 1 TABLET BY MOUTH EVERY NIGHT AT BEDTIME, Disp: 30 tablet, Rfl: 3 .  montelukast (SINGULAIR) 10 MG tablet, Take 10 mg by mouth at  bedtime. , Disp: , Rfl: 1 .  ondansetron (ZOFRAN) 8 MG tablet, , Disp: , Rfl:  .  potassium chloride SA (KLOR-CON) 20 MEQ tablet, Take 2 tablets (40 mEq total) by mouth 3 (three) times daily. Take an extra 7meq(2 tablets) on the days you take the metolazone., Disp: 300 tablet, Rfl: 3 .  probenecid (BENEMID) 500 MG tablet, Take 500 mg by mouth 2 (two) times daily., Disp: , Rfl:  .  simvastatin (ZOCOR) 10 MG tablet, Take 10 mg by mouth every evening. , Disp: , Rfl:  .  spironolactone (ALDACTONE) 25 MG tablet, Take 1 tablet (25 mg total) by mouth every evening., Disp: 90 tablet, Rfl: 3 .  torsemide (DEMADEX) 20 MG  tablet, Take 3 tablets (60 mg total) by mouth 2 (two) times daily., Disp: 540 tablet, Rfl: 3 Allergies  Allergen Reactions  . Ceftin Anaphylaxis    Face and throat swell   . Geodon [Ziprasidone Hcl] Hives  . Lisinopril Other (See Comments)    angioedema  . Shellfish Allergy Other (See Comments)    Gout exacerbation  . Allopurinol Nausea Only and Other (See Comments)    weakness  . Ativan [Lorazepam] Itching  . Sulfa Antibiotics Itching  . Ultram [Tramadol Hcl] Itching  . Valium [Diazepam] Other (See Comments)    Patient states that diazepam doesn't relax, it has the opposite effect.     Social History   Socioeconomic History  . Marital status: Married    Spouse name: Not on file  . Number of children: 2  . Years of education: 63  . Highest education level: Master's degree (e.g., MA, MS, MEng, MEd, MSW, MBA)  Occupational History  . Occupation: retired  Tobacco Use  . Smoking status: Former Smoker    Packs/day: 0.10    Years: 26.00    Pack years: 2.60    Types: Cigarettes    Quit date: 05/06/2007    Years since quitting: 12.8  . Smokeless tobacco: Never Used  Vaping Use  . Vaping Use: Never used  Substance and Sexual Activity  . Alcohol use: No  . Drug use: No  . Sexual activity: Yes  Other Topics Concern  . 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 Difficulty of Paying Living Expenses: Not on file  Food Insecurity:   . Worried About Charity fundraiser in the Last Year: Not on file  . Ran Out of Food in the Last Year: Not on file  Transportation Needs:   . Lack of Transportation  (Medical): Not on file  . Lack of Transportation (Non-Medical): Not on file  Physical Activity:   . Days of Exercise per Week: Not on file  . Minutes of Exercise per Session: Not on file  Stress:   . Feeling of Stress : Not on file  Social Connections:   . Frequency of Communication with Friends and Family: Not on file  . Frequency of Social Gatherings with Friends and Family: Not on file  . Attends Religious  Services: Not on file  . Active Member of Clubs or Organizations: Not on file  . Attends Archivist Meetings: Not on file  . Marital Status: Not on file  Intimate Partner Violence:   . Fear of Current or Ex-Partner: Not on file  . Emotionally Abused: Not on file  . Physically Abused: Not on file  . Sexually Abused: Not on file    Physical Exam Vitals reviewed.  Constitutional:      Appearance: She is normal weight.  HENT:     Head: Normocephalic.     Nose: Nose normal.     Mouth/Throat:     Mouth: Mucous membranes are moist.     Pharynx: Oropharynx is clear.  Eyes:     Pupils: Pupils are equal, round, and reactive to light.  Cardiovascular:     Rate and Rhythm: Normal rate and regular rhythm.     Pulses: Normal pulses.     Heart sounds: Normal heart sounds.  Pulmonary:     Effort: Pulmonary effort is normal.     Breath sounds: Normal breath sounds.  Abdominal:     General: Abdomen is flat.     Palpations: Abdomen is soft.  Musculoskeletal:        General: Normal range of motion.     Cervical back: Normal range of motion.     Right lower leg: Edema present.     Left lower leg: Edema present.  Skin:    General: Skin is warm and dry.     Capillary Refill: Capillary refill takes less than 2 seconds.  Neurological:     General: No focal deficit present.     Mental Status: She is alert. Mental status is at baseline.  Psychiatric:        Mood and Affect: Mood normal.     Arrived for home visit for Alechia who was alert and oriented reporting she was  feeling pretty good with no complaints. Wound on back of left leg was examined and noted to be improving and was healing well. Vitals were obtained and are as noted. EKO reading obtained.  Medications were reviewed. Julea was started on Hydroxyzine to assist with anxiety by her therapist. I placed same in AM and EVE. Lissandra was advised to reach out if she had any side effects. She agreed and understood same.     Refills:  Clonazepam    Future Appointments  Date Time Provider [place]  02/29/2020  8:50 AM Narda Amber K, DO [facility] None  03/11/2020 11:20 AM [facility] DEVICE REMOTES [facility]  03/14/2020 11:00 AM [facility] PA/NP [facility] None  04/04/2020  3:45 PM Landis Martins, DPM [facility]  04/11/2020 11:20 AM [facility] DEVICE REMOTES [facility]  04/23/2020  8:00 AM [facility] LAB [facility]  04/23/2020  8:35 AM [facility] None  05/07/2020 12:00 PM [facility] DEVICE 1 [facility]  05/09/2020  5:00 PM [facility] DEVICE REMOTES [facility]  06/12/2020 10:00 AM Philemon Kingdom, MD [facility] None  08/08/2020  5:00 PM [facility] DEVICE REMOTES [facility]  11/07/2020  5:00 PM [facility] DEVICE REMOTES [facility]  02/06/2021  5:00 PM [facility] DEVICE REMOTES [facility]  05/08/2021  5:00 PM [facility] DEVICE REMOTES [facility]  06/09/2021  5:00 PM [facility] DEVICE REMOTES [facility]  07/09/2021  5:00 PM [facility] DEVICE REMOTES [facility]     ACTION: Home visit completed Next visit planned for one week

## 2020-02-29 ENCOUNTER — Encounter: Payer: Self-pay | Admitting: Neurology

## 2020-02-29 ENCOUNTER — Ambulatory Visit: Payer: Medicare HMO | Admitting: Neurology

## 2020-02-29 ENCOUNTER — Other Ambulatory Visit: Payer: Self-pay

## 2020-02-29 VITALS — BP 120/72 | HR 79 | Ht 61.0 in | Wt 210.0 lb

## 2020-02-29 DIAGNOSIS — R419 Unspecified symptoms and signs involving cognitive functions and awareness: Secondary | ICD-10-CM

## 2020-02-29 DIAGNOSIS — G932 Benign intracranial hypertension: Secondary | ICD-10-CM | POA: Diagnosis not present

## 2020-02-29 DIAGNOSIS — E1140 Type 2 diabetes mellitus with diabetic neuropathy, unspecified: Secondary | ICD-10-CM

## 2020-02-29 DIAGNOSIS — I951 Orthostatic hypotension: Secondary | ICD-10-CM

## 2020-02-29 DIAGNOSIS — R102 Pelvic and perineal pain: Secondary | ICD-10-CM | POA: Diagnosis not present

## 2020-02-29 MED ORDER — ACETAZOLAMIDE 125 MG PO TABS
ORAL_TABLET | ORAL | 3 refills | Status: DC
Start: 2020-02-29 — End: 2021-01-17

## 2020-02-29 NOTE — Progress Notes (Signed)
Follow-up Visit   Date: 02/29/20    RUTHENE METHVIN MRN: [id number] DOB: February 19, 1951   Interim History: Rhonda [name] is a 69 y.o.  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  The patient was accompanied to the clinic by husband who also provides collateral information.    History of present illness: She reports having falls since November 2017, some which occurred by tripping over objects and loosing her balance.  Falls become much worse during 2018.  She often gets lightheaded preceding her falls and nearly always falls backwards.  Many of her falls were occurring in the kitchen while standing as she was preparing dinner.  She noticed that prolonged standing for 5 to 10 minutes always triggered her falls and was preceded by lightheadedness.  She was found to be orthostatic and started on midodrine 10 mg 3 times daily.   She has been diabetic for at least 10 years and has painful neuropathy of the feet, which is well-controlled on gabapentin 300mg  BID.  She takes extra gabapentin 800mg  as needed for severe pain, about once per week.   Over the past several years, she has noticed problems with short-term memory and family get frustrated with her because she is "slow".  She is able to cook and do laundry.  She manages her own medications and drives, but sometimes feels uncomfortable when driving. On one occasion, she forgot how to put her cars into her ignition or to start the wipers.  She is driving and has not been involved in any MVAs.  Her family complained that she is repeating herself often and sometimes she talks nonsensically.  She manages finances with her husband.  Prior work-up has included: CT cervical spine, CT brain.  Imaging showed increased  sclerosis of the calvarium and vertebral bodies.  Her PTH was elevated and she is seeing endocrinology.  Findings also showed possible intracranial hypertension and follow-up LP showed mildly raised ICP at 40mm H20.  There has been no change in her gait or falls following the spinal tap.  She was started on Diamox 125mg  daily.  For memory issues, she underwent neurocognitive testing, however, due to patient being very tired/sleepy, she was unable to engage in the testing, limiting formal diagnosis. However, there was evidence of severe depression and anxiety, which patient endorses.     UPDATE 02/29/20:  She is here for follow-up visit.  The frequency of her falls have reduced and her last fall was about 3 months ago. She is not sure what has helped. She continues to be forgetful, no worse than before.  Prior testing shows changes related to severe depression and anxiety.  She is followed by Dr. Casimiro Needle.  Her bigger issue is problems with her legs and gait which led to her seeing Dr. Marcello Moores at [facility].  CT myelogram shows severe spinal stenosis at L4-5 and surgery has been offered.  They are undecided on this.   Medications:  Current Outpatient Medications on File Prior to Visit  Medication Sig Dispense Refill  . acetaZOLAMIDE (DIAMOX) 125 MG tablet TAKE 1 TABLET(125 MG) BY MOUTH DAILY 90 tablet 3  . carboxymethylcellulose (REFRESH PLUS) 0.5 % SOLN Place 1 drop into both eyes 3 (three) times daily as needed (dry eyes).    . carvedilol (COREG) 3.125 MG  tablet TAKE 1 TABLET(3.125 MG) BY MOUTH TWICE DAILY WITH A MEAL 180 tablet 3  . citalopram (CELEXA) 20 MG tablet Take 20 mg by mouth daily.    . clonazePAM (KLONOPIN) 0.5 MG tablet Take 0.5 mg by mouth 3 (three) times daily as needed for anxiety.     . DULoxetine (CYMBALTA) 30 MG capsule Take 30 mg by mouth daily. Take once in the morning.    . fexofenadine (ALLEGRA) 180 MG tablet Take 180 mg by mouth 2 (two) times daily.     .  fludrocortisone (FLORINEF) 0.1 MG tablet TAKE 1 TABLET(0.1 MG) BY MOUTH TWICE DAILY 60 tablet 3  . gabapentin (NEURONTIN) 300 MG capsule Take 300 mg by mouth 3 (three) times daily.     Marland Kitchen gabapentin (NEURONTIN) 800 MG tablet Take 1 tablet (800 mg total) by mouth at bedtime as needed (neuropathy). 90 tablet 1  . HYDROcodone-acetaminophen (NORCO) 10-325 MG tablet Take 1 tablet by mouth every 6 (six) hours as needed.    . hydrOXYzine (ATARAX/VISTARIL) 25 MG tablet Take 25 mg by mouth 2 (two) times daily. Takes Morning and Bedtime    . ipratropium (ATROVENT) 0.03 % nasal spray Place 2 sprays into both nostrils 2 (two) times daily.    Marland Kitchen levothyroxine (SYNTHROID) 88 MCG tablet TAKE 1 TABLET(88 MCG) BY MOUTH DAILY BEFORE BREAKFAST 90 tablet 1  . metolazone (ZAROXOLYN) 2.5 MG tablet Take 1 tablet by mouth every Tuesday 12 tablet 3  . midodrine (PROAMATINE) 10 MG tablet Take 1 tablet (10 mg total) by mouth 3 (three) times daily with meals. 270 tablet 3  . mirtazapine (REMERON) 30 MG tablet TAKE 1 TABLET BY MOUTH EVERY NIGHT AT BEDTIME 30 tablet 3  . montelukast (SINGULAIR) 10 MG tablet Take 10 mg by mouth at bedtime.   1  . ondansetron (ZOFRAN) 8 MG tablet  (Patient not taking: Reported on 02/27/2020)    . potassium chloride SA (KLOR-CON) 20 MEQ tablet Take 2 tablets (40 mEq total) by mouth 3 (three) times daily. Take an extra 54meq(2 tablets) on the days you take the metolazone. 300 tablet 3  . probenecid (BENEMID) 500 MG tablet Take 500 mg by mouth 2 (two) times daily.    . simvastatin (ZOCOR) 10 MG tablet Take 10 mg by mouth every evening.     Marland Kitchen spironolactone (ALDACTONE) 25 MG tablet Take 1 tablet (25 mg total) by mouth every evening. 90 tablet 3  . torsemide (DEMADEX) 20 MG tablet Take 3 tablets (60 mg total) by mouth 2 (two) times daily. 540 tablet 3   No current facility-administered medications on file prior to visit.    Allergies:  Allergies  Allergen Reactions  . Ceftin Anaphylaxis    Face  and throat swell   . Geodon [Ziprasidone Hcl] Hives  . Lisinopril Other (See Comments)    angioedema  . Shellfish Allergy Other (See Comments)    Gout exacerbation  . Allopurinol Nausea Only and Other (See Comments)    weakness  . Ativan [Lorazepam] Itching  . Sulfa Antibiotics Itching  . Ultram [Tramadol Hcl] Itching  . Valium [Diazepam] Other (See Comments)    Patient states that diazepam doesn't relax, it has the opposite effect.    Review of Systems:  CONSTITUTIONAL: No fevers, chills, night sweats, or weight loss.  EYES: No visual changes or eye pain ENT: No hearing changes.  No history of nose bleeds.   RESPIRATORY: No cough, wheezing and shortness of breath.  +lightheadedness CARDIOVASCULAR: Negative for  chest pain, and palpitations.   GI: Negative for abdominal discomfort, blood in stools or black stools.  No recent change in bowel habits.   GU:  No history of incontinence.   MUSCLOSKELETAL: No history of joint pain or swelling.  No myalgias.   SKIN: Negative for lesions, rash, and itching.   ENDOCRINE: Negative for cold or heat intolerance, polydipsia or goiter.   PSYCH:  + depression +anxiety symptoms.   NEURO: As Above.   Vital Signs:  BP 120/72   Pulse 79   Ht 5\' 1"  (1.549 m)   Wt 210 lb (95.3 kg)   SpO2 100%   BMI 39.68 kg/m   No data found.  Neurological Exam: MENTAL STATUS including orientation to time, place, person, recent and remote memory is attention span and concentration, language, and fund of knowledge is fair.  Follows commands and answers questions easily.  Speech is slow and dysarthric (baseline).  CRANIAL NERVES:  Pupils are round and reactive. Extraocular muscles are intact throughout, except mild restriction with upgaze.  No ptosis.   MOTOR:  Motor strength is 5/5 in all extremities. No pronator drift.  Tone is normal.  No tremor.   REFLEXES:  Reflexes are 2+/4 throughout except 3+/4 at the knees bilaterally.   COORDINATION/GAIT:  Gait is  slightly-wide based, assisted with a cane.   Data: Labs 07/31/2016:  Vitamin B1 11, vitamin B12 597, copper 162, SPEP with IFE no M protein  CT head wo contrast performed at [facility] 11/24/2017: No acute intracranial abnormality appreciated.  Expanded empty sella may reflect idiopathic intracranial hypertension.  Thick dense calvarium which is of uncertain clinical significance.  Boney sclerosis can be seen with, but not limited to, hematological conditions such as myelofibrosis, metabolic bone disorders such as hyperthyroidism and hyperparathyroidism and other causes.  Correlate clinically.   CT cervical spine wo contrast 11/24/2017: 1.  No acute fracture 2.  No significant spinal canal or foraminal stenosis 3.  Increased sclerosis of the bony structures.  Boney sclerosis can be seen with, but not limited to, hematological conditions such as myelofibrosis, metabolic bone disorders such as hyperthyroidism and hyperparathyroidism and other causes.  Correlate clinically.    IMPRESSION/PLAN: 1.  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    - repeat neurocognitive testing declined  - continue to see psychiatry for mood disorder  2.  Falls due to orthostatic hypotension, improved  - Continue midodrine 10mg  TID prescribed by cardiology  - Continue florinef 0.1mg  BID  3.  Intracranial hypertension OP 26cm H20.   - Continue Diamox 125mg  daily  4.  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  Continue gabapentin 300 mg 3 times daily an extra 800 mg for severe pain.  5.  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   - Followed by Dr. Marcello Moores  Return to clinic in 1 year    Thank you for allowing me to participate in patient's care.  If I can answer any additional questions, I would be pleased to do so.    Sincerely,

## 2020-02-29 NOTE — Patient Instructions (Signed)
Return to clinic in 1 year.

## 2020-03-05 ENCOUNTER — Other Ambulatory Visit (HOSPITAL_COMMUNITY): Payer: Self-pay

## 2020-03-05 NOTE — Progress Notes (Signed)
Paramedicine Encounter    Patient ID: Rhonda [name], female    DOB: [birth year], 69 y.o.   MRN: [id number]   Patient Care Team: Maurice Small, MD as PCP - General (Family Medicine) [name], LCSW as Social Worker (Licensed Clinical Social Worker) [name], [name], DO as Consulting Physician (Neurology)  Patient Active Problem List   Diagnosis Date Noted  . Diabetes mellitus ([place]) 06/15/2019  . Hypokalemia 11/08/2018  . CKD (chronic kidney disease) stage 3, GFR 30-59 ml/min (HCC) 11/08/2018  . Orthostatic hypotension 07/28/2017  . SVD (spontaneous vaginal delivery)   . Peripheral neuropathy   . On home oxygen therapy   . Migraines   . Left bundle branch block   . Hypothyroidism   . Hypertension   . Hyperlipidemia   . Heart murmur   . GERD (gastroesophageal reflux disease)   . Exertional shortness of breath   . Major depressive disorder   . Back pain   . Arthritis   . Generalized anxiety disorder   . Anemia   . Chronic respiratory failure ([place]) 09/14/2013  . 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 Morbid obesity ([place]) 11/01/2012  . Chronic systolic heart failure ([place]) 10/27/2012  . Endometrial polyp 01/20/2012  . Malignant tumor of breast ([place]) 03/26/2011  . Unspecified vitamin D deficiency 03/26/2011    Current Outpatient Medications:  .  acetaZOLAMIDE (DIAMOX) 125 MG tablet, TAKE 1 TABLET(125 MG) BY MOUTH DAILY, Disp: 90 tablet, Rfl: 3 .  carboxymethylcellulose (REFRESH PLUS) 0.5 % SOLN, Place 1 drop into both eyes 3 (three) times daily as needed (dry eyes)., Disp: , Rfl:  .  carvedilol (COREG) 3.125 MG tablet, TAKE 1 TABLET(3.125 MG) BY MOUTH TWICE DAILY WITH A MEAL, Disp: 180 tablet, Rfl: 3 .  citalopram (CELEXA) 20 MG tablet, Take 20 mg by mouth daily., Disp: , Rfl:  .  clonazePAM (KLONOPIN) 0.5 MG tablet, Take 0.5 mg by mouth 3 (three) times daily as needed for anxiety. , Disp: , Rfl:  .  dicyclomine (BENTYL) 20 MG tablet,  Take 20 mg by mouth every 6 (six) hours., Disp: , Rfl:  .  DULoxetine (CYMBALTA) 30 MG capsule, Take 30 mg by mouth daily. Take once in the morning., Disp: , Rfl:  .  fexofenadine (ALLEGRA) 180 MG tablet, Take 180 mg by mouth 2 (two) times daily. , Disp: , Rfl:  .  fludrocortisone (FLORINEF) 0.1 MG tablet, TAKE 1 TABLET(0.1 MG) BY MOUTH TWICE DAILY, Disp: 60 tablet, Rfl: 3 .  gabapentin (NEURONTIN) 300 MG capsule, Take 300 mg by mouth 3 (three) times daily. , Disp: , Rfl:  .  gabapentin (NEURONTIN) 800 MG tablet, Take 1 tablet (800 mg total) by mouth at bedtime as needed (neuropathy)., Disp: 90 tablet, Rfl: 1 .  Ginkgo Biloba 120 MG TABS, Take by mouth., Disp: , Rfl:  .  HYDROcodone-acetaminophen (NORCO) 10-325 MG tablet, Take 1 tablet by mouth every 6 (six) hours as needed., Disp: , Rfl:  .  hydrOXYzine (ATARAX/VISTARIL) 25 MG tablet, Take 25 mg by mouth 2 (two) times daily. Takes Morning and Bedtime, Disp: , Rfl:  .  hydrOXYzine (VISTARIL) 25 MG capsule, Take 25 mg by mouth 3 (three) times daily as needed., Disp: , Rfl:  .  ipratropium (ATROVENT) 0.03 % nasal spray, Place 2 sprays into both nostrils 2 (two) times daily., Disp: , Rfl:  .  levothyroxine (SYNTHROID) 88 MCG tablet, TAKE 1 TABLET(88 MCG) BY MOUTH DAILY BEFORE BREAKFAST, Disp: 90 tablet, Rfl:  1 .  metolazone (ZAROXOLYN) 2.5 MG tablet, Take 1 tablet by mouth every Tuesday, Disp: 12 tablet, Rfl: 3 .  midodrine (PROAMATINE) 10 MG tablet, Take 1 tablet (10 mg total) by mouth 3 (three) times daily with meals., Disp: 270 tablet, Rfl: 3 .  mirtazapine (REMERON) 30 MG tablet, TAKE 1 TABLET BY MOUTH EVERY NIGHT AT BEDTIME, Disp: 30 tablet, Rfl: 3 .  montelukast (SINGULAIR) 10 MG tablet, Take 10 mg by mouth at bedtime. , Disp: , Rfl: 1 .  ondansetron (ZOFRAN) 8 MG tablet, , Disp: , Rfl:  .  potassium chloride SA (KLOR-CON) 20 MEQ tablet, Take 2 tablets (40 mEq total) by mouth 3 (three) times daily. Take an extra 61meq(2 tablets) on the days you  take the metolazone., Disp: 300 tablet, Rfl: 3 .  probenecid (BENEMID) 500 MG tablet, Take 500 mg by mouth 2 (two) times daily., Disp: , Rfl:  .  simvastatin (ZOCOR) 10 MG tablet, Take 10 mg by mouth every evening. , Disp: , Rfl:  .  spironolactone (ALDACTONE) 25 MG tablet, Take 1 tablet (25 mg total) by mouth every evening., Disp: 90 tablet, Rfl: 3 .  torsemide (DEMADEX) 20 MG tablet, Take 3 tablets (60 mg total) by mouth 2 (two) times daily., Disp: 540 tablet, Rfl: 3 Allergies  Allergen Reactions  . Ceftin Anaphylaxis    Face and throat swell   . Geodon [Ziprasidone Hcl] Hives  . Lisinopril Other (See Comments)    angioedema  . Shellfish Allergy Other (See Comments)    Gout exacerbation  . Allopurinol Nausea Only and Other (See Comments)    weakness  . Ativan [Lorazepam] Itching  . Sulfa Antibiotics Itching  . Ultram [Tramadol Hcl] Itching  . Valium [Diazepam] Other (See Comments)    Patient states that diazepam doesn't relax, it has the opposite effect.     Social History   Socioeconomic History  . Marital status: Married    Spouse name: Not on file  . Number of children: 2  . Years of education: 68  . Highest education level: Master's degree (e.g., MA, MS, MEng, MEd, MSW, MBA)  Occupational History  . Occupation: retired  Tobacco Use  . Smoking status: Former Smoker    Packs/day: 0.10    Years: 26.00    Pack years: 2.60    Types: Cigarettes    Quit date: 05/06/2007    Years since quitting: 12.8  . Smokeless tobacco: Never Used  Vaping Use  . Vaping Use: Never used  Substance and Sexual Activity  . Alcohol use: No  . Drug use: No  . Sexual activity: Yes  Other Topics Concern  . Not on file  Social History Narrative   Tobacco Use Cigarettes: Former Smoker, Quit in 2008   No Alcohol   No recreational drug use   Diet: Regular/Low Carb   Exercise: None   Occupation: disabled   Education: Research officer, political party, masters   Children: 2   Firearms: No   Therapist, art Use:  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 Difficulty of Paying Living Expenses: Not on file  Food Insecurity:   . Worried About Charity fundraiser in the Last Year: Not on file  . Ran Out of Food in the Last Year: Not on file  Transportation Needs:   . Lack of Transportation (Medical): Not  on file  . Lack of Transportation (Non-Medical): Not on file  Physical Activity:   . Days of Exercise per Week: Not on file  . Minutes of Exercise per Session: Not on file  Stress:   . Feeling of Stress : Not on file  Social Connections:   . Frequency of Communication with Friends and Family: Not on file  . Frequency of Social Gatherings with Friends and Family: Not on file  . Attends Religious Services: Not on file  . Active Member of Clubs or Organizations: Not on file  . Attends Archivist Meetings: Not on file  . Marital Status: Not on file  Intimate Partner Violence:   . Fear of Current or Ex-Partner: Not on file  . Emotionally Abused: Not on file  . Physically Abused: Not on file  . Sexually Abused: Not on file    Physical Exam Vitals reviewed.  Constitutional:      Appearance: She is normal weight.  HENT:     Head: Normocephalic.     Nose: Nose normal.     Mouth/Throat:     Mouth: Mucous membranes are moist.     Pharynx: Oropharynx is clear.  Eyes:     Pupils: Pupils are equal, round, and reactive to light.  Cardiovascular:     Rate and Rhythm: Normal rate and regular rhythm.     Pulses: Normal pulses.     Heart sounds: Normal heart sounds.  Pulmonary:     Effort: Pulmonary effort is normal.     Breath sounds: Normal breath sounds.  Abdominal:     General: Abdomen is flat.     Palpations: Abdomen is soft.  Musculoskeletal:        General: Swelling present. Normal range of motion.     Cervical back: Normal range of motion.     Right lower leg: Edema present.     Left  lower leg: Edema present.  Skin:    General: Skin is warm and dry.     Capillary Refill: Capillary refill takes less than 2 seconds.  Neurological:     Mental Status: She is alert. Mental status is at baseline.  Psychiatric:        Mood and Affect: Mood normal.     Comments: Depressed today.      Arrived for home visit for Rhonda [name] who was alert and oriented ambulating around her home on my arrival. Rhonda [name] reports feeling sad after getting the news she was discharged from her therapists office today after two apparent no shows. Rhonda [name] is actively seeking a new therapy group. I will assist with same. Vitals were obtained and are as noted. Medications were reviewed and confirmed. Pill box filled accordingly. Rhonda [name] was given her daily medications during our visit. Swelling noted to be same as last visit no changes. Rhonda [name] was scheduled for Pelvic Floor Therapy as referred by her OBGYN. I assisted with same. Patient agreed to continue to maintain her diet and I educated her on certain foods to avoid as well as decreasing her fluid intake. Rhonda [name] agreed. Home visit complete. I will see patient in one week.   Refills: Clonazepam   Future Appointments  Date Time Provider [place]  03/11/2020 11:20 AM [facility] DEVICE REMOTES [facility]  03/14/2020 11:00 AM [facility] PA/NP [facility] None  04/04/2020  3:45 PM Landis Martins, DPM [facility]  04/11/2020 11:20 AM [facility] DEVICE REMOTES [facility]  04/23/2020  8:00 AM [facility] LAB [facility]  04/23/2020  8:35  AM [facility]EN[facility] None  05/07/2020 12:00 PM [facility] DEVICE 1 [facility]  05/09/2020  5:00 PM [facility] DEVICE REMOTES [facility]  05/13/2020  2:00 PM Desenglau, Tommy Rainwater, PT [facility]  05/20/2020 12:30 PM Desenglau, Tommy Rainwater, PT [facility]  06/12/2020 10:00 AM Philemon Kingdom, MD [facility] None  08/08/2020  5:00 PM  [facility] DEVICE REMOTES [facility]  11/07/2020  5:00 PM [facility] DEVICE REMOTES [facility]  02/06/2021  5:00 PM [facility] DEVICE REMOTES [facility]  02/27/2021  9:50 AM [name], Donika K, DO [facility] None  05/08/2021  5:00 PM [facility] DEVICE REMOTES [facility]  06/09/2021  5:00 PM [facility] DEVICE REMOTES [facility]  07/09/2021  5:00 PM [facility] DEVICE REMOTES [facility]     ACTION: Home visit completed Next visit planned for one week

## 2020-03-11 ENCOUNTER — Ambulatory Visit (INDEPENDENT_AMBULATORY_CARE_PROVIDER_SITE_OTHER): Payer: Medicare HMO

## 2020-03-11 DIAGNOSIS — Z95810 Presence of automatic (implantable) cardiac defibrillator: Secondary | ICD-10-CM

## 2020-03-11 DIAGNOSIS — I428 Other cardiomyopathies: Secondary | ICD-10-CM

## 2020-03-11 DIAGNOSIS — I5022 Chronic systolic (congestive) heart failure: Secondary | ICD-10-CM | POA: Diagnosis not present

## 2020-03-12 ENCOUNTER — Other Ambulatory Visit (HOSPITAL_COMMUNITY): Payer: Self-pay

## 2020-03-12 LAB — CUP PACEART REMOTE DEVICE CHECK
Battery Remaining Longevity: 1 mo
Battery Voltage: 2.73 V
Brady Statistic AP VP Percent: 0.52 %
Brady Statistic AP VS Percent: 0.01 %
Brady Statistic AS VP Percent: 99.45 %
Brady Statistic AS VS Percent: 0.01 %
Brady Statistic RA Percent Paced: 0.53 %
Brady Statistic RV Percent Paced: 99.96 %
Date Time Interrogation Session: 20211018132409
HighPow Impedance: 50 Ohm
Implantable Lead Implant Date: 20141210
Implantable Lead Implant Date: 20141210
Implantable Lead Implant Date: 20141210
Implantable Lead Location: 753858
Implantable Lead Location: 753859
Implantable Lead Location: 753860
Implantable Lead Model: 4396
Implantable Lead Model: 5076
Implantable Lead Model: 6935
Implantable Pulse Generator Implant Date: 20141210
Lead Channel Impedance Value: 342 Ohm
Lead Channel Impedance Value: 342 Ohm
Lead Channel Impedance Value: 456 Ohm
Lead Channel Impedance Value: 513 Ohm
Lead Channel Impedance Value: 589 Ohm
Lead Channel Impedance Value: 627 Ohm
Lead Channel Pacing Threshold Amplitude: 0.625 V
Lead Channel Pacing Threshold Amplitude: 1 V
Lead Channel Pacing Threshold Amplitude: 1.125 V
Lead Channel Pacing Threshold Pulse Width: 0.4 ms
Lead Channel Pacing Threshold Pulse Width: 0.4 ms
Lead Channel Pacing Threshold Pulse Width: 0.4 ms
Lead Channel Sensing Intrinsic Amplitude: 19.25 mV
Lead Channel Sensing Intrinsic Amplitude: 19.25 mV
Lead Channel Sensing Intrinsic Amplitude: 2.375 mV
Lead Channel Sensing Intrinsic Amplitude: 2.375 mV
Lead Channel Setting Pacing Amplitude: 2 V
Lead Channel Setting Pacing Amplitude: 2.5 V
Lead Channel Setting Pacing Amplitude: 2.5 V
Lead Channel Setting Pacing Pulse Width: 0.4 ms
Lead Channel Setting Pacing Pulse Width: 0.4 ms
Lead Channel Setting Sensing Sensitivity: 0.3 mV

## 2020-03-12 NOTE — Progress Notes (Signed)
Paramedicine Encounter    Patient ID: Rhonda [name], female    DOB: [birth year], 69 y.o.   MRN: [id number]   Patient Care Team: Maurice Small, MD as PCP - General (Family Medicine) [name], LCSW as Social Worker (Licensed Clinical Social Worker) [name], [name], DO as Consulting Physician (Neurology)  Patient Active Problem List   Diagnosis Date Noted  . Diabetes mellitus ([place]) 06/15/2019  . Hypokalemia 11/08/2018  . CKD (chronic kidney disease) stage 3, GFR 30-59 ml/min (HCC) 11/08/2018  . Orthostatic hypotension 07/28/2017  . SVD (spontaneous vaginal delivery)   . Peripheral neuropathy   . On home oxygen therapy   . Migraines   . Left bundle branch block   . Hypothyroidism   . Hypertension   . Hyperlipidemia   . Heart murmur   . GERD (gastroesophageal reflux disease)   . Exertional shortness of breath   . Major depressive disorder   . Back pain   . Arthritis   . Generalized anxiety disorder   . Anemia   . Chronic respiratory failure ([place]) 09/14/2013  . 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 Morbid obesity ([place]) 11/01/2012  . Chronic systolic heart failure ([place]) 10/27/2012  . Endometrial polyp 01/20/2012  . Malignant tumor of breast ([place]) 03/26/2011  . Unspecified vitamin D deficiency 03/26/2011    Current Outpatient Medications:  .  acetaZOLAMIDE (DIAMOX) 125 MG tablet, TAKE 1 TABLET(125 MG) BY MOUTH DAILY, Disp: 90 tablet, Rfl: 3 .  carboxymethylcellulose (REFRESH PLUS) 0.5 % SOLN, Place 1 drop into both eyes 3 (three) times daily as needed (dry eyes)., Disp: , Rfl:  .  carvedilol (COREG) 3.125 MG tablet, TAKE 1 TABLET(3.125 MG) BY MOUTH TWICE DAILY WITH A MEAL, Disp: 180 tablet, Rfl: 3 .  citalopram (CELEXA) 20 MG tablet, Take 20 mg by mouth daily., Disp: , Rfl:  .  clonazePAM (KLONOPIN) 0.5 MG tablet, Take 0.5 mg by mouth 3 (three) times daily as needed for anxiety. , Disp: , Rfl:  .  dicyclomine (BENTYL) 20 MG tablet,  Take 20 mg by mouth every 6 (six) hours., Disp: , Rfl:  .  DULoxetine (CYMBALTA) 30 MG capsule, Take 30 mg by mouth daily. Take once in the morning., Disp: , Rfl:  .  fexofenadine (ALLEGRA) 180 MG tablet, Take 180 mg by mouth 2 (two) times daily. , Disp: , Rfl:  .  fludrocortisone (FLORINEF) 0.1 MG tablet, TAKE 1 TABLET(0.1 MG) BY MOUTH TWICE DAILY, Disp: 60 tablet, Rfl: 3 .  gabapentin (NEURONTIN) 300 MG capsule, Take 300 mg by mouth 3 (three) times daily. , Disp: , Rfl:  .  gabapentin (NEURONTIN) 800 MG tablet, Take 1 tablet (800 mg total) by mouth at bedtime as needed (neuropathy)., Disp: 90 tablet, Rfl: 1 .  Ginkgo Biloba 120 MG TABS, Take by mouth., Disp: , Rfl:  .  HYDROcodone-acetaminophen (NORCO) 10-325 MG tablet, Take 1 tablet by mouth every 6 (six) hours as needed., Disp: , Rfl:  .  hydrOXYzine (ATARAX/VISTARIL) 25 MG tablet, Take 25 mg by mouth 2 (two) times daily. Takes Morning and Bedtime, Disp: , Rfl:  .  hydrOXYzine (VISTARIL) 25 MG capsule, Take 25 mg by mouth 3 (three) times daily as needed., Disp: , Rfl:  .  ipratropium (ATROVENT) 0.03 % nasal spray, Place 2 sprays into both nostrils 2 (two) times daily., Disp: , Rfl:  .  levothyroxine (SYNTHROID) 88 MCG tablet, TAKE 1 TABLET(88 MCG) BY MOUTH DAILY BEFORE BREAKFAST, Disp: 90 tablet, Rfl:  1 .  metolazone (ZAROXOLYN) 2.5 MG tablet, Take 1 tablet by mouth every Tuesday, Disp: 12 tablet, Rfl: 3 .  midodrine (PROAMATINE) 10 MG tablet, Take 1 tablet (10 mg total) by mouth 3 (three) times daily with meals., Disp: 270 tablet, Rfl: 3 .  mirtazapine (REMERON) 30 MG tablet, TAKE 1 TABLET BY MOUTH EVERY NIGHT AT BEDTIME, Disp: 30 tablet, Rfl: 3 .  montelukast (SINGULAIR) 10 MG tablet, Take 10 mg by mouth at bedtime. , Disp: , Rfl: 1 .  ondansetron (ZOFRAN) 8 MG tablet, , Disp: , Rfl:  .  potassium chloride SA (KLOR-CON) 20 MEQ tablet, Take 2 tablets (40 mEq total) by mouth 3 (three) times daily. Take an extra 61meq(2 tablets) on the days you  take the metolazone., Disp: 300 tablet, Rfl: 3 .  probenecid (BENEMID) 500 MG tablet, Take 500 mg by mouth 2 (two) times daily., Disp: , Rfl:  .  simvastatin (ZOCOR) 10 MG tablet, Take 10 mg by mouth every evening. , Disp: , Rfl:  .  spironolactone (ALDACTONE) 25 MG tablet, Take 1 tablet (25 mg total) by mouth every evening., Disp: 90 tablet, Rfl: 3 .  torsemide (DEMADEX) 20 MG tablet, Take 3 tablets (60 mg total) by mouth 2 (two) times daily., Disp: 540 tablet, Rfl: 3 Allergies  Allergen Reactions  . Ceftin Anaphylaxis    Face and throat swell   . Geodon [Ziprasidone Hcl] Hives  . Lisinopril Other (See Comments)    angioedema  . Shellfish Allergy Other (See Comments)    Gout exacerbation  . Allopurinol Nausea Only and Other (See Comments)    weakness  . Ativan [Lorazepam] Itching  . Sulfa Antibiotics Itching  . Ultram [Tramadol Hcl] Itching  . Valium [Diazepam] Other (See Comments)    Patient states that diazepam doesn't relax, it has the opposite effect.     Social History   Socioeconomic History  . Marital status: Married    Spouse name: Not on file  . Number of children: 2  . Years of education: 68  . Highest education level: Master's degree (e.g., MA, MS, MEng, MEd, MSW, MBA)  Occupational History  . Occupation: retired  Tobacco Use  . Smoking status: Former Smoker    Packs/day: 0.10    Years: 26.00    Pack years: 2.60    Types: Cigarettes    Quit date: 05/06/2007    Years since quitting: 12.8  . Smokeless tobacco: Never Used  Vaping Use  . Vaping Use: Never used  Substance and Sexual Activity  . Alcohol use: No  . Drug use: No  . Sexual activity: Yes  Other Topics Concern  . Not on file  Social History Narrative   Tobacco Use Cigarettes: Former Smoker, Quit in 2008   No Alcohol   No recreational drug use   Diet: Regular/Low Carb   Exercise: None   Occupation: disabled   Education: Research officer, political party, masters   Children: 2   Firearms: No   Therapist, art Use:  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 Difficulty of Paying Living Expenses: Not on file  Food Insecurity:   . Worried About Charity fundraiser in the Last Year: Not on file  . Ran Out of Food in the Last Year: Not on file  Transportation Needs:   . Lack of Transportation (Medical): Not  on file  . Lack of Transportation (Non-Medical): Not on file  Physical Activity:   . Days of Exercise per Week: Not on file  . Minutes of Exercise per Session: Not on file  Stress:   . Feeling of Stress : Not on file  Social Connections:   . Frequency of Communication with Friends and Family: Not on file  . Frequency of Social Gatherings with Friends and Family: Not on file  . Attends Religious Services: Not on file  . Active Member of Clubs or Organizations: Not on file  . Attends Archivist Meetings: Not on file  . Marital Status: Not on file  Intimate Partner Violence:   . Fear of Current or Ex-Partner: Not on file  . Emotionally Abused: Not on file  . Physically Abused: Not on file  . Sexually Abused: Not on file    Physical Exam Vitals reviewed.  Constitutional:      Appearance: She is normal weight.  HENT:     Head: Normocephalic.     Nose: Rhinorrhea present.     Mouth/Throat:     Mouth: Mucous membranes are moist.     Pharynx: Oropharynx is clear.  Eyes:     Pupils: Pupils are equal, round, and reactive to light.  Cardiovascular:     Rate and Rhythm: Normal rate and regular rhythm.     Pulses: Normal pulses.     Heart sounds: Normal heart sounds.  Pulmonary:     Effort: Pulmonary effort is normal.     Breath sounds: Normal breath sounds.  Abdominal:     General: Abdomen is flat.     Palpations: Abdomen is soft.  Musculoskeletal:        General: Swelling present. Normal range of motion.     Cervical back: Normal range of motion.     Right lower leg: Edema present.      Left lower leg: Edema present.  Skin:    General: Skin is warm and dry.     Capillary Refill: Capillary refill takes less than 2 seconds.  Neurological:     General: No focal deficit present.     Mental Status: She is alert.  Psychiatric:        Mood and Affect: Mood normal.     Arrived for home visit for Charlsey who was alert and oriented ambulating around her kitchen reporting she feels pretty good today with no complaints. Vitals were obtained. Weight down one pound today. Medications were reviewed and confirmed. Pill box filled accordingly. Maclaine was given her noon medicines during our visit. Mckinsey was reminded of upcoming appointment in heart failure clinic on Thursday at 46. [name] agreed and stated she would be there. Legs noted to have some swelling with edema but no more than her normal. [name] agreed to do better at using her compression stockings and elevating her feet. Home visit completed. I will see patient in one week.   Refills:  MIDODRINE     Future Appointments  Date Time Provider [place]  03/14/2020 11:00 AM [facility] PA/NP [facility] None  04/04/2020  3:45 PM Landis Martins, DPM [facility]  04/11/2020 11:20 AM [facility] DEVICE REMOTES [facility]  04/23/2020  8:00 AM [facility] LAB [facility]  04/23/2020  8:35 AM [facility] None  05/07/2020 12:00 PM [facility] DEVICE 1 [facility]  05/09/2020  5:00 PM [facility] DEVICE REMOTES [facility]  05/13/2020  2:00 PM Desenglau, Tommy Rainwater, PT [facility]  05/20/2020 12:30 PM Desenglau,  Tommy Rainwater, PT [facility]  06/12/2020 10:00 AM Philemon Kingdom, MD [facility] None  08/08/2020  5:00 PM [facility] DEVICE REMOTES [facility]  11/07/2020  5:00 PM [facility] DEVICE REMOTES [facility]  02/06/2021  5:00 PM [facility] DEVICE REMOTES [facility]  02/27/2021  9:50 AM [name], Donika K, DO  [facility] None  05/08/2021  5:00 PM [facility] DEVICE REMOTES [facility]  06/09/2021  5:00 PM [facility] DEVICE REMOTES [facility]  07/09/2021  5:00 PM [facility] DEVICE REMOTES [facility]     ACTION: Home visit completed Next visit planned for one week

## 2020-03-12 NOTE — Progress Notes (Signed)
EPIC Encounter for ICM Monitoring  Patient Name: Rhonda [name] is a 69 y.o. female Date: 03/12/2020 Primary Care Physican: Maurice Small, MD Primary Cardiologist:Bensimhon Electrophysiologist: Santina Evans Pacing:  100% 9/14/2021OfficeWeight:207lbs  Battery ERI reached 02/17/2020   Transmission reviewed. Scheduled for device battery replacement 04/25/2020.    Optivol thoracic impedancenormal.  Prescribed: Patient participating in paramedicine program to assist with meds.  Torsemide 20 mg3tablets (60 mg total)by mouthtwice a day.  Potassium 20 meq2tablets (40 mEq total) by mouth 3 (three) times daily.Take extra 40 mEq on days you take Metolazone.  Metolazone 2.5mg  1 tabletevery Tuesday.  Spironolactone 25 mg take 1 tablet by mouth every evening.  Labs: 11/29/2019 Creatinine1.41Oda Kilts, Sodium141, UEK80-03 11/22/2019 Creatinine1.43, BUN29, Potassium3.0, Sodium140, [id number] A complete set of results can be found in Results Review.  Recommendations: Patient has office visit on 03/14/2020 and recommendations will be given at that time.    Follow-up plan: ICM clinic phone appointment on11/19/2021. 91 day device clinic remote transmission12/16/2021.   EP/Cardiology Office Visits:03/14/2020 with[facility] PA/NP.   Copy of ICM check sent to Dr.Taylor.     3 month ICM trend: 03/11/2020    1 Year ICM trend:       [name], RN 03/12/2020 5:17 PM

## 2020-03-13 NOTE — Progress Notes (Signed)
ADVANCED HF CLINIC NOTE   Patient ID: Rhonda [name], female   DOB: 1950-08-17, 69 y.o.   MRN: [id number]  Primary Cardiologist: Dr Radford Pax  General Surgeon: Dr Donne Hazel  Orthopedic: Dr Alvan Dame  PCP: Dr Justin Mend Referring MD: Dr Radford Pax  HPI: Rhonda [name] is a 69 y.o. 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 She has had sleep study with no  evidence of sleep apnea.  She has a Medtronic CRT-D device. Echo in 7/18 showed recovery of EF to 55-60%.   Has been followed by Narda Amber in Neurology. Has been on Florinef and midodrine for orthostatic hypotension (failed mestinon).   Today she returns for HF follow up.Overall feeling fine.  Main complaint is back pain. Having some SOB but limited by back pain. Denies PND/Orthopnea. Appetite ok. No fever or chills. Weight at home has been stable. Taking all medications. Followed by HF Paramedicine.   Optivol: Activity ~ 2hours, No VT, No A fib, fluid index well below threshold.   PYP 4/19 negative TTR  10/14/12 ECHO EF 35% RV ok  04/12/13 ECHO EF 20-25%, LV moderately dilated 10/11/2013 ECHO EF 45-50% 7/18 ECHO EF 55-60%, normal RV size and systolic function, PASP 34 mmHg  06/07/19 Echo: EF 60-65% grade II DD. RV ok  11/24/12 Creatinine 1.23 Potassium 4.1 12/15/12 Creatinine 2.03 Potassium 3.3  12/22/12 Creatinine 1.2 Potassium 3.8 02/23/13 Creatinine 1.68 K 4.4  05/02/13 K 4.2 Creatinine 1.8  10/11/13 K 3.7 Creatinine 1.69 10/17 K 4.1, creatinine 1.35 09/2017: K3.8 Creatinine 1.57  Review of systems complete and found to be negative unless listed in HPI.   Past Medical History:  Diagnosis Date  . Anemia   . Arthritis     Right knee  . Asthma   . Back pain    Disk problem  . Breast cancer, stage 1 ([place]) 03/26/2011   Left; completed chemotherapy and radiation treatments  . Cardiomyopathy   . Chronic respiratory failure ([place]) 09/14/2013  . Chronic systolic heart failure (HCC)    a) NICM b) ECHO (03/2013) EF 20-25% c) ECHO (09/2013) EF 45-50%, grade I DD  . CKD (chronic kidney disease) stage 3, GFR 30-59 ml/min ([place]) 11/08/2018  . Complication of anesthesia    History of low blood pressure after surgery; attributed to lying flat  . Diabetes mellitus    "diet controlled" (05/03/2013)  . Exertional shortness of breath   . Generalized anxiety disorder   . GERD (gastroesophageal reflux disease)   . Gout   . Heart murmur   . Hyperlipidemia   . Hypertension   . Hypokalemia 11/08/2018  . Hypothyroidism   . Left bundle branch block    s/p CRT-D (04/2013)  . Major depressive disorder   . Migraines   . On home oxygen therapy    "2L suppose to be q night" (05/03/2013)  . Orthostatic hypotension 07/28/2017  . Peripheral neuropathy    Feet  . SVD (spontaneous vaginal delivery)    x 2  . Unspecified vitamin D deficiency 03/26/2011   Does not take meds    Current Outpatient Medications  Medication Sig Dispense Refill  . acetaZOLAMIDE (DIAMOX) 125 MG tablet TAKE 1 TABLET(125 MG) BY MOUTH  DAILY 90 tablet 3  . carvedilol (COREG) 3.125 MG tablet TAKE 1 TABLET(3.125 MG) BY MOUTH TWICE DAILY WITH A MEAL 180 tablet 3  . citalopram (CELEXA) 20 MG tablet Take 20 mg by mouth daily.    Marland Kitchen dicyclomine (BENTYL) 20 MG tablet Take 20 mg by mouth every 6 (six) hours.    . DULoxetine (CYMBALTA) 30 MG capsule Take 30 mg by mouth daily. Take once in the morning.    . fexofenadine (ALLEGRA) 180 MG tablet Take 180 mg by mouth 2 (two) times daily.     . fludrocortisone (FLORINEF) 0.1 MG tablet TAKE 1 TABLET(0.1 MG) BY MOUTH TWICE DAILY 60 tablet 3  . gabapentin (NEURONTIN) 300 MG capsule Take 300 mg by mouth 3 (three) times daily.      Marland Kitchen gabapentin (NEURONTIN) 800 MG tablet Take 1 tablet (800 mg total) by mouth at bedtime as needed (neuropathy). 90 tablet 1  . Ginkgo Biloba 120 MG TABS Take by mouth.    . hydrOXYzine (ATARAX/VISTARIL) 25 MG tablet Take 25 mg by mouth 2 (two) times daily. Takes Morning and Bedtime    . hydrOXYzine (VISTARIL) 25 MG capsule Take 25 mg by mouth 3 (three) times daily as needed.    Marland Kitchen ipratropium (ATROVENT) 0.03 % nasal spray Place 2 sprays into both nostrils 2 (two) times daily.    Marland Kitchen levothyroxine (SYNTHROID) 88 MCG tablet TAKE 1 TABLET(88 MCG) BY MOUTH DAILY BEFORE BREAKFAST 90 tablet 1  . metolazone (ZAROXOLYN) 2.5 MG tablet Take 1 tablet by mouth every Tuesday 12 tablet 3  . midodrine (PROAMATINE) 10 MG tablet Take 1 tablet (10 mg total) by mouth 3 (three) times daily with meals. 270 tablet 3  . mirtazapine (REMERON) 30 MG tablet TAKE 1 TABLET BY MOUTH EVERY NIGHT AT BEDTIME 30 tablet 3  . montelukast (SINGULAIR) 10 MG tablet Take 10 mg by mouth at bedtime.   1  . omeprazole (PRILOSEC) 40 MG capsule Take 40 mg by mouth daily.    . potassium chloride SA (KLOR-CON) 20 MEQ tablet Take 2 tablets (40 mEq total) by mouth 3 (three) times daily. Take an extra 65meq(2 tablets) on the days you take the metolazone. 300 tablet 3  . probenecid (BENEMID) 500 MG tablet Take 500 mg by mouth 2 (two) times daily.    . simvastatin (ZOCOR) 10 MG tablet Take 10 mg by mouth every evening.     Marland Kitchen spironolactone (ALDACTONE) 25 MG tablet Take 1 tablet (25 mg total) by mouth every evening. 90 tablet 3  . torsemide (DEMADEX) 20 MG tablet Take 3 tablets (60 mg total) by mouth 2 (two) times daily. 540 tablet 3  . traMADol (ULTRAM) 50 MG tablet Take by mouth every 6 (six) hours as needed.     No current facility-administered medications for this encounter.    Vitals:   03/14/20 1114  BP: 133/86  Pulse: 86  SpO2: 98%    Wt Readings from Last 3 Encounters:  03/14/20 95.6 kg (210 lb 12.8 oz)  03/12/20 93.9 kg (207 lb)   03/05/20 94.3 kg (208 lb)    PHYSICAL EXAM: General: Back brace on. No resp difficulty HEENT: normal Neck: supple. no JVD. Carotids 2+ bilat; no bruits. No lymphadenopathy or thryomegaly appreciated. Cor: PMI nondisplaced. Regular rate & rhythm. No rubs, gallops or murmurs. Lungs: clear Abdomen: obese, soft, nontender, nondistended. No hepatosplenomegaly. No bruits or masses. Good bowel sounds. Extremities: no cyanosis, clubbing, rash, edema Neuro: alert & orientedx3, cranial nerves grossly intact.  moves all 4 extremities w/o difficulty. Affect pleasant   ASSESSMENT & PLAN:  1) Chronic systolic HF: NICM s/p Medtronic CRT-D, ?Viral myocarditis. Cardiomyopathy dates from 74. EF 45-50% (09/2013), EF up to 55-60% on 20202.  PYP 09/02/17 negative for TTR (Grade 0-1, H/CCL 1.2). SPEP with no M-spike.  - Echo 06/07/19: EF 60-65% grade II DD. RV ok.  NYHA II. Volume status stable. Optivol ok.  - On low dose carvedilol 3.125 bid - On spiro 12.5 mg daily.  - On torsemide 60/40 + metolazone weekly.  - No SGT2i with chronic yeast infections.  2) Obesity -Body mass index is 39.83 kg/m. - Discussed portion control.  3) HTN - Stable.  4) Orthostatic Hypotension.  - Neurology managing. She is also on diamox for increased ICP - Continue Florinef and midodrine to 10 mg TID.  5) Chronic Venous stasis - plan as above. Continue compression stockings and elevation 6) Falls  - Ongoing issue due to orthostasis and weakness. Improved. SeeingNeurology.  7) CKD 3 - Creatinine baseline 1.4-1.6 - Check BMET   Follow up in 3-4 months. Continue HF Paramedicine.   Rhonda Laidlaw NP-C  11:20 AM

## 2020-03-14 ENCOUNTER — Ambulatory Visit (HOSPITAL_COMMUNITY)
Admission: RE | Admit: 2020-03-14 | Discharge: 2020-03-14 | Disposition: A | Discharge status: Home / Self Care | Payer: Medicare HMO | Source: Ambulatory Visit | Attending: Adult Health | Admitting: Adult Health

## 2020-03-14 ENCOUNTER — Other Ambulatory Visit: Payer: Self-pay

## 2020-03-14 ENCOUNTER — Telehealth (HOSPITAL_COMMUNITY): Payer: Self-pay

## 2020-03-14 VITALS — BP 133/86 | HR 86 | Wt 210.8 lb

## 2020-03-14 DIAGNOSIS — I5022 Chronic systolic (congestive) heart failure: Secondary | ICD-10-CM | POA: Insufficient documentation

## 2020-03-14 DIAGNOSIS — I878 Other specified disorders of veins: Secondary | ICD-10-CM | POA: Insufficient documentation

## 2020-03-14 DIAGNOSIS — M549 Dorsalgia, unspecified: Secondary | ICD-10-CM | POA: Diagnosis not present

## 2020-03-14 DIAGNOSIS — N183 Chronic kidney disease, stage 3 unspecified: Secondary | ICD-10-CM | POA: Diagnosis not present

## 2020-03-14 DIAGNOSIS — Z6839 Body mass index (BMI) 39.0-39.9, adult: Secondary | ICD-10-CM | POA: Diagnosis not present

## 2020-03-14 DIAGNOSIS — I428 Other cardiomyopathies: Secondary | ICD-10-CM | POA: Diagnosis not present

## 2020-03-14 DIAGNOSIS — Z79899 Other long term (current) drug therapy: Secondary | ICD-10-CM | POA: Diagnosis not present

## 2020-03-14 DIAGNOSIS — I130 Hypertensive heart and chronic kidney disease with heart failure and stage 1 through stage 4 chronic kidney disease, or unspecified chronic kidney disease: Secondary | ICD-10-CM | POA: Insufficient documentation

## 2020-03-14 DIAGNOSIS — Z853 Personal history of malignant neoplasm of breast: Secondary | ICD-10-CM | POA: Diagnosis not present

## 2020-03-14 DIAGNOSIS — G932 Benign intracranial hypertension: Secondary | ICD-10-CM | POA: Diagnosis not present

## 2020-03-14 DIAGNOSIS — Z9981 Dependence on supplemental oxygen: Secondary | ICD-10-CM | POA: Insufficient documentation

## 2020-03-14 DIAGNOSIS — Z923 Personal history of irradiation: Secondary | ICD-10-CM | POA: Diagnosis not present

## 2020-03-14 DIAGNOSIS — Z9181 History of falling: Secondary | ICD-10-CM | POA: Diagnosis not present

## 2020-03-14 DIAGNOSIS — J9610 Chronic respiratory failure, unspecified whether with hypoxia or hypercapnia: Secondary | ICD-10-CM | POA: Diagnosis not present

## 2020-03-14 DIAGNOSIS — R531 Weakness: Secondary | ICD-10-CM | POA: Diagnosis not present

## 2020-03-14 DIAGNOSIS — I951 Orthostatic hypotension: Secondary | ICD-10-CM | POA: Diagnosis not present

## 2020-03-14 DIAGNOSIS — E1122 Type 2 diabetes mellitus with diabetic chronic kidney disease: Secondary | ICD-10-CM | POA: Insufficient documentation

## 2020-03-14 LAB — BASIC METABOLIC PANEL
Anion gap: 10 (ref 5–15)
BUN: 30 mg/dL — ABNORMAL HIGH (ref 8–23)
CO2: 30 mmol/L (ref 22–32)
Calcium: 9.2 mg/dL (ref 8.9–10.3)
Chloride: 99 mmol/L (ref 98–111)
Creatinine, Ser: 1.67 mg/dL — ABNORMAL HIGH (ref 0.44–1.00)
GFR, Estimated: 33 mL/min — ABNORMAL LOW (ref >60)
Glucose, Bld: 86 mg/dL (ref 70–99)
Potassium: 3.4 mmol/L — ABNORMAL LOW (ref 3.5–5.1)
Sodium: 139 mmol/L (ref 135–145)

## 2020-03-14 MED ORDER — POTASSIUM CHLORIDE CRYS ER 20 MEQ PO TBCR: 60.0000 meq | EXTENDED_RELEASE_TABLET | Freq: Three times a day (TID) | ORAL | 3 refills | Status: DC

## 2020-03-14 NOTE — Telephone Encounter (Signed)
Malena Edman, RN  03/14/2020 4:55 PM EDT Back to Top    Patient advised and verbalized understanding. Med list updated. Lab appt scheduled and lab orders placed

## 2020-03-14 NOTE — Patient Instructions (Signed)
It was great to see you today! No medication changes are needed at this time.  Labs today We will only contact you if something comes back abnormal or we need to make some changes. Otherwise no news is good news!  Your physician recommends that you schedule a follow-up appointment in: 3 months with [name],NP and in 6 months with Dr Haroldine Laws  If you have any questions or concerns before your next appointment please send [place] a message through [facility] or call our office at [phone].    TO LEAVE A MESSAGE FOR THE NURSE SELECT OPTION 2, PLEASE LEAVE A MESSAGE INCLUDING: . YOUR NAME . DATE OF BIRTH . CALL BACK NUMBER . REASON FOR CALL**this is important as we prioritize the call backs  YOU WILL RECEIVE A CALL BACK THE SAME DAY AS LONG AS YOU CALL BEFORE 4:00 PM

## 2020-03-14 NOTE — Progress Notes (Signed)
CSW consulted to meet with patient regarding outpatient mental health resources.  Pt already had a list of therapist that had been provided by another medical office but she has not made any calls yet.  Pt wanting direction on who to call.  Pt states she currently sees a psychiatrist but that she would like to change to a new one as she doesn't feel as if her current psychiatrist listens to her.  CSW indicated to patient which therapy offices offer both psychiatry/medication management as well as therapy services and encouraged her to start with those offices.   CSW will continue to follow and assist as needed  [name], [facility] Desk#: [phone] Cell#: [phone]

## 2020-03-14 NOTE — Telephone Encounter (Signed)
-----   Message from Conrad Clyman, NP sent at 03/14/2020  4:29 PM EDT ----- Increase K to 60 meq three times a day. BMET in 7 days

## 2020-03-15 ENCOUNTER — Ambulatory Visit
Admission: EM | Admit: 2020-03-15 | Discharge: 2020-03-15 | Disposition: A | Discharge status: Home / Self Care | Payer: Medicare HMO | Attending: Emergency Medicine | Admitting: Emergency Medicine

## 2020-03-15 DIAGNOSIS — Z20822 Contact with and (suspected) exposure to covid-19: Secondary | ICD-10-CM

## 2020-03-15 DIAGNOSIS — J320 Chronic maxillary sinusitis: Secondary | ICD-10-CM

## 2020-03-15 MED ORDER — AMOXICILLIN 500 MG PO CAPS: 500.0000 mg | ORAL_CAPSULE | Freq: Two times a day (BID) | ORAL | 0 refills | Status: AC

## 2020-03-15 NOTE — Discharge Instructions (Addendum)
Your COVID test is pending - it is important to quarantine / isolate at home until your results are back. °If you test positive and would like further evaluation for persistent or worsening symptoms, you may schedule an E-visit or virtual (video) visit throughout the [facility] MyChart app or website. ° °PLEASE NOTE: If you develop severe chest pain or shortness of breath please go to the ER or call 9-1-1 for further evaluation --> DO NOT schedule electronic or virtual visits for this. °Please call our office for further guidance / recommendations as needed. ° °For information about the Covid vaccine, please visit [email or web address] °

## 2020-03-15 NOTE — Addendum Note (Signed)
Addended by: Cheri Kearns A on: 03/15/2020 09:16 AM   Modules accepted: Level of Service

## 2020-03-15 NOTE — Progress Notes (Signed)
Remote ICD transmission.   

## 2020-03-15 NOTE — ED Triage Notes (Addendum)
Patient in with c/o ST, dry cough and burning sensation in throat. States that she thinks it's due to her recently starting back smoking cigarettes after stopping years ago because she is stressed. Has also ordered nicotine patches to help with smoking urge.   Has been taking nyquil for post nasal drip   Denies any sneezing, fever, n/v, cp, sob

## 2020-03-15 NOTE — ED Provider Notes (Signed)
[facility]    CSN: [id number] Arrival date & time: 03/15/20  1842      History   Chief Complaint Chief Complaint  Patient presents with  . Sore Throat    HPI Rhonda [name] is a 69 y.o. female  With extensive medical history as below presenting for sore throat, dry cough.  States dry cough attributed by patient to recently resuming smoking.  Has not former cardiologist of this.  Attempting to switch over to nicotine patches this weekend.  No chest pain, palpitations, difficulty breathing or fever.  No known sick contacts.  Describes throat as burning sensation: Worse in the morning.  Patient also notes nasal congestion with postnasal drip.  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  Has been having nasal congestion with postnasal drip for the last 5 weeks.  States antibiotics "are the only thing that make it go away ".  Past Medical History:  Diagnosis Date  . Anemia   . Arthritis    Right knee  . Asthma   . Back pain    Disk problem  . Breast cancer, stage 1 ([place]) 03/26/2011   Left; completed chemotherapy and radiation treatments  . Cardiomyopathy   . Chronic respiratory failure ([place]) 09/14/2013  . Chronic systolic heart failure (HCC)    a) NICM b) ECHO (03/2013) EF 20-25% c) ECHO (09/2013) EF 45-50%, grade I DD  . CKD (chronic kidney disease) stage 3, GFR 30-59 ml/min ([place]) 11/08/2018  . Complication of anesthesia    History of low blood pressure after surgery; attributed to lying flat  . Diabetes mellitus    "diet controlled" (05/03/2013)  . Exertional shortness of breath   . Generalized anxiety disorder   . GERD (gastroesophageal reflux disease)   . Gout   . Heart murmur   . Hyperlipidemia   . Hypertension   . Hypokalemia 11/08/2018  . Hypothyroidism   . Left bundle branch block    s/p CRT-D (04/2013)  . Major depressive disorder   . Migraines   . On home oxygen therapy    "2L suppose to be q night" (05/03/2013)  .  Orthostatic hypotension 07/28/2017  . Peripheral neuropathy    Feet  . SVD (spontaneous vaginal delivery)    x 2  . Unspecified vitamin D deficiency 03/26/2011   Does not take meds    Patient Active Problem List   Diagnosis Date Noted  . Diabetes mellitus ([place]) 06/15/2019  . Hypokalemia 11/08/2018  . CKD (chronic kidney disease) stage 3, GFR 30-59 ml/min (HCC) 11/08/2018  . Orthostatic hypotension 07/28/2017  . SVD (spontaneous vaginal delivery)   . Peripheral neuropathy   . On home oxygen therapy   . Migraines   . Left bundle branch block   . Hypothyroidism   . Hypertension   . Hyperlipidemia   . Heart murmur   . GERD (gastroesophageal reflux disease)   . Exertional shortness of breath   . Major depressive disorder   . Back pain   . Arthritis   . Generalized anxiety disorder   . Anemia   . Chronic respiratory failure ([place]) 09/14/2013  . 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 Morbid obesity ([place]) 11/01/2012  . Chronic systolic heart failure ([place]) 10/27/2012  . Endometrial polyp 01/20/2012  . Malignant tumor of breast ([place]) 03/26/2011  . Unspecified vitamin D deficiency 03/26/2011    Past Surgical History:  Procedure Laterality Date  . BI-VENTRICULAR IMPLANTABLE CARDIOVERTER DEFIBRILLATOR N/A  05/03/2013   Procedure: BI-VENTRICULAR IMPLANTABLE CARDIOVERTER DEFIBRILLATOR  (CRT-D);  Surgeon: Evans Lance, MD;  Location: [facility] CATH LAB;  Service: Cardiovascular;  Laterality: N/A;  . BI-VENTRICULAR IMPLANTABLE CARDIOVERTER DEFIBRILLATOR  (CRT-D)  05/03/2013   MDT CRTD implanted by Dr Lovena Le for non ischemic cardiomyopathy  . BIOPSY  05/31/2018   Procedure: BIOPSY;  Surgeon: Ronnette Juniper, MD;  Location: [facility];  Service: Gastroenterology;;  . BREAST LUMPECTOMY Left 07/28/2010  . CARDIAC CATHETERIZATION  2010   NORMAL CORONARY ARTERIES  . 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--11 SURGERIES     PT HAS REMOVABLE SPEECH BULB-TAKES IT OUT  BEFORE HER SURGERY  . COLONOSCOPY    . COLONOSCOPY N/A 05/31/2018   Procedure: COLONOSCOPY;  Surgeon: Ronnette Juniper, MD;  Location: [facility];  Service: Gastroenterology;  Laterality: N/A;  . HYSTEROSCOPY WITH D & C  01/20/2012   Procedure: DILATATION AND CURETTAGE /HYSTEROSCOPY;  Surgeon: Margarette Asal, MD;  Location: [facility];  Service: Gynecology;  Laterality: N/A;  with trueclear  . PORT-A-CATH REMOVAL  09/23/2011   Procedure: REMOVAL PORT-A-CATH;  Surgeon: Rolm Bookbinder, MD;  Location: [facility];  Service: General;  Laterality: N/A;  Port Removal  . PORTACATH PLACEMENT  2012  . TONSILLECTOMY  1960's  . TOTAL KNEE ARTHROPLASTY  05/10/2012   Procedure: TOTAL KNEE ARTHROPLASTY;  Surgeon: Mauri Pole, MD;  Location: [facility];  Service: Orthopedics;  Laterality: Right;  . WISDOM TOOTH EXTRACTION      OB History   No obstetric history on file.      Home Medications    Prior to Admission medications   Medication Sig Start Date End Date Taking? Authorizing Provider  acetaZOLAMIDE (DIAMOX) 125 MG tablet TAKE 1 TABLET(125 MG) BY MOUTH DAILY 02/29/20   Narda Amber K, DO  amoxicillin (AMOXIL) 500 MG capsule Take 1 capsule (500 mg total) by mouth 2 (two) times daily for 7 days. 03/15/20 03/22/20  Hall-Potvin, [place], PA-C  carvedilol (COREG) 3.125 MG tablet TAKE 1 TABLET(3.125 MG) BY MOUTH TWICE DAILY WITH A MEAL 09/01/19   Bensimhon, Shaune Pascal, MD  citalopram (CELEXA) 20 MG tablet Take 20 mg by mouth daily.    [provider]  dicyclomine (BENTYL) 20 MG tablet Take 20 mg by mouth every 6 (six) hours.    [provider]  DULoxetine (CYMBALTA) 30 MG capsule Take 30 mg by mouth daily. Take once in the morning.    Norma Fredrickson, MD  fexofenadine (ALLEGRA) 180 MG tablet Take 180 mg by mouth 2 (two) times daily.     [provider]  fludrocortisone (FLORINEF) 0.1 MG tablet TAKE 1 TABLET(0.1 MG) BY MOUTH TWICE DAILY 07/11/19   Narda Amber K, DO  gabapentin (NEURONTIN)  300 MG capsule Take 300 mg by mouth 3 (three) times daily.     [provider]  gabapentin (NEURONTIN) 800 MG tablet Take 1 tablet (800 mg total) by mouth at bedtime as needed (neuropathy). 12/07/19   Patel, Arvin Collard K, DO  Ginkgo Biloba 120 MG TABS Take by mouth.    [provider]  hydrOXYzine (ATARAX/VISTARIL) 25 MG tablet Take 25 mg by mouth 2 (two) times daily. Takes Morning and Bedtime 02/27/20   Plovsky, Berneta Sages, MD  hydrOXYzine (VISTARIL) 25 MG capsule Take 25 mg by mouth 3 (three) times daily as needed.    [provider]  ipratropium (ATROVENT) 0.03 % nasal spray Place 2 sprays into both nostrils 2 (two) times daily.    [provider]  levothyroxine (SYNTHROID) 88 MCG tablet TAKE 1 TABLET(88 MCG) BY MOUTH DAILY BEFORE BREAKFAST 02/12/20   Philemon Kingdom, MD  metolazone (ZAROXOLYN) 2.5 MG tablet Take 1 tablet by mouth every Tuesday 11/08/19   Bensimhon, Shaune Pascal, MD  midodrine (PROAMATINE) 10 MG tablet Take 1 tablet (10 mg total) by mouth 3 (three) times daily with meals. 09/01/19   Bensimhon, Shaune Pascal, MD  mirtazapine (REMERON) 30 MG tablet TAKE 1 TABLET BY MOUTH EVERY NIGHT AT BEDTIME 10/18/19   Plovsky, Berneta Sages, MD  montelukast (SINGULAIR) 10 MG tablet Take 10 mg by mouth at bedtime.  09/28/16   [provider]  omeprazole (PRILOSEC) 40 MG capsule Take 40 mg by mouth daily.    [provider]  potassium chloride SA (KLOR-CON) 20 MEQ tablet Take 3 tablets (60 mEq total) by mouth 3 (three) times daily. Take an extra 58meq(2 tablets) on the days you take the metolazone. 03/14/20   Clegg, Amy D, NP  probenecid (BENEMID) 500 MG tablet Take 500 mg by mouth 2 (two) times daily.    [provider]  simvastatin (ZOCOR) 10 MG tablet Take 10 mg by mouth every evening.     [provider]  spironolactone (ALDACTONE) 25 MG tablet Take 1 tablet (25 mg total) by mouth every evening. 11/23/19   Clegg, Amy D, NP  torsemide (DEMADEX) 20 MG tablet  Take 3 tablets (60 mg total) by mouth 2 (two) times daily. 09/07/19   Clegg, Amy D, NP  traMADol (ULTRAM) 50 MG tablet Take by mouth every 6 (six) hours as needed.    [provider]    Family History Family History  Problem Relation Age of Onset  . Alzheimer's disease Mother   . CVA Mother   . Hypertension Mother   . Cancer - Colon Father        late 12  . Birth defects Paternal Uncle     Social History Social History   Tobacco Use  . Smoking status: Current Some Day Smoker    Packs/day: 0.10    Years: 26.00    Pack years: 2.60    Types: Cigarettes    Last attempt to quit: 05/06/2007    Years since quitting: 12.8  . Smokeless tobacco: Never Used  Vaping Use  . Vaping Use: Never used  Substance Use Topics  . Alcohol use: No  . Drug use: No     Allergies   Ceftin, Geodon [ziprasidone hcl], Lisinopril, Shellfish allergy, Allopurinol, Ativan [lorazepam], Sulfa antibiotics, Ultram [tramadol hcl], and Valium [diazepam]   Review of Systems As per HPI   Physical Exam Triage Vital Signs ED Triage Vitals  Enc Vitals Group     BP      Pulse      Resp      Temp      Temp src      SpO2      Weight      Height      Head Circumference      [place]ak Flow      Pain Score      Pain Loc      Pain Edu?      Excl. in [place]?    No data found.  Updated Vital Signs BP 138/81 (BP Location: Left Arm)   Pulse 77   Temp 97.7 F (36.5 C) (Oral)   Resp 20   SpO2 98%   Visual Acuity Right Eye Distance:   Left Eye Distance:   Bilateral  Distance:    Right Eye Near:   Left Eye Near:    Bilateral Near:     Physical Exam Constitutional:      General: She is not in acute distress.    Appearance: She is not ill-appearing or diaphoretic.  HENT:     Head: Normocephalic and atraumatic.     Nose:     Comments: Cleft palate deformity noted    Mouth/Throat:     Mouth: Mucous membranes are moist.     Pharynx: Oropharynx is clear. No oropharyngeal exudate or posterior  oropharyngeal erythema.  Eyes:     General: No scleral icterus.    Conjunctiva/sclera: Conjunctivae normal.     Pupils: Pupils are equal, round, and reactive to light.  Neck:     Comments: Trachea midline, negative JVD Cardiovascular:     Rate and Rhythm: Normal rate and regular rhythm.     Heart sounds: No murmur heard.  No gallop.   Pulmonary:     Effort: Pulmonary effort is normal. No respiratory distress.     Breath sounds: No wheezing, rhonchi or rales.  Musculoskeletal:     Cervical back: Neck supple. No tenderness.  Lymphadenopathy:     Cervical: No cervical adenopathy.  Skin:    Capillary Refill: Capillary refill takes less than 2 seconds.     Coloration: Skin is not jaundiced or pale.     Findings: No rash.  Neurological:     General: No focal deficit present.     Mental Status: She is alert and oriented to person, place, and time.      UC Treatments / Results  Labs (all labs ordered are listed, but only abnormal results are displayed) Labs Reviewed  NOVEL CORONAVIRUS, NAA    EKG   Radiology No results found.  Procedures Procedures (including critical care time)  Medications Ordered in UC Medications - No data to display  Initial Impression / Assessment and Plan / UC Course  I have reviewed the triage vital signs and the nursing notes.  Pertinent labs & imaging results that were available during my care of the patient were reviewed by me and considered in my medical decision making (see chart for details).     Patient afebrile, nontoxic, with SpO2 98%.  Covid PCR pending.  Patient to quarantine until results are back.  We will treat supportively as outlined below and cover for chronic sinusitis, bacterial component, as below given cleft palate deformity with chronic sinus congestion, nasal pressure, postnasal drip.  Return precautions discussed, patient verbalized understanding and is agreeable to plan. Final Clinical Impressions(s) / UC Diagnoses    Final diagnoses:  Encounter for screening laboratory testing for COVID-19 virus  Chronic maxillary sinusitis     Discharge Instructions     Your COVID test is pending - it is important to quarantine / isolate at home until your results are back. If you test positive and would like further evaluation for persistent or worsening symptoms, you may schedule an E-visit or virtual (video) visit throughout the [facility] app or website.  PLEASE NOTE: If you develop severe chest pain or shortness of breath please go to the ER or call [phone] for further evaluation --> DO NOT schedule electronic or virtual visits for this. Please call our office for further guidance / recommendations as needed.  For information about the Covid vaccine, please visit [email or web address]    ED Prescriptions    Medication Sig Dispense Auth. Provider   amoxicillin (AMOXIL) 500  MG capsule Take 1 capsule (500 mg total) by mouth 2 (two) times daily for 7 days. 14 capsule Hall-Potvin, [place], PA-C     PDMP not reviewed this encounter.   Neldon Mc [place], [place] 03/15/20 1955

## 2020-03-16 ENCOUNTER — Other Ambulatory Visit: Payer: Self-pay | Admitting: Neurology

## 2020-03-16 LAB — SARS-COV-2, NAA 2 DAY TAT

## 2020-03-16 LAB — NOVEL CORONAVIRUS, NAA: SARS-CoV-2, NAA: NOT DETECTED

## 2020-03-19 ENCOUNTER — Telehealth: Payer: Self-pay | Admitting: Internal Medicine

## 2020-03-19 ENCOUNTER — Other Ambulatory Visit (HOSPITAL_COMMUNITY): Payer: Self-pay

## 2020-03-19 NOTE — Progress Notes (Signed)
Paramedicine Encounter    Patient ID: Rhonda [name], female    DOB: 11-24-1950, 69 y.o.   MRN: [id number]   Patient Care Team: Maurice Small, MD as PCP - General (Family Medicine) [name], LCSW as Social Worker (Licensed Clinical Social Worker) [name], [name], DO as Consulting Physician (Neurology)  Patient Active Problem List   Diagnosis Date Noted  . Diabetes mellitus ([place]) 06/15/2019  . Hypokalemia 11/08/2018  . CKD (chronic kidney disease) stage 3, GFR 30-59 ml/min (HCC) 11/08/2018  . Orthostatic hypotension 07/28/2017  . SVD (spontaneous vaginal delivery)   . Peripheral neuropathy   . On home oxygen therapy   . Migraines   . Left bundle branch block   . Hypothyroidism   . Hypertension   . Hyperlipidemia   . Heart murmur   . GERD (gastroesophageal reflux disease)   . Exertional shortness of breath   . Major depressive disorder   . Back pain   . Arthritis   . Generalized anxiety disorder   . Anemia   . Chronic respiratory failure ([place]) 09/14/2013  . 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 Morbid obesity ([place]) 11/01/2012  . Chronic systolic heart failure ([place]) 10/27/2012  . Endometrial polyp 01/20/2012  . Malignant tumor of breast ([place]) 03/26/2011  . Unspecified vitamin D deficiency 03/26/2011    Current Outpatient Medications:  .  acetaZOLAMIDE (DIAMOX) 125 MG tablet, TAKE 1 TABLET(125 MG) BY MOUTH DAILY, Disp: 90 tablet, Rfl: 3 .  amoxicillin (AMOXIL) 500 MG capsule, Take 1 capsule (500 mg total) by mouth 2 (two) times daily for 7 days., Disp: 14 capsule, Rfl: 0 .  carvedilol (COREG) 3.125 MG tablet, TAKE 1 TABLET(3.125 MG) BY MOUTH TWICE DAILY WITH A MEAL, Disp: 180 tablet, Rfl: 3 .  citalopram (CELEXA) 20 MG tablet, Take 20 mg by mouth daily., Disp: , Rfl:  .  dicyclomine (BENTYL) 20 MG tablet, Take 20 mg by mouth every 6 (six) hours., Disp: , Rfl:  .  DULoxetine (CYMBALTA) 30 MG capsule, Take 30 mg by mouth daily.  Take once in the morning., Disp: , Rfl:  .  fexofenadine (ALLEGRA) 180 MG tablet, Take 180 mg by mouth 2 (two) times daily. , Disp: , Rfl:  .  fludrocortisone (FLORINEF) 0.1 MG tablet, TAKE 1 TABLET(0.1 MG) BY MOUTH TWICE DAILY, Disp: 60 tablet, Rfl: 3 .  gabapentin (NEURONTIN) 300 MG capsule, Take 300 mg by mouth 3 (three) times daily. , Disp: , Rfl:  .  gabapentin (NEURONTIN) 800 MG tablet, Take 1 tablet (800 mg total) by mouth at bedtime as needed (neuropathy)., Disp: 90 tablet, Rfl: 1 .  Ginkgo Biloba 120 MG TABS, Take by mouth., Disp: , Rfl:  .  hydrOXYzine (ATARAX/VISTARIL) 25 MG tablet, Take 25 mg by mouth 2 (two) times daily. Takes Morning and Bedtime, Disp: , Rfl:  .  hydrOXYzine (VISTARIL) 25 MG capsule, Take 25 mg by mouth 3 (three) times daily as needed., Disp: , Rfl:  .  ipratropium (ATROVENT) 0.03 % nasal spray, Place 2 sprays into both nostrils 2 (two) times daily., Disp: , Rfl:  .  levothyroxine (SYNTHROID) 88 MCG tablet, TAKE 1 TABLET(88 MCG) BY MOUTH DAILY BEFORE BREAKFAST, Disp: 90 tablet, Rfl: 1 .  metolazone (ZAROXOLYN) 2.5 MG tablet, Take 1 tablet by mouth every Tuesday, Disp: 12 tablet, Rfl: 3 .  midodrine (PROAMATINE) 10 MG tablet, Take 1 tablet (10 mg total) by mouth 3 (three) times daily with meals., Disp: 270 tablet, Rfl: 3 .  mirtazapine (REMERON) 30 MG tablet, TAKE 1 TABLET BY MOUTH EVERY NIGHT AT BEDTIME, Disp: 30 tablet, Rfl: 3 .  montelukast (SINGULAIR) 10 MG tablet, Take 10 mg by mouth at bedtime. , Disp: , Rfl: 1 .  omeprazole (PRILOSEC) 40 MG capsule, Take 40 mg by mouth daily., Disp: , Rfl:  .  potassium chloride SA (KLOR-CON) 20 MEQ tablet, Take 3 tablets (60 mEq total) by mouth 3 (three) times daily. Take an extra 39meq(2 tablets) on the days you take the metolazone., Disp: 300 tablet, Rfl: 3 .  probenecid (BENEMID) 500 MG tablet, Take 500 mg by mouth 2 (two) times daily., Disp: , Rfl:  .  simvastatin (ZOCOR) 10 MG tablet, Take 10 mg by mouth every evening. ,  Disp: , Rfl:  .  spironolactone (ALDACTONE) 25 MG tablet, Take 1 tablet (25 mg total) by mouth every evening., Disp: 90 tablet, Rfl: 3 .  torsemide (DEMADEX) 20 MG tablet, Take 3 tablets (60 mg total) by mouth 2 (two) times daily., Disp: 540 tablet, Rfl: 3 .  traMADol (ULTRAM) 50 MG tablet, Take by mouth every 6 (six) hours as needed., Disp: , Rfl:  Allergies  Allergen Reactions  . Ceftin Anaphylaxis    Face and throat swell   . Geodon [Ziprasidone Hcl] Hives  . Lisinopril Other (See Comments)    angioedema  . Shellfish Allergy Other (See Comments)    Gout exacerbation  . Allopurinol Nausea Only and Other (See Comments)    weakness  . Ativan [Lorazepam] Itching  . Sulfa Antibiotics Itching  . Ultram [Tramadol Hcl] Itching  . Valium [Diazepam] Other (See Comments)    Patient states that diazepam doesn't relax, it has the opposite effect.     Social History   Socioeconomic History  . Marital status: Married    Spouse name: Not on file  . Number of children: 2  . Years of education: 62  . Highest education level: Master's degree (e.g., MA, MS, MEng, MEd, MSW, MBA)  Occupational History  . Occupation: retired  Tobacco Use  . Smoking status: Current Some Day Smoker    Packs/day: 0.10    Years: 26.00    Pack years: 2.60    Types: Cigarettes    Last attempt to quit: 05/06/2007    Years since quitting: 12.8  . Smokeless tobacco: Never Used  Vaping Use  . Vaping Use: Never used  Substance and Sexual Activity  . Alcohol use: No  . Drug use: No  . Sexual activity: Yes  Other Topics Concern  . Not on file  Social History Narrative   Tobacco Use Cigarettes: Former Smoker, Quit in 2008   No Alcohol   No recreational drug use   Diet: Regular/Low Carb   Exercise: None   Occupation: disabled   Education: Research officer, political party, masters   Children: 2   Firearms: No   Therapist, art Use: 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      Social Determinants of Health    Financial Resource Strain:   . Difficulty of Paying Living Expenses: Not on file  Food Insecurity:   . Worried About Charity fundraiser in the Last Year: Not on file  . Ran Out of Food in the Last Year: Not on file  Transportation Needs:   . Lack of Transportation (Medical): Not on file  . Lack of Transportation (Non-Medical): Not on file  Physical Activity:   . Days of  Exercise per Week: Not on file  . Minutes of Exercise per Session: Not on file  Stress:   . Feeling of Stress : Not on file  Social Connections:   . Frequency of Communication with Friends and Family: Not on file  . Frequency of Social Gatherings with Friends and Family: Not on file  . Attends Religious Services: Not on file  . Active Member of Clubs or Organizations: Not on file  . Attends Archivist Meetings: Not on file  . Marital Status: Not on file  Intimate Partner Violence:   . Fear of Current or Ex-Partner: Not on file  . Emotionally Abused: Not on file  . Physically Abused: Not on file  . Sexually Abused: Not on file    Physical Exam Vitals reviewed.  Constitutional:      Appearance: She is normal weight.  HENT:     Head: Normocephalic.     Nose: Nose normal.     Mouth/Throat:     Mouth: Mucous membranes are moist.     Pharynx: Oropharynx is clear.  Eyes:     Pupils: Pupils are equal, round, and reactive to light.  Cardiovascular:     Rate and Rhythm: Normal rate and regular rhythm.     Pulses: Normal pulses.     Heart sounds: Normal heart sounds.  Pulmonary:     Effort: Pulmonary effort is normal.     Breath sounds: Normal breath sounds.  Abdominal:     General: Abdomen is flat.     Palpations: Abdomen is soft.  Musculoskeletal:        General: Swelling present. Normal range of motion.     Cervical back: Normal range of motion.     Right lower leg: Edema present.     Left lower leg: Edema present.     Comments: Bilateral leg pain.  Skin:    General: Skin is warm and dry.      Capillary Refill: Capillary refill takes less than 2 seconds.  Neurological:     General: No focal deficit present.     Mental Status: She is alert. Mental status is at baseline.  Psychiatric:        Mood and Affect: Mood normal.    Arrived for home visit for Rhonda [name] who was alert and oriented reporting she was having some of her chronic bilateral leg pain. Rhonda [name] reports she had some sinus infection symptoms last week and she was seen in an urgent care where they prescribed Amoxicillin for same. Patient will finish this on Friday morning. Vitals were obtained. No weight changes this week. Legs were swollen with edema as per normal for Rhonda [name]. Rhonda [name] expressed that her PCP recommended getting her ICD battery replaced soon and scheduling her spinal procedure. I assisted Rhonda [name] in rescheduling her ICD Battery change out but am awaiting phone call from Dr. Marcello Moores (spinal surgeons) office. Medications were verified and confirmed. Pill box filled accordingly. Home visit complete. I will continue to assist and next visit planned for one week.   Refills:  Hydroxyzine  Allegra      Future Appointments  Date Time Provider [place]  03/27/2020  2:45 PM [facility] LAB [facility] None  04/04/2020  3:45 PM Landis Martins, DPM [facility]  04/11/2020 11:20 AM [facility] DEVICE REMOTES [facility]  04/23/2020  8:00 AM [facility] LAB [facility]  04/23/2020  8:35 AM [facility]ENING [facility] None  05/07/2020 12:00 PM [facility] DEVICE 1 [facility]  05/09/2020  5:00 PM [facility]  DEVICE REMOTES [facility]  05/13/2020  2:00 PM Desenglau, Tommy Rainwater, PT [facility]  05/20/2020 12:30 PM Desenglau, Tommy Rainwater, PT [facility]  06/12/2020 10:00 AM Philemon Kingdom, MD [facility] None  08/08/2020  5:00 PM [facility] DEVICE REMOTES [facility]  11/07/2020  5:00 PM [facility] DEVICE REMOTES [facility]   02/06/2021  5:00 PM [facility] DEVICE REMOTES [facility]  02/27/2021  9:50 AM [name], Donika K, DO [facility] None  05/08/2021  5:00 PM [facility] DEVICE REMOTES [facility]  06/09/2021  5:00 PM [facility] DEVICE REMOTES [facility]  07/09/2021  5:00 PM [facility] DEVICE REMOTES [facility]     ACTION: Home visit completed Next visit planned for one week

## 2020-03-19 NOTE — Telephone Encounter (Signed)
[name] from [facility] called to speak with Dr. Tanna Furry nurse to see if there is any possibility to move the procedure scheduled for 04/25/2020 to a sooner date. The patient has spinal stenoses and is attempting to schedule surgery with Dr. Marcello Moores at [facility] but the procedure scheduled for 04/25/2020 must happen before she can have another procedure.   Dr. Marcello Moores at [facility] : [phone].  Please call/advise [name] at [phone] in regards to rescheduling Dr. Tanna Furry procedure for the patient.

## 2020-03-20 NOTE — Telephone Encounter (Signed)
Pt procedure rescheduled per Pt request for 04/11/20  Labs/covid test rescheduled  Work up complete.

## 2020-03-26 ENCOUNTER — Other Ambulatory Visit (HOSPITAL_COMMUNITY): Payer: Self-pay

## 2020-03-26 NOTE — Progress Notes (Signed)
Paramedicine Encounter    Patient ID: Rhonda [name], female    DOB: 18-Aug-1950, 69 y.o.   MRN: [id number]   Patient Care Team: Maurice Small, MD as PCP - General (Family Medicine) [name], LCSW as Social Worker (Licensed Clinical Social Worker) [name], [name], DO as Consulting Physician (Neurology)  Patient Active Problem List   Diagnosis Date Noted  . Diabetes mellitus ([place]) 06/15/2019  . Hypokalemia 11/08/2018  . CKD (chronic kidney disease) stage 3, GFR 30-59 ml/min (HCC) 11/08/2018  . Orthostatic hypotension 07/28/2017  . SVD (spontaneous vaginal delivery)   . Peripheral neuropathy   . On home oxygen therapy   . Migraines   . Left bundle branch block   . Hypothyroidism   . Hypertension   . Hyperlipidemia   . Heart murmur   . GERD (gastroesophageal reflux disease)   . Exertional shortness of breath   . Major depressive disorder   . Back pain   . Arthritis   . Generalized anxiety disorder   . Anemia   . Chronic respiratory failure ([place]) 09/14/2013  . 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 Morbid obesity ([place]) 11/01/2012  . Chronic systolic heart failure ([place]) 10/27/2012  . Endometrial polyp 01/20/2012  . Malignant tumor of breast ([place]) 03/26/2011  . Unspecified vitamin D deficiency 03/26/2011    Current Outpatient Medications:  .  acetaZOLAMIDE (DIAMOX) 125 MG tablet, TAKE 1 TABLET(125 MG) BY MOUTH DAILY, Disp: 90 tablet, Rfl: 3 .  carvedilol (COREG) 3.125 MG tablet, TAKE 1 TABLET(3.125 MG) BY MOUTH TWICE DAILY WITH A MEAL, Disp: 180 tablet, Rfl: 3 .  citalopram (CELEXA) 20 MG tablet, Take 20 mg by mouth daily., Disp: , Rfl:  .  dicyclomine (BENTYL) 20 MG tablet, Take 20 mg by mouth every 6 (six) hours., Disp: , Rfl:  .  DULoxetine (CYMBALTA) 30 MG capsule, Take 30 mg by mouth daily. Take once in the morning., Disp: , Rfl:  .  fexofenadine (ALLEGRA) 180 MG tablet, Take 180 mg by mouth 2 (two) times daily. , Disp: , Rfl:   .  fludrocortisone (FLORINEF) 0.1 MG tablet, TAKE 1 TABLET(0.1 MG) BY MOUTH TWICE DAILY, Disp: 60 tablet, Rfl: 3 .  gabapentin (NEURONTIN) 300 MG capsule, Take 300 mg by mouth 3 (three) times daily. , Disp: , Rfl:  .  gabapentin (NEURONTIN) 800 MG tablet, Take 1 tablet (800 mg total) by mouth at bedtime as needed (neuropathy)., Disp: 90 tablet, Rfl: 1 .  Ginkgo Biloba 120 MG TABS, Take by mouth., Disp: , Rfl:  .  hydrOXYzine (ATARAX/VISTARIL) 25 MG tablet, Take 25 mg by mouth 2 (two) times daily. Takes Morning and Bedtime, Disp: , Rfl:  .  hydrOXYzine (VISTARIL) 25 MG capsule, Take 25 mg by mouth 3 (three) times daily as needed., Disp: , Rfl:  .  ipratropium (ATROVENT) 0.03 % nasal spray, Place 2 sprays into both nostrils 2 (two) times daily., Disp: , Rfl:  .  levothyroxine (SYNTHROID) 88 MCG tablet, TAKE 1 TABLET(88 MCG) BY MOUTH DAILY BEFORE BREAKFAST, Disp: 90 tablet, Rfl: 1 .  metolazone (ZAROXOLYN) 2.5 MG tablet, Take 1 tablet by mouth every Tuesday, Disp: 12 tablet, Rfl: 3 .  midodrine (PROAMATINE) 10 MG tablet, Take 1 tablet (10 mg total) by mouth 3 (three) times daily with meals., Disp: 270 tablet, Rfl: 3 .  mirtazapine (REMERON) 30 MG tablet, TAKE 1 TABLET BY MOUTH EVERY NIGHT AT BEDTIME, Disp: 30 tablet, Rfl: 3 .  montelukast (SINGULAIR) 10 MG  tablet, Take 10 mg by mouth at bedtime. , Disp: , Rfl: 1 .  omeprazole (PRILOSEC) 40 MG capsule, Take 40 mg by mouth daily., Disp: , Rfl:  .  potassium chloride SA (KLOR-CON) 20 MEQ tablet, Take 3 tablets (60 mEq total) by mouth 3 (three) times daily. Take an extra 59meq(2 tablets) on the days you take the metolazone., Disp: 300 tablet, Rfl: 3 .  probenecid (BENEMID) 500 MG tablet, Take 500 mg by mouth 2 (two) times daily., Disp: , Rfl:  .  simvastatin (ZOCOR) 10 MG tablet, Take 10 mg by mouth every evening. , Disp: , Rfl:  .  spironolactone (ALDACTONE) 25 MG tablet, Take 1 tablet (25 mg total) by mouth every evening., Disp: 90 tablet, Rfl: 3 .   torsemide (DEMADEX) 20 MG tablet, Take 3 tablets (60 mg total) by mouth 2 (two) times daily., Disp: 540 tablet, Rfl: 3 .  traMADol (ULTRAM) 50 MG tablet, Take by mouth every 6 (six) hours as needed., Disp: , Rfl:  Allergies  Allergen Reactions  . Ceftin Anaphylaxis    Face and throat swell   . Geodon [Ziprasidone Hcl] Hives  . Lisinopril Other (See Comments)    angioedema  . Shellfish Allergy Other (See Comments)    Gout exacerbation  . Allopurinol Nausea Only and Other (See Comments)    weakness  . Ativan [Lorazepam] Itching  . Sulfa Antibiotics Itching  . Ultram [Tramadol Hcl] Itching  . Valium [Diazepam] Other (See Comments)    Patient states that diazepam doesn't relax, it has the opposite effect.     Social History   Socioeconomic History  . Marital status: Married    Spouse name: Not on file  . Number of children: 2  . Years of education: 64  . Highest education level: Master's degree (e.g., MA, MS, MEng, MEd, MSW, MBA)  Occupational History  . Occupation: retired  Tobacco Use  . Smoking status: Current Some Day Smoker    Packs/day: 0.10    Years: 26.00    Pack years: 2.60    Types: Cigarettes    Last attempt to quit: 05/06/2007    Years since quitting: 12.8  . Smokeless tobacco: Never Used  Vaping Use  . Vaping Use: Never used  Substance and Sexual Activity  . Alcohol use: No  . Drug use: No  . Sexual activity: Yes  Other Topics Concern  . 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 Difficulty of Paying Living Expenses: Not on file  Food Insecurity:   . Worried About Sales executive in the Last Year: Not on file  . Ran Out of Food in the Last Year: Not on file  Transportation Needs:   . Lack of Transportation (Medical): Not on file  . Lack of Transportation (Non-Medical): Not on file  Physical Activity:   . Days of Exercise per Week: Not on file  . Minutes of Exercise per Session: Not on file  Stress:   . Feeling of Stress :  Not on file  Social Connections:   . Frequency of Communication with Friends and Family: Not on file  . Frequency of Social Gatherings with Friends and Family: Not on file  . Attends Religious Services: Not on file  . Active Member of Clubs or Organizations: Not on file  . Attends Archivist Meetings: Not on file  . Marital Status: Not on file  Intimate Partner Violence:   . Fear of Current or Ex-Partner: Not on file  . Emotionally Abused: Not on file  . Physically Abused: Not on file  . Sexually Abused: Not on file    Physical Exam Vitals reviewed.  Constitutional:      Appearance: She is normal weight.  HENT:     Head: Normocephalic.     Nose: Nose normal.     Mouth/Throat:     Mouth: Mucous membranes are moist.     Pharynx: Oropharynx is clear.  Eyes:     Pupils: Pupils are equal, round, and reactive to light.  Cardiovascular:     Rate and Rhythm: Normal rate and regular rhythm.     Pulses: Normal pulses.     Heart sounds: Normal heart sounds.  Pulmonary:     Effort: Pulmonary effort is normal.     Breath sounds: Normal breath sounds.  Abdominal:     General: Abdomen is flat.     Palpations: Abdomen is soft.  Musculoskeletal:        General: Swelling present. Normal range of motion.     Cervical back: Normal range of motion.     Right lower leg: No edema.     Left lower leg: No edema.  Skin:    General: Skin is warm.     Capillary Refill: Capillary refill takes less than 2 seconds.  Neurological:     General: No focal deficit present.     Mental Status: She is alert. Mental status is at baseline.   Psychiatric:        Mood and Affect: Mood normal.     Arrived for home visit for Floriene who was alert and oriented reporting feeling well just tired from taking her pain medication due to leg pain. Vitals were obtained and are as noted. Medications were reviewed and 2 pill boxes filled accordingly. I reviewed appointments with [name] who agreed with same and calendar was filled out accordingly. Home visit complete. I will see Dareth in two weeks due to my absence next week. [name] understood and agreed.    Refills: Allegra Gabapentin 800    Future Appointments  Date Time Provider [place]  03/27/2020  2:45 PM [facility] None  04/04/2020  3:45 PM Landis Martins, DPM [facility]  04/09/2020  8:00 AM [facility]  04/09/2020  8:35 AM [facility] None  04/11/2020 11:20 AM [facility] DEVICE REMOTES [facility]  04/25/2020 12:00 PM [facility] DEVICE 1 [facility]  05/09/2020  5:00 PM [facility] DEVICE REMOTES [facility]  05/13/2020  2:00 PM Desenglau, Tommy Rainwater, PT [facility]  05/20/2020 12:30 PM Desenglau, Tommy Rainwater, PT [facility]  06/12/2020 10:00 AM Philemon Kingdom, MD [facility] None  07/25/2020  2:00 PM Evans Lance, MD [facility]  08/08/2020  5:00 PM [facility] DEVICE REMOTES [facility]  11/07/2020  5:00 PM [facility] DEVICE REMOTES [facility]  02/06/2021  5:00 PM [facility] DEVICE REMOTES [facility]  02/27/2021  9:50 AM Narda Amber K, DO [facility] None  05/08/2021  5:00 PM [facility] DEVICE  REMOTES [facility]  06/09/2021  5:00 PM [facility] DEVICE REMOTES [facility]  07/09/2021  5:00 PM [facility] DEVICE REMOTES [facility]     ACTION: Home visit completed Next visit planned for one week

## 2020-03-27 ENCOUNTER — Ambulatory Visit (HOSPITAL_COMMUNITY)
Admission: RE | Admit: 2020-03-27 | Discharge: 2020-03-27 | Disposition: A | Discharge status: Home / Self Care | Payer: Medicare HMO | Source: Ambulatory Visit | Attending: Adult Health | Admitting: Adult Health

## 2020-03-27 ENCOUNTER — Other Ambulatory Visit: Payer: Self-pay

## 2020-03-27 DIAGNOSIS — I5022 Chronic systolic (congestive) heart failure: Secondary | ICD-10-CM | POA: Diagnosis not present

## 2020-03-27 LAB — BASIC METABOLIC PANEL
Anion gap: 12 (ref 5–15)
BUN: 33 mg/dL — ABNORMAL HIGH (ref 8–23)
CO2: 28 mmol/L (ref 22–32)
Calcium: 9.5 mg/dL (ref 8.9–10.3)
Chloride: 101 mmol/L (ref 98–111)
Creatinine, Ser: 1.71 mg/dL — ABNORMAL HIGH (ref 0.44–1.00)
GFR, Estimated: 32 mL/min — ABNORMAL LOW (ref >60)
Glucose, Bld: 86 mg/dL (ref 70–99)
Potassium: 3.8 mmol/L (ref 3.5–5.1)
Sodium: 141 mmol/L (ref 135–145)

## 2020-03-29 ENCOUNTER — Encounter: Payer: Medicare HMO | Admitting: Internal Medicine

## 2020-04-04 ENCOUNTER — Ambulatory Visit: Payer: Medicare HMO | Admitting: Sports Medicine

## 2020-04-09 ENCOUNTER — Other Ambulatory Visit (HOSPITAL_COMMUNITY)
Admission: RE | Admit: 2020-04-09 | Discharge: 2020-04-09 | Disposition: A | Discharge status: Home / Self Care | Payer: Medicare HMO | Source: Ambulatory Visit | Attending: Internal Medicine | Admitting: Internal Medicine

## 2020-04-09 ENCOUNTER — Other Ambulatory Visit (HOSPITAL_COMMUNITY): Payer: Self-pay

## 2020-04-09 ENCOUNTER — Other Ambulatory Visit: Payer: Medicare HMO

## 2020-04-09 ENCOUNTER — Telehealth: Payer: Self-pay | Admitting: Internal Medicine

## 2020-04-09 ENCOUNTER — Other Ambulatory Visit: Payer: Self-pay

## 2020-04-09 DIAGNOSIS — Z95810 Presence of automatic (implantable) cardiac defibrillator: Secondary | ICD-10-CM

## 2020-04-09 DIAGNOSIS — I447 Left bundle-branch block, unspecified: Secondary | ICD-10-CM | POA: Diagnosis not present

## 2020-04-09 DIAGNOSIS — Z20822 Contact with and (suspected) exposure to covid-19: Secondary | ICD-10-CM | POA: Diagnosis not present

## 2020-04-09 DIAGNOSIS — I5022 Chronic systolic (congestive) heart failure: Secondary | ICD-10-CM

## 2020-04-09 DIAGNOSIS — Z01812 Encounter for preprocedural laboratory examination: Secondary | ICD-10-CM | POA: Diagnosis not present

## 2020-04-09 LAB — BASIC METABOLIC PANEL
BUN/Creatinine Ratio: 28 (ref 12–28)
BUN: 40 mg/dL — ABNORMAL HIGH (ref 8–27)
CO2: 25 mmol/L (ref 20–29)
Calcium: 9.5 mg/dL (ref 8.7–10.3)
Chloride: 105 mmol/L (ref 96–106)
Creatinine, Ser: 1.44 mg/dL — ABNORMAL HIGH (ref 0.57–1.00)
GFR calc Af Amer: 43 mL/min/{1.73_m2} — ABNORMAL LOW (ref >59)
GFR calc non Af Amer: 38 mL/min/{1.73_m2} — ABNORMAL LOW (ref >59)
Glucose: 105 mg/dL — ABNORMAL HIGH (ref 65–99)
Potassium: 4.9 mmol/L (ref 3.5–5.2)
Sodium: 142 mmol/L (ref 134–144)

## 2020-04-09 LAB — CBC WITH DIFFERENTIAL/PLATELET
Basophils Absolute: 0 10*3/uL (ref 0.0–0.2)
Basos: 0 %
EOS (ABSOLUTE): 0.5 10*3/uL — ABNORMAL HIGH (ref 0.0–0.4)
Eos: 7 %
Hematocrit: 32.2 % — ABNORMAL LOW (ref 34.0–46.6)
Hemoglobin: 11.1 g/dL (ref 11.1–15.9)
Lymphocytes Absolute: 1.3 10*3/uL (ref 0.7–3.1)
Lymphs: 19 %
MCH: 34.3 pg — ABNORMAL HIGH (ref 26.6–33.0)
MCHC: 34.5 g/dL (ref 31.5–35.7)
MCV: 99 fL — ABNORMAL HIGH (ref 79–97)
Monocytes Absolute: 0.7 10*3/uL (ref 0.1–0.9)
Monocytes: 10 %
Neutrophils Absolute: 4.3 10*3/uL (ref 1.4–7.0)
Neutrophils: 64 %
Platelets: 314 10*3/uL (ref 150–450)
RBC: 3.24 x10E6/uL — ABNORMAL LOW (ref 3.77–5.28)
RDW: 12 % (ref 11.7–15.4)
WBC: 6.9 10*3/uL (ref 3.4–10.8)

## 2020-04-09 LAB — SARS CORONAVIRUS 2 (TAT 6-24 HRS): SARS Coronavirus 2: NEGATIVE

## 2020-04-09 NOTE — Progress Notes (Signed)
Paramedicine Encounter    Patient ID: Rhonda [name], female    DOB: 15-May-1951, 69 y.o.   MRN: [id number]   Patient Care Team: Maurice Small, MD as PCP - General (Family Medicine) [name], LCSW as Social Worker (Licensed Clinical Social Worker) [name], [name], DO as Consulting Physician (Neurology)  Patient Active Problem List   Diagnosis Date Noted  . Diabetes mellitus ([place]) 06/15/2019  . Hypokalemia 11/08/2018  . CKD (chronic kidney disease) stage 3, GFR 30-59 ml/min (HCC) 11/08/2018  . Orthostatic hypotension 07/28/2017  . SVD (spontaneous vaginal delivery)   . Peripheral neuropathy   . On home oxygen therapy   . Migraines   . Left bundle branch block   . Hypothyroidism   . Hypertension   . Hyperlipidemia   . Heart murmur   . GERD (gastroesophageal reflux disease)   . Exertional shortness of breath   . Major depressive disorder   . Back pain   . Arthritis   . Generalized anxiety disorder   . Anemia   . Chronic respiratory failure ([place]) 09/14/2013  . 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 Morbid obesity ([place]) 11/01/2012  . Chronic systolic heart failure ([place]) 10/27/2012  . Endometrial polyp 01/20/2012  . Malignant tumor of breast ([place]) 03/26/2011  . Unspecified vitamin D deficiency 03/26/2011    Current Outpatient Medications:  .  acetaZOLAMIDE (DIAMOX) 125 MG tablet, TAKE 1 TABLET(125 MG) BY MOUTH DAILY, Disp: 90 tablet, Rfl: 3 .  carvedilol (COREG) 3.125 MG tablet, TAKE 1 TABLET(3.125 MG) BY MOUTH TWICE DAILY WITH A MEAL, Disp: 180 tablet, Rfl: 3 .  citalopram (CELEXA) 20 MG tablet, Take 20 mg by mouth daily., Disp: , Rfl:  .  dicyclomine (BENTYL) 20 MG tablet, Take 20 mg by mouth every 6 (six) hours., Disp: , Rfl:  .  DULoxetine (CYMBALTA) 30 MG capsule, Take 30 mg by mouth daily. Take once in the morning., Disp: , Rfl:  .  fexofenadine (ALLEGRA) 180 MG tablet, Take 180 mg by mouth 2 (two) times daily. , Disp: , Rfl:   .  fludrocortisone (FLORINEF) 0.1 MG tablet, TAKE 1 TABLET(0.1 MG) BY MOUTH TWICE DAILY, Disp: 60 tablet, Rfl: 3 .  gabapentin (NEURONTIN) 300 MG capsule, Take 300 mg by mouth 3 (three) times daily. , Disp: , Rfl:  .  gabapentin (NEURONTIN) 800 MG tablet, Take 1 tablet (800 mg total) by mouth at bedtime as needed (neuropathy)., Disp: 90 tablet, Rfl: 1 .  Ginkgo Biloba 120 MG TABS, Take by mouth., Disp: , Rfl:  .  hydrOXYzine (ATARAX/VISTARIL) 25 MG tablet, Take 25 mg by mouth 2 (two) times daily. Takes Morning and Bedtime, Disp: , Rfl:  .  hydrOXYzine (VISTARIL) 25 MG capsule, Take 25 mg by mouth 3 (three) times daily as needed. (Patient not taking: Reported on 03/26/2020), Disp: , Rfl:  .  ipratropium (ATROVENT) 0.03 % nasal spray, Place 2 sprays into both nostrils 2 (two) times daily., Disp: , Rfl:  .  levothyroxine (SYNTHROID) 88 MCG tablet, TAKE 1 TABLET(88 MCG) BY MOUTH DAILY BEFORE BREAKFAST, Disp: 90 tablet, Rfl: 1 .  metolazone (ZAROXOLYN) 2.5 MG tablet, Take 1 tablet by mouth every Tuesday, Disp: 12 tablet, Rfl: 3 .  midodrine (PROAMATINE) 10 MG tablet, Take 1 tablet (10 mg total) by mouth 3 (three) times daily with meals., Disp: 270 tablet, Rfl: 3 .  mirtazapine (REMERON) 30 MG tablet, TAKE 1 TABLET BY MOUTH EVERY NIGHT AT BEDTIME, Disp: 30 tablet, Rfl: 3 .  montelukast (SINGULAIR) 10 MG tablet, Take 10 mg by mouth at bedtime. , Disp: , Rfl: 1 .  omeprazole (PRILOSEC) 40 MG capsule, Take 40 mg by mouth daily., Disp: , Rfl:  .  potassium chloride SA (KLOR-CON) 20 MEQ tablet, Take 3 tablets (60 mEq total) by mouth 3 (three) times daily. Take an extra 50meq(2 tablets) on the days you take the metolazone., Disp: 300 tablet, Rfl: 3 .  probenecid (BENEMID) 500 MG tablet, Take 500 mg by mouth 2 (two) times daily., Disp: , Rfl:  .  simvastatin (ZOCOR) 10 MG tablet, Take 10 mg by mouth every evening. , Disp: , Rfl:  .  spironolactone (ALDACTONE) 25 MG tablet, Take 1 tablet (25 mg total) by mouth  every evening., Disp: 90 tablet, Rfl: 3 .  torsemide (DEMADEX) 20 MG tablet, Take 3 tablets (60 mg total) by mouth 2 (two) times daily., Disp: 540 tablet, Rfl: 3 .  traMADol (ULTRAM) 50 MG tablet, Take by mouth every 6 (six) hours as needed., Disp: , Rfl:  Allergies  Allergen Reactions  . Ceftin Anaphylaxis    Face and throat swell   . Geodon [Ziprasidone Hcl] Hives  . Lisinopril Other (See Comments)    angioedema  . Shellfish Allergy Other (See Comments)    Gout exacerbation  . Allopurinol Nausea Only and Other (See Comments)    weakness  . Ativan [Lorazepam] Itching  . Sulfa Antibiotics Itching  . Ultram [Tramadol Hcl] Itching  . Valium [Diazepam] Other (See Comments)    Patient states that diazepam doesn't relax, it has the opposite effect.     Social History   Socioeconomic History  . Marital status: Married    Spouse name: Not on file  . Number of children: 2  . Years of education: 63  . Highest education level: Master's degree (e.g., MA, MS, MEng, MEd, MSW, MBA)  Occupational History  . Occupation: retired  Tobacco Use  . Smoking status: Current Some Day Smoker    Packs/day: 0.10    Years: 26.00    Pack years: 2.60    Types: Cigarettes    Last attempt to quit: 05/06/2007    Years since quitting: 12.9  . Smokeless tobacco: Never Used  Vaping Use  . Vaping Use: Never used  Substance and Sexual Activity  . Alcohol use: No  . Drug use: No  . Sexual activity: Yes  Other Topics Concern  . 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 Difficulty of Paying Living Expenses: Not on file  Food  Insecurity:   . Worried About Charity fundraiser in the Last Year: Not on file  . Ran Out of Food in the Last Year: Not on file  Transportation Needs:   . Lack of Transportation (Medical): Not on file  . Lack of Transportation (Non-Medical): Not on file  Physical Activity:   . Days of Exercise per Week: Not on file  . Minutes of Exercise per Session: Not on file  Stress:   .  Feeling of Stress : Not on file  Social Connections:   . Frequency of Communication with Friends and Family: Not on file  . Frequency of Social Gatherings with Friends and Family: Not on file  . Attends Religious Services: Not on file  . Active Member of Clubs or Organizations: Not on file  . Attends Archivist Meetings: Not on file  . Marital Status: Not on file  Intimate Partner Violence:   . Fear of Current or Ex-Partner: Not on file  . Emotionally Abused: Not on file  . Physically Abused: Not on file  . Sexually Abused: Not on file    Physical Exam Vitals reviewed.  Constitutional:      Appearance: She is normal weight.  HENT:     Head: Normocephalic.     Nose: Nose normal.     Mouth/Throat:     Mouth: Mucous membranes are moist.     Pharynx: Oropharynx is clear.  Eyes:     Pupils: Pupils are equal, round, and reactive to light.  Cardiovascular:     Rate and Rhythm: Normal rate and regular rhythm.     Pulses: Normal pulses.     Heart sounds: Normal heart sounds.  Pulmonary:     Effort: Pulmonary effort is normal.     Breath sounds: Normal breath sounds.  Abdominal:     General: Abdomen is flat.     Palpations: Abdomen is soft.  Musculoskeletal:        General: Swelling present. Normal range of motion.     Cervical back: Normal range of motion.     Right lower leg: Edema present.     Left lower leg: Edema present.  Skin:    General: Skin is warm and dry.     Capillary Refill: Capillary refill takes less than 2 seconds.  Neurological:     General: No focal deficit present.      Mental Status: She is alert. Mental status is at baseline.  Psychiatric:        Mood and Affect: Mood normal.      Arrived for home visit for Saphira who was alert and oriented reporting she was asleep prior to my arrival because she had taken a pain pill for her back. Nicloe had her labs and covid screening today for her upcoming procedure on Thursday at Dr. Tanna Furry office. All were within normal range. Vitals were obtained. Patient's weight was up today from 2 weeks ago however she missed a few doses of her medications and reports drinking a lot of tea and water. I reviewed these issues with Mrs. Stansbury and she verbalized understanding. Meds were verified and pill box completed. Otelia agreed to visit in one week. Home visit complete.   Refills: NONE    Future Appointments  Date Time Provider [place]  04/11/2020 11:20 AM [facility] DEVICE REMOTES [facility]  04/25/2020 12:00 PM [facility] DEVICE 1 [facility]  05/09/2020  5:00 PM [facility] DEVICE REMOTES [facility]  05/13/2020  2:00 PM Desenglau, Tommy Rainwater, PT [facility]  05/20/2020 12:30 PM Desenglau, Tommy Rainwater, PT [facility]  06/12/2020 10:00 AM Philemon Kingdom, MD [facility] None  07/25/2020  2:00 PM Evans Lance, MD [facility]  08/08/2020  5:00 PM [facility] DEVICE REMOTES [facility]  11/07/2020  5:00 PM [facility] DEVICE REMOTES [facility]  02/06/2021  5:00 PM [facility] DEVICE REMOTES [facility]  02/27/2021  9:50 AM Narda Amber K, DO [facility] None  05/08/2021  5:00 PM  [facility] DEVICE REMOTES [facility]  06/09/2021  5:00 PM [facility] DEVICE REMOTES [facility]  07/09/2021  5:00 PM [facility] DEVICE REMOTES [facility]     ACTION: Home visit completed Next visit planned for one week

## 2020-04-09 NOTE — Telephone Encounter (Signed)
Patient has a heart cath scheduled for 11/18. She states that there is a cleaner she needs prior to the surgery to clean the area around her pacemaker. She wants to know what the name of it is and what the bottle looks like because she believes she misplaced it. Please advise.

## 2020-04-09 NOTE — Telephone Encounter (Signed)
Pt is unable to find her CHG scrub.  She will have her husband come by the office tomorrow to pick up an additional bottle.   Will leave up front for pick up

## 2020-04-10 ENCOUNTER — Telehealth: Payer: Self-pay | Admitting: Internal Medicine

## 2020-04-10 NOTE — Progress Notes (Signed)
Instructed patient on the following items: Arrival time 0730 Nothing to eat or drink after midnight No meds AM of procedure Responsible person to drive you home and stay with you for 24 hrs Wash with special soap night before and morning of procedure  

## 2020-04-10 NOTE — Telephone Encounter (Signed)
Sent staff message to EMT  Advised not to worry regarding calling anyone back.  Advised her to have Pt give her number to short stay nurse for them to call when she is being worked up prior to her procedure.

## 2020-04-10 NOTE — Telephone Encounter (Signed)
Rhonda [name], emt called on behalf of the patient to confirm her med list. [name] says the patient stated a nurse called her and was requesting to confirm her medication list. Did not see a telephone note with who called requesting the information. [name] states if someone calls her back at [phone] she can confirm the medications.   Thank you!

## 2020-04-11 ENCOUNTER — Ambulatory Visit (HOSPITAL_COMMUNITY)
Admission: RE | Admit: 2020-04-11 | Discharge: 2020-04-11 | Disposition: A | Discharge status: Home / Self Care | Payer: Medicare HMO | Attending: Internal Medicine | Admitting: Internal Medicine

## 2020-04-11 ENCOUNTER — Other Ambulatory Visit: Payer: Self-pay

## 2020-04-11 ENCOUNTER — Encounter (HOSPITAL_COMMUNITY)
Admission: RE | Disposition: A | Discharge status: Home / Self Care | Payer: Medicare HMO | Source: Home / Self Care | Attending: Internal Medicine

## 2020-04-11 DIAGNOSIS — Z8249 Family history of ischemic heart disease and other diseases of the circulatory system: Secondary | ICD-10-CM | POA: Insufficient documentation

## 2020-04-11 DIAGNOSIS — Z6839 Body mass index (BMI) 39.0-39.9, adult: Secondary | ICD-10-CM | POA: Diagnosis not present

## 2020-04-11 DIAGNOSIS — I130 Hypertensive heart and chronic kidney disease with heart failure and stage 1 through stage 4 chronic kidney disease, or unspecified chronic kidney disease: Secondary | ICD-10-CM | POA: Insufficient documentation

## 2020-04-11 DIAGNOSIS — N183 Chronic kidney disease, stage 3 unspecified: Secondary | ICD-10-CM | POA: Diagnosis not present

## 2020-04-11 DIAGNOSIS — I5022 Chronic systolic (congestive) heart failure: Secondary | ICD-10-CM | POA: Diagnosis not present

## 2020-04-11 DIAGNOSIS — Z79899 Other long term (current) drug therapy: Secondary | ICD-10-CM | POA: Insufficient documentation

## 2020-04-11 DIAGNOSIS — Z87891 Personal history of nicotine dependence: Secondary | ICD-10-CM | POA: Diagnosis not present

## 2020-04-11 DIAGNOSIS — Z882 Allergy status to sulfonamides status: Secondary | ICD-10-CM | POA: Insufficient documentation

## 2020-04-11 DIAGNOSIS — I872 Venous insufficiency (chronic) (peripheral): Secondary | ICD-10-CM | POA: Diagnosis not present

## 2020-04-11 DIAGNOSIS — Z4502 Encounter for adjustment and management of automatic implantable cardiac defibrillator: Secondary | ICD-10-CM | POA: Diagnosis not present

## 2020-04-11 DIAGNOSIS — Z888 Allergy status to other drugs, medicaments and biological substances status: Secondary | ICD-10-CM | POA: Insufficient documentation

## 2020-04-11 HISTORY — PX: BIV ICD GENERATOR CHANGEOUT: EP1194

## 2020-04-11 LAB — GLUCOSE, CAPILLARY
Glucose-Capillary: 128 mg/dL — ABNORMAL HIGH (ref 70–99)
Glucose-Capillary: 76 mg/dL (ref 70–99)

## 2020-04-11 SURGERY — BIV ICD GENERATOR CHANGEOUT

## 2020-04-11 MED ORDER — LIDOCAINE HCL (PF) 1 % IJ SOLN
INTRAMUSCULAR | Status: DC | PRN
  Administered 2020-04-11: 60 mL

## 2020-04-11 MED ORDER — MIDAZOLAM HCL 5 MG/5ML IJ SOLN
INTRAMUSCULAR | Status: AC
  Filled 2020-04-11: qty 5

## 2020-04-11 MED ORDER — VANCOMYCIN HCL IN DEXTROSE 1-5 GM/200ML-% IV SOLN
INTRAVENOUS | Status: AC
  Filled 2020-04-11: qty 200

## 2020-04-11 MED ORDER — VANCOMYCIN HCL IN DEXTROSE 1-5 GM/200ML-% IV SOLN
1000.0000 mg | INTRAVENOUS | Status: DC
  Administered 2020-04-11: 1000 mg via INTRAVENOUS

## 2020-04-11 MED ORDER — FENTANYL CITRATE (PF) 100 MCG/2ML IJ SOLN
INTRAMUSCULAR | Status: AC
  Filled 2020-04-11: qty 2

## 2020-04-11 MED ORDER — ONDANSETRON HCL 4 MG/2ML IJ SOLN: 4.0000 mg | Freq: Four times a day (QID) | INTRAMUSCULAR | Status: DC | PRN

## 2020-04-11 MED ORDER — FENTANYL CITRATE (PF) 100 MCG/2ML IJ SOLN
INTRAMUSCULAR | Status: DC | PRN
  Administered 2020-04-11: 25 ug via INTRAVENOUS

## 2020-04-11 MED ORDER — SODIUM CHLORIDE 0.9 % IV SOLN: 80.0000 mg | INTRAVENOUS | Status: DC

## 2020-04-11 MED ORDER — SODIUM CHLORIDE 0.9 % IV SOLN
INTRAVENOUS | Status: AC
  Filled 2020-04-11: qty 2

## 2020-04-11 MED ORDER — SODIUM CHLORIDE 0.9 % IV SOLN: INTRAVENOUS | Status: DC

## 2020-04-11 MED ORDER — CEFAZOLIN SODIUM-DEXTROSE 2-4 GM/100ML-% IV SOLN
INTRAVENOUS | Status: AC
  Filled 2020-04-11: qty 100

## 2020-04-11 MED ORDER — MIDAZOLAM HCL 5 MG/5ML IJ SOLN
INTRAMUSCULAR | Status: DC | PRN
  Administered 2020-04-11: 2 mg via INTRAVENOUS

## 2020-04-11 MED ORDER — HEPARIN (PORCINE) IN NACL 1000-0.9 UT/500ML-% IV SOLN
INTRAVENOUS | Status: AC
  Filled 2020-04-11: qty 500

## 2020-04-11 MED ORDER — CHLORHEXIDINE GLUCONATE 4 % EX LIQD: 4.0000 "application " | Freq: Once | CUTANEOUS | Status: DC

## 2020-04-11 MED ORDER — LIDOCAINE HCL 1 % IJ SOLN
INTRAMUSCULAR | Status: AC
  Filled 2020-04-11: qty 60

## 2020-04-11 MED ORDER — SODIUM CHLORIDE 0.9 % IV SOLN
INTRAVENOUS | Status: DC | PRN
  Administered 2020-04-11: 500 mL

## 2020-04-11 MED ORDER — ACETAMINOPHEN 325 MG PO TABS: 325.0000 mg | ORAL_TABLET | ORAL | Status: DC | PRN

## 2020-04-11 SURGICAL SUPPLY — 4 items
CABLE SURGICAL S-101-97-12 (CABLE) ×2 IMPLANT
ICD CLARIA MRI DTMA1D1 (ICD Generator) ×2 IMPLANT
PAD PRO RADIOLUCENT 2001M-C (PAD) ×2 IMPLANT
TRAY PACEMAKER INSERTION (PACKS) ×2 IMPLANT

## 2020-04-11 NOTE — Discharge Instructions (Signed)

## 2020-04-11 NOTE — Progress Notes (Signed)
Discharge instructions reviewed with pt and her husband (via telephone) both voice understanding.  

## 2020-04-11 NOTE — H&P (Signed)
HPI Rhonda [name] returns today for ongoing evaluation and management of chronic systolic heart failure status post ICD insertion.  She is a very pleasant 69 year old woman with a long history of congestive heart failure, status post biventricular ICD insertion.  She has class II heart failure symptoms.  She has morbid obesity and has been unable to lose weight.  Unfortunately she has gained over 30 pounds in the last year.  She is approaching elective replacement on her device.  She has had no ICD therapies.  She admits to some dietary indiscretion. Allergies  Allergen Reactions  . Ceftin Anaphylaxis    Face and throat swell   . Geodon [Ziprasidone Hcl] Hives  . Lisinopril Other (See Comments)    angioedema  . Shellfish Allergy Other (See Comments)    Gout exacerbation  . Allopurinol Nausea Only and Other (See Comments)    weakness  . Ativan [Lorazepam] Itching  . Sulfa Antibiotics Itching  . Ultram [Tramadol Hcl] Itching  . Valium [Diazepam] Other (See Comments)    Patient states that diazepam doesn't relax, it has the opposite effect.           Current Outpatient Medications  Medication Sig Dispense Refill  . acetaZOLAMIDE (DIAMOX) 125 MG tablet TAKE 1 TABLET(125 MG) BY MOUTH DAILY 90 tablet 3  . carboxymethylcellulose (REFRESH PLUS) 0.5 % SOLN Place 1 drop into both eyes 3 (three) times daily as needed (dry eyes).    . carvedilol (COREG) 3.125 MG tablet TAKE 1 TABLET(3.125 MG) BY MOUTH TWICE DAILY WITH A MEAL 180 tablet 3  . citalopram (CELEXA) 20 MG tablet Take 20 mg by mouth daily.    . clonazePAM (KLONOPIN) 0.5 MG tablet Take 0.5 mg by mouth 3 (three) times daily as needed for anxiety.     . DULoxetine (CYMBALTA) 30 MG capsule Take 30 mg by mouth daily. Take once in the morning.    . fexofenadine (ALLEGRA) 180 MG tablet Take 180 mg by mouth 2 (two) times daily.     . fludrocortisone (FLORINEF) 0.1 MG tablet TAKE 1 TABLET(0.1 MG) BY MOUTH TWICE DAILY 60  tablet 3  . gabapentin (NEURONTIN) 300 MG capsule Take 300 mg by mouth 3 (three) times daily.     Marland Kitchen gabapentin (NEURONTIN) 800 MG tablet Take 1 tablet (800 mg total) by mouth at bedtime as needed (neuropathy). 90 tablet 1  . HYDROcodone-acetaminophen (NORCO) 10-325 MG tablet Take 1 tablet by mouth every 6 (six) hours as needed.    Marland Kitchen ipratropium (ATROVENT) 0.03 % nasal spray Place 2 sprays into both nostrils 2 (two) times daily.    Marland Kitchen levothyroxine (SYNTHROID) 88 MCG tablet TAKE 1 TABLET(88 MCG) BY MOUTH DAILY BEFORE BREAKFAST 90 tablet 1  . metolazone (ZAROXOLYN) 2.5 MG tablet Take 1 tablet by mouth every Tuesday 12 tablet 3  . midodrine (PROAMATINE) 10 MG tablet Take 1 tablet (10 mg total) by mouth 3 (three) times daily with meals. 270 tablet 3  . mirtazapine (REMERON) 30 MG tablet TAKE 1 TABLET BY MOUTH EVERY NIGHT AT BEDTIME 30 tablet 3  . montelukast (SINGULAIR) 10 MG tablet Take 10 mg by mouth at bedtime.   1  . ondansetron (ZOFRAN) 8 MG tablet     . potassium chloride SA (KLOR-CON) 20 MEQ tablet Take 2 tablets (40 mEq total) by mouth 3 (three) times daily. Take an extra 91meq(2 tablets) on the days you take the metolazone. 300 tablet 3  . probenecid (BENEMID) 500 MG tablet Take 500  mg by mouth 2 (two) times daily.    . simvastatin (ZOCOR) 10 MG tablet Take 10 mg by mouth every evening.     Marland Kitchen spironolactone (ALDACTONE) 25 MG tablet Take 1 tablet (25 mg total) by mouth every evening. 90 tablet 3  . torsemide (DEMADEX) 20 MG tablet Take 3 tablets (60 mg total) by mouth 2 (two) times daily. 540 tablet 3   No current facility-administered medications for this visit.         Past Medical History:  Diagnosis Date  . Anemia   . Arthritis    Right knee  . Asthma   . Back pain    Disk problem  . Breast cancer, stage 1 ([place]) 03/26/2011   Left; completed chemotherapy and radiation treatments  . Cardiomyopathy   . Chronic respiratory failure ([place]) 09/14/2013  .  Chronic systolic heart failure (HCC)    a) NICM b) ECHO (03/2013) EF 20-25% c) ECHO (09/2013) EF 45-50%, grade I DD  . CKD (chronic kidney disease) stage 3, GFR 30-59 ml/min 11/08/2018  . Complication of anesthesia    History of low blood pressure after surgery; attributed to lying flat  . Diabetes mellitus    "diet controlled" (05/03/2013)  . Exertional shortness of breath   . Generalized anxiety disorder   . GERD (gastroesophageal reflux disease)   . Gout   . Heart murmur   . Hyperlipidemia   . Hypertension   . Hypokalemia 11/08/2018  . Hypothyroidism   . Left bundle branch block    s/p CRT-D (04/2013)  . Major depressive disorder   . Migraines   . On home oxygen therapy    "2L suppose to be q night" (05/03/2013)  . Orthostatic hypotension 07/28/2017  . Peripheral neuropathy    Feet  . SVD (spontaneous vaginal delivery)    x 2  . Unspecified vitamin D deficiency 03/26/2011   Does not take meds    ROS:   All systems reviewed and negative except as noted in the HPI.        Past Surgical History:  Procedure Laterality Date  . BI-VENTRICULAR IMPLANTABLE CARDIOVERTER DEFIBRILLATOR N/A 05/03/2013   Procedure: BI-VENTRICULAR IMPLANTABLE CARDIOVERTER DEFIBRILLATOR  (CRT-D);  Surgeon: Evans Lance, MD;  Location: [facility];  Service: Cardiovascular;  Laterality: N/A;  . BI-VENTRICULAR IMPLANTABLE CARDIOVERTER DEFIBRILLATOR  (CRT-D)  05/03/2013   MDT CRTD implanted by Dr Lovena Le for non ischemic cardiomyopathy  . BIOPSY  05/31/2018   Procedure: BIOPSY;  Surgeon: Ronnette Juniper, MD;  Location: [facility];  Service: Gastroenterology;;  . BREAST LUMPECTOMY Left 07/28/2010  . CARDIAC CATHETERIZATION  2010   NORMAL CORONARY ARTERIES  . 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--11 SURGERIES     PT HAS REMOVABLE SPEECH BULB-TAKES IT OUT BEFORE HER SURGERY  . COLONOSCOPY    . COLONOSCOPY N/A 05/31/2018   Procedure: COLONOSCOPY;  Surgeon: Ronnette Juniper, MD;   Location: [facility];  Service: Gastroenterology;  Laterality: N/A;  . HYSTEROSCOPY WITH D & C  01/20/2012   Procedure: DILATATION AND CURETTAGE /HYSTEROSCOPY;  Surgeon: Margarette Asal, MD;  Location: [facility];  Service: Gynecology;  Laterality: N/A;  with trueclear  . PORT-A-CATH REMOVAL  09/23/2011   Procedure: REMOVAL PORT-A-CATH;  Surgeon: Rolm Bookbinder, MD;  Location: [facility];  Service: General;  Laterality: N/A;  Port Removal  . PORTACATH PLACEMENT  2012  . TONSILLECTOMY  1960's  . TOTAL KNEE ARTHROPLASTY  05/10/2012   Procedure: TOTAL KNEE ARTHROPLASTY;  Surgeon: Pietro Cassis  Alvan Dame, MD;  Location: [facility];  Service: Orthopedics;  Laterality: Right;  . WISDOM TOOTH EXTRACTION       Family History  Problem Relation Age of Onset  . Alzheimer's disease Mother   . CVA Mother   . Hypertension Mother   . Cancer - Colon Father        late 53  . Birth defects Paternal Uncle      Social History        Socioeconomic History  . Marital status: Married    Spouse name: Not on file  . Number of children: 2  . Years of education: 73  . Highest education level: Master's degree (e.g., MA, MS, MEng, MEd, MSW, MBA)  Occupational History  . Occupation: retired  Tobacco Use  . Smoking status: Former Smoker    Packs/day: 0.10    Years: 26.00    Pack years: 2.60    Types: Cigarettes    Quit date: 05/06/2007    Years since quitting: 12.7  . Smokeless tobacco: Never Used  Vaping Use  . Vaping Use: Never used  Substance and Sexual Activity  . Alcohol use: No  . Drug use: No  . Sexual activity: Yes  Other Topics Concern  . 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 Difficulty of Paying Living Expenses: Not on file  Food Insecurity:   . Worried About Charity fundraiser in the Last Year: Not on file  . Ran Out of Food in the Last Year: Not on file  Transportation Needs:   . Lack of Transportation (Medical): Not on file  . Lack of Transportation (Non-Medical): Not on file  Physical Activity:   . Days of Exercise per Week: Not on file  . Minutes of Exercise per Session: Not on file  Stress:   . Feeling of Stress : Not on file  Social Connections:   . Frequency of Communication with Friends and Family: Not on file  . Frequency of Social Gatherings with Friends and Family: Not on file  . Attends Religious Services: Not on file  . Active Member of Clubs or Organizations: Not on file  . Attends Archivist Meetings: Not on file  . Marital Status: Not on file  Intimate Partner Violence:   . Fear of Current or Ex-Partner: Not on file  . Emotionally Abused: Not on file  . Physically Abused: Not on file  . Sexually Abused: Not on file     BP 122/68   Pulse 77   Ht 5\' 1"  (1.549 m)   Wt 210 lb 6.4 oz (95.4 kg)   BMI 39.75 kg/m   Physical Exam:  Well appearing NAD HEENT: Unremarkable Neck:  No JVD, no thyromegally Lymphatics:  No adenopathy Back:  No CVA tenderness Lungs:  Clear HEART:  Regular rate rhythm, no murmurs, no rubs, no clicks Abd:  soft, positive bowel sounds, no organomegally, no rebound, no guarding Ext:  2 plus pulses, no edema, no cyanosis,  no clubbing Skin:  No rashes no nodules Neuro:  CN II through XII intact, motor grossly intact  EKG -normal sinus rhythm with biventricular pacing  DEVICE  Normal device function.  See PaceArt for details.  Approaching ERI  Assess/Plan: 1.  Chronic systolic heart failure -she will continue her current medical therapy and maintain a low-sodium diet. 2.  ICD  -Medtronic BiV ICD is working normally.  We will plan to recheck after her generator change out. 3.  Obesity -we discussed the importance of weight loss.  We discussed intermittent fasting. 4.  Venous insufficiency -her symptoms are somewhat improved.  She is encouraged to maintain a low-sodium diet and to use support stockings.  Cristopher Peru, MD  Ep Attending  Patient seen and examined. Agree with the findings as noted above. The patient presents for ICD gen change out. I have reviewed the indications risks/benefits/goals of ICD gen change out and she wishes to proceed.  Carleene Overlie Peace Jost,MD

## 2020-04-11 NOTE — Progress Notes (Signed)
Dr Lovena Le states no need for tele

## 2020-04-12 ENCOUNTER — Encounter (HOSPITAL_COMMUNITY): Payer: Self-pay | Admitting: Internal Medicine

## 2020-04-12 MED FILL — Lidocaine HCl Local Inj 1%: INTRAMUSCULAR | Qty: 60 | Status: AC

## 2020-04-12 MED FILL — Heparin Sod (Porcine)-NaCl IV Soln 1000 Unit/500ML-0.9%: INTRAVENOUS | Qty: 500 | Status: AC

## 2020-04-12 NOTE — Progress Notes (Signed)
ICM remote transmission rescheduled for 05/27/2020 due to allow baseline fluid levels to develop after 11/18/2021device generator change out.

## 2020-04-16 ENCOUNTER — Telehealth (HOSPITAL_COMMUNITY): Payer: Self-pay | Admitting: Cardiology

## 2020-04-16 ENCOUNTER — Other Ambulatory Visit (HOSPITAL_COMMUNITY): Payer: Self-pay

## 2020-04-16 NOTE — Progress Notes (Signed)
Paramedicine Encounter    Patient ID: Rhonda [name], female    DOB: 09/15/1950, 69 y.o.   MRN: [id number]   Patient Care Team: Maurice Small, MD as PCP - General (Family Medicine) [name], LCSW as Social Worker (Licensed Clinical Social Worker) [name], [name], DO as Consulting Physician (Neurology)  Patient Active Problem List   Diagnosis Date Noted  . Diabetes mellitus ([place]) 06/15/2019  . Hypokalemia 11/08/2018  . CKD (chronic kidney disease) stage 3, GFR 30-59 ml/min (HCC) 11/08/2018  . Orthostatic hypotension 07/28/2017  . SVD (spontaneous vaginal delivery)   . Peripheral neuropathy   . On home oxygen therapy   . Migraines   . Left bundle branch block   . Hypothyroidism   . Hypertension   . Hyperlipidemia   . Heart murmur   . GERD (gastroesophageal reflux disease)   . Exertional shortness of breath   . Major depressive disorder   . Back pain   . Arthritis   . Generalized anxiety disorder   . Anemia   . Chronic respiratory failure ([place]) 09/14/2013  . 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 Morbid obesity ([place]) 11/01/2012  . Chronic systolic heart failure ([place]) 10/27/2012  . Endometrial polyp 01/20/2012  . Malignant tumor of breast ([place]) 03/26/2011  . Unspecified vitamin D deficiency 03/26/2011    Current Outpatient Medications:  .  acetaZOLAMIDE (DIAMOX) 125 MG tablet, TAKE 1 TABLET(125 MG) BY MOUTH DAILY, Disp: 90 tablet, Rfl: 3 .  carvedilol (COREG) 3.125 MG tablet, TAKE 1 TABLET(3.125 MG) BY MOUTH TWICE DAILY WITH A MEAL, Disp: 180 tablet, Rfl: 3 .  citalopram (CELEXA) 20 MG tablet, Take 20 mg by mouth daily., Disp: , Rfl:  .  dicyclomine (BENTYL) 20 MG tablet, Take 20 mg by mouth every 6 (six) hours., Disp: , Rfl:  .  DULoxetine (CYMBALTA) 30 MG capsule, Take 30 mg by mouth daily. Take once in the morning., Disp: , Rfl:  .  fexofenadine (ALLEGRA) 180 MG tablet, Take 180 mg by mouth 2 (two) times daily. , Disp: , Rfl:   .  fludrocortisone (FLORINEF) 0.1 MG tablet, TAKE 1 TABLET(0.1 MG) BY MOUTH TWICE DAILY, Disp: 60 tablet, Rfl: 3 .  gabapentin (NEURONTIN) 300 MG capsule, Take 300 mg by mouth 3 (three) times daily. , Disp: , Rfl:  .  gabapentin (NEURONTIN) 800 MG tablet, Take 1 tablet (800 mg total) by mouth at bedtime as needed (neuropathy)., Disp: 90 tablet, Rfl: 1 .  Ginkgo Biloba 120 MG TABS, Take by mouth., Disp: , Rfl:  .  hydrOXYzine (ATARAX/VISTARIL) 25 MG tablet, Take 25 mg by mouth 2 (two) times daily. Takes Morning and Bedtime, Disp: , Rfl:  .  hydrOXYzine (VISTARIL) 25 MG capsule, Take 25 mg by mouth 3 (three) times daily as needed. (Patient not taking: Reported on 03/26/2020), Disp: , Rfl:  .  ipratropium (ATROVENT) 0.03 % nasal spray, Place 2 sprays into both nostrils 2 (two) times daily., Disp: , Rfl:  .  levothyroxine (SYNTHROID) 88 MCG tablet, TAKE 1 TABLET(88 MCG) BY MOUTH DAILY BEFORE BREAKFAST, Disp: 90 tablet, Rfl: 1 .  metolazone (ZAROXOLYN) 2.5 MG tablet, Take 1 tablet by mouth every Tuesday, Disp: 12 tablet, Rfl: 3 .  midodrine (PROAMATINE) 10 MG tablet, Take 1 tablet (10 mg total) by mouth 3 (three) times daily with meals., Disp: 270 tablet, Rfl: 3 .  mirtazapine (REMERON) 30 MG tablet, TAKE 1 TABLET BY MOUTH EVERY NIGHT AT BEDTIME, Disp: 30 tablet, Rfl: 3 .  montelukast (SINGULAIR) 10 MG tablet, Take 10 mg by mouth at bedtime. , Disp: , Rfl: 1 .  omeprazole (PRILOSEC) 40 MG capsule, Take 40 mg by mouth daily., Disp: , Rfl:  .  potassium chloride SA (KLOR-CON) 20 MEQ tablet, Take 3 tablets (60 mEq total) by mouth 3 (three) times daily. Take an extra 71meq(2 tablets) on the days you take the metolazone., Disp: 300 tablet, Rfl: 3 .  probenecid (BENEMID) 500 MG tablet, Take 500 mg by mouth 2 (two) times daily., Disp: , Rfl:  .  simvastatin (ZOCOR) 10 MG tablet, Take 10 mg by mouth every evening. , Disp: , Rfl:  .  spironolactone (ALDACTONE) 25 MG tablet, Take 1 tablet (25 mg total) by mouth  every evening., Disp: 90 tablet, Rfl: 3 .  torsemide (DEMADEX) 20 MG tablet, Take 3 tablets (60 mg total) by mouth 2 (two) times daily., Disp: 540 tablet, Rfl: 3 .  traMADol (ULTRAM) 50 MG tablet, Take by mouth every 6 (six) hours as needed., Disp: , Rfl:  Allergies  Allergen Reactions  . Ceftin Anaphylaxis    Face and throat swell   . Geodon [Ziprasidone Hcl] Hives  . Lisinopril Other (See Comments)    angioedema  . Shellfish Allergy Other (See Comments)    Gout exacerbation  . Allopurinol Nausea Only and Other (See Comments)    weakness  . Ativan [Lorazepam] Itching  . Sulfa Antibiotics Itching  . Ultram [Tramadol Hcl] Itching  . Valium [Diazepam] Other (See Comments)    Patient states that diazepam doesn't relax, it has the opposite effect.     Social History   Socioeconomic History  . Marital status: Married    Spouse name: Not on file  . Number of children: 2  . Years of education: 25  . Highest education level: Master's degree (e.g., MA, MS, MEng, MEd, MSW, MBA)  Occupational History  . Occupation: retired  Tobacco Use  . Smoking status: Current Some Day Smoker    Packs/day: 0.10    Years: 26.00    Pack years: 2.60    Types: Cigarettes    Last attempt to quit: 05/06/2007    Years since quitting: 12.9  . Smokeless tobacco: Never Used  Vaping Use  . Vaping Use: Never used  Substance and Sexual Activity  . Alcohol use: No  . Drug use: No  . Sexual activity: Yes  Other Topics Concern  . 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 Difficulty of Paying Living Expenses: Not on file  Food  Insecurity:   . Worried About Charity fundraiser in the Last Year: Not on file  . Ran Out of Food in the Last Year: Not on file  Transportation Needs:   . Lack of Transportation (Medical): Not on file  . Lack of Transportation (Non-Medical): Not on file  Physical Activity:   . Days of Exercise per Week: Not on file  . Minutes of Exercise per Session: Not on file  Stress:   .  Feeling of Stress : Not on file  Social Connections:   . Frequency of Communication with Friends and Family: Not on file  . Frequency of Social Gatherings with Friends and Family: Not on file  . Attends Religious Services: Not on file  . Active Member of Clubs or Organizations: Not on file  . Attends Archivist Meetings: Not on file  . Marital Status: Not on file  Intimate Partner Violence:   . Fear of Current or Ex-Partner: Not on file  . Emotionally Abused: Not on file  . Physically Abused: Not on file  . Sexually Abused: Not on file    Physical Exam Vitals reviewed.  Constitutional:      Appearance: Normal appearance.  HENT:     Head: Normocephalic.     Nose: Nose normal.     Mouth/Throat:     Mouth: Mucous membranes are moist.     Pharynx: Oropharynx is clear.  Eyes:     Pupils: Pupils are equal, round, and reactive to light.  Cardiovascular:     Rate and Rhythm: Normal rate and regular rhythm.     Pulses: Normal pulses.     Heart sounds: Normal heart sounds.  Pulmonary:     Effort: Pulmonary effort is normal.     Breath sounds: Normal breath sounds.  Abdominal:     General: Abdomen is flat.     Palpations: Abdomen is soft.  Musculoskeletal:        General: Swelling present. Normal range of motion.     Right lower leg: Edema present.     Left lower leg: Edema present.  Skin:    General: Skin is warm and dry.     Capillary Refill: Capillary refill takes less than 2 seconds.  Neurological:     General: No focal deficit present.     Mental Status: She is alert. Mental status is  at baseline.  Psychiatric:        Mood and Affect: Mood normal.     Arrived for home visit for Rhonda [name] who was alert and oriented reporting she has gained weight over the last week. Rhonda [name] reports drinking 4 sweet teas a day and eating pasta and bread. I educated Rhonda [name] on diet plan. I called HF clinic to report same with leg edema and swelling and weight gain with little to no urination. Ellen Henri PA increased Torsemide to 80mg  tonight and BID tomorrow adding 20MEQ of Potassium tomorrow. Vitals as noted. I reviewed medications and filled box accordingly with changes. Rhonda [name] agreed to be more diligent about fluid and PO intake as well as exercise when she can. Rhonda [name] was instructed to weigh daily and to take medications as prescribed. Rhonda [name] agreed. Home visit complete. I will follow up in one week.   Refills: NONE    Future Appointments  Date Time Provider [place]  04/25/2020 12:00 PM [facility] DEVICE 1 [facility]  05/13/2020  2:00 PM Desenglau, Tommy Rainwater, PT [facility]  05/20/2020 12:30 PM Desenglau, Tommy Rainwater, PT [facility]  05/27/2020  7:55 AM [facility] DEVICE REMOTES [facility]  06/12/2020 10:00 AM Philemon Kingdom, MD [facility] None  07/11/2020  7:40 AM [facility] DEVICE REMOTES [facility]  07/25/2020  2:00 PM Evans Lance, MD [facility]  10/10/2020  7:40 AM [facility] DEVICE REMOTES [facility]  01/09/2021  7:40 AM [facility] DEVICE REMOTES [facility]  02/27/2021  9:50 AM Narda Amber K, DO [facility] None  04/10/2021  7:40  AM [facility] DEVICE REMOTES [facility]  07/10/2021  7:40 AM [facility] DEVICE REMOTES [facility]  08/11/2021  7:40 AM [facility] DEVICE REMOTES [facility]  09/10/2021  7:40 AM [facility] DEVICE REMOTES [facility]     ACTION: Home visit completed Next visit planned for one  week

## 2020-04-16 NOTE — Telephone Encounter (Signed)
Rhonda [name] with para medicine called during patients home visit to report 10 lb weight gain. Increased edema otherwise stable, vitals stable, patient has not been compliant with diet. Patient has been compliant with medications  Above reviewed with Ann Maki Per VO increase torsemide to 80 mg tonight 11/23, 80 BID 11/24 and return to routine dose 11/25. Take an additional 20 meq  Of potassium 11/24.  Pt aware via [name]

## 2020-04-23 ENCOUNTER — Other Ambulatory Visit: Payer: PRIVATE HEALTH INSURANCE

## 2020-04-23 ENCOUNTER — Other Ambulatory Visit (HOSPITAL_COMMUNITY): Payer: PRIVATE HEALTH INSURANCE

## 2020-04-23 ENCOUNTER — Other Ambulatory Visit (HOSPITAL_COMMUNITY): Payer: Self-pay

## 2020-04-23 NOTE — Progress Notes (Signed)
Paramedicine Encounter    Patient ID: Rhonda [name], female    DOB: 1951-03-02, 69 y.o.   MRN: [id number]   Patient Care Team: Maurice Small, MD as PCP - General (Family Medicine) [name], LCSW as Social Worker (Licensed Clinical Social Worker) [name], [name], DO as Consulting Physician (Neurology)  Patient Active Problem List   Diagnosis Date Noted  . Diabetes mellitus ([place]) 06/15/2019  . Hypokalemia 11/08/2018  . CKD (chronic kidney disease) stage 3, GFR 30-59 ml/min (HCC) 11/08/2018  . Orthostatic hypotension 07/28/2017  . SVD (spontaneous vaginal delivery)   . Peripheral neuropathy   . On home oxygen therapy   . Migraines   . Left bundle branch block   . Hypothyroidism   . Hypertension   . Hyperlipidemia   . Heart murmur   . GERD (gastroesophageal reflux disease)   . Exertional shortness of breath   . Major depressive disorder   . Back pain   . Arthritis   . Generalized anxiety disorder   . Anemia   . Chronic respiratory failure ([place]) 09/14/2013  . 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 Morbid obesity ([place]) 11/01/2012  . Chronic systolic heart failure ([place]) 10/27/2012  . Endometrial polyp 01/20/2012  . Malignant tumor of breast ([place]) 03/26/2011  . Unspecified vitamin D deficiency 03/26/2011    Current Outpatient Medications:  .  acetaZOLAMIDE (DIAMOX) 125 MG tablet, TAKE 1 TABLET(125 MG) BY MOUTH DAILY, Disp: 90 tablet, Rfl: 3 .  carvedilol (COREG) 3.125 MG tablet, TAKE 1 TABLET(3.125 MG) BY MOUTH TWICE DAILY WITH A MEAL, Disp: 180 tablet, Rfl: 3 .  citalopram (CELEXA) 20 MG tablet, Take 20 mg by mouth daily., Disp: , Rfl:  .  dicyclomine (BENTYL) 20 MG tablet, Take 20 mg by mouth every 6 (six) hours., Disp: , Rfl:  .  DULoxetine (CYMBALTA) 30 MG capsule, Take 30 mg by mouth daily. Take once in the morning., Disp: , Rfl:  .  fexofenadine (ALLEGRA) 180 MG tablet, Take 180 mg by mouth 2 (two) times daily. , Disp: , Rfl:   .  fludrocortisone (FLORINEF) 0.1 MG tablet, TAKE 1 TABLET(0.1 MG) BY MOUTH TWICE DAILY, Disp: 60 tablet, Rfl: 3 .  gabapentin (NEURONTIN) 300 MG capsule, Take 300 mg by mouth 3 (three) times daily. , Disp: , Rfl:  .  gabapentin (NEURONTIN) 800 MG tablet, Take 1 tablet (800 mg total) by mouth at bedtime as needed (neuropathy)., Disp: 90 tablet, Rfl: 1 .  Ginkgo Biloba 120 MG TABS, Take by mouth., Disp: , Rfl:  .  hydrOXYzine (ATARAX/VISTARIL) 25 MG tablet, Take 25 mg by mouth 2 (two) times daily. Takes Morning and Bedtime, Disp: , Rfl:  .  hydrOXYzine (VISTARIL) 25 MG capsule, Take 25 mg by mouth 3 (three) times daily as needed. (Patient not taking: Reported on 03/26/2020), Disp: , Rfl:  .  ipratropium (ATROVENT) 0.03 % nasal spray, Place 2 sprays into both nostrils 2 (two) times daily., Disp: , Rfl:  .  levothyroxine (SYNTHROID) 88 MCG tablet, TAKE 1 TABLET(88 MCG) BY MOUTH DAILY BEFORE BREAKFAST, Disp: 90 tablet, Rfl: 1 .  metolazone (ZAROXOLYN) 2.5 MG tablet, Take 1 tablet by mouth every Tuesday, Disp: 12 tablet, Rfl: 3 .  midodrine (PROAMATINE) 10 MG tablet, Take 1 tablet (10 mg total) by mouth 3 (three) times daily with meals., Disp: 270 tablet, Rfl: 3 .  mirtazapine (REMERON) 30 MG tablet, TAKE 1 TABLET BY MOUTH EVERY NIGHT AT BEDTIME, Disp: 30 tablet, Rfl: 3 .  montelukast (SINGULAIR) 10 MG tablet, Take 10 mg by mouth at bedtime. , Disp: , Rfl: 1 .  omeprazole (PRILOSEC) 40 MG capsule, Take 40 mg by mouth daily., Disp: , Rfl:  .  potassium chloride SA (KLOR-CON) 20 MEQ tablet, Take 3 tablets (60 mEq total) by mouth 3 (three) times daily. Take an extra 20meq(2 tablets) on the days you take the metolazone., Disp: 300 tablet, Rfl: 3 .  probenecid (BENEMID) 500 MG tablet, Take 500 mg by mouth 2 (two) times daily., Disp: , Rfl:  .  simvastatin (ZOCOR) 10 MG tablet, Take 10 mg by mouth every evening. , Disp: , Rfl:  .  spironolactone (ALDACTONE) 25 MG tablet, Take 1 tablet (25 mg total) by mouth  every evening., Disp: 90 tablet, Rfl: 3 .  torsemide (DEMADEX) 20 MG tablet, Take 3 tablets (60 mg total) by mouth 2 (two) times daily., Disp: 540 tablet, Rfl: 3 .  traMADol (ULTRAM) 50 MG tablet, Take by mouth every 6 (six) hours as needed., Disp: , Rfl:  Allergies  Allergen Reactions  . Ceftin Anaphylaxis    Face and throat swell   . Geodon [Ziprasidone Hcl] Hives  . Lisinopril Other (See Comments)    angioedema  . Shellfish Allergy Other (See Comments)    Gout exacerbation  . Allopurinol Nausea Only and Other (See Comments)    weakness  . Ativan [Lorazepam] Itching  . Sulfa Antibiotics Itching  . Ultram [Tramadol Hcl] Itching  . Valium [Diazepam] Other (See Comments)    Patient states that diazepam doesn't relax, it has the opposite effect.     Social History   Socioeconomic History  . Marital status: Married    Spouse name: Not on file  . Number of children: 2  . Years of education: 35  . Highest education level: Master's degree (e.g., MA, MS, MEng, MEd, MSW, MBA)  Occupational History  . Occupation: retired  Tobacco Use  . Smoking status: Current Some Day Smoker    Packs/day: 0.10    Years: 26.00    Pack years: 2.60    Types: Cigarettes    Last attempt to quit: 05/06/2007    Years since quitting: 12.9  . Smokeless tobacco: Never Used  Vaping Use  . Vaping Use: Never used  Substance and Sexual Activity  . Alcohol use: No  . Drug use: No  . Sexual activity: Yes  Other Topics Concern  . 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 Difficulty of Paying Living Expenses: Not on file  Food  Insecurity:   . Worried About Charity fundraiser in the Last Year: Not on file  . Ran Out of Food in the Last Year: Not on file  Transportation Needs:   . Lack of Transportation (Medical): Not on file  . Lack of Transportation (Non-Medical): Not on file  Physical Activity:   . Days of Exercise per Week: Not on file  . Minutes of Exercise per Session: Not on file  Stress:   .  Feeling of Stress : Not on file  Social Connections:   . Frequency of Communication with Friends and Family: Not on file  . Frequency of Social Gatherings with Friends and Family: Not on file  . Attends Religious Services: Not on file  . Active Member of Clubs or Organizations: Not on file  . Attends Archivist Meetings: Not on file  . Marital Status: Not on file  Intimate Partner Violence:   . Fear of Current or Ex-Partner: Not on file  . Emotionally Abused: Not on file  . Physically Abused: Not on file  . Sexually Abused: Not on file    Physical Exam Constitutional:      Appearance: She is normal weight.  HENT:     Head: Normocephalic.     Nose: Nose normal.     Mouth/Throat:     Mouth: Mucous membranes are moist.  Eyes:     Pupils: Pupils are equal, round, and reactive to light.  Cardiovascular:     Rate and Rhythm: Normal rate and regular rhythm.     Pulses: Normal pulses.     Heart sounds: Normal heart sounds.  Pulmonary:     Effort: Pulmonary effort is normal.     Breath sounds: Normal breath sounds.  Abdominal:     General: Abdomen is flat.     Palpations: Abdomen is soft.  Musculoskeletal:        General: Swelling and tenderness present. Normal range of motion.     Right lower leg: Edema present.     Left lower leg: Edema present.  Skin:    General: Skin is warm and dry.     Capillary Refill: Capillary refill takes less than 2 seconds.  Neurological:     General: No focal deficit present.     Mental Status: She is alert. Mental status is at baseline.  Psychiatric:         Mood and Affect: Mood normal.      Arrived for home visit for Jaylie who was alert and oriented reporting she was having some bilateral lower leg swelling over the last week more than her normal. I assessed same and they were swollen, red and warm to the touch. I advised the patient to make sure she is keeping them elevated and to reach out to her PCP about same due to past history of cellulitis. [name] agreed. Vitals were obtained and are as noted. Mara goes to see Dr. Lovena Le Thursday to have her wound from her ICD assessed. Site bandaged and Jeylin denied any complaints of pain or signs of infection. Elexia missed one evening dose of medications over the last week. Medications were verified and confirmed. Pill box filled accordingly. Subrena was reminded of upcoming appointments in which she agreed with same. Home visit complete. I will see Royetta in one week. If any symptoms worsen or change she is aware to call me.   Refills: Hydroxyzine    Future Appointments  Date Time Provider Early  04/25/2020 12:00 PM [facility] DEVICE 1 [facility]  05/13/2020  2:00 PM Desenglau, Tommy Rainwater, PT [facility]  05/20/2020 12:30 PM Desenglau, Tommy Rainwater, PT [facility]  05/27/2020  7:55 AM [facility] DEVICE REMOTES [facility]  06/12/2020 10:00 AM Philemon Kingdom, MD [facility] None  07/11/2020  7:40 AM [facility] DEVICE REMOTES [facility]  07/25/2020  2:00 PM Evans Lance, MD [facility]  10/10/2020  7:40 AM [facility] DEVICE REMOTES [facility]  01/09/2021  7:40 AM  [facility] REMOTES [facility]  02/27/2021  9:50 AM Narda Amber K, DO [facility] None  04/10/2021  7:40 AM [facility] DEVICE REMOTES [facility]  07/10/2021  7:40 AM [facility] DEVICE REMOTES [facility]  08/11/2021  7:40 AM [facility] DEVICE REMOTES [facility]  09/10/2021  7:40 AM  [facility] DEV[facility] REMOTES [facility]     ACTION: Home visit completed Next visit planned for one week

## 2020-04-24 ENCOUNTER — Encounter: Payer: Medicare HMO | Admitting: Internal Medicine

## 2020-04-25 ENCOUNTER — Other Ambulatory Visit: Payer: Self-pay

## 2020-04-25 ENCOUNTER — Ambulatory Visit (INDEPENDENT_AMBULATORY_CARE_PROVIDER_SITE_OTHER): Payer: Medicare HMO | Admitting: Emergency Medicine

## 2020-04-25 DIAGNOSIS — Z95810 Presence of automatic (implantable) cardiac defibrillator: Secondary | ICD-10-CM

## 2020-04-25 DIAGNOSIS — I5022 Chronic systolic (congestive) heart failure: Secondary | ICD-10-CM | POA: Diagnosis not present

## 2020-04-25 NOTE — Progress Notes (Signed)
Wound check appointment. Steri-strips removed. Wound without redness or edema. Incision edges approximated, wound well healed. Normal device function. Thresholds, sensing, and impedances consistent with implant measurements. Device programmed at chronic settings due to mature leads. Histogram distribution appropriate for patient and level of activity. No mode switches or ventricular arrhythmias noted. Patient educated about wound care, arm mobility, shock plan. ROV with Dr Lovena Le 07/25/20. Enrolled in remote monitoring and next remote scheduled 07/12/19.

## 2020-04-26 ENCOUNTER — Telehealth: Payer: Self-pay

## 2020-04-26 NOTE — Telephone Encounter (Signed)
Advised pt that the device she has is MRI compatible.  Noted to patient that there is a waiting period for MRI following implant/

## 2020-04-26 NOTE — Telephone Encounter (Signed)
Patient called in and wants to know if her ICD is NMRI compatible. Patient would like a call back

## 2020-04-29 ENCOUNTER — Ambulatory Visit: Payer: PRIVATE HEALTH INSURANCE | Admitting: Physical Therapy

## 2020-04-30 ENCOUNTER — Other Ambulatory Visit (HOSPITAL_COMMUNITY): Payer: Self-pay

## 2020-04-30 DIAGNOSIS — M7989 Other specified soft tissue disorders: Secondary | ICD-10-CM | POA: Diagnosis not present

## 2020-04-30 NOTE — Progress Notes (Signed)
Paramedicine Encounter    Patient ID: Rhonda [name], female    DOB: 1950-07-11, 69 y.o.   MRN: [id number]   Patient Care Team: Maurice Small, MD as PCP - General (Family Medicine) [name], LCSW as Social Worker (Licensed Clinical Social Worker) [name], [name], DO as Consulting Physician (Neurology)  Patient Active Problem List   Diagnosis Date Noted  . Diabetes mellitus ([place]) 06/15/2019  . Hypokalemia 11/08/2018  . CKD (chronic kidney disease) stage 3, GFR 30-59 ml/min (HCC) 11/08/2018  . Orthostatic hypotension 07/28/2017  . SVD (spontaneous vaginal delivery)   . Peripheral neuropathy   . On home oxygen therapy   . Migraines   . Left bundle branch block   . Hypothyroidism   . Hypertension   . Hyperlipidemia   . Heart murmur   . GERD (gastroesophageal reflux disease)   . Exertional shortness of breath   . Major depressive disorder   . Back pain   . Arthritis   . Generalized anxiety disorder   . Anemia   . Chronic respiratory failure ([place]) 09/14/2013  . 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 Morbid obesity ([place]) 11/01/2012  . Chronic systolic heart failure ([place]) 10/27/2012  . Endometrial polyp 01/20/2012  . Malignant tumor of breast ([place]) 03/26/2011  . Unspecified vitamin D deficiency 03/26/2011    Current Outpatient Medications:  .  acetaZOLAMIDE (DIAMOX) 125 MG tablet, TAKE 1 TABLET(125 MG) BY MOUTH DAILY, Disp: 90 tablet, Rfl: 3 .  carvedilol (COREG) 3.125 MG tablet, TAKE 1 TABLET(3.125 MG) BY MOUTH TWICE DAILY WITH A MEAL, Disp: 180 tablet, Rfl: 3 .  citalopram (CELEXA) 20 MG tablet, Take 20 mg by mouth daily., Disp: , Rfl:  .  dicyclomine (BENTYL) 20 MG tablet, Take 20 mg by mouth every 6 (six) hours., Disp: , Rfl:  .  DULoxetine (CYMBALTA) 30 MG capsule, Take 30 mg by mouth daily. Take once in the morning., Disp: , Rfl:  .  fexofenadine (ALLEGRA) 180 MG tablet, Take 180 mg by mouth 2 (two) times daily. , Disp: , Rfl:   .  fludrocortisone (FLORINEF) 0.1 MG tablet, TAKE 1 TABLET(0.1 MG) BY MOUTH TWICE DAILY, Disp: 60 tablet, Rfl: 3 .  gabapentin (NEURONTIN) 300 MG capsule, Take 300 mg by mouth 3 (three) times daily. , Disp: , Rfl:  .  gabapentin (NEURONTIN) 800 MG tablet, Take 1 tablet (800 mg total) by mouth at bedtime as needed (neuropathy)., Disp: 90 tablet, Rfl: 1 .  Ginkgo Biloba 120 MG TABS, Take by mouth., Disp: , Rfl:  .  hydrOXYzine (ATARAX/VISTARIL) 25 MG tablet, Take 25 mg by mouth 2 (two) times daily. Takes Morning and Bedtime, Disp: , Rfl:  .  hydrOXYzine (VISTARIL) 25 MG capsule, Take 25 mg by mouth 3 (three) times daily as needed. , Disp: , Rfl:  .  ipratropium (ATROVENT) 0.03 % nasal spray, Place 2 sprays into both nostrils 2 (two) times daily., Disp: , Rfl:  .  levothyroxine (SYNTHROID) 88 MCG tablet, TAKE 1 TABLET(88 MCG) BY MOUTH DAILY BEFORE BREAKFAST, Disp: 90 tablet, Rfl: 1 .  metolazone (ZAROXOLYN) 2.5 MG tablet, Take 1 tablet by mouth every Tuesday, Disp: 12 tablet, Rfl: 3 .  midodrine (PROAMATINE) 10 MG tablet, Take 1 tablet (10 mg total) by mouth 3 (three) times daily with meals., Disp: 270 tablet, Rfl: 3 .  mirtazapine (REMERON) 30 MG tablet, TAKE 1 TABLET BY MOUTH EVERY NIGHT AT BEDTIME, Disp: 30 tablet, Rfl: 3 .  montelukast (SINGULAIR) 10  MG tablet, Take 10 mg by mouth at bedtime. , Disp: , Rfl: 1 .  omeprazole (PRILOSEC) 40 MG capsule, Take 40 mg by mouth daily., Disp: , Rfl:  .  potassium chloride SA (KLOR-CON) 20 MEQ tablet, Take 3 tablets (60 mEq total) by mouth 3 (three) times daily. Take an extra 62meq(2 tablets) on the days you take the metolazone., Disp: 300 tablet, Rfl: 3 .  probenecid (BENEMID) 500 MG tablet, Take 500 mg by mouth 2 (two) times daily., Disp: , Rfl:  .  simvastatin (ZOCOR) 10 MG tablet, Take 10 mg by mouth every evening. , Disp: , Rfl:  .  spironolactone (ALDACTONE) 25 MG tablet, Take 1 tablet (25 mg total) by mouth every evening., Disp: 90 tablet, Rfl: 3 .   torsemide (DEMADEX) 20 MG tablet, Take 3 tablets (60 mg total) by mouth 2 (two) times daily., Disp: 540 tablet, Rfl: 3 .  traMADol (ULTRAM) 50 MG tablet, Take by mouth every 6 (six) hours as needed., Disp: , Rfl:  Allergies  Allergen Reactions  . Ceftin Anaphylaxis    Face and throat swell   . Geodon [Ziprasidone Hcl] Hives  . Lisinopril Other (See Comments)    angioedema  . Shellfish Allergy Other (See Comments)    Gout exacerbation  . Allopurinol Nausea Only and Other (See Comments)    weakness  . Ativan [Lorazepam] Itching  . Sulfa Antibiotics Itching  . Ultram [Tramadol Hcl] Itching  . Valium [Diazepam] Other (See Comments)    Patient states that diazepam doesn't relax, it has the opposite effect.     Social History   Socioeconomic History  . Marital status: Married    Spouse name: Not on file  . Number of children: 2  . Years of education: 32  . Highest education level: Master's degree (e.g., MA, MS, MEng, MEd, MSW, MBA)  Occupational History  . Occupation: retired  Tobacco Use  . Smoking status: Current Some Day Smoker    Packs/day: 0.10    Years: 26.00    Pack years: 2.60    Types: Cigarettes    Last attempt to quit: 05/06/2007    Years since quitting: 12.9  . Smokeless tobacco: Never Used  Vaping Use  . Vaping Use: Never used  Substance and Sexual Activity  . Alcohol use: No  . Drug use: No  . Sexual activity: Yes  Other Topics Concern  . 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 Difficulty of Paying Living Expenses: Not on file  Food Insecurity:   . Worried About Sales executive in the Last Year: Not on file  . Ran Out of Food in the Last Year: Not on file  Transportation Needs:   . Lack of Transportation (Medical): Not on file  . Lack of Transportation (Non-Medical): Not on file  Physical Activity:   . Days of Exercise per Week: Not on file  . Minutes of Exercise per Session: Not on file  Stress:   . Feeling of Stress :  Not on file  Social Connections:   . Frequency of Communication with Friends and Family: Not on file  . Frequency of Social Gatherings with Friends and Family: Not on file  . Attends Religious Services: Not on file  . Active Member of Clubs or Organizations: Not on file  . Attends Archivist Meetings: Not on file  . Marital Status: Not on file  Intimate Partner Violence:   . Fear of Current or Ex-Partner: Not on file  . Emotionally Abused: Not on file  . Physically Abused: Not on file  . Sexually Abused: Not on file    Physical Exam Vitals reviewed.  HENT:     Head: Normocephalic.     Nose: Nose normal.     Mouth/Throat:     Mouth: Mucous membranes are moist.     Pharynx: Oropharynx is clear.  Eyes:     Pupils: Pupils are equal, round, and reactive to light.  Cardiovascular:     Rate and Rhythm: Normal rate and regular rhythm.     Pulses: Normal pulses.     Heart sounds: Normal heart sounds.  Pulmonary:     Effort: Pulmonary effort is normal. No respiratory distress.     Breath sounds: Normal breath sounds. No stridor. No wheezing, rhonchi or rales.  Chest:     Chest wall: No tenderness.  Abdominal:     General: Abdomen is flat.     Palpations: Abdomen is soft.  Musculoskeletal:        General: Swelling and tenderness present. Normal range of motion.     Cervical back: Normal range of motion.     Right lower leg: Edema present.     Left lower leg: Edema present.     Comments: Nickel sized wound on the backs of lower legs. Bilaterally.  Skin:    General: Skin is warm and dry.     Capillary Refill: Capillary  refill takes less than 2 seconds.  Neurological:     General: No focal deficit present.     Mental Status: She is alert. Mental status is at baseline.  Psychiatric:        Mood and Affect: Mood normal.     Arrived for home visit for [facility] who was alert seated in her kitchen. Dayami reports she has been feeling okay but has some wounds on the backs of her legs. PCP is following up on same today for a virtual visit with Dr. Justin Mend. I will continue to assist. Brayley has swelling and tenderness to both legs. Weight down two lbs this week. Vitals obtained and BP noted to be elevated however she just now took her morning meds at 1500. Allycia was educated on importance of taking medications at the times they are prescribed. [name] agreed and understood. ICD site healing well. Stepahnie has a MRI compatible ICD and can resume XRAYS/MRI's in three months. I will update her spine doctor. Medications were verified and pill box filled accordingly. [name] and I reviewed upcoming appointments and confirmed same. Home visit complete. I will see Selda in one week.   Refills: Multivitamin OTC     Future Appointments  Date Time Provider [place]  05/13/2020  2:00 PM Su Hoff, PT [facility]  05/20/2020 12:30 PM Desenglau, Tommy Rainwater, PT [facility]  06/12/2020 10:00 AM Philemon Kingdom, MD [facility] None  07/11/2020  7:40 AM [facility] DEVICE REMOTES [facility]  07/25/2020  2:00 PM Evans Lance, MD [facility]  10/10/2020  7:40 AM [facility] DEVICE REMOTES [facility]  01/09/2021  7:40 AM [facility] DEVICE REMOTES [facility]  02/27/2021  9:50 AM Narda Amber K, DO [facility] None  04/10/2021  7:40 AM [facility] DEVICE REMOTES [facility]  07/10/2021  7:40 AM [facility]ICE REMOTES [facility]  08/11/2021  7:40 AM [facility]ICE REMOTES [facility]  09/10/2021  7:40 AM  [facility] REMOTES [facility]     ACTION: Home visit completed Next visit planned for one week

## 2020-05-02 ENCOUNTER — Other Ambulatory Visit: Payer: Self-pay | Admitting: Family Medicine

## 2020-05-06 ENCOUNTER — Ambulatory Visit: Payer: PRIVATE HEALTH INSURANCE | Admitting: Physical Therapy

## 2020-05-07 ENCOUNTER — Ambulatory Visit: Payer: PRIVATE HEALTH INSURANCE

## 2020-05-07 ENCOUNTER — Other Ambulatory Visit (HOSPITAL_COMMUNITY): Payer: Self-pay

## 2020-05-07 NOTE — Progress Notes (Signed)
Paramedicine Encounter    Patient ID: Rhonda [name], female    DOB: [birth year], 69 y.o.   MRN: [id number]   Patient Care Team: Maurice Small, MD as PCP - General (Family Medicine) [name], LCSW as Social Worker (Licensed Clinical Social Worker) [name], [name], DO as Consulting Physician (Neurology)  Patient Active Problem List   Diagnosis Date Noted  . Diabetes mellitus ([place]) 06/15/2019  . Hypokalemia 11/08/2018  . CKD (chronic kidney disease) stage 3, GFR 30-59 ml/min (HCC) 11/08/2018  . Orthostatic hypotension 07/28/2017  . SVD (spontaneous vaginal delivery)   . Peripheral neuropathy   . On home oxygen therapy   . Migraines   . Left bundle branch block   . Hypothyroidism   . Hypertension   . Hyperlipidemia   . Heart murmur   . GERD (gastroesophageal reflux disease)   . Exertional shortness of breath   . Major depressive disorder   . Back pain   . Arthritis   . Generalized anxiety disorder   . Anemia   . Chronic respiratory failure ([place]) 09/14/2013  . 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 Morbid obesity ([place]) 11/01/2012  . Chronic systolic heart failure ([place]) 10/27/2012  . Endometrial polyp 01/20/2012  . Malignant tumor of breast ([place]) 03/26/2011  . Unspecified vitamin D deficiency 03/26/2011    Current Outpatient Medications:  .  acetaZOLAMIDE (DIAMOX) 125 MG tablet, TAKE 1 TABLET(125 MG) BY MOUTH DAILY, Disp: 90 tablet, Rfl: 3 .  carvedilol (COREG) 3.125 MG tablet, TAKE 1 TABLET(3.125 MG) BY MOUTH TWICE DAILY WITH A MEAL, Disp: 180 tablet, Rfl: 3 .  citalopram (CELEXA) 20 MG tablet, Take 20 mg by mouth daily., Disp: , Rfl:  .  dicyclomine (BENTYL) 20 MG tablet, Take 20 mg by mouth every 6 (six) hours., Disp: , Rfl:  .  DULoxetine (CYMBALTA) 30 MG capsule, Take 30 mg by mouth daily. Take once in the morning., Disp: , Rfl:  .  fexofenadine (ALLEGRA) 180 MG tablet, Take 180 mg by mouth 2 (two) times daily. , Disp: , Rfl:   .  fludrocortisone (FLORINEF) 0.1 MG tablet, TAKE 1 TABLET(0.1 MG) BY MOUTH TWICE DAILY, Disp: 60 tablet, Rfl: 3 .  gabapentin (NEURONTIN) 300 MG capsule, Take 300 mg by mouth 3 (three) times daily. , Disp: , Rfl:  .  gabapentin (NEURONTIN) 800 MG tablet, Take 1 tablet (800 mg total) by mouth at bedtime as needed (neuropathy)., Disp: 90 tablet, Rfl: 1 .  Ginkgo Biloba 120 MG TABS, Take by mouth., Disp: , Rfl:  .  hydrOXYzine (ATARAX/VISTARIL) 25 MG tablet, Take 25 mg by mouth 2 (two) times daily. Takes Morning and Bedtime, Disp: , Rfl:  .  hydrOXYzine (VISTARIL) 25 MG capsule, Take 25 mg by mouth 3 (three) times daily as needed. , Disp: , Rfl:  .  ipratropium (ATROVENT) 0.03 % nasal spray, Place 2 sprays into both nostrils 2 (two) times daily., Disp: , Rfl:  .  levothyroxine (SYNTHROID) 88 MCG tablet, TAKE 1 TABLET(88 MCG) BY MOUTH DAILY BEFORE BREAKFAST, Disp: 90 tablet, Rfl: 1 .  metolazone (ZAROXOLYN) 2.5 MG tablet, Take 1 tablet by mouth every Tuesday, Disp: 12 tablet, Rfl: 3 .  midodrine (PROAMATINE) 10 MG tablet, Take 1 tablet (10 mg total) by mouth 3 (three) times daily with meals., Disp: 270 tablet, Rfl: 3 .  mirtazapine (REMERON) 30 MG tablet, TAKE 1 TABLET BY MOUTH EVERY NIGHT AT BEDTIME, Disp: 30 tablet, Rfl: 3 .  montelukast (SINGULAIR) 10  MG tablet, Take 10 mg by mouth at bedtime. , Disp: , Rfl: 1 .  omeprazole (PRILOSEC) 40 MG capsule, Take 40 mg by mouth daily., Disp: , Rfl:  .  potassium chloride SA (KLOR-CON) 20 MEQ tablet, Take 3 tablets (60 mEq total) by mouth 3 (three) times daily. Take an extra 3meq(2 tablets) on the days you take the metolazone., Disp: 300 tablet, Rfl: 3 .  probenecid (BENEMID) 500 MG tablet, Take 500 mg by mouth 2 (two) times daily., Disp: , Rfl:  .  simvastatin (ZOCOR) 10 MG tablet, Take 10 mg by mouth every evening. , Disp: , Rfl:  .  spironolactone (ALDACTONE) 25 MG tablet, Take 1 tablet (25 mg total) by mouth every evening., Disp: 90 tablet, Rfl: 3 .   torsemide (DEMADEX) 20 MG tablet, Take 3 tablets (60 mg total) by mouth 2 (two) times daily., Disp: 540 tablet, Rfl: 3 .  traMADol (ULTRAM) 50 MG tablet, Take by mouth every 6 (six) hours as needed., Disp: , Rfl:  Allergies  Allergen Reactions  . Ceftin Anaphylaxis    Face and throat swell   . Geodon [Ziprasidone Hcl] Hives  . Lisinopril Other (See Comments)    angioedema  . Shellfish Allergy Other (See Comments)    Gout exacerbation  . Allopurinol Nausea Only and Other (See Comments)    weakness  . Ativan [Lorazepam] Itching  . Sulfa Antibiotics Itching  . Ultram [Tramadol Hcl] Itching  . Valium [Diazepam] Other (See Comments)    Patient states that diazepam doesn't relax, it has the opposite effect.     Social History   Socioeconomic History  . Marital status: Married    Spouse name: Not on file  . Number of children: 2  . Years of education: 20  . Highest education level: Master's degree (e.g., MA, MS, MEng, MEd, MSW, MBA)  Occupational History  . Occupation: retired  Tobacco Use  . Smoking status: Current Some Day Smoker    Packs/day: 0.10    Years: 26.00    Pack years: 2.60    Types: Cigarettes    Last attempt to quit: 05/06/2007    Years since quitting: 13.0  . Smokeless tobacco: Never Used  Vaping Use  . Vaping Use: Never used  Substance and Sexual Activity  . Alcohol use: No  . Drug use: No  . Sexual activity: Yes  Other Topics Concern  . 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  Constitutional:      Appearance: She is normal weight.  HENT:     Head: Normocephalic.     Nose: Nose normal.     Mouth/Throat:     Mouth: Mucous membranes are moist.     Pharynx: Oropharynx is clear.  Eyes:  Pupils: Pupils are equal, round, and reactive to light.  Cardiovascular:     Rate and Rhythm: Normal rate and regular rhythm.     Pulses: Normal pulses.     Heart sounds: Normal heart sounds.  Pulmonary:     Effort: Pulmonary effort is normal.     Breath sounds: Normal breath sounds.  Abdominal:     General: Abdomen is flat.     Palpations: Abdomen is soft.  Musculoskeletal:        General: Swelling present. Normal range of motion.     Cervical back: Normal range of motion.     Right lower leg: Edema present.     Left lower leg: Edema present.  Skin:    General: Skin is warm and dry.     Capillary Refill: Capillary refill takes less than 2 seconds.  Neurological:     General: No focal deficit present.     Mental Status: She is alert. Mental status is at baseline.  Psychiatric:        Mood and Affect: Mood normal.       Arrived for home visit for Agripina who was alert and oriented reporting she was feeling good but more anxious this week. She has an appointment coming up with psychiatrist next week. I reviewed medications and patient was compliant with medications over the last week. Medications confirmed and pill box filled accordingly. I gave Jabree printed updated list of current medications including name of medicine, dose and where it goes in her pill box. Tyriana was thankful for same. I obtained vitals. Legs were swollen but wounds were healing and looking better. PCP placed patient on Doxcycline for same for 10 days. Patient and I reviewed appointments and confirmed same. Home visit complete. I will see Jestine in one week.    Refills: OTCDelma Freeze    Future Appointments  Date Time Provider [place]  05/13/2020  2:00 PM Desenglau, Tommy Rainwater, PT [facility]  05/20/2020 12:30 PM Desenglau, Tommy Rainwater, PT [facility]  06/12/2020 10:00 AM Philemon Kingdom, MD [facility] None  07/11/2020  7:40 AM [facility] DEVICE REMOTES [facility]  07/25/2020  2:00 PM Evans Lance, MD [facility]  10/10/2020  7:40 AM [facility] DEVICE REMOTES [facility]  01/09/2021  7:40 AM [facility] DEVICE REMOTES [facility]  02/27/2021  9:50 AM [name], Arvin Collard K, DO [facility] None  04/10/2021  7:40 AM [facility] DEVICE REMOTES [facility]  07/10/2021  7:40 AM [facility] DEVICE REMOTES [facility]  08/11/2021  7:40 AM [facility] DEVICE REMOTES [facility]  09/10/2021  7:40 AM [facility] DEVICE REMOTES [facility]     ACTION: Home visit completed Next visit planned for one week

## 2020-05-09 ENCOUNTER — Other Ambulatory Visit: Payer: Self-pay | Admitting: Family Medicine

## 2020-05-09 DIAGNOSIS — M7989 Other specified soft tissue disorders: Secondary | ICD-10-CM

## 2020-05-10 ENCOUNTER — Ambulatory Visit
Admission: RE | Admit: 2020-05-10 | Discharge: 2020-05-10 | Disposition: A | Discharge status: Home / Self Care | Payer: Medicare HMO | Source: Ambulatory Visit | Attending: Family Medicine | Admitting: Family Medicine

## 2020-05-10 DIAGNOSIS — R600 Localized edema: Secondary | ICD-10-CM | POA: Diagnosis not present

## 2020-05-10 DIAGNOSIS — M7989 Other specified soft tissue disorders: Secondary | ICD-10-CM

## 2020-05-13 ENCOUNTER — Ambulatory Visit: Payer: PRIVATE HEALTH INSURANCE | Attending: Obstetrics and Gynecology | Admitting: Physical Therapy

## 2020-05-14 ENCOUNTER — Other Ambulatory Visit (HOSPITAL_COMMUNITY): Payer: Self-pay

## 2020-05-14 NOTE — Progress Notes (Signed)
Paramedicine Encounter    Patient ID: Rhonda [name], female    DOB: 03/28/1951, 69 y.o.   MRN: [id number]   Patient Care Team: Maurice Small, MD as PCP - General (Family Medicine) [name], LCSW as Social Worker (Licensed Clinical Social Worker) [name], [name], DO as Consulting Physician (Neurology)  Patient Active Problem List   Diagnosis Date Noted  . Diabetes mellitus ([place]) 06/15/2019  . Hypokalemia 11/08/2018  . CKD (chronic kidney disease) stage 3, GFR 30-59 ml/min (HCC) 11/08/2018  . Orthostatic hypotension 07/28/2017  . SVD (spontaneous vaginal delivery)   . Peripheral neuropathy   . On home oxygen therapy   . Migraines   . Left bundle branch block   . Hypothyroidism   . Hypertension   . Hyperlipidemia   . Heart murmur   . GERD (gastroesophageal reflux disease)   . Exertional shortness of breath   . Major depressive disorder   . Back pain   . Arthritis   . Generalized anxiety disorder   . Anemia   . Chronic respiratory failure ([place]) 09/14/2013  . 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 Morbid obesity ([place]) 11/01/2012  . Chronic systolic heart failure ([place]) 10/27/2012  . Endometrial polyp 01/20/2012  . Malignant tumor of breast ([place]) 03/26/2011  . Unspecified vitamin D deficiency 03/26/2011    Current Outpatient Medications:  .  acetaZOLAMIDE (DIAMOX) 125 MG tablet, TAKE 1 TABLET(125 MG) BY MOUTH DAILY, Disp: 90 tablet, Rfl: 3 .  carvedilol (COREG) 3.125 MG tablet, TAKE 1 TABLET(3.125 MG) BY MOUTH TWICE DAILY WITH A MEAL, Disp: 180 tablet, Rfl: 3 .  citalopram (CELEXA) 20 MG tablet, Take 20 mg by mouth daily., Disp: , Rfl:  .  dicyclomine (BENTYL) 20 MG tablet, Take 20 mg by mouth every 6 (six) hours., Disp: , Rfl:  .  DULoxetine (CYMBALTA) 30 MG capsule, Take 30 mg by mouth daily. Take once in the morning., Disp: , Rfl:  .  fexofenadine (ALLEGRA) 180 MG tablet, Take 180 mg by mouth 2 (two) times daily. , Disp: , Rfl:   .  fludrocortisone (FLORINEF) 0.1 MG tablet, TAKE 1 TABLET(0.1 MG) BY MOUTH TWICE DAILY, Disp: 60 tablet, Rfl: 3 .  gabapentin (NEURONTIN) 300 MG capsule, Take 300 mg by mouth 3 (three) times daily. , Disp: , Rfl:  .  gabapentin (NEURONTIN) 800 MG tablet, Take 1 tablet (800 mg total) by mouth at bedtime as needed (neuropathy)., Disp: 90 tablet, Rfl: 1 .  Ginkgo Biloba 120 MG TABS, Take by mouth., Disp: , Rfl:  .  hydrOXYzine (ATARAX/VISTARIL) 25 MG tablet, Take 25 mg by mouth 2 (two) times daily. Takes Morning and Bedtime, Disp: , Rfl:  .  hydrOXYzine (VISTARIL) 25 MG capsule, Take 25 mg by mouth 3 (three) times daily as needed. , Disp: , Rfl:  .  ipratropium (ATROVENT) 0.03 % nasal spray, Place 2 sprays into both nostrils 2 (two) times daily., Disp: , Rfl:  .  levothyroxine (SYNTHROID) 88 MCG tablet, TAKE 1 TABLET(88 MCG) BY MOUTH DAILY BEFORE BREAKFAST, Disp: 90 tablet, Rfl: 1 .  metolazone (ZAROXOLYN) 2.5 MG tablet, Take 1 tablet by mouth every Tuesday, Disp: 12 tablet, Rfl: 3 .  midodrine (PROAMATINE) 10 MG tablet, Take 1 tablet (10 mg total) by mouth 3 (three) times daily with meals., Disp: 270 tablet, Rfl: 3 .  mirtazapine (REMERON) 30 MG tablet, TAKE 1 TABLET BY MOUTH EVERY NIGHT AT BEDTIME, Disp: 30 tablet, Rfl: 3 .  montelukast (SINGULAIR) 10  MG tablet, Take 10 mg by mouth at bedtime. , Disp: , Rfl: 1 .  omeprazole (PRILOSEC) 40 MG capsule, Take 40 mg by mouth daily., Disp: , Rfl:  .  potassium chloride SA (KLOR-CON) 20 MEQ tablet, Take 3 tablets (60 mEq total) by mouth 3 (three) times daily. Take an extra 63meq(2 tablets) on the days you take the metolazone., Disp: 300 tablet, Rfl: 3 .  probenecid (BENEMID) 500 MG tablet, Take 500 mg by mouth 2 (two) times daily., Disp: , Rfl:  .  simvastatin (ZOCOR) 10 MG tablet, Take 10 mg by mouth every evening., Disp: , Rfl:  .  spironolactone (ALDACTONE) 25 MG tablet, Take 1 tablet (25 mg total) by mouth every evening., Disp: 90 tablet, Rfl: 3 .   torsemide (DEMADEX) 20 MG tablet, Take 3 tablets (60 mg total) by mouth 2 (two) times daily., Disp: 540 tablet, Rfl: 3 .  traMADol (ULTRAM) 50 MG tablet, Take by mouth every 6 (six) hours as needed. (Patient not taking: Reported on 05/07/2020), Disp: , Rfl:  Allergies  Allergen Reactions  . Ceftin Anaphylaxis    Face and throat swell   . Geodon [Ziprasidone Hcl] Hives  . Lisinopril Other (See Comments)    angioedema  . Shellfish Allergy Other (See Comments)    Gout exacerbation  . Allopurinol Nausea Only and Other (See Comments)    weakness  . Ativan [Lorazepam] Itching  . Sulfa Antibiotics Itching  . Ultram [Tramadol Hcl] Itching  . Valium [Diazepam] Other (See Comments)    Patient states that diazepam doesn't relax, it has the opposite effect.     Social History   Socioeconomic History  . Marital status: Married    Spouse name: Not on file  . Number of children: 2  . Years of education: 23  . Highest education level: Master's degree (e.g., MA, MS, MEng, MEd, MSW, MBA)  Occupational History  . Occupation: retired  Tobacco Use  . Smoking status: Current Some Day Smoker    Packs/day: 0.10    Years: 26.00    Pack years: 2.60    Types: Cigarettes    Last attempt to quit: 05/06/2007    Years since quitting: 13.0  . Smokeless tobacco: Never Used  Vaping Use  . Vaping Use: Never used  Substance and Sexual Activity  . Alcohol use: No  . Drug use: No  . Sexual activity: Yes  Other Topics Concern  . 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  Constitutional:      Appearance: She is normal weight.  HENT:     Head: Normocephalic.     Nose: Nose normal.     Mouth/Throat:     Mouth: Mucous membranes are moist.  Eyes:  Pupils: Pupils are equal, round, and reactive to light.  Cardiovascular:     Rate and Rhythm: Normal rate and regular rhythm.     Pulses: Normal pulses.     Heart sounds: Normal heart sounds.  Pulmonary:     Effort: Pulmonary effort is normal.     Breath sounds: Normal breath sounds.  Abdominal:     General: Abdomen is flat.     Palpations: Abdomen is soft.  Musculoskeletal:        General: Normal range of motion.     Cervical back: Normal range of motion.     Right lower leg: No edema.     Left lower leg: No edema.  Skin:    General: Skin is warm and dry.     Capillary Refill: Capillary refill takes less than 2 seconds.  Neurological:     General: No focal deficit present.     Mental Status: She is alert. Mental status is at baseline.  Psychiatric:        Mood and Affect: Mood normal.      Arrived for home visit for Andrey who was alert and oriented reporting she was feeling okay today. Anaiah has been working on her weight loss and today weighs 202lbs. 10lbs down from our last visit. Vitals were obtained. Jasneet denied any dizziness, chest pain or shortness of breath. Legs were decreased in size and swelling was less than her normal. Lung sounds clear. Medications were reviewed and verified. Pill box filled accordingly. Appointments reviewed with patient. Home visit complete. I will see Alaiya in one week.   Refills: Bentyl    Future Appointments  Date Time Provider [place]  05/20/2020 12:30 PM Desenglau, Tommy Rainwater, PT [facility]  06/12/2020 10:00 AM Philemon Kingdom, MD [facility] None   07/11/2020  7:40 AM [facility] DEVICE REMOTES [facility]  07/25/2020  2:00 PM Evans Lance, MD [facility]  10/10/2020  7:40 AM [facility] DEVICE REMOTES [facility]  01/09/2021  7:40 AM [facility] DEVICE REMOTES [facility]  02/27/2021  9:50 AM [name], Arvin Collard K, DO [facility] None  04/10/2021  7:40 AM [facility] DEVICE REMOTES [facility]  07/10/2021  7:40 AM [facility] DEVICE REMOTES [facility]  08/11/2021  7:40 AM [facility] DEVICE REMOTES [facility]  09/10/2021  7:40 AM [facility] DEVICE REMOTES [facility]     ACTION: Home visit completed Next visit planned for one week

## 2020-05-16 DIAGNOSIS — F0632 Mood disorder due to known physiological condition with major depressive-like episode: Secondary | ICD-10-CM | POA: Diagnosis not present

## 2020-05-16 DIAGNOSIS — F3342 Major depressive disorder, recurrent, in full remission: Secondary | ICD-10-CM | POA: Diagnosis not present

## 2020-05-20 ENCOUNTER — Ambulatory Visit: Payer: PRIVATE HEALTH INSURANCE | Admitting: Physical Therapy

## 2020-05-21 ENCOUNTER — Other Ambulatory Visit (HOSPITAL_COMMUNITY): Payer: Self-pay

## 2020-05-21 NOTE — Progress Notes (Signed)
Paramedicine Encounter    Patient ID: Rhonda [name], female    DOB: 1950-07-11, 69 y.o.   MRN: [id number]   Patient Care Team: Maurice Small, MD as PCP - General (Family Medicine) [name], LCSW as Social Worker (Licensed Clinical Social Worker) [name], [name], DO as Consulting Physician (Neurology)  Patient Active Problem List   Diagnosis Date Noted  . Diabetes mellitus ([place]) 06/15/2019  . Hypokalemia 11/08/2018  . CKD (chronic kidney disease) stage 3, GFR 30-59 ml/min (HCC) 11/08/2018  . Orthostatic hypotension 07/28/2017  . SVD (spontaneous vaginal delivery)   . Peripheral neuropathy   . On home oxygen therapy   . Migraines   . Left bundle branch block   . Hypothyroidism   . Hypertension   . Hyperlipidemia   . Heart murmur   . GERD (gastroesophageal reflux disease)   . Exertional shortness of breath   . Major depressive disorder   . Back pain   . Arthritis   . Generalized anxiety disorder   . Anemia   . Chronic respiratory failure ([place]) 09/14/2013  . 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 Morbid obesity ([place]) 11/01/2012  . Chronic systolic heart failure ([place]) 10/27/2012  . Endometrial polyp 01/20/2012  . Malignant tumor of breast ([place]) 03/26/2011  . Unspecified vitamin D deficiency 03/26/2011    Current Outpatient Medications:  .  acetaZOLAMIDE (DIAMOX) 125 MG tablet, TAKE 1 TABLET(125 MG) BY MOUTH DAILY, Disp: 90 tablet, Rfl: 3 .  carvedilol (COREG) 3.125 MG tablet, TAKE 1 TABLET(3.125 MG) BY MOUTH TWICE DAILY WITH A MEAL, Disp: 180 tablet, Rfl: 3 .  citalopram (CELEXA) 20 MG tablet, Take 20 mg by mouth daily., Disp: , Rfl:  .  dicyclomine (BENTYL) 20 MG tablet, Take 20 mg by mouth every 6 (six) hours., Disp: , Rfl:  .  DULoxetine (CYMBALTA) 30 MG capsule, Take 30 mg by mouth daily. Take once in the morning., Disp: , Rfl:  .  fexofenadine (ALLEGRA) 180 MG tablet, Take 180 mg by mouth 2 (two) times daily. , Disp: , Rfl:   .  fludrocortisone (FLORINEF) 0.1 MG tablet, TAKE 1 TABLET(0.1 MG) BY MOUTH TWICE DAILY, Disp: 60 tablet, Rfl: 3 .  gabapentin (NEURONTIN) 300 MG capsule, Take 300 mg by mouth 3 (three) times daily. , Disp: , Rfl:  .  gabapentin (NEURONTIN) 800 MG tablet, Take 1 tablet (800 mg total) by mouth at bedtime as needed (neuropathy)., Disp: 90 tablet, Rfl: 1 .  Ginkgo Biloba 120 MG TABS, Take by mouth., Disp: , Rfl:  .  hydrOXYzine (ATARAX/VISTARIL) 25 MG tablet, Take 25 mg by mouth 2 (two) times daily. Takes Morning and Bedtime, Disp: , Rfl:  .  hydrOXYzine (VISTARIL) 25 MG capsule, Take 25 mg by mouth 3 (three) times daily as needed. , Disp: , Rfl:  .  ipratropium (ATROVENT) 0.03 % nasal spray, Place 2 sprays into both nostrils 2 (two) times daily., Disp: , Rfl:  .  levothyroxine (SYNTHROID) 88 MCG tablet, TAKE 1 TABLET(88 MCG) BY MOUTH DAILY BEFORE BREAKFAST, Disp: 90 tablet, Rfl: 1 .  metolazone (ZAROXOLYN) 2.5 MG tablet, Take 1 tablet by mouth every Tuesday, Disp: 12 tablet, Rfl: 3 .  midodrine (PROAMATINE) 10 MG tablet, Take 1 tablet (10 mg total) by mouth 3 (three) times daily with meals., Disp: 270 tablet, Rfl: 3 .  mirtazapine (REMERON) 30 MG tablet, TAKE 1 TABLET BY MOUTH EVERY NIGHT AT BEDTIME, Disp: 30 tablet, Rfl: 3 .  montelukast (SINGULAIR) 10  MG tablet, Take 10 mg by mouth at bedtime. , Disp: , Rfl: 1 .  omeprazole (PRILOSEC) 40 MG capsule, Take 40 mg by mouth daily., Disp: , Rfl:  .  potassium chloride SA (KLOR-CON) 20 MEQ tablet, Take 3 tablets (60 mEq total) by mouth 3 (three) times daily. Take an extra 49meq(2 tablets) on the days you take the metolazone., Disp: 300 tablet, Rfl: 3 .  probenecid (BENEMID) 500 MG tablet, Take 500 mg by mouth 2 (two) times daily., Disp: , Rfl:  .  simvastatin (ZOCOR) 10 MG tablet, Take 10 mg by mouth every evening., Disp: , Rfl:  .  spironolactone (ALDACTONE) 25 MG tablet, Take 1 tablet (25 mg total) by mouth every evening., Disp: 90 tablet, Rfl: 3 .   torsemide (DEMADEX) 20 MG tablet, Take 3 tablets (60 mg total) by mouth 2 (two) times daily., Disp: 540 tablet, Rfl: 3 .  traMADol (ULTRAM) 50 MG tablet, Take by mouth every 6 (six) hours as needed. (Patient not taking: No sig reported), Disp: , Rfl:  Allergies  Allergen Reactions  . Ceftin Anaphylaxis    Face and throat swell   . Geodon [Ziprasidone Hcl] Hives  . Lisinopril Other (See Comments)    angioedema  . Shellfish Allergy Other (See Comments)    Gout exacerbation  . Allopurinol Nausea Only and Other (See Comments)    weakness  . Ativan [Lorazepam] Itching  . Sulfa Antibiotics Itching  . Ultram [Tramadol Hcl] Itching  . Valium [Diazepam] Other (See Comments)    Patient states that diazepam doesn't relax, it has the opposite effect.     Social History   Socioeconomic History  . Marital status: Married    Spouse name: Not on file  . Number of children: 2  . Years of education: 63  . Highest education level: Master's degree (e.g., MA, MS, MEng, MEd, MSW, MBA)  Occupational History  . Occupation: retired  Tobacco Use  . Smoking status: Current Some Day Smoker    Packs/day: 0.10    Years: 26.00    Pack years: 2.60    Types: Cigarettes    Last attempt to quit: 05/06/2007    Years since quitting: 13.0  . Smokeless tobacco: Never Used  Vaping Use  . Vaping Use: Never used  Substance and Sexual Activity  . Alcohol use: No  . Drug use: No  . Sexual activity: Yes  Other Topics Concern  . Not on file  Social History Narrative   Tobacco Use Cigarettes: Former Smoker, Quit in 2008   No Alcohol   No recreational drug use   Diet: Regular/Low Carb   Exercise: None   Occupation: disabled   Education: Company secretary, masters   Children: 2   Firearms: No   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      Social Determinants of Health   Financial Resource Strain: Not on file  Food Insecurity: Not on file  Transportation Needs: Not  on file  Physical Activity: Not on file  Stress: Not on file  Social Connections: Not on file  Intimate Partner Violence: Not on file    Physical Exam Vitals reviewed.  Constitutional:      Appearance: She is normal weight.  HENT:     Head: Normocephalic.     Nose: Nose normal.     Mouth/Throat:     Mouth: Mucous membranes are moist.     Pharynx:  Oropharynx is clear.  Eyes:     Pupils: Pupils are equal, round, and reactive to light.  Cardiovascular:     Rate and Rhythm: Normal rate and regular rhythm.     Pulses: Normal pulses.     Heart sounds: Normal heart sounds.  Pulmonary:     Effort: Pulmonary effort is normal.     Breath sounds: Normal breath sounds.  Abdominal:     Palpations: Abdomen is soft.  Musculoskeletal:        General: Swelling present. Normal range of motion.     Cervical back: Normal range of motion.     Right lower leg: Edema present.     Left lower leg: Edema present.  Skin:    General: Skin is warm and dry.     Capillary Refill: Capillary refill takes less than 2 seconds.  Neurological:     General: No focal deficit present.     Mental Status: She is alert. Mental status is at baseline.  Psychiatric:        Mood and Affect: Mood normal.     Arrived for home visit for Deija who was alert and oriented reporting feeling okay overall. Serinity was compliant with all medications over the last week. Lower legs were swollen for her normal and wounds on back of lower legs were present on the left in open but no signs of infection, wound on right healed. Vitals were obtained and are as noted. Smaya denied any issues with shortness of breath, chest pain, dizziness or recent illness/trauma. Medications were reviewed and verified. Pill box filled accordingly. I reviewed appointments with patient and confirmed same writing them down for her on paper and placed on refrigerator. Lanore reports frequent diarrhea and taking imodium daily. I will message Dr. Justin Mend to  notify her and see if GI referral is needed. Home visit complete. I will se Henessey in one week.   Refills: Hydroxyzine     Future Appointments  Date Time Provider [place]  06/12/2020 10:00 AM Philemon Kingdom, MD [facility] None  07/11/2020  7:40 AM [facility] DEVICE REMOTES [facility]  07/25/2020  2:00 PM Evans Lance, MD [facility]  10/10/2020  7:40 AM [facility] DEVICE REMOTES [facility]  01/09/2021  7:40 AM [facility] DEVICE REMOTES [facility]  02/27/2021  9:50 AM Narda Amber K, DO [facility] None  04/10/2021  7:40 AM [facility] DEVICE REMOTES [facility]  07/10/2021  7:40 AM [facility] DEVICE REMOTES [facility]  08/11/2021  7:40 AM [facility] DEVICE REMOTES [facility]  09/10/2021  7:40 AM [facility] DEVICE REMOTES [facility]     ACTION: Home visit completed Next visit planned for one week

## 2020-05-26 ENCOUNTER — Other Ambulatory Visit (HOSPITAL_COMMUNITY): Payer: Self-pay | Admitting: Internal Medicine

## 2020-05-26 DIAGNOSIS — E1140 Type 2 diabetes mellitus with diabetic neuropathy, unspecified: Secondary | ICD-10-CM | POA: Diagnosis not present

## 2020-05-26 DIAGNOSIS — I5022 Chronic systolic (congestive) heart failure: Secondary | ICD-10-CM | POA: Diagnosis not present

## 2020-05-26 DIAGNOSIS — I130 Hypertensive heart and chronic kidney disease with heart failure and stage 1 through stage 4 chronic kidney disease, or unspecified chronic kidney disease: Secondary | ICD-10-CM | POA: Diagnosis not present

## 2020-05-26 DIAGNOSIS — R519 Headache, unspecified: Secondary | ICD-10-CM | POA: Diagnosis not present

## 2020-05-26 DIAGNOSIS — J449 Chronic obstructive pulmonary disease, unspecified: Secondary | ICD-10-CM | POA: Diagnosis not present

## 2020-05-26 DIAGNOSIS — N183 Chronic kidney disease, stage 3 unspecified: Secondary | ICD-10-CM | POA: Diagnosis not present

## 2020-05-26 DIAGNOSIS — G4700 Insomnia, unspecified: Secondary | ICD-10-CM | POA: Diagnosis not present

## 2020-05-26 DIAGNOSIS — F32A Depression, unspecified: Secondary | ICD-10-CM | POA: Diagnosis not present

## 2020-05-26 DIAGNOSIS — M48061 Spinal stenosis, lumbar region without neurogenic claudication: Secondary | ICD-10-CM | POA: Diagnosis not present

## 2020-05-27 ENCOUNTER — Encounter (HOSPITAL_COMMUNITY): Payer: Medicare HMO

## 2020-05-27 DIAGNOSIS — N183 Chronic kidney disease, stage 3 unspecified: Secondary | ICD-10-CM | POA: Diagnosis not present

## 2020-05-27 DIAGNOSIS — G4700 Insomnia, unspecified: Secondary | ICD-10-CM | POA: Diagnosis not present

## 2020-05-27 DIAGNOSIS — E1140 Type 2 diabetes mellitus with diabetic neuropathy, unspecified: Secondary | ICD-10-CM | POA: Diagnosis not present

## 2020-05-27 DIAGNOSIS — F32A Depression, unspecified: Secondary | ICD-10-CM | POA: Diagnosis not present

## 2020-05-27 DIAGNOSIS — J449 Chronic obstructive pulmonary disease, unspecified: Secondary | ICD-10-CM | POA: Diagnosis not present

## 2020-05-27 DIAGNOSIS — I5022 Chronic systolic (congestive) heart failure: Secondary | ICD-10-CM | POA: Diagnosis not present

## 2020-05-27 DIAGNOSIS — M48061 Spinal stenosis, lumbar region without neurogenic claudication: Secondary | ICD-10-CM | POA: Diagnosis not present

## 2020-05-27 DIAGNOSIS — I130 Hypertensive heart and chronic kidney disease with heart failure and stage 1 through stage 4 chronic kidney disease, or unspecified chronic kidney disease: Secondary | ICD-10-CM | POA: Diagnosis not present

## 2020-05-27 DIAGNOSIS — R519 Headache, unspecified: Secondary | ICD-10-CM | POA: Diagnosis not present

## 2020-05-28 ENCOUNTER — Telehealth: Payer: Self-pay

## 2020-05-28 ENCOUNTER — Other Ambulatory Visit (HOSPITAL_COMMUNITY): Payer: Self-pay

## 2020-05-28 DIAGNOSIS — N183 Chronic kidney disease, stage 3 unspecified: Secondary | ICD-10-CM | POA: Diagnosis not present

## 2020-05-28 DIAGNOSIS — R519 Headache, unspecified: Secondary | ICD-10-CM | POA: Diagnosis not present

## 2020-05-28 DIAGNOSIS — E1140 Type 2 diabetes mellitus with diabetic neuropathy, unspecified: Secondary | ICD-10-CM | POA: Diagnosis not present

## 2020-05-28 DIAGNOSIS — I130 Hypertensive heart and chronic kidney disease with heart failure and stage 1 through stage 4 chronic kidney disease, or unspecified chronic kidney disease: Secondary | ICD-10-CM | POA: Diagnosis not present

## 2020-05-28 DIAGNOSIS — J449 Chronic obstructive pulmonary disease, unspecified: Secondary | ICD-10-CM | POA: Diagnosis not present

## 2020-05-28 DIAGNOSIS — I5022 Chronic systolic (congestive) heart failure: Secondary | ICD-10-CM | POA: Diagnosis not present

## 2020-05-28 DIAGNOSIS — G4700 Insomnia, unspecified: Secondary | ICD-10-CM | POA: Diagnosis not present

## 2020-05-28 DIAGNOSIS — F32A Depression, unspecified: Secondary | ICD-10-CM | POA: Diagnosis not present

## 2020-05-28 DIAGNOSIS — M48061 Spinal stenosis, lumbar region without neurogenic claudication: Secondary | ICD-10-CM | POA: Diagnosis not present

## 2020-05-28 NOTE — Telephone Encounter (Signed)
I called the pt to get her to send a transmission to pair her new ICD with her old monitor.

## 2020-05-28 NOTE — Progress Notes (Signed)
Paramedicine Encounter    Patient ID: Rhonda [name], female    DOB: 1950-07-11, 70 y.o.   MRN: [id number]   Patient Care Team: Maurice Small, MD as PCP - General (Family Medicine) [name], LCSW as Social Worker (Licensed Clinical Social Worker) [name], [name], DO as Consulting Physician (Neurology)  Patient Active Problem List   Diagnosis Date Noted  . Diabetes mellitus ([place]) 06/15/2019  . Hypokalemia 11/08/2018  . CKD (chronic kidney disease) stage 3, GFR 30-59 ml/min (HCC) 11/08/2018  . Orthostatic hypotension 07/28/2017  . SVD (spontaneous vaginal delivery)   . Peripheral neuropathy   . On home oxygen therapy   . Migraines   . Left bundle branch block   . Hypothyroidism   . Hypertension   . Hyperlipidemia   . Heart murmur   . GERD (gastroesophageal reflux disease)   . Exertional shortness of breath   . Major depressive disorder   . Back pain   . Arthritis   . Generalized anxiety disorder   . Anemia   . Chronic respiratory failure ([place]) 09/14/2013  . 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 Morbid obesity ([place]) 11/01/2012  . Chronic systolic heart failure ([place]) 10/27/2012  . Endometrial polyp 01/20/2012  . Malignant tumor of breast ([place]) 03/26/2011  . Unspecified vitamin D deficiency 03/26/2011    Current Outpatient Medications:  .  acetaZOLAMIDE (DIAMOX) 125 MG tablet, TAKE 1 TABLET(125 MG) BY MOUTH DAILY, Disp: 90 tablet, Rfl: 3 .  carvedilol (COREG) 3.125 MG tablet, TAKE 1 TABLET(3.125 MG) BY MOUTH TWICE DAILY WITH A MEAL, Disp: 180 tablet, Rfl: 3 .  citalopram (CELEXA) 20 MG tablet, Take 20 mg by mouth daily., Disp: , Rfl:  .  dicyclomine (BENTYL) 20 MG tablet, Take 20 mg by mouth every 6 (six) hours., Disp: , Rfl:  .  DULoxetine (CYMBALTA) 30 MG capsule, Take 30 mg by mouth daily. Take once in the morning., Disp: , Rfl:  .  fexofenadine (ALLEGRA) 180 MG tablet, Take 180 mg by mouth 2 (two) times daily. , Disp: , Rfl:   .  fludrocortisone (FLORINEF) 0.1 MG tablet, TAKE 1 TABLET(0.1 MG) BY MOUTH TWICE DAILY, Disp: 60 tablet, Rfl: 3 .  gabapentin (NEURONTIN) 300 MG capsule, Take 300 mg by mouth 3 (three) times daily. , Disp: , Rfl:  .  gabapentin (NEURONTIN) 800 MG tablet, Take 1 tablet (800 mg total) by mouth at bedtime as needed (neuropathy)., Disp: 90 tablet, Rfl: 1 .  Ginkgo Biloba 120 MG TABS, Take by mouth., Disp: , Rfl:  .  hydrOXYzine (ATARAX/VISTARIL) 25 MG tablet, Take 25 mg by mouth 2 (two) times daily. Takes Morning and Bedtime, Disp: , Rfl:  .  hydrOXYzine (VISTARIL) 25 MG capsule, Take 25 mg by mouth 3 (three) times daily as needed. , Disp: , Rfl:  .  ipratropium (ATROVENT) 0.03 % nasal spray, Place 2 sprays into both nostrils 2 (two) times daily., Disp: , Rfl:  .  levothyroxine (SYNTHROID) 88 MCG tablet, TAKE 1 TABLET(88 MCG) BY MOUTH DAILY BEFORE BREAKFAST, Disp: 90 tablet, Rfl: 1 .  metolazone (ZAROXOLYN) 2.5 MG tablet, Take 1 tablet by mouth every Tuesday, Disp: 12 tablet, Rfl: 3 .  midodrine (PROAMATINE) 10 MG tablet, Take 1 tablet (10 mg total) by mouth 3 (three) times daily with meals., Disp: 270 tablet, Rfl: 3 .  mirtazapine (REMERON) 30 MG tablet, TAKE 1 TABLET BY MOUTH EVERY NIGHT AT BEDTIME, Disp: 30 tablet, Rfl: 3 .  montelukast (SINGULAIR) 10  MG tablet, Take 10 mg by mouth at bedtime. , Disp: , Rfl: 1 .  omeprazole (PRILOSEC) 40 MG capsule, Take 40 mg by mouth daily., Disp: , Rfl:  .  potassium chloride SA (KLOR-CON) 20 MEQ tablet, TAKE 2 TABLETS THREE TIMES DAILY. TAKE AN EXTRA 2 TABLETS ON DAYS YOU TAKE METOLAZONE, Disp: 572 tablet, Rfl: 0 .  probenecid (BENEMID) 500 MG tablet, Take 500 mg by mouth 2 (two) times daily., Disp: , Rfl:  .  simvastatin (ZOCOR) 10 MG tablet, Take 10 mg by mouth every evening., Disp: , Rfl:  .  spironolactone (ALDACTONE) 25 MG tablet, Take 1 tablet (25 mg total) by mouth every evening., Disp: 90 tablet, Rfl: 3 .  torsemide (DEMADEX) 20 MG tablet, Take 3  tablets (60 mg total) by mouth 2 (two) times daily., Disp: 540 tablet, Rfl: 3 .  traMADol (ULTRAM) 50 MG tablet, Take by mouth every 6 (six) hours as needed. (Patient not taking: No sig reported), Disp: , Rfl:  Allergies  Allergen Reactions  . Ceftin Anaphylaxis    Face and throat swell   . Geodon [Ziprasidone Hcl] Hives  . Lisinopril Other (See Comments)    angioedema  . Shellfish Allergy Other (See Comments)    Gout exacerbation  . Allopurinol Nausea Only and Other (See Comments)    weakness  . Ativan [Lorazepam] Itching  . Sulfa Antibiotics Itching  . Ultram [Tramadol Hcl] Itching  . Valium [Diazepam] Other (See Comments)    Patient states that diazepam doesn't relax, it has the opposite effect.     Social History   Socioeconomic History  . Marital status: Married    Spouse name: Not on file  . Number of children: 2  . Years of education: 71  . Highest education level: Master's degree (e.g., MA, MS, MEng, MEd, MSW, MBA)  Occupational History  . Occupation: retired  Tobacco Use  . Smoking status: Current Some Day Smoker    Packs/day: 0.10    Years: 26.00    Pack years: 2.60    Types: Cigarettes    Last attempt to quit: 05/06/2007    Years since quitting: 13.0  . Smokeless tobacco: Never Used  Vaping Use  . Vaping Use: Never used  Substance and Sexual Activity  . Alcohol use: No  . Drug use: No  . Sexual activity: Yes  Other Topics Concern  . 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  Constitutional:      Appearance: She is normal weight.  HENT:     Head: Normocephalic.     Nose: Congestion and rhinorrhea present.     Mouth/Throat:     Mouth: Mucous membranes are moist.     Pharynx: Oropharynx is clear.  Eyes:  Conjunctiva/sclera: Conjunctivae normal.     Pupils: Pupils are equal, round, and reactive to light.  Cardiovascular:     Rate and Rhythm: Normal rate and regular rhythm.     Pulses: Normal pulses.     Heart sounds: Normal heart sounds.  Pulmonary:     Effort: Pulmonary effort is normal.     Breath sounds: Normal breath sounds.  Abdominal:     General: Abdomen is flat.     Palpations: Abdomen is soft.  Musculoskeletal:        General: Normal range of motion.     Cervical back: Normal range of motion.  Skin:    General: Skin is warm and dry.     Capillary Refill: Capillary refill takes less than 2 seconds.  Neurological:     General: No focal deficit present.     Mental Status: She is alert. Mental status is at baseline.  Psychiatric:        Mood and Affect: Mood normal.    Arrived for home visit for [facility] who was alert and oriented reporting some sinus drainage and congestion. Rhonda [name] has been without her Allegra for two weeks due to not picking it up from pharmacy yet. I reminded Rhonda [name] to do same asap. She agreed. Vitals were obtained. Weight up 4lbs in the last 7 days. Gaylene reports she has not been eating as good as she should, I reminded her of good low sodium options, she agreed. Medications were reviewed and confirmed. Pill box filled accordingly. No refills needed. I reviewed upcoming appointments with Rhonda [name] and she agreed and understood. Home visit complete. I will see Rhonda [name] in one week.        Future Appointments  Date Time Provider Department [place]  06/12/2020 10:00 AM Carlus Pavlov,  MD [facility] None  07/11/2020  7:40 AM [facility] DEVICE REMOTES [facility]  07/25/2020  2:00 PM Marinus Maw, MD [facility]  10/10/2020  7:40 AM [facility] DEVICE REMOTES [facility]  01/09/2021  7:40 AM [facility] DEVICE REMOTES [facility]  02/27/2021  9:50 AM Nita Sickle K, DO [facility] None  04/10/2021  7:40 AM [facility] DEVICE REMOTES [facility]  07/10/2021  7:40 AM [facility] DEVICE REMOTES [facility]  08/11/2021  7:40 AM [facility] DEVICE REMOTES [facility]  09/10/2021  7:40 AM [facility] DEVICE REMOTES [facility]     ACTION: Home visit completed Next visit planned for one week

## 2020-05-29 ENCOUNTER — Ambulatory Visit (INDEPENDENT_AMBULATORY_CARE_PROVIDER_SITE_OTHER): Payer: Medicare HMO

## 2020-05-29 DIAGNOSIS — Z95810 Presence of automatic (implantable) cardiac defibrillator: Secondary | ICD-10-CM

## 2020-05-29 DIAGNOSIS — M48061 Spinal stenosis, lumbar region without neurogenic claudication: Secondary | ICD-10-CM | POA: Diagnosis not present

## 2020-05-29 DIAGNOSIS — I5022 Chronic systolic (congestive) heart failure: Secondary | ICD-10-CM

## 2020-05-29 DIAGNOSIS — M4316 Spondylolisthesis, lumbar region: Secondary | ICD-10-CM | POA: Diagnosis not present

## 2020-05-29 NOTE — Progress Notes (Signed)
EPIC Encounter for ICM Monitoring  Patient Name: JANAN BOGIE is a 70 y.o. female Date: 05/29/2020 Primary Care Physican: Shirlean Mylar, MD Primary Cardiologist:Bensimhon Electrophysiologist: Rae Roam Pacing:  100% 9/14/2021OfficeWeight:207lbs   Transmission reviewed.   Optivol thoracic impedancenormal.  Prescribed: Patient participating in paramedicine program to assist with meds.  Torsemide 20 mg3tablets (60 mg total)by mouthtwice a day.  Potassium 20 meq2tablets (40 mEq total) by mouth 3 (three) times daily.Take extra 40 mEq on days you take Metolazone.  Metolazone 2.5mg  1 tabletevery Tuesday.  Spironolactone 25 mg take 1 tablet by mouth every evening.  Labs: 04/09/2020 Creatinine 1.44, BUN 40, Potassium 4.9, Sodium 142  03/27/2020 Creatinine 1.71, BUN 33, Potassium 3.8, Sodium 141, GFR 32  03/14/2020 Creatinine 1.67, BUN 30, Potassium 3.4, Sodium 139, GFR 33  A complete set of results can be found in Results Review.  Recommendations:No changes.  Follow-up plan: ICM clinic phone appointment on2/11/2020. 91 day device clinic remote transmission2/17/2022.   EP/Cardiology Office Visits: 07/25/2020 with Dr Ladona Ridgel.  Copy of ICM check sent to Dr.Taylor.   3 month ICM trend: 05/29/2020.    1 Year ICM trend:       Karie Soda, RN 05/29/2020 2:27 PM

## 2020-05-30 DIAGNOSIS — E1140 Type 2 diabetes mellitus with diabetic neuropathy, unspecified: Secondary | ICD-10-CM | POA: Diagnosis not present

## 2020-05-30 DIAGNOSIS — R519 Headache, unspecified: Secondary | ICD-10-CM | POA: Diagnosis not present

## 2020-05-30 DIAGNOSIS — I130 Hypertensive heart and chronic kidney disease with heart failure and stage 1 through stage 4 chronic kidney disease, or unspecified chronic kidney disease: Secondary | ICD-10-CM | POA: Diagnosis not present

## 2020-05-30 DIAGNOSIS — M48061 Spinal stenosis, lumbar region without neurogenic claudication: Secondary | ICD-10-CM | POA: Diagnosis not present

## 2020-05-30 DIAGNOSIS — F32A Depression, unspecified: Secondary | ICD-10-CM | POA: Diagnosis not present

## 2020-05-30 DIAGNOSIS — N183 Chronic kidney disease, stage 3 unspecified: Secondary | ICD-10-CM | POA: Diagnosis not present

## 2020-05-30 DIAGNOSIS — J449 Chronic obstructive pulmonary disease, unspecified: Secondary | ICD-10-CM | POA: Diagnosis not present

## 2020-05-30 DIAGNOSIS — G4700 Insomnia, unspecified: Secondary | ICD-10-CM | POA: Diagnosis not present

## 2020-05-30 DIAGNOSIS — I5022 Chronic systolic (congestive) heart failure: Secondary | ICD-10-CM | POA: Diagnosis not present

## 2020-06-03 DIAGNOSIS — T50901A Poisoning by unspecified drugs, medicaments and biological substances, accidental (unintentional), initial encounter: Secondary | ICD-10-CM | POA: Diagnosis not present

## 2020-06-04 ENCOUNTER — Other Ambulatory Visit (HOSPITAL_COMMUNITY): Payer: Self-pay

## 2020-06-04 NOTE — Progress Notes (Signed)
Paramedicine Encounter    Patient ID: Rhonda [name], female    DOB: 1950-07-11, 70 y.o.   MRN: [id number]   Patient Care Team: Maurice Small, MD as PCP - General (Family Medicine) [name], LCSW as Social Worker (Licensed Clinical Social Worker) [name], [name], DO as Consulting Physician (Neurology)  Patient Active Problem List   Diagnosis Date Noted  . Diabetes mellitus ([place]) 06/15/2019  . Hypokalemia 11/08/2018  . CKD (chronic kidney disease) stage 3, GFR 30-59 ml/min (HCC) 11/08/2018  . Orthostatic hypotension 07/28/2017  . SVD (spontaneous vaginal delivery)   . Peripheral neuropathy   . On home oxygen therapy   . Migraines   . Left bundle branch block   . Hypothyroidism   . Hypertension   . Hyperlipidemia   . Heart murmur   . GERD (gastroesophageal reflux disease)   . Exertional shortness of breath   . Major depressive disorder   . Back pain   . Arthritis   . Generalized anxiety disorder   . Anemia   . Chronic respiratory failure ([place]) 09/14/2013  . 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 Morbid obesity ([place]) 11/01/2012  . Chronic systolic heart failure ([place]) 10/27/2012  . Endometrial polyp 01/20/2012  . Malignant tumor of breast ([place]) 03/26/2011  . Unspecified vitamin D deficiency 03/26/2011    Current Outpatient Medications:  .  acetaZOLAMIDE (DIAMOX) 125 MG tablet, TAKE 1 TABLET(125 MG) BY MOUTH DAILY, Disp: 90 tablet, Rfl: 3 .  carvedilol (COREG) 3.125 MG tablet, TAKE 1 TABLET(3.125 MG) BY MOUTH TWICE DAILY WITH A MEAL, Disp: 180 tablet, Rfl: 3 .  citalopram (CELEXA) 20 MG tablet, Take 20 mg by mouth daily., Disp: , Rfl:  .  dicyclomine (BENTYL) 20 MG tablet, Take 20 mg by mouth every 6 (six) hours., Disp: , Rfl:  .  DULoxetine (CYMBALTA) 30 MG capsule, Take 30 mg by mouth daily. Take once in the morning., Disp: , Rfl:  .  fexofenadine (ALLEGRA) 180 MG tablet, Take 180 mg by mouth 2 (two) times daily. , Disp: , Rfl:   .  fludrocortisone (FLORINEF) 0.1 MG tablet, TAKE 1 TABLET(0.1 MG) BY MOUTH TWICE DAILY, Disp: 60 tablet, Rfl: 3 .  gabapentin (NEURONTIN) 300 MG capsule, Take 300 mg by mouth 3 (three) times daily. , Disp: , Rfl:  .  gabapentin (NEURONTIN) 800 MG tablet, Take 1 tablet (800 mg total) by mouth at bedtime as needed (neuropathy)., Disp: 90 tablet, Rfl: 1 .  Ginkgo Biloba 120 MG TABS, Take by mouth., Disp: , Rfl:  .  hydrOXYzine (ATARAX/VISTARIL) 25 MG tablet, Take 25 mg by mouth 2 (two) times daily. Takes Morning and Bedtime, Disp: , Rfl:  .  hydrOXYzine (VISTARIL) 25 MG capsule, Take 25 mg by mouth 3 (three) times daily as needed. , Disp: , Rfl:  .  ipratropium (ATROVENT) 0.03 % nasal spray, Place 2 sprays into both nostrils 2 (two) times daily., Disp: , Rfl:  .  levothyroxine (SYNTHROID) 88 MCG tablet, TAKE 1 TABLET(88 MCG) BY MOUTH DAILY BEFORE BREAKFAST, Disp: 90 tablet, Rfl: 1 .  metolazone (ZAROXOLYN) 2.5 MG tablet, Take 1 tablet by mouth every Tuesday, Disp: 12 tablet, Rfl: 3 .  midodrine (PROAMATINE) 10 MG tablet, Take 1 tablet (10 mg total) by mouth 3 (three) times daily with meals., Disp: 270 tablet, Rfl: 3 .  mirtazapine (REMERON) 30 MG tablet, TAKE 1 TABLET BY MOUTH EVERY NIGHT AT BEDTIME, Disp: 30 tablet, Rfl: 3 .  montelukast (SINGULAIR) 10  MG tablet, Take 10 mg by mouth at bedtime. , Disp: , Rfl: 1 .  omeprazole (PRILOSEC) 40 MG capsule, Take 40 mg by mouth daily., Disp: , Rfl:  .  potassium chloride SA (KLOR-CON) 20 MEQ tablet, TAKE 2 TABLETS THREE TIMES DAILY. TAKE AN EXTRA 2 TABLETS ON DAYS YOU TAKE METOLAZONE, Disp: 572 tablet, Rfl: 0 .  probenecid (BENEMID) 500 MG tablet, Take 500 mg by mouth 2 (two) times daily., Disp: , Rfl:  .  simvastatin (ZOCOR) 10 MG tablet, Take 10 mg by mouth every evening., Disp: , Rfl:  .  spironolactone (ALDACTONE) 25 MG tablet, Take 1 tablet (25 mg total) by mouth every evening., Disp: 90 tablet, Rfl: 3 .  torsemide (DEMADEX) 20 MG tablet, Take 3  tablets (60 mg total) by mouth 2 (two) times daily., Disp: 540 tablet, Rfl: 3 .  traMADol (ULTRAM) 50 MG tablet, Take by mouth every 6 (six) hours as needed., Disp: , Rfl:  Allergies  Allergen Reactions  . Ceftin Anaphylaxis    Face and throat swell   . Geodon [Ziprasidone Hcl] Hives  . Lisinopril Other (See Comments)    angioedema  . Shellfish Allergy Other (See Comments)    Gout exacerbation  . Allopurinol Nausea Only and Other (See Comments)    weakness  . Ativan [Lorazepam] Itching  . Sulfa Antibiotics Itching  . Ultram [Tramadol Hcl] Itching  . Valium [Diazepam] Other (See Comments)    Patient states that diazepam doesn't relax, it has the opposite effect.     Social History   Socioeconomic History  . Marital status: Married    Spouse name: Not on file  . Number of children: 2  . Years of education: 108  . Highest education level: Master's degree (e.g., MA, MS, MEng, MEd, MSW, MBA)  Occupational History  . Occupation: retired  Tobacco Use  . Smoking status: Current Some Day Smoker    Packs/day: 0.10    Years: 26.00    Pack years: 2.60    Types: Cigarettes    Last attempt to quit: 05/06/2007    Years since quitting: 13.0  . Smokeless tobacco: Never Used  Vaping Use  . Vaping Use: Never used  Substance and Sexual Activity  . Alcohol use: No  . Drug use: No  . Sexual activity: Yes  Other Topics Concern  . Not on file  Social History Narrative   Tobacco Use Cigarettes: Former Smoker, Quit in 2008   No Alcohol   No recreational drug use   Diet: Regular/Low Carb   Exercise: None   Occupation: disabled   Education: Research officer, political party, masters   Children: 2   Firearms: No   Therapist, art Use: 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  Constitutional:      Appearance: She is normal weight.  HENT:     Head: Normocephalic.     Nose: Congestion and rhinorrhea present.     Mouth/Throat:     Mouth: Mucous membranes are moist.     Pharynx: Oropharynx is clear.  Eyes:     Pupils: Pupils are  equal, round, and reactive to light.  Cardiovascular:     Rate and Rhythm: Normal rate and regular rhythm.     Pulses: Normal pulses.     Heart sounds: Normal heart sounds.  Pulmonary:     Effort: Pulmonary effort is normal.     Breath sounds: Normal breath sounds.  Abdominal:     Palpations: Abdomen is soft.  Musculoskeletal:        General: Normal range of motion.     Cervical back: Normal range of motion.     Right lower leg: No edema.     Left lower leg: No edema.  Skin:    General: Skin is warm and dry.     Capillary Refill: Capillary refill takes less than 2 seconds.  Neurological:     General: No focal deficit present.     Mental Status: She is alert. Mental status is at baseline.  Psychiatric:        Mood and Affect: Mood normal.    Arrived for home visit for Masae who was alert and oriented walking in her kitchen reporting she feels okay today. Irlene has had increased sinus drainage over the last several weeks but has not been taking her OTC allergy medication as instructed. Azariyah was reminded to pick up same from the pharmacy this week. She agreed. Vitals were obtained. Brigitt has had a 5 lb weight gain, she reports drinking more tea over the last week. We discussed this at length and she agreed to lower her amount of fluid intake.  Lower legs noted to be decreased in size and no redness,  swelling or edema noted. Leiani denied shortness of breath, chest pain, dizziness or nausea/vomiting   Royce reports she talked with her PCP after having side effects of increased drowsiness and trouble functioning after  her anxiety and depression medications were increased last week. PCP spoke to her psychiatrist and they agreed to return back to lowered dose. They also suggested Nekayla try some at home mental health exercising. She reports this is helping and she can note an improvement over the last few days.   Medications were reviewed and confirmed. Pill box filled accordingly. Graci was made aware of upcoming appointments and had them wrote down on her calendar. Kielee agreed to home visit in one week. Home visit complete. I will see her in one week.   REFILLS: POTASSIUM   Future Appointments  Date Time Provider [place]  06/12/2020 10:00 AM Philemon Kingdom, MD [facility] None  07/01/2020  7:50 AM [facility] DEVICE REMOTES [facility]  07/11/2020  7:40 AM [facility] DEVICE REMOTES [facility]  07/25/2020  2:00 PM Evans Lance, MD [facility]  10/10/2020  7:40 AM [facility] DEVICE REMOTES [facility]  01/09/2021  7:40 AM [facility] DEVICE REMOTES [facility]  02/27/2021  9:50 AM [name], Arvin Collard K, DO [facility] None  04/10/2021  7:40 AM [facility] DEVICE REMOTES [facility]  07/10/2021  7:40 AM [facility] DEVICE REMOTES [facility]  08/11/2021  7:40 AM [facility]ICE REMOTES [facility]  09/10/2021  7:40 AM [facility] DEVICE REMOTES [facility]     ACTION: Home visit completed Next visit planned for ONE WEEK

## 2020-06-08 DIAGNOSIS — I5022 Chronic systolic (congestive) heart failure: Secondary | ICD-10-CM | POA: Diagnosis not present

## 2020-06-08 DIAGNOSIS — E1140 Type 2 diabetes mellitus with diabetic neuropathy, unspecified: Secondary | ICD-10-CM | POA: Diagnosis not present

## 2020-06-08 DIAGNOSIS — M48061 Spinal stenosis, lumbar region without neurogenic claudication: Secondary | ICD-10-CM | POA: Diagnosis not present

## 2020-06-08 DIAGNOSIS — R519 Headache, unspecified: Secondary | ICD-10-CM | POA: Diagnosis not present

## 2020-06-08 DIAGNOSIS — I130 Hypertensive heart and chronic kidney disease with heart failure and stage 1 through stage 4 chronic kidney disease, or unspecified chronic kidney disease: Secondary | ICD-10-CM | POA: Diagnosis not present

## 2020-06-08 DIAGNOSIS — J449 Chronic obstructive pulmonary disease, unspecified: Secondary | ICD-10-CM | POA: Diagnosis not present

## 2020-06-08 DIAGNOSIS — F32A Depression, unspecified: Secondary | ICD-10-CM | POA: Diagnosis not present

## 2020-06-08 DIAGNOSIS — N183 Chronic kidney disease, stage 3 unspecified: Secondary | ICD-10-CM | POA: Diagnosis not present

## 2020-06-08 DIAGNOSIS — G4700 Insomnia, unspecified: Secondary | ICD-10-CM | POA: Diagnosis not present

## 2020-06-11 ENCOUNTER — Other Ambulatory Visit: Payer: Self-pay

## 2020-06-11 ENCOUNTER — Other Ambulatory Visit (HOSPITAL_COMMUNITY): Payer: Self-pay

## 2020-06-11 DIAGNOSIS — G932 Benign intracranial hypertension: Secondary | ICD-10-CM | POA: Insufficient documentation

## 2020-06-11 DIAGNOSIS — J309 Allergic rhinitis, unspecified: Secondary | ICD-10-CM | POA: Insufficient documentation

## 2020-06-11 DIAGNOSIS — K8020 Calculus of gallbladder without cholecystitis without obstruction: Secondary | ICD-10-CM | POA: Insufficient documentation

## 2020-06-11 DIAGNOSIS — R159 Full incontinence of feces: Secondary | ICD-10-CM | POA: Insufficient documentation

## 2020-06-11 DIAGNOSIS — R296 Repeated falls: Secondary | ICD-10-CM | POA: Insufficient documentation

## 2020-06-11 DIAGNOSIS — R198 Other specified symptoms and signs involving the digestive system and abdomen: Secondary | ICD-10-CM | POA: Insufficient documentation

## 2020-06-11 DIAGNOSIS — K2940 Chronic atrophic gastritis without bleeding: Secondary | ICD-10-CM | POA: Insufficient documentation

## 2020-06-11 DIAGNOSIS — M4716 Other spondylosis with myelopathy, lumbar region: Secondary | ICD-10-CM | POA: Insufficient documentation

## 2020-06-11 DIAGNOSIS — G4700 Insomnia, unspecified: Secondary | ICD-10-CM | POA: Insufficient documentation

## 2020-06-11 DIAGNOSIS — Q359 Cleft palate, unspecified: Secondary | ICD-10-CM | POA: Insufficient documentation

## 2020-06-11 DIAGNOSIS — R197 Diarrhea, unspecified: Secondary | ICD-10-CM

## 2020-06-11 DIAGNOSIS — D7589 Other specified diseases of blood and blood-forming organs: Secondary | ICD-10-CM | POA: Insufficient documentation

## 2020-06-11 DIAGNOSIS — Z860100 Personal history of colon polyps, unspecified: Secondary | ICD-10-CM | POA: Insufficient documentation

## 2020-06-11 DIAGNOSIS — R269 Unspecified abnormalities of gait and mobility: Secondary | ICD-10-CM | POA: Insufficient documentation

## 2020-06-11 DIAGNOSIS — M109 Gout, unspecified: Secondary | ICD-10-CM | POA: Insufficient documentation

## 2020-06-11 DIAGNOSIS — Z6841 Body Mass Index (BMI) 40.0 and over, adult: Secondary | ICD-10-CM

## 2020-06-11 DIAGNOSIS — R627 Adult failure to thrive: Secondary | ICD-10-CM | POA: Insufficient documentation

## 2020-06-11 DIAGNOSIS — Z800 Family history of malignant neoplasm of digestive organs: Secondary | ICD-10-CM | POA: Insufficient documentation

## 2020-06-11 DIAGNOSIS — Z9181 History of falling: Secondary | ICD-10-CM

## 2020-06-11 DIAGNOSIS — Z8619 Personal history of other infectious and parasitic diseases: Secondary | ICD-10-CM | POA: Insufficient documentation

## 2020-06-11 DIAGNOSIS — J45909 Unspecified asthma, uncomplicated: Secondary | ICD-10-CM

## 2020-06-11 DIAGNOSIS — R400 Somnolence: Secondary | ICD-10-CM

## 2020-06-11 DIAGNOSIS — J329 Chronic sinusitis, unspecified: Secondary | ICD-10-CM

## 2020-06-11 DIAGNOSIS — F330 Major depressive disorder, recurrent, mild: Secondary | ICD-10-CM | POA: Insufficient documentation

## 2020-06-11 DIAGNOSIS — R609 Edema, unspecified: Secondary | ICD-10-CM

## 2020-06-11 DIAGNOSIS — F418 Other specified anxiety disorders: Secondary | ICD-10-CM | POA: Insufficient documentation

## 2020-06-11 DIAGNOSIS — M7989 Other specified soft tissue disorders: Secondary | ICD-10-CM

## 2020-06-11 DIAGNOSIS — Z853 Personal history of malignant neoplasm of breast: Secondary | ICD-10-CM | POA: Insufficient documentation

## 2020-06-11 DIAGNOSIS — F419 Anxiety disorder, unspecified: Secondary | ICD-10-CM | POA: Insufficient documentation

## 2020-06-11 HISTORY — DX: Diarrhea, unspecified: R19.7

## 2020-06-11 HISTORY — DX: Gout, unspecified: M10.9

## 2020-06-11 HISTORY — DX: Unspecified abnormalities of gait and mobility: R26.9

## 2020-06-11 HISTORY — DX: Cleft palate, unspecified: Q35.9

## 2020-06-11 HISTORY — DX: Allergic rhinitis, unspecified: J30.9

## 2020-06-11 HISTORY — DX: Benign intracranial hypertension: G93.2

## 2020-06-11 HISTORY — DX: Insomnia, unspecified: G47.00

## 2020-06-11 HISTORY — DX: Chronic atrophic gastritis without bleeding: K29.40

## 2020-06-11 HISTORY — DX: Chronic sinusitis, unspecified: J32.9

## 2020-06-11 HISTORY — DX: History of falling: Z91.81

## 2020-06-11 HISTORY — DX: Other spondylosis with myelopathy, lumbar region: M47.16

## 2020-06-11 HISTORY — DX: Personal history of colon polyps, unspecified: Z86.0100

## 2020-06-11 HISTORY — DX: Edema, unspecified: R60.9

## 2020-06-11 HISTORY — DX: Somnolence: R40.0

## 2020-06-11 HISTORY — DX: Unspecified asthma, uncomplicated: J45.909

## 2020-06-11 HISTORY — DX: Other specified diseases of blood and blood-forming organs: D75.89

## 2020-06-11 HISTORY — DX: Full incontinence of feces: R15.9

## 2020-06-11 HISTORY — DX: Calculus of gallbladder without cholecystitis without obstruction: K80.20

## 2020-06-11 HISTORY — DX: Body Mass Index (BMI) 40.0 and over, adult: Z684

## 2020-06-11 NOTE — Progress Notes (Signed)
Paramedicine Encounter    Patient ID: Rhonda [name], female    DOB: 04/09/1951, 70 y.o.   MRN: [id number]   Patient Care Team: Maurice Small, MD as PCP - General (Family Medicine) [name], LCSW as Social Worker (Licensed Clinical Social Worker) [name], [name], DO as Consulting Physician (Neurology)  Patient Active Problem List   Diagnosis Date Noted  . Abnormal gait 06/11/2020  . Adult failure to thrive syndrome 06/11/2020  . Allergic rhinitis 06/11/2020  . Anxiety 06/11/2020  . Asthma 06/11/2020  . Benign intracranial hypertension 06/11/2020  . Body mass index (BMI) 45.0-49.9, adult ([place]) 06/11/2020  . Bowel incontinence 06/11/2020  . Cholelithiasis 06/11/2020  . Chronic sinusitis 06/11/2020  . 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  . Daytime somnolence 06/11/2020  . Diarrhea of presumed infectious origin 06/11/2020  . Edema 06/11/2020  . Family history of malignant neoplasm of gastrointestinal tract 06/11/2020  . Gout 06/11/2020  . History of fall 06/11/2020  . History of infectious disease 06/11/2020  . Insomnia 06/11/2020  . Irregular bowel habits 06/11/2020  . 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 Lumbar spondylosis with myelopathy 06/11/2020  . Macrocytosis 06/11/2020  . Mild recurrent major depression ([place]) 06/11/2020  . Personal history of colonic polyps 06/11/2020  . Personal history of malignant neoplasm of breast 06/11/2020  . Recurrent falls 06/11/2020  . Mixed anxiety and depressive disorder 06/11/2020  . Irritable bowel syndrome 01/04/2020  . Spinal stenosis of lumbar region 01/03/2020  . Diabetes mellitus with neuropathy ([place]) 06/15/2019  . Hypokalemia 11/08/2018  . CKD (chronic kidney disease) stage 3, GFR 30-59 ml/min (HCC) 11/08/2018  . Orthostatic hypotension 07/28/2017  . SVD (spontaneous vaginal delivery)   . Peripheral neuropathy   . On home oxygen therapy   . Migraines   . Left bundle branch block   . Hypothyroidism   . Hypertension   . Hyperlipidemia    . Heart murmur   . GERD (gastroesophageal reflux disease)   . Exertional shortness of breath   . Major depressive disorder   . Back pain   . Arthritis   . Generalized anxiety disorder   . Anemia   . Chronic respiratory failure ([place]) 09/14/2013  . 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 Morbid obesity ([place]) 11/01/2012  . Chronic systolic heart failure ([place]) 10/27/2012  . Endometrial polyp 01/20/2012  . Malignant tumor of breast ([place]) 03/26/2011  . Unspecified vitamin D deficiency 03/26/2011    Current Outpatient Medications:  .  acetaZOLAMIDE (DIAMOX) 125 MG tablet, TAKE 1 TABLET(125 MG) BY MOUTH DAILY, Disp: 90 tablet, Rfl: 3 .  carvedilol (COREG) 3.125 MG tablet, TAKE 1 TABLET(3.125 MG) BY MOUTH TWICE DAILY WITH A MEAL, Disp: 180 tablet, Rfl: 3 .  citalopram (CELEXA) 20 MG tablet, Take 20 mg by mouth daily., Disp: , Rfl:  .  dicyclomine (BENTYL) 20 MG tablet, Take 20 mg by mouth every 6 (six) hours., Disp: , Rfl:  .  DULoxetine (CYMBALTA) 30 MG capsule, Take 30 mg by mouth daily. Take once in the morning., Disp: , Rfl:  .  fexofenadine (ALLEGRA) 180 MG tablet, Take 180 mg by mouth 2 (two) times daily. , Disp: , Rfl:  .  fludrocortisone (FLORINEF) 0.1 MG tablet, TAKE 1 TABLET(0.1 MG) BY MOUTH TWICE DAILY, Disp: 60 tablet, Rfl: 3 .  gabapentin (NEURONTIN) 300 MG capsule, Take 300 mg by mouth 3 (three) times daily. , Disp: , Rfl:  .  gabapentin (NEURONTIN) 800 MG tablet, Take 1 tablet (800 mg total)  by mouth at bedtime as needed (neuropathy)., Disp: 90 tablet, Rfl: 1 .  Ginkgo Biloba 120 MG TABS, Take by mouth., Disp: , Rfl:  .  hydrOXYzine (ATARAX/VISTARIL) 25 MG tablet, Take 25 mg by mouth 2 (two) times daily. Takes Morning and Bedtime, Disp: , Rfl:  .  hydrOXYzine (VISTARIL) 25 MG capsule, Take 25 mg by mouth 3 (three) times daily as needed. , Disp: , Rfl:  .  ipratropium (ATROVENT) 0.03 % nasal spray, Place 2 sprays into both nostrils 2  (two) times daily., Disp: , Rfl:  .  levothyroxine (SYNTHROID) 88 MCG tablet, TAKE 1 TABLET(88 MCG) BY MOUTH DAILY BEFORE BREAKFAST, Disp: 90 tablet, Rfl: 1 .  metolazone (ZAROXOLYN) 2.5 MG tablet, Take 1 tablet by mouth every Tuesday, Disp: 12 tablet, Rfl: 3 .  midodrine (PROAMATINE) 10 MG tablet, Take 1 tablet (10 mg total) by mouth 3 (three) times daily with meals., Disp: 270 tablet, Rfl: 3 .  mirtazapine (REMERON) 30 MG tablet, TAKE 1 TABLET BY MOUTH EVERY NIGHT AT BEDTIME, Disp: 30 tablet, Rfl: 3 .  montelukast (SINGULAIR) 10 MG tablet, Take 10 mg by mouth at bedtime. , Disp: , Rfl: 1 .  omeprazole (PRILOSEC) 40 MG capsule, Take 40 mg by mouth daily., Disp: , Rfl:  .  potassium chloride SA (KLOR-CON) 20 MEQ tablet, TAKE 2 TABLETS THREE TIMES DAILY. TAKE AN EXTRA 2 TABLETS ON DAYS YOU TAKE METOLAZONE, Disp: 572 tablet, Rfl: 0 .  probenecid (BENEMID) 500 MG tablet, Take 500 mg by mouth 2 (two) times daily., Disp: , Rfl:  .  simvastatin (ZOCOR) 10 MG tablet, Take 10 mg by mouth every evening., Disp: , Rfl:  .  spironolactone (ALDACTONE) 25 MG tablet, Take 1 tablet (25 mg total) by mouth every evening., Disp: 90 tablet, Rfl: 3 .  torsemide (DEMADEX) 20 MG tablet, Take 3 tablets (60 mg total) by mouth 2 (two) times daily., Disp: 540 tablet, Rfl: 3 .  traMADol (ULTRAM) 50 MG tablet, Take by mouth every 6 (six) hours as needed., Disp: , Rfl:  Allergies  Allergen Reactions  . Ceftin Anaphylaxis    Face and throat swell   . Geodon [Ziprasidone Hcl] Hives  . Lisinopril Other (See Comments)    angioedema  . Shellfish Allergy Other (See Comments)    Gout exacerbation  . Allopurinol Nausea Only and Other (See Comments)    weakness  . Ativan [Lorazepam] Itching  . Sulfa Antibiotics Itching  . Ultram [Tramadol Hcl] Itching  . Valium [Diazepam] Other (See Comments)    Patient states that diazepam doesn't relax, it has the opposite effect.     Social History   Socioeconomic History  . Marital  status: Married    Spouse name: Not on file  . Number of children: 2  . Years of education: 82  . Highest education level: Master's degree (e.g., MA, MS, MEng, MEd, MSW, MBA)  Occupational History  . Occupation: retired  Tobacco Use  . Smoking status: Current Some Day Smoker    Packs/day: 0.10    Years: 26.00    Pack years: 2.60    Types: Cigarettes    Last attempt to quit: 05/06/2007    Years since quitting: 13.1  . Smokeless tobacco: Never Used  Vaping Use  . Vaping Use: Never used  Substance and Sexual Activity  . Alcohol use: No  . Drug use: No  . Sexual activity: Yes  Other Topics Concern  . 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  Constitutional:      Appearance: She is normal weight.  HENT:     Head: Normocephalic.     Nose: Nose normal.     Mouth/Throat:     Mouth: Mucous membranes are dry.     Pharynx: Oropharynx is clear.  Eyes:     Pupils: Pupils are equal, round, and reactive to light.  Cardiovascular:     Rate and Rhythm: Normal rate and regular rhythm.     Pulses: Normal pulses.     Heart sounds: Normal heart sounds.  Pulmonary:     Effort: Pulmonary effort is normal.     Breath sounds: Normal breath sounds.  Abdominal:     General: Abdomen is flat.     Palpations: Abdomen is soft.  Musculoskeletal:        General: Normal range of motion.      Cervical back: Normal range of motion.     Right lower leg: No edema.     Left lower leg: No edema.  Skin:    General: Skin is warm and dry.     Capillary Refill: Capillary refill takes less than 2 seconds.  Neurological:     General: No focal deficit present.     Mental Status: She is alert. Mental status is at baseline.  Psychiatric:        Mood and Affect: Mood normal.     Arrived for home visit for Rhonda [name] who was alert and oriented walking about in her home. Rhonda [name] reports feeling okay today but having bouts of anxiety and confusion over the last week which is not abnormal for her. Rhonda [name] was compliant with most medications aside from 2 doses. I gave Rhonda [name] her afternoon medications during visit. Vitals were obtained. Lower legs looked good with no increased swelling. Rhonda [name] and I reviewed medications and confirmed same. I filled two pill boxes for her with impending winter weather next week. Rhonda [name] agreed. I reviewed upcoming appointments with her tomorrow and confirmed same with each office and wrote down times and locations. Rhonda [name] reports her husband Ed will take her to these tomorrow. Rhonda [name] also reports she was able to get an appointment with a new dentist and has same scheduled for end of the month. I will plan to see Rhonda [name] in one week. Home visit complete.   Refills: NONE    Future Appointments  Date Time Provider [place]  06/12/2020 10:00 AM Philemon Kingdom, MD [facility] None  06/12/2020  1:00 PM [facility]  06/12/2020  2:20 PM Elam Dutch, MD [facility]  07/01/2020  7:50 AM [facility] DEVICE REMOTES [facility]  07/11/2020  7:40 AM [facility] DEVICE REMOTES [facility]  07/25/2020  2:00 PM Evans Lance, MD  [facility]  10/10/2020  7:40 AM [facility] DEVICE REMOTES [facility]  01/09/2021  7:40 AM [facility] DEVICE REMOTES [facility]  02/27/2021  9:50 AM  [name], Donika K, DO [facility] None  04/10/2021  7:40 AM [facility] DEVICE REMOTES [facility]  07/10/2021  7:40 AM [facility] DEVICE REMOTES [facility]  08/11/2021  7:40 AM [facility] DEVICE REMOTES [facility]  09/10/2021  7:40 AM [facility] DEVICE REMOTES [facility]     ACTION: Home visit completed Next visit planned for one week

## 2020-06-11 NOTE — Progress Notes (Signed)
Patient ID: FRONNIE URTON, female   DOB: 26-Feb-1951, 70 y.o.   MRN: [id number]   This visit occurred during the SARS-CoV-2 public health emergency.  Safety protocols were in place, including screening questions prior to the visit, additional usage of staff PPE, and extensive cleaning of exam room while observing appropriate contact time as indicated for disinfecting solutions.   HPI  Rhonda [name] is a 70 y.o.-year-old female, initially referred by her neurologist, Dr. [name], presenting for follow-up for hypothyroidism. Last visit 1 year ago.  Reviewed and addended history: Pt. has been dx with hypothyroidism in ~2009 -started on levothyroxine.  At previous visit, she was not taking levothyroxine correctly (at night, after dinner, occasionally skipping doses, + colestipol and multivitamins) and we discussed about how to take them correctly.  However, she is usually not returning for labs after adjustment in levothyroxine dose.   I sent her a message through [facility] after the latest results return in 06/2019 but she did not read it...  Pt is on levothyroxine 88 mcg daily, taken: - in am - fasting - at least 30 min from b'fast - no calcium - no iron - + multivitamins in p.m. - + PPIs later in the day (she believes) -her pills are managed by an EMS who comes to her house - not on Biotin She started taking colestipol 04/2018 and she was taking this in the morning.  However, she came off in 2020.  Reviewed her TFTs: Lab Results  Component Value Date   TSH 0.03 (L) 06/29/2019   TSH 0.21 (L) 02/22/2019   TSH 0.26 (L) 12/21/2018    Previously: Lab Results  Component Value Date   TSH 3.69 08/05/2009   TSH 0.372  11/02/2008   TSH 0.781 09/16/2008   FREET4 1.25 12/09/2017   T3FREE 2.9 12/09/2017   Antithyroid antibodies: No results found for: THGAB No components found for: TPOAB  Pt denies: - feeling nodules in neck - hoarseness - dysphagia - choking - SOB with lying down  No  FH of thyroid cancer. No h/o radiation tx to head or neck.  No herbal supplements. No Biotin use. No recent steroids use.   She has secondary hyperparathyroidism, likely due to CKD: Lab Results  Component Value Date   PTH 31 02/10/2018   PTH 109 (H) 12/08/2017   CALCIUM 9.5 04/09/2020   CALCIUM 9.5 03/27/2020   CALCIUM 9.2 03/14/2020   CALCIUM 8.9 11/29/2019   CALCIUM 9.1 11/22/2019   CALCIUM 8.9 11/08/2019   CALCIUM 8.8 (L) 06/07/2019   CALCIUM 9.0 03/30/2019   CALCIUM 9.2 01/11/2019   CALCIUM 9.0 11/10/2018   No history of vitamin D deficiency: Lab Results  Component Value Date   VD25OH 39.48 02/10/2018   VD25OH 49 07/16/2011  She is on a multivitamin.  + CKD: Lab Results  Component Value Date   BUN 40 (H) 04/09/2020   Lab Results  Component Value Date   CREATININE 1.44 (H) 04/09/2020   No history of kidney stones or osteoporosis.  She has a history of breast cancer.  ROS: Constitutional: no weight gain/no weight loss, no fatigue, no subjective hyperthermia, no subjective hypothermia Eyes: no blurry vision, no xerophthalmia ENT: no sore throat, + see HPI Cardiovascular: no CP/no SOB/+ palpitations/+ leg swelling Respiratory: no cough/no SOB/no wheezing Gastrointestinal: no N/no V/no D/no C/no acid reflux Musculoskeletal: no muscle aches/no joint aches Skin: no rashes, no hair loss Neurological: + tremors/no numbness/no tingling/no dizziness + depression  I reviewed pt's medications,  allergies, PMH, social hx, family hx, and changes were documented in the history of present illness. Otherwise, unchanged from my initial visit note.  Past Medical History:  Diagnosis Date  . Anemia   . Arthritis    Right knee  . Asthma   . Back pain    Disk problem  . Breast cancer, stage 1 ([place]) 03/26/2011   Left; completed chemotherapy and radiation treatments  . Cardiomyopathy   . Chronic respiratory failure ([place]) 09/14/2013  . Chronic systolic heart failure ([facility])     a) NICM b) ECHO (03/2013) EF 20-25% c) ECHO (09/2013) EF 45-50%, grade I DD  . CKD (chronic kidney disease) stage 3, GFR 30-59 ml/min ([place]) 11/08/2018  . Complication of anesthesia    History of low blood pressure after surgery; attributed to lying flat  . Diabetes mellitus    "diet controlled" (05/03/2013)  . Exertional shortness of breath   . Generalized anxiety disorder   . GERD (gastroesophageal reflux disease)   . Gout   . Heart murmur   . Hyperlipidemia   . Hypertension   . Hypokalemia 11/08/2018  . Hypothyroidism   . Left bundle branch block    s/p CRT-D (04/2013)  . Major depressive disorder   . Migraines   . On home oxygen therapy    "2L suppose to be q night" (05/03/2013)  . Orthostatic hypotension 07/28/2017  . Peripheral neuropathy    Feet  . SVD (spontaneous vaginal delivery)    x 2  . Unspecified vitamin D deficiency 03/26/2011   Does not take meds   Past Surgical History:  Procedure Laterality Date  . BI-VENTRICULAR IMPLANTABLE CARDIOVERTER DEFIBRILLATOR N/A 05/03/2013   Procedure: BI-VENTRICULAR IMPLANTABLE CARDIOVERTER DEFIBRILLATOR  (CRT-D);  Surgeon: Evans Lance, MD;  Location: [facility] CATH LAB;  Service: Cardiovascular;  Laterality: N/A;  . BI-VENTRICULAR IMPLANTABLE CARDIOVERTER DEFIBRILLATOR  (CRT-D)  05/03/2013   MDT CRTD implanted by Dr Lovena Le for non ischemic cardiomyopathy  . BIOPSY  05/31/2018   Procedure: BIOPSY;  Surgeon: Ronnette Juniper, MD;  Location: [facility];  Service: Gastroenterology;;  . BIV ICD GENERATOR CHANGEOUT N/A 04/11/2020   Procedure: BIV ICD GENERATOR CHANGEOUT;  Surgeon: Evans Lance, MD;  Location: [facility] CV LAB;  Service: Cardiovascular;  Laterality: N/A;  . BREAST LUMPECTOMY Left 07/28/2010  . CARDIAC CATHETERIZATION  2010   NORMAL CORONARY ARTERIES  . 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--11 SURGERIES     PT HAS REMOVABLE SPEECH BULB-TAKES IT OUT BEFORE HER SURGERY  . COLONOSCOPY    . COLONOSCOPY N/A 05/31/2018   Procedure:  COLONOSCOPY;  Surgeon: Ronnette Juniper, MD;  Location: [facility] ENDOSCOPY;  Service: Gastroenterology;  Laterality: N/A;  . HYSTEROSCOPY WITH D & C  01/20/2012   Procedure: DILATATION AND CURETTAGE /HYSTEROSCOPY;  Surgeon: Margarette Asal, MD;  Location: [name];  Service: Gynecology;  Laterality: N/A;  with trueclear  . PORT-A-CATH REMOVAL  09/23/2011   Procedure: REMOVAL PORT-A-CATH;  Surgeon: Rolm Bookbinder, MD;  Location: [facility];  Service: General;  Laterality: N/A;  Port Removal  . PORTACATH PLACEMENT  2012  . TONSILLECTOMY  1960's  . TOTAL KNEE ARTHROPLASTY  05/10/2012   Procedure: TOTAL KNEE ARTHROPLASTY;  Surgeon: Mauri Pole, MD;  Location: [facility];  Service: Orthopedics;  Laterality: Right;  . WISDOM TOOTH EXTRACTION     Social History   Socioeconomic History  . Marital status: Married    Spouse name: Not on file  . Number of children: 2  . Years  of education: 39  . Highest education level: Master's degree (e.g., MA, MS, MEng, MEd, MSW, MBA)  Occupational History  . Occupation: retired  Tobacco Use  . Smoking status: Current Some Day Smoker    Packs/day: 0.10    Years: 26.00    Pack years: 2.60    Types: Cigarettes    Last attempt to quit: 05/06/2007    Years since quitting: 13.1  . Smokeless tobacco: Never Used  Vaping Use  . Vaping Use: Never used  Substance and Sexual Activity  . Alcohol use: No  . Drug use: No  . Sexual activity: Yes  Other Topics Concern  . Not on file  Social History Narrative   Tobacco Use Cigarettes: Former Smoker, Quit in 2008   No Alcohol   No recreational drug use   Diet: Regular/Low Carb   Exercise: None   Occupation: disabled   Education: Research officer, political party, masters   Children: 2   Firearms: No   Therapist, art Use: 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      Social Determinants of Health   Financial Resource Strain: Not on file  Food Insecurity: Not on file  Transportation Needs: Not on file  Physical Activity:  Not on file  Stress: Not on file  Social Connections: Not on file  Intimate Partner Violence: Not on file   Current Outpatient Medications on File Prior to Visit  Medication Sig Dispense Refill  . acetaZOLAMIDE (DIAMOX) 125 MG tablet TAKE 1 TABLET(125 MG) BY MOUTH DAILY 90 tablet 3  . carvedilol (COREG) 3.125 MG tablet TAKE 1 TABLET(3.125 MG) BY MOUTH TWICE DAILY WITH A MEAL 180 tablet 3  . citalopram (CELEXA) 20 MG tablet Take 20 mg by mouth daily.    Marland Kitchen dicyclomine (BENTYL) 20 MG tablet Take 20 mg by mouth every 6 (six) hours.    . DULoxetine (CYMBALTA) 30 MG capsule Take 30 mg by mouth daily. Take once in the morning.    . fexofenadine (ALLEGRA) 180 MG tablet Take 180 mg by mouth 2 (two) times daily.     . fludrocortisone (FLORINEF) 0.1 MG tablet TAKE 1 TABLET(0.1 MG) BY MOUTH TWICE DAILY 60 tablet 3  . gabapentin (NEURONTIN) 300 MG capsule Take 300 mg by mouth 3 (three) times daily.     Marland Kitchen gabapentin (NEURONTIN) 800 MG tablet Take 1 tablet (800 mg total) by mouth at bedtime as needed (neuropathy). 90 tablet 1  . Ginkgo Biloba 120 MG TABS Take by mouth.    . hydrOXYzine (ATARAX/VISTARIL) 25 MG tablet Take 25 mg by mouth 2 (two) times daily. Takes Morning and Bedtime    . hydrOXYzine (VISTARIL) 25 MG capsule Take 25 mg by mouth 3 (three) times daily as needed.     Marland Kitchen ipratropium (ATROVENT) 0.03 % nasal spray Place 2 sprays into both nostrils 2 (two) times daily.    Marland Kitchen levothyroxine (SYNTHROID) 88 MCG tablet TAKE 1 TABLET(88 MCG) BY MOUTH DAILY BEFORE BREAKFAST 90 tablet 1  . metolazone (ZAROXOLYN) 2.5 MG tablet Take 1 tablet by mouth every Tuesday 12 tablet 3  . midodrine (PROAMATINE) 10 MG tablet Take 1 tablet (10 mg total) by mouth 3 (three) times daily with meals. 270 tablet 3  . mirtazapine (REMERON) 30 MG tablet TAKE 1 TABLET BY MOUTH EVERY NIGHT AT BEDTIME 30 tablet 3  . montelukast (SINGULAIR) 10 MG tablet Take 10 mg by mouth at bedtime.   1  . omeprazole (  PRILOSEC) 40 MG capsule Take  40 mg by mouth daily.    . potassium chloride SA (KLOR-CON) 20 MEQ tablet TAKE 2 TABLETS THREE TIMES DAILY. TAKE AN EXTRA 2 TABLETS ON DAYS YOU TAKE METOLAZONE 572 tablet 0  . probenecid (BENEMID) 500 MG tablet Take 500 mg by mouth 2 (two) times daily.    . simvastatin (ZOCOR) 10 MG tablet Take 10 mg by mouth every evening.    Marland Kitchen spironolactone (ALDACTONE) 25 MG tablet Take 1 tablet (25 mg total) by mouth every evening. 90 tablet 3  . torsemide (DEMADEX) 20 MG tablet Take 3 tablets (60 mg total) by mouth 2 (two) times daily. 540 tablet 3  . traMADol (ULTRAM) 50 MG tablet Take by mouth every 6 (six) hours as needed.     No current facility-administered medications on file prior to visit.   Allergies  Allergen Reactions  . Ceftin Anaphylaxis    Face and throat swell   . Geodon [Ziprasidone Hcl] Hives  . Lisinopril Other (See Comments)    angioedema  . Shellfish Allergy Other (See Comments)    Gout exacerbation  . Allopurinol Nausea Only and Other (See Comments)    weakness  . Ativan [Lorazepam] Itching  . Sulfa Antibiotics Itching  . Ultram [Tramadol Hcl] Itching  . Valium [Diazepam] Other (See Comments)    Patient states that diazepam doesn't relax, it has the opposite effect.   Family History  Problem Relation Age of Onset  . Alzheimer's disease Mother   . CVA Mother   . Hypertension Mother   . Cancer - Colon Father        late 66  . Birth defects Paternal Uncle     PE: BP 128/80   Pulse 91   Ht 5\' 1"  (1.549 m)   Wt 187 lb 12.8 oz (85.2 kg)   SpO2 96%   BMI 35.48 kg/m  Wt Readings from Last 3 Encounters:  06/12/20 187 lb 12.8 oz (85.2 kg)  06/11/20 204 lb (92.5 kg)  06/04/20 208 lb (94.3 kg)   Constitutional: overweight, in NAD Eyes: PERRLA, EOMI, no exophthalmos ENT: moist mucous membranes, no thyromegaly, no cervical lymphadenopathy Cardiovascular: RRR, No MRG, + B LE edema Respiratory: CTA B Gastrointestinal: abdomen soft, NT, ND, BS+ Musculoskeletal: no  deformities, strength intact in all 4 Skin: moist, warm, no rashes Neurological: + Very fine tremor with outstretched hands, DTR normal in all 4  ASSESSMENT: 1. Hypothyroidism  2. Eucalcemic hyperparathyroidism  PLAN:  1. Patient with longstanding hypothyroidism, on levothyroxine therapy.  In the past, she was not taking levothyroxine correctly, but she is doing so now.  She was on colestipol, but she came off the resin in 2020. -Latest thyroid tests were reviewed with the patient: TSH was low a year ago (see below).  She again did not return for labs afterwards... She mentions that she did not have access to MyChart and did not see my message.  She would not want to be contacted through MyChart anymore as she cannot check her messages. Lab Results  Component Value Date   TSH 0.03 (L) 06/29/2019   - she continues levothyroxine 88 mcg daily - we discussed about taking the thyroid hormone every day, with water, >30 minutes before breakfast, separated by >4 hours from acid reflux medications, calcium, iron, multivitamins. Pt. is taking it correctly, but she will check at home whether her omeprazole is taken later in the day. - will check thyroid tests today: TSH and fT4 -  If labs are abnormal, she will need to return for repeat TFTs in 1.5 months - I will see her back in 4 months, but with labs at 1.5 months  2. Eucalcemic hyperparathyroidism -We investigated her for hyperparathyroidism.  Vitamin D level was normal, calcium was low normal, and PTH was high.  She also has CKD and we discussed at previous visits about seeing a nephrologist as her hyperparathyroidism appears to be secondary to her CKD, and not the primary problem.  She did not see nephrology yet. -Latest calcium level was reviewed and this was normal: Lab Results  Component Value Date   CALCIUM 9.5 04/09/2020   PHOS 4.2 08/05/2009  -Latest PTH was in the low normal range  Needs refills.  Component     Latest Ref Rng &  Units 06/12/2020  TSH     0.35 - 4.50 uIU/mL 4.08  T4,Free(Direct)     0.60 - 1.60 ng/dL 0.39 (L)   TSH is at goal, free T4 slightly low (she probably missed the dose the morning of the appointment).  We can continue the same dose of levothyroxine.  Philemon Kingdom, MD PhD [facility] Endocrinology

## 2020-06-12 ENCOUNTER — Ambulatory Visit (HOSPITAL_COMMUNITY)
Admission: RE | Admit: 2020-06-12 | Discharge: 2020-06-12 | Disposition: A | Discharge status: Home / Self Care | Payer: Medicare HMO | Source: Ambulatory Visit | Attending: Vascular Surgery | Admitting: Vascular Surgery

## 2020-06-12 ENCOUNTER — Other Ambulatory Visit: Payer: Self-pay

## 2020-06-12 ENCOUNTER — Encounter: Payer: Self-pay | Admitting: Vascular Surgery

## 2020-06-12 ENCOUNTER — Ambulatory Visit: Payer: Medicare HMO | Admitting: Internal Medicine

## 2020-06-12 ENCOUNTER — Encounter: Payer: Self-pay | Admitting: Internal Medicine

## 2020-06-12 ENCOUNTER — Ambulatory Visit: Payer: Medicare HMO | Admitting: Vascular Surgery

## 2020-06-12 VITALS — BP 128/80 | HR 91 | Ht 61.0 in | Wt 187.8 lb

## 2020-06-12 VITALS — BP 134/83 | HR 86 | Temp 97.0°F | Resp 18 | Ht 60.0 in | Wt 204.5 lb

## 2020-06-12 DIAGNOSIS — I83813 Varicose veins of bilateral lower extremities with pain: Secondary | ICD-10-CM

## 2020-06-12 DIAGNOSIS — E039 Hypothyroidism, unspecified: Secondary | ICD-10-CM | POA: Diagnosis not present

## 2020-06-12 DIAGNOSIS — M7989 Other specified soft tissue disorders: Secondary | ICD-10-CM | POA: Diagnosis not present

## 2020-06-12 DIAGNOSIS — E213 Hyperparathyroidism, unspecified: Secondary | ICD-10-CM | POA: Diagnosis not present

## 2020-06-12 LAB — TSH: TSH: 4.08 u[IU]/mL (ref 0.35–4.50)

## 2020-06-12 LAB — T4, FREE: Free T4: 0.39 ng/dL — ABNORMAL LOW (ref 0.60–1.60)

## 2020-06-12 NOTE — Patient Instructions (Signed)
Please continue levothyroxine 88 mcg daily.  Take the thyroid hormone every day, with water, at least 30 minutes before breakfast, separated by at least 4 hours from: - acid reflux medications - calcium - iron - multivitamins  Please stop at the lab.  Please return to see me in 4 months.

## 2020-06-12 NOTE — Progress Notes (Signed)
Referring Physician: Dr. Justin Mend  Patient name: Rhonda [name] MRN: [id number] DOB: 11-Jan-1951 Sex: female  REASON FOR CONSULT: Symptomatic varicose veins with pain and swelling  HPI: Rhonda [name] is a 70 y.o. female, with a longstanding history of lower extremity leg edema aches and pains.  Patient states most of her pain is in the posterior thigh and lateral thigh.  She has struggled with her weight for a long time and has been counseled regarding weight loss by several additional physicians.  We did talk about this briefly today.  She has never had any skin ulcerations.  She has noticed some bulging veins in her lower extremities bilaterally.  She has tried to wear some type of compression stockings in the thigh and pantyhose level but sometimes these fall down and are irritating to wear.  Of note she also has a history of congestive heart failure and recently had an AICD battery change.  I did note that her most recent echocardiogram had essentially normal ejection fraction.  This was in January of this year.  She also has a history of chronic back pain and apparently is being considered for a back operation by Dr. Marcello Moores.  This has been deferred for 3 months until she recovers from her AICD battery change.  She states both legs are equally affected.   Past Medical History:  Diagnosis Date  . Anemia   . Arthritis    Right knee  . Asthma   . Back pain    Disk problem  . Breast cancer, stage 1 ([place]) 03/26/2011   Left; completed chemotherapy and radiation treatments  . Cardiomyopathy   . Chronic respiratory failure ([place]) 09/14/2013  . Chronic systolic heart failure (H[facility])    a) NICM b) ECHO (03/2013) EF 20-25% c) ECHO (09/2013) EF 45-50%, grade I DD  . CKD (chronic kidney disease) stage 3, GFR 30-59 ml/min ([place]) 11/08/2018  . Complication of anesthesia    History of low blood pressure after surgery; attributed to lying flat  . Diabetes mellitus    "diet controlled" (05/03/2013)  .  Exertional shortness of breath   . Generalized anxiety disorder   . GERD (gastroesophageal reflux disease)   . Gout   . Heart murmur   . Hyperlipidemia   . Hypertension   . Hypokalemia 11/08/2018  . Hypothyroidism   . Left bundle branch block    s/p CRT-D (04/2013)  . Major depressive disorder   . Migraines   . On home oxygen therapy    "2L suppose to be q night" (05/03/2013)  . Orthostatic hypotension 07/28/2017  . Peripheral neuropathy    Feet  . SVD (spontaneous vaginal delivery)    x 2  . Unspecified vitamin D deficiency 03/26/2011   Does not take meds   Past Surgical History:  Procedure Laterality Date  . BI-VENTRICULAR IMPLANTABLE CARDIOVERTER DEFIBRILLATOR N/A 05/03/2013   Procedure: BI-VENTRICULAR IMPLANTABLE CARDIOVERTER DEFIBRILLATOR  (CRT-D);  Surgeon: Evans Lance, MD;  Location[facility] CATH LAB;  Service: Cardiovascular;  Laterality: N/A;  . BI-VENTRICULAR IMPLANTABLE CARDIOVERTER DEFIBRILLATOR  (CRT-D)  05/03/2013   MDT CRTD implanted by Dr Lovena Le for non ischemic cardiomyopathy  . BIOPSY  05/31/2018   Procedure: BIOPSY;  Surgeon: Ronnette Juniper, MD;  Location: [facility];  Service: Gastroenterology;;  . BIV ICD GENERATOR CHANGEOUT N/A 04/11/2020   Procedure: BIV ICD GENERATOR CHANGEOUT;  Surgeon: Evans Lance, MD;  Location: [facility] CV LAB;  Service: Cardiovascular;  Laterality: N/A;  . BREAST  LUMPECTOMY Left 07/28/2010  . CARDIAC CATHETERIZATION  2010   NORMAL CORONARY ARTERIES  . 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--11 SURGERIES     PT HAS REMOVABLE SPEECH BULB-TAKES IT OUT BEFORE HER SURGERY  . COLONOSCOPY    . COLONOSCOPY N/A 05/31/2018   Procedure: COLONOSCOPY;  Surgeon: Ronnette Juniper, MD;  Location: [facility] ENDOSCOPY;  Service: Gastroenterology;  Laterality: N/A;  . HYSTEROSCOPY WITH D & C  01/20/2012   Procedure: DILATATION AND CURETTAGE /HYSTEROSCOPY;  Surgeon: Margarette Asal, MD;  Location: [name] [facility];  Service: Gynecology;  Laterality: N/A;  with trueclear  .  PORT-A-CATH REMOVAL  09/23/2011   Procedure: REMOVAL PORT-A-CATH;  Surgeon: Rolm Bookbinder, MD;  Location: [facility] ORS;  Service: General;  Laterality: N/A;  Port Removal  . PORTACATH PLACEMENT  2012  . TONSILLECTOMY  1960's  . TOTAL KNEE ARTHROPLASTY  05/10/2012   Procedure: TOTAL KNEE ARTHROPLASTY;  Surgeon: Mauri Pole, MD;  Location: [facility] ORS;  Service: Orthopedics;  Laterality: Right;  . WISDOM TOOTH EXTRACTION      Family History  Problem Relation Age of Onset  . Alzheimer's disease Mother   . CVA Mother   . Hypertension Mother   . Cancer - Colon Father        late 26  . Birth defects Paternal Uncle     SOCIAL HISTORY: Social History   Socioeconomic History  . Marital status: Married    Spouse name: Not on file  . Number of children: 2  . Years of education: 35  . Highest education level: Master's degree (e.g., MA, MS, MEng, MEd, MSW, MBA)  Occupational History  . Occupation: retired  Tobacco Use  . Smoking status: Current Some Day Smoker    Packs/day: 0.10    Years: 26.00    Pack years: 2.60    Types: Cigarettes    Last attempt to quit: 05/06/2007    Years since quitting: 13.1  . Smokeless tobacco: Never Used  Vaping Use  . Vaping Use: Never used  Substance and Sexual Activity  . Alcohol use: No  . Drug use: No  . Sexual activity: Yes  Other Topics Concern  . 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 Ceftin Anaphylaxis    Face and throat swell   . Geodon [Ziprasidone Hcl] Hives  . Lisinopril Other (See Comments)    angioedema  . Shellfish Allergy Other (See Comments)    Gout exacerbation  . Allopurinol Nausea Only and Other (See Comments)    weakness  . Ativan [Lorazepam] Itching  . Sulfa Antibiotics Itching  . Ultram [Tramadol Hcl] Itching  . Valium [Diazepam] Other (See Comments)    Patient states that diazepam doesn't relax, it has the opposite effect.  Current Outpatient Medications  Medication Sig Dispense Refill  . acetaZOLAMIDE (DIAMOX) 125 MG tablet TAKE 1 TABLET(125 MG) BY MOUTH DAILY 90 tablet 3  . carvedilol (COREG) 3.125 MG tablet TAKE 1 TABLET(3.125 MG) BY MOUTH TWICE DAILY WITH A MEAL 180 tablet 3  . citalopram (CELEXA) 20 MG tablet Take 20 mg by mouth daily.    Marland Kitchen dicyclomine (BENTYL) 20 MG tablet Take 20 mg by mouth every 6 (six) hours.    . DULoxetine (CYMBALTA) 30 MG capsule Take 30 mg by mouth daily. Take once in the morning.    . fexofenadine (ALLEGRA) 180 MG tablet Take 180 mg by mouth 2 (two) times daily.    . fludrocortisone (FLORINEF) 0.1 MG tablet TAKE 1 TABLET(0.1 MG) BY MOUTH TWICE DAILY 60 tablet 3  . gabapentin (NEURONTIN) 300 MG capsule Take 300 mg by mouth 3 (three) times daily.    Marland Kitchen gabapentin (NEURONTIN) 800 MG tablet Take 1 tablet (800 mg total) by mouth at bedtime as needed (neuropathy). 90 tablet 1  . Ginkgo Biloba 120 MG TABS Take by mouth.    . hydrOXYzine (ATARAX/VISTARIL) 25 MG tablet Take 25 mg by mouth 2 (two) times daily. Takes Morning and Bedtime    . hydrOXYzine (VISTARIL) 25 MG capsule Take 25 mg by mouth 3 (three) times daily as needed.     Marland Kitchen ipratropium (ATROVENT) 0.03 % nasal spray Place 2 sprays into both nostrils 2 (two) times daily.    Marland Kitchen levothyroxine (SYNTHROID) 88 MCG tablet TAKE 1 TABLET(88 MCG) BY MOUTH DAILY BEFORE BREAKFAST 90 tablet 1  . metolazone (ZAROXOLYN) 2.5 MG  tablet Take 1 tablet by mouth every Tuesday 12 tablet 3  . midodrine (PROAMATINE) 10 MG tablet Take 1 tablet (10 mg total) by mouth 3 (three) times daily with meals. 270 tablet 3  . mirtazapine (REMERON) 30 MG tablet TAKE 1 TABLET BY MOUTH EVERY NIGHT AT BEDTIME 30 tablet 3  . montelukast (SINGULAIR) 10 MG tablet Take 10 mg by mouth at bedtime.   1  . omeprazole (PRILOSEC) 40 MG capsule Take 40 mg by mouth daily.    . potassium chloride SA (KLOR-CON) 20 MEQ tablet TAKE 2 TABLETS THREE TIMES DAILY. TAKE AN EXTRA 2 TABLETS ON DAYS YOU TAKE METOLAZONE (Patient taking differently: Take 20 mEq by mouth 3 (three) times daily. Take 3 tablets three times daily. Take an extra 2 tablets on days you take Metolazone) 572 tablet 0  . probenecid (BENEMID) 500 MG tablet Take 500 mg by mouth 2 (two) times daily.    . simvastatin (ZOCOR) 10 MG tablet Take 10 mg by mouth every evening.    Marland Kitchen spironolactone (ALDACTONE) 25 MG tablet Take 1 tablet (25 mg total) by mouth every evening. 90 tablet 3  . torsemide (DEMADEX) 20 MG tablet Take 3 tablets (60 mg total) by mouth 2 (two) times daily. 540 tablet 3  . traMADol (ULTRAM) 50 MG tablet Take by mouth every 6 (six) hours as needed.     No current facility-administered medications for this visit.    ROS:   General:  No weight loss, Fever, chills  HEENT: No recent headaches, no nasal bleeding, no visual changes, no sore throat  Neurologic: No dizziness, blackouts, seizures. No recent symptoms of stroke or mini- stroke. No recent episodes of slurred speech, or temporary blindness.  Cardiac: No recent episodes of chest pain/pressure, no shortness of breath at rest.  +shortness of breath with exertion.  Denies history of atrial fibrillation or irregular  heartbeat  Vascular: No history of rest pain in feet.  No history of claudication.  No history of non-healing ulcer, No history of DVT   Pulmonary: + home oxygen, no productive cough, no hemoptysis,  No asthma or  wheezing  Musculoskeletal:  [X]  Arthritis, [X]  Low back pain,  [X]  Joint pain  Hematologic:No history of hypercoagulable state.  No history of easy bleeding.  No history of anemia  Gastrointestinal: No hematochezia or melena,  No gastroesophageal reflux, no trouble swallowing  Urinary: [X]  chronic Kidney disease, [ ]  on HD - [ ]  MWF or [ ]  TTHS, [ ]  Burning with urination, [ ]  Frequent urination, [ ]  Difficulty urinating;   Skin: No rashes  Psychological: No history of anxiety,  No history of depression   Physical Examination  Vitals:   06/12/20 1354  BP: 134/83  Pulse: 86  Resp: 18  Temp: (!) 97 F (36.1 C)  TempSrc: Temporal  SpO2: 100%  Weight: 204 lb 8 oz (92.8 kg)  Height: 5' (1.524 m)    Body mass index is 39.94 kg/m.  General:  Alert and oriented, no acute distress HEENT: Normal Neck: No JVD Cardiac: Regular Rate and Rhythm Skin: No rash, no ulceration, scattered varicosities medial calf and extending to the ankle and foot bilaterally Extremity Pulses:  2+ radial, brachial, femoral, dorsalis pedis pulses bilaterally Musculoskeletal: No deformity trace pretibial edema bilaterally  Neurologic: Upper and lower extremity motor 5/5 and symmetric  DATA:  Patient had a venous duplex today for reflux.  This showed no evidence of DVT.  She did have mild reflux on the right side but only at the saphenofemoral junction.  From the junction down the vein was competent.  It was 6 mm in diameter.  On the left side she had no significant reflux.  She had no deep vein reflux bilaterally.  ASSESSMENT: Bilateral varicose veins secondary to venous hypertension.  She does not really have significant superficial venous reflux that would warrant laser ablation.  Mainstay of therapy is going to be weight loss and compression stockings.  I do not believe her thigh pain is related to her veins and is more related to her back.   PLAN: Patient was fitted for 20 to 30 mm knee-high  compression stockings today.  She will follow-up with [place] on an as-needed basis.  However, she does not really have much reflux and I believe weight loss is really more of the primary issue with her rather than considering any intervention for her varicose veins.  We will be happy to see her back in follow-up after she has her back operation done if she still has persistent symptoms.   [name], MD [facility] Office: [phone]

## 2020-06-13 ENCOUNTER — Encounter: Payer: Self-pay | Admitting: Internal Medicine

## 2020-06-13 ENCOUNTER — Encounter: Payer: Self-pay | Admitting: Physical Medicine & Rehabilitation

## 2020-06-13 MED ORDER — LEVOTHYROXINE SODIUM 88 MCG PO TABS
ORAL_TABLET | ORAL | 3 refills | Status: DC
Start: 2020-06-13 — End: 2020-07-08

## 2020-06-18 ENCOUNTER — Other Ambulatory Visit (HOSPITAL_COMMUNITY): Payer: Self-pay

## 2020-06-18 NOTE — Progress Notes (Signed)
Paramedicine Encounter    Patient ID: Rhonda [name], female    DOB: 05-08-1951, 70 y.o.   MRN: [id number]   Patient Care Team: Maurice Small, MD as PCP - General (Family Medicine) [name], LCSW as Social Worker (Licensed Clinical Social Worker) [name], [name], DO as Consulting Physician (Neurology)  Patient Active Problem List   Diagnosis Date Noted  . Abnormal gait 06/11/2020  . Adult failure to thrive syndrome 06/11/2020  . Allergic rhinitis 06/11/2020  . Anxiety 06/11/2020  . Asthma 06/11/2020  . Benign intracranial hypertension 06/11/2020  . Body mass index (BMI) 45.0-49.9, adult ([place]) 06/11/2020  . Bowel incontinence 06/11/2020  . Cholelithiasis 06/11/2020  . Chronic sinusitis 06/11/2020  . 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  . Daytime somnolence 06/11/2020  . Diarrhea of presumed infectious origin 06/11/2020  . Edema 06/11/2020  . Family history of malignant neoplasm of gastrointestinal tract 06/11/2020  . Gout 06/11/2020  . History of fall 06/11/2020  . History of infectious disease 06/11/2020  . Insomnia 06/11/2020  . Irregular bowel habits 06/11/2020  . 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 Lumbar spondylosis with myelopathy 06/11/2020  . Macrocytosis 06/11/2020  . Mild recurrent major depression ([place]) 06/11/2020  . Personal history of colonic polyps 06/11/2020  . Personal history of malignant neoplasm of breast 06/11/2020  . Recurrent falls 06/11/2020  . Mixed anxiety and depressive disorder 06/11/2020  . Irritable bowel syndrome 01/04/2020  . Spinal stenosis of lumbar region 01/03/2020  . Diabetes mellitus with neuropathy ([place]) 06/15/2019  . Hypokalemia 11/08/2018  . CKD (chronic kidney disease) stage 3, GFR 30-59 ml/min ([facility]) 11/08/2018  . Orthostatic hypotension 07/28/2017  . SVD (spontaneous vaginal delivery)   . Peripheral neuropathy   . On home oxygen therapy   . Migraines   . Left bundle branch block   . Hypothyroidism   . Hypertension   . Hyperlipidemia    . Heart murmur   . GERD (gastroesophageal reflux disease)   . Exertional shortness of breath   . Major depressive disorder   . Back pain   . Arthritis   . Generalized anxiety disorder   . Anemia   . Chronic respiratory failure ([place]) 09/14/2013  . 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 Morbid obesity ([place]) 11/01/2012  . Chronic systolic heart failure ([place]) 10/27/2012  . Endometrial polyp 01/20/2012  . Malignant tumor of breast ([place]) 03/26/2011  . Unspecified vitamin D deficiency 03/26/2011    Current Outpatient Medications:  .  acetaZOLAMIDE (DIAMOX) 125 MG tablet, TAKE 1 TABLET(125 MG) BY MOUTH DAILY, Disp: 90 tablet, Rfl: 3 .  carvedilol (COREG) 3.125 MG tablet, TAKE 1 TABLET(3.125 MG) BY MOUTH TWICE DAILY WITH A MEAL, Disp: 180 tablet, Rfl: 3 .  citalopram (CELEXA) 20 MG tablet, Take 20 mg by mouth daily., Disp: , Rfl:  .  dicyclomine (BENTYL) 20 MG tablet, Take 20 mg by mouth every 6 (six) hours., Disp: , Rfl:  .  DULoxetine (CYMBALTA) 30 MG capsule, Take 30 mg by mouth daily. Take once in the morning., Disp: , Rfl:  .  fexofenadine (ALLEGRA) 180 MG tablet, Take 180 mg by mouth 2 (two) times daily., Disp: , Rfl:  .  fludrocortisone (FLORINEF) 0.1 MG tablet, TAKE 1 TABLET(0.1 MG) BY MOUTH TWICE DAILY, Disp: 60 tablet, Rfl: 3 .  gabapentin (NEURONTIN) 300 MG capsule, Take 300 mg by mouth 3 (three) times daily., Disp: , Rfl:  .  gabapentin (NEURONTIN) 800 MG tablet, Take 1 tablet (800 mg total) by mouth  at bedtime as needed (neuropathy)., Disp: 90 tablet, Rfl: 1 .  Ginkgo Biloba 120 MG TABS, Take by mouth., Disp: , Rfl:  .  hydrOXYzine (ATARAX/VISTARIL) 25 MG tablet, Take 25 mg by mouth 2 (two) times daily. Takes Morning and Bedtime, Disp: , Rfl:  .  hydrOXYzine (VISTARIL) 25 MG capsule, Take 25 mg by mouth 3 (three) times daily as needed. , Disp: , Rfl:  .  ipratropium (ATROVENT) 0.03 % nasal spray, Place 2 sprays into both nostrils 2 (two)  times daily., Disp: , Rfl:  .  levothyroxine (SYNTHROID) 88 MCG tablet, Take 1 tablet by mouth before breakfast, Disp: 90 tablet, Rfl: 3 .  metolazone (ZAROXOLYN) 2.5 MG tablet, Take 1 tablet by mouth every Tuesday, Disp: 12 tablet, Rfl: 3 .  midodrine (PROAMATINE) 10 MG tablet, Take 1 tablet (10 mg total) by mouth 3 (three) times daily with meals., Disp: 270 tablet, Rfl: 3 .  mirtazapine (REMERON) 30 MG tablet, TAKE 1 TABLET BY MOUTH EVERY NIGHT AT BEDTIME, Disp: 30 tablet, Rfl: 3 .  montelukast (SINGULAIR) 10 MG tablet, Take 10 mg by mouth at bedtime. , Disp: , Rfl: 1 .  omeprazole (PRILOSEC) 40 MG capsule, Take 40 mg by mouth daily., Disp: , Rfl:  .  potassium chloride SA (KLOR-CON) 20 MEQ tablet, TAKE 2 TABLETS THREE TIMES DAILY. TAKE AN EXTRA 2 TABLETS ON DAYS YOU TAKE METOLAZONE (Patient taking differently: Take 20 mEq by mouth 3 (three) times daily. Take 3 tablets three times daily. Take an extra 2 tablets on days you take Metolazone), Disp: 572 tablet, Rfl: 0 .  probenecid (BENEMID) 500 MG tablet, Take 500 mg by mouth 2 (two) times daily., Disp: , Rfl:  .  simvastatin (ZOCOR) 10 MG tablet, Take 10 mg by mouth every evening., Disp: , Rfl:  .  spironolactone (ALDACTONE) 25 MG tablet, Take 1 tablet (25 mg total) by mouth every evening., Disp: 90 tablet, Rfl: 3 .  torsemide (DEMADEX) 20 MG tablet, Take 3 tablets (60 mg total) by mouth 2 (two) times daily., Disp: 540 tablet, Rfl: 3 .  traMADol (ULTRAM) 50 MG tablet, Take by mouth every 6 (six) hours as needed., Disp: , Rfl:  Allergies  Allergen Reactions  . Ceftin Anaphylaxis    Face and throat swell   . Geodon [Ziprasidone Hcl] Hives  . Lisinopril Other (See Comments)    angioedema  . Shellfish Allergy Other (See Comments)    Gout exacerbation  . Allopurinol Nausea Only and Other (See Comments)    weakness  . Ativan [Lorazepam] Itching  . Sulfa Antibiotics Itching  . Ultram [Tramadol Hcl] Itching  . Valium [Diazepam] Other (See  Comments)    Patient states that diazepam doesn't relax, it has the opposite effect.     Social History   Socioeconomic History  . Marital status: Married    Spouse name: Not on file  . Number of children: 2  . Years of education: 69  . Highest education level: Master's degree (e.g., MA, MS, MEng, MEd, MSW, MBA)  Occupational History  . Occupation: retired  Tobacco Use  . Smoking status: Current Some Day Smoker    Packs/day: 0.10    Years: 26.00    Pack years: 2.60    Types: Cigarettes    Last attempt to quit: 05/06/2007    Years since quitting: 13.1  . Smokeless tobacco: Never Used  Vaping Use  . Vaping Use: Never used  Substance and Sexual Activity  . Alcohol use: No  .  Drug use: No  . Sexual activity: Yes  Other Topics Concern  . 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  Constitutional:      Appearance: She is normal weight.  HENT:     Head: Normocephalic.     Nose: Nose normal.     Mouth/Throat:     Mouth: Mucous membranes are moist.     Pharynx: Oropharynx is clear.  Eyes:     Pupils: Pupils are equal, round, and reactive to light.  Cardiovascular:     Rate and Rhythm: Normal rate and regular rhythm.     Pulses: Normal pulses.     Heart sounds: Normal heart sounds.  Pulmonary:     Effort: Pulmonary effort is normal.  Abdominal:     General:  Abdomen is flat.     Palpations: Abdomen is soft.  Musculoskeletal:        General: Normal range of motion.     Cervical back: Normal range of motion.  Skin:    General: Skin is warm and dry.     Capillary Refill: Capillary refill takes less than 2 seconds.  Neurological:     General: No focal deficit present.     Mental Status: She is alert. Mental status is at baseline.  Psychiatric:        Mood and Affect: Mood normal.     Arrived for home visit for [facility] who was alert and oriented ambulating around her home. Ellasyn reports having a headache after striking her head on a cabinet in the kitchen a few days ago.  [place] reports she didn't lose consciousness and reports she is feeling okay otherwise. Small scab noted to the top of her head, no swelling noted. Vitals obtained. Pill box filled last week confirmed. Mita missed one dose of noon medications over the last week. All other medications she has been compliant with. [name] and I reviewed appointments and I wrote down same with dates, times and addresses. [place] was advised about referral to health coach with HF clinic. Aaliya was excited about same. [name] agreed with visit in one week. Home visit complete.   Refills: NONE     Future Appointments  Date Time Provider Liberal  07/01/2020  7:50 AM [facility] DEVICE REMOTES [facility]  07/11/2020  7:40 AM [facility] DEVICE REMOTES [facility]  07/18/2020  2:30 PM Kirsteins, Luanna Salk, MD [facility]  07/25/2020  2:00 PM Evans Lance, MD [facility]  10/10/2020  7:40 AM [facility] DEVICE REMOTES [facility]  01/09/2021  7:40 AM [facility] DEVICE REMOTES [facility]  02/27/2021  9:50 AM Narda Amber K, DO [facility] None  04/10/2021  7:40 AM [facility] DEVICE REMOTES [facility]  07/10/2021  7:40 AM [facility] DEVICE REMOTES [facility]  [facility]  08/11/2021  7:40 AM [facility] DEVICE  REMOTES [facility]  09/10/2021  7:40 AM [facility] DEVICE REMOTES [facility]     ACTION: Home visit completed Next visit planned for one week

## 2020-06-20 DIAGNOSIS — E1140 Type 2 diabetes mellitus with diabetic neuropathy, unspecified: Secondary | ICD-10-CM | POA: Diagnosis not present

## 2020-06-20 DIAGNOSIS — F32A Depression, unspecified: Secondary | ICD-10-CM | POA: Diagnosis not present

## 2020-06-20 DIAGNOSIS — J449 Chronic obstructive pulmonary disease, unspecified: Secondary | ICD-10-CM | POA: Diagnosis not present

## 2020-06-20 DIAGNOSIS — I5022 Chronic systolic (congestive) heart failure: Secondary | ICD-10-CM | POA: Diagnosis not present

## 2020-06-20 DIAGNOSIS — M48061 Spinal stenosis, lumbar region without neurogenic claudication: Secondary | ICD-10-CM | POA: Diagnosis not present

## 2020-06-20 DIAGNOSIS — I130 Hypertensive heart and chronic kidney disease with heart failure and stage 1 through stage 4 chronic kidney disease, or unspecified chronic kidney disease: Secondary | ICD-10-CM | POA: Diagnosis not present

## 2020-06-20 DIAGNOSIS — N183 Chronic kidney disease, stage 3 unspecified: Secondary | ICD-10-CM | POA: Diagnosis not present

## 2020-06-20 DIAGNOSIS — G4700 Insomnia, unspecified: Secondary | ICD-10-CM | POA: Diagnosis not present

## 2020-06-20 DIAGNOSIS — R519 Headache, unspecified: Secondary | ICD-10-CM | POA: Diagnosis not present

## 2020-06-25 ENCOUNTER — Other Ambulatory Visit (HOSPITAL_COMMUNITY): Payer: Self-pay

## 2020-06-25 NOTE — Progress Notes (Signed)
Paramedicine Encounter    Patient ID: Rhonda [name], female    DOB: December 30, 1950, 70 y.o.   MRN: [id number]   Patient Care Team: Maurice Small, MD as PCP - General (Family Medicine) [name], LCSW as Social Worker (Licensed Clinical Social Worker) [name], [name], DO as Consulting Physician (Neurology)  Patient Active Problem List   Diagnosis Date Noted  . Abnormal gait 06/11/2020  . Adult failure to thrive syndrome 06/11/2020  . Allergic rhinitis 06/11/2020  . Anxiety 06/11/2020  . Asthma 06/11/2020  . Benign intracranial hypertension 06/11/2020  . Body mass index (BMI) 45.0-49.9, adult ([place]) 06/11/2020  . Bowel incontinence 06/11/2020  . Cholelithiasis 06/11/2020  . Chronic sinusitis 06/11/2020  . 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  . Daytime somnolence 06/11/2020  . Diarrhea of presumed infectious origin 06/11/2020  . Edema 06/11/2020  . Family history of malignant neoplasm of gastrointestinal tract 06/11/2020  . Gout 06/11/2020  . History of fall 06/11/2020  . History of infectious disease 06/11/2020  . Insomnia 06/11/2020  . Irregular bowel habits 06/11/2020  . 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 Lumbar spondylosis with myelopathy 06/11/2020  . Macrocytosis 06/11/2020  . Mild recurrent major depression ([place]) 06/11/2020  . Personal history of colonic polyps 06/11/2020  . Personal history of malignant neoplasm of breast 06/11/2020  . Recurrent falls 06/11/2020  . Mixed anxiety and depressive disorder 06/11/2020  . Irritable bowel syndrome 01/04/2020  . Spinal stenosis of lumbar region 01/03/2020  . Diabetes mellitus with neuropathy ([place]) 06/15/2019  . Hypokalemia 11/08/2018  . CKD (chronic kidney disease) stage 3, GFR 30-59 ml/min (HCC) 11/08/2018  . Orthostatic hypotension 07/28/2017  . SVD (spontaneous vaginal delivery)   . Peripheral neuropathy   . On home oxygen therapy   . Migraines   . Left bundle branch block   . Hypothyroidism   . Hypertension   . Hyperlipidemia    . Heart murmur   . GERD (gastroesophageal reflux disease)   . Exertional shortness of breath   . Major depressive disorder   . Back pain   . Arthritis   . Generalized anxiety disorder   . Anemia   . Chronic respiratory failure ([place]) 09/14/2013  . 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 Morbid obesity ([place]) 11/01/2012  . Chronic systolic heart failure ([place]) 10/27/2012  . Endometrial polyp 01/20/2012  . Malignant tumor of breast ([place]) 03/26/2011  . Unspecified vitamin D deficiency 03/26/2011    Current Outpatient Medications:  .  acetaZOLAMIDE (DIAMOX) 125 MG tablet, TAKE 1 TABLET(125 MG) BY MOUTH DAILY, Disp: 90 tablet, Rfl: 3 .  carvedilol (COREG) 3.125 MG tablet, TAKE 1 TABLET(3.125 MG) BY MOUTH TWICE DAILY WITH A MEAL, Disp: 180 tablet, Rfl: 3 .  citalopram (CELEXA) 20 MG tablet, Take 20 mg by mouth daily., Disp: , Rfl:  .  dicyclomine (BENTYL) 20 MG tablet, Take 20 mg by mouth every 6 (six) hours., Disp: , Rfl:  .  DULoxetine (CYMBALTA) 30 MG capsule, Take 30 mg by mouth daily. Take once in the morning., Disp: , Rfl:  .  fexofenadine (ALLEGRA) 180 MG tablet, Take 180 mg by mouth 2 (two) times daily., Disp: , Rfl:  .  fludrocortisone (FLORINEF) 0.1 MG tablet, TAKE 1 TABLET(0.1 MG) BY MOUTH TWICE DAILY, Disp: 60 tablet, Rfl: 3 .  gabapentin (NEURONTIN) 300 MG capsule, Take 300 mg by mouth 3 (three) times daily., Disp: , Rfl:  .  gabapentin (NEURONTIN) 800 MG tablet, Take 1 tablet (800 mg total) by mouth  at bedtime as needed (neuropathy)., Disp: 90 tablet, Rfl: 1 .  Ginkgo Biloba 120 MG TABS, Take by mouth., Disp: , Rfl:  .  hydrOXYzine (ATARAX/VISTARIL) 25 MG tablet, Take 25 mg by mouth 2 (two) times daily. Takes Morning and Bedtime, Disp: , Rfl:  .  hydrOXYzine (VISTARIL) 25 MG capsule, Take 25 mg by mouth 3 (three) times daily as needed. , Disp: , Rfl:  .  ipratropium (ATROVENT) 0.03 % nasal spray, Place 2 sprays into both nostrils 2 (two)  times daily., Disp: , Rfl:  .  levothyroxine (SYNTHROID) 88 MCG tablet, Take 1 tablet by mouth before breakfast, Disp: 90 tablet, Rfl: 3 .  metolazone (ZAROXOLYN) 2.5 MG tablet, Take 1 tablet by mouth every Tuesday, Disp: 12 tablet, Rfl: 3 .  midodrine (PROAMATINE) 10 MG tablet, Take 1 tablet (10 mg total) by mouth 3 (three) times daily with meals., Disp: 270 tablet, Rfl: 3 .  mirtazapine (REMERON) 30 MG tablet, TAKE 1 TABLET BY MOUTH EVERY NIGHT AT BEDTIME, Disp: 30 tablet, Rfl: 3 .  montelukast (SINGULAIR) 10 MG tablet, Take 10 mg by mouth at bedtime. , Disp: , Rfl: 1 .  omeprazole (PRILOSEC) 40 MG capsule, Take 40 mg by mouth daily., Disp: , Rfl:  .  potassium chloride SA (KLOR-CON) 20 MEQ tablet, TAKE 2 TABLETS THREE TIMES DAILY. TAKE AN EXTRA 2 TABLETS ON DAYS YOU TAKE METOLAZONE (Patient taking differently: Take 20 mEq by mouth 3 (three) times daily. Take 3 tablets three times daily. Take an extra 2 tablets on days you take Metolazone), Disp: 572 tablet, Rfl: 0 .  probenecid (BENEMID) 500 MG tablet, Take 500 mg by mouth 2 (two) times daily., Disp: , Rfl:  .  simvastatin (ZOCOR) 10 MG tablet, Take 10 mg by mouth every evening., Disp: , Rfl:  .  spironolactone (ALDACTONE) 25 MG tablet, Take 1 tablet (25 mg total) by mouth every evening., Disp: 90 tablet, Rfl: 3 .  torsemide (DEMADEX) 20 MG tablet, Take 3 tablets (60 mg total) by mouth 2 (two) times daily., Disp: 540 tablet, Rfl: 3 .  traMADol (ULTRAM) 50 MG tablet, Take by mouth every 6 (six) hours as needed. (Patient not taking: Reported on 06/18/2020), Disp: , Rfl:  Allergies  Allergen Reactions  . Ceftin Anaphylaxis    Face and throat swell   . Geodon [Ziprasidone Hcl] Hives  . Lisinopril Other (See Comments)    angioedema  . Shellfish Allergy Other (See Comments)    Gout exacerbation  . Allopurinol Nausea Only and Other (See Comments)    weakness  . Ativan [Lorazepam] Itching  . Sulfa Antibiotics Itching  . Ultram [Tramadol Hcl]  Itching  . Valium [Diazepam] Other (See Comments)    Patient states that diazepam doesn't relax, it has the opposite effect.     Social History   Socioeconomic History  . Marital status: Married    Spouse name: Not on file  . Number of children: 2  . Years of education: 67  . Highest education level: Master's degree (e.g., MA, MS, MEng, MEd, MSW, MBA)  Occupational History  . Occupation: retired  Tobacco Use  . Smoking status: Current Some Day Smoker    Packs/day: 0.10    Years: 26.00    Pack years: 2.60    Types: Cigarettes    Last attempt to quit: 05/06/2007    Years since quitting: 13.1  . Smokeless tobacco: Never Used  Vaping Use  . Vaping Use: Never used  Substance and Sexual  Activity  . Alcohol use: No  . Drug use: No  . Sexual activity: Yes  Other Topics Concern  . 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  Constitutional:      Appearance: She is normal weight.  HENT:     Head: Normocephalic.     Nose: Nose normal.     Mouth/Throat:     Mouth: Mucous membranes are moist.     Pharynx: Oropharynx is clear.  Eyes:     Pupils: Pupils are equal, round, and reactive to light.  Cardiovascular:     Rate and Rhythm: Normal rate and regular rhythm.     Pulses: Normal pulses.     Heart sounds: Normal heart sounds.  Pulmonary:     Effort: Pulmonary effort is  normal.     Breath sounds: Normal breath sounds.  Abdominal:     General: Abdomen is flat.     Palpations: Abdomen is soft.  Musculoskeletal:        General: Swelling present. Normal range of motion.     Cervical back: Normal range of motion.     Right lower leg: No edema.     Left lower leg: No edema.  Skin:    General: Skin is warm and dry.     Capillary Refill: Capillary refill takes less than 2 seconds.  Neurological:     General: No focal deficit present.     Mental Status: She is alert. Mental status is at baseline.  Psychiatric:        Mood and Affect: Mood normal.    Arrived for home visit for Rhonda [name] who was alert and oriented reporting she was feeling good today. No complaints of shortness of breath, dizziness, chest pain or trouble sleeping, eating or preforming ADL's. Vitals were obtained. Patient was compliant with meds over the last two week.s I reviewed medications and filled one pill box. Patient reports she needed a medication applicator for her vaginal creams, I will reach out to her OBGYN for same. I will see Zavia in one week. Home visit complete.   Refills:  Gabapentin- [place]        Future Appointments  Date Time Provider [place]  07/01/2020  7:50 AM [facility] DEVICE REMOTES [facility]  07/11/2020  7:40 AM [facility] DEVICE REMOTES [facility]  07/18/2020  2:30 PM Kirsteins, Luanna Salk, MD [facility]  07/25/2020  2:00 PM Evans Lance, MD [facility]  10/10/2020  7:40 AM [facility] DEVICE REMOTES [facility]  01/09/2021  7:40 AM [facility] DEVICE REMOTES [facility]  02/27/2021  9:50 AM Narda Amber K, DO [facility] None  04/10/2021  7:40 AM [facility] DEVICE REMOTES [facility]  07/10/2021  7:40 AM [facility] DEVICE REMOTES  [facility]  08/11/2021  7:40 AM [facility] DEVICE REMOTES [facility]  09/10/2021  7:40 AM  [facility] DEVICE REMOTES [facility]     ACTION: Home visit completed Next visit planned for one week

## 2020-07-01 ENCOUNTER — Ambulatory Visit (INDEPENDENT_AMBULATORY_CARE_PROVIDER_SITE_OTHER): Payer: Medicare HMO

## 2020-07-01 ENCOUNTER — Other Ambulatory Visit (HOSPITAL_COMMUNITY): Payer: Self-pay | Admitting: Internal Medicine

## 2020-07-01 DIAGNOSIS — Z95810 Presence of automatic (implantable) cardiac defibrillator: Secondary | ICD-10-CM

## 2020-07-01 DIAGNOSIS — I5022 Chronic systolic (congestive) heart failure: Secondary | ICD-10-CM

## 2020-07-02 ENCOUNTER — Other Ambulatory Visit (HOSPITAL_COMMUNITY): Payer: Self-pay

## 2020-07-02 NOTE — Progress Notes (Signed)
Paramedicine Encounter    Patient ID: Rhonda [name], female    DOB: 14-Nov-1950, 70 y.o.   MRN: [id number]   Patient Care Team: Maurice Small, MD as PCP - General (Family Medicine) [name], LCSW as Social Worker (Licensed Clinical Social Worker) [name], [name], DO as Consulting Physician (Neurology)  Patient Active Problem List   Diagnosis Date Noted  . Abnormal gait 06/11/2020  . Adult failure to thrive syndrome 06/11/2020  . Allergic rhinitis 06/11/2020  . Anxiety 06/11/2020  . Asthma 06/11/2020  . Benign intracranial hypertension 06/11/2020  . Body mass index (BMI) 45.0-49.9, adult ([place]) 06/11/2020  . Bowel incontinence 06/11/2020  . Cholelithiasis 06/11/2020  . Chronic sinusitis 06/11/2020  . 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  . Daytime somnolence 06/11/2020  . Diarrhea of presumed infectious origin 06/11/2020  . Edema 06/11/2020  . Family history of malignant neoplasm of gastrointestinal tract 06/11/2020  . Gout 06/11/2020  . History of fall 06/11/2020  . History of infectious disease 06/11/2020  . Insomnia 06/11/2020  . Irregular bowel habits 06/11/2020  . 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 Lumbar spondylosis with myelopathy 06/11/2020  . Macrocytosis 06/11/2020  . Mild recurrent major depression ([place]) 06/11/2020  . Personal history of colonic polyps 06/11/2020  . Personal history of malignant neoplasm of breast 06/11/2020  . Recurrent falls 06/11/2020  . Mixed anxiety and depressive disorder 06/11/2020  . Irritable bowel syndrome 01/04/2020  . Spinal stenosis of lumbar region 01/03/2020  . Diabetes mellitus with neuropathy ([place]) 06/15/2019  . Hypokalemia 11/08/2018  . CKD (chronic kidney disease) stage 3, GFR 30-59 ml/min (HCC) 11/08/2018  . Orthostatic hypotension 07/28/2017  . SVD (spontaneous vaginal delivery)   . Peripheral neuropathy   . On home oxygen therapy   . Migraines   . Left bundle branch block   . Hypothyroidism   . Hypertension   . Hyperlipidemia    . Heart murmur   . GERD (gastroesophageal reflux disease)   . Exertional shortness of breath   . Major depressive disorder   . Back pain   . Arthritis   . Generalized anxiety disorder   . Anemia   . Chronic respiratory failure ([place]) 09/14/2013  . 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 Morbid obesity ([place]) 11/01/2012  . Chronic systolic heart failure ([place]) 10/27/2012  . Endometrial polyp 01/20/2012  . Malignant tumor of breast ([place]) 03/26/2011  . Unspecified vitamin D deficiency 03/26/2011    Current Outpatient Medications:  .  acetaZOLAMIDE (DIAMOX) 125 MG tablet, TAKE 1 TABLET(125 MG) BY MOUTH DAILY, Disp: 90 tablet, Rfl: 3 .  carvedilol (COREG) 3.125 MG tablet, TAKE 1 TABLET(3.125 MG) BY MOUTH TWICE DAILY WITH A MEAL, Disp: 180 tablet, Rfl: 3 .  citalopram (CELEXA) 20 MG tablet, Take 20 mg by mouth daily., Disp: , Rfl:  .  dicyclomine (BENTYL) 20 MG tablet, Take 20 mg by mouth every 6 (six) hours., Disp: , Rfl:  .  DULoxetine (CYMBALTA) 30 MG capsule, Take 30 mg by mouth daily. Take once in the morning., Disp: , Rfl:  .  fexofenadine (ALLEGRA) 180 MG tablet, Take 180 mg by mouth 2 (two) times daily. (Patient not taking: Reported on 06/25/2020), Disp: , Rfl:  .  fludrocortisone (FLORINEF) 0.1 MG tablet, TAKE 1 TABLET(0.1 MG) BY MOUTH TWICE DAILY, Disp: 60 tablet, Rfl: 3 .  gabapentin (NEURONTIN) 300 MG capsule, Take 300 mg by mouth 3 (three) times daily., Disp: , Rfl:  .  gabapentin (NEURONTIN) 800 MG tablet, Take 1  tablet (800 mg total) by mouth at bedtime as needed (neuropathy)., Disp: 90 tablet, Rfl: 1 .  Ginkgo Biloba 120 MG TABS, Take by mouth., Disp: , Rfl:  .  hydrOXYzine (ATARAX/VISTARIL) 25 MG tablet, Take 25 mg by mouth 2 (two) times daily. Takes Morning and Bedtime, Disp: , Rfl:  .  hydrOXYzine (VISTARIL) 25 MG capsule, Take 25 mg by mouth 3 (three) times daily as needed. , Disp: , Rfl:  .  ipratropium (ATROVENT) 0.03 % nasal spray,  Place 2 sprays into both nostrils 2 (two) times daily., Disp: , Rfl:  .  levothyroxine (SYNTHROID) 88 MCG tablet, Take 1 tablet by mouth before breakfast, Disp: 90 tablet, Rfl: 3 .  metolazone (ZAROXOLYN) 2.5 MG tablet, Take 1 tablet by mouth every Tuesday, Disp: 12 tablet, Rfl: 3 .  midodrine (PROAMATINE) 10 MG tablet, TAKE 1 TABLET (10 MG TOTAL) BY MOUTH 3 (THREE) TIMES DAILY WITH MEALS., Disp: 270 tablet, Rfl: 3 .  mirtazapine (REMERON) 30 MG tablet, TAKE 1 TABLET BY MOUTH EVERY NIGHT AT BEDTIME, Disp: 30 tablet, Rfl: 3 .  montelukast (SINGULAIR) 10 MG tablet, Take 10 mg by mouth at bedtime. , Disp: , Rfl: 1 .  omeprazole (PRILOSEC) 40 MG capsule, Take 40 mg by mouth daily., Disp: , Rfl:  .  potassium chloride SA (KLOR-CON) 20 MEQ tablet, TAKE 2 TABLETS THREE TIMES DAILY. TAKE AN EXTRA 2 TABLETS ON DAYS YOU TAKE METOLAZONE (Patient taking differently: Take 20 mEq by mouth 3 (three) times daily. Take 3 tablets three times daily. Take an extra 2 tablets on days you take Metolazone), Disp: 572 tablet, Rfl: 0 .  probenecid (BENEMID) 500 MG tablet, Take 500 mg by mouth 2 (two) times daily., Disp: , Rfl:  .  simvastatin (ZOCOR) 10 MG tablet, Take 10 mg by mouth every evening., Disp: , Rfl:  .  spironolactone (ALDACTONE) 25 MG tablet, Take 1 tablet (25 mg total) by mouth every evening., Disp: 90 tablet, Rfl: 3 .  torsemide (DEMADEX) 20 MG tablet, Take 3 tablets (60 mg total) by mouth 2 (two) times daily., Disp: 540 tablet, Rfl: 3 .  traMADol (ULTRAM) 50 MG tablet, Take by mouth every 6 (six) hours as needed., Disp: , Rfl:  Allergies  Allergen Reactions  . Ceftin Anaphylaxis    Face and throat swell   . Geodon [Ziprasidone Hcl] Hives  . Lisinopril Other (See Comments)    angioedema  . Shellfish Allergy Other (See Comments)    Gout exacerbation  . Allopurinol Nausea Only and Other (See Comments)    weakness  . Ativan [Lorazepam] Itching  . Sulfa Antibiotics Itching  . Ultram [Tramadol Hcl]  Itching  . Valium [Diazepam] Other (See Comments)    Patient states that diazepam doesn't relax, it has the opposite effect.     Social History   Socioeconomic History  . Marital status: Married    Spouse name: Not on file  . Number of children: 2  . Years of education: 42  . Highest education level: Master's degree (e.g., MA, MS, MEng, MEd, MSW, MBA)  Occupational History  . Occupation: retired  Tobacco Use  . Smoking status: Current Some Day Smoker    Packs/day: 0.10    Years: 26.00    Pack years: 2.60    Types: Cigarettes    Last attempt to quit: 05/06/2007    Years since quitting: 13.1  . Smokeless tobacco: Never Used  Vaping Use  . Vaping Use: Never used  Substance and Sexual  Activity  . Alcohol use: No  . Drug use: No  . Sexual activity: Yes  Other Topics Concern  . 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  Constitutional:      Appearance: She is normal weight.  HENT:     Head: Normocephalic.     Nose: Nose normal.     Mouth/Throat:     Mouth: Mucous membranes are moist.     Pharynx: Oropharynx is clear.  Eyes:     Pupils: Pupils are equal, round, and reactive to light.  Cardiovascular:     Rate and Rhythm: Normal rate and regular rhythm.     Pulses: Normal pulses.     Heart sounds: Normal heart sounds.  Pulmonary:     Effort: Pulmonary effort is  normal.     Breath sounds: Normal breath sounds.  Abdominal:     General: Abdomen is flat.     Palpations: Abdomen is soft.  Musculoskeletal:        General: Normal range of motion.     Cervical back: Normal range of motion.     Right lower leg: No edema.     Left lower leg: No edema.  Skin:    General: Skin is warm and dry.     Capillary Refill: Capillary refill takes less than 2 seconds.  Neurological:     General: No focal deficit present.     Mental Status: She is alert. Mental status is at baseline.  Psychiatric:        Mood and Affect: Mood normal.     Arrived for home visit for Wandra who was ambulating around her home with no complaints on assessment. Genny reports she has been feeling good with no dizziness, chest pain, shortness of breath. Elesha's weight is up a few lbs this week and she stated it is due to her eating habits over the last week. I educated Fernande on things she should be eating and things to avoid. I did give Mrs. Mcglasson's name to our new health coach for referral for health classes this should be starting in the coming weeks. Daisia expressed thanks for this. Vitals are as noted. Medications were reviewed and confirmed. Pill box filled accordingly. Loriana had multiple hydroxyzine's on the side that she did not take. Brendan reports she thinks they make her more confused and she is refusing to take them. I did not add these into her pill box. Legs were swollen to her normal but no pitting edema. Lung sounds clear with no JVD.  [name] and I reviewed appointments. Home visit complete. I will see Geni in one week.   Refills:  Gabapentin 800mg   Future Appointments  Date Time Provider [place]  07/11/2020  7:40 AM [facility] DEVICE  REMOTES [facility]  07/18/2020  2:30 PM Kirsteins, Luanna Salk, MD [facility] CPR  07/25/2020  2:00 PM Evans Lance, MD [facility]  10/10/2020  7:40 AM [facility] DEVICE REMOTES [facility]  [facility]  01/09/2021  7:40 AM [facility] DEVICE REMOTES [facility]  02/27/2021  9:50 AM Narda Amber K, DO [facility] None  04/10/2021  7:40 AM [facility] DEVICE REMOTES [facility]  07/10/2021  7:40 AM [facility] DEVICE REMOTES [facility]  08/11/2021  7:40 AM [facility] DEVICE REMOTES [facility]  09/10/2021  7:40 AM [facility] DEVICE REMOTES [facility]     ACTION: Home visit completed Next visit planned for one week

## 2020-07-03 ENCOUNTER — Other Ambulatory Visit: Payer: Self-pay | Admitting: Neurology

## 2020-07-03 DIAGNOSIS — Z124 Encounter for screening for malignant neoplasm of cervix: Secondary | ICD-10-CM | POA: Diagnosis not present

## 2020-07-05 DIAGNOSIS — F32A Depression, unspecified: Secondary | ICD-10-CM | POA: Diagnosis not present

## 2020-07-05 DIAGNOSIS — E1140 Type 2 diabetes mellitus with diabetic neuropathy, unspecified: Secondary | ICD-10-CM | POA: Diagnosis not present

## 2020-07-05 DIAGNOSIS — J0101 Acute recurrent maxillary sinusitis: Secondary | ICD-10-CM | POA: Diagnosis not present

## 2020-07-05 DIAGNOSIS — M48061 Spinal stenosis, lumbar region without neurogenic claudication: Secondary | ICD-10-CM | POA: Diagnosis not present

## 2020-07-05 DIAGNOSIS — I130 Hypertensive heart and chronic kidney disease with heart failure and stage 1 through stage 4 chronic kidney disease, or unspecified chronic kidney disease: Secondary | ICD-10-CM | POA: Diagnosis not present

## 2020-07-05 DIAGNOSIS — R519 Headache, unspecified: Secondary | ICD-10-CM | POA: Diagnosis not present

## 2020-07-05 DIAGNOSIS — I5022 Chronic systolic (congestive) heart failure: Secondary | ICD-10-CM | POA: Diagnosis not present

## 2020-07-05 DIAGNOSIS — G4700 Insomnia, unspecified: Secondary | ICD-10-CM | POA: Diagnosis not present

## 2020-07-05 DIAGNOSIS — J449 Chronic obstructive pulmonary disease, unspecified: Secondary | ICD-10-CM | POA: Diagnosis not present

## 2020-07-05 DIAGNOSIS — N183 Chronic kidney disease, stage 3 unspecified: Secondary | ICD-10-CM | POA: Diagnosis not present

## 2020-07-05 NOTE — Progress Notes (Signed)
EPIC Encounter for ICM Monitoring  Patient Name: Rhonda [name] is a 70 y.o. female Date: 07/05/2020 Primary Care Physican: Maurice Small, MD Primary Cardiologist:Bensimhon Electrophysiologist: Santina Evans Pacing:98.6% 1/19/2022OfficeWeight:204lbs   Transmission reviewed.  Optivol thoracic impedancenormal.  Prescribed: Patient participating in paramedicine program to assist with meds.  Torsemide 20 mg3tablets (60 mg total)by mouthtwice a day.  Potassium 20 meq2tablets (40 mEq total) by mouth 3 (three) times daily.Take extra 40 mEq on days you take Metolazone.  Metolazone 2.5mg  1 tabletevery Tuesday.  Spironolactone 25 mg take 1 tablet by mouth every evening.  Labs: 04/09/2020 Creatinine 1.44, BUN 40, Potassium 4.9, Sodium 142  03/27/2020 Creatinine 1.71, BUN 33, Potassium 3.8, Sodium 141, GFR 32  03/14/2020 Creatinine 1.67, BUN 30, Potassium 3.4, Sodium 139, GFR 33  A complete set of results can be found in Results Review.  Recommendations:No changes.  Follow-up plan: ICM clinic phone appointment on3/14/2022. 91 day device clinic remote transmission2/17/2022.   EP/Cardiology Office Visits: 07/25/2020 with Dr Lovena Le.  Copy of ICM check sent to Dr.Taylor.  3 month ICM trend: 07/01/2020.    1 Year ICM trend:       [name], RN 07/05/2020 1:44 PM

## 2020-07-08 ENCOUNTER — Other Ambulatory Visit (HOSPITAL_COMMUNITY): Payer: Self-pay | Admitting: Internal Medicine

## 2020-07-08 ENCOUNTER — Other Ambulatory Visit: Payer: Self-pay | Admitting: Internal Medicine

## 2020-07-09 ENCOUNTER — Other Ambulatory Visit (HOSPITAL_COMMUNITY): Payer: Self-pay

## 2020-07-09 NOTE — Progress Notes (Signed)
Paramedicine Encounter    Patient ID: Rhonda [name], female    DOB: 04-11-1951, 70 y.o.   MRN: [id number]   Patient Care Team: Maurice Small, MD as PCP - General (Family Medicine) [name], LCSW as Social Worker (Licensed Clinical Social Worker) [name], [name], DO as Consulting Physician (Neurology)  Patient Active Problem List   Diagnosis Date Noted  . Abnormal gait 06/11/2020  . Adult failure to thrive syndrome 06/11/2020  . Allergic rhinitis 06/11/2020  . Anxiety 06/11/2020  . Asthma 06/11/2020  . Benign intracranial hypertension 06/11/2020  . Body mass index (BMI) 45.0-49.9, adult ([place]) 06/11/2020  . Bowel incontinence 06/11/2020  . Cholelithiasis 06/11/2020  . Chronic sinusitis 06/11/2020  . 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  . Daytime somnolence 06/11/2020  . Diarrhea of presumed infectious origin 06/11/2020  . Edema 06/11/2020  . Family history of malignant neoplasm of gastrointestinal tract 06/11/2020  . Gout 06/11/2020  . History of fall 06/11/2020  . History of infectious disease 06/11/2020  . Insomnia 06/11/2020  . Irregular bowel habits 06/11/2020  . 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 Lumbar spondylosis with myelopathy 06/11/2020  . Macrocytosis 06/11/2020  . Mild recurrent major depression ([place]) 06/11/2020  . Personal history of colonic polyps 06/11/2020  . Personal history of malignant neoplasm of breast 06/11/2020  . Recurrent falls 06/11/2020  . Mixed anxiety and depressive disorder 06/11/2020  . Irritable bowel syndrome 01/04/2020  . Spinal stenosis of lumbar region 01/03/2020  . Diabetes mellitus with neuropathy ([place]) 06/15/2019  . Hypokalemia 11/08/2018  . CKD (chronic kidney disease) stage 3, GFR 30-59 ml/min (HCC) 11/08/2018  . Orthostatic hypotension 07/28/2017  . SVD (spontaneous vaginal delivery)   . Peripheral neuropathy   . On home oxygen therapy   . Migraines   . Left bundle branch block   . Hypothyroidism   . Hypertension   . Hyperlipidemia    . Heart murmur   . GERD (gastroesophageal reflux disease)   . Exertional shortness of breath   . Major depressive disorder   . Back pain   . Arthritis   . Generalized anxiety disorder   . Anemia   . Chronic respiratory failure ([place]) 09/14/2013  . 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 Morbid obesity ([place]) 11/01/2012  . Chronic systolic heart failure ([place]) 10/27/2012  . Endometrial polyp 01/20/2012  . Malignant tumor of breast ([place]) 03/26/2011  . Unspecified vitamin D deficiency 03/26/2011    Current Outpatient Medications:  .  acetaZOLAMIDE (DIAMOX) 125 MG tablet, TAKE 1 TABLET(125 MG) BY MOUTH DAILY, Disp: 90 tablet, Rfl: 3 .  carvedilol (COREG) 3.125 MG tablet, TAKE 1 TABLET (3.125 MG) BY MOUTH TWICE DAILY WITH MEALS, Disp: 180 tablet, Rfl: 3 .  citalopram (CELEXA) 20 MG tablet, Take 20 mg by mouth daily., Disp: , Rfl:  .  dicyclomine (BENTYL) 20 MG tablet, Take 20 mg by mouth every 6 (six) hours., Disp: , Rfl:  .  DULoxetine (CYMBALTA) 30 MG capsule, Take 30 mg by mouth daily. Take once in the morning., Disp: , Rfl:  .  fexofenadine (ALLEGRA) 180 MG tablet, Take 180 mg by mouth 2 (two) times daily. (Patient not taking: No sig reported), Disp: , Rfl:  .  fludrocortisone (FLORINEF) 0.1 MG tablet, TAKE 1 TABLET(0.1 MG) BY MOUTH TWICE DAILY, Disp: 60 tablet, Rfl: 3 .  gabapentin (NEURONTIN) 300 MG capsule, Take 300 mg by mouth 3 (three) times daily., Disp: , Rfl:  .  gabapentin (NEURONTIN) 800 MG tablet, TAKE 1  TABLET AT BEDTIME AS NEEDED FOR NEUROPATHY, Disp: 90 tablet, Rfl: 0 .  Ginkgo Biloba 120 MG TABS, Take by mouth., Disp: , Rfl:  .  hydrOXYzine (ATARAX/VISTARIL) 25 MG tablet, Take 25 mg by mouth 2 (two) times daily. Takes Morning and Bedtime (Patient not taking: Reported on 07/02/2020), Disp: , Rfl:  .  hydrOXYzine (VISTARIL) 25 MG capsule, Take 25 mg by mouth 3 (three) times daily as needed.  (Patient not taking: Reported on 07/02/2020),  Disp: , Rfl:  .  ipratropium (ATROVENT) 0.03 % nasal spray, Place 2 sprays into both nostrils 2 (two) times daily., Disp: , Rfl:  .  levothyroxine (SYNTHROID) 88 MCG tablet, TAKE 1 TABLET(88 MCG) BY MOUTH DAILY BEFORE BREAKFAST, Disp: 90 tablet, Rfl: 3 .  metolazone (ZAROXOLYN) 2.5 MG tablet, Take 1 tablet by mouth every Tuesday, Disp: 12 tablet, Rfl: 3 .  midodrine (PROAMATINE) 10 MG tablet, TAKE 1 TABLET (10 MG TOTAL) BY MOUTH 3 (THREE) TIMES DAILY WITH MEALS., Disp: 270 tablet, Rfl: 3 .  mirtazapine (REMERON) 30 MG tablet, TAKE 1 TABLET BY MOUTH EVERY NIGHT AT BEDTIME, Disp: 30 tablet, Rfl: 3 .  montelukast (SINGULAIR) 10 MG tablet, Take 10 mg by mouth at bedtime. , Disp: , Rfl: 1 .  omeprazole (PRILOSEC) 40 MG capsule, Take 40 mg by mouth daily., Disp: , Rfl:  .  potassium chloride SA (KLOR-CON) 20 MEQ tablet, TAKE 2 TABLETS THREE TIMES DAILY. TAKE AN EXTRA 2 TABLETS ON DAYS YOU TAKE METOLAZONE (Patient taking differently: Take 20 mEq by mouth 3 (three) times daily. Take 3 tablets three times daily. Take an extra 2 tablets on days you take Metolazone), Disp: 572 tablet, Rfl: 0 .  probenecid (BENEMID) 500 MG tablet, Take 500 mg by mouth 2 (two) times daily., Disp: , Rfl:  .  simvastatin (ZOCOR) 10 MG tablet, Take 10 mg by mouth every evening., Disp: , Rfl:  .  spironolactone (ALDACTONE) 25 MG tablet, Take 1 tablet (25 mg total) by mouth every evening., Disp: 90 tablet, Rfl: 3 .  torsemide (DEMADEX) 20 MG tablet, Take 3 tablets (60 mg total) by mouth 2 (two) times daily., Disp: 540 tablet, Rfl: 3 .  traMADol (ULTRAM) 50 MG tablet, Take by mouth every 6 (six) hours as needed., Disp: , Rfl:  Allergies  Allergen Reactions  . Ceftin Anaphylaxis    Face and throat swell   . Geodon [Ziprasidone Hcl] Hives  . Lisinopril Other (See Comments)    angioedema  . Shellfish Allergy Other (See Comments)    Gout exacerbation  . Allopurinol Nausea Only and Other (See Comments)    weakness  . Ativan  [Lorazepam] Itching  . Sulfa Antibiotics Itching  . Ultram [Tramadol Hcl] Itching  . Valium [Diazepam] Other (See Comments)    Patient states that diazepam doesn't relax, it has the opposite effect.     Social History   Socioeconomic History  . Marital status: Married    Spouse name: Not on file  . Number of children: 2  . Years of education: 48  . Highest education level: Master's degree (e.g., MA, MS, MEng, MEd, MSW, MBA)  Occupational History  . Occupation: retired  Tobacco Use  . Smoking status: Current Some Day Smoker    Packs/day: 0.10    Years: 26.00    Pack years: 2.60    Types: Cigarettes    Last attempt to quit: 05/06/2007    Years since quitting: 13.1  . Smokeless tobacco: Never Used  Vaping Use  .  Vaping Use: Never used  Substance and Sexual Activity  . Alcohol use: No  . Drug use: No  . Sexual activity: Yes  Other Topics Concern  . 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  Constitutional:      Appearance: Normal appearance. She is normal weight.  HENT:     Head: Normocephalic.     Nose: Nose normal.     Mouth/Throat:     Mouth: Mucous membranes are moist.     Pharynx: Oropharynx is clear.  Eyes:     Pupils: Pupils are equal, round, and reactive to light.  Cardiovascular:     Rate and Rhythm: Normal rate and regular rhythm.     Pulses:  Normal pulses.     Heart sounds: Normal heart sounds.  Pulmonary:     Effort: Pulmonary effort is normal.     Breath sounds: Normal breath sounds.  Abdominal:     General: Abdomen is flat.     Palpations: Abdomen is soft.  Musculoskeletal:        General: Swelling present. Normal range of motion.     Cervical back: Normal range of motion.     Right lower leg: Edema present.     Left lower leg: Edema present.  Skin:    General: Skin is warm and dry.     Capillary Refill: Capillary refill takes less than 2 seconds.  Neurological:     General: No focal deficit present.     Mental Status: She is alert. Mental status is at baseline.  Psychiatric:        Mood and Affect: Mood normal.     Arrived for home visit for Yan who was alert and oriented reporting to be feeling "okay" today. Verita was compliant with medications over the last week. [name] and I reviewed appointments, medications and confirmed all. Pill box filled accordingly. Avigail had some swelling in her legs with some pain but no more than her normal. Vitals were obtained and are as noted. Myrla's weight up one lb this week. [name] agreed with visit in one week. Home visit complete.   Refills: NONE  OTC- [place]    Future Appointments  Date Time Provider [place]  07/11/2020  7:40 AM [facility] DEVICE REMOTES [facility]  07/18/2020  2:30 PM Kirsteins, Luanna Salk, MD [facility] CPR  07/25/2020  2:00 PM Evans Lance, MD [facility]  08/05/2020  7:05 AM [facility] DEVICE REMOTES [facility]  10/10/2020  7:40 AM [facility] DEVICE REMOTES [facility]  01/09/2021  7:40 AM [facility] DEVICE REMOTES [facility]  02/27/2021  9:50 AM [name], Arvin Collard K, DO [facility] None  04/10/2021  7:40 AM [facility] DEVICE REMOTES [facility]  07/10/2021  7:40 AM [facility] DEVICE  REMOTES [facility]  08/11/2021  7:40 AM  [facility] DEVICE REMOTES [facility]  09/10/2021  7:40 AM [facility] DEVICE REMOTES [facility]     ACTION: Home visit completed Next visit planned for ONE WEEK

## 2020-07-11 ENCOUNTER — Ambulatory Visit (INDEPENDENT_AMBULATORY_CARE_PROVIDER_SITE_OTHER): Payer: Medicare HMO

## 2020-07-11 DIAGNOSIS — I428 Other cardiomyopathies: Secondary | ICD-10-CM | POA: Diagnosis not present

## 2020-07-11 LAB — CUP PACEART REMOTE DEVICE CHECK
Battery Remaining Longevity: 93 mo
Battery Voltage: 3.02 V
Brady Statistic AP VP Percent: 0.04 %
Brady Statistic AP VS Percent: 0.01 %
Brady Statistic AS VP Percent: 98.83 %
Brady Statistic AS VS Percent: 1.13 %
Brady Statistic RA Percent Paced: 0.05 %
Brady Statistic RV Percent Paced: 64.11 %
Date Time Interrogation Session: 20220217022824
HighPow Impedance: 55 Ohm
Implantable Lead Implant Date: 20141210
Implantable Lead Implant Date: 20141210
Implantable Lead Implant Date: 20141210
Implantable Lead Location: 753858
Implantable Lead Location: 753859
Implantable Lead Location: 753860
Implantable Lead Model: 4396
Implantable Lead Model: 5076
Implantable Lead Model: 6935
Implantable Pulse Generator Implant Date: 20211118
Lead Channel Impedance Value: 361 Ohm
Lead Channel Impedance Value: 361 Ohm
Lead Channel Impedance Value: 418 Ohm
Lead Channel Impedance Value: 513 Ohm
Lead Channel Impedance Value: 589 Ohm
Lead Channel Impedance Value: 608 Ohm
Lead Channel Pacing Threshold Amplitude: 0.5 V
Lead Channel Pacing Threshold Amplitude: 0.875 V
Lead Channel Pacing Threshold Amplitude: 1.125 V
Lead Channel Pacing Threshold Pulse Width: 0.4 ms
Lead Channel Pacing Threshold Pulse Width: 0.4 ms
Lead Channel Pacing Threshold Pulse Width: 0.4 ms
Lead Channel Sensing Intrinsic Amplitude: 2.875 mV
Lead Channel Sensing Intrinsic Amplitude: 2.875 mV
Lead Channel Sensing Intrinsic Amplitude: 23.625 mV
Lead Channel Sensing Intrinsic Amplitude: 23.625 mV
Lead Channel Setting Pacing Amplitude: 1.5 V
Lead Channel Setting Pacing Amplitude: 2 V
Lead Channel Setting Pacing Amplitude: 2.25 V
Lead Channel Setting Pacing Pulse Width: 0.4 ms
Lead Channel Setting Pacing Pulse Width: 0.4 ms
Lead Channel Setting Sensing Sensitivity: 0.3 mV

## 2020-07-15 ENCOUNTER — Telehealth (HOSPITAL_COMMUNITY): Payer: Self-pay

## 2020-07-15 NOTE — Telephone Encounter (Signed)
Spoke to Rhonda [name] who reports she can manage her medications until I return back to work with the assistance of her husband and her daughter. She understood to call clinic if needed. Call complete.

## 2020-07-18 ENCOUNTER — Encounter: Payer: Medicare HMO | Admitting: Physical Medicine & Rehabilitation

## 2020-07-18 NOTE — Progress Notes (Signed)
Remote ICD transmission.   

## 2020-07-23 ENCOUNTER — Other Ambulatory Visit (HOSPITAL_COMMUNITY): Payer: Self-pay

## 2020-07-23 DIAGNOSIS — F32A Depression, unspecified: Secondary | ICD-10-CM | POA: Diagnosis not present

## 2020-07-23 DIAGNOSIS — I5022 Chronic systolic (congestive) heart failure: Secondary | ICD-10-CM | POA: Diagnosis not present

## 2020-07-23 DIAGNOSIS — G4700 Insomnia, unspecified: Secondary | ICD-10-CM | POA: Diagnosis not present

## 2020-07-23 DIAGNOSIS — N183 Chronic kidney disease, stage 3 unspecified: Secondary | ICD-10-CM | POA: Diagnosis not present

## 2020-07-23 DIAGNOSIS — E1140 Type 2 diabetes mellitus with diabetic neuropathy, unspecified: Secondary | ICD-10-CM | POA: Diagnosis not present

## 2020-07-23 DIAGNOSIS — J449 Chronic obstructive pulmonary disease, unspecified: Secondary | ICD-10-CM | POA: Diagnosis not present

## 2020-07-23 DIAGNOSIS — R519 Headache, unspecified: Secondary | ICD-10-CM | POA: Diagnosis not present

## 2020-07-23 DIAGNOSIS — M48061 Spinal stenosis, lumbar region without neurogenic claudication: Secondary | ICD-10-CM | POA: Diagnosis not present

## 2020-07-23 DIAGNOSIS — I130 Hypertensive heart and chronic kidney disease with heart failure and stage 1 through stage 4 chronic kidney disease, or unspecified chronic kidney disease: Secondary | ICD-10-CM | POA: Diagnosis not present

## 2020-07-23 NOTE — Progress Notes (Signed)
Paramedicine Encounter    Patient ID: Rhonda [name], female    DOB: 22-May-1951, 70 y.o.   MRN: [id number]   Patient Care Team: Maurice Small, MD as PCP - General (Family Medicine) [name], LCSW as Social Worker (Licensed Clinical Social Worker) [name], [name], DO as Consulting Physician (Neurology)  Patient Active Problem List   Diagnosis Date Noted  . Abnormal gait 06/11/2020  . Adult failure to thrive syndrome 06/11/2020  . Allergic rhinitis 06/11/2020  . Anxiety 06/11/2020  . Asthma 06/11/2020  . Benign intracranial hypertension 06/11/2020  . Body mass index (BMI) 45.0-49.9, adult ([place]) 06/11/2020  . Bowel incontinence 06/11/2020  . Cholelithiasis 06/11/2020  . Chronic sinusitis 06/11/2020  . 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  . Daytime somnolence 06/11/2020  . Diarrhea of presumed infectious origin 06/11/2020  . Edema 06/11/2020  . Family history of malignant neoplasm of gastrointestinal tract 06/11/2020  . Gout 06/11/2020  . History of fall 06/11/2020  . History of infectious disease 06/11/2020  . Insomnia 06/11/2020  . Irregular bowel habits 06/11/2020  . 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 Lumbar spondylosis with myelopathy 06/11/2020  . Macrocytosis 06/11/2020  . Mild recurrent major depression ([place]) 06/11/2020  . Personal history of colonic polyps 06/11/2020  . Personal history of malignant neoplasm of breast 06/11/2020  . Recurrent falls 06/11/2020  . Mixed anxiety and depressive disorder 06/11/2020  . Irritable bowel syndrome 01/04/2020  . Spinal stenosis of lumbar region 01/03/2020  . Diabetes mellitus with neuropathy ([place]) 06/15/2019  . Hypokalemia 11/08/2018  . CKD (chronic kidney disease) stage 3, GFR 30-59 ml/min (HCC) 11/08/2018  . Orthostatic hypotension 07/28/2017  . SVD (spontaneous vaginal delivery)   . Peripheral neuropathy   . On home oxygen therapy   . Migraines   . Left bundle branch block   . Hypothyroidism   . Hypertension   . Hyperlipidemia    . Heart murmur   . GERD (gastroesophageal reflux disease)   . Exertional shortness of breath   . Major depressive disorder   . Back pain   . Arthritis   . Generalized anxiety disorder   . Anemia   . Chronic respiratory failure ([place]) 09/14/2013  . 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 Morbid obesity ([place]) 11/01/2012  . Chronic systolic heart failure ([place]) 10/27/2012  . Endometrial polyp 01/20/2012  . Malignant tumor of breast ([place]) 03/26/2011  . Unspecified vitamin D deficiency 03/26/2011    Current Outpatient Medications:  .  acetaZOLAMIDE (DIAMOX) 125 MG tablet, TAKE 1 TABLET(125 MG) BY MOUTH DAILY, Disp: 90 tablet, Rfl: 3 .  carvedilol (COREG) 3.125 MG tablet, TAKE 1 TABLET (3.125 MG) BY MOUTH TWICE DAILY WITH MEALS, Disp: 180 tablet, Rfl: 3 .  citalopram (CELEXA) 20 MG tablet, Take 20 mg by mouth daily., Disp: , Rfl:  .  dicyclomine (BENTYL) 20 MG tablet, Take 20 mg by mouth every 6 (six) hours., Disp: , Rfl:  .  DULoxetine (CYMBALTA) 30 MG capsule, Take 30 mg by mouth daily. Take once in the morning., Disp: , Rfl:  .  fexofenadine (ALLEGRA) 180 MG tablet, Take 180 mg by mouth 2 (two) times daily. (Patient not taking: No sig reported), Disp: , Rfl:  .  fludrocortisone (FLORINEF) 0.1 MG tablet, TAKE 1 TABLET(0.1 MG) BY MOUTH TWICE DAILY, Disp: 60 tablet, Rfl: 3 .  gabapentin (NEURONTIN) 300 MG capsule, Take 300 mg by mouth 3 (three) times daily., Disp: , Rfl:  .  gabapentin (NEURONTIN) 800 MG tablet, TAKE 1  TABLET AT BEDTIME AS NEEDED FOR NEUROPATHY, Disp: 90 tablet, Rfl: 0 .  Ginkgo Biloba 120 MG TABS, Take by mouth., Disp: , Rfl:  .  hydrOXYzine (ATARAX/VISTARIL) 25 MG tablet, Take 25 mg by mouth 2 (two) times daily. Takes Morning and Bedtime (Patient not taking: No sig reported), Disp: , Rfl:  .  hydrOXYzine (VISTARIL) 25 MG capsule, Take 25 mg by mouth 3 (three) times daily as needed.  (Patient not taking: No sig reported), Disp: , Rfl:   .  ipratropium (ATROVENT) 0.03 % nasal spray, Place 2 sprays into both nostrils 2 (two) times daily., Disp: , Rfl:  .  levothyroxine (SYNTHROID) 88 MCG tablet, TAKE 1 TABLET(88 MCG) BY MOUTH DAILY BEFORE BREAKFAST, Disp: 90 tablet, Rfl: 3 .  metolazone (ZAROXOLYN) 2.5 MG tablet, Take 1 tablet by mouth every Tuesday, Disp: 12 tablet, Rfl: 3 .  midodrine (PROAMATINE) 10 MG tablet, TAKE 1 TABLET (10 MG TOTAL) BY MOUTH 3 (THREE) TIMES DAILY WITH MEALS., Disp: 270 tablet, Rfl: 3 .  mirtazapine (REMERON) 30 MG tablet, TAKE 1 TABLET BY MOUTH EVERY NIGHT AT BEDTIME, Disp: 30 tablet, Rfl: 3 .  montelukast (SINGULAIR) 10 MG tablet, Take 10 mg by mouth at bedtime. , Disp: , Rfl: 1 .  omeprazole (PRILOSEC) 40 MG capsule, Take 40 mg by mouth daily., Disp: , Rfl:  .  potassium chloride SA (KLOR-CON) 20 MEQ tablet, TAKE 2 TABLETS THREE TIMES DAILY. TAKE AN EXTRA 2 TABLETS ON DAYS YOU TAKE METOLAZONE (Patient taking differently: Take 20 mEq by mouth 3 (three) times daily. Take 3 tablets three times daily. Take an extra 2 tablets on days you take Metolazone), Disp: 572 tablet, Rfl: 0 .  probenecid (BENEMID) 500 MG tablet, Take 500 mg by mouth 2 (two) times daily., Disp: , Rfl:  .  simvastatin (ZOCOR) 10 MG tablet, Take 10 mg by mouth every evening., Disp: , Rfl:  .  spironolactone (ALDACTONE) 25 MG tablet, Take 1 tablet (25 mg total) by mouth every evening., Disp: 90 tablet, Rfl: 3 .  torsemide (DEMADEX) 20 MG tablet, Take 3 tablets (60 mg total) by mouth 2 (two) times daily., Disp: 540 tablet, Rfl: 3 .  traMADol (ULTRAM) 50 MG tablet, Take by mouth every 6 (six) hours as needed. (Patient not taking: Reported on 07/09/2020), Disp: , Rfl:  Allergies  Allergen Reactions  . Ceftin Anaphylaxis    Face and throat swell   . Geodon [Ziprasidone Hcl] Hives  . Lisinopril Other (See Comments)    angioedema  . Shellfish Allergy Other (See Comments)    Gout exacerbation  . Allopurinol Nausea Only and Other (See Comments)     weakness  . Ativan [Lorazepam] Itching  . Sulfa Antibiotics Itching  . Ultram [Tramadol Hcl] Itching  . Valium [Diazepam] Other (See Comments)    Patient states that diazepam doesn't relax, it has the opposite effect.     Social History   Socioeconomic History  . Marital status: Married    Spouse name: Not on file  . Number of children: 2  . Years of education: 40  . Highest education level: Master's degree (e.g., MA, MS, MEng, MEd, MSW, MBA)  Occupational History  . Occupation: retired  Tobacco Use  . Smoking status: Current Some Day Smoker    Packs/day: 0.10    Years: 26.00    Pack years: 2.60    Types: Cigarettes    Last attempt to quit: 05/06/2007    Years since quitting: 13.2  . Smokeless  tobacco: Never Used  Vaping Use  . Vaping Use: Never used  Substance and Sexual Activity  . Alcohol use: No  . Drug use: No  . Sexual activity: Yes  Other Topics Concern  . 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  Constitutional:      Appearance: She is normal weight.  HENT:     Head: Normocephalic.     Nose: Rhinorrhea present.     Mouth/Throat:     Mouth: Mucous membranes are moist.     Pharynx: Oropharynx is clear.  Eyes:     Extraocular Movements: Extraocular movements intact.     Conjunctiva/sclera: Conjunctivae normal.     Pupils: Pupils are equal,  round, and reactive to light.  Cardiovascular:     Rate and Rhythm: Normal rate and regular rhythm.     Pulses: Normal pulses.     Heart sounds: Normal heart sounds.  Pulmonary:     Effort: Pulmonary effort is normal.     Breath sounds: Normal breath sounds.  Abdominal:     General: Abdomen is flat.     Palpations: Abdomen is soft.  Musculoskeletal:        General: Swelling present. Normal range of motion.     Cervical back: Normal range of motion.     Right lower leg: Edema present.     Left lower leg: Edema present.  Skin:    General: Skin is warm and dry.     Capillary Refill: Capillary refill takes less than 2 seconds.  Neurological:     General: No focal deficit present.     Mental Status: She is alert. Mental status is at baseline.  Psychiatric:        Mood and Affect: Mood normal.        Thought Content: Thought content normal.        Judgment: Judgment normal.    Arrived for home visit for [facility] who was alert and oriented with complaints of her chronic lower back pain. Elynor reports she wants to seek a second opinion for her spinal doctor, I encouraged her to speak to her insurance to ensure she gets one that's in network. She agreed with plan. I obtained vitals, weight was up 9lbs since our visit two weeks ago. Anjel reports she has not had an appetite and hasn't been eating enough meals. I encouraged her to monitor her weight closely this week and to be sure she is eating properly. She agreed with plan. I reviewed medications and confirmed same. Pill box filled accordingly. [name] reminded of upcoming appointment with Dr. Kathrynn Running agreed with plan. Home visit complete. I will see Mrs. Dunstan in one week.   Refills- NONE      Future Appointments  Date Time Provider [place]  07/25/2020  2:00 PM Evans Lance, MD [facility]  08/05/2020  7:05 AM [facility] DEVICE REMOTES [facility]  08/08/2020  1:00 PM Kirsteins, Luanna Salk,  MD [facility] CPR  10/10/2020  7:40 AM [facility] DEVICE REMOTES [facility]  01/09/2021  7:40 AM [facility] DEVICE REMOTES [facility]  02/27/2021  9:50 AM Narda Amber K, DO [facility] None  04/10/2021  7:40 AM [facility] DEVICE REMOTES [facility]  07/10/2021  7:40 AM [facility] DEVICE REMOTES [facility]  08/11/2021  7:40 AM [facility] DEVICE REMOTES [facility]  09/10/2021  7:40 AM [facility] DEVICE REMOTES [facility]     ACTION: Home visit completed Next visit planned for one week

## 2020-07-24 DIAGNOSIS — M48061 Spinal stenosis, lumbar region without neurogenic claudication: Secondary | ICD-10-CM | POA: Diagnosis not present

## 2020-07-24 DIAGNOSIS — E039 Hypothyroidism, unspecified: Secondary | ICD-10-CM | POA: Diagnosis not present

## 2020-07-24 DIAGNOSIS — F330 Major depressive disorder, recurrent, mild: Secondary | ICD-10-CM | POA: Diagnosis not present

## 2020-07-24 DIAGNOSIS — J449 Chronic obstructive pulmonary disease, unspecified: Secondary | ICD-10-CM | POA: Diagnosis not present

## 2020-07-24 DIAGNOSIS — I110 Hypertensive heart disease with heart failure: Secondary | ICD-10-CM | POA: Diagnosis not present

## 2020-07-24 DIAGNOSIS — I5022 Chronic systolic (congestive) heart failure: Secondary | ICD-10-CM | POA: Diagnosis not present

## 2020-07-24 DIAGNOSIS — E1140 Type 2 diabetes mellitus with diabetic neuropathy, unspecified: Secondary | ICD-10-CM | POA: Diagnosis not present

## 2020-07-24 DIAGNOSIS — I10 Essential (primary) hypertension: Secondary | ICD-10-CM | POA: Diagnosis not present

## 2020-07-25 ENCOUNTER — Ambulatory Visit: Payer: Medicare HMO | Admitting: Internal Medicine

## 2020-07-25 ENCOUNTER — Encounter: Payer: Self-pay | Admitting: Internal Medicine

## 2020-07-25 ENCOUNTER — Other Ambulatory Visit: Payer: Self-pay

## 2020-07-25 VITALS — BP 122/74 | HR 87 | Ht 60.0 in | Wt 206.6 lb

## 2020-07-25 DIAGNOSIS — I447 Left bundle-branch block, unspecified: Secondary | ICD-10-CM

## 2020-07-25 DIAGNOSIS — I5022 Chronic systolic (congestive) heart failure: Secondary | ICD-10-CM

## 2020-07-25 DIAGNOSIS — Z95810 Presence of automatic (implantable) cardiac defibrillator: Secondary | ICD-10-CM

## 2020-07-25 NOTE — Progress Notes (Signed)
HPI Rhonda [name] returns today for ongoing evaluation and management of chronic systolic heart failure status post ICD insertion.  She is a very pleasant 70 year old woman with a long history of congestive heart failure, status post biventricular ICD insertion.  She has class II heart failure symptoms.  She has morbid obesity and has been unable to lose weight.  Unfortunately she has gained over 20 pounds in the last 2 months.  She has had no ICD therapies.  She admits to some dietary indiscretion. She was eating a whole bag at a time of 3 cheese potato chips.  Allergies  Allergen Reactions  . Ceftin Anaphylaxis    Face and throat swell   . Geodon [Ziprasidone Hcl] Hives  . Lisinopril Other (See Comments)    angioedema  . Shellfish Allergy Other (See Comments)    Gout exacerbation  . Allopurinol Nausea Only and Other (See Comments)    weakness  . Ativan [Lorazepam] Itching  . Sulfa Antibiotics Itching  . Ultram [Tramadol Hcl] Itching  . Valium [Diazepam] Other (See Comments)    Patient states that diazepam doesn't relax, it has the opposite effect.     Current Outpatient Medications  Medication Sig Dispense Refill  . acetaZOLAMIDE (DIAMOX) 125 MG tablet TAKE 1 TABLET(125 MG) BY MOUTH DAILY 90 tablet 3  . carvedilol (COREG) 3.125 MG tablet TAKE 1 TABLET (3.125 MG) BY MOUTH TWICE DAILY WITH MEALS 180 tablet 3  . citalopram (CELEXA) 20 MG tablet Take 20 mg by mouth daily.    Marland Kitchen dicyclomine (BENTYL) 20 MG tablet Take 20 mg by mouth every 6 (six) hours.    . DULoxetine (CYMBALTA) 30 MG capsule Take 30 mg by mouth daily. Take once in the morning.    . fexofenadine (ALLEGRA) 180 MG tablet Take 180 mg by mouth 2 (two) times daily. (Patient not taking: No sig reported)    . fludrocortisone (FLORINEF) 0.1 MG tablet TAKE 1 TABLET(0.1 MG) BY MOUTH TWICE DAILY 60 tablet 3  . gabapentin (NEURONTIN) 300 MG capsule Take 300 mg by mouth 3 (three) times daily.    Marland Kitchen gabapentin (NEURONTIN) 800 MG  tablet TAKE 1 TABLET AT BEDTIME AS NEEDED FOR NEUROPATHY 90 tablet 0  . Ginkgo Biloba 120 MG TABS Take by mouth.    . hydrOXYzine (ATARAX/VISTARIL) 25 MG tablet Take 25 mg by mouth 2 (two) times daily. Takes Morning and Bedtime (Patient not taking: No sig reported)    . hydrOXYzine (VISTARIL) 25 MG capsule Take 25 mg by mouth 3 (three) times daily as needed.  (Patient not taking: No sig reported)    . ipratropium (ATROVENT) 0.03 % nasal spray Place 2 sprays into both nostrils 2 (two) times daily.    Marland Kitchen levothyroxine (SYNTHROID) 88 MCG tablet TAKE 1 TABLET(88 MCG) BY MOUTH DAILY BEFORE BREAKFAST 90 tablet 3  . metolazone (ZAROXOLYN) 2.5 MG tablet Take 1 tablet by mouth every Tuesday 12 tablet 3  . midodrine (PROAMATINE) 10 MG tablet TAKE 1 TABLET (10 MG TOTAL) BY MOUTH 3 (THREE) TIMES DAILY WITH MEALS. 270 tablet 3  . mirtazapine (REMERON) 30 MG tablet TAKE 1 TABLET BY MOUTH EVERY NIGHT AT BEDTIME 30 tablet 3  . montelukast (SINGULAIR) 10 MG tablet Take 10 mg by mouth at bedtime.   1  . omeprazole (PRILOSEC) 40 MG capsule Take 40 mg by mouth daily.    . potassium chloride SA (KLOR-CON) 20 MEQ tablet TAKE 2 TABLETS THREE TIMES DAILY. TAKE AN EXTRA 2  TABLETS ON DAYS YOU TAKE METOLAZONE (Patient taking differently: Take 20 mEq by mouth 3 (three) times daily. Take 3 tablets three times daily. Take an extra 2 tablets on days you take Metolazone) 572 tablet 0  . probenecid (BENEMID) 500 MG tablet Take 500 mg by mouth 2 (two) times daily.    . simvastatin (ZOCOR) 10 MG tablet Take 10 mg by mouth every evening.    Marland Kitchen spironolactone (ALDACTONE) 25 MG tablet Take 1 tablet (25 mg total) by mouth every evening. 90 tablet 3  . torsemide (DEMADEX) 20 MG tablet Take 3 tablets (60 mg total) by mouth 2 (two) times daily. 540 tablet 3  . traMADol (ULTRAM) 50 MG tablet Take by mouth every 6 (six) hours as needed. (Patient not taking: No sig reported)     No current facility-administered medications for this visit.      Past Medical History:  Diagnosis Date  . Anemia   . Arthritis    Right knee  . Asthma   . Back pain    Disk problem  . Breast cancer, stage 1 ([place]) 03/26/2011   Left; completed chemotherapy and radiation treatments  . Cardiomyopathy   . Chronic respiratory failure ([place]) 09/14/2013  . Chronic systolic heart failure (H[facility])    a) NICM b) ECHO (03/2013) EF 20-25% c) ECHO (09/2013) EF 45-50%, grade I DD  . CKD (chronic kidney disease) stage 3, GFR 30-59 ml/min ([place]) 11/08/2018  . Complication of anesthesia    History of low blood pressure after surgery; attributed to lying flat  . Diabetes mellitus    "diet controlled" (05/03/2013)  . Exertional shortness of breath   . Generalized anxiety disorder   . GERD (gastroesophageal reflux disease)   . Gout   . Heart murmur   . Hyperlipidemia   . Hypertension   . Hypokalemia 11/08/2018  . Hypothyroidism   . Left bundle branch block    s/p CRT-D (04/2013)  . Major depressive disorder   . Migraines   . On home oxygen therapy    "2L suppose to be q night" (05/03/2013)  . Orthostatic hypotension 07/28/2017  . Peripheral neuropathy    Feet  . SVD (spontaneous vaginal delivery)    x 2  . Unspecified vitamin D deficiency 03/26/2011   Does not take meds    ROS:   All systems reviewed and negative except as noted in the HPI.   Past Surgical History:  Procedure Laterality Date  . BI-VENTRICULAR IMPLANTABLE CARDIOVERTER DEFIBRILLATOR N/A 05/03/2013   Procedure: BI-VENTRICULAR IMPLANTABLE CARDIOVERTER DEFIBRILLATOR  (CRT-D);  Surgeon: Evans Lance, MD;  Location: [facility] CATH LAB;  Service: Cardiovascular;  Laterality: N/A;  . BI-VENTRICULAR IMPLANTABLE CARDIOVERTER DEFIBRILLATOR  (CRT-D)  05/03/2013   MDT CRTD implanted by Dr Lovena Le for non ischemic cardiomyopathy  . BIOPSY  05/31/2018   Procedure: BIOPSY;  Surgeon: Ronnette Juniper, MD;  Location: [facility];  Service: Gastroenterology;;  . BIV ICD GENERATOR CHANGEOUT N/A 04/11/2020    Procedure: BIV ICD GENERATOR CHANGEOUT;  Surgeon: Evans Lance, MD;  Location: [facility] CV LAB;  Service: Cardiovascular;  Laterality: N/A;  . BREAST LUMPECTOMY Left 07/28/2010  . CARDIAC CATHETERIZATION  2010   NORMAL CORONARY ARTERIES  . 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--11 SURGERIES     PT HAS REMOVABLE SPEECH BULB-TAKES IT OUT BEFORE HER SURGERY  . COLONOSCOPY    . COLONOSCOPY N/A 05/31/2018   Procedure: COLONOSCOPY;  Surgeon: Ronnette Juniper, MD;  Location: [facility] ENDOSCOPY;  Service: Gastroenterology;  Laterality: N/A;  . HYSTEROSCOPY WITH D & C  01/20/2012   Procedure: DILATATION AND CURETTAGE /HYSTEROSCOPY;  Surgeon: Margarette Asal, MD;  Location: [facility];  Service: Gynecology;  Laterality: N/A;  with trueclear  . PORT-A-CATH REMOVAL  09/23/2011   Procedure: REMOVAL PORT-A-CATH;  Surgeon: Rolm Bookbinder, MD;  Location: [facility];  Service: General;  Laterality: N/A;  Port Removal  . PORTACATH PLACEMENT  2012  . TONSILLECTOMY  1960's  . TOTAL KNEE ARTHROPLASTY  05/10/2012   Procedure: TOTAL KNEE ARTHROPLASTY;  Surgeon: Mauri Pole, MD;  Location: [facility];  Service: Orthopedics;  Laterality: Right;  . WISDOM TOOTH EXTRACTION       Family History  Problem Relation Age of Onset  . Alzheimer's disease Mother   . CVA Mother   . Hypertension Mother   . Cancer - Colon Father        late 22  . Birth defects Paternal Uncle      Social History   Socioeconomic History  . Marital status: Married    Spouse name: Not on file  . Number of children: 2  . Years of education: 63  . Highest education level: Master's degree (e.g., MA, MS, MEng, MEd, MSW, MBA)  Occupational History  . Occupation: retired  Tobacco Use  . Smoking status: Current Some Day Smoker    Packs/day: 0.10    Years: 26.00    Pack years: 2.60    Types: Cigarettes    Last attempt to quit: 05/06/2007    Years since quitting: 13.2  . Smokeless tobacco: Never Used  Vaping Use  . Vaping Use: Never used  Substance and  Sexual Activity  . Alcohol use: No  . Drug use: No  . Sexual activity: Yes  Other Topics Concern  . 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  Physical Exam:  Well appearing NAD HEENT: Unremarkable Neck:  No JVD, no thyromegally Lymphatics:  No adenopathy Back:  No CVA tenderness Lungs:  Clear with no wheezes HEART:  Regular rate rhythm, no murmurs, no rubs, no clicks Abd:  soft, positive bowel sounds, no organomegally, no rebound, no guarding Ext:  2 plus pulses, 2+ edema, no cyanosis, no clubbing Skin:  No rashes no nodules Neuro:  CN II through XII intact, motor grossly intact  DEVICE  Normal device function.  See PaceArt for details.   Assess/Plan: 1.  Chronic systolic heart failure -she will continue her current medical therapy and maintain a low-sodium diet. 2.  ICD -Medtronic BiV ICD is working normally.  We will plan to recheck after her generator change out. 3.  Obesity -we discussed the importance of weight loss.  We discussed intermittent fasting. 4.  Venous insufficiency -her  symptoms are somewhat improved.  She is encouraged to maintain a low-sodium diet and to use support stockings. She is not following a low sodium diet.  Carleene Overlie Rhian Funari,MD

## 2020-07-25 NOTE — Patient Instructions (Signed)
Medication Instructions:  Your physician recommends that you continue on your current medications as directed. Please refer to the Current Medication list given to you today.  Labwork: None ordered.  Testing/Procedures: None ordered.  Follow-Up: Your physician wants you to follow-up in: one year with Cristopher Peru, MD or one of the following Advanced Practice Providers on your designated Care Team:    Chanetta Marshall, NP  Tommye Standard, PA-C  Legrand Como "Jonni Sanger" [place], [place]  Remote monitoring is used to monitor your ICD from home. This monitoring reduces the number of office visits required to check your device to one time per year. It allows [place] to keep an eye on the functioning of your device to ensure it is working properly. You are scheduled for a device check from home on 08/05/2020. You may send your transmission at any time that day. If you have a wireless device, the transmission will be sent automatically. After your physician reviews your transmission, you will receive a postcard with your next transmission date.  Any Other Special Instructions Will Be Listed Below (If Applicable).  If you need a refill on your cardiac medications before your next appointment, please call your pharmacy.

## 2020-07-29 DIAGNOSIS — I5022 Chronic systolic (congestive) heart failure: Secondary | ICD-10-CM | POA: Diagnosis not present

## 2020-07-29 DIAGNOSIS — J449 Chronic obstructive pulmonary disease, unspecified: Secondary | ICD-10-CM | POA: Diagnosis not present

## 2020-07-29 DIAGNOSIS — F32A Depression, unspecified: Secondary | ICD-10-CM | POA: Diagnosis not present

## 2020-07-29 DIAGNOSIS — R519 Headache, unspecified: Secondary | ICD-10-CM | POA: Diagnosis not present

## 2020-07-29 DIAGNOSIS — I130 Hypertensive heart and chronic kidney disease with heart failure and stage 1 through stage 4 chronic kidney disease, or unspecified chronic kidney disease: Secondary | ICD-10-CM | POA: Diagnosis not present

## 2020-07-29 DIAGNOSIS — E1140 Type 2 diabetes mellitus with diabetic neuropathy, unspecified: Secondary | ICD-10-CM | POA: Diagnosis not present

## 2020-07-29 DIAGNOSIS — G4700 Insomnia, unspecified: Secondary | ICD-10-CM | POA: Diagnosis not present

## 2020-07-29 DIAGNOSIS — N183 Chronic kidney disease, stage 3 unspecified: Secondary | ICD-10-CM | POA: Diagnosis not present

## 2020-07-29 DIAGNOSIS — M48061 Spinal stenosis, lumbar region without neurogenic claudication: Secondary | ICD-10-CM | POA: Diagnosis not present

## 2020-07-30 ENCOUNTER — Other Ambulatory Visit (HOSPITAL_COMMUNITY): Payer: Self-pay

## 2020-07-30 NOTE — Progress Notes (Signed)
Paramedicine Encounter    Patient ID: Rhonda [name], female    DOB: [birth year], 70 y.o.   MRN: [id number]   Completed a medication pill box refill for [name] today, could not complete visit due to patient having another engagement she needed to leave home for. I will be back in one week to see her for a full visit.    Refills:  NONE     ACTION: Home visit completed Next visit planned for one week

## 2020-07-31 ENCOUNTER — Other Ambulatory Visit: Payer: Self-pay | Admitting: Neurology

## 2020-08-05 ENCOUNTER — Ambulatory Visit (INDEPENDENT_AMBULATORY_CARE_PROVIDER_SITE_OTHER): Payer: Medicare HMO

## 2020-08-05 DIAGNOSIS — G4700 Insomnia, unspecified: Secondary | ICD-10-CM | POA: Diagnosis not present

## 2020-08-05 DIAGNOSIS — J449 Chronic obstructive pulmonary disease, unspecified: Secondary | ICD-10-CM | POA: Diagnosis not present

## 2020-08-05 DIAGNOSIS — I5022 Chronic systolic (congestive) heart failure: Secondary | ICD-10-CM | POA: Diagnosis not present

## 2020-08-05 DIAGNOSIS — E1140 Type 2 diabetes mellitus with diabetic neuropathy, unspecified: Secondary | ICD-10-CM | POA: Diagnosis not present

## 2020-08-05 DIAGNOSIS — I130 Hypertensive heart and chronic kidney disease with heart failure and stage 1 through stage 4 chronic kidney disease, or unspecified chronic kidney disease: Secondary | ICD-10-CM | POA: Diagnosis not present

## 2020-08-05 DIAGNOSIS — Z95810 Presence of automatic (implantable) cardiac defibrillator: Secondary | ICD-10-CM

## 2020-08-05 DIAGNOSIS — F32A Depression, unspecified: Secondary | ICD-10-CM | POA: Diagnosis not present

## 2020-08-05 DIAGNOSIS — N183 Chronic kidney disease, stage 3 unspecified: Secondary | ICD-10-CM | POA: Diagnosis not present

## 2020-08-05 DIAGNOSIS — M48061 Spinal stenosis, lumbar region without neurogenic claudication: Secondary | ICD-10-CM | POA: Diagnosis not present

## 2020-08-05 DIAGNOSIS — R519 Headache, unspecified: Secondary | ICD-10-CM | POA: Diagnosis not present

## 2020-08-06 ENCOUNTER — Other Ambulatory Visit (HOSPITAL_COMMUNITY): Payer: Self-pay

## 2020-08-06 NOTE — Progress Notes (Signed)
Paramedicine Encounter    Patient ID: Rhonda [name], female    DOB: [birth year], 70 y.o.   MRN: [id number]   Patient Care Team: Maurice Small, MD as PCP - General (Family Medicine) [name], LCSW as Social Worker (Licensed Clinical Social Worker) [name], [name], DO as Consulting Physician (Neurology)  Patient Active Problem List   Diagnosis Date Noted  . Abnormal gait 06/11/2020  . Adult failure to thrive syndrome 06/11/2020  . Allergic rhinitis 06/11/2020  . Anxiety 06/11/2020  . Asthma 06/11/2020  . Benign intracranial hypertension 06/11/2020  . Body mass index (BMI) 45.0-49.9, adult ([place]) 06/11/2020  . Bowel incontinence 06/11/2020  . Cholelithiasis 06/11/2020  . Chronic sinusitis 06/11/2020  . 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  . Daytime somnolence 06/11/2020  . Diarrhea of presumed infectious origin 06/11/2020  . Edema 06/11/2020  . Family history of malignant neoplasm of gastrointestinal tract 06/11/2020  . Gout 06/11/2020  . History of fall 06/11/2020  . History of infectious disease 06/11/2020  . Insomnia 06/11/2020  . Irregular bowel habits 06/11/2020  . 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 Lumbar spondylosis with myelopathy 06/11/2020  . Macrocytosis 06/11/2020  . Mild recurrent major depression ([place]) 06/11/2020  . Personal history of colonic polyps 06/11/2020  . Personal history of malignant neoplasm of breast 06/11/2020  . Recurrent falls 06/11/2020  . Mixed anxiety and depressive disorder 06/11/2020  . Irritable bowel syndrome 01/04/2020  . Spinal stenosis of lumbar region 01/03/2020  . Diabetes mellitus with neuropathy ([place]) 06/15/2019  . Hypokalemia 11/08/2018  . CKD (chronic kidney disease) stage 3, GFR 30-59 ml/min (H[facility]) 11/08/2018  . Orthostatic hypotension 07/28/2017  . SVD (spontaneous vaginal delivery)   . Peripheral neuropathy   . On home oxygen therapy   . Migraines   . Left bundle branch block   . Hypothyroidism   . Hypertension   . Hyperlipidemia    . Heart murmur   . GERD (gastroesophageal reflux disease)   . Exertional shortness of breath   . Major depressive disorder   . Back pain   . Arthritis   . Generalized anxiety disorder   . Anemia   . Chronic respiratory failure ([place]) 09/14/2013  . 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 Morbid obesity ([place]) 11/01/2012  . Chronic systolic heart failure ([place]) 10/27/2012  . Endometrial polyp 01/20/2012  . Malignant tumor of breast ([place]) 03/26/2011  . Unspecified vitamin D deficiency 03/26/2011    Current Outpatient Medications:  .  acetaZOLAMIDE (DIAMOX) 125 MG tablet, TAKE 1 TABLET(125 MG) BY MOUTH DAILY, Disp: 90 tablet, Rfl: 3 .  carvedilol (COREG) 3.125 MG tablet, TAKE 1 TABLET (3.125 MG) BY MOUTH TWICE DAILY WITH MEALS, Disp: 180 tablet, Rfl: 3 .  citalopram (CELEXA) 20 MG tablet, Take 20 mg by mouth daily., Disp: , Rfl:  .  dicyclomine (BENTYL) 20 MG tablet, Take 20 mg by mouth every 6 (six) hours., Disp: , Rfl:  .  DULoxetine (CYMBALTA) 30 MG capsule, Take 30 mg by mouth daily. Take once in the morning., Disp: , Rfl:  .  fexofenadine (ALLEGRA) 180 MG tablet, Take 180 mg by mouth 2 (two) times daily. (Patient not taking: Reported on 07/30/2020), Disp: , Rfl:  .  fludrocortisone (FLORINEF) 0.1 MG tablet, TAKE 1 TABLET TWICE DAILY, Disp: 180 tablet, Rfl: 1 .  gabapentin (NEURONTIN) 300 MG capsule, Take 300 mg by mouth 3 (three) times daily., Disp: , Rfl:  .  gabapentin (NEURONTIN) 800 MG tablet, TAKE 1 TABLET AT BEDTIME  AS NEEDED FOR NEUROPATHY, Disp: 90 tablet, Rfl: 0 .  Ginkgo Biloba 120 MG TABS, Take by mouth., Disp: , Rfl:  .  hydrOXYzine (ATARAX/VISTARIL) 25 MG tablet, Take 25 mg by mouth 2 (two) times daily. Takes Morning and Bedtime (Patient not taking: Reported on 07/30/2020), Disp: , Rfl:  .  hydrOXYzine (VISTARIL) 25 MG capsule, Take 25 mg by mouth 3 (three) times daily as needed. (Patient not taking: Reported on 07/30/2020), Disp: , Rfl:   .  ipratropium (ATROVENT) 0.03 % nasal spray, Place 2 sprays into both nostrils 2 (two) times daily., Disp: , Rfl:  .  levothyroxine (SYNTHROID) 88 MCG tablet, TAKE 1 TABLET(88 MCG) BY MOUTH DAILY BEFORE BREAKFAST, Disp: 90 tablet, Rfl: 3 .  metolazone (ZAROXOLYN) 2.5 MG tablet, Take 1 tablet by mouth every Tuesday, Disp: 12 tablet, Rfl: 3 .  midodrine (PROAMATINE) 10 MG tablet, TAKE 1 TABLET (10 MG TOTAL) BY MOUTH 3 (THREE) TIMES DAILY WITH MEALS., Disp: 270 tablet, Rfl: 3 .  mirtazapine (REMERON) 30 MG tablet, TAKE 1 TABLET BY MOUTH EVERY NIGHT AT BEDTIME, Disp: 30 tablet, Rfl: 3 .  montelukast (SINGULAIR) 10 MG tablet, Take 10 mg by mouth at bedtime. , Disp: , Rfl: 1 .  omeprazole (PRILOSEC) 40 MG capsule, Take 40 mg by mouth daily., Disp: , Rfl:  .  potassium chloride SA (KLOR-CON) 20 MEQ tablet, TAKE 2 TABLETS THREE TIMES DAILY. TAKE AN EXTRA 2 TABLETS ON DAYS YOU TAKE METOLAZONE (Patient taking differently: Take 20 mEq by mouth 3 (three) times daily. Take 3 tablets three times daily. Take an extra 2 tablets on days you take Metolazone), Disp: 572 tablet, Rfl: 0 .  probenecid (BENEMID) 500 MG tablet, Take 500 mg by mouth 2 (two) times daily., Disp: , Rfl:  .  simvastatin (ZOCOR) 10 MG tablet, Take 10 mg by mouth every evening., Disp: , Rfl:  .  spironolactone (ALDACTONE) 25 MG tablet, Take 1 tablet (25 mg total) by mouth every evening., Disp: 90 tablet, Rfl: 3 .  torsemide (DEMADEX) 20 MG tablet, Take 3 tablets (60 mg total) by mouth 2 (two) times daily., Disp: 540 tablet, Rfl: 3 .  traMADol (ULTRAM) 50 MG tablet, Take by mouth every 6 (six) hours as needed., Disp: , Rfl:  Allergies  Allergen Reactions  . Ceftin Anaphylaxis    Face and throat swell   . Geodon [Ziprasidone Hcl] Hives  . Lisinopril Other (See Comments)    angioedema  . Shellfish Allergy Other (See Comments)    Gout exacerbation  . Allopurinol Nausea Only and Other (See Comments)    weakness  . Ativan [Lorazepam] Itching   . Sulfa Antibiotics Itching  . Ultram [Tramadol Hcl] Itching  . Valium [Diazepam] Other (See Comments)    Patient states that diazepam doesn't relax, it has the opposite effect.     Social History   Socioeconomic History  . Marital status: Married    Spouse name: Not on file  . Number of children: 2  . Years of education: 37  . Highest education level: Master's degree (e.g., MA, MS, MEng, MEd, MSW, MBA)  Occupational History  . Occupation: retired  Tobacco Use  . Smoking status: Current Some Day Smoker    Packs/day: 0.10    Years: 26.00    Pack years: 2.60    Types: Cigarettes    Last attempt to quit: 05/06/2007    Years since quitting: 13.2  . Smokeless tobacco: Never Used  Vaping Use  . Vaping Use:  Never used  Substance and Sexual Activity  . Alcohol use: No  . Drug use: No  . Sexual activity: Yes  Other Topics Concern  . 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  HENT:     Nose: Rhinorrhea present.     Mouth/Throat:     Mouth: Mucous membranes are moist.     Pharynx: Oropharynx is clear.  Eyes:     Conjunctiva/sclera: Conjunctivae normal.     Pupils: Pupils are equal, round, and reactive to light.  Cardiovascular:     Rate and Rhythm: Normal rate and regular rhythm.     Pulses: Normal pulses.     Heart sounds:  Normal heart sounds.  Pulmonary:     Effort: Pulmonary effort is normal.     Breath sounds: Normal breath sounds.  Abdominal:     General: Abdomen is flat.     Palpations: Abdomen is soft.  Musculoskeletal:        General: Swelling present. Normal range of motion.     Cervical back: Normal range of motion.     Right lower leg: Edema present.     Left lower leg: Edema present.     Comments: Mild edema with swelling and some redness noted.   Skin:    General: Skin is warm and dry.     Capillary Refill: Capillary refill takes less than 2 seconds.  Neurological:     General: No focal deficit present.     Mental Status: She is alert. Mental status is at baseline.  Psychiatric:        Mood and Affect: Mood normal.     Arrived for home visit for Valentina who was alert and oriented reporting she was feeling tired today. Terrilee reports she had a home nurse come by and assess her and set up a PCP visit with Dr. Justin Mend for Friday to have her examined in the office for leg swelling. Dashay has been prone to cellulitis and her lower legs were swollen a little more than normal with some redness noted this week. I encouraged her to keep her legs elevated and to follow through with PCP visit for Friday. [name] agreed with plan. Tashaya's weight is up this week and she reports she hasn't been sticking to her "plan" but she will get back on track. I encouraged same. I reviewed medications and filled one pill box. Karlena was given list of appointments and we reviewed same. Artina denied chest pain, dizziness of shortness of breath. Home visit complete. I will see her in one week.   Refills: OTC ALLEGRA     Future Appointments  Date Time Provider [place]  08/08/2020  1:00 PM Kirsteins, Luanna Salk, MD  [facility]  10/10/2020  7:40 AM [facility] DEVICE REMOTES [facility]  01/09/2021  7:40 AM [facility] DEVICE REMOTES [facility]  02/27/2021  9:50 AM [name], Donika K,  DO [facility] None  04/10/2021  7:40 AM [facility] DEVICE REMOTES [facility]  07/10/2021  7:40 AM [facility] DEVICE REMOTES [facility]  08/11/2021  7:40 AM [facility] DEVICE REMOTES [facility]  09/10/2021  7:40 AM [facility] DEVICE REMOTES [facility]     ACTION: Home visit completed Next visit planned for ONE WEEK

## 2020-08-07 NOTE — Progress Notes (Signed)
EPIC Encounter for ICM Monitoring  Patient Name: Rhonda [name] is a 70 y.o. female Date: 08/07/2020 Primary Care Physican: Maurice Small, MD Primary Cardiologist:Bensimhon Electrophysiologist: Lovena Le Bi-V Pacing:98.6% 3/3/2022OfficeWeight:206lbs   Transmission reviewed.  Optivol thoracic impedancenormal.  Prescribed: Patient participating in paramedicine program to assist with meds.  Torsemide 20 mg3tablets (60 mg total)by mouthtwice a day.  Potassium 20 meq3tablets (60 mEq total) by mouth 3 (three) times daily.Take extra 40 mEq on days you take Metolazone.  Metolazone 2.5mg  1 tabletevery Tuesday.  Spironolactone 25 mg take 1 tablet by mouth every evening.  Labs: 04/09/2020 Creatinine1.44, BUN40, Potassium4.9, [id number]  03/27/2020 Creatinine1.71, BUN33, Potassium3.8, Sodium141, GFR32  03/14/2020 Creatinine1.67, BUN30, Potassium3.4, Sodium139, [id number] A complete set of results can be found in Results Review.  Recommendations:No changes.  Follow-up plan: ICM clinic phone appointment on4/18/2022. 91 day device clinic remote transmission5/19/2022.   EP/Cardiology Office Visits: Recall 09/10/2020 with Dr Haroldine Laws.  Recall 07/26/2021 with Dr Lovena Le.  Copy of ICM check sent to Dr.Taylor.   3 month ICM trend: 08/05/2020.    1 Year ICM trend:       [name], RN 08/07/2020 12:36 PM

## 2020-08-08 ENCOUNTER — Encounter: Payer: Medicare HMO | Attending: Physical Medicine & Rehabilitation | Admitting: Physical Medicine & Rehabilitation

## 2020-08-09 ENCOUNTER — Other Ambulatory Visit (HOSPITAL_COMMUNITY): Payer: Self-pay | Admitting: Internal Medicine

## 2020-08-09 DIAGNOSIS — I83893 Varicose veins of bilateral lower extremities with other complications: Secondary | ICD-10-CM | POA: Diagnosis not present

## 2020-08-12 DIAGNOSIS — F32A Depression, unspecified: Secondary | ICD-10-CM | POA: Diagnosis not present

## 2020-08-12 DIAGNOSIS — I5022 Chronic systolic (congestive) heart failure: Secondary | ICD-10-CM | POA: Diagnosis not present

## 2020-08-12 DIAGNOSIS — E1140 Type 2 diabetes mellitus with diabetic neuropathy, unspecified: Secondary | ICD-10-CM | POA: Diagnosis not present

## 2020-08-12 DIAGNOSIS — M48061 Spinal stenosis, lumbar region without neurogenic claudication: Secondary | ICD-10-CM | POA: Diagnosis not present

## 2020-08-12 DIAGNOSIS — R519 Headache, unspecified: Secondary | ICD-10-CM | POA: Diagnosis not present

## 2020-08-12 DIAGNOSIS — I130 Hypertensive heart and chronic kidney disease with heart failure and stage 1 through stage 4 chronic kidney disease, or unspecified chronic kidney disease: Secondary | ICD-10-CM | POA: Diagnosis not present

## 2020-08-12 DIAGNOSIS — G4700 Insomnia, unspecified: Secondary | ICD-10-CM | POA: Diagnosis not present

## 2020-08-12 DIAGNOSIS — J449 Chronic obstructive pulmonary disease, unspecified: Secondary | ICD-10-CM | POA: Diagnosis not present

## 2020-08-12 DIAGNOSIS — N183 Chronic kidney disease, stage 3 unspecified: Secondary | ICD-10-CM | POA: Diagnosis not present

## 2020-08-13 ENCOUNTER — Other Ambulatory Visit (HOSPITAL_COMMUNITY): Payer: Self-pay

## 2020-08-13 NOTE — Progress Notes (Signed)
Paramedicine Encounter    Patient ID: Rhonda [name], female    DOB: 09/23/1950, 70 y.o.   MRN: [id number]   Patient Care Team: Maurice Small, MD as PCP - General (Family Medicine) [name], LCSW as Social Worker (Licensed Clinical Social Worker) [name], [name], DO as Consulting Physician (Neurology)  Patient Active Problem List   Diagnosis Date Noted  . Abnormal gait 06/11/2020  . Adult failure to thrive syndrome 06/11/2020  . Allergic rhinitis 06/11/2020  . Anxiety 06/11/2020  . Asthma 06/11/2020  . Benign intracranial hypertension 06/11/2020  . Body mass index (BMI) 45.0-49.9, adult ([place]) 06/11/2020  . Bowel incontinence 06/11/2020  . Cholelithiasis 06/11/2020  . Chronic sinusitis 06/11/2020  . 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  . Daytime somnolence 06/11/2020  . Diarrhea of presumed infectious origin 06/11/2020  . Edema 06/11/2020  . Family history of malignant neoplasm of gastrointestinal tract 06/11/2020  . Gout 06/11/2020  . History of fall 06/11/2020  . History of infectious disease 06/11/2020  . Insomnia 06/11/2020  . Irregular bowel habits 06/11/2020  . 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 Lumbar spondylosis with myelopathy 06/11/2020  . Macrocytosis 06/11/2020  . Mild recurrent major depression ([place]) 06/11/2020  . Personal history of colonic polyps 06/11/2020  . Personal history of malignant neoplasm of breast 06/11/2020  . Recurrent falls 06/11/2020  . Mixed anxiety and depressive disorder 06/11/2020  . Irritable bowel syndrome 01/04/2020  . Spinal stenosis of lumbar region 01/03/2020  . Diabetes mellitus with neuropathy ([place]) 06/15/2019  . Hypokalemia 11/08/2018  . CKD (chronic kidney disease) stage 3, GFR 30-59 ml/min (HCC) 11/08/2018  . Orthostatic hypotension 07/28/2017  . SVD (spontaneous vaginal delivery)   . Peripheral neuropathy   . On home oxygen therapy   . Migraines   . Left bundle branch block   . Hypothyroidism   . Hypertension   . Hyperlipidemia    . Heart murmur   . GERD (gastroesophageal reflux disease)   . Exertional shortness of breath   . Major depressive disorder   . Back pain   . Arthritis   . Generalized anxiety disorder   . Anemia   . Chronic respiratory failure ([place]) 09/14/2013  . 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 Morbid obesity ([place]) 11/01/2012  . Chronic systolic heart failure ([place]) 10/27/2012  . Endometrial polyp 01/20/2012  . Malignant tumor of breast ([place]) 03/26/2011  . Unspecified vitamin D deficiency 03/26/2011    Current Outpatient Medications:  .  acetaZOLAMIDE (DIAMOX) 125 MG tablet, TAKE 1 TABLET(125 MG) BY MOUTH DAILY, Disp: 90 tablet, Rfl: 3 .  carvedilol (COREG) 3.125 MG tablet, TAKE 1 TABLET (3.125 MG) BY MOUTH TWICE DAILY WITH MEALS, Disp: 180 tablet, Rfl: 3 .  citalopram (CELEXA) 20 MG tablet, Take 20 mg by mouth daily., Disp: , Rfl:  .  dicyclomine (BENTYL) 20 MG tablet, Take 20 mg by mouth every 6 (six) hours., Disp: , Rfl:  .  DULoxetine (CYMBALTA) 30 MG capsule, Take 30 mg by mouth daily. Take once in the morning., Disp: , Rfl:  .  fexofenadine (ALLEGRA) 180 MG tablet, Take 180 mg by mouth 2 (two) times daily. (Patient not taking: No sig reported), Disp: , Rfl:  .  fludrocortisone (FLORINEF) 0.1 MG tablet, TAKE 1 TABLET TWICE DAILY, Disp: 180 tablet, Rfl: 1 .  gabapentin (NEURONTIN) 300 MG capsule, Take 300 mg by mouth 3 (three) times daily., Disp: , Rfl:  .  gabapentin (NEURONTIN) 800 MG tablet, TAKE 1 TABLET AT BEDTIME  AS NEEDED FOR NEUROPATHY, Disp: 90 tablet, Rfl: 0 .  Ginkgo Biloba 120 MG TABS, Take by mouth., Disp: , Rfl:  .  hydrOXYzine (ATARAX/VISTARIL) 25 MG tablet, Take 25 mg by mouth 2 (two) times daily. Takes Morning and Bedtime (Patient not taking: No sig reported), Disp: , Rfl:  .  hydrOXYzine (VISTARIL) 25 MG capsule, Take 25 mg by mouth 3 (three) times daily as needed. (Patient not taking: No sig reported), Disp: , Rfl:  .  ipratropium  (ATROVENT) 0.03 % nasal spray, Place 2 sprays into both nostrils 2 (two) times daily., Disp: , Rfl:  .  levothyroxine (SYNTHROID) 88 MCG tablet, TAKE 1 TABLET(88 MCG) BY MOUTH DAILY BEFORE BREAKFAST, Disp: 90 tablet, Rfl: 3 .  metolazone (ZAROXOLYN) 2.5 MG tablet, Take 1 tablet by mouth every Tuesday, Disp: 12 tablet, Rfl: 3 .  midodrine (PROAMATINE) 10 MG tablet, TAKE 1 TABLET (10 MG TOTAL) BY MOUTH 3 (THREE) TIMES DAILY WITH MEALS., Disp: 270 tablet, Rfl: 3 .  mirtazapine (REMERON) 30 MG tablet, TAKE 1 TABLET BY MOUTH EVERY NIGHT AT BEDTIME, Disp: 30 tablet, Rfl: 3 .  montelukast (SINGULAIR) 10 MG tablet, Take 10 mg by mouth at bedtime. , Disp: , Rfl: 1 .  omeprazole (PRILOSEC) 40 MG capsule, Take 40 mg by mouth daily., Disp: , Rfl:  .  potassium chloride SA (KLOR-CON) 20 MEQ tablet, TAKE 2 TABLETS THREE TIMES DAILY. TAKE AN EXTRA 2 TABLETS ON DAYS YOU TAKE METOLAZONE, Disp: 572 tablet, Rfl: 0 .  probenecid (BENEMID) 500 MG tablet, Take 500 mg by mouth 2 (two) times daily., Disp: , Rfl:  .  simvastatin (ZOCOR) 10 MG tablet, Take 10 mg by mouth every evening., Disp: , Rfl:  .  spironolactone (ALDACTONE) 25 MG tablet, Take 1 tablet (25 mg total) by mouth every evening., Disp: 90 tablet, Rfl: 3 .  torsemide (DEMADEX) 20 MG tablet, Take 3 tablets (60 mg total) by mouth 2 (two) times daily., Disp: 540 tablet, Rfl: 3 .  traMADol (ULTRAM) 50 MG tablet, Take by mouth every 6 (six) hours as needed. (Patient not taking: Reported on 08/06/2020), Disp: , Rfl:  Allergies  Allergen Reactions  . Ceftin Anaphylaxis    Face and throat swell   . Geodon [Ziprasidone Hcl] Hives  . Lisinopril Other (See Comments)    angioedema  . Shellfish Allergy Other (See Comments)    Gout exacerbation  . Allopurinol Nausea Only and Other (See Comments)    weakness  . Ativan [Lorazepam] Itching  . Sulfa Antibiotics Itching  . Ultram [Tramadol Hcl] Itching  . Valium [Diazepam] Other (See Comments)    Patient states that  diazepam doesn't relax, it has the opposite effect.     Social History   Socioeconomic History  . Marital status: Married    Spouse name: Not on file  . Number of children: 2  . Years of education: 60  . Highest education level: Master's degree (e.g., MA, MS, MEng, MEd, MSW, MBA)  Occupational History  . Occupation: retired  Tobacco Use  . Smoking status: Current Some Day Smoker    Packs/day: 0.10    Years: 26.00    Pack years: 2.60    Types: Cigarettes    Last attempt to quit: 05/06/2007    Years since quitting: 13.2  . Smokeless tobacco: Never Used  Vaping Use  . Vaping Use: Never used  Substance and Sexual Activity  . Alcohol use: No  . Drug use: No  . Sexual activity: Yes  Other Topics Concern  . 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  HENT:     Head: Normocephalic.     Nose: Nose normal.     Mouth/Throat:     Mouth: Mucous membranes are moist.     Pharynx: Oropharynx is clear.  Eyes:     Conjunctiva/sclera: Conjunctivae normal.     Pupils: Pupils are equal, round, and reactive to light.  Cardiovascular:     Rate and Rhythm: Normal rate and regular rhythm.     Pulses: Normal pulses.     Heart sounds: Normal heart sounds.  Pulmonary:     Effort: Pulmonary effort is normal.     Breath sounds: Normal breath sounds.  Abdominal:     General: Abdomen  is flat.     Palpations: Abdomen is soft.  Musculoskeletal:        General: Normal range of motion.     Cervical back: Normal range of motion.     Right lower leg: No edema.     Left lower leg: No edema.  Skin:    General: Skin is warm and dry.     Capillary Refill: Capillary refill takes less than 2 seconds.  Neurological:     General: No focal deficit present.     Mental Status: She is alert. Mental status is at baseline.  Psychiatric:        Mood and Affect: Mood normal.     Arrived for home visit for Rhonda [name] who was alert and oriented reporting to be feeling okay just sleepy today. Rhonda [name] reports she has had no episodes of dizziness, shortness of breath, chest pain over the last week. Rhonda [name] has been compliant with her medications over the last week except for Friday's dose. Rhonda [name]'s vitals were obtained and are as noted. Legs looked her normal today no increased swelling, edema or redness noted. Rhonda [name]'s medications were reviewed and confirmed. I filled two pill boxes for Rhonda [name] accordingly. Rhonda [name] was reminded of appointments. Rhonda [name] agreed to home visit in two weeks. Home visit complete.   Refills: NONE    THIS WEEKS WEIGHT- 207 LAST WEEKS WEIGHT- 210    Future Appointments  Date Time Provider [place]  09/09/2020  7:05 AM [facility] DEVICE REMOTES [facility]  10/10/2020  7:40 AM [facility] DEVICE REMOTES [facility]  01/09/2021  7:40 AM [facility] DEVICE REMOTES [facility]  02/27/2021  9:50 AM [name], Donika K, DO [facility] None  04/10/2021  7:40 AM [facility] DEVICE REMOTES [facility]  07/10/2021  7:40 AM [facility]ICE REMOTES [facility]  08/11/2021  7:40 AM [facility]ICE [facility]ES [facility]  09/10/2021  7:40 AM [facility] DEVICE REMOTES [facility]     ACTION: Home visit completed Next visit planned for two weeks

## 2020-08-15 DIAGNOSIS — F3342 Major depressive disorder, recurrent, in full remission: Secondary | ICD-10-CM | POA: Diagnosis not present

## 2020-08-19 ENCOUNTER — Telehealth (HOSPITAL_COMMUNITY): Payer: Self-pay | Admitting: Licensed Clinical Social Worker
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
## 2020-08-20 DIAGNOSIS — G4700 Insomnia, unspecified: Secondary | ICD-10-CM | POA: Diagnosis not present

## 2020-08-20 DIAGNOSIS — I130 Hypertensive heart and chronic kidney disease with heart failure and stage 1 through stage 4 chronic kidney disease, or unspecified chronic kidney disease: Secondary | ICD-10-CM | POA: Diagnosis not present

## 2020-08-20 DIAGNOSIS — Z91411 Personal history of adult psychological abuse: Secondary | ICD-10-CM | POA: Diagnosis not present

## 2020-08-20 DIAGNOSIS — J449 Chronic obstructive pulmonary disease, unspecified: Secondary | ICD-10-CM | POA: Diagnosis not present

## 2020-08-20 DIAGNOSIS — N183 Chronic kidney disease, stage 3 unspecified: Secondary | ICD-10-CM | POA: Diagnosis not present

## 2020-08-20 DIAGNOSIS — R519 Headache, unspecified: Secondary | ICD-10-CM | POA: Diagnosis not present

## 2020-08-20 DIAGNOSIS — I5022 Chronic systolic (congestive) heart failure: Secondary | ICD-10-CM | POA: Diagnosis not present

## 2020-08-20 DIAGNOSIS — F32A Depression, unspecified: Secondary | ICD-10-CM | POA: Diagnosis not present

## 2020-08-20 DIAGNOSIS — E1140 Type 2 diabetes mellitus with diabetic neuropathy, unspecified: Secondary | ICD-10-CM | POA: Diagnosis not present

## 2020-08-20 DIAGNOSIS — M48061 Spinal stenosis, lumbar region without neurogenic claudication: Secondary | ICD-10-CM | POA: Diagnosis not present

## 2020-08-27 ENCOUNTER — Other Ambulatory Visit (HOSPITAL_COMMUNITY): Payer: Self-pay

## 2020-08-27 NOTE — Progress Notes (Signed)
Paramedicine Encounter    Patient ID: Rhonda [name], female    DOB: 09/23/1950, 70 y.o.   MRN: [id number]   Patient Care Team: Maurice Small, MD as PCP - General (Family Medicine) [name], LCSW as Social Worker (Licensed Clinical Social Worker) [name], [name], DO as Consulting Physician (Neurology)  Patient Active Problem List   Diagnosis Date Noted  . Abnormal gait 06/11/2020  . Adult failure to thrive syndrome 06/11/2020  . Allergic rhinitis 06/11/2020  . Anxiety 06/11/2020  . Asthma 06/11/2020  . Benign intracranial hypertension 06/11/2020  . Body mass index (BMI) 45.0-49.9, adult ([place]) 06/11/2020  . Bowel incontinence 06/11/2020  . Cholelithiasis 06/11/2020  . Chronic sinusitis 06/11/2020  . 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  . Daytime somnolence 06/11/2020  . Diarrhea of presumed infectious origin 06/11/2020  . Edema 06/11/2020  . Family history of malignant neoplasm of gastrointestinal tract 06/11/2020  . Gout 06/11/2020  . History of fall 06/11/2020  . History of infectious disease 06/11/2020  . Insomnia 06/11/2020  . Irregular bowel habits 06/11/2020  . 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 Lumbar spondylosis with myelopathy 06/11/2020  . Macrocytosis 06/11/2020  . Mild recurrent major depression ([place]) 06/11/2020  . Personal history of colonic polyps 06/11/2020  . Personal history of malignant neoplasm of breast 06/11/2020  . Recurrent falls 06/11/2020  . Mixed anxiety and depressive disorder 06/11/2020  . Irritable bowel syndrome 01/04/2020  . Spinal stenosis of lumbar region 01/03/2020  . Diabetes mellitus with neuropathy ([place]) 06/15/2019  . Hypokalemia 11/08/2018  . CKD (chronic kidney disease) stage 3, GFR 30-59 ml/min (HCC) 11/08/2018  . Orthostatic hypotension 07/28/2017  . SVD (spontaneous vaginal delivery)   . Peripheral neuropathy   . On home oxygen therapy   . Migraines   . Left bundle branch block   . Hypothyroidism   . Hypertension   . Hyperlipidemia    . Heart murmur   . GERD (gastroesophageal reflux disease)   . Exertional shortness of breath   . Major depressive disorder   . Back pain   . Arthritis   . Generalized anxiety disorder   . Anemia   . Chronic respiratory failure ([place]) 09/14/2013  . 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 Morbid obesity ([place]) 11/01/2012  . Chronic systolic heart failure ([place]) 10/27/2012  . Endometrial polyp 01/20/2012  . Malignant tumor of breast ([place]) 03/26/2011  . Unspecified vitamin D deficiency 03/26/2011    Current Outpatient Medications:  .  acetaZOLAMIDE (DIAMOX) 125 MG tablet, TAKE 1 TABLET(125 MG) BY MOUTH DAILY, Disp: 90 tablet, Rfl: 3 .  carvedilol (COREG) 3.125 MG tablet, TAKE 1 TABLET (3.125 MG) BY MOUTH TWICE DAILY WITH MEALS, Disp: 180 tablet, Rfl: 3 .  citalopram (CELEXA) 20 MG tablet, Take 20 mg by mouth daily., Disp: , Rfl:  .  dicyclomine (BENTYL) 20 MG tablet, Take 20 mg by mouth every 6 (six) hours., Disp: , Rfl:  .  DULoxetine (CYMBALTA) 30 MG capsule, Take 30 mg by mouth daily. Take once in the morning., Disp: , Rfl:  .  fexofenadine (ALLEGRA) 180 MG tablet, Take 180 mg by mouth 2 (two) times daily. (Patient not taking: No sig reported), Disp: , Rfl:  .  fludrocortisone (FLORINEF) 0.1 MG tablet, TAKE 1 TABLET TWICE DAILY, Disp: 180 tablet, Rfl: 1 .  gabapentin (NEURONTIN) 300 MG capsule, Take 300 mg by mouth 3 (three) times daily., Disp: , Rfl:  .  gabapentin (NEURONTIN) 800 MG tablet, TAKE 1 TABLET AT BEDTIME  AS NEEDED FOR NEUROPATHY, Disp: 90 tablet, Rfl: 0 .  Ginkgo Biloba 120 MG TABS, Take by mouth., Disp: , Rfl:  .  hydrOXYzine (ATARAX/VISTARIL) 25 MG tablet, Take 25 mg by mouth 2 (two) times daily. Takes Morning and Bedtime (Patient not taking: No sig reported), Disp: , Rfl:  .  hydrOXYzine (VISTARIL) 25 MG capsule, Take 25 mg by mouth 3 (three) times daily as needed. (Patient not taking: No sig reported), Disp: , Rfl:  .  ipratropium  (ATROVENT) 0.03 % nasal spray, Place 2 sprays into both nostrils 2 (two) times daily., Disp: , Rfl:  .  levothyroxine (SYNTHROID) 88 MCG tablet, TAKE 1 TABLET(88 MCG) BY MOUTH DAILY BEFORE BREAKFAST, Disp: 90 tablet, Rfl: 3 .  metolazone (ZAROXOLYN) 2.5 MG tablet, Take 1 tablet by mouth every Tuesday, Disp: 12 tablet, Rfl: 3 .  midodrine (PROAMATINE) 10 MG tablet, TAKE 1 TABLET (10 MG TOTAL) BY MOUTH 3 (THREE) TIMES DAILY WITH MEALS., Disp: 270 tablet, Rfl: 3 .  mirtazapine (REMERON) 30 MG tablet, TAKE 1 TABLET BY MOUTH EVERY NIGHT AT BEDTIME, Disp: 30 tablet, Rfl: 3 .  montelukast (SINGULAIR) 10 MG tablet, Take 10 mg by mouth at bedtime. , Disp: , Rfl: 1 .  omeprazole (PRILOSEC) 40 MG capsule, Take 40 mg by mouth daily., Disp: , Rfl:  .  potassium chloride SA (KLOR-CON) 20 MEQ tablet, TAKE 2 TABLETS THREE TIMES DAILY. TAKE AN EXTRA 2 TABLETS ON DAYS YOU TAKE METOLAZONE, Disp: 572 tablet, Rfl: 0 .  probenecid (BENEMID) 500 MG tablet, Take 500 mg by mouth 2 (two) times daily., Disp: , Rfl:  .  simvastatin (ZOCOR) 10 MG tablet, Take 10 mg by mouth every evening., Disp: , Rfl:  .  spironolactone (ALDACTONE) 25 MG tablet, Take 1 tablet (25 mg total) by mouth every evening., Disp: 90 tablet, Rfl: 3 .  torsemide (DEMADEX) 20 MG tablet, Take 3 tablets (60 mg total) by mouth 2 (two) times daily., Disp: 540 tablet, Rfl: 3 .  traMADol (ULTRAM) 50 MG tablet, Take by mouth every 6 (six) hours as needed. (Patient not taking: No sig reported), Disp: , Rfl:  Allergies  Allergen Reactions  . Ceftin Anaphylaxis    Face and throat swell   . Geodon [Ziprasidone Hcl] Hives  . Lisinopril Other (See Comments)    angioedema  . Shellfish Allergy Other (See Comments)    Gout exacerbation  . Allopurinol Nausea Only and Other (See Comments)    weakness  . Ativan [Lorazepam] Itching  . Sulfa Antibiotics Itching  . Ultram [Tramadol Hcl] Itching  . Valium [Diazepam] Other (See Comments)    Patient states that diazepam  doesn't relax, it has the opposite effect.     Social History   Socioeconomic History  . Marital status: Married    Spouse name: Not on file  . Number of children: 2  . Years of education: 33  . Highest education level: Master's degree (e.g., MA, MS, MEng, MEd, MSW, MBA)  Occupational History  . Occupation: retired  Tobacco Use  . Smoking status: Current Some Day Smoker    Packs/day: 0.10    Years: 26.00    Pack years: 2.60    Types: Cigarettes    Last attempt to quit: 05/06/2007    Years since quitting: 13.3  . Smokeless tobacco: Never Used  Vaping Use  . Vaping Use: Never used  Substance and Sexual Activity  . Alcohol use: No  . Drug use: No  . Sexual activity: Yes  Other Topics Concern  . 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  Constitutional:      Appearance: She is normal weight.  HENT:     Head: Normocephalic.     Nose: Nose normal.     Mouth/Throat:     Mouth: Mucous membranes are moist.     Pharynx: Oropharynx is clear.  Eyes:     Conjunctiva/sclera: Conjunctivae normal.     Pupils: Pupils are equal, round, and reactive to light.  Cardiovascular:     Rate and Rhythm: Normal rate and regular rhythm.     Pulses: Normal pulses.     Heart sounds: Normal heart sounds.  Pulmonary:     Effort: Pulmonary effort is normal.     Breath sounds: Normal  breath sounds.  Abdominal:     General: Abdomen is flat.     Palpations: Abdomen is soft.  Musculoskeletal:        General: Normal range of motion.     Cervical back: Normal range of motion.  Skin:    General: Skin is warm and dry.     Capillary Refill: Capillary refill takes less than 2 seconds.  Neurological:     General: No focal deficit present.     Mental Status: She is alert. Mental status is at baseline.  Psychiatric:        Mood and Affect: Mood normal.        Behavior: Behavior normal.    Arrived for home visit for Hydeia who was alert and oriented reporting to be feeling "okay". Derrick denied any chest pain, shortness of breath, dizziness or trouble taking her medications. I obtained vitals and assessment as noted. [place] complied with medications over the last two weeks. I reviewed medications one by one with [name] and we filled two pill boxes together. She filled one while I filled the other step by step. Laila needed extreme coaching doing this and was easily confused without instructions. Both boxes were accurate. I plan to see Cinzia in one week for home visit. Appointments reviewed. Weight this week down 5lbs. 207 to 202. Loetta was happy about this. I encouraged her to keep up the good work. Pamula expressed no other social or health related issues during our visit. Home visit complete. I will see her in one week.   Refills:  NONE     Future Appointments  Date Time Provider [place]  09/09/2020  7:05 AM [facility]ICE [facility]OT[facility]  10/10/2020  7:40 AM [facility]ICE REMOT[facility]  01/09/2021  7:40 AM [facility] REMOT[facility]  02/27/2021  9:50 AM [name], Donika K, DO [facility] None  04/10/2021  7:40 AM [facility]ICE REMOTES [facility]  07/10/2021  7:40 AM [facility] DEVICE  REMOTES [facility]  08/11/2021  7:40 AM [facility]  09/10/2021  7:40 AM [facility]ICE REMOTES [facility]     ACTION: Home visit completed Next visit planned for one week

## 2020-09-02 DIAGNOSIS — R519 Headache, unspecified: Secondary | ICD-10-CM | POA: Diagnosis not present

## 2020-09-02 DIAGNOSIS — I130 Hypertensive heart and chronic kidney disease with heart failure and stage 1 through stage 4 chronic kidney disease, or unspecified chronic kidney disease: Secondary | ICD-10-CM | POA: Diagnosis not present

## 2020-09-02 DIAGNOSIS — I5022 Chronic systolic (congestive) heart failure: Secondary | ICD-10-CM | POA: Diagnosis not present

## 2020-09-02 DIAGNOSIS — J449 Chronic obstructive pulmonary disease, unspecified: Secondary | ICD-10-CM | POA: Diagnosis not present

## 2020-09-02 DIAGNOSIS — M48061 Spinal stenosis, lumbar region without neurogenic claudication: Secondary | ICD-10-CM | POA: Diagnosis not present

## 2020-09-02 DIAGNOSIS — F32A Depression, unspecified: Secondary | ICD-10-CM | POA: Diagnosis not present

## 2020-09-02 DIAGNOSIS — G4700 Insomnia, unspecified: Secondary | ICD-10-CM | POA: Diagnosis not present

## 2020-09-02 DIAGNOSIS — E1140 Type 2 diabetes mellitus with diabetic neuropathy, unspecified: Secondary | ICD-10-CM | POA: Diagnosis not present

## 2020-09-02 DIAGNOSIS — N183 Chronic kidney disease, stage 3 unspecified: Secondary | ICD-10-CM | POA: Diagnosis not present

## 2020-09-03 ENCOUNTER — Other Ambulatory Visit (HOSPITAL_COMMUNITY): Payer: Self-pay

## 2020-09-03 NOTE — Progress Notes (Signed)
Paramedicine Encounter    Patient ID: Rhonda [name], female    DOB: Aug 14, 1950, 70 y.o.   MRN: [id number]   Patient Care Team: Maurice Small, MD as PCP - General (Family Medicine) [name], LCSW as Social Worker (Licensed Clinical Social Worker) [name], [name], DO as Consulting Physician (Neurology)  Patient Active Problem List   Diagnosis Date Noted  . Abnormal gait 06/11/2020  . Adult failure to thrive syndrome 06/11/2020  . Allergic rhinitis 06/11/2020  . Anxiety 06/11/2020  . Asthma 06/11/2020  . Benign intracranial hypertension 06/11/2020  . Body mass index (BMI) 45.0-49.9, adult ([place]) 06/11/2020  . Bowel incontinence 06/11/2020  . Cholelithiasis 06/11/2020  . Chronic sinusitis 06/11/2020  . 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  . Daytime somnolence 06/11/2020  . Diarrhea of presumed infectious origin 06/11/2020  . Edema 06/11/2020  . Family history of malignant neoplasm of gastrointestinal tract 06/11/2020  . Gout 06/11/2020  . History of fall 06/11/2020  . History of infectious disease 06/11/2020  . Insomnia 06/11/2020  . Irregular bowel habits 06/11/2020  . 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 Lumbar spondylosis with myelopathy 06/11/2020  . Macrocytosis 06/11/2020  . Mild recurrent major depression ([place]) 06/11/2020  . Personal history of colonic polyps 06/11/2020  . Personal history of malignant neoplasm of breast 06/11/2020  . Recurrent falls 06/11/2020  . Mixed anxiety and depressive disorder 06/11/2020  . Irritable bowel syndrome 01/04/2020  . Spinal stenosis of lumbar region 01/03/2020  . Diabetes mellitus with neuropathy ([place]) 06/15/2019  . Hypokalemia 11/08/2018  . CKD (chronic kidney disease) stage 3, GFR 30-59 ml/min (HCC) 11/08/2018  . Orthostatic hypotension 07/28/2017  . SVD (spontaneous vaginal delivery)   . Peripheral neuropathy   . On home oxygen therapy   . Migraines   . Left bundle branch block   . Hypothyroidism   . Hypertension   . Hyperlipidemia    . Heart murmur   . GERD (gastroesophageal reflux disease)   . Exertional shortness of breath   . Major depressive disorder   . Back pain   . Arthritis   . Generalized anxiety disorder   . Anemia   . Chronic respiratory failure ([place]) 09/14/2013  . 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 Morbid obesity ([place]) 11/01/2012  . Chronic systolic heart failure ([place]) 10/27/2012  . Endometrial polyp 01/20/2012  . Malignant tumor of breast ([place]) 03/26/2011  . Unspecified vitamin D deficiency 03/26/2011    Current Outpatient Medications:  .  acetaZOLAMIDE (DIAMOX) 125 MG tablet, TAKE 1 TABLET(125 MG) BY MOUTH DAILY, Disp: 90 tablet, Rfl: 3 .  carvedilol (COREG) 3.125 MG tablet, TAKE 1 TABLET (3.125 MG) BY MOUTH TWICE DAILY WITH MEALS, Disp: 180 tablet, Rfl: 3 .  citalopram (CELEXA) 20 MG tablet, Take 20 mg by mouth daily., Disp: , Rfl:  .  dicyclomine (BENTYL) 20 MG tablet, Take 20 mg by mouth every 6 (six) hours., Disp: , Rfl:  .  DULoxetine (CYMBALTA) 30 MG capsule, Take 30 mg by mouth daily. Take once in the morning., Disp: , Rfl:  .  fexofenadine (ALLEGRA) 180 MG tablet, Take 180 mg by mouth 2 (two) times daily. (Patient not taking: No sig reported), Disp: , Rfl:  .  fludrocortisone (FLORINEF) 0.1 MG tablet, TAKE 1 TABLET TWICE DAILY, Disp: 180 tablet, Rfl: 1 .  gabapentin (NEURONTIN) 300 MG capsule, Take 300 mg by mouth 3 (three) times daily., Disp: , Rfl:  .  gabapentin (NEURONTIN) 800 MG tablet, TAKE 1 TABLET AT BEDTIME  AS NEEDED FOR NEUROPATHY, Disp: 90 tablet, Rfl: 0 .  Ginkgo Biloba 120 MG TABS, Take by mouth., Disp: , Rfl:  .  hydrOXYzine (ATARAX/VISTARIL) 25 MG tablet, Take 25 mg by mouth 2 (two) times daily. Takes Morning and Bedtime (Patient not taking: No sig reported), Disp: , Rfl:  .  hydrOXYzine (VISTARIL) 25 MG capsule, Take 25 mg by mouth 3 (three) times daily as needed. (Patient not taking: No sig reported), Disp: , Rfl:  .  ipratropium  (ATROVENT) 0.03 % nasal spray, Place 2 sprays into both nostrils 2 (two) times daily., Disp: , Rfl:  .  levothyroxine (SYNTHROID) 88 MCG tablet, TAKE 1 TABLET(88 MCG) BY MOUTH DAILY BEFORE BREAKFAST, Disp: 90 tablet, Rfl: 3 .  metolazone (ZAROXOLYN) 2.5 MG tablet, Take 1 tablet by mouth every Tuesday, Disp: 12 tablet, Rfl: 3 .  midodrine (PROAMATINE) 10 MG tablet, TAKE 1 TABLET (10 MG TOTAL) BY MOUTH 3 (THREE) TIMES DAILY WITH MEALS., Disp: 270 tablet, Rfl: 3 .  mirtazapine (REMERON) 30 MG tablet, TAKE 1 TABLET BY MOUTH EVERY NIGHT AT BEDTIME, Disp: 30 tablet, Rfl: 3 .  montelukast (SINGULAIR) 10 MG tablet, Take 10 mg by mouth at bedtime. , Disp: , Rfl: 1 .  omeprazole (PRILOSEC) 40 MG capsule, Take 40 mg by mouth daily., Disp: , Rfl:  .  potassium chloride SA (KLOR-CON) 20 MEQ tablet, TAKE 2 TABLETS THREE TIMES DAILY. TAKE AN EXTRA 2 TABLETS ON DAYS YOU TAKE METOLAZONE, Disp: 572 tablet, Rfl: 0 .  probenecid (BENEMID) 500 MG tablet, Take 500 mg by mouth 2 (two) times daily., Disp: , Rfl:  .  simvastatin (ZOCOR) 10 MG tablet, Take 10 mg by mouth every evening., Disp: , Rfl:  .  spironolactone (ALDACTONE) 25 MG tablet, Take 1 tablet (25 mg total) by mouth every evening., Disp: 90 tablet, Rfl: 3 .  torsemide (DEMADEX) 20 MG tablet, Take 3 tablets (60 mg total) by mouth 2 (two) times daily., Disp: 540 tablet, Rfl: 3 .  traMADol (ULTRAM) 50 MG tablet, Take by mouth every 6 (six) hours as needed. (Patient not taking: No sig reported), Disp: , Rfl:  Allergies  Allergen Reactions  . Ceftin Anaphylaxis    Face and throat swell   . Geodon [Ziprasidone Hcl] Hives  . Lisinopril Other (See Comments)    angioedema  . Shellfish Allergy Other (See Comments)    Gout exacerbation  . Allopurinol Nausea Only and Other (See Comments)    weakness  . Ativan [Lorazepam] Itching  . Sulfa Antibiotics Itching  . Ultram [Tramadol Hcl] Itching  . Valium [Diazepam] Other (See Comments)    Patient states that diazepam  doesn't relax, it has the opposite effect.     Social History   Socioeconomic History  . Marital status: Married    Spouse name: Not on file  . Number of children: 2  . Years of education: 65  . Highest education level: Master's degree (e.g., MA, MS, MEng, MEd, MSW, MBA)  Occupational History  . Occupation: retired  Tobacco Use  . Smoking status: Current Some Day Smoker    Packs/day: 0.10    Years: 26.00    Pack years: 2.60    Types: Cigarettes    Last attempt to quit: 05/06/2007    Years since quitting: 13.3  . Smokeless tobacco: Never Used  Vaping Use  . Vaping Use: Never used  Substance and Sexual Activity  . Alcohol use: No  . Drug use: No  . Sexual activity: Yes  Other Topics Concern  . 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  Constitutional:      Appearance: Normal appearance. She is normal weight.  HENT:     Head: Normocephalic.     Nose: Nose normal.     Mouth/Throat:     Mouth: Mucous membranes are moist.     Pharynx: Oropharynx is clear.  Eyes:     Conjunctiva/sclera: Conjunctivae normal.     Pupils: Pupils are equal, round, and reactive to light.  Cardiovascular:     Rate and Rhythm: Normal rate and regular rhythm.     Pulses: Normal pulses.     Heart sounds: Normal heart sounds.  Pulmonary:     Effort: Pulmonary effort is normal.      Breath sounds: Normal breath sounds.  Abdominal:     General: Abdomen is flat.     Palpations: Abdomen is soft.  Musculoskeletal:        General: Swelling present. Normal range of motion.     Cervical back: Normal range of motion.     Right lower leg: Edema present.     Left lower leg: Edema present.  Skin:    General: Skin is warm and dry.     Capillary Refill: Capillary refill takes less than 2 seconds.  Neurological:     General: No focal deficit present.     Mental Status: She is alert. Mental status is at baseline.  Psychiatric:        Mood and Affect: Mood normal.      Arrived for home visit for [facility] who was alert and oriented reporting to be feeling okay with no complaints. Dwayna has been compliant with her medications over the last week. I obtained vitals and assessment as noted. Bona denied chest pain, shortness of breath, dizziness or falls. I confirmed and reviewed medications and two pill boxes were filled accordingly. [place] was reminded of upcoming appointments and she agreed with same. Weight today 211. Up from 203 last week. Lucresha agreed to visit in two weeks. Home visit complete.   Refills Bentyl Gabapentin    Future Appointments  Date Time Provider [place]  09/09/2020  7:05 AM [facility] DEVICE REMOTES [facility]  10/10/2020  7:40 AM [facility] DEVICE REMOTES [facility]  01/09/2021  7:40 AM [facility] DEVICE REMOTES [facility]  02/27/2021  9:50 AM [name], Donika K, DO [facility] None  04/10/2021  7:40 AM [facility] DEVICE REMOTES [facility]  07/10/2021  7:40 AM [facility] DEVICE REMOTES [facility]  08/11/2021  7:40 AM [facility] DEVICE REMOTES [facility]  09/10/2021  7:40 AM [facility] DEVICE REMOTES [facility]     ACTION: Home visit completed Next visit planned for two weeks

## 2020-09-09 ENCOUNTER — Ambulatory Visit (INDEPENDENT_AMBULATORY_CARE_PROVIDER_SITE_OTHER): Payer: Medicare HMO

## 2020-09-09 ENCOUNTER — Other Ambulatory Visit (HOSPITAL_COMMUNITY): Payer: Self-pay | Admitting: *Deleted

## 2020-09-09 DIAGNOSIS — I5022 Chronic systolic (congestive) heart failure: Secondary | ICD-10-CM

## 2020-09-09 DIAGNOSIS — Z95810 Presence of automatic (implantable) cardiac defibrillator: Secondary | ICD-10-CM | POA: Diagnosis not present

## 2020-09-09 MED ORDER — SPIRONOLACTONE 25 MG PO TABS: 25.0000 mg | ORAL_TABLET | Freq: Every evening | ORAL | 3 refills | Status: DC

## 2020-09-10 ENCOUNTER — Other Ambulatory Visit: Payer: Self-pay | Admitting: Neurology

## 2020-09-10 ENCOUNTER — Other Ambulatory Visit (HOSPITAL_COMMUNITY): Payer: Self-pay | Admitting: Internal Medicine

## 2020-09-11 DIAGNOSIS — E1140 Type 2 diabetes mellitus with diabetic neuropathy, unspecified: Secondary | ICD-10-CM | POA: Diagnosis not present

## 2020-09-11 DIAGNOSIS — M109 Gout, unspecified: Secondary | ICD-10-CM | POA: Diagnosis not present

## 2020-09-11 DIAGNOSIS — I5022 Chronic systolic (congestive) heart failure: Secondary | ICD-10-CM | POA: Diagnosis not present

## 2020-09-11 DIAGNOSIS — J45909 Unspecified asthma, uncomplicated: Secondary | ICD-10-CM | POA: Diagnosis not present

## 2020-09-11 DIAGNOSIS — M48061 Spinal stenosis, lumbar region without neurogenic claudication: Secondary | ICD-10-CM | POA: Diagnosis not present

## 2020-09-11 DIAGNOSIS — I447 Left bundle-branch block, unspecified: Secondary | ICD-10-CM | POA: Diagnosis not present

## 2020-09-11 DIAGNOSIS — N183 Chronic kidney disease, stage 3 unspecified: Secondary | ICD-10-CM | POA: Diagnosis not present

## 2020-09-11 NOTE — Progress Notes (Signed)
EPIC Encounter for ICM Monitoring  Patient Name: Rhonda [name] is a 70 y.o. female Date: 09/11/2020 Primary Care Physican: Maurice Small, MD Primary Cardiologist:Bensimhon Electrophysiologist: Santina Evans Pacing:98.6% 4/20/2022Weight:211lbs   Spoke with patient and reports feeling well at this time.  Denies fluid symptoms.    Optivol thoracic impedancenormal.  Prescribed: Patient participating in paramedicine program to assist with meds.  Torsemide 20 mg3tablets (60 mg total)by mouthtwice a day.  Potassium 20 meq3tablets (60 mEq total) by mouth 3 (three) times daily.Take extra 40 mEq on days you take Metolazone.  Metolazone 2.5mg  1 tabletevery Tuesday.  Spironolactone 25 mg take 1 tablet by mouth every evening.  Labs: 04/09/2020 Creatinine1.44, BUN40, Potassium4.9, [id number]  03/27/2020 Creatinine1.71, BUN33, Potassium3.8, Sodium141, GFR32  03/14/2020 Creatinine1.67, BUN30, Potassium3.4, Sodium139, [id number] A complete set of results can be found in Results Review.  Recommendations:   Follow-up plan: ICM clinic phone appointment on6/10/2020. 91 day device clinic remote transmission5/19/2022.   EP/Cardiology Office Visits: Recall 09/10/2020 with Dr Haroldine Laws.  Recall 07/26/2021 with Dr Lovena Le.  Copy of ICM check sent to Dr.Taylor.    3 month ICM trend: 09/09/2020.    1 Year ICM trend:       [name], RN 09/11/2020 2:54 PM

## 2020-09-12 DIAGNOSIS — R195 Other fecal abnormalities: Secondary | ICD-10-CM | POA: Diagnosis not present

## 2020-09-16 DIAGNOSIS — I5022 Chronic systolic (congestive) heart failure: Secondary | ICD-10-CM | POA: Diagnosis not present

## 2020-09-16 DIAGNOSIS — R519 Headache, unspecified: Secondary | ICD-10-CM | POA: Diagnosis not present

## 2020-09-16 DIAGNOSIS — F32A Depression, unspecified: Secondary | ICD-10-CM | POA: Diagnosis not present

## 2020-09-16 DIAGNOSIS — M48061 Spinal stenosis, lumbar region without neurogenic claudication: Secondary | ICD-10-CM | POA: Diagnosis not present

## 2020-09-16 DIAGNOSIS — J449 Chronic obstructive pulmonary disease, unspecified: Secondary | ICD-10-CM | POA: Diagnosis not present

## 2020-09-16 DIAGNOSIS — G4700 Insomnia, unspecified: Secondary | ICD-10-CM | POA: Diagnosis not present

## 2020-09-16 DIAGNOSIS — N183 Chronic kidney disease, stage 3 unspecified: Secondary | ICD-10-CM | POA: Diagnosis not present

## 2020-09-16 DIAGNOSIS — E1140 Type 2 diabetes mellitus with diabetic neuropathy, unspecified: Secondary | ICD-10-CM | POA: Diagnosis not present

## 2020-09-16 DIAGNOSIS — I130 Hypertensive heart and chronic kidney disease with heart failure and stage 1 through stage 4 chronic kidney disease, or unspecified chronic kidney disease: Secondary | ICD-10-CM | POA: Diagnosis not present

## 2020-09-17 ENCOUNTER — Other Ambulatory Visit (HOSPITAL_COMMUNITY): Payer: Self-pay

## 2020-09-17 NOTE — Progress Notes (Signed)
Paramedicine Encounter    Patient ID: Rhonda [name], female    DOB: 09/23/1950, 70 y.o.   MRN: [id number]   Patient Care Team: Maurice Small, MD as PCP - General (Family Medicine) [name], LCSW as Social Worker (Licensed Clinical Social Worker) [name], [name], DO as Consulting Physician (Neurology)  Patient Active Problem List   Diagnosis Date Noted  . Abnormal gait 06/11/2020  . Adult failure to thrive syndrome 06/11/2020  . Allergic rhinitis 06/11/2020  . Anxiety 06/11/2020  . Asthma 06/11/2020  . Benign intracranial hypertension 06/11/2020  . Body mass index (BMI) 45.0-49.9, adult ([place]) 06/11/2020  . Bowel incontinence 06/11/2020  . Cholelithiasis 06/11/2020  . Chronic sinusitis 06/11/2020  . 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  . Daytime somnolence 06/11/2020  . Diarrhea of presumed infectious origin 06/11/2020  . Edema 06/11/2020  . Family history of malignant neoplasm of gastrointestinal tract 06/11/2020  . Gout 06/11/2020  . History of fall 06/11/2020  . History of infectious disease 06/11/2020  . Insomnia 06/11/2020  . Irregular bowel habits 06/11/2020  . 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 Lumbar spondylosis with myelopathy 06/11/2020  . Macrocytosis 06/11/2020  . Mild recurrent major depression ([place]) 06/11/2020  . Personal history of colonic polyps 06/11/2020  . Personal history of malignant neoplasm of breast 06/11/2020  . Recurrent falls 06/11/2020  . Mixed anxiety and depressive disorder 06/11/2020  . Irritable bowel syndrome 01/04/2020  . Spinal stenosis of lumbar region 01/03/2020  . Diabetes mellitus with neuropathy ([place]) 06/15/2019  . Hypokalemia 11/08/2018  . CKD (chronic kidney disease) stage 3, GFR 30-59 ml/min (HCC) 11/08/2018  . Orthostatic hypotension 07/28/2017  . SVD (spontaneous vaginal delivery)   . Peripheral neuropathy   . On home oxygen therapy   . Migraines   . Left bundle branch block   . Hypothyroidism   . Hypertension   . Hyperlipidemia    . Heart murmur   . GERD (gastroesophageal reflux disease)   . Exertional shortness of breath   . Major depressive disorder   . Back pain   . Arthritis   . Generalized anxiety disorder   . Anemia   . Chronic respiratory failure ([place]) 09/14/2013  . 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 Morbid obesity ([place]) 11/01/2012  . Chronic systolic heart failure ([place]) 10/27/2012  . Endometrial polyp 01/20/2012  . Malignant tumor of breast ([place]) 03/26/2011  . Unspecified vitamin D deficiency 03/26/2011    Current Outpatient Medications:  .  acetaZOLAMIDE (DIAMOX) 125 MG tablet, TAKE 1 TABLET(125 MG) BY MOUTH DAILY, Disp: 90 tablet, Rfl: 3 .  carvedilol (COREG) 3.125 MG tablet, TAKE 1 TABLET (3.125 MG) BY MOUTH TWICE DAILY WITH MEALS, Disp: 180 tablet, Rfl: 3 .  citalopram (CELEXA) 20 MG tablet, Take 20 mg by mouth daily., Disp: , Rfl:  .  dicyclomine (BENTYL) 20 MG tablet, Take 20 mg by mouth every 6 (six) hours., Disp: , Rfl:  .  DULoxetine (CYMBALTA) 30 MG capsule, Take 30 mg by mouth daily. Take once in the morning., Disp: , Rfl:  .  fexofenadine (ALLEGRA) 180 MG tablet, Take 180 mg by mouth 2 (two) times daily. (Patient not taking: No sig reported), Disp: , Rfl:  .  fludrocortisone (FLORINEF) 0.1 MG tablet, TAKE 1 TABLET TWICE DAILY, Disp: 180 tablet, Rfl: 1 .  gabapentin (NEURONTIN) 300 MG capsule, Take 300 mg by mouth 3 (three) times daily., Disp: , Rfl:  .  gabapentin (NEURONTIN) 800 MG tablet, TAKE 1 TABLET AT BEDTIME  AS NEEDED FOR NEUROPATHY, Disp: 90 tablet, Rfl: 0 .  Ginkgo Biloba 120 MG TABS, Take by mouth., Disp: , Rfl:  .  hydrOXYzine (ATARAX/VISTARIL) 25 MG tablet, Take 25 mg by mouth 2 (two) times daily. Takes Morning and Bedtime (Patient not taking: No sig reported), Disp: , Rfl:  .  hydrOXYzine (VISTARIL) 25 MG capsule, Take 25 mg by mouth 3 (three) times daily as needed. (Patient not taking: No sig reported), Disp: , Rfl:  .  ipratropium  (ATROVENT) 0.03 % nasal spray, Place 2 sprays into both nostrils 2 (two) times daily., Disp: , Rfl:  .  levothyroxine (SYNTHROID) 88 MCG tablet, TAKE 1 TABLET(88 MCG) BY MOUTH DAILY BEFORE BREAKFAST, Disp: 90 tablet, Rfl: 3 .  metolazone (ZAROXOLYN) 2.5 MG tablet, TAKE 1 TABLET BY MOUTH EVERY TUESDAY, Disp: 12 tablet, Rfl: 3 .  midodrine (PROAMATINE) 10 MG tablet, TAKE 1 TABLET (10 MG TOTAL) BY MOUTH 3 (THREE) TIMES DAILY WITH MEALS., Disp: 270 tablet, Rfl: 3 .  mirtazapine (REMERON) 30 MG tablet, TAKE 1 TABLET BY MOUTH EVERY NIGHT AT BEDTIME, Disp: 30 tablet, Rfl: 3 .  montelukast (SINGULAIR) 10 MG tablet, Take 10 mg by mouth at bedtime. , Disp: , Rfl: 1 .  omeprazole (PRILOSEC) 40 MG capsule, Take 40 mg by mouth daily., Disp: , Rfl:  .  potassium chloride SA (KLOR-CON) 20 MEQ tablet, TAKE 2 TABLETS THREE TIMES DAILY. TAKE AN EXTRA 2 TABLETS ON DAYS YOU TAKE METOLAZONE, Disp: 572 tablet, Rfl: 0 .  probenecid (BENEMID) 500 MG tablet, Take 500 mg by mouth 2 (two) times daily., Disp: , Rfl:  .  simvastatin (ZOCOR) 10 MG tablet, Take 10 mg by mouth every evening., Disp: , Rfl:  .  spironolactone (ALDACTONE) 25 MG tablet, Take 1 tablet (25 mg total) by mouth every evening., Disp: 90 tablet, Rfl: 3 .  torsemide (DEMADEX) 20 MG tablet, Take 3 tablets (60 mg total) by mouth 2 (two) times daily., Disp: 540 tablet, Rfl: 3 .  traMADol (ULTRAM) 50 MG tablet, Take by mouth every 6 (six) hours as needed. (Patient not taking: No sig reported), Disp: , Rfl:  Allergies  Allergen Reactions  . Ceftin Anaphylaxis    Face and throat swell   . Geodon [Ziprasidone Hcl] Hives  . Lisinopril Other (See Comments)    angioedema  . Shellfish Allergy Other (See Comments)    Gout exacerbation  . Allopurinol Nausea Only and Other (See Comments)    weakness  . Ativan [Lorazepam] Itching  . Sulfa Antibiotics Itching  . Ultram [Tramadol Hcl] Itching  . Valium [Diazepam] Other (See Comments)    Patient states that diazepam  doesn't relax, it has the opposite effect.     Social History   Socioeconomic History  . Marital status: Married    Spouse name: Not on file  . Number of children: 2  . Years of education: 63  . Highest education level: Master's degree (e.g., MA, MS, MEng, MEd, MSW, MBA)  Occupational History  . Occupation: retired  Tobacco Use  . Smoking status: Current Some Day Smoker    Packs/day: 0.10    Years: 26.00    Pack years: 2.60    Types: Cigarettes    Last attempt to quit: 05/06/2007    Years since quitting: 13.3  . Smokeless tobacco: Never Used  Vaping Use  . Vaping Use: Never used  Substance and Sexual Activity  . Alcohol use: No  . Drug use: No  . Sexual activity: Yes  Other Topics Concern  . 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  Constitutional:      Appearance: Normal appearance. She is normal weight.  HENT:     Head: Normocephalic.     Nose: Nose normal.     Mouth/Throat:     Mouth: Mucous membranes are moist.     Pharynx: Oropharynx is clear.  Eyes:     Conjunctiva/sclera: Conjunctivae normal.     Pupils: Pupils are equal, round, and reactive to light.  Cardiovascular:     Rate and Rhythm: Normal rate and regular rhythm.     Pulses: Normal pulses.  Pulmonary:     Effort: Pulmonary effort is normal.     Breath sounds: Normal breath sounds.   Abdominal:     General: Abdomen is flat.     Palpations: Abdomen is soft.  Musculoskeletal:        General: Swelling present. Normal range of motion.     Cervical back: Normal range of motion.     Right lower leg: Edema present.     Left lower leg: Edema present.  Skin:    General: Skin is warm and dry.     Capillary Refill: Capillary refill takes less than 2 seconds.  Neurological:     General: No focal deficit present.     Mental Status: She is alert. Mental status is at baseline.  Psychiatric:        Mood and Affect: Mood normal.    Arrived for home visit for Rhonda [name] who reports having excessive itching and that she has been taking Bendaryl over the last 24 hours with some temporary relief. No hives present, lung sounds clear no GI upset involvement. Rhonda [name] reports this happened after she ate pork from [employer]. No tightness in throat or chest noted. Rhonda [name] denied shortness of breath, chest pain, dizziness or trouble sleeping, taking medications. I advised her if this doesn't improve by tomorrow to call her PCP. If symptoms are to worsen she knows to call EMS. I obtained vitals and they are as noted. I reviewed medications and filled one pill box accordingly. Home visit complete. I will see Rhonda [name] in one week. I will call tomorrow to reassess her itching symptoms. Home visit complete.   Refills: NONE     Future Appointments  Date Time Provider [place]  10/10/2020  7:40 AM [facility] DEVICE REMOTES [facility]  10/28/2020  7:10 AM [facility] DEVICE REMOTES [facility]  01/09/2021  7:40 AM [facility] DEVICE REMOTES [facility]  02/27/2021  9:50 AM [name], Donika K, DO [facility] None  04/10/2021  7:40 AM [facility] DEVICE REMOTES [facility]  07/10/2021  7:40 AM [facility] DEVICE REMOTES [facility]  08/11/2021  7:40 AM [facility]ICE REMOTES [facility]  09/10/2021  7:40 AM [facility] DEVICE  REMOTES [facility]     ACTION: Home visit completed Next visit planned for one week

## 2020-09-24 ENCOUNTER — Other Ambulatory Visit (HOSPITAL_COMMUNITY): Payer: Self-pay

## 2020-09-24 NOTE — Progress Notes (Signed)
Paramedicine Encounter    Patient ID: Rhonda [name], female    DOB: 09/23/1950, 70 y.o.   MRN: [id number]   Patient Care Team: Maurice Small, MD as PCP - General (Family Medicine) [name], LCSW as Social Worker (Licensed Clinical Social Worker) [name], [name], DO as Consulting Physician (Neurology)  Patient Active Problem List   Diagnosis Date Noted  . Abnormal gait 06/11/2020  . Adult failure to thrive syndrome 06/11/2020  . Allergic rhinitis 06/11/2020  . Anxiety 06/11/2020  . Asthma 06/11/2020  . Benign intracranial hypertension 06/11/2020  . Body mass index (BMI) 45.0-49.9, adult ([place]) 06/11/2020  . Bowel incontinence 06/11/2020  . Cholelithiasis 06/11/2020  . Chronic sinusitis 06/11/2020  . 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  . Daytime somnolence 06/11/2020  . Diarrhea of presumed infectious origin 06/11/2020  . Edema 06/11/2020  . Family history of malignant neoplasm of gastrointestinal tract 06/11/2020  . Gout 06/11/2020  . History of fall 06/11/2020  . History of infectious disease 06/11/2020  . Insomnia 06/11/2020  . Irregular bowel habits 06/11/2020  . 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 Lumbar spondylosis with myelopathy 06/11/2020  . Macrocytosis 06/11/2020  . Mild recurrent major depression ([place]) 06/11/2020  . Personal history of colonic polyps 06/11/2020  . Personal history of malignant neoplasm of breast 06/11/2020  . Recurrent falls 06/11/2020  . Mixed anxiety and depressive disorder 06/11/2020  . Irritable bowel syndrome 01/04/2020  . Spinal stenosis of lumbar region 01/03/2020  . Diabetes mellitus with neuropathy ([place]) 06/15/2019  . Hypokalemia 11/08/2018  . CKD (chronic kidney disease) stage 3, GFR 30-59 ml/min (HCC) 11/08/2018  . Orthostatic hypotension 07/28/2017  . SVD (spontaneous vaginal delivery)   . Peripheral neuropathy   . On home oxygen therapy   . Migraines   . Left bundle branch block   . Hypothyroidism   . Hypertension   . Hyperlipidemia    . Heart murmur   . GERD (gastroesophageal reflux disease)   . Exertional shortness of breath   . Major depressive disorder   . Back pain   . Arthritis   . Generalized anxiety disorder   . Anemia   . Chronic respiratory failure ([place]) 09/14/2013  . 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 Morbid obesity ([place]) 11/01/2012  . Chronic systolic heart failure ([place]) 10/27/2012  . Endometrial polyp 01/20/2012  . Malignant tumor of breast ([place]) 03/26/2011  . Unspecified vitamin D deficiency 03/26/2011    Current Outpatient Medications:  .  acetaZOLAMIDE (DIAMOX) 125 MG tablet, TAKE 1 TABLET(125 MG) BY MOUTH DAILY, Disp: 90 tablet, Rfl: 3 .  carvedilol (COREG) 3.125 MG tablet, TAKE 1 TABLET (3.125 MG) BY MOUTH TWICE DAILY WITH MEALS, Disp: 180 tablet, Rfl: 3 .  citalopram (CELEXA) 20 MG tablet, Take 20 mg by mouth daily., Disp: , Rfl:  .  dicyclomine (BENTYL) 20 MG tablet, Take 20 mg by mouth every 6 (six) hours., Disp: , Rfl:  .  DULoxetine (CYMBALTA) 30 MG capsule, Take 30 mg by mouth daily. Take once in the morning., Disp: , Rfl:  .  fexofenadine (ALLEGRA) 180 MG tablet, Take 180 mg by mouth 2 (two) times daily. (Patient not taking: No sig reported), Disp: , Rfl:  .  fludrocortisone (FLORINEF) 0.1 MG tablet, TAKE 1 TABLET TWICE DAILY, Disp: 180 tablet, Rfl: 1 .  gabapentin (NEURONTIN) 300 MG capsule, Take 300 mg by mouth 3 (three) times daily., Disp: , Rfl:  .  gabapentin (NEURONTIN) 800 MG tablet, TAKE 1 TABLET AT BEDTIME  AS NEEDED FOR NEUROPATHY, Disp: 90 tablet, Rfl: 0 .  Ginkgo Biloba 120 MG TABS, Take by mouth., Disp: , Rfl:  .  hydrOXYzine (ATARAX/VISTARIL) 25 MG tablet, Take 25 mg by mouth 2 (two) times daily. Takes Morning and Bedtime (Patient not taking: No sig reported), Disp: , Rfl:  .  hydrOXYzine (VISTARIL) 25 MG capsule, Take 25 mg by mouth 3 (three) times daily as needed. (Patient not taking: No sig reported), Disp: , Rfl:  .  ipratropium  (ATROVENT) 0.03 % nasal spray, Place 2 sprays into both nostrils 2 (two) times daily., Disp: , Rfl:  .  levothyroxine (SYNTHROID) 88 MCG tablet, TAKE 1 TABLET(88 MCG) BY MOUTH DAILY BEFORE BREAKFAST, Disp: 90 tablet, Rfl: 3 .  metolazone (ZAROXOLYN) 2.5 MG tablet, TAKE 1 TABLET BY MOUTH EVERY TUESDAY, Disp: 12 tablet, Rfl: 3 .  midodrine (PROAMATINE) 10 MG tablet, TAKE 1 TABLET (10 MG TOTAL) BY MOUTH 3 (THREE) TIMES DAILY WITH MEALS., Disp: 270 tablet, Rfl: 3 .  mirtazapine (REMERON) 30 MG tablet, TAKE 1 TABLET BY MOUTH EVERY NIGHT AT BEDTIME, Disp: 30 tablet, Rfl: 3 .  montelukast (SINGULAIR) 10 MG tablet, Take 10 mg by mouth at bedtime. , Disp: , Rfl: 1 .  omeprazole (PRILOSEC) 40 MG capsule, Take 40 mg by mouth daily., Disp: , Rfl:  .  potassium chloride SA (KLOR-CON) 20 MEQ tablet, TAKE 2 TABLETS THREE TIMES DAILY. TAKE AN EXTRA 2 TABLETS ON DAYS YOU TAKE METOLAZONE, Disp: 572 tablet, Rfl: 0 .  probenecid (BENEMID) 500 MG tablet, Take 500 mg by mouth 2 (two) times daily., Disp: , Rfl:  .  simvastatin (ZOCOR) 10 MG tablet, Take 10 mg by mouth every evening., Disp: , Rfl:  .  spironolactone (ALDACTONE) 25 MG tablet, Take 1 tablet (25 mg total) by mouth every evening., Disp: 90 tablet, Rfl: 3 .  torsemide (DEMADEX) 20 MG tablet, Take 3 tablets (60 mg total) by mouth 2 (two) times daily., Disp: 540 tablet, Rfl: 3 .  traMADol (ULTRAM) 50 MG tablet, Take by mouth every 6 (six) hours as needed. (Patient not taking: No sig reported), Disp: , Rfl:  Allergies  Allergen Reactions  . Ceftin Anaphylaxis    Face and throat swell   . Geodon [Ziprasidone Hcl] Hives  . Lisinopril Other (See Comments)    angioedema  . Shellfish Allergy Other (See Comments)    Gout exacerbation  . Allopurinol Nausea Only and Other (See Comments)    weakness  . Ativan [Lorazepam] Itching  . Sulfa Antibiotics Itching  . Ultram [Tramadol Hcl] Itching  . Valium [Diazepam] Other (See Comments)    Patient states that diazepam  doesn't relax, it has the opposite effect.     Social History   Socioeconomic History  . Marital status: Married    Spouse name: Not on file  . Number of children: 2  . Years of education: 1  . Highest education level: Master's degree (e.g., MA, MS, MEng, MEd, MSW, MBA)  Occupational History  . Occupation: retired  Tobacco Use  . Smoking status: Current Some Day Smoker    Packs/day: 0.10    Years: 26.00    Pack years: 2.60    Types: Cigarettes    Last attempt to quit: 05/06/2007    Years since quitting: 13.3  . Smokeless tobacco: Never Used  Vaping Use  . Vaping Use: Never used  Substance and Sexual Activity  . Alcohol use: No  . Drug use: No  . Sexual activity: Yes  Other Topics Concern  . 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  Constitutional:      Appearance: Normal appearance. She is normal weight.  HENT:     Head: Normocephalic.     Nose: Nose normal.     Mouth/Throat:     Mouth: Mucous membranes are moist.     Pharynx: Oropharynx is clear.  Eyes:     Conjunctiva/sclera: Conjunctivae normal.     Pupils: Pupils are equal, round, and reactive to light.  Cardiovascular:     Rate and Rhythm: Normal rate and regular rhythm.     Pulses: Normal pulses.     Heart sounds: Normal heart sounds.  Pulmonary:     Effort: Pulmonary effort is normal.      Breath sounds: Normal breath sounds.  Abdominal:     General: Abdomen is flat.     Palpations: Abdomen is soft.  Musculoskeletal:        General: Swelling present. Normal range of motion.     Cervical back: Normal range of motion.     Right lower leg: Edema present.     Left lower leg: Edema present.  Skin:    General: Skin is warm and dry.     Capillary Refill: Capillary refill takes less than 2 seconds.  Neurological:     General: No focal deficit present.     Mental Status: She is alert. Mental status is at baseline.  Psychiatric:        Mood and Affect: Mood normal.    Arrived for home visit for Rhonda [name] who was alert and oriented ambulating around her home with no reports of chest pain, shortness of breath, dizziness or trouble taking her medications. I obtained vitals and assessment as noted. Rhonda [name] was complaining of leg, buttock and lower back pain which is chronic for her and she has prescribed pain medication in which she takes but not as often as prescribed to avoid addiction. I advised her to try Tylenol if she wants to avoid the narcotic. She agreed with plan. Rhonda [name] also expressed concerns of needing a podiatry appointment because her left big toe nail is large in size and needs to be trimmed professionally and filed down. I will assist with planning appointment with [facility]. Lower leg swelling present but no more than normal. Rhonda [name] reports she has been feeling okay otherwise. I reviewed medications filling one pill box accordingly. Rhonda [name] had clear and equal lung sounds. I reviewed appointments with Rhonda [name] and will plan to be back out in one week. Home visit complete.   Refills: NONE    Last weight- 213lbs Todays weight- 209lbs     Future Appointments  Date Time Provider [place]  10/10/2020  7:40 AM [facility] DEVICE REMOTES [facility]  10/28/2020  7:10 AM [facility] DEVICE REMOTES [facility]  01/09/2021  7:40 AM [facility] DEVICE  REMOTES [facility]  02/27/2021  9:50 AM [name], Donika K, DO [facility] None  04/10/2021  7:40 AM [facility] DEVICE REMOTES [facility]  07/10/2021  7:40 AM [facility] DEVICE REMOTES [facility]  08/11/2021  7:40 AM [facility] DEVICE REMOTES [facility]  09/10/2021  7:40 AM [facility] DEVICE REMOTES [facility]     ACTION: Home visit completed Next visit planned for one week

## 2020-10-01 ENCOUNTER — Other Ambulatory Visit (HOSPITAL_COMMUNITY): Payer: Self-pay

## 2020-10-01 NOTE — Progress Notes (Signed)
Paramedicine Encounter    Patient ID: Rhonda [name], female    DOB: 09/23/1950, 70 y.o.   MRN: [id number]   Patient Care Team: Maurice Small, MD as PCP - General (Family Medicine) [name], LCSW as Social Worker (Licensed Clinical Social Worker) [name], [name], DO as Consulting Physician (Neurology)  Patient Active Problem List   Diagnosis Date Noted  . Abnormal gait 06/11/2020  . Adult failure to thrive syndrome 06/11/2020  . Allergic rhinitis 06/11/2020  . Anxiety 06/11/2020  . Asthma 06/11/2020  . Benign intracranial hypertension 06/11/2020  . Body mass index (BMI) 45.0-49.9, adult ([place]) 06/11/2020  . Bowel incontinence 06/11/2020  . Cholelithiasis 06/11/2020  . Chronic sinusitis 06/11/2020  . 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  . Daytime somnolence 06/11/2020  . Diarrhea of presumed infectious origin 06/11/2020  . Edema 06/11/2020  . Family history of malignant neoplasm of gastrointestinal tract 06/11/2020  . Gout 06/11/2020  . History of fall 06/11/2020  . History of infectious disease 06/11/2020  . Insomnia 06/11/2020  . Irregular bowel habits 06/11/2020  . 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 Lumbar spondylosis with myelopathy 06/11/2020  . Macrocytosis 06/11/2020  . Mild recurrent major depression ([place]) 06/11/2020  . Personal history of colonic polyps 06/11/2020  . Personal history of malignant neoplasm of breast 06/11/2020  . Recurrent falls 06/11/2020  . Mixed anxiety and depressive disorder 06/11/2020  . Irritable bowel syndrome 01/04/2020  . Spinal stenosis of lumbar region 01/03/2020  . Diabetes mellitus with neuropathy ([place]) 06/15/2019  . Hypokalemia 11/08/2018  . CKD (chronic kidney disease) stage 3, GFR 30-59 ml/min (HCC) 11/08/2018  . Orthostatic hypotension 07/28/2017  . SVD (spontaneous vaginal delivery)   . Peripheral neuropathy   . On home oxygen therapy   . Migraines   . Left bundle branch block   . Hypothyroidism   . Hypertension   . Hyperlipidemia    . Heart murmur   . GERD (gastroesophageal reflux disease)   . Exertional shortness of breath   . Major depressive disorder   . Back pain   . Arthritis   . Generalized anxiety disorder   . Anemia   . Chronic respiratory failure ([place]) 09/14/2013  . 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 Morbid obesity ([place]) 11/01/2012  . Chronic systolic heart failure ([place]) 10/27/2012  . Endometrial polyp 01/20/2012  . Malignant tumor of breast ([place]) 03/26/2011  . Unspecified vitamin D deficiency 03/26/2011    Current Outpatient Medications:  .  acetaZOLAMIDE (DIAMOX) 125 MG tablet, TAKE 1 TABLET(125 MG) BY MOUTH DAILY, Disp: 90 tablet, Rfl: 3 .  carvedilol (COREG) 3.125 MG tablet, TAKE 1 TABLET (3.125 MG) BY MOUTH TWICE DAILY WITH MEALS, Disp: 180 tablet, Rfl: 3 .  citalopram (CELEXA) 20 MG tablet, Take 20 mg by mouth daily., Disp: , Rfl:  .  dicyclomine (BENTYL) 20 MG tablet, Take 20 mg by mouth every 6 (six) hours., Disp: , Rfl:  .  DULoxetine (CYMBALTA) 30 MG capsule, Take 30 mg by mouth daily. Take once in the morning., Disp: , Rfl:  .  fexofenadine (ALLEGRA) 180 MG tablet, Take 180 mg by mouth 2 (two) times daily. (Patient not taking: No sig reported), Disp: , Rfl:  .  fludrocortisone (FLORINEF) 0.1 MG tablet, TAKE 1 TABLET TWICE DAILY, Disp: 180 tablet, Rfl: 1 .  gabapentin (NEURONTIN) 300 MG capsule, Take 300 mg by mouth 3 (three) times daily., Disp: , Rfl:  .  gabapentin (NEURONTIN) 800 MG tablet, TAKE 1 TABLET AT BEDTIME  AS NEEDED FOR NEUROPATHY, Disp: 90 tablet, Rfl: 0 .  Ginkgo Biloba 120 MG TABS, Take by mouth., Disp: , Rfl:  .  hydrOXYzine (ATARAX/VISTARIL) 25 MG tablet, Take 25 mg by mouth 2 (two) times daily. Takes Morning and Bedtime (Patient not taking: No sig reported), Disp: , Rfl:  .  hydrOXYzine (VISTARIL) 25 MG capsule, Take 25 mg by mouth 3 (three) times daily as needed. (Patient not taking: No sig reported), Disp: , Rfl:  .  ipratropium  (ATROVENT) 0.03 % nasal spray, Place 2 sprays into both nostrils 2 (two) times daily., Disp: , Rfl:  .  levothyroxine (SYNTHROID) 88 MCG tablet, TAKE 1 TABLET(88 MCG) BY MOUTH DAILY BEFORE BREAKFAST, Disp: 90 tablet, Rfl: 3 .  metolazone (ZAROXOLYN) 2.5 MG tablet, TAKE 1 TABLET BY MOUTH EVERY TUESDAY, Disp: 12 tablet, Rfl: 3 .  midodrine (PROAMATINE) 10 MG tablet, TAKE 1 TABLET (10 MG TOTAL) BY MOUTH 3 (THREE) TIMES DAILY WITH MEALS., Disp: 270 tablet, Rfl: 3 .  mirtazapine (REMERON) 30 MG tablet, TAKE 1 TABLET BY MOUTH EVERY NIGHT AT BEDTIME, Disp: 30 tablet, Rfl: 3 .  montelukast (SINGULAIR) 10 MG tablet, Take 10 mg by mouth at bedtime. , Disp: , Rfl: 1 .  omeprazole (PRILOSEC) 40 MG capsule, Take 40 mg by mouth daily., Disp: , Rfl:  .  potassium chloride SA (KLOR-CON) 20 MEQ tablet, TAKE 2 TABLETS THREE TIMES DAILY. TAKE AN EXTRA 2 TABLETS ON DAYS YOU TAKE METOLAZONE, Disp: 572 tablet, Rfl: 0 .  probenecid (BENEMID) 500 MG tablet, Take 500 mg by mouth 2 (two) times daily., Disp: , Rfl:  .  simvastatin (ZOCOR) 10 MG tablet, Take 10 mg by mouth every evening., Disp: , Rfl:  .  spironolactone (ALDACTONE) 25 MG tablet, Take 1 tablet (25 mg total) by mouth every evening., Disp: 90 tablet, Rfl: 3 .  torsemide (DEMADEX) 20 MG tablet, Take 3 tablets (60 mg total) by mouth 2 (two) times daily., Disp: 540 tablet, Rfl: 3 .  traMADol (ULTRAM) 50 MG tablet, Take by mouth every 6 (six) hours as needed. (Patient not taking: No sig reported), Disp: , Rfl:  Allergies  Allergen Reactions  . Ceftin Anaphylaxis    Face and throat swell   . Geodon [Ziprasidone Hcl] Hives  . Lisinopril Other (See Comments)    angioedema  . Shellfish Allergy Other (See Comments)    Gout exacerbation  . Allopurinol Nausea Only and Other (See Comments)    weakness  . Ativan [Lorazepam] Itching  . Sulfa Antibiotics Itching  . Ultram [Tramadol Hcl] Itching  . Valium [Diazepam] Other (See Comments)    Patient states that diazepam  doesn't relax, it has the opposite effect.     Social History   Socioeconomic History  . Marital status: Married    Spouse name: Not on file  . Number of children: 2  . Years of education: 69  . Highest education level: Master's degree (e.g., MA, MS, MEng, MEd, MSW, MBA)  Occupational History  . Occupation: retired  Tobacco Use  . Smoking status: Current Some Day Smoker    Packs/day: 0.10    Years: 26.00    Pack years: 2.60    Types: Cigarettes    Last attempt to quit: 05/06/2007    Years since quitting: 13.4  . Smokeless tobacco: Never Used  Vaping Use  . Vaping Use: Never used  Substance and Sexual Activity  . Alcohol use: No  . Drug use: No  . Sexual activity: Yes  Other Topics Concern  . 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  Constitutional:      Appearance: Normal appearance. She is normal weight.  HENT:     Head: Normocephalic.     Nose: Nose normal.     Mouth/Throat:     Mouth: Mucous membranes are moist.     Pharynx: Oropharynx is clear.  Eyes:     Conjunctiva/sclera: Conjunctivae normal.     Pupils: Pupils are equal, round, and reactive to light.  Cardiovascular:     Rate and Rhythm: Normal rate and regular rhythm.     Pulses: Normal pulses.     Heart sounds: Normal heart sounds.  Pulmonary:     Effort: Pulmonary effort is normal.      Breath sounds: Normal breath sounds.  Abdominal:     General: Abdomen is flat.     Palpations: Abdomen is soft.  Musculoskeletal:        General: Swelling present. Normal range of motion.     Cervical back: Normal range of motion.     Right lower leg: Edema present.     Left lower leg: Edema present.  Skin:    General: Skin is warm and dry.     Capillary Refill: Capillary refill takes less than 2 seconds.  Neurological:     General: No focal deficit present.     Mental Status: She is alert. Mental status is at baseline.  Psychiatric:        Mood and Affect: Mood normal.     Arrived for home visit for [facility] who was alert and oriented with no reports of complaints upon assessment. Rhonda [name] had some lower leg swelling and edema which is normal for her. They are no worse than her normal upon assessment. I obtained vitals and they are as noted. I reviewed medications and filled two weeks of pill boxes for Rhonda [name]. We reviewed upcoming appointments and confirmed same. Rhonda [name] denied any chest pain, shortness of breath, dizziness or trouble with med compliance over the last week. Home visit complete and home visit planned for two weeks.   Refills: Fludrocortisone Singulair     Future Appointments  Date Time Provider [place]  10/02/2020  3:45 PM Marzetta Board, DPM [facility]  10/10/2020  7:40 AM [facility] DEVICE REMOTES [facility]  10/28/2020  7:10 AM [facility] DEVICE REMOTES [facility]  01/09/2021  7:40 AM [facility] DEVICE REMOTES [facility]  02/27/2021  9:50 AM [name], Donika K, DO [facility] None  04/10/2021  7:40 AM [facility] DEVICE REMOTES [facility]  07/10/2021  7:40 AM [facility] DEVICE REMOTES [facility]  08/11/2021  7:40 AM [facility] DEVICE REMOTES [facility]  09/10/2021  7:40 AM [facility] DEVICE REMOTES [facility]  ACTION: Home visit  completed Next visit planned for two weeks

## 2020-10-02 ENCOUNTER — Ambulatory Visit: Payer: Medicare HMO | Admitting: Podiatry

## 2020-10-02 ENCOUNTER — Encounter: Payer: Self-pay | Admitting: Podiatry

## 2020-10-02 ENCOUNTER — Other Ambulatory Visit: Payer: Self-pay

## 2020-10-02 DIAGNOSIS — M79675 Pain in left toe(s): Secondary | ICD-10-CM

## 2020-10-02 DIAGNOSIS — L853 Xerosis cutis: Secondary | ICD-10-CM | POA: Diagnosis not present

## 2020-10-02 DIAGNOSIS — M79674 Pain in right toe(s): Secondary | ICD-10-CM

## 2020-10-02 DIAGNOSIS — E1142 Type 2 diabetes mellitus with diabetic polyneuropathy: Secondary | ICD-10-CM

## 2020-10-02 DIAGNOSIS — B351 Tinea unguium: Secondary | ICD-10-CM

## 2020-10-02 NOTE — Patient Instructions (Signed)
For extremely dry, cracked feet: moisturize feet once daily; do not apply between toes A. CeraVe Healing Ointment B. Aquaphor Healing Ointment C. Vaseline Petroleum Healing Jelly   If you have problems reaching your feet: apply to feet once daily; do not apply between toes A.  Aquaphor Advanced Therapy Ointment Body Spray B.  Vaseline Intensive Care Spray Lotion Advanced Repair   

## 2020-10-08 ENCOUNTER — Other Ambulatory Visit (HOSPITAL_COMMUNITY): Payer: Self-pay

## 2020-10-08 NOTE — Progress Notes (Signed)
  Subjective:  Patient ID: Rhonda [name], female    DOB: 02-20-1951,  MRN: [id number]  70 y.o. female presents at risk foot care with history of diabetic neuropathy and painful thick toenails that are difficult to trim. Pain interferes with ambulation. Aggravating factors include wearing enclosed shoe gear. Pain is relieved with periodic professional debridement.   PCP is Dr. Maurice Small and last visit was 09/12/2020. Her husband is present during today's visit.  Allergies  Allergen Reactions  . Ceftin Anaphylaxis    Face and throat swell   . Geodon [Ziprasidone Hcl] Hives  . Lisinopril Other (See Comments)    angioedema  . Shellfish Allergy Other (See Comments)    Gout exacerbation  . Shellfish-Derived Products     Other reaction(s): Other  . Ultram [Tramadol] Itching  . Allopurinol Nausea Only and Other (See Comments)    weakness  . Ativan [Lorazepam] Itching  . Sulfa Antibiotics Itching  . Ultram [Tramadol Hcl] Itching  . Valium [Diazepam] Other (See Comments)    Patient states that diazepam doesn't relax, it has the opposite effect.    Review of Systems: Negative except as noted in the HPI.   Objective:   Constitutional Pt is a pleasant 70 y.o. African American female in NAD. AAO x 3.   Vascular Capillary refill time to digits immediate b/l. Faintly palpable pedal pulses b/l. Pedal hair absent. Lower extremity skin temperature gradient within normal limits.  Neurologic Protective sensation diminished with 10g monofilament b/l. Vibratory sensation diminished b/l. Clonus negative b/l.  Dermatologic Pedal skin with normal turgor, texture and tone bilaterally. No open wounds bilaterally. No interdigital macerations bilaterally. Toenails 1-5 b/l elongated, discolored, dystrophic, thickened, crumbly with subungual debris and tenderness to dorsal palpation. No hyperkeratotic nor porokeratotic lesions present on today's visit. Pedal skin noted to be dry and flaky b/l lower extremities.   Orthopedic: Normal muscle strength 5/5 to all lower extremity muscle groups bilaterally. Decreased ankle joint ROM b/l. Pes planus deformity noted b/l.    Radiographs: None Assessment:   1. Pain due to onychomycosis of toenails of both feet   2. Xerosis cutis   3. Diabetic peripheral neuropathy associated with type 2 diabetes mellitus ([place])     Plan:  Patient was evaluated and treated and all questions answered.  Onychomycosis with pain -Nails palliatively debridement as below. -Educated on self-care  Procedure: Nail Debridement Rationale: Pain Type of Debridement: manual, sharp debridement. Instrumentation: Nail nipper, rotary burr. Number of Nails: 10  -Examined patient. -Continue diabetic foot care principles. -Patient to continue soft, supportive shoe gear daily. -Toenails 1-5 b/l were debrided in length and girth with sterile nail nippers and dremel without iatrogenic bleeding.  -Patient to report any pedal injuries to medical professional immediately. -For dry skin, patient was given written list of OTC moisturizers. Patient/POA instructed to apply to foot/feet once daily avoiding application between toes.  -Patient/POA to call should there be question/concern in the interim.  Return in about 3 months (around 01/02/2021).  Marzetta Board, DPM

## 2020-10-08 NOTE — Progress Notes (Signed)
Paramedicine Encounter    Patient ID: Rhonda [name], female    DOB: 07-Mar-1951, 71 y.o.   MRN: [id number]    Met with Rhonda [name] today for a pill box check. I refilled total of two pill boxes. I will see Rhonda [name] in two weeks for a full visit.   Rhonda [name] had no complaints today, WEIGHT 211lbs after eating two days of chinese food. I expressed to Rhonda [name] the importance of watching her salt intake and decreasing the amount of take out she eats. She verbalized understanding and was given a list of items that were best for her to be eating. Rhonda [name] agreed with plan. I will see her again in two weeks.     ACTION: Home visit completed Next visit planned for two weeks

## 2020-10-10 ENCOUNTER — Ambulatory Visit (INDEPENDENT_AMBULATORY_CARE_PROVIDER_SITE_OTHER): Payer: Medicare HMO

## 2020-10-10 DIAGNOSIS — I428 Other cardiomyopathies: Secondary | ICD-10-CM | POA: Diagnosis not present

## 2020-10-14 LAB — CUP PACEART REMOTE DEVICE CHECK
Battery Remaining Longevity: 89 mo
Battery Voltage: 3 V
Brady Statistic AP VP Percent: 0.24 %
Brady Statistic AP VS Percent: 0.01 %
Brady Statistic AS VP Percent: 98.6 %
Brady Statistic AS VS Percent: 1.15 %
Brady Statistic RA Percent Paced: 0.25 %
Brady Statistic RV Percent Paced: 69.81 %
Date Time Interrogation Session: 20220519061708
HighPow Impedance: 62 Ohm
Implantable Lead Implant Date: 20141210
Implantable Lead Implant Date: 20141210
Implantable Lead Implant Date: 20141210
Implantable Lead Location: 753858
Implantable Lead Location: 753859
Implantable Lead Location: 753860
Implantable Lead Model: 4396
Implantable Lead Model: 5076
Implantable Lead Model: 6935
Implantable Pulse Generator Implant Date: 20211118
Lead Channel Impedance Value: 342 Ohm
Lead Channel Impedance Value: 342 Ohm
Lead Channel Impedance Value: 456 Ohm
Lead Channel Impedance Value: 513 Ohm
Lead Channel Impedance Value: 551 Ohm
Lead Channel Impedance Value: 551 Ohm
Lead Channel Pacing Threshold Amplitude: 0.5 V
Lead Channel Pacing Threshold Amplitude: 0.875 V
Lead Channel Pacing Threshold Amplitude: 1.25 V
Lead Channel Pacing Threshold Pulse Width: 0.4 ms
Lead Channel Pacing Threshold Pulse Width: 0.4 ms
Lead Channel Pacing Threshold Pulse Width: 0.4 ms
Lead Channel Sensing Intrinsic Amplitude: 25.625 mV
Lead Channel Sensing Intrinsic Amplitude: 25.625 mV
Lead Channel Sensing Intrinsic Amplitude: 3.25 mV
Lead Channel Sensing Intrinsic Amplitude: 3.25 mV
Lead Channel Setting Pacing Amplitude: 1.5 V
Lead Channel Setting Pacing Amplitude: 2 V
Lead Channel Setting Pacing Amplitude: 2.5 V
Lead Channel Setting Pacing Pulse Width: 0.4 ms
Lead Channel Setting Pacing Pulse Width: 0.4 ms
Lead Channel Setting Sensing Sensitivity: 0.3 mV

## 2020-10-16 DIAGNOSIS — M4800 Spinal stenosis, site unspecified: Secondary | ICD-10-CM | POA: Diagnosis not present

## 2020-10-22 ENCOUNTER — Other Ambulatory Visit (HOSPITAL_COMMUNITY): Payer: Self-pay

## 2020-10-22 NOTE — Progress Notes (Signed)
Paramedicine Encounter    Patient ID: Rhonda [name], female    DOB: 1950-12-05, 70 y.o.   MRN: [id number]   Patient Care Team: Maurice Small, MD as PCP - General (Family Medicine) [name], LCSW as Social Worker (Licensed Clinical Social Worker) [name], [name], DO as Consulting Physician (Neurology)  Patient Active Problem List   Diagnosis Date Noted  . Abnormal gait 06/11/2020  . Adult failure to thrive syndrome 06/11/2020  . Allergic rhinitis 06/11/2020  . Anxiety 06/11/2020  . Asthma 06/11/2020  . Benign intracranial hypertension 06/11/2020  . Body mass index (BMI) 45.0-49.9, adult ([place]) 06/11/2020  . Bowel incontinence 06/11/2020  . Cholelithiasis 06/11/2020  . Chronic sinusitis 06/11/2020  . 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  . Daytime somnolence 06/11/2020  . Diarrhea of presumed infectious origin 06/11/2020  . Edema 06/11/2020  . Family history of malignant neoplasm of gastrointestinal tract 06/11/2020  . Gout 06/11/2020  . History of fall 06/11/2020  . History of infectious disease 06/11/2020  . Insomnia 06/11/2020  . Irregular bowel habits 06/11/2020  . 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 Lumbar spondylosis with myelopathy 06/11/2020  . Macrocytosis 06/11/2020  . Mild recurrent major depression ([place]) 06/11/2020  . Personal history of colonic polyps 06/11/2020  . Personal history of malignant neoplasm of breast 06/11/2020  . Recurrent falls 06/11/2020  . Mixed anxiety and depressive disorder 06/11/2020  . Irritable bowel syndrome 01/04/2020  . Spinal stenosis of lumbar region 01/03/2020  . Other symptoms and signs involving the musculoskeletal system 09/11/2019  . Bilateral leg weakness 08/01/2019  . Leg swelling 08/01/2019  . Diabetes mellitus with neuropathy ([place]) 06/15/2019  . Hypokalemia 11/08/2018  . CKD (chronic kidney disease) stage 3, GFR 30-59 ml/min (HCC) 11/08/2018  . Orthostatic hypotension 07/28/2017  . SVD (spontaneous vaginal delivery)   . Peripheral  neuropathy   . On home oxygen therapy   . Migraines   . Left bundle branch block   . Hypothyroidism   . Hypertension   . Hyperlipidemia   . Heart murmur   . GERD (gastroesophageal reflux disease)   . Exertional shortness of breath   . Major depressive disorder   . Back pain   . Arthritis   . Generalized anxiety disorder   . Anemia   . Chronic respiratory failure ([place]) 09/14/2013  . 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 Morbid obesity ([place]) 11/01/2012  . Chronic systolic heart failure ([place]) 10/27/2012  . Endometrial polyp 01/20/2012  . Malignant tumor of breast ([place]) 03/26/2011  . Unspecified vitamin D deficiency 03/26/2011    Current Outpatient Medications:  .  acetaZOLAMIDE (DIAMOX) 125 MG tablet, TAKE 1 TABLET(125 MG) BY MOUTH DAILY, Disp: 90 tablet, Rfl: 3 .  amoxicillin-clavulanate (AUGMENTIN) 875-125 MG tablet, , Disp: , Rfl:  .  carvedilol (COREG) 3.125 MG tablet, TAKE 1 TABLET (3.125 MG) BY MOUTH TWICE DAILY WITH MEALS, Disp: 180 tablet, Rfl: 3 .  citalopram (CELEXA) 20 MG tablet, Take 20 mg by mouth daily., Disp: , Rfl:  .  clonazePAM (KLONOPIN) 0.5 MG tablet, , Disp: , Rfl:  .  dicyclomine (BENTYL) 20 MG tablet, 1 tablet, Disp: , Rfl:  .  DULoxetine (CYMBALTA) 30 MG capsule, 1 capsule (Patient not taking: Reported on 10/08/2020), Disp: , Rfl:  .  DULoxetine (CYMBALTA) 60 MG capsule, , Disp: , Rfl:  .  fexofenadine (ALLEGRA) 180 MG tablet, Take 180 mg by mouth 2 (two) times daily. (Patient not taking: No sig reported), Disp: , Rfl:  .  fludrocortisone (FLORINEF) 0.1 MG tablet, 1 tablet, Disp: , Rfl:  .  fluticasone (FLONASE) 50 MCG/ACT nasal spray, , Disp: , Rfl:  .  gabapentin (NEURONTIN) 300 MG capsule, Take 1 capsule by mouth 3 (three) times daily., Disp: , Rfl:  .  gabapentin (NEURONTIN) 800 MG tablet, Take 800 mg by mouth at bedtime., Disp: , Rfl:  .  Ginkgo Biloba 120 MG CAPS, See admin instructions., Disp: , Rfl:  .   HYDROcodone-acetaminophen (NORCO) 10-325 MG tablet, 1 tablet as needed, Disp: , Rfl:  .  hydrOXYzine (ATARAX/VISTARIL) 25 MG tablet, 1 tablet as needed (Patient not taking: Reported on 10/08/2020), Disp: , Rfl:  .  hydrOXYzine (VISTARIL) 25 MG capsule, 1 capsule as needed (Patient not taking: Reported on 10/08/2020), Disp: , Rfl:  .  ipratropium (ATROVENT) 0.03 % nasal spray, Place 2 sprays into both nostrils 2 (two) times daily., Disp: , Rfl:  .  levothyroxine (SYNTHROID) 88 MCG tablet, TAKE 1 TABLET(88 MCG) BY MOUTH DAILY BEFORE BREAKFAST, Disp: 90 tablet, Rfl: 3 .  lidocaine (XYLOCAINE) 2 % jelly, , Disp: , Rfl:  .  metolazone (ZAROXOLYN) 2.5 MG tablet, TAKE 1 TABLET BY MOUTH EVERY TUESDAY, Disp: 12 tablet, Rfl: 3 .  midodrine (PROAMATINE) 10 MG tablet, TAKE 1 TABLET (10 MG TOTAL) BY MOUTH 3 (THREE) TIMES DAILY WITH MEALS., Disp: 270 tablet, Rfl: 3 .  mirtazapine (REMERON) 30 MG tablet, 1 tablet at bedtime., Disp: , Rfl:  .  montelukast (SINGULAIR) 10 MG tablet, Take 10 mg by mouth at bedtime. , Disp: , Rfl: 1 .  omeprazole (PRILOSEC) 40 MG capsule, Take 40 mg by mouth daily., Disp: , Rfl:  .  potassium chloride SA (KLOR-CON) 20 MEQ tablet, TAKE 2 TABLETS THREE TIMES DAILY. TAKE AN EXTRA 2 TABLETS ON DAYS YOU TAKE METOLAZONE, Disp: 572 tablet, Rfl: 0 .  probenecid (BENEMID) 500 MG tablet, Take 500 mg by mouth 2 (two) times daily., Disp: , Rfl:  .  simvastatin (ZOCOR) 10 MG tablet, Take 10 mg by mouth every evening., Disp: , Rfl:  .  spironolactone (ALDACTONE) 25 MG tablet, Take 1 tablet (25 mg total) by mouth every evening., Disp: 90 tablet, Rfl: 3 .  torsemide (DEMADEX) 20 MG tablet, Take 3 tablets (60 mg total) by mouth 2 (two) times daily., Disp: 540 tablet, Rfl: 3 .  traMADol (ULTRAM) 50 MG tablet, 1 tablet as needed, Disp: , Rfl:  Allergies  Allergen Reactions  . Ceftin Anaphylaxis    Face and throat swell   . Geodon [Ziprasidone Hcl] Hives  . Lisinopril Other (See Comments)     angioedema  . Shellfish Allergy Other (See Comments)    Gout exacerbation  . Shellfish-Derived Products     Other reaction(s): Other  . Ultram [Tramadol] Itching  . Allopurinol Nausea Only and Other (See Comments)    weakness  . Ativan [Lorazepam] Itching  . Sulfa Antibiotics Itching  . Ultram [Tramadol Hcl] Itching  . Valium [Diazepam] Other (See Comments)    Patient states that diazepam doesn't relax, it has the opposite effect.     Social History   Socioeconomic History  . Marital status: Married    Spouse name: Not on file  . Number of children: 2  . Years of education: 68  . Highest education level: Master's degree (e.g., MA, MS, MEng, MEd, MSW, MBA)  Occupational History  . Occupation: retired  Tobacco Use  . Smoking status: Current Some Day Smoker    Packs/day: 0.10  Years: 26.00    Pack years: 2.60    Types: Cigarettes    Last attempt to quit: 05/06/2007    Years since quitting: 13.4  . Smokeless tobacco: Never Used  Vaping Use  . Vaping Use: Never used  Substance and Sexual Activity  . Alcohol use: No  . Drug use: No  . Sexual activity: Yes  Other Topics Concern  . 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  Constitutional:      Appearance: Normal appearance. She is normal weight.  HENT:     Head: Normocephalic.     Nose: Nose normal.      Mouth/Throat:     Mouth: Mucous membranes are moist.     Pharynx: Oropharynx is clear.  Eyes:     Conjunctiva/sclera: Conjunctivae normal.     Pupils: Pupils are equal, round, and reactive to light.  Cardiovascular:     Rate and Rhythm: Normal rate and regular rhythm.     Pulses: Normal pulses.     Heart sounds: Normal heart sounds.  Pulmonary:     Effort: Pulmonary effort is normal.     Breath sounds: Normal breath sounds.  Abdominal:     General: Abdomen is flat.     Palpations: Abdomen is soft.  Musculoskeletal:        General: Swelling present. Normal range of motion.     Cervical back: Normal range of motion.     Right lower leg: No edema.     Left lower leg: No edema.  Skin:    General: Skin is warm and dry.     Capillary Refill: Capillary refill takes less than 2 seconds.  Neurological:     General: No focal deficit present.     Mental Status: She is alert. Mental status is at baseline.  Psychiatric:        Mood and Affect: Mood normal.      Arrived for home visit for [facility] who reports to be feeling good. Briseis stated she has been compliant with medications, pill box revealed she missed one evening dose last week. I obtained vitals and assessment as noted. I gave Mrs. Roa her medications for the morning. I filled pill box accordingly. Mrs. Logie had her normal swelling but no pitting edema noted. Lung sounds clear, no JVD. Oriana was reminded of weight and health management tips for dietary restrictions. She verbalized agreement. Home visit complete. I will see her in one week.   Refills: Singulair Gabapentin     Future Appointments  Date Time Provider [place]  10/28/2020  7:10 AM [facility] DEVICE REMOTES [facility]  01/08/2021  3:45 PM Marzetta Board, DPM [facility]  01/09/2021  7:40 AM [facility] DEVICE REMOTES [facility]  02/27/2021  9:50 AM Narda Amber K, DO [facility] None  04/10/2021  7:40 AM  [facility] DEVICE REMOTES [facility]  07/10/2021  7:40 AM [facility] DEVICE REMOTES [facility]  08/11/2021  7:40 AM [facility] DEVICE REMOTES [facility]  09/10/2021  7:40 AM [facility] DEVICE REMOTES [facility]     ACTION: Home visit completed Next visit planned for one week

## 2020-10-23 ENCOUNTER — Other Ambulatory Visit (HOSPITAL_COMMUNITY): Payer: Self-pay | Admitting: Adult Health

## 2020-10-28 ENCOUNTER — Ambulatory Visit (INDEPENDENT_AMBULATORY_CARE_PROVIDER_SITE_OTHER): Payer: Medicare HMO

## 2020-10-28 DIAGNOSIS — I5022 Chronic systolic (congestive) heart failure: Secondary | ICD-10-CM | POA: Diagnosis not present

## 2020-10-28 DIAGNOSIS — Z95810 Presence of automatic (implantable) cardiac defibrillator: Secondary | ICD-10-CM | POA: Diagnosis not present

## 2020-10-29 NOTE — Progress Notes (Signed)
EPIC Encounter for ICM Monitoring  Patient Name: Rhonda [name] is a 70 y.o. female Date: 10/29/2020 Primary Care Physican: Maurice Small, MD Primary Cardiologist:Bensimhon Electrophysiologist: Lovena Le Bi-V Pacing:98.6% 5/31/2022Weight:209lbs   Transmission reviewed.   Optivol thoracic impedancenormal.  Prescribed: Patient participating in paramedicine program to assist with meds.  Torsemide 20 mg3tablets (60 mg total)by mouthtwice a day.  Potassium 20 meq3tablets (60 mEq total) by mouth 3 (three) times daily.Take extra 40 mEq on days you take Metolazone.  Metolazone 2.5mg  1 tabletevery Tuesday.  Spironolactone 25 mg take 1 tablet by mouth every evening.  Labs: 04/09/2020 Creatinine1.44, BUN40, Potassium4.9, [id number]  03/27/2020 Creatinine1.71, BUN33, Potassium3.8, Sodium141, GFR32  03/14/2020 Creatinine1.67, BUN30, Potassium3.4, Sodium139, [id number] A complete set of results can be found in Results Review.  Recommendations:   Follow-up plan: ICM clinic phone appointment on7/03/2021. 91 day device clinic remote transmission8/18/2022.   EP/Cardiology Office Visits: Recall 09/10/2020 with Dr Haroldine Laws. Recall 3/4/2023with Dr Lovena Le.  Copy of ICM check sent to Dr.Taylor.   3 month ICM trend: 10/28/2020.    1 Year ICM trend:       [name], RN 10/29/2020 3:14 PM

## 2020-10-30 ENCOUNTER — Telehealth (HOSPITAL_COMMUNITY): Payer: Self-pay

## 2020-10-30 NOTE — Telephone Encounter (Signed)
Spoke to [place] and discussed moving appointment to next week.. Call complete.

## 2020-10-31 ENCOUNTER — Other Ambulatory Visit (HOSPITAL_COMMUNITY): Payer: Self-pay | Admitting: Adult Health

## 2020-11-01 NOTE — Progress Notes (Signed)
Remote ICD transmission.   

## 2020-11-04 DIAGNOSIS — R143 Flatulence: Secondary | ICD-10-CM | POA: Diagnosis not present

## 2020-11-05 ENCOUNTER — Other Ambulatory Visit (HOSPITAL_COMMUNITY): Payer: Self-pay

## 2020-11-05 NOTE — Progress Notes (Signed)
Paramedicine Encounter    Patient ID: Rhonda [name], female    DOB: 02-Feb-1951, 70 y.o.   MRN: [id number]   Patient Care Team: Maurice Small, MD as PCP - General (Family Medicine) [name], LCSW as Social Worker (Licensed Clinical Social Worker) Alda Berthold, DO as Consulting Physician (Neurology)  Patient Active Problem List   Diagnosis Date Noted   Abnormal gait 06/11/2020   Adult failure to thrive syndrome 06/11/2020   Allergic rhinitis 06/11/2020   Anxiety 06/11/2020   Asthma 06/11/2020   Benign intracranial hypertension 06/11/2020   Body mass index (BMI) 45.0-49.9, adult ([place]) 06/11/2020   Bowel incontinence 06/11/2020   Cholelithiasis 06/11/2020   Chronic sinusitis 06/11/2020   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   Daytime somnolence 06/11/2020   Diarrhea of presumed infectious origin 06/11/2020   Edema 06/11/2020   Family history of malignant neoplasm of gastrointestinal tract 06/11/2020   Gout 06/11/2020   History of fall 06/11/2020   History of infectious disease 06/11/2020   Insomnia 06/11/2020   Irregular bowel habits 06/11/2020   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   Lumbar spondylosis with myelopathy 06/11/2020   Macrocytosis 06/11/2020   Mild recurrent major depression ([place]) 06/11/2020   Personal history of colonic polyps 06/11/2020   Personal history of malignant neoplasm of breast 06/11/2020   Recurrent falls 06/11/2020   Mixed anxiety and depressive disorder 06/11/2020   Irritable bowel syndrome 01/04/2020   Spinal stenosis of lumbar region 01/03/2020   Other symptoms and signs involving the musculoskeletal system 09/11/2019   Bilateral leg weakness 08/01/2019   Leg swelling 08/01/2019   Diabetes mellitus with neuropathy ([place]) 06/15/2019   Hypokalemia 11/08/2018   CKD (chronic kidney disease) stage 3, GFR 30-59 ml/min (HCC) 11/08/2018   Orthostatic hypotension 07/28/2017   SVD (spontaneous vaginal delivery)    Peripheral neuropathy    On home oxygen therapy     Migraines    Left bundle branch block    Hypothyroidism    Hypertension    Hyperlipidemia    Heart murmur    GERD (gastroesophageal reflux disease)    Exertional shortness of breath    Major depressive disorder    Back pain    Arthritis    Generalized anxiety disorder    Anemia    Chronic respiratory failure ([place]) 09/14/2013   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   Morbid obesity ([place]) 40/98/1191   Chronic systolic heart failure ([place]) 10/27/2012   Endometrial polyp 01/20/2012   Malignant tumor of breast ([place]) 03/26/2011   Unspecified vitamin D deficiency 03/26/2011    Current Outpatient Medications:    acetaZOLAMIDE (DIAMOX) 125 MG tablet, TAKE 1 TABLET(125 MG) BY MOUTH DAILY, Disp: 90 tablet, Rfl: 3   amoxicillin-clavulanate (AUGMENTIN) 875-125 MG tablet, , Disp: , Rfl:    carvedilol (COREG) 3.125 MG tablet, TAKE 1 TABLET (3.125 MG) BY MOUTH TWICE DAILY WITH MEALS, Disp: 180 tablet, Rfl: 3   citalopram (CELEXA) 20 MG tablet, Take 20 mg by mouth daily., Disp: , Rfl:    clonazePAM (KLONOPIN) 0.5 MG tablet, , Disp: , Rfl:    dicyclomine (BENTYL) 20 MG tablet, 1 tablet, Disp: , Rfl:    DULoxetine (CYMBALTA) 30 MG capsule, , Disp: , Rfl:    DULoxetine (CYMBALTA) 60 MG capsule, , Disp: , Rfl:    fexofenadine (ALLEGRA) 180 MG tablet, Take 180 mg by mouth 2 (two) times daily. (Patient not taking: Reported on 10/22/2020), Disp: , Rfl:    fludrocortisone (FLORINEF) 0.1 MG tablet, 1  tablet, Disp: , Rfl:    fluticasone (FLONASE) 50 MCG/ACT nasal spray, , Disp: , Rfl:    gabapentin (NEURONTIN) 300 MG capsule, Take 1 capsule by mouth 3 (three) times daily., Disp: , Rfl:    gabapentin (NEURONTIN) 800 MG tablet, Take 800 mg by mouth at bedtime., Disp: , Rfl:    Ginkgo Biloba 120 MG CAPS, See admin instructions., Disp: , Rfl:    HYDROcodone-acetaminophen (NORCO) 10-325 MG tablet, 1 tablet as needed, Disp: , Rfl:    hydrOXYzine (ATARAX/VISTARIL) 25 MG  tablet, 1 tablet as needed (Patient not taking: No sig reported), Disp: , Rfl:    hydrOXYzine (VISTARIL) 25 MG capsule, 1 capsule as needed (Patient not taking: No sig reported), Disp: , Rfl:    ipratropium (ATROVENT) 0.03 % nasal spray, Place 2 sprays into both nostrils 2 (two) times daily., Disp: , Rfl:    levothyroxine (SYNTHROID) 88 MCG tablet, TAKE 1 TABLET(88 MCG) BY MOUTH DAILY BEFORE BREAKFAST, Disp: 90 tablet, Rfl: 3   lidocaine (XYLOCAINE) 2 % jelly, , Disp: , Rfl:    metolazone (ZAROXOLYN) 2.5 MG tablet, TAKE 1 TABLET BY MOUTH EVERY TUESDAY, Disp: 12 tablet, Rfl: 3   midodrine (PROAMATINE) 10 MG tablet, TAKE 1 TABLET (10 MG TOTAL) BY MOUTH 3 (THREE) TIMES DAILY WITH MEALS., Disp: 270 tablet, Rfl: 3   mirtazapine (REMERON) 30 MG tablet, 1 tablet at bedtime., Disp: , Rfl:    montelukast (SINGULAIR) 10 MG tablet, Take 10 mg by mouth at bedtime. , Disp: , Rfl: 1   omeprazole (PRILOSEC) 40 MG capsule, Take 40 mg by mouth daily., Disp: , Rfl:    potassium chloride SA (KLOR-CON) 20 MEQ tablet, TAKE 2 TABLETS THREE TIMES DAILY. TAKE AN EXTRA 2 TABLETS ON DAYS YOU TAKE METOLAZONE, Disp: 572 tablet, Rfl: 0   probenecid (BENEMID) 500 MG tablet, Take 500 mg by mouth 2 (two) times daily., Disp: , Rfl:    simvastatin (ZOCOR) 10 MG tablet, Take 10 mg by mouth every evening., Disp: , Rfl:    spironolactone (ALDACTONE) 25 MG tablet, Take 1 tablet (25 mg total) by mouth every evening., Disp: 90 tablet, Rfl: 3   torsemide (DEMADEX) 20 MG tablet, Take 3 tablets (60 mg total) by mouth 2 (two) times daily. Needs appt for further refills, Disp: 540 tablet, Rfl: 0   traMADol (ULTRAM) 50 MG tablet, 1 tablet as needed, Disp: , Rfl:  Allergies  Allergen Reactions   Ceftin Anaphylaxis    Face and throat swell    Geodon [Ziprasidone Hcl] Hives   Lisinopril Other (See Comments)    angioedema   Shellfish Allergy Other (See Comments)    Gout exacerbation   Shellfish-Derived Products     Other reaction(s): Other    Ultram [Tramadol] Itching   Allopurinol Nausea Only and Other (See Comments)    weakness   Ativan [Lorazepam] Itching   Sulfa Antibiotics Itching   Ultram [Tramadol Hcl] Itching   Valium [Diazepam] Other (See Comments)    Patient states that diazepam doesn't relax, it has the opposite effect.     Social History   Socioeconomic History   Marital status: Married    Spouse name: Not on file   Number of children: 2   Years of education: 18   Highest education level: Master's degree (e.g., MA, MS, MEng, MEd, MSW, MBA)  Occupational History   Occupation: retired  Tobacco Use   Smoking status: Some Days    Packs/day: 0.10    Years: 26.00  Pack years: 2.60    Types: Cigarettes    Last attempt to quit: 05/06/2007    Years since quitting: 13.5   Smokeless tobacco: Never  Vaping Use   Vaping Use: Never used  Substance and Sexual Activity   Alcohol use: No   Drug use: No   Sexual activity: Yes  Other Topics Concern   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  Constitutional:      Appearance: Normal appearance.  HENT:     Head: Normocephalic.     Nose: Nose normal.     Mouth/Throat:     Mouth: Mucous membranes are moist.     Pharynx: Oropharynx is clear.  Eyes:     Conjunctiva/sclera: Conjunctivae normal.     Pupils: Pupils are equal, round, and reactive to  light.  Cardiovascular:     Rate and Rhythm: Normal rate and regular rhythm.     Pulses: Normal pulses.     Heart sounds: Normal heart sounds.  Pulmonary:     Effort: Pulmonary effort is normal.     Breath sounds: Normal breath sounds.  Abdominal:     General: Abdomen is flat.     Palpations: Abdomen is soft.  Musculoskeletal:        General: Swelling present. Normal range of motion.     Cervical back: Normal range of motion.     Right lower leg: Edema present.     Left lower leg: Edema present.  Skin:    General: Skin is warm and dry.     Capillary Refill: Capillary refill takes less than 2 seconds.  Neurological:     General: No focal deficit present.     Mental Status: She is alert. Mental status is at baseline.  Psychiatric:        Mood and Affect: Mood normal.   Arrived for home visit for [facility] who was alert and oriented reporting to be feeling okay. She stated her husband has covid and is staying away from him as she is downstairs and he is upstairs and using PPE precautions. Thalia reports feeling fine. Ailene says she has not been eating very good because they have had family in town and she was eating salty, fried foods. I educated [place] on the importance of staying away from those types of foods. She verbalized understanding.Mrs. [place] was compliant with medications over the last few weeks. I reviewed medications and filled two weeks worth of pill boxes for Mrs. Farha. I also gave her, her morning medications. Vitals as assessment as noted. Jaziah denied chest pain, shortness of breath, dizziness or trouble walking around her home. Berlie had some swelling but no more than her normal. I reminded [place] of her upcoming appointments. [name] agreed with same.  I planned home visit for two weeks from today. Home visit complete.   Refills: NONE      Future Appointments  Date Time Provider [place]  12/02/2020  7:20 AM [facility] DEVICE REMOT[facility]  01/08/2021  3:45 PM Marzetta Board, DPM [facility]  01/09/2021  7:40 AM [facility] DEVICE REMOTES [facility]  02/27/2021  9:50 AM Narda Amber K, DO [facility] None  04/10/2021  7:40 AM [facility]ICE REMOT[facility]  07/10/2021  7:40 AM [facility]ICE [facility]OT[facility]  08/11/2021  7:40 AM [facility]ICE [facility]  09/10/2021  7:40 AM [facility] DEVICE REMOTES [facility]     ACTION: Home visit completed

## 2020-11-06 ENCOUNTER — Telehealth (HOSPITAL_COMMUNITY): Payer: Self-pay

## 2020-11-06 DIAGNOSIS — U071 COVID-19: Secondary | ICD-10-CM | POA: Diagnosis not present

## 2020-11-06 NOTE — Telephone Encounter (Signed)
Spoke to [name], Rhonda [name] reports she tested positive for COVID yesterday and reports her symptoms are cough, sore throat with some shortness of breath while walking. She has called her PCP and they are following up. I will see Rhonda [name] in two weeks. Rhonda [name] understands to call me if she needs anything. Call complete.

## 2020-11-13 ENCOUNTER — Telehealth (HOSPITAL_COMMUNITY): Payer: Self-pay

## 2020-11-13 NOTE — Telephone Encounter (Signed)
Spoke to Rhonda [name], Rhonda [name]'s husband who reports Rhonda [name] was still covid positive with a congestive cough, but over well doing well. She had a virtual visit with PCP today. I reminded Rhonda [name] to let Rhonda [name] know I will be out for home visit next week.

## 2020-11-19 ENCOUNTER — Other Ambulatory Visit (HOSPITAL_COMMUNITY): Payer: Self-pay

## 2020-11-19 NOTE — Progress Notes (Signed)
Paramedicine Encounter    Patient ID: Rhonda [name], female    DOB: [birth year], 70 y.o.   MRN: [id number]   Patient Care Team: Maurice Small, MD as PCP - General (Family Medicine) [name], LCSW as Social Worker (Licensed Clinical Social Worker) Alda Berthold, DO as Consulting Physician (Neurology)  Patient Active Problem List   Diagnosis Date Noted   Abnormal gait 06/11/2020   Adult failure to thrive syndrome 06/11/2020   Allergic rhinitis 06/11/2020   Anxiety 06/11/2020   Asthma 06/11/2020   Benign intracranial hypertension 06/11/2020   Body mass index (BMI) 45.0-49.9, adult ([place]) 06/11/2020   Bowel incontinence 06/11/2020   Cholelithiasis 06/11/2020   Chronic sinusitis 06/11/2020   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   Daytime somnolence 06/11/2020   Diarrhea of presumed infectious origin 06/11/2020   Edema 06/11/2020   Family history of malignant neoplasm of gastrointestinal tract 06/11/2020   Gout 06/11/2020   History of fall 06/11/2020   History of infectious disease 06/11/2020   Insomnia 06/11/2020   Irregular bowel habits 06/11/2020   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   Lumbar spondylosis with myelopathy 06/11/2020   Macrocytosis 06/11/2020   Mild recurrent major depression ([place]) 06/11/2020   Personal history of colonic polyps 06/11/2020   Personal history of malignant neoplasm of breast 06/11/2020   Recurrent falls 06/11/2020   Mixed anxiety and depressive disorder 06/11/2020   Irritable bowel syndrome 01/04/2020   Spinal stenosis of lumbar region 01/03/2020   Other symptoms and signs involving the musculoskeletal system 09/11/2019   Bilateral leg weakness 08/01/2019   Leg swelling 08/01/2019   Diabetes mellitus with neuropathy ([place]) 06/15/2019   Hypokalemia 11/08/2018   CKD (chronic kidney disease) stage 3, GFR 30-59 ml/min (HCC) 11/08/2018   Orthostatic hypotension 07/28/2017   SVD (spontaneous vaginal delivery)    Peripheral neuropathy    On home oxygen therapy     Migraines    Left bundle branch block    Hypothyroidism    Hypertension    Hyperlipidemia    Heart murmur    GERD (gastroesophageal reflux disease)    Exertional shortness of breath    Major depressive disorder    Back pain    Arthritis    Generalized anxiety disorder    Anemia    Chronic respiratory failure ([place]) 09/14/2013   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   Morbid obesity ([place]) 62/22/9798   Chronic systolic heart failure ([place]) 10/27/2012   Endometrial polyp 01/20/2012   Malignant tumor of breast ([place]) 03/26/2011   Unspecified vitamin D deficiency 03/26/2011    Current Outpatient Medications:    acetaZOLAMIDE (DIAMOX) 125 MG tablet, TAKE 1 TABLET(125 MG) BY MOUTH DAILY, Disp: 90 tablet, Rfl: 3   amoxicillin-clavulanate (AUGMENTIN) 875-125 MG tablet, , Disp: , Rfl:    carvedilol (COREG) 3.125 MG tablet, TAKE 1 TABLET (3.125 MG) BY MOUTH TWICE DAILY WITH MEALS, Disp: 180 tablet, Rfl: 3   citalopram (CELEXA) 20 MG tablet, Take 20 mg by mouth daily., Disp: , Rfl:    clonazePAM (KLONOPIN) 0.5 MG tablet, , Disp: , Rfl:    dicyclomine (BENTYL) 20 MG tablet, 1 tablet, Disp: , Rfl:    DULoxetine (CYMBALTA) 30 MG capsule, , Disp: , Rfl:    DULoxetine (CYMBALTA) 60 MG capsule, , Disp: , Rfl:    fexofenadine (ALLEGRA) 180 MG tablet, Take 180 mg by mouth 2 (two) times daily. (Patient not taking: No sig reported), Disp: , Rfl:    fludrocortisone (FLORINEF) 0.1 MG tablet, 1  tablet, Disp: , Rfl:    fluticasone (FLONASE) 50 MCG/ACT nasal spray, , Disp: , Rfl:    gabapentin (NEURONTIN) 300 MG capsule, Take 1 capsule by mouth 3 (three) times daily., Disp: , Rfl:    gabapentin (NEURONTIN) 800 MG tablet, Take 800 mg by mouth at bedtime., Disp: , Rfl:    Ginkgo Biloba 120 MG CAPS, See admin instructions., Disp: , Rfl:    HYDROcodone-acetaminophen (NORCO) 10-325 MG tablet, 1 tablet as needed, Disp: , Rfl:    hydrOXYzine (ATARAX/VISTARIL) 25 MG tablet, 1  tablet as needed (Patient not taking: No sig reported), Disp: , Rfl:    hydrOXYzine (VISTARIL) 25 MG capsule, 1 capsule as needed (Patient not taking: No sig reported), Disp: , Rfl:    ipratropium (ATROVENT) 0.03 % nasal spray, Place 2 sprays into both nostrils 2 (two) times daily., Disp: , Rfl:    levothyroxine (SYNTHROID) 88 MCG tablet, TAKE 1 TABLET(88 MCG) BY MOUTH DAILY BEFORE BREAKFAST, Disp: 90 tablet, Rfl: 3   lidocaine (XYLOCAINE) 2 % jelly, , Disp: , Rfl:    metolazone (ZAROXOLYN) 2.5 MG tablet, TAKE 1 TABLET BY MOUTH EVERY TUESDAY, Disp: 12 tablet, Rfl: 3   midodrine (PROAMATINE) 10 MG tablet, TAKE 1 TABLET (10 MG TOTAL) BY MOUTH 3 (THREE) TIMES DAILY WITH MEALS., Disp: 270 tablet, Rfl: 3   mirtazapine (REMERON) 30 MG tablet, 1 tablet at bedtime., Disp: , Rfl:    montelukast (SINGULAIR) 10 MG tablet, Take 10 mg by mouth at bedtime. , Disp: , Rfl: 1   omeprazole (PRILOSEC) 40 MG capsule, Take 40 mg by mouth daily., Disp: , Rfl:    potassium chloride SA (KLOR-CON) 20 MEQ tablet, TAKE 2 TABLETS THREE TIMES DAILY. TAKE AN EXTRA 2 TABLETS ON DAYS YOU TAKE METOLAZONE, Disp: 572 tablet, Rfl: 0   probenecid (BENEMID) 500 MG tablet, Take 500 mg by mouth 2 (two) times daily., Disp: , Rfl:    simvastatin (ZOCOR) 10 MG tablet, Take 10 mg by mouth every evening., Disp: , Rfl:    spironolactone (ALDACTONE) 25 MG tablet, Take 1 tablet (25 mg total) by mouth every evening., Disp: 90 tablet, Rfl: 3   torsemide (DEMADEX) 20 MG tablet, Take 3 tablets (60 mg total) by mouth 2 (two) times daily. Needs appt for further refills, Disp: 540 tablet, Rfl: 0   traMADol (ULTRAM) 50 MG tablet, 1 tablet as needed (Patient not taking: Reported on 11/05/2020), Disp: , Rfl:  Allergies  Allergen Reactions   Ceftin Anaphylaxis    Face and throat swell    Geodon [Ziprasidone Hcl] Hives   Lisinopril Other (See Comments)    angioedema   Shellfish Allergy Other (See Comments)    Gout exacerbation   Shellfish-Derived  Products     Other reaction(s): Other   Ultram [Tramadol] Itching   Allopurinol Nausea Only and Other (See Comments)    weakness   Ativan [Lorazepam] Itching   Sulfa Antibiotics Itching   Ultram [Tramadol Hcl] Itching   Valium [Diazepam] Other (See Comments)    Patient states that diazepam doesn't relax, it has the opposite effect.     Social History   Socioeconomic History   Marital status: Married    Spouse name: Not on file   Number of children: 2   Years of education: 18   Highest education level: Master's degree (e.g., MA, MS, MEng, MEd, MSW, MBA)  Occupational History   Occupation: retired  Tobacco Use   Smoking status: Some Days    Packs/day: 0.10  Years: 26.00    Pack years: 2.60    Types: Cigarettes    Last attempt to quit: 05/06/2007    Years since quitting: 13.5   Smokeless tobacco: Never  Vaping Use   Vaping Use: Never used  Substance and Sexual Activity   Alcohol use: No   Drug use: No   Sexual activity: Yes  Other Topics Concern   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  Constitutional:      Appearance: Normal appearance. She is normal weight.  HENT:     Head: Normocephalic.     Nose: Nose normal.     Mouth/Throat:     Mouth: Mucous membranes are moist.     Pharynx: Oropharynx is clear.  Eyes:     Conjunctiva/sclera: Conjunctivae  normal.     Pupils: Pupils are equal, round, and reactive to light.  Cardiovascular:     Rate and Rhythm: Normal rate and regular rhythm.     Pulses: Normal pulses.     Heart sounds: Normal heart sounds.  Pulmonary:     Effort: Pulmonary effort is normal.     Breath sounds: Normal breath sounds.  Abdominal:     General: Abdomen is flat.     Palpations: Abdomen is soft.  Musculoskeletal:        General: Swelling present. Normal range of motion.     Cervical back: Normal range of motion.     Right lower leg: Edema present.     Left lower leg: Edema present.  Skin:    General: Skin is warm and dry.     Capillary Refill: Capillary refill takes less than 2 seconds.  Neurological:     General: No focal deficit present.     Mental Status: She is alert. Mental status is at baseline.  Psychiatric:        Mood and Affect: Mood normal.        Thought Content: Thought content normal.    Arrived for home visit for Rhonda [name] who was alert and oriented reporting to be feeling okay today but having some residual leg pain and aches with sinus congestion from having Covid. Rhonda [name] reports she has been following up with her PCP and they prescribed her a Z-Pack to take and she is almost done with same. Rhonda [name] says she is feeling better than last week. I obtained vitals for Rhonda [name] and lung sounds clear on assessment. Some swelling in her lower legs with edema which is normal for Rhonda [name]. Her weight is up 6lbs from our last visit two weeks ago. I encouraged her to limit her fluid intake as she drinks a lot of fluids throughout the day. She agreed with plan. Rhonda [name] denied increased shortness of breath, dizziness  or chest pain. I reviewed Rhonda [name] medications and filled one pill box accordingly. Rhonda [name] and I reviewed appointments with nothing upcoming. Rhonda [name] agreed to home visit in one week. Home visit complete.   Refills: Gabapentin     Future Appointments  Date Time Provider [place]  12/02/2020  7:20 AM [facility] DEVICE [facility]OTES [facility]  01/08/2021  3:45 PM Marzetta Board, DPM [facility]  01/09/2021  7:40 AM [facility] DEVICE REMOTES [facility]  02/27/2021  9:50 AM Narda Amber K, DO [facility] None  04/10/2021  7:40 AM [facility]ICE [facility]OT[facility]  07/10/2021  7:40 AM [facility]  08/11/2021  7:40 AM [facility]  09/10/2021  7:40 AM [facility]ICE [facility]     ACTION: Home visit completed

## 2020-11-26 ENCOUNTER — Other Ambulatory Visit (HOSPITAL_COMMUNITY): Payer: Self-pay

## 2020-11-26 NOTE — Progress Notes (Signed)
Paramedicine Encounter    Patient ID: Rhonda [name], female    DOB: [birth year], 71 y.o.   MRN: [id number]  Today I met with Rhonda [name] who reports to be feeling well with no complaints today. She has tested negative for covid in the last 7 days. I reviewed medications and filled two pill boxes properly. Rhonda [name] was reminded of upcoming appointments and we discussed reminders of limiting salts and fluid intake. She verbalized agreement. Med rec complete. I will return in two weeks for full visit.   Refills: NONE    ACTION: Home visit completed

## 2020-11-30 ENCOUNTER — Other Ambulatory Visit (HOSPITAL_COMMUNITY): Payer: Self-pay | Admitting: Adult Health

## 2020-12-02 ENCOUNTER — Ambulatory Visit (INDEPENDENT_AMBULATORY_CARE_PROVIDER_SITE_OTHER): Payer: Medicare HMO

## 2020-12-02 DIAGNOSIS — I5022 Chronic systolic (congestive) heart failure: Secondary | ICD-10-CM | POA: Diagnosis not present

## 2020-12-02 DIAGNOSIS — Z95810 Presence of automatic (implantable) cardiac defibrillator: Secondary | ICD-10-CM

## 2020-12-02 NOTE — Progress Notes (Signed)
EPIC Encounter for ICM Monitoring  Patient Name: Rhonda [name] is a 70 y.o. female Date: 12/02/2020 Primary Care Physican: Maurice Small, MD Primary Cardiologist: [place] Electrophysiologist: Santina Evans Pacing: 98.6%   10/22/2020  Weight: 209 lbs      Spoke with patient and heart failure questions reviewed.  Pt asymptomatic for fluid accumulation and feeing well.  She admits for the last few weeks drinking >64 oz fluid daily because she is so thirsty.   Optivol thoracic impedance suggesting possible fluid accumulation starting 10/30/2020 but trending back toward baseline.    Prescribed:  Patient participating in paramedicine program to assist with meds. Torsemide 20 mg 3 tablets (60 mg total) by mouth twice a day.   Potassium  20 meq 2 tablets (40 mEq total) by mouth 3 (three) times daily.  Take extra 2 tablets on days you take Metolazone. Metolazone 2.5mg  1 tablet every Tuesday. Spironolactone 25 mg take 1 tablet by mouth every evening.   Labs: 04/09/2020 Creatinine 1.44, BUN 40, Potassium 4.9, Sodium 142 03/27/2020 Creatinine 1.71, BUN 33, Potassium 3.8, Sodium 141, GFR 32 03/14/2020 Creatinine 1.67, BUN 30, Potassium 3.4, Sodium 139, GFR 33  A complete set of results can be found in Results Review.   Recommendations:  Recommendation to limit salt intake to 2000 mg daily and fluid intake to 64 oz daily.  Encouraged to call if experiencing any fluid symptoms. Advised to call Dr Bensimhon's office to schedule overdue office visit.   Follow-up plan: ICM clinic phone appointment on 12/11/2020 (Manual) to recheck fluid levels.   91 day device clinic remote transmission 01/09/2021.     EP/Cardiology Office Visits:  Recall 09/10/2020 with Dr Haroldine Laws.  Recall 07/26/2021 with Dr Lovena Le.     Copy of ICM check sent to Dr. Lovena Le.        3 month ICM trend: 12/02/2020.    1 Year ICM trend:       [name], RN 12/02/2020 2:06 PM

## 2020-12-10 ENCOUNTER — Other Ambulatory Visit (HOSPITAL_COMMUNITY): Payer: Self-pay

## 2020-12-10 NOTE — Progress Notes (Signed)
Paramedicine Encounter    Patient ID: Rhonda [name], female    DOB: 1950/11/04, 70 y.o.   MRN: [id number]   Patient Care Team: Maurice Small, MD as PCP - General (Family Medicine) [name], LCSW as Social Worker (Licensed Clinical Social Worker) Alda Berthold, DO as Consulting Physician (Neurology)  Patient Active Problem List   Diagnosis Date Noted   Abnormal gait 06/11/2020   Adult failure to thrive syndrome 06/11/2020   Allergic rhinitis 06/11/2020   Anxiety 06/11/2020   Asthma 06/11/2020   Benign intracranial hypertension 06/11/2020   Body mass index (BMI) 45.0-49.9, adult ([place]) 06/11/2020   Bowel incontinence 06/11/2020   Cholelithiasis 06/11/2020   Chronic sinusitis 06/11/2020   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   Daytime somnolence 06/11/2020   Diarrhea of presumed infectious origin 06/11/2020   Edema 06/11/2020   Family history of malignant neoplasm of gastrointestinal tract 06/11/2020   Gout 06/11/2020   History of fall 06/11/2020   History of infectious disease 06/11/2020   Insomnia 06/11/2020   Irregular bowel habits 06/11/2020   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   Lumbar spondylosis with myelopathy 06/11/2020   Macrocytosis 06/11/2020   Mild recurrent major depression ([place]) 06/11/2020   Personal history of colonic polyps 06/11/2020   Personal history of malignant neoplasm of breast 06/11/2020   Recurrent falls 06/11/2020   Mixed anxiety and depressive disorder 06/11/2020   Irritable bowel syndrome 01/04/2020   Spinal stenosis of lumbar region 01/03/2020   Other symptoms and signs involving the musculoskeletal system 09/11/2019   Bilateral leg weakness 08/01/2019   Leg swelling 08/01/2019   Diabetes mellitus with neuropathy ([place]) 06/15/2019   Hypokalemia 11/08/2018   CKD (chronic kidney disease) stage 3, GFR 30-59 ml/min (HCC) 11/08/2018   Orthostatic hypotension 07/28/2017   SVD (spontaneous vaginal delivery)    Peripheral neuropathy    On home oxygen therapy     Migraines    Left bundle branch block    Hypothyroidism    Hypertension    Hyperlipidemia    Heart murmur    GERD (gastroesophageal reflux disease)    Exertional shortness of breath    Major depressive disorder    Back pain    Arthritis    Generalized anxiety disorder    Anemia    Chronic respiratory failure ([place]) 09/14/2013   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   Morbid obesity ([place]) 27/07/5007   Chronic systolic heart failure ([place]) 10/27/2012   Endometrial polyp 01/20/2012   Malignant tumor of breast ([place]) 03/26/2011   Unspecified vitamin D deficiency 03/26/2011    Current Outpatient Medications:    acetaZOLAMIDE (DIAMOX) 125 MG tablet, TAKE 1 TABLET(125 MG) BY MOUTH DAILY, Disp: 90 tablet, Rfl: 3   amoxicillin-clavulanate (AUGMENTIN) 875-125 MG tablet, , Disp: , Rfl:    carvedilol (COREG) 3.125 MG tablet, TAKE 1 TABLET (3.125 MG) BY MOUTH TWICE DAILY WITH MEALS, Disp: 180 tablet, Rfl: 3   citalopram (CELEXA) 20 MG tablet, Take 20 mg by mouth daily., Disp: , Rfl:    clonazePAM (KLONOPIN) 0.5 MG tablet, , Disp: , Rfl:    dicyclomine (BENTYL) 20 MG tablet, 1 tablet, Disp: , Rfl:    DULoxetine (CYMBALTA) 30 MG capsule, , Disp: , Rfl:    DULoxetine (CYMBALTA) 60 MG capsule, , Disp: , Rfl:    fexofenadine (ALLEGRA) 180 MG tablet, Take 180 mg by mouth 2 (two) times daily. (Patient not taking: No sig reported), Disp: , Rfl:    fludrocortisone (FLORINEF) 0.1 MG tablet, 1  tablet, Disp: , Rfl:    fluticasone (FLONASE) 50 MCG/ACT nasal spray, , Disp: , Rfl:    gabapentin (NEURONTIN) 300 MG capsule, Take 1 capsule by mouth 3 (three) times daily., Disp: , Rfl:    gabapentin (NEURONTIN) 800 MG tablet, Take 800 mg by mouth at bedtime., Disp: , Rfl:    Ginkgo Biloba 120 MG CAPS, See admin instructions. (Patient not taking: Reported on 11/19/2020), Disp: , Rfl:    HYDROcodone-acetaminophen (NORCO) 10-325 MG tablet, 1 tablet as needed, Disp: , Rfl:     hydrOXYzine (ATARAX/VISTARIL) 25 MG tablet, 1 tablet as needed (Patient not taking: No sig reported), Disp: , Rfl:    hydrOXYzine (VISTARIL) 25 MG capsule, 1 capsule as needed (Patient not taking: No sig reported), Disp: , Rfl:    ipratropium (ATROVENT) 0.03 % nasal spray, Place 2 sprays into both nostrils 2 (two) times daily., Disp: , Rfl:    levothyroxine (SYNTHROID) 88 MCG tablet, TAKE 1 TABLET(88 MCG) BY MOUTH DAILY BEFORE BREAKFAST, Disp: 90 tablet, Rfl: 3   lidocaine (XYLOCAINE) 2 % jelly, , Disp: , Rfl:    metolazone (ZAROXOLYN) 2.5 MG tablet, TAKE 1 TABLET BY MOUTH EVERY TUESDAY, Disp: 12 tablet, Rfl: 3   midodrine (PROAMATINE) 10 MG tablet, TAKE 1 TABLET (10 MG TOTAL) BY MOUTH 3 (THREE) TIMES DAILY WITH MEALS., Disp: 270 tablet, Rfl: 3   mirtazapine (REMERON) 30 MG tablet, 1 tablet at bedtime., Disp: , Rfl:    montelukast (SINGULAIR) 10 MG tablet, Take 10 mg by mouth at bedtime. , Disp: , Rfl: 1   omeprazole (PRILOSEC) 40 MG capsule, Take 40 mg by mouth daily., Disp: , Rfl:    potassium chloride SA (KLOR-CON) 20 MEQ tablet, TAKE 2 TABLETS THREE TIMES DAILY. TAKE AN EXTRA 2 TABLETS ON DAYS YOU TAKE METOLAZONE, Disp: 572 tablet, Rfl: 0   probenecid (BENEMID) 500 MG tablet, Take 500 mg by mouth 2 (two) times daily., Disp: , Rfl:    simvastatin (ZOCOR) 10 MG tablet, Take 10 mg by mouth every evening., Disp: , Rfl:    spironolactone (ALDACTONE) 25 MG tablet, Take 1 tablet (25 mg total) by mouth every evening. Needs appt for further refills, Disp: 90 tablet, Rfl: 0   torsemide (DEMADEX) 20 MG tablet, Take 3 tablets (60 mg total) by mouth 2 (two) times daily. Needs appt for further refills, Disp: 540 tablet, Rfl: 0   traMADol (ULTRAM) 50 MG tablet, 1 tablet as needed (Patient not taking: Reported on 11/05/2020), Disp: , Rfl:  Allergies  Allergen Reactions   Ceftin Anaphylaxis    Face and throat swell    Geodon [Ziprasidone Hcl] Hives   Lisinopril Other (See Comments)    angioedema   Shellfish  Allergy Other (See Comments)    Gout exacerbation   Shellfish-Derived Products     Other reaction(s): Other   Ultram [Tramadol] Itching   Allopurinol Nausea Only and Other (See Comments)    weakness   Ativan [Lorazepam] Itching   Sulfa Antibiotics Itching   Ultram [Tramadol Hcl] Itching   Valium [Diazepam] Other (See Comments)    Patient states that diazepam doesn't relax, it has the opposite effect.     Social History   Socioeconomic History   Marital status: Married    Spouse name: Not on file   Number of children: 2   Years of education: 18   Highest education level: Master's degree (e.g., MA, MS, MEng, MEd, MSW, MBA)  Occupational History   Occupation: retired  Tobacco Use  Smoking status: Some Days    Packs/day: 0.10    Years: 26.00    Pack years: 2.60    Types: Cigarettes    Last attempt to quit: 05/06/2007    Years since quitting: 13.6   Smokeless tobacco: Never  Vaping Use   Vaping Use: Never used  Substance and Sexual Activity   Alcohol use: No   Drug use: No   Sexual activity: Yes  Other Topics Concern   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  Constitutional:      Appearance: Normal appearance. She is normal weight.  HENT:     Head: Normocephalic.     Nose: Nose normal.     Mouth/Throat:     Mouth: Mucous membranes are moist.     Pharynx:  Oropharynx is clear.  Eyes:     Conjunctiva/sclera: Conjunctivae normal.     Pupils: Pupils are equal, round, and reactive to light.  Cardiovascular:     Rate and Rhythm: Normal rate and regular rhythm.     Pulses: Normal pulses.     Heart sounds: Normal heart sounds.  Pulmonary:     Effort: Pulmonary effort is normal.     Breath sounds: Normal breath sounds.  Abdominal:     General: Abdomen is flat.     Palpations: Abdomen is soft.  Musculoskeletal:        General: Swelling present. Normal range of motion.     Cervical back: Normal range of motion.     Right lower leg: Edema present.     Left lower leg: Edema present.     Comments: Bilateral leg pain.   Skin:    General: Skin is warm and dry.     Capillary Refill: Capillary refill takes less than 2 seconds.  Neurological:     General: No focal deficit present.     Mental Status: She is alert. Mental status is at baseline.  Psychiatric:        Mood and Affect: Mood normal.   Arrived for home visit for Rhonda [name] who was alert and oriented reporting to be feeling okay stating her legs were hurting. She denied any recent injury but this pain is chronic for Rhonda [name].   Vitals and assessment as noted in report. Some swelling in her lower legs but this is also chronic for her.   Meds reviewed and confirmed. Two pill boxes filled for Rhonda [name].   Appointments reviewed and confirmed.   Rhonda [name] agreed on home visit in two weeks.   We discussed diet and exercise importance and she report she plans to try going to the [employer] with her daughter soon. I encouraged same.   I will see Rhonda [name] in two weeks.   Refills: Bentyl (  [facility])     Future Appointments  Date Time Provider [place]  12/11/2020  7:00 AM [facility]  01/08/2021  3:45 PM Marzetta Board, DPM [facility]  01/09/2021  7:40 AM [facility]ICE REMOT[facility]  02/27/2021  9:50 AM Narda Amber K, DO [facility] None  04/10/2021  7:40 AM [facility]  07/10/2021  7:40 AM [facility]  08/11/2021  7:40 AM [facility]  09/10/2021  7:40 AM [facility]     ACTION: Home visit completed

## 2020-12-11 ENCOUNTER — Ambulatory Visit (INDEPENDENT_AMBULATORY_CARE_PROVIDER_SITE_OTHER): Payer: Medicare HMO

## 2020-12-11 DIAGNOSIS — M48061 Spinal stenosis, lumbar region without neurogenic claudication: Secondary | ICD-10-CM | POA: Diagnosis not present

## 2020-12-11 DIAGNOSIS — I5022 Chronic systolic (congestive) heart failure: Secondary | ICD-10-CM

## 2020-12-11 DIAGNOSIS — K580 Irritable bowel syndrome with diarrhea: Secondary | ICD-10-CM | POA: Diagnosis not present

## 2020-12-11 DIAGNOSIS — Z95810 Presence of automatic (implantable) cardiac defibrillator: Secondary | ICD-10-CM

## 2020-12-11 DIAGNOSIS — L659 Nonscarring hair loss, unspecified: Secondary | ICD-10-CM | POA: Diagnosis not present

## 2020-12-12 ENCOUNTER — Telehealth: Payer: Self-pay

## 2020-12-12 NOTE — Telephone Encounter (Signed)
Patient agreed to send missed ICM transmission.   She asked can the nurse send her a letter stating if she can have an X-ray and a MRI? She wants it for her own personal records.

## 2020-12-13 NOTE — Telephone Encounter (Signed)
Attempted to return pt phone call.  No answer.  Left detailed message for pt on VM (DPR ndicating ok to).    Advised that there is no contraindication for Xrays.  As far as MRI, this needs to be assessed prior to testing to ensure device is electrically intact.  Therefore I cannot provide a blanket letter.  At current time device is compatible.

## 2020-12-16 ENCOUNTER — Other Ambulatory Visit: Payer: Self-pay | Admitting: Neurology

## 2020-12-16 NOTE — Progress Notes (Signed)
EPIC Encounter for ICM Monitoring  Patient Name: Rhonda [name] is a 70 y.o. female Date: 12/16/2020 Primary Care Physican: Maurice Small, MD Primary Cardiologist: [place] Electrophysiologist: Santina Evans Pacing: 98.6%   10/22/2020  Weight: 209 lbs      Transmission reviewed.    Optivol thoracic impedance suggesting fluid levels returned to baseline after adjusting fluid intake.    Prescribed:  Patient participating in paramedicine program to assist with meds. Torsemide 20 mg 3 tablets (60 mg total) by mouth twice a day.   Potassium  20 meq 2 tablets (40 mEq total) by mouth 3 (three) times daily.  Take extra 2 tablets on days you take Metolazone. Metolazone 2.'5mg'$  1 tablet every Tuesday. Spironolactone 25 mg take 1 tablet by mouth every evening.   Labs: 04/09/2020 Creatinine 1.44, BUN 40, Potassium 4.9, Sodium 142 03/27/2020 Creatinine 1.71, BUN 33, Potassium 3.8, Sodium 141, GFR 32 03/14/2020 Creatinine 1.67, BUN 30, Potassium 3.4, Sodium 139, GFR 33  A complete set of results can be found in Results Review.   Recommendations:  No changes   Follow-up plan: ICM clinic phone appointment on 01/20/2021.   91 day device clinic remote transmission 01/09/2021.     EP/Cardiology Office Visits:  Recall 09/10/2020 with Dr Haroldine Laws.  Recall 07/26/2021 with Dr Lovena Le.     Copy of ICM check sent to Dr. Lovena Le.      3 month ICM trend: 12/12/2020.    1 Year ICM trend:       [name], RN 12/16/2020 2:12 PM

## 2020-12-24 ENCOUNTER — Other Ambulatory Visit (HOSPITAL_COMMUNITY): Payer: Self-pay

## 2020-12-24 NOTE — Progress Notes (Signed)
Paramedicine Encounter    Patient ID: Rhonda [name], female    DOB: 02-01-1951, 70 y.o.   MRN: [id number]   Rhonda [name] to visit Rhonda [name] for a home visit, she was in bed and reporting to be very sleepy after not sleeping well the night before. I reviewed her medications and pill box was filled accordingly. I will come back for full home visit in one week. Rhonda [name] agreed with plan and was grateful to be able to continue resting.    ACTION: Home visit completed

## 2020-12-31 ENCOUNTER — Other Ambulatory Visit (HOSPITAL_COMMUNITY): Payer: Self-pay

## 2020-12-31 NOTE — Progress Notes (Signed)
Paramedicine Encounter    Patient ID: Rhonda [name], female    DOB: Mar 24, 1951, 70 y.o.   MRN: [id number]   Patient Care Team: Maurice Small, MD as PCP - General (Family Medicine) [name], LCSW as Social Worker (Licensed Clinical Social Worker) Alda Berthold, DO as Consulting Physician (Neurology)  Patient Active Problem List   Diagnosis Date Noted   Abnormal gait 06/11/2020   Adult failure to thrive syndrome 06/11/2020   Allergic rhinitis 06/11/2020   Anxiety 06/11/2020   Asthma 06/11/2020   Benign intracranial hypertension 06/11/2020   Body mass index (BMI) 45.0-49.9, adult ([place]) 06/11/2020   Bowel incontinence 06/11/2020   Cholelithiasis 06/11/2020   Chronic sinusitis 06/11/2020   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   Daytime somnolence 06/11/2020   Diarrhea of presumed infectious origin 06/11/2020   Edema 06/11/2020   Family history of malignant neoplasm of gastrointestinal tract 06/11/2020   Gout 06/11/2020   History of fall 06/11/2020   History of infectious disease 06/11/2020   Insomnia 06/11/2020   Irregular bowel habits 06/11/2020   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   Lumbar spondylosis with myelopathy 06/11/2020   Macrocytosis 06/11/2020   Mild recurrent major depression ([place]) 06/11/2020   Personal history of colonic polyps 06/11/2020   Personal history of malignant neoplasm of breast 06/11/2020   Recurrent falls 06/11/2020   Mixed anxiety and depressive disorder 06/11/2020   Irritable bowel syndrome 01/04/2020   Spinal stenosis of lumbar region 01/03/2020   Other symptoms and signs involving the musculoskeletal system 09/11/2019   Bilateral leg weakness 08/01/2019   Leg swelling 08/01/2019   Diabetes mellitus with neuropathy ([place]) 06/15/2019   Hypokalemia 11/08/2018   CKD (chronic kidney disease) stage 3, GFR 30-59 ml/min (HCC) 11/08/2018   Orthostatic hypotension 07/28/2017   SVD (spontaneous vaginal delivery)    Peripheral neuropathy    On home oxygen therapy     Migraines    Left bundle branch block    Hypothyroidism    Hypertension    Hyperlipidemia    Heart murmur    GERD (gastroesophageal reflux disease)    Exertional shortness of breath    Major depressive disorder    Back pain    Arthritis    Generalized anxiety disorder    Anemia    Chronic respiratory failure ([place]) 09/14/2013   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   Morbid obesity ([place]) 0000000   Chronic systolic heart failure ([place]) 10/27/2012   Endometrial polyp 01/20/2012   Malignant tumor of breast ([place]) 03/26/2011   Unspecified vitamin D deficiency 03/26/2011    Current Outpatient Medications:    acetaZOLAMIDE (DIAMOX) 125 MG tablet, TAKE 1 TABLET(125 MG) BY MOUTH DAILY, Disp: 90 tablet, Rfl: 3   amoxicillin-clavulanate (AUGMENTIN) 875-125 MG tablet, , Disp: , Rfl:    carvedilol (COREG) 3.125 MG tablet, TAKE 1 TABLET (3.125 MG) BY MOUTH TWICE DAILY WITH MEALS, Disp: 180 tablet, Rfl: 3   citalopram (CELEXA) 20 MG tablet, Take 20 mg by mouth daily., Disp: , Rfl:    clonazePAM (KLONOPIN) 0.5 MG tablet, , Disp: , Rfl:    dicyclomine (BENTYL) 20 MG tablet, 1 tablet, Disp: , Rfl:    DULoxetine (CYMBALTA) 30 MG capsule, , Disp: , Rfl:    DULoxetine (CYMBALTA) 60 MG capsule, , Disp: , Rfl:    fexofenadine (ALLEGRA) 180 MG tablet, Take 180 mg by mouth 2 (two) times daily. (Patient not taking: No sig reported), Disp: , Rfl:    fludrocortisone (FLORINEF) 0.1 MG tablet, TAKE  1 TABLET TWICE DAILY, Disp: 180 tablet, Rfl: 0   fluticasone (FLONASE) 50 MCG/ACT nasal spray, , Disp: , Rfl:    gabapentin (NEURONTIN) 300 MG capsule, Take 1 capsule by mouth 3 (three) times daily., Disp: , Rfl:    gabapentin (NEURONTIN) 800 MG tablet, Take 800 mg by mouth at bedtime., Disp: , Rfl:    Ginkgo Biloba 120 MG CAPS, See admin instructions., Disp: , Rfl:    HYDROcodone-acetaminophen (NORCO) 10-325 MG tablet, 1 tablet as needed, Disp: , Rfl:    hydrOXYzine  (ATARAX/VISTARIL) 25 MG tablet, 1 tablet as needed (Patient not taking: No sig reported), Disp: , Rfl:    hydrOXYzine (VISTARIL) 25 MG capsule, 1 capsule as needed (Patient not taking: No sig reported), Disp: , Rfl:    ipratropium (ATROVENT) 0.03 % nasal spray, Place 2 sprays into both nostrils 2 (two) times daily., Disp: , Rfl:    levothyroxine (SYNTHROID) 88 MCG tablet, TAKE 1 TABLET(88 MCG) BY MOUTH DAILY BEFORE BREAKFAST, Disp: 90 tablet, Rfl: 3   lidocaine (XYLOCAINE) 2 % jelly, , Disp: , Rfl:    metolazone (ZAROXOLYN) 2.5 MG tablet, TAKE 1 TABLET BY MOUTH EVERY TUESDAY, Disp: 12 tablet, Rfl: 3   midodrine (PROAMATINE) 10 MG tablet, TAKE 1 TABLET (10 MG TOTAL) BY MOUTH 3 (THREE) TIMES DAILY WITH MEALS., Disp: 270 tablet, Rfl: 3   mirtazapine (REMERON) 30 MG tablet, 1 tablet at bedtime., Disp: , Rfl:    montelukast (SINGULAIR) 10 MG tablet, Take 10 mg by mouth at bedtime. , Disp: , Rfl: 1   omeprazole (PRILOSEC) 40 MG capsule, Take 40 mg by mouth daily., Disp: , Rfl:    potassium chloride SA (KLOR-CON) 20 MEQ tablet, TAKE 2 TABLETS THREE TIMES DAILY. TAKE AN EXTRA 2 TABLETS ON DAYS YOU TAKE METOLAZONE, Disp: 572 tablet, Rfl: 0   probenecid (BENEMID) 500 MG tablet, Take 500 mg by mouth 2 (two) times daily., Disp: , Rfl:    simvastatin (ZOCOR) 10 MG tablet, Take 10 mg by mouth every evening., Disp: , Rfl:    spironolactone (ALDACTONE) 25 MG tablet, Take 1 tablet (25 mg total) by mouth every evening. Needs appt for further refills, Disp: 90 tablet, Rfl: 0   torsemide (DEMADEX) 20 MG tablet, Take 3 tablets (60 mg total) by mouth 2 (two) times daily. Needs appt for further refills, Disp: 540 tablet, Rfl: 0   traMADol (ULTRAM) 50 MG tablet, 1 tablet as needed (Patient not taking: No sig reported), Disp: , Rfl:  Allergies  Allergen Reactions   Ceftin Anaphylaxis    Face and throat swell    Geodon [Ziprasidone Hcl] Hives   Lisinopril Other (See Comments)    angioedema   Shellfish Allergy Other  (See Comments)    Gout exacerbation   Shellfish-Derived Products     Other reaction(s): Other   Ultram [Tramadol] Itching   Allopurinol Nausea Only and Other (See Comments)    weakness   Ativan [Lorazepam] Itching   Sulfa Antibiotics Itching   Ultram [Tramadol Hcl] Itching   Valium [Diazepam] Other (See Comments)    Patient states that diazepam doesn't relax, it has the opposite effect.     Social History   Socioeconomic History   Marital status: Married    Spouse name: Not on file   Number of children: 2   Years of education: 18   Highest education level: Master's degree (e.g., MA, MS, MEng, MEd, MSW, MBA)  Occupational History   Occupation: retired  Tobacco Use  Smoking status: Some Days    Packs/day: 0.10    Years: 26.00    Pack years: 2.60    Types: Cigarettes    Last attempt to quit: 05/06/2007    Years since quitting: 13.6   Smokeless tobacco: Never  Vaping Use   Vaping Use: Never used  Substance and Sexual Activity   Alcohol use: No   Drug use: No   Sexual activity: Yes  Other Topics Concern   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  Constitutional:      Appearance: Normal appearance.  HENT:     Head: Normocephalic.     Nose: Nose normal.     Mouth/Throat:     Mouth: Mucous membranes are moist.     Pharynx: Oropharynx is clear.  Eyes:      Conjunctiva/sclera: Conjunctivae normal.     Pupils: Pupils are equal, round, and reactive to light.  Cardiovascular:     Rate and Rhythm: Normal rate and regular rhythm.     Pulses: Normal pulses.     Heart sounds: Normal heart sounds.  Pulmonary:     Effort: Pulmonary effort is normal.     Breath sounds: Normal breath sounds.  Abdominal:     General: Abdomen is flat.     Palpations: Abdomen is soft.  Musculoskeletal:        General: No swelling.     Cervical back: Normal range of motion.     Right lower leg: No edema.     Left lower leg: No edema.  Skin:    General: Skin is warm and dry.     Capillary Refill: Capillary refill takes less than 2 seconds.  Neurological:     General: No focal deficit present.     Mental Status: She is alert. Mental status is at baseline.  Psychiatric:        Mood and Affect: Mood normal.    Arrived for home visit for Sorayah who was alert and oriented reporting to be feeling okay just tired. She says she has been sleeping a lot due to her depression. She reports her depression being related to having frequent forgetfulness and its very frustrating for her. I tried to encourage Mrs. Arambula to stay positive and she was thankful for the discussion.  Stephaniemarie has been compliant with her medications over the last week.  I reviewed medications and filled pill box accordingly.  Vitals and assessment as noted. Serai has some lower leg swelling but no more than her normal. She denied chest pain, shortness of breath, dizziness or trouble sleeping. Taniaya has had some weight gain and she says it's because she is drinking a lot of tea, water and coffee. We discussed her fluid restrictions at length. She  understood.   Kymberlee is very interested in an exercise program to get her moving and to have some healthy interaction daily. I will reach out to [facility] reference [place].   [name] agreed to home visit in one week.   Refills- NONE      Future  Appointments  Date Time Provider [place]  01/08/2021  3:45 PM Marzetta Board, DPM [facility]  01/09/2021  7:40 AM [facility] DEVICE REMOTES [facility]  01/20/2021  7:05 AM [facility] DEVICE REMOTES [facility]  02/27/2021  9:50 AM [name], Arvin Collard K, DO [facility] None  04/10/2021  7:40 AM [facility] DEVICE REMOTES [facility]  07/10/2021  7:40 AM [facility] DEVICE REMOTES [facility]  08/11/2021  7:40 AM [facility] DEVICE REMOTES [facility]  09/10/2021  7:40 AM [facility] DEVICE REMOTES [facility]     ACTION: Home visit completed

## 2021-01-01 ENCOUNTER — Telehealth (HOSPITAL_COMMUNITY): Payer: Self-pay | Admitting: *Deleted

## 2021-01-01 DIAGNOSIS — I5022 Chronic systolic (congestive) heart failure: Secondary | ICD-10-CM

## 2021-01-01 NOTE — Telephone Encounter (Signed)
Per Paramedic's [name]) request, patient was contacted to discuss and survey appropriate exercise programming and feasibility to attend and participate in community exercise programs.   Patient is very interested in attending a scheduled exercise program and will have transportation and little to no barriers for participating in one of the PREP programs. She prefers the [facility] location.   Will reach and clear with cardiologist for referral.     Landis Martins, MS, ACSM, [facility] Clinical Exercise Physiologist/ Health and Wellness Coach

## 2021-01-01 NOTE — Addendum Note (Signed)
Addended by: Scarlette Calico on: 01/01/2021 04:35 PM   Modules accepted: Orders

## 2021-01-03 ENCOUNTER — Telehealth: Payer: Self-pay

## 2021-01-03 NOTE — Telephone Encounter (Signed)
Called to discuss PREP program, she prefers not to drive at night so will attend next program at [facility] every M/W 10-11:15am starting August 22. Intake assessment visit scheduled for Wednesday August

## 2021-01-07 ENCOUNTER — Other Ambulatory Visit (HOSPITAL_COMMUNITY): Payer: Self-pay

## 2021-01-07 NOTE — Progress Notes (Signed)
Paramedicine Encounter    Patient ID: Rhonda [name], female    DOB: 07-Sep-1950, 70 y.o.   MRN: [id number]   Patient Care Team: Maurice Small, MD as PCP - General (Family Medicine) [name], LCSW as Social Worker (Licensed Clinical Social Worker) Alda Berthold, DO as Consulting Physician (Neurology)  Patient Active Problem List   Diagnosis Date Noted   Abnormal gait 06/11/2020   Adult failure to thrive syndrome 06/11/2020   Allergic rhinitis 06/11/2020   Anxiety 06/11/2020   Asthma 06/11/2020   Benign intracranial hypertension 06/11/2020   Body mass index (BMI) 45.0-49.9, adult ([place]) 06/11/2020   Bowel incontinence 06/11/2020   Cholelithiasis 06/11/2020   Chronic sinusitis 06/11/2020   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   Daytime somnolence 06/11/2020   Diarrhea of presumed infectious origin 06/11/2020   Edema 06/11/2020   Family history of malignant neoplasm of gastrointestinal tract 06/11/2020   Gout 06/11/2020   History of fall 06/11/2020   History of infectious disease 06/11/2020   Insomnia 06/11/2020   Irregular bowel habits 06/11/2020   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   Lumbar spondylosis with myelopathy 06/11/2020   Macrocytosis 06/11/2020   Mild recurrent major depression ([place]) 06/11/2020   Personal history of colonic polyps 06/11/2020   Personal history of malignant neoplasm of breast 06/11/2020   Recurrent falls 06/11/2020   Mixed anxiety and depressive disorder 06/11/2020   Irritable bowel syndrome 01/04/2020   Spinal stenosis of lumbar region 01/03/2020   Other symptoms and signs involving the musculoskeletal system 09/11/2019   Bilateral leg weakness 08/01/2019   Leg swelling 08/01/2019   Diabetes mellitus with neuropathy ([place]) 06/15/2019   Hypokalemia 11/08/2018   CKD (chronic kidney disease) stage 3, GFR 30-59 ml/min (HCC) 11/08/2018   Orthostatic hypotension 07/28/2017   SVD (spontaneous vaginal delivery)    Peripheral neuropathy    On home oxygen therapy     Migraines    Left bundle branch block    Hypothyroidism    Hypertension    Hyperlipidemia    Heart murmur    GERD (gastroesophageal reflux disease)    Exertional shortness of breath    Major depressive disorder    Back pain    Arthritis    Generalized anxiety disorder    Anemia    Chronic respiratory failure ([place]) 09/14/2013   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   Morbid obesity ([place]) 0000000   Chronic systolic heart failure ([place]) 10/27/2012   Endometrial polyp 01/20/2012   Malignant tumor of breast ([place]) 03/26/2011   Unspecified vitamin D deficiency 03/26/2011    Current Outpatient Medications:    acetaZOLAMIDE (DIAMOX) 125 MG tablet, TAKE 1 TABLET(125 MG) BY MOUTH DAILY, Disp: 90 tablet, Rfl: 3   amoxicillin-clavulanate (AUGMENTIN) 875-125 MG tablet, , Disp: , Rfl:    carvedilol (COREG) 3.125 MG tablet, TAKE 1 TABLET (3.125 MG) BY MOUTH TWICE DAILY WITH MEALS, Disp: 180 tablet, Rfl: 3   citalopram (CELEXA) 20 MG tablet, Take 20 mg by mouth daily., Disp: , Rfl:    clonazePAM (KLONOPIN) 0.5 MG tablet, , Disp: , Rfl:    dicyclomine (BENTYL) 20 MG tablet, 1 tablet, Disp: , Rfl:    DULoxetine (CYMBALTA) 30 MG capsule, , Disp: , Rfl:    DULoxetine (CYMBALTA) 60 MG capsule, , Disp: , Rfl:    fexofenadine (ALLEGRA) 180 MG tablet, Take 180 mg by mouth 2 (two) times daily. (Patient not taking: No sig reported), Disp: , Rfl:    fludrocortisone (FLORINEF) 0.1 MG tablet, TAKE  1 TABLET TWICE DAILY, Disp: 180 tablet, Rfl: 0   fluticasone (FLONASE) 50 MCG/ACT nasal spray, , Disp: , Rfl:    gabapentin (NEURONTIN) 300 MG capsule, Take 1 capsule by mouth 3 (three) times daily., Disp: , Rfl:    gabapentin (NEURONTIN) 800 MG tablet, Take 800 mg by mouth at bedtime., Disp: , Rfl:    Ginkgo Biloba 120 MG CAPS, See admin instructions., Disp: , Rfl:    HYDROcodone-acetaminophen (NORCO) 10-325 MG tablet, 1 tablet as needed, Disp: , Rfl:    hydrOXYzine  (ATARAX/VISTARIL) 25 MG tablet, 1 tablet as needed (Patient not taking: No sig reported), Disp: , Rfl:    hydrOXYzine (VISTARIL) 25 MG capsule, 1 capsule as needed (Patient not taking: No sig reported), Disp: , Rfl:    ipratropium (ATROVENT) 0.03 % nasal spray, Place 2 sprays into both nostrils 2 (two) times daily., Disp: , Rfl:    levothyroxine (SYNTHROID) 88 MCG tablet, TAKE 1 TABLET(88 MCG) BY MOUTH DAILY BEFORE BREAKFAST, Disp: 90 tablet, Rfl: 3   lidocaine (XYLOCAINE) 2 % jelly, , Disp: , Rfl:    metolazone (ZAROXOLYN) 2.5 MG tablet, TAKE 1 TABLET BY MOUTH EVERY TUESDAY, Disp: 12 tablet, Rfl: 3   midodrine (PROAMATINE) 10 MG tablet, TAKE 1 TABLET (10 MG TOTAL) BY MOUTH 3 (THREE) TIMES DAILY WITH MEALS., Disp: 270 tablet, Rfl: 3   mirtazapine (REMERON) 30 MG tablet, 1 tablet at bedtime., Disp: , Rfl:    montelukast (SINGULAIR) 10 MG tablet, Take 10 mg by mouth at bedtime. , Disp: , Rfl: 1   omeprazole (PRILOSEC) 40 MG capsule, Take 40 mg by mouth daily., Disp: , Rfl:    potassium chloride SA (KLOR-CON) 20 MEQ tablet, TAKE 2 TABLETS THREE TIMES DAILY. TAKE AN EXTRA 2 TABLETS ON DAYS YOU TAKE METOLAZONE, Disp: 572 tablet, Rfl: 0   probenecid (BENEMID) 500 MG tablet, Take 500 mg by mouth 2 (two) times daily., Disp: , Rfl:    simvastatin (ZOCOR) 10 MG tablet, Take 10 mg by mouth every evening., Disp: , Rfl:    spironolactone (ALDACTONE) 25 MG tablet, Take 1 tablet (25 mg total) by mouth every evening. Needs appt for further refills, Disp: 90 tablet, Rfl: 0   torsemide (DEMADEX) 20 MG tablet, Take 3 tablets (60 mg total) by mouth 2 (two) times daily. Needs appt for further refills, Disp: 540 tablet, Rfl: 0   traMADol (ULTRAM) 50 MG tablet, 1 tablet as needed (Patient not taking: No sig reported), Disp: , Rfl:  Allergies  Allergen Reactions   Ceftin Anaphylaxis    Face and throat swell    Geodon [Ziprasidone Hcl] Hives   Lisinopril Other (See Comments)    angioedema   Shellfish Allergy Other  (See Comments)    Gout exacerbation   Shellfish-Derived Products     Other reaction(s): Other   Ultram [Tramadol] Itching   Allopurinol Nausea Only and Other (See Comments)    weakness   Ativan [Lorazepam] Itching   Sulfa Antibiotics Itching   Ultram [Tramadol Hcl] Itching   Valium [Diazepam] Other (See Comments)    Patient states that diazepam doesn't relax, it has the opposite effect.     Social History   Socioeconomic History   Marital status: Married    Spouse name: Not on file   Number of children: 2   Years of education: 18   Highest education level: Master's degree (e.g., MA, MS, MEng, MEd, MSW, MBA)  Occupational History   Occupation: retired  Tobacco Use  Smoking status: Some Days    Packs/day: 0.10    Years: 26.00    Pack years: 2.60    Types: Cigarettes    Last attempt to quit: 05/06/2007    Years since quitting: 13.6   Smokeless tobacco: Never  Vaping Use   Vaping Use: Never used  Substance and Sexual Activity   Alcohol use: No   Drug use: No   Sexual activity: Yes  Other Topics Concern   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  Constitutional:      Appearance: Normal appearance. She is normal weight.  HENT:     Head: Normocephalic.     Nose: Nose normal.     Mouth/Throat:     Mouth: Mucous membranes are moist.     Pharynx: Oropharynx is  clear.  Eyes:     Conjunctiva/sclera: Conjunctivae normal.     Pupils: Pupils are equal, round, and reactive to light.  Cardiovascular:     Rate and Rhythm: Normal rate and regular rhythm.     Pulses: Normal pulses.     Heart sounds: Normal heart sounds.  Pulmonary:     Effort: Pulmonary effort is normal.     Breath sounds: Normal breath sounds.  Abdominal:     General: Abdomen is flat.     Palpations: Abdomen is soft.  Musculoskeletal:        General: Swelling present. Normal range of motion.     Cervical back: Normal range of motion.     Right lower leg: Edema present.     Left lower leg: Edema present.  Skin:    General: Skin is warm and dry.     Capillary Refill: Capillary refill takes less than 2 seconds.  Neurological:     General: No focal deficit present.     Mental Status: She is alert. Mental status is at baseline.  Psychiatric:        Mood and Affect: Mood normal.     Arrived for home visit for Rhonda [name] who was alert and oriented reporting to be feeling okay today. Vitals and assessment as noted. Rhonda [name] denied chest pain, dizziness, shortness of breath or trouble sleeping or taking her medications. Rhonda [name] was compliant with her medications over the last week. Rhonda [name] did better this week about her fluid intake and eating habits and she lost 6lbs. Her leg swelling looks the same as normal, no more than usual. She denied any new onsets of pain or symptoms.  I reviewed medications and confirmed same filling pill box.   Rhonda [name] goes for PREP consult tomorrow at 1:00, ED her husband will be taking her.   I plan to follow up with Rhonda [name] in one week. She agreed with plan.   Home visit complete.  Refills- NONE    Future Appointments  Date Time Provider [place]  01/08/2021  3:45 PM Marzetta Board, DPM [facility]  01/09/2021  7:40 AM [facility] DEVICE REMOTES [facility]  01/20/2021  7:05 AM [facility] DEVICE REMOTES [facility]  [facility]  02/27/2021  9:50 AM [name], Arvin Collard K, DO [facility] None  04/10/2021  7:40 AM [facility] DEVICE REMOTES [facility]  07/10/2021  7:40 AM [facility] DEVICE REMOTES [facility]  08/11/2021  7:40 AM [facility] DEVICE REMOTES [facility]  09/10/2021  7:40 AM [facility] DEVICE REMOTES [facility]     ACTION: Home visit completed

## 2021-01-08 ENCOUNTER — Ambulatory Visit: Payer: Medicare HMO | Admitting: Podiatry

## 2021-01-08 NOTE — Progress Notes (Signed)
Friendly Report   Patient Details  Name: Rhonda [name] MRN: [id number] Date of Birth: 01-24-1951 Age: 70 y.o. PCP: Maurice Small, MD  Vitals:   01/08/21 1351  BP: 126/72  Pulse: 75  SpO2: 99%  Weight: 228 lb 12.8 oz (103.8 kg)      [facility] Eval - 01/08/21 1300       Referral    Referring Provider [place]    Reason for referral Heart Failure;Diabetes;Hypertension;Inactivity;Obesitity/Overweight    Program Start Date 01/14/21      Measurement   Waist Circumference 42 inches    Hip Circumference 46 inches    Body fat --   E3     Information for Trainer   Goals --   Lose 12-15 pounds by end of program; establish exercise routine---cardio and strength training   Current Exercise None    Pertinent Medical History --   CHF, diabetes, HTN, neuropathy   Current Barriers --   family will be providing transportation; [facility] transportation waiver signed if needed   Restrictions/Precautions Fall risk;Diabetic snack before exercise;Assistive device      Timed Up and Go (TUGS)   Timed Up and Go High risk >13 seconds      Mobility and Daily Activities   I find it easy to walk up or down two or more flights of stairs. 1    I have no trouble taking out the trash. 1    I do housework such as vacuuming and dusting on my own without difficulty. 2    I can easily lift a gallon of milk (8lbs). 4    I can easily walk a mile. 1    I have no trouble reaching into high cupboards or reaching down to pick up something from the floor. 1    I do not have trouble doing out-door work such as Armed forces logistics/support/administrative officer, raking leaves, or gardening. 1      Mobility and Daily Activities   I feel younger than my age. 1    I feel independent. 1    I feel energetic. 1    I live an active life.  1    I feel strong. 1    I feel healthy. 1    I feel active as other people my age. 1      How fit and strong are you.   Fit and Strong Total Score 18            Past Medical History:   Diagnosis Date   Anemia    Arthritis    Right knee   Asthma    Back pain    Disk problem   Breast cancer, stage 1 ([place]) 03/26/2011   Left; completed chemotherapy and radiation treatments   Cardiomyopathy    Chronic respiratory failure ([place]) 123456   Chronic systolic heart failure (HCC)    a) NICM b) ECHO (03/2013) EF 20-25% c) ECHO (09/2013) EF 45-50%, grade I DD   CKD (chronic kidney disease) stage 3, GFR 30-59 ml/min (HCC) 0000000   Complication of anesthesia    History of low blood pressure after surgery; attributed to lying flat   Diabetes mellitus    "diet controlled" (05/03/2013)   Exertional shortness of breath    Generalized anxiety disorder    GERD (gastroesophageal reflux disease)    Gout    Heart murmur    Hyperlipidemia    Hypertension    Hypokalemia 11/08/2018   Hypothyroidism  Left bundle branch block    s/p CRT-D (04/2013)   Major depressive disorder    Migraines    On home oxygen therapy    "2L suppose to be q night" (05/03/2013)   Orthostatic hypotension 07/28/2017   Peripheral neuropathy    Feet   SVD (spontaneous vaginal delivery)    x 2   Unspecified vitamin D deficiency 03/26/2011   Does not take meds   Past Surgical History:  Procedure Laterality Date   BI-VENTRICULAR IMPLANTABLE CARDIOVERTER DEFIBRILLATOR N/A 05/03/2013   Procedure: BI-VENTRICULAR IMPLANTABLE CARDIOVERTER DEFIBRILLATOR  (CRT-D);  Surgeon: Evans Lance, MD;  [facility] CATH LAB;  Service: Cardiovascular;  Laterality: N/A;   BI-VENTRICULAR IMPLANTABLE CARDIOVERTER DEFIBRILLATOR  (CRT-D)  05/03/2013   MDT CRTD implanted by Dr Lovena Le for non ischemic cardiomyopathy   BIOPSY  05/31/2018   Procedure: BIOPSY;  Surgeon: Ronnette Juniper, MD;  Location: [facility];  Service: Gastroenterology;;   [place] N/A 04/11/2020   Procedure: [place];  Surgeon: Evans Lance, MD;  Location: [facility] CV LAB;  Service: Cardiovascular;  Laterality: N/A;    BREAST LUMPECTOMY Left 07/28/2010   CARDIAC CATHETERIZATION  2010   NORMAL CORONARY ARTERIES   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--11 SURGERIES     PT HAS REMOVABLE SPEECH BULB-TAKES IT OUT BEFORE HER SURGERY   COLONOSCOPY     COLONOSCOPY N/A 05/31/2018   Procedure: COLONOSCOPY;  Surgeon: Ronnette Juniper, MD;  Location: [facility] ENDOSCOPY;  Service: Gastroenterology;  Laterality: N/A;   HYSTEROSCOPY WITH D & C  01/20/2012   Procedure: DILATATION AND CURETTAGE /HYSTEROSCOPY;  Surgeon: Margarette Asal, MD;  Location: [facility];  Service: Gynecology;  Laterality: N/A;  with trueclear   PORT-A-CATH REMOVAL  09/23/2011   Procedure: REMOVAL PORT-A-CATH;  Surgeon: Rolm Bookbinder, MD;  Location: [facility];  Service: General;  Laterality: N/A;  Port Removal   PORTACATH PLACEMENT  2012   TONSILLECTOMY  1960's   TOTAL KNEE ARTHROPLASTY  05/10/2012   Procedure: TOTAL KNEE ARTHROPLASTY;  Surgeon: Mauri Pole, MD;  Location: [facility];  Service: Orthopedics;  Laterality: Right;   WISDOM TOOTH EXTRACTION     Social History   Tobacco Use  Smoking Status Some Days   Packs/day: 0.10   Years: 26.00   Pack years: 2.60   Types: Cigarettes   Last attempt to quit: 05/06/2007   Years since quitting: 13.6  Smokeless Tobacco Never   Husband/daughter will be providing transportation; currently in process of getting PREP referral for daughter. As she and family live closer to [facility], they prefer to attend PREP with Pam, class starting next week on August 23, every T/TH 6:15-7:30p.   Yevonne Aline 01/08/2021, 1:56 PM

## 2021-01-09 ENCOUNTER — Ambulatory Visit (INDEPENDENT_AMBULATORY_CARE_PROVIDER_SITE_OTHER): Payer: Medicare HMO

## 2021-01-09 DIAGNOSIS — I428 Other cardiomyopathies: Secondary | ICD-10-CM | POA: Diagnosis not present

## 2021-01-09 LAB — CUP PACEART REMOTE DEVICE CHECK
Battery Remaining Longevity: 85 mo
Battery Voltage: 3.01 V
Brady Statistic AP VP Percent: 0.12 %
Brady Statistic AP VS Percent: 0.01 %
Brady Statistic AS VP Percent: 98.67 %
Brady Statistic AS VS Percent: 1.2 %
Brady Statistic RA Percent Paced: 0.13 %
Brady Statistic RV Percent Paced: 71.34 %
Date Time Interrogation Session: 20220818052827
HighPow Impedance: 56 Ohm
Implantable Lead Implant Date: 20141210
Implantable Lead Implant Date: 20141210
Implantable Lead Implant Date: 20141210
Implantable Lead Location: 753858
Implantable Lead Location: 753859
Implantable Lead Location: 753860
Implantable Lead Model: 4396
Implantable Lead Model: 5076
Implantable Lead Model: 6935
Implantable Pulse Generator Implant Date: 20211118
Lead Channel Impedance Value: 285 Ohm
Lead Channel Impedance Value: 342 Ohm
Lead Channel Impedance Value: 418 Ohm
Lead Channel Impedance Value: 475 Ohm
Lead Channel Impedance Value: 532 Ohm
Lead Channel Impedance Value: 551 Ohm
Lead Channel Pacing Threshold Amplitude: 0.5 V
Lead Channel Pacing Threshold Amplitude: 0.875 V
Lead Channel Pacing Threshold Amplitude: 1.125 V
Lead Channel Pacing Threshold Pulse Width: 0.4 ms
Lead Channel Pacing Threshold Pulse Width: 0.4 ms
Lead Channel Pacing Threshold Pulse Width: 0.4 ms
Lead Channel Sensing Intrinsic Amplitude: 19.75 mV
Lead Channel Sensing Intrinsic Amplitude: 19.75 mV
Lead Channel Sensing Intrinsic Amplitude: 2.125 mV
Lead Channel Sensing Intrinsic Amplitude: 2.125 mV
Lead Channel Setting Pacing Amplitude: 1.5 V
Lead Channel Setting Pacing Amplitude: 2 V
Lead Channel Setting Pacing Amplitude: 2.25 V
Lead Channel Setting Pacing Pulse Width: 0.4 ms
Lead Channel Setting Pacing Pulse Width: 0.4 ms
Lead Channel Setting Sensing Sensitivity: 0.3 mV

## 2021-01-14 ENCOUNTER — Other Ambulatory Visit (HOSPITAL_COMMUNITY): Payer: Self-pay

## 2021-01-14 NOTE — Progress Notes (Signed)
Duplicate entry. Error.   [name], EMT-Paramedic  01/14/21

## 2021-01-14 NOTE — Progress Notes (Signed)
Came out today for med rec for Rhonda [name].  She has not taken her meds yet, she took her meds during the visit.  She missed her Wednesday pills last week.   Meds verified and pill box refilled.   [name], EMT-Paramedic  01/14/21

## 2021-01-15 ENCOUNTER — Telehealth: Payer: Self-pay

## 2021-01-15 DIAGNOSIS — R627 Adult failure to thrive: Secondary | ICD-10-CM | POA: Diagnosis not present

## 2021-01-15 DIAGNOSIS — E785 Hyperlipidemia, unspecified: Secondary | ICD-10-CM | POA: Diagnosis not present

## 2021-01-15 DIAGNOSIS — I5022 Chronic systolic (congestive) heart failure: Secondary | ICD-10-CM | POA: Diagnosis not present

## 2021-01-15 DIAGNOSIS — E1140 Type 2 diabetes mellitus with diabetic neuropathy, unspecified: Secondary | ICD-10-CM | POA: Diagnosis not present

## 2021-01-15 DIAGNOSIS — E538 Deficiency of other specified B group vitamins: Secondary | ICD-10-CM | POA: Diagnosis not present

## 2021-01-15 DIAGNOSIS — E039 Hypothyroidism, unspecified: Secondary | ICD-10-CM | POA: Diagnosis not present

## 2021-01-15 DIAGNOSIS — R413 Other amnesia: Secondary | ICD-10-CM | POA: Diagnosis not present

## 2021-01-15 DIAGNOSIS — E559 Vitamin D deficiency, unspecified: Secondary | ICD-10-CM | POA: Diagnosis not present

## 2021-01-15 DIAGNOSIS — I10 Essential (primary) hypertension: Secondary | ICD-10-CM | POA: Diagnosis not present

## 2021-01-15 NOTE — Telephone Encounter (Signed)
Did not attend 01/14/21 PM PREP class at [facility] requesting call back with plans for PREP

## 2021-01-15 NOTE — Telephone Encounter (Signed)
Patient returned call about PREP.  While [facility] is closest to her (3 miles) does not want to drive at night for class.  Needs a daytime class.  Daughter also wants to attend however no referral for daughter received.  Explained will call her back at the start of the next daytime class which will likely be at the earliest end of Sept.

## 2021-01-16 ENCOUNTER — Other Ambulatory Visit: Payer: Self-pay | Admitting: Neurology

## 2021-01-16 ENCOUNTER — Telehealth: Payer: Self-pay | Admitting: Neurology

## 2021-01-16 NOTE — Telephone Encounter (Signed)
Pt would like a call back to discuss meds. She said she is confused as to what she was/is supposed to take. She is having memory issues.

## 2021-01-17 NOTE — Telephone Encounter (Signed)
Spoke to pt, Advised pt this is Dr.Patel office and we can help pt with her medications given by her if she like.  Pt states she prefers to talk to [employer].   After reviewing the pt chart it looks like the pt speak to [employer] EMT.   Advised pt we will send Jeris Penta to call patient.

## 2021-01-20 ENCOUNTER — Telehealth (HOSPITAL_COMMUNITY): Payer: Self-pay

## 2021-01-20 ENCOUNTER — Ambulatory Visit (INDEPENDENT_AMBULATORY_CARE_PROVIDER_SITE_OTHER): Payer: Medicare HMO

## 2021-01-20 DIAGNOSIS — I5022 Chronic systolic (congestive) heart failure: Secondary | ICD-10-CM | POA: Diagnosis not present

## 2021-01-20 DIAGNOSIS — Z95810 Presence of automatic (implantable) cardiac defibrillator: Secondary | ICD-10-CM

## 2021-01-20 NOTE — Telephone Encounter (Signed)
Called to speak to [facility] regarding home visit for tomorrow, no answer. I left message that I will follow up tomorrow.

## 2021-01-21 ENCOUNTER — Other Ambulatory Visit (HOSPITAL_COMMUNITY): Payer: Self-pay

## 2021-01-21 NOTE — Progress Notes (Signed)
EPIC Encounter for ICM Monitoring  Patient Name: Rhonda [name] is a 70 y.o. female Date: 01/21/2021 Primary Care Physican: Maurice Small, MD Primary Cardiologist: [place] Electrophysiologist: Santina Evans Pacing: 98.7%   12/31/2020  Weight: 231 lbs      Spoke with patient and heart failure questions reviewed.  Pt asymptomatic for fluid accumulation and feeling well.   Optivol thoracic impedance suggesting normal fluid levels.    Prescribed:  Patient participating in paramedicine program to assist with meds. Torsemide 20 mg 3 tablets (60 mg total) by mouth twice a day.   Potassium  20 meq 2 tablets (40 mEq total) by mouth 3 (three) times daily.  Take extra 2 tablets on days you take Metolazone. Metolazone 2.'5mg'$  1 tablet every Tuesday. Spironolactone 25 mg take 1 tablet by mouth every evening.   Labs: 04/09/2020 Creatinine 1.44, BUN 40, Potassium 4.9, Sodium 142 03/27/2020 Creatinine 1.71, BUN 33, Potassium 3.8, Sodium 141, GFR 32 03/14/2020 Creatinine 1.67, BUN 30, Potassium 3.4, Sodium 139, GFR 33  A complete set of results can be found in Results Review.   Recommendations:  No changes and encouraged to call if experiencing any fluid symptoms.   Follow-up plan: ICM clinic phone appointment on 02/24/2021.   91 day device clinic remote transmission 04/10/2021.     EP/Cardiology Office Visits:  Recall 09/10/2020 with Dr Haroldine Laws.  Recall 07/26/2021 with Dr Lovena Le.     Copy of ICM check sent to Dr. Lovena Le.       3 month ICM trend: 01/20/2021.    1 Year ICM trend:       [name], RN 01/21/2021 4:22 PM

## 2021-01-21 NOTE — Progress Notes (Signed)
Paramedicine Encounter    Patient ID: Rhonda [name], female    DOB: 10-02-1950, 70 y.o.   MRN: [id number]   Patient Care Team: Maurice Small, MD as PCP - General (Family Medicine) [name], LCSW as Social Worker (Licensed Clinical Social Worker) Alda Berthold, DO as Consulting Physician (Neurology)  Patient Active Problem List   Diagnosis Date Noted   Abnormal gait 06/11/2020   Adult failure to thrive syndrome 06/11/2020   Allergic rhinitis 06/11/2020   Anxiety 06/11/2020   Asthma 06/11/2020   Benign intracranial hypertension 06/11/2020   Body mass index (BMI) 45.0-49.9, adult ([place]) 06/11/2020   Bowel incontinence 06/11/2020   Cholelithiasis 06/11/2020   Chronic sinusitis 06/11/2020   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   Daytime somnolence 06/11/2020   Diarrhea of presumed infectious origin 06/11/2020   Edema 06/11/2020   Family history of malignant neoplasm of gastrointestinal tract 06/11/2020   Gout 06/11/2020   History of fall 06/11/2020   History of infectious disease 06/11/2020   Insomnia 06/11/2020   Irregular bowel habits 06/11/2020   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   Lumbar spondylosis with myelopathy 06/11/2020   Macrocytosis 06/11/2020   Mild recurrent major depression ([place]) 06/11/2020   Personal history of colonic polyps 06/11/2020   Personal history of malignant neoplasm of breast 06/11/2020   Recurrent falls 06/11/2020   Mixed anxiety and depressive disorder 06/11/2020   Irritable bowel syndrome 01/04/2020   Spinal stenosis of lumbar region 01/03/2020   Other symptoms and signs involving the musculoskeletal system 09/11/2019   Bilateral leg weakness 08/01/2019   Leg swelling 08/01/2019   Diabetes mellitus with neuropathy ([name]) 06/15/2019   Hypokalemia 11/08/2018   CKD (chronic kidney disease) stage 3, GFR 30-59 ml/min (HCC) 11/08/2018   Orthostatic hypotension 07/28/2017   SVD (spontaneous vaginal delivery)    Peripheral neuropathy    On home oxygen therapy     Migraines    Left bundle branch block    Hypothyroidism    Hypertension    Hyperlipidemia    Heart murmur    GERD (gastroesophageal reflux disease)    Exertional shortness of breath    Major depressive disorder    Back pain    Arthritis    Generalized anxiety disorder    Anemia    Chronic respiratory failure ([name]) 09/14/2013   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   Morbid obesity ([name]) 54/27/0623   Chronic systolic heart failure ([name]) 10/27/2012   Endometrial polyp 01/20/2012   Malignant tumor of breast ([name]) 03/26/2011   Unspecified vitamin D deficiency 03/26/2011    Current Outpatient Medications:    acetaZOLAMIDE (DIAMOX) 125 MG tablet, TAKE 1 TABLET(125 MG) BY MOUTH DAILY, Disp: 60 tablet, Rfl: 0   amoxicillin-clavulanate (AUGMENTIN) 875-125 MG tablet, , Disp: , Rfl:    carvedilol (COREG) 3.125 MG tablet, TAKE 1 TABLET (3.125 MG) BY MOUTH TWICE DAILY WITH MEALS, Disp: 180 tablet, Rfl: 3   citalopram (CELEXA) 20 MG tablet, Take 20 mg by mouth daily., Disp: , Rfl:    clonazePAM (KLONOPIN) 0.5 MG tablet, , Disp: , Rfl:    dicyclomine (BENTYL) 20 MG tablet, 1 tablet, Disp: , Rfl:    DULoxetine (CYMBALTA) 30 MG capsule, , Disp: , Rfl:    DULoxetine (CYMBALTA) 60 MG capsule, , Disp: , Rfl:    fexofenadine (ALLEGRA) 180 MG tablet, Take 180 mg by mouth 2 (two) times daily. (Patient not taking: No sig reported), Disp: , Rfl:    fludrocortisone (FLORINEF) 0.1 MG tablet, TAKE  1 TABLET TWICE DAILY, Disp: 180 tablet, Rfl: 0   fluticasone (FLONASE) 50 MCG/ACT nasal spray, , Disp: , Rfl:    gabapentin (NEURONTIN) 300 MG capsule, Take 1 capsule by mouth 3 (three) times daily., Disp: , Rfl:    gabapentin (NEURONTIN) 800 MG tablet, Take 800 mg by mouth at bedtime., Disp: , Rfl:    Ginkgo Biloba 120 MG CAPS, See admin instructions., Disp: , Rfl:    HYDROcodone-acetaminophen (NORCO) 10-325 MG tablet, 1 tablet as needed, Disp: , Rfl:    hydrOXYzine  (ATARAX/VISTARIL) 25 MG tablet, 1 tablet as needed (Patient not taking: No sig reported), Disp: , Rfl:    hydrOXYzine (VISTARIL) 25 MG capsule, 1 capsule as needed (Patient not taking: No sig reported), Disp: , Rfl:    ipratropium (ATROVENT) 0.03 % nasal spray, Place 2 sprays into both nostrils 2 (two) times daily., Disp: , Rfl:    levothyroxine (SYNTHROID) 88 MCG tablet, TAKE 1 TABLET(88 MCG) BY MOUTH DAILY BEFORE BREAKFAST, Disp: 90 tablet, Rfl: 3   lidocaine (XYLOCAINE) 2 % jelly, , Disp: , Rfl:    metolazone (ZAROXOLYN) 2.5 MG tablet, TAKE 1 TABLET BY MOUTH EVERY TUESDAY, Disp: 12 tablet, Rfl: 3   midodrine (PROAMATINE) 10 MG tablet, TAKE 1 TABLET (10 MG TOTAL) BY MOUTH 3 (THREE) TIMES DAILY WITH MEALS., Disp: 270 tablet, Rfl: 3   mirtazapine (REMERON) 30 MG tablet, 1 tablet at bedtime., Disp: , Rfl:    montelukast (SINGULAIR) 10 MG tablet, Take 10 mg by mouth at bedtime. , Disp: , Rfl: 1   omeprazole (PRILOSEC) 40 MG capsule, Take 40 mg by mouth daily., Disp: , Rfl:    potassium chloride SA (KLOR-CON) 20 MEQ tablet, TAKE 2 TABLETS THREE TIMES DAILY. TAKE AN EXTRA 2 TABLETS ON DAYS YOU TAKE METOLAZONE, Disp: 572 tablet, Rfl: 0   probenecid (BENEMID) 500 MG tablet, Take 500 mg by mouth 2 (two) times daily., Disp: , Rfl:    simvastatin (ZOCOR) 10 MG tablet, Take 10 mg by mouth every evening., Disp: , Rfl:    spironolactone (ALDACTONE) 25 MG tablet, Take 1 tablet (25 mg total) by mouth every evening. Needs appt for further refills, Disp: 90 tablet, Rfl: 0   torsemide (DEMADEX) 20 MG tablet, Take 3 tablets (60 mg total) by mouth 2 (two) times daily. Needs appt for further refills, Disp: 540 tablet, Rfl: 0   traMADol (ULTRAM) 50 MG tablet, 1 tablet as needed (Patient not taking: No sig reported), Disp: , Rfl:  Allergies  Allergen Reactions   Ceftin Anaphylaxis    Face and throat swell    Geodon [Ziprasidone Hcl] Hives   Lisinopril Other (See Comments)    angioedema   Shellfish Allergy Other  (See Comments)    Gout exacerbation   Shellfish-Derived Products     Other reaction(s): Other   Ultram [Tramadol] Itching   Allopurinol Nausea Only and Other (See Comments)    weakness   Ativan [Lorazepam] Itching   Sulfa Antibiotics Itching   Ultram [Tramadol Hcl] Itching   Valium [Diazepam] Other (See Comments)    Patient states that diazepam doesn't relax, it has the opposite effect.     Social History   Socioeconomic History   Marital status: Married    Spouse name: Not on file   Number of children: 2   Years of education: 18   Highest education level: Master's degree (e.g., MA, MS, MEng, MEd, MSW, MBA)  Occupational History   Occupation: retired  Tobacco Use  Smoking status: Some Days    Packs/day: 0.10    Years: 26.00    Pack years: 2.60    Types: Cigarettes    Last attempt to quit: 05/06/2007    Years since quitting: 13.7   Smokeless tobacco: Never  Vaping Use   Vaping Use: Never used  Substance and Sexual Activity   Alcohol use: No   Drug use: No   Sexual activity: Yes  Other Topics Concern   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  Constitutional:      Appearance: Normal appearance. She is normal weight.  HENT:     Head: Normocephalic.     Nose: Nose normal.     Mouth/Throat:     Mouth: Mucous membranes are moist.     Pharynx: Oropharynx is  clear.  Eyes:     Pupils: Pupils are equal, round, and reactive to light.  Cardiovascular:     Rate and Rhythm: Normal rate and regular rhythm.     Pulses: Normal pulses.     Heart sounds: Normal heart sounds.  Pulmonary:     Effort: Pulmonary effort is normal.     Breath sounds: Normal breath sounds.  Abdominal:     General: Abdomen is flat.     Palpations: Abdomen is soft.  Musculoskeletal:        General: Swelling present. Normal range of motion.     Cervical back: Normal range of motion.     Right lower leg: Edema present.     Left lower leg: Edema present.  Skin:    General: Skin is warm and dry.     Capillary Refill: Capillary refill takes less than 2 seconds.  Neurological:     General: No focal deficit present.     Mental Status: She is alert. Mental status is at baseline.  Psychiatric:        Mood and Affect: Mood normal.    Arrived for home visit with [name] where she was seated on her couch with her left ankle in an ankle brace and elevated. Jazmarie reports she twisted her ankle on a tree root outside in her yard. No fall but some swelling noted to the left ankle with some pain on ROM.   Maridell reports otherwise feeling okay just confusion and depression. Katie came out last week to fill pill box. Luetta missed a few doses and reports she took more than one row at a time. We discussed at length and I labeled her pill box with times of when to take each row. She was appreciative and understood same.   Vitals and assessment as noted. Lacole lost 6lbs since our last visit she says she has limited her fluids and is trying to eat better.  Prep program was denied due to class being full I will follow up for next available class for her.   We reviewed dietary habits and medication regimen. Ottey husband is out of town this week but she has a home aide who comes daily. Home visit complete. I will see Nyjia in one week.   Refills:  Midodrine     Future  Appointments  Date Time Provider [place]  02/27/2021  9:50 AM Narda Amber K, DO [facility] None  04/10/2021  7:40 AM [facility] DEVICE REMOTES [facility]  07/10/2021  7:40 AM [facility] DEVICE REMOTES [facility]  08/11/2021  7:40 AM [facility] DEVICE REMOTES [facility]  09/10/2021  7:40 AM [facility] DEVICE REMOTES [facility]     ACTION: Home visit completed

## 2021-01-28 ENCOUNTER — Other Ambulatory Visit (HOSPITAL_COMMUNITY): Payer: Self-pay

## 2021-01-28 NOTE — Progress Notes (Signed)
Paramedicine Encounter    Patient ID: Rhonda [name], female    DOB: 10-02-1950, 70 y.o.   MRN: [id number]   Patient Care Team: Maurice Small, MD as PCP - General (Family Medicine) [name], LCSW as Social Worker (Licensed Clinical Social Worker) Alda Berthold, DO as Consulting Physician (Neurology)  Patient Active Problem List   Diagnosis Date Noted   Abnormal gait 06/11/2020   Adult failure to thrive syndrome 06/11/2020   Allergic rhinitis 06/11/2020   Anxiety 06/11/2020   Asthma 06/11/2020   Benign intracranial hypertension 06/11/2020   Body mass index (BMI) 45.0-49.9, adult ([place]) 06/11/2020   Bowel incontinence 06/11/2020   Cholelithiasis 06/11/2020   Chronic sinusitis 06/11/2020   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   Daytime somnolence 06/11/2020   Diarrhea of presumed infectious origin 06/11/2020   Edema 06/11/2020   Family history of malignant neoplasm of gastrointestinal tract 06/11/2020   Gout 06/11/2020   History of fall 06/11/2020   History of infectious disease 06/11/2020   Insomnia 06/11/2020   Irregular bowel habits 06/11/2020   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   Lumbar spondylosis with myelopathy 06/11/2020   Macrocytosis 06/11/2020   Mild recurrent major depression ([place]) 06/11/2020   Personal history of colonic polyps 06/11/2020   Personal history of malignant neoplasm of breast 06/11/2020   Recurrent falls 06/11/2020   Mixed anxiety and depressive disorder 06/11/2020   Irritable bowel syndrome 01/04/2020   Spinal stenosis of lumbar region 01/03/2020   Other symptoms and signs involving the musculoskeletal system 09/11/2019   Bilateral leg weakness 08/01/2019   Leg swelling 08/01/2019   Diabetes mellitus with neuropathy ([name]) 06/15/2019   Hypokalemia 11/08/2018   CKD (chronic kidney disease) stage 3, GFR 30-59 ml/min (HCC) 11/08/2018   Orthostatic hypotension 07/28/2017   SVD (spontaneous vaginal delivery)    Peripheral neuropathy    On home oxygen therapy     Migraines    Left bundle branch block    Hypothyroidism    Hypertension    Hyperlipidemia    Heart murmur    GERD (gastroesophageal reflux disease)    Exertional shortness of breath    Major depressive disorder    Back pain    Arthritis    Generalized anxiety disorder    Anemia    Chronic respiratory failure ([name]) 09/14/2013   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   Morbid obesity ([name]) 54/27/0623   Chronic systolic heart failure ([name]) 10/27/2012   Endometrial polyp 01/20/2012   Malignant tumor of breast ([name]) 03/26/2011   Unspecified vitamin D deficiency 03/26/2011    Current Outpatient Medications:    acetaZOLAMIDE (DIAMOX) 125 MG tablet, TAKE 1 TABLET(125 MG) BY MOUTH DAILY, Disp: 60 tablet, Rfl: 0   amoxicillin-clavulanate (AUGMENTIN) 875-125 MG tablet, , Disp: , Rfl:    carvedilol (COREG) 3.125 MG tablet, TAKE 1 TABLET (3.125 MG) BY MOUTH TWICE DAILY WITH MEALS, Disp: 180 tablet, Rfl: 3   citalopram (CELEXA) 20 MG tablet, Take 20 mg by mouth daily., Disp: , Rfl:    clonazePAM (KLONOPIN) 0.5 MG tablet, , Disp: , Rfl:    dicyclomine (BENTYL) 20 MG tablet, 1 tablet, Disp: , Rfl:    DULoxetine (CYMBALTA) 30 MG capsule, , Disp: , Rfl:    DULoxetine (CYMBALTA) 60 MG capsule, , Disp: , Rfl:    fexofenadine (ALLEGRA) 180 MG tablet, Take 180 mg by mouth 2 (two) times daily. (Patient not taking: No sig reported), Disp: , Rfl:    fludrocortisone (FLORINEF) 0.1 MG tablet, TAKE  1 TABLET TWICE DAILY, Disp: 180 tablet, Rfl: 0   fluticasone (FLONASE) 50 MCG/ACT nasal spray, , Disp: , Rfl:    gabapentin (NEURONTIN) 300 MG capsule, Take 1 capsule by mouth 3 (three) times daily., Disp: , Rfl:    gabapentin (NEURONTIN) 800 MG tablet, Take 800 mg by mouth at bedtime., Disp: , Rfl:    Ginkgo Biloba 120 MG CAPS, See admin instructions., Disp: , Rfl:    HYDROcodone-acetaminophen (NORCO) 10-325 MG tablet, 1 tablet as needed, Disp: , Rfl:    hydrOXYzine  (ATARAX/VISTARIL) 25 MG tablet, 1 tablet as needed (Patient not taking: No sig reported), Disp: , Rfl:    hydrOXYzine (VISTARIL) 25 MG capsule, 1 capsule as needed (Patient not taking: No sig reported), Disp: , Rfl:    ipratropium (ATROVENT) 0.03 % nasal spray, Place 2 sprays into both nostrils 2 (two) times daily., Disp: , Rfl:    levothyroxine (SYNTHROID) 88 MCG tablet, TAKE 1 TABLET(88 MCG) BY MOUTH DAILY BEFORE BREAKFAST, Disp: 90 tablet, Rfl: 3   lidocaine (XYLOCAINE) 2 % jelly, , Disp: , Rfl:    metolazone (ZAROXOLYN) 2.5 MG tablet, TAKE 1 TABLET BY MOUTH EVERY TUESDAY, Disp: 12 tablet, Rfl: 3   midodrine (PROAMATINE) 10 MG tablet, TAKE 1 TABLET (10 MG TOTAL) BY MOUTH 3 (THREE) TIMES DAILY WITH MEALS., Disp: 270 tablet, Rfl: 3   mirtazapine (REMERON) 30 MG tablet, 1 tablet at bedtime., Disp: , Rfl:    montelukast (SINGULAIR) 10 MG tablet, Take 10 mg by mouth at bedtime. , Disp: , Rfl: 1   omeprazole (PRILOSEC) 40 MG capsule, Take 40 mg by mouth daily., Disp: , Rfl:    potassium chloride SA (KLOR-CON) 20 MEQ tablet, TAKE 2 TABLETS THREE TIMES DAILY. TAKE AN EXTRA 2 TABLETS ON DAYS YOU TAKE METOLAZONE, Disp: 572 tablet, Rfl: 0   probenecid (BENEMID) 500 MG tablet, Take 500 mg by mouth 2 (two) times daily., Disp: , Rfl:    simvastatin (ZOCOR) 10 MG tablet, Take 10 mg by mouth every evening., Disp: , Rfl:    spironolactone (ALDACTONE) 25 MG tablet, Take 1 tablet (25 mg total) by mouth every evening. Needs appt for further refills, Disp: 90 tablet, Rfl: 0   torsemide (DEMADEX) 20 MG tablet, Take 3 tablets (60 mg total) by mouth 2 (two) times daily. Needs appt for further refills, Disp: 540 tablet, Rfl: 0   traMADol (ULTRAM) 50 MG tablet, 1 tablet as needed (Patient not taking: No sig reported), Disp: , Rfl:  Allergies  Allergen Reactions   Ceftin Anaphylaxis    Face and throat swell    Geodon [Ziprasidone Hcl] Hives   Lisinopril Other (See Comments)    angioedema   Shellfish Allergy Other  (See Comments)    Gout exacerbation   Shellfish-Derived Products     Other reaction(s): Other   Ultram [Tramadol] Itching   Allopurinol Nausea Only and Other (See Comments)    weakness   Ativan [Lorazepam] Itching   Sulfa Antibiotics Itching   Ultram [Tramadol Hcl] Itching   Valium [Diazepam] Other (See Comments)    Patient states that diazepam doesn't relax, it has the opposite effect.     Social History   Socioeconomic History   Marital status: Married    Spouse name: Not on file   Number of children: 2   Years of education: 18   Highest education level: Master's degree (e.g., MA, MS, MEng, MEd, MSW, MBA)  Occupational History   Occupation: retired  Tobacco Use  Smoking status: Some Days    Packs/day: 0.10    Years: 26.00    Pack years: 2.60    Types: Cigarettes    Last attempt to quit: 05/06/2007    Years since quitting: 13.7   Smokeless tobacco: Never  Vaping Use   Vaping Use: Never used  Substance and Sexual Activity   Alcohol use: No   Drug use: No   Sexual activity: Yes  Other Topics Concern   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  Constitutional:      Appearance: Normal appearance. She is normal weight.  HENT:     Head: Normocephalic.     Nose: Nose normal.     Mouth/Throat:     Mouth: Mucous membranes are moist.     Pharynx: Oropharynx is  clear.  Eyes:     Conjunctiva/sclera: Conjunctivae normal.     Pupils: Pupils are equal, round, and reactive to light.  Cardiovascular:     Rate and Rhythm: Normal rate and regular rhythm.     Pulses: Normal pulses.     Heart sounds: Normal heart sounds.  Pulmonary:     Effort: Pulmonary effort is normal.     Breath sounds: Normal breath sounds.  Abdominal:     General: Abdomen is flat.     Palpations: Abdomen is soft.  Musculoskeletal:        General: No swelling. Normal range of motion.     Cervical back: Normal range of motion.     Right lower leg: No edema.     Left lower leg: No edema.  Skin:    General: Skin is warm and dry.     Capillary Refill: Capillary refill takes less than 2 seconds.  Neurological:     General: No focal deficit present.     Mental Status: She is alert. Mental status is at baseline.  Psychiatric:        Mood and Affect: Mood normal.    Arrived for home visit for Charonda who reports feeling good today. Fizza was complaint with her medications over the last week and her swelling in her legs have gone down. No edema noted in either lower leg. Telia denied chest pain, dizziness, shortness of breath. Shailene has not been seen in clinic in a while, I requested APP visit just to follow up.   Weight 224 today, up 2lbs from last week.   I obtained vitals and assessment as noted.   We reviewed diet and med compliance.   Annmargaret had no complaints and agreed with home visit in one week.   Refills: NONE     Future Appointments  Date Time Provider [place]  02/24/2021  7:30 AM [facility] DEVICE REMOTES [facility]  02/27/2021  9:50 AM Narda Amber K, DO [facility] None  04/10/2021  7:40 AM [facility] DEVICE REMOTES [facility]  07/10/2021  7:40 AM [facility] DEVICE REMOTES [facility]  08/11/2021  7:40 AM [facility] DEVICE REMOTES [facility]  09/10/2021  7:40 AM [facility] DEVICE REMOTES  [facility]     ACTION: Home visit completed

## 2021-01-29 NOTE — Progress Notes (Signed)
Remote ICD transmission.   

## 2021-02-04 ENCOUNTER — Other Ambulatory Visit (HOSPITAL_COMMUNITY): Payer: Self-pay

## 2021-02-04 ENCOUNTER — Telehealth: Payer: Self-pay

## 2021-02-04 NOTE — Telephone Encounter (Signed)
Spoke with patient and scheduled an in-person Palliative Consult for 03/04/21 @ 12 PM with Dr. Hollace Kinnier. Documentation will be noted in [place].   COVID screening was negative. Small dig in the home will put away. Patient lives with husband.  Consent obtained; updated Outlook/Netsmart/Team List and Epic.   Patient is aware she may be receiving a call from provider the day before or day of to confirm appointment.

## 2021-02-04 NOTE — Progress Notes (Signed)
Paramedicine Encounter    Patient ID: Rhonda [name], female    DOB: 10-02-1950, 70 y.o.   MRN: [id number]   Patient Care Team: Maurice Small, MD as PCP - General (Family Medicine) [name], LCSW as Social Worker (Licensed Clinical Social Worker) Alda Berthold, DO as Consulting Physician (Neurology)  Patient Active Problem List   Diagnosis Date Noted   Abnormal gait 06/11/2020   Adult failure to thrive syndrome 06/11/2020   Allergic rhinitis 06/11/2020   Anxiety 06/11/2020   Asthma 06/11/2020   Benign intracranial hypertension 06/11/2020   Body mass index (BMI) 45.0-49.9, adult ([place]) 06/11/2020   Bowel incontinence 06/11/2020   Cholelithiasis 06/11/2020   Chronic sinusitis 06/11/2020   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   Daytime somnolence 06/11/2020   Diarrhea of presumed infectious origin 06/11/2020   Edema 06/11/2020   Family history of malignant neoplasm of gastrointestinal tract 06/11/2020   Gout 06/11/2020   History of fall 06/11/2020   History of infectious disease 06/11/2020   Insomnia 06/11/2020   Irregular bowel habits 06/11/2020   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   Lumbar spondylosis with myelopathy 06/11/2020   Macrocytosis 06/11/2020   Mild recurrent major depression ([place]) 06/11/2020   Personal history of colonic polyps 06/11/2020   Personal history of malignant neoplasm of breast 06/11/2020   Recurrent falls 06/11/2020   Mixed anxiety and depressive disorder 06/11/2020   Irritable bowel syndrome 01/04/2020   Spinal stenosis of lumbar region 01/03/2020   Other symptoms and signs involving the musculoskeletal system 09/11/2019   Bilateral leg weakness 08/01/2019   Leg swelling 08/01/2019   Diabetes mellitus with neuropathy ([name]) 06/15/2019   Hypokalemia 11/08/2018   CKD (chronic kidney disease) stage 3, GFR 30-59 ml/min (HCC) 11/08/2018   Orthostatic hypotension 07/28/2017   SVD (spontaneous vaginal delivery)    Peripheral neuropathy    On home oxygen therapy     Migraines    Left bundle branch block    Hypothyroidism    Hypertension    Hyperlipidemia    Heart murmur    GERD (gastroesophageal reflux disease)    Exertional shortness of breath    Major depressive disorder    Back pain    Arthritis    Generalized anxiety disorder    Anemia    Chronic respiratory failure ([name]) 09/14/2013   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   Morbid obesity ([name]) 54/27/0623   Chronic systolic heart failure ([name]) 10/27/2012   Endometrial polyp 01/20/2012   Malignant tumor of breast ([name]) 03/26/2011   Unspecified vitamin D deficiency 03/26/2011    Current Outpatient Medications:    acetaZOLAMIDE (DIAMOX) 125 MG tablet, TAKE 1 TABLET(125 MG) BY MOUTH DAILY, Disp: 60 tablet, Rfl: 0   amoxicillin-clavulanate (AUGMENTIN) 875-125 MG tablet, , Disp: , Rfl:    carvedilol (COREG) 3.125 MG tablet, TAKE 1 TABLET (3.125 MG) BY MOUTH TWICE DAILY WITH MEALS, Disp: 180 tablet, Rfl: 3   citalopram (CELEXA) 20 MG tablet, Take 20 mg by mouth daily., Disp: , Rfl:    clonazePAM (KLONOPIN) 0.5 MG tablet, , Disp: , Rfl:    dicyclomine (BENTYL) 20 MG tablet, 1 tablet, Disp: , Rfl:    DULoxetine (CYMBALTA) 30 MG capsule, , Disp: , Rfl:    DULoxetine (CYMBALTA) 60 MG capsule, , Disp: , Rfl:    fexofenadine (ALLEGRA) 180 MG tablet, Take 180 mg by mouth 2 (two) times daily. (Patient not taking: No sig reported), Disp: , Rfl:    fludrocortisone (FLORINEF) 0.1 MG tablet, TAKE  1 TABLET TWICE DAILY, Disp: 180 tablet, Rfl: 0   fluticasone (FLONASE) 50 MCG/ACT nasal spray, , Disp: , Rfl:    gabapentin (NEURONTIN) 300 MG capsule, Take 1 capsule by mouth 3 (three) times daily., Disp: , Rfl:    gabapentin (NEURONTIN) 800 MG tablet, Take 800 mg by mouth at bedtime., Disp: , Rfl:    Ginkgo Biloba 120 MG CAPS, See admin instructions., Disp: , Rfl:    HYDROcodone-acetaminophen (NORCO) 10-325 MG tablet, 1 tablet as needed, Disp: , Rfl:    hydrOXYzine  (ATARAX/VISTARIL) 25 MG tablet, 1 tablet as needed (Patient not taking: No sig reported), Disp: , Rfl:    hydrOXYzine (VISTARIL) 25 MG capsule, 1 capsule as needed (Patient not taking: No sig reported), Disp: , Rfl:    ipratropium (ATROVENT) 0.03 % nasal spray, Place 2 sprays into both nostrils 2 (two) times daily., Disp: , Rfl:    levothyroxine (SYNTHROID) 88 MCG tablet, TAKE 1 TABLET(88 MCG) BY MOUTH DAILY BEFORE BREAKFAST, Disp: 90 tablet, Rfl: 3   lidocaine (XYLOCAINE) 2 % jelly, , Disp: , Rfl:    metolazone (ZAROXOLYN) 2.5 MG tablet, TAKE 1 TABLET BY MOUTH EVERY TUESDAY, Disp: 12 tablet, Rfl: 3   midodrine (PROAMATINE) 10 MG tablet, TAKE 1 TABLET (10 MG TOTAL) BY MOUTH 3 (THREE) TIMES DAILY WITH MEALS., Disp: 270 tablet, Rfl: 3   mirtazapine (REMERON) 30 MG tablet, 1 tablet at bedtime., Disp: , Rfl:    montelukast (SINGULAIR) 10 MG tablet, Take 10 mg by mouth at bedtime. , Disp: , Rfl: 1   omeprazole (PRILOSEC) 40 MG capsule, Take 40 mg by mouth daily., Disp: , Rfl:    potassium chloride SA (KLOR-CON) 20 MEQ tablet, TAKE 2 TABLETS THREE TIMES DAILY. TAKE AN EXTRA 2 TABLETS ON DAYS YOU TAKE METOLAZONE, Disp: 572 tablet, Rfl: 0   probenecid (BENEMID) 500 MG tablet, Take 500 mg by mouth 2 (two) times daily., Disp: , Rfl:    simvastatin (ZOCOR) 10 MG tablet, Take 10 mg by mouth every evening., Disp: , Rfl:    spironolactone (ALDACTONE) 25 MG tablet, Take 1 tablet (25 mg total) by mouth every evening. Needs appt for further refills, Disp: 90 tablet, Rfl: 0   torsemide (DEMADEX) 20 MG tablet, Take 3 tablets (60 mg total) by mouth 2 (two) times daily. Needs appt for further refills, Disp: 540 tablet, Rfl: 0   traMADol (ULTRAM) 50 MG tablet, 1 tablet as needed (Patient not taking: No sig reported), Disp: , Rfl:  Allergies  Allergen Reactions   Ceftin Anaphylaxis    Face and throat swell    Geodon [Ziprasidone Hcl] Hives   Lisinopril Other (See Comments)    angioedema   Shellfish Allergy Other  (See Comments)    Gout exacerbation   Shellfish-Derived Products     Other reaction(s): Other   Ultram [Tramadol] Itching   Allopurinol Nausea Only and Other (See Comments)    weakness   Ativan [Lorazepam] Itching   Sulfa Antibiotics Itching   Ultram [Tramadol Hcl] Itching   Valium [Diazepam] Other (See Comments)    Patient states that diazepam doesn't relax, it has the opposite effect.     Social History   Socioeconomic History   Marital status: Married    Spouse name: Not on file   Number of children: 2   Years of education: 18   Highest education level: Master's degree (e.g., MA, MS, MEng, MEd, MSW, MBA)  Occupational History   Occupation: retired  Tobacco Use  Smoking status: Some Days    Packs/day: 0.10    Years: 26.00    Pack years: 2.60    Types: Cigarettes    Last attempt to quit: 05/06/2007    Years since quitting: 13.7   Smokeless tobacco: Never  Vaping Use   Vaping Use: Never used  Substance and Sexual Activity   Alcohol use: No   Drug use: No   Sexual activity: Yes  Other Topics Concern   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  Constitutional:      Appearance: Normal appearance. She is normal weight.  HENT:     Head: Normocephalic.     Nose: Nose normal.     Mouth/Throat:     Mouth: Mucous membranes are moist.     Pharynx: Oropharynx is  clear.  Eyes:     Conjunctiva/sclera: Conjunctivae normal.     Pupils: Pupils are equal, round, and reactive to light.  Cardiovascular:     Rate and Rhythm: Normal rate and regular rhythm.     Pulses: Normal pulses.     Heart sounds: Normal heart sounds.  Pulmonary:     Effort: Pulmonary effort is normal.     Breath sounds: Normal breath sounds.  Abdominal:     General: Abdomen is flat.     Palpations: Abdomen is soft.  Musculoskeletal:        General: Swelling present. Normal range of motion.     Cervical back: Normal range of motion.     Right lower leg: Edema present.     Left lower leg: Edema present.  Skin:    General: Skin is warm and dry.     Capillary Refill: Capillary refill takes less than 2 seconds.  Neurological:     General: No focal deficit present.     Mental Status: She is alert. Mental status is at baseline.  Psychiatric:        Mood and Affect: Mood normal.    Arrived for home visit for Mrs. Schimpf who was alert and oriented ambulating around in her home. Melma reports she has been feeling okay, denying shortness of breath, dizziness or chest pain. Baird Lyons was complaint with her medications except two evening doses. I talked with Deniqua about the importance of not missing any doses and she verbalized understanding.   Assessment and vitals obtained. Some BLE noted but not severe. Panda weighed today and gained 2 lbs since last weeks visit. She reports she has been eating and drinking more than she is supposed to, we discussed diet management. She verbalized understanding.   Meds reviewed and pill box filled accordingly.   Appointments reviewed and confirmed.   Home visit planned for one week.   Refills: Gabapentin '300mg'$  Gabapentin '800mg'$  Singulair  Future Appointments  Date Time Provider [place]  02/18/2021 11:00 AM [facility] PA/NP [facility] None  02/24/2021  7:30 AM [facility] DEVICE REMOTES [facility]  02/27/2021  9:50 AM  Narda Amber K, DO [facility] None  04/10/2021  7:40 AM [facility] DEVICE REMOTES [facility]  07/10/2021  7:40 AM [facility] DEVICE REMOTES [facility]  08/11/2021  7:40 AM [facility] DEVICE REMOTES [facility]  09/10/2021  7:40 AM [facility] DEVICE REMOTES [facility]     ACTION: Home visit completed

## 2021-02-05 ENCOUNTER — Encounter (HOSPITAL_COMMUNITY): Payer: Self-pay | Admitting: Adult Health

## 2021-02-05 NOTE — Progress Notes (Unsigned)
HF Paramedicine Team Based Care Meeting  HF MD- NA  HF NP - [place] NP-C   [facility] admit within the last 30 days for heart failure? no  Medications concerns? Non compliant due to comprehension  Transportation issues ? no  Education needs? Continues to struggle with understanding her medication and how to take  SDOH - no  Eligible for discharge? Not at this time due to pt inability to manage own medications

## 2021-02-06 ENCOUNTER — Telehealth: Payer: Self-pay

## 2021-02-06 ENCOUNTER — Other Ambulatory Visit: Payer: Self-pay | Admitting: Neurology

## 2021-02-06 ENCOUNTER — Telehealth (HOSPITAL_COMMUNITY): Payer: Self-pay

## 2021-02-06 NOTE — Telephone Encounter (Signed)
Call to pt reference next PREP class starting on Oct 3rd M/W 1pm-215pm at [place] do time and days Intake scheduled for 02/20/21 at 2pm at [facility] for initial assessment and goal setting.

## 2021-02-06 NOTE — Telephone Encounter (Signed)
Called in refills for Rhonda [name]-  -gabapentin '300mg'$  -gabapentin '800mg'$  -singulair

## 2021-02-11 ENCOUNTER — Other Ambulatory Visit (HOSPITAL_COMMUNITY): Payer: Self-pay

## 2021-02-11 DIAGNOSIS — F0390 Unspecified dementia without behavioral disturbance: Secondary | ICD-10-CM | POA: Diagnosis not present

## 2021-02-11 DIAGNOSIS — R54 Age-related physical debility: Secondary | ICD-10-CM | POA: Diagnosis not present

## 2021-02-11 DIAGNOSIS — M48061 Spinal stenosis, lumbar region without neurogenic claudication: Secondary | ICD-10-CM | POA: Diagnosis not present

## 2021-02-11 NOTE — Progress Notes (Signed)
Paramedicine Encounter    Patient ID: Rhonda [name], female    DOB: 10-02-1950, 70 y.o.   MRN: [id number]   Patient Care Team: Maurice Small, MD as PCP - General (Family Medicine) [name], LCSW as Social Worker (Licensed Clinical Social Worker) Alda Berthold, DO as Consulting Physician (Neurology)  Patient Active Problem List   Diagnosis Date Noted   Abnormal gait 06/11/2020   Adult failure to thrive syndrome 06/11/2020   Allergic rhinitis 06/11/2020   Anxiety 06/11/2020   Asthma 06/11/2020   Benign intracranial hypertension 06/11/2020   Body mass index (BMI) 45.0-49.9, adult ([place]) 06/11/2020   Bowel incontinence 06/11/2020   Cholelithiasis 06/11/2020   Chronic sinusitis 06/11/2020   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   Daytime somnolence 06/11/2020   Diarrhea of presumed infectious origin 06/11/2020   Edema 06/11/2020   Family history of malignant neoplasm of gastrointestinal tract 06/11/2020   Gout 06/11/2020   History of fall 06/11/2020   History of infectious disease 06/11/2020   Insomnia 06/11/2020   Irregular bowel habits 06/11/2020   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   Lumbar spondylosis with myelopathy 06/11/2020   Macrocytosis 06/11/2020   Mild recurrent major depression ([place]) 06/11/2020   Personal history of colonic polyps 06/11/2020   Personal history of malignant neoplasm of breast 06/11/2020   Recurrent falls 06/11/2020   Mixed anxiety and depressive disorder 06/11/2020   Irritable bowel syndrome 01/04/2020   Spinal stenosis of lumbar region 01/03/2020   Other symptoms and signs involving the musculoskeletal system 09/11/2019   Bilateral leg weakness 08/01/2019   Leg swelling 08/01/2019   Diabetes mellitus with neuropathy ([name]) 06/15/2019   Hypokalemia 11/08/2018   CKD (chronic kidney disease) stage 3, GFR 30-59 ml/min (HCC) 11/08/2018   Orthostatic hypotension 07/28/2017   SVD (spontaneous vaginal delivery)    Peripheral neuropathy    On home oxygen therapy     Migraines    Left bundle branch block    Hypothyroidism    Hypertension    Hyperlipidemia    Heart murmur    GERD (gastroesophageal reflux disease)    Exertional shortness of breath    Major depressive disorder    Back pain    Arthritis    Generalized anxiety disorder    Anemia    Chronic respiratory failure ([name]) 09/14/2013   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   Morbid obesity ([name]) 54/27/0623   Chronic systolic heart failure ([name]) 10/27/2012   Endometrial polyp 01/20/2012   Malignant tumor of breast ([name]) 03/26/2011   Unspecified vitamin D deficiency 03/26/2011    Current Outpatient Medications:    acetaZOLAMIDE (DIAMOX) 125 MG tablet, TAKE 1 TABLET(125 MG) BY MOUTH DAILY, Disp: 60 tablet, Rfl: 0   amoxicillin-clavulanate (AUGMENTIN) 875-125 MG tablet, , Disp: , Rfl:    carvedilol (COREG) 3.125 MG tablet, TAKE 1 TABLET (3.125 MG) BY MOUTH TWICE DAILY WITH MEALS, Disp: 180 tablet, Rfl: 3   citalopram (CELEXA) 20 MG tablet, Take 20 mg by mouth daily., Disp: , Rfl:    clonazePAM (KLONOPIN) 0.5 MG tablet, , Disp: , Rfl:    dicyclomine (BENTYL) 20 MG tablet, 1 tablet, Disp: , Rfl:    DULoxetine (CYMBALTA) 30 MG capsule, , Disp: , Rfl:    DULoxetine (CYMBALTA) 60 MG capsule, , Disp: , Rfl:    fexofenadine (ALLEGRA) 180 MG tablet, Take 180 mg by mouth 2 (two) times daily. (Patient not taking: No sig reported), Disp: , Rfl:    fludrocortisone (FLORINEF) 0.1 MG tablet, TAKE  1 TABLET TWICE DAILY, Disp: 180 tablet, Rfl: 0   fluticasone (FLONASE) 50 MCG/ACT nasal spray, , Disp: , Rfl:    gabapentin (NEURONTIN) 300 MG capsule, Take 1 capsule by mouth 3 (three) times daily., Disp: , Rfl:    gabapentin (NEURONTIN) 800 MG tablet, TAKE 1 TABLET AT BEDTIME AS NEEDED FOR NEUROPATHY, Disp: 30 tablet, Rfl: 0   Ginkgo Biloba 120 MG CAPS, See admin instructions., Disp: , Rfl:    HYDROcodone-acetaminophen (NORCO) 10-325 MG tablet, 1 tablet as needed, Disp: ,  Rfl:    hydrOXYzine (ATARAX/VISTARIL) 25 MG tablet, 1 tablet as needed (Patient not taking: No sig reported), Disp: , Rfl:    hydrOXYzine (VISTARIL) 25 MG capsule, 1 capsule as needed (Patient not taking: No sig reported), Disp: , Rfl:    ipratropium (ATROVENT) 0.03 % nasal spray, Place 2 sprays into both nostrils 2 (two) times daily., Disp: , Rfl:    levothyroxine (SYNTHROID) 88 MCG tablet, TAKE 1 TABLET(88 MCG) BY MOUTH DAILY BEFORE BREAKFAST, Disp: 90 tablet, Rfl: 3   lidocaine (XYLOCAINE) 2 % jelly, , Disp: , Rfl:    metolazone (ZAROXOLYN) 2.5 MG tablet, TAKE 1 TABLET BY MOUTH EVERY TUESDAY, Disp: 12 tablet, Rfl: 3   midodrine (PROAMATINE) 10 MG tablet, TAKE 1 TABLET (10 MG TOTAL) BY MOUTH 3 (THREE) TIMES DAILY WITH MEALS., Disp: 270 tablet, Rfl: 3   mirtazapine (REMERON) 30 MG tablet, 1 tablet at bedtime., Disp: , Rfl:    montelukast (SINGULAIR) 10 MG tablet, Take 10 mg by mouth at bedtime. , Disp: , Rfl: 1   omeprazole (PRILOSEC) 40 MG capsule, Take 40 mg by mouth daily., Disp: , Rfl:    potassium chloride SA (KLOR-CON) 20 MEQ tablet, TAKE 2 TABLETS THREE TIMES DAILY. TAKE AN EXTRA 2 TABLETS ON DAYS YOU TAKE METOLAZONE, Disp: 572 tablet, Rfl: 0   probenecid (BENEMID) 500 MG tablet, Take 500 mg by mouth 2 (two) times daily., Disp: , Rfl:    simvastatin (ZOCOR) 10 MG tablet, Take 10 mg by mouth every evening., Disp: , Rfl:    spironolactone (ALDACTONE) 25 MG tablet, Take 1 tablet (25 mg total) by mouth every evening. Needs appt for further refills, Disp: 90 tablet, Rfl: 0   torsemide (DEMADEX) 20 MG tablet, Take 3 tablets (60 mg total) by mouth 2 (two) times daily. Needs appt for further refills, Disp: 540 tablet, Rfl: 0   traMADol (ULTRAM) 50 MG tablet, 1 tablet as needed (Patient not taking: No sig reported), Disp: , Rfl:  Allergies  Allergen Reactions   Ceftin Anaphylaxis    Face and throat swell    Geodon [Ziprasidone Hcl] Hives   Lisinopril Other (See Comments)    angioedema    Shellfish Allergy Other (See Comments)    Gout exacerbation   Shellfish-Derived Products     Other reaction(s): Other   Ultram [Tramadol] Itching   Allopurinol Nausea Only and Other (See Comments)    weakness   Ativan [Lorazepam] Itching   Sulfa Antibiotics Itching   Ultram [Tramadol Hcl] Itching   Valium [Diazepam] Other (See Comments)    Patient states that diazepam doesn't relax, it has the opposite effect.     Social History   Socioeconomic History   Marital status: Married    Spouse name: Not on file   Number of children: 2   Years of education: 18   Highest education level: Master's degree (e.g., MA, MS, MEng, MEd, MSW, MBA)  Occupational History   Occupation: retired  Tobacco  Use   Smoking status: Some Days    Packs/day: 0.10    Years: 26.00    Pack years: 2.60    Types: Cigarettes    Last attempt to quit: 05/06/2007    Years since quitting: 13.7   Smokeless tobacco: Never  Vaping Use   Vaping Use: Never used  Substance and Sexual Activity   Alcohol use: No   Drug use: No   Sexual activity: Yes  Other Topics Concern   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  Constitutional:      Appearance: Normal appearance. She is normal weight.  HENT:     Head: Normocephalic.     Nose: Nose normal.     Mouth/Throat:     Mouth: Mucous membranes are moist.      Pharynx: Oropharynx is clear.  Eyes:     Pupils: Pupils are equal, round, and reactive to light.  Cardiovascular:     Rate and Rhythm: Normal rate and regular rhythm.     Pulses: Normal pulses.     Heart sounds: Normal heart sounds.  Pulmonary:     Effort: Pulmonary effort is normal.     Breath sounds: Normal breath sounds.  Abdominal:     General: Abdomen is flat.     Palpations: Abdomen is soft.  Musculoskeletal:        General: Swelling present. Normal range of motion.     Cervical back: Normal range of motion.     Right lower leg: Edema present.     Left lower leg: Edema present.  Skin:    General: Skin is warm and dry.     Capillary Refill: Capillary refill takes less than 2 seconds.  Neurological:     General: No focal deficit present.     Mental Status: She is alert. Mental status is at baseline.  Psychiatric:        Mood and Affect: Mood normal.    Arrived for home visit for [facility] where she was reporting to be feeling good this week. She denied any shortness of breath, dizziness, chest pain or trouble taking her medications. Assessment showed some swelling with edema in her lower legs but this is normal for Mrs. Mcintire. No JVD, lungs clear. Weight is the same this week. No gain or loss. Chauntae reports she has been doing better about her fluid intake.   Vitals as noted in report.  Medications reviewed and confirmed. Pill box filled accordingly.   We reviewed appointments with upcoming clinic visit next week at 11:00, I plan to meet her at. I instructed her to bring all meds and pill box. She agreed with plan. Home visit complete. I will see her in one week.   Refills: NONE  Future Appointments  Date Time Provider [place]  02/18/2021 11:00 AM [facility] PA/NP [facility] None  02/24/2021  7:30 AM [facility] DEVICE REMOTES [facility]  02/27/2021  9:50 AM Narda Amber K, DO [facility] None  04/10/2021  7:40 AM [facility] DEVICE REMOTES [facility]  07/10/2021  7:40 AM [facility] DEVICE REMOTES [facility]  08/11/2021  7:40 AM [facility] DEVICE REMOTES [facility]  09/10/2021  7:40 AM [facility] DEVICE REMOTES [facility]     ACTION: Home visit completed

## 2021-02-13 DIAGNOSIS — M549 Dorsalgia, unspecified: Secondary | ICD-10-CM | POA: Diagnosis not present

## 2021-02-13 DIAGNOSIS — Z23 Encounter for immunization: Secondary | ICD-10-CM | POA: Diagnosis not present

## 2021-02-13 DIAGNOSIS — R251 Tremor, unspecified: Secondary | ICD-10-CM | POA: Diagnosis not present

## 2021-02-13 DIAGNOSIS — R296 Repeated falls: Secondary | ICD-10-CM | POA: Diagnosis not present

## 2021-02-13 DIAGNOSIS — M4800 Spinal stenosis, site unspecified: Secondary | ICD-10-CM | POA: Diagnosis not present

## 2021-02-18 ENCOUNTER — Other Ambulatory Visit (HOSPITAL_COMMUNITY): Payer: Self-pay

## 2021-02-18 ENCOUNTER — Encounter (HOSPITAL_COMMUNITY): Payer: Medicare HMO

## 2021-02-18 NOTE — Progress Notes (Signed)
Paramedicine Encounter    Patient ID: Rhonda [name], female    DOB: [birth year], 70 y.o.   MRN: [id number]   Patient Care Team: Maurice Small, MD as PCP - General (Family Medicine) [name], LCSW as Social Worker (Licensed Clinical Social Worker) Alda Berthold, DO as Consulting Physician (Neurology)  Patient Active Problem List   Diagnosis Date Noted   Abnormal gait 06/11/2020   Adult failure to thrive syndrome 06/11/2020   Allergic rhinitis 06/11/2020   Anxiety 06/11/2020   Asthma 06/11/2020   Benign intracranial hypertension 06/11/2020   Body mass index (BMI) 45.0-49.9, adult ([place]) 06/11/2020   Bowel incontinence 06/11/2020   Cholelithiasis 06/11/2020   Chronic sinusitis 06/11/2020   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   Daytime somnolence 06/11/2020   Diarrhea of presumed infectious origin 06/11/2020   Edema 06/11/2020   Family history of malignant neoplasm of gastrointestinal tract 06/11/2020   Gout 06/11/2020   History of fall 06/11/2020   History of infectious disease 06/11/2020   Insomnia 06/11/2020   Irregular bowel habits 06/11/2020   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   Lumbar spondylosis with myelopathy 06/11/2020   Macrocytosis 06/11/2020   Mild recurrent major depression ([place]) 06/11/2020   Personal history of colonic polyps 06/11/2020   Personal history of malignant neoplasm of breast 06/11/2020   Recurrent falls 06/11/2020   Mixed anxiety and depressive disorder 06/11/2020   Irritable bowel syndrome 01/04/2020   Spinal stenosis of lumbar region 01/03/2020   Other symptoms and signs involving the musculoskeletal system 09/11/2019   Bilateral leg weakness 08/01/2019   Leg swelling 08/01/2019   Diabetes mellitus with neuropathy ([place]) 06/15/2019   Hypokalemia 11/08/2018   CKD (chronic kidney disease) stage 3, GFR 30-59 ml/min (HCC) 11/08/2018   Orthostatic hypotension 07/28/2017   SVD (spontaneous vaginal delivery)    Peripheral neuropathy    On home oxygen therapy     Migraines    Left bundle branch block    Hypothyroidism    Hypertension    Hyperlipidemia    Heart murmur    GERD (gastroesophageal reflux disease)    Exertional shortness of breath    Major depressive disorder    Back pain    Arthritis    Generalized anxiety disorder    Anemia    Chronic respiratory failure ([place]) 09/14/2013   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   Morbid obesity ([place]) 82/95/6213   Chronic systolic heart failure ([place]) 10/27/2012   Endometrial polyp 01/20/2012   Malignant tumor of breast ([place]) 03/26/2011   Unspecified vitamin D deficiency 03/26/2011    Current Outpatient Medications:    acetaZOLAMIDE (DIAMOX) 125 MG tablet, TAKE 1 TABLET(125 MG) BY MOUTH DAILY, Disp: 60 tablet, Rfl: 0   amoxicillin-clavulanate (AUGMENTIN) 875-125 MG tablet, , Disp: , Rfl:    carvedilol (COREG) 3.125 MG tablet, TAKE 1 TABLET (3.125 MG) BY MOUTH TWICE DAILY WITH MEALS, Disp: 180 tablet, Rfl: 3   citalopram (CELEXA) 20 MG tablet, Take 20 mg by mouth daily., Disp: , Rfl:    clonazePAM (KLONOPIN) 0.5 MG tablet, , Disp: , Rfl:    dicyclomine (BENTYL) 20 MG tablet, 1 tablet, Disp: , Rfl:    DULoxetine (CYMBALTA) 30 MG capsule, , Disp: , Rfl:    DULoxetine (CYMBALTA) 60 MG capsule, , Disp: , Rfl:    fexofenadine (ALLEGRA) 180 MG tablet, Take 180 mg by mouth 2 (two) times daily. (Patient not taking: No sig reported), Disp: , Rfl:    fludrocortisone (FLORINEF) 0.1 MG tablet, TAKE  1 TABLET TWICE DAILY, Disp: 180 tablet, Rfl: 0   fluticasone (FLONASE) 50 MCG/ACT nasal spray, , Disp: , Rfl:    gabapentin (NEURONTIN) 300 MG capsule, Take 1 capsule by mouth 3 (three) times daily., Disp: , Rfl:    gabapentin (NEURONTIN) 800 MG tablet, TAKE 1 TABLET AT BEDTIME AS NEEDED FOR NEUROPATHY, Disp: 30 tablet, Rfl: 0   Ginkgo Biloba 120 MG CAPS, See admin instructions., Disp: , Rfl:    HYDROcodone-acetaminophen (NORCO) 10-325 MG tablet, 1 tablet as needed, Disp: ,  Rfl:    hydrOXYzine (ATARAX/VISTARIL) 25 MG tablet, 1 tablet as needed (Patient not taking: No sig reported), Disp: , Rfl:    hydrOXYzine (VISTARIL) 25 MG capsule, 1 capsule as needed (Patient not taking: No sig reported), Disp: , Rfl:    ipratropium (ATROVENT) 0.03 % nasal spray, Place 2 sprays into both nostrils 2 (two) times daily. (Patient not taking: Reported on 02/11/2021), Disp: , Rfl:    levothyroxine (SYNTHROID) 88 MCG tablet, TAKE 1 TABLET(88 MCG) BY MOUTH DAILY BEFORE BREAKFAST, Disp: 90 tablet, Rfl: 3   lidocaine (XYLOCAINE) 2 % jelly, , Disp: , Rfl:    metolazone (ZAROXOLYN) 2.5 MG tablet, TAKE 1 TABLET BY MOUTH EVERY TUESDAY, Disp: 12 tablet, Rfl: 3   midodrine (PROAMATINE) 10 MG tablet, TAKE 1 TABLET (10 MG TOTAL) BY MOUTH 3 (THREE) TIMES DAILY WITH MEALS., Disp: 270 tablet, Rfl: 3   mirtazapine (REMERON) 30 MG tablet, 1 tablet at bedtime., Disp: , Rfl:    montelukast (SINGULAIR) 10 MG tablet, Take 10 mg by mouth at bedtime. , Disp: , Rfl: 1   omeprazole (PRILOSEC) 40 MG capsule, Take 40 mg by mouth daily., Disp: , Rfl:    potassium chloride SA (KLOR-CON) 20 MEQ tablet, TAKE 2 TABLETS THREE TIMES DAILY. TAKE AN EXTRA 2 TABLETS ON DAYS YOU TAKE METOLAZONE, Disp: 572 tablet, Rfl: 0   probenecid (BENEMID) 500 MG tablet, Take 500 mg by mouth 2 (two) times daily., Disp: , Rfl:    simvastatin (ZOCOR) 10 MG tablet, Take 10 mg by mouth every evening., Disp: , Rfl:    spironolactone (ALDACTONE) 25 MG tablet, Take 1 tablet (25 mg total) by mouth every evening. Needs appt for further refills, Disp: 90 tablet, Rfl: 0   torsemide (DEMADEX) 20 MG tablet, Take 3 tablets (60 mg total) by mouth 2 (two) times daily. Needs appt for further refills, Disp: 540 tablet, Rfl: 0   traMADol (ULTRAM) 50 MG tablet, 1 tablet as needed (Patient not taking: No sig reported), Disp: , Rfl:  Allergies  Allergen Reactions   Ceftin Anaphylaxis    Face and throat swell    Geodon [Ziprasidone Hcl] Hives   Lisinopril  Other (See Comments)    angioedema   Shellfish Allergy Other (See Comments)    Gout exacerbation   Shellfish-Derived Products     Other reaction(s): Other   Ultram [Tramadol] Itching   Allopurinol Nausea Only and Other (See Comments)    weakness   Ativan [Lorazepam] Itching   Sulfa Antibiotics Itching   Ultram [Tramadol Hcl] Itching   Valium [Diazepam] Other (See Comments)    Patient states that diazepam doesn't relax, it has the opposite effect.     Social History   Socioeconomic History   Marital status: Married    Spouse name: Not on file   Number of children: 2   Years of education: 18   Highest education level: Master's degree (e.g., MA, MS, MEng, MEd, MSW, MBA)  Occupational History  Occupation: retired  Tobacco Use   Smoking status: Some Days    Packs/day: 0.10    Years: 26.00    Pack years: 2.60    Types: Cigarettes    Last attempt to quit: 05/06/2007    Years since quitting: 13.8   Smokeless tobacco: Never  Vaping Use   Vaping Use: Never used  Substance and Sexual Activity   Alcohol use: No   Drug use: No   Sexual activity: Yes  Other Topics Concern   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  Constitutional:      Appearance: Normal appearance. She is normal weight.  HENT:     Head: Normocephalic.     Nose: Nose normal.     Mouth/Throat:      Mouth: Mucous membranes are moist.     Pharynx: Oropharynx is clear.  Eyes:     Conjunctiva/sclera: Conjunctivae normal.     Pupils: Pupils are equal, round, and reactive to light.  Cardiovascular:     Rate and Rhythm: Normal rate and regular rhythm.     Pulses: Normal pulses.     Heart sounds: Normal heart sounds.  Pulmonary:     Effort: Pulmonary effort is normal.     Breath sounds: Normal breath sounds.  Abdominal:     General: Abdomen is flat.     Palpations: Abdomen is soft.  Musculoskeletal:        General: Swelling present. Normal range of motion.     Cervical back: Normal range of motion.     Right lower leg: Edema present.     Left lower leg: Edema present.  Skin:    General: Skin is warm and dry.     Capillary Refill: Capillary refill takes less than 2 seconds.  Neurological:     General: No focal deficit present.     Mental Status: She is alert. Mental status is at baseline.  Psychiatric:        Mood and Affect: Mood normal.     Arrived for home visit for Rhonda [name] reporting to be feeling okay just stating she has had some increased shortness of breath this week. Rhonda [name]'s weight is up 4 lbs from last week, she reports she has not been drinking too much liquids or eating lots of salty snacks.  She did admit to drinking 1/2 to 1 can of Celsius energy drinks daily, and I advised her these were not healthy for her heart. She understood. We talked about this more and I reminded her of her limit of 2L in a day and less than 2,000mg  of salt daily. She verbalized understanding.  Vitals and assessment were obtained. Lung sounds clear, some slight edema but no pitting to lower legs.    Medications  reviewed and confirmed. Pill box filled accordingly. No refills needed.   Appointments reviewed and confirmed.   Home visit complete. I will see Rhonda [name] in one week.    Future Appointments  Date Time Provider [place]  02/24/2021  7:30 AM [facility] DEVICE REMOTES  [facility]  02/27/2021  9:50 AM Narda Amber K, DO [facility] None  03/19/2021  2:00 PM [facility] PA/NP [facility] None  04/10/2021  7:40 AM [facility] DEVICE REMOTES [facility]  07/10/2021  7:40 AM [facility] DEVICE REMOTES [facility]  08/11/2021  7:40 AM [facility] DEVICE REMOTES [facility]  09/10/2021  7:40 AM [facility] DEVICE REMOTES [facility]     ACTION: Home visit completed

## 2021-02-24 ENCOUNTER — Ambulatory Visit (INDEPENDENT_AMBULATORY_CARE_PROVIDER_SITE_OTHER): Payer: Medicare HMO

## 2021-02-24 DIAGNOSIS — Z95810 Presence of automatic (implantable) cardiac defibrillator: Secondary | ICD-10-CM

## 2021-02-24 DIAGNOSIS — I5022 Chronic systolic (congestive) heart failure: Secondary | ICD-10-CM

## 2021-02-25 ENCOUNTER — Other Ambulatory Visit (HOSPITAL_COMMUNITY): Payer: Self-pay

## 2021-02-25 NOTE — Progress Notes (Signed)
Paramedicine Encounter    Patient ID: Rhonda [name], female    DOB: Mar 12, 1951, 70 y.o.   MRN: [id number]   Patient Care Team: Maurice Small, MD as PCP - General (Family Medicine) [name], LCSW as Social Worker (Licensed Clinical Social Worker) Alda Berthold, DO as Consulting Physician (Neurology)  Patient Active Problem List   Diagnosis Date Noted   Abnormal gait 06/11/2020   Adult failure to thrive syndrome 06/11/2020   Allergic rhinitis 06/11/2020   Anxiety 06/11/2020   Asthma 06/11/2020   Benign intracranial hypertension 06/11/2020   Body mass index (BMI) 45.0-49.9, adult ([place]) 06/11/2020   Bowel incontinence 06/11/2020   Cholelithiasis 06/11/2020   Chronic sinusitis 06/11/2020   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   Daytime somnolence 06/11/2020   Diarrhea of presumed infectious origin 06/11/2020   Edema 06/11/2020   Family history of malignant neoplasm of gastrointestinal tract 06/11/2020   Gout 06/11/2020   History of fall 06/11/2020   History of infectious disease 06/11/2020   Insomnia 06/11/2020   Irregular bowel habits 06/11/2020   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   Lumbar spondylosis with myelopathy 06/11/2020   Macrocytosis 06/11/2020   Mild recurrent major depression ([place]) 06/11/2020   Personal history of colonic polyps 06/11/2020   Personal history of malignant neoplasm of breast 06/11/2020   Recurrent falls 06/11/2020   Mixed anxiety and depressive disorder 06/11/2020   Irritable bowel syndrome 01/04/2020   Spinal stenosis of lumbar region 01/03/2020   Other symptoms and signs involving the musculoskeletal system 09/11/2019   Bilateral leg weakness 08/01/2019   Leg swelling 08/01/2019   Diabetes mellitus with neuropathy ([place]) 06/15/2019   Hypokalemia 11/08/2018   CKD (chronic kidney disease) stage 3, GFR 30-59 ml/min (HCC) 11/08/2018   Orthostatic hypotension 07/28/2017   SVD (spontaneous vaginal delivery)    Peripheral neuropathy    On home oxygen therapy     Migraines    Left bundle branch block    Hypothyroidism    Hypertension    Hyperlipidemia    Heart murmur    GERD (gastroesophageal reflux disease)    Exertional shortness of breath    Major depressive disorder    Back pain    Arthritis    Generalized anxiety disorder    Anemia    Chronic respiratory failure ([place]) 09/14/2013   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   Morbid obesity ([place]) 27/78/2423   Chronic systolic heart failure ([place]) 10/27/2012   Endometrial polyp 01/20/2012   Malignant tumor of breast ([place]) 03/26/2011   Unspecified vitamin D deficiency 03/26/2011    Current Outpatient Medications:    acetaZOLAMIDE (DIAMOX) 125 MG tablet, TAKE 1 TABLET(125 MG) BY MOUTH DAILY, Disp: 60 tablet, Rfl: 0   amoxicillin-clavulanate (AUGMENTIN) 875-125 MG tablet, , Disp: , Rfl:    carvedilol (COREG) 3.125 MG tablet, TAKE 1 TABLET (3.125 MG) BY MOUTH TWICE DAILY WITH MEALS, Disp: 180 tablet, Rfl: 3   citalopram (CELEXA) 20 MG tablet, Take 20 mg by mouth daily., Disp: , Rfl:    clonazePAM (KLONOPIN) 0.5 MG tablet, , Disp: , Rfl:    dicyclomine (BENTYL) 20 MG tablet, 1 tablet, Disp: , Rfl:    DULoxetine (CYMBALTA) 30 MG capsule, , Disp: , Rfl:    DULoxetine (CYMBALTA) 60 MG capsule, , Disp: , Rfl:    fexofenadine (ALLEGRA) 180 MG tablet, Take 180 mg by mouth 2 (two) times daily., Disp: , Rfl:    fludrocortisone (FLORINEF) 0.1 MG tablet, TAKE 1 TABLET TWICE DAILY, Disp: 180  tablet, Rfl: 0   fluticasone (FLONASE) 50 MCG/ACT nasal spray, , Disp: , Rfl:    gabapentin (NEURONTIN) 300 MG capsule, Take 1 capsule by mouth 3 (three) times daily., Disp: , Rfl:    gabapentin (NEURONTIN) 800 MG tablet, TAKE 1 TABLET AT BEDTIME AS NEEDED FOR NEUROPATHY, Disp: 30 tablet, Rfl: 0   Ginkgo Biloba 120 MG CAPS, See admin instructions., Disp: , Rfl:    HYDROcodone-acetaminophen (NORCO) 10-325 MG tablet, 1 tablet as needed (Patient not taking: Reported on 02/18/2021),  Disp: , Rfl:    hydrOXYzine (ATARAX/VISTARIL) 25 MG tablet, 1 tablet as needed (Patient not taking: No sig reported), Disp: , Rfl:    hydrOXYzine (VISTARIL) 25 MG capsule, 1 capsule as needed (Patient not taking: No sig reported), Disp: , Rfl:    ipratropium (ATROVENT) 0.03 % nasal spray, Place 2 sprays into both nostrils 2 (two) times daily., Disp: , Rfl:    levothyroxine (SYNTHROID) 88 MCG tablet, TAKE 1 TABLET(88 MCG) BY MOUTH DAILY BEFORE BREAKFAST, Disp: 90 tablet, Rfl: 3   lidocaine (XYLOCAINE) 2 % jelly, , Disp: , Rfl:    metolazone (ZAROXOLYN) 2.5 MG tablet, TAKE 1 TABLET BY MOUTH EVERY TUESDAY, Disp: 12 tablet, Rfl: 3   midodrine (PROAMATINE) 10 MG tablet, TAKE 1 TABLET (10 MG TOTAL) BY MOUTH 3 (THREE) TIMES DAILY WITH MEALS., Disp: 270 tablet, Rfl: 3   mirtazapine (REMERON) 30 MG tablet, 1 tablet at bedtime., Disp: , Rfl:    montelukast (SINGULAIR) 10 MG tablet, Take 10 mg by mouth at bedtime. , Disp: , Rfl: 1   omeprazole (PRILOSEC) 40 MG capsule, Take 40 mg by mouth daily., Disp: , Rfl:    potassium chloride SA (KLOR-CON) 20 MEQ tablet, TAKE 2 TABLETS THREE TIMES DAILY. TAKE AN EXTRA 2 TABLETS ON DAYS YOU TAKE METOLAZONE, Disp: 572 tablet, Rfl: 0   probenecid (BENEMID) 500 MG tablet, Take 500 mg by mouth 2 (two) times daily., Disp: , Rfl:    simvastatin (ZOCOR) 10 MG tablet, Take 10 mg by mouth every evening., Disp: , Rfl:    spironolactone (ALDACTONE) 25 MG tablet, Take 1 tablet (25 mg total) by mouth every evening. Needs appt for further refills, Disp: 90 tablet, Rfl: 0   torsemide (DEMADEX) 20 MG tablet, Take 3 tablets (60 mg total) by mouth 2 (two) times daily. Needs appt for further refills, Disp: 540 tablet, Rfl: 0   traMADol (ULTRAM) 50 MG tablet, 1 tablet as needed (Patient not taking: No sig reported), Disp: , Rfl:  Allergies  Allergen Reactions   Ceftin Anaphylaxis    Face and throat swell    Geodon [Ziprasidone Hcl] Hives   Lisinopril Other (See Comments)    angioedema    Shellfish Allergy Other (See Comments)    Gout exacerbation   Shellfish-Derived Products     Other reaction(s): Other   Ultram [Tramadol] Itching   Allopurinol Nausea Only and Other (See Comments)    weakness   Ativan [Lorazepam] Itching   Sulfa Antibiotics Itching   Ultram [Tramadol Hcl] Itching   Valium [Diazepam] Other (See Comments)    Patient states that diazepam doesn't relax, it has the opposite effect.     Social History   Socioeconomic History   Marital status: Married    Spouse name: Not on file   Number of children: 2   Years of education: 18   Highest education level: Master's degree (e.g., MA, MS, MEng, MEd, MSW, MBA)  Occupational History   Occupation: retired  Tobacco  Use   Smoking status: Some Days    Packs/day: 0.10    Years: 26.00    Pack years: 2.60    Types: Cigarettes    Last attempt to quit: 05/06/2007    Years since quitting: 13.8   Smokeless tobacco: Never  Vaping Use   Vaping Use: Never used  Substance and Sexual Activity   Alcohol use: No   Drug use: No   Sexual activity: Yes  Other Topics Concern   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  Constitutional:      Appearance: Normal appearance. She is normal weight.  HENT:     Head: Normocephalic.     Nose: Nose normal.     Mouth/Throat:     Mouth: Mucous membranes are moist.      Pharynx: Oropharynx is clear.  Eyes:     Conjunctiva/sclera: Conjunctivae normal.     Pupils: Pupils are equal, round, and reactive to light.  Cardiovascular:     Rate and Rhythm: Normal rate and regular rhythm.     Pulses: Normal pulses.     Heart sounds: Normal heart sounds.  Pulmonary:     Effort: Pulmonary effort is normal.     Breath sounds: Normal breath sounds.  Abdominal:     General: Abdomen is flat.     Palpations: Abdomen is soft.  Musculoskeletal:        General: Swelling present.     Cervical back: Normal range of motion.     Right lower leg: Edema present.     Left lower leg: Edema present.  Skin:    General: Skin is warm and dry.     Capillary Refill: Capillary refill takes less than 2 seconds.  Neurological:     General: No focal deficit present.     Mental Status: She is alert. Mental status is at baseline.  Psychiatric:        Mood and Affect: Mood normal.    Arrived for home visit for [facility] who reports feeling okay with no complaints of shortness of breath, chest pain, dizziness. Rhonda [name] was complaint with her medications over the last week. Only two missed doses of noon dosing. Vitals and assessment as noted. Some slight BLE noted. We discussed diet and fluid restrictions. Rhonda [name] started PREP program and reports she is excited to have this as an outlet for exercise and socialization.   Medications reviewed and confirmed. 2 pill boxes filled accordingly.   Appointments reviewed with Rhonda [name].   I plan to see her in two weeks.   Home visit complete.     Future Appointments  Date Time Provider [place]  02/27/2021  9:50 AM Narda Amber K, DO [facility] None  03/19/2021  2:00 PM [facility] PA/NP  [facility] None  04/10/2021  7:40 AM [facility] DEVICE REMOTES [facility]  07/10/2021  7:40 AM [facility] DEVICE REMOTES [facility]  08/11/2021  7:40 AM [facility] DEVICE REMOTES [facility]  09/10/2021  7:40 AM  [facility] DEVICE REMOTES [facility]     ACTION: Home visit completed

## 2021-02-27 ENCOUNTER — Ambulatory Visit: Payer: Medicare HMO | Admitting: Neurology

## 2021-02-27 ENCOUNTER — Telehealth: Payer: Self-pay | Admitting: Neurology

## 2021-02-27 ENCOUNTER — Encounter: Payer: Self-pay | Admitting: Neurology

## 2021-02-27 ENCOUNTER — Other Ambulatory Visit: Payer: Self-pay

## 2021-02-27 VITALS — BP 132/73 | HR 94 | Ht 60.0 in | Wt 228.0 lb

## 2021-02-27 DIAGNOSIS — G932 Benign intracranial hypertension: Secondary | ICD-10-CM | POA: Diagnosis not present

## 2021-02-27 DIAGNOSIS — E1140 Type 2 diabetes mellitus with diabetic neuropathy, unspecified: Secondary | ICD-10-CM | POA: Diagnosis not present

## 2021-02-27 DIAGNOSIS — R419 Unspecified symptoms and signs involving cognitive functions and awareness: Secondary | ICD-10-CM | POA: Diagnosis not present

## 2021-02-27 MED ORDER — ACETAZOLAMIDE 125 MG PO TABS: 125.0000 mg | ORAL_TABLET | Freq: Every day | ORAL | 3 refills | Status: AC

## 2021-02-27 MED ORDER — FLUDROCORTISONE ACETATE 0.1 MG PO TABS: 100.0000 ug | ORAL_TABLET | Freq: Two times a day (BID) | ORAL | 3 refills | Status: DC

## 2021-02-27 NOTE — Progress Notes (Signed)
Follow-up Visit   Date: 02/27/21    Rhonda [name] MRN: [id number] DOB: 1951/04/15   Interim History: Rhonda [name] is a 70 y.o.  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  The patient was accompanied to the clinic by husband who also provides collateral information.    History of present illness: She reports having falls since November 2017, some which occurred by tripping over objects and loosing her balance.  Falls become much worse during 2018.  She often gets lightheaded preceding her falls and nearly always falls backwards.  Many of her falls were occurring in the kitchen while standing as she was preparing dinner.  She noticed that prolonged standing for 5 to 10 minutes always triggered her falls and was preceded by lightheadedness.  She was found to be orthostatic and started on midodrine 10 mg 3 times daily.   She has been diabetic for at least 10 years and has painful neuropathy of the feet, which is well-controlled on gabapentin 300mg  BID.  She takes extra gabapentin 800mg  as needed for severe pain, about once per week.    Over the past several years, she has noticed problems with short-term memory and family get frustrated with her because she is "slow".  She is able to cook and do laundry.  She manages her own medications and drives, but sometimes feels uncomfortable when driving. On one occasion, she forgot how to put her cars into her ignition or to start the wipers.  She is driving and has not been involved in any MVAs.  Her family complained that she is repeating herself often and sometimes she talks nonsensically.  She manages finances with her husband.  Prior work-up has included: CT cervical spine, CT brain.  Imaging showed increased  sclerosis of the calvarium and vertebral bodies.  Her PTH was elevated and she is seeing endocrinology.  Findings also showed possible intracranial hypertension and follow-up LP showed mildly raised ICP at 71mm H20.  There has been no change in her gait or falls following the spinal tap.  She was started on Diamox 125mg  daily.  For memory issues, she underwent neurocognitive testing, however, due to patient being very tired/sleepy, she was unable to engage in the testing, limiting formal diagnosis. However, there was evidence of severe depression and anxiety, which patient endorses.     UPDATE 02/29/20:  She is here for follow-up visit.  The frequency of her falls have reduced and her last fall was about 3 months ago. She is not sure what has helped. She continues to be forgetful, no worse than before.  Prior testing shows changes related to severe depression and anxiety.  She is followed by Dr. Casimiro Needle.  Her bigger issue is problems with her legs and gait which led to her seeing Dr. Marcello Moores at [facility].  CT myelogram shows severe spinal stenosis at L4-5 and surgery has been offered.  They are undecided on this.   UPDATE 02/27/2021:  She is accompanied by her husband for follow-up visit. She has been doing relatively stable over the past year. Her falls have significantly decreased. She had a few trips over the summer, but these were mechanical.  Her biggest concern remains her memory.  She is unable to keep up with conversations or complex tasks.  She tends  to forget to take her medication.  Her neuropsychological testing from October 2020 showed cognitive changes due to severe depression and anxiety, however, testing was also hampered by patient being very sleepy.  She would like to have this again, because of concern of dementia.  Her neuropathy is well-controlled on gabapentin 300mg  three times daily. She rarely takes an extra 800mg  tablet.   Medications:  Current Outpatient  Medications on File Prior to Visit  Medication Sig Dispense Refill   acetaZOLAMIDE (DIAMOX) 125 MG tablet TAKE 1 TABLET(125 MG) BY MOUTH DAILY 60 tablet 0   carvedilol (COREG) 3.125 MG tablet TAKE 1 TABLET (3.125 MG) BY MOUTH TWICE DAILY WITH MEALS 180 tablet 3   citalopram (CELEXA) 20 MG tablet Take 20 mg by mouth daily.     dicyclomine (BENTYL) 20 MG tablet 1 tablet     DULoxetine (CYMBALTA) 30 MG capsule      DULoxetine (CYMBALTA) 60 MG capsule      fexofenadine (ALLEGRA) 180 MG tablet Take 180 mg by mouth 2 (two) times daily.     fludrocortisone (FLORINEF) 0.1 MG tablet TAKE 1 TABLET TWICE DAILY 180 tablet 0   fluticasone (FLONASE) 50 MCG/ACT nasal spray      gabapentin (NEURONTIN) 300 MG capsule Take 1 capsule by mouth 3 (three) times daily.     gabapentin (NEURONTIN) 800 MG tablet TAKE 1 TABLET AT BEDTIME AS NEEDED FOR NEUROPATHY 30 tablet 0   Ginkgo Biloba 120 MG CAPS See admin instructions.     ipratropium (ATROVENT) 0.03 % nasal spray Place 2 sprays into both nostrils 2 (two) times daily.     levothyroxine (SYNTHROID) 88 MCG tablet TAKE 1 TABLET(88 MCG) BY MOUTH DAILY BEFORE BREAKFAST 90 tablet 3   lidocaine (XYLOCAINE) 2 % jelly      metolazone (ZAROXOLYN) 2.5 MG tablet TAKE 1 TABLET BY MOUTH EVERY TUESDAY 12 tablet 3   midodrine (PROAMATINE) 10 MG tablet TAKE 1 TABLET (10 MG TOTAL) BY MOUTH 3 (THREE) TIMES DAILY WITH MEALS. 270 tablet 3   mirtazapine (REMERON) 30 MG tablet 1 tablet at bedtime.     montelukast (SINGULAIR) 10 MG tablet Take 10 mg by mouth at bedtime.   1   omeprazole (PRILOSEC) 40 MG capsule Take 40 mg by mouth daily.     potassium chloride SA (KLOR-CON) 20 MEQ tablet TAKE 2 TABLETS THREE TIMES DAILY. TAKE AN EXTRA 2 TABLETS ON DAYS YOU TAKE METOLAZONE 572 tablet 0   probenecid (BENEMID) 500 MG tablet Take 500 mg by mouth 2 (two) times daily.     simvastatin (ZOCOR) 10 MG tablet Take 10 mg by mouth every evening.     spironolactone (ALDACTONE) 25 MG tablet Take 1  tablet (25 mg total) by mouth every evening. Needs appt for further refills 90 tablet 0   torsemide (DEMADEX) 20 MG tablet Take 3 tablets (60 mg total) by mouth 2 (two) times daily. Needs appt for further refills 540 tablet 0   traMADol (ULTRAM) 50 MG tablet 1 tablet as needed     amoxicillin-clavulanate (AUGMENTIN) 875-125 MG tablet  (Patient not taking: No sig reported)     clonazePAM (KLONOPIN) 0.5 MG tablet  (Patient not taking: Reported on 02/27/2021)     HYDROcodone-acetaminophen (NORCO) 10-325 MG tablet 1 tablet as needed (Patient not taking: No sig reported)     hydrOXYzine (ATARAX/VISTARIL) 25 MG tablet 1 tablet as needed (Patient not taking: No sig reported)     hydrOXYzine (VISTARIL) 25 MG capsule 1 capsule  as needed (Patient not taking: No sig reported)     No current facility-administered medications on file prior to visit.    Allergies:  Allergies  Allergen Reactions   Ceftin Anaphylaxis    Face and throat swell    Geodon [Ziprasidone Hcl] Hives   Lisinopril Other (See Comments)    angioedema   Shellfish Allergy Other (See Comments)    Gout exacerbation   Shellfish-Derived Products     Other reaction(s): Other   Ultram [Tramadol] Itching   Allopurinol Nausea Only and Other (See Comments)    weakness   Ativan [Lorazepam] Itching   Sulfa Antibiotics Itching   Ultram [Tramadol Hcl] Itching   Valium [Diazepam] Other (See Comments)    Patient states that diazepam doesn't relax, it has the opposite effect.    Vital Signs:  BP 132/73   Pulse 94   Ht 5' (1.524 m)   Wt 228 lb (103.4 kg)   SpO2 100%   BMI 44.53 kg/m   No data found.  Neurological Exam: MENTAL STATUS including orientation to time, place, person, recent and remote memory is attention span and concentration, language, and fund of knowledge is fair.  Year is 2020, month is September (incorrect), does not know president. Follows commands and answers questions easily.  Speech is slow and dysarthric  (baseline).  CRANIAL NERVES:  Pupils are round and reactive. Extraocular muscles are intact throughout, except mild restriction with upgaze.  No ptosis.   MOTOR:  Motor strength is 5/5 in all extremities. No pronator drift.  Tone is normal.  No tremor.   REFLEXES:  Reflexes are 2+/4 throughout except 3+/4 at the knees bilaterally.   COORDINATION/GAIT:  Gait is slightly-wide based, assisted with a cane.   Data: Labs 07/31/2016:  Vitamin B1 11, vitamin B12 597, copper 162, SPEP with IFE no M protein  CT head wo contrast performed at [facility] 11/24/2017: No acute intracranial abnormality appreciated.  Expanded empty sella may reflect idiopathic intracranial hypertension.  Thick dense calvarium which is of uncertain clinical significance.  Boney sclerosis can be seen with, but not limited to, hematological conditions such as myelofibrosis, metabolic bone disorders such as hyperthyroidism and hyperparathyroidism and other causes.  Correlate clinically.   CT cervical spine wo contrast 11/24/2017: 1.  No acute fracture 2.  No significant spinal canal or foraminal stenosis 3.  Increased sclerosis of the bony structures.  Boney sclerosis can be seen with, but not limited to, hematological conditions such as myelofibrosis, metabolic bone disorders such as hyperthyroidism and hyperparathyroidism and other causes.  Correlate clinically.   Neuropsychological testing 03/15/2019:  severe symptoms of both depression and anxiety.   IMPRESSION/PLAN: 1.  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 Worsening.     - Repeat neuropsychiatry testing   - She has not seen her psychiatry in quite some time, recommend that she follow-up with their office  2.  Falls due to orthostatic hypotension, improved and stable.   - Continue midodrine 10mg  TID prescribed by cardiology  - Continue florinef 0.1mg  BID  3.  Intracranial hypertension OP 26cm H20. Stable, no visual complaints or headaches  -  Continue Diamox 125mg  daily  4.  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  Stable - Continue gabapentin 300 mg 3 times daily an extra 800 mg for severe pain.  5.  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    - Previously seen Dr. Marcello Moores, she is not interested in surgery  Return to clinic in 1  year  Total time spent reviewing records, interview, history/exam, documentation, and coordination of care on day of encounter:  30 min    Thank you for allowing me to participate in patient's care.  If I can answer any additional questions, I would be pleased to do so.    Sincerely,

## 2021-02-27 NOTE — Patient Instructions (Signed)
Neurocognitive testing   You have been referred for a neurocognitive evaluation (i.e., evaluation of memory and thinking abilities). Please bring someone with you to this appointment if possible, as it is helpful for the neuropsychologist to hear from both you and another adult who knows you well. Please bring eyeglasses and hearing aids if you wear them.    The evaluation will take approximately 3 hours and has two parts:   The first part is a clinical interview with the neuropsychologist, Dr. Melvyn Novas. During the interview, the neuropsychologist will speak with you and the individual you brought to the appointment.    The second part of the evaluation is testing with the doctor's technician, aka psychometrician, Hinton Dyer or [employer]. During the testing, the technician will ask you to remember different types of material, solve problems, and answer some questionnaires. Your family member will not be present for this portion of the evaluation.   Please note: We have to reserve several hours of the neuropsychologist's time and the psychometrician's time for your evaluation appointment. As such, there is a No-Show fee of $100. If you are unable to attend any of your appointments, please contact our office as soon as possible to reschedule.

## 2021-02-27 NOTE — Telephone Encounter (Signed)
Patient called and said she forgot to tell Dr. [name] she has been taking Prevogen OTC.   She said it does cost a lot but seems to help. However, she can't afford it at $50 a bottle.  Patient has been taking it for a good four month.  She'd like Dr. Serita Grit recommendation.  Patient states she notices a difference when she does not take it.  Is there something by prescription that my be similar to Prevogen available.  [facility] on [address].

## 2021-02-28 ENCOUNTER — Telehealth: Payer: Self-pay

## 2021-02-28 DIAGNOSIS — N1832 Chronic kidney disease, stage 3b: Secondary | ICD-10-CM | POA: Diagnosis not present

## 2021-02-28 DIAGNOSIS — Z0000 Encounter for general adult medical examination without abnormal findings: Secondary | ICD-10-CM | POA: Diagnosis not present

## 2021-02-28 DIAGNOSIS — E785 Hyperlipidemia, unspecified: Secondary | ICD-10-CM | POA: Diagnosis not present

## 2021-02-28 DIAGNOSIS — I5022 Chronic systolic (congestive) heart failure: Secondary | ICD-10-CM | POA: Diagnosis not present

## 2021-02-28 DIAGNOSIS — I447 Left bundle-branch block, unspecified: Secondary | ICD-10-CM | POA: Diagnosis not present

## 2021-02-28 DIAGNOSIS — I10 Essential (primary) hypertension: Secondary | ICD-10-CM | POA: Diagnosis not present

## 2021-02-28 DIAGNOSIS — E1140 Type 2 diabetes mellitus with diabetic neuropathy, unspecified: Secondary | ICD-10-CM | POA: Diagnosis not present

## 2021-02-28 DIAGNOSIS — F330 Major depressive disorder, recurrent, mild: Secondary | ICD-10-CM | POA: Diagnosis not present

## 2021-02-28 DIAGNOSIS — R296 Repeated falls: Secondary | ICD-10-CM | POA: Diagnosis not present

## 2021-02-28 NOTE — Telephone Encounter (Signed)
Remote ICM transmission received.  Attempted call to patient regarding ICM remote transmission and left detailed message per DPR.  Advised to return call for any fluid symptoms or questions. Next ICM remote transmission scheduled 03/31/2021.

## 2021-02-28 NOTE — Telephone Encounter (Signed)
There are many supplements marketed for brain health, none of which are FDA approved.  I am not familiar with Prevagon and I do not recommend them.  I do suggest that she take a daily multivitamin.

## 2021-02-28 NOTE — Telephone Encounter (Signed)
Called patient and informed her of Dr. Serita Grit Advice and recommendations. Patient verbalized understanding and will get a multi-vitamin.

## 2021-02-28 NOTE — Progress Notes (Signed)
EPIC Encounter for ICM Monitoring  Patient Name: Rhonda [name] is a 69 y.o. female Date: 02/28/2021 Primary Care Physican: Maurice Small, MD Primary Cardiologist: [place] Electrophysiologist: Santina Evans Pacing: 98.6%   12/31/2020  Weight: 231 lbs      Attempted call to patient and unable to reach.  Left detailed message per DPR regarding transmission. Transmission reviewed.    Optivol thoracic impedance suggesting normal fluid levels.    Prescribed:  Patient participating in paramedicine program to assist with meds. Torsemide 20 mg 3 tablets (60 mg total) by mouth twice a day.   Potassium  20 meq 2 tablets (40 mEq total) by mouth 3 (three) times daily.  Take extra 2 tablets on days you take Metolazone. Metolazone 2.5mg  1 tablet every Tuesday. Spironolactone 25 mg take 1 tablet by mouth every evening.   Labs: 04/09/2020 Creatinine 1.44, BUN 40, Potassium 4.9, Sodium 142 03/27/2020 Creatinine 1.71, BUN 33, Potassium 3.8, Sodium 141, GFR 32 03/14/2020 Creatinine 1.67, BUN 30, Potassium 3.4, Sodium 139, GFR 33  A complete set of results can be found in Results Review.   Recommendations:  Left voice mail with ICM number and encouraged to call if experiencing any fluid symptoms.   Follow-up plan: ICM clinic phone appointment on 03/31/2021.   91 day device clinic remote transmission 04/10/2021.     EP/Cardiology Office Visits:  Recall 09/10/2020 with Dr Haroldine Laws.  Recall 07/26/2021 with Dr Lovena Le.     Copy of ICM check sent to Dr. Lovena Le.        3 month ICM trend: 02/24/2021.    1 Year ICM trend:       [name], RN 02/28/2021 12:37 PM

## 2021-03-04 ENCOUNTER — Telehealth: Payer: Self-pay

## 2021-03-04 DIAGNOSIS — M48061 Spinal stenosis, lumbar region without neurogenic claudication: Secondary | ICD-10-CM | POA: Diagnosis not present

## 2021-03-04 DIAGNOSIS — Z79899 Other long term (current) drug therapy: Secondary | ICD-10-CM | POA: Diagnosis not present

## 2021-03-04 DIAGNOSIS — F331 Major depressive disorder, recurrent, moderate: Secondary | ICD-10-CM | POA: Diagnosis not present

## 2021-03-04 DIAGNOSIS — Z515 Encounter for palliative care: Secondary | ICD-10-CM | POA: Diagnosis not present

## 2021-03-04 DIAGNOSIS — G3183 Dementia with Lewy bodies: Secondary | ICD-10-CM | POA: Diagnosis not present

## 2021-03-04 NOTE — Progress Notes (Signed)
[employer] PREP Weekly Session  Patient Details  Name: Rhonda [name] MRN: [id number] Date of Birth: Oct 01, 1950 Age: 70 y.o. PCP: Maurice Small, MD (Inactive)  Vitals:   03/03/21 1330  Weight: 230 lb (104.3 kg)     [facility] Weekly seesion - 03/04/21 1100       [facility] "PREP" Location   [facility] "PREP" [facility]      Weekly Session   Topic Discussed Importance of resistance training;Other ways to be active    Minutes exercised this week 25 minutes    Classes attended to date 3            Class held on 03/03/21  [name] 03/04/2021, 11:24 AM

## 2021-03-04 NOTE — Telephone Encounter (Signed)
Call from patient to say that she needed to tell me that she has diabetes Doesn't take any medicine for it.  Advised exercise will help as well as avoiding things that turn quickly to sugar Inquired about a meal service she received previously but doesn't know why it stopped. She thought that would help her get on track Also would benefit from a dietician  Will ask SW to follow up to inquire about meals

## 2021-03-05 ENCOUNTER — Telehealth (HOSPITAL_COMMUNITY): Payer: Self-pay | Admitting: Licensed Clinical Social Worker

## 2021-03-05 ENCOUNTER — Encounter (HOSPITAL_COMMUNITY): Payer: Medicare HMO

## 2021-03-05 NOTE — Telephone Encounter (Signed)
Patient identified as a candidate to receive 7 heart healthy meals per week for 4 weeks through [facility].  Completed referral sent in for review.  Anticipate patient will receive first shipment of food in 1-3 business days.  Trammell Bowden H. AmeLie Hollars, LCSW Clinical Social Worker [facility] Desk#: [phone] Cell#: [phone]  

## 2021-03-11 ENCOUNTER — Other Ambulatory Visit (HOSPITAL_COMMUNITY): Payer: Self-pay

## 2021-03-11 NOTE — Progress Notes (Signed)
Paramedicine Encounter    Patient ID: Rhonda [name], female    DOB: June 30, 1950, 70 y.o.   MRN: [id number]   Patient Care Team: Maurice Small, MD (Inactive) as PCP - General (Family Medicine) [name], LCSW as Social Worker (Licensed Clinical Social Worker) Alda Berthold, DO as Consulting Physician (Neurology)  Patient Active Problem List   Diagnosis Date Noted   Abnormal gait 06/11/2020   Adult failure to thrive syndrome 06/11/2020   Allergic rhinitis 06/11/2020   Anxiety 06/11/2020   Asthma 06/11/2020   Benign intracranial hypertension 06/11/2020   Body mass index (BMI) 45.0-49.9, adult ([place]) 06/11/2020   Bowel incontinence 06/11/2020   Cholelithiasis 06/11/2020   Chronic sinusitis 06/11/2020   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   Daytime somnolence 06/11/2020   Diarrhea of presumed infectious origin 06/11/2020   Edema 06/11/2020   Family history of malignant neoplasm of gastrointestinal tract 06/11/2020   Gout 06/11/2020   History of fall 06/11/2020   History of infectious disease 06/11/2020   Insomnia 06/11/2020   Irregular bowel habits 06/11/2020   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   Lumbar spondylosis with myelopathy 06/11/2020   Macrocytosis 06/11/2020   Mild recurrent major depression ([place]) 06/11/2020   Personal history of colonic polyps 06/11/2020   Personal history of malignant neoplasm of breast 06/11/2020   Recurrent falls 06/11/2020   Mixed anxiety and depressive disorder 06/11/2020   Irritable bowel syndrome 01/04/2020   Spinal stenosis of lumbar region 01/03/2020   Other symptoms and signs involving the musculoskeletal system 09/11/2019   Bilateral leg weakness 08/01/2019   Leg swelling 08/01/2019   Diabetes mellitus with neuropathy ([place]) 06/15/2019   Hypokalemia 11/08/2018   CKD (chronic kidney disease) stage 3, GFR 30-59 ml/min (HCC) 11/08/2018   Orthostatic hypotension 07/28/2017   SVD (spontaneous vaginal delivery)    Peripheral neuropathy    On home oxygen  therapy    Migraines    Left bundle branch block    Hypothyroidism    Hypertension    Hyperlipidemia    Heart murmur    GERD (gastroesophageal reflux disease)    Exertional shortness of breath    Major depressive disorder    Back pain    Arthritis    Generalized anxiety disorder    Anemia    Chronic respiratory failure ([place]) 09/14/2013   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   Morbid obesity ([place]) 41/32/4401   Chronic systolic heart failure ([place]) 10/27/2012   Endometrial polyp 01/20/2012   Malignant tumor of breast ([place]) 03/26/2011   Unspecified vitamin D deficiency 03/26/2011    Current Outpatient Medications:    acetaZOLAMIDE (DIAMOX) 125 MG tablet, Take 1 tablet (125 mg total) by mouth daily., Disp: 90 tablet, Rfl: 3   amoxicillin-clavulanate (AUGMENTIN) 875-125 MG tablet, , Disp: , Rfl:    carvedilol (COREG) 3.125 MG tablet, TAKE 1 TABLET (3.125 MG) BY MOUTH TWICE DAILY WITH MEALS, Disp: 180 tablet, Rfl: 3   citalopram (CELEXA) 20 MG tablet, Take 20 mg by mouth daily., Disp: , Rfl:    clonazePAM (KLONOPIN) 0.5 MG tablet, , Disp: , Rfl:    dicyclomine (BENTYL) 20 MG tablet, 1 tablet, Disp: , Rfl:    DULoxetine (CYMBALTA) 30 MG capsule, , Disp: , Rfl:    DULoxetine (CYMBALTA) 60 MG capsule, , Disp: , Rfl:    fexofenadine (ALLEGRA) 180 MG tablet, Take 180 mg by mouth 2 (two) times daily., Disp: , Rfl:    fludrocortisone (FLORINEF) 0.1 MG tablet, Take 1 tablet (100  mcg total) by mouth 2 (two) times daily., Disp: 180 tablet, Rfl: 3   fluticasone (FLONASE) 50 MCG/ACT nasal spray, , Disp: , Rfl:    gabapentin (NEURONTIN) 300 MG capsule, Take 1 capsule by mouth 3 (three) times daily., Disp: , Rfl:    gabapentin (NEURONTIN) 800 MG tablet, TAKE 1 TABLET AT BEDTIME AS NEEDED FOR NEUROPATHY, Disp: 30 tablet, Rfl: 0   Ginkgo Biloba 120 MG CAPS, See admin instructions., Disp: , Rfl:    HYDROcodone-acetaminophen (NORCO) 10-325 MG tablet, 1 tablet as  needed (Patient not taking: No sig reported), Disp: , Rfl:    hydrOXYzine (ATARAX/VISTARIL) 25 MG tablet, 1 tablet as needed (Patient not taking: No sig reported), Disp: , Rfl:    hydrOXYzine (VISTARIL) 25 MG capsule, 1 capsule as needed (Patient not taking: No sig reported), Disp: , Rfl:    ipratropium (ATROVENT) 0.03 % nasal spray, Place 2 sprays into both nostrils 2 (two) times daily., Disp: , Rfl:    levothyroxine (SYNTHROID) 88 MCG tablet, TAKE 1 TABLET(88 MCG) BY MOUTH DAILY BEFORE BREAKFAST, Disp: 90 tablet, Rfl: 3   lidocaine (XYLOCAINE) 2 % jelly, , Disp: , Rfl:    metolazone (ZAROXOLYN) 2.5 MG tablet, TAKE 1 TABLET BY MOUTH EVERY TUESDAY, Disp: 12 tablet, Rfl: 3   midodrine (PROAMATINE) 10 MG tablet, TAKE 1 TABLET (10 MG TOTAL) BY MOUTH 3 (THREE) TIMES DAILY WITH MEALS., Disp: 270 tablet, Rfl: 3   mirtazapine (REMERON) 30 MG tablet, 1 tablet at bedtime., Disp: , Rfl:    montelukast (SINGULAIR) 10 MG tablet, Take 10 mg by mouth at bedtime. , Disp: , Rfl: 1   omeprazole (PRILOSEC) 40 MG capsule, Take 40 mg by mouth daily., Disp: , Rfl:    potassium chloride SA (KLOR-CON) 20 MEQ tablet, TAKE 2 TABLETS THREE TIMES DAILY. TAKE AN EXTRA 2 TABLETS ON DAYS YOU TAKE METOLAZONE, Disp: 572 tablet, Rfl: 0   probenecid (BENEMID) 500 MG tablet, Take 500 mg by mouth 2 (two) times daily., Disp: , Rfl:    simvastatin (ZOCOR) 10 MG tablet, Take 10 mg by mouth every evening., Disp: , Rfl:    spironolactone (ALDACTONE) 25 MG tablet, Take 1 tablet (25 mg total) by mouth every evening. Needs appt for further refills, Disp: 90 tablet, Rfl: 0   torsemide (DEMADEX) 20 MG tablet, Take 3 tablets (60 mg total) by mouth 2 (two) times daily. Needs appt for further refills, Disp: 540 tablet, Rfl: 0   traMADol (ULTRAM) 50 MG tablet, 1 tablet as needed, Disp: , Rfl:  Allergies  Allergen Reactions   Ceftin Anaphylaxis    Face and throat swell    Geodon [Ziprasidone Hcl] Hives   Lisinopril Other (See Comments)     angioedema   Shellfish Allergy Other (See Comments)    Gout exacerbation   Shellfish-Derived Products     Other reaction(s): Other   Ultram [Tramadol] Itching   Allopurinol Nausea Only and Other (See Comments)    weakness   Ativan [Lorazepam] Itching   Sulfa Antibiotics Itching   Ultram [Tramadol Hcl] Itching   Valium [Diazepam] Other (See Comments)    Patient states that diazepam doesn't relax, it has the opposite effect.     Social History   Socioeconomic History   Marital status: Married    Spouse name: Not on file   Number of children: 2   Years of education: 18   Highest education level: Master's degree (e.g., MA, MS, MEng, MEd, MSW, MBA)  Occupational History  Occupation: retired  Tobacco Use   Smoking status: Some Days    Packs/day: 0.10    Years: 26.00    Pack years: 2.60    Types: Cigarettes    Last attempt to quit: 05/06/2007    Years since quitting: 13.8   Smokeless tobacco: Never  Vaping Use   Vaping Use: Never used  Substance and Sexual Activity   Alcohol use: No   Drug use: No   Sexual activity: Yes  Other Topics Concern   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  Constitutional:      Appearance: Normal appearance. She is obese.  HENT:     Head: Normocephalic.     Nose: Nose normal.     Mouth/Throat:     Mouth: Mucous membranes are  moist.     Pharynx: Oropharynx is clear.  Eyes:     Conjunctiva/sclera: Conjunctivae normal.     Pupils: Pupils are equal, round, and reactive to light.  Cardiovascular:     Rate and Rhythm: Normal rate and regular rhythm.     Pulses: Normal pulses.     Heart sounds: Normal heart sounds.  Pulmonary:     Effort: Pulmonary effort is normal.  Abdominal:     General: Abdomen is flat.     Palpations: Abdomen is soft.  Musculoskeletal:        General: Swelling present. Normal range of motion.     Cervical back: Normal range of motion.     Right lower leg: Edema present.     Left lower leg: Edema present.  Skin:    General: Skin is warm and dry.     Capillary Refill: Capillary refill takes less than 2 seconds.  Neurological:     General: No focal deficit present.     Mental Status: She is alert. Mental status is at baseline.  Psychiatric:        Mood and Affect: Mood normal.    Arrived for home visit for [facility] who reports feeling sore from her exercise class with the PREP program. She reports she goes MWF every week. She states she is glad she is doing this but it does make her tired and sore. Divine denied chest pain, dizziness, shortness of breath. Eugenie did have a weight gain from last visit. Ivi continues to eat high carb, high calorie meals and some salty snacks as well as drinking sweet teas. I advised her the importance of limiting these things to help her lose weight. She agreed. MOMS MEALS were delivered and she is going to start eating same this week. I will seek out Nutritionist resources for [employer]. She was agreeable.   Vitals are as noted. Lower legs swollen with some mild edema. Milan missed several evening  and bedtime doses over the last week. I encouraged her to remember to take her medications and even set a reminder/alarm. She agreed with this plan.   I will see her in one week in clinic, she was agreeable and I wrote down times and gate code#. Home visit  complete. I will see Diandra in one week.   Refills: Gabapentin   Future Appointments  Date Time Provider   03/19/2021  2:00 PM [facility] PA/NP [facility] None  03/31/2021  7:30 AM [facility] DEVICE REMOTES [facility]  04/10/2021  7:40 AM [facility] DEVICE REMOTES [facility]  07/10/2021  7:40 AM [facility] DEVICE REMOTES [facility]  07/31/2021  1:00 PM Hazle Coca, PhD [facility] None  07/31/2021  2:00 PM [facility] NEUROPSYCH TECH [facility] None  08/07/2021 10:00 AM Hazle Coca, PhD [facility] None  08/11/2021  7:40 AM [facility] DEVICE REMOTES [facility]  09/10/2021  7:40 AM [facility] DEVICE REMOTES [facility]  03/04/2022  8:50 AM Patel, Arvin Collard K, DO [facility] None     ACTION: Home visit completed

## 2021-03-13 ENCOUNTER — Other Ambulatory Visit (HOSPITAL_COMMUNITY): Payer: Self-pay | Admitting: *Deleted

## 2021-03-13 DIAGNOSIS — I5022 Chronic systolic (congestive) heart failure: Secondary | ICD-10-CM

## 2021-03-18 NOTE — Progress Notes (Signed)
ADVANCED HF CLINIC NOTE   Patient ID: Rhonda [name], female   DOB: 1950-12-02, 70 y.o.   MRN: [id number]  Primary Cardiologist: Dr Radford Pax  General Surgeon: Dr Donne Hazel  Orthopedic: Dr Alvan Dame  PCP: Dr Justin Mend HF Cardiologist: Dr. Haroldine Laws  HPI: Rhonda [name] is a 70 y.o. 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 She has had sleep study with no  evidence of sleep apnea.  She has a Medtronic CRT-D device. Echo in 7/18 showed recovery of EF to 55-60%.   Has been followed by Narda Amber in Neurology. Has been on Florinef and midodrine for orthostatic hypotension (failed mestinon).   Last seen 02/2020 with stable NYHA II symptoms, volume controlled on torsemide 60/40 + metolazone weekly.  Today she returns for HF follow up with Paramedicine. She has gained about 20 lbs over past 5 months. She uses a walker for balance and is doing PREP 3 days a week.  Some SOB with stairs or walking further distances on flat ground.  Denies CP, dizziness, edema, or PND/Orthopnea. Appetite ok, drinking a lot of sweet tea. No fever or chills. Weight at home 234 pounds. Previously at 190 lbs. Taking all medications.   Device interrogation (personally reviewed): OptiVol OK but trending up, thoracic impedence down, >99% v-pacing, no AT/AF Activity ~ 2 hours/day.  PYP 4/19 negative TTR  10/14/12 Echo: EF 35% RV ok  04/12/13 Echo: EF 20-25%, LV moderately dilated 10/11/2013 Echo: EF 45-50% 7/18 Echo: EF 55-60%, normal RV size and systolic function, PASP 34 mmHg 06/07/19 Echo: EF 60-65% grade II DD. RV ok  11/24/12 Creatinine 1.23 Potassium 4.1 12/15/12 Creatinine 2.03 Potassium 3.3  12/22/12  Creatinine 1.2 Potassium 3.8 02/23/13 Creatinine 1.68 K 4.4  05/02/13 K 4.2 Creatinine 1.8  10/11/13 K 3.7 Creatinine 1.69 10/17 K 4.1, creatinine 1.35 09/2017: K3.8 Creatinine 1.57  Review of systems complete and found to be negative unless listed in HPI.   Past Medical History:  Diagnosis Date   Anemia    Arthritis    Right knee   Asthma    Back pain    Disk problem   Breast cancer, stage 1 ([place]) 03/26/2011   Left; completed chemotherapy and radiation treatments   Cardiomyopathy    Chronic respiratory failure ([place]) 0/86/5784   Chronic systolic heart failure (HCC)    a) NICM b) ECHO (03/2013) EF 20-25% c) ECHO (09/2013) EF 45-50%, grade I DD   CKD (chronic kidney disease) stage 3, GFR 30-59 ml/min (HCC) 6/96/2952   Complication of anesthesia    History of low blood pressure after surgery; attributed to lying flat   Diabetes mellitus    "diet controlled" (05/03/2013)   Exertional shortness of breath    Generalized anxiety disorder    GERD (gastroesophageal reflux disease)    Gout    Heart murmur    Hyperlipidemia    Hypertension    Hypokalemia 11/08/2018   Hypothyroidism    Left bundle branch block    s/p CRT-D (04/2013)   Major depressive disorder    Migraines    On home oxygen therapy    "2L suppose to be q night" (05/03/2013)   Orthostatic hypotension 07/28/2017  Peripheral neuropathy    Feet   SVD (spontaneous vaginal delivery)    x 2   Unspecified vitamin D deficiency 03/26/2011   Does not take meds   Current Outpatient Medications  Medication Sig Dispense Refill   acetaZOLAMIDE (DIAMOX) 125 MG tablet Take 1 tablet (125 mg total) by mouth daily. 90 tablet 3   carvedilol (COREG) 3.125 MG tablet TAKE 1 TABLET (3.125 MG) BY MOUTH TWICE DAILY WITH MEALS 180 tablet 3   citalopram (CELEXA) 40 MG tablet Take 40 mg by mouth daily.     clonazePAM (KLONOPIN) 0.5 MG tablet      dicyclomine (BENTYL) 20 MG tablet Take 20 mg by mouth 3 (three) times daily before meals.      DULoxetine (CYMBALTA) 60 MG capsule Take 60 mg by mouth daily.     fexofenadine (ALLEGRA) 180 MG tablet Take 180 mg by mouth 2 (two) times daily.     fludrocortisone (FLORINEF) 0.1 MG tablet Take 1 tablet (100 mcg total) by mouth 2 (two) times daily. 180 tablet 3   fluticasone (FLONASE) 50 MCG/ACT nasal spray      gabapentin (NEURONTIN) 300 MG capsule Take 1 capsule by mouth 3 (three) times daily.     gabapentin (NEURONTIN) 800 MG tablet TAKE 1 TABLET AT BEDTIME AS NEEDED FOR NEUROPATHY 30 tablet 0   gabapentin (NEURONTIN) 800 MG tablet Take 800 mg by mouth at bedtime.     Ginkgo Biloba 120 MG CAPS See admin instructions.     ipratropium (ATROVENT) 0.03 % nasal spray Place 2 sprays into both nostrils 2 (two) times daily.     levothyroxine (SYNTHROID) 88 MCG tablet TAKE 1 TABLET(88 MCG) BY MOUTH DAILY BEFORE BREAKFAST 90 tablet 3   metolazone (ZAROXOLYN) 2.5 MG tablet TAKE 1 TABLET BY MOUTH EVERY TUESDAY 12 tablet 3   midodrine (PROAMATINE) 10 MG tablet TAKE 1 TABLET (10 MG TOTAL) BY MOUTH 3 (THREE) TIMES DAILY WITH MEALS. 270 tablet 3   mirtazapine (REMERON) 30 MG tablet 1 tablet at bedtime.     montelukast (SINGULAIR) 10 MG tablet Take 10 mg by mouth at bedtime.   1   Multiple Vitamin (MULTIVITAMIN) tablet Take 1 tablet by mouth daily.     omeprazole (PRILOSEC) 40 MG capsule Take 40 mg by mouth daily.     potassium chloride SA (KLOR-CON) 20 MEQ tablet TAKE 2 TABLETS THREE TIMES DAILY. TAKE AN EXTRA 2 TABLETS ON DAYS YOU TAKE METOLAZONE 572 tablet 0   probenecid (BENEMID) 500 MG tablet Take 500 mg by mouth 2 (two) times daily.     simvastatin (ZOCOR) 10 MG tablet Take 10 mg by mouth every evening.     spironolactone (ALDACTONE) 25 MG tablet Take 1 tablet (25 mg total) by mouth every evening. Needs appt for further refills 90 tablet 0   torsemide (DEMADEX) 20 MG tablet Take 3 tablets (60 mg total) by mouth 2 (two) times daily. Needs appt for further refills 540 tablet 0   No current  facility-administered medications for this encounter.   BP 130/70   Pulse 72   Wt 106.2 kg (234 lb 3.2 oz)   SpO2 99%   BMI 45.74 kg/m   Wt Readings from Last 3 Encounters:  03/19/21 106.2 kg (234 lb 3.2 oz)  03/11/21 104.8 kg (231 lb)  03/03/21 104.3 kg (230 lb)    PHYSICAL EXAM: General:  NAD. No resp difficulty, walked into clinic with cane HEENT: Normal Neck: Supple. No JVD. Carotids 2+ bilat; no  bruits. No lymphadenopathy or thryomegaly appreciated. Cor: PMI nondisplaced. Regular rate & rhythm. No rubs, gallops or murmurs. Lungs: Clear Abdomen: Obese,  nontender, nondistended. No hepatosplenomegaly. No bruits or masses. Good bowel sounds. Extremities: No cyanosis, clubbing, rash, 1+ BLE edema Neuro: Alert & oriented x 3, cranial nerves grossly intact. Moves all 4 extremities w/o difficulty. Affect pleasant.  ASSESSMENT & PLAN: 1) Chronic systolic HF:  - NICM s/p Medtronic CRT-D, ?Viral myocarditis. Cardiomyopathy dates from 15.  - EF 45-50% (09/2013)  - EF up to 55-60% on 20202.  - PYP 09/02/17 negative for TTR (Grade 0-1, H/CCL 1.2). SPEP with no M-spike.  - Echo 06/07/19: EF 60-65% grade II DD. RV ok.  - NYHA II. Exam difficult for volume, but she looks mildly volume up on exam and Optivol trending up. I think some of her weight gain can be attributed to increased caloric intake. - Increase torsemide to 80 mg q am/60 mg q pm+ metolazone weekly on Tuesdays.   - Continue carvedilol 3.125 mg bid. - Continue spironolactone 25 mg daily.  - No SGT2i with chronic yeast infections. - She needs to wear her compression hose. We discussed this today. - BMET/BNP today.   2) Obesity - Body mass index is 45.74 kg/m. - Discussed portion control.  - Continue PREP program.  3) HTN - Stable.  - BMET today.  4) Orthostatic Hypotension.  - Neurology managing. She is on diamox for increased ICP - Continue Florinef and midodrine 10 mg tid.   5) Chronic Venous stasis - Plan as  above.  - Place compression stockings and elevation. Given Rx today.  6) Falls  - No recent falls. - Improved. Seeing Neurology.   7) CKD 3 - Creatinine baseline 1.4-1.6 - Check BMET   Follow up in 3-4 weeks with APP to reassess volume and 3-4 months with Dr. Haroldine Laws. Continue HF Paramedicine, appreciate their assistance.   Rafael Bihari FNP 2:23 PM

## 2021-03-19 ENCOUNTER — Ambulatory Visit (HOSPITAL_COMMUNITY)
Admission: RE | Admit: 2021-03-19 | Discharge: 2021-03-19 | Disposition: A | Discharge status: Home / Self Care | Payer: Medicare HMO | Source: Ambulatory Visit | Attending: Family Medicine | Admitting: Family Medicine

## 2021-03-19 ENCOUNTER — Other Ambulatory Visit (HOSPITAL_COMMUNITY): Payer: Self-pay

## 2021-03-19 ENCOUNTER — Other Ambulatory Visit: Payer: Self-pay

## 2021-03-19 ENCOUNTER — Encounter (HOSPITAL_COMMUNITY): Payer: Self-pay

## 2021-03-19 VITALS — BP 130/70 | HR 72 | Wt 234.2 lb

## 2021-03-19 DIAGNOSIS — Z853 Personal history of malignant neoplasm of breast: Secondary | ICD-10-CM | POA: Insufficient documentation

## 2021-03-19 DIAGNOSIS — I5022 Chronic systolic (congestive) heart failure: Secondary | ICD-10-CM | POA: Insufficient documentation

## 2021-03-19 DIAGNOSIS — Z79890 Hormone replacement therapy (postmenopausal): Secondary | ICD-10-CM | POA: Diagnosis not present

## 2021-03-19 DIAGNOSIS — Z79899 Other long term (current) drug therapy: Secondary | ICD-10-CM | POA: Insufficient documentation

## 2021-03-19 DIAGNOSIS — I428 Other cardiomyopathies: Secondary | ICD-10-CM | POA: Diagnosis not present

## 2021-03-19 DIAGNOSIS — I130 Hypertensive heart and chronic kidney disease with heart failure and stage 1 through stage 4 chronic kidney disease, or unspecified chronic kidney disease: Secondary | ICD-10-CM | POA: Diagnosis not present

## 2021-03-19 DIAGNOSIS — E039 Hypothyroidism, unspecified: Secondary | ICD-10-CM | POA: Insufficient documentation

## 2021-03-19 DIAGNOSIS — I872 Venous insufficiency (chronic) (peripheral): Secondary | ICD-10-CM

## 2021-03-19 DIAGNOSIS — E1122 Type 2 diabetes mellitus with diabetic chronic kidney disease: Secondary | ICD-10-CM | POA: Diagnosis not present

## 2021-03-19 DIAGNOSIS — I5032 Chronic diastolic (congestive) heart failure: Secondary | ICD-10-CM | POA: Diagnosis not present

## 2021-03-19 DIAGNOSIS — E1142 Type 2 diabetes mellitus with diabetic polyneuropathy: Secondary | ICD-10-CM | POA: Diagnosis not present

## 2021-03-19 DIAGNOSIS — Z6841 Body Mass Index (BMI) 40.0 and over, adult: Secondary | ICD-10-CM | POA: Insufficient documentation

## 2021-03-19 DIAGNOSIS — Z923 Personal history of irradiation: Secondary | ICD-10-CM | POA: Insufficient documentation

## 2021-03-19 DIAGNOSIS — Z9981 Dependence on supplemental oxygen: Secondary | ICD-10-CM | POA: Insufficient documentation

## 2021-03-19 DIAGNOSIS — Z9221 Personal history of antineoplastic chemotherapy: Secondary | ICD-10-CM | POA: Diagnosis not present

## 2021-03-19 DIAGNOSIS — I10 Essential (primary) hypertension: Secondary | ICD-10-CM

## 2021-03-19 DIAGNOSIS — E785 Hyperlipidemia, unspecified: Secondary | ICD-10-CM | POA: Diagnosis not present

## 2021-03-19 DIAGNOSIS — N183 Chronic kidney disease, stage 3 unspecified: Secondary | ICD-10-CM | POA: Insufficient documentation

## 2021-03-19 DIAGNOSIS — I951 Orthostatic hypotension: Secondary | ICD-10-CM | POA: Insufficient documentation

## 2021-03-19 LAB — BASIC METABOLIC PANEL
Anion gap: 8 (ref 5–15)
BUN: 29 mg/dL — ABNORMAL HIGH (ref 8–23)
CO2: 31 mmol/L (ref 22–32)
Calcium: 9.1 mg/dL (ref 8.9–10.3)
Chloride: 99 mmol/L (ref 98–111)
Creatinine, Ser: 1.58 mg/dL — ABNORMAL HIGH (ref 0.44–1.00)
GFR, Estimated: 35 mL/min — ABNORMAL LOW (ref >60)
Glucose, Bld: 108 mg/dL — ABNORMAL HIGH (ref 70–99)
Potassium: 3.9 mmol/L (ref 3.5–5.1)
Sodium: 138 mmol/L (ref 135–145)

## 2021-03-19 LAB — BRAIN NATRIURETIC PEPTIDE: B Natriuretic Peptide: 68.6 pg/mL (ref 0.0–100.0)

## 2021-03-19 MED ORDER — TORSEMIDE 20 MG PO TABS: ORAL_TABLET | ORAL | 3 refills | Status: DC

## 2021-03-19 NOTE — Patient Instructions (Signed)
INCREASE Torsemide to 80 mg in the AM and 60 mg in the PM  Labs today We will only contact you if something comes back abnormal or we need to make some changes. Otherwise no news is good news!  Your physician recommends that you schedule a follow-up appointment in: 3-4 weeks  in the Advanced Practitioners (PA/NP) Clinic and in 3-4 months with Dr Haroldine Laws  Do the following things EVERYDAY: Weigh yourself in the morning before breakfast. Write it down and keep it in a log. Take your medicines as prescribed Eat low salt foods--Limit salt (sodium) to 2000 mg per day.  Stay as active as you can everyday Limit all fluids for the day to less than 2 liters  At the [facility], you and your health needs are our priority. As part of our continuing mission to provide you with exceptional heart care, we have created designated Provider Care Teams. These Care Teams include your primary Cardiologist (physician) and Advanced Practice Providers (APPs- Physician Assistants and Nurse Practitioners) who all work together to provide you with the care you need, when you need it.   You may see any of the following providers on your designated Care Team at your next follow up: Dr Glori Bickers Dr Haynes Kerns, NP Lyda Jester, [place] [facility] [place], [place] Audry Riles, PharmD   Please be sure to bring in all your medications bottles to every appointment.   If you have any questions or concerns before your next appointment please send [place] a message through [place] or call our office at [phone].    TO LEAVE A MESSAGE FOR THE NURSE SELECT OPTION 2, PLEASE LEAVE A MESSAGE INCLUDING: YOUR NAME DATE OF BIRTH CALL BACK NUMBER REASON FOR CALL**this is important as we prioritize the call backs  YOU WILL RECEIVE A CALL BACK THE SAME DAY AS LONG AS YOU CALL BEFORE 4:00 PM

## 2021-03-19 NOTE — Progress Notes (Signed)
Paramedicine Encounter    Patient ID: Rhonda [name], female    DOB: 30-Apr-1951, 70 y.o.   MRN: [id number]  Met with Mrs. Veras in clinic today where she was feeling good complaining of some shortness of breath, with weight gain. Mizani admits to eating and drinking thinks she shouldn't be. We reviewed foods she should be eating and I provided her and her husband with the Living with Heart Failure Book for guidance.   [name] NP saw [name] today and increased Torsemide to 18m in the morning and 675min the evening. She will follow up with [facility] clinic in 3-4 weeks. I filled one week of pill box for Mrs. Holwerda and reviewed appointments and meds to be refilled. Clinic obtained EKG and IV LABS I will see [place] one week.   Refills: Gabapentin   ACTION: Home visit completed

## 2021-03-24 ENCOUNTER — Other Ambulatory Visit (HOSPITAL_COMMUNITY): Payer: Self-pay | Admitting: Adult Health

## 2021-03-25 ENCOUNTER — Other Ambulatory Visit (HOSPITAL_COMMUNITY): Payer: Self-pay

## 2021-03-25 NOTE — Progress Notes (Signed)
Paramedicine Encounter    Patient ID: Rhonda [name], female    DOB: July 16, 1950, 70 y.o.   MRN: [id number]   Patient Care Team: Maurice Small, MD (Inactive) as PCP - General (Family Medicine) [name], LCSW as Social Worker (Licensed Clinical Social Worker) Alda Berthold, DO as Consulting Physician (Neurology)  Patient Active Problem List   Diagnosis Date Noted   Abnormal gait 06/11/2020   Adult failure to thrive syndrome 06/11/2020   Allergic rhinitis 06/11/2020   Anxiety 06/11/2020   Asthma 06/11/2020   Benign intracranial hypertension 06/11/2020   Body mass index (BMI) 45.0-49.9, adult ([place]) 06/11/2020   Bowel incontinence 06/11/2020   Cholelithiasis 06/11/2020   Chronic sinusitis 06/11/2020   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   Daytime somnolence 06/11/2020   Diarrhea of presumed infectious origin 06/11/2020   Edema 06/11/2020   Family history of malignant neoplasm of gastrointestinal tract 06/11/2020   Gout 06/11/2020   History of fall 06/11/2020   History of infectious disease 06/11/2020   Insomnia 06/11/2020   Irregular bowel habits 06/11/2020   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   Lumbar spondylosis with myelopathy 06/11/2020   Macrocytosis 06/11/2020   Mild recurrent major depression ([place]) 06/11/2020   Personal history of colonic polyps 06/11/2020   Personal history of malignant neoplasm of breast 06/11/2020   Recurrent falls 06/11/2020   Mixed anxiety and depressive disorder 06/11/2020   Irritable bowel syndrome 01/04/2020   Spinal stenosis of lumbar region 01/03/2020   Other symptoms and signs involving the musculoskeletal system 09/11/2019   Bilateral leg weakness 08/01/2019   Leg swelling 08/01/2019   Diabetes mellitus with neuropathy ([place]) 06/15/2019   Hypokalemia 11/08/2018   CKD (chronic kidney disease) stage 3, GFR 30-59 ml/min (HCC) 11/08/2018   Orthostatic hypotension 07/28/2017   SVD (spontaneous vaginal delivery)    Peripheral neuropathy    On home oxygen  therapy    Migraines    Left bundle branch block    Hypothyroidism    Hypertension    Hyperlipidemia    Heart murmur    GERD (gastroesophageal reflux disease)    Exertional shortness of breath    Major depressive disorder    Back pain    Arthritis    Generalized anxiety disorder    Anemia    Chronic respiratory failure ([place]) 09/14/2013   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   Morbid obesity ([place]) 12/75/1700   Chronic systolic heart failure ([place]) 10/27/2012   Endometrial polyp 01/20/2012   Malignant tumor of breast ([place]) 03/26/2011   Unspecified vitamin D deficiency 03/26/2011    Current Outpatient Medications:    acetaZOLAMIDE (DIAMOX) 125 MG tablet, Take 1 tablet (125 mg total) by mouth daily., Disp: 90 tablet, Rfl: 3   carvedilol (COREG) 3.125 MG tablet, TAKE 1 TABLET (3.125 MG) BY MOUTH TWICE DAILY WITH MEALS, Disp: 180 tablet, Rfl: 3   citalopram (CELEXA) 40 MG tablet, Take 40 mg by mouth daily., Disp: , Rfl:    clonazePAM (KLONOPIN) 0.5 MG tablet, , Disp: , Rfl:    dicyclomine (BENTYL) 20 MG tablet, Take 20 mg by mouth 3 (three) times daily before meals., Disp: , Rfl:    DULoxetine (CYMBALTA) 60 MG capsule, Take 60 mg by mouth daily., Disp: , Rfl:    fexofenadine (ALLEGRA) 180 MG tablet, Take 180 mg by mouth 2 (two) times daily., Disp: , Rfl:    fludrocortisone (FLORINEF) 0.1 MG tablet, Take 1 tablet (100 mcg total) by mouth 2 (two) times daily., Disp: 180  tablet, Rfl: 3   fluticasone (FLONASE) 50 MCG/ACT nasal spray, , Disp: , Rfl:    gabapentin (NEURONTIN) 300 MG capsule, Take 1 capsule by mouth 3 (three) times daily., Disp: , Rfl:    gabapentin (NEURONTIN) 800 MG tablet, TAKE 1 TABLET AT BEDTIME AS NEEDED FOR NEUROPATHY, Disp: 30 tablet, Rfl: 0   gabapentin (NEURONTIN) 800 MG tablet, Take 800 mg by mouth at bedtime., Disp: , Rfl:    Ginkgo Biloba 120 MG CAPS, See admin instructions., Disp: , Rfl:    ipratropium (ATROVENT) 0.03 %  nasal spray, Place 2 sprays into both nostrils 2 (two) times daily., Disp: , Rfl:    levothyroxine (SYNTHROID) 88 MCG tablet, TAKE 1 TABLET(88 MCG) BY MOUTH DAILY BEFORE BREAKFAST, Disp: 90 tablet, Rfl: 3   metolazone (ZAROXOLYN) 2.5 MG tablet, TAKE 1 TABLET BY MOUTH EVERY TUESDAY, Disp: 12 tablet, Rfl: 3   midodrine (PROAMATINE) 10 MG tablet, TAKE 1 TABLET (10 MG TOTAL) BY MOUTH 3 (THREE) TIMES DAILY WITH MEALS., Disp: 270 tablet, Rfl: 3   mirtazapine (REMERON) 30 MG tablet, 1 tablet at bedtime., Disp: , Rfl:    montelukast (SINGULAIR) 10 MG tablet, Take 10 mg by mouth at bedtime. , Disp: , Rfl: 1   Multiple Vitamin (MULTIVITAMIN) tablet, Take 1 tablet by mouth daily., Disp: , Rfl:    omeprazole (PRILOSEC) 40 MG capsule, Take 40 mg by mouth daily., Disp: , Rfl:    potassium chloride SA (KLOR-CON) 20 MEQ tablet, TAKE 2 TABLETS THREE TIMES DAILY. TAKE AN EXTRA 2 TABLETS ON DAYS YOU TAKE METOLAZONE, Disp: 572 tablet, Rfl: 0   probenecid (BENEMID) 500 MG tablet, Take 500 mg by mouth 2 (two) times daily., Disp: , Rfl:    simvastatin (ZOCOR) 10 MG tablet, Take 10 mg by mouth every evening., Disp: , Rfl:    spironolactone (ALDACTONE) 25 MG tablet, Take 1 tablet (25 mg total) by mouth every evening. Needs appt for further refills, Disp: 90 tablet, Rfl: 0   torsemide (DEMADEX) 20 MG tablet, Take 4 tablets (80 mg total) by mouth every morning AND 3 tablets (60 mg total) every evening., Disp: 630 tablet, Rfl: 3 Allergies  Allergen Reactions   Ceftin Anaphylaxis    Face and throat swell    Geodon [Ziprasidone Hcl] Hives   Lisinopril Other (See Comments)    angioedema   Shellfish Allergy Other (See Comments)    Gout exacerbation   Shellfish-Derived Products     Other reaction(s): Other   Ultram [Tramadol] Itching   Allopurinol Nausea Only and Other (See Comments)    weakness   Ativan [Lorazepam] Itching   Sulfa Antibiotics Itching   Ultram [Tramadol Hcl] Itching   Valium [Diazepam] Other (See  Comments)    Patient states that diazepam doesn't relax, it has the opposite effect.     Social History   Socioeconomic History   Marital status: Married    Spouse name: Not on file   Number of children: 2   Years of education: 54   Highest education level: Master's degree (e.g., MA, MS, MEng, MEd, MSW, MBA)  Occupational History   Occupation: retired  Tobacco Use   Smoking status: Some Days    Packs/day: 0.10    Years: 26.00    Pack years: 2.60    Types: Cigarettes    Last attempt to quit: 05/06/2007    Years since quitting: 13.8   Smokeless tobacco: Never  Vaping Use   Vaping Use: Never used  Substance and Sexual  Activity   Alcohol use: No   Drug use: No   Sexual activity: Yes  Other Topics Concern   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  Constitutional:      Appearance: Normal appearance. She is normal weight.  HENT:     Head: Normocephalic.     Nose: Nose normal.     Mouth/Throat:     Mouth: Mucous membranes are moist.     Pharynx: Oropharynx is clear.  Eyes:     Conjunctiva/sclera: Conjunctivae normal.     Pupils: Pupils are equal, round, and reactive to light.  Cardiovascular:     Rate and Rhythm: Normal rate and regular rhythm.     Pulses: Normal pulses.     Heart sounds: Normal heart sounds.  Pulmonary:     Effort: Pulmonary effort is  normal.     Breath sounds: Normal breath sounds.  Abdominal:     General: Abdomen is flat.     Palpations: Abdomen is soft.  Musculoskeletal:        General: No swelling. Normal range of motion.     Cervical back: Normal range of motion.     Right lower leg: No edema.     Left lower leg: No edema.  Skin:    General: Skin is warm and dry.     Capillary Refill: Capillary refill takes less than 2 seconds.  Neurological:     General: No focal deficit present.     Mental Status: She is alert. Mental status is at baseline.  Psychiatric:        Mood and Affect: Mood normal.   Arrived for Rhonda [name]'s home visit, she was alert and oriented walking around her kitchen with no shortness of breath noted. Rhonda [name] reports to be feeling good. I asked her if she has been urinating more since the increase in her medication and she reports no. She denied chest pain, dizziness or shortness of breath. No swelling or edema noted. Lungs clear. Vitals and assessment as noted. I reviewed medications and filled one week of medicines in her pill box. We reviewed upcoming appointments. No concerns expressed at this time. Home visit complete.   Refills: Gabapentin 300mg  Gabapentin 800mg  Omeprazole        Future Appointments  Date Time Provider [place]  03/31/2021  7:30 AM [facility] DEVICE REMOTES [facility]  04/10/2021  7:40 AM [facility] DEVICE REMOTES [facility]  04/23/2021 12:00 PM [facility] PA/NP [facility] None  06/25/2021  1:40 PM Bensimhon, Shaune Pascal, MD [facility] None  07/10/2021  7:40 AM [facility] DEVICE REMOTES [facility]  07/31/2021  1:00 PM Hazle Coca, PhD [facility] None  07/31/2021  2:00 PM [facility] NEUROPSYCH TECH [facility] None  08/07/2021 10:00 AM Hazle Coca, PhD [facility]  None  08/11/2021  7:40 AM [facility] DEVICE REMOTES [facility]  09/10/2021  7:40 AM [facility] DEVICE REMOTES [facility]  03/04/2022  8:50 AM Patel,  Arvin Collard K, DO [facility] None     ACTION: Home visit completed

## 2021-03-31 ENCOUNTER — Ambulatory Visit (INDEPENDENT_AMBULATORY_CARE_PROVIDER_SITE_OTHER): Payer: Medicare HMO

## 2021-03-31 DIAGNOSIS — I5022 Chronic systolic (congestive) heart failure: Secondary | ICD-10-CM | POA: Diagnosis not present

## 2021-03-31 DIAGNOSIS — Z95810 Presence of automatic (implantable) cardiac defibrillator: Secondary | ICD-10-CM

## 2021-04-01 ENCOUNTER — Other Ambulatory Visit (HOSPITAL_COMMUNITY): Payer: Self-pay

## 2021-04-01 NOTE — Progress Notes (Signed)
Paramedicine Encounter    Patient ID: Rhonda [name], female    DOB: July 16, 1950, 70 y.o.   MRN: [id number]   Patient Care Team: Maurice Small, MD (Inactive) as PCP - General (Family Medicine) [name], LCSW as Social Worker (Licensed Clinical Social Worker) Alda Berthold, DO as Consulting Physician (Neurology)  Patient Active Problem List   Diagnosis Date Noted   Abnormal gait 06/11/2020   Adult failure to thrive syndrome 06/11/2020   Allergic rhinitis 06/11/2020   Anxiety 06/11/2020   Asthma 06/11/2020   Benign intracranial hypertension 06/11/2020   Body mass index (BMI) 45.0-49.9, adult ([place]) 06/11/2020   Bowel incontinence 06/11/2020   Cholelithiasis 06/11/2020   Chronic sinusitis 06/11/2020   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   Daytime somnolence 06/11/2020   Diarrhea of presumed infectious origin 06/11/2020   Edema 06/11/2020   Family history of malignant neoplasm of gastrointestinal tract 06/11/2020   Gout 06/11/2020   History of fall 06/11/2020   History of infectious disease 06/11/2020   Insomnia 06/11/2020   Irregular bowel habits 06/11/2020   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   Lumbar spondylosis with myelopathy 06/11/2020   Macrocytosis 06/11/2020   Mild recurrent major depression ([place]) 06/11/2020   Personal history of colonic polyps 06/11/2020   Personal history of malignant neoplasm of breast 06/11/2020   Recurrent falls 06/11/2020   Mixed anxiety and depressive disorder 06/11/2020   Irritable bowel syndrome 01/04/2020   Spinal stenosis of lumbar region 01/03/2020   Other symptoms and signs involving the musculoskeletal system 09/11/2019   Bilateral leg weakness 08/01/2019   Leg swelling 08/01/2019   Diabetes mellitus with neuropathy ([place]) 06/15/2019   Hypokalemia 11/08/2018   CKD (chronic kidney disease) stage 3, GFR 30-59 ml/min (HCC) 11/08/2018   Orthostatic hypotension 07/28/2017   SVD (spontaneous vaginal delivery)    Peripheral neuropathy    On home oxygen  therapy    Migraines    Left bundle branch block    Hypothyroidism    Hypertension    Hyperlipidemia    Heart murmur    GERD (gastroesophageal reflux disease)    Exertional shortness of breath    Major depressive disorder    Back pain    Arthritis    Generalized anxiety disorder    Anemia    Chronic respiratory failure ([place]) 09/14/2013   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   Morbid obesity ([place]) 12/75/1700   Chronic systolic heart failure ([place]) 10/27/2012   Endometrial polyp 01/20/2012   Malignant tumor of breast ([place]) 03/26/2011   Unspecified vitamin D deficiency 03/26/2011    Current Outpatient Medications:    acetaZOLAMIDE (DIAMOX) 125 MG tablet, Take 1 tablet (125 mg total) by mouth daily., Disp: 90 tablet, Rfl: 3   carvedilol (COREG) 3.125 MG tablet, TAKE 1 TABLET (3.125 MG) BY MOUTH TWICE DAILY WITH MEALS, Disp: 180 tablet, Rfl: 3   citalopram (CELEXA) 40 MG tablet, Take 40 mg by mouth daily., Disp: , Rfl:    clonazePAM (KLONOPIN) 0.5 MG tablet, , Disp: , Rfl:    dicyclomine (BENTYL) 20 MG tablet, Take 20 mg by mouth 3 (three) times daily before meals., Disp: , Rfl:    DULoxetine (CYMBALTA) 60 MG capsule, Take 60 mg by mouth daily., Disp: , Rfl:    fexofenadine (ALLEGRA) 180 MG tablet, Take 180 mg by mouth 2 (two) times daily., Disp: , Rfl:    fludrocortisone (FLORINEF) 0.1 MG tablet, Take 1 tablet (100 mcg total) by mouth 2 (two) times daily., Disp: 180  tablet, Rfl: 3   fluticasone (FLONASE) 50 MCG/ACT nasal spray, , Disp: , Rfl:    gabapentin (NEURONTIN) 300 MG capsule, Take 1 capsule by mouth 3 (three) times daily., Disp: , Rfl:    gabapentin (NEURONTIN) 800 MG tablet, TAKE 1 TABLET AT BEDTIME AS NEEDED FOR NEUROPATHY, Disp: 30 tablet, Rfl: 0   gabapentin (NEURONTIN) 800 MG tablet, Take 800 mg by mouth at bedtime., Disp: , Rfl:    Ginkgo Biloba 120 MG CAPS, See admin instructions., Disp: , Rfl:    ipratropium (ATROVENT) 0.03 %  nasal spray, Place 2 sprays into both nostrils 2 (two) times daily., Disp: , Rfl:    levothyroxine (SYNTHROID) 88 MCG tablet, TAKE 1 TABLET(88 MCG) BY MOUTH DAILY BEFORE BREAKFAST, Disp: 90 tablet, Rfl: 3   metolazone (ZAROXOLYN) 2.5 MG tablet, TAKE 1 TABLET BY MOUTH EVERY TUESDAY, Disp: 12 tablet, Rfl: 3   midodrine (PROAMATINE) 10 MG tablet, TAKE 1 TABLET (10 MG TOTAL) BY MOUTH 3 (THREE) TIMES DAILY WITH MEALS., Disp: 270 tablet, Rfl: 3   mirtazapine (REMERON) 30 MG tablet, 1 tablet at bedtime., Disp: , Rfl:    montelukast (SINGULAIR) 10 MG tablet, Take 10 mg by mouth at bedtime. , Disp: , Rfl: 1   Multiple Vitamin (MULTIVITAMIN) tablet, Take 1 tablet by mouth daily., Disp: , Rfl:    omeprazole (PRILOSEC) 40 MG capsule, Take 40 mg by mouth daily., Disp: , Rfl:    potassium chloride SA (KLOR-CON) 20 MEQ tablet, TAKE 2 TABLETS THREE TIMES DAILY. TAKE AN EXTRA 2 TABLETS ON DAYS YOU TAKE METOLAZONE, Disp: 572 tablet, Rfl: 0   probenecid (BENEMID) 500 MG tablet, Take 500 mg by mouth 2 (two) times daily., Disp: , Rfl:    simvastatin (ZOCOR) 10 MG tablet, Take 10 mg by mouth every evening., Disp: , Rfl:    spironolactone (ALDACTONE) 25 MG tablet, Take 1 tablet (25 mg total) by mouth every evening. Needs appt for further refills, Disp: 90 tablet, Rfl: 0   torsemide (DEMADEX) 20 MG tablet, Take 4 tablets (80 mg total) by mouth every morning AND 3 tablets (60 mg total) every evening., Disp: 630 tablet, Rfl: 3 Allergies  Allergen Reactions   Ceftin Anaphylaxis    Face and throat swell    Geodon [Ziprasidone Hcl] Hives   Lisinopril Other (See Comments)    angioedema   Shellfish Allergy Other (See Comments)    Gout exacerbation   Shellfish-Derived Products     Other reaction(s): Other   Ultram [Tramadol] Itching   Allopurinol Nausea Only and Other (See Comments)    weakness   Ativan [Lorazepam] Itching   Sulfa Antibiotics Itching   Ultram [Tramadol Hcl] Itching   Valium [Diazepam] Other (See  Comments)    Patient states that diazepam doesn't relax, it has the opposite effect.     Social History   Socioeconomic History   Marital status: Married    Spouse name: Not on file   Number of children: 2   Years of education: 49   Highest education level: Master's degree (e.g., MA, MS, MEng, MEd, MSW, MBA)  Occupational History   Occupation: retired  Tobacco Use   Smoking status: Some Days    Packs/day: 0.10    Years: 26.00    Pack years: 2.60    Types: Cigarettes    Last attempt to quit: 05/06/2007    Years since quitting: 13.9   Smokeless tobacco: Never  Vaping Use   Vaping Use: Never used  Substance and Sexual  Activity   Alcohol use: No   Drug use: No   Sexual activity: Yes  Other Topics Concern   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  Constitutional:      Appearance: Normal appearance. She is normal weight.  HENT:     Head: Normocephalic.     Nose: Nose normal.     Mouth/Throat:     Mouth: Mucous membranes are moist.     Pharynx: Oropharynx is clear.  Eyes:     Conjunctiva/sclera: Conjunctivae normal.     Pupils: Pupils are equal, round, and reactive to light.  Cardiovascular:     Rate and Rhythm: Normal rate and regular rhythm.     Pulses: Normal pulses.     Heart sounds: Normal heart sounds.  Pulmonary:     Effort: Pulmonary effort is  normal.     Breath sounds: Normal breath sounds.  Abdominal:     General: Abdomen is flat.     Palpations: Abdomen is soft.  Musculoskeletal:        General: No swelling. Normal range of motion.     Cervical back: Normal range of motion.     Right lower leg: No edema.     Left lower leg: No edema.  Skin:    General: Skin is warm and dry.     Capillary Refill: Capillary refill takes less than 2 seconds.  Neurological:     General: No focal deficit present.     Mental Status: She is alert. Mental status is at baseline.  Psychiatric:        Mood and Affect: Mood normal.    Arrived for home visit for [facility] who reports feeling good with no chest pain, dizziness. Kaydynce has been compliant with all medications over the last week. I obtained vitals and assessment. No swelling or edema noted. Lung sounds clear. Mumtaz gained 5 lbs in a week. She reports she has not been eating much so she isn't sure how she gained the weight. She advised she has had a head cold and didn't go to PREP class this week. She will resume next week. I reviewed medications and filled pill box accordingly. Appointments reviewed. Home visit complete. I will see Laqu[place] in one week.   Refills: NONE      Future Appointments  Date Time Provider [place]  04/02/2021  2:00 PM Candyce Churn B, PT [facility] None  04/10/2021  7:40 AM [facility] DEVICE REMOTES [facility]  04/23/2021 12:00 PM [facility] PA/NP [facility] None  06/25/2021  1:40 PM Bensimhon, Shaune Pascal, MD [facility] None  07/10/2021  7:40 AM [facility] DEVICE REMOTES [facility]  07/31/2021  1:00 PM Hazle Coca, PhD [facility] None  07/31/2021  2:00 PM L[facility]- NEUROPSYCH TECH [facility] None  08/07/2021 10:00 AM Hazle Coca, PhD [facility] None  08/11/2021  7:40 AM [facility] DEVICE REMOTES [facility]  09/10/2021  7:40 AM [facility] DEVICE REMOTES [facility]  03/04/2022  8:50 AM Patel, Arvin Collard K, DO [facility]  None     ACTION: Home visit completed

## 2021-04-02 ENCOUNTER — Ambulatory Visit: Payer: Medicare HMO | Attending: Family Medicine

## 2021-04-02 ENCOUNTER — Telehealth: Payer: Self-pay

## 2021-04-02 NOTE — Telephone Encounter (Signed)
Call placed to patient due to no show.  No answer, left voice mail for patient to call our office.  Did not leave reason for call as this was her home phone number and did not speak to her directly.

## 2021-04-03 DIAGNOSIS — J988 Other specified respiratory disorders: Secondary | ICD-10-CM | POA: Diagnosis not present

## 2021-04-03 DIAGNOSIS — R059 Cough, unspecified: Secondary | ICD-10-CM | POA: Diagnosis not present

## 2021-04-04 NOTE — Progress Notes (Signed)
EPIC Encounter for ICM Monitoring  Patient Name: Rhonda [name] is a 70 y.o. female Date: 04/04/2021 Primary Care Physican: Maurice Small, MD (Inactive) Primary Cardiologist: [place] Electrophysiologist: Santina Evans Pacing: 98.6%   03/19/2021  Weight: 234 lbs      Transmission reviewed.    Optivol thoracic impedance suggesting normal fluid levels.    Prescribed:  Patient participating in paramedicine program to assist with meds. Torsemide 20 mg 3 tablets (60 mg total) by mouth twice a day.   Potassium  20 meq 2 tablets (40 mEq total) by mouth 3 (three) times daily.  Take extra 2 tablets on days you take Metolazone. Metolazone 2.5mg  1 tablet every Tuesday. Spironolactone 25 mg take 1 tablet by mouth every evening.   Labs: 03/19/2021 Creatinine 1.58, BUN 29, Potassium 3.9, Sodium 138, GFR 35, BNP 68.6 A complete set of results can be found in Results Review.   Recommendations:  No changes   Follow-up plan: ICM clinic phone appointment on 05/07/2021.   91 day device clinic remote transmission 04/10/2021.     EP/Cardiology Office Visits:  Recall 09/10/2020 with Dr Haroldine Laws.  Recall 07/26/2021 with Dr Lovena Le.     Copy of ICM check sent to Dr. Lovena Le.      3 month ICM trend: 03/31/2021.    1 Year ICM trend:       [name], RN 04/04/2021 11:07 AM

## 2021-04-08 ENCOUNTER — Other Ambulatory Visit (HOSPITAL_COMMUNITY): Payer: Self-pay

## 2021-04-08 NOTE — Progress Notes (Signed)
Paramedicine Encounter    Patient ID: Rhonda [name], female    DOB: July 16, 1950, 70 y.o.   MRN: [id number]   Patient Care Team: Maurice Small, MD (Inactive) as PCP - General (Family Medicine) [name], LCSW as Social Worker (Licensed Clinical Social Worker) Alda Berthold, DO as Consulting Physician (Neurology)  Patient Active Problem List   Diagnosis Date Noted   Abnormal gait 06/11/2020   Adult failure to thrive syndrome 06/11/2020   Allergic rhinitis 06/11/2020   Anxiety 06/11/2020   Asthma 06/11/2020   Benign intracranial hypertension 06/11/2020   Body mass index (BMI) 45.0-49.9, adult ([place]) 06/11/2020   Bowel incontinence 06/11/2020   Cholelithiasis 06/11/2020   Chronic sinusitis 06/11/2020   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   Daytime somnolence 06/11/2020   Diarrhea of presumed infectious origin 06/11/2020   Edema 06/11/2020   Family history of malignant neoplasm of gastrointestinal tract 06/11/2020   Gout 06/11/2020   History of fall 06/11/2020   History of infectious disease 06/11/2020   Insomnia 06/11/2020   Irregular bowel habits 06/11/2020   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   Lumbar spondylosis with myelopathy 06/11/2020   Macrocytosis 06/11/2020   Mild recurrent major depression ([place]) 06/11/2020   Personal history of colonic polyps 06/11/2020   Personal history of malignant neoplasm of breast 06/11/2020   Recurrent falls 06/11/2020   Mixed anxiety and depressive disorder 06/11/2020   Irritable bowel syndrome 01/04/2020   Spinal stenosis of lumbar region 01/03/2020   Other symptoms and signs involving the musculoskeletal system 09/11/2019   Bilateral leg weakness 08/01/2019   Leg swelling 08/01/2019   Diabetes mellitus with neuropathy ([place]) 06/15/2019   Hypokalemia 11/08/2018   CKD (chronic kidney disease) stage 3, GFR 30-59 ml/min (HCC) 11/08/2018   Orthostatic hypotension 07/28/2017   SVD (spontaneous vaginal delivery)    Peripheral neuropathy    On home oxygen  therapy    Migraines    Left bundle branch block    Hypothyroidism    Hypertension    Hyperlipidemia    Heart murmur    GERD (gastroesophageal reflux disease)    Exertional shortness of breath    Major depressive disorder    Back pain    Arthritis    Generalized anxiety disorder    Anemia    Chronic respiratory failure ([place]) 09/14/2013   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   Morbid obesity ([place]) 12/75/1700   Chronic systolic heart failure ([place]) 10/27/2012   Endometrial polyp 01/20/2012   Malignant tumor of breast ([place]) 03/26/2011   Unspecified vitamin D deficiency 03/26/2011    Current Outpatient Medications:    acetaZOLAMIDE (DIAMOX) 125 MG tablet, Take 1 tablet (125 mg total) by mouth daily., Disp: 90 tablet, Rfl: 3   carvedilol (COREG) 3.125 MG tablet, TAKE 1 TABLET (3.125 MG) BY MOUTH TWICE DAILY WITH MEALS, Disp: 180 tablet, Rfl: 3   citalopram (CELEXA) 40 MG tablet, Take 40 mg by mouth daily., Disp: , Rfl:    clonazePAM (KLONOPIN) 0.5 MG tablet, , Disp: , Rfl:    dicyclomine (BENTYL) 20 MG tablet, Take 20 mg by mouth 3 (three) times daily before meals., Disp: , Rfl:    DULoxetine (CYMBALTA) 60 MG capsule, Take 60 mg by mouth daily., Disp: , Rfl:    fexofenadine (ALLEGRA) 180 MG tablet, Take 180 mg by mouth 2 (two) times daily., Disp: , Rfl:    fludrocortisone (FLORINEF) 0.1 MG tablet, Take 1 tablet (100 mcg total) by mouth 2 (two) times daily., Disp: 180  tablet, Rfl: 3   fluticasone (FLONASE) 50 MCG/ACT nasal spray, , Disp: , Rfl:    gabapentin (NEURONTIN) 300 MG capsule, Take 1 capsule by mouth 3 (three) times daily., Disp: , Rfl:    gabapentin (NEURONTIN) 800 MG tablet, TAKE 1 TABLET AT BEDTIME AS NEEDED FOR NEUROPATHY, Disp: 30 tablet, Rfl: 0   gabapentin (NEURONTIN) 800 MG tablet, Take 800 mg by mouth at bedtime., Disp: , Rfl:    Ginkgo Biloba 120 MG CAPS, See admin instructions., Disp: , Rfl:    ipratropium (ATROVENT) 0.03 %  nasal spray, Place 2 sprays into both nostrils 2 (two) times daily., Disp: , Rfl:    levothyroxine (SYNTHROID) 88 MCG tablet, TAKE 1 TABLET(88 MCG) BY MOUTH DAILY BEFORE BREAKFAST, Disp: 90 tablet, Rfl: 3   metolazone (ZAROXOLYN) 2.5 MG tablet, TAKE 1 TABLET BY MOUTH EVERY TUESDAY, Disp: 12 tablet, Rfl: 3   midodrine (PROAMATINE) 10 MG tablet, TAKE 1 TABLET (10 MG TOTAL) BY MOUTH 3 (THREE) TIMES DAILY WITH MEALS., Disp: 270 tablet, Rfl: 3   mirtazapine (REMERON) 30 MG tablet, 1 tablet at bedtime., Disp: , Rfl:    montelukast (SINGULAIR) 10 MG tablet, Take 10 mg by mouth at bedtime.  (Patient not taking: Reported on 04/01/2021), Disp: , Rfl: 1   Multiple Vitamin (MULTIVITAMIN) tablet, Take 1 tablet by mouth daily., Disp: , Rfl:    omeprazole (PRILOSEC) 40 MG capsule, Take 40 mg by mouth daily., Disp: , Rfl:    potassium chloride SA (KLOR-CON) 20 MEQ tablet, TAKE 2 TABLETS THREE TIMES DAILY. TAKE AN EXTRA 2 TABLETS ON DAYS YOU TAKE METOLAZONE, Disp: 572 tablet, Rfl: 0   probenecid (BENEMID) 500 MG tablet, Take 500 mg by mouth 2 (two) times daily., Disp: , Rfl:    simvastatin (ZOCOR) 10 MG tablet, Take 10 mg by mouth every evening., Disp: , Rfl:    spironolactone (ALDACTONE) 25 MG tablet, Take 1 tablet (25 mg total) by mouth every evening. Needs appt for further refills, Disp: 90 tablet, Rfl: 0   torsemide (DEMADEX) 20 MG tablet, Take 4 tablets (80 mg total) by mouth every morning AND 3 tablets (60 mg total) every evening., Disp: 630 tablet, Rfl: 3 Allergies  Allergen Reactions   Ceftin Anaphylaxis    Face and throat swell    Geodon [Ziprasidone Hcl] Hives   Lisinopril Other (See Comments)    angioedema   Shellfish Allergy Other (See Comments)    Gout exacerbation   Shellfish-Derived Products     Other reaction(s): Other   Ultram [Tramadol] Itching   Allopurinol Nausea Only and Other (See Comments)    weakness   Ativan [Lorazepam] Itching   Sulfa Antibiotics Itching   Ultram [Tramadol Hcl]  Itching   Valium [Diazepam] Other (See Comments)    Patient states that diazepam doesn't relax, it has the opposite effect.     Social History   Socioeconomic History   Marital status: Married    Spouse name: Not on file   Number of children: 2   Years of education: 87   Highest education level: Master's degree (e.g., MA, MS, MEng, MEd, MSW, MBA)  Occupational History   Occupation: retired  Tobacco Use   Smoking status: Some Days    Packs/day: 0.10    Years: 26.00    Pack years: 2.60    Types: Cigarettes    Last attempt to quit: 05/06/2007    Years since quitting: 13.9   Smokeless tobacco: Never  Vaping Use   Vaping Use:  Never used  Substance and Sexual Activity   Alcohol use: No   Drug use: No   Sexual activity: Yes  Other Topics Concern   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  Constitutional:      Appearance: She is normal weight.  HENT:     Head: Normocephalic.     Nose: Congestion and rhinorrhea present.     Mouth/Throat:     Mouth: Mucous membranes are moist.     Pharynx: Oropharynx is clear.  Eyes:     Conjunctiva/sclera: Conjunctivae normal.     Pupils: Pupils are equal, round, and reactive to light.  Cardiovascular:     Rate and Rhythm: Normal rate and regular rhythm.     Pulses: Normal pulses.     Heart sounds: Normal heart sounds.   Pulmonary:     Effort: Pulmonary effort is normal.     Breath sounds: Rhonchi present.  Abdominal:     General: Abdomen is flat.     Palpations: Abdomen is soft.  Musculoskeletal:        General: No swelling. Normal range of motion.     Cervical back: Normal range of motion.     Right lower leg: No edema.     Left lower leg: No edema.  Skin:    General: Skin is warm and dry.     Capillary Refill: Capillary refill takes less than 2 seconds.  Neurological:     General: No focal deficit present.     Mental Status: She is alert. Mental status is at baseline.  Psychiatric:        Mood and Affect: Mood normal.    Arrived for home visit for Alahni who reports being sick X5 days with URI symptoms. No fever. She was given 10 days of Amox-Clav and Mucinex D. Vitals were obtained and lung sounds noted to have some congestion. She reports some shortness of breath with exertion. No swelling noted. No weight gain in the last week. Medications were reviewed. She missed three noon doses last week. I confirmed medications and filled pill box accordingly. We reviewed upcoming appointments and confirmed I would be out in one week. Home visit complete.   Refills: Bentyl       Future Appointments  Date Time Provider [place]  04/10/2021  7:40 AM [facility] DEVICE REMOTES [facility]  04/23/2021 12:00 PM [facility] PA/NP [facility] None  04/30/2021  2:45 PM Candyce Churn B, PT [facility] None  06/25/2021  1:40 PM Bensimhon, Shaune Pascal, MD [facility] None  07/10/2021  7:40 AM [facility] DEVICE REMOTES [facility]  07/31/2021  1:00 PM Hazle Coca, PhD [facility] None  07/31/2021  2:00 PM [facility]- NEUROPSYCH TECH [facility] None  08/07/2021 10:00 AM Hazle Coca, PhD [facility]  None  08/11/2021  7:40 AM [facility] DEVICE REMOTES [facility]  09/10/2021  7:40 AM [facility] DEVICE REMOTES [facility]  03/04/2022  8:50 AM Patel, Arvin Collard K, DO [facility] None      ACTION: Home visit completed

## 2021-04-10 ENCOUNTER — Telehealth: Payer: Self-pay

## 2021-04-10 ENCOUNTER — Ambulatory Visit (INDEPENDENT_AMBULATORY_CARE_PROVIDER_SITE_OTHER): Payer: Medicare HMO

## 2021-04-10 DIAGNOSIS — I428 Other cardiomyopathies: Secondary | ICD-10-CM | POA: Diagnosis not present

## 2021-04-10 LAB — CUP PACEART REMOTE DEVICE CHECK
Battery Remaining Longevity: 85 mo
Battery Voltage: 3 V
Brady Statistic AP VP Percent: 0.04 %
Brady Statistic AP VS Percent: 0.01 %
Brady Statistic AS VP Percent: 98.8 %
Brady Statistic AS VS Percent: 1.15 %
Brady Statistic RA Percent Paced: 0.04 %
Brady Statistic RV Percent Paced: 78.02 %
Date Time Interrogation Session: 20221117001802
HighPow Impedance: 71 Ohm
Implantable Lead Implant Date: 20141210
Implantable Lead Implant Date: 20141210
Implantable Lead Implant Date: 20141210
Implantable Lead Location: 753858
Implantable Lead Location: 753859
Implantable Lead Location: 753860
Implantable Lead Model: 4396
Implantable Lead Model: 5076
Implantable Lead Model: 6935
Implantable Pulse Generator Implant Date: 20211118
Lead Channel Impedance Value: 342 Ohm
Lead Channel Impedance Value: 418 Ohm
Lead Channel Impedance Value: 456 Ohm
Lead Channel Impedance Value: 532 Ohm
Lead Channel Impedance Value: 608 Ohm
Lead Channel Impedance Value: 665 Ohm
Lead Channel Pacing Threshold Amplitude: 0.5 V
Lead Channel Pacing Threshold Amplitude: 0.75 V
Lead Channel Pacing Threshold Amplitude: 1.125 V
Lead Channel Pacing Threshold Pulse Width: 0.4 ms
Lead Channel Pacing Threshold Pulse Width: 0.4 ms
Lead Channel Pacing Threshold Pulse Width: 0.4 ms
Lead Channel Sensing Intrinsic Amplitude: 21.625 mV
Lead Channel Sensing Intrinsic Amplitude: 21.625 mV
Lead Channel Sensing Intrinsic Amplitude: 3.625 mV
Lead Channel Sensing Intrinsic Amplitude: 3.625 mV
Lead Channel Setting Pacing Amplitude: 1.5 V
Lead Channel Setting Pacing Amplitude: 2 V
Lead Channel Setting Pacing Amplitude: 2.5 V
Lead Channel Setting Pacing Pulse Width: 0.4 ms
Lead Channel Setting Pacing Pulse Width: 0.4 ms
Lead Channel Setting Sensing Sensitivity: 0.3 mV

## 2021-04-10 NOTE — Telephone Encounter (Signed)
Vmf pt. Still not feeling well, congestion and SOB Sts will be following up with Md tomorrow VMT pt: LM encouraging follow up and to get well.  F/U with ER if the SOB becomes bad.

## 2021-04-15 ENCOUNTER — Other Ambulatory Visit (HOSPITAL_COMMUNITY): Payer: Self-pay

## 2021-04-15 DIAGNOSIS — E785 Hyperlipidemia, unspecified: Secondary | ICD-10-CM | POA: Diagnosis not present

## 2021-04-15 DIAGNOSIS — N183 Chronic kidney disease, stage 3 unspecified: Secondary | ICD-10-CM | POA: Diagnosis not present

## 2021-04-15 DIAGNOSIS — J45909 Unspecified asthma, uncomplicated: Secondary | ICD-10-CM | POA: Diagnosis not present

## 2021-04-15 DIAGNOSIS — F330 Major depressive disorder, recurrent, mild: Secondary | ICD-10-CM | POA: Diagnosis not present

## 2021-04-15 DIAGNOSIS — G4700 Insomnia, unspecified: Secondary | ICD-10-CM | POA: Diagnosis not present

## 2021-04-15 DIAGNOSIS — I5022 Chronic systolic (congestive) heart failure: Secondary | ICD-10-CM | POA: Diagnosis not present

## 2021-04-15 DIAGNOSIS — I10 Essential (primary) hypertension: Secondary | ICD-10-CM | POA: Diagnosis not present

## 2021-04-15 DIAGNOSIS — E039 Hypothyroidism, unspecified: Secondary | ICD-10-CM | POA: Diagnosis not present

## 2021-04-15 DIAGNOSIS — E1140 Type 2 diabetes mellitus with diabetic neuropathy, unspecified: Secondary | ICD-10-CM | POA: Diagnosis not present

## 2021-04-15 NOTE — Progress Notes (Signed)
Paramedicine Encounter    Patient ID: Rhonda [name], female    DOB: 1951-03-21, 70 y.o.   MRN: [id number]   Patient Care Team: Maurice Small, MD (Inactive) as PCP - General (Family Medicine) [name], LCSW as Social Worker (Licensed Clinical Social Worker) Alda Berthold, DO as Consulting Physician (Neurology)  Patient Active Problem List   Diagnosis Date Noted   Abnormal gait 06/11/2020   Adult failure to thrive syndrome 06/11/2020   Allergic rhinitis 06/11/2020   Anxiety 06/11/2020   Asthma 06/11/2020   Benign intracranial hypertension 06/11/2020   Body mass index (BMI) 45.0-49.9, adult ([place]) 06/11/2020   Bowel incontinence 06/11/2020   Cholelithiasis 06/11/2020   Chronic sinusitis 06/11/2020   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   Daytime somnolence 06/11/2020   Diarrhea of presumed infectious origin 06/11/2020   Edema 06/11/2020   Family history of malignant neoplasm of gastrointestinal tract 06/11/2020   Gout 06/11/2020   History of fall 06/11/2020   History of infectious disease 06/11/2020   Insomnia 06/11/2020   Irregular bowel habits 06/11/2020   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   Lumbar spondylosis with myelopathy 06/11/2020   Macrocytosis 06/11/2020   Mild recurrent major depression ([place]) 06/11/2020   Personal history of colonic polyps 06/11/2020   Personal history of malignant neoplasm of breast 06/11/2020   Recurrent falls 06/11/2020   Mixed anxiety and depressive disorder 06/11/2020   Irritable bowel syndrome 01/04/2020   Spinal stenosis of lumbar region 01/03/2020   Other symptoms and signs involving the musculoskeletal system 09/11/2019   Bilateral leg weakness 08/01/2019   Leg swelling 08/01/2019   Diabetes mellitus with neuropathy ([place]) 06/15/2019   Hypokalemia 11/08/2018   CKD (chronic kidney disease) stage 3, GFR 30-59 ml/min (HCC) 11/08/2018   Orthostatic hypotension 07/28/2017   SVD (spontaneous vaginal delivery)    Peripheral neuropathy    On home oxygen  therapy    Migraines    Left bundle branch block    Hypothyroidism    Hypertension    Hyperlipidemia    Heart murmur    GERD (gastroesophageal reflux disease)    Exertional shortness of breath    Major depressive disorder    Back pain    Arthritis    Generalized anxiety disorder    Anemia    Chronic respiratory failure ([place]) 09/14/2013   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   Morbid obesity ([place]) 76/16/0737   Chronic systolic heart failure ([place]) 10/27/2012   Endometrial polyp 01/20/2012   Malignant tumor of breast ([place]) 03/26/2011   Unspecified vitamin D deficiency 03/26/2011    Current Outpatient Medications:    acetaZOLAMIDE (DIAMOX) 125 MG tablet, Take 1 tablet (125 mg total) by mouth daily., Disp: 90 tablet, Rfl: 3   carvedilol (COREG) 3.125 MG tablet, TAKE 1 TABLET (3.125 MG) BY MOUTH TWICE DAILY WITH MEALS, Disp: 180 tablet, Rfl: 3   citalopram (CELEXA) 40 MG tablet, Take 40 mg by mouth daily., Disp: , Rfl:    clonazePAM (KLONOPIN) 0.5 MG tablet, , Disp: , Rfl:    dicyclomine (BENTYL) 20 MG tablet, Take 20 mg by mouth 3 (three) times daily before meals., Disp: , Rfl:    DULoxetine (CYMBALTA) 60 MG capsule, Take 60 mg by mouth daily., Disp: , Rfl:    fexofenadine (ALLEGRA) 180 MG tablet, Take 180 mg by mouth 2 (two) times daily. (Patient not taking: Reported on 04/08/2021), Disp: , Rfl:    fludrocortisone (FLORINEF) 0.1 MG tablet, Take 1 tablet (100 mcg total) by mouth  2 (two) times daily., Disp: 180 tablet, Rfl: 3   fluticasone (FLONASE) 50 MCG/ACT nasal spray, , Disp: , Rfl:    gabapentin (NEURONTIN) 300 MG capsule, Take 1 capsule by mouth 3 (three) times daily., Disp: , Rfl:    gabapentin (NEURONTIN) 800 MG tablet, TAKE 1 TABLET AT BEDTIME AS NEEDED FOR NEUROPATHY, Disp: 30 tablet, Rfl: 0   gabapentin (NEURONTIN) 800 MG tablet, Take 800 mg by mouth at bedtime., Disp: , Rfl:    Ginkgo Biloba 120 MG CAPS, See admin instructions.  (Patient not taking: Reported on 04/08/2021), Disp: , Rfl:    ipratropium (ATROVENT) 0.03 % nasal spray, Place 2 sprays into both nostrils 2 (two) times daily., Disp: , Rfl:    levothyroxine (SYNTHROID) 88 MCG tablet, TAKE 1 TABLET(88 MCG) BY MOUTH DAILY BEFORE BREAKFAST, Disp: 90 tablet, Rfl: 3   metolazone (ZAROXOLYN) 2.5 MG tablet, TAKE 1 TABLET BY MOUTH EVERY TUESDAY, Disp: 12 tablet, Rfl: 3   midodrine (PROAMATINE) 10 MG tablet, TAKE 1 TABLET (10 MG TOTAL) BY MOUTH 3 (THREE) TIMES DAILY WITH MEALS., Disp: 270 tablet, Rfl: 3   mirtazapine (REMERON) 30 MG tablet, 1 tablet at bedtime., Disp: , Rfl:    montelukast (SINGULAIR) 10 MG tablet, Take 10 mg by mouth at bedtime.  (Patient not taking: No sig reported), Disp: , Rfl: 1   Multiple Vitamin (MULTIVITAMIN) tablet, Take 1 tablet by mouth daily., Disp: , Rfl:    omeprazole (PRILOSEC) 40 MG capsule, Take 40 mg by mouth daily., Disp: , Rfl:    potassium chloride SA (KLOR-CON) 20 MEQ tablet, TAKE 2 TABLETS THREE TIMES DAILY. TAKE AN EXTRA 2 TABLETS ON DAYS YOU TAKE METOLAZONE, Disp: 572 tablet, Rfl: 0   probenecid (BENEMID) 500 MG tablet, Take 500 mg by mouth 2 (two) times daily., Disp: , Rfl:    simvastatin (ZOCOR) 10 MG tablet, Take 10 mg by mouth every evening., Disp: , Rfl:    spironolactone (ALDACTONE) 25 MG tablet, Take 1 tablet (25 mg total) by mouth every evening. Needs appt for further refills, Disp: 90 tablet, Rfl: 0   torsemide (DEMADEX) 20 MG tablet, Take 4 tablets (80 mg total) by mouth every morning AND 3 tablets (60 mg total) every evening., Disp: 630 tablet, Rfl: 3 Allergies  Allergen Reactions   Ceftin Anaphylaxis    Face and throat swell    Geodon [Ziprasidone Hcl] Hives   Lisinopril Other (See Comments)    angioedema   Shellfish Allergy Other (See Comments)    Gout exacerbation   Shellfish-Derived Products     Other reaction(s): Other   Ultram [Tramadol] Itching   Allopurinol Nausea Only and Other (See Comments)     weakness   Ativan [Lorazepam] Itching   Sulfa Antibiotics Itching   Ultram [Tramadol Hcl] Itching   Valium [Diazepam] Other (See Comments)    Patient states that diazepam doesn't relax, it has the opposite effect.     Social History   Socioeconomic History   Marital status: Married    Spouse name: Not on file   Number of children: 2   Years of education: 69   Highest education level: Master's degree (e.g., MA, MS, MEng, MEd, MSW, MBA)  Occupational History   Occupation: retired  Tobacco Use   Smoking status: Some Days    Packs/day: 0.10    Years: 26.00    Pack years: 2.60    Types: Cigarettes    Last attempt to quit: 05/06/2007    Years since quitting: 13.9  Smokeless tobacco: Never  Vaping Use   Vaping Use: Never used  Substance and Sexual Activity   Alcohol use: No   Drug use: No   Sexual activity: Yes  Other Topics Concern   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  Constitutional:      Appearance: Normal appearance. She is normal weight.  HENT:     Head: Normocephalic.     Nose: Nose normal.     Mouth/Throat:     Mouth: Mucous membranes are moist.     Pharynx: Oropharynx is clear.  Eyes:     Conjunctiva/sclera: Conjunctivae normal.     Pupils: Pupils are equal, round, and reactive to light.  Cardiovascular:     Rate and Rhythm: Normal rate and  regular rhythm.     Pulses: Normal pulses.     Heart sounds: Normal heart sounds.  Pulmonary:     Effort: Pulmonary effort is normal.     Breath sounds: Normal breath sounds.  Abdominal:     General: Abdomen is flat.     Palpations: Abdomen is soft.  Musculoskeletal:        General: No swelling. Normal range of motion.     Cervical back: Normal range of motion.     Right lower leg: No edema.     Left lower leg: No edema.  Skin:    General: Skin is warm and dry.     Capillary Refill: Capillary refill takes less than 2 seconds.  Neurological:     General: No focal deficit present.     Mental Status: She is alert. Mental status is at baseline.  Psychiatric:        Mood and Affect: Mood normal.    Arrived for home visit for Rhonda [name] who reports feeling good stating she is feeling much better this week. Rhonda [name] reports that she has been eating better. Rhonda [name] did report she has been short of breath when walking around the grocery store and gets tired easily over the last several weeks. Rhonda [name] has been med compliant. Vitals and assessment obtained. No swelling noted, no JVD, lung sounds clear. Rhonda [name] completed Mucinex D and antibiotic. Medications were confirmed and pill box filled accordingly. We reviewed appointments. Home visit complete I will see Rhonda [name] in one week.   Refills -Cymbalta -Levothyroxine      Future Appointments  Date Time Provider [place]  04/23/2021 12:00 PM [facility] PA/NP [facility] None  04/30/2021  2:45 PM Candyce Churn B, PT [facility] None  06/25/2021  1:40 PM Bensimhon, Shaune Pascal, MD [facility] None  07/10/2021  7:40 AM [facility] DEVICE REMOTES [facility]  07/31/2021  1:00 PM Hazle Coca, PhD [facility] None  07/31/2021  2:00 PM L[facility]- NEUROPSYCH TECH [facility] None  08/07/2021 10:00  AM Hazle Coca, PhD [facility] None  08/11/2021  7:40 AM [facility] DEVICE REMOTES [facility]  09/10/2021  7:40 AM [facility] DEVICE REMOTES  [facility]  03/04/2022  8:50 AM Patel, Arvin Collard K, DO [facility] None     ACTION: Home visit completed

## 2021-04-21 NOTE — Progress Notes (Signed)
Remote ICD transmission.   

## 2021-04-22 ENCOUNTER — Other Ambulatory Visit (HOSPITAL_COMMUNITY): Payer: Self-pay

## 2021-04-22 NOTE — Progress Notes (Signed)
Paramedicine Encounter    Patient ID: Rhonda [name], female    DOB: February 27, 1951, 70 y.o.   MRN: [id number]   Patient Care Team: Maurice Small, MD (Inactive) as PCP - General (Family Medicine) [name], LCSW as Social Worker (Licensed Clinical Social Worker) Alda Berthold, DO as Consulting Physician (Neurology)  Patient Active Problem List   Diagnosis Date Noted   Abnormal gait 06/11/2020   Adult failure to thrive syndrome 06/11/2020   Allergic rhinitis 06/11/2020   Anxiety 06/11/2020   Asthma 06/11/2020   Benign intracranial hypertension 06/11/2020   Body mass index (BMI) 45.0-49.9, adult ([place]) 06/11/2020   Bowel incontinence 06/11/2020   Cholelithiasis 06/11/2020   Chronic sinusitis 06/11/2020   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   Daytime somnolence 06/11/2020   Diarrhea of presumed infectious origin 06/11/2020   Edema 06/11/2020   Family history of malignant neoplasm of gastrointestinal tract 06/11/2020   Gout 06/11/2020   History of fall 06/11/2020   History of infectious disease 06/11/2020   Insomnia 06/11/2020   Irregular bowel habits 06/11/2020   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   Lumbar spondylosis with myelopathy 06/11/2020   Macrocytosis 06/11/2020   Mild recurrent major depression ([place]) 06/11/2020   Personal history of colonic polyps 06/11/2020   Personal history of malignant neoplasm of breast 06/11/2020   Recurrent falls 06/11/2020   Mixed anxiety and depressive disorder 06/11/2020   Irritable bowel syndrome 01/04/2020   Spinal stenosis of lumbar region 01/03/2020   Other symptoms and signs involving the musculoskeletal system 09/11/2019   Bilateral leg weakness 08/01/2019   Leg swelling 08/01/2019   Diabetes mellitus with neuropathy ([place]) 06/15/2019   Hypokalemia 11/08/2018   CKD (chronic kidney disease) stage 3, GFR 30-59 ml/min (HCC) 11/08/2018   Orthostatic hypotension 07/28/2017   SVD (spontaneous vaginal delivery)    Peripheral neuropathy    On home oxygen  therapy    Migraines    Left bundle branch block    Hypothyroidism    Hypertension    Hyperlipidemia    Heart murmur    GERD (gastroesophageal reflux disease)    Exertional shortness of breath    Major depressive disorder    Back pain    Arthritis    Generalized anxiety disorder    Anemia    Chronic respiratory failure ([place]) 09/14/2013   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   Morbid obesity ([place]) 11/22/1599   Chronic systolic heart failure ([place]) 10/27/2012   Endometrial polyp 01/20/2012   Malignant tumor of breast ([place]) 03/26/2011   Unspecified vitamin D deficiency 03/26/2011    Current Outpatient Medications:    acetaZOLAMIDE (DIAMOX) 125 MG tablet, Take 1 tablet (125 mg total) by mouth daily., Disp: 90 tablet, Rfl: 3   carvedilol (COREG) 3.125 MG tablet, TAKE 1 TABLET (3.125 MG) BY MOUTH TWICE DAILY WITH MEALS, Disp: 180 tablet, Rfl: 3   citalopram (CELEXA) 40 MG tablet, Take 40 mg by mouth daily., Disp: , Rfl:    clonazePAM (KLONOPIN) 0.5 MG tablet, , Disp: , Rfl:    dicyclomine (BENTYL) 20 MG tablet, Take 20 mg by mouth 3 (three) times daily before meals., Disp: , Rfl:    DULoxetine (CYMBALTA) 60 MG capsule, Take 60 mg by mouth daily., Disp: , Rfl:    fexofenadine (ALLEGRA) 180 MG tablet, Take 180 mg by mouth 2 (two) times daily. (Patient not taking: Reported on 04/08/2021), Disp: , Rfl:    fludrocortisone (FLORINEF) 0.1 MG tablet, Take 1 tablet (100 mcg total) by mouth  2 (two) times daily., Disp: 180 tablet, Rfl: 3   fluticasone (FLONASE) 50 MCG/ACT nasal spray, , Disp: , Rfl:    gabapentin (NEURONTIN) 300 MG capsule, Take 1 capsule by mouth 3 (three) times daily., Disp: , Rfl:    gabapentin (NEURONTIN) 800 MG tablet, TAKE 1 TABLET AT BEDTIME AS NEEDED FOR NEUROPATHY, Disp: 30 tablet, Rfl: 0   gabapentin (NEURONTIN) 800 MG tablet, Take 800 mg by mouth at bedtime., Disp: , Rfl:    Ginkgo Biloba 120 MG CAPS, See admin instructions.  (Patient not taking: Reported on 04/08/2021), Disp: , Rfl:    ipratropium (ATROVENT) 0.03 % nasal spray, Place 2 sprays into both nostrils 2 (two) times daily., Disp: , Rfl:    levothyroxine (SYNTHROID) 88 MCG tablet, TAKE 1 TABLET(88 MCG) BY MOUTH DAILY BEFORE BREAKFAST, Disp: 90 tablet, Rfl: 3   metolazone (ZAROXOLYN) 2.5 MG tablet, TAKE 1 TABLET BY MOUTH EVERY TUESDAY, Disp: 12 tablet, Rfl: 3   midodrine (PROAMATINE) 10 MG tablet, TAKE 1 TABLET (10 MG TOTAL) BY MOUTH 3 (THREE) TIMES DAILY WITH MEALS., Disp: 270 tablet, Rfl: 3   mirtazapine (REMERON) 30 MG tablet, 1 tablet at bedtime., Disp: , Rfl:    montelukast (SINGULAIR) 10 MG tablet, Take 10 mg by mouth at bedtime.  (Patient not taking: Reported on 04/01/2021), Disp: , Rfl: 1   Multiple Vitamin (MULTIVITAMIN) tablet, Take 1 tablet by mouth daily., Disp: , Rfl:    omeprazole (PRILOSEC) 40 MG capsule, Take 40 mg by mouth daily., Disp: , Rfl:    potassium chloride SA (KLOR-CON) 20 MEQ tablet, TAKE 2 TABLETS THREE TIMES DAILY. TAKE AN EXTRA 2 TABLETS ON DAYS YOU TAKE METOLAZONE, Disp: 572 tablet, Rfl: 0   probenecid (BENEMID) 500 MG tablet, Take 500 mg by mouth 2 (two) times daily., Disp: , Rfl:    simvastatin (ZOCOR) 10 MG tablet, Take 10 mg by mouth every evening., Disp: , Rfl:    spironolactone (ALDACTONE) 25 MG tablet, Take 1 tablet (25 mg total) by mouth every evening. Needs appt for further refills, Disp: 90 tablet, Rfl: 0   torsemide (DEMADEX) 20 MG tablet, Take 4 tablets (80 mg total) by mouth every morning AND 3 tablets (60 mg total) every evening., Disp: 630 tablet, Rfl: 3 Allergies  Allergen Reactions   Ceftin Anaphylaxis    Face and throat swell    Geodon [Ziprasidone Hcl] Hives   Lisinopril Other (See Comments)    angioedema   Shellfish Allergy Other (See Comments)    Gout exacerbation   Shellfish-Derived Products     Other reaction(s): Other   Ultram [Tramadol] Itching   Allopurinol Nausea Only and Other (See Comments)     weakness   Ativan [Lorazepam] Itching   Sulfa Antibiotics Itching   Ultram [Tramadol Hcl] Itching   Valium [Diazepam] Other (See Comments)    Patient states that diazepam doesn't relax, it has the opposite effect.     Social History   Socioeconomic History   Marital status: Married    Spouse name: Not on file   Number of children: 2   Years of education: 81   Highest education level: Master's degree (e.g., MA, MS, MEng, MEd, MSW, MBA)  Occupational History   Occupation: retired  Tobacco Use   Smoking status: Some Days    Packs/day: 0.10    Years: 26.00    Pack years: 2.60    Types: Cigarettes    Last attempt to quit: 05/06/2007    Years since quitting: 13.9  Smokeless tobacco: Never  Vaping Use   Vaping Use: Never used  Substance and Sexual Activity   Alcohol use: No   Drug use: No   Sexual activity: Yes  Other Topics Concern   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  Constitutional:      Appearance: Normal appearance.  HENT:     Head: Normocephalic.     Nose: Congestion and rhinorrhea present.     Mouth/Throat:     Mouth: Mucous membranes are moist.     Pharynx: Oropharynx is clear.  Eyes:     Conjunctiva/sclera: Conjunctivae normal.     Pupils: Pupils are equal, round, and reactive to light.  Cardiovascular:     Rate and Rhythm: Normal rate and  regular rhythm.     Pulses: Normal pulses.     Heart sounds: Normal heart sounds.  Pulmonary:     Effort: Pulmonary effort is normal. No respiratory distress.     Breath sounds: Normal breath sounds. No stridor. No wheezing, rhonchi or rales.  Abdominal:     General: Abdomen is flat.     Palpations: Abdomen is soft.  Musculoskeletal:        General: Swelling present. Normal range of motion.     Cervical back: Normal range of motion.     Right lower leg: Edema present.     Left lower leg: Edema present.  Skin:    General: Skin is warm and dry.     Capillary Refill: Capillary refill takes less than 2 seconds.  Neurological:     General: No focal deficit present.     Mental Status: She is alert. Mental status is at baseline.  Psychiatric:        Mood and Affect: Mood normal.    Arrived for home visit for Cheril who reports feeling better this week but continuing to have some upper respiratory congestion, mucus production and nasal congestion. She completed Amoxicillin and Mucinex D and is no longer taking anything OTC. Lung sounds clear but congestive cough noted. Some slight lower leg edema noted. Mrs. Mehta's weight up 2 lbs this week. She has had a consistent weight gain over the last several weeks. She admits to drinking juices and eating soups. I have coached her multiple times on this matter with no real improvements.   Vitals obtained and assessment as recorded.   Medications reviewed and confirmed. Pill box filled accordingly. Mrs. Delph given her morning meds at time of visit today.   We reviewed appointments and confirmed same. She admits she will be at clinic visit tomorrow. I plan to see her there tomorrow.   Refills- NONE     Future Appointments  Date Time Provider [place]  04/23/2021 12:00 PM [facility] PA/NP  [facility] None  04/30/2021  2:45 PM Candyce Churn B, PT [facility] None  06/25/2021  1:40 PM Bensimhon, Shaune Pascal, MD [facility] None  07/10/2021  7:40 AM  [facility] DEVICE REMOTES [facility]  07/31/2021  1:00 PM Hazle Coca, PhD [facility] None  07/31/2021  2:00 PM [facility] None  08/07/2021 10:00 AM Hazle Coca, PhD [facility] None  08/11/2021  7:40 AM [facility] DEVICE REMOTES [facility]  09/10/2021  7:40 AM [facility] DEVICE REMOTES [facility]  03/04/2022  8:50 AM Patel, Arvin Collard K, DO [facility] None     ACTION: Home visit completed

## 2021-04-23 ENCOUNTER — Encounter (HOSPITAL_COMMUNITY): Payer: Self-pay

## 2021-04-23 ENCOUNTER — Encounter (HOSPITAL_COMMUNITY): Payer: Medicare HMO

## 2021-04-23 ENCOUNTER — Telehealth (HOSPITAL_COMMUNITY): Payer: Self-pay

## 2021-04-23 ENCOUNTER — Ambulatory Visit (HOSPITAL_COMMUNITY)
Admission: RE | Admit: 2021-04-23 | Discharge: 2021-04-23 | Disposition: A | Discharge status: Home / Self Care | Payer: Medicare HMO | Source: Ambulatory Visit | Attending: Family Medicine | Admitting: Family Medicine

## 2021-04-23 VITALS — BP 142/74 | HR 81 | Wt 229.6 lb

## 2021-04-23 DIAGNOSIS — Z853 Personal history of malignant neoplasm of breast: Secondary | ICD-10-CM | POA: Diagnosis not present

## 2021-04-23 DIAGNOSIS — Z923 Personal history of irradiation: Secondary | ICD-10-CM | POA: Diagnosis not present

## 2021-04-23 DIAGNOSIS — M109 Gout, unspecified: Secondary | ICD-10-CM | POA: Diagnosis not present

## 2021-04-23 DIAGNOSIS — G932 Benign intracranial hypertension: Secondary | ICD-10-CM | POA: Diagnosis not present

## 2021-04-23 DIAGNOSIS — Z9221 Personal history of antineoplastic chemotherapy: Secondary | ICD-10-CM | POA: Insufficient documentation

## 2021-04-23 DIAGNOSIS — I878 Other specified disorders of veins: Secondary | ICD-10-CM

## 2021-04-23 DIAGNOSIS — B379 Candidiasis, unspecified: Secondary | ICD-10-CM | POA: Diagnosis not present

## 2021-04-23 DIAGNOSIS — I10 Essential (primary) hypertension: Secondary | ICD-10-CM

## 2021-04-23 DIAGNOSIS — I130 Hypertensive heart and chronic kidney disease with heart failure and stage 1 through stage 4 chronic kidney disease, or unspecified chronic kidney disease: Secondary | ICD-10-CM | POA: Insufficient documentation

## 2021-04-23 DIAGNOSIS — I951 Orthostatic hypotension: Secondary | ICD-10-CM | POA: Insufficient documentation

## 2021-04-23 DIAGNOSIS — N1832 Chronic kidney disease, stage 3b: Secondary | ICD-10-CM | POA: Diagnosis not present

## 2021-04-23 DIAGNOSIS — I428 Other cardiomyopathies: Secondary | ICD-10-CM | POA: Insufficient documentation

## 2021-04-23 DIAGNOSIS — I5022 Chronic systolic (congestive) heart failure: Secondary | ICD-10-CM | POA: Diagnosis not present

## 2021-04-23 DIAGNOSIS — E1122 Type 2 diabetes mellitus with diabetic chronic kidney disease: Secondary | ICD-10-CM | POA: Insufficient documentation

## 2021-04-23 DIAGNOSIS — Z6841 Body Mass Index (BMI) 40.0 and over, adult: Secondary | ICD-10-CM | POA: Diagnosis not present

## 2021-04-23 DIAGNOSIS — Z79899 Other long term (current) drug therapy: Secondary | ICD-10-CM | POA: Diagnosis not present

## 2021-04-23 DIAGNOSIS — N183 Chronic kidney disease, stage 3 unspecified: Secondary | ICD-10-CM | POA: Diagnosis not present

## 2021-04-23 DIAGNOSIS — Z9181 History of falling: Secondary | ICD-10-CM | POA: Insufficient documentation

## 2021-04-23 LAB — BASIC METABOLIC PANEL
Anion gap: 11 (ref 5–15)
BUN: 33 mg/dL — ABNORMAL HIGH (ref 8–23)
CO2: 32 mmol/L (ref 22–32)
Calcium: 9 mg/dL (ref 8.9–10.3)
Chloride: 94 mmol/L — ABNORMAL LOW (ref 98–111)
Creatinine, Ser: 1.71 mg/dL — ABNORMAL HIGH (ref 0.44–1.00)
GFR, Estimated: 32 mL/min — ABNORMAL LOW (ref >60)
Glucose, Bld: 94 mg/dL (ref 70–99)
Potassium: 2.9 mmol/L — ABNORMAL LOW (ref 3.5–5.1)
Sodium: 137 mmol/L (ref 135–145)

## 2021-04-23 LAB — BRAIN NATRIURETIC PEPTIDE: B Natriuretic Peptide: 40 pg/mL (ref 0.0–100.0)

## 2021-04-23 NOTE — Telephone Encounter (Signed)
Left message for Rhonda [name] advising her I will be out in the morning to correct medication changes tomorrow. Call complete.

## 2021-04-23 NOTE — Progress Notes (Addendum)
ADVANCED HF CLINIC NOTE   Patient ID: Rhonda [name], female   DOB: 1951-01-28, 70 y.o.   MRN: [id number]  Primary Cardiologist: Dr Radford Pax  General Surgeon: Dr Donne Hazel  Orthopedic: Dr Alvan Dame  PCP: Dr Justin Mend HF Cardiologist: Dr. Haroldine Laws  HPI: Rhonda [name] is a 70 y.o. female with a PMH of morbid obesity, cleft palate s/p repair, anxiety/depression, breast cancer (triple negative invasive ductal carcinoma) S/P chemo/radiation with 5 cycles of taxotere and carboplatinum 08/2681, chronic systolic heart failure thought to be due to viral CM dating back 1999 with normal cath 2010, HTN and chronic respiratory failure on 3 liters O2 at night. She has had sleep study with no  evidence of sleep apnea in remote past.  She has a Medtronic CRT-D device. Echo in 7/18 showed recovery of EF to 55-60%.   Has been followed by Rhonda [name] in Neurology. Has been on Florinef and midodrine for orthostatic hypotension (failed mestinon).   Last seen 02/2020 with stable NYHA II symptoms, volume controlled on torsemide 60/40 + metolazone weekly.  She was last seen in 10/22. Weight up 20 lb over 5 months. Weight gain felt to be due to combination of caloric intake and volume overload. Clinic weight 234 lb, had been 210 lb at f/u 10/21. Weight previously 170 lb in 12/20. Torsemide increased to 80 mg am/60 mg pm + weekly metolazone on Tuesday.   She is here today for close HF f/u. Continues to have some dyspnea with exertion. Reports she recently recovered from respiratory illness and was treated with antibiotics. Per paramedicine, she tested negative for COVID and flu. Clinic weight down 5 lb from last visit, 229 lb today. Reports a little bit of abdominal distension and leg edema. No CP. She is followed closely by Paramedicine and coached on sodium intake. Frequently eats soups, canned tuna, prepares seasoned meats with sodium. She has a BBQ takeout sandwich in clinic today.  Device interrogation (personally  reviewed): No AT/AF, no VT, Optivol low, thoracic impedance trending up   PYP 4/19 negative TTR  10/14/12 Echo: EF 35% RV ok  04/12/13 Echo: EF 20-25%, LV moderately dilated 10/11/2013 Echo: EF 45-50% 7/18 Echo: EF 55-60%, normal RV size and systolic function, PASP 34 mmHg 06/07/19 Echo: EF 60-65% grade II DD. RV ok  11/24/12 Creatinine 1.23 Potassium 4.1 12/15/12 Creatinine 2.03 Potassium 3.3  12/22/12 Creatinine 1.2 Potassium 3.8 02/23/13 Creatinine 1.68 K 4.4  05/02/13 K 4.2 Creatinine 1.8  10/11/13 K 3.7 Creatinine 1.69 10/17 K 4.1, creatinine 1.35 09/2017: K3.8 Creatinine 1.57  Review of systems complete and found to be negative unless listed in HPI.   Past Medical History:  Diagnosis Date   Anemia    Arthritis    Right knee   Asthma    Back pain    Disk problem   Breast cancer, stage 1 ([place]) 03/26/2011   Left; completed chemotherapy and radiation treatments   Cardiomyopathy    Chronic respiratory failure ([place]) 09/10/6220   Chronic systolic heart failure (HCC)    a) NICM b) ECHO (03/2013) EF 20-25% c) ECHO (09/2013) EF 45-50%, grade I DD   CKD (chronic kidney disease) stage 3, GFR 30-59 ml/min (HCC) 9/79/8921   Complication of anesthesia    History of low blood pressure after surgery; attributed to lying flat   Diabetes mellitus    "diet controlled" (05/03/2013)   Exertional shortness of breath    Generalized anxiety disorder    GERD (gastroesophageal reflux disease)  Gout    Heart murmur    Hyperlipidemia    Hypertension    Hypokalemia 11/08/2018   Hypothyroidism    Left bundle branch block    s/p CRT-D (04/2013)   Major depressive disorder    Migraines    On home oxygen therapy    "2L suppose to be q night" (05/03/2013)   Orthostatic hypotension 07/28/2017   Peripheral neuropathy    Feet   SVD (spontaneous vaginal delivery)    x 2   Unspecified vitamin D deficiency 03/26/2011   Does not take meds   Current Outpatient Medications  Medication Sig Dispense Refill    acetaZOLAMIDE (DIAMOX) 125 MG tablet Take 1 tablet (125 mg total) by mouth daily. 90 tablet 3   carvedilol (COREG) 3.125 MG tablet TAKE 1 TABLET (3.125 MG) BY MOUTH TWICE DAILY WITH MEALS 180 tablet 3   citalopram (CELEXA) 40 MG tablet Take 40 mg by mouth daily.     clonazePAM (KLONOPIN) 0.5 MG tablet      dicyclomine (BENTYL) 20 MG tablet Take 20 mg by mouth 3 (three) times daily before meals.     DULoxetine (CYMBALTA) 60 MG capsule Take 60 mg by mouth daily.     fexofenadine (ALLEGRA) 180 MG tablet Take 180 mg by mouth 2 (two) times daily.     fludrocortisone (FLORINEF) 0.1 MG tablet Take 1 tablet (100 mcg total) by mouth 2 (two) times daily. 180 tablet 3   fluticasone (FLONASE) 50 MCG/ACT nasal spray      gabapentin (NEURONTIN) 300 MG capsule Take 1 capsule by mouth 3 (three) times daily.     gabapentin (NEURONTIN) 800 MG tablet TAKE 1 TABLET AT BEDTIME AS NEEDED FOR NEUROPATHY 30 tablet 0   gabapentin (NEURONTIN) 800 MG tablet Take 800 mg by mouth at bedtime.     Ginkgo Biloba 120 MG CAPS See admin instructions.     ipratropium (ATROVENT) 0.03 % nasal spray Place 2 sprays into both nostrils 2 (two) times daily.     levothyroxine (SYNTHROID) 88 MCG tablet TAKE 1 TABLET(88 MCG) BY MOUTH DAILY BEFORE BREAKFAST 90 tablet 3   metolazone (ZAROXOLYN) 2.5 MG tablet TAKE 1 TABLET BY MOUTH EVERY TUESDAY 12 tablet 3   midodrine (PROAMATINE) 10 MG tablet TAKE 1 TABLET (10 MG TOTAL) BY MOUTH 3 (THREE) TIMES DAILY WITH MEALS. 270 tablet 3   mirtazapine (REMERON) 30 MG tablet 1 tablet at bedtime.     montelukast (SINGULAIR) 10 MG tablet Take 10 mg by mouth at bedtime.  1   Multiple Vitamin (MULTIVITAMIN) tablet Take 1 tablet by mouth daily.     omeprazole (PRILOSEC) 40 MG capsule Take 40 mg by mouth daily.     potassium chloride SA (KLOR-CON) 20 MEQ tablet TAKE 2 TABLETS THREE TIMES DAILY. TAKE AN EXTRA 2 TABLETS ON DAYS YOU TAKE METOLAZONE 572 tablet 0   simvastatin (ZOCOR) 10 MG tablet Take 10 mg by  mouth every evening.     spironolactone (ALDACTONE) 25 MG tablet Take 1 tablet (25 mg total) by mouth every evening. Needs appt for further refills 90 tablet 0   torsemide (DEMADEX) 20 MG tablet Take 4 tablets (80 mg total) by mouth every morning AND 3 tablets (60 mg total) every evening. 630 tablet 3   No current facility-administered medications for this encounter.   BP (!) 142/74   Pulse 81   Wt 104.1 kg (229 lb 9.6 oz)   SpO2 96%   BMI 44.84 kg/m   Wt  Readings from Last 3 Encounters:  04/23/21 104.1 kg (229 lb 9.6 oz)  04/22/21 104.3 kg (230 lb)  04/15/21 103.4 kg (228 lb)    PHYSICAL EXAM: General:  No distress. Husband present. Walked into clinic with a cane. HEENT: normal Neck: supple. JVP difficult to assess. Carotids 2+ bilat; no bruits.  Cor: PMI nondisplaced. Regular rate & rhythm. No rubs, gallops or murmurs. Lungs: clear Abdomen: soft, nontender, nondistended, obese Extremities: no cyanosis, clubbing, rash, trace edema Neuro: alert & orientedx3, cranial nerves grossly intact. moves all 4 extremities w/o difficulty. Affect pleasant   ASSESSMENT & PLAN: 1) Chronic systolic HF with recovered EF/nonischemic cardiomyopathy:  - NICM s/p Medtronic CRT-D, ?Viral myocarditis. Cardiomyopathy dates from 74.  - EF 45-50% (09/2013)  - EF up to 55-60% on 20202.  - PYP 09/02/17 negative for TTR (Grade 0-1, H/CCL 1.2). SPEP with no M-spike.  - Echo 06/07/19: EF 60-65% grade II DD. RV ok.  - NYHA II-IIIa. Confounded by deconditioning and obesity. Volume difficult to assess, but does not appear overloaded. However, OptiVol trending low and thoracic impedance going up above reference.  - Recommended she return to [employer] program [facility].  - Continue torsemide 80 mg q am/60 mg q pm+ metolazone weekly on Tuesdays.   - Will stop probenecid. No recent gout flare. Medication has potential to decrease diuretic effect of loop diuretics. - Continue carvedilol 3.125 mg bid. - Continue  spironolactone 25 mg daily.  - No SGT2i with chronic yeast infections. - She needs to wear her compression hose. She reports she lost her prescription. Reordered today. - BMET/BNP today - Repeat sleep study ordered, > 50 lb weight gain last 2 years - Repeat echo  2) Obesity - Body mass index is 44.84 kg/m. - Significant weight gain the last couple of years.  - Discussed portion control again today.  - Resume PREP program.  3) HTN - BP elevated today but has been controlled at home and at prior clinic f/u.  - Monitor  4) Orthostatic Hypotension.  - Neurology managing. She is on diamox for increased ICP - Has been maintained on Florinef and midodrine 10 mg tid.   5) Chronic Venous stasis - Compression stockings and leg elevation. Given Rx today.  6) Falls  - No recent falls. - Improved. Seeing Neurology.   7) CKD 3 - Creatinine baseline 1.4-1.6 - BMET today  Follow up: 02/23 with Dr. Haroldine Laws as scheduled  Appreciate assistance from paramedicine. Has weekly visits scheduled on Tuesdays  [facility], Chesky Heyer N PA-C 3:42 PM

## 2021-04-23 NOTE — Progress Notes (Incomplete)
ADVANCED HF CLINIC NOTE   Patient ID: Rhonda [name], female   DOB: 16-Sep-1950, 70 y.o.   MRN: [id number]  Primary Cardiologist: Dr Radford Pax  General Surgeon: Dr Donne Hazel  Orthopedic: Dr Alvan Dame  PCP: Dr Justin Mend HF Cardiologist: Dr. Haroldine Laws  HPI: Rhonda [name] is a 69 y.o. 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 She has had sleep study with no  evidence of sleep apnea.  She has a Medtronic CRT-D device. Echo in 7/18 showed recovery of EF to 55-60%.   Has been followed by Narda Amber in Neurology. Has been on Florinef and midodrine for orthostatic hypotension (failed mestinon).   Last seen 02/2020 with stable NYHA II symptoms, volume controlled on torsemide 60/40 + metolazone weekly.  Today she returns for HF follow up with Paramedicine. She has gained about 20 lbs over past 5 months. She uses a walker for balance and is doing PREP 3 days a week.  Some SOB with stairs or walking further distances on flat ground.  Denies CP, dizziness, edema, or PND/Orthopnea. Appetite ok, drinking a lot of sweet tea. No fever or chills. Weight at home 234 pounds. Previously at 190 lbs. Taking all medications.   Device interrogation (personally reviewed): OptiVol OK but trending up, thoracic impedence down, >99% v-pacing, no AT/AF Activity ~ 2 hours/day.  PYP 4/19 negative TTR  10/14/12 Echo: EF 35% RV ok  04/12/13 Echo: EF 20-25%, LV moderately dilated 10/11/2013 Echo: EF 45-50% 7/18 Echo: EF 55-60%, normal RV size and systolic function, PASP 34 mmHg 06/07/19 Echo: EF 60-65% grade II DD. RV ok  11/24/12 Creatinine 1.23 Potassium 4.1 12/15/12 Creatinine 2.03 Potassium 3.3  12/22/12  Creatinine 1.2 Potassium 3.8 02/23/13 Creatinine 1.68 K 4.4  05/02/13 K 4.2 Creatinine 1.8  10/11/13 K 3.7 Creatinine 1.69 10/17 K 4.1, creatinine 1.35 09/2017: K3.8 Creatinine 1.57  Review of systems complete and found to be negative unless listed in HPI.   Past Medical History:  Diagnosis Date   Anemia    Arthritis    Right knee   Asthma    Back pain    Disk problem   Breast cancer, stage 1 ([place]) 03/26/2011   Left; completed chemotherapy and radiation treatments   Cardiomyopathy    Chronic respiratory failure ([place]) 4/78/2956   Chronic systolic heart failure (HCC)    a) NICM b) ECHO (03/2013) EF 20-25% c) ECHO (09/2013) EF 45-50%, grade I DD   CKD (chronic kidney disease) stage 3, GFR 30-59 ml/min (HCC) 07/08/863   Complication of anesthesia    History of low blood pressure after surgery; attributed to lying flat   Diabetes mellitus    "diet controlled" (05/03/2013)   Exertional shortness of breath    Generalized anxiety disorder    GERD (gastroesophageal reflux disease)    Gout    Heart murmur    Hyperlipidemia    Hypertension    Hypokalemia 11/08/2018   Hypothyroidism    Left bundle branch block    s/p CRT-D (04/2013)   Major depressive disorder    Migraines    On home oxygen therapy    "2L suppose to be q night" (05/03/2013)   Orthostatic hypotension 07/28/2017  Peripheral neuropathy    Feet   SVD (spontaneous vaginal delivery)    x 2   Unspecified vitamin D deficiency 03/26/2011   Does not take meds   Current Outpatient Medications  Medication Sig Dispense Refill   acetaZOLAMIDE (DIAMOX) 125 MG tablet Take 1 tablet (125 mg total) by mouth daily. 90 tablet 3   carvedilol (COREG) 3.125 MG tablet TAKE 1 TABLET (3.125 MG) BY MOUTH TWICE DAILY WITH MEALS 180 tablet 3   citalopram (CELEXA) 40 MG tablet Take 40 mg by mouth daily.     clonazePAM (KLONOPIN) 0.5 MG tablet      dicyclomine (BENTYL) 20 MG tablet Take 20 mg by mouth 3 (three) times daily before meals.      DULoxetine (CYMBALTA) 60 MG capsule Take 60 mg by mouth daily.     fexofenadine (ALLEGRA) 180 MG tablet Take 180 mg by mouth 2 (two) times daily. (Patient not taking: Reported on 04/08/2021)     fludrocortisone (FLORINEF) 0.1 MG tablet Take 1 tablet (100 mcg total) by mouth 2 (two) times daily. 180 tablet 3   fluticasone (FLONASE) 50 MCG/ACT nasal spray      gabapentin (NEURONTIN) 300 MG capsule Take 1 capsule by mouth 3 (three) times daily.     gabapentin (NEURONTIN) 800 MG tablet TAKE 1 TABLET AT BEDTIME AS NEEDED FOR NEUROPATHY 30 tablet 0   gabapentin (NEURONTIN) 800 MG tablet Take 800 mg by mouth at bedtime.     Ginkgo Biloba 120 MG CAPS See admin instructions. (Patient not taking: Reported on 04/08/2021)     ipratropium (ATROVENT) 0.03 % nasal spray Place 2 sprays into both nostrils 2 (two) times daily.     levothyroxine (SYNTHROID) 88 MCG tablet TAKE 1 TABLET(88 MCG) BY MOUTH DAILY BEFORE BREAKFAST 90 tablet 3   metolazone (ZAROXOLYN) 2.5 MG tablet TAKE 1 TABLET BY MOUTH EVERY TUESDAY 12 tablet 3   midodrine (PROAMATINE) 10 MG tablet TAKE 1 TABLET (10 MG TOTAL) BY MOUTH 3 (THREE) TIMES DAILY WITH MEALS. 270 tablet 3   mirtazapine (REMERON) 30 MG tablet 1 tablet at bedtime.     montelukast (SINGULAIR) 10 MG tablet Take 10 mg by mouth at bedtime.  (Patient not taking: Reported on 04/01/2021)  1   Multiple Vitamin (MULTIVITAMIN) tablet Take 1 tablet by mouth daily.     omeprazole (PRILOSEC) 40 MG capsule Take 40 mg by mouth daily.     potassium chloride SA (KLOR-CON) 20 MEQ tablet TAKE 2 TABLETS THREE TIMES DAILY. TAKE AN EXTRA 2 TABLETS ON DAYS YOU TAKE METOLAZONE 572 tablet 0   probenecid (BENEMID) 500 MG tablet Take 500 mg by mouth 2 (two) times daily.     simvastatin (ZOCOR) 10 MG tablet Take 10 mg by mouth every evening.     spironolactone (ALDACTONE) 25 MG tablet Take 1 tablet (25 mg total) by mouth every evening. Needs appt for further refills 90 tablet 0   torsemide (DEMADEX) 20 MG  tablet Take 4 tablets (80 mg total) by mouth every morning AND 3 tablets (60 mg total) every evening. 630 tablet 3   No current facility-administered medications for this visit.   There were no vitals taken for this visit.  Wt Readings from Last 3 Encounters:  04/22/21 104.3 kg (230 lb)  04/15/21 103.4 kg (228 lb)  04/08/21 104.3 kg (230 lb)    PHYSICAL EXAM: General:  NAD. No resp difficulty, walked into clinic with cane HEENT: Normal Neck: Supple. No JVD. Carotids 2+ bilat; no  bruits. No lymphadenopathy or thryomegaly appreciated. Cor: PMI nondisplaced. Regular rate & rhythm. No rubs, gallops or murmurs. Lungs: Clear Abdomen: Obese,  nontender, nondistended. No hepatosplenomegaly. No bruits or masses. Good bowel sounds. Extremities: No cyanosis, clubbing, rash, 1+ BLE edema Neuro: Alert & oriented x 3, cranial nerves grossly intact. Moves all 4 extremities w/o difficulty. Affect pleasant.  ASSESSMENT & PLAN: 1) Chronic systolic HF:  - NICM s/p Medtronic CRT-D, ?Viral myocarditis. Cardiomyopathy dates from 52.  - EF 45-50% (09/2013)  - EF up to 55-60% on 20202.  - PYP 09/02/17 negative for TTR (Grade 0-1, H/CCL 1.2). SPEP with no M-spike.  - Echo 06/07/19: EF 60-65% grade II DD. RV ok.  - NYHA II. Exam difficult for volume, but she looks mildly volume up on exam and Optivol trending up. I think some of her weight gain can be attributed to increased caloric intake. - Increase torsemide to 80 mg q am/60 mg q pm+ metolazone weekly on Tuesdays.   - Continue carvedilol 3.125 mg bid. - Continue spironolactone 25 mg daily.  - No SGT2i with chronic yeast infections. - She needs to wear her compression hose. We discussed this today. - BMET/BNP today.   2) Obesity - There is no height or weight on file to calculate BMI. - Discussed portion control.  - Continue PREP program.  3) HTN - Stable.  - BMET today.  4) Orthostatic Hypotension.  - Neurology managing. She is on diamox for  increased ICP - Continue Florinef and midodrine 10 mg tid.   5) Chronic Venous stasis - Plan as above.  - Place compression stockings and elevation. Given Rx today.  6) Falls  - No recent falls. - Improved. Seeing Neurology.   7) CKD 3 - Creatinine baseline 1.4-1.6 - Check BMET   Follow up in 3-4 weeks with APP to reassess volume and 3-4 months with Dr. Haroldine Laws. Continue HF Paramedicine, appreciate their assistance.   Rafael Bihari FNP 8:02 AM

## 2021-04-23 NOTE — Patient Instructions (Addendum)
Thank You for coming in today  Labs were done, if any labs are abnormal the clinic will call you  EKG was done today  STOP Probenecid  You were set up for at home sleep study, and given instructions   [place]  Your physician recommends that you schedule a follow-up appointment in: 3 months with echocardiogram   At the [facility], you and your health needs are our priority. As part of our continuing mission to provide you with exceptional heart care, we have created designated Provider Care Teams. These Care Teams include your primary Cardiologist (physician) and Advanced Practice Providers (APPs- Physician Assistants and Nurse Practitioners) who all work together to provide you with the care you need, when you need it.   You may see any of the following providers on your designated Care Team at your next follow up: Dr Glori Bickers Dr Haynes Kerns, NP Lyda Jester, [place] [facility] [place], [place] Audry Riles, PharmD   Please be sure to bring in all your medications bottles to every appointment.   If you have any questions or concerns before your next appointment please send [place] a message through [place] [facility] or call our office at [phone].    TO LEAVE A MESSAGE FOR THE NURSE SELECT OPTION 2, PLEASE LEAVE A MESSAGE INCLUDING: YOUR NAME DATE OF BIRTH CALL BACK NUMBER REASON FOR CALL**this is important as we prioritize the call backs  YOU WILL RECEIVE A CALL BACK THE SAME DAY AS LONG AS YOU CALL BEFORE 4:00 PM

## 2021-04-24 ENCOUNTER — Encounter (INDEPENDENT_AMBULATORY_CARE_PROVIDER_SITE_OTHER): Payer: Medicare HMO | Admitting: Cardiology

## 2021-04-24 ENCOUNTER — Other Ambulatory Visit (HOSPITAL_COMMUNITY): Payer: Self-pay

## 2021-04-24 ENCOUNTER — Telehealth: Payer: Self-pay | Admitting: Neurology

## 2021-04-24 ENCOUNTER — Other Ambulatory Visit (HOSPITAL_COMMUNITY): Payer: Self-pay | Admitting: *Deleted

## 2021-04-24 DIAGNOSIS — I5022 Chronic systolic (congestive) heart failure: Secondary | ICD-10-CM

## 2021-04-24 DIAGNOSIS — G4733 Obstructive sleep apnea (adult) (pediatric): Secondary | ICD-10-CM

## 2021-04-24 DIAGNOSIS — R400 Somnolence: Secondary | ICD-10-CM | POA: Diagnosis not present

## 2021-04-24 DIAGNOSIS — Z79899 Other long term (current) drug therapy: Secondary | ICD-10-CM | POA: Diagnosis not present

## 2021-04-24 DIAGNOSIS — N1832 Chronic kidney disease, stage 3b: Secondary | ICD-10-CM | POA: Diagnosis not present

## 2021-04-24 DIAGNOSIS — N390 Urinary tract infection, site not specified: Secondary | ICD-10-CM | POA: Diagnosis not present

## 2021-04-24 DIAGNOSIS — M1000 Idiopathic gout, unspecified site: Secondary | ICD-10-CM | POA: Diagnosis not present

## 2021-04-24 DIAGNOSIS — I509 Heart failure, unspecified: Secondary | ICD-10-CM | POA: Diagnosis not present

## 2021-04-24 NOTE — Telephone Encounter (Signed)
I am not familiar with this supplement. There are many supplements marketed for brain health, however, because there is no evidence-based data that shows that it works, I do not recommend them.

## 2021-04-24 NOTE — Telephone Encounter (Signed)
Pt called in stating she has been taking Prevogen for her memory, but has recently stopped due to the cost. She has noticed some changes in her memory, confusion, and stuttering since stopping the medication. She would like to see if there is anything else she could take that may be cheaper?

## 2021-04-24 NOTE — Progress Notes (Signed)
Paramedicine Encounter    Patient ID: Rhonda [name], female    DOB: 06/10/1950, 70 y.o.   MRN: [id number]  Went by [name]'s home today to correct potassium increase in her pill box as well as removing probenicid from per pill box. She was reminded of upcoming appointments including lab recheck for Friday. She agreed with plan. I will follow up in one week on Tuesday. Visit complete.   NEW POTASSIUM: 60MEQ MORNING  60MEQ NOON  40 MEQ EVENING   40MEQ EXTRA AT NOON ON TUESDAYS FOR METOLAZONE DAY.   ACTION: Home visit completed

## 2021-04-24 NOTE — Telephone Encounter (Signed)
Called patient and informed her per Dr. [name] of advice on supplements marketed for brain health. Patient became upset and was extremely hard to understand. Patients husband was joined in call via speaker phone with patient. Patients husband informed me that patient is frustrated that she is unable to remember anything and frustrated with her decline and does not want to do the memory testing because she feels she will fail. Patients husband stated that he will call us back if patient decides to cancel her neurocognitive test.

## 2021-04-24 NOTE — Addendum Note (Signed)
Encounter addended by: Joette Catching, PA-C on: 04/24/2021 3:28 PM  Actions taken: Clinical Note Signed

## 2021-04-25 ENCOUNTER — Other Ambulatory Visit (HOSPITAL_COMMUNITY): Payer: Medicare HMO

## 2021-04-27 NOTE — Procedures (Signed)
    Sleep Study Report  Patient Information Study Date: 04/24/21 Patient Name: Rhonda [name] Patient ID: [id number] Birth Date: 2050/09/09 Age: 70 Gender: Female Referring Physician: Glori Bickers, MD  TEST DESCRIPTION: Home sleep apnea testing was completed using the WatchPat, a Type 1 device, utilizing peripheral arterial tonometry (PAT), chest movement, actigraphy, pulse oximetry, pulse rate, body position and snore. AHI was calculated with apnea and hypopnea using valid sleep time as the denominator. RDI includes apneas, hypopneas, and RERAs. The data acquired and the scoring of sleep and all associated events were performed in accordance with the recommended standards and specifications as outlined in the AASM Manual for the Scoring of Sleep and Associated Events 2.2.0 (2015).  FINDINGS: 1. Mild Obstructive Sleep Apnea with AHI 10.9/hr. 2. No Central Sleep Apnea with pAHIc 1.5/hr. 3. Oxygen desaturations as low as 80%. 4. Moderate snoring was present. O2 sats were < 88% for 0.2 min. 5. Total sleep time was 2 hrs and 44 min. 6. 10.2% of total sleep time was spent in REM sleep. 7. Normal sleep onset latency at 11 min. 8. Prolonged REM sleep onset latency at 140 min. 9. Total awakenings were 17.  DIAGNOSIS: Mild Obstructive Sleep Apnea (G47.33)  RECOMMENDATIONS: 1. Clinical correlation of these findings is necessary. The decision to treat obstructive sleep apnea (OSA) is usually based on the presence of apnea symptoms or the presence of associated medical conditions such as Hypertension, Congestive Heart Failure, Atrial Fibrillation or Obesity. The most common symptoms of OSA are snoring, gasping for breath while sleeping, daytime sleepiness and fatigue.  2. Initiating apnea therapy is recommended given the presence of symptoms and/or associated conditions. Recommend proceeding with one of the following:   a. Auto-CPAP therapy with a pressure range of 5-20cm H2O.   b.  An oral appliance (OA) that can be obtained from certain dentists with expertise in sleep medicine. These are primarily of use in non-obese patients with mild and moderate disease.   c. An ENT consultation which may be useful to look for specific causes of obstruction and possible treatment options.   d. If patient is intolerant to PAP therapy, consider referral to ENT for evaluation for hypoglossal nerve stimulator.  3. Close follow-up is necessary to ensure success with CPAP or oral appliance therapy for maximum benefit .  4. A follow-up oximetry study on CPAP is recommended to assess the adequacy of therapy and determine the need for supplemental oxygen or the potential need for Bi-level therapy. An arterial blood gas to determine the adequacy of baseline ventilation and oxygenation should also be considered.  5. Healthy sleep recommendations include: adequate nightly sleep (normal 7-9 hrs/night), avoidance of caffeine after noon and alcohol near bedtime, and maintaining a sleep environment that is cool, dark and quiet.  6. Weight loss for overweight patients is recommended. Even modest amounts of weight loss can significantly improve the severity of sleep apnea.  7. Snoring recommendations include: weight loss where appropriate, side sleeping, and avoidance of alcohol before bed.  8. Operation of motor vehicle should be avoided when sleepy.  Signature: Electronically Signed: 04/27/21 Fransico Him, MD; [facility]; [place], American Board of Sleep Medicine

## 2021-04-28 ENCOUNTER — Telehealth: Payer: Self-pay | Admitting: *Deleted

## 2021-04-28 DIAGNOSIS — G4733 Obstructive sleep apnea (adult) (pediatric): Secondary | ICD-10-CM

## 2021-04-28 NOTE — Telephone Encounter (Signed)
-----   Message from Sueanne Margarita, MD sent at 04/27/2021 10:18 AM EST ----- Please let patient know that they have sleep apnea and recommend treating with CPAP.  Please order an auto CPAP from 4-15cm H2O with heated humidity and mask of choice.  Order overnight pulse ox on CPAP.  Followup with me in 6 weeks.

## 2021-04-29 ENCOUNTER — Other Ambulatory Visit (HOSPITAL_COMMUNITY): Payer: Self-pay

## 2021-04-29 ENCOUNTER — Other Ambulatory Visit: Payer: Self-pay | Admitting: Neurology

## 2021-04-29 NOTE — Progress Notes (Signed)
Paramedicine Encounter    Patient ID: Rhonda [name], female    DOB: Oct 09, 1950, 70 y.o.   MRN: [id number]   Patient Care Team: Rudean Curt as Social Worker (Licensed Clinical Social Worker) [name], [name], DO as Consulting Physician (Neurology)  Patient Active Problem List   Diagnosis Date Noted   Abnormal gait 06/11/2020   Adult failure to thrive syndrome 06/11/2020   Allergic rhinitis 06/11/2020   Anxiety 06/11/2020   Asthma 06/11/2020   Benign intracranial hypertension 06/11/2020   Body mass index (BMI) 45.0-49.9, adult ([place]) 06/11/2020   Bowel incontinence 06/11/2020   Cholelithiasis 06/11/2020   Chronic sinusitis 06/11/2020   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   Daytime somnolence 06/11/2020   Diarrhea of presumed infectious origin 06/11/2020   Edema 06/11/2020   Family history of malignant neoplasm of gastrointestinal tract 06/11/2020   Gout 06/11/2020   History of fall 06/11/2020   History of infectious disease 06/11/2020   Insomnia 06/11/2020   Irregular bowel habits 06/11/2020   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   Lumbar spondylosis with myelopathy 06/11/2020   Macrocytosis 06/11/2020   Mild recurrent major depression ([place]) 06/11/2020   Personal history of colonic polyps 06/11/2020   Personal history of malignant neoplasm of breast 06/11/2020   Recurrent falls 06/11/2020   Mixed anxiety and depressive disorder 06/11/2020   Irritable bowel syndrome 01/04/2020   Spinal stenosis of lumbar region 01/03/2020   Other symptoms and signs involving the musculoskeletal system 09/11/2019   Bilateral leg weakness 08/01/2019   Leg swelling 08/01/2019   Diabetes mellitus with neuropathy ([place]) 06/15/2019   Hypokalemia 11/08/2018   CKD (chronic kidney disease) stage 3, GFR 30-59 ml/min ([facility]) 11/08/2018   Orthostatic hypotension 07/28/2017   SVD (spontaneous vaginal delivery)    Peripheral neuropathy    On home oxygen therapy    Migraines    Left bundle branch block     Hypothyroidism    Hypertension    Hyperlipidemia    Heart murmur    GERD (gastroesophageal reflux disease)    Exertional shortness of breath    Major depressive disorder    Back pain    Arthritis    Generalized anxiety disorder    Anemia    Chronic respiratory failure ([place]) 09/14/2013   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   Morbid obesity ([place]) 40/98/1191   Chronic systolic heart failure ([place]) 10/27/2012   Endometrial polyp 01/20/2012   Malignant tumor of breast ([place]) 03/26/2011   Unspecified vitamin D deficiency 03/26/2011    Current Outpatient Medications:    acetaZOLAMIDE (DIAMOX) 125 MG tablet, Take 1 tablet (125 mg total) by mouth daily., Disp: 90 tablet, Rfl: 3   carvedilol (COREG) 3.125 MG tablet, TAKE 1 TABLET (3.125 MG) BY MOUTH TWICE DAILY WITH MEALS, Disp: 180 tablet, Rfl: 3   citalopram (CELEXA) 40 MG tablet, Take 40 mg by mouth daily., Disp: , Rfl:    clonazePAM (KLONOPIN) 0.5 MG tablet, , Disp: , Rfl:    dicyclomine (BENTYL) 20 MG tablet, Take 20 mg by mouth 3 (three) times daily before meals., Disp: , Rfl:    DULoxetine (CYMBALTA) 60 MG capsule, Take 60 mg by mouth daily., Disp: , Rfl:    fexofenadine (ALLEGRA) 180 MG tablet, Take 180 mg by mouth 2 (two) times daily., Disp: , Rfl:    fludrocortisone (FLORINEF) 0.1 MG tablet, Take 1 tablet (100 mcg total) by mouth 2 (two) times daily., Disp: 180 tablet, Rfl: 3   fluticasone (FLONASE) 50 MCG/ACT nasal  spray, , Disp: , Rfl:    gabapentin (NEURONTIN) 300 MG capsule, Take 1 capsule by mouth 3 (three) times daily., Disp: , Rfl:    gabapentin (NEURONTIN) 800 MG tablet, TAKE 1 TABLET AT BEDTIME AS NEEDED FOR NEUROPATHY, Disp: 30 tablet, Rfl: 0   gabapentin (NEURONTIN) 800 MG tablet, Take 800 mg by mouth at bedtime., Disp: , Rfl:    Ginkgo Biloba 120 MG CAPS, See admin instructions., Disp: , Rfl:    ipratropium (ATROVENT) 0.03 % nasal spray, Place 2 sprays into both nostrils 2 (two)  times daily., Disp: , Rfl:    levothyroxine (SYNTHROID) 88 MCG tablet, TAKE 1 TABLET(88 MCG) BY MOUTH DAILY BEFORE BREAKFAST, Disp: 90 tablet, Rfl: 3   metolazone (ZAROXOLYN) 2.5 MG tablet, TAKE 1 TABLET BY MOUTH EVERY TUESDAY, Disp: 12 tablet, Rfl: 3   midodrine (PROAMATINE) 10 MG tablet, TAKE 1 TABLET (10 MG TOTAL) BY MOUTH 3 (THREE) TIMES DAILY WITH MEALS., Disp: 270 tablet, Rfl: 3   mirtazapine (REMERON) 30 MG tablet, 1 tablet at bedtime., Disp: , Rfl:    montelukast (SINGULAIR) 10 MG tablet, Take 10 mg by mouth at bedtime., Disp: , Rfl: 1   Multiple Vitamin (MULTIVITAMIN) tablet, Take 1 tablet by mouth daily., Disp: , Rfl:    omeprazole (PRILOSEC) 40 MG capsule, Take 40 mg by mouth daily., Disp: , Rfl:    potassium chloride SA (KLOR-CON) 20 MEQ tablet, TAKE 2 TABLETS THREE TIMES DAILY. TAKE AN EXTRA 2 TABLETS ON DAYS YOU TAKE METOLAZONE, Disp: 572 tablet, Rfl: 0   simvastatin (ZOCOR) 10 MG tablet, Take 10 mg by mouth every evening., Disp: , Rfl:    spironolactone (ALDACTONE) 25 MG tablet, Take 1 tablet (25 mg total) by mouth every evening. Needs appt for further refills, Disp: 90 tablet, Rfl: 0   torsemide (DEMADEX) 20 MG tablet, Take 4 tablets (80 mg total) by mouth every morning AND 3 tablets (60 mg total) every evening., Disp: 630 tablet, Rfl: 3 Allergies  Allergen Reactions   Ceftin Anaphylaxis    Face and throat swell    Geodon [Ziprasidone Hcl] Hives   Lisinopril Other (See Comments)    angioedema   Shellfish Allergy Other (See Comments)    Gout exacerbation   Shellfish-Derived Products     Other reaction(s): Other   Ultram [Tramadol] Itching   Allopurinol Nausea Only and Other (See Comments)    weakness   Ativan [Lorazepam] Itching   Sulfa Antibiotics Itching   Ultram [Tramadol Hcl] Itching   Valium [Diazepam] Other (See Comments)    Patient states that diazepam doesn't relax, it has the opposite effect.     Social History   Socioeconomic History   Marital status:  Married    Spouse name: Not on file   Number of children: 2   Years of education: 30   Highest education level: Master's degree (e.g., MA, MS, MEng, MEd, MSW, MBA)  Occupational History   Occupation: retired  Tobacco Use   Smoking status: Former    Packs/day: 0.10    Years: 26.00    Pack years: 2.60    Types: Cigarettes    Quit date: 03/23/2021    Years since quitting: 0.1   Smokeless tobacco: Never  Vaping Use   Vaping Use: Never used  Substance and Sexual Activity   Alcohol use: No   Drug use: No   Sexual activity: Yes  Other Topics Concern   Not on file  Social History Narrative   Tobacco Use Cigarettes:  Former Smoker, Quit in 2008   No Alcohol   No recreational drug use   Diet: Regular/Low Carb   Exercise: None   Occupation: disabled   Education: Research officer, political party, masters   Children: 2   Firearms: No   Therapist, 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  Constitutional:      Appearance: Normal appearance. She is normal weight.  HENT:     Head: Normocephalic.     Nose: Nose normal.     Mouth/Throat:     Mouth: Mucous membranes are moist.     Pharynx: Oropharynx is clear.  Eyes:     Conjunctiva/sclera: Conjunctivae normal.     Pupils: Pupils are equal, round, and reactive to light.  Cardiovascular:     Rate and Rhythm: Normal rate and regular rhythm.     Pulses: Normal pulses.     Heart sounds: Normal heart sounds.  Pulmonary:     Effort: Pulmonary effort is normal.     Breath sounds: Normal breath sounds.  Abdominal:     General: Abdomen is flat.     Palpations: Abdomen is soft.  Musculoskeletal:        General: No  swelling. Normal range of motion.     Cervical back: Normal range of motion.     Right lower leg: No edema.     Left lower leg: No edema.  Skin:    General: Skin is warm and dry.     Capillary Refill: Capillary refill takes less than 2 seconds.  Neurological:     General: No focal deficit present.     Mental Status: She is alert. Mental status is at baseline.  Psychiatric:        Mood and Affect: Mood normal.    Arrived for home visit for [facility] who reports feeling okay, much better than last week. She states she has been trying to exercise some and eat better and her weight this week is down 5lbs. Vitals and assessment obtained as noted. No edema noted in lower legs. She denied dizziness, chest pain, shortness of breath. I reviewed medications and filled pill box accordingly. I gave Mrs. Offner her morning medications during our visit. Appointments confirmed, written down for confirmation.   Home visit complete. I will see Mrs. Kadrmas in one week.    -Mrs. Dusenbery also stated she completed the [facility] Study at home over the weekend.   REFILLS: -gabapentin -florinef   Future Appointments  Date Time Provider [place]  04/30/2021  2:45 PM Candyce Churn B, PT [facility] None  05/01/2021  3:45 PM [facility] LAB [facility] None  06/19/2021  3:00 PM [place] ECHO OP 1 [facility]  06/25/2021  1:40 PM Bensimhon, Shaune Pascal, MD [facility] None  07/10/2021  7:40 AM [facility] DEVICE REMOTES [facility]  07/31/2021  1:00 PM Hazle Coca, PhD [facility] None  07/31/2021  2:00 PM [facility] [place] [facility] None  08/07/2021 10:00 AM Hazle Coca, PhD [facility] None  08/11/2021  7:40 AM [facility] DEVICE REMOTES [facility]  09/10/2021  7:40 AM [facility] DEVICE REMOTES [facility]  [facility]  03/04/2022  8:50 AM Patel, Arvin Collard K, DO [facility] None     ACTION: Home visit completed

## 2021-04-30 ENCOUNTER — Ambulatory Visit: Payer: Medicare HMO | Attending: Family Medicine

## 2021-04-30 ENCOUNTER — Other Ambulatory Visit: Payer: Self-pay

## 2021-04-30 DIAGNOSIS — M6281 Muscle weakness (generalized): Secondary | ICD-10-CM | POA: Insufficient documentation

## 2021-04-30 DIAGNOSIS — R296 Repeated falls: Secondary | ICD-10-CM | POA: Insufficient documentation

## 2021-04-30 DIAGNOSIS — R262 Difficulty in walking, not elsewhere classified: Secondary | ICD-10-CM | POA: Insufficient documentation

## 2021-04-30 DIAGNOSIS — M545 Low back pain, unspecified: Secondary | ICD-10-CM | POA: Insufficient documentation

## 2021-04-30 DIAGNOSIS — G8929 Other chronic pain: Secondary | ICD-10-CM | POA: Insufficient documentation

## 2021-04-30 HISTORY — DX: Low back pain, unspecified: M54.50

## 2021-04-30 NOTE — Therapy (Signed)
[facility] @ [address] [place], [place], [address] Phone: [phone]   Fax:  [phone]  Physical Therapy Evaluation  Patient Details  Name: Rhonda [name] MRN: [id number] Date of Birth: January 28, 1951 Referring Provider (PT): Kandy Garrison, MD   Encounter Date: 04/30/2021   PT End of Session - 04/30/21 1527     Visit Number 1    Date for PT Re-Evaluation 06/25/21    Authorization Type [employer]    PT Start Time 5643    PT Stop Time 1532    PT Time Calculation (min) 47 min    Activity Tolerance Patient tolerated treatment well    Behavior During Therapy [facility] for tasks assessed/performed             Past Medical History:  Diagnosis Date   Anemia    Arthritis    Right knee   Asthma    Back pain    Disk problem   Breast cancer, stage 1 ([place]) 03/26/2011   Left; completed chemotherapy and radiation treatments   Cardiomyopathy    Chronic respiratory failure ([place]) 08/21/5186   Chronic systolic heart failure ([place])    a) NICM b) ECHO (03/2013) EF 20-25% c) ECHO (09/2013) EF 45-50%, grade I DD   CKD (chronic kidney disease) stage 3, GFR 30-59 ml/min ([place]) 09/07/6061   Complication of anesthesia    History of low blood pressure after surgery; attributed to lying flat   Diabetes mellitus    "diet controlled" (05/03/2013)   Exertional shortness of breath    Generalized anxiety disorder    GERD (gastroesophageal reflux disease)    Gout    Heart murmur    Hyperlipidemia    Hypertension    Hypokalemia 11/08/2018   Hypothyroidism    Left bundle branch block    s/p CRT-D (04/2013)   Major depressive disorder    Migraines    On home oxygen therapy    "2L suppose to be q night" (05/03/2013)   Orthostatic hypotension 07/28/2017   Peripheral neuropathy    Feet   SVD (spontaneous vaginal delivery)    x 2   Unspecified vitamin D deficiency 03/26/2011   Does not take meds    Past Surgical History:  Procedure  Laterality Date   BI-VENTRICULAR IMPLANTABLE CARDIOVERTER DEFIBRILLATOR N/A 05/03/2013   Procedure: BI-VENTRICULAR IMPLANTABLE CARDIOVERTER DEFIBRILLATOR  (CRT-D);  Surgeon: Evans Lance, MD;  Location: [facility] CATH LAB;  Service: Cardiovascular;  Laterality: N/A;   BI-VENTRICULAR IMPLANTABLE CARDIOVERTER DEFIBRILLATOR  (CRT-D)  05/03/2013   MDT CRTD implanted by Dr Lovena Le for non ischemic cardiomyopathy   BIOPSY  05/31/2018   Procedure: BIOPSY;  Surgeon: Ronnette Juniper, MD;  Location: [facility] ENDOS[facility];  Service: Gastroenterology;;   [place] N/A 04/11/2020   Procedure: [place];  Surgeon: Evans Lance, MD;  Location: [facility] CV LAB;  Service: Cardiovascular;  Laterality: N/A;   BREAST LUMPECTOMY Left 07/28/2010   CARDIAC CATHETERIZATION  2010   NORMAL CORONARY ARTERIES   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--11 SURGERIES     PT HAS REMOVABLE SPEECH BULB-TAKES IT OUT BEFORE HER SURGERY   COLONOSCOPY     COLONOSCOPY N/A 05/31/2018   Procedure: COLONOSCOPY;  Surgeon: Ronnette Juniper, MD;  Location: [facility] ENDOSCOPY;  Service: Gastroenterology;  Laterality: N/A;   HYSTEROSCOPY WITH D & C  01/20/2012   Procedure: DILATATION AND CURETTAGE /HYSTEROSCOPY;  Surgeon: Margarette Asal, MD;  Location: [facility];  Service:  Gynecology;  Laterality: N/A;  with trueclear   PORT-A-CATH REMOVAL  09/23/2011   Procedure: REMOVAL PORT-A-CATH;  Surgeon: Rolm Bookbinder, MD;  Location: [facility] ORS;  Service: General;  Laterality: N/A;  Port Removal   PORTACATH PLACEMENT  2012   TONSILLECTOMY  1960's   TOTAL KNEE ARTHROPLASTY  05/10/2012   Procedure: TOTAL KNEE ARTHROPLASTY;  Surgeon: Mauri Pole, MD;  Location: [facility] ORS;  Service: Orthopedics;  Laterality: Right;   WISDOM TOOTH EXTRACTION      There were no vitals filed for this visit.        [facility] PT Assessment - 04/30/21 0001       Assessment   Medical Diagnosis Repeated falls    Referring Provider (PT) Kandy Garrison, MD    Onset  Date/Surgical Date 04/24/20    Hand Dominance Right    Next MD Visit as needed      Precautions   Precautions Fall    Precaution Comments fell in August- always in kitchen      Restrictions   Weight Bearing Restrictions No      Balance Screen   Has the patient fallen in the past 6 months Yes    How many times? 1    Has the patient had a decrease in activity level because of a fear of falling?  Yes    Is the patient reluctant to leave their home because of a fear of falling?  Yes      [place] residence    Living Arrangements Spouse/significant other;Other (Comment)    Available Help at Discharge Family;Personal care attendant    Type of [place] to enter    Home Layout Two level;Able to live on main level with bedroom/bathroom    Alternate Level Stairs-Number of Steps 2    Alternate Level Stairs-Rails Right    Home Equipment Cane - quad      Prior Function   Level of Independence Independent with household mobility with device;Needs assistance with homemaking;Needs assistance with gait    Vocation Retired    Leisure eating out but has IBS so she is unable to still do this, would like to be able to walk her dog, working out at [employer] (special program for CHF)      Cognition   Overall Cognitive Status Within Functional Limits for tasks assessed    Attention Focused      Observation/Other Assessments   Observations Overweight very pleasant female      Sensation   Light Touch Appears Intact      Functional Tests   Functional tests Sit to Stand      Sit to Stand   Comments needs bilateral UE assist but able in one attempt      Posture/Postural Control   Posture/Postural Control No significant limitations      ROM / Strength   AROM / PROM / Strength AROM;Strength      AROM   Overall AROM  Within functional limits for tasks performed    Overall AROM Comments Bilateral UE and LE gross ROM WFL      Strength    Strength Assessment Site Hip;Knee    Right/Left Hip Right;Left      Bed Mobility   Bed Mobility Sit to Supine;Supine to Sit    Supine to Sit Independent    Sit to Supine Independent      Transfers   Transfers Sit to Stand;Stand to  Sit      Ambulation/Gait   Ambulation/Gait Yes    Ambulation/Gait Assistance 6: Modified independent (Device/Increase time)    Ambulation Distance (Feet) 50 Feet    Assistive device Other (Comment)   Normally uses quad cane but did not bring back into treatment area with her   Gait Pattern Step-to pattern;Decreased arm swing - right;Decreased arm swing - left;Decreased step length - right;Decreased step length - left;Decreased stride length;Decreased dorsiflexion - right;Decreased dorsiflexion - left;Right flexed knee in stance;Left flexed knee in stance;Antalgic;Lateral trunk lean to right;Lateral trunk lean to left;Poor foot clearance - left;Poor foot clearance - right    Stairs Yes    Stairs Assistance 6: Modified independent (Device/Increase time);5: Supervision    Stairs Assistance Details (indicate cue type and reason) 4    Stair Management Technique Step to pattern    Number of Stairs 4    Height of Stairs 6      Balance   Balance Assessed Yes      Static Standing Balance   Static Standing - Balance Support No upper extremity supported    Static Standing - Level of Assistance 7: Independent    Static Standing Balance -  Activities  Single Leg Stance - Right Leg;Single Leg Stance - Left Leg    Static Standing - Comment/# of Minutes able to do static standing without assistance for 30 sec,  unable to do SLS on either LE      Standardized Balance Assessment   Standardized Balance Assessment Timed Up and Go Test      Timed Up and Go Test   TUG Normal TUG    Normal TUG (seconds) 17.22      Functional Gait  Assessment   Gait assessed  No    Gait Level Surface Walks 20 ft, slow speed, abnormal gait pattern, evidence for imbalance or deviates  10-15 in outside of the 12 in walkway width. Requires more than 7 sec to ambulate 20 ft.    Change in Gait Speed Makes only minor adjustments to walking speed, or accomplishes a change in speed with significant gait deviations, deviates 10-15 in outside the 12 in walkway width, or changes speed but loses balance but is able to recover and continue walking.                        Objective measurements completed on examination: See above findings.                  PT Short Term Goals - 04/30/21 1541       PT SHORT TERM GOAL #1   Title Independence with initial HEP    Time 4    Period Weeks    Status New    Target Date 05/28/21      PT SHORT TERM GOAL #2   Title No falls first 4 weeks of PT    Time 4    Period Weeks    Status New    Target Date 05/28/21               PT Long Term Goals - 04/30/21 1542       PT LONG TERM GOAL #1   Title Independent with advanced HEP    Time 8    Period Weeks    Status New    Target Date 06/25/21      PT LONG TERM GOAL #2   Title TUG to be 12 sec or  under    Baseline 17.22    Time 8    Period Weeks    Status New    Target Date 06/25/21      PT LONG TERM GOAL #3   Title Patient to be able to walk on level surfaces for exercise    Time 8    Period Weeks    Status New    Target Date 06/25/21      PT LONG TERM GOAL #4   Title Patient to report low back pain at no greater than 2/10    Time 8    Period Weeks    Status New    Target Date 06/25/21                    Plan - 04/30/21 1533     Clinical Impression Statement Patient is referred to PT for frequent falls and low back pain.  She has hx of falling for past 2 years and multiple medical issues including CHF.  She confirms that all of her falls have been in the kitchen when she is standing trying to finish a task and her legs give way.  She is now sitting to do meal prep which has helped.  She feels she is taking more and more time to  complete tasks which is why she is unable to stand for very long.  She presents with hip and knee weakness.  Mild crepitus in left knee and unsteady gait.  She would benefit from skilled PT for LE strengthening, gait and balance training.  She hopes to be able to stand to do her meal prep and avoid any more falls.  She would also like to walk outdoors for exercise.    Personal Factors and Comorbidities Age;Comorbidity 1;Comorbidity 2;Fitness    Examination-Activity Limitations Lift;Bend;Squat;Stand    Examination-Participation Restrictions Meal Prep;Community Activity    Stability/Clinical Decision Making Evolving/Moderate complexity    Clinical Decision Making Moderate    Rehab Potential Fair    PT Frequency 1x / week    PT Duration 8 weeks    PT Treatment/Interventions ADLs/Self Care Home Management;Aquatic Therapy;Canalith Repostioning;Traction;Moist Heat;Iontophoresis 4mg /ml Dexamethasone;Electrical Stimulation;Cryotherapy;Ultrasound;DME Instruction;Gait training;Therapeutic exercise;Therapeutic activities;Functional mobility training;Stair training;Balance training;Neuromuscular re-education;Patient/family education;Manual techniques;Passive range of motion;Vestibular;Vasopneumatic Device;Taping;Energy conservation;Dry needling;Joint Manipulations    PT Next Visit Plan Begin hip strengthening and balance training.    PT Home Exercise Plan Unable to begin HEP due to time constraints    Consulted and Agree with Plan of Care Patient             Patient will benefit from skilled therapeutic intervention in order to improve the following deficits and impairments:  Abnormal gait, Decreased balance, Decreased endurance, Decreased mobility, Difficulty walking, Decreased activity tolerance, Decreased knowledge of use of DME, Decreased safety awareness, Decreased strength, Pain  Visit Diagnosis: Repeated falls - Plan: PT plan of care cert/re-cert  Chronic bilateral low back pain without sciatica  - Plan: PT plan of care cert/re-cert  Muscle weakness (generalized) - Plan: PT plan of care cert/re-cert  Difficulty in walking, not elsewhere classified - Plan: PT plan of care cert/re-cert    [facility] PT Assessment - 04/30/21 0001       Assessment   Medical Diagnosis Repeated falls    Referring Provider (PT) Kandy Garrison, MD    Onset Date/Surgical Date 04/24/20    Hand Dominance Right    Next MD Visit as needed      Precautions   Precautions Fall  Precaution Comments fell in August- always in kitchen      Restrictions   Weight Bearing Restrictions No      Balance Screen   Has the patient fallen in the past 6 months Yes    How many times? 1    Has the patient had a decrease in activity level because of a fear of falling?  Yes    Is the patient reluctant to leave their home because of a fear of falling?  Yes      [place] residence    Living Arrangements Spouse/significant other;Other (Comment)    Available Help at Discharge Family;Personal care attendant    Type of [place] to enter    Home Layout Two level;Able to live on main level with bedroom/bathroom    Alternate Level Stairs-Number of Steps 2    Alternate Level Stairs-Rails Right    Home Equipment Cane - quad      Prior Function   Level of Independence Independent with household mobility with device;Needs assistance with homemaking;Needs assistance with gait    Vocation Retired    Leisure eating out but has IBS so she is unable to still do this, would like to be able to walk her dog, working out at [employer] (special program for CHF)      Cognition   Overall Cognitive Status Within Functional Limits for tasks assessed    Attention Focused      Observation/Other Assessments   Observations Overweight very pleasant female      Sensation   Light Touch Appears Intact      Functional Tests   Functional tests Sit to Stand      Sit to Stand   Comments  needs bilateral UE assist but able in one attempt      Posture/Postural Control   Posture/Postural Control No significant limitations      ROM / Strength   AROM / PROM / Strength AROM;Strength      AROM   Overall AROM  Within functional limits for tasks performed    Overall AROM Comments Bilateral UE and LE gross ROM WFL      Strength   Strength Assessment Site Hip;Knee    Right/Left Hip Right;Left      Bed Mobility   Bed Mobility Sit to Supine;Supine to Sit    Supine to Sit Independent    Sit to Supine Independent      Transfers   Transfers Sit to Stand;Stand to Sit      Ambulation/Gait   Ambulation/Gait Yes    Ambulation/Gait Assistance 6: Modified independent (Device/Increase time)    Ambulation Distance (Feet) 50 Feet    Assistive device Other (Comment)   Normally uses quad cane but did not bring back into treatment area with her   Gait Pattern Step-to pattern;Decreased arm swing - right;Decreased arm swing - left;Decreased step length - right;Decreased step length - left;Decreased stride length;Decreased dorsiflexion - right;Decreased dorsiflexion - left;Right flexed knee in stance;Left flexed knee in stance;Antalgic;Lateral trunk lean to right;Lateral trunk lean to left;Poor foot clearance - left;Poor foot clearance - right    Stairs Yes    Stairs Assistance 6: Modified independent (Device/Increase time);5: Supervision    Stairs Assistance Details (indicate cue type and reason) 4    Stair Management Technique Step to pattern    Number of Stairs 4    Height of Stairs 6      Balance  Balance Assessed Yes      Static Standing Balance   Static Standing - Balance Support No upper extremity supported    Static Standing - Level of Assistance 7: Independent    Static Standing Balance -  Activities  Single Leg Stance - Right Leg;Single Leg Stance - Left Leg    Static Standing - Comment/# of Minutes able to do static standing without assistance for 30 sec,  unable to do SLS  on either LE      Standardized Balance Assessment   Standardized Balance Assessment Timed Up and Go Test      Timed Up and Go Test   TUG Normal TUG    Normal TUG (seconds) 17.22      Functional Gait  Assessment   Gait assessed  No    Gait Level Surface Walks 20 ft, slow speed, abnormal gait pattern, evidence for imbalance or deviates 10-15 in outside of the 12 in walkway width. Requires more than 7 sec to ambulate 20 ft.    Change in Gait Speed Makes only minor adjustments to walking speed, or accomplishes a change in speed with significant gait deviations, deviates 10-15 in outside the 12 in walkway width, or changes speed but loses balance but is able to recover and continue walking.              Problem List Patient Active Problem List   Diagnosis Date Noted   Lumbago without sciatica 04/30/2021   Abnormal gait 06/11/2020   Adult failure to thrive syndrome 06/11/2020   Allergic rhinitis 06/11/2020   Anxiety 06/11/2020   Asthma 06/11/2020   Benign intracranial hypertension 06/11/2020   Body mass index (BMI) 45.0-49.9, adult ([place]) 06/11/2020   Bowel incontinence 06/11/2020   Cholelithiasis 06/11/2020   Chronic sinusitis 06/11/2020   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   Daytime somnolence 06/11/2020   Diarrhea of presumed infectious origin 06/11/2020   Edema 06/11/2020   Family history of malignant neoplasm of gastrointestinal tract 06/11/2020   Gout 06/11/2020   History of fall 06/11/2020   History of infectious disease 06/11/2020   Insomnia 06/11/2020   Irregular bowel habits 06/11/2020   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   Lumbar spondylosis with myelopathy 06/11/2020   Macrocytosis 06/11/2020   Mild recurrent major depression ([place]) 06/11/2020   Personal history of colonic polyps 06/11/2020   Personal history of malignant neoplasm of breast 06/11/2020   Repeated falls 06/11/2020   Mixed anxiety and depressive disorder 06/11/2020   Irritable bowel syndrome 01/04/2020   Spinal  stenosis of lumbar region 01/03/2020   Other symptoms and signs involving the musculoskeletal system 09/11/2019   Bilateral leg weakness 08/01/2019   Leg swelling 08/01/2019   Diabetes mellitus with neuropathy ([place]) 06/15/2019   Hypokalemia 11/08/2018   CKD (chronic kidney disease) stage 3, GFR 30-59 ml/min ([facility]) 11/08/2018   Orthostatic hypotension 07/28/2017   SVD (spontaneous vaginal delivery)    Peripheral neuropathy    On home oxygen therapy    Migraines    Left bundle branch block    Hypothyroidism    Hypertension    Hyperlipidemia    Heart murmur    GERD (gastroesophageal reflux disease)    Exertional shortness of breath    Major depressive disorder    Back pain    Arthritis    Generalized anxiety disorder    Anemia    Chronic respiratory failure ([place]) 09/14/2013   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 Kumar Falwell, PT 12/07/223:47 PM   [facility] @ [address] [place], [place], [address] Phone: [phone]   Fax:  [phone]  Name: ANGELETTE GANUS MRN: [id number] Date of Birth: 1951/03/03

## 2021-04-30 NOTE — Telephone Encounter (Signed)
The patient has been notified of the result and verbalized understanding.  All questions (if any) were answered. Rhonda [name], [place] 04/30/2021 5:23 PM    Upon patient request DME selection is [facility] Patient understands he will be contacted by [name] to set up his cpap. Patient understands to call if [name] does not contact him with new setup in a timely manner. Patient understands they will be called once confirmation has been received from [facility] that they have received their new machine to schedule 10 week follow up appointment.   [name] notified of new cpap order  Please add to airview Patient was grateful for the call and thanked me

## 2021-05-01 ENCOUNTER — Ambulatory Visit (HOSPITAL_COMMUNITY)
Admission: RE | Admit: 2021-05-01 | Discharge: 2021-05-01 | Disposition: A | Discharge status: Home / Self Care | Payer: Medicare HMO | Source: Ambulatory Visit | Attending: Cardiology | Admitting: Cardiology

## 2021-05-01 DIAGNOSIS — I5022 Chronic systolic (congestive) heart failure: Secondary | ICD-10-CM | POA: Diagnosis not present

## 2021-05-01 LAB — BASIC METABOLIC PANEL
Anion gap: 14 (ref 5–15)
BUN: 45 mg/dL — ABNORMAL HIGH (ref 8–23)
CO2: 30 mmol/L (ref 22–32)
Calcium: 9.5 mg/dL (ref 8.9–10.3)
Chloride: 96 mmol/L — ABNORMAL LOW (ref 98–111)
Creatinine, Ser: 1.81 mg/dL — ABNORMAL HIGH (ref 0.44–1.00)
GFR, Estimated: 30 mL/min — ABNORMAL LOW (ref >60)
Glucose, Bld: 148 mg/dL — ABNORMAL HIGH (ref 70–99)
Potassium: 3.7 mmol/L (ref 3.5–5.1)
Sodium: 140 mmol/L (ref 135–145)

## 2021-05-05 ENCOUNTER — Telehealth (HOSPITAL_COMMUNITY): Payer: Self-pay

## 2021-05-05 NOTE — Telephone Encounter (Signed)
Spoke to Mrs. Buchler who agreed to home visit tomorrow around 10-10:30. Call complete.

## 2021-05-06 ENCOUNTER — Other Ambulatory Visit (HOSPITAL_COMMUNITY): Payer: Self-pay

## 2021-05-06 NOTE — Progress Notes (Signed)
Paramedicine Encounter    Patient ID: Rhonda [name], female    DOB: 31-Jul-1950, 70 y.o.   MRN: [id number]   Arrived for home visit for Jennfer who reports she is feeling okay. Chyla states she is no longer having trouble coughing or difficulty breathing this week. Marcelline denied chest pain, dizziness or swelling. Diala did report having some trouble sleeping. She stated she feels like she isn't getting much sleep at night and is having some increased depression and anxiety. I advised her to call her insurance to seek some covered therapist for her plan. She agreed with plan.   Weight this week up 1 lb. (225lbs) Last weeks weight was 224lbs -She isn't weighing everyday, I reminded her to do so every day. She agreed.   We reviewed meds, I filled one week of medications in pill box.   No med changes from labs last week.   Vitals obtained and assessment. No lower leg swelling or edema. Lungs clear.   We reviewed appointments and confirmed home visit for next week.   Refills: -Fludrocortistone ([facility]) -Gabapentin 300mg  ([facility])           Patient Care Team: [name], LCSW as Social Worker (Licensed Clinical Social Worker) [name], [name], DO as Consulting Physician (Neurology)  Patient Active Problem List   Diagnosis Date Noted   Lumbago without sciatica 04/30/2021   Abnormal gait 06/11/2020   Adult failure to thrive syndrome 06/11/2020   Allergic rhinitis 06/11/2020   Anxiety 06/11/2020   Asthma 06/11/2020   Benign intracranial hypertension 06/11/2020   Body mass index (BMI) 45.0-49.9, adult ([place]) 06/11/2020   Bowel incontinence 06/11/2020   Cholelithiasis 06/11/2020   Chronic sinusitis 06/11/2020   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   Daytime somnolence 06/11/2020   Diarrhea of presumed infectious origin 06/11/2020   Edema 06/11/2020   Family history of malignant neoplasm of gastrointestinal tract 06/11/2020   Gout 06/11/2020   History of fall 06/11/2020    History of infectious disease 06/11/2020   Insomnia 06/11/2020   Irregular bowel habits 06/11/2020   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   Lumbar spondylosis with myelopathy 06/11/2020   Macrocytosis 06/11/2020   Mild recurrent major depression ([place]) 06/11/2020   Personal history of colonic polyps 06/11/2020   Personal history of malignant neoplasm of breast 06/11/2020   Repeated falls 06/11/2020   Mixed anxiety and depressive disorder 06/11/2020   Irritable bowel syndrome 01/04/2020   Spinal stenosis of lumbar region 01/03/2020   Other symptoms and signs involving the musculoskeletal system 09/11/2019   Bilateral leg weakness 08/01/2019   Leg swelling 08/01/2019   Diabetes mellitus with neuropathy ([place]) 06/15/2019   Hypokalemia 11/08/2018   CKD (chronic kidney disease) stage 3, GFR 30-59 ml/min (HCC) 11/08/2018   Orthostatic hypotension 07/28/2017   SVD (spontaneous vaginal delivery)    Peripheral neuropathy    On home oxygen therapy    Migraines    Left bundle branch block    Hypothyroidism    Hypertension    Hyperlipidemia    Heart murmur    GERD (gastroesophageal reflux disease)    Exertional shortness of breath    Major depressive disorder    Back pain    Arthritis    Generalized anxiety disorder    Anemia    Chronic respiratory failure ([place]) 09/14/2013   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   Morbid obesity ([place]) 47/82/9562   Chronic systolic heart failure ([place]) 10/27/2012   Endometrial polyp 01/20/2012   Malignant tumor of  breast ([place]) 03/26/2011   Unspecified vitamin D deficiency 03/26/2011    Current Outpatient Medications:    acetaZOLAMIDE (DIAMOX) 125 MG tablet, Take 1 tablet (125 mg total) by mouth daily., Disp: 90 tablet, Rfl: 3   carvedilol (COREG) 3.125 MG tablet, TAKE 1 TABLET (3.125 MG) BY MOUTH TWICE DAILY WITH MEALS, Disp: 180 tablet, Rfl: 3   citalopram (CELEXA) 40 MG tablet, Take 40 mg by mouth daily., Disp: ,  Rfl:    clonazePAM (KLONOPIN) 0.5 MG tablet, , Disp: , Rfl:    dicyclomine (BENTYL) 20 MG tablet, Take 20 mg by mouth 3 (three) times daily before meals., Disp: , Rfl:    DULoxetine (CYMBALTA) 60 MG capsule, Take 60 mg by mouth daily., Disp: , Rfl:    fexofenadine (ALLEGRA) 180 MG tablet, Take 180 mg by mouth 2 (two) times daily. (Patient not taking: Reported on 04/29/2021), Disp: , Rfl:    fludrocortisone (FLORINEF) 0.1 MG tablet, Take 1 tablet (100 mcg total) by mouth 2 (two) times daily., Disp: 180 tablet, Rfl: 3   fluticasone (FLONASE) 50 MCG/ACT nasal spray, , Disp: , Rfl:    gabapentin (NEURONTIN) 300 MG capsule, Take 1 capsule by mouth 3 (three) times daily., Disp: , Rfl:    gabapentin (NEURONTIN) 800 MG tablet, Take 800 mg by mouth at bedtime., Disp: , Rfl:    gabapentin (NEURONTIN) 800 MG tablet, TAKE 1 TABLET AT BEDTIME AS NEEDED FOR NEUROPATHY, Disp: 30 tablet, Rfl: 1   Ginkgo Biloba 120 MG CAPS, See admin instructions. (Patient not taking: Reported on 04/29/2021), Disp: , Rfl:    ipratropium (ATROVENT) 0.03 % nasal spray, Place 2 sprays into both nostrils 2 (two) times daily., Disp: , Rfl:    levothyroxine (SYNTHROID) 88 MCG tablet, TAKE 1 TABLET(88 MCG) BY MOUTH DAILY BEFORE BREAKFAST, Disp: 90 tablet, Rfl: 3   metolazone (ZAROXOLYN) 2.5 MG tablet, TAKE 1 TABLET BY MOUTH EVERY TUESDAY, Disp: 12 tablet, Rfl: 3   midodrine (PROAMATINE) 10 MG tablet, TAKE 1 TABLET (10 MG TOTAL) BY MOUTH 3 (THREE) TIMES DAILY WITH MEALS., Disp: 270 tablet, Rfl: 3   mirtazapine (REMERON) 30 MG tablet, 1 tablet at bedtime., Disp: , Rfl:    montelukast (SINGULAIR) 10 MG tablet, Take 10 mg by mouth at bedtime., Disp: , Rfl: 1   Multiple Vitamin (MULTIVITAMIN) tablet, Take 1 tablet by mouth daily., Disp: , Rfl:    omeprazole (PRILOSEC) 40 MG capsule, Take 40 mg by mouth daily., Disp: , Rfl:    potassium chloride SA (KLOR-CON) 20 MEQ tablet, TAKE 2 TABLETS THREE TIMES DAILY. TAKE AN EXTRA 2 TABLETS ON DAYS YOU  TAKE METOLAZONE, Disp: 572 tablet, Rfl: 0   simvastatin (ZOCOR) 10 MG tablet, Take 10 mg by mouth every evening., Disp: , Rfl:    spironolactone (ALDACTONE) 25 MG tablet, Take 1 tablet (25 mg total) by mouth every evening. Needs appt for further refills, Disp: 90 tablet, Rfl: 0   torsemide (DEMADEX) 20 MG tablet, Take 4 tablets (80 mg total) by mouth every morning AND 3 tablets (60 mg total) every evening., Disp: 630 tablet, Rfl: 3 Allergies  Allergen Reactions   Ceftin Anaphylaxis    Face and throat swell    Geodon [Ziprasidone Hcl] Hives   Lisinopril Other (See Comments)    angioedema   Shellfish Allergy Other (See Comments)    Gout exacerbation   Shellfish-Derived Products     Other reaction(s): Other   Ultram [Tramadol] Itching   Allopurinol Nausea Only and Other (See  Comments)    weakness   Ativan [Lorazepam] Itching   Sulfa Antibiotics Itching   Ultram [Tramadol Hcl] Itching   Valium [Diazepam] Other (See Comments)    Patient states that diazepam doesn't relax, it has the opposite effect.     Social History   Socioeconomic History   Marital status: Married    Spouse name: Not on file   Number of children: 2   Years of education: 25   Highest education level: Master's degree (e.g., MA, MS, MEng, MEd, MSW, MBA)  Occupational History   Occupation: retired  Tobacco Use   Smoking status: Former    Packs/day: 0.10    Years: 26.00    Pack years: 2.60    Types: Cigarettes    Quit date: 03/23/2021    Years since quitting: 0.1   Smokeless tobacco: Never  Vaping Use   Vaping Use: Never used  Substance and Sexual Activity   Alcohol use: No   Drug use: No   Sexual activity: Yes  Other Topics Concern   Not on file  Social History Narrative   Tobacco Use Cigarettes: Former Smoker, Quit in 2008   No Alcohol   No recreational drug use   Diet: Regular/Low Carb   Exercise: None   Occupation: disabled   Education: Research officer, political party, masters   Children: 2   Firearms: No    Therapist, 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  Constitutional:      Appearance: Normal appearance. She is normal weight.  HENT:     Head: Normocephalic.     Nose: Nose normal.     Mouth/Throat:     Mouth: Mucous membranes are moist.     Pharynx: Oropharynx is clear.  Eyes:     Conjunctiva/sclera: Conjunctivae normal.     Pupils: Pupils are equal, round, and reactive to light.  Cardiovascular:     Rate and Rhythm: Normal rate and regular rhythm.     Pulses: Normal pulses.     Heart sounds: Normal heart sounds.  Pulmonary:     Effort: Pulmonary effort is normal.     Breath sounds: Normal breath sounds.  Abdominal:     General: Abdomen is flat.     Palpations: Abdomen is soft.  Musculoskeletal:        General: No swelling. Normal range of motion.     Cervical back: Normal range of motion.     Right lower leg: No edema.     Left lower leg: No edema.  Skin:    General: Skin is warm and dry.     Capillary Refill: Capillary refill takes less than 2 seconds.  Neurological:     General: No focal deficit present.     Mental Status: She is alert. Mental status is at baseline.  Psychiatric:        Mood and Affect: Mood normal.        Future Appointments  Date Time Provider [place]  05/07/2021  7:00 AM [facility] DEVICE REMOTES [facility]  05/07/2021  2:45 PM Fields, Marveen Reeks, PT [facility] None  05/14/2021  2:45 PM Candyce Churn B, PT [facility] None  05/21/2021  2:45 PM Candyce Churn B, PT [facility] None  05/28/2021  2:45 PM Garnetta Buddy, Craig Guess, PT [facility] None  06/04/2021  2:45 PM Candyce Churn B, PT  [facility] None  06/11/2021  2:45 PM Candyce Churn B, PT [facility] None  06/18/2021  2:45 PM Candyce Churn B, PT [facility] None  06/19/2021  3:00 PM [facility] ECHO OP 1 [facility]  06/25/2021  1:40 PM Bensimhon, Shaune Pascal, MD [facility] None  07/10/2021  7:40 AM [facility] DEVICE REMOTES [facility]  07/31/2021  1:00 PM Hazle Coca, PhD [facility] None  07/31/2021  2:00 PM LBN- NEUROPSYCH TECH [facility] None  08/07/2021 10:00 AM Hazle Coca, PhD [facility] None  08/11/2021  7:40 AM [facility] DEVICE REMOTES [facility]  09/10/2021  7:40 AM [facility] DEVICE REMOTES [facility]  03/04/2022  8:50 AM Patel, Arvin Collard K, DO [facility] None     ACTION: Home visit completed

## 2021-05-07 ENCOUNTER — Ambulatory Visit (INDEPENDENT_AMBULATORY_CARE_PROVIDER_SITE_OTHER): Payer: Medicare HMO

## 2021-05-07 DIAGNOSIS — I5022 Chronic systolic (congestive) heart failure: Secondary | ICD-10-CM | POA: Diagnosis not present

## 2021-05-07 DIAGNOSIS — Z95810 Presence of automatic (implantable) cardiac defibrillator: Secondary | ICD-10-CM | POA: Diagnosis not present

## 2021-05-08 DIAGNOSIS — E039 Hypothyroidism, unspecified: Secondary | ICD-10-CM | POA: Diagnosis not present

## 2021-05-08 DIAGNOSIS — R197 Diarrhea, unspecified: Secondary | ICD-10-CM | POA: Diagnosis not present

## 2021-05-08 DIAGNOSIS — N1832 Chronic kidney disease, stage 3b: Secondary | ICD-10-CM | POA: Diagnosis not present

## 2021-05-13 ENCOUNTER — Other Ambulatory Visit (HOSPITAL_COMMUNITY): Payer: Self-pay

## 2021-05-13 NOTE — Progress Notes (Signed)
Paramedicine Encounter    Patient ID: Rhonda [name], female    DOB: [birth year], 70 y.o.   MRN: [id number]   Arrived for home visit for Rhonda [name] who reports she is feeling okay today with no complaints of shortness of breath, dizziness, or chest pain. She has been compliant with medications over the last week and already took scheduled medications today.   Vitals and assessment as noted in report. Weight up 2 lbs this week. Rhonda [name] reports eating soup and drinking tea. I reminded her of the low sodium encouraged diet. She agreed.  Medications were reviewed and confirmed. Pill box filled accordingly.   We reviewed upcoming appointments and same were recorded on her calendar.   Home visit complete. I will see Rhonda [name] in one week.   Refills: NONE   Patient Care Team: [name], LCSW as Social Worker (Licensed Clinical Social Worker) [name], [name], DO as Consulting Physician (Neurology)  Patient Active Problem List   Diagnosis Date Noted   Lumbago without sciatica 04/30/2021   Abnormal gait 06/11/2020   Adult failure to thrive syndrome 06/11/2020   Allergic rhinitis 06/11/2020   Anxiety 06/11/2020   Asthma 06/11/2020   Benign intracranial hypertension 06/11/2020   Body mass index (BMI) 45.0-49.9, adult ([place]) 06/11/2020   Bowel incontinence 06/11/2020   Cholelithiasis 06/11/2020   Chronic sinusitis 06/11/2020   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   Daytime somnolence 06/11/2020   Diarrhea of presumed infectious origin 06/11/2020   Edema 06/11/2020   Family history of malignant neoplasm of gastrointestinal tract 06/11/2020   Gout 06/11/2020   History of fall 06/11/2020   History of infectious disease 06/11/2020   Insomnia 06/11/2020   Irregular bowel habits 06/11/2020   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   Lumbar spondylosis with myelopathy 06/11/2020   Macrocytosis 06/11/2020   Mild recurrent major depression ([place]) 06/11/2020   Personal history of colonic polyps 06/11/2020   Personal  history of malignant neoplasm of breast 06/11/2020   Repeated falls 06/11/2020   Mixed anxiety and depressive disorder 06/11/2020   Irritable bowel syndrome 01/04/2020   Spinal stenosis of lumbar region 01/03/2020   Other symptoms and signs involving the musculoskeletal system 09/11/2019   Bilateral leg weakness 08/01/2019   Leg swelling 08/01/2019   Diabetes mellitus with neuropathy ([place]) 06/15/2019   Hypokalemia 11/08/2018   CKD (chronic kidney disease) stage 3, GFR 30-59 ml/min (HCC) 11/08/2018   Orthostatic hypotension 07/28/2017   SVD (spontaneous vaginal delivery)    Peripheral neuropathy    On home oxygen therapy    Migraines    Left bundle branch block    Hypothyroidism    Hypertension    Hyperlipidemia    Heart murmur    GERD (gastroesophageal reflux disease)    Exertional shortness of breath    Major depressive disorder    Back pain    Arthritis    Generalized anxiety disorder    Anemia    Chronic respiratory failure ([place]) 09/14/2013   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   Morbid obesity ([place]) 19/62/2297   Chronic systolic heart failure ([place]) 10/27/2012   Endometrial polyp 01/20/2012   Malignant tumor of breast ([place]) 03/26/2011   Unspecified vitamin D deficiency 03/26/2011    Current Outpatient Medications:    acetaZOLAMIDE (DIAMOX) 125 MG tablet, Take 1 tablet (125 mg total) by mouth daily., Disp: 90 tablet, Rfl: 3   carvedilol (COREG) 3.125 MG tablet, TAKE 1 TABLET (3.125 MG) BY MOUTH TWICE DAILY WITH MEALS, Disp: 180 tablet, Rfl:  3   citalopram (CELEXA) 40 MG tablet, Take 40 mg by mouth daily., Disp: , Rfl:    clonazePAM (KLONOPIN) 0.5 MG tablet, , Disp: , Rfl:    dicyclomine (BENTYL) 20 MG tablet, Take 20 mg by mouth 3 (three) times daily before meals., Disp: , Rfl:    DULoxetine (CYMBALTA) 60 MG capsule, Take 60 mg by mouth daily., Disp: , Rfl:    fexofenadine (ALLEGRA) 180 MG tablet, Take 180 mg by mouth 2 (two)  times daily. (Patient not taking: Reported on 04/29/2021), Disp: , Rfl:    fludrocortisone (FLORINEF) 0.1 MG tablet, Take 1 tablet (100 mcg total) by mouth 2 (two) times daily., Disp: 180 tablet, Rfl: 3   fluticasone (FLONASE) 50 MCG/ACT nasal spray, , Disp: , Rfl:    gabapentin (NEURONTIN) 300 MG capsule, Take 1 capsule by mouth 3 (three) times daily., Disp: , Rfl:    gabapentin (NEURONTIN) 800 MG tablet, Take 800 mg by mouth at bedtime., Disp: , Rfl:    gabapentin (NEURONTIN) 800 MG tablet, TAKE 1 TABLET AT BEDTIME AS NEEDED FOR NEUROPATHY, Disp: 30 tablet, Rfl: 1   Ginkgo Biloba 120 MG CAPS, See admin instructions. (Patient not taking: Reported on 04/29/2021), Disp: , Rfl:    ipratropium (ATROVENT) 0.03 % nasal spray, Place 2 sprays into both nostrils 2 (two) times daily., Disp: , Rfl:    levothyroxine (SYNTHROID) 88 MCG tablet, TAKE 1 TABLET(88 MCG) BY MOUTH DAILY BEFORE BREAKFAST, Disp: 90 tablet, Rfl: 3   metolazone (ZAROXOLYN) 2.5 MG tablet, TAKE 1 TABLET BY MOUTH EVERY TUESDAY, Disp: 12 tablet, Rfl: 3   midodrine (PROAMATINE) 10 MG tablet, TAKE 1 TABLET (10 MG TOTAL) BY MOUTH 3 (THREE) TIMES DAILY WITH MEALS., Disp: 270 tablet, Rfl: 3   mirtazapine (REMERON) 30 MG tablet, 1 tablet at bedtime., Disp: , Rfl:    montelukast (SINGULAIR) 10 MG tablet, Take 10 mg by mouth at bedtime., Disp: , Rfl: 1   Multiple Vitamin (MULTIVITAMIN) tablet, Take 1 tablet by mouth daily., Disp: , Rfl:    omeprazole (PRILOSEC) 40 MG capsule, Take 40 mg by mouth daily., Disp: , Rfl:    potassium chloride SA (KLOR-CON) 20 MEQ tablet, TAKE 2 TABLETS THREE TIMES DAILY. TAKE AN EXTRA 2 TABLETS ON DAYS YOU TAKE METOLAZONE, Disp: 572 tablet, Rfl: 0   simvastatin (ZOCOR) 10 MG tablet, Take 10 mg by mouth every evening., Disp: , Rfl:    spironolactone (ALDACTONE) 25 MG tablet, Take 1 tablet (25 mg total) by mouth every evening. Needs appt for further refills, Disp: 90 tablet, Rfl: 0   torsemide (DEMADEX) 20 MG tablet, Take 4  tablets (80 mg total) by mouth every morning AND 3 tablets (60 mg total) every evening., Disp: 630 tablet, Rfl: 3 Allergies  Allergen Reactions   Ceftin Anaphylaxis    Face and throat swell    Geodon [Ziprasidone Hcl] Hives   Lisinopril Other (See Comments)    angioedema   Shellfish Allergy Other (See Comments)    Gout exacerbation   Shellfish-Derived Products     Other reaction(s): Other   Ultram [Tramadol] Itching   Allopurinol Nausea Only and Other (See Comments)    weakness   Ativan [Lorazepam] Itching   Sulfa Antibiotics Itching   Ultram [Tramadol Hcl] Itching   Valium [Diazepam] Other (See Comments)    Patient states that diazepam doesn't relax, it has the opposite effect.     Social History   Socioeconomic History   Marital status: Married  Spouse name: Not on file   Number of children: 2   Years of education: 65   Highest education level: Master's degree (e.g., MA, MS, MEng, MEd, MSW, MBA)  Occupational History   Occupation: retired  Tobacco Use   Smoking status: Former    Packs/day: 0.10    Years: 26.00    Pack years: 2.60    Types: Cigarettes    Quit date: 03/23/2021    Years since quitting: 0.1   Smokeless tobacco: Never  Vaping Use   Vaping Use: Never used  Substance and Sexual Activity   Alcohol use: No   Drug use: No   Sexual activity: Yes  Other Topics Concern   Not on file  Social History Narrative   Tobacco Use Cigarettes: Former Smoker, Quit in 2008   No Alcohol   No recreational drug use   Diet: Regular/Low Carb   Exercise: None   Occupation: disabled   Education: Research officer, political party, masters   Children: 2   Firearms: No   Therapist, art Use: 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  Constitutional:      Appearance: Normal appearance. She is obese.  HENT:     Head: Normocephalic.     Nose: Rhinorrhea present.     Mouth/Throat:     Mouth: Mucous membranes are moist.     Pharynx: Oropharynx is clear.  Eyes:     Conjunctiva/sclera: Conjunctivae normal.     Pupils: Pupils are equal, round, and reactive to light.  Cardiovascular:     Rate and Rhythm: Normal rate and regular rhythm.     Pulses: Normal pulses.     Heart sounds: Normal heart sounds.  Pulmonary:     Effort: Pulmonary effort is normal.  Abdominal:     General: Abdomen is flat.     Palpations: Abdomen is soft.  Musculoskeletal:        General: No swelling. Normal range of motion.     Cervical back: Normal range of motion.     Right lower leg: No edema.     Left lower leg: No edema.  Skin:    General: Skin is warm and dry.     Capillary Refill: Capillary refill takes less than 2 seconds.  Neurological:     General: No focal deficit present.     Mental Status: She is alert. Mental status is at baseline.  Psychiatric:        Mood and Affect: Mood normal.        Future Appointments  Date Time Provider [place]  05/14/2021  2:45 PM Candyce Churn B, PT [facility] None  05/21/2021  2:45 PM Candyce Churn B, PT [facility] None  05/28/2021  2:45 PM Takacs, Craig Guess, PT [facility] None  06/04/2021  2:45 PM Candyce Churn B, PT [facility] None  06/11/2021  2:45 PM Candyce Churn B, PT [facility] None  06/18/2021  2:45 PM Candyce Churn B, PT [facility] None  06/19/2021  3:00 PM MC ECHO OP 1 [facility]  06/25/2021  1:40 PM Bensimhon, Shaune Pascal, MD [facility] None  07/10/2021  7:40 AM [facility] DEVICE REMOTES [facility]  07/31/2021  1:00 PM Hazle Coca, PhD [facility] None  07/31/2021  2:00 PM LBN- [place] [facility] None  08/07/2021 10:00 AM Hazle Coca, PhD [facility] None  08/11/2021  7:40 AM  [facility] DEVICE REMOTES [facility]  09/10/2021  7:40 AM [facility] DEVICE REMOTES [facility]  03/04/2022  8:50 AM Patel, Arvin Collard K, DO [facility] None     ACTION: Home visit completed

## 2021-05-13 NOTE — Progress Notes (Signed)
EPIC Encounter for ICM Monitoring  Patient Name: Rhonda [name] is a 70 y.o. female Date: 05/13/2021 Primary Care Physican: No primary care provider on file. Primary Cardiologist: [place] Electrophysiologist: Santina Evans Pacing: 98.6%   05/06/2021  Weight: 224 lbs      Transmission reviewed.    Optivol thoracic impedance suggesting normal fluid levels.    Prescribed:  Patient participating in paramedicine program to assist with meds. Torsemide 20 mg 4 tablets (80 mg total) by mouth every morning and 3 tablets (60 mg total) every evening.   Potassium  20 meq 2 tablets (40 mEq total) by mouth 3 (three) times daily.  Take extra 2 tablets on days you take Metolazone. Metolazone 2.5mg  1 tablet every Tuesday. Spironolactone 25 mg take 1 tablet by mouth every evening.   Labs: 05/01/2022 Creatinine 1.81, BUN 45, Potassium 3.7, Sodium 140, GFR 30 04/23/2021 Creatinine 1.71, BUN 33, Potassium 2.9, Sodium 137, GFR 32 0/26/2022 Creatinine 1.58, BUN 29, Potassium 3.9, Sodium 138, GFR 35, BNP 68.6 A complete set of results can be found in Results Review.   Recommendations:  No changes   Follow-up plan: ICM clinic phone appointment on 06/09/2021.   91 day device clinic remote transmission 07/10/2021.     EP/Cardiology Office Visits:  Recall 09/10/2020 with Dr Haroldine Laws.  Recall 07/26/2021 with Dr Lovena Le.     Copy of ICM check sent to Dr. Lovena Le.     3 month ICM trend: 05/05/2021.    12-14 Month ICM trend:       [name], RN 05/13/2021 8:18 AM

## 2021-05-14 ENCOUNTER — Other Ambulatory Visit: Payer: Self-pay

## 2021-05-14 ENCOUNTER — Ambulatory Visit: Payer: Medicare HMO

## 2021-05-14 DIAGNOSIS — R262 Difficulty in walking, not elsewhere classified: Secondary | ICD-10-CM | POA: Diagnosis not present

## 2021-05-14 DIAGNOSIS — R296 Repeated falls: Secondary | ICD-10-CM | POA: Diagnosis not present

## 2021-05-14 DIAGNOSIS — M6281 Muscle weakness (generalized): Secondary | ICD-10-CM

## 2021-05-14 DIAGNOSIS — G8929 Other chronic pain: Secondary | ICD-10-CM | POA: Diagnosis not present

## 2021-05-14 DIAGNOSIS — M545 Low back pain, unspecified: Secondary | ICD-10-CM | POA: Diagnosis not present

## 2021-05-14 NOTE — Therapy (Addendum)
[facility] @ [place], [place], [address] Phone: [phone]   Fax:  [phone]  Physical Therapy Treatment  Patient Details  Name: Rhonda [name] MRN: [id number] Date of Birth: Jan 20, 1951 Referring Provider (PT): Kandy Garrison, MD   Encounter Date: 05/14/2021   PT End of Session - 05/14/21 1513     Visit Number 2    Date for PT Re-Evaluation 06/25/21    Authorization Type [employer]    Authorization Time Period 04-30-21 to 06-25-21    Authorization - Visit Number 2    Authorization - Number of Visits 12    Progress Note Due on Visit 12    PT Start Time 1445    PT Stop Time 1523    PT Time Calculation (min) 38 min    Activity Tolerance Patient tolerated treatment well    Behavior During Therapy [facility] for tasks assessed/performed             Past Medical History:  Diagnosis Date   Anemia    Arthritis    Right knee   Asthma    Back pain    Disk problem   Breast cancer, stage 1 ([place]) 03/26/2011   Left; completed chemotherapy and radiation treatments   Cardiomyopathy    Chronic respiratory failure ([place]) 7/65/4650   Chronic systolic heart failure ([place])    a) NICM b) ECHO (03/2013) EF 20-25% c) ECHO (09/2013) EF 45-50%, grade I DD   CKD (chronic kidney disease) stage 3, GFR 30-59 ml/min ([place]) 3/54/6568   Complication of anesthesia    History of low blood pressure after surgery; attributed to lying flat   Diabetes mellitus    "diet controlled" (05/03/2013)   Exertional shortness of breath    Generalized anxiety disorder    GERD (gastroesophageal reflux disease)    Gout    Heart murmur    Hyperlipidemia    Hypertension    Hypokalemia 11/08/2018   Hypothyroidism    Left bundle branch block    s/p CRT-D (04/2013)   Major depressive disorder    Migraines    On home oxygen therapy    "2L suppose to be q night" (05/03/2013)   Orthostatic hypotension 07/28/2017   Peripheral neuropathy    Feet    SVD (spontaneous vaginal delivery)    x 2   Unspecified vitamin D deficiency 03/26/2011   Does not take meds    Past Surgical History:  Procedure Laterality Date   BI-VENTRICULAR IMPLANTABLE CARDIOVERTER DEFIBRILLATOR N/A 05/03/2013   Procedure: BI-VENTRICULAR IMPLANTABLE CARDIOVERTER DEFIBRILLATOR  (CRT-D);  Surgeon: Evans Lance, MD;  Location: [facility] CATH LAB;  Service: Cardiovascular;  Laterality: N/A;   BI-VENTRICULAR IMPLANTABLE CARDIOVERTER DEFIBRILLATOR  (CRT-D)  05/03/2013   MDT CRTD implanted by Dr Lovena Le for non ischemic cardiomyopathy   BIOPSY  05/31/2018   Procedure: BIOPSY;  Surgeon: Ronnette Juniper, MD;  Location: [facility] ENDOSCOPY;  Service: Gastroenterology;;   [place] N/A 04/11/2020   Procedure: [place];  Surgeon: Evans Lance, MD;  Location: [facility] LAB;  Service: Cardiovascular;  Laterality: N/A;   BREAST LUMPECTOMY Left 07/28/2010   CARDIAC CATHETERIZATION  2010   NORMAL CORONARY ARTERIES   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--11 SURGERIES     PT HAS REMOVABLE SPEECH BULB-TAKES IT OUT BEFORE HER SURGERY   COLONOSCOPY     COLONOSCOPY N/A 05/31/2018   Procedure: COLONOSCOPY;  Surgeon: Ronnette Juniper, MD;  Location: [facility]  ENDOSCOPY;  Service: Gastroenterology;  Laterality: N/A;   HYSTEROSCOPY WITH D & C  01/20/2012   Procedure: DILATATION AND CURETTAGE /HYSTEROSCOPY;  Surgeon: Margarette Asal, MD;  Location: [facility]S;  Service: Gynecology;  Laterality: N/A;  with trueclear   PORT-A-CATH REMOVAL  09/23/2011   Procedure: REMOVAL PORT-A-CATH;  Surgeon: Rolm Bookbinder, MD;  Location: [facility]S;  Service: General;  Laterality: N/A;  Port Removal   PORTACATH PLACEMENT  2012   TONSILLECTOMY  1960's   TOTAL KNEE ARTHROPLASTY  05/10/2012   Procedure: TOTAL KNEE ARTHROPLASTY;  Surgeon: Mauri Pole, MD;  Location: [facility] ORS;  Service: Orthopedics;  Laterality: Right;   WISDOM TOOTH EXTRACTION      There were no vitals filed for this visit.    Subjective Assessment - 05/14/21 1447     Subjective Patient arrives with spouse usng sbqc.  She denies any falls since her last visit.  She denies any pain except for right pointer finger where she broke her nail down into the bed of the nail and this is really painful.    Currently in Pain? No/denies                               [facility] Adult PT Treatment/Exercise - 05/14/21 0001       Transfers   Transfers Sit to Stand;Stand to Sit    Sit to Stand 6: Modified independent (Device/Increase time)      Exercises   Exercises Knee/Hip      Knee/Hip Exercises: Aerobic   Nustep Level 1 x 5 min      Knee/Hip Exercises: Seated   Long Arc Quad Strengthening;Both;2 sets;10 reps;Weights    Long Arc Quad Weight 2 lbs.    Clamshell with [employer] Green   green loop   Marching Strengthening;Both;1 set;20 reps    Sit to [employer] 1 set;10 reps;without UE support      Knee/Hip Exercises: Supine   Short Arc Quad Sets Strengthening;Both;2 sets;10 reps    Short Arc Quad Sets Limitations 2 lbs    Straight Leg Raises Strengthening;Both;2 sets;10 reps                     PT Education - 05/14/21 1512     Education Details Access Code: [email or web address]    Person(s) Educated Patient    Methods Explanation;Demonstration;Handout    Comprehension Verbalized understanding;Returned demonstration              PT Short Term Goals - 04/30/21 1541       PT SHORT TERM GOAL #1   Title Independence with initial HEP    Time 4    Period Weeks    Status New    Target Date 05/28/21      PT SHORT TERM GOAL #2   Title No falls first 4 weeks of PT    Time 4    Period Weeks    Status New    Target Date 05/28/21               PT Long Term Goals - 04/30/21 1542       PT LONG TERM GOAL #1   Title Independent with advanced HEP    Time 8    Period Weeks    Status New    Target Date 06/25/21      PT LONG TERM GOAL #2   Title TUG to be 12 sec or  under    Baseline  17.22    Time 8    Period Weeks    Status New    Target Date 06/25/21      PT LONG TERM GOAL #3   Title Patient to be able to walk on level surfaces for exercise    Time 8    Period Weeks    Status New    Target Date 06/25/21      PT LONG TERM GOAL #4   Title Patient to report low back pain at no greater than 2/10    Time 8    Period Weeks    Status New    Target Date 06/25/21                   Plan - 05/14/21 1515     Clinical Impression Statement Patient was able to complete all tasks today without difficulty.  She became slightly short of breath with sit to stand and and NuStep.  She needs verbal cues to stay focused on counting her reps.  She had no pain to speak of following session.    Personal Factors and Comorbidities Age;Comorbidity 1;Comorbidity 2;Fitness    Examination-Activity Limitations Lift;Bend;Squat;Stand    Examination-Participation Restrictions Meal Prep;Community Activity    Stability/Clinical Decision Making Evolving/Moderate complexity    Clinical Decision Making Moderate    Rehab Potential Fair    PT Frequency 1x / week    PT Duration 8 weeks    PT Treatment/Interventions ADLs/Self Care Home Management;Aquatic Therapy;Canalith Repostioning;Traction;Moist Heat;Iontophoresis 97m/ml Dexamethasone;Electrical Stimulation;Cryotherapy;Ultrasound;DME Instruction;Gait training;Therapeutic exercise;Therapeutic activities;Functional mobility training;Stair training;Balance training;Neuromuscular re-education;Patient/family education;Manual techniques;Passive range of motion;Vestibular;Vasopneumatic Device;Taping;Energy conservation;Dry needling;Joint Manipulations    PT Next Visit Plan Progress hip strengthening and begin balance training.    PT Home Exercise Plan Access Code: [email or web address] URL: [email or web address]  Date: 05/14/2021  Prepared by: JCandyce Churn   Exercises  Seated Long Arc Quad - 2 x daily - 7 x weekly - 2 sets - 10 reps  Seated  March - 2 x daily - 7 x weekly - 2 sets - 10 reps  Seated Hip Abduction with Resistance - 2 x daily - 7 x weekly - 2 sets - 10 reps  Supine Straight Leg Raises - 2 x daily - 7 x weekly - 2 sets - 10 reps  Supine Knee Extension Strengthening - 2 x daily - 7 x weekly - 2 sets - 10 reps  Supine Hip Abduction - 2 x daily - 7 x weekly - 2 sets - 10 reps    Consulted and Agree with Plan of Care Patient             Patient will benefit from skilled therapeutic intervention in order to improve the following deficits and impairments:  Abnormal gait, Decreased balance, Decreased endurance, Decreased mobility, Difficulty walking, Decreased activity tolerance, Decreased knowledge of use of DME, Decreased safety awareness, Decreased strength, Pain  Visit Diagnosis: Repeated falls  Chronic bilateral low back pain without sciatica  Muscle weakness (generalized)  Difficulty in walking, not elsewhere classified     Problem List Patient Active Problem List   Diagnosis Date Noted   Lumbago without sciatica 04/30/2021   Abnormal gait 06/11/2020   Adult failure to thrive syndrome 06/11/2020   Allergic rhinitis 06/11/2020   Anxiety 06/11/2020   Asthma 06/11/2020   Benign intracranial hypertension 06/11/2020   Body mass index (BMI) 45.0-49.9, adult ([place] 06/11/2020   Bowel incontinence 06/11/2020   Cholelithiasis 06/11/2020  Chronic sinusitis 06/11/2020   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   Daytime somnolence 06/11/2020   Diarrhea of presumed infectious origin 06/11/2020   Edema 06/11/2020   Family history of malignant neoplasm of gastrointestinal tract 06/11/2020   Gout 06/11/2020   History of fall 06/11/2020   History of infectious disease 06/11/2020   Insomnia 06/11/2020   Irregular bowel habits 06/11/2020   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   Lumbar spondylosis with myelopathy 06/11/2020   Macrocytosis 06/11/2020   Mild recurrent major depression ([place]) 06/11/2020   Personal history of colonic  polyps 06/11/2020   Personal history of malignant neoplasm of breast 06/11/2020   Repeated falls 06/11/2020   Mixed anxiety and depressive disorder 06/11/2020   Irritable bowel syndrome 01/04/2020   Spinal stenosis of lumbar region 01/03/2020   Other symptoms and signs involving the musculoskeletal system 09/11/2019   Bilateral leg weakness 08/01/2019   Leg swelling 08/01/2019   Diabetes mellitus with neuropathy ([place]) 06/15/2019   Hypokalemia 11/08/2018   CKD (chronic kidney disease) stage 3, GFR 30-59 ml/min ([facility]) 11/08/2018   Orthostatic hypotension 07/28/2017   SVD (spontaneous vaginal delivery)    Peripheral neuropathy    On home oxygen therapy    Migraines    Left bundle branch block    Hypothyroidism    Hypertension    Hyperlipidemia    Heart murmur    GERD (gastroesophageal reflux disease)    Exertional shortness of breath    Major depressive disorder    Back pain    Arthritis    Generalized anxiety disorder    Anemia    Chronic respiratory failure ([place]) 09/14/2013   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 Akari C[employer]ler, PT 12/21/223:32 PM  PHYSICAL THERAPY DISCHARGE SUMMARY  Visits from Start of Care: 2  Current functional level related to goals / functional outcomes: Pt attended 2 sessions and canceled all remaining.  POC is now expired and pt will required a new order if she is to return to PT.   Remaining deficits: See above for most current PT status.    Education / Equipment: HEP   Patient agrees to discharge. Patient goals were not met. Patient is being discharged due to not returning since the last visit.  [facility] @ [place],  [place] 07/03/21 3:44 PM  [place], [place], [address] Phone: [phone]   Fax:  [phone]  Name: Rhonda [name] MRN: [id number] Date of Birth: February 22, 1951

## 2021-05-14 NOTE — Patient Instructions (Signed)
Access Code: [email or web address] URL: [email or web address] Date: 05/14/2021 Prepared by: Candyce Churn  Exercises Seated Long Arc Quad - 2 x daily - 7 x weekly - 2 sets - 10 reps Seated March - 2 x daily - 7 x weekly - 2 sets - 10 reps Seated Hip Abduction with Resistance - 2 x daily - 7 x weekly - 2 sets - 10 reps Supine Straight Leg Raises - 2 x daily - 7 x weekly - 2 sets - 10 reps Supine Knee Extension Strengthening - 2 x daily - 7 x weekly - 2 sets - 10 reps Supine Hip Abduction - 2 x daily - 7 x weekly - 2 sets - 10 reps

## 2021-05-20 ENCOUNTER — Other Ambulatory Visit (HOSPITAL_COMMUNITY): Payer: Self-pay

## 2021-05-20 NOTE — Progress Notes (Signed)
Paramedicine Encounter    Patient ID: Rhonda [name], female    DOB: 1950-09-25, 70 y.o.   MRN: [id number]   Paramedicine medication reconciliation completed. Meds confirmed and pill box filled accordingly. I will see [name] for full paramedicine visit in one week.  NO REFILLS NEEDED    ACTION: Home visit completed

## 2021-05-21 ENCOUNTER — Ambulatory Visit: Payer: Medicare HMO

## 2021-05-21 DIAGNOSIS — M1990 Unspecified osteoarthritis, unspecified site: Secondary | ICD-10-CM | POA: Diagnosis not present

## 2021-05-21 DIAGNOSIS — E039 Hypothyroidism, unspecified: Secondary | ICD-10-CM | POA: Diagnosis not present

## 2021-05-21 DIAGNOSIS — G4700 Insomnia, unspecified: Secondary | ICD-10-CM | POA: Diagnosis not present

## 2021-05-21 DIAGNOSIS — E785 Hyperlipidemia, unspecified: Secondary | ICD-10-CM | POA: Diagnosis not present

## 2021-05-21 DIAGNOSIS — E1140 Type 2 diabetes mellitus with diabetic neuropathy, unspecified: Secondary | ICD-10-CM | POA: Diagnosis not present

## 2021-05-21 DIAGNOSIS — F330 Major depressive disorder, recurrent, mild: Secondary | ICD-10-CM | POA: Diagnosis not present

## 2021-05-21 DIAGNOSIS — K219 Gastro-esophageal reflux disease without esophagitis: Secondary | ICD-10-CM | POA: Diagnosis not present

## 2021-05-21 DIAGNOSIS — I10 Essential (primary) hypertension: Secondary | ICD-10-CM | POA: Diagnosis not present

## 2021-05-21 DIAGNOSIS — N183 Chronic kidney disease, stage 3 unspecified: Secondary | ICD-10-CM | POA: Diagnosis not present

## 2021-05-27 ENCOUNTER — Other Ambulatory Visit (HOSPITAL_COMMUNITY): Payer: Self-pay

## 2021-05-27 NOTE — Progress Notes (Signed)
Paramedicine Encounter    Patient ID: Rhonda [name], female    DOB: [birth year], 71 y.o.   MRN: [id number]   Arrived for home visit for Rhonda [name] who reports feeling okay just tired. Rhonda [name] was compliant with medications. Vitals and assessment completed and as noted. Some mild swelling noted to lower legs.  BP elevated however she reports she has not yet taken her morning medications and ate seafood last night.  We discussed heart healthy diet and ways to maintain this. She agreed and reports she wants to [facility] back at the [facility] for [facility].   We reviewed appointments and confirmed same.   Medications reviewed and confirmed. Pill box filled accordingly.   Home visit complete. I will see Rhonda [name] in one week.   Refills: Simvastatin    Patient Care Team: [name], LCSW as Social Worker (Licensed Clinical Social Worker) [name], [name], DO as Consulting Physician (Neurology)  Patient Active Problem List   Diagnosis Date Noted   Lumbago without sciatica 04/30/2021   Abnormal gait 06/11/2020   Adult failure to thrive syndrome 06/11/2020   Allergic rhinitis 06/11/2020   Anxiety 06/11/2020   Asthma 06/11/2020   Benign intracranial hypertension 06/11/2020   Body mass index (BMI) 45.0-49.9, adult ([place]) 06/11/2020   Bowel incontinence 06/11/2020   Cholelithiasis 06/11/2020   Chronic sinusitis 06/11/2020   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   Daytime somnolence 06/11/2020   Diarrhea of presumed infectious origin 06/11/2020   Edema 06/11/2020   Family history of malignant neoplasm of gastrointestinal tract 06/11/2020   Gout 06/11/2020   History of fall 06/11/2020   History of infectious disease 06/11/2020   Insomnia 06/11/2020   Irregular bowel habits 06/11/2020   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   Lumbar spondylosis with myelopathy 06/11/2020   Macrocytosis 06/11/2020   Mild recurrent major depression ([place]) 06/11/2020   Personal history of colonic polyps 06/11/2020   Personal history of  malignant neoplasm of breast 06/11/2020   Repeated falls 06/11/2020   Mixed anxiety and depressive disorder 06/11/2020   Irritable bowel syndrome 01/04/2020   Spinal stenosis of lumbar region 01/03/2020   Other symptoms and signs involving the musculoskeletal system 09/11/2019   Bilateral leg weakness 08/01/2019   Leg swelling 08/01/2019   Diabetes mellitus with neuropathy ([place]) 06/15/2019   Hypokalemia 11/08/2018   CKD (chronic kidney disease) stage 3, GFR 30-59 ml/min ([facility]) 11/08/2018   Orthostatic hypotension 07/28/2017   SVD (spontaneous vaginal delivery)    Peripheral neuropathy    On home oxygen therapy    Migraines    Left bundle branch block    Hypothyroidism    Hypertension    Hyperlipidemia    Heart murmur    GERD (gastroesophageal reflux disease)    Exertional shortness of breath    Major depressive disorder    Back pain    Arthritis    Generalized anxiety disorder    Anemia    Chronic respiratory failure ([place]) 09/14/2013   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   Morbid obesity ([name]) 30/01/2329   Chronic systolic heart failure ([place]) 10/27/2012   Endometrial polyp 01/20/2012   Malignant tumor of breast ([place]) 03/26/2011   Unspecified vitamin D deficiency 03/26/2011    Current Outpatient Medications:    acetaZOLAMIDE (DIAMOX) 125 MG tablet, Take 1 tablet (125 mg total) by mouth daily., Disp: 90 tablet, Rfl: 3   carvedilol (COREG) 3.125 MG tablet, TAKE 1 TABLET (3.125 MG) BY MOUTH TWICE DAILY WITH MEALS, Disp: 180 tablet, Rfl: 3  citalopram (CELEXA) 40 MG tablet, Take 40 mg by mouth daily., Disp: , Rfl:    clonazePAM (KLONOPIN) 0.5 MG tablet, , Disp: , Rfl:    dicyclomine (BENTYL) 20 MG tablet, Take 20 mg by mouth 3 (three) times daily before meals., Disp: , Rfl:    DULoxetine (CYMBALTA) 60 MG capsule, Take 60 mg by mouth daily., Disp: , Rfl:    fexofenadine (ALLEGRA) 180 MG tablet, Take 180 mg by mouth 2 (two) times daily.  (Patient not taking: Reported on 04/29/2021), Disp: , Rfl:    fludrocortisone (FLORINEF) 0.1 MG tablet, Take 1 tablet (100 mcg total) by mouth 2 (two) times daily., Disp: 180 tablet, Rfl: 3   fluticasone (FLONASE) 50 MCG/ACT nasal spray, , Disp: , Rfl:    gabapentin (NEURONTIN) 300 MG capsule, Take 1 capsule by mouth 3 (three) times daily., Disp: , Rfl:    gabapentin (NEURONTIN) 800 MG tablet, Take 800 mg by mouth at bedtime., Disp: , Rfl:    gabapentin (NEURONTIN) 800 MG tablet, TAKE 1 TABLET AT BEDTIME AS NEEDED FOR NEUROPATHY, Disp: 30 tablet, Rfl: 1   Ginkgo Biloba 120 MG CAPS, See admin instructions. (Patient not taking: Reported on 04/29/2021), Disp: , Rfl:    ipratropium (ATROVENT) 0.03 % nasal spray, Place 2 sprays into both nostrils 2 (two) times daily., Disp: , Rfl:    levothyroxine (SYNTHROID) 88 MCG tablet, TAKE 1 TABLET(88 MCG) BY MOUTH DAILY BEFORE BREAKFAST, Disp: 90 tablet, Rfl: 3   metolazone (ZAROXOLYN) 2.5 MG tablet, TAKE 1 TABLET BY MOUTH EVERY TUESDAY, Disp: 12 tablet, Rfl: 3   midodrine (PROAMATINE) 10 MG tablet, TAKE 1 TABLET (10 MG TOTAL) BY MOUTH 3 (THREE) TIMES DAILY WITH MEALS., Disp: 270 tablet, Rfl: 3   mirtazapine (REMERON) 30 MG tablet, 1 tablet at bedtime., Disp: , Rfl:    montelukast (SINGULAIR) 10 MG tablet, Take 10 mg by mouth at bedtime. (Patient not taking: Reported on 05/13/2021), Disp: , Rfl: 1   Multiple Vitamin (MULTIVITAMIN) tablet, Take 1 tablet by mouth daily., Disp: , Rfl:    omeprazole (PRILOSEC) 40 MG capsule, Take 40 mg by mouth daily., Disp: , Rfl:    potassium chloride SA (KLOR-CON) 20 MEQ tablet, TAKE 2 TABLETS THREE TIMES DAILY. TAKE AN EXTRA 2 TABLETS ON DAYS YOU TAKE METOLAZONE, Disp: 572 tablet, Rfl: 0   simvastatin (ZOCOR) 10 MG tablet, Take 10 mg by mouth every evening., Disp: , Rfl:    spironolactone (ALDACTONE) 25 MG tablet, Take 1 tablet (25 mg total) by mouth every evening. Needs appt for further refills, Disp: 90 tablet, Rfl: 0   torsemide  (DEMADEX) 20 MG tablet, Take 4 tablets (80 mg total) by mouth every morning AND 3 tablets (60 mg total) every evening., Disp: 630 tablet, Rfl: 3 Allergies  Allergen Reactions   Ceftin Anaphylaxis    Face and throat swell    Geodon [Ziprasidone Hcl] Hives   Lisinopril Other (See Comments)    angioedema   Shellfish Allergy Other (See Comments)    Gout exacerbation   Shellfish-Derived Products     Other reaction(s): Other   Ultram [Tramadol] Itching   Allopurinol Nausea Only and Other (See Comments)    weakness   Ativan [Lorazepam] Itching   Sulfa Antibiotics Itching   Ultram [Tramadol Hcl] Itching   Valium [Diazepam] Other (See Comments)    Patient states that diazepam doesn't relax, it has the opposite effect.     Social History   Socioeconomic History   Marital status: Married  Spouse name: Not on file   Number of children: 2   Years of education: 44   Highest education level: Master's degree (e.g., MA, MS, MEng, MEd, MSW, MBA)  Occupational History   Occupation: retired  Tobacco Use   Smoking status: Former    Packs/day: 0.10    Years: 26.00    Pack years: 2.60    Types: Cigarettes    Quit date: 03/23/2021    Years since quitting: 0.1   Smokeless tobacco: Never  Vaping Use   Vaping Use: Never used  Substance and Sexual Activity   Alcohol use: No   Drug use: No   Sexual activity: Yes  Other Topics Concern   Not on file  Social History Narrative   Tobacco Use Cigarettes: Former Smoker, Quit in 2008   No Alcohol   No recreational drug use   Diet: Regular/Low Carb   Exercise: None   Occupation: disabled   Education: Research officer, political party, masters   Children: 2   Firearms: No   Therapist, art Use: 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  Constitutional:      Appearance: Normal appearance. She is normal weight.  HENT:     Head: Normocephalic.     Nose: Nose normal.     Mouth/Throat:     Mouth: Mucous membranes are moist.     Pharynx: Oropharynx is clear.  Eyes:     Conjunctiva/sclera: Conjunctivae normal.     Pupils: Pupils are equal, round, and reactive to light.  Cardiovascular:     Rate and Rhythm: Normal rate and regular rhythm.     Pulses: Normal pulses.     Heart sounds: Normal heart sounds.  Pulmonary:     Effort: Pulmonary effort is normal.     Breath sounds: Normal breath sounds.  Abdominal:     General: Abdomen is flat.     Palpations: Abdomen is soft.  Musculoskeletal:        General: Swelling present. Normal range of motion.     Cervical back: Normal range of motion.     Right lower leg: No edema.     Left lower leg: No edema.  Skin:    General: Skin is warm and dry.     Capillary Refill: Capillary refill takes less than 2 seconds.  Neurological:     General: No focal deficit present.     Mental Status: She is alert. Mental status is at baseline.  Psychiatric:        Mood and Affect: Mood normal.        Future Appointments  Date Time Provider [place]  05/28/2021  2:45 PM Danie Binder, PT [facility] None  06/04/2021  2:45 PM Candyce Churn B, PT [facility] None  06/11/2021  2:45 PM Candyce Churn B, PT [facility] None  06/18/2021  2:45 PM Candyce Churn B, PT [facility] None  06/19/2021  3:00 PM [facility] ECHO OP 1 [facility]  06/25/2021  1:40 PM Bensimhon, Shaune Pascal, MD [facility] None  07/10/2021  7:40 AM [facility] DEVICE REMOTES [facility]  07/31/2021  1:00 PM  Hazle Coca, PhD [facility] None  07/31/2021  2:00 PM LBN- NEUROPSYCH TECH [facility] None  08/07/2021 10:00 AM Hazle Coca, PhD [facility] None  08/11/2021  7:40 AM [facility] DEVICE REMOTES [facility]   09/10/2021  7:40 AM [facility] DEVICE REMOTES [facility]  03/04/2022  8:50 AM Patel, Arvin Collard K, DO [facility] None     ACTION: Home visit completed

## 2021-06-03 ENCOUNTER — Other Ambulatory Visit (HOSPITAL_COMMUNITY): Payer: Self-pay

## 2021-06-03 ENCOUNTER — Ambulatory Visit
Admission: EM | Admit: 2021-06-03 | Discharge: 2021-06-03 | Disposition: A | Discharge status: Home / Self Care | Payer: Medicare HMO | Attending: Internal Medicine | Admitting: Internal Medicine

## 2021-06-03 ENCOUNTER — Ambulatory Visit (INDEPENDENT_AMBULATORY_CARE_PROVIDER_SITE_OTHER): Payer: Medicare HMO

## 2021-06-03 ENCOUNTER — Encounter: Payer: Self-pay | Admitting: Emergency Medicine

## 2021-06-03 ENCOUNTER — Other Ambulatory Visit: Payer: Self-pay

## 2021-06-03 DIAGNOSIS — R051 Acute cough: Secondary | ICD-10-CM

## 2021-06-03 DIAGNOSIS — R059 Cough, unspecified: Secondary | ICD-10-CM

## 2021-06-03 DIAGNOSIS — J069 Acute upper respiratory infection, unspecified: Secondary | ICD-10-CM | POA: Diagnosis not present

## 2021-06-03 MED ORDER — PREDNISONE 20 MG PO TABS: 40.0000 mg | ORAL_TABLET | Freq: Every day | ORAL | 0 refills | Status: AC

## 2021-06-03 NOTE — Progress Notes (Signed)
Duplicate entry

## 2021-06-03 NOTE — ED Triage Notes (Signed)
Pt here for cough, nasal congestion and diarrhea x 3 days

## 2021-06-03 NOTE — ED Provider Notes (Signed)
[facility]    CSN: [id number] Arrival date & time: 06/03/21  1310      History   Chief Complaint Chief Complaint  Patient presents with   Cough   Diarrhea    HPI Rhonda [name] is a 71 y.o. female.   Patient presents with 1 week history of cough, nasal congestion, sore throat, diarrhea.  Cough is productive with yellow sputum per patient.  Denies any shortness of breath but does report some chest tightness and chest pain that only occurs with coughing.  Denies any known fevers or sick contacts.  Denies pain, nausea, vomiting, abdominal pain.  Patient has taken Mucinex for it with minimal improvement in symptoms.   Cough Diarrhea  Past Medical History:  Diagnosis Date   Anemia    Arthritis    Right knee   Asthma    Back pain    Disk problem   Breast cancer, stage 1 () 03/26/2011   Left; completed chemotherapy and radiation treatments   Cardiomyopathy    Chronic respiratory failure ([place]) 6/94/8546   Chronic systolic heart failure (HCC)    a) NICM b) ECHO (03/2013) EF 20-25% c) ECHO (09/2013) EF 45-50%, grade I DD   CKD (chronic kidney disease) stage 3, GFR 30-59 ml/min (HCC) 2/70/3500   Complication of anesthesia    History of low blood pressure after surgery; attributed to lying flat   Diabetes mellitus    "diet controlled" (05/03/2013)   Exertional shortness of breath    Generalized anxiety disorder    GERD (gastroesophageal reflux disease)    Gout    Heart murmur    Hyperlipidemia    Hypertension    Hypokalemia 11/08/2018   Hypothyroidism    Left bundle branch block    s/p CRT-D (04/2013)   Major depressive disorder    Migraines    On home oxygen therapy    "2L suppose to be q night" (05/03/2013)   Orthostatic hypotension 07/28/2017   Peripheral neuropathy    Feet   SVD (spontaneous vaginal delivery)    x 2   Unspecified vitamin D deficiency 03/26/2011   Does not take meds    Patient Active Problem List   Diagnosis Date Noted    Lumbago without sciatica 04/30/2021   Abnormal gait 06/11/2020   Adult failure to thrive syndrome 06/11/2020   Allergic rhinitis 06/11/2020   Anxiety 06/11/2020   Asthma 06/11/2020   Benign intracranial hypertension 06/11/2020   Body mass index (BMI) 45.0-49.9, adult ([place]) 06/11/2020   Bowel incontinence 06/11/2020   Cholelithiasis 06/11/2020   Chronic sinusitis 06/11/2020   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   Daytime somnolence 06/11/2020   Diarrhea of presumed infectious origin 06/11/2020   Edema 06/11/2020   Family history of malignant neoplasm of gastrointestinal tract 06/11/2020   Gout 06/11/2020   History of fall 06/11/2020   History of infectious disease 06/11/2020   Insomnia 06/11/2020   Irregular bowel habits 06/11/2020   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   Lumbar spondylosis with myelopathy 06/11/2020   Macrocytosis 06/11/2020   Mild recurrent major depression ([place]) 06/11/2020   Personal history of colonic polyps 06/11/2020   Personal history of malignant neoplasm of breast 06/11/2020   Repeated falls 06/11/2020   Mixed anxiety and depressive disorder 06/11/2020   Irritable bowel syndrome 01/04/2020   Spinal stenosis of lumbar region 01/03/2020   Other symptoms and signs involving the musculoskeletal system 09/11/2019   Bilateral leg weakness 08/01/2019   Leg swelling 08/01/2019   Diabetes mellitus  with neuropathy ([place]) 06/15/2019   Hypokalemia 11/08/2018   CKD (chronic kidney disease) stage 3, GFR 30-59 ml/min (HCC) 11/08/2018   Orthostatic hypotension 07/28/2017   SVD (spontaneous vaginal delivery)    Peripheral neuropathy    On home oxygen therapy    Migraines    Left bundle branch block    Hypothyroidism    Hypertension    Hyperlipidemia    Heart murmur    GERD (gastroesophageal reflux disease)    Exertional shortness of breath    Major depressive disorder    Back pain    Arthritis    Generalized anxiety disorder    Anemia    Chronic respiratory failure ([place])  09/14/2013   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   Morbid obesity ([place]) 93/81/8299   Chronic systolic heart failure ([place]) 10/27/2012   Endometrial polyp 01/20/2012   Malignant tumor of breast ([place]) 03/26/2011   Unspecified vitamin D deficiency 03/26/2011    Past Surgical History:  Procedure Laterality Date   BI-VENTRICULAR IMPLANTABLE CARDIOVERTER DEFIBRILLATOR N/A 05/03/2013   Procedure: BI-VENTRICULAR IMPLANTABLE CARDIOVERTER DEFIBRILLATOR  (CRT-D);  Surgeon: Evans Lance, MD;  Location: [facility];  Service: Cardiovascular;  Laterality: N/A;   BI-VENTRICULAR IMPLANTABLE CARDIOVERTER DEFIBRILLATOR  (CRT-D)  05/03/2013   MDT CRTD implanted by Dr Lovena Le for non ischemic cardiomyopathy   BIOPSY  05/31/2018   Procedure: BIOPSY;  Surgeon: Ronnette Juniper, MD;  Location: [facility];  Service: Gastroenterology;;   [place] N/A 04/11/2020   Procedure: [place];  Surgeon: Evans Lance, MD;  Location: [facility];  Service: Cardiovascular;  Laterality: N/A;   BREAST LUMPECTOMY Left 07/28/2010   CARDIAC CATHETERIZATION  2010   NORMAL CORONARY ARTERIES   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--11 SURGERIES     PT HAS REMOVABLE SPEECH BULB-TAKES IT OUT BEFORE HER SURGERY   COLONOSCOPY     COLONOSCOPY N/A 05/31/2018   Procedure: COLONOSCOPY;  Surgeon: Ronnette Juniper, MD;  Location: [facility];  Service: Gastroenterology;  Laterality: N/A;   HYSTEROSCOPY WITH D & C  01/20/2012   Procedure: DILATATION AND CURETTAGE /HYSTEROSCOPY;  Surgeon: Margarette Asal, MD;  Location: [facility];  Service: Gynecology;  Laterality: N/A;  with trueclear   PORT-A-CATH REMOVAL  09/23/2011   Procedure: REMOVAL PORT-A-CATH;  Surgeon: Rolm Bookbinder, MD;  Location: [facility];  Service: General;  Laterality: N/A;  Port Removal   PORTACATH PLACEMENT  2012   TONSILLECTOMY  1960's   TOTAL KNEE ARTHROPLASTY  05/10/2012   Procedure: TOTAL KNEE  ARTHROPLASTY;  Surgeon: Mauri Pole, MD;  Location: [facility];  Service: Orthopedics;  Laterality: Right;   WISDOM TOOTH EXTRACTION      OB History   No obstetric history on file.      Home Medications    Prior to Admission medications   Medication Sig Start Date End Date Taking? Authorizing Provider  predniSONE (DELTASONE) 20 MG tablet Take 2 tablets (40 mg total) by mouth daily for 5 days. 06/03/21 06/08/21 Yes , Michele Rockers, FNP  acetaZOLAMIDE (DIAMOX) 125 MG tablet Take 1 tablet (125 mg total) by mouth daily. 02/27/21   Patel, Donika K, DO  carvedilol (COREG) 3.125 MG tablet TAKE 1 TABLET (3.125 MG) BY MOUTH TWICE DAILY WITH MEALS 07/08/20   Bensimhon, Shaune Pascal, MD  citalopram (CELEXA) 40 MG tablet Take 40 mg by mouth daily.    [provider]  clonazePAM (KLONOPIN) 0.5 MG tablet  07/23/20  [provider]  dicyclomine (BENTYL) 20 MG tablet Take 20 mg by mouth 3 (three) times daily before meals.    [provider]  DULoxetine (CYMBALTA) 60 MG capsule Take 60 mg by mouth daily.    [provider]  fexofenadine (ALLEGRA) 180 MG tablet Take 180 mg by mouth 2 (two) times daily. Patient not taking: Reported on 04/29/2021    [provider]  fludrocortisone (FLORINEF) 0.1 MG tablet Take 1 tablet (100 mcg total) by mouth 2 (two) times daily. 02/27/21   Narda Amber K, DO  fluticasone Asencion Islam) 50 MCG/ACT nasal spray  08/17/20   [provider]  gabapentin (NEURONTIN) 300 MG capsule Take 1 capsule by mouth 3 (three) times daily.    [provider]  gabapentin (NEURONTIN) 800 MG tablet Take 800 mg by mouth at bedtime.    [provider]  gabapentin (NEURONTIN) 800 MG tablet TAKE 1 TABLET AT BEDTIME AS NEEDED FOR NEUROPATHY 04/30/21   Patel, Donika K, DO  Ginkgo Biloba 120 MG CAPS See admin instructions. Patient not taking: Reported on 04/29/2021    [provider]  ipratropium (ATROVENT) 0.03 % nasal spray Place 2  sprays into both nostrils 2 (two) times daily.    [provider]  levothyroxine (SYNTHROID) 88 MCG tablet TAKE 1 TABLET(88 MCG) BY MOUTH DAILY BEFORE BREAKFAST 07/08/20   Philemon Kingdom, MD  metolazone (ZAROXOLYN) 2.5 MG tablet TAKE 1 TABLET BY MOUTH EVERY TUESDAY 09/10/20   Bensimhon, Shaune Pascal, MD  midodrine (PROAMATINE) 10 MG tablet TAKE 1 TABLET (10 MG TOTAL) BY MOUTH 3 (THREE) TIMES DAILY WITH MEALS. 07/01/20   Bensimhon, Shaune Pascal, MD  mirtazapine (REMERON) 30 MG tablet 1 tablet at bedtime.    [provider]  montelukast (SINGULAIR) 10 MG tablet Take 10 mg by mouth at bedtime. Patient not taking: Reported on 05/13/2021 09/28/16   [provider]  Multiple Vitamin (MULTIVITAMIN) tablet Take 1 tablet by mouth daily.    [provider]  omeprazole (PRILOSEC) 40 MG capsule Take 40 mg by mouth daily.    [provider]  potassium chloride SA (KLOR-CON) 20 MEQ tablet TAKE 2 TABLETS THREE TIMES DAILY. TAKE AN EXTRA 2 TABLETS ON DAYS YOU TAKE METOLAZONE 03/24/21   Bensimhon, Shaune Pascal, MD  simvastatin (ZOCOR) 10 MG tablet Take 10 mg by mouth every evening.    [provider]  spironolactone (ALDACTONE) 25 MG tablet Take 1 tablet (25 mg total) by mouth every evening. Needs appt for further refills 12/02/20   Clegg, Amy D, NP  torsemide (DEMADEX) 20 MG tablet Take 4 tablets (80 mg total) by mouth every morning AND 3 tablets (60 mg total) every evening. 03/19/21   [place], Maricela Bo, FNP    Family History Family History  Problem Relation Age of Onset   Alzheimer's disease Mother    CVA Mother    Hypertension Mother    Cancer - Colon Father        late 4   Birth defects Paternal Uncle     Social History Social History   Tobacco Use   Smoking status: Former    Packs/day: 0.10    Years: 26.00    Pack years: 2.60    Types: Cigarettes    Quit date: 03/23/2021    Years since quitting: 0.1   Smokeless tobacco: Never  Vaping Use   Vaping  Use: Never used  Substance Use Topics   Alcohol use: No   Drug use: No  Allergies   Ceftin, Geodon [ziprasidone hcl], Lisinopril, Shellfish allergy, Shellfish-derived products, Ultram [tramadol], Allopurinol, Ativan [lorazepam], Sulfa antibiotics, Ultram [tramadol hcl], and Valium [diazepam]   Review of Systems Review of Systems Per HPI  Physical Exam Triage Vital Signs ED Triage Vitals  Enc Vitals Group     BP 06/03/21 1416 (!) 147/74     Pulse Rate 06/03/21 1416 91     Resp 06/03/21 1416 18     Temp 06/03/21 1416 (!) 97.4 F (36.3 C)     Temp Source 06/03/21 1416 Oral     SpO2 06/03/21 1416 96 %     Weight --      Height --      Head Circumference --      Peak Flow --      Pain Score 06/03/21 1417 5     Pain Loc --      Pain Edu? --      Excl. in [place]? --    No data found.  Updated Vital Signs BP (!) 147/74 (BP Location: Left Arm)    Pulse 91    Temp (!) 97.4 F (36.3 C) (Oral)    Resp 18    SpO2 96%   Visual Acuity Right Eye Distance:   Left Eye Distance:   Bilateral Distance:    Right Eye Near:   Left Eye Near:    Bilateral Near:     Physical Exam Constitutional:      General: She is not in acute distress.    Appearance: Normal appearance. She is not toxic-appearing or diaphoretic.  HENT:     Head: Normocephalic and atraumatic.     Right Ear: Tympanic membrane and ear canal normal.     Left Ear: Tympanic membrane and ear canal normal.     Nose: Congestion present.     Mouth/Throat:     Mouth: Mucous membranes are moist.     Pharynx: No posterior oropharyngeal erythema.     Comments: Cleft palate present. Eyes:     Extraocular Movements: Extraocular movements intact.     Conjunctiva/sclera: Conjunctivae normal.     Pupils: Pupils are equal, round, and reactive to light.  Cardiovascular:     Rate and Rhythm: Normal rate and regular rhythm.     Pulses: Normal pulses.     Heart sounds: Normal heart sounds.  Pulmonary:     Effort: Pulmonary  effort is normal. No respiratory distress.     Breath sounds: No stridor. Wheezing present. No rhonchi or rales.  Abdominal:     General: Abdomen is flat. Bowel sounds are normal.     Palpations: Abdomen is soft.  Musculoskeletal:        General: Normal range of motion.     Cervical back: Normal range of motion.  Skin:    General: Skin is warm and dry.  Neurological:     General: No focal deficit present.     Mental Status: She is alert and oriented to person, place, and time. Mental status is at baseline.  Psychiatric:        Mood and Affect: Mood normal.        Behavior: Behavior normal.     UC Treatments / Results  Labs (all labs ordered are listed, but only abnormal results are displayed) Labs Reviewed  COVID-19, FLU A+B NAA    EKG   Radiology DG Chest 2 View  Result Date: 06/03/2021 CLINICAL DATA:  Cough. EXAM: CHEST - 2 VIEW COMPARISON:  November 08, 2018. FINDINGS: The heart size and mediastinal contours are within normal limits. Both lungs are clear. Left-sided defibrillator is unchanged in position. The visualized skeletal structures are unremarkable. IMPRESSION: No active cardiopulmonary disease. Electronically Signed   By: Marijo Conception M.D.   On: 06/03/2021 14:57    Procedures Procedures (including critical care time)  Medications Ordered in UC Medications - No data to display  Initial Impression / Assessment and Plan / UC Course  I have reviewed the triage vital signs and the nursing notes.  Pertinent labs & imaging results that were available during my care of the patient were reviewed by me and considered in my medical decision making (see chart for details).     Patient presents with symptoms likely from a viral upper respiratory infection. Differential includes bacterial pneumonia, sinusitis, allergic rhinitis, COVID-19, flu. Do not suspect underlying cardiopulmonary process. Symptoms seem unlikely related to ACS, CHF or COPD exacerbations, pneumonia,  pneumothorax. Patient is nontoxic appearing and not in need of emergent medical intervention.  COVID-19 and flu test pending.  Do not think that strep test is necessary given appearance of posterior pharynx on exam.  Recommended symptom control with over the counter medications.  Patient does have wheezing on exam but chest x-ray was negative for any acute cardiopulmonary process.  Do think that patient would benefit from low-dose and short course of prednisone but it is risky given patient's heart failure.  Last EF was 55 to 60% so do think that it would be safe to do low-dose and short course of prednisone and patient reports that she has taken this and tolerated well previously.  Unable to do albuterol inhaler as patient takes carvedilol.  Patient advised to limit sodium intake while on prednisone.  Do not think that wheezing is related to carvedilol as she has taken this for several years and wheezing is in the setting of viral upper respiratory infection.  No signs of respiratory compromise on exam and oxygen is normal.  Discussed strict return precautions.  Return if symptoms fail to improve in 1-2 weeks.  Patient states understanding and is agreeable.  Discharged with PCP followup.  Final Clinical Impressions(s) / UC Diagnoses   Final diagnoses:  Acute cough  Viral upper respiratory infection     Discharge Instructions      It appears that you have a viral illness that should resolve in the next few days.  You have been prescribed prednisone to help alleviate wheezing and your symptoms.  Please follow-up if symptoms persist or worsen.     ED Prescriptions     Medication Sig Dispense Auth. Provider   predniSONE (DELTASONE) 20 MG tablet Take 2 tablets (40 mg total) by mouth daily for 5 days. 10 tablet Teodora Medici, [place]      PDMP not reviewed this encounter.   Teodora Medici, [place] 06/03/21 [phone]

## 2021-06-03 NOTE — Discharge Instructions (Addendum)
It appears that you have a viral illness that should resolve in the next few days.  You have been prescribed prednisone to help alleviate wheezing and your symptoms.  Please follow-up if symptoms persist or worsen.

## 2021-06-03 NOTE — Progress Notes (Signed)
Paramedicine Encounter    Patient ID: [name], female    DOB: 1950-08-23, 71 y.o.   MRN: [id number]  Arrived for home visit today where Mrs. Ritzel reports she is sick, she states she has had productive cough not improving after the use of OTC medications. She also complains of diarrhea for 4 days with lack of appetite. I suggested a visit to [facility] to seek further medical attention. She denied fevers, but says she does get short of breath when walking and doing activities. She did not want me to come in her room today so we talked at a safe distance. I reviewed her medications. She was compliant with all meds over the last week. I filled pill box for one week. Refills noted. I will see Delaney in one week. She knows if she needs anything further to call me.   Refills: Gabapentin 800mg     ACTION: Home visit completed

## 2021-06-04 LAB — COVID-19, FLU A+B NAA
Influenza A, NAA: NOT DETECTED
Influenza B, NAA: NOT DETECTED
SARS-CoV-2, NAA: NOT DETECTED

## 2021-06-09 ENCOUNTER — Ambulatory Visit (INDEPENDENT_AMBULATORY_CARE_PROVIDER_SITE_OTHER): Payer: Medicare HMO

## 2021-06-09 DIAGNOSIS — I5022 Chronic systolic (congestive) heart failure: Secondary | ICD-10-CM | POA: Diagnosis not present

## 2021-06-09 DIAGNOSIS — Z95810 Presence of automatic (implantable) cardiac defibrillator: Secondary | ICD-10-CM | POA: Diagnosis not present

## 2021-06-10 ENCOUNTER — Other Ambulatory Visit: Payer: Self-pay | Admitting: Internal Medicine

## 2021-06-10 ENCOUNTER — Other Ambulatory Visit (HOSPITAL_COMMUNITY): Payer: Self-pay

## 2021-06-10 NOTE — Progress Notes (Signed)
Paramedicine Encounter    Patient ID: Rhonda [name], female    DOB: 1950-07-05, 71 y.o.   MRN: [id number]  Arrived for home visit for Horace who is reporting to be feeling good this week. Last week she was suffering from cough and cold symptoms. She has been taking Prednisone and Mucinex for the last week with improved symptoms.   Vitals and assessment as noted.   Lungs noted to have some wheezing. Some swelling noted to lower legs.   Weight 226lbs  Medications reviewed and confirmed. Pill box filled accordingly.   Appointments reviewed and confirmed.   Refills: Omeprazole ([facility]) Simvastatin ([facility]) Gabapentin ([facility])     Patient Care Team: Ardeen Garland, PA-C as PCP - General (Family Medicine) [name], LCSW as Social Worker (Licensed Clinical Social Worker) Alda Berthold, DO as Consulting Physician (Neurology)  Patient Active Problem List   Diagnosis Date Noted   Lumbago without sciatica 04/30/2021   Abnormal gait 06/11/2020   Adult failure to thrive syndrome 06/11/2020   Allergic rhinitis 06/11/2020   Anxiety 06/11/2020   Asthma 06/11/2020   Benign intracranial hypertension 06/11/2020   Body mass index (BMI) 45.0-49.9, adult ([place]) 06/11/2020   Bowel incontinence 06/11/2020   Cholelithiasis 06/11/2020   Chronic sinusitis 06/11/2020   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   Daytime somnolence 06/11/2020   Diarrhea of presumed infectious origin 06/11/2020   Edema 06/11/2020   Family history of malignant neoplasm of gastrointestinal tract 06/11/2020   Gout 06/11/2020   History of fall 06/11/2020   History of infectious disease 06/11/2020   Insomnia 06/11/2020   Irregular bowel habits 06/11/2020   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   Lumbar spondylosis with myelopathy 06/11/2020   Macrocytosis 06/11/2020   Mild recurrent major depression ([place]) 06/11/2020   Personal history of colonic polyps 06/11/2020   Personal history of malignant neoplasm of breast  06/11/2020   Repeated falls 06/11/2020   Mixed anxiety and depressive disorder 06/11/2020   Irritable bowel syndrome 01/04/2020   Spinal stenosis of lumbar region 01/03/2020   Other symptoms and signs involving the musculoskeletal system 09/11/2019   Bilateral leg weakness 08/01/2019   Leg swelling 08/01/2019   Diabetes mellitus with neuropathy ([place]) 06/15/2019   Hypokalemia 11/08/2018   CKD (chronic kidney disease) stage 3, GFR 30-59 ml/min (HCC) 11/08/2018   Orthostatic hypotension 07/28/2017   SVD (spontaneous vaginal delivery)    Peripheral neuropathy    On home oxygen therapy    Migraines    Left bundle branch block    Hypothyroidism    Hypertension    Hyperlipidemia    Heart murmur    GERD (gastroesophageal reflux disease)    Exertional shortness of breath    Major depressive disorder    Back pain    Arthritis    Generalized anxiety disorder    Anemia    Chronic respiratory failure ([place]) 09/14/2013   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   Morbid obesity ([place]) 40/98/1191   Chronic systolic heart failure ([name]) 10/27/2012   Endometrial polyp 01/20/2012   Malignant tumor of breast ([place]) 03/26/2011   Unspecified vitamin D deficiency 03/26/2011    Current Outpatient Medications:    acetaZOLAMIDE (DIAMOX) 125 MG tablet, Take 1 tablet (125 mg total) by mouth daily., Disp: 90 tablet, Rfl: 3   carvedilol (COREG) 3.125 MG tablet, TAKE 1 TABLET (3.125 MG) BY MOUTH TWICE DAILY WITH MEALS, Disp: 180 tablet, Rfl: 3   citalopram (CELEXA) 40 MG tablet, Take 40 mg by mouth  daily., Disp: , Rfl:    clonazePAM (KLONOPIN) 0.5 MG tablet, , Disp: , Rfl:    dicyclomine (BENTYL) 20 MG tablet, Take 20 mg by mouth 3 (three) times daily before meals., Disp: , Rfl:    DULoxetine (CYMBALTA) 60 MG capsule, Take 60 mg by mouth daily., Disp: , Rfl:    fexofenadine (ALLEGRA) 180 MG tablet, Take 180 mg by mouth 2 (two) times daily. (Patient not taking: Reported  on 04/29/2021), Disp: , Rfl:    fludrocortisone (FLORINEF) 0.1 MG tablet, Take 1 tablet (100 mcg total) by mouth 2 (two) times daily., Disp: 180 tablet, Rfl: 3   fluticasone (FLONASE) 50 MCG/ACT nasal spray, , Disp: , Rfl:    gabapentin (NEURONTIN) 300 MG capsule, Take 1 capsule by mouth 3 (three) times daily., Disp: , Rfl:    gabapentin (NEURONTIN) 800 MG tablet, Take 800 mg by mouth at bedtime., Disp: , Rfl:    gabapentin (NEURONTIN) 800 MG tablet, TAKE 1 TABLET AT BEDTIME AS NEEDED FOR NEUROPATHY, Disp: 30 tablet, Rfl: 1   Ginkgo Biloba 120 MG CAPS, See admin instructions. (Patient not taking: Reported on 04/29/2021), Disp: , Rfl:    ipratropium (ATROVENT) 0.03 % nasal spray, Place 2 sprays into both nostrils 2 (two) times daily., Disp: , Rfl:    levothyroxine (SYNTHROID) 88 MCG tablet, TAKE 1 TABLET(88 MCG) BY MOUTH DAILY BEFORE BREAKFAST, Disp: 90 tablet, Rfl: 3   metolazone (ZAROXOLYN) 2.5 MG tablet, TAKE 1 TABLET BY MOUTH EVERY TUESDAY, Disp: 12 tablet, Rfl: 3   midodrine (PROAMATINE) 10 MG tablet, TAKE 1 TABLET (10 MG TOTAL) BY MOUTH 3 (THREE) TIMES DAILY WITH MEALS., Disp: 270 tablet, Rfl: 3   mirtazapine (REMERON) 30 MG tablet, 1 tablet at bedtime., Disp: , Rfl:    montelukast (SINGULAIR) 10 MG tablet, Take 10 mg by mouth at bedtime. (Patient not taking: Reported on 05/13/2021), Disp: , Rfl: 1   Multiple Vitamin (MULTIVITAMIN) tablet, Take 1 tablet by mouth daily., Disp: , Rfl:    omeprazole (PRILOSEC) 40 MG capsule, Take 40 mg by mouth daily., Disp: , Rfl:    potassium chloride SA (KLOR-CON) 20 MEQ tablet, TAKE 2 TABLETS THREE TIMES DAILY. TAKE AN EXTRA 2 TABLETS ON DAYS YOU TAKE METOLAZONE, Disp: 572 tablet, Rfl: 0   simvastatin (ZOCOR) 10 MG tablet, Take 10 mg by mouth every evening., Disp: , Rfl:    spironolactone (ALDACTONE) 25 MG tablet, Take 1 tablet (25 mg total) by mouth every evening. Needs appt for further refills, Disp: 90 tablet, Rfl: 0   torsemide (DEMADEX) 20 MG tablet, Take  4 tablets (80 mg total) by mouth every morning AND 3 tablets (60 mg total) every evening., Disp: 630 tablet, Rfl: 3 Allergies  Allergen Reactions   Ceftin Anaphylaxis    Face and throat swell    Geodon [Ziprasidone Hcl] Hives   Lisinopril Other (See Comments)    angioedema   Shellfish Allergy Other (See Comments)    Gout exacerbation   Shellfish-Derived Products     Other reaction(s): Other   Ultram [Tramadol] Itching   Allopurinol Nausea Only and Other (See Comments)    weakness   Ativan [Lorazepam] Itching   Sulfa Antibiotics Itching   Ultram [Tramadol Hcl] Itching   Valium [Diazepam] Other (See Comments)    Patient states that diazepam doesn't relax, it has the opposite effect.     Social History   Socioeconomic History   Marital status: Married    Spouse name: Not on file  Number of children: 2   Years of education: 76   Highest education level: Master's degree (e.g., MA, MS, MEng, MEd, MSW, MBA)  Occupational History   Occupation: retired  Tobacco Use   Smoking status: Former    Packs/day: 0.10    Years: 26.00    Pack years: 2.60    Types: Cigarettes    Quit date: 03/23/2021    Years since quitting: 0.2   Smokeless tobacco: Never  Vaping Use   Vaping Use: Never used  Substance and Sexual Activity   Alcohol use: No   Drug use: No   Sexual activity: Yes  Other Topics Concern   Not on file  Social History Narrative   Tobacco Use Cigarettes: Former Smoker, Quit in 2008   No Alcohol   No recreational drug use   Diet: Regular/Low Carb   Exercise: None   Occupation: disabled   Education: Research officer, political party, masters   Children: 2   Firearms: No   Therapist, art Use: 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  Constitutional:      Appearance: Normal appearance. She is normal weight.  HENT:     Head: Normocephalic.     Nose: Nose normal.     Mouth/Throat:     Mouth: Mucous membranes are moist.     Pharynx: Oropharynx is clear.  Eyes:     Conjunctiva/sclera: Conjunctivae normal.     Pupils: Pupils are equal, round, and reactive to light.  Cardiovascular:     Rate and Rhythm: Normal rate and regular rhythm.     Pulses: Normal pulses.     Heart sounds: Normal heart sounds.  Pulmonary:     Effort: Pulmonary effort is normal.     Breath sounds: Wheezing present.  Abdominal:     General: Abdomen is flat.     Palpations: Abdomen is soft.  Musculoskeletal:        General: Swelling present. Normal range of motion.     Cervical back: Normal range of motion.     Right lower leg: Edema present.     Left lower leg: Edema present.  Skin:    General: Skin is warm and dry.     Capillary Refill: Capillary refill takes less than 2 seconds.  Neurological:     General: No focal deficit present.     Mental Status: She is alert. Mental status is at baseline.  Psychiatric:        Mood and Affect: Mood normal.        Future Appointments  Date Time Provider [place]  06/11/2021  2:45 PM Candyce Churn B, PT [facility] None  06/18/2021  2:45 PM Candyce Churn B, PT [facility] None  06/19/2021  3:00 PM [facility] ECHO OP 1 [facility]  06/25/2021  1:40 PM Bensimhon, Shaune Pascal, MD [facility] None  07/10/2021  7:40 AM [facility] DEVICE REMOTES [facility]  07/31/2021  1:00 PM Hazle Coca, PhD [facility] None  07/31/2021  2:00 PM [facility] [place] [facility] None  08/07/2021 10:00 AM Hazle Coca, PhD [facility] None  08/11/2021  7:40 AM [facility]  DEVICE REMOTES [facility]  09/10/2021  7:40 AM [facility] DEVICE REMOTES [facility]  03/04/2022  8:50 AM Patel, Arvin Collard K, DO [facility] None      ACTION: Home visit completed Home visit completed

## 2021-06-11 ENCOUNTER — Other Ambulatory Visit (HOSPITAL_COMMUNITY): Payer: Self-pay

## 2021-06-11 ENCOUNTER — Telehealth: Payer: Self-pay

## 2021-06-11 MED ORDER — SIMVASTATIN 10 MG PO TABS: 10.0000 mg | ORAL_TABLET | Freq: Every evening | ORAL | 11 refills | Status: DC

## 2021-06-11 NOTE — Telephone Encounter (Signed)
Message received from [facility] front desk, patient left message with them requesting call back Call placed to pt reference PREP Patient only attended 5 sessions of last prep class that finished on 04/14/21 due to illness. Wants to try again.  Advised will call her when I have another class starting.

## 2021-06-12 ENCOUNTER — Telehealth: Payer: Self-pay

## 2021-06-12 NOTE — Progress Notes (Signed)
EPIC Encounter for ICM Monitoring  Patient Name: Rhonda [name] is a 71 y.o. female Date: 06/12/2021 Primary Care Physican: Ardeen Garland, PA-C Primary Cardiologist: [place] Electrophysiologist: Santina Evans Pacing: 98.7%   05/06/2021  Weight: 224 lbs      Attempted call to patient and unable to reach.  Left detailed message per DPR regarding transmission. Transmission reviewed.    Optivol thoracic impedance suggesting normal fluid levels.    Prescribed:  Patient participating in paramedicine program to assist with meds. Torsemide 20 mg 4 tablets (80 mg total) by mouth every morning and 3 tablets (60 mg total) every evening.   Potassium  20 meq 2 tablets (40 mEq total) by mouth 3 (three) times daily.  Take extra 2 tablets on days you take Metolazone. Metolazone 2.5mg  1 tablet every Tuesday. Spironolactone 25 mg take 1 tablet by mouth every evening.   Labs: 05/01/2021 Creatinine 1.81, BUN 45, Potassium 3.7, Sodium 140, GFR 30 04/23/2021 Creatinine 1.71, BUN 33, Potassium 2.9, Sodium 137, GFR 32 0/26/2022 Creatinine 1.58, BUN 29, Potassium 3.9, Sodium 138, GFR 35, BNP 68.6 A complete set of results can be found in Results Review.   Recommendations:  Left voice mail with ICM number and encouraged to call if experiencing any fluid symptoms.   Follow-up plan: ICM clinic phone appointment on 07/14/2021.   91 day device clinic remote transmission 07/10/2021.     EP/Cardiology Office Visits:  06/25/2021 with Dr Haroldine Laws.  Recall 07/26/2021 with Dr Lovena Le.     Copy of ICM check sent to Dr. Lovena Le.      3 month ICM trend: 06/09/2021.    12-14 Month ICM trend:     [name], RN 06/12/2021 9:03 AM

## 2021-06-12 NOTE — Telephone Encounter (Signed)
Remote ICM transmission received.  Attempted call to patient regarding ICM remote transmission and left detailed message per DPR.  Advised to return call for any fluid symptoms or questions. Next ICM remote transmission scheduled 05/13/2022.

## 2021-06-16 ENCOUNTER — Other Ambulatory Visit (HOSPITAL_COMMUNITY): Payer: Self-pay | Admitting: Internal Medicine

## 2021-06-17 ENCOUNTER — Other Ambulatory Visit (HOSPITAL_COMMUNITY): Payer: Self-pay

## 2021-06-17 NOTE — Progress Notes (Signed)
Paramedicine Encounter    Patient ID: Rhonda [name], female    DOB: 17-Jun-1950, 71 y.o.   MRN: [id number]  Arrived for med rec for [name] today. She reports she is feeling okay just depressed. She is interested in seeking outpatient therapy. I will send message to Eliezer [facility] to see if we have any resources for her. She reports she has called her insurance and they did not have any accepting new patients.   I reviewed meds and filled pill box accordingly.   She advised she had an appointment with [facility] last week. Her husband normally takes notes but he was not home to share same. I advised her to have him call me and update me.   Home visit complete.   Refills: Omeprazole     ACTION: Home visit completed

## 2021-06-18 ENCOUNTER — Telehealth (HOSPITAL_COMMUNITY): Payer: Self-pay | Admitting: Licensed Clinical Social Worker

## 2021-06-18 NOTE — Telephone Encounter (Signed)
Community Paramedic requested help with finding pt mental health provider.  CSW called pt to discuss- she reports generalized feelings of depression due to recent conflict with her daughter and lack of support at home (husband works long hours).  Reports she does have friends who she talks to and supports her but states that she wants to talk to someone who doesn't know her.  Used to see a mental health provider who prescribed her medications but she does not want to go back to them- wants someone who does talk therapy.  CSW found list of mental health providers through pt insurance website and mailed to patient- she will make calls to get appt.  [name], LCSW Clinical Social Worker [facility] Desk#: [phone] Cell#: [phone]

## 2021-06-19 ENCOUNTER — Ambulatory Visit (HOSPITAL_COMMUNITY)
Admission: RE | Admit: 2021-06-19 | Discharge: 2021-06-19 | Disposition: A | Discharge status: Home / Self Care | Payer: Medicare HMO | Source: Ambulatory Visit | Attending: Physician Assistant | Admitting: Physician Assistant

## 2021-06-19 ENCOUNTER — Other Ambulatory Visit: Payer: Self-pay

## 2021-06-19 DIAGNOSIS — I5022 Chronic systolic (congestive) heart failure: Secondary | ICD-10-CM | POA: Insufficient documentation

## 2021-06-19 DIAGNOSIS — I447 Left bundle-branch block, unspecified: Secondary | ICD-10-CM | POA: Insufficient documentation

## 2021-06-19 DIAGNOSIS — E119 Type 2 diabetes mellitus without complications: Secondary | ICD-10-CM | POA: Insufficient documentation

## 2021-06-19 DIAGNOSIS — E785 Hyperlipidemia, unspecified: Secondary | ICD-10-CM | POA: Diagnosis not present

## 2021-06-19 DIAGNOSIS — E039 Hypothyroidism, unspecified: Secondary | ICD-10-CM | POA: Diagnosis not present

## 2021-06-19 DIAGNOSIS — G4700 Insomnia, unspecified: Secondary | ICD-10-CM | POA: Diagnosis not present

## 2021-06-19 DIAGNOSIS — N183 Chronic kidney disease, stage 3 unspecified: Secondary | ICD-10-CM | POA: Diagnosis not present

## 2021-06-19 DIAGNOSIS — Z95810 Presence of automatic (implantable) cardiac defibrillator: Secondary | ICD-10-CM | POA: Diagnosis not present

## 2021-06-19 DIAGNOSIS — I10 Essential (primary) hypertension: Secondary | ICD-10-CM | POA: Diagnosis not present

## 2021-06-19 DIAGNOSIS — F330 Major depressive disorder, recurrent, mild: Secondary | ICD-10-CM | POA: Diagnosis not present

## 2021-06-19 DIAGNOSIS — I110 Hypertensive heart disease with heart failure: Secondary | ICD-10-CM | POA: Diagnosis not present

## 2021-06-19 DIAGNOSIS — J45909 Unspecified asthma, uncomplicated: Secondary | ICD-10-CM | POA: Diagnosis not present

## 2021-06-19 DIAGNOSIS — E1140 Type 2 diabetes mellitus with diabetic neuropathy, unspecified: Secondary | ICD-10-CM | POA: Diagnosis not present

## 2021-06-19 LAB — ECHOCARDIOGRAM COMPLETE: S' Lateral: 2.55 cm

## 2021-06-24 ENCOUNTER — Telehealth (HOSPITAL_COMMUNITY): Payer: Self-pay

## 2021-06-24 NOTE — Telephone Encounter (Signed)
Spoke to Mrs. Touhey and confirmed appointment at HF clinic tomorrow at 1:40. She agreed and understands to bring all meds and pill box. Call complete.

## 2021-06-25 ENCOUNTER — Other Ambulatory Visit (HOSPITAL_COMMUNITY): Payer: Self-pay

## 2021-06-25 ENCOUNTER — Encounter (HOSPITAL_COMMUNITY): Payer: Self-pay | Admitting: Internal Medicine

## 2021-06-25 ENCOUNTER — Other Ambulatory Visit: Payer: Self-pay

## 2021-06-25 ENCOUNTER — Ambulatory Visit (HOSPITAL_COMMUNITY)
Admission: RE | Admit: 2021-06-25 | Discharge: 2021-06-25 | Disposition: A | Discharge status: Home / Self Care | Payer: Medicare HMO | Source: Ambulatory Visit | Attending: Internal Medicine | Admitting: Internal Medicine

## 2021-06-25 ENCOUNTER — Encounter: Payer: Self-pay | Admitting: Internal Medicine

## 2021-06-25 VITALS — BP 130/82 | HR 88 | Ht 60.0 in | Wt 226.0 lb

## 2021-06-25 DIAGNOSIS — Z9981 Dependence on supplemental oxygen: Secondary | ICD-10-CM | POA: Diagnosis not present

## 2021-06-25 DIAGNOSIS — I428 Other cardiomyopathies: Secondary | ICD-10-CM | POA: Insufficient documentation

## 2021-06-25 DIAGNOSIS — F32A Depression, unspecified: Secondary | ICD-10-CM | POA: Insufficient documentation

## 2021-06-25 DIAGNOSIS — Z79899 Other long term (current) drug therapy: Secondary | ICD-10-CM | POA: Diagnosis not present

## 2021-06-25 DIAGNOSIS — I951 Orthostatic hypotension: Secondary | ICD-10-CM | POA: Insufficient documentation

## 2021-06-25 DIAGNOSIS — F419 Anxiety disorder, unspecified: Secondary | ICD-10-CM | POA: Diagnosis not present

## 2021-06-25 DIAGNOSIS — I130 Hypertensive heart and chronic kidney disease with heart failure and stage 1 through stage 4 chronic kidney disease, or unspecified chronic kidney disease: Secondary | ICD-10-CM | POA: Diagnosis not present

## 2021-06-25 DIAGNOSIS — N1832 Chronic kidney disease, stage 3b: Secondary | ICD-10-CM | POA: Diagnosis not present

## 2021-06-25 DIAGNOSIS — I10 Essential (primary) hypertension: Secondary | ICD-10-CM

## 2021-06-25 DIAGNOSIS — E1122 Type 2 diabetes mellitus with diabetic chronic kidney disease: Secondary | ICD-10-CM | POA: Diagnosis not present

## 2021-06-25 DIAGNOSIS — J9610 Chronic respiratory failure, unspecified whether with hypoxia or hypercapnia: Secondary | ICD-10-CM | POA: Insufficient documentation

## 2021-06-25 DIAGNOSIS — Z7901 Long term (current) use of anticoagulants: Secondary | ICD-10-CM | POA: Diagnosis not present

## 2021-06-25 DIAGNOSIS — I5022 Chronic systolic (congestive) heart failure: Secondary | ICD-10-CM

## 2021-06-25 DIAGNOSIS — E66813 Obesity, class 3: Secondary | ICD-10-CM

## 2021-06-25 DIAGNOSIS — N183 Chronic kidney disease, stage 3 unspecified: Secondary | ICD-10-CM | POA: Diagnosis not present

## 2021-06-25 DIAGNOSIS — Z6841 Body Mass Index (BMI) 40.0 and over, adult: Secondary | ICD-10-CM | POA: Insufficient documentation

## 2021-06-25 MED ORDER — METOLAZONE 2.5 MG PO TABS: ORAL_TABLET | ORAL | 3 refills | Status: DC

## 2021-06-25 NOTE — Addendum Note (Signed)
Encounter addended by: Shonna Chock, CMA on: 9/0/1222 2:21 PM  Actions taken: Order list changed, Clinical Note Signed

## 2021-06-25 NOTE — Progress Notes (Signed)
Paramedicine Encounter    Patient ID: Rhonda [name], female    DOB: [birth year], 71 y.o.   MRN: [id number]   Arrived for clinic visit for [facility] today where she was seen by Dr. Haroldine Laws. She reports feeling okay today just depressed.   Med changes today: -Metolazone every other week on Tuesdays.  -Stop Florinef, Omeprazole   Pill box filled with changes made.   [facility]- Dr. Joelyn Oms  Next Appointment- 3/7 at 2:40    Dr. Haroldine Laws approved return to PREP Program.   Refills: Carvedilol      ACTION: Home visit completed

## 2021-06-25 NOTE — Patient Instructions (Signed)
No Labs done today.   DECREASE Metoalzone to 1 tablet by mouth every other Tuesday.   No other medication changes were made. Please continue all current medications as prescribed.  Your physician recommends that you schedule a follow-up appointment in:6 months with our NP/PA Clinic here in our office  If you have any questions or concerns before your next appointment please send [place] a message through [place] or call our office at [phone].    TO LEAVE A MESSAGE FOR THE NURSE SELECT OPTION 2, PLEASE LEAVE A MESSAGE INCLUDING: YOUR NAME DATE OF BIRTH CALL BACK NUMBER REASON FOR CALL**this is important as we prioritize the call backs  YOU WILL RECEIVE A CALL BACK THE SAME DAY AS LONG AS YOU CALL BEFORE 4:00 PM   Do the following things EVERYDAY: Weigh yourself in the morning before breakfast. Write it down and keep it in a log. Take your medicines as prescribed Eat low salt foods--Limit salt (sodium) to 2000 mg per day.  Stay as active as you can everyday Limit all fluids for the day to less than 2 liters   At the [facility], you and your health needs are our priority. As part of our continuing mission to provide you with exceptional heart care, we have created designated Provider Care Teams. These Care Teams include your primary Cardiologist (physician) and Advanced Practice Providers (APPs- Physician Assistants and Nurse Practitioners) who all work together to provide you with the care you need, when you need it.   You may see any of the following providers on your designated Care Team at your next follow up: Dr Glori Bickers Dr Haynes Kerns, NP Lyda Jester, [place] Audry Riles, PharmD   Please be sure to bring in all your medications bottles to every appointment.

## 2021-06-25 NOTE — Progress Notes (Addendum)
ADVANCED HF CLINIC NOTE   Patient ID: Rhonda [name], female   DOB: 1950/08/23, 71 y.o.   MRN: [id number]  Primary Cardiologist: Dr Radford Pax  General Surgeon: Dr Donne Hazel  Orthopedic: Dr Alvan Dame  PCP: Dr Justin Mend HF Cardiologist: Dr. Haroldine Laws  HPI: Rhonda [name] is a 71 y.o. 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 She has had sleep study with no  evidence of sleep apnea in remote past.  She has a Medtronic CRT-D device. Echo in 7/18 showed recovery of EF to 55-60%.   Has been followed by Narda Amber in Neurology. Has been on Florinef and midodrine for orthostatic hypotension (failed mestinon).   Here with her husband and Paramedicine.   Saw Dr. Joelyn Oms recently and SCr slightly worse so Florinef stopped.   Says she is doing ok. Having problems with her memory. Denies CP. She is depressed Denies edema, orthopnea or PND. Not very active due to recent URI. Had been going to [employer]    Device interrogation (personally reviewed): No VT. Volume low. Activity level < 1hr Personally reviewed   PYP 4/19 negative TTR  10/14/12 Echo: EF 35% RV ok  04/12/13 Echo: EF 20-25%, LV moderately dilated 10/11/2013 Echo: EF 45-50% 7/18 Echo: EF 55-60%, normal RV size and systolic function, PASP 34 mmHg 06/07/19 Echo: EF 60-65% grade II DD. RV ok  11/24/12 Creatinine 1.23 Potassium 4.1 12/15/12 Creatinine 2.03 Potassium 3.3  12/22/12 Creatinine 1.2 Potassium 3.8 02/23/13 Creatinine 1.68 K 4.4  05/02/13 K 4.2 Creatinine 1.8  10/11/13 K 3.7 Creatinine 1.69 10/17 K 4.1, creatinine 1.35 09/2017: K3.8 Creatinine 1.57  Review of systems complete and found to be negative unless  listed in HPI.   Past Medical History:  Diagnosis Date   Anemia    Arthritis    Right knee   Asthma    Back pain    Disk problem   Breast cancer, stage 1 ([place]) 03/26/2011   Left; completed chemotherapy and radiation treatments   Cardiomyopathy    Chronic respiratory failure ([place]) 1/91/4782   Chronic systolic heart failure (HCC)    a) NICM b) ECHO (03/2013) EF 20-25% c) ECHO (09/2013) EF 45-50%, grade I DD   CKD (chronic kidney disease) stage 3, GFR 30-59 ml/min (HCC) 9/56/2130   Complication of anesthesia    History of low blood pressure after surgery; attributed to lying flat   Diabetes mellitus    "diet controlled" (05/03/2013)   Exertional shortness of breath    Generalized anxiety disorder    GERD (gastroesophageal reflux disease)    Gout    Heart murmur    Hyperlipidemia    Hypertension    Hypokalemia 11/08/2018   Hypothyroidism    Left bundle branch block    s/p CRT-D (04/2013)   Major depressive disorder    Migraines    On home oxygen therapy    "2L suppose to be q night" (05/03/2013)   Orthostatic hypotension 07/28/2017   Peripheral neuropathy    Feet   SVD (spontaneous vaginal delivery)    x 2   Unspecified vitamin D deficiency 03/26/2011   Does not take meds   Current Outpatient Medications  Medication Sig Dispense  Refill   acetaZOLAMIDE (DIAMOX) 125 MG tablet Take 1 tablet (125 mg total) by mouth daily. 90 tablet 3   carvedilol (COREG) 3.125 MG tablet TAKE 1 TABLET (3.125 MG) BY MOUTH TWICE DAILY WITH MEALS 180 tablet 3   citalopram (CELEXA) 40 MG tablet Take 40 mg by mouth daily.     clonazePAM (KLONOPIN) 0.5 MG tablet Take 0.5 mg by mouth daily as needed for anxiety.     dicyclomine (BENTYL) 20 MG tablet Take 20 mg by mouth 3 (three) times daily before meals.     DULoxetine (CYMBALTA) 60 MG capsule Take 60 mg by mouth daily.     fludrocortisone (FLORINEF) 0.1 MG tablet Take 1 tablet (100 mcg total) by mouth 2 (two) times daily. 180 tablet 3   fluticasone  (FLONASE) 50 MCG/ACT nasal spray      gabapentin (NEURONTIN) 300 MG capsule Take 1 capsule by mouth 3 (three) times daily.     gabapentin (NEURONTIN) 800 MG tablet TAKE 1 TABLET AT BEDTIME AS NEEDED FOR NEUROPATHY 30 tablet 1   ipratropium (ATROVENT) 0.03 % nasal spray Place 2 sprays into both nostrils 2 (two) times daily.     levothyroxine (SYNTHROID) 88 MCG tablet TAKE 1 TABLET(88 MCG) BY MOUTH DAILY BEFORE BREAKFAST 90 tablet 3   metolazone (ZAROXOLYN) 2.5 MG tablet TAKE 1 TABLET BY MOUTH EVERY TUESDAY 12 tablet 3   midodrine (PROAMATINE) 10 MG tablet TAKE 1 TABLET THREE TIMES DAILY WITH MEALS 270 tablet 3   mirtazapine (REMERON) 30 MG tablet 1 tablet at bedtime.     Multiple Vitamin (MULTIVITAMIN) tablet Take 1 tablet by mouth daily.     omeprazole (PRILOSEC) 40 MG capsule Take 40 mg by mouth daily.     potassium chloride SA (KLOR-CON) 20 MEQ tablet TAKE 2 TABLETS THREE TIMES DAILY. TAKE AN EXTRA 2 TABLETS ON DAYS YOU TAKE METOLAZONE 572 tablet 0   simvastatin (ZOCOR) 10 MG tablet Take 1 tablet (10 mg total) by mouth every evening. 30 tablet 11   spironolactone (ALDACTONE) 25 MG tablet Take 1 tablet (25 mg total) by mouth every evening. Needs appt for further refills 90 tablet 0   torsemide (DEMADEX) 20 MG tablet Take 4 tablets (80 mg total) by mouth every morning AND 3 tablets (60 mg total) every evening. 630 tablet 3   fexofenadine (ALLEGRA) 180 MG tablet Take 180 mg by mouth 2 (two) times daily. (Patient not taking: Reported on 04/29/2021)     montelukast (SINGULAIR) 10 MG tablet Take 10 mg by mouth at bedtime. (Patient not taking: Reported on 05/13/2021)  1   No current facility-administered medications for this encounter.   BP 130/82    Pulse 88    Ht 5' (1.524 m)    Wt 102.5 kg (226 lb)    SpO2 99%    BMI 44.14 kg/m   Wt Readings from Last 3 Encounters:  06/25/21 102.5 kg (226 lb)  06/10/21 102.5 kg (226 lb)  05/27/21 103 kg (227 lb)    PHYSICAL EXAM: General:  Well appearing. No  resp difficulty HEENT: normal Neck: supple. no JVD. Carotids 2+ bilat; no bruits. No lymphadenopathy or thryomegaly appreciated. Cor: PMI nondisplaced. Regular rate & rhythm. No rubs, gallops or murmurs. Lungs: clear Abdomen: obese soft, nontender, nondistended. No hepatosplenomegaly. No bruits or masses. Good bowel sounds. Extremities: no cyanosis, clubbing, rash, edema Neuro: alert & orientedx3, cranial nerves grossly intact. moves all 4 extremities w/o difficulty. Affect pleasant + varicose veins   ASSESSMENT &  PLAN:  1) Chronic systolic HF with recovered EF/nonischemic cardiomyopathy:  - NICM s/p Medtronic CRT-D, ?Viral myocarditis. Cardiomyopathy dates from 72.  - EF 45-50% (09/2013)  - EF up to 55-60% on 20202.  - PYP 09/02/17 negative for TTR (Grade 0-1, H/CCL 1.2). SPEP with no M-spike.  - Echo 06/07/19: EF 60-65% grade II DD. RV ok.  - Echo 1/23 EF 60-65% RV normal.  - NYHA III Volume status on  torsemide 80 mg q am/60 mg q pm+ metolazone weekly on Tuesdays. With worsening renal function will cut metolazone to every other Tuesday - Recommended she return to [place] program at [facility]. - Continue carvedilol 3.125 mg bid. - Continue spironolactone 25 mg daily.  - No SGT2i with chronic yeast infections. - Wear compression stockings   2) Obesity - Body mass index is 44.14 kg/m. - Weight stable - Resume PREP program.  3) HTN - Blood pressure well controlled. Continue current regimen.   4) Orthostatic Hypotension.  - Neurology managing. She is on diamox for increased ICP - Has been maintained on Florinef and midodrine 10 mg tid.  - Florinef stopped due to AKI. BP stable   5) Chronic Venous stasis - Compression stockings and leg elevation.   6) Falls  - No recent falls. - Improved. Seeing Neurology.   7) CKD 3 - Creatinine baseline 1.4-1.6 - Recently 1.7-1.8  Follows with Dr. Joelyn Oms in Nephrology - If Scr continues to climb can cut back metolazone   Glori Bickers  MD 2:02 PM

## 2021-07-01 ENCOUNTER — Other Ambulatory Visit (HOSPITAL_COMMUNITY): Payer: Self-pay | Admitting: Internal Medicine

## 2021-07-01 ENCOUNTER — Other Ambulatory Visit (HOSPITAL_COMMUNITY): Payer: Self-pay

## 2021-07-01 NOTE — Progress Notes (Signed)
Paramedicine Encounter    Patient ID: Rhonda [name], female    DOB: Mar 17, 1951, 71 y.o.   MRN: [id number]    Arrived for home visit for Rhonda [name] who reports feeling okay just short of breath. She denied chest pain, dizziness. She has been taking her medications correctly and reports no trouble completing daily tasks.  No swelling or edema noted. Lungs clear. Weight up 7lbs this week from last week.  She is now taking metolazone every other week. So no metolazone taken today. Next dose is next Tues. We will continue to monitor closely. She reports eating more fast food over the weekend. We discussed the importance of refraining from that. She agreed. She restarts PT tomorrow. Hopes to restart PREP after PT is over.     Assessment and vitals obtained. Meds reviewed and confirmed. Pillbox filled and confirmed.    Appointments confirmed and wrote down on her calendar.   Home visit complete.    Refills: Carvedilol Torsemide Celexa    Patient Care Team: Ardeen Garland, PA-C as PCP - General (Family Medicine) [name], LCSW as Social Worker (Licensed Clinical Social Worker) Alda Berthold, DO as Consulting Physician (Neurology)  Patient Active Problem List   Diagnosis Date Noted   Lumbago without sciatica 04/30/2021   Abnormal gait 06/11/2020   Adult failure to thrive syndrome 06/11/2020   Allergic rhinitis 06/11/2020   Anxiety 06/11/2020   Asthma 06/11/2020   Benign intracranial hypertension 06/11/2020   Body mass index (BMI) 45.0-49.9, adult ([place]) 06/11/2020   Bowel incontinence 06/11/2020   Cholelithiasis 06/11/2020   Chronic sinusitis 06/11/2020   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   Daytime somnolence 06/11/2020   Diarrhea of presumed infectious origin 06/11/2020   Edema 06/11/2020   Family history of malignant neoplasm of gastrointestinal tract 06/11/2020   Gout 06/11/2020   History of fall 06/11/2020   History of infectious disease 06/11/2020   Insomnia  06/11/2020   Irregular bowel habits 06/11/2020   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   Lumbar spondylosis with myelopathy 06/11/2020   Macrocytosis 06/11/2020   Mild recurrent major depression ([place]) 06/11/2020   Personal history of colonic polyps 06/11/2020   Personal history of malignant neoplasm of breast 06/11/2020   Repeated falls 06/11/2020   Mixed anxiety and depressive disorder 06/11/2020   Irritable bowel syndrome 01/04/2020   Spinal stenosis of lumbar region 01/03/2020   Other symptoms and signs involving the musculoskeletal system 09/11/2019   Bilateral leg weakness 08/01/2019   Leg swelling 08/01/2019   Diabetes mellitus with neuropathy ([place]) 06/15/2019   Hypokalemia 11/08/2018   CKD (chronic kidney disease) stage 3, GFR 30-59 ml/min (HCC) 11/08/2018   Orthostatic hypotension 07/28/2017   SVD (spontaneous vaginal delivery)    Peripheral neuropathy    On home oxygen therapy    Migraines    Left bundle branch block    Hypothyroidism    Hypertension    Hyperlipidemia    Heart murmur    GERD (gastroesophageal reflux disease)    Exertional shortness of breath    Major depressive disorder    Back pain    Arthritis    Generalized anxiety disorder    Anemia    Chronic respiratory failure ([place]) 09/14/2013   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   Morbid obesity ([place]) 19/62/2297   Chronic systolic heart failure ([place]) 10/27/2012   Endometrial polyp 01/20/2012   Malignant tumor of breast ([place]) 03/26/2011   Unspecified vitamin D deficiency 03/26/2011    Current Outpatient  Medications:    acetaZOLAMIDE (DIAMOX) 125 MG tablet, Take 1 tablet (125 mg total) by mouth daily., Disp: 90 tablet, Rfl: 3   carvedilol (COREG) 3.125 MG tablet, TAKE 1 TABLET (3.125 MG) BY MOUTH TWICE DAILY WITH MEALS, Disp: 180 tablet, Rfl: 3   citalopram (CELEXA) 40 MG tablet, Take 40 mg by mouth daily., Disp: , Rfl:    clonazePAM (KLONOPIN) 0.5 MG tablet, Take 0.5  mg by mouth daily as needed for anxiety., Disp: , Rfl:    dicyclomine (BENTYL) 20 MG tablet, Take 20 mg by mouth 3 (three) times daily before meals., Disp: , Rfl:    DULoxetine (CYMBALTA) 60 MG capsule, Take 60 mg by mouth daily., Disp: , Rfl:    fexofenadine (ALLEGRA) 180 MG tablet, Take 180 mg by mouth 2 (two) times daily. (Patient not taking: Reported on 04/29/2021), Disp: , Rfl:    fludrocortisone (FLORINEF) 0.1 MG tablet, Take 1 tablet (100 mcg total) by mouth 2 (two) times daily., Disp: 180 tablet, Rfl: 3   fluticasone (FLONASE) 50 MCG/ACT nasal spray, , Disp: , Rfl:    gabapentin (NEURONTIN) 300 MG capsule, Take 1 capsule by mouth 3 (three) times daily., Disp: , Rfl:    gabapentin (NEURONTIN) 800 MG tablet, TAKE 1 TABLET AT BEDTIME AS NEEDED FOR NEUROPATHY, Disp: 30 tablet, Rfl: 1   ipratropium (ATROVENT) 0.03 % nasal spray, Place 2 sprays into both nostrils 2 (two) times daily., Disp: , Rfl:    levothyroxine (SYNTHROID) 88 MCG tablet, TAKE 1 TABLET(88 MCG) BY MOUTH DAILY BEFORE BREAKFAST, Disp: 90 tablet, Rfl: 3   metolazone (ZAROXOLYN) 2.5 MG tablet, Take 1 tablet by mouth every other Tuesday., Disp: 12 tablet, Rfl: 3   midodrine (PROAMATINE) 10 MG tablet, TAKE 1 TABLET THREE TIMES DAILY WITH MEALS, Disp: 270 tablet, Rfl: 3   mirtazapine (REMERON) 30 MG tablet, 1 tablet at bedtime., Disp: , Rfl:    montelukast (SINGULAIR) 10 MG tablet, Take 10 mg by mouth at bedtime. (Patient not taking: Reported on 05/13/2021), Disp: , Rfl: 1   Multiple Vitamin (MULTIVITAMIN) tablet, Take 1 tablet by mouth daily., Disp: , Rfl:    omeprazole (PRILOSEC) 40 MG capsule, Take 40 mg by mouth daily., Disp: , Rfl:    potassium chloride SA (KLOR-CON) 20 MEQ tablet, TAKE 2 TABLETS THREE TIMES DAILY. TAKE AN EXTRA 2 TABLETS ON DAYS YOU TAKE METOLAZONE, Disp: 572 tablet, Rfl: 0   simvastatin (ZOCOR) 10 MG tablet, Take 1 tablet (10 mg total) by mouth every evening., Disp: 30 tablet, Rfl: 11   spironolactone (ALDACTONE)  25 MG tablet, Take 1 tablet (25 mg total) by mouth every evening. Needs appt for further refills, Disp: 90 tablet, Rfl: 0   torsemide (DEMADEX) 20 MG tablet, Take 4 tablets (80 mg total) by mouth every morning AND 3 tablets (60 mg total) every evening., Disp: 630 tablet, Rfl: 3 Allergies  Allergen Reactions   Ceftin Anaphylaxis    Face and throat swell    Geodon [Ziprasidone Hcl] Hives   Lisinopril Other (See Comments)    angioedema   Shellfish Allergy Other (See Comments)    Gout exacerbation   Shellfish-Derived Products     Other reaction(s): Other   Allopurinol Nausea Only and Other (See Comments)    weakness   Ativan [Lorazepam] Itching   Sulfa Antibiotics Itching   Ultram [Tramadol Hcl] Itching   Ultram [Tramadol] Itching   Valium [Diazepam] Other (See Comments)    Patient states that diazepam doesn't relax, it has  the opposite effect.     Social History   Socioeconomic History   Marital status: Married    Spouse name: Not on file   Number of children: 2   Years of education: 61   Highest education level: Master's degree (e.g., MA, MS, MEng, MEd, MSW, MBA)  Occupational History   Occupation: retired  Tobacco Use   Smoking status: Former    Packs/day: 0.10    Years: 26.00    Pack years: 2.60    Types: Cigarettes    Quit date: 03/23/2021    Years since quitting: 0.2   Smokeless tobacco: Never  Vaping Use   Vaping Use: Never used  Substance and Sexual Activity   Alcohol use: No   Drug use: No   Sexual activity: Yes  Other Topics Concern   Not on file  Social History Narrative   Tobacco Use Cigarettes: Former Smoker, Quit in 2008   No Alcohol   No recreational drug use   Diet: Regular/Low Carb   Exercise: None   Occupation: disabled   Education: Research officer, political party, masters   Children: 2   Firearms: No   Therapist, art Use: 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      Social Determinants of Health   Financial Resource Strain: Not on file   Food Insecurity: Not on file  Transportation Needs: Not on file  Physical Activity: Not on file  Stress: Not on file  Social Connections: Not on file  Intimate Partner Violence: Not on file    Physical Exam Vitals reviewed.  Constitutional:      Appearance: Normal appearance. She is normal weight.  HENT:     Head: Normocephalic.     Nose: Nose normal.     Mouth/Throat:     Mouth: Mucous membranes are moist.     Pharynx: Oropharynx is clear.  Eyes:     Conjunctiva/sclera: Conjunctivae normal.     Pupils: Pupils are equal, round, and reactive to light.  Cardiovascular:     Rate and Rhythm: Normal rate and regular rhythm.     Pulses: Normal pulses.     Heart sounds: Normal heart sounds.  Pulmonary:     Effort: Pulmonary effort is normal.     Breath sounds: Normal breath sounds.  Abdominal:     General: Abdomen is flat.     Palpations: Abdomen is soft.  Musculoskeletal:        General: No swelling. Normal range of motion.     Cervical back: Normal range of motion.     Right lower leg: No edema.     Left lower leg: No edema.  Skin:    General: Skin is warm and dry.     Capillary Refill: Capillary refill takes less than 2 seconds.  Neurological:     General: No focal deficit present.     Mental Status: She is alert. Mental status is at baseline.  Psychiatric:        Mood and Affect: Mood normal.        Future Appointments  Date Time Provider [place]  07/02/2021  2:00 PM Candyce Churn B, PT [facility] None  07/10/2021  7:40 AM [facility] DEVICE REMOTES [facility]  07/14/2021  7:20 AM [facility] DEVICE REMOTES [facility]  07/31/2021  1:00 PM Hazle Coca, PhD [facility] None  07/31/2021  2:00 PM [facility] TECH [facility] None  08/07/2021 10:00 AM Hazle Coca, PhD [facility] None  08/11/2021  7:40 AM [facility] DEVICE REMOTES [facility]  09/10/2021  7:40 AM [facility] DEVICE REMOTES [facility]   12/25/2021  2:30 PM [facility] PA/NP [facility] None  03/04/2022  8:50 AM Patel, Arvin Collard K, DO [facility] None     ACTION: Home visit completed

## 2021-07-02 ENCOUNTER — Ambulatory Visit: Payer: Medicare HMO | Attending: Family Medicine

## 2021-07-02 DIAGNOSIS — G8929 Other chronic pain: Secondary | ICD-10-CM | POA: Insufficient documentation

## 2021-07-02 DIAGNOSIS — M6281 Muscle weakness (generalized): Secondary | ICD-10-CM | POA: Insufficient documentation

## 2021-07-02 DIAGNOSIS — R296 Repeated falls: Secondary | ICD-10-CM | POA: Insufficient documentation

## 2021-07-02 DIAGNOSIS — R262 Difficulty in walking, not elsewhere classified: Secondary | ICD-10-CM | POA: Insufficient documentation

## 2021-07-02 DIAGNOSIS — M545 Low back pain, unspecified: Secondary | ICD-10-CM | POA: Insufficient documentation

## 2021-07-03 ENCOUNTER — Telehealth: Payer: Self-pay

## 2021-07-03 NOTE — Telephone Encounter (Signed)
(  2:54 pm) PC SW scheduled a follow-up visit with Dr. [name] for patient.  Visit scheduled for 07/24/21 @ 10 am.

## 2021-07-08 ENCOUNTER — Other Ambulatory Visit (HOSPITAL_COMMUNITY): Payer: Self-pay

## 2021-07-08 NOTE — Progress Notes (Signed)
Paramedicine Encounter    Patient ID: Rhonda [name], female    DOB: 1951-02-17, 71 y.o.   MRN: [id number]   Arrived for home visit for [name] who reports feeling okay just depressed and having some coughing with mucus production. This is an ongoing issue for Rhonda [name] I suggested she get OTC allergy medication as she used to take but stopped due to forgetting to pick it up at the pharmacy. She stated she will have her husband pick it up. I also gave her the number to [place]. for mental health follow up. She was grateful for this.   I obtained assessment and vitals.  No lower leg swelling today. Weight is down this week.   I reviewed meds and confirmed same filling pill box accordingly.   I called in remeron for refill at [facility] and they stated they did not have a record of her taking it. I will reach out to her PCP for same.   We reviewed appointments. Home visit complete. I will see Rhonda [name] in one week.   Refills: Remeron Torsemide Carvedilol Gabapentin  Citalopram    Patient Care Team: Ardeen Garland, PA-C as PCP - General (Family Medicine) [name], LCSW as Social Worker (Licensed Clinical Social Worker) Alda Berthold, DO as Consulting Physician (Neurology)  Patient Active Problem List   Diagnosis Date Noted   Lumbago without sciatica 04/30/2021   Abnormal gait 06/11/2020   Adult failure to thrive syndrome 06/11/2020   Allergic rhinitis 06/11/2020   Anxiety 06/11/2020   Asthma 06/11/2020   Benign intracranial hypertension 06/11/2020   Body mass index (BMI) 45.0-49.9, adult ([place]) 06/11/2020   Bowel incontinence 06/11/2020   Cholelithiasis 06/11/2020   Chronic sinusitis 06/11/2020   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   Daytime somnolence 06/11/2020   Diarrhea of presumed infectious origin 06/11/2020   Edema 06/11/2020   Family history of malignant neoplasm of gastrointestinal tract 06/11/2020   Gout 06/11/2020   History of fall  06/11/2020   History of infectious disease 06/11/2020   Insomnia 06/11/2020   Irregular bowel habits 06/11/2020   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   Lumbar spondylosis with myelopathy 06/11/2020   Macrocytosis 06/11/2020   Mild recurrent major depression ([place]) 06/11/2020   Personal history of colonic polyps 06/11/2020   Personal history of malignant neoplasm of breast 06/11/2020   Repeated falls 06/11/2020   Mixed anxiety and depressive disorder 06/11/2020   Irritable bowel syndrome 01/04/2020   Spinal stenosis of lumbar region 01/03/2020   Other symptoms and signs involving the musculoskeletal system 09/11/2019   Bilateral leg weakness 08/01/2019   Leg swelling 08/01/2019   Diabetes mellitus with neuropathy ([place]) 06/15/2019   Hypokalemia 11/08/2018   CKD (chronic kidney disease) stage 3, GFR 30-59 ml/min (HCC) 11/08/2018   Orthostatic hypotension 07/28/2017   SVD (spontaneous vaginal delivery)    Peripheral neuropathy    On home oxygen therapy    Migraines    Left bundle branch block    Hypothyroidism    Hypertension    Hyperlipidemia    Heart murmur    GERD (gastroesophageal reflux disease)    Exertional shortness of breath    Major depressive disorder    Back pain    Arthritis    Generalized anxiety disorder    Anemia    Chronic respiratory failure ([place]) 09/14/2013   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   Morbid obesity ([place]) 97/06/6376   Chronic systolic heart failure ([place]) 10/27/2012   Endometrial  polyp 01/20/2012   Malignant tumor of breast ([place]) 03/26/2011   Unspecified vitamin D deficiency 03/26/2011    Current Outpatient Medications:    acetaZOLAMIDE (DIAMOX) 125 MG tablet, Take 1 tablet (125 mg total) by mouth daily., Disp: 90 tablet, Rfl: 3   carvedilol (COREG) 3.125 MG tablet, TAKE 1 TABLET (3.125 MG) BY MOUTH TWICE DAILY WITH MEALS, Disp: 180 tablet, Rfl: 3   citalopram (CELEXA) 40 MG tablet, Take 40 mg by mouth  daily., Disp: , Rfl:    clonazePAM (KLONOPIN) 0.5 MG tablet, Take 0.5 mg by mouth daily as needed for anxiety., Disp: , Rfl:    dicyclomine (BENTYL) 20 MG tablet, Take 20 mg by mouth 3 (three) times daily before meals., Disp: , Rfl:    DULoxetine (CYMBALTA) 60 MG capsule, Take 60 mg by mouth daily., Disp: , Rfl:    fexofenadine (ALLEGRA) 180 MG tablet, Take 180 mg by mouth 2 (two) times daily. (Patient not taking: Reported on 04/29/2021), Disp: , Rfl:    fludrocortisone (FLORINEF) 0.1 MG tablet, Take 1 tablet (100 mcg total) by mouth 2 (two) times daily. (Patient not taking: Reported on 07/01/2021), Disp: 180 tablet, Rfl: 3   fluticasone (FLONASE) 50 MCG/ACT nasal spray, , Disp: , Rfl:    gabapentin (NEURONTIN) 300 MG capsule, Take 1 capsule by mouth 3 (three) times daily., Disp: , Rfl:    gabapentin (NEURONTIN) 800 MG tablet, TAKE 1 TABLET AT BEDTIME AS NEEDED FOR NEUROPATHY, Disp: 30 tablet, Rfl: 1   ipratropium (ATROVENT) 0.03 % nasal spray, Place 2 sprays into both nostrils 2 (two) times daily., Disp: , Rfl:    levothyroxine (SYNTHROID) 88 MCG tablet, TAKE 1 TABLET(88 MCG) BY MOUTH DAILY BEFORE BREAKFAST, Disp: 90 tablet, Rfl: 3   metolazone (ZAROXOLYN) 2.5 MG tablet, Take 1 tablet by mouth every other Tuesday., Disp: 12 tablet, Rfl: 3   midodrine (PROAMATINE) 10 MG tablet, TAKE 1 TABLET THREE TIMES DAILY WITH MEALS, Disp: 270 tablet, Rfl: 3   mirtazapine (REMERON) 30 MG tablet, 1 tablet at bedtime., Disp: , Rfl:    montelukast (SINGULAIR) 10 MG tablet, Take 10 mg by mouth at bedtime. (Patient not taking: Reported on 05/13/2021), Disp: , Rfl: 1   Multiple Vitamin (MULTIVITAMIN) tablet, Take 1 tablet by mouth daily., Disp: , Rfl:    omeprazole (PRILOSEC) 40 MG capsule, Take 40 mg by mouth daily. (Patient not taking: Reported on 07/01/2021), Disp: , Rfl:    potassium chloride SA (KLOR-CON) 20 MEQ tablet, TAKE 2 TABLETS THREE TIMES DAILY. TAKE AN EXTRA 2 TABLETS ON DAYS YOU TAKE METOLAZONE, Disp: 572  tablet, Rfl: 0   simvastatin (ZOCOR) 10 MG tablet, Take 1 tablet (10 mg total) by mouth every evening., Disp: 30 tablet, Rfl: 11   spironolactone (ALDACTONE) 25 MG tablet, Take 1 tablet (25 mg total) by mouth every evening. Needs appt for further refills, Disp: 90 tablet, Rfl: 0   torsemide (DEMADEX) 20 MG tablet, Take 1 tablet (20 mg total) by mouth 2 (two) times daily., Disp: 540 tablet, Rfl: 3 Allergies  Allergen Reactions   Ceftin Anaphylaxis    Face and throat swell    Geodon [Ziprasidone Hcl] Hives   Lisinopril Other (See Comments)    angioedema   Shellfish Allergy Other (See Comments)    Gout exacerbation   Shellfish-Derived Products     Other reaction(s): Other   Allopurinol Nausea Only and Other (See Comments)    weakness   Ativan [Lorazepam] Itching   Sulfa Antibiotics Itching  Ultram [Tramadol Hcl] Itching   Ultram [Tramadol] Itching   Valium [Diazepam] Other (See Comments)    Patient states that diazepam doesn't relax, it has the opposite effect.     Social History   Socioeconomic History   Marital status: Married    Spouse name: Not on file   Number of children: 2   Years of education: 92   Highest education level: Master's degree (e.g., MA, MS, MEng, MEd, MSW, MBA)  Occupational History   Occupation: retired  Tobacco Use   Smoking status: Former    Packs/day: 0.10    Years: 26.00    Pack years: 2.60    Types: Cigarettes    Quit date: 03/23/2021    Years since quitting: 0.2   Smokeless tobacco: Never  Vaping Use   Vaping Use: Never used  Substance and Sexual Activity   Alcohol use: No   Drug use: No   Sexual activity: Yes  Other Topics Concern   Not on file  Social History Narrative   Tobacco Use Cigarettes: Former Smoker, Quit in 2008   No Alcohol   No recreational drug use   Diet: Regular/Low Carb   Exercise: None   Occupation: disabled   Education: Research officer, political party, masters   Children: 2   Firearms: No   Therapist, art Use: Always   Former Conservation officer, historic buildings.    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      Social Determinants of Health   Financial Resource Strain: Not on file  Food Insecurity: Not on file  Transportation Needs: Not on file  Physical Activity: Not on file  Stress: Not on file  Social Connections: Not on file  Intimate Partner Violence: Not on file    Physical Exam Vitals reviewed.  Constitutional:      Appearance: Normal appearance. She is normal weight.  HENT:     Head: Normocephalic.     Nose: Nose normal.     Mouth/Throat:     Mouth: Mucous membranes are moist.     Pharynx: Oropharynx is clear.  Eyes:     Conjunctiva/sclera: Conjunctivae normal.     Pupils: Pupils are equal, round, and reactive to light.  Cardiovascular:     Rate and Rhythm: Normal rate and regular rhythm.     Pulses: Normal pulses.     Heart sounds: Normal heart sounds.  Pulmonary:     Effort: Pulmonary effort is normal.     Breath sounds: Normal breath sounds.  Abdominal:     General: Abdomen is flat.     Palpations: Abdomen is soft.  Musculoskeletal:        General: No swelling. Normal range of motion.     Cervical back: Normal range of motion.     Right lower leg: No edema.     Left lower leg: No edema.  Skin:    General: Skin is warm and dry.     Capillary Refill: Capillary refill takes less than 2 seconds.  Neurological:     General: No focal deficit present.     Mental Status: She is alert. Mental status is at baseline.  Psychiatric:        Mood and Affect: Mood normal.        Future Appointments  Date Time Provider [place]  07/10/2021  7:40 AM [facility] DEVICE REMOTES [facility]  07/14/2021  7:20 AM [facility] DEVICE REMOTES [facility]  07/24/2021 10:00 AM Reed, Tiffany L, DO ACP-ACP None  07/31/2021  1:00 PM Hazle Coca, PhD [facility] None  07/31/2021  2:00 PM LBN- [place] [facility] None  08/07/2021 10:00 AM Hazle Coca, PhD [facility] None  08/11/2021  7:40 AM [facility]  DEVICE REMOTES [facility]  09/10/2021  7:40 AM [facility] DEVICE REMOTES [facility]  12/25/2021  2:30 PM [facility] PA/NP [facility] None  03/04/2022  8:50 AM Patel, Arvin Collard K, DO L[facility] None     ACTION: Home visit completed

## 2021-07-10 ENCOUNTER — Ambulatory Visit (INDEPENDENT_AMBULATORY_CARE_PROVIDER_SITE_OTHER): Payer: Medicare HMO

## 2021-07-10 DIAGNOSIS — I428 Other cardiomyopathies: Secondary | ICD-10-CM | POA: Diagnosis not present

## 2021-07-10 LAB — CUP PACEART REMOTE DEVICE CHECK
Battery Remaining Longevity: 81 mo
Battery Voltage: 2.99 V
Brady Statistic AP VP Percent: 0.04 %
Brady Statistic AP VS Percent: 0.01 %
Brady Statistic AS VP Percent: 98.78 %
Brady Statistic AS VS Percent: 1.17 %
Brady Statistic RA Percent Paced: 0.05 %
Brady Statistic RV Percent Paced: 79.56 %
Date Time Interrogation Session: 20230216001704
HighPow Impedance: 83 Ohm
Implantable Lead Implant Date: 20141210
Implantable Lead Implant Date: 20141210
Implantable Lead Implant Date: 20141210
Implantable Lead Location: 753858
Implantable Lead Location: 753859
Implantable Lead Location: 753860
Implantable Lead Model: 4396
Implantable Lead Model: 5076
Implantable Lead Model: 6935
Implantable Pulse Generator Implant Date: 20211118
Lead Channel Impedance Value: 361 Ohm
Lead Channel Impedance Value: 399 Ohm
Lead Channel Impedance Value: 456 Ohm
Lead Channel Impedance Value: 532 Ohm
Lead Channel Impedance Value: 608 Ohm
Lead Channel Impedance Value: 646 Ohm
Lead Channel Pacing Threshold Amplitude: 0.5 V
Lead Channel Pacing Threshold Amplitude: 0.875 V
Lead Channel Pacing Threshold Amplitude: 1.25 V
Lead Channel Pacing Threshold Pulse Width: 0.4 ms
Lead Channel Pacing Threshold Pulse Width: 0.4 ms
Lead Channel Pacing Threshold Pulse Width: 0.4 ms
Lead Channel Sensing Intrinsic Amplitude: 2.75 mV
Lead Channel Sensing Intrinsic Amplitude: 2.75 mV
Lead Channel Sensing Intrinsic Amplitude: 22.625 mV
Lead Channel Sensing Intrinsic Amplitude: 22.625 mV
Lead Channel Setting Pacing Amplitude: 1.5 V
Lead Channel Setting Pacing Amplitude: 2 V
Lead Channel Setting Pacing Amplitude: 2.5 V
Lead Channel Setting Pacing Pulse Width: 0.4 ms
Lead Channel Setting Pacing Pulse Width: 0.4 ms
Lead Channel Setting Sensing Sensitivity: 0.3 mV

## 2021-07-15 ENCOUNTER — Other Ambulatory Visit (HOSPITAL_COMMUNITY): Payer: Self-pay

## 2021-07-15 NOTE — Progress Notes (Signed)
Remote ICD transmission.   

## 2021-07-15 NOTE — Progress Notes (Signed)
Paramedicine Encounter    Patient ID: Rhonda [name], female    DOB: 08-May-1951, 72 y.o.   MRN: [id number]  Arrived for home visit for Rhonda [name] who reports feeling okay just depressed. She denied dizziness, shortness of breath or chest pain. Rhonda [name] expressed interest in wanting to re-start the [facility] program. I will assist her and reach out to staff.   Vitals and assessment obtained and no edema or swelling noted. Lungs clear. Weight up 8lbs this week. She is not weighing daily as asked but last week was 225 and this week 233. I stressed the importance of weighing daily and she verbalized understanding.    I reviewed all meds and confirmed same, she was compliant with pill box aside from a few noon and bedtime doses. I re-educated her on the importance of taking all medications as prescribed.   Pill box filled for one week.   Appointments reviewed.   I reminded Rhonda [name] to reach out to her insurance company for guidance on in network therapist. I will continue to assist.   Home visit complete. I will see Rhonda [name] in one week.   Refills: Cymbalta Gabapentin 300 Clonazepam   Patient Care Team: Ardeen Garland, PA-C as PCP - General (Family Medicine) [name], LCSW as Social Worker (Licensed Clinical Social Worker) Alda Berthold, DO as Consulting Physician (Neurology)  Patient Active Problem List   Diagnosis Date Noted   Lumbago without sciatica 04/30/2021   Abnormal gait 06/11/2020   Adult failure to thrive syndrome 06/11/2020   Allergic rhinitis 06/11/2020   Anxiety 06/11/2020   Asthma 06/11/2020   Benign intracranial hypertension 06/11/2020   Body mass index (BMI) 45.0-49.9, adult ([place]) 06/11/2020   Bowel incontinence 06/11/2020   Cholelithiasis 06/11/2020   Chronic sinusitis 06/11/2020   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   Daytime somnolence 06/11/2020   Diarrhea of presumed infectious origin 06/11/2020   Edema 06/11/2020   Family history of malignant neoplasm of  gastrointestinal tract 06/11/2020   Gout 06/11/2020   History of fall 06/11/2020   History of infectious disease 06/11/2020   Insomnia 06/11/2020   Irregular bowel habits 06/11/2020   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   Lumbar spondylosis with myelopathy 06/11/2020   Macrocytosis 06/11/2020   Mild recurrent major depression ([place]) 06/11/2020   Personal history of colonic polyps 06/11/2020   Personal history of malignant neoplasm of breast 06/11/2020   Repeated falls 06/11/2020   Mixed anxiety and depressive disorder 06/11/2020   Irritable bowel syndrome 01/04/2020   Spinal stenosis of lumbar region 01/03/2020   Other symptoms and signs involving the musculoskeletal system 09/11/2019   Bilateral leg weakness 08/01/2019   Leg swelling 08/01/2019   Diabetes mellitus with neuropathy ([place]) 06/15/2019   Hypokalemia 11/08/2018   CKD (chronic kidney disease) stage 3, GFR 30-59 ml/min (HCC) 11/08/2018   Orthostatic hypotension 07/28/2017   SVD (spontaneous vaginal delivery)    Peripheral neuropathy    On home oxygen therapy    Migraines    Left bundle branch block    Hypothyroidism    Hypertension    Hyperlipidemia    Heart murmur    GERD (gastroesophageal reflux disease)    Exertional shortness of breath    Major depressive disorder    Back pain    Arthritis    Generalized anxiety disorder    Anemia    Chronic respiratory failure ([place]) 09/14/2013   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   Morbid obesity ([place]) 11/01/2012   Chronic  systolic heart failure ([place]) 10/27/2012   Endometrial polyp 01/20/2012   Malignant tumor of breast ([place]) 03/26/2011   Unspecified vitamin D deficiency 03/26/2011    Current Outpatient Medications:    acetaZOLAMIDE (DIAMOX) 125 MG tablet, Take 1 tablet (125 mg total) by mouth daily., Disp: 90 tablet, Rfl: 3   carvedilol (COREG) 3.125 MG tablet, TAKE 1 TABLET (3.125 MG) BY MOUTH TWICE DAILY WITH MEALS, Disp: 180  tablet, Rfl: 3   citalopram (CELEXA) 40 MG tablet, Take 40 mg by mouth daily., Disp: , Rfl:    clonazePAM (KLONOPIN) 0.5 MG tablet, Take 0.5 mg by mouth daily as needed for anxiety., Disp: , Rfl:    dicyclomine (BENTYL) 20 MG tablet, Take 20 mg by mouth 3 (three) times daily before meals., Disp: , Rfl:    DULoxetine (CYMBALTA) 60 MG capsule, Take 60 mg by mouth daily., Disp: , Rfl:    fexofenadine (ALLEGRA) 180 MG tablet, Take 180 mg by mouth 2 (two) times daily. (Patient not taking: Reported on 04/29/2021), Disp: , Rfl:    fludrocortisone (FLORINEF) 0.1 MG tablet, Take 1 tablet (100 mcg total) by mouth 2 (two) times daily. (Patient not taking: Reported on 07/01/2021), Disp: 180 tablet, Rfl: 3   fluticasone (FLONASE) 50 MCG/ACT nasal spray, , Disp: , Rfl:    gabapentin (NEURONTIN) 300 MG capsule, Take 1 capsule by mouth 3 (three) times daily., Disp: , Rfl:    gabapentin (NEURONTIN) 800 MG tablet, TAKE 1 TABLET AT BEDTIME AS NEEDED FOR NEUROPATHY, Disp: 30 tablet, Rfl: 1   ipratropium (ATROVENT) 0.03 % nasal spray, Place 2 sprays into both nostrils 2 (two) times daily., Disp: , Rfl:    levothyroxine (SYNTHROID) 88 MCG tablet, TAKE 1 TABLET(88 MCG) BY MOUTH DAILY BEFORE BREAKFAST, Disp: 90 tablet, Rfl: 3   metolazone (ZAROXOLYN) 2.5 MG tablet, Take 1 tablet by mouth every other Tuesday., Disp: 12 tablet, Rfl: 3   midodrine (PROAMATINE) 10 MG tablet, TAKE 1 TABLET THREE TIMES DAILY WITH MEALS, Disp: 270 tablet, Rfl: 3   mirtazapine (REMERON) 30 MG tablet, 1 tablet at bedtime., Disp: , Rfl:    montelukast (SINGULAIR) 10 MG tablet, Take 10 mg by mouth at bedtime. (Patient not taking: Reported on 05/13/2021), Disp: , Rfl: 1   Multiple Vitamin (MULTIVITAMIN) tablet, Take 1 tablet by mouth daily., Disp: , Rfl:    omeprazole (PRILOSEC) 40 MG capsule, Take 40 mg by mouth daily. (Patient not taking: Reported on 07/01/2021), Disp: , Rfl:    potassium chloride SA (KLOR-CON) 20 MEQ tablet, TAKE 2 TABLETS THREE TIMES  DAILY. TAKE AN EXTRA 2 TABLETS ON DAYS YOU TAKE METOLAZONE, Disp: 572 tablet, Rfl: 0   simvastatin (ZOCOR) 10 MG tablet, Take 1 tablet (10 mg total) by mouth every evening., Disp: 30 tablet, Rfl: 11   spironolactone (ALDACTONE) 25 MG tablet, Take 1 tablet (25 mg total) by mouth every evening. Needs appt for further refills, Disp: 90 tablet, Rfl: 0   torsemide (DEMADEX) 20 MG tablet, Take 1 tablet (20 mg total) by mouth 2 (two) times daily., Disp: 540 tablet, Rfl: 3 Allergies  Allergen Reactions   Ceftin Anaphylaxis    Face and throat swell    Geodon [Ziprasidone Hcl] Hives   Lisinopril Other (See Comments)    angioedema   Shellfish Allergy Other (See Comments)    Gout exacerbation   Shellfish-Derived Products     Other reaction(s): Other   Allopurinol Nausea Only and Other (See Comments)    weakness   Ativan [  Lorazepam] Itching   Sulfa Antibiotics Itching   Ultram [Tramadol Hcl] Itching   Ultram [Tramadol] Itching   Valium [Diazepam] Other (See Comments)    Patient states that diazepam doesn't relax, it has the opposite effect.     Social History   Socioeconomic History   Marital status: Married    Spouse name: Not on file   Number of children: 2   Years of education: 34   Highest education level: Master's degree (e.g., MA, MS, MEng, MEd, MSW, MBA)  Occupational History   Occupation: retired  Tobacco Use   Smoking status: Former    Packs/day: 0.10    Years: 26.00    Pack years: 2.60    Types: Cigarettes    Quit date: 03/23/2021    Years since quitting: 0.3   Smokeless tobacco: Never  Vaping Use   Vaping Use: Never used  Substance and Sexual Activity   Alcohol use: No   Drug use: No   Sexual activity: Yes  Other Topics Concern   Not on file  Social History Narrative   Tobacco Use Cigarettes: Former Smoker, Quit in 2008   No Alcohol   No recreational drug use   Diet: Regular/Low Carb   Exercise: None   Occupation: disabled   Education: Research officer, political party, masters    Children: 2   Firearms: No   Therapist, 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  Constitutional:      Appearance: Normal appearance. She is normal weight.  HENT:     Head: Normocephalic.     Nose: Nose normal.     Mouth/Throat:     Mouth: Mucous membranes are moist.     Pharynx: Oropharynx is clear.  Eyes:     Conjunctiva/sclera: Conjunctivae normal.     Pupils: Pupils are equal, round, and reactive to light.  Cardiovascular:     Rate and Rhythm: Normal rate and regular rhythm.     Pulses: Normal pulses.     Heart sounds: Normal heart sounds.  Pulmonary:     Effort: Pulmonary effort is normal.     Breath sounds: Normal breath sounds.  Abdominal:     General: Abdomen is flat.     Palpations: Abdomen is soft.  Musculoskeletal:        General: No swelling. Normal range of motion.     Cervical back: Normal range of motion.     Right lower leg: No edema.     Left lower leg: No edema.  Skin:    General: Skin is warm and dry.     Capillary Refill: Capillary refill takes less than 2 seconds.  Neurological:     General: No focal deficit present.     Mental Status: She is alert. Mental status is at baseline.  Psychiatric:        Mood and Affect: Mood normal.        Future Appointments  Date Time Provider [place]  07/24/2021 10:00 AM Hollace Kinnier L, DO ACP-ACP None  07/31/2021  1:00 PM Hazle Coca, PhD [facility] None  07/31/2021  2:00  PM LBN- [facility] TECH [facility] None  08/07/2021 10:00 AM Hazle Coca, PhD [facility] None  08/11/2021  7:40 AM [facility] DEVICE REMOTES [facility]  09/10/2021  7:40 AM [facility]  DEVICE REMOTES [facility]  12/25/2021  2:30 PM [facility] PA/NP [facility] None  03/04/2022  8:50 AM Patel, Arvin Collard K, DO [facility] None     ACTION: Home visit completed

## 2021-07-18 NOTE — Progress Notes (Signed)
No ICM remote transmission received for 07/14/2021 and next ICM transmission scheduled for 07/28/2021.

## 2021-07-22 ENCOUNTER — Other Ambulatory Visit (HOSPITAL_COMMUNITY): Payer: Self-pay

## 2021-07-22 NOTE — Progress Notes (Signed)
Paramedicine Encounter    Patient ID: Rhonda [name], female    DOB: 1950-06-21, 71 y.o.   MRN: [id number]  Arrived for home visit for Rhonda [name] who reports feeling okay but having a sore throat with some sinus congestion which is not abnormal for her. She has been off of her allergy medicine [facility] [place]) for several months despite me trying to get her and her husband to pick it up. She reports she will pick some up OTC this week.   Rhonda [name] has been wanting to move PCP's to a [facility] doctor. I assisted her today in getting an appointment with [facility]. She will be seen on Friday at 2:20.   I obtained vitals and assessment. No swelling more than normal noted. Lungs clear on assessment. Weight down 11 lbs this week. She reports not eating high sodium foods/ fast foods and not over drinking fluids.   I reviewed meds and confirmed list filling pill box for one week.   We reviewed other appointments and confirmed same.   Rhonda [name] reports still feeling depressed. I am actively seeking a therapist in network for her. I will continue to assist.   Refills called into [facility]: Bentyl Gabapentin Clonazepam   Patient Care Team: Ardeen Garland, PA-C as PCP - General (Family Medicine) [name], LCSW as Social Worker (Licensed Clinical Social Worker) Alda Berthold, DO as Consulting Physician (Neurology)  Patient Active Problem List   Diagnosis Date Noted   Lumbago without sciatica 04/30/2021   Abnormal gait 06/11/2020   Adult failure to thrive syndrome 06/11/2020   Allergic rhinitis 06/11/2020   Anxiety 06/11/2020   Asthma 06/11/2020   Benign intracranial hypertension 06/11/2020   Body mass index (BMI) 45.0-49.9, adult ([place]) 06/11/2020   Bowel incontinence 06/11/2020   Cholelithiasis 06/11/2020   Chronic sinusitis 06/11/2020   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   Daytime somnolence 06/11/2020   Diarrhea of presumed infectious origin 06/11/2020   Edema 06/11/2020    Family history of malignant neoplasm of gastrointestinal tract 06/11/2020   Gout 06/11/2020   History of fall 06/11/2020   History of infectious disease 06/11/2020   Insomnia 06/11/2020   Irregular bowel habits 06/11/2020   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   Lumbar spondylosis with myelopathy 06/11/2020   Macrocytosis 06/11/2020   Mild recurrent major depression ([place]) 06/11/2020   Personal history of colonic polyps 06/11/2020   Personal history of malignant neoplasm of breast 06/11/2020   Repeated falls 06/11/2020   Mixed anxiety and depressive disorder 06/11/2020   Irritable bowel syndrome 01/04/2020   Spinal stenosis of lumbar region 01/03/2020   Other symptoms and signs involving the musculoskeletal system 09/11/2019   Bilateral leg weakness 08/01/2019   Leg swelling 08/01/2019   Diabetes mellitus with neuropathy ([place]) 06/15/2019   Hypokalemia 11/08/2018   CKD (chronic kidney disease) stage 3, GFR 30-59 ml/min (HCC) 11/08/2018   Orthostatic hypotension 07/28/2017   SVD (spontaneous vaginal delivery)    Peripheral neuropathy    On home oxygen therapy    Migraines    Left bundle branch block    Hypothyroidism    Hypertension    Hyperlipidemia    Heart murmur    GERD (gastroesophageal reflux disease)    Exertional shortness of breath    Major depressive disorder    Back pain    Arthritis    Generalized anxiety disorder    Anemia    Chronic respiratory failure ([place]) 09/14/2013   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  Morbid obesity ([place]) 92/03/9416   Chronic systolic heart failure ([place]) 10/27/2012   Endometrial polyp 01/20/2012   Malignant tumor of breast ([place]) 03/26/2011   Unspecified vitamin D deficiency 03/26/2011    Current Outpatient Medications:    acetaZOLAMIDE (DIAMOX) 125 MG tablet, Take 1 tablet (125 mg total) by mouth daily., Disp: 90 tablet, Rfl: 3   carvedilol (COREG) 3.125 MG tablet, TAKE 1 TABLET (3.125 MG) BY  MOUTH TWICE DAILY WITH MEALS, Disp: 180 tablet, Rfl: 3   citalopram (CELEXA) 40 MG tablet, Take 40 mg by mouth daily., Disp: , Rfl:    clonazePAM (KLONOPIN) 0.5 MG tablet, Take 0.5 mg by mouth daily as needed for anxiety., Disp: , Rfl:    dicyclomine (BENTYL) 20 MG tablet, Take 20 mg by mouth 3 (three) times daily before meals., Disp: , Rfl:    DULoxetine (CYMBALTA) 60 MG capsule, Take 60 mg by mouth daily., Disp: , Rfl:    fexofenadine (ALLEGRA) 180 MG tablet, Take 180 mg by mouth 2 (two) times daily. (Patient not taking: Reported on 04/29/2021), Disp: , Rfl:    fludrocortisone (FLORINEF) 0.1 MG tablet, Take 1 tablet (100 mcg total) by mouth 2 (two) times daily. (Patient not taking: Reported on 07/01/2021), Disp: 180 tablet, Rfl: 3   fluticasone (FLONASE) 50 MCG/ACT nasal spray, , Disp: , Rfl:    gabapentin (NEURONTIN) 300 MG capsule, Take 1 capsule by mouth 3 (three) times daily., Disp: , Rfl:    gabapentin (NEURONTIN) 800 MG tablet, TAKE 1 TABLET AT BEDTIME AS NEEDED FOR NEUROPATHY, Disp: 30 tablet, Rfl: 1   ipratropium (ATROVENT) 0.03 % nasal spray, Place 2 sprays into both nostrils 2 (two) times daily., Disp: , Rfl:    levothyroxine (SYNTHROID) 88 MCG tablet, TAKE 1 TABLET(88 MCG) BY MOUTH DAILY BEFORE BREAKFAST, Disp: 90 tablet, Rfl: 3   metolazone (ZAROXOLYN) 2.5 MG tablet, Take 1 tablet by mouth every other Tuesday., Disp: 12 tablet, Rfl: 3   midodrine (PROAMATINE) 10 MG tablet, TAKE 1 TABLET THREE TIMES DAILY WITH MEALS, Disp: 270 tablet, Rfl: 3   mirtazapine (REMERON) 30 MG tablet, 1 tablet at bedtime., Disp: , Rfl:    montelukast (SINGULAIR) 10 MG tablet, Take 10 mg by mouth at bedtime. (Patient not taking: Reported on 05/13/2021), Disp: , Rfl: 1   Multiple Vitamin (MULTIVITAMIN) tablet, Take 1 tablet by mouth daily., Disp: , Rfl:    omeprazole (PRILOSEC) 40 MG capsule, Take 40 mg by mouth daily. (Patient not taking: Reported on 07/01/2021), Disp: , Rfl:    potassium chloride SA (KLOR-CON) 20  MEQ tablet, TAKE 2 TABLETS THREE TIMES DAILY. TAKE AN EXTRA 2 TABLETS ON DAYS YOU TAKE METOLAZONE, Disp: 572 tablet, Rfl: 0   simvastatin (ZOCOR) 10 MG tablet, Take 1 tablet (10 mg total) by mouth every evening., Disp: 30 tablet, Rfl: 11   spironolactone (ALDACTONE) 25 MG tablet, Take 1 tablet (25 mg total) by mouth every evening. Needs appt for further refills, Disp: 90 tablet, Rfl: 0   torsemide (DEMADEX) 20 MG tablet, Take 1 tablet (20 mg total) by mouth 2 (two) times daily., Disp: 540 tablet, Rfl: 3 Allergies  Allergen Reactions   Ceftin Anaphylaxis    Face and throat swell    Geodon [Ziprasidone Hcl] Hives   Lisinopril Other (See Comments)    angioedema   Shellfish Allergy Other (See Comments)    Gout exacerbation   Shellfish-Derived Products     Other reaction(s): Other   Allopurinol Nausea Only and Other (See Comments)  weakness   Ativan [Lorazepam] Itching   Sulfa Antibiotics Itching   Ultram [Tramadol Hcl] Itching   Ultram [Tramadol] Itching   Valium [Diazepam] Other (See Comments)    Patient states that diazepam doesn't relax, it has the opposite effect.     Social History   Socioeconomic History   Marital status: Married    Spouse name: Not on file   Number of children: 2   Years of education: 90   Highest education level: Master's degree (e.g., MA, MS, MEng, MEd, MSW, MBA)  Occupational History   Occupation: retired  Tobacco Use   Smoking status: Former    Packs/day: 0.10    Years: 26.00    Pack years: 2.60    Types: Cigarettes    Quit date: 03/23/2021    Years since quitting: 0.3   Smokeless tobacco: Never  Vaping Use   Vaping Use: Never used  Substance and Sexual Activity   Alcohol use: No   Drug use: No   Sexual activity: Yes  Other Topics Concern   Not on file  Social History Narrative   Tobacco Use Cigarettes: Former Smoker, Quit in 2008   No Alcohol   No recreational drug use   Diet: Regular/Low Carb   Exercise: None   Occupation:  disabled   Education: Research officer, political party, masters   Children: 2   Firearms: No   Therapist, 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  Constitutional:      Appearance: Normal appearance. She is normal weight.  HENT:     Head: Normocephalic.     Nose: Nose normal.     Mouth/Throat:     Mouth: Mucous membranes are moist.     Pharynx: Oropharynx is clear.  Eyes:     Conjunctiva/sclera: Conjunctivae normal.     Pupils: Pupils are equal, round, and reactive to light.  Cardiovascular:     Rate and Rhythm: Normal rate and regular rhythm.     Pulses: Normal pulses.     Heart sounds: Normal heart sounds.  Pulmonary:     Effort: Pulmonary effort is normal.     Breath sounds: Normal breath sounds.  Abdominal:     General: Abdomen is flat.     Palpations: Abdomen is soft.  Musculoskeletal:        General: No swelling. Normal range of motion.     Cervical back: Normal range of motion.     Right lower leg: No edema.     Left lower leg: No edema.  Skin:    General: Skin is warm and dry.     Capillary Refill: Capillary refill takes less than 2 seconds.  Neurological:     General: No focal deficit present.     Mental Status: She is alert. Mental status is at baseline.  Psychiatric:        Mood and Affect: Mood normal.        Future Appointments  Date Time Provider [place]  07/24/2021 10:00 AM Reed, Tiffany L, DO [facility] None  07/28/2021  7:10 AM [facility] DEVICE REMOTES [facility]  07/31/2021  1:00 PM Hazle Coca, PhD [facility] None  07/31/2021  2:00 PM [facility] [place] [facility] None  08/07/2021 10:00 AM Hazle Coca, PhD  [facility] None  08/11/2021  7:40 AM [facility] DEVICE REMOTES [facility]  09/10/2021  7:40 AM [facility] DEVICE REMOTES [facility]  12/25/2021  2:30 PM [facility] PA/NP [facility] None  03/04/2022  8:50 AM Patel, Arvin Collard K, DO [facility] None     ACTION: Home visit completed

## 2021-07-23 ENCOUNTER — Telehealth (HOSPITAL_COMMUNITY): Payer: Self-pay | Admitting: Licensed Clinical Social Worker

## 2021-07-23 DIAGNOSIS — R0981 Nasal congestion: Secondary | ICD-10-CM | POA: Diagnosis not present

## 2021-07-23 DIAGNOSIS — R0982 Postnasal drip: Secondary | ICD-10-CM | POA: Diagnosis not present

## 2021-07-23 DIAGNOSIS — Q359 Cleft palate, unspecified: Secondary | ICD-10-CM | POA: Diagnosis not present

## 2021-07-23 DIAGNOSIS — I5032 Chronic diastolic (congestive) heart failure: Secondary | ICD-10-CM

## 2021-07-23 NOTE — Telephone Encounter (Signed)
HF Paramedicine Team Based Care Meeting ? ?HF MD- NA  ?HF NP - [name] NP-C  ? ?[facility] HF Paramedicine  ?[name] ?Jeris Penta  ?Tammy Sours ? ?Hospital admit within the last 30 days for heart failure? no ? ?Medications concerns? Can't do own medications ? ?Transportation issues ? no ? ?Education needs? no ? ?SDOH -depression- order placed for counselor ? ?Eligible for discharge? Working to get stable PCP ? ?[name], LCSW ?Clinical Social Worker ?[facility] ?Desk#: [phone] ?Cell#: [phone] ? ?

## 2021-07-24 ENCOUNTER — Other Ambulatory Visit: Payer: Medicare HMO | Admitting: Internal Medicine

## 2021-07-24 ENCOUNTER — Other Ambulatory Visit: Payer: Self-pay

## 2021-07-24 DIAGNOSIS — G932 Benign intracranial hypertension: Secondary | ICD-10-CM

## 2021-07-24 DIAGNOSIS — I951 Orthostatic hypotension: Secondary | ICD-10-CM

## 2021-07-24 DIAGNOSIS — Z515 Encounter for palliative care: Secondary | ICD-10-CM

## 2021-07-24 DIAGNOSIS — F339 Major depressive disorder, recurrent, unspecified: Secondary | ICD-10-CM

## 2021-07-24 DIAGNOSIS — M48062 Spinal stenosis, lumbar region with neurogenic claudication: Secondary | ICD-10-CM

## 2021-07-24 DIAGNOSIS — I5020 Unspecified systolic (congestive) heart failure: Secondary | ICD-10-CM

## 2021-07-24 DIAGNOSIS — R419 Unspecified symptoms and signs involving cognitive functions and awareness: Secondary | ICD-10-CM

## 2021-07-25 ENCOUNTER — Ambulatory Visit: Payer: Medicare HMO | Admitting: Nurse Practitioner

## 2021-07-25 ENCOUNTER — Telehealth: Payer: Self-pay

## 2021-07-25 NOTE — Progress Notes (Signed)
[facility] Follow-Up Visit Telephone: [phone]  Fax: [phone]   Date of encounter: 07/25/21 5:52 PM PATIENT NAME: Rhonda [name] [address] [place] [address]   [phone] (home)  DOB: Apr 13, 1951 MRN: [id number] PRIMARY CARE PROVIDER:    Damaris Schooner,  [address] [place] [address] [phone]  REFERRING PROVIDER:   Damaris Schooner [address],  [place] [address] [phone]  RESPONSIBLE PARTY:    Contact Information     Name Relation Home Work Mobile   Cerda,Edward Spouse [phone]   Gwendalyn Ege Daughter   [phone]        I met face to face with patient and family in her home. Palliative Care was asked to follow this patient by consultation request of  [facility]* to address advance care planning and complex medical decision making. This is follow-up visit.                                     ASSESSMENT AND PLAN / RECOMMENDATIONS:   Advance Care Planning/Goals of Care: Goals include to maximize quality of life and symptom management. She would like to be able to get out and socialize more, [facility], keep her home clean, not be so depressed CODE STATUS:  full code  Symptom Management/Plan: 1. Depression, recurrent ([place]) -has multiple psych meds, but remains depressed--celexa, cymbalta, remeron and clonazepam--these also interact and remeron causes weight gain which she does not need in view of her CHF -recommended she resume psychiatric visits and some counseling whether in-person, virtual, something to help with her sadness, lack of motivation, these continue to interfere with her cognition (as noted in her neuropsych testing)  2. Neurocognitive disorder -was felt to be induced by her depression and does not seem much different from the last time I saw her -needs psych f/u  3.  Benign intracranial hypertension -cont diamox  4. HFrEF (heart failure with reduced ejection fraction) (HCC) -cont torsemide, spironolactone, metolazone all per cardiology -encouraged [facility] -cont pillboxes through paramedicine  5. Morbid obesity ([place]) -ongoing issue, recommend weaning off remeron due to this especially with 2 other antidepressants and an anxiolytic  6. Spinal stenosis of lumbar region with neurogenic claudication -continues on high doses of gabapentin for this; may also contribute to dizziness, monitor carefully with her CKD3b; pt needs stable PCP to monitor her multiple comorbidities and interacting conditions and meds  7. Orthostatic hypotension -continues to struggle with dizziness on standing despite florinef (? Due to #3), stands up slowly and then does ok  8. Palliative care by specialist -last time I saw her, I asked her to review MOST so we could complete it; however, she canceled that appt last minute in the fall/winter and was just seen again in March; will plan to readdress ACP again next time, but this is particularly challenging when her depression remains an overarching issue    Follow up Palliative Care Visit: Palliative care will continue to follow for complex medical decision making, advance care planning, and clarification of goals. Return 4-12 weeks or prn.   This visit was coded based on medical decision making (MDM).  PPS: 50%  HOSPICE ELIGIBILITY/DIAGNOSIS: TBD  Chief Complaint: Follow-up palliative visit  HISTORY OF PRESENT ILLNESS:  Rhonda [name] is  a 71 y.o. year old female  with depression, neurocognitive deficits, polypharmacy, HFrEF, CKD3b, morbid obesity, htn, orthostatic hypotension, yeast infections, chronic venous insufficiency, severe L4-5 lumbar spinal stenosis, intracranial htn, and neuropathy among others seen for palliative care consult.  Pt currently seen alone as husband works at [employer]  and resting during day.  History obtained from review of EMR, discussion with primary team, and interview with family, facility staff/caregiver and/or Ms. Birnbaum.   I reviewed available labs, medications, imaging, studies and related documents from the EMR.  Records reviewed and summarized above.   ROS  General: NAD EYES: denies vision changes, glasses ENMT: denies dysphagia, cleft palate, sore throat and congestion (on abx) Cardiovascular: denies chest pain, denies DOE Pulmonary: denies cough, denies increased SOB Abdomen: endorses good appetite, denies constipation, endorses continence of bowel GU: denies dysuria, endorses continence of urine MSK:  denies increased weakness,  no falls reported Skin: denies rashes or wounds Neurological: has back pain, denies insomnia Psych: Endorses depression Heme/lymph/immuno: denies bruises, abnormal bleeding  Physical Exam: Current and past weights:  222 lbs on 2/28 Constitutional: NAD General: obese  EYES: anicteric sclera, lids intact, no discharge  ENMT: intact hearing, oral mucous membranes moist, dentition intact,does not have her insert in for her cleft palate so speech very difficult to understand CV: S1S2, RRR, nonpitting LE edema Pulmonary: LCTA, no increased work of breathing, no cough, room air Abdomen: intake 100%, normo-active BS + 4 quadrants, soft and non tender, no ascites GU: deferred MSK: no sarcopenia, moves all extremities, ambulatory Skin: warm and dry, no rashes or wounds on visible skin Neuro:  no generalized weakness,  has cognitive impairment but did not repeat questions or stories today Psych: non-anxious affect, A and O x 3 Hem/lymph/immuno: no widespread bruising  CURRENT PROBLEM LIST:  Patient Active Problem List   Diagnosis Date Noted   Lumbago without sciatica 04/30/2021   Abnormal gait 06/11/2020   Adult failure to thrive syndrome 06/11/2020   Allergic rhinitis 06/11/2020   Anxiety 06/11/2020   Asthma  06/11/2020   Benign intracranial hypertension 06/11/2020   Body mass index (BMI) 45.0-49.9, adult ([place]) 06/11/2020   Bowel incontinence 06/11/2020   Cholelithiasis 06/11/2020   Chronic sinusitis 06/11/2020   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   Daytime somnolence 06/11/2020   Diarrhea of presumed infectious origin 06/11/2020   Edema 06/11/2020   Family history of malignant neoplasm of gastrointestinal tract 06/11/2020   Gout 06/11/2020   History of fall 06/11/2020   History of infectious disease 06/11/2020   Insomnia 06/11/2020   Irregular bowel habits 06/11/2020   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   Lumbar spondylosis with myelopathy 06/11/2020   Macrocytosis 06/11/2020   Mild recurrent major depression ([place]) 06/11/2020   Personal history of colonic polyps 06/11/2020   Personal history of malignant neoplasm of breast 06/11/2020   Repeated falls 06/11/2020   Mixed anxiety and depressive disorder 06/11/2020   Irritable bowel syndrome 01/04/2020   Spinal stenosis of lumbar region 01/03/2020   Other symptoms and signs involving the musculoskeletal system 09/11/2019   Bilateral leg weakness 08/01/2019   Leg swelling 08/01/2019   Diabetes mellitus with neuropathy ([place]) 06/15/2019   Hypokalemia 11/08/2018   CKD (chronic kidney disease) stage 3, GFR 30-59 ml/min (HCC) 11/08/2018   Orthostatic hypotension 07/28/2017   SVD (spontaneous vaginal delivery)    Peripheral neuropathy    On home oxygen therapy    Migraines    Left bundle branch block    Hypothyroidism    Hypertension  Hyperlipidemia    Heart murmur    GERD (gastroesophageal reflux disease)    Exertional shortness of breath    Major depressive disorder    Back pain    Arthritis    Generalized anxiety disorder    Anemia    Chronic respiratory failure ([place]) 09/14/2013   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   Morbid obesity ([place]) 78/29/5621   Chronic systolic heart failure ([place]) 10/27/2012    Endometrial polyp 01/20/2012   Malignant tumor of breast ([place]) 03/26/2011   Unspecified vitamin D deficiency 03/26/2011   PAST MEDICAL HISTORY:  Active Ambulatory Problems    Diagnosis Date Noted   Malignant tumor of breast ([place]) 03/26/2011   Unspecified vitamin D deficiency 03/26/2011   Endometrial polyp 30/86/5784   Chronic systolic heart failure ([place]) 10/27/2012   Morbid obesity ([place]) 11/01/2012   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   Chronic respiratory failure ([place]) 09/14/2013   SVD (spontaneous vaginal delivery)    Peripheral neuropathy    On home oxygen therapy    Migraines    Left bundle branch block    Hypothyroidism    Hypertension    Hyperlipidemia    Heart murmur    GERD (gastroesophageal reflux disease)    Exertional shortness of breath    Major depressive disorder    Back pain    Arthritis    Generalized anxiety disorder    Anemia    Orthostatic hypotension 07/28/2017   Hypokalemia 11/08/2018   CKD (chronic kidney disease) stage 3, GFR 30-59 ml/min (HCC) 11/08/2018   Diabetes mellitus with neuropathy ([place]) 06/15/2019   Abnormal gait 06/11/2020   Adult failure to thrive syndrome 06/11/2020   Allergic rhinitis 06/11/2020   Anxiety 06/11/2020   Asthma 06/11/2020   Benign intracranial hypertension 06/11/2020   Body mass index (BMI) 45.0-49.9, adult ([place]) 06/11/2020   Bowel incontinence 06/11/2020   Cholelithiasis 06/11/2020   Chronic sinusitis 06/11/2020   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   Daytime somnolence 06/11/2020   Diarrhea of presumed infectious origin 06/11/2020   Edema 06/11/2020   Family history of malignant neoplasm of gastrointestinal tract 06/11/2020   Gout 06/11/2020   History of fall 06/11/2020   History of infectious disease 06/11/2020   Insomnia 06/11/2020   Irregular bowel habits 06/11/2020   Irritable bowel syndrome 01/04/2020   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   Spinal stenosis of lumbar region  01/03/2020   Lumbar spondylosis with myelopathy 06/11/2020   Macrocytosis 06/11/2020   Mild recurrent major depression ([place]) 06/11/2020   Personal history of colonic polyps 06/11/2020   Personal history of malignant neoplasm of breast 06/11/2020   Repeated falls 06/11/2020   Mixed anxiety and depressive disorder 06/11/2020   Other symptoms and signs involving the musculoskeletal system 09/11/2019   Bilateral leg weakness 08/01/2019   Leg swelling 08/01/2019   Lumbago without sciatica 04/30/2021   Resolved Ambulatory Problems    Diagnosis Date Noted   S/P right TKA 69/62/9528   Complication of anesthesia    Pain, dental 08/31/2018   Past Medical History:  Diagnosis Date   Asthma    Breast cancer, stage 1 ([place]) 03/26/2011   Cardiomyopathy    Diabetes mellitus    Gout    SOCIAL HX:  Social History   Tobacco Use   Smoking status: Former    Packs/day: 0.10    Years: 26.00    Pack years: 2.60    Types: Cigarettes    Quit date: 03/23/2021    Years  since quitting: 0.3   Smokeless tobacco: Never  Substance Use Topics   Alcohol use: No     ALLERGIES:  Allergies  Allergen Reactions   Ceftin Anaphylaxis    Face and throat swell    Geodon [Ziprasidone Hcl] Hives   Lisinopril Other (See Comments)    angioedema   Shellfish Allergy Other (See Comments)    Gout exacerbation   Shellfish-Derived Products     Other reaction(s): Other   Allopurinol Nausea Only and Other (See Comments)    weakness   Ativan [Lorazepam] Itching   Sulfa Antibiotics Itching   Ultram [Tramadol Hcl] Itching   Ultram [Tramadol] Itching   Valium [Diazepam] Other (See Comments)    Patient states that diazepam doesn't relax, it has the opposite effect.     PERTINENT MEDICATIONS:  Outpatient Encounter Medications as of 07/24/2021  Medication Sig   acetaZOLAMIDE (DIAMOX) 125 MG tablet Take 1 tablet (125 mg total) by mouth daily.   carvedilol (COREG) 3.125 MG tablet TAKE 1 TABLET (3.125 MG) BY MOUTH  TWICE DAILY WITH MEALS   citalopram (CELEXA) 40 MG tablet Take 40 mg by mouth daily.   clonazePAM (KLONOPIN) 0.5 MG tablet Take 0.5 mg by mouth daily as needed for anxiety.   dicyclomine (BENTYL) 20 MG tablet Take 20 mg by mouth 3 (three) times daily before meals.   DULoxetine (CYMBALTA) 60 MG capsule Take 60 mg by mouth daily.   fexofenadine (ALLEGRA) 180 MG tablet Take 180 mg by mouth 2 (two) times daily.   fludrocortisone (FLORINEF) 0.1 MG tablet Take 1 tablet (100 mcg total) by mouth 2 (two) times daily. (Patient not taking: Reported on 07/01/2021)   fluticasone (FLONASE) 50 MCG/ACT nasal spray    gabapentin (NEURONTIN) 300 MG capsule Take 1 capsule by mouth 3 (three) times daily.   gabapentin (NEURONTIN) 800 MG tablet TAKE 1 TABLET AT BEDTIME AS NEEDED FOR NEUROPATHY   ipratropium (ATROVENT) 0.03 % nasal spray Place 2 sprays into both nostrils 2 (two) times daily.   levothyroxine (SYNTHROID) 88 MCG tablet TAKE 1 TABLET(88 MCG) BY MOUTH DAILY BEFORE BREAKFAST   metolazone (ZAROXOLYN) 2.5 MG tablet Take 1 tablet by mouth every other Tuesday.   midodrine (PROAMATINE) 10 MG tablet TAKE 1 TABLET THREE TIMES DAILY WITH MEALS   mirtazapine (REMERON) 30 MG tablet 1 tablet at bedtime.   montelukast (SINGULAIR) 10 MG tablet Take 10 mg by mouth at bedtime. (Patient not taking: Reported on 05/13/2021)   Multiple Vitamin (MULTIVITAMIN) tablet Take 1 tablet by mouth daily.   omeprazole (PRILOSEC) 40 MG capsule Take 40 mg by mouth daily. (Patient not taking: Reported on 07/01/2021)   potassium chloride SA (KLOR-CON) 20 MEQ tablet TAKE 2 TABLETS THREE TIMES DAILY. TAKE AN EXTRA 2 TABLETS ON DAYS YOU TAKE METOLAZONE   simvastatin (ZOCOR) 10 MG tablet Take 1 tablet (10 mg total) by mouth every evening.   spironolactone (ALDACTONE) 25 MG tablet Take 1 tablet (25 mg total) by mouth every evening. Needs appt for further refills   torsemide (DEMADEX) 20 MG tablet Take 1 tablet (20 mg total) by mouth 2 (two) times  daily.   No facility-administered encounter medications on file as of 07/24/2021.   Thank you for the opportunity to participate in the care of Ms. Mey.  The palliative care team will continue to follow. Please call our office at [phone] if we can be of additional assistance.   Hollace Kinnier, DO  COVID-19 PATIENT SCREENING TOOL Asked and negative response unless  otherwise noted:  Have you had symptoms of covid, tested positive or been in contact with someone with symptoms/positive test in the past 5-10 days? no

## 2021-07-25 NOTE — Telephone Encounter (Signed)
Received message yesterday from [facility] staff,  pt requested CB to discuss retrying PREP ?Call to pt.  ?Confirmed wanting to retry PREP (previously stopped due to illness) ?Can do start of class 08/18/21 230p-345p M/W ?Will call her closer to start of class to do intake and measurements.  ?

## 2021-07-27 ENCOUNTER — Encounter: Payer: Self-pay | Admitting: Internal Medicine

## 2021-07-28 ENCOUNTER — Ambulatory Visit (INDEPENDENT_AMBULATORY_CARE_PROVIDER_SITE_OTHER): Payer: Medicare HMO

## 2021-07-28 DIAGNOSIS — Z95810 Presence of automatic (implantable) cardiac defibrillator: Secondary | ICD-10-CM

## 2021-07-28 DIAGNOSIS — I5032 Chronic diastolic (congestive) heart failure: Secondary | ICD-10-CM

## 2021-07-29 ENCOUNTER — Other Ambulatory Visit (HOSPITAL_COMMUNITY): Payer: Self-pay

## 2021-07-29 NOTE — Progress Notes (Signed)
Paramedicine Encounter ? ?OPENED ENCOUNTER IN ERROR.  ? ?ACTION: ?Next visit planned for 3/8 ? ? ? ? ? ? ?

## 2021-07-30 ENCOUNTER — Other Ambulatory Visit (HOSPITAL_COMMUNITY): Payer: Self-pay

## 2021-07-30 NOTE — Progress Notes (Signed)
EPIC Encounter for ICM Monitoring ? ?Patient Name: Rhonda [name] is a 71 y.o. female ?Date: 07/30/2021 ?Primary Care Physican: Ardeen Garland, PA-C ?Primary Cardiologist: [place] ?Electrophysiologist: Lovena Le ?Bi-V Pacing: 98.7%   ?07/30/2021  Weight: 230 lbs  ?  ?  ?Spoke with patient and heart failure questions reviewed.  Pt asymptomatic for fluid accumulation.  Reporting she is drinking mostly soups and liquids that equals more than 64 oz fluid daily.  ?  ?Optivol thoracic impedance suggesting possible fluid accumulation but trending back to close to baseline.  ?  ?Prescribed:  Patient participating in paramedicine program to assist with meds. ?Torsemide 20 mg 4 tablets (80 mg total) by mouth every morning and 3 tablets (60 mg total) every evening.   ?Potassium  20 meq 2 tablets (40 mEq total) by mouth 3 (three) times daily.  Take extra 2 tablets on days you take Metolazone. ?Metolazone 2.'5mg'$  1 tablet every Tuesday. ?Spironolactone 25 mg take 1 tablet by mouth every evening. ?  ?Labs: ?05/01/2021 Creatinine 1.81, BUN 45, Potassium 3.7, Sodium 140, GFR 30 ?04/23/2021 Creatinine 1.71, BUN 33, Potassium 2.9, Sodium 137, GFR 32 ?0/26/2022 Creatinine 1.58, BUN 29, Potassium 3.9, Sodium 138, GFR 35, BNP 68.6 ?A complete set of results can be found in Results Review. ?  ?Recommendations:  Recommended to limit fluid intake to 64 oz fluid daily and include soups, ice cream, ice, soda, water, tea and coffee.   Message sent to paramedicine EMT [name] to make aware of fluid accumulation.  ?  ?Follow-up plan: ICM clinic phone appointment on 08/11/2021 to recheck fluid levels.   91 day device clinic remote transmission 08/11/2021.   ?  ?EP/Cardiology Office Visits:    Recall 07/26/2021 with Dr Lovena Le.   ?  ?Copy of ICM check sent to Dr. Lovena Le.      ? ?3 month ICM trend: 07/28/2021. ? ? ? ?12-14 Month ICM trend:  ? ? ? ?[name], RN ?07/30/2021 ?12:52 PM ? ?

## 2021-07-30 NOTE — Progress Notes (Signed)
Paramedicine Encounter    Patient ID: Rhonda [name], female    DOB: 1950-07-09, 71 y.o.   MRN: [id number]   Arrived for home visit for Rhonda [name] who reports feeling depressed and tired. She states that she has not been taking her medications over the last week because she is so depressed. She denied thoughts of self harm but is very tired and states she just did not want to take her medicines from being so sad.   I spoke to Rhonda [name] and ensured her I reached out to Rhonda [name] about getting her a therapist. I will follow up with that referral.   I obtained vitals and assessment. Some lower leg left ankle edema noted. Lungs clear.   I reviewed medications and filled two weeks of medications as I will be out of town next week. [name] and I rescheduled her primary care appointment for 3/15 at 2:00. She is agreeable and I wrote this down on her calendar.   Home visit complete. I will see [name] in two weeks. She is aware if her depression gets to bad to reach out to Rhonda [name] or to go to [facility].   Refills- [facility]: Torsemide   [facility]: Clonazepam, Celexa     Patient Care Team: Ardeen Garland, PA-C as PCP - General (Family Medicine) [name], [name] as Social Worker (Licensed Clinical Social Worker) Alda Berthold, DO as Consulting Physician (Neurology)  Patient Active Problem List   Diagnosis Date Noted   Lumbago without sciatica 04/30/2021   Abnormal gait 06/11/2020   Adult failure to thrive syndrome 06/11/2020   Allergic rhinitis 06/11/2020   Anxiety 06/11/2020   Asthma 06/11/2020   Benign intracranial hypertension 06/11/2020   Body mass index (BMI) 45.0-49.9, adult ([place]) 06/11/2020   Bowel incontinence 06/11/2020   Cholelithiasis 06/11/2020   Chronic sinusitis 06/11/2020   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   Daytime somnolence 06/11/2020   Diarrhea of presumed infectious origin 06/11/2020   Edema 06/11/2020   Family history of malignant  neoplasm of gastrointestinal tract 06/11/2020   Gout 06/11/2020   History of fall 06/11/2020   History of infectious disease 06/11/2020   Insomnia 06/11/2020   Irregular bowel habits 06/11/2020   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   Lumbar spondylosis with myelopathy 06/11/2020   Macrocytosis 06/11/2020   Mild recurrent major depression ([place]) 06/11/2020   Personal history of colonic polyps 06/11/2020   Personal history of malignant neoplasm of breast 06/11/2020   Repeated falls 06/11/2020   Mixed anxiety and depressive disorder 06/11/2020   Irritable bowel syndrome 01/04/2020   Spinal stenosis of lumbar region 01/03/2020   Other symptoms and signs involving the musculoskeletal system 09/11/2019   Bilateral leg weakness 08/01/2019   Leg swelling 08/01/2019   Diabetes mellitus with neuropathy ([place]) 06/15/2019   Hypokalemia 11/08/2018   CKD (chronic kidney disease) stage 3, GFR 30-59 ml/min (HCC) 11/08/2018   Orthostatic hypotension 07/28/2017   SVD (spontaneous vaginal delivery)    Peripheral neuropathy    On home oxygen therapy    Migraines    Left bundle branch block    Hypothyroidism    Hypertension    Hyperlipidemia    Heart murmur    GERD (gastroesophageal reflux disease)    Exertional shortness of breath    Major depressive disorder    Back pain    Arthritis    Generalized anxiety disorder    Anemia    Chronic respiratory failure ([place]) 09/14/2013  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   Morbid obesity ([place]) 56/38/7564   Chronic systolic heart failure ([place]) 10/27/2012   Endometrial polyp 01/20/2012   Malignant tumor of breast ([place]) 03/26/2011   Unspecified vitamin D deficiency 03/26/2011    Current Outpatient Medications:    acetaZOLAMIDE (DIAMOX) 125 MG tablet, Take 1 tablet (125 mg total) by mouth daily., Disp: 90 tablet, Rfl: 3   carvedilol (COREG) 3.125 MG tablet, TAKE 1 TABLET (3.125 MG) BY MOUTH TWICE DAILY WITH MEALS,  Disp: 180 tablet, Rfl: 3   citalopram (CELEXA) 40 MG tablet, Take 40 mg by mouth daily., Disp: , Rfl:    clonazePAM (KLONOPIN) 0.5 MG tablet, Take 0.5 mg by mouth daily as needed for anxiety., Disp: , Rfl:    dicyclomine (BENTYL) 20 MG tablet, Take 20 mg by mouth 3 (three) times daily before meals., Disp: , Rfl:    DULoxetine (CYMBALTA) 60 MG capsule, Take 60 mg by mouth daily., Disp: , Rfl:    fexofenadine (ALLEGRA) 180 MG tablet, Take 180 mg by mouth 2 (two) times daily., Disp: , Rfl:    fludrocortisone (FLORINEF) 0.1 MG tablet, Take 1 tablet (100 mcg total) by mouth 2 (two) times daily. (Patient not taking: Reported on 07/01/2021), Disp: 180 tablet, Rfl: 3   fluticasone (FLONASE) 50 MCG/ACT nasal spray, , Disp: , Rfl:    gabapentin (NEURONTIN) 300 MG capsule, Take 1 capsule by mouth 3 (three) times daily., Disp: , Rfl:    gabapentin (NEURONTIN) 800 MG tablet, TAKE 1 TABLET AT BEDTIME AS NEEDED FOR NEUROPATHY, Disp: 30 tablet, Rfl: 1   ipratropium (ATROVENT) 0.03 % nasal spray, Place 2 sprays into both nostrils 2 (two) times daily., Disp: , Rfl:    levothyroxine (SYNTHROID) 88 MCG tablet, TAKE 1 TABLET(88 MCG) BY MOUTH DAILY BEFORE BREAKFAST, Disp: 90 tablet, Rfl: 3   metolazone (ZAROXOLYN) 2.5 MG tablet, Take 1 tablet by mouth every other Tuesday., Disp: 12 tablet, Rfl: 3   midodrine (PROAMATINE) 10 MG tablet, TAKE 1 TABLET THREE TIMES DAILY WITH MEALS, Disp: 270 tablet, Rfl: 3   mirtazapine (REMERON) 30 MG tablet, 1 tablet at bedtime., Disp: , Rfl:    montelukast (SINGULAIR) 10 MG tablet, Take 10 mg by mouth at bedtime. (Patient not taking: Reported on 05/13/2021), Disp: , Rfl: 1   Multiple Vitamin (MULTIVITAMIN) tablet, Take 1 tablet by mouth daily., Disp: , Rfl:    omeprazole (PRILOSEC) 40 MG capsule, Take 40 mg by mouth daily. (Patient not taking: Reported on 07/01/2021), Disp: , Rfl:    potassium chloride SA (KLOR-CON) 20 MEQ tablet, TAKE 2 TABLETS THREE TIMES DAILY. TAKE AN EXTRA 2 TABLETS ON  DAYS YOU TAKE METOLAZONE, Disp: 572 tablet, Rfl: 0   simvastatin (ZOCOR) 10 MG tablet, Take 1 tablet (10 mg total) by mouth every evening., Disp: 30 tablet, Rfl: 11   spironolactone (ALDACTONE) 25 MG tablet, Take 1 tablet (25 mg total) by mouth every evening. Needs appt for further refills, Disp: 90 tablet, Rfl: 0   torsemide (DEMADEX) 20 MG tablet, Take 1 tablet (20 mg total) by mouth 2 (two) times daily., Disp: 540 tablet, Rfl: 3 Allergies  Allergen Reactions   Ceftin Anaphylaxis    Face and throat swell    Geodon [Ziprasidone Hcl] Hives   Lisinopril Other (See Comments)    angioedema   Shellfish Allergy Other (See Comments)    Gout exacerbation   Shellfish-Derived Products     Other reaction(s): Other   Allopurinol Nausea Only and  Other (See Comments)    weakness   Ativan [Lorazepam] Itching   Sulfa Antibiotics Itching   Ultram [Tramadol Hcl] Itching   Ultram [Tramadol] Itching   Valium [Diazepam] Other (See Comments)    Patient states that diazepam doesn't relax, it has the opposite effect.     Social History   Socioeconomic History   Marital status: Married    Spouse name: Not on file   Number of children: 2   Years of education: 16   Highest education level: Master's degree (e.g., MA, MS, MEng, MEd, MSW, MBA)  Occupational History   Occupation: retired  Tobacco Use   Smoking status: Former    Packs/day: 0.10    Years: 26.00    Pack years: 2.60    Types: Cigarettes    Quit date: 03/23/2021    Years since quitting: 0.3   Smokeless tobacco: Never  Vaping Use   Vaping Use: Never used  Substance and Sexual Activity   Alcohol use: No   Drug use: No   Sexual activity: Yes  Other Topics Concern   Not on file  Social History Narrative   Tobacco Use Cigarettes: Former Smoker, Quit in 2008   No Alcohol   No recreational drug use   Diet: Regular/Low Carb   Exercise: None   Occupation: disabled   Education: Research officer, political party, masters   Children: 2   Firearms: No    Therapist, 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  Constitutional:      Appearance: Normal appearance.  HENT:     Head: Normocephalic.     Nose: Nose normal.     Mouth/Throat:     Mouth: Mucous membranes are moist.     Pharynx: Oropharynx is clear.  Eyes:     Conjunctiva/sclera: Conjunctivae normal.     Pupils: Pupils are equal, round, and reactive to light.  Cardiovascular:     Rate and Rhythm: Normal rate and regular rhythm.     Pulses: Normal pulses.  Pulmonary:     Effort: Pulmonary effort is normal. No respiratory distress.     Breath sounds: Normal breath sounds. No stridor. No wheezing, rhonchi or rales.  Abdominal:     General: Abdomen is flat.     Palpations: Abdomen is soft.  Musculoskeletal:        General: Swelling present. Normal range of motion.     Cervical back: Normal range of motion.     Right lower leg: No edema.     Left lower leg: Edema present.  Skin:    General: Skin is warm and dry.     Capillary Refill: Capillary refill takes less than 2 seconds.  Neurological:     General: No focal deficit present.     Mental Status: She is alert. Mental status is at baseline.  Psychiatric:     Comments: DEPRESSED         Future Appointments  Date Time Provider Abie  08/11/2021  7:40 AM [facility] DEVICE REMOTES [facility]  09/10/2021  7:40 AM [facility] DEVICE REMOTES [facility]  10/02/2021  10:00 AM Gayland Curry, DO ACP-ACP None  12/25/2021  2:30 PM [facility] PA/NP [facility] None  04/01/2022  1:00 PM Hazle Coca, PhD [facility] None  04/01/2022  2:00 PM LBN- NEUROPSYCH TECH [facility]  None  04/08/2022  2:30 PM Hazle Coca, PhD [facility] None  04/15/2022  8:50 AM [name], [name], DO [facility] None     ACTION: Home visit completed

## 2021-07-30 NOTE — Progress Notes (Signed)
Message received from [place] EMT.   ? ?From: [name], EMT ?Sent: 07/30/2021   3:52 PM EST ?To: [name], RN ?Subject: RE: fluid accumulation                        ? ?Yes she skipped several days of her meds due to depression. We had at length discussion today about same I will continue to follow. Thank you!!!! ?

## 2021-07-31 ENCOUNTER — Encounter: Payer: Medicare HMO | Admitting: Psychology

## 2021-08-01 ENCOUNTER — Other Ambulatory Visit: Payer: Self-pay

## 2021-08-01 ENCOUNTER — Ambulatory Visit: Payer: Medicare HMO | Admitting: Podiatry

## 2021-08-01 DIAGNOSIS — B351 Tinea unguium: Secondary | ICD-10-CM

## 2021-08-01 DIAGNOSIS — I83893 Varicose veins of bilateral lower extremities with other complications: Secondary | ICD-10-CM | POA: Insufficient documentation

## 2021-08-01 DIAGNOSIS — E2839 Other primary ovarian failure: Secondary | ICD-10-CM

## 2021-08-01 DIAGNOSIS — E213 Hyperparathyroidism, unspecified: Secondary | ICD-10-CM | POA: Insufficient documentation

## 2021-08-01 DIAGNOSIS — R54 Age-related physical debility: Secondary | ICD-10-CM | POA: Insufficient documentation

## 2021-08-01 DIAGNOSIS — M4310 Spondylolisthesis, site unspecified: Secondary | ICD-10-CM | POA: Insufficient documentation

## 2021-08-01 DIAGNOSIS — M79675 Pain in left toe(s): Secondary | ICD-10-CM

## 2021-08-01 DIAGNOSIS — M79674 Pain in right toe(s): Secondary | ICD-10-CM

## 2021-08-01 DIAGNOSIS — F0390 Unspecified dementia without behavioral disturbance: Secondary | ICD-10-CM | POA: Insufficient documentation

## 2021-08-01 HISTORY — DX: Hyperparathyroidism, unspecified: E21.3

## 2021-08-01 HISTORY — DX: Other primary ovarian failure: E28.39

## 2021-08-01 HISTORY — DX: Spondylolisthesis, site unspecified: M43.10

## 2021-08-01 HISTORY — DX: Varicose veins of bilateral lower extremities with other complications: I83.893

## 2021-08-04 ENCOUNTER — Telehealth (HOSPITAL_COMMUNITY): Payer: Self-pay

## 2021-08-04 NOTE — Telephone Encounter (Signed)
[facility] to request the following for refills: ? ?[facility]: Torsemide--LF 2/9---180 tablets written for 1 tablet BID  ?[name] filled up 2 wks of meds for her, she will f/u once she gets back.  ?[facility] was unable to fill torsemide. ?  ?[facility]: Clonazepam, Celexa  ? ? ?[name], EMT-Paramedic ?08/04/21 ? ?

## 2021-08-06 ENCOUNTER — Ambulatory Visit: Payer: Medicare HMO | Admitting: Nurse Practitioner

## 2021-08-07 ENCOUNTER — Encounter: Payer: Medicare HMO | Admitting: Psychology

## 2021-08-08 ENCOUNTER — Telehealth: Payer: Self-pay

## 2021-08-08 NOTE — Telephone Encounter (Signed)
Call to pt reference next PREP class starting on 08/18/21. She was unable to finish the last class due to illness.  ?Can do M/W 230p-345p.  ?Intake scheduled for 08/11/21 at 230p  ?

## 2021-08-09 ENCOUNTER — Encounter: Payer: Self-pay | Admitting: Podiatry

## 2021-08-09 NOTE — Progress Notes (Signed)
?  Subjective:  ?Patient ID: Rhonda [name], female    DOB: 1951-03-12,  MRN: [id number] ? ?Rhonda [name] presents to clinic today for painful elongated mycotic toenails 1-5 bilaterally which are tender when wearing enclosed shoe gear. Pain is relieved with periodic professional debridement. Diabetes is listed on her problem list, but patient states she is not diabetic. ? ?Patient is accompanied by her caregiver on today's visit. ? ?New problem(s):  Patient is concerned about thick, discolored fingernails and would like to know options available. ? ?PCP is Ardeen Garland, PA-C. ? ?Allergies  ?Allergen Reactions  ? Ceftin Anaphylaxis  ?  Face and throat swell   ? Geodon [Ziprasidone Hcl] Hives  ? Lisinopril Other (See Comments)  ?  angioedema  ? Shellfish Allergy Other (See Comments)  ?  Gout exacerbation  ? Shellfish-Derived Products   ?  Other reaction(s): Other  ? Cefuroxime   ?  Other reaction(s): anaphylaxis  ? Sulfacetamide Sodium-Sulfur   ?  Other reaction(s): itch  ? Ziprasidone   ?  Other reaction(s): hives on left arm  ? Allopurinol Nausea Only and Other (See Comments)  ?  weakness  ? Ativan [Lorazepam] Itching  ? Sulfa Antibiotics Itching  ? Ultram [Tramadol Hcl] Itching  ? Ultram [Tramadol] Itching  ? Valium [Diazepam] Other (See Comments)  ?  Patient states that diazepam doesn't relax, it has the opposite effect.  ? ? ?Review of Systems: Negative except as noted in the HPI. ? ?Objective: No changes noted in today's physical examination. ? ?Constitutional Pt is a pleasant 71 y.o. African American female in NAD. AAO x 3.   ?Vascular Capillary refill time to digits immediate b/l. Faintly palpable pedal pulses b/l. Pedal hair absent. Lower extremity skin temperature gradient within normal limits.  ?Neurologic Protective sensation diminished with 10g monofilament b/l. Vibratory sensation diminished b/l. Clonus negative b/l.  ?Dermatologic Pedal skin with normal turgor, texture and tone bilaterally.  No open wounds bilaterally. No interdigital macerations bilaterally. Toenails 1-5 b/l elongated, discolored, dystrophic, thickened, crumbly with subungual debris and tenderness to dorsal palpation. No hyperkeratotic nor porokeratotic lesions present on today's visit. Pedal skin noted to be dry and flaky b/l lower extremities.  ?Orthopedic: Normal muscle strength 5/5 to all lower extremity muscle groups bilaterally. Decreased ankle joint ROM b/l. Pes planus deformity noted b/l.   ? ?Radiographs: None ? ?Assessment/Plan: ?1. Pain due to onychomycosis of toenails of both feet   ?-Patient was evaluated and treated. All patient's and/or POA's questions/concerns answered on today's visit. ?-Advised patient to discuss onychomycosis of fingernails with her PCP or Dermtologist. ?-Mycotic toenails 1-5 bilaterally were debrided in length and girth with sterile nail nippers and dremel without incident. ?-Patient/POA to call should there be question/concern in the interim.  ? ?Return in about 3 months (around 11/01/2021). ? ?Marzetta Board, DPM  ?

## 2021-08-11 ENCOUNTER — Ambulatory Visit (INDEPENDENT_AMBULATORY_CARE_PROVIDER_SITE_OTHER): Payer: Medicare HMO

## 2021-08-11 DIAGNOSIS — Z95810 Presence of automatic (implantable) cardiac defibrillator: Secondary | ICD-10-CM

## 2021-08-11 DIAGNOSIS — I428 Other cardiomyopathies: Secondary | ICD-10-CM | POA: Diagnosis not present

## 2021-08-11 DIAGNOSIS — I5032 Chronic diastolic (congestive) heart failure: Secondary | ICD-10-CM

## 2021-08-11 LAB — CUP PACEART REMOTE DEVICE CHECK
Battery Remaining Longevity: 76 mo
Battery Voltage: 2.97 V
Brady Statistic AP VP Percent: 0.04 %
Brady Statistic AP VS Percent: 0.01 %
Brady Statistic AS VP Percent: 98.83 %
Brady Statistic AS VS Percent: 1.12 %
Brady Statistic RA Percent Paced: 0.05 %
Brady Statistic RV Percent Paced: 86.67 %
Date Time Interrogation Session: 20230320052605
HighPow Impedance: 80 Ohm
Implantable Lead Implant Date: 20141210
Implantable Lead Implant Date: 20141210
Implantable Lead Implant Date: 20141210
Implantable Lead Location: 753858
Implantable Lead Location: 753859
Implantable Lead Location: 753860
Implantable Lead Model: 4396
Implantable Lead Model: 5076
Implantable Lead Model: 6935
Implantable Pulse Generator Implant Date: 20211118
Lead Channel Impedance Value: 304 Ohm
Lead Channel Impedance Value: 304 Ohm
Lead Channel Impedance Value: 456 Ohm
Lead Channel Impedance Value: 513 Ohm
Lead Channel Impedance Value: 513 Ohm
Lead Channel Impedance Value: 589 Ohm
Lead Channel Pacing Threshold Amplitude: 0.5 V
Lead Channel Pacing Threshold Amplitude: 0.875 V
Lead Channel Pacing Threshold Amplitude: 1.125 V
Lead Channel Pacing Threshold Pulse Width: 0.4 ms
Lead Channel Pacing Threshold Pulse Width: 0.4 ms
Lead Channel Pacing Threshold Pulse Width: 0.4 ms
Lead Channel Sensing Intrinsic Amplitude: 2.875 mV
Lead Channel Sensing Intrinsic Amplitude: 2.875 mV
Lead Channel Sensing Intrinsic Amplitude: 28.5 mV
Lead Channel Sensing Intrinsic Amplitude: 28.5 mV
Lead Channel Setting Pacing Amplitude: 1.5 V
Lead Channel Setting Pacing Amplitude: 2 V
Lead Channel Setting Pacing Amplitude: 2.25 V
Lead Channel Setting Pacing Pulse Width: 0.4 ms
Lead Channel Setting Pacing Pulse Width: 0.4 ms
Lead Channel Setting Sensing Sensitivity: 0.3 mV

## 2021-08-12 ENCOUNTER — Other Ambulatory Visit: Payer: Self-pay | Admitting: Neurology

## 2021-08-12 ENCOUNTER — Other Ambulatory Visit (HOSPITAL_COMMUNITY): Payer: Self-pay | Admitting: *Deleted

## 2021-08-12 ENCOUNTER — Other Ambulatory Visit (HOSPITAL_COMMUNITY): Payer: Self-pay

## 2021-08-12 MED ORDER — TORSEMIDE 20 MG PO TABS: ORAL_TABLET | ORAL | 11 refills | Status: DC

## 2021-08-12 NOTE — Progress Notes (Signed)
Paramedicine Encounter ? ? ? Patient ID: Rhonda [name], female    DOB: 01-09-1951, 71 y.o.   MRN: [id number] ? ?Arrived for home visit for Rhonda [name] who reports to be feeling fine but still struggling with her depression. I reached out to Elias Else about mental health referal with hopes to hear back soon. I will continue to follow up. Vitals and assessment as noted in report. Lower left leg edema noted which is normal for her. Lungs clear on assessment. She has no shortness of breath noted while ambulating.  ? ?Meds reviewed and confirmed and pill box filled and verified.  ? ?Appointments reviewed and confirmed with Rhonda [name]. She plans to restart PREP this week with Rhonda [name] and [facility]. I will continue to assist to get PCP apt rescheduled at [name].  ? ?Refills ?Gabapentin ?Clonazepam ?Citalopram ?Gabapentin 800  ? ? ? ? ? ?Patient Care Team: ?Damaris Schooner as PCP - General (Family Medicine) ?[name], LCSW as Education officer, museum (Licensed Holiday representative) ?Alda Berthold, DO as Consulting Physician (Neurology) ? ?Patient Active Problem List  ? Diagnosis Date Noted  ? Decreased estrogen level 08/01/2021  ? Dementia ([place]) 08/01/2021  ? Frail elderly 08/01/2021  ? Hyperparathyroidism ([place]) 08/01/2021  ? Spondylolisthesis 08/01/2021  ? Varicose veins of bilateral lower extremities with other complications 32/44/0102  ? Lumbago without sciatica 04/30/2021  ? Abnormal gait 06/11/2020  ? Adult failure to thrive syndrome 06/11/2020  ? Allergic rhinitis 06/11/2020  ? Anxiety 06/11/2020  ? Asthma 06/11/2020  ? Benign intracranial hypertension 06/11/2020  ? Body mass index (BMI) 45.0-49.9, adult ([place]) 06/11/2020  ? Bowel incontinence 06/11/2020  ? Cholelithiasis 06/11/2020  ? Chronic sinusitis 06/11/2020  ? 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  ? Daytime somnolence 06/11/2020  ? Diarrhea of presumed infectious origin 06/11/2020  ? Edema 06/11/2020  ? Family history of malignant neoplasm of  gastrointestinal tract 06/11/2020  ? Gout 06/11/2020  ? History of fall 06/11/2020  ? History of infectious disease 06/11/2020  ? Insomnia 06/11/2020  ? Irregular bowel habits 06/11/2020  ? 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 Lumbar spondylosis with myelopathy 06/11/2020  ? Macrocytosis 06/11/2020  ? Mild recurrent major depression ([place]) 06/11/2020  ? Personal history of colonic polyps 06/11/2020  ? Personal history of malignant neoplasm of breast 06/11/2020  ? Repeated falls 06/11/2020  ? Mixed anxiety and depressive disorder 06/11/2020  ? Irritable bowel syndrome 01/04/2020  ? Spinal stenosis of lumbar region 01/03/2020  ? Other symptoms and signs involving the musculoskeletal system 09/11/2019  ? Bilateral leg weakness 08/01/2019  ? Leg swelling 08/01/2019  ? Diabetes mellitus with neuropathy ([place]) 06/15/2019  ? Hypokalemia 11/08/2018  ? CKD (chronic kidney disease) stage 3, GFR 30-59 ml/min (HCC) 11/08/2018  ? Orthostatic hypotension 07/28/2017  ? SVD (spontaneous vaginal delivery)   ? Peripheral neuropathy   ? On home oxygen therapy   ? Migraines   ? Left bundle branch block   ? Hypothyroidism   ? Hypertension   ? Hyperlipidemia   ? Heart murmur   ? GERD (gastroesophageal reflux disease)   ? Exertional shortness of breath   ? Major depressive disorder   ? Back pain   ? Arthritis   ? Generalized anxiety disorder   ? Anemia   ? Chronic respiratory failure ([place]) 09/14/2013  ? 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 Morbid obesity ([place]) 11/01/2012  ? Chronic systolic heart failure ([place]) 10/27/2012  ? Endometrial polyp 01/20/2012  ? Malignant  tumor of breast ([place]) 03/26/2011  ? Unspecified vitamin D deficiency 03/26/2011  ? ? ?Current Outpatient Medications:  ?  acetaZOLAMIDE (DIAMOX) 125 MG tablet, Take 1 tablet (125 mg total) by mouth daily., Disp: 90 tablet, Rfl: 3 ?  carvedilol (COREG) 3.125 MG tablet, TAKE 1 TABLET (3.125 MG) BY MOUTH TWICE DAILY WITH MEALS, Disp: 180  tablet, Rfl: 3 ?  citalopram (CELEXA) 40 MG tablet, Take 40 mg by mouth daily., Disp: , Rfl:  ?  clonazePAM (KLONOPIN) 0.5 MG tablet, Take 0.5 mg by mouth daily as needed for anxiety., Disp: , Rfl:  ?  clonazePAM (KLONOPIN) 0.5 MG tablet, 1 tablet, Disp: , Rfl:  ?  dicyclomine (BENTYL) 20 MG tablet, Take 20 mg by mouth 3 (three) times daily before meals., Disp: , Rfl:  ?  DULoxetine (CYMBALTA) 60 MG capsule, Take 60 mg by mouth daily., Disp: , Rfl:  ?  fexofenadine (ALLEGRA) 180 MG tablet, Take 180 mg by mouth 2 (two) times daily. (Patient not taking: Reported on 07/30/2021), Disp: , Rfl:  ?  fludrocortisone (FLORINEF) 0.1 MG tablet, Take 1 tablet (100 mcg total) by mouth 2 (two) times daily. (Patient not taking: Reported on 07/01/2021), Disp: 180 tablet, Rfl: 3 ?  fluticasone (FLONASE) 50 MCG/ACT nasal spray, , Disp: , Rfl:  ?  gabapentin (NEURONTIN) 300 MG capsule, Take 1 capsule by mouth 3 (three) times daily., Disp: , Rfl:  ?  gabapentin (NEURONTIN) 800 MG tablet, TAKE 1 TABLET AT BEDTIME AS NEEDED FOR NEUROPATHY, Disp: 30 tablet, Rfl: 1 ?  ipratropium (ATROVENT) 0.03 % nasal spray, Place 2 sprays into both nostrils 2 (two) times daily., Disp: , Rfl:  ?  levothyroxine (SYNTHROID) 88 MCG tablet, TAKE 1 TABLET(88 MCG) BY MOUTH DAILY BEFORE BREAKFAST, Disp: 90 tablet, Rfl: 3 ?  metolazone (ZAROXOLYN) 2.5 MG tablet, Take 1 tablet by mouth every other Tuesday., Disp: 12 tablet, Rfl: 3 ?  midodrine (PROAMATINE) 10 MG tablet, TAKE 1 TABLET THREE TIMES DAILY WITH MEALS, Disp: 270 tablet, Rfl: 3 ?  mirtazapine (REMERON) 30 MG tablet, 1 tablet at bedtime., Disp: , Rfl:  ?  montelukast (SINGULAIR) 10 MG tablet, Take 10 mg by mouth at bedtime. (Patient not taking: Reported on 05/13/2021), Disp: , Rfl: 1 ?  Multiple Vitamin (MULTIVITAMIN) tablet, Take 1 tablet by mouth daily., Disp: , Rfl:  ?  omeprazole (PRILOSEC) 40 MG capsule, Take 40 mg by mouth daily. (Patient not taking: Reported on 07/01/2021), Disp: , Rfl:  ?  potassium  chloride SA (KLOR-CON) 20 MEQ tablet, TAKE 2 TABLETS THREE TIMES DAILY. TAKE AN EXTRA 2 TABLETS ON DAYS YOU TAKE METOLAZONE, Disp: 572 tablet, Rfl: 0 ?  simvastatin (ZOCOR) 10 MG tablet, Take 1 tablet (10 mg total) by mouth every evening., Disp: 30 tablet, Rfl: 11 ?  spironolactone (ALDACTONE) 25 MG tablet, Take 1 tablet (25 mg total) by mouth every evening. Needs appt for further refills, Disp: 90 tablet, Rfl: 0 ?  torsemide (DEMADEX) 20 MG tablet, Take 1 tablet (20 mg total) by mouth 2 (two) times daily., Disp: 540 tablet, Rfl: 3 ?Allergies  ?Allergen Reactions  ? Ceftin Anaphylaxis  ?  Face and throat swell   ? Geodon [Ziprasidone Hcl] Hives  ? Lisinopril Other (See Comments)  ?  angioedema  ? Shellfish Allergy Other (See Comments)  ?  Gout exacerbation  ? Shellfish-Derived Products   ?  Other reaction(s): Other  ? Cefuroxime   ?  Other reaction(s): anaphylaxis  ? Sulfacetamide Sodium-Sulfur   ?  Other reaction(s): itch  ? Ziprasidone   ?  Other reaction(s): hives on left arm  ? Allopurinol Nausea Only and Other (See Comments)  ?  weakness  ? Ativan [Lorazepam] Itching  ? Sulfa Antibiotics Itching  ? Ultram [Tramadol Hcl] Itching  ? Ultram [Tramadol] Itching  ? Valium [Diazepam] Other (See Comments)  ?  Patient states that diazepam doesn't relax, it has the opposite effect.  ? ? ? ?Social History  ? ?Socioeconomic History  ? Marital status: Married  ?  Spouse name: Not on file  ? Number of children: 2  ? Years of education: 62  ? Highest education level: Master's degree (e.g., MA, MS, MEng, MEd, MSW, MBA)  ?Occupational History  ? Occupation: retired  ?Tobacco Use  ? Smoking status: Former  ?  Packs/day: 0.10  ?  Years: 26.00  ?  Pack years: 2.60  ?  Types: Cigarettes  ?  Quit date: 03/23/2021  ?  Years since quitting: 0.3  ? Smokeless tobacco: Never  ?Vaping Use  ? Vaping Use: Never used  ?Substance and Sexual Activity  ? Alcohol use: No  ? Drug use: No  ? Sexual activity: Yes  ?Other Topics Concern  ? Not on  file  ?Social History Narrative  ? Tobacco Use Cigarettes: Former Research scientist (life sciences), Quit in 2008  ? No Alcohol  ? No recreational drug use  ? Diet: Regular/Low Carb  ? Exercise: None  ? Occupation: disabled  ? Education: [employer]

## 2021-08-13 NOTE — Progress Notes (Signed)
[facility] PREP Evaluation ? ?Patient Details  ?Name: Rhonda [name] ?MRN: [id number] ?Date of Birth: 1950-08-09 ?Age: 71 y.o. ?PCP: Rhonda Garland, PA-C ? ?Vitals:  ? 08/13/21 1528  ?BP: 114/60  ?Pulse: 85  ?Weight: 224 lb 3.2 oz (101.7 kg)  ? ? ? ?Past Medical History:  ?Diagnosis Date  ? Anemia   ? Arthritis   ? Right knee  ? Asthma   ? Back pain   ? Disk problem  ? Breast cancer, stage 1 ([place]) 03/26/2011  ? Left; completed chemotherapy and radiation treatments  ? Cardiomyopathy   ? Chronic respiratory failure ([place]) 09/14/2013  ? Chronic systolic heart failure ([place])   ? a) NICM b) ECHO (03/2013) EF 20-25% c) ECHO (09/2013) EF 45-50%, grade I DD  ? CKD (chronic kidney disease) stage 3, GFR 30-59 ml/min ([place]) 11/08/2018  ? Complication of anesthesia   ? History of low blood pressure after surgery; attributed to lying flat  ? Diabetes mellitus   ? "diet controlled" (05/03/2013)  ? Exertional shortness of breath   ? Generalized anxiety disorder   ? GERD (gastroesophageal reflux disease)   ? Gout   ? Heart murmur   ? Hyperlipidemia   ? Hypertension   ? Hypokalemia 11/08/2018  ? Hypothyroidism   ? Left bundle branch block   ? s/p CRT-D (04/2013)  ? Major depressive disorder   ? Migraines   ? On home oxygen therapy   ? "2L suppose to be q night" (05/03/2013)  ? Orthostatic hypotension 07/28/2017  ? Peripheral neuropathy   ? Feet  ? SVD (spontaneous vaginal delivery)   ? x 2  ? Unspecified vitamin D deficiency 03/26/2011  ? Does not take meds  ? ?Past Surgical History:  ?Procedure Laterality Date  ? BI-VENTRICULAR IMPLANTABLE CARDIOVERTER DEFIBRILLATOR N/A 05/03/2013  ? Procedure: BI-VENTRICULAR IMPLANTABLE CARDIOVERTER DEFIBRILLATOR  (CRT-D);  Surgeon: Rhonda Lance, MD;  Location: [facility] CATH LAB;  Service: Cardiovascular;  Laterality: N/A;  ? BI-VENTRICULAR IMPLANTABLE CARDIOVERTER DEFIBRILLATOR  (CRT-D)  05/03/2013  ? MDT CRTD implanted by Dr Rhonda [name] for non ischemic cardiomyopathy  ? BIOPSY  05/31/2018  ? Procedure: BIOPSY;   Surgeon: Rhonda Juniper, MD;  Location: [facility] ENDOSCOPY;  Service: Gastroenterology;;  ? BIV ICD GENERATOR CHANGEOUT N/A 04/11/2020  ? Procedure: BIV ICD GENERATOR CHANGEOUT;  Surgeon: Rhonda Lance, MD;  Location: [facility] CV LAB;  Service: Cardiovascular;  Laterality: N/A;  ? BREAST LUMPECTOMY Left 07/28/2010  ? CARDIAC CATHETERIZATION  2010  ? NORMAL CORONARY ARTERIES  ? 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--11 SURGERIES    ? PT HAS REMOVABLE SPEECH BULB-TAKES IT OUT BEFORE HER SURGERY  ? COLONOSCOPY    ? COLONOSCOPY N/A 05/31/2018  ? Procedure: COLONOSCOPY;  Surgeon: Rhonda Juniper, MD;  Location: [facility]OS[facility];  Service: Gastroenterology;  Laterality: N/A;  ? HYSTEROSCOPY WITH D & C  01/20/2012  ? Procedure: DILATATION AND CURETTAGE /HYSTEROSCOPY;  Surgeon: Rhonda Asal, MD;  Location: [facility];  Service: Gynecology;  Laterality: N/A;  with trueclear  ? PORT-A-CATH REMOVAL  09/23/2011  ? Procedure: REMOVAL PORT-A-CATH;  Surgeon: Rhonda Bookbinder, MD;  Location: [facility];  Service: General;  Laterality: N/A;  Port Removal  ? PORTACATH PLACEMENT  2012  ? TONSILLECTOMY  1960's  ? TOTAL KNEE ARTHROPLASTY  05/10/2012  ? Procedure: TOTAL KNEE ARTHROPLASTY;  Surgeon: Rhonda Pole, MD;  Location: [facility];  Service: Orthopedics;  Laterality: Right;  ? WISDOM TOOTH EXTRACTION    ? ?Social History  ? ?  Tobacco Use  ?Smoking Status Former  ? Packs/day: 0.10  ? Years: 26.00  ? Pack years: 2.60  ? Types: Cigarettes  ? Quit date: 03/23/2021  ? Years since quitting: 0.3  ?Smokeless Tobacco Never  ?Class will start on 08/18/21 M/W 230p-345pm At [facility]  ? ?Rhonda [name] ?08/13/2021, 3:30 PM ? ? ?

## 2021-08-13 NOTE — Progress Notes (Signed)
EPIC Encounter for ICM Monitoring ? ?Patient Name: Rhonda [name] is a 71 y.o. female ?Date: 08/13/2021 ?Primary Care Physican: Ardeen Garland, PA-C ?Primary Cardiologist: [place] ?Electrophysiologist: Lovena Le ?Bi-V Pacing: 98.7%   ?07/30/2021  Weight: 230 lbs  ?  ?  ?Transmission reviewed. ?  ?Optivol thoracic impedance suggesting fluid levels returned to normal.  ?  ?Prescribed:  Patient participating in paramedicine program to assist with meds. ?Torsemide 20 mg 4 tablets (80 mg total) by mouth every morning and 3 tablets (60 mg total) every evening.   ?Potassium  20 meq 2 tablets (40 mEq total) by mouth 3 (three) times daily.  Take extra 2 tablets on days you take Metolazone. ?Metolazone 2.'5mg'$  1 tablet every Tuesday. ?Spironolactone 25 mg take 1 tablet by mouth every evening. ?  ?Labs: ?05/01/2021 Creatinine 1.81, BUN 45, Potassium 3.7, Sodium 140, GFR 30 ?04/23/2021 Creatinine 1.71, BUN 33, Potassium 2.9, Sodium 137, GFR 32 ?0/26/2022 Creatinine 1.58, BUN 29, Potassium 3.9, Sodium 138, GFR 35, BNP 68.6 ?A complete set of results can be found in Results Review. ?  ?Recommendations:  No changes.  ?  ?Follow-up plan: ICM clinic phone appointment on 09/08/2021.   91 day device clinic remote transmission 09/10/2021.   ?  ?EP/Cardiology Office Visits:    Recall 07/26/2021 with Dr Lovena Le.   ?  ?Copy of ICM check sent to Dr. Lovena Le.      ?  ?3 month ICM trend: 08/11/2021. ? ? ? ?12-14 Month ICM trend:  ? ? ? ?[name], RN ?08/13/2021 ?3:18 PM ? ?

## 2021-08-14 DIAGNOSIS — M1000 Idiopathic gout, unspecified site: Secondary | ICD-10-CM | POA: Diagnosis not present

## 2021-08-14 DIAGNOSIS — N1832 Chronic kidney disease, stage 3b: Secondary | ICD-10-CM | POA: Diagnosis not present

## 2021-08-14 DIAGNOSIS — Z79899 Other long term (current) drug therapy: Secondary | ICD-10-CM | POA: Diagnosis not present

## 2021-08-14 DIAGNOSIS — I509 Heart failure, unspecified: Secondary | ICD-10-CM | POA: Diagnosis not present

## 2021-08-19 ENCOUNTER — Other Ambulatory Visit (HOSPITAL_COMMUNITY): Payer: Self-pay

## 2021-08-19 NOTE — Progress Notes (Signed)
Paramedicine Encounter ? ? ? Patient ID: Rhonda [name], female    DOB: 1951/03/18, 71 y.o.   MRN: [id number] ? ?Arrived for home visit for Rhonda [name] who reports feeling good with no complaints today. She reports her anxiety and depression has improved this week. I obtained vitals and assessment. Some lower right leg swelling. Lungs clear. Weigt up by 2 lbs this week. She reports she has been drinking more fluids this week. I reminded her the 68 fl. Oz. Rule and she verbalized understanding.  ? ?Meds were reviewed and confirmed. Pill box filled accordingly.  ? ?MISSING '800MG'$  GABAPENTIN AND KLONOPIN.  (Will get refills sent in by PCP this week) ? ?Appointments reviewed. Husband says [place] [facility] appointment went well and no med changes were made.  ? ?She follows up with PCP on Thursday at 1:00.  ? ?Home visit complete. I will see Rhonda [name] in the home in one week.  ? ?Refills: ?Gabapentin '300mg'$  ? ?Patient Care Team: ?Damaris Schooner as PCP - General (Family Medicine) ?[name], LCSW as Education officer, museum (Licensed Holiday representative) ?Alda Berthold, DO as Consulting Physician (Neurology) ? ?Patient Active Problem List  ? Diagnosis Date Noted  ? Decreased estrogen level 08/01/2021  ? Dementia ([place]) 08/01/2021  ? Frail elderly 08/01/2021  ? Hyperparathyroidism ([place]) 08/01/2021  ? Spondylolisthesis 08/01/2021  ? Varicose veins of bilateral lower extremities with other complications 72/53/6644  ? Lumbago without sciatica 04/30/2021  ? Abnormal gait 06/11/2020  ? Adult failure to thrive syndrome 06/11/2020  ? Allergic rhinitis 06/11/2020  ? Anxiety 06/11/2020  ? Asthma 06/11/2020  ? Benign intracranial hypertension 06/11/2020  ? Body mass index (BMI) 45.0-49.9, adult ([place]) 06/11/2020  ? Bowel incontinence 06/11/2020  ? Cholelithiasis 06/11/2020  ? Chronic sinusitis 06/11/2020  ? 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  ? Daytime somnolence 06/11/2020  ? Diarrhea of presumed infectious origin 06/11/2020  ? Edema  06/11/2020  ? Family history of malignant neoplasm of gastrointestinal tract 06/11/2020  ? Gout 06/11/2020  ? History of fall 06/11/2020  ? History of infectious disease 06/11/2020  ? Insomnia 06/11/2020  ? Irregular bowel habits 06/11/2020  ? 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 Lumbar spondylosis with myelopathy 06/11/2020  ? Macrocytosis 06/11/2020  ? Mild recurrent major depression ([place]) 06/11/2020  ? Personal history of colonic polyps 06/11/2020  ? Personal history of malignant neoplasm of breast 06/11/2020  ? Repeated falls 06/11/2020  ? Mixed anxiety and depressive disorder 06/11/2020  ? Irritable bowel syndrome 01/04/2020  ? Spinal stenosis of lumbar region 01/03/2020  ? Other symptoms and signs involving the musculoskeletal system 09/11/2019  ? Bilateral leg weakness 08/01/2019  ? Leg swelling 08/01/2019  ? 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 Hypokalemia 11/08/2018  ? CKD (chronic kidney disease) stage 3, GFR 30-59 ml/min ([facility]) 11/08/2018  ? Orthostatic hypotension 07/28/2017  ? SVD (spontaneous vaginal delivery)   ? Peripheral neuropathy   ? On home oxygen therapy   ? Migraines   ? Left bundle branch block   ? Hypothyroidism   ? Hypertension   ? Hyperlipidemia   ? Heart murmur   ? GERD (gastroesophageal reflux disease)   ? Exertional shortness of breath   ? Major depressive disorder   ? Back pain   ? Arthritis   ? Generalized anxiety disorder   ? Anemia   ? Chronic respiratory failure ([place]) 09/14/2013  ? 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 Morbid obesity ([place]) 11/01/2012  ? Chronic systolic heart  failure ([place]) 10/27/2012  ? Endometrial polyp 01/20/2012  ? Malignant tumor of breast ([place]) 03/26/2011  ? Unspecified vitamin D deficiency 03/26/2011  ? ? ?Current Outpatient Medications:  ?  acetaZOLAMIDE (DIAMOX) 125 MG tablet, Take 1 tablet (125 mg total) by mouth daily., Disp: 90 tablet, Rfl: 3 ?  carvedilol (COREG) 3.125 MG tablet, TAKE 1 TABLET  (3.125 MG) BY MOUTH TWICE DAILY WITH MEALS, Disp: 180 tablet, Rfl: 3 ?  citalopram (CELEXA) 40 MG tablet, Take 40 mg by mouth daily., Disp: , Rfl:  ?  clonazePAM (KLONOPIN) 0.5 MG tablet, Take 0.5 mg by mouth daily as needed for anxiety., Disp: , Rfl:  ?  clonazePAM (KLONOPIN) 0.5 MG tablet, 1 tablet, Disp: , Rfl:  ?  dicyclomine (BENTYL) 20 MG tablet, Take 20 mg by mouth 3 (three) times daily before meals., Disp: , Rfl:  ?  DULoxetine (CYMBALTA) 60 MG capsule, Take 60 mg by mouth daily., Disp: , Rfl:  ?  fexofenadine (ALLEGRA) 180 MG tablet, Take 180 mg by mouth 2 (two) times daily. (Patient not taking: Reported on 07/30/2021), Disp: , Rfl:  ?  fludrocortisone (FLORINEF) 0.1 MG tablet, Take 1 tablet (100 mcg total) by mouth 2 (two) times daily. (Patient not taking: Reported on 07/01/2021), Disp: 180 tablet, Rfl: 3 ?  fluticasone (FLONASE) 50 MCG/ACT nasal spray, , Disp: , Rfl:  ?  gabapentin (NEURONTIN) 300 MG capsule, Take 1 capsule by mouth 3 (three) times daily., Disp: , Rfl:  ?  gabapentin (NEURONTIN) 800 MG tablet, TAKE 1 TABLET AT BEDTIME AS NEEDED FOR NEUROPATHY, Disp: 30 tablet, Rfl: 1 ?  ipratropium (ATROVENT) 0.03 % nasal spray, Place 2 sprays into both nostrils 2 (two) times daily., Disp: , Rfl:  ?  levothyroxine (SYNTHROID) 88 MCG tablet, TAKE 1 TABLET(88 MCG) BY MOUTH DAILY BEFORE BREAKFAST, Disp: 90 tablet, Rfl: 3 ?  metolazone (ZAROXOLYN) 2.5 MG tablet, Take 1 tablet by mouth every other Tuesday., Disp: 12 tablet, Rfl: 3 ?  midodrine (PROAMATINE) 10 MG tablet, TAKE 1 TABLET THREE TIMES DAILY WITH MEALS, Disp: 270 tablet, Rfl: 3 ?  mirtazapine (REMERON) 30 MG tablet, 1 tablet at bedtime., Disp: , Rfl:  ?  montelukast (SINGULAIR) 10 MG tablet, Take 10 mg by mouth at bedtime. (Patient not taking: Reported on 05/13/2021), Disp: , Rfl: 1 ?  Multiple Vitamin (MULTIVITAMIN) tablet, Take 1 tablet by mouth daily., Disp: , Rfl:  ?  omeprazole (PRILOSEC) 40 MG capsule, Take 40 mg by mouth daily. (Patient not  taking: Reported on 07/01/2021), Disp: , Rfl:  ?  potassium chloride SA (KLOR-CON) 20 MEQ tablet, TAKE 2 TABLETS THREE TIMES DAILY. TAKE AN EXTRA 2 TABLETS ON DAYS YOU TAKE METOLAZONE, Disp: 572 tablet, Rfl: 0 ?  simvastatin (ZOCOR) 10 MG tablet, Take 1 tablet (10 mg total) by mouth every evening., Disp: 30 tablet, Rfl: 11 ?  spironolactone (ALDACTONE) 25 MG tablet, Take 1 tablet (25 mg total) by mouth every evening. Needs appt for further refills, Disp: 90 tablet, Rfl: 0 ?  torsemide (DEMADEX) 20 MG tablet, Take 4 tablets (80 mg total) by mouth every morning AND 3 tablets (60 mg total) every evening., Disp: 210 tablet, Rfl: 11 ?Allergies  ?Allergen Reactions  ? Ceftin Anaphylaxis  ?  Face and throat swell   ? Geodon [Ziprasidone Hcl] Hives  ? Lisinopril Other (See Comments)  ?  angioedema  ? Shellfish Allergy Other (See Comments)  ?  Gout exacerbation  ? Shellfish-Derived Products   ?  Other reaction(s):  Other  ? Cefuroxime   ?  Other reaction(s): anaphylaxis  ? Sulfacetamide Sodium-Sulfur   ?  Other reaction(s): itch  ? Ziprasidone   ?  Other reaction(s): hives on left arm  ? Allopurinol Nausea Only and Other (See Comments)  ?  weakness  ? Ativan [Lorazepam] Itching  ? Sulfa Antibiotics Itching  ? Ultram [Tramadol Hcl] Itching  ? Ultram [Tramadol] Itching  ? Valium [Diazepam] Other (See Comments)  ?  Patient states that diazepam doesn't relax, it has the opposite effect.  ? ? ? ?Social History  ? ?Socioeconomic History  ? Marital status: Married  ?  Spouse name: Not on file  ? Number of children: 2  ? Years of education: 53  ? Highest education level: Master's degree (e.g., MA, MS, MEng, MEd, MSW, MBA)  ?Occupational History  ? Occupation: retired  ?Tobacco Use  ? Smoking status: Former  ?  Packs/day: 0.10  ?  Years: 26.00  ?  Pack years: 2.60  ?  Types: Cigarettes  ?  Quit date: 03/23/2021  ?  Years since quitting: 0.4  ? Smokeless tobacco: Never  ?Vaping Use  ? Vaping Use: Never used  ?Substance and Sexual  Activity  ? Alcohol use: No  ? Drug use: No  ? Sexual activity: Yes  ?Other Topics Concern  ? Not on file  ?Social History Narrative  ? Tobacco Use Cigarettes: Former Research scientist (life sciences), Quit in 2008  ? No Alcohol  ? No recreational drug

## 2021-08-20 ENCOUNTER — Other Ambulatory Visit (HOSPITAL_COMMUNITY): Payer: Self-pay | Admitting: Internal Medicine

## 2021-08-20 NOTE — Progress Notes (Signed)
Remote ICD transmission.   

## 2021-08-21 ENCOUNTER — Other Ambulatory Visit (HOSPITAL_COMMUNITY): Payer: Self-pay

## 2021-08-21 DIAGNOSIS — Z79899 Other long term (current) drug therapy: Secondary | ICD-10-CM | POA: Diagnosis not present

## 2021-08-21 DIAGNOSIS — F330 Major depressive disorder, recurrent, mild: Secondary | ICD-10-CM | POA: Diagnosis not present

## 2021-08-21 NOTE — Progress Notes (Signed)
Paramedicine Encounter ? ? ? Patient ID: Rhonda [name], female    DOB: 1951/02/06, 71 y.o.   MRN: [id number] ? ?Received phone call from PCP Domenic Moras from [facility] who wanted to review and change some of [name]'s medications.  Here are the following changes - ?-Gabapentin '300mg'$ . Morning and '600mg'$ . @ bedtime ?-Duloxetine '30mg'$  in the morning ?-Stop Remeron ? ?These will be sent to [facility]  ?Went out to home to make medication adjustments in pill box today @ 3:30 ? ?We will follow up in the home on Tuesday and Domenic Moras PCP requests a phone call following home visit. ? ?Domenic Moras M.D.  [phone] ?RN [place] [phone] ? ?[name] CHP ? ? ?ACTION: ?Home visit completed ? ? ? ? ? ? ?

## 2021-08-26 ENCOUNTER — Other Ambulatory Visit (HOSPITAL_COMMUNITY): Payer: Self-pay

## 2021-08-26 ENCOUNTER — Telehealth: Payer: Self-pay

## 2021-08-26 NOTE — Progress Notes (Signed)
Paramedicine Encounter ? ? ? Patient ID: Rhonda [name], female    DOB: 1950/08/14, 71 y.o.   MRN: [id number] ? ?Arrived for home visit for Rhonda [name] who reports feeling okay just more anxious than normal this week. She has been without her clonazepam for one week. PCP was to have sent this in to pharmacy last week. I contacted PCP office and Pharmacy to request refill. Otherwise she reports feeling fine. Today she appears to have more energy than previous visits and noted to be moving around easier. She reports overall feeling better since the medication changes last week.  ? ?Vitals were obtained and are as noted.  ? ?Noted to have some slight lower leg edema in the right leg. Lungs clear. She reports her normal sore throat and sinus headache that she has daily. She has been without allergy sinus meds for several months. (Allegra, Singulair)  ? ?Medications reviewed and confirmed. Pill box filled for one week. Missing Cymbalta and Clonazepam.  ? ?Mrs. Hundal reminded of diet and medication compliance. She verbalized agreement. Her and her husband are aware of medications missing and left written instructions on where they go once obtained. Home visit complete. I will see Cornelious in one week.  ? ?-I did call Dr. [name] to update her of visit, leaving a voicemail.  ? ? ?Patient Care Team: ?Damaris Schooner as PCP - General (Family Medicine) ?[name], LCSW as Education officer, museum (Licensed Holiday representative) ?Alda Berthold, DO as Consulting Physician (Neurology) ? ?Patient Active Problem List  ? Diagnosis Date Noted  ? Decreased estrogen level 08/01/2021  ? Dementia ([place]) 08/01/2021  ? Frail elderly 08/01/2021  ? Hyperparathyroidism ([place]) 08/01/2021  ? Spondylolisthesis 08/01/2021  ? Varicose veins of bilateral lower extremities with other complications 25/09/3974  ? Lumbago without sciatica 04/30/2021  ? Abnormal gait 06/11/2020  ? Adult failure to thrive syndrome 06/11/2020  ? Allergic rhinitis  06/11/2020  ? Anxiety 06/11/2020  ? Asthma 06/11/2020  ? Benign intracranial hypertension 06/11/2020  ? Body mass index (BMI) 45.0-49.9, adult ([place]) 06/11/2020  ? Bowel incontinence 06/11/2020  ? Cholelithiasis 06/11/2020  ? Chronic sinusitis 06/11/2020  ? 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  ? Daytime somnolence 06/11/2020  ? Diarrhea of presumed infectious origin 06/11/2020  ? Edema 06/11/2020  ? Family history of malignant neoplasm of gastrointestinal tract 06/11/2020  ? Gout 06/11/2020  ? History of fall 06/11/2020  ? History of infectious disease 06/11/2020  ? Insomnia 06/11/2020  ? Irregular bowel habits 06/11/2020  ? 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 Lumbar spondylosis with myelopathy 06/11/2020  ? Macrocytosis 06/11/2020  ? Mild recurrent major depression ([place]) 06/11/2020  ? Personal history of colonic polyps 06/11/2020  ? Personal history of malignant neoplasm of breast 06/11/2020  ? Repeated falls 06/11/2020  ? Mixed anxiety and depressive disorder 06/11/2020  ? Irritable bowel syndrome 01/04/2020  ? Spinal stenosis of lumbar region 01/03/2020  ? Other symptoms and signs involving the musculoskeletal system 09/11/2019  ? Bilateral leg weakness 08/01/2019  ? Leg swelling 08/01/2019  ? 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 Hypokalemia 11/08/2018  ? CKD (chronic kidney disease) stage 3, GFR 30-59 ml/min ([facility]) 11/08/2018  ? Orthostatic hypotension 07/28/2017  ? SVD (spontaneous vaginal delivery)   ? Peripheral neuropathy   ? On home oxygen therapy   ? Migraines   ? Left bundle branch block   ? Hypothyroidism   ? Hypertension   ? Hyperlipidemia   ? Heart murmur   ?  GERD (gastroesophageal reflux disease)   ? Exertional shortness of breath   ? Major depressive disorder   ? Back pain   ? Arthritis   ? Generalized anxiety disorder   ? Anemia   ? Chronic respiratory failure ([place]) 09/14/2013  ? 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 Morbid obesity ([place]) 11/01/2012  ?  Chronic systolic heart failure ([place]) 10/27/2012  ? Endometrial polyp 01/20/2012  ? Malignant tumor of breast ([place]) 03/26/2011  ? Unspecified vitamin D deficiency 03/26/2011  ? ? ?Current Outpatient Medications:  ?  acetaZOLAMIDE (DIAMOX) 125 MG tablet, Take 1 tablet (125 mg total) by mouth daily., Disp: 90 tablet, Rfl: 3 ?  carvedilol (COREG) 3.125 MG tablet, TAKE 1 TABLET (3.125 MG) BY MOUTH TWICE DAILY WITH MEALS, Disp: 180 tablet, Rfl: 3 ?  citalopram (CELEXA) 40 MG tablet, Take 40 mg by mouth daily., Disp: , Rfl:  ?  clonazePAM (KLONOPIN) 0.5 MG tablet, Take 0.5 mg by mouth daily as needed for anxiety., Disp: , Rfl:  ?  clonazePAM (KLONOPIN) 0.5 MG tablet, 1 tablet, Disp: , Rfl:  ?  dicyclomine (BENTYL) 20 MG tablet, Take 20 mg by mouth 3 (three) times daily before meals., Disp: , Rfl:  ?  DULoxetine (CYMBALTA) 60 MG capsule, Take 60 mg by mouth daily., Disp: , Rfl:  ?  fexofenadine (ALLEGRA) 180 MG tablet, Take 180 mg by mouth 2 (two) times daily., Disp: , Rfl:  ?  fludrocortisone (FLORINEF) 0.1 MG tablet, Take 1 tablet (100 mcg total) by mouth 2 (two) times daily. (Patient not taking: Reported on 08/19/2021), Disp: 180 tablet, Rfl: 3 ?  fluticasone (FLONASE) 50 MCG/ACT nasal spray, , Disp: , Rfl:  ?  gabapentin (NEURONTIN) 300 MG capsule, Take 1 capsule by mouth 3 (three) times daily., Disp: , Rfl:  ?  gabapentin (NEURONTIN) 800 MG tablet, TAKE 1 TABLET AT BEDTIME AS NEEDED FOR NEUROPATHY, Disp: 30 tablet, Rfl: 1 ?  ipratropium (ATROVENT) 0.03 % nasal spray, Place 2 sprays into both nostrils 2 (two) times daily., Disp: , Rfl:  ?  levothyroxine (SYNTHROID) 88 MCG tablet, TAKE 1 TABLET(88 MCG) BY MOUTH DAILY BEFORE BREAKFAST, Disp: 90 tablet, Rfl: 3 ?  metolazone (ZAROXOLYN) 2.5 MG tablet, Take 1 tablet by mouth every other Tuesday., Disp: 12 tablet, Rfl: 3 ?  midodrine (PROAMATINE) 10 MG tablet, TAKE 1 TABLET THREE TIMES DAILY WITH MEALS, Disp: 270 tablet, Rfl: 3 ?  mirtazapine (REMERON) 30 MG tablet, 1 tablet  at bedtime., Disp: , Rfl:  ?  montelukast (SINGULAIR) 10 MG tablet, Take 10 mg by mouth at bedtime. (Patient not taking: Reported on 05/13/2021), Disp: , Rfl: 1 ?  Multiple Vitamin (MULTIVITAMIN) tablet, Take 1 tablet by mouth daily., Disp: , Rfl:  ?  omeprazole (PRILOSEC) 40 MG capsule, Take 40 mg by mouth daily. (Patient not taking: Reported on 07/01/2021), Disp: , Rfl:  ?  potassium chloride SA (KLOR-CON M) 20 MEQ tablet, TAKE 2 TABLETS THREE TIMES DAILY. TAKE AN EXTRA 2 TABLETS ON DAYS YOU TAKE METOLAZONE, Disp: 572 tablet, Rfl: 0 ?  simvastatin (ZOCOR) 10 MG tablet, Take 1 tablet (10 mg total) by mouth every evening., Disp: 30 tablet, Rfl: 11 ?  spironolactone (ALDACTONE) 25 MG tablet, Take 1 tablet (25 mg total) by mouth every evening. Needs appt for further refills, Disp: 90 tablet, Rfl: 0 ?  torsemide (DEMADEX) 20 MG tablet, Take 4 tablets (80 mg total) by mouth every morning AND 3 tablets (60 mg total) every evening.,  Disp: 210 tablet, Rfl: 11 ?Allergies  ?Allergen Reactions  ? Ceftin Anaphylaxis  ?  Face and throat swell   ? Geodon [Ziprasidone Hcl] Hives  ? Lisinopril Other (See Comments)  ?  angioedema  ? Shellfish Allergy Other (See Comments)  ?  Gout exacerbation  ? Shellfish-Derived Products   ?  Other reaction(s): Other  ? Cefuroxime   ?  Other reaction(s): anaphylaxis  ? Sulfacetamide Sodium-Sulfur   ?  Other reaction(s): itch  ? Ziprasidone   ?  Other reaction(s): hives on left arm  ? Allopurinol Nausea Only and Other (See Comments)  ?  weakness  ? Ativan [Lorazepam] Itching  ? Sulfa Antibiotics Itching  ? Ultram [Tramadol Hcl] Itching  ? Ultram [Tramadol] Itching  ? Valium [Diazepam] Other (See Comments)  ?  Patient states that diazepam doesn't relax, it has the opposite effect.  ? ? ? ?Social History  ? ?Socioeconomic History  ? Marital status: Married  ?  Spouse name: Not on file  ? Number of children: 2  ? Years of education: 56  ? Highest education level: Master's degree (e.g., MA, MS, MEng,  MEd, MSW, MBA)  ?Occupational History  ? Occupation: retired  ?Tobacco Use  ? Smoking status: Former  ?  Packs/day: 0.10  ?  Years: 26.00  ?  Pack years: 2.60  ?  Types: Cigarettes  ?  Quit date: 03/23/2021

## 2021-08-26 NOTE — Telephone Encounter (Signed)
Call to pt reference missed PREP class. Pt sts had some medication changes that left her not feeling well enough to drive.  ?Has missed 3 first classes. Explained to pt if she misses tomorrow, she will have to start another class at another time.  ?Pt expressed understanding.  ?

## 2021-08-28 ENCOUNTER — Telehealth (HOSPITAL_COMMUNITY): Payer: Self-pay

## 2021-08-28 NOTE — Telephone Encounter (Signed)
Call received from PCP office for Rhonda [name] reporting they want her to take her '30mg'$  Cymbalta only once a week and she will decrease to '20mg'$  next week.  ? ?They also want her to start 1064mg of B12 vitamin daily.  ? ?I will make patient and her husband aware of same and I will follow up next week.  ?

## 2021-08-29 ENCOUNTER — Telehealth (HOSPITAL_COMMUNITY): Payer: Self-pay | Admitting: Surgery

## 2021-08-29 ENCOUNTER — Other Ambulatory Visit (HOSPITAL_COMMUNITY): Payer: Self-pay | Admitting: Surgery

## 2021-08-29 DIAGNOSIS — I5032 Chronic diastolic (congestive) heart failure: Secondary | ICD-10-CM

## 2021-08-29 MED ORDER — POTASSIUM CHLORIDE CRYS ER 20 MEQ PO TBCR: 60.0000 meq | EXTENDED_RELEASE_TABLET | Freq: Three times a day (TID) | ORAL | 0 refills | Status: DC

## 2021-08-29 NOTE — Progress Notes (Signed)
Unfortunately, pt did not attend 4/5 PREP class-has not attended any of the recent class start.  ?No call from patient. At this point, we will consider her withdrawn from class.  ?Will need another referral from Providers if ordered again.  ? ?

## 2021-08-29 NOTE — Progress Notes (Signed)
Medication updated in [facility] ?

## 2021-08-29 NOTE — Telephone Encounter (Signed)
Patient called regarding faxed lab results and recommendations per Marlyce Huge PA. She tells me that [name] HF Comm Paramedic assists her with her pill box.  And she seems quite unsure how much potassium she takes daily.  She was instructed per provider to increase Potassium to 60 meq three times daily and will return Thursday afternoon for repeat lab work. ?

## 2021-09-01 ENCOUNTER — Other Ambulatory Visit (HOSPITAL_COMMUNITY): Payer: Self-pay | Admitting: Internal Medicine

## 2021-09-02 ENCOUNTER — Other Ambulatory Visit (HOSPITAL_COMMUNITY): Payer: Self-pay

## 2021-09-02 NOTE — Progress Notes (Signed)
Paramedicine Encounter ? ? ? Patient ID: Rhonda [name], female    DOB: [birth year], 71 y.o.   MRN: [id number] ? ? ?Arrived for home paramedicine visit for Rhonda [name] where she was walking about in her home with no obvious respiratory shortness of breath or difficulty walking. She reports feeling okay but getting short winded sometimes while doing daily activities. This is not abnormal for her. She denied chest pain, dizziness. She missed a few doses of her medications over the last week. We discussed the importance of taking her medicines every day. She agreed.  ? ?Vitals and assessment as noted. Lower leg swelling decreased from last week. Weight is down two pounds.  ? ?METOLAZONE DOSE TODAY ?I gave her her morning doses of meds during our visit.  ? ?She discussed continued depression and wanting to get out of the house for activities. I advised her to reach out to [employer] for event schedules. She was grateful for same. I left their address and contact number.  ? ?We reviewed appointments and confirmed same. I plan to see Rhonda [name] in one week. Home visit complete.  ? ? ?NO REFILLS  ? ? ?Patient Care Team: ?Damaris Schooner as PCP - General (Family Medicine) ?[name], LCSW as Education officer, museum (Licensed Holiday representative) ?Alda Berthold, DO as Consulting Physician (Neurology) ?[name], EMT as Paramedic ?[name], MD as Attending Physician (Family Medicine) ? ?Patient Active Problem List  ? Diagnosis Date Noted  ? Decreased estrogen level 08/01/2021  ? Dementia ([place]) 08/01/2021  ? Frail elderly 08/01/2021  ? Hyperparathyroidism ([place]) 08/01/2021  ? Spondylolisthesis 08/01/2021  ? Varicose veins of bilateral lower extremities with other complications 97/67/3419  ? Lumbago without sciatica 04/30/2021  ? Abnormal gait 06/11/2020  ? Adult failure to thrive syndrome 06/11/2020  ? Allergic rhinitis 06/11/2020  ? Anxiety 06/11/2020  ? Asthma 06/11/2020  ? Benign  intracranial hypertension 06/11/2020  ? Body mass index (BMI) 45.0-49.9, adult ([place]) 06/11/2020  ? Bowel incontinence 06/11/2020  ? Cholelithiasis 06/11/2020  ? Chronic sinusitis 06/11/2020  ? 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  ? Daytime somnolence 06/11/2020  ? Diarrhea of presumed infectious origin 06/11/2020  ? Edema 06/11/2020  ? Family history of malignant neoplasm of gastrointestinal tract 06/11/2020  ? Gout 06/11/2020  ? History of fall 06/11/2020  ? History of infectious disease 06/11/2020  ? Insomnia 06/11/2020  ? Irregular bowel habits 06/11/2020  ? 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 Lumbar spondylosis with myelopathy 06/11/2020  ? Macrocytosis 06/11/2020  ? Mild recurrent major depression ([place]) 06/11/2020  ? Personal history of colonic polyps 06/11/2020  ? Personal history of malignant neoplasm of breast 06/11/2020  ? Repeated falls 06/11/2020  ? Mixed anxiety and depressive disorder 06/11/2020  ? Irritable bowel syndrome 01/04/2020  ? Spinal stenosis of lumbar region 01/03/2020  ? Other symptoms and signs involving the musculoskeletal system 09/11/2019  ? Bilateral leg weakness 08/01/2019  ? Leg swelling 08/01/2019  ? Diabetes mellitus with neuropathy ([place]) 06/15/2019  ? Hypokalemia 11/08/2018  ? CKD (chronic kidney disease) stage 3, GFR 30-59 ml/min (HCC) 11/08/2018  ? Orthostatic hypotension 07/28/2017  ? SVD (spontaneous vaginal delivery)   ? Peripheral neuropathy   ? On home oxygen therapy   ? Migraines   ? Left bundle branch block   ? Hypothyroidism   ? Hypertension   ? Hyperlipidemia   ? Heart murmur   ? GERD (gastroesophageal reflux disease)   ? Exertional shortness of breath   ?  Major depressive disorder   ? Back pain   ? Arthritis   ? Generalized anxiety disorder   ? Anemia   ? Chronic respiratory failure ([place]) 09/14/2013  ? 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 Morbid obesity ([place]) 11/01/2012  ? Chronic systolic heart failure ([place]) 10/27/2012  ? Endometrial polyp  01/20/2012  ? Malignant tumor of breast ([place]) 03/26/2011  ? Unspecified vitamin D deficiency 03/26/2011  ? ? ?Current Outpatient Medications:  ?  acetaZOLAMIDE (DIAMOX) 125 MG tablet, Take 1 tablet (125 mg total) by mouth daily., Disp: 90 tablet, Rfl: 3 ?  carvedilol (COREG) 3.125 MG tablet, TAKE 1 TABLET (3.125 MG) BY MOUTH TWICE DAILY WITH MEALS, Disp: 180 tablet, Rfl: 3 ?  citalopram (CELEXA) 40 MG tablet, Take 40 mg by mouth daily., Disp: , Rfl:  ?  clonazePAM (KLONOPIN) 0.5 MG tablet, Take 0.5 mg by mouth daily as needed for anxiety., Disp: , Rfl:  ?  clonazePAM (KLONOPIN) 0.5 MG tablet, 1 tablet, Disp: , Rfl:  ?  dicyclomine (BENTYL) 20 MG tablet, Take 20 mg by mouth 3 (three) times daily before meals., Disp: , Rfl:  ?  DULoxetine (CYMBALTA) 60 MG capsule, Take 60 mg by mouth daily., Disp: , Rfl:  ?  fexofenadine (ALLEGRA) 180 MG tablet, Take 180 mg by mouth 2 (two) times daily. (Patient not taking: Reported on 08/26/2021), Disp: , Rfl:  ?  fludrocortisone (FLORINEF) 0.1 MG tablet, Take 1 tablet (100 mcg total) by mouth 2 (two) times daily. (Patient not taking: Reported on 08/19/2021), Disp: 180 tablet, Rfl: 3 ?  fluticasone (FLONASE) 50 MCG/ACT nasal spray, , Disp: , Rfl:  ?  gabapentin (NEURONTIN) 300 MG capsule, Take 1 capsule by mouth 3 (three) times daily., Disp: , Rfl:  ?  gabapentin (NEURONTIN) 800 MG tablet, TAKE 1 TABLET AT BEDTIME AS NEEDED FOR NEUROPATHY (Patient not taking: Reported on 08/26/2021), Disp: 30 tablet, Rfl: 1 ?  ipratropium (ATROVENT) 0.03 % nasal spray, Place 2 sprays into both nostrils 2 (two) times daily., Disp: , Rfl:  ?  levothyroxine (SYNTHROID) 88 MCG tablet, TAKE 1 TABLET(88 MCG) BY MOUTH DAILY BEFORE BREAKFAST, Disp: 90 tablet, Rfl: 3 ?  metolazone (ZAROXOLYN) 2.5 MG tablet, Take 1 tablet by mouth every other Tuesday., Disp: 12 tablet, Rfl: 3 ?  midodrine (PROAMATINE) 10 MG tablet, TAKE 1 TABLET THREE TIMES DAILY WITH MEALS, Disp: 270 tablet, Rfl: 3 ?  mirtazapine (REMERON) 30 MG  tablet, 1 tablet at bedtime. (Patient not taking: Reported on 08/26/2021), Disp: , Rfl:  ?  montelukast (SINGULAIR) 10 MG tablet, Take 10 mg by mouth at bedtime. (Patient not taking: Reported on 05/13/2021), Disp: , Rfl: 1 ?  Multiple Vitamin (MULTIVITAMIN) tablet, Take 1 tablet by mouth daily., Disp: , Rfl:  ?  omeprazole (PRILOSEC) 40 MG capsule, Take 40 mg by mouth daily. (Patient not taking: Reported on 07/01/2021), Disp: , Rfl:  ?  potassium chloride SA (KLOR-CON M) 20 MEQ tablet, Take 3 tablets (60 mEq total) by mouth 3 (three) times daily., Disp: 572 tablet, Rfl: 0 ?  simvastatin (ZOCOR) 10 MG tablet, Take 1 tablet (10 mg total) by mouth every evening., Disp: 30 tablet, Rfl: 11 ?  spironolactone (ALDACTONE) 25 MG tablet, Take 1 tablet (25 mg total) by mouth every evening. Needs appt for further refills, Disp: 90 tablet, Rfl: 0 ?  torsemide (DEMADEX) 20 MG tablet, Take 4 tablets (80 mg total) by mouth every morning AND 3 tablets (60 mg total) every evening.,  Disp: 210 tablet, Rfl: 11 ?Allergies  ?Allergen Reactions  ? Ceftin Anaphylaxis  ?  Face and throat swell   ? Geodon [Ziprasidone Hcl] Hives  ? Lisinopril Other (See Comments)  ?  angioedema  ? Shellfish Allergy Other (See Comments)  ?  Gout exacerbation  ? Shellfish-Derived Products   ?  Other reaction(s): Other  ? Cefuroxime   ?  Other reaction(s): anaphylaxis  ? Sulfacetamide Sodium-Sulfur   ?  Other reaction(s): itch  ? Ziprasidone   ?  Other reaction(s): hives on left arm  ? Allopurinol Nausea Only and Other (See Comments)  ?  weakness  ? Ativan [Lorazepam] Itching  ? Sulfa Antibiotics Itching  ? Ultram [Tramadol Hcl] Itching  ? Ultram [Tramadol] Itching  ? Valium [Diazepam] Other (See Comments)  ?  Patient states that diazepam doesn't relax, it has the opposite effect.  ? ? ? ?Social History  ? ?Socioeconomic History  ? Marital status: Married  ?  Spouse name: Not on file  ? Number of children: 2  ? Years of education: 75  ? Highest education level:  Master's degree (e.g., MA, MS, MEng, MEd, MSW, MBA)  ?Occupational History  ? Occupation: retired  ?Tobacco Use  ? Smoking status: Former  ?  Packs/day: 0.10  ?  Years: 26.00  ?  Pack years: 2.60  ?  Types: Ci

## 2021-09-04 ENCOUNTER — Ambulatory Visit (HOSPITAL_COMMUNITY)
Admission: RE | Admit: 2021-09-04 | Discharge: 2021-09-04 | Disposition: A | Discharge status: Home / Self Care | Payer: Medicare HMO | Source: Ambulatory Visit | Attending: Internal Medicine | Admitting: Internal Medicine

## 2021-09-04 DIAGNOSIS — I5032 Chronic diastolic (congestive) heart failure: Secondary | ICD-10-CM | POA: Diagnosis not present

## 2021-09-04 LAB — BASIC METABOLIC PANEL
Anion gap: 11 (ref 5–15)
BUN: 41 mg/dL — ABNORMAL HIGH (ref 8–23)
CO2: 27 mmol/L (ref 22–32)
Calcium: 9.5 mg/dL (ref 8.9–10.3)
Chloride: 97 mmol/L — ABNORMAL LOW (ref 98–111)
Creatinine, Ser: 1.66 mg/dL — ABNORMAL HIGH (ref 0.44–1.00)
GFR, Estimated: 33 mL/min — ABNORMAL LOW (ref >60)
Glucose, Bld: 161 mg/dL — ABNORMAL HIGH (ref 70–99)
Potassium: 3.7 mmol/L (ref 3.5–5.1)
Sodium: 135 mmol/L (ref 135–145)

## 2021-09-08 ENCOUNTER — Ambulatory Visit (INDEPENDENT_AMBULATORY_CARE_PROVIDER_SITE_OTHER): Payer: Medicare HMO

## 2021-09-08 DIAGNOSIS — I5032 Chronic diastolic (congestive) heart failure: Secondary | ICD-10-CM

## 2021-09-08 DIAGNOSIS — Z95810 Presence of automatic (implantable) cardiac defibrillator: Secondary | ICD-10-CM | POA: Diagnosis not present

## 2021-09-09 ENCOUNTER — Other Ambulatory Visit (HOSPITAL_COMMUNITY): Payer: Self-pay

## 2021-09-09 NOTE — Progress Notes (Signed)
Paramedicine Encounter ? ? ? Patient ID: Rhonda [name], female    DOB: [birth year], 71 y.o.   MRN: [id number] ? ?Arrived for home visit for [name]. She reports feeling depressed more than normal and having little energy. She has been med compliant and has been taking the '30mg'$  of Cymbalta as instructed by her PCP. Dr. [name] wants her to decrease the '30mg'$  to '20mg'$ . I will make Dr. [name] aware of the increased depression with the decrease in Cymbalta.  ? ?Meds were reviewed and confirmed. Pill box filled (including '30mg'$  Cymbalata) for one week.  ? ?Refills: ?Torsemide ?Celexa  ? ?Home visit complete. I will follow up in one week. (THIS WAS A MED REC VISIT ONLY) ? ?ACTION: ?Home visit completed ? ? ? ? ? ? ?

## 2021-09-10 ENCOUNTER — Ambulatory Visit (INDEPENDENT_AMBULATORY_CARE_PROVIDER_SITE_OTHER): Payer: Medicare HMO

## 2021-09-10 DIAGNOSIS — I428 Other cardiomyopathies: Secondary | ICD-10-CM

## 2021-09-10 LAB — CUP PACEART REMOTE DEVICE CHECK
Battery Remaining Longevity: 81 mo
Battery Voltage: 2.99 V
Brady Statistic AP VP Percent: 0.02 %
Brady Statistic AP VS Percent: 0.01 %
Brady Statistic AS VP Percent: 98.92 %
Brady Statistic AS VS Percent: 1.05 %
Brady Statistic RA Percent Paced: 0.03 %
Brady Statistic RV Percent Paced: 90.21 %
Date Time Interrogation Session: 20230419031606
HighPow Impedance: 79 Ohm
Implantable Lead Implant Date: 20141210
Implantable Lead Implant Date: 20141210
Implantable Lead Implant Date: 20141210
Implantable Lead Location: 753858
Implantable Lead Location: 753859
Implantable Lead Location: 753860
Implantable Lead Model: 4396
Implantable Lead Model: 5076
Implantable Lead Model: 6935
Implantable Pulse Generator Implant Date: 20211118
Lead Channel Impedance Value: 361 Ohm
Lead Channel Impedance Value: 399 Ohm
Lead Channel Impedance Value: 456 Ohm
Lead Channel Impedance Value: 532 Ohm
Lead Channel Impedance Value: 608 Ohm
Lead Channel Impedance Value: 665 Ohm
Lead Channel Pacing Threshold Amplitude: 0.5 V
Lead Channel Pacing Threshold Amplitude: 0.75 V
Lead Channel Pacing Threshold Amplitude: 1.125 V
Lead Channel Pacing Threshold Pulse Width: 0.4 ms
Lead Channel Pacing Threshold Pulse Width: 0.4 ms
Lead Channel Pacing Threshold Pulse Width: 0.4 ms
Lead Channel Sensing Intrinsic Amplitude: 28.125 mV
Lead Channel Sensing Intrinsic Amplitude: 28.125 mV
Lead Channel Sensing Intrinsic Amplitude: 3.25 mV
Lead Channel Sensing Intrinsic Amplitude: 3.25 mV
Lead Channel Setting Pacing Amplitude: 1.25 V
Lead Channel Setting Pacing Amplitude: 2 V
Lead Channel Setting Pacing Amplitude: 2.25 V
Lead Channel Setting Pacing Pulse Width: 0.4 ms
Lead Channel Setting Pacing Pulse Width: 0.4 ms
Lead Channel Setting Sensing Sensitivity: 0.3 mV

## 2021-09-10 NOTE — Progress Notes (Signed)
EPIC Encounter for ICM Monitoring ? ?Patient Name: Rhonda [name] is a 71 y.o. female ?Date: 09/10/2021 ?Primary Care Physican: Rhonda Garland, PA-C ?Primary Cardiologist: [employer] ?Electrophysiologist: Rhonda [name] ?Bi-V Pacing: 98.0%   ?07/30/2021  Weight: 230 lbs  ?  ?  ?Transmission reviewed. ?  ?Optivol thoracic impedance suggesting normal fluid levels.  ?  ?Prescribed:  Patient participating in paramedicine program to assist with meds. ?Torsemide 20 mg 4 tablets (80 mg total) by mouth every morning and 3 tablets (60 mg total) every evening.   ?Potassium 20 meq 2 tablets (40 mEq total) by mouth 3 (three) times daily.  Take extra 2 tablets on days you take Metolazone. ?Metolazone 2.'5mg'$  1 tablet every other Tuesday. ?Spironolactone 25 mg take 1 tablet by mouth every evening. ?  ?Labs: ?09/04/2021 Creatinine 1.66, BUN 41, Potassium 3.7, Sodium 135, GFR 33 ?A complete set of results can be found in Results Review. ?  ?Recommendations:  No changes.  ?  ?Follow-up plan: ICM clinic phone appointment on 10/13/2021.   91 day device clinic remote transmission 12/10/2021.   ?  ?EP/Cardiology Office Visits:   12/25/2021 with [facility] Clinic.  Recall 07/26/2021 with Dr Rhonda [name].   ?  ?Copy of ICM check sent to Dr. Lovena [name]. ? ?3 month ICM trend: 09/08/2021. ? ? ? ?12-14 Month ICM trend:  ? ? ? ?Rhonda Billings, RN ?09/10/2021 ?3:21 PM ? ?

## 2021-09-12 NOTE — Telephone Encounter (Signed)
Per Leroy Sea at [facility] patient no showed on 06/11/21 and 06/19/21 for her set up and her ONO. ?

## 2021-09-15 ENCOUNTER — Other Ambulatory Visit (HOSPITAL_COMMUNITY): Payer: Self-pay

## 2021-09-15 NOTE — Progress Notes (Signed)
Paramedicine Encounter ? ? ? Patient ID: Rhonda [name], female    DOB: 01/23/1951, 71 y.o.   MRN: [id number] ? ? ?Arrived for home visit for Rhonda [name] who reports to be feeling okay today. She denied chest pain, shortness of breath, dizziness. She has been taking her medications over the last week.  ? ?She reports she is starting to feel better as far as her depression and anxiety.  ? ? ?She did report that she had a mechanical fall last week and reports no injuries from this. She was ambulating around her home with no trouble or complaints.  ? ?Vitals and assessment obtained. ?No lower leg edema noted. Lungs clear.   ? ?I reviewed medications and confirmed same. Pill box filled for one week.  ? ?Refills: ?Clonazepam ?Cymbalta ? ? ?We reviewed upcoming appointments and discussed starting back with [facility]. She plans to reach out to [facility] for next scheduled program dates.  ? ?Home visit complete. I will see Rhonda [name] in one week.  ? ? ? ?-Metolazone taken today. Next dose 5/8.  ? ? ?Patient Care Team: ?Damaris Schooner as PCP - General (Family Medicine) ?[name], LCSW as Education officer, museum (Licensed Holiday representative) ?Alda Berthold, DO as Consulting Physician (Neurology) ?[name], EMT as Paramedic ?[name], MD as Attending Physician (Family Medicine) ? ?Patient Active Problem List  ? Diagnosis Date Noted  ? Decreased estrogen level 08/01/2021  ? Dementia ([place]) 08/01/2021  ? Frail elderly 08/01/2021  ? Hyperparathyroidism ([place]) 08/01/2021  ? Spondylolisthesis 08/01/2021  ? Varicose veins of bilateral lower extremities with other complications 81/19/1478  ? Lumbago without sciatica 04/30/2021  ? Abnormal gait 06/11/2020  ? Adult failure to thrive syndrome 06/11/2020  ? Allergic rhinitis 06/11/2020  ? Anxiety 06/11/2020  ? Asthma 06/11/2020  ? Benign intracranial hypertension 06/11/2020  ? Body mass index (BMI) 45.0-49.9, adult ([place]) 06/11/2020  ? Bowel  incontinence 06/11/2020  ? Cholelithiasis 06/11/2020  ? Chronic sinusitis 06/11/2020  ? 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  ? Daytime somnolence 06/11/2020  ? Diarrhea of presumed infectious origin 06/11/2020  ? Edema 06/11/2020  ? Family history of malignant neoplasm of gastrointestinal tract 06/11/2020  ? Gout 06/11/2020  ? History of fall 06/11/2020  ? History of infectious disease 06/11/2020  ? Insomnia 06/11/2020  ? Irregular bowel habits 06/11/2020  ? 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 Lumbar spondylosis with myelopathy 06/11/2020  ? Macrocytosis 06/11/2020  ? Mild recurrent major depression ([place]) 06/11/2020  ? Personal history of colonic polyps 06/11/2020  ? Personal history of malignant neoplasm of breast 06/11/2020  ? Repeated falls 06/11/2020  ? Mixed anxiety and depressive disorder 06/11/2020  ? Irritable bowel syndrome 01/04/2020  ? Spinal stenosis of lumbar region 01/03/2020  ? Other symptoms and signs involving the musculoskeletal system 09/11/2019  ? Bilateral leg weakness 08/01/2019  ? Leg swelling 08/01/2019  ? Diabetes mellitus with neuropathy ([place]) 06/15/2019  ? Hypokalemia 11/08/2018  ? CKD (chronic kidney disease) stage 3, GFR 30-59 ml/min (HCC) 11/08/2018  ? Orthostatic hypotension 07/28/2017  ? SVD (spontaneous vaginal delivery)   ? Peripheral neuropathy   ? On home oxygen therapy   ? Migraines   ? Left bundle branch block   ? Hypothyroidism   ? Hypertension   ? Hyperlipidemia   ? Heart murmur   ? GERD (gastroesophageal reflux disease)   ? Exertional shortness of breath   ? Major depressive disorder   ? Back pain   ?  Arthritis   ? Generalized anxiety disorder   ? Anemia   ? Chronic respiratory failure ([place]) 09/14/2013  ? 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 Morbid obesity ([place]) 11/01/2012  ? Chronic systolic heart failure ([place]) 10/27/2012  ? Endometrial polyp 01/20/2012  ? Malignant tumor of breast ([place]) 03/26/2011  ? Unspecified vitamin D deficiency 03/26/2011   ? ? ?Current Outpatient Medications:  ?  acetaZOLAMIDE (DIAMOX) 125 MG tablet, Take 1 tablet (125 mg total) by mouth daily., Disp: 90 tablet, Rfl: 3 ?  carvedilol (COREG) 3.125 MG tablet, TAKE 1 TABLET (3.125 MG) BY MOUTH TWICE DAILY WITH MEALS, Disp: 180 tablet, Rfl: 3 ?  citalopram (CELEXA) 40 MG tablet, Take 40 mg by mouth daily., Disp: , Rfl:  ?  clonazePAM (KLONOPIN) 0.5 MG tablet, Take 0.5 mg by mouth daily as needed for anxiety., Disp: , Rfl:  ?  clonazePAM (KLONOPIN) 0.5 MG tablet, 1 tablet, Disp: , Rfl:  ?  dicyclomine (BENTYL) 20 MG tablet, Take 20 mg by mouth 3 (three) times daily before meals., Disp: , Rfl:  ?  DULoxetine (CYMBALTA) 60 MG capsule, Take 60 mg by mouth daily., Disp: , Rfl:  ?  fexofenadine (ALLEGRA) 180 MG tablet, Take 180 mg by mouth 2 (two) times daily. (Patient not taking: Reported on 08/26/2021), Disp: , Rfl:  ?  fludrocortisone (FLORINEF) 0.1 MG tablet, Take 1 tablet (100 mcg total) by mouth 2 (two) times daily. (Patient not taking: Reported on 08/19/2021), Disp: 180 tablet, Rfl: 3 ?  fluticasone (FLONASE) 50 MCG/ACT nasal spray, , Disp: , Rfl:  ?  gabapentin (NEURONTIN) 300 MG capsule, Take 1 capsule by mouth 3 (three) times daily., Disp: , Rfl:  ?  gabapentin (NEURONTIN) 800 MG tablet, TAKE 1 TABLET AT BEDTIME AS NEEDED FOR NEUROPATHY (Patient not taking: Reported on 08/26/2021), Disp: 30 tablet, Rfl: 1 ?  ipratropium (ATROVENT) 0.03 % nasal spray, Place 2 sprays into both nostrils 2 (two) times daily., Disp: , Rfl:  ?  levothyroxine (SYNTHROID) 88 MCG tablet, TAKE 1 TABLET(88 MCG) BY MOUTH DAILY BEFORE BREAKFAST, Disp: 90 tablet, Rfl: 3 ?  metolazone (ZAROXOLYN) 2.5 MG tablet, Take 1 tablet by mouth every other Tuesday., Disp: 12 tablet, Rfl: 3 ?  midodrine (PROAMATINE) 10 MG tablet, TAKE 1 TABLET THREE TIMES DAILY WITH MEALS, Disp: 270 tablet, Rfl: 3 ?  mirtazapine (REMERON) 30 MG tablet, 1 tablet at bedtime. (Patient not taking: Reported on 08/26/2021), Disp: , Rfl:  ?  montelukast  (SINGULAIR) 10 MG tablet, Take 10 mg by mouth at bedtime. (Patient not taking: Reported on 05/13/2021), Disp: , Rfl: 1 ?  Multiple Vitamin (MULTIVITAMIN) tablet, Take 1 tablet by mouth daily., Disp: , Rfl:  ?  omeprazole (PRILOSEC) 40 MG capsule, Take 40 mg by mouth daily. (Patient not taking: Reported on 07/01/2021), Disp: , Rfl:  ?  potassium chloride SA (KLOR-CON M) 20 MEQ tablet, Take 3 tablets (60 mEq total) by mouth 3 (three) times daily., Disp: 572 tablet, Rfl: 0 ?  simvastatin (ZOCOR) 10 MG tablet, Take 1 tablet (10 mg total) by mouth every evening., Disp: 30 tablet, Rfl: 11 ?  spironolactone (ALDACTONE) 25 MG tablet, Take 1 tablet (25 mg total) by mouth every evening. Needs appt for further refills, Disp: 90 tablet, Rfl: 0 ?  torsemide (DEMADEX) 20 MG tablet, Take 4 tablets (80 mg total) by mouth every morning AND 3 tablets (60 mg total) every evening., Disp: 210 tablet, Rfl: 11 ?Allergies  ?Allergen Reactions  ?  Ceftin Anaphylaxis  ?  Face and throat swell   ? Geodon [Ziprasidone Hcl] Hives  ? Lisinopril Other (See Comments)  ?  angioedema  ? Shellfish Allergy Other (See Comments)  ?  Gout exacerbation  ? Shellfish-Derived Products   ?  Other reaction(s): Other  ? Cefuroxime   ?  Other reaction(s): anaphylaxis  ? Sulfacetamide Sodium-Sulfur   ?  Other reaction(s): itch  ? Ziprasidone   ?  Other reaction(s): hives on left arm  ? Allopurinol Nausea Only and Other (See Comments)  ?  weakness  ? Ativan [Lorazepam] Itching  ? Sulfa Antibiotics Itching  ? Ultram [Tramadol Hcl] Itching  ? Ultram [Tramadol] Itching  ? Valium [Diazepam] Other (See Comments)  ?  Patient states that diazepam doesn't relax, it has the opposite effect.  ? ? ? ?Social History  ? ?Socioeconomic History  ? Marital status: Married  ?  Spouse name: Not on file  ? Number of children: 2  ? Years of education: 69  ? Highest education level: Master's degree (e.g., MA, MS, MEng, MEd, MSW, MBA)  ?Occupational History  ? Occupation: retired   ?Tobacco Use  ? Smoking status: Former  ?  Packs/day: 0.10  ?  Years: 26.00  ?  Pack years: 2.60  ?  Types: Cigarettes  ?  Quit date: 03/23/2021  ?  Years since quitting: 0.4  ? Smokeless tobacco: Never  ?Vaping Use  ?

## 2021-09-17 DIAGNOSIS — E039 Hypothyroidism, unspecified: Secondary | ICD-10-CM | POA: Diagnosis not present

## 2021-09-17 DIAGNOSIS — I10 Essential (primary) hypertension: Secondary | ICD-10-CM | POA: Diagnosis not present

## 2021-09-17 DIAGNOSIS — E785 Hyperlipidemia, unspecified: Secondary | ICD-10-CM | POA: Diagnosis not present

## 2021-09-17 DIAGNOSIS — F330 Major depressive disorder, recurrent, mild: Secondary | ICD-10-CM | POA: Diagnosis not present

## 2021-09-17 DIAGNOSIS — E1140 Type 2 diabetes mellitus with diabetic neuropathy, unspecified: Secondary | ICD-10-CM | POA: Diagnosis not present

## 2021-09-17 DIAGNOSIS — I5022 Chronic systolic (congestive) heart failure: Secondary | ICD-10-CM | POA: Diagnosis not present

## 2021-09-17 DIAGNOSIS — N1832 Chronic kidney disease, stage 3b: Secondary | ICD-10-CM | POA: Diagnosis not present

## 2021-09-22 ENCOUNTER — Other Ambulatory Visit (HOSPITAL_COMMUNITY): Payer: Self-pay

## 2021-09-22 NOTE — Progress Notes (Signed)
Paramedicine Encounter ? ? ? Patient ID: ALAISA MOFFITT, female    DOB: 30-Aug-1950, 71 y.o.   MRN: [id number] ? ?Arrived for home visit with Mrs. Dileo who reports feeling okay today. She denied chest pain, shortness of breath, dizziness or leg swelling. I obtained vitals and assessment. Weight is down 3 lbs this week. No lower leg edema noted. Lungs clear.  ? ?I reviewed medications and filled pill box for one week. She started '20mg'$  Cymbalta today as instructed by PCP Dr. [name] at [facility] [place].  ? ?Refills needed: ?Clonazepam ?(She will be without same [facility], [facility] reports they cannot fill until 5/6) ? ?Gabapentin  ? ?We reviewed upcoming appointments, fluid restrictions, diet suggestions and med compliance importance. She agreed with plans and home visit complete.  ? ? ? ? ?Patient Care Team: ?Damaris Schooner as PCP - General (Family Medicine) ?[name], LCSW as Education officer, museum (Licensed [employer] representative) ?Alda Berthold, DO as Consulting Physician (Neurology) ?[name], EMT as Paramedic ?[name], MD as Attending Physician (Family Medicine) ? ?Patient Active Problem List  ? Diagnosis Date Noted  ? Decreased estrogen level 08/01/2021  ? Dementia ([place]) 08/01/2021  ? Frail elderly 08/01/2021  ? Hyperparathyroidism ([place]) 08/01/2021  ? Spondylolisthesis 08/01/2021  ? Varicose veins of bilateral lower extremities with other complications 25/42/7062  ? Lumbago without sciatica 04/30/2021  ? Abnormal gait 06/11/2020  ? Adult failure to thrive syndrome 06/11/2020  ? Allergic rhinitis 06/11/2020  ? Anxiety 06/11/2020  ? Asthma 06/11/2020  ? Benign intracranial hypertension 06/11/2020  ? Body mass index (BMI) 45.0-49.9, adult ([place]) 06/11/2020  ? Bowel incontinence 06/11/2020  ? Cholelithiasis 06/11/2020  ? Chronic sinusitis 06/11/2020  ? 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  ? Daytime somnolence 06/11/2020  ? Diarrhea of presumed infectious origin 06/11/2020  ? Edema  06/11/2020  ? Family history of malignant neoplasm of gastrointestinal tract 06/11/2020  ? Gout 06/11/2020  ? History of fall 06/11/2020  ? History of infectious disease 06/11/2020  ? Insomnia 06/11/2020  ? Irregular bowel habits 06/11/2020  ? 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 Lumbar spondylosis with myelopathy 06/11/2020  ? Macrocytosis 06/11/2020  ? Mild recurrent major depression ([place]) 06/11/2020  ? Personal history of colonic polyps 06/11/2020  ? Personal history of malignant neoplasm of breast 06/11/2020  ? Repeated falls 06/11/2020  ? Mixed anxiety and depressive disorder 06/11/2020  ? Irritable bowel syndrome 01/04/2020  ? Spinal stenosis of lumbar region 01/03/2020  ? Other symptoms and signs involving the musculoskeletal system 09/11/2019  ? Bilateral leg weakness 08/01/2019  ? Leg swelling 08/01/2019  ? Diabetes mellitus with neuropathy ([place]) 06/15/2019  ? Hypokalemia 11/08/2018  ? CKD (chronic kidney disease) stage 3, GFR 30-59 ml/min (HCC) 11/08/2018  ? Orthostatic hypotension 07/28/2017  ? SVD (spontaneous vaginal delivery)   ? Peripheral neuropathy   ? On home oxygen therapy   ? Migraines   ? Left bundle branch block   ? Hypothyroidism   ? Hypertension   ? Hyperlipidemia   ? Heart murmur   ? GERD (gastroesophageal reflux disease)   ? Exertional shortness of breath   ? Major depressive disorder   ? Back pain   ? Arthritis   ? Generalized anxiety disorder   ? Anemia   ? Chronic respiratory failure ([place]) 09/14/2013  ? 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 Morbid obesity ([place]) 11/01/2012  ? Chronic systolic heart failure ([place]) 10/27/2012  ? Endometrial polyp 01/20/2012  ? Malignant  tumor of breast ([place]) 03/26/2011  ? Unspecified vitamin D deficiency 03/26/2011  ? ? ?Current Outpatient Medications:  ?  acetaZOLAMIDE (DIAMOX) 125 MG tablet, Take 1 tablet (125 mg total) by mouth daily., Disp: 90 tablet, Rfl: 3 ?  carvedilol (COREG) 3.125 MG tablet, TAKE 1 TABLET  (3.125 MG) BY MOUTH TWICE DAILY WITH MEALS, Disp: 180 tablet, Rfl: 3 ?  citalopram (CELEXA) 40 MG tablet, Take 40 mg by mouth daily., Disp: , Rfl:  ?  clonazePAM (KLONOPIN) 0.5 MG tablet, Take 0.5 mg by mouth daily as needed for anxiety., Disp: , Rfl:  ?  clonazePAM (KLONOPIN) 0.5 MG tablet, 1 tablet, Disp: , Rfl:  ?  dicyclomine (BENTYL) 20 MG tablet, Take 20 mg by mouth 3 (three) times daily before meals., Disp: , Rfl:  ?  DULoxetine (CYMBALTA) 60 MG capsule, Take 60 mg by mouth daily., Disp: , Rfl:  ?  fexofenadine (ALLEGRA) 180 MG tablet, Take 180 mg by mouth 2 (two) times daily. (Patient not taking: Reported on 08/26/2021), Disp: , Rfl:  ?  fludrocortisone (FLORINEF) 0.1 MG tablet, Take 1 tablet (100 mcg total) by mouth 2 (two) times daily. (Patient not taking: Reported on 08/19/2021), Disp: 180 tablet, Rfl: 3 ?  fluticasone (FLONASE) 50 MCG/ACT nasal spray, , Disp: , Rfl:  ?  gabapentin (NEURONTIN) 300 MG capsule, Take 1 capsule by mouth 3 (three) times daily., Disp: , Rfl:  ?  gabapentin (NEURONTIN) 800 MG tablet, TAKE 1 TABLET AT BEDTIME AS NEEDED FOR NEUROPATHY (Patient not taking: Reported on 08/26/2021), Disp: 30 tablet, Rfl: 1 ?  ipratropium (ATROVENT) 0.03 % nasal spray, Place 2 sprays into both nostrils 2 (two) times daily., Disp: , Rfl:  ?  levothyroxine (SYNTHROID) 88 MCG tablet, TAKE 1 TABLET(88 MCG) BY MOUTH DAILY BEFORE BREAKFAST, Disp: 90 tablet, Rfl: 3 ?  metolazone (ZAROXOLYN) 2.5 MG tablet, Take 1 tablet by mouth every other Tuesday., Disp: 12 tablet, Rfl: 3 ?  midodrine (PROAMATINE) 10 MG tablet, TAKE 1 TABLET THREE TIMES DAILY WITH MEALS, Disp: 270 tablet, Rfl: 3 ?  mirtazapine (REMERON) 30 MG tablet, 1 tablet at bedtime. (Patient not taking: Reported on 08/26/2021), Disp: , Rfl:  ?  montelukast (SINGULAIR) 10 MG tablet, Take 10 mg by mouth at bedtime. (Patient not taking: Reported on 05/13/2021), Disp: , Rfl: 1 ?  Multiple Vitamin (MULTIVITAMIN) tablet, Take 1 tablet by mouth daily., Disp: , Rfl:   ?  omeprazole (PRILOSEC) 40 MG capsule, Take 40 mg by mouth daily. (Patient not taking: Reported on 07/01/2021), Disp: , Rfl:  ?  potassium chloride SA (KLOR-CON M) 20 MEQ tablet, Take 3 tablets (60 mEq total) by mouth 3 (three) times daily., Disp: 572 tablet, Rfl: 0 ?  simvastatin (ZOCOR) 10 MG tablet, Take 1 tablet (10 mg total) by mouth every evening., Disp: 30 tablet, Rfl: 11 ?  spironolactone (ALDACTONE) 25 MG tablet, Take 1 tablet (25 mg total) by mouth every evening. Needs appt for further refills, Disp: 90 tablet, Rfl: 0 ?  torsemide (DEMADEX) 20 MG tablet, Take 4 tablets (80 mg total) by mouth every morning AND 3 tablets (60 mg total) every evening., Disp: 210 tablet, Rfl: 11 ?Allergies  ?Allergen Reactions  ? Ceftin Anaphylaxis  ?  Face and throat swell   ? Geodon [Ziprasidone Hcl] Hives  ? Lisinopril Other (See Comments)  ?  angioedema  ? Shellfish Allergy Other (See Comments)  ?  Gout exacerbation  ? Shellfish-Derived Products   ?  Other reaction(s): Other  ?  Cefuroxime   ?  Other reaction(s): anaphylaxis  ? Sulfacetamide Sodium-Sulfur   ?  Other reaction(s): itch  ? Ziprasidone   ?  Other reaction(s): hives on left arm  ? Allopurinol Nausea Only and Other (See Comments)  ?  weakness  ? Ativan [Lorazepam] Itching  ? Sulfa Antibiotics Itching  ? Ultram [Tramadol Hcl] Itching  ? Ultram [Tramadol] Itching  ? Valium [Diazepam] Other (See Comments)  ?  Patient states that diazepam doesn't relax, it has the opposite effect.  ? ? ? ?Social History  ? ?Socioeconomic History  ? Marital status: Married  ?  Spouse name: Not on file  ? Number of children: 2  ? Years of education: 56  ? Highest education level: Master's degree (e.g., MA, MS, MEng, MEd, MSW, MBA)  ?Occupational History  ? Occupation: retired  ?Tobacco Use  ? Smoking status: Former  ?  Packs/day: 0.10  ?  Years: 26.00  ?  Pack years: 2.60  ?  Types: Cigarettes  ?  Quit date: 03/23/2021  ?  Years since quitting: 0.5  ? Smokeless tobacco: Never  ?Vaping  Use  ? Vaping Use: Never used  ?Substance and Sexual Activity  ? Alcohol use: No  ? Drug use: No  ? Sexual activity: Yes  ?Other Topics Concern  ? Not on file  ?Social History Narrative  ? Tobacco Use Cigarettes: F

## 2021-09-24 DIAGNOSIS — I700 Atherosclerosis of aorta: Secondary | ICD-10-CM | POA: Diagnosis not present

## 2021-09-24 DIAGNOSIS — F418 Other specified anxiety disorders: Secondary | ICD-10-CM | POA: Diagnosis not present

## 2021-09-24 DIAGNOSIS — D7589 Other specified diseases of blood and blood-forming organs: Secondary | ICD-10-CM | POA: Diagnosis not present

## 2021-09-24 DIAGNOSIS — I5022 Chronic systolic (congestive) heart failure: Secondary | ICD-10-CM | POA: Diagnosis not present

## 2021-09-24 DIAGNOSIS — N1832 Chronic kidney disease, stage 3b: Secondary | ICD-10-CM | POA: Diagnosis not present

## 2021-09-26 NOTE — Progress Notes (Signed)
Remote ICD transmission.   

## 2021-09-29 ENCOUNTER — Other Ambulatory Visit (HOSPITAL_COMMUNITY): Payer: Self-pay

## 2021-09-29 NOTE — Progress Notes (Signed)
Paramedicine Encounter ? ? ? Patient ID: Rhonda [name], female    DOB: 22-Jan-1951, 71 y.o.   MRN: [id number] ? ?Arrived for home visit for Rhonda [name] who reports feeling okay today. She denied shortness of breath, chest pain or dizziness. She has been mostly compliant with her meds. She missed one morning dose, two noon doses over the last week. I reviewed all meds and confirmed same filling pill box for one week and giving her, her medications for today.  ? ?No lower leg edema noted. Lungs clear.  ?Vitals as noted and within normal limits.  ? ?No weight gain over the last week.  ? ?We reviewed upcoming appointments and discussed pharmacies that deliver. She is wanting her medications transferred from [facility] to [facility]. I will work on this.  ? ?I reached out to PCP office to speak to RN and left a message for her to return my call.  ? ?I plan to see Rhonda [name] in one week.  ? ?Home Visit complete.  ? ? ? ?Patient Care Team: ?Damaris Schooner as PCP - General (Family Medicine) ?[name], LCSW as Education officer, museum (Licensed [employer] representative) ?Alda Berthold, DO as Consulting Physician (Neurology) ?[name], EMT as Paramedic ?[name], MD as Attending Physician (Family Medicine) ? ?Patient Active Problem List  ? Diagnosis Date Noted  ? Decreased estrogen level 08/01/2021  ? Dementia ([place]) 08/01/2021  ? Frail elderly 08/01/2021  ? Hyperparathyroidism ([place]) 08/01/2021  ? Spondylolisthesis 08/01/2021  ? Varicose veins of bilateral lower extremities with other complications 73/41/9379  ? Lumbago without sciatica 04/30/2021  ? Abnormal gait 06/11/2020  ? Adult failure to thrive syndrome 06/11/2020  ? Allergic rhinitis 06/11/2020  ? Anxiety 06/11/2020  ? Asthma 06/11/2020  ? Benign intracranial hypertension 06/11/2020  ? Body mass index (BMI) 45.0-49.9, adult ([place]) 06/11/2020  ? Bowel incontinence 06/11/2020  ? Cholelithiasis 06/11/2020  ? Chronic sinusitis 06/11/2020  ?  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  ? Daytime somnolence 06/11/2020  ? Diarrhea of presumed infectious origin 06/11/2020  ? Edema 06/11/2020  ? Family history of malignant neoplasm of gastrointestinal tract 06/11/2020  ? Gout 06/11/2020  ? History of fall 06/11/2020  ? History of infectious disease 06/11/2020  ? Insomnia 06/11/2020  ? Irregular bowel habits 06/11/2020  ? 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 Lumbar spondylosis with myelopathy 06/11/2020  ? Macrocytosis 06/11/2020  ? Mild recurrent major depression ([place]) 06/11/2020  ? Personal history of colonic polyps 06/11/2020  ? Personal history of malignant neoplasm of breast 06/11/2020  ? Repeated falls 06/11/2020  ? Mixed anxiety and depressive disorder 06/11/2020  ? Irritable bowel syndrome 01/04/2020  ? Spinal stenosis of lumbar region 01/03/2020  ? Other symptoms and signs involving the musculoskeletal system 09/11/2019  ? Bilateral leg weakness 08/01/2019  ? Leg swelling 08/01/2019  ? Diabetes mellitus with neuropathy ([place]) 06/15/2019  ? Hypokalemia 11/08/2018  ? CKD (chronic kidney disease) stage 3, GFR 30-59 ml/min (HCC) 11/08/2018  ? Orthostatic hypotension 07/28/2017  ? SVD (spontaneous vaginal delivery)   ? Peripheral neuropathy   ? On home oxygen therapy   ? Migraines   ? Left bundle branch block   ? Hypothyroidism   ? Hypertension   ? Hyperlipidemia   ? Heart murmur   ? GERD (gastroesophageal reflux disease)   ? Exertional shortness of breath   ? Major depressive disorder   ? Back pain   ? Arthritis   ? Generalized anxiety disorder   ? Anemia   ?  Chronic respiratory failure ([place]) 09/14/2013  ? 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 Morbid obesity ([place]) 11/01/2012  ? Chronic systolic heart failure ([place]) 10/27/2012  ? Endometrial polyp 01/20/2012  ? Malignant tumor of breast ([place]) 03/26/2011  ? Unspecified vitamin D deficiency 03/26/2011  ? ? ?Current Outpatient Medications:  ?  acetaZOLAMIDE (DIAMOX) 125 MG tablet, Take 1  tablet (125 mg total) by mouth daily., Disp: 90 tablet, Rfl: 3 ?  carvedilol (COREG) 3.125 MG tablet, TAKE 1 TABLET (3.125 MG) BY MOUTH TWICE DAILY WITH MEALS, Disp: 180 tablet, Rfl: 3 ?  citalopram (CELEXA) 40 MG tablet, Take 40 mg by mouth daily., Disp: , Rfl:  ?  clonazePAM (KLONOPIN) 0.5 MG tablet, Take 0.5 mg by mouth daily as needed for anxiety., Disp: , Rfl:  ?  clonazePAM (KLONOPIN) 0.5 MG tablet, 1 tablet, Disp: , Rfl:  ?  dicyclomine (BENTYL) 20 MG tablet, Take 20 mg by mouth 3 (three) times daily before meals., Disp: , Rfl:  ?  DULoxetine (CYMBALTA) 60 MG capsule, Take 60 mg by mouth daily., Disp: , Rfl:  ?  fexofenadine (ALLEGRA) 180 MG tablet, Take 180 mg by mouth 2 (two) times daily. (Patient not taking: Reported on 08/26/2021), Disp: , Rfl:  ?  fludrocortisone (FLORINEF) 0.1 MG tablet, Take 1 tablet (100 mcg total) by mouth 2 (two) times daily. (Patient not taking: Reported on 08/19/2021), Disp: 180 tablet, Rfl: 3 ?  fluticasone (FLONASE) 50 MCG/ACT nasal spray, , Disp: , Rfl:  ?  gabapentin (NEURONTIN) 300 MG capsule, Take 1 capsule by mouth 3 (three) times daily., Disp: , Rfl:  ?  gabapentin (NEURONTIN) 800 MG tablet, TAKE 1 TABLET AT BEDTIME AS NEEDED FOR NEUROPATHY (Patient not taking: Reported on 08/26/2021), Disp: 30 tablet, Rfl: 1 ?  ipratropium (ATROVENT) 0.03 % nasal spray, Place 2 sprays into both nostrils 2 (two) times daily., Disp: , Rfl:  ?  levothyroxine (SYNTHROID) 88 MCG tablet, TAKE 1 TABLET(88 MCG) BY MOUTH DAILY BEFORE BREAKFAST, Disp: 90 tablet, Rfl: 3 ?  metolazone (ZAROXOLYN) 2.5 MG tablet, Take 1 tablet by mouth every other Tuesday., Disp: 12 tablet, Rfl: 3 ?  midodrine (PROAMATINE) 10 MG tablet, TAKE 1 TABLET THREE TIMES DAILY WITH MEALS, Disp: 270 tablet, Rfl: 3 ?  mirtazapine (REMERON) 30 MG tablet, 1 tablet at bedtime. (Patient not taking: Reported on 08/26/2021), Disp: , Rfl:  ?  montelukast (SINGULAIR) 10 MG tablet, Take 10 mg by mouth at bedtime. (Patient not taking: Reported on  05/13/2021), Disp: , Rfl: 1 ?  Multiple Vitamin (MULTIVITAMIN) tablet, Take 1 tablet by mouth daily., Disp: , Rfl:  ?  omeprazole (PRILOSEC) 40 MG capsule, Take 40 mg by mouth daily. (Patient not taking: Reported on 07/01/2021), Disp: , Rfl:  ?  potassium chloride SA (KLOR-CON M) 20 MEQ tablet, Take 3 tablets (60 mEq total) by mouth 3 (three) times daily., Disp: 572 tablet, Rfl: 0 ?  simvastatin (ZOCOR) 10 MG tablet, Take 1 tablet (10 mg total) by mouth every evening., Disp: 30 tablet, Rfl: 11 ?  spironolactone (ALDACTONE) 25 MG tablet, Take 1 tablet (25 mg total) by mouth every evening. Needs appt for further refills, Disp: 90 tablet, Rfl: 0 ?  torsemide (DEMADEX) 20 MG tablet, Take 4 tablets (80 mg total) by mouth every morning AND 3 tablets (60 mg total) every evening., Disp: 210 tablet, Rfl: 11 ?Allergies  ?Allergen Reactions  ? Ceftin Anaphylaxis  ?  Face and throat swell   ? Geodon [Ziprasidone  Hcl] Hives  ? Lisinopril Other (See Comments)  ?  angioedema  ? Shellfish Allergy Other (See Comments)  ?  Gout exacerbation  ? Shellfish-Derived Products   ?  Other reaction(s): Other  ? Cefuroxime   ?  Other reaction(s): anaphylaxis  ? Sulfacetamide Sodium-Sulfur   ?  Other reaction(s): itch  ? Ziprasidone   ?  Other reaction(s): hives on left arm  ? Allopurinol Nausea Only and Other (See Comments)  ?  weakness  ? Ativan [Lorazepam] Itching  ? Sulfa Antibiotics Itching  ? Ultram [Tramadol Hcl] Itching  ? Ultram [Tramadol] Itching  ? Valium [Diazepam] Other (See Comments)  ?  Patient states that diazepam doesn't relax, it has the opposite effect.  ? ? ? ?Social History  ? ?Socioeconomic History  ? Marital status: Married  ?  Spouse name: Not on file  ? Number of children: 2  ? Years of education: 32  ? Highest education level: Master's degree (e.g., MA, MS, MEng, MEd, MSW, MBA)  ?Occupational History  ? Occupation: retired  ?Tobacco Use  ? Smoking status: Former  ?  Packs/day: 0.10  ?  Years: 26.00  ?  Pack years: 2.60   ?  Types: Cigarettes  ?  Quit date: 03/23/2021  ?  Years since quitting: 0.5  ? Smokeless tobacco: Never  ?Vaping Use  ? Vaping Use: Never used  ?Substance and Sexual Activity  ? Alcohol use: No  ? Dr

## 2021-10-01 ENCOUNTER — Telehealth (HOSPITAL_COMMUNITY): Payer: Self-pay

## 2021-10-01 ENCOUNTER — Other Ambulatory Visit (HOSPITAL_COMMUNITY): Payer: Self-pay | Admitting: *Deleted

## 2021-10-01 MED ORDER — METOLAZONE 2.5 MG PO TABS: ORAL_TABLET | ORAL | 3 refills | Status: DC

## 2021-10-01 MED ORDER — TORSEMIDE 20 MG PO TABS: ORAL_TABLET | ORAL | 11 refills | Status: DC

## 2021-10-01 MED ORDER — SIMVASTATIN 10 MG PO TABS: 10.0000 mg | ORAL_TABLET | Freq: Every evening | ORAL | 11 refills | Status: DC

## 2021-10-01 MED ORDER — POTASSIUM CHLORIDE CRYS ER 20 MEQ PO TBCR
60.0000 meq | EXTENDED_RELEASE_TABLET | Freq: Three times a day (TID) | ORAL | 0 refills | Status: DC
Start: 2021-10-01 — End: 2022-01-14

## 2021-10-01 MED ORDER — CARVEDILOL 3.125 MG PO TABS: ORAL_TABLET | ORAL | 3 refills | Status: DC

## 2021-10-01 MED ORDER — MIDODRINE HCL 10 MG PO TABS: ORAL_TABLET | ORAL | 3 refills | Status: DC

## 2021-10-01 MED ORDER — SPIRONOLACTONE 25 MG PO TABS: 25.0000 mg | ORAL_TABLET | Freq: Every evening | ORAL | 0 refills | Status: DC

## 2021-10-01 NOTE — Telephone Encounter (Signed)
Spoke to [place] who is Dr. [name] (PCP) Nurse for Rhonda [name] to confirm medications and have them send over her prescriptions to [facility]. I will have HF meds sent over today as well.  ?

## 2021-10-02 ENCOUNTER — Other Ambulatory Visit: Payer: Medicare HMO | Admitting: Internal Medicine

## 2021-10-02 DIAGNOSIS — R419 Unspecified symptoms and signs involving cognitive functions and awareness: Secondary | ICD-10-CM | POA: Diagnosis not present

## 2021-10-02 DIAGNOSIS — F339 Major depressive disorder, recurrent, unspecified: Secondary | ICD-10-CM

## 2021-10-02 DIAGNOSIS — I5020 Unspecified systolic (congestive) heart failure: Secondary | ICD-10-CM

## 2021-10-02 DIAGNOSIS — Z515 Encounter for palliative care: Secondary | ICD-10-CM

## 2021-10-02 NOTE — Progress Notes (Signed)
[facility] Follow-Up Visit Telephone: [phone]  Fax: [phone]   Date of encounter: 10/02/21 7:19 AM PATIENT NAME: Rhonda [name] [place] [address]   [phone] (home)  DOB: 1950-09-04 MRN: [id number] PRIMARY CARE PROVIDER:    Damaris Schooner,  [address] [place] [address] [phone]  REFERRING PROVIDER:   Damaris Schooner [place],  [place] [address] [phone]  RESPONSIBLE PARTY:    Contact Information     Name Relation Home Work Mobile   Loll,Edward Spouse [phone]   Gwendalyn Ege Daughter   [phone]        I met face to face with patient and family in her home. Palliative Care was asked to follow this patient by consultation request of  Sela Hilding, MD to address advance care planning and complex medical decision making. This is follow-up visit.                                     ASSESSMENT AND PLAN / RECOMMENDATIONS:   Advance Care Planning/Goals of Care: Goals include to maximize quality of life and symptom management. Patient/health care surrogate gave his/her permission to discuss.Our advance care planning conversation included a discussion about:    The value and importance of advance care planning  Experiences with loved ones who have been seriously ill or have died  Exploration of personal, cultural or spiritual beliefs that might influence medical decisions  Exploration of goals of care in the event of a sudden injury or illness  Identification  of a healthcare agent--husband, Ed Review and updating or creation of an  advance directive document . Decision not to resuscitate or to de-escalate disease focused treatments due to poor prognosis. CODE STATUS:  completed MOST today as follows with patient and husband present:  full code, limited additional interventions as she does not want  to be placed on a ventilator, determine use or limitation of abx when infection occurs, IVF trial, no feeding tube.    Symptom Management/Plan: 1. Depression, recurrent ([place]) -current regimen does not seem to be adequately addressing this and pt also not getting any counseling--recommended she see a counselor within the network of her PCP   2. Neurocognitive disorder -no signs of improvement here, but #1 has not been treated differently as far as I can tell -need updated note from new PCP to see correct med list and update epic accordingly--of course, I'll send this to Dr. [name]  3. HFrEF (heart failure with reduced ejection fraction) ([place]) -no signs of volume overload at present, but not maintaining her activity, not going to Y and mostly sedentary, sleeping a lot which will not help her -continues to get meds filled by EMT service, but I'm not sure her list is correct and she was saying she wanted her Rxs from [facility] nearest her rather than [facility] across town  4. Morbid obesity ([place]) -ongoing, not exercising, last wt on file from 5/11 was 211 lbs  5. Palliative care by specialist -MOST completed with her and her husband today -other goals are to get counseling and feel better so she's motivated to do other things, likely also needs meds changed for this  Follow up Palliative Care Visit: Palliative care will continue to follow for complex medical decision making, advance care planning, and clarification of goals. Return 12/02/2021 and  prn.  This visit was coded based on medical decision making (MDM). 25 minutes spent on ACP (MOST)  PPS: 50%  HOSPICE ELIGIBILITY/DIAGNOSIS: TBD  Chief Complaint: Follow-up palliative visit  HISTORY OF PRESENT ILLNESS:  Rhonda [name] is a 71 y.o. year old female  with HFrEF, depression, neurocognitive disorder, morbid obesity, hyperlipidemia, htn, hypothyroidism, cleft palate, among others seen in palliative care f/u.   She had  forgotten our appt (reminder calls are done 3 days ahead).  She was watching tv and I called her to let her know I was outside after she did not answer the doorbell or knocking.  Her husband was sleeping and she had to wake him so we could complete the MOST.  She reports her mood is still not good.    She has been more sob the past two weeks.  She was dyspneic walking around.  Her energy has been low.    She wasn't sure what day it was today.  [name] came and did her meds but that was it.  She's been laying around in the bed sleeping, watching tv.  Not talking on the phone as much.  Not coloring or doing the things she enjoys--just doesn't feel like it.  She's been [place] [facility] [place].  She said she was going to start walking Dorothea Ogle to the end of the driveway and back.    She's going to Dr. [name] as her new primary.  She actually saw her on 5/8 it appears.  Her husband went with her.  He is sleeping after his shift.  -goes into work at [employer].  Pt was not started on any new medications and they're trying to wean her off meds.    She has a colonoscopy coming up which she'd rather not think about.   Her father had colon cancer.  History obtained from review of EMR, discussion with primary team, and interview with family, facility staff/caregiver and/or Ms. Aronov.  I reviewed available labs, medications, imaging, studies and related documents from the EMR.  Records reviewed and summarized above.   ROS General: NAD EYES: denies vision changes, glasses ENMT: denies dysphagia Cardiovascular: denies chest pain, denies DOE Pulmonary: has chronic cough, has increased SOB Abdomen: endorses good appetite, denies constipation, endorses continence of bowel GU: denies dysuria, endorses continence of urine MSK:  denies increased weakness,  no falls reported Skin: denies rashes or wounds Neurological: denies pain, denies insomnia Psych: Endorses depression, sleeping a lot, not  doing much Heme/lymph/immuno: denies bruises, abnormal bleeding  Physical Exam: Current and past weights:  211 lbs  Constitutional: NAD General: obese, not wearing her wig, has had her nails done though EYES: anicteric sclera, lids intact, no discharge, glasses ENMT: intact hearing, oral mucous membranes moist,has special prosthesis with her cleft palate CV: S1S2, RRR, nonpitting LE edema Pulmonary: LCTA, no increased work of breathing, has cough as usual, room air Abdomen: intake 100%, normo-active BS + 4 quadrants, soft and non tender, no ascites GU: deferred MSK: no sarcopenia, moves all extremities, ambulatory Skin: warm and dry, no rashes or wounds on visible skin Neuro:   generalized weakness,  cognitive impairment with poor short-term memory  Psych: non-anxious affect, A and O x 3 Hem/lymph/immuno: no widespread bruising  CURRENT PROBLEM LIST:  Patient Active Problem List   Diagnosis Date Noted   Decreased estrogen level 08/01/2021   Dementia ([place]) 08/01/2021   Frail elderly 08/01/2021   Hyperparathyroidism ([place]) 08/01/2021  Spondylolisthesis 08/01/2021   Varicose veins of bilateral lower extremities with other complications 58/85/0277   Lumbago without sciatica 04/30/2021   Abnormal gait 06/11/2020   Adult failure to thrive syndrome 06/11/2020   Allergic rhinitis 06/11/2020   Anxiety 06/11/2020   Asthma 06/11/2020   Benign intracranial hypertension 06/11/2020   Body mass index (BMI) 45.0-49.9, adult ([place]) 06/11/2020   Bowel incontinence 06/11/2020   Cholelithiasis 06/11/2020   Chronic sinusitis 06/11/2020   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   Daytime somnolence 06/11/2020   Diarrhea of presumed infectious origin 06/11/2020   Edema 06/11/2020   Family history of malignant neoplasm of gastrointestinal tract 06/11/2020   Gout 06/11/2020   History of fall 06/11/2020   History of infectious disease 06/11/2020   Insomnia 06/11/2020   Irregular bowel habits 06/11/2020    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   Lumbar spondylosis with myelopathy 06/11/2020   Macrocytosis 06/11/2020   Mild recurrent major depression ([place]) 06/11/2020   Personal history of colonic polyps 06/11/2020   Personal history of malignant neoplasm of breast 06/11/2020   Repeated falls 06/11/2020   Mixed anxiety and depressive disorder 06/11/2020   Irritable bowel syndrome 01/04/2020   Spinal stenosis of lumbar region 01/03/2020   Other symptoms and signs involving the musculoskeletal system 09/11/2019   Bilateral leg weakness 08/01/2019   Leg swelling 08/01/2019   Diabetes mellitus with neuropathy ([place]) 06/15/2019   Hypokalemia 11/08/2018   CKD (chronic kidney disease) stage 3, GFR 30-59 ml/min (HCC) 11/08/2018   Orthostatic hypotension 07/28/2017   SVD (spontaneous vaginal delivery)    Peripheral neuropathy    On home oxygen therapy    Migraines    Left bundle branch block    Hypothyroidism    Hypertension    Hyperlipidemia    Heart murmur    GERD (gastroesophageal reflux disease)    Exertional shortness of breath    Major depressive disorder    Back pain    Arthritis    Generalized anxiety disorder    Anemia    Chronic respiratory failure ([place]) 09/14/2013   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   Morbid obesity ([place]) 41/28/7867   Chronic systolic heart failure ([place]) 10/27/2012   Endometrial polyp 01/20/2012   Malignant tumor of breast ([place]) 03/26/2011   Unspecified vitamin D deficiency 03/26/2011   PAST MEDICAL HISTORY:  Active Ambulatory Problems    Diagnosis Date Noted   Malignant tumor of breast ([place]) 03/26/2011   Unspecified vitamin D deficiency 03/26/2011   Endometrial polyp 67/20/9470   Chronic systolic heart failure ([place]) 10/27/2012   Morbid obesity ([place]) 11/01/2012   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   Chronic respiratory failure ([place]) 09/14/2013   SVD (spontaneous vaginal  delivery)    Peripheral neuropathy    On home oxygen therapy    Migraines    Left bundle branch block    Hypothyroidism    Hypertension    Hyperlipidemia    Heart murmur    GERD (gastroesophageal reflux disease)    Exertional shortness of breath    Major depressive disorder    Back pain    Arthritis    Generalized anxiety disorder    Anemia    Orthostatic hypotension 07/28/2017   Hypokalemia 11/08/2018   CKD (chronic kidney disease) stage 3, GFR 30-59 ml/min ([place]) 11/08/2018   Diabetes mellitus with neuropathy ([place]) 06/15/2019   Abnormal gait 06/11/2020   Adult failure to thrive syndrome 06/11/2020   Allergic rhinitis 06/11/2020   Anxiety 06/11/2020  Asthma 06/11/2020   Benign intracranial hypertension 06/11/2020   Body mass index (BMI) 45.0-49.9, adult ([place]) 06/11/2020   Bowel incontinence 06/11/2020   Cholelithiasis 06/11/2020   Chronic sinusitis 06/11/2020   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   Daytime somnolence 06/11/2020   Diarrhea of presumed infectious origin 06/11/2020   Edema 06/11/2020   Family history of malignant neoplasm of gastrointestinal tract 06/11/2020   Gout 06/11/2020   History of fall 06/11/2020   History of infectious disease 06/11/2020   Insomnia 06/11/2020   Irregular bowel habits 06/11/2020   Irritable bowel syndrome 01/04/2020   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   Spinal stenosis of lumbar region 01/03/2020   Lumbar spondylosis with myelopathy 06/11/2020   Macrocytosis 06/11/2020   Mild recurrent major depression ([place]) 06/11/2020   Personal history of colonic polyps 06/11/2020   Personal history of malignant neoplasm of breast 06/11/2020   Repeated falls 06/11/2020   Mixed anxiety and depressive disorder 06/11/2020   Other symptoms and signs involving the musculoskeletal system 09/11/2019   Bilateral leg weakness 08/01/2019   Leg swelling 08/01/2019   Lumbago without sciatica 04/30/2021   Decreased estrogen level 08/01/2021   Dementia ([place])  08/01/2021   Frail elderly 08/01/2021   Hyperparathyroidism ([place]) 08/01/2021   Spondylolisthesis 08/01/2021   Varicose veins of bilateral lower extremities with other complications 20/35/5974   Resolved Ambulatory Problems    Diagnosis Date Noted   S/P right TKA 16/38/4536   Complication of anesthesia    Pain, dental 08/31/2018   Past Medical History:  Diagnosis Date   Asthma    Breast cancer, stage 1 ([place]) 03/26/2011   Cardiomyopathy    Diabetes mellitus    Gout    SOCIAL HX:  Social History   Tobacco Use   Smoking status: Former    Packs/day: 0.10    Years: 26.00    Pack years: 2.60    Types: Cigarettes    Quit date: 03/23/2021    Years since quitting: 0.5   Smokeless tobacco: Never  Substance Use Topics   Alcohol use: No     ALLERGIES:  Allergies  Allergen Reactions   Ceftin Anaphylaxis    Face and throat swell    Geodon [Ziprasidone Hcl] Hives   Lisinopril Other (See Comments)    angioedema   Shellfish Allergy Other (See Comments)    Gout exacerbation   Shellfish-Derived Products     Other reaction(s): Other   Cefuroxime     Other reaction(s): anaphylaxis   Sulfacetamide Sodium-Sulfur     Other reaction(s): itch   Ziprasidone     Other reaction(s): hives on left arm   Allopurinol Nausea Only and Other (See Comments)    weakness   Ativan [Lorazepam] Itching   Sulfa Antibiotics Itching   Ultram [Tramadol Hcl] Itching   Ultram [Tramadol] Itching   Valium [Diazepam] Other (See Comments)    Patient states that diazepam doesn't relax, it has the opposite effect.     PERTINENT MEDICATIONS:  Outpatient Encounter Medications as of 10/02/2021  Medication Sig   acetaZOLAMIDE (DIAMOX) 125 MG tablet Take 1 tablet (125 mg total) by mouth daily.   carvedilol (COREG) 3.125 MG tablet TAKE 1 TABLET (3.125 MG) BY MOUTH TWICE DAILY WITH MEALS   citalopram (CELEXA) 40 MG tablet Take 40 mg by mouth daily.   clonazePAM (KLONOPIN) 0.5 MG tablet Take 0.5 mg by mouth  daily as needed for anxiety.   clonazePAM (KLONOPIN) 0.5 MG tablet 1 tablet (Patient not taking: Reported on 09/29/2021)  dicyclomine (BENTYL) 20 MG tablet Take 20 mg by mouth 3 (three) times daily before meals.   DULoxetine (CYMBALTA) 60 MG capsule Take 60 mg by mouth daily.   fexofenadine (ALLEGRA) 180 MG tablet Take 180 mg by mouth 2 (two) times daily. (Patient not taking: Reported on 08/26/2021)   fludrocortisone (FLORINEF) 0.1 MG tablet Take 1 tablet (100 mcg total) by mouth 2 (two) times daily. (Patient not taking: Reported on 08/19/2021)   fluticasone (FLONASE) 50 MCG/ACT nasal spray    gabapentin (NEURONTIN) 300 MG capsule Take 1 capsule by mouth 3 (three) times daily.   gabapentin (NEURONTIN) 800 MG tablet TAKE 1 TABLET AT BEDTIME AS NEEDED FOR NEUROPATHY (Patient not taking: Reported on 08/26/2021)   ipratropium (ATROVENT) 0.03 % nasal spray Place 2 sprays into both nostrils 2 (two) times daily.   levothyroxine (SYNTHROID) 88 MCG tablet TAKE 1 TABLET(88 MCG) BY MOUTH DAILY BEFORE BREAKFAST   metolazone (ZAROXOLYN) 2.5 MG tablet Take 1 tablet by mouth every other Tuesday.   midodrine (PROAMATINE) 10 MG tablet TAKE 1 TABLET THREE TIMES DAILY WITH MEALS   mirtazapine (REMERON) 30 MG tablet 1 tablet at bedtime. (Patient not taking: Reported on 08/26/2021)   montelukast (SINGULAIR) 10 MG tablet Take 10 mg by mouth at bedtime. (Patient not taking: Reported on 05/13/2021)   Multiple Vitamin (MULTIVITAMIN) tablet Take 1 tablet by mouth daily.   omeprazole (PRILOSEC) 40 MG capsule Take 40 mg by mouth daily. (Patient not taking: Reported on 07/01/2021)   potassium chloride SA (KLOR-CON M) 20 MEQ tablet Take 3 tablets (60 mEq total) by mouth 3 (three) times daily.   simvastatin (ZOCOR) 10 MG tablet Take 1 tablet (10 mg total) by mouth every evening.   spironolactone (ALDACTONE) 25 MG tablet Take 1 tablet (25 mg total) by mouth every evening. Needs appt for further refills   torsemide (DEMADEX) 20 MG tablet  Take 4 tablets (80 mg total) by mouth every morning AND 3 tablets (60 mg total) every evening.   No facility-administered encounter medications on file as of 10/02/2021.   Thank you for the opportunity to participate in the care of Ms. Moan.  The palliative care team will continue to follow. Please call our office at [phone] if we can be of additional assistance.   Hollace Kinnier, DO  COVID-19 PATIENT SCREENING TOOL Asked and negative response unless otherwise noted:  Have you had symptoms of covid, tested positive or been in contact with someone with symptoms/positive test in the past 5-10 days? no

## 2021-10-05 ENCOUNTER — Emergency Department (HOSPITAL_COMMUNITY): Payer: Medicare HMO

## 2021-10-05 ENCOUNTER — Other Ambulatory Visit: Payer: Self-pay

## 2021-10-05 ENCOUNTER — Emergency Department (HOSPITAL_COMMUNITY)
Admission: EM | Admit: 2021-10-05 | Discharge: 2021-10-06 | Discharge status: Against Medical Advice | Payer: Medicare HMO | Source: Home / Self Care

## 2021-10-05 ENCOUNTER — Encounter (HOSPITAL_COMMUNITY): Payer: Self-pay

## 2021-10-05 DIAGNOSIS — K8070 Calculus of gallbladder and bile duct without cholecystitis without obstruction: Secondary | ICD-10-CM | POA: Diagnosis not present

## 2021-10-05 DIAGNOSIS — Z95810 Presence of automatic (implantable) cardiac defibrillator: Secondary | ICD-10-CM | POA: Diagnosis not present

## 2021-10-05 DIAGNOSIS — R1011 Right upper quadrant pain: Secondary | ICD-10-CM | POA: Diagnosis not present

## 2021-10-05 DIAGNOSIS — I951 Orthostatic hypotension: Secondary | ICD-10-CM | POA: Diagnosis present

## 2021-10-05 DIAGNOSIS — F411 Generalized anxiety disorder: Secondary | ICD-10-CM | POA: Diagnosis present

## 2021-10-05 DIAGNOSIS — F419 Anxiety disorder, unspecified: Secondary | ICD-10-CM | POA: Insufficient documentation

## 2021-10-05 DIAGNOSIS — R112 Nausea with vomiting, unspecified: Secondary | ICD-10-CM | POA: Insufficient documentation

## 2021-10-05 DIAGNOSIS — Z20822 Contact with and (suspected) exposure to covid-19: Secondary | ICD-10-CM | POA: Diagnosis not present

## 2021-10-05 DIAGNOSIS — R079 Chest pain, unspecified: Secondary | ICD-10-CM | POA: Insufficient documentation

## 2021-10-05 DIAGNOSIS — E871 Hypo-osmolality and hyponatremia: Secondary | ICD-10-CM | POA: Diagnosis not present

## 2021-10-05 DIAGNOSIS — E861 Hypovolemia: Secondary | ICD-10-CM | POA: Diagnosis present

## 2021-10-05 DIAGNOSIS — K8050 Calculus of bile duct without cholangitis or cholecystitis without obstruction: Secondary | ICD-10-CM | POA: Diagnosis not present

## 2021-10-05 DIAGNOSIS — R109 Unspecified abdominal pain: Secondary | ICD-10-CM | POA: Insufficient documentation

## 2021-10-05 DIAGNOSIS — N1832 Chronic kidney disease, stage 3b: Secondary | ICD-10-CM | POA: Diagnosis not present

## 2021-10-05 DIAGNOSIS — M7989 Other specified soft tissue disorders: Secondary | ICD-10-CM | POA: Diagnosis not present

## 2021-10-05 DIAGNOSIS — R630 Anorexia: Secondary | ICD-10-CM | POA: Insufficient documentation

## 2021-10-05 DIAGNOSIS — K8020 Calculus of gallbladder without cholecystitis without obstruction: Secondary | ICD-10-CM | POA: Diagnosis not present

## 2021-10-05 DIAGNOSIS — I700 Atherosclerosis of aorta: Secondary | ICD-10-CM | POA: Diagnosis not present

## 2021-10-05 DIAGNOSIS — R140 Abdominal distension (gaseous): Secondary | ICD-10-CM | POA: Diagnosis not present

## 2021-10-05 DIAGNOSIS — R197 Diarrhea, unspecified: Secondary | ICD-10-CM | POA: Diagnosis not present

## 2021-10-05 DIAGNOSIS — I5022 Chronic systolic (congestive) heart failure: Secondary | ICD-10-CM | POA: Diagnosis not present

## 2021-10-05 DIAGNOSIS — Z91013 Allergy to seafood: Secondary | ICD-10-CM | POA: Diagnosis not present

## 2021-10-05 DIAGNOSIS — N183 Chronic kidney disease, stage 3 unspecified: Secondary | ICD-10-CM | POA: Diagnosis not present

## 2021-10-05 DIAGNOSIS — S93402A Sprain of unspecified ligament of left ankle, initial encounter: Secondary | ICD-10-CM | POA: Diagnosis present

## 2021-10-05 DIAGNOSIS — N179 Acute kidney failure, unspecified: Secondary | ICD-10-CM | POA: Diagnosis not present

## 2021-10-05 DIAGNOSIS — E785 Hyperlipidemia, unspecified: Secondary | ICD-10-CM | POA: Diagnosis present

## 2021-10-05 DIAGNOSIS — E1122 Type 2 diabetes mellitus with diabetic chronic kidney disease: Secondary | ICD-10-CM | POA: Diagnosis not present

## 2021-10-05 DIAGNOSIS — Z882 Allergy status to sulfonamides status: Secondary | ICD-10-CM | POA: Diagnosis not present

## 2021-10-05 DIAGNOSIS — Z5321 Procedure and treatment not carried out due to patient leaving prior to being seen by health care provider: Secondary | ICD-10-CM | POA: Insufficient documentation

## 2021-10-05 DIAGNOSIS — F410 Panic disorder [episodic paroxysmal anxiety] without agoraphobia: Secondary | ICD-10-CM | POA: Diagnosis not present

## 2021-10-05 DIAGNOSIS — E1140 Type 2 diabetes mellitus with diabetic neuropathy, unspecified: Secondary | ICD-10-CM | POA: Diagnosis not present

## 2021-10-05 DIAGNOSIS — Z6841 Body Mass Index (BMI) 40.0 and over, adult: Secondary | ICD-10-CM | POA: Diagnosis not present

## 2021-10-05 DIAGNOSIS — E039 Hypothyroidism, unspecified: Secondary | ICD-10-CM | POA: Diagnosis not present

## 2021-10-05 DIAGNOSIS — R0602 Shortness of breath: Secondary | ICD-10-CM | POA: Diagnosis not present

## 2021-10-05 DIAGNOSIS — M25572 Pain in left ankle and joints of left foot: Secondary | ICD-10-CM | POA: Diagnosis not present

## 2021-10-05 DIAGNOSIS — I10 Essential (primary) hypertension: Secondary | ICD-10-CM | POA: Diagnosis not present

## 2021-10-05 DIAGNOSIS — Z888 Allergy status to other drugs, medicaments and biological substances status: Secondary | ICD-10-CM | POA: Diagnosis not present

## 2021-10-05 DIAGNOSIS — Z853 Personal history of malignant neoplasm of breast: Secondary | ICD-10-CM | POA: Diagnosis not present

## 2021-10-05 DIAGNOSIS — Z820 Family history of epilepsy and other diseases of the nervous system: Secondary | ICD-10-CM | POA: Diagnosis not present

## 2021-10-05 DIAGNOSIS — Z794 Long term (current) use of insulin: Secondary | ICD-10-CM | POA: Diagnosis not present

## 2021-10-05 DIAGNOSIS — X58XXXA Exposure to other specified factors, initial encounter: Secondary | ICD-10-CM | POA: Diagnosis present

## 2021-10-05 DIAGNOSIS — I130 Hypertensive heart and chronic kidney disease with heart failure and stage 1 through stage 4 chronic kidney disease, or unspecified chronic kidney disease: Secondary | ICD-10-CM | POA: Diagnosis not present

## 2021-10-05 DIAGNOSIS — E860 Dehydration: Secondary | ICD-10-CM | POA: Diagnosis not present

## 2021-10-05 DIAGNOSIS — J9610 Chronic respiratory failure, unspecified whether with hypoxia or hypercapnia: Secondary | ICD-10-CM | POA: Diagnosis not present

## 2021-10-05 HISTORY — DX: 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: Q35.5

## 2021-10-05 LAB — COMPREHENSIVE METABOLIC PANEL
ALT: 39 U/L (ref 0–44)
AST: 31 U/L (ref 15–41)
Albumin: 3.7 g/dL (ref 3.5–5.0)
Alkaline Phosphatase: 259 U/L — ABNORMAL HIGH (ref 38–126)
Anion gap: 11 (ref 5–15)
BUN: 34 mg/dL — ABNORMAL HIGH (ref 8–23)
CO2: 22 mmol/L (ref 22–32)
Calcium: 9.9 mg/dL (ref 8.9–10.3)
Chloride: 101 mmol/L (ref 98–111)
Creatinine, Ser: 1.78 mg/dL — ABNORMAL HIGH (ref 0.44–1.00)
GFR, Estimated: 31 mL/min — ABNORMAL LOW (ref >60)
Glucose, Bld: 166 mg/dL — ABNORMAL HIGH (ref 70–99)
Potassium: 3.6 mmol/L (ref 3.5–5.1)
Sodium: 134 mmol/L — ABNORMAL LOW (ref 135–145)
Total Bilirubin: 1 mg/dL (ref 0.3–1.2)
Total Protein: 9.2 g/dL — ABNORMAL HIGH (ref 6.5–8.1)

## 2021-10-05 LAB — URINALYSIS, ROUTINE W REFLEX MICROSCOPIC
Bilirubin Urine: NEGATIVE
Glucose, UA: NEGATIVE mg/dL
Ketones, ur: NEGATIVE mg/dL
Nitrite: NEGATIVE
Protein, ur: NEGATIVE mg/dL
Specific Gravity, Urine: 1.01 (ref 1.005–1.030)
pH: 7.5 (ref 5.0–8.0)

## 2021-10-05 LAB — URINALYSIS, MICROSCOPIC (REFLEX)

## 2021-10-05 LAB — CBC
HCT: 43.8 % (ref 36.0–46.0)
Hemoglobin: 14.6 g/dL (ref 12.0–15.0)
MCH: 33 pg (ref 26.0–34.0)
MCHC: 33.3 g/dL (ref 30.0–36.0)
MCV: 98.9 fL (ref 80.0–100.0)
Platelets: 327 10*3/uL (ref 150–400)
RBC: 4.43 MIL/uL (ref 3.87–5.11)
RDW: 12.1 % (ref 11.5–15.5)
WBC: 12.5 10*3/uL — ABNORMAL HIGH (ref 4.0–10.5)
nRBC: 0 % (ref 0.0–0.2)

## 2021-10-05 LAB — LIPASE, BLOOD: Lipase: 25 U/L (ref 11–51)

## 2021-10-05 LAB — TROPONIN I (HIGH SENSITIVITY): Troponin I (High Sensitivity): 17 ng/L (ref <18)

## 2021-10-05 NOTE — ED Provider Triage Note (Signed)
Emergency Medicine Provider Triage Evaluation Note ? ?Rhonda [name] , a 71 y.o. female  was evaluated in triage.  Pt complains of emesis, chest pain, and abdominal pain.  This has been ongoing since Thursday.  She reports that she is "freaking out." ? ?Physical Exam  ?BP (!) 144/77 (BP Location: Left Arm)   Pulse 92   Temp 98 ?F (36.7 ?C) (Oral)   Resp 18   SpO2 99%  ?Gen:   Awake, no distress   ?Resp:  Normal effort  ?MSK:   Moves extremities without difficulty  ?Other:  Tearful.  Abdomen is generally tender ? ?Medical Decision Making  ?Medically screening exam initiated at 10:38 PM.  Appropriate orders placed.  Rhonda [name] was informed that the remainder of the evaluation will be completed by another provider, this initial triage assessment does not replace that evaluation, and the importance of remaining in the ED until their evaluation is complete. ? ?Will order labs, CT scan ?  ?Lorin Glass, PA-C ?10/05/21 2243 ? ?

## 2021-10-05 NOTE — ED Triage Notes (Signed)
Patient arrives with husband POV from home c/o vomitting since Thursday. Pt reports a decreased appetite, nausea, and anxiety. ?

## 2021-10-06 ENCOUNTER — Other Ambulatory Visit: Payer: Self-pay

## 2021-10-06 ENCOUNTER — Encounter (HOSPITAL_COMMUNITY): Payer: Self-pay

## 2021-10-06 ENCOUNTER — Emergency Department (HOSPITAL_COMMUNITY): Payer: Medicare HMO

## 2021-10-06 ENCOUNTER — Other Ambulatory Visit (HOSPITAL_COMMUNITY): Payer: Self-pay

## 2021-10-06 ENCOUNTER — Emergency Department (HOSPITAL_COMMUNITY)
Admission: EM | Admit: 2021-10-06 | Discharge: 2021-10-06 | Disposition: A | Discharge status: Home / Self Care | Payer: Medicare HMO | Source: Home / Self Care | Attending: Emergency Medicine | Admitting: Emergency Medicine

## 2021-10-06 DIAGNOSIS — R0602 Shortness of breath: Secondary | ICD-10-CM | POA: Insufficient documentation

## 2021-10-06 DIAGNOSIS — R112 Nausea with vomiting, unspecified: Secondary | ICD-10-CM | POA: Insufficient documentation

## 2021-10-06 DIAGNOSIS — R197 Diarrhea, unspecified: Secondary | ICD-10-CM | POA: Insufficient documentation

## 2021-10-06 DIAGNOSIS — R944 Abnormal results of kidney function studies: Secondary | ICD-10-CM | POA: Insufficient documentation

## 2021-10-06 DIAGNOSIS — Z20822 Contact with and (suspected) exposure to covid-19: Secondary | ICD-10-CM | POA: Insufficient documentation

## 2021-10-06 DIAGNOSIS — E860 Dehydration: Secondary | ICD-10-CM

## 2021-10-06 DIAGNOSIS — R1013 Epigastric pain: Secondary | ICD-10-CM | POA: Insufficient documentation

## 2021-10-06 LAB — CBC WITH DIFFERENTIAL/PLATELET
Abs Immature Granulocytes: 0.07 10*3/uL (ref 0.00–0.07)
Basophils Absolute: 0 10*3/uL (ref 0.0–0.1)
Basophils Relative: 0 %
Eosinophils Absolute: 0.1 10*3/uL (ref 0.0–0.5)
Eosinophils Relative: 1 %
HCT: 43.8 % (ref 36.0–46.0)
Hemoglobin: 14.8 g/dL (ref 12.0–15.0)
Immature Granulocytes: 1 %
Lymphocytes Relative: 14 %
Lymphs Abs: 1.8 10*3/uL (ref 0.7–4.0)
MCH: 33.8 pg (ref 26.0–34.0)
MCHC: 33.8 g/dL (ref 30.0–36.0)
MCV: 100 fL (ref 80.0–100.0)
Monocytes Absolute: 0.8 10*3/uL (ref 0.1–1.0)
Monocytes Relative: 6 %
Neutro Abs: 9.9 10*3/uL — ABNORMAL HIGH (ref 1.7–7.7)
Neutrophils Relative %: 78 %
Platelets: 308 10*3/uL (ref 150–400)
RBC: 4.38 MIL/uL (ref 3.87–5.11)
RDW: 12.3 % (ref 11.5–15.5)
WBC: 12.7 10*3/uL — ABNORMAL HIGH (ref 4.0–10.5)
nRBC: 0 % (ref 0.0–0.2)

## 2021-10-06 LAB — COMPREHENSIVE METABOLIC PANEL
ALT: 36 U/L (ref 0–44)
AST: 33 U/L (ref 15–41)
Albumin: 4.1 g/dL (ref 3.5–5.0)
Alkaline Phosphatase: 242 U/L — ABNORMAL HIGH (ref 38–126)
Anion gap: 12 (ref 5–15)
BUN: 42 mg/dL — ABNORMAL HIGH (ref 8–23)
CO2: 23 mmol/L (ref 22–32)
Calcium: 9.9 mg/dL (ref 8.9–10.3)
Chloride: 99 mmol/L (ref 98–111)
Creatinine, Ser: 2.04 mg/dL — ABNORMAL HIGH (ref 0.44–1.00)
GFR, Estimated: 26 mL/min — ABNORMAL LOW (ref >60)
Glucose, Bld: 166 mg/dL — ABNORMAL HIGH (ref 70–99)
Potassium: 5 mmol/L (ref 3.5–5.1)
Sodium: 134 mmol/L — ABNORMAL LOW (ref 135–145)
Total Bilirubin: 0.8 mg/dL (ref 0.3–1.2)
Total Protein: 9.4 g/dL — ABNORMAL HIGH (ref 6.5–8.1)

## 2021-10-06 LAB — URINALYSIS, ROUTINE W REFLEX MICROSCOPIC
Bilirubin Urine: NEGATIVE
Glucose, UA: NEGATIVE mg/dL
Hgb urine dipstick: NEGATIVE
Ketones, ur: NEGATIVE mg/dL
Nitrite: NEGATIVE
Protein, ur: NEGATIVE mg/dL
Specific Gravity, Urine: 1.014 (ref 1.005–1.030)
pH: 5 (ref 5.0–8.0)

## 2021-10-06 LAB — RESP PANEL BY RT-PCR (FLU A&B, COVID) ARPGX2
Influenza A by PCR: NEGATIVE
Influenza B by PCR: NEGATIVE
SARS Coronavirus 2 by RT PCR: NEGATIVE

## 2021-10-06 LAB — LIPASE, BLOOD: Lipase: 31 U/L (ref 11–51)

## 2021-10-06 MED ORDER — LACTATED RINGERS IV SOLN: INTRAVENOUS | Status: DC

## 2021-10-06 MED ORDER — LACTATED RINGERS IV BOLUS
500.0000 mL | Freq: Once | INTRAVENOUS | Status: AC
Start: 2021-10-06 — End: 2021-10-06
  Administered 2021-10-06: 500 mL via INTRAVENOUS

## 2021-10-06 MED ORDER — ONDANSETRON HCL 4 MG/2ML IJ SOLN
4.0000 mg | Freq: Once | INTRAMUSCULAR | Status: AC
  Administered 2021-10-06: 4 mg via INTRAVENOUS
  Filled 2021-10-06: qty 2

## 2021-10-06 MED ORDER — CLONAZEPAM 0.5 MG PO TABS
0.5000 mg | ORAL_TABLET | ORAL | Status: AC
  Administered 2021-10-06: 0.5 mg via ORAL
  Filled 2021-10-06: qty 1

## 2021-10-06 MED ORDER — LACTATED RINGERS IV BOLUS: 2000.0000 mL | Freq: Once | INTRAVENOUS | Status: DC

## 2021-10-06 NOTE — ED Provider Notes (Addendum)
[facility]-EMERGENCY DEPT Provider Note   CSN: [id number] Arrival date & time: 10/06/21  1222     History  Chief Complaint  Patient presents with   Hypotension   Shortness of Breath   Nausea    Rhonda [name] is a 71 y.o. female.  71 year old female presents with 4 days of nausea vomiting diarrhea with associated abdominal discomfort.  No reported fever or chills.  Emesis has been nonbilious or bloody.  She denies any urinary symptoms.  Notes some associated shortness of breath but denies any new cough.  No anginal type symptoms.  Went to see her doctor today and was found to be hypotensive and patient brought here.  Of note, patient seen at [facility] 2 days ago and had blood work done and patient did not stay to be seen for a complete evaluation      Home Medications Prior to Admission medications   Medication Sig Start Date End Date Taking? Authorizing Provider  acetaZOLAMIDE (DIAMOX) 125 MG tablet Take 1 tablet (125 mg total) by mouth daily. 02/27/21   Patel, Roxana Hires K, DO  carvedilol (COREG) 3.125 MG tablet TAKE 1 TABLET (3.125 MG) BY MOUTH TWICE DAILY WITH MEALS 10/01/21   Andrey Farmer, PA-C  citalopram (CELEXA) 40 MG tablet Take 40 mg by mouth daily.    [provider]  clonazePAM (KLONOPIN) 0.5 MG tablet Take 0.5 mg by mouth daily as needed for anxiety. 07/23/20   [provider]  clonazePAM (KLONOPIN) 0.5 MG tablet 1 tablet Patient not taking: Reported on 09/29/2021    [provider]  dicyclomine (BENTYL) 20 MG tablet Take 20 mg by mouth 3 (three) times daily before meals.    [provider]  DULoxetine (CYMBALTA) 60 MG capsule Take 60 mg by mouth daily.    [provider]  fexofenadine (ALLEGRA) 180 MG tablet Take 180 mg by mouth 2 (two) times daily. Patient not taking: Reported on 08/26/2021    [provider]  fludrocortisone (FLORINEF) 0.1 MG tablet Take 1 tablet (100 mcg total) by  mouth 2 (two) times daily. Patient not taking: Reported on 08/19/2021 02/27/21   Nita Sickle K, DO  fluticasone Aleda Grana) 50 MCG/ACT nasal spray  08/17/20   [provider]  gabapentin (NEURONTIN) 300 MG capsule Take 1 capsule by mouth 3 (three) times daily.    [provider]  gabapentin (NEURONTIN) 800 MG tablet TAKE 1 TABLET AT BEDTIME AS NEEDED FOR NEUROPATHY Patient not taking: Reported on 08/26/2021 08/13/21   Nita Sickle K, DO  ipratropium (ATROVENT) 0.03 % nasal spray Place 2 sprays into both nostrils 2 (two) times daily.    [provider]  levothyroxine (SYNTHROID) 88 MCG tablet TAKE 1 TABLET(88 MCG) BY MOUTH DAILY BEFORE BREAKFAST 07/08/20   Carlus Pavlov, MD  metolazone (ZAROXOLYN) 2.5 MG tablet Take 1 tablet by mouth every other Tuesday. 10/01/21   Andrey Farmer, PA-C  midodrine (PROAMATINE) 10 MG tablet TAKE 1 TABLET THREE TIMES DAILY WITH MEALS 10/01/21   Andrey Farmer, PA-C  mirtazapine (REMERON) 30 MG tablet 1 tablet at bedtime. Patient not taking: Reported on 08/26/2021    [provider]  montelukast (SINGULAIR) 10 MG tablet Take 10 mg by mouth at bedtime. Patient not taking: Reported on 05/13/2021 09/28/16   [provider]  Multiple Vitamin (MULTIVITAMIN) tablet Take 1 tablet by mouth daily.    [provider]  omeprazole (PRILOSEC) 40 MG capsule Take 40 mg by  mouth daily. Patient not taking: Reported on 07/01/2021    [provider]  potassium chloride SA (KLOR-CON M) 20 MEQ tablet Take 3 tablets (60 mEq total) by mouth 3 (three) times daily. 10/01/21   Andrey Farmer, PA-C  simvastatin (ZOCOR) 10 MG tablet Take 1 tablet (10 mg total) by mouth every evening. 10/01/21   Andrey Farmer, PA-C  spironolactone (ALDACTONE) 25 MG tablet Take 1 tablet (25 mg total) by mouth every evening. Needs appt for further refills 10/01/21   Andrey Farmer, PA-C  torsemide (DEMADEX) 20 MG tablet Take 4  tablets (80 mg total) by mouth every morning AND 3 tablets (60 mg total) every evening. 10/01/21   Andrey Farmer, PA-C      Allergies    Ceftin, Geodon [ziprasidone hcl], Lisinopril, Shellfish allergy, Shellfish-derived products, Cefuroxime, Sulfacetamide sodium-sulfur, Ziprasidone, Allopurinol, Ativan [lorazepam], Sulfa antibiotics, Ultram [tramadol hcl], Ultram [tramadol], and Valium [diazepam]    Review of Systems   Review of Systems  All other systems reviewed and are negative.  Physical Exam Updated Vital Signs BP (!) 135/98 (BP Location: Left Arm)   Pulse 82   Temp 98.8 F (37.1 C) (Oral)   Resp 18   Ht 1.524 m (5')   Wt 95.7 kg   SpO2 99%   BMI 41.21 kg/m  Physical Exam Vitals and nursing note reviewed.  Constitutional:      General: She is not in acute distress.    Appearance: Normal appearance. She is well-developed. She is not toxic-appearing.  HENT:     Head: Normocephalic and atraumatic.  Eyes:     General: Lids are normal.     Conjunctiva/sclera: Conjunctivae normal.     Pupils: Pupils are equal, round, and reactive to light.  Neck:     Thyroid: No thyroid mass.     Trachea: No tracheal deviation.  Cardiovascular:     Rate and Rhythm: Normal rate and regular rhythm.     Heart sounds: Normal heart sounds. No murmur heard.   No gallop.  Pulmonary:     Effort: Pulmonary effort is normal. No respiratory distress.     Breath sounds: Normal breath sounds. No stridor. No decreased breath sounds, wheezing, rhonchi or rales.  Abdominal:     General: There is no distension.     Palpations: Abdomen is soft.     Tenderness: There is abdominal tenderness in the epigastric area. There is no rebound.    Musculoskeletal:        General: No tenderness. Normal range of motion.     Cervical back: Normal range of motion and neck supple.  Skin:    General: Skin is warm and dry.     Findings: No abrasion or rash.  Neurological:     Mental Status: She is alert and  oriented to person, place, and time. Mental status is at baseline.     GCS: GCS eye subscore is 4. GCS verbal subscore is 5. GCS motor subscore is 6.     Cranial Nerves: No cranial nerve deficit.     Sensory: No sensory deficit.     Motor: Motor function is intact.  Psychiatric:        Attention and Perception: Attention normal.        Speech: Speech normal.        Behavior: Behavior normal.    ED Results / Procedures / Treatments   Labs (all labs ordered are listed, but only abnormal results are displayed) Labs  Reviewed  CBC WITH DIFFERENTIAL/PLATELET - Abnormal; Notable for the following components:      Result Value   WBC 12.7 (*)    Neutro Abs 9.9 (*)    All other components within normal limits  COMPREHENSIVE METABOLIC PANEL - Abnormal; Notable for the following components:   Sodium 134 (*)    Glucose, Bld 166 (*)    BUN 42 (*)    Creatinine, Ser 2.04 (*)    Total Protein 9.4 (*)    Alkaline Phosphatase 242 (*)    GFR, Estimated 26 (*)    All other components within normal limits  URINALYSIS, ROUTINE W REFLEX MICROSCOPIC - Abnormal; Notable for the following components:   APPearance CLOUDY (*)    Leukocytes,Ua LARGE (*)    Bacteria, UA RARE (*)    All other components within normal limits  RESP PANEL BY RT-PCR (FLU A&B, COVID) ARPGX2  LIPASE, BLOOD    EKG EKG Interpretation  Date/Time:  Monday Oct 06 2021 12:40:09 EDT Ventricular Rate:  78 PR Interval:  154 QRS Duration: 144 QT Interval:  450 QTC Calculation: 513 R Axis:   207 Text Interpretation: Atrial-sensed ventricular-paced rhythm Abnormal ECG When compared with ECG of 23-Apr-2021 14:40, PREVIOUS ECG IS PRESENT No significant change since last tracing Confirmed by Lorre Nick ([id number]) on 10/06/2021 4:51:12 PM  Radiology DG Chest 2 View  Result Date: 10/05/2021 CLINICAL DATA:  Chest pain with nausea and vomiting. EXAM: CHEST - 2 VIEW COMPARISON:  June 03, 2021 FINDINGS: There is a multi lead AICD. The  heart size and mediastinal contours are within normal limits. There is marked severity calcification of the aortic arch. Low lung volumes are seen. There is no evidence of acute infiltrate, pleural effusion or pneumothorax. Multilevel degenerative changes seen throughout the thoracic spine. IMPRESSION: Low lung volumes without evidence of acute or active cardiopulmonary disease. Electronically Signed   By: Aram Candela M.D.   On: 10/05/2021 23:08    Procedures Procedures    Medications Ordered in ED Medications  lactated ringers infusion (has no administration in time range)  ondansetron (ZOFRAN) injection 4 mg (has no administration in time range)  lactated ringers bolus 500 mL (has no administration in time range)    ED Course/ Medical Decision Making/ A&P                           Medical Decision Making Amount and/or Complexity of Data Reviewed Radiology: ordered.  Risk Prescription drug management.  Patient had no evidence of hypotension during her stay here. Patient presented with nausea and vomiting diarrhea.  Patient had evidence of dehydration based on elevated BUN/creatinine and was given IV fluids and feels better after this.  She did have some anxiety which was treated with her home medication of Xanax.  Urinalysis noted but feel it is contaminated and patient has no urinary symptoms.  COVID and flu test negative.  Concern for possible GI source of her symptoms.  Patient had abdominal cramping and she subsequently had a CT which per my interpretation demonstrated gallstones with some biliary duct distention.  Patient's LFTs show only an increased alk phos but normal bili.  Patient will need outpatient referral to GI for this. After treatment with IV fluids she feels much better like to go home at this time.  Have instructed patient to follow-up with her primary care doctor for GI referral.  Return precautions given.  This was discussed and from the  patient's husband who is  agreeable to taking her home.  Do not feel that she needs inpatient admission at this time.       Final Clinical Impression(s) / ED Diagnoses Final diagnoses:  None    Rx / DC Orders ED Discharge Orders     None         Lorre Nick, MD 10/06/21 2014    Lorre Nick, MD 10/06/21 2015

## 2021-10-06 NOTE — Progress Notes (Signed)
Made in error

## 2021-10-06 NOTE — Discharge Instructions (Signed)
You have dilated bile ducts in your liver.  Call your primary care doctor tomorrow to obtain a referral to gastroenterology.  Return here at once if you should develop fever, any abdominal pain, or any other problems ?

## 2021-10-06 NOTE — Progress Notes (Signed)
Paramedicine Encounter ? ? ? Patient ID: SIDONIE DEXHEIMER, female    DOB: Oct 22, 1950, 71 y.o.   MRN: [id number] ? ?Arrived for home visit for Emely who was laying in bed alert but complaining of nausea, abdominal pain with diarrhea since Thursday 10/02/21. She denied eating anything out of her regular diet and has not recently added any new medications. She denied having been around anyone who has been sick. She states she gets short of breath when the nausea comes and she has a headache also.  ? ?Today her husband called her PCP office prior to my arrival and they instructed her to go to their walk in clinic. ? ?I obtained assessment and vitals. Nawaal was cool and clammy, answering all questions appropriately. She denied chest pain or dizziness. I was able to assist her in sitting up in bed. She ambulated to the bathroom with no assistance and had an episode of diarrhea. She took 2 immodium and 2 tylenol at this time. She reports she had not vomited in the last 2 hours. She had no had morning meds and these were held until she returns home. No lower leg edema noted. Lungs clear.  ? ?I reviewed her medications and filled one pill box for one week. I also made an updated med list for her husband ED to take with them to the appointment.  ? ?I assisted Takeshia in getting dressed and ambulating out to the car for her husband to take her to the walk in clinic, I explained at any point he felt necessary to call EMS or to divert to the ER to do so. He felt comfortable with this and agreed.  ? ?Mrs. Hazelrigg agreed with plan and I plan to come back out if needed for any med changes and follow up. Otherwise I plan to see her in one week.  ? ?Refills:Midodrine  ? ?-Update: Spoke to husband and he reports clinic advised her to go to ER due to hypotension episode on her arrival. She is in ER and I will follow up once she is discharged.  ? ? ? ?Patient Care Team: ?Damaris Schooner as PCP - General (Family  Medicine) ?[name], LCSW as Education officer, museum (Licensed Holiday representative) ?Alda Berthold, DO as Consulting Physician (Neurology) ?[name], EMT as Paramedic ?[name], MD as Attending Physician (Family Medicine) ? ?Patient Active Problem List  ? Diagnosis Date Noted  ? Decreased estrogen level 08/01/2021  ? Dementia ([place]) 08/01/2021  ? Frail elderly 08/01/2021  ? Hyperparathyroidism ([place]) 08/01/2021  ? Spondylolisthesis 08/01/2021  ? Varicose veins of bilateral lower extremities with other complications 40/98/1191  ? Lumbago without sciatica 04/30/2021  ? Abnormal gait 06/11/2020  ? Adult failure to thrive syndrome 06/11/2020  ? Allergic rhinitis 06/11/2020  ? Anxiety 06/11/2020  ? Asthma 06/11/2020  ? Benign intracranial hypertension 06/11/2020  ? Body mass index (BMI) 45.0-49.9, adult ([place]) 06/11/2020  ? Bowel incontinence 06/11/2020  ? Cholelithiasis 06/11/2020  ? Chronic sinusitis 06/11/2020  ? 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  ? Daytime somnolence 06/11/2020  ? Diarrhea of presumed infectious origin 06/11/2020  ? Edema 06/11/2020  ? Family history of malignant neoplasm of gastrointestinal tract 06/11/2020  ? Gout 06/11/2020  ? History of fall 06/11/2020  ? History of infectious disease 06/11/2020  ? Insomnia 06/11/2020  ? Irregular bowel habits 06/11/2020  ? 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 Lumbar spondylosis with myelopathy 06/11/2020  ? Macrocytosis 06/11/2020  ? Mild recurrent major depression ([place])  06/11/2020  ? Personal history of colonic polyps 06/11/2020  ? Personal history of malignant neoplasm of breast 06/11/2020  ? Repeated falls 06/11/2020  ? Mixed anxiety and depressive disorder 06/11/2020  ? Irritable bowel syndrome 01/04/2020  ? Spinal stenosis of lumbar region 01/03/2020  ? Other symptoms and signs involving the musculoskeletal system 09/11/2019  ? Bilateral leg weakness 08/01/2019  ? Leg swelling 08/01/2019  ? Diabetes mellitus with neuropathy ([place]) 06/15/2019  ?  Hypokalemia 11/08/2018  ? CKD (chronic kidney disease) stage 3, GFR 30-59 ml/min (HCC) 11/08/2018  ? Orthostatic hypotension 07/28/2017  ? SVD (spontaneous vaginal delivery)   ? Peripheral neuropathy   ? On home oxygen therapy   ? Migraines   ? Left bundle branch block   ? Hypothyroidism   ? Hypertension   ? Hyperlipidemia   ? Heart murmur   ? GERD (gastroesophageal reflux disease)   ? Exertional shortness of breath   ? Major depressive disorder   ? Back pain   ? Arthritis   ? Generalized anxiety disorder   ? Anemia   ? Chronic respiratory failure ([place]) 09/14/2013  ? 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 Morbid obesity ([place]) 11/01/2012  ? Chronic systolic heart failure ([place]) 10/27/2012  ? Endometrial polyp 01/20/2012  ? Malignant tumor of breast ([place]) 03/26/2011  ? Unspecified vitamin D deficiency 03/26/2011  ? ?No current facility-administered medications for this visit. ? ?Current Outpatient Medications:  ?  acetaZOLAMIDE (DIAMOX) 125 MG tablet, Take 1 tablet (125 mg total) by mouth daily., Disp: 90 tablet, Rfl: 3 ?  carvedilol (COREG) 3.125 MG tablet, TAKE 1 TABLET (3.125 MG) BY MOUTH TWICE DAILY WITH MEALS, Disp: 180 tablet, Rfl: 3 ?  citalopram (CELEXA) 40 MG tablet, Take 40 mg by mouth daily., Disp: , Rfl:  ?  clonazePAM (KLONOPIN) 0.5 MG tablet, Take 0.5 mg by mouth daily as needed for anxiety., Disp: , Rfl:  ?  clonazePAM (KLONOPIN) 0.5 MG tablet, 1 tablet (Patient not taking: Reported on 09/29/2021), Disp: , Rfl:  ?  dicyclomine (BENTYL) 20 MG tablet, Take 20 mg by mouth 3 (three) times daily before meals., Disp: , Rfl:  ?  DULoxetine (CYMBALTA) 60 MG capsule, Take 60 mg by mouth daily., Disp: , Rfl:  ?  fexofenadine (ALLEGRA) 180 MG tablet, Take 180 mg by mouth 2 (two) times daily. (Patient not taking: Reported on 08/26/2021), Disp: , Rfl:  ?  fludrocortisone (FLORINEF) 0.1 MG tablet, Take 1 tablet (100 mcg total) by mouth 2 (two) times daily. (Patient not taking:  Reported on 08/19/2021), Disp: 180 tablet, Rfl: 3 ?  fluticasone (FLONASE) 50 MCG/ACT nasal spray, , Disp: , Rfl:  ?  gabapentin (NEURONTIN) 300 MG capsule, Take 1 capsule by mouth 3 (three) times daily., Disp: , Rfl:  ?  gabapentin (NEURONTIN) 800 MG tablet, TAKE 1 TABLET AT BEDTIME AS NEEDED FOR NEUROPATHY (Patient not taking: Reported on 08/26/2021), Disp: 30 tablet, Rfl: 1 ?  ipratropium (ATROVENT) 0.03 % nasal spray, Place 2 sprays into both nostrils 2 (two) times daily., Disp: , Rfl:  ?  levothyroxine (SYNTHROID) 88 MCG tablet, TAKE 1 TABLET(88 MCG) BY MOUTH DAILY BEFORE BREAKFAST, Disp: 90 tablet, Rfl: 3 ?  metolazone (ZAROXOLYN) 2.5 MG tablet, Take 1 tablet by mouth every other Tuesday., Disp: 12 tablet, Rfl: 3 ?  midodrine (PROAMATINE) 10 MG tablet, TAKE 1 TABLET THREE TIMES DAILY WITH MEALS, Disp: 270 tablet, Rfl: 3 ?  mirtazapine (REMERON) 30 MG tablet, 1 tablet at bedtime. (  Patient not taking: Reported on 08/26/2021), Disp: , Rfl:  ?  montelukast (SINGULAIR) 10 MG tablet, Take 10 mg by mouth at bedtime. (Patient not taking: Reported on 05/13/2021), Disp: , Rfl: 1 ?  Multiple Vitamin (MULTIVITAMIN) tablet, Take 1 tablet by mouth daily., Disp: , Rfl:  ?  omeprazole (PRILOSEC) 40 MG capsule, Take 40 mg by mouth daily. (Patient not taking: Reported on 07/01/2021), Disp: , Rfl:  ?  potassium chloride SA (KLOR-CON M) 20 MEQ tablet, Take 3 tablets (60 mEq total) by mouth 3 (three) times daily., Disp: 572 tablet, Rfl: 0 ?  simvastatin (ZOCOR) 10 MG tablet, Take 1 tablet (10 mg total) by mouth every evening., Disp: 30 tablet, Rfl: 11 ?  spironolactone (ALDACTONE) 25 MG tablet, Take 1 tablet (25 mg total) by mouth every evening. Needs appt for further refills, Disp: 90 tablet, Rfl: 0 ?  torsemide (DEMADEX) 20 MG tablet, Take 4 tablets (80 mg total) by mouth every morning AND 3 tablets (60 mg total) every evening., Disp: 210 tablet, Rfl: 11 ? ?Facility-Administered Medications Ordered in Other Visits:  ?  lactated ringers  bolus 500 mL, 500 mL, Intravenous, Once, Lacretia Leigh, MD ?  lactated ringers infusion, , Intravenous, Continuous, Lacretia Leigh, MD ?  ondansetron [facility]) injection 4 mg, 4 mg, Intravenous, Once, Lacretia Leigh, MD ?All

## 2021-10-06 NOTE — ED Triage Notes (Signed)
Patient's husband reports that the patient has had SOB and nausea x 4 days. Patient went to the [facility] walk-in clinic today and was found to have hypotension-88/66.  ?Patient told to come to the ED. ?

## 2021-10-06 NOTE — ED Notes (Signed)
Pt family member up to desk asking about wait time. Pt informed, and decided to leave ED. Pt seen leaving ED. ?

## 2021-10-06 NOTE — ED Provider Triage Note (Signed)
Emergency Medicine Provider Triage Evaluation Note ? ?Rhonda [name] , [name] 72 y.o. female  was evaluated in triage.  Pt complains of low blood pressure onset today.  Patient was evaluated at [facility] walk-in clinic and noted to have [name] blood pressure in the 74F systolically and told to come to the emergency department for further evaluation of symptoms.  Patient was originally going to the [place] walk-in clinic due to nausea and vomiting x4 days.  Has associated diffuse abdominal pain.  Denies sick contacts.  Denies fever, chills, dysuria, hematuria. ? ?Review of Systems  ?Positive: As per HPI above ?Negative:  ? ?Physical Exam  ?BP (!) 129/91 (BP Location: Left Arm)   Pulse 79   Temp 98.8 ?F (37.1 ?C) (Oral)   Resp 20   Ht 5' (1.524 m)   Wt 95.7 kg   SpO2 100%   BMI 41.21 kg/m?  ?Gen:   Awake, no distress   ?Resp:  Normal effort  ?MSK:   Moves extremities without difficulty  ?Other:  Diffuse abdominal tenderness to palpation. ? ?Medical Decision Making  ?Medically screening exam initiated at 2:02 PM.  Appropriate orders placed.  Rhonda [name] was informed that the remainder of the evaluation will be completed by another provider, this initial triage assessment does not replace that evaluation, and the importance of remaining in the ED until their evaluation is complete. ? ?Work-up initiated ?  ?Rhonda Swarthout A, PA-C ?10/06/21 1404 ? ?

## 2021-10-08 ENCOUNTER — Encounter (HOSPITAL_BASED_OUTPATIENT_CLINIC_OR_DEPARTMENT_OTHER): Payer: Self-pay

## 2021-10-08 ENCOUNTER — Encounter: Payer: Self-pay | Admitting: Internal Medicine

## 2021-10-08 ENCOUNTER — Emergency Department (HOSPITAL_BASED_OUTPATIENT_CLINIC_OR_DEPARTMENT_OTHER): Payer: Medicare HMO

## 2021-10-08 ENCOUNTER — Other Ambulatory Visit: Payer: Self-pay

## 2021-10-08 ENCOUNTER — Inpatient Hospital Stay (HOSPITAL_BASED_OUTPATIENT_CLINIC_OR_DEPARTMENT_OTHER)
Admission: EM | Admit: 2021-10-08 | Discharge: 2021-10-12 | DRG: 445 | Disposition: A | Discharge status: Home / Self Care | Payer: Medicare HMO | Attending: Internal Medicine | Admitting: Internal Medicine

## 2021-10-08 DIAGNOSIS — E1140 Type 2 diabetes mellitus with diabetic neuropathy, unspecified: Secondary | ICD-10-CM | POA: Diagnosis present

## 2021-10-08 DIAGNOSIS — N1832 Chronic kidney disease, stage 3b: Secondary | ICD-10-CM | POA: Diagnosis present

## 2021-10-08 DIAGNOSIS — E785 Hyperlipidemia, unspecified: Secondary | ICD-10-CM | POA: Diagnosis present

## 2021-10-08 DIAGNOSIS — N179 Acute kidney failure, unspecified: Secondary | ICD-10-CM

## 2021-10-08 DIAGNOSIS — M25572 Pain in left ankle and joints of left foot: Secondary | ICD-10-CM | POA: Diagnosis not present

## 2021-10-08 DIAGNOSIS — Z853 Personal history of malignant neoplasm of breast: Secondary | ICD-10-CM | POA: Diagnosis not present

## 2021-10-08 DIAGNOSIS — Z79890 Hormone replacement therapy (postmenopausal): Secondary | ICD-10-CM

## 2021-10-08 DIAGNOSIS — E1122 Type 2 diabetes mellitus with diabetic chronic kidney disease: Secondary | ICD-10-CM | POA: Diagnosis present

## 2021-10-08 DIAGNOSIS — Z923 Personal history of irradiation: Secondary | ICD-10-CM

## 2021-10-08 DIAGNOSIS — J9610 Chronic respiratory failure, unspecified whether with hypoxia or hypercapnia: Secondary | ICD-10-CM | POA: Diagnosis present

## 2021-10-08 DIAGNOSIS — S93402A Sprain of unspecified ligament of left ankle, initial encounter: Secondary | ICD-10-CM | POA: Diagnosis present

## 2021-10-08 DIAGNOSIS — E039 Hypothyroidism, unspecified: Secondary | ICD-10-CM | POA: Diagnosis present

## 2021-10-08 DIAGNOSIS — Z794 Long term (current) use of insulin: Secondary | ICD-10-CM

## 2021-10-08 DIAGNOSIS — K8020 Calculus of gallbladder without cholecystitis without obstruction: Secondary | ICD-10-CM | POA: Diagnosis not present

## 2021-10-08 DIAGNOSIS — K8070 Calculus of gallbladder and bile duct without cholecystitis without obstruction: Principal | ICD-10-CM | POA: Diagnosis present

## 2021-10-08 DIAGNOSIS — Z79899 Other long term (current) drug therapy: Secondary | ICD-10-CM

## 2021-10-08 DIAGNOSIS — E861 Hypovolemia: Secondary | ICD-10-CM | POA: Diagnosis present

## 2021-10-08 DIAGNOSIS — X58XXXA Exposure to other specified factors, initial encounter: Secondary | ICD-10-CM | POA: Diagnosis present

## 2021-10-08 DIAGNOSIS — K8050 Calculus of bile duct without cholangitis or cholecystitis without obstruction: Secondary | ICD-10-CM | POA: Diagnosis not present

## 2021-10-08 DIAGNOSIS — Z6841 Body Mass Index (BMI) 40.0 and over, adult: Secondary | ICD-10-CM | POA: Diagnosis not present

## 2021-10-08 DIAGNOSIS — I130 Hypertensive heart and chronic kidney disease with heart failure and stage 1 through stage 4 chronic kidney disease, or unspecified chronic kidney disease: Secondary | ICD-10-CM | POA: Diagnosis present

## 2021-10-08 DIAGNOSIS — N183 Chronic kidney disease, stage 3 unspecified: Secondary | ICD-10-CM | POA: Diagnosis present

## 2021-10-08 DIAGNOSIS — Z823 Family history of stroke: Secondary | ICD-10-CM

## 2021-10-08 DIAGNOSIS — I951 Orthostatic hypotension: Secondary | ICD-10-CM | POA: Diagnosis present

## 2021-10-08 DIAGNOSIS — Z95810 Presence of automatic (implantable) cardiac defibrillator: Secondary | ICD-10-CM

## 2021-10-08 DIAGNOSIS — Z882 Allergy status to sulfonamides status: Secondary | ICD-10-CM

## 2021-10-08 DIAGNOSIS — Z8249 Family history of ischemic heart disease and other diseases of the circulatory system: Secondary | ICD-10-CM

## 2021-10-08 DIAGNOSIS — E860 Dehydration: Secondary | ICD-10-CM | POA: Diagnosis not present

## 2021-10-08 DIAGNOSIS — R1010 Upper abdominal pain, unspecified: Secondary | ICD-10-CM

## 2021-10-08 DIAGNOSIS — E871 Hypo-osmolality and hyponatremia: Secondary | ICD-10-CM | POA: Diagnosis present

## 2021-10-08 DIAGNOSIS — Z96651 Presence of right artificial knee joint: Secondary | ICD-10-CM | POA: Diagnosis present

## 2021-10-08 DIAGNOSIS — I5022 Chronic systolic (congestive) heart failure: Secondary | ICD-10-CM | POA: Diagnosis present

## 2021-10-08 DIAGNOSIS — F411 Generalized anxiety disorder: Secondary | ICD-10-CM | POA: Diagnosis present

## 2021-10-08 DIAGNOSIS — Z20822 Contact with and (suspected) exposure to covid-19: Secondary | ICD-10-CM | POA: Diagnosis present

## 2021-10-08 DIAGNOSIS — R0602 Shortness of breath: Secondary | ICD-10-CM | POA: Diagnosis not present

## 2021-10-08 DIAGNOSIS — Z9221 Personal history of antineoplastic chemotherapy: Secondary | ICD-10-CM

## 2021-10-08 DIAGNOSIS — K219 Gastro-esophageal reflux disease without esophagitis: Secondary | ICD-10-CM | POA: Diagnosis present

## 2021-10-08 DIAGNOSIS — E119 Type 2 diabetes mellitus without complications: Secondary | ICD-10-CM | POA: Diagnosis present

## 2021-10-08 DIAGNOSIS — Z91013 Allergy to seafood: Secondary | ICD-10-CM

## 2021-10-08 DIAGNOSIS — F410 Panic disorder [episodic paroxysmal anxiety] without agoraphobia: Secondary | ICD-10-CM | POA: Diagnosis not present

## 2021-10-08 DIAGNOSIS — R197 Diarrhea, unspecified: Secondary | ICD-10-CM | POA: Diagnosis not present

## 2021-10-08 DIAGNOSIS — R112 Nausea with vomiting, unspecified: Secondary | ICD-10-CM

## 2021-10-08 DIAGNOSIS — Z888 Allergy status to other drugs, medicaments and biological substances status: Secondary | ICD-10-CM

## 2021-10-08 DIAGNOSIS — Z87891 Personal history of nicotine dependence: Secondary | ICD-10-CM

## 2021-10-08 DIAGNOSIS — Z820 Family history of epilepsy and other diseases of the nervous system: Secondary | ICD-10-CM | POA: Diagnosis not present

## 2021-10-08 HISTORY — DX: Hypo-osmolality and hyponatremia: E87.1

## 2021-10-08 HISTORY — DX: Acute kidney failure, unspecified: N17.9

## 2021-10-08 HISTORY — DX: Nausea with vomiting, unspecified: R11.2

## 2021-10-08 LAB — CBC
HCT: 41 % (ref 36.0–46.0)
Hemoglobin: 13.5 g/dL (ref 12.0–15.0)
MCH: 32.3 pg (ref 26.0–34.0)
MCHC: 32.9 g/dL (ref 30.0–36.0)
MCV: 98.1 fL (ref 80.0–100.0)
Platelets: 260 10*3/uL (ref 150–400)
RBC: 4.18 MIL/uL (ref 3.87–5.11)
RDW: 12.4 % (ref 11.5–15.5)
WBC: 9.6 10*3/uL (ref 4.0–10.5)
nRBC: 0 % (ref 0.0–0.2)

## 2021-10-08 LAB — HEMOGLOBIN A1C
Hgb A1c MFr Bld: 6.9 % — ABNORMAL HIGH (ref 4.8–5.6)
Mean Plasma Glucose: 151.33 mg/dL

## 2021-10-08 LAB — D-DIMER, QUANTITATIVE: D-Dimer, Quant: 1.25 ug/mL-FEU — ABNORMAL HIGH (ref 0.00–0.50)

## 2021-10-08 LAB — APTT: aPTT: 26 seconds (ref 24–36)

## 2021-10-08 LAB — COMPREHENSIVE METABOLIC PANEL
ALT: 36 U/L (ref 0–44)
AST: 33 U/L (ref 15–41)
Albumin: 4.1 g/dL (ref 3.5–5.0)
Alkaline Phosphatase: 221 U/L — ABNORMAL HIGH (ref 38–126)
Anion gap: 13 (ref 5–15)
BUN: 36 mg/dL — ABNORMAL HIGH (ref 8–23)
CO2: 20 mmol/L — ABNORMAL LOW (ref 22–32)
Calcium: 9.6 mg/dL (ref 8.9–10.3)
Chloride: 100 mmol/L (ref 98–111)
Creatinine, Ser: 2.05 mg/dL — ABNORMAL HIGH (ref 0.44–1.00)
GFR, Estimated: 26 mL/min — ABNORMAL LOW (ref >60)
Glucose, Bld: 157 mg/dL — ABNORMAL HIGH (ref 70–99)
Potassium: 4.3 mmol/L (ref 3.5–5.1)
Sodium: 133 mmol/L — ABNORMAL LOW (ref 135–145)
Total Bilirubin: 0.5 mg/dL (ref 0.3–1.2)
Total Protein: 8.6 g/dL — ABNORMAL HIGH (ref 6.5–8.1)

## 2021-10-08 LAB — HIV ANTIBODY (ROUTINE TESTING W REFLEX): HIV Screen 4th Generation wRfx: NONREACTIVE

## 2021-10-08 LAB — TROPONIN I (HIGH SENSITIVITY)
Troponin I (High Sensitivity): 10 ng/L (ref <18)
Troponin I (High Sensitivity): 9 ng/L (ref <18)

## 2021-10-08 LAB — GLUCOSE, CAPILLARY
Glucose-Capillary: 119 mg/dL — ABNORMAL HIGH (ref 70–99)
Glucose-Capillary: 152 mg/dL — ABNORMAL HIGH (ref 70–99)

## 2021-10-08 LAB — BRAIN NATRIURETIC PEPTIDE: B Natriuretic Peptide: 49.7 pg/mL (ref 0.0–100.0)

## 2021-10-08 LAB — PROTIME-INR
INR: 1.2 (ref 0.8–1.2)
Prothrombin Time: 15.1 seconds (ref 11.4–15.2)

## 2021-10-08 LAB — TSH: TSH: 1.127 u[IU]/mL (ref 0.350–4.500)

## 2021-10-08 LAB — LIPASE, BLOOD: Lipase: 18 U/L (ref 11–51)

## 2021-10-08 MED ORDER — SODIUM CHLORIDE 0.9 % IV BOLUS
500.0000 mL | Freq: Once | INTRAVENOUS | Status: AC
  Administered 2021-10-08: 500 mL via INTRAVENOUS

## 2021-10-08 MED ORDER — HEPARIN SODIUM (PORCINE) 5000 UNIT/ML IJ SOLN
5000.0000 [IU] | Freq: Three times a day (TID) | INTRAMUSCULAR | Status: DC
  Administered 2021-10-08 – 2021-10-12 (×10): 5000 [IU] via SUBCUTANEOUS
  Filled 2021-10-08 (×10): qty 1

## 2021-10-08 MED ORDER — CLONAZEPAM 0.5 MG PO TABS
0.5000 mg | ORAL_TABLET | Freq: Every day | ORAL | Status: DC | PRN
  Administered 2021-10-09 – 2021-10-11 (×3): 0.5 mg via ORAL
  Filled 2021-10-08 (×3): qty 1

## 2021-10-08 MED ORDER — INSULIN ASPART 100 UNIT/ML IJ SOLN
2.0000 [IU] | Freq: Three times a day (TID) | INTRAMUSCULAR | Status: DC
  Administered 2021-10-11 (×2): 2 [IU] via SUBCUTANEOUS

## 2021-10-08 MED ORDER — MORPHINE SULFATE (PF) 2 MG/ML IV SOLN
2.0000 mg | INTRAVENOUS | Status: DC | PRN
  Filled 2021-10-08 (×2): qty 1

## 2021-10-08 MED ORDER — DICYCLOMINE HCL 20 MG PO TABS
20.0000 mg | ORAL_TABLET | Freq: Three times a day (TID) | ORAL | Status: DC
  Administered 2021-10-09 – 2021-10-12 (×10): 20 mg via ORAL
  Filled 2021-10-08 (×10): qty 1

## 2021-10-08 MED ORDER — PANTOPRAZOLE SODIUM 40 MG PO TBEC
40.0000 mg | DELAYED_RELEASE_TABLET | Freq: Every day | ORAL | Status: DC
  Administered 2021-10-08 – 2021-10-12 (×5): 40 mg via ORAL
  Filled 2021-10-08 (×5): qty 1

## 2021-10-08 MED ORDER — MIRTAZAPINE 30 MG PO TABS
30.0000 mg | ORAL_TABLET | Freq: Every day | ORAL | Status: DC
  Administered 2021-10-08 – 2021-10-11 (×4): 30 mg via ORAL
  Filled 2021-10-08 (×4): qty 1

## 2021-10-08 MED ORDER — ONDANSETRON HCL 4 MG/2ML IJ SOLN: 4.0000 mg | Freq: Four times a day (QID) | INTRAMUSCULAR | Status: DC | PRN

## 2021-10-08 MED ORDER — ACETAMINOPHEN 650 MG RE SUPP: 650.0000 mg | Freq: Four times a day (QID) | RECTAL | Status: DC | PRN

## 2021-10-08 MED ORDER — INSULIN ASPART 100 UNIT/ML IJ SOLN: 0.0000 [IU] | Freq: Every day | INTRAMUSCULAR | Status: DC

## 2021-10-08 MED ORDER — ACETAMINOPHEN 325 MG PO TABS
650.0000 mg | ORAL_TABLET | Freq: Four times a day (QID) | ORAL | Status: DC | PRN
  Administered 2021-10-11 – 2021-10-12 (×2): 650 mg via ORAL
  Filled 2021-10-08 (×2): qty 2

## 2021-10-08 MED ORDER — ONDANSETRON HCL 4 MG PO TABS: 4.0000 mg | ORAL_TABLET | Freq: Four times a day (QID) | ORAL | Status: DC | PRN

## 2021-10-08 MED ORDER — SODIUM CHLORIDE 0.9 % IV SOLN: INTRAVENOUS | Status: AC

## 2021-10-08 MED ORDER — SODIUM CHLORIDE 0.9 % IV BOLUS: 250.0000 mL | Freq: Once | INTRAVENOUS | Status: DC

## 2021-10-08 MED ORDER — MIDODRINE HCL 5 MG PO TABS
10.0000 mg | ORAL_TABLET | Freq: Three times a day (TID) | ORAL | Status: DC
  Administered 2021-10-09 – 2021-10-12 (×10): 10 mg via ORAL
  Filled 2021-10-08 (×11): qty 2

## 2021-10-08 MED ORDER — HYDROXYZINE HCL 10 MG/5ML PO SYRP
10.0000 mg | ORAL_SOLUTION | Freq: Three times a day (TID) | ORAL | Status: DC | PRN
  Filled 2021-10-08: qty 5

## 2021-10-08 MED ORDER — FLUDROCORTISONE ACETATE 0.1 MG PO TABS
100.0000 ug | ORAL_TABLET | Freq: Two times a day (BID) | ORAL | Status: DC
  Administered 2021-10-08 – 2021-10-12 (×8): 100 ug via ORAL
  Filled 2021-10-08 (×8): qty 1

## 2021-10-08 MED ORDER — CITALOPRAM HYDROBROMIDE 20 MG PO TABS
40.0000 mg | ORAL_TABLET | Freq: Every day | ORAL | Status: DC
  Administered 2021-10-08: 40 mg via ORAL
  Filled 2021-10-08 (×2): qty 2

## 2021-10-08 MED ORDER — PROCHLORPERAZINE EDISYLATE 10 MG/2ML IJ SOLN
10.0000 mg | Freq: Once | INTRAMUSCULAR | Status: AC
  Administered 2021-10-08: 10 mg via INTRAVENOUS
  Filled 2021-10-08: qty 2

## 2021-10-08 MED ORDER — LEVOTHYROXINE SODIUM 88 MCG PO TABS
88.0000 ug | ORAL_TABLET | Freq: Every day | ORAL | Status: DC
  Administered 2021-10-10 – 2021-10-12 (×3): 88 ug via ORAL
  Filled 2021-10-08 (×4): qty 1

## 2021-10-08 MED ORDER — INSULIN ASPART 100 UNIT/ML IJ SOLN
0.0000 [IU] | Freq: Three times a day (TID) | INTRAMUSCULAR | Status: DC
  Administered 2021-10-10: 1 [IU] via SUBCUTANEOUS
  Administered 2021-10-11: 2 [IU] via SUBCUTANEOUS
  Administered 2021-10-11 – 2021-10-12 (×2): 1 [IU] via SUBCUTANEOUS

## 2021-10-08 MED ORDER — CARVEDILOL 3.125 MG PO TABS
3.1250 mg | ORAL_TABLET | Freq: Two times a day (BID) | ORAL | Status: DC
  Administered 2021-10-08 – 2021-10-12 (×8): 3.125 mg via ORAL
  Filled 2021-10-08 (×8): qty 1

## 2021-10-08 NOTE — ED Provider Notes (Incomplete)
I provided a substantive portion of the care of this patient.  I personally performed the entirety of the history, exam, and medical decision making for this encounter. °{Remember to document shared critical care using "edcritical" dot phrase:1} °  ° °

## 2021-10-08 NOTE — ED Notes (Signed)
Attempt to call report to floor RN.  Unavailable.  States will call back ?

## 2021-10-08 NOTE — H&P (Signed)
? ? ?History and Physical ? ?Rhonda [name] YHC:[id number] DOB: Dec 15, 1950 DOA: 10/08/2021 ? ?PCP: [name], MD ?Patient coming from: Home ? ?I have personally briefly reviewed patient's old medical records in [place] ? ? ?Chief Complaint: Abdominal pain nausea and vomiting and dizziness upon standing ? ?HPI: Rhonda [name] is a 71 y.o. female past medical history of chronic systolic heart failure with an EF of 60% back in January felt sick to be secondary to viral cardiomyopathy dating back in 41 with normal cath in 2010, chronic kidney disease stage IIIb, morbid obesity, triple negative invasive ductal carcinoma status post chemo and radiation with last treatment in 2012,  with [employer] CRT device, she is followed by the heart failure clinic and nephrology she is currently on Florinef and midodrine for orthostatic hypotension, went to see her PCP as she had been having nausea vomiting and diarrhea with hypotension with dizziness was sent to the ED suffered a panic attack and left without being seen, she eventually went to [facility] long hospital had an abdominal ultrasound which showed cholelithiasis, with a follow-up with GI, she continued to have after this multiple episodes of nonbilious nonbloody emesis for the past several days that happened after trying to take her medication she has minimal oral intake over the last 5 days as she continues to endorse nausea even with p.o. ? ?In the ED: ?Found afebrile temperature of 98.2, blood cell count 9.6, hemoglobin of 13 creatinine of 2.0 (baseline 1.8-1.6) mild hyponatremia elevated alkaline phosphatase, T. bili was 0.5 normal LFTs and lipase.  She did have an abdominal ultrasound on 10/04/2021 that showed choledocholithiasis with moderate biliary ductal dilation both intrahepatic and extrahepatic, CT scan of the abdomen and pelvis showed choledocholithiasis without cholecystitis however, common bile duct again was dilated. ? ? ?Review of Systems:  All systems reviewed and apart from history of presenting illness, are negative. ? ?Past Medical History:  ?Diagnosis Date  ? Anemia   ? Arthritis   ? Right knee  ? Asthma   ? Back pain   ? Disk problem  ? Breast cancer, stage 1 ([place]) 03/26/2011  ? Left; completed chemotherapy and radiation treatments  ? Cardiomyopathy   ? Chronic respiratory failure ([place]) 09/14/2013  ? Chronic systolic heart failure ([place])   ? a) NICM b) ECHO (03/2013) EF 20-25% c) ECHO (09/2013) EF 45-50%, grade I DD  ? CKD (chronic kidney disease) stage 3, GFR 30-59 ml/min (HCC) 11/08/2018  ? 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 Complication of anesthesia   ? History of low blood pressure after surgery; attributed to lying flat  ? Diabetes mellitus   ? "diet controlled" (05/03/2013)  ? Exertional shortness of breath   ? Generalized anxiety disorder   ? GERD (gastroesophageal reflux disease)   ? Gout   ? Heart murmur   ? Hyperlipidemia   ? Hypertension   ? Hypokalemia 11/08/2018  ? Hypothyroidism   ? Left bundle branch block   ? s/p CRT-D (04/2013)  ? Major depressive disorder   ? Migraines   ? On home oxygen therapy   ? "2L suppose to be q night" (05/03/2013)  ? Orthostatic hypotension 07/28/2017  ? Peripheral neuropathy   ? Feet  ? SVD (spontaneous vaginal delivery)   ? x 2  ? Unspecified vitamin D deficiency 03/26/2011  ? Does not take meds  ? ?Past Surgical History:  ?Procedure Laterality Date  ? BI-VENTRICULAR IMPLANTABLE CARDIOVERTER DEFIBRILLATOR N/A 05/03/2013  ?  Procedure: BI-VENTRICULAR IMPLANTABLE CARDIOVERTER DEFIBRILLATOR  (CRT-D);  Surgeon: Evans Lance, MD;  Location[facility] CATH LAB;  Service: Cardiovascular;  Laterality: N/A;  ? BI-VENTRICULAR IMPLANTABLE CARDIOVERTER DEFIBRILLATOR  (CRT-D)  05/03/2013  ? MDT CRTD implanted by Dr Lovena Le for non ischemic cardiomyopathy  ? BIOPSY  05/31/2018  ? Procedure: BIOPSY;  Surgeon: Ronnette Juniper, MD;  Location: [facility];  Service: Gastroenterology;;  ? BIV ICD GENERATOR CHANGEOUT N/A  04/11/2020  ? Procedure: BIV ICD GENERATOR CHANGEOUT;  Surgeon: Evans Lance, MD;  Location: [facility];  Service: Cardiovascular;  Laterality: N/A;  ? BREAST LUMPECTOMY Left 07/28/2010  ? CARDIAC CATHETERIZATION  2010  ? NORMAL CORONARY ARTERIES  ? 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--11 SURGERIES    ? PT HAS REMOVABLE SPEECH BULB-TAKES IT OUT BEFORE HER SURGERY  ? COLONOSCOPY    ? COLONOSCOPY N/A 05/31/2018  ? Procedure: COLONOSCOPY;  Surgeon: Ronnette Juniper, MD;  Location: [facility];  Service: Gastroenterology;  Laterality: N/A;  ? HYSTEROSCOPY WITH D & C  01/20/2012  ? Procedure: DILATATION AND CURETTAGE /HYSTEROSCOPY;  Surgeon: Margarette Asal, MD;  Location: [facility];  Service: Gynecology;  Laterality: N/A;  with trueclear  ? PORT-A-CATH REMOVAL  09/23/2011  ? Procedure: REMOVAL PORT-A-CATH;  Surgeon: Rolm Bookbinder, MD;  Location: [facility];  Service: General;  Laterality: N/A;  Port Removal  ? PORTACATH PLACEMENT  2012  ? TONSILLECTOMY  1960's  ? TOTAL KNEE ARTHROPLASTY  05/10/2012  ? Procedure: TOTAL KNEE ARTHROPLASTY;  Surgeon: Mauri Pole, MD;  Location: [facility];  Service: Orthopedics;  Laterality: Right;  ? WISDOM TOOTH EXTRACTION    ? ?Social History:  reports that she quit smoking about 6 months ago. Her smoking use included cigarettes. She has a 2.60 pack-year smoking history. She has never used smokeless tobacco. She reports that she does not drink alcohol and does not use drugs. ? ? ?Allergies  ?Allergen Reactions  ? Ceftin Anaphylaxis  ?  Face and throat swell   ? Geodon [Ziprasidone Hcl] Hives  ? Lisinopril Other (See Comments)  ?  angioedema  ? Shellfish Allergy Other (See Comments)  ?  Gout exacerbation  ? Shellfish-Derived Products   ?  Other reaction(s): Other  ? Cefuroxime   ?  Other reaction(s): anaphylaxis  ? Sulfacetamide Sodium-Sulfur   ?  Other reaction(s): itch  ? Ziprasidone   ?  Other reaction(s): hives on left arm  ? Allopurinol Nausea Only and Other (See Comments)  ?  weakness  ?  Ativan [Lorazepam] Itching  ? Sulfa Antibiotics Itching  ? Ultram [Tramadol Hcl] Itching  ? Ultram [Tramadol] Itching  ? Valium [Diazepam] Other (See Comments)  ?  Patient states that diazepam doesn't relax, it has the opposite effect.  ? ? ?Family History  ?Problem Relation Age of Onset  ? Alzheimer's disease Mother   ? CVA Mother   ? Hypertension Mother   ? Cancer - Colon Father   ?     late 72  ? Birth defects Paternal Uncle   ? ? ?Prior to Admission medications   ?Medication Sig Start Date End Date Taking? Authorizing Provider  ?acetaZOLAMIDE (DIAMOX) 125 MG tablet Take 1 tablet (125 mg total) by mouth daily. 02/27/21  Yes Patel, Donika K, DO  ?carvedilol (COREG) 3.125 MG tablet TAKE 1 TABLET (3.125 MG) BY MOUTH TWICE DAILY WITH MEALS 10/01/21  Yes Joette Catching, PA-C  ?citalopram (CELEXA) 40 MG tablet Take 40 mg by mouth daily.  Yes [provider]  ?clonazePAM (KLONOPIN) 0.5 MG tablet Take 0.5 mg by mouth daily as needed for anxiety. 07/23/20  Yes [provider]  ?dicyclomine (BENTYL) 20 MG tablet Take 20 mg by mouth 3 (three) times daily before meals.   Yes [provider]  ?levothyroxine (SYNTHROID) 88 MCG tablet TAKE 1 TABLET(88 MCG) BY MOUTH DAILY BEFORE BREAKFAST 07/08/20  Yes Philemon Kingdom, MD  ?metolazone (ZAROXOLYN) 2.5 MG tablet Take 1 tablet by mouth every other Tuesday. 10/01/21  Yes Joette Catching, PA-C  ?midodrine (PROAMATINE) 10 MG tablet TAKE 1 TABLET THREE TIMES DAILY WITH MEALS 10/01/21  Yes Joette Catching, PA-C  ?mirtazapine (REMERON) 30 MG tablet Take 30 mg by mouth at bedtime.   Yes [provider]  ?montelukast (SINGULAIR) 10 MG tablet Take 10 mg by mouth at bedtime.   Yes [provider]  ?omeprazole (PRILOSEC) 40 MG capsule Take 40 mg by mouth daily.   Yes [provider]  ?potassium chloride SA (KLOR-CON M) 20 MEQ tablet Take 3 tablets (60 mEq total) by mouth 3 (three) times daily. 10/01/21  Yes Joette Catching, PA-C  ?simvastatin (ZOCOR) 10 MG tablet Take 1 tablet (10 mg total) by mouth every evening. 10/01/21  Yes Joette Catching, PA-C  ?spironolactone (ALDACTONE) 25 MG tablet Take 1 tablet (25 mg total

## 2021-10-08 NOTE — ED Provider Notes (Addendum)
[facility] EMERGENCY DEPT Provider Note   CSN: [id number] Arrival date & time: 10/08/21  1045     History CHF, DM, Anxiety  Chief Complaint  Patient presents with   Abdominal Pain    EDELIN [name] is a 71 y.o. female.  71 y.o female with a PMH HTN, CHD due to cardiomyopathy, pacemaker, diabetic neuropathy presents to the ED send in by Dr. [name] at [facility]. Patient has been experiencing these symptoms for the past week, evaluated first at [facility] PCP about a week ago, since then for a checkup, found to be hypotensive and dizzy at the office sent to [facility].  She did suffer a panic attack then, therefore she without being seen.  She was subsequently evaluated at [facility], she did have an ultrasound which showed cholelithiasis, patient was discharged with outpatient follow-up to GI.  Patient continues to have multiple episodes of nonbilious, nonbloody emesis for the past several days, her last episode occurring last night after trying to take her medication.  She has tried some p.o. intake including liquids such as chicken noodle but reports immediately vomiting after this.  She continues to endorse nausea throughout the day, has not taken anything to improve her symptoms.  Now complains of a headache which she took NyQuil for in order to help her sleep last night.  Patient does have a pacemaker in place, has not been able to take any of her medications at home for the last several days as she continues to have emesis.  Cording to husband, he is also concerned as she continues to endorse shortness of breath, this occurs while patient is taking a shower, while patient was ambulated in one place to another.  No fever, no chills, no prior hx of blood clots, no chest pain or urinary symptoms.    The history is provided by the patient and medical records.  Abdominal Pain Pain location:  LUQ, RUQ and epigastric Pain quality: bloating and fullness   Pain radiates to:   Does not radiate Pain severity:  Moderate Onset quality:  Gradual Duration:  1 week Timing:  Intermittent Progression:  Worsening Chronicity:  New Context: not alcohol use, not previous surgeries, not recent illness, not recent sexual activity and not suspicious food intake   Relieved by:  Nothing Worsened by:  Nothing Associated symptoms: diarrhea, nausea, shortness of breath and vomiting   Associated symptoms: no chest pain, no chills, no fever and no sore throat   Risk factors: being elderly   Risk factors: has not had multiple surgeries and no recent hospitalization       Home Medications Prior to Admission medications   Medication Sig Start Date End Date Taking? Authorizing Provider  acetaZOLAMIDE (DIAMOX) 125 MG tablet Take 1 tablet (125 mg total) by mouth daily. 02/27/21  Yes Patel, Donika K, DO  carvedilol (COREG) 3.125 MG tablet TAKE 1 TABLET (3.125 MG) BY MOUTH TWICE DAILY WITH MEALS 10/01/21  Yes Joette Catching, PA-C  citalopram (CELEXA) 40 MG tablet Take 40 mg by mouth daily.   Yes [provider]  clonazePAM (KLONOPIN) 0.5 MG tablet Take 0.5 mg by mouth daily as needed for anxiety. 07/23/20  Yes [provider]  dicyclomine (BENTYL) 20 MG tablet Take 20 mg by mouth 3 (three) times daily before meals.   Yes [provider]  levothyroxine (SYNTHROID) 88 MCG tablet TAKE 1 TABLET(88 MCG) BY MOUTH DAILY BEFORE BREAKFAST 07/08/20  Yes Philemon Kingdom, MD  metolazone (ZAROXOLYN)  2.5 MG tablet Take 1 tablet by mouth every other Tuesday. 10/01/21  Yes Joette Catching, PA-C  midodrine (PROAMATINE) 10 MG tablet TAKE 1 TABLET THREE TIMES DAILY WITH MEALS 10/01/21  Yes Joette Catching, PA-C  mirtazapine (REMERON) 30 MG tablet Take 30 mg by mouth at bedtime.   Yes [provider]  montelukast (SINGULAIR) 10 MG tablet Take 10 mg by mouth at bedtime.   Yes [provider]  omeprazole (PRILOSEC) 40 MG capsule Take 40 mg by mouth  daily.   Yes [provider]  potassium chloride SA (KLOR-CON M) 20 MEQ tablet Take 3 tablets (60 mEq total) by mouth 3 (three) times daily. 10/01/21  Yes Joette Catching, PA-C  simvastatin (ZOCOR) 10 MG tablet Take 1 tablet (10 mg total) by mouth every evening. 10/01/21  Yes Joette Catching, PA-C  spironolactone (ALDACTONE) 25 MG tablet Take 1 tablet (25 mg total) by mouth every evening. Needs appt for further refills 10/01/21  Yes Joette Catching, PA-C  torsemide (DEMADEX) 20 MG tablet Take 4 tablets (80 mg total) by mouth every morning AND 3 tablets (60 mg total) every evening. 10/01/21  Yes Joette Catching, PA-C  fludrocortisone (FLORINEF) 0.1 MG tablet Take 1 tablet (100 mcg total) by mouth 2 (two) times daily. Patient not taking: Reported on 08/19/2021 02/27/21   Narda Amber K, DO  gabapentin (NEURONTIN) 300 MG capsule Take 1 capsule by mouth 3 (three) times daily.    [provider]  gabapentin (NEURONTIN) 800 MG tablet TAKE 1 TABLET AT BEDTIME AS NEEDED FOR NEUROPATHY 08/13/21   Narda Amber K, DO      Allergies    Ceftin, Geodon [ziprasidone hcl], Lisinopril, Shellfish allergy, Shellfish-derived products, Cefuroxime, Sulfacetamide sodium-sulfur, Ziprasidone, Allopurinol, Ativan [lorazepam], Sulfa antibiotics, Ultram [tramadol hcl], Ultram [tramadol], and Valium [diazepam]    Review of Systems   Review of Systems  Constitutional:  Negative for chills and fever.  HENT:  Negative for sore throat.   Respiratory:  Positive for shortness of breath.   Cardiovascular:  Negative for chest pain.  Gastrointestinal:  Positive for abdominal pain, diarrhea, nausea and vomiting.  Genitourinary:  Negative for flank pain.  Musculoskeletal:  Negative for back pain.  Neurological:  Negative for light-headedness and headaches.  All other systems reviewed and are negative.  Physical Exam Updated Vital Signs BP 123/61   Pulse 68   Temp 98.1 F (36.7 C)   Resp  (!) 21   SpO2 100%  Physical Exam Vitals and nursing note reviewed.  Constitutional:      Appearance: She is well-developed. She is ill-appearing.  HENT:     Head: Normocephalic and atraumatic.  Cardiovascular:     Rate and Rhythm: Normal rate.  Pulmonary:     Effort: Pulmonary effort is normal.     Breath sounds: No wheezing or rales.  Abdominal:     General: Abdomen is flat. Bowel sounds are normal.     Palpations: Abdomen is soft.     Tenderness: There is abdominal tenderness in the right upper quadrant, epigastric area and left upper quadrant. There is guarding.  Skin:    General: Skin is warm and dry.  Neurological:     Mental Status: She is alert and oriented to person, place, and time.    ED Results / Procedures / Treatments   Labs (all labs ordered are listed, but only abnormal results are displayed) Labs Reviewed  COMPREHENSIVE METABOLIC PANEL - Abnormal; Notable for the  following components:      Result Value   Sodium 133 (*)    CO2 20 (*)    Glucose, Bld 157 (*)    BUN 36 (*)    Creatinine, Ser 2.05 (*)    Total Protein 8.6 (*)    Alkaline Phosphatase 221 (*)    GFR, Estimated 26 (*)    All other components within normal limits  D-DIMER, QUANTITATIVE - Abnormal; Notable for the following components:   D-Dimer, Quant 1.25 (*)    All other components within normal limits  LIPASE, BLOOD  CBC  BRAIN NATRIURETIC PEPTIDE  TROPONIN I (HIGH SENSITIVITY)  TROPONIN I (HIGH SENSITIVITY)    EKG EKG Interpretation  Date/Time:  Wednesday Oct 08 2021 11:01:25 EDT Ventricular Rate:  83 PR Interval:  158 QRS Duration: 136 QT Interval:  420 QTC Calculation: 493 R Axis:   194 Text Interpretation: Atrial-sensed ventricular-paced rhythm with Fusion complexes Abnormal ECG When compared with ECG of 06-Oct-2021 17:04, PREVIOUS ECG IS PRESENT Confirmed by Elnora Morrison [id number]) on 10/08/2021 2:43:25 PM  Radiology CT Abdomen Pelvis Wo Contrast  Result Date:  10/06/2021 CLINICAL DATA:  Acute generalized abdominal pain. EXAM: CT ABDOMEN AND PELVIS WITHOUT CONTRAST TECHNIQUE: Multidetector CT imaging of the abdomen and pelvis was performed following the standard protocol without IV contrast. RADIATION DOSE REDUCTION: This exam was performed according to the departmental dose-optimization program which includes automated exposure control, adjustment of the mA and/or kV according to patient size and/or use of iterative reconstruction technique. COMPARISON:  May 30, 2018.  August 06, 2018. FINDINGS: Lower chest: No acute abnormality. Hepatobiliary: Mild cholelithiasis is noted. Liver is unremarkable on these unenhanced images. No intrahepatic biliary dilatation is noted. However, dilated common bile duct is noted which was present on prior exam 2020. Distal common bile duct obstruction cannot be excluded. Pancreas: Unremarkable. No pancreatic ductal dilatation or surrounding inflammatory changes. Spleen: Normal in size without focal abnormality. Adrenals/Urinary Tract: Adrenal glands are unremarkable. Kidneys are normal, without renal calculi, focal lesion, or hydronephrosis. Bladder is unremarkable. Stomach/Bowel: Stomach is within normal limits. Appendix appears normal. No evidence of bowel wall thickening, distention, or inflammatory changes. Vascular/Lymphatic: Aortic atherosclerosis. No enlarged abdominal or pelvic lymph nodes. Reproductive: Stable posterior fibroid noted on prior exam. No adnexal abnormality is noted. Other: Small fat containing periumbilical hernia is noted. No ascites is noted. Musculoskeletal: No acute or significant osseous findings. IMPRESSION: Cholelithiasis is noted without evidence of cholecystitis. However, dilated common bile duct is again noted which was present on prior exam of 2020, although no intrahepatic biliary dilatation is noted. Correlation with liver function tests is recommended to rule out distal common bile duct obstruction.  Uterine fibroid is again noted. Small fat containing periumbilical hernia. Aortic Atherosclerosis (ICD10-I70.0). Electronically Signed   By: Marijo Conception M.D.   On: 10/06/2021 17:42   US Abdomen Limited  Result Date: 10/06/2021 CLINICAL DATA:  RIGHT upper quadrant pain in a 71 year old female. EXAM: ULTRASOUND ABDOMEN LIMITED RIGHT UPPER QUADRANT COMPARISON:  Comparison made with Oct 06, 2018 and CT of the abdomen and pelvis dated Oct 06, 2021. FINDINGS: Gallbladder: Sludge and stones within the gallbladder lumen. No reported tenderness over the gallbladder during the examination. No signs of wall thickening. No signs of pericholecystic fluid. Gallstones and an area suspected to represent tumefactive sludge are large, largest 1.6 cm. Some of these are in the neck of the gallbladder. Common bile duct: Diameter: 8.9 mm Signs of intrahepatic biliary duct distension. Liver: No visible  intrahepatic lesion. Hepatic echogenicity is grossly normal on limited assessment due to patient body habitus. Portal vein is patent on color Doppler imaging with normal direction of blood flow towards the liver. Other: None. IMPRESSION: Cholelithiasis with no reported tenderness over the gallbladder but with moderate biliary duct distension both intra and extrahepatic suspected. Choledocholithiasis is not excluded. MRI/MRCP may be helpful as warranted for further assessment. Electronically Signed   By: Zetta Bills M.D.   On: 10/06/2021 18:47   DG Chest Portable 1 View  Result Date: 10/08/2021 CLINICAL DATA:  sob EXAM: PORTABLE CHEST 1 VIEW COMPARISON:  None Available. FINDINGS: LEFT-sided pacer overlies normal cardiac silhouette. No effusion, infiltrate, or pneumothorax. No acute osseous abnormality. IMPRESSION: No acute cardiopulmonary process. Electronically Signed   By: Suzy Bouchard M.D.   On: 10/08/2021 13:10   DG Chest Port 1 View  Result Date: 10/06/2021 CLINICAL DATA:  Shortness of breath. EXAM: PORTABLE CHEST  1 VIEW COMPARISON:  Chest x-ray 10/05/2021 FINDINGS: Left-sided ICD again seen. The heart is enlarged, unchanged. Left axillary surgical clips are again seen. Both lungs are clear. The visualized skeletal structures are unremarkable. IMPRESSION: 1. Stable cardiomegaly. 2. No acute cardiopulmonary process. Electronically Signed   By: Ronney Asters M.D.   On: 10/06/2021 17:24    Procedures Procedures    Medications Ordered in ED Medications  prochlorperazine (COMPAZINE) injection 10 mg (10 mg Intravenous Given 10/08/21 1310)  sodium chloride 0.9 % bolus 500 mL (500 mLs Intravenous New Bag/Given 10/08/21 1310)    ED Course/ Medical Decision Making/ A&P                           Medical Decision Making Patient is here for her third visit to the ED in the past week with similar symptoms such as nausea, vomiting, diarrhea, dizziness.  Sent in today by [facility] physicians.  Ultrasound 2 days ago did reveal some gallstones with the largest being 1.6 cm at the neck of the gallbladder.  There is some concern for choledocholithiasis.  In addition patient found to be hypotensive on one of the visits at [facility] urgent care.  Continues to have decreased p.o. intake, would benefit from MRCP.  In addition, having shortness of breath while showering, while moving around.  Underlying CHF, but no signs of fluid overload on my exam.   X-ray without any signs of pneumonia, no signs of acute pathology.  Dimer level is elevated at 1.25, however due to patient's trending up of creatinine unable to obtain CT angio on today's visit, will likely need VQ scan while on admission. After speaking to [facility], patient will require hospitalization for further evaluation.  Will call hospitalist service in order to have patient admitted at [facility] long. Due to ongoing shortness of breath cannot rule out pulmonary embolism, will require further testing with VQ scan.   Amount and/or Complexity of Data Reviewed Labs: ordered.    Details:  Elevated Alk phos consitent with prior baseline. LFTs are unremarkable. Creatine/BUN elevated consisten wtih dehydration, given 569m bous for symptomatic improvement.Elevated Dimer, in the setting of SOB with ADLS need to cosinder PE. Radiology: ordered and independent interpretation performed.    Details: Chest xray with no acute findings, no pulmonary edema or signs of infiltrates. ECG/medicine tests: ordered.    Details: EKG is abnormal paced, rate 83. Discussion of management or test interpretation with external provider(s): 2:03 PM Spoke to Dr. [facility]Paulita Fuj[facility]of [facility] who is agreeable of  seeing patient while in the hospital.    Risk Prescription drug management. Decision regarding hospitalization.   Patient here sent in by PCP at [facility] concern of dehydration along with choledocholithiasis, this is patients third visit to the ED. She is now unable to take her home meds.  Patient did have an ultrasound 2 days ago which showed multiple stones with the largest one being 1.6 cm.  Husband is concerned as patient is unable to take any of her medications that she does have an ongoing history of CHF, diabetes.  On exam patient appears nontoxic, does appear ill-appearing, does appear dry.  Abdomen is tender to palpation throughout the entire upper area specifically the right upper quadrant and left upper quadrant.  No CVA tenderness bilaterally.  Bowel sounds are normal.  Signs of fluid overload on my exam.  Labs with a CMP with some decreased sodium, creatinine continues to trend up at 2.05 from her previous levels.  BUN is also elevated.  LFTs are within normal limits, however alk phos continues to be elevated at 221.  CBC with no leukocytosis, hemoglobin is within normal limits.  Lipase level is normal.  Continues to be concern for shortness of breath that patient experiences while taking a shower, will obtain portable chest.  2:39 PM Spoke to Dr. Sloan Leiter who will admit patient for further management .   Informed husband at the bedside of admission, he is agreeable to plan.    Portions of this note were generated with [employer]. Dictation errors may occur despite best attempts at proofreading.   Final Clinical Impression(s) / ED Diagnoses Final diagnoses:  Nausea vomiting and diarrhea  Dehydration  Upper abdominal pain    Rx / DC Orders ED Discharge Orders     None         Janeece Fitting, PA-C 10/08/21 [place], Mccade Sullenberger, PA-C 10/08/21 1457    Elnora Morrison, MD 10/11/21 [phone]

## 2021-10-08 NOTE — ED Notes (Signed)
[facility] at bedside Handoff report given 

## 2021-10-08 NOTE — ED Triage Notes (Signed)
Pt presents POV from [facility] for dizziness, N/V/D and headaches x1 week. Pt seen at [facility] ED 2 days ago for the same, [facility] ED 3 days ago, LWBS.  ? ?[facility] suggests IV fluids, pain control, further imaging and possible admission  ?

## 2021-10-08 NOTE — ED Notes (Signed)
Handoff report given to Mickel Baas RN on [place] at [employer]  ?

## 2021-10-09 DIAGNOSIS — E871 Hypo-osmolality and hyponatremia: Secondary | ICD-10-CM

## 2021-10-09 DIAGNOSIS — J9610 Chronic respiratory failure, unspecified whether with hypoxia or hypercapnia: Secondary | ICD-10-CM

## 2021-10-09 DIAGNOSIS — R197 Diarrhea, unspecified: Secondary | ICD-10-CM

## 2021-10-09 DIAGNOSIS — N179 Acute kidney failure, unspecified: Secondary | ICD-10-CM | POA: Diagnosis not present

## 2021-10-09 DIAGNOSIS — Z6841 Body Mass Index (BMI) 40.0 and over, adult: Secondary | ICD-10-CM | POA: Diagnosis not present

## 2021-10-09 DIAGNOSIS — R112 Nausea with vomiting, unspecified: Secondary | ICD-10-CM | POA: Diagnosis not present

## 2021-10-09 DIAGNOSIS — Z95810 Presence of automatic (implantable) cardiac defibrillator: Secondary | ICD-10-CM | POA: Diagnosis not present

## 2021-10-09 LAB — CBC
HCT: 33.9 % — ABNORMAL LOW (ref 36.0–46.0)
Hemoglobin: 11.6 g/dL — ABNORMAL LOW (ref 12.0–15.0)
MCH: 34.8 pg — ABNORMAL HIGH (ref 26.0–34.0)
MCHC: 34.2 g/dL (ref 30.0–36.0)
MCV: 101.8 fL — ABNORMAL HIGH (ref 80.0–100.0)
Platelets: 194 10*3/uL (ref 150–400)
RBC: 3.33 MIL/uL — ABNORMAL LOW (ref 3.87–5.11)
RDW: 12.5 % (ref 11.5–15.5)
WBC: 8.1 10*3/uL (ref 4.0–10.5)
nRBC: 0 % (ref 0.0–0.2)

## 2021-10-09 LAB — COMPREHENSIVE METABOLIC PANEL
ALT: 30 U/L (ref 0–44)
AST: 27 U/L (ref 15–41)
Albumin: 3 g/dL — ABNORMAL LOW (ref 3.5–5.0)
Alkaline Phosphatase: 186 U/L — ABNORMAL HIGH (ref 38–126)
Anion gap: 7 (ref 5–15)
BUN: 33 mg/dL — ABNORMAL HIGH (ref 8–23)
CO2: 20 mmol/L — ABNORMAL LOW (ref 22–32)
Calcium: 8.5 mg/dL — ABNORMAL LOW (ref 8.9–10.3)
Chloride: 109 mmol/L (ref 98–111)
Creatinine, Ser: 1.47 mg/dL — ABNORMAL HIGH (ref 0.44–1.00)
GFR, Estimated: 39 mL/min — ABNORMAL LOW (ref >60)
Glucose, Bld: 113 mg/dL — ABNORMAL HIGH (ref 70–99)
Potassium: 3.8 mmol/L (ref 3.5–5.1)
Sodium: 136 mmol/L (ref 135–145)
Total Bilirubin: 0.7 mg/dL (ref 0.3–1.2)
Total Protein: 6.7 g/dL (ref 6.5–8.1)

## 2021-10-09 LAB — GLUCOSE, CAPILLARY
Glucose-Capillary: 141 mg/dL — ABNORMAL HIGH (ref 70–99)
Glucose-Capillary: 152 mg/dL — ABNORMAL HIGH (ref 70–99)
Glucose-Capillary: 131 mg/dL — ABNORMAL HIGH (ref 70–99)
Glucose-Capillary: 107 mg/dL — ABNORMAL HIGH (ref 70–99)

## 2021-10-09 LAB — HEMOGLOBIN A1C
Hgb A1c MFr Bld: 7 % — ABNORMAL HIGH (ref 4.8–5.6)
Mean Plasma Glucose: 154.2 mg/dL

## 2021-10-09 MED ORDER — CITALOPRAM HYDROBROMIDE 20 MG PO TABS
20.0000 mg | ORAL_TABLET | Freq: Every day | ORAL | Status: DC
  Administered 2021-10-10 – 2021-10-12 (×3): 20 mg via ORAL
  Filled 2021-10-09 (×3): qty 1

## 2021-10-09 NOTE — Progress Notes (Signed)
[facility] PROGRESS NOTE    Progress Note  CACHE DECOURSEY  VPX:[id number] DOB: [birth year] DOA: 10/08/2021 PCP: [name], MD     Brief Narrative:   Rhonda [name] is an 71 y.o. 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  MRCP was ordered, GI has been consulted.  Assessment/Plan:   Intractable nausea and vomiting possibly due to choledocholithiasis: Tmax of 98.2, leukocytosis this morning is 8.1. MRCP is pending Awaiting GI further recommendations  Acute kidney injury on chronic systolic heart failure 3A: Diuretic therapy was held.  Baseline creatinine 1.8-1.6. She was continued on Coreg midodrine and Florinef. She was started on IV fluids and her creatinine this morning is 1.4.  Chronic systolic heart failure: Continue to hold diuretic therapy continue Coreg.  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  Hypothyroidism: Continue Synthroid.  Hypovolemic hyponatremia: Resolved with fluid resuscitation.  Orthostatic hypotension: Midodrine was resumed and recheck orthostatics now that she has been fluid resuscitated.  Diabetes mellitus with neuropathy (HCC) Continue Neurontin.  Body mass index (BMI) 45.0-49.9, adult ([place]) Noted counseling.  Personal history of malignant neoplasm of breast History of triple negative breast cancer in  1999.   DVT prophylaxis: lovenox Family Communication:Husband Status is: Inpatient Remains inpatient appropriate because: Nausea and vomiting possibly due to choledocholithiasis    Code Status:     Code Status Orders  (From admission, onward)           Start     Ordered   10/08/21 1724  Full code  Continuous        10/08/21 1724           Code Status History     Date Active Date Inactive Code Status Order ID Comments User Context   04/11/2020 1114 04/11/2020 1845 Full Code [id number]  Evans Lance, MD Inpatient   11/08/2018 1757 11/10/2018 1926 Full Code [id number]  Karmen Bongo, MD ED   05/03/2013 1844 05/04/2013 1350 Full Code [id number]  Evans Lance, MD Inpatient   05/10/2012 1557 05/13/2012 1502 Full Code [id number]  Crutchfield, Antony Haste, RN Inpatient         IV Access:   Peripheral IV   Procedures and diagnostic studies:   DG Chest Portable 1 View  Result Date: 10/08/2021 CLINICAL DATA:  sob EXAM: PORTABLE CHEST 1 VIEW COMPARISON:  None Available. FINDINGS: LEFT-sided pacer overlies normal cardiac silhouette. No effusion, infiltrate, or pneumothorax. No acute osseous abnormality. IMPRESSION: No acute cardiopulmonary process. Electronically Signed   By: Suzy Bouchard M.D.   On: 10/08/2021 13:10     Medical Consultants:   None.   Subjective:    Rhonda [name] relates nausea is better she was able to tolerate her diet yesterday.  Objective:    Vitals:   10/08/21 1736 10/08/21 2203 10/09/21 0212 10/09/21 0624  BP: (!) 130/105 112/62 118/64 (!) 111/59  Pulse: 70 80 70  74  Resp: '18  18 18  '$ Temp: 97.6 F (36.4 C) 98.4 F (36.9 C) 97.9 F (36.6 C) 98.9 F (37.2 C)  TempSrc: Oral Oral Oral Oral  SpO2: 97% 99% 94% 95%   SpO2: 95 %   Intake/Output Summary (Last 24 hours) at 10/09/2021 1000 Last data filed at 10/09/2021 9983 Gross per 24 hour  Intake 995.58 ml  Output --  Net 995.58 ml   There were no vitals filed for this  visit.  Exam: General exam: In no acute distress. Respiratory system: Good air movement and clear to auscultation. Cardiovascular system: S1 & S2 heard, RRR. No JVD. Gastrointestinal system: Abdomen is nondistended, soft and nontender.  Extremities: No pedal edema. Skin: No rashes, lesions or ulcers Psychiatry: Judgement and insight appear normal. Mood & affect appropriate.    Data Reviewed:    Labs: Basic Metabolic Panel: Recent Labs  Lab 10/05/21 2227 10/06/21 1354 10/08/21 1103 10/09/21 0543  NA 134* 134* 133* 136  K 3.6 5.0 4.3 3.8  CL 101 99 100 109  CO2 22 23 20* 20*  GLUCOSE 166* 166* 157* 113*  BUN 34* 42* 36* 33*  CREATININE 1.78* 2.04* 2.05* 1.47*  CALCIUM 9.9 9.9 9.6 8.5*   GFR Estimated Creatinine Clearance: 37.9 mL/min (A) (by C-G formula based on SCr of 1.47 mg/dL (H)). Liver Function Tests: Recent Labs  Lab 10/05/21 2227 10/06/21 1354 10/08/21 1103 10/09/21 0543  AST 31 33 33 27  ALT 39 36 36 30  ALKPHOS 259* 242* 221* 186*  BILITOT 1.0 0.8 0.5 0.7  PROT 9.2* 9.4* 8.6* 6.7  ALBUMIN 3.7 4.1 4.1 3.0*   Recent Labs  Lab 10/05/21 2227 10/06/21 1354 10/08/21 1103  LIPASE '25 31 18   '$ No results for input(s): AMMONIA in the last 168 hours. Coagulation profile Recent Labs  Lab 10/08/21 1758  INR 1.2   COVID-19 Labs  Recent Labs    10/08/21 1102  DDIMER 1.25*    Lab Results  Component Value Date   SARSCOV2NAA NEGATIVE 10/06/2021   [place] NEGATIVE 04/09/2020   [place] Not Detected 03/15/2020   [place] Not Detected 06/08/2019    CBC: Recent Labs  Lab 10/05/21 2227 10/06/21 1354 10/08/21 1103 10/09/21 0543  WBC 12.5* 12.7* 9.6 8.1  NEUTROABS  --  9.9*  --   --   HGB 14.6 14.8 13.5 11.6*  HCT 43.8 43.8 41.0 33.9*  MCV 98.9 100.0 98.1 101.8*  PLT 327 308 260 194   Cardiac Enzymes: No results for input(s): CKTOTAL, CKMB, CKMBINDEX, TROPONINI in the last 168 hours. BNP (last 3 results) No results for input(s):  PROBNP in the last 8760 hours. CBG: Recent Labs  Lab 10/08/21 1749 10/08/21 2203 10/09/21 0723  GLUCAP 119* 152* 141*   D-Dimer: Recent Labs    10/08/21 1102  DDIMER 1.25*   Hgb A1c: Recent Labs    10/08/21 1758  HGBA1C 6.9*   Lipid Profile: No results for input(s): CHOL, HDL, LDLCALC, TRIG, CHOLHDL, LDLDIRECT in the last 72 hours. Thyroid function studies: Recent Labs    10/08/21 1758  TSH 1.127   Anemia work up: No results for input(s): VITAMINB12, FOLATE, FERRITIN, TIBC, IRON, RETICCTPCT in the last 72 hours. Sepsis Labs: Recent Labs  Lab 10/05/21 2227 10/06/21 1354 10/08/21 1103 10/09/21 0543  WBC 12.5* 12.7* 9.6 8.1   Microbiology Recent Results (from the past 240 hour(s))  Resp Panel by RT-PCR (Flu A&B, Covid) Nasopharyngeal Swab     Status: None  Collection Time: 10/06/21  5:48 PM   Specimen: Nasopharyngeal Swab; Nasopharyngeal(NP) swabs in vial transport medium  Result Value Ref Range Status   SARS Coronavirus 2 by RT PCR NEGATIVE NEGATIVE Final    Comment: (NOTE) SARS-CoV-2 target nucleic acids are NOT DETECTED.  The SARS-CoV-2 RNA is generally detectable in upper respiratory specimens during the acute phase of infection. The lowest concentration of SARS-CoV-2 viral copies this assay can detect is 138 copies/mL. A negative result does not preclude SARS-Cov-2 infection and should not be used as the sole basis for treatment or other patient management decisions. A negative result may occur with  improper specimen collection/handling, submission of specimen other than nasopharyngeal swab, presence of viral mutation(s) within the areas targeted by this assay, and inadequate number of viral copies(<138 copies/mL). A negative result must be combined with clinical observations, patient history, and epidemiological information. The expected result is Negative.  Fact Sheet for Patients:  EntrepreneurPulse.com.au  Fact Sheet for  Healthcare Providers:  IncredibleEmployment.be  This test is no t yet approved or cleared by the [place] FDA and  has been authorized for detection and/or diagnosis of SARS-CoV-2 by FDA under an Emergency Use Authorization (EUA). This EUA will remain  in effect (meaning this test can be used) for the duration of the COVID-19 declaration under Section 564(b)(1) of the Act, 21 U.S.C.section 360bbb-3(b)(1), unless the authorization is terminated  or revoked sooner.       Influenza A by PCR NEGATIVE NEGATIVE Final   Influenza B by PCR NEGATIVE NEGATIVE Final    Comment: (NOTE) The Xpert Xpress SARS-CoV-2/FLU/RSV plus assay is intended as an aid in the diagnosis of influenza from Nasopharyngeal swab specimens and should not be used as a sole basis for treatment. Nasal washings and aspirates are unacceptable for Xpert Xpress SARS-CoV-2/FLU/RSV testing.  Fact Sheet for Patients: EntrepreneurPulse.com.au  Fact Sheet for Healthcare Providers: IncredibleEmployment.be  This test is not yet approved or cleared by the [place] FDA and has been authorized for detection and/or diagnosis of SARS-CoV-2 by FDA under an Emergency Use Authorization (EUA). This EUA will remain in effect (meaning this test can be used) for the duration of the COVID-19 declaration under Section 564(b)(1) of the Act, 21 U.S.C. section 360bbb-3(b)(1), unless the authorization is terminated or revoked.  Performed at [facility], [place] [address], [place], [place] [address]      Medications:    carvedilol  3.125 mg Oral BID WC   citalopram  40 mg Oral Daily   dicyclomine  20 mg Oral TID AC   fludrocortisone  100 mcg Oral BID   heparin  5,000 Units Subcutaneous Q8H   insulin aspart  0-5 Units Subcutaneous QHS   insulin aspart  0-9 Units Subcutaneous TID WC   insulin aspart  2 Units Subcutaneous TID WC   levothyroxine  88 mcg  Oral Q0600   midodrine  10 mg Oral TID WC   mirtazapine  30 mg Oral QHS   pantoprazole  40 mg Oral Daily   Continuous Infusions:  sodium chloride 100 mL/hr at 10/09/21 0613      LOS: 1 day   Charlynne Cousins  [facility]  10/09/2021, 10:00 AM

## 2021-10-09 NOTE — Consult Note (Signed)
Referring Provider: [facility] Primary Care Physician:  [name], MD Primary Gastroenterologist:  [facility]/ Dr. Therisa Doyne  Reason for Consultation: Abdominal pain, nausea, vomiting  HPI: Rhonda [name] is a 71 y.o. female  past medical history of chronic systolic heart failure with an EF of 60%, chronic kidney disease stage IIIb, morbid obesity, triple negative invasive ductal carcinoma status post chemo and radiation with last treatment in 2012,  with [employer] CRT device who presented to the Ed from PCP clinic with nausea, vomiting hypotension and diarrhea.   Patient's husband reports she has been having increased abdominal pain for the last 3 weeks.  Notes in the past week it is worsened.  On Saturday she began to feel short of breath with headaches and decreased p.o. intake.  She was advised to go to the ED at her PCP visit. She left ED without being seen due to a panic attack.  She then returned to [facility] for further evaluation.  Patient husband gives the majority of history.  He notes she had had multiple episodes of nonbloody emesis for the past several days as well as nausea and increasing abdominal pain.  Colonoscopy 05/31/2018 746 mm polyps in the descending colon transverse colon removed piecemeal, biopsies taken for microscopic colitis.  7 tubular adenomas no microscopic colitis repeat colon in 3 years. No previous EGD.   Recently quit smoking 6 months ago denies alcohol and drug use.   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    Complication of anesthesia    History of low blood pressure after surgery; attributed to lying flat   Diabetes mellitus    "diet controlled" (05/03/2013)   Exertional shortness of breath    Generalized anxiety disorder    GERD (gastroesophageal reflux disease)    Gout    Heart murmur    Hyperlipidemia    Hypertension    Hypokalemia 11/08/2018   Hypothyroidism    Left bundle branch block    s/p CRT-D (04/2013)   Major depressive disorder    Migraines    On home oxygen therapy    "2L suppose to be q night" (05/03/2013)   Orthostatic hypotension 07/28/2017   Peripheral neuropathy    Feet   SVD (spontaneous vaginal delivery)    x 2   Unspecified vitamin D deficiency 03/26/2011   Does not take meds    Past Surgical History:  Procedure Laterality Date   BI-VENTRICULAR IMPLANTABLE CARDIOVERTER DEFIBRILLATOR N/A 05/03/2013   Procedure: BI-VENTRICULAR IMPLANTABLE CARDIOVERTER DEFIBRILLATOR  (CRT-D);  Surgeon: Evans Lance, MD;  Location: [facility] CATH LAB;  Service: Cardiovascular;  Laterality: N/A;   BI-VENTRICULAR IMPLANTABLE CARDIOVERTER DEFIBRILLATOR  (CRT-D)  05/03/2013   MDT CRTD implanted by Dr Lovena Le for non ischemic cardiomyopathy   BIOPSY  05/31/2018   Procedure: BIOPSY;  Surgeon: Ronnette Juniper, MD;  Location: [facility];  Service: Gastroenterology;;   BIV ICD [place] N/A 04/11/2020  Procedure: BIV ICD GENERATOR CHANGEOUT;  Surgeon: Evans Lance, MD;  Location: [place] CV LAB;  Service: Cardiovascular;  Laterality: N/A;   BREAST LUMPECTOMY Left 07/28/2010   CARDIAC CATHETERIZATION  2010   NORMAL CORONARY ARTERIES   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--11 SURGERIES     PT HAS REMOVABLE SPEECH BULB-TAKES IT OUT BEFORE HER SURGERY   COLONOSCOPY     COLONOSCOPY N/A 05/31/2018   Procedure: COLONOSCOPY;  Surgeon: Ronnette Juniper, MD;  Location: [facility] ENDOSCOPY;  Service: Gastroenterology;  Laterality: N/A;   HYSTEROSCOPY WITH D & C  01/20/2012   Procedure:  DILATATION AND CURETTAGE /HYSTEROSCOPY;  Surgeon: Margarette Asal, MD;  Location: [place] [facility];  Service: Gynecology;  Laterality: N/A;  with trueclear   PORT-A-CATH REMOVAL  09/23/2011   Procedure: REMOVAL PORT-A-CATH;  Surgeon: Rolm Bookbinder, MD;  Location: [facility];  Service: General;  Laterality: N/A;  Port Removal   PORTACATH PLACEMENT  2012   TONSILLECTOMY  1960's   TOTAL KNEE ARTHROPLASTY  05/10/2012   Procedure: TOTAL KNEE ARTHROPLASTY;  Surgeon: Mauri Pole, MD;  Location: [facility];  Service: Orthopedics;  Laterality: Right;   WISDOM TOOTH EXTRACTION      Prior to Admission medications   Medication Sig Start Date End Date Taking? Authorizing Provider  acetaminophen (TYLENOL) 500 MG tablet Take 1,000 mg by mouth every 6 (six) hours as needed for moderate pain.   Yes [provider]  acetaZOLAMIDE (DIAMOX) 125 MG tablet Take 1 tablet (125 mg total) by mouth daily. 02/27/21  Yes Patel, Donika K, DO  carvedilol (COREG) 3.125 MG tablet TAKE 1 TABLET (3.125 MG) BY MOUTH TWICE DAILY WITH MEALS Patient taking differently: Take 3.125 mg by mouth 2 (two) times daily with a meal. 10/01/21  Yes Joette Catching, PA-C  citalopram (CELEXA) 40 MG tablet Take 40 mg by mouth daily.   Yes [provider]  clonazePAM (KLONOPIN) 0.5 MG tablet Take 0.5 mg by mouth daily as needed for anxiety. 07/23/20  Yes [provider]  dicyclomine (BENTYL) 20 MG tablet Take 20 mg by mouth 3 (three) times daily before meals.   Yes [provider]  fluticasone (FLONASE) 50 MCG/ACT nasal spray Place 1 spray into both nostrils daily as needed for allergies or rhinitis.   Yes [provider]  gabapentin (NEURONTIN) 300 MG capsule Take 300-600 capsules by mouth 2 (two) times daily.   Yes [provider]  levothyroxine (SYNTHROID) 88 MCG tablet TAKE 1 TABLET(88 MCG) BY MOUTH DAILY BEFORE BREAKFAST Patient taking differently: Take 88 mcg by mouth daily before breakfast.  07/08/20  Yes Philemon Kingdom, MD  loperamide (IMODIUM A-D) 2 MG tablet Take 2 mg by mouth 4 (four) times daily as needed for diarrhea or loose stools.   Yes [provider]  metolazone (ZAROXOLYN) 2.5 MG tablet Take 1 tablet by mouth every other Tuesday. Patient taking differently: Take 2.5 mg by mouth See admin instructions. Take 1 tablet by mouth every other Tuesday. 10/01/21  Yes Joette Catching, PA-C  midodrine (PROAMATINE) 10 MG tablet TAKE 1 TABLET THREE TIMES DAILY WITH MEALS Patient taking differently: Take 10 mg by mouth 3 (three) times daily. 10/01/21  Yes Joette Catching, PA-C  Multiple Vitamin (MULTIVITAMIN WITH MINERALS) TABS tablet Take 1 tablet by mouth daily.   Yes [provider]  potassium chloride SA (KLOR-CON M) 20 MEQ tablet Take 3 tablets (60 mEq total) by mouth 3 (three) times daily. 10/01/21  Yes Marlyce Huge  Elmyra Ricks, PA-C  Pseudoeph-Doxylamine-DM-APAP (DAYQUIL/NYQUIL COLD/FLU RELIEF PO) Take 15 mLs by mouth at bedtime as needed (sleep).   Yes [provider]  Simethicone (GAS-X MAXIMUM STRENGTH PO) Take 1 tablet by mouth daily as needed (flatulance).   Yes [provider]  simvastatin (ZOCOR) 10 MG tablet Take 1 tablet (10 mg total) by mouth every evening. 10/01/21  Yes Joette Catching, PA-C  spironolactone (ALDACTONE) 25 MG tablet Take 1 tablet (25 mg total) by mouth every evening. Needs appt for further refills 10/01/21  Yes Joette Catching, PA-C  torsemide (DEMADEX) 20 MG tablet Take 4 tablets (80 mg total) by mouth every morning AND 3 tablets (60 mg total) every evening. 10/01/21  Yes Joette Catching, PA-C  fludrocortisone (FLORINEF) 0.1 MG tablet Take 1 tablet (100 mcg total) by mouth 2 (two) times daily. Patient not taking: Reported on 08/19/2021 02/27/21   Narda Amber K, DO  gabapentin (NEURONTIN) 800 MG tablet TAKE 1 TABLET AT BEDTIME AS NEEDED FOR NEUROPATHY Patient not taking: Reported on 10/08/2021  08/13/21   Narda Amber K, DO    Scheduled Meds:  carvedilol  3.125 mg Oral BID WC   [START ON 10/10/2021] citalopram  20 mg Oral Daily   dicyclomine  20 mg Oral TID AC   fludrocortisone  100 mcg Oral BID   heparin  5,000 Units Subcutaneous Q8H   insulin aspart  0-5 Units Subcutaneous QHS   insulin aspart  0-9 Units Subcutaneous TID WC   insulin aspart  2 Units Subcutaneous TID WC   levothyroxine  88 mcg Oral Q0600   midodrine  10 mg Oral TID WC   mirtazapine  30 mg Oral QHS   pantoprazole  40 mg Oral Daily   Continuous Infusions:  sodium chloride 50 mL/hr at 10/09/21 1006   PRN Meds:.acetaminophen **OR** acetaminophen, clonazePAM, hydrOXYzine, morphine injection, ondansetron **OR** ondansetron (ZOFRAN) IV  Allergies as of 10/08/2021 - Review Complete 10/08/2021  Allergen Reaction Noted   Ceftin Anaphylaxis 05/07/2011   Geodon [ziprasidone hcl] Hives 11/08/2018   Lisinopril Other (See Comments) 11/08/2018   Shellfish allergy Other (See Comments) 01/09/2012   Shellfish-derived products  10/02/2020   Cefuroxime  07/23/2021   Sulfacetamide sodium-sulfur  07/23/2021   Ziprasidone  07/23/2021   Allopurinol Nausea Only and Other (See Comments) 09/17/2011   Ativan [lorazepam] Itching 12/18/2013   Sulfa antibiotics Itching 01/09/2012   Ultram [tramadol hcl] Itching 09/17/2011   Ultram [tramadol] Itching 10/02/2020   Valium [diazepam] Other (See Comments) 09/27/2019    Family History  Problem Relation Age of Onset   Alzheimer's disease Mother    CVA Mother    Hypertension Mother    Cancer - Colon Father        late 38   Birth defects Paternal Uncle     Social History   Socioeconomic History   Marital status: Married    Spouse name: Not on file   Number of children: 2   Years of education: 18   Highest education level: Master's degree (e.g., MA, MS, MEng, MEd, MSW, MBA)  Occupational History   Occupation: retired  Tobacco Use   Smoking status: Former    Packs/day:  0.10    Years: 26.00    Pack years: 2.60    Types: Cigarettes    Quit date: 03/23/2021    Years since quitting: 0.5   Smokeless tobacco: Never  Vaping Use   Vaping Use: Never used  Substance and Sexual Activity   Alcohol use:  No   Drug use: No   Sexual activity: Yes  Other Topics Concern   Not on file  Social History Narrative   Tobacco Use Cigarettes: Former Smoker, Quit in 2008   No Alcohol   No recreational drug use   Diet: Regular/Low Carb   Exercise: None   Occupation: disabled   Education: Research officer, political party, masters   Children: 2   Firearms: No   Therapist, art Use: 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      Social Determinants of Health   Financial Resource Strain: Not on file  Food Insecurity: Not on file  Transportation Needs: Not on file  Physical Activity: Not on file  Stress: Not on file  Social Connections: Not on file  Intimate Partner Violence: Not on file    Review of Systems: Review of Systems  Unable to perform ROS: Dementia  Constitutional:  Positive for malaise/fatigue. Negative for chills and fever.  Eyes:  Negative for blurred vision and double vision.  Cardiovascular:  Negative for chest pain and palpitations.  Gastrointestinal:  Positive for abdominal pain and vomiting. Negative for blood in stool, constipation, diarrhea, heartburn, melena and nausea.  Genitourinary:  Negative for dysuria and urgency.  Musculoskeletal:  Negative for myalgias and neck pain.  Skin:  Negative for itching and rash.  Neurological:  Positive for dizziness and weakness. Negative for headaches.  Endo/Heme/Allergies:  Negative for environmental allergies. Does not bruise/bleed easily.  Psychiatric/Behavioral:  Negative for depression and suicidal ideas.     Physical Exam:Physical Exam Constitutional:      General: She is not in acute distress.    Appearance: Normal appearance. She is normal weight.  HENT:     Head: Normocephalic and atraumatic.      Right Ear: External ear normal.     Left Ear: External ear normal.     Nose: Nose normal.     Mouth/Throat:     Mouth: Mucous membranes are moist.  Eyes:     Pupils: Pupils are equal, round, and reactive to light.  Cardiovascular:     Rate and Rhythm: Normal rate and regular rhythm.     Pulses: Normal pulses.     Heart sounds: Normal heart sounds.  Pulmonary:     Effort: Pulmonary effort is normal.     Breath sounds: Normal breath sounds.  Abdominal:     General: Abdomen is flat. Bowel sounds are normal. There is no distension.     Palpations: Abdomen is soft. There is no mass.     Tenderness: There is abdominal tenderness (ruq). There is no guarding or rebound.     Hernia: No hernia is present.  Musculoskeletal:        General: Normal range of motion.     Cervical back: Normal range of motion and neck supple.  Skin:    General: Skin is warm and dry.  Neurological:     Mental Status: She is alert. Mental status is at baseline.  Psychiatric:        Mood and Affect: Mood normal.        Behavior: Behavior normal.    Vital signs: Vitals:   10/09/21 1033 10/09/21 1036  BP: 114/67 (!) 81/61  Pulse: 87 86  Resp:    Temp:    SpO2: 95% 95%   Last BM Date : 10/08/21    GI:  Lab Results: Recent Labs    10/06/21 1354 10/08/21 1103  10/09/21 0543  WBC 12.7* 9.6 8.1  HGB 14.8 13.5 11.6*  HCT 43.8 41.0 33.9*  PLT 308 260 194   BMET Recent Labs    10/06/21 1354 10/08/21 1103 10/09/21 0543  NA 134* 133* 136  K 5.0 4.3 3.8  CL 99 100 109  CO2 23 20* 20*  GLUCOSE 166* 157* 113*  BUN 42* 36* 33*  CREATININE 2.04* 2.05* 1.47*  CALCIUM 9.9 9.6 8.5*   LFT Recent Labs    10/09/21 0543  PROT 6.7  ALBUMIN 3.0*  AST 27  ALT 30  ALKPHOS 186*  BILITOT 0.7   PT/INR Recent Labs    10/08/21 1758  LABPROT 15.1  INR 1.2     Studies/Results: DG Chest Portable 1 View  Result Date: 10/08/2021 CLINICAL DATA:  sob EXAM: PORTABLE CHEST 1 VIEW COMPARISON:   None Available. FINDINGS: LEFT-sided pacer overlies normal cardiac silhouette. No effusion, infiltrate, or pneumothorax. No acute osseous abnormality. IMPRESSION: No acute cardiopulmonary process. Electronically Signed   By: Suzy Bouchard M.D.   On: 10/08/2021 13:10    Impression: Abdominal pain Biliary duct dilation Cholelithiasis  Right upper quadrant ultrasound 10/06/2021 Cholelithiasis with no reported tenderness over the gallbladder but with moderate biliary duct distension both intra and extrahepatic suspected. Choledocholithiasis is not excluded. MRI/MRCP may be helpful as warranted for further assessment.  Awaiting MRCP for evaluation of choledocholithiasis.  Mild elevation in alk phos at 186 otherwise no elevation in ALT/AST or bilirubin.  Kidney function improving during admission creatinine now 1.47 BUN 33.  WBC 8.1, hemoglobin 11.6  Possible choledocholithiasis with presence of cholelithiasis and moderately dilated biliary ducts.  We will evaluate further with MRCP.    Plan: Continue supportive care Await results of MRCP for further evaluation.  If choledocholithiasis found recommend ERCP.  Patient may also benefit from general surgery consultation for possible cholecystectomy. [facility] will follow.  LOS: 1 day   Charlott Rakes  PA-C 10/09/2021, 11:27 AM  Contact #  [phone]

## 2021-10-10 ENCOUNTER — Inpatient Hospital Stay (HOSPITAL_COMMUNITY): Payer: Medicare HMO

## 2021-10-10 DIAGNOSIS — N179 Acute kidney failure, unspecified: Secondary | ICD-10-CM | POA: Diagnosis not present

## 2021-10-10 DIAGNOSIS — Z95810 Presence of automatic (implantable) cardiac defibrillator: Secondary | ICD-10-CM | POA: Diagnosis not present

## 2021-10-10 DIAGNOSIS — Z6841 Body Mass Index (BMI) 40.0 and over, adult: Secondary | ICD-10-CM | POA: Diagnosis not present

## 2021-10-10 DIAGNOSIS — R112 Nausea with vomiting, unspecified: Secondary | ICD-10-CM | POA: Diagnosis not present

## 2021-10-10 LAB — COMPREHENSIVE METABOLIC PANEL
ALT: 27 U/L (ref 0–44)
AST: 22 U/L (ref 15–41)
Albumin: 3.1 g/dL — ABNORMAL LOW (ref 3.5–5.0)
Alkaline Phosphatase: 193 U/L — ABNORMAL HIGH (ref 38–126)
Anion gap: 7 (ref 5–15)
BUN: 18 mg/dL (ref 8–23)
CO2: 20 mmol/L — ABNORMAL LOW (ref 22–32)
Calcium: 9 mg/dL (ref 8.9–10.3)
Chloride: 113 mmol/L — ABNORMAL HIGH (ref 98–111)
Creatinine, Ser: 1.11 mg/dL — ABNORMAL HIGH (ref 0.44–1.00)
GFR, Estimated: 54 mL/min — ABNORMAL LOW (ref >60)
Glucose, Bld: 140 mg/dL — ABNORMAL HIGH (ref 70–99)
Potassium: 3.7 mmol/L (ref 3.5–5.1)
Sodium: 140 mmol/L (ref 135–145)
Total Bilirubin: 0.6 mg/dL (ref 0.3–1.2)
Total Protein: 7.1 g/dL (ref 6.5–8.1)

## 2021-10-10 LAB — GLUCOSE, CAPILLARY
Glucose-Capillary: 108 mg/dL — ABNORMAL HIGH (ref 70–99)
Glucose-Capillary: 137 mg/dL — ABNORMAL HIGH (ref 70–99)
Glucose-Capillary: 97 mg/dL (ref 70–99)
Glucose-Capillary: 125 mg/dL — ABNORMAL HIGH (ref 70–99)

## 2021-10-10 MED ORDER — DIPHENHYDRAMINE HCL 50 MG/ML IJ SOLN
12.5000 mg | Freq: Once | INTRAMUSCULAR | Status: DC
  Filled 2021-10-10: qty 1

## 2021-10-10 MED ORDER — MORPHINE SULFATE (PF) 4 MG/ML IV SOLN
3.0000 mg | Freq: Once | INTRAVENOUS | Status: AC
  Administered 2021-10-10: 3 mg via INTRAVENOUS

## 2021-10-10 MED ORDER — TECHNETIUM TC 99M MEBROFENIN IV KIT
5.3000 | PACK | Freq: Once | INTRAVENOUS | Status: AC
  Administered 2021-10-10: 5.3 via INTRAVENOUS

## 2021-10-10 NOTE — Progress Notes (Signed)
[facility] PROGRESS NOTE    Progress Note  KIMONI PICKERILL  LGX:[id number] DOB: 1950-08-18 DOA: 10/08/2021 PCP: [name], MD     Brief Narrative:   CLEVA CAMERO is an 71 y.o. female  past medical history of chronic systolic heart failure with an EF of 60% back in January felt sick to be secondary to viral cardiomyopathy dating back in 106 with normal cath in 2010, chronic kidney disease stage IIIb, morbid obesity, triple negative invasive ductal carcinoma status post chemo and radiation with last treatment in 2012,  with [employer] CRT device, she is followed by the heart failure clinic and nephrology she is currently on Florinef and midodrine for orthostatic hypotension, went to see her PCP as she had been having nausea vomiting and diarrhea with hypotension with dizziness was sent to the ED CT scan abdominal ultrasound showed choledocholithiasis.  MRCP was ordered, GI has been consulted.  Assessment/Plan:   Intractable nausea and vomiting possibly due to choledocholithiasis: Tmax of 98.2, leukocytosis this morning is 8.1. Cannot perform MRCP due to pacemaker. She probably needs a HIDA scan will discuss with GI. Awaiting GI further recommendations  Acute kidney injury on chronic systolic heart failure 3A: Diuretic therapy was held.  Baseline creatinine 1.8-1.6. She was continued on Coreg midodrine and Florinef. Discontinue IV fluid his creatinine has returned to baseline.  Chronic systolic heart failure: Continue to hold diuretic therapy, continue Coreg.  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  Hypothyroidism: Continue Synthroid.  Hypovolemic hyponatremia: Resolved with fluid resuscitation.  Orthostatic hypotension: Midodrine was resumed and recheck orthostatics now that she has been fluid resuscitated.  Diabetes mellitus with neuropathy (HCC) Continue Neurontin.  Body mass index (BMI) 45.0-49.9, adult ([place]) Noted counseling.  Personal history of  malignant neoplasm of breast History of triple negative breast cancer in 1999.   DVT prophylaxis: lovenox Family Communication:Husband Status is: Inpatient Remains inpatient appropriate because: Nausea and vomiting possibly due to choledocholithiasis    Code Status:     Code Status Orders  (From admission, onward)           Start     Ordered   10/08/21 1724  Full code  Continuous        10/08/21 1724           Code Status History     Date Active Date Inactive Code Status Order ID Comments User Context   04/11/2020 1114 04/11/2020 1845 Full Code [id number]  Evans Lance, MD Inpatient   11/08/2018 1757 11/10/2018 1926 Full Code [id number]  Karmen Bongo, MD ED   05/03/2013 1844 05/04/2013 1350 Full Code [id number]  Evans Lance, MD Inpatient   05/10/2012 1557 05/13/2012 1502 Full Code [id number]  Crutchfield, Antony Haste, RN Inpatient         IV Access:   Peripheral IV   Procedures and diagnostic studies:   DG Chest Portable 1 View  Result Date: 10/08/2021 CLINICAL DATA:  sob EXAM: PORTABLE CHEST 1 VIEW COMPARISON:  None Available. FINDINGS: LEFT-sided pacer overlies normal cardiac silhouette. No effusion, infiltrate, or pneumothorax. No acute osseous abnormality. IMPRESSION: No acute cardiopulmonary process. Electronically Signed   By: Suzy Bouchard M.D.   On: 10/08/2021 13:10     Medical Consultants:   None.   Subjective:    [name] feels better nausea has resolved, she does not like the food here.  Objective:    Vitals:   10/09/21 1619 10/09/21 1621 10/09/21 2121 10/10/21 0454  BP: (!) 146/62  Marland Kitchen)  147/70 124/60  Pulse:  66 (!) 59 72  Resp:   19 14  Temp:   97.8 F (36.6 C) 98.6 F (37 C)  TempSrc:   Oral Oral  SpO2:   100% 98%   SpO2: 98 %   Intake/Output Summary (Last 24 hours) at 10/10/2021 1035 Last data filed at 10/10/2021 0900 Gross per 24 hour  Intake 797.66 ml  Output --  Net 797.66 ml    There were no vitals filed  for this visit.  Exam: General exam: In no acute distress. Respiratory system: Good air movement and clear to auscultation. Cardiovascular system: S1 & S2 heard, RRR. No JVD. Gastrointestinal system: Abdomen is nondistended, soft and nontender.  Extremities: No pedal edema. Skin: No rashes, lesions or ulcers Psychiatry: Judgement and insight appear normal. Mood & affect appropriate.   Data Reviewed:    Labs: Basic Metabolic Panel: Recent Labs  Lab 10/05/21 2227 10/06/21 1354 10/08/21 1103 10/09/21 0543  NA 134* 134* 133* 136  K 3.6 5.0 4.3 3.8  CL 101 99 100 109  CO2 22 23 20* 20*  GLUCOSE 166* 166* 157* 113*  BUN 34* 42* 36* 33*  CREATININE 1.78* 2.04* 2.05* 1.47*  CALCIUM 9.9 9.9 9.6 8.5*    GFR Estimated Creatinine Clearance: 37.9 mL/min (A) (by C-G formula based on SCr of 1.47 mg/dL (H)). Liver Function Tests: Recent Labs  Lab 10/05/21 2227 10/06/21 1354 10/08/21 1103 10/09/21 0543  AST 31 33 33 27  ALT 39 36 36 30  ALKPHOS 259* 242* 221* 186*  BILITOT 1.0 0.8 0.5 0.7  PROT 9.2* 9.4* 8.6* 6.7  ALBUMIN 3.7 4.1 4.1 3.0*    Recent Labs  Lab 10/05/21 2227 10/06/21 1354 10/08/21 1103  LIPASE '25 31 18    '$ No results for input(s): AMMONIA in the last 168 hours. Coagulation profile Recent Labs  Lab 10/08/21 1758  INR 1.2    COVID-19 Labs  Recent Labs    10/08/21 1102  DDIMER 1.25*     Lab Results  Component Value Date   SARSCOV2NAA NEGATIVE 10/06/2021   [place] NEGATIVE 04/09/2020   [place] Not Detected 03/15/2020   [place] Not Detected 06/08/2019    CBC: Recent Labs  Lab 10/05/21 2227 10/06/21 1354 10/08/21 1103 10/09/21 0543  WBC 12.5* 12.7* 9.6 8.1  NEUTROABS  --  9.9*  --   --   HGB 14.6 14.8 13.5 11.6*  HCT 43.8 43.8 41.0 33.9*  MCV 98.9 100.0 98.1 101.8*  PLT 327 308 260 194    Cardiac Enzymes: No results for input(s): CKTOTAL, CKMB, CKMBINDEX, TROPONINI in the last 168 hours. BNP (last 3 results) No  results for input(s): PROBNP in the last 8760 hours. CBG: Recent Labs  Lab 10/09/21 0723 10/09/21 1157 10/09/21 1651 10/09/21 2127 10/10/21 0734  GLUCAP 141* 152* 131* 107* 108*    D-Dimer: Recent Labs    10/08/21 1102  DDIMER 1.25*    Hgb A1c: Recent Labs    10/08/21 1758 10/09/21 1049  HGBA1C 6.9* 7.0*    Lipid Profile: No results for input(s): CHOL, HDL, LDLCALC, TRIG, CHOLHDL, LDLDIRECT in the last 72 hours. Thyroid function studies: Recent Labs    10/08/21 1758  TSH 1.127    Anemia work up: No results for input(s): VITAMINB12, FOLATE, FERRITIN, TIBC, IRON, RETICCTPCT in the last 72 hours. Sepsis Labs: Recent Labs  Lab 10/05/21 2227 10/06/21 1354 10/08/21 1103 10/09/21 0543  WBC 12.5* 12.7* 9.6 8.1    Microbiology Recent Results (from  the past 240 hour(s))  Resp Panel by RT-PCR (Flu A&B, Covid) Nasopharyngeal Swab     Status: None   Collection Time: 10/06/21  5:48 PM   Specimen: Nasopharyngeal Swab; Nasopharyngeal(NP) swabs in vial transport medium  Result Value Ref Range Status   SARS Coronavirus 2 by RT PCR NEGATIVE NEGATIVE Final    Comment: (NOTE) SARS-CoV-2 target nucleic acids are NOT DETECTED.  The SARS-CoV-2 RNA is generally detectable in upper respiratory specimens during the acute phase of infection. The lowest concentration of SARS-CoV-2 viral copies this assay can detect is 138 copies/mL. A negative result does not preclude SARS-Cov-2 infection and should not be used as the sole basis for treatment or other patient management decisions. A negative result may occur with  improper specimen collection/handling, submission of specimen other than nasopharyngeal swab, presence of viral mutation(s) within the areas targeted by this assay, and inadequate number of viral copies(<138 copies/mL). A negative result must be combined with clinical observations, patient history, and epidemiological information. The expected result is  Negative.  Fact Sheet for Patients:  EntrepreneurPulse.com.au  Fact Sheet for Healthcare Providers:  IncredibleEmployment.be  This test is no t yet approved or cleared by the [place] FDA and  has been authorized for detection and/or diagnosis of SARS-CoV-2 by FDA under an Emergency Use Authorization (EUA). This EUA will remain  in effect (meaning this test can be used) for the duration of the COVID-19 declaration under Section 564(b)(1) of the Act, 21 U.S.C.section 360bbb-3(b)(1), unless the authorization is terminated  or revoked sooner.       Influenza A by PCR NEGATIVE NEGATIVE Final   Influenza B by PCR NEGATIVE NEGATIVE Final    Comment: (NOTE) The Xpert Xpress SARS-CoV-2/FLU/RSV plus assay is intended as an aid in the diagnosis of influenza from Nasopharyngeal swab specimens and should not be used as a sole basis for treatment. Nasal washings and aspirates are unacceptable for Xpert Xpress SARS-CoV-2/FLU/RSV testing.  Fact Sheet for Patients: EntrepreneurPulse.com.au  Fact Sheet for Healthcare Providers: IncredibleEmployment.be  This test is not yet approved or cleared by the [place] FDA and has been authorized for detection and/or diagnosis of SARS-CoV-2 by FDA under an Emergency Use Authorization (EUA). This EUA will remain in effect (meaning this test can be used) for the duration of the COVID-19 declaration under Section 564(b)(1) of the Act, 21 U.S.C. section 360bbb-3(b)(1), unless the authorization is terminated or revoked.  Performed at [facility], [place] [name], [place], [place] [address]      Medications:    carvedilol  3.125 mg Oral BID WC   citalopram  20 mg Oral Daily   dicyclomine  20 mg Oral TID AC   diphenhydrAMINE  12.5 mg Intravenous Once   fludrocortisone  100 mcg Oral BID   heparin  5,000 Units Subcutaneous Q8H   insulin aspart  0-5  Units Subcutaneous QHS   insulin aspart  0-9 Units Subcutaneous TID WC   insulin aspart  2 Units Subcutaneous TID WC   levothyroxine  88 mcg Oral Q0600   midodrine  10 mg Oral TID WC   mirtazapine  30 mg Oral QHS   pantoprazole  40 mg Oral Daily   Continuous Infusions:      LOS: 2 days   Charlynne Cousins  [facility]  10/10/2021, 10:35 AM

## 2021-10-10 NOTE — TOC Initial Note (Signed)
Transition of Care [facility]) - Initial/Assessment Note    Patient Details  Name: Rhonda [name] MRN: [id number] Date of Birth: [birth year]  [facility]) CM/SW Contact:    Lean Fayson, Marjie Skiff, RN Phone Number: 10/10/2021, 1:07 PM  Clinical Narrative:                   Expected Discharge Plan: Home/Self Care Barriers to Discharge: Continued Medical Work up   Patient Goals and CMS Choice Patient states their goals for this hospitalization and ongoing recovery are:: To go home CMS Medicare.gov Compare Post Acute Care list provided to:: Other (Comment Required) (No need) Choice offered to / list presented to : NA  Expected Discharge Plan and Services Expected Discharge Plan: Home/Self Care   Discharge Planning Services: CM Consult   Living arrangements for the past 2 months: Single Family Home                     Prior Living Arrangements/Services Living arrangements for the past 2 months: Single Family Home Lives with:: Spouse Patient language and need for interpreter reviewed:: Yes        Need for Family Participation in Patient Care: No (Comment)     Criminal Activity/Legal Involvement Pertinent to Current Situation/Hospitalization: No - Comment as needed  Activities of Daily Living Home Assistive Devices/Equipment: Cane (specify quad or straight) ADL Screening (condition at time of admission) Patient's cognitive ability adequate to safely complete daily activities?: Yes Is the patient deaf or have difficulty hearing?: No Does the patient have difficulty seeing, even when wearing glasses/contacts?: No Does the patient have difficulty concentrating, remembering, or making decisions?: No Patient able to express need for assistance with ADLs?: No Does the patient have difficulty dressing or bathing?: No Independently performs ADLs?: Yes (appropriate for developmental age) Does the patient have difficulty walking or climbing stairs?: No Weakness of Legs:  None Weakness of Arms/Hands: None  Alcohol / Substance Use: Not Applicable Psych Involvement: No (comment)  Admission diagnosis:  Dehydration [E86.0] Upper abdominal pain [R10.10] Nausea vomiting and diarrhea [R11.2, R19.7] Intractable nausea and vomiting [R11.2] Patient Active Problem List   Diagnosis Date Noted   Intractable nausea and vomiting 10/08/2021   AKI (acute kidney injury) ([place]) 10/08/2021   Hyponatremia 10/08/2021   Decreased estrogen level 08/01/2021   Dementia ([place]) 08/01/2021   Frail elderly 08/01/2021   Hyperparathyroidism ([place]) 08/01/2021   Spondylolisthesis 08/01/2021   Varicose veins of bilateral lower extremities with other complications 13/12/6576   Lumbago without sciatica 04/30/2021   Abnormal gait 06/11/2020   Adult failure to thrive syndrome 06/11/2020   Allergic rhinitis 06/11/2020   Anxiety 06/11/2020   Asthma 06/11/2020   Benign intracranial hypertension 06/11/2020   Body mass index (BMI) 45.0-49.9, adult ([place]) 06/11/2020   Bowel incontinence 06/11/2020   Cholelithiasis 06/11/2020   Chronic sinusitis 06/11/2020   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   Daytime somnolence 06/11/2020   Diarrhea of presumed infectious origin 06/11/2020   Edema 06/11/2020   Family history of malignant neoplasm of gastrointestinal tract 06/11/2020   Gout 06/11/2020   History of fall 06/11/2020   History of infectious disease 06/11/2020   Insomnia 06/11/2020   Irregular bowel habits 06/11/2020   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   Lumbar spondylosis with myelopathy 06/11/2020   Macrocytosis 06/11/2020   Mild recurrent major depression ([place]) 06/11/2020   Personal history of colonic polyps 06/11/2020   Personal history of malignant neoplasm of breast 06/11/2020   Repeated falls 06/11/2020  Mixed anxiety and depressive disorder 06/11/2020   Irritable bowel syndrome 01/04/2020   Spinal stenosis of lumbar region 01/03/2020   Other symptoms and signs involving the musculoskeletal  system 09/11/2019   Bilateral leg weakness 08/01/2019   Leg swelling 08/01/2019   Diabetes mellitus with neuropathy ([place]) 06/15/2019   Hypokalemia 11/08/2018   CKD (chronic kidney disease) stage 3, GFR 30-59 ml/min (HCC) 11/08/2018   Orthostatic hypotension 07/28/2017   SVD (spontaneous vaginal delivery)    Peripheral neuropathy    On home oxygen therapy    Migraines    Left bundle branch block    Hypothyroidism    Hypertension    Hyperlipidemia    Heart murmur    GERD (gastroesophageal reflux disease)    Exertional shortness of breath    Major depressive disorder    Back pain    Arthritis    Generalized anxiety disorder    Anemia    Chronic respiratory failure ([place]) 09/14/2013   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   Morbid obesity ([place]) 73/42/8768   Chronic systolic heart failure ([place]) 10/27/2012   Endometrial polyp 01/20/2012   Malignant tumor of breast ([place]) 03/26/2011   Unspecified vitamin D deficiency 03/26/2011   PCP:  [name], MD Pharmacy:   [facility] [place], [place] - [phone] [place] [address] Phone: [phone] Fax: [phone]  [id number] - [place], [place] - [phone] [address] AT [place] [address] [place] [address] Phone: [phone] Fax: [phone]     Social Determinants of Health ([place]) Interventions    Readmission Risk Interventions    10/10/2021   12:48 PM  Readmission Risk Prevention Plan  Transportation Screening Complete  PCP or Specialist Appt within 5-7 Days Complete  Home Care Screening Complete  Medication Review (RN CM) Complete

## 2021-10-10 NOTE — Progress Notes (Signed)
Subjective: No further N/V/D. Abdominal pain improved.  Objective: Vital signs in last 24 hours: Temp:  [97.8 F (36.6 C)-98.6 F (37 C)] 98.6 F (37 C) (05/19 0454) Pulse Rate:  [59-72] 72 (05/19 0454) Resp:  [14-19] 14 (05/19 0454) BP: (124-147)/(60-70) 124/60 (05/19 0454) SpO2:  [98 %-100 %] 98 % (05/19 0454) Weight change:  Last BM Date : 10/08/21  PE: GEN:  NAD, chronically ill-appearing ABD:  protuberant, soft, non-tender  Lab Results: CBC    Component Value Date/Time   WBC 8.1 10/09/2021 0543   RBC 3.33 (L) 10/09/2021 0543   HGB 11.6 (L) 10/09/2021 0543   HGB 11.1 04/09/2020 0806   HGB 11.2 (L) 07/16/2011 1052   HCT 33.9 (L) 10/09/2021 0543   HCT 32.2 (L) 04/09/2020 0806   HCT 33.2 (L) 07/16/2011 1052   PLT 194 10/09/2021 0543   PLT 314 04/09/2020 0806   MCV 101.8 (H) 10/09/2021 0543   MCV 99 (H) 04/09/2020 0806   MCV 100.2 07/16/2011 1052   MCH 34.8 (H) 10/09/2021 0543   MCHC 34.2 10/09/2021 0543   RDW 12.5 10/09/2021 0543   RDW 12.0 04/09/2020 0806   RDW 14.4 07/16/2011 1052   LYMPHSABS 1.8 10/06/2021 1354   LYMPHSABS 1.3 04/09/2020 0806   LYMPHSABS 0.9 07/16/2011 1052   MONOABS 0.8 10/06/2021 1354   MONOABS 0.4 07/16/2011 1052   EOSABS 0.1 10/06/2021 1354   EOSABS 0.5 (H) 04/09/2020 0806   BASOSABS 0.0 10/06/2021 1354   BASOSABS 0.0 04/09/2020 0806   BASOSABS 0.0 07/16/2011 1052  CMP     Component Value Date/Time   NA 136 10/09/2021 0543   NA 142 04/09/2020 0806   K 3.8 10/09/2021 0543   CL 109 10/09/2021 0543   CO2 20 (L) 10/09/2021 0543   GLUCOSE 113 (H) 10/09/2021 0543   BUN 33 (H) 10/09/2021 0543   BUN 40 (H) 04/09/2020 0806   CREATININE 1.47 (H) 10/09/2021 0543   CREATININE 1.85 (H) 02/10/2018 1152   CALCIUM 8.5 (L) 10/09/2021 0543   PROT 6.7 10/09/2021 0543   ALBUMIN 3.0 (L) 10/09/2021 0543   AST 27 10/09/2021 0543   ALT 30 10/09/2021 0543   ALKPHOS 186 (H) 10/09/2021 0543   BILITOT 0.7 10/09/2021 0543   GFRNONAA 39 (L)  10/09/2021 0543   GFRNONAA 28 (L) 02/10/2018 1152   GFRAA 43 (L) 04/09/2020 0806   GFRAA 33 (L) 02/10/2018 1152    Assessment:  Abdominal pain, nausea, vomiting, loose stools.  Intermittent for the past few weeks.  Has gallstones, not sure if current symptoms are from gallstones.  Plan:   Not MRI/MRCP candidate due to ICD/PM (we have checked).  Will check HIDA scan.  Pending HIDA scan, d/c home versus surgery consult.  Supportive care otherwise for now.  [facility] will follow.   Landry Dyke 10/10/2021, 10:51 AM   Cell [phone] If no answer or after 5 PM call [phone]

## 2021-10-11 ENCOUNTER — Inpatient Hospital Stay (HOSPITAL_COMMUNITY): Payer: Medicare HMO

## 2021-10-11 DIAGNOSIS — K8020 Calculus of gallbladder without cholecystitis without obstruction: Secondary | ICD-10-CM | POA: Diagnosis not present

## 2021-10-11 DIAGNOSIS — N179 Acute kidney failure, unspecified: Secondary | ICD-10-CM | POA: Diagnosis not present

## 2021-10-11 DIAGNOSIS — M25572 Pain in left ankle and joints of left foot: Secondary | ICD-10-CM

## 2021-10-11 DIAGNOSIS — R112 Nausea with vomiting, unspecified: Secondary | ICD-10-CM | POA: Diagnosis not present

## 2021-10-11 DIAGNOSIS — N183 Chronic kidney disease, stage 3 unspecified: Secondary | ICD-10-CM | POA: Diagnosis not present

## 2021-10-11 HISTORY — DX: Pain in left ankle and joints of left foot: M25.572

## 2021-10-11 LAB — GLUCOSE, CAPILLARY
Glucose-Capillary: 108 mg/dL — ABNORMAL HIGH (ref 70–99)
Glucose-Capillary: 177 mg/dL — ABNORMAL HIGH (ref 70–99)
Glucose-Capillary: 143 mg/dL — ABNORMAL HIGH (ref 70–99)
Glucose-Capillary: 172 mg/dL — ABNORMAL HIGH (ref 70–99)

## 2021-10-11 MED ORDER — TORSEMIDE 20 MG PO TABS
80.0000 mg | ORAL_TABLET | Freq: Two times a day (BID) | ORAL | Status: DC
  Administered 2021-10-11 – 2021-10-12 (×3): 80 mg via ORAL
  Filled 2021-10-11 (×3): qty 4

## 2021-10-11 NOTE — Progress Notes (Signed)
Subjective: No pain, nausea, vomiting, diarrhea.  Objective: Vital signs in last 24 hours: Temp:  [97.8 F (36.6 C)-98.5 F (36.9 C)] 97.9 F (36.6 C) (05/20 0457) Pulse Rate:  [59-71] 65 (05/20 0457) Resp:  [16-18] 16 (05/20 0457) BP: (113-142)/(41-65) 113/41 (05/20 0457) SpO2:  [97 %-100 %] 100 % (05/20 0457) Weight change:  Last BM Date : 10/08/21  PE: GEN:  NAD HEENT:  [place]/AT, no icterus NEURO:  A/O, no encephalopathy  Lab Results: CBC    Component Value Date/Time   WBC 8.1 10/09/2021 0543   RBC 3.33 (L) 10/09/2021 0543   HGB 11.6 (L) 10/09/2021 0543   HGB 11.1 04/09/2020 0806   HGB 11.2 (L) 07/16/2011 1052   HCT 33.9 (L) 10/09/2021 0543   HCT 32.2 (L) 04/09/2020 0806   HCT 33.2 (L) 07/16/2011 1052   PLT 194 10/09/2021 0543   PLT 314 04/09/2020 0806   MCV 101.8 (H) 10/09/2021 0543   MCV 99 (H) 04/09/2020 0806   MCV 100.2 07/16/2011 1052   MCH 34.8 (H) 10/09/2021 0543   MCHC 34.2 10/09/2021 0543   RDW 12.5 10/09/2021 0543   RDW 12.0 04/09/2020 0806   RDW 14.4 07/16/2011 1052   LYMPHSABS 1.8 10/06/2021 1354   LYMPHSABS 1.3 04/09/2020 0806   LYMPHSABS 0.9 07/16/2011 1052   MONOABS 0.8 10/06/2021 1354   MONOABS 0.4 07/16/2011 1052   EOSABS 0.1 10/06/2021 1354   EOSABS 0.5 (H) 04/09/2020 0806   BASOSABS 0.0 10/06/2021 1354   BASOSABS 0.0 04/09/2020 0806   BASOSABS 0.0 07/16/2011 1052  CMP     Component Value Date/Time   NA 140 10/10/2021 1053   NA 142 04/09/2020 0806   K 3.7 10/10/2021 1053   CL 113 (H) 10/10/2021 1053   CO2 20 (L) 10/10/2021 1053   GLUCOSE 140 (H) 10/10/2021 1053   BUN 18 10/10/2021 1053   BUN 40 (H) 04/09/2020 0806   CREATININE 1.11 (H) 10/10/2021 1053   CREATININE 1.85 (H) 02/10/2018 1152   CALCIUM 9.0 10/10/2021 1053   PROT 7.1 10/10/2021 1053   ALBUMIN 3.1 (L) 10/10/2021 1053   AST 22 10/10/2021 1053   ALT 27 10/10/2021 1053   ALKPHOS 193 (H) 10/10/2021 1053   BILITOT 0.6 10/10/2021 1053   GFRNONAA 54 (L) 10/10/2021 1053    GFRNONAA 28 (L) 02/10/2018 1152   GFRAA 43 (L) 04/09/2020 0806   GFRAA 33 (L) 02/10/2018 1152   Assessment:  Abdominal pain, nausea, vomiting, loose stools.  Intermittent for the past few weeks.  Has gallstones, not sure if current symptoms are from gallstones.  HIDA negative.  Plan:   Pantoprazole 40 mg po qd, now and upon discharge. Soft, low fat diet for the next few days. OK to discharge home from GI perspective; we will arrange outpatient GI follow-up; thanks for consultation. If symptoms persist as outpatient, could consider EGD/EUS for further evaluation (not MRCP candidate).   Landry Dyke 10/11/2021, 9:07 AM   Cell [phone] If no answer or after 5 PM call [phone]

## 2021-10-11 NOTE — Progress Notes (Signed)
[facility] PROGRESS NOTE    Progress Note  Rhonda [name]  JQZ:[id number] DOB: [birth year] DOA: 10/08/2021 PCP: [name], MD     Brief Narrative:   Rhonda [name] is an 71 y.o. 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  MRCP was ordered, GI has been consulted.  Assessment/Plan:   Intractable nausea and vomiting possibly due to choledocholithiasis: Tmax of 98.2, leukocytosis this morning is 8.1. Cannot perform MRCP due to pacemaker. HIDA scan was negative for hepatobiliary obstruction or cholecystitis. Has been on full liquid diet, will advance to soft see if she tolerates it.  Acute kidney injury on chronic kidney disease stage 3A: Diuretic therapy was held.  Baseline creatinine 1.8-1.6. She was continued on Coreg midodrine and Florinef. Her creatinine has returned to baseline.  Chronic systolic heart failure: Continue to hold diuretic therapy, continue Coreg.  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  Hypothyroidism: Continue Synthroid.  Hypovolemic hyponatremia: Resolved with fluid resuscitation.  Orthostatic hypotension: Midodrine was resumed and recheck orthostatics now that she has been fluid resuscitated.  Diabetes mellitus with neuropathy (HCC) Continue Neurontin.  Body mass index (BMI) 45.0-49.9, adult ([place]) Noted  counseling.  Personal history of malignant neoplasm of breast History of triple negative breast cancer in 1999.  New left ankle pain: No history of gout does not appear to be infected get an x-ray   DVT prophylaxis: lovenox Family Communication:Husband Status is: Inpatient Remains inpatient appropriate because: Nausea and vomiting possibly due to choledocholithiasis    Code Status:     Code Status Orders  (From admission, onward)           Start     Ordered   10/08/21 1724  Full code  Continuous        10/08/21 1724           Code Status History     Date Active Date Inactive Code Status Order ID Comments User Context   04/11/2020 1114 04/11/2020 1845 Full Code [id number]  Evans Lance, MD Inpatient   11/08/2018 1757 11/10/2018 1926 Full Code [id number]  Karmen Bongo, MD ED   05/03/2013 1844 05/04/2013 1350 Full Code [id number]  Evans Lance, MD Inpatient   05/10/2012 1557 05/13/2012 1502 Full Code [id number]  Crutchfield, Antony Haste, RN Inpatient         IV Access:   Peripheral IV   Procedures and diagnostic studies:   NM Hepatobiliary Liver Func  Result Date: 10/10/2021 CLINICAL DATA:  Right upper quadrant pain. Cholelithiasis and biliary ductal dilatation on recent ultrasound. EXAM: NUCLEAR MEDICINE HEPATOBILIARY IMAGING TECHNIQUE: Sequential images of the abdomen were obtained out to 60 minutes following intravenous administration of radiopharmaceutical. Due to lack of visualization of the gallbladder at 60 minutes, slow intravenous infusion of 3.0 mg morphine was performed during continued imaging for another 30 minutes. RADIOPHARMACEUTICALS:  5.3 mCi Tc-6m Choletec IV COMPARISON:  None Available. FINDINGS: Prompt uptake and biliary excretion of activity by the liver is seen. Biliary activity passes promptly into small bowel, consistent with patent common bile duct. Gallbladder activity was not visualized during the initial 60 minutes, but gallbladder  activity was demonstrated following intravenous administration of morphine. IMPRESSION: Negative hepatobiliary scan. No evidence of acute cholecystitis or biliary obstruction. Electronically Signed   By: Marlaine Hind M.D.   On: 10/10/2021 17:20     Medical Consultants:   None.   Subjective:    Rhonda [name] relates nausea has resolved and feels bloated.  Objective:    Vitals:   10/10/21 1323 10/10/21 2059 10/11/21 0457 10/11/21 0907  BP: (!) 142/65 (!) 123/58 (!) 113/41 (!) 115/45  Pulse: 71 (!) 59 65 62  Resp: '16 18 16   '$ Temp: 98.5 F (36.9 C) 97.8 F (36.6 C) 97.9 F (36.6 C)   TempSrc: Oral Oral Oral   SpO2: 97% 100% 100%    SpO2: 100 %  No intake or output data in the 24 hours ending 10/11/21 0921  There were no vitals filed for this visit.  Exam: General exam: In no acute distress. Respiratory system: Good air movement and clear to auscultation. Cardiovascular system: S1 & S2 heard, RRR. No JVD. Gastrointestinal system: Abdomen is nondistended, soft and nontender.  Extremities: No pedal edema. Skin: Left ankle appears swollen not erythematous, not warm to touch Psychiatry: Judgement and insight appear normal. Mood & affect appropriate.   Data Reviewed:    Labs: Basic Metabolic Panel: Recent Labs  Lab 10/05/21 2227 10/06/21 1354 10/08/21 1103 10/09/21 0543 10/10/21 1053  NA 134* 134* 133* 136 140  K 3.6 5.0 4.3 3.8 3.7  CL 101 99 100 109 113*  CO2 22 23 20* 20* 20*  GLUCOSE 166* 166* 157* 113* 140*  BUN 34* 42* 36* 33* 18  CREATININE 1.78* 2.04* 2.05* 1.47* 1.11*  CALCIUM 9.9 9.9 9.6 8.5* 9.0    GFR Estimated Creatinine Clearance: 50.2 mL/min (A) (by C-G formula based on SCr of 1.11 mg/dL (H)). Liver Function Tests: Recent Labs  Lab 10/05/21 2227 10/06/21 1354 10/08/21 1103 10/09/21 0543 10/10/21 1053  AST 31 33 33 27 22  ALT 39 36 36 30 27  ALKPHOS 259* 242* 221* 186* 193*  BILITOT 1.0 0.8 0.5 0.7 0.6  PROT 9.2* 9.4* 8.6* 6.7 7.1   ALBUMIN 3.7 4.1 4.1 3.0* 3.1*    Recent Labs  Lab 10/05/21 2227 10/06/21 1354 10/08/21 1103  LIPASE '25 31 18    '$ No results for input(s): AMMONIA in the last 168 hours. Coagulation profile Recent Labs  Lab 10/08/21 1758  INR 1.2    COVID-19 Labs  Recent Labs    10/08/21 1102  DDIMER 1.25*     Lab Results  Component Value Date   SARSCOV2NAA NEGATIVE 10/06/2021   [place] NEGATIVE 04/09/2020   [place] Not Detected 03/15/2020   [place] Not Detected 06/08/2019    CBC: Recent Labs  Lab 10/05/21 2227 10/06/21 1354 10/08/21 1103 10/09/21 0543  WBC 12.5* 12.7* 9.6 8.1  NEUTROABS  --  9.9*  --   --   HGB 14.6 14.8 13.5 11.6*  HCT 43.8 43.8 41.0 33.9*  MCV 98.9 100.0 98.1 101.8*  PLT 327 308 260 194    Cardiac Enzymes: No results for input(s): CKTOTAL, CKMB, CKMBINDEX, TROPONINI in the last 168 hours. BNP (last 3 results) No results for input(s): PROBNP in the last 8760 hours. CBG: Recent Labs  Lab 10/10/21  3149 10/10/21 1152 10/10/21 1738 10/10/21 2117 10/11/21 0715  GLUCAP 108* 137* 97 125* 108*    D-Dimer: Recent Labs    10/08/21 1102  DDIMER 1.25*    Hgb A1c: Recent Labs    10/08/21 1758 10/09/21 1049  HGBA1C 6.9* 7.0*    Lipid Profile: No results for input(s): CHOL, HDL, LDLCALC, TRIG, CHOLHDL, LDLDIRECT in the last 72 hours. Thyroid function studies: Recent Labs    10/08/21 1758  TSH 1.127    Anemia work up: No results for input(s): VITAMINB12, FOLATE, FERRITIN, TIBC, IRON, RETICCTPCT in the last 72 hours. Sepsis Labs: Recent Labs  Lab 10/05/21 2227 10/06/21 1354 10/08/21 1103 10/09/21 0543  WBC 12.5* 12.7* 9.6 8.1    Microbiology Recent Results (from the past 240 hour(s))  Resp Panel by RT-PCR (Flu A&B, Covid) Nasopharyngeal Swab     Status: None   Collection Time: 10/06/21  5:48 PM   Specimen: Nasopharyngeal Swab; Nasopharyngeal(NP) swabs in vial transport medium  Result Value Ref Range Status   SARS  Coronavirus 2 by RT PCR NEGATIVE NEGATIVE Final    Comment: (NOTE) SARS-CoV-2 target nucleic acids are NOT DETECTED.  The SARS-CoV-2 RNA is generally detectable in upper respiratory specimens during the acute phase of infection. The lowest concentration of SARS-CoV-2 viral copies this assay can detect is 138 copies/mL. A negative result does not preclude SARS-Cov-2 infection and should not be used as the sole basis for treatment or other patient management decisions. A negative result may occur with  improper specimen collection/handling, submission of specimen other than nasopharyngeal swab, presence of viral mutation(s) within the areas targeted by this assay, and inadequate number of viral copies(<138 copies/mL). A negative result must be combined with clinical observations, patient history, and epidemiological information. The expected result is Negative.  Fact Sheet for Patients:  EntrepreneurPulse.com.au  Fact Sheet for Healthcare Providers:  IncredibleEmployment.be  This test is no t yet approved or cleared by the [place] FDA and  has been authorized for detection and/or diagnosis of SARS-CoV-2 by FDA under an Emergency Use Authorization (EUA). This EUA will remain  in effect (meaning this test can be used) for the duration of the COVID-19 declaration under Section 564(b)(1) of the Act, 21 U.S.C.section 360bbb-3(b)(1), unless the authorization is terminated  or revoked sooner.       Influenza A by PCR NEGATIVE NEGATIVE Final   Influenza B by PCR NEGATIVE NEGATIVE Final    Comment: (NOTE) The Xpert Xpress SARS-CoV-2/FLU/RSV plus assay is intended as an aid in the diagnosis of influenza from Nasopharyngeal swab specimens and should not be used as a sole basis for treatment. Nasal washings and aspirates are unacceptable for Xpert Xpress SARS-CoV-2/FLU/RSV testing.  Fact Sheet for  Patients: EntrepreneurPulse.com.au  Fact Sheet for Healthcare Providers: IncredibleEmployment.be  This test is not yet approved or cleared by the [place] FDA and has been authorized for detection and/or diagnosis of SARS-CoV-2 by FDA under an Emergency Use Authorization (EUA). This EUA will remain in effect (meaning this test can be used) for the duration of the COVID-19 declaration under Section 564(b)(1) of the Act, 21 U.S.C. section 360bbb-3(b)(1), unless the authorization is terminated or revoked.  Performed at [facility], [place] [address] [place],  [address]      Medications:    carvedilol  3.125 mg Oral BID WC   citalopram  20 mg Oral Daily   dicyclomine  20 mg Oral TID AC   diphenhydrAMINE  12.5 mg Intravenous Once  fludrocortisone  100 mcg Oral BID   heparin  5,000 Units Subcutaneous Q8H   insulin aspart  0-5 Units Subcutaneous QHS   insulin aspart  0-9 Units Subcutaneous TID WC   insulin aspart  2 Units Subcutaneous TID WC   levothyroxine  88 mcg Oral Q0600   midodrine  10 mg Oral TID WC   mirtazapine  30 mg Oral QHS   pantoprazole  40 mg Oral Daily   Continuous Infusions:      LOS: 3 days   Charlynne Cousins  [facility]  10/11/2021, 9:21 AM

## 2021-10-12 DIAGNOSIS — R112 Nausea with vomiting, unspecified: Secondary | ICD-10-CM | POA: Diagnosis not present

## 2021-10-12 DIAGNOSIS — M25572 Pain in left ankle and joints of left foot: Secondary | ICD-10-CM | POA: Diagnosis not present

## 2021-10-12 DIAGNOSIS — N1832 Chronic kidney disease, stage 3b: Secondary | ICD-10-CM

## 2021-10-12 DIAGNOSIS — Z95810 Presence of automatic (implantable) cardiac defibrillator: Secondary | ICD-10-CM | POA: Diagnosis not present

## 2021-10-12 DIAGNOSIS — N179 Acute kidney failure, unspecified: Secondary | ICD-10-CM | POA: Diagnosis not present

## 2021-10-12 LAB — GLUCOSE, CAPILLARY: Glucose-Capillary: 129 mg/dL — ABNORMAL HIGH (ref 70–99)

## 2021-10-12 MED ORDER — PANTOPRAZOLE SODIUM 40 MG PO TBEC: 40.0000 mg | DELAYED_RELEASE_TABLET | Freq: Every day | ORAL | 3 refills | Status: DC

## 2021-10-12 MED ORDER — GABAPENTIN 300 MG PO CAPS: 300.0000 mg | ORAL_CAPSULE | Freq: Two times a day (BID) | ORAL | Status: DC

## 2021-10-12 NOTE — Progress Notes (Addendum)
IV removed and gauze dressing placed. AVS given to patient and husband. No questions after reviewing AVS discharge medications and instructions. Patient cell phone, glasses, cane and clothing returned.

## 2021-10-12 NOTE — Discharge Summary (Signed)
Physician Discharge Summary  Rhonda [name] WCH:[id number] DOB: 1950-11-15 DOA: 10/08/2021  PCP: [name], MD  Admit date: 10/08/2021 Discharge date: 10/12/2021  Admitted From: Home Disposition:  Home  Recommendations for Outpatient Follow-up:  Follow up with PCP in 1-2 weeks Please obtain BMP/CBC in one week   Home Health:No Equipment/Devices:None  Discharge Condition:Stable CODE STATUS:Dull Diet recommendation: Heart Healthy   Brief/Interim Summary: 71 y.o. 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  MRCP was ordered, GI has been consulted.  Discharge Diagnoses:  Principal Problem:   Intractable nausea and vomiting Active Problems:   Chronic systolic heart failure (HCC)   Morbid obesity (HCC)   Biventricular ICD (implantable cardioverter-defibrillator) in place   Chronic respiratory failure (HCC)   Hypothyroidism   Orthostatic hypotension   CKD (chronic kidney disease) stage 3, GFR 30-59 ml/min (HCC)   Diabetes mellitus with neuropathy (HCC)   Body mass index (BMI) 45.0-49.9, adult (HCC)   Cholelithiasis   Personal history of malignant neoplasm of breast   AKI (acute kidney injury) (HCC)   Hyponatremia   Acute left ankle pain  Intractable nausea and vomitin of unclear etiology: MRCP cannot be performed due to her pacemaker. HIDA  scan was done which was negative for hepatobiliary obstruction or cholecystitis. GI was consulted Recommend to continue Protonix p.o. daily low-fat diet and they will follow-up with the patient as an patient for an EUS.  Acute kidney injury: In setting of diuretic therapy and decreased oral intake likely prerenal. Baseline creatinine 1.8-1.6 diuretics were held she was fluid resuscitated and creatinine returned to baseline.  Chronic systolic heart failure: No change made to his medication continue current regimen.  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  Hypothyroidism: Continue Synthroid.  Hypovolemic hyponatremia: She was fluid resuscitated this resolved.  Orthostatic hypotension: She was fluid resuscitated Florinef and midodrine were resumed this resolved.  Diabetic neuropathy: She was continued on Neurontin.  Morbid obesity with a BMI greater than 45: Noted she has been counseled on lifestyle modification and diet.  Personal history of malignant neoplasm of the breast: History of triple negative breast cancer in 1999 follow-up with PCP as an outpatient.  Next  Left ankle pain likely a sprain: X-ray was some soft tissue swelling with bony abnormality.  Discharge Instructions  Discharge Instructions     Diet - low sodium heart healthy   Complete by: As directed    Increase activity slowly   Complete by: As directed       Allergies as of 10/12/2021       Reactions   Ceftin Anaphylaxis   Face and throat swell    Geodon [ziprasidone Hcl] Hives   Lisinopril Other (See Comments)   angioedema   Shellfish Allergy Other (See Comments)   Gout exacerbation   Shellfish-derived Products    Other reaction(s): Other   Cefuroxime    Other reaction(s): anaphylaxis  Sulfacetamide Sodium-sulfur    Other reaction(s): itch   Ziprasidone    Other reaction(s): hives on left arm   Allopurinol Nausea Only, Other (See Comments)   weakness   Ativan [lorazepam] Itching   Sulfa  Antibiotics Itching   Ultram [tramadol Hcl] Itching   Ultram [tramadol] Itching   Valium [diazepam] Other (See Comments)   Patient states that diazepam doesn't relax, it has the opposite effect.        Medication List     STOP taking these medications    multivitamin with minerals Tabs tablet       TAKE these medications    acetaminophen 500 MG tablet Commonly known as: TYLENOL Take 1,000 mg by mouth every 6 (six) hours as needed for moderate pain.   acetaZOLAMIDE 125 MG tablet Commonly known as: DIAMOX Take 1 tablet (125 mg total) by mouth daily.   carvedilol 3.125 MG tablet Commonly known as: COREG TAKE 1 TABLET (3.125 MG) BY MOUTH TWICE DAILY WITH MEALS What changed:  how much to take how to take this when to take this additional instructions   citalopram 40 MG tablet Commonly known as: CELEXA Take 40 mg by mouth daily.   clonazePAM 0.5 MG tablet Commonly known as: KLONOPIN Take 0.5 mg by mouth daily as needed for anxiety.   DAYQUIL/NYQUIL COLD/FLU RELIEF PO Take 15 mLs by mouth at bedtime as needed (sleep).   dicyclomine 20 MG tablet Commonly known as: BENTYL Take 20 mg by mouth 3 (three) times daily before meals.   fludrocortisone 0.1 MG tablet Commonly known as: FLORINEF Take 1 tablet (100 mcg total) by mouth 2 (two) times daily.   fluticasone 50 MCG/ACT nasal spray Commonly known as: FLONASE Place 1 spray into both nostrils daily as needed for allergies or rhinitis.   gabapentin 800 MG tablet Commonly known as: NEURONTIN TAKE 1 TABLET AT BEDTIME AS NEEDED FOR NEUROPATHY What changed: Another medication with the same name was changed. Make sure you understand how and when to take each.   gabapentin 300 MG capsule Commonly known as: NEURONTIN Take 1 capsule (300 mg total) by mouth 2 (two) times daily. What changed: how much to take   GAS-X MAXIMUM STRENGTH PO Take 1 tablet by mouth daily as needed (flatulance).   levothyroxine 88 MCG  tablet Commonly known as: SYNTHROID TAKE 1 TABLET(88 MCG) BY MOUTH DAILY BEFORE BREAKFAST What changed:  how much to take how to take this when to take this additional instructions   loperamide 2 MG tablet Commonly known as: IMODIUM A-D Take 2 mg by mouth 4 (four) times daily as needed for diarrhea or loose stools.   metolazone 2.5 MG tablet Commonly known as: ZAROXOLYN Take 1 tablet by mouth every other Tuesday. What changed:  how much to take how to take this when to take this   midodrine 10 MG tablet Commonly known as: PROAMATINE TAKE 1 TABLET THREE TIMES DAILY WITH MEALS What changed:  how much to take how to take this when to take this additional instructions   potassium chloride SA 20 MEQ tablet Commonly known as: KLOR-CON M Take 3 tablets (60 mEq total) by mouth 3 (three) times daily.   simvastatin 10 MG tablet Commonly known as: ZOCOR Take 1 tablet (10 mg total) by mouth every evening.   spironolactone 25 MG tablet Commonly known as: ALDACTONE Take 1 tablet (25 mg total) by mouth every evening. Needs appt for further refills   torsemide 20 MG tablet Commonly known  as: DEMADEX Take 4 tablets (80 mg total) by mouth every morning AND 3 tablets (60 mg total) every evening.        Allergies  Allergen Reactions   Ceftin Anaphylaxis    Face and throat swell    Geodon [Ziprasidone Hcl] Hives   Lisinopril Other (See Comments)    angioedema   Shellfish Allergy Other (See Comments)    Gout exacerbation   Shellfish-Derived Products     Other reaction(s): Other   Cefuroxime     Other reaction(s): anaphylaxis   Sulfacetamide Sodium-Sulfur     Other reaction(s): itch   Ziprasidone     Other reaction(s): hives on left arm   Allopurinol Nausea Only and Other (See Comments)    weakness   Ativan [Lorazepam] Itching   Sulfa Antibiotics Itching   Ultram [Tramadol Hcl] Itching   Ultram [Tramadol] Itching   Valium [Diazepam] Other (See Comments)    Patient  states that diazepam doesn't relax, it has the opposite effect.    Consultations: Gastroenterology   Procedures/Studies: CT Abdomen Pelvis Wo Contrast  Result Date: 10/06/2021 CLINICAL DATA:  Acute generalized abdominal pain. EXAM: CT ABDOMEN AND PELVIS WITHOUT CONTRAST TECHNIQUE: Multidetector CT imaging of the abdomen and pelvis was performed following the standard protocol without IV contrast. RADIATION DOSE REDUCTION: This exam was performed according to the departmental dose-optimization program which includes automated exposure control, adjustment of the mA and/or kV according to patient size and/or use of iterative reconstruction technique. COMPARISON:  May 30, 2018.  August 06, 2018. FINDINGS: Lower chest: No acute abnormality. Hepatobiliary: Mild cholelithiasis is noted. Liver is unremarkable on these unenhanced images. No intrahepatic biliary dilatation is noted. However, dilated common bile duct is noted which was present on prior exam 2020. Distal common bile duct obstruction cannot be excluded. Pancreas: Unremarkable. No pancreatic ductal dilatation or surrounding inflammatory changes. Spleen: Normal in size without focal abnormality. Adrenals/Urinary Tract: Adrenal glands are unremarkable. Kidneys are normal, without renal calculi, focal lesion, or hydronephrosis. Bladder is unremarkable. Stomach/Bowel: Stomach is within normal limits. Appendix appears normal. No evidence of bowel wall thickening, distention, or inflammatory changes. Vascular/Lymphatic: Aortic atherosclerosis. No enlarged abdominal or pelvic lymph nodes. Reproductive: Stable posterior fibroid noted on prior exam. No adnexal abnormality is noted. Other: Small fat containing periumbilical hernia is noted. No ascites is noted. Musculoskeletal: No acute or significant osseous findings. IMPRESSION: Cholelithiasis is noted without evidence of cholecystitis. However, dilated common bile duct is again noted which was present on  prior exam of 2020, although no intrahepatic biliary dilatation is noted. Correlation with liver function tests is recommended to rule out distal common bile duct obstruction. Uterine fibroid is again noted. Small fat containing periumbilical hernia. Aortic Atherosclerosis (ICD10-I70.0). Electronically Signed   By: Marijo Conception M.D.   On: 10/06/2021 17:42   DG Chest 2 View  Result Date: 10/05/2021 CLINICAL DATA:  Chest pain with nausea and vomiting. EXAM: CHEST - 2 VIEW COMPARISON:  June 03, 2021 FINDINGS: There is a multi lead AICD. The heart size and mediastinal contours are within normal limits. There is marked severity calcification of the aortic arch. Low lung volumes are seen. There is no evidence of acute infiltrate, pleural effusion or pneumothorax. Multilevel degenerative changes seen throughout the thoracic spine. IMPRESSION: Low lung volumes without evidence of acute or active cardiopulmonary disease. Electronically Signed   By: Virgina Norfolk M.D.   On: 10/05/2021 23:08   DG Ankle 2 Views Left  Result Date: 10/11/2021 CLINICAL DATA:  Acute LEFT ankle pain. No known injury. Initial encounter. EXAM: LEFT ANKLE - 2 VIEW COMPARISON:  06/09/2016 foot radiograph FINDINGS: No acute fracture, subluxation or dislocation identified. Pes planus and midfoot degenerative changes again noted. Soft tissue swelling is present. No definite ankle effusion noted. Arterial vascular calcifications and plantar calcaneal spur again noted. IMPRESSION: 1. Soft tissue swelling without acute bony abnormality. 2. Pes planus and midfoot degenerative changes. Electronically Signed   By: Margarette Canada M.D.   On: 10/11/2021 12:02   NM Hepatobiliary Liver Func  Result Date: 10/10/2021 CLINICAL DATA:  Right upper quadrant pain. Cholelithiasis and biliary ductal dilatation on recent ultrasound. EXAM: NUCLEAR MEDICINE HEPATOBILIARY IMAGING TECHNIQUE: Sequential images of the abdomen were obtained out to 60 minutes  following intravenous administration of radiopharmaceutical. Due to lack of visualization of the gallbladder at 60 minutes, slow intravenous infusion of 3.0 mg morphine was performed during continued imaging for another 30 minutes. RADIOPHARMACEUTICALS:  5.3 mCi Tc-26m Choletec IV COMPARISON:  None Available. FINDINGS: Prompt uptake and biliary excretion of activity by the liver is seen. Biliary activity passes promptly into small bowel, consistent with patent common bile duct. Gallbladder activity was not visualized during the initial 60 minutes, but gallbladder activity was demonstrated following intravenous administration of morphine. IMPRESSION: Negative hepatobiliary scan. No evidence of acute cholecystitis or biliary obstruction. Electronically Signed   By: JMarlaine HindM.D.   On: 10/10/2021 17:20   UKoreaAbdomen Limited  Result Date: 10/06/2021 CLINICAL DATA:  RIGHT upper quadrant pain in a 71year old female. EXAM: ULTRASOUND ABDOMEN LIMITED RIGHT UPPER QUADRANT COMPARISON:  Comparison made with Oct 06, 2018 and CT of the abdomen and pelvis dated Oct 06, 2021. FINDINGS: Gallbladder: Sludge and stones within the gallbladder lumen. No reported tenderness over the gallbladder during the examination. No signs of wall thickening. No signs of pericholecystic fluid. Gallstones and an area suspected to represent tumefactive sludge are large, largest 1.6 cm. Some of these are in the neck of the gallbladder. Common bile duct: Diameter: 8.9 mm Signs of intrahepatic biliary duct distension. Liver: No visible intrahepatic lesion. Hepatic echogenicity is grossly normal on limited assessment due to patient body habitus. Portal vein is patent on color Doppler imaging with normal direction of blood flow towards the liver. Other: None. IMPRESSION: Cholelithiasis with no reported tenderness over the gallbladder but with moderate biliary duct distension both intra and extrahepatic suspected. Choledocholithiasis is not  excluded. MRI/MRCP may be helpful as warranted for further assessment. Electronically Signed   By: GZetta BillsM.D.   On: 10/06/2021 18:47   DG Chest Portable 1 View  Result Date: 10/08/2021 CLINICAL DATA:  sob EXAM: PORTABLE CHEST 1 VIEW COMPARISON:  None Available. FINDINGS: LEFT-sided pacer overlies normal cardiac silhouette. No effusion, infiltrate, or pneumothorax. No acute osseous abnormality. IMPRESSION: No acute cardiopulmonary process. Electronically Signed   By: SSuzy BouchardM.D.   On: 10/08/2021 13:10   DG Chest Port 1 View  Result Date: 10/06/2021 CLINICAL DATA:  Shortness of breath. EXAM: PORTABLE CHEST 1 VIEW COMPARISON:  Chest x-ray 10/05/2021 FINDINGS: Left-sided ICD again seen. The heart is enlarged, unchanged. Left axillary surgical clips are again seen. Both lungs are clear. The visualized skeletal structures are unremarkable. IMPRESSION: 1. Stable cardiomegaly. 2. No acute cardiopulmonary process. Electronically Signed   By: ARonney AstersM.D.   On: 10/06/2021 17:24   (Echo, Carotid, EGD, Colonoscopy, ERCP)    Subjective: No complaints  Discharge Exam: Vitals:   10/12/21 0621 10/12/21 0846  BP: (Marland Kitchen  142/71 (!) 145/69  Pulse: 72 75  Resp: 18   Temp: 98.2 F (36.8 C)   SpO2: 98%    Vitals:   10/11/21 1427 10/11/21 2058 10/12/21 0621 10/12/21 0846  BP: 103/72 (!) 157/104 (!) 142/71 (!) 145/69  Pulse: 70 79 72 75  Resp: (!) '23 18 18   '$ Temp: 98.1 F (36.7 C) 98.9 F (37.2 C) 98.2 F (36.8 C)   TempSrc: Oral Oral Oral   SpO2: 100% 96% 98%     General: Pt is alert, awake, not in acute distress Cardiovascular: RRR, S1/S2 +, no rubs, no gallops Respiratory: CTA bilaterally, no wheezing, no rhonchi Abdominal: Soft, NT, ND, bowel sounds + Extremities: no edema, no cyanosis    The results of significant diagnostics from this hospitalization (including imaging, microbiology, ancillary and laboratory) are listed below for reference.      Microbiology: Recent Results (from the past 240 hour(s))  Resp Panel by RT-PCR (Flu A&B, Covid) Nasopharyngeal Swab     Status: None   Collection Time: 10/06/21  5:48 PM   Specimen: Nasopharyngeal Swab; Nasopharyngeal(NP) swabs in vial transport medium  Result Value Ref Range Status   SARS Coronavirus 2 by RT PCR NEGATIVE NEGATIVE Final    Comment: (NOTE) SARS-CoV-2 target nucleic acids are NOT DETECTED.  The SARS-CoV-2 RNA is generally detectable in upper respiratory specimens during the acute phase of infection. The lowest concentration of SARS-CoV-2 viral copies this assay can detect is 138 copies/mL. A negative result does not preclude SARS-Cov-2 infection and should not be used as the sole basis for treatment or other patient management decisions. A negative result may occur with  improper specimen collection/handling, submission of specimen other than nasopharyngeal swab, presence of viral mutation(s) within the areas targeted by this assay, and inadequate number of viral copies(<138 copies/mL). A negative result must be combined with clinical observations, patient history, and epidemiological information. The expected result is Negative.  Fact Sheet for Patients:  EntrepreneurPulse.com.au  Fact Sheet for Healthcare Providers:  IncredibleEmployment.be  This test is no t yet approved or cleared by the [place] FDA and  has been authorized for detection and/or diagnosis of SARS-CoV-2 by FDA under an Emergency Use Authorization (EUA). This EUA will remain  in effect (meaning this test can be used) for the duration of the COVID-19 declaration under Section 564(b)(1) of the Act, 21 U.S.C.section 360bbb-3(b)(1), unless the authorization is terminated  or revoked sooner.       Influenza A by PCR NEGATIVE NEGATIVE Final   Influenza B by PCR NEGATIVE NEGATIVE Final    Comment: (NOTE) The Xpert Xpress SARS-CoV-2/FLU/RSV plus assay is  intended as an aid in the diagnosis of influenza from Nasopharyngeal swab specimens and should not be used as a sole basis for treatment. Nasal washings and aspirates are unacceptable for Xpert Xpress SARS-CoV-2/FLU/RSV testing.  Fact Sheet for Patients: EntrepreneurPulse.com.au  Fact Sheet for Healthcare Providers: IncredibleEmployment.be  This test is not yet approved or cleared by the [place] FDA and has been authorized for detection and/or diagnosis of SARS-CoV-2 by FDA under an Emergency Use Authorization (EUA). This EUA will remain in effect (meaning this test can be used) for the duration of the COVID-19 declaration under Section 564(b)(1) of the Act, 21 U.S.C. section 360bbb-3(b)(1), unless the authorization is terminated or revoked.  Performed at [facility], [place] [address], [place], [place] [address]      Labs: BNP (last 3 results) Recent Labs    03/19/21 1452  04/23/21 1522 10/08/21 1103  BNP 68.6 40.0 09.3   Basic Metabolic Panel: Recent Labs  Lab 10/05/21 2227 10/06/21 1354 10/08/21 1103 10/09/21 0543 10/10/21 1053  NA 134* 134* 133* 136 140  K 3.6 5.0 4.3 3.8 3.7  CL 101 99 100 109 113*  CO2 22 23 20* 20* 20*  GLUCOSE 166* 166* 157* 113* 140*  BUN 34* 42* 36* 33* 18  CREATININE 1.78* 2.04* 2.05* 1.47* 1.11*  CALCIUM 9.9 9.9 9.6 8.5* 9.0   Liver Function Tests: Recent Labs  Lab 10/05/21 2227 10/06/21 1354 10/08/21 1103 10/09/21 0543 10/10/21 1053  AST 31 33 33 27 22  ALT 39 36 36 30 27  ALKPHOS 259* 242* 221* 186* 193*  BILITOT 1.0 0.8 0.5 0.7 0.6  PROT 9.2* 9.4* 8.6* 6.7 7.1  ALBUMIN 3.7 4.1 4.1 3.0* 3.1*   Recent Labs  Lab 10/05/21 2227 10/06/21 1354 10/08/21 1103  LIPASE '25 31 18   '$ No results for input(s): AMMONIA in the last 168 hours. CBC: Recent Labs  Lab 10/05/21 2227 10/06/21 1354 10/08/21 1103 10/09/21 0543  WBC 12.5* 12.7* 9.6 8.1  NEUTROABS  --  9.9*   --   --   HGB 14.6 14.8 13.5 11.6*  HCT 43.8 43.8 41.0 33.9*  MCV 98.9 100.0 98.1 101.8*  PLT 327 308 260 194   Cardiac Enzymes: No results for input(s): CKTOTAL, CKMB, CKMBINDEX, TROPONINI in the last 168 hours. BNP: Invalid input(s): POCBNP CBG: Recent Labs  Lab 10/11/21 0715 10/11/21 1202 10/11/21 1651 10/11/21 2056 10/12/21 0813  GLUCAP 108* 177* 143* 172* 129*   D-Dimer No results for input(s): DDIMER in the last 72 hours. Hgb A1c Recent Labs    10/09/21 1049  HGBA1C 7.0*   Lipid Profile No results for input(s): CHOL, HDL, LDLCALC, TRIG, CHOLHDL, LDLDIRECT in the last 72 hours. Thyroid function studies No results for input(s): TSH, T4TOTAL, T3FREE, THYROIDAB in the last 72 hours.  Invalid input(s): FREET3 Anemia work up No results for input(s): VITAMINB12, FOLATE, FERRITIN, TIBC, IRON, RETICCTPCT in the last 72 hours. Urinalysis    Component Value Date/Time   COLORURINE YELLOW 10/06/2021 1620   APPEARANCEUR CLOUDY (A) 10/06/2021 1620   LABSPEC 1.014 10/06/2021 1620   LABSPEC 1.015 11/13/2010 1331   PHURINE 5.0 10/06/2021 1620   GLUCOSEU NEGATIVE 10/06/2021 1620   HGBUR NEGATIVE 10/06/2021 1620   BILIRUBINUR NEGATIVE 10/06/2021 1620   BILIRUBINUR Negative 11/13/2010 1331   KETONESUR NEGATIVE 10/06/2021 1620   PROTEINUR NEGATIVE 10/06/2021 1620   UROBILINOGEN 0.2 05/05/2012 1029   NITRITE NEGATIVE 10/06/2021 1620   LEUKOCYTESUR LARGE (A) 10/06/2021 1620   LEUKOCYTESUR Moderate 11/13/2010 1331   Sepsis Labs Invalid input(s): PROCALCITONIN,  WBC,  LACTICIDVEN Microbiology Recent Results (from the past 240 hour(s))  Resp Panel by RT-PCR (Flu A&B, Covid) Nasopharyngeal Swab     Status: None   Collection Time: 10/06/21  5:48 PM   Specimen: Nasopharyngeal Swab; Nasopharyngeal(NP) swabs in vial transport medium  Result Value Ref Range Status   SARS Coronavirus 2 by RT PCR NEGATIVE NEGATIVE Final    Comment: (NOTE) SARS-CoV-2 target nucleic acids are NOT  DETECTED.  The SARS-CoV-2 RNA is generally detectable in upper respiratory specimens during the acute phase of infection. The lowest concentration of SARS-CoV-2 viral copies this assay can detect is 138 copies/mL. A negative result does not preclude SARS-Cov-2 infection and should not be used as the sole basis for treatment or other patient management decisions. A negative result may occur with  improper specimen collection/handling, submission of specimen other than nasopharyngeal swab, presence of viral mutation(s) within the areas targeted by this assay, and inadequate number of viral copies(<138 copies/mL). A negative result must be combined with clinical observations, patient history, and epidemiological information. The expected result is Negative.  Fact Sheet for Patients:  EntrepreneurPulse.com.au  Fact Sheet for Healthcare Providers:  IncredibleEmployment.be  This test is no t yet approved or cleared by the [place] FDA and  has been authorized for detection and/or diagnosis of SARS-CoV-2 by FDA under an Emergency Use Authorization (EUA). This EUA will remain  in effect (meaning this test can be used) for the duration of the COVID-19 declaration under Section 564(b)(1) of the Act, 21 U.S.C.section 360bbb-3(b)(1), unless the authorization is terminated  or revoked sooner.       Influenza A by PCR NEGATIVE NEGATIVE Final   Influenza B by PCR NEGATIVE NEGATIVE Final    Comment: (NOTE) The Xpert Xpress SARS-CoV-2/FLU/RSV plus assay is intended as an aid in the diagnosis of influenza from Nasopharyngeal swab specimens and should not be used as a sole basis for treatment. Nasal washings and aspirates are unacceptable for Xpert Xpress SARS-CoV-2/FLU/RSV testing.  Fact Sheet for Patients: EntrepreneurPulse.com.au  Fact Sheet for Healthcare Providers: IncredibleEmployment.be  This test is not yet  approved or cleared by the [place] FDA and has been authorized for detection and/or diagnosis of SARS-CoV-2 by FDA under an Emergency Use Authorization (EUA). This EUA will remain in effect (meaning this test can be used) for the duration of the COVID-19 declaration under Section 564(b)(1) of the Act, 21 U.S.C. section 360bbb-3(b)(1), unless the authorization is terminated or revoked.  Performed at [facility], [place] [address] [place], [place] [address]      SIGNED:   Charlynne Cousins, MD  [facility] 10/12/2021, 9:19 AM Pager   If 7PM-7AM, please contact night-coverage [email or web address] Password [email or web address]

## 2021-10-13 ENCOUNTER — Ambulatory Visit (INDEPENDENT_AMBULATORY_CARE_PROVIDER_SITE_OTHER): Payer: Medicare HMO

## 2021-10-13 ENCOUNTER — Other Ambulatory Visit (HOSPITAL_COMMUNITY): Payer: Self-pay

## 2021-10-13 DIAGNOSIS — I5032 Chronic diastolic (congestive) heart failure: Secondary | ICD-10-CM | POA: Diagnosis not present

## 2021-10-13 DIAGNOSIS — E039 Hypothyroidism, unspecified: Secondary | ICD-10-CM | POA: Diagnosis not present

## 2021-10-13 DIAGNOSIS — I10 Essential (primary) hypertension: Secondary | ICD-10-CM | POA: Diagnosis not present

## 2021-10-13 DIAGNOSIS — F330 Major depressive disorder, recurrent, mild: Secondary | ICD-10-CM | POA: Diagnosis not present

## 2021-10-13 DIAGNOSIS — E785 Hyperlipidemia, unspecified: Secondary | ICD-10-CM | POA: Diagnosis not present

## 2021-10-13 DIAGNOSIS — Z95810 Presence of automatic (implantable) cardiac defibrillator: Secondary | ICD-10-CM

## 2021-10-13 DIAGNOSIS — M1990 Unspecified osteoarthritis, unspecified site: Secondary | ICD-10-CM | POA: Diagnosis not present

## 2021-10-13 DIAGNOSIS — J45909 Unspecified asthma, uncomplicated: Secondary | ICD-10-CM | POA: Diagnosis not present

## 2021-10-13 DIAGNOSIS — K219 Gastro-esophageal reflux disease without esophagitis: Secondary | ICD-10-CM | POA: Diagnosis not present

## 2021-10-13 DIAGNOSIS — E1140 Type 2 diabetes mellitus with diabetic neuropathy, unspecified: Secondary | ICD-10-CM | POA: Diagnosis not present

## 2021-10-13 DIAGNOSIS — N1832 Chronic kidney disease, stage 3b: Secondary | ICD-10-CM | POA: Diagnosis not present

## 2021-10-13 NOTE — Progress Notes (Signed)
Paramedicine Encounter    Patient ID: Rhonda [name], female    DOB: 1950-12-08, 71 y.o.   MRN: [id number]    Arrived for home visit for Rhonda [name] who was discharged from the hospital yesterday. She was laying on her side in the bed alert and oriented stating she felt okay just sick to her stomach but has not actually vomited. She denied headache or diarrhea and no abdominal pain. Abdomen is soft-non tender.    She was seen at [facility] in clinic last week after our visit for headache, abdominal pain and nausea, vomiting, diarrhea. [facility]. She was then d/c and seen by her PCP on Wednesday and they sent her to [facility] for and then transferred to [facility] for admit. She was sent home with instructions to begin taking Pantoprazole daily and to follow up with PCP as well as obtaining a GI referral from PCP. PCP appointment scheduled for Wednesday 5/24 at 1:00 with Dr. [name]. I made notes of current med list and questions to ask PCP upon their visit. Rhonda [name]'s husband Ed knows they can reach out to me if needed.   Rhonda [name] reports feeling better today just nauseated and fatigued. I obtained vitals and assessment. She denied shortness of breath, lungs clear. No chest pain or dizziness reported. No lower leg edema noted.   I reviewed all medications and verified same filling pill box for one week giving her morning meds to her during our visit.   We confirmed appointments and I plan to see her in one week in the home.  Rhonda [name] agreed and knows to reach out sooner if needed.   Refills noted for PCP to send in to [facility]: Celexa  Clonazepam   Home visit complete.   Patient Care Team: [name], MD as PCP - General (Family Medicine) [name], LCSW as Social Worker (Licensed Clinical Social Worker) [name], [name], DO as Consulting Physician (Neurology) [name], EMT as Paramedic [name] [name], MD as Attending Physician (Family  Medicine)  Patient Active Problem List   Diagnosis Date Noted   Acute left ankle pain 10/11/2021   Intractable nausea and vomiting 10/08/2021   AKI (acute kidney injury) ([place]) 10/08/2021   Hyponatremia 10/08/2021   Decreased estrogen level 08/01/2021   Dementia ([place]) 08/01/2021   Frail elderly 08/01/2021   Hyperparathyroidism ([place]) 08/01/2021   Spondylolisthesis 08/01/2021   Varicose veins of bilateral lower extremities with other complications 95/62/1308   Lumbago without sciatica 04/30/2021   Abnormal gait 06/11/2020   Adult failure to thrive syndrome 06/11/2020   Allergic rhinitis 06/11/2020   Anxiety 06/11/2020   Asthma 06/11/2020   Benign intracranial hypertension 06/11/2020   Body mass index (BMI) 45.0-49.9, adult ([place]) 06/11/2020   Bowel incontinence 06/11/2020   Cholelithiasis 06/11/2020   Chronic sinusitis 06/11/2020   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   Daytime somnolence 06/11/2020   Diarrhea of presumed infectious origin 06/11/2020   Edema 06/11/2020   Family history of malignant neoplasm of gastrointestinal tract 06/11/2020   Gout 06/11/2020   History of fall 06/11/2020   History of infectious disease 06/11/2020   Insomnia 06/11/2020   Irregular bowel habits 06/11/2020   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   Lumbar spondylosis with myelopathy 06/11/2020   Macrocytosis 06/11/2020   Mild recurrent major depression ([place]) 06/11/2020   Personal history of colonic polyps 06/11/2020   Personal history of malignant neoplasm of breast 06/11/2020   Repeated falls 06/11/2020   Mixed anxiety and depressive disorder  06/11/2020   Irritable bowel syndrome 01/04/2020   Spinal stenosis of lumbar region 01/03/2020   Other symptoms and signs involving the musculoskeletal system 09/11/2019   Bilateral leg weakness 08/01/2019   Leg swelling 08/01/2019   Diabetes mellitus with neuropathy ([place]) 06/15/2019   Hypokalemia 11/08/2018   CKD (chronic kidney disease) stage 3, GFR 30-59 ml/min (HCC)  11/08/2018   Orthostatic hypotension 07/28/2017   SVD (spontaneous vaginal delivery)    Peripheral neuropathy    On home oxygen therapy    Migraines    Left bundle branch block    Hypothyroidism    Hypertension    Hyperlipidemia    Heart murmur    GERD (gastroesophageal reflux disease)    Exertional shortness of breath    Major depressive disorder    Back pain    Arthritis    Generalized anxiety disorder    Anemia    Chronic respiratory failure ([place]) 09/14/2013   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   Morbid obesity ([place]) 53/97/6734   Chronic systolic heart failure ([place]) 10/27/2012   Endometrial polyp 01/20/2012   Malignant tumor of breast ([place]) 03/26/2011   Unspecified vitamin D deficiency 03/26/2011    Current Outpatient Medications:    acetaminophen (TYLENOL) 500 MG tablet, Take 1,000 mg by mouth every 6 (six) hours as needed for moderate pain., Disp: , Rfl:    acetaZOLAMIDE (DIAMOX) 125 MG tablet, Take 1 tablet (125 mg total) by mouth daily., Disp: 90 tablet, Rfl: 3   carvedilol (COREG) 3.125 MG tablet, TAKE 1 TABLET (3.125 MG) BY MOUTH TWICE DAILY WITH MEALS (Patient taking differently: Take 3.125 mg by mouth 2 (two) times daily with a meal.), Disp: 180 tablet, Rfl: 3   citalopram (CELEXA) 40 MG tablet, Take 40 mg by mouth daily., Disp: , Rfl:    clonazePAM (KLONOPIN) 0.5 MG tablet, Take 0.5 mg by mouth daily as needed for anxiety., Disp: , Rfl:    dicyclomine (BENTYL) 20 MG tablet, Take 20 mg by mouth 3 (three) times daily before meals., Disp: , Rfl:    fludrocortisone (FLORINEF) 0.1 MG tablet, Take 1 tablet (100 mcg total) by mouth 2 (two) times daily. (Patient not taking: Reported on 08/19/2021), Disp: 180 tablet, Rfl: 3   fluticasone (FLONASE) 50 MCG/ACT nasal spray, Place 1 spray into both nostrils daily as needed for allergies or rhinitis., Disp: , Rfl:    gabapentin (NEURONTIN) 300 MG capsule, Take 1 capsule (300 mg total) by  mouth 2 (two) times daily., Disp: , Rfl:    gabapentin (NEURONTIN) 800 MG tablet, TAKE 1 TABLET AT BEDTIME AS NEEDED FOR NEUROPATHY (Patient not taking: Reported on 10/08/2021), Disp: 30 tablet, Rfl: 1   levothyroxine (SYNTHROID) 88 MCG tablet, TAKE 1 TABLET(88 MCG) BY MOUTH DAILY BEFORE BREAKFAST (Patient taking differently: Take 88 mcg by mouth daily before breakfast.), Disp: 90 tablet, Rfl: 3   loperamide (IMODIUM A-D) 2 MG tablet, Take 2 mg by mouth 4 (four) times daily as needed for diarrhea or loose stools., Disp: , Rfl:    metolazone (ZAROXOLYN) 2.5 MG tablet, Take 1 tablet by mouth every other Tuesday. (Patient taking differently: Take 2.5 mg by mouth See admin instructions. Take 1 tablet by mouth every other Tuesday.), Disp: 12 tablet, Rfl: 3   midodrine (PROAMATINE) 10 MG tablet, TAKE 1 TABLET THREE TIMES DAILY WITH MEALS (Patient taking differently: Take 10 mg by mouth 3 (three) times daily.), Disp: 270 tablet, Rfl: 3   pantoprazole (PROTONIX) 40 MG tablet, Take  1 tablet (40 mg total) by mouth daily., Disp: 30 tablet, Rfl: 3   potassium chloride SA (KLOR-CON M) 20 MEQ tablet, Take 3 tablets (60 mEq total) by mouth 3 (three) times daily., Disp: 572 tablet, Rfl: 0   Pseudoeph-Doxylamine-DM-APAP (DAYQUIL/NYQUIL COLD/FLU RELIEF PO), Take 15 mLs by mouth at bedtime as needed (sleep)., Disp: , Rfl:    Simethicone (GAS-X MAXIMUM STRENGTH PO), Take 1 tablet by mouth daily as needed (flatulance)., Disp: , Rfl:    simvastatin (ZOCOR) 10 MG tablet, Take 1 tablet (10 mg total) by mouth every evening., Disp: 30 tablet, Rfl: 11   spironolactone (ALDACTONE) 25 MG tablet, Take 1 tablet (25 mg total) by mouth every evening. Needs appt for further refills, Disp: 90 tablet, Rfl: 0   torsemide (DEMADEX) 20 MG tablet, Take 4 tablets (80 mg total) by mouth every morning AND 3 tablets (60 mg total) every evening., Disp: 210 tablet, Rfl: 11 Allergies  Allergen Reactions   Ceftin Anaphylaxis    Face and throat  swell    Geodon [Ziprasidone Hcl] Hives   Lisinopril Other (See Comments)    angioedema   Shellfish Allergy Other (See Comments)    Gout exacerbation   Shellfish-Derived Products     Other reaction(s): Other   Cefuroxime     Other reaction(s): anaphylaxis   Sulfacetamide Sodium-Sulfur     Other reaction(s): itch   Ziprasidone     Other reaction(s): hives on left arm   Allopurinol Nausea Only and Other (See Comments)    weakness   Ativan [Lorazepam] Itching   Sulfa Antibiotics Itching   Ultram [Tramadol Hcl] Itching   Ultram [Tramadol] Itching   Valium [Diazepam] Other (See Comments)    Patient states that diazepam doesn't relax, it has the opposite effect.     Social History   Socioeconomic History   Marital status: Married    Spouse name: Not on file   Number of children: 2   Years of education: 32   Highest education level: Master's degree (e.g., MA, MS, MEng, MEd, MSW, MBA)  Occupational History   Occupation: retired  Tobacco Use   Smoking status: Former    Packs/day: 0.10    Years: 26.00    Pack years: 2.60    Types: Cigarettes    Quit date: 03/23/2021    Years since quitting: 0.5   Smokeless tobacco: Never  Vaping Use   Vaping Use: Never used  Substance and Sexual Activity   Alcohol use: No   Drug use: No   Sexual activity: Yes  Other Topics Concern   Not on file  Social History Narrative   Tobacco Use Cigarettes: Former Smoker, Quit in 2008   No Alcohol   No recreational drug use   Diet: Regular/Low Carb   Exercise: None   Occupation: disabled   Education: Research officer, political party, masters   Children: 2   Firearms: No   Therapist, art Use: 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      Social Determinants of Health   Financial Resource Strain: Not on file  Food Insecurity: Not on file  Transportation Needs: Not on file  Physical Activity: Not on file  Stress: Not on file  Social Connections: Not on file  Intimate Partner Violence:  Not on file    Physical Exam      Future Appointments  Date Time Provider [place]  11/05/2021  9:15 AM Marzetta Board, DPM [facility]  12/02/2021 10:00 AM Reed, Tiffany L, DO [facility] None  12/10/2021  7:15 AM [facility] DEVICE REMOTES [facility]  12/25/2021  2:30 PM [facility] PA/NP [facility] None  03/11/2022  7:15 AM [facility] DEVICE REMOTES [facility]  04/01/2022  1:00 PM Hazle Coca, PhD [facility] None  04/01/2022  2:00 PM [facility] NEUROPSYCH TECH [facility] None  04/08/2022  2:30 PM Hazle Coca, PhD [facility] None  04/15/2022  8:50 AM Narda Amber K, DO [facility] None  06/10/2022  7:15 AM [facility] DEVICE REMOTES [facility]  09/09/2022  7:15 AM [facility] DEVICE REMOTES [facility]  12/09/2022  7:15 AM [facility] DEVICE REMOTES [facility]     ACTION: Home visit completed

## 2021-10-14 ENCOUNTER — Other Ambulatory Visit (HOSPITAL_COMMUNITY): Payer: Self-pay

## 2021-10-14 NOTE — Progress Notes (Signed)
EPIC Encounter for ICM Monitoring  Patient Name: Rhonda [name] is a 71 y.o. female Date: 10/14/2021 Primary Care Physican: [name], MD Primary Cardiologist: Bensimhon Electrophysiologist: Santina Evans Pacing: 98.0%   07/30/2021  Weight: 230 lbs       Spoke with patient and heart failure questions reviewed.  Pt's had 3 ED visits with admission this month due to vomiting, diarrhea and dehydration.  She reports feeling a little better since last hospital admission but no appetite and little fluid intake.  Discussed increasing fluid intake to 64 oz daily.  Message sent to [name] [name] and will attempt to coordinate next home visit for manual remote transmission.     Optivol thoracic impedance suggesting possible small amount of dehydration.    Prescribed:  Patient participating in paramedicine program to assist with meds. Torsemide 20 mg 4 tablets (80 mg total) by mouth every morning and 3 tablets (60 mg total) every evening.   Potassium 20 meq 2 tablets (40 mEq total) by mouth 3 (three) times daily.  Take extra 2 tablets on days you take Metolazone. Metolazone 2.'5mg'$  1 tablet every other Tuesday. Spironolactone 25 mg take 1 tablet by mouth every evening.   Labs: 10/10/2021 Creatinine 1.11, BUN 18, Potassium 3.7, Sodium 140, GFR 54 10/09/2021 Creatinine 1.47, BUN 33, Potassium 3.8, Sodium 136, GFR 39  10/08/2021 Creatinine 2.05, BUN 36, Potassium 4.3, Sodium 133, GFR 26  10/06/2021 Creatinine 2.04, BUN 42, Potassium 5.0, Sodium 134, GFR 26  10/05/2021 Creatinine 1.78, BUN 34, Potassium 3.6, Sodium 134, GFR 31  09/04/2021 Creatinine 1.66, BUN 41, Potassium 3.7, Sodium 135, GFR 33 A complete set of results can be found in Results Review.   Recommendations:  Advised to drink 64 oz fluid daily.   Encouraged to call for changes in condition.   Follow-up plan: ICM clinic phone appointment on 10/21/2021 (manual) to recheck fluid levels.   91 day device  clinic remote transmission 12/10/2021.     EP/Cardiology Office Visits:   12/25/2021 with [facility] Clinic.  Recall 07/26/2021 with Dr Lovena Le.     Copy of ICM check sent to Dr. Lovena Le.  3 month ICM trend: 10/13/2021.    12-14 Month ICM trend:     [name], RN 10/14/2021 1:17 PM

## 2021-10-14 NOTE — Progress Notes (Signed)
Paramedicine Encounter    Patient ID: Rhonda [name], female    DOB: [birth year], 71 y.o.   MRN: [id number]   Chena called me and reported she dropped her pill box and needs help fixing it. I arrived to assist. I sorted out all medications and reviewed and confirmed same. I re-filled pillbox.    Sharman Cheek RN in [facility] reached out to Mrs. Colpitts and encouraged her to hydrate as she was noted to be dehydrated on their check. She was recently admitted with GI upset and has had a lot of diarrhea. I encouraged her to hydrate and to have PCP draw labs tomorrow and to send them to HF clinic for evaluation. Message left with PCP RN Enid Derry at [phone] for same.    Home visit complete.     ACTION: Home visit completed

## 2021-10-15 DIAGNOSIS — R1110 Vomiting, unspecified: Secondary | ICD-10-CM | POA: Diagnosis not present

## 2021-10-15 DIAGNOSIS — F339 Major depressive disorder, recurrent, unspecified: Secondary | ICD-10-CM | POA: Diagnosis not present

## 2021-10-20 ENCOUNTER — Other Ambulatory Visit (HOSPITAL_COMMUNITY): Payer: Self-pay

## 2021-10-20 NOTE — Progress Notes (Signed)
Paramedicine Encounter    Patient ID: Rhonda [name], female    DOB: [birth year], 71 y.o.   MRN: [id number]  Arrived for home visit for Rhonda [name] who reports feeling better today than she did last week. She was up walking around her kitchen on my arrival. She did not appear short of breath or in any distress.   She states she has not had any of her medications today. I reviewed and confirmed same. I gave Doralene her before breakfast and set out her morning meds during our visit.   She has been off her Cymbalta for two weeks but reports severe depression and wishes to restart it. PCP office closed today but I left a message with RN on her voicemail indicating [name] refuses to completely stop her Cymbalta. She took '20mg'$  today and wanted me to place '20mg'$  capsule daily in the morning in her pill box. I advised her if she has any symptoms or side effects to call me.   Meds needed from [facility]: Celexa - none in pill box for this week Clonzepam- none in pill box for this week Cymbalta Levothyroxine   We reviewed upcoming appointments and confirmed same. I made notes on her calendar for same.   Weight today 209 Vitals as noted.  Assessment completed- no lower leg swelling, lungs clear, no pain complaints.   I plan to see [name] in one week, she agreed with plan. Home visit complete.   [name], [place] 10/20/2021   Patient Care Team: [name], MD as PCP - General (Family Medicine) [name], LCSW as Social Worker (Licensed Clinical Social Worker) [name], [name], DO as Consulting Physician (Neurology) [name], EMT as Paramedic [name] [name], MD as Attending Physician (Family Medicine)  Patient Active Problem List   Diagnosis Date Noted   Acute left ankle pain 10/11/2021   Intractable nausea and vomiting 10/08/2021   AKI (acute kidney injury) ([place]) 10/08/2021   Hyponatremia 10/08/2021   Decreased estrogen level  08/01/2021   Dementia ([place]) 08/01/2021   Frail elderly 08/01/2021   Hyperparathyroidism ([place]) 08/01/2021   Spondylolisthesis 08/01/2021   Varicose veins of bilateral lower extremities with other complications 25/36/6440   Lumbago without sciatica 04/30/2021   Abnormal gait 06/11/2020   Adult failure to thrive syndrome 06/11/2020   Allergic rhinitis 06/11/2020   Anxiety 06/11/2020   Asthma 06/11/2020   Benign intracranial hypertension 06/11/2020   Body mass index (BMI) 45.0-49.9, adult ([place]) 06/11/2020   Bowel incontinence 06/11/2020   Cholelithiasis 06/11/2020   Chronic sinusitis 06/11/2020   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   Daytime somnolence 06/11/2020   Diarrhea of presumed infectious origin 06/11/2020   Edema 06/11/2020   Family history of malignant neoplasm of gastrointestinal tract 06/11/2020   Gout 06/11/2020   History of fall 06/11/2020   History of infectious disease 06/11/2020   Insomnia 06/11/2020   Irregular bowel habits 06/11/2020   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   Lumbar spondylosis with myelopathy 06/11/2020   Macrocytosis 06/11/2020   Mild recurrent major depression ([place]) 06/11/2020   Personal history of colonic polyps 06/11/2020   Personal history of malignant neoplasm of breast 06/11/2020   Repeated falls 06/11/2020   Mixed anxiety and depressive disorder 06/11/2020   Irritable bowel syndrome 01/04/2020   Spinal stenosis of lumbar region 01/03/2020   Other symptoms and signs involving the musculoskeletal system 09/11/2019   Bilateral leg weakness 08/01/2019   Leg swelling 08/01/2019   Diabetes mellitus with neuropathy ([place]) 06/15/2019  Hypokalemia 11/08/2018   CKD (chronic kidney disease) stage 3, GFR 30-59 ml/min (HCC) 11/08/2018   Orthostatic hypotension 07/28/2017   SVD (spontaneous vaginal delivery)    Peripheral neuropathy    On home oxygen therapy    Migraines    Left bundle branch block    Hypothyroidism    Hypertension    Hyperlipidemia    Heart  murmur    GERD (gastroesophageal reflux disease)    Exertional shortness of breath    Major depressive disorder    Back pain    Arthritis    Generalized anxiety disorder    Anemia    Chronic respiratory failure ([place]) 09/14/2013   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   Morbid obesity ([place]) 52/84/1324   Chronic systolic heart failure ([place]) 10/27/2012   Endometrial polyp 01/20/2012   Malignant tumor of breast ([place]) 03/26/2011   Unspecified vitamin D deficiency 03/26/2011    Current Outpatient Medications:    acetaminophen (TYLENOL) 500 MG tablet, Take 1,000 mg by mouth every 6 (six) hours as needed for moderate pain., Disp: , Rfl:    acetaZOLAMIDE (DIAMOX) 125 MG tablet, Take 1 tablet (125 mg total) by mouth daily., Disp: 90 tablet, Rfl: 3   carvedilol (COREG) 3.125 MG tablet, TAKE 1 TABLET (3.125 MG) BY MOUTH TWICE DAILY WITH MEALS (Patient taking differently: Take 3.125 mg by mouth 2 (two) times daily with a meal.), Disp: 180 tablet, Rfl: 3   citalopram (CELEXA) 40 MG tablet, Take 40 mg by mouth daily., Disp: , Rfl:    clonazePAM (KLONOPIN) 0.5 MG tablet, Take 0.5 mg by mouth daily as needed for anxiety., Disp: , Rfl:    dicyclomine (BENTYL) 20 MG tablet, Take 20 mg by mouth 3 (three) times daily before meals., Disp: , Rfl:    fludrocortisone (FLORINEF) 0.1 MG tablet, Take 1 tablet (100 mcg total) by mouth 2 (two) times daily. (Patient not taking: Reported on 08/19/2021), Disp: 180 tablet, Rfl: 3   fluticasone (FLONASE) 50 MCG/ACT nasal spray, Place 1 spray into both nostrils daily as needed for allergies or rhinitis., Disp: , Rfl:    gabapentin (NEURONTIN) 300 MG capsule, Take 1 capsule (300 mg total) by mouth 2 (two) times daily., Disp: , Rfl:    gabapentin (NEURONTIN) 800 MG tablet, TAKE 1 TABLET AT BEDTIME AS NEEDED FOR NEUROPATHY (Patient not taking: Reported on 10/08/2021), Disp: 30 tablet, Rfl: 1   levothyroxine (SYNTHROID) 88 MCG tablet, TAKE  1 TABLET(88 MCG) BY MOUTH DAILY BEFORE BREAKFAST (Patient taking differently: Take 88 mcg by mouth daily before breakfast.), Disp: 90 tablet, Rfl: 3   loperamide (IMODIUM A-D) 2 MG tablet, Take 2 mg by mouth 4 (four) times daily as needed for diarrhea or loose stools., Disp: , Rfl:    metolazone (ZAROXOLYN) 2.5 MG tablet, Take 1 tablet by mouth every other Tuesday. (Patient taking differently: Take 2.5 mg by mouth See admin instructions. Take 1 tablet by mouth every other Tuesday.), Disp: 12 tablet, Rfl: 3   midodrine (PROAMATINE) 10 MG tablet, TAKE 1 TABLET THREE TIMES DAILY WITH MEALS (Patient taking differently: Take 10 mg by mouth 3 (three) times daily.), Disp: 270 tablet, Rfl: 3   pantoprazole (PROTONIX) 40 MG tablet, Take 1 tablet (40 mg total) by mouth daily., Disp: 30 tablet, Rfl: 3   potassium chloride SA (KLOR-CON M) 20 MEQ tablet, Take 3 tablets (60 mEq total) by mouth 3 (three) times daily., Disp: 572 tablet, Rfl: 0   Pseudoeph-Doxylamine-DM-APAP (DAYQUIL/NYQUIL COLD/FLU RELIEF PO),  Take 15 mLs by mouth at bedtime as needed (sleep)., Disp: , Rfl:    Simethicone (GAS-X MAXIMUM STRENGTH PO), Take 1 tablet by mouth daily as needed (flatulance)., Disp: , Rfl:    simvastatin (ZOCOR) 10 MG tablet, Take 1 tablet (10 mg total) by mouth every evening., Disp: 30 tablet, Rfl: 11   spironolactone (ALDACTONE) 25 MG tablet, Take 1 tablet (25 mg total) by mouth every evening. Needs appt for further refills, Disp: 90 tablet, Rfl: 0   torsemide (DEMADEX) 20 MG tablet, Take 4 tablets (80 mg total) by mouth every morning AND 3 tablets (60 mg total) every evening., Disp: 210 tablet, Rfl: 11 Allergies  Allergen Reactions   Ceftin Anaphylaxis    Face and throat swell    Geodon [Ziprasidone Hcl] Hives   Lisinopril Other (See Comments)    angioedema   Shellfish Allergy Other (See Comments)    Gout exacerbation   Shellfish-Derived Products     Other reaction(s): Other   Cefuroxime     Other reaction(s):  anaphylaxis   Sulfacetamide Sodium-Sulfur     Other reaction(s): itch   Ziprasidone     Other reaction(s): hives on left arm   Allopurinol Nausea Only and Other (See Comments)    weakness   Ativan [Lorazepam] Itching   Sulfa Antibiotics Itching   Ultram [Tramadol Hcl] Itching   Ultram [Tramadol] Itching   Valium [Diazepam] Other (See Comments)    Patient states that diazepam doesn't relax, it has the opposite effect.     Social History   Socioeconomic History   Marital status: Married    Spouse name: Not on file   Number of children: 2   Years of education: 43   Highest education level: Master's degree (e.g., MA, MS, MEng, MEd, MSW, MBA)  Occupational History   Occupation: retired  Tobacco Use   Smoking status: Former    Packs/day: 0.10    Years: 26.00    Pack years: 2.60    Types: Cigarettes    Quit date: 03/23/2021    Years since quitting: 0.5   Smokeless tobacco: Never  Vaping Use   Vaping Use: Never used  Substance and Sexual Activity   Alcohol use: No   Drug use: No   Sexual activity: Yes  Other Topics Concern   Not on file  Social History Narrative   Tobacco Use Cigarettes: Former Smoker, Quit in 2008   No Alcohol   No recreational drug use   Diet: Regular/Low Carb   Exercise: None   Occupation: disabled   Education: Research officer, political party, masters   Children: 2   Firearms: No   Therapist, art Use: 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      Social Determinants of Health   Financial Resource Strain: Not on file  Food Insecurity: Not on file  Transportation Needs: Not on file  Physical Activity: Not on file  Stress: Not on file  Social Connections: Not on file  Intimate Partner Violence: Not on file    Physical Exam      Future Appointments  Date Time Provider [place]  10/21/2021  7:30 AM [facility] DEVICE REMOTES [facility]  10/23/2021  3:15 PM Wallene Huh, DPM [facility]  11/05/2021  9:15  AM Marzetta Board, DPM [facility]  12/02/2021 10:00 AM [name], Tiffany L, DO [facility] None  12/10/2021  7:15 AM [facility] DEVICE REMOTES [facility]  12/25/2021  2:30 PM  [facility] PA/NP [facility] None  03/11/2022  7:15 AM [facility] DEVICE REMOTES [facility]  04/01/2022  1:00 PM Hazle Coca, PhD [facility] None  04/01/2022  2:00 PM L[facility]- NEUROPSYCH TECH [facility] None  04/08/2022  2:30 PM Hazle Coca, PhD [facility] None  04/15/2022  8:50 AM Narda Amber K, DO [facility] None  06/10/2022  7:15 AM [facility] DEVICE REMOTES [facility]  09/09/2022  7:15 AM [facility] DEVICE REMOTES [facility]  12/09/2022  7:15 AM [facility] DEVICE REMOTES [facility]     ACTION: Home visit completed

## 2021-10-23 ENCOUNTER — Telehealth: Payer: Self-pay

## 2021-10-23 ENCOUNTER — Telehealth (HOSPITAL_COMMUNITY): Payer: Self-pay | Admitting: Cardiology

## 2021-10-23 ENCOUNTER — Ambulatory Visit: Payer: Medicare HMO | Admitting: Podiatry

## 2021-10-23 DIAGNOSIS — I5022 Chronic systolic (congestive) heart failure: Secondary | ICD-10-CM

## 2021-10-23 NOTE — Telephone Encounter (Signed)
Abnormal labs received from pcp Labs drawn 10/15/21 Cr  1.57 BUN 25 K 4.7  Per [name] NP -repeat labs in 10/14 days    Pt aware via [place]

## 2021-10-23 NOTE — Telephone Encounter (Signed)
LMOVM for patient to send missed ICM transmission. 

## 2021-10-24 NOTE — Progress Notes (Signed)
No ICM remote transmission received for 10/21/2021 and next ICM transmission scheduled for 11/03/2021.

## 2021-10-27 ENCOUNTER — Other Ambulatory Visit (HOSPITAL_COMMUNITY): Payer: Self-pay

## 2021-10-27 NOTE — Progress Notes (Signed)
Paramedicine Encounter    Patient ID: Rhonda [name], female    DOB: December 03, 1950, 71 y.o.   MRN: [id number]  Arrived for home visit for Rhonda [name] who reports feeling okay today stating that she feels better than last week. Today I came out for medication review and pill box refill.   I reviewed all meds and confirmed same. I filled pill box for one week. Refills as noted below:  -Levothyroxine -Acetazolamide -Duloxetine -Pantoprazole   We discussed upcoming appointments. I wrote same down for her. I also confirmed she needed to send in an ICD transmission. She reports she did so yesterday. I will follow up and make sure she does so on 6/12.   Home visit complete. I will follow up in one week.        Patient Care Team: [name], MD as PCP - General (Family Medicine) [name], LCSW as Social Worker (Licensed Clinical Social Worker) [name], [name], DO as Consulting Physician (Neurology) [name], EMT as Paramedic [name], MD as Attending Physician (Family Medicine)  Patient Active Problem List   Diagnosis Date Noted   Acute left ankle pain 10/11/2021   Intractable nausea and vomiting 10/08/2021   AKI (acute kidney injury) ([place]) 10/08/2021   Hyponatremia 10/08/2021   Decreased estrogen level 08/01/2021   Dementia ([place]) 08/01/2021   Frail elderly 08/01/2021   Hyperparathyroidism ([place]) 08/01/2021   Spondylolisthesis 08/01/2021   Varicose veins of bilateral lower extremities with other complications 95/28/4132   Lumbago without sciatica 04/30/2021   Abnormal gait 06/11/2020   Adult failure to thrive syndrome 06/11/2020   Allergic rhinitis 06/11/2020   Anxiety 06/11/2020   Asthma 06/11/2020   Benign intracranial hypertension 06/11/2020   Body mass index (BMI) 45.0-49.9, adult ([place]) 06/11/2020   Bowel incontinence 06/11/2020   Cholelithiasis 06/11/2020   Chronic sinusitis 06/11/2020   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   Daytime somnolence  06/11/2020   Diarrhea of presumed infectious origin 06/11/2020   Edema 06/11/2020   Family history of malignant neoplasm of gastrointestinal tract 06/11/2020   Gout 06/11/2020   History of fall 06/11/2020   History of infectious disease 06/11/2020   Insomnia 06/11/2020   Irregular bowel habits 06/11/2020   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   Lumbar spondylosis with myelopathy 06/11/2020   Macrocytosis 06/11/2020   Mild recurrent major depression ([place]) 06/11/2020   Personal history of colonic polyps 06/11/2020   Personal history of malignant neoplasm of breast 06/11/2020   Repeated falls 06/11/2020   Mixed anxiety and depressive disorder 06/11/2020   Irritable bowel syndrome 01/04/2020   Spinal stenosis of lumbar region 01/03/2020   Other symptoms and signs involving the musculoskeletal system 09/11/2019   Bilateral leg weakness 08/01/2019   Leg swelling 08/01/2019   Diabetes mellitus with neuropathy ([place]) 06/15/2019   Hypokalemia 11/08/2018   CKD (chronic kidney disease) stage 3, GFR 30-59 ml/min (HCC) 11/08/2018   Orthostatic hypotension 07/28/2017   SVD (spontaneous vaginal delivery)    Peripheral neuropathy    On home oxygen therapy    Migraines    Left bundle branch block    Hypothyroidism    Hypertension    Hyperlipidemia    Heart murmur    GERD (gastroesophageal reflux disease)    Exertional shortness of breath    Major depressive disorder    Back pain    Arthritis    Generalized anxiety disorder    Anemia    Chronic respiratory failure ([place]) 09/14/2013   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/04/2013   Morbid obesity ([place]) 33/29/5188   Chronic systolic heart failure ([place]) 10/27/2012   Endometrial polyp 01/20/2012   Malignant tumor of breast ([place]) 03/26/2011   Unspecified vitamin D deficiency 03/26/2011    Current Outpatient Medications:    acetaminophen (TYLENOL) 500 MG tablet, Take 1,000 mg by mouth every 6 (six) hours as needed  for moderate pain., Disp: , Rfl:    acetaZOLAMIDE (DIAMOX) 125 MG tablet, Take 1 tablet (125 mg total) by mouth daily., Disp: 90 tablet, Rfl: 3   carvedilol (COREG) 3.125 MG tablet, TAKE 1 TABLET (3.125 MG) BY MOUTH TWICE DAILY WITH MEALS (Patient taking differently: Take 3.125 mg by mouth 2 (two) times daily with a meal.), Disp: 180 tablet, Rfl: 3   citalopram (CELEXA) 40 MG tablet, Take 40 mg by mouth daily., Disp: , Rfl:    clonazePAM (KLONOPIN) 0.5 MG tablet, Take 0.5 mg by mouth daily as needed for anxiety., Disp: , Rfl:    dicyclomine (BENTYL) 20 MG tablet, Take 20 mg by mouth 3 (three) times daily before meals., Disp: , Rfl:    DULoxetine (CYMBALTA) 20 MG capsule, Take 20 mg by mouth daily., Disp: , Rfl:    fludrocortisone (FLORINEF) 0.1 MG tablet, Take 1 tablet (100 mcg total) by mouth 2 (two) times daily. (Patient not taking: Reported on 08/19/2021), Disp: 180 tablet, Rfl: 3   fluticasone (FLONASE) 50 MCG/ACT nasal spray, Place 1 spray into both nostrils daily as needed for allergies or rhinitis., Disp: , Rfl:    gabapentin (NEURONTIN) 300 MG capsule, Take 1 capsule (300 mg total) by mouth 2 (two) times daily., Disp: , Rfl:    gabapentin (NEURONTIN) 800 MG tablet, TAKE 1 TABLET AT BEDTIME AS NEEDED FOR NEUROPATHY (Patient not taking: Reported on 10/08/2021), Disp: 30 tablet, Rfl: 1   levothyroxine (SYNTHROID) 88 MCG tablet, TAKE 1 TABLET(88 MCG) BY MOUTH DAILY BEFORE BREAKFAST (Patient taking differently: Take 88 mcg by mouth daily before breakfast.), Disp: 90 tablet, Rfl: 3   loperamide (IMODIUM A-D) 2 MG tablet, Take 2 mg by mouth 4 (four) times daily as needed for diarrhea or loose stools. (Patient not taking: Reported on 10/20/2021), Disp: , Rfl:    metolazone (ZAROXOLYN) 2.5 MG tablet, Take 1 tablet by mouth every other Tuesday. (Patient taking differently: Take 2.5 mg by mouth See admin instructions. Take 1 tablet by mouth every other Tuesday.), Disp: 12 tablet, Rfl: 3   midodrine  (PROAMATINE) 10 MG tablet, TAKE 1 TABLET THREE TIMES DAILY WITH MEALS (Patient taking differently: Take 10 mg by mouth 3 (three) times daily.), Disp: 270 tablet, Rfl: 3   pantoprazole (PROTONIX) 40 MG tablet, Take 1 tablet (40 mg total) by mouth daily., Disp: 30 tablet, Rfl: 3   potassium chloride SA (KLOR-CON M) 20 MEQ tablet, Take 3 tablets (60 mEq total) by mouth 3 (three) times daily., Disp: 572 tablet, Rfl: 0   Pseudoeph-Doxylamine-DM-APAP (DAYQUIL/NYQUIL COLD/FLU RELIEF PO), Take 15 mLs by mouth at bedtime as needed (sleep). (Patient not taking: Reported on 10/20/2021), Disp: , Rfl:    Simethicone (GAS-X MAXIMUM STRENGTH PO), Take 1 tablet by mouth daily as needed (flatulance). (Patient not taking: Reported on 10/20/2021), Disp: , Rfl:    simvastatin (ZOCOR) 10 MG tablet, Take 1 tablet (10 mg total) by mouth every evening., Disp: 30 tablet, Rfl: 11   spironolactone (ALDACTONE) 25 MG tablet, Take 1 tablet (25 mg total) by mouth every evening. Needs appt for further refills, Disp: 90 tablet, Rfl: 0  torsemide (DEMADEX) 20 MG tablet, Take 4 tablets (80 mg total) by mouth every morning AND 3 tablets (60 mg total) every evening., Disp: 210 tablet, Rfl: 11 Allergies  Allergen Reactions   Ceftin Anaphylaxis    Face and throat swell    Geodon [Ziprasidone Hcl] Hives   Lisinopril Other (See Comments)    angioedema   Shellfish Allergy Other (See Comments)    Gout exacerbation   Shellfish-Derived Products     Other reaction(s): Other   Cefuroxime     Other reaction(s): anaphylaxis   Sulfacetamide Sodium-Sulfur     Other reaction(s): itch   Ziprasidone     Other reaction(s): hives on left arm   Allopurinol Nausea Only and Other (See Comments)    weakness   Ativan [Lorazepam] Itching   Sulfa Antibiotics Itching   Ultram [Tramadol Hcl] Itching   Ultram [Tramadol] Itching   Valium [Diazepam] Other (See Comments)    Patient states that diazepam doesn't relax, it has the opposite effect.      Social History   Socioeconomic History   Marital status: Married    Spouse name: Not on file   Number of children: 2   Years of education: 44   Highest education level: Master's degree (e.g., MA, MS, MEng, MEd, MSW, MBA)  Occupational History   Occupation: retired  Tobacco Use   Smoking status: Former    Packs/day: 0.10    Years: 26.00    Pack years: 2.60    Types: Cigarettes    Quit date: 03/23/2021    Years since quitting: 0.5   Smokeless tobacco: Never  Vaping Use   Vaping Use: Never used  Substance and Sexual Activity   Alcohol use: No   Drug use: No   Sexual activity: Yes  Other Topics Concern   Not on file  Social History Narrative   Tobacco Use Cigarettes: Former Smoker, Quit in 2008   No Alcohol   No recreational drug use   Diet: Regular/Low Carb   Exercise: None   Occupation: disabled   Education: Research officer, political party, masters   Children: 2   Firearms: No   Therapist, art Use: 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      Social Determinants of Health   Financial Resource Strain: Not on file  Food Insecurity: Not on file  Transportation Needs: Not on file  Physical Activity: Not on file  Stress: Not on file  Social Connections: Not on file  Intimate Partner Violence: Not on file    Physical Exam      Future Appointments  Date Time Provider [place]  11/03/2021  7:35 AM [facility] DEVICE REMOTES [facility]  11/06/2021 11:30 AM [facility] LAB [facility] None  11/19/2021  2:15 PM Marzetta Board, DPM [facility]  12/02/2021 10:00 AM Reed, Tiffany L, DO [facility] None  12/10/2021  7:15 AM [facility] DEVICE REMOTES [facility]  12/25/2021  2:30 PM [facility] PA/NP [facility] None  03/11/2022  7:15 AM [facility] DEVICE REMOTES [facility]  04/01/2022  1:00 PM Hazle Coca, PhD [facility] None  04/01/2022  2:00 PM LBN- NEUROPSYCH TECH [facility] None  04/08/2022  2:30 PM Hazle Coca, PhD  [facility] None  04/15/2022  8:50 AM Narda Amber K, DO [facility] None  06/10/2022  7:15 AM [facility] DEVICE REMOTES [facility]  09/09/2022  7:15 AM [facility] DEVICE REMOTES [facility]  12/09/2022  7:15 AM [facility] DEVICE REMOTES [facility]  ACTION: Home visit completed

## 2021-10-30 ENCOUNTER — Other Ambulatory Visit (HOSPITAL_COMMUNITY): Payer: Self-pay | Admitting: Internal Medicine

## 2021-11-03 ENCOUNTER — Other Ambulatory Visit (HOSPITAL_COMMUNITY): Payer: Self-pay

## 2021-11-03 ENCOUNTER — Ambulatory Visit (INDEPENDENT_AMBULATORY_CARE_PROVIDER_SITE_OTHER): Payer: Medicare HMO

## 2021-11-03 DIAGNOSIS — I5022 Chronic systolic (congestive) heart failure: Secondary | ICD-10-CM

## 2021-11-03 DIAGNOSIS — Z95810 Presence of automatic (implantable) cardiac defibrillator: Secondary | ICD-10-CM

## 2021-11-03 NOTE — Progress Notes (Signed)
Paramedicine Encounter    Patient ID: Rhonda [name], female    DOB: 1950/10/06, 71 y.o.   MRN: [id number]   Arrived for home visit for Rhonda [name] who reports feeling tired today. She stated lately she has been so depressed she has not had much energy to do anything and has little pleasure in any activities. She states that she just woke up at 1:30 and has had no morning medications today. I stressed the importance of her having a routine where she gets up on time and eat and take her morning medications. She reports she will try to do better.   I obtained assessment and vitals. All within normal range, no edema noted. Weight down this week. Lungs clear on assessment.   I reviewed medications and filled pill box accordingly also giving her, her morning medications.   We also reviewed upcoming appointments and confirmed same. During our visit her social worker from [facility] called to set up home visit on Friday at 1300. I wrote all appointments down on every calendar in her room.   Refills to be delivered by [facility]: -Acetazolamide -Levothyroxine  -Pantoprazole     RN- Enid Derry [phone]  Notified to send in Pantoprazole refills to [facility], she confirmed same.    I plan to see Rhonda [name] in one week. Home visit complete.   [name], EMT-Paramedic [phone] 11/03/2021       Patient Care Team: [name], MD as PCP - General (Family Medicine) [name], LCSW as Social Worker (Licensed Clinical Social Worker) [name], [name], DO as Consulting Physician (Neurology) [name], EMT as Paramedic [name] [name], MD as Attending Physician (Family Medicine)  Patient Active Problem List   Diagnosis Date Noted   Acute left ankle pain 10/11/2021   Intractable nausea and vomiting 10/08/2021   AKI (acute kidney injury) ([place]) 10/08/2021   Hyponatremia 10/08/2021   Decreased estrogen level 08/01/2021   Dementia ([place]) 08/01/2021   Frail elderly 08/01/2021    Hyperparathyroidism ([place]) 08/01/2021   Spondylolisthesis 08/01/2021   Varicose veins of bilateral lower extremities with other complications 96/78/9381   Lumbago without sciatica 04/30/2021   Abnormal gait 06/11/2020   Adult failure to thrive syndrome 06/11/2020   Allergic rhinitis 06/11/2020   Anxiety 06/11/2020   Asthma 06/11/2020   Benign intracranial hypertension 06/11/2020   Body mass index (BMI) 45.0-49.9, adult ([place]) 06/11/2020   Bowel incontinence 06/11/2020   Cholelithiasis 06/11/2020   Chronic sinusitis 06/11/2020   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   Daytime somnolence 06/11/2020   Diarrhea of presumed infectious origin 06/11/2020   Edema 06/11/2020   Family history of malignant neoplasm of gastrointestinal tract 06/11/2020   Gout 06/11/2020   History of fall 06/11/2020   History of infectious disease 06/11/2020   Insomnia 06/11/2020   Irregular bowel habits 06/11/2020   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   Lumbar spondylosis with myelopathy 06/11/2020   Macrocytosis 06/11/2020   Mild recurrent major depression ([place]) 06/11/2020   Personal history of colonic polyps 06/11/2020   Personal history of malignant neoplasm of breast 06/11/2020   Repeated falls 06/11/2020   Mixed anxiety and depressive disorder 06/11/2020   Irritable bowel syndrome 01/04/2020   Spinal stenosis of lumbar region 01/03/2020   Other symptoms and signs involving the musculoskeletal system 09/11/2019   Bilateral leg weakness 08/01/2019   Leg swelling 08/01/2019   Diabetes mellitus with neuropathy ([place]) 06/15/2019   Hypokalemia 11/08/2018   CKD (chronic kidney disease) stage 3, GFR 30-59 ml/min ([place]) 11/08/2018  Orthostatic hypotension 07/28/2017   SVD (spontaneous vaginal delivery)    Peripheral neuropathy    On home oxygen therapy    Migraines    Left bundle branch block    Hypothyroidism    Hypertension    Hyperlipidemia    Heart murmur    GERD (gastroesophageal reflux disease)    Exertional  shortness of breath    Major depressive disorder    Back pain    Arthritis    Generalized anxiety disorder    Anemia    Chronic respiratory failure ([place]) 09/14/2013   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   Morbid obesity ([place]) 16/02/9603   Chronic systolic heart failure ([place]) 10/27/2012   Endometrial polyp 01/20/2012   Malignant tumor of breast ([place]) 03/26/2011   Unspecified vitamin D deficiency 03/26/2011    Current Outpatient Medications:    acetaminophen (TYLENOL) 500 MG tablet, Take 1,000 mg by mouth every 6 (six) hours as needed for moderate pain., Disp: , Rfl:    acetaZOLAMIDE (DIAMOX) 125 MG tablet, Take 1 tablet (125 mg total) by mouth daily., Disp: 90 tablet, Rfl: 3   carvedilol (COREG) 3.125 MG tablet, TAKE 1 TABLET (3.125 MG) BY MOUTH TWICE DAILY WITH MEALS (Patient taking differently: Take 3.125 mg by mouth 2 (two) times daily with a meal.), Disp: 180 tablet, Rfl: 3   citalopram (CELEXA) 40 MG tablet, Take 40 mg by mouth daily., Disp: , Rfl:    clonazePAM (KLONOPIN) 0.5 MG tablet, Take 0.5 mg by mouth daily as needed for anxiety., Disp: , Rfl:    dicyclomine (BENTYL) 20 MG tablet, Take 20 mg by mouth 3 (three) times daily before meals., Disp: , Rfl:    DULoxetine (CYMBALTA) 20 MG capsule, Take 20 mg by mouth daily., Disp: , Rfl:    fludrocortisone (FLORINEF) 0.1 MG tablet, Take 1 tablet (100 mcg total) by mouth 2 (two) times daily. (Patient not taking: Reported on 08/19/2021), Disp: 180 tablet, Rfl: 3   fluticasone (FLONASE) 50 MCG/ACT nasal spray, Place 1 spray into both nostrils daily as needed for allergies or rhinitis., Disp: , Rfl:    gabapentin (NEURONTIN) 300 MG capsule, Take 1 capsule (300 mg total) by mouth 2 (two) times daily., Disp: , Rfl:    gabapentin (NEURONTIN) 800 MG tablet, TAKE 1 TABLET AT BEDTIME AS NEEDED FOR NEUROPATHY (Patient not taking: Reported on 10/08/2021), Disp: 30 tablet, Rfl: 1   levothyroxine (SYNTHROID) 88  MCG tablet, TAKE 1 TABLET(88 MCG) BY MOUTH DAILY BEFORE BREAKFAST (Patient taking differently: Take 88 mcg by mouth daily before breakfast.), Disp: 90 tablet, Rfl: 3   loperamide (IMODIUM A-D) 2 MG tablet, Take 2 mg by mouth 4 (four) times daily as needed for diarrhea or loose stools. (Patient not taking: Reported on 10/20/2021), Disp: , Rfl:    metolazone (ZAROXOLYN) 2.5 MG tablet, Take 1 tablet by mouth every other Tuesday. (Patient taking differently: Take 2.5 mg by mouth See admin instructions. Take 1 tablet by mouth every other Tuesday.), Disp: 12 tablet, Rfl: 3   midodrine (PROAMATINE) 10 MG tablet, TAKE 1 TABLET THREE TIMES DAILY WITH MEALS (Patient taking differently: Take 10 mg by mouth 3 (three) times daily.), Disp: 270 tablet, Rfl: 3   pantoprazole (PROTONIX) 40 MG tablet, Take 1 tablet (40 mg total) by mouth daily., Disp: 30 tablet, Rfl: 3   potassium chloride SA (KLOR-CON M) 20 MEQ tablet, Take 3 tablets (60 mEq total) by mouth 3 (three) times daily., Disp: 572 tablet, Rfl: 0  Pseudoeph-Doxylamine-DM-APAP (DAYQUIL/NYQUIL COLD/FLU RELIEF PO), Take 15 mLs by mouth at bedtime as needed (sleep). (Patient not taking: Reported on 10/20/2021), Disp: , Rfl:    Simethicone (GAS-X MAXIMUM STRENGTH PO), Take 1 tablet by mouth daily as needed (flatulance). (Patient not taking: Reported on 10/20/2021), Disp: , Rfl:    simvastatin (ZOCOR) 10 MG tablet, Take 1 tablet (10 mg total) by mouth every evening., Disp: 30 tablet, Rfl: 11   spironolactone (ALDACTONE) 25 MG tablet, Take 1 tablet (25 mg total) by mouth every evening. Needs appt for further refills, Disp: 90 tablet, Rfl: 0   torsemide (DEMADEX) 20 MG tablet, Take 4 tablets (80 mg total) by mouth every morning AND 3 tablets (60 mg total) every evening., Disp: 210 tablet, Rfl: 11 Allergies  Allergen Reactions   Ceftin Anaphylaxis    Face and throat swell    Geodon [Ziprasidone Hcl] Hives   Lisinopril Other (See Comments)    angioedema   Shellfish  Allergy Other (See Comments)    Gout exacerbation   Shellfish-Derived Products     Other reaction(s): Other   Cefuroxime     Other reaction(s): anaphylaxis   Sulfacetamide Sodium-Sulfur     Other reaction(s): itch   Ziprasidone     Other reaction(s): hives on left arm   Allopurinol Nausea Only and Other (See Comments)    weakness   Ativan [Lorazepam] Itching   Sulfa Antibiotics Itching   Ultram [Tramadol Hcl] Itching   Ultram [Tramadol] Itching   Valium [Diazepam] Other (See Comments)    Patient states that diazepam doesn't relax, it has the opposite effect.     Social History   Socioeconomic History   Marital status: Married    Spouse name: Not on file   Number of children: 2   Years of education: 43   Highest education level: Master's degree (e.g., MA, MS, MEng, MEd, MSW, MBA)  Occupational History   Occupation: retired  Tobacco Use   Smoking status: Former    Packs/day: 0.10    Years: 26.00    Total pack years: 2.60    Types: Cigarettes    Quit date: 03/23/2021    Years since quitting: 0.6   Smokeless tobacco: Never  Vaping Use   Vaping Use: Never used  Substance and Sexual Activity   Alcohol use: No   Drug use: No   Sexual activity: Yes  Other Topics Concern   Not on file  Social History Narrative   Tobacco Use Cigarettes: Former Smoker, Quit in 2008   No Alcohol   No recreational drug use   Diet: Regular/Low Carb   Exercise: None   Occupation: disabled   Education: Research officer, political party, masters   Children: 2   Firearms: No   Therapist, art Use: 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      Social Determinants of Health   Financial Resource Strain: Not on file  Food Insecurity: Not on file  Transportation Needs: Not on file  Physical Activity: Not on file  Stress: Not on file  Social Connections: Not on file  Intimate Partner Violence: Not on file    Physical Exam      Future Appointments  Date Time Provider [place]  11/06/2021 11:30 AM [facility] LAB [facility] None  11/19/2021  2:15 PM Marzetta Board, DPM [facility]  12/02/2021 10:00 AM Reed, Tiffany L, DO [facility] None  12/10/2021  7:15 AM [facility] DEVICE REMOTES [facility]  12/25/2021  2:30 PM [facility] PA/NP [facility] None  03/11/2022  7:15 AM [facility] DEVICE REMOTES [facility]  04/01/2022  1:00 PM Hazle Coca, PhD [facility] None  04/01/2022  2:00 PM LBN- NEUROPSYCH TECH [facility] None  04/08/2022  2:30 PM Hazle Coca, PhD [facility] None  04/15/2022  8:50 AM Narda Amber K, DO [facility] None  06/10/2022  7:15 AM [facility] DEVICE REMOTES [facility]  09/09/2022  7:15 AM [facility] DEVICE REMOTES [facility]  12/09/2022  7:15 AM [facility] DEVICE REMOTES [facility]     ACTION: Home visit completed

## 2021-11-03 NOTE — Progress Notes (Signed)
EPIC Encounter for ICM Monitoring  Patient Name: Rhonda [name] is a 71 y.o. female Date: 11/03/2021 Primary Care Physican: [name], MD Primary Cardiologist: Bensimhon Electrophysiologist: Santina Evans Pacing: 97.4%   07/30/2021  Weight: 230 lbs          Transmission reviewed.   Optivol thoracic impedance suggesting fluid levels returned to normal.    Prescribed:  Patient participating in paramedicine program to assist with meds. Torsemide 20 mg 4 tablets (80 mg total) by mouth every morning and 3 tablets (60 mg total) every evening.   Potassium 20 meq 2 tablets (40 mEq total) by mouth 3 (three) times daily.  Take extra 2 tablets on days you take Metolazone. Metolazone 2.'5mg'$  1 tablet every other Tuesday. Spironolactone 25 mg take 1 tablet by mouth every evening.   Labs: 10/10/2021 Creatinine 1.11, BUN 18, Potassium 3.7, Sodium 140, GFR 54 10/09/2021 Creatinine 1.47, BUN 33, Potassium 3.8, Sodium 136, GFR 39  10/08/2021 Creatinine 2.05, BUN 36, Potassium 4.3, Sodium 133, GFR 26  10/06/2021 Creatinine 2.04, BUN 42, Potassium 5.0, Sodium 134, GFR 26  10/05/2021 Creatinine 1.78, BUN 34, Potassium 3.6, Sodium 134, GFR 31  09/04/2021 Creatinine 1.66, BUN 41, Potassium 3.7, Sodium 135, GFR 33 A complete set of results can be found in Results Review.   Recommendations:  No changes.   Follow-up plan: ICM clinic phone appointment on 11/17/2021.   91 day device clinic remote transmission 12/10/2021.     EP/Cardiology Office Visits:   12/25/2021 with [facility].  Recall 07/26/2021 with Dr Lovena Le.     Copy of ICM check sent to Dr. Lovena Le.   3 month ICM trend: 11/03/2021.    12-14 Month ICM trend:     [name], RN 11/03/2021 5:08 PM

## 2021-11-05 ENCOUNTER — Ambulatory Visit: Payer: Medicare HMO | Admitting: Podiatry

## 2021-11-06 ENCOUNTER — Other Ambulatory Visit (HOSPITAL_COMMUNITY): Payer: Medicare HMO

## 2021-11-10 ENCOUNTER — Other Ambulatory Visit (HOSPITAL_COMMUNITY): Payer: Self-pay

## 2021-11-10 NOTE — Progress Notes (Signed)
Paramedicine Encounter    Patient ID: Rhonda [name], female    DOB: 02/01/1951, 71 y.o.   MRN: [id number]   Arrived for home visit for Rhonda [name] who reports feeling okay today just tired. She says she has been sleeping a lot lately and has no energy. I advised her it would be best for her to be sure she is getting up daily and taking her medications as ordered because she is getting up late in the day and missing two times of dosing of her medications. She verbalizes understanding and says she will try to do better. I asked her if she felt if she needed to speak to a therapist or counselor and she says not right now. I will continue to check in during our weekly visits.   She reports her and her husband plan to have a trip together this week for her birthday. She is looking forward to traveling.   Vitals and assessment obtained. She appears to be short of breath on exertion today but has not had much exercise over the last few months. I encouraged her to get up and walk every day even if it is just inside her home. She agreed.   I gave her her morning medications during our visit and re-educated her on times of taking her medications according to her pill box. She understood.   I reviewed upcoming appointments and confirmed same.    I reviewed all meds and confirmed same filling pill box for one week. She has not been taking her metolazone due to recent hospitalization and being euvolemic but we took one today with extra potassium and next dose will be on 7/4.   Lung sounds clear today, no lower leg edema noted.  BP- 130/80 HR- 73 O2- 99% RR- 16 (at rest) 24 (moving)  We discussed diet and overall health management today and she verbalized understanding. I will be out in the home next week for follow up. She agreed.   Home visit complete.   NO REFILLS NEEDED  [name], [place] 11/10/2021    Patient Care Team: [name], MD as PCP - General  (Family Medicine) [name], LCSW as Social Worker (Licensed Clinical Social Worker) [name], [name], DO as Consulting Physician (Neurology) [name], EMT as Paramedic [name] [name], MD as Attending Physician (Family Medicine)  Patient Active Problem List   Diagnosis Date Noted   Acute left ankle pain 10/11/2021   Intractable nausea and vomiting 10/08/2021   AKI (acute kidney injury) ([place]) 10/08/2021   Hyponatremia 10/08/2021   Decreased estrogen level 08/01/2021   Dementia ([place]) 08/01/2021   [place]rail elderly 08/01/2021   Hyperparathyroidism ([place]) 08/01/2021   Spondylolisthesis 08/01/2021   Varicose veins of bilateral lower extremities with other complications 01/74/9449   Lumbago without sciatica 04/30/2021   Abnormal gait 06/11/2020   Adult failure to thrive syndrome 06/11/2020   Allergic rhinitis 06/11/2020   Anxiety 06/11/2020   Asthma 06/11/2020   Benign intracranial hypertension 06/11/2020   Body mass index (BMI) 45.0-49.9, adult ([place]) 06/11/2020   Bowel incontinence 06/11/2020   Cholelithiasis 06/11/2020   Chronic sinusitis 06/11/2020   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   Daytime somnolence 06/11/2020   Diarrhea of presumed infectious origin 06/11/2020   Edema 06/11/2020   Family history of malignant neoplasm of gastrointestinal tract 06/11/2020   Gout 06/11/2020   History of fall 06/11/2020   History of infectious disease 06/11/2020   Insomnia 06/11/2020   Irregular bowel habits 06/11/2020  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   Lumbar spondylosis with myelopathy 06/11/2020   Macrocytosis 06/11/2020   Mild recurrent major depression ([place]) 06/11/2020   Personal history of colonic polyps 06/11/2020   Personal history of malignant neoplasm of breast 06/11/2020   Repeated falls 06/11/2020   Mixed anxiety and depressive disorder 06/11/2020   Irritable bowel syndrome 01/04/2020   Spinal stenosis of lumbar region 01/03/2020   Other symptoms and signs involving the  musculoskeletal system 09/11/2019   Bilateral leg weakness 08/01/2019   Leg swelling 08/01/2019   Diabetes mellitus with neuropathy ([place]) 06/15/2019   Hypokalemia 11/08/2018   CKD (chronic kidney disease) stage 3, GFR 30-59 ml/min ([facility]) 11/08/2018   Orthostatic hypotension 07/28/2017   SVD (spontaneous vaginal delivery)    Peripheral neuropathy    On home oxygen therapy    Migraines    Left bundle branch block    Hypothyroidism    Hypertension    Hyperlipidemia    Heart murmur    GERD (gastroesophageal reflux disease)    Exertional shortness of breath    Major depressive disorder    Back pain    Arthritis    Generalized anxiety disorder    Anemia    Chronic respiratory failure ([place]) 09/14/2013   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   Morbid obesity ([place]) 64/33/2951   Chronic systolic heart failure ([place]) 10/27/2012   Endometrial polyp 01/20/2012   Malignant tumor of breast ([place]) 03/26/2011   Unspecified vitamin D deficiency 03/26/2011    Current Outpatient Medications:    acetaminophen (TYLENOL) 500 MG tablet, Take 1,000 mg by mouth every 6 (six) hours as needed for moderate pain., Disp: , Rfl:    acetaZOLAMIDE (DIAMOX) 125 MG tablet, Take 1 tablet (125 mg total) by mouth daily., Disp: 90 tablet, Rfl: 3   carvedilol (COREG) 3.125 MG tablet, TAKE 1 TABLET (3.125 MG) BY MOUTH TWICE DAILY WITH MEALS (Patient taking differently: Take 3.125 mg by mouth 2 (two) times daily with a meal.), Disp: 180 tablet, Rfl: 3   citalopram (CELEXA) 40 MG tablet, Take 40 mg by mouth daily., Disp: , Rfl:    clonazePAM (KLONOPIN) 0.5 MG tablet, Take 0.5 mg by mouth daily as needed for anxiety., Disp: , Rfl:    dicyclomine (BENTYL) 20 MG tablet, Take 20 mg by mouth 3 (three) times daily before meals., Disp: , Rfl:    DULoxetine (CYMBALTA) 20 MG capsule, Take 20 mg by mouth daily., Disp: , Rfl:    fludrocortisone (FLORINEF) 0.1 MG tablet, Take 1 tablet (100 mcg  total) by mouth 2 (two) times daily. (Patient not taking: Reported on 08/19/2021), Disp: 180 tablet, Rfl: 3   fluticasone (FLONASE) 50 MCG/ACT nasal spray, Place 1 spray into both nostrils daily as needed for allergies or rhinitis., Disp: , Rfl:    gabapentin (NEURONTIN) 300 MG capsule, Take 1 capsule (300 mg total) by mouth 2 (two) times daily., Disp: , Rfl:    gabapentin (NEURONTIN) 800 MG tablet, TAKE 1 TABLET AT BEDTIME AS NEEDED FOR NEUROPATHY (Patient not taking: Reported on 10/08/2021), Disp: 30 tablet, Rfl: 1   levothyroxine (SYNTHROID) 88 MCG tablet, TAKE 1 TABLET(88 MCG) BY MOUTH DAILY BEFORE BREAKFAST (Patient taking differently: Take 88 mcg by mouth daily before breakfast.), Disp: 90 tablet, Rfl: 3   loperamide (IMODIUM A-D) 2 MG tablet, Take 2 mg by mouth 4 (four) times daily as needed for diarrhea or loose stools. (Patient not taking: Reported on 10/20/2021), Disp: , Rfl:    metolazone (ZAROXOLYN)  2.5 MG tablet, Take 1 tablet by mouth every other Tuesday. (Patient taking differently: Take 2.5 mg by mouth See admin instructions. Take 1 tablet by mouth every other Tuesday.), Disp: 12 tablet, Rfl: 3   midodrine (PROAMATINE) 10 MG tablet, TAKE 1 TABLET THREE TIMES DAILY WITH MEALS (Patient taking differently: Take 10 mg by mouth 3 (three) times daily.), Disp: 270 tablet, Rfl: 3   pantoprazole (PROTONIX) 40 MG tablet, Take 1 tablet (40 mg total) by mouth daily., Disp: 30 tablet, Rfl: 3   potassium chloride SA (KLOR-CON M) 20 MEQ tablet, Take 3 tablets (60 mEq total) by mouth 3 (three) times daily., Disp: 572 tablet, Rfl: 0   Pseudoeph-Doxylamine-DM-APAP (DAYQUIL/NYQUIL COLD/FLU RELIEF PO), Take 15 mLs by mouth at bedtime as needed (sleep). (Patient not taking: Reported on 10/20/2021), Disp: , Rfl:    Simethicone (GAS-X MAXIMUM STRENGTH PO), Take 1 tablet by mouth daily as needed (flatulance). (Patient not taking: Reported on 10/20/2021), Disp: , Rfl:    simvastatin (ZOCOR) 10 MG tablet, Take 1 tablet  (10 mg total) by mouth every evening., Disp: 30 tablet, Rfl: 11   spironolactone (ALDACTONE) 25 MG tablet, Take 1 tablet (25 mg total) by mouth every evening. Needs appt for further refills, Disp: 90 tablet, Rfl: 0   torsemide (DEMADEX) 20 MG tablet, Take 4 tablets (80 mg total) by mouth every morning AND 3 tablets (60 mg total) every evening., Disp: 210 tablet, Rfl: 11 Allergies  Allergen Reactions   Ceftin Anaphylaxis    Face and throat swell    Geodon [Ziprasidone Hcl] Hives   Lisinopril Other (See Comments)    angioedema   Shellfish Allergy Other (See Comments)    Gout exacerbation   Shellfish-Derived Products     Other reaction(s): Other   Cefuroxime     Other reaction(s): anaphylaxis   Sulfacetamide Sodium-Sulfur     Other reaction(s): itch   Ziprasidone     Other reaction(s): hives on left arm   Allopurinol Nausea Only and Other (See Comments)    weakness   Ativan [Lorazepam] Itching   Sulfa Antibiotics Itching   Ultram [Tramadol Hcl] Itching   Ultram [Tramadol] Itching   Valium [Diazepam] Other (See Comments)    Patient states that diazepam doesn't relax, it has the opposite effect.     Social History   Socioeconomic History   Marital status: Married    Spouse name: Not on file   Number of children: 2   Years of education: 44   Highest education level: Master's degree (e.g., MA, MS, MEng, MEd, MSW, MBA)  Occupational History   Occupation: retired  Tobacco Use   Smoking status: Former    Packs/day: 0.10    Years: 26.00    Total pack years: 2.60    Types: Cigarettes    Quit date: 03/23/2021    Years since quitting: 0.6   Smokeless tobacco: Never  Vaping Use   Vaping Use: Never used  Substance and Sexual Activity   Alcohol use: No   Drug use: No   Sexual activity: Yes  Other Topics Concern   Not on file  Social History Narrative   Tobacco Use Cigarettes: Former Smoker, Quit in 2008   No Alcohol   No recreational drug use   Diet: Regular/Low Carb    Exercise: None   Occupation: disabled   Education: Research officer, political party, masters   Children: 2   Firearms: No   Therapist, art Use: Always   Former Metallurgist.  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      Social Determinants of Health   Financial Resource Strain: Not on file  Food Insecurity: Not on file  Transportation Needs: Not on file  Physical Activity: Not on file  Stress: Not on file  Social Connections: Not on file  Intimate Partner Violence: Not on file    Physical Exam      Future Appointments  Date Time Provider [place]  11/11/2021  2:30 PM [facility] None  11/17/2021  7:15 AM [facility] DEVICE REMOTES [facility]  11/19/2021  2:15 PM Marzetta Board, DPM [facility]  12/02/2021 10:00 AM Reed, Tiffany L, DO [facility] None  12/10/2021  7:15 AM [facility] DEVICE REMOTES [facility]  12/25/2021  2:30 PM [facility] PA/NP [facility] None  03/11/2022  7:15 AM [facility] DEVICE REMOTES [facility]  04/01/2022  1:00 PM Hazle Coca, PhD [facility] None  04/01/2022  2:00 PM LBN- NEUROPSYCH TECH [facility] None  04/08/2022  2:30 PM Hazle Coca, PhD [facility] None  04/15/2022  8:50 AM Narda Amber K, DO [facility] None  06/10/2022  7:15 AM [facility] DEVICE REMOTES [facility]  09/09/2022  7:15 AM [facility] DEVICE REMOTES [facility]  12/09/2022  7:15 AM [facility] DEVICE REMOTES [facility]     ACTION: Home visit completed

## 2021-11-11 ENCOUNTER — Other Ambulatory Visit (HOSPITAL_COMMUNITY): Payer: Medicare HMO

## 2021-11-12 ENCOUNTER — Telehealth (HOSPITAL_COMMUNITY): Payer: Self-pay

## 2021-11-12 ENCOUNTER — Ambulatory Visit (HOSPITAL_COMMUNITY)
Admission: RE | Admit: 2021-11-12 | Discharge: 2021-11-12 | Disposition: A | Discharge status: Home / Self Care | Payer: Medicare HMO | Source: Ambulatory Visit | Attending: Internal Medicine | Admitting: Internal Medicine

## 2021-11-12 DIAGNOSIS — I5022 Chronic systolic (congestive) heart failure: Secondary | ICD-10-CM | POA: Insufficient documentation

## 2021-11-12 LAB — BASIC METABOLIC PANEL
Anion gap: 16 — ABNORMAL HIGH (ref 5–15)
BUN: 31 mg/dL — ABNORMAL HIGH (ref 8–23)
CO2: 22 mmol/L (ref 22–32)
Calcium: 9.7 mg/dL (ref 8.9–10.3)
Chloride: 94 mmol/L — ABNORMAL LOW (ref 98–111)
Creatinine, Ser: 1.9 mg/dL — ABNORMAL HIGH (ref 0.44–1.00)
GFR, Estimated: 28 mL/min — ABNORMAL LOW (ref >60)
Glucose, Bld: 193 mg/dL — ABNORMAL HIGH (ref 70–99)
Potassium: 3.3 mmol/L — ABNORMAL LOW (ref 3.5–5.1)
Sodium: 132 mmol/L — ABNORMAL LOW (ref 135–145)

## 2021-11-12 NOTE — Telephone Encounter (Signed)
Called Rhonda [name] and Ed to review labs and med changes. I walked Ed through on adding extra potassium to her pill box with no issues.  I will follow up in the home on Monday. Call complete.   [name], [place] 11/12/2021

## 2021-11-13 ENCOUNTER — Telehealth (HOSPITAL_COMMUNITY): Payer: Self-pay

## 2021-11-13 DIAGNOSIS — I5022 Chronic systolic (congestive) heart failure: Secondary | ICD-10-CM

## 2021-11-13 NOTE — Telephone Encounter (Addendum)
Pt aware, agreeable, and verbalized understanding  Labs scheduled for next week   ----- Message from Rafael Bihari, FNP sent at 11/12/2021  4:16 PM EDT ----- K is low. Has she been taking 60 KCL three times a day + extra 40 on metolazone days?   If so, please increase KCL to 80 tid, repeat BMET and Mag in 1 week.

## 2021-11-13 NOTE — Addendum Note (Signed)
Addended by: Jerl Mina on: 11/13/2021 04:23 PM   Modules accepted: Orders

## 2021-11-17 ENCOUNTER — Ambulatory Visit (INDEPENDENT_AMBULATORY_CARE_PROVIDER_SITE_OTHER): Payer: Medicare HMO

## 2021-11-17 ENCOUNTER — Other Ambulatory Visit (HOSPITAL_COMMUNITY): Payer: Self-pay

## 2021-11-17 DIAGNOSIS — I5022 Chronic systolic (congestive) heart failure: Secondary | ICD-10-CM | POA: Diagnosis not present

## 2021-11-17 DIAGNOSIS — Z95810 Presence of automatic (implantable) cardiac defibrillator: Secondary | ICD-10-CM | POA: Diagnosis not present

## 2021-11-17 NOTE — Progress Notes (Addendum)
Paramedicine Encounter    Patient ID: Rhonda [name], female    DOB: 05-10-1951, 71 y.o.   MRN: [id number]  She reports that she hasnt been feeling well since last week. She has felt weak and light headed.  She has not been taking her meds.  She also reports feeling depressed, no increased sob, no c/p.  She began to cry, she reports not going on her birthday trip. She states she doesn't want to live here anymore, she is alone all day and gets lonely with her husband gone working all day til late.  She said her youngest daughter gets in the middle of her and husband and fusses and all and that stresses her out too.  The oldest daughter is busy a lot and doesn't reach out to her much.   She has missed 6AM doses of her meds, missed 1 PM dose of meds this past week.  Her husband had placed the extra potassium in the pill boxes.  She hasnt taking any of her before breakfast meds.   Filled up 2 pill boxes for her--the 2nd pill box she is missing the clonazepam   Refills needed: Clonazepam -need this week-needs in 2nd pill box Duloxetine Midodrine-missing in 2nd pill box  Potassium-missing in fri/sat PM dose and missing in 2nd pill box. Will need to get clinic to send over new rx to [facility].   --the epic list of potassium does not match her lab result note to increase the dose--    BP 120/82   Pulse 92   Resp 18   SpO2 98%  Weight yesterday-? Last visit weight-200  Patient Care Team: Shon Hale, MD as PCP - General (Family Medicine) Burna Sis, LCSW as Social Worker (Licensed Clinical Social Worker) [name], Noberto Retort, DO as Consulting Physician (Neurology) [name], EMT as Paramedic Chanetta Marshall Meridee Score, MD as Attending Physician (Family Medicine)  Patient Active Problem List   Diagnosis Date Noted   Acute left ankle pain 10/11/2021   Intractable nausea and vomiting 10/08/2021   AKI (acute kidney injury) (HCC) 10/08/2021   Hyponatremia 10/08/2021   Decreased  estrogen level 08/01/2021   Dementia (HCC) 08/01/2021   Frail elderly 08/01/2021   Hyperparathyroidism (HCC) 08/01/2021   Spondylolisthesis 08/01/2021   Varicose veins of bilateral lower extremities with other complications 08/01/2021   Lumbago without sciatica 04/30/2021   Abnormal gait 06/11/2020   Adult failure to thrive syndrome 06/11/2020   Allergic rhinitis 06/11/2020   Anxiety 06/11/2020   Asthma 06/11/2020   Benign intracranial hypertension 06/11/2020   Body mass index (BMI) 45.0-49.9, adult (HCC) 06/11/2020   Bowel incontinence 06/11/2020   Cholelithiasis 06/11/2020   Chronic sinusitis 06/11/2020   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   Daytime somnolence 06/11/2020   Diarrhea of presumed infectious origin 06/11/2020   Edema 06/11/2020   Family history of malignant neoplasm of gastrointestinal tract 06/11/2020   Gout 06/11/2020   History of fall 06/11/2020   History of infectious disease 06/11/2020   Insomnia 06/11/2020   Irregular bowel habits 06/11/2020   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   Lumbar spondylosis with myelopathy 06/11/2020   Macrocytosis 06/11/2020   Mild recurrent major depression (HCC) 06/11/2020   Personal history of colonic polyps 06/11/2020   Personal history of malignant neoplasm of breast 06/11/2020   Repeated falls 06/11/2020   Mixed anxiety and depressive disorder 06/11/2020   Irritable bowel syndrome 01/04/2020   Spinal stenosis of lumbar region 01/03/2020   Other symptoms and signs involving the musculoskeletal  system 09/11/2019   Bilateral leg weakness 08/01/2019   Leg swelling 08/01/2019   Diabetes mellitus with neuropathy (HCC) 06/15/2019   Hypokalemia 11/08/2018   CKD (chronic kidney disease) stage 3, GFR 30-59 ml/min (HCC) 11/08/2018   Orthostatic hypotension 07/28/2017   SVD (spontaneous vaginal delivery)    Peripheral neuropathy    On home oxygen therapy    Migraines    Left bundle branch block    Hypothyroidism    Hypertension     Hyperlipidemia    Heart murmur    GERD (gastroesophageal reflux disease)    Exertional shortness of breath    Major depressive disorder    Back pain    Arthritis    Generalized anxiety disorder    Anemia    Chronic respiratory failure (HCC) 09/14/2013   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   Morbid obesity (HCC) 11/01/2012   Chronic systolic heart failure (HCC) 10/27/2012   Endometrial polyp 01/20/2012   Malignant tumor of breast (HCC) 03/26/2011   Unspecified vitamin D deficiency 03/26/2011    Current Outpatient Medications:    acetaminophen (TYLENOL) 500 MG tablet, Take 1,000 mg by mouth every 6 (six) hours as needed for moderate pain., Disp: , Rfl:    acetaZOLAMIDE (DIAMOX) 125 MG tablet, Take 1 tablet (125 mg total) by mouth daily., Disp: 90 tablet, Rfl: 3   carvedilol (COREG) 3.125 MG tablet, TAKE 1 TABLET (3.125 MG) BY MOUTH TWICE DAILY WITH MEALS, Disp: 180 tablet, Rfl: 3   citalopram (CELEXA) 40 MG tablet, Take 40 mg by mouth daily., Disp: , Rfl:    clonazePAM (KLONOPIN) 0.5 MG tablet, Take 0.5 mg by mouth daily as needed for anxiety., Disp: , Rfl:    dicyclomine (BENTYL) 20 MG tablet, Take 20 mg by mouth 3 (three) times daily before meals., Disp: , Rfl:    DULoxetine (CYMBALTA) 20 MG capsule, Take 20 mg by mouth daily., Disp: , Rfl:    fluticasone (FLONASE) 50 MCG/ACT nasal spray, Place 1 spray into both nostrils daily as needed for allergies or rhinitis., Disp: , Rfl:    gabapentin (NEURONTIN) 300 MG capsule, Take 1 capsule (300 mg total) by mouth 2 (two) times daily., Disp: , Rfl:    levothyroxine (SYNTHROID) 88 MCG tablet, TAKE 1 TABLET(88 MCG) BY MOUTH DAILY BEFORE BREAKFAST, Disp: 90 tablet, Rfl: 3   metolazone (ZAROXOLYN) 2.5 MG tablet, Take 1 tablet by mouth every other Tuesday. (Patient taking differently: Take 1 tablet by mouth every other Tuesday.), Disp: 12 tablet, Rfl: 3   midodrine (PROAMATINE) 10 MG tablet, TAKE 1 TABLET  THREE TIMES DAILY WITH MEALS, Disp: 270 tablet, Rfl: 3   pantoprazole (PROTONIX) 40 MG tablet, Take 1 tablet (40 mg total) by mouth daily., Disp: 30 tablet, Rfl: 3   simvastatin (ZOCOR) 10 MG tablet, Take 1 tablet (10 mg total) by mouth every evening., Disp: 30 tablet, Rfl: 11   spironolactone (ALDACTONE) 25 MG tablet, Take 1 tablet (25 mg total) by mouth every evening. Needs appt for further refills, Disp: 90 tablet, Rfl: 0   torsemide (DEMADEX) 20 MG tablet, Take 4 tablets (80 mg total) by mouth every morning AND 3 tablets (60 mg total) every evening., Disp: 210 tablet, Rfl: 11   fludrocortisone (FLORINEF) 0.1 MG tablet, Take 1 tablet (100 mcg total) by mouth 2 (two) times daily. (Patient not taking: Reported on 08/19/2021), Disp: 180 tablet, Rfl: 3   gabapentin (NEURONTIN) 800 MG tablet, TAKE 1 TABLET AT BEDTIME AS NEEDED FOR NEUROPATHY (  Patient not taking: Reported on 10/08/2021), Disp: 30 tablet, Rfl: 1   loperamide (IMODIUM A-D) 2 MG tablet, Take 2 mg by mouth 4 (four) times daily as needed for diarrhea or loose stools. (Patient not taking: Reported on 10/20/2021), Disp: , Rfl:    potassium chloride SA (KLOR-CON M) 20 MEQ tablet, Take 3 tablets (60 mEq total) by mouth 3 (three) times daily., Disp: 572 tablet, Rfl: 0   Pseudoeph-Doxylamine-DM-APAP (DAYQUIL/NYQUIL COLD/FLU RELIEF PO), Take 15 mLs by mouth at bedtime as needed (sleep). (Patient not taking: Reported on 10/20/2021), Disp: , Rfl:    Simethicone (GAS-X MAXIMUM STRENGTH PO), Take 1 tablet by mouth daily as needed (flatulance). (Patient not taking: Reported on 10/20/2021), Disp: , Rfl:  Allergies  Allergen Reactions   Ceftin Anaphylaxis    Face and throat swell    Geodon [Ziprasidone Hcl] Hives   Lisinopril Other (See Comments)    angioedema   Shellfish Allergy Other (See Comments)    Gout exacerbation   Shellfish-Derived Products     Other reaction(s): Other   Cefuroxime     Other reaction(s): anaphylaxis   Sulfacetamide  Sodium-Sulfur     Other reaction(s): itch   Ziprasidone     Other reaction(s): hives on left arm   Allopurinol Nausea Only and Other (See Comments)    weakness   Ativan [Lorazepam] Itching   Sulfa Antibiotics Itching   Ultram [Tramadol Hcl] Itching   Ultram [Tramadol] Itching   Valium [Diazepam] Other (See Comments)    Patient states that diazepam doesn't relax, it has the opposite effect.      Social History   Socioeconomic History   Marital status: Married    Spouse name: Not on file   Number of children: 2   Years of education: 71   Highest education level: Master's degree (e.g., MA, MS, MEng, MEd, MSW, MBA)  Occupational History   Occupation: retired  Tobacco Use   Smoking status: Former    Packs/day: 0.10    Years: 26.00    Total pack years: 2.60    Types: Cigarettes    Quit date: 03/23/2021    Years since quitting: 0.6   Smokeless tobacco: Never  Vaping Use   Vaping Use: Never used  Substance and Sexual Activity   Alcohol use: No   Drug use: No   Sexual activity: Yes  Other Topics Concern   Not on file  Social History Narrative   Tobacco Use Cigarettes: Former Smoker, Quit in 2008   No Alcohol   No recreational drug use   Diet: Regular/Low Carb   Exercise: None   Occupation: disabled   Education: Company secretary, masters   Children: 2   Firearms: No   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      Social Determinants of Health   Financial Resource Strain: Not on file  Food Insecurity: Not on file  Transportation Needs: Not on file  Physical Activity: Not on file  Stress: Not on file  Social Connections: Not on file  Intimate Partner Violence: Not on file    Physical Exam      Future Appointments  Date Time Provider Department Center  11/19/2021  1:45 PM [facility] LAB [facility] None  11/19/2021  2:15 PM Freddie Breech, DPM [facility]  12/02/2021 10:00 AM Reed, Tiffany L, DO [facility] None   12/10/2021  7:15 AM [facility] DEVICE REMOTES [facility]  12/25/2021  2:30 PM [facility] PA/NP [facility] None  03/11/2022  7:15 AM [facility] DEVICE REMOTES [facility]  04/01/2022  1:00 PM Rosann Auerbach, PhD [facility] None  04/01/2022  2:00 PM [facility]SY[facility] TECH [facility] None  04/08/2022  2:30 PM Rosann Auerbach, PhD [facility] None  04/15/2022  8:50 AM Nita Sickle K, DO [facility] None  06/10/2022  7:15 AM [facility] DEVICE REMOTES [facility]  09/09/2022  7:15 AM [facility] DEVICE REMOTES [facility]  12/09/2022  7:15 AM [facility] DEVICE REMOTES [facility]       Kerry Hough, EMT-Paramedic [phone] [facility] Paramedic  11/17/21

## 2021-11-19 ENCOUNTER — Encounter: Payer: Self-pay | Admitting: Podiatry

## 2021-11-19 ENCOUNTER — Other Ambulatory Visit (HOSPITAL_COMMUNITY): Payer: Medicare HMO

## 2021-11-19 ENCOUNTER — Ambulatory Visit: Payer: Medicare HMO | Admitting: Podiatry

## 2021-11-19 DIAGNOSIS — M79675 Pain in left toe(s): Secondary | ICD-10-CM | POA: Diagnosis not present

## 2021-11-19 DIAGNOSIS — M79674 Pain in right toe(s): Secondary | ICD-10-CM

## 2021-11-19 DIAGNOSIS — E1142 Type 2 diabetes mellitus with diabetic polyneuropathy: Secondary | ICD-10-CM | POA: Diagnosis not present

## 2021-11-19 DIAGNOSIS — B351 Tinea unguium: Secondary | ICD-10-CM

## 2021-11-19 NOTE — Progress Notes (Signed)
EPIC Encounter for ICM Monitoring  Patient Name: Rhonda [name] is a 71 y.o. female Date: 11/19/2021 Primary Care Physican: [name], MD Primary Cardiologist: Anthony Electrophysiologist: Santina Evans Pacing: 97.1%   07/30/2021  Weight: 230 lbs          Spoke with patient and heart failure questions reviewed.  Pt asymptomatic for fluid accumulation.  Reports feeling well at this time and voices no complaints.    Optivol thoracic impedance suggesting normal fluid levels.    Prescribed:  Patient participating in paramedicine program to assist with meds. Torsemide 20 mg 4 tablets (80 mg total) by mouth every morning and 3 tablets (60 mg total) every evening.  The epic list of potassium does not match her 6/22 lab result note to increase the dose--  Potassium 20 meq 3 tablets (60 mEq total) by mouth 3 (three) times daily.  Take extra 2 tablets on days you take Metolazone.   Metolazone 2.'5mg'$  1 tablet every other Tuesday. Spironolactone 25 mg take 1 tablet by mouth every evening.   Labs: 11/12/2021 Creatinine 1.90, BUN 31, Potassium 3.3, Sodium 132, GFR 28 10/10/2021 Creatinine 1.11, BUN 18, Potassium 3.7, Sodium 140, GFR 54 10/09/2021 Creatinine 1.47, BUN 33, Potassium 3.8, Sodium 136, GFR 39  10/08/2021 Creatinine 2.05, BUN 36, Potassium 4.3, Sodium 133, GFR 26  10/06/2021 Creatinine 2.04, BUN 42, Potassium 5.0, Sodium 134, GFR 26  10/05/2021 Creatinine 1.78, BUN 34, Potassium 3.6, Sodium 134, GFR 31  09/04/2021 Creatinine 1.66, BUN 41, Potassium 3.7, Sodium 135, GFR 33 A complete set of results can be found in Results Review.   Recommendations:  No changes and encouraged to call if experiencing any fluid symptoms.   Follow-up plan: ICM clinic phone appointment on 12/22/2021.   91 day device clinic remote transmission 12/10/2021.     EP/Cardiology Office Visits:   12/25/2021 with [facility] Clinic.  Recall 07/26/2021 with Dr Lovena Le.     Copy of ICM check sent to Dr. Lovena Le.  3 month  ICM trend: 11/17/2021.    12-14 Month ICM trend:     [name], RN 11/19/2021 2:00 PM

## 2021-11-20 ENCOUNTER — Other Ambulatory Visit (HOSPITAL_COMMUNITY): Payer: Self-pay

## 2021-11-20 ENCOUNTER — Telehealth (HOSPITAL_COMMUNITY): Payer: Self-pay | Admitting: Licensed Clinical Social Worker

## 2021-11-20 NOTE — Progress Notes (Signed)
  Heart and Vascular Care Navigation  11/20/2021  ZONA PEDRO [birth year] [id number]  Reason for Referral: mental health concerns   Engaged with patient by telephone for follow up visit for Heart and Vascular Care Coordination.                                                                                                   Assessment:     CSW consulted to assist pt in connecting with mental health provider as she expressed feelings of depression while with healthcare provider yesterday.  CSW has attempted to assist with this in the past referring patient to Elias Else with [facility] but patient did not follow up when they contacted her regarding referral.  CSW and community paramedic has also provided pt with list of local mental health providers and encouraged to go to walk in hours with [facility].  Pt still agreeable to mental health referrals so CSW sent referral back to Elias Else and discussed with Clinical biochemist.  Paramedic will complete home visit today and will help patient connect with [facility] staff regarding an appointment                                 HRT/VAS [place]; Social Worker   Community Paramedic Name: Jeris Penta- [phone]   Social Worker Name: Tammy Sours- [facility]- [phone]   Living arrangements for the past 2 months [place] with: Spouse   Does Patient Have Prescription Coverage? Yes   Home Assistive Devices/Equipment Cane (specify quad or straight)       Social History:                                                                             SDOH Screenings   Alcohol Screen: Not on file  Depression (ZDG3-8): Not on file  Financial Resource Strain: Not on file  Food Insecurity: Not on file  Housing: Not on file  Physical Activity: Not on file  Social Connections: Not on file  Stress: Not on file  Tobacco Use: Medium Risk (11/19/2021)    Patient History    Smoking Tobacco Use: Former    Smokeless Tobacco Use: Never    Passive Exposure: Not on file  Transportation Needs: Not on file    Follow-up plan:     CSW will continue to follow and help connect pt to mental health resources  [name], [place] [facility] Desk#: [phone] Cell#: [phone]

## 2021-11-20 NOTE — Progress Notes (Signed)
Subjective:  Patient ID: Rhonda [name], female    DOB: 02/24/1951,  MRN: [id number]  Rhonda [name] presents to clinic today for preventative diabetic foot care and painful thick toenails that are difficult to trim. Pain interferes with ambulation. Aggravating factors include wearing enclosed shoe gear. Pain is relieved with periodic professional debridement.  Patient presents to clinic on today's visit. When asked how is she doing, she states, "Not good". She is tearful.  Her husband states she has been depressed lately.   Patient continues to cry and holds her head down. She looks at me and motions her eyes towards her husband. I did ask her husband to leave the room so I could speak with her alone. She states she is very lonely and her husband works a lot. She has two daughters. One daughter is upset with her and the other daughter is busy as a Theme park manager with two kids. [name] has had no thoughts of harming herself or others. She is not being abused. She states she would like to live on her own in a senior apartment community. She states she was given a list of therapists, but has not been able to get an appointment with one.  PCP is Rhonda Smoker, MD , and last visit was November 03, 2021.  Allergies  Allergen Reactions   Ceftin Anaphylaxis    Face and throat swell    Geodon [Ziprasidone Hcl] Hives   Lisinopril Other (See Comments)    angioedema   Shellfish Allergy Other (See Comments)    Gout exacerbation   Shellfish-Derived Products     Other reaction(s): Other   Cefuroxime     Other reaction(s): anaphylaxis   Sulfacetamide Sodium-Sulfur     Other reaction(s): itch   Ziprasidone     Other reaction(s): hives on left arm   Allopurinol Nausea Only and Other (See Comments)    weakness   Ativan [Lorazepam] Itching   Sulfa Antibiotics Itching   Ultram [Tramadol Hcl] Itching   Ultram [Tramadol] Itching   Valium [Diazepam] Other (See Comments)    Patient states that diazepam  doesn't relax, it has the opposite effect.    Review of Systems: Negative except as noted in the HPI.  Objective: No changes noted in today's physical examination.  Constitutional Pt is a pleasant 71 y.o. African American female in NAD. AAO x 3.   Vascular Capillary refill time to digits immediate b/l. Faintly palpable pedal pulses b/l. Pedal hair absent. Lower extremity skin temperature gradient within normal limits.  Neurologic Protective sensation diminished with 10g monofilament b/l. Vibratory sensation diminished b/l. Clonus negative b/l.  Dermatologic Pedal skin with normal turgor, texture and tone bilaterally. No open wounds bilaterally. No interdigital macerations bilaterally. Toenails 1-5 b/l elongated, discolored, dystrophic, thickened, crumbly with subungual debris and tenderness to dorsal palpation. No hyperkeratotic nor porokeratotic lesions present on today's visit. Pedal skin noted to be dry and flaky b/l lower extremities.  Orthopedic: Normal muscle strength 5/5 to all lower extremity muscle groups bilaterally. Decreased ankle joint ROM b/l. Pes planus deformity noted b/l.    Radiographs: None    Latest Ref Rng & Units 10/09/2021   10:49 AM 10/08/2021    5:58 PM  Hemoglobin A1C  Hemoglobin-A1c 4.8 - 5.6 % 7.0  6.9    Assessment/Plan: 1. Pain due to onychomycosis of toenails of both feet   2. Diabetic peripheral neuropathy associated with type 2 diabetes mellitus ([place])     -Examined patient. -Spoke to patient  alone and with husband present. Advised them I would communicate events of today's visit with care team to assist with getting Rhonda [name] some help. Patient and husband related understanding. -Toenails 1-5 b/l were debrided in length and girth with sterile nail nippers and dremel without iatrogenic bleeding.  -Patient/POA to call should there be question/concern in the interim.   Return in about 3 months (around 02/19/2022).  Marzetta Board, DPM

## 2021-11-20 NOTE — Progress Notes (Signed)
Paramedicine Encounter    Patient ID: Rhonda [name], female    DOB: [birth year], 71 y.o.   MRN: [id number]  Arrived for med rec for [place] today.   I reviewed meds and placed missing pills into her second week pill box per Rhonda [name] [facility] who saw her on Monday.   Refills: Clonazepam (called into [facility])  She will place missing Clonazepam in pill box for next week- written instructions left for her and her husband to do so.   Rhonda [name] and I talked about her mental health today and she agreed to meet with clinical psychiatrist Dr. Michail Sermon with [facility]. She has a visit planned for Aug 2.   After our talk in the home today she reports feeling better and says "her clouded thinking is better". I wrote out appointments on calendar and reminders for same.   We discussed med compliance as she has skipped several doses due to her increased depression and anxiety over the last few weeks. She has done better this week so far and has had no missed doses since Monday. I encouraged her to continue to the good work. She seemed hopeful following our visit. I plan to see her in two weeks on 7/10. She agreed with same. Visit complete.   Rhonda [name], [place] 11/20/2021    ACTION: Home visit completed

## 2021-11-28 ENCOUNTER — Telehealth: Payer: Self-pay | Admitting: *Deleted

## 2021-11-28 NOTE — Telephone Encounter (Signed)
Received a communication that patient's husband called and is requesting to speak with someone regarding his wife. I returned his call and left a voicemail. Left my contact information for return call.

## 2021-12-01 ENCOUNTER — Emergency Department (HOSPITAL_COMMUNITY): Payer: Medicare HMO

## 2021-12-01 ENCOUNTER — Encounter (HOSPITAL_COMMUNITY): Payer: Self-pay | Admitting: Emergency Medicine

## 2021-12-01 ENCOUNTER — Other Ambulatory Visit (HOSPITAL_COMMUNITY): Payer: Medicare HMO

## 2021-12-01 ENCOUNTER — Other Ambulatory Visit (HOSPITAL_COMMUNITY): Payer: Self-pay

## 2021-12-01 ENCOUNTER — Telehealth: Payer: Self-pay | Admitting: Internal Medicine

## 2021-12-01 ENCOUNTER — Other Ambulatory Visit: Payer: Self-pay

## 2021-12-01 ENCOUNTER — Observation Stay (HOSPITAL_COMMUNITY)
Admission: EM | Admit: 2021-12-01 | Discharge: 2021-12-02 | Disposition: A | Discharge status: Home / Self Care | Payer: Medicare HMO | Attending: Internal Medicine | Admitting: Internal Medicine

## 2021-12-01 DIAGNOSIS — R9431 Abnormal electrocardiogram [ECG] [EKG]: Secondary | ICD-10-CM | POA: Insufficient documentation

## 2021-12-01 DIAGNOSIS — I159 Secondary hypertension, unspecified: Secondary | ICD-10-CM

## 2021-12-01 DIAGNOSIS — J45909 Unspecified asthma, uncomplicated: Secondary | ICD-10-CM | POA: Insufficient documentation

## 2021-12-01 DIAGNOSIS — F332 Major depressive disorder, recurrent severe without psychotic features: Secondary | ICD-10-CM

## 2021-12-01 DIAGNOSIS — E1140 Type 2 diabetes mellitus with diabetic neuropathy, unspecified: Secondary | ICD-10-CM | POA: Diagnosis not present

## 2021-12-01 DIAGNOSIS — R778 Other specified abnormalities of plasma proteins: Secondary | ICD-10-CM | POA: Insufficient documentation

## 2021-12-01 DIAGNOSIS — R410 Disorientation, unspecified: Secondary | ICD-10-CM | POA: Diagnosis not present

## 2021-12-01 DIAGNOSIS — N183 Chronic kidney disease, stage 3 unspecified: Secondary | ICD-10-CM | POA: Diagnosis present

## 2021-12-01 DIAGNOSIS — F419 Anxiety disorder, unspecified: Secondary | ICD-10-CM | POA: Diagnosis not present

## 2021-12-01 DIAGNOSIS — E876 Hypokalemia: Secondary | ICD-10-CM | POA: Diagnosis not present

## 2021-12-01 DIAGNOSIS — E119 Type 2 diabetes mellitus without complications: Secondary | ICD-10-CM | POA: Diagnosis present

## 2021-12-01 DIAGNOSIS — E1122 Type 2 diabetes mellitus with diabetic chronic kidney disease: Secondary | ICD-10-CM | POA: Diagnosis not present

## 2021-12-01 DIAGNOSIS — F329 Major depressive disorder, single episode, unspecified: Secondary | ICD-10-CM | POA: Diagnosis present

## 2021-12-01 DIAGNOSIS — R112 Nausea with vomiting, unspecified: Principal | ICD-10-CM | POA: Insufficient documentation

## 2021-12-01 DIAGNOSIS — I5042 Chronic combined systolic (congestive) and diastolic (congestive) heart failure: Secondary | ICD-10-CM | POA: Diagnosis not present

## 2021-12-01 DIAGNOSIS — R531 Weakness: Secondary | ICD-10-CM | POA: Diagnosis not present

## 2021-12-01 DIAGNOSIS — I951 Orthostatic hypotension: Secondary | ICD-10-CM | POA: Diagnosis not present

## 2021-12-01 DIAGNOSIS — R7989 Other specified abnormal findings of blood chemistry: Secondary | ICD-10-CM | POA: Diagnosis not present

## 2021-12-01 DIAGNOSIS — E039 Hypothyroidism, unspecified: Secondary | ICD-10-CM | POA: Insufficient documentation

## 2021-12-01 DIAGNOSIS — I130 Hypertensive heart and chronic kidney disease with heart failure and stage 1 through stage 4 chronic kidney disease, or unspecified chronic kidney disease: Secondary | ICD-10-CM | POA: Diagnosis not present

## 2021-12-01 DIAGNOSIS — J323 Chronic sphenoidal sinusitis: Secondary | ICD-10-CM | POA: Diagnosis not present

## 2021-12-01 DIAGNOSIS — K8020 Calculus of gallbladder without cholecystitis without obstruction: Secondary | ICD-10-CM | POA: Diagnosis not present

## 2021-12-01 DIAGNOSIS — Z95810 Presence of automatic (implantable) cardiac defibrillator: Secondary | ICD-10-CM | POA: Insufficient documentation

## 2021-12-01 DIAGNOSIS — Z79899 Other long term (current) drug therapy: Secondary | ICD-10-CM | POA: Diagnosis not present

## 2021-12-01 DIAGNOSIS — Z87891 Personal history of nicotine dependence: Secondary | ICD-10-CM | POA: Insufficient documentation

## 2021-12-01 DIAGNOSIS — Z96651 Presence of right artificial knee joint: Secondary | ICD-10-CM | POA: Diagnosis not present

## 2021-12-01 DIAGNOSIS — K449 Diaphragmatic hernia without obstruction or gangrene: Secondary | ICD-10-CM | POA: Diagnosis not present

## 2021-12-01 DIAGNOSIS — I10 Essential (primary) hypertension: Secondary | ICD-10-CM | POA: Diagnosis present

## 2021-12-01 DIAGNOSIS — N1832 Chronic kidney disease, stage 3b: Secondary | ICD-10-CM | POA: Diagnosis not present

## 2021-12-01 DIAGNOSIS — R42 Dizziness and giddiness: Secondary | ICD-10-CM | POA: Diagnosis not present

## 2021-12-01 DIAGNOSIS — R110 Nausea: Secondary | ICD-10-CM | POA: Diagnosis not present

## 2021-12-01 DIAGNOSIS — R519 Headache, unspecified: Secondary | ICD-10-CM | POA: Diagnosis not present

## 2021-12-01 DIAGNOSIS — K8689 Other specified diseases of pancreas: Secondary | ICD-10-CM | POA: Diagnosis not present

## 2021-12-01 LAB — URINALYSIS, ROUTINE W REFLEX MICROSCOPIC
Bilirubin Urine: NEGATIVE
Glucose, UA: NEGATIVE mg/dL
Hgb urine dipstick: NEGATIVE
Ketones, ur: NEGATIVE mg/dL
Nitrite: NEGATIVE
Protein, ur: NEGATIVE mg/dL
Specific Gravity, Urine: 1.01 (ref 1.005–1.030)
pH: 5 (ref 5.0–8.0)

## 2021-12-01 LAB — COMPREHENSIVE METABOLIC PANEL
ALT: 17 U/L (ref 0–44)
AST: 22 U/L (ref 15–41)
Albumin: 4.3 g/dL (ref 3.5–5.0)
Alkaline Phosphatase: 200 U/L — ABNORMAL HIGH (ref 38–126)
Anion gap: 15 (ref 5–15)
BUN: 52 mg/dL — ABNORMAL HIGH (ref 8–23)
CO2: 27 mmol/L (ref 22–32)
Calcium: 9.6 mg/dL (ref 8.9–10.3)
Chloride: 91 mmol/L — ABNORMAL LOW (ref 98–111)
Creatinine, Ser: 1.82 mg/dL — ABNORMAL HIGH (ref 0.44–1.00)
GFR, Estimated: 30 mL/min — ABNORMAL LOW (ref >60)
Glucose, Bld: 173 mg/dL — ABNORMAL HIGH (ref 70–99)
Potassium: 3 mmol/L — ABNORMAL LOW (ref 3.5–5.1)
Sodium: 133 mmol/L — ABNORMAL LOW (ref 135–145)
Total Bilirubin: 1 mg/dL (ref 0.3–1.2)
Total Protein: 9.1 g/dL — ABNORMAL HIGH (ref 6.5–8.1)

## 2021-12-01 LAB — TROPONIN I (HIGH SENSITIVITY)
Troponin I (High Sensitivity): 39 ng/L — ABNORMAL HIGH (ref <18)
Troponin I (High Sensitivity): 41 ng/L — ABNORMAL HIGH (ref <18)

## 2021-12-01 LAB — CBC
HCT: 40.2 % (ref 36.0–46.0)
Hemoglobin: 13.7 g/dL (ref 12.0–15.0)
MCH: 33.2 pg (ref 26.0–34.0)
MCHC: 34.1 g/dL (ref 30.0–36.0)
MCV: 97.3 fL (ref 80.0–100.0)
Platelets: 265 10*3/uL (ref 150–400)
RBC: 4.13 MIL/uL (ref 3.87–5.11)
RDW: 12.5 % (ref 11.5–15.5)
WBC: 9.4 10*3/uL (ref 4.0–10.5)
nRBC: 0 % (ref 0.0–0.2)

## 2021-12-01 LAB — LIPASE, BLOOD: Lipase: 29 U/L (ref 11–51)

## 2021-12-01 MED ORDER — METOCLOPRAMIDE HCL 5 MG/ML IJ SOLN
5.0000 mg | Freq: Once | INTRAMUSCULAR | Status: AC
Start: 2021-12-01 — End: 2021-12-01
  Administered 2021-12-01: 5 mg via INTRAVENOUS
  Filled 2021-12-01: qty 2

## 2021-12-01 MED ORDER — POTASSIUM CHLORIDE CRYS ER 20 MEQ PO TBCR
40.0000 meq | EXTENDED_RELEASE_TABLET | Freq: Once | ORAL | Status: AC
  Administered 2021-12-01: 40 meq via ORAL
  Filled 2021-12-01: qty 2

## 2021-12-01 MED ORDER — LACTATED RINGERS IV BOLUS
1000.0000 mL | Freq: Once | INTRAVENOUS | Status: AC
  Administered 2021-12-01: 1000 mL via INTRAVENOUS

## 2021-12-01 NOTE — ED Triage Notes (Signed)
Upon triaging pt, pt states she has been weaker on her right side than normal. Pt has decreased grip strength on her right side. Pt also reports headache, nausea and confusion x 1 week.

## 2021-12-01 NOTE — Progress Notes (Signed)
Paramedicine Encounter    Patient ID: Rhonda [name], female    DOB: September 19, 1950, 71 y.o.   MRN: [id number]  Arrived for home visit for Rhonda [name] where she was laying in the bed supine alert and oriented reporting she was not feeling well. Her husband reports she has been weak, unable to walk, dizzy with a headache for 3 days. She denied vomiting or diarrhea and stated she has been eating soup twice daily and drinking a few bottles of 546m water a day. She exhibits repetitive questioning but no more than her baseline confusion. No weakness on one side or the other noted, but global weakness present.   I obtained vitals and preformed orthostatics noting a 40 point drop in her systolic BP and a 15 pt increase in her HR upon standing. She exhibited increased dizziness and fatigue on standing. She denied shortness of breath, chest pain or sweating.   Over the last week she has missed two morning doses, 4 noon doses and 3 evening doses of her medications. She was compliant with every day on the week before last's pill box.   LAitannahad no changes in her speech on assessment and denied any visual field changes. I contacted the HF clinic and NP Jessica Milford instructed LSherbyto hold her Spironolactone and Carvedilol and to preform a remote transmission for her to review.   I reviewed med and updated med list- lots of medications she has on list she is no longer taking however, I do not have the ability to remove them so I placed a highlighted note in each med. I filled pill box for one week not including carvedilol and spironolactone. Last metolazone taken 6/27. None placed in box for this week due to hypotension.  I provided her with her morning medications during our visit (minus carvedilol and spiro as instructed)    She was able to take them with food during our visit- she ate some eggs and grits and drank one 508mbottle of water.   She reports nausea but no vomiting during visit. LeTalayehunderstands to call if symptoms worsen and that I will update her once I hear from HF clinic on device transmission results.   Home visit completed.   I left and received a phone call from patients husband who reports she is vomiting and complaining of feeling worse. I called for an EMS unit [facility].   HF clinic updated of her transport to ED and symptom update.   I spoke to AuCasselberrynd updated her on patient status and she advised she would carry out her home visit tomorrow at 10 if patient is discharged but I advised her I would follow up with her on updates as I learned them.   Refills: Clonazepam (none in pill box for this week) Duloxetine (needs AM SAT and SUN)      Patient Care Team: TiGlenis SmokerMD as PCP - General (Family Medicine) UrJorge NyLCSW as Social Worker (Licensed Clinical Social Worker) PaPosey ProntoDoDelena ServeDO as Consulting Physician (Neurology) SpSalena SanerEMT as Paramedic TiLindell NoeaAnastasia PallMD as Attending Physician (Family Medicine)  Patient Active Problem List   Diagnosis Date Noted   Acute left ankle pain 10/11/2021   Intractable nausea and vomiting 10/08/2021   AKI (acute kidney [place]) ([place]/17[place]20[place]   Hyponatremia 10/08/2021   Decreased estrogen level 08/01/2021   Dementia (  [place]) 08/01/2021   Frail elderly 08/01/2021   Hyperparathyroidism ([place]) 08/01/2021   Spondylolisthesis 08/01/2021   Varicose veins of bilateral lower extremities with other complications 54/62/7035   Lumbago without sciatica 04/30/2021   Abnormal gait 06/11/2020   Adult failure to thrive syndrome 06/11/2020   Allergic rhinitis 06/11/2020   Anxiety 06/11/2020   Asthma 06/11/2020   Benign intracranial hypertension 06/11/2020   Body mass index (BMI) 45.0-49.9, adult ([place]) 06/11/2020   Bowel incontinence 06/11/2020   Cholelithiasis 06/11/2020   Chronic  sinusitis 06/11/2020   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   Daytime somnolence 06/11/2020   Diarrhea of presumed infectious origin 06/11/2020   Edema 06/11/2020   Family history of malignant neoplasm of gastrointestinal tract 06/11/2020   Gout 06/11/2020   History of fall 06/11/2020   History of infectious disease 06/11/2020   Insomnia 06/11/2020   Irregular bowel habits 06/11/2020   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   Lumbar spondylosis with myelopathy 06/11/2020   Macrocytosis 06/11/2020   Mild recurrent major depression ([place]) 06/11/2020   Personal history of colonic polyps 06/11/2020   Personal history of malignant neoplasm of breast 06/11/2020   Repeated falls 06/11/2020   Mixed anxiety and depressive disorder 06/11/2020   Irritable bowel syndrome 01/04/2020   Spinal stenosis of lumbar region 01/03/2020   Other symptoms and signs involving the musculoskeletal system 09/11/2019   Bilateral leg weakness 08/01/2019   Leg swelling 08/01/2019   Diabetes mellitus with neuropathy ([place]) 06/15/2019   Hypokalemia 11/08/2018   CKD (chronic kidney disease) stage 3, GFR 30-59 ml/min (HCC) 11/08/2018   Orthostatic hypotension 07/28/2017   SVD (spontaneous vaginal delivery)    Peripheral neuropathy    On home oxygen therapy    Migraines    Left bundle branch block    Hypothyroidism    Hypertension    Hyperlipidemia    Heart murmur    GERD (gastroesophageal reflux disease)    Exertional shortness of breath    Major depressive disorder    Back pain    Arthritis    Generalized anxiety disorder    Anemia    Chronic respiratory failure ([place]) 09/14/2013   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   Morbid obesity ([place]) 00/93/8182   Chronic systolic heart failure ([place]) 10/27/2012   Endometrial polyp 01/20/2012   Malignant tumor of breast ([place]) 03/26/2011   Unspecified vitamin D deficiency 03/26/2011    Current Outpatient Medications:    acetaminophen  (TYLENOL) 500 MG tablet, 2 tablets as needed for mild pain or fever of 101 or greater, Disp: , Rfl:    acetaZOLAMIDE (DIAMOX) 125 MG tablet, Take 1 tablet (125 mg total) by mouth daily., Disp: 90 tablet, Rfl: 3   carvedilol (COREG) 3.125 MG tablet, TAKE 1 TABLET (3.125 MG) BY MOUTH TWICE DAILY WITH MEALS, Disp: 180 tablet, Rfl: 3   citalopram (CELEXA) 40 MG tablet, Take 40 mg by mouth daily., Disp: , Rfl:    clonazePAM (KLONOPIN) 0.5 MG tablet, Take 1 tablet by mouth 2 (two) times daily as needed., Disp: , Rfl:    colestipol (COLESTID) 1 g tablet, , Disp: , Rfl:    dicyclomine (BENTYL) 20 MG tablet, Take 20 mg by mouth 3 (three) times daily before meals., Disp: , Rfl:    DULoxetine (CYMBALTA) 20 MG capsule, Take 20 mg by mouth daily., Disp: , Rfl:    fexofenadine (ALLEGRA) 180 MG tablet, TK 1 T PO  BID, Disp: , Rfl:    fludrocortisone (FLORINEF) 0.1 MG tablet, Take 1  tablet (100 mcg total) by mouth 2 (two) times daily. (Patient not taking: Reported on 08/19/2021), Disp: 180 tablet, Rfl: 3   fluticasone (FLONASE) 50 MCG/ACT nasal spray, Place 1 spray into both nostrils daily as needed for allergies or rhinitis., Disp: , Rfl:    gabapentin (NEURONTIN) 300 MG capsule, Take 1 capsule by mouth 3 (three) times daily., Disp: , Rfl:    gabapentin (NEURONTIN) 800 MG tablet, TAKE 1 TABLET AT BEDTIME AS NEEDED FOR NEUROPATHY (Patient not taking: Reported on 10/08/2021), Disp: 30 tablet, Rfl: 1   HYDROcodone-acetaminophen (NORCO/VICODIN) 5-325 MG tablet, , Disp: , Rfl:    hydrOXYzine (VISTARIL) 25 MG capsule, Take 1 capsule 4 times a day by oral route., Disp: , Rfl:    levothyroxine (SYNTHROID) 88 MCG tablet, TAKE 1 TABLET(88 MCG) BY MOUTH DAILY BEFORE BREAKFAST, Disp: 90 tablet, Rfl: 3   loperamide (IMODIUM A-D) 2 MG tablet, Take 2 mg by mouth 4 (four) times daily as needed for diarrhea or loose stools. (Patient not taking: Reported on 10/20/2021), Disp: , Rfl:    metolazone (ZAROXOLYN) 2.5 MG tablet, Take 1  tablet by mouth every other Tuesday. (Patient taking differently: Take 1 tablet by mouth every other Tuesday.), Disp: 12 tablet, Rfl: 3   midodrine (PROAMATINE) 10 MG tablet, TAKE 1 TABLET THREE TIMES DAILY WITH MEALS, Disp: 270 tablet, Rfl: 3   mirtazapine (REMERON) 30 MG tablet, , Disp: , Rfl:    omeprazole (PRILOSEC) 40 MG capsule, TK 1 C PO QD 30 MIN B BRE PRN, Disp: , Rfl:    pantoprazole (PROTONIX) 40 MG tablet, 1 tablet, Disp: , Rfl:    potassium chloride SA (KLOR-CON M) 20 MEQ tablet, Take 3 tablets (60 mEq total) by mouth 3 (three) times daily., Disp: 572 tablet, Rfl: 0   probenecid (BENEMID) 500 MG tablet, Take 1 tablet by mouth 2 (two) times daily., Disp: , Rfl:    Simethicone (GAS-X MAXIMUM STRENGTH PO), Take 1 tablet by mouth daily as needed (flatulance). (Patient not taking: Reported on 10/20/2021), Disp: , Rfl:    simvastatin (ZOCOR) 10 MG tablet, Take 1 tablet (10 mg total) by mouth every evening., Disp: 30 tablet, Rfl: 11   spironolactone (ALDACTONE) 25 MG tablet, Take 1 tablet (25 mg total) by mouth every evening. Needs appt for further refills, Disp: 90 tablet, Rfl: 0   torsemide (DEMADEX) 20 MG tablet, Take 4 tablets (80 mg total) by mouth every morning AND 3 tablets (60 mg total) every evening., Disp: 210 tablet, Rfl: 11   traMADol (ULTRAM) 50 MG tablet, TAKE 1-2 TABLETS BY MOUTH TWICE DAILY AS NEEDED FOR SEVERE KNEE PAIN, Disp: , Rfl:  Allergies  Allergen Reactions   Ceftin Anaphylaxis    Face and throat swell    Geodon [Ziprasidone Hcl] Hives   Lisinopril Other (See Comments)    angioedema   Shellfish Allergy Other (See Comments)    Gout exacerbation   Shellfish-Derived Products     Other reaction(s): Other   Cefuroxime     Other reaction(s): anaphylaxis   Sulfacetamide Sodium-Sulfur     Other reaction(s): itch   Ziprasidone     Other reaction(s): hives on left arm   Allopurinol Nausea Only and Other (See Comments)    weakness   Ativan [Lorazepam] Itching   Sulfa  Antibiotics Itching   Ultram [Tramadol Hcl] Itching   Ultram [Tramadol] Itching   Valium [Diazepam] Other (See Comments)    Patient states that diazepam doesn't relax, it has the opposite effect.  Social History   Socioeconomic History   Marital status: Married    Spouse name: Not on file   Number of children: 2   Years of education: 41   Highest education level: Master's degree (e.g., MA, MS, MEng, MEd, MSW, MBA)  Occupational History   Occupation: retired  Tobacco Use   Smoking status: Former    Packs/day: 0.10    Years: 26.00    Total pack years: 2.60    Types: Cigarettes    Quit date: 03/23/2021    Years since quitting: 0.6   Smokeless tobacco: Never  Vaping Use   Vaping Use: Never used  Substance and Sexual Activity   Alcohol use: No   Drug use: No   Sexual activity: Yes  Other Topics Concern   Not on file  Social History Narrative   Tobacco Use Cigarettes: Former Smoker, Quit in 2008   No Alcohol   No recreational drug use   Diet: Regular/Low Carb   Exercise: None   Occupation: disabled   Education: Research officer, political party, masters   Children: 2   Firearms: No   Therapist, art Use: 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      Social Determinants of Health   Financial Resource Strain: Not on file  Food Insecurity: Not on file  Transportation Needs: Not on file  Physical Activity: Not on file  Stress: Not on file  Social Connections: Not on file  Intimate Partner Violence: Not on file    Physical Exam      Future Appointments  Date Time Provider [place]  12/02/2021 10:00 AM [name], Tiffany L, DO [facility] None  12/03/2021  2:30 PM [facility] LAB [facility] None  12/10/2021  7:15 AM [facility] DEVICE REMOTES [facility]  12/22/2021  7:30 AM [facility] DEVICE REMOTES [facility]  12/24/2021 11:00 AM Conception Chancy, PsyD [facility]  12/25/2021  2:30 PM [facility] PA/NP [facility] None  03/04/2022  2:30 PM  Marzetta Board, DPM [facility]  03/11/2022  7:15 AM [facility] DEVICE REMOTES [facility]  04/01/2022  1:00 PM Hazle Coca, PhD [facility] None  04/01/2022  2:00 PM [facility]- NEUROPSYCH TECH [facility] None  04/08/2022  2:30 PM Hazle Coca, PhD [facility] None  04/15/2022  8:50 AM Narda Amber K, DO [facility] None  06/10/2022  7:15 AM [facility] DEVICE REMOTES [facility]  09/09/2022  7:15 AM [facility] DEVICE REMOTES [facility]  12/09/2022  7:15 AM [facility] DEVICE REMOTES [facility]     ACTION: Home visit completed

## 2021-12-01 NOTE — ED Triage Notes (Signed)
Pt arrived via EMS, from home, n/v generalized weakness x2-3 days. Orthostatic changes for EMS

## 2021-12-01 NOTE — ED Provider Triage Note (Signed)
Emergency Medicine Provider Triage Evaluation Note  LEANORE BIGGERS , a 71 y.o. female  was evaluated in triage.  Pt complains of NV today however she has had weakness for "a while" but the past three days are worse.  At least 1.5 weeks of the fatigue   She states she is weak all over. Denies focal weakness. No head injuries.   No abd or CP.   Review of Systems  Positive: Fatigue Negative: Fever   Physical Exam  BP 122/75   Pulse 81   Temp 98.4 F (36.9 C) (Oral)   Resp 20   SpO2 100%  Gen:   Awake, no distress   Resp:  Normal effort  MSK:   Moves extremities without difficulty  Other:  Diffusely weak. No focal weakness. Smile symmetric -- numerous missing teeth.   Medical Decision Making  Medically screening exam initiated at 2:59 PM.  Appropriate orders placed.  FELISE GEORGIA was informed that the remainder of the evaluation will be completed by another provider, this initial triage assessment does not replace that evaluation, and the importance of remaining in the ED until their evaluation is complete.  Labs, ct head, urine    Tedd Sias, [place] 12/01/21 1501

## 2021-12-01 NOTE — Telephone Encounter (Signed)
Paramedic called to inform me that when she visited patient to fill her pillbox and check on her from the heart failure clinic, she noted that patient was weak, dizzy, c/o HA and was orthostatic by 30 points.  She was sent to the ED for evaluation and was there at the time of the call.  An appt with psych has been set up with Dr. Elias Else re: pt's depression.  She was to f/u with her PCP Sela Hilding tomorrow and myself.  It's not clear if she will be admitted or available for me to see tomorrow.

## 2021-12-01 NOTE — ED Provider Notes (Signed)
[facility] DEPT Provider Note   CSN: [id number] Arrival date & time: 12/01/21  1359     History  Chief Complaint  Patient presents with   Weakness   Nausea   Headache    Rhonda [name] is a 71 y.o. female.  HPI 71 year old female presents with a chief complaint of headache and vomiting. Headache has been for over 1 week. Over the weekend and today (Monday) she's had fatigue and had 2 episodes of vomiting today. Still has nausea. No fevers, vision complaints or focal weakness. Feels diffusely weak. No chest pain but feels like she has some trouble catching her breath. Legs felt like they wanted to give out on her today. Husband states he's concerned she's dehydrated. No falls or syncope. Denies abdominal pain.   Home Medications Prior to Admission medications   Medication Sig Start Date End Date Taking? Authorizing Provider  acetaminophen (TYLENOL) 500 MG tablet 2 tablets as needed for mild pain or fever of 101 or greater    [provider]  acetaZOLAMIDE (DIAMOX) 125 MG tablet Take 1 tablet (125 mg total) by mouth daily. 02/27/21   Patel, Arvin Collard K, DO  carvedilol (COREG) 3.125 MG tablet TAKE 1 TABLET (3.125 MG) BY MOUTH TWICE DAILY WITH MEALS 10/01/21   Joette Catching, PA-C  citalopram (CELEXA) 40 MG tablet Take 40 mg by mouth daily.    [provider]  clonazePAM (KLONOPIN) 0.5 MG tablet Take 1 tablet by mouth 2 (two) times daily as needed. 10/15/21   [provider]  colestipol (COLESTID) 1 g tablet  06/15/19   [provider]  dicyclomine (BENTYL) 20 MG tablet Take 20 mg by mouth 3 (three) times daily before meals.    [provider]  DULoxetine (CYMBALTA) 20 MG capsule Take 20 mg by mouth daily.    [provider]  fexofenadine (ALLEGRA) 180 MG tablet TK 1 T PO  BID Patient not taking: Reported on 12/01/2021 06/15/19   [provider]  fludrocortisone (FLORINEF) 0.1 MG tablet Take 1  tablet (100 mcg total) by mouth 2 (two) times daily. Patient not taking: Reported on 08/19/2021 02/27/21   Narda Amber K, DO  fluticasone [facility]) 50 MCG/ACT nasal spray Place 1 spray into both nostrils daily as needed for allergies or rhinitis.    [provider]  gabapentin (NEURONTIN) 300 MG capsule Take 1 capsule by mouth 3 (three) times daily. 10/12/21   [provider]  gabapentin (NEURONTIN) 800 MG tablet TAKE 1 TABLET AT BEDTIME AS NEEDED FOR NEUROPATHY Patient not taking: Reported on 12/01/2021 08/13/21   Alda Berthold, DO  HYDROcodone-acetaminophen (NORCO/VICODIN) 5-325 MG tablet     [provider]  hydrOXYzine (VISTARIL) 25 MG capsule Take 1 capsule 4 times a day by oral route. Patient not taking: Reported on 12/01/2021    [provider]  levothyroxine (SYNTHROID) 88 MCG tablet TAKE 1 TABLET(88 MCG) BY MOUTH DAILY BEFORE BREAKFAST 07/08/20   Philemon Kingdom, MD  loperamide (IMODIUM A-D) 2 MG tablet Take 2 mg by mouth 4 (four) times daily as needed for diarrhea or loose stools. Patient not taking: Reported on 10/20/2021    [provider]  metolazone (ZAROXOLYN) 2.5 MG tablet Take 1 tablet by mouth every other Tuesday. Patient taking differently: Take 1 tablet by mouth every other Tuesday. 10/01/21   Joette Catching, PA-C  midodrine (PROAMATINE) 10 MG tablet TAKE 1 TABLET THREE TIMES DAILY WITH MEALS 10/01/21  Joette Catching, PA-C  mirtazapine (REMERON) 30 MG tablet     [provider]  omeprazole (PRILOSEC) 40 MG capsule TK 1 C PO QD 30 MIN B BRE PRN Patient not taking: Reported on 12/01/2021    [provider]  pantoprazole (PROTONIX) 40 MG tablet 1 tablet 10/13/21   [provider]  potassium chloride SA (KLOR-CON M) 20 MEQ tablet Take 3 tablets (60 mEq total) by mouth 3 (three) times daily. 10/01/21   Joette Catching, PA-C  probenecid (BENEMID) 500 MG tablet Take 1 tablet by mouth 2 (two) times  daily. Patient not taking: Reported on 12/01/2021    [provider]  Simethicone (GAS-X MAXIMUM STRENGTH PO) Take 1 tablet by mouth daily as needed (flatulance).    [provider]  simvastatin (ZOCOR) 10 MG tablet Take 1 tablet (10 mg total) by mouth every evening. 10/01/21   Joette Catching, PA-C  spironolactone (ALDACTONE) 25 MG tablet Take 1 tablet (25 mg total) by mouth every evening. Needs appt for further refills 10/01/21   Joette Catching, PA-C  torsemide (DEMADEX) 20 MG tablet Take 4 tablets (80 mg total) by mouth every morning AND 3 tablets (60 mg total) every evening. 10/01/21   Joette Catching, PA-C  traMADol (ULTRAM) 50 MG tablet TAKE 1-2 TABLETS BY MOUTH TWICE DAILY AS NEEDED FOR SEVERE KNEE PAIN Patient not taking: Reported on 12/01/2021    [provider]      Allergies    Ceftin, Geodon [ziprasidone hcl], Lisinopril, Shellfish allergy, Shellfish-derived products, Cefuroxime, Sulfacetamide sodium-sulfur, Ziprasidone, Allopurinol, Ativan [lorazepam], Sulfa antibiotics, Ultram [tramadol hcl], Ultram [tramadol], and Valium [diazepam]    Review of Systems   Review of Systems  Constitutional:  Positive for fatigue. Negative for fever.  Eyes:  Negative for visual disturbance.  Cardiovascular:  Negative for chest pain.  Gastrointestinal:  Positive for nausea and vomiting. Negative for abdominal pain and diarrhea.  Genitourinary:  Negative for dysuria.  Neurological:  Positive for weakness and headaches.    Physical Exam Updated Vital Signs BP (!) 152/76   Pulse 72   Temp 98 F (36.7 C) (Oral)   Resp (!) 21   SpO2 100%  Physical Exam Vitals and nursing note reviewed.  Constitutional:      Appearance: She is well-developed.  HENT:     Head: Normocephalic and atraumatic.  Eyes:     Extraocular Movements: Extraocular movements intact.     Pupils: Pupils are equal, round, and reactive to light.  Cardiovascular:     Rate and Rhythm:  Normal rate and regular rhythm.     Heart sounds: Normal heart sounds.  Pulmonary:     Effort: Pulmonary effort is normal.     Breath sounds: Normal breath sounds.  Abdominal:     Palpations: Abdomen is soft.     Tenderness: There is abdominal tenderness in the right lower quadrant and left lower quadrant.  Musculoskeletal:     Cervical back: No rigidity.  Skin:    General: Skin is warm and dry.  Neurological:     Mental Status: She is alert.     Comments: CN 3-12 grossly intact. 5/5 strength in all 4 extremities. Grossly normal sensation. Normal finger to nose.      ED Results / Procedures / Treatments   Labs (all labs ordered are listed, but only abnormal results are displayed) Labs Reviewed  COMPREHENSIVE METABOLIC PANEL - Abnormal; Notable for the following components:  Result Value   Sodium 133 (*)    Potassium 3.0 (*)    Chloride 91 (*)    Glucose, Bld 173 (*)    BUN 52 (*)    Creatinine, Ser 1.82 (*)    Total Protein 9.1 (*)    Alkaline Phosphatase 200 (*)    GFR, Estimated 30 (*)    All other components within normal limits  URINALYSIS, ROUTINE W REFLEX MICROSCOPIC - Abnormal; Notable for the following components:   Leukocytes,Ua MODERATE (*)    Bacteria, UA RARE (*)    All other components within normal limits  TROPONIN I (HIGH SENSITIVITY) - Abnormal; Notable for the following components:   Troponin I (High Sensitivity) 39 (*)    All other components within normal limits  TROPONIN I (HIGH SENSITIVITY) - Abnormal; Notable for the following components:   Troponin I (High Sensitivity) 41 (*)    All other components within normal limits  LIPASE, BLOOD  CBC    EKG EKG Interpretation  Date/Time:  Monday December 01 2021 19:57:18 EDT Ventricular Rate:  84 PR Interval:  153 QRS Duration: 172 QT Interval:  456 QTC Calculation: 540 R Axis:   234 Text Interpretation: Atrial-sensed ventricular-paced rhythm Consider right atrial enlargement Right bundle branch  block Non-specific ST-t changes Confirmed by Regan Lemming ([id number]) on 12/01/2021 8:05:30 PM  Radiology CT ABDOMEN PELVIS WO CONTRAST  Result Date: 12/01/2021 CLINICAL DATA:  Nausea and vomiting.  Weakness. EXAM: CT ABDOMEN AND PELVIS WITHOUT CONTRAST TECHNIQUE: Multidetector CT imaging of the abdomen and pelvis was performed following the standard protocol without IV contrast. RADIATION DOSE REDUCTION: This exam was performed according to the departmental dose-optimization program which includes automated exposure control, adjustment of the mA and/or kV according to patient size and/or use of iterative reconstruction technique. COMPARISON:  CT 10/06/2021 FINDINGS: Lower chest: Pacemaker wires partially included. The heart is normal in size. No pleural effusion or focal airspace disease. Hepatobiliary: Intraluminal gallstones. Phrygian cap in the gallbladder. No definite pericholecystic fat stranding or inflammation by CT. Common bile duct is 7 mm, stable from prior, upper normal, but possibly normal for age. No visualized choledocholithiasis. No evidence of focal hepatic lesion on this unenhanced exam. Pancreas: Mild fatty atrophy.  No ductal dilatation or inflammation. Spleen: Normal in size without focal abnormality. Adrenals/Urinary Tract: Normal adrenal glands. No hydronephrosis. No renal calculi. No focal renal lesion. Unremarkable urinary bladder. Stomach/Bowel: Small hiatal hernia. Decompressed stomach. No bowel obstruction or inflammation. Normal appendix. Small to moderate colonic stool burden, mild rectal distention with stool. No colonic inflammation. Vascular/Lymphatic: Aortic and branch atherosclerosis. No aortic aneurysm. No portal venous or mesenteric gas. No bulky abdominopelvic adenopathy. Reproductive: Again seen uterine fibroid.  No adnexal mass. Other: No free air, free fluid, or intra-abdominal fluid collection. Small fat containing umbilical hernia. Musculoskeletal: L5-S1 degenerative disc  disease. Lower lumbar facet hypertrophy. The bones are under mineralized. No acute osseous abnormalities. IMPRESSION: 1. No acute abnormality in the abdomen/pelvis. 2. Cholelithiasis without CT findings of acute cholecystitis. Stable common bile duct prominence. 3. Small hiatal hernia. 4. Uterine fibroid. Aortic Atherosclerosis (ICD10-I70.0). Electronically Signed   By: Keith Rake M.D.   On: 12/01/2021 21:11   DG Chest 2 View  Result Date: 12/01/2021 CLINICAL DATA:  71 year old female presents for evaluation in of weakness. EXAM: CHEST - 2 VIEW COMPARISON:  Oct 08, 2021. FINDINGS: Multi lead pacer defibrillator, power pack over the LEFT chest similar to the prior study. Heart size is stable. Mediastinal contours are stable.  No effusion. No consolidation. No pneumothorax. On limited assessment there is no acute skeletal finding. IMPRESSION: No acute cardiopulmonary disease. Electronically Signed   By: Zetta Bills M.D.   On: 12/01/2021 15:42   CT HEAD WO CONTRAST (5MM)  Result Date: 12/01/2021 CLINICAL DATA:  A 71 year old female presents for evaluation of delirium. EXAM: CT HEAD WITHOUT CONTRAST TECHNIQUE: Contiguous axial images were obtained from the base of the skull through the vertex without intravenous contrast. RADIATION DOSE REDUCTION: This exam was performed according to the departmental dose-optimization program which includes automated exposure control, adjustment of the mA and/or kV according to patient size and/or use of iterative reconstruction technique. COMPARISON:  Imaging from March 11, 2019. FINDINGS: Brain: No evidence of acute infarction, hemorrhage, hydrocephalus, extra-axial collection or mass lesion/mass effect. Vascular: No hyperdense vessel or unexpected calcification. Skull: Normal. Negative for fracture or focal lesion. Sinuses/Orbits: Mild paranasal sinus disease with opacification of LEFT sphenoid sinus with frothy secretions. No substantial mucosal thickening  elsewhere or air-fluid levels. Visualized orbits are unremarkable. Other: None. IMPRESSION: 1. No acute intracranial abnormality. 2. Mild sinusitis. Electronically Signed   By: Zetta Bills M.D.   On: 12/01/2021 15:39    Procedures Procedures    Medications Ordered in ED Medications  potassium chloride SA (KLOR-CON M) CR tablet 40 mEq (40 mEq Oral Given 12/01/21 2146)  lactated ringers bolus 1,000 mL (0 mLs Intravenous Stopped 12/01/21 2325)  metoCLOPramide (REGLAN) injection 5 mg (5 mg Intravenous Given 12/01/21 2131)    ED Course/ Medical Decision Making/ A&P                           Medical Decision Making Amount and/or Complexity of Data Reviewed Labs: ordered.    Details: Troponins are elevated but flat at 39 and 41.  Creatinine of 1.8 is elevated from baseline but similar to 1.9 on 6/21. Radiology: ordered and independent interpretation performed.    Details: Cholelithiasis on CT abdomen.  CT head without acute head bleed. ECG/medicine tests: ordered and independent interpretation performed.    Details: Paced rhythm.  No significant change from baseline and no obvious ischemia.  Risk Prescription drug management. Decision regarding hospitalization.   Patient with vomiting and mild headache.  Headache seems to respond to Tylenol at home.  Does have some lower abdominal tenderness on exam and so a CT was obtained but does not show any evidence of diverticulitis.  Personally viewed/interpret these images.  Cholelithiasis but does not seem to clinically correlate and has normal LFTs/lipase.  Of note, creatinine is higher than her typical baseline, though similar to the last month and so this does not seem to be an acute process.  However with her BUN being higher there could be a dehydration component.  She was given IV fluids and Reglan and feels a lot better.  Of note, troponins have been sent due to her generalized weakness in triage.  They are mildly elevated at 39 and 41 but  given they are flat I suspect this is more related to renal disease and hypertension and her known history of CHF rather than ACS.  There is no chest pain.  ECG without acute ischemia.  However she also has multiple ACS risk factors. We discussed options and she/husband would like to stay for observation and she likely needs an echo as well to rule out wall motion abnormality. Dr. Myna Hidalgo will admit.        Final Clinical Impression(s) /  ED Diagnoses Final diagnoses:  Generalized weakness  Elevated troponin    Rx / DC Orders ED Discharge Orders     None         Sherwood Gambler, MD 12/01/21 2336

## 2021-12-02 ENCOUNTER — Other Ambulatory Visit: Payer: Medicare HMO | Admitting: Internal Medicine

## 2021-12-02 ENCOUNTER — Encounter (HOSPITAL_COMMUNITY): Payer: Self-pay | Admitting: Family Medicine

## 2021-12-02 DIAGNOSIS — E039 Hypothyroidism, unspecified: Secondary | ICD-10-CM | POA: Diagnosis not present

## 2021-12-02 DIAGNOSIS — I951 Orthostatic hypotension: Secondary | ICD-10-CM | POA: Diagnosis not present

## 2021-12-02 DIAGNOSIS — R531 Weakness: Secondary | ICD-10-CM | POA: Diagnosis not present

## 2021-12-02 DIAGNOSIS — R112 Nausea with vomiting, unspecified: Secondary | ICD-10-CM | POA: Diagnosis not present

## 2021-12-02 DIAGNOSIS — J45909 Unspecified asthma, uncomplicated: Secondary | ICD-10-CM | POA: Diagnosis not present

## 2021-12-02 DIAGNOSIS — E1122 Type 2 diabetes mellitus with diabetic chronic kidney disease: Secondary | ICD-10-CM | POA: Diagnosis not present

## 2021-12-02 DIAGNOSIS — R778 Other specified abnormalities of plasma proteins: Secondary | ICD-10-CM | POA: Diagnosis present

## 2021-12-02 DIAGNOSIS — N1832 Chronic kidney disease, stage 3b: Secondary | ICD-10-CM | POA: Diagnosis not present

## 2021-12-02 DIAGNOSIS — E1140 Type 2 diabetes mellitus with diabetic neuropathy, unspecified: Secondary | ICD-10-CM | POA: Diagnosis not present

## 2021-12-02 DIAGNOSIS — R7989 Other specified abnormal findings of blood chemistry: Secondary | ICD-10-CM

## 2021-12-02 HISTORY — DX: Other specified abnormal findings of blood chemistry: R79.89

## 2021-12-02 LAB — COMPREHENSIVE METABOLIC PANEL
ALT: 14 U/L (ref 0–44)
AST: 23 U/L (ref 15–41)
Albumin: 3.6 g/dL (ref 3.5–5.0)
Alkaline Phosphatase: 168 U/L — ABNORMAL HIGH (ref 38–126)
Anion gap: 15 (ref 5–15)
BUN: 51 mg/dL — ABNORMAL HIGH (ref 8–23)
CO2: 26 mmol/L (ref 22–32)
Calcium: 9.5 mg/dL (ref 8.9–10.3)
Chloride: 96 mmol/L — ABNORMAL LOW (ref 98–111)
Creatinine, Ser: 1.78 mg/dL — ABNORMAL HIGH (ref 0.44–1.00)
GFR, Estimated: 31 mL/min — ABNORMAL LOW (ref >60)
Glucose, Bld: 222 mg/dL — ABNORMAL HIGH (ref 70–99)
Potassium: 2.8 mmol/L — ABNORMAL LOW (ref 3.5–5.1)
Sodium: 137 mmol/L (ref 135–145)
Total Bilirubin: 1.1 mg/dL (ref 0.3–1.2)
Total Protein: 8.1 g/dL (ref 6.5–8.1)

## 2021-12-02 LAB — CBC
HCT: 35.8 % — ABNORMAL LOW (ref 36.0–46.0)
Hemoglobin: 12.3 g/dL (ref 12.0–15.0)
MCH: 33.7 pg (ref 26.0–34.0)
MCHC: 34.4 g/dL (ref 30.0–36.0)
MCV: 98.1 fL (ref 80.0–100.0)
Platelets: 234 10*3/uL (ref 150–400)
RBC: 3.65 MIL/uL — ABNORMAL LOW (ref 3.87–5.11)
RDW: 12.6 % (ref 11.5–15.5)
WBC: 9.1 10*3/uL (ref 4.0–10.5)
nRBC: 0 % (ref 0.0–0.2)

## 2021-12-02 LAB — GLUCOSE, CAPILLARY
Glucose-Capillary: 138 mg/dL — ABNORMAL HIGH (ref 70–99)
Glucose-Capillary: 249 mg/dL — ABNORMAL HIGH (ref 70–99)

## 2021-12-02 LAB — BASIC METABOLIC PANEL
Anion gap: 10 (ref 5–15)
BUN: 49 mg/dL — ABNORMAL HIGH (ref 8–23)
CO2: 28 mmol/L (ref 22–32)
Calcium: 9.8 mg/dL (ref 8.9–10.3)
Chloride: 99 mmol/L (ref 98–111)
Creatinine, Ser: 1.68 mg/dL — ABNORMAL HIGH (ref 0.44–1.00)
GFR, Estimated: 33 mL/min — ABNORMAL LOW (ref >60)
Glucose, Bld: 139 mg/dL — ABNORMAL HIGH (ref 70–99)
Potassium: 4.3 mmol/L (ref 3.5–5.1)
Sodium: 137 mmol/L (ref 135–145)

## 2021-12-02 LAB — MAGNESIUM: Magnesium: 2.3 mg/dL (ref 1.7–2.4)

## 2021-12-02 MED ORDER — POTASSIUM CHLORIDE 10 MEQ/100ML IV SOLN
10.0000 meq | INTRAVENOUS | Status: AC
  Administered 2021-12-02 (×2): 10 meq via INTRAVENOUS
  Filled 2021-12-02: qty 100

## 2021-12-02 MED ORDER — SIMVASTATIN 10 MG PO TABS
10.0000 mg | ORAL_TABLET | Freq: Every evening | ORAL | Status: DC
Start: 2021-12-02 — End: 2021-12-02

## 2021-12-02 MED ORDER — POTASSIUM CHLORIDE 10 MEQ/100ML IV SOLN: 10.0000 meq | INTRAVENOUS | Status: DC

## 2021-12-02 MED ORDER — CLONAZEPAM 0.5 MG PO TABS: 0.5000 mg | ORAL_TABLET | Freq: Two times a day (BID) | ORAL | Status: DC | PRN

## 2021-12-02 MED ORDER — HEPARIN SODIUM (PORCINE) 5000 UNIT/ML IJ SOLN
5000.0000 [IU] | Freq: Three times a day (TID) | INTRAMUSCULAR | Status: DC
Start: 2021-12-02 — End: 2021-12-02
  Administered 2021-12-02: 5000 [IU] via SUBCUTANEOUS
  Filled 2021-12-02: qty 1

## 2021-12-02 MED ORDER — CARVEDILOL 3.125 MG PO TABS
3.1250 mg | ORAL_TABLET | Freq: Two times a day (BID) | ORAL | Status: DC
Start: 2021-12-02 — End: 2021-12-02
  Administered 2021-12-02: 3.125 mg via ORAL
  Filled 2021-12-02: qty 1

## 2021-12-02 MED ORDER — ACETAMINOPHEN 650 MG RE SUPP: 650.0000 mg | Freq: Four times a day (QID) | RECTAL | Status: DC | PRN

## 2021-12-02 MED ORDER — SENNOSIDES-DOCUSATE SODIUM 8.6-50 MG PO TABS: 1.0000 | ORAL_TABLET | Freq: Every evening | ORAL | Status: DC | PRN

## 2021-12-02 MED ORDER — CITALOPRAM HYDROBROMIDE 20 MG PO TABS
20.0000 mg | ORAL_TABLET | Freq: Every day | ORAL | Status: DC
  Administered 2021-12-02: 20 mg via ORAL
  Filled 2021-12-02: qty 1

## 2021-12-02 MED ORDER — LEVOTHYROXINE SODIUM 88 MCG PO TABS
88.0000 ug | ORAL_TABLET | Freq: Every day | ORAL | Status: DC
  Administered 2021-12-02: 88 ug via ORAL
  Filled 2021-12-02: qty 1

## 2021-12-02 MED ORDER — OXYCODONE HCL 5 MG PO TABS: 5.0000 mg | ORAL_TABLET | Freq: Four times a day (QID) | ORAL | Status: DC | PRN

## 2021-12-02 MED ORDER — SODIUM CHLORIDE 0.9% FLUSH: 3.0000 mL | Freq: Two times a day (BID) | INTRAVENOUS | Status: DC

## 2021-12-02 MED ORDER — LACTATED RINGERS IV SOLN: INTRAVENOUS | Status: AC

## 2021-12-02 MED ORDER — ORAL CARE MOUTH RINSE
15.0000 mL | OROMUCOSAL | Status: DC | PRN
Start: 2021-12-02 — End: 2021-12-02

## 2021-12-02 MED ORDER — PANTOPRAZOLE SODIUM 40 MG PO TBEC
40.0000 mg | DELAYED_RELEASE_TABLET | Freq: Every day | ORAL | Status: DC
  Administered 2021-12-02: 40 mg via ORAL
  Filled 2021-12-02: qty 1

## 2021-12-02 MED ORDER — INSULIN ASPART 100 UNIT/ML IJ SOLN
0.0000 [IU] | Freq: Three times a day (TID) | INTRAMUSCULAR | Status: DC
  Administered 2021-12-02: 2 [IU] via SUBCUTANEOUS

## 2021-12-02 MED ORDER — GABAPENTIN 300 MG PO CAPS
300.0000 mg | ORAL_CAPSULE | Freq: Two times a day (BID) | ORAL | Status: DC
  Administered 2021-12-02: 300 mg via ORAL
  Filled 2021-12-02: qty 1

## 2021-12-02 MED ORDER — POTASSIUM CHLORIDE CRYS ER 20 MEQ PO TBCR
40.0000 meq | EXTENDED_RELEASE_TABLET | Freq: Once | ORAL | Status: AC
Start: 2021-12-02 — End: 2021-12-02
  Administered 2021-12-02: 40 meq via ORAL
  Filled 2021-12-02: qty 2

## 2021-12-02 MED ORDER — ACETAMINOPHEN 325 MG PO TABS: 650.0000 mg | ORAL_TABLET | Freq: Four times a day (QID) | ORAL | Status: DC | PRN

## 2021-12-02 MED ORDER — DICYCLOMINE HCL 20 MG PO TABS
20.0000 mg | ORAL_TABLET | Freq: Three times a day (TID) | ORAL | Status: DC
  Administered 2021-12-02 (×2): 20 mg via ORAL
  Filled 2021-12-02 (×3): qty 1

## 2021-12-02 NOTE — TOC Initial Note (Signed)
Transition of Care [facility]) - Initial/Assessment Note    Patient Details  Name: Rhonda [name] MRN: [id number] Date of Birth: [birth year]  Transition of Care [facility]) CM/SW Contact:    Leeroy Cha, RN Phone Number: 12/02/2021, 8:13 AM  Clinical Narrative:                  Transition of Care [facility]) Screening Note   Patient Details  Name: Rhonda [name] Date of Birth: 1950-10-21   Transition of Care [facility]) CM/SW Contact:    Leeroy Cha, RN Phone Number: 12/02/2021, 8:13 AM    Transition of Care Department [facility]) has reviewed patient and no TOC needs have been identified at this time. We will continue to monitor patient advancement through interdisciplinary progression rounds. If new patient transition needs arise, please place a TOC consult.    Expected Discharge Plan: Home/Self Care Barriers to Discharge: Continued Medical Work up   Patient Goals and CMS Choice Patient states their goals for this hospitalization and ongoing recovery are:: to go home CMS Medicare.gov Compare Post Acute Care list provided to:: Patient Choice offered to / list presented to : Patient  Expected Discharge Plan and Services Expected Discharge Plan: Home/Self Care       Living arrangements for the past 2 months: Single Family Home                                      Prior Living Arrangements/Services Living arrangements for the past 2 months: Single Family Home Lives with:: Spouse Patient language and need for interpreter reviewed:: Yes Do you feel safe going back to the place where you live?: Yes            Criminal Activity/Legal Involvement Pertinent to Current Situation/Hospitalization: No - Comment as needed  Activities of Daily Living Home Assistive Devices/Equipment: Dentures (specify type), Eyeglasses (Cleft palate plate) ADL Screening (condition at time of admission) Patient's cognitive ability adequate to safely complete daily activities?: Yes Is the patient  deaf or have difficulty hearing?: No Does the patient have difficulty seeing, even when wearing glasses/contacts?: No Does the patient have difficulty concentrating, remembering, or making decisions?: No Patient able to express need for assistance with ADLs?: Yes Does the patient have difficulty dressing or bathing?: No Independently performs ADLs?: Yes (appropriate for developmental age) Does the patient have difficulty walking or climbing stairs?: No Weakness of Legs: Both Weakness of Arms/Hands: Both  Permission Sought/Granted                  Emotional Assessment Appearance:: Appears stated age     Orientation: : Oriented to Self, Oriented to Place, Oriented to  Time, Oriented to Situation Alcohol / Substance Use: Tobacco Use (states she quit 8 months ago) Psych Involvement: No (comment)  Admission diagnosis:  Nausea & vomiting [R11.2] Elevated troponin [R77.8] Generalized weakness [R53.1] Patient Active Problem List   Diagnosis Date Noted   Elevated troponin 12/02/2021   Nausea & vomiting 12/01/2021   Acute left ankle pain 10/11/2021   Intractable nausea and vomiting 10/08/2021   AKI (acute kidney injury) ([place]) 10/08/2021   Hyponatremia 10/08/2021   Decreased estrogen level 08/01/2021   Dementia ([place]) 08/01/2021   Frail elderly 08/01/2021   Hyperparathyroidism ([place]) 08/01/2021   Spondylolisthesis 08/01/2021   Varicose veins of bilateral lower extremities with other complications 63/05/6008   Lumbago without sciatica 04/30/2021   Abnormal gait  06/11/2020   Adult failure to thrive syndrome 06/11/2020   Allergic rhinitis 06/11/2020   Anxiety 06/11/2020   Asthma 06/11/2020   Benign intracranial hypertension 06/11/2020   Body mass index (BMI) 45.0-49.9, adult ([place]) 06/11/2020   Bowel incontinence 06/11/2020   Cholelithiasis 06/11/2020   Chronic sinusitis 06/11/2020   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   Daytime somnolence 06/11/2020   Diarrhea of presumed infectious  origin 06/11/2020   Edema 06/11/2020   Family history of malignant neoplasm of gastrointestinal tract 06/11/2020   Gout 06/11/2020   History of fall 06/11/2020   History of infectious disease 06/11/2020   Insomnia 06/11/2020   Irregular bowel habits 06/11/2020   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   Lumbar spondylosis with myelopathy 06/11/2020   Macrocytosis 06/11/2020   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/11/2020   Personal history of colonic polyps 06/11/2020   Personal history of malignant neoplasm of breast 06/11/2020   Repeated falls 06/11/2020   Mixed anxiety and depressive disorder 06/11/2020   Irritable bowel syndrome 01/04/2020   Spinal stenosis of lumbar region 01/03/2020   Other symptoms and signs involving the musculoskeletal system 09/11/2019   Bilateral leg weakness 08/01/2019   Leg swelling 08/01/2019   Diabetes mellitus with neuropathy ([place]) 06/15/2019   Hypokalemia 11/08/2018   CKD (chronic kidney disease) stage 3, GFR 30-59 ml/min (HCC) 11/08/2018   Orthostatic hypotension 07/28/2017   SVD (spontaneous vaginal delivery)    Peripheral neuropathy    On home oxygen therapy    Migraines    Left bundle branch block    Hypothyroidism    Hypertension    Hyperlipidemia    Heart murmur    GERD (gastroesophageal reflux disease)    Exertional shortness of breath    Major depressive disorder    Back pain    Arthritis    Generalized anxiety disorder    Anemia    Chronic respiratory failure ([place]) 09/14/2013   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 [place] [address] Phone: [phone] Fax: [phone]     Social Determinants of Health (SDOH) Interventions    Readmission Risk Interventions    10/10/2021   12:48 PM  Readmission Risk Prevention Plan  Transportation Screening Complete  PCP or Specialist Appt within 5-7 Days Complete  Home Care Screening Complete  Medication Review (RN CM) Complete

## 2021-12-02 NOTE — TOC Transition Note (Signed)
Transition of Care [facility]) - CM/SW Discharge Note   Patient Details  Name: Rhonda [name] MRN: [id number] Date of Birth: August 24, 1950  Transition of Care [facility]) CM/SW Contact:  Leeroy Cha, RN Phone Number: 12/02/2021, 2:49 PM   Clinical Narrative:    Dcd to home with no toc needs.   Final next level of care: Home/Self Care Barriers to Discharge: Barriers Resolved   Patient Goals and CMS Choice Patient states their goals for this hospitalization and ongoing recovery are:: to go home CMS Medicare.gov Compare Post Acute Care list provided to:: Patient Choice offered to / list presented to : Patient  Discharge Placement                       Discharge Plan and Services                                     Social Determinants of Health (SDOH) Interventions     Readmission Risk Interventions    10/10/2021   12:48 PM  Readmission Risk Prevention Plan  Transportation Screening Complete  PCP or Specialist Appt within 5-7 Days Complete  Home Care Screening Complete  Medication Review (RN CM) Complete

## 2021-12-02 NOTE — Discharge Summary (Signed)
Rhonda [name] YWV:[id number] DOB: August 26, 1950 DOA: 12/01/2021  PCP: [name], MD  Admit date: 12/01/2021  Discharge date: 12/02/2021  Admitted From: home   Disposition:  home   Recommendations for Outpatient Follow-up:   Follow up with PCP in 1-2 weeks   Home Health: N/A Equipment/Devices: N/A Consultations: None Discharge Condition: Improved CODE STATUS: Full code Diet Recommendation: Heart Healthy   Diet Order             Diet regular Room service appropriate? Yes; Fluid consistency: Thin  Diet effective now                    Chief Complaint  Patient presents with   Weakness   Nausea   Headache     Brief history of present illness from the day of admission and additional interim summary     Rhonda [name] is a pleasant 71 y.o. female with medical history significant for hypertension, type 2 diabetes mellitus, CKD 3B, depression, anxiety, hypothyroidism, orthostatic hypotension, and nonischemic cardiomyopathy, now presenting to the emergency department with nausea and vomiting.  The patient reports that up until several days ago, she had been doing well after admission for nausea and vomiting in May 2023.  She developed recurrent nausea a few days ago with nonbloody vomiting.  She has mild lower abdominal discomfort associated with this but denies diarrhea, fevers, or chills.  She has been unable to eat or drink much of anything in the past couple days and has developed a mild headache as well.  She denies dysuria or flank pain.  She denies chest pain or shortness of breath.   ED Course: Upon arrival to the ED, patient is found to be afebrile and saturating well on room air with stable blood pressure.  EKG features paced rhythm with RBBB and QTc of 540 ms.  Head CT is negative for acute  intracranial abnormality and CT of the abdomen pelvis is also negative for acute findings.  Chemistry panel notable for potassium 3.0, BUN 52, and creatinine 1.82.  Lipase was normal.  Troponin was 39 and then 41.  She was treated with a liter of LR, Reglan, and oral potassium in the ED.                                                                   Hospital Course    Patient was treated with IV fluid resuscitation and potassium supplementation.  The following morning after admission patient felt much improved.  Had no further nausea or vomiting.  She was able to eat a good breakfast without difficulty.  No further abdominal pain.  We discussed importance of eating and drinking to maintain hydration and nutritional status.  Patient is on a fairly restricted diet and would  benefit from outpatient dietary consultation.  Patient states she will try to eat and drink more than she has in the past.   Discharge diagnosis     Principal Problem:   Nausea & vomiting Active Problems:   Hypothyroidism   Hypertension   Major depressive disorder   Orthostatic hypotension   Hypokalemia   CKD (chronic kidney disease) stage 3, GFR 30-59 ml/min (HCC)   Diabetes mellitus with neuropathy (HCC)   Anxiety   Elevated troponin  Nausea & vomiting  -Resolved with IV fluid resuscitation - Exam is benign and no acute findings on CT abd/pelvis  -Tolerated regular diet this morning without difficulty.   Type II DM  - A1c was 7.0% in May 2023, diet-controlled at home     Hypertension  - Continue Coreg     Depression, anxiety  - Continue Celexa and Klonopin     Chronic HFpEF  - Hx of NICM with normal EF on TTE from January 2023  -Patient is discharged home on her usual medications however outpatient cardiologist may wish to review heart failure medications.  7. Prolonged QT interval  - QTc is 515 -Patient is on a percent paced rhythm   8. Hypokalemia  -Resolved work for repletion    9. Elevated  troponin - Troponin mildly elevated and flat  - No angina, presentation not suggestive of ACS  - Continue Coreg and Crestor      Discharge instructions    Discharge Instructions     Discharge instructions   Complete by: As directed    Please see your PCPin 1 week. You will need to have your sodium, potassium and kidney function blood test at that time.   Increase activity slowly   Complete by: As directed        Discharge Medications   Allergies as of 12/02/2021       Reactions   Ceftin Anaphylaxis   Face and throat swell    Geodon [ziprasidone Hcl] Hives   Lisinopril Other (See Comments)   angioedema   Cefuroxime    Other reaction(s): anaphylaxis   Sulfacetamide Sodium-sulfur    Other reaction(s): itch   Allopurinol Nausea Only, Other (See Comments)   weakness   Ativan [lorazepam] Itching   Sulfa Antibiotics Itching   Valium [diazepam] Other (See Comments)   Patient states that diazepam doesn't relax, it has the opposite effect.        Medication List     TAKE these medications    acetaminophen 500 MG tablet Commonly known as: TYLENOL Take 1,000 mg by mouth daily as needed (for pain and fever of 101 or greater).   acetaZOLAMIDE 125 MG tablet Commonly known as: DIAMOX Take 1 tablet (125 mg total) by mouth daily.   carvedilol 3.125 MG tablet Commonly known as: COREG TAKE 1 TABLET (3.125 MG) BY MOUTH TWICE DAILY WITH MEALS What changed:  how much to take how to take this when to take this additional instructions   citalopram 20 MG tablet Commonly known as: CELEXA Take 20 mg by mouth daily.   clonazePAM 0.5 MG tablet Commonly known as: KLONOPIN Take 0.5 mg by mouth 2 (two) times daily as needed for anxiety.   dicyclomine 20 MG tablet Commonly known as: BENTYL Take 20 mg by mouth 3 (three) times daily before meals.   gabapentin 300 MG capsule Commonly known as: NEURONTIN Take 300 mg by mouth 2 (two) times daily.   levothyroxine 88 MCG  tablet Commonly known as: SYNTHROID TAKE  1 TABLET(88 MCG) BY MOUTH DAILY BEFORE BREAKFAST What changed:  how much to take how to take this when to take this additional instructions   metolazone 2.5 MG tablet Commonly known as: ZAROXOLYN Take 1 tablet by mouth every other Tuesday. What changed:  how much to take how to take this when to take this   midodrine 10 MG tablet Commonly known as: PROAMATINE TAKE 1 TABLET THREE TIMES DAILY WITH MEALS What changed:  how much to take how to take this when to take this   pantoprazole 40 MG tablet Commonly known as: PROTONIX Take 40 mg by mouth daily as needed (for acid reflux).   potassium chloride SA 20 MEQ tablet Commonly known as: KLOR-CON M Take 3 tablets (60 mEq total) by mouth 3 (three) times daily.   simvastatin 10 MG tablet Commonly known as: ZOCOR Take 1 tablet (10 mg total) by mouth every evening.   spironolactone 25 MG tablet Commonly known as: ALDACTONE Take 1 tablet (25 mg total) by mouth every evening. Needs appt for further refills   torsemide 20 MG tablet Commonly known as: DEMADEX Take 4 tablets (80 mg total) by mouth every morning AND 3 tablets (60 mg total) every evening. What changed: See the new instructions.          Major procedures and Radiology Reports - PLEASE review detailed and final reports thoroughly  -      CT ABDOMEN PELVIS WO CONTRAST  Result Date: 12/01/2021 CLINICAL DATA:  Nausea and vomiting.  Weakness. EXAM: CT ABDOMEN AND PELVIS WITHOUT CONTRAST TECHNIQUE: Multidetector CT imaging of the abdomen and pelvis was performed following the standard protocol without IV contrast. RADIATION DOSE REDUCTION: This exam was performed according to the departmental dose-optimization program which includes automated exposure control, adjustment of the mA and/or kV according to patient size and/or use of iterative reconstruction technique. COMPARISON:  CT 10/06/2021 FINDINGS: Lower chest: Pacemaker  wires partially included. The heart is normal in size. No pleural effusion or focal airspace disease. Hepatobiliary: Intraluminal gallstones. Phrygian cap in the gallbladder. No definite pericholecystic fat stranding or inflammation by CT. Common bile duct is 7 mm, stable from prior, upper normal, but possibly normal for age. No visualized choledocholithiasis. No evidence of focal hepatic lesion on this unenhanced exam. Pancreas: Mild fatty atrophy.  No ductal dilatation or inflammation. Spleen: Normal in size without focal abnormality. Adrenals/Urinary Tract: Normal adrenal glands. No hydronephrosis. No renal calculi. No focal renal lesion. Unremarkable urinary bladder. Stomach/Bowel: Small hiatal hernia. Decompressed stomach. No bowel obstruction or inflammation. Normal appendix. Small to moderate colonic stool burden, mild rectal distention with stool. No colonic inflammation. Vascular/Lymphatic: Aortic and branch atherosclerosis. No aortic aneurysm. No portal venous or mesenteric gas. No bulky abdominopelvic adenopathy. Reproductive: Again seen uterine fibroid.  No adnexal mass. Other: No free air, free fluid, or intra-abdominal fluid collection. Small fat containing umbilical hernia. Musculoskeletal: L5-S1 degenerative disc disease. Lower lumbar facet hypertrophy. The bones are under mineralized. No acute osseous abnormalities. IMPRESSION: 1. No acute abnormality in the abdomen/pelvis. 2. Cholelithiasis without CT findings of acute cholecystitis. Stable common bile duct prominence. 3. Small hiatal hernia. 4. Uterine fibroid. Aortic Atherosclerosis (ICD10-I70.0). Electronically Signed   By: Keith Rake M.D.   On: 12/01/2021 21:11   DG Chest 2 View  Result Date: 12/01/2021 CLINICAL DATA:  71 year old female presents for evaluation in of weakness. EXAM: CHEST - 2 VIEW COMPARISON:  Oct 08, 2021. FINDINGS: Multi lead pacer defibrillator, power pack over the LEFT chest  similar to the prior study. Heart size  is stable. Mediastinal contours are stable. No effusion. No consolidation. No pneumothorax. On limited assessment there is no acute skeletal finding. IMPRESSION: No acute cardiopulmonary disease. Electronically Signed   By: Zetta Bills M.D.   On: 12/01/2021 15:42   CT HEAD WO CONTRAST (5MM)  Result Date: 12/01/2021 CLINICAL DATA:  A 71 year old female presents for evaluation of delirium. EXAM: CT HEAD WITHOUT CONTRAST TECHNIQUE: Contiguous axial images were obtained from the base of the skull through the vertex without intravenous contrast. RADIATION DOSE REDUCTION: This exam was performed according to the departmental dose-optimization program which includes automated exposure control, adjustment of the mA and/or kV according to patient size and/or use of iterative reconstruction technique. COMPARISON:  Imaging from March 11, 2019. FINDINGS: Brain: No evidence of acute infarction, hemorrhage, hydrocephalus, extra-axial collection or mass lesion/mass effect. Vascular: No hyperdense vessel or unexpected calcification. Skull: Normal. Negative for fracture or focal lesion. Sinuses/Orbits: Mild paranasal sinus disease with opacification of LEFT sphenoid sinus with frothy secretions. No substantial mucosal thickening elsewhere or air-fluid levels. Visualized orbits are unremarkable. Other: None. IMPRESSION: 1. No acute intracranial abnormality. 2. Mild sinusitis. Electronically Signed   By: Zetta Bills M.D.   On: 12/01/2021 15:39    Micro Results   No results found for this or any previous visit (from the past 240 hour(s)).  Today   Subjective    Rhonda [name] feels much improved since admission.  Feels ready to go home.  Denies chest pain, shortness of breath or abdominal pain.  Feels they can take care of themselves with the resources they have at home.  Objective   Blood pressure 121/82, pulse 73, temperature 98 F (36.7 C), temperature source Oral, resp. rate 18, height '5\' 3"'$  (1.6 m),  weight 84.9 kg, SpO2 100 %.   Intake/Output Summary (Last 24 hours) at 12/02/2021 1442 Last data filed at 12/02/2021 0938 Gross per 24 hour  Intake 1495.66 ml  Output --  Net 1495.66 ml    Exam General: Patient appears well and in good spirits sitting up in bed in no acute distress with attentive husband at bedside who is very involved with her care. Eyes: sclera anicteric, conjuctiva mild injection bilaterally CVS: S1-S2, regular  Respiratory:  decreased air entry bilaterally secondary to decreased inspiratory effort, rales at bases  GI: NABS, soft, NT  LE: No edema.  Neuro: A/O x 3, grossly nonfocal.    Data Review   CBC w Diff:  Lab Results  Component Value Date   WBC 9.1 12/02/2021   HGB 12.3 12/02/2021   HGB 11.1 04/09/2020   HGB 11.2 (L) 07/16/2011   HCT 35.8 (L) 12/02/2021   HCT 32.2 (L) 04/09/2020   HCT 33.2 (L) 07/16/2011   PLT 234 12/02/2021   PLT 314 04/09/2020   LYMPHOPCT 14 10/06/2021   LYMPHOPCT 14.5 07/16/2011   MONOPCT 6 10/06/2021   MONOPCT 6.5 07/16/2011   EOSPCT 1 10/06/2021   EOSPCT 4.8 07/16/2011   BASOPCT 0 10/06/2021   BASOPCT 0.1 07/16/2011    CMP:  Lab Results  Component Value Date   NA 137 12/02/2021   NA 142 04/09/2020   K 4.3 12/02/2021   CL 99 12/02/2021   CO2 28 12/02/2021   BUN 49 (H) 12/02/2021   BUN 40 (H) 04/09/2020   CREATININE 1.68 (H) 12/02/2021   CREATININE 1.85 (H) 02/10/2018   PROT 8.1 12/02/2021   ALBUMIN 3.6 12/02/2021   BILITOT 1.1 12/02/2021  ALKPHOS 168 (H) 12/02/2021   AST 23 12/02/2021   ALT 14 12/02/2021  .   Total Time in preparing paper work, data evaluation and todays exam - 35 minutes  Vashti Hey M.D on 12/02/2021 at 2:42 PM  [facility]

## 2021-12-02 NOTE — Progress Notes (Signed)
Patient discharged home as ordered, D/C instructions explained to pt and husband. Pt assisted to exit via W/C with assist from nursing.

## 2021-12-02 NOTE — H&P (Signed)
History and Physical    Rhonda [name] ZMO:[id number] DOB: 1950/06/05 DOA: 12/01/2021  PCP: [name], MD   Patient coming from: Home   Chief Complaint: N/V   HPI: Rhonda [name] is a pleasant 71 y.o. female with medical history significant for hypertension, type 2 diabetes mellitus, CKD 3B, depression, anxiety, hypothyroidism, orthostatic hypotension, and nonischemic cardiomyopathy, now presenting to the emergency department with nausea and vomiting.  The patient reports that up until several days ago, she had been doing well after admission for nausea and vomiting in May 2023.  She developed recurrent nausea a few days ago with nonbloody vomiting.  She has mild lower abdominal discomfort associated with this but denies diarrhea, fevers, or chills.  She has been unable to eat or drink much of anything in the past couple days and has developed a mild headache as well.  She denies dysuria or flank pain.  She denies chest pain or shortness of breath.  ED Course: Upon arrival to the ED, patient is found to be afebrile and saturating well on room air with stable blood pressure.  EKG features paced rhythm with RBBB and QTc of 540 ms.  Head CT is negative for acute intracranial abnormality and CT of the abdomen pelvis is also negative for acute findings.  Chemistry panel notable for potassium 3.0, BUN 52, and creatinine 1.82.  Lipase was normal.  Troponin was 39 and then 41.  She was treated with a liter of LR, Reglan, and oral potassium in the ED.  Review of Systems:  All other systems reviewed and apart from HPI, are negative.  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    Complication of anesthesia    History of low blood pressure after surgery; attributed to lying flat   Diabetes mellitus    "diet controlled" (05/03/2013)   Exertional shortness of breath    Generalized anxiety disorder    GERD (gastroesophageal reflux disease)    Gout    Heart murmur    Hyperlipidemia    Hypertension    Hypokalemia 11/08/2018   Hypothyroidism    Left bundle branch block    s/p CRT-D (04/2013)   Major depressive disorder    Migraines    On home oxygen therapy    "2L suppose to be q night" (05/03/2013)   Orthostatic hypotension 07/28/2017   Peripheral neuropathy    Feet   SVD (spontaneous vaginal delivery)    x 2   Unspecified vitamin D deficiency 03/26/2011   Does not take meds    Past Surgical History:  Procedure Laterality Date   BI-VENTRICULAR IMPLANTABLE CARDIOVERTER DEFIBRILLATOR N/A 05/03/2013   Procedure: BI-VENTRICULAR IMPLANTABLE CARDIOVERTER DEFIBRILLATOR  (CRT-D);  Surgeon: Evans Lance, MD;  Location: [facility] CATH LAB;  Service: Cardiovascular;  Laterality: N/A;   BI-VENTRICULAR IMPLANTABLE CARDIOVERTER DEFIBRILLATOR  (CRT-D)  05/03/2013   MDT CRTD implanted by Dr Lovena Le for non ischemic cardiomyopathy  BIOPSY  05/31/2018   Procedure: BIOPSY;  Surgeon: Ronnette Juniper, MD;  Location: [facility];  Service: Gastroenterology;;   BIV ICD GENERATOR CHANGEOUT N/A 04/11/2020   Procedure: BIV ICD GENERATOR CHANGEOUT;  Surgeon: Evans Lance, MD;  Location: [facility];  Service: Cardiovascular;  Laterality: N/A;   BREAST LUMPECTOMY Left 07/28/2010   CARDIAC CATHETERIZATION  2010   NORMAL CORONARY ARTERIES   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--11 SURGERIES     PT HAS REMOVABLE SPEECH BULB-TAKES IT OUT BEFORE HER SURGERY   COLONOSCOPY     COLONOSCOPY N/A 05/31/2018   Procedure: COLONOSCOPY;  Surgeon:  Ronnette Juniper, MD;  Location: [facility]OSCOPY;  Service: Gastroenterology;  Laterality: N/A;   HYSTEROSCOPY WITH D & C  01/20/2012   Procedure: DILATATION AND CURETTAGE /HYSTEROSCOPY;  Surgeon: Margarette Asal, MD;  Location: [facility];  Service: Gynecology;  Laterality: N/A;  with trueclear   PORT-A-CATH REMOVAL  09/23/2011   Procedure: REMOVAL PORT-A-CATH;  Surgeon: Rolm Bookbinder, MD;  Location: [facility];  Service: General;  Laterality: N/A;  Port Removal   PORTACATH PLACEMENT  2012   TONSILLECTOMY  1960's   TOTAL KNEE ARTHROPLASTY  05/10/2012   Procedure: TOTAL KNEE ARTHROPLASTY;  Surgeon: Mauri Pole, MD;  Location: [facility];  Service: Orthopedics;  Laterality: Right;   WISDOM TOOTH EXTRACTION      Social History:   reports that she quit smoking about 8 months ago. Her smoking use included cigarettes. She has a 2.60 pack-year smoking history. She has never used smokeless tobacco. She reports that she does not drink alcohol and does not use drugs.  Allergies  Allergen Reactions   Ceftin Anaphylaxis    Face and throat swell    Geodon [Ziprasidone Hcl] Hives   Lisinopril Other (See Comments)    angioedema   Cefuroxime     Other reaction(s): anaphylaxis   Sulfacetamide Sodium-Sulfur     Other reaction(s): itch   Allopurinol Nausea Only and Other (See Comments)    weakness   Ativan [Lorazepam] Itching   Sulfa Antibiotics Itching   Valium [Diazepam] Other (See Comments)    Patient states that diazepam doesn't relax, it has the opposite effect.    Family History  Problem Relation Age of Onset   Alzheimer's disease Mother    CVA Mother    Hypertension Mother    Cancer - Colon Father        late 56   Birth defects Paternal Uncle      Prior to Admission medications   Medication Sig Start Date End Date Taking? Authorizing Provider  acetaminophen (TYLENOL) 500 MG tablet Take 1,000 mg by mouth daily as needed (for pain and fever of 101 or greater).   Yes [provider]   acetaZOLAMIDE (DIAMOX) 125 MG tablet Take 1 tablet (125 mg total) by mouth daily. 02/27/21  Yes Patel, Donika K, DO  carvedilol (COREG) 3.125 MG tablet TAKE 1 TABLET (3.125 MG) BY MOUTH TWICE DAILY WITH MEALS Patient taking differently: Take 3.125 mg by mouth 2 (two) times daily with a meal. 10/01/21  Yes Joette Catching, PA-C  citalopram (CELEXA) 20 MG tablet Take 20 mg by mouth daily.   Yes [provider]  clonazePAM (KLONOPIN) 0.5 MG tablet Take 0.5 mg by mouth 2 (two) times daily as needed for anxiety. 10/15/21  Yes [provider]  dicyclomine (BENTYL) 20 MG tablet Take 20 mg by mouth 3 (three) times daily before meals.   Yes [provider]  gabapentin (NEURONTIN) 300 MG capsule Take 300 mg by mouth 2 (two) times daily. 10/12/21  Yes [provider]  levothyroxine (SYNTHROID) 88 MCG tablet TAKE 1 TABLET(88 MCG) BY MOUTH DAILY BEFORE BREAKFAST Patient taking differently: Take 88 mcg by mouth daily before breakfast. 07/08/20  Yes Philemon Kingdom, MD  metolazone (ZAROXOLYN) 2.5 MG tablet Take 1 tablet by mouth every other Tuesday. Patient taking differently: Take 2.5 mg by mouth See admin instructions. Take 1 tablet by mouth every other Tuesday. 10/01/21  Yes Joette Catching, PA-C  midodrine (PROAMATINE) 10 MG tablet TAKE 1 TABLET THREE TIMES DAILY WITH MEALS Patient taking differently: Take 10 mg by mouth 3 (three) times daily. TAKE 1 TABLET THREE TIMES DAILY WITH MEALS 10/01/21  Yes Joette Catching, PA-C  pantoprazole (PROTONIX) 40 MG tablet Take 40 mg by mouth daily as needed (for acid reflux). 10/13/21  Yes [provider]  potassium chloride SA (KLOR-CON M) 20 MEQ tablet Take 3 tablets (60 mEq total) by mouth 3 (three) times daily. 10/01/21  Yes Joette Catching, PA-C  simvastatin (ZOCOR) 10 MG tablet Take 1 tablet (10 mg total) by mouth every evening. 10/01/21  Yes Joette Catching, PA-C  torsemide (DEMADEX) 20 MG tablet  Take 4 tablets (80 mg total) by mouth every morning AND 3 tablets (60 mg total) every evening. Patient taking differently: Take 3 tablets (60 mg total) by mouth every morning AND 4 tablets (40 mg total) every evening. 10/01/21  Yes Joette Catching, PA-C  spironolactone (ALDACTONE) 25 MG tablet Take 1 tablet (25 mg total) by mouth every evening. Needs appt for further refills Patient not taking: Reported on 12/02/2021 10/01/21   Joette Catching, [place]    Physical Exam: Vitals:   12/02/21 0030 12/02/21 0111 12/02/21 0136 12/02/21 0332  BP: (!) 153/76 (!) 160/92  (!) 154/76  Pulse: 70 73  76  Resp: '17 20  17  '$ Temp: 98 F (36.7 C) 98.6 F (37 C)    TempSrc: Oral     SpO2: 100% 100%  100%  Weight:   84.9 kg   Height:   '5\' 3"'$  (1.6 m)     Constitutional: NAD, calm  Eyes: PERTLA, lids and conjunctivae normal ENMT: Mucous membranes are moist. Posterior pharynx clear of any exudate or lesions.   Neck: supple, no masses  Respiratory: no wheezing, no crackles. No accessory muscle use.  Cardiovascular: S1 & S2 heard, regular rate and rhythm. No extremity edema.  Abdomen: No distension, no tenderness, soft. Bowel sounds active.  Musculoskeletal: no clubbing / cyanosis. No joint deformity upper and lower extremities.   Skin: no significant rashes, lesions, ulcers. Warm, dry, well-perfused. Neurologic: CN 2-12 grossly intact. Sensation intact, DTR normal. Strength 5/5 in all 4 limbs. Alert and oriented.  Psychiatric: Pleasant. Cooperative.    Labs and Imaging on Admission: I have personally reviewed following labs and imaging studies  CBC: Recent Labs  Lab 12/01/21 1555 12/02/21 0354  WBC 9.4 9.1  HGB 13.7 12.3  HCT 40.2 35.8*  MCV 97.3 98.1  PLT 265 778   Basic Metabolic Panel: Recent Labs  Lab 12/01/21 1555  NA 133*  K 3.0*  CL 91*  CO2 27  GLUCOSE 173*  BUN 52*  CREATININE 1.82*  CALCIUM 9.6   GFR: Estimated Creatinine Clearance: 30.1 mL/min (A) (by C-G formula  based on SCr of 1.82 mg/dL (H)). Liver Function Tests: Recent Labs  Lab 12/01/21 1555  AST 22  ALT 17  ALKPHOS 200*  BILITOT 1.0  PROT 9.1*  ALBUMIN 4.3   Recent Labs  Lab 12/01/21 1555  LIPASE 29   No results for input(s): "AMMONIA" in the last 168 hours. Coagulation Profile: No results for input(s): "INR", "PROTIME" in the last 168 hours. Cardiac Enzymes: No results for input(s): "CKTOTAL", "CKMB", "CKMBINDEX", "TROPONINI" in the last 168 hours. BNP (last 3 results) No results for input(s): "PROBNP" in the last 8760 hours. HbA1C: No results for input(s): "HGBA1C" in the last 72 hours. CBG: No results for input(s): "GLUCAP" in the last 168 hours. Lipid Profile: No results for input(s): "CHOL", "HDL", "LDLCALC", "TRIG", "CHOLHDL", "LDLDIRECT" in the last 72 hours. Thyroid Function Tests: No results for input(s): "TSH", "T4TOTAL", "FREET4", "T3FREE", "THYROIDAB" in the last 72 hours. Anemia Panel: No results for input(s): "VITAMINB12", "FOLATE", "FERRITIN", "TIBC", "IRON", "RETICCTPCT" in the last 72 hours. Urine analysis:    Component Value Date/Time   COLORURINE YELLOW 12/01/2021 2256   APPEARANCEUR CLEAR 12/01/2021 2256   LABSPEC 1.010 12/01/2021 2256   LABSPEC 1.015 11/13/2010 1331   PHURINE 5.0 12/01/2021 2256   GLUCOSEU NEGATIVE 12/01/2021 2256   HGBUR NEGATIVE 12/01/2021 2256   BILIRUBINUR NEGATIVE 12/01/2021 2256   BILIRUBINUR Negative 11/13/2010 1331   KETONESUR NEGATIVE 12/01/2021 2256   PROTEINUR NEGATIVE 12/01/2021 2256   UROBILINOGEN 0.2 05/05/2012 1029   NITRITE NEGATIVE 12/01/2021 2256   LEUKOCYTESUR MODERATE (A) 12/01/2021 2256   LEUKOCYTESUR Moderate 11/13/2010 1331   Sepsis Labs: '@LABRCNTIP'$ (procalcitonin:4,lacticidven:4) )No results found for this or any previous visit (from the past 240 hour(s)).   Radiological Exams on Admission: CT ABDOMEN PELVIS WO CONTRAST  Result Date: 12/01/2021 CLINICAL DATA:  Nausea and vomiting.  Weakness. EXAM:  CT ABDOMEN AND PELVIS WITHOUT CONTRAST TECHNIQUE: Multidetector CT imaging of the abdomen and pelvis was performed following the standard protocol without IV contrast. RADIATION DOSE REDUCTION: This exam was performed according to the departmental dose-optimization program which includes automated exposure control, adjustment of the mA and/or kV according to patient size and/or use of iterative reconstruction technique. COMPARISON:  CT 10/06/2021 FINDINGS: Lower chest: Pacemaker wires partially included. The heart is normal in size. No pleural effusion or focal airspace disease. Hepatobiliary: Intraluminal gallstones. Phrygian cap in the gallbladder. No definite pericholecystic fat stranding or inflammation by CT. Common bile duct is 7 mm, stable from prior, upper normal, but possibly normal for age. No visualized choledocholithiasis. No evidence of focal hepatic lesion on this unenhanced exam. Pancreas: Mild fatty atrophy.  No ductal dilatation or inflammation. Spleen: Normal in size without focal abnormality. Adrenals/Urinary Tract: Normal adrenal glands. No hydronephrosis. No renal calculi. No focal renal lesion. Unremarkable urinary bladder. Stomach/Bowel: Small hiatal hernia. Decompressed stomach. No bowel obstruction or inflammation. Normal appendix. Small to moderate colonic stool burden, mild rectal distention with stool. No colonic inflammation. Vascular/Lymphatic: Aortic and branch atherosclerosis. No aortic aneurysm. No portal venous or mesenteric gas. No bulky abdominopelvic adenopathy. Reproductive: Again seen uterine fibroid.  No adnexal mass. Other: No free air, free fluid, or intra-abdominal fluid collection. Small fat containing umbilical hernia. Musculoskeletal: L5-S1 degenerative disc disease. Lower lumbar facet hypertrophy. The bones are under mineralized. No acute osseous abnormalities. IMPRESSION: 1. No acute abnormality in the abdomen/pelvis. 2. Cholelithiasis without CT findings of acute  cholecystitis. Stable common bile duct prominence. 3. Small hiatal hernia. 4. Uterine fibroid. Aortic Atherosclerosis (ICD10-I70.0). Electronically Signed   By: Keith Rake M.D.   On: 12/01/2021 21:11   DG Chest 2 View  Result Date: 12/01/2021  CLINICAL DATA:  71 year old female presents for evaluation in of weakness. EXAM: CHEST - 2 VIEW COMPARISON:  Oct 08, 2021. FINDINGS: Multi lead pacer defibrillator, power pack over the LEFT chest similar to the prior study. Heart size is stable. Mediastinal contours are stable. No effusion. No consolidation. No pneumothorax. On limited assessment there is no acute skeletal finding. IMPRESSION: No acute cardiopulmonary disease. Electronically Signed   By: Zetta Bills M.D.   On: 12/01/2021 15:42   CT HEAD WO CONTRAST (5MM)  Result Date: 12/01/2021 CLINICAL DATA:  A 71 year old female presents for evaluation of delirium. EXAM: CT HEAD WITHOUT CONTRAST TECHNIQUE: Contiguous axial images were obtained from the base of the skull through the vertex without intravenous contrast. RADIATION DOSE REDUCTION: This exam was performed according to the departmental dose-optimization program which includes automated exposure control, adjustment of the mA and/or kV according to patient size and/or use of iterative reconstruction technique. COMPARISON:  Imaging from March 11, 2019. FINDINGS: Brain: No evidence of acute infarction, hemorrhage, hydrocephalus, extra-axial collection or mass lesion/mass effect. Vascular: No hyperdense vessel or unexpected calcification. Skull: Normal. Negative for fracture or focal lesion. Sinuses/Orbits: Mild paranasal sinus disease with opacification of LEFT sphenoid sinus with frothy secretions. No substantial mucosal thickening elsewhere or air-fluid levels. Visualized orbits are unremarkable. Other: None. IMPRESSION: 1. No acute intracranial abnormality. 2. Mild sinusitis. Electronically Signed   By: Zetta Bills M.D.   On: 12/01/2021 15:39     EKG: Independently reviewed. Paced rhythm, RBBB, QTc 540.   Assessment/Plan   1. Nausea & vomiting  - Presents with several days of mild lower abdominal discomfort and recurrent N/V  - Exam is benign and no acute findings on CT abd/pelvis  - She is improving with treatment in ED and wants to try clears  - Continue supportive care, advance diet as tolerated    2. CKD IIIb  - SCr is 1.82 on admission, appears close to baseline  - Renally-dose medications, monitor   3. Type II DM  - A1c was 7.0% in May 2023, diet-controlled at home   - Check CBGs and use low-intensity SSI for now   4. Hypertension  - Continue Coreg    5. Depression, anxiety  - Continue Celexa and Klonopin    6. Chronic HFpEF  - Hx of NICM with normal EF on TTE from January 2023  - Hypovolemic on admission in setting of recent N/V  - Hold diuretics, continue IVF hydration until she is tolerating adequate oral intake, continue Coreg    7. Prolonged QT interval  - QTc is 540 ms in ED  - Replace potassium, check mag, avoid QT-prolonging medications    8. Hypokalemia  - Due to GI losses  - Replace    9. Elevated troponin - Troponin mildly elevated and flat  - No known hx of CAD, had normal cath in 2010  - No angina, presentation not suggestive of ACS  - Continue Coreg and Crestor     DVT prophylaxis: sq heparin  Code Status: Full  Level of Care: Level of care: Telemetry Family Communication: Husband at bedside  Disposition Plan:  Patient is from: home  Anticipated d/c is to: Home  Anticipated d/c date is: 7/11 or 12/03/21  Patient currently: pending tolerance of adequate oral intake  Consults called: none  Admission status: Observation    Vianne Bulls, MD [facility]  12/02/2021, 5:03 AM

## 2021-12-02 NOTE — Progress Notes (Signed)
Pt tolerated clear liquid diet overnight. Pt denies nausea and had no vomiting. RN will advance diet to regular per order and continue to monitor.

## 2021-12-03 ENCOUNTER — Ambulatory Visit (HOSPITAL_COMMUNITY)
Admission: RE | Admit: 2021-12-03 | Discharge: 2021-12-03 | Disposition: A | Discharge status: Home / Self Care | Payer: Medicare HMO | Source: Ambulatory Visit | Attending: Internal Medicine | Admitting: Internal Medicine

## 2021-12-03 DIAGNOSIS — I5022 Chronic systolic (congestive) heart failure: Secondary | ICD-10-CM | POA: Insufficient documentation

## 2021-12-03 LAB — BASIC METABOLIC PANEL
Anion gap: 11 (ref 5–15)
BUN: 33 mg/dL — ABNORMAL HIGH (ref 8–23)
CO2: 22 mmol/L (ref 22–32)
Calcium: 9.4 mg/dL (ref 8.9–10.3)
Chloride: 100 mmol/L (ref 98–111)
Creatinine, Ser: 1.33 mg/dL — ABNORMAL HIGH (ref 0.44–1.00)
GFR, Estimated: 43 mL/min — ABNORMAL LOW (ref >60)
Glucose, Bld: 205 mg/dL — ABNORMAL HIGH (ref 70–99)
Potassium: 4.3 mmol/L (ref 3.5–5.1)
Sodium: 133 mmol/L — ABNORMAL LOW (ref 135–145)

## 2021-12-03 LAB — MAGNESIUM: Magnesium: 2.3 mg/dL (ref 1.7–2.4)

## 2021-12-08 ENCOUNTER — Other Ambulatory Visit (HOSPITAL_COMMUNITY): Payer: Self-pay

## 2021-12-08 NOTE — Progress Notes (Signed)
Paramedicine Encounter    Patient ID: Rhonda [name], female    DOB: January 19, 1951, 71 y.o.   MRN: [id number]    Arrived for home visit for Rhonda [name] who reports feeling better this week. She stated she is not feeling dizzy or having the headaches she was having prior to her hospital visit.   She reports falling out of bed over the weekend,landing on her right wrist. She reports some pain upon movement but no bruising or swelling noted. Patient has an ace bandage on same with a wrist brace.  She reports no other injury.    She is ambulating around her home well with no signs of shortness of breath. No lower leg swelling noted. Vitals were obtained:  WT- 205 BP- 150/80 HR- 89 O2- 98% RR-16   She has not had any meds today as she woke up late. I encouraged her to be sure she is waking up early enough to get her before breakfast and morning meds in daily. She agreed. I made sure her [place] reminders were still set up.   I reviewed meds and confirmed same filling pill box for one week. She had missed several doses over the last week but was hospitalized for some of those days. I stressed the importance of being compliant with her medications daily and she agreed with same. I set out her evening and bedtime meds for her to take tonight in labeled medicine cups.   [place] will be seeing her PCP this Thursday at 11:00. She needs the following refills:  -Pantoprazole -Cymbalta -Clonazepam  I will see Rhonda [name] in one week in the home. She knows to reach out in the meantime if needed.   -BP meds were restarted after hospital stay per discharge- we will try to get her scheduled for HF clinic follow up soon.   Patient Care Team: [name], MD as PCP - General (Family Medicine) [name], LCSW as Social Worker (Licensed Clinical Social Worker) [name], [name], DO as Consulting Physician (Neurology) [name], EMT as Paramedic [name] [name], MD as Attending  Physician (Family Medicine)  Patient Active Problem List   Diagnosis Date Noted   Elevated troponin 12/02/2021   Nausea & vomiting 12/01/2021   Acute left ankle pain 10/11/2021   Intractable nausea and vomiting 10/08/2021   AKI (acute kidney injury) ([place]) 10/08/2021   Hyponatremia 10/08/2021   Decreased estrogen level 08/01/2021   Dementia ([place]) 08/01/2021   Frail elderly 08/01/2021   Hyperparathyroidism ([place]) 08/01/2021   Spondylolisthesis 08/01/2021   Varicose veins of bilateral lower extremities with other complications 40/34/7425   Lumbago without sciatica 04/30/2021   Abnormal gait 06/11/2020   Adult failure to thrive syndrome 06/11/2020   Allergic rhinitis 06/11/2020   Anxiety 06/11/2020   Asthma 06/11/2020   Benign intracranial hypertension 06/11/2020   Body mass index (BMI) 45.0-49.9, adult ([place]) 06/11/2020   Bowel incontinence 06/11/2020   Cholelithiasis 06/11/2020   Chronic sinusitis 06/11/2020   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   Daytime somnolence 06/11/2020   Diarrhea of presumed infectious origin 06/11/2020   Edema 06/11/2020   Family history of malignant neoplasm of gastrointestinal tract 06/11/2020   Gout 06/11/2020   History of fall 06/11/2020   History of infectious disease 06/11/2020   Insomnia 06/11/2020   Irregular bowel habits 06/11/2020   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   Lumbar spondylosis with myelopathy 06/11/2020   Macrocytosis 06/11/2020   Mild recurrent major depression ([place]) 06/11/2020   Personal history of colonic polyps  06/11/2020   Personal history of malignant neoplasm of breast 06/11/2020   Repeated falls 06/11/2020   Mixed anxiety and depressive disorder 06/11/2020   Irritable bowel syndrome 01/04/2020   Spinal stenosis of lumbar region 01/03/2020   Other symptoms and signs involving the musculoskeletal system 09/11/2019   Bilateral leg weakness 08/01/2019   Leg swelling 08/01/2019   Diabetes mellitus with neuropathy ([place]) 06/15/2019    Hypokalemia 11/08/2018   CKD (chronic kidney disease) stage 3, GFR 30-59 ml/min (HCC) 11/08/2018   Orthostatic hypotension 07/28/2017   SVD (spontaneous vaginal delivery)    Peripheral neuropathy    On home oxygen therapy    Migraines    Left bundle branch block    Hypothyroidism    Hypertension    Hyperlipidemia    Heart murmur    GERD (gastroesophageal reflux disease)    Exertional shortness of breath    Major depressive disorder    Back pain    Arthritis    Generalized anxiety disorder    Anemia    Chronic respiratory failure ([place]) 09/14/2013   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   Morbid obesity ([place]) 69/67/8938   Chronic systolic heart failure ([place]) 10/27/2012   Endometrial polyp 01/20/2012   Malignant tumor of breast ([place]) 03/26/2011   Unspecified vitamin D deficiency 03/26/2011    Current Outpatient Medications:    acetaminophen (TYLENOL) 500 MG tablet, Take 1,000 mg by mouth daily as needed (for pain and fever of 101 or greater)., Disp: , Rfl:    acetaZOLAMIDE (DIAMOX) 125 MG tablet, Take 1 tablet (125 mg total) by mouth daily., Disp: 90 tablet, Rfl: 3   carvedilol (COREG) 3.125 MG tablet, TAKE 1 TABLET (3.125 MG) BY MOUTH TWICE DAILY WITH MEALS (Patient taking differently: Take 3.125 mg by mouth 2 (two) times daily with a meal.), Disp: 180 tablet, Rfl: 3   citalopram (CELEXA) 20 MG tablet, Take 20 mg by mouth daily., Disp: , Rfl:    clonazePAM (KLONOPIN) 0.5 MG tablet, Take 0.5 mg by mouth 2 (two) times daily as needed for anxiety., Disp: , Rfl:    dicyclomine (BENTYL) 20 MG tablet, Take 20 mg by mouth 3 (three) times daily before meals., Disp: , Rfl:    gabapentin (NEURONTIN) 300 MG capsule, Take 300 mg by mouth 2 (two) times daily., Disp: , Rfl:    levothyroxine (SYNTHROID) 88 MCG tablet, TAKE 1 TABLET(88 MCG) BY MOUTH DAILY BEFORE BREAKFAST (Patient taking differently: Take 88 mcg by mouth daily before breakfast.), Disp: 90  tablet, Rfl: 3   metolazone (ZAROXOLYN) 2.5 MG tablet, Take 1 tablet by mouth every other Tuesday. (Patient taking differently: Take 2.5 mg by mouth See admin instructions. Take 1 tablet by mouth every other Tuesday.), Disp: 12 tablet, Rfl: 3   midodrine (PROAMATINE) 10 MG tablet, TAKE 1 TABLET THREE TIMES DAILY WITH MEALS (Patient taking differently: Take 10 mg by mouth 3 (three) times daily. TAKE 1 TABLET THREE TIMES DAILY WITH MEALS), Disp: 270 tablet, Rfl: 3   pantoprazole (PROTONIX) 40 MG tablet, Take 40 mg by mouth daily as needed (for acid reflux)., Disp: , Rfl:    potassium chloride SA (KLOR-CON M) 20 MEQ tablet, Take 3 tablets (60 mEq total) by mouth 3 (three) times daily., Disp: 572 tablet, Rfl: 0   simvastatin (ZOCOR) 10 MG tablet, Take 1 tablet (10 mg total) by mouth every evening., Disp: 30 tablet, Rfl: 11   spironolactone (ALDACTONE) 25 MG tablet, Take 1 tablet (25 mg total) by  mouth every evening. Needs appt for further refills (Patient not taking: Reported on 12/02/2021), Disp: 90 tablet, Rfl: 0   torsemide (DEMADEX) 20 MG tablet, Take 4 tablets (80 mg total) by mouth every morning AND 3 tablets (60 mg total) every evening. (Patient taking differently: Take 3 tablets (60 mg total) by mouth every morning AND 4 tablets (40 mg total) every evening.), Disp: 210 tablet, Rfl: 11 Allergies  Allergen Reactions   Ceftin Anaphylaxis    Face and throat swell    Geodon [Ziprasidone Hcl] Hives   Lisinopril Other (See Comments)    angioedema   Cefuroxime     Other reaction(s): anaphylaxis   Sulfacetamide Sodium-Sulfur     Other reaction(s): itch   Allopurinol Nausea Only and Other (See Comments)    weakness   Ativan [Lorazepam] Itching   Sulfa Antibiotics Itching   Valium [Diazepam] Other (See Comments)    Patient states that diazepam doesn't relax, it has the opposite effect.     Social History   Socioeconomic History   Marital status: Married    Spouse name: Not on file   Number  of children: 2   Years of education: 71   Highest education level: Master's degree (e.g., MA, MS, MEng, MEd, MSW, MBA)  Occupational History   Occupation: retired  Tobacco Use   Smoking status: Former    Packs/day: 0.10    Years: 26.00    Total pack years: 2.60    Types: Cigarettes    Quit date: 03/23/2021    Years since quitting: 0.7   Smokeless tobacco: Never  Vaping Use   Vaping Use: Never used  Substance and Sexual Activity   Alcohol use: No   Drug use: No   Sexual activity: Yes  Other Topics Concern   Not on file  Social History Narrative   Tobacco Use Cigarettes: Former Smoker, Quit in 2008   No Alcohol   No recreational drug use   Diet: Regular/Low Carb   Exercise: None   Occupation: disabled   Education: Research officer, political party, masters   Children: 2   Firearms: No   Therapist, art Use: 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      Social Determinants of Health   Financial Resource Strain: Not on file  Food Insecurity: Not on file  Transportation Needs: Not on file  Physical Activity: Not on file  Stress: Not on file  Social Connections: Not on file  Intimate Partner Violence: Not on file    Physical Exam      Future Appointments  Date Time Provider [place]  12/10/2021  7:15 AM [facility] DEVICE REMOTES [facility]  12/22/2021  7:30 AM [facility] DEVICE REMOTES [facility]  12/24/2021 11:00 AM Conception Chancy, PsyD [facility]  12/25/2021  2:30 PM [facility] PA/NP [facility] None  01/06/2022 11:30 AM Reed, Tiffany L, DO ACP-[facility] None  03/04/2022  2:30 PM Marzetta Board, DPM [facility]  03/11/2022  7:15 AM [facility] DEVICE [facility]OTES [facility]  04/01/2022  1:00 PM Hazle Coca, PhD [facility] None  04/01/2022  2:00 PM LBN- NEUROPSYCH TECH [facility] None  04/08/2022  2:30 PM Hazle Coca, PhD [facility] None  04/15/2022  8:50 AM Narda Amber K, DO [facility] None  06/10/2022  7:15 AM  [facility] DEVICE REMOTES [facility]  09/09/2022  7:15 AM [facility] DEVICE REMOTES [facility]  12/09/2022  7:15 AM [facility] DEVICE [facility]ES [facility]     ACTION: Home  visit completed

## 2021-12-10 ENCOUNTER — Ambulatory Visit (INDEPENDENT_AMBULATORY_CARE_PROVIDER_SITE_OTHER): Payer: Medicare HMO

## 2021-12-10 DIAGNOSIS — I428 Other cardiomyopathies: Secondary | ICD-10-CM | POA: Diagnosis not present

## 2021-12-11 DIAGNOSIS — G932 Benign intracranial hypertension: Secondary | ICD-10-CM | POA: Diagnosis not present

## 2021-12-11 DIAGNOSIS — F339 Major depressive disorder, recurrent, unspecified: Secondary | ICD-10-CM | POA: Diagnosis not present

## 2021-12-11 DIAGNOSIS — R7309 Other abnormal glucose: Secondary | ICD-10-CM | POA: Diagnosis not present

## 2021-12-11 LAB — CUP PACEART REMOTE DEVICE CHECK
Battery Remaining Longevity: 73 mo
Battery Voltage: 2.99 V
Brady Statistic AP VP Percent: 0.08 %
Brady Statistic AP VS Percent: 0.01 %
Brady Statistic AS VP Percent: 98.84 %
Brady Statistic AS VS Percent: 1.07 %
Brady Statistic RA Percent Paced: 0.09 %
Brady Statistic RV Percent Paced: 97.41 %
Date Time Interrogation Session: 20230719022823
HighPow Impedance: 71 Ohm
Implantable Lead Implant Date: 20141210
Implantable Lead Implant Date: 20141210
Implantable Lead Implant Date: 20141210
Implantable Lead Location: 753858
Implantable Lead Location: 753859
Implantable Lead Location: 753860
Implantable Lead Model: 4396
Implantable Lead Model: 5076
Implantable Lead Model: 6935
Implantable Pulse Generator Implant Date: 20211118
Lead Channel Impedance Value: 342 Ohm
Lead Channel Impedance Value: 342 Ohm
Lead Channel Impedance Value: 456 Ohm
Lead Channel Impedance Value: 532 Ohm
Lead Channel Impedance Value: 589 Ohm
Lead Channel Impedance Value: 608 Ohm
Lead Channel Pacing Threshold Amplitude: 0.5 V
Lead Channel Pacing Threshold Amplitude: 1 V
Lead Channel Pacing Threshold Amplitude: 1.25 V
Lead Channel Pacing Threshold Pulse Width: 0.4 ms
Lead Channel Pacing Threshold Pulse Width: 0.4 ms
Lead Channel Pacing Threshold Pulse Width: 0.4 ms
Lead Channel Sensing Intrinsic Amplitude: 26.125 mV
Lead Channel Sensing Intrinsic Amplitude: 26.125 mV
Lead Channel Sensing Intrinsic Amplitude: 3.375 mV
Lead Channel Sensing Intrinsic Amplitude: 3.375 mV
Lead Channel Setting Pacing Amplitude: 1.5 V
Lead Channel Setting Pacing Amplitude: 2 V
Lead Channel Setting Pacing Amplitude: 2.5 V
Lead Channel Setting Pacing Pulse Width: 0.4 ms
Lead Channel Setting Pacing Pulse Width: 0.4 ms
Lead Channel Setting Sensing Sensitivity: 0.3 mV

## 2021-12-15 ENCOUNTER — Other Ambulatory Visit (HOSPITAL_COMMUNITY): Payer: Self-pay

## 2021-12-15 NOTE — Progress Notes (Signed)
Paramedicine Encounter    Patient ID: Rhonda [name], female    DOB: [birth year], 71 y.o.   MRN: [id number]   Arrived for home visit for Rhonda [name] who reports feeling good today but complains of left foot and ankle swelling with pain upon ambulating. She denied any recent injury or falls. Left ankle does appear swollen but no bruising or deformity. I encouraged her to rest it, elevate it and ice it. She agreed with this plan. No lower leg pitting edema noted. Lungs clear. Vitals as noted:  WT- 208lbs BP- 128/60 HR- 78 O2- 97% RR- 16   She has been better about med compliance this week- she reports her husband has been helping her with giving her, her medications out of the pill box during the day. This is helping with her compliance and she reports feeling better too!   I contacted her PCP office as she had a visit with them last week. No med changes per PCP. I requested refills for her to send into [facility]. -Clonazepam -Pantoprazole   RN Enid Derry reports she will send same to [facility].   I reviewed meds and filled pill box for one week.   Rhonda [name] is scheduled to see [facility] on Thursday at 3:00   PCP follow up scheduled for October 18th at 1:00  I will see [name] in one week. She knows to call me if needed. Home visit complete.   [name], EMT-Paramedic [phone] 12/15/2021       Patient Care Team: [name], MD as PCP - General (Family Medicine) [name], LCSW as Social Worker (Licensed Clinical Social Worker) [name], [name], DO as Consulting Physician (Neurology) [name], EMT as Paramedic [name] [name], MD as Attending Physician (Family Medicine)  Patient Active Problem List   Diagnosis Date Noted   Elevated troponin 12/02/2021   Nausea & vomiting 12/01/2021   Acute left ankle pain 10/11/2021   Intractable nausea and vomiting 10/08/2021   AKI (acute kidney injury) ([place]) 10/08/2021   Hyponatremia 10/08/2021    Decreased estrogen level 08/01/2021   Dementia ([place]) 08/01/2021   Frail elderly 08/01/2021   Hyperparathyroidism ([place]) 08/01/2021   Spondylolisthesis 08/01/2021   Varicose veins of bilateral lower extremities with other complications 62/70/3500   Lumbago without sciatica 04/30/2021   Abnormal gait 06/11/2020   Adult failure to thrive syndrome 06/11/2020   Allergic rhinitis 06/11/2020   Anxiety 06/11/2020   Asthma 06/11/2020   Benign intracranial hypertension 06/11/2020   Body mass index (BMI) 45.0-49.9, adult ([place]) 06/11/2020   Bowel incontinence 06/11/2020   Cholelithiasis 06/11/2020   Chronic sinusitis 06/11/2020   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   Daytime somnolence 06/11/2020   Diarrhea of presumed infectious origin 06/11/2020   Edema 06/11/2020   Family history of malignant neoplasm of gastrointestinal tract 06/11/2020   Gout 06/11/2020   History of fall 06/11/2020   History of infectious disease 06/11/2020   Insomnia 06/11/2020   Irregular bowel habits 06/11/2020   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   Lumbar spondylosis with myelopathy 06/11/2020   Macrocytosis 06/11/2020   Mild recurrent major depression ([place]) 06/11/2020   Personal history of colonic polyps 06/11/2020   Personal history of malignant neoplasm of breast 06/11/2020   Repeated falls 06/11/2020   Mixed anxiety and depressive disorder 06/11/2020   Irritable bowel syndrome 01/04/2020   Spinal stenosis of lumbar region 01/03/2020   Other symptoms and signs involving the musculoskeletal system 09/11/2019   Bilateral leg weakness 08/01/2019   Leg swelling  08/01/2019   Diabetes mellitus with neuropathy ([place]) 06/15/2019   Hypokalemia 11/08/2018   CKD (chronic kidney disease) stage 3, GFR 30-59 ml/min (HCC) 11/08/2018   Orthostatic hypotension 07/28/2017   SVD (spontaneous vaginal delivery)    Peripheral neuropathy    On home oxygen therapy    Migraines    Left bundle branch block    Hypothyroidism    Hypertension     Hyperlipidemia    Heart murmur    GERD (gastroesophageal reflux disease)    Exertional shortness of breath    Major depressive disorder    Back pain    Arthritis    Generalized anxiety disorder    Anemia    Chronic respiratory failure ([place]) 09/14/2013   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   Morbid obesity ([place]) 26/71/2458   Chronic systolic heart failure ([place]) 10/27/2012   Endometrial polyp 01/20/2012   Malignant tumor of breast ([place]) 03/26/2011   Unspecified vitamin D deficiency 03/26/2011    Current Outpatient Medications:    acetaminophen (TYLENOL) 500 MG tablet, Take 1,000 mg by mouth daily as needed (for pain and fever of 101 or greater)., Disp: , Rfl:    acetaZOLAMIDE (DIAMOX) 125 MG tablet, Take 1 tablet (125 mg total) by mouth daily., Disp: 90 tablet, Rfl: 3   carvedilol (COREG) 3.125 MG tablet, TAKE 1 TABLET (3.125 MG) BY MOUTH TWICE DAILY WITH MEALS, Disp: 180 tablet, Rfl: 3   citalopram (CELEXA) 20 MG tablet, Take 20 mg by mouth daily., Disp: , Rfl:    clonazePAM (KLONOPIN) 0.5 MG tablet, Take 0.5 mg by mouth 2 (two) times daily as needed for anxiety., Disp: , Rfl:    dicyclomine (BENTYL) 20 MG tablet, Take 20 mg by mouth 3 (three) times daily before meals., Disp: , Rfl:    gabapentin (NEURONTIN) 300 MG capsule, Take 300 mg by mouth 2 (two) times daily., Disp: , Rfl:    levothyroxine (SYNTHROID) 88 MCG tablet, TAKE 1 TABLET(88 MCG) BY MOUTH DAILY BEFORE BREAKFAST, Disp: 90 tablet, Rfl: 3   metolazone (ZAROXOLYN) 2.5 MG tablet, Take 1 tablet by mouth every other Tuesday., Disp: 12 tablet, Rfl: 3   midodrine (PROAMATINE) 10 MG tablet, TAKE 1 TABLET THREE TIMES DAILY WITH MEALS (Patient taking differently: Take 10 mg by mouth 3 (three) times daily. TAKE 1 TABLET THREE TIMES DAILY WITH MEALS), Disp: 270 tablet, Rfl: 3   pantoprazole (PROTONIX) 40 MG tablet, Take 40 mg by mouth daily as needed (for acid reflux)., Disp: , Rfl:    potassium  chloride SA (KLOR-CON M) 20 MEQ tablet, Take 3 tablets (60 mEq total) by mouth 3 (three) times daily., Disp: 572 tablet, Rfl: 0   simvastatin (ZOCOR) 10 MG tablet, Take 1 tablet (10 mg total) by mouth every evening., Disp: 30 tablet, Rfl: 11   spironolactone (ALDACTONE) 25 MG tablet, Take 1 tablet (25 mg total) by mouth every evening. Needs appt for further refills, Disp: 90 tablet, Rfl: 0   torsemide (DEMADEX) 20 MG tablet, Take 4 tablets (80 mg total) by mouth every morning AND 3 tablets (60 mg total) every evening., Disp: 210 tablet, Rfl: 11 Allergies  Allergen Reactions   Ceftin Anaphylaxis    Face and throat swell    Geodon [Ziprasidone Hcl] Hives   Lisinopril Other (See Comments)    angioedema   Cefuroxime     Other reaction(s): anaphylaxis   Sulfacetamide Sodium-Sulfur     Other reaction(s): itch   Allopurinol Nausea Only and Other (See  Comments)    weakness   Ativan [Lorazepam] Itching   Sulfa Antibiotics Itching   Valium [Diazepam] Other (See Comments)    Patient states that diazepam doesn't relax, it has the opposite effect.     Social History   Socioeconomic History   Marital status: Married    Spouse name: Not on file   Number of children: 2   Years of education: 62   Highest education level: Master's degree (e.g., MA, MS, MEng, MEd, MSW, MBA)  Occupational History   Occupation: retired  Tobacco Use   Smoking status: Former    Packs/day: 0.10    Years: 26.00    Total pack years: 2.60    Types: Cigarettes    Quit date: 03/23/2021    Years since quitting: 0.7   Smokeless tobacco: Never  Vaping Use   Vaping Use: Never used  Substance and Sexual Activity   Alcohol use: No   Drug use: No   Sexual activity: Yes  Other Topics Concern   Not on file  Social History Narrative   Tobacco Use Cigarettes: Former Smoker, Quit in 2008   No Alcohol   No recreational drug use   Diet: Regular/Low Carb   Exercise: None   Occupation: disabled   Education: Gaffer, masters   Children: 2   Firearms: No   Therapist, art Use: 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      Social Determinants of Health   Financial Resource Strain: Not on file  Food Insecurity: Not on file  Transportation Needs: Not on file  Physical Activity: Not on file  Stress: Not on file  Social Connections: Not on file  Intimate Partner Violence: Not on file    Physical Exam      Future Appointments  Date Time Provider [place]  12/22/2021  7:30 AM [facility] DEVICE [facility]OT[facility]  12/24/2021 11:00 AM Conception Chancy, PsyD [facility]  01/06/2022 11:30 AM [name], Tiffany L, DO ACP-ACP None  01/07/2022  2:30 PM [facility] PA/NP [facility] None  03/04/2022  2:30 PM Marzetta Board, DPM [facility]  03/11/2022  7:15 AM [facility] DEVICE REMOTES [facility]  04/01/2022  1:00 PM Hazle Coca, PhD [facility] None  04/01/2022  2:00 PM LBN- [place] [facility] None  04/08/2022  2:30 PM Hazle Coca, PhD [facility] None  04/15/2022  8:50 AM Narda Amber K, DO [facility] None  06/10/2022  7:15 AM [facility] DEVICE REMOTES [facility]  09/09/2022  7:15 AM [facility] DEVICE [facility]OTES [facility]  12/09/2022  7:15 AM [facility] DEVICE REMOTES [facility]     ACTION: Home visit completed

## 2021-12-17 ENCOUNTER — Telehealth (HOSPITAL_COMMUNITY): Payer: Self-pay

## 2021-12-17 ENCOUNTER — Telehealth: Payer: Self-pay | Admitting: Neurology

## 2021-12-17 NOTE — Telephone Encounter (Signed)
Called [place] from [place] and informed her of recommendations by Dr. Delice Lesch. Rhonda [name] verbalized understanding and had no further questions or concerns.

## 2021-12-17 NOTE — Telephone Encounter (Signed)
Enid Derry- RN with PCP called me to update that Neurologist does not want to stop Diamox for now until she has an eye exam and a neuro follow up so she will continue Diamox. Noted- call complete.   [name], [place] 12/17/2021

## 2021-12-17 NOTE — Telephone Encounter (Signed)
It is a low dose, she can try stopping low dose Diamox, but strongly recommend she see an eye doctor to make sure no increased pressure on eye exam (papilledema). Thanks

## 2021-12-17 NOTE — Telephone Encounter (Signed)
Received call from PCP- Dr. Rosario Jacks RN who reports some med changed for Rhonda [name]. (Note they are affiliated with [facility] [place] and unable to access Epic for these changes- I will place note in med list at next visit)  -Stop Cymbalta -Continue Celexa '20mg'$  daily -Decrease Clonazepam 1/2 tab as needed with goal to stop next month.   -possibly stopping diamox pending Neuro consult. (Will continue to follow up)  [name], [place] [phone] 12/17/2021

## 2021-12-17 NOTE — Telephone Encounter (Signed)
Rhonda [name] with [facility] called in stating Dr. [name] is wanting to see if Dr. [name] is ok with stopping or weaning off the pt's Diamox? The pt is not having as many headaches. Dr. [name] is wanting to reduce the amount of medications the patient is taking due to the pt having several side effects. She also wanted Dr. [name] to know the pt's opening pressure was mildly elevated in the past.

## 2021-12-18 DIAGNOSIS — Z79899 Other long term (current) drug therapy: Secondary | ICD-10-CM | POA: Diagnosis not present

## 2021-12-18 DIAGNOSIS — N1832 Chronic kidney disease, stage 3b: Secondary | ICD-10-CM | POA: Diagnosis not present

## 2021-12-18 DIAGNOSIS — I509 Heart failure, unspecified: Secondary | ICD-10-CM | POA: Diagnosis not present

## 2021-12-18 DIAGNOSIS — I951 Orthostatic hypotension: Secondary | ICD-10-CM | POA: Diagnosis not present

## 2021-12-22 ENCOUNTER — Other Ambulatory Visit (HOSPITAL_COMMUNITY): Payer: Self-pay

## 2021-12-22 ENCOUNTER — Ambulatory Visit (INDEPENDENT_AMBULATORY_CARE_PROVIDER_SITE_OTHER): Payer: Medicare HMO

## 2021-12-22 DIAGNOSIS — I5022 Chronic systolic (congestive) heart failure: Secondary | ICD-10-CM

## 2021-12-22 DIAGNOSIS — Z95810 Presence of automatic (implantable) cardiac defibrillator: Secondary | ICD-10-CM

## 2021-12-22 NOTE — Progress Notes (Signed)
Paramedicine Encounter    Patient ID: Rhonda [name], female    DOB: [birth year], 71 y.o.   MRN: [id number]   Arrived for home visit for Rhonda [name] who reports feeling good today other than having intermittent headaches with some dizziness. She reports that she has been compliant with her medications over the last week and has been feeling okay. She denied shortness of breath, swelling, chest pain. I obtained vitals and all were within normal limits. Negative on orthostatics. She says she thinks she got dizzy because she stood up too quickly and has not eaten yet today.   BP- 120/68 HR- 89 RR- 16 O2- 97% WT- 199lbs  No lower leg swelling noted, no abdominal distention or JVD. Lungs clear.   She has been compliant with all meds over the last week. I gave her, her before breakfast meds at the start of our visit. She ate some breakfast and I gave her, her morning meds at the end of our visit.   We reviewed upcoming appointments and confirmed same. I wrote them out on paper and taped them to her calender also writing them on the calendar.   Her husband was made aware of same.   Pill box filled for one week. No refills needed at this time.   Med list updated with changes from PCP.  -1/2 tablet daily as needed of Clonazepam    I plan to see Redith in one week, she agreed with plan. Home visit complete.      Patient Care Team: [name], MD as PCP - General (Family Medicine) [name], LCSW as Social Worker (Licensed Clinical Social Worker) [name], [name], DO as Consulting Physician (Neurology) [name], EMT as Paramedic [name] [name], MD as Attending Physician (Family Medicine)  Patient Active Problem List   Diagnosis Date Noted   Elevated troponin 12/02/2021   Nausea & vomiting 12/01/2021   Acute left ankle pain 10/11/2021   Intractable nausea and vomiting 10/08/2021   AKI (acute kidney injury) ([place]) 10/08/2021   Hyponatremia 10/08/2021    Decreased estrogen level 08/01/2021   Dementia ([place]) 08/01/2021   Frail elderly 08/01/2021   Hyperparathyroidism ([place]) 08/01/2021   Spondylolisthesis 08/01/2021   Varicose veins of bilateral lower extremities with other complications 51/06/5850   Lumbago without sciatica 04/30/2021   Abnormal gait 06/11/2020   Adult failure to thrive syndrome 06/11/2020   Allergic rhinitis 06/11/2020   Anxiety 06/11/2020   Asthma 06/11/2020   Benign intracranial hypertension 06/11/2020   Body mass index (BMI) 45.0-49.9, adult ([place]) 06/11/2020   Bowel incontinence 06/11/2020   Cholelithiasis 06/11/2020   Chronic sinusitis 06/11/2020   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   Daytime somnolence 06/11/2020   Diarrhea of presumed infectious origin 06/11/2020   Edema 06/11/2020   Family history of malignant neoplasm of gastrointestinal tract 06/11/2020   Gout 06/11/2020   History of fall 06/11/2020   History of infectious disease 06/11/2020   Insomnia 06/11/2020   Irregular bowel habits 06/11/2020   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   Lumbar spondylosis with myelopathy 06/11/2020   Macrocytosis 06/11/2020   Mild recurrent major depression ([place]) 06/11/2020   Personal history of colonic polyps 06/11/2020   Personal history of malignant neoplasm of breast 06/11/2020   Repeated falls 06/11/2020   Mixed anxiety and depressive disorder 06/11/2020   Irritable bowel syndrome 01/04/2020   Spinal stenosis of lumbar region 01/03/2020   Other symptoms and signs involving the musculoskeletal system 09/11/2019   Bilateral leg weakness 08/01/2019  Leg swelling 08/01/2019   Diabetes mellitus with neuropathy ([place]) 06/15/2019   Hypokalemia 11/08/2018   CKD (chronic kidney disease) stage 3, GFR 30-59 ml/min (HCC) 11/08/2018   Orthostatic hypotension 07/28/2017   SVD (spontaneous vaginal delivery)    Peripheral neuropathy    On home oxygen therapy    Migraines    Left bundle branch block    Hypothyroidism    Hypertension     Hyperlipidemia    Heart murmur    GERD (gastroesophageal reflux disease)    Exertional shortness of breath    Major depressive disorder    Back pain    Arthritis    Generalized anxiety disorder    Anemia    Chronic respiratory failure ([place]) 09/14/2013   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   Morbid obesity ([place]) 20/25/4270   Chronic systolic heart failure ([place]) 10/27/2012   Endometrial polyp 01/20/2012   Malignant tumor of breast ([place]) 03/26/2011   Unspecified vitamin D deficiency 03/26/2011    Current Outpatient Medications:    acetaminophen (TYLENOL) 500 MG tablet, Take 1,000 mg by mouth daily as needed (for pain and fever of 101 or greater)., Disp: , Rfl:    acetaZOLAMIDE (DIAMOX) 125 MG tablet, Take 1 tablet (125 mg total) by mouth daily., Disp: 90 tablet, Rfl: 3   carvedilol (COREG) 3.125 MG tablet, TAKE 1 TABLET (3.125 MG) BY MOUTH TWICE DAILY WITH MEALS, Disp: 180 tablet, Rfl: 3   citalopram (CELEXA) 20 MG tablet, Take 20 mg by mouth daily., Disp: , Rfl:    clonazePAM (KLONOPIN) 0.5 MG tablet, Take 0.5 mg by mouth 2 (two) times daily as needed for anxiety., Disp: , Rfl:    dicyclomine (BENTYL) 20 MG tablet, Take 20 mg by mouth 3 (three) times daily before meals., Disp: , Rfl:    gabapentin (NEURONTIN) 300 MG capsule, Take 300 mg by mouth 2 (two) times daily., Disp: , Rfl:    levothyroxine (SYNTHROID) 88 MCG tablet, TAKE 1 TABLET(88 MCG) BY MOUTH DAILY BEFORE BREAKFAST, Disp: 90 tablet, Rfl: 3   metolazone (ZAROXOLYN) 2.5 MG tablet, Take 1 tablet by mouth every other Tuesday., Disp: 12 tablet, Rfl: 3   midodrine (PROAMATINE) 10 MG tablet, TAKE 1 TABLET THREE TIMES DAILY WITH MEALS, Disp: 270 tablet, Rfl: 3   pantoprazole (PROTONIX) 40 MG tablet, Take 40 mg by mouth daily as needed (for acid reflux)., Disp: , Rfl:    potassium chloride SA (KLOR-CON M) 20 MEQ tablet, Take 3 tablets (60 mEq total) by mouth 3 (three) times daily., Disp: 572  tablet, Rfl: 0   simvastatin (ZOCOR) 10 MG tablet, Take 1 tablet (10 mg total) by mouth every evening., Disp: 30 tablet, Rfl: 11   spironolactone (ALDACTONE) 25 MG tablet, Take 1 tablet (25 mg total) by mouth every evening. Needs appt for further refills, Disp: 90 tablet, Rfl: 0   torsemide (DEMADEX) 20 MG tablet, Take 4 tablets (80 mg total) by mouth every morning AND 3 tablets (60 mg total) every evening., Disp: 210 tablet, Rfl: 11 Allergies  Allergen Reactions   Ceftin Anaphylaxis    Face and throat swell    Geodon [Ziprasidone Hcl] Hives   Lisinopril Other (See Comments)    angioedema   Cefuroxime     Other reaction(s): anaphylaxis   Sulfacetamide Sodium-Sulfur     Other reaction(s): itch   Allopurinol Nausea Only and Other (See Comments)    weakness   Ativan [Lorazepam] Itching   Sulfa Antibiotics Itching   Valium [  Diazepam] Other (See Comments)    Patient states that diazepam doesn't relax, it has the opposite effect.     Social History   Socioeconomic History   Marital status: Married    Spouse name: Not on file   Number of children: 2   Years of education: 67   Highest education level: Master's degree (e.g., MA, MS, MEng, MEd, MSW, MBA)  Occupational History   Occupation: retired  Tobacco Use   Smoking status: Former    Packs/day: 0.10    Years: 26.00    Total pack years: 2.60    Types: Cigarettes    Quit date: 03/23/2021    Years since quitting: 0.7   Smokeless tobacco: Never  Vaping Use   Vaping Use: Never used  Substance and Sexual Activity   Alcohol use: No   Drug use: No   Sexual activity: Yes  Other Topics Concern   Not on file  Social History Narrative   Tobacco Use Cigarettes: Former Smoker, Quit in 2008   No Alcohol   No recreational drug use   Diet: Regular/Low Carb   Exercise: None   Occupation: disabled   Education: Research officer, political party, masters   Children: 2   Firearms: No   Therapist, art Use: Always   Former Metallurgist.    Right handed   Two  story home      Social Determinants of Health   Financial Resource Strain: Not on file  Food Insecurity: Not on file  Transportation Needs: Not on file  Physical Activity: Not on file  Stress: Not on file  Social Connections: Not on file  Intimate Partner Violence: Not on file    Physical Exam      Future Appointments  Date Time Provider [place]  12/24/2021 11:00 AM Conception Chancy, PsyD [facility]  01/06/2022 11:30 AM Hollace Kinnier L, DO [facility] None  01/07/2022  2:30 PM [facility] PA/NP [facility] None  03/04/2022  2:30 PM Marzetta Board, DPM [facility]  03/11/2022  7:15 AM [facility] DEVICE REMOTES [facility]  04/01/2022  1:00 PM Hazle Coca, PhD [facility] None  04/01/2022  2:00 PM LBN- NEUROPSYCH TECH [facility] None  04/08/2022  2:30 PM Hazle Coca, PhD [facility] None  04/15/2022  8:50 AM Narda Amber K, DO [facility] None  06/10/2022  7:15 AM [facility] DEVICE REMOTES [facility]  09/09/2022  7:15 AM [facility] DEVICE REMOTES [facility]  12/09/2022  7:15 AM [facility] DEVICE REMOTES [facility]     ACTION: Home visit completed

## 2021-12-24 ENCOUNTER — Ambulatory Visit (INDEPENDENT_AMBULATORY_CARE_PROVIDER_SITE_OTHER): Payer: Medicare HMO | Admitting: Psychologist

## 2021-12-24 DIAGNOSIS — F330 Major depressive disorder, recurrent, mild: Secondary | ICD-10-CM | POA: Diagnosis not present

## 2021-12-24 NOTE — Progress Notes (Signed)
                Jacquline Terrill, PsyD 

## 2021-12-24 NOTE — Progress Notes (Signed)
[facility] Counselor Initial Adult Exam  Name: Rhonda [name] Date: 12/24/2021 MRN: [id number] DOB: Feb 28, 1951 PCP: [name], MD  Time spent: 11:05 am to 11:37 am; total time: 32 minutes  This session was held via in person. The patient consented to in-person therapy and was in the clinician's office. Limits of confidentiality were discussed with the patient.   Guardian/Payee:  NA    Paperwork requested: No   Reason for Visit /Presenting Problem: Depression  Mental Status Exam: Appearance:   Well Groomed     Behavior:  Appropriate  Motor:  Normal  Speech/Language:   Clear and Coherent  Affect:  Appropriate  Mood:  normal  Thought process:  normal  Thought content:    WNL  Sensory/Perceptual disturbances:    WNL  Orientation:  oriented to person, place, time/date, and situation  Attention:  Good  Concentration:  Good  Memory:  WNL  Fund of knowledge:   Fair  Insight:    Fair  Judgment:   Good  Impulse Control:  Good   Reported Symptoms:  The patient endorsed experiencing the following: feeling sad, down, social isolation, avoiding pleasurable activities, rumination of thoughts, fatigue, lack of motivation, and low self-esteem. She denied suicidal and homicidal ideation.   Risk Assessment: Danger to Self:  No Self-injurious Behavior: No Danger to Others: No Duty to Warn:no Physical Aggression / Violence:No  Access to Firearms a concern: No  Gang Involvement:No  Patient / guardian was educated about steps to take if suicide or homicide risk level increases between visits: n/a While future psychiatric events cannot be accurately predicted, the patient does not currently require acute inpatient psychiatric care and does not currently meet [facility] involuntary commitment criteria.  Substance Abuse History: Current substance abuse: No     Past Psychiatric History:   Previous psychological history is significant for depression Outpatient  Providers:NA History of Psych Hospitalization: No  Psychological Testing:  NA    Abuse History:  Victim of: Yes.  , emotional   Report needed: No. Victim of Neglect:No. Perpetrator of  NA   Witness / Exposure to Domestic Violence: No   Protective Services Involvement: No  Witness to [employer] Violence:  No   Family History:  Family History  Problem Relation Age of Onset   Alzheimer's disease Mother    CVA Mother    Hypertension Mother    Cancer - Colon Father        late 27   Birth defects Paternal Uncle     Living situation: the patient lives with their family  Sexual Orientation: Straight  Relationship Status: married  Name of spouse / other:Ed If a parent, number of children / ages:Patient has a 25 year old daughter and a 44 year old daughter.   Support Systems: spouse  Museum/gallery curator Stress:  No   Income/Employment/Disability: Actor: No   Educational History: Education: post Forensic psychologist work or degree  Religion/Sprituality/World View: Christian/[employer]ist  Any cultural differences that may affect / interfere with treatment:  not applicable   Recreation/Hobbies: Playing with grandchildren  Stressors: Other: health and youngest daughter    Strengths: Supportive Relationships  Barriers:  NA   Legal History: Pending legal issue / charges: The patient has no significant history of legal issues. 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    Complication of anesthesia    History of low blood pressure after surgery; attributed to lying flat   Diabetes mellitus    "diet controlled" (05/03/2013)   Exertional shortness of breath    Generalized anxiety disorder    GERD (gastroesophageal reflux disease)    Gout    Heart murmur    Hyperlipidemia    Hypertension    Hypokalemia 11/08/2018   Hypothyroidism    Left bundle branch block    s/p CRT-D (04/2013)   Major depressive disorder    Migraines    On home oxygen therapy    "2L suppose to be q night" (05/03/2013)   Orthostatic hypotension 07/28/2017   Peripheral neuropathy    Feet   SVD (spontaneous vaginal delivery)    x 2   Unspecified vitamin D deficiency 03/26/2011   Does not take meds    Past Surgical History:  Procedure Laterality Date   BI-VENTRICULAR IMPLANTABLE CARDIOVERTER DEFIBRILLATOR N/A 05/03/2013   Procedure: BI-VENTRICULAR IMPLANTABLE CARDIOVERTER DEFIBRILLATOR  (CRT-D);  Surgeon: Evans Lance, MD;  [facility] CATH LAB;  Service: Cardiovascular;  Laterality: N/A;   BI-VENTRICULAR IMPLANTABLE CARDIOVERTER DEFIBRILLATOR  (CRT-D)  05/03/2013   MDT CRTD implanted by Dr Lovena Le for non ischemic cardiomyopathy   BIOPSY  05/31/2018   Procedure: BIOPSY;  Surgeon: Ronnette Juniper, MD;  Location: [facility] ENDOSCOPY;  Service: Gastroenterology;;   [place] N/A 04/11/2020   Procedure: [place];  Surgeon: Evans Lance, MD;  Location: [place] CV LAB;  Service: Cardiovascular;  Laterality: N/A;   BREAST LUMPECTOMY Left 07/28/2010   CARDIAC CATHETERIZATION  2010   NORMAL CORONARY ARTERIES   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--11 SURGERIES     PT HAS REMOVABLE SPEECH BULB-TAKES IT OUT BEFORE HER SURGERY   COLONOSCOPY     COLONOSCOPY N/A 05/31/2018   Procedure: COLONOSCOPY;  Surgeon: Ronnette Juniper, MD;  Location: [facility] ENDOSCOPY;  Service: Gastroenterology;  Laterality:  N/A;   HYSTEROSCOPY WITH D & C  01/20/2012   Procedure: DILATATION AND CURETTAGE /HYSTEROSCOPY;  Surgeon: Margarette Asal, MD;  Location: [facility];  Service: Gynecology;  Laterality: N/A;  with trueclear   PORT-A-CATH REMOVAL  09/23/2011   Procedure: REMOVAL PORT-A-CATH;  Surgeon: Rolm Bookbinder, MD;  Location: [facility] ORS;  Service: General;  Laterality: N/A;  Port Removal   PORTACATH PLACEMENT  2012   TONSILLECTOMY  1960's   TOTAL KNEE ARTHROPLASTY  05/10/2012   Procedure: TOTAL KNEE ARTHROPLASTY;  Surgeon: Mauri Pole, MD;  Location: [facility] ORS;  Service: Orthopedics;  Laterality: Right;   WISDOM TOOTH EXTRACTION      Medications: Current Outpatient Medications  Medication Sig Dispense Refill   acetaminophen (TYLENOL) 500 MG tablet Take 1,000 mg by mouth daily as needed (for pain and fever of 101 or greater).     acetaZOLAMIDE (DIAMOX) 125 MG tablet Take 1 tablet (125 mg total) by mouth daily. 90 tablet 3   carvedilol (COREG) 3.125 MG tablet TAKE 1 TABLET (3.125  MG) BY MOUTH TWICE DAILY WITH MEALS 180 tablet 3   citalopram (CELEXA) 20 MG tablet Take 20 mg by mouth daily.     clonazePAM (KLONOPIN) 0.5 MG tablet Take 0.5 mg by mouth 2 (two) times daily as needed for anxiety.     dicyclomine (BENTYL) 20 MG tablet Take 20 mg by mouth 3 (three) times daily before meals.     gabapentin (NEURONTIN) 300 MG capsule Take 300 mg by mouth 2 (two) times daily.     levothyroxine (SYNTHROID) 88 MCG tablet TAKE 1 TABLET(88 MCG) BY MOUTH DAILY BEFORE BREAKFAST 90 tablet 3   metolazone (ZAROXOLYN) 2.5 MG tablet Take 1 tablet by mouth every other Tuesday. 12 tablet 3   midodrine (PROAMATINE) 10 MG tablet TAKE 1 TABLET THREE TIMES DAILY WITH MEALS 270 tablet 3   pantoprazole (PROTONIX) 40 MG tablet Take 40 mg by mouth daily as needed (for acid reflux). (Patient not taking: Reported on 12/22/2021)     potassium chloride SA (KLOR-CON M) 20 MEQ tablet Take 3 tablets (60 mEq total) by mouth 3 (three) times daily. 572  tablet 0   simvastatin (ZOCOR) 10 MG tablet Take 1 tablet (10 mg total) by mouth every evening. 30 tablet 11   spironolactone (ALDACTONE) 25 MG tablet Take 1 tablet (25 mg total) by mouth every evening. Needs appt for further refills 90 tablet 0   torsemide (DEMADEX) 20 MG tablet Take 4 tablets (80 mg total) by mouth every morning AND 3 tablets (60 mg total) every evening. 210 tablet 11   No current facility-administered medications for this visit.    Allergies  Allergen Reactions   Ceftin Anaphylaxis    Face and throat swell    Geodon [Ziprasidone Hcl] Hives   Lisinopril Other (See Comments)    angioedema   Cefuroxime     Other reaction(s): anaphylaxis   Sulfacetamide Sodium-Sulfur     Other reaction(s): itch   Allopurinol Nausea Only and Other (See Comments)    weakness   Ativan [Lorazepam] Itching   Sulfa Antibiotics Itching   Valium [Diazepam] Other (See Comments)    Patient states that diazepam doesn't relax, it has the opposite effect.    Diagnoses:  F33.0 major depressive affective disorder, recurrent, mild  Plan of Care:   The patient is a 71 year old Black woman who was referred due to experiencing depression. The patient lives at home with her husband. The patient meets criteria for a diagnosis of F33.0 major depressive affective disorder, recurrent, mild based off of the following: feeling sad, down, social isolation, avoiding pleasurable activities, rumination of thoughts, fatigue, lack of motivation, and low self-esteem. She denied suicidal and homicidal ideation.   The patient stated that she wants a place to process thoughts, emotions, and to vent.   This psychologist makes the recommendation that the patient participate in therapy at least once a month. If needed possibly bi-weekly   Conception Chancy, PsyD

## 2021-12-24 NOTE — Plan of Care (Signed)
Goals °Work through the grieving process and face reality of own death °Accept emotional support from others around them °Live life to the fullest, event though time may be limited °Become as knowledgeable about the medical condition  °Reduce fear, anxiety about the health condition  °Accept the illness °Accept the role of psychological and behavioral factors  °Stabilize anxiety level wile increasing ability to function °Learn and implement coping skills that result in a reduction of anxiety  °Alleviate depressive symptoms °Recognize, accept, and cope with depressive feelings °Develop healthy thinking patterns °Develop healthy interpersonal relationships ° °Objectives °Identify feelings associated with the illness °Family members share with each other feelings °Identify the losses or limitations that have been experienced °Verbalize acceptance of the reality of the medical condition °Commit to learning and implement a proactive approach to managing personal stresses °Verbalize an understanding of the medical condition °Work with therapist to develop a plan for coping with stress °Learn and implement skills for managing stress °Engage in social, productive activities that are possible °Engage in faith based activities °implement positive imagery °Identify coping skills and sources of emotional support °Patient's partner and family members verbalize their fears regarding severity of health condition °Identify sources of emotional distress  °Learning and implement calming skills to reduce overall anxiety °Learn and implement problem solving strategies °Identify and engage in pleasant activities °Learning and implement personal and interpersonal skills to reduce anxiety and improve interpersonal relationships °Learn to accept limitations in life and commit to tolerating, rather than avoiding, unpleasant emotions while accomplishing meaningful goals °Identify major life conflicts from the past and present that form the  basis for present anxiety °Learn and implement behavioral strategies °Verbalize an understanding and resolution of current interpersonal problems °Learn and implement problem solving and decision making skills °Learn and implement conflict resolution skills to resolve interpersonal problems °Verbalize an understanding of healthy and unhealthy emotions verbalize insight into how past relationships may be influence current experiences with depression °Use mindfulness and acceptance strategies and increase value based behavior  °Increase hopeful statements about the future.  ° °Interventions °Teach about stress and ways to handle stress °Assist the patient in developing a coping action plan for stressors °Conduct skills based training for coping strategies °Train problem focused skills °Sort out what activities the individual can do °Encourage patient to rely upon his/her spiritual faith °Teach the patient to use guided imagery °Probe and evaluate family's ability to provide emotional support °Allow family to share their fears °Assist the patient in identifying, sorting through, and verbalizing the various feelings generated by his/her medical condition °Meet with family members  °Ask patient list out limitations  °Use stress inoculation training  °Use Acceptance and Commitment Therapy to help client accept uncomfortable realities in order to accomplish value-consistent goals °Reinforce the client's insight into the role of his/her past emotional pain and present anxiety  °Discuss examples demonstrating that unrealistic worry overestimates the probability of threats and underestimate patient's ability  °Assist the patient in analyzing his or her worries °Help patient understand that avoidance is reinforcing  °Behavioral activation help the client explore the relationship, nature of the dispute,  °Help the client develop new interpersonal skills and relationships °Conduct Problem so living therapy °Teach conflict  resolution skills °Use a process-experiential approach °Conduct TLDP °Conduct ACT °

## 2021-12-25 ENCOUNTER — Encounter (HOSPITAL_COMMUNITY): Payer: Medicare HMO

## 2021-12-25 NOTE — Progress Notes (Signed)
EPIC Encounter for ICM Monitoring  Patient Name: Rhonda [name] is a 71 y.o. female Date: 12/25/2021 Primary Care Physican: [name], MD Primary Cardiologist: Bensimhon Electrophysiologist: Santina Evans Pacing: 98.8%   12/15/2021  Weight: 208 lbs          Spoke with patient and heart failure questions reviewed.  Pt asymptomatic for fluid accumulation.  Reports feeling well at this time and voices no complaints.    Optivol thoracic impedance suggesting normal fluid levels with exception of 7/10-7/24 suggesting possible fluid accumulation.    Prescribed:  Patient participating in paramedicine program to assist with meds. Torsemide 20 mg 4 tablets (80 mg total) by mouth every morning and 3 tablets (60 mg total) every evening.   Potassium 20 meq 3 tablets (60 mEq total) by mouth 3 (three) times daily.  Take extra 2 tablets on days you take Metolazone.   Metolazone 2.'5mg'$  1 tablet every other Tuesday. Spironolactone 25 mg take 1 tablet by mouth every evening.   Labs: 12/03/2021 Creatinine 1.33, BUN 33, Potassium 4.3, Sodium 133, GFR 43 12/02/2021 Creatinine 1.68, BUN 49, Potassium 4.3, Sodium 137, GFR 33  12/01/2021 Creatinine 1.82, BUN 52, Potassium 3.0, Sodium 133, GFR 30  11/12/2021 Creatinine 1.90, BUN 31, Potassium 3.3, Sodium 132, GFR 28 10/10/2021 Creatinine 1.11, BUN 18, Potassium 3.7, Sodium 140, GFR 54 10/09/2021 Creatinine 1.47, BUN 33, Potassium 3.8, Sodium 136, GFR 39  10/08/2021 Creatinine 2.05, BUN 36, Potassium 4.3, Sodium 133, GFR 26  10/06/2021 Creatinine 2.04, BUN 42, Potassium 5.0, Sodium 134, GFR 26  10/05/2021 Creatinine 1.78, BUN 34, Potassium 3.6, Sodium 134, GFR 31  09/04/2021 Creatinine 1.66, BUN 41, Potassium 3.7, Sodium 135, GFR 33 A complete set of results can be found in Results Review.   Recommendations:  No changes and encouraged to call if experiencing any fluid symptoms.   Follow-up plan: ICM clinic phone appointment on 01/27/2022.   91 day device  clinic remote transmission 03/11/2022.     EP/Cardiology Office Visits:   01/07/2022 with [facility].  Recall 07/26/2021 with Dr Lovena Le.     Copy of ICM check sent to Dr. Lovena Le.  3 month ICM trend: 12/22/2021.    12-14 Month ICM trend:     [name], RN 12/25/2021 11:55 AM

## 2021-12-26 ENCOUNTER — Telehealth: Payer: Self-pay

## 2021-12-26 NOTE — Telephone Encounter (Signed)
(  3:05 pm) PC SW scheduled a follow-up visit with patient. Patient scheduled with the RN/SW team for 12/31/21 '@10'$ :30 am.

## 2021-12-29 ENCOUNTER — Other Ambulatory Visit (HOSPITAL_COMMUNITY): Payer: Self-pay

## 2021-12-29 NOTE — Progress Notes (Signed)
Paramedicine Encounter    Patient ID: Rhonda [name], female    DOB: June 28, 1950, 71 y.o.   MRN: [id number]   Arrived for home visit for Rhonda [name] who reports to be feeling okay today. She denied shortness of breath, dizziness, chest pain. Rhonda [name]'s weight is down to 198lbs this week. She does note to have some lower left leg swelling. I obtained vitals as noted:  WT- 198lbs BP- 132/70 HR- 86 RR-18 O2- 98%  Rhonda [name] missed 3 noon doses and 4 evening doses last week of her medications. She was reminded the importance of taking all medications as placed in pillbox on time. She verbalized understanding.   I filled pill box for one week with refills as noted-   Refills: Gabapentin   I gave Rhonda [name] her morning meds during our visit as she did not take them this morning.   We reviewed upcoming appointments and confirmed same. I wrote them all down on both calendars she has in her home.   Rhonda [name] knows to reach out to me if needed but I plan to see her in one week. Home visit complete.   Rhonda [name], [place] 12/29/2021    Patient Care Team: [name], MD as PCP - General (Family Medicine) [name], LCSW as Social Worker (Licensed Clinical Social Worker) [name], [name], DO as Consulting Physician (Neurology) Rhonda [name], EMT as Paramedic [name] [name], MD as Attending Physician (Family Medicine)  Patient Active Problem List   Diagnosis Date Noted   Elevated troponin 12/02/2021   Nausea & vomiting 12/01/2021   Acute left ankle pain 10/11/2021   Intractable nausea and vomiting 10/08/2021   AKI (acute kidney injury) ([place]) 10/08/2021   Hyponatremia 10/08/2021   Decreased estrogen level 08/01/2021   Dementia ([place]) 08/01/2021   Frail elderly 08/01/2021   Hyperparathyroidism ([place]) 08/01/2021   Spondylolisthesis 08/01/2021   Varicose veins of bilateral lower extremities with other complications 50/93/2671   Lumbago without sciatica  04/30/2021   Abnormal gait 06/11/2020   Adult failure to thrive syndrome 06/11/2020   Allergic rhinitis 06/11/2020   Anxiety 06/11/2020   Asthma 06/11/2020   Benign intracranial hypertension 06/11/2020   Body mass index (BMI) 45.0-49.9, adult ([place]) 06/11/2020   Bowel incontinence 06/11/2020   Cholelithiasis 06/11/2020   Chronic sinusitis 06/11/2020   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   Daytime somnolence 06/11/2020   Diarrhea of presumed infectious origin 06/11/2020   Edema 06/11/2020   Family history of malignant neoplasm of gastrointestinal tract 06/11/2020   Gout 06/11/2020   History of fall 06/11/2020   History of infectious disease 06/11/2020   Insomnia 06/11/2020   Irregular bowel habits 06/11/2020   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   Lumbar spondylosis with myelopathy 06/11/2020   Macrocytosis 06/11/2020   Mild recurrent major depression ([place]) 06/11/2020   Personal history of colonic polyps 06/11/2020   Personal history of malignant neoplasm of breast 06/11/2020   Repeated falls 06/11/2020   Mixed anxiety and depressive disorder 06/11/2020   Irritable bowel syndrome 01/04/2020   Spinal stenosis of lumbar region 01/03/2020   Other symptoms and signs involving the musculoskeletal system 09/11/2019   Bilateral leg weakness 08/01/2019   Leg swelling 08/01/2019   Diabetes mellitus with neuropathy ([place]) 06/15/2019   Hypokalemia 11/08/2018   CKD (chronic kidney disease) stage 3, GFR 30-59 ml/min (HCC) 11/08/2018   Orthostatic hypotension 07/28/2017   SVD (spontaneous vaginal delivery)    Peripheral neuropathy    On home oxygen therapy    Migraines  Left bundle branch block    Hypothyroidism    Hypertension    Hyperlipidemia    Heart murmur    GERD (gastroesophageal reflux disease)    Exertional shortness of breath    Major depressive disorder    Back pain    Arthritis    Generalized anxiety disorder    Anemia    Chronic respiratory failure ([place]) 09/14/2013   Biventricular  ICD (implantable cardioverter-defibrillator) in place 08/04/2013   Morbid obesity ([place]) 66/59/9357   Chronic systolic heart failure ([place]) 10/27/2012   Endometrial polyp 01/20/2012   Malignant tumor of breast ([place]) 03/26/2011   Unspecified vitamin D deficiency 03/26/2011    Current Outpatient Medications:    acetaminophen (TYLENOL) 500 MG tablet, Take 1,000 mg by mouth daily as needed (for pain and fever of 101 or greater)., Disp: , Rfl:    acetaZOLAMIDE (DIAMOX) 125 MG tablet, Take 1 tablet (125 mg total) by mouth daily., Disp: 90 tablet, Rfl: 3   carvedilol (COREG) 3.125 MG tablet, TAKE 1 TABLET (3.125 MG) BY MOUTH TWICE DAILY WITH MEALS, Disp: 180 tablet, Rfl: 3   citalopram (CELEXA) 20 MG tablet, Take 20 mg by mouth daily., Disp: , Rfl:    clonazePAM (KLONOPIN) 0.5 MG tablet, Take 0.5 mg by mouth 2 (two) times daily as needed for anxiety., Disp: , Rfl:    dicyclomine (BENTYL) 20 MG tablet, Take 20 mg by mouth 3 (three) times daily before meals., Disp: , Rfl:    gabapentin (NEURONTIN) 300 MG capsule, Take 300 mg by mouth 2 (two) times daily., Disp: , Rfl:    levothyroxine (SYNTHROID) 88 MCG tablet, TAKE 1 TABLET(88 MCG) BY MOUTH DAILY BEFORE BREAKFAST, Disp: 90 tablet, Rfl: 3   metolazone (ZAROXOLYN) 2.5 MG tablet, Take 1 tablet by mouth every other Tuesday., Disp: 12 tablet, Rfl: 3   midodrine (PROAMATINE) 10 MG tablet, TAKE 1 TABLET THREE TIMES DAILY WITH MEALS, Disp: 270 tablet, Rfl: 3   pantoprazole (PROTONIX) 40 MG tablet, Take 40 mg by mouth daily as needed (for acid reflux). (Patient not taking: Reported on 12/22/2021), Disp: , Rfl:    potassium chloride SA (KLOR-CON M) 20 MEQ tablet, Take 3 tablets (60 mEq total) by mouth 3 (three) times daily., Disp: 572 tablet, Rfl: 0   simvastatin (ZOCOR) 10 MG tablet, Take 1 tablet (10 mg total) by mouth every evening., Disp: 30 tablet, Rfl: 11   spironolactone (ALDACTONE) 25 MG tablet, Take 1 tablet (25 mg total) by mouth every evening. Needs appt  for further refills, Disp: 90 tablet, Rfl: 0   torsemide (DEMADEX) 20 MG tablet, Take 4 tablets (80 mg total) by mouth every morning AND 3 tablets (60 mg total) every evening., Disp: 210 tablet, Rfl: 11 Allergies  Allergen Reactions   Ceftin Anaphylaxis    Face and throat swell    Geodon [Ziprasidone Hcl] Hives   Lisinopril Other (See Comments)    angioedema   Cefuroxime     Other reaction(s): anaphylaxis   Sulfacetamide Sodium-Sulfur     Other reaction(s): itch   Allopurinol Nausea Only and Other (See Comments)    weakness   Ativan [Lorazepam] Itching   Sulfa Antibiotics Itching   Valium [Diazepam] Other (See Comments)    Patient states that diazepam doesn't relax, it has the opposite effect.     Social History   Socioeconomic History   Marital status: Married    Spouse name: Not on file   Number of children: 2   Years of education:  18   Highest education level: Master's degree (e.g., MA, MS, MEng, MEd, MSW, MBA)  Occupational History   Occupation: retired  Tobacco Use   Smoking status: Former    Packs/day: 0.10    Years: 26.00    Total pack years: 2.60    Types: Cigarettes    Quit date: 03/23/2021    Years since quitting: 0.7   Smokeless tobacco: Never  Vaping Use   Vaping Use: Never used  Substance and Sexual Activity   Alcohol use: No   Drug use: No   Sexual activity: Yes  Other Topics Concern   Not on file  Social History Narrative   Tobacco Use Cigarettes: Former Smoker, Quit in 2008   No Alcohol   No recreational drug use   Diet: Regular/Low Carb   Exercise: None   Occupation: disabled   Education: Research officer, political party, masters   Children: 2   Firearms: No   Therapist, art Use: 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      Social Determinants of Health   Financial Resource Strain: Not on file  Food Insecurity: Not on file  Transportation Needs: Not on file  Physical Activity: Not on file  Stress: Not on file  Social Connections:  Not on file  Intimate Partner Violence: Not on file    Physical Exam      Future Appointments  Date Time Provider [place]  12/31/2021 10:30 AM Conan Bowens, RN ACP-ACP None  01/07/2022  2:30 PM [facility] PA/NP [facility] None  01/27/2022 10:55 AM [facility] DEVICE REMOTES [facility]  02/04/2022  1:00 PM Conception Chancy, PsyD [facility]  03/04/2022  2:30 PM Marzetta Board, DPM [facility]  03/11/2022  7:15 AM [facility] DEVICE REMOTES [facility]  04/01/2022  1:00 PM Hazle Coca, PhD [facility] None  04/01/2022  2:00 PM LBN- NEUROPSYCH TECH [facility] None  04/08/2022  2:30 PM Hazle Coca, PhD [facility] None  04/15/2022  8:50 AM Narda Amber K, DO [facility] None  06/10/2022  7:15 AM [facility] DEVICE REMOTES [facility]  09/09/2022  7:15 AM [facility] DEVICE REMOTES [facility]  12/09/2022  7:15 AM [facility] DEVICE REMOTES [facility]     ACTION: Home visit completed

## 2021-12-31 ENCOUNTER — Other Ambulatory Visit: Payer: Medicare HMO | Admitting: *Deleted

## 2021-12-31 ENCOUNTER — Other Ambulatory Visit: Payer: Medicare HMO

## 2021-12-31 VITALS — BP 110/77 | HR 88 | Temp 97.7°F | Resp 18

## 2021-12-31 DIAGNOSIS — Z515 Encounter for palliative care: Secondary | ICD-10-CM

## 2021-12-31 NOTE — Progress Notes (Signed)
COMMUNITY PALLIATIVE CARE SW NOTE  PATIENT NAME: Rhonda [name] DOB: [birth year] MRN: [id number]  PRIMARY CARE PROVIDER: [name], MD  RESPONSIBLE PARTY:  Acct ID - Guarantor Home Phone Work Phone Relationship Acct Type  [id number] Rhonda [name], [name]  Self P/F     [address], [place], [place] [address]   SOCIAL WORK ENCOUNTER  PC SW and RN-M. Howard completed a visit with patient at her home. Her husband-Rhonda [name] was present for this visit. The patient and her husband was provided education regarding palliative care services, role in patient's care, when and how to contact the team for support and billing. Both patient and her husband verbalized understanding and provided verbal consent to services.  They both provided an update on patient status since her hospitalizations.   Patient had two hospitalization in May and most recently in July. Patient report that she does experience headaches, which she manages with Tylenol. She also report neuropathic pain in both feet that she treats by soaking them in warm/cold foot soaks or applying ice. Patient report decreased appetite. She changed her diet to bland/softer, smaller meals to include snacks and fluids throughout the day. She reports that her stomach pain is much improved.  Patient's most recent weight is 197 lbs.   Patient ambulates independently and denied any recent falls. But does use as cane outside of the home for appointments and outings. Patient report ongoing fatigue and intermittent weakness. Patient have a caregiver through [facility] 2x week ([place] for 3 hours to assist with cooking meals and light housekeeping. Patient is also receiving services (medication management & refills)  1x week through the EMT program.  Patient is alert and oriented x3, but is forgetful. She relies on her husband to provide history. Patient is from [place], [place]. She has been married to her husband-Rhonda [name] for 45 years.  They have two children. Patient has been disabled for several years. Patient is of [employer]. Patient's husband is her PCG, but works a Research officer, political party job. Patient's daughters are supportive and are also active in patient's care.   *Patient scheduled with Dr. [name] on 02/26/22 at 11:30 am for follow-up.   CODE STATUS: Full Code ADVANCED DIRECTIVES: No MOST FORM COMPLETE:  Yes, on file HOSPICE EDUCATION PROVIDED: No  Duration of Visit and Documentation: 60 minutes.  [address] [place], [place]

## 2021-12-31 NOTE — Progress Notes (Signed)
[facility] PALLIATIVE CARE RN NOTE  PATIENT NAME: Rhonda [name] DOB: 10-30-1950 MRN: [id number]  PRIMARY CARE PROVIDER: [name], MD  RESPONSIBLE PARTY: Rhonda [name] (spouse) Acct ID - Guarantor Home Phone Work Phone Relationship Acct Type  [id number] Rhonda [name]* [phone]  Self P/F     [address] [place] [address]   RN/SW follow up visit completed with patient and her husband Rhonda [name].  Cognitive: She is alert and oriented x 3, pleasant mood, but is forgetful at times. Hx of dementia. Husband states she is now not allowed to cook as she forgets and leaves things cooking on the stove. Currently has a burnt pot on the stove  Pain: Patient denies pain at this time. She does report experiencing headaches and takes Tylenol for this. She also experiences neuropathic pain, alternating between both feet. Describes as pins and needles. She takes Gabapentin prn. She also has tried warm foot soaks and cold and feels applying ice helps the best.   Respiratory: Reports dyspnea with exertion. Does not require oxygen.   Cardiovascular: Left chest pacemaker and defibrillator. Keeps records of her blood pressures most of the time. Encouraged her to weigh daily. She is signed up with the EMT program who visits every Monday to organize her medications and orders refills.  Appetite: Denies having an appetite and makes herself eat. Eats small meals/snacks throughout the day. Mainly eats just to take her medications which are 4x/day. She does not always take each dose especially when feeling more tired. Denies dysphagia. Drinks well. Used to have issues with abdominal pain. Now eating a more bland diet which has helped. No raw foods.  Mobility: Ambulates independently. Will use walker in the home if having a weak day, but always uses it when travelling outside of the home. Feels tired most of the time and spends most of her day sitting or lying. Has an aide through Rhonda [name] who comes T/Th for 3 hours/day to help cook meals and light housework.  GI/GU: Started seeing a kidney specialist and her kidney function is now at 34% so is now Stage 3. Husband says they are trying to get a reduction in her medications to help preserve her kidney function. Has regular bowel movements. Last BM yesterday.  Palliative care team will continue to follow patient. Follow up visit scheduled with Rhonda [name] on 02/26/22 @ 11:30a.   HISTORY OF PRESENT ILLNESS: This is a 71 yo female with a diagnosis of chronic systolic heart failure. She has a history of left BBB, HTN, orthostatic hypotension, HLD, heart murmur, CKD Stage 3, migraines, GERD, IBS, DM w/neuropathy, hypothyroidism and malignant breast tumor. She was hospitalized for 25 hours from 12/01/21 to 12/02/21 for nausea/vomiting. Palliative care team assists with symptom management, goals of care and complex decision making.  CODE STATUS: Full Code ADVANCED DIRECTIVES: N MOST FORM: no PPS: 50%   PHYSICAL EXAM:   VITALS: Today's Vitals   12/31/21 1051  BP: 110/77  Pulse: 88  Resp: 18  Temp: 97.7 F (36.5 C)  TempSrc: Temporal  SpO2: 98%  PainSc: 0-No pain    LUNGS: clear to auscultation  CARDIAC: Cor RRR, left chest pacemaker/defribrillator EXTREMITIES: No edema SKIN: Skin color, texture, turgor normal. No rashes or lesions  NEURO:  Alert and oriented x 3, forgetful, generalized weakness, ambulatory    [facility]onda [place], RN BSN

## 2022-01-01 ENCOUNTER — Other Ambulatory Visit (HOSPITAL_COMMUNITY): Payer: Self-pay | Admitting: Physician Assistant

## 2022-01-05 ENCOUNTER — Other Ambulatory Visit (HOSPITAL_COMMUNITY): Payer: Self-pay

## 2022-01-05 NOTE — Progress Notes (Signed)
Remote ICD transmission.   

## 2022-01-05 NOTE — Progress Notes (Signed)
Paramedicine Encounter    Patient ID: Rhonda [name], female    DOB: 1950/09/02, 71 y.o.   MRN: [id number]   Arrived for home visit for Rhonda [name] who reports to be feeling okay but having a headache for several days reportedly taking tylenol as needed. She reports this helps. She has been mostly compliant with her meds in her pill box over the last week except one morning dose, two noon doses and one evening dose. I stressed the importance of med compliance to her and her husband during our visit. She reports her issue is waking up so late in the day and falling behind her medication schedule. I advised her the schedule and that she should be waking up the alarms we set for her meds and eating a small meal and taking them as prescribed. She verbalized understanding and plan.   I obtained vitals and they are as noted: WT- 197lbs BP- 122/86 HR-87 O2- 98% RR- 16  Some mild swelling on the lower left leg- no pitting. Lungs clear. No abdominal distention or JVD.   We reviewed meds and I filled pill box for one week, also giving her, her morning meds during our visit and setting out her noon and evening meds with notes of times to take them. She appreciated this as she is forgetful.   She stated she has had trouble with her memory a  lot this week accidentally burning a meal on the stove because she forgot she was cooking it. Palliative care LCSW saw patient in the home last week and they discussed this also. She plans to reach out to PCP. I stressed the importance of decreasing her amount of using the stove when she does not have her husband, daughter or aide there with her. She agreed to only use the microwave when she is alone. Meals are being prepped by aide and husband.     I plan to meet Rhonda [name] in the clinic on Wednesday- appointment wrote out on calendar and husband aware and plans to bring her to same. She agreed and home visit completed.   Refills: Gabapentin   [name],  EMT-Paramedic [phone] 01/05/2022   Patient Care Team: [name], MD as PCP - General (Family Medicine) [name], LCSW as Social Worker (Licensed Clinical Social Worker) [name], [name], DO as Consulting Physician (Neurology) [name], EMT as Paramedic [name] [name], MD as Attending Physician (Family Medicine)  Patient Active Problem List   Diagnosis Date Noted   Elevated troponin 12/02/2021   Nausea & vomiting 12/01/2021   Acute left ankle pain 10/11/2021   Intractable nausea and vomiting 10/08/2021   AKI (acute kidney injury) ([place]) 10/08/2021   Hyponatremia 10/08/2021   Decreased estrogen level 08/01/2021   Dementia ([place]) 08/01/2021   Frail elderly 08/01/2021   Hyperparathyroidism ([place]) 08/01/2021   Spondylolisthesis 08/01/2021   Varicose veins of bilateral lower extremities with other complications 96/28/3662   Lumbago without sciatica 04/30/2021   Abnormal gait 06/11/2020   Adult failure to thrive syndrome 06/11/2020   Allergic rhinitis 06/11/2020   Anxiety 06/11/2020   Asthma 06/11/2020   Benign intracranial hypertension 06/11/2020   Body mass index (BMI) 45.0-49.9, adult ([place]) 06/11/2020   Bowel incontinence 06/11/2020   Cholelithiasis 06/11/2020   Chronic sinusitis 06/11/2020   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   Daytime somnolence 06/11/2020   Diarrhea of presumed infectious origin 06/11/2020   Edema 06/11/2020   Family history of malignant neoplasm of gastrointestinal tract 06/11/2020   Gout  06/11/2020   History of fall 06/11/2020   History of infectious disease 06/11/2020   Insomnia 06/11/2020   Irregular bowel habits 06/11/2020   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   Lumbar spondylosis with myelopathy 06/11/2020   Macrocytosis 06/11/2020   Mild recurrent major depression ([place]) 06/11/2020   Personal history of colonic polyps 06/11/2020   Personal history of malignant neoplasm of breast 06/11/2020   Repeated falls 06/11/2020   Mixed  anxiety and depressive disorder 06/11/2020   Irritable bowel syndrome 01/04/2020   Spinal stenosis of lumbar region 01/03/2020   Other symptoms and signs involving the musculoskeletal system 09/11/2019   Bilateral leg weakness 08/01/2019   Leg swelling 08/01/2019   Diabetes mellitus with neuropathy ([place]) 06/15/2019   Hypokalemia 11/08/2018   CKD (chronic kidney disease) stage 3, GFR 30-59 ml/min (HCC) 11/08/2018   Orthostatic hypotension 07/28/2017   SVD (spontaneous vaginal delivery)    Peripheral neuropathy    On home oxygen therapy    Migraines    Left bundle branch block    Hypothyroidism    Hypertension    Hyperlipidemia    Heart murmur    GERD (gastroesophageal reflux disease)    Exertional shortness of breath    Major depressive disorder    Back pain    Arthritis    Generalized anxiety disorder    Anemia    Chronic respiratory failure ([place]) 09/14/2013   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   Morbid obesity ([place]) 24/40/1027   Chronic systolic heart failure ([place]) 10/27/2012   Endometrial polyp 01/20/2012   Malignant tumor of breast ([place]) 03/26/2011   Unspecified vitamin D deficiency 03/26/2011    Current Outpatient Medications:    acetaminophen (TYLENOL) 500 MG tablet, Take 1,000 mg by mouth daily as needed (for pain and fever of 101 or greater)., Disp: , Rfl:    acetaZOLAMIDE (DIAMOX) 125 MG tablet, Take 1 tablet (125 mg total) by mouth daily., Disp: 90 tablet, Rfl: 3   carvedilol (COREG) 3.125 MG tablet, TAKE 1 TABLET (3.125 MG) BY MOUTH TWICE DAILY WITH MEALS, Disp: 180 tablet, Rfl: 3   citalopram (CELEXA) 20 MG tablet, Take 20 mg by mouth daily., Disp: , Rfl:    clonazePAM (KLONOPIN) 0.5 MG tablet, Take 0.5 mg by mouth 2 (two) times daily as needed for anxiety., Disp: , Rfl:    dicyclomine (BENTYL) 20 MG tablet, Take 20 mg by mouth 3 (three) times daily before meals., Disp: , Rfl:    gabapentin (NEURONTIN) 300 MG capsule,  Take 300 mg by mouth 2 (two) times daily., Disp: , Rfl:    levothyroxine (SYNTHROID) 88 MCG tablet, TAKE 1 TABLET(88 MCG) BY MOUTH DAILY BEFORE BREAKFAST, Disp: 90 tablet, Rfl: 3   metolazone (ZAROXOLYN) 2.5 MG tablet, Take 1 tablet by mouth every other Tuesday., Disp: 12 tablet, Rfl: 3   midodrine (PROAMATINE) 10 MG tablet, TAKE 1 TABLET THREE TIMES DAILY WITH MEALS, Disp: 270 tablet, Rfl: 3   pantoprazole (PROTONIX) 40 MG tablet, Take 40 mg by mouth daily as needed (for acid reflux). (Patient not taking: Reported on 12/22/2021), Disp: , Rfl:    potassium chloride SA (KLOR-CON M) 20 MEQ tablet, Take 3 tablets (60 mEq total) by mouth 3 (three) times daily., Disp: 572 tablet, Rfl: 0   simvastatin (ZOCOR) 10 MG tablet, Take 1 tablet (10 mg total) by mouth every evening., Disp: 30 tablet, Rfl: 11   spironolactone (ALDACTONE) 25 MG tablet, TAKE 1 TABLET (25 MG TOTAL) BY MOUTH EVERY EVENING., Disp:  90 tablet, Rfl: 0   torsemide (DEMADEX) 20 MG tablet, Take 4 tablets (80 mg total) by mouth every morning AND 3 tablets (60 mg total) every evening., Disp: 210 tablet, Rfl: 11 Allergies  Allergen Reactions   Ceftin Anaphylaxis    Face and throat swell    Geodon [Ziprasidone Hcl] Hives   Lisinopril Other (See Comments)    angioedema   Cefuroxime     Other reaction(s): anaphylaxis   Sulfacetamide Sodium-Sulfur     Other reaction(s): itch   Allopurinol Nausea Only and Other (See Comments)    weakness   Ativan [Lorazepam] Itching   Sulfa Antibiotics Itching   Valium [Diazepam] Other (See Comments)    Patient states that diazepam doesn't relax, it has the opposite effect.     Social History   Socioeconomic History   Marital status: Married    Spouse name: Not on file   Number of children: 2   Years of education: 76   Highest education level: Master's degree (e.g., MA, MS, MEng, MEd, MSW, MBA)  Occupational History   Occupation: retired  Tobacco Use   Smoking status: Former    Packs/day: 0.10     Years: 26.00    Total pack years: 2.60    Types: Cigarettes    Quit date: 03/23/2021    Years since quitting: 0.7   Smokeless tobacco: Never  Vaping Use   Vaping Use: Never used  Substance and Sexual Activity   Alcohol use: No   Drug use: No   Sexual activity: Yes  Other Topics Concern   Not on file  Social History Narrative   Tobacco Use Cigarettes: Former Smoker, Quit in 2008   No Alcohol   No recreational drug use   Diet: Regular/Low Carb   Exercise: None   Occupation: disabled   Education: Research officer, political party, masters   Children: 2   Firearms: No   Therapist, art Use: 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      Social Determinants of Health   Financial Resource Strain: Not on file  Food Insecurity: Not on file  Transportation Needs: Not on file  Physical Activity: Not on file  Stress: Not on file  Social Connections: Not on file  Intimate Partner Violence: Not on file    Physical Exam      Future Appointments  Date Time Provider [place]  01/07/2022  2:30 PM [facility] PA/NP [facility] None  01/27/2022 10:55 AM [facility] DEVICE REMOTES [facility]  02/04/2022  1:00 PM Conception Chancy, PsyD [facility]  02/26/2022 11:30 AM [name], Tiffany L, DO ACP-[facility] None  03/04/2022  2:30 PM Marzetta Board, DPM [facility]  03/11/2022  7:15 AM [facility] DEVICE REMOTES [facility]  04/01/2022  1:00 PM Hazle Coca, PhD [facility] None  04/01/2022  2:00 PM LBN- NEUROPSYCH TECH [facility] None  04/08/2022  2:30 PM Hazle Coca, PhD [facility] None  04/15/2022  8:50 AM Narda Amber K, DO [facility] None  06/10/2022  7:15 AM [facility] DEVICE REMOTES [facility]  09/09/2022  7:15 AM [facility] DEVICE REMOTES [facility]  12/09/2022  7:15 AM [facility] DEVICE REMOTES [facility]     ACTION: Home visit completed

## 2022-01-06 ENCOUNTER — Other Ambulatory Visit: Payer: Medicare HMO | Admitting: Internal Medicine

## 2022-01-07 ENCOUNTER — Encounter (HOSPITAL_COMMUNITY): Payer: Self-pay

## 2022-01-07 ENCOUNTER — Other Ambulatory Visit (HOSPITAL_COMMUNITY): Payer: Self-pay

## 2022-01-07 ENCOUNTER — Ambulatory Visit (HOSPITAL_COMMUNITY)
Admission: RE | Admit: 2022-01-07 | Discharge: 2022-01-07 | Disposition: A | Discharge status: Home / Self Care | Payer: Medicare HMO | Source: Ambulatory Visit | Attending: Family Medicine | Admitting: Family Medicine

## 2022-01-07 ENCOUNTER — Telehealth (HOSPITAL_COMMUNITY): Payer: Self-pay | Admitting: Licensed Clinical Social Worker

## 2022-01-07 VITALS — BP 120/72 | HR 83 | Wt 198.8 lb

## 2022-01-07 DIAGNOSIS — Z6835 Body mass index (BMI) 35.0-35.9, adult: Secondary | ICD-10-CM | POA: Insufficient documentation

## 2022-01-07 DIAGNOSIS — I5022 Chronic systolic (congestive) heart failure: Secondary | ICD-10-CM | POA: Diagnosis not present

## 2022-01-07 DIAGNOSIS — G932 Benign intracranial hypertension: Secondary | ICD-10-CM | POA: Diagnosis not present

## 2022-01-07 DIAGNOSIS — E1122 Type 2 diabetes mellitus with diabetic chronic kidney disease: Secondary | ICD-10-CM | POA: Insufficient documentation

## 2022-01-07 DIAGNOSIS — F32A Depression, unspecified: Secondary | ICD-10-CM | POA: Diagnosis not present

## 2022-01-07 DIAGNOSIS — I130 Hypertensive heart and chronic kidney disease with heart failure and stage 1 through stage 4 chronic kidney disease, or unspecified chronic kidney disease: Secondary | ICD-10-CM | POA: Diagnosis not present

## 2022-01-07 DIAGNOSIS — I10 Essential (primary) hypertension: Secondary | ICD-10-CM | POA: Diagnosis not present

## 2022-01-07 DIAGNOSIS — N183 Chronic kidney disease, stage 3 unspecified: Secondary | ICD-10-CM | POA: Insufficient documentation

## 2022-01-07 DIAGNOSIS — Z87730 Personal history of (corrected) cleft lip and palate: Secondary | ICD-10-CM | POA: Diagnosis not present

## 2022-01-07 DIAGNOSIS — E669 Obesity, unspecified: Secondary | ICD-10-CM | POA: Insufficient documentation

## 2022-01-07 DIAGNOSIS — Z9221 Personal history of antineoplastic chemotherapy: Secondary | ICD-10-CM | POA: Diagnosis not present

## 2022-01-07 DIAGNOSIS — Z923 Personal history of irradiation: Secondary | ICD-10-CM | POA: Insufficient documentation

## 2022-01-07 DIAGNOSIS — Z853 Personal history of malignant neoplasm of breast: Secondary | ICD-10-CM | POA: Insufficient documentation

## 2022-01-07 DIAGNOSIS — I428 Other cardiomyopathies: Secondary | ICD-10-CM | POA: Diagnosis not present

## 2022-01-07 DIAGNOSIS — I878 Other specified disorders of veins: Secondary | ICD-10-CM | POA: Diagnosis not present

## 2022-01-07 DIAGNOSIS — Z79899 Other long term (current) drug therapy: Secondary | ICD-10-CM | POA: Insufficient documentation

## 2022-01-07 DIAGNOSIS — I872 Venous insufficiency (chronic) (peripheral): Secondary | ICD-10-CM | POA: Diagnosis not present

## 2022-01-07 DIAGNOSIS — I951 Orthostatic hypotension: Secondary | ICD-10-CM | POA: Diagnosis not present

## 2022-01-07 DIAGNOSIS — Z95810 Presence of automatic (implantable) cardiac defibrillator: Secondary | ICD-10-CM

## 2022-01-07 DIAGNOSIS — N1832 Chronic kidney disease, stage 3b: Secondary | ICD-10-CM | POA: Diagnosis not present

## 2022-01-07 DIAGNOSIS — Z171 Estrogen receptor negative status [ER-]: Secondary | ICD-10-CM | POA: Insufficient documentation

## 2022-01-07 DIAGNOSIS — Z9181 History of falling: Secondary | ICD-10-CM | POA: Diagnosis not present

## 2022-01-07 DIAGNOSIS — F419 Anxiety disorder, unspecified: Secondary | ICD-10-CM | POA: Diagnosis not present

## 2022-01-07 NOTE — Patient Instructions (Addendum)
Thank you for coming in today  Labs were done today, if any labs are abnormal the clinic will call you No news is good news  No medication changes today  Your physician recommends that you schedule a follow-up appointment in:  6 months with Dr. Haroldine Laws   Do the following things EVERYDAY: Weigh yourself in the morning before breakfast. Write it down and keep it in a log. Take your medicines as prescribed Eat low salt foods--Limit salt (sodium) to 2000 mg per day.  Stay as active as you can everyday Limit all fluids for the day to less than 2 liters  At the [facility], you and your health needs are our priority. As part of our continuing mission to provide you with exceptional heart care, we have created designated Provider Care Teams. These Care Teams include your primary Cardiologist (physician) and Advanced Practice Providers (APPs- Physician Assistants and Nurse Practitioners) who all work together to provide you with the care you need, when you need it.   You may see any of the following providers on your designated Care Team at your next follow up: Dr Glori Bickers Dr Haynes Kerns, NP Lyda Jester, [place] [facility] [place], [place] Audry Riles, PharmD   Please be sure to bring in all your medications bottles to every appointment.   If you have any questions or concerns before your next appointment please send [place] a message through [place] or call our office at [phone].    TO LEAVE A MESSAGE FOR THE NURSE SELECT OPTION 2, PLEASE LEAVE A MESSAGE INCLUDING: YOUR NAME DATE OF BIRTH CALL BACK NUMBER REASON FOR CALL**this is important as we prioritize the call backs  YOU WILL RECEIVE A CALL BACK THE SAME DAY AS LONG AS YOU CALL BEFORE 4:00 PM

## 2022-01-07 NOTE — Progress Notes (Signed)
Paramedicine Encounter    Patient ID: Rhonda [name], female    DOB: [birth year], 71 y.o.   MRN: [id number]    Met with Rhonda [name] in the clinic today where she was seen by [name] NP. Today she made no med changes, we just discussed adjusting timing of meds for pill box to morning, noon, eve to increase med compliance as she has been missing meds due to getting up late in the day. She was happy as she thinks this will improve her med compliance.   We discussed diet and upcoming appointments. I will see her on Monday in the home.   No refills neded.   6 month follow up with Bensimhon.   Visit complete.   [name], [place] 01/07/2022     ACTION: Home visit completed

## 2022-01-07 NOTE — Progress Notes (Signed)
ADVANCED HF CLINIC NOTE   Patient ID: Rhonda [name], female   DOB: 1950-08-15, 71 y.o.   MRN: [id number]  Primary Cardiologist: Dr Radford Pax  General Surgeon: Dr Donne Hazel  Orthopedic: Dr Alvan Dame  PCP: Dr Justin Mend HF Cardiologist: Dr. Haroldine Laws  HPI: Rhonda [name] is a 71 y.o. 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 She has had sleep study with no  evidence of sleep apnea in remote past.  She has a Medtronic CRT-D device. Echo in 7/18 showed recovery of EF to 55-60%.   Has been followed by Narda Amber in Neurology. Has been on Florinef and midodrine for orthostatic hypotension (failed mestinon).   Saw Dr. Joelyn Oms recently and SCr slightly worse so Florinef stopped. Echo 1/23 EF 60-65%, RV ok  Follow up 2/23, SCr trending up and metolazone changed to every other Tuesday.   Admitted 5/23 with AKI. Resuscitated with IVF, Florinef and midodrine restarted. SCt stabilized at 1.6-1.8.  Today she returns for HF follow up with paramedicine [name]. Overall feeling fine. Gets around the house and does ADLs without significant dyspnea. Husband is essentially her caretaker. Denies palpitations, abnormal bleeding, CP, dizziness, edema, or PND/Orthopnea. Appetite ok. No fever or chills. Weight at home stable. Struggling to remember to take meds.   Cardiac Studies  - Echo (5/14): EF 35% RV ok  - Echo (11/14): EF 20-25%, LV moderately dilated - Echo (5/15): EF 45-50% - Echo (7/18): EF 55-60%, normal RV size and systolic function, PASP 34 mmHg  - PYP (4/19): negative TTR - Echo (1/21): EF 60-65% grade II DD. RV ok - Echo (1/23): EF 60-65%, RV ok  11/24/12 Creatinine 1.23  Potassium 4.1 12/15/12 Creatinine 2.03 Potassium 3.3  12/22/12 Creatinine 1.2 Potassium 3.8 02/23/13 Creatinine 1.68 K 4.4  05/02/13 K 4.2 Creatinine 1.8  10/11/13 K 3.7 Creatinine 1.69 10/17 K 4.1, creatinine 1.35 09/2017: K3.8 Creatinine 1.57  Review of systems complete and found to be negative unless listed in HPI.   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    Complication of anesthesia    History of low blood pressure after surgery; attributed to lying flat   Diabetes mellitus    "diet controlled" (05/03/2013)   Exertional shortness of breath    Generalized anxiety disorder    GERD (gastroesophageal reflux disease)    Gout    Heart murmur    Hyperlipidemia    Hypertension    Hypokalemia 11/08/2018   Hypothyroidism    Left bundle branch block    s/p CRT-D (04/2013)   Major depressive disorder    Migraines    On home oxygen therapy    "  2L suppose to be q night" (05/03/2013)   Orthostatic hypotension 07/28/2017   Peripheral neuropathy    Feet   SVD (spontaneous vaginal delivery)    x 2   Unspecified vitamin D deficiency 03/26/2011   Does not take meds   Current Outpatient Medications  Medication Sig Dispense Refill   acetaminophen (TYLENOL) 500 MG tablet Take 1,000 mg by mouth daily as needed (for pain and fever of 101 or greater).     acetaZOLAMIDE (DIAMOX) 125 MG tablet Take 1 tablet (125 mg total) by mouth daily. 90 tablet 3   carvedilol (COREG) 3.125 MG tablet TAKE 1 TABLET (3.125 MG) BY MOUTH TWICE  DAILY WITH MEALS 180 tablet 3   citalopram (CELEXA) 20 MG tablet Take 20 mg by mouth daily.     clonazePAM (KLONOPIN) 0.5 MG tablet Take 0.5 mg by mouth 2 (two) times daily as needed for anxiety.     cyanocobalamin (VITAMIN B12) 1000 MCG tablet Take 1,000 mcg by mouth daily.     dicyclomine (BENTYL) 20 MG tablet Take 20 mg by mouth 3 (three) times daily before meals.     fluticasone (FLONASE) 50 MCG/ACT nasal spray 1 spray in each nostril     gabapentin (NEURONTIN) 300 MG capsule Take 300 mg by mouth 2 (two) times daily.     levothyroxine (SYNTHROID) 88 MCG tablet TAKE 1 TABLET(88 MCG) BY MOUTH DAILY BEFORE BREAKFAST 90 tablet 3   metolazone (ZAROXOLYN) 2.5 MG tablet Take 1 tablet by mouth every other Tuesday. 12 tablet 3   midodrine (PROAMATINE) 10 MG tablet TAKE 1 TABLET THREE TIMES DAILY WITH MEALS 270 tablet 3   pantoprazole (PROTONIX) 40 MG tablet Take 40 mg by mouth daily as needed (for acid reflux).     potassium chloride SA (KLOR-CON M) 20 MEQ tablet Take 3 tablets (60 mEq total) by mouth 3 (three) times daily. 572 tablet 0   simvastatin (ZOCOR) 10 MG tablet Take 1 tablet (10 mg total) by mouth every evening. 30 tablet 11   spironolactone (ALDACTONE) 25 MG tablet TAKE 1 TABLET (25 MG TOTAL) BY MOUTH EVERY EVENING. 90 tablet 0   torsemide (DEMADEX) 20 MG tablet Take 4 tablets (80 mg total) by mouth every morning AND 3 tablets (60 mg total) every evening. 210 tablet 11   No current facility-administered medications for this encounter.   BP 120/72   Pulse 83   Wt 90.2 kg (198 lb 12.8 oz)   SpO2 98%   BMI 35.22 kg/m   Wt Readings from Last 3 Encounters:  01/07/22 90.2 kg (198 lb 12.8 oz)  01/05/22 89.4 kg (197 lb)  12/29/21 89.8 kg (198 lb)    PHYSICAL EXAM: General:  NAD. No resp difficulty, walked into clinic HEENT: Normal Neck: Supple. No JVD. Carotids 2+ bilat; no bruits. No lymphadenopathy or thryomegaly appreciated. Cor: PMI nondisplaced. Regular rate & rhythm. No rubs,  gallops or murmurs. Lungs: Clear Abdomen: Soft, nontender, nondistended. No hepatosplenomegaly. No bruits or masses. Good bowel sounds. Extremities: No cyanosis, clubbing, rash, edema; compression hose on Neuro: Alert & oriented x 3, cranial nerves grossly intact. Moves all 4 extremities w/o difficulty. Affect pleasant.  Device interrogation (personally reviewed): OptiVol down, thoracic impedence trending back down. No VT. No AF. Activity level < 0.3 hr/day.  ASSESSMENT & PLAN: 1) Chronic systolic HF with recovered EF/nonischemic cardiomyopathy:  - NICM s/p Medtronic CRT-D, ? Viral myocarditis. Cardiomyopathy dates from 2.  - EF 45-50% (09/2013)  - EF up to 55-60% on  2020.  - PYP 09/02/17 negative for TTR (Grade 0-1, H/CCL 1.2). SPEP with no M-spike.  - Echo 06/07/19: EF 60-65% grade II DD. RV ok.  - Echo 1/23 EF 60-65% RV normal.  - NYHA III. Volume status stable. - Continue torsemide 80 mg q am/60 mg q pm + metolazone every other Tuesday. - Recommended she return to [place] program at [facility]. - Continue carvedilol 3.125 mg bid. - Continue spironolactone 25 mg daily.  - Continue midodrine 10 mg tid. - No SGT2i with chronic yeast infections. - Wear compression stockings. - Labs today. - We discussed strategies to help her remember to take her medications. Paramedicine and husband helping.  2) Obesity - Body mass index is 35.22 kg/m. - Weight stable. - Encouraged PREP program.  3) HTN - Blood pressure well controlled.  - Continue current regimen.  4) Orthostatic Hypotension.  - Neurology managing. She is on diamox for increased ICP - Has been maintained on Florinef and midodrine 10 mg tid.  - Florinef stopped due to previous AKI, recently restarted.  5) Chronic Venous stasis - Compression stockings and leg elevation.   6) Falls  - No recent falls. - Improved. Seeing Neurology.   7) CKD 3 - Baseline SCr 1.4-1.6 - Recently 1.7-1.8  Follows with Dr. Joelyn Oms in Nephrology -  If Scr continues to climb can cut back metolazone   Follow up in 6 months with Dr. Haroldine Laws.  Maricela Bo Diarra Kos FNP-BC 4:01 PM

## 2022-01-07 NOTE — Telephone Encounter (Signed)
HF Paramedicine Team Based Care Meeting  HF MD- NA  HF NP - [place] NP-C   [facility] admit within the last 30 days for heart failure? no  Medications concerns? Trying to help simplify regimen to increase compliance  Education needs? Continued education on diet- usually has high sodium meals when she eats  SDOH - started therapy with Elias Else- stated first visit when well  Eligible for discharge? Will need long term follow up  [name], [facility] Desk#: [phone] Cell#: [phone]

## 2022-01-12 ENCOUNTER — Other Ambulatory Visit (HOSPITAL_COMMUNITY): Payer: Self-pay

## 2022-01-12 NOTE — Progress Notes (Signed)
Paramedicine Encounter    Patient ID: Rhonda [name], female    DOB: [birth year], 71 y.o.   MRN: [id number]   Arrived for home visit for Genell who reports feeling down and depressed today. She reports feeling lonely and struggling with getting out of bed, getting dressed, taking medications or eating a meal due to this. [name] and I talked at length today as she reports feeling this way because her husband works at [employer] from 12 noon to 12 midnight most days of the week and she gets very lonely and nervous being home alone. I suggested her reaching out to her daughters who live locally and she reports both of them are too busy to sit with her. She has an aide- [place] who comes out twice a week for a few hours at a time and a minister from her church who comes out once a week to sit and talk with her. She says she still feels lonely. I asked her if there were any neighbors, friends or other family she could count on and she reports there might be she just needs to ask. She says she plans on reaching out to a few and seeing if they are willing to come talk to her. I suggested to her and her husband talking to her PCP because she was taken off her Duloxetine back a few months ago and her Clonazepam was tapered down to as needed and since then she has been more anxious and depressed. She and her husband agreed to talk with Dr. [name] about this at her appointment on Thursday. I continued to offer encouragement and will continue to follow up and offer any support I can. She was grateful and felt better by the end of our visit.   I obtained vitals and assessment. No weight gain, no lower leg swelling, lungs clear. She denied chest pain, dizziness but does get short of breath during over exertion or anxiousness.    Vitals as noted: WT- 192.7lbs BP- 110/70 HR- 104 O2- 97% RR- 16  I reviewed meds and she had not yet taken her morning meds as of 1530 today. I gave her these and discussed the  importance of getting up and taking them on time daily. She agreed.   I filled pill box for one week. I placed the pill box in a safe place and placed her night meds in a med cup with a written reminder of what time to take it tonight. I reminded her husband to be sure to give her her morning meds before he leaves for work daily- he agreed.   Refills: Gabapentin( awaiting refills)   Home visit complete, I will see Guillermo in one week. She knows to reach out in the mean time if needed. Visit complete.   [name], EMT-Paramedic [phone] 01/12/2022      Patient Care Team: [name], MD as PCP - General (Family Medicine) [name], LCSW as Social Worker (Licensed Clinical Social Worker) [name], [name], DO as Consulting Physician (Neurology) [name], EMT as Paramedic [name] [name], MD as Attending Physician (Family Medicine)  Patient Active Problem List   Diagnosis Date Noted   Elevated troponin 12/02/2021   Nausea & vomiting 12/01/2021   Acute left ankle pain 10/11/2021   Intractable nausea and vomiting 10/08/2021   AKI (acute kidney injury) ([place]) 10/08/2021   Hyponatremia 10/08/2021   Decreased estrogen level 08/01/2021   Dementia ([place]) 08/01/2021   Frail elderly 08/01/2021  Hyperparathyroidism ([place]) 08/01/2021   Spondylolisthesis 08/01/2021   Varicose veins of bilateral lower extremities with other complications 05/27/7251   Lumbago without sciatica 04/30/2021   Abnormal gait 06/11/2020   Adult failure to thrive syndrome 06/11/2020   Allergic rhinitis 06/11/2020   Anxiety 06/11/2020   Asthma 06/11/2020   Benign intracranial hypertension 06/11/2020   Body mass index (BMI) 45.0-49.9, adult ([place]) 06/11/2020   Bowel incontinence 06/11/2020   Cholelithiasis 06/11/2020   Chronic sinusitis 06/11/2020   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   Daytime somnolence 06/11/2020   Diarrhea of presumed infectious origin 06/11/2020   Edema 06/11/2020    Family history of malignant neoplasm of gastrointestinal tract 06/11/2020   Gout 06/11/2020   History of fall 06/11/2020   History of infectious disease 06/11/2020   Insomnia 06/11/2020   Irregular bowel habits 06/11/2020   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   Lumbar spondylosis with myelopathy 06/11/2020   Macrocytosis 06/11/2020   Mild recurrent major depression ([place]) 06/11/2020   Personal history of colonic polyps 06/11/2020   Personal history of malignant neoplasm of breast 06/11/2020   Repeated falls 06/11/2020   Mixed anxiety and depressive disorder 06/11/2020   Irritable bowel syndrome 01/04/2020   Spinal stenosis of lumbar region 01/03/2020   Other symptoms and signs involving the musculoskeletal system 09/11/2019   Bilateral leg weakness 08/01/2019   Leg swelling 08/01/2019   Diabetes mellitus with neuropathy ([place]) 06/15/2019   Hypokalemia 11/08/2018   CKD (chronic kidney disease) stage 3, GFR 30-59 ml/min (HCC) 11/08/2018   Orthostatic hypotension 07/28/2017   SVD (spontaneous vaginal delivery)    Peripheral neuropathy    On home oxygen therapy    Migraines    Left bundle branch block    Hypothyroidism    Hypertension    Hyperlipidemia    Heart murmur    GERD (gastroesophageal reflux disease)    Exertional shortness of breath    Major depressive disorder    Back pain    Arthritis    Generalized anxiety disorder    Anemia    Chronic respiratory failure ([place]) 09/14/2013   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) 66/44/0347   Chronic systolic heart failure ([place]) 10/27/2012   Endometrial polyp 01/20/2012   Malignant tumor of breast ([place]) 03/26/2011   Unspecified vitamin D deficiency 03/26/2011    Current Outpatient Medications:    acetaminophen (TYLENOL) 500 MG tablet, Take 1,000 mg by mouth daily as needed (for pain and fever of 101 or greater)., Disp: , Rfl:    acetaZOLAMIDE (DIAMOX) 125 MG tablet, Take  1 tablet (125 mg total) by mouth daily., Disp: 90 tablet, Rfl: 3   carvedilol (COREG) 3.125 MG tablet, TAKE 1 TABLET (3.125 MG) BY MOUTH TWICE DAILY WITH MEALS, Disp: 180 tablet, Rfl: 3   citalopram (CELEXA) 20 MG tablet, Take 20 mg by mouth daily., Disp: , Rfl:    clonazePAM (KLONOPIN) 0.5 MG tablet, Take 0.5 mg by mouth 2 (two) times daily as needed for anxiety., Disp: , Rfl:    cyanocobalamin (VITAMIN B12) 1000 MCG tablet, Take 1,000 mcg by mouth daily., Disp: , Rfl:    dicyclomine (BENTYL) 20 MG tablet, Take 20 mg by mouth 3 (three) times daily before meals., Disp: , Rfl:    fluticasone (FLONASE) 50 MCG/ACT nasal spray, 1 spray in each nostril, Disp: , Rfl:    gabapentin (NEURONTIN) 300 MG capsule, Take 300 mg by mouth 2 (two) times daily., Disp: , Rfl:    levothyroxine (SYNTHROID)  88 MCG tablet, TAKE 1 TABLET(88 MCG) BY MOUTH DAILY BEFORE BREAKFAST, Disp: 90 tablet, Rfl: 3   metolazone (ZAROXOLYN) 2.5 MG tablet, Take 1 tablet by mouth every other Tuesday., Disp: 12 tablet, Rfl: 3   midodrine (PROAMATINE) 10 MG tablet, TAKE 1 TABLET THREE TIMES DAILY WITH MEALS, Disp: 270 tablet, Rfl: 3   pantoprazole (PROTONIX) 40 MG tablet, Take 40 mg by mouth daily as needed (for acid reflux)., Disp: , Rfl:    potassium chloride SA (KLOR-CON M) 20 MEQ tablet, Take 3 tablets (60 mEq total) by mouth 3 (three) times daily., Disp: 572 tablet, Rfl: 0   simvastatin (ZOCOR) 10 MG tablet, Take 1 tablet (10 mg total) by mouth every evening., Disp: 30 tablet, Rfl: 11   spironolactone (ALDACTONE) 25 MG tablet, TAKE 1 TABLET (25 MG TOTAL) BY MOUTH EVERY EVENING., Disp: 90 tablet, Rfl: 0   torsemide (DEMADEX) 20 MG tablet, Take 4 tablets (80 mg total) by mouth every morning AND 3 tablets (60 mg total) every evening., Disp: 210 tablet, Rfl: 11 Allergies  Allergen Reactions   Ceftin Anaphylaxis    Face and throat swell    Geodon [Ziprasidone Hcl] Hives   Lisinopril Other (See Comments)    angioedema   Cefuroxime      Other reaction(s): anaphylaxis   Sulfacetamide Sodium-Sulfur     Other reaction(s): itch   Allopurinol Nausea Only and Other (See Comments)    weakness   Ativan [Lorazepam] Itching   Sulfa Antibiotics Itching   Valium [Diazepam] Other (See Comments)    Patient states that diazepam doesn't relax, it has the opposite effect.     Social History   Socioeconomic History   Marital status: Married    Spouse name: Not on file   Number of children: 2   Years of education: 69   Highest education level: Master's degree (e.g., MA, MS, MEng, MEd, MSW, MBA)  Occupational History   Occupation: retired  Tobacco Use   Smoking status: Former    Packs/day: 0.10    Years: 26.00    Total pack years: 2.60    Types: Cigarettes    Quit date: 03/23/2021    Years since quitting: 0.8   Smokeless tobacco: Never  Vaping Use   Vaping Use: Never used  Substance and Sexual Activity   Alcohol use: No   Drug use: No   Sexual activity: Yes  Other Topics Concern   Not on file  Social History Narrative   Tobacco Use Cigarettes: Former Smoker, Quit in 2008   No Alcohol   No recreational drug use   Diet: Regular/Low Carb   Exercise: None   Occupation: disabled   Education: Research officer, political party, masters   Children: 2   Firearms: No   Therapist, art Use: 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      Social Determinants of Health   Financial Resource Strain: Not on file  Food Insecurity: Not on file  Transportation Needs: Not on file  Physical Activity: Not on file  Stress: Not on file  Social Connections: Not on file  Intimate Partner Violence: Not on file    Physical Exam      Future Appointments  Date Time Provider [place]  01/27/2022 10:55 AM [facility] DEVICE REMOTES [facility]  02/04/2022  1:00 PM Conception Chancy, PsyD [facility]  02/26/2022 11:30 AM [name], Tiffany L, DO [facility] None  03/04/2022  2:30 PM Marzetta Board,  DPM [facility]  03/11/2022  7:15 AM [facility] DEVICE REMOTES [facility]  04/01/2022  1:00 PM Hazle Coca, PhD [facility] None  04/01/2022  2:00 PM LBN- NEUROPSYCH TECH [facility] None  04/08/2022  2:30 PM Hazle Coca, PhD [facility] None  04/15/2022  8:50 AM Narda Amber K, DO [facility] None  06/10/2022  7:15 AM [facility] DEVICE REMOTES [facility]  09/09/2022  7:15 AM [facility] DEVICE REMOTES [facility]  12/09/2022  7:15 AM [facility] DEVICE REMOTES [facility]     ACTION: Home visit completed

## 2022-01-14 ENCOUNTER — Other Ambulatory Visit (HOSPITAL_COMMUNITY): Payer: Self-pay | Admitting: Internal Medicine

## 2022-01-15 DIAGNOSIS — Z743 Need for continuous supervision: Secondary | ICD-10-CM | POA: Diagnosis not present

## 2022-01-15 DIAGNOSIS — F331 Major depressive disorder, recurrent, moderate: Secondary | ICD-10-CM | POA: Diagnosis not present

## 2022-01-15 DIAGNOSIS — I10 Essential (primary) hypertension: Secondary | ICD-10-CM | POA: Diagnosis not present

## 2022-01-19 ENCOUNTER — Other Ambulatory Visit (HOSPITAL_COMMUNITY): Payer: Self-pay

## 2022-01-19 NOTE — Progress Notes (Signed)
Paramedicine Encounter    Patient ID: Rhonda [name], female    DOB: [birth year], 71 y.o.   MRN: [id number]   Arrived for home visit for Rhonda [name] who reports feeling okay and stating that she is experiencing headaches frequently but says Tylenol helps with this. She says she is having to take the Tylenol more often and has expressed this to her PCP in an office visit last week. She was prescribed a new medication from Dr. [name] last week during that visit for her depression which is Bupropion ER '150mg'$  daily. She began taking this today. I reviewed all meds and confirmed medications filling two pill boxes for two weeks worth of medication as next week will be a holiday.   No refills needed at this time.   She denied shortness of breath, chest pain, dizziness or swelling.   I obtained vitals: BP- 128/70 HR- 80 RR- 16 O2- 97  No lower leg swelling noted, lungs clear.   I reviewed upcoming appointments and confirmed. Home visit complete. I will see Rhonda [name] in two weeks.   [name], EMT-Paramedic [phone] 01/19/2022     Patient Care Team: [name], MD as PCP - General (Family Medicine) [name], LCSW as Social Worker (Licensed Clinical Social Worker) [name], [name], DO as Consulting Physician (Neurology) [name], EMT as Paramedic [name] [name], MD as Attending Physician (Family Medicine)  Patient Active Problem List   Diagnosis Date Noted   Elevated troponin 12/02/2021   Nausea & vomiting 12/01/2021   Acute left ankle pain 10/11/2021   Intractable nausea and vomiting 10/08/2021   AKI (acute kidney injury) ([place]) 10/08/2021   Hyponatremia 10/08/2021   Decreased estrogen level 08/01/2021   Dementia ([place]) 08/01/2021   Frail elderly 08/01/2021   Hyperparathyroidism ([place]) 08/01/2021   Spondylolisthesis 08/01/2021   Varicose veins of bilateral lower extremities with other complications 62/95/2841   Lumbago without sciatica  04/30/2021   Abnormal gait 06/11/2020   Adult failure to thrive syndrome 06/11/2020   Allergic rhinitis 06/11/2020   Anxiety 06/11/2020   Asthma 06/11/2020   Benign intracranial hypertension 06/11/2020   Body mass index (BMI) 45.0-49.9, adult ([place]) 06/11/2020   Bowel incontinence 06/11/2020   Cholelithiasis 06/11/2020   Chronic sinusitis 06/11/2020   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   Daytime somnolence 06/11/2020   Diarrhea of presumed infectious origin 06/11/2020   Edema 06/11/2020   Family history of malignant neoplasm of gastrointestinal tract 06/11/2020   Gout 06/11/2020   History of fall 06/11/2020   History of infectious disease 06/11/2020   Insomnia 06/11/2020   Irregular bowel habits 06/11/2020   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   Lumbar spondylosis with myelopathy 06/11/2020   Macrocytosis 06/11/2020   Mild recurrent major depression ([place]) 06/11/2020   Personal history of colonic polyps 06/11/2020   Personal history of malignant neoplasm of breast 06/11/2020   Repeated falls 06/11/2020   Mixed anxiety and depressive disorder 06/11/2020   Irritable bowel syndrome 01/04/2020   Spinal stenosis of lumbar region 01/03/2020   Other symptoms and signs involving the musculoskeletal system 09/11/2019   Bilateral leg weakness 08/01/2019   Leg swelling 08/01/2019   Diabetes mellitus with neuropathy ([place]) 06/15/2019   Hypokalemia 11/08/2018   CKD (chronic kidney disease) stage 3, GFR 30-59 ml/min (HCC) 11/08/2018   Orthostatic hypotension 07/28/2017   SVD (spontaneous vaginal delivery)    Peripheral neuropathy    On home oxygen therapy    Migraines    Left bundle branch block  Hypothyroidism    Hypertension    Hyperlipidemia    Heart murmur    GERD (gastroesophageal reflux disease)    Exertional shortness of breath    Major depressive disorder    Back pain    Arthritis    Generalized anxiety disorder    Anemia    Chronic respiratory failure ([place]) 09/14/2013   Biventricular  ICD (implantable cardioverter-defibrillator) in place 08/04/2013   Morbid obesity ([place]) 36/14/4315   Chronic systolic heart failure ([place]) 10/27/2012   Endometrial polyp 01/20/2012   Malignant tumor of breast ([place]) 03/26/2011   Unspecified vitamin D deficiency 03/26/2011    Current Outpatient Medications:    acetaminophen (TYLENOL) 500 MG tablet, Take 1,000 mg by mouth daily as needed (for pain and fever of 101 or greater)., Disp: , Rfl:    acetaZOLAMIDE (DIAMOX) 125 MG tablet, Take 1 tablet (125 mg total) by mouth daily., Disp: 90 tablet, Rfl: 3   carvedilol (COREG) 3.125 MG tablet, TAKE 1 TABLET (3.125 MG) BY MOUTH TWICE DAILY WITH MEALS, Disp: 180 tablet, Rfl: 3   citalopram (CELEXA) 20 MG tablet, Take 20 mg by mouth daily., Disp: , Rfl:    clonazePAM (KLONOPIN) 0.5 MG tablet, Take 0.5 mg by mouth 2 (two) times daily as needed for anxiety., Disp: , Rfl:    cyanocobalamin (VITAMIN B12) 1000 MCG tablet, Take 1,000 mcg by mouth daily., Disp: , Rfl:    dicyclomine (BENTYL) 20 MG tablet, Take 20 mg by mouth 3 (three) times daily before meals., Disp: , Rfl:    fluticasone (FLONASE) 50 MCG/ACT nasal spray, 1 spray in each nostril, Disp: , Rfl:    gabapentin (NEURONTIN) 300 MG capsule, Take 300 mg by mouth 2 (two) times daily., Disp: , Rfl:    levothyroxine (SYNTHROID) 88 MCG tablet, TAKE 1 TABLET(88 MCG) BY MOUTH DAILY BEFORE BREAKFAST, Disp: 90 tablet, Rfl: 3   metolazone (ZAROXOLYN) 2.5 MG tablet, Take 1 tablet by mouth every other Tuesday., Disp: 12 tablet, Rfl: 3   midodrine (PROAMATINE) 10 MG tablet, TAKE 1 TABLET THREE TIMES DAILY WITH MEALS, Disp: 270 tablet, Rfl: 3   pantoprazole (PROTONIX) 40 MG tablet, Take 40 mg by mouth daily as needed (for acid reflux)., Disp: , Rfl:    potassium chloride SA (KLOR-CON M) 20 MEQ tablet, TAKE 2 TABLETS THREE TIMES DAILY. TAKE AN EXTRA 2 TABLETS ON DAYS YOU TAKE METOLAZONE, Disp: 572 tablet, Rfl: 0   simvastatin (ZOCOR) 10 MG tablet, Take 1 tablet (10 mg  total) by mouth every evening., Disp: 30 tablet, Rfl: 11   spironolactone (ALDACTONE) 25 MG tablet, TAKE 1 TABLET (25 MG TOTAL) BY MOUTH EVERY EVENING., Disp: 90 tablet, Rfl: 0   torsemide (DEMADEX) 20 MG tablet, Take 4 tablets (80 mg total) by mouth every morning AND 3 tablets (60 mg total) every evening., Disp: 210 tablet, Rfl: 11 Allergies  Allergen Reactions   Ceftin Anaphylaxis    Face and throat swell    Geodon [Ziprasidone Hcl] Hives   Lisinopril Other (See Comments)    angioedema   Cefuroxime     Other reaction(s): anaphylaxis   Sulfacetamide Sodium-Sulfur     Other reaction(s): itch   Allopurinol Nausea Only and Other (See Comments)    weakness   Ativan [Lorazepam] Itching   Sulfa Antibiotics Itching   Valium [Diazepam] Other (See Comments)    Patient states that diazepam doesn't relax, it has the opposite effect.     Social History   Socioeconomic History   Marital  status: Married    Spouse name: Not on file   Number of children: 2   Years of education: 81   Highest education level: Master's degree (e.g., MA, MS, MEng, MEd, MSW, MBA)  Occupational History   Occupation: retired  Tobacco Use   Smoking status: Former    Packs/day: 0.10    Years: 26.00    Total pack years: 2.60    Types: Cigarettes    Quit date: 03/23/2021    Years since quitting: 0.8   Smokeless tobacco: Never  Vaping Use   Vaping Use: Never used  Substance and Sexual Activity   Alcohol use: No   Drug use: No   Sexual activity: Yes  Other Topics Concern   Not on file  Social History Narrative   Tobacco Use Cigarettes: Former Smoker, Quit in 2008   No Alcohol   No recreational drug use   Diet: Regular/Low Carb   Exercise: None   Occupation: disabled   Education: Research officer, political party, masters   Children: 2   Firearms: No   Therapist, art Use: 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      Social Determinants of Health   Financial Resource Strain: Not on file  Food  Insecurity: Not on file  Transportation Needs: Not on file  Physical Activity: Not on file  Stress: Not on file  Social Connections: Not on file  Intimate Partner Violence: Not on file    Physical Exam      Future Appointments  Date Time Provider [place]  01/27/2022 10:55 AM [facility] DEVICE REMOTES [facility]  02/04/2022  1:00 PM Conception Chancy, PsyD [facility]  02/26/2022 11:30 AM [name], Tiffany L, DO [facility] None  03/04/2022  2:30 PM Marzetta Board, DPM [facility]  03/11/2022  7:15 AM [facility] DEVICE REMOTES [facility]  04/01/2022  1:00 PM Hazle Coca, PhD [facility] None  04/01/2022  2:00 PM LBN- [place] [facility] None  04/08/2022  2:30 PM Hazle Coca, PhD [facility] None  04/15/2022  8:50 AM Narda Amber K, DO [facility] None  06/10/2022  7:15 AM [facility] DEVICE REMOTES [facility]  09/09/2022  7:15 AM [facility] DEVICE REMOTES [facility]  12/09/2022  7:15 AM [facility] DEVICE REMOTES [facility]     ACTION: Home visit completed

## 2022-01-20 ENCOUNTER — Encounter: Payer: Self-pay | Admitting: Internal Medicine

## 2022-01-20 ENCOUNTER — Telehealth (HOSPITAL_COMMUNITY): Payer: Self-pay

## 2022-01-20 NOTE — Telephone Encounter (Signed)
Received a phone call from RN at Mrs. Nicol's PCP office informing me of some abnormal labs in which Dr. [name] (PCP) felt the patient was dehydrated and wanted to make the HF clinic aware and suggested adjusting her diuretics. I requested the nurse send the labs via fax to the HF clinic and I would make the triage team aware for them to have the provider review same and advise on med adjustment. Enid Derry, RN at [facility] reports she will fax labs today for them to review. I advised her I would inform her if any med changes were made and follow up with the patient accordingly. Call complete.   -call routed to triage.   [name], [place] 01/20/2022

## 2022-01-21 NOTE — Telephone Encounter (Signed)
Labs received and reviewed by [name], NP No medication changes are needed at this time. Repeat bmet in 2 weeks   [name], EMT/ParaMedicine aware and will assist with appt scheduling at next home visit.

## 2022-01-22 ENCOUNTER — Other Ambulatory Visit: Payer: Medicare HMO

## 2022-01-22 DIAGNOSIS — Z515 Encounter for palliative care: Secondary | ICD-10-CM

## 2022-01-25 NOTE — Progress Notes (Signed)
SOCIAL WORK TELEPHONIC ENCOUNTER  PC SW completed a telephonic visit with patient's husband to provide resources and support to him. He is requesting information regarding in-home assistance. He acknowledged that he received the information packet that included in-home care agencies SW mailed to him. He stated that he misplaced the information. He requested that SW mail it to him again. SW to follow-up. SW reiterated to him that he should contact the in-home care program through [facility], as patient is already receiving those services and discuss with them the increased care needs patient has. He advised that patient is increasingly forgetful. He has unplugged the stove so that patient will not attempt to use it. He express his desire to maintain patient in the home. SW reinforced access to support and encouraged him to call with any additional questions or concerns.

## 2022-01-27 ENCOUNTER — Ambulatory Visit (INDEPENDENT_AMBULATORY_CARE_PROVIDER_SITE_OTHER): Payer: Medicare HMO

## 2022-01-27 DIAGNOSIS — Z95810 Presence of automatic (implantable) cardiac defibrillator: Secondary | ICD-10-CM

## 2022-01-27 DIAGNOSIS — I5022 Chronic systolic (congestive) heart failure: Secondary | ICD-10-CM

## 2022-01-29 ENCOUNTER — Other Ambulatory Visit (HOSPITAL_COMMUNITY): Payer: Self-pay | Admitting: Internal Medicine

## 2022-01-29 ENCOUNTER — Other Ambulatory Visit (HOSPITAL_COMMUNITY): Payer: Self-pay

## 2022-01-29 NOTE — Progress Notes (Signed)
Paramedicine Encounter    Patient ID: Rhonda [name], female    DOB: [birth year], 71 y.o.   MRN: [id number]  Martin Majestic out to see Rhonda [name] at her request today as she called to report she felt she was taking her medications wrong. I arrived and reviewed pill boxes. She has successfully been taking them correctly. Her husband has been giving her her medications over the last week. I  was able to verify same and fill pill box for the remainder of the week. I set up her lab appointment with the HF clinic for next Wednesday and I plan to follow up in the home on Thursday next week. Her and her husband agreed with plan. Her husband reports to me that they are working on getting some in home care established for Rhonda [name] and that he has called DSS and left a message for their case manager to reach out to him to assist. I advised him if they need any assistance to let me know. PCP is involved with this as well. Rhonda [name] was prescribed Wellbutrin over a week ago and reports she feels "on edge and short tempered". I advised her this is a normal side effect and to give it time to work. She agreed with plan and felt better after talking during our check in. I will see her in the home in one week.   Refill called in to RN @ PCP for Clonazepam as [facility] reports they need a new RX.   [name], [place] 01/29/2022   ACTION: Home visit completed

## 2022-01-30 NOTE — Progress Notes (Signed)
EPIC Encounter for ICM Monitoring  Patient Name: Rhonda [name] is a 71 y.o. female Date: 01/30/2022 Primary Care Physican: [name], MD Primary Cardiologist: Bensimhon Electrophysiologist: Santina Evans Pacing: 96.8%   12/15/2021  Weight: 208 lbs          Transmission reviewed.   Optivol thoracic impedance suggesting fluctuations between possible dryness and possible fluid accumulation in last month.    Prescribed:  Patient participating in paramedicine program to assist with meds. Torsemide 20 mg 4 tablets (80 mg total) by mouth every morning and 3 tablets (60 mg total) every evening.   Potassium 20 meq 3 tablets (60 mEq total) by mouth 3 (three) times daily.  Take extra 2 tablets on days you take Metolazone.   Metolazone 2.'5mg'$  1 tablet every other Tuesday. Spironolactone 25 mg take 1 tablet by mouth every evening.   Labs: 12/03/2021 Creatinine 1.33, BUN 33, Potassium 4.3, Sodium 133, GFR 43 12/02/2021 Creatinine 1.68, BUN 49, Potassium 4.3, Sodium 137, GFR 33  12/01/2021 Creatinine 1.82, BUN 52, Potassium 3.0, Sodium 133, GFR 30  11/12/2021 Creatinine 1.90, BUN 31, Potassium 3.3, Sodium 132, GFR 28 10/10/2021 Creatinine 1.11, BUN 18, Potassium 3.7, Sodium 140, GFR 54 10/09/2021 Creatinine 1.47, BUN 33, Potassium 3.8, Sodium 136, GFR 39  10/08/2021 Creatinine 2.05, BUN 36, Potassium 4.3, Sodium 133, GFR 26  10/06/2021 Creatinine 2.04, BUN 42, Potassium 5.0, Sodium 134, GFR 26  10/05/2021 Creatinine 1.78, BUN 34, Potassium 3.6, Sodium 134, GFR 31  09/04/2021 Creatinine 1.66, BUN 41, Potassium 3.7, Sodium 135, GFR 33 A complete set of results can be found in Results Review.   Recommendations:  No changes.   Follow-up plan: ICM clinic phone appointment on 03/09/2022.   91 day device clinic remote transmission 03/11/2022.     EP/Cardiology Office Visits:   Recall 07/06/2022 with Dr Haroldine Laws.  Recall 07/26/2021 with Dr Lovena Le.     Copy of ICM check sent to Dr. Lovena Le.  3  month ICM trend: 01/27/2022.    12-14 Month ICM trend:     [name], RN 01/30/2022 9:14 AM

## 2022-02-04 ENCOUNTER — Ambulatory Visit: Payer: Medicare HMO | Admitting: Psychologist

## 2022-02-04 ENCOUNTER — Ambulatory Visit (HOSPITAL_COMMUNITY)
Admission: RE | Admit: 2022-02-04 | Discharge: 2022-02-04 | Disposition: A | Discharge status: Home / Self Care | Payer: Medicare HMO | Source: Ambulatory Visit | Attending: Internal Medicine | Admitting: Internal Medicine

## 2022-02-04 DIAGNOSIS — F330 Major depressive disorder, recurrent, mild: Secondary | ICD-10-CM | POA: Diagnosis not present

## 2022-02-04 DIAGNOSIS — I5022 Chronic systolic (congestive) heart failure: Secondary | ICD-10-CM | POA: Insufficient documentation

## 2022-02-04 LAB — BASIC METABOLIC PANEL
Anion gap: 10 (ref 5–15)
BUN: 18 mg/dL (ref 8–23)
CO2: 21 mmol/L — ABNORMAL LOW (ref 22–32)
Calcium: 9.1 mg/dL (ref 8.9–10.3)
Chloride: 105 mmol/L (ref 98–111)
Creatinine, Ser: 1.71 mg/dL — ABNORMAL HIGH (ref 0.44–1.00)
GFR, Estimated: 32 mL/min — ABNORMAL LOW (ref >60)
Glucose, Bld: 147 mg/dL — ABNORMAL HIGH (ref 70–99)
Potassium: 4.4 mmol/L (ref 3.5–5.1)
Sodium: 136 mmol/L (ref 135–145)

## 2022-02-04 NOTE — Progress Notes (Signed)
                Rhonda Wisdom, PsyD 

## 2022-02-04 NOTE — Progress Notes (Signed)
[facility] Counselor/Therapist Progress Note  Patient ID: Rhonda [name], MRN: [id number],    Date: 02/04/2022  Time Spent: 01:08 pm to 01:40 pm; total time: 32 minutes   This session was held via in person. The patient consented to in-person therapy and was in the clinician's office. Limits of confidentiality were discussed with the patient.   Treatment Type: Individual Therapy  Reported Symptoms: Depression  Mental Status Exam: Appearance:  Well Groomed     Behavior: Appropriate  Motor: Normal  Speech/Language:  Clear and Coherent  Affect: Appropriate  Mood: normal  Thought process: normal  Thought content:   WNL  Sensory/Perceptual disturbances:   WNL  Orientation: oriented to person, place, and time/date  Attention: Fair  Concentration: Fair  Memory: Immediate;   Poor  Fund of knowledge:  Fair  Insight:   Poor  Judgment:  Poor  Impulse Control: Good   Risk Assessment: Danger to Self:  No Self-injurious Behavior: No Danger to Others: No Duty to Warn:no Physical Aggression / Violence:No  Access to Firearms a concern: No  Gang Involvement:No   Subjective: Beginning the session patient and spouse participated in the session, with spouse asking for additional in-home assistance.  Patient then voiced some frustrations with concern over food in the home or concern of forgetting about food in the home. She was hesitant to explore ways to set reminders about food. Spouse stated that he has unplugged the oven out of concern for safety. Both processed thoughts. Patient agreed to follow up. She denied suicidal and homicidal ideation.   Interventions:  Worked on developing a therapeutic relationship with the patient using active listening and reflective statements. Provided emotional support using empathy and validation. Reviewed the treatment plan with the patient. Reviewed events since the intake. Processed thoughts and emotions. Explores some of the challenges  that were expressed. Validated spouse's concerns. Processed some of the challenges surrounding food. Attempted to assist in problem solving. Informed spouse that clinician would contact social work. Provided empathic statements. Assessed for suicidal and homicidal ideation.  Homework: NA  Next Session: Emotional support.   Diagnosis: F33.0 major depressive affective disorder, recurrent, mild   Plan:  Goals Work through the grieving process and face reality of own death Accept emotional support from others around them Live life to the fullest, event though time may be limited Become as knowledgeable about the medical condition  Reduce fear, anxiety about the health condition  Accept the illness Accept the role of psychological and behavioral factors  Stabilize anxiety level wile increasing ability to function Learn and implement coping skills that result in a reduction of anxiety  Alleviate depressive symptoms Recognize, accept, and cope with depressive feelings Develop healthy thinking patterns Develop healthy interpersonal relationships  Objectives target date for all objectives is 12/25/2022 Identify feelings associated with the illness Family members share with each other feelings Identify the losses or limitations that have been experienced Verbalize acceptance of the reality of the medical condition Commit to learning and implement a proactive approach to managing personal stresses Verbalize an understanding of the medical condition Work with therapist to develop a plan for coping with stress Learn and implement skills for managing stress Engage in social, productive activities that are possible Engage in faith based activities implement positive imagery Identify coping skills and sources of emotional support Patient's partner and family members verbalize their fears regarding severity of health condition Identify sources of emotional distress  Learning and implement calming  skills to reduce overall anxiety  Learn and implement problem solving strategies Identify and engage in pleasant activities Learning and implement personal and interpersonal skills to reduce anxiety and improve interpersonal relationships Learn to accept limitations in life and commit to tolerating, rather than avoiding, unpleasant emotions while accomplishing meaningful goals Identify major life conflicts from the past and present that form the basis for present anxiety Learn and implement behavioral strategies Verbalize an understanding and resolution of current interpersonal problems Learn and implement problem solving and decision making skills Learn and implement conflict resolution skills to resolve interpersonal problems Verbalize an understanding of healthy and unhealthy emotions verbalize insight into how past relationships may be influence current experiences with depression Use mindfulness and acceptance strategies and increase value based behavior  Increase hopeful statements about the future.   Interventions Teach about stress and ways to handle stress Assist the patient in developing a coping action plan for stressors Conduct skills based training for coping strategies Train problem focused skills Sort out what activities the individual can do Encourage patient to rely upon his/her spiritual faith Teach the patient to use guided imagery Probe and evaluate family's ability to provide emotional support Allow family to share their fears Assist the patient in identifying, sorting through, and verbalizing the various feelings generated by his/her medical condition Meet with family members  Ask patient list out limitations  Use stress inoculation training  Use Acceptance and Commitment Therapy to help client accept uncomfortable realities in order to accomplish value-consistent goals Reinforce the client's insight into the role of his/her past emotional pain and present anxiety   Discuss examples demonstrating that unrealistic worry overestimates the probability of threats and underestimate patient's ability  Assist the patient in analyzing his or her worries Help patient understand that avoidance is reinforcing  Behavioral activation help the client explore the relationship, nature of the dispute,  Help the client develop new interpersonal skills and relationships Conduct Problem so living therapy Teach conflict resolution skills Use a process-experiential approach Conduct TLDP Conduct ACT  The patient, patient's spouse and clinician reviewed the treatment plan on 02/04/2022. The patient and spouse approved of the treatment plan.    Conception Chancy, PsyD

## 2022-02-05 ENCOUNTER — Telehealth (HOSPITAL_COMMUNITY): Payer: Self-pay

## 2022-02-05 ENCOUNTER — Other Ambulatory Visit (HOSPITAL_COMMUNITY): Payer: Self-pay

## 2022-02-05 NOTE — Progress Notes (Signed)
Paramedicine Encounter    Patient ID: Rhonda [name], female    DOB: Jun 17, 1950, 71 y.o.   MRN: [id number]  Arrived for home visit for Rhonda [name] who reports feeling good today. She reports that this week she has felt better than weeks past. She has not missed any doses of her medications this week. Her husband has been staying on top of giving her, her daily medications. I reviewed all meds and confirmed same filling pill box for one week. No refills needed at this time.   She denied shortness of breath more than normal, dizziness more than normal, chest pain. No lower leg swelling. Lungs clear.   Rhonda [name] reports she wants to try PREP Program again. I will reach out to [facility] with PREP.   She has had visits with [place]. and reports it is going well.   We reviewed HF management and diet. I encouraged her to try to exercise just around the house- she verbalized understanding.   Home visit complete.   [name], EMT-Paramedic [phone] 02/05/2022   Patient Care Team: [name], MD as PCP - General (Family Medicine) [name], LCSW as Social Worker (Licensed Clinical Social Worker) [name], [name], DO as Consulting Physician (Neurology) [name], EMT as Paramedic [name] [name], MD as Attending Physician (Family Medicine)  Patient Active Problem List   Diagnosis Date Noted   Elevated troponin 12/02/2021   Nausea & vomiting 12/01/2021   Acute left ankle pain 10/11/2021   Intractable nausea and vomiting 10/08/2021   AKI (acute kidney injury) ([place]) 10/08/2021   Hyponatremia 10/08/2021   Decreased estrogen level 08/01/2021   Dementia ([place]) 08/01/2021   Frail elderly 08/01/2021   Hyperparathyroidism ([place]) 08/01/2021   Spondylolisthesis 08/01/2021   Varicose veins of bilateral lower extremities with other complications 16/38/4665   Lumbago without sciatica 04/30/2021   Abnormal gait 06/11/2020   Adult failure to thrive syndrome 06/11/2020    Allergic rhinitis 06/11/2020   Anxiety 06/11/2020   Asthma 06/11/2020   Benign intracranial hypertension 06/11/2020   Body mass index (BMI) 45.0-49.9, adult ([place]) 06/11/2020   Bowel incontinence 06/11/2020   Cholelithiasis 06/11/2020   Chronic sinusitis 06/11/2020   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   Daytime somnolence 06/11/2020   Diarrhea of presumed infectious origin 06/11/2020   Edema 06/11/2020   Family history of malignant neoplasm of gastrointestinal tract 06/11/2020   Gout 06/11/2020   History of fall 06/11/2020   History of infectious disease 06/11/2020   Insomnia 06/11/2020   Irregular bowel habits 06/11/2020   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   Lumbar spondylosis with myelopathy 06/11/2020   Macrocytosis 06/11/2020   Mild recurrent major depression ([place]) 06/11/2020   Personal history of colonic polyps 06/11/2020   Personal history of malignant neoplasm of breast 06/11/2020   Repeated falls 06/11/2020   Mixed anxiety and depressive disorder 06/11/2020   Irritable bowel syndrome 01/04/2020   Spinal stenosis of lumbar region 01/03/2020   Other symptoms and signs involving the musculoskeletal system 09/11/2019   Bilateral leg weakness 08/01/2019   Leg swelling 08/01/2019   Diabetes mellitus with neuropathy ([place]) 06/15/2019   Hypokalemia 11/08/2018   CKD (chronic kidney disease) stage 3, GFR 30-59 ml/min (HCC) 11/08/2018   Orthostatic hypotension 07/28/2017   SVD (spontaneous vaginal delivery)    Peripheral neuropathy    On home oxygen therapy    Migraines    Left bundle branch block    Hypothyroidism    Hypertension    Hyperlipidemia    Heart  murmur    GERD (gastroesophageal reflux disease)    Exertional shortness of breath    Major depressive disorder    Back pain    Arthritis    Generalized anxiety disorder    Anemia    Chronic respiratory failure ([place]) 09/14/2013   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   Morbid obesity ([place])  43/15/4008   Chronic systolic heart failure ([place]) 10/27/2012   Endometrial polyp 01/20/2012   Malignant tumor of breast ([place]) 03/26/2011   Unspecified vitamin D deficiency 03/26/2011    Current Outpatient Medications:    acetaminophen (TYLENOL) 500 MG tablet, Take 1,000 mg by mouth daily as needed (for pain and fever of 101 or greater)., Disp: , Rfl:    acetaZOLAMIDE (DIAMOX) 125 MG tablet, Take 1 tablet (125 mg total) by mouth daily., Disp: 90 tablet, Rfl: 3   buPROPion (WELLBUTRIN XL) 150 MG 24 hr tablet, Take 150 mg by mouth daily., Disp: , Rfl:    carvedilol (COREG) 3.125 MG tablet, TAKE 1 TABLET (3.125 MG) BY MOUTH TWICE DAILY WITH MEALS, Disp: 180 tablet, Rfl: 3   citalopram (CELEXA) 20 MG tablet, Take 20 mg by mouth daily., Disp: , Rfl:    clonazePAM (KLONOPIN) 0.5 MG tablet, Take 0.5 mg by mouth 2 (two) times daily as needed for anxiety., Disp: , Rfl:    cyanocobalamin (VITAMIN B12) 1000 MCG tablet, Take 1,000 mcg by mouth daily., Disp: , Rfl:    dicyclomine (BENTYL) 20 MG tablet, Take 20 mg by mouth 3 (three) times daily before meals., Disp: , Rfl:    fluticasone (FLONASE) 50 MCG/ACT nasal spray, 1 spray in each nostril, Disp: , Rfl:    gabapentin (NEURONTIN) 300 MG capsule, Take 300 mg by mouth 2 (two) times daily., Disp: , Rfl:    levothyroxine (SYNTHROID) 88 MCG tablet, TAKE 1 TABLET(88 MCG) BY MOUTH DAILY BEFORE BREAKFAST, Disp: 90 tablet, Rfl: 3   metolazone (ZAROXOLYN) 2.5 MG tablet, Take 1 tablet by mouth every other Tuesday., Disp: 12 tablet, Rfl: 3   midodrine (PROAMATINE) 10 MG tablet, TAKE 1 TABLET THREE TIMES DAILY WITH MEALS, Disp: 270 tablet, Rfl: 3   pantoprazole (PROTONIX) 40 MG tablet, Take 40 mg by mouth daily as needed (for acid reflux)., Disp: , Rfl:    potassium chloride SA (KLOR-CON M) 20 MEQ tablet, TAKE 2 TABLETS THREE TIMES DAILY. TAKE AN EXTRA 2 TABLETS ON DAYS YOU TAKE METOLAZONE, Disp: 572 tablet, Rfl: 0   simvastatin (ZOCOR) 10 MG tablet, Take 1 tablet (10  mg total) by mouth every evening., Disp: 30 tablet, Rfl: 11   spironolactone (ALDACTONE) 25 MG tablet, TAKE 1 TABLET (25 MG TOTAL) BY MOUTH EVERY EVENING., Disp: 90 tablet, Rfl: 0   torsemide (DEMADEX) 20 MG tablet, Take 4 tablets (80 mg total) by mouth every morning AND 3 tablets (60 mg total) every evening., Disp: 210 tablet, Rfl: 11 Allergies  Allergen Reactions   Ceftin Anaphylaxis    Face and throat swell    Geodon [Ziprasidone Hcl] Hives   Lisinopril Other (See Comments)    angioedema   Cefuroxime     Other reaction(s): anaphylaxis   Sulfacetamide Sodium-Sulfur     Other reaction(s): itch   Allopurinol Nausea Only and Other (See Comments)    weakness   Ativan [Lorazepam] Itching   Sulfa Antibiotics Itching   Valium [Diazepam] Other (See Comments)    Patient states that diazepam doesn't relax, it has the opposite effect.     Social History  Socioeconomic History   Marital status: Married    Spouse name: Not on file   Number of children: 2   Years of education: 42   Highest education level: Master's degree (e.g., MA, MS, MEng, MEd, MSW, MBA)  Occupational History   Occupation: retired  Tobacco Use   Smoking status: Former    Packs/day: 0.10    Years: 26.00    Total pack years: 2.60    Types: Cigarettes    Quit date: 03/23/2021    Years since quitting: 0.8   Smokeless tobacco: Never  Vaping Use   Vaping Use: Never used  Substance and Sexual Activity   Alcohol use: No   Drug use: No   Sexual activity: Yes  Other Topics Concern   Not on file  Social History Narrative   Tobacco Use Cigarettes: Former Smoker, Quit in 2008   No Alcohol   No recreational drug use   Diet: Regular/Low Carb   Exercise: None   Occupation: disabled   Education: Research officer, political party, masters   Children: 2   Firearms: No   Therapist, art Use: 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      Social Determinants of Health   Financial Resource Strain: Not on file  Food  Insecurity: Not on file  Transportation Needs: Not on file  Physical Activity: Not on file  Stress: Not on file  Social Connections: Not on file  Intimate Partner Violence: Not on file    Physical Exam      Future Appointments  Date Time Provider [place]  02/25/2022  1:00 PM Conception Chancy, PsyD [facility]  02/26/2022 11:30 AM [name], Tiffany L, DO [facility] None  03/04/2022  2:30 PM Marzetta Board, DPM [facility]  03/09/2022  7:10 AM [facility] DEVICE REMOTES [facility]  03/11/2022  7:15 AM [facility] DEVICE REMOTES [facility]  04/01/2022  1:00 PM Hazle Coca, PhD [facility] None  04/01/2022  2:00 PM [facility] None  04/08/2022  2:30 PM Hazle Coca, PhD [facility] None  04/15/2022  8:50 AM Narda Amber K, DO [facility] None  06/10/2022  7:15 AM [facility] DEVICE REMOTES [facility]  09/09/2022  7:15 AM [facility] DEVICE REMOTES [facility]  12/09/2022  7:15 AM [facility] DEVICE REMOTES [facility]     ACTION: Home visit completed

## 2022-02-05 NOTE — Telephone Encounter (Signed)
Rhonda [name] requesting to restart PREP Program- I will be reaching out to [facility] for same.   Rhonda [name], [place] 02/05/2022

## 2022-02-07 DIAGNOSIS — N1832 Chronic kidney disease, stage 3b: Secondary | ICD-10-CM | POA: Diagnosis not present

## 2022-02-07 DIAGNOSIS — I5032 Chronic diastolic (congestive) heart failure: Secondary | ICD-10-CM | POA: Diagnosis not present

## 2022-02-07 DIAGNOSIS — F0283 Dementia in other diseases classified elsewhere, unspecified severity, with mood disturbance: Secondary | ICD-10-CM | POA: Diagnosis not present

## 2022-02-07 DIAGNOSIS — E039 Hypothyroidism, unspecified: Secondary | ICD-10-CM | POA: Diagnosis not present

## 2022-02-07 DIAGNOSIS — I130 Hypertensive heart and chronic kidney disease with heart failure and stage 1 through stage 4 chronic kidney disease, or unspecified chronic kidney disease: Secondary | ICD-10-CM | POA: Diagnosis not present

## 2022-02-07 DIAGNOSIS — E1122 Type 2 diabetes mellitus with diabetic chronic kidney disease: Secondary | ICD-10-CM | POA: Diagnosis not present

## 2022-02-07 DIAGNOSIS — F331 Major depressive disorder, recurrent, moderate: Secondary | ICD-10-CM | POA: Diagnosis not present

## 2022-02-07 DIAGNOSIS — F02818 Dementia in other diseases classified elsewhere, unspecified severity, with other behavioral disturbance: Secondary | ICD-10-CM | POA: Diagnosis not present

## 2022-02-07 DIAGNOSIS — I429 Cardiomyopathy, unspecified: Secondary | ICD-10-CM | POA: Diagnosis not present

## 2022-02-09 ENCOUNTER — Telehealth: Payer: Self-pay

## 2022-02-09 NOTE — Telephone Encounter (Signed)
Call to pt reference PREP interest Left message for pt to call back with female who answered phone.

## 2022-02-09 NOTE — Telephone Encounter (Signed)
Husband returned call with pt on speaker.  Confirmed she would like to do PREP.  Has a referral for physical therapy. Will do that first. Next available PREP class will be November. Advised will call them back closer to start of class

## 2022-02-12 ENCOUNTER — Other Ambulatory Visit (HOSPITAL_COMMUNITY): Payer: Self-pay

## 2022-02-12 NOTE — Progress Notes (Signed)
Paramedicine Encounter    Patient ID: Rhonda [name], female    DOB: 1950/07/14, 71 y.o.   MRN: [id number]  Arrived for home visit for Rhonda [name] who reports feeling good with no complaints reporting she is doing okay this week. She has been 100% compliant with all medications over the last week. Her husband and her have worked out a system and it is working well with improving her med compliance. Her appetite has reportedly improved. Her weight is up 4 lbs this week but no swelling or signs of volume overload noted. Lungs clear, no lower leg swelling or edema. Vitals obtained as noted. She reports her normal shortness of breath when walking around her home but states it has not gotten worse. She is starting home PT with [facility] ordered by PCP this week.  I reviewed meds and confirmed same filling pill box for one week.   Refills: Spironolactone   We reviewed upcoming appointments and confirmed same. I will see Monalisa in one week.   [name], EMT-Paramedic [phone] 02/12/2022   Patient Care Team: [name], MD as PCP - General (Family Medicine) [name], LCSW as Social Worker (Licensed Clinical Social Worker) [name], [name], DO as Consulting Physician (Neurology) [name], EMT as Paramedic [name] [name], MD as Attending Physician (Family Medicine)  Patient Active Problem List   Diagnosis Date Noted   Elevated troponin 12/02/2021   Nausea & vomiting 12/01/2021   Acute left ankle pain 10/11/2021   Intractable nausea and vomiting 10/08/2021   AKI (acute kidney injury) ([place]) 10/08/2021   Hyponatremia 10/08/2021   Decreased estrogen level 08/01/2021   Dementia ([place]) 08/01/2021   Frail elderly 08/01/2021   Hyperparathyroidism ([place]) 08/01/2021   Spondylolisthesis 08/01/2021   Varicose veins of bilateral lower extremities with other complications 03/70/4888   Lumbago without sciatica 04/30/2021   Abnormal gait 06/11/2020   Adult failure to  thrive syndrome 06/11/2020   Allergic rhinitis 06/11/2020   Anxiety 06/11/2020   Asthma 06/11/2020   Benign intracranial hypertension 06/11/2020   Body mass index (BMI) 45.0-49.9, adult ([place]) 06/11/2020   Bowel incontinence 06/11/2020   Cholelithiasis 06/11/2020   Chronic sinusitis 06/11/2020   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   Daytime somnolence 06/11/2020   Diarrhea of presumed infectious origin 06/11/2020   Edema 06/11/2020   Family history of malignant neoplasm of gastrointestinal tract 06/11/2020   Gout 06/11/2020   History of fall 06/11/2020   History of infectious disease 06/11/2020   Insomnia 06/11/2020   Irregular bowel habits 06/11/2020   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) 06/15/2019   Hypokalemia 11/08/2018   CKD (chronic kidney disease) stage 3, GFR 30-59 ml/min (HCC) 11/08/2018   Orthostatic hypotension 07/28/2017   SVD (spontaneous vaginal delivery)    Peripheral neuropathy    On home oxygen therapy    Migraines    Left bundle branch block    Hypothyroidism    Hypertension    Hyperlipidemia    Heart  murmur    GERD (gastroesophageal reflux disease)    Exertional shortness of breath    Major depressive disorder    Back pain    Arthritis    Generalized anxiety disorder    Anemia    Chronic respiratory failure ([place]) 09/14/2013   Biventricular ICD (implantable cardioverter-defibrillator) in place  08/04/2013   Morbid obesity ([place]) 64/40/3474   Chronic systolic heart failure ([place]) 10/27/2012   Endometrial polyp 01/20/2012   Malignant tumor of breast ([place]) 03/26/2011   Unspecified vitamin D deficiency 03/26/2011    Current Outpatient Medications:    acetaminophen (TYLENOL) 500 MG tablet, Take 1,000 mg by mouth daily as needed (for pain and fever of 101 or greater)., Disp: , Rfl:    acetaZOLAMIDE (DIAMOX) 125 MG tablet, Take 1 tablet (125 mg total) by mouth daily., Disp: 90 tablet, Rfl: 3   buPROPion (WELLBUTRIN XL) 150 MG 24 hr tablet, Take 150 mg by mouth daily., Disp: , Rfl:    carvedilol (COREG) 3.125 MG tablet, TAKE 1 TABLET (3.125 MG) BY MOUTH TWICE DAILY WITH MEALS, Disp: 180 tablet, Rfl: 3   citalopram (CELEXA) 20 MG tablet, Take 20 mg by mouth daily., Disp: , Rfl:    clonazePAM (KLONOPIN) 0.5 MG tablet, Take 0.5 mg by mouth 2 (two) times daily as needed for anxiety., Disp: , Rfl:    cyanocobalamin (VITAMIN B12) 1000 MCG tablet, Take 1,000 mcg by mouth daily., Disp: , Rfl:    dicyclomine (BENTYL) 20 MG tablet, Take 20 mg by mouth 3 (three) times daily before meals., Disp: , Rfl:    fluticasone (FLONASE) 50 MCG/ACT nasal spray, 1 spray in each nostril, Disp: , Rfl:    gabapentin (NEURONTIN) 300 MG capsule, Take 300 mg by mouth 2 (two) times daily., Disp: , Rfl:    levothyroxine (SYNTHROID) 88 MCG tablet, TAKE 1 TABLET(88 MCG) BY MOUTH DAILY BEFORE BREAKFAST, Disp: 90 tablet, Rfl: 3   metolazone (ZAROXOLYN) 2.5 MG tablet, Take 1 tablet by mouth every other Tuesday., Disp: 12 tablet, Rfl: 3   midodrine (PROAMATINE) 10 MG tablet, TAKE 1 TABLET THREE TIMES DAILY WITH MEALS, Disp: 270 tablet, Rfl: 3   pantoprazole (PROTONIX) 40 MG tablet, Take 40 mg by mouth daily as needed (for acid reflux)., Disp: , Rfl:    potassium chloride SA (KLOR-CON M) 20 MEQ tablet, TAKE 2 TABLETS THREE TIMES DAILY. TAKE AN EXTRA 2 TABLETS ON DAYS YOU TAKE METOLAZONE, Disp: 572 tablet, Rfl: 0   simvastatin  (ZOCOR) 10 MG tablet, Take 1 tablet (10 mg total) by mouth every evening., Disp: 30 tablet, Rfl: 11   spironolactone (ALDACTONE) 25 MG tablet, TAKE 1 TABLET (25 MG TOTAL) BY MOUTH EVERY EVENING., Disp: 90 tablet, Rfl: 0   torsemide (DEMADEX) 20 MG tablet, Take 4 tablets (80 mg total) by mouth every morning AND 3 tablets (60 mg total) every evening., Disp: 210 tablet, Rfl: 11 Allergies  Allergen Reactions   Ceftin Anaphylaxis    Face and throat swell    Geodon [Ziprasidone Hcl] Hives   Lisinopril Other (See Comments)    angioedema   Cefuroxime     Other reaction(s): anaphylaxis   Sulfacetamide Sodium-Sulfur     Other reaction(s): itch   Allopurinol Nausea Only and Other (See Comments)    weakness   Ativan [Lorazepam] Itching   Sulfa Antibiotics Itching   Valium [Diazepam] Other (See Comments)    Patient states that diazepam doesn't relax, it has the opposite effect.     Social History  Socioeconomic History   Marital status: Married    Spouse name: Not on file   Number of children: 2   Years of education: 17   Highest education level: Master's degree (e.g., MA, MS, MEng, MEd, MSW, MBA)  Occupational History   Occupation: retired  Tobacco Use   Smoking status: Former    Packs/day: 0.10    Years: 26.00    Total pack years: 2.60    Types: Cigarettes    Quit date: 03/23/2021    Years since quitting: 0.8   Smokeless tobacco: Never  Vaping Use   Vaping Use: Never used  Substance and Sexual Activity   Alcohol use: No   Drug use: No   Sexual activity: Yes  Other Topics Concern   Not on file  Social History Narrative   Tobacco Use Cigarettes: Former Smoker, Quit in 2008   No Alcohol   No recreational drug use   Diet: Regular/Low Carb   Exercise: None   Occupation: disabled   Education: Research officer, political party, masters   Children: 2   Firearms: No   Therapist, art Use: 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      Social Determinants of Health    Financial Resource Strain: Not on file  Food Insecurity: Not on file  Transportation Needs: Not on file  Physical Activity: Not on file  Stress: Not on file  Social Connections: Not on file  Intimate Partner Violence: Not on file    Physical Exam      Future Appointments  Date Time Provider [place]  02/25/2022  1:00 PM Conception Chancy, PsyD [facility]  02/26/2022 11:30 AM [name], Tiffany L, DO [facility]-ACP None  03/04/2022  2:30 PM Marzetta Board, DPM [facility]  03/09/2022  7:10 AM [facility] DEVICE REMOTES [facility]  03/11/2022  7:15 AM [facility] DEVICE REMOTES [facility]  04/01/2022  1:00 PM Hazle Coca, PhD [facility] None  04/01/2022  2:00 PM L[facility]- [place] [facility] None  04/08/2022  2:30 PM Hazle Coca, PhD [facility] None  04/15/2022  8:50 AM Narda Amber K, DO [facility] None  06/10/2022  7:15 AM [facility] DEVICE REMOTES [facility]  09/09/2022  7:15 AM [facility] DEVICE REMOTES [facility]  12/09/2022  7:15 AM [facility] DEVICE REMOTES [facility]     ACTION: Home visit completed

## 2022-02-19 ENCOUNTER — Other Ambulatory Visit (HOSPITAL_COMMUNITY): Payer: Self-pay

## 2022-02-19 NOTE — Progress Notes (Signed)
Paramedicine Encounter    Patient ID: Rhonda [name], female    DOB: 12-15-1950, 71 y.o.   MRN: [id number]  Arrived for home visit for Rhonda [name] who reports she is not feeling well today. She reports she is having some constipation along with a headache and cough. She says the constipation has been going on for a week and the cough with headache started today. 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 She has not been taking a daily allergy pill- I encouraged same to help with mucus reduction. She took Dayquil for her cough about 15 mins prior to our visit. She has been using Miralax, Ducolax for her constipation. I encouraged a daily stool softener and an increase in fiber in her diet. She agreed with plan.   I obtained vitals and assessment. WT- 190lbs BP- 110/70 HR- 96 O2- 97% RR- 18  Lungs-some congestion Lower legs- no swelling  I reviewed all meds and confirmed same. I filled two weeks worth of medications due to me being out next Thursday. She and her husband agreed with plan. No refills needed at present.   I reviewed all upcoming appointments with her and her husband. All were wrote on calendar also.   She had no other complaints or needs at this time.   -I did receive a phone call from her PCP office who informed me a nurse from [facility] reached out to them inquiring about her medications. I obtained her contact info and called her and left a voicemail to confirm their questions. Will follow up.   Home visit complete. I will see her in two weeks.   [name], EMT-Paramedic [phone] 02/19/2022   Patient Care Team: [name], MD as PCP - General (Family Medicine) [name], LCSW as Social Worker (Licensed Clinical Social Worker) [name], [name], DO as Consulting Physician (Neurology) [name], EMT as Paramedic [name] [name], MD as Attending Physician (Family Medicine)  Patient Active Problem List   Diagnosis Date Noted    Elevated troponin 12/02/2021   Nausea & vomiting 12/01/2021   Acute left ankle pain 10/11/2021   Intractable nausea and vomiting 10/08/2021   AKI (acute kidney injury) ([place]) 10/08/2021   Hyponatremia 10/08/2021   Decreased estrogen level 08/01/2021   Dementia ([place]) 08/01/2021   Frail elderly 08/01/2021   Hyperparathyroidism ([place]) 08/01/2021   Spondylolisthesis 08/01/2021   Varicose veins of bilateral lower extremities with other complications 67/34/1937   Lumbago without sciatica 04/30/2021   Abnormal gait 06/11/2020   Adult failure to thrive syndrome 06/11/2020   Allergic rhinitis 06/11/2020   Anxiety 06/11/2020   Asthma 06/11/2020   Benign intracranial hypertension 06/11/2020   Body mass index (BMI) 45.0-49.9, adult ([place]) 06/11/2020   Bowel incontinence 06/11/2020   Cholelithiasis 06/11/2020   Chronic sinusitis 06/11/2020   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   Daytime somnolence 06/11/2020   Diarrhea of presumed infectious origin 06/11/2020   Edema 06/11/2020   Family history of malignant neoplasm of gastrointestinal tract 06/11/2020   Gout 06/11/2020   History of fall 06/11/2020   History of infectious disease 06/11/2020   Insomnia 06/11/2020   Irregular bowel habits 06/11/2020   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   Lumbar spondylosis with myelopathy 06/11/2020   Macrocytosis 06/11/2020   Mild recurrent major depression ([place]) 06/11/2020   Personal history of colonic polyps 06/11/2020   Personal history of malignant neoplasm of breast 06/11/2020   Repeated falls 06/11/2020   Mixed anxiety and depressive disorder 06/11/2020  Irritable bowel syndrome 01/04/2020   Spinal stenosis of lumbar region 01/03/2020   Other symptoms and signs involving the musculoskeletal system 09/11/2019   Bilateral leg weakness 08/01/2019   Leg swelling 08/01/2019   Diabetes mellitus with neuropathy ([place]) 06/15/2019   Hypokalemia 11/08/2018   CKD (chronic kidney disease) stage 3, GFR 30-59 ml/min (HCC)  11/08/2018   Orthostatic hypotension 07/28/2017   SVD (spontaneous vaginal delivery)    Peripheral neuropathy    On home oxygen therapy    Migraines    Left bundle branch block    Hypothyroidism    Hypertension    Hyperlipidemia    Heart murmur    GERD (gastroesophageal reflux disease)    Exertional shortness of breath    Major depressive disorder    Back pain    Arthritis    Generalized anxiety disorder    Anemia    Chronic respiratory failure ([place]) 09/14/2013   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   Morbid obesity ([place]) 16/02/9603   Chronic systolic heart failure ([place]) 10/27/2012   Endometrial polyp 01/20/2012   Malignant tumor of breast ([place]) 03/26/2011   Unspecified vitamin D deficiency 03/26/2011    Current Outpatient Medications:    acetaminophen (TYLENOL) 500 MG tablet, Take 1,000 mg by mouth daily as needed (for pain and fever of 101 or greater)., Disp: , Rfl:    acetaZOLAMIDE (DIAMOX) 125 MG tablet, Take 1 tablet (125 mg total) by mouth daily., Disp: 90 tablet, Rfl: 3   buPROPion (WELLBUTRIN XL) 150 MG 24 hr tablet, Take 150 mg by mouth daily., Disp: , Rfl:    carvedilol (COREG) 3.125 MG tablet, TAKE 1 TABLET (3.125 MG) BY MOUTH TWICE DAILY WITH MEALS, Disp: 180 tablet, Rfl: 3   citalopram (CELEXA) 20 MG tablet, Take 20 mg by mouth daily., Disp: , Rfl:    clonazePAM (KLONOPIN) 0.5 MG tablet, Take 0.5 mg by mouth 2 (two) times daily as needed for anxiety., Disp: , Rfl:    cyanocobalamin (VITAMIN B12) 1000 MCG tablet, Take 1,000 mcg by mouth daily., Disp: , Rfl:    dicyclomine (BENTYL) 20 MG tablet, Take 20 mg by mouth 3 (three) times daily before meals., Disp: , Rfl:    fluticasone (FLONASE) 50 MCG/ACT nasal spray, 1 spray in each nostril, Disp: , Rfl:    gabapentin (NEURONTIN) 300 MG capsule, Take 300 mg by mouth 2 (two) times daily., Disp: , Rfl:    levothyroxine (SYNTHROID) 88 MCG tablet, TAKE 1 TABLET(88 MCG) BY MOUTH DAILY  BEFORE BREAKFAST, Disp: 90 tablet, Rfl: 3   metolazone (ZAROXOLYN) 2.5 MG tablet, Take 1 tablet by mouth every other Tuesday., Disp: 12 tablet, Rfl: 3   midodrine (PROAMATINE) 10 MG tablet, TAKE 1 TABLET THREE TIMES DAILY WITH MEALS, Disp: 270 tablet, Rfl: 3   pantoprazole (PROTONIX) 40 MG tablet, Take 40 mg by mouth daily as needed (for acid reflux)., Disp: , Rfl:    potassium chloride SA (KLOR-CON M) 20 MEQ tablet, TAKE 2 TABLETS THREE TIMES DAILY. TAKE AN EXTRA 2 TABLETS ON DAYS YOU TAKE METOLAZONE, Disp: 572 tablet, Rfl: 0   simvastatin (ZOCOR) 10 MG tablet, Take 1 tablet (10 mg total) by mouth every evening., Disp: 30 tablet, Rfl: 11   spironolactone (ALDACTONE) 25 MG tablet, TAKE 1 TABLET (25 MG TOTAL) BY MOUTH EVERY EVENING., Disp: 90 tablet, Rfl: 0   torsemide (DEMADEX) 20 MG tablet, Take 4 tablets (80 mg total) by mouth every morning AND 3 tablets (60 mg total) every  evening., Disp: 210 tablet, Rfl: 11 Allergies  Allergen Reactions   Ceftin Anaphylaxis    Face and throat swell    Geodon [Ziprasidone Hcl] Hives   Lisinopril Other (See Comments)    angioedema   Cefuroxime     Other reaction(s): anaphylaxis   Sulfacetamide Sodium-Sulfur     Other reaction(s): itch   Allopurinol Nausea Only and Other (See Comments)    weakness   Ativan [Lorazepam] Itching   Sulfa Antibiotics Itching   Valium [Diazepam] Other (See Comments)    Patient states that diazepam doesn't relax, it has the opposite effect.     Social History   Socioeconomic History   Marital status: Married    Spouse name: Not on file   Number of children: 2   Years of education: 40   Highest education level: Master's degree (e.g., MA, MS, MEng, MEd, MSW, MBA)  Occupational History   Occupation: retired  Tobacco Use   Smoking status: Former    Packs/day: 0.10    Years: 26.00    Total pack years: 2.60    Types: Cigarettes    Quit date: 03/23/2021    Years since quitting: 0.9   Smokeless tobacco: Never  Vaping  Use   Vaping Use: Never used  Substance and Sexual Activity   Alcohol use: No   Drug use: No   Sexual activity: Yes  Other Topics Concern   Not on file  Social History Narrative   Tobacco Use Cigarettes: Former Smoker, Quit in 2008   No Alcohol   No recreational drug use   Diet: Regular/Low Carb   Exercise: None   Occupation: disabled   Education: Research officer, political party, masters   Children: 2   Firearms: No   Therapist, art Use: 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      Social Determinants of Health   Financial Resource Strain: Not on file  Food Insecurity: Not on file  Transportation Needs: Not on file  Physical Activity: Not on file  Stress: Not on file  Social Connections: Not on file  Intimate Partner Violence: Not on file    Physical Exam      Future Appointments  Date Time Provider [place]  02/25/2022  1:00 PM Conception Chancy, PsyD [facility]  02/26/2022 11:30 AM [name], Tiffany L, DO ACP-ACP None  03/04/2022  2:30 PM Marzetta Board, DPM [facility]  03/09/2022  7:10 AM [facility] DEVICE REMOTES [facility]  03/11/2022  7:15 AM [facility] DEVICE REMOTES [facility]  04/01/2022  1:00 PM Hazle Coca, PhD [facility] None  04/01/2022  2:00 PM LBN- [place] [facility] None  04/08/2022  2:30 PM Hazle Coca, PhD [facility] None  04/15/2022  8:50 AM Narda Amber K, DO [facility] None  06/10/2022  7:15 AM [facility] DEVICE REMOTES [facility]  09/09/2022  7:15 AM [facility] DEVICE REMOTES [facility]  12/09/2022  7:15 AM [facility] DEVICE [facility]OTES [facility]     ACTION: Home visit completed

## 2022-02-22 ENCOUNTER — Emergency Department (HOSPITAL_BASED_OUTPATIENT_CLINIC_OR_DEPARTMENT_OTHER): Payer: Medicare HMO | Admitting: Radiology

## 2022-02-22 ENCOUNTER — Other Ambulatory Visit: Payer: Self-pay

## 2022-02-22 ENCOUNTER — Encounter (HOSPITAL_BASED_OUTPATIENT_CLINIC_OR_DEPARTMENT_OTHER): Payer: Self-pay

## 2022-02-22 ENCOUNTER — Emergency Department (HOSPITAL_BASED_OUTPATIENT_CLINIC_OR_DEPARTMENT_OTHER)
Admission: EM | Admit: 2022-02-22 | Discharge: 2022-02-22 | Disposition: A | Discharge status: Home / Self Care | Payer: Medicare HMO | Attending: Emergency Medicine | Admitting: Emergency Medicine

## 2022-02-22 DIAGNOSIS — M79671 Pain in right foot: Secondary | ICD-10-CM | POA: Diagnosis not present

## 2022-02-22 DIAGNOSIS — I130 Hypertensive heart and chronic kidney disease with heart failure and stage 1 through stage 4 chronic kidney disease, or unspecified chronic kidney disease: Secondary | ICD-10-CM | POA: Diagnosis not present

## 2022-02-22 DIAGNOSIS — Z79899 Other long term (current) drug therapy: Secondary | ICD-10-CM | POA: Insufficient documentation

## 2022-02-22 DIAGNOSIS — R0609 Other forms of dyspnea: Secondary | ICD-10-CM | POA: Diagnosis not present

## 2022-02-22 DIAGNOSIS — R0602 Shortness of breath: Secondary | ICD-10-CM | POA: Insufficient documentation

## 2022-02-22 DIAGNOSIS — I509 Heart failure, unspecified: Secondary | ICD-10-CM | POA: Diagnosis not present

## 2022-02-22 DIAGNOSIS — Z95810 Presence of automatic (implantable) cardiac defibrillator: Secondary | ICD-10-CM | POA: Diagnosis not present

## 2022-02-22 DIAGNOSIS — M25571 Pain in right ankle and joints of right foot: Secondary | ICD-10-CM | POA: Diagnosis not present

## 2022-02-22 DIAGNOSIS — M7989 Other specified soft tissue disorders: Secondary | ICD-10-CM | POA: Diagnosis not present

## 2022-02-22 DIAGNOSIS — R42 Dizziness and giddiness: Secondary | ICD-10-CM | POA: Diagnosis not present

## 2022-02-22 DIAGNOSIS — N189 Chronic kidney disease, unspecified: Secondary | ICD-10-CM | POA: Insufficient documentation

## 2022-02-22 LAB — CBC WITH DIFFERENTIAL/PLATELET
Abs Immature Granulocytes: 0.06 10*3/uL (ref 0.00–0.07)
Basophils Absolute: 0 10*3/uL (ref 0.0–0.1)
Basophils Relative: 0 %
Eosinophils Absolute: 0.2 10*3/uL (ref 0.0–0.5)
Eosinophils Relative: 2 %
HCT: 39.2 % (ref 36.0–46.0)
Hemoglobin: 13.1 g/dL (ref 12.0–15.0)
Immature Granulocytes: 1 %
Lymphocytes Relative: 20 %
Lymphs Abs: 2 10*3/uL (ref 0.7–4.0)
MCH: 33.6 pg (ref 26.0–34.0)
MCHC: 33.4 g/dL (ref 30.0–36.0)
MCV: 100.5 fL — ABNORMAL HIGH (ref 80.0–100.0)
Monocytes Absolute: 1 10*3/uL (ref 0.1–1.0)
Monocytes Relative: 10 %
Neutro Abs: 7 10*3/uL (ref 1.7–7.7)
Neutrophils Relative %: 67 %
Platelets: 328 10*3/uL (ref 150–400)
RBC: 3.9 MIL/uL (ref 3.87–5.11)
RDW: 13.3 % (ref 11.5–15.5)
WBC: 10.3 10*3/uL (ref 4.0–10.5)
nRBC: 0 % (ref 0.0–0.2)

## 2022-02-22 LAB — BASIC METABOLIC PANEL
Anion gap: 12 (ref 5–15)
BUN: 39 mg/dL — ABNORMAL HIGH (ref 8–23)
CO2: 27 mmol/L (ref 22–32)
Calcium: 10.3 mg/dL (ref 8.9–10.3)
Chloride: 95 mmol/L — ABNORMAL LOW (ref 98–111)
Creatinine, Ser: 1.67 mg/dL — ABNORMAL HIGH (ref 0.44–1.00)
GFR, Estimated: 33 mL/min — ABNORMAL LOW (ref >60)
Glucose, Bld: 117 mg/dL — ABNORMAL HIGH (ref 70–99)
Potassium: 3.9 mmol/L (ref 3.5–5.1)
Sodium: 134 mmol/L — ABNORMAL LOW (ref 135–145)

## 2022-02-22 LAB — BRAIN NATRIURETIC PEPTIDE: B Natriuretic Peptide: 29.8 pg/mL (ref 0.0–100.0)

## 2022-02-22 LAB — TROPONIN I (HIGH SENSITIVITY): Troponin I (High Sensitivity): 9 ng/L (ref <18)

## 2022-02-22 MED ORDER — ACETAMINOPHEN 325 MG PO TABS
650.0000 mg | ORAL_TABLET | Freq: Once | ORAL | Status: AC
  Administered 2022-02-22: 650 mg via ORAL
  Filled 2022-02-22: qty 2

## 2022-02-22 NOTE — ED Provider Notes (Signed)
[facility] Provider Note   CSN: [id number] Arrival date & time: 02/22/22  1640     History Chief Complaint  Patient presents with   Ankle Pain    Rhonda [name] is a 71 y.o. female with history of CHF, respiratory failure, CKD, hyperlipidemia, hypertension, biventricular ICD presents the emergency department for evaluation of right foot pain for the past 5 days.  She is also complaining of a week of worsening shortness of breath, mainly dyspnea on exertion.  Denies any leg swelling, chest pain, nausea, vomiting, diaphoresis, abdominal pain.  She does mention some lightheadedness, denies any syncope.  She denies any recent falls.  She denies any fevers.  She reports has been increasingly painful to walk on her foot.  She reports she is compliant with her CHF medications.  Denies any weight gain.   Ankle Pain Associated symptoms: no fever        Home Medications Prior to Admission medications   Medication Sig Start Date End Date Taking? Authorizing Provider  acetaminophen (TYLENOL) 500 MG tablet Take 1,000 mg by mouth daily as needed (for pain and fever of 101 or greater).    [provider]  acetaZOLAMIDE (DIAMOX) 125 MG tablet Take 1 tablet (125 mg total) by mouth daily. 02/27/21   Patel, Donika K, DO  buPROPion (WELLBUTRIN XL) 150 MG 24 hr tablet Take 150 mg by mouth daily.    [provider]  carvedilol (COREG) 3.125 MG tablet TAKE 1 TABLET (3.125 MG) BY MOUTH TWICE DAILY WITH MEALS 10/01/21   Joette Catching, PA-C  citalopram (CELEXA) 20 MG tablet Take 20 mg by mouth daily.    [provider]  clonazePAM (KLONOPIN) 0.5 MG tablet Take 0.5 mg by mouth 2 (two) times daily as needed for anxiety. 10/15/21   [provider]  cyanocobalamin (VITAMIN B12) 1000 MCG tablet Take 1,000 mcg by mouth daily.    [provider]  dicyclomine (BENTYL) 20 MG tablet Take 20 mg by mouth 3 (three) times daily before meals.     [provider]  fluticasone (FLONASE) 50 MCG/ACT nasal spray 1 spray in each nostril    [provider]  gabapentin (NEURONTIN) 300 MG capsule Take 300 mg by mouth 2 (two) times daily. 10/12/21   [provider]  levothyroxine (SYNTHROID) 88 MCG tablet TAKE 1 TABLET(88 MCG) BY MOUTH DAILY BEFORE BREAKFAST 07/08/20   Philemon Kingdom, MD  metolazone (ZAROXOLYN) 2.5 MG tablet Take 1 tablet by mouth every other Tuesday. 10/01/21   Joette Catching, PA-C  midodrine (PROAMATINE) 10 MG tablet TAKE 1 TABLET THREE TIMES DAILY WITH MEALS 10/01/21   Joette Catching, PA-C  pantoprazole (PROTONIX) 40 MG tablet Take 40 mg by mouth daily as needed (for acid reflux). 10/13/21   [provider]  potassium chloride SA (KLOR-CON M) 20 MEQ tablet TAKE 2 TABLETS THREE TIMES DAILY. TAKE AN EXTRA 2 TABLETS ON DAYS YOU TAKE METOLAZONE 01/14/22   Joette Catching, PA-C  simvastatin (ZOCOR) 10 MG tablet Take 1 tablet (10 mg total) by mouth every evening. 10/01/21   Joette Catching, PA-C  spironolactone (ALDACTONE) 25 MG tablet TAKE 1 TABLET (25 MG TOTAL) BY MOUTH EVERY EVENING. 01/02/22   Bensimhon, Shaune Pascal, MD  torsemide (DEMADEX) 20 MG tablet Take 4 tablets (80 mg total) by mouth every morning AND 3 tablets (60 mg total) every evening. 10/01/21   Joette Catching, PA-C      Allergies  Ceftin, Geodon [ziprasidone hcl], Lisinopril, Cefuroxime, Sulfacetamide sodium-sulfur, Allopurinol, Ativan [lorazepam], Sulfa antibiotics, and Valium [diazepam]    Review of Systems   Review of Systems  Constitutional:  Negative for chills, diaphoresis and fever.  Respiratory:  Positive for shortness of breath. Negative for cough.   Cardiovascular:  Negative for chest pain and palpitations.  Gastrointestinal:  Negative for abdominal pain, nausea and vomiting.  Genitourinary:  Negative for dysuria and hematuria.  Musculoskeletal:  Positive for myalgias.    Physical  Exam Updated Vital Signs BP (!) 142/92   Pulse 73   Temp 98 F (36.7 C)   Resp (!) 24   SpO2 100%  Physical Exam Vitals and nursing note reviewed.  Constitutional:      General: She is not in acute distress.    Appearance: Normal appearance. She is not toxic-appearing.  HENT:     Head: Normocephalic and atraumatic.     Mouth/Throat:     Mouth: Mucous membranes are moist.  Eyes:     General: No scleral icterus. Cardiovascular:     Rate and Rhythm: Normal rate and regular rhythm.  Pulmonary:     Effort: No respiratory distress.     Breath sounds: Normal breath sounds.     Comments: Mild tachypnea, but no accessory muscle use, nasal flaring, tripoding, or cyanosis present. Abdominal:     Palpations: Abdomen is soft.  Musculoskeletal:        General: No deformity.     Cervical back: Normal range of motion.     Right lower leg: No edema.     Left lower leg: No edema.     Comments: Patient has some increased warmth and erythema over the dorsum of her right foot.  Tenderness to palpation.  She is not tender to the medial or lateral malleoli.  She has extension and flexion of her foot with pain.  Cap refill 3 seconds.  Palpable pulses bilaterally.  Compartments are soft.  No pitting edema bilaterally.  Skin:    General: Skin is warm and dry.  Neurological:     General: No focal deficit present.     Mental Status: She is alert. Mental status is at baseline.     ED Results / Procedures / Treatments   Labs (all labs ordered are listed, but only abnormal results are displayed) Labs Reviewed  BRAIN NATRIURETIC PEPTIDE  CBC WITH DIFFERENTIAL/PLATELET  BASIC METABOLIC PANEL  TROPONIN I (HIGH SENSITIVITY)    EKG EKG Interpretation  Date/Time:  Sunday February 22 2022 17:06:59 EDT Ventricular Rate:  82 PR Interval:  174 QRS Duration: 144 QT Interval:  428 QTC Calculation: 500 R Axis:   20 Text Interpretation: Atrial-sensed ventricular-paced rhythm Confirmed by Sherwood Gambler [phone]) on 02/22/2022 8:26:36 PM  Radiology DG Chest 2 View  Result Date: 02/22/2022 CLINICAL DATA:  Shortness of breath EXAM: CHEST - 2 VIEW COMPARISON:  12/01/2021 FINDINGS: The heart size is normal. Left chest multi lead pacer defibrillator. Both lungs are clear. Disc degenerative disease of the thoracic spine. IMPRESSION: No acute abnormality of the lungs. Electronically Signed   By: Delanna Ahmadi M.D.   On: 02/22/2022 18:49   DG Ankle Complete Right  Result Date: 02/22/2022 CLINICAL DATA:  Right ankle and foot pain beginning 2-3 days ago. EXAM: RIGHT ANKLE - COMPLETE 3+ VIEW COMPARISON:  None available FINDINGS: The ankle mortise is symmetric and intact. Mild possibly degenerative subchondral lucent presumably cystic changes measuring up to 7 mm in transverse dimension at the  far medial aspect of the talar dome. Mild lateral malleolar soft tissue swelling. No acute fracture is seen. No dislocation. Navicular-cuneiform severe joint space narrowing, subchondral cystic change, and peripheral osteophytosis as seen on radiographs of the contralateral foot 06/09/2016. Mild vascular calcifications. IMPRESSION: 1. Mild lateral malleolar soft tissue swelling. No acute fracture is seen. 2. Severe navicular-cuneiform osteoarthritis. Electronically Signed   By: Yvonne Kendall M.D.   On: 02/22/2022 18:27    Procedures Procedures   Medications Ordered in ED Medications - No data to display  ED Course/ Medical Decision Making/ A&P                           Medical Decision Making Amount and/or Complexity of Data Reviewed Labs: ordered. Radiology: ordered.   71 year old female presents the emergency department for evaluation of right foot pain and shortness of breath.  Differential diagnosis includes but is not limited to arthritis, septic arthritis, gout, ACS, arrhythmia, CHF exacerbation, pneumonia, pneumothorax, dissection, aneurysm.  Vital signs show slightly elevated blood pressure 140/92,  afebrile, normal pulse rate, patient's mildly tachypneic at 24 but satting well on room air.  Physical exam as noted above.  For the patient's foot, x-ray was ordered in triage.  Chest x-ray was ordered for shortness of breath however there was no labs added in triage.  We will add on shortness of breath labs.  Patient's foot x-ray shows Mild lateral malleolar soft tissue swelling. No acute fracture is seen. 2. Severe navicular-cuneiform osteoarthritis.  Her chest x-ray shows no acute cardiopulmonary process.  There is a left chest multilead pacer defibrillator present.  Degenerative changes of the thoracic spine.  Troponin, BNP, BMP, CBC ordered.   I likely think her foot is more consistent with gout given the overlying warmth and erythema.  At this time, will handoff to my attending.  9:57 PM Care of SAWYER KAHAN  transferred to Dr. Regenia Skeeter at the end of my shift as the patient will require reassessment once labs/imaging have resulted. Patient presentation, ED course, and plan of care discussed with review of all pertinent labs and imaging. Please see his/her note for further details regarding further ED course and disposition. Plan at time of handoff is follow up on SOB labs. This may be altered or completely changed at the discretion of the oncoming team pending results of further workup.   Final Clinical Impression(s) / ED Diagnoses Final diagnoses:  None    Rx / DC Orders ED Discharge Orders     None         Sherrell Puller, PA-C 02/22/22 2212    Sherwood Gambler, MD 02/22/22 [phone]

## 2022-02-22 NOTE — Discharge Instructions (Addendum)
It seems that your ankle pain is coming from arthritis in your ankle.  Follow-up with your primary care physician.  Take Tylenol for pain.  There is no clear cause for your shortness of breath this been slowly worsening.  You should follow-up with your cardiologist.  If you develop fever, chest pain, new or worsening shortness of breath, or any other new/concerning symptoms then return to the ER for evaluation.

## 2022-02-22 NOTE — ED Triage Notes (Signed)
Pt arrives POV with her husband with c/o right ankle and foot pain that she says began this past Thursday or Friday.  Pt also says she has been experiencing shortness of breath for about the past week.  She denies any fall or injury to cause pain to foot/ankle.  BIB wheelchair, says she has been using cane for ambulation assistance at home.

## 2022-02-22 NOTE — ED Provider Notes (Signed)
Patient has progressive dyspnea on exertion for at least 1 month.  Eitel signs are stable.  Lungs are clear on my exam.  No hypoxia.  X-ray unremarkable.  Labs viewed/interpreted and there is no anemia, troponin leak, obvious CHF.  Unclear why she is progressively worsening but I will have a follow-up with her cardiologist.  As for her ankle, probably this is related to the arthritis seen on the x-ray.  There is some very slight erythema that I think is inflammatory rather than cellulitis, especially given the work-up.  I do not think this needs antibiotics.  There is a discussion earlier about potential for gout but after my discussion with her, is not overtly point tender, she is never had gout before, and is clinically does not really fit.  I do not think steroids or other antigout medicines are required.  We discussed supportive care and will Ace wrap her ankle but I think she is otherwise stable for discharge.   Sherwood Gambler, MD 02/22/22 [phone]

## 2022-02-23 ENCOUNTER — Telehealth (HOSPITAL_COMMUNITY): Payer: Self-pay

## 2022-02-23 NOTE — Telephone Encounter (Signed)
Please see labs from [facility] recent ER visit yesterday and advise if any med changes need to be made. I will follow up in the home accordingly.    [name], [place] 02/23/2022

## 2022-02-25 ENCOUNTER — Ambulatory Visit: Payer: Medicare HMO | Admitting: Psychologist

## 2022-02-26 ENCOUNTER — Other Ambulatory Visit: Payer: Medicare HMO | Admitting: Internal Medicine

## 2022-02-26 ENCOUNTER — Encounter: Payer: Self-pay | Admitting: Internal Medicine

## 2022-02-26 VITALS — BP 138/82 | HR 88 | Temp 96.8°F | Resp 16 | Wt 196.9 lb

## 2022-02-26 DIAGNOSIS — F339 Major depressive disorder, recurrent, unspecified: Secondary | ICD-10-CM

## 2022-02-26 DIAGNOSIS — Z515 Encounter for palliative care: Secondary | ICD-10-CM

## 2022-02-26 DIAGNOSIS — R419 Unspecified symptoms and signs involving cognitive functions and awareness: Secondary | ICD-10-CM

## 2022-02-26 DIAGNOSIS — I5020 Unspecified systolic (congestive) heart failure: Secondary | ICD-10-CM

## 2022-02-26 DIAGNOSIS — M25571 Pain in right ankle and joints of right foot: Secondary | ICD-10-CM

## 2022-02-26 NOTE — Progress Notes (Signed)
[place] Visit Telephone: [phone]  Fax: [phone]   Date of encounter: 02/26/22 12:26 PM PATIENT NAME: Rhonda [name] [address] [place] [address]   [phone] (home)  DOB: 1950-07-19 MRN: [id number] PRIMARY CARE PROVIDER:    [name], MD,  Rhonda [name] [id number] [phone]  REFERRING PROVIDER:   [name], MD [address] [place],  [place] [address] [phone]  RESPONSIBLE PARTY:    Contact Information     Name Relation Home Work Mobile   [name],Rhonda Spouse [phone]   Rhonda [name] Daughter   [phone]   [facility] Daughter [phone]          I met face to face with patient and family in her home along with her husband, Rhonda [name]. Palliative Care was asked to follow this patient by consultation request of  Rhonda [name], * to address advance care planning and complex medical decision making. This is follow-up visit.                                     ASSESSMENT AND PLAN / RECOMMENDATIONS:   Advance Care Planning/Goals of Care: Goals include to maximize quality of life and symptom management. Patient/health care surrogate gave his/her permission to discuss.Our advance care planning conversation included a discussion about:    The value and importance of advance care planning  Experiences with loved ones who have been seriously ill or have died  Exploration of personal, cultural or spiritual beliefs that might influence medical decisions  Exploration of goals of care in the event of a sudden injury or illness  Identification  of a healthcare agent  Review and updating or creation of an  advance directive document . Decision not to resuscitate or to de-escalate disease focused treatments due to poor prognosis. CODE STATUS:  MOST previously completed 10/02/21 with full code, limited additional  interventions (avoid intubation and mechanical ventilation), determine use or limitation of abx when infection occurs, IVF trial and no tube feeding  Symptom Management/Plan: 1. Neurocognitive disorder -remains a big concern, her husband notes they have not been back to Rhonda [name], but plans to ask Rhonda [name] to refer them back there at her upcoming PCP appt -they do now have some in-home support but would benefit from more -they are in contact with social services to arrange this already and await a return call when more hours are available for them  2. Depression, recurrent ([place]) -remains on same meds, continue f/u with Rhonda [name] from counseling perspective  3. HFrEF (heart failure with reduced ejection fraction) (Rhonda [name]) -labs and exam c/w euvolemia 10/1 and presently -continue with daily weights, sodium and fluid restriction and extra diuretic as managed by paramedicine team through heart failure clinic -pt spends a lot of time sleeping and not active  4. Acute right ankle pain -felt to be arthritic per ER notes and not gout -has soft orthopedic sandal that was provided that she should wear when up ambulating--reinforced this--keep podiatry f/u  5. Palliative care by specialist -based on her diagnosis and current prognosis, she will no longer be followed by our team -explained to her husband that he is welcome to reach request a new referral to our organization if his wife is declining and they are needing hospice care--he expressed understanding and gratitude for support up to this time -emphasized importance  of f/u with social services, PCP and neuro and continued psych f/u   This visit was coded based on medical decision making (MDM).  PPS: 40%  HOSPICE ELIGIBILITY/DIAGNOSIS: TBD  Chief Complaint: Follow-up palliative visit  HISTORY OF PRESENT ILLNESS:  Rhonda [name] is a 71 y.o. year old female  with chronic systolic chf, major depression, neurocognitive disorder,  obesity, and recent Rhonda [name] visit with right ankle pain who lives at home with her husband.  She is followed by the paramedicine program through [facility] Failure clinic fortunately.   She skipped her counseling appt yesterday b/c she didn't feel well and her husband didn't either.  Not yet rescheduled.  Memory remains poor.  Rhonda [name] reports she needs to f/u with Rhonda [name].  She has a f/u with Rhonda [name] and they're asking her to set that up.    Mood is a big issue.  She's very negative in her thoughts.    She's down about 10 lbs in the past 6-8 wks.  Pt wants to have a new stove put in, but her husband is afraid to put that in b/c [place] may leave it on.  They need in-home care--have [place] social services aide comes two days per week 3 hrs each time (tues and fri).  Rhonda [name] continues to work full time fri/sat/sun/mon/tues.  He is off in the middle of the week so he can get her to appts.  Aide helps with  housekeeping, can prep meals and makes sure that Rhonda [name] is up and taking medications.  Rhonda [name] buys meals that can be microwaved.  Aide makes sure she eats it.  Rhonda [name] paramedic still coming to organize pt's meds.  Plan is to stay home long-term.  Rhonda [name] was helping with their arrangements for increased home care hours. They are #[phone] on the list for more services per letter received 02/12/22.  History obtained from review of EMR, discussion with primary team, and interview with family, facility staff/caregiver and/or Rhonda [name].   I reviewed available labs, medications, imaging, studies and related documents from the EMR.  Records reviewed and summarized above.   ROS Review of Systemssee hpi  Physical Exam: Vitals:   02/26/22 1223  BP: 138/82  Pulse: 88  Resp: 16  Temp: (!) 96.8 F (36 C)  SpO2: 98%  Weight: 196 lb 14.4 oz (89.3 kg)   Body mass index is 34.88 kg/m. Wt Readings from Last 500 Encounters:  02/26/22 196 lb 14.4 oz (89.3 kg)   02/19/22 190 lb (86.2 kg)  02/12/22 198 lb (89.8 kg)  02/05/22 195 lb (88.5 kg)  01/12/22 192 lb 11.2 oz (87.4 kg)  01/07/22 198 lb 12.8 oz (90.2 kg)  01/05/22 197 lb (89.4 kg)  12/29/21 198 lb (89.8 kg)  12/22/21 199 lb (90.3 kg)  12/15/21 208 lb (94.3 kg)  12/08/21 205 lb (93 kg)  12/02/21 187 lb 1.6 oz (84.9 kg)  11/10/21 200 lb (90.7 kg)  11/03/21 204 lb (92.5 kg)  10/20/21 209 lb (94.8 kg)  10/13/21 210 lb (95.3 kg)  10/06/21 211 lb (95.7 kg)  09/29/21 211 lb (95.7 kg)  09/22/21 211 lb (95.7 kg)  09/15/21 214 lb 11.2 oz (97.4 kg)  09/02/21 218 lb (98.9 kg)  08/26/21 220 lb (99.8 kg)  08/19/21 226 lb (102.5 kg)  08/13/21 224 lb 3.2 oz (101.7 kg)  08/13/21 224 lb 3.2 oz (101.7 kg)  08/12/21 224 lb (101.6 kg)  07/30/21 225 lb (102.1 kg)  07/22/21 222 lb (100.7 kg)  07/15/21 233 lb (105.7 kg)  07/08/21 225 lb (102.1 kg)  07/01/21 233 lb (105.7 kg)  06/25/21 226 lb (102.5 kg)  06/10/21 226 lb (102.5 kg)  05/27/21 227 lb (103 kg)  05/13/21 227 lb (103 kg)  05/06/21 225 lb (102.1 kg)  04/29/21 224 lb (101.6 kg)  04/23/21 229 lb 9.6 oz (104.1 kg)  04/22/21 230 lb (104.3 kg)  04/15/21 228 lb (103.4 kg)  04/08/21 230 lb (104.3 kg)  04/01/21 232 lb (105.2 kg)  03/25/21 228 lb 6.4 oz (103.6 kg)  03/19/21 234 lb 3.2 oz (106.2 kg)  03/11/21 231 lb (104.8 kg)  03/03/21 230 lb (104.3 kg)  02/27/21 228 lb (103.4 kg)  02/25/21 231 lb (104.8 kg)  02/18/21 230 lb (104.3 kg)  02/11/21 226 lb 6.4 oz (102.7 kg)  02/04/21 226 lb 6.4 oz (102.7 kg)  01/28/21 224 lb (101.6 kg)  01/08/21 228 lb 12.8 oz (103.8 kg)  01/07/21 225 lb (102.1 kg)  12/31/20 231 lb (104.8 kg)  12/10/20 223 lb (101.2 kg)  11/19/20 226 lb (102.5 kg)  11/05/20 220 lb (99.8 kg)  10/22/20 209 lb (94.8 kg)  10/08/20 211 lb (95.7 kg)  10/01/20 205 lb (93 kg)  09/24/20 209 lb (94.8 kg)  09/17/20 213 lb (96.6 kg)  09/03/20 211 lb (95.7 kg)  08/27/20 202 lb (91.6 kg)  08/13/20 207 lb (93.9 kg)  08/06/20 210  lb (95.3 kg)  07/30/20 206 lb 9.6 oz (93.7 kg)  07/25/20 206 lb 9.6 oz (93.7 kg)  07/23/20 218 lb (98.9 kg)  07/09/20 209 lb 6.4 oz (95 kg)  06/25/20 208 lb (94.3 kg)  06/18/20 204 lb (92.5 kg)  06/12/20 204 lb 8 oz (92.8 kg)  06/12/20 187 lb 12.8 oz (85.2 kg)  06/11/20 204 lb (92.5 kg)  06/04/20 208 lb (94.3 kg)  05/21/20 203 lb (92.1 kg)  05/14/20 202 lb (91.6 kg)  05/07/20 212 lb (96.2 kg)  04/30/20 222 lb (100.7 kg)  04/23/20 224 lb (101.6 kg)  04/16/20 224 lb (101.6 kg)  04/09/20 214 lb (97.1 kg)  03/26/20 209 lb (94.8 kg)  03/14/20 210 lb 12.8 oz (95.6 kg)  03/12/20 207 lb (93.9 kg)  03/05/20 208 lb (94.3 kg)  02/29/20 210 lb (95.3 kg)  02/27/20 207 lb (93.9 kg)  02/20/20 208 lb (94.3 kg)  02/14/20 210 lb 6.4 oz (95.4 kg)  02/13/20 204 lb (92.5 kg)  02/06/20 207 lb (93.9 kg)  01/30/20 205 lb 11.2 oz (93.3 kg)  01/23/20 195 lb (88.5 kg)  01/16/20 196 lb (88.9 kg)  01/09/20 200 lb (90.7 kg)  01/02/20 204 lb (92.5 kg)  12/26/19 199 lb (90.3 kg)  12/19/19 197 lb (89.4 kg)  12/12/19 199 lb (90.3 kg)  11/28/19 198 lb (89.8 kg)  11/21/19 196 lb (88.9 kg)  11/14/19 192 lb (87.1 kg)  11/08/19 204 lb 6.4 oz (92.7 kg)  11/08/19 204 lb (92.5 kg)  11/07/19 200 lb (90.7 kg)  10/24/19 206 lb (93.4 kg)  10/17/19 200 lb 6.4 oz (90.9 kg)  10/10/19 200 lb (90.7 kg)  10/03/19 194 lb (88 kg)  09/26/19 203 lb (92.1 kg)  09/19/19 192 lb 9.6 oz (87.4 kg)  09/12/19 201 lb (91.2 kg)  08/29/19 179 lb (81.2 kg)  08/22/19 192 lb (87.1 kg)  08/22/19 192 lb (87.1 kg)  08/15/19 188 lb 8 oz (85.5 kg)  08/08/19 186 lb 4.8 oz (84.5 kg)  08/02/19 175 lb (  79.4 kg)  07/26/19 185 lb 9.6 oz (84.2 kg)  06/14/19 180 lb (81.6 kg)  06/07/19 175 lb 9.6 oz (79.7 kg)  05/25/19 168 lb (76.2 kg)  04/27/19 170 lb (77.1 kg)  04/14/19 175 lb (79.4 kg)  04/07/19 175 lb (79.4 kg)  04/05/19 168 lb 1.6 oz (76.2 kg)  03/30/19 169 lb (76.7 kg)  03/13/19 169 lb (76.7 kg)  03/08/19 170 lb 6.4 oz (77.3 kg)   02/22/19 173 lb 12.8 oz (78.8 kg)  02/07/19 183 lb 3.2 oz (83.1 kg)  01/24/19 171 lb 3.2 oz (77.7 kg)  01/12/19 168 lb (76.2 kg)  12/28/18 178 lb 3.2 oz (80.8 kg)  12/19/18 184 lb 4.8 oz (83.6 kg)  12/08/18 177 lb (80.3 kg)  12/01/18 170 lb (77.1 kg)  11/24/18 176 lb (79.8 kg)  11/17/18 172 lb (78 kg)  11/10/18 174 lb 13.2 oz (79.3 kg)  11/02/18 185 lb (83.9 kg)  10/27/18 183 lb (83 kg)  10/19/18 179 lb 3.2 oz (81.3 kg)  10/12/18 173 lb (78.5 kg)  09/28/18 166 lb 6.4 oz (75.5 kg)  09/28/18 166 lb 6.4 oz (75.5 kg)  09/21/18 171 lb 1.6 oz (77.6 kg)  09/14/18 172 lb (78 kg)  09/07/18 167 lb (75.8 kg)  08/31/18 173 lb (78.5 kg)  08/26/18 172 lb 6.4 oz (78.2 kg)  08/18/18 177 lb (80.3 kg)  08/12/18 171 lb (77.6 kg)  08/10/18 173 lb (78.5 kg)  08/05/18 177 lb (80.3 kg)  07/28/18 182 lb 4.8 oz (82.7 kg)  07/27/18 181 lb 6 oz (82.3 kg)  07/22/18 184 lb (83.5 kg)  07/14/18 182 lb 1.6 oz (82.6 kg)  07/08/18 182 lb 1.6 oz (82.6 kg)  06/16/18 181 lb (82.1 kg)  06/10/18 180 lb (81.6 kg)  06/09/18 185 lb (83.9 kg)  06/02/18 183 lb 9.6 oz (83.3 kg)  05/31/18 181 lb 10.5 oz (82.4 kg)  05/27/18 181 lb 11.2 oz (82.4 kg)  05/12/18 192 lb 3.2 oz (87.2 kg)  05/05/18 192 lb (87.1 kg)  04/28/18 194 lb 3.2 oz (88.1 kg)  04/20/18 195 lb 8 oz (88.7 kg)  04/13/18 195 lb (88.5 kg)  04/07/18 202 lb 9.6 oz (91.9 kg)  04/01/18 195 lb (88.5 kg)  03/30/18 191 lb 9.6 oz (86.9 kg)  03/30/18 191 lb 9.6 oz (86.9 kg)  03/25/18 192 lb 6.4 oz (87.3 kg)  03/17/18 194 lb 9.6 oz (88.3 kg)  03/10/18 199 lb 11.2 oz (90.6 kg)  03/04/18 201 lb 8 oz (91.4 kg)  02/24/18 196 lb (88.9 kg)  02/17/18 196 lb (88.9 kg)  02/10/18 202 lb (91.6 kg)  02/09/18 197 lb (89.4 kg)  02/02/18 203 lb (92.1 kg)  01/20/18 208 lb 12.8 oz (94.7 kg)  01/03/18 212 lb 6 oz (96.3 kg)  10/29/17 220 lb 8 oz (100 kg)  09/23/17 232 lb 9.6 oz (105.5 kg)  08/25/17 222 lb 6.4 oz (100.9 kg)  07/28/17 224 lb 12.8 oz (102 kg)  07/23/17  228 lb 2 oz (103.5 kg)  06/04/17 228 lb (103.4 kg)  12/17/16 234 lb 6.4 oz (106.3 kg)  11/04/16 238 lb 8 oz (108.2 kg)  07/31/16 250 lb 8 oz (113.6 kg)  02/19/16 255 lb 9.6 oz (115.9 kg)  07/10/15 247 lb (112 kg)  10/04/14 224 lb (101.6 kg)  06/21/14 234 lb 9.6 oz (106.4 kg)  02/15/14 237 lb 8 oz (107.7 kg)  11/16/13 243 lb 2 oz (110.3 kg)  09/14/13 246 lb 12.8 oz (  111.9 kg)  08/04/13 248 lb (112.5 kg)  05/10/13 248 lb 12.8 oz (112.9 kg)  05/04/13 248 lb 7.3 oz (112.7 kg)  05/02/13 247 lb 6.4 oz (112.2 kg)  04/12/13 248 lb 4 oz (112.6 kg)  02/23/13 252 lb 6.4 oz (114.5 kg)  01/25/13 257 lb 1.9 oz (116.6 kg)  12/15/12 252 lb 8 oz (114.5 kg)  11/23/12 270 lb 12.8 oz (122.8 kg)  11/16/12 262 lb 6.4 oz (119 kg)  10/27/12 283 lb 1.9 oz (128.4 kg)  08/05/12 266 lb (120.7 kg)  05/11/12 267 lb (121.1 kg)  05/05/12 271 lb (122.9 kg)  01/14/12 265 lb 4.8 oz (120.3 kg)  01/13/12 262 lb (118.8 kg)  09/17/11 296 lb (134.3 kg)  09/10/11 290 lb 3.2 oz (131.6 kg)  07/16/11 292 lb 11.2 oz (132.8 kg)  05/07/11 287 lb (130.2 kg)   Physical Exam Constitutional:      General: She is not in acute distress.    Appearance: Normal appearance. She is obese.  HENT:     Head: Normocephalic and atraumatic.     Nose:     Comments: Cleft palate with device Eyes:     Comments: glasses  Cardiovascular:     Rate and Rhythm: Normal rate and regular rhythm.  Pulmonary:     Effort: Pulmonary effort is normal.     Breath sounds: Normal breath sounds. No rales.  Abdominal:     General: Bowel sounds are normal.     Palpations: Abdomen is soft.     Tenderness: There is no abdominal tenderness.  Musculoskeletal:        General: Normal range of motion.     Right lower leg: Edema present.     Left lower leg: Edema present.     Comments: Right ankle is no longer notably more swollen, but reports aches when walks on it  Skin:    General: Skin is warm and dry.     Coloration: Skin is pale.   Neurological:     General: No focal deficit present.     Mental Status: She is alert and oriented to person, place, and time.     Gait: Gait normal.     Comments: But repeats questions, short-term memory over minutes at times  Psychiatric:     Comments: Flat affect, just wanting to go back to bed     CURRENT PROBLEM LIST:  Patient Active Problem List   Diagnosis Date Noted   Elevated troponin 12/02/2021   Nausea & vomiting 12/01/2021   Acute left ankle pain 10/11/2021   Intractable nausea and vomiting 10/08/2021   AKI (acute kidney injury) ([place]) 10/08/2021   Hyponatremia 10/08/2021   Decreased estrogen level 08/01/2021   Dementia ([place]) 08/01/2021   Frail elderly 08/01/2021   Hyperparathyroidism ([place]) 08/01/2021   Spondylolisthesis 08/01/2021   Varicose veins of bilateral lower extremities with other complications 24/58/0998   Lumbago without sciatica 04/30/2021   Abnormal gait 06/11/2020   Adult failure to thrive syndrome 06/11/2020   Allergic rhinitis 06/11/2020   Anxiety 06/11/2020   Asthma 06/11/2020   Benign intracranial hypertension 06/11/2020   Body mass index (BMI) 45.0-49.9, adult ([place]) 06/11/2020   Bowel incontinence 06/11/2020   Cholelithiasis 06/11/2020   Chronic sinusitis 06/11/2020   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   Daytime somnolence 06/11/2020   Diarrhea of presumed infectious origin 06/11/2020   Edema 06/11/2020   Family history of malignant neoplasm of gastrointestinal tract 06/11/2020   Gout 06/11/2020   History of  fall 06/11/2020   History of infectious disease 06/11/2020   Insomnia 06/11/2020   Irregular bowel habits 06/11/2020   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   Lumbar spondylosis with myelopathy 06/11/2020   Macrocytosis 06/11/2020   Mild recurrent major depression ([place]) 06/11/2020   Personal history of colonic polyps 06/11/2020   Personal history of malignant neoplasm of breast 06/11/2020   Repeated falls 06/11/2020   Mixed anxiety and depressive  disorder 06/11/2020   Irritable bowel syndrome 01/04/2020   Spinal stenosis of lumbar region 01/03/2020   Other symptoms and signs involving the musculoskeletal system 09/11/2019   Bilateral leg weakness 08/01/2019   Leg swelling 08/01/2019   Diabetes mellitus with neuropathy ([place]) 06/15/2019   Hypokalemia 11/08/2018   CKD (chronic kidney disease) stage 3, GFR 30-59 ml/min (HCC) 11/08/2018   Orthostatic hypotension 07/28/2017   SVD (spontaneous vaginal delivery)    Peripheral neuropathy    On home oxygen therapy    Migraines    Left bundle branch block    Hypothyroidism    Hypertension    Hyperlipidemia    Heart murmur    GERD (gastroesophageal reflux disease)    Exertional shortness of breath    Major depressive disorder    Back pain    Arthritis    Generalized anxiety disorder    Anemia    Chronic respiratory failure ([place]) 09/14/2013   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   Morbid obesity ([place]) 20/02/711   Chronic systolic heart failure ([place]) 10/27/2012   Endometrial polyp 01/20/2012   Malignant tumor of breast ([place]) 03/26/2011   Unspecified vitamin D deficiency 03/26/2011    PAST MEDICAL HISTORY:  Active Ambulatory Problems    Diagnosis Date Noted   Malignant tumor of breast ([place]) 03/26/2011   Unspecified vitamin D deficiency 03/26/2011   Endometrial polyp 19/75/8832   Chronic systolic heart failure ([place]) 10/27/2012   Morbid obesity ([place]) 11/01/2012   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   Chronic respiratory failure ([place]) 09/14/2013   SVD (spontaneous vaginal delivery)    Peripheral neuropathy    On home oxygen therapy    Migraines    Left bundle branch block    Hypothyroidism    Hypertension    Hyperlipidemia    Heart murmur    GERD (gastroesophageal reflux disease)    Exertional shortness of breath    Major depressive disorder    Back pain    Arthritis    Generalized  anxiety disorder    Anemia    Orthostatic hypotension 07/28/2017   Hypokalemia 11/08/2018   CKD (chronic kidney disease) stage 3, GFR 30-59 ml/min (HCC) 11/08/2018   Diabetes mellitus with neuropathy ([place]) 06/15/2019   Abnormal gait 06/11/2020   Adult failure to thrive syndrome 06/11/2020   Allergic rhinitis 06/11/2020   Anxiety 06/11/2020   Asthma 06/11/2020   Benign intracranial hypertension 06/11/2020   Body mass index (BMI) 45.0-49.9, adult ([place]) 06/11/2020   Bowel incontinence 06/11/2020   Cholelithiasis 06/11/2020   Chronic sinusitis 06/11/2020   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   Daytime somnolence 06/11/2020   Diarrhea of presumed infectious origin 06/11/2020   Edema 06/11/2020   Family history of malignant neoplasm of gastrointestinal tract 06/11/2020   Gout 06/11/2020   History of fall 06/11/2020   History of infectious disease 06/11/2020   Insomnia 06/11/2020   Irregular bowel habits 06/11/2020   Irritable bowel syndrome 01/04/2020   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   Spinal stenosis of lumbar region 01/03/2020   Lumbar spondylosis with  myelopathy 06/11/2020   Macrocytosis 06/11/2020   Mild recurrent major depression ([place]) 06/11/2020   Personal history of colonic polyps 06/11/2020   Personal history of malignant neoplasm of breast 06/11/2020   Repeated falls 06/11/2020   Mixed anxiety and depressive disorder 06/11/2020   Other symptoms and signs involving the musculoskeletal system 09/11/2019   Bilateral leg weakness 08/01/2019   Leg swelling 08/01/2019   Lumbago without sciatica 04/30/2021   Decreased estrogen level 08/01/2021   Dementia ([place]) 08/01/2021   Frail elderly 08/01/2021   Hyperparathyroidism ([place]) 08/01/2021   Spondylolisthesis 08/01/2021   Varicose veins of bilateral lower extremities with other complications 17/61/6073   Intractable nausea and vomiting 10/08/2021   AKI (acute kidney injury) ([place]) 10/08/2021   Hyponatremia 10/08/2021   Acute left ankle  pain 10/11/2021   Nausea & vomiting 12/01/2021   Elevated troponin 12/02/2021   Resolved Ambulatory Problems    Diagnosis Date Noted   S/P right TKA 71/10/2692   Complication of anesthesia    Pain, dental 08/31/2018   Past Medical History:  Diagnosis Date   Asthma    Breast cancer, stage 1 ([place]) 03/26/2011   Cardiomyopathy    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    Diabetes mellitus    Gout     SOCIAL HX:  Social History   Tobacco Use   Smoking status: Former    Packs/day: 0.10    Years: 26.00    Total pack years: 2.60    Types: Cigarettes    Quit date: 03/23/2021    Years since quitting: 0.9   Smokeless tobacco: Never  Substance Use Topics   Alcohol use: No     ALLERGIES:  Allergies  Allergen Reactions   Ceftin Anaphylaxis    Face and throat swell    Geodon [Ziprasidone Hcl] Hives   Lisinopril Other (See Comments)    angioedema   Cefuroxime     Other reaction(s): anaphylaxis   Sulfacetamide Sodium-Sulfur     Other reaction(s): itch   Allopurinol Nausea Only and Other (See Comments)    weakness   Ativan [Lorazepam] Itching   Sulfa Antibiotics Itching   Valium [Diazepam] Other (See Comments)    Patient states that diazepam doesn't relax, it has the opposite effect.      PERTINENT MEDICATIONS:  Outpatient Encounter Medications as of 02/26/2022  Medication Sig   acetaminophen (TYLENOL) 500 MG tablet Take 1,000 mg by mouth daily as needed (for pain and fever of 101 or greater).   acetaZOLAMIDE (DIAMOX) 125 MG tablet Take 1 tablet (125 mg total) by mouth daily.   buPROPion (WELLBUTRIN XL) 150 MG 24 hr tablet Take 150 mg by mouth daily.   carvedilol (COREG) 3.125 MG tablet TAKE 1 TABLET (3.125 MG) BY MOUTH TWICE DAILY WITH MEALS   citalopram (CELEXA) 20 MG tablet Take 20 mg by mouth daily.   clonazePAM (KLONOPIN) 0.5 MG tablet Take 0.5 mg by mouth 2 (two) times daily as needed for anxiety.   cyanocobalamin (VITAMIN B12) 1000 MCG tablet Take 1,000 mcg by  mouth daily.   dicyclomine (BENTYL) 20 MG tablet Take 20 mg by mouth 3 (three) times daily before meals.   fluticasone (FLONASE) 50 MCG/ACT nasal spray 1 spray in each nostril   gabapentin (NEURONTIN) 300 MG capsule Take 300 mg by mouth 2 (two) times daily.   levothyroxine (SYNTHROID) 88 MCG tablet TAKE 1 TABLET(88 MCG) BY MOUTH DAILY BEFORE BREAKFAST   metolazone (ZAROXOLYN) 2.5 MG tablet Take 1 tablet by  mouth every other Tuesday.   midodrine (PROAMATINE) 10 MG tablet TAKE 1 TABLET THREE TIMES DAILY WITH MEALS   pantoprazole (PROTONIX) 40 MG tablet Take 40 mg by mouth daily as needed (for acid reflux).   potassium chloride SA (KLOR-CON M) 20 MEQ tablet TAKE 2 TABLETS THREE TIMES DAILY. TAKE AN EXTRA 2 TABLETS ON DAYS YOU TAKE METOLAZONE   simvastatin (ZOCOR) 10 MG tablet Take 1 tablet (10 mg total) by mouth every evening.   spironolactone (ALDACTONE) 25 MG tablet TAKE 1 TABLET (25 MG TOTAL) BY MOUTH EVERY EVENING.   torsemide (DEMADEX) 20 MG tablet Take 4 tablets (80 mg total) by mouth every morning AND 3 tablets (60 mg total) every evening.   No facility-administered encounter medications on file as of 02/26/2022.    Thank you for the opportunity to participate in the care of Ms. Dib.  The palliative care team will continue to follow. Please call our office at [phone] if we can be of additional assistance.   Hollace Kinnier, DO  COVID-19 PATIENT SCREENING TOOL Asked and negative response unless otherwise noted:  Have you had symptoms of covid, tested positive or been in contact with someone with symptoms/positive test in the past 5-10 days? no

## 2022-03-04 ENCOUNTER — Ambulatory Visit (INDEPENDENT_AMBULATORY_CARE_PROVIDER_SITE_OTHER): Payer: Medicare HMO | Admitting: Podiatry

## 2022-03-04 ENCOUNTER — Ambulatory Visit: Payer: Medicare HMO | Admitting: Neurology

## 2022-03-04 DIAGNOSIS — Z91199 Patient's noncompliance with other medical treatment and regimen due to unspecified reason: Secondary | ICD-10-CM

## 2022-03-04 NOTE — Progress Notes (Signed)
1. No-show for appointment     

## 2022-03-06 ENCOUNTER — Other Ambulatory Visit (HOSPITAL_COMMUNITY): Payer: Self-pay

## 2022-03-06 NOTE — Progress Notes (Signed)
Paramedicine Encounter    Patient ID: Rhonda [name], female    DOB: 08-Jun-1950, 71 y.o.   MRN: [id number]  Arrived for home visit for Rhonda [name] who reports feeling okay today. She reports continued shortness of breath on exertion but has been told by PT that this is due to deconditioning. She knows the overall goal is to increase her daily activity and exercise. She hopes to restart the [employer] program after completing in home PT.  Assessment obtained. No lower leg swelling, lungs clear. No distended abdomen. She denied any pain and says her ankle pain improved and she has had no further complications.   Vitals as noted: WT- 191lbs (down 5 lbs) BP- 110/60 HR- 93 O2- 98% RR- 16   I reviewed meds and confirmed same filling pill box for two weeks. No refills needed.   I rescheduled her [facility] appointment for 10/18 and she also sees PCP that day.   I confirmed next home visit for 10/26. She agreed with plan and knows to reach out if needed.   Home visit complete.   [name], [place] 03/06/2022   Patient Care Team: [name], MD as PCP - General (Family Medicine) [name], LCSW as Social Worker (Licensed Clinical Social Worker) [name], [name], DO as Consulting Physician (Neurology) [name], EMT as Paramedic [name] [name], MD as Attending Physician (Family Medicine)  Patient Active Problem List   Diagnosis Date Noted   Elevated troponin 12/02/2021   Nausea & vomiting 12/01/2021   Acute left ankle pain 10/11/2021   Intractable nausea and vomiting 10/08/2021   AKI (acute kidney injury) ([place]) 10/08/2021   Hyponatremia 10/08/2021   Decreased estrogen level 08/01/2021   Dementia ([place]) 08/01/2021   Frail elderly 08/01/2021   Hyperparathyroidism ([place]) 08/01/2021   Spondylolisthesis 08/01/2021   Varicose veins of bilateral lower extremities with other complications 18/84/1660   Lumbago without sciatica  04/30/2021   Abnormal gait 06/11/2020   Adult failure to thrive syndrome 06/11/2020   Allergic rhinitis 06/11/2020   Anxiety 06/11/2020   Asthma 06/11/2020   Benign intracranial hypertension 06/11/2020   Body mass index (BMI) 45.0-49.9, adult ([place]) 06/11/2020   Bowel incontinence 06/11/2020   Cholelithiasis 06/11/2020   Chronic sinusitis 06/11/2020   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   Daytime somnolence 06/11/2020   Diarrhea of presumed infectious origin 06/11/2020   Edema 06/11/2020   Family history of malignant neoplasm of gastrointestinal tract 06/11/2020   Gout 06/11/2020   History of fall 06/11/2020   History of infectious disease 06/11/2020   Insomnia 06/11/2020   Irregular bowel habits 06/11/2020   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   Lumbar spondylosis with myelopathy 06/11/2020   Macrocytosis 06/11/2020   Mild recurrent major depression ([place]) 06/11/2020   Personal history of colonic polyps 06/11/2020   Personal history of malignant neoplasm of breast 06/11/2020   Repeated falls 06/11/2020   Mixed anxiety and depressive disorder 06/11/2020   Irritable bowel syndrome 01/04/2020   Spinal stenosis of lumbar region 01/03/2020   Other symptoms and signs involving the musculoskeletal system 09/11/2019   Bilateral leg weakness 08/01/2019   Leg swelling 08/01/2019   Diabetes mellitus with neuropathy ([place]) 06/15/2019   Hypokalemia 11/08/2018   CKD (chronic kidney disease) stage 3, GFR 30-59 ml/min (HCC) 11/08/2018   Orthostatic hypotension 07/28/2017   SVD (spontaneous vaginal delivery)    Peripheral neuropathy    On home oxygen therapy    Migraines    Left bundle branch block  Hypothyroidism    Hypertension    Hyperlipidemia    Heart murmur    GERD (gastroesophageal reflux disease)    Exertional shortness of breath    Major depressive disorder    Back pain    Arthritis    Generalized anxiety disorder    Anemia    Chronic respiratory failure ([place]) 09/14/2013   Biventricular  ICD (implantable cardioverter-defibrillator) in place 08/04/2013   Morbid obesity ([place]) 25/95/6387   Chronic systolic heart failure ([place]) 10/27/2012   Endometrial polyp 01/20/2012   Malignant tumor of breast ([place]) 03/26/2011   Unspecified vitamin D deficiency 03/26/2011    Current Outpatient Medications:    acetaminophen (TYLENOL) 500 MG tablet, Take 1,000 mg by mouth daily as needed (for pain and fever of 101 or greater)., Disp: , Rfl:    acetaZOLAMIDE (DIAMOX) 125 MG tablet, Take 1 tablet (125 mg total) by mouth daily., Disp: 90 tablet, Rfl: 3   buPROPion (WELLBUTRIN XL) 150 MG 24 hr tablet, Take 150 mg by mouth daily., Disp: , Rfl:    carvedilol (COREG) 3.125 MG tablet, TAKE 1 TABLET (3.125 MG) BY MOUTH TWICE DAILY WITH MEALS, Disp: 180 tablet, Rfl: 3   citalopram (CELEXA) 20 MG tablet, Take 20 mg by mouth daily., Disp: , Rfl:    clonazePAM (KLONOPIN) 0.5 MG tablet, Take 0.5 mg by mouth 2 (two) times daily as needed for anxiety., Disp: , Rfl:    cyanocobalamin (VITAMIN B12) 1000 MCG tablet, Take 1,000 mcg by mouth daily., Disp: , Rfl:    dicyclomine (BENTYL) 20 MG tablet, Take 20 mg by mouth 3 (three) times daily before meals., Disp: , Rfl:    fluticasone (FLONASE) 50 MCG/ACT nasal spray, 1 spray in each nostril, Disp: , Rfl:    gabapentin (NEURONTIN) 300 MG capsule, Take 300 mg by mouth 2 (two) times daily., Disp: , Rfl:    levothyroxine (SYNTHROID) 88 MCG tablet, TAKE 1 TABLET(88 MCG) BY MOUTH DAILY BEFORE BREAKFAST, Disp: 90 tablet, Rfl: 3   metolazone (ZAROXOLYN) 2.5 MG tablet, Take 1 tablet by mouth every other Tuesday., Disp: 12 tablet, Rfl: 3   midodrine (PROAMATINE) 10 MG tablet, TAKE 1 TABLET THREE TIMES DAILY WITH MEALS, Disp: 270 tablet, Rfl: 3   pantoprazole (PROTONIX) 40 MG tablet, Take 40 mg by mouth daily as needed (for acid reflux)., Disp: , Rfl:    potassium chloride SA (KLOR-CON M) 20 MEQ tablet, TAKE 2 TABLETS THREE TIMES DAILY. TAKE AN EXTRA 2 TABLETS ON DAYS YOU TAKE  METOLAZONE, Disp: 572 tablet, Rfl: 0   simvastatin (ZOCOR) 10 MG tablet, Take 1 tablet (10 mg total) by mouth every evening., Disp: 30 tablet, Rfl: 11   spironolactone (ALDACTONE) 25 MG tablet, TAKE 1 TABLET (25 MG TOTAL) BY MOUTH EVERY EVENING., Disp: 90 tablet, Rfl: 0   torsemide (DEMADEX) 20 MG tablet, Take 4 tablets (80 mg total) by mouth every morning AND 3 tablets (60 mg total) every evening., Disp: 210 tablet, Rfl: 11 Allergies  Allergen Reactions   Ceftin Anaphylaxis    Face and throat swell    Geodon [Ziprasidone Hcl] Hives   Lisinopril Other (See Comments)    angioedema   Cefuroxime     Other reaction(s): anaphylaxis   Sulfacetamide Sodium-Sulfur     Other reaction(s): itch   Allopurinol Nausea Only and Other (See Comments)    weakness   Ativan [Lorazepam] Itching   Sulfa Antibiotics Itching   Valium [Diazepam] Other (See Comments)    Patient states that diazepam  doesn't relax, it has the opposite effect.     Social History   Socioeconomic History   Marital status: Married    Spouse name: Not on file   Number of children: 2   Years of education: 32   Highest education level: Master's degree (e.g., MA, MS, MEng, MEd, MSW, MBA)  Occupational History   Occupation: retired  Tobacco Use   Smoking status: Former    Packs/day: 0.10    Years: 26.00    Total pack years: 2.60    Types: Cigarettes    Quit date: 03/23/2021    Years since quitting: 0.9   Smokeless tobacco: Never  Vaping Use   Vaping Use: Never used  Substance and Sexual Activity   Alcohol use: No   Drug use: No   Sexual activity: Yes  Other Topics Concern   Not on file  Social History Narrative   Tobacco Use Cigarettes: Former Smoker, Quit in 2008   No Alcohol   No recreational drug use   Diet: Regular/Low Carb   Exercise: None   Occupation: disabled   Education: Research officer, political party, masters   Children: 2   Firearms: No   Therapist, art Use: Always   Former Metallurgist.    Right handed   Two story  home      Social Determinants of Health   Financial Resource Strain: Not on file  Food Insecurity: Not on file  Transportation Needs: Not on file  Physical Activity: Not on file  Stress: Not on file  Social Connections: Not on file  Intimate Partner Violence: Not on file    Physical Exam      Future Appointments  Date Time Provider [place]  03/09/2022  7:10 AM [facility] DEVICE REMOTES [facility]  03/11/2022  7:15 AM [facility] DEVICE REMOTES [facility]  04/01/2022  1:00 PM Hazle Coca, PhD [facility] None  04/01/2022  2:00 PM [facility] [place] [facility] None  04/08/2022  2:30 PM Hazle Coca, PhD [facility] None  04/15/2022  8:50 AM Narda Amber K, DO [facility] None  06/10/2022  7:15 AM [facility] DEVICE REMOTES [facility]  09/09/2022  7:15 AM [facility] DEVICE REMOTES [facility]  12/09/2022  7:15 AM [facility] DEVICE REMOTES [facility]     ACTION: Home visit completed

## 2022-03-09 ENCOUNTER — Ambulatory Visit (INDEPENDENT_AMBULATORY_CARE_PROVIDER_SITE_OTHER): Payer: Medicare HMO

## 2022-03-09 ENCOUNTER — Telehealth: Payer: Self-pay

## 2022-03-09 DIAGNOSIS — Z95810 Presence of automatic (implantable) cardiac defibrillator: Secondary | ICD-10-CM

## 2022-03-09 DIAGNOSIS — I5022 Chronic systolic (congestive) heart failure: Secondary | ICD-10-CM | POA: Diagnosis not present

## 2022-03-09 NOTE — Telephone Encounter (Signed)
Patient home health nurse called in wanting to know if someone can read her transmission that she is sending in today since she was seen in the ED for SOB and they told her to follow up with her cardiologist. I let her know once transmission is received.

## 2022-03-09 NOTE — Telephone Encounter (Signed)
Currently waiting on transmission

## 2022-03-11 ENCOUNTER — Ambulatory Visit (INDEPENDENT_AMBULATORY_CARE_PROVIDER_SITE_OTHER): Payer: Medicare HMO

## 2022-03-11 ENCOUNTER — Ambulatory Visit: Payer: Medicare HMO | Admitting: Podiatry

## 2022-03-11 DIAGNOSIS — E785 Hyperlipidemia, unspecified: Secondary | ICD-10-CM | POA: Diagnosis not present

## 2022-03-11 DIAGNOSIS — E039 Hypothyroidism, unspecified: Secondary | ICD-10-CM | POA: Diagnosis not present

## 2022-03-11 DIAGNOSIS — B351 Tinea unguium: Secondary | ICD-10-CM | POA: Diagnosis not present

## 2022-03-11 DIAGNOSIS — E1142 Type 2 diabetes mellitus with diabetic polyneuropathy: Secondary | ICD-10-CM

## 2022-03-11 DIAGNOSIS — M79675 Pain in left toe(s): Secondary | ICD-10-CM | POA: Diagnosis not present

## 2022-03-11 DIAGNOSIS — M109 Gout, unspecified: Secondary | ICD-10-CM | POA: Diagnosis not present

## 2022-03-11 DIAGNOSIS — E559 Vitamin D deficiency, unspecified: Secondary | ICD-10-CM | POA: Diagnosis not present

## 2022-03-11 DIAGNOSIS — Z23 Encounter for immunization: Secondary | ICD-10-CM | POA: Diagnosis not present

## 2022-03-11 DIAGNOSIS — E1140 Type 2 diabetes mellitus with diabetic neuropathy, unspecified: Secondary | ICD-10-CM | POA: Diagnosis not present

## 2022-03-11 DIAGNOSIS — Z0000 Encounter for general adult medical examination without abnormal findings: Secondary | ICD-10-CM | POA: Diagnosis not present

## 2022-03-11 DIAGNOSIS — I428 Other cardiomyopathies: Secondary | ICD-10-CM | POA: Diagnosis not present

## 2022-03-11 DIAGNOSIS — E213 Hyperparathyroidism, unspecified: Secondary | ICD-10-CM | POA: Diagnosis not present

## 2022-03-11 DIAGNOSIS — M79674 Pain in right toe(s): Secondary | ICD-10-CM | POA: Diagnosis not present

## 2022-03-11 DIAGNOSIS — R413 Other amnesia: Secondary | ICD-10-CM | POA: Diagnosis not present

## 2022-03-11 LAB — CUP PACEART REMOTE DEVICE CHECK
Battery Remaining Longevity: 71 mo
Battery Voltage: 2.99 V
Brady Statistic AP VP Percent: 0.14 %
Brady Statistic AP VS Percent: 0.01 %
Brady Statistic AS VP Percent: 98.82 %
Brady Statistic AS VS Percent: 1.03 %
Brady Statistic RA Percent Paced: 0.15 %
Brady Statistic RV Percent Paced: 96.29 %
Date Time Interrogation Session: 20231018022724
HighPow Impedance: 73 Ohm
Implantable Lead Implant Date: 20141210
Implantable Lead Implant Date: 20141210
Implantable Lead Implant Date: 20141210
Implantable Lead Location: 753858
Implantable Lead Location: 753859
Implantable Lead Location: 753860
Implantable Lead Model: 4396
Implantable Lead Model: 5076
Implantable Lead Model: 6935
Implantable Pulse Generator Implant Date: 20211118
Lead Channel Impedance Value: 361 Ohm
Lead Channel Impedance Value: 456 Ohm
Lead Channel Impedance Value: 475 Ohm
Lead Channel Impedance Value: 551 Ohm
Lead Channel Impedance Value: 646 Ohm
Lead Channel Impedance Value: 665 Ohm
Lead Channel Pacing Threshold Amplitude: 0.5 V
Lead Channel Pacing Threshold Amplitude: 0.875 V
Lead Channel Pacing Threshold Amplitude: 1.25 V
Lead Channel Pacing Threshold Pulse Width: 0.4 ms
Lead Channel Pacing Threshold Pulse Width: 0.4 ms
Lead Channel Pacing Threshold Pulse Width: 0.4 ms
Lead Channel Sensing Intrinsic Amplitude: 26.375 mV
Lead Channel Sensing Intrinsic Amplitude: 26.375 mV
Lead Channel Sensing Intrinsic Amplitude: 3.375 mV
Lead Channel Sensing Intrinsic Amplitude: 3.375 mV
Lead Channel Setting Pacing Amplitude: 1.5 V
Lead Channel Setting Pacing Amplitude: 2 V
Lead Channel Setting Pacing Amplitude: 2.5 V
Lead Channel Setting Pacing Pulse Width: 0.4 ms
Lead Channel Setting Pacing Pulse Width: 0.4 ms
Lead Channel Setting Sensing Sensitivity: 0.3 mV

## 2022-03-12 DIAGNOSIS — E213 Hyperparathyroidism, unspecified: Secondary | ICD-10-CM | POA: Diagnosis not present

## 2022-03-12 DIAGNOSIS — E039 Hypothyroidism, unspecified: Secondary | ICD-10-CM | POA: Diagnosis not present

## 2022-03-12 DIAGNOSIS — E785 Hyperlipidemia, unspecified: Secondary | ICD-10-CM | POA: Diagnosis not present

## 2022-03-12 DIAGNOSIS — E559 Vitamin D deficiency, unspecified: Secondary | ICD-10-CM | POA: Diagnosis not present

## 2022-03-12 DIAGNOSIS — E1140 Type 2 diabetes mellitus with diabetic neuropathy, unspecified: Secondary | ICD-10-CM | POA: Diagnosis not present

## 2022-03-13 NOTE — Progress Notes (Signed)
Spoke with patient and provided [name], NP at HF clinic recommendations to take 1 Metolazone 2.5 mg tablet and 2 Potassium tablets = 40 mEq today.  Pt wrote down directions and repeated back instructions correctly.   Provided direct ICM number.  Advised will send message to [name], EMT regarding today's recommendations and to have her call me on Monday while she is at patient's house to assist sending manual remote transmission.  Pt also needs a BMET drawn next week.  Also spoke with husband and provided recommendations and scheduled BMET for 10/25 at HF clinic.

## 2022-03-13 NOTE — Progress Notes (Signed)
Received: Today Rhonda [name], Rhonda Bo, FNP  Glyn Zendejas Panda, RN Thanks [name],   She can take metolazone 2.5 mg today + extra 40 KCL. Will need a BMET next week.

## 2022-03-13 NOTE — Progress Notes (Signed)
EPIC Encounter for ICM Monitoring  Patient Name: Rhonda [name] is a 71 y.o. female Date: 03/13/2022 Primary Care Physican: [name], MD Primary Cardiologist: [place] Electrophysiologist: Santina Evans Pacing: 98.7%   03/24/2022  Weight: 191 lbs          Spoke with patient and heart failure questions reviewed.  Transmission results reviewed.  Pt reports SOB when moving around the house.   She reports not feeling well but difficult for her to express specific symptoms other than SOB.  She denies lower extremity edema and unsure of home weight.    Optivol thoracic impedance suggesting possible fluid accumulation starting 9/28.    Prescribed:  Patient participating in paramedicine program to assist with meds. Torsemide 20 mg 4 tablets (80 mg total) by mouth every morning and 3 tablets (60 mg total) every evening.   Potassium 20 meq 3 tablets (60 mEq total) by mouth 3 (three) times daily.  Take extra 2 tablets on days you take Metolazone.   Metolazone 2.'5mg'$  1 tablet every other Tuesday. Spironolactone 25 mg take 1 tablet by mouth every evening.   Labs: 02/22/2022 Creatinine 1.67, BUN 39, Potassium 3.9, Sodium 134, GFR 33 02/04/2022 Creatinine 1.71, BUN 18, Potassium 4.4, Sodium 136, GFR 32  A complete set of results can be found in Results Review.   Recommendations:  Copy sent to [name], NP for review and recommendations.  Meds are prepared by EMT [name].  Pt unsure of her medications.     Follow-up plan: ICM clinic phone appointment on 03/16/2022 (manual) to recheck fluid levels .   91 day device clinic remote transmission 06/10/2022.     EP/Cardiology Office Visits:   Recall 07/06/2022 with Dr Haroldine Laws.  03/25/2022 with Dr Lovena Le.     Copy of ICM check sent to Dr. Lovena Le.  3 month ICM trend: 03/09/2022.    12-14 Month ICM trend:     [name], RN 03/13/2022 9:31 AM

## 2022-03-15 ENCOUNTER — Encounter: Payer: Self-pay | Admitting: Podiatry

## 2022-03-15 NOTE — Progress Notes (Signed)
  Subjective:  Patient ID: Rhonda [name], female    DOB: 17-Apr-1951,  MRN: [id number]  Rhonda [name] presents to clinic today for  Chief Complaint  Patient presents with   Nail Problem    Routine foot care PCP-Kathyrn Timberlake PCP VST-03/11/2022  . New problem(s): None.   PCP is [name], MD.  Allergies  Allergen Reactions   Ceftin Anaphylaxis    Face and throat swell    Geodon [Ziprasidone Hcl] Hives   Lisinopril Other (See Comments)    angioedema   Cefuroxime     Other reaction(s): anaphylaxis   Sulfacetamide Sodium-Sulfur     Other reaction(s): itch   Allopurinol Nausea Only and Other (See Comments)    weakness   Ativan [Lorazepam] Itching   Sulfa Antibiotics Itching   Valium [Diazepam] Other (See Comments)    Patient states that diazepam doesn't relax, it has the opposite effect.    Review of Systems: Negative except as noted in the HPI.  Objective: No changes noted in today's physical examination.  Rhonda [name] is a pleasant 71 y.o. female in NAD. AAO x 3. Vascular Capillary refill time to digits immediate b/l. Faintly palpable pedal pulses b/l. Pedal hair absent. Lower extremity skin temperature gradient within normal limits.  Neurologic Protective sensation diminished with 10g monofilament b/l. Vibratory sensation diminished b/l. Clonus negative b/l.  Dermatologic Pedal skin with normal turgor, texture and tone bilaterally. No open wounds bilaterally. No interdigital macerations bilaterally. Toenails 1-5 b/l elongated, discolored, dystrophic, thickened, crumbly with subungual debris and tenderness to dorsal palpation. No hyperkeratotic nor porokeratotic lesions present on today's visit. Pedal skin noted to be dry and flaky b/l lower extremities.  Orthopedic: Normal muscle strength 5/5 to all lower extremity muscle groups bilaterally. Decreased ankle joint ROM b/l. Pes planus deformity noted b/l.    Radiographs: None  Assessment/Plan: 1. Pain due  to onychomycosis of toenails of both feet   2. Diabetic peripheral neuropathy associated with type 2 diabetes mellitus ([place])     No orders of the defined types were placed in this encounter.   -Patient's family member present. All questions/concerns addressed on today's visit. -Discussed onychomycosis. Advised patient to apply Vick's Vapor Rub to toenails with cotton tipped applicator once daily. -Patient to continue soft, supportive shoe gear daily. -Mycotic toenails 1-5 bilaterally were debrided in length and girth with sterile nail nippers and dremel without incident. -Patient/POA to call should there be question/concern in the interim.   Return in about 3 months (around 06/11/2022).  Marzetta Board, DPM

## 2022-03-16 ENCOUNTER — Telehealth: Payer: Self-pay

## 2022-03-16 ENCOUNTER — Ambulatory Visit (INDEPENDENT_AMBULATORY_CARE_PROVIDER_SITE_OTHER): Payer: Medicare HMO

## 2022-03-16 ENCOUNTER — Other Ambulatory Visit (HOSPITAL_COMMUNITY): Payer: Self-pay

## 2022-03-16 DIAGNOSIS — Z95810 Presence of automatic (implantable) cardiac defibrillator: Secondary | ICD-10-CM

## 2022-03-16 DIAGNOSIS — I5022 Chronic systolic (congestive) heart failure: Secondary | ICD-10-CM

## 2022-03-16 NOTE — Telephone Encounter (Signed)
Received call from [name], [name].  She is currently at patient's house and attempted to send remote transmission for fluid levels review after taking Metolazone last week.  Transmission was not successful.  She stated patient looks very well today.  Pts weight today is 192 lbs.  She confirmed patient took extra Metolazone last week with potassium as recommended.  Pt did miss an evening and morning dose of Torsemide last week which may have contributed to fluid accumulation.  Provided carelink tech support number if assistance is needed with monitor.  Pt is not reporting any fluid symptoms today.

## 2022-03-16 NOTE — Progress Notes (Signed)
Paramedicine Encounter    Patient ID: Rhonda [name], female    DOB: 01-Mar-1951, 71 y.o.   MRN: [id number]  Arrived for home visit for Rhonda [name] who reports to be feeling okay today. She had some issues last week with fluid overload and had to take metolazone and potassium per HF clinic after device clinic investigated same.   Patient was told by PCP to hydrate last Wednesday after they had failed lab attempts- this on top of missed medication doses more than likely contributed to her increase in volume.   We discussed diet and fluid restrictions/suggestions today.   This week she says her shortness of breath is better and she has no lower leg swelling. Weight is only up one pound this week. Vitals obtained.  BP- 98/62 HR- 78 O2- 98% RR-16   No lower leg edema.  Lungs clear.  No shortness of breath noted upon her walking to the scale to get her weight.   I attempted to assist in transmitting her [employer] device however there were some connection issues which I made the device clinic aware of and they are planning to pull an automated transmission tomorrow and if unsuccessful they will let me and the patient know so we can call [employer] tech support.   Rhonda [name] was made aware of her upcoming appointments. I wrote down same.   No refills needed at this time.   I plan to see her in one week unless there are med changes after her labs Wednesday. She is aware and agreeable.   Home visit complete.   Rhonda [name], EMT-Paramedic [phone] 03/16/2022   Patient Care Team: Rhonda Smoker, MD as PCP - General (Family Medicine) Rhonda Ny, LCSW as Social Worker (Licensed Clinical Social Worker) Rhonda [name], Rhonda Serve, DO as Consulting Physician (Neurology) Rhonda [name], EMT as Paramedic [name] [name], MD as Attending Physician (Family Medicine)  Patient Active Problem List   Diagnosis Date Noted   Elevated troponin 12/02/2021   Nausea & vomiting 12/01/2021    Acute left ankle pain 10/11/2021   Intractable nausea and vomiting 10/08/2021   AKI (acute kidney injury) ([place]) 10/08/2021   Hyponatremia 10/08/2021   Decreased estrogen level 08/01/2021   Dementia ([place]) 08/01/2021   Frail elderly 08/01/2021   Hyperparathyroidism ([place]) 08/01/2021   Spondylolisthesis 08/01/2021   Varicose veins of bilateral lower extremities with other complications 94/85/4627   Lumbago without sciatica 04/30/2021   Abnormal gait 06/11/2020   Adult failure to thrive syndrome 06/11/2020   Allergic rhinitis 06/11/2020   Anxiety 06/11/2020   Asthma 06/11/2020   Benign intracranial hypertension 06/11/2020   Body mass index (BMI) 45.0-49.9, adult ([place]) 06/11/2020   Bowel incontinence 06/11/2020   Cholelithiasis 06/11/2020   Chronic sinusitis 06/11/2020   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   Daytime somnolence 06/11/2020   Diarrhea of presumed infectious origin 06/11/2020   Edema 06/11/2020   Family history of malignant neoplasm of gastrointestinal tract 06/11/2020   Gout 06/11/2020   History of fall 06/11/2020   History of infectious disease 06/11/2020   Insomnia 06/11/2020   Irregular bowel habits 06/11/2020   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   Lumbar spondylosis with myelopathy 06/11/2020   Macrocytosis 06/11/2020   Mild recurrent major depression ([place]) 06/11/2020   Personal history of colonic polyps 06/11/2020   Personal history of malignant neoplasm of breast 06/11/2020   Repeated falls 06/11/2020   Mixed anxiety and depressive disorder 06/11/2020   Irritable bowel syndrome 01/04/2020   Spinal stenosis of lumbar region 01/03/2020  Other symptoms and signs involving the musculoskeletal system 09/11/2019   Bilateral leg weakness 08/01/2019   Leg swelling 08/01/2019   Diabetes mellitus with neuropathy ([place]) 06/15/2019   Hypokalemia 11/08/2018   CKD (chronic kidney disease) stage 3, GFR 30-59 ml/min (HCC) 11/08/2018   Orthostatic hypotension 07/28/2017   SVD (spontaneous  vaginal delivery)    Peripheral neuropathy    On home oxygen therapy    Migraines    Left bundle branch block    Hypothyroidism    Hypertension    Hyperlipidemia    Heart murmur    GERD (gastroesophageal reflux disease)    Exertional shortness of breath    Major depressive disorder    Back pain    Arthritis    Generalized anxiety disorder    Anemia    Chronic respiratory failure ([place]) 09/14/2013   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   Morbid obesity ([place]) 96/28/3662   Chronic systolic heart failure ([place]) 10/27/2012   Endometrial polyp 01/20/2012   Malignant tumor of breast ([place]) 03/26/2011   Unspecified vitamin D deficiency 03/26/2011    Current Outpatient Medications:    acetaminophen (TYLENOL) 500 MG tablet, Take 1,000 mg by mouth daily as needed (for pain and fever of 101 or greater)., Disp: , Rfl:    acetaZOLAMIDE (DIAMOX) 125 MG tablet, Take 1 tablet (125 mg total) by mouth daily., Disp: 90 tablet, Rfl: 3   buPROPion (WELLBUTRIN XL) 150 MG 24 hr tablet, Take 150 mg by mouth daily., Disp: , Rfl:    carvedilol (COREG) 3.125 MG tablet, TAKE 1 TABLET (3.125 MG) BY MOUTH TWICE DAILY WITH MEALS, Disp: 180 tablet, Rfl: 3   citalopram (CELEXA) 20 MG tablet, Take 20 mg by mouth daily., Disp: , Rfl:    clonazePAM (KLONOPIN) 0.5 MG tablet, Take 0.5 mg by mouth 2 (two) times daily as needed for anxiety., Disp: , Rfl:    cyanocobalamin (VITAMIN B12) 1000 MCG tablet, Take 1,000 mcg by mouth daily., Disp: , Rfl:    dicyclomine (BENTYL) 20 MG tablet, Take 20 mg by mouth 3 (three) times daily before meals., Disp: , Rfl:    fluticasone (FLONASE) 50 MCG/ACT nasal spray, 1 spray in each nostril, Disp: , Rfl:    gabapentin (NEURONTIN) 300 MG capsule, Take 300 mg by mouth 2 (two) times daily., Disp: , Rfl:    levothyroxine (SYNTHROID) 88 MCG tablet, TAKE 1 TABLET(88 MCG) BY MOUTH DAILY BEFORE BREAKFAST, Disp: 90 tablet, Rfl: 3   metolazone (ZAROXOLYN)  2.5 MG tablet, Take 1 tablet by mouth every other Tuesday., Disp: 12 tablet, Rfl: 3   midodrine (PROAMATINE) 10 MG tablet, TAKE 1 TABLET THREE TIMES DAILY WITH MEALS, Disp: 270 tablet, Rfl: 3   pantoprazole (PROTONIX) 40 MG tablet, Take 40 mg by mouth daily as needed (for acid reflux)., Disp: , Rfl:    potassium chloride SA (KLOR-CON M) 20 MEQ tablet, TAKE 2 TABLETS THREE TIMES DAILY. TAKE AN EXTRA 2 TABLETS ON DAYS YOU TAKE METOLAZONE, Disp: 572 tablet, Rfl: 0   simvastatin (ZOCOR) 10 MG tablet, Take 1 tablet (10 mg total) by mouth every evening., Disp: 30 tablet, Rfl: 11   spironolactone (ALDACTONE) 25 MG tablet, TAKE 1 TABLET (25 MG TOTAL) BY MOUTH EVERY EVENING., Disp: 90 tablet, Rfl: 0   torsemide (DEMADEX) 20 MG tablet, Take 4 tablets (80 mg total) by mouth every morning AND 3 tablets (60 mg total) every evening., Disp: 210 tablet, Rfl: 11 Allergies  Allergen Reactions   Ceftin Anaphylaxis  Face and throat swell    Geodon [Ziprasidone Hcl] Hives   Lisinopril Other (See Comments)    angioedema   Cefuroxime     Other reaction(s): anaphylaxis   Sulfacetamide Sodium-Sulfur     Other reaction(s): itch   Allopurinol Nausea Only and Other (See Comments)    weakness   Ativan [Lorazepam] Itching   Sulfa Antibiotics Itching   Valium [Diazepam] Other (See Comments)    Patient states that diazepam doesn't relax, it has the opposite effect.     Social History   Socioeconomic History   Marital status: Married    Spouse name: Not on file   Number of children: 2   Years of education: 63   Highest education level: Master's degree (e.g., MA, MS, MEng, MEd, MSW, MBA)  Occupational History   Occupation: retired  Tobacco Use   Smoking status: Former    Packs/day: 0.10    Years: 26.00    Total pack years: 2.60    Types: Cigarettes    Quit date: 03/23/2021    Years since quitting: 0.9   Smokeless tobacco: Never  Vaping Use   Vaping Use: Never used  Substance and Sexual Activity    Alcohol use: No   Drug use: No   Sexual activity: Yes  Other Topics Concern   Not on file  Social History Narrative   Tobacco Use Cigarettes: Former [name], Quit in 2008   No Alcohol   No recreational drug use   Diet: Regular/Low Carb   Exercise: None   Occupation: disabled   Education: Research officer, political party, masters   Children: 2   Firearms: No   Therapist, art Use: 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      Social Determinants of Health   Financial Resource Strain: Not on file  Food Insecurity: Not on file  Transportation Needs: Not on file  Physical Activity: Not on file  Stress: Not on file  Social Connections: Not on file  Intimate Partner Violence: Not on file    Physical Exam      Future Appointments  Date Time Provider [place]  03/18/2022  3:45 PM [facility] None  03/25/2022  2:45 PM Baldwin [place], PA-C [facility]  04/01/2022  1:00 PM Hazle Coca, PhD [facility] None  04/01/2022  2:00 PM LBN- NEUROPSYCH TECH [facility] None  04/08/2022  2:30 PM Hazle Coca, PhD [facility] None  04/15/2022  8:50 AM Narda Amber K, DO [facility] None  06/10/2022  7:15 AM [facility] DEVICE REMOTES [facility]  06/30/2022  2:00 PM Marzetta Board, DPM [facility]  09/09/2022  7:15 AM [facility] DEVICE REMOTES [facility]  12/09/2022  7:15 AM [facility] DEVICE REMOTES [facility]     ACTION: Home visit completed

## 2022-03-17 ENCOUNTER — Telehealth: Payer: Self-pay

## 2022-03-17 NOTE — Telephone Encounter (Signed)
Remote ICM transmission received.  Attempted call to patient regarding ICM remote transmission and no answer.  

## 2022-03-17 NOTE — Progress Notes (Signed)
EPIC Encounter for ICM Monitoring  Patient Name: Rhonda [name] is a 71 y.o. female Date: 03/17/2022 Primary Care Physican: [name], MD Primary Cardiologist: Bensimhon Electrophysiologist: Santina Evans Pacing: 98.9%   03/24/2022  Weight: 191 lbs          Attempted call to patient and unable to reach.  Transmission reviewed.    Optivol thoracic impedance suggesting fluid levels returned to normal after taking extra Metolazone and Potassium as recommended by [name], NP.    Prescribed:  Patient participating in paramedicine program to assist with meds. Torsemide 20 mg 4 tablets (80 mg total) by mouth every morning and 3 tablets (60 mg total) every evening.   Potassium 20 meq 3 tablets (60 mEq total) by mouth 3 (three) times daily.  Take extra 2 tablets on days you take Metolazone.   Metolazone 2.'5mg'$  1 tablet every other Tuesday. Spironolactone 25 mg take 1 tablet by mouth every evening.   Labs: 02/22/2022 Creatinine 1.67, BUN 39, Potassium 3.9, Sodium 134, GFR 33 02/04/2022 Creatinine 1.71, BUN 18, Potassium 4.4, Sodium 136, GFR 32  A complete set of results can be found in Results Review.   Recommendations:  Unable to reach.      Follow-up plan: ICM clinic phone appointment on 04/13/2022.   91 day device clinic remote transmission 06/10/2022.     EP/Cardiology Office Visits:   Recall 07/06/2022 with Dr Haroldine Laws.  03/25/2022 with Dr Lovena Le.     Copy of ICM check sent to Dr. Lovena Le.  3 month ICM trend: 03/17/2022.    12-14 Month ICM trend:     [name], RN 03/17/2022 1:53 PM

## 2022-03-18 ENCOUNTER — Ambulatory Visit (HOSPITAL_COMMUNITY)
Admission: RE | Admit: 2022-03-18 | Discharge: 2022-03-18 | Disposition: A | Discharge status: Home / Self Care | Payer: Medicare HMO | Source: Ambulatory Visit | Attending: Cardiology | Admitting: Cardiology

## 2022-03-18 DIAGNOSIS — I5022 Chronic systolic (congestive) heart failure: Secondary | ICD-10-CM | POA: Diagnosis not present

## 2022-03-18 LAB — BASIC METABOLIC PANEL
Anion gap: 10 (ref 5–15)
BUN: 43 mg/dL — ABNORMAL HIGH (ref 8–23)
CO2: 26 mmol/L (ref 22–32)
Calcium: 9.2 mg/dL (ref 8.9–10.3)
Chloride: 98 mmol/L (ref 98–111)
Creatinine, Ser: 2.12 mg/dL — ABNORMAL HIGH (ref 0.44–1.00)
GFR, Estimated: 25 mL/min — ABNORMAL LOW (ref >60)
Glucose, Bld: 113 mg/dL — ABNORMAL HIGH (ref 70–99)
Potassium: 4.4 mmol/L (ref 3.5–5.1)
Sodium: 134 mmol/L — ABNORMAL LOW (ref 135–145)

## 2022-03-23 ENCOUNTER — Other Ambulatory Visit (HOSPITAL_COMMUNITY): Payer: Self-pay

## 2022-03-23 NOTE — Progress Notes (Signed)
Paramedicine Encounter    Patient ID: Rhonda [name], female    DOB: 25-Jan-1951, 71 y.o.   MRN: [id number]  Arrived for home visit for Rhonda [name] who ambulated to meet me at the door with no shortness of breath or difficulty walking. She reports she has a headache today but denied dizziness or chest pain or shortness of breath. No lower leg swelling noted. Lungs clear on assessment. Weight taken during visit was 193lbs.   She has been compliant with her medications only missing one noon dose last Wednesday.   I reviewed meds and filled pill box for one week. Refills needed: Torsemide  I will call same into [facility].   Rhonda [name] denied any other complaints other than her headache which she says wasn't relieved with Tylenol. I advised her to ensure she has eaten today and she reports she has not- I encouraged to be sure she eats at least two to three meals daily. She verbalized understanding.   I reviewed and wrote down upcoming appointments on her calendar. Confirming same.   I plan to see Rhonda [name] in one week. She agreed and knows to reach out if needed. Home visit complete.    [name], [place] 03/23/2022    Patient Care Team: [name], MD as PCP - General (Family Medicine) [name], LCSW as Social Worker (Licensed Clinical Social Worker) [name], [name], DO as Consulting Physician (Neurology) [name], EMT as Paramedic [name] [name], MD as Attending Physician (Family Medicine)  Patient Active Problem List   Diagnosis Date Noted   Elevated troponin 12/02/2021   Nausea & vomiting 12/01/2021   Acute left ankle pain 10/11/2021   Intractable nausea and vomiting 10/08/2021   AKI (acute kidney injury) ([place]) 10/08/2021   Hyponatremia 10/08/2021   Decreased estrogen level 08/01/2021   Dementia ([place]) 08/01/2021   Frail elderly 08/01/2021   Hyperparathyroidism ([place]) 08/01/2021   Spondylolisthesis 08/01/2021   Varicose  veins of bilateral lower extremities with other complications 58/01/9832   Lumbago without sciatica 04/30/2021   Abnormal gait 06/11/2020   Adult failure to thrive syndrome 06/11/2020   Allergic rhinitis 06/11/2020   Anxiety 06/11/2020   Asthma 06/11/2020   Benign intracranial hypertension 06/11/2020   Body mass index (BMI) 45.0-49.9, adult ([place]) 06/11/2020   Bowel incontinence 06/11/2020   Cholelithiasis 06/11/2020   Chronic sinusitis 06/11/2020   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   Daytime somnolence 06/11/2020   Diarrhea of presumed infectious origin 06/11/2020   Edema 06/11/2020   Family history of malignant neoplasm of gastrointestinal tract 06/11/2020   Gout 06/11/2020   History of fall 06/11/2020   History of infectious disease 06/11/2020   Insomnia 06/11/2020   Irregular bowel habits 06/11/2020   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   Lumbar spondylosis with myelopathy 06/11/2020   Macrocytosis 06/11/2020   Mild recurrent major depression ([place]) 06/11/2020   Personal history of colonic polyps 06/11/2020   Personal history of malignant neoplasm of breast 06/11/2020   Repeated falls 06/11/2020   Mixed anxiety and depressive disorder 06/11/2020   Irritable bowel syndrome 01/04/2020   Spinal stenosis of lumbar region 01/03/2020   Other symptoms and signs involving the musculoskeletal system 09/11/2019   Bilateral leg weakness 08/01/2019   Leg swelling 08/01/2019   Diabetes mellitus with neuropathy ([place]) 06/15/2019   Hypokalemia 11/08/2018   CKD (chronic kidney disease) stage 3, GFR 30-59 ml/min (HCC) 11/08/2018   Orthostatic hypotension 07/28/2017   SVD (spontaneous vaginal delivery)    Peripheral neuropathy  On home oxygen therapy    Migraines    Left bundle branch block    Hypothyroidism    Hypertension    Hyperlipidemia    Heart murmur    GERD (gastroesophageal reflux disease)    Exertional shortness of breath    Major depressive disorder    Back pain    Arthritis     Generalized anxiety disorder    Anemia    Chronic respiratory failure ([place]) 09/14/2013   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   Morbid obesity ([place]) 78/67/6720   Chronic systolic heart failure ([place]) 10/27/2012   Endometrial polyp 01/20/2012   Malignant tumor of breast ([place]) 03/26/2011   Unspecified vitamin D deficiency 03/26/2011    Current Outpatient Medications:    acetaminophen (TYLENOL) 500 MG tablet, Take 1,000 mg by mouth daily as needed (for pain and fever of 101 or greater)., Disp: , Rfl:    acetaZOLAMIDE (DIAMOX) 125 MG tablet, Take 1 tablet (125 mg total) by mouth daily., Disp: 90 tablet, Rfl: 3   buPROPion (WELLBUTRIN XL) 150 MG 24 hr tablet, Take 150 mg by mouth daily., Disp: , Rfl:    carvedilol (COREG) 3.125 MG tablet, TAKE 1 TABLET (3.125 MG) BY MOUTH TWICE DAILY WITH MEALS, Disp: 180 tablet, Rfl: 3   citalopram (CELEXA) 20 MG tablet, Take 20 mg by mouth daily., Disp: , Rfl:    clonazePAM (KLONOPIN) 0.5 MG tablet, Take 0.5 mg by mouth 2 (two) times daily as needed for anxiety., Disp: , Rfl:    cyanocobalamin (VITAMIN B12) 1000 MCG tablet, Take 1,000 mcg by mouth daily., Disp: , Rfl:    dicyclomine (BENTYL) 20 MG tablet, Take 20 mg by mouth 3 (three) times daily before meals., Disp: , Rfl:    fluticasone (FLONASE) 50 MCG/ACT nasal spray, 1 spray in each nostril, Disp: , Rfl:    gabapentin (NEURONTIN) 300 MG capsule, Take 300 mg by mouth 2 (two) times daily., Disp: , Rfl:    levothyroxine (SYNTHROID) 88 MCG tablet, TAKE 1 TABLET(88 MCG) BY MOUTH DAILY BEFORE BREAKFAST, Disp: 90 tablet, Rfl: 3   metolazone (ZAROXOLYN) 2.5 MG tablet, Take 1 tablet by mouth every other Tuesday., Disp: 12 tablet, Rfl: 3   midodrine (PROAMATINE) 10 MG tablet, TAKE 1 TABLET THREE TIMES DAILY WITH MEALS, Disp: 270 tablet, Rfl: 3   pantoprazole (PROTONIX) 40 MG tablet, Take 40 mg by mouth daily as needed (for acid reflux)., Disp: , Rfl:    potassium chloride SA  (KLOR-CON M) 20 MEQ tablet, TAKE 2 TABLETS THREE TIMES DAILY. TAKE AN EXTRA 2 TABLETS ON DAYS YOU TAKE METOLAZONE, Disp: 572 tablet, Rfl: 0   simvastatin (ZOCOR) 10 MG tablet, Take 1 tablet (10 mg total) by mouth every evening., Disp: 30 tablet, Rfl: 11   spironolactone (ALDACTONE) 25 MG tablet, TAKE 1 TABLET (25 MG TOTAL) BY MOUTH EVERY EVENING., Disp: 90 tablet, Rfl: 0   torsemide (DEMADEX) 20 MG tablet, Take 4 tablets (80 mg total) by mouth every morning AND 3 tablets (60 mg total) every evening., Disp: 210 tablet, Rfl: 11 Allergies  Allergen Reactions   Ceftin Anaphylaxis    Face and throat swell    Geodon [Ziprasidone Hcl] Hives   Lisinopril Other (See Comments)    angioedema   Cefuroxime     Other reaction(s): anaphylaxis   Sulfacetamide Sodium-Sulfur     Other reaction(s): itch   Allopurinol Nausea Only and Other (See Comments)    weakness   Ativan [Lorazepam] Itching  Sulfa Antibiotics Itching   Valium [Diazepam] Other (See Comments)    Patient states that diazepam doesn't relax, it has the opposite effect.     Social History   Socioeconomic History   Marital status: Married    Spouse name: Not on file   Number of children: 2   Years of education: 58   Highest education level: Master's degree (e.g., MA, MS, MEng, MEd, MSW, MBA)  Occupational History   Occupation: retired  Tobacco Use   Smoking status: Former    Packs/day: 0.10    Years: 26.00    Total pack years: 2.60    Types: Cigarettes    Quit date: 03/23/2021    Years since quitting: 1.0   Smokeless tobacco: Never  Vaping Use   Vaping Use: Never used  Substance and Sexual Activity   Alcohol use: No   Drug use: No   Sexual activity: Yes  Other Topics Concern   Not on file  Social History Narrative   Tobacco Use Cigarettes: Former Smoker, Quit in 2008   No Alcohol   No recreational drug use   Diet: Regular/Low Carb   Exercise: None   Occupation: disabled   Education: Research officer, political party, masters    Children: 2   Firearms: No   Therapist, art Use: 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      Social Determinants of Health   Financial Resource Strain: Not on file  Food Insecurity: Not on file  Transportation Needs: Not on file  Physical Activity: Not on file  Stress: Not on file  Social Connections: Not on file  Intimate Partner Violence: Not on file    Physical Exam      Future Appointments  Date Time Provider [place]  03/25/2022  2:45 PM Baldwin [place], PA-C [facility]  04/01/2022  1:00 PM Hazle Coca, PhD [facility] None  04/01/2022  2:00 PM LBN- [place] [facility] None  04/08/2022  2:30 PM Hazle Coca, PhD [facility] None  04/13/2022  7:25 AM [facility] DEVICE REMOTES [facility]  04/15/2022  8:50 AM Narda Amber K, DO [facility] None  06/10/2022  7:15 AM [facility] DEVICE REMOTES [facility]  06/30/2022  2:00 PM Marzetta Board, DPM [facility]  09/09/2022  7:15 AM [facility] DEVICE REMOTES [facility]  12/09/2022  7:15 AM [facility] DEVICE REMOTES [facility]     ACTION: Home visit completed

## 2022-03-23 NOTE — Progress Notes (Unsigned)
Cardiology Office Note Date:  03/23/2022  Patient ID:  Rhonda [name], Rhonda [name] 1951-03-04, MRN [id number] PCP:  [name], MD  Cardiologist:  Dr. Haroldine Laws Electrophysiologist: Dr. Lovena Le  ***refresh   Chief Complaint: *** overdue  History of Present Illness: Rhonda [name] is a 71 y.o. 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 She has had sleep study with no  evidence of sleep apnea in remote past. Orthostatic hypotension (has been on/off both florinef and midodrine, historically failed mestinon), follwed by neurology CKD (III), follows with Dr. Joelyn Oms,  venous stasis  She saw Dr. Lovena Le march 2022, weight was up, admitted to dietary indiscretions, particularly with chips.  Following with HF team regularly as well as L. Short, RN. Paramedicine sees her also.  Most recently saw Rudi Heap, NP 01/07/22, able to do her ADLs without out too much SOB, husband helps her.  Forgets her meds. Encouraged to get back to some exercise as able, discussed the PREP program, advised compression stockings Neurology following her orthostatic management  ***   Device information MDT CRT-D implanted 05/03/2013, gen change 04/11/2020   Past Medical History:  Diagnosis Date   Anemia    Arthritis    Right knee   Asthma    Back pain    Disk problem   Breast cancer, stage 1 ([place]) 03/26/2011   Left; completed chemotherapy and radiation treatments   Cardiomyopathy    Chronic respiratory failure ([place]) 81/27/5170   Chronic systolic heart failure (HCC)    a) NICM b) ECHO (03/2013) EF 20-25% c) ECHO (09/2013) EF 45-50%, grade I DD   CKD (chronic kidney  disease) stage 3, GFR 30-59 ml/min (HCC) 11/08/2018   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    Complication of anesthesia    History of low blood pressure after surgery; attributed to lying flat   Diabetes mellitus    "diet controlled" (05/03/2013)   Exertional shortness of breath    Generalized anxiety disorder    GERD (gastroesophageal reflux disease)    Gout    Heart murmur    Hyperlipidemia    Hypertension    Hypokalemia 11/08/2018   Hypothyroidism    Left bundle branch block    s/p CRT-D (04/2013)   Major depressive disorder    Migraines    On home oxygen therapy    "2L suppose to be q night" (05/03/2013)   Orthostatic hypotension 07/28/2017   Peripheral neuropathy    Feet   SVD (spontaneous vaginal delivery)    x 2   Unspecified vitamin D deficiency 03/26/2011   Does not take meds    Past Surgical History:  Procedure Laterality Date   BI-VENTRICULAR IMPLANTABLE CARDIOVERTER DEFIBRILLATOR N/A 05/03/2013   Procedure: BI-VENTRICULAR IMPLANTABLE CARDIOVERTER DEFIBRILLATOR  (CRT-D);  Surgeon: Evans Lance, MD;  Location: [facility] CATH LAB;  Service: Cardiovascular;  Laterality: N/A;   BI-VENTRICULAR IMPLANTABLE CARDIOVERTER DEFIBRILLATOR  (CRT-D)  05/03/2013   MDT CRTD implanted by Dr Lovena Le for non ischemic cardiomyopathy   BIOPSY  05/31/2018   Procedure: BIOPSY;  Surgeon: Ronnette Juniper, MD;  Location: [facility] END[facility]COPY;  Service: Gastroenterology;;   BIV ICD [place] N/A 04/11/2020   Procedure: BIV  ICD GENERATOR CHANGEOUT;  Surgeon: Evans Lance, MD;  Location: [facility] CV LAB;  Service: Cardiovascular;  Laterality: N/A;   BREAST LUMPECTOMY Left 07/28/2010   CARDIAC CATHETERIZATION  2010   NORMAL CORONARY ARTERIES   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--11 SURGERIES     PT HAS REMOVABLE SPEECH BULB-TAKES IT OUT BEFORE HER SURGERY   COLONOSCOPY     COLONOSCOPY N/A 05/31/2018   Procedure: COLONOSCOPY;  Surgeon: Ronnette Juniper, MD;  Location: [facility] ENDOSCOPY;  Service:  Gastroenterology;  Laterality: N/A;   HYSTEROSCOPY WITH D & C  01/20/2012   Procedure: DILATATION AND CURETTAGE /HYSTEROSCOPY;  Surgeon: Margarette Asal, MD;  Location: [facility] ORS;  Service: Gynecology;  Laterality: N/A;  with trueclear   PORT-A-CATH REMOVAL  09/23/2011   Procedure: REMOVAL PORT-A-CATH;  Surgeon: Rolm Bookbinder, MD;  Location: [facility] ORS;  Service: General;  Laterality: N/A;  Port Removal   PORTACATH PLACEMENT  2012   TONSILLECTOMY  1960's   TOTAL KNEE ARTHROPLASTY  05/10/2012   Procedure: TOTAL KNEE ARTHROPLASTY;  Surgeon: Mauri Pole, MD;  Location: [facility] ORS;  Service: Orthopedics;  Laterality: Right;   WISDOM TOOTH EXTRACTION      Current Outpatient Medications  Medication Sig Dispense Refill   acetaminophen (TYLENOL) 500 MG tablet Take 1,000 mg by mouth daily as needed (for pain and fever of 101 or greater).     acetaZOLAMIDE (DIAMOX) 125 MG tablet Take 1 tablet (125 mg total) by mouth daily. 90 tablet 3   buPROPion (WELLBUTRIN XL) 150 MG 24 hr tablet Take 150 mg by mouth daily.     carvedilol (COREG) 3.125 MG tablet TAKE 1 TABLET (3.125 MG) BY MOUTH TWICE DAILY WITH MEALS 180 tablet 3   citalopram (CELEXA) 20 MG tablet Take 20 mg by mouth daily.     clonazePAM (KLONOPIN) 0.5 MG tablet Take 0.5 mg by mouth 2 (two) times daily as needed for anxiety.     cyanocobalamin (VITAMIN B12) 1000 MCG tablet Take 1,000 mcg by mouth daily.     dicyclomine (BENTYL) 20 MG tablet Take 20 mg by mouth 3 (three) times daily before meals.     fluticasone (FLONASE) 50 MCG/ACT nasal spray 1 spray in each nostril     gabapentin (NEURONTIN) 300 MG capsule Take 300 mg by mouth 2 (two) times daily.     levothyroxine (SYNTHROID) 88 MCG tablet TAKE 1 TABLET(88 MCG) BY MOUTH DAILY BEFORE BREAKFAST 90 tablet 3   metolazone (ZAROXOLYN) 2.5 MG tablet Take 1 tablet by mouth every other Tuesday. 12 tablet 3   midodrine (PROAMATINE) 10 MG tablet TAKE 1 TABLET THREE TIMES DAILY WITH MEALS 270 tablet 3    pantoprazole (PROTONIX) 40 MG tablet Take 40 mg by mouth daily as needed (for acid reflux).     potassium chloride SA (KLOR-CON M) 20 MEQ tablet TAKE 2 TABLETS THREE TIMES DAILY. TAKE AN EXTRA 2 TABLETS ON DAYS YOU TAKE METOLAZONE 572 tablet 0   simvastatin (ZOCOR) 10 MG tablet Take 1 tablet (10 mg total) by mouth every evening. 30 tablet 11   spironolactone (ALDACTONE) 25 MG tablet TAKE 1 TABLET (25 MG TOTAL) BY MOUTH EVERY EVENING. 90 tablet 0   torsemide (DEMADEX) 20 MG tablet Take 4 tablets (80 mg total) by mouth every morning AND 3 tablets (60 mg total) every evening. 210 tablet 11   No current facility-administered medications for this visit.    Allergies:   Ceftin, Geodon [ziprasidone hcl], Lisinopril, Cefuroxime, Sulfacetamide sodium-sulfur, Allopurinol, Ativan [  lorazepam], Sulfa antibiotics, and Valium [diazepam]   Social History:  The patient  reports that she quit smoking about a year ago. Her smoking use included cigarettes. She has a 2.60 pack-year smoking history. She has never used smokeless tobacco. She reports that she does not drink alcohol and does not use drugs.   Family History:  The patient's family history includes Alzheimer's disease in her mother; Birth defects in her paternal uncle; CVA in her mother; Cancer - Colon in her father; Hypertension in her mother.***  ROS:  Please see the history of present illness.    All other systems are reviewed and otherwise negative.   PHYSICAL EXAM:  VS:  There were no vitals taken for this visit. BMI: There is no height or weight on file to calculate BMI. Well nourished, well developed, in no acute distress HEENT: normocephalic, atraumatic Neck: no JVD, carotid bruits or masses Cardiac:  *** RRR; no significant murmurs, no rubs, or gallops Lungs:  *** CTA b/l, no wheezing, rhonchi or rales Abd: soft, nontender MS: no deformity or *** atrophy Ext: *** no edema Skin: warm and dry, no rash Neuro:  No gross deficits  appreciated Psych: euthymic mood, full affect  *** PPM/ICD site is stable, no tethering or discomfort   EKG:  Done today and reviewed by myself shows  ***  Device interrogation done today and reviewed by myself:  ***  - Echo (5/14): EF 35% RV ok  - Echo (11/14): EF 20-25%, LV moderately dilated - Echo (5/15): EF 45-50% - Echo (7/18): EF 55-60%, normal RV size and systolic function, PASP 34 mmHg   - PYP (4/19): negative TTR - Echo (1/21): EF 60-65% grade II DD. RV ok - Echo (1/23): EF 60-65%, RV ok     Recent Labs: 10/08/2021: TSH 1.127 12/02/2021: ALT 14 12/03/2021: Magnesium 2.3 02/22/2022: B Natriuretic Peptide 29.8; Hemoglobin 13.1; Platelets 328 03/18/2022: BUN 43; Creatinine, Ser 2.12; Potassium 4.4; Sodium 134  No results found for requested labs within last 365 days.   Estimated Creatinine Clearance: 26.2 mL/min (A) (by C-G formula based on SCr of 2.12 mg/dL (H)).   Wt Readings from Last 3 Encounters:  03/16/22 192 lb (87.1 kg)  03/06/22 191 lb (86.6 kg)  02/26/22 196 lb 14.4 oz (89.3 kg)     Other studies reviewed: Additional studies/records reviewed today include: summarized above  ASSESSMENT AND PLAN:  ***  Disposition: F/u with ***  Current medicines are reviewed at length with the patient today.  The patient did not have any concerns regarding medicines.  Venetia Night, PA-C 03/23/2022 1:47 PM     [address] [place] [place] [address] [phone] (office)  [phone] (fax)

## 2022-03-24 ENCOUNTER — Telehealth (HOSPITAL_COMMUNITY): Payer: Self-pay

## 2022-03-24 DIAGNOSIS — I5022 Chronic systolic (congestive) heart failure: Secondary | ICD-10-CM

## 2022-03-24 NOTE — Progress Notes (Signed)
Remote ICD transmission.   

## 2022-03-24 NOTE — Telephone Encounter (Signed)
Patient advised and verbalized understanding,lab appointment scheduled,lab orders entered.  Orders Placed This Encounter  Procedures   Basic metabolic panel    Standing Status:   Future    Standing Expiration Date:   03/25/2023    Order Specific Question:   Release to patient    Answer:   Immediate    Order Specific Question:   Release to patient    Answer:   Immediate [1]

## 2022-03-24 NOTE — Telephone Encounter (Signed)
-----   Message from Rafael Bihari, [place] sent at 03/20/2022  8:19 AM EDT ----- Kidney function mildly elevated.  Repeat BMET in 2 weeks please

## 2022-03-25 ENCOUNTER — Ambulatory Visit: Payer: Medicare HMO | Attending: Physician Assistant | Admitting: Physician Assistant

## 2022-03-25 ENCOUNTER — Encounter: Payer: Self-pay | Admitting: Physician Assistant

## 2022-03-25 VITALS — BP 120/80 | HR 76 | Ht 63.0 in | Wt 197.0 lb

## 2022-03-25 DIAGNOSIS — I951 Orthostatic hypotension: Secondary | ICD-10-CM | POA: Diagnosis not present

## 2022-03-25 DIAGNOSIS — I10 Essential (primary) hypertension: Secondary | ICD-10-CM | POA: Diagnosis not present

## 2022-03-25 DIAGNOSIS — I5022 Chronic systolic (congestive) heart failure: Secondary | ICD-10-CM | POA: Diagnosis not present

## 2022-03-25 DIAGNOSIS — I428 Other cardiomyopathies: Secondary | ICD-10-CM

## 2022-03-25 DIAGNOSIS — Z95810 Presence of automatic (implantable) cardiac defibrillator: Secondary | ICD-10-CM | POA: Diagnosis not present

## 2022-03-25 LAB — CUP PACEART INCLINIC DEVICE CHECK
Battery Remaining Longevity: 70 mo
Battery Voltage: 2.98 V
Brady Statistic AP VP Percent: 0.15 %
Brady Statistic AP VS Percent: 0.01 %
Brady Statistic AS VP Percent: 98.8 %
Brady Statistic AS VS Percent: 1.03 %
Brady Statistic RA Percent Paced: 0.16 %
Brady Statistic RV Percent Paced: 95.42 %
Date Time Interrogation Session: 20231101160623
HighPow Impedance: 70 Ohm
Implantable Lead Connection Status: 753985
Implantable Lead Connection Status: 753985
Implantable Lead Connection Status: 753985
Implantable Lead Implant Date: 20141210
Implantable Lead Implant Date: 20141210
Implantable Lead Implant Date: 20141210
Implantable Lead Location: 753858
Implantable Lead Location: 753859
Implantable Lead Location: 753860
Implantable Lead Model: 4396
Implantable Lead Model: 5076
Implantable Lead Model: 6935
Implantable Pulse Generator Implant Date: 20211118
Lead Channel Impedance Value: 361 Ohm
Lead Channel Impedance Value: 418 Ohm
Lead Channel Impedance Value: 475 Ohm
Lead Channel Impedance Value: 551 Ohm
Lead Channel Impedance Value: 646 Ohm
Lead Channel Impedance Value: 722 Ohm
Lead Channel Pacing Threshold Amplitude: 0.625 V
Lead Channel Pacing Threshold Amplitude: 1 V
Lead Channel Pacing Threshold Amplitude: 1.25 V
Lead Channel Pacing Threshold Pulse Width: 0.4 ms
Lead Channel Pacing Threshold Pulse Width: 0.4 ms
Lead Channel Pacing Threshold Pulse Width: 0.4 ms
Lead Channel Sensing Intrinsic Amplitude: 26.875 mV
Lead Channel Sensing Intrinsic Amplitude: 27 mV
Lead Channel Sensing Intrinsic Amplitude: 3 mV
Lead Channel Sensing Intrinsic Amplitude: 3.125 mV
Lead Channel Setting Pacing Amplitude: 1.5 V
Lead Channel Setting Pacing Amplitude: 2 V
Lead Channel Setting Pacing Amplitude: 2.5 V
Lead Channel Setting Pacing Pulse Width: 0.4 ms
Lead Channel Setting Pacing Pulse Width: 0.4 ms
Lead Channel Setting Sensing Sensitivity: 0.3 mV
Zone Setting Status: 755011
Zone Setting Status: 755011

## 2022-03-25 NOTE — Patient Instructions (Signed)
Medication Instructions:   Your physician recommends that you continue on your current medications as directed. Please refer to the Current Medication list given to you today.  *If you need a refill on your cardiac medications before your next appointment, please call your pharmacy*   Lab Work: [place]    If you have labs (blood work) drawn today and your tests are completely normal, you will receive your results only by: [place] (if you have MyChart) OR A paper copy in the mail If you have any lab test that is abnormal or we need to change your treatment, we will call you to review the results.   Testing/Procedures: NONE ORDERED  TODAY    Follow-Up: At [facility], you and your health needs are our priority.  As part of our continuing mission to provide you with exceptional heart care, we have created designated Provider Care Teams.  These Care Teams include your primary Cardiologist (physician) and Advanced Practice Providers (APPs -  Physician Assistants and Nurse Practitioners) who all work together to provide you with the care you need, when you need it.  We recommend signing up for the patient portal called "MyChart".  Sign up information is provided on this After Visit Summary.  MyChart is used to connect with patients for Virtual Visits (Telemedicine).  Patients are able to view lab/test results, encounter notes, upcoming appointments, etc.  Non-urgent messages can be sent to your provider as well.   To learn more about what you can do with MyChart, go to [email or web address].    Your next appointment:   1 year(s)  The format for your next appointment:   In Person  Provider:   You may see Dr.Taylor   Other Instructions    Important Information About Sugar

## 2022-03-28 ENCOUNTER — Other Ambulatory Visit (HOSPITAL_COMMUNITY): Payer: Self-pay | Admitting: Physician Assistant

## 2022-03-30 ENCOUNTER — Other Ambulatory Visit (HOSPITAL_COMMUNITY): Payer: Self-pay

## 2022-03-30 NOTE — Progress Notes (Signed)
Paramedicine Encounter    Patient ID: Rhonda [name], female    DOB: Dec 25, 1950, 71 y.o.   MRN: [id number]   Arrived for home visit for Rhonda [name] who reports to be feeling okay today. She denied chest pain, dizziness, shortness of breath or swelling. I obtained vitals and assessment. No lower leg swelling or edema. Lung sounds clear.   She was compliant with her medications over the last week.   Vitals- WT- 195lbs BP- 102/70 HR- 80 RR- 16 O2- 97%  Refills: Gabapentin Bupropion   I reviewed all meds and filled one pill box for one week.   I reviewed upcoming appointments and wrote down for same.   I plan to see Rhonda [name] in one week. Home visit complete. She knows to reach out if needed.   [name], [place] 03/30/2022      Patient Care Team: [name], MD as PCP - General (Family Medicine) [name], LCSW as Social Worker (Licensed Clinical Social Worker) [name], [name], DO as Consulting Physician (Neurology) [name], EMT as Paramedic [name], MD as Attending Physician (Family Medicine)  Patient Active Problem List   Diagnosis Date Noted   Elevated troponin 12/02/2021   Nausea & vomiting 12/01/2021   Acute left ankle pain 10/11/2021   Intractable nausea and vomiting 10/08/2021   AKI (acute kidney injury) ([place]) 10/08/2021   Hyponatremia 10/08/2021   Decreased estrogen level 08/01/2021   Dementia ([place]) 08/01/2021   Frail elderly 08/01/2021   Hyperparathyroidism ([place]) 08/01/2021   Spondylolisthesis 08/01/2021   Varicose veins of bilateral lower extremities with other complications 93/23/5573   Lumbago without sciatica 04/30/2021   Abnormal gait 06/11/2020   Adult failure to thrive syndrome 06/11/2020   Allergic rhinitis 06/11/2020   Anxiety 06/11/2020   Asthma 06/11/2020   Benign intracranial hypertension 06/11/2020   Body mass index (BMI) 45.0-49.9, adult ([place]) 06/11/2020   Bowel incontinence  06/11/2020   Cholelithiasis 06/11/2020   Chronic sinusitis 06/11/2020   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   Daytime somnolence 06/11/2020   Diarrhea of presumed infectious origin 06/11/2020   Edema 06/11/2020   Family history of malignant neoplasm of gastrointestinal tract 06/11/2020   Gout 06/11/2020   History of fall 06/11/2020   History of infectious disease 06/11/2020   Insomnia 06/11/2020   Irregular bowel habits 06/11/2020   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   Lumbar spondylosis with myelopathy 06/11/2020   Macrocytosis 06/11/2020   Mild recurrent major depression ([place]) 06/11/2020   Personal history of colonic polyps 06/11/2020   Personal history of malignant neoplasm of breast 06/11/2020   Repeated falls 06/11/2020   Mixed anxiety and depressive disorder 06/11/2020   Irritable bowel syndrome 01/04/2020   Spinal stenosis of lumbar region 01/03/2020   Other symptoms and signs involving the musculoskeletal system 09/11/2019   Bilateral leg weakness 08/01/2019   Leg swelling 08/01/2019   Diabetes mellitus with neuropathy ([place]) 06/15/2019   Hypokalemia 11/08/2018   CKD (chronic kidney disease) stage 3, GFR 30-59 ml/min (HCC) 11/08/2018   Orthostatic hypotension 07/28/2017   SVD (spontaneous vaginal delivery)    Peripheral neuropathy    On home oxygen therapy    Migraines    Left bundle branch block    Hypothyroidism    Hypertension    Hyperlipidemia    Heart murmur    GERD (gastroesophageal reflux disease)    Exertional shortness of breath    Major depressive disorder    Back pain    Arthritis    Generalized  anxiety disorder    Anemia    Chronic respiratory failure ([place]) 09/14/2013   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   Morbid obesity ([place]) 09/32/6712   Chronic systolic heart failure ([place]) 10/27/2012   Endometrial polyp 01/20/2012   Malignant tumor of breast ([place]) 03/26/2011   Unspecified vitamin D deficiency 03/26/2011    Current  Outpatient Medications:    acetaminophen (TYLENOL) 500 MG tablet, Take 1,000 mg by mouth daily as needed (for pain and fever of 101 or greater)., Disp: , Rfl:    acetaZOLAMIDE (DIAMOX) 125 MG tablet, Take 1 tablet (125 mg total) by mouth daily., Disp: 90 tablet, Rfl: 3   buPROPion (WELLBUTRIN XL) 150 MG 24 hr tablet, Take 150 mg by mouth daily., Disp: , Rfl:    carvedilol (COREG) 3.125 MG tablet, TAKE 1 TABLET (3.125 MG) BY MOUTH TWICE DAILY WITH MEALS, Disp: 180 tablet, Rfl: 3   citalopram (CELEXA) 20 MG tablet, Take 20 mg by mouth daily., Disp: , Rfl:    clonazePAM (KLONOPIN) 0.5 MG tablet, Take 0.5 mg by mouth 2 (two) times daily as needed for anxiety., Disp: , Rfl:    cyanocobalamin (VITAMIN B12) 1000 MCG tablet, Take 1,000 mcg by mouth daily., Disp: , Rfl:    dicyclomine (BENTYL) 20 MG tablet, Take 20 mg by mouth 3 (three) times daily before meals., Disp: , Rfl:    fluticasone (FLONASE) 50 MCG/ACT nasal spray, 1 spray in each nostril, Disp: , Rfl:    gabapentin (NEURONTIN) 300 MG capsule, Take 300 mg by mouth 2 (two) times daily., Disp: , Rfl:    levothyroxine (SYNTHROID) 88 MCG tablet, TAKE 1 TABLET(88 MCG) BY MOUTH DAILY BEFORE BREAKFAST, Disp: 90 tablet, Rfl: 3   metolazone (ZAROXOLYN) 2.5 MG tablet, Take 1 tablet by mouth every other Tuesday., Disp: 12 tablet, Rfl: 3   midodrine (PROAMATINE) 10 MG tablet, TAKE 1 TABLET THREE TIMES DAILY WITH MEALS, Disp: 270 tablet, Rfl: 3   pantoprazole (PROTONIX) 40 MG tablet, Take 40 mg by mouth daily as needed (for acid reflux)., Disp: , Rfl:    potassium chloride SA (KLOR-CON M) 20 MEQ tablet, TAKE 2 TABLETS THREE TIMES DAILY. TAKE AN EXTRA 2 TABLETS ON DAYS YOU TAKE METOLAZONE, Disp: 572 tablet, Rfl: 0   simvastatin (ZOCOR) 10 MG tablet, TAKE 1 TABLET EVERY EVENING, Disp: 90 tablet, Rfl: 1   spironolactone (ALDACTONE) 25 MG tablet, TAKE 1 TABLET (25 MG TOTAL) BY MOUTH EVERY EVENING., Disp: 90 tablet, Rfl: 0   torsemide (DEMADEX) 20 MG tablet, Take 4  tablets (80 mg total) by mouth every morning AND 3 tablets (60 mg total) every evening., Disp: 210 tablet, Rfl: 11 Allergies  Allergen Reactions   Ceftin Anaphylaxis    Face and throat swell    Geodon [Ziprasidone Hcl] Hives   Lisinopril Other (See Comments)    angioedema   Cefuroxime     Other reaction(s): anaphylaxis   Sulfacetamide Sodium-Sulfur     Other reaction(s): itch   Allopurinol Nausea Only and Other (See Comments)    weakness   Ativan [Lorazepam] Itching   Sulfa Antibiotics Itching   Valium [Diazepam] Other (See Comments)    Patient states that diazepam doesn't relax, it has the opposite effect.     Social History   Socioeconomic History   Marital status: Married    Spouse name: Not on file   Number of children: 2   Years of education: 18   Highest education level: Master's degree (e.g., MA, MS,  MEng, MEd, MSW, MBA)  Occupational History   Occupation: retired  Tobacco Use   Smoking status: Former    Packs/day: 0.10    Years: 26.00    Total pack years: 2.60    Types: Cigarettes    Quit date: 03/23/2021    Years since quitting: 1.0   Smokeless tobacco: Never  Vaping Use   Vaping Use: Never used  Substance and Sexual Activity   Alcohol use: No   Drug use: No   Sexual activity: Yes  Other Topics Concern   Not on file  Social History Narrative   Tobacco Use Cigarettes: Former Smoker, Quit in 2008   No Alcohol   No recreational drug use   Diet: Regular/Low Carb   Exercise: None   Occupation: disabled   Education: Research officer, political party, masters   Children: 2   Firearms: No   Therapist, art Use: 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      Social Determinants of Health   Financial Resource Strain: Not on file  Food Insecurity: Not on file  Transportation Needs: Not on file  Physical Activity: Not on file  Stress: Not on file  Social Connections: Not on file  Intimate Partner Violence: Not on file    Physical Exam      Future  Appointments  Date Time Provider [place]  04/01/2022  1:00 PM Hazle Coca, PhD [facility] None  04/01/2022  2:00 PM [facility] [place] [facility] None  04/08/2022  2:30 PM Hazle Coca, PhD [facility] None  04/08/2022  3:15 PM [facility] LAB [facility] None  04/13/2022  7:25 AM [facility] DEVICE REMOTES [facility]  04/15/2022  8:50 AM Narda Amber K, DO [facility] None  06/10/2022  7:15 AM [facility] DEVICE REMOTES [facility]  06/30/2022  2:00 PM Marzetta Board, DPM [facility]  09/09/2022  7:15 AM [facility] DEVICE REMOTES [facility]  12/09/2022  7:15 AM [facility] DEVICE REMOTES [facility]     ACTION: Home visit completed

## 2022-04-01 ENCOUNTER — Encounter: Payer: Medicare HMO | Admitting: Psychology

## 2022-04-06 ENCOUNTER — Other Ambulatory Visit (HOSPITAL_COMMUNITY): Payer: Self-pay

## 2022-04-06 NOTE — Progress Notes (Signed)
Paramedicine Encounter    Patient ID: Rhonda [name], female    DOB: 26-Dec-1950, 71 y.o.   MRN: [id number]   Met with [name] in the home today to review medications and to educate on HF management. She has been 100% compliant with her medications over the last week. No missed doses noted. She reports feeling good today with no reports of chest pain, shortness of breath, dizziness or swelling. Rhonda [name] and I reviewed meds and I filled pill box for one week.   Refills: Gabapentin Bupropion  Prevagen OTC   I reviewed upcoming appointments with her and wrote down reminders for same. If there are med changes following lab visit on Wednesday I will follow up in the home. Visit complete.   Rhonda [name], EMT-Paramedic [id number] 04/06/2022        Patient Care Team: Rhonda Smoker, MD as PCP - General (Family Medicine) Rhonda Ny, LCSW as Social Worker (Licensed Clinical Social Worker) Rhonda [name], Rhonda Serve, DO as Consulting Physician (Neurology) Rhonda [name], EMT as Paramedic Rhonda Smoker, MD as Attending Physician (Family Medicine)  Patient Active Problem List   Diagnosis Date Noted   Elevated troponin 12/02/2021   Nausea & vomiting 12/01/2021   Acute left ankle pain 10/11/2021   Intractable nausea and vomiting 10/08/2021   AKI (acute kidney injury) ([place]) 10/08/2021   Hyponatremia 10/08/2021   Decreased estrogen level 08/01/2021   Dementia ([place]) 08/01/2021   Frail elderly 08/01/2021   Hyperparathyroidism ([place]) 08/01/2021   Spondylolisthesis 08/01/2021   Varicose veins of bilateral lower extremities with other complications 63/05/6008   Lumbago without sciatica 04/30/2021   Abnormal gait 06/11/2020   Adult failure to thrive syndrome 06/11/2020   Allergic rhinitis 06/11/2020   Anxiety 06/11/2020   Asthma 06/11/2020   Benign intracranial hypertension 06/11/2020   Body mass index (BMI) 45.0-49.9, adult ([place]) 06/11/2020   Bowel incontinence 06/11/2020    Cholelithiasis 06/11/2020   Chronic sinusitis 06/11/2020   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   Daytime somnolence 06/11/2020   Diarrhea of presumed infectious origin 06/11/2020   Edema 06/11/2020   Family history of malignant neoplasm of gastrointestinal tract 06/11/2020   Gout 06/11/2020   History of fall 06/11/2020   History of infectious disease 06/11/2020   Insomnia 06/11/2020   Irregular bowel habits 06/11/2020   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   Lumbar spondylosis with myelopathy 06/11/2020   Macrocytosis 06/11/2020   Mild recurrent major depression ([place]) 06/11/2020   Personal history of colonic polyps 06/11/2020   Personal history of malignant neoplasm of breast 06/11/2020   Repeated falls 06/11/2020   Mixed anxiety and depressive disorder 06/11/2020   Irritable bowel syndrome 01/04/2020   Spinal stenosis of lumbar region 01/03/2020   Other symptoms and signs involving the musculoskeletal system 09/11/2019   Bilateral leg weakness 08/01/2019   Leg swelling 08/01/2019   Diabetes mellitus with neuropathy ([place]) 06/15/2019   Hypokalemia 11/08/2018   CKD (chronic kidney disease) stage 3, GFR 30-59 ml/min (HCC) 11/08/2018   Orthostatic hypotension 07/28/2017   SVD (spontaneous vaginal delivery)    Peripheral neuropathy    On home oxygen therapy    Migraines    Left bundle branch block    Hypothyroidism    Hypertension    Hyperlipidemia    Heart murmur    GERD (gastroesophageal reflux disease)    Exertional shortness of breath    Major depressive disorder    Back pain    Arthritis    Generalized anxiety disorder  Anemia    Chronic respiratory failure ([place]) 09/14/2013   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   Morbid obesity ([place]) 68/12/8108   Chronic systolic heart failure ([place]) 10/27/2012   Endometrial polyp 01/20/2012   Malignant tumor of breast ([place]) 03/26/2011   Unspecified vitamin D deficiency 03/26/2011    Current Outpatient  Medications:    acetaminophen (TYLENOL) 500 MG tablet, Take 1,000 mg by mouth daily as needed (for pain and fever of 101 or greater)., Disp: , Rfl:    acetaZOLAMIDE (DIAMOX) 125 MG tablet, Take 1 tablet (125 mg total) by mouth daily., Disp: 90 tablet, Rfl: 3   buPROPion (WELLBUTRIN XL) 150 MG 24 hr tablet, Take 150 mg by mouth daily., Disp: , Rfl:    carvedilol (COREG) 3.125 MG tablet, TAKE 1 TABLET (3.125 MG) BY MOUTH TWICE DAILY WITH MEALS, Disp: 180 tablet, Rfl: 3   citalopram (CELEXA) 20 MG tablet, Take 20 mg by mouth daily., Disp: , Rfl:    clonazePAM (KLONOPIN) 0.5 MG tablet, Take 0.5 mg by mouth 2 (two) times daily as needed for anxiety., Disp: , Rfl:    cyanocobalamin (VITAMIN B12) 1000 MCG tablet, Take 1,000 mcg by mouth daily., Disp: , Rfl:    dicyclomine (BENTYL) 20 MG tablet, Take 20 mg by mouth 3 (three) times daily before meals., Disp: , Rfl:    fluticasone (FLONASE) 50 MCG/ACT nasal spray, 1 spray in each nostril, Disp: , Rfl:    gabapentin (NEURONTIN) 300 MG capsule, Take 300 mg by mouth 2 (two) times daily., Disp: , Rfl:    levothyroxine (SYNTHROID) 88 MCG tablet, TAKE 1 TABLET(88 MCG) BY MOUTH DAILY BEFORE BREAKFAST, Disp: 90 tablet, Rfl: 3   metolazone (ZAROXOLYN) 2.5 MG tablet, Take 1 tablet by mouth every other Tuesday., Disp: 12 tablet, Rfl: 3   midodrine (PROAMATINE) 10 MG tablet, TAKE 1 TABLET THREE TIMES DAILY WITH MEALS, Disp: 270 tablet, Rfl: 3   pantoprazole (PROTONIX) 40 MG tablet, Take 40 mg by mouth daily as needed (for acid reflux)., Disp: , Rfl:    potassium chloride SA (KLOR-CON M) 20 MEQ tablet, TAKE 2 TABLETS THREE TIMES DAILY. TAKE AN EXTRA 2 TABLETS ON DAYS YOU TAKE METOLAZONE, Disp: 572 tablet, Rfl: 0   simvastatin (ZOCOR) 10 MG tablet, TAKE 1 TABLET EVERY EVENING, Disp: 90 tablet, Rfl: 1   spironolactone (ALDACTONE) 25 MG tablet, TAKE 1 TABLET (25 MG TOTAL) BY MOUTH EVERY EVENING., Disp: 90 tablet, Rfl: 0   torsemide (DEMADEX) 20 MG tablet, Take 4 tablets (80  mg total) by mouth every morning AND 3 tablets (60 mg total) every evening., Disp: 210 tablet, Rfl: 11 Allergies  Allergen Reactions   Ceftin Anaphylaxis    Face and throat swell    Geodon [Ziprasidone Hcl] Hives   Lisinopril Other (See Comments)    angioedema   Cefuroxime     Other reaction(s): anaphylaxis   Sulfacetamide Sodium-Sulfur     Other reaction(s): itch   Allopurinol Nausea Only and Other (See Comments)    weakness   Ativan [Lorazepam] Itching   Sulfa Antibiotics Itching   Valium [Diazepam] Other (See Comments)    Patient states that diazepam doesn't relax, it has the opposite effect.     Social History   Socioeconomic History   Marital status: Married    Spouse name: Not on file   Number of children: 2   Years of education: 18   Highest education level: Master's degree (e.g., MA, MS, MEng, MEd, MSW, MBA)  Occupational History   Occupation: retired  Tobacco Use   Smoking status: Former    Packs/day: 0.10    Years: 26.00    Total pack years: 2.60    Types: Cigarettes    Quit date: 03/23/2021    Years since quitting: 1.0   Smokeless tobacco: Never  Vaping Use   Vaping Use: Never used  Substance and Sexual Activity   Alcohol use: No   Drug use: No   Sexual activity: Yes  Other Topics Concern   Not on file  Social History Narrative   Tobacco Use Cigarettes: Former [name], Quit in 2008   No Alcohol   No recreational drug use   Diet: Regular/Low Carb   Exercise: None   Occupation: disabled   Education: Research officer, political party, masters   Children: 2   Firearms: No   Therapist, art Use: 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      Social Determinants of Health   Financial Resource Strain: Not on file  Food Insecurity: Not on file  Transportation Needs: Not on file  Physical Activity: Not on file  Stress: Not on file  Social Connections: Not on file  Intimate Partner Violence: Not on file    Physical Exam      Future  Appointments  Date Time Provider [place]  04/08/2022  3:15 PM [facility] LAB [facility] None  04/13/2022  7:25 AM [facility] DEVICE REMOTES [facility]  04/15/2022  8:50 AM Rhonda [name], Arvin Collard K, DO [facility] None  06/10/2022  7:15 AM [facility] DEVICE REMOTES [facility]  06/30/2022  2:00 PM Marzetta Board, DPM [facility]  07/07/2022  8:30 AM Hazle Coca, PhD [facility] None  07/07/2022  9:30 AM LBN- NEUROPSYCH TECH [facility] None  07/15/2022 10:00 AM Hazle Coca, PhD [facility] None  09/09/2022  7:15 AM [facility] DEVICE REMOTES [facility]  12/09/2022  7:15 AM [facility] DEVICE REMOTES [facility]     ACTION: Home visit completed

## 2022-04-08 ENCOUNTER — Ambulatory Visit (HOSPITAL_COMMUNITY)
Admission: RE | Admit: 2022-04-08 | Discharge: 2022-04-08 | Disposition: A | Discharge status: Home / Self Care | Payer: Medicare HMO | Source: Ambulatory Visit | Attending: Cardiology | Admitting: Cardiology

## 2022-04-08 ENCOUNTER — Encounter: Payer: Medicare HMO | Admitting: Psychology

## 2022-04-08 DIAGNOSIS — I5022 Chronic systolic (congestive) heart failure: Secondary | ICD-10-CM | POA: Diagnosis not present

## 2022-04-08 LAB — BASIC METABOLIC PANEL
Anion gap: 9 (ref 5–15)
BUN: 31 mg/dL — ABNORMAL HIGH (ref 8–23)
CO2: 23 mmol/L (ref 22–32)
Calcium: 9.4 mg/dL (ref 8.9–10.3)
Chloride: 106 mmol/L (ref 98–111)
Creatinine, Ser: 1.6 mg/dL — ABNORMAL HIGH (ref 0.44–1.00)
GFR, Estimated: 35 mL/min — ABNORMAL LOW (ref >60)
Glucose, Bld: 144 mg/dL — ABNORMAL HIGH (ref 70–99)
Potassium: 4.1 mmol/L (ref 3.5–5.1)
Sodium: 138 mmol/L (ref 135–145)

## 2022-04-11 ENCOUNTER — Other Ambulatory Visit (HOSPITAL_COMMUNITY): Payer: Self-pay | Admitting: Internal Medicine

## 2022-04-13 ENCOUNTER — Other Ambulatory Visit (HOSPITAL_COMMUNITY): Payer: Self-pay

## 2022-04-13 ENCOUNTER — Ambulatory Visit (INDEPENDENT_AMBULATORY_CARE_PROVIDER_SITE_OTHER): Payer: Medicare HMO

## 2022-04-13 DIAGNOSIS — Z95810 Presence of automatic (implantable) cardiac defibrillator: Secondary | ICD-10-CM | POA: Diagnosis not present

## 2022-04-13 DIAGNOSIS — N1832 Chronic kidney disease, stage 3b: Secondary | ICD-10-CM | POA: Diagnosis not present

## 2022-04-13 DIAGNOSIS — I130 Hypertensive heart and chronic kidney disease with heart failure and stage 1 through stage 4 chronic kidney disease, or unspecified chronic kidney disease: Secondary | ICD-10-CM | POA: Diagnosis not present

## 2022-04-13 DIAGNOSIS — I5022 Chronic systolic (congestive) heart failure: Secondary | ICD-10-CM

## 2022-04-13 DIAGNOSIS — E785 Hyperlipidemia, unspecified: Secondary | ICD-10-CM | POA: Diagnosis not present

## 2022-04-13 DIAGNOSIS — E039 Hypothyroidism, unspecified: Secondary | ICD-10-CM | POA: Diagnosis not present

## 2022-04-13 DIAGNOSIS — E1140 Type 2 diabetes mellitus with diabetic neuropathy, unspecified: Secondary | ICD-10-CM | POA: Diagnosis not present

## 2022-04-13 DIAGNOSIS — I10 Essential (primary) hypertension: Secondary | ICD-10-CM | POA: Diagnosis not present

## 2022-04-13 DIAGNOSIS — F331 Major depressive disorder, recurrent, moderate: Secondary | ICD-10-CM | POA: Diagnosis not present

## 2022-04-13 DIAGNOSIS — I5032 Chronic diastolic (congestive) heart failure: Secondary | ICD-10-CM | POA: Diagnosis not present

## 2022-04-13 DIAGNOSIS — K219 Gastro-esophageal reflux disease without esophagitis: Secondary | ICD-10-CM | POA: Diagnosis not present

## 2022-04-13 NOTE — Progress Notes (Signed)
Paramedicine Encounter    Patient ID: Rhonda [name], female    DOB: 1951/02/13, 71 y.o.   MRN: [id number]   Arrived for home visit for Rhonda [name] who reports to be having some right wrist, hand and foot pain. She feels it could be  similar to her last flare up she had when she went to the ER 11/01 and was worked up for Gout but Dr. Regenia Skeeter reported he felt it was not Gout. She was sent home with ace bandage and OTC therapies. She has taken Tylenol but it is not helping. Wrist and Knuckles in right hand appear swollen and tender to touch and ROM. She denied any recent injury. Lower right foot did not appear swollen but she reports some pain on ROM. I contacted PCP and they scheduled her for a follow up in their office tomorrow with Dr. [name] at [phone].   Sharman Cheek RN with the device clinic reached out in regards to Rhonda [name]'s transmission noting some fluid retention as well. Rhonda [name] is not weighing daily so it's hard to trend daily but her weight is down this week, she reports some mild shortness of breath on exertion which is not abnormal for her. She has missed no doses of her HF meds in the last week. She did admit to eating more fast and processed foods as well as an increase in her fluid intake. I discussed at length today with her and her husband the importance of maintaining a low sodium diet and to be very mindful of fluid ounces taken in every day and reminded them to be sure she is weighing every day and recording it on the provided weight charts she has posted in her room above her scale. Her and her husband both agreed. HF clinic advised no med changes with increase in fluid levels due to tomorrow being a metolazone day. Next transmission will be 11/27. I will follow up as well with device and HF clinics for further.   I obtained vitals and assessment.  WT- 192lbs BP- 110/68 HR- 85  regular O2- 98% RR- 16  Lungs- clear Edema- no pitting edema noted  I reviewed medications and  verified same filling pill box for one week. Refills as noted and called into [facility]: -Wellbutrin -Gabapentin -Celexa   -Note left for husband on where to place the ones missing for the next week.  Gabapentin: thurs, fri, sat, morn and eve  Wellbutrin: weds, thurs, fri, sat, sun, mon morn and eve   I reviewed and wrote down upcoming appointments on their calendars in the home.   I also spoke to Legrand Como Warehouse manager with [facility]/Chronic Care Management Team) to verify Rhonda [name] med list so he could update same and clarify any discrepancies.   Home visit complete. I will see Rhonda [name] in one week and follow up if needed in the meantime, she and her husband knows to reach out if needed. Home visit complete.   [name], [place] 04/13/2022   Patient Care Team: [name], MD as PCP - General (Family Medicine) [name], LCSW as Social Worker (Licensed Clinical Social Worker) [name], [name], DO as Consulting Physician (Neurology) [name], EMT-P as Paramedic [name] [name], MD as Attending Physician (Family Medicine)  Patient Active Problem List   Diagnosis Date Noted   Elevated troponin 12/02/2021   Nausea & vomiting 12/01/2021   Acute left ankle pain 10/11/2021   Intractable nausea and vomiting 10/08/2021   AKI (acute kidney  injury) ([place]) 10/08/2021   Hyponatremia 10/08/2021   Decreased estrogen level 08/01/2021   Dementia ([place]) 08/01/2021   Frail elderly 08/01/2021   Hyperparathyroidism ([place]) 08/01/2021   Spondylolisthesis 08/01/2021   Varicose veins of bilateral lower extremities with other complications 76/22/6333   Lumbago without sciatica 04/30/2021   Abnormal gait 06/11/2020   Adult failure to thrive syndrome 06/11/2020   Allergic rhinitis 06/11/2020   Anxiety 06/11/2020   Asthma 06/11/2020   Benign intracranial hypertension 06/11/2020   Body mass index (BMI) 45.0-49.9, adult ([place]) 06/11/2020   Bowel  incontinence 06/11/2020   Cholelithiasis 06/11/2020   Chronic sinusitis 06/11/2020   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   Daytime somnolence 06/11/2020   Diarrhea of presumed infectious origin 06/11/2020   Edema 06/11/2020   Family history of malignant neoplasm of gastrointestinal tract 06/11/2020   Gout 06/11/2020   History of fall 06/11/2020   History of infectious disease 06/11/2020   Insomnia 06/11/2020   Irregular bowel habits 06/11/2020   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   Lumbar spondylosis with myelopathy 06/11/2020   Macrocytosis 06/11/2020   Mild recurrent major depression ([place]) 06/11/2020   Personal history of colonic polyps 06/11/2020   Personal history of malignant neoplasm of breast 06/11/2020   Repeated falls 06/11/2020   Mixed anxiety and depressive disorder 06/11/2020   Irritable bowel syndrome 01/04/2020   Spinal stenosis of lumbar region 01/03/2020   Other symptoms and signs involving the musculoskeletal system 09/11/2019   Bilateral leg weakness 08/01/2019   Leg swelling 08/01/2019   Diabetes mellitus with neuropathy ([name]) 06/15/2019   Hypokalemia 11/08/2018   CKD (chronic kidney disease) stage 3, GFR 30-59 ml/min (HCC) 11/08/2018   Orthostatic hypotension 07/28/2017   SVD (spontaneous vaginal delivery)    Peripheral neuropathy    On home oxygen therapy    Migraines    Left bundle branch block    Hypothyroidism    Hypertension    Hyperlipidemia    Heart murmur    GERD (gastroesophageal reflux disease)    Exertional shortness of breath    Major depressive disorder    Back pain    Arthritis    Generalized anxiety disorder    Anemia    Chronic respiratory failure ([place]) 09/14/2013   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   Morbid obesity ([place]) 54/56/2563   Chronic systolic heart failure ([place]) 10/27/2012   Endometrial polyp 01/20/2012   Malignant tumor of breast ([place]) 03/26/2011   Unspecified vitamin D deficiency 03/26/2011     Current Outpatient Medications:    acetaminophen (TYLENOL) 500 MG tablet, Take 1,000 mg by mouth daily as needed (for pain and fever of 101 or greater)., Disp: , Rfl:    acetaZOLAMIDE (DIAMOX) 125 MG tablet, Take 1 tablet (125 mg total) by mouth daily., Disp: 90 tablet, Rfl: 3   Apoaequorin (PREVAGEN EXTRA STRENGTH) 20 MG CAPS, Take 20 mg by mouth daily., Disp: , Rfl:    buPROPion (WELLBUTRIN XL) 150 MG 24 hr tablet, Take 150 mg by mouth daily., Disp: , Rfl:    carvedilol (COREG) 3.125 MG tablet, TAKE 1 TABLET (3.125 MG) BY MOUTH TWICE DAILY WITH MEALS, Disp: 180 tablet, Rfl: 3   citalopram (CELEXA) 20 MG tablet, Take 20 mg by mouth daily., Disp: , Rfl:    clonazePAM (KLONOPIN) 0.5 MG tablet, Take 0.5 mg by mouth 2 (two) times daily as needed for anxiety., Disp: , Rfl:    cyanocobalamin (VITAMIN B12) 1000 MCG tablet, Take 1,000 mcg by mouth daily., Disp: , Rfl:  dicyclomine (BENTYL) 20 MG tablet, Take 20 mg by mouth 3 (three) times daily before meals., Disp: , Rfl:    fluticasone (FLONASE) 50 MCG/ACT nasal spray, 1 spray in each nostril, Disp: , Rfl:    gabapentin (NEURONTIN) 300 MG capsule, Take 300 mg by mouth 2 (two) times daily., Disp: , Rfl:    levothyroxine (SYNTHROID) 88 MCG tablet, TAKE 1 TABLET(88 MCG) BY MOUTH DAILY BEFORE BREAKFAST, Disp: 90 tablet, Rfl: 3   metolazone (ZAROXOLYN) 2.5 MG tablet, Take 1 tablet by mouth every other Tuesday., Disp: 12 tablet, Rfl: 3   midodrine (PROAMATINE) 10 MG tablet, TAKE 1 TABLET THREE TIMES DAILY WITH MEALS, Disp: 270 tablet, Rfl: 3   pantoprazole (PROTONIX) 40 MG tablet, Take 40 mg by mouth daily as needed (for acid reflux)., Disp: , Rfl:    potassium chloride SA (KLOR-CON M) 20 MEQ tablet, TAKE 2 TABLETS THREE TIMES DAILY. TAKE AN EXTRA 2 TABLETS ON DAYS YOU TAKE METOLAZONE, Disp: 572 tablet, Rfl: 0   simvastatin (ZOCOR) 10 MG tablet, TAKE 1 TABLET EVERY EVENING, Disp: 90 tablet, Rfl: 1   spironolactone (ALDACTONE) 25 MG tablet, TAKE 1  TABLET (25 MG TOTAL) BY MOUTH EVERY EVENING., Disp: 90 tablet, Rfl: 0   torsemide (DEMADEX) 20 MG tablet, Take 4 tablets (80 mg total) by mouth every morning AND 3 tablets (60 mg total) every evening., Disp: 210 tablet, Rfl: 11 Allergies  Allergen Reactions   Ceftin Anaphylaxis    Face and throat swell    Geodon [Ziprasidone Hcl] Hives   Lisinopril Other (See Comments)    angioedema   Cefuroxime     Other reaction(s): anaphylaxis   Sulfacetamide Sodium-Sulfur     Other reaction(s): itch   Allopurinol Nausea Only and Other (See Comments)    weakness   Ativan [Lorazepam] Itching   Sulfa Antibiotics Itching   Valium [Diazepam] Other (See Comments)    Patient states that diazepam doesn't relax, it has the opposite effect.     Social History   Socioeconomic History   Marital status: Married    Spouse name: Not on file   Number of children: 2   Years of education: 42   Highest education level: Master's degree (e.g., MA, MS, MEng, MEd, MSW, MBA)  Occupational History   Occupation: retired  Tobacco Use   Smoking status: Former    Packs/day: 0.10    Years: 26.00    Total pack years: 2.60    Types: Cigarettes    Quit date: 03/23/2021    Years since quitting: 1.0   Smokeless tobacco: Never  Vaping Use   Vaping Use: Never used  Substance and Sexual Activity   Alcohol use: No   Drug use: No   Sexual activity: Yes  Other Topics Concern   Not on file  Social History Narrative   Tobacco Use Cigarettes: Former Smoker, Quit in 2008   No Alcohol   No recreational drug use   Diet: Regular/Low Carb   Exercise: None   Occupation: disabled   Education: Research officer, political party, masters   Children: 2   Firearms: No   Therapist, art Use: 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      Social Determinants of Health   Financial Resource Strain: Not on file  Food Insecurity: Not on file  Transportation Needs: Not on file  Physical Activity: Not on file  Stress: Not on  file  Social Connections: Not on file  Intimate Partner Violence: Not on file    Physical Exam      Future Appointments  Date Time Provider [place]  04/15/2022  8:50 AM Narda Amber K, DO [facility] None  04/20/2022  7:25 AM [facility] DEVICE REMOTES [facility]  06/10/2022  7:15 AM [facility] DEVICE REMOTES [facility]  06/30/2022  2:00 PM Marzetta Board, DPM [facility]  07/07/2022  8:30 AM Hazle Coca, PhD [facility] None  07/07/2022  9:30 AM LBN- NEUROPSYCH TECH [facility] None  07/15/2022 10:00 AM Hazle Coca, PhD [facility] None  09/09/2022  7:15 AM [facility] DEVICE REMOTES [facility]  12/09/2022  7:15 AM [facility] DEVICE REMOTES [facility]     ACTION: Home visit completed

## 2022-04-13 NOTE — Progress Notes (Unsigned)
EPIC Encounter for ICM Monitoring  Patient Name: [name] is a 71 y.o. female Date: 04/13/2022 Primary Care Physican: [name], MD Primary Cardiologist: Bensimhon Electrophysiologist: Santina Evans Pacing: 99.1%   03/24/2022 Weight: 191 lbs 04/13/2022 Weight: 192 lbs          Spoke with patient and she is not experiencing any fluid symptoms.   She is due a home visit with [name], EMT with paramedicine program today.            Spoke with [name] with EMT program.  Pt has not missed any medication dosages.  She has discussed at length with patient regarding diet.  Pt has been eating a lot of restaurant foods and foods high in salt over the last month.         Also fluid intake is probably greater than 64 oz daily.  [name] will also discuss diet with patients husband.  Pt has slight SOB when walking from room to room and minimal edema in feet.     Optivol thoracic impedance suggesting fluid levels returned to normal after taking extra Metolazone and Potassium as recommended by [name], NP.   Difficulty maintaining normal fluid levels since    Prescribed:  Patient participating in paramedicine program to assist with meds. Torsemide 20 mg 4 tablets (80 mg total) by mouth every morning and 3 tablets (60 mg total) every evening.   Potassium 20 meq 2 tablets (40 mEq total) by mouth 3 (three) times daily.  Take extra 2 tablets on days you take Metolazone.   Metolazone 2.'5mg'$  1 tablet every other Tuesday.  Next dosage due 11/21. Spironolactone 25 mg take 1 tablet by mouth every evening.   Labs: 04/08/2022 Creatinine 1.60, BUN 31, Potassium 4.1, Sodium 138, GFR 35 03/18/2022 Creatinine 2.12, BUN 43, Potassium 4.4, Sodium 134, GFR 25  02/22/2022 Creatinine 1.67, BUN 39, Potassium 3.9, Sodium 134, GFR 33 02/04/2022 Creatinine 1.71, BUN 18, Potassium 4.4, Sodium 136, GFR 32  A complete set of results can be found in Results Review.   Recommendations:   Next  Metolazone dosage due tomorrow, 11/21.  Copy sent to [name], NP at [facility] for review and recommendations if needed.     Follow-up plan: ICM clinic phone appointment on 04/20/2022 to recheck fluid levels.   91 day device clinic remote transmission 06/10/2022.     EP/Cardiology Office Visits:   Recall 07/06/2022 with Dr Haroldine Laws.  Recall 03/20/2023 with Dr Lovena Le.     Copy of ICM check sent to Dr. Lovena Le.  3 month ICM trend: 04/13/2022.    12-14 Month ICM trend:     [name], RN 04/13/2022 1:34 PM

## 2022-04-14 NOTE — Progress Notes (Signed)
Spoke with patient and advised to take Metolazone as prescribed today and she confirmed she has taken her morning meds.  Pill box filled 11/20 by [name], [name].  Advised no changes at this time and reminded to limit salt and  fluid intake.

## 2022-04-14 NOTE — Progress Notes (Signed)
  Received: Alvino Chapel, Maricela Bo, FNP  Angell Pincock Panda, RN Thanks [name], no change for now. We'll see how she does with the metolazone dose tomorrow.

## 2022-04-15 ENCOUNTER — Telehealth: Payer: Medicare HMO | Admitting: Neurology

## 2022-04-15 ENCOUNTER — Telehealth: Payer: Self-pay

## 2022-04-15 NOTE — Telephone Encounter (Signed)
Call to pt about restarting PREP. Has finished PT at home. Open to starting on 04/21/22 at 230p-345p T/TH. Will complete intake after start of class. Will meet pt in lobby before class.

## 2022-04-20 ENCOUNTER — Ambulatory Visit (INDEPENDENT_AMBULATORY_CARE_PROVIDER_SITE_OTHER): Payer: Medicare HMO

## 2022-04-20 ENCOUNTER — Other Ambulatory Visit (HOSPITAL_COMMUNITY): Payer: Self-pay

## 2022-04-20 DIAGNOSIS — I5022 Chronic systolic (congestive) heart failure: Secondary | ICD-10-CM

## 2022-04-20 DIAGNOSIS — Z95810 Presence of automatic (implantable) cardiac defibrillator: Secondary | ICD-10-CM

## 2022-04-20 NOTE — Progress Notes (Signed)
Paramedicine Encounter    Patient ID: Rhonda [name], female    DOB: [birth year], 71 y.o.   MRN: [id number]  Arrived for home visit for Rhonda [name] who reports to be feeling unwell today. She reports she is having foot and wrist pain on going from last week with some relief but not much. She missed her appointments last week with PCP and her husband had to reschedule. She will be seen by them this Wednesday at 330. Yaritsa has been taking Vitamin C and drinking pure cherry juice to help with gout related pain. She says it is helping some but not completely. She said she is trying to improve on fluid and salt intake. Husband confirmed same.   I obtained vitals as noted:  WT- 190lbs BP- 110/62 HR- 80 RR- 16 O2- 97%  Lungs- clear Lower leg swelling- none   I reviewed meds and confirmed same filling pill box for one week.   Refills called into [facility]- torsemide   I reviewed upcoming appointments and wrote down and confirmed same.   She will be restarting PREP at [facility] tomorrow.   I left VM with Sharman Cheek, RN at device clinic to make her aware of her vitals and assessment findings for today.   I will plan to see Charika in one week. Home visit complete.  [name], EMT-Paramedic [phone] 04/20/2022    Patient Care Team: [name], MD as PCP - General (Family Medicine) [name], LCSW as Social Worker (Licensed Clinical Social Worker) [name], [name], DO as Consulting Physician (Neurology) [name], EMT-P as Paramedic [name] [name], MD as Attending Physician (Family Medicine)  Patient Active Problem List   Diagnosis Date Noted   Elevated troponin 12/02/2021   Nausea & vomiting 12/01/2021   Acute left ankle pain 10/11/2021   Intractable nausea and vomiting 10/08/2021   AKI (acute kidney injury) ([place]) 10/08/2021   Hyponatremia 10/08/2021   Decreased estrogen level 08/01/2021   Dementia ([place]) 08/01/2021   Frail elderly 08/01/2021    Hyperparathyroidism ([place]) 08/01/2021   Spondylolisthesis 08/01/2021   Varicose veins of bilateral lower extremities with other complications 65/68/1275   Lumbago without sciatica 04/30/2021   Abnormal gait 06/11/2020   Adult failure to thrive syndrome 06/11/2020   Allergic rhinitis 06/11/2020   Anxiety 06/11/2020   Asthma 06/11/2020   Benign intracranial hypertension 06/11/2020   Body mass index (BMI) 45.0-49.9, adult ([place]) 06/11/2020   Bowel incontinence 06/11/2020   Cholelithiasis 06/11/2020   Chronic sinusitis 06/11/2020   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   Daytime somnolence 06/11/2020   Diarrhea of presumed infectious origin 06/11/2020   Edema 06/11/2020   Family history of malignant neoplasm of gastrointestinal tract 06/11/2020   Gout 06/11/2020   History of fall 06/11/2020   History of infectious disease 06/11/2020   Insomnia 06/11/2020   Irregular bowel habits 06/11/2020   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   Lumbar spondylosis with myelopathy 06/11/2020   Macrocytosis 06/11/2020   Mild recurrent major depression ([place]) 06/11/2020   Personal history of colonic polyps 06/11/2020   Personal history of malignant neoplasm of breast 06/11/2020   Repeated falls 06/11/2020   Mixed anxiety and depressive disorder 06/11/2020   Irritable bowel syndrome 01/04/2020   Spinal stenosis of lumbar region 01/03/2020   Other symptoms and signs involving the musculoskeletal system 09/11/2019   Bilateral leg weakness 08/01/2019   Leg swelling 08/01/2019   Diabetes mellitus with neuropathy ([place]) 06/15/2019   Hypokalemia 11/08/2018   CKD (chronic kidney disease) stage 3,  GFR 30-59 ml/min (HCC) 11/08/2018   Orthostatic hypotension 07/28/2017   SVD (spontaneous vaginal delivery)    Peripheral neuropathy    On home oxygen therapy    Migraines    Left bundle branch block    Hypothyroidism    Hypertension    Hyperlipidemia    Heart murmur    GERD (gastroesophageal reflux disease)    Exertional  shortness of breath    Major depressive disorder    Back pain    Arthritis    Generalized anxiety disorder    Anemia    Chronic respiratory failure ([place]) 09/14/2013   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   Morbid obesity ([place]) 09/38/1829   Chronic systolic heart failure ([place]) 10/27/2012   Endometrial polyp 01/20/2012   Malignant tumor of breast ([place]) 03/26/2011   Unspecified vitamin D deficiency 03/26/2011    Current Outpatient Medications:    acetaminophen (TYLENOL) 500 MG tablet, Take 1,000 mg by mouth daily as needed (for pain and fever of 101 or greater)., Disp: , Rfl:    acetaZOLAMIDE (DIAMOX) 125 MG tablet, Take 1 tablet (125 mg total) by mouth daily., Disp: 90 tablet, Rfl: 3   Apoaequorin (PREVAGEN EXTRA STRENGTH) 20 MG CAPS, Take 20 mg by mouth daily., Disp: , Rfl:    buPROPion (WELLBUTRIN XL) 150 MG 24 hr tablet, Take 150 mg by mouth daily., Disp: , Rfl:    carvedilol (COREG) 3.125 MG tablet, TAKE 1 TABLET (3.125 MG) BY MOUTH TWICE DAILY WITH MEALS, Disp: 180 tablet, Rfl: 3   citalopram (CELEXA) 20 MG tablet, Take 20 mg by mouth daily., Disp: , Rfl:    clonazePAM (KLONOPIN) 0.5 MG tablet, Take 0.5 mg by mouth 2 (two) times daily as needed for anxiety., Disp: , Rfl:    cyanocobalamin (VITAMIN B12) 1000 MCG tablet, Take 1,000 mcg by mouth daily., Disp: , Rfl:    dicyclomine (BENTYL) 20 MG tablet, Take 20 mg by mouth 3 (three) times daily before meals., Disp: , Rfl:    famotidine-calcium carbonate-magnesium hydroxide (PEPCID COMPLETE) 10-800-165 MG chewable tablet, Chew 1 tablet by mouth daily as needed., Disp: , Rfl:    fluticasone (FLONASE) 50 MCG/ACT nasal spray, 1 spray in each nostril, Disp: , Rfl:    gabapentin (NEURONTIN) 300 MG capsule, Take 300 mg by mouth 2 (two) times daily., Disp: , Rfl:    levothyroxine (SYNTHROID) 88 MCG tablet, TAKE 1 TABLET(88 MCG) BY MOUTH DAILY BEFORE BREAKFAST, Disp: 90 tablet, Rfl: 3   metolazone  (ZAROXOLYN) 2.5 MG tablet, Take 1 tablet by mouth every other Tuesday., Disp: 12 tablet, Rfl: 3   midodrine (PROAMATINE) 10 MG tablet, TAKE 1 TABLET THREE TIMES DAILY WITH MEALS, Disp: 270 tablet, Rfl: 3   Multiple Vitamin (MULTIVITAMIN WITH MINERALS) TABS tablet, Take 1 tablet by mouth daily., Disp: , Rfl:    pantoprazole (PROTONIX) 40 MG tablet, Take 40 mg by mouth daily as needed (for acid reflux). (Patient not taking: Reported on 04/13/2022), Disp: , Rfl:    potassium chloride SA (KLOR-CON M) 20 MEQ tablet, TAKE 2 TABLETS THREE TIMES DAILY. TAKE AN EXTRA 2 TABLETS ON DAYS YOU TAKE METOLAZONE, Disp: 572 tablet, Rfl: 0   simvastatin (ZOCOR) 10 MG tablet, TAKE 1 TABLET EVERY EVENING, Disp: 90 tablet, Rfl: 1   spironolactone (ALDACTONE) 25 MG tablet, TAKE 1 TABLET (25 MG TOTAL) BY MOUTH EVERY EVENING., Disp: 90 tablet, Rfl: 0   torsemide (DEMADEX) 20 MG tablet, Take 4 tablets (80 mg total) by mouth every  morning AND 3 tablets (60 mg total) every evening., Disp: 210 tablet, Rfl: 11 Allergies  Allergen Reactions   Ceftin Anaphylaxis    Face and throat swell    Geodon [Ziprasidone Hcl] Hives   Lisinopril Other (See Comments)    angioedema   Cefuroxime     Other reaction(s): anaphylaxis   Sulfacetamide Sodium-Sulfur     Other reaction(s): itch   Allopurinol Nausea Only and Other (See Comments)    weakness   Ativan [Lorazepam] Itching   Sulfa Antibiotics Itching   Valium [Diazepam] Other (See Comments)    Patient states that diazepam doesn't relax, it has the opposite effect.     Social History   Socioeconomic History   Marital status: Married    Spouse name: Not on file   Number of children: 2   Years of education: 23   Highest education level: Master's degree (e.g., MA, MS, MEng, MEd, MSW, MBA)  Occupational History   Occupation: retired  Tobacco Use   Smoking status: Former    Packs/day: 0.10    Years: 26.00    Total pack years: 2.60    Types: Cigarettes    Quit date:  03/23/2021    Years since quitting: 1.0   Smokeless tobacco: Never  Vaping Use   Vaping Use: Never used  Substance and Sexual Activity   Alcohol use: No   Drug use: No   Sexual activity: Yes  Other Topics Concern   Not on file  Social History Narrative   Tobacco Use Cigarettes: Former Smoker, Quit in 2008   No Alcohol   No recreational drug use   Diet: Regular/Low Carb   Exercise: None   Occupation: disabled   Education: Research officer, political party, masters   Children: 2   Firearms: No   Therapist, art Use: 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      Social Determinants of Health   Financial Resource Strain: Not on file  Food Insecurity: Not on file  Transportation Needs: Not on file  Physical Activity: Not on file  Stress: Not on file  Social Connections: Not on file  Intimate Partner Violence: Not on file    Physical Exam      Future Appointments  Date Time Provider [place]  06/10/2022  7:15 AM [facility] DEVICE REMOTES [facility]  06/15/2022  3:30 PM Narda Amber K, DO [facility] None  06/30/2022  2:00 PM Marzetta Board, DPM [facility]  07/07/2022  8:30 AM Hazle Coca, PhD [facility] None  07/07/2022  9:30 AM [facility]- NEUROPSYCH TECH [facility] None  07/15/2022 10:00 AM Hazle Coca, PhD [facility] None  09/09/2022  7:15 AM [facility] DEVICE REMOTES [facility]  12/09/2022  7:15 AM [facility] DEVICE REMOTES [facility]     ACTION: Home visit completed

## 2022-04-22 DIAGNOSIS — M109 Gout, unspecified: Secondary | ICD-10-CM | POA: Diagnosis not present

## 2022-04-22 DIAGNOSIS — R519 Headache, unspecified: Secondary | ICD-10-CM | POA: Diagnosis not present

## 2022-04-22 DIAGNOSIS — M25531 Pain in right wrist: Secondary | ICD-10-CM | POA: Diagnosis not present

## 2022-04-22 DIAGNOSIS — R0981 Nasal congestion: Secondary | ICD-10-CM | POA: Diagnosis not present

## 2022-04-22 DIAGNOSIS — M2540 Effusion, unspecified joint: Secondary | ICD-10-CM | POA: Diagnosis not present

## 2022-04-22 NOTE — Progress Notes (Signed)
EPIC Encounter for ICM Monitoring  Patient Name: Rhonda [name] is a 71 y.o. female Date: 04/22/2022 Primary Care Physican: [name], MD Primary Cardiologist: [place] Electrophysiologist: Santina Evans Pacing: 98.9%   03/24/2022 Weight: 191 lbs 04/13/2022 Weight: 192 lbs 11/27/023 Weight: 190 lbs          Spoke with husband (pt in shower) per DPR and heart failure questions reviewed.  Transmission results reviewed.              Received voice mail message on 11/28, from [facility] program, stating patient's weight is 190 lbs, no SOB and no edema.    [facility]ivol thoracic impedance suggesting fluid level returned to normal after taking prescribed Metolazone dosage on 11/21.       Prescribed:  Patient participating in paramedicine program to assist with meds. Torsemide 20 mg 4 tablets (80 mg total) by mouth every morning and 3 tablets (60 mg total) every evening.   Potassium 20 meq 2 tablets (40 mEq total) by mouth 3 (three) times daily.  Take extra 2 tablets on days you take Metolazone.   Metolazone 2.'5mg'$  1 tablet every other Tuesday.   Spironolactone 25 mg take 1 tablet by mouth every evening.   Labs: 04/08/2022 Creatinine 1.60, BUN 31, Potassium 4.1, Sodium 138, GFR 35 03/18/2022 Creatinine 2.12, BUN 43, Potassium 4.4, Sodium 134, GFR 25  02/22/2022 Creatinine 1.67, BUN 39, Potassium 3.9, Sodium 134, GFR 33 02/04/2022 Creatinine 1.71, BUN 18, Potassium 4.4, Sodium 136, GFR 32  A complete set of results can be found in Results Review.   Recommendations:   No changes and encouraged to call if experiencing any fluid symptoms.   Follow-up plan: ICM clinic phone appointment on 06/01/2022.   91 day device clinic remote transmission 06/10/2022.     EP/Cardiology Office Visits:   Recall 07/06/2022 with Dr Haroldine Laws.  Recall 03/20/2023 with Dr Lovena Le.     Copy of ICM check sent to Dr. Lovena Le.  3 month ICM trend: 04/20/2022.    12-14 Month ICM trend:      [name], RN 04/22/2022 5:57 AM

## 2022-04-23 ENCOUNTER — Telehealth (HOSPITAL_COMMUNITY): Payer: Self-pay

## 2022-04-23 NOTE — Telephone Encounter (Signed)
Ed, Mrs. Valenta's husband called me to inform me she was seen by PCP for suspected Gout and blood work came back + for same. She was given Prednisone and started it yesterday. I will plan to follow up next week on Monday in the home. Call complete.   [name], [place] 04/23/2022

## 2022-04-27 ENCOUNTER — Other Ambulatory Visit (HOSPITAL_COMMUNITY): Payer: Self-pay

## 2022-04-27 NOTE — Progress Notes (Signed)
Paramedicine Encounter    Patient ID: Rhonda [name], female    DOB: 14-Aug-1950, 71 y.o.   MRN: [id number]   Arrived for home visit for Rhonda [name] who reports to be feeling good overall. She reports she feels better than last week,she has been on Prednisone for a gout flare up. Weight is up- possibly due to same. Metolazone day tomorrow. No lower leg edema noted. She reports that she has been compliant with meds. Pill box reflects same. I filled pill box for one week. NO refills. We reviewed HF management- diet and exercise. She is now enrolled in the PREP program and will be going every Tuesday & Thursday. I reminded her the low sodium and fluid restrictions. She verbalized understanding. Lungs clear on assessment. Vitals as noted:  WT- 195lbs BP- 150/68 HR- 70 O2- 98% RR-16    Home visit complete. I will see Rhonda [name] in one week.     Patient Care Team: [name], MD as PCP - General (Family Medicine) [name], LCSW as Social Worker (Licensed Clinical Social Worker) [name], [name], DO as Consulting Physician (Neurology) [name], EMT-P as Paramedic [name] [name], MD as Attending Physician (Family Medicine)  Patient Active Problem List   Diagnosis Date Noted   Elevated troponin 12/02/2021   Nausea & vomiting 12/01/2021   Acute left ankle pain 10/11/2021   Intractable nausea and vomiting 10/08/2021   AKI (acute kidney injury) ([place]) 10/08/2021   Hyponatremia 10/08/2021   Decreased estrogen level 08/01/2021   Dementia ([place]) 08/01/2021   Frail elderly 08/01/2021   Hyperparathyroidism ([place]) 08/01/2021   Spondylolisthesis 08/01/2021   Varicose veins of bilateral lower extremities with other complications 87/56/4332   Lumbago without sciatica 04/30/2021   Abnormal gait 06/11/2020   Adult failure to thrive syndrome 06/11/2020   Allergic rhinitis 06/11/2020   Anxiety 06/11/2020   Asthma 06/11/2020   Benign intracranial hypertension 06/11/2020   Body  mass index (BMI) 45.0-49.9, adult ([place]) 06/11/2020   Bowel incontinence 06/11/2020   Cholelithiasis 06/11/2020   Chronic sinusitis 06/11/2020   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   Daytime somnolence 06/11/2020   Diarrhea of presumed infectious origin 06/11/2020   Edema 06/11/2020   Family history of malignant neoplasm of gastrointestinal tract 06/11/2020   Gout 06/11/2020   History of fall 06/11/2020   History of infectious disease 06/11/2020   Insomnia 06/11/2020   Irregular bowel habits 06/11/2020   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   Lumbar spondylosis with myelopathy 06/11/2020   Macrocytosis 06/11/2020   Mild recurrent major depression ([place]) 06/11/2020   Personal history of colonic polyps 06/11/2020   Personal history of malignant neoplasm of breast 06/11/2020   Repeated falls 06/11/2020   Mixed anxiety and depressive disorder 06/11/2020   Irritable bowel syndrome 01/04/2020   Spinal stenosis of lumbar region 01/03/2020   Other symptoms and signs involving the musculoskeletal system 09/11/2019   Bilateral leg weakness 08/01/2019   Leg swelling 08/01/2019   Diabetes mellitus with neuropathy ([place]) 06/15/2019   Hypokalemia 11/08/2018   CKD (chronic kidney disease) stage 3, GFR 30-59 ml/min (HCC) 11/08/2018   Orthostatic hypotension 07/28/2017   SVD (spontaneous vaginal delivery)    Peripheral neuropathy    On home oxygen therapy    Migraines    Left bundle branch block    Hypothyroidism    Hypertension    Hyperlipidemia    Heart murmur    GERD (gastroesophageal reflux disease)    Exertional shortness of breath    Major depressive disorder  Back pain    Arthritis    Generalized anxiety disorder    Anemia    Chronic respiratory failure ([place]) 09/14/2013   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   Morbid obesity ([place]) 85/46/2703   Chronic systolic heart failure ([place]) 10/27/2012   Endometrial polyp 01/20/2012   Malignant tumor of breast  ([place]) 03/26/2011   Unspecified vitamin D deficiency 03/26/2011    Current Outpatient Medications:    acetaminophen (TYLENOL) 500 MG tablet, Take 1,000 mg by mouth daily as needed (for pain and fever of 101 or greater)., Disp: , Rfl:    acetaZOLAMIDE (DIAMOX) 125 MG tablet, Take 1 tablet (125 mg total) by mouth daily., Disp: 90 tablet, Rfl: 3   Apoaequorin (PREVAGEN EXTRA STRENGTH) 20 MG CAPS, Take 20 mg by mouth daily., Disp: , Rfl:    Ascorbic Acid (VITAMIN C) 1000 MG tablet, Take 1,000 mg by mouth daily., Disp: , Rfl:    buPROPion (WELLBUTRIN XL) 150 MG 24 hr tablet, Take 150 mg by mouth daily., Disp: , Rfl:    carvedilol (COREG) 3.125 MG tablet, TAKE 1 TABLET (3.125 MG) BY MOUTH TWICE DAILY WITH MEALS, Disp: 180 tablet, Rfl: 3   citalopram (CELEXA) 20 MG tablet, Take 20 mg by mouth daily., Disp: , Rfl:    clonazePAM (KLONOPIN) 0.5 MG tablet, Take 0.5 mg by mouth 2 (two) times daily as needed for anxiety., Disp: , Rfl:    cyanocobalamin (VITAMIN B12) 1000 MCG tablet, Take 1,000 mcg by mouth daily., Disp: , Rfl:    dicyclomine (BENTYL) 20 MG tablet, Take 20 mg by mouth 3 (three) times daily before meals., Disp: , Rfl:    famotidine-calcium carbonate-magnesium hydroxide (PEPCID COMPLETE) 10-800-165 MG chewable tablet, Chew 1 tablet by mouth daily as needed., Disp: , Rfl:    fluticasone (FLONASE) 50 MCG/ACT nasal spray, 1 spray in each nostril, Disp: , Rfl:    gabapentin (NEURONTIN) 300 MG capsule, Take 300 mg by mouth 2 (two) times daily., Disp: , Rfl:    levothyroxine (SYNTHROID) 88 MCG tablet, TAKE 1 TABLET(88 MCG) BY MOUTH DAILY BEFORE BREAKFAST, Disp: 90 tablet, Rfl: 3   metolazone (ZAROXOLYN) 2.5 MG tablet, Take 1 tablet by mouth every other Tuesday., Disp: 12 tablet, Rfl: 3   midodrine (PROAMATINE) 10 MG tablet, TAKE 1 TABLET THREE TIMES DAILY WITH MEALS, Disp: 270 tablet, Rfl: 3   Multiple Vitamin (MULTIVITAMIN WITH MINERALS) TABS tablet, Take 1 tablet by mouth daily., Disp: , Rfl:     pantoprazole (PROTONIX) 40 MG tablet, Take 40 mg by mouth daily as needed (for acid reflux). (Patient not taking: Reported on 04/13/2022), Disp: , Rfl:    potassium chloride SA (KLOR-CON M) 20 MEQ tablet, TAKE 2 TABLETS THREE TIMES DAILY. TAKE AN EXTRA 2 TABLETS ON DAYS YOU TAKE METOLAZONE, Disp: 572 tablet, Rfl: 0   simvastatin (ZOCOR) 10 MG tablet, TAKE 1 TABLET EVERY EVENING, Disp: 90 tablet, Rfl: 1   spironolactone (ALDACTONE) 25 MG tablet, TAKE 1 TABLET (25 MG TOTAL) BY MOUTH EVERY EVENING., Disp: 90 tablet, Rfl: 0   torsemide (DEMADEX) 20 MG tablet, Take 4 tablets (80 mg total) by mouth every morning AND 3 tablets (60 mg total) every evening., Disp: 210 tablet, Rfl: 11 Allergies  Allergen Reactions   Ceftin Anaphylaxis    Face and throat swell    Geodon [Ziprasidone Hcl] Hives   Lisinopril Other (See Comments)    angioedema   Cefuroxime     Other reaction(s): anaphylaxis   Sulfacetamide Sodium-Sulfur  Other reaction(s): itch   Allopurinol Nausea Only and Other (See Comments)    weakness   Ativan [Lorazepam] Itching   Sulfa Antibiotics Itching   Valium [Diazepam] Other (See Comments)    Patient states that diazepam doesn't relax, it has the opposite effect.     Social History   Socioeconomic History   Marital status: Married    Spouse name: Not on file   Number of children: 2   Years of education: 66   Highest education level: Master's degree (e.g., MA, MS, MEng, MEd, MSW, MBA)  Occupational History   Occupation: retired  Tobacco Use   Smoking status: Former    Packs/day: 0.10    Years: 26.00    Total pack years: 2.60    Types: Cigarettes    Quit date: 03/23/2021    Years since quitting: 1.0   Smokeless tobacco: Never  Vaping Use   Vaping Use: Never used  Substance and Sexual Activity   Alcohol use: No   Drug use: No   Sexual activity: Yes  Other Topics Concern   Not on file  Social History Narrative   Tobacco Use Cigarettes: Former Smoker, Quit in 2008    No Alcohol   No recreational drug use   Diet: Regular/Low Carb   Exercise: None   Occupation: disabled   Education: Research officer, political party, masters   Children: 2   Firearms: No   Therapist, art Use: 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      Social Determinants of Health   Financial Resource Strain: Not on file  Food Insecurity: Not on file  Transportation Needs: Not on file  Physical Activity: Not on file  Stress: Not on file  Social Connections: Not on file  Intimate Partner Violence: Not on file    Physical Exam      Future Appointments  Date Time Provider [place]  06/01/2022  7:20 AM [facility] DEVICE REMOTES [facility]  06/10/2022  7:15 AM [facility] DEVICE REMOTES [facility]  06/15/2022  3:30 PM Narda Amber K, DO [facility] None  06/30/2022  2:00 PM Marzetta Board, DPM [facility]  07/07/2022  8:30 AM Hazle Coca, PhD [facility] None  07/07/2022  9:30 AM L[facility]- NEUROPSYCH TECH [facility] None  07/15/2022 10:00 AM Hazle Coca, PhD [facility] None  09/09/2022  7:15 AM [facility] DEVICE REMOTES [facility]  12/09/2022  7:15 AM [facility] DEVICE REMOTES [facility]     ACTION: Home visit completed

## 2022-05-04 ENCOUNTER — Other Ambulatory Visit (HOSPITAL_COMMUNITY): Payer: Self-pay

## 2022-05-04 NOTE — Progress Notes (Signed)
[facility] PREP Weekly Session  Patient Details  Name: Rhonda [name] MRN: [id number] Date of Birth: 03/08/1951 Age: 71 y.o. PCP: [name], MD  There were no vitals filed for this visit.   [facility] Weekly seesion - 05/04/22 0900       [facility] "PREP" Location   [facility] "PREP" Location [facility]      Weekly Session   Topic Discussed Importance of resistance training;Other ways to be active    Classes attended to date [place] 05/04/2022, 9:39 AM

## 2022-05-04 NOTE — Progress Notes (Signed)
Paramedicine Encounter    Patient ID: Rhonda [name], female    DOB: November 09, 1950, 71 y.o.   MRN: [id number]   Arrived for home visit for Mrs. Safran who reports to be feeling okay physcially but having some depression more so this week. When talking to her she states she just wishes she could go out and do things. I expressed to her there are people and organizations we could get her some resources for this type of interaction and she verbalizes,"I will be okay". I made her aware that I am here when and if she needs to talk to access those resources.   Vitals were obtained and as noted. No lower leg swelling today. Weight down this week 8lbs. She denied shortness of breath, dizziness or chest pain. She reports she gets acid reflux sometimes but uses Pepcid for same and it helps.   She is enrolled in PREP and going Tuesday/Thursday. She enjoys this.   I reviewed meds. She was compliant for the last week 100%. I filled pill box for one week. No refills needed.   We discussed diet and med compliance as well as the need to remain in her exercise program and limit her fluids. She agreed and plans to do so.   Home visit complete. I will see Ciara in one week.   [name], EMT-Paramedic [phone] 05/04/2022   Patient Care Team: [name], MD as PCP - General (Family Medicine) [name], LCSW as Social Worker (Licensed Clinical Social Worker) [name], [name], DO as Consulting Physician (Neurology) [name], EMT-P as Paramedic [name] [name], MD as Attending Physician (Family Medicine)  Patient Active Problem List   Diagnosis Date Noted   Elevated troponin 12/02/2021   Nausea & vomiting 12/01/2021   Acute left ankle pain 10/11/2021   Intractable nausea and vomiting 10/08/2021   AKI (acute kidney injury) ([place]) 10/08/2021   Hyponatremia 10/08/2021   Decreased estrogen level 08/01/2021   Dementia ([place]) 08/01/2021   Frail elderly 08/01/2021    Hyperparathyroidism ([place]) 08/01/2021   Spondylolisthesis 08/01/2021   Varicose veins of bilateral lower extremities with other complications 88/41/6606   Lumbago without sciatica 04/30/2021   Abnormal gait 06/11/2020   Adult failure to thrive syndrome 06/11/2020   Allergic rhinitis 06/11/2020   Anxiety 06/11/2020   Asthma 06/11/2020   Benign intracranial hypertension 06/11/2020   Body mass index (BMI) 45.0-49.9, adult ([place]) 06/11/2020   Bowel incontinence 06/11/2020   Cholelithiasis 06/11/2020   Chronic sinusitis 06/11/2020   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   Daytime somnolence 06/11/2020   Diarrhea of presumed infectious origin 06/11/2020   Edema 06/11/2020   Family history of malignant neoplasm of gastrointestinal tract 06/11/2020   Gout 06/11/2020   History of fall 06/11/2020   History of infectious disease 06/11/2020   Insomnia 06/11/2020   Irregular bowel habits 06/11/2020   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   Lumbar spondylosis with myelopathy 06/11/2020   Macrocytosis 06/11/2020   Mild recurrent major depression ([place]) 06/11/2020   Personal history of colonic polyps 06/11/2020   Personal history of malignant neoplasm of breast 06/11/2020   Repeated falls 06/11/2020   Mixed anxiety and depressive disorder 06/11/2020   Irritable bowel syndrome 01/04/2020   Spinal stenosis of lumbar region 01/03/2020   Other symptoms and signs involving the musculoskeletal system 09/11/2019   Bilateral leg weakness 08/01/2019   Leg swelling 08/01/2019   Diabetes mellitus with neuropathy ([place]) 06/15/2019   Hypokalemia 11/08/2018   CKD (chronic kidney disease) stage 3,  GFR 30-59 ml/min (HCC) 11/08/2018   Orthostatic hypotension 07/28/2017   SVD (spontaneous vaginal delivery)    Peripheral neuropathy    On home oxygen therapy    Migraines    Left bundle branch block    Hypothyroidism    Hypertension    Hyperlipidemia    Heart murmur    GERD (gastroesophageal reflux disease)    Exertional  shortness of breath    Major depressive disorder    Back pain    Arthritis    Generalized anxiety disorder    Anemia    Chronic respiratory failure ([place]) 09/14/2013   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   Morbid obesity ([place]) 86/76/1950   Chronic systolic heart failure ([place]) 10/27/2012   Endometrial polyp 01/20/2012   Malignant tumor of breast ([place]) 03/26/2011   Unspecified vitamin D deficiency 03/26/2011    Current Outpatient Medications:    acetaminophen (TYLENOL) 500 MG tablet, Take 1,000 mg by mouth daily as needed (for pain and fever of 101 or greater)., Disp: , Rfl:    acetaZOLAMIDE (DIAMOX) 125 MG tablet, Take 1 tablet (125 mg total) by mouth daily., Disp: 90 tablet, Rfl: 3   Apoaequorin (PREVAGEN EXTRA STRENGTH) 20 MG CAPS, Take 20 mg by mouth daily., Disp: , Rfl:    Ascorbic Acid (VITAMIN C) 1000 MG tablet, Take 1,000 mg by mouth daily., Disp: , Rfl:    buPROPion (WELLBUTRIN XL) 150 MG 24 hr tablet, Take 150 mg by mouth daily., Disp: , Rfl:    carvedilol (COREG) 3.125 MG tablet, TAKE 1 TABLET (3.125 MG) BY MOUTH TWICE DAILY WITH MEALS, Disp: 180 tablet, Rfl: 3   citalopram (CELEXA) 20 MG tablet, Take 20 mg by mouth daily., Disp: , Rfl:    clonazePAM (KLONOPIN) 0.5 MG tablet, Take 0.5 mg by mouth 2 (two) times daily as needed for anxiety., Disp: , Rfl:    cyanocobalamin (VITAMIN B12) 1000 MCG tablet, Take 1,000 mcg by mouth daily., Disp: , Rfl:    dicyclomine (BENTYL) 20 MG tablet, Take 20 mg by mouth 3 (three) times daily before meals., Disp: , Rfl:    famotidine-calcium carbonate-magnesium hydroxide (PEPCID COMPLETE) 10-800-165 MG chewable tablet, Chew 1 tablet by mouth daily as needed., Disp: , Rfl:    fluticasone (FLONASE) 50 MCG/ACT nasal spray, 1 spray in each nostril, Disp: , Rfl:    gabapentin (NEURONTIN) 300 MG capsule, Take 300 mg by mouth 2 (two) times daily., Disp: , Rfl:    levothyroxine (SYNTHROID) 88 MCG tablet, TAKE 1  TABLET(88 MCG) BY MOUTH DAILY BEFORE BREAKFAST, Disp: 90 tablet, Rfl: 3   metolazone (ZAROXOLYN) 2.5 MG tablet, Take 1 tablet by mouth every other Tuesday., Disp: 12 tablet, Rfl: 3   midodrine (PROAMATINE) 10 MG tablet, TAKE 1 TABLET THREE TIMES DAILY WITH MEALS, Disp: 270 tablet, Rfl: 3   Multiple Vitamin (MULTIVITAMIN WITH MINERALS) TABS tablet, Take 1 tablet by mouth daily., Disp: , Rfl:    pantoprazole (PROTONIX) 40 MG tablet, Take 40 mg by mouth daily as needed (for acid reflux). (Patient not taking: Reported on 04/27/2022), Disp: , Rfl:    potassium chloride SA (KLOR-CON M) 20 MEQ tablet, TAKE 2 TABLETS THREE TIMES DAILY. TAKE AN EXTRA 2 TABLETS ON DAYS YOU TAKE METOLAZONE, Disp: 572 tablet, Rfl: 0   simvastatin (ZOCOR) 10 MG tablet, TAKE 1 TABLET EVERY EVENING, Disp: 90 tablet, Rfl: 1   spironolactone (ALDACTONE) 25 MG tablet, TAKE 1 TABLET (25 MG TOTAL) BY MOUTH EVERY EVENING., Disp: 90  tablet, Rfl: 0   torsemide (DEMADEX) 20 MG tablet, Take 4 tablets (80 mg total) by mouth every morning AND 3 tablets (60 mg total) every evening., Disp: 210 tablet, Rfl: 11 Allergies  Allergen Reactions   Ceftin Anaphylaxis    Face and throat swell    Geodon [Ziprasidone Hcl] Hives   Lisinopril Other (See Comments)    angioedema   Cefuroxime     Other reaction(s): anaphylaxis   Sulfacetamide Sodium-Sulfur     Other reaction(s): itch   Allopurinol Nausea Only and Other (See Comments)    weakness   Ativan [Lorazepam] Itching   Sulfa Antibiotics Itching   Valium [Diazepam] Other (See Comments)    Patient states that diazepam doesn't relax, it has the opposite effect.     Social History   Socioeconomic History   Marital status: Married    Spouse name: Not on file   Number of children: 2   Years of education: 39   Highest education level: Master's degree (e.g., MA, MS, MEng, MEd, MSW, MBA)  Occupational History   Occupation: retired  Tobacco Use   Smoking status: Former    Packs/day: 0.10     Years: 26.00    Total pack years: 2.60    Types: Cigarettes    Quit date: 03/23/2021    Years since quitting: 1.1   Smokeless tobacco: Never  Vaping Use   Vaping Use: Never used  Substance and Sexual Activity   Alcohol use: No   Drug use: No   Sexual activity: Yes  Other Topics Concern   Not on file  Social History Narrative   Tobacco Use Cigarettes: Former Smoker, Quit in 2008   No Alcohol   No recreational drug use   Diet: Regular/Low Carb   Exercise: None   Occupation: disabled   Education: Research officer, political party, masters   Children: 2   Firearms: No   Therapist, art Use: 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      Social Determinants of Health   Financial Resource Strain: Not on file  Food Insecurity: Not on file  Transportation Needs: Not on file  Physical Activity: Not on file  Stress: Not on file  Social Connections: Not on file  Intimate Partner Violence: Not on file    Physical Exam      Future Appointments  Date Time Provider [place]  06/01/2022  7:20 AM [facility]  06/10/2022  7:15 AM [facility] DEVICE REMOTES [facility]  06/15/2022  3:30 PM Narda Amber K, DO [facility] None  06/30/2022  2:00 PM Marzetta Board, DPM [facility]  07/07/2022  8:30 AM Hazle Coca, PhD [facility] None  07/07/2022  9:30 AM LBN- NEUROPSYCH TECH [facility] None  07/15/2022 10:00 AM Hazle Coca, PhD [facility] None  09/09/2022  7:15 AM [facility] DEVICE REMOTES [facility]  12/09/2022  7:15 AM [facility] DEVICE REMOTES [facility]     ACTION: Home visit completed

## 2022-05-05 NOTE — Progress Notes (Signed)
[facility] PREP Weekly Session  Patient Details  Name: Rhonda [name] MRN: [id number] Date of Birth: 1951-05-13 Age: 72 y.o. PCP: [name], MD  Vitals:   05/05/22 1430  Weight: 190 lb 9.6 oz (86.5 kg)     [facility] Weekly seesion - 05/05/22 1600       [facility] "PREP" Location   [facility] "PREP" Location [facility]      Weekly Session   Topic Discussed Healthy eating tips    Minutes exercised this week 30 minutes    Classes attended to date [place] 05/05/2022, 4:31 PM

## 2022-05-06 NOTE — Progress Notes (Signed)
[facility] PREP Evaluation  Patient Details  Name: Rhonda [name] MRN: [id number] Date of Birth: 14-Feb-1951 Age: 71 y.o. PCP: Rhonda Smoker, MD  Vitals:   04/30/22 1600  BP: 130/82  Pulse: 99  Weight: 190 lb 9.6 oz (86.5 kg)     [facility] Eval - 05/06/22 0900       Referral    Referring Provider Community Paramedic    Reason for referral Inactivity    Program Start Date 04/21/22   T/TH 230p-345p x 12 wks     Measurement   Waist Circumference 39 inches    Hip Circumference 42.5 inches    Body fat --   not calculated due to AICD     Information for Trainer   Goals get more active, move around better    Current Exercise Just finished PT    Orthopedic Concerns Right wrist, gout    Pertinent Medical History Arthritis, gout, CHF, HTN, DM, Peripheral Neuropathy, CKD3    Current Barriers Memory    Restrictions/Precautions Fall risk;Assistive device    Medications that affect exercise Medication causing dizziness/drowsiness;Beta blocker      Timed Up and Go (TUGS)   Timed Up and Go Moderate risk 10-12 seconds   doesn't always remember to use cane     Mobility and Daily Activities   I find it easy to walk up or down two or more flights of stairs. 1    I have no trouble taking out the trash. 4    I do housework such as vacuuming and dusting on my own without difficulty. 2    I can easily lift a gallon of milk (8lbs). 4    I can easily walk a mile. 1    I have no trouble reaching into high cupboards or reaching down to pick up something from the floor. 4    I do not have trouble doing out-door work such as Armed forces logistics/support/administrative officer, raking leaves, or gardening. 1      Mobility and Daily Activities   I feel younger than my age. 1    I feel independent. 2    I feel energetic. 2    I live an active life.  1    I feel strong. 1    I feel healthy. 2    I feel active as other people my age. 1      How fit and strong are you.   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    Complication of anesthesia    History of low blood pressure after surgery; attributed to lying flat   Diabetes mellitus    "diet controlled" (05/03/2013)   Exertional shortness of breath    Generalized anxiety disorder    GERD (gastroesophageal reflux disease)    Gout    Heart  murmur    Hyperlipidemia    Hypertension    Hypokalemia 11/08/2018   Hypothyroidism    Left bundle branch block    s/p CRT-D (04/2013)   Major depressive disorder    Migraines    On home oxygen therapy    "2L suppose to be q night" (05/03/2013)   Orthostatic hypotension 07/28/2017   Peripheral neuropathy    Feet   SVD (spontaneous vaginal delivery)    x 2   Unspecified vitamin D deficiency 03/26/2011   Does not take meds   Past Surgical History:  Procedure Laterality Date   BI-VENTRICULAR IMPLANTABLE CARDIOVERTER DEFIBRILLATOR N/A 05/03/2013   Procedure: BI-VENTRICULAR IMPLANTABLE CARDIOVERTER DEFIBRILLATOR  (CRT-D);  Surgeon: Evans Lance, MD;  Location: [facility] CATH LAB;  Service: Cardiovascular;  Laterality: N/A;   BI-VENTRICULAR IMPLANTABLE CARDIOVERTER DEFIBRILLATOR  (CRT-D)  05/03/2013   MDT CRTD implanted by Dr Lovena Le for non ischemic cardiomyopathy   BIOPSY  05/31/2018   Procedure: BIOPSY;  Surgeon: Ronnette Juniper, MD;  Location: [facility] ENDOSCOPY;  Service: Gastroenterology;;   [place] N/A 04/11/2020   Procedure: [place];  Surgeon: Evans Lance, MD;  Location: [facility] CV LAB;  Service: Cardiovascular;  Laterality: N/A;   BREAST LUMPECTOMY Left 07/28/2010   CARDIAC CATHETERIZATION  2010   NORMAL CORONARY ARTERIES   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--11 SURGERIES     PT HAS REMOVABLE SPEECH BULB-TAKES IT OUT BEFORE HER SURGERY   COLONOSCOPY     COLONOSCOPY N/A 05/31/2018   Procedure: COLONOSCOPY;  Surgeon: Ronnette Juniper, MD;  Location: [facility] ENDOSCOPY;  Service: Gastroenterology;  Laterality: N/A;   HYSTEROSCOPY WITH D & C  01/20/2012   Procedure: DILATATION AND CURETTAGE /HYSTEROSCOPY;  Surgeon: Margarette Asal, MD;  Location: [facility];  Service: Gynecology;  Laterality: N/A;  with trueclear   PORT-A-CATH REMOVAL  09/23/2011   Procedure: REMOVAL PORT-A-CATH;  Surgeon: Rolm Bookbinder, MD;  Location: [facility];  Service: General;  Laterality: N/A;  Port Removal   PORTACATH PLACEMENT  2012   TONSILLECTOMY  1960's   TOTAL KNEE ARTHROPLASTY  05/10/2012   Procedure: TOTAL KNEE ARTHROPLASTY;  Surgeon: Mauri Pole, MD;  Location: [facility];  Service: Orthopedics;  Laterality: Right;   WISDOM TOOTH EXTRACTION     Social History   Tobacco Use  Smoking Status Former   Packs/day: 0.10   Years: 26.00   Total pack years: 2.60   Types: Cigarettes   Quit date: 03/23/2021   Years since quitting: 1.1  Smokeless Tobacco Never   PREP intake completed late.  [name] 05/06/2022, 9:28 AM

## 2022-05-11 ENCOUNTER — Other Ambulatory Visit (HOSPITAL_COMMUNITY): Payer: Self-pay

## 2022-05-11 NOTE — Progress Notes (Signed)
Paramedicine Encounter    Patient ID: Rhonda [name], female    DOB: 30-Jul-1950, 71 y.o.   MRN: [id number]   Arrived for home visit for Rhonda [name] who reports to be feeling okay overall. She reports no chest pain, dizziness or swelling. She does endorse some shortness of breath on exertion but this is normal for her. No lower leg swelling noted. Weight is down 8 lbs. She has been exercising and going to the [facility] for the PREP program.  She reports she has been eating mostly soup and light meals.   I obtained vitals as noted: WT- 182lbs BP- 150/80 HR- 98 O2- 97% RR- 16  Lungs- clear   Meds reviewed and confirmed. Pill box filled for two weeks. No refills needed.   I plan to see Zosia on 05/26/22 She agreed with plan.   Home visit complete.   [name], EMT-Paramedic [phone] 05/11/2022    Patient Care Team: [name], MD as PCP - General (Family Medicine) [name], LCSW as Social Worker (Licensed Clinical Social Worker) [name], [name], DO as Consulting Physician (Neurology) [name], EMT-P as Paramedic [name] [name], MD as Attending Physician (Family Medicine)  Patient Active Problem List   Diagnosis Date Noted   Elevated troponin 12/02/2021   Nausea & vomiting 12/01/2021   Acute left ankle pain 10/11/2021   Intractable nausea and vomiting 10/08/2021   AKI (acute kidney injury) ([place]) 10/08/2021   Hyponatremia 10/08/2021   Decreased estrogen level 08/01/2021   Dementia ([place]) 08/01/2021   Frail elderly 08/01/2021   Hyperparathyroidism ([place]) 08/01/2021   Spondylolisthesis 08/01/2021   Varicose veins of bilateral lower extremities with other complications 81/05/7508   Lumbago without sciatica 04/30/2021   Abnormal gait 06/11/2020   Adult failure to thrive syndrome 06/11/2020   Allergic rhinitis 06/11/2020   Anxiety 06/11/2020   Asthma 06/11/2020   Benign intracranial hypertension 06/11/2020   Body mass index (BMI) 45.0-49.9,  adult ([place]) 06/11/2020   Bowel incontinence 06/11/2020   Cholelithiasis 06/11/2020   Chronic sinusitis 06/11/2020   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   Daytime somnolence 06/11/2020   Diarrhea of presumed infectious origin 06/11/2020   Edema 06/11/2020   Family history of malignant neoplasm of gastrointestinal tract 06/11/2020   Gout 06/11/2020   History of fall 06/11/2020   History of infectious disease 06/11/2020   Insomnia 06/11/2020   Irregular bowel habits 06/11/2020   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   Lumbar spondylosis with myelopathy 06/11/2020   Macrocytosis 06/11/2020   Mild recurrent major depression ([place]) 06/11/2020   Personal history of colonic polyps 06/11/2020   Personal history of malignant neoplasm of breast 06/11/2020   Repeated falls 06/11/2020   Mixed anxiety and depressive disorder 06/11/2020   Irritable bowel syndrome 01/04/2020   Spinal stenosis of lumbar region 01/03/2020   Other symptoms and signs involving the musculoskeletal system 09/11/2019   Bilateral leg weakness 08/01/2019   Leg swelling 08/01/2019   Diabetes mellitus with neuropathy ([place]) 06/15/2019   Hypokalemia 11/08/2018   CKD (chronic kidney disease) stage 3, GFR 30-59 ml/min (HCC) 11/08/2018   Orthostatic hypotension 07/28/2017   SVD (spontaneous vaginal delivery)    Peripheral neuropathy    On home oxygen therapy    Migraines    Left bundle branch block    Hypothyroidism    Hypertension    Hyperlipidemia    Heart murmur    GERD (gastroesophageal reflux disease)    Exertional shortness of breath    Major depressive disorder  Back pain    Arthritis    Generalized anxiety disorder    Anemia    Chronic respiratory failure ([place]) 09/14/2013   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   Morbid obesity ([place]) 99/37/1696   Chronic systolic heart failure ([place]) 10/27/2012   Endometrial polyp 01/20/2012   Malignant tumor of breast ([place]) 03/26/2011   Unspecified  vitamin D deficiency 03/26/2011    Current Outpatient Medications:    acetaminophen (TYLENOL) 500 MG tablet, Take 1,000 mg by mouth daily as needed (for pain and fever of 101 or greater)., Disp: , Rfl:    acetaZOLAMIDE (DIAMOX) 125 MG tablet, Take 1 tablet (125 mg total) by mouth daily., Disp: 90 tablet, Rfl: 3   Apoaequorin (PREVAGEN EXTRA STRENGTH) 20 MG CAPS, Take 20 mg by mouth daily., Disp: , Rfl:    Ascorbic Acid (VITAMIN C) 1000 MG tablet, Take 1,000 mg by mouth daily., Disp: , Rfl:    buPROPion (WELLBUTRIN XL) 150 MG 24 hr tablet, Take 150 mg by mouth daily., Disp: , Rfl:    carvedilol (COREG) 3.125 MG tablet, TAKE 1 TABLET (3.125 MG) BY MOUTH TWICE DAILY WITH MEALS, Disp: 180 tablet, Rfl: 3   citalopram (CELEXA) 20 MG tablet, Take 20 mg by mouth daily., Disp: , Rfl:    clonazePAM (KLONOPIN) 0.5 MG tablet, Take 0.5 mg by mouth 2 (two) times daily as needed for anxiety., Disp: , Rfl:    cyanocobalamin (VITAMIN B12) 1000 MCG tablet, Take 1,000 mcg by mouth daily., Disp: , Rfl:    dicyclomine (BENTYL) 20 MG tablet, Take 20 mg by mouth 3 (three) times daily before meals., Disp: , Rfl:    famotidine-calcium carbonate-magnesium hydroxide (PEPCID COMPLETE) 10-800-165 MG chewable tablet, Chew 1 tablet by mouth daily as needed., Disp: , Rfl:    fluticasone (FLONASE) 50 MCG/ACT nasal spray, 1 spray in each nostril, Disp: , Rfl:    gabapentin (NEURONTIN) 300 MG capsule, Take 300 mg by mouth 2 (two) times daily., Disp: , Rfl:    levothyroxine (SYNTHROID) 88 MCG tablet, TAKE 1 TABLET(88 MCG) BY MOUTH DAILY BEFORE BREAKFAST, Disp: 90 tablet, Rfl: 3   metolazone (ZAROXOLYN) 2.5 MG tablet, Take 1 tablet by mouth every other Tuesday., Disp: 12 tablet, Rfl: 3   midodrine (PROAMATINE) 10 MG tablet, TAKE 1 TABLET THREE TIMES DAILY WITH MEALS, Disp: 270 tablet, Rfl: 3   Multiple Vitamin (MULTIVITAMIN WITH MINERALS) TABS tablet, Take 1 tablet by mouth daily., Disp: , Rfl:    pantoprazole (PROTONIX) 40 MG  tablet, Take 40 mg by mouth daily as needed (for acid reflux). (Patient not taking: Reported on 04/27/2022), Disp: , Rfl:    potassium chloride SA (KLOR-CON M) 20 MEQ tablet, TAKE 2 TABLETS THREE TIMES DAILY. TAKE AN EXTRA 2 TABLETS ON DAYS YOU TAKE METOLAZONE, Disp: 572 tablet, Rfl: 0   simvastatin (ZOCOR) 10 MG tablet, TAKE 1 TABLET EVERY EVENING, Disp: 90 tablet, Rfl: 1   spironolactone (ALDACTONE) 25 MG tablet, TAKE 1 TABLET (25 MG TOTAL) BY MOUTH EVERY EVENING., Disp: 90 tablet, Rfl: 0   torsemide (DEMADEX) 20 MG tablet, Take 4 tablets (80 mg total) by mouth every morning AND 3 tablets (60 mg total) every evening., Disp: 210 tablet, Rfl: 11 Allergies  Allergen Reactions   Ceftin Anaphylaxis    Face and throat swell    Geodon [Ziprasidone Hcl] Hives   Lisinopril Other (See Comments)    angioedema   Cefuroxime     Other reaction(s): anaphylaxis   Sulfacetamide Sodium-Sulfur  Other reaction(s): itch   Allopurinol Nausea Only and Other (See Comments)    weakness   Ativan [Lorazepam] Itching   Sulfa Antibiotics Itching   Valium [Diazepam] Other (See Comments)    Patient states that diazepam doesn't relax, it has the opposite effect.     Social History   Socioeconomic History   Marital status: Married    Spouse name: Not on file   Number of children: 2   Years of education: 78   Highest education level: Master's degree (e.g., MA, MS, MEng, MEd, MSW, MBA)  Occupational History   Occupation: retired  Tobacco Use   Smoking status: Former    Packs/day: 0.10    Years: 26.00    Total pack years: 2.60    Types: Cigarettes    Quit date: 03/23/2021    Years since quitting: 1.1   Smokeless tobacco: Never  Vaping Use   Vaping Use: Never used  Substance and Sexual Activity   Alcohol use: No   Drug use: No   Sexual activity: Yes  Other Topics Concern   Not on file  Social History Narrative   Tobacco Use Cigarettes: Former Smoker, Quit in 2008   No Alcohol   No recreational  drug use   Diet: Regular/Low Carb   Exercise: None   Occupation: disabled   Education: Research officer, political party, masters   Children: 2   Firearms: No   Therapist, art Use: 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      Social Determinants of Health   Financial Resource Strain: Not on file  Food Insecurity: Not on file  Transportation Needs: Not on file  Physical Activity: Not on file  Stress: Not on file  Social Connections: Not on file  Intimate Partner Violence: Not on file    Physical Exam      Future Appointments  Date Time Provider [place]  06/01/2022  7:20 AM [facility] DEVICE [facility]ES [facility]  06/10/2022  7:15 AM [facility] DEVICE REMOTES [facility]  06/15/2022  3:30 PM Narda Amber K, DO [facility] None  06/30/2022  2:00 PM Marzetta Board, DPM [facility]  07/07/2022  8:30 AM Hazle Coca, PhD [facility] None  07/07/2022  9:30 AM LBN- NEUROPSYCH TECH [facility] None  07/15/2022 10:00 AM Hazle Coca, PhD [facility] None  09/09/2022  7:15 AM [facility] DEVICE REM[facility]ES [facility]  12/09/2022  7:15 AM [facility] DEVICE REMOTES [facility]     ACTION: Home visit completed

## 2022-05-22 DIAGNOSIS — Z03818 Encounter for observation for suspected exposure to other biological agents ruled out: Secondary | ICD-10-CM | POA: Diagnosis not present

## 2022-05-22 DIAGNOSIS — R051 Acute cough: Secondary | ICD-10-CM | POA: Diagnosis not present

## 2022-05-22 DIAGNOSIS — J029 Acute pharyngitis, unspecified: Secondary | ICD-10-CM | POA: Diagnosis not present

## 2022-05-22 DIAGNOSIS — J069 Acute upper respiratory infection, unspecified: Secondary | ICD-10-CM | POA: Diagnosis not present

## 2022-05-26 ENCOUNTER — Other Ambulatory Visit (HOSPITAL_COMMUNITY): Payer: Self-pay

## 2022-05-26 NOTE — Progress Notes (Signed)
Paramedicine Encounter    Patient ID: Rhonda [name], female    DOB: 24-Oct-1950, 72 y.o.   MRN: [id number]  Arrived for home visit for Rhonda [name] who reports to be feeling okay today. She denied severe shortness of breath, chest pain or feeling dizzy. She states that she has some mild shortness of breath when moving around a lot. She has been resting most hours of the days and has not been exercising but plans to start back next week- she has been off PREP due to [place].   I reviewed meds- pill box noted to have several missed doses. She reports she simply forgot. I talked to her husband who reports she is not waking up for set alarms to take medications. I talked to them both and reminded them the importance of med compliance. They verbalized understanding.   I reviewed meds and filled pill box for one week. [facility]ills [facility]: -Torsemide   I reviewed HF management including diet, salt intake, fluid intake, exercise, med compliance. She agreed to get back on track.   Vitals obtained: WT- 197lbs  BP- 130/70 HR- 86 O2- 99% RR- 16 Lungs- clear Edema- none noted   She has not been weighing daily- weight is up 15 lbs from last home paramedicine visit on 12/18. I advised her to weigh daily and record same. She took metolazone today and extra potassium and I plan to reach back out Thursday to see how weight is changing. I stressed her diet to her and how important it is to eat low sodium and take medications daily. She verbalized understanding.   I will see Rhonda [name] next Monday in the home. Home visit complete.   [name], EMT-Paramedic [phone] 05/26/2022      Patient Care Team: [name], MD as PCP - General (Family Medicine) [name], LCSW as Social Worker (Licensed Clinical Social Worker) [name], [name], DO as Consulting Physician (Neurology) [name], Paramedic as Paramedic [name] [name], MD as Attending Physician (Family  Medicine)  Patient Active Problem List   Diagnosis Date Noted   Elevated troponin 12/02/2021   Nausea & vomiting 12/01/2021   Acute left ankle pain 10/11/2021   Intractable nausea and vomiting 10/08/2021   AKI (acute kidney injury) ([place]) 10/08/2021   Hyponatremia 10/08/2021   Decreased estrogen level 08/01/2021   Dementia ([place]) 08/01/2021   Frail elderly 08/01/2021   Hyperparathyroidism ([place]) 08/01/2021   Spondylolisthesis 08/01/2021   Varicose veins of bilateral lower extremities with other complications 21/22/4825   Lumbago without sciatica 04/30/2021   Abnormal gait 06/11/2020   Adult failure to thrive syndrome 06/11/2020   Allergic rhinitis 06/11/2020   Anxiety 06/11/2020   Asthma 06/11/2020   Benign intracranial hypertension 06/11/2020   Body mass index (BMI) 45.0-49.9, adult ([place]) 06/11/2020   Bowel incontinence 06/11/2020   Cholelithiasis 06/11/2020   Chronic sinusitis 06/11/2020   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   Daytime somnolence 06/11/2020   Diarrhea of presumed infectious origin 06/11/2020   Edema 06/11/2020   Family history of malignant neoplasm of gastrointestinal tract 06/11/2020   Gout 06/11/2020   History of fall 06/11/2020   History of infectious disease 06/11/2020   Insomnia 06/11/2020   Irregular bowel habits 06/11/2020   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   Lumbar spondylosis with myelopathy 06/11/2020   Macrocytosis 06/11/2020   Mild recurrent major depression ([place]) 06/11/2020   Personal history of colonic polyps 06/11/2020   Personal history of malignant neoplasm of breast 06/11/2020   Repeated falls  06/11/2020   Mixed anxiety and depressive disorder 06/11/2020   Irritable bowel syndrome 01/04/2020   Spinal stenosis of lumbar region 01/03/2020   Other symptoms and signs involving the musculoskeletal system 09/11/2019   Bilateral leg weakness 08/01/2019   Leg swelling 08/01/2019   Diabetes mellitus with neuropathy ([place]) 06/15/2019   Hypokalemia 11/08/2018    CKD (chronic kidney disease) stage 3, GFR 30-59 ml/min (HCC) 11/08/2018   Orthostatic hypotension 07/28/2017   SVD (spontaneous vaginal delivery)    Peripheral neuropathy    On home oxygen therapy    Migraines    Left bundle branch block    Hypothyroidism    Hypertension    Hyperlipidemia    Heart murmur    GERD (gastroesophageal reflux disease)    Exertional shortness of breath    Major depressive disorder    Back pain    Arthritis    Generalized anxiety disorder    Anemia    Chronic respiratory failure ([place]) 09/14/2013   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   Morbid obesity ([place]) 65/99/3570   Chronic systolic heart failure ([place]) 10/27/2012   Endometrial polyp 01/20/2012   Malignant tumor of breast ([place]) 03/26/2011   Unspecified vitamin D deficiency 03/26/2011    Current Outpatient Medications:    acetaminophen (TYLENOL) 500 MG tablet, Take 1,000 mg by mouth daily as needed (for pain and fever of 101 or greater)., Disp: , Rfl:    acetaZOLAMIDE (DIAMOX) 125 MG tablet, Take 1 tablet (125 mg total) by mouth daily., Disp: 90 tablet, Rfl: 3   Apoaequorin (PREVAGEN EXTRA STRENGTH) 20 MG CAPS, Take 20 mg by mouth daily., Disp: , Rfl:    Ascorbic Acid (VITAMIN C) 1000 MG tablet, Take 1,000 mg by mouth daily., Disp: , Rfl:    buPROPion (WELLBUTRIN XL) 150 MG 24 hr tablet, Take 150 mg by mouth daily., Disp: , Rfl:    carvedilol (COREG) 3.125 MG tablet, TAKE 1 TABLET (3.125 MG) BY MOUTH TWICE DAILY WITH MEALS, Disp: 180 tablet, Rfl: 3   citalopram (CELEXA) 20 MG tablet, Take 20 mg by mouth daily., Disp: , Rfl:    clonazePAM (KLONOPIN) 0.5 MG tablet, Take 0.5 mg by mouth 2 (two) times daily as needed for anxiety., Disp: , Rfl:    cyanocobalamin (VITAMIN B12) 1000 MCG tablet, Take 1,000 mcg by mouth daily., Disp: , Rfl:    dicyclomine (BENTYL) 20 MG tablet, Take 20 mg by mouth 3 (three) times daily before meals., Disp: , Rfl:     famotidine-calcium carbonate-magnesium hydroxide (PEPCID COMPLETE) 10-800-165 MG chewable tablet, Chew 1 tablet by mouth daily as needed., Disp: , Rfl:    fluticasone (FLONASE) 50 MCG/ACT nasal spray, 1 spray in each nostril, Disp: , Rfl:    gabapentin (NEURONTIN) 300 MG capsule, Take 300 mg by mouth 2 (two) times daily., Disp: , Rfl:    levothyroxine (SYNTHROID) 88 MCG tablet, TAKE 1 TABLET(88 MCG) BY MOUTH DAILY BEFORE BREAKFAST, Disp: 90 tablet, Rfl: 3   metolazone (ZAROXOLYN) 2.5 MG tablet, Take 1 tablet by mouth every other Tuesday., Disp: 12 tablet, Rfl: 3   midodrine (PROAMATINE) 10 MG tablet, TAKE 1 TABLET THREE TIMES DAILY WITH MEALS, Disp: 270 tablet, Rfl: 3   Multiple Vitamin (MULTIVITAMIN WITH MINERALS) TABS tablet, Take 1 tablet by mouth daily., Disp: , Rfl:    pantoprazole (PROTONIX) 40 MG tablet, Take 40 mg by mouth daily as needed (for acid reflux). (Patient not taking: Reported on 04/27/2022), Disp: , Rfl:  potassium chloride SA (KLOR-CON M) 20 MEQ tablet, TAKE 2 TABLETS THREE TIMES DAILY. TAKE AN EXTRA 2 TABLETS ON DAYS YOU TAKE METOLAZONE, Disp: 572 tablet, Rfl: 0   simvastatin (ZOCOR) 10 MG tablet, TAKE 1 TABLET EVERY EVENING, Disp: 90 tablet, Rfl: 1   spironolactone (ALDACTONE) 25 MG tablet, TAKE 1 TABLET (25 MG TOTAL) BY MOUTH EVERY EVENING., Disp: 90 tablet, Rfl: 0   torsemide (DEMADEX) 20 MG tablet, Take 4 tablets (80 mg total) by mouth every morning AND 3 tablets (60 mg total) every evening., Disp: 210 tablet, Rfl: 11 Allergies  Allergen Reactions   Ceftin Anaphylaxis    Face and throat swell    Geodon [Ziprasidone Hcl] Hives   Lisinopril Other (See Comments)    angioedema   Cefuroxime     Other reaction(s): anaphylaxis   Sulfacetamide Sodium-Sulfur     Other reaction(s): itch   Allopurinol Nausea Only and Other (See Comments)    weakness   Ativan [Lorazepam] Itching   Sulfa Antibiotics Itching   Valium [Diazepam] Other (See Comments)    Patient states that  diazepam doesn't relax, it has the opposite effect.     Social History   Socioeconomic History   Marital status: Married    Spouse name: Not on file   Number of children: 2   Years of education: 27   Highest education level: Master's degree (e.g., MA, MS, MEng, MEd, MSW, MBA)  Occupational History   Occupation: retired  Tobacco Use   Smoking status: Former    Packs/day: 0.10    Years: 26.00    Total pack years: 2.60    Types: Cigarettes    Quit date: 03/23/2021    Years since quitting: 1.1   Smokeless tobacco: Never  Vaping Use   Vaping Use: Never used  Substance and Sexual Activity   Alcohol use: No   Drug use: No   Sexual activity: Yes  Other Topics Concern   Not on file  Social History Narrative   Tobacco Use Cigarettes: Former Smoker, Quit in 2008   No Alcohol   No recreational drug use   Diet: Regular/Low Carb   Exercise: None   Occupation: disabled   Education: Research officer, political party, masters   Children: 2   Firearms: No   Therapist, art Use: 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      Social Determinants of Health   Financial Resource Strain: Not on file  Food Insecurity: Not on file  Transportation Needs: Not on file  Physical Activity: Not on file  Stress: Not on file  Social Connections: Not on file  Intimate Partner Violence: Not on file    Physical Exam      Future Appointments  Date Time Provider [place]  06/01/2022  7:20 AM [facility] DEVICE REMOTES [facility]  06/10/2022  7:15 AM [facility] DEVICE REMOTES [facility]  06/15/2022  3:30 PM Narda Amber K, DO [facility] None  06/30/2022  2:00 PM Marzetta Board, DPM [facility]  07/07/2022  8:30 AM Hazle Coca, PhD [facility] None  07/07/2022  9:30 AM [facility]- NEUROPSYCH TECH [facility] None  07/15/2022 10:00 AM Hazle Coca, PhD [facility] None  09/09/2022  7:15 AM [facility] DEVICE REMOTES [facility]  12/09/2022  7:15 AM  [facility]ICE REMOTES [facility]     ACTION: Home visit completed

## 2022-05-27 ENCOUNTER — Telehealth (HOSPITAL_COMMUNITY): Payer: Self-pay | Admitting: Licensed Clinical Social Worker

## 2022-05-27 NOTE — Telephone Encounter (Signed)
HF Paramedicine Team Based Care Meeting  HF MD- NA  HF NP - [place] NP-C   [facility] admit within the last 30 days for heart failure? no   Eligible for discharge? Requires visits 1-2 weeks- non compliant with less frequent visits.  [name], LCSW Clinical Social Worker [facility] Desk#: [phone] Cell#: [phone]

## 2022-05-28 DIAGNOSIS — M10072 Idiopathic gout, left ankle and foot: Secondary | ICD-10-CM | POA: Diagnosis not present

## 2022-06-01 ENCOUNTER — Ambulatory Visit (INDEPENDENT_AMBULATORY_CARE_PROVIDER_SITE_OTHER): Payer: Medicare HMO

## 2022-06-01 ENCOUNTER — Other Ambulatory Visit (HOSPITAL_COMMUNITY): Payer: Self-pay

## 2022-06-01 DIAGNOSIS — Z95810 Presence of automatic (implantable) cardiac defibrillator: Secondary | ICD-10-CM | POA: Diagnosis not present

## 2022-06-01 DIAGNOSIS — I5022 Chronic systolic (congestive) heart failure: Secondary | ICD-10-CM | POA: Diagnosis not present

## 2022-06-01 NOTE — Progress Notes (Signed)
Paramedicine Encounter    Patient ID: Rhonda [name], female    DOB: 04/21/1951, 72 y.o.   MRN: [id number]   Arrived for home visit for [facility]. She greeted me at the door alert and oriented, slightly short winded during exertion which is normal for her.   She reports feeling okay but having some continuing hand pain from a gout flare up she had late last week- PCP called in Prednisone '50mg'$  daily for 5 days she will complete same on Wednesday. I placed remaining in pill box to ensure she completes regimen.   I obtained vitals explaining to her that her fluid seemed elevated per her Optivol device. She was noting to have gained some weight last week after the holidays, admitting to eating poorly and missing several doses of medications. Since our last visit she only missed one noon dose of her medications.   WT- 196lbs BP- 130/72 HR- 85 RR- 16 O2- 99% Lungs- clear Edema- none   I filled pill box for one week.   No refills needed.   I wrote down and reviewed HF management with sodium, fluid intake and importance to weigh daily.   She starts back with PREP Program tomorrow.   I reviewed appointments and confirmed same. I will see [place] in the home in one week.   Pending changes from Sharman Cheek and Dr. Haroldine Laws. If changes are made I will return for visit sooner. She agreed.   Home visit complete.   [name], EMT-Paramedic [phone] 06/01/2022      Patient Care Team: [name], MD as PCP - General (Family Medicine) [name], LCSW as Social Worker (Licensed Clinical Social Worker) [name], [name], DO as Consulting Physician (Neurology) [name], Paramedic as Paramedic [name] [name], MD as Attending Physician (Family Medicine)  Patient Active Problem List   Diagnosis Date Noted   Elevated troponin 12/02/2021   Nausea & vomiting 12/01/2021   Acute left ankle pain 10/11/2021   Intractable nausea and vomiting 10/08/2021   AKI  (acute kidney injury) ([place]) 10/08/2021   Hyponatremia 10/08/2021   Decreased estrogen level 08/01/2021   Dementia ([place]) 08/01/2021   Frail elderly 08/01/2021   Hyperparathyroidism ([place]) 08/01/2021   Spondylolisthesis 08/01/2021   Varicose veins of bilateral lower extremities with other complications 83/15/1761   Lumbago without sciatica 04/30/2021   Abnormal gait 06/11/2020   Adult failure to thrive syndrome 06/11/2020   Allergic rhinitis 06/11/2020   Anxiety 06/11/2020   Asthma 06/11/2020   Benign intracranial hypertension 06/11/2020   Body mass index (BMI) 45.0-49.9, adult ([place]) 06/11/2020   Bowel incontinence 06/11/2020   Cholelithiasis 06/11/2020   Chronic sinusitis 06/11/2020   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   Daytime somnolence 06/11/2020   Diarrhea of presumed infectious origin 06/11/2020   Edema 06/11/2020   Family history of malignant neoplasm of gastrointestinal tract 06/11/2020   Gout 06/11/2020   History of fall 06/11/2020   History of infectious disease 06/11/2020   Insomnia 06/11/2020   Irregular bowel habits 06/11/2020   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   Lumbar spondylosis with myelopathy 06/11/2020   Macrocytosis 06/11/2020   Mild recurrent major depression ([place]) 06/11/2020   Personal history of colonic polyps 06/11/2020   Personal history of malignant neoplasm of breast 06/11/2020   Repeated falls 06/11/2020   Mixed anxiety and depressive disorder 06/11/2020   Irritable bowel syndrome 01/04/2020   Spinal stenosis of lumbar region 01/03/2020   Other symptoms and signs involving the musculoskeletal system 09/11/2019   Bilateral  leg weakness 08/01/2019   Leg swelling 08/01/2019   Diabetes mellitus with neuropathy ([place]) 06/15/2019   Hypokalemia 11/08/2018   CKD (chronic kidney disease) stage 3, GFR 30-59 ml/min (HCC) 11/08/2018   Orthostatic hypotension 07/28/2017   SVD (spontaneous vaginal delivery)    Peripheral neuropathy    On home oxygen therapy    Migraines     Left bundle branch block    Hypothyroidism    Hypertension    Hyperlipidemia    Heart murmur    GERD (gastroesophageal reflux disease)    Exertional shortness of breath    Major depressive disorder    Back pain    Arthritis    Generalized anxiety disorder    Anemia    Chronic respiratory failure ([place]) 09/14/2013   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   Morbid obesity ([place]) 85/06/7739   Chronic systolic heart failure ([place]) 10/27/2012   Endometrial polyp 01/20/2012   Malignant tumor of breast ([place]) 03/26/2011   Unspecified vitamin D deficiency 03/26/2011    Current Outpatient Medications:    acetaminophen (TYLENOL) 500 MG tablet, Take 1,000 mg by mouth daily as needed (for pain and fever of 101 or greater)., Disp: , Rfl:    acetaZOLAMIDE (DIAMOX) 125 MG tablet, Take 1 tablet (125 mg total) by mouth daily., Disp: 90 tablet, Rfl: 3   Apoaequorin (PREVAGEN EXTRA STRENGTH) 20 MG CAPS, Take 20 mg by mouth daily., Disp: , Rfl:    Ascorbic Acid (VITAMIN C) 1000 MG tablet, Take 1,000 mg by mouth daily., Disp: , Rfl:    buPROPion (WELLBUTRIN XL) 150 MG 24 hr tablet, Take 150 mg by mouth daily., Disp: , Rfl:    carvedilol (COREG) 3.125 MG tablet, TAKE 1 TABLET (3.125 MG) BY MOUTH TWICE DAILY WITH MEALS, Disp: 180 tablet, Rfl: 3   citalopram (CELEXA) 20 MG tablet, Take 20 mg by mouth daily., Disp: , Rfl:    clonazePAM (KLONOPIN) 0.5 MG tablet, Take 0.5 mg by mouth 2 (two) times daily as needed for anxiety., Disp: , Rfl:    cyanocobalamin (VITAMIN B12) 1000 MCG tablet, Take 1,000 mcg by mouth daily., Disp: , Rfl:    dicyclomine (BENTYL) 20 MG tablet, Take 20 mg by mouth 3 (three) times daily before meals., Disp: , Rfl:    famotidine-calcium carbonate-magnesium hydroxide (PEPCID COMPLETE) 10-800-165 MG chewable tablet, Chew 1 tablet by mouth daily as needed., Disp: , Rfl:    fluticasone (FLONASE) 50 MCG/ACT nasal spray, 1 spray in each nostril, Disp: ,  Rfl:    gabapentin (NEURONTIN) 300 MG capsule, Take 300 mg by mouth 2 (two) times daily., Disp: , Rfl:    levothyroxine (SYNTHROID) 88 MCG tablet, TAKE 1 TABLET(88 MCG) BY MOUTH DAILY BEFORE BREAKFAST, Disp: 90 tablet, Rfl: 3   metolazone (ZAROXOLYN) 2.5 MG tablet, Take 1 tablet by mouth every other Tuesday., Disp: 12 tablet, Rfl: 3   midodrine (PROAMATINE) 10 MG tablet, TAKE 1 TABLET THREE TIMES DAILY WITH MEALS, Disp: 270 tablet, Rfl: 3   Multiple Vitamin (MULTIVITAMIN WITH MINERALS) TABS tablet, Take 1 tablet by mouth daily., Disp: , Rfl:    pantoprazole (PROTONIX) 40 MG tablet, Take 40 mg by mouth daily as needed (for acid reflux). (Patient not taking: Reported on 04/27/2022), Disp: , Rfl:    potassium chloride SA (KLOR-CON M) 20 MEQ tablet, TAKE 2 TABLETS THREE TIMES DAILY. TAKE AN EXTRA 2 TABLETS ON DAYS YOU TAKE METOLAZONE, Disp: 572 tablet, Rfl: 0   simvastatin (ZOCOR) 10 MG tablet, TAKE  1 TABLET EVERY EVENING, Disp: 90 tablet, Rfl: 1   spironolactone (ALDACTONE) 25 MG tablet, TAKE 1 TABLET (25 MG TOTAL) BY MOUTH EVERY EVENING., Disp: 90 tablet, Rfl: 0   torsemide (DEMADEX) 20 MG tablet, Take 4 tablets (80 mg total) by mouth every morning AND 3 tablets (60 mg total) every evening., Disp: 210 tablet, Rfl: 11 Allergies  Allergen Reactions   Ceftin Anaphylaxis    Face and throat swell    Geodon [Ziprasidone Hcl] Hives   Lisinopril Other (See Comments)    angioedema   Cefuroxime     Other reaction(s): anaphylaxis   Sulfacetamide Sodium-Sulfur     Other reaction(s): itch   Allopurinol Nausea Only and Other (See Comments)    weakness   Ativan [Lorazepam] Itching   Sulfa Antibiotics Itching   Valium [Diazepam] Other (See Comments)    Patient states that diazepam doesn't relax, it has the opposite effect.     Social History   Socioeconomic History   Marital status: Married    Spouse name: Not on file   Number of children: 2   Years of education: 8   Highest education level:  Master's degree (e.g., MA, MS, MEng, MEd, MSW, MBA)  Occupational History   Occupation: retired  Tobacco Use   Smoking status: Former    Packs/day: 0.10    Years: 26.00    Total pack years: 2.60    Types: Cigarettes    Quit date: 03/23/2021    Years since quitting: 1.1   Smokeless tobacco: Never  Vaping Use   Vaping Use: Never used  Substance and Sexual Activity   Alcohol use: No   Drug use: No   Sexual activity: Yes  Other Topics Concern   Not on file  Social History Narrative   Tobacco Use Cigarettes: Former Smoker, Quit in 2008   No Alcohol   No recreational drug use   Diet: Regular/Low Carb   Exercise: None   Occupation: disabled   Education: Research officer, political party, masters   Children: 2   Firearms: No   Therapist, art Use: 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      Social Determinants of Health   Financial Resource Strain: Not on file  Food Insecurity: Not on file  Transportation Needs: Not on file  Physical Activity: Not on file  Stress: Not on file  Social Connections: Not on file  Intimate Partner Violence: Not on file    Physical Exam      Future Appointments  Date Time Provider [place]  06/10/2022  7:15 AM [facility] DEVICE REMOTES [facility]  06/15/2022  3:30 PM Narda Amber K, DO [facility] None  06/30/2022  2:00 PM Marzetta Board, DPM [facility]  07/07/2022  8:30 AM Hazle Coca, PhD [facility] None  07/07/2022  9:30 AM LBN- NEUROPSYCH TECH [facility] None  07/15/2022 10:00 AM Hazle Coca, PhD [facility] None  09/09/2022  7:15 AM [facility]ICE [facility]  12/09/2022  7:15 AM [facility] DEVICE REMOTES [facility]     ACTION: Home visit completed

## 2022-06-01 NOTE — Progress Notes (Unsigned)
EPIC Encounter for ICM Monitoring  Patient Name: Rhonda [name] is a 72 y.o. female Date: 06/01/2022 Primary Care Physican: [name], MD Primary Cardiologist: Bensimhon Electrophysiologist: Santina Evans Pacing: 98.5%   03/24/2022 Weight: 191 lbs 04/13/2022 Weight: 192 lbs 11/27/023 Weight: 190 lbs 05/26/2022 Weight: 197 lbs 06/01/2022 Weight: 193 lbs           Spoke with husband per DPR.  He reports patient is doing fine and weight has decreased to 193 lbs.  Transmission reviewed.                 Spoke with [name], [name].  She reported patient missed several days of medication last week and was eating foods very high in salt.  [name] discussed diet and med compliance with patient and husband at        home visit last week.   Pt's weight was up last week.         Optivol thoracic impedance suggesting possible fluid accumulation starting 1/5.  Impedance also suggesting possible fluid accumulation from 12/23-1/3.      Prescribed:  Patient participating in paramedicine program to assist with meds. Torsemide 20 mg 4 tablets (80 mg total) by mouth every morning and 3 tablets (60 mg total) every evening.   Potassium 20 meq 2 tablets (40 mEq total) by mouth 3 (three) times daily.  Take extra 2 tablets on days you take Metolazone.   Metolazone 2.'5mg'$  1 tablet every other Tuesday.  (next dosage due 06/09/22) Spironolactone 25 mg take 1 tablet by mouth every evening.   Labs: 04/08/2022 Creatinine 1.60, BUN 31, Potassium 4.1, Sodium 138, GFR 35 03/18/2022 Creatinine 2.12, BUN 43, Potassium 4.4, Sodium 134, GFR 25  02/22/2022 Creatinine 1.67, BUN 39, Potassium 3.9, Sodium 134, GFR 33 02/04/2022 Creatinine 1.71, BUN 18, Potassium 4.4, Sodium 136, GFR 32  A complete set of results can be found in Results Review.   Recommendations:   Copy to Dr Haroldine Laws for review and recommendations.     Follow-up plan: ICM clinic phone appointment on 06/10/2022 to recheck fluid  levels.   91 day device clinic remote transmission 06/10/2022.     EP/Cardiology Office Visits:   Recall 07/06/2022 with Dr Haroldine Laws.  Recall 03/20/2023 with Dr Lovena Le.     Copy of ICM check sent to Dr. Lovena Le.   3 month ICM trend: 06/01/2022.    12-14 Month ICM trend:     [name], RN 06/01/2022 8:46 AM

## 2022-06-08 ENCOUNTER — Other Ambulatory Visit (HOSPITAL_COMMUNITY): Payer: Self-pay

## 2022-06-08 NOTE — Progress Notes (Signed)
Paramedicine Encounter    Patient ID: Rhonda [name], female    DOB: Nov 09, 1950, 72 y.o.   MRN: [id number]   Complaints-headache, low energy, short of breath   Assessment- A&Ox4, ambulating well with no obvious shortness of breath while ambulating, no lower leg swelling.   Compliance with meds- missed one dose Saturday EVE   Pill box filled for one week.   Refills needed- Bentyl (called into [facility])  Meds changes since last visit- NONE     Social changes- NONE    BP 130/82   Pulse 98   Resp 18   Wt 192 lb (87.1 kg)   SpO2 98%   BMI 34.01 kg/m  Weight yesterday-did not weigh Last visit weight-196lbs Weight today- 192lbs   Arrived for home visit for Rhonda [name] who was reporting to be feeling okay but having headache, low energy and says she is having shortness of breath on exertion. I witnessed her ambulate from several rooms to the room she was in and noted no shortness of breath during our visit. She has no lower leg swelling noted on assessment. She weighed and is down 4lbs from our last visit. Vitals as noted. She missed one dose over the weekend on Saturday. She states they had a funeral and she forgot.   I reviewed meds and diet with her and her husband during our visit today. They both verbalized understanding and agree with sodium and fluid restrictions as well as medication compliance.   She plans to go to [employer]- she did not go last week.   Appointments confirmed. Home Visit complete. I will see Rhonda [name] in one week.    [name], [place]  ACTION: Home visit completed    Patient Care Team: [name], MD as PCP - General (Family Medicine) [name], LCSW as Social Worker (Licensed Clinical Social Worker) [name], [name], DO as Consulting Physician (Neurology) [name], Paramedic as Paramedic [name] [name], MD as Attending Physician (Family Medicine)  Patient Active Problem List   Diagnosis  Date Noted   Elevated troponin 12/02/2021   Nausea & vomiting 12/01/2021   Acute left ankle pain 10/11/2021   Intractable nausea and vomiting 10/08/2021   AKI (acute kidney injury) ([place]) 10/08/2021   Hyponatremia 10/08/2021   Decreased estrogen level 08/01/2021   Dementia ([place]) 08/01/2021   Frail elderly 08/01/2021   Hyperparathyroidism ([place]) 08/01/2021   Spondylolisthesis 08/01/2021   Varicose veins of bilateral lower extremities with other complications 41/32/4401   Lumbago without sciatica 04/30/2021   Abnormal gait 06/11/2020   Adult failure to thrive syndrome 06/11/2020   Allergic rhinitis 06/11/2020   Anxiety 06/11/2020   Asthma 06/11/2020   Benign intracranial hypertension 06/11/2020   Body mass index (BMI) 45.0-49.9, adult ([place]) 06/11/2020   Bowel incontinence 06/11/2020   Cholelithiasis 06/11/2020   Chronic sinusitis 06/11/2020   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   Daytime somnolence 06/11/2020   Diarrhea of presumed infectious origin 06/11/2020   Edema 06/11/2020   Family history of malignant neoplasm of gastrointestinal tract 06/11/2020   Gout 06/11/2020   History of fall 06/11/2020   History of infectious disease 06/11/2020   Insomnia 06/11/2020   Irregular bowel habits 06/11/2020   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   Lumbar spondylosis with myelopathy 06/11/2020   Macrocytosis 06/11/2020   Mild recurrent major depression ([place]) 06/11/2020   Personal history of colonic polyps 06/11/2020   Personal history of malignant neoplasm of breast 06/11/2020   Repeated falls 06/11/2020   Mixed  anxiety and depressive disorder 06/11/2020   Irritable bowel syndrome 01/04/2020   Spinal stenosis of lumbar region 01/03/2020   Other symptoms and signs involving the musculoskeletal system 09/11/2019   Bilateral leg weakness 08/01/2019   Leg swelling 08/01/2019   Diabetes mellitus with neuropathy ([place]) 06/15/2019   Hypokalemia 11/08/2018   CKD (chronic kidney disease) stage 3, GFR 30-59  ml/min (HCC) 11/08/2018   Orthostatic hypotension 07/28/2017   SVD (spontaneous vaginal delivery)    Peripheral neuropathy    On home oxygen therapy    Migraines    Left bundle branch block    Hypothyroidism    Hypertension    Hyperlipidemia    Heart murmur    GERD (gastroesophageal reflux disease)    Exertional shortness of breath    Major depressive disorder    Back pain    Arthritis    Generalized anxiety disorder    Anemia    Chronic respiratory failure ([place]) 09/14/2013   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   Morbid obesity ([place]) 54/62/7035   Chronic systolic heart failure ([place]) 10/27/2012   Endometrial polyp 01/20/2012   Malignant tumor of breast ([place]) 03/26/2011   Unspecified vitamin D deficiency 03/26/2011    Current Outpatient Medications:    acetaminophen (TYLENOL) 500 MG tablet, Take 1,000 mg by mouth daily as needed (for pain and fever of 101 or greater)., Disp: , Rfl:    acetaZOLAMIDE (DIAMOX) 125 MG tablet, Take 1 tablet (125 mg total) by mouth daily., Disp: 90 tablet, Rfl: 3   Apoaequorin (PREVAGEN EXTRA STRENGTH) 20 MG CAPS, Take 20 mg by mouth daily., Disp: , Rfl:    Ascorbic Acid (VITAMIN C) 1000 MG tablet, Take 1,000 mg by mouth daily., Disp: , Rfl:    buPROPion (WELLBUTRIN XL) 150 MG 24 hr tablet, Take 150 mg by mouth daily., Disp: , Rfl:    carvedilol (COREG) 3.125 MG tablet, TAKE 1 TABLET (3.125 MG) BY MOUTH TWICE DAILY WITH MEALS, Disp: 180 tablet, Rfl: 3   citalopram (CELEXA) 20 MG tablet, Take 20 mg by mouth daily., Disp: , Rfl:    clonazePAM (KLONOPIN) 0.5 MG tablet, Take 0.5 mg by mouth 2 (two) times daily as needed for anxiety., Disp: , Rfl:    cyanocobalamin (VITAMIN B12) 1000 MCG tablet, Take 1,000 mcg by mouth daily., Disp: , Rfl:    dicyclomine (BENTYL) 20 MG tablet, Take 20 mg by mouth 3 (three) times daily before meals., Disp: , Rfl:    famotidine-calcium carbonate-magnesium hydroxide (PEPCID COMPLETE)  [phone] MG chewable tablet, Chew 1 tablet by mouth daily as needed., Disp: , Rfl:    fluticasone (FLONASE) 50 MCG/ACT nasal spray, 1 spray in each nostril, Disp: , Rfl:    gabapentin (NEURONTIN) 300 MG capsule, Take 300 mg by mouth 2 (two) times daily., Disp: , Rfl:    levothyroxine (SYNTHROID) 88 MCG tablet, TAKE 1 TABLET(88 MCG) BY MOUTH DAILY BEFORE BREAKFAST, Disp: 90 tablet, Rfl: 3   metolazone (ZAROXOLYN) 2.5 MG tablet, Take 1 tablet by mouth every other Tuesday., Disp: 12 tablet, Rfl: 3   midodrine (PROAMATINE) 10 MG tablet, TAKE 1 TABLET THREE TIMES DAILY WITH MEALS, Disp: 270 tablet, Rfl: 3   Multiple Vitamin (MULTIVITAMIN WITH MINERALS) TABS tablet, Take 1 tablet by mouth daily., Disp: , Rfl:    potassium chloride SA (KLOR-CON M) 20 MEQ tablet, TAKE 2 TABLETS THREE TIMES DAILY. TAKE AN EXTRA 2 TABLETS ON DAYS YOU TAKE METOLAZONE, Disp: 572 tablet, Rfl: 0   simvastatin (  ZOCOR) 10 MG tablet, TAKE 1 TABLET EVERY EVENING, Disp: 90 tablet, Rfl: 1   spironolactone (ALDACTONE) 25 MG tablet, TAKE 1 TABLET (25 MG TOTAL) BY MOUTH EVERY EVENING., Disp: 90 tablet, Rfl: 0   torsemide (DEMADEX) 20 MG tablet, Take 4 tablets (80 mg total) by mouth every morning AND 3 tablets (60 mg total) every evening., Disp: 210 tablet, Rfl: 11   pantoprazole (PROTONIX) 40 MG tablet, Take 40 mg by mouth daily as needed (for acid reflux). (Patient not taking: Reported on 04/27/2022), Disp: , Rfl:  Allergies  Allergen Reactions   Ceftin Anaphylaxis    Face and throat swell    Geodon [Ziprasidone Hcl] Hives   Lisinopril Other (See Comments)    angioedema   Cefuroxime     Other reaction(s): anaphylaxis   Sulfacetamide Sodium-Sulfur     Other reaction(s): itch   Allopurinol Nausea Only and Other (See Comments)    weakness   Ativan [Lorazepam] Itching   Sulfa Antibiotics Itching   Valium [Diazepam] Other (See Comments)    Patient states that diazepam doesn't relax, it has the opposite effect.     Social  History   Socioeconomic History   Marital status: Married    Spouse name: Not on file   Number of children: 2   Years of education: 51   Highest education level: Master's degree (e.g., MA, MS, MEng, MEd, MSW, MBA)  Occupational History   Occupation: retired  Tobacco Use   Smoking status: Former    Packs/day: 0.10    Years: 26.00    Total pack years: 2.60    Types: Cigarettes    Quit date: 03/23/2021    Years since quitting: 1.2   Smokeless tobacco: Never  Vaping Use   Vaping Use: Never used  Substance and Sexual Activity   Alcohol use: No   Drug use: No   Sexual activity: Yes  Other Topics Concern   Not on file  Social History Narrative   Tobacco Use Cigarettes: Former Smoker, Quit in 2008   No Alcohol   No recreational drug use   Diet: Regular/Low Carb   Exercise: None   Occupation: disabled   Education: Research officer, political party, masters   Children: 2   Firearms: No   Therapist, art Use: 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      Social Determinants of Health   Financial Resource Strain: Not on file  Food Insecurity: Not on file  Transportation Needs: Not on file  Physical Activity: Not on file  Stress: Not on file  Social Connections: Not on file  Intimate Partner Violence: Not on file    Physical Exam      Future Appointments  Date Time Provider [place]  06/10/2022  7:15 AM [facility] DEVICE REMOTES [facility]  06/15/2022  3:30 PM Narda Amber K, DO [facility] None  06/30/2022  2:00 PM Marzetta Board, DPM [facility]  07/07/2022  8:30 AM Hazle Coca, PhD [facility] None  07/07/2022  9:30 AM [facility]- NEUROPSYCH TECH [facility] None  07/15/2022 10:00 AM Hazle Coca, PhD [facility] None  09/09/2022  7:15 AM [facility] DEVICE REMOTES [facility]  12/09/2022  7:15 AM [facility] DEVICE REMOTES [facility]

## 2022-06-10 DIAGNOSIS — E039 Hypothyroidism, unspecified: Secondary | ICD-10-CM | POA: Diagnosis not present

## 2022-06-10 DIAGNOSIS — R519 Headache, unspecified: Secondary | ICD-10-CM | POA: Diagnosis not present

## 2022-06-10 DIAGNOSIS — F39 Unspecified mood [affective] disorder: Secondary | ICD-10-CM | POA: Diagnosis not present

## 2022-06-10 DIAGNOSIS — Z79899 Other long term (current) drug therapy: Secondary | ICD-10-CM | POA: Diagnosis not present

## 2022-06-15 ENCOUNTER — Ambulatory Visit (INDEPENDENT_AMBULATORY_CARE_PROVIDER_SITE_OTHER): Payer: Medicare HMO

## 2022-06-15 ENCOUNTER — Encounter: Payer: Self-pay | Admitting: Neurology

## 2022-06-15 ENCOUNTER — Ambulatory Visit: Payer: Medicare HMO | Admitting: Neurology

## 2022-06-15 ENCOUNTER — Other Ambulatory Visit (HOSPITAL_COMMUNITY): Payer: Self-pay

## 2022-06-15 VITALS — BP 115/62 | HR 91 | Ht 63.0 in | Wt 193.0 lb

## 2022-06-15 DIAGNOSIS — E1140 Type 2 diabetes mellitus with diabetic neuropathy, unspecified: Secondary | ICD-10-CM | POA: Diagnosis not present

## 2022-06-15 DIAGNOSIS — G44229 Chronic tension-type headache, not intractable: Secondary | ICD-10-CM | POA: Diagnosis not present

## 2022-06-15 DIAGNOSIS — M542 Cervicalgia: Secondary | ICD-10-CM

## 2022-06-15 DIAGNOSIS — I428 Other cardiomyopathies: Secondary | ICD-10-CM

## 2022-06-15 DIAGNOSIS — I5022 Chronic systolic (congestive) heart failure: Secondary | ICD-10-CM

## 2022-06-15 MED ORDER — TIZANIDINE HCL 2 MG PO TABS: 2.0000 mg | ORAL_TABLET | Freq: Every evening | ORAL | 1 refills | Status: DC | PRN

## 2022-06-15 MED ORDER — GABAPENTIN 100 MG PO CAPS: 100.0000 mg | ORAL_CAPSULE | Freq: Two times a day (BID) | ORAL | 3 refills | Status: DC

## 2022-06-15 NOTE — Patient Instructions (Addendum)
Stop gabapentin '300mg'$  tablets.  Start gabapentin '100mg'$  twice daily  Start tizanidine '2mg'$  at bedtime for headaches  Start neck physiotherapy  Return to clinic in 1 year

## 2022-06-15 NOTE — Progress Notes (Signed)
No ICM remote transmission received for 06/09/2022 and next automatic ICM transmission scheduled for 07/08/2022.

## 2022-06-15 NOTE — Progress Notes (Signed)
Paramedicine Encounter    Patient ID: Rhonda [name], female    DOB: 1951/05/17, 72 y.o.   MRN: [id number]   Complaints-headache, fatigue   Assessment- ambulatory, CAOx4, no lower leg swelling, no chest pain, no shortness of breath, no dizziness.  Compliance with meds- no missed doses in last week.   Pill box filled- for one week   Refills needed- NONE   Meds changes since last visit- ADDED Tizanidine and Decreased Gabapentin after home visit today at [facility]     Social changes- NONE    BP 102/70   Pulse 97   Resp 16   Wt 189 lb 6.4 oz (85.9 kg)   SpO2 98%   BMI 33.55 kg/m  Weight yesterday-didn't weigh Last visit weight-192lbs  Weight at home today-189.4lbs   Arrived for home visit for Rhonda [name] who reports to be feeling well okay having headaches and fatigue. She will be [facility] by [facility] today. She states she is taking Tylenol often and it is not helping.   She missed no doses of meds over the last week.   Weight is down to 189lbs  No lower leg swelling. Lungs clear. Vitals as noted.  [employer] device didn't transmit due to battery issue- husband called [employer] and they are sending a new device monitor.   I reviewed meds and filled pill box for one week.   I reminded her of HF diet and restrictions and encouraged her to be sure she is weighing daily. She agreed.   Home visit complete.     Rhonda [name], [place]  ACTION: Home visit completed    Patient Care Team: [name], MD as PCP - General (Family Medicine) [name], LCSW as Social Worker (Licensed Clinical Social Worker) [name], [name], DO as Consulting Physician (Neurology) Rhonda [name], Paramedic as Paramedic [name] [name], MD as Attending Physician (Family Medicine)  Patient Active Problem List   Diagnosis Date Noted   Elevated troponin 12/02/2021   Nausea & vomiting 12/01/2021   Acute left ankle pain 10/11/2021    Intractable nausea and vomiting 10/08/2021   AKI (acute kidney injury) ([place]) 10/08/2021   Hyponatremia 10/08/2021   Decreased estrogen level 08/01/2021   Dementia ([place]) 08/01/2021   Frail elderly 08/01/2021   Hyperparathyroidism ([place]) 08/01/2021   Spondylolisthesis 08/01/2021   Varicose veins of bilateral lower extremities with other complications 93/23/5573   Lumbago without sciatica 04/30/2021   Abnormal gait 06/11/2020   Adult failure to thrive syndrome 06/11/2020   Allergic rhinitis 06/11/2020   Anxiety 06/11/2020   Asthma 06/11/2020   Benign intracranial hypertension 06/11/2020   Body mass index (BMI) 45.0-49.9, adult ([place]) 06/11/2020   Bowel incontinence 06/11/2020   Cholelithiasis 06/11/2020   Chronic sinusitis 06/11/2020   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   Daytime somnolence 06/11/2020   Diarrhea of presumed infectious origin 06/11/2020   Edema 06/11/2020   Family history of malignant neoplasm of gastrointestinal tract 06/11/2020   Gout 06/11/2020   History of fall 06/11/2020   History of infectious disease 06/11/2020   Insomnia 06/11/2020   Irregular bowel habits 06/11/2020   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   Lumbar spondylosis with myelopathy 06/11/2020   Macrocytosis 06/11/2020   Mild recurrent major depression ([place]) 06/11/2020   Personal history of colonic polyps 06/11/2020   Personal history of malignant neoplasm of breast 06/11/2020   Repeated falls 06/11/2020   Mixed anxiety and depressive disorder 06/11/2020   Irritable bowel syndrome 01/04/2020   Spinal stenosis of lumbar region  01/03/2020   Other symptoms and signs involving the musculoskeletal system 09/11/2019   Bilateral leg weakness 08/01/2019   Leg swelling 08/01/2019   Diabetes mellitus with neuropathy ([place]) 06/15/2019   Hypokalemia 11/08/2018   CKD (chronic kidney disease) stage 3, GFR 30-59 ml/min (HCC) 11/08/2018   Orthostatic hypotension 07/28/2017   SVD (spontaneous vaginal delivery)    Peripheral  neuropathy    On home oxygen therapy    Migraines    Left bundle branch block    Hypothyroidism    Hypertension    Hyperlipidemia    Heart murmur    GERD (gastroesophageal reflux disease)    Exertional shortness of breath    Major depressive disorder    Back pain    Arthritis    Generalized anxiety disorder    Anemia    Chronic respiratory failure ([place]) 09/14/2013   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   Morbid obesity ([place]) 14/78/2956   Chronic systolic heart failure ([place]) 10/27/2012   Endometrial polyp 01/20/2012   Malignant tumor of breast ([place]) 03/26/2011   Unspecified vitamin D deficiency 03/26/2011    Current Outpatient Medications:    acetaminophen (TYLENOL) 500 MG tablet, Take 1,000 mg by mouth daily as needed (for pain and fever of 101 or greater)., Disp: , Rfl:    acetaZOLAMIDE (DIAMOX) 125 MG tablet, Take 1 tablet (125 mg total) by mouth daily., Disp: 90 tablet, Rfl: 3   Apoaequorin (PREVAGEN EXTRA STRENGTH) 20 MG CAPS, Take 20 mg by mouth daily., Disp: , Rfl:    Ascorbic Acid (VITAMIN C) 1000 MG tablet, Take 1,000 mg by mouth daily., Disp: , Rfl:    buPROPion (WELLBUTRIN XL) 150 MG 24 hr tablet, Take 150 mg by mouth daily., Disp: , Rfl:    carvedilol (COREG) 3.125 MG tablet, TAKE 1 TABLET (3.125 MG) BY MOUTH TWICE DAILY WITH MEALS, Disp: 180 tablet, Rfl: 3   citalopram (CELEXA) 20 MG tablet, Take 20 mg by mouth daily., Disp: , Rfl:    clonazePAM (KLONOPIN) 0.5 MG tablet, Take 0.5 mg by mouth 2 (two) times daily as needed for anxiety., Disp: , Rfl:    cyanocobalamin (VITAMIN B12) 1000 MCG tablet, Take 1,000 mcg by mouth daily., Disp: , Rfl:    dicyclomine (BENTYL) 20 MG tablet, Take 20 mg by mouth 3 (three) times daily before meals., Disp: , Rfl:    famotidine-calcium carbonate-magnesium hydroxide (PEPCID COMPLETE) 10-800-165 MG chewable tablet, Chew 1 tablet by mouth daily as needed., Disp: , Rfl:    fluticasone (FLONASE) 50  MCG/ACT nasal spray, 1 spray in each nostril, Disp: , Rfl:    gabapentin (NEURONTIN) 100 MG capsule, Take 1 capsule (100 mg total) by mouth 2 (two) times daily., Disp: 180 capsule, Rfl: 3   levothyroxine (SYNTHROID) 88 MCG tablet, TAKE 1 TABLET(88 MCG) BY MOUTH DAILY BEFORE BREAKFAST, Disp: 90 tablet, Rfl: 3   metolazone (ZAROXOLYN) 2.5 MG tablet, Take 1 tablet by mouth every other Tuesday., Disp: 12 tablet, Rfl: 3   midodrine (PROAMATINE) 10 MG tablet, TAKE 1 TABLET THREE TIMES DAILY WITH MEALS, Disp: 270 tablet, Rfl: 3   Multiple Vitamin (MULTIVITAMIN WITH MINERALS) TABS tablet, Take 1 tablet by mouth daily., Disp: , Rfl:    pantoprazole (PROTONIX) 40 MG tablet, Take 40 mg by mouth daily as needed (for acid reflux)., Disp: , Rfl:    potassium chloride SA (KLOR-CON M) 20 MEQ tablet, TAKE 2 TABLETS THREE TIMES DAILY. TAKE AN EXTRA 2 TABLETS ON DAYS YOU TAKE METOLAZONE,  Disp: 572 tablet, Rfl: 0   simvastatin (ZOCOR) 10 MG tablet, TAKE 1 TABLET EVERY EVENING, Disp: 90 tablet, Rfl: 1   spironolactone (ALDACTONE) 25 MG tablet, TAKE 1 TABLET (25 MG TOTAL) BY MOUTH EVERY EVENING., Disp: 90 tablet, Rfl: 0   tiZANidine (ZANAFLEX) 2 MG tablet, Take 1 tablet (2 mg total) by mouth at bedtime as needed for muscle spasms., Disp: 90 tablet, Rfl: 1   torsemide (DEMADEX) 20 MG tablet, Take 4 tablets (80 mg total) by mouth every morning AND 3 tablets (60 mg total) every evening., Disp: 210 tablet, Rfl: 11 Allergies  Allergen Reactions   Ceftin Anaphylaxis    Face and throat swell    Geodon [Ziprasidone Hcl] Hives   Lisinopril Other (See Comments)    angioedema   Cefuroxime     Other reaction(s): anaphylaxis   Sulfacetamide Sodium-Sulfur     Other reaction(s): itch   Allopurinol Nausea Only and Other (See Comments)    weakness   Ativan [Lorazepam] Itching   Sulfa Antibiotics Itching   Valium [Diazepam] Other (See Comments)    Patient states that diazepam doesn't relax, it has the opposite effect.      Social History   Socioeconomic History   Marital status: Married    Spouse name: Not on file   Number of children: 2   Years of education: 71   Highest education level: Master's degree (e.g., MA, MS, MEng, MEd, MSW, MBA)  Occupational History   Occupation: retired  Tobacco Use   Smoking status: Former    Packs/day: 0.10    Years: 26.00    Total pack years: 2.60    Types: Cigarettes    Quit date: 03/23/2021    Years since quitting: 1.2   Smokeless tobacco: Never  Vaping Use   Vaping Use: Never used  Substance and Sexual Activity   Alcohol use: No   Drug use: No   Sexual activity: Yes  Other Topics Concern   Not on file  Social History Narrative   Tobacco Use Cigarettes: Former Smoker, Quit in 2008   No Alcohol   No recreational drug use   Diet: Regular/Low Carb   Exercise: None   Occupation: disabled   Education: Research officer, political party, masters   Children: 2   Firearms: No   Therapist, art Use: 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      Social Determinants of Health   Financial Resource Strain: Not on file  Food Insecurity: Not on file  Transportation Needs: Not on file  Physical Activity: Not on file  Stress: Not on file  Social Connections: Not on file  Intimate Partner Violence: Not on file    Physical Exam      Future Appointments  Date Time Provider [place]  06/30/2022  2:00 PM Marzetta Board, [place] [facility]  07/07/2022  8:30 AM Hazle Coca, PhD [facility] None  07/07/2022  9:30 AM [facility]- NEUROPSYCH TECH [facility] None  07/08/2022  7:00 AM [facility] DEVICE REMOTES [facility]  07/15/2022 10:00 AM Hazle Coca, PhD [facility] None  09/09/2022  7:15 AM [facility] DEVICE REMOTES [facility]  12/09/2022  7:15 AM [facility] DEVICE REMOTES [facility]  06/29/2023  1:30 PM Patel, Arvin Collard K, DO [facility] None

## 2022-06-15 NOTE — Progress Notes (Signed)
Follow-up Visit   Date: 06/15/22    KARLI WICKIZER MRN: [id number] DOB: 09-05-1950   Interim History: Rhonda [name] is a 72 y.o.  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  The patient was accompanied to the clinic by husband who also provides collateral information.    History of present illness: She reports having falls since November 2017, some which occurred by tripping over objects and loosing her balance.  Falls become much worse during 2018.  She often gets lightheaded preceding her falls and nearly always falls backwards.  Many of her falls were occurring in the kitchen while standing as she was preparing dinner.  She noticed that prolonged standing for 5 to 10 minutes always triggered her falls and was preceded by lightheadedness.  She was found to be orthostatic and started on midodrine 10 mg 3 times daily.   She has been diabetic for at least 10 years and has painful neuropathy of the feet, which is well-controlled on gabapentin '300mg'$  BID.  She takes extra gabapentin '800mg'$  as needed for severe pain, about once per week.    Over the past several years, she has noticed problems with short-term memory and family get frustrated with her because she is "slow".  She is able to cook and do laundry.  She manages her own medications and drives, but sometimes feels uncomfortable when driving. On one occasion, she forgot how to put her cars into her ignition or to start the wipers.  She is driving and has not been involved in any MVAs.  Her family complained that she is repeating herself often and sometimes she talks nonsensically.  She manages finances with her husband.  Prior work-up has included: CT cervical spine, CT brain.   Imaging showed increased sclerosis of the calvarium and vertebral bodies.  Her PTH was elevated and she is seeing endocrinology.  Findings also showed possible intracranial hypertension and follow-up LP showed mildly raised ICP at 30m H20.  There has been no change in her gait or falls following the spinal tap.  She was started on Diamox '125mg'$  daily.  For memory issues, she underwent neurocognitive testing, however, due to patient being very tired/sleepy, she was unable to engage in the testing, limiting formal diagnosis. However, there was evidence of severe depression and anxiety, which patient endorses.     UPDATE 02/29/20:  She is here for follow-up visit.  The frequency of her falls have reduced and her last fall was about 3 months ago. She is not sure what has helped. She continues to be forgetful, no worse than before.  Prior testing shows changes related to severe depression and anxiety.  She is followed by Dr. PCasimiro Needle  Her bigger issue is problems with her legs and gait which led to [facility].  CT myelogram shows severe spinal stenosis at L4-5 and surgery has been offered.  They are undecided on this.   UPDATE 02/27/2021:  She is accompanied by her husband for follow-up visit. She has been doing relatively stable over the past year. Her falls have significantly decreased. She had a few trips over the summer, but these were mechanical.  Her biggest concern remains her memory.  She is unable to keep up with conversations or complex tasks.  She tends to forget to take her medication.  Her neuropsychological testing from October 2020 showed cognitive changes due to severe depression and anxiety, however, testing was also hampered by patient being very sleepy.  She would like to have this again, because of concern of dementia.  Her neuropathy is well-controlled on gabapentin '300mg'$  three times daily. She rarely takes an extra '800mg'$  tablet.   UPDATE 06/15/2022:   She complains of daily throbbing headache which lasts all day, worse at the vertex.  She is takes tylenol daily.  No specific triggers.  She continues to have problems with memory and keeping up with day-to day tasks.  Prior neuropsychiatric testing shows cognitive impairment due to severe depression and anxiety.  She continues to have severe depression and has been provided with a list of counselors, but has not contacted anyone.   She admits to have low mood.  She has CHF and worsening renal function which is being monitored.     Medications:  Current Outpatient Medications on File Prior to Visit  Medication Sig Dispense Refill   acetaminophen (TYLENOL) 500 MG tablet Take 1,000 mg by mouth daily as needed (for pain and fever of 101 or greater).     acetaZOLAMIDE (DIAMOX) 125 MG tablet Take 1 tablet (125 mg total) by mouth daily. 90 tablet 3   Apoaequorin (PREVAGEN EXTRA STRENGTH) 20 MG CAPS Take 20 mg by mouth daily.     Ascorbic Acid (VITAMIN C) 1000 MG tablet Take 1,000 mg by mouth daily.     buPROPion (WELLBUTRIN XL) 150 MG 24 hr tablet Take 150 mg by mouth daily.     carvedilol (COREG) 3.125 MG tablet TAKE 1 TABLET (3.125 MG) BY MOUTH TWICE DAILY WITH MEALS 180 tablet 3   citalopram (CELEXA) 20 MG tablet Take 20 mg by mouth daily.     clonazePAM (KLONOPIN) 0.5 MG tablet Take 0.5 mg by mouth 2 (two) times daily as needed for anxiety.     cyanocobalamin (VITAMIN B12) 1000 MCG tablet Take 1,000 mcg by mouth daily.     dicyclomine (BENTYL) 20 MG tablet Take 20 mg by mouth 3 (three) times daily before meals.     famotidine-calcium carbonate-magnesium hydroxide (PEPCID COMPLETE) 10-800-165 MG chewable tablet Chew 1 tablet by mouth daily as needed.     fluticasone (FLONASE) 50 MCG/ACT nasal spray 1 spray in each nostril     gabapentin (NEURONTIN) 300 MG capsule Take 300 mg by mouth 2 (two) times daily.     levothyroxine (SYNTHROID) 88 MCG tablet TAKE 1 TABLET(88 MCG) BY MOUTH DAILY BEFORE  BREAKFAST 90 tablet 3   metolazone (ZAROXOLYN) 2.5 MG tablet Take 1 tablet by mouth every other Tuesday. 12 tablet 3   midodrine (PROAMATINE) 10 MG tablet TAKE 1 TABLET THREE TIMES DAILY WITH MEALS 270 tablet 3   Multiple Vitamin (MULTIVITAMIN WITH MINERALS) TABS tablet Take 1 tablet by mouth daily.     pantoprazole (PROTONIX) 40 MG tablet Take 40 mg by mouth daily as needed (for acid reflux).     potassium chloride SA (KLOR-CON M) 20 MEQ tablet TAKE 2 TABLETS THREE TIMES DAILY. TAKE AN EXTRA 2 TABLETS ON DAYS YOU TAKE METOLAZONE 572 tablet 0   simvastatin (ZOCOR) 10 MG tablet TAKE 1 TABLET EVERY EVENING 90 tablet 1   spironolactone (ALDACTONE) 25 MG tablet TAKE 1 TABLET (25 MG TOTAL) BY MOUTH EVERY EVENING. 90 tablet 0   torsemide (DEMADEX) 20 MG tablet Take 4 tablets (80 mg total) by mouth every  morning AND 3 tablets (60 mg total) every evening. 210 tablet 11   No current facility-administered medications on file prior to visit.    Allergies:  Allergies  Allergen Reactions   Ceftin Anaphylaxis    Face and throat swell    Geodon [Ziprasidone Hcl] Hives   Lisinopril Other (See Comments)    angioedema   Cefuroxime     Other reaction(s): anaphylaxis   Sulfacetamide Sodium-Sulfur     Other reaction(s): itch   Allopurinol Nausea Only and Other (See Comments)    weakness   Ativan [Lorazepam] Itching   Sulfa Antibiotics Itching   Valium [Diazepam] Other (See Comments)    Patient states that diazepam doesn't relax, it has the opposite effect.    Vital Signs:  BP 115/62   Pulse 91   Ht '5\' 3"'$  (1.6 m)   Wt 193 lb (87.5 kg)   SpO2 97%   BMI 34.19 kg/m   No data found.  Neurological Exam: MENTAL STATUS including orientation to time, place, person, recent and remote memory is attention span and concentration, language, and fund of knowledge is fair.  Year is 2023, month is January (correct), does not know the day of the week.  Follows commands and answers questions easily.  Speech is  slow and dysarthric (baseline).  CRANIAL NERVES:  Pupils are round and reactive. Extraocular muscles are intact throughout, except mild restriction with upgaze.  No ptosis.   MOTOR:  Motor strength is 5/5 in all extremities. No pronator drift.  Tone is normal.  No tremor.   REFLEXES:  Reflexes are 2+/4 throughout except 3+/4 at the knees bilaterally.   COORDINATION/GAIT:  Gait is slightly-wide based, unassisted.   Data: Labs 07/31/2016:  Vitamin B1 11, vitamin B12 597, copper 162, SPEP with IFE no M protein  CT head wo contrast performed at [facility] 11/24/2017: No acute intracranial abnormality appreciated.  Expanded empty sella may reflect idiopathic intracranial hypertension.  Thick dense calvarium which is of uncertain clinical significance.  Boney sclerosis can be seen with, but not limited to, hematological conditions such as myelofibrosis, metabolic bone disorders such as hyperthyroidism and hyperparathyroidism and other causes.  Correlate clinically.   CT cervical spine wo contrast 11/24/2017: 1.  No acute fracture 2.  No significant spinal canal or foraminal stenosis 3.  Increased sclerosis of the bony structures.  Boney sclerosis can be seen with, but not limited to, hematological conditions such as myelofibrosis, metabolic bone disorders such as hyperthyroidism and hyperparathyroidism and other causes.  Correlate clinically.   Neuropsychological testing 03/15/2019:  severe symptoms of both depression and anxiety.   IMPRESSION/PLAN: 1.  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 - Emphasized the importance to follow-through with seeking appropriate psychiatric and psychological care    - PCP has provided her with a list of therapist but she has not contacted anyone  2.  Chronic tension headaches - Start tizanidine '2mg'$  daily - Refer for out-patient neck PT  3.  Falls due to orthostatic hypotension, improved and stable.   - Continue midodrine '10mg'$  TID  prescribed by cardiology   3.  Intracranial hypertension OP 26cm H20. Stable, no visual complaints or headaches  - Continue Diamox '125mg'$  daily  4.  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  Stable - renally dose gabapentin '100mg'$  twice daily  5.  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    - Previously seen Dr. Marcello Moores, she is not interested in surgery  Return to clinic in 1 year  Thank you for allowing me to participate in patient's care.  If I can answer any additional questions, I would be pleased to do so.    Sincerely,

## 2022-06-16 ENCOUNTER — Telehealth: Payer: Self-pay

## 2022-06-16 ENCOUNTER — Other Ambulatory Visit (HOSPITAL_COMMUNITY): Payer: Self-pay

## 2022-06-16 LAB — CUP PACEART REMOTE DEVICE CHECK
Battery Remaining Longevity: 65 mo
Battery Voltage: 2.98 V
Brady Statistic AP VP Percent: 0.08 %
Brady Statistic AP VS Percent: 0.01 %
Brady Statistic AS VP Percent: 98.66 %
Brady Statistic AS VS Percent: 1.24 %
Brady Statistic RA Percent Paced: 0.09 %
Brady Statistic RV Percent Paced: 94.34 %
Date Time Interrogation Session: 20240122173816
HighPow Impedance: 70 Ohm
Implantable Lead Connection Status: 753985
Implantable Lead Connection Status: 753985
Implantable Lead Connection Status: 753985
Implantable Lead Implant Date: 20141210
Implantable Lead Implant Date: 20141210
Implantable Lead Implant Date: 20141210
Implantable Lead Location: 753858
Implantable Lead Location: 753859
Implantable Lead Location: 753860
Implantable Lead Model: 4396
Implantable Lead Model: 5076
Implantable Lead Model: 6935
Implantable Pulse Generator Implant Date: 20211118
Lead Channel Impedance Value: 342 Ohm
Lead Channel Impedance Value: 456 Ohm
Lead Channel Impedance Value: 456 Ohm
Lead Channel Impedance Value: 532 Ohm
Lead Channel Impedance Value: 646 Ohm
Lead Channel Impedance Value: 703 Ohm
Lead Channel Pacing Threshold Amplitude: 0.5 V
Lead Channel Pacing Threshold Amplitude: 0.875 V
Lead Channel Pacing Threshold Amplitude: 1.25 V
Lead Channel Pacing Threshold Pulse Width: 0.4 ms
Lead Channel Pacing Threshold Pulse Width: 0.4 ms
Lead Channel Pacing Threshold Pulse Width: 0.4 ms
Lead Channel Sensing Intrinsic Amplitude: 26.625 mV
Lead Channel Sensing Intrinsic Amplitude: 26.625 mV
Lead Channel Sensing Intrinsic Amplitude: 3.25 mV
Lead Channel Sensing Intrinsic Amplitude: 3.25 mV
Lead Channel Setting Pacing Amplitude: 1.5 V
Lead Channel Setting Pacing Amplitude: 2 V
Lead Channel Setting Pacing Amplitude: 2.5 V
Lead Channel Setting Pacing Pulse Width: 0.4 ms
Lead Channel Setting Pacing Pulse Width: 0.4 ms
Lead Channel Setting Sensing Sensitivity: 0.3 mV
Zone Setting Status: 755011
Zone Setting Status: 755011

## 2022-06-16 NOTE — Progress Notes (Signed)
Paramedicine Encounter   Martin Majestic out today to assist with a medication review and adjustment in pill box.   She was seen by Neuro yesterday who decreased Gabapentin to '100mg'$  BID and added Tizanidine at bedtime.   I reviewed changes and corrected her pill box for the week.   I also discussed with her and her husband about device clinic findings of her having some fluid retention noted on transmission. I explained the importance of med compliance, and diet compliance and reviewed heart healthy diet and fluid recommendations and suggestions.   Her and her husband both verbalized understanding. I will follow up in one week.   [name], EMT-Paramedic [phone] 06/16/2022   ACTION: Home visit completed

## 2022-06-16 NOTE — Telephone Encounter (Signed)
Spoke with [name], EMT Parm[facility] program. She stated [employer] is sending a new monitor hand piece since hers will not charge.  She should receive in about 10 days. Advised will recheck fluid levels on 1/31.  She confirmed patient's metolazone dosage is due next week.  [name] reports pt continues to eat foods high in salt despite numerous discussions with the patient and husband about the importance of low salt diet.

## 2022-06-19 ENCOUNTER — Telehealth: Payer: Self-pay | Admitting: *Deleted

## 2022-06-19 NOTE — Patient Outreach (Signed)
  Care Coordination   Initial Visit Note   06/19/2022 Name: Rhonda [name] MRN: [id number] DOB: [birth year]  Rhonda [name] is a 72 y.o. year old female who sees Rhonda [name], Rhonda Pall, MD for primary care. I  spoke with the spouse  Rhonda [name] today.  What matters to the patients health and wellness today?  Establish a psychiatric    Goals Addressed             This Visit's Progress    Establish a provider (psychiatric/psychological)       Care Coordination Interventions: Collaborated with Dr. Lindell [name] (spoke w/ Rhonda [name]) regarding caregiver's  request for PCP to resend list as recommended therapist for a psychiatric and psychological for further examination.  Reviewed scheduled/upcoming provider appointments including sufficient transportation source Assessed social determinant of health barriers Educated on care management services with the requested task as noted above          SDOH assessments and interventions completed:  Yes  SDOH Interventions Today    Flowsheet Row Most Recent Value  SDOH Interventions   Food Insecurity Interventions Intervention Not Indicated  Housing Interventions Intervention Not Indicated  Transportation Interventions Intervention Not Indicated  Utilities Interventions Intervention Not Indicated        Care Coordination Interventions:  Yes, provided   Follow up plan: Follow up call scheduled for 07/10/2022 @ 2:00 pm    Encounter Outcome:  Pt. Visit Completed   Rhonda Mina, RN Care Management Coordinator [place] Office [phone]

## 2022-06-19 NOTE — Patient Instructions (Signed)
Visit Information  Thank you for taking time to visit with me today. Please don't hesitate to contact me if I can be of assistance to you.   Following are the goals we discussed today:   Goals Addressed             This Visit's Progress    Establish a provider (psychiatric/psychological)       Care Coordination Interventions: Collaborated with Dr. [name] (spoke w/ Manuela Schwartz) regarding caregiver's  request for PCP to resend list as recommended therapist for a psychiatric and psychological for further examination.  Reviewed scheduled/upcoming provider appointments including sufficient transportation source Assessed social determinant of health barriers Educated on care management services with the requested task as noted above          Our next appointment is by telephone on 07/10/2022 at 2:00 pm  Please call the care guide team at [phone] if you need to cancel or reschedule your appointment.   If you are experiencing a Mental Health or [place] or need someone to talk to, please call the Suicide and Crisis Lifeline: 988  The patient verbalized understanding of instructions, educational materials, and care plan provided today and DECLINED offer to receive copy of patient instructions, educational materials, and care plan.   Raina Mina, RN Care Management Coordinator [place] Office [phone]

## 2022-06-20 ENCOUNTER — Other Ambulatory Visit (HOSPITAL_COMMUNITY): Payer: Self-pay | Admitting: Internal Medicine

## 2022-06-22 ENCOUNTER — Other Ambulatory Visit (HOSPITAL_COMMUNITY): Payer: Self-pay

## 2022-06-22 ENCOUNTER — Telehealth (HOSPITAL_COMMUNITY): Payer: Self-pay

## 2022-06-22 NOTE — Progress Notes (Unsigned)
Paramedicine Encounter    Patient ID: Rhonda [name], female    DOB: [birth year], 72 y.o.   MRN: [id number]  Met with Rhonda [name] today for a med review and education visit I spoke to Rhonda [name] and they are both concerned with gabapentin causing a "jittery" feeling and or nervousness as well as increased fatigue, and decreased appetite. When I explained she was on a higher dose before and it could take some time to adjust they understood but, Mr. Don requested I send a message to Dr. [name] in regards to their concern to see if the medication should be stopped all together or if there was any of her other medications causing this. I will forward to seek further advice and see how I can assist.    She agreed to continue taking same until told otherwise, I reviewed meds and confirmed same filling pill box for one week. I stressed the importance of her not messing with the pill box to switch medications or add or take away anything. In her pill box I found two missed doses of medications over the last week.   I advised her how important it is for her to be taking her medications every day three times daily and how missing these doses can affect many different things in her health. She verbalized understanding.   I reviewed appointments with her and confirmed same writing them down for her and taping them to the wall.   She knows to reach out if she needs anything further. I will see her in one week. Home visit complete.   [name], EMT-Paramedic [phone] 06/23/2022    ACTION: Home visit completed

## 2022-06-22 NOTE — Telephone Encounter (Signed)
Went out for home paramedicine visit today and spoke to Mr. & Mrs. Felix and they are both concerned with gabapentin causing a "jittery" feeling and or nervousness as well as increased fatigue, and decreased appetite. When I explained she was on a higher dose before and it could take some time to adjust they understood but, Mr. Danser requested I send a message to Dr. [name] in regards to their concern to see if the medication should be stopped all together or if there was any of her other medications causing this. I will forward to seek further advice and see how I can assist.   [name], EMT-Paramedic [facility]  [phone] 06/22/2022

## 2022-06-23 NOTE — Telephone Encounter (Signed)
I recommend that she continue gabapentin '100mg'$  twice daily and allow her body to adjust to this new dose.  If after two weeks of being on this lower dose, she continues to have symptoms, we can stop it.  Thank you.

## 2022-06-24 ENCOUNTER — Ambulatory Visit: Payer: Medicare HMO | Admitting: Physical Therapy

## 2022-06-24 ENCOUNTER — Ambulatory Visit (INDEPENDENT_AMBULATORY_CARE_PROVIDER_SITE_OTHER): Payer: Medicare HMO

## 2022-06-24 DIAGNOSIS — I5022 Chronic systolic (congestive) heart failure: Secondary | ICD-10-CM

## 2022-06-24 DIAGNOSIS — F419 Anxiety disorder, unspecified: Secondary | ICD-10-CM | POA: Diagnosis not present

## 2022-06-24 DIAGNOSIS — Z79899 Other long term (current) drug therapy: Secondary | ICD-10-CM | POA: Diagnosis not present

## 2022-06-24 DIAGNOSIS — Z95810 Presence of automatic (implantable) cardiac defibrillator: Secondary | ICD-10-CM

## 2022-06-24 DIAGNOSIS — R4589 Other symptoms and signs involving emotional state: Secondary | ICD-10-CM | POA: Diagnosis not present

## 2022-06-24 DIAGNOSIS — R519 Headache, unspecified: Secondary | ICD-10-CM | POA: Diagnosis not present

## 2022-06-24 DIAGNOSIS — R110 Nausea: Secondary | ICD-10-CM | POA: Diagnosis not present

## 2022-06-25 ENCOUNTER — Emergency Department (HOSPITAL_BASED_OUTPATIENT_CLINIC_OR_DEPARTMENT_OTHER)
Admission: EM | Admit: 2022-06-25 | Discharge: 2022-06-25 | Disposition: A | Discharge status: Home / Self Care | Payer: Medicare HMO | Attending: Emergency Medicine | Admitting: Emergency Medicine

## 2022-06-25 ENCOUNTER — Encounter (HOSPITAL_BASED_OUTPATIENT_CLINIC_OR_DEPARTMENT_OTHER): Payer: Self-pay

## 2022-06-25 ENCOUNTER — Emergency Department (HOSPITAL_BASED_OUTPATIENT_CLINIC_OR_DEPARTMENT_OTHER): Payer: Medicare HMO | Admitting: Radiology

## 2022-06-25 ENCOUNTER — Telehealth: Payer: Self-pay | Admitting: *Deleted

## 2022-06-25 ENCOUNTER — Other Ambulatory Visit: Payer: Self-pay

## 2022-06-25 DIAGNOSIS — N189 Chronic kidney disease, unspecified: Secondary | ICD-10-CM | POA: Insufficient documentation

## 2022-06-25 DIAGNOSIS — N1832 Chronic kidney disease, stage 3b: Secondary | ICD-10-CM | POA: Diagnosis not present

## 2022-06-25 DIAGNOSIS — I509 Heart failure, unspecified: Secondary | ICD-10-CM | POA: Diagnosis not present

## 2022-06-25 DIAGNOSIS — M1000 Idiopathic gout, unspecified site: Secondary | ICD-10-CM | POA: Diagnosis not present

## 2022-06-25 DIAGNOSIS — Z853 Personal history of malignant neoplasm of breast: Secondary | ICD-10-CM | POA: Insufficient documentation

## 2022-06-25 DIAGNOSIS — R55 Syncope and collapse: Secondary | ICD-10-CM

## 2022-06-25 DIAGNOSIS — I951 Orthostatic hypotension: Secondary | ICD-10-CM | POA: Diagnosis not present

## 2022-06-25 DIAGNOSIS — I5022 Chronic systolic (congestive) heart failure: Secondary | ICD-10-CM | POA: Diagnosis not present

## 2022-06-25 DIAGNOSIS — I130 Hypertensive heart and chronic kidney disease with heart failure and stage 1 through stage 4 chronic kidney disease, or unspecified chronic kidney disease: Secondary | ICD-10-CM | POA: Diagnosis not present

## 2022-06-25 DIAGNOSIS — Z79899 Other long term (current) drug therapy: Secondary | ICD-10-CM | POA: Diagnosis not present

## 2022-06-25 DIAGNOSIS — R0602 Shortness of breath: Secondary | ICD-10-CM | POA: Diagnosis not present

## 2022-06-25 LAB — HEPATIC FUNCTION PANEL
ALT: 20 U/L (ref 0–44)
AST: 27 U/L (ref 15–41)
Albumin: 4.3 g/dL (ref 3.5–5.0)
Alkaline Phosphatase: 139 U/L — ABNORMAL HIGH (ref 38–126)
Bilirubin, Direct: 0.1 mg/dL (ref 0.0–0.2)
Indirect Bilirubin: 0.5 mg/dL (ref 0.3–0.9)
Total Bilirubin: 0.6 mg/dL (ref 0.3–1.2)
Total Protein: 8.7 g/dL — ABNORMAL HIGH (ref 6.5–8.1)

## 2022-06-25 LAB — BASIC METABOLIC PANEL
Anion gap: 15 (ref 5–15)
BUN: 29 mg/dL — ABNORMAL HIGH (ref 8–23)
CO2: 24 mmol/L (ref 22–32)
Calcium: 9.9 mg/dL (ref 8.9–10.3)
Chloride: 95 mmol/L — ABNORMAL LOW (ref 98–111)
Creatinine, Ser: 2.19 mg/dL — ABNORMAL HIGH (ref 0.44–1.00)
GFR, Estimated: 24 mL/min — ABNORMAL LOW (ref >60)
Glucose, Bld: 134 mg/dL — ABNORMAL HIGH (ref 70–99)
Potassium: 3.5 mmol/L (ref 3.5–5.1)
Sodium: 134 mmol/L — ABNORMAL LOW (ref 135–145)

## 2022-06-25 LAB — CBC
HCT: 41.1 % (ref 36.0–46.0)
Hemoglobin: 14 g/dL (ref 12.0–15.0)
MCH: 34.9 pg — ABNORMAL HIGH (ref 26.0–34.0)
MCHC: 34.1 g/dL (ref 30.0–36.0)
MCV: 102.5 fL — ABNORMAL HIGH (ref 80.0–100.0)
Platelets: 300 10*3/uL (ref 150–400)
RBC: 4.01 MIL/uL (ref 3.87–5.11)
RDW: 13.7 % (ref 11.5–15.5)
WBC: 7.9 10*3/uL (ref 4.0–10.5)
nRBC: 0 % (ref 0.0–0.2)

## 2022-06-25 LAB — TROPONIN I (HIGH SENSITIVITY)
Troponin I (High Sensitivity): 25 ng/L — ABNORMAL HIGH (ref <18)
Troponin I (High Sensitivity): 21 ng/L — ABNORMAL HIGH (ref <18)

## 2022-06-25 LAB — BRAIN NATRIURETIC PEPTIDE: B Natriuretic Peptide: 35.5 pg/mL (ref 0.0–100.0)

## 2022-06-25 MED ORDER — ONDANSETRON HCL 4 MG PO TABS: 4.0000 mg | ORAL_TABLET | Freq: Four times a day (QID) | ORAL | 0 refills | Status: DC

## 2022-06-25 MED ORDER — SODIUM CHLORIDE 0.9 % IV BOLUS
500.0000 mL | Freq: Once | INTRAVENOUS | Status: AC
  Administered 2022-06-25: 500 mL via INTRAVENOUS

## 2022-06-25 NOTE — ED Notes (Signed)
Pt verbalized understanding of d/c instructions, meds, and followup care. Denies questions. VSS, no distress noted. Steady gait to exit with all belongings.  ?

## 2022-06-25 NOTE — Discharge Instructions (Addendum)
Your workup today was reassuring other than your kidney function was mildly increased from recent baseline, please try to eat and drink here appropriate amount of fluids, do not over hydrate or eat too much with high salt content as this is likely to worsen your heart failure, if you are struggling to keep anything on your stomach I recommend using nausea medication I prescribed as needed, you can always try nutritional supplements such as Ensure.  Please follow-up with your primary care doctor to to follow-up and make sure that your symptoms are improving.

## 2022-06-25 NOTE — ED Provider Notes (Signed)
[facility] Provider Note   CSN: [id number] Arrival date & time: 06/25/22  1407     History  Chief Complaint  Patient presents with   Near Syncope    Rhonda [name] is a 72 y.o. female with past medical history significant for chronic systolic heart failure, left bundle branch block, hypertension, CKD, obesity, anxiety, depression, previous breast cancer, status post treatment who presents with concern for syncopal episode versus near syncopal episode, and shortness of breath, as well as low blood pressure.  Patient reports that she had not eaten much yesterday because she was nauseous.  She has been normotensive today, but was advised to come to the emergency department by physician at the kidney clinic secondary to her symptoms.  She denies any prodrome prior to her syncope, chest pain, feeling of dizziness.  She recalls that she has had similar episodes previously.  She is not feeling lightheaded today.   Near Syncope       Home Medications Prior to Admission medications   Medication Sig Start Date End Date Taking? Authorizing Provider  ondansetron (ZOFRAN) 4 MG tablet Take 1 tablet (4 mg total) by mouth every 6 (six) hours. 06/25/22  Yes Ellora Varnum H, PA-C  acetaminophen (TYLENOL) 500 MG tablet Take 1,000 mg by mouth daily as needed (for pain and fever of 101 or greater).    [provider]  acetaZOLAMIDE (DIAMOX) 125 MG tablet Take 1 tablet (125 mg total) by mouth daily. 02/27/21   Patel, Donika K, DO  Apoaequorin (PREVAGEN EXTRA STRENGTH) 20 MG CAPS Take 20 mg by mouth daily.    [provider]  Ascorbic Acid (VITAMIN C) 1000 MG tablet Take 1,000 mg by mouth daily.    [provider]  buPROPion (WELLBUTRIN XL) 150 MG 24 hr tablet Take 150 mg by mouth daily.    [provider]  carvedilol (COREG) 3.125 MG tablet TAKE 1 TABLET (3.125 MG) BY MOUTH TWICE DAILY WITH MEALS 10/01/21   Joette Catching, PA-C  citalopram (CELEXA) 20 MG tablet Take 20 mg by mouth daily.    [provider]  clonazePAM (KLONOPIN) 0.5 MG tablet Take 0.5 mg by mouth 2 (two) times daily as needed for anxiety. 10/15/21   [provider]  cyanocobalamin (VITAMIN B12) 1000 MCG tablet Take 1,000 mcg by mouth daily.    [provider]  dicyclomine (BENTYL) 20 MG tablet Take 20 mg by mouth 3 (three) times daily before meals.    [provider]  famotidine-calcium carbonate-magnesium hydroxide (PEPCID COMPLETE) 10-800-165 MG chewable tablet Chew 1 tablet by mouth daily as needed.    [provider]  fluticasone (FLONASE) 50 MCG/ACT nasal spray 1 spray in each nostril    [provider]  gabapentin (NEURONTIN) 100 MG capsule Take 1 capsule (100 mg total) by mouth 2 (two) times daily. 06/15/22   Narda Amber K, DO  levothyroxine (SYNTHROID) 88 MCG tablet TAKE 1 TABLET(88 MCG) BY MOUTH DAILY BEFORE BREAKFAST 07/08/20   Philemon Kingdom, MD  metolazone (ZAROXOLYN) 2.5 MG tablet Take 1 tablet by mouth every other Tuesday. 10/01/21   Joette Catching, PA-C  midodrine (PROAMATINE) 10 MG tablet TAKE 1 TABLET THREE TIMES DAILY WITH MEALS 06/22/22   Joette Catching, PA-C  Multiple Vitamin (MULTIVITAMIN WITH MINERALS) TABS tablet Take 1 tablet by mouth daily.    [provider]  pantoprazole (PROTONIX) 40 MG tablet Take 40 mg by mouth daily as needed (  for acid reflux). 10/13/21   [provider]  potassium chloride SA (KLOR-CON M) 20 MEQ tablet TAKE 2 TABLETS THREE TIMES DAILY. TAKE AN EXTRA 2 TABLETS ON DAYS YOU TAKE METOLAZONE 01/14/22   Joette Catching, PA-C  simvastatin (ZOCOR) 10 MG tablet TAKE 1 TABLET EVERY EVENING 03/30/22   Joette Catching, PA-C  spironolactone (ALDACTONE) 25 MG tablet TAKE 1 TABLET (25 MG TOTAL) BY MOUTH EVERY EVENING. 04/13/22   Bensimhon, Shaune Pascal, MD  tiZANidine (ZANAFLEX) 2 MG tablet Take 1 tablet (2 mg total) by  mouth at bedtime as needed for muscle spasms. 06/15/22   Narda Amber K, DO  torsemide (DEMADEX) 20 MG tablet Take 4 tablets (80 mg total) by mouth every morning AND 3 tablets (60 mg total) every evening. 10/01/21   Joette Catching, PA-C      Allergies    Ceftin, Geodon [ziprasidone hcl], Lisinopril, Cefuroxime, Sulfacetamide sodium-sulfur, Allopurinol, Ativan [lorazepam], Sulfa antibiotics, and Valium [diazepam]    Review of Systems   Review of Systems  Cardiovascular:  Positive for near-syncope.    Physical Exam Updated Vital Signs BP 110/64   Pulse 73   Temp 98 F (36.7 C)   Resp 20   Ht '5\' 3"'$  (1.6 m)   Wt 87.5 kg   SpO2 100%   BMI 34.17 kg/m  Physical Exam Vitals and nursing note reviewed.  Constitutional:      General: She is not in acute distress.    Appearance: Normal appearance.     Comments: chronically ill-appearing but in no acute distress, nontoxic, nonseptic appearing at this time  HENT:     Head: Normocephalic and atraumatic.  Eyes:     General:        Right eye: No discharge.        Left eye: No discharge.  Cardiovascular:     Rate and Rhythm: Normal rate and regular rhythm.     Heart sounds: No murmur heard.    No friction rub. No gallop.  Pulmonary:     Effort: Pulmonary effort is normal.     Breath sounds: Normal breath sounds.  Abdominal:     General: Bowel sounds are normal.     Palpations: Abdomen is soft.  Skin:    General: Skin is warm and dry.     Capillary Refill: Capillary refill takes less than 2 seconds.  Neurological:     Mental Status: She is alert and oriented to person, place, and time.  Psychiatric:        Mood and Affect: Mood normal.        Behavior: Behavior normal.     ED Results / Procedures / Treatments   Labs (all labs ordered are listed, but only abnormal results are displayed) Labs Reviewed  BASIC METABOLIC PANEL - Abnormal; Notable for the following components:      Result Value   Sodium 134 (*)     Chloride 95 (*)    Glucose, Bld 134 (*)    BUN 29 (*)    Creatinine, Ser 2.19 (*)    GFR, Estimated 24 (*)    All other components within normal limits  CBC - Abnormal; Notable for the following components:   MCV 102.5 (*)    MCH 34.9 (*)    All other components within normal limits  HEPATIC FUNCTION PANEL - Abnormal; Notable for the following components:   Total Protein 8.7 (*)    Alkaline Phosphatase 139 (*)    All  other components within normal limits  TROPONIN I (HIGH SENSITIVITY) - Abnormal; Notable for the following components:   Troponin I (High Sensitivity) 25 (*)    All other components within normal limits  TROPONIN I (HIGH SENSITIVITY) - Abnormal; Notable for the following components:   Troponin I (High Sensitivity) 21 (*)    All other components within normal limits  BRAIN NATRIURETIC PEPTIDE    EKG EKG Interpretation  Date/Time:  Thursday June 25 2022 14:18:16 EST Ventricular Rate:  88 PR Interval:  140 QRS Duration: 162 QT Interval:  436 QTC Calculation: 527 R Axis:   224 Text Interpretation: Atrial-sensed ventricular-paced rhythm Abnormal ECG When compared with ECG of 22-Feb-2022 17:06, Vent. rate has increased BY   6 BPM Confirmed by Malvin Johns [phone]) on 06/25/2022 3:44:50 PM  Radiology DG Chest 2 View  Result Date: 06/25/2022 CLINICAL DATA:  SOB EXAM: CHEST - 2 VIEW COMPARISON:  02/22/2022. FINDINGS: The heart size and mediastinal contours are within normal limits. Both lungs are clear. No pneumothorax or pleural effusion. Aorta is calcified. There are thoracic degenerative changes. 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 IMPRESSION: No active cardiopulmonary disease. Electronically Signed   By: Sammie Bench M.D.   On: 06/25/2022 14:48    Procedures Procedures    Medications Ordered in ED Medications  sodium chloride 0.9 % bolus 500 mL (0 mLs Intravenous Stopped 06/25/22 1751)    ED Course/ Medical Decision Making/ A&P                              Medical Decision Making Amount and/or Complexity of Data Reviewed Labs: ordered. Radiology: ordered.  Risk Prescription drug management.   This patient is a 72 y.o. female who presents to the ED for concern of near syncope, this involves an extensive number of treatment options, and is a complaint that carries with it a high risk of complications and morbidity. The emergent differential diagnosis prior to evaluation includes, but is not limited to,  CVA, ACS, arrhythmia, vasovagal syncope, orthostatic hypotension, sepsis, hypoglycemia, electrolyte disturbance, respiratory failure, symptomatic anemia, dehydration, heat injury, polypharmacy, malignancy, anxiety/panic attack.  Additionally considered exacerbation of current known heart failure, CKD, or other chronic comorbidities. This is not an exhaustive differential.   Past Medical History / Co-morbidities / Social History: chronic systolic heart failure, left bundle branch block, hypertension, CKD, obesity, anxiety, depression, previous breast cancer, status post treatment  Additional history: Chart reviewed. Pertinent results include: Reviewed lab work, imaging from outpatient PCP, as well as previous emergency department visits  Physical Exam: Physical exam performed. The pertinent findings include: Patient is overall in no distress on my exam, she has stable vital signs, she is neither hyper nor hypotensive other than equivocal blood pressure of 107/59 during her evaluation.  She has stable oxygen saturation on room air, she is not tachycardic.  No signs of acute distress, no significant tenderness to palpation of the abdomen despite some nausea that she reports.  Lab Tests: I ordered, and personally interpreted labs.  The pertinent results include: Initial troponin is elevated at 25, however is flat to improved at 21 on reevaluation, overall based on her clinical presentation I have low suspicion for acute cardiac ischemia, she has had  elevated troponin in the past, with history of heart failure may have chronic minor trope leak.  Her BNP is unremarkable today, if anything she looks mildly fluid down, slowly administered small fluid bolus especially in context  of her elevated BUN, creatinine from baseline.  Her BMP is otherwise overall unremarkable, sodium is mildly decreased at 134, but other electrolytes are fairly stable, mild hypochloremia noted as well.  Hepatic function panel is overall unremarkable other than mild elevation of alkaline phosphatase, 139 with upper limit of normal 126.  CBC without clinically significant leukocytosis or anemia.   Imaging Studies: I ordered imaging studies including plain film chest x-ray. I independently visualized and interpreted imaging which showed no active cardiopulmonary disease. I agree with the radiologist interpretation.   Cardiac Monitoring:  The patient was maintained on a cardiac monitor.  My attending physician Dr. Tamera Punt viewed and interpreted the cardiac monitored which showed an underlying rhythm of: Atrial paced rhythm, no significant change from baseline.  We interrogated the patient's pacemaker, which showed that she has not had any episodes of VT, VF, or other arrhythmias in the last day.  It appears to be functioning appropriately. I agree with this interpretation.   Medications: I ordered medication including small fluid bolus as discussed above for mild dehydration. Reevaluation of the patient after these medicines showed that the patient improved. I have reviewed the patients home medicines and have made adjustments as needed.  I will discharge with some nausea medication as patient reports that she just has not had much of an appetite recently, think that lack of oral intake, and mild dehydration is likely the major contributing factor to her recent syncope/near syncopal episode   Disposition: After consideration of the diagnostic results and the patients response to  treatment, I feel that patient is overall stable for discharge at this time, with no evidence of any emergent condition, favor vasovagal near syncope versus syncope, with no traumatic injury, no evidence of cardiac abnormality today, encourage close PCP follow-up.   emergency department workup does not suggest an emergent condition requiring admission or immediate intervention beyond what has been performed at this time. The plan is: as above. The patient is safe for discharge and has been instructed to return immediately for worsening symptoms, change in symptoms or any other concerns.  I discussed this case with my attending physician Dr. Tamera Punt who cosigned this note including patient's presenting symptoms, physical exam, and planned diagnostics and interventions. Attending physician stated agreement with plan or made changes to plan which were implemented.    Final Clinical Impression(s) / ED Diagnoses Final diagnoses:  Vasovagal near syncope    Rx / DC Orders ED Discharge Orders          Ordered    ondansetron (ZOFRAN) 4 MG tablet  Every 6 hours        06/25/22 1804              Embry Manrique, Joesph Fillers, PA-C 06/25/22 1833    Malvin Johns, MD 06/25/22 2000

## 2022-06-25 NOTE — ED Notes (Signed)
Spoke to Haena of [employer]. Faxing report shortly.

## 2022-06-25 NOTE — Patient Outreach (Signed)
  Care Coordination   06/25/2022 Name: Rhonda [name] MRN: [id number] DOB: May 18, 1951   Care Coordination Outreach Attempts:  An unsuccessful telephone outreach was attempted today to offer the patient information about available care coordination services as a benefit of their health plan.   Follow Up Plan:  Additional outreach attempts will be made to offer the patient care coordination information and services.   Encounter Outcome:  No Answer   Care Coordination Interventions:  No, not indicated    Raina Mina, RN Care Management Coordinator [place] Office [phone]

## 2022-06-25 NOTE — ED Triage Notes (Signed)
Patient here POV from Home  Endorses Hypotension since Last PM. Had Near Syncopal Episode Last PM. States she was just going to the [facility] when she became faint. States she had not eaten all day because she was nauseous.   BP was 77/48 Last PM. Some SOB and a headache.   NAD noted during Triage. A&Ox4. GCS 15. BIB Wheelchair.

## 2022-06-25 NOTE — ED Notes (Signed)
Patient transported to X-ray 

## 2022-06-26 DIAGNOSIS — N1832 Chronic kidney disease, stage 3b: Secondary | ICD-10-CM | POA: Diagnosis not present

## 2022-06-26 DIAGNOSIS — I5032 Chronic diastolic (congestive) heart failure: Secondary | ICD-10-CM | POA: Diagnosis not present

## 2022-06-26 DIAGNOSIS — I130 Hypertensive heart and chronic kidney disease with heart failure and stage 1 through stage 4 chronic kidney disease, or unspecified chronic kidney disease: Secondary | ICD-10-CM | POA: Diagnosis not present

## 2022-06-26 DIAGNOSIS — E785 Hyperlipidemia, unspecified: Secondary | ICD-10-CM | POA: Diagnosis not present

## 2022-06-26 DIAGNOSIS — I10 Essential (primary) hypertension: Secondary | ICD-10-CM | POA: Diagnosis not present

## 2022-06-26 DIAGNOSIS — F331 Major depressive disorder, recurrent, moderate: Secondary | ICD-10-CM | POA: Diagnosis not present

## 2022-06-26 DIAGNOSIS — E039 Hypothyroidism, unspecified: Secondary | ICD-10-CM | POA: Diagnosis not present

## 2022-06-26 DIAGNOSIS — E1140 Type 2 diabetes mellitus with diabetic neuropathy, unspecified: Secondary | ICD-10-CM | POA: Diagnosis not present

## 2022-06-26 NOTE — Progress Notes (Signed)
EPIC Encounter for ICM Monitoring  Patient Name: Rhonda [name] is a 72 y.o. female Date: 06/26/2022 Primary Care Physican: [name], MD Primary Cardiologist: Bensimhon Electrophysiologist: Santina Evans Pacing: 98.7%   03/24/2022 Weight: 191 lbs 04/13/2022 Weight: 192 lbs 11/27/023 Weight: 190 lbs 05/26/2022 Weight: 197 lbs 06/01/2022 Weight: 193 lbs           Transmission reviewed.  ED visit 2/1 and no arrhythmias found on [facility] report per ED note.                [employer] thoracic impedance suggesting fluid levels returned to normal.      Prescribed:  Patient participating in paramedicine program to assist with meds. Torsemide 20 mg 4 tablets (80 mg total) by mouth every morning and 3 tablets (60 mg total) every evening.   Potassium 20 meq 2 tablets (40 mEq total) by mouth 3 (three) times daily.  Take extra 2 tablets on days you take Metolazone.   Metolazone 2.'5mg'$  1 tablet every other Tuesday.  (next dosage due 06/09/22) Spironolactone 25 mg take 1 tablet by mouth every evening.   Labs: 04/08/2022 Creatinine 1.60, BUN 31, Potassium 4.1, Sodium 138, GFR 35 03/18/2022 Creatinine 2.12, BUN 43, Potassium 4.4, Sodium 134, GFR 25  02/22/2022 Creatinine 1.67, BUN 39, Potassium 3.9, Sodium 134, GFR 33 02/04/2022 Creatinine 1.71, BUN 18, Potassium 4.4, Sodium 136, GFR 32  A complete set of results can be found in Results Review.   Recommendations:  No changes.     Follow-up plan: ICM clinic phone appointment on 07/08/2022.   91 day device clinic remote transmission 09/14/2022.     EP/Cardiology Office Visits:   Recall 07/06/2022 with Dr Haroldine Laws.  Recall 03/20/2023 with Dr Lovena Le.     Copy of ICM check sent to Dr. Lovena Le.   3 month ICM trend: 06/25/2022.    12-14 Month ICM trend:     [name], RN 06/26/2022 1:04 PM

## 2022-06-29 ENCOUNTER — Other Ambulatory Visit (HOSPITAL_COMMUNITY): Payer: Self-pay

## 2022-06-29 ENCOUNTER — Other Ambulatory Visit (HOSPITAL_COMMUNITY): Payer: Self-pay | Admitting: Internal Medicine

## 2022-06-29 ENCOUNTER — Ambulatory Visit: Payer: Medicare HMO | Admitting: Psychology

## 2022-06-29 ENCOUNTER — Ambulatory Visit: Payer: Medicare HMO

## 2022-06-29 DIAGNOSIS — R419 Unspecified symptoms and signs involving cognitive functions and awareness: Secondary | ICD-10-CM

## 2022-06-29 NOTE — Progress Notes (Signed)
   Neuropsychology Note [name] of Neu[facility]      Rhonda [name] presented for her appointment this afternoon after previously confirming it. Upon hearing the purpose of the evaluation, she quickly became animated, raising her voice, becoming tearful, and adamantly stating that she would not participate in the testing process. Per her report, she described marital discord with her husband over the weekend, leading to increased stress and diminished sleep which would prevent her from performing at her best as the reason for her refusal. She did report that she would be willing to be tested a different day under different circumstances.    She was rescheduled for 06/02/2023 to re-attempt the evaluation.

## 2022-06-29 NOTE — Progress Notes (Signed)
Paramedicine Encounter    Patient ID: Rhonda [name], female    DOB: 06/14/1950, 72 y.o.   MRN: [id number]   Complaints- Headache, Depression   Assessment- A&OX4, warm and dry, ambulatory with cane, depressed, angry at husband from dispute earlier.   Compliance with meds- missed 3 noon doses and this mornings medications   Pill box filled- for one week   Refills needed- Torsemide, Bentyl, Clonazepam   Meds changes since last visit- NONE     Social changes- NONE    BP (!) 150/80   Pulse 90   Resp 16   Wt 187 lb (84.8 kg)   SpO2 100%   BMI 33.13 kg/m  Weight yesterday-didn't weigh Last visit weight-191lbs Today- 187lbs  Arrived for home visit for Rhonda [name] who just returned home from Neurology appointment where she refused to take a cognitive exam due to her emotional upset and having a headache. I explained to her the reasoning for the testing and she continued to state she couldn't have done it today.   She states she has had a headache since Saturday- she was seen in ER and last week and given IV fluids and discharged.   I obtained vitals and reviewed same. She has not yet had her morning meds today. I reviewed meds and gave her- her morning medications.   I filled pill box for one week.  Refills: Torsemide Bentyl Clonazepam   Appointments reviewed and confirmed.   I provided [place] emotional support discussion, education and resources given for same. Home visit complete. I will see Rhonda [name] in one week.    [name], [place]  ACTION: Home visit completed    Patient Care Team: [name], MD as PCP - General (Family Medicine) [name], LCSW as Social Worker (Licensed Clinical Social Worker) [name], [name], DO as Consulting Physician (Neurology) [name], Paramedic as Paramedic [name] [name], MD as Attending Physician (Family Medicine)  Patient Active Problem List   Diagnosis Date Noted   Elevated  troponin 12/02/2021   Nausea & vomiting 12/01/2021   Acute left ankle pain 10/11/2021   Intractable nausea and vomiting 10/08/2021   AKI (acute kidney injury) ([place]) 10/08/2021   Hyponatremia 10/08/2021   Decreased estrogen level 08/01/2021   Dementia ([place]) 08/01/2021   Frail elderly 08/01/2021   Hyperparathyroidism ([place]) 08/01/2021   Spondylolisthesis 08/01/2021   Varicose veins of bilateral lower extremities with other complications 27/74/1287   Lumbago without sciatica 04/30/2021   Abnormal gait 06/11/2020   Adult failure to thrive syndrome 06/11/2020   Allergic rhinitis 06/11/2020   Anxiety 06/11/2020   Asthma 06/11/2020   Benign intracranial hypertension 06/11/2020   Body mass index (BMI) 45.0-49.9, adult ([place]) 06/11/2020   Bowel incontinence 06/11/2020   Cholelithiasis 06/11/2020   Chronic sinusitis 06/11/2020   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   Daytime somnolence 06/11/2020   Diarrhea of presumed infectious origin 06/11/2020   Edema 06/11/2020   Family history of malignant neoplasm of gastrointestinal tract 06/11/2020   Gout 06/11/2020   History of fall 06/11/2020   History of infectious disease 06/11/2020   Insomnia 06/11/2020   Irregular bowel habits 06/11/2020   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   Lumbar spondylosis with myelopathy 06/11/2020   Macrocytosis 06/11/2020   Mild recurrent major depression ([place]) 06/11/2020   Personal history of colonic polyps 06/11/2020   Personal history of malignant neoplasm of breast 06/11/2020   Repeated falls 06/11/2020   Mixed anxiety and depressive disorder 06/11/2020   Irritable bowel syndrome  01/04/2020   Spinal stenosis of lumbar region 01/03/2020   Other symptoms and signs involving the musculoskeletal system 09/11/2019   Bilateral leg weakness 08/01/2019   Leg swelling 08/01/2019   Diabetes mellitus with neuropathy ([place]) 06/15/2019   Hypokalemia 11/08/2018   CKD (chronic kidney disease) stage 3, GFR 30-59 ml/min (HCC) 11/08/2018    Orthostatic hypotension 07/28/2017   SVD (spontaneous vaginal delivery)    Peripheral neuropathy    On home oxygen therapy    Migraines    Left bundle branch block    Hypothyroidism    Hypertension    Hyperlipidemia    Heart murmur    GERD (gastroesophageal reflux disease)    Exertional shortness of breath    Major depressive disorder    Back pain    Arthritis    Generalized anxiety disorder    Anemia    Chronic respiratory failure ([place]) 09/14/2013   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   Morbid obesity ([place]) 91/47/8295   Chronic systolic heart failure ([place]) 10/27/2012   Endometrial polyp 01/20/2012   Malignant tumor of breast ([place]) 03/26/2011   Unspecified vitamin D deficiency 03/26/2011    Current Outpatient Medications:    acetaminophen (TYLENOL) 500 MG tablet, Take 1,000 mg by mouth daily as needed (for pain and fever of 101 or greater)., Disp: , Rfl:    acetaZOLAMIDE (DIAMOX) 125 MG tablet, Take 1 tablet (125 mg total) by mouth daily., Disp: 90 tablet, Rfl: 3   Apoaequorin (PREVAGEN EXTRA STRENGTH) 20 MG CAPS, Take 20 mg by mouth daily., Disp: , Rfl:    Ascorbic Acid (VITAMIN C) 1000 MG tablet, Take 1,000 mg by mouth daily., Disp: , Rfl:    buPROPion (WELLBUTRIN XL) 150 MG 24 hr tablet, Take 150 mg by mouth daily., Disp: , Rfl:    carvedilol (COREG) 3.125 MG tablet, TAKE 1 TABLET (3.125 MG) BY MOUTH TWICE DAILY WITH MEALS, Disp: 180 tablet, Rfl: 3   citalopram (CELEXA) 20 MG tablet, Take 20 mg by mouth daily., Disp: , Rfl:    clonazePAM (KLONOPIN) 0.5 MG tablet, Take 0.5 mg by mouth 2 (two) times daily as needed for anxiety., Disp: , Rfl:    cyanocobalamin (VITAMIN B12) 1000 MCG tablet, Take 1,000 mcg by mouth daily., Disp: , Rfl:    dicyclomine (BENTYL) 20 MG tablet, Take 20 mg by mouth 3 (three) times daily before meals., Disp: , Rfl:    famotidine-calcium carbonate-magnesium hydroxide (PEPCID COMPLETE) 10-800-165 MG chewable  tablet, Chew 1 tablet by mouth daily as needed., Disp: , Rfl:    fluticasone (FLONASE) 50 MCG/ACT nasal spray, 1 spray in each nostril, Disp: , Rfl:    gabapentin (NEURONTIN) 100 MG capsule, Take 1 capsule (100 mg total) by mouth 2 (two) times daily., Disp: 180 capsule, Rfl: 3   levothyroxine (SYNTHROID) 88 MCG tablet, TAKE 1 TABLET(88 MCG) BY MOUTH DAILY BEFORE BREAKFAST, Disp: 90 tablet, Rfl: 3   metolazone (ZAROXOLYN) 2.5 MG tablet, Take 1 tablet by mouth every other Tuesday., Disp: 12 tablet, Rfl: 3   midodrine (PROAMATINE) 10 MG tablet, TAKE 1 TABLET THREE TIMES DAILY WITH MEALS, Disp: 270 tablet, Rfl: 3   Multiple Vitamin (MULTIVITAMIN WITH MINERALS) TABS tablet, Take 1 tablet by mouth daily., Disp: , Rfl:    ondansetron (ZOFRAN) 4 MG tablet, Take 1 tablet (4 mg total) by mouth every 6 (six) hours., Disp: 15 tablet, Rfl: 0   pantoprazole (PROTONIX) 40 MG tablet, Take 40 mg by mouth daily as needed (for  acid reflux)., Disp: , Rfl:    potassium chloride SA (KLOR-CON M) 20 MEQ tablet, TAKE 2 TABLETS THREE TIMES DAILY. TAKE AN EXTRA 2 TABLETS ON DAYS YOU TAKE METOLAZONE, Disp: 572 tablet, Rfl: 0   simvastatin (ZOCOR) 10 MG tablet, TAKE 1 TABLET EVERY EVENING, Disp: 90 tablet, Rfl: 1   spironolactone (ALDACTONE) 25 MG tablet, TAKE 1 TABLET (25 MG TOTAL) BY MOUTH EVERY EVENING., Disp: 90 tablet, Rfl: 0   tiZANidine (ZANAFLEX) 2 MG tablet, Take 1 tablet (2 mg total) by mouth at bedtime as needed for muscle spasms., Disp: 90 tablet, Rfl: 1   torsemide (DEMADEX) 20 MG tablet, Take 4 tablets (80 mg total) by mouth every morning AND 3 tablets (60 mg total) every evening., Disp: 210 tablet, Rfl: 11 Allergies  Allergen Reactions   Ceftin Anaphylaxis    Face and throat swell    Geodon [Ziprasidone Hcl] Hives   Lisinopril Other (See Comments)    angioedema   Cefuroxime     Other reaction(s): anaphylaxis   Sulfacetamide Sodium-Sulfur     Other reaction(s): itch   Allopurinol Nausea Only and Other (See  Comments)    weakness   Ativan [Lorazepam] Itching   Sulfa Antibiotics Itching   Valium [Diazepam] Other (See Comments)    Patient states that diazepam doesn't relax, it has the opposite effect.     Social History   Socioeconomic History   Marital status: Married    Spouse name: Not on file   Number of children: 2   Years of education: 11   Highest education level: Master's degree (e.g., MA, MS, MEng, MEd, MSW, MBA)  Occupational History   Occupation: retired  Tobacco Use   Smoking status: Some Days    Packs/day: 0.10    Years: 26.00    Total pack years: 2.60    Types: Cigarettes    Last attempt to quit: 03/23/2021    Years since quitting: 1.2   Smokeless tobacco: Never  Vaping Use   Vaping Use: Never used  Substance and Sexual Activity   Alcohol use: No   Drug use: No   Sexual activity: Yes  Other Topics Concern   Not on file  Social History Narrative   Tobacco Use Cigarettes: Former Smoker, Quit in 2008   No Alcohol   No recreational drug use   Diet: Regular/Low Carb   Exercise: None   Occupation: disabled   Education: Research officer, political party, masters   Children: 2   Firearms: No   Therapist, art Use: 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      Social Determinants of Health   Financial Resource Strain: Not on file  Food Insecurity: No Food Insecurity (06/19/2022)   Hunger Vital Sign    Worried About Running Out of Food in the Last Year: Never true    Ran Out of Food in the Last Year: Never true  Transportation Needs: No Transportation Needs (06/19/2022)   PRAPARE - Hydrologist (Medical): No    Lack of Transportation (Non-Medical): No  Physical Activity: Not on file  Stress: Not on file  Social Connections: Not on file  Intimate Partner Violence: Not on file    Physical Exam      Future Appointments  Date Time Provider [place]  07/08/2022  7:00 AM [facility] DEVICE REMOTES [facility]  09/09/2022  3:15 PM Marzetta Board, DPM [facility]  09/14/2022  7:10 AM [facility] DEVICE REMOTES [facility]  12/14/2022  7:10 AM [facility] DEVICE REMOTES [facility]  03/15/2023  7:10 AM [facility] DEVICE REMOTES [facility]  06/02/2023  1:00 PM Hazle Coca, PhD [facility] None  06/14/2023  7:10 AM [facility] DEVICE REMOTES [facility]  06/29/2023  1:30 PM Narda Amber K, DO [facility] None  09/13/2023  7:10 AM [facility] DEVICE REMOTES [facility]

## 2022-06-30 ENCOUNTER — Ambulatory Visit: Payer: Medicare HMO | Admitting: Podiatry

## 2022-07-06 ENCOUNTER — Other Ambulatory Visit (HOSPITAL_COMMUNITY): Payer: Self-pay

## 2022-07-06 NOTE — Progress Notes (Signed)
Paramedicine Encounter    Patient ID: Rhonda [name], female    DOB: 1951/04/21, 72 y.o.   MRN: [id number]   Complaints- anxiety, depression.  Assessment- A&O sitting in her chair in her room, no complaints aside from feeling depressed and anxious. No lower leg edema, lungs clear,no shob while ambulating.   Compliance with meds- no missed doses   Pill box filled- for one week.   Refills needed- tosemide, bentyl, clonazepam ([facility] will have ready tomorrow)   Meds changes since last visit- NONE    Social changes- NONE    BP (!) 140/80   Pulse 90   Resp 18   Wt 187 lb (84.8 kg)   SpO2 98%   BMI 33.13 kg/m  Weight yesterday-didn't weigh Last visit weight-187lbs Wt today- 187lbs   Arrived for home visit for Rhonda [name] who reports to be feeling okay other than continuous depression and anxiety and aggravation with her husband.   She has had no missed doses of medications.   No lower leg swelling, lungs clear, no shortness of breath while ambulating.   We reviewed med compliance and diet compliance- encouraging her to continue to remain on a heart healthy diet. She advised her meals are mostly take out due to her husbands work schedule. I will see if [facility] could provide moms meals as we have her on the meals on wheels wait list.  Will forward to [place].  Meds reviewed and pill box filled..  Refills ordered at [facility]- husband will pick up tomorrow and place missing pills in box.   Appointments reviewed.   I will see [place] in one week unless she reaches out for needs in the meantime.   Home visit complete.    [name], [place]  ACTION: Home visit completed    Patient Care Team: [name], MD as PCP - General (Family Medicine) [name], LCSW as Social Worker (Licensed Clinical Social Worker) [name], [name], DO as Consulting Physician (Neurology) [name], Paramedic as Paramedic [name] [name], MD as  Attending Physician (Family Medicine)  Patient Active Problem List   Diagnosis Date Noted   Elevated troponin 12/02/2021   Nausea & vomiting 12/01/2021   Acute left ankle pain 10/11/2021   Intractable nausea and vomiting 10/08/2021   AKI (acute kidney injury) ([place]) 10/08/2021   Hyponatremia 10/08/2021   Decreased estrogen level 08/01/2021   Dementia ([place]) 08/01/2021   Frail elderly 08/01/2021   Hyperparathyroidism ([place]) 08/01/2021   Spondylolisthesis 08/01/2021   Varicose veins of bilateral lower extremities with other complications [id number]   Lumbago without sciatica 04/30/2021   Abnormal gait 06/11/2020   Adult failure to thrive syndrome 06/11/2020   Allergic rhinitis 06/11/2020   Anxiety 06/11/2020   Asthma 06/11/2020   Benign intracranial hypertension 06/11/2020   Body mass index (BMI) 45.0-49.9, adult ([place]) 06/11/2020   Bowel incontinence 06/11/2020   Cholelithiasis 06/11/2020   Chronic sinusitis 06/11/2020   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   Daytime somnolence 06/11/2020   Diarrhea of presumed infectious origin 06/11/2020   Edema 06/11/2020   Family history of malignant neoplasm of gastrointestinal tract 06/11/2020   Gout 06/11/2020   History of fall 06/11/2020   History of infectious disease 06/11/2020   Insomnia 06/11/2020   Irregular bowel habits 06/11/2020   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   Lumbar spondylosis with myelopathy 06/11/2020   Macrocytosis 06/11/2020   Mild recurrent major depression ([place]) 06/11/2020   Personal history of colonic polyps 06/11/2020   Personal history of  malignant neoplasm of breast 06/11/2020   Repeated falls 06/11/2020   Mixed anxiety and depressive disorder 06/11/2020   Irritable bowel syndrome 01/04/2020   Spinal stenosis of lumbar region 01/03/2020   Other symptoms and signs involving the musculoskeletal system 09/11/2019   Bilateral leg weakness 08/01/2019   Leg swelling 08/01/2019   Diabetes mellitus with neuropathy ([place])  06/15/2019   Hypokalemia 11/08/2018   CKD (chronic kidney disease) stage 3, GFR 30-59 ml/min (HCC) 11/08/2018   Orthostatic hypotension 07/28/2017   SVD (spontaneous vaginal delivery)    Peripheral neuropathy    On home oxygen therapy    Migraines    Left bundle branch block    Hypothyroidism    Hypertension    Hyperlipidemia    Heart murmur    GERD (gastroesophageal reflux disease)    Exertional shortness of breath    Major depressive disorder    Back pain    Arthritis    Generalized anxiety disorder    Anemia    Chronic respiratory failure ([place]) 09/14/2013   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   Morbid obesity ([place]) 0000000   Chronic systolic heart failure ([place]) 10/27/2012   Endometrial polyp 01/20/2012   Malignant tumor of breast ([place]) 03/26/2011   Unspecified vitamin D deficiency 03/26/2011    Current Outpatient Medications:    acetaminophen (TYLENOL) 500 MG tablet, Take 1,000 mg by mouth daily as needed (for pain and fever of 101 or greater)., Disp: , Rfl:    acetaZOLAMIDE (DIAMOX) 125 MG tablet, Take 1 tablet (125 mg total) by mouth daily., Disp: 90 tablet, Rfl: 3   Apoaequorin (PREVAGEN EXTRA STRENGTH) 20 MG CAPS, Take 20 mg by mouth daily., Disp: , Rfl:    Ascorbic Acid (VITAMIN C) 1000 MG tablet, Take 1,000 mg by mouth daily., Disp: , Rfl:    buPROPion (WELLBUTRIN XL) 150 MG 24 hr tablet, Take 150 mg by mouth daily., Disp: , Rfl:    carvedilol (COREG) 3.125 MG tablet, TAKE 1 TABLET (3.125 MG) BY MOUTH TWICE DAILY WITH MEALS, Disp: 180 tablet, Rfl: 3   citalopram (CELEXA) 20 MG tablet, Take 20 mg by mouth daily., Disp: , Rfl:    clonazePAM (KLONOPIN) 0.5 MG tablet, Take 0.5 mg by mouth 2 (two) times daily as needed for anxiety., Disp: , Rfl:    cyanocobalamin (VITAMIN B12) 1000 MCG tablet, Take 1,000 mcg by mouth daily., Disp: , Rfl:    dicyclomine (BENTYL) 20 MG tablet, Take 20 mg by mouth 3 (three) times daily before  meals., Disp: , Rfl:    famotidine-calcium carbonate-magnesium hydroxide (PEPCID COMPLETE) 10-800-165 MG chewable tablet, Chew 1 tablet by mouth daily as needed., Disp: , Rfl:    fluticasone (FLONASE) 50 MCG/ACT nasal spray, 1 spray in each nostril, Disp: , Rfl:    gabapentin (NEURONTIN) 100 MG capsule, Take 1 capsule (100 mg total) by mouth 2 (two) times daily., Disp: 180 capsule, Rfl: 3   levothyroxine (SYNTHROID) 88 MCG tablet, TAKE 1 TABLET(88 MCG) BY MOUTH DAILY BEFORE BREAKFAST, Disp: 90 tablet, Rfl: 3   metolazone (ZAROXOLYN) 2.5 MG tablet, Take 1 tablet by mouth every other Tuesday., Disp: 12 tablet, Rfl: 3   midodrine (PROAMATINE) 10 MG tablet, TAKE 1 TABLET THREE TIMES DAILY WITH MEALS, Disp: 270 tablet, Rfl: 3   Multiple Vitamin (MULTIVITAMIN WITH MINERALS) TABS tablet, Take 1 tablet by mouth daily., Disp: , Rfl:    ondansetron (ZOFRAN) 4 MG tablet, Take 1 tablet (4 mg total) by mouth every 6 (  six) hours., Disp: 15 tablet, Rfl: 0   pantoprazole (PROTONIX) 40 MG tablet, Take 40 mg by mouth daily as needed (for acid reflux)., Disp: , Rfl:    potassium chloride SA (KLOR-CON M) 20 MEQ tablet, TAKE 2 TABLETS THREE TIMES DAILY. TAKE AN EXTRA 2 TABLETS ON DAYS YOU TAKE METOLAZONE, Disp: 572 tablet, Rfl: 0   simvastatin (ZOCOR) 10 MG tablet, TAKE 1 TABLET EVERY EVENING, Disp: 90 tablet, Rfl: 1   spironolactone (ALDACTONE) 25 MG tablet, TAKE 1 TABLET (25 MG TOTAL) BY MOUTH EVERY EVENING., Disp: 90 tablet, Rfl: 0   tiZANidine (ZANAFLEX) 2 MG tablet, Take 1 tablet (2 mg total) by mouth at bedtime as needed for muscle spasms., Disp: 90 tablet, Rfl: 1   torsemide (DEMADEX) 20 MG tablet, Take 4 tablets (80 mg total) by mouth every morning AND 3 tablets (60 mg total) every evening., Disp: 210 tablet, Rfl: 11 Allergies  Allergen Reactions   Ceftin Anaphylaxis    Face and throat swell    Geodon [Ziprasidone Hcl] Hives   Lisinopril Other (See Comments)    angioedema   Cefuroxime     Other  reaction(s): anaphylaxis   Sulfacetamide Sodium-Sulfur     Other reaction(s): itch   Allopurinol Nausea Only and Other (See Comments)    weakness   Ativan [Lorazepam] Itching   Sulfa Antibiotics Itching   Valium [Diazepam] Other (See Comments)    Patient states that diazepam doesn't relax, it has the opposite effect.     Social History   Socioeconomic History   Marital status: Married    Spouse name: Not on file   Number of children: 2   Years of education: 5   Highest education level: Master's degree (e.g., MA, MS, MEng, MEd, MSW, MBA)  Occupational History   Occupation: retired  Tobacco Use   Smoking status: Some Days    Packs/day: 0.10    Years: 26.00    Total pack years: 2.60    Types: Cigarettes    Last attempt to quit: 03/23/2021    Years since quitting: 1.2   Smokeless tobacco: Never  Vaping Use   Vaping Use: Never used  Substance and Sexual Activity   Alcohol use: No   Drug use: No   Sexual activity: Yes  Other Topics Concern   Not on file  Social History Narrative   Tobacco Use Cigarettes: Former Smoker, Quit in 2008   No Alcohol   No recreational drug use   Diet: Regular/Low Carb   Exercise: None   Occupation: disabled   Education: Research officer, political party, masters   Children: 2   Firearms: No   Therapist, art Use: 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      Social Determinants of Health   Financial Resource Strain: Not on file  Food Insecurity: No Food Insecurity (06/19/2022)   Hunger Vital Sign    Worried About Running Out of Food in the Last Year: Never true    Ran Out of Food in the Last Year: Never true  Transportation Needs: No Transportation Needs (06/19/2022)   PRAPARE - Hydrologist (Medical): No    Lack of Transportation (Non-Medical): No  Physical Activity: Not on file  Stress: Not on file  Social Connections: Not on file  Intimate Partner Violence: Not on file    Physical  Exam      Future Appointments  Date Time Provider [place]  07/08/2022  7:00 AM [facility] DEVICE REMOTES [facility]  07/29/2022  2:00 PM Barbaraann Faster, RN [facility] None  09/09/2022  3:15 PM Marzetta Board, DPM [facility]  09/14/2022  7:10 AM [facility] DEVICE REMOTES [facility]  12/14/2022  7:10 AM [facility] DEVICE REMOTES [facility]  03/15/2023  7:10 AM [facility] DEVICE REMOTES [facility]  06/02/2023  1:00 PM Hazle Coca, PhD [facility] None  06/14/2023  7:10 AM [facility] DEVICE REMOTES [facility]  06/29/2023  1:30 PM Narda Amber K, DO [facility] None  09/13/2023  7:10 AM [facility] DEVICE REMOTES [facility]

## 2022-07-07 ENCOUNTER — Encounter: Payer: Medicare HMO | Admitting: Psychology

## 2022-07-08 ENCOUNTER — Telehealth (HOSPITAL_COMMUNITY): Payer: Self-pay | Admitting: Licensed Clinical Social Worker

## 2022-07-08 ENCOUNTER — Telehealth (HOSPITAL_COMMUNITY): Payer: Self-pay

## 2022-07-08 ENCOUNTER — Ambulatory Visit: Payer: Medicare HMO | Attending: Internal Medicine

## 2022-07-08 DIAGNOSIS — Z03818 Encounter for observation for suspected exposure to other biological agents ruled out: Secondary | ICD-10-CM | POA: Diagnosis not present

## 2022-07-08 DIAGNOSIS — R0981 Nasal congestion: Secondary | ICD-10-CM | POA: Diagnosis not present

## 2022-07-08 DIAGNOSIS — Z95810 Presence of automatic (implantable) cardiac defibrillator: Secondary | ICD-10-CM

## 2022-07-08 DIAGNOSIS — J069 Acute upper respiratory infection, unspecified: Secondary | ICD-10-CM | POA: Diagnosis not present

## 2022-07-08 DIAGNOSIS — R030 Elevated blood-pressure reading, without diagnosis of hypertension: Secondary | ICD-10-CM | POA: Diagnosis not present

## 2022-07-08 DIAGNOSIS — I5022 Chronic systolic (congestive) heart failure: Secondary | ICD-10-CM | POA: Diagnosis not present

## 2022-07-08 DIAGNOSIS — J029 Acute pharyngitis, unspecified: Secondary | ICD-10-CM | POA: Diagnosis not present

## 2022-07-08 MED ORDER — TORSEMIDE 20 MG PO TABS: 80.0000 mg | ORAL_TABLET | Freq: Two times a day (BID) | ORAL | 6 refills | Status: DC

## 2022-07-08 NOTE — Telephone Encounter (Signed)
H&V Care Navigation CSW Progress Note  Clinical Social Worker informed by [employer] that pt would benefit from [employer] referral as she struggles to eat heart health diet.    Patient identified as a candidate to receive 7 heart healthy meals per week for 4 weeks through [facility]  Completed referral sent in for review.  Anticipate patient will receive first shipment of food in 1-3 business days.   SDOH Screenings   Food Insecurity: No Food Insecurity (06/19/2022)  Housing: Low Risk  (06/19/2022)  Transportation Needs: No Transportation Needs (06/19/2022)  Utilities: Not At Risk (06/19/2022)  Tobacco Use: High Risk (06/25/2022)    [name], [facility] Desk#: [id number] Cell#: [phone]

## 2022-07-08 NOTE — Progress Notes (Signed)
Spoke with husband and advised of Rhonda [name]'s recommendation to increase Torsemide to 4 tablets (80 mg total) by mouth twice a day.  Labs are needed and scheduled at HF clinic on 2/28.  Advised will inform Rhonda [name] EMT of paramedicine program of med change.  Husband requested to send in Torsemide refill to [facility].

## 2022-07-08 NOTE — Telephone Encounter (Signed)
Spoke to [employer] RN about Rhonda [name]'s fluid increase and [name], NP ordered to increase Torsemide to 74m BID. Ed picked up torsemide and placed extra pill in the evening with no issues. I will follow up on Monday-Mariea Clontsand Ed agreed. Call complete.   HSalena Saner EBerry2/14/2024

## 2022-07-08 NOTE — Progress Notes (Signed)
[place], Maricela Bo, FNP  Sahmir Weatherbee Panda, RN Keep metolazone every other week, but increase torsemide to 80 bid. Repeat BMET in 10-14 days. Thanks!

## 2022-07-08 NOTE — Progress Notes (Signed)
EPIC Encounter for ICM Monitoring  Patient Name: Rhonda [name] is a 72 y.o. female Date: 07/08/2022 Primary Care Physican: [name], MD Primary Cardiologist: [place] Electrophysiologist: Santina Evans Pacing: 98.5%   03/24/2022 Weight: 191 lbs 04/13/2022 Weight: 192 lbs 11/27/023 Weight: 190 lbs 05/26/2022 Weight: 197 lbs 06/01/2022 Weight: 193 lbs  07/08/2022 Weight: 183 lbs          Spoke with husband and heart failure questions reviewed.  Transmission results reviewed.  Pt gets out of breath walking from one room to another.                  Optivol thoracic impedance suggesting fluid levels returning to normal after taking every other week of Metolazone on 2/13.      Prescribed:  Patient participating in paramedicine program to assist with meds. Torsemide 20 mg 4 tablets (80 mg total) by mouth every morning and 3 tablets (60 mg total) every evening.   Potassium 20 meq 2 tablets (40 mEq total) by mouth 3 (three) times daily.  Take extra 2 tablets on days you take Metolazone.   Metolazone 2.26m 1 tablet every other Tuesday.  (next dosage due 07/21/22) Spironolactone 25 mg take 1 tablet by mouth every evening.   Labs: 06/25/2022 Creatinine 2.19, BUN 29, Potassium 3.5, Sodium 134, GFR 24 04/08/2022 Creatinine 1.60, BUN 31, Potassium 4.1, Sodium 138, GFR 35 03/18/2022 Creatinine 2.12, BUN 43, Potassium 4.4, Sodium 134, GFR 25  02/22/2022 Creatinine 1.67, BUN 39, Potassium 3.9, Sodium 134, GFR 33 02/04/2022 Creatinine 1.71, BUN 18, Potassium 4.4, Sodium 136, GFR 32  A complete set of results can be found in Results Review.   Recommendations:  Copy sent to JAllena Katz NP for review and recommendations if needed.       Follow-up plan: ICM clinic phone appointment on 07/22/2022 to recheck fluid levels.   91 day device clinic remote transmission 09/14/2022.     EP/Cardiology Office Visits:   Recall 07/06/2022 with Dr BHaroldine Laws  Recall 03/20/2023 with Dr TLovena Le     Copy of  ICM check sent to Dr. TLovena Le   3 month ICM trend: 07/08/2022.    12-14 Month ICM trend:     [name] RN 07/08/2022 7:54 AM

## 2022-07-10 ENCOUNTER — Other Ambulatory Visit (HOSPITAL_COMMUNITY): Payer: Self-pay | Admitting: Physician Assistant

## 2022-07-10 ENCOUNTER — Encounter: Payer: Medicare HMO | Admitting: Psychology

## 2022-07-13 ENCOUNTER — Telehealth: Payer: Self-pay | Admitting: *Deleted

## 2022-07-13 ENCOUNTER — Other Ambulatory Visit (HOSPITAL_COMMUNITY): Payer: Self-pay

## 2022-07-13 DIAGNOSIS — I5022 Chronic systolic (congestive) heart failure: Secondary | ICD-10-CM

## 2022-07-13 NOTE — Telephone Encounter (Signed)
Rhonda [name] with paramedicine called to report pt is very dizzy, c/o diarrhea, pt is orthostatic. Pt said she had a fall this weekend and hit her head but Rhonda [name] said she does not see any marks. Pt has not taken any medications today. Bp 110/70 sitting and systolic bp drops to 82 standing. Pt was advised to go the emergency room but declined. Per Rhonda Billow [place],FNP hold carvedilol, spiro, torsemide, and metolazone until she has labs tomorrow.

## 2022-07-13 NOTE — Progress Notes (Signed)
Paramedicine Encounter    Patient ID: Rhonda [name], female    DOB: 06-20-1950, 72 y.o.   MRN: [id number]   Complaints- "had a rough weekend", multiple episodes of diarrhea, confusion, a fall where she reports hitting her head but no evidence of external injury noted. "Feeling unsteady", no appetite.   Assessment- Inge seated in her room reporting she had a rough weekend. She states she had multiple episodes of diarrhea and she fell resulting in her hitting her head. She denied any pain or external injuries. She was assisted up by her husband and she refused for EMS to be called at time of event. She reports feeling light headed and unsteady today. She has not eaten or taken any meds as of 4:15. I obtained orthostatic vitals which were positive. I called HF triage and she was instructed to go to ER and she refused same, [name] NP reports to hold Carvedilol, Torsemide, Spironolactone and Metolazone until she has labs taken tomorrow at 1230 in clinic. Sarafina and her husband agreed to this but know to call EMS or seek ER care if her symptoms worsen. She agreed.   I reviewed meds and filled pill box for one week.   I reviewed appointments and wrote down same.   I heat up a meal for her and gave her some water instructing her to take her medications minus the ones instructed to hold. She would not eat or take her meds with me there- her husband was called and he arrived at home to assist. He agreed to take her to th ER if symptoms worsened.   Home visit complete. I advised her and her husband the risks of not seeking ER care- they are aware and agreeable. Home visit complete. I will follow up once labs are drawn tomorrow.   Compliance with meds- one missed dose   Pill box filled- for one week   Refills needed- OTC Vitamin C and Prevagen   Meds changes since last visit- Torsemide increased to 4 tabs BID on 2/14   As of today holding carvedilol, spironolactone, torsemide, metolazone  until labs are drawn tomorrow.     Social changes- moms meals delivered.     BP (!) 82/0 Comment: palpated  Pulse (!) 102   Resp 16   Wt 190 lb (86.2 kg)   SpO2 96%   BMI 33.66 kg/m  Weight yesterday-didn't weigh  Last visit weight-187lbs   [name], [place]  ACTION: Home visit completed    Patient Care Team: [name], MD as PCP - General (Family Medicine) [name], LCSW as Social Worker (Licensed Clinical Social Worker) [name], [name], DO as Consulting Physician (Neurology) [name], Paramedic as Paramedic [name] [name], MD as Attending Physician (Family Medicine)  Patient Active Problem List   Diagnosis Date Noted   Elevated troponin 12/02/2021   Nausea & vomiting 12/01/2021   Acute left ankle pain 10/11/2021   Intractable nausea and vomiting 10/08/2021   AKI (acute kidney injury) ([place]) 10/08/2021   Hyponatremia 10/08/2021   Decreased estrogen level 08/01/2021   Dementia ([place]) 08/01/2021   Frail elderly 08/01/2021   Hyperparathyroidism ([place]) 08/01/2021   Spondylolisthesis 08/01/2021   Varicose veins of bilateral lower extremities with other complications [id number]   Lumbago without sciatica 04/30/2021   Abnormal gait 06/11/2020   Adult failure to thrive syndrome 06/11/2020   Allergic rhinitis 06/11/2020   Anxiety 06/11/2020   Asthma 06/11/2020   Benign intracranial hypertension 06/11/2020  Body mass index (BMI) 45.0-49.9, adult ([place]) 06/11/2020   Bowel incontinence 06/11/2020   Cholelithiasis 06/11/2020   Chronic sinusitis 06/11/2020   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   Daytime somnolence 06/11/2020   Diarrhea of presumed infectious origin 06/11/2020   Edema 06/11/2020   Family history of malignant neoplasm of gastrointestinal tract 06/11/2020   Gout 06/11/2020   History of fall 06/11/2020   History of infectious disease 06/11/2020   Insomnia 06/11/2020   Irregular bowel habits 06/11/2020    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   Lumbar spondylosis with myelopathy 06/11/2020   Macrocytosis 06/11/2020   Mild recurrent major depression ([place]) 06/11/2020   Personal history of colonic polyps 06/11/2020   Personal history of malignant neoplasm of breast 06/11/2020   Repeated falls 06/11/2020   Mixed anxiety and depressive disorder 06/11/2020   Irritable bowel syndrome 01/04/2020   Spinal stenosis of lumbar region 01/03/2020   Other symptoms and signs involving the musculoskeletal system 09/11/2019   Bilateral leg weakness 08/01/2019   Leg swelling 08/01/2019   Diabetes mellitus with neuropathy ([place]) 06/15/2019   Hypokalemia 11/08/2018   CKD (chronic kidney disease) stage 3, GFR 30-59 ml/min (HCC) 11/08/2018   Orthostatic hypotension 07/28/2017   SVD (spontaneous vaginal delivery)    Peripheral neuropathy    On home oxygen therapy    Migraines    Left bundle branch block    Hypothyroidism    Hypertension    Hyperlipidemia    Heart murmur    GERD (gastroesophageal reflux disease)    Exertional shortness of breath    Major depressive disorder    Back pain    Arthritis    Generalized anxiety disorder    Anemia    Chronic respiratory failure ([place]) 09/14/2013   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   Morbid obesity ([place]) 0000000   Chronic systolic heart failure ([place]) 10/27/2012   Endometrial polyp 01/20/2012   Malignant tumor of breast ([place]) 03/26/2011   Unspecified vitamin D deficiency 03/26/2011    Current Outpatient Medications:    acetaminophen (TYLENOL) 500 MG tablet, Take 1,000 mg by mouth daily as needed (for pain and fever of 101 or greater)., Disp: , Rfl:    acetaZOLAMIDE (DIAMOX) 125 MG tablet, Take 1 tablet (125 mg total) by mouth daily., Disp: 90 tablet, Rfl: 3   Apoaequorin (PREVAGEN EXTRA STRENGTH) 20 MG CAPS, Take 20 mg by mouth daily., Disp: , Rfl:    Ascorbic Acid (VITAMIN C) 1000 MG tablet, Take 1,000 mg by mouth  daily., Disp: , Rfl:    buPROPion (WELLBUTRIN XL) 150 MG 24 hr tablet, Take 150 mg by mouth daily., Disp: , Rfl:    carvedilol (COREG) 3.125 MG tablet, TAKE 1 TABLET (3.125 MG) BY MOUTH TWICE DAILY WITH MEALS, Disp: 180 tablet, Rfl: 3   citalopram (CELEXA) 20 MG tablet, Take 20 mg by mouth daily., Disp: , Rfl:    clonazePAM (KLONOPIN) 0.5 MG tablet, Take 0.5 mg by mouth 2 (two) times daily as needed for anxiety., Disp: , Rfl:    cyanocobalamin (VITAMIN B12) 1000 MCG tablet, Take 1,000 mcg by mouth daily., Disp: , Rfl:    dicyclomine (BENTYL) 20 MG tablet, Take 20 mg by mouth 3 (three) times daily before meals., Disp: , Rfl:    famotidine-calcium carbonate-magnesium hydroxide (PEPCID COMPLETE) 10-800-165 MG chewable tablet, Chew 1 tablet by mouth daily as needed., Disp: , Rfl:    fluticasone (FLONASE) 50 MCG/ACT nasal spray, 1 spray in each nostril, Disp: , Rfl:  gabapentin (NEURONTIN) 100 MG capsule, Take 1 capsule (100 mg total) by mouth 2 (two) times daily., Disp: 180 capsule, Rfl: 3   levothyroxine (SYNTHROID) 88 MCG tablet, TAKE 1 TABLET(88 MCG) BY MOUTH DAILY BEFORE BREAKFAST, Disp: 90 tablet, Rfl: 3   metolazone (ZAROXOLYN) 2.5 MG tablet, TAKE 1 TABLET BY MOUTH EVERY OTHER TUESDAY., Disp: 12 tablet, Rfl: 3   midodrine (PROAMATINE) 10 MG tablet, TAKE 1 TABLET THREE TIMES DAILY WITH MEALS, Disp: 270 tablet, Rfl: 3   Multiple Vitamin (MULTIVITAMIN WITH MINERALS) TABS tablet, Take 1 tablet by mouth daily., Disp: , Rfl:    ondansetron (ZOFRAN) 4 MG tablet, Take 1 tablet (4 mg total) by mouth every 6 (six) hours., Disp: 15 tablet, Rfl: 0   pantoprazole (PROTONIX) 40 MG tablet, Take 40 mg by mouth daily as needed (for acid reflux)., Disp: , Rfl:    potassium chloride SA (KLOR-CON M) 20 MEQ tablet, TAKE 2 TABLETS THREE TIMES DAILY. TAKE AN EXTRA 2 TABLETS ON DAYS YOU TAKE METOLAZONE, Disp: 572 tablet, Rfl: 0   simvastatin (ZOCOR) 10 MG tablet, TAKE 1 TABLET EVERY EVENING, Disp: 90 tablet, Rfl: 1    spironolactone (ALDACTONE) 25 MG tablet, TAKE 1 TABLET (25 MG TOTAL) BY MOUTH EVERY EVENING., Disp: 90 tablet, Rfl: 0   tiZANidine (ZANAFLEX) 2 MG tablet, Take 1 tablet (2 mg total) by mouth at bedtime as needed for muscle spasms., Disp: 90 tablet, Rfl: 1   torsemide (DEMADEX) 20 MG tablet, Take 4 tablets (80 mg total) by mouth 2 (two) times daily., Disp: 240 tablet, Rfl: 6 Allergies  Allergen Reactions   Ceftin Anaphylaxis    Face and throat swell    Geodon [Ziprasidone Hcl] Hives   Lisinopril Other (See Comments)    angioedema   Cefuroxime     Other reaction(s): anaphylaxis   Sulfacetamide Sodium-Sulfur     Other reaction(s): itch   Allopurinol Nausea Only and Other (See Comments)    weakness   Ativan [Lorazepam] Itching   Sulfa Antibiotics Itching   Valium [Diazepam] Other (See Comments)    Patient states that diazepam doesn't relax, it has the opposite effect.     Social History   Socioeconomic History   Marital status: Married    Spouse name: Not on file   Number of children: 2   Years of education: 1   Highest education level: Master's degree (e.g., MA, MS, MEng, MEd, MSW, MBA)  Occupational History   Occupation: retired  Tobacco Use   Smoking status: Some Days    Packs/day: 0.10    Years: 26.00    Total pack years: 2.60    Types: Cigarettes    Last attempt to quit: 03/23/2021    Years since quitting: 1.3   Smokeless tobacco: Never  Vaping Use   Vaping Use: Never used  Substance and Sexual Activity   Alcohol use: No   Drug use: No   Sexual activity: Yes  Other Topics Concern   Not on file  Social History Narrative   Tobacco Use Cigarettes: Former Smoker, Quit in 2008   No Alcohol   No recreational drug use   Diet: Regular/Low Carb   Exercise: None   Occupation: disabled   Education: Research officer, political party, masters   Children: 2   Firearms: No   Therapist, art Use: 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      Social Determinants of Health    Financial Resource Strain: Not  on file  Food Insecurity: No Food Insecurity (06/19/2022)   Hunger Vital Sign    Worried About Running Out of Food in the Last Year: Never true    Ran Out of Food in the Last Year: Never true  Transportation Needs: No Transportation Needs (06/19/2022)   PRAPARE - Hydrologist (Medical): No    Lack of Transportation (Non-Medical): No  Physical Activity: Not on file  Stress: Not on file  Social Connections: Not on file  Intimate Partner Violence: Not on file    Physical Exam      Future Appointments  Date Time Provider [place]  07/14/2022 12:30 PM [facility] LAB [facility] None  07/22/2022  7:00 AM [facility] DEVICE REMOTES [facility]  07/22/2022  3:15 PM [facility] LAB [facility] None  07/29/2022  2:00 PM Barbaraann Faster, RN [facility] None  09/09/2022  3:15 PM Marzetta Board, DPM [facility]  09/14/2022  7:10 AM [facility] DEVICE REMOTES [facility]  12/14/2022  7:10 AM [facility] DEVICE REMOTES [facility]  03/15/2023  7:10 AM [facility] DEVICE REMOTES [facility]  06/02/2023  1:00 PM Hazle Coca, PhD [facility] None  06/14/2023  7:10 AM [facility] DEVICE REMOTES [facility]  06/29/2023  1:30 PM Narda Amber K, DO [facility] None  09/13/2023  7:10 AM [facility] DEVICE REMOTES [facility]

## 2022-07-14 ENCOUNTER — Ambulatory Visit (HOSPITAL_COMMUNITY)
Admission: RE | Admit: 2022-07-14 | Discharge: 2022-07-14 | Disposition: A | Discharge status: Home / Self Care | Payer: Medicare HMO | Source: Ambulatory Visit | Attending: Cardiology | Admitting: Cardiology

## 2022-07-14 ENCOUNTER — Telehealth (HOSPITAL_COMMUNITY): Payer: Self-pay

## 2022-07-14 DIAGNOSIS — I5022 Chronic systolic (congestive) heart failure: Secondary | ICD-10-CM

## 2022-07-14 DIAGNOSIS — Z95810 Presence of automatic (implantable) cardiac defibrillator: Secondary | ICD-10-CM | POA: Diagnosis not present

## 2022-07-14 LAB — BASIC METABOLIC PANEL
Anion gap: 8 (ref 5–15)
BUN: 20 mg/dL (ref 8–23)
CO2: 19 mmol/L — ABNORMAL LOW (ref 22–32)
Calcium: 9.1 mg/dL (ref 8.9–10.3)
Chloride: 111 mmol/L (ref 98–111)
Creatinine, Ser: 1.32 mg/dL — ABNORMAL HIGH (ref 0.44–1.00)
GFR, Estimated: 44 mL/min — ABNORMAL LOW (ref >60)
Glucose, Bld: 121 mg/dL — ABNORMAL HIGH (ref 70–99)
Potassium: 4.2 mmol/L (ref 3.5–5.1)
Sodium: 138 mmol/L (ref 135–145)

## 2022-07-14 LAB — CBC
HCT: 34.1 % — ABNORMAL LOW (ref 36.0–46.0)
Hemoglobin: 11.4 g/dL — ABNORMAL LOW (ref 12.0–15.0)
MCH: 35.7 pg — ABNORMAL HIGH (ref 26.0–34.0)
MCHC: 33.4 g/dL (ref 30.0–36.0)
MCV: 106.9 fL — ABNORMAL HIGH (ref 80.0–100.0)
Platelets: 229 10*3/uL (ref 150–400)
RBC: 3.19 MIL/uL — ABNORMAL LOW (ref 3.87–5.11)
RDW: 14.6 % (ref 11.5–15.5)
WBC: 7.7 10*3/uL (ref 4.0–10.5)
nRBC: 0 % (ref 0.0–0.2)

## 2022-07-14 LAB — BRAIN NATRIURETIC PEPTIDE: B Natriuretic Peptide: 66.7 pg/mL (ref 0.0–100.0)

## 2022-07-14 NOTE — Telephone Encounter (Signed)
Called and discussed lab notes from [place] NP on 2/20 for Rhonda [name] with Rhonda [name] her husband- Rhonda [name] knows to reach out to PCP for follow up. I will request a lab appointment for her for next Wednesday as that day in the afternoon works best for Rhonda [name] to bring her in.   Caitlynn denied any blood in stool or vomiting any blood. I will keep a close follow up on her.   [name], [place] 07/14/2022

## 2022-07-15 ENCOUNTER — Encounter: Payer: Medicare HMO | Admitting: Psychology

## 2022-07-15 ENCOUNTER — Other Ambulatory Visit (HOSPITAL_COMMUNITY): Payer: Self-pay | Admitting: Physician Assistant

## 2022-07-15 ENCOUNTER — Other Ambulatory Visit (HOSPITAL_COMMUNITY): Payer: Self-pay

## 2022-07-15 DIAGNOSIS — D649 Anemia, unspecified: Secondary | ICD-10-CM

## 2022-07-16 DIAGNOSIS — I700 Atherosclerosis of aorta: Secondary | ICD-10-CM | POA: Diagnosis not present

## 2022-07-16 DIAGNOSIS — D638 Anemia in other chronic diseases classified elsewhere: Secondary | ICD-10-CM | POA: Diagnosis not present

## 2022-07-16 DIAGNOSIS — N1832 Chronic kidney disease, stage 3b: Secondary | ICD-10-CM | POA: Diagnosis not present

## 2022-07-16 DIAGNOSIS — E1140 Type 2 diabetes mellitus with diabetic neuropathy, unspecified: Secondary | ICD-10-CM | POA: Diagnosis not present

## 2022-07-16 DIAGNOSIS — F339 Major depressive disorder, recurrent, unspecified: Secondary | ICD-10-CM | POA: Diagnosis not present

## 2022-07-16 DIAGNOSIS — E1122 Type 2 diabetes mellitus with diabetic chronic kidney disease: Secondary | ICD-10-CM | POA: Diagnosis not present

## 2022-07-20 ENCOUNTER — Other Ambulatory Visit (HOSPITAL_COMMUNITY): Payer: Self-pay
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
## 2022-07-22 ENCOUNTER — Ambulatory Visit (HOSPITAL_COMMUNITY)
Admission: RE | Admit: 2022-07-22 | Discharge: 2022-07-22 | Disposition: A | Discharge status: Home / Self Care | Payer: Medicare HMO | Source: Ambulatory Visit | Attending: Internal Medicine | Admitting: Internal Medicine

## 2022-07-22 DIAGNOSIS — D649 Anemia, unspecified: Secondary | ICD-10-CM

## 2022-07-22 DIAGNOSIS — I5022 Chronic systolic (congestive) heart failure: Secondary | ICD-10-CM | POA: Insufficient documentation

## 2022-07-22 DIAGNOSIS — Q359 Cleft palate, unspecified: Secondary | ICD-10-CM | POA: Diagnosis not present

## 2022-07-22 DIAGNOSIS — J029 Acute pharyngitis, unspecified: Secondary | ICD-10-CM | POA: Diagnosis not present

## 2022-07-22 LAB — BASIC METABOLIC PANEL
Anion gap: 9 (ref 5–15)
BUN: 20 mg/dL (ref 8–23)
CO2: 23 mmol/L (ref 22–32)
Calcium: 9 mg/dL (ref 8.9–10.3)
Chloride: 104 mmol/L (ref 98–111)
Creatinine, Ser: 1.57 mg/dL — ABNORMAL HIGH (ref 0.44–1.00)
GFR, Estimated: 35 mL/min — ABNORMAL LOW (ref >60)
Glucose, Bld: 129 mg/dL — ABNORMAL HIGH (ref 70–99)
Potassium: 3.8 mmol/L (ref 3.5–5.1)
Sodium: 136 mmol/L (ref 135–145)

## 2022-07-22 LAB — CBC WITH DIFFERENTIAL/PLATELET
Abs Immature Granulocytes: 0.04 10*3/uL (ref 0.00–0.07)
Basophils Absolute: 0 10*3/uL (ref 0.0–0.1)
Basophils Relative: 0 %
Eosinophils Absolute: 0.3 10*3/uL (ref 0.0–0.5)
Eosinophils Relative: 4 %
HCT: 37.4 % (ref 36.0–46.0)
Hemoglobin: 12.6 g/dL (ref 12.0–15.0)
Immature Granulocytes: 1 %
Lymphocytes Relative: 19 %
Lymphs Abs: 1.4 10*3/uL (ref 0.7–4.0)
MCH: 36.1 pg — ABNORMAL HIGH (ref 26.0–34.0)
MCHC: 33.7 g/dL (ref 30.0–36.0)
MCV: 107.2 fL — ABNORMAL HIGH (ref 80.0–100.0)
Monocytes Absolute: 0.5 10*3/uL (ref 0.1–1.0)
Monocytes Relative: 8 %
Neutro Abs: 5 10*3/uL (ref 1.7–7.7)
Neutrophils Relative %: 68 %
Platelets: 296 10*3/uL (ref 150–400)
RBC: 3.49 MIL/uL — ABNORMAL LOW (ref 3.87–5.11)
RDW: 14.3 % (ref 11.5–15.5)
WBC: 7.2 10*3/uL (ref 4.0–10.5)
nRBC: 0 % (ref 0.0–0.2)

## 2022-07-22 NOTE — Addendum Note (Signed)
Encounter addended by: Shonna Chock, CMA on: [id number] 12:53 PM  Actions taken: Visit diagnoses modified, Order list changed, Diagnosis association updated

## 2022-07-23 ENCOUNTER — Other Ambulatory Visit: Payer: Medicare HMO

## 2022-07-23 VITALS — BP 92/60 | HR 84 | Temp 99.0°F

## 2022-07-23 DIAGNOSIS — Z515 Encounter for palliative care: Secondary | ICD-10-CM

## 2022-07-23 NOTE — Progress Notes (Signed)
PATIENT NAME: Rhonda [name] DOB: 08-Nov-1950 MRN: [id number]  PRIMARY CARE PROVIDER: [name], MD  RESPONSIBLE PARTY:  Acct ID - Guarantor Home Phone Work Phone Relationship Acct Type  [id number] SONI, [phone]  Self P/F     [place], [address] [place], [place] [address]   Palliative Care Initial Encounter Note   LPN completed home visit.  Husband Rhonda [name]  also present.     HISTORY OF PRESENT ILLNESS:    Respiratory: no issues at this time; gets SOB with ambulation and will rest when needed  Cardiac: CHF   Cognitive: alert and oriented; husband reports some confusion and memory lapse   Appetite: gets Meals for Mom; reports she doesn't have a good appetite but will eat the meals; eats 3 reduced meals; drinks 4-6 cups daily  GI/GU: has been having diarrhea off and on lately; no issues today   Mobility: ambulates with a cane throughout the house and outside of the house; when she ambulates for a long period she will stop and rest or sit until she feels rested enough to walk again  ADLs: able to complete ADL's independently   Sleeping Pattern: sleeps through the night  Pain: 8/10 throat pain level; pt reports she has a sore throat frequently 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 Clayborne Dana office rep comes to the home weekly to fill the pt's pill box  Palliative Care/ Hospice: RN explained role and purpose of palliative care including visit frequency. Also discussed benefits hospice care as well as the differences between the two with patient.   Goals of Care: To stay in the home with her husband as long as possible    CODE STATUS: Full Code ADVANCED DIRECTIVES: Y MOST FORM: Y PPS: 70%   PHYSICAL EXAM:   VITALS: Today's Vitals   07/23/22 1004  BP: 92/60  Pulse: 84  Temp: 99 F (37.2 C)  SpO2: 98%    LUNGS: clear to auscultation  CARDIAC: Cor RRR EXTREMITIES: normal SKIN: Skin color, texture, turgor normal. No  rashes or lesions  NEURO: negative      Rhonda [name] Rhonda Housekeeper, LPN

## 2022-07-24 NOTE — Progress Notes (Signed)
No ICM remote transmission received for 07/22/2022 and next ICM transmission scheduled for 08/10/2022.

## 2022-07-24 NOTE — Progress Notes (Signed)
PREP class start 04/21/22 end date: 08/19/22 Patient attended 5 total classes, 3 educational classes

## 2022-07-26 ENCOUNTER — Emergency Department (HOSPITAL_COMMUNITY): Payer: Medicare HMO

## 2022-07-26 ENCOUNTER — Emergency Department (HOSPITAL_COMMUNITY)
Admission: EM | Admit: 2022-07-26 | Discharge: 2022-07-27 | Disposition: A | Discharge status: Other Acute Inpatient Hospital | Payer: Medicare HMO | Attending: Emergency Medicine | Admitting: Emergency Medicine

## 2022-07-26 ENCOUNTER — Encounter (HOSPITAL_COMMUNITY): Payer: Self-pay

## 2022-07-26 DIAGNOSIS — Z1152 Encounter for screening for COVID-19: Secondary | ICD-10-CM | POA: Diagnosis not present

## 2022-07-26 DIAGNOSIS — E1122 Type 2 diabetes mellitus with diabetic chronic kidney disease: Secondary | ICD-10-CM | POA: Diagnosis not present

## 2022-07-26 DIAGNOSIS — F17210 Nicotine dependence, cigarettes, uncomplicated: Secondary | ICD-10-CM | POA: Diagnosis not present

## 2022-07-26 DIAGNOSIS — Z96651 Presence of right artificial knee joint: Secondary | ICD-10-CM | POA: Diagnosis not present

## 2022-07-26 DIAGNOSIS — Z95810 Presence of automatic (implantable) cardiac defibrillator: Secondary | ICD-10-CM | POA: Insufficient documentation

## 2022-07-26 DIAGNOSIS — N183 Chronic kidney disease, stage 3 unspecified: Secondary | ICD-10-CM | POA: Insufficient documentation

## 2022-07-26 DIAGNOSIS — R45851 Suicidal ideations: Secondary | ICD-10-CM | POA: Diagnosis not present

## 2022-07-26 DIAGNOSIS — F332 Major depressive disorder, recurrent severe without psychotic features: Secondary | ICD-10-CM | POA: Diagnosis not present

## 2022-07-26 DIAGNOSIS — Z853 Personal history of malignant neoplasm of breast: Secondary | ICD-10-CM | POA: Insufficient documentation

## 2022-07-26 DIAGNOSIS — J45909 Unspecified asthma, uncomplicated: Secondary | ICD-10-CM | POA: Insufficient documentation

## 2022-07-26 DIAGNOSIS — I5022 Chronic systolic (congestive) heart failure: Secondary | ICD-10-CM | POA: Insufficient documentation

## 2022-07-26 DIAGNOSIS — I10 Essential (primary) hypertension: Secondary | ICD-10-CM | POA: Diagnosis not present

## 2022-07-26 DIAGNOSIS — R4182 Altered mental status, unspecified: Secondary | ICD-10-CM | POA: Insufficient documentation

## 2022-07-26 DIAGNOSIS — I130 Hypertensive heart and chronic kidney disease with heart failure and stage 1 through stage 4 chronic kidney disease, or unspecified chronic kidney disease: Secondary | ICD-10-CM | POA: Insufficient documentation

## 2022-07-26 LAB — CBC
HCT: 39.5 % (ref 36.0–46.0)
Hemoglobin: 13.4 g/dL (ref 12.0–15.0)
MCH: 36.2 pg — ABNORMAL HIGH (ref 26.0–34.0)
MCHC: 33.9 g/dL (ref 30.0–36.0)
MCV: 106.8 fL — ABNORMAL HIGH (ref 80.0–100.0)
Platelets: 360 10*3/uL (ref 150–400)
RBC: 3.7 MIL/uL — ABNORMAL LOW (ref 3.87–5.11)
RDW: 13.7 % (ref 11.5–15.5)
WBC: 9.3 10*3/uL (ref 4.0–10.5)
nRBC: 0 % (ref 0.0–0.2)

## 2022-07-26 LAB — URINALYSIS, COMPLETE (UACMP) WITH MICROSCOPIC
Bacteria, UA: NONE SEEN
Bilirubin Urine: NEGATIVE
Glucose, UA: NEGATIVE mg/dL
Hgb urine dipstick: NEGATIVE
Ketones, ur: NEGATIVE mg/dL
Nitrite: NEGATIVE
Protein, ur: NEGATIVE mg/dL
Specific Gravity, Urine: 1.01 (ref 1.005–1.030)
pH: 5 (ref 5.0–8.0)

## 2022-07-26 LAB — COMPREHENSIVE METABOLIC PANEL
ALT: 16 U/L (ref 0–44)
AST: 23 U/L (ref 15–41)
Albumin: 3.9 g/dL (ref 3.5–5.0)
Alkaline Phosphatase: 155 U/L — ABNORMAL HIGH (ref 38–126)
Anion gap: 12 (ref 5–15)
BUN: 25 mg/dL — ABNORMAL HIGH (ref 8–23)
CO2: 22 mmol/L (ref 22–32)
Calcium: 9.7 mg/dL (ref 8.9–10.3)
Chloride: 99 mmol/L (ref 98–111)
Creatinine, Ser: 2.02 mg/dL — ABNORMAL HIGH (ref 0.44–1.00)
GFR, Estimated: 26 mL/min — ABNORMAL LOW (ref >60)
Glucose, Bld: 124 mg/dL — ABNORMAL HIGH (ref 70–99)
Potassium: 4.6 mmol/L (ref 3.5–5.1)
Sodium: 133 mmol/L — ABNORMAL LOW (ref 135–145)
Total Bilirubin: 0.6 mg/dL (ref 0.3–1.2)
Total Protein: 7.8 g/dL (ref 6.5–8.1)

## 2022-07-26 LAB — TSH: TSH: 2.398 u[IU]/mL (ref 0.350–4.500)

## 2022-07-26 LAB — ETHANOL: Alcohol, Ethyl (B): <10 mg/dL (ref <10)

## 2022-07-26 LAB — RAPID URINE DRUG SCREEN, HOSP PERFORMED
Amphetamines: NOT DETECTED
Barbiturates: NOT DETECTED
Benzodiazepines: NOT DETECTED
Cocaine: NOT DETECTED
Opiates: NOT DETECTED
Tetrahydrocannabinol: NOT DETECTED

## 2022-07-26 LAB — ACETAMINOPHEN LEVEL
Acetaminophen (Tylenol), Serum: 33 ug/mL — ABNORMAL HIGH (ref 10–30)
Acetaminophen (Tylenol), Serum: 11 ug/mL (ref 10–30)

## 2022-07-26 LAB — SALICYLATE LEVEL: Salicylate Lvl: <7 mg/dL — ABNORMAL LOW (ref 7.0–30.0)

## 2022-07-26 MED ORDER — DICYCLOMINE HCL 20 MG PO TABS
20.0000 mg | ORAL_TABLET | Freq: Three times a day (TID) | ORAL | Status: DC
  Administered 2022-07-27 (×3): 20 mg via ORAL
  Filled 2022-07-26 (×3): qty 1

## 2022-07-26 MED ORDER — ACETAMINOPHEN 500 MG PO TABS: 1000.0000 mg | ORAL_TABLET | Freq: Every day | ORAL | Status: DC | PRN

## 2022-07-26 MED ORDER — TORSEMIDE 20 MG PO TABS
80.0000 mg | ORAL_TABLET | Freq: Two times a day (BID) | ORAL | Status: DC
  Administered 2022-07-26 – 2022-07-27 (×2): 80 mg via ORAL
  Filled 2022-07-26 (×2): qty 4

## 2022-07-26 MED ORDER — SPIRONOLACTONE 25 MG PO TABS
25.0000 mg | ORAL_TABLET | Freq: Every day | ORAL | Status: DC
  Administered 2022-07-26 – 2022-07-27 (×2): 25 mg via ORAL
  Filled 2022-07-26: qty 2
  Filled 2022-07-26: qty 1

## 2022-07-26 MED ORDER — CITALOPRAM HYDROBROMIDE 10 MG PO TABS
20.0000 mg | ORAL_TABLET | Freq: Every day | ORAL | Status: DC
  Administered 2022-07-26 – 2022-07-27 (×2): 20 mg via ORAL
  Filled 2022-07-26 (×2): qty 2

## 2022-07-26 MED ORDER — BUPROPION HCL ER (XL) 150 MG PO TB24
150.0000 mg | ORAL_TABLET | Freq: Every day | ORAL | Status: DC
  Administered 2022-07-27: 150 mg via ORAL
  Filled 2022-07-26: qty 1

## 2022-07-26 MED ORDER — LEVOTHYROXINE SODIUM 88 MCG PO TABS
88.0000 ug | ORAL_TABLET | Freq: Every day | ORAL | Status: DC
  Administered 2022-07-27: 88 ug via ORAL
  Filled 2022-07-26: qty 1

## 2022-07-26 MED ORDER — SIMVASTATIN 20 MG PO TABS
10.0000 mg | ORAL_TABLET | Freq: Every evening | ORAL | Status: DC
  Administered 2022-07-26: 10 mg via ORAL
  Filled 2022-07-26: qty 1

## 2022-07-26 MED ORDER — CARVEDILOL 3.125 MG PO TABS
3.1250 mg | ORAL_TABLET | Freq: Two times a day (BID) | ORAL | Status: DC
  Administered 2022-07-27 (×2): 3.125 mg via ORAL
  Filled 2022-07-26 (×2): qty 1

## 2022-07-26 MED ORDER — CLONAZEPAM 0.5 MG PO TABS: 0.5000 mg | ORAL_TABLET | Freq: Two times a day (BID) | ORAL | Status: DC | PRN

## 2022-07-26 NOTE — ED Notes (Signed)
Pt's belongings locked in locker 3.
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
## 2022-07-26 NOTE — ED Notes (Signed)
Security at bedside wanding patient

## 2022-07-26 NOTE — ED Notes (Signed)
Patient provided paper scrubs as no purple scrubs were available in patient's size. Drink and snack provided by this tech.

## 2022-07-26 NOTE — ED Notes (Signed)
Patient provided phone numbers of daughters.  Daughter Denny Peon: [phone]  Daughter Sarah: [phone]

## 2022-07-27 ENCOUNTER — Other Ambulatory Visit: Payer: Self-pay

## 2022-07-27 ENCOUNTER — Encounter: Payer: Self-pay | Admitting: Nurse Practitioner

## 2022-07-27 ENCOUNTER — Inpatient Hospital Stay
Admission: AD | Admit: 2022-07-27 | Discharge: 2022-08-06 | DRG: 885 | Disposition: A | Discharge status: Home / Self Care | Payer: Medicare HMO | Source: Intra-hospital | Attending: Psychiatry | Admitting: Psychiatry

## 2022-07-27 DIAGNOSIS — Z8249 Family history of ischemic heart disease and other diseases of the circulatory system: Secondary | ICD-10-CM | POA: Diagnosis not present

## 2022-07-27 DIAGNOSIS — J45909 Unspecified asthma, uncomplicated: Secondary | ICD-10-CM | POA: Diagnosis not present

## 2022-07-27 DIAGNOSIS — E039 Hypothyroidism, unspecified: Secondary | ICD-10-CM | POA: Diagnosis not present

## 2022-07-27 DIAGNOSIS — E785 Hyperlipidemia, unspecified: Secondary | ICD-10-CM | POA: Diagnosis present

## 2022-07-27 DIAGNOSIS — E1142 Type 2 diabetes mellitus with diabetic polyneuropathy: Secondary | ICD-10-CM | POA: Diagnosis present

## 2022-07-27 DIAGNOSIS — Z87730 Personal history of (corrected) cleft lip and palate: Secondary | ICD-10-CM | POA: Diagnosis not present

## 2022-07-27 DIAGNOSIS — Z79899 Other long term (current) drug therapy: Secondary | ICD-10-CM

## 2022-07-27 DIAGNOSIS — Z823 Family history of stroke: Secondary | ICD-10-CM | POA: Diagnosis not present

## 2022-07-27 DIAGNOSIS — I130 Hypertensive heart and chronic kidney disease with heart failure and stage 1 through stage 4 chronic kidney disease, or unspecified chronic kidney disease: Secondary | ICD-10-CM | POA: Diagnosis not present

## 2022-07-27 DIAGNOSIS — Z882 Allergy status to sulfonamides status: Secondary | ICD-10-CM | POA: Diagnosis not present

## 2022-07-27 DIAGNOSIS — I428 Other cardiomyopathies: Secondary | ICD-10-CM | POA: Diagnosis not present

## 2022-07-27 DIAGNOSIS — Z9221 Personal history of antineoplastic chemotherapy: Secondary | ICD-10-CM | POA: Diagnosis not present

## 2022-07-27 DIAGNOSIS — Z853 Personal history of malignant neoplasm of breast: Secondary | ICD-10-CM

## 2022-07-27 DIAGNOSIS — J9610 Chronic respiratory failure, unspecified whether with hypoxia or hypercapnia: Secondary | ICD-10-CM | POA: Diagnosis present

## 2022-07-27 DIAGNOSIS — I5022 Chronic systolic (congestive) heart failure: Secondary | ICD-10-CM | POA: Diagnosis not present

## 2022-07-27 DIAGNOSIS — F411 Generalized anxiety disorder: Secondary | ICD-10-CM | POA: Diagnosis present

## 2022-07-27 DIAGNOSIS — Z881 Allergy status to other antibiotic agents status: Secondary | ICD-10-CM

## 2022-07-27 DIAGNOSIS — Z923 Personal history of irradiation: Secondary | ICD-10-CM | POA: Diagnosis not present

## 2022-07-27 DIAGNOSIS — Z96651 Presence of right artificial knee joint: Secondary | ICD-10-CM | POA: Diagnosis present

## 2022-07-27 DIAGNOSIS — Z79890 Hormone replacement therapy (postmenopausal): Secondary | ICD-10-CM

## 2022-07-27 DIAGNOSIS — Z9981 Dependence on supplemental oxygen: Secondary | ICD-10-CM

## 2022-07-27 DIAGNOSIS — R45851 Suicidal ideations: Secondary | ICD-10-CM | POA: Diagnosis present

## 2022-07-27 DIAGNOSIS — F332 Major depressive disorder, recurrent severe without psychotic features: Secondary | ICD-10-CM | POA: Insufficient documentation

## 2022-07-27 DIAGNOSIS — F17210 Nicotine dependence, cigarettes, uncomplicated: Secondary | ICD-10-CM | POA: Diagnosis present

## 2022-07-27 DIAGNOSIS — R4182 Altered mental status, unspecified: Secondary | ICD-10-CM | POA: Diagnosis not present

## 2022-07-27 DIAGNOSIS — R413 Other amnesia: Secondary | ICD-10-CM | POA: Diagnosis present

## 2022-07-27 DIAGNOSIS — F329 Major depressive disorder, single episode, unspecified: Secondary | ICD-10-CM | POA: Diagnosis present

## 2022-07-27 DIAGNOSIS — E1122 Type 2 diabetes mellitus with diabetic chronic kidney disease: Secondary | ICD-10-CM | POA: Diagnosis present

## 2022-07-27 DIAGNOSIS — Z888 Allergy status to other drugs, medicaments and biological substances status: Secondary | ICD-10-CM

## 2022-07-27 DIAGNOSIS — Z820 Family history of epilepsy and other diseases of the nervous system: Secondary | ICD-10-CM

## 2022-07-27 DIAGNOSIS — N183 Chronic kidney disease, stage 3 unspecified: Secondary | ICD-10-CM | POA: Diagnosis not present

## 2022-07-27 DIAGNOSIS — Z95810 Presence of automatic (implantable) cardiac defibrillator: Secondary | ICD-10-CM | POA: Diagnosis not present

## 2022-07-27 DIAGNOSIS — R454 Irritability and anger: Secondary | ICD-10-CM | POA: Diagnosis present

## 2022-07-27 LAB — RESP PANEL BY RT-PCR (RSV, FLU A&B, COVID)  RVPGX2
Influenza A by PCR: NEGATIVE
Influenza B by PCR: NEGATIVE
Resp Syncytial Virus by PCR: NEGATIVE
SARS Coronavirus 2 by RT PCR: NEGATIVE

## 2022-07-27 MED ORDER — QUETIAPINE FUMARATE 50 MG PO TABS: 50.0000 mg | ORAL_TABLET | Freq: Every day | ORAL | Status: DC

## 2022-07-27 MED ORDER — LEVOTHYROXINE SODIUM 88 MCG PO TABS
88.0000 ug | ORAL_TABLET | Freq: Every day | ORAL | Status: DC
  Administered 2022-07-29 – 2022-08-06 (×9): 88 ug via ORAL
  Filled 2022-07-27 (×10): qty 1

## 2022-07-27 MED ORDER — ADULT MULTIVITAMIN W/MINERALS CH
1.0000 | ORAL_TABLET | Freq: Every day | ORAL | Status: DC
  Administered 2022-07-27 – 2022-08-06 (×11): 1 via ORAL
  Filled 2022-07-27 (×11): qty 1

## 2022-07-27 MED ORDER — QUETIAPINE FUMARATE 25 MG PO TABS
50.0000 mg | ORAL_TABLET | Freq: Every day | ORAL | Status: DC
  Administered 2022-07-27 – 2022-08-05 (×10): 50 mg via ORAL
  Filled 2022-07-27 (×10): qty 2

## 2022-07-27 MED ORDER — ACETAMINOPHEN 325 MG PO TABS: 650.0000 mg | ORAL_TABLET | Freq: Four times a day (QID) | ORAL | Status: DC | PRN

## 2022-07-27 MED ORDER — BUPROPION HCL ER (XL) 150 MG PO TB24
150.0000 mg | ORAL_TABLET | Freq: Every day | ORAL | Status: DC
  Administered 2022-07-28 – 2022-08-06 (×10): 150 mg via ORAL
  Filled 2022-07-27 (×10): qty 1

## 2022-07-27 MED ORDER — MAGNESIUM HYDROXIDE 400 MG/5ML PO SUSP: 30.0000 mL | Freq: Every day | ORAL | Status: DC | PRN

## 2022-07-27 MED ORDER — HALOPERIDOL LACTATE 5 MG/ML IJ SOLN: 5.0000 mg | Freq: Three times a day (TID) | INTRAMUSCULAR | Status: DC | PRN

## 2022-07-27 MED ORDER — GABAPENTIN 100 MG PO CAPS
100.0000 mg | ORAL_CAPSULE | Freq: Two times a day (BID) | ORAL | Status: DC
  Administered 2022-07-27 – 2022-08-06 (×20): 100 mg via ORAL
  Filled 2022-07-27 (×20): qty 1

## 2022-07-27 MED ORDER — PANTOPRAZOLE SODIUM 40 MG PO TBEC: 40.0000 mg | DELAYED_RELEASE_TABLET | Freq: Every day | ORAL | Status: DC | PRN

## 2022-07-27 MED ORDER — SPIRONOLACTONE 25 MG PO TABS
25.0000 mg | ORAL_TABLET | Freq: Every day | ORAL | Status: DC
  Administered 2022-07-28 – 2022-08-06 (×9): 25 mg via ORAL
  Filled 2022-07-27 (×10): qty 1

## 2022-07-27 MED ORDER — ALUM & MAG HYDROXIDE-SIMETH 200-200-20 MG/5ML PO SUSP: 30.0000 mL | ORAL | Status: DC | PRN

## 2022-07-27 MED ORDER — LORAZEPAM 1 MG PO TABS
1.0000 mg | ORAL_TABLET | ORAL | Status: DC | PRN
  Administered 2022-08-04 (×2): 1 mg via ORAL
  Filled 2022-07-27 (×2): qty 1

## 2022-07-27 MED ORDER — OLANZAPINE 5 MG PO TABS
5.0000 mg | ORAL_TABLET | Freq: Four times a day (QID) | ORAL | Status: DC | PRN
  Administered 2022-07-30: 5 mg via ORAL
  Filled 2022-07-27 (×2): qty 1

## 2022-07-27 MED ORDER — CARVEDILOL 6.25 MG PO TABS
3.1250 mg | ORAL_TABLET | Freq: Two times a day (BID) | ORAL | Status: DC
  Administered 2022-07-28 – 2022-08-06 (×18): 3.125 mg via ORAL
  Filled 2022-07-27 (×19): qty 1

## 2022-07-27 MED ORDER — CLONAZEPAM 0.5 MG PO TABS
0.5000 mg | ORAL_TABLET | Freq: Two times a day (BID) | ORAL | Status: DC | PRN
  Administered 2022-07-27 – 2022-07-29 (×2): 0.5 mg via ORAL
  Filled 2022-07-27 (×2): qty 1

## 2022-07-27 MED ORDER — DICYCLOMINE HCL 20 MG PO TABS
20.0000 mg | ORAL_TABLET | Freq: Three times a day (TID) | ORAL | Status: DC
  Administered 2022-07-28 – 2022-08-06 (×28): 20 mg via ORAL
  Filled 2022-07-27 (×30): qty 1

## 2022-07-27 MED ORDER — HALOPERIDOL 5 MG PO TABS: 5.0000 mg | ORAL_TABLET | Freq: Three times a day (TID) | ORAL | Status: DC | PRN

## 2022-07-27 MED ORDER — TORSEMIDE 20 MG PO TABS
80.0000 mg | ORAL_TABLET | Freq: Two times a day (BID) | ORAL | Status: DC
  Administered 2022-07-28 – 2022-08-06 (×17): 80 mg via ORAL
  Filled 2022-07-27 (×21): qty 4

## 2022-07-27 MED ORDER — SIMVASTATIN 20 MG PO TABS
10.0000 mg | ORAL_TABLET | Freq: Every evening | ORAL | Status: DC
  Administered 2022-07-27 – 2022-08-05 (×10): 10 mg via ORAL
  Filled 2022-07-27 (×10): qty 1

## 2022-07-27 MED ORDER — ACETAMINOPHEN 325 MG PO TABS
650.0000 mg | ORAL_TABLET | Freq: Four times a day (QID) | ORAL | Status: DC | PRN
  Administered 2022-07-28 – 2022-08-02 (×10): 650 mg via ORAL
  Filled 2022-07-27 (×12): qty 2

## 2022-07-27 MED ORDER — VITAMIN C 500 MG PO TABS
1000.0000 mg | ORAL_TABLET | Freq: Every day | ORAL | Status: DC
  Administered 2022-07-27 – 2022-08-06 (×11): 1000 mg via ORAL
  Filled 2022-07-27 (×11): qty 2

## 2022-07-27 MED ORDER — FLUTICASONE PROPIONATE 50 MCG/ACT NA SUSP
1.0000 | Freq: Every day | NASAL | Status: DC
  Administered 2022-07-27 – 2022-08-06 (×11): 1 via NASAL
  Filled 2022-07-27: qty 16

## 2022-07-27 MED ORDER — CITALOPRAM HYDROBROMIDE 20 MG PO TABS
20.0000 mg | ORAL_TABLET | Freq: Every day | ORAL | Status: DC
  Administered 2022-07-28 – 2022-08-06 (×10): 20 mg via ORAL
  Filled 2022-07-27 (×10): qty 1

## 2022-07-27 MED ORDER — TRAZODONE HCL 100 MG PO TABS
100.0000 mg | ORAL_TABLET | Freq: Every evening | ORAL | Status: DC | PRN
  Administered 2022-07-28 – 2022-07-31 (×4): 100 mg via ORAL
  Filled 2022-07-27 (×5): qty 1

## 2022-07-27 NOTE — ED Notes (Signed)
[facility] paperwork status checked
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
## 2022-07-27 NOTE — Progress Notes (Signed)
Pt was accepted to [facility] Unit TODAY 07/27/2022, pending negative covid PCR, respiratory panel, and IVC paperwork faxed to [phone]. Bed assignment: 30  Pt meets inpatient criteria per Vesta Mixer, NP  Attending Physician will be Caren Griffins, DO  Report can be called to: [phone]  Pt can arrive after pending items are received  Care Team Notified: [facility] Lynnda Shields, RN, Pennelope Bracken, RN, Vesta Mixer, NP, Maxie Better, RN, Sammuel Bailiff, RN, and Caren Griffins, DO  [place], [place]  07/27/2022 12:04 PM

## 2022-07-27 NOTE — ED Notes (Signed)
All belongings given to [facility] officers. Pt transported out of ED to go to [facility] geropsych unit.

## 2022-07-27 NOTE — Group Note (Deleted)
[place] LCSW Group Therapy Note    Group Date: 07/27/2022 Start Time:  1:00 PM End Time:  1:40 PM  Type of Therapy and Topic:  Group Therapy:  Overcoming Obstacles  Participation Level:  {[facility] PARTICIPATION LEVEL:22264:::1}  Mood:  Description of Group:   In this group patients will be encouraged to explore what they see as obstacles to their own wellness and recovery. They will be guided to discuss their thoughts, feelings, and behaviors related to these obstacles. The group will process together ways to cope with barriers, with attention given to specific choices patients can make. Each patient will be challenged to identify changes they are motivated to make in order to overcome their obstacles. This group will be process-oriented, with patients participating in exploration of their own experiences as well as giving and receiving support and challenge from other group members.  Therapeutic Goals: 1. Patient will identify personal and current obstacles as they relate to admission. 2. Patient will identify barriers that currently interfere with their wellness or overcoming obstacles.  3. Patient will identify feelings, thought process and behaviors related to these barriers. 4. Patient will identify two changes they are willing to make to overcome these obstacles:    Summary of Patient Progress   ***   Therapeutic Modalities:   Cognitive Behavioral Therapy Solution Focused Therapy Motivational Interviewing Relapse Prevention Therapy   Hulda Reddix A [place], LCSWA

## 2022-07-27 NOTE — Plan of Care (Signed)
  Problem: Education: Goal: Knowledge of General Education information will improve Description: Including pain rating scale, medication(s)/side effects and non-pharmacologic comfort measures Outcome: Progressing   Problem: Coping: Goal: Level of anxiety will decrease Outcome: Progressing   Problem: Safety: Goal: Ability to remain free from injury will improve Outcome: Progressing   
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
## 2022-07-27 NOTE — BH Assessment (Signed)
Clinician message [name], RN and Sherryll Burger. Ronny Flurry, RN: "Hey. It's Trey with TTS. Is the pt able to engage in the assessment, if so the pt will need to be placed in a private room. Is the pt under IVC? Also is the pt medically cleared?"   Clinician awaiting response.    Vertell Novak, [place], [facility], [facility] Triage Specialist [phone]
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
## 2022-07-27 NOTE — Group Note (Signed)
Recreation Therapy Group Note   Group Topic:General Recreation  Group Date: 07/27/2022 Start Time:  2:00 PM End Time:  2:45 PM Facilitators: Vilma Prader, LRT, CTRS Location: Courtyard  Group Description: Rummy. LRT and patients played the card game "Rummy" outside while getting fresh air and sunlight, with music playing in the background. LRT and pts discussed how they have or have not ever played card games, where they use to play, and who they use to play with. LRT an pts discussed how this could be a leisure interest post-discharge.   Affect/Mood: N/A   Participation Level: Did not attend    Clinical Observations/Individualized Feedback: Pt did not attend group due to not being on the unit yet.    Vilma Prader, LRT, CTRS 07/27/2022 2:56 PM
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
## 2022-07-27 NOTE — Plan of Care (Signed)
  Problem: Education: Goal: Knowledge of General Education information will improve Description: Including pain rating scale, medication(s)/side effects and non-pharmacologic comfort measures Outcome: Not Progressing   Problem: Health Behavior/Discharge Planning: Goal: Ability to manage health-related needs will improve Outcome: Not Progressing   Problem: Clinical Measurements: Goal: Ability to maintain clinical measurements within normal limits will improve Outcome: Not Progressing   Problem: Activity: Goal: Risk for activity intolerance will decrease Outcome: Not Progressing   Problem: Elimination: Goal: Will not experience complications related to bowel motility Outcome: Not Progressing Goal: Will not experience complications related to urinary retention Outcome: Not Progressing   Problem: Pain Managment: Goal: General experience of comfort will improve Outcome: Not Progressing   Problem: Safety: Goal: Periods of time without injury will increase Outcome: Not Progressing   Problem: Physical Regulation: Goal: Ability to maintain clinical measurements within normal limits will improve Outcome: Not Progressing   Problem: Health Behavior/Discharge Planning: Goal: Identification of resources available to assist in meeting health care needs will improve Outcome: Not Progressing Goal: Compliance with treatment plan for underlying cause of condition will improve Outcome: Not Progressing

## 2022-07-27 NOTE — ED Provider Notes (Signed)
Emergency Medicine Observation Re-evaluation Note  Rhonda [name] is a 72 y.o. female, seen on rounds today.  Pt initially presented to the ED for complaints of IVC Currently, the patient is under IVC and plan placement at [facility].  Physical Exam  BP 121/66 (BP Location: Right Arm)   Pulse 87   Temp 99.6 F (37.6 C) (Oral)   Resp 17   SpO2 97%  Physical Exam General: Elderly female sitting in the bed does not appear to be in acute distress Cardiac: Regular rate and rhythm Lungs: No respiratory distress oxygen saturations 97% Psych: Patient has been depressed and has been expressing suicidality  ED Course / MDM  EKG:EKG Interpretation  Date/Time:  Sunday July 26 2022 15:47:42 EST Ventricular Rate:  80 PR Interval:  158 QRS Duration: 148 QT Interval:  442 QTC Calculation: 509 R Axis:   178 Text Interpretation: Atrial-sensed ventricular-paced rhythm Biventricular pacemaker detected Abnormal ECG When compared with ECG of 25-Jun-2022 14:18, PREVIOUS ECG IS PRESENT Confirmed by Wandra Arthurs [phone]) on 07/26/2022 4:23:16 PM  I have reviewed the labs performed to date as well as medications administered while in observation.  Recent changes in the last 24 hours include patient was seen here in the ED and evaluated with labs.  She has chronic creatinine elevation.  She has been hemodynamically stable here in the ED.Marland Kitchen  Plan  Current plan is for patient has placement planned in geriatric psychiatric unit.  She has had COVID obtained and is negative.Pattricia Boss, MD 07/27/22 [phone]

## 2022-07-27 NOTE — ED Notes (Signed)
Called  sheriff to tx pt to [facility] they are aware and will be in route shortly

## 2022-07-28 DIAGNOSIS — F332 Major depressive disorder, recurrent severe without psychotic features: Secondary | ICD-10-CM

## 2022-07-28 NOTE — Progress Notes (Signed)
Assumed care of patient at 1900, pt in dayroom watching tv and minimal socializing with other patients. Pt was calm and cooperative, alert and oriented x4. Pt denied having any suicidal or homicidal thoughts. Pt was focused on her medication list stating that she did not receive the right medications while she was in the emergency room. Pt called husband and left message for him to bring a new medication list. Pt was compliant with the medications that were ordered, however she did not want to take Torsemide due to it being a diuretic and she did not want to be up all night using the restroom. Pt was also complaining of it being too cold in her room and was given extra blankets. Pt has since appeared to be sleeping rest of shift.

## 2022-07-28 NOTE — H&P (Signed)
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 Gait & Station: unsteady Patient leans: N/A            Psychiatric Specialty Exam:  Presentation  General Appearance:  Appropriate for Environment  Eye Contact: Good  Speech: Clear and Coherent  Speech Volume: Normal  Handedness:No data recorded  Mood and Affect  Mood: Anxious  Affect: Congruent   Thought Process  Thought  Processes: Coherent; Goal Directed  Duration of Psychotic Symptoms:N/A Past Diagnosis of Schizophrenia or Psychoactive disorder: No  Descriptions of Associations:Intact  Orientation:Partial  Thought Content:WDL  Hallucinations:Hallucinations: None  Ideas of Reference:None  Suicidal Thoughts:Suicidal Thoughts: No  Homicidal Thoughts:Homicidal Thoughts: No   Sensorium  Memory: Immediate Fair; Recent Fair  Judgment: Fair  Insight: Poor   Executive Functions  Concentration: Fair  Attention Span: Fair  Recall: [place] of Knowledge: Good  Language: Good   Psychomotor Activity  Psychomotor Activity: Psychomotor Activity: Normal   Assets  Assets: Leisure Time; Physical Health; Resilience   Sleep  Sleep: Sleep: Fair    Physical Exam: Physical Exam Constitutional:      Appearance: Normal appearance.  HENT:     Head: Normocephalic and atraumatic.     Mouth/Throat:     Pharynx: Oropharynx is clear.  Eyes:     Pupils: Pupils are equal, round, and reactive to light.  Cardiovascular:     Rate and Rhythm: Normal rate and regular rhythm.  Pulmonary:     Effort: Pulmonary effort is normal.     Breath sounds: Normal breath sounds.  Abdominal:     General: Abdomen is flat.     Palpations: Abdomen is soft.  Musculoskeletal:        General: Normal range of motion.  Skin:    General: Skin is warm and dry.  Neurological:     General: No focal deficit present.     Mental Status: She is alert. Mental status is at baseline.  Psychiatric:        Attention and Perception: Attention and perception normal.        Mood and Affect: Mood and affect normal.        Speech: Speech is tangential.        Behavior: Behavior is cooperative.        Thought Content: Thought content normal.        Cognition and Memory: Cognition  and memory normal.        Judgment: Judgment normal.    Review of Systems  Constitutional: Negative.   HENT: Negative.    Eyes:  Negative.   Respiratory: Negative.    Cardiovascular: Negative.   Gastrointestinal: Negative.   Genitourinary: Negative.   Musculoskeletal: Negative.   Skin: Negative.   Neurological: Negative.   Endo/Heme/Allergies: Negative.   Psychiatric/Behavioral: Negative.     Blood pressure 115/85, pulse (!) 106, temperature 98.8 F (37.1 C), temperature source Oral, resp. rate 18, height 5' (1.524 m), weight 83 kg, SpO2 96 %. Body mass index is 35.74 kg/m.  Treatment Plan Summary: Daily contact with patient to assess and evaluate symptoms and progress in treatment, Medication management, and Plan continue current medications and social work is going to research as to who is prescribing her psychiatric medications.  Observation Level/Precautions:  15 minute checks  Laboratory:  CBC Chemistry Profile  Psychotherapy:    Medications:    Consultations:    Discharge Concerns:    Estimated LOS:  Other:     Physician Treatment Plan for Primary Diagnosis: MDD (major depressive disorder), recurrent severe, without psychosis ([place]) Long Term Goal(s): Improvement in symptoms so as ready for discharge  Short Term Goals: Ability to identify changes in lifestyle to reduce recurrence of condition will improve, Ability to verbalize feelings will improve, Ability to disclose and discuss suicidal ideas, Ability to demonstrate self-control will improve, Ability to identify and develop effective coping behaviors will improve, Ability to maintain clinical measurements within normal limits will improve, Compliance with prescribed medications will improve, and Ability to identify triggers associated with substance abuse/mental health issues will improve  Physician Treatment Plan for Secondary Diagnosis: Principal Problem:   MDD (major depressive disorder), recurrent severe, without psychosis ([place]) Active Problems:   MDD (major depressive disorder)    I certify that inpatient services furnished can reasonably  be expected to improve the patient's condition.    Parks Ranger, DO 3/5/202410:51 AM
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
## 2022-07-28 NOTE — Progress Notes (Signed)
Patient is alert and oriented times 4. Mood and affect anxious. Patient rates pain as 5/10 to lower back. She denies SI, HI, and AVH. Also denies feelings of depression at this time. States she slept good last night. Morning meds given whole by mouth W/O difficulty. Ate breakfast in day room- appetite good. Patient remains on unit with Q15 minute checks in place.
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
## 2022-07-28 NOTE — BH Assessment (Addendum)
1910 Received patient sitting in the day room visiting with her daughter. She is engaged in the verbal interaction.  2010 Patient is noted lying in bed resting with eyes opened reading her Bible. She is pleasant and cooperative, affect is bright and her facial muscles are relaxed.  2105 Patient has a phone call from her husband and she has a 7 minute conversation that she pleasantly engages in. Interactions appear to be pleasant; no negative changes in patient's demeanor. Will continue to monitor patient for safety.   2250 Patient requested Trazodone because she said the that she is not able to sleep without taking something. After reviewing her chart patient was given Trazodone 100 mg for insomnia.   0200 Patient continues to resting quietly in bed . Will continue to monitor patient for further insomnia, and safety.  0620 Patient rested quietly in bed until 0610, she is alert and oriented x4, no distress noted. Will continue to monitor patient for safety. ]0615 Patient complaint of having Gout in her feet and requested and received Tylenol 650 mg for 8/10 pain.

## 2022-07-28 NOTE — Plan of Care (Signed)
  Problem: Education: Goal: Knowledge of General Education information will improve Description: Including pain rating scale, medication(s)/side effects and non-pharmacologic comfort measures Outcome: Progressing   Problem: Health Behavior/Discharge Planning: Goal: Ability to manage health-related needs will improve Outcome: Progressing   Problem: Clinical Measurements: Goal: Ability to maintain clinical measurements within normal limits will improve Outcome: Progressing Goal: Will remain free from infection Outcome: Progressing Goal: Diagnostic test results will improve Outcome: Progressing Goal: Respiratory complications will improve Outcome: Progressing Goal: Cardiovascular complication will be avoided Outcome: Progressing   Problem: Activity: Goal: Risk for activity intolerance will decrease Outcome: Progressing   Problem: Nutrition: Goal: Adequate nutrition will be maintained Outcome: Progressing   Problem: Coping: Goal: Level of anxiety will decrease Outcome: Progressing   Problem: Safety: Goal: Periods of time without injury will increase Outcome: Progressing   Problem: Physical Regulation: Goal: Ability to maintain clinical measurements within normal limits will improve Outcome: Progressing   Problem: Health Behavior/Discharge Planning: Goal: Identification of resources available to assist in meeting health care needs will improve Outcome: Progressing Goal: Compliance with treatment plan for underlying cause of condition will improve Outcome: Progressing

## 2022-07-29 ENCOUNTER — Ambulatory Visit: Payer: Self-pay | Admitting: *Deleted

## 2022-07-29 DIAGNOSIS — F332 Major depressive disorder, recurrent severe without psychotic features: Secondary | ICD-10-CM | POA: Diagnosis not present

## 2022-07-29 MED ORDER — COLCHICINE 0.6 MG PO TABS
0.6000 mg | ORAL_TABLET | Freq: Two times a day (BID) | ORAL | Status: DC
  Administered 2022-07-29 – 2022-08-06 (×17): 0.6 mg via ORAL
  Filled 2022-07-29 (×18): qty 1

## 2022-07-29 NOTE — Patient Outreach (Signed)
  Care Coordination   07/29/2022 Name: Rhonda [name] MRN: [id number] DOB: December 11, 1950   Care Coordination Outreach Attempts:  An unsuccessful telephone outreach was attempted for a scheduled appointment today.  Follow Up Plan:  Additional outreach attempts will be made to offer the patient care coordination information and services.   Encounter Outcome:  No Answer   Care Coordination Interventions:  No, not indicated    Lanyia Jewel L. Lavina Hamman, RN, BSN, [place] Coordinator Office number [phone]

## 2022-07-29 NOTE — BH IP Treatment Plan (Signed)
Interdisciplinary Treatment and Diagnostic Plan Update  07/29/2022 Time of Session: 11AM Rhonda [name] MRN: [id number]  Principal Diagnosis: MDD (major depressive disorder), recurrent severe, without psychosis ([place])  Secondary Diagnoses: Principal Problem:   MDD (major depressive disorder), recurrent severe, without psychosis ([place]) Active Problems:   MDD (major depressive disorder)   Current Medications:  Current Facility-Administered Medications  Medication Dose Route Frequency Provider Last Rate Last Admin   acetaminophen (TYLENOL) tablet 650 mg  650 mg Oral Q6H PRN Parks Ranger, DO   650 mg at 07/29/22 1254   alum & mag hydroxide-simeth (MAALOX/MYLANTA) 200-200-20 MG/5ML suspension 30 mL  30 mL Oral Q4H PRN Parks Ranger, DO       ascorbic acid (VITAMIN C) tablet 1,000 mg  1,000 mg Oral Daily Parks Ranger, DO   1,000 mg at 07/29/22 0910   buPROPion (WELLBUTRIN XL) 24 hr tablet 150 mg  150 mg Oral Daily Parks Ranger, DO   150 mg at 07/29/22 [id number]   carvedilol (COREG) tablet 3.125 mg  3.125 mg Oral BID WC Parks Ranger, DO   3.125 mg at 07/29/22 [id number]   citalopram (CELEXA) tablet 20 mg  20 mg Oral Daily Parks Ranger, DO   20 mg at 07/29/22 [id number]   clonazePAM (KLONOPIN) tablet 0.5 mg  0.5 mg Oral BID PRN Parks Ranger, DO   0.5 mg at 07/27/22 2101   colchicine tablet 0.6 mg  0.6 mg Oral BID Parks Ranger, DO   0.6 mg at 07/29/22 1508   dicyclomine (BENTYL) tablet 20 mg  20 mg Oral TID AC Parks Ranger, DO   20 mg at 07/29/22 1254   fluticasone (FLONASE) 50 MCG/ACT nasal spray 1 spray  1 spray Each Nare Daily Parks Ranger, DO   1 spray at 07/29/22 [id number]   gabapentin (NEURONTIN) capsule 100 mg  100 mg Oral BID Parks Ranger, DO   100 mg at 07/29/22 [id number]   haloperidol (HALDOL) tablet 5 mg  5 mg Oral TID PRN Vesta Mixer, NP       Or   haloperidol lactate (HALDOL) injection 5 mg   5 mg Intramuscular TID PRN Vesta Mixer, NP       levothyroxine (SYNTHROID) tablet 88 mcg  88 mcg Oral Q0600 Parks Ranger, DO   88 mcg at 07/29/22 [id number]   LORazepam (ATIVAN) tablet 1 mg  1 mg Oral Q4H PRN Parks Ranger, DO       magnesium hydroxide (MILK OF MAGNESIA) suspension 30 mL  30 mL Oral Daily PRN Parks Ranger, DO       multivitamin with minerals tablet 1 tablet  1 tablet Oral Daily Parks Ranger, DO   1 tablet at 07/29/22 0910   OLANZapine (ZYPREXA) tablet 5 mg  5 mg Oral Q6H PRN Parks Ranger, DO       pantoprazole (PROTONIX) EC tablet 40 mg  40 mg Oral Daily PRN Parks Ranger, DO       QUEtiapine (SEROQUEL) tablet 50 mg  50 mg Oral QHS Parks Ranger, DO   50 mg at 07/28/22 2204   simvastatin (ZOCOR) tablet 10 mg  10 mg Oral QPM Parks Ranger, DO   10 mg at 07/28/22 1647   spironolactone (ALDACTONE) tablet 25 mg  25 mg Oral Daily Parks Ranger, DO   25 mg at 07/29/22 0910   torsemide (DEMADEX) tablet 80 mg  80 mg Oral BID  Parks Ranger, DO   80 mg at 07/28/22 1647   traZODone (DESYREL) tablet 100 mg  100 mg Oral QHS PRN Parks Ranger, DO   100 mg at 07/28/22 2302   PTA Medications: Medications Prior to Admission  Medication Sig Dispense Refill Last Dose   acetaminophen (TYLENOL) 500 MG tablet Take 1,000 mg by mouth daily as needed (for pain and fever of 101 or greater).      acetaZOLAMIDE (DIAMOX) 125 MG tablet Take 1 tablet (125 mg total) by mouth daily. 90 tablet 3    Ascorbic Acid (VITAMIN C) 1000 MG tablet Take 1,000 mg by mouth daily.      buPROPion (WELLBUTRIN XL) 150 MG 24 hr tablet Take 150 mg by mouth daily.      carvedilol (COREG) 3.125 MG tablet TAKE 1 TABLET (3.125 MG) BY MOUTH TWICE DAILY WITH MEALS (Patient taking differently: Take 3.125 mg by mouth 2 (two) times daily with a meal. TAKE 1 TABLET (3.125 MG) BY MOUTH TWICE DAILY WITH MEALS) 180 tablet 3     citalopram (CELEXA) 20 MG tablet Take 20 mg by mouth daily.      clonazePAM (KLONOPIN) 0.5 MG tablet Take 0.5 mg by mouth 2 (two) times daily as needed for anxiety.      cyanocobalamin (VITAMIN B12) 1000 MCG tablet Take 1,000 mcg by mouth daily.      dicyclomine (BENTYL) 20 MG tablet Take 20 mg by mouth 3 (three) times daily before meals.      fluticasone (FLONASE) 50 MCG/ACT nasal spray Place 1 spray into both nostrils daily.      gabapentin (NEURONTIN) 100 MG capsule Take 1 capsule (100 mg total) by mouth 2 (two) times daily. 180 capsule 3    levothyroxine (SYNTHROID) 88 MCG tablet TAKE 1 TABLET(88 MCG) BY MOUTH DAILY BEFORE BREAKFAST (Patient taking differently: Take 88 mcg by mouth daily before breakfast. TAKE 1 TABLET(88 MCG) BY MOUTH DAILY BEFORE BREAKFAST) 90 tablet 3    metolazone (ZAROXOLYN) 2.5 MG tablet TAKE 1 TABLET BY MOUTH EVERY OTHER TUESDAY. (Patient taking differently: Take 2.5 mg by mouth. Take 1 tablet by mouth every other Tuesday.) 12 tablet 3    midodrine (PROAMATINE) 10 MG tablet TAKE 1 TABLET THREE TIMES DAILY WITH MEALS (Patient taking differently: Take 10 mg by mouth 3 (three) times daily. TAKE 1 TABLET THREE TIMES DAILY WITH MEALS) 270 tablet 3    Multiple Vitamin (MULTIVITAMIN WITH MINERALS) TABS tablet Take 1 tablet by mouth daily.      nystatin (MYCOSTATIN) 100000 UNIT/ML suspension Take 5 mLs by mouth 4 (four) times daily.      ondansetron (ZOFRAN) 4 MG tablet Take 1 tablet (4 mg total) by mouth every 6 (six) hours. (Patient not taking: Reported on 07/20/2022) 15 tablet 0    pantoprazole (PROTONIX) 40 MG tablet Take 40 mg by mouth daily as needed (for acid reflux).      potassium chloride SA (KLOR-CON M) 20 MEQ tablet TAKE 2 TABLETS THREE TIMES DAILY. TAKE AN EXTRA 2 TABLETS ON DAYS YOU TAKE METOLAZONE (Patient taking differently: Take 40 mEq by mouth 3 (three) times daily.) 572 tablet 3    simvastatin (ZOCOR) 10 MG tablet TAKE 1 TABLET EVERY EVENING (Patient taking  differently: Take 10 mg by mouth daily at 6 PM.) 90 tablet 1    spironolactone (ALDACTONE) 25 MG tablet TAKE 1 TABLET (25 MG TOTAL) BY MOUTH EVERY EVENING. (Patient taking differently: Take 25 mg by mouth daily.) 90 tablet  0    tiZANidine (ZANAFLEX) 2 MG tablet Take 1 tablet (2 mg total) by mouth at bedtime as needed for muscle spasms. 90 tablet 1    torsemide (DEMADEX) 20 MG tablet Take 4 tablets (80 mg total) by mouth 2 (two) times daily. 240 tablet 6     Patient Stressors: Marital or family conflict   Substance abuse   Other: husband's substance abuse, not patient and husband not fixing the house.    Patient Strengths: Active sense of humor  Communication skills  Supportive family/friends   Treatment Modalities: Medication Management, Group therapy, Case management,  1 to 1 session with clinician, Psychoeducation, Recreational therapy.   Physician Treatment Plan for Primary Diagnosis: MDD (major depressive disorder), recurrent severe, without psychosis ([place]) Long Term Goal(s): Improvement in symptoms so as ready for discharge   Short Term Goals: Ability to identify changes in lifestyle to reduce recurrence of condition will improve Ability to verbalize feelings will improve Ability to disclose and discuss suicidal ideas Ability to demonstrate self-control will improve Ability to identify and develop effective coping behaviors will improve Ability to maintain clinical measurements within normal limits will improve Compliance with prescribed medications will improve Ability to identify triggers associated with substance abuse/mental health issues will improve  Medication Management: Evaluate patient's response, side effects, and tolerance of medication regimen.  Therapeutic Interventions: 1 to 1 sessions, Unit Group sessions and Medication administration.  Evaluation of Outcomes: Not Met  Physician Treatment Plan for Secondary Diagnosis: Principal Problem:   MDD (major  depressive disorder), recurrent severe, without psychosis ([place]) Active Problems:   MDD (major depressive disorder)  Long Term Goal(s): Improvement in symptoms so as ready for discharge   Short Term Goals: Ability to identify changes in lifestyle to reduce recurrence of condition will improve Ability to verbalize feelings will improve Ability to disclose and discuss suicidal ideas Ability to demonstrate self-control will improve Ability to identify and develop effective coping behaviors will improve Ability to maintain clinical measurements within normal limits will improve Compliance with prescribed medications will improve Ability to identify triggers associated with substance abuse/mental health issues will improve     Medication Management: Evaluate patient's response, side effects, and tolerance of medication regimen.  Therapeutic Interventions: 1 to 1 sessions, Unit Group sessions and Medication administration.  Evaluation of Outcomes: Not Met   RN Treatment Plan for Primary Diagnosis: MDD (major depressive disorder), recurrent severe, without psychosis ([place]) Long Term Goal(s): Knowledge of disease and therapeutic regimen to maintain health will improve  Short Term Goals: Ability to remain free from injury will improve, Ability to verbalize frustration and anger appropriately will improve, Ability to demonstrate self-control, Ability to participate in decision making will improve, Ability to verbalize feelings will improve, Ability to disclose and discuss suicidal ideas, Ability to identify and develop effective coping behaviors will improve, and Compliance with prescribed medications will improve  Medication Management: RN will administer medications as ordered by provider, will assess and evaluate patient's response and provide education to patient for prescribed medication. RN will report any adverse and/or side effects to prescribing provider.  Therapeutic Interventions: 1 on 1  counseling sessions, Psychoeducation, Medication administration, Evaluate responses to treatment, Monitor vital signs and CBGs as ordered, Perform/monitor CIWA, COWS, AIMS and Fall Risk screenings as ordered, Perform wound care treatments as ordered.  Evaluation of Outcomes: Not Met   LCSW Treatment Plan for Primary Diagnosis: MDD (major depressive disorder), recurrent severe, without psychosis ([place]) Long Term Goal(s): Safe transition to appropriate next level  of care at discharge, Engage patient in therapeutic group addressing interpersonal concerns.  Short Term Goals: Engage patient in aftercare planning with referrals and resources, Increase social support, Increase ability to appropriately verbalize feelings, Increase emotional regulation, Facilitate acceptance of mental health diagnosis and concerns, and Increase skills for wellness and recovery  Therapeutic Interventions: Assess for all discharge needs, 1 to 1 time with Social worker, Explore available resources and support systems, Assess for adequacy in community support network, Educate family and significant other(s) on suicide prevention, Complete Psychosocial Assessment, Interpersonal group therapy.  Evaluation of Outcomes: Not Met   Progress in Treatment: Attending groups: Yes. Participating in groups: Yes. Taking medication as prescribed: Yes. Toleration medication: Yes. Family/Significant other contact made: Yes, individual(s) contacted:  SPE completed with pt's husband, Nieve Stitely Patient understands diagnosis: Yes. Discussing patient identified problems/goals with staff: Yes. Medical problems stabilized or resolved: Yes. Denies suicidal/homicidal ideation: Yes. Issues/concerns per patient self-inventory: No. Other: None  New problem(s) identified: No, Describe:  None  New Short Term/Long Term Goal(s): Patient to work towards elimination of  medication management for mood stabilization development of comprehensive  mental wellness plan.   Patient Goals:  "ready to go home"  Discharge Plan or Barriers: CSW will assist pt with development of appropriate discharge/aftercare plan.   Reason for Continuation of Hospitalization: Depression Medication stabilization  Estimated Length of Stay: 1-7 days  Last 3 [place] Suicide Severity Risk Score: Flowsheet Row Admission (Current) from 07/27/2022 in [place] ED from 07/26/2022 in [facility] Emergency Department [facility] ED from 06/25/2022 in [facility] Emergency Department at [place] No Risk No Risk No Risk       Last PHQ 2/9 Scores:     No data to display          Scribe for Treatment Team: Xan Sparkman A [place], [place] 07/29/2022 3:21 PM

## 2022-07-29 NOTE — Progress Notes (Signed)
Patient is A+O x 3, disoriented to situation. Patient continuously asked when she would receive her medications after having just been adm them. Explanation and reorientation provided. She denies SI/HI/AVH. Appetite fair. Medications adm whole without any issues.  Tylenol given at 1255 for 10/10 pain in right foot. Patient repositioned and presented with distraction techniques. Emotional support provided. Colchicine 0.6 mg added and adm for s/s of gout. Patient made and received phone calls from family. Daughter visited and brought warm clothes for patient.  Q15 minute unit checks in place.

## 2022-07-29 NOTE — Group Note (Signed)
Recreation Therapy Group Note   Group Topic:Emotion Expression  Group Date: 07/29/2022 Start Time: 1400 End Time: 1445 Facilitators: Vilma Prader, LRT, CTRS Location:  Dayroom  Group Description: Expressive Arts Post Card. Patients received a blank postcard template. LRT encouraged pt to create a postcard to their future self by using words and pictures drawn with markers. LRT and pts discussed what they want their futures to look like and the things that they want to achieve.  Affect/Mood: Appropriate and Congruent   Participation Level: Active and Engaged   Participation Quality: Independent   Behavior: Alert, Appropriate, and Calm   Speech/Thought Process: Coherent   Insight: Good   Judgement: Good   Modes of Intervention: Art and Worksheet   Patient Response to Interventions:  Attentive, Engaged, Interested , and Receptive   Education Outcome:  Acknowledges education   Clinical Observations/Individualized Feedback: Rhonda [name] was active in their participation of session activities and group discussion. Pt identified "[place] because one of my close friends lives there. Her husband is in the service and tells me that I should visit." Pt was pleasant and interacted well with LRT and peers. Pt drew a picture of herself with her dog that is a Yorkie.    Plan: Continue to engage patient in RT group sessions 2-3x/week.   Vilma Prader, LRT, CTRS 07/29/2022 3:25 PM

## 2022-07-29 NOTE — Progress Notes (Signed)
[facility] MD Progress Note  07/29/2022 12:56 PM Rhonda [name]  MRN:  [id number] Subjective: Rhonda [name] is seen on rounds.  She complains of toe pain and apparently takes colchicine at home so I will restart that.  It is not in her review list.  She slept well last night.  She is pleasant and cooperative.  She has been in touch with her family.  Nurses report no issues.  She denies all signs and symptoms.  Principal Problem: MDD (major depressive disorder), recurrent severe, without psychosis ([place]) Diagnosis: Principal Problem:   MDD (major depressive disorder), recurrent severe, without psychosis ([place]) Active Problems:   MDD (major depressive disorder)  Total Time spent with patient: 15 minutes  Past Psychiatric History: Depressive disorder secondary to general medical condition.  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    Complication of anesthesia    History of low blood pressure after surgery; attributed to lying flat   Diabetes mellitus    "diet controlled" (05/03/2013)   Exertional shortness of breath    Generalized anxiety disorder    GERD (gastroesophageal reflux disease)    Gout    Heart murmur    Hyperlipidemia    Hypertension    Hypokalemia 11/08/2018   Hypothyroidism    Left bundle branch block    s/p CRT-D (04/2013)   Major depressive disorder    Migraines    On home oxygen therapy    "2L suppose to be q night" (05/03/2013)   Orthostatic hypotension 07/28/2017   Peripheral  neuropathy    Feet   SVD (spontaneous vaginal delivery)    x 2   Unspecified vitamin D deficiency 03/26/2011   Does not take meds    Past Surgical History:  Procedure Laterality Date   BI-VENTRICULAR IMPLANTABLE CARDIOVERTER DEFIBRILLATOR N/A 05/03/2013   Procedure: BI-VENTRICULAR IMPLANTABLE CARDIOVERTER DEFIBRILLATOR  (CRT-D);  Surgeon: Evans Lance, MD;  Location[facility] CATH LAB;  Service: Cardiovascular;  Laterality: N/A;   BI-VENTRICULAR IMPLANTABLE CARDIOVERTER DEFIBRILLATOR  (CRT-D)  05/03/2013   MDT CRTD implanted by Dr Lovena Le for non ischemic cardiomyopathy   BIOPSY  05/31/2018   Procedure: BIOPSY;  Surgeon: Ronnette Juniper, MD;  Location: [facility];  Service: Gastroenterology;;   [place] N/A 04/11/2020   Procedure: [place];  Surgeon: Evans Lance, MD;  Location: [place] CV LAB;  Service: Cardiovascular;  Laterality: N/A;   BREAST LUMPECTOMY Left 07/28/2010   CARDIAC CATHETERIZATION  2010   NORMAL CORONARY ARTERIES   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--11 SURGERIES     PT HAS REMOVABLE SPEECH BULB-TAKES IT OUT BEFORE HER SURGERY   COLONOSCOPY     COLONOSCOPY N/A 05/31/2018   Procedure: COLONOSCOPY;  Surgeon: Ronnette Juniper, MD;  Location: [facility] ENDOSCOPY;  Service: Gastroenterology;  Laterality: N/A;   HYSTEROSCOPY WITH D & C  01/20/2012   Procedure: DILATATION AND CURETTAGE /HYSTEROSCOPY;  Surgeon:  Margarette Asal, MD;  Location: [facility];  Service: Gynecology;  Laterality: N/A;  with trueclear   PORT-A-CATH REMOVAL  09/23/2011   Procedure: REMOVAL PORT-A-CATH;  Surgeon: Rolm Bookbinder, MD;  Location: [facility];  Service: General;  Laterality: N/A;  Port Removal   PORTACATH PLACEMENT  2012   TONSILLECTOMY  1960's   TOTAL KNEE ARTHROPLASTY  05/10/2012   Procedure: TOTAL KNEE ARTHROPLASTY;  Surgeon: Mauri Pole, MD;  Location: [facility];  Service: Orthopedics;  Laterality: Right;   WISDOM TOOTH EXTRACTION     Family History:  Family History   Problem Relation Age of Onset   Alzheimer's disease Mother    CVA Mother    Hypertension Mother    Cancer - Colon Father        late 67   Birth defects Paternal Uncle    Family Psychiatric  History: Unremarkable Social History:  Social History   Substance and Sexual Activity  Alcohol Use No     Social History   Substance and Sexual Activity  Drug Use No    Social History   Socioeconomic History   Marital status: Married    Spouse name: Not on file   Number of children: 2   Years of education: 18   Highest education level: Master's degree (e.g., MA, MS, MEng, MEd, MSW, MBA)  Occupational History   Occupation: retired  Tobacco Use   Smoking status: Some Days    Packs/day: 0.10    Years: 26.00    Total pack years: 2.60    Types: Cigarettes    Last attempt to quit: 03/23/2021    Years since quitting: 1.3   Smokeless tobacco: Never  Vaping Use   Vaping Use: Never used  Substance and Sexual Activity   Alcohol use: No   Drug use: No   Sexual activity: Yes  Other Topics Concern   Not on file  Social History Narrative   Tobacco Use Cigarettes: Former Smoker, Quit in 2008   No Alcohol   No recreational drug use   Diet: Regular/Low Carb   Exercise: None   Occupation: disabled   Education: Research officer, political party, masters   Children: 2   Firearms: No   Therapist, art Use: 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      Social Determinants of Health   Financial Resource Strain: Not on file  Food Insecurity: No Food Insecurity (07/27/2022)   Hunger Vital Sign    Worried About Running Out of Food in the Last Year: Never true    Ran Out of Food in the Last Year: Never true  Transportation Needs: No Transportation Needs (07/27/2022)   PRAPARE - Hydrologist (Medical): No    Lack of Transportation (Non-Medical): No  Physical Activity: Not on file  Stress: Not on file  Social Connections: Not on file   Additional Social History:                          Sleep: Good  Appetite:  Good  Current Medications: Current Facility-Administered Medications  Medication Dose Route Frequency Provider Last Rate Last Admin   acetaminophen (TYLENOL) tablet 650 mg  650 mg Oral Q6H PRN Parks Ranger, DO   650 mg at 07/29/22 1254   alum & mag hydroxide-simeth (MAALOX/MYLANTA) 200-200-20 MG/5ML suspension 30 mL  30 mL Oral Q4H PRN Parks Ranger, DO  ascorbic acid (VITAMIN C) tablet 1,000 mg  1,000 mg Oral Daily Parks Ranger, DO   1,000 mg at 07/29/22 [id number]   buPROPion (WELLBUTRIN XL) 24 hr tablet 150 mg  150 mg Oral Daily Parks Ranger, DO   150 mg at 07/29/22 [id number]   carvedilol (COREG) tablet 3.125 mg  3.125 mg Oral BID WC Parks Ranger, DO   3.125 mg at 07/29/22 [id number]   citalopram (CELEXA) tablet 20 mg  20 mg Oral Daily Parks Ranger, DO   20 mg at 07/29/22 [id number]   clonazePAM (KLONOPIN) tablet 0.5 mg  0.5 mg Oral BID PRN Parks Ranger, DO   0.5 mg at 07/27/22 2101   colchicine tablet 0.6 mg  0.6 mg Oral BID Parks Ranger, DO       dicyclomine (BENTYL) tablet 20 mg  20 mg Oral TID AC Parks Ranger, DO   20 mg at 07/29/22 1254   fluticasone (FLONASE) 50 MCG/ACT nasal spray 1 spray  1 spray Each Nare Daily Parks Ranger, DO   1 spray at 07/29/22 [id number]   gabapentin (NEURONTIN) capsule 100 mg  100 mg Oral BID Parks Ranger, DO   100 mg at 07/29/22 [id number]   haloperidol (HALDOL) tablet 5 mg  5 mg Oral TID PRN Vesta Mixer, NP       Or   haloperidol lactate (HALDOL) injection 5 mg  5 mg Intramuscular TID PRN Vesta Mixer, NP       levothyroxine (SYNTHROID) tablet 88 mcg  88 mcg Oral Q0600 Parks Ranger, DO   88 mcg at 07/29/22 [id number]   LORazepam (ATIVAN) tablet 1 mg  1 mg Oral Q4H PRN Parks Ranger, DO       magnesium hydroxide (MILK OF MAGNESIA) suspension 30 mL  30 mL Oral Daily PRN Parks Ranger, DO       multivitamin with minerals tablet 1 tablet  1 tablet Oral Daily Parks Ranger, DO   1 tablet at 07/29/22 0910   OLANZapine (ZYPREXA) tablet 5 mg  5 mg Oral Q6H PRN Parks Ranger, DO       pantoprazole (PROTONIX) EC tablet 40 mg  40 mg Oral Daily PRN Parks Ranger, DO       QUEtiapine (SEROQUEL) tablet 50 mg  50 mg Oral QHS Parks Ranger, DO   50 mg at 07/28/22 2204   simvastatin (ZOCOR) tablet 10 mg  10 mg Oral QPM Parks Ranger, DO   10 mg at 07/28/22 1647   spironolactone (ALDACTONE) tablet 25 mg  25 mg Oral Daily Parks Ranger, DO   25 mg at 07/29/22 0910   torsemide (DEMADEX) tablet 80 mg  80 mg Oral BID Parks Ranger, DO   80 mg at 07/28/22 1647   traZODone (DESYREL) tablet 100 mg  100 mg Oral QHS PRN Parks Ranger, DO   100 mg at 07/28/22 2302    Lab Results: No results found. However, due to the size of the patient record, not all encounters were searched. Please check Results Review for a complete set of results.  Blood Alcohol level:  Lab Results  Component Value Date   [facility] <10 07/26/2022   ETH <10 [id number]    Metabolic Disorder Labs: Lab Results  Component Value Date   HGBA1C 7.0 (H) 10/09/2021   MPG 154.2 10/09/2021   MPG 151.33 10/08/2021   No results found for: "PROLACTIN" Lab Results  Component Value Date   CHOL  08/05/2009    163        ATP III CLASSIFICATION:  <200     mg/dL   Desirable  200-239  mg/dL   Borderline High  >=240    mg/dL   High          TRIG 149 08/05/2009   HDL 41 08/05/2009   CHOLHDL 4.0 08/05/2009   VLDL 30 08/05/2009   LDLCALC  08/05/2009    92        Total Cholesterol/HDL:CHD Risk Coronary Heart Disease Risk Table                     Men   Women  1/2 Average Risk   3.4   3.3  Average Risk       5.0   4.4  2 X Average Risk   9.6   7.1  3 X Average Risk  23.4   11.0        Use the calculated Patient Ratio above and the CHD Risk  Table to determine the patient's CHD Risk.        ATP III CLASSIFICATION (LDL):  <100     mg/dL   Optimal  100-129  mg/dL   Near or Above                    Optimal  130-159  mg/dL   Borderline  160-189  mg/dL   High  >190     mg/dL   Very High   LDLCALC (H) 11/02/2008    161        Total Cholesterol/HDL:CHD Risk Coronary Heart Disease Risk Table                     Men   Women  1/2 Average Risk   3.4   3.3  Average Risk       5.0   4.4  2 X Average Risk   9.6   7.1  3 X Average Risk  23.4   11.0        Use the calculated Patient Ratio above and the CHD Risk Table to determine the patient's CHD Risk.        ATP III CLASSIFICATION (LDL):  <100     mg/dL   Optimal  100-129  mg/dL   Near or Above                    Optimal  130-159  mg/dL   Borderline  160-189  mg/dL   High  >190     mg/dL   Very High    Physical Findings: AIMS:  , ,  ,  ,    CIWA:    COWS:     Musculoskeletal: Strength & Muscle Tone: within normal limits Gait & Station: normal Patient leans: N/A  Psychiatric Specialty Exam:  Presentation  General Appearance:  Appropriate for Environment  Eye Contact: Good  Speech: Clear and Coherent  Speech Volume: Normal  Handedness:No data recorded  Mood and Affect  Mood: Anxious  Affect: Congruent   Thought Process  Thought Processes: Coherent; Goal Directed  Descriptions of Associations:Intact  Orientation:Partial  Thought Content:WDL  History of Schizophrenia/Schizoaffective disorder:No  Duration of Psychotic Symptoms:No data recorded Hallucinations:No data recorded Ideas of Reference:None  Suicidal Thoughts:No data recorded Homicidal Thoughts:No data recorded  Sensorium  Memory: Immediate Fair; Recent Fair  Judgment: Fair  Insight: Poor  Executive Functions  Concentration: Fair  Attention Span: Fair  Recall: Smiley Houseman of Knowledge: Good  Language: Good   Psychomotor Activity  Psychomotor  Activity:No data recorded  Assets  Assets: Leisure Time; Physical Health; Resilience   Sleep  Sleep:No data recorded   Physical Exam: Physical Exam Vitals and nursing note reviewed.  Constitutional:      Appearance: Normal appearance. She is normal weight.  Neurological:     General: No focal deficit present.     Mental Status: She is alert and oriented to person, place, and time.  Psychiatric:        Attention and Perception: Attention and perception normal.        Mood and Affect: Mood normal. Affect is flat.        Speech: Speech is delayed and tangential.        Behavior: Behavior normal. Behavior is cooperative.        Thought Content: Thought content is paranoid.        Cognition and Memory: Memory normal. Cognition is impaired.        Judgment: Judgment is inappropriate.    Review of Systems  Constitutional: Negative.   HENT: Negative.    Eyes: Negative.   Respiratory: Negative.    Cardiovascular: Negative.   Gastrointestinal: Negative.   Genitourinary: Negative.   Musculoskeletal: Negative.   Skin: Negative.   Neurological: Negative.   Endo/Heme/Allergies: Negative.   Psychiatric/Behavioral:  Positive for memory loss.    Blood pressure 117/79, pulse 89, temperature 98.4 F (36.9 C), temperature source Oral, resp. rate 18, height 5' (1.524 m), weight 79.4 kg, SpO2 99 %. Body mass index is 34.18 kg/m.   Treatment Plan Summary: Daily contact with patient to assess and evaluate symptoms and progress in treatment, Medication management, and Plan continue current medications.  Start colchicine.  Parks Ranger, DO 07/29/2022, 12:56 PM

## 2022-07-29 NOTE — BHH Suicide Risk Assessment (Signed)
[facility] INPATIENT:  Family/Significant Other Suicide Prevention Education  Suicide Prevention Education:  Education Completed; Rhonda [name], [phone]  has been identified by the patient as the family member/significant other with whom the patient will be residing, and identified as the person(s) who will aid the patient in the event of a mental health crisis (suicidal ideations/suicide attempt).  With written consent from the patient, the family member/significant other has been provided the following suicide prevention education, prior to the and/or following the discharge of the patient.    The suicide prevention education provided includes the following: Suicide risk factors Suicide prevention and interventions National Suicide Hotline telephone number [facility] assessment telephone number [facility] Emergency Assistance [place] and/or Residential Mobile Crisis Unit telephone number  Request made of family/significant other to: Remove weapons (e.g., guns, rifles, knives), all items previously/currently identified as safety concern.   Remove drugs/medications (over-the-counter, prescriptions, illicit drugs), all items previously/currently identified as a safety concern.  The family member/significant other verbalizes understanding of the suicide prevention education information provided.  The family member/significant other agrees to remove the items of safety concern listed above.  He states that she had been getting "rather aggressive" in behavior. He stated that her short term memory "was shot" and facilitated frustration and anger and that she was shouting and "carrying on." He felt that she is not typically a danger to herself or others but it had "gotten out of hand" last week. He stated that pt could return home if pt's behavior improves. He states that she had erratic behavior and accuses husband of not going to work and not taking her places. He said she  hides items like her purse and can't remember where they are located.   He stated that [facility] provides assistance with nursing and social services aid that comes twice a week and that the insurance company states that she is getting all the services insurance can cover and that any additional services would be an out of pocket expense. He stated that there are not weapons or firearms in the home. He said pt did have a psychiatrist, Dr. Doran Clay in the past, but that pt wanted a referral for a new psychiatrist.    Ottavio Norem A [place] 07/29/2022, 2:43 PM

## 2022-07-29 NOTE — Group Note (Signed)
[facility] LCSW Group Therapy Note   Group Date: 07/29/2022 Start Time: 1300 End Time: 1345   Type of Therapy/Topic:  Group Therapy:  Emotion Regulation  Participation Level:  Active   Mood:  Description of Group:    The purpose of this group is to assist patients in learning to regulate negative emotions and experience positive emotions. Patients will be guided to discuss ways in which they have been vulnerable to their negative emotions. These vulnerabilities will be juxtaposed with experiences of positive emotions or situations, and patients challenged to use positive emotions to combat negative ones. Special emphasis will be placed on coping with negative emotions in conflict situations, and patients will process healthy conflict resolution skills.  Therapeutic Goals: Patient will identify two positive emotions or experiences to reflect on in order to balance out negative emotions:  Patient will label two or more emotions that they find the most difficult to experience:  Patient will be able to demonstrate positive conflict resolution skills through discussion or role plays:   Summary of Patient Progress:   Patient was present for the entirety of the group session. Patient was an active listener and participated in the topic of discussion, provided helpful advice to others, and added nuance to topic of conversation. CSW led participants in mindfulness activity. She stated that she was hoping it would make her foot feel better because she is in a significant amount of pain. She said that "she didn't have to think about anything" and that it was very nice to experience that. She said it is something she would like to practice at home.     Therapeutic Modalities:   Cognitive Behavioral Therapy Feelings Identification Dialectical Behavioral Therapy   Shamonique Battiste A [place], LCSWA

## 2022-07-29 NOTE — BH Assessment (Signed)
1900 Received patient sitting in the day room visiting with daughter. Her affect is flat but she is engage in the conversation. Will continue to monitor patient for safety.  2000 patient at the nurses station requesting Colchicine for her gout flare up. Patient stated she had requested the medication and  has bee waiting for it. Patient reminded that she received her first dose for this flare up at 3 pm. Patient received patient education on the medication and dose times. Patient became agitated that she was not able to get the medication now and had forgotten that she had it a 3 pm. It was explained to the patient that the next does time was scheduled for 2200 and that the medication must not be given to close together.   Patient replied " I didn't need you to say all that you just could have said that I couldn't have yet.  It was explained to the patient that this Probation officer educated her on medication times because she indicated that she did not remember that she had already taken her first dose. Will continue to monitor patient for safety.  2345 Patient continues  to exhibited high anxiety  moments about her medications, gout, being cold, and losing her teeth. Patient had wrapped her teeth in a pull up on her night stand. She frequently yells out. Patient is not sleeping despite having Trazodone 100 mg for sleep and Clozapine 0.5 mg for anxiety. Will continue to monitor and support patient with her care.  [id number] Call J. Grandville Silos, RN to informed of patient's status. Patient is severely agitated  and anxious and is unable to sleep. Patient has an order for Haldol 5 mg to be given with Ativan 1 mg and Benadryl dosage not specified nor is the order for Benadryl on MAR.. Order obtained for Benadryl 25 mg as a onetime order.  0145 Patient noted to be asleep on the 0045 safety round awaited to see if patient would awaken but she has not.  0400 Patient has remained asleep since initial sleep time. Will continue to  monitor patient for safety.  0600 Patient is resting in bed with eyes closed, however she is having conversation with unseen entity. Will continue to monitor patient for safety.  [id number] Patient's weight is 184 lbs.

## 2022-07-29 NOTE — Plan of Care (Signed)
  Problem: Safety: Goal: Ability to remain free from injury will improve Outcome: Progressing   Problem: Activity: Goal: Risk for activity intolerance will decrease Outcome: Not Progressing   Problem: Pain Managment: Goal: General experience of comfort will improve Outcome: Not Progressing   

## 2022-07-30 DIAGNOSIS — F332 Major depressive disorder, recurrent severe without psychotic features: Secondary | ICD-10-CM | POA: Diagnosis not present

## 2022-07-30 MED ORDER — MEMANTINE HCL 10 MG PO TABS
5.0000 mg | ORAL_TABLET | Freq: Every day | ORAL | Status: DC
  Administered 2022-07-30 – 2022-08-06 (×8): 5 mg via ORAL
  Filled 2022-07-30 (×8): qty 1

## 2022-07-30 MED ORDER — DIPHENHYDRAMINE HCL 25 MG PO CAPS: 25.0000 mg | ORAL_CAPSULE | Freq: Once | ORAL | Status: DC

## 2022-07-30 MED ORDER — RISPERIDONE 0.25 MG PO TABS
0.2500 mg | ORAL_TABLET | ORAL | Status: DC
  Administered 2022-07-30 – 2022-07-31 (×3): 0.25 mg via ORAL
  Filled 2022-07-30 (×3): qty 1

## 2022-07-30 NOTE — Group Note (Signed)
LCSW Group Therapy Note  Group Date: 07/30/2022 Start Time: [id number] End Time: 1325   Type of Therapy and Topic:  Group Therapy: Positive Affirmations  Participation Level:  Did Not Attend   Description of Group:   This group addressed positive affirmation towards self and others.  Patients went around the room and identified two positive things about themselves and two positive things about a peer in the room.  Patients reflected on how it felt to share something positive with others, to identify positive things about themselves, and to hear positive things from others/ Patients were encouraged to have a daily reflection of positive characteristics or circumstances.   Therapeutic Goals: Patients will verbalize two of their positive qualities Patients will demonstrate empathy for others by stating two positive qualities about a peer in the group Patients will verbalize their feelings when voicing positive self affirmations and when voicing positive affirmations of others Patients will discuss the potential positive impact on their wellness/recovery of focusing on positive traits of self and others.  Summary of Patient Progress:   X   Therapeutic Modalities:   Cognitive Behavioral Therapy Motivational Interviewing    Rhonda [name], [place] 07/30/2022  1:27 PM

## 2022-07-30 NOTE — BH Assessment (Addendum)
Recreation Therapy Notes  INPATIENT RECREATION THERAPY ASSESSMENT  Patient Details Name: Rhonda [name] MRN: [id number] DOB: [birth year] Today's Date: 07/30/2022       Information Obtained From: Patient (In addition to chart review)  Able to Participate in Assessment/Interview: Yes  Patient Presentation: Responsive, Alert  Reason for Admission (Per Patient): Other (Comments) ("I got into an arguement with my husband. I wanted to paint the cabinets and he did not want to.")  Patient Stressors: Relationship, Other (Comment) ("My husband and I argue sometimes. I also never leave the house because he does not want to. so I feel stuck. I can drive but he does not like for me to becasue he thinks I will get confused even though I have never been in a car acciddent and he has.")  Coping Skills:   Isolation, Arguments, Prayer, Other (Comment), Journal ("Ice Tea while I read the Bible")  Leisure Interests (2+):  Social - Friends, Medical laboratory scientific officer - Travel (Comment), Community - Movies ("I like to go out to dinner with friends".)  Frequency of Recreation/Participation: Weekly  Awareness of Community Resources:  Yes  Community Resources:  Engineer, drilling, [place], Patent examiner  Current Use: Yes  If no, Barriers?:    Expressed Interest in [place]: No  County of Residence[place]  Patient Main Form of Transportation: Car  Patient Strengths:  "I am good at painting when I am in the mood to. I like to paint abstract things. I am also a good friend and a good listener to others".  Patient Identified Areas of Improvement:  "I want to walk more...and just do more. I seem to do everything for my daughters and husband and not myself."  Patient Goal for Hospitalization:  "To go home"  Current SI (including self-harm):  No  Current HI:  No  Current AVH: No  Staff Intervention Plan: Group Attendance, Collaborate with Interdisciplinary Treatment  Team  Consent to Intern Participation: N/A   Juline Patch LRT. CTRS Jamice Carreno E Ivory Maduro 07/30/2022, 11:57 AM

## 2022-07-30 NOTE — Progress Notes (Signed)
Patient is A+O x 3. She denies SI/HI/AVH. She denies depression and anxiety. Appetite fair. Medications adm whole without any issues.  No Tylenol requested for R foot that has the gout flare up. Risperidone 0.25 mg and Namenda 5 mg added to scheduled medications. Lipid panel scheduled for tomorrow morning 03.08.24.    Q15 minute unit checks in place.

## 2022-07-30 NOTE — BH Assessment (Signed)
1910 Received patient sitting in the day room visiting with her daughter. She is alert and oriented x3. Will continue to monitor patient for safety.  2055 Patient is currently denying SI/HI, A/V hallucinations, depression and anxiety. Although patient is denying anxiety this Probation officer note patient to be anxious by her body language. She does not that she has pain in her right foot and is requesting Tylenol. She received Tylenol 650 mg for right foot pain of 5/10. Will continue to monitor patient for pain status and safety.  2115 Patient continue to be oriented x 3 with periods of forgetfulness. She is currently sitting in the day room watching a movie in which she seems to be following the story line.   2300 Patient need max assistance to use the bathroom and to get out of the [place]- chair to her bed. She can weight bare approx. 60% but get severely confused on her to place her feet to walk. She also had some difficulty understanding on how to use the walker. This is a great change form Monday when she was using the walker independent and attending to her bathroom needs.  She continues to have periods of confusion. Will continue to monitor patient for safety.  0300 Patient continue to rest quietly in bed. No talking in her sleep noted this shift.  0600 Patient  awakened for 6 AM medication.. Will continue to monitor patient for safety.

## 2022-07-30 NOTE — Progress Notes (Signed)
[facility] MD Progress Note  07/30/2022 11:03 AM NICKESHA KIEHN  MRN:  [id number] Subjective: Rhonda [name] is seen on rounds.  Social work has been in touch with the family and their biggest concern is that her cognitive function has been declining.  She has been compliant and pleasant.  She denies any side effects from her medication.  I will go ahead and start her on some Namenda in the meantime which might help with her irritability.  Since I am not seeing the behavior that they see at home I do believe that she probably is a little more aggressive and irritable at home so I think a low-dose of Risperdal will be worth a trial.  Principal Problem: MDD (major depressive disorder), recurrent severe, without psychosis ([place]) Diagnosis: Principal Problem:   MDD (major depressive disorder), recurrent severe, without psychosis ([place]) Active Problems:   MDD (major depressive disorder)  Total Time spent with patient: 15 minutes  Past Psychiatric History: Major depressive disorder secondary to general medical condition. 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    Complication of anesthesia    History of low blood pressure after surgery; attributed to lying flat   Diabetes mellitus    "diet controlled" (05/03/2013)   Exertional shortness of breath    Generalized anxiety disorder    GERD (gastroesophageal reflux disease)    Gout    Heart murmur    Hyperlipidemia    Hypertension    Hypokalemia 11/08/2018    Hypothyroidism    Left bundle branch block    s/p CRT-D (04/2013)   Major depressive disorder    Migraines    On home oxygen therapy    "2L suppose to be q night" (05/03/2013)   Orthostatic hypotension 07/28/2017   Peripheral neuropathy    Feet   SVD (spontaneous vaginal delivery)    x 2   Unspecified vitamin D deficiency 03/26/2011   Does not take meds    Past Surgical History:  Procedure Laterality Date   BI-VENTRICULAR IMPLANTABLE CARDIOVERTER DEFIBRILLATOR N/A 05/03/2013   Procedure: BI-VENTRICULAR IMPLANTABLE CARDIOVERTER DEFIBRILLATOR  (CRT-D);  Surgeon: Evans Lance, MD;  Location: [facility] CATH LAB;  Service: Cardiovascular;  Laterality: N/A;   BI-VENTRICULAR IMPLANTABLE CARDIOVERTER DEFIBRILLATOR  (CRT-D)  05/03/2013   MDT CRTD implanted by Dr Lovena Le for non ischemic cardiomyopathy   BIOPSY  05/31/2018   Procedure: BIOPSY;  Surgeon: Ronnette Juniper, MD;  Location: [facility];  Service: Gastroenterology;;   [place] N/A 04/11/2020   Procedure: [place];  Surgeon: Evans Lance, MD;  Location: [facility] CV LAB;  Service: Cardiovascular;  Laterality: N/A;   BREAST LUMPECTOMY Left 07/28/2010   CARDIAC CATHETERIZATION  2010   NORMAL CORONARY ARTERIES   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--11 SURGERIES     PT HAS REMOVABLE SPEECH BULB-TAKES IT OUT BEFORE HER SURGERY   COLONOSCOPY  COLONOSCOPY N/A 05/31/2018   Procedure: COLONOSCOPY;  Surgeon: Ronnette Juniper, MD;  Location: [facility] ENDOSCOPY;  Service: Gastroenterology;  Laterality: N/A;   HYSTEROSCOPY WITH D & C  01/20/2012   Procedure: DILATATION AND CURETTAGE /HYSTEROSCOPY;  Surgeon: Margarette Asal, MD;  Location: [facility];  Service: Gynecology;  Laterality: N/A;  with trueclear   PORT-A-CATH REMOVAL  09/23/2011   Procedure: REMOVAL PORT-A-CATH;  Surgeon: Rolm Bookbinder, MD;  Location: [facility] ORS;  Service: General;  Laterality: N/A;  Port Removal   PORTACATH PLACEMENT  2012   TONSILLECTOMY  1960's   TOTAL  KNEE ARTHROPLASTY  05/10/2012   Procedure: TOTAL KNEE ARTHROPLASTY;  Surgeon: Mauri Pole, MD;  Location: [facility]S;  Service: Orthopedics;  Laterality: Right;   WISDOM TOOTH EXTRACTION     Family History:  Family History  Problem Relation Age of Onset   Alzheimer's disease Mother    CVA Mother    Hypertension Mother    Cancer - Colon Father        late 41   Birth defects Paternal Uncle    Family Psychiatric  History: Unremarkable Social History:  Social History   Substance and Sexual Activity  Alcohol Use No     Social History   Substance and Sexual Activity  Drug Use No    Social History   Socioeconomic History   Marital status: Married    Spouse name: Not on file   Number of children: 2   Years of education: 18   Highest education level: Master's degree (e.g., MA, MS, MEng, MEd, MSW, MBA)  Occupational History   Occupation: retired  Tobacco Use   Smoking status: Some Days    Packs/day: 0.10    Years: 26.00    Total pack years: 2.60    Types: Cigarettes    Last attempt to quit: 03/23/2021    Years since quitting: 1.3   Smokeless tobacco: Never  Vaping Use   Vaping Use: Never used  Substance and Sexual Activity   Alcohol use: No   Drug use: No   Sexual activity: Yes  Other Topics Concern   Not on file  Social History Narrative   Tobacco Use Cigarettes: Former Smoker, Quit in 2008   No Alcohol   No recreational drug use   Diet: Regular/Low Carb   Exercise: None   Occupation: disabled   Education: Research officer, political party, masters   Children: 2   Firearms: No   Therapist, art Use: 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      Social Determinants of Health   Financial Resource Strain: Not on file  Food Insecurity: No Food Insecurity (07/27/2022)   Hunger Vital Sign    Worried About Running Out of Food in the Last Year: Never true    Ran Out of Food in the Last Year: Never true  Transportation Needs: No Transportation Needs (07/27/2022)    PRAPARE - Hydrologist (Medical): No    Lack of Transportation (Non-Medical): No  Physical Activity: Not on file  Stress: Not on file  Social Connections: Not on file   Additional Social History:                         Sleep: Good  Appetite:  Good  Current Medications: Current Facility-Administered Medications  Medication Dose Route Frequency Provider Last Rate Last Admin   acetaminophen (TYLENOL) tablet 650 mg  650 mg Oral Q6H PRN Parks Ranger, DO   650 mg at 07/29/22 1254   alum & mag hydroxide-simeth (MAALOX/MYLANTA) 200-200-20 MG/5ML suspension 30 mL  30 mL Oral Q4H PRN Parks Ranger, DO       ascorbic acid (VITAMIN C) tablet 1,000 mg  1,000 mg Oral Daily Parks Ranger, DO   1,000 mg at 07/30/22 0931   buPROPion (WELLBUTRIN XL) 24 hr tablet 150 mg  150 mg Oral Daily Parks Ranger, DO   150 mg at 07/30/22 0931   carvedilol (COREG) tablet 3.125 mg  3.125 mg Oral BID WC Parks Ranger, DO   3.125 mg at 07/30/22 0857   citalopram (CELEXA) tablet 20 mg  20 mg Oral Daily Parks Ranger, DO   20 mg at 07/30/22 [id number]   clonazePAM (KLONOPIN) tablet 0.5 mg  0.5 mg Oral BID PRN Parks Ranger, DO   0.5 mg at 07/29/22 2126   colchicine tablet 0.6 mg  0.6 mg Oral BID Parks Ranger, DO   0.6 mg at 07/30/22 0932   dicyclomine (BENTYL) tablet 20 mg  20 mg Oral TID AC Parks Ranger, DO   20 mg at 07/30/22 [id number]   diphenhydrAMINE (BENADRYL) capsule 25 mg  25 mg Oral Once Caroline Sauger, NP       fluticasone (FLONASE) 50 MCG/ACT nasal spray 1 spray  1 spray Each Nare Daily Parks Ranger, DO   1 spray at 07/30/22 0930   gabapentin (NEURONTIN) capsule 100 mg  100 mg Oral BID Parks Ranger, DO   100 mg at 07/30/22 [id number]   haloperidol (HALDOL) tablet 5 mg  5 mg Oral TID PRN Vesta Mixer, NP       Or   haloperidol lactate (HALDOL) injection 5 mg  5 mg  Intramuscular TID PRN Vesta Mixer, NP       levothyroxine (SYNTHROID) tablet 88 mcg  88 mcg Oral Q0600 Parks Ranger, DO   88 mcg at 07/30/22 0645   LORazepam (ATIVAN) tablet 1 mg  1 mg Oral Q4H PRN Parks Ranger, DO       magnesium hydroxide (MILK OF MAGNESIA) suspension 30 mL  30 mL Oral Daily PRN Parks Ranger, DO       multivitamin with minerals tablet 1 tablet  1 tablet Oral Daily Parks Ranger, DO   1 tablet at 07/30/22 0931   OLANZapine (ZYPREXA) tablet 5 mg  5 mg Oral Q6H PRN Parks Ranger, DO       pantoprazole (PROTONIX) EC tablet 40 mg  40 mg Oral Daily PRN Parks Ranger, DO       QUEtiapine (SEROQUEL) tablet 50 mg  50 mg Oral QHS Parks Ranger, DO   50 mg at 07/29/22 2126   simvastatin (ZOCOR) tablet 10 mg  10 mg Oral QPM Parks Ranger, DO   10 mg at 07/29/22 1655   spironolactone (ALDACTONE) tablet 25 mg  25 mg Oral Daily Parks Ranger, DO   25 mg at 07/30/22 0931   torsemide (DEMADEX) tablet 80 mg  80 mg Oral BID Parks Ranger, DO   80 mg at 07/30/22 0857   traZODone (DESYREL) tablet 100 mg  100 mg Oral QHS PRN Parks Ranger, DO   100 mg at 07/29/22 2126    Lab Results: No results found. However, due to the size of the patient record, not all encounters were searched. Please check Results  Review for a complete set of results.  Blood Alcohol level:  Lab Results  Component Value Date   ETH <10 07/26/2022   ETH <10 99991111    Metabolic Disorder Labs: Lab Results  Component Value Date   HGBA1C 7.0 (H) 10/09/2021   MPG 154.2 10/09/2021   MPG 151.33 10/08/2021   No results found for: "PROLACTIN" Lab Results  Component Value Date   CHOL  08/05/2009    163        ATP III CLASSIFICATION:  <200     mg/dL   Desirable  200-239  mg/dL   Borderline High  >=240    mg/dL   High          TRIG 149 08/05/2009   HDL 41 08/05/2009   CHOLHDL 4.0 08/05/2009   VLDL 30  08/05/2009   LDLCALC  08/05/2009    92        Total Cholesterol/HDL:CHD Risk Coronary Heart Disease Risk Table                     Men   Women  1/2 Average Risk   3.4   3.3  Average Risk       5.0   4.4  2 X Average Risk   9.6   7.1  3 X Average Risk  23.4   11.0        Use the calculated Patient Ratio above and the CHD Risk Table to determine the patient's CHD Risk.        ATP III CLASSIFICATION (LDL):  <100     mg/dL   Optimal  100-129  mg/dL   Near or Above                    Optimal  130-159  mg/dL   Borderline  160-189  mg/dL   High  >190     mg/dL   Very High   LDLCALC (H) 11/02/2008    161        Total Cholesterol/HDL:CHD Risk Coronary Heart Disease Risk Table                     Men   Women  1/2 Average Risk   3.4   3.3  Average Risk       5.0   4.4  2 X Average Risk   9.6   7.1  3 X Average Risk  23.4   11.0        Use the calculated Patient Ratio above and the CHD Risk Table to determine the patient's CHD Risk.        ATP III CLASSIFICATION (LDL):  <100     mg/dL   Optimal  100-129  mg/dL   Near or Above                    Optimal  130-159  mg/dL   Borderline  160-189  mg/dL   High  >190     mg/dL   Very High    Physical Findings: AIMS:  , ,  ,  ,    CIWA:    COWS:     Musculoskeletal: Strength & Muscle Tone: within normal limits Gait & Station: normal Patient leans: N/A  Psychiatric Specialty Exam:  Presentation  General Appearance:  Appropriate for Environment  Eye Contact: Good  Speech: Clear and Coherent  Speech Volume: Normal  Handedness:No data recorded  Mood and Affect  Mood: Anxious  Affect: Congruent   Thought Process  Thought Processes: Coherent; Goal Directed  Descriptions of Associations:Intact  Orientation:Partial  Thought Content:WDL  History of Schizophrenia/Schizoaffective disorder:No  Duration of Psychotic Symptoms:No data recorded Hallucinations:No data recorded Ideas of  Reference:None  Suicidal Thoughts:No data recorded Homicidal Thoughts:No data recorded  Sensorium  Memory: Immediate Fair; Recent Fair  Judgment: Fair  Insight: Poor   Executive Functions  Concentration: Fair  Attention Span: Fair  Recall: [place] of Knowledge: Good  Language: Good   Psychomotor Activity  Psychomotor Activity:No data recorded  Assets  Assets: Leisure Time; Physical Health; Resilience   Sleep  Sleep:No data recorded   Physical Exam: Physical Exam Vitals and nursing note reviewed.  Constitutional:      Appearance: Normal appearance. She is normal weight.  Neurological:     General: No focal deficit present.     Mental Status: She is alert and oriented to person, place, and time.  Psychiatric:        Attention and Perception: Attention and perception normal.        Mood and Affect: Mood and affect normal.        Speech: Speech normal.        Behavior: Behavior normal. Behavior is cooperative.        Thought Content: Thought content normal.        Cognition and Memory: Memory normal. Cognition is impaired.        Judgment: Judgment is inappropriate.    Review of Systems  Constitutional: Negative.   HENT: Negative.    Eyes: Negative.   Respiratory: Negative.    Cardiovascular: Negative.   Gastrointestinal: Negative.   Genitourinary: Negative.   Musculoskeletal: Negative.   Skin: Negative.   Neurological: Negative.   Endo/Heme/Allergies: Negative.   Psychiatric/Behavioral:  Positive for memory loss.    Blood pressure 132/67, pulse 97, temperature 98.5 F (36.9 C), temperature source Oral, resp. rate 18, height 5' (1.524 m), weight 83.5 kg, SpO2 95 %. Body mass index is 35.94 kg/m.   Treatment Plan Summary: Daily contact with patient to assess and evaluate symptoms and progress in treatment, Medication management, and Plan start Risperdal 0.25 mg twice a day and Namenda 5 mg daily.  [place], DO 07/30/2022,  11:03 AM

## 2022-07-30 NOTE — Group Note (Signed)
Recreation Therapy Group Note   Group Topic:Leisure Education  Group Date: 07/30/2022 Start Time: 1400 End Time: 1435 Facilitators: Vilma Prader, LRT, CTRS Location: Courtyard  Group Description: Leisure. Patients were given the opportunity to journal, walk, or listen to music while sitting in the courtyard getting fresh air and sunlight. Pt identified and conversated about things they enjoy doing in their free time and how they can continue to do that outside of the hospital.  Affect/Mood: N/A   Participation Level: Did not attend    Clinical Observations/Individualized Feedback: Rhonda [name] did not attend group due to resting in her room.   Plan: Continue to engage patient in RT group sessions 2-3x/week.   Vilma Prader, LRT, CTRS 07/30/2022 2:50 PM

## 2022-07-30 NOTE — Plan of Care (Signed)
  Problem: Coping Skills Goal: STG - Patient will identify 3 positive coping skills strategies to use post d/c within 5 recreation therapy group sessions Description: STG - Patient will identify 3 positive coping skills strategies to use post d/c within 5 recreation therapy group sessions 07/30/2022 1200 by Juline Patch E, LRT Outcome: Progressing

## 2022-07-31 ENCOUNTER — Other Ambulatory Visit: Payer: Medicare HMO

## 2022-07-31 DIAGNOSIS — Z515 Encounter for palliative care: Secondary | ICD-10-CM

## 2022-07-31 DIAGNOSIS — F332 Major depressive disorder, recurrent severe without psychotic features: Secondary | ICD-10-CM | POA: Diagnosis not present

## 2022-07-31 LAB — LIPID PANEL
Cholesterol: 133 mg/dL (ref 0–200)
HDL: 51 mg/dL (ref >40)
LDL Cholesterol: 69 mg/dL (ref 0–99)
Total CHOL/HDL Ratio: 2.6 RATIO
Triglycerides: 67 mg/dL (ref <150)
VLDL: 13 mg/dL (ref 0–40)

## 2022-07-31 MED ORDER — RISPERIDONE 1 MG PO TABS
0.5000 mg | ORAL_TABLET | ORAL | Status: DC
  Administered 2022-07-31 – 2022-08-06 (×12): 0.5 mg via ORAL
  Filled 2022-07-31 (×12): qty 1

## 2022-07-31 MED ORDER — POTASSIUM CHLORIDE CRYS ER 20 MEQ PO TBCR: 60.0000 meq | EXTENDED_RELEASE_TABLET | Freq: Two times a day (BID) | ORAL | Status: DC

## 2022-07-31 MED ORDER — PANTOPRAZOLE SODIUM 40 MG PO TBEC
40.0000 mg | DELAYED_RELEASE_TABLET | Freq: Every day | ORAL | Status: DC
  Administered 2022-07-31 – 2022-08-06 (×7): 40 mg via ORAL
  Filled 2022-07-31 (×7): qty 1

## 2022-07-31 MED ORDER — MIDODRINE HCL 5 MG PO TABS
10.0000 mg | ORAL_TABLET | Freq: Three times a day (TID) | ORAL | Status: DC
  Administered 2022-07-31 – 2022-08-06 (×17): 10 mg via ORAL
  Filled 2022-07-31 (×19): qty 2

## 2022-07-31 NOTE — Group Note (Signed)
LCSW Group Therapy Note  Group Date: 07/31/2022 Start Time: 1315 End Time: 1330   Type of Therapy and Topic:  Group Therapy - Healthy vs Unhealthy Coping Skills  Participation Level:  Did Not Attend   Description of Group The focus of this group was to determine what unhealthy coping techniques typically are used by group members and what healthy coping techniques would be helpful in coping with various problems. Patients were guided in becoming aware of the differences between healthy and unhealthy coping techniques. Patients were asked to identify 2-3 healthy coping skills they would like to learn to use more effectively.  Therapeutic Goals Patients learned that coping is what human beings do all day long to deal with various situations in their lives Patients defined and discussed healthy vs unhealthy coping techniques Patients identified their preferred coping techniques and identified whether these were healthy or unhealthy Patients determined 2-3 healthy coping skills they would like to become more familiar with and use more often. Patients provided support and ideas to each other   Summary of Patient Progress:  X  Therapeutic Modalities Cognitive Behavioral Therapy Motivational Interviewing  Glendell Fouse A [place], LCSWA 07/31/2022  1:44 PM

## 2022-07-31 NOTE — Plan of Care (Signed)
  Problem: Nutrition: Goal: Adequate nutrition will be maintained Outcome: Progressing   Problem: Coping: Goal: Level of anxiety will decrease 07/31/2022 1059 by Ileene Musa, RN Outcome: Progressing 07/31/2022 0817 by Ileene Musa, RN Outcome: Progressing   Problem: Pain Managment: Goal: General experience of comfort will improve Outcome: Progressing   Problem: Safety: Goal: Ability to remain free from injury will improve Outcome: Progressing   Problem: Activity: Goal: Risk for activity intolerance will decrease Outcome: Not Progressing

## 2022-07-31 NOTE — Progress Notes (Signed)
PATIENT NAME: ALAJHA SAMPAT DOB: [birth year] MRN: [id number]  PRIMARY CARE PROVIDER: [name], MD  RESPONSIBLE PARTY:  Acct ID - Guarantor Home Phone Work Phone Relationship Acct Type  [id number] KADIATU, [phone]  Self P/F     [address], [place], [place] [address]   Palliative Care Telephone Follow Up Encounter Note   I connected with Mr. Tewana Zipper regarding YULIET POIROT on 07/31/22 by telephone and verified that I am speaking with the correct person using two identifiers.   Spoke to Mr. Finlinson who report Mrs. Shenee Ganesan has been involuntarily committed to the [facility] unit. Mr. Gloeckner reports the pt was exhibiting out-of-control behavior (cutting up clothes, initiating arguments and escalating them to violence) and he could not handle it alone. He also reports he went to the magistrate's office to get an IVC order. Mr. Waskey reports being unsure if patient was taking her meds as he instructed when he went to work even though he would leave them out for her and put them in labeled medicine cups.    LPN has an appointment scheduled for August 20, 2022 at 10am to see the patient in her home.   CODE STATUS: Full Code  ADVANCED DIRECTIVES: N MOST FORM: N    PHYSICAL EXAM:   VITALS:There were no vitals filed for this visit.        Raseel Jans Georgann Housekeeper, LPN

## 2022-07-31 NOTE — Plan of Care (Signed)
  Problem: Coping: Goal: Level of anxiety will decrease Outcome: Progressing   Problem: Pain Managment: Goal: General experience of comfort will improve Outcome: Progressing   Problem: Safety: Goal: Ability to remain free from injury will improve Outcome: Progressing   

## 2022-07-31 NOTE — Progress Notes (Signed)
Patient is A+O x 3, not to situation. She denies depression and anxiety. She denies SI/HI/AVH. Appetite good. Medication compliant.   Tylenol requested 2 times for complaints of R foot pain from a gout flare up. Upon follow up both times, pain had decreased. Patient states that she cannot walk and can only transfer using her left leg.   CMP and CBC scheduled for tomorrow morning 03.09.24.  Q15 minute unit checks in place.

## 2022-07-31 NOTE — BHH Counselor (Signed)
CSW received contact from pt's husband, Breana Harville. He stated he wanted more options for pt's long term care as being at home for the foreseeable future is "disconcerting" due to pt's recent behavior.   CSW provided him with contact information for "[facility]" representative, Artis Flock. He stated he would like his information shared as well so she could reach out and assist with information regarding placement options for the future.   Discharge plan is for pt to return home and resources for long term assisted living options to be explored from there.   No other requests made. Conversation ended without incident.   Demetria Lightsey [place], MSW, LCSW-A 3/8/202410:42 AM

## 2022-07-31 NOTE — Progress Notes (Signed)
Remote ICD transmission.   

## 2022-07-31 NOTE — Progress Notes (Signed)
[facility] MD Progress Note  07/31/2022 11:45 AM BRADIE CANDIDO  MRN:  [id number] Subjective: Rhonda [name] is seen on rounds.  Yesterday I started her on Namenda and Risperdal.  She is tolerating it denies any side effects.  She has been in good controls and nurses report no issues. Principal Problem: MDD (major depressive disorder), recurrent severe, without psychosis ([place]) Diagnosis: Principal Problem:   MDD (major depressive disorder), recurrent severe, without psychosis ([place]) Active Problems:   MDD (major depressive disorder)  Total Time spent with patient: 15 minutes  Past Psychiatric History: Depression secondary to chronic medical condition.  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    Complication of anesthesia    History of [name] blood pressure after surgery; attributed to lying flat   Diabetes mellitus    "diet controlled" (05/03/2013)   Exertional shortness of breath    Generalized anxiety disorder    GERD (gastroesophageal reflux disease)    Gout    Heart murmur    Hyperlipidemia    Hypertension    Hypokalemia 11/08/2018   Hypothyroidism    Left bundle branch block    s/p CRT-D (04/2013)   Major depressive disorder    Migraines    On home oxygen therapy    "2L suppose to be q night" (05/03/2013)   Orthostatic hypotension 07/28/2017   Peripheral neuropathy    Feet   SVD (spontaneous vaginal delivery)    x 2   Unspecified vitamin D deficiency 03/26/2011   Does not take meds    Past  Surgical History:  Procedure Laterality Date   BI-VENTRICULAR IMPLANTABLE CARDIOVERTER DEFIBRILLATOR N/A 05/03/2013   Procedure: BI-VENTRICULAR IMPLANTABLE CARDIOVERTER DEFIBRILLATOR  (CRT-D);  Surgeon: Evans Lance, MD;  Location: [facility] CATH LAB;  Service: Cardiovascular;  Laterality: N/A;   BI-VENTRICULAR IMPLANTABLE CARDIOVERTER DEFIBRILLATOR  (CRT-D)  05/03/2013   MDT CRTD implanted by Dr Lovena Le for non ischemic cardiomyopathy   BIOPSY  05/31/2018   Procedure: BIOPSY;  Surgeon: Ronnette Juniper, MD;  Location: [facility] END[facility];  Service: Gastroenterology;;   [place] N/A 04/11/2020   Procedure: [place];  Surgeon: Evans Lance, MD;  Location: [place] CV LAB;  Service: Cardiovascular;  Laterality: N/A;   BREAST LUMPECTOMY Left 07/28/2010   CARDIAC CATHETERIZATION  2010   NORMAL CORONARY ARTERIES   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--11 SURGERIES     PT HAS REMOVABLE SPEECH BULB-TAKES IT OUT BEFORE HER SURGERY   COLONOSCOPY     COLONOSCOPY N/A 05/31/2018   Procedure: COLONOSCOPY;  Surgeon: Ronnette Juniper, MD;  Location: [facility] ENDOSCOPY;  Service: Gastroenterology;  Laterality: N/A;   HYSTEROSCOPY WITH D & C  01/20/2012   Procedure: DILATATION AND CURETTAGE /HYSTEROSCOPY;  Surgeon: Margarette Asal, MD;  Location: [place] [facility];  Service: Gynecology;  Laterality: N/A;  with trueclear   PORT-A-CATH REMOVAL  09/23/2011   Procedure: REMOVAL PORT-A-CATH;  Surgeon:  Rolm Bookbinder, MD;  Location: [facility];  Service: General;  Laterality: N/A;  Port Removal   PORTACATH PLACEMENT  2012   TONSILLECTOMY  1960's   TOTAL KNEE ARTHROPLASTY  05/10/2012   Procedure: TOTAL KNEE ARTHROPLASTY;  Surgeon: Mauri Pole, MD;  Location: [facility] ORS;  Service: Orthopedics;  Laterality: Right;   WISDOM TOOTH EXTRACTION     Family History:  Family History  Problem Relation Age of Onset   Alzheimer's disease Mother    CVA Mother    Hypertension Mother    Cancer - Colon Father        late 1   Birth  defects Paternal Uncle    Family Psychiatric  History: Unremarkable Social History:  Social History   Substance and Sexual Activity  Alcohol Use No     Social History   Substance and Sexual Activity  Drug Use No    Social History   Socioeconomic History   Marital status: Married    Spouse name: Not on file   Number of children: 2   Years of education: 18   Highest education level: Master's degree (e.g., MA, MS, MEng, MEd, MSW, MBA)  Occupational History   Occupation: retired  Tobacco Use   Smoking status: Some Days    Packs/day: 0.10    Years: 26.00    Total pack years: 2.60    Types: Cigarettes    Last attempt to quit: 03/23/2021    Years since quitting: 1.3   Smokeless tobacco: Never  Vaping Use   Vaping Use: Never used  Substance and Sexual Activity   Alcohol use: No   Drug use: No   Sexual activity: Yes  Other Topics Concern   Not on file  Social History Narrative   Tobacco Use Cigarettes: Former Smoker, Quit in 2008   No Alcohol   No recreational drug use   Diet: Regular/[name] Carb   Exercise: None   Occupation: disabled   Education: Research officer, political party, masters   Children: 2   Firearms: No   Therapist, art Use: 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      Social Determinants of Health   Financial Resource Strain: Not on file  Food Insecurity: No Food Insecurity (07/27/2022)   Hunger Vital Sign    Worried About Running Out of Food in the Last Year: Never true    Ran Out of Food in the Last Year: Never true  Transportation Needs: No Transportation Needs (07/27/2022)   PRAPARE - Hydrologist (Medical): No    Lack of Transportation (Non-Medical): No  Physical Activity: Not on file  Stress: Not on file  Social Connections: Not on file   Additional Social History:                         Sleep: Good  Appetite:  Good  Current Medications: Current Facility-Administered Medications  Medication Dose  Route Frequency Provider Last Rate Last Admin   acetaminophen (TYLENOL) tablet 650 mg  650 mg Oral Q6H PRN Parks Ranger, DO   650 mg at 07/31/22 0908   alum & mag hydroxide-simeth (MAALOX/MYLANTA) 200-200-20 MG/5ML suspension 30 mL  30 mL Oral Q4H PRN Parks Ranger, DO       ascorbic acid (VITAMIN C) tablet 1,000 mg  1,000 mg Oral Daily Parks Ranger, DO   1,000 mg at 07/31/22 [phone]  buPROPion (WELLBUTRIN XL) 24 hr tablet 150 mg  150 mg Oral Daily Parks Ranger, DO   150 mg at 07/31/22 0911   carvedilol (COREG) tablet 3.125 mg  3.125 mg Oral BID WC Parks Ranger, DO   3.125 mg at 07/31/22 [id number]   citalopram (CELEXA) tablet 20 mg  20 mg Oral Daily Parks Ranger, DO   20 mg at 07/31/22 [id number]   clonazePAM (KLONOPIN) tablet 0.5 mg  0.5 mg Oral BID PRN Parks Ranger, DO   0.5 mg at 07/29/22 2126   colchicine tablet 0.6 mg  0.6 mg Oral BID Parks Ranger, DO   0.6 mg at 07/31/22 [id number]   dicyclomine (BENTYL) tablet 20 mg  20 mg Oral TID AC Parks Ranger, DO   20 mg at 07/31/22 1138   diphenhydrAMINE (BENADRYL) capsule 25 mg  25 mg Oral Once Caroline Sauger, NP       fluticasone (FLONASE) 50 MCG/ACT nasal spray 1 spray  1 spray Each Nare Daily Parks Ranger, DO   1 spray at 07/31/22 1140   gabapentin (NEURONTIN) capsule 100 mg  100 mg Oral BID Parks Ranger, DO   100 mg at 07/31/22 [id number]   haloperidol (HALDOL) tablet 5 mg  5 mg Oral TID PRN Vesta Mixer, NP       Or   haloperidol lactate (HALDOL) injection 5 mg  5 mg Intramuscular TID PRN Vesta Mixer, NP       levothyroxine (SYNTHROID) tablet 88 mcg  88 mcg Oral Q0600 Parks Ranger, DO   88 mcg at 07/31/22 0541   LORazepam (ATIVAN) tablet 1 mg  1 mg Oral Q4H PRN Parks Ranger, DO       magnesium hydroxide (MILK OF MAGNESIA) suspension 30 mL  30 mL Oral Daily PRN Parks Ranger, DO       memantine [facility])  tablet 5 mg  5 mg Oral Daily Parks Ranger, DO   5 mg at 07/31/22 [id number]   multivitamin with minerals tablet 1 tablet  1 tablet Oral Daily Parks Ranger, DO   1 tablet at 07/31/22 0909   OLANZapine (ZYPREXA) tablet 5 mg  5 mg Oral Q6H PRN Parks Ranger, DO   5 mg at 07/30/22 2111   pantoprazole (PROTONIX) EC tablet 40 mg  40 mg Oral Daily PRN Parks Ranger, DO       QUEtiapine (SEROQUEL) tablet 50 mg  50 mg Oral QHS Parks Ranger, DO   50 mg at 07/30/22 2111   risperiDONE (RISPERDAL) tablet 0.5 mg  0.5 mg Oral [id number] Parks Ranger, DO       simvastatin (ZOCOR) tablet 10 mg  10 mg Oral QPM Parks Ranger, DO   10 mg at 07/30/22 1751   spironolactone (ALDACTONE) tablet 25 mg  25 mg Oral Daily Parks Ranger, DO   25 mg at 07/31/22 0911   torsemide (DEMADEX) tablet 80 mg  80 mg Oral BID Parks Ranger, DO   80 mg at 07/31/22 [id number]   traZODone (DESYREL) tablet 100 mg  100 mg Oral QHS PRN Parks Ranger, DO   100 mg at 07/30/22 2111    Lab Results: No results found. However, due to the size of the patient record, not all encounters were searched. Please check Results Review for a complete set of results.  Blood Alcohol level:  Lab Results  Component Value Date   ETH <10 07/26/2022  ETH <10 [id number]    Metabolic Disorder Labs: Lab Results  Component Value Date   HGBA1C 7.0 (H) 10/09/2021   MPG 154.2 10/09/2021   MPG 151.33 10/08/2021   No results found for: "PROLACTIN" Lab Results  Component Value Date   CHOL  08/05/2009    163        ATP III CLASSIFICATION:  <200     mg/dL   Desirable  200-239  mg/dL   Borderline High  >=240    mg/dL   High          TRIG 149 08/05/2009   HDL 41 08/05/2009   CHOLHDL 4.0 08/05/2009   VLDL 30 08/05/2009   LDLCALC  08/05/2009    92        Total Cholesterol/HDL:CHD Risk Coronary Heart Disease Risk Table                     Men   Women  1/2 Average  Risk   3.4   3.3  Average Risk       5.0   4.4  2 X Average Risk   9.6   7.1  3 X Average Risk  23.4   11.0        Use the calculated Patient Ratio above and the CHD Risk Table to determine the patient's CHD Risk.        ATP III CLASSIFICATION (LDL):  <100     mg/dL   Optimal  100-129  mg/dL   Near or Above                    Optimal  130-159  mg/dL   Borderline  160-189  mg/dL   High  >190     mg/dL   Very High   LDLCALC (H) 11/02/2008    161        Total Cholesterol/HDL:CHD Risk Coronary Heart Disease Risk Table                     Men   Women  1/2 Average Risk   3.4   3.3  Average Risk       5.0   4.4  2 X Average Risk   9.6   7.1  3 X Average Risk  23.4   11.0        Use the calculated Patient Ratio above and the CHD Risk Table to determine the patient's CHD Risk.        ATP III CLASSIFICATION (LDL):  <100     mg/dL   Optimal  100-129  mg/dL   Near or Above                    Optimal  130-159  mg/dL   Borderline  160-189  mg/dL   High  >190     mg/dL   Very High    Physical Findings: AIMS:  , ,  ,  ,    CIWA:    COWS:     Musculoskeletal: Strength & Muscle Tone: within normal limits Gait & Station: normal Patient leans: N/A  Psychiatric Specialty Exam:  Presentation  General Appearance:  Appropriate for Environment  Eye Contact: Good  Speech: Clear and Coherent  Speech Volume: Normal  Handedness:No data recorded  Mood and Affect  Mood: Anxious  Affect: Congruent   Thought Process  Thought Processes: Coherent; Goal Directed  Descriptions of Associations:Intact  Orientation:Partial  Thought Content:WDL  History of Schizophrenia/Schizoaffective disorder:No  Duration of Psychotic Symptoms:No data recorded Hallucinations:No data recorded Ideas of Reference:None  Suicidal Thoughts:No data recorded Homicidal Thoughts:No data recorded  Sensorium  Memory: Immediate Fair; Recent  Fair  Judgment: Fair  Insight: Poor   Executive Functions  Concentration: Fair  Attention Span: Fair  Recall: [place] of Knowledge: Good  Language: Good   Psychomotor Activity  Psychomotor Activity:No data recorded  Assets  Assets: Leisure Time; Physical Health; Resilience   Sleep  Sleep:No data recorded    Blood pressure 105/64, pulse 100, temperature 98.9 F (37.2 C), temperature source Oral, resp. rate 15, height 5' (1.524 m), weight 83.5 kg, SpO2 99 %. Body mass index is 35.94 kg/m.   Treatment Plan Summary: Daily contact with patient to assess and evaluate symptoms and progress in treatment, Medication management, and Plan increase Risperdal to half milligram twice a day.  [place], DO 07/31/2022, 11:45 AM

## 2022-07-31 NOTE — Group Note (Signed)
Recreation Therapy Group Note   Group Topic:Self-Esteem  Group Date: 07/31/2022 Start Time: 1400 End Time: 1415 Facilitators: Vilma Prader, LRT, CTRS Location:  Dayroom  Group Description: Patients and LRT discussed the importance of self-love and self-esteem. Pt completed a worksheet that helps them identify 24 different strengths and qualities about themselves. Pt encouraged to read aloud at least 3 off their sheet to the group. Pt's then had the choice to play "Positive Affirmation Bingo" afterwards, with journals or stress balls as bingo prizes.   Affect/Mood: N/A   Participation Level: Did not attend    Clinical Observations/Individualized Feedback: Maryiah did not attend group due to eating lunch because she was just getting out of bed.   Plan: Continue to engage patient in RT group sessions 2-3x/week.   Vilma Prader, LRT, CTRS 07/31/2022 2:32 PM

## 2022-08-01 DIAGNOSIS — F332 Major depressive disorder, recurrent severe without psychotic features: Secondary | ICD-10-CM | POA: Diagnosis not present

## 2022-08-01 LAB — CBC WITH DIFFERENTIAL/PLATELET
Abs Immature Granulocytes: 0.07 10*3/uL (ref 0.00–0.07)
Basophils Absolute: 0 10*3/uL (ref 0.0–0.1)
Basophils Relative: 1 %
Eosinophils Absolute: 0.2 10*3/uL (ref 0.0–0.5)
Eosinophils Relative: 3 %
HCT: 36.3 % (ref 36.0–46.0)
Hemoglobin: 11.9 g/dL — ABNORMAL LOW (ref 12.0–15.0)
Immature Granulocytes: 1 %
Lymphocytes Relative: 16 %
Lymphs Abs: 1.3 10*3/uL (ref 0.7–4.0)
MCH: 34.6 pg — ABNORMAL HIGH (ref 26.0–34.0)
MCHC: 32.8 g/dL (ref 30.0–36.0)
MCV: 105.5 fL — ABNORMAL HIGH (ref 80.0–100.0)
Monocytes Absolute: 0.6 10*3/uL (ref 0.1–1.0)
Monocytes Relative: 7 %
Neutro Abs: 5.8 10*3/uL (ref 1.7–7.7)
Neutrophils Relative %: 72 %
Platelets: 330 10*3/uL (ref 150–400)
RBC: 3.44 MIL/uL — ABNORMAL LOW (ref 3.87–5.11)
RDW: 13 % (ref 11.5–15.5)
WBC: 8 10*3/uL (ref 4.0–10.5)
nRBC: 0 % (ref 0.0–0.2)

## 2022-08-01 LAB — COMPREHENSIVE METABOLIC PANEL
ALT: 22 U/L (ref 0–44)
AST: 34 U/L (ref 15–41)
Albumin: 3.4 g/dL — ABNORMAL LOW (ref 3.5–5.0)
Alkaline Phosphatase: 124 U/L (ref 38–126)
Anion gap: 12 (ref 5–15)
BUN: 42 mg/dL — ABNORMAL HIGH (ref 8–23)
CO2: 26 mmol/L (ref 22–32)
Calcium: 8.9 mg/dL (ref 8.9–10.3)
Chloride: 98 mmol/L (ref 98–111)
Creatinine, Ser: 1.47 mg/dL — ABNORMAL HIGH (ref 0.44–1.00)
GFR, Estimated: 38 mL/min — ABNORMAL LOW (ref >60)
Glucose, Bld: 187 mg/dL — ABNORMAL HIGH (ref 70–99)
Potassium: 3.1 mmol/L — ABNORMAL LOW (ref 3.5–5.1)
Sodium: 136 mmol/L (ref 135–145)
Total Bilirubin: 0.4 mg/dL (ref 0.3–1.2)
Total Protein: 7.7 g/dL (ref 6.5–8.1)

## 2022-08-01 NOTE — [facility] Group Notes (Signed)
LCSW Group Therapy Note  08/01/2022 1:15pm  Type of Therapy/Topic:  Group Therapy:  Balance in Life  Participation Level:  Active  Description of Group:    This group will address the concept of balance and how it feels and looks when one is unbalanced. Patients will be encouraged to process areas in their lives that are out of balance and identify reasons for remaining unbalanced. Facilitators will guide patients in utilizing problem-solving interventions to address and correct the stressor making their life unbalanced. Understanding and applying boundaries will be explored and addressed for obtaining and maintaining a balanced life. Patients will be encouraged to explore ways to assertively make their unbalanced needs known to significant others in their lives, using other group members and facilitator for support and feedback.  Therapeutic Goals: Patient will identify two or more emotions or situations they have that consume much of in their lives. Patient will identify signs/triggers that life has become out of balance:  Patient will identify two ways to set boundaries in order to achieve balance in their lives:  Patient will demonstrate ability to communicate their needs through discussion and/or role plays  Summary of Patient Progress: Pt active in group discussion, shared how her husband works long hours and being alone all the time can be difficult, shared that she tries to spend time with friends to balance this out.  Good participation.      Therapeutic Modalities:   Cognitive Behavioral Therapy Solution-Focused Therapy Assertiveness Training  Deirdre Evener 08/01/2022 2:24 PM

## 2022-08-01 NOTE — Progress Notes (Signed)
[facility] MD Progress Note  08/01/2022 11:10 AM Rhonda [name]  MRN:  [id number] Subjective: Patient is seen on rounds.  She has been pleasant and cooperative and compliant with her medication.  No side effects.  She has been in good controls.  Awaiting her blood work to his tablet her potassium level to restart her potassium. Principal Problem: MDD (major depressive disorder), recurrent severe, without psychosis ([place]) Diagnosis: Principal Problem:   MDD (major depressive disorder), recurrent severe, without psychosis ([place]) Active Problems:   MDD (major depressive disorder)  Total Time spent with patient: 15 minutes  Past Psychiatric History: Depression secondary to general medical condition.  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    Complication of anesthesia    History of low blood pressure after surgery; attributed to lying flat   Diabetes mellitus    "diet controlled" (05/03/2013)   Exertional shortness of breath    Generalized anxiety disorder    GERD (gastroesophageal reflux disease)    Gout    Heart murmur    Hyperlipidemia    Hypertension    Hypokalemia 11/08/2018   Hypothyroidism    Left bundle branch block    s/p CRT-D (04/2013)   Major depressive disorder    Migraines    On home oxygen therapy    "2L suppose to be q night" (05/03/2013)   Orthostatic hypotension 07/28/2017   Peripheral neuropathy    Feet   SVD (spontaneous vaginal delivery)    x 2   Unspecified vitamin D  deficiency 03/26/2011   Does not take meds    Past Surgical History:  Procedure Laterality Date   BI-VENTRICULAR IMPLANTABLE CARDIOVERTER DEFIBRILLATOR N/A 05/03/2013   Procedure: BI-VENTRICULAR IMPLANTABLE CARDIOVERTER DEFIBRILLATOR  (CRT-D);  Surgeon: Evans Lance, MD;  Location: [facility] CATH LAB;  Service: Cardiovascular;  Laterality: N/A;   BI-VENTRICULAR IMPLANTABLE CARDIOVERTER DEFIBRILLATOR  (CRT-D)  05/03/2013   MDT CRTD implanted by Dr Lovena Le for non ischemic cardiomyopathy   BIOPSY  05/31/2018   Procedure: BIOPSY;  Surgeon: Ronnette Juniper, MD;  Location: [facility];  Service: Gastroenterology;;   [place] N/A 04/11/2020   Procedure: [place];  Surgeon: Evans Lance, MD;  Location: [facility] CV LAB;  Service: Cardiovascular;  Laterality: N/A;   BREAST LUMPECTOMY Left 07/28/2010   CARDIAC CATHETERIZATION  2010   NORMAL CORONARY ARTERIES   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--11 SURGERIES     PT HAS REMOVABLE SPEECH BULB-TAKES IT OUT BEFORE HER SURGERY   COLONOSCOPY     COLONOSCOPY N/A 05/31/2018   Procedure: COLONOSCOPY;  Surgeon: Ronnette Juniper, MD;  Location: [facility] ENDOSCOPY;  Service: Gastroenterology;  Laterality: N/A;   HYSTEROSCOPY WITH D & C  01/20/2012   Procedure: DILATATION AND CURETTAGE /HYSTEROSCOPY;  Surgeon: Margarette Asal, MD;  Location: [facility];  Service: Gynecology;  Laterality: N/A;  with trueclear   PORT-A-CATH REMOVAL  09/23/2011   Procedure: REMOVAL PORT-A-CATH;  Surgeon: Rolm Bookbinder, MD;  Location: [facility];  Service: General;  Laterality: N/A;  Port Removal   PORTACATH PLACEMENT  2012   TONSILLECTOMY  1960's   TOTAL KNEE ARTHROPLASTY  05/10/2012   Procedure: TOTAL KNEE ARTHROPLASTY;  Surgeon: Mauri Pole, MD;  Location: [facility];  Service: Orthopedics;  Laterality: Right;   WISDOM TOOTH EXTRACTION     Family History:  Family History  Problem Relation Age of Onset   Alzheimer's disease Mother    CVA Mother    Hypertension  Mother    Cancer - Colon Father        late 67   Birth defects Paternal Uncle    Family Psychiatric  History: Unremarkable Social History:  Social History   Substance and Sexual Activity  Alcohol Use No     Social History   Substance and Sexual Activity  Drug Use No    Social History   Socioeconomic History   Marital status: Married    Spouse name: Not on file   Number of children: 2   Years of education: 18   Highest education level: Master's degree (e.g., MA, MS, MEng, MEd, MSW, MBA)  Occupational History   Occupation: retired  Tobacco Use   Smoking status: Some Days    Packs/day: 0.10    Years: 26.00    Total pack years: 2.60    Types: Cigarettes    Last attempt to quit: 03/23/2021    Years since quitting: 1.3   Smokeless tobacco: Never  Vaping Use   Vaping Use: Never used  Substance and Sexual Activity   Alcohol use: No   Drug use: No   Sexual activity: Yes  Other Topics Concern   Not on file  Social History Narrative   Tobacco Use Cigarettes: Former Smoker, Quit in 2008   No Alcohol   No recreational drug use   Diet: Regular/Low Carb   Exercise: None   Occupation: disabled   Education: Research officer, political party, masters   Children: 2   Firearms: No   Therapist, art Use: 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      Social Determinants of Health   Financial Resource Strain: Not on file  Food Insecurity: No Food Insecurity (07/27/2022)   Hunger Vital Sign    Worried About Running Out of Food in the Last Year: Never true    Ran Out of Food in the Last Year: Never true  Transportation Needs: No Transportation Needs (07/27/2022)   PRAPARE - Hydrologist (Medical): No    Lack of Transportation (Non-Medical): No  Physical Activity: Not on file  Stress: Not on file  Social Connections: Not on file   Additional Social History:                         Sleep: Good  Appetite:  Good  Current  Medications: Current Facility-Administered Medications  Medication Dose Route Frequency Provider Last Rate Last Admin   acetaminophen (TYLENOL) tablet 650 mg  650 mg Oral Q6H PRN Parks Ranger, DO   650 mg at 07/31/22 1702   alum & mag hydroxide-simeth (MAALOX/MYLANTA) 200-200-20 MG/5ML suspension 30 mL  30 mL Oral Q4H PRN Parks Ranger, DO       ascorbic acid (VITAMIN C) tablet 1,000 mg  1,000 mg Oral Daily Parks Ranger, DO  1,000 mg at 08/01/22 0817   buPROPion (WELLBUTRIN XL) 24 hr tablet 150 mg  150 mg Oral Daily Parks Ranger, DO   150 mg at 08/01/22 0818   carvedilol (COREG) tablet 3.125 mg  3.125 mg Oral BID WC Parks Ranger, DO   3.125 mg at 08/01/22 [id number]   citalopram (CELEXA) tablet 20 mg  20 mg Oral Daily Parks Ranger, DO   20 mg at 08/01/22 [id number]   clonazePAM (KLONOPIN) tablet 0.5 mg  0.5 mg Oral BID PRN Parks Ranger, DO   0.5 mg at 07/29/22 2126   colchicine tablet 0.6 mg  0.6 mg Oral BID Parks Ranger, DO   0.6 mg at 08/01/22 0818   dicyclomine (BENTYL) tablet 20 mg  20 mg Oral TID Acquanetta Belling, DO   20 mg at 08/01/22 [id number]   diphenhydrAMINE (BENADRYL) capsule 25 mg  25 mg Oral Once Caroline Sauger, NP       fluticasone (FLONASE) 50 MCG/ACT nasal spray 1 spray  1 spray Each Nare Daily Parks Ranger, DO   1 spray at 08/01/22 [id number]   gabapentin (NEURONTIN) capsule 100 mg  100 mg Oral BID Parks Ranger, DO   100 mg at 08/01/22 [id number]   haloperidol (HALDOL) tablet 5 mg  5 mg Oral TID PRN Vesta Mixer, NP       Or   haloperidol lactate (HALDOL) injection 5 mg  5 mg Intramuscular TID PRN Vesta Mixer, NP       levothyroxine (SYNTHROID) tablet 88 mcg  88 mcg Oral Q0600 Parks Ranger, DO   88 mcg at 08/01/22 [id number]   LORazepam (ATIVAN) tablet 1 mg  1 mg Oral Q4H PRN Parks Ranger, DO       magnesium hydroxide (MILK OF MAGNESIA) suspension 30 mL  30  mL Oral Daily PRN Parks Ranger, DO       memantine [facility]) tablet 5 mg  5 mg Oral Daily Parks Ranger, DO   5 mg at 08/01/22 0819   midodrine (PROAMATINE) tablet 10 mg  10 mg Oral TID WC Parks Ranger, DO   10 mg at 08/01/22 0818   multivitamin with minerals tablet 1 tablet  1 tablet Oral Daily Parks Ranger, DO   1 tablet at 08/01/22 0816   OLANZapine (ZYPREXA) tablet 5 mg  5 mg Oral Q6H PRN Parks Ranger, DO   5 mg at 07/30/22 2111   pantoprazole (PROTONIX) EC tablet 40 mg  40 mg Oral Daily Parks Ranger, DO   40 mg at 08/01/22 [id number]   QUEtiapine (SEROQUEL) tablet 50 mg  50 mg Oral QHS Parks Ranger, DO   50 mg at 07/31/22 2120   risperiDONE (RISPERDAL) tablet 0.5 mg  0.5 mg Oral [id number] Parks Ranger, DO   0.5 mg at 08/01/22 [id number]   simvastatin (ZOCOR) tablet 10 mg  10 mg Oral QPM Parks Ranger, DO   10 mg at 07/31/22 1702   spironolactone (ALDACTONE) tablet 25 mg  25 mg Oral Daily Parks Ranger, DO   25 mg at 08/01/22 0816   torsemide (DEMADEX) tablet 80 mg  80 mg Oral BID Parks Ranger, DO   80 mg at 08/01/22 [id number]   traZODone (DESYREL) tablet 100 mg  100 mg Oral QHS PRN Parks Ranger, DO   100 mg at 07/31/22 2120    Lab Results:  Results for orders placed or performed  during the hospital encounter of 07/27/22 (from the past 48 hour(s))  Lipid panel     Status: None   Collection Time: 07/31/22  1:32 PM  Result Value Ref Range   Cholesterol 133 0 - 200 mg/dL   Triglycerides 67 <150 mg/dL   HDL 51 >40 mg/dL   Total CHOL/HDL Ratio 2.6 RATIO   VLDL 13 0 - 40 mg/dL   LDL Cholesterol 69 0 - 99 mg/dL    Comment:        Total Cholesterol/HDL:CHD Risk Coronary Heart Disease Risk Table                     Men   Women  1/2 Average Risk   3.4   3.3  Average Risk       5.0   4.4  2 X Average Risk   9.6   7.1  3 X Average Risk  23.4   11.0        Use the calculated Patient  Ratio above and the CHD Risk Table to determine the patient's CHD Risk.        ATP III CLASSIFICATION (LDL):  <100     mg/dL   Optimal  100-129  mg/dL   Near or Above                    Optimal  130-159  mg/dL   Borderline  160-189  mg/dL   High  >190     mg/dL   Very High Performed at [facility], [address] [place], [place] [id number]    *Note: Due to a large number of results and/or encounters for the requested time period, some results have not been displayed. A complete set of results can be found in Results Review.    Blood Alcohol level:  Lab Results  Component Value Date   ETH <10 07/26/2022   ETH <10 [id number]    Metabolic Disorder Labs: Lab Results  Component Value Date   HGBA1C 7.0 (H) 10/09/2021   MPG 154.2 10/09/2021   MPG 151.33 10/08/2021   No results found for: "PROLACTIN" Lab Results  Component Value Date   CHOL 133 07/31/2022   TRIG 67 07/31/2022   HDL 51 07/31/2022   CHOLHDL 2.6 07/31/2022   VLDL 13 07/31/2022   LDLCALC 69 07/31/2022   LDLCALC  08/05/2009    92        Total Cholesterol/HDL:CHD Risk Coronary Heart Disease Risk Table                     Men   Women  1/2 Average Risk   3.4   3.3  Average Risk       5.0   4.4  2 X Average Risk   9.6   7.1  3 X Average Risk  23.4   11.0        Use the calculated Patient Ratio above and the CHD Risk Table to determine the patient's CHD Risk.        ATP III CLASSIFICATION (LDL):  <100     mg/dL   Optimal  100-129  mg/dL   Near or Above                    Optimal  130-159  mg/dL   Borderline  160-189  mg/dL   High  >190     mg/dL   Very High    Physical  Findings: AIMS:  , ,  ,  ,    CIWA:    COWS:     Musculoskeletal: Strength & Muscle Tone: within normal limits Gait & Station: normal Patient leans: N/A  Psychiatric Specialty Exam:  Presentation  General Appearance:  Appropriate for Environment  Eye Contact: Good  Speech: Clear and Coherent  Speech  Volume: Normal  Handedness:No data recorded  Mood and Affect  Mood: Anxious  Affect: Congruent   Thought Process  Thought Processes: Coherent; Goal Directed  Descriptions of Associations:Intact  Orientation:Partial  Thought Content:WDL  History of Schizophrenia/Schizoaffective disorder:No  Duration of Psychotic Symptoms:No data recorded Hallucinations:No data recorded Ideas of Reference:None  Suicidal Thoughts:No data recorded Homicidal Thoughts:No data recorded  Sensorium  Memory: Immediate Fair; Recent Fair  Judgment: Fair  Insight: Poor   Executive Functions  Concentration: Fair  Attention Span: Fair  Recall: [place] of Knowledge: Good  Language: Good   Psychomotor Activity  Psychomotor Activity:No data recorded  Assets  Assets: Leisure Time; Physical Health; Resilience   Sleep  Sleep:No data recorded    Blood pressure (!) 125/56, pulse 94, temperature 99.4 F (37.4 C), temperature source Oral, resp. rate 18, height 5' (1.524 m), weight 81.4 kg, SpO2 96 %. Body mass index is 35.06 kg/m.   Treatment Plan Summary: Daily contact with patient to assess and evaluate symptoms and progress in treatment, Medication management, and Plan continue current medications.  Awaiting blood work.  [place], DO 08/01/2022, 11:10 AM

## 2022-08-01 NOTE — Progress Notes (Signed)
The patient was cooperative with treatment, She denies SI,HI & AVH. She seemed to have a good visit with her husband, she spent most of the evening in the dayroom. No new issues to report on shift at this time.

## 2022-08-01 NOTE — [facility] Group Notes (Signed)
[place] Group Notes:  (Nursing/MHT/Case Management/Adjunct)  Date:  08/01/2022  Time:  9:45 AM  Type of Therapy:   community meeting  Participation Level:  Did Not Attend    [name] 08/01/2022, 9:45 AM

## 2022-08-01 NOTE — Progress Notes (Signed)
Patient denies SI, HI, and AVH. She is calm and cooperative with assessment. She denies pain this morning and says the Tylenol last night helped with the pain. She was able to transfer with standby assist and use her front wheel walker to walk to the bathroom and back and forth from her room to the dayroom. Patient returned to her room after breakfast. Bed alarm turned on. Patient remains safe on the unit at this time.

## 2022-08-02 DIAGNOSIS — F332 Major depressive disorder, recurrent severe without psychotic features: Secondary | ICD-10-CM | POA: Diagnosis not present

## 2022-08-02 MED ORDER — POTASSIUM CHLORIDE CRYS ER 20 MEQ PO TBCR
40.0000 meq | EXTENDED_RELEASE_TABLET | Freq: Three times a day (TID) | ORAL | Status: DC
  Administered 2022-08-02 – 2022-08-06 (×12): 40 meq via ORAL
  Filled 2022-08-02 (×12): qty 2

## 2022-08-02 NOTE — Plan of Care (Signed)
  Problem: Activity: Goal: Risk for activity intolerance will decrease Outcome: Progressing   Problem: Nutrition: Goal: Adequate nutrition will be maintained Outcome: Progressing   Problem: Coping: Goal: Level of anxiety will decrease Outcome: Progressing   Problem: Safety: Goal: Ability to remain free from injury will improve Outcome: Progressing   

## 2022-08-02 NOTE — Group Note (Signed)
Date:  08/02/2022 Time:  11:41 AM  Group Topic / Focus: Community Group  Participation Level:  Did Not Attend  Participation Quality:   Did not attend  Affect:  Did not attend  Cognitive:  Did not attend  Insight: Did not attend  Engagement in Group:  Did not attend  Modes of Intervention:  Did not attend  Additional Comments:  Community meeting of unit expectations  [place], Amy T 08/02/2022, 11:41 AM

## 2022-08-02 NOTE — Group Note (Signed)
Date:  08/02/2022 Time:  11:55 AM  Group Topic/Focus:  Personal Choices and Values:   The focus of this group is to help patients assess and explore the importance of values in their lives, how their values affect their decisions, how they express their values and what opposes their expression.    Participation Level:  Did Not Attend  Participation Quality:   Did Not Attend  Affect:   Did Not Attend  Cognitive:   Did Not Attend  Insight: None Did Not Attend  Engagement in Group:   Did Not Attend  Modes of Intervention:  Activity, Discussion, and Socialization  Additional Comments:  Patient did not attend:  activity on past activities that patient enjoyed and talked about positive feelings those activities gave to the patient. Reflection on the importance of positive, personal choices.   Luis Abed 08/02/2022, 11:55 AM

## 2022-08-02 NOTE — Progress Notes (Signed)
The patient was cooperative with treatment, She denies SI,HI & AVH. She seemed to have a good visit with her husband, she spent most of the evening in the dayroom.  She kept complaining that her room was cold , she was offered two more blankets and clothes to help. Patient is currently resting in bed eyes closed.

## 2022-08-02 NOTE — Progress Notes (Signed)
Patient dropped a  half of a pill during medication administration.  Pill was likely to be Risperdal, but unable to [id number] certain.  MD made aware.

## 2022-08-02 NOTE — Progress Notes (Signed)
[facility] MD Progress Note  08/02/2022 11:19 AM Rhonda [name]  MRN:  [id number] Subjective: Rhonda [name] is seen on rounds.  She has no complaints.  Nurses report no issues.  She is pleasant cooperative and compliant with her medications. Principal Problem: MDD (major depressive disorder), recurrent severe, without psychosis ([place]) Diagnosis: Principal Problem:   MDD (major depressive disorder), recurrent severe, without psychosis ([place]) Active Problems:   MDD (major depressive disorder)  Total Time spent with patient: 15 minutes  Past Psychiatric History: Depression  Past Medical History:  Past Medical History:  Diagnosis Date   Anemia    Arthritis    Right knee   Asthma    Back pain    Disk problem   Breast cancer, stage 1 ([place]) 03/26/2011   Left; completed chemotherapy and radiation treatments   Cardiomyopathy    Chronic respiratory failure ([place]) [id number]   Chronic systolic heart failure (HCC)    a) NICM b) ECHO (03/2013) EF 20-25% c) ECHO (09/2013) EF 45-50%, grade I DD   CKD (chronic kidney disease) stage 3, GFR 30-59 ml/min (HCC) 11/08/2018   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    Complication of anesthesia    History of low blood pressure after surgery; attributed to lying flat   Diabetes mellitus    "diet controlled" (05/03/2013)   Exertional shortness of breath    Generalized anxiety disorder    GERD (gastroesophageal reflux disease)    Gout    Heart murmur    Hyperlipidemia    Hypertension    Hypokalemia 11/08/2018   Hypothyroidism    Left bundle branch block    s/p CRT-D (04/2013)   Major depressive disorder    Migraines    On home oxygen therapy    "2L suppose to be q night" (05/03/2013)   Orthostatic hypotension 07/28/2017   Peripheral neuropathy    Feet   SVD (spontaneous vaginal delivery)    x 2   Unspecified vitamin D deficiency 03/26/2011   Does not take meds    Past Surgical History:  Procedure Laterality Date   BI-VENTRICULAR IMPLANTABLE  CARDIOVERTER DEFIBRILLATOR N/A 05/03/2013   Procedure: BI-VENTRICULAR IMPLANTABLE CARDIOVERTER DEFIBRILLATOR  (CRT-D);  Surgeon: Evans Lance, MD;  Location: [facility] CATH LAB;  Service: Cardiovascular;  Laterality: N/A;   BI-VENTRICULAR IMPLANTABLE CARDIOVERTER DEFIBRILLATOR  (CRT-D)  05/03/2013   MDT CRTD implanted by Dr Lovena Le for non ischemic cardiomyopathy   BIOPSY  05/31/2018   Procedure: BIOPSY;  Surgeon: Ronnette Juniper, MD;  Location: [facility];  Service: Gastroenterology;;   [place] N/A 04/11/2020   Procedure: [place];  Surgeon: Evans Lance, MD;  Location: [place] CV LAB;  Service: Cardiovascular;  Laterality: N/A;   BREAST LUMPECTOMY Left 07/28/2010   CARDIAC CATHETERIZATION  2010   NORMAL CORONARY ARTERIES   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--11 SURGERIES     PT HAS REMOVABLE SPEECH BULB-TAKES IT OUT BEFORE HER SURGERY   COLONOSCOPY     COLONOSCOPY N/A 05/31/2018   Procedure: COLONOSCOPY;  Surgeon: Ronnette Juniper, MD;  Location: [facility] ENDOSCOPY;  Service: Gastroenterology;  Laterality: N/A;   HYSTEROSCOPY WITH D & C  01/20/2012   Procedure: DILATATION AND CURETTAGE /HYSTEROSCOPY;  Surgeon: Margarette Asal, MD;  Location: [facility];  Service: Gynecology;  Laterality: N/A;  with trueclear   PORT-A-CATH REMOVAL  09/23/2011   Procedure: REMOVAL PORT-A-CATH;  Surgeon: Rolm Bookbinder, MD;  Location: [facility];  Service: General;  Laterality: N/A;  Port  Removal   PORTACATH PLACEMENT  2012   TONSILLECTOMY  1960's   TOTAL KNEE ARTHROPLASTY  05/10/2012   Procedure: TOTAL KNEE ARTHROPLASTY;  Surgeon: Mauri Pole, MD;  Location: [facility];  Service: Orthopedics;  Laterality: Right;   WISDOM TOOTH EXTRACTION     Family History:  Family History  Problem Relation Age of Onset   Alzheimer's disease Mother    CVA Mother    Hypertension Mother    Cancer - Colon Father        late 68   Birth defects Paternal Uncle    Family Psychiatric  History:  Unremarkable Social History:  Social History   Substance and Sexual Activity  Alcohol Use No     Social History   Substance and Sexual Activity  Drug Use No    Social History   Socioeconomic History   Marital status: Married    Spouse name: Not on file   Number of children: 2   Years of education: 18   Highest education level: Master's degree (e.g., MA, MS, MEng, MEd, MSW, MBA)  Occupational History   Occupation: retired  Tobacco Use   Smoking status: Some Days    Packs/day: 0.10    Years: 26.00    Total pack years: 2.60    Types: Cigarettes    Last attempt to quit: 03/23/2021    Years since quitting: 1.3   Smokeless tobacco: Never  Vaping Use   Vaping Use: Never used  Substance and Sexual Activity   Alcohol use: No   Drug use: No   Sexual activity: Yes  Other Topics Concern   Not on file  Social History Narrative   Tobacco Use Cigarettes: Former Smoker, Quit in 2008   No Alcohol   No recreational drug use   Diet: Regular/Low Carb   Exercise: None   Occupation: disabled   Education: Research officer, political party, masters   Children: 2   Firearms: No   Therapist, art Use: 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      Social Determinants of Health   Financial Resource Strain: Not on file  Food Insecurity: No Food Insecurity (07/27/2022)   Hunger Vital Sign    Worried About Running Out of Food in the Last Year: Never true    Ran Out of Food in the Last Year: Never true  Transportation Needs: No Transportation Needs (07/27/2022)   PRAPARE - Hydrologist (Medical): No    Lack of Transportation (Non-Medical): No  Physical Activity: Not on file  Stress: Not on file  Social Connections: Not on file   Additional Social History:                         Sleep: Good  Appetite:  Good  Current Medications: Current Facility-Administered Medications  Medication Dose Route Frequency Provider Last Rate Last Admin    acetaminophen (TYLENOL) tablet 650 mg  650 mg Oral Q6H PRN Parks Ranger, DO   650 mg at 08/02/22 0930   alum & mag hydroxide-simeth (MAALOX/MYLANTA) 200-200-20 MG/5ML suspension 30 mL  30 mL Oral Q4H PRN Parks Ranger, DO       ascorbic acid (VITAMIN C) tablet 1,000 mg  1,000 mg Oral Daily Parks Ranger, DO   1,000 mg at 08/02/22 [id number]   buPROPion (WELLBUTRIN XL) 24 hr tablet 150 mg  150 mg Oral Daily Caren Griffins  Edward, DO   150 mg at 08/02/22 [id number]   carvedilol (COREG) tablet 3.125 mg  3.125 mg Oral BID WC Parks Ranger, DO   3.125 mg at 08/01/22 1629   citalopram (CELEXA) tablet 20 mg  20 mg Oral Daily Parks Ranger, DO   20 mg at 08/02/22 [id number]   clonazePAM (KLONOPIN) tablet 0.5 mg  0.5 mg Oral BID PRN Parks Ranger, DO   0.5 mg at 07/29/22 2126   colchicine tablet 0.6 mg  0.6 mg Oral BID Parks Ranger, DO   0.6 mg at 08/02/22 [id number]   dicyclomine (BENTYL) tablet 20 mg  20 mg Oral TID Acquanetta Belling, DO   20 mg at 08/02/22 [id number]   diphenhydrAMINE (BENADRYL) capsule 25 mg  25 mg Oral Once Caroline Sauger, NP       fluticasone [facility]) 50 MCG/ACT nasal spray 1 spray  1 spray Each Nare Daily Parks Ranger, DO   1 spray at 08/02/22 [id number]   gabapentin (NEURONTIN) capsule 100 mg  100 mg Oral BID Parks Ranger, DO   100 mg at 08/02/22 [id number]   haloperidol (HALDOL) tablet 5 mg  5 mg Oral TID PRN Vesta Mixer, NP       Or   haloperidol lactate (HALDOL) injection 5 mg  5 mg Intramuscular TID PRN Vesta Mixer, NP       levothyroxine (SYNTHROID) tablet 88 mcg  88 mcg Oral Q0600 Parks Ranger, DO   88 mcg at 08/02/22 [id number]   LORazepam (ATIVAN) tablet 1 mg  1 mg Oral Q4H PRN Parks Ranger, DO       magnesium hydroxide (MILK OF MAGNESIA) suspension 30 mL  30 mL Oral Daily PRN Parks Ranger, DO       memantine [facility]) tablet 5 mg  5 mg Oral Daily Parks Ranger, DO   5 mg at 08/02/22 [id number]   midodrine (PROAMATINE) tablet 10 mg  10 mg Oral TID WC Parks Ranger, DO   10 mg at 08/01/22 1630   multivitamin with minerals tablet 1 tablet  1 tablet Oral Daily Parks Ranger, DO   1 tablet at 08/02/22 0938   OLANZapine (ZYPREXA) tablet 5 mg  5 mg Oral Q6H PRN Parks Ranger, DO   5 mg at 07/30/22 2111   pantoprazole (PROTONIX) EC tablet 40 mg  40 mg Oral Daily Parks Ranger, DO   40 mg at 08/02/22 [id number]   QUEtiapine (SEROQUEL) tablet 50 mg  50 mg Oral QHS Parks Ranger, DO   50 mg at 08/01/22 2126   risperiDONE (RISPERDAL) tablet 0.5 mg  0.5 mg Oral [id number] Parks Ranger, DO   0.5 mg at 08/02/22 [id number]   simvastatin (ZOCOR) tablet 10 mg  10 mg Oral QPM Parks Ranger, DO   10 mg at 08/01/22 1630   spironolactone (ALDACTONE) tablet 25 mg  25 mg Oral Daily Parks Ranger, DO   25 mg at 08/01/22 0816   torsemide (DEMADEX) tablet 80 mg  80 mg Oral BID Parks Ranger, DO   80 mg at 08/01/22 1629   traZODone (DESYREL) tablet 100 mg  100 mg Oral QHS PRN Parks Ranger, DO   100 mg at 07/31/22 2120    Lab Results:  Results for orders placed or performed during the hospital encounter of 07/27/22 (from the past 48 hour(s))  Lipid panel     Status: None  Collection Time: 07/31/22  1:32 PM  Result Value Ref Range   Cholesterol 133 0 - 200 mg/dL   Triglycerides 67 <150 mg/dL   HDL 51 >40 mg/dL   Total CHOL/HDL Ratio 2.6 RATIO   VLDL 13 0 - 40 mg/dL   LDL Cholesterol 69 0 - 99 mg/dL    Comment:        Total Cholesterol/HDL:CHD Risk Coronary Heart Disease Risk Table                     Men   Women  1/2 Average Risk   3.4   3.3  Average Risk       5.0   4.4  2 X Average Risk   9.6   7.1  3 X Average Risk  23.4   11.0        Use the calculated Patient Ratio above and the CHD Risk Table to determine the patient's CHD Risk.        ATP III CLASSIFICATION (LDL):   <100     mg/dL   Optimal  100-129  mg/dL   Near or Above                    Optimal  130-159  mg/dL   Borderline  160-189  mg/dL   High  >190     mg/dL   Very High Performed at [facility], [address] [place], [place] [address]   CBC with Differential/Platelet     Status: Abnormal   Collection Time: 08/01/22  1:10 PM  Result Value Ref Range   WBC 8.0 4.0 - 10.5 K/uL   RBC 3.44 (L) 3.87 - 5.11 MIL/uL   Hemoglobin 11.9 (L) 12.0 - 15.0 g/dL   HCT 36.3 36.0 - 46.0 %   MCV 105.5 (H) 80.0 - 100.0 fL   MCH 34.6 (H) 26.0 - 34.0 pg   MCHC 32.8 30.0 - 36.0 g/dL   RDW 13.0 11.5 - 15.5 %   Platelets 330 150 - 400 K/uL   nRBC 0.0 0.0 - 0.2 %   Neutrophils Relative % 72 %   Neutro Abs 5.8 1.7 - 7.7 K/uL   Lymphocytes Relative 16 %   Lymphs Abs 1.3 0.7 - 4.0 K/uL   Monocytes Relative 7 %   Monocytes Absolute 0.6 0.1 - 1.0 K/uL   Eosinophils Relative 3 %   Eosinophils Absolute 0.2 0.0 - 0.5 K/uL   Basophils Relative 1 %   Basophils Absolute 0.0 0.0 - 0.1 K/uL   Immature Granulocytes 1 %   Abs Immature Granulocytes 0.07 0.00 - 0.07 K/uL    Comment: Performed at [facility], [address] [place], [place] [address]  Comprehensive metabolic panel     Status: Abnormal   Collection Time: 08/01/22  1:10 PM  Result Value Ref Range   Sodium 136 135 - 145 mmol/L   Potassium 3.1 (L) 3.5 - 5.1 mmol/L   Chloride 98 98 - 111 mmol/L   CO2 26 22 - 32 mmol/L   Glucose, Bld 187 (H) 70 - 99 mg/dL    Comment: Glucose reference range applies only to samples taken after fasting for at least 8 hours.   BUN 42 (H) 8 - 23 mg/dL   Creatinine, Ser 1.47 (H) 0.44 - 1.00 mg/dL   Calcium 8.9 8.9 - 10.3 mg/dL   Total Protein 7.7 6.5 - 8.1 g/dL   Albumin 3.4 (L) 3.5 - 5.0 g/dL  AST 34 15 - 41 U/L   ALT 22 0 - 44 U/L   Alkaline Phosphatase 124 38 - 126 U/L   Total Bilirubin 0.4 0.3 - 1.2 mg/dL   GFR, Estimated 38 (L) >60 mL/min    Comment: (NOTE) Calculated using the CKD-EPI  Creatinine Equation (2021)    Anion gap 12 5 - 15    Comment: Performed at [facility], [address] [place], [place] [address]   *Note: Due to a large number of results and/or encounters for the requested time period, some results have not been displayed. A complete set of results can be found in Results Review.    Blood Alcohol level:  Lab Results  Component Value Date   ETH <10 07/26/2022   ETH <10 [id number]    Metabolic Disorder Labs: Lab Results  Component Value Date   HGBA1C 7.0 (H) 10/09/2021   MPG 154.2 10/09/2021   MPG 151.33 10/08/2021   No results found for: "PROLACTIN" Lab Results  Component Value Date   CHOL 133 07/31/2022   TRIG 67 07/31/2022   HDL 51 07/31/2022   CHOLHDL 2.6 07/31/2022   VLDL 13 07/31/2022   LDLCALC 69 07/31/2022   LDLCALC  08/05/2009    92        Total Cholesterol/HDL:CHD Risk Coronary Heart Disease Risk Table                     Men   Women  1/2 Average Risk   3.4   3.3  Average Risk       5.0   4.4  2 X Average Risk   9.6   7.1  3 X Average Risk  23.4   11.0        Use the calculated Patient Ratio above and the CHD Risk Table to determine the patient's CHD Risk.        ATP III CLASSIFICATION (LDL):  <100     mg/dL   Optimal  100-129  mg/dL   Near or Above                    Optimal  130-159  mg/dL   Borderline  160-189  mg/dL   High  >190     mg/dL   Very High    Physical Findings: AIMS:  , ,  ,  ,    CIWA:    COWS:     Musculoskeletal: Strength & Muscle Tone: within normal limits Gait & Station: normal Patient leans: N/A  Psychiatric Specialty Exam:  Presentation  General Appearance:  Appropriate for Environment  Eye Contact: Good  Speech: Clear and Coherent  Speech Volume: Normal  Handedness:No data recorded  Mood and Affect  Mood: Anxious  Affect: Congruent   Thought Process  Thought Processes: Coherent; Goal Directed  Descriptions of  Associations:Intact  Orientation:Partial  Thought Content:WDL  History of Schizophrenia/Schizoaffective disorder:No  Duration of Psychotic Symptoms:No data recorded Hallucinations:No data recorded Ideas of Reference:None  Suicidal Thoughts:No data recorded Homicidal Thoughts:No data recorded  Sensorium  Memory: Immediate Fair; Recent Fair  Judgment: Fair  Insight: Poor   Executive Functions  Concentration: Fair  Attention Span: Fair  Recall: [place] of Knowledge: Good  Language: Good   Psychomotor Activity  Psychomotor Activity:No data recorded  Assets  Assets: Leisure Time; Physical Health; Resilience   Sleep  Sleep:No data recorded    Blood pressure 108/67, pulse 87, temperature 98.1 F (36.7 C), resp. rate  18, height 5' (1.524 m), weight 83.2 kg, SpO2 96 %. Body mass index is 35.84 kg/m.   Treatment Plan Summary: Daily contact with patient to assess and evaluate symptoms and progress in treatment, Medication management, and Plan continue current medications.  Parks Ranger, DO 08/02/2022, 11:19 AM

## 2022-08-02 NOTE — Progress Notes (Signed)
Patient pleasant with staff this morning.  Patient denies feelings of anxiety or depression.  Denies SI/HI and AVH.  Patient reports she slept well.  Pain rated 8.5/10 for headache.  PRN Tylenol given.  Compliant with scheduled medications.  15 min checks in place for safety.  Patient isolated to room after breakfast.  Appropriate interaction with peers when in milieu. Patient did attend SW group. Showered independently.   Patient's weight obtained after breakfast, prior to eating lunch.    BP medications and diuretics held this am per MD (BP 108/67).

## 2022-08-02 NOTE — Plan of Care (Signed)
  Problem: Activity: Goal: Risk for activity intolerance will decrease Outcome: Progressing   Problem: Coping: Goal: Level of anxiety will decrease Outcome: Progressing   Problem: Pain Managment: Goal: General experience of comfort will improve Outcome: Progressing   

## 2022-08-02 NOTE — [facility] Group Notes (Signed)
LCSW Wellness Group Note   08/02/2022 1:45pm  Type of Group and Topic: Psychoeducational Group:  Wellness  Participation Level:  Active  Description of Group  Wellness group introduces the topic and its focus on developing healthy habits across the spectrum and its relationship to a decrease in hospital admissions.  Six areas of wellness are discussed: physical, social spiritual, intellectual, occupational, and emotional.  Patients are asked to consider their current wellness habits and to identify areas of wellness where they are interested and able to focus on improvements.    Therapeutic Goals Patients will understand components of wellness and how they can positively impact overall health.  Patients will identify areas of wellness where they have developed good habits. Patients will identify areas of wellness where they would like to make improvements.    Summary of Patient Progress: pt once again active participant in group discussion.  Pt identified intellectual (artistic ability) and financial as wellness areas she was good at and emotional wellness as an area that need improvement.     Therapeutic Modalities: Cognitive Behavioral Therapy Psychoeducation    Joanne Chars, LCSW

## 2022-08-03 DIAGNOSIS — F332 Major depressive disorder, recurrent severe without psychotic features: Secondary | ICD-10-CM | POA: Diagnosis not present

## 2022-08-03 NOTE — BH IP Treatment Plan (Signed)
Interdisciplinary Treatment and Diagnostic Plan Update  08/03/2022 Time of Session: 9:00AM Rhonda [name] MRN: [id number]  Principal Diagnosis: MDD (major depressive disorder), recurrent severe, without psychosis ([place])  Secondary Diagnoses: Principal Problem:   MDD (major depressive disorder), recurrent severe, without psychosis ([place]) Active Problems:   MDD (major depressive disorder)   Current Medications:  Current Facility-Administered Medications  Medication Dose Route Frequency Provider Last Rate Last Admin   acetaminophen (TYLENOL) tablet 650 mg  650 mg Oral Q6H PRN Parks Ranger, DO   650 mg at 08/02/22 2314   alum & mag hydroxide-simeth (MAALOX/MYLANTA) 200-200-20 MG/5ML suspension 30 mL  30 mL Oral Q4H PRN Parks Ranger, DO       ascorbic acid (VITAMIN C) tablet 1,000 mg  1,000 mg Oral Daily Parks Ranger, DO   1,000 mg at 08/03/22 0901   buPROPion (WELLBUTRIN XL) 24 hr tablet 150 mg  150 mg Oral Daily Parks Ranger, DO   150 mg at 08/03/22 0902   carvedilol (COREG) tablet 3.125 mg  3.125 mg Oral BID WC Parks Ranger, DO   3.125 mg at 08/03/22 0902   citalopram (CELEXA) tablet 20 mg  20 mg Oral Daily Parks Ranger, DO   20 mg at 08/03/22 [id number]   clonazePAM (KLONOPIN) tablet 0.5 mg  0.5 mg Oral BID PRN Parks Ranger, DO   0.5 mg at 07/29/22 2126   colchicine tablet 0.6 mg  0.6 mg Oral BID Parks Ranger, DO   0.6 mg at 08/03/22 [id number]   dicyclomine (BENTYL) tablet 20 mg  20 mg Oral TID AC Parks Ranger, DO   20 mg at 08/03/22 [id number]   diphenhydrAMINE (BENADRYL) capsule 25 mg  25 mg Oral Once Caroline Sauger, NP       fluticasone (FLONASE) 50 MCG/ACT nasal spray 1 spray  1 spray Each Nare Daily Parks Ranger, DO   1 spray at 08/03/22 0904   gabapentin (NEURONTIN) capsule 100 mg  100 mg Oral BID Parks Ranger, DO   100 mg at 08/03/22 [id number]   haloperidol (HALDOL) tablet 5 mg  5  mg Oral TID PRN Vesta Mixer, NP       Or   haloperidol lactate (HALDOL) injection 5 mg  5 mg Intramuscular TID PRN Vesta Mixer, NP       levothyroxine (SYNTHROID) tablet 88 mcg  88 mcg Oral Q0600 Parks Ranger, DO   88 mcg at 08/03/22 [id number]   LORazepam (ATIVAN) tablet 1 mg  1 mg Oral Q4H PRN Parks Ranger, DO       magnesium hydroxide (MILK OF MAGNESIA) suspension 30 mL  30 mL Oral Daily PRN Parks Ranger, DO       memantine [facility]) tablet 5 mg  5 mg Oral Daily Parks Ranger, DO   5 mg at 08/03/22 0902   midodrine (PROAMATINE) tablet 10 mg  10 mg Oral TID WC Parks Ranger, DO   10 mg at 08/03/22 0902   multivitamin with minerals tablet 1 tablet  1 tablet Oral Daily Parks Ranger, DO   1 tablet at 08/03/22 0902   OLANZapine (ZYPREXA) tablet 5 mg  5 mg Oral Q6H PRN Parks Ranger, DO   5 mg at 07/30/22 2111   pantoprazole (PROTONIX) EC tablet 40 mg  40 mg Oral Daily Parks Ranger, DO   40 mg at 08/03/22 0903   potassium chloride SA (KLOR-CON M) CR tablet 40  mEq  40 mEq Oral TID Parks Ranger, DO   40 mEq at 08/03/22 0901   QUEtiapine (SEROQUEL) tablet 50 mg  50 mg Oral QHS Parks Ranger, DO   50 mg at 08/02/22 2119   risperiDONE (RISPERDAL) tablet 0.5 mg  0.5 mg Oral [id number] Parks Ranger, DO   0.5 mg at 08/03/22 [id number]   simvastatin (ZOCOR) tablet 10 mg  10 mg Oral QPM Parks Ranger, DO   10 mg at 08/02/22 1700   spironolactone (ALDACTONE) tablet 25 mg  25 mg Oral Daily Parks Ranger, DO   25 mg at 08/03/22 0902   torsemide (DEMADEX) tablet 80 mg  80 mg Oral BID Parks Ranger, DO   80 mg at 08/03/22 0902   traZODone (DESYREL) tablet 100 mg  100 mg Oral QHS PRN Parks Ranger, DO   100 mg at 07/31/22 2120   PTA Medications: Medications Prior to Admission  Medication Sig Dispense Refill Last Dose   acetaminophen (TYLENOL) 500 MG tablet Take  1,000 mg by mouth daily as needed (for pain and fever of 101 or greater).      acetaZOLAMIDE (DIAMOX) 125 MG tablet Take 1 tablet (125 mg total) by mouth daily. 90 tablet 3    Ascorbic Acid (VITAMIN C) 1000 MG tablet Take 1,000 mg by mouth daily.      buPROPion (WELLBUTRIN XL) 150 MG 24 hr tablet Take 150 mg by mouth daily.      carvedilol (COREG) 3.125 MG tablet TAKE 1 TABLET (3.125 MG) BY MOUTH TWICE DAILY WITH MEALS (Patient taking differently: Take 3.125 mg by mouth 2 (two) times daily with a meal. TAKE 1 TABLET (3.125 MG) BY MOUTH TWICE DAILY WITH MEALS) 180 tablet 3    citalopram (CELEXA) 20 MG tablet Take 20 mg by mouth daily.      clonazePAM (KLONOPIN) 0.5 MG tablet Take 0.5 mg by mouth 2 (two) times daily as needed for anxiety.      cyanocobalamin (VITAMIN B12) 1000 MCG tablet Take 1,000 mcg by mouth daily.      dicyclomine (BENTYL) 20 MG tablet Take 20 mg by mouth 3 (three) times daily before meals.      fluticasone (FLONASE) 50 MCG/ACT nasal spray Place 1 spray into both nostrils daily.      gabapentin (NEURONTIN) 100 MG capsule Take 1 capsule (100 mg total) by mouth 2 (two) times daily. 180 capsule 3    levothyroxine (SYNTHROID) 88 MCG tablet TAKE 1 TABLET(88 MCG) BY MOUTH DAILY BEFORE BREAKFAST (Patient taking differently: Take 88 mcg by mouth daily before breakfast. TAKE 1 TABLET(88 MCG) BY MOUTH DAILY BEFORE BREAKFAST) 90 tablet 3    metolazone (ZAROXOLYN) 2.5 MG tablet TAKE 1 TABLET BY MOUTH EVERY OTHER TUESDAY. (Patient taking differently: Take 2.5 mg by mouth. Take 1 tablet by mouth every other Tuesday.) 12 tablet 3    midodrine (PROAMATINE) 10 MG tablet TAKE 1 TABLET THREE TIMES DAILY WITH MEALS (Patient taking differently: Take 10 mg by mouth 3 (three) times daily. TAKE 1 TABLET THREE TIMES DAILY WITH MEALS) 270 tablet 3    Multiple Vitamin (MULTIVITAMIN WITH MINERALS) TABS tablet Take 1 tablet by mouth daily.      nystatin (MYCOSTATIN) 100000 UNIT/ML suspension Take 5 mLs by mouth  4 (four) times daily.      ondansetron (ZOFRAN) 4 MG tablet Take 1 tablet (4 mg total) by mouth every 6 (six) hours. (Patient not taking: Reported on 07/20/2022) 15 tablet  0    pantoprazole (PROTONIX) 40 MG tablet Take 40 mg by mouth daily as needed (for acid reflux).      potassium chloride SA (KLOR-CON M) 20 MEQ tablet TAKE 2 TABLETS THREE TIMES DAILY. TAKE AN EXTRA 2 TABLETS ON DAYS YOU TAKE METOLAZONE (Patient taking differently: Take 40 mEq by mouth 3 (three) times daily.) 572 tablet 3    simvastatin (ZOCOR) 10 MG tablet TAKE 1 TABLET EVERY EVENING (Patient taking differently: Take 10 mg by mouth daily at 6 PM.) 90 tablet 1    spironolactone (ALDACTONE) 25 MG tablet TAKE 1 TABLET (25 MG TOTAL) BY MOUTH EVERY EVENING. (Patient taking differently: Take 25 mg by mouth daily.) 90 tablet 0    tiZANidine (ZANAFLEX) 2 MG tablet Take 1 tablet (2 mg total) by mouth at bedtime as needed for muscle spasms. 90 tablet 1    torsemide (DEMADEX) 20 MG tablet Take 4 tablets (80 mg total) by mouth 2 (two) times daily. 240 tablet 6     Patient Stressors: Marital or family conflict   Substance abuse   Other: husband's substance abuse, not patient and husband not fixing the house.    Patient Strengths: Active sense of humor  Communication skills  Supportive family/friends   Treatment Modalities: Medication Management, Group therapy, Case management,  1 to 1 session with clinician, Psychoeducation, Recreational therapy.   Physician Treatment Plan for Primary Diagnosis: MDD (major depressive disorder), recurrent severe, without psychosis ([place]) Long Term Goal(s): Improvement in symptoms so as ready for discharge   Short Term Goals: Ability to identify changes in lifestyle to reduce recurrence of condition will improve Ability to verbalize feelings will improve Ability to disclose and discuss suicidal ideas Ability to demonstrate self-control will improve Ability to identify and develop effective coping  behaviors will improve Ability to maintain clinical measurements within normal limits will improve Compliance with prescribed medications will improve Ability to identify triggers associated with substance abuse/mental health issues will improve  Medication Management: Evaluate patient's response, side effects, and tolerance of medication regimen.  Therapeutic Interventions: 1 to 1 sessions, Unit Group sessions and Medication administration.  Evaluation of Outcomes: Progressing  Physician Treatment Plan for Secondary Diagnosis: Principal Problem:   MDD (major depressive disorder), recurrent severe, without psychosis ([place]) Active Problems:   MDD (major depressive disorder)  Long Term Goal(s): Improvement in symptoms so as ready for discharge   Short Term Goals: Ability to identify changes in lifestyle to reduce recurrence of condition will improve Ability to verbalize feelings will improve Ability to disclose and discuss suicidal ideas Ability to demonstrate self-control will improve Ability to identify and develop effective coping behaviors will improve Ability to maintain clinical measurements within normal limits will improve Compliance with prescribed medications will improve Ability to identify triggers associated with substance abuse/mental health issues will improve     Medication Management: Evaluate patient's response, side effects, and tolerance of medication regimen.  Therapeutic Interventions: 1 to 1 sessions, Unit Group sessions and Medication administration.  Evaluation of Outcomes: Progressing   RN Treatment Plan for Primary Diagnosis: MDD (major depressive disorder), recurrent severe, without psychosis ([place]) Long Term Goal(s): Knowledge of disease and therapeutic regimen to maintain health will improve  Short Term Goals: Ability to remain free from injury will improve, Ability to verbalize frustration and anger appropriately will improve, Ability to demonstrate  self-control, Ability to participate in decision making will improve, Ability to verbalize feelings will improve, Ability to disclose and discuss suicidal ideas, Ability to identify and  develop effective coping behaviors will improve, and Compliance with prescribed medications will improve  Medication Management: RN will administer medications as ordered by provider, will assess and evaluate patient's response and provide education to patient for prescribed medication. RN will report any adverse and/or side effects to prescribing provider.  Therapeutic Interventions: 1 on 1 counseling sessions, Psychoeducation, Medication administration, Evaluate responses to treatment, Monitor vital signs and CBGs as ordered, Perform/monitor CIWA, COWS, AIMS and Fall Risk screenings as ordered, Perform wound care treatments as ordered.  Evaluation of Outcomes: Progressing   LCSW Treatment Plan for Primary Diagnosis: MDD (major depressive disorder), recurrent severe, without psychosis ([place]) Long Term Goal(s): Safe transition to appropriate next level of care at discharge, Engage patient in therapeutic group addressing interpersonal concerns.  Short Term Goals: Engage patient in aftercare planning with referrals and resources, Increase social support, Increase ability to appropriately verbalize feelings, Increase emotional regulation, Facilitate acceptance of mental health diagnosis and concerns, and Increase skills for wellness and recovery  Therapeutic Interventions: Assess for all discharge needs, 1 to 1 time with Social worker, Explore available resources and support systems, Assess for adequacy in community support network, Educate family and significant other(s) on suicide prevention, Complete Psychosocial Assessment, Interpersonal group therapy.  Evaluation of Outcomes: Progressing   Progress in Treatment: Attending groups: Yes. Participating in groups: Yes. Taking medication as prescribed:  Yes. Toleration medication: Yes. Family/Significant other contact made: Yes, individual(s) contacted:  SPE completed with pt's husband Patient understands diagnosis: Yes. Discussing patient identified problems/goals with staff: Yes. Medical problems stabilized or resolved: No. Denies suicidal/homicidal ideation: Yes. Issues/concerns per patient self-inventory: No. Other: None  New problem(s) identified: No, Describe:  None  New Short Term/Long Term Goal(s): Patient to work towards elimination of  medication management for mood stabilization development of comprehensive mental wellness plan.    Patient Goals:  "ready to go home"   Discharge Plan or Barriers: CSW will assist pt with development of appropriate discharge/aftercare plan.    Reason for Continuation of Hospitalization: Depression Medication stabilization  Last 3 [place] Suicide Severity Risk Score: Flowsheet Row Admission (Current) from 07/27/2022 in [place] ED from 07/26/2022 in [facility] Department [facility] ED from 06/25/2022 in [facility] Emergency Department at [place] No Risk No Risk No Risk       Last PHQ 2/9 Scores:     No data to display          Scribe for Treatment Team: Sigmond Patalano A [place], Latanya Presser 08/03/2022 9:36 AM

## 2022-08-03 NOTE — Progress Notes (Signed)
[facility] MD Progress Note  08/03/2022 1:19 PM Rhonda [name]  MRN:  [id number] Subjective: Rhonda [name] is seen on rounds.  She has been compliant with medications.  She seems to be doing better since I started her on Risperdal because she is more interactive with others and not as irritable.  She is also on Namenda and doing better.  No side effects.  Nurses report no issues. Principal Problem: MDD (major depressive disorder), recurrent severe, without psychosis ([place]) Diagnosis: Principal Problem:   MDD (major depressive disorder), recurrent severe, without psychosis ([place]) Active Problems:   MDD (major depressive disorder)  Total Time spent with patient: 15 minutes  Past Psychiatric History: Depression secondary to general medical condition.  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    Complication of anesthesia    History of low blood pressure after surgery; attributed to lying flat   Diabetes mellitus    "diet controlled" (05/03/2013)   Exertional shortness of breath    Generalized anxiety disorder    GERD (gastroesophageal reflux disease)    Gout    Heart murmur    Hyperlipidemia    Hypertension    Hypokalemia 11/08/2018   Hypothyroidism    Left bundle branch block    s/p CRT-D (04/2013)   Major depressive disorder    Migraines    On home oxygen therapy    "2L suppose to be q night" (05/03/2013)   Orthostatic hypotension 07/28/2017   Peripheral neuropathy    Feet   SVD (spontaneous  vaginal delivery)    x 2   Unspecified vitamin D deficiency 03/26/2011   Does not take meds    Past Surgical History:  Procedure Laterality Date   BI-VENTRICULAR IMPLANTABLE CARDIOVERTER DEFIBRILLATOR N/A 05/03/2013   Procedure: BI-VENTRICULAR IMPLANTABLE CARDIOVERTER DEFIBRILLATOR  (CRT-D);  Surgeon: Evans Lance, MD;  Location: [facility] CATH LAB;  Service: Cardiovascular;  Laterality: N/A;   BI-VENTRICULAR IMPLANTABLE CARDIOVERTER DEFIBRILLATOR  (CRT-D)  05/03/2013   MDT CRTD implanted by Dr Lovena Le for non ischemic cardiomyopathy   BIOPSY  05/31/2018   Procedure: BIOPSY;  Surgeon: Ronnette Juniper, MD;  Location: [facility];  Service: Gastroenterology;;   [place] N/A 04/11/2020   Procedure: [place];  Surgeon: Evans Lance, MD;  Location: [place] CV LAB;  Service: Cardiovascular;  Laterality: N/A;   BREAST LUMPECTOMY Left 07/28/2010   CARDIAC CATHETERIZATION  2010   NORMAL CORONARY ARTERIES   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--11 SURGERIES     PT HAS REMOVABLE SPEECH BULB-TAKES IT OUT BEFORE HER SURGERY   COLONOSCOPY     COLONOSCOPY N/A 05/31/2018   Procedure: COLONOSCOPY;  Surgeon: Ronnette Juniper, MD;  Location: [facility] ENDOSCOPY;  Service: Gastroenterology;  Laterality: N/A;   HYSTEROSCOPY WITH D & C  01/20/2012   Procedure: DILATATION AND CURETTAGE /HYSTEROSCOPY;  Surgeon: Margarette Asal, MD;  Location: [facility];  Service:  Gynecology;  Laterality: N/A;  with trueclear   PORT-A-CATH REMOVAL  09/23/2011   Procedure: REMOVAL PORT-A-CATH;  Surgeon: Rolm Bookbinder, MD;  Location: [facility] ORS;  Service: General;  Laterality: N/A;  Port Removal   PORTACATH PLACEMENT  2012   TONSILLECTOMY  1960's   TOTAL KNEE ARTHROPLASTY  05/10/2012   Procedure: TOTAL KNEE ARTHROPLASTY;  Surgeon: Mauri Pole, MD;  Location: [facility]S;  Service: Orthopedics;  Laterality: Right;   WISDOM TOOTH EXTRACTION     Family History:  Family History  Problem Relation Age of Onset   Alzheimer's  disease Mother    CVA Mother    Hypertension Mother    Cancer - Colon Father        late 15   Birth defects Paternal Uncle    Family Psychiatric  History: Unremarkable Social History:  Social History   Substance and Sexual Activity  Alcohol Use No     Social History   Substance and Sexual Activity  Drug Use No    Social History   Socioeconomic History   Marital status: Married    Spouse name: Not on file   Number of children: 2   Years of education: 18   Highest education level: Master's degree (e.g., MA, MS, MEng, MEd, MSW, MBA)  Occupational History   Occupation: retired  Tobacco Use   Smoking status: Some Days    Packs/day: 0.10    Years: 26.00    Total pack years: 2.60    Types: Cigarettes    Last attempt to quit: 03/23/2021    Years since quitting: 1.3   Smokeless tobacco: Never  Vaping Use   Vaping Use: Never used  Substance and Sexual Activity   Alcohol use: No   Drug use: No   Sexual activity: Yes  Other Topics Concern   Not on file  Social History Narrative   Tobacco Use Cigarettes: Former Smoker, Quit in 2008   No Alcohol   No recreational drug use   Diet: Regular/Low Carb   Exercise: None   Occupation: disabled   Education: Research officer, political party, masters   Children: 2   Firearms: No   Therapist, art Use: 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      Social Determinants of Health   Financial Resource Strain: Not on file  Food Insecurity: No Food Insecurity (07/27/2022)   Hunger Vital Sign    Worried About Running Out of Food in the Last Year: Never true    Ran Out of Food in the Last Year: Never true  Transportation Needs: No Transportation Needs (07/27/2022)   PRAPARE - Hydrologist (Medical): No    Lack of Transportation (Non-Medical): No  Physical Activity: Not on file  Stress: Not on file  Social Connections: Not on file   Additional Social History:                         Sleep:  Good  Appetite:  Good  Current Medications: Current Facility-Administered Medications  Medication Dose Route Frequency Provider Last Rate Last Admin   acetaminophen (TYLENOL) tablet 650 mg  650 mg Oral Q6H PRN Parks Ranger, DO   650 mg at 08/02/22 2314   alum & mag hydroxide-simeth (MAALOX/MYLANTA) 200-200-20 MG/5ML suspension 30 mL  30 mL Oral Q4H PRN Parks Ranger, DO       ascorbic acid (VITAMIN C) tablet  1,000 mg  1,000 mg Oral Daily Parks Ranger, DO   1,000 mg at 08/03/22 0901   buPROPion (WELLBUTRIN XL) 24 hr tablet 150 mg  150 mg Oral Daily Parks Ranger, DO   150 mg at 08/03/22 0902   carvedilol (COREG) tablet 3.125 mg  3.125 mg Oral BID WC Parks Ranger, DO   3.125 mg at 08/03/22 0902   citalopram (CELEXA) tablet 20 mg  20 mg Oral Daily Parks Ranger, DO   20 mg at 08/03/22 [id number]   clonazePAM (KLONOPIN) tablet 0.5 mg  0.5 mg Oral BID PRN Parks Ranger, DO   0.5 mg at 07/29/22 2126   colchicine tablet 0.6 mg  0.6 mg Oral BID Parks Ranger, DO   0.6 mg at 08/03/22 [id number]   dicyclomine (BENTYL) tablet 20 mg  20 mg Oral TID AC Parks Ranger, DO   20 mg at 08/03/22 1153   diphenhydrAMINE (BENADRYL) capsule 25 mg  25 mg Oral Once Caroline Sauger, NP       fluticasone (FLONASE) 50 MCG/ACT nasal spray 1 spray  1 spray Each Nare Daily Parks Ranger, DO   1 spray at 08/03/22 [id number]   gabapentin (NEURONTIN) capsule 100 mg  100 mg Oral BID Parks Ranger, DO   100 mg at 08/03/22 [id number]   haloperidol (HALDOL) tablet 5 mg  5 mg Oral TID PRN Vesta Mixer, NP       Or   haloperidol lactate (HALDOL) injection 5 mg  5 mg Intramuscular TID PRN Vesta Mixer, NP       levothyroxine (SYNTHROID) tablet 88 mcg  88 mcg Oral Q0600 Parks Ranger, DO   88 mcg at 08/03/22 [id number]   LORazepam (ATIVAN) tablet 1 mg  1 mg Oral Q4H PRN Parks Ranger, DO       magnesium hydroxide (MILK  OF MAGNESIA) suspension 30 mL  30 mL Oral Daily PRN Parks Ranger, DO       memantine [facility]) tablet 5 mg  5 mg Oral Daily Parks Ranger, DO   5 mg at 08/03/22 0902   midodrine (PROAMATINE) tablet 10 mg  10 mg Oral TID WC Parks Ranger, DO   10 mg at 08/03/22 1153   multivitamin with minerals tablet 1 tablet  1 tablet Oral Daily Parks Ranger, DO   1 tablet at 08/03/22 0902   OLANZapine (ZYPREXA) tablet 5 mg  5 mg Oral Q6H PRN Parks Ranger, DO   5 mg at 07/30/22 2111   pantoprazole (PROTONIX) EC tablet 40 mg  40 mg Oral Daily Parks Ranger, DO   40 mg at 08/03/22 [id number]   potassium chloride SA (KLOR-CON M) CR tablet 40 mEq  40 mEq Oral TID Parks Ranger, DO   40 mEq at 08/03/22 0901   QUEtiapine (SEROQUEL) tablet 50 mg  50 mg Oral QHS Parks Ranger, DO   50 mg at 08/02/22 2119   risperiDONE (RISPERDAL) tablet 0.5 mg  0.5 mg Oral [id number] Parks Ranger, DO   0.5 mg at 08/03/22 [id number]   simvastatin (ZOCOR) tablet 10 mg  10 mg Oral QPM Parks Ranger, DO   10 mg at 08/02/22 1700   spironolactone (ALDACTONE) tablet 25 mg  25 mg Oral Daily Parks Ranger, DO   25 mg at 08/03/22 0902   torsemide (DEMADEX) tablet 80 mg  80 mg Oral BID Parks Ranger, DO   80  mg at 08/03/22 0902   traZODone (DESYREL) tablet 100 mg  100 mg Oral QHS PRN Parks Ranger, DO   100 mg at 07/31/22 2120    Lab Results:  Results for orders placed or performed during the hospital encounter of 07/27/22 (from the past 48 hour(s))  CBC with Differential/Platelet     Status: Abnormal   Collection Time: 08/01/22  1:10 PM  Result Value Ref Range   WBC 8.0 4.0 - 10.5 K/uL   RBC 3.44 (L) 3.87 - 5.11 MIL/uL   Hemoglobin 11.9 (L) 12.0 - 15.0 g/dL   HCT 36.3 36.0 - 46.0 %   MCV 105.5 (H) 80.0 - 100.0 fL   MCH 34.6 (H) 26.0 - 34.0 pg   MCHC 32.8 30.0 - 36.0 g/dL   RDW 13.0 11.5 - 15.5 %   Platelets 330 150 - 400  K/uL   nRBC 0.0 0.0 - 0.2 %   Neutrophils Relative % 72 %   Neutro Abs 5.8 1.7 - 7.7 K/uL   Lymphocytes Relative 16 %   Lymphs Abs 1.3 0.7 - 4.0 K/uL   Monocytes Relative 7 %   Monocytes Absolute 0.6 0.1 - 1.0 K/uL   Eosinophils Relative 3 %   Eosinophils Absolute 0.2 0.0 - 0.5 K/uL   Basophils Relative 1 %   Basophils Absolute 0.0 0.0 - 0.1 K/uL   Immature Granulocytes 1 %   Abs Immature Granulocytes 0.07 0.00 - 0.07 K/uL    Comment: Performed at [facility], [address] [place], [place] [address]  Comprehensive metabolic panel     Status: Abnormal   Collection Time: 08/01/22  1:10 PM  Result Value Ref Range   Sodium 136 135 - 145 mmol/L   Potassium 3.1 (L) 3.5 - 5.1 mmol/L   Chloride 98 98 - 111 mmol/L   CO2 26 22 - 32 mmol/L   Glucose, Bld 187 (H) 70 - 99 mg/dL    Comment: Glucose reference range applies only to samples taken after fasting for at least 8 hours.   BUN 42 (H) 8 - 23 mg/dL   Creatinine, Ser 1.47 (H) 0.44 - 1.00 mg/dL   Calcium 8.9 8.9 - 10.3 mg/dL   Total Protein 7.7 6.5 - 8.1 g/dL   Albumin 3.4 (L) 3.5 - 5.0 g/dL   AST 34 15 - 41 U/L   ALT 22 0 - 44 U/L   Alkaline Phosphatase 124 38 - 126 U/L   Total Bilirubin 0.4 0.3 - 1.2 mg/dL   GFR, Estimated 38 (L) >60 mL/min    Comment: (NOTE) Calculated using the CKD-EPI Creatinine Equation (2021)    Anion gap 12 5 - 15    Comment: Performed at [facility], [address], [place], [place] [address]   *Note: Due to a large number of results and/or encounters for the requested time period, some results have not been displayed. A complete set of results can be found in Results Review.    Blood Alcohol level:  Lab Results  Component Value Date   ETH <10 07/26/2022   ETH <10 99991111    Metabolic Disorder Labs: Lab Results  Component Value Date   HGBA1C 7.0 (H) 10/09/2021   MPG 154.2 10/09/2021   MPG 151.33 10/08/2021   No results found for: "PROLACTIN" Lab Results  Component  Value Date   CHOL 133 07/31/2022   TRIG 67 07/31/2022   HDL 51 07/31/2022   CHOLHDL 2.6 07/31/2022   VLDL 13 07/31/2022  LDLCALC 69 07/31/2022   [place]  08/05/2009    92        Total Cholesterol/HDL:CHD Risk Coronary Heart Disease Risk Table                     Men   Women  1/2 Average Risk   3.4   3.3  Average Risk       5.0   4.4  2 X Average Risk   9.6   7.1  3 X Average Risk  23.4   11.0        Use the calculated Patient Ratio above and the CHD Risk Table to determine the patient's CHD Risk.        ATP III CLASSIFICATION (LDL):  <100     mg/dL   Optimal  100-129  mg/dL   Near or Above                    Optimal  130-159  mg/dL   Borderline  160-189  mg/dL   High  >190     mg/dL   Very High    Physical Findings: AIMS:  , ,  ,  ,    CIWA:    COWS:     Musculoskeletal: Strength & Muscle Tone: within normal limits Gait & Station: normal Patient leans: N/A  Psychiatric Specialty Exam:  Presentation  General Appearance:  Appropriate for Environment  Eye Contact: Good  Speech: Clear and Coherent  Speech Volume: Normal  Handedness:No data recorded  Mood and Affect  Mood: Anxious  Affect: Congruent   Thought Process  Thought Processes: Coherent; Goal Directed  Descriptions of Associations:Intact  Orientation:Partial  Thought Content:WDL  History of Schizophrenia/Schizoaffective disorder:No  Duration of Psychotic Symptoms:No data recorded Hallucinations:No data recorded Ideas of Reference:None  Suicidal Thoughts:No data recorded Homicidal Thoughts:No data recorded  Sensorium  Memory: Immediate Fair; Recent Fair  Judgment: Fair  Insight: Poor   Executive Functions  Concentration: Fair  Attention Span: Fair  Recall: Marty of Knowledge: Good  Language: Good   Psychomotor Activity  Psychomotor Activity:No data recorded  Assets  Assets: Leisure Time; Physical Health; Resilience   Sleep  Sleep:No data  recorded   Physical Exam: Physical Exam Vitals and nursing note reviewed.  Constitutional:      Appearance: Normal appearance. She is normal weight.  Neurological:     General: No focal deficit present.     Mental Status: She is alert and oriented to person, place, and time.  Psychiatric:        Attention and Perception: Attention and perception normal.        Mood and Affect: Mood and affect normal.        Speech: Speech normal.        Behavior: Behavior normal. Behavior is cooperative.        Thought Content: Thought content normal.        Cognition and Memory: Cognition and memory normal.        Judgment: Judgment normal.    Review of Systems  Constitutional: Negative.   HENT: Negative.    Eyes: Negative.   Respiratory: Negative.    Cardiovascular: Negative.   Gastrointestinal: Negative.   Genitourinary: Negative.   Musculoskeletal: Negative.   Skin: Negative.   Neurological: Negative.   Endo/Heme/Allergies: Negative.   Psychiatric/Behavioral: Negative.     Blood pressure 120/78, pulse (!) 110, temperature 98.3 F (36.8 C), resp. rate 18, height 5' (1.524 m),  weight 83.3 kg, SpO2 100 %. Body mass index is 35.87 kg/m.   Treatment Plan Summary: Daily contact with patient to assess and evaluate symptoms and progress in treatment, Medication management, and Plan continue current medications.  [place], DO 08/03/2022, 1:19 PM

## 2022-08-03 NOTE — Progress Notes (Signed)
Patient in bedroom upon approach  Pleasant is pleasant and cooperative.  Denies any feelings of anxiety or depression.  Denies SI/HI and AVH. Denies pain at this time.  Patient reports she slept well.  Compliant with morning weight (before breakfast) and scheduled medications. 15 min checks in place for safety.  Patient in the dayroom for breakfast.  Appropriate interaction with peers.  Patient attended SW and RT groups.

## 2022-08-03 NOTE — Group Note (Signed)
Recreation Therapy Group Note   Group Topic:Self-Esteem  Group Date: 08/03/2022 Start Time: 1400 End Time: 1450 Facilitators: Vilma Prader, LRT, CTRS Location:  Dayroom  Group Description: Patients and LRT discussed the importance of self-love and self-esteem. Pt completed a worksheet that helps them identify 24 different strengths and qualities about themselves. Pt encouraged to read aloud at least 3 off their sheet to the group. LRT and pts discussed how this can be applied to daily life post-discharge.  Pt's then played "Positive Affirmation Bingo" afterwards, with journals or stress balls as bingo prizes.   Affect/Mood: Appropriate and Congruent   Participation Level: Active and Engaged   Participation Quality: Independent   Behavior: Alert, Appropriate, and Calm   Speech/Thought Process: Coherent   Insight: Good   Judgement: Good   Modes of Intervention: Activity and Guided Discussion   Patient Response to Interventions:  Attentive, Engaged, and Interested    Education Outcome:  Acknowledges education   Clinical Observations/Individualized Feedback: Khristy was active in their participation of session activities and group discussion. Pt identified "keeping my house clean, making nutritious meals for my husband, and talking with my friends" as things that she is good at. Pt also won a Restaurant manager, fast food. Pt was pleasant and interacted well with peers and LRT duration of group.    Plan: Continue to engage patient in RT group sessions 2-3x/week.   Vilma Prader, LRT, CTRS 08/03/2022 3:11 PM

## 2022-08-03 NOTE — Progress Notes (Signed)
Pt was in the dayroom visiting with her daughter at beginning of the shift. Pt also had a phone call with her husband. She was compliant with all scheduled medications and asked for Tylenol for 8/10 pain for her feet. Pt went to her room around 2200, was up till about 0100. Pt has appeared to be sleeping rest of shift.

## 2022-08-03 NOTE — [facility] Group Notes (Signed)
   [place] Group Notes:  (Nursing/MHT/Case Management/Adjunct)   Date:  08/03/2022  Time:  8:15 PM    Type of Therapy:  Wrap Up    Participation Level:  Active   Participation Quality:  Appropriate   Affect:  Appropriate   Cognitive:  Appropriate   Insight:  Appropriate   Engagement in Group:  Engaged   Modes of Intervention:  Activity and Discussion   Summary of Progress/Problems:   Rhonda [name]  08/03/2022 8:15 PM

## 2022-08-03 NOTE — Plan of Care (Signed)
°  Problem: Clinical Measurements: °Goal: Ability to maintain clinical measurements within normal limits will improve °Outcome: Progressing °  °Problem: Activity: °Goal: Risk for activity intolerance will decrease °Outcome: Progressing °  °Problem: Nutrition: °Goal: Adequate nutrition will be maintained °Outcome: Progressing °  °

## 2022-08-03 NOTE — Group Note (Signed)
[facility] LCSW Group Therapy Note    Group Date: 08/03/2022 Start Time: 1305 End Time: 1355  Type of Therapy and Topic:  Group Therapy:  Overcoming Obstacles  Participation Level:  [facility] PARTICIPATION LEVEL: Active  Mood:  Description of Group:   In this group patients will be encouraged to explore what they see as obstacles to their own wellness and recovery. They will be guided to discuss their thoughts, feelings, and behaviors related to these obstacles. The group will process together ways to cope with barriers, with attention given to specific choices patients can make. Each patient will be challenged to identify changes they are motivated to make in order to overcome their obstacles. This group will be process-oriented, with patients participating in exploration of their own experiences as well as giving and receiving support and challenge from other group members.  Therapeutic Goals: 1. Patient will identify personal and current obstacles as they relate to admission. 2. Patient will identify barriers that currently interfere with their wellness or overcoming obstacles.  3. Patient will identify feelings, thought process and behaviors related to these barriers. 4. Patient will identify two changes they are willing to make to overcome these obstacles:    Summary of Patient Progress   Patient was present for the entirety of the group session. Patient was an active listener and participated in the topic of discussion, provided helpful advice to others, and added nuance to topic of conversation. She stated she misses her two grandsons. She was tearful stating she wants to do more things now that she is retired and that her husband will not listen to her. She states that she is anxious about going home but does rely on her support system of her friends and daughter who help "calm her down."   Therapeutic Modalities:   Cognitive Behavioral  Therapy Solution Focused Therapy Motivational Interviewing Relapse Prevention Therapy   Rhonda [name] A [place], LCSWA

## 2022-08-03 NOTE — [facility] Group Notes (Signed)
CSW was contacted pt's clinical supervisor, Elicia Lamp, RN at [employer]. She wanted to know any updates on pt's care. CSW informed her that the provider, Dr. Louis Meckel was making medication changes and adjusting as needed, that pt would be returning home with a tentative discharge date of Thursday.   CSW asked if follow up appointment needed to be scheduled and she stated she would work with pt's husband to schedule primary care follow up.   No other requests were made. Conversation ended without incident.   Demaya Hardge [place], MSW, LCSW-A 3/11/202410:26 AM

## 2022-08-04 DIAGNOSIS — F332 Major depressive disorder, recurrent severe without psychotic features: Secondary | ICD-10-CM | POA: Diagnosis not present

## 2022-08-04 NOTE — Group Note (Signed)
Date:  08/04/2022 Time:  9:02 PM  Group Topic/Focus:  Goals Group:   The focus of this group is to help patients establish daily goals to achieve during treatment and discuss how the patient can incorporate goal setting into their daily lives to aide in recovery. Wrap-Up Group:   The focus of this group is to help patients review their daily goal of treatment and discuss progress on daily workbooks.    Participation Level:  Active  Participation Quality:  Attentive  Affect:  Excited  Cognitive:  Appropriate  Insight: Good  Engagement in Group:  Engaged  Modes of Intervention:  Discussion  Additional Comments:  Adult Unit Workbook pg.44-47  N[place] Route 08/04/2022, 9:02 PM

## 2022-08-04 NOTE — Plan of Care (Signed)
  Problem: Education: Goal: Knowledge of General Education information will improve Description: Including pain rating scale, medication(s)/side effects and non-pharmacologic comfort measures Outcome: Progressing   Problem: Health Behavior/Discharge Planning: Goal: Ability to manage health-related needs will improve Outcome: Progressing   Problem: Clinical Measurements: Goal: Ability to maintain clinical measurements within normal limits will improve Outcome: Progressing Goal: Will remain free from infection Outcome: Progressing Goal: Diagnostic test results will improve Outcome: Progressing Goal: Respiratory complications will improve Outcome: Progressing Goal: Cardiovascular complication will be avoided Outcome: Progressing   Problem: Nutrition: Goal: Adequate nutrition will be maintained Outcome: Progressing   Problem: Coping: Goal: Level of anxiety will decrease Outcome: Progressing   Problem: Elimination: Goal: Will not experience complications related to bowel motility Outcome: Progressing Goal: Will not experience complications related to urinary retention Outcome: Progressing   Problem: Pain Managment: Goal: General experience of comfort will improve Outcome: Progressing   Problem: Safety: Goal: Periods of time without injury will increase Outcome: Progressing   Problem: Physical Regulation: Goal: Ability to maintain clinical measurements within normal limits will improve Outcome: Progressing

## 2022-08-04 NOTE — Group Note (Signed)
Recreation Therapy Group Note   Group Topic:Communication  Group Date: 08/04/2022 Start Time: 1400 End Time: 1455 Facilitators: Vilma Prader, LRT, CTRS Location: Courtyard  Group Description: [employer]. LRT brought pts outside to the courtyard to get fresh air and sunlight. During the time outside, we tossed around a beach ball that has many different prompts and questions on it while listening to music. After playing, pts and LRT played blackjack with a standard deck of cards.   Affect/Mood: Appropriate, Congruent, and Happy   Participation Level: Active and Engaged   Participation Quality: Independent   Behavior: Alert, Appropriate, and Calm   Speech/Thought Process: Coherent   Insight: Good   Judgement: Good   Modes of Intervention: Activity   Patient Response to Interventions:  Attentive, Engaged, Interested , and Receptive   Education Outcome:  Acknowledges education   Clinical Observations/Individualized Feedback: Rhonda [name] was active in their participation of session activities and group discussion. Pt identified "going to the movies, going to an amusement park, going out to eat" as something that is done on a first date. Pt responded appropriately to prompted questions during group and interacted well with peers and LRT. Pt had a brighter affect in today's group than in previous interactions. Pt shared that she loved being able to go outside and get fresh air and sunlight.    Plan: Continue to engage patient in RT group sessions 2-3x/week.   Vilma Prader, LRT, CTRS 08/04/2022 3:15 PM

## 2022-08-04 NOTE — Progress Notes (Signed)
Patient is alert and oriented times 4. Mood and affect pleasant. Patient denies pain. She denies SI, HI, and AVH. Also denies feelings of depression at this time. States she slept good last night. Morning meds given whole by mouth W/O difficulty. Ate breakfast in day room- appetite good. Patient remains on unit with Q15 minute checks in place.

## 2022-08-04 NOTE — Group Note (Signed)
Date:  08/04/2022 Time:  11:09 AM  Group Topic/Focus:  Managing Feelings:   The focus of this group is to identify what feelings patients have difficulty handling and develop a plan to handle them in a healthier way upon discharge.    Participation Level:  Did Not Attend  Participation Quality:    Affect:    Cognitive:      Insight: None  Engagement in Group:  None  Modes of Intervention:  Discussion  Additional Comments:    Naiyana Barbian l Dakoda Laventure 08/04/2022, 11:09 AM

## 2022-08-04 NOTE — Group Note (Signed)
LCSW Group Therapy Note  Group Date: 08/04/2022 Start Time: 1305 End Time: 1400   Type of Therapy and Topic:  Group Therapy - How To Cope with Nervousness about Discharge   Participation Level:  Active   Description of Group This process group involved identification of patients' feelings about discharge. Some of them are scheduled to be discharged soon, while others are new admissions, but each of them was asked to share thoughts and feelings surrounding discharge from the hospital. One common theme was that they are excited at the prospect of going home, while another was that many of them are apprehensive about sharing why they were hospitalized. Patients were given the opportunity to discuss these feelings with their peers in preparation for discharge.  Therapeutic Goals  Patient will identify their overall feelings about pending discharge. Patient will think about how they might proactively address issues that they believe will once again arise once they get home (i.e. with parents). Patients will participate in discussion about having hope for change.   Summary of Patient Progress:  She was very active throughout the session. She demonstrated fair insight into the subject matter, and proved open to input from peers and feedback from [place]. She was respectful of peers and participated throughout the entire session. She said that she feels loved when her husband buys her clothes because it shows that he thinks about her. She stated that she is afraid to do her swimming classes but wants to push herself to try despite her fears. She said she would attempt to go walking with her dog when at home as a way to assist in coping skills during transition.    Therapeutic Modalities Cognitive Behavioral Therapy   Raijon Lindfors A [place], LCSWA 08/04/2022  2:21 PM

## 2022-08-04 NOTE — Progress Notes (Signed)
[facility] MD Progress Note  08/04/2022 11:45 AM Rhonda [name]  MRN:  [id number] Subjective: Rhonda [name] is seen on rounds.  She has been ambulating well and her appetite is good.  She is pleasant cooperative.  She is tolerating all of her medications.  She denies being depressed.  I informed her that her commitment is up tomorrow and she is going to see if she can get a ride home. Otherwise, she will have to sign in. Principal Problem: MDD (major depressive disorder), recurrent severe, without psychosis ([place]) Diagnosis: Principal Problem:   MDD (major depressive disorder), recurrent severe, without psychosis ([place]) Active Problems:   MDD (major depressive disorder)  Total Time spent with patient: 15 minutes  Past Psychiatric History:  Depression secondary to general medical condition.   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    Complication of anesthesia    History of low blood pressure after surgery; attributed to lying flat   Diabetes mellitus    "diet controlled" (05/03/2013)   Exertional shortness of breath    Generalized anxiety disorder    GERD (gastroesophageal reflux disease)    Gout    Heart murmur    Hyperlipidemia    Hypertension    Hypokalemia 11/08/2018   Hypothyroidism    Left bundle branch block    s/p CRT-D (04/2013)   Major depressive disorder    Migraines    On home oxygen therapy    "2L suppose to be q night" (05/03/2013)   Orthostatic hypotension 07/28/2017   Peripheral  neuropathy    Feet   SVD (spontaneous vaginal delivery)    x 2   Unspecified vitamin D deficiency 03/26/2011   Does not take meds    Past Surgical History:  Procedure Laterality Date   BI-VENTRICULAR IMPLANTABLE CARDIOVERTER DEFIBRILLATOR N/A 05/03/2013   Procedure: BI-VENTRICULAR IMPLANTABLE CARDIOVERTER DEFIBRILLATOR  (CRT-D);  Surgeon: Evans Lance, MD;  Location: [facility] CATH LAB;  Service: Cardiovascular;  Laterality: N/A;   BI-VENTRICULAR IMPLANTABLE CARDIOVERTER DEFIBRILLATOR  (CRT-D)  05/03/2013   MDT CRTD implanted by Dr Lovena Le for non ischemic cardiomyopathy   BIOPSY  05/31/2018   Procedure: BIOPSY;  Surgeon: Ronnette Juniper, MD;  Location: [facility]OS[facility];  Service: Gastroenterology;;   [place] N/A 04/11/2020   Procedure: [place];  Surgeon: Evans Lance, MD;  Location: [facility] CV LAB;  Service: Cardiovascular;  Laterality: N/A;   BREAST LUMPECTOMY Left 07/28/2010   CARDIAC CATHETERIZATION  2010   NORMAL CORONARY ARTERIES   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--11 SURGERIES     PT HAS REMOVABLE SPEECH BULB-TAKES IT OUT BEFORE HER SURGERY   COLONOSCOPY     COLONOSCOPY N/A 05/31/2018   Procedure: COLONOSCOPY;  Surgeon: Ronnette Juniper, MD;  Location: [facility] ENDOSCOPY;  Service: Gastroenterology;  Laterality: N/A;   HYSTEROSCOPY WITH D & C  01/20/2012   Procedure: DILATATION AND CURETTAGE /HYSTEROSCOPY;  Surgeon: Margarette Asal, MD;  Location: [facility];  Service: Gynecology;  Laterality: N/A;  with trueclear   PORT-A-CATH REMOVAL  09/23/2011   Procedure: REMOVAL PORT-A-CATH;  Surgeon: Rolm Bookbinder, MD;  Location: [facility] ORS;  Service: General;  Laterality: N/A;  Port Removal   PORTACATH PLACEMENT  2012   TONSILLECTOMY  1960's   TOTAL KNEE ARTHROPLASTY  05/10/2012   Procedure: TOTAL KNEE ARTHROPLASTY;  Surgeon: Mauri Pole, MD;  Location: [facility] ORS;  Service: Orthopedics;  Laterality: Right;   WISDOM TOOTH EXTRACTION     Family History:  Family History   Problem Relation Age of Onset   Alzheimer's disease Mother    CVA Mother    Hypertension Mother    Cancer - Colon Father        late 72   Birth defects Paternal Uncle    Family Psychiatric  History: Unremarkable Social History:  Social History   Substance and Sexual Activity  Alcohol Use No     Social History   Substance and Sexual Activity  Drug Use No    Social History   Socioeconomic History   Marital status: Married    Spouse name: Not on file   Number of children: 2   Years of education: 18   Highest education level: Master's degree (e.g., MA, MS, MEng, MEd, MSW, MBA)  Occupational History   Occupation: retired  Tobacco Use   Smoking status: Some Days    Packs/day: 0.10    Years: 26.00    Total pack years: 2.60    Types: Cigarettes    Last attempt to quit: 03/23/2021    Years since quitting: 1.3   Smokeless tobacco: Never  Vaping Use   Vaping Use: Never used  Substance and Sexual Activity   Alcohol use: No   Drug use: No   Sexual activity: Yes  Other Topics Concern   Not on file  Social History Narrative   Tobacco Use Cigarettes: Former Smoker, Quit in 2008   No Alcohol   No recreational drug use   Diet: Regular/Low Carb   Exercise: None   Occupation: disabled   Education: Research officer, political party, masters   Children: 2   Firearms: No   Therapist, art Use: 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      Social Determinants of Health   Financial Resource Strain: Not on file  Food Insecurity: No Food Insecurity (07/27/2022)   Hunger Vital Sign    Worried About Running Out of Food in the Last Year: Never true    Ran Out of Food in the Last Year: Never true  Transportation Needs: No Transportation Needs (07/27/2022)   PRAPARE - Hydrologist (Medical): No    Lack of Transportation (Non-Medical): No  Physical Activity: Not on file  Stress: Not on file  Social Connections: Not on file   Additional Social History:                          Sleep: Good  Appetite:  Good  Current Medications: Current Facility-Administered Medications  Medication Dose Route Frequency Provider Last Rate Last Admin   acetaminophen (TYLENOL) tablet 650 mg  650 mg Oral Q6H PRN Parks Ranger, DO   650 mg at 08/02/22 2314   alum & mag hydroxide-simeth (MAALOX/MYLANTA) 200-200-20 MG/5ML suspension 30 mL  30 mL Oral Q4H PRN Parks Ranger, DO  ascorbic acid (VITAMIN C) tablet 1,000 mg  1,000 mg Oral Daily Parks Ranger, DO   1,000 mg at 08/04/22 1003   buPROPion (WELLBUTRIN XL) 24 hr tablet 150 mg  150 mg Oral Daily Parks Ranger, DO   150 mg at 08/04/22 1010   carvedilol (COREG) tablet 3.125 mg  3.125 mg Oral BID WC Parks Ranger, DO   3.125 mg at 08/04/22 1005   citalopram (CELEXA) tablet 20 mg  20 mg Oral Daily Parks Ranger, DO   20 mg at 08/04/22 1002   clonazePAM (KLONOPIN) tablet 0.5 mg  0.5 mg Oral BID PRN Parks Ranger, DO   0.5 mg at 07/29/22 2126   colchicine tablet 0.6 mg  0.6 mg Oral BID Parks Ranger, DO   0.6 mg at 08/04/22 1003   dicyclomine (BENTYL) tablet 20 mg  20 mg Oral TID AC Parks Ranger, DO   20 mg at 08/04/22 1009   diphenhydrAMINE (BENADRYL) capsule 25 mg  25 mg Oral Once Caroline Sauger, NP       fluticasone (FLONASE) 50 MCG/ACT nasal spray 1 spray  1 spray Each Nare Daily Parks Ranger, DO   1 spray at 08/04/22 1004   gabapentin (NEURONTIN) capsule 100 mg  100 mg Oral BID Parks Ranger, DO   100 mg at 08/04/22 1001   haloperidol (HALDOL) tablet 5 mg  5 mg Oral TID PRN Vesta Mixer, NP       Or   haloperidol lactate (HALDOL) injection 5 mg  5 mg Intramuscular TID PRN Vesta Mixer, NP       levothyroxine (SYNTHROID) tablet 88 mcg  88 mcg Oral Q0600 Parks Ranger, DO   88 mcg at 08/04/22 [id number]   LORazepam (ATIVAN) tablet 1 mg  1 mg Oral Q4H PRN Parks Ranger, DO   1 mg at 08/04/22 1006   magnesium hydroxide (MILK OF MAGNESIA) suspension 30 mL  30 mL Oral Daily PRN Parks Ranger, DO       memantine [facility]) tablet 5 mg  5 mg Oral Daily Parks Ranger, DO   5 mg at 08/04/22 1006   midodrine (PROAMATINE) tablet 10 mg  10 mg Oral TID WC Parks Ranger, DO   10 mg at 08/04/22 1008   multivitamin with minerals tablet 1 tablet  1 tablet Oral Daily Parks Ranger, DO   1 tablet at 08/04/22 1005   OLANZapine (ZYPREXA) tablet 5 mg  5 mg Oral Q6H PRN Parks Ranger, DO   5 mg at 07/30/22 2111   pantoprazole (PROTONIX) EC tablet 40 mg  40 mg Oral Daily Parks Ranger, DO   40 mg at 08/04/22 1008   potassium chloride SA (KLOR-CON M) CR tablet 40 mEq  40 mEq Oral TID Parks Ranger, DO   40 mEq at 08/04/22 1009   QUEtiapine (SEROQUEL) tablet 50 mg  50 mg Oral QHS Parks Ranger, DO   50 mg at 08/03/22 2146   risperiDONE (RISPERDAL) tablet 0.5 mg  0.5 mg Oral BH-q8a4p Parks Ranger, DO   0.5 mg at 08/04/22 1005   simvastatin (ZOCOR) tablet 10 mg  10 mg Oral QPM Parks Ranger, DO   10 mg at 08/03/22 1712   spironolactone (ALDACTONE) tablet 25 mg  25 mg Oral Daily Parks Ranger, DO   25 mg at 08/04/22 1008   torsemide (DEMADEX) tablet 80 mg  80 mg Oral  BID Parks Ranger, DO   80 mg at 08/04/22 1007   traZODone (DESYREL) tablet 100 mg  100 mg Oral QHS PRN Parks Ranger, DO   100 mg at 07/31/22 2120    Lab Results: No results found. However, due to the size of the patient record, not all encounters were searched. Please check Results Review for a complete set of results.  Blood Alcohol level:  Lab Results  Component Value Date   ETH <10 07/26/2022   ETH <10 99991111    Metabolic Disorder Labs: Lab Results  Component Value Date   HGBA1C 7.0 (H) 10/09/2021   MPG 154.2 10/09/2021   MPG 151.33 10/08/2021   No results found for:  "PROLACTIN" Lab Results  Component Value Date   CHOL 133 07/31/2022   TRIG 67 07/31/2022   HDL 51 07/31/2022   CHOLHDL 2.6 07/31/2022   VLDL 13 07/31/2022   LDLCALC 69 07/31/2022   LDLCALC  08/05/2009    92        Total Cholesterol/HDL:CHD Risk Coronary Heart Disease Risk Table                     Men   Women  1/2 Average Risk   3.4   3.3  Average Risk       5.0   4.4  2 X Average Risk   9.6   7.1  3 X Average Risk  23.4   11.0        Use the calculated Patient Ratio above and the CHD Risk Table to determine the patient's CHD Risk.        ATP III CLASSIFICATION (LDL):  <100     mg/dL   Optimal  100-129  mg/dL   Near or Above                    Optimal  130-159  mg/dL   Borderline  160-189  mg/dL   High  >190     mg/dL   Very High    Physical Findings: AIMS:  , ,  ,  ,    CIWA:    COWS:     Musculoskeletal: Strength & Muscle Tone: within normal limits Gait & Station: normal Patient leans: N/A  Psychiatric Specialty Exam:  Presentation  General Appearance:  Appropriate for Environment  Eye Contact: Good  Speech: Clear and Coherent  Speech Volume: Normal  Handedness:No data recorded  Mood and Affect  Mood: Anxious  Affect: Congruent   Thought Process  Thought Processes: Coherent; Goal Directed  Descriptions of Associations:Intact  Orientation:Partial  Thought Content:WDL  History of Schizophrenia/Schizoaffective disorder:No  Duration of Psychotic Symptoms:No data recorded Hallucinations:No data recorded Ideas of Reference:None  Suicidal Thoughts:No data recorded Homicidal Thoughts:No data recorded  Sensorium  Memory: Immediate Fair; Recent Fair  Judgment: Fair  Insight: Poor   Executive Functions  Concentration: Fair  Attention Span: Fair  Recall: [place] of Knowledge: Good  Language: Good   Psychomotor Activity  Psychomotor Activity:No data recorded  Assets  Assets: Leisure Time; Physical Health;  Resilience   Sleep  Sleep:No data recorded   Physical Exam: Physical Exam Vitals and nursing note reviewed.  Constitutional:      Appearance: Normal appearance. She is normal weight.  Neurological:     General: No focal deficit present.     Mental Status: She is alert and oriented to person, place, and time.  Psychiatric:  Attention and Perception: Attention and perception normal.        Mood and Affect: Mood and affect normal.        Speech: Speech normal.        Behavior: Behavior normal. Behavior is cooperative.        Thought Content: Thought content normal.        Cognition and Memory: Cognition is impaired. Memory is impaired.        Judgment: Judgment normal.    Review of Systems  Constitutional: Negative.   HENT: Negative.    Eyes: Negative.   Respiratory: Negative.    Cardiovascular: Negative.   Gastrointestinal: Negative.   Genitourinary: Negative.   Musculoskeletal: Negative.   Skin: Negative.   Neurological: Negative.   Endo/Heme/Allergies: Negative.   Psychiatric/Behavioral: Negative.     Blood pressure (!) 121/59, pulse 88, temperature 98.9 F (37.2 C), temperature source Oral, resp. rate 18, height 5' (1.524 m), weight 83.2 kg, SpO2 100 %. Body mass index is 35.84 kg/m.   Treatment Plan Summary: Daily contact with patient to assess and evaluate symptoms and progress in treatment, Medication management, and Plan continue current medications.  [place], DO 08/04/2022, 11:45 AM

## 2022-08-04 NOTE — BH Assessment (Signed)
1905 Received patient sitting in the day room visiting with her husband. She is in a great mood. Will continue to monitor patient for safety.  2015 Patient is alert and oriented x 3, pleasant and cooperative. She is currently denying SI/HI, A/V hallucinations.   2355 Patient requested something for sleep and anxiety. Patient offered Trazodone 100 mg and Zyprexa 5 mg and stated " I don't need it."  0230 Patient noted to be awake up reading her book, she stated that she was ok and just wanted to read.  0630 Patient continue to rest quietly in bed. Will continue to to monitor patient for safety.

## 2022-08-05 DIAGNOSIS — F332 Major depressive disorder, recurrent severe without psychotic features: Secondary | ICD-10-CM | POA: Diagnosis not present

## 2022-08-05 MED ORDER — LOPERAMIDE HCL 2 MG PO CAPS
2.0000 mg | ORAL_CAPSULE | ORAL | Status: DC | PRN
  Administered 2022-08-05: 2 mg via ORAL
  Filled 2022-08-05 (×2): qty 1

## 2022-08-05 NOTE — Group Note (Signed)
Recreation Therapy Group Note   Group Topic:Coping Skills  Group Date: 08/05/2022 Start Time: 1400 End Time: 1455 Facilitators: Vilma Prader, LRT, CTRS Location: Courtyard  Group Description: Journaling. Patient was given a packet with 20 different journaling prompts on it. Pt was encouraged to pick 3-5 different prompts to complete on lined paper provided. LRT and pt discussed the benefits of journaling and how it can be implemented post discharge. After journaling, patients chose to listen to music and play cards while enjoying fresh air and sunlight.   Affect/Mood: N/A   Participation Level: Did not attend    Clinical Observations/Individualized Feedback: Leanne did not attend group due to her stomach being upset. RN aware.   Plan: Continue to engage patient in RT group sessions 2-3x/week.   Vilma Prader, LRT, CTRS 08/05/2022 3:29 PM

## 2022-08-05 NOTE — Group Note (Signed)
Date:  08/05/2022 Time:  9:26 PM  Group Topic/Focus:  Goals Group:   The focus of this group is to help patients establish daily goals to achieve during treatment and discuss how the patient can incorporate goal setting into their daily lives to aide in recovery.    Participation Level:  Active  Participation Quality:  Appropriate, Sharing, and Supportive  Affect:  Appropriate  Cognitive:  Appropriate  Insight: Good  Engagement in Group:  Supportive  Modes of Intervention:  Activity and Support  Additional Comments:    [name] 08/05/2022, 9:26 PM

## 2022-08-05 NOTE — Progress Notes (Signed)
[facility] MD Progress Note  08/05/2022 1:27 PM SHARQUITA [name]  MRN:  [id number] Subjective: Rhonda [name] is seen on rounds.  She has been very pleasant and cooperative.  She has been up and out of bed and ate breakfast and lunch interacting with peers.  She been taking her medications as prescribed and denies any side effects.  She denies any suicidal or homicidal ideation.  No auditory hallucinations.  Her husband is going to pick her up tomorrow. Principal Problem: MDD (major depressive disorder), recurrent severe, without psychosis ([place]) Diagnosis: Principal Problem:   MDD (major depressive disorder), recurrent severe, without psychosis ([place]) Active Problems:   MDD (major depressive disorder)  Total Time spent with patient: 15 minutes  Past Psychiatric History: Depression secondary to general medical conditions.  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    Complication of anesthesia    History of low blood pressure after surgery; attributed to lying flat   Diabetes mellitus    "diet controlled" (05/03/2013)   Exertional shortness of breath    Generalized anxiety disorder    GERD (gastroesophageal reflux disease)    Gout    Heart murmur    Hyperlipidemia    Hypertension    Hypokalemia 11/08/2018   Hypothyroidism    Left bundle branch block    s/p CRT-D (04/2013)   Major depressive disorder    Migraines    On home oxygen therapy    "2L suppose to be q night" (05/03/2013)   Orthostatic  hypotension 07/28/2017   Peripheral neuropathy    Feet   SVD (spontaneous vaginal delivery)    x 2   Unspecified vitamin D deficiency 03/26/2011   Does not take meds    Past Surgical History:  Procedure Laterality Date   BI-VENTRICULAR IMPLANTABLE CARDIOVERTER DEFIBRILLATOR N/A 05/03/2013   Procedure: BI-VENTRICULAR IMPLANTABLE CARDIOVERTER DEFIBRILLATOR  (CRT-D);  Surgeon: Evans Lance, MD;  [facility] CATH LAB;  Service: Cardiovascular;  Laterality: N/A;   BI-VENTRICULAR IMPLANTABLE CARDIOVERTER DEFIBRILLATOR  (CRT-D)  05/03/2013   MDT CRTD implanted by Dr Lovena Le for non ischemic cardiomyopathy   BIOPSY  05/31/2018   Procedure: BIOPSY;  Surgeon: Ronnette Juniper, MD;  Location: [facility];  Service: Gastroenterology;;   [place] N/A 04/11/2020   Procedure: [place];  Surgeon: Evans Lance, MD;  Location: [place] CV LAB;  Service: Cardiovascular;  Laterality: N/A;   BREAST LUMPECTOMY Left 07/28/2010   CARDIAC CATHETERIZATION  2010   NORMAL CORONARY ARTERIES   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--11 SURGERIES     PT HAS REMOVABLE SPEECH BULB-TAKES IT OUT BEFORE HER SURGERY   COLONOSCOPY     COLONOSCOPY N/A 05/31/2018   Procedure: COLONOSCOPY;  Surgeon: Ronnette Juniper, MD;  Location: [facility] ENDOSCOPY;  Service: Gastroenterology;  Laterality: N/A;   HYSTEROSCOPY WITH D & C  01/20/2012   Procedure: DILATATION AND CURETTAGE /HYSTEROSCOPY;  Surgeon: Margarette Asal, MD;  Location: [facility];  Service: Gynecology;  Laterality: N/A;  with trueclear   PORT-A-CATH REMOVAL  09/23/2011   Procedure: REMOVAL PORT-A-CATH;  Surgeon: Rolm Bookbinder, MD;  Location: [facility]S;  Service: General;  Laterality: N/A;  Port Removal   PORTACATH PLACEMENT  2012   TONSILLECTOMY  1960's   TOTAL KNEE ARTHROPLASTY  05/10/2012   Procedure: TOTAL KNEE ARTHROPLASTY;  Surgeon: Mauri Pole, MD;  Location: [facility];  Service: Orthopedics;  Laterality: Right;   WISDOM TOOTH EXTRACTION      Family History:  Family History  Problem Relation Age of Onset   Alzheimer's disease Mother    CVA Mother    Hypertension Mother    Cancer - Colon Father        late 57   Birth defects Paternal Uncle    Family Psychiatric  History: Unremarkable Social History:  Social History   Substance and Sexual Activity  Alcohol Use No     Social History   Substance and Sexual Activity  Drug Use No    Social History   Socioeconomic History   Marital status: Married    Spouse name: Not on file   Number of children: 2   Years of education: 18   Highest education level: Master's degree (e.g., MA, MS, MEng, MEd, MSW, MBA)  Occupational History   Occupation: retired  Tobacco Use   Smoking status: Some Days    Packs/day: 0.10    Years: 26.00    Total pack years: 2.60    Types: Cigarettes    Last attempt to quit: 03/23/2021    Years since quitting: 1.3   Smokeless tobacco: Never  Vaping Use   Vaping Use: Never used  Substance and Sexual Activity   Alcohol use: No   Drug use: No   Sexual activity: Yes  Other Topics Concern   Not on file  Social History Narrative   Tobacco Use Cigarettes: Former Smoker, Quit in 2008   No Alcohol   No recreational drug use   Diet: Regular/Low Carb   Exercise: None   Occupation: disabled   Education: Research officer, political party, masters   Children: 2   Firearms: No   Therapist, art Use: 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      Social Determinants of Health   Financial Resource Strain: Not on file  Food Insecurity: No Food Insecurity (07/27/2022)   Hunger Vital Sign    Worried About Running Out of Food in the Last Year: Never true    Ran Out of Food in the Last Year: Never true  Transportation Needs: No Transportation Needs (07/27/2022)   PRAPARE - Hydrologist (Medical): No    Lack of Transportation (Non-Medical): No  Physical Activity: Not on file  Stress: Not on file  Social Connections: Not on  file   Additional Social History:                         Sleep: Good  Appetite:  Good  Current Medications: Current Facility-Administered Medications  Medication Dose Route Frequency Provider Last Rate Last Admin   acetaminophen (TYLENOL) tablet 650 mg  650 mg Oral Q6H PRN Parks Ranger, DO   650 mg at 08/02/22 2314   alum & mag hydroxide-simeth (MAALOX/MYLANTA) 200-200-20 MG/5ML suspension 30 mL  30 mL Oral Q4H PRN Parks Ranger, DO  ascorbic acid (VITAMIN C) tablet 1,000 mg  1,000 mg Oral Daily Parks Ranger, DO   1,000 mg at 08/05/22 1039   buPROPion (WELLBUTRIN XL) 24 hr tablet 150 mg  150 mg Oral Daily Parks Ranger, DO   150 mg at 08/05/22 1038   carvedilol (COREG) tablet 3.125 mg  3.125 mg Oral BID WC Parks Ranger, DO   3.125 mg at 08/05/22 [id number]   citalopram (CELEXA) tablet 20 mg  20 mg Oral Daily Parks Ranger, DO   20 mg at 08/05/22 1038   clonazePAM (KLONOPIN) tablet 0.5 mg  0.5 mg Oral BID PRN Parks Ranger, DO   0.5 mg at 07/29/22 2126   colchicine tablet 0.6 mg  0.6 mg Oral BID Parks Ranger, DO   0.6 mg at 08/05/22 1038   dicyclomine (BENTYL) tablet 20 mg  20 mg Oral TID AC Parks Ranger, DO   20 mg at 08/05/22 1158   diphenhydrAMINE (BENADRYL) capsule 25 mg  25 mg Oral Once Caroline Sauger, NP       fluticasone (FLONASE) 50 MCG/ACT nasal spray 1 spray  1 spray Each Nare Daily Parks Ranger, DO   1 spray at 08/05/22 1038   gabapentin (NEURONTIN) capsule 100 mg  100 mg Oral BID Parks Ranger, DO   100 mg at 08/05/22 1038   haloperidol (HALDOL) tablet 5 mg  5 mg Oral TID PRN Vesta Mixer, NP       Or   haloperidol lactate (HALDOL) injection 5 mg  5 mg Intramuscular TID PRN Vesta Mixer, NP       levothyroxine (SYNTHROID) tablet 88 mcg  88 mcg Oral Q0600 Parks Ranger, DO   88 mcg at 08/05/22 0559   LORazepam (ATIVAN) tablet 1 mg   1 mg Oral Q4H PRN Parks Ranger, DO   1 mg at 08/04/22 2113   magnesium hydroxide (MILK OF MAGNESIA) suspension 30 mL  30 mL Oral Daily PRN Parks Ranger, DO       memantine [facility]) tablet 5 mg  5 mg Oral Daily Parks Ranger, DO   5 mg at 08/05/22 1038   midodrine (PROAMATINE) tablet 10 mg  10 mg Oral TID WC Parks Ranger, DO   10 mg at 08/05/22 1158   multivitamin with minerals tablet 1 tablet  1 tablet Oral Daily Parks Ranger, DO   1 tablet at 08/05/22 1039   OLANZapine (ZYPREXA) tablet 5 mg  5 mg Oral Q6H PRN Parks Ranger, DO   5 mg at 07/30/22 2111   pantoprazole (PROTONIX) EC tablet 40 mg  40 mg Oral Daily Parks Ranger, DO   40 mg at 08/05/22 1038   potassium chloride SA (KLOR-CON M) CR tablet 40 mEq  40 mEq Oral TID Parks Ranger, DO   40 mEq at 08/05/22 1038   QUEtiapine (SEROQUEL) tablet 50 mg  50 mg Oral QHS Parks Ranger, DO   50 mg at 08/04/22 2113   risperiDONE (RISPERDAL) tablet 0.5 mg  0.5 mg Oral [id number] Parks Ranger, DO   0.5 mg at 08/05/22 [id number]   simvastatin (ZOCOR) tablet 10 mg  10 mg Oral QPM Parks Ranger, DO   10 mg at 08/04/22 1815   spironolactone (ALDACTONE) tablet 25 mg  25 mg Oral Daily Parks Ranger, DO   25 mg at 08/05/22 1038   torsemide (DEMADEX) tablet 80 mg  80 mg Oral  BID Parks Ranger, DO   80 mg at 08/05/22 [id number]   traZODone (DESYREL) tablet 100 mg  100 mg Oral QHS PRN Parks Ranger, DO   100 mg at 07/31/22 2120    Lab Results: No results found. However, due to the size of the patient record, not all encounters were searched. Please check Results Review for a complete set of results.  Blood Alcohol level:  Lab Results  Component Value Date   ETH <10 07/26/2022   ETH <10 [id number]    Metabolic Disorder Labs: Lab Results  Component Value Date   HGBA1C 7.0 (H) 10/09/2021   MPG 154.2 10/09/2021   MPG 151.33  10/08/2021   No results found for: "PROLACTIN" Lab Results  Component Value Date   CHOL 133 07/31/2022   TRIG 67 07/31/2022   HDL 51 07/31/2022   CHOLHDL 2.6 07/31/2022   VLDL 13 07/31/2022   LDLCALC 69 07/31/2022   LDLCALC  08/05/2009    92        Total Cholesterol/HDL:CHD Risk Coronary Heart Disease Risk Table                     Men   Women  1/2 Average Risk   3.4   3.3  Average Risk       5.0   4.4  2 X Average Risk   9.6   7.1  3 X Average Risk  23.4   11.0        Use the calculated Patient Ratio above and the CHD Risk Table to determine the patient's CHD Risk.        ATP III CLASSIFICATION (LDL):  <100     mg/dL   Optimal  100-129  mg/dL   Near or Above                    Optimal  130-159  mg/dL   Borderline  160-189  mg/dL   High  >190     mg/dL   Very High    Physical Findings: AIMS:  , ,  ,  ,    CIWA:    COWS:     Musculoskeletal: Strength & Muscle Tone: within normal limits Gait & Station: normal Patient leans: N/A  Psychiatric Specialty Exam:  Presentation  General Appearance:  Appropriate for Environment  Eye Contact: Good  Speech: Clear and Coherent  Speech Volume: Normal  Handedness:No data recorded  Mood and Affect  Mood: Anxious  Affect: Congruent   Thought Process  Thought Processes: Coherent; Goal Directed  Descriptions of Associations:Intact  Orientation:Partial  Thought Content:WDL  History of Schizophrenia/Schizoaffective disorder:No  Duration of Psychotic Symptoms:No data recorded Hallucinations:No data recorded Ideas of Reference:None  Suicidal Thoughts:No data recorded Homicidal Thoughts:No data recorded  Sensorium  Memory: Immediate Fair; Recent Fair  Judgment: Fair  Insight: Poor   Executive Functions  Concentration: Fair  Attention Span: Fair  Recall: [place] of Knowledge: Good  Language: Good   Psychomotor Activity  Psychomotor Activity:No data recorded  Assets   Assets: Leisure Time; Physical Health; Resilience   Sleep  Sleep:No data recorded   Physical Exam: Physical Exam Vitals and nursing note reviewed.  Constitutional:      Appearance: Normal appearance. She is normal weight.  Neurological:     General: No focal deficit present.     Mental Status: She is alert and oriented to person, place, and time.  Psychiatric:  Attention and Perception: Attention and perception normal.        Mood and Affect: Mood and affect normal.        Speech: Speech normal.        Behavior: Behavior normal. Behavior is cooperative.        Thought Content: Thought content normal.        Cognition and Memory: Memory normal. Cognition is impaired.        Judgment: Judgment normal.    Review of Systems  Constitutional: Negative.   HENT: Negative.    Eyes: Negative.   Respiratory: Negative.    Cardiovascular: Negative.   Gastrointestinal: Negative.   Genitourinary: Negative.   Musculoskeletal: Negative.   Skin: Negative.   Neurological: Negative.   Endo/Heme/Allergies: Negative.   Psychiatric/Behavioral:  Positive for memory loss.    Blood pressure (!) 113/58, pulse 94, temperature 98.6 F (37 C), temperature source Oral, resp. rate 16, height 5' (1.524 m), weight 83.2 kg, SpO2 94 %. Body mass index is 35.82 kg/m.   Treatment Plan Summary: Daily contact with patient to assess and evaluate symptoms and progress in treatment, Medication management, and Plan continue current medications.  Discharge tomorrow.  [place], DO 08/05/2022, 1:27 PM

## 2022-08-05 NOTE — Plan of Care (Signed)
  Problem: Clinical Measurements: Goal: Cardiovascular complication will be avoided Outcome: Progressing   Problem: Elimination: Goal: Will not experience complications related to bowel motility Outcome: Progressing   Problem: Safety: Goal: Ability to remain free from injury will improve Outcome: Progressing   

## 2022-08-05 NOTE — Progress Notes (Signed)
Patient is A+O x 3. She denies anxiety and depression. She denies SI/HI/AVH. Appetite good. Pain 0/10. Medication compliant.  Loperamide '2mg'$  adm for diarrhea at 1507. Patient complained of having multiple loose stools today. Upon follow up, no other loose stool reported.  Patient is scheduled to discharge tomorrow.  Q15 minute unit checks in place.

## 2022-08-05 NOTE — Progress Notes (Signed)
   08/04/22 2200  Psych Admission Type (Psych Patients Only)  Admission Status Voluntary  Psychosocial Assessment  Patient Complaints None  Eye Contact Fair  Facial Expression Other (Comment) (WNL)  Affect Appropriate to circumstance  Speech Logical/coherent  Interaction Assertive  Motor Activity Slow  Appearance/Hygiene Unremarkable  Behavior Characteristics Cooperative  Mood Pleasant  Thought Process  Coherency WDL  Content WDL  Delusions None reported or observed  Perception WDL  Hallucination None reported or observed  Judgment Impaired  Confusion Mild  Danger to Self  Current suicidal ideation? Denies  Danger to Others  Danger to Others None reported or observed

## 2022-08-05 NOTE — Plan of Care (Signed)
  Problem: Education: Goal: Knowledge of General Education information will improve Description: Including pain rating scale, medication(s)/side effects and non-pharmacologic comfort measures Outcome: Progressing   Problem: Health Behavior/Discharge Planning: Goal: Ability to manage health-related needs will improve Outcome: Progressing   Problem: Coping: Goal: Level of anxiety will decrease Outcome: Progressing   Problem: Safety: Goal: Ability to remain free from injury will improve Outcome: Progressing   Problem: Education: Goal: Emotional status will improve Outcome: Progressing Goal: Mental status will improve Outcome: Progressing   Problem: Coping: Goal: Ability to verbalize frustrations and anger appropriately will improve Outcome: Progressing

## 2022-08-06 ENCOUNTER — Other Ambulatory Visit (HOSPITAL_COMMUNITY): Payer: Self-pay

## 2022-08-06 DIAGNOSIS — F332 Major depressive disorder, recurrent severe without psychotic features: Secondary | ICD-10-CM | POA: Diagnosis not present

## 2022-08-06 MED ORDER — MEMANTINE HCL 5 MG PO TABS: 5.0000 mg | ORAL_TABLET | Freq: Every day | ORAL | 3 refills | Status: DC

## 2022-08-06 MED ORDER — TRAZODONE HCL 100 MG PO TABS: 100.0000 mg | ORAL_TABLET | Freq: Every evening | ORAL | 3 refills | Status: DC | PRN

## 2022-08-06 MED ORDER — QUETIAPINE FUMARATE 50 MG PO TABS: 50.0000 mg | ORAL_TABLET | Freq: Every day | ORAL | 3 refills | Status: DC

## 2022-08-06 MED ORDER — RISPERIDONE 0.5 MG PO TABS: 0.5000 mg | ORAL_TABLET | ORAL | 3 refills | Status: DC

## 2022-08-06 NOTE — Group Note (Signed)
[facility] LCSW Group Therapy Note   Group Date: 08/06/2022 Start Time: [id number] End Time: 1405   Type of Therapy/Topic:  Group Therapy:  Balance in Life  Participation Level:  Did Not Attend   Description of Group:    This group will address the concept of balance and how it feels and looks when one is unbalanced. Patients will be encouraged to process areas in their lives that are out of balance, and identify reasons for remaining unbalanced. Facilitators will guide patients utilizing problem- solving interventions to address and correct the stressor making their life unbalanced. Understanding and applying boundaries will be explored and addressed for obtaining  and maintaining a balanced life. Patients will be encouraged to explore ways to assertively make their unbalanced needs known to significant others in their lives, using other group members and facilitator for support and feedback.  Therapeutic Goals: Patient will identify two or more emotions or situations they have that consume much of in their lives. Patient will identify signs/triggers that life has become out of balance:  Patient will identify two ways to set boundaries in order to achieve balance in their lives:  Patient will demonstrate ability to communicate their needs through discussion and/or role plays  Summary of Patient Progress:    X    Therapeutic Modalities:   Cognitive Behavioral Therapy Solution-Focused Therapy Assertiveness Training   [place], LCSWA

## 2022-08-06 NOTE — BHH Suicide Risk Assessment (Signed)
[facility] Discharge Suicide Risk Assessment   Principal Problem: MDD (major depressive disorder), recurrent severe, without psychosis ([place]) Discharge Diagnoses: Principal Problem:   MDD (major depressive disorder), recurrent severe, without psychosis ([place]) Active Problems:   MDD (major depressive disorder)   Total Time spent with patient: 1 hour  Musculoskeletal: Strength & Muscle Tone: within normal limits Gait & Station: normal Patient leans: N/A  Psychiatric Specialty Exam  Presentation  General Appearance:  Appropriate for Environment  Eye Contact: Good  Speech: Clear and Coherent  Speech Volume: Normal  Handedness:No data recorded  Mood and Affect  Mood: Anxious  Duration of Depression Symptoms: Greater than two weeks  Affect: Congruent   Thought Process  Thought Processes: Coherent; Goal Directed  Descriptions of Associations:Intact  Orientation:Partial  Thought Content:WDL  History of Schizophrenia/Schizoaffective disorder:No  Duration of Psychotic Symptoms:No data recorded Hallucinations:No data recorded Ideas of Reference:None  Suicidal Thoughts:No data recorded Homicidal Thoughts:No data recorded  Sensorium  Memory: Immediate Fair; Recent Fair  Judgment: Fair  Insight: Poor   Executive Functions  Concentration: Fair  Attention Span: Fair  Recall: [place] of Knowledge: Good  Language: Good   Psychomotor Activity  Psychomotor Activity:No data recorded  Assets  Assets: Leisure Time; Physical Health; Resilience   Sleep  Sleep:No data recorded  Physical Exam: Physical Exam ROS Blood pressure 120/82, pulse (!) 122, temperature 100.2 F (37.9 C), temperature source Oral, resp. rate 16, height 5' (1.524 m), weight 83 kg, SpO2 96 %. Body mass index is 35.74 kg/m.  Mental Status Per Nursing Assessment::   On Admission:  NA  Demographic Factors:  Age 19 or older  Loss Factors: NA  Historical  Factors: NA  Risk Reduction Factors:   NA  Continued Clinical Symptoms:  Depression:   Anhedonia  Cognitive Features That Contribute To Risk:  Loss of executive function    Suicide Risk:  Minimal: No identifiable suicidal ideation.  Patients presenting with no risk factors but with morbid ruminations; may be classified as minimal risk based on the severity of the depressive symptoms   Follow-up [facility] Follow up on 09/17/2022.   Specialty: Behavioral Health Why: You have follow up appointment scheduled with Dr. Adele Schilder on Thursday April 25th at 2:15pm. Please contact their office if you need to reschedule. Thanks! Contact information: [address] [place] [phone]                Plan Of Care/Follow-up recommendations:  [place], DO 08/06/2022, 10:47 AM

## 2022-08-06 NOTE — Care Management Important Message (Signed)
Important Message  Patient Details  Name: Rhonda [name] MRN: [id number] Date of Birth: 11-Aug-1950   Medicare Important Message Given:  Yes     Hiromi Knodel A [place], Latanya Presser 08/06/2022, 9:34 AM

## 2022-08-06 NOTE — Progress Notes (Signed)
  [facility] Adult Case Management Discharge Plan :  Will you be returning to the same living situation after discharge:  Yes,  pt will be returning home At discharge, do you have transportation home?: Yes,  pt's husband will be providing transportation Do you have the ability to pay for your medications: Yes,  pt has [facility]  Release of information consent forms completed and in the chart;  Patient's signature needed at discharge.  Patient to Follow up at:  Follow-up Information     [facility] Follow up on 09/17/2022.   Specialty: Behavioral Health Why: You have follow up appointment scheduled with Dr. Adele Schilder on Thursday April 25th at 2:15pm. Please contact their office if you need to reschedule. Thanks! Contact information: [place] [phone]                Next level of care provider has access to [place] and Suicide Prevention discussed: Yes,  pt SPE completed with pt's husband     Has patient been referred to the Quitline?: Yes, faxed on 08/06/22  Patient has been referred for addiction treatment: N/A  Rhonda [name], [place] 08/06/2022, 9:21 AM

## 2022-08-06 NOTE — Progress Notes (Signed)
Paramedicine Encounter    Patient ID: Rhonda [name], female    DOB: 03/12/1951, 72 y.o.   MRN: [id number]   Martin Majestic out for a med rec for Rhonda [name] as she arrived home today from [facility]. She is ambulating with a smile on her face when I see her today. She reports feeling well and doing much better and glad to be home. Her husband Rhonda [name] and daughter Rhonda [name] are in the home with her today. Rhonda [name] obtained all new medications from [employer]- I reviewed notes from discharge and filled pill box to get her through until Monday when I can come out for full home visit. She denied any complaints today and was grateful for same. Rhonda [name] and Rhonda [name] and I discussed that they were going to be applying for medicaid and will be trying to get more help in the home for [facility] while Rhonda [name] is at work. His new hours at [employer] are now 1230 to 2100. Rhonda [name] is very happy to have him home sooner at night. She had no concerns at present and agreed with follow up on Monday. Visit complete.   [name], EMT-Paramedic [phone] 08/06/2022     Patient Care Team: [name], MD as PCP - General (Family Medicine) [name], LCSW as Social Worker (Licensed Clinical Social Worker) [name], [name], DO as Consulting Physician (Neurology) [name], Paramedic as Paramedic [name], [name], MD as Attending Physician (Family Medicine) Barbaraann Faster, RN as [place] [facility]  Patient Active Problem List   Diagnosis Date Noted   Major depressive disorder, recurrent episode, severe ([place]) 07/27/2022   MDD (major depressive disorder), recurrent severe, without psychosis ([place]) 07/27/2022   MDD (major depressive disorder) 07/27/2022   Elevated troponin 12/02/2021   Nausea & vomiting 12/01/2021   Acute left ankle pain 10/11/2021   Intractable nausea and vomiting 10/08/2021   AKI (acute kidney injury) ([place]) 10/08/2021   Hyponatremia 10/08/2021   Decreased estrogen level  08/01/2021   Dementia ([place]) 08/01/2021   Frail elderly 08/01/2021   Hyperparathyroidism ([place]) 08/01/2021   Spondylolisthesis 08/01/2021   Varicose veins of bilateral lower extremities with other complications [id number]   Lumbago without sciatica 04/30/2021   Abnormal gait 06/11/2020   Adult failure to thrive syndrome 06/11/2020   Allergic rhinitis 06/11/2020   Anxiety 06/11/2020   Asthma 06/11/2020   Benign intracranial hypertension 06/11/2020   Body mass index (BMI) 45.0-49.9, adult ([place]) 06/11/2020   Bowel incontinence 06/11/2020   Cholelithiasis 06/11/2020   Chronic sinusitis 06/11/2020   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   Daytime somnolence 06/11/2020   Diarrhea of presumed infectious origin 06/11/2020   Edema 06/11/2020   Family history of malignant neoplasm of gastrointestinal tract 06/11/2020   Gout 06/11/2020   History of fall 06/11/2020   History of infectious disease 06/11/2020   Insomnia 06/11/2020   Irregular bowel habits 06/11/2020   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   Lumbar spondylosis with myelopathy 06/11/2020   Macrocytosis 06/11/2020   Mild recurrent major depression ([place]) 06/11/2020   Personal history of colonic polyps 06/11/2020   Personal history of malignant neoplasm of breast 06/11/2020   Repeated falls 06/11/2020   Mixed anxiety and depressive disorder 06/11/2020   Irritable bowel syndrome 01/04/2020   Spinal stenosis of lumbar region 01/03/2020   Other symptoms and signs involving the musculoskeletal system 09/11/2019   Bilateral leg weakness 08/01/2019   Leg swelling 08/01/2019   Diabetes mellitus with neuropathy ([place]) 06/15/2019   Hypokalemia 11/08/2018  CKD (chronic kidney disease) stage 3, GFR 30-59 ml/min (HCC) 11/08/2018   Orthostatic hypotension 07/28/2017   SVD (spontaneous vaginal delivery)    Peripheral neuropathy    On home oxygen therapy    Migraines    Left bundle branch block    Hypothyroidism    Hypertension    Hyperlipidemia    Heart  murmur    GERD (gastroesophageal reflux disease)    Exertional shortness of breath    Major depressive disorder    Back pain    Arthritis    Generalized anxiety disorder    Anemia    Chronic respiratory failure ([place]) 09/14/2013   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/04/2013   Morbid obesity ([place]) 0000000   Chronic systolic heart failure ([place]) 10/27/2012   Endometrial polyp 01/20/2012   Malignant tumor of breast ([place]) 03/26/2011   Unspecified vitamin D deficiency 03/26/2011    Current Outpatient Medications:    acetaZOLAMIDE (DIAMOX) 125 MG tablet, Take 1 tablet (125 mg total) by mouth daily., Disp: 90 tablet, Rfl: 3   Ascorbic Acid (VITAMIN C) 1000 MG tablet, Take 1,000 mg by mouth daily., Disp: , Rfl:    buPROPion (WELLBUTRIN XL) 150 MG 24 hr tablet, Take 150 mg by mouth daily., Disp: , Rfl:    carvedilol (COREG) 3.125 MG tablet, TAKE 1 TABLET (3.125 MG) BY MOUTH TWICE DAILY WITH MEALS (Patient taking differently: Take 3.125 mg by mouth 2 (two) times daily with a meal. TAKE 1 TABLET (3.125 MG) BY MOUTH TWICE DAILY WITH MEALS), Disp: 180 tablet, Rfl: 3   citalopram (CELEXA) 20 MG tablet, Take 20 mg by mouth daily., Disp: , Rfl:    clonazePAM (KLONOPIN) 0.5 MG tablet, Take 0.5 mg by mouth 2 (two) times daily as needed for anxiety., Disp: , Rfl:    cyanocobalamin (VITAMIN B12) 1000 MCG tablet, Take 1,000 mcg by mouth daily., Disp: , Rfl:    dicyclomine (BENTYL) 20 MG tablet, Take 20 mg by mouth 3 (three) times daily before meals., Disp: , Rfl:    fluticasone (FLONASE) 50 MCG/ACT nasal spray, Place 1 spray into both nostrils daily., Disp: , Rfl:    gabapentin (NEURONTIN) 100 MG capsule, Take 1 capsule (100 mg total) by mouth 2 (two) times daily., Disp: 180 capsule, Rfl: 3   levothyroxine (SYNTHROID) 88 MCG tablet, TAKE 1 TABLET(88 MCG) BY MOUTH DAILY BEFORE BREAKFAST (Patient taking differently: Take 88 mcg by mouth daily before breakfast. TAKE 1  TABLET(88 MCG) BY MOUTH DAILY BEFORE BREAKFAST), Disp: 90 tablet, Rfl: 3   [START ON 08/07/2022] memantine (NAMENDA) 5 MG tablet, Take 1 tablet (5 mg total) by mouth daily., Disp: 30 tablet, Rfl: 3   metolazone (ZAROXOLYN) 2.5 MG tablet, TAKE 1 TABLET BY MOUTH EVERY OTHER TUESDAY. (Patient taking differently: Take 2.5 mg by mouth. Take 1 tablet by mouth every other Tuesday.), Disp: 12 tablet, Rfl: 3   midodrine (PROAMATINE) 10 MG tablet, TAKE 1 TABLET THREE TIMES DAILY WITH MEALS (Patient taking differently: Take 10 mg by mouth 3 (three) times daily. TAKE 1 TABLET THREE TIMES DAILY WITH MEALS), Disp: 270 tablet, Rfl: 3   Multiple Vitamin (MULTIVITAMIN WITH MINERALS) TABS tablet, Take 1 tablet by mouth daily., Disp: , Rfl:    nystatin (MYCOSTATIN) 100000 UNIT/ML suspension, Take 5 mLs by mouth 4 (four) times daily., Disp: , Rfl:    ondansetron (ZOFRAN) 4 MG tablet, Take 1 tablet (4 mg total) by mouth every 6 (six) hours. (Patient not taking: Reported on 07/20/2022), Disp:  15 tablet, Rfl: 0   pantoprazole (PROTONIX) 40 MG tablet, Take 40 mg by mouth daily as needed (for acid reflux)., Disp: , Rfl:    potassium chloride SA (KLOR-CON M) 20 MEQ tablet, TAKE 2 TABLETS THREE TIMES DAILY. TAKE AN EXTRA 2 TABLETS ON DAYS YOU TAKE METOLAZONE (Patient taking differently: Take 40 mEq by mouth 3 (three) times daily.), Disp: 572 tablet, Rfl: 3   QUEtiapine (SEROQUEL) 50 MG tablet, Take 1 tablet (50 mg total) by mouth at bedtime., Disp: 30 tablet, Rfl: 3   risperiDONE (RISPERDAL) 0.5 MG tablet, Take 1 tablet (0.5 mg total) by mouth 2 (two) times daily at 8 am and 4 pm., Disp: 30 tablet, Rfl: 3   simvastatin (ZOCOR) 10 MG tablet, TAKE 1 TABLET EVERY EVENING (Patient taking differently: Take 10 mg by mouth daily at 6 PM.), Disp: 90 tablet, Rfl: 1   spironolactone (ALDACTONE) 25 MG tablet, TAKE 1 TABLET (25 MG TOTAL) BY MOUTH EVERY EVENING. (Patient taking differently: Take 25 mg by mouth daily.), Disp: 90 tablet, Rfl: 0    tiZANidine (ZANAFLEX) 2 MG tablet, Take 1 tablet (2 mg total) by mouth at bedtime as needed for muscle spasms., Disp: 90 tablet, Rfl: 1   torsemide (DEMADEX) 20 MG tablet, Take 4 tablets (80 mg total) by mouth 2 (two) times daily., Disp: 240 tablet, Rfl: 6   traZODone (DESYREL) 100 MG tablet, Take 1 tablet (100 mg total) by mouth at bedtime as needed for sleep., Disp: 30 tablet, Rfl: 3 No current facility-administered medications for this visit.  Facility-Administered Medications Ordered in Other Visits:    acetaminophen (TYLENOL) tablet 650 mg, 650 mg, Oral, Q6H PRN, Parks Ranger, DO, 650 mg at 08/02/22 2314   alum & mag hydroxide-simeth (MAALOX/MYLANTA) 200-200-20 MG/5ML suspension 30 mL, 30 mL, Oral, Q4H PRN, Parks Ranger, DO   ascorbic acid (VITAMIN C) tablet 1,000 mg, 1,000 mg, Oral, Daily, Parks Ranger, DO, 1,000 mg at 08/06/22 0935   buPROPion (WELLBUTRIN XL) 24 hr tablet 150 mg, 150 mg, Oral, Daily, Parks Ranger, DO, 150 mg at 08/06/22 0935   carvedilol (COREG) tablet 3.125 mg, 3.125 mg, Oral, BID WC, Parks Ranger, DO, 3.125 mg at 08/06/22 [id number]   citalopram (CELEXA) tablet 20 mg, 20 mg, Oral, Daily, Parks Ranger, DO, 20 mg at 08/06/22 0935   clonazePAM (KLONOPIN) tablet 0.5 mg, 0.5 mg, Oral, BID PRN, Parks Ranger, DO, 0.5 mg at 07/29/22 2126   colchicine tablet 0.6 mg, 0.6 mg, Oral, BID, Parks Ranger, DO, 0.6 mg at 08/06/22 0935   dicyclomine (BENTYL) tablet 20 mg, 20 mg, Oral, TID AC, Parks Ranger, DO, 20 mg at 08/06/22 1155   diphenhydrAMINE (BENADRYL) capsule 25 mg, 25 mg, Oral, Once, Caroline Sauger, NP   fluticasone (FLONASE) 50 MCG/ACT nasal spray 1 spray, 1 spray, Each Nare, Daily, Parks Ranger, DO, 1 spray at 08/06/22 0936   gabapentin (NEURONTIN) capsule 100 mg, 100 mg, Oral, BID, Parks Ranger, DO, 100 mg at 08/06/22 0935   haloperidol (HALDOL) tablet 5 mg,  5 mg, Oral, TID PRN **OR** haloperidol lactate (HALDOL) injection 5 mg, 5 mg, Intramuscular, TID PRN, Vesta Mixer, NP   levothyroxine (SYNTHROID) tablet 88 mcg, 88 mcg, Oral, Q0600, Parks Ranger, DO, 88 mcg at 08/06/22 0558   loperamide (IMODIUM) capsule 2 mg, 2 mg, Oral, PRN, Parks Ranger, DO, 2 mg at 08/05/22 1507   LORazepam (ATIVAN) tablet 1 mg, 1 mg, Oral, Q4H  PRN, Parks Ranger, DO, 1 mg at 08/04/22 2113   magnesium hydroxide (MILK OF MAGNESIA) suspension 30 mL, 30 mL, Oral, Daily PRN, Parks Ranger, DO   memantine [facility]) tablet 5 mg, 5 mg, Oral, Daily, Parks Ranger, DO, 5 mg at 08/06/22 0935   midodrine (PROAMATINE) tablet 10 mg, 10 mg, Oral, TID WC, Parks Ranger, DO, 10 mg at 08/06/22 1155   multivitamin with minerals tablet 1 tablet, 1 tablet, Oral, Daily, Parks Ranger, DO, 1 tablet at 08/06/22 0935   OLANZapine (ZYPREXA) tablet 5 mg, 5 mg, Oral, Q6H PRN, Parks Ranger, DO, 5 mg at 07/30/22 2111   pantoprazole (PROTONIX) EC tablet 40 mg, 40 mg, Oral, Daily, Parks Ranger, DO, 40 mg at 08/06/22 0935   potassium chloride SA (KLOR-CON M) CR tablet 40 mEq, 40 mEq, Oral, TID, Parks Ranger, DO, 40 mEq at 08/06/22 0935   QUEtiapine (SEROQUEL) tablet 50 mg, 50 mg, Oral, QHS, Parks Ranger, DO, 50 mg at 08/05/22 2109   risperiDONE (RISPERDAL) tablet 0.5 mg, 0.5 mg, Oral, [id number], Parks Ranger, DO, 0.5 mg at 08/06/22 [id number]   simvastatin (ZOCOR) tablet 10 mg, 10 mg, Oral, QPM, Parks Ranger, DO, 10 mg at 08/05/22 1724   spironolactone (ALDACTONE) tablet 25 mg, 25 mg, Oral, Daily, Parks Ranger, DO, 25 mg at 08/06/22 0935   torsemide (DEMADEX) tablet 80 mg, 80 mg, Oral, BID, Parks Ranger, DO, 80 mg at 08/06/22 [id number]   traZODone (DESYREL) tablet 100 mg, 100 mg, Oral, QHS PRN, Parks Ranger, DO, 100 mg at 07/31/22 2120 Allergies   Allergen Reactions   Ceftin Anaphylaxis    Face and throat swell    Cefuroxime Axetil Anaphylaxis    Face and throat swell   Geodon [Ziprasidone Hcl] Hives   Lisinopril Other (See Comments)    angioedema   Cefuroxime     Other reaction(s): anaphylaxis   Sulfacetamide Sodium-Sulfur     Other reaction(s): itch   Allopurinol Nausea Only and Other (See Comments)    weakness   Ativan [Lorazepam] Itching   Sulfa Antibiotics Itching   Valium [Diazepam] Other (See Comments)    Patient states that diazepam doesn't relax, it has the opposite effect.     Social History   Socioeconomic History   Marital status: Married    Spouse name: Not on file   Number of children: 2   Years of education: 80   Highest education level: Master's degree (e.g., MA, MS, MEng, MEd, MSW, MBA)  Occupational History   Occupation: retired  Tobacco Use   Smoking status: Some Days    Packs/day: 0.10    Years: 26.00    Additional pack years: 0.00    Total pack years: 2.60    Types: Cigarettes    Last attempt to quit: 03/23/2021    Years since quitting: 1.3   Smokeless tobacco: Never  Vaping Use   Vaping Use: Never used  Substance and Sexual Activity   Alcohol use: No   Drug use: No   Sexual activity: Yes  Other Topics Concern   Not on file  Social History Narrative   Tobacco Use Cigarettes: Former Smoker, Quit in 2008   No Alcohol   No recreational drug use   Diet: Regular/Low Carb   Exercise: None   Occupation: disabled   Education: Research officer, political party, masters   Children: 2   Firearms: No   Therapist, art Use: Always   Former  Metallurgist.    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      Social Determinants of Health   Financial Resource Strain: Not on file  Food Insecurity: No Food Insecurity (07/27/2022)   Hunger Vital Sign    Worried About Running Out of Food in the Last Year: Never true    Ran Out of Food in the Last Year: Never true  Transportation Needs: No Transportation Needs (07/27/2022)   PRAPARE  - Hydrologist (Medical): No    Lack of Transportation (Non-Medical): No  Physical Activity: Not on file  Stress: Not on file  Social Connections: Not on file  Intimate Partner Violence: Not At Risk (07/27/2022)   Humiliation, Afraid, Rape, and Kick questionnaire    Fear of Current or Ex-Partner: No    Emotionally Abused: No    Physically Abused: No    Sexually Abused: No    Physical Exam      Future Appointments  Date Time Provider Red Sandner  08/10/2022  7:35 AM [facility] DEVICE REMOTES [facility]  08/11/2022  9:30 AM Barbaraann Faster, RN [facility] None  08/20/2022 10:00 AM Kandis Mannan, RN [facility] None  09/09/2022  3:15 PM Marzetta Board, DPM [facility]  09/14/2022  7:10 AM [facility] DEVICE REMOTES [facility]  09/17/2022  2:40 PM Arfeen, Arlyce Harman, MD [facility] None  12/14/2022  7:10 AM [facility] DEVICE REMOTES [facility]  03/15/2023  7:10 AM [facility] DEVICE REMOTES [facility]  06/02/2023  1:00 PM Hazle Coca, PhD [facility] None  06/14/2023  7:10 AM [facility] DEVICE REMOTES [facility]  06/29/2023  1:30 PM Narda Amber K, DO [facility] None  09/13/2023  7:10 AM [facility] DEVICE REMOTES [facility]     ACTION: Home visit completed

## 2022-08-06 NOTE — Plan of Care (Signed)
Pt denies anxiety/depression at this time. Pt denies SI/HI/AVH or pain at this time. Pt is calm and cooperative. Pt is medication compliant. Pt provided with support and encouragement. Pt monitored q15 minutes for safety per unit policy. Plan of care ongoing.   Problem: Activity: Goal: Risk for activity intolerance will decrease Outcome: Progressing   Problem: Nutrition: Goal: Adequate nutrition will be maintained Outcome: Progressing   
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
## 2022-08-06 NOTE — Progress Notes (Signed)
Pt was in the dayroom at the start of shift visiting with her husband. Pt is alert and oriented x4, denied anxiety and depression, SI/HI. She is excited to be going home in the morning and was asking when her husband would come get her. She was compliant with her scheduled medications and was in her room reading. Pt has appeared to be sleeping with no distress noted.

## 2022-08-06 NOTE — Progress Notes (Addendum)
D: Pt alert and oriented. Pt denies experiencing any pain, SI/HI, or AVH at this time. Pt reports she will be able to keep herself safe when she returns home. Pt has completed a suicide safety plan and was given a survey to fill out.   Transition record was reviewed and given to the patient. Pt does not request a follow up phone call from the manager.   A: Pt received discharge and medication education/information. Pt belongings were returned and signed for at this time.   R: Pt verbalized understanding of discharge and medication education/information.  Pt escorted by staff via wheelchair to medical mall front lobby where pt's spouse picked her up.

## 2022-08-10 ENCOUNTER — Other Ambulatory Visit (HOSPITAL_COMMUNITY): Payer: Self-pay

## 2022-08-10 ENCOUNTER — Ambulatory Visit: Payer: Medicare HMO | Attending: Internal Medicine

## 2022-08-10 DIAGNOSIS — I5022 Chronic systolic (congestive) heart failure: Secondary | ICD-10-CM

## 2022-08-10 DIAGNOSIS — Z95810 Presence of automatic (implantable) cardiac defibrillator: Secondary | ICD-10-CM | POA: Diagnosis not present

## 2022-08-10 NOTE — Progress Notes (Signed)
Paramedicine Encounter    Patient ID: Rhonda [name], female    DOB: 12-20-1950, 72 y.o.   MRN: [id number]  Arrived for home visit for Rhonda [name] who reports to be feeling well. She denied any chest pain, shortness of breath, dizziness or swelling. Lower legs had no edema noted. Lungs clear. Weight is 185lbs today. She has had all medications since our last visit with no missed doses. I reviewed vitals and assessment. Also reviewed medications filled pill box for one week. Refills needed from [facility]. I will call same in. I reviewed upcoming appointments and listed them on her wall calendar and her notebook calendar I provided today. She denied any trouble with her depression or anxiety today reporting she is feeling pretty good other than just tired. She feels her medications are making her more sleepy. She sees her PCP Thursday and her and her husband plan to discuss same with provider. She has no other needs at this time. Home visit complete.   [name], EMT-Paramedic [phone] 08/10/2022         Patient Care Team: [name], MD as PCP - General (Family Medicine) [name], LCSW as Social Worker (Licensed Clinical Social Worker) [name], [name], DO as Consulting Physician (Neurology) [name], Paramedic as Paramedic [name], [name], MD as Attending Physician (Family Medicine) Barbaraann Faster, RN as [place] Management  Patient Active Problem List   Diagnosis Date Noted   Major depressive disorder, recurrent episode, severe ([place]) 07/27/2022   MDD (major depressive disorder), recurrent severe, without psychosis ([place]) 07/27/2022   MDD (major depressive disorder) 07/27/2022   Elevated troponin 12/02/2021   Nausea & vomiting 12/01/2021   Acute left ankle pain 10/11/2021   Intractable nausea and vomiting 10/08/2021   AKI (acute kidney injury) ([place]) 10/08/2021   Hyponatremia 10/08/2021   Decreased estrogen level  08/01/2021   Dementia ([place]) 08/01/2021   Frail elderly 08/01/2021   Hyperparathyroidism ([place]) 08/01/2021   Spondylolisthesis 08/01/2021   Varicose veins of bilateral lower extremities with other complications [id number]   Lumbago without sciatica 04/30/2021   Abnormal gait 06/11/2020   Adult failure to thrive syndrome 06/11/2020   Allergic rhinitis 06/11/2020   Anxiety 06/11/2020   Asthma 06/11/2020   Benign intracranial hypertension 06/11/2020   Body mass index (BMI) 45.0-49.9, adult ([place]) 06/11/2020   Bowel incontinence 06/11/2020   Cholelithiasis 06/11/2020   Chronic sinusitis 06/11/2020   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   Daytime somnolence 06/11/2020   Diarrhea of presumed infectious origin 06/11/2020   Edema 06/11/2020   Family history of malignant neoplasm of gastrointestinal tract 06/11/2020   Gout 06/11/2020   History of fall 06/11/2020   History of infectious disease 06/11/2020   Insomnia 06/11/2020   Irregular bowel habits 06/11/2020   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   Lumbar spondylosis with myelopathy 06/11/2020   Macrocytosis 06/11/2020   Mild recurrent major depression ([place]) 06/11/2020   Personal history of colonic polyps 06/11/2020   Personal history of malignant neoplasm of breast 06/11/2020   Repeated falls 06/11/2020   Mixed anxiety and depressive disorder 06/11/2020   Irritable bowel syndrome 01/04/2020   Spinal stenosis of lumbar region 01/03/2020   Other symptoms and signs involving the musculoskeletal system 09/11/2019   Bilateral leg weakness 08/01/2019   Leg swelling 08/01/2019   Diabetes mellitus with neuropathy ([place]) 06/15/2019   Hypokalemia 11/08/2018   CKD (chronic kidney disease) stage 3, GFR 30-59 ml/min (HCC) 11/08/2018   Orthostatic hypotension 07/28/2017   SVD (  spontaneous vaginal delivery)    Peripheral neuropathy    On home oxygen therapy    Migraines    Left bundle branch block    Hypothyroidism    Hypertension    Hyperlipidemia    Heart  murmur    GERD (gastroesophageal reflux disease)    Exertional shortness of breath    Major depressive disorder    Back pain    Arthritis    Generalized anxiety disorder    Anemia    Chronic respiratory failure ([place]) 09/14/2013   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 (Patient not taking: Reported on 08/06/2022), Disp: , Rfl:    buPROPion (WELLBUTRIN XL) 150 MG 24 hr tablet, Take 150 mg by mouth daily., Disp: , Rfl:    carvedilol (COREG) 3.125 MG tablet, TAKE 1 TABLET (3.125 MG) BY MOUTH TWICE DAILY WITH MEALS (Patient taking differently: Take 3.125 mg by mouth 2 (two) times daily with a meal. TAKE 1 TABLET (3.125 MG) BY MOUTH TWICE DAILY WITH MEALS), Disp: 180 tablet, Rfl: 3   citalopram (CELEXA) 20 MG tablet, Take 20 mg by mouth daily., Disp: , Rfl:    clonazePAM (KLONOPIN) 0.5 MG tablet, Take 0.5 mg by mouth 2 (two) times daily as needed for anxiety., Disp: , Rfl:    cyanocobalamin (VITAMIN B12) 1000 MCG tablet, Take 1,000 mcg by mouth daily., Disp: , Rfl:    dicyclomine (BENTYL) 20 MG tablet, Take 20 mg by mouth 3 (three) times daily before meals., Disp: , Rfl:    fluticasone (FLONASE) 50 MCG/ACT nasal spray, Place 1 spray into both nostrils daily., Disp: , Rfl:    gabapentin (NEURONTIN) 100 MG capsule, Take 1 capsule (100 mg total) by mouth 2 (two) times daily., Disp: 180 capsule, Rfl: 3   levothyroxine (SYNTHROID) 88 MCG tablet, TAKE 1 TABLET(88 MCG) BY MOUTH DAILY BEFORE BREAKFAST (Patient taking differently: Take 88 mcg by  mouth daily before breakfast. TAKE 1 TABLET(88 MCG) BY MOUTH DAILY BEFORE BREAKFAST), Disp: 90 tablet, Rfl: 3   memantine (NAMENDA) 5 MG tablet, Take 1 tablet (5 mg total) by mouth daily., Disp: 30 tablet, Rfl: 3   metolazone (ZAROXOLYN) 2.5 MG tablet, TAKE 1 TABLET BY MOUTH EVERY OTHER TUESDAY. (Patient taking differently: Take 2.5 mg by mouth. Take 1 tablet by mouth every other Tuesday.), Disp: 12 tablet, Rfl: 3   midodrine (PROAMATINE) 10 MG tablet, TAKE 1 TABLET THREE TIMES DAILY WITH MEALS (Patient taking differently: Take 10 mg by mouth 3 (three) times daily. TAKE 1 TABLET THREE TIMES DAILY WITH MEALS), Disp: 270 tablet, Rfl: 3   Multiple Vitamin (MULTIVITAMIN WITH MINERALS) TABS tablet, Take 1 tablet by mouth daily., Disp: , Rfl:    nystatin (MYCOSTATIN) 100000 UNIT/ML suspension, Take 5 mLs by mouth 4 (four) times daily. (Patient not taking: Reported on 08/06/2022), Disp: , Rfl:    ondansetron (ZOFRAN) 4 MG tablet, Take 1 tablet (4 mg total) by mouth every 6 (six) hours., Disp: 15 tablet, Rfl: 0   pantoprazole (PROTONIX) 40 MG tablet, Take 40 mg by mouth  daily as needed (for acid reflux)., Disp: , Rfl:    potassium chloride SA (KLOR-CON M) 20 MEQ tablet, TAKE 2 TABLETS THREE TIMES DAILY. TAKE AN EXTRA 2 TABLETS ON DAYS YOU TAKE METOLAZONE (Patient taking differently: Take 40 mEq by mouth 3 (three) times daily.), Disp: 572 tablet, Rfl: 3   QUEtiapine (SEROQUEL) 50 MG tablet, Take 1 tablet (50 mg total) by mouth at bedtime., Disp: 30 tablet, Rfl: 3   risperiDONE (RISPERDAL) 0.5 MG tablet, Take 1 tablet (0.5 mg total) by mouth 2 (two) times daily at 8 am and 4 pm., Disp: 30 tablet, Rfl: 3   simvastatin (ZOCOR) 10 MG tablet, TAKE 1 TABLET EVERY EVENING (Patient taking differently: Take 10 mg by mouth daily at 6 PM.), Disp: 90 tablet, Rfl: 1   spironolactone (ALDACTONE) 25 MG tablet, TAKE 1 TABLET (25 MG TOTAL) BY MOUTH EVERY EVENING. (Patient taking differently: Take 25 mg by mouth daily.), Disp: 90  tablet, Rfl: 0   tiZANidine (ZANAFLEX) 2 MG tablet, Take 1 tablet (2 mg total) by mouth at bedtime as needed for muscle spasms., Disp: 90 tablet, Rfl: 1   torsemide (DEMADEX) 20 MG tablet, Take 4 tablets (80 mg total) by mouth 2 (two) times daily., Disp: 240 tablet, Rfl: 6   traZODone (DESYREL) 100 MG tablet, Take 1 tablet (100 mg total) by mouth at bedtime as needed for sleep., Disp: 30 tablet, Rfl: 3 Allergies  Allergen Reactions   Ceftin Anaphylaxis    Face and throat swell    Cefuroxime Axetil Anaphylaxis    Face and throat swell   Geodon [Ziprasidone Hcl] Hives   Lisinopril Other (See Comments)    angioedema   Cefuroxime     Other reaction(s): anaphylaxis   Sulfacetamide Sodium-Sulfur     Other reaction(s): itch   Allopurinol Nausea Only and Other (See Comments)    weakness   Ativan [Lorazepam] Itching   Sulfa Antibiotics Itching   Valium [Diazepam] Other (See Comments)    Patient states that diazepam doesn't relax, it has the opposite effect.     Social History   Socioeconomic History   Marital status: Married    Spouse name: Not on file   Number of children: 2   Years of education: 65   Highest education level: Master's degree (e.g., MA, MS, MEng, MEd, MSW, MBA)  Occupational History   Occupation: retired  Tobacco Use   Smoking status: Some Days    Packs/day: 0.10    Years: 26.00    Additional pack years: 0.00    Total pack years: 2.60    Types: Cigarettes    Last attempt to quit: 03/23/2021    Years since quitting: 1.3   Smokeless tobacco: Never  Vaping Use   Vaping Use: Never used  Substance and Sexual Activity   Alcohol use: No   Drug use: No   Sexual activity: Yes  Other Topics Concern   Not on file  Social History Narrative   Tobacco Use Cigarettes: Former Smoker, Quit in 2008   No Alcohol   No recreational drug use   Diet: Regular/Low Carb   Exercise: None   Occupation: disabled   Education: Research officer, political party, masters   Children: 2   Firearms: No    Therapist, art Use: 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      Social Determinants of Health   Financial Resource Strain: Not on file  Food Insecurity: No Food Insecurity (  07/27/2022)   Hunger Vital Sign    Worried About Running Out of Food in the Last Year: Never true    [place] in the Last Year: Never true  Transportation Needs: No Transportation Needs (07/27/2022)   PRAPARE - Hydrologist (Medical): No    Lack of Transportation (Non-Medical): No  Physical Activity: Not on file  Stress: Not on file  Social Connections: Not on file  Intimate Partner Violence: Not At Risk (07/27/2022)   Humiliation, Afraid, Rape, and Kick questionnaire    Fear of Current or Ex-Partner: No    Emotionally Abused: No    Physically Abused: No    Sexually Abused: No    Physical Exam      Future Appointments  Date Time Provider [place]  08/11/2022  9:30 AM Barbaraann Faster, RN [facility] None  08/20/2022 10:00 AM Kandis Mannan, RN [facility] None  09/09/2022  3:15 PM Marzetta Board, DPM [facility]  09/14/2022  7:10 AM [facility] DEVICE REMOTES [facility]  09/17/2022  2:40 PM Arfeen, Arlyce Harman, MD [facility] None  12/14/2022  7:10 AM [facility] DEVICE REMOTES [facility]  03/15/2023  7:10 AM [facility] DEVICE REMOTES [facility]  06/02/2023  1:00 PM Hazle Coca, PhD [facility] None  06/14/2023  7:10 AM [facility] DEVICE REMOTES [facility]  06/29/2023  1:30 PM Narda Amber K, DO [facility] None  09/13/2023  7:10 AM [facility] DEVICE REMOTES [facility]     ACTION: Home visit completed

## 2022-08-11 ENCOUNTER — Ambulatory Visit: Payer: Self-pay | Admitting: *Deleted

## 2022-08-11 NOTE — Patient Outreach (Signed)
  Care Coordination   08/11/2022 Name: PAELYN PILCH MRN: [id number] DOB: [birth year]   Care Coordination Outreach Attempts:  A second unsuccessful outreach was attempted today to offer the patient with information about available care coordination services as a benefit of their health plan.     Follow Up Plan:  Additional outreach attempts will be made to offer the patient care coordination information and services.   Encounter Outcome:  No Answer   Care Coordination Interventions:  No, not indicated    Zamara Cozad L. Lavina Hamman, RN, BSN, [place] Coordinator Office number [phone]

## 2022-08-11 NOTE — Progress Notes (Signed)
EPIC Encounter for ICM Monitoring  Patient Name: Rhonda [name] is a 72 y.o. female Date: 08/11/2022 Primary Care Physican: [name], MD Primary Cardiologist: [place] Electrophysiologist: Santina Evans Pacing: 98.5%   06/01/2022 Weight: 193 lbs  07/08/2022 Weight: 183 lbs 08/10/2022 Weight: 185 lbs          Spoke with husband, per DPR.  He reports patient is feeling okay but has a little sore throat.  She has not complained of any fluid symptoms.   Reviewed 3/18 paramedic visit by Frederico Hamman, EMT and patient has not missed any med dosages, no lower edema and weight 185 lbs at home.               Optivol thoracic impedance suggesting possible fluid accumulation starting 3/3 but trending close to baseline (due to take Metolazone today, 3/19)).      Prescribed:  Patient participating in paramedicine program to assist with meds. Torsemide 20 mg 4 tablets (80 mg total) by mouth twice a day.   Potassium 20 meq 2 tablets (40 mEq total) by mouth 3 (three) times daily.  Take extra 2 tablets on days you take Metolazone.   Metolazone 2.5mg  1 tablet every other Tuesday.  (dosage due 08/11/22) Spironolactone 25 mg take 1 tablet by mouth every evening.   Labs: 08/01/2022 Creatinine 1.47, BUN 42, Potassium 3.1, Sodium 136, GFR 38  07/26/2022 Creatinine 2.02, BUN 25, Potassium 4.6, Sodium 133, GFR 26  07/22/2022 Creatinine 1.57, BUN 20, Potassium 3.8, Sodium 136, GFR 35  07/14/2022 Creatinine 1.32, BUN 20, Potassium 4.2, Sodium 138, GFR 44 06/25/2022 Creatinine 2.19, BUN 29, Potassium 3.5, Sodium 134, GFR 24 A complete set of results can be found in Results Review.   Recommendations:  Pt scheduled to take Metolazone today, 3/19 (report is from 3/18 before Metolazone).  Advised husband to call for any changes in condition.   Follow-up plan: ICM clinic phone appointment on 09/15/2022.   91 day device clinic remote transmission 09/14/2022.     EP/Cardiology Office Visits:   Recall 07/06/2022 with Dr  Haroldine Laws.  Recall 03/20/2023 with Dr Lovena Le.     Copy of ICM check sent to Dr. Lovena Le.   3 month ICM trend: 08/10/2022.    12-14 Month ICM trend:     [name], RN 08/11/2022 10:01 AM

## 2022-08-11 NOTE — Patient Instructions (Addendum)
Visit Information  Thank you for taking time to visit with me today. Please don't hesitate to contact me if I can be of assistance to you.   Following are the goals we discussed today:   Goals Addressed             This Visit's Progress    post hospital services [facility])          Our next appointment is by telephone on 08/21/22 at 1000  Please call the care guide team at [phone] if you need to cancel or reschedule your appointment.   If you are experiencing a Mental Health or [place] or need someone to talk to, please call the Suicide and Crisis Lifeline: 988 call the [place] National Suicide Prevention Lifeline: (289)275-5633 or TTY: 559-487-0717 TTY (925)145-9724) to talk to a trained counselor call 1-800-273-TALK (toll free, 24 hour hotline) go to [facility] [address], [place] [phone]) call 911   The patient verbalized understanding of instructions, educational materials, and care plan provided today and DECLINED offer to receive copy of patient instructions, educational materials, and care plan.   The patient has been provided with contact information for the care management team and has been advised to call with any health related questions or concerns.   Joann Kulpa L. Lavina Hamman, RN, BSN, [place] Coordinator Office number [phone]

## 2022-08-13 DIAGNOSIS — I130 Hypertensive heart and chronic kidney disease with heart failure and stage 1 through stage 4 chronic kidney disease, or unspecified chronic kidney disease: Secondary | ICD-10-CM | POA: Diagnosis not present

## 2022-08-13 DIAGNOSIS — I509 Heart failure, unspecified: Secondary | ICD-10-CM | POA: Diagnosis not present

## 2022-08-13 DIAGNOSIS — F322 Major depressive disorder, single episode, severe without psychotic features: Secondary | ICD-10-CM | POA: Diagnosis not present

## 2022-08-17 ENCOUNTER — Other Ambulatory Visit (HOSPITAL_COMMUNITY): Payer: Self-pay

## 2022-08-17 NOTE — Progress Notes (Signed)
Paramedicine Encounter    Patient ID: Rhonda [name], female    DOB: [birth year], 72 y.o.   MRN: [id number]   Complaints- right foot pain (feels like gout she reports)  Assessment- some swelling in lower right ankle and foot, lungs clear, she is alert and oriented.   Compliance with meds- missed one days worth of medications   Pill box filled- for one week.   Refills needed- risperidone   Meds changes since last visit- none     Social changes- none    BP 110/62   Pulse 100   Resp 16   Wt 183 lb (83 kg)   SpO2 98%   BMI 35.74 kg/m  Weight yesterday-didn't weigh Last visit weight-- 185lbs  Arrived for home visit for Rhonda [name] where she was laying in bed- alert and oriented and reporting to be feeling okay other than having some right foot pain that feels like her last gout flare up. She is planning to follow up at Urgent Care tomorrow for same. We reviewed meds and med compliance. She agreed with plans. I obtained vitals. We reviewed upcoming appointments. Home visit complete. I will see her in one week.    [name], [place]  ACTION: Home visit completed    Patient Care Team: [name], MD as PCP - General (Family Medicine) [name], LCSW as Social Worker (Licensed Clinical Social Worker) [name], [name], DO as Consulting Physician (Neurology) [name], Paramedic as Paramedic [name], [name], MD as Attending Physician (Family Medicine) Barbaraann Faster, RN as [place] Management Bensimhon, Shaune Pascal, MD as Consulting Physician (Cardiology)  Patient Active Problem List   Diagnosis Date Noted   Major depressive disorder, recurrent episode, severe ([place]) 07/27/2022   MDD (major depressive disorder), recurrent severe, without psychosis ([place]) 07/27/2022   MDD (major depressive disorder) 07/27/2022   Elevated troponin 12/02/2021   Nausea & vomiting 12/01/2021   Acute left ankle pain 10/11/2021    Intractable nausea and vomiting 10/08/2021   AKI (acute kidney injury) ([place]) 10/08/2021   Hyponatremia 10/08/2021   Decreased estrogen level 08/01/2021   Dementia ([place]) 08/01/2021   Frail elderly 08/01/2021   Hyperparathyroidism ([place]) 08/01/2021   Spondylolisthesis 08/01/2021   Varicose veins of bilateral lower extremities with other complications [id number]   Lumbago without sciatica 04/30/2021   Abnormal gait 06/11/2020   Adult failure to thrive syndrome 06/11/2020   Allergic rhinitis 06/11/2020   Anxiety 06/11/2020   Asthma 06/11/2020   Benign intracranial hypertension 06/11/2020   Body mass index (BMI) 45.0-49.9, adult ([place]) 06/11/2020   Bowel incontinence 06/11/2020   Cholelithiasis 06/11/2020   Chronic sinusitis 06/11/2020   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   Daytime somnolence 06/11/2020   Diarrhea of presumed infectious origin 06/11/2020   Edema 06/11/2020   Family history of malignant neoplasm of gastrointestinal tract 06/11/2020   Gout 06/11/2020   History of fall 06/11/2020   History of infectious disease 06/11/2020   Insomnia 06/11/2020   Irregular bowel habits 06/11/2020   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   Lumbar spondylosis with myelopathy 06/11/2020   Macrocytosis 06/11/2020   Mild recurrent major depression ([place]) 06/11/2020   Personal history of colonic polyps 06/11/2020   Personal history of malignant neoplasm of breast 06/11/2020   Repeated falls 06/11/2020   Mixed anxiety and depressive disorder 06/11/2020   Irritable bowel syndrome 01/04/2020   Spinal stenosis of lumbar region 01/03/2020   Other symptoms and signs involving the musculoskeletal system 09/11/2019   Bilateral  leg weakness 08/01/2019   Leg swelling 08/01/2019   Diabetes mellitus with neuropathy ([place]) 06/15/2019   Hypokalemia 11/08/2018   CKD (chronic kidney disease) stage 3, GFR 30-59 ml/min (HCC) 11/08/2018   Orthostatic hypotension 07/28/2017   SVD (spontaneous vaginal delivery)    Peripheral  neuropathy    On home oxygen therapy    Migraines    Left bundle branch block    Hypothyroidism    Hypertension    Hyperlipidemia    Heart murmur    GERD (gastroesophageal reflux disease)    Exertional shortness of breath    Major depressive disorder    Back pain    Arthritis    Generalized anxiety disorder    Anemia    Chronic respiratory failure ([place]) 09/14/2013   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 (Patient not taking: Reported on 08/06/2022), Disp: , Rfl:    buPROPion (WELLBUTRIN XL) 150 MG 24 hr tablet, Take 150 mg by mouth daily., Disp: , Rfl:    carvedilol (COREG) 3.125 MG tablet, TAKE 1 TABLET (3.125 MG) BY MOUTH TWICE DAILY WITH MEALS (Patient taking differently: Take 3.125 mg by mouth 2 (two) times daily with a meal. TAKE 1 TABLET (3.125 MG) BY MOUTH TWICE DAILY WITH MEALS), Disp: 180 tablet, Rfl: 3   citalopram (CELEXA) 20 MG tablet, Take 20 mg by mouth daily., Disp: , Rfl:    clonazePAM (KLONOPIN) 0.5 MG tablet, Take 0.5 mg by mouth 2 (two) times daily as needed for anxiety., Disp: , Rfl:    cyanocobalamin (VITAMIN B12) 1000 MCG tablet, Take 1,000 mcg by mouth daily., Disp: , Rfl:    dicyclomine (BENTYL) 20 MG tablet, Take 20 mg by mouth 3 (three) times daily before meals., Disp: , Rfl:    fluticasone (FLONASE) 50 MCG/ACT nasal spray, Place 1 spray into both nostrils daily., Disp: , Rfl:    gabapentin (NEURONTIN) 100 MG capsule, Take 1 capsule (100 mg total) by mouth 2 (two) times daily., Disp: 180 capsule, Rfl:  3   levothyroxine (SYNTHROID) 88 MCG tablet, TAKE 1 TABLET(88 MCG) BY MOUTH DAILY BEFORE BREAKFAST (Patient taking differently: Take 88 mcg by mouth daily before breakfast. TAKE 1 TABLET(88 MCG) BY MOUTH DAILY BEFORE BREAKFAST), Disp: 90 tablet, Rfl: 3   memantine (NAMENDA) 5 MG tablet, Take 1 tablet (5 mg total) by mouth daily., Disp: 30 tablet, Rfl: 3   metolazone (ZAROXOLYN) 2.5 MG tablet, TAKE 1 TABLET BY MOUTH EVERY OTHER TUESDAY. (Patient taking differently: Take 2.5 mg by mouth. Take 1 tablet by mouth every other Tuesday.), Disp: 12 tablet, Rfl: 3   midodrine (PROAMATINE) 10 MG tablet, TAKE 1 TABLET THREE TIMES DAILY WITH MEALS (Patient taking differently: Take 10 mg by mouth 3 (three) times daily. TAKE 1 TABLET THREE TIMES DAILY WITH MEALS), Disp: 270 tablet, Rfl: 3   Multiple Vitamin (MULTIVITAMIN WITH MINERALS) TABS tablet, Take 1 tablet by mouth daily., Disp: , Rfl:    nystatin (MYCOSTATIN) 100000 UNIT/ML suspension, Take 5 mLs by mouth 4 (four) times daily. (Patient not taking: Reported on  08/06/2022), Disp: , Rfl:    ondansetron (ZOFRAN) 4 MG tablet, Take 1 tablet (4 mg total) by mouth every 6 (six) hours. (Patient not taking: Reported on 08/10/2022), Disp: 15 tablet, Rfl: 0   pantoprazole (PROTONIX) 40 MG tablet, Take 40 mg by mouth daily as needed (for acid reflux)., Disp: , Rfl:    potassium chloride SA (KLOR-CON M) 20 MEQ tablet, TAKE 2 TABLETS THREE TIMES DAILY. TAKE AN EXTRA 2 TABLETS ON DAYS YOU TAKE METOLAZONE (Patient taking differently: Take 40 mEq by mouth 3 (three) times daily.), Disp: 572 tablet, Rfl: 3   QUEtiapine (SEROQUEL) 50 MG tablet, Take 1 tablet (50 mg total) by mouth at bedtime., Disp: 30 tablet, Rfl: 3   risperiDONE (RISPERDAL) 0.5 MG tablet, Take 1 tablet (0.5 mg total) by mouth 2 (two) times daily at 8 am and 4 pm., Disp: 30 tablet, Rfl: 3   simvastatin (ZOCOR) 10 MG tablet, TAKE 1 TABLET EVERY EVENING, Disp: 90 tablet, Rfl: 1   spironolactone (ALDACTONE) 25 MG  tablet, TAKE 1 TABLET (25 MG TOTAL) BY MOUTH EVERY EVENING., Disp: 90 tablet, Rfl: 0   tiZANidine (ZANAFLEX) 2 MG tablet, Take 1 tablet (2 mg total) by mouth at bedtime as needed for muscle spasms., Disp: 90 tablet, Rfl: 1   torsemide (DEMADEX) 20 MG tablet, Take 4 tablets (80 mg total) by mouth 2 (two) times daily., Disp: 240 tablet, Rfl: 6   traZODone (DESYREL) 100 MG tablet, Take 1 tablet (100 mg total) by mouth at bedtime as needed for sleep., Disp: 30 tablet, Rfl: 3 Allergies  Allergen Reactions   Ceftin Anaphylaxis    Face and throat swell    Cefuroxime Axetil Anaphylaxis    Face and throat swell   Geodon [Ziprasidone Hcl] Hives   Lisinopril Other (See Comments)    angioedema   Cefuroxime     Other reaction(s): anaphylaxis   Sulfacetamide Sodium-Sulfur     Other reaction(s): itch   Allopurinol Nausea Only and Other (See Comments)    weakness   Ativan [Lorazepam] Itching   Sulfa Antibiotics Itching   Valium [Diazepam] Other (See Comments)    Patient states that diazepam doesn't relax, it has the opposite effect.     Social History   Socioeconomic History   Marital status: Married    Spouse name: Not on file   Number of children: 2   Years of education: 43   Highest education level: Master's degree (e.g., MA, MS, MEng, MEd, MSW, MBA)  Occupational History   Occupation: retired  Tobacco Use   Smoking status: Some Days    Packs/day: 0.10    Years: 26.00    Additional pack years: 0.00    Total pack years: 2.60    Types: Cigarettes    Last attempt to quit: 03/23/2021    Years since quitting: 1.4   Smokeless tobacco: Never  Vaping Use   Vaping Use: Never used  Substance and Sexual Activity   Alcohol use: No   Drug use: No   Sexual activity: Yes  Other Topics Concern   Not on file  Social History Narrative   Tobacco Use Cigarettes: Former Smoker, Quit in 2008   No Alcohol   No recreational drug use   Diet: Regular/Low Carb   Exercise: None   Occupation:  disabled   Education: Research officer, political party, masters   Children: 2   Firearms: No   Therapist, art Use: Always   Former Metallurgist.    Right handed  Two story home      Social Determinants of Health   Financial Resource Strain: Not on file  Food Insecurity: No Food Insecurity (08/11/2022)   Hunger Vital Sign    Worried About Running Out of Food in the Last Year: Never true    Ran Out of Food in the Last Year: Never true  Transportation Needs: No Transportation Needs (08/11/2022)   PRAPARE - Hydrologist (Medical): No    Lack of Transportation (Non-Medical): No  Physical Activity: Not on file  Stress: No Stress Concern Present (08/11/2022)   [place]    Feeling of Stress : Only a little  Social Connections: Not on file  Intimate Partner Violence: Not At Risk (07/27/2022)   Humiliation, Afraid, Rape, and Kick questionnaire    Fear of Current or Ex-Partner: No    Emotionally Abused: No    Physically Abused: No    Sexually Abused: No    Physical Exam      Future Appointments  Date Time Provider [place]  08/20/2022 10:00 AM Kandis Mannan, RN ACP-[facility] None  08/21/2022 10:00 AM Barbaraann Faster, RN [facility] None  09/09/2022  3:15 PM Marzetta Board, DPM [facility]  09/14/2022  7:10 AM [facility] DEVICE REMOTES [facility]  09/15/2022  7:00 AM [facility] DEVICE REMOTES [facility]  09/17/2022  2:40 PM Arfeen, Arlyce Harman, MD [facility] None  12/14/2022  7:10 AM [facility] DEVICE REMOTES [facility]  03/15/2023  7:10 AM [facility] DEVICE REMOTES [facility]  06/02/2023  1:00 PM Hazle Coca, PhD [facility] None  06/14/2023  7:10 AM [facility] DEVICE REMOTES [facility]  06/29/2023  1:30 PM Narda Amber K, DO [facility] None  09/13/2023  7:10 AM [facility] DEVICE REMOTES [facility]

## 2022-08-20 ENCOUNTER — Other Ambulatory Visit: Payer: Medicare HMO

## 2022-08-20 VITALS — BP 110/72 | HR 101 | Temp 98.1°F

## 2022-08-20 DIAGNOSIS — Z515 Encounter for palliative care: Secondary | ICD-10-CM

## 2022-08-20 NOTE — Progress Notes (Signed)
PATIENT NAME: Rhonda [name] DOB: [birth year] MRN: [id number]  PRIMARY CARE PROVIDER: [name], MD  RESPONSIBLE PARTY:  Acct ID - Guarantor Home Phone Work Phone Relationship Acct Type  [id number] DEBARA, [phone]  Self P/F     [place], [place], [place] [address]   Palliative Care Initial Encounter Note   LPN completed home visit with Katheren Puller, [place]  also present.      Respiratory: no issues at this time; gets SOB with ambulation and will rest when needed   Cardiac: CHF; no edema     Cognitive: alert, continues to ask the same question over and over; forgetful; husband reports some confusion and memory lapse; pt was not interactive with her husband during this visit - she asked that he leave the room; pt was teary during this visit and acknowledged that "today is not a good day" Education officer, museum to   Appetite: no longer gets Meals for Mom; reports she doesn't have a good appetite but will eat a few forks full of the meals she microwaves; drinks 4-6 cups daily; has no appetite   GI/GU: ; no urinary issues today   Mobility: ambulates with a cane throughout the house and outside of the house; did not use cane when ambulating around the house today; when she ambulates for a long period she will stop and rest or sit until she feels rested enough to walk again   ADLs: able to complete ADL's independently; pt reports Mingo Amber comes twice a week to clean; fix her a meal; have Bible study and assist her as needed   Sleeping Pattern: sleeps through the night   Pain: 8/10 foot pain level   Wt: weighs 183lbs   Meds: Dr. Clayborne Dana office rep [name]) comes to the home weekly to fill the pt's pill box  Neuropathy: reports 8/10 pain level to the lateral side and bottom of R foot      Goals of Care: To stay in the home with her husband as long as possible     CODE STATUS: Full Code ADVANCED DIRECTIVES: Y MOST FORM: Y PPS: 70%   PHYSICAL  EXAM:   VITALS: Today's Vitals   08/20/22 1036  BP: 110/72  Pulse: (!) 101  Temp: 98.1 F (36.7 C)  TempSrc: Temporal  SpO2: 97%  PainSc: 8   PainLoc: Foot    LUNGS: clear to auscultation  CARDIAC: Cor RRR EXTREMITIES: no edema; AROM x4 SKIN: Skin color, texture, turgor normal. No rashes or lesions  NEURO: positive for memory problems    Next scheduled appt on 09/22/22 @ 2pm    Eulan Heyward Georgann Housekeeper, LPN

## 2022-08-20 NOTE — Progress Notes (Signed)
PC SW completed a consultation call with [name] to update her on patient status today during visit (confused, tearful, flat affect). [name] has observed some changes/decline in patient also. Palliative care and [name] will continue to consult with each other regarding patient care. Contact information exchanged. No other concerns

## 2022-08-20 NOTE — Progress Notes (Signed)
COMMUNITY PALLIATIVE CARE SW NOTE  PATIENT NAME: Rhonda [name] DOB: 05-23-1951 MRN: [id number]  PRIMARY CARE PROVIDER: [name], MD  RESPONSIBLE PARTY:  Acct ID - Guarantor Home Phone Work Phone Relationship Acct Type  [id number] KARICIA, [phone]  Self P/F     [address] [place], [place] [address]   Palliative Care Follow-up Visit/Clinical Social Work  PC SW and nurse-D. Georgann Housekeeper completed a follow-up visit with patient post hospitalization at her home. Patient observed in her bedroom still in her nightgown. Patient's husband was present and attempted to be a part of the visit. The patient refused to allow him to be in the visit, stating "today is not a good day." Patient verbalized that she had no recollection of being in the hospital. Patient appears to be having more short-term memory deficits as a lot of questions she responded with "I don't know, I don't remember." Patient report pain to her right foot (top of her foot and bottom heel) due to neuropathy. Patient rated her pain as an 8 or 8 .  Patient report some SOB when she go up and down the stairs. Patient most recent weight is 183 lbs. from 190 lbs. a month ago. Patient report that she does not have an appetite. She states "I will eat a few fork full of food". Patient also report that she has throat pain that contributes to her not eating. Her meals consist mostly of frozen entrees.  She report sleeping well. Patient is using a cane (in and outside the home) since returning from the hospital due to the right foot pain. When asked if she had any concerns or anxiety about anything she responded "It a lot. I just take it day by day". SW discussed patient received counseling in the past and is interested in receiving counseling again. She asked SW to coordinate this with [facility]. Patient became very tearful at the end of the visit, but was not open to discussing any concerns.  Patient has a CMA from Dr. Mortimer Fries  office to come 1x week to fill her medication. Patient has a private caregiver 1-2x week to assist with household chores and meal prep etc.   *Next scheduled appointment on 09/22/22 @ 2pm.     Social History   Tobacco Use   Smoking status: Some Days    Packs/day: 0.10    Years: 26.00    Additional pack years: 0.00    Total pack years: 2.60    Types: Cigarettes    Last attempt to quit: 03/23/2021    Years since quitting: 1.4   Smokeless tobacco: Never  Substance Use Topics   Alcohol use: No    CODE STATUS: DNR ADVANCED DIRECTIVES: No MOST FORM COMPLETE:  Yes HOSPICE EDUCATION PROVIDED: Yes  Duration of visit and documentation: 60 minutes  Kennie Snedden, LCSW

## 2022-08-21 ENCOUNTER — Ambulatory Visit: Payer: Self-pay | Admitting: *Deleted

## 2022-08-21 NOTE — Patient Outreach (Addendum)
  Care Coordination   Follow Up Visit Note   08/21/2022 Name: Rhonda [name] MRN: [id number] DOB: 19-Jan-1951  Rhonda [name] is a 72 y.o. year old female who sees Rhonda [name], Rhonda Pall, MD for primary care. I spoke with  Rhonda [name], spouse of Rhonda [name] by phone today.  What matters to the patients health and wellness today?  Working with palliative care  Completed visits on 08/20/22 Voices appreciation of outreach form [facility] RN CM.  Denies any immediate needs and will outreach as needed Agree to follow up in 2 weeks     Goals Addressed             This Visit's Progress    THN care coordination post hospital services   On track    Interventions Today    Flowsheet Row Most Recent Value  Chronic Disease   Chronic disease during today's visit Other  [offer [facility] RN CM services for any resource or need while working with palliative care]  General Interventions   General Interventions Discussed/Reviewed --  [employer] Rhonda [name] with RN CM office number and the [facility] 24 hour nurse line number, Encouraged him to outreach as needed. plus to outreach to palliative care  staff as needed prior to the next month visit]  [place] Discussed/Reviewed Coping Strategies, Mental Health Discussed  [confirmed involved with palliative care RN & SW]              SDOH assessments and interventions completed:  No     Care Coordination Interventions:  Yes, provided   Follow up plan: Follow up call scheduled for 09/04/22    Encounter Outcome:  Pt. Visit Completed   Rhonda [name] L. Lavina Hamman, RN, BSN, [place]egino Ramirez Coordinator Office number [phone]

## 2022-08-21 NOTE — Patient Instructions (Addendum)
Visit Information  Thank you for taking time to visit with me today. Please don't hesitate to contact me if I can be of assistance to you.   Following are the goals we discussed today:   Goals Addressed             This Visit's Progress    THN care coordination post hospital services   On track    Interventions Today    Flowsheet Row Most Recent Value  Chronic Disease   Chronic disease during today's visit Other  [offer [facility] RN CM services for any resource or need while working with palliative care]  General Interventions   General Interventions Discussed/Reviewed --  [employer] Mr Briar [place] with RN CM office number and the [facility] 24 hour nurse line number, Encouraged him to outreach as needed. plus to outreach to palliative care  staff as needed prior to the next month visit]  [place] Discussed/Reviewed Coping Strategies, Mental Health Discussed  [confirmed involved with palliative care RN & SW]              Our next appointment is by telephone on 09/04/22 at 1030  Please call the care guide team at [phone] if you need to cancel or reschedule your appointment.   If you are experiencing a Mental Health or [place] or need someone to talk to, please call the Suicide and Crisis Lifeline: 988 call the [place] National Suicide Prevention Lifeline: (260) 721-6233 or TTY: 804 503 3597 TTY 289-544-9394) to talk to a trained counselor call 1-800-273-TALK (toll free, 24 hour hotline) go to [facility] [address], [place] [phone]) call 911   The patient verbalized understanding of instructions, educational materials, and care plan provided today and DECLINED offer to receive copy of patient instructions, educational materials, and care plan.   The patient has been provided with contact information for the care management team and has been advised to call with any health related questions or  concerns.    Shiva Sahagian L. Lavina Hamman, RN, BSN, [place] Coordinator Office number [phone]

## 2022-08-23 ENCOUNTER — Other Ambulatory Visit (HOSPITAL_COMMUNITY): Payer: Self-pay | Admitting: Physician Assistant

## 2022-08-24 ENCOUNTER — Other Ambulatory Visit (HOSPITAL_COMMUNITY): Payer: Self-pay

## 2022-08-24 NOTE — Progress Notes (Signed)
Paramedicine Encounter    Patient ID: Rhonda [name], female    DOB: 11-14-1950, 72 y.o.   MRN: [id number]   Complaints- foot pain   Assessment- CAOX4, warm and dry right foot pain, no lower leg edema noted, lungs clear, vitals stable.   Compliance with meds- missed Wednesday AM< NOON< EVE doses.   Pill box filled- for one week.   Refills needed- rispieridone, seroquel   Meds changes since last visit- none     Social changes- none    BP 120/70   Pulse 96   Resp 16   Wt 182 lb (82.6 kg)   SpO2 98%   BMI 35.54 kg/m  Weight yesterday-didn't weigh  Last visit weight-- 183lbs    Met with Mikeya in the home today where she was laying in bed on my arrival- she reports to be feeling okay other than right foot pain that hurts to bear weight on. She denied any recent falls or trauma to same. She has had issues with gout in the past- I advised her and her husband to call her PCP for same. I obtained vitals and assessment as noted. Lungs clear, no lower leg edema, vitals and weight stable. She did miss one days worth of medication on Wednesday. I reminded her the importance of taking her medications daily and three times daily as I have it set up in her pill box. She agreed with same. We reviewed diet and med compliance. She reports today she feels her mental health medications are working but she hates having to take so many pills-we talked through this and I explained the importance of all meds at this time. She agreed. Home visit complete. I plan to see her in one week.   [name], [place]  ACTION: Home visit completed    Patient Care Team: [name], MD as PCP - General (Family Medicine) [name], LCSW as Social Worker (Licensed Clinical Social Worker) [name], [name], DO as Consulting Physician (Neurology) [name], Paramedic as Paramedic [name], [name], MD as Attending Physician (Family Medicine) Barbaraann Faster, RN  as [place] [facility] Bensimhon, Shaune Pascal, MD as Consulting Physician (Cardiology)  Patient Active Problem List   Diagnosis Date Noted   Major depressive disorder, recurrent episode, severe 07/27/2022   MDD (major depressive disorder), recurrent severe, without psychosis 07/27/2022   MDD (major depressive disorder) 07/27/2022   Elevated troponin 12/02/2021   Nausea & vomiting 12/01/2021   Acute left ankle pain 10/11/2021   Intractable nausea and vomiting 10/08/2021   AKI (acute kidney injury) 10/08/2021   Hyponatremia 10/08/2021   Decreased estrogen level 08/01/2021   Dementia 08/01/2021   Frail elderly 08/01/2021   Hyperparathyroidism 08/01/2021   Spondylolisthesis 08/01/2021   Varicose veins of bilateral lower extremities with other complications [id number]   Lumbago without sciatica 04/30/2021   Abnormal gait 06/11/2020   Adult failure to thrive syndrome 06/11/2020   Allergic rhinitis 06/11/2020   Anxiety 06/11/2020   Asthma 06/11/2020   Benign intracranial hypertension 06/11/2020   Body mass index (BMI) 45.0-49.9, adult 06/11/2020   Bowel incontinence 06/11/2020   Cholelithiasis 06/11/2020   Chronic sinusitis 06/11/2020   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   Daytime somnolence 06/11/2020   Diarrhea of presumed infectious origin 06/11/2020   Edema 06/11/2020   Family history of malignant neoplasm of gastrointestinal tract 06/11/2020   Gout 06/11/2020   History of fall 06/11/2020   History of infectious disease 06/11/2020   Insomnia 06/11/2020  Irregular bowel habits 06/11/2020   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   Lumbar spondylosis with myelopathy 06/11/2020   Macrocytosis 06/11/2020   Mild recurrent major depression 06/11/2020   Personal history of colonic polyps 06/11/2020   Personal history of malignant neoplasm of breast 06/11/2020   Repeated falls 06/11/2020   Mixed anxiety and depressive disorder 06/11/2020   Irritable bowel syndrome 01/04/2020    Spinal stenosis of lumbar region 01/03/2020   Other symptoms and signs involving the musculoskeletal system 09/11/2019   Bilateral leg weakness 08/01/2019   Leg swelling 08/01/2019   Diabetes mellitus with neuropathy 06/15/2019   Hypokalemia 11/08/2018   CKD (chronic kidney disease) stage 3, GFR 30-59 ml/min 11/08/2018   Orthostatic hypotension 07/28/2017   SVD (spontaneous vaginal delivery)    Peripheral neuropathy    On home oxygen therapy    Migraines    Left bundle branch block    Hypothyroidism    Hypertension    Hyperlipidemia    Heart murmur    GERD (gastroesophageal reflux disease)    Exertional shortness of breath    Major depressive disorder    Back pain    Arthritis    Generalized anxiety disorder    Anemia    Chronic respiratory failure 09/14/2013   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   Morbid obesity 0000000   Chronic systolic heart failure [id number]   Endometrial polyp 01/20/2012   Malignant tumor of breast 03/26/2011   Unspecified vitamin D deficiency 03/26/2011    Current Outpatient Medications:    acetaZOLAMIDE (DIAMOX) 125 MG tablet, Take 1 tablet (125 mg total) by mouth daily., Disp: 90 tablet, Rfl: 3   Ascorbic Acid (VITAMIN C) 1000 MG tablet, Take 1,000 mg by mouth daily., Disp: , Rfl:    buPROPion (WELLBUTRIN XL) 150 MG 24 hr tablet, Take 150 mg by mouth daily., Disp: , Rfl:    carvedilol (COREG) 3.125 MG tablet, TAKE 1 TABLET (3.125 MG) BY MOUTH TWICE DAILY WITH MEALS (Patient taking differently: Take 3.125 mg by mouth 2 (two) times daily with a meal. TAKE 1 TABLET (3.125 MG) BY MOUTH TWICE DAILY WITH MEALS), Disp: 180 tablet, Rfl: 3   citalopram (CELEXA) 20 MG tablet, Take 20 mg by mouth daily., Disp: , Rfl:    clonazePAM (KLONOPIN) 0.5 MG tablet, Take 0.5 mg by mouth 2 (two) times daily as needed for anxiety., Disp: , Rfl:    cyanocobalamin (VITAMIN B12) 1000 MCG tablet, Take 1,000 mcg by mouth daily.,  Disp: , Rfl:    dicyclomine (BENTYL) 20 MG tablet, Take 20 mg by mouth 3 (three) times daily before meals., Disp: , Rfl:    fluticasone (FLONASE) 50 MCG/ACT nasal spray, Place 1 spray into both nostrils daily., Disp: , Rfl:    gabapentin (NEURONTIN) 100 MG capsule, Take 1 capsule (100 mg total) by mouth 2 (two) times daily., Disp: 180 capsule, Rfl: 3   levothyroxine (SYNTHROID) 88 MCG tablet, TAKE 1 TABLET(88 MCG) BY MOUTH DAILY BEFORE BREAKFAST (Patient taking differently: Take 88 mcg by mouth daily before breakfast. TAKE 1 TABLET(88 MCG) BY MOUTH DAILY BEFORE BREAKFAST), Disp: 90 tablet, Rfl: 3   memantine (NAMENDA) 5 MG tablet, Take 1 tablet (5 mg total) by mouth daily., Disp: 30 tablet, Rfl: 3   metolazone (ZAROXOLYN) 2.5 MG tablet, TAKE 1 TABLET BY MOUTH EVERY OTHER TUESDAY. (Patient taking differently: Take 2.5 mg by mouth. Take 1 tablet by mouth every other Tuesday.), Disp: 12 tablet, Rfl: 3   midodrine (PROAMATINE) 10 MG  tablet, TAKE 1 TABLET THREE TIMES DAILY WITH MEALS (Patient taking differently: Take 10 mg by mouth 3 (three) times daily. TAKE 1 TABLET THREE TIMES DAILY WITH MEALS), Disp: 270 tablet, Rfl: 3   Multiple Vitamin (MULTIVITAMIN WITH MINERALS) TABS tablet, Take 1 tablet by mouth daily., Disp: , Rfl:    pantoprazole (PROTONIX) 40 MG tablet, Take 40 mg by mouth daily as needed (for acid reflux)., Disp: , Rfl:    potassium chloride SA (KLOR-CON M) 20 MEQ tablet, TAKE 2 TABLETS THREE TIMES DAILY. TAKE AN EXTRA 2 TABLETS ON DAYS YOU TAKE METOLAZONE (Patient taking differently: Take 40 mEq by mouth 3 (three) times daily.), Disp: 572 tablet, Rfl: 3   QUEtiapine (SEROQUEL) 50 MG tablet, Take 1 tablet (50 mg total) by mouth at bedtime., Disp: 30 tablet, Rfl: 3   risperiDONE (RISPERDAL) 0.5 MG tablet, Take 1 tablet (0.5 mg total) by mouth 2 (two) times daily at 8 am and 4 pm., Disp: 30 tablet, Rfl: 3   simvastatin (ZOCOR) 10 MG tablet, TAKE 1 TABLET EVERY EVENING, Disp: 90 tablet, Rfl: 3    spironolactone (ALDACTONE) 25 MG tablet, TAKE 1 TABLET (25 MG TOTAL) BY MOUTH EVERY EVENING., Disp: 90 tablet, Rfl: 0   tiZANidine (ZANAFLEX) 2 MG tablet, Take 1 tablet (2 mg total) by mouth at bedtime as needed for muscle spasms., Disp: 90 tablet, Rfl: 1   torsemide (DEMADEX) 20 MG tablet, Take 4 tablets (80 mg total) by mouth 2 (two) times daily., Disp: 240 tablet, Rfl: 6   traZODone (DESYREL) 100 MG tablet, Take 1 tablet (100 mg total) by mouth at bedtime as needed for sleep., Disp: 30 tablet, Rfl: 3   nystatin (MYCOSTATIN) 100000 UNIT/ML suspension, Take 5 mLs by mouth 4 (four) times daily. (Patient not taking: Reported on 08/06/2022), Disp: , Rfl:    ondansetron (ZOFRAN) 4 MG tablet, Take 1 tablet (4 mg total) by mouth every 6 (six) hours. (Patient not taking: Reported on 08/10/2022), Disp: 15 tablet, Rfl: 0 Allergies  Allergen Reactions   Ceftin Anaphylaxis    Face and throat swell    Cefuroxime Axetil Anaphylaxis    Face and throat swell   Geodon [Ziprasidone Hcl] Hives   Lisinopril Other (See Comments)    angioedema   Cefuroxime     Other reaction(s): anaphylaxis   Sulfacetamide Sodium-Sulfur     Other reaction(s): itch   Allopurinol Nausea Only and Other (See Comments)    weakness   Ativan [Lorazepam] Itching   Sulfa Antibiotics Itching   Valium [Diazepam] Other (See Comments)    Patient states that diazepam doesn't relax, it has the opposite effect.     Social History   Socioeconomic History   Marital status: Married    Spouse name: Not on file   Number of children: 2   Years of education: 7   Highest education level: Master's degree (e.g., MA, MS, MEng, MEd, MSW, MBA)  Occupational History   Occupation: retired  Tobacco Use   Smoking status: Some Days    Packs/day: 0.10    Years: 26.00    Additional pack years: 0.00    Total pack years: 2.60    Types: Cigarettes    Last attempt to quit: 03/23/2021    Years since quitting: 1.4   Smokeless tobacco: Never   Vaping Use   Vaping Use: Never used  Substance and Sexual Activity   Alcohol use: No   Drug use: No   Sexual activity: Yes  Other Topics Concern  Not on file  Social History Narrative   Tobacco Use Cigarettes: Former Smoker, Quit in 2008   No Alcohol   No recreational drug use   Diet: Regular/Low Carb   Exercise: None   Occupation: disabled   Education: Research officer, political party, masters   Children: 2   Firearms: No   Therapist, art Use: 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      Social Determinants of Health   Financial Resource Strain: Not on file  Food Insecurity: No Food Insecurity (08/11/2022)   Hunger Vital Sign    Worried About Running Out of Food in the Last Year: Never true    Ran Out of Food in the Last Year: Never true  Transportation Needs: No Transportation Needs (08/11/2022)   PRAPARE - Hydrologist (Medical): No    Lack of Transportation (Non-Medical): No  Physical Activity: Not on file  Stress: No Stress Concern Present (08/11/2022)   [place]    Feeling of Stress : Only a little  Social Connections: Not on file  Intimate Partner Violence: Not At Risk (07/27/2022)   Humiliation, Afraid, Rape, and Kick questionnaire    Fear of Current or Ex-Partner: No    Emotionally Abused: No    Physically Abused: No    Sexually Abused: No    Physical Exam      Future Appointments  Date Time Provider [place]  09/04/2022 10:30 AM Barbaraann Faster, RN [facility] None  09/09/2022  3:15 PM Marzetta Board, DPM [facility]  09/14/2022  7:10 AM [facility] DEVICE REMOTES [facility]  09/15/2022  7:00 AM [facility] DEVICE REMOTES [facility]  09/17/2022  2:40 PM Arfeen, Arlyce Harman, MD [facility] None  09/22/2022  2:00 PM Kandis Mannan, RN [facility] None  12/14/2022  7:10 AM [facility] DEVICE REMOTES [facility]   03/15/2023  7:10 AM [facility] DEVICE REMOTES [facility]  06/02/2023  1:00 PM Hazle Coca, PhD [facility] None  06/14/2023  7:10 AM [facility] DEVICE REMOTES [facility]  06/29/2023  1:30 PM Narda Amber K, DO [facility] None  09/13/2023  7:10 AM [facility] DEVICE REMOTES [facility]

## 2022-08-26 ENCOUNTER — Telehealth (HOSPITAL_COMMUNITY): Payer: Self-pay | Admitting: Licensed Clinical Social Worker

## 2022-08-26 NOTE — Telephone Encounter (Signed)
H&V Care Navigation CSW Progress   Patient identified as a candidate to receive 7 heart healthy meals per week for 4 weeks through [facility].  Completed referral sent in for review.  Anticipate patient will receive first shipment of food in 1-3 business days.   SDOH Screenings   Food Insecurity: No Food Insecurity (08/11/2022)  Housing: Low Risk  (08/11/2022)  Transportation Needs: No Transportation Needs (08/11/2022)  Utilities: Not At Risk (08/11/2022)  Alcohol Screen: Low Risk  (07/27/2022)  Depression (PHQ2-9): Low Risk  (08/11/2022)  Stress: No Stress Concern Present (08/11/2022)  Tobacco Use: High Risk (08/20/2022)    [name], LCSW Clinical Social Worker [facility] Desk#: [phone] Cell#: [phone]

## 2022-08-31 ENCOUNTER — Other Ambulatory Visit (HOSPITAL_COMMUNITY): Payer: Self-pay

## 2022-08-31 NOTE — Progress Notes (Signed)
Paramedicine Encounter    Patient ID: Rhonda [name], female    DOB: [birth year], 72 y.o.   MRN: [id number]   Complaints- foot pain  Assessment- CAOx4, ambulatory, no shortness of breath, no chest pain, no dizziness, no lower leg edema noted.   Compliance with meds- missed one night dose   Pill box filled- for one week   Refills needed- Seroquel, Risperidone, Namenda   Meds changes since last visit- none     Social changes- none   Arrived for home visit for Rhonda [name] who reports to be feeling okay other than ongoing foot pain which she believes is gout. She did not seek further medical attention, I advised her to call her PCP tomorrow to seek an appointment or prescription for gout. She agreed. I obtained vitals and assessment as noted. Weight is up 6lbs, she did admit to eating take out over the weekend. She also missed one night dose of meds including her Torsemide. She takes a metolazone tomorrow. Hopefully this will help- she denied shortness of breath or chest pain. No lower leg swelling noted. Lungs clear. I reviewed meds and filled pill box for one week. Refills noted called into [facility]. She received her Rhonda [name]. She agreed to visit in one week and knows to reach out in the mean time if needed. HF education provided. She agreed. Home visit complete. I will see her in one week.    BP 118/70   Pulse 70   Resp 16   Wt 188 lb (85.3 kg)   SpO2 92%   BMI 36.72 kg/m  Weight yesterday-- didn't weigh Last visit weight-- 182lbs  Up 6lbs- tomorrow is metolazone day, she admits to eating take out over the weekend.    [name], EMT-Paramedic [phone]  ACTION: Home visit completed    Patient Care Team: Shon Hale, MD as PCP - General (Family Medicine) Burna Sis, LCSW as Social Worker (Licensed Clinical Social Worker) [name], Noberto Retort, DO as Consulting Physician (Neurology) [name], Paramedic as Paramedic Chanetta Marshall, Meridee Score, MD as Attending  Physician (Family Medicine) Clinton Gallant, RN as [facility] Care Management Bensimhon, Bevelyn Buckles, MD as Consulting Physician (Cardiology)  Patient Active Problem List   Diagnosis Date Noted   Major depressive disorder, recurrent episode, severe 07/27/2022   MDD (major depressive disorder), recurrent severe, without psychosis 07/27/2022   MDD (major depressive disorder) 07/27/2022   Elevated troponin 12/02/2021   Nausea & vomiting 12/01/2021   Acute left ankle pain 10/11/2021   Intractable nausea and vomiting 10/08/2021   AKI (acute kidney injury) 10/08/2021   Hyponatremia 10/08/2021   Decreased estrogen level 08/01/2021   Dementia 08/01/2021   Frail elderly 08/01/2021   Hyperparathyroidism 08/01/2021   Spondylolisthesis 08/01/2021   Varicose veins of bilateral lower extremities with other complications 08/01/2021   Lumbago without sciatica 04/30/2021   Abnormal gait 06/11/2020   Adult failure to thrive syndrome 06/11/2020   Allergic rhinitis 06/11/2020   Anxiety 06/11/2020   Asthma 06/11/2020   Benign intracranial hypertension 06/11/2020   Body mass index (BMI) 45.0-49.9, adult 06/11/2020   Bowel incontinence 06/11/2020   Cholelithiasis 06/11/2020   Chronic sinusitis 06/11/2020   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   Daytime somnolence 06/11/2020   Diarrhea of presumed infectious origin 06/11/2020   Edema 06/11/2020   Family history of malignant neoplasm of gastrointestinal tract 06/11/2020   Gout 06/11/2020   History of fall 06/11/2020   History of infectious disease 06/11/2020   Insomnia 06/11/2020  Irregular bowel habits 06/11/2020   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   Lumbar spondylosis with myelopathy 06/11/2020   Macrocytosis 06/11/2020   Mild recurrent major depression 06/11/2020   Personal history of colonic polyps 06/11/2020   Personal history of malignant neoplasm of breast 06/11/2020   Repeated falls 06/11/2020   Mixed anxiety and depressive disorder  06/11/2020   Irritable bowel syndrome 01/04/2020   Spinal stenosis of lumbar region 01/03/2020   Other symptoms and signs involving the musculoskeletal system 09/11/2019   Bilateral leg weakness 08/01/2019   Leg swelling 08/01/2019   Diabetes mellitus with neuropathy 06/15/2019   Hypokalemia 11/08/2018   CKD (chronic kidney disease) stage 3, GFR 30-59 ml/min 11/08/2018   Orthostatic hypotension 07/28/2017   SVD (spontaneous vaginal delivery)    Peripheral neuropathy    On home oxygen therapy    Migraines    Left bundle branch block    Hypothyroidism    Hypertension    Hyperlipidemia    Heart murmur    GERD (gastroesophageal reflux disease)    Exertional shortness of breath    Major depressive disorder    Back pain    Arthritis    Generalized anxiety disorder    Anemia    Chronic respiratory failure 09/14/2013   Biventricular ICD (implantable cardioverter-defibrillator) in place 08/04/2013   Morbid obesity 11/01/2012   Chronic systolic heart failure 10/27/2012   Endometrial polyp 01/20/2012   Malignant tumor of breast 03/26/2011   Unspecified vitamin D deficiency 03/26/2011    Current Outpatient Medications:    acetaZOLAMIDE (DIAMOX) 125 MG tablet, Take 1 tablet (125 mg total) by mouth daily., Disp: 90 tablet, Rfl: 3   Ascorbic Acid (VITAMIN C) 1000 MG tablet, Take 1,000 mg by mouth daily., Disp: , Rfl:    buPROPion (WELLBUTRIN XL) 150 MG 24 hr tablet, Take 150 mg by mouth daily., Disp: , Rfl:    carvedilol (COREG) 3.125 MG tablet, TAKE 1 TABLET (3.125 MG) BY MOUTH TWICE DAILY WITH [name] (Patient taking differently: Take 3.125 mg by mouth 2 (two) times daily with a meal. TAKE 1 TABLET (3.125 MG) BY MOUTH TWICE DAILY WITH [name]), Disp: 180 tablet, Rfl: 3   citalopram (CELEXA) 20 MG tablet, Take 20 mg by mouth daily., Disp: , Rfl:    cyanocobalamin (VITAMIN B12) 1000 MCG tablet, Take 1,000 mcg by mouth daily., Disp: , Rfl:    dicyclomine (BENTYL) 20 MG tablet, Take 20 mg by  mouth 3 (three) times daily before [name]., Disp: , Rfl:    fluticasone (FLONASE) 50 MCG/ACT nasal spray, Place 1 spray into both nostrils daily., Disp: , Rfl:    gabapentin (NEURONTIN) 100 MG capsule, Take 1 capsule (100 mg total) by mouth 2 (two) times daily., Disp: 180 capsule, Rfl: 3   levothyroxine (SYNTHROID) 88 MCG tablet, TAKE 1 TABLET(88 MCG) BY MOUTH DAILY BEFORE BREAKFAST (Patient taking differently: Take 88 mcg by mouth daily before breakfast. TAKE 1 TABLET(88 MCG) BY MOUTH DAILY BEFORE BREAKFAST), Disp: 90 tablet, Rfl: 3   memantine (NAMENDA) 5 MG tablet, Take 1 tablet (5 mg total) by mouth daily., Disp: 30 tablet, Rfl: 3   metolazone (ZAROXOLYN) 2.5 MG tablet, TAKE 1 TABLET BY MOUTH EVERY OTHER TUESDAY. (Patient taking differently: Take 2.5 mg by mouth. Take 1 tablet by mouth every other Tuesday.), Disp: 12 tablet, Rfl: 3   midodrine (PROAMATINE) 10 MG tablet, TAKE 1 TABLET THREE TIMES DAILY WITH [name] (Patient taking differently: Take 10 mg by mouth 3 (three) times daily. TAKE 1 TABLET  THREE TIMES DAILY WITH [name]), Disp: 270 tablet, Rfl: 3   Multiple Vitamin (MULTIVITAMIN WITH MINERALS) TABS tablet, Take 1 tablet by mouth daily., Disp: , Rfl:    nystatin (MYCOSTATIN) 100000 UNIT/ML suspension, Take 5 mLs by mouth 4 (four) times daily., Disp: , Rfl:    ondansetron (ZOFRAN) 4 MG tablet, Take 1 tablet (4 mg total) by mouth every 6 (six) hours., Disp: 15 tablet, Rfl: 0   pantoprazole (PROTONIX) 40 MG tablet, Take 40 mg by mouth daily as needed (for acid reflux)., Disp: , Rfl:    potassium chloride SA (KLOR-CON M) 20 MEQ tablet, TAKE 2 TABLETS THREE TIMES DAILY. TAKE AN EXTRA 2 TABLETS ON DAYS YOU TAKE METOLAZONE (Patient taking differently: Take 40 mEq by mouth 3 (three) times daily.), Disp: 572 tablet, Rfl: 3   QUEtiapine (SEROQUEL) 50 MG tablet, Take 1 tablet (50 mg total) by mouth at bedtime., Disp: 30 tablet, Rfl: 3   risperiDONE (RISPERDAL) 0.5 MG tablet, Take 1 tablet (0.5 mg total) by  mouth 2 (two) times daily at 8 am and 4 pm., Disp: 30 tablet, Rfl: 3   simvastatin (ZOCOR) 10 MG tablet, TAKE 1 TABLET EVERY EVENING, Disp: 90 tablet, Rfl: 3   spironolactone (ALDACTONE) 25 MG tablet, TAKE 1 TABLET (25 MG TOTAL) BY MOUTH EVERY EVENING., Disp: 90 tablet, Rfl: 0   tiZANidine (ZANAFLEX) 2 MG tablet, Take 1 tablet (2 mg total) by mouth at bedtime as needed for muscle spasms., Disp: 90 tablet, Rfl: 1   torsemide (DEMADEX) 20 MG tablet, Take 4 tablets (80 mg total) by mouth 2 (two) times daily., Disp: 240 tablet, Rfl: 6   traZODone (DESYREL) 100 MG tablet, Take 1 tablet (100 mg total) by mouth at bedtime as needed for sleep., Disp: 30 tablet, Rfl: 3   clonazePAM (KLONOPIN) 0.5 MG tablet, Take 0.5 mg by mouth 2 (two) times daily as needed for anxiety. (Patient not taking: Reported on 08/31/2022), Disp: , Rfl:  Allergies  Allergen Reactions   Ceftin Anaphylaxis    Face and throat swell    Cefuroxime Axetil Anaphylaxis    Face and throat swell   Geodon [Ziprasidone Hcl] Hives   Lisinopril Other (See Comments)    angioedema   Cefuroxime     Other reaction(s): anaphylaxis   Sulfacetamide Sodium-Sulfur     Other reaction(s): itch   Allopurinol Nausea Only and Other (See Comments)    weakness   Ativan [Lorazepam] Itching   Sulfa Antibiotics Itching   Valium [Diazepam] Other (See Comments)    Patient states that diazepam doesn't relax, it has the opposite effect.     Social History   Socioeconomic History   Marital status: Married    Spouse name: Not on file   Number of children: 2   Years of education: 5318   Highest education level: Master's degree (e.g., MA, MS, MEng, MEd, MSW, MBA)  Occupational History   Occupation: retired  Tobacco Use   Smoking status: Some Days    Packs/day: 0.10    Years: 26.00    Additional pack years: 0.00    Total pack years: 2.60    Types: Cigarettes    Last attempt to quit: 03/23/2021    Years since quitting: 1.4   Smokeless tobacco: Never   Vaping Use   Vaping Use: Never used  Substance and Sexual Activity   Alcohol use: No   Drug use: No   Sexual activity: Yes  Other Topics Concern   Not on file  Social History Narrative   Tobacco Use Cigarettes: Former Smoker, Quit in 2008   No Alcohol   No recreational drug use   Diet: Regular/Low Carb   Exercise: None   Occupation: disabled   Education: Company secretary, masters   Children: 2   Firearms: No   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      Social Determinants of Health   Financial Resource Strain: Not on file  Food Insecurity: No Food Insecurity (08/11/2022)   Hunger Vital Sign    Worried About Running Out of Food in the Last Year: Never true    Ran Out of Food in the Last Year: Never true  Transportation Needs: No Transportation Needs (08/11/2022)   PRAPARE - Administrator, Civil Service (Medical): No    Lack of Transportation (Non-Medical): No  Physical Activity: Not on file  Stress: No Stress Concern Present (08/11/2022)   [name] of Occupational Health - Occupational Stress Questionnaire    Feeling of Stress : Only a little  Social Connections: Not on file  Intimate Partner Violence: Not At Risk (07/27/2022)   Humiliation, Afraid, Rape, and Kick questionnaire    Fear of Current or Ex-Partner: No    Emotionally Abused: No    Physically Abused: No    Sexually Abused: No    Physical Exam      Future Appointments  Date Time Provider Department Center  09/04/2022 10:30 AM Clinton Gallant, RN [facility] None  09/09/2022  3:15 PM Freddie Breech, DPM [facility]  09/14/2022  7:10 AM [facility] DEVICE REMOTES [facility]  09/15/2022  7:00 AM [facility] DEVICE REMOTES [facility]  09/17/2022  2:40 PM Arfeen, Phillips Grout, MD [facility] None  09/22/2022  2:00 PM Consuello Masse, RN [facility] None  12/14/2022  7:10 AM [facility] DEVICE REMOTES [facility]   03/15/2023  7:10 AM [facility] DEVICE REMOTES [facility]  06/02/2023  1:00 PM Rosann Auerbach, PhD [facility] None  06/14/2023  7:10 AM [facility] DEVICE REMOTES [facility]  06/29/2023  1:30 PM Nita Sickle K, DO [facility] None  09/13/2023  7:10 AM [facility] DEVICE REMOTES [facility]

## 2022-09-03 ENCOUNTER — Ambulatory Visit
Admission: EM | Admit: 2022-09-03 | Discharge: 2022-09-03 | Disposition: A | Discharge status: Home / Self Care | Payer: Medicare HMO | Attending: Physician Assistant | Admitting: Physician Assistant

## 2022-09-03 DIAGNOSIS — G609 Hereditary and idiopathic neuropathy, unspecified: Secondary | ICD-10-CM | POA: Diagnosis not present

## 2022-09-03 MED ORDER — HYDROCODONE-ACETAMINOPHEN 5-325 MG PO TABS: 1.0000 | ORAL_TABLET | Freq: Four times a day (QID) | ORAL | 0 refills | Status: DC | PRN

## 2022-09-03 NOTE — Discharge Instructions (Addendum)
See your Physician for recheck.  Take pain medication only as needed.

## 2022-09-03 NOTE — ED Triage Notes (Signed)
Pt presents with c/o gout to the left foot. Pt states she has had neuropathy before. Pt was on gabapentin and then was taken off of it. Pt states her feet have been swollen for a while. States she has soaked it and nothing has helped.

## 2022-09-03 NOTE — ED Provider Notes (Signed)
[facility]    CSN: [id number] Arrival date & time: 09/03/22  1502      History   Chief Complaint No chief complaint on file.   HPI Rhonda [name] is a 72 y.o. female.   Patient complains of pain in bilateral feet patient has been diagnosed with diabetic neuropathy.  Patient also reports that she has a history of gout in her left foot.  Patient reports that she is on gabapentin her dosage was decreased by her physician.  Patient has taken Tylenol at home for discomfort.  Patient reports her current pain is unrelieved by her medications.  The history is provided by the patient. No language interpreter was used.    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    Complication of anesthesia    History of low blood pressure after surgery; attributed to lying flat   Diabetes mellitus    "diet controlled" (05/03/2013)   Exertional shortness of breath    Generalized anxiety disorder    GERD (gastroesophageal reflux disease)    Gout    Heart murmur    Hyperlipidemia    Hypertension    Hypokalemia 11/08/2018   Hypothyroidism    Left bundle branch block    s/p CRT-D (04/2013)   Major depressive disorder    Migraines    On home oxygen therapy    "2L suppose to be q night" (05/03/2013)   Orthostatic hypotension 07/28/2017   Peripheral neuropathy    Feet   SVD (spontaneous vaginal delivery)    x 2   Unspecified vitamin D deficiency 03/26/2011   Does not take meds    Patient Active Problem List   Diagnosis Date Noted    Major depressive disorder, recurrent episode, severe 07/27/2022   MDD (major depressive disorder), recurrent severe, without psychosis 07/27/2022   MDD (major depressive disorder) 07/27/2022   Elevated troponin 12/02/2021   Nausea & vomiting 12/01/2021   Acute left ankle pain 10/11/2021   Intractable nausea and vomiting 10/08/2021   AKI (acute kidney injury) 10/08/2021   Hyponatremia 10/08/2021   Decreased estrogen level 08/01/2021   Dementia 08/01/2021   Frail elderly 08/01/2021   Hyperparathyroidism 08/01/2021   Spondylolisthesis 08/01/2021   Varicose veins of bilateral lower extremities with other complications 08/01/2021   Lumbago without sciatica 04/30/2021   Abnormal gait 06/11/2020   Adult failure to thrive syndrome 06/11/2020   Allergic rhinitis 06/11/2020   Anxiety 06/11/2020   Asthma 06/11/2020   Benign intracranial hypertension 06/11/2020   Body mass index (BMI) 45.0-49.9, adult 06/11/2020   Bowel incontinence 06/11/2020   Cholelithiasis 06/11/2020   Chronic sinusitis 06/11/2020   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   Daytime somnolence 06/11/2020   Diarrhea of presumed infectious origin 06/11/2020   Edema 06/11/2020   Family history of malignant neoplasm of gastrointestinal tract 06/11/2020   Gout 06/11/2020   History of fall 06/11/2020   History of infectious disease 06/11/2020   Insomnia 06/11/2020  Irregular bowel habits 06/11/2020   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   Lumbar spondylosis with myelopathy 06/11/2020   Macrocytosis 06/11/2020   Mild recurrent major depression 06/11/2020   Personal history of colonic polyps 06/11/2020   Personal history of malignant neoplasm of breast 06/11/2020   Repeated falls 06/11/2020   Mixed anxiety and depressive disorder 06/11/2020   Irritable bowel syndrome 01/04/2020   Spinal stenosis of lumbar region 01/03/2020   Other symptoms and signs involving the musculoskeletal system 09/11/2019   Bilateral leg weakness 08/01/2019   Leg  swelling 08/01/2019   Diabetes mellitus with neuropathy 06/15/2019   Hypokalemia 11/08/2018   CKD (chronic kidney disease) stage 3, GFR 30-59 ml/min 11/08/2018   Orthostatic hypotension 07/28/2017   SVD (spontaneous vaginal delivery)    Peripheral neuropathy    On home oxygen therapy    Migraines    Left bundle branch block    Hypothyroidism    Hypertension    Hyperlipidemia    Heart murmur    GERD (gastroesophageal reflux disease)    Exertional shortness of breath    Major depressive disorder    Back pain    Arthritis    Generalized anxiety disorder    Anemia    Chronic respiratory failure 09/14/2013   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/04/2013   Morbid obesity 11/01/2012   Chronic systolic heart failure 10/27/2012   Endometrial polyp 01/20/2012   Malignant tumor of breast 03/26/2011   Unspecified vitamin D deficiency 03/26/2011    Past Surgical History:  Procedure Laterality Date   BI-VENTRICULAR IMPLANTABLE CARDIOVERTER DEFIBRILLATOR N/A 05/03/2013   Procedure: BI-VENTRICULAR IMPLANTABLE CARDIOVERTER DEFIBRILLATOR  (CRT-D);  Surgeon: Marinus Maw, MD;  Location: [facility] CATH LAB;  Service: Cardiovascular;  Laterality: N/A;   BI-VENTRICULAR IMPLANTABLE CARDIOVERTER DEFIBRILLATOR  (CRT-D)  05/03/2013   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   BIOPSY  05/31/2018   Procedure: BIOPSY;  Surgeon: Kerin Salen, MD;  Location: [facility];  Service: Gastroenterology;;   BIV ICD GENERATOR CHANGEOUT N/A 04/11/2020   Procedure: BIV ICD GENERATOR CHANGEOUT;  Surgeon: Marinus Maw, MD;  Location: [facility] INVASIVE CV LAB;  Service: Cardiovascular;  Laterality: N/A;   BREAST LUMPECTOMY Left 07/28/2010   CARDIAC CATHETERIZATION  2010   NORMAL CORONARY ARTERIES   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--11 SURGERIES     PT HAS REMOVABLE SPEECH BULB-TAKES IT OUT BEFORE HER SURGERY   COLONOSCOPY     COLONOSCOPY N/A 05/31/2018   Procedure:  COLONOSCOPY;  Surgeon: Kerin Salen, MD;  Location: [facility] ENDOSCOPY;  Service: Gastroenterology;  Laterality: N/A;   HYSTEROSCOPY WITH D & C  01/20/2012   Procedure: DILATATION AND CURETTAGE /HYSTEROSCOPY;  Surgeon: Meriel Pica, MD;  Location: [facility] ORS;  Service: Gynecology;  Laterality: N/A;  with trueclear   PORT-A-CATH REMOVAL  09/23/2011   Procedure: REMOVAL PORT-A-CATH;  Surgeon: Emelia Loron, MD;  Location: [facility] ORS;  Service: General;  Laterality: N/A;  Port Removal   PORTACATH PLACEMENT  2012   TONSILLECTOMY  1960's   TOTAL KNEE ARTHROPLASTY  05/10/2012   Procedure: TOTAL KNEE ARTHROPLASTY;  Surgeon: Shelda Pal, MD;  Location: [facility] ORS;  Service: Orthopedics;  Laterality: Right;   WISDOM TOOTH EXTRACTION      OB History   No obstetric history on file.      Home Medications    Prior to Admission medications   Medication Sig Start Date End Date Taking? Authorizing Provider  HYDROcodone-acetaminophen (NORCO/VICODIN) 5-325 MG tablet Take  1 tablet by mouth every 6 (six) hours as needed for moderate pain. 09/03/22 09/03/23 Yes Cheron Schaumann K, PA-C  acetaZOLAMIDE (DIAMOX) 125 MG tablet Take 1 tablet (125 mg total) by mouth daily. 02/27/21   Nita Sickle K, DO  Ascorbic Acid (VITAMIN C) 1000 MG tablet Take 1,000 mg by mouth daily.    [provider]  buPROPion (WELLBUTRIN XL) 150 MG 24 hr tablet Take 150 mg by mouth daily.    [provider]  carvedilol (COREG) 3.125 MG tablet TAKE 1 TABLET (3.125 MG) BY MOUTH TWICE DAILY WITH MEALS Patient taking differently: Take 3.125 mg by mouth 2 (two) times daily with a meal. TAKE 1 TABLET (3.125 MG) BY MOUTH TWICE DAILY WITH MEALS 07/10/22   Andrey Farmer, PA-C  citalopram (CELEXA) 20 MG tablet Take 20 mg by mouth daily.    [provider]  clonazePAM (KLONOPIN) 0.5 MG tablet Take 0.5 mg by mouth 2 (two) times daily as needed for anxiety. Patient not taking: Reported on 08/31/2022 10/15/21   [provider]  cyanocobalamin (VITAMIN B12) 1000 MCG tablet Take 1,000 mcg by mouth daily.    [provider]  dicyclomine (BENTYL) 20 MG tablet Take 20 mg by mouth 3 (three) times daily before meals.    [provider]  fluticasone (FLONASE) 50 MCG/ACT nasal spray Place 1 spray into both nostrils daily.    [provider]  gabapentin (NEURONTIN) 100 MG capsule Take 1 capsule (100 mg total) by mouth 2 (two) times daily. 06/15/22   Nita Sickle K, DO  levothyroxine (SYNTHROID) 88 MCG tablet TAKE 1 TABLET(88 MCG) BY MOUTH DAILY BEFORE BREAKFAST Patient taking differently: Take 88 mcg by mouth daily before breakfast. TAKE 1 TABLET(88 MCG) BY MOUTH DAILY BEFORE BREAKFAST 07/08/20   Carlus Pavlov, MD  memantine (NAMENDA) 5 MG tablet Take 1 tablet (5 mg total) by mouth daily. 08/07/22   Sarina Ill, DO  metolazone (ZAROXOLYN) 2.5 MG tablet TAKE 1 TABLET BY MOUTH EVERY OTHER TUESDAY. Patient taking differently: Take 2.5 mg by mouth. Take 1 tablet by mouth every other Tuesday. 07/10/22   Andrey Farmer, PA-C  midodrine (PROAMATINE) 10 MG tablet TAKE 1 TABLET THREE TIMES DAILY WITH MEALS Patient taking differently: Take 10 mg by mouth 3 (three) times daily. TAKE 1 TABLET THREE TIMES DAILY WITH MEALS 06/22/22   Andrey Farmer, PA-C  Multiple Vitamin (MULTIVITAMIN WITH MINERALS) TABS tablet Take 1 tablet by mouth daily.    [provider]  nystatin (MYCOSTATIN) 100000 UNIT/ML suspension Take 5 mLs by mouth 4 (four) times daily. 07/23/22   [provider]  ondansetron (ZOFRAN) 4 MG tablet Take 1 tablet (4 mg total) by mouth every 6 (six) hours. 06/25/22   Prosperi, Christian H, PA-C  pantoprazole (PROTONIX) 40 MG tablet Take 40 mg by mouth daily as needed (for acid reflux). 10/13/21   [provider]  potassium chloride SA (KLOR-CON M) 20 MEQ tablet TAKE 2 TABLETS THREE TIMES DAILY. TAKE AN EXTRA 2 TABLETS ON DAYS YOU TAKE METOLAZONE Patient  taking differently: Take 40 mEq by mouth 3 (three) times daily. 07/15/22   Andrey Farmer, PA-C  QUEtiapine (SEROQUEL) 50 MG tablet Take 1 tablet (50 mg total) by mouth at bedtime. 08/06/22   Sarina Ill, DO  risperiDONE (RISPERDAL) 0.5 MG tablet Take 1 tablet (0.5 mg total) by mouth 2 (two) times daily at 8 am and 4 pm. 08/06/22   Sarina Ill, DO  simvastatin (ZOCOR) 10 MG tablet TAKE 1 TABLET EVERY EVENING 08/24/22   Andrey FarmerFinch, Lindsay Nicole, PA-C  spironolactone (ALDACTONE) 25 MG tablet TAKE 1 TABLET (25 MG TOTAL) BY MOUTH EVERY EVENING. 04/13/22   Bensimhon, Bevelyn Bucklesaniel R, MD  tiZANidine (ZANAFLEX) 2 MG tablet Take 1 tablet (2 mg total) by mouth at bedtime as needed for muscle spasms. 06/15/22   Nita SicklePatel, Donika K, DO  torsemide (DEMADEX) 20 MG tablet Take 4 tablets (80 mg total) by mouth 2 (two) times daily. 07/08/22   Jacklynn GanongMilford, Jessica M, FNP  traZODone (DESYREL) 100 MG tablet Take 1 tablet (100 mg total) by mouth at bedtime as needed for sleep. 08/06/22   Sarina IllHerrick, Richard Edward, DO    Family History Family History  Problem Relation Age of Onset   Alzheimer's disease Mother    CVA Mother    Hypertension Mother    Cancer - Colon Father        late 7960   Birth defects Paternal Uncle     Social History Social History   Tobacco Use   Smoking status: Some Days    Packs/day: 0.10    Years: 26.00    Additional pack years: 0.00    Total pack years: 2.60    Types: Cigarettes    Last attempt to quit: 03/23/2021    Years since quitting: 1.4   Smokeless tobacco: Never  Vaping Use   Vaping Use: Never used  Substance Use Topics   Alcohol use: No   Drug use: No     Allergies   Ceftin, Cefuroxime axetil, Geodon [ziprasidone hcl], Lisinopril, Cefuroxime, Sulfacetamide sodium-sulfur, Allopurinol, Ativan [lorazepam], Sulfa antibiotics, and Valium [diazepam]   Review of Systems Review of Systems  Musculoskeletal:  Positive for back pain.  All other systems reviewed and  are negative.    Physical Exam Triage Vital Signs ED Triage Vitals  Enc Vitals Group     BP 09/03/22 1628 112/72     Pulse Rate 09/03/22 1628 96     Resp 09/03/22 1628 18     Temp --      Temp src --      SpO2 09/03/22 1628 97 %     Weight --      Height --      Head Circumference --      Peak Flow --      Pain Score 09/03/22 1627 10     Pain Loc --      Pain Edu? --      Excl. in GC? --    No data found.  Updated Vital Signs BP 112/72   Pulse 96   Resp 18   SpO2 97%   Visual Acuity Right Eye Distance:   Left Eye Distance:   Bilateral Distance:    Right Eye Near:   Left Eye Near:    Bilateral Near:     Physical Exam Vitals and nursing note reviewed.  Constitutional:      Appearance: She is well-developed.  HENT:     Head: Normocephalic.  Cardiovascular:     Rate and Rhythm: Normal rate.  Pulmonary:     Effort: Pulmonary effort is normal.  Abdominal:     General: There is no distension.  Musculoskeletal:        General: Normal range of motion.     Cervical back: Normal range of motion.  Skin:    General: Skin is warm.  Neurological:     General: No focal deficit present.  Mental Status: She is alert and oriented to person, place, and time.  Psychiatric:        Mood and Affect: Mood normal.      UC Treatments / Results  Labs (all labs ordered are listed, but only abnormal results are displayed) Labs Reviewed - No data to display  EKG   Radiology No results found.  Procedures Procedures (including critical care time)  Medications Ordered in UC Medications - No data to display  Initial Impression / Assessment and Plan / UC Course  I have reviewed the triage vital signs and the nursing notes.  Pertinent labs & imaging results that were available during my care of the patient were reviewed by me and considered in my medical decision making (see chart for details).    Patient's primary care notes and cardiology notes reviewed.  I  will give patient a small amount of hydrocodone.  Patient is advised that she needs to follow-up with her primary care physician for ongoing pain management.  Final Clinical Impressions(s) / UC Diagnoses   Final diagnoses:  Hereditary and idiopathic peripheral neuropathy     Discharge Instructions      See your Physician for recheck.  Take pain medication only as needed.    ED Prescriptions     Medication Sig Dispense Auth. Provider   HYDROcodone-acetaminophen (NORCO/VICODIN) 5-325 MG tablet Take 1 tablet by mouth every 6 (six) hours as needed for moderate pain. 20 tablet Elson Areas, [place]      I have reviewed the PDMP during this encounter. An After Visit Summary was printed and given to the patient.       Elson Areas, [place] 09/03/22 1842

## 2022-09-04 ENCOUNTER — Ambulatory Visit: Payer: Self-pay | Admitting: *Deleted

## 2022-09-04 NOTE — Patient Instructions (Signed)
Visit Information  Thank you for taking time to visit with me today. Please don't hesitate to contact me if I can be of assistance to you.   Following are the goals we discussed today:   Goals Addressed             This Visit's Progress    [facility] care coordination post hospital services       Interventions Today    Flowsheet Row Most Recent Value  Chronic Disease   Chronic disease during today's visit Diabetes, Other  [bilateral pain of feet pain level 9-10 constant, hx gout and neuropathy Not monitoring blood sugars at home]  General Interventions   General Interventions Discussed/Reviewed Communication with  Labs Hgb A1c every 6 months  Durable Medical Equipment (DME) Other, Glucomoter  [cane used, does not have a glucometer at home]  Communication with Arts development officer a voice message for Dr Kirtland Bouchard Timberlake's RN related to worsening neuropathy symptoms]  Exercise Interventions   Exercise Discussed/Reviewed Exercise Discussed, Physical Activity  Physical Activity Discussed/Reviewed Physical Activity Discussed  [Encouraged movement of her extremities for circulation]  Education Interventions   Education Provided Provided Education  Provided Verbal Education On Foot Care, Blood Sugar Monitoring, Labs, Medication  Labs Reviewed Hgb A1c  Pharmacy Interventions   Pharmacy Dicussed/Reviewed Pharmacy Topics Reviewed, Medications and their functions  Advanced Directive Interventions   Advanced Directives Discussed/Reviewed Advanced Directives Discussed, Advanced Care Planning              Our next appointment is by telephone on 09/11/22 at 1015  Please call the care guide team at [phone] if you need to cancel or reschedule your appointment.   If you are experiencing a Mental Health or Behavioral Health Crisis or need someone to talk to, please call the Suicide and Crisis Lifeline: 988 call the [place] National Suicide Prevention Lifeline: 907-523-8843 or TTY: (867)398-0129 TTY  319-542-9001) to talk to a trained counselor call 1-800-273-TALK (toll free, 24 hour hotline) go to [facility] [address] [place] [phone]) call 911   The patient verbalized understanding of instructions, educational materials, and care plan provided today and DECLINED offer to receive copy of patient instructions, educational materials, and care plan.   The patient has been provided with contact information for the care management team and has been advised to call with any health related questions or concerns.   Carlie Solorzano L. Noelle Penner, RN, BSN, CCM [facility] Care Management Community Coordinator Office number [phone]

## 2022-09-04 NOTE — Patient Outreach (Signed)
  Care Coordination   Follow Up Visit Note   09/04/2022 Name: Rhonda [name] MRN: [id number] DOB: [birth year]  Rhonda [name] is a 72 y.o. year old female who sees Chanetta Marshall, Meridee Score, MD for primary care. I spoke with  [name] by phone today.  What matters to the patients health and wellness today?  09/03/22 urgent care visit for bilateral pain in feet. Today Left foot swollen, not red but hurts under foot and in the arch Throbbing pain not resolved with Neurontin, Tylenol, foot soaks in epsom x 2  History of diabetic neuropathy and history of gout in her left foot. Gout medicine not noted on her medication list. She reports her gabapentin dosage was decreased by her physician. .  Ambulating using a cane, has minimal movement & has a boot to stabilize the foot Encouraged movement of foot for circulation Pain level at a ten and interventions used so far has only reduced the pain level to a 9   Confirms not having tests for nerve pain   Diabetes cbgs are not monitored at home  Last HgA1c was 7 on 10/09/21     Goals Addressed             This Visit's Progress    [facility] care coordination post hospital services       Interventions Today    Flowsheet Row Most Recent Value  Chronic Disease   Chronic disease during today's visit Diabetes, Other  [bilateral pain of feet pain level 9-10 constant, hx gout and neuropathy Not monitoring blood sugars at home]  General Interventions   General Interventions Discussed/Reviewed Communication with  Labs Hgb A1c every 6 months  Durable Medical Equipment (DME) Other, Glucomoter  [cane used, does not have a glucometer at home]  Communication with Arts development officer a voice message for Dr Kirtland Bouchard Timberlake's RN related to worsening neuropathy symptoms]  Exercise Interventions   Exercise Discussed/Reviewed Exercise Discussed, Physical Activity  Physical Activity Discussed/Reviewed Physical Activity Discussed  [Encouraged movement of her extremities for  circulation]  Education Interventions   Education Provided Provided Education  Provided Verbal Education On Foot Care, Blood Sugar Monitoring, Labs, Medication  Labs Reviewed Hgb A1c  Pharmacy Interventions   Pharmacy Dicussed/Reviewed Pharmacy Topics Reviewed, Medications and their functions  Advanced Directive Interventions   Advanced Directives Discussed/Reviewed Advanced Directives Discussed, Advanced Care Planning              SDOH assessments and interventions completed:  No     Care Coordination Interventions:  Yes, provided   Follow up plan: Follow up call scheduled for 09/11/22    Encounter Outcome:  Pt. Visit Completed    Rozelia Catapano L. Noelle Penner, RN, BSN, CCM [facility] Care Management Community Coordinator Office number [phone]

## 2022-09-04 NOTE — Patient Outreach (Signed)
  Care Coordination   Follow Up Visit Note   09/04/2022 Name: Rhonda [name] MRN: [id number] DOB: [birth year]  Rhonda [name] is a 72 y.o. year old female who sees Chanetta Marshall, Meridee Score, MD for primary care. I spoke with  Mr Arley Phenix of [name] by phone today.  What matters to the patients health and wellness today?  Updated him on pending return call from pcp office/RN about treatment plan for pain in her feet He agrees to a follow up next week     Goals Addressed             This Visit's Progress    [facility] care coordination post hospital services       Interventions Today    Flowsheet Row Most Recent Value  Chronic Disease   Chronic disease during today's visit Diabetes, Other  [bilateral pain of feet pain level 9-10 constant, hx gout and neuropathy Not monitoring blood sugars at home]  General Interventions   General Interventions Discussed/Reviewed Communication with  Labs Hgb A1c every 6 months  Durable Medical Equipment (DME) Other, Glucomoter  [cane used, does not have a glucometer at home]  Communication with Arts development officer a voice message for Dr Kirtland Bouchard Timberlake's RN related to worsening neuropathy symptoms]  Exercise Interventions   Exercise Discussed/Reviewed Exercise Discussed, Physical Activity  Physical Activity Discussed/Reviewed Physical Activity Discussed  [Encouraged movement of her extremities for circulation]  Education Interventions   Education Provided Provided Education  Provided Verbal Education On Foot Care, Blood Sugar Monitoring, Labs, Medication  Labs Reviewed Hgb A1c  Pharmacy Interventions   Pharmacy Dicussed/Reviewed Pharmacy Topics Reviewed, Medications and their functions  Advanced Directive Interventions   Advanced Directives Discussed/Reviewed Advanced Directives Discussed, Advanced Care Planning              SDOH assessments and interventions completed:  No     Care Coordination Interventions:  Yes, provided    Follow up plan:  Follow up call scheduled for 09/11/22    Encounter Outcome:  Pt. Visit Completed   Woodson Macha L. Noelle Penner, RN, BSN, CCM [facility] Care Management Community Coordinator Office number [phone]

## 2022-09-07 ENCOUNTER — Ambulatory Visit: Payer: Self-pay | Admitting: *Deleted

## 2022-09-07 ENCOUNTER — Other Ambulatory Visit (HOSPITAL_COMMUNITY): Payer: Self-pay

## 2022-09-07 NOTE — Patient Outreach (Signed)
  Care Coordination   09/07/2022 Name: Rhonda [name] MRN: [id number] DOB: Jul 12, 1950   Care Coordination Outreach Attempts:  A second unsuccessful outreach was attempted today to offer the patient with information about available care coordination services as a benefit of their health plan.     Left another message since 09/04/22 for patient. Message left for pcp triage nurse - Requesting assistance with plan of care for pain of feet   Follow Up Plan:  Additional outreach attempts will be made to offer the patient care coordination information and services.   Encounter Outcome:  No Answer   Care Coordination Interventions:  No, not indicated     Okley Magnussen L. Noelle Penner, RN, BSN, CCM [facility] Care Management Community Coordinator Office number [phone]

## 2022-09-07 NOTE — Progress Notes (Signed)
Paramedicine Encounter    Patient ID: Rhonda [name], female    DOB: [birth year], 72 y.o.   MRN: [id number]   Complaints- right foot pain  Assessment- Caox4, warm and dry, ambulatory with no shortness of breath, no dizziness, no chest pain, no weight gain or swelling.   Compliance with meds- no missed doses in one week   Pill box filled- for one week   Refills needed- Risperidone  Meds changes since last visit- none     Social changes- none   Arrived for home visit for Rhonda [name] where she reports to be feeling okay other than some right foot pain which is ongoing. She was seen in ER for same and given hydrocodone- she reports she isn't using it- only Tylenol. I tried to call her husband to inquire and he didn't answer. The bottle was not found in the home. She was walking on same fine without an abnormal gait. I obtained vitals and assessment as noted. I reviewed meds and filled one pill box. I reviewed and wrote down appointments. I reminded Rhonda [name] to be mindful of her salt and fluid in take. She agreed with same. She agreed to visit in one week. Home visit complete.   BP 110/70   Pulse 86   Resp 16   Wt 188 lb (85.3 kg)   SpO2 99%   BMI 36.72 kg/m  Weight yesterday-didn't weigh Last visit weight-- 188lbs   [name], EMT-Paramedic [phone]  ACTION: Home visit completed    Patient Care Team: Shon Hale, MD as PCP - General (Family Medicine) Burna Sis, LCSW as Social Worker (Licensed Clinical Social Worker) [name], Noberto Retort, DO as Consulting Physician (Neurology) [name], Paramedic as Paramedic Chanetta Marshall, Meridee Score, MD as Attending Physician (Family Medicine) Clinton Gallant, RN as [facility] Care Management Bensimhon, Bevelyn Buckles, MD as Consulting Physician (Cardiology)  Patient Active Problem List   Diagnosis Date Noted   Major depressive disorder, recurrent episode, severe 07/27/2022   MDD (major depressive  disorder), recurrent severe, without psychosis 07/27/2022   MDD (major depressive disorder) 07/27/2022   Elevated troponin 12/02/2021   Nausea & vomiting 12/01/2021   Acute left ankle pain 10/11/2021   Intractable nausea and vomiting 10/08/2021   AKI (acute kidney injury) 10/08/2021   Hyponatremia 10/08/2021   Decreased estrogen level 08/01/2021   Dementia 08/01/2021   Frail elderly 08/01/2021   Hyperparathyroidism 08/01/2021   Spondylolisthesis 08/01/2021   Varicose veins of bilateral lower extremities with other complications 08/01/2021   Lumbago without sciatica 04/30/2021   Abnormal gait 06/11/2020   Adult failure to thrive syndrome 06/11/2020   Allergic rhinitis 06/11/2020   Anxiety 06/11/2020   Asthma 06/11/2020   Benign intracranial hypertension 06/11/2020   Body mass index (BMI) 45.0-49.9, adult 06/11/2020   Bowel incontinence 06/11/2020   Cholelithiasis 06/11/2020   Chronic sinusitis 06/11/2020   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   Daytime somnolence 06/11/2020   Diarrhea of presumed infectious origin 06/11/2020   Edema 06/11/2020   Family history of malignant neoplasm of gastrointestinal tract 06/11/2020   Gout 06/11/2020   History of fall 06/11/2020   History of infectious disease 06/11/2020   Insomnia 06/11/2020   Irregular bowel habits 06/11/2020   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   Lumbar spondylosis with myelopathy 06/11/2020   Macrocytosis 06/11/2020   Mild recurrent major depression 06/11/2020   Personal history of colonic polyps 06/11/2020   Personal history of malignant neoplasm of breast 06/11/2020   Repeated falls 06/11/2020  Mixed anxiety and depressive disorder 06/11/2020   Irritable bowel syndrome 01/04/2020   Spinal stenosis of lumbar region 01/03/2020   Other symptoms and signs involving the musculoskeletal system 09/11/2019   Bilateral leg weakness 08/01/2019   Leg swelling 08/01/2019   Diabetes mellitus with neuropathy 06/15/2019   Hypokalemia  11/08/2018   CKD (chronic kidney disease) stage 3, GFR 30-59 ml/min 11/08/2018   Orthostatic hypotension 07/28/2017   SVD (spontaneous vaginal delivery)    Peripheral neuropathy    On home oxygen therapy    Migraines    Left bundle branch block    Hypothyroidism    Hypertension    Hyperlipidemia    Heart murmur    GERD (gastroesophageal reflux disease)    Exertional shortness of breath    Major depressive disorder    Back pain    Arthritis    Generalized anxiety disorder    Anemia    Chronic respiratory failure 09/14/2013   Biventricular ICD (implantable cardioverter-defibrillator) in place 08/04/2013   Morbid obesity 11/01/2012   Chronic systolic heart failure 10/27/2012   Endometrial polyp 01/20/2012   Malignant tumor of breast 03/26/2011   Unspecified vitamin D deficiency 03/26/2011    Current Outpatient Medications:    acetaZOLAMIDE (DIAMOX) 125 MG tablet, Take 1 tablet (125 mg total) by mouth daily., Disp: 90 tablet, Rfl: 3   Ascorbic Acid (VITAMIN C) 1000 MG tablet, Take 1,000 mg by mouth daily., Disp: , Rfl:    buPROPion (WELLBUTRIN XL) 150 MG 24 hr tablet, Take 150 mg by mouth daily., Disp: , Rfl:    carvedilol (COREG) 3.125 MG tablet, TAKE 1 TABLET (3.125 MG) BY MOUTH TWICE DAILY WITH MEALS (Patient taking differently: Take 3.125 mg by mouth 2 (two) times daily with a meal. TAKE 1 TABLET (3.125 MG) BY MOUTH TWICE DAILY WITH MEALS), Disp: 180 tablet, Rfl: 3   citalopram (CELEXA) 20 MG tablet, Take 20 mg by mouth daily., Disp: , Rfl:    cyanocobalamin (VITAMIN B12) 1000 MCG tablet, Take 1,000 mcg by mouth daily., Disp: , Rfl:    dicyclomine (BENTYL) 20 MG tablet, Take 20 mg by mouth 3 (three) times daily before meals., Disp: , Rfl:    fluticasone (FLONASE) 50 MCG/ACT nasal spray, Place 1 spray into both nostrils daily., Disp: , Rfl:    gabapentin (NEURONTIN) 100 MG capsule, Take 1 capsule (100 mg total) by mouth 2 (two) times daily., Disp: 180 capsule, Rfl: 3    levothyroxine (SYNTHROID) 88 MCG tablet, TAKE 1 TABLET(88 MCG) BY MOUTH DAILY BEFORE BREAKFAST (Patient taking differently: Take 88 mcg by mouth daily before breakfast. TAKE 1 TABLET(88 MCG) BY MOUTH DAILY BEFORE BREAKFAST), Disp: 90 tablet, Rfl: 3   memantine (NAMENDA) 5 MG tablet, Take 1 tablet (5 mg total) by mouth daily., Disp: 30 tablet, Rfl: 3   metolazone (ZAROXOLYN) 2.5 MG tablet, TAKE 1 TABLET BY MOUTH EVERY OTHER TUESDAY. (Patient taking differently: Take 2.5 mg by mouth. Take 1 tablet by mouth every other Tuesday.), Disp: 12 tablet, Rfl: 3   midodrine (PROAMATINE) 10 MG tablet, TAKE 1 TABLET THREE TIMES DAILY WITH MEALS (Patient taking differently: Take 10 mg by mouth 3 (three) times daily. TAKE 1 TABLET THREE TIMES DAILY WITH MEALS), Disp: 270 tablet, Rfl: 3   Multiple Vitamin (MULTIVITAMIN WITH MINERALS) TABS tablet, Take 1 tablet by mouth daily., Disp: , Rfl:    ondansetron (ZOFRAN) 4 MG tablet, Take 1 tablet (4 mg total) by mouth every 6 (six) hours., Disp: 15 tablet, Rfl:  0   pantoprazole (PROTONIX) 40 MG tablet, Take 40 mg by mouth daily as needed (for acid reflux)., Disp: , Rfl:    potassium chloride SA (KLOR-CON M) 20 MEQ tablet, TAKE 2 TABLETS THREE TIMES DAILY. TAKE AN EXTRA 2 TABLETS ON DAYS YOU TAKE METOLAZONE (Patient taking differently: Take 40 mEq by mouth 3 (three) times daily.), Disp: 572 tablet, Rfl: 3   QUEtiapine (SEROQUEL) 50 MG tablet, Take 1 tablet (50 mg total) by mouth at bedtime., Disp: 30 tablet, Rfl: 3   risperiDONE (RISPERDAL) 0.5 MG tablet, Take 1 tablet (0.5 mg total) by mouth 2 (two) times daily at 8 am and 4 pm., Disp: 30 tablet, Rfl: 3   simvastatin (ZOCOR) 10 MG tablet, TAKE 1 TABLET EVERY EVENING, Disp: 90 tablet, Rfl: 3   spironolactone (ALDACTONE) 25 MG tablet, TAKE 1 TABLET (25 MG TOTAL) BY MOUTH EVERY EVENING., Disp: 90 tablet, Rfl: 0   torsemide (DEMADEX) 20 MG tablet, Take 4 tablets (80 mg total) by mouth 2 (two) times daily., Disp: 240 tablet, Rfl:  6   traZODone (DESYREL) 100 MG tablet, Take 1 tablet (100 mg total) by mouth at bedtime as needed for sleep., Disp: 30 tablet, Rfl: 3   clonazePAM (KLONOPIN) 0.5 MG tablet, Take 0.5 mg by mouth 2 (two) times daily as needed for anxiety. (Patient not taking: Reported on 09/07/2022), Disp: , Rfl:    HYDROcodone-acetaminophen (NORCO/VICODIN) 5-325 MG tablet, Take 1 tablet by mouth every 6 (six) hours as needed for moderate pain. (Patient not taking: Reported on 09/07/2022), Disp: 20 tablet, Rfl: 0   nystatin (MYCOSTATIN) 100000 UNIT/ML suspension, Take 5 mLs by mouth 4 (four) times daily. (Patient not taking: Reported on 09/07/2022), Disp: , Rfl:    tiZANidine (ZANAFLEX) 2 MG tablet, Take 1 tablet (2 mg total) by mouth at bedtime as needed for muscle spasms. (Patient not taking: Reported on 09/07/2022), Disp: 90 tablet, Rfl: 1 Allergies  Allergen Reactions   Ceftin Anaphylaxis    Face and throat swell    Cefuroxime Axetil Anaphylaxis    Face and throat swell   Geodon [Ziprasidone Hcl] Hives   Lisinopril Other (See Comments)    angioedema   Cefuroxime     Other reaction(s): anaphylaxis   Sulfacetamide Sodium-Sulfur     Other reaction(s): itch   Allopurinol Nausea Only and Other (See Comments)    weakness   Ativan [Lorazepam] Itching   Sulfa Antibiotics Itching   Valium [Diazepam] Other (See Comments)    Patient states that diazepam doesn't relax, it has the opposite effect.     Social History   Socioeconomic History   Marital status: Married    Spouse name: Not on file   Number of children: 2   Years of education: 75   Highest education level: Master's degree (e.g., MA, MS, MEng, MEd, MSW, MBA)  Occupational History   Occupation: retired  Tobacco Use   Smoking status: Some Days    Packs/day: 0.10    Years: 26.00    Additional pack years: 0.00    Total pack years: 2.60    Types: Cigarettes    Last attempt to quit: 03/23/2021    Years since quitting: 1.4   Smokeless tobacco:  Never  Vaping Use   Vaping Use: Never used  Substance and Sexual Activity   Alcohol use: No   Drug use: No   Sexual activity: Yes  Other Topics Concern   Not on file  Social History Narrative   Tobacco Use Cigarettes:  Former Smoker, Quit in 2008   No Alcohol   No recreational drug use   Diet: Regular/Low Carb   Exercise: None   Occupation: disabled   Education: Company secretary, masters   Children: 2   Firearms: No   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      Social Determinants of Health   Financial Resource Strain: Not on file  Food Insecurity: No Food Insecurity (08/11/2022)   Hunger Vital Sign    Worried About Running Out of Food in the Last Year: Never true    Ran Out of Food in the Last Year: Never true  Transportation Needs: No Transportation Needs (08/11/2022)   PRAPARE - Administrator, Civil Service (Medical): No    Lack of Transportation (Non-Medical): No  Physical Activity: Not on file  Stress: No Stress Concern Present (08/11/2022)   [name] of Occupational Health - Occupational Stress Questionnaire    Feeling of Stress : Only a little  Social Connections: Not on file  Intimate Partner Violence: Not At Risk (07/27/2022)   Humiliation, Afraid, Rape, and Kick questionnaire    Fear of Current or Ex-Partner: No    Emotionally Abused: No    Physically Abused: No    Sexually Abused: No    Physical Exam      Future Appointments  Date Time Provider Department Center  09/09/2022  3:15 PM Freddie Breech, DPM [facility]  09/11/2022 10:15 AM Clinton Gallant, RN [facility] None  09/14/2022  7:10 AM [facility] DEVICE REMOTES [facility]  09/15/2022  7:00 AM [facility] DEVICE REMOTES [facility]  09/17/2022  2:40 PM Arfeen, Phillips Grout, MD [facility] None  09/22/2022  2:00 PM Consuello Masse, RN [facility] None  12/14/2022  7:10 AM [facility] DEVICE REMOTES [facility]   03/15/2023  7:10 AM [facility] DEVICE REMOTES [facility]  06/02/2023  1:00 PM Rosann Auerbach, PhD [facility] None  06/14/2023  7:10 AM [facility] DEVICE REMOTES [facility]  06/29/2023  1:30 PM Nita Sickle K, DO [facility] None  09/13/2023  7:10 AM [facility] DEVICE REMOTES [facility]

## 2022-09-09 ENCOUNTER — Encounter: Payer: Self-pay | Admitting: Podiatry

## 2022-09-09 ENCOUNTER — Ambulatory Visit: Payer: Medicare HMO | Admitting: Podiatry

## 2022-09-09 DIAGNOSIS — B351 Tinea unguium: Secondary | ICD-10-CM

## 2022-09-09 DIAGNOSIS — E1142 Type 2 diabetes mellitus with diabetic polyneuropathy: Secondary | ICD-10-CM | POA: Diagnosis not present

## 2022-09-09 NOTE — Progress Notes (Signed)
  Subjective:  Patient ID: Rhonda [name], female    DOB: Sep 24, 1950,  MRN: [id number]  NONI STONESIFER presents to clinic today for  cc of pain of both feet. Feet are sensitive to touch.  Patient states she has trimmed her toenails.   Chief Complaint  Patient presents with       [facility] PCP[facility] PCP VST-08/13/2022   PCP is Shon Hale, MD.  Allergies  Allergen Reactions   Ceftin Anaphylaxis    Face and throat swell    Cefuroxime Axetil Anaphylaxis    Face and throat swell   Geodon [Ziprasidone Hcl] Hives   Lisinopril Other (See Comments)    angioedema   Cefuroxime     Other reaction(s): anaphylaxis   Sulfacetamide Sodium-Sulfur     Other reaction(s): itch   Allopurinol Nausea Only and Other (See Comments)    weakness   Ativan [Lorazepam] Itching   Sulfa Antibiotics Itching   Valium [Diazepam] Other (See Comments)    Patient states that diazepam doesn't relax, it has the opposite effect.    Review of Systems: Negative except as noted in the HPI.  Objective:  There were no vitals filed for this visit. Rhonda [name] is a pleasant 72 y.o. female in NAD. AAO x 3.  Vascular Capillary refill time to digits immediate b/l. Faintly palpable pedal pulses b/l. Pedal hair absent. Lower extremity skin temperature gradient within normal limits.  Neurologic Pt has subjective symptoms of neuropathy. Protective sensation diminished with 10g monofilament b/l. Vibratory sensation diminished b/l. Clonus negative b/l.  Dermatologic Pedal skin with normal turgor, texture and tone bilaterally. No open wounds bilaterally. No interdigital macerations bilaterally. Toenails 1-5 b/l adequate length. No hyperkeratotic nor porokeratotic lesions present on today's visit. Pedal skin noted to be dry and flaky b/l lower extremities.  Orthopedic: Normal muscle strength 5/5 to all lower extremity muscle groups bilaterally. Decreased ankle joint ROM b/l. Pes planus deformity noted b/l.     Radiographs: None  Assessment/Plan: 1. Diabetic peripheral neuropathy associated with type 2 diabetes mellitus     -Consent given for treatment as described below: -Examined patient. -Discussed neuropathy. Recommended she see her Neurologist or pain specialist for follow up. -Patient/POA to call should there be question/concern in the interim.   No follow-ups on file.  Freddie Breech, DPM

## 2022-09-10 DIAGNOSIS — M79672 Pain in left foot: Secondary | ICD-10-CM | POA: Diagnosis not present

## 2022-09-10 DIAGNOSIS — N958 Other specified menopausal and perimenopausal disorders: Secondary | ICD-10-CM | POA: Diagnosis not present

## 2022-09-10 DIAGNOSIS — R29890 Loss of height: Secondary | ICD-10-CM | POA: Diagnosis not present

## 2022-09-10 DIAGNOSIS — Z01419 Encounter for gynecological examination (general) (routine) without abnormal findings: Secondary | ICD-10-CM | POA: Diagnosis not present

## 2022-09-10 DIAGNOSIS — E039 Hypothyroidism, unspecified: Secondary | ICD-10-CM | POA: Diagnosis not present

## 2022-09-10 DIAGNOSIS — M79671 Pain in right foot: Secondary | ICD-10-CM | POA: Diagnosis not present

## 2022-09-10 DIAGNOSIS — F17210 Nicotine dependence, cigarettes, uncomplicated: Secondary | ICD-10-CM | POA: Diagnosis not present

## 2022-09-10 DIAGNOSIS — Z79899 Other long term (current) drug therapy: Secondary | ICD-10-CM | POA: Diagnosis not present

## 2022-09-10 DIAGNOSIS — Z6835 Body mass index (BMI) 35.0-35.9, adult: Secondary | ICD-10-CM | POA: Diagnosis not present

## 2022-09-11 ENCOUNTER — Ambulatory Visit: Payer: Self-pay | Admitting: *Deleted

## 2022-09-11 NOTE — Patient Outreach (Signed)
  Care Coordination   Follow Up Visit Note   09/12/2022 Name: Rhonda [name] MRN: [id number] DOB: July 14, 1950  Rhonda [name] is a 72 y.o. year old female who sees Chanetta Marshall, Meridee Score, MD for primary care. I spoke with husband Ramon Dredge briefly and then  [name] by phone today.  What matters to the patients health and wellness today?  Patient with memory concerns Still have pain in right ankle foot & bottom of feet,  limping today  walking with a cane  Appetite good onychomycosis education as her MD stated during the last visit that this may be a cause for her pain- nail fungus causing thickened, brittle, crumbly, or ragged nails. She voiced some understanding of onychomycosis being some possible cause of her feet pain  It is noted that a podiatry appointment has been scheduled for 09/23/22 with Dr Nicholes Rough   Goals Addressed             This Visit's Progress    [facility] care coordination post hospital services   On track    Interventions Today    Flowsheet Row Most Recent Value  Chronic Disease   Chronic disease during today's visit Diabetes, Other  [diabetic neuropathy right foot, Memory issues, onychomysis]  General Interventions   General Interventions Discussed/Reviewed General Interventions Reviewed, Doctor Visits, Annual Foot Exam  [follow up MD visit for pain in feet, need for podiatry care for onychomycosis]  Labs Hgb A1c every 6 months  Doctor Visits Discussed/Reviewed Doctor Visits Reviewed, Specialist, PCP  [pcp & pending podiatry]  PCP/Specialist Visits Compliance with follow-up visit  Exercise Interventions   Physical Activity Discussed/Reviewed Physical Activity Reviewed  Education Interventions   Education Provided Provided Education  [onychomycosid]  Provided Verbal Education On Foot Care, Other, Medication  Labs Reviewed Hgb A1c  Mental Health Interventions   Mental Health Discussed/Reviewed Mental Health Reviewed, Coping Strategies  Pharmacy Interventions    Pharmacy Dicussed/Reviewed Pharmacy Topics Reviewed, Medications and their functions  Safety Interventions   Safety Discussed/Reviewed Safety Reviewed, Home Safety  Home Safety Assistive Devices  [encouraged to await husband arrival back home prior to attempting to shower]              SDOH assessments and interventions completed:  No     Care Coordination Interventions:  Yes, provided   Follow up plan: Follow up call scheduled for 09/25/22    Encounter Outcome:  Pt. Visit Completed    Elisabella Hacker L. Noelle Penner, RN, BSN, CCM [facility] Care Management Community Coordinator Office number [phone]

## 2022-09-12 NOTE — Patient Instructions (Signed)
Visit Information  Thank you for taking time to visit with me today. Please don't hesitate to contact me if I can be of assistance to you.   Following are the goals we discussed today:   Goals Addressed             This Visit's Progress    [facility] care coordination post hospital services   On track    Interventions Today    Flowsheet Row Most Recent Value  Chronic Disease   Chronic disease during today's visit Diabetes, Other  [diabetic neuropathy right foot, Memory issues, onychomysis]  General Interventions   General Interventions Discussed/Reviewed General Interventions Reviewed, Doctor Visits, Annual Foot Exam  [follow up MD visit for pain in feet, need for podiatry care for onychomycosis]  Labs Hgb A1c every 6 months  Doctor Visits Discussed/Reviewed Doctor Visits Reviewed, Specialist, PCP  [pcp & pending podiatry]  PCP/Specialist Visits Compliance with follow-up visit  Exercise Interventions   Physical Activity Discussed/Reviewed Physical Activity Reviewed  Education Interventions   Education Provided Provided Education  [onychomycosid]  Provided Verbal Education On Foot Care, Other, Medication  Labs Reviewed Hgb A1c  Mental Health Interventions   Mental Health Discussed/Reviewed Mental Health Reviewed, Coping Strategies  Pharmacy Interventions   Pharmacy Dicussed/Reviewed Pharmacy Topics Reviewed, Medications and their functions  Safety Interventions   Safety Discussed/Reviewed Safety Reviewed, Home Safety  Home Safety Assistive Devices  [encouraged to await husband arrival back home prior to attempting to shower]              Our next appointment is by telephone on 09/25/22 at 2:15 pm  Please call the care guide team at [phone] if you need to cancel or reschedule your appointment.   If you are experiencing a Mental Health or Behavioral Health Crisis or need someone to talk to, please call the Suicide and Crisis Lifeline: 988 call the [place] National Suicide  Prevention Lifeline: 432-299-5404 or TTY: 917-771-0532 TTY 619-147-4713) to talk to a trained counselor call 1-800-273-TALK (toll free, 24 hour hotline) go to [facility] [address], [place] [phone]) call 911   The patient verbalized understanding of instructions, educational materials, and care plan provided today and DECLINED offer to receive copy of patient instructions, educational materials, and care plan.   The patient has been provided with contact information for the care management team and has been advised to call with any health related questions or concerns.   Quinton Voth L. Noelle Penner, RN, BSN, CCM [facility] Care Management Community Coordinator Office number [phone]

## 2022-09-14 ENCOUNTER — Other Ambulatory Visit (HOSPITAL_COMMUNITY): Payer: Self-pay

## 2022-09-14 ENCOUNTER — Ambulatory Visit (INDEPENDENT_AMBULATORY_CARE_PROVIDER_SITE_OTHER): Payer: Medicare HMO

## 2022-09-14 DIAGNOSIS — I428 Other cardiomyopathies: Secondary | ICD-10-CM

## 2022-09-14 DIAGNOSIS — I5022 Chronic systolic (congestive) heart failure: Secondary | ICD-10-CM | POA: Diagnosis not present

## 2022-09-14 LAB — CUP PACEART REMOTE DEVICE CHECK
Battery Remaining Longevity: 61 mo
Battery Voltage: 2.98 V
Brady Statistic AP VP Percent: 0.05 %
Brady Statistic AP VS Percent: 0.01 %
Brady Statistic AS VP Percent: 98.83 %
Brady Statistic AS VS Percent: 1.11 %
Brady Statistic RA Percent Paced: 0.06 %
Brady Statistic RV Percent Paced: 98.27 %
Date Time Interrogation Session: 20240422022703
HighPow Impedance: 75 Ohm
Implantable Lead Connection Status: 753985
Implantable Lead Connection Status: 753985
Implantable Lead Connection Status: 753985
Implantable Lead Implant Date: 20141210
Implantable Lead Implant Date: 20141210
Implantable Lead Implant Date: 20141210
Implantable Lead Location: 753858
Implantable Lead Location: 753859
Implantable Lead Location: 753860
Implantable Lead Model: 4396
Implantable Lead Model: 5076
Implantable Lead Model: 6935
Implantable Pulse Generator Implant Date: 20211118
Lead Channel Impedance Value: 361 Ohm
Lead Channel Impedance Value: 475 Ohm
Lead Channel Impedance Value: 551 Ohm
Lead Channel Impedance Value: 551 Ohm
Lead Channel Impedance Value: 665 Ohm
Lead Channel Impedance Value: 817 Ohm
Lead Channel Pacing Threshold Amplitude: 0.75 V
Lead Channel Pacing Threshold Amplitude: 1 V
Lead Channel Pacing Threshold Amplitude: 1.375 V
Lead Channel Pacing Threshold Pulse Width: 0.4 ms
Lead Channel Pacing Threshold Pulse Width: 0.4 ms
Lead Channel Pacing Threshold Pulse Width: 0.4 ms
Lead Channel Sensing Intrinsic Amplitude: 2.875 mV
Lead Channel Sensing Intrinsic Amplitude: 2.875 mV
Lead Channel Sensing Intrinsic Amplitude: 22.625 mV
Lead Channel Sensing Intrinsic Amplitude: 22.625 mV
Lead Channel Setting Pacing Amplitude: 1.5 V
Lead Channel Setting Pacing Amplitude: 2 V
Lead Channel Setting Pacing Amplitude: 2.75 V
Lead Channel Setting Pacing Pulse Width: 0.4 ms
Lead Channel Setting Pacing Pulse Width: 0.4 ms
Lead Channel Setting Sensing Sensitivity: 0.3 mV
Zone Setting Status: 755011
Zone Setting Status: 755011

## 2022-09-15 ENCOUNTER — Ambulatory Visit: Payer: Medicare HMO | Attending: Internal Medicine

## 2022-09-15 DIAGNOSIS — Z95810 Presence of automatic (implantable) cardiac defibrillator: Secondary | ICD-10-CM

## 2022-09-15 DIAGNOSIS — I5022 Chronic systolic (congestive) heart failure: Secondary | ICD-10-CM | POA: Diagnosis not present

## 2022-09-15 NOTE — Progress Notes (Signed)
Paramedicine Encounter    Patient ID: Rhonda [name], female    DOB: [birth year], 72 y.o.   MRN: [id number]   Complaints- feeling tired and sleeping a lot   Assessment- CAOX4, warm and dry, ambulating without shortness of breath, dizziness, no chest pain, no swelling. Foot pain has improved this week. Lungs clear. Vitals obtained and as noted. Urinating often.  Compliance with meds- Two missed doses of noon spots over the last week.   Pill box filled- for one week.   Refills needed- none   Meds changes since last visit- none     Social changes- none   Arrived for home visit for Rhonda [name] who reports to be feeling well with no complaints other than feeling tired and sleeping a lot. She denied chest pain, shortness of breath, dizziness, swelling, pain. Lungs clear. No lower edema noted. We reviewed upcoming appointments. We reviewed medications and filled pill box for one week. Refills none. I reminded Rhonda [name] the importance of med compliance and sodium and fluid restrictions, she agreed. I plan to see Rhonda [name] in one week. She agreed. Home visit completed.   BP 112/74   Pulse 90   Resp 16   Wt 185 lb (83.9 kg)   SpO2 97%   BMI 36.13 kg/m  Weight yesterday-didn't weigh  Last visit weight-188lbs   [name], EMT-Paramedic [phone]  ACTION: Home visit completed    Patient Care Team: Shon Hale, MD as PCP - General (Family Medicine) Burna Sis, LCSW as Social Worker (Licensed Clinical Social Worker) [name], Noberto Retort, DO as Consulting Physician (Neurology) [name], Paramedic as Paramedic Chanetta Marshall, Meridee Score, MD as Attending Physician (Family Medicine) Clinton Gallant, RN as [facility] Care Management Bensimhon, Bevelyn Buckles, MD as Consulting Physician (Cardiology) Lolly Mustache Phillips Grout, MD as Consulting Physician (Psychiatry) Candelaria Stagers, DPM as Consulting Physician (Podiatry)  Patient Active Problem List   Diagnosis Date Noted    Major depressive disorder, recurrent episode, severe 07/27/2022   MDD (major depressive disorder), recurrent severe, without psychosis 07/27/2022   MDD (major depressive disorder) 07/27/2022   Elevated troponin 12/02/2021   Nausea & vomiting 12/01/2021   Acute left ankle pain 10/11/2021   Intractable nausea and vomiting 10/08/2021   AKI (acute kidney injury) 10/08/2021   Hyponatremia 10/08/2021   Decreased estrogen level 08/01/2021   Dementia 08/01/2021   Frail elderly 08/01/2021   Hyperparathyroidism 08/01/2021   Spondylolisthesis 08/01/2021   Varicose veins of bilateral lower extremities with other complications 08/01/2021   Lumbago without sciatica 04/30/2021   Abnormal gait 06/11/2020   Adult failure to thrive syndrome 06/11/2020   Allergic rhinitis 06/11/2020   Anxiety 06/11/2020   Asthma 06/11/2020   Benign intracranial hypertension 06/11/2020   Body mass index (BMI) 45.0-49.9, adult 06/11/2020   Bowel incontinence 06/11/2020   Cholelithiasis 06/11/2020   Chronic sinusitis 06/11/2020   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   Daytime somnolence 06/11/2020   Diarrhea of presumed infectious origin 06/11/2020   Edema 06/11/2020   Family history of malignant neoplasm of gastrointestinal tract 06/11/2020   Gout 06/11/2020   History of fall 06/11/2020   History of infectious disease 06/11/2020   Insomnia 06/11/2020   Irregular bowel habits 06/11/2020   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   Lumbar spondylosis with myelopathy 06/11/2020   Macrocytosis 06/11/2020   Mild recurrent major depression 06/11/2020   Personal history of colonic polyps 06/11/2020   Personal history of malignant neoplasm of breast 06/11/2020   Repeated falls 06/11/2020   Mixed  anxiety and depressive disorder 06/11/2020   Irritable bowel syndrome 01/04/2020   Spinal stenosis of lumbar region 01/03/2020   Other symptoms and signs involving the musculoskeletal system 09/11/2019   Bilateral leg weakness 08/01/2019   Leg  swelling 08/01/2019   Diabetes mellitus with neuropathy 06/15/2019   Hypokalemia 11/08/2018   CKD (chronic kidney disease) stage 3, GFR 30-59 ml/min 11/08/2018   Orthostatic hypotension 07/28/2017   SVD (spontaneous vaginal delivery)    Peripheral neuropathy    On home oxygen therapy    Migraines    Left bundle branch block    Hypothyroidism    Hypertension    Hyperlipidemia    Heart murmur    GERD (gastroesophageal reflux disease)    Exertional shortness of breath    Major depressive disorder    Back pain    Arthritis    Generalized anxiety disorder    Anemia    Chronic respiratory failure 09/14/2013   Biventricular ICD (implantable cardioverter-defibrillator) in place 08/04/2013   Morbid obesity 11/01/2012   Chronic systolic heart failure 10/27/2012   Endometrial polyp 01/20/2012   Malignant tumor of breast 03/26/2011   Unspecified vitamin D deficiency 03/26/2011    Current Outpatient Medications:    acetaZOLAMIDE (DIAMOX) 125 MG tablet, Take 1 tablet (125 mg total) by mouth daily., Disp: 90 tablet, Rfl: 3   Ascorbic Acid (VITAMIN C) 1000 MG tablet, Take 1,000 mg by mouth daily., Disp: , Rfl:    buPROPion (WELLBUTRIN XL) 150 MG 24 hr tablet, Take 150 mg by mouth daily., Disp: , Rfl:    carvedilol (COREG) 3.125 MG tablet, TAKE 1 TABLET (3.125 MG) BY MOUTH TWICE DAILY WITH MEALS (Patient taking differently: Take 3.125 mg by mouth 2 (two) times daily with a meal. TAKE 1 TABLET (3.125 MG) BY MOUTH TWICE DAILY WITH MEALS), Disp: 180 tablet, Rfl: 3   citalopram (CELEXA) 20 MG tablet, Take 20 mg by mouth daily., Disp: , Rfl:    cyanocobalamin (VITAMIN B12) 1000 MCG tablet, Take 1,000 mcg by mouth daily., Disp: , Rfl:    dicyclomine (BENTYL) 20 MG tablet, Take 20 mg by mouth 3 (three) times daily before meals., Disp: , Rfl:    fluticasone (FLONASE) 50 MCG/ACT nasal spray, Place 1 spray into both nostrils daily., Disp: , Rfl:    gabapentin (NEURONTIN) 100 MG capsule, Take 1 capsule  (100 mg total) by mouth 2 (two) times daily., Disp: 180 capsule, Rfl: 3   levothyroxine (SYNTHROID) 88 MCG tablet, TAKE 1 TABLET(88 MCG) BY MOUTH DAILY BEFORE BREAKFAST (Patient taking differently: Take 88 mcg by mouth daily before breakfast. TAKE 1 TABLET(88 MCG) BY MOUTH DAILY BEFORE BREAKFAST), Disp: 90 tablet, Rfl: 3   memantine (NAMENDA) 5 MG tablet, Take 1 tablet (5 mg total) by mouth daily., Disp: 30 tablet, Rfl: 3   metolazone (ZAROXOLYN) 2.5 MG tablet, TAKE 1 TABLET BY MOUTH EVERY OTHER TUESDAY. (Patient taking differently: Take 2.5 mg by mouth. Take 1 tablet by mouth every other Tuesday.), Disp: 12 tablet, Rfl: 3   midodrine (PROAMATINE) 10 MG tablet, TAKE 1 TABLET THREE TIMES DAILY WITH MEALS (Patient taking differently: Take 10 mg by mouth 3 (three) times daily. TAKE 1 TABLET THREE TIMES DAILY WITH MEALS), Disp: 270 tablet, Rfl: 3   Multiple Vitamin (MULTIVITAMIN WITH MINERALS) TABS tablet, Take 1 tablet by mouth daily., Disp: , Rfl:    pantoprazole (PROTONIX) 40 MG tablet, Take 40 mg by mouth daily as needed (for acid reflux)., Disp: , Rfl:  potassium chloride SA (KLOR-CON M) 20 MEQ tablet, TAKE 2 TABLETS THREE TIMES DAILY. TAKE AN EXTRA 2 TABLETS ON DAYS YOU TAKE METOLAZONE (Patient taking differently: Take 40 mEq by mouth 3 (three) times daily.), Disp: 572 tablet, Rfl: 3   QUEtiapine (SEROQUEL) 50 MG tablet, Take 1 tablet (50 mg total) by mouth at bedtime., Disp: 30 tablet, Rfl: 3   risperiDONE (RISPERDAL) 0.5 MG tablet, Take 1 tablet (0.5 mg total) by mouth 2 (two) times daily at 8 am and 4 pm., Disp: 30 tablet, Rfl: 3   simvastatin (ZOCOR) 10 MG tablet, TAKE 1 TABLET EVERY EVENING, Disp: 90 tablet, Rfl: 3   spironolactone (ALDACTONE) 25 MG tablet, TAKE 1 TABLET (25 MG TOTAL) BY MOUTH EVERY EVENING., Disp: 90 tablet, Rfl: 0   torsemide (DEMADEX) 20 MG tablet, Take 4 tablets (80 mg total) by mouth 2 (two) times daily., Disp: 240 tablet, Rfl: 6   traZODone (DESYREL) 100 MG tablet, Take 1  tablet (100 mg total) by mouth at bedtime as needed for sleep., Disp: 30 tablet, Rfl: 3   clonazePAM (KLONOPIN) 0.5 MG tablet, Take 0.5 mg by mouth 2 (two) times daily as needed for anxiety. (Patient not taking: Reported on 09/15/2022), Disp: , Rfl:    HYDROcodone-acetaminophen (NORCO/VICODIN) 5-325 MG tablet, Take 1 tablet by mouth every 6 (six) hours as needed for moderate pain. (Patient not taking: Reported on 09/15/2022), Disp: 20 tablet, Rfl: 0   nystatin (MYCOSTATIN) 100000 UNIT/ML suspension, Take 5 mLs by mouth 4 (four) times daily. (Patient not taking: Reported on 09/15/2022), Disp: , Rfl:    ondansetron (ZOFRAN) 4 MG tablet, Take 1 tablet (4 mg total) by mouth every 6 (six) hours. (Patient not taking: Reported on 09/15/2022), Disp: 15 tablet, Rfl: 0   tiZANidine (ZANAFLEX) 2 MG tablet, Take 1 tablet (2 mg total) by mouth at bedtime as needed for muscle spasms. (Patient not taking: Reported on 09/15/2022), Disp: 90 tablet, Rfl: 1 Allergies  Allergen Reactions   Ceftin Anaphylaxis    Face and throat swell    Cefuroxime Axetil Anaphylaxis    Face and throat swell   Geodon [Ziprasidone Hcl] Hives   Lisinopril Other (See Comments)    angioedema   Cefuroxime     Other reaction(s): anaphylaxis   Sulfacetamide Sodium-Sulfur     Other reaction(s): itch   Allopurinol Nausea Only and Other (See Comments)    weakness   Ativan [Lorazepam] Itching   Sulfa Antibiotics Itching   Valium [Diazepam] Other (See Comments)    Patient states that diazepam doesn't relax, it has the opposite effect.     Social History   Socioeconomic History   Marital status: Married    Spouse name: Not on file   Number of children: 2   Years of education: 83   Highest education level: Master's degree (e.g., MA, MS, MEng, MEd, MSW, MBA)  Occupational History   Occupation: retired  Tobacco Use   Smoking status: Some Days    Packs/day: 0.10    Years: 26.00    Additional pack years: 0.00    Total pack years: 2.60     Types: Cigarettes    Last attempt to quit: 03/23/2021    Years since quitting: 1.4   Smokeless tobacco: Never  Vaping Use   Vaping Use: Never used  Substance and Sexual Activity   Alcohol use: No   Drug use: No   Sexual activity: Yes  Other Topics Concern   Not on file  Social History Narrative  Tobacco Use Cigarettes: Former Smoker, Quit in 2008   No Alcohol   No recreational drug use   Diet: Regular/Low Carb   Exercise: None   Occupation: disabled   Education: Company secretary, masters   Children: 2   Firearms: No   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      Social Determinants of Health   Financial Resource Strain: Not on file  Food Insecurity: No Food Insecurity (08/11/2022)   Hunger Vital Sign    Worried About Running Out of Food in the Last Year: Never true    Ran Out of Food in the Last Year: Never true  Transportation Needs: No Transportation Needs (08/11/2022)   PRAPARE - Administrator, Civil Service (Medical): No    Lack of Transportation (Non-Medical): No  Physical Activity: Not on file  Stress: No Stress Concern Present (08/11/2022)   [name] of Occupational Health - Occupational Stress Questionnaire    Feeling of Stress : Only a little  Social Connections: Not on file  Intimate Partner Violence: Not At Risk (07/27/2022)   Humiliation, Afraid, Rape, and Kick questionnaire    Fear of Current or Ex-Partner: No    Emotionally Abused: No    Physically Abused: No    Sexually Abused: No    Physical Exam      Future Appointments  Date Time Provider Department Center  09/17/2022  2:40 PM Arfeen, Phillips Grout, MD [facility] None  09/22/2022  2:00 PM Consuello Masse, RN [facility] None  09/23/2022 10:15 AM Candelaria Stagers, DPM [facility]  09/25/2022  2:15 PM Clinton Gallant, RN [facility] None  12/14/2022  7:10 AM [facility] DEVICE REMOTES [facility]  02/03/2023 10:45 AM Freddie Breech,  DPM [facility]  03/15/2023  7:10 AM [facility] DEVICE REMOTES [facility]  06/02/2023  1:00 PM Rosann Auerbach, PhD [facility] None  06/14/2023  7:10 AM [facility] DEVICE REMOTES [facility]  06/29/2023  1:30 PM Nita Sickle K, DO [facility] None  09/13/2023  7:10 AM [facility] DEVICE REMOTES [facility]

## 2022-09-17 ENCOUNTER — Encounter (HOSPITAL_COMMUNITY): Payer: Self-pay | Admitting: Psychiatry

## 2022-09-17 ENCOUNTER — Ambulatory Visit (HOSPITAL_BASED_OUTPATIENT_CLINIC_OR_DEPARTMENT_OTHER): Payer: Medicare HMO | Admitting: Psychiatry

## 2022-09-17 VITALS — BP 130/88 | HR 90 | Ht 60.0 in | Wt 185.0 lb

## 2022-09-17 DIAGNOSIS — R4189 Other symptoms and signs involving cognitive functions and awareness: Secondary | ICD-10-CM | POA: Diagnosis not present

## 2022-09-17 DIAGNOSIS — F330 Major depressive disorder, recurrent, mild: Secondary | ICD-10-CM

## 2022-09-17 DIAGNOSIS — F411 Generalized anxiety disorder: Secondary | ICD-10-CM | POA: Diagnosis not present

## 2022-09-17 MED ORDER — CITALOPRAM HYDROBROMIDE 20 MG PO TABS: 20.0000 mg | ORAL_TABLET | Freq: Every day | ORAL | 0 refills | Status: DC

## 2022-09-17 MED ORDER — BUPROPION HCL ER (XL) 150 MG PO TB24: 150.0000 mg | ORAL_TABLET | Freq: Every day | ORAL | 0 refills | Status: DC

## 2022-09-17 MED ORDER — CLONAZEPAM 0.5 MG PO TABS: 0.5000 mg | ORAL_TABLET | Freq: Two times a day (BID) | ORAL | 0 refills | Status: DC
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
## 2022-09-18 NOTE — Progress Notes (Signed)
EPIC Encounter for ICM Monitoring  Patient Name: Rhonda [name] is a 72 y.o. female Date: 09/18/2022 Primary Care Physican: Shon Hale, MD Primary Cardiologist: Bensimhon Electrophysiologist: Rae Roam Pacing: 98.3%   06/01/2022 Weight: 193 lbs  07/08/2022 Weight: 183 lbs 08/10/2022 Weight: 185 lbs         Transmission reviewed.               Optivol thoracic impedance suggesting possible fluid accumulation starting 4/16 and returned to baseline normal 4/23 (after prescribed Metolazone every other week).      Prescribed:  Patient participating in paramedicine program to assist with meds. Torsemide 20 mg 4 tablets (80 mg total) by mouth twice a day.   Potassium 20 meq 2 tablets (40 mEq total) by mouth 3 (three) times daily.  Take extra 2 tablets on days you take Metolazone.   Metolazone 2.5mg  1 tablet every other Tuesday.  (dosage due 10/20/22) Spironolactone 25 mg take 1 tablet by mouth every evening.   Labs: 08/01/2022 Creatinine 1.47, BUN 42, Potassium 3.1, Sodium 136, GFR 38  07/26/2022 Creatinine 2.02, BUN 25, Potassium 4.6, Sodium 133, GFR 26  07/22/2022 Creatinine 1.57, BUN 20, Potassium 3.8, Sodium 136, GFR 35  07/14/2022 Creatinine 1.32, BUN 20, Potassium 4.2, Sodium 138, GFR 44 06/25/2022 Creatinine 2.19, BUN 29, Potassium 3.5, Sodium 134, GFR 24 A complete set of results can be found in Results Review.   Recommendations:  No changes   Follow-up plan: ICM clinic phone appointment on 10/21/2022.   91 day device clinic remote transmission 12/14/2022.     EP/Cardiology Office Visits:   Recall 07/06/2022 with Dr Gala Romney.  Recall 03/20/2023 with Dr Ladona Ridgel.     Copy of ICM check sent to Dr. Ladona Ridgel.   3 month ICM trend: 09/15/2022.    12-14 Month ICM trend:     Karie Soda, RN 09/18/2022 2:28 PM

## 2022-09-21 ENCOUNTER — Other Ambulatory Visit (HOSPITAL_COMMUNITY): Payer: Self-pay

## 2022-09-21 NOTE — Progress Notes (Signed)
Paramedicine Encounter    Patient ID: Rhonda [name], female    DOB: 1950/08/05, 72 y.o.   MRN: [id number]   Complaints- sleeping often, feeling depressed   Assessment- CAOx4, warm and dry seated in her living room, no complaints of chest pain, dizziness, shortness of breath, no lower leg swelling, lungs clear, weight is down 6lbs.   Compliance with meds- a few missed doses. Two noon, one evening.   Pill box filled- for one week   Refills needed- gabapentin, risperidone   Meds changes since last visit- stopped several medications per psychiatrist.   -zofran -pantoprazole -seroquel -tizanidine -trazodone    Social changes- none   Arrived for home visit for Rhonda [name] who was seated, alert and oriented reporting to be feeling okay just sleeping a lot and feeling depressed. She was seen by psych last week who stopped several medications. I reviewed meds and made changes in her pill box today. I also obtained vitals and assessment as noted. We reviewed same and education on HF med and diet compliance. I reviewed upcoming appointments and confirmed same writing them down on calendar. I plan to see Rhonda [name] in one week. She knows to reach out if any symptoms arise. Home visit complete.    BP 102/60   Pulse 89   Resp 16   Wt 179 lb (81.2 kg)   SpO2 97%   BMI 34.96 kg/m  Weight yesterday-didn't weigh  Last visit weight-- 184lbs    [name], EMT-Paramedic [phone]  ACTION: Home visit completed    Patient Care Team: Shon Hale, MD as PCP - General (Family Medicine) Burna Sis, LCSW as Social Worker (Licensed Clinical Social Worker) [name], Noberto Retort, DO as Consulting Physician (Neurology) [name], Paramedic as Paramedic Chanetta Marshall, Meridee Score, MD as Attending Physician (Family Medicine) Clinton Gallant, RN as [facility] Care Management Bensimhon, Bevelyn Buckles, MD as Consulting Physician (Cardiology) Lolly Mustache, Phillips Grout, MD as Consulting  Physician (Psychiatry) Candelaria Stagers, DPM as Consulting Physician (Podiatry)  Patient Active Problem List   Diagnosis Date Noted   Major depressive disorder, recurrent episode, severe (HCC) 07/27/2022   MDD (major depressive disorder), recurrent severe, without psychosis (HCC) 07/27/2022   MDD (major depressive disorder) 07/27/2022   Elevated troponin 12/02/2021   Nausea & vomiting 12/01/2021   Acute left ankle pain 10/11/2021   Intractable nausea and vomiting 10/08/2021   AKI (acute kidney injury) (HCC) 10/08/2021   Hyponatremia 10/08/2021   Decreased estrogen level 08/01/2021   Dementia (HCC) 08/01/2021   Frail elderly 08/01/2021   Hyperparathyroidism (HCC) 08/01/2021   Spondylolisthesis 08/01/2021   Varicose veins of bilateral lower extremities with other complications 08/01/2021   Lumbago without sciatica 04/30/2021   Abnormal gait 06/11/2020   Adult failure to thrive syndrome 06/11/2020   Allergic rhinitis 06/11/2020   Anxiety 06/11/2020   Asthma 06/11/2020   Benign intracranial hypertension 06/11/2020   Body mass index (BMI) 45.0-49.9, adult (HCC) 06/11/2020   Bowel incontinence 06/11/2020   Cholelithiasis 06/11/2020   Chronic sinusitis 06/11/2020   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   Daytime somnolence 06/11/2020   Diarrhea of presumed infectious origin 06/11/2020   Edema 06/11/2020   Family history of malignant neoplasm of gastrointestinal tract 06/11/2020   Gout 06/11/2020   History of fall 06/11/2020   History of infectious disease 06/11/2020   Insomnia 06/11/2020   Irregular bowel habits 06/11/2020   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   Lumbar spondylosis with myelopathy 06/11/2020   Macrocytosis 06/11/2020   Mild recurrent  major depression (HCC) 06/11/2020   Personal history of colonic polyps 06/11/2020   Personal history of malignant neoplasm of breast 06/11/2020   Repeated falls 06/11/2020   Mixed anxiety and depressive disorder 06/11/2020   Irritable bowel syndrome  01/04/2020   Spinal stenosis of lumbar region 01/03/2020   Other symptoms and signs involving the musculoskeletal system 09/11/2019   Bilateral leg weakness 08/01/2019   Leg swelling 08/01/2019   Diabetes mellitus with neuropathy (HCC) 06/15/2019   Hypokalemia 11/08/2018   CKD (chronic kidney disease) stage 3, GFR 30-59 ml/min (HCC) 11/08/2018   Orthostatic hypotension 07/28/2017   SVD (spontaneous vaginal delivery)    Peripheral neuropathy    On home oxygen therapy    Migraines    Left bundle branch block    Hypothyroidism    Hypertension    Hyperlipidemia    Heart murmur    GERD (gastroesophageal reflux disease)    Exertional shortness of breath    Major depressive disorder    Back pain    Arthritis    Generalized anxiety disorder    Anemia    Chronic respiratory failure (HCC) 09/14/2013   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/04/2013   Morbid obesity (HCC) 11/01/2012   Chronic systolic heart failure (HCC) 10/27/2012   Endometrial polyp 01/20/2012   Malignant tumor of breast (HCC) 03/26/2011   Unspecified vitamin D deficiency 03/26/2011    Current Outpatient Medications:    acetaZOLAMIDE (DIAMOX) 125 MG tablet, Take 1 tablet (125 mg total) by mouth daily., Disp: 90 tablet, Rfl: 3   Ascorbic Acid (VITAMIN C) 1000 MG tablet, Take 1,000 mg by mouth daily., Disp: , Rfl:    buPROPion (WELLBUTRIN XL) 150 MG 24 hr tablet, Take 1 tablet (150 mg total) by mouth daily., Disp: 30 tablet, Rfl: 0   carvedilol (COREG) 3.125 MG tablet, TAKE 1 TABLET (3.125 MG) BY MOUTH TWICE DAILY WITH MEALS (Patient taking differently: Take 3.125 mg by mouth 2 (two) times daily with a meal. TAKE 1 TABLET (3.125 MG) BY MOUTH TWICE DAILY WITH MEALS), Disp: 180 tablet, Rfl: 3   citalopram (CELEXA) 20 MG tablet, Take 1 tablet (20 mg total) by mouth daily., Disp: 30 tablet, Rfl: 0   clonazePAM (KLONOPIN) 0.5 MG tablet, Take 1 tablet (0.5 mg total) by mouth 2 (two) times daily.,  Disp: 60 tablet, Rfl: 0   cyanocobalamin (VITAMIN B12) 1000 MCG tablet, Take 1,000 mcg by mouth daily., Disp: , Rfl:    dicyclomine (BENTYL) 20 MG tablet, Take 20 mg by mouth 3 (three) times daily before meals., Disp: , Rfl:    fluticasone (FLONASE) 50 MCG/ACT nasal spray, Place 1 spray into both nostrils daily., Disp: , Rfl:    gabapentin (NEURONTIN) 100 MG capsule, Take 1 capsule (100 mg total) by mouth 2 (two) times daily., Disp: 180 capsule, Rfl: 3   levothyroxine (SYNTHROID) 88 MCG tablet, TAKE 1 TABLET(88 MCG) BY MOUTH DAILY BEFORE BREAKFAST (Patient taking differently: Take 88 mcg by mouth daily before breakfast. TAKE 1 TABLET(88 MCG) BY MOUTH DAILY BEFORE BREAKFAST), Disp: 90 tablet, Rfl: 3   memantine (NAMENDA) 5 MG tablet, Take 1 tablet (5 mg total) by mouth daily., Disp: 30 tablet, Rfl: 3   metolazone (ZAROXOLYN) 2.5 MG tablet, TAKE 1 TABLET BY MOUTH EVERY OTHER TUESDAY. (Patient taking differently: Take 2.5 mg by mouth. Take 1 tablet by mouth every other Tuesday.), Disp: 12 tablet, Rfl: 3   midodrine (PROAMATINE) 10 MG tablet, TAKE 1 TABLET THREE TIMES DAILY WITH MEALS (  Patient taking differently: Take 10 mg by mouth 3 (three) times daily. TAKE 1 TABLET THREE TIMES DAILY WITH MEALS), Disp: 270 tablet, Rfl: 3   Multiple Vitamin (MULTIVITAMIN WITH MINERALS) TABS tablet, Take 1 tablet by mouth daily., Disp: , Rfl:    potassium chloride SA (KLOR-CON M) 20 MEQ tablet, TAKE 2 TABLETS THREE TIMES DAILY. TAKE AN EXTRA 2 TABLETS ON DAYS YOU TAKE METOLAZONE (Patient taking differently: Take 40 mEq by mouth 3 (three) times daily.), Disp: 572 tablet, Rfl: 3   risperiDONE (RISPERDAL) 0.5 MG tablet, Take 1 tablet (0.5 mg total) by mouth 2 (two) times daily at 8 am and 4 pm., Disp: 30 tablet, Rfl: 3   simvastatin (ZOCOR) 10 MG tablet, TAKE 1 TABLET EVERY EVENING, Disp: 90 tablet, Rfl: 3   spironolactone (ALDACTONE) 25 MG tablet, TAKE 1 TABLET (25 MG TOTAL) BY MOUTH EVERY EVENING., Disp: 90 tablet, Rfl: 0    torsemide (DEMADEX) 20 MG tablet, Take 4 tablets (80 mg total) by mouth 2 (two) times daily., Disp: 240 tablet, Rfl: 6 Allergies  Allergen Reactions   Ceftin Anaphylaxis    Face and throat swell    Cefuroxime Axetil Anaphylaxis    Face and throat swell   Geodon [Ziprasidone Hcl] Hives   Lisinopril Other (See Comments)    angioedema   Cefuroxime     Other reaction(s): anaphylaxis   Sulfacetamide Sodium-Sulfur     Other reaction(s): itch   Allopurinol Nausea Only and Other (See Comments)    weakness   Ativan [Lorazepam] Itching   Sulfa Antibiotics Itching   Valium [Diazepam] Other (See Comments)    Patient states that diazepam doesn't relax, it has the opposite effect.     Social History   Socioeconomic History   Marital status: Married    Spouse name: Not on file   Number of children: 2   Years of education: 69   Highest education level: Master's degree (e.g., MA, MS, MEng, MEd, MSW, MBA)  Occupational History   Occupation: retired  Tobacco Use   Smoking status: Some Days    Packs/day: 0.10    Years: 26.00    Additional pack years: 0.00    Total pack years: 2.60    Types: Cigarettes    Last attempt to quit: 03/23/2021    Years since quitting: 1.4   Smokeless tobacco: Never  Vaping Use   Vaping Use: Never used  Substance and Sexual Activity   Alcohol use: No   Drug use: No   Sexual activity: Yes  Other Topics Concern   Not on file  Social History Narrative   Tobacco Use Cigarettes: Former Smoker, Quit in 2008   No Alcohol   No recreational drug use   Diet: Regular/Low Carb   Exercise: None   Occupation: disabled   Education: Company secretary, masters   Children: 2   Firearms: No   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      Social Determinants of Health   Financial Resource Strain: Not on file  Food Insecurity: No Food Insecurity (08/11/2022)   Hunger Vital Sign    Worried About Running Out of Food in the Last  Year: Never true    Ran Out of Food in the Last Year: Never true  Transportation Needs: No Transportation Needs (08/11/2022)   PRAPARE - Administrator, Civil Service (Medical): No    Lack of Transportation (Non-Medical): No  Physical Activity: Not on file  Stress: No Stress Concern Present (08/11/2022)   [name] of Occupational Health - Occupational Stress Questionnaire    Feeling of Stress : Only a little  Social Connections: Not on file  Intimate Partner Violence: Not At Risk (07/27/2022)   Humiliation, Afraid, Rape, and Kick questionnaire    Fear of Current or Ex-Partner: No    Emotionally Abused: No    Physically Abused: No    Sexually Abused: No    Physical Exam      Future Appointments  Date Time Provider Department Center  09/22/2022  2:00 PM Consuello Masse, RN [facility] None  09/23/2022 10:15 AM Candelaria Stagers, DPM [facility]  09/25/2022  2:15 PM Clinton Gallant, RN [facility] None  10/21/2022  7:05 AM [facility] DEVICE REMOTES [facility]  11/12/2022  4:00 PM Arfeen, Phillips Grout, MD [facility] None  12/14/2022  7:10 AM [facility] DEVICE REMOTES [facility]  02/03/2023 10:45 AM Freddie Breech, DPM [facility]  03/15/2023  7:10 AM [facility] DEVICE REMOTES [facility]  06/02/2023  1:00 PM Rosann Auerbach, PhD [facility] None  06/14/2023  7:10 AM [facility] DEVICE REMOTES [facility]  06/29/2023  1:30 PM Nita Sickle K, DO [facility] None  09/13/2023  7:10 AM [facility] DEVICE REMOTES [facility]

## 2022-09-22 ENCOUNTER — Other Ambulatory Visit: Payer: Medicare HMO

## 2022-09-22 VITALS — BP 124/68 | HR 94 | Temp 98.7°F

## 2022-09-22 DIAGNOSIS — Z515 Encounter for palliative care: Secondary | ICD-10-CM

## 2022-09-22 NOTE — Progress Notes (Signed)
PATIENT NAME: Rhonda [name] DOB: [birth year] MRN: [id number]  PRIMARY CARE PROVIDER: Shon Hale, MD  RESPONSIBLE PARTY:  Acct ID - Guarantor Home Phone Work Phone Relationship Acct Type  [id number] JESYCA, WOODFIN* [phone]  Self P/F     [address], [place], [place] [address]  Palliative Care Follow Up Encounter Note    LPN completed home visit      Respiratory: no issues at this time; gets SOB with ambulation and will rest when needed   Cardiac: CHF; no edema     Cognitive: alert, still ask the same question over and over; forgetful;    Appetite: now gets Meals for Mom again; reports she doesn't have a good appetite but will eat a few forks full of the meals she microwaves; drinks 4-6 cups daily; still has no appetite   GI/GU: has a BM every other day; no urinary issues today   Mobility: ambulates with a cane throughout the house and outside of the house; did not use cane when ambulating around the house today; when she ambulates for a long period she will stop and rest or sit until she feels rested enough to walk again   ADLs: able to complete ADL's independently; pt reports Lynford Humphrey comes twice a week to clean; fix her a meal; have Bible study and assist her as needed   Sleeping Pattern: sleeps through the night   Pain: 10/10 foot pain level   Wt: weighs 179lbs   Meds: Dr. Prescott Gum office rep Herbert Seta) comes to the home weekly to fill the pt's pill box   Neuropathy: still reports 10/10 pain level to the lateral side and bottom of R foot      Goals of Care: To stay in the home with her husband as long as possible     CODE STATUS: Full Code ADVANCED DIRECTIVES: Y MOST FORM: Y PPS: 70%   PHYSICAL EXAM:   VITALS: Today's Vitals   09/22/22 1426  BP: 124/68  Pulse: 94  Temp: 98.7 F (37.1 C)  TempSrc: Temporal  SpO2: 97%  PainSc: 10-Worst pain ever  PainLoc: Foot    LUNGS: clear to auscultation  CARDIAC: Cor RRR EXTREMITIES: neuropathic  pain to R foot SKIN: Skin color, texture, turgor normal. No rashes or lesions  NEURO: negative except for gait problems, memory problems, and weakness       Marquell Saenz Clementeen Graham, LPN

## 2022-09-23 ENCOUNTER — Ambulatory Visit: Payer: Medicare HMO | Admitting: Podiatry

## 2022-09-24 ENCOUNTER — Telehealth (HOSPITAL_COMMUNITY): Payer: Self-pay | Admitting: Licensed Clinical Social Worker

## 2022-09-24 NOTE — Telephone Encounter (Signed)
H&V Care Navigation CSW Progress Note  Clinical Social Worker received call from pt requesting more Moms Meals.  CSW checked with [facility] and pt has already received 2 months of meals in 2024 so is not eligible at this time.  CSW informed pt.   SDOH Screenings   Food Insecurity: No Food Insecurity (08/11/2022)  Housing: Low Risk  (08/11/2022)  Transportation Needs: No Transportation Needs (08/11/2022)  Utilities: Not At Risk (08/11/2022)  Alcohol Screen: Low Risk  (07/27/2022)  Depression (PHQ2-9): Low Risk  (08/11/2022)  Stress: No Stress Concern Present (08/11/2022)  Tobacco Use: High Risk (09/17/2022)    Burna Sis, LCSW Clinical Social Worker [facility] Desk#: [phone] Cell#: [phone]

## 2022-09-25 ENCOUNTER — Ambulatory Visit: Payer: Self-pay | Admitting: *Deleted

## 2022-09-25 NOTE — Patient Outreach (Signed)
  Care Coordination   Follow Up Visit Note   09/25/2022 updated entry for 09/25/22 Name: Rhonda [name] MRN: [id number] DOB: Oct 20, 1950  Rhonda [name] is a 72 y.o. year old female who sees Chanetta Marshall, Meridee Score, MD for primary care. I spoke with  [name] by phone today.  What matters to the patients health and wellness today?  Continues with foot pain, now taking more gabapentin To see podiatrist on 5/824 for an xray Spoke with patient at the home number  She confirms she has more pain of the left foot than the right one  She uses bedroom slippers at home but slides around in them Discussed home safety related to mobility  She confirms she has a cane but no wheelchair that might assist with preventing falls  Has not eating Just got out shower  [place] her DSS aide is visiting at 3 pm   Goals Addressed   None     SDOH assessments and interventions completed:  No     Care Coordination Interventions:  Yes, provided   Follow up plan: No further intervention required.   Encounter Outcome:  Pt. Visit Completed    Freeman Borba L. Noelle Penner, RN, BSN, CCM [facility] Care Management Community Coordinator Office number [phone]

## 2022-09-28 ENCOUNTER — Other Ambulatory Visit (HOSPITAL_COMMUNITY): Payer: Self-pay

## 2022-09-28 NOTE — Progress Notes (Signed)
Paramedicine Encounter    Patient ID: Rhonda [name], female    DOB: [birth year], 72 y.o.   MRN: [id number]   Arrived for home visit for Rhonda [name] who reports to be feeling okay other than her right foot pain- she follows up with foot and ankle on Wednesday. She is using shoe boot at present. I obtained vitals and assessment as noted. Lungs clear, no lower leg edema. She denied chest pain, dizziness, shortness of breath. I reviewed meds and confirmed same filling pill box for one week. Kerry Hough [facility] will follow up next week due to my absence. She agreed with plan.   I called in the following refills to [facility]: -Gabapentin -Risperidone -Spironolactone -Namenda   Appointments reviewed and confirmed. Home visit complete.   [name], EMT-Paramedic [phone] 09/28/2022     Patient Care Team: Shon Hale, MD as PCP - General (Family Medicine) Burna Sis, LCSW as Social Worker (Licensed Clinical Social Worker) [name], Noberto Retort, DO as Consulting Physician (Neurology) [name], Paramedic as Paramedic Chanetta Marshall, Meridee Score, MD as Attending Physician (Family Medicine) Clinton Gallant, RN as [facility] Care Management Bensimhon, Bevelyn Buckles, MD as Consulting Physician (Cardiology) Lolly Mustache Phillips Grout, MD as Consulting Physician (Psychiatry) Candelaria Stagers, DPM as Consulting Physician (Podiatry)  Patient Active Problem List   Diagnosis Date Noted   Major depressive disorder, recurrent episode, severe (HCC) 07/27/2022   MDD (major depressive disorder), recurrent severe, without psychosis (HCC) 07/27/2022   MDD (major depressive disorder) 07/27/2022   Elevated troponin 12/02/2021   Nausea & vomiting 12/01/2021   Acute left ankle pain 10/11/2021   Intractable nausea and vomiting 10/08/2021   AKI (acute kidney injury) (HCC) 10/08/2021   Hyponatremia 10/08/2021   Decreased estrogen level 08/01/2021   Dementia (HCC) 08/01/2021   Frail elderly 08/01/2021    Hyperparathyroidism (HCC) 08/01/2021   Spondylolisthesis 08/01/2021   Varicose veins of bilateral lower extremities with other complications 08/01/2021   Lumbago without sciatica 04/30/2021   Abnormal gait 06/11/2020   Adult failure to thrive syndrome 06/11/2020   Allergic rhinitis 06/11/2020   Anxiety 06/11/2020   Asthma 06/11/2020   Benign intracranial hypertension 06/11/2020   Body mass index (BMI) 45.0-49.9, adult (HCC) 06/11/2020   Bowel incontinence 06/11/2020   Cholelithiasis 06/11/2020   Chronic sinusitis 06/11/2020   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   Daytime somnolence 06/11/2020   Diarrhea of presumed infectious origin 06/11/2020   Edema 06/11/2020   Family history of malignant neoplasm of gastrointestinal tract 06/11/2020   Gout 06/11/2020   History of fall 06/11/2020   History of infectious disease 06/11/2020   Insomnia 06/11/2020   Irregular bowel habits 06/11/2020   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   Lumbar spondylosis with myelopathy 06/11/2020   Macrocytosis 06/11/2020   Mild recurrent major depression (HCC) 06/11/2020   Personal history of colonic polyps 06/11/2020   Personal history of malignant neoplasm of breast 06/11/2020   Repeated falls 06/11/2020   Mixed anxiety and depressive disorder 06/11/2020   Irritable bowel syndrome 01/04/2020   Spinal stenosis of lumbar region 01/03/2020   Other symptoms and signs involving the musculoskeletal system 09/11/2019   Bilateral leg weakness 08/01/2019   Leg swelling 08/01/2019   Diabetes mellitus with neuropathy (HCC) 06/15/2019   Hypokalemia 11/08/2018   CKD (chronic kidney disease) stage 3, GFR 30-59 ml/min (HCC) 11/08/2018   Orthostatic hypotension 07/28/2017   SVD (spontaneous vaginal delivery)    Peripheral neuropathy    On home oxygen therapy    Migraines  Left bundle branch block    Hypothyroidism    Hypertension    Hyperlipidemia    Heart murmur    GERD (gastroesophageal reflux disease)    Exertional  shortness of breath    Major depressive disorder    Back pain    Arthritis    Generalized anxiety disorder    Anemia    Chronic respiratory failure (HCC) 09/14/2013   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/04/2013   Morbid obesity (HCC) 11/01/2012   Chronic systolic heart failure (HCC) 10/27/2012   Endometrial polyp 01/20/2012   Malignant tumor of breast (HCC) 03/26/2011   Unspecified vitamin D deficiency 03/26/2011    Current Outpatient Medications:    acetaZOLAMIDE (DIAMOX) 125 MG tablet, Take 1 tablet (125 mg total) by mouth daily., Disp: 90 tablet, Rfl: 3   Ascorbic Acid (VITAMIN C) 1000 MG tablet, Take 1,000 mg by mouth daily., Disp: , Rfl:    buPROPion (WELLBUTRIN XL) 150 MG 24 hr tablet, Take 1 tablet (150 mg total) by mouth daily., Disp: 30 tablet, Rfl: 0   carvedilol (COREG) 3.125 MG tablet, TAKE 1 TABLET (3.125 MG) BY MOUTH TWICE DAILY WITH MEALS (Patient taking differently: Take 3.125 mg by mouth 2 (two) times daily with a meal. TAKE 1 TABLET (3.125 MG) BY MOUTH TWICE DAILY WITH MEALS), Disp: 180 tablet, Rfl: 3   citalopram (CELEXA) 20 MG tablet, Take 1 tablet (20 mg total) by mouth daily., Disp: 30 tablet, Rfl: 0   clonazePAM (KLONOPIN) 0.5 MG tablet, Take 1 tablet (0.5 mg total) by mouth 2 (two) times daily., Disp: 60 tablet, Rfl: 0   cyanocobalamin (VITAMIN B12) 1000 MCG tablet, Take 1,000 mcg by mouth daily., Disp: , Rfl:    dicyclomine (BENTYL) 20 MG tablet, Take 20 mg by mouth 3 (three) times daily before meals., Disp: , Rfl:    fluticasone (FLONASE) 50 MCG/ACT nasal spray, Place 1 spray into both nostrils daily., Disp: , Rfl:    gabapentin (NEURONTIN) 100 MG capsule, Take 1 capsule (100 mg total) by mouth 2 (two) times daily., Disp: 180 capsule, Rfl: 3   levothyroxine (SYNTHROID) 88 MCG tablet, TAKE 1 TABLET(88 MCG) BY MOUTH DAILY BEFORE BREAKFAST (Patient taking differently: Take 88 mcg by mouth daily before breakfast. TAKE 1 TABLET(88 MCG)  BY MOUTH DAILY BEFORE BREAKFAST), Disp: 90 tablet, Rfl: 3   memantine (NAMENDA) 5 MG tablet, Take 1 tablet (5 mg total) by mouth daily., Disp: 30 tablet, Rfl: 3   metolazone (ZAROXOLYN) 2.5 MG tablet, TAKE 1 TABLET BY MOUTH EVERY OTHER TUESDAY. (Patient taking differently: Take 2.5 mg by mouth. Take 1 tablet by mouth every other Tuesday.), Disp: 12 tablet, Rfl: 3   midodrine (PROAMATINE) 10 MG tablet, TAKE 1 TABLET THREE TIMES DAILY WITH MEALS (Patient taking differently: Take 10 mg by mouth 3 (three) times daily. TAKE 1 TABLET THREE TIMES DAILY WITH MEALS), Disp: 270 tablet, Rfl: 3   Multiple Vitamin (MULTIVITAMIN WITH MINERALS) TABS tablet, Take 1 tablet by mouth daily., Disp: , Rfl:    potassium chloride SA (KLOR-CON M) 20 MEQ tablet, TAKE 2 TABLETS THREE TIMES DAILY. TAKE AN EXTRA 2 TABLETS ON DAYS YOU TAKE METOLAZONE (Patient taking differently: Take 40 mEq by mouth 3 (three) times daily.), Disp: 572 tablet, Rfl: 3   risperiDONE (RISPERDAL) 0.5 MG tablet, Take 1 tablet (0.5 mg total) by mouth 2 (two) times daily at 8 am and 4 pm., Disp: 30 tablet, Rfl: 3   simvastatin (ZOCOR) 10 MG tablet, TAKE 1  TABLET EVERY EVENING, Disp: 90 tablet, Rfl: 3   spironolactone (ALDACTONE) 25 MG tablet, TAKE 1 TABLET (25 MG TOTAL) BY MOUTH EVERY EVENING., Disp: 90 tablet, Rfl: 0   torsemide (DEMADEX) 20 MG tablet, Take 4 tablets (80 mg total) by mouth 2 (two) times daily., Disp: 240 tablet, Rfl: 6 Allergies  Allergen Reactions   Ceftin Anaphylaxis    Face and throat swell    Cefuroxime Axetil Anaphylaxis    Face and throat swell   Geodon [Ziprasidone Hcl] Hives   Lisinopril Other (See Comments)    angioedema   Cefuroxime     Other reaction(s): anaphylaxis   Sulfacetamide Sodium-Sulfur     Other reaction(s): itch   Allopurinol Nausea Only and Other (See Comments)    weakness   Ativan [Lorazepam] Itching   Sulfa Antibiotics Itching   Valium [Diazepam] Other (See Comments)    Patient states that diazepam  doesn't relax, it has the opposite effect.     Social History   Socioeconomic History   Marital status: Married    Spouse name: Not on file   Number of children: 2   Years of education: 13   Highest education level: Master's degree (e.g., MA, MS, MEng, MEd, MSW, MBA)  Occupational History   Occupation: retired  Tobacco Use   Smoking status: Some Days    Packs/day: 0.10    Years: 26.00    Additional pack years: 0.00    Total pack years: 2.60    Types: Cigarettes    Last attempt to quit: 03/23/2021    Years since quitting: 1.5   Smokeless tobacco: Never  Vaping Use   Vaping Use: Never used  Substance and Sexual Activity   Alcohol use: No   Drug use: No   Sexual activity: Yes  Other Topics Concern   Not on file  Social History Narrative   Tobacco Use Cigarettes: Former Smoker, Quit in 2008   No Alcohol   No recreational drug use   Diet: Regular/Low Carb   Exercise: None   Occupation: disabled   Education: Company secretary, masters   Children: 2   Firearms: No   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      Social Determinants of Health   Financial Resource Strain: Not on file  Food Insecurity: No Food Insecurity (08/11/2022)   Hunger Vital Sign    Worried About Running Out of Food in the Last Year: Never true    Ran Out of Food in the Last Year: Never true  Transportation Needs: No Transportation Needs (08/11/2022)   PRAPARE - Administrator, Civil Service (Medical): No    Lack of Transportation (Non-Medical): No  Physical Activity: Not on file  Stress: No Stress Concern Present (08/11/2022)   [name] of Occupational Health - Occupational Stress Questionnaire    Feeling of Stress : Only a little  Social Connections: Not on file  Intimate Partner Violence: Not At Risk (07/27/2022)   Humiliation, Afraid, Rape, and Kick questionnaire    Fear of Current or Ex-Partner: No    Emotionally Abused: No     Physically Abused: No    Sexually Abused: No    Physical Exam      Future Appointments  Date Time Provider Department Center  09/30/2022  2:15 PM Candelaria Stagers, DPM [facility]  10/21/2022  7:05 AM [facility] DEVICE REMOTES [facility]  11/12/2022  4:00  PM Arfeen, Phillips Grout, MD [facility] None  12/14/2022  7:10 AM [facility] DEVICE REMOTES [facility]  02/03/2023 10:45 AM Freddie Breech, DPM [facility]  03/15/2023  7:10 AM [facility] DEVICE REMOTES [facility]  06/02/2023  1:00 PM Rosann Auerbach, PhD [facility] None  06/14/2023  7:10 AM [facility] DEVICE REMOTES [facility]  06/29/2023  1:30 PM Nita Sickle K, DO [facility] None  09/13/2023  7:10 AM [facility] DEVICE REMOTES [facility]     ACTION: Home visit completed

## 2022-09-29 ENCOUNTER — Other Ambulatory Visit (HOSPITAL_COMMUNITY): Payer: Self-pay | Admitting: Internal Medicine

## 2022-09-29 ENCOUNTER — Other Ambulatory Visit: Payer: Self-pay | Admitting: Neurology

## 2022-09-30 ENCOUNTER — Ambulatory Visit: Payer: Medicare HMO | Admitting: Podiatry

## 2022-09-30 DIAGNOSIS — M79672 Pain in left foot: Secondary | ICD-10-CM | POA: Diagnosis not present

## 2022-09-30 DIAGNOSIS — M10372 Gout due to renal impairment, left ankle and foot: Secondary | ICD-10-CM

## 2022-09-30 MED ORDER — METHYLPREDNISOLONE 4 MG PO TBPK: ORAL_TABLET | ORAL | 0 refills | Status: DC

## 2022-09-30 NOTE — Progress Notes (Signed)
Subjective:  Patient ID: Rhonda [name], female    DOB: [birth year],  MRN: [id number]  Chief Complaint  Patient presents with   Foot Pain    Pt stated that she does have gout and it goes from her hands to her feet she stated that she has tried soaking them in epsom salt which only helps for about 10-15 min and then the pain does come back     72 y.o. female presents with the above complaint. Patient presents with bilateral bilateral lesser joint lateral bilateral muscle joint pain gout flare.  Patient states that it was in both of the feet but now is going to the hands.  She states is generalized pain across metatarsal heads.  Pain scale 7 or 10 hurts with ambulation worse with pressure she has not seen anyone as prior to seeing me denies any other acute complaints.   Review of Systems: Negative except as noted in the HPI. Denies N/V/F/Ch.  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    Complication of anesthesia    History of low blood pressure after surgery; attributed to lying flat   Diabetes mellitus    "diet controlled" (05/03/2013)   Exertional shortness of breath    Generalized anxiety disorder    GERD (gastroesophageal reflux disease)    Gout    Heart murmur    Hyperlipidemia    Hypertension    Hypokalemia 11/08/2018   Hypothyroidism    Left bundle branch block    s/p CRT-D (04/2013)   Major depressive disorder    Migraines    On home oxygen therapy    "2L suppose to be q night" (05/03/2013)    Orthostatic hypotension 07/28/2017   Peripheral neuropathy    Feet   SVD (spontaneous vaginal delivery)    x 2   Unspecified vitamin D deficiency 03/26/2011   Does not take meds    Current Outpatient Medications:    methylPREDNISolone (MEDROL DOSEPAK) 4 MG TBPK tablet, Take as directed, Disp: 21 each, Rfl: 0   acetaZOLAMIDE (DIAMOX) 125 MG tablet, Take 1 tablet (125 mg total) by mouth daily., Disp: 90 tablet, Rfl: 3   Ascorbic Acid (VITAMIN C) 1000 MG tablet, Take 1,000 mg by mouth daily., Disp: , Rfl:    buPROPion (WELLBUTRIN XL) 150 MG 24 hr tablet, Take 1 tablet (150 mg total) by mouth daily., Disp: 30 tablet, Rfl: 0   carvedilol (COREG) 3.125 MG tablet, TAKE 1 TABLET (3.125 MG) BY MOUTH TWICE DAILY WITH MEALS (Patient taking differently: Take 3.125 mg by mouth 2 (two) times daily with a meal. TAKE 1 TABLET (3.125 MG) BY MOUTH TWICE DAILY WITH MEALS), Disp: 180 tablet, Rfl: 3   citalopram (CELEXA) 20 MG tablet, Take 1 tablet (20 mg total) by mouth daily., Disp: 30 tablet, Rfl: 0   clonazePAM (KLONOPIN) 0.5 MG tablet, Take 1 tablet (0.5 mg total) by mouth 2 (two) times daily., Disp:  60 tablet, Rfl: 0   cyanocobalamin (VITAMIN B12) 1000 MCG tablet, Take 1,000 mcg by mouth daily., Disp: , Rfl:    dicyclomine (BENTYL) 20 MG tablet, Take 20 mg by mouth 3 (three) times daily before meals., Disp: , Rfl:    fluticasone (FLONASE) 50 MCG/ACT nasal spray, Place 1 spray into both nostrils daily., Disp: , Rfl:    gabapentin (NEURONTIN) 100 MG capsule, Take 1 capsule (100 mg total) by mouth 2 (two) times daily., Disp: 180 capsule, Rfl: 3   levothyroxine (SYNTHROID) 88 MCG tablet, TAKE 1 TABLET(88 MCG) BY MOUTH DAILY BEFORE BREAKFAST (Patient taking differently: Take 88 mcg by mouth daily before breakfast. TAKE 1 TABLET(88 MCG) BY MOUTH DAILY BEFORE BREAKFAST), Disp: 90 tablet, Rfl: 3   memantine (NAMENDA) 5 MG tablet, Take 1 tablet (5 mg total) by mouth daily., Disp: 30 tablet, Rfl: 3   metolazone  (ZAROXOLYN) 2.5 MG tablet, TAKE 1 TABLET BY MOUTH EVERY OTHER TUESDAY. (Patient taking differently: Take 2.5 mg by mouth. Take 1 tablet by mouth every other Tuesday.), Disp: 12 tablet, Rfl: 3   midodrine (PROAMATINE) 10 MG tablet, TAKE 1 TABLET THREE TIMES DAILY WITH MEALS (Patient taking differently: Take 10 mg by mouth 3 (three) times daily. TAKE 1 TABLET THREE TIMES DAILY WITH MEALS), Disp: 270 tablet, Rfl: 3   Multiple Vitamin (MULTIVITAMIN WITH MINERALS) TABS tablet, Take 1 tablet by mouth daily., Disp: , Rfl:    potassium chloride SA (KLOR-CON M) 20 MEQ tablet, TAKE 2 TABLETS THREE TIMES DAILY. TAKE AN EXTRA 2 TABLETS ON DAYS YOU TAKE METOLAZONE (Patient taking differently: Take 40 mEq by mouth 3 (three) times daily.), Disp: 572 tablet, Rfl: 3   risperiDONE (RISPERDAL) 0.5 MG tablet, Take 1 tablet (0.5 mg total) by mouth 2 (two) times daily at 8 am and 4 pm., Disp: 30 tablet, Rfl: 3   simvastatin (ZOCOR) 10 MG tablet, TAKE 1 TABLET EVERY EVENING, Disp: 90 tablet, Rfl: 3   spironolactone (ALDACTONE) 25 MG tablet, TAKE 1 TABLET (25 MG TOTAL) BY MOUTH EVERY EVENING., Disp: 90 tablet, Rfl: 3   torsemide (DEMADEX) 20 MG tablet, Take 4 tablets (80 mg total) by mouth 2 (two) times daily., Disp: 240 tablet, Rfl: 6  Social History   Tobacco Use  Smoking Status Some Days   Packs/day: 0.10   Years: 26.00   Additional pack years: 0.00   Total pack years: 2.60   Types: Cigarettes   Last attempt to quit: 03/23/2021   Years since quitting: 1.5  Smokeless Tobacco Never    Allergies  Allergen Reactions   Ceftin Anaphylaxis    Face and throat swell    Cefuroxime Axetil Anaphylaxis    Face and throat swell   Geodon [Ziprasidone Hcl] Hives   Lisinopril Other (See Comments)    angioedema   Cefuroxime     Other reaction(s): anaphylaxis   Sulfacetamide Sodium-Sulfur     Other reaction(s): itch   Allopurinol Nausea Only and Other (See Comments)    weakness   Ativan [Lorazepam] Itching   Sulfa  Antibiotics Itching   Valium [Diazepam] Other (See Comments)    Patient states that diazepam doesn't relax, it has the opposite effect.   Objective:  There were no vitals filed for this visit. There is no height or weight on file to calculate BMI. Constitutional Well developed. Well nourished.  Vascular Dorsalis pedis pulses palpable bilaterally. Posterior tibial pulses palpable bilaterally. Capillary refill normal to all digits.  No cyanosis or clubbing noted. Pedal  hair growth normal.  Neurologic Normal speech. Oriented to person, place, and time. Epicritic sensation to light touch grossly present bilaterally.  Dermatologic Nails well groomed and normal in appearance. No open wounds. No skin lesions.  Orthopedic: Generalized pain noted across the ball of the foot.  Pain primarily focus of fourth metatarsophalangeal joint bilaterally.  No redness or erythema noted.   Radiographs: None Assessment:   1. Acute gout due to renal impairment involving toe of left foot    Plan:  Patient was evaluated and treated and all questions answered.  Bilateral generalized metatarsophalangeal joint pain of the fourth with underlying history of gout -All questions and concerns were discussed with the patient in extensive detail.  Patient may be experiencing gout flare even though the clinical picture does not correlate completely with gout.  She may be an under 6 secretory of purines as opposed to over producer.  I discussed with her that she will benefit from Medrol Dosepak to allow for pain to be localized.  She states understanding -Medrol Dosepak was pharmacy  No follow-ups on file.

## 2022-10-05 ENCOUNTER — Other Ambulatory Visit (HOSPITAL_COMMUNITY): Payer: Self-pay

## 2022-10-05 NOTE — Progress Notes (Signed)
Paramedicine Encounter    Patient ID: Rhonda [name], female    DOB: 1950-08-16, 72 y.o.   MRN: [id number]   Complaints-nausea X 3-4 days   Edema-   Compliance with meds-for most part-she missed 1 noon dose and 2 evening doses   Pill box filled-yes X 2wks  If so, by whom-paramedic  Refills needed-clonazepam risperidone--needs in 2nd pill box from wed AM to sat PM doses --looks like its only a 15 day supply? Is this always the case???  Torsemide- needs to be placed in 2nd pill box in thurs even to sat even doses   Will get these called in next week.    Husband reports she has been feeling nauseous past few days. He went to get some OTC anti-nausea chewables. He said that has seemed to help.  She denies any increased sob, no increased dizziness, no c/p.  She was in middle of taking AM meds when I arrived and her husband had gave her breakfast already.  Weight stable. No edema noted.      BP 110/64   Pulse (!) 104   Resp 16   Wt 180 lb (81.6 kg)   SpO2 98%   BMI 35.15 kg/m  Weight yesterday-? Last visit weight-179   Patient Care Team: Shon Hale, MD as PCP - General (Family Medicine) Burna Sis, LCSW as Social Worker (Licensed Clinical Social Worker) [name], Noberto Retort, DO as Consulting Physician (Neurology) [name], Paramedic as Paramedic Chanetta Marshall, Meridee Score, MD as Attending Physician (Family Medicine) Clinton Gallant, RN as [facility] Care Management Bensimhon, Bevelyn Buckles, MD as Consulting Physician (Cardiology) Lolly Mustache Phillips Grout, MD as Consulting Physician (Psychiatry) Candelaria Stagers, DPM as Consulting Physician (Podiatry)  Patient Active Problem List   Diagnosis Date Noted   Major depressive disorder, recurrent episode, severe (HCC) 07/27/2022   MDD (major depressive disorder), recurrent severe, without psychosis (HCC) 07/27/2022   MDD (major depressive disorder) 07/27/2022   Elevated troponin 12/02/2021   Nausea & vomiting  12/01/2021   Acute left ankle pain 10/11/2021   Intractable nausea and vomiting 10/08/2021   AKI (acute kidney injury) (HCC) 10/08/2021   Hyponatremia 10/08/2021   Decreased estrogen level 08/01/2021   Dementia (HCC) 08/01/2021   Frail elderly 08/01/2021   Hyperparathyroidism (HCC) 08/01/2021   Spondylolisthesis 08/01/2021   Varicose veins of bilateral lower extremities with other complications 08/01/2021   Lumbago without sciatica 04/30/2021   Abnormal gait 06/11/2020   Adult failure to thrive syndrome 06/11/2020   Allergic rhinitis 06/11/2020   Anxiety 06/11/2020   Asthma 06/11/2020   Benign intracranial hypertension 06/11/2020   Body mass index (BMI) 45.0-49.9, adult (HCC) 06/11/2020   Bowel incontinence 06/11/2020   Cholelithiasis 06/11/2020   Chronic sinusitis 06/11/2020   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   Daytime somnolence 06/11/2020   Diarrhea of presumed infectious origin 06/11/2020   Edema 06/11/2020   Family history of malignant neoplasm of gastrointestinal tract 06/11/2020   Gout 06/11/2020   History of fall 06/11/2020   History of infectious disease 06/11/2020   Insomnia 06/11/2020   Irregular bowel habits 06/11/2020   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   Lumbar spondylosis with myelopathy 06/11/2020   Macrocytosis 06/11/2020   Mild recurrent major depression (HCC) 06/11/2020   Personal history of colonic polyps 06/11/2020   Personal history of malignant neoplasm of breast 06/11/2020   Repeated falls 06/11/2020   Mixed anxiety and depressive disorder 06/11/2020   Irritable bowel syndrome 01/04/2020   Spinal stenosis of lumbar  region 01/03/2020   Other symptoms and signs involving the musculoskeletal system 09/11/2019   Bilateral leg weakness 08/01/2019   Leg swelling 08/01/2019   Diabetes mellitus with neuropathy (HCC) 06/15/2019   Hypokalemia 11/08/2018   CKD (chronic kidney disease) stage 3, GFR 30-59 ml/min (HCC) 11/08/2018   Orthostatic hypotension 07/28/2017    SVD (spontaneous vaginal delivery)    Peripheral neuropathy    On home oxygen therapy    Migraines    Left bundle branch block    Hypothyroidism    Hypertension    Hyperlipidemia    Heart murmur    GERD (gastroesophageal reflux disease)    Exertional shortness of breath    Major depressive disorder    Back pain    Arthritis    Generalized anxiety disorder    Anemia    Chronic respiratory failure (HCC) 09/14/2013   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/04/2013   Morbid obesity (HCC) 11/01/2012   Chronic systolic heart failure (HCC) 10/27/2012   Endometrial polyp 01/20/2012   Malignant tumor of breast (HCC) 03/26/2011   Unspecified vitamin D deficiency 03/26/2011    Current Outpatient Medications:    acetaZOLAMIDE (DIAMOX) 125 MG tablet, Take 1 tablet (125 mg total) by mouth daily., Disp: 90 tablet, Rfl: 3   Ascorbic Acid (VITAMIN C) 1000 MG tablet, Take 1,000 mg by mouth daily., Disp: , Rfl:    buPROPion (WELLBUTRIN XL) 150 MG 24 hr tablet, Take 1 tablet (150 mg total) by mouth daily., Disp: 30 tablet, Rfl: 0   carvedilol (COREG) 3.125 MG tablet, TAKE 1 TABLET (3.125 MG) BY MOUTH TWICE DAILY WITH MEALS (Patient taking differently: Take 3.125 mg by mouth 2 (two) times daily with a meal. TAKE 1 TABLET (3.125 MG) BY MOUTH TWICE DAILY WITH MEALS), Disp: 180 tablet, Rfl: 3   citalopram (CELEXA) 20 MG tablet, Take 1 tablet (20 mg total) by mouth daily., Disp: 30 tablet, Rfl: 0   clonazePAM (KLONOPIN) 0.5 MG tablet, Take 1 tablet (0.5 mg total) by mouth 2 (two) times daily., Disp: 60 tablet, Rfl: 0   cyanocobalamin (VITAMIN B12) 1000 MCG tablet, Take 1,000 mcg by mouth daily., Disp: , Rfl:    dicyclomine (BENTYL) 20 MG tablet, Take 20 mg by mouth 3 (three) times daily before meals., Disp: , Rfl:    fluticasone (FLONASE) 50 MCG/ACT nasal spray, Place 1 spray into both nostrils daily., Disp: , Rfl:    gabapentin (NEURONTIN) 100 MG capsule, Take 1 capsule (100  mg total) by mouth 2 (two) times daily., Disp: 180 capsule, Rfl: 3   levothyroxine (SYNTHROID) 88 MCG tablet, TAKE 1 TABLET(88 MCG) BY MOUTH DAILY BEFORE BREAKFAST (Patient taking differently: Take 88 mcg by mouth daily before breakfast. TAKE 1 TABLET(88 MCG) BY MOUTH DAILY BEFORE BREAKFAST), Disp: 90 tablet, Rfl: 3   memantine (NAMENDA) 5 MG tablet, Take 1 tablet (5 mg total) by mouth daily., Disp: 30 tablet, Rfl: 3   metolazone (ZAROXOLYN) 2.5 MG tablet, TAKE 1 TABLET BY MOUTH EVERY OTHER TUESDAY. (Patient taking differently: Take 2.5 mg by mouth. Take 1 tablet by mouth every other Tuesday.), Disp: 12 tablet, Rfl: 3   midodrine (PROAMATINE) 10 MG tablet, TAKE 1 TABLET THREE TIMES DAILY WITH MEALS (Patient taking differently: Take 10 mg by mouth 3 (three) times daily. TAKE 1 TABLET THREE TIMES DAILY WITH MEALS), Disp: 270 tablet, Rfl: 3   Multiple Vitamin (MULTIVITAMIN WITH MINERALS) TABS tablet, Take 1 tablet by mouth daily., Disp: , Rfl:    potassium  chloride SA (KLOR-CON M) 20 MEQ tablet, TAKE 2 TABLETS THREE TIMES DAILY. TAKE AN EXTRA 2 TABLETS ON DAYS YOU TAKE METOLAZONE (Patient taking differently: Take 40 mEq by mouth 3 (three) times daily.), Disp: 572 tablet, Rfl: 3   risperiDONE (RISPERDAL) 0.5 MG tablet, Take 1 tablet (0.5 mg total) by mouth 2 (two) times daily at 8 am and 4 pm., Disp: 30 tablet, Rfl: 3   simvastatin (ZOCOR) 10 MG tablet, TAKE 1 TABLET EVERY EVENING, Disp: 90 tablet, Rfl: 3   spironolactone (ALDACTONE) 25 MG tablet, TAKE 1 TABLET (25 MG TOTAL) BY MOUTH EVERY EVENING., Disp: 90 tablet, Rfl: 3   torsemide (DEMADEX) 20 MG tablet, Take 4 tablets (80 mg total) by mouth 2 (two) times daily., Disp: 240 tablet, Rfl: 6   methylPREDNISolone (MEDROL DOSEPAK) 4 MG TBPK tablet, Take as directed, Disp: 21 each, Rfl: 0 Allergies  Allergen Reactions   Ceftin Anaphylaxis    Face and throat swell    Cefuroxime Axetil Anaphylaxis    Face and throat swell   Geodon [Ziprasidone Hcl] Hives    Lisinopril Other (See Comments)    angioedema   Cefuroxime     Other reaction(s): anaphylaxis   Sulfacetamide Sodium-Sulfur     Other reaction(s): itch   Allopurinol Nausea Only and Other (See Comments)    weakness   Ativan [Lorazepam] Itching   Sulfa Antibiotics Itching   Valium [Diazepam] Other (See Comments)    Patient states that diazepam doesn't relax, it has the opposite effect.      Social History   Socioeconomic History   Marital status: Married    Spouse name: Not on file   Number of children: 2   Years of education: 84   Highest education level: Master's degree (e.g., MA, MS, MEng, MEd, MSW, MBA)  Occupational History   Occupation: retired  Tobacco Use   Smoking status: Some Days    Packs/day: 0.10    Years: 26.00    Additional pack years: 0.00    Total pack years: 2.60    Types: Cigarettes    Last attempt to quit: 03/23/2021    Years since quitting: 1.5   Smokeless tobacco: Never  Vaping Use   Vaping Use: Never used  Substance and Sexual Activity   Alcohol use: No   Drug use: No   Sexual activity: Yes  Other Topics Concern   Not on file  Social History Narrative   Tobacco Use Cigarettes: Former Smoker, Quit in 2008   No Alcohol   No recreational drug use   Diet: Regular/Low Carb   Exercise: None   Occupation: disabled   Education: Company secretary, masters   Children: 2   Firearms: No   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      Social Determinants of Health   Financial Resource Strain: Not on file  Food Insecurity: No Food Insecurity (08/11/2022)   Hunger Vital Sign    Worried About Running Out of Food in the Last Year: Never true    Ran Out of Food in the Last Year: Never true  Transportation Needs: No Transportation Needs (08/11/2022)   PRAPARE - Administrator, Civil Service (Medical): No    Lack of Transportation (Non-Medical): No  Physical Activity: Not on file  Stress: No Stress  Concern Present (08/11/2022)   [name] of Occupational Health - Occupational Stress Questionnaire    Feeling of  Stress : Only a little  Social Connections: Not on file  Intimate Partner Violence: Not At Risk (07/27/2022)   Humiliation, Afraid, Rape, and Kick questionnaire    Fear of Current or Ex-Partner: No    Emotionally Abused: No    Physically Abused: No    Sexually Abused: No    Physical Exam      Future Appointments  Date Time Provider Department Center  10/21/2022  7:05 AM [facility] DEVICE REMOTES [facility]  11/12/2022  4:00 PM Arfeen, Phillips Grout, MD [facility] None  12/14/2022  7:10 AM [facility] DEVICE REMOTES [facility]  02/03/2023 10:45 AM Freddie Breech, DPM [facility]  03/15/2023  7:10 AM [facility] DEVICE REMOTES [facility]  06/02/2023  1:00 PM Rosann Auerbach, PhD [facility] None  06/14/2023  7:10 AM [facility] DEVICE REMOTES [facility]  06/29/2023  1:30 PM Nita Sickle K, DO [facility] None  09/13/2023  7:10 AM [facility] DEVICE REMOTES [facility]       Kerry Hough, Paramedic [phone] [facility] Paramedic  10/05/22

## 2022-10-14 DIAGNOSIS — Z79899 Other long term (current) drug therapy: Secondary | ICD-10-CM | POA: Diagnosis not present

## 2022-10-14 DIAGNOSIS — R110 Nausea: Secondary | ICD-10-CM | POA: Diagnosis not present

## 2022-10-14 DIAGNOSIS — I5032 Chronic diastolic (congestive) heart failure: Secondary | ICD-10-CM | POA: Diagnosis not present

## 2022-10-14 DIAGNOSIS — F339 Major depressive disorder, recurrent, unspecified: Secondary | ICD-10-CM | POA: Diagnosis not present

## 2022-10-15 ENCOUNTER — Other Ambulatory Visit (HOSPITAL_COMMUNITY): Payer: Self-pay

## 2022-10-15 DIAGNOSIS — Z961 Presence of intraocular lens: Secondary | ICD-10-CM | POA: Diagnosis not present

## 2022-10-15 DIAGNOSIS — H35363 Drusen (degenerative) of macula, bilateral: Secondary | ICD-10-CM | POA: Diagnosis not present

## 2022-10-15 DIAGNOSIS — H4322 Crystalline deposits in vitreous body, left eye: Secondary | ICD-10-CM | POA: Diagnosis not present

## 2022-10-15 DIAGNOSIS — E119 Type 2 diabetes mellitus without complications: Secondary | ICD-10-CM | POA: Diagnosis not present

## 2022-10-16 NOTE — Progress Notes (Signed)
Paramedicine Encounter    Patient ID: Rhonda [name], female    DOB: January 18, 1951, 72 y.o.   MRN: [id number]  Met with Rhonda [name] and Rhonda [name] husband in the home where they reported last week she had several episodes of vomiting after taking Rhonda [name] evening meds. She was evaluated by Rhonda [name] PCP yesterday and they advised for Rhonda [name] to discontinue evening meds with HF physician approval. Our visit was after clinic hours so we were unable to evaluate with a provider however I encouraged Rhonda [name] to use Rhonda [name] zofran she was prescribed and to try to continue to take Rhonda [name] medications as prescribed as best she could until we can consult with providers as many of Rhonda [name] evening medications include cardiac meds. She agreed with this plan. I will be reaching out to HF clinic on Tuesday during our next home visit for same. She agreed. Home visit complete. She knows to reach out to me or EMS if an emergency arises.   [name], EMT-Paramedic [phone] 10/16/2022   ACTION: Home visit completed

## 2022-10-20 ENCOUNTER — Other Ambulatory Visit (HOSPITAL_COMMUNITY): Payer: Self-pay

## 2022-10-20 NOTE — Progress Notes (Signed)
Remote ICD transmission.   

## 2022-10-20 NOTE — Progress Notes (Signed)
Paramedicine Encounter    Patient ID: Rhonda [name], female    DOB: 1950-09-14, 72 y.o.   MRN: [id number]   Complaints- fatigue with increased depressive thoughts.   Assessment- CAOX4, warm and dry, seated in recliner, eating breakfast, no shortness of breath, no dizziness, no chest pain, no swelling, lungs clear, weight down 1 lbs., feeling more depressed, nausea, vomiting improved only had one episode of vomiting over the weekend.   Compliance with meds- a few missed doses of morning, noon and evening over the weekend.   Pill box filled- for one week.   Refills needed- clonazepam, risperidone, torsemide   Meds changes since last visit- none     Social changes- none    BP 110/62   Pulse 77   Resp 16   Wt 179 lb (81.2 kg)   SpO2 97%   BMI 34.96 kg/m  Weight yesterday-- didn't weigh  Last visit weight-- 180lbs   Arrived for home visit for Rhonda [name] who reports feeling okay just fatigued with some increased depressive thoughts over the weekend. I advised her to have her husband reach out to psych doctor for follow up sooner if needed- they agreed. She states he had one episode of vomiting over the weekend but the Zofran seemed to help. She missed a few doses of medications over the weekend but denied any chest pain, dizziness or shortness of breath. She denied any active abdominal pain or nausea. She agreed to take her medications as prescribed for now as she feels maybe her nausea and vomiting was unrelated to her medications. I will continue to pass this along to HF clinic and for her to follow up with PCP. She agreed. Assessment and vitals as noted. Meds reviewed and confirmed. Pill box filled for one week. Refills as noted. We reviewed upcoming visits and confirmed same. Home visit complete. She knows to reach out if anything changes.    [name], EMT-Paramedic [phone]  ACTION: Home visit completed    Patient Care Team: Shon Hale, MD as PCP -  General (Family Medicine) Burna Sis, LCSW as Social Worker (Licensed Clinical Social Worker) [name], Noberto Retort, DO as Consulting Physician (Neurology) [name], Paramedic as Paramedic Chanetta Marshall, Meridee Score, MD as Attending Physician (Family Medicine) Clinton Gallant, RN as [facility] Care Management Bensimhon, Bevelyn Buckles, MD as Consulting Physician (Cardiology) Lolly Mustache Phillips Grout, MD as Consulting Physician (Psychiatry) Candelaria Stagers, DPM as Consulting Physician (Podiatry)  Patient Active Problem List   Diagnosis Date Noted   Major depressive disorder, recurrent episode, severe (HCC) 07/27/2022   MDD (major depressive disorder), recurrent severe, without psychosis (HCC) 07/27/2022   MDD (major depressive disorder) 07/27/2022   Elevated troponin 12/02/2021   Nausea & vomiting 12/01/2021   Acute left ankle pain 10/11/2021   Intractable nausea and vomiting 10/08/2021   AKI (acute kidney injury) (HCC) 10/08/2021   Hyponatremia 10/08/2021   Decreased estrogen level 08/01/2021   Dementia (HCC) 08/01/2021   Frail elderly 08/01/2021   Hyperparathyroidism (HCC) 08/01/2021   Spondylolisthesis 08/01/2021   Varicose veins of bilateral lower extremities with other complications 08/01/2021   Lumbago without sciatica 04/30/2021   Abnormal gait 06/11/2020   Adult failure to thrive syndrome 06/11/2020   Allergic rhinitis 06/11/2020   Anxiety 06/11/2020   Asthma 06/11/2020   Benign intracranial hypertension 06/11/2020   Body mass index (BMI) 45.0-49.9, adult (HCC) 06/11/2020   Bowel incontinence 06/11/2020   Cholelithiasis 06/11/2020   Chronic sinusitis 06/11/2020  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   Daytime somnolence 06/11/2020   Diarrhea of presumed infectious origin 06/11/2020   Edema 06/11/2020   Family history of malignant neoplasm of gastrointestinal tract 06/11/2020   Gout 06/11/2020   History of fall 06/11/2020   History of infectious disease 06/11/2020   Insomnia  06/11/2020   Irregular bowel habits 06/11/2020   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   Lumbar spondylosis with myelopathy 06/11/2020   Macrocytosis 06/11/2020   Mild recurrent major depression (HCC) 06/11/2020   Personal history of colonic polyps 06/11/2020   Personal history of malignant neoplasm of breast 06/11/2020   Repeated falls 06/11/2020   Mixed anxiety and depressive disorder 06/11/2020   Irritable bowel syndrome 01/04/2020   Spinal stenosis of lumbar region 01/03/2020   Other symptoms and signs involving the musculoskeletal system 09/11/2019   Bilateral leg weakness 08/01/2019   Leg swelling 08/01/2019   Diabetes mellitus with neuropathy (HCC) 06/15/2019   Hypokalemia 11/08/2018   CKD (chronic kidney disease) stage 3, GFR 30-59 ml/min (HCC) 11/08/2018   Orthostatic hypotension 07/28/2017   SVD (spontaneous vaginal delivery)    Peripheral neuropathy    On home oxygen therapy    Migraines    Left bundle branch block    Hypothyroidism    Hypertension    Hyperlipidemia    Heart murmur    GERD (gastroesophageal reflux disease)    Exertional shortness of breath    Major depressive disorder    Back pain    Arthritis    Generalized anxiety disorder    Anemia    Chronic respiratory failure (HCC) 09/14/2013   Biventricular ICD (implantable cardioverter-defibrillator) in place 08/04/2013   Morbid obesity (HCC) 11/01/2012   Chronic systolic heart failure (HCC) 10/27/2012   Endometrial polyp 01/20/2012   Malignant tumor of breast (HCC) 03/26/2011   Unspecified vitamin D deficiency 03/26/2011    Current Outpatient Medications:    acetaZOLAMIDE (DIAMOX) 125 MG tablet, Take 1 tablet (125 mg total) by mouth daily., Disp: 90 tablet, Rfl: 3   Ascorbic Acid (VITAMIN C) 1000 MG tablet, Take 1,000 mg by mouth daily., Disp: , Rfl:    buPROPion (WELLBUTRIN XL) 150 MG 24 hr tablet, Take 1 tablet (150 mg total) by mouth daily., Disp: 30 tablet, Rfl: 0   carvedilol (COREG) 3.125 MG tablet,  TAKE 1 TABLET (3.125 MG) BY MOUTH TWICE DAILY WITH MEALS (Patient taking differently: Take 3.125 mg by mouth 2 (two) times daily with a meal. TAKE 1 TABLET (3.125 MG) BY MOUTH TWICE DAILY WITH MEALS), Disp: 180 tablet, Rfl: 3   citalopram (CELEXA) 20 MG tablet, Take 1 tablet (20 mg total) by mouth daily., Disp: 30 tablet, Rfl: 0   clonazePAM (KLONOPIN) 0.5 MG tablet, Take 1 tablet (0.5 mg total) by mouth 2 (two) times daily., Disp: 60 tablet, Rfl: 0   cyanocobalamin (VITAMIN B12) 1000 MCG tablet, Take 1,000 mcg by mouth daily., Disp: , Rfl:    dicyclomine (BENTYL) 20 MG tablet, Take 20 mg by mouth 3 (three) times daily before meals., Disp: , Rfl:    fluticasone (FLONASE) 50 MCG/ACT nasal spray, Place 1 spray into both nostrils daily., Disp: , Rfl:    gabapentin (NEURONTIN) 100 MG capsule, Take 1 capsule (100 mg total) by mouth 2 (two) times daily., Disp: 180 capsule, Rfl: 3   levothyroxine (SYNTHROID) 88 MCG tablet, TAKE 1 TABLET(88 MCG) BY MOUTH DAILY BEFORE BREAKFAST (Patient taking differently: Take 88 mcg by mouth daily before breakfast. TAKE 1 TABLET(88 MCG) BY MOUTH DAILY BEFORE  BREAKFAST), Disp: 90 tablet, Rfl: 3   memantine (NAMENDA) 5 MG tablet, Take 1 tablet (5 mg total) by mouth daily., Disp: 30 tablet, Rfl: 3   metolazone (ZAROXOLYN) 2.5 MG tablet, TAKE 1 TABLET BY MOUTH EVERY OTHER TUESDAY. (Patient taking differently: Take 2.5 mg by mouth. Take 1 tablet by mouth every other Tuesday.), Disp: 12 tablet, Rfl: 3   midodrine (PROAMATINE) 10 MG tablet, TAKE 1 TABLET THREE TIMES DAILY WITH MEALS (Patient taking differently: Take 10 mg by mouth 3 (three) times daily. TAKE 1 TABLET THREE TIMES DAILY WITH MEALS), Disp: 270 tablet, Rfl: 3   Multiple Vitamin (MULTIVITAMIN WITH MINERALS) TABS tablet, Take 1 tablet by mouth daily., Disp: , Rfl:    potassium chloride SA (KLOR-CON M) 20 MEQ tablet, TAKE 2 TABLETS THREE TIMES DAILY. TAKE AN EXTRA 2 TABLETS ON DAYS YOU TAKE METOLAZONE (Patient taking  differently: Take 40 mEq by mouth 3 (three) times daily.), Disp: 572 tablet, Rfl: 3   risperiDONE (RISPERDAL) 0.5 MG tablet, Take 1 tablet (0.5 mg total) by mouth 2 (two) times daily at 8 am and 4 pm., Disp: 30 tablet, Rfl: 3   simvastatin (ZOCOR) 10 MG tablet, TAKE 1 TABLET EVERY EVENING, Disp: 90 tablet, Rfl: 3   spironolactone (ALDACTONE) 25 MG tablet, TAKE 1 TABLET (25 MG TOTAL) BY MOUTH EVERY EVENING., Disp: 90 tablet, Rfl: 3   torsemide (DEMADEX) 20 MG tablet, Take 4 tablets (80 mg total) by mouth 2 (two) times daily., Disp: 240 tablet, Rfl: 6   methylPREDNISolone (MEDROL DOSEPAK) 4 MG TBPK tablet, Take as directed (Patient not taking: Reported on 10/20/2022), Disp: 21 each, Rfl: 0 Allergies  Allergen Reactions   Ceftin Anaphylaxis    Face and throat swell    Cefuroxime Axetil Anaphylaxis    Face and throat swell   Geodon [Ziprasidone Hcl] Hives   Lisinopril Other (See Comments)    angioedema   Cefuroxime     Other reaction(s): anaphylaxis   Sulfacetamide Sodium-Sulfur     Other reaction(s): itch   Allopurinol Nausea Only and Other (See Comments)    weakness   Ativan [Lorazepam] Itching   Sulfa Antibiotics Itching   Valium [Diazepam] Other (See Comments)    Patient states that diazepam doesn't relax, it has the opposite effect.     Social History   Socioeconomic History   Marital status: Married    Spouse name: Not on file   Number of children: 2   Years of education: 56   Highest education level: Master's degree (e.g., MA, MS, MEng, MEd, MSW, MBA)  Occupational History   Occupation: retired  Tobacco Use   Smoking status: Some Days    Packs/day: 0.10    Years: 26.00    Additional pack years: 0.00    Total pack years: 2.60    Types: Cigarettes    Last attempt to quit: 03/23/2021    Years since quitting: 1.5   Smokeless tobacco: Never  Vaping Use   Vaping Use: Never used  Substance and Sexual Activity   Alcohol use: No   Drug use: No   Sexual activity: Yes   Other Topics Concern   Not on file  Social History Narrative   Tobacco Use Cigarettes: Former Smoker, Quit in 2008   No Alcohol   No recreational drug use   Diet: Regular/Low Carb   Exercise: None   Occupation: disabled   Education: Company secretary, masters   Children: 2   Firearms: No   Risk analyst Use:  Always   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      Social Determinants of Health   Financial Resource Strain: Not on file  Food Insecurity: No Food Insecurity (08/11/2022)   Hunger Vital Sign    Worried About Running Out of Food in the Last Year: Never true    Ran Out of Food in the Last Year: Never true  Transportation Needs: No Transportation Needs (08/11/2022)   PRAPARE - Administrator, Civil Service (Medical): No    Lack of Transportation (Non-Medical): No  Physical Activity: Not on file  Stress: No Stress Concern Present (08/11/2022)   [name] of Occupational Health - Occupational Stress Questionnaire    Feeling of Stress : Only a little  Social Connections: Not on file  Intimate Partner Violence: Not At Risk (07/27/2022)   Humiliation, Afraid, Rape, and Kick questionnaire    Fear of Current or Ex-Partner: No    Emotionally Abused: No    Physically Abused: No    Sexually Abused: No    Physical Exam      Future Appointments  Date Time Provider Department Center  10/21/2022  7:05 AM [facility] DEVICE REMOTES [facility]  11/12/2022  4:00 PM Arfeen, Phillips Grout, MD [facility] None  12/14/2022  7:10 AM [facility] DEVICE REMOTES [facility]  02/03/2023 10:45 AM Freddie Breech, DPM [facility]  03/15/2023  7:10 AM [facility] DEVICE REMOTES [facility]  06/02/2023  1:00 PM Rosann Auerbach, PhD [facility] None  06/14/2023  7:10 AM [facility] DEVICE REMOTES [facility]  06/29/2023  1:30 PM Nita Sickle K, DO [facility] None  09/13/2023  7:10 AM [facility] DEVICE REMOTES [facility]

## 2022-10-21 ENCOUNTER — Other Ambulatory Visit (HOSPITAL_COMMUNITY): Payer: Self-pay | Admitting: Physician Assistant

## 2022-10-21 ENCOUNTER — Ambulatory Visit: Payer: Medicare HMO | Attending: Internal Medicine

## 2022-10-21 ENCOUNTER — Other Ambulatory Visit (HOSPITAL_COMMUNITY): Payer: Self-pay | Admitting: Psychiatry

## 2022-10-21 DIAGNOSIS — Z95810 Presence of automatic (implantable) cardiac defibrillator: Secondary | ICD-10-CM

## 2022-10-21 DIAGNOSIS — F330 Major depressive disorder, recurrent, mild: Secondary | ICD-10-CM

## 2022-10-21 DIAGNOSIS — F411 Generalized anxiety disorder: Secondary | ICD-10-CM

## 2022-10-21 DIAGNOSIS — I5022 Chronic systolic (congestive) heart failure: Secondary | ICD-10-CM | POA: Diagnosis not present

## 2022-10-23 NOTE — Progress Notes (Signed)
EPIC Encounter for ICM Monitoring  Patient Name: Rhonda [name] is a 72 y.o. female Date: 10/23/2022 Primary Care Physican: Shon Hale, MD Primary Cardiologist: Bensimhon Electrophysiologist: Rae Roam Pacing: 98.7%   06/01/2022 Weight: 193 lbs  07/08/2022 Weight: 183 lbs 08/10/2022 Weight: 185 lbs         Transmission reviewed.               Optivol thoracic impedance suggesting intermittent days with possible fluid accumulation.      Prescribed:  Patient participating in paramedicine program to assist with meds. Torsemide 20 mg 4 tablets (80 mg total) by mouth twice a day.   Potassium 20 meq 2 tablets (40 mEq total) by mouth 3 (three) times daily.  Take extra 2 tablets on days you take Metolazone.   Metolazone 2.5mg  1 tablet every other Tuesday.  (dosage due 11/17/22) Spironolactone 25 mg take 1 tablet by mouth every evening.   Labs: 08/01/2022 Creatinine 1.47, BUN 42, Potassium 3.1, Sodium 136, GFR 38  07/26/2022 Creatinine 2.02, BUN 25, Potassium 4.6, Sodium 133, GFR 26  07/22/2022 Creatinine 1.57, BUN 20, Potassium 3.8, Sodium 136, GFR 35  07/14/2022 Creatinine 1.32, BUN 20, Potassium 4.2, Sodium 138, GFR 44 06/25/2022 Creatinine 2.19, BUN 29, Potassium 3.5, Sodium 134, GFR 24 A complete set of results can be found in Results Review.   Recommendations:  No changes   Follow-up plan: ICM clinic phone appointment on 11/23/2022.   91 day device clinic remote transmission 12/14/2022.     EP/Cardiology Office Visits:   Recall 07/06/2022 with Dr Gala Romney.  Recall 03/20/2023 with Dr Ladona Ridgel.     Copy of ICM check sent to Dr. Ladona Ridgel.   3 month ICM trend: 10/21/2022.    12-14 Month ICM trend:     Karie Soda, RN 10/23/2022 3:32 PM

## 2022-10-26 ENCOUNTER — Telehealth (HOSPITAL_COMMUNITY): Payer: Self-pay

## 2022-10-26 ENCOUNTER — Other Ambulatory Visit (HOSPITAL_COMMUNITY): Payer: Self-pay

## 2022-10-26 DIAGNOSIS — F330 Major depressive disorder, recurrent, mild: Secondary | ICD-10-CM

## 2022-10-26 DIAGNOSIS — F411 Generalized anxiety disorder: Secondary | ICD-10-CM

## 2022-10-26 NOTE — Progress Notes (Signed)
Paramedicine Encounter    Patient ID: Rhonda [name], female    DOB: 11/14/1950, 72 y.o.   MRN: [id number]   Complaints- feeling depressed   Assessment- CAOX4, warm and dry, ambulatory in her home, no complaints of pain, shortness of breath or dizziness. She reports having increased depressive symptoms with feeling no energy and having many hours of sleep daily. She follows up with Dr. Lolly Mustache on June 20th- I advised her trying to get a sooner appointment. She denied any thoughts of self harm. I obtained vitals and assessment. No lower leg swelling, weight normal. Two missed noon doses of medications. Pill box filled for one week. Refills as noted- called into [facility] and they have no received two RX"s from doctor that we requested last week. I will send message for Risperidone and Clonazepam. Appointments and education discussed. Home visit complete. I will see Jaleen in one week.   Compliance with meds- missed two noon doses   Pill box filled- one week  Refills needed- clonazepam, namenda, risperidone   Meds changes since last visit- none     Social changes- none    BP 120/62   Pulse 84   Resp 18   Wt 180 lb (81.6 kg)   SpO2 98%   BMI 35.15 kg/m  Weight yesterday-- didn't weigh  Last visit weight-- 179lbs    [name], EMT-Paramedic [phone]  ACTION: Home visit completed    Patient Care Team: Shon Hale, MD as PCP - General (Family Medicine) Burna Sis, LCSW as Social Worker (Licensed Clinical Social Worker) [name], Noberto Retort, DO as Consulting Physician (Neurology) [name], Paramedic as Paramedic Chanetta Marshall, Meridee Score, MD as Attending Physician (Family Medicine) Clinton Gallant, RN as [facility] Care Management Bensimhon, Bevelyn Buckles, MD as Consulting Physician (Cardiology) Lolly Mustache, Phillips Grout, MD as Consulting Physician (Psychiatry) Candelaria Stagers, DPM as Consulting Physician (Podiatry)  Patient Active Problem List    Diagnosis Date Noted   Major depressive disorder, recurrent episode, severe (HCC) 07/27/2022   MDD (major depressive disorder), recurrent severe, without psychosis (HCC) 07/27/2022   MDD (major depressive disorder) 07/27/2022   Elevated troponin 12/02/2021   Nausea & vomiting 12/01/2021   Acute left ankle pain 10/11/2021   Intractable nausea and vomiting 10/08/2021   AKI (acute kidney injury) (HCC) 10/08/2021   Hyponatremia 10/08/2021   Decreased estrogen level 08/01/2021   Dementia (HCC) 08/01/2021   Frail elderly 08/01/2021   Hyperparathyroidism (HCC) 08/01/2021   Spondylolisthesis 08/01/2021   Varicose veins of bilateral lower extremities with other complications 08/01/2021   Lumbago without sciatica 04/30/2021   Abnormal gait 06/11/2020   Adult failure to thrive syndrome 06/11/2020   Allergic rhinitis 06/11/2020   Anxiety 06/11/2020   Asthma 06/11/2020   Benign intracranial hypertension 06/11/2020   Body mass index (BMI) 45.0-49.9, adult (HCC) 06/11/2020   Bowel incontinence 06/11/2020   Cholelithiasis 06/11/2020   Chronic sinusitis 06/11/2020   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   Daytime somnolence 06/11/2020   Diarrhea of presumed infectious origin 06/11/2020   Edema 06/11/2020   Family history of malignant neoplasm of gastrointestinal tract 06/11/2020   Gout 06/11/2020   History of fall 06/11/2020   History of infectious disease 06/11/2020   Insomnia 06/11/2020   Irregular bowel habits 06/11/2020   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   Lumbar spondylosis with myelopathy 06/11/2020   Macrocytosis 06/11/2020   Mild recurrent major depression (HCC) 06/11/2020   Personal history of colonic polyps 06/11/2020   Personal history of malignant neoplasm  of breast 06/11/2020   Repeated falls 06/11/2020   Mixed anxiety and depressive disorder 06/11/2020   Irritable bowel syndrome 01/04/2020   Spinal stenosis of lumbar region 01/03/2020   Other symptoms and signs involving the  musculoskeletal system 09/11/2019   Bilateral leg weakness 08/01/2019   Leg swelling 08/01/2019   Diabetes mellitus with neuropathy (HCC) 06/15/2019   Hypokalemia 11/08/2018   CKD (chronic kidney disease) stage 3, GFR 30-59 ml/min (HCC) 11/08/2018   Orthostatic hypotension 07/28/2017   SVD (spontaneous vaginal delivery)    Peripheral neuropathy    On home oxygen therapy    Migraines    Left bundle branch block    Hypothyroidism    Hypertension    Hyperlipidemia    Heart murmur    GERD (gastroesophageal reflux disease)    Exertional shortness of breath    Major depressive disorder    Back pain    Arthritis    Generalized anxiety disorder    Anemia    Chronic respiratory failure (HCC) 09/14/2013   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   Morbid obesity (HCC) 11/01/2012   Chronic systolic heart failure (HCC) 10/27/2012   Endometrial polyp 01/20/2012   Malignant tumor of breast (HCC) 03/26/2011   Unspecified vitamin D deficiency 03/26/2011    Current Outpatient Medications:    acetaZOLAMIDE (DIAMOX) 125 MG tablet, Take 1 tablet (125 mg total) by mouth daily., Disp: 90 tablet, Rfl: 3   Ascorbic Acid (VITAMIN C) 1000 MG tablet, Take 1,000 mg by mouth daily., Disp: , Rfl:    buPROPion (WELLBUTRIN XL) 150 MG 24 hr tablet, Take 1 tablet (150 mg total) by mouth daily., Disp: 30 tablet, Rfl: 0   carvedilol (COREG) 3.125 MG tablet, TAKE 1 TABLET (3.125 MG) BY MOUTH TWICE DAILY WITH MEALS, Disp: 180 tablet, Rfl: 3   citalopram (CELEXA) 20 MG tablet, Take 1 tablet (20 mg total) by mouth daily., Disp: 30 tablet, Rfl: 0   clonazePAM (KLONOPIN) 0.5 MG tablet, Take 1 tablet (0.5 mg total) by mouth 2 (two) times daily., Disp: 60 tablet, Rfl: 0   cyanocobalamin (VITAMIN B12) 1000 MCG tablet, Take 1,000 mcg by mouth daily., Disp: , Rfl:    dicyclomine (BENTYL) 20 MG tablet, Take 20 mg by mouth 3 (three) times daily before meals., Disp: , Rfl:    fluticasone  (FLONASE) 50 MCG/ACT nasal spray, Place 1 spray into both nostrils daily., Disp: , Rfl:    gabapentin (NEURONTIN) 100 MG capsule, Take 1 capsule (100 mg total) by mouth 2 (two) times daily., Disp: 180 capsule, Rfl: 3   levothyroxine (SYNTHROID) 88 MCG tablet, TAKE 1 TABLET(88 MCG) BY MOUTH DAILY BEFORE BREAKFAST, Disp: 90 tablet, Rfl: 3   memantine (NAMENDA) 5 MG tablet, Take 1 tablet (5 mg total) by mouth daily., Disp: 30 tablet, Rfl: 3   methylPREDNISolone (MEDROL DOSEPAK) 4 MG TBPK tablet, Take as directed, Disp: 21 each, Rfl: 0   metolazone (ZAROXOLYN) 2.5 MG tablet, TAKE 1 TABLET BY MOUTH EVERY OTHER TUESDAY., Disp: 12 tablet, Rfl: 3   midodrine (PROAMATINE) 10 MG tablet, TAKE 1 TABLET THREE TIMES DAILY WITH MEALS, Disp: 270 tablet, Rfl: 3   Multiple Vitamin (MULTIVITAMIN WITH MINERALS) TABS tablet, Take 1 tablet by mouth daily., Disp: , Rfl:    potassium chloride SA (KLOR-CON M) 20 MEQ tablet, TAKE 2 TABLETS THREE TIMES DAILY. TAKE AN EXTRA 2 TABLETS ON DAYS YOU TAKE METOLAZONE, Disp: 572 tablet, Rfl: 3   risperiDONE (RISPERDAL) 0.5 MG tablet, Take 1  tablet (0.5 mg total) by mouth 2 (two) times daily at 8 am and 4 pm., Disp: 30 tablet, Rfl: 3   simvastatin (ZOCOR) 10 MG tablet, TAKE 1 TABLET EVERY EVENING, Disp: 90 tablet, Rfl: 3   spironolactone (ALDACTONE) 25 MG tablet, TAKE 1 TABLET (25 MG TOTAL) BY MOUTH EVERY EVENING., Disp: 90 tablet, Rfl: 3   torsemide (DEMADEX) 20 MG tablet, Take 4 tablets (80 mg total) by mouth in the morning AND 3 tablets (60 mg total) every evening. NEEDS FOLLOW UP APPOINTMENT FOR ANYMORE REFILLS., Disp: 210 tablet, Rfl: 0 Allergies  Allergen Reactions   Ceftin Anaphylaxis    Face and throat swell    Cefuroxime Axetil Anaphylaxis    Face and throat swell   Geodon [Ziprasidone Hcl] Hives   Lisinopril Other (See Comments)    angioedema   Cefuroxime     Other reaction(s): anaphylaxis   Sulfacetamide Sodium-Sulfur     Other reaction(s): itch   Allopurinol  Nausea Only and Other (See Comments)    weakness   Ativan [Lorazepam] Itching   Sulfa Antibiotics Itching   Valium [Diazepam] Other (See Comments)    Patient states that diazepam doesn't relax, it has the opposite effect.     Social History   Socioeconomic History   Marital status: Married    Spouse name: Not on file   Number of children: 2   Years of education: 81   Highest education level: Master's degree (e.g., MA, MS, MEng, MEd, MSW, MBA)  Occupational History   Occupation: retired  Tobacco Use   Smoking status: Some Days    Packs/day: 0.10    Years: 26.00    Additional pack years: 0.00    Total pack years: 2.60    Types: Cigarettes    Last attempt to quit: 03/23/2021    Years since quitting: 1.5   Smokeless tobacco: Never  Vaping Use   Vaping Use: Never used  Substance and Sexual Activity   Alcohol use: No   Drug use: No   Sexual activity: Yes  Other Topics Concern   Not on file  Social History Narrative   Tobacco Use Cigarettes: Former Smoker, Quit in 2008   No Alcohol   No recreational drug use   Diet: Regular/Low Carb   Exercise: None   Occupation: disabled   Education: Company secretary, masters   Children: 2   Firearms: No   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      Social Determinants of Health   Financial Resource Strain: Not on file  Food Insecurity: No Food Insecurity (08/11/2022)   Hunger Vital Sign    Worried About Running Out of Food in the Last Year: Never true    Ran Out of Food in the Last Year: Never true  Transportation Needs: No Transportation Needs (08/11/2022)   PRAPARE - Administrator, Civil Service (Medical): No    Lack of Transportation (Non-Medical): No  Physical Activity: Not on file  Stress: No Stress Concern Present (08/11/2022)   [name] of Occupational Health - Occupational Stress Questionnaire    Feeling of Stress : Only a little  Social Connections: Not on file   Intimate Partner Violence: Not At Risk (07/27/2022)   Humiliation, Afraid, Rape, and Kick questionnaire    Fear of Current or Ex-Partner: No    Emotionally Abused: No    Physically Abused: No    Sexually Abused:  No    Physical Exam      Future Appointments  Date Time Provider Department Center  11/12/2022  4:00 PM Cleotis Nipper, MD [facility] None  11/23/2022  7:30 AM [facility] DEVICE REMOTES [facility]  12/14/2022  7:10 AM [facility] DEVICE REMOTES [facility]  02/03/2023 10:45 AM Freddie Breech, DPM [facility]  03/15/2023  7:10 AM [facility] DEVICE REMOTES [facility]  06/02/2023  1:00 PM Rosann Auerbach, PhD [facility] None  06/14/2023  7:10 AM [facility]ICE [facility]OT[facility]  06/29/2023  1:30 PM Nita Sickle K, DO [facility] None  09/13/2023  7:10 AM [facility] DEVICE REMOTES [facility]

## 2022-10-26 NOTE — Telephone Encounter (Signed)
Spoke to [facility] for Mrs. Meriam Sprague and they report they have not received the following prescriptions after sending a request last week-  -Clonazepam -Risperidone   Patient is out of these currently. Please send these two to [facility] for patient pick up. She has been without same for two days now and has non on hand for today and so on.   Thanks so much!   [name], EMT-Paramedic [phone] 10/26/2022

## 2022-10-27 MED ORDER — CLONAZEPAM 0.5 MG PO TABS: 0.5000 mg | ORAL_TABLET | Freq: Two times a day (BID) | ORAL | 0 refills | Status: DC

## 2022-10-27 MED ORDER — RISPERIDONE 0.5 MG PO TABS: 0.5000 mg | ORAL_TABLET | ORAL | 0 refills | Status: DC

## 2022-10-27 NOTE — Addendum Note (Signed)
Addended by: Kathryne Sharper T on: 10/27/2022 09:11 AM   Modules accepted: Orders

## 2022-10-27 NOTE — Telephone Encounter (Signed)
A 30 day supply sent to pharmacy until her next appointment.

## 2022-11-02 ENCOUNTER — Other Ambulatory Visit (HOSPITAL_COMMUNITY): Payer: Self-pay

## 2022-11-02 NOTE — Progress Notes (Signed)
Paramedicine Encounter    Patient ID: Rhonda [name], female    DOB: 20-Apr-1951, 72 y.o.   MRN: [id number]   Complaints- none   Assessment- CAOX4, warm and dry seated in her bedroom in chair, no swelling, no shortness of breath, no chest pain, no dizziness, 5 lbs weight gain in one week  Compliance with meds- missed morning, one noon and one evening dose over last week   Pill box filled- for one week   Refills needed- none   Meds changes since last visit- none     Social changes- none    BP 130/68   Pulse 77   Resp 16   Wt 185 lb (83.9 kg)   SpO2 99%   BMI 36.13 kg/m  Weight yesterday-- didn't weigh  Last visit weight-- 180lbs  Arrived for home visit for Rhonda [name] who reports to be feeling well today with no complaints. She denied any recent symptoms of chest pain, dizziness, shortness of breath or swelling. Her weight is up 5 lbs but she missed two doses of torsemide over the last week. Tomorrow is a metolazone day so we will keep a check on how she is doing throughout the week. Lungs clear. Vitals as noted. I reviewed meds and confirmed same. Pill box filled for one week. Appointments confirmed. No refills. I advised her to be compliant with meds and to take them as placed in pill box daily. She agreed. I will see Rhonda [name] in one week. She agreed with plans.    [name], EMT-Paramedic [phone]  ACTION: Home visit completed    Patient Care Team: Shon Hale, MD as PCP - General (Family Medicine) Burna Sis, LCSW as Social Worker (Licensed Clinical Social Worker) [name], Noberto Retort, DO as Consulting Physician (Neurology) [name], Paramedic as Paramedic Chanetta Marshall, Meridee Score, MD as Attending Physician (Family Medicine) Clinton Gallant, RN as [facility] Care Management Bensimhon, Bevelyn Buckles, MD as Consulting Physician (Cardiology) Lolly Mustache, Phillips Grout, MD as Consulting Physician (Psychiatry) Candelaria Stagers, DPM as Consulting Physician  (Podiatry)  Patient Active Problem List   Diagnosis Date Noted   Major depressive disorder, recurrent episode, severe (HCC) 07/27/2022   MDD (major depressive disorder), recurrent severe, without psychosis (HCC) 07/27/2022   MDD (major depressive disorder) 07/27/2022   Elevated troponin 12/02/2021   Nausea & vomiting 12/01/2021   Acute left ankle pain 10/11/2021   Intractable nausea and vomiting 10/08/2021   AKI (acute kidney injury) (HCC) 10/08/2021   Hyponatremia 10/08/2021   Decreased estrogen level 08/01/2021   Dementia (HCC) 08/01/2021   Frail elderly 08/01/2021   Hyperparathyroidism (HCC) 08/01/2021   Spondylolisthesis 08/01/2021   Varicose veins of bilateral lower extremities with other complications 08/01/2021   Lumbago without sciatica 04/30/2021   Abnormal gait 06/11/2020   Adult failure to thrive syndrome 06/11/2020   Allergic rhinitis 06/11/2020   Anxiety 06/11/2020   Asthma 06/11/2020   Benign intracranial hypertension 06/11/2020   Body mass index (BMI) 45.0-49.9, adult (HCC) 06/11/2020   Bowel incontinence 06/11/2020   Cholelithiasis 06/11/2020   Chronic sinusitis 06/11/2020   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   Daytime somnolence 06/11/2020   Diarrhea of presumed infectious origin 06/11/2020   Edema 06/11/2020   Family history of malignant neoplasm of gastrointestinal tract 06/11/2020   Gout 06/11/2020   History of fall 06/11/2020   History of infectious disease 06/11/2020   Insomnia 06/11/2020   Irregular bowel habits 06/11/2020   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   Lumbar spondylosis with myelopathy  06/11/2020   Macrocytosis 06/11/2020   Mild recurrent major depression (HCC) 06/11/2020   Personal history of colonic polyps 06/11/2020   Personal history of malignant neoplasm of breast 06/11/2020   Repeated falls 06/11/2020   Mixed anxiety and depressive disorder 06/11/2020   Irritable bowel syndrome 01/04/2020   Spinal stenosis of lumbar region 01/03/2020   Other  symptoms and signs involving the musculoskeletal system 09/11/2019   Bilateral leg weakness 08/01/2019   Leg swelling 08/01/2019   Diabetes mellitus with neuropathy (HCC) 06/15/2019   Hypokalemia 11/08/2018   CKD (chronic kidney disease) stage 3, GFR 30-59 ml/min (HCC) 11/08/2018   Orthostatic hypotension 07/28/2017   SVD (spontaneous vaginal delivery)    Peripheral neuropathy    On home oxygen therapy    Migraines    Left bundle branch block    Hypothyroidism    Hypertension    Hyperlipidemia    Heart murmur    GERD (gastroesophageal reflux disease)    Exertional shortness of breath    Major depressive disorder    Back pain    Arthritis    Generalized anxiety disorder    Anemia    Chronic respiratory failure (HCC) 09/14/2013   Biventricular ICD (implantable cardioverter-defibrillator) in place 08/04/2013   Morbid obesity (HCC) 11/01/2012   Chronic systolic heart failure (HCC) 10/27/2012   Endometrial polyp 01/20/2012   Malignant tumor of breast (HCC) 03/26/2011   Unspecified vitamin D deficiency 03/26/2011    Current Outpatient Medications:    acetaZOLAMIDE (DIAMOX) 125 MG tablet, Take 1 tablet (125 mg total) by mouth daily., Disp: 90 tablet, Rfl: 3   Ascorbic Acid (VITAMIN C) 1000 MG tablet, Take 1,000 mg by mouth daily., Disp: , Rfl:    buPROPion (WELLBUTRIN XL) 150 MG 24 hr tablet, Take 1 tablet (150 mg total) by mouth daily., Disp: 30 tablet, Rfl: 0   carvedilol (COREG) 3.125 MG tablet, TAKE 1 TABLET (3.125 MG) BY MOUTH TWICE DAILY WITH MEALS, Disp: 180 tablet, Rfl: 3   citalopram (CELEXA) 20 MG tablet, Take 1 tablet (20 mg total) by mouth daily., Disp: 30 tablet, Rfl: 0   clonazePAM (KLONOPIN) 0.5 MG tablet, Take 1 tablet (0.5 mg total) by mouth 2 (two) times daily., Disp: 60 tablet, Rfl: 0   cyanocobalamin (VITAMIN B12) 1000 MCG tablet, Take 1,000 mcg by mouth daily., Disp: , Rfl:    dicyclomine (BENTYL) 20 MG tablet, Take 20 mg by mouth 3 (three) times daily before  meals., Disp: , Rfl:    fluticasone (FLONASE) 50 MCG/ACT nasal spray, Place 1 spray into both nostrils daily., Disp: , Rfl:    gabapentin (NEURONTIN) 100 MG capsule, Take 1 capsule (100 mg total) by mouth 2 (two) times daily., Disp: 180 capsule, Rfl: 3   levothyroxine (SYNTHROID) 88 MCG tablet, TAKE 1 TABLET(88 MCG) BY MOUTH DAILY BEFORE BREAKFAST, Disp: 90 tablet, Rfl: 3   memantine (NAMENDA) 5 MG tablet, Take 1 tablet (5 mg total) by mouth daily., Disp: 30 tablet, Rfl: 3   metolazone (ZAROXOLYN) 2.5 MG tablet, TAKE 1 TABLET BY MOUTH EVERY OTHER TUESDAY., Disp: 12 tablet, Rfl: 3   midodrine (PROAMATINE) 10 MG tablet, TAKE 1 TABLET THREE TIMES DAILY WITH MEALS, Disp: 270 tablet, Rfl: 3   Multiple Vitamin (MULTIVITAMIN WITH MINERALS) TABS tablet, Take 1 tablet by mouth daily., Disp: , Rfl:    potassium chloride SA (KLOR-CON M) 20 MEQ tablet, TAKE 2 TABLETS THREE TIMES DAILY. TAKE AN EXTRA 2 TABLETS ON DAYS YOU TAKE METOLAZONE, Disp: 572 tablet,  Rfl: 3   risperiDONE (RISPERDAL) 0.5 MG tablet, Take 1 tablet (0.5 mg total) by mouth 2 (two) times daily at 8 am and 4 pm., Disp: 60 tablet, Rfl: 0   simvastatin (ZOCOR) 10 MG tablet, TAKE 1 TABLET EVERY EVENING, Disp: 90 tablet, Rfl: 3   spironolactone (ALDACTONE) 25 MG tablet, TAKE 1 TABLET (25 MG TOTAL) BY MOUTH EVERY EVENING., Disp: 90 tablet, Rfl: 3   torsemide (DEMADEX) 20 MG tablet, Take 4 tablets (80 mg total) by mouth in the morning AND 3 tablets (60 mg total) every evening. NEEDS FOLLOW UP APPOINTMENT FOR ANYMORE REFILLS., Disp: 210 tablet, Rfl: 0   methylPREDNISolone (MEDROL DOSEPAK) 4 MG TBPK tablet, Take as directed (Patient not taking: Reported on 11/02/2022), Disp: 21 each, Rfl: 0 Allergies  Allergen Reactions   Ceftin Anaphylaxis    Face and throat swell    Cefuroxime Axetil Anaphylaxis    Face and throat swell   Geodon [Ziprasidone Hcl] Hives   Lisinopril Other (See Comments)    angioedema   Cefuroxime     Other reaction(s): anaphylaxis    Sulfacetamide Sodium-Sulfur     Other reaction(s): itch   Allopurinol Nausea Only and Other (See Comments)    weakness   Ativan [Lorazepam] Itching   Sulfa Antibiotics Itching   Valium [Diazepam] Other (See Comments)    Patient states that diazepam doesn't relax, it has the opposite effect.     Social History   Socioeconomic History   Marital status: Married    Spouse name: Not on file   Number of children: 2   Years of education: 16   Highest education level: Master's degree (e.g., MA, MS, MEng, MEd, MSW, MBA)  Occupational History   Occupation: retired  Tobacco Use   Smoking status: Some Days    Packs/day: 0.10    Years: 26.00    Additional pack years: 0.00    Total pack years: 2.60    Types: Cigarettes    Last attempt to quit: 03/23/2021    Years since quitting: 1.6   Smokeless tobacco: Never  Vaping Use   Vaping Use: Never used  Substance and Sexual Activity   Alcohol use: No   Drug use: No   Sexual activity: Yes  Other Topics Concern   Not on file  Social History Narrative   Tobacco Use Cigarettes: Former Smoker, Quit in 2008   No Alcohol   No recreational drug use   Diet: Regular/Low Carb   Exercise: None   Occupation: disabled   Education: Company secretary, masters   Children: 2   Firearms: No   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      Social Determinants of Health   Financial Resource Strain: Not on file  Food Insecurity: No Food Insecurity (08/11/2022)   Hunger Vital Sign    Worried About Running Out of Food in the Last Year: Never true    Ran Out of Food in the Last Year: Never true  Transportation Needs: No Transportation Needs (08/11/2022)   PRAPARE - Administrator, Civil Service (Medical): No    Lack of Transportation (Non-Medical): No  Physical Activity: Not on file  Stress: No Stress Concern Present (08/11/2022)   [name] of Occupational Health - Occupational Stress  Questionnaire    Feeling of Stress : Only a little  Social Connections: Not on file  Intimate Partner Violence: Not At Risk (07/27/2022)  Humiliation, Afraid, Rape, and Kick questionnaire    Fear of Current or Ex-Partner: No    Emotionally Abused: No    Physically Abused: No    Sexually Abused: No    Physical Exam      Future Appointments  Date Time Provider Department Center  11/12/2022  4:00 PM Arfeen, Phillips Grout, MD [facility] None  11/23/2022  7:30 AM [facility] DEVICE REMOTES [facility]  12/14/2022  7:10 AM [facility] DEVICE REMOTES [facility]  02/03/2023 10:45 AM Freddie Breech, DPM [facility]  03/15/2023  7:10 AM [facility] DEVICE REMOTES [facility]  06/02/2023  1:00 PM Rosann Auerbach, PhD [facility] None  06/14/2023  7:10 AM [facility] DEVICE REMOTES [facility]  06/29/2023  1:30 PM Nita Sickle K, DO [facility] None  09/13/2023  7:10 AM [facility] DEVICE REMOTES [facility]

## 2022-11-04 DIAGNOSIS — I130 Hypertensive heart and chronic kidney disease with heart failure and stage 1 through stage 4 chronic kidney disease, or unspecified chronic kidney disease: Secondary | ICD-10-CM | POA: Diagnosis not present

## 2022-11-04 DIAGNOSIS — I10 Essential (primary) hypertension: Secondary | ICD-10-CM | POA: Diagnosis not present

## 2022-11-04 DIAGNOSIS — E1140 Type 2 diabetes mellitus with diabetic neuropathy, unspecified: Secondary | ICD-10-CM | POA: Diagnosis not present

## 2022-11-04 DIAGNOSIS — Z79899 Other long term (current) drug therapy: Secondary | ICD-10-CM | POA: Diagnosis not present

## 2022-11-04 DIAGNOSIS — E785 Hyperlipidemia, unspecified: Secondary | ICD-10-CM | POA: Diagnosis not present

## 2022-11-04 DIAGNOSIS — M1000 Idiopathic gout, unspecified site: Secondary | ICD-10-CM | POA: Diagnosis not present

## 2022-11-04 DIAGNOSIS — M1990 Unspecified osteoarthritis, unspecified site: Secondary | ICD-10-CM | POA: Diagnosis not present

## 2022-11-04 DIAGNOSIS — I5032 Chronic diastolic (congestive) heart failure: Secondary | ICD-10-CM | POA: Diagnosis not present

## 2022-11-04 DIAGNOSIS — E039 Hypothyroidism, unspecified: Secondary | ICD-10-CM | POA: Diagnosis not present

## 2022-11-04 DIAGNOSIS — I951 Orthostatic hypotension: Secondary | ICD-10-CM | POA: Diagnosis not present

## 2022-11-04 DIAGNOSIS — K219 Gastro-esophageal reflux disease without esophagitis: Secondary | ICD-10-CM | POA: Diagnosis not present

## 2022-11-04 DIAGNOSIS — N1832 Chronic kidney disease, stage 3b: Secondary | ICD-10-CM | POA: Diagnosis not present

## 2022-11-04 DIAGNOSIS — I509 Heart failure, unspecified: Secondary | ICD-10-CM | POA: Diagnosis not present

## 2022-11-04 DIAGNOSIS — F331 Major depressive disorder, recurrent, moderate: Secondary | ICD-10-CM | POA: Diagnosis not present

## 2022-11-09 ENCOUNTER — Other Ambulatory Visit (HOSPITAL_COMMUNITY): Payer: Self-pay

## 2022-11-09 NOTE — Progress Notes (Signed)
Paramedicine Encounter    Patient ID: Rhonda [name], female    DOB: 1950/11/01, 72 y.o.   MRN: [id number]  Met with Rhonda [name] in the home today for a med review and pill box fill. She reports no complaints today and denied any current symptoms. She has been 100% compliant with her meds over the last week. No lower leg swelling. Weight up 2 lbs from last week.   We reviewed upcoming appointments and I wrote down same. She agreed with this and home visit complete.   Pill box filled for one week. We went through all meds and discarded expired (potassium) she had a tote of same and several were expired.   Home visit complete. I will see Rhonda [name] in one week.   Refills: Bentyl   ACTION: Home visit completed

## 2022-11-12 ENCOUNTER — Ambulatory Visit (HOSPITAL_BASED_OUTPATIENT_CLINIC_OR_DEPARTMENT_OTHER): Payer: Medicare HMO | Admitting: Psychiatry

## 2022-11-12 ENCOUNTER — Encounter (HOSPITAL_COMMUNITY): Payer: Self-pay | Admitting: Psychiatry

## 2022-11-12 ENCOUNTER — Other Ambulatory Visit: Payer: Self-pay

## 2022-11-12 VITALS — BP 118/79 | HR 83 | Ht 61.0 in | Wt 188.0 lb

## 2022-11-12 DIAGNOSIS — F411 Generalized anxiety disorder: Secondary | ICD-10-CM

## 2022-11-12 DIAGNOSIS — F330 Major depressive disorder, recurrent, mild: Secondary | ICD-10-CM

## 2022-11-12 DIAGNOSIS — R4189 Other symptoms and signs involving cognitive functions and awareness: Secondary | ICD-10-CM | POA: Diagnosis not present

## 2022-11-12 MED ORDER — RISPERIDONE 0.5 MG PO TABS: 0.5000 mg | ORAL_TABLET | ORAL | 1 refills | Status: DC

## 2022-11-12 MED ORDER — BUPROPION HCL ER (XL) 150 MG PO TB24: 150.0000 mg | ORAL_TABLET | Freq: Every day | ORAL | 0 refills | Status: DC

## 2022-11-12 MED ORDER — CLONAZEPAM 0.5 MG PO TABS: 0.5000 mg | ORAL_TABLET | Freq: Two times a day (BID) | ORAL | 1 refills | Status: DC

## 2022-11-12 MED ORDER — CITALOPRAM HYDROBROMIDE 20 MG PO TABS: 20.0000 mg | ORAL_TABLET | Freq: Every day | ORAL | 1 refills | Status: DC

## 2022-11-12 NOTE — Progress Notes (Signed)
BH MD/PA/NP OP Progress Note  11/12/2022 4:01 PM Rhonda [name]  MRN:  [id number]  Chief Complaint:  Chief Complaint  Patient presents with   Follow-up   HPI: Patient is 72 year old African-American retired, married female who was seen first time 6 weeks ago.  Patient was discharged from Hospital where she was treated for paranoia, delusions.  She was threatening to kill her husband.  Patient has dementia and lately memory has been declining.  She was discharged on multiple psychotropic medication.  We discontinued trazodone, Seroquel.  Patient is taking Celexa, Wellbutrin, risperidone and Klonopin.  Patient recently had a visit with her nephrologist and she was told to continue current medication.  Her husband is concerned because patient gets frustrated when he comes home from the work.  Patient works from 1230 to 9 PM.  Patient has home health aide 4 hours a week.  Patient admitted she feels boredom because she lives by herself and cannot do a lot of things by herself.  She not able to drive because of memory issues.  She has generalized weakness.  She sleeps okay but there are times when she feels restless.  Patient told she used to enjoy [employer], art, going to Bible studies but lately has not been able to do things.  Patient has 2 daughters but they are busy and patient not able to see them more frequently.  Since the medicines were adjusted and few of them are reduced she is more active and able to talk and not very sedated or groggy.  When she was hospitalized she was given Haldol, olanzapine because she was very agitated but lately her mood has been more calm but sad and frustrated because she cannot do a lot of things due to her physical condition.  She denies any hallucination, paranoia, suicidal thoughts or homicidal thoughts.  Her medicines were supervised by paramedics once a week.  Patient has multiple health issues including renal failure, CHF, neuropathy, headaches, memory impairment and  hypertension.  Her PCP is Dr. Chanetta Marshall at [facility].  Patient is awaiting referral for neurocognitive testing and appointment is in January.  Patient reported no tremors or shakes from the medication.  Visit Diagnosis:    ICD-10-CM   1. Major depressive disorder, recurrent episode, mild (HCC)  F33.0 buPROPion (WELLBUTRIN XL) 150 MG 24 hr tablet    citalopram (CELEXA) 20 MG tablet    clonazePAM (KLONOPIN) 0.5 MG tablet    risperiDONE (RISPERDAL) 0.5 MG tablet    2. Cognitive decline  R41.89 buPROPion (WELLBUTRIN XL) 150 MG 24 hr tablet    3. Generalized anxiety disorder  F41.1 citalopram (CELEXA) 20 MG tablet    clonazePAM (KLONOPIN) 0.5 MG tablet      Past Psychiatric History: Reviewed. History of depression, anxiety and seen by Dr. Clyde Lundborg for a few years but do not remember the details. History of inpatient in March 2024 due to severe psychotic and aggressive behavior. Patient was discharged on trazodone, Seroquel, risperidone, Celexa, Wellbutrin, Klonopin and Wellbutrin. She was also given olanzapine, Haldol for agitation. No history of suicidal attempt.   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    Complication of anesthesia    History of low blood pressure after surgery; attributed to lying flat   Diabetes mellitus    "diet controlled" (05/03/2013)   Exertional shortness of breath    Generalized anxiety disorder    GERD (gastroesophageal reflux disease)    Gout    Heart murmur     Hyperlipidemia    Hypertension    Hypokalemia 11/08/2018   Hypothyroidism    Left bundle branch block    s/p CRT-D (04/2013)   Major depressive disorder    Migraines    On home oxygen therapy    "2L suppose to be q night" (05/03/2013)   Orthostatic hypotension 07/28/2017   Peripheral neuropathy    Feet   SVD (spontaneous vaginal delivery)    x 2   Unspecified vitamin D deficiency 03/26/2011   Does not take meds    Past Surgical History:  Procedure Laterality Date   BI-VENTRICULAR IMPLANTABLE CARDIOVERTER DEFIBRILLATOR N/A 05/03/2013   Procedure: BI-VENTRICULAR IMPLANTABLE CARDIOVERTER DEFIBRILLATOR  (CRT-D);  Surgeon: Marinus Maw, MD;  Location: [facility] CATH LAB;  Service: Cardiovascular;  Laterality: N/A;   BI-VENTRICULAR IMPLANTABLE CARDIOVERTER DEFIBRILLATOR  (CRT-D)  05/03/2013   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   BIOPSY  05/31/2018   Procedure: BIOPSY;  Surgeon: Kerin Salen, MD;  Location: [facility];  Service: Gastroenterology;;   BIV ICD GENERATOR CHANGEOUT N/A 04/11/2020   Procedure: BIV ICD GENERATOR CHANGEOUT;  Surgeon: Marinus Maw, MD;  Location[facility] INVASIVE CV LAB;  Service: Cardiovascular;  Laterality: N/A;   BREAST LUMPECTOMY Left 07/28/2010   CARDIAC CATHETERIZATION  2010   NORMAL CORONARY ARTERIES   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--11 SURGERIES     PT HAS REMOVABLE SPEECH BULB-TAKES IT OUT BEFORE HER SURGERY   COLONOSCOPY     COLONOSCOPY N/A 05/31/2018   Procedure: COLONOSCOPY;  Surgeon: Kerin Salen, MD;  Location: [facility] ENDOSCOPY;  Service: Gastroenterology;  Laterality: N/A;   HYSTEROSCOPY WITH D & C  01/20/2012   Procedure: DILATATION AND CURETTAGE /HYSTEROSCOPY;  Surgeon: Meriel Pica, MD;  Location: [facility] ORS;  Service: Gynecology;  Laterality: N/A;  with trueclear   PORT-A-CATH REMOVAL  09/23/2011   Procedure: REMOVAL PORT-A-CATH;  Surgeon: Emelia Loron, MD;  Location: [facility] ORS;  Service: General;  Laterality: N/A;  Port  Removal   PORTACATH PLACEMENT  2012   TONSILLECTOMY  1960's   TOTAL KNEE ARTHROPLASTY  05/10/2012   Procedure: TOTAL KNEE ARTHROPLASTY;  Surgeon: Shelda Pal, MD;  Location: [facility] ORS;  Service: Orthopedics;  Laterality: Right;   WISDOM TOOTH EXTRACTION      Family Psychiatric History: Reviewed.  Family History:  Family History  Problem Relation Age of Onset   Alzheimer's disease Mother    CVA Mother    Hypertension Mother    Cancer - Colon Father        late 64   Birth defects Paternal Uncle     Social History:  Social History   Socioeconomic History   Marital status: Married    Spouse name: Not on file   Number of children: 2   Years of education: 18   Highest education level: Master's degree (e.g., MA, MS, MEng, MEd, MSW, MBA)  Occupational History  Occupation: retired  Tobacco Use   Smoking status: Some Days    Packs/day: 0.10    Years: 26.00    Additional pack years: 0.00    Total pack years: 2.60    Types: Cigarettes    Last attempt to quit: 03/23/2021    Years since quitting: 1.6   Smokeless tobacco: Never  Vaping Use   Vaping Use: Never used  Substance and Sexual Activity   Alcohol use: No   Drug use: No   Sexual activity: Yes  Other Topics Concern   Not on file  Social History Narrative   Tobacco Use Cigarettes: Former Smoker, Quit in 2008   No Alcohol   No recreational drug use   Diet: Regular/Low Carb   Exercise: None   Occupation: disabled   Education: Company secretary, masters   Children: 2   Firearms: No   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      Social Determinants of Health   Financial Resource Strain: Not on file  Food Insecurity: No Food Insecurity (08/11/2022)   Hunger Vital Sign    Worried About Running Out of Food in the Last Year: Never true    Ran Out of Food in the Last Year: Never true  Transportation Needs: No Transportation Needs (08/11/2022)   PRAPARE - Scientist, research (physical sciences) (Medical): No    Lack of Transportation (Non-Medical): No  Physical Activity: Not on file  Stress: No Stress Concern Present (08/11/2022)   [name] of Occupational Health - Occupational Stress Questionnaire    Feeling of Stress : Only a little  Social Connections: Not on file    Allergies:  Allergies  Allergen Reactions   Ceftin Anaphylaxis    Face and throat swell    Cefuroxime Axetil Anaphylaxis    Face and throat swell   Geodon [Ziprasidone Hcl] Hives   Lisinopril Other (See Comments)    angioedema   Cefuroxime     Other reaction(s): anaphylaxis   Sulfacetamide Sodium-Sulfur     Other reaction(s): itch   Allopurinol Nausea Only and Other (See Comments)    weakness   Ativan [Lorazepam] Itching   Sulfa Antibiotics Itching   Valium [Diazepam] Other (See Comments)    Patient states that diazepam doesn't relax, it has the opposite effect.    Metabolic Disorder Labs: Lab Results  Component Value Date   HGBA1C 7.0 (H) 10/09/2021   MPG 154.2 10/09/2021   MPG 151.33 10/08/2021   No results found for: "PROLACTIN" Lab Results  Component Value Date   CHOL 133 07/31/2022   TRIG 67 07/31/2022   HDL 51 07/31/2022   CHOLHDL 2.6 07/31/2022   VLDL 13 07/31/2022   LDLCALC 69 07/31/2022   LDLCALC  08/05/2009    92        Total Cholesterol/HDL:CHD Risk Coronary Heart Disease Risk Table                     Men   Women  1/2 Average Risk   3.4   3.3  Average Risk       5.0   4.4  2 X Average Risk   9.6   7.1  3 X Average Risk  23.4   11.0        Use the calculated Patient Ratio above and the CHD Risk Table to determine the patient's CHD Risk.  ATP III CLASSIFICATION (LDL):  <100     mg/dL   Optimal  161-096  mg/dL   Near or Above                    Optimal  130-159  mg/dL   Borderline  045-409  mg/dL   High  >811     mg/dL   Very High   Lab Results  Component Value Date   TSH 2.398 07/26/2022   TSH 1.127 10/08/2021    Therapeutic  Level Labs: No results found for: "LITHIUM" No results found for: "VALPROATE" No results found for: "CBMZ"  Current Medications: Current Outpatient Medications  Medication Sig Dispense Refill   acetaZOLAMIDE (DIAMOX) 125 MG tablet Take 1 tablet (125 mg total) by mouth daily. 90 tablet 3   Ascorbic Acid (VITAMIN C) 1000 MG tablet Take 1,000 mg by mouth daily.     buPROPion (WELLBUTRIN XL) 150 MG 24 hr tablet Take 1 tablet (150 mg total) by mouth daily. 30 tablet 0   carvedilol (COREG) 3.125 MG tablet TAKE 1 TABLET (3.125 MG) BY MOUTH TWICE DAILY WITH MEALS 180 tablet 3   citalopram (CELEXA) 20 MG tablet Take 1 tablet (20 mg total) by mouth daily. 30 tablet 0   clonazePAM (KLONOPIN) 0.5 MG tablet Take 1 tablet (0.5 mg total) by mouth 2 (two) times daily. 60 tablet 0   cyanocobalamin (VITAMIN B12) 1000 MCG tablet Take 1,000 mcg by mouth daily.     dicyclomine (BENTYL) 20 MG tablet Take 20 mg by mouth 3 (three) times daily before meals.     fluticasone (FLONASE) 50 MCG/ACT nasal spray Place 1 spray into both nostrils daily.     gabapentin (NEURONTIN) 100 MG capsule Take 1 capsule (100 mg total) by mouth 2 (two) times daily. 180 capsule 3   levothyroxine (SYNTHROID) 88 MCG tablet TAKE 1 TABLET(88 MCG) BY MOUTH DAILY BEFORE BREAKFAST 90 tablet 3   memantine (NAMENDA) 5 MG tablet Take 1 tablet (5 mg total) by mouth daily. 30 tablet 3   metolazone (ZAROXOLYN) 2.5 MG tablet TAKE 1 TABLET BY MOUTH EVERY OTHER TUESDAY. 12 tablet 3   midodrine (PROAMATINE) 10 MG tablet TAKE 1 TABLET THREE TIMES DAILY WITH MEALS 270 tablet 3   Multiple Vitamin (MULTIVITAMIN WITH MINERALS) TABS tablet Take 1 tablet by mouth daily.     potassium chloride SA (KLOR-CON M) 20 MEQ tablet TAKE 2 TABLETS THREE TIMES DAILY. TAKE AN EXTRA 2 TABLETS ON DAYS YOU TAKE METOLAZONE 572 tablet 3   risperiDONE (RISPERDAL) 0.5 MG tablet Take 1 tablet (0.5 mg total) by mouth 2 (two) times daily at 8 am and 4 pm. 60 tablet 0   simvastatin  (ZOCOR) 10 MG tablet TAKE 1 TABLET EVERY EVENING 90 tablet 3   spironolactone (ALDACTONE) 25 MG tablet TAKE 1 TABLET (25 MG TOTAL) BY MOUTH EVERY EVENING. 90 tablet 3   torsemide (DEMADEX) 20 MG tablet Take 4 tablets (80 mg total) by mouth in the morning AND 3 tablets (60 mg total) every evening. NEEDS FOLLOW UP APPOINTMENT FOR ANYMORE REFILLS. 210 tablet 0   methylPREDNISolone (MEDROL DOSEPAK) 4 MG TBPK tablet Take as directed (Patient not taking: Reported on 11/02/2022) 21 each 0   No current facility-administered medications for this visit.     Musculoskeletal: Strength & Muscle Tone: decreased Gait & Station: unsteady Patient leans:  see above  Psychiatric Specialty Exam: Review of Systems  Constitutional:  Positive for fatigue.  Psychiatric/Behavioral:  Positive  for decreased concentration, dysphoric mood and sleep disturbance. The patient is nervous/anxious.     Blood pressure 118/79, pulse 83, height 5\' 1"  (1.549 m), weight 188 lb (85.3 kg).Body mass index is 35.52 kg/m.  General Appearance: Casual  Eye Contact:  Fair  Speech:  Slow  Volume:  Decreased  Mood:  Dysphoric  Affect:  Constricted  Thought Process:  Descriptions of Associations: Intact  Orientation:  Full (Time, Place, and Person)  Thought Content: Rumination   Suicidal Thoughts:  No  Homicidal Thoughts:  No  Memory:  Immediate;   Fair Recent;   Fair Remote;   Fair  Judgement:  Fair  Insight:  Fair  Psychomotor Activity:  Decreased  Concentration:  Concentration: Fair and Attention Span: Fair  Recall:  [employer] of Knowledge: Fair  Language: Good  Akathisia:  No  Handed:  Right  AIMS (if indicated): not done  Assets:  Communication Skills Desire for Improvement Housing Social Support  ADL's:  Intact  Cognition: Impaired,  Mild  Sleep:  Fair   Screenings: AUDIT    Flowsheet Row Admission (Discharged) from 07/27/2022 in [facility] BEHAVIORAL MEDICINE  Alcohol Use Disorder Identification Test  Final Score (AUDIT) 0      PHQ2-9    Flowsheet Row Care Coordination from 08/11/2022 in [facility] Care Coordination  PHQ-2 Total Score 1      Flowsheet Row ED from 09/03/2022 in [facility] Admission (Discharged) from 07/27/2022 in [facility] BEHAVIORAL [facility]ICINE ED from 07/26/2022 in [facility] at [place]  C-SSRS RISK CATEGORY No Risk No Risk No Risk        Assessment and Plan: Patient is 72 year old African-American female with multiple health issues seen in our office for depression, cognitive decline, generalized anxiety disorder and history of psychosis.  I reviewed current medication with the patient and her husband.  So far no major side effects.  Her behavior is under control and she is not violent, aggressive and denies any hallucination or any paranoia.  I did talk to the patient and her husband that she need to do some activities as patient complained of boredom and getting frustrated all day.  She used to go to the [facility] [employer] [employer] [facility] and I encouraged to consider going back again and maybe consider doing some art work at home which she always enjoyed.  Patient used to go to church but lately not going.  I encourage consider church activities including Bible studies and woman's group.  I also encouraged her to include the daughter and the care so the husband can some relief.  Discussed getting more home health hours.  I explain she already taking multiple medication and adding more medication may not help as such and may cause side effects.  Patient has chronic renal insufficiency, hypertension.  Patient agrees with the plan.  Continue Wellbutrin XL 150 mg daily, Klonopin 0.5 mg twice a day, Celexa 20 mg daily and risperidone 0.5 mg twice a day.  Will follow up in 2 months.  Recommend to call us back if she feels worsening of the symptoms.  Collaboration of Care: Collaboration of Care: Other provider  involved in patient's care AEB notes are available in epic to review.  Patient/Guardian was advised Release of Information must be obtained prior to any record release in order to collaborate their care with an outside provider. Patient/Guardian was advised if they have not already done so to contact the registration  department to sign all necessary forms in order for [place] to release information regarding their care.   Consent: Patient/Guardian gives verbal consent for treatment and assignment of benefits for services provided during this visit. Patient/Guardian expressed understanding and agreed to proceed.    Cleotis Nipper, MD 11/12/2022, 4:01 PM

## 2022-11-16 ENCOUNTER — Other Ambulatory Visit (HOSPITAL_COMMUNITY): Payer: Self-pay

## 2022-11-16 NOTE — Progress Notes (Signed)
Paramedicine Encounter    Patient ID: Rhonda [name], female    DOB: 1950-11-28, 72 y.o.   MRN: [id number]   Met with Kynzley in the home today for a med rec visit. I reviewed all meds and filled pill box for one week. She was seen on Friday for visit with Dr. Lolly Mustache. Vitals and weight assessed then and all within range for her. She has no complaints today reporting she had a great weekend for her birthday and plans on getting out and about more to help with depression. Family asked about transport resources I will get a SCAT application. They appreciated the help. We reviewed appointments. She has some complaints about left foot pain- feels like gout again. She has prednisone left from last flare up. Will use same until Friday if no improvements we will call PCP. They agreed with plans. We reviewed diet and HF management, she agreed. Visit complete.   Refills as noted: -Levothyroxine -Dicyclomine -Clonzepam  -Torsemide -Risperidone      Patient Care Team: Shon Hale, MD as PCP - General (Family Medicine) Burna Sis, LCSW as Social Worker (Licensed Clinical Social Worker) [name], Noberto Retort, DO as Consulting Physician (Neurology) [name], Paramedic as Paramedic Chanetta Marshall, Meridee Score, MD as Attending Physician (Family Medicine) Clinton Gallant, RN as [facility] Care Management Bensimhon, Bevelyn Buckles, MD as Consulting Physician (Cardiology) Lolly Mustache Phillips Grout, MD as Consulting Physician (Psychiatry) Candelaria Stagers, DPM as Consulting Physician (Podiatry)  Patient Active Problem List   Diagnosis Date Noted   Major depressive disorder, recurrent episode, severe (HCC) 07/27/2022   MDD (major depressive disorder), recurrent severe, without psychosis (HCC) 07/27/2022   MDD (major depressive disorder) 07/27/2022   Elevated troponin 12/02/2021   Nausea & vomiting 12/01/2021   Acute left ankle pain 10/11/2021   Intractable nausea and vomiting 10/08/2021   AKI  (acute kidney injury) (HCC) 10/08/2021   Hyponatremia 10/08/2021   Decreased estrogen level 08/01/2021   Dementia (HCC) 08/01/2021   Frail elderly 08/01/2021   Hyperparathyroidism (HCC) 08/01/2021   Spondylolisthesis 08/01/2021   Varicose veins of bilateral lower extremities with other complications 08/01/2021   Lumbago without sciatica 04/30/2021   Abnormal gait 06/11/2020   Adult failure to thrive syndrome 06/11/2020   Allergic rhinitis 06/11/2020   Anxiety 06/11/2020   Asthma 06/11/2020   Benign intracranial hypertension 06/11/2020   Body mass index (BMI) 45.0-49.9, adult (HCC) 06/11/2020   Bowel incontinence 06/11/2020   Cholelithiasis 06/11/2020   Chronic sinusitis 06/11/2020   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   Daytime somnolence 06/11/2020   Diarrhea of presumed infectious origin 06/11/2020   Edema 06/11/2020   Family history of malignant neoplasm of gastrointestinal tract 06/11/2020   Gout 06/11/2020   History of fall 06/11/2020   History of infectious disease 06/11/2020   Insomnia 06/11/2020   Irregular bowel habits 06/11/2020   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   Lumbar spondylosis with myelopathy 06/11/2020   Macrocytosis 06/11/2020   Mild recurrent major depression (HCC) 06/11/2020   Personal history of colonic polyps 06/11/2020   Personal history of malignant neoplasm of breast 06/11/2020   Repeated falls 06/11/2020   Mixed anxiety and depressive disorder 06/11/2020   Irritable bowel syndrome 01/04/2020   Spinal stenosis of lumbar region 01/03/2020   Other symptoms and signs involving the musculoskeletal system 09/11/2019   Bilateral leg weakness 08/01/2019   Leg swelling 08/01/2019   Diabetes mellitus with neuropathy (HCC) 06/15/2019   Hypokalemia 11/08/2018   CKD (chronic kidney disease) stage  3, GFR 30-59 ml/min (HCC) 11/08/2018   Orthostatic hypotension 07/28/2017   SVD (spontaneous vaginal delivery)    Peripheral neuropathy    On home oxygen therapy    Migraines     Left bundle branch block    Hypothyroidism    Hypertension    Hyperlipidemia    Heart murmur    GERD (gastroesophageal reflux disease)    Exertional shortness of breath    Major depressive disorder    Back pain    Arthritis    Generalized anxiety disorder    Anemia    Chronic respiratory failure (HCC) 09/14/2013   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/04/2013   Morbid obesity (HCC) 11/01/2012   Chronic systolic heart failure (HCC) 10/27/2012   Endometrial polyp 01/20/2012   Malignant tumor of breast (HCC) 03/26/2011   Unspecified vitamin D deficiency 03/26/2011    Current Outpatient Medications:    acetaZOLAMIDE (DIAMOX) 125 MG tablet, Take 1 tablet (125 mg total) by mouth daily., Disp: 90 tablet, Rfl: 3   Ascorbic Acid (VITAMIN C) 1000 MG tablet, Take 1,000 mg by mouth daily., Disp: , Rfl:    buPROPion (WELLBUTRIN XL) 150 MG 24 hr tablet, Take 1 tablet (150 mg total) by mouth daily., Disp: 30 tablet, Rfl: 0   carvedilol (COREG) 3.125 MG tablet, TAKE 1 TABLET (3.125 MG) BY MOUTH TWICE DAILY WITH MEALS, Disp: 180 tablet, Rfl: 3   citalopram (CELEXA) 20 MG tablet, Take 1 tablet (20 mg total) by mouth daily., Disp: 30 tablet, Rfl: 1   clonazePAM (KLONOPIN) 0.5 MG tablet, Take 1 tablet (0.5 mg total) by mouth 2 (two) times daily., Disp: 60 tablet, Rfl: 1   cyanocobalamin (VITAMIN B12) 1000 MCG tablet, Take 1,000 mcg by mouth daily., Disp: , Rfl:    dicyclomine (BENTYL) 20 MG tablet, Take 20 mg by mouth 3 (three) times daily before meals., Disp: , Rfl:    fluticasone (FLONASE) 50 MCG/ACT nasal spray, Place 1 spray into both nostrils daily., Disp: , Rfl:    gabapentin (NEURONTIN) 100 MG capsule, Take 1 capsule (100 mg total) by mouth 2 (two) times daily., Disp: 180 capsule, Rfl: 3   levothyroxine (SYNTHROID) 88 MCG tablet, TAKE 1 TABLET(88 MCG) BY MOUTH DAILY BEFORE BREAKFAST, Disp: 90 tablet, Rfl: 3   memantine (NAMENDA) 5 MG tablet, Take 1 tablet (5 mg  total) by mouth daily., Disp: 30 tablet, Rfl: 3   methylPREDNISolone (MEDROL DOSEPAK) 4 MG TBPK tablet, Take as directed (Patient not taking: Reported on 11/02/2022), Disp: 21 each, Rfl: 0   metolazone (ZAROXOLYN) 2.5 MG tablet, TAKE 1 TABLET BY MOUTH EVERY OTHER TUESDAY., Disp: 12 tablet, Rfl: 3   midodrine (PROAMATINE) 10 MG tablet, TAKE 1 TABLET THREE TIMES DAILY WITH MEALS, Disp: 270 tablet, Rfl: 3   Multiple Vitamin (MULTIVITAMIN WITH MINERALS) TABS tablet, Take 1 tablet by mouth daily., Disp: , Rfl:    potassium chloride SA (KLOR-CON M) 20 MEQ tablet, TAKE 2 TABLETS THREE TIMES DAILY. TAKE AN EXTRA 2 TABLETS ON DAYS YOU TAKE METOLAZONE, Disp: 572 tablet, Rfl: 3   risperiDONE (RISPERDAL) 0.5 MG tablet, Take 1 tablet (0.5 mg total) by mouth 2 (two) times daily at 8 am and 4 pm., Disp: 60 tablet, Rfl: 1   simvastatin (ZOCOR) 10 MG tablet, TAKE 1 TABLET EVERY EVENING, Disp: 90 tablet, Rfl: 3   spironolactone (ALDACTONE) 25 MG tablet, TAKE 1 TABLET (25 MG TOTAL) BY MOUTH EVERY EVENING., Disp: 90 tablet, Rfl: 3   torsemide (DEMADEX) 20  MG tablet, Take 4 tablets (80 mg total) by mouth in the morning AND 3 tablets (60 mg total) every evening. NEEDS FOLLOW UP APPOINTMENT FOR ANYMORE REFILLS., Disp: 210 tablet, Rfl: 0 Allergies  Allergen Reactions   Ceftin Anaphylaxis    Face and throat swell    Cefuroxime Axetil Anaphylaxis    Face and throat swell   Geodon [Ziprasidone Hcl] Hives   Lisinopril Other (See Comments)    angioedema   Cefuroxime     Other reaction(s): anaphylaxis   Sulfacetamide Sodium-Sulfur     Other reaction(s): itch   Allopurinol Nausea Only and Other (See Comments)    weakness   Ativan [Lorazepam] Itching   Sulfa Antibiotics Itching   Valium [Diazepam] Other (See Comments)    Patient states that diazepam doesn't relax, it has the opposite effect.     Social History   Socioeconomic History   Marital status: Married    Spouse name: Not on file   Number of children: 2    Years of education: 26   Highest education level: Master's degree (e.g., MA, MS, MEng, MEd, MSW, MBA)  Occupational History   Occupation: retired  Tobacco Use   Smoking status: Some Days    Packs/day: 0.10    Years: 26.00    Additional pack years: 0.00    Total pack years: 2.60    Types: Cigarettes    Last attempt to quit: 03/23/2021    Years since quitting: 1.6   Smokeless tobacco: Never  Vaping Use   Vaping Use: Never used  Substance and Sexual Activity   Alcohol use: No   Drug use: No   Sexual activity: Yes  Other Topics Concern   Not on file  Social History Narrative   Tobacco Use Cigarettes: Former Smoker, Quit in 2008   No Alcohol   No recreational drug use   Diet: Regular/Low Carb   Exercise: None   Occupation: disabled   Education: Company secretary, masters   Children: 2   Firearms: No   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      Social Determinants of Health   Financial Resource Strain: Not on file  Food Insecurity: No Food Insecurity (08/11/2022)   Hunger Vital Sign    Worried About Running Out of Food in the Last Year: Never true    Ran Out of Food in the Last Year: Never true  Transportation Needs: No Transportation Needs (08/11/2022)   PRAPARE - Administrator, Civil Service (Medical): No    Lack of Transportation (Non-Medical): No  Physical Activity: Not on file  Stress: No Stress Concern Present (08/11/2022)   [name] of Occupational Health - Occupational Stress Questionnaire    Feeling of Stress : Only a little  Social Connections: Not on file  Intimate Partner Violence: Not At Risk (07/27/2022)   Humiliation, Afraid, Rape, and Kick questionnaire    Fear of Current or Ex-Partner: No    Emotionally Abused: No    Physically Abused: No    Sexually Abused: No    Physical Exam      Future Appointments  Date Time Provider Department Center  11/23/2022  7:30 AM [facility] DEVICE REMOTES  [facility]  12/02/2022  1:30 PM [facility] PA/NP [facility] None  12/14/2022  7:10 AM [facility] DEVICE REMOTES [facility]  01/28/2023  4:20 PM Arfeen, Phillips Grout, MD [facility] None  02/03/2023 10:45 AM Geralynn Rile  L, DPM [facility]  03/15/2023  7:10 AM [facility] DEVICE REMOTES [facility]  06/02/2023  1:00 PM Rosann Auerbach, PhD [facility] None  06/14/2023  7:10 AM [facility] DEVICE REMOTES [facility]  06/29/2023  1:30 PM Nita Sickle K, DO [facility] None  09/13/2023  7:10 AM [facility] DEVICE REMOTES [facility]     ACTION: Home visit completed

## 2022-11-17 ENCOUNTER — Other Ambulatory Visit (HOSPITAL_COMMUNITY): Payer: Self-pay

## 2022-11-17 MED ORDER — TORSEMIDE 20 MG PO TABS: ORAL_TABLET | ORAL | 0 refills | Status: DC

## 2022-11-23 ENCOUNTER — Ambulatory Visit: Payer: Medicare HMO | Attending: Internal Medicine

## 2022-11-23 ENCOUNTER — Other Ambulatory Visit (HOSPITAL_COMMUNITY): Payer: Self-pay

## 2022-11-23 DIAGNOSIS — Z95810 Presence of automatic (implantable) cardiac defibrillator: Secondary | ICD-10-CM | POA: Diagnosis not present

## 2022-11-23 DIAGNOSIS — I5022 Chronic systolic (congestive) heart failure: Secondary | ICD-10-CM | POA: Diagnosis not present

## 2022-11-23 NOTE — Progress Notes (Signed)
Paramedicine Encounter    Patient ID: Rhonda [name], female    DOB: 11/04/1950, 72 y.o.   MRN: [id number]   Complaints- sore throat and sinus congestion-chronic for Rhonda [name]   Assessment- CAOX4, warm and dry, lungs clear, no lower leg edema, vitals obtained within normal limits, down three lbs since last week   Compliance with meds- three noon doses missed   Pill box filled- for one week   Refills needed- Risperidone   Meds changes since last visit- none     Social changes- none    BP 110/70   Pulse 88   Resp 16   Wt 185 lb (83.9 kg)   SpO2 98%   BMI 34.96 kg/m  Weight yesterday-- didn't weigh  Last visit weight-- 188lbs   Arrived for home visit for Rhonda [name] who reports to be feeling okay other than some sinus congestion and drainage causing a sore throat- this is chronic for Rhonda [name] due to her cleft palate. She is currently not wearing her implant or her dentures. She reports no shortness of breath, dizziness, chest pain or swelling over the last week. She has been mostly compliant with her meds only missing three doses of noon doses. I reviewed meds and filled pill box for one week. Assessment and vitals as noted. We reviewed HF management with salt and fluid restrictions discussed. She reports having chinese food over the weekend including wonton soup- but only ate half of the container and reports to have diluted it with water. I reminded her the importance of adhering to her diet. She agreed. We reviewed upcoming appointments. I will come out for home visit in one week and follow up in clinic next week. She agreed. Visit complete.   Rhonda [name], EMT-Paramedic [phone]  ACTION: Home visit completed    Patient Care Team: Shon Hale, MD as PCP - General (Family Medicine) Burna Sis, LCSW as Social Worker (Licensed Clinical Social Worker) [name], Noberto Retort, DO as Consulting Physician (Neurology) Rhonda [name], Paramedic as Paramedic Chanetta Marshall,  Meridee Score, MD as Attending Physician (Family Medicine) Clinton Gallant, RN as [facility] Care Management Bensimhon, Bevelyn Buckles, MD as Consulting Physician (Cardiology) Lolly Mustache, Phillips Grout, MD as Consulting Physician (Psychiatry) Candelaria Stagers, DPM as Consulting Physician (Podiatry)  Patient Active Problem List   Diagnosis Date Noted   Major depressive disorder, recurrent episode, severe (HCC) 07/27/2022   MDD (major depressive disorder), recurrent severe, without psychosis (HCC) 07/27/2022   MDD (major depressive disorder) 07/27/2022   Elevated troponin 12/02/2021   Nausea & vomiting 12/01/2021   Acute left ankle pain 10/11/2021   Intractable nausea and vomiting 10/08/2021   AKI (acute kidney injury) (HCC) 10/08/2021   Hyponatremia 10/08/2021   Decreased estrogen level 08/01/2021   Dementia (HCC) 08/01/2021   Frail elderly 08/01/2021   Hyperparathyroidism (HCC) 08/01/2021   Spondylolisthesis 08/01/2021   Varicose veins of bilateral lower extremities with other complications 08/01/2021   Lumbago without sciatica 04/30/2021   Abnormal gait 06/11/2020   Adult failure to thrive syndrome 06/11/2020   Allergic rhinitis 06/11/2020   Anxiety 06/11/2020   Asthma 06/11/2020   Benign intracranial hypertension 06/11/2020   Body mass index (BMI) 45.0-49.9, adult (HCC) 06/11/2020   Bowel incontinence 06/11/2020   Cholelithiasis 06/11/2020   Chronic sinusitis 06/11/2020   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   Daytime somnolence 06/11/2020   Diarrhea of presumed infectious origin 06/11/2020   Edema 06/11/2020   Family history of malignant neoplasm of gastrointestinal tract 06/11/2020  Gout 06/11/2020   History of fall 06/11/2020   History of infectious disease 06/11/2020   Insomnia 06/11/2020   Irregular bowel habits 06/11/2020   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   Lumbar spondylosis with myelopathy 06/11/2020   Macrocytosis 06/11/2020   Mild recurrent major depression (HCC) 06/11/2020    Personal history of colonic polyps 06/11/2020   Personal history of malignant neoplasm of breast 06/11/2020   Repeated falls 06/11/2020   Mixed anxiety and depressive disorder 06/11/2020   Irritable bowel syndrome 01/04/2020   Spinal stenosis of lumbar region 01/03/2020   Other symptoms and signs involving the musculoskeletal system 09/11/2019   Bilateral leg weakness 08/01/2019   Leg swelling 08/01/2019   Diabetes mellitus with neuropathy (HCC) 06/15/2019   Hypokalemia 11/08/2018   CKD (chronic kidney disease) stage 3, GFR 30-59 ml/min (HCC) 11/08/2018   Orthostatic hypotension 07/28/2017   SVD (spontaneous vaginal delivery)    Peripheral neuropathy    On home oxygen therapy    Migraines    Left bundle branch block    Hypothyroidism    Hypertension    Hyperlipidemia    Heart murmur    GERD (gastroesophageal reflux disease)    Exertional shortness of breath    Major depressive disorder    Back pain    Arthritis    Generalized anxiety disorder    Anemia    Chronic respiratory failure (HCC) 09/14/2013   Biventricular ICD (implantable cardioverter-defibrillator) in place 08/04/2013   Morbid obesity (HCC) 11/01/2012   Chronic systolic heart failure (HCC) 10/27/2012   Endometrial polyp 01/20/2012   Malignant tumor of breast (HCC) 03/26/2011   Unspecified vitamin D deficiency 03/26/2011    Current Outpatient Medications:    acetaZOLAMIDE (DIAMOX) 125 MG tablet, Take 1 tablet (125 mg total) by mouth daily., Disp: 90 tablet, Rfl: 3   Ascorbic Acid (VITAMIN C) 1000 MG tablet, Take 1,000 mg by mouth daily., Disp: , Rfl:    buPROPion (WELLBUTRIN XL) 150 MG 24 hr tablet, Take 1 tablet (150 mg total) by mouth daily., Disp: 30 tablet, Rfl: 0   carvedilol (COREG) 3.125 MG tablet, TAKE 1 TABLET (3.125 MG) BY MOUTH TWICE DAILY WITH MEALS, Disp: 180 tablet, Rfl: 3   citalopram (CELEXA) 20 MG tablet, Take 1 tablet (20 mg total) by mouth daily., Disp: 30 tablet, Rfl: 1   clonazePAM (KLONOPIN)  0.5 MG tablet, Take 1 tablet (0.5 mg total) by mouth 2 (two) times daily., Disp: 60 tablet, Rfl: 1   cyanocobalamin (VITAMIN B12) 1000 MCG tablet, Take 1,000 mcg by mouth daily., Disp: , Rfl:    dicyclomine (BENTYL) 20 MG tablet, Take 20 mg by mouth 3 (three) times daily before meals., Disp: , Rfl:    fluticasone (FLONASE) 50 MCG/ACT nasal spray, Place 1 spray into both nostrils daily., Disp: , Rfl:    gabapentin (NEURONTIN) 100 MG capsule, Take 1 capsule (100 mg total) by mouth 2 (two) times daily., Disp: 180 capsule, Rfl: 3   levothyroxine (SYNTHROID) 88 MCG tablet, TAKE 1 TABLET(88 MCG) BY MOUTH DAILY BEFORE BREAKFAST, Disp: 90 tablet, Rfl: 3   memantine (NAMENDA) 5 MG tablet, Take 1 tablet (5 mg total) by mouth daily., Disp: 30 tablet, Rfl: 3   methylPREDNISolone (MEDROL DOSEPAK) 4 MG TBPK tablet, Take as directed, Disp: 21 each, Rfl: 0   metolazone (ZAROXOLYN) 2.5 MG tablet, TAKE 1 TABLET BY MOUTH EVERY OTHER TUESDAY., Disp: 12 tablet, Rfl: 3   midodrine (PROAMATINE) 10 MG tablet, TAKE 1 TABLET THREE TIMES DAILY WITH MEALS,  Disp: 270 tablet, Rfl: 3   Multiple Vitamin (MULTIVITAMIN WITH MINERALS) TABS tablet, Take 1 tablet by mouth daily., Disp: , Rfl:    potassium chloride SA (KLOR-CON M) 20 MEQ tablet, TAKE 2 TABLETS THREE TIMES DAILY. TAKE AN EXTRA 2 TABLETS ON DAYS YOU TAKE METOLAZONE, Disp: 572 tablet, Rfl: 3   risperiDONE (RISPERDAL) 0.5 MG tablet, Take 1 tablet (0.5 mg total) by mouth 2 (two) times daily at 8 am and 4 pm., Disp: 60 tablet, Rfl: 1   simvastatin (ZOCOR) 10 MG tablet, TAKE 1 TABLET EVERY EVENING, Disp: 90 tablet, Rfl: 3   spironolactone (ALDACTONE) 25 MG tablet, TAKE 1 TABLET (25 MG TOTAL) BY MOUTH EVERY EVENING., Disp: 90 tablet, Rfl: 3   torsemide (DEMADEX) 20 MG tablet, Take 4 tablets (80 mg total) by mouth in the morning AND 3 tablets (60 mg total) every evening. NEEDS FOLLOW UP APPOINTMENT FOR ANYMORE REFILLS., Disp: 210 tablet, Rfl: 0 Allergies  Allergen Reactions    Ceftin Anaphylaxis    Face and throat swell    Cefuroxime Axetil Anaphylaxis    Face and throat swell   Geodon [Ziprasidone Hcl] Hives   Lisinopril Other (See Comments)    angioedema   Cefuroxime     Other reaction(s): anaphylaxis   Sulfacetamide Sodium-Sulfur     Other reaction(s): itch   Allopurinol Nausea Only and Other (See Comments)    weakness   Ativan [Lorazepam] Itching   Sulfa Antibiotics Itching   Valium [Diazepam] Other (See Comments)    Patient states that diazepam doesn't relax, it has the opposite effect.     Social History   Socioeconomic History   Marital status: Married    Spouse name: Not on file   Number of children: 2   Years of education: 62   Highest education level: Master's degree (e.g., MA, MS, MEng, MEd, MSW, MBA)  Occupational History   Occupation: retired  Tobacco Use   Smoking status: Some Days    Packs/day: 0.10    Years: 26.00    Additional pack years: 0.00    Total pack years: 2.60    Types: Cigarettes    Last attempt to quit: 03/23/2021    Years since quitting: 1.6   Smokeless tobacco: Never  Vaping Use   Vaping Use: Never used  Substance and Sexual Activity   Alcohol use: No   Drug use: No   Sexual activity: Yes  Other Topics Concern   Not on file  Social History Narrative   Tobacco Use Cigarettes: Former Smoker, Quit in 2008   No Alcohol   No recreational drug use   Diet: Regular/Low Carb   Exercise: None   Occupation: disabled   Education: Company secretary, masters   Children: 2   Firearms: No   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      Social Determinants of Health   Financial Resource Strain: Not on file  Food Insecurity: No Food Insecurity (08/11/2022)   Hunger Vital Sign    Worried About Running Out of Food in the Last Year: Never true    Ran Out of Food in the Last Year: Never true  Transportation Needs: No Transportation Needs (08/11/2022)   PRAPARE - Therapist, art (Medical): No    Lack of Transportation (Non-Medical): No  Physical Activity: Not on file  Stress: No Stress Concern Present (08/11/2022)   [name] of  Occupational Health - Occupational Stress Questionnaire    Feeling of Stress : Only a little  Social Connections: Not on file  Intimate Partner Violence: Not At Risk (07/27/2022)   Humiliation, Afraid, Rape, and Kick questionnaire    Fear of Current or Ex-Partner: No    Emotionally Abused: No    Physically Abused: No    Sexually Abused: No    Physical Exam      Future Appointments  Date Time Provider Department Center  12/02/2022  1:30 PM [facility] PA/NP [facility] None  12/14/2022  7:10 AM [facility] DEVICE REMOTES [facility]  01/28/2023  4:20 PM Arfeen, Phillips Grout, MD [facility] None  02/03/2023 10:45 AM Freddie Breech, DPM [facility]  03/15/2023  7:10 AM [facility] DEVICE REMOTES [facility]  06/02/2023  1:00 PM Rosann Auerbach, PhD [facility] None  06/02/2023  2:00 PM [facility] TECH [facility] None  06/09/2023  2:30 PM Rosann Auerbach, PhD [facility] None  06/14/2023  7:10 AM [facility] DEVICE REMOTES [facility]  06/29/2023  1:30 PM Nita Sickle K, DO [facility] None  09/13/2023  7:10 AM [facility] DEVICE REMOTES [facility]

## 2022-11-25 NOTE — Progress Notes (Signed)
EPIC Encounter for ICM Monitoring  Patient Name: Rhonda [name] is a 72 y.o. female Date: 11/25/2022 Primary Care Physican: Shon Hale, MD Primary Cardiologist: Bensimhon Electrophysiologist: Rae Roam Pacing: 98.5%   06/01/2022 Weight: 193 lbs  07/08/2022 Weight: 183 lbs 08/10/2022 Weight: 185 lbs 11/25/2022 Weight: 180 lbs         Spoke with husband and heart failure questions reviewed.  Transmission results reviewed.  He reports patient felt okay during decreased impedance and currently doing fine.                Optivol thoracic impedance suggesting normal fluid levels with exception of possible fluid accumulation 6/1-6/23.   Prescribed:  Patient participating in paramedicine program to assist with meds. Torsemide 20 mg 4 tablets (80 mg total) by mouth every morning and 3 tablets (60 mg total) every afternoon.   Potassium 20 meq 2 tablets (40 mEq total) by mouth 3 (three) times daily.  Take extra 2 tablets on days you take Metolazone.   Metolazone 2.5mg  1 tablet every other Tuesday.  (dosage due 12/29/22) Spironolactone 25 mg take 1 tablet by mouth every evening.   Labs: 08/01/2022 Creatinine 1.47, BUN 42, Potassium 3.1, Sodium 136, GFR 38  07/26/2022 Creatinine 2.02, BUN 25, Potassium 4.6, Sodium 133, GFR 26  07/22/2022 Creatinine 1.57, BUN 20, Potassium 3.8, Sodium 136, GFR 35  07/14/2022 Creatinine 1.32, BUN 20, Potassium 4.2, Sodium 138, GFR 44 06/25/2022 Creatinine 2.19, BUN 29, Potassium 3.5, Sodium 134, GFR 24 A complete set of results can be found in Results Review.   Recommendations:  No changes and encouraged to call if experiencing any fluid symptoms.   Follow-up plan: ICM clinic phone appointment on 12/30/2022.   91 day device clinic remote transmission 12/14/2022.     EP/Cardiology Office Visits:  12/02/2022 with HF clinic.  Recall 03/20/2023 with Dr Ladona Ridgel.     Copy of ICM check sent to Dr. Ladona Ridgel.   3 month ICM trend: 11/23/2022.    12-14 Month ICM trend:      Karie Soda, RN 11/25/2022 10:23 AM

## 2022-11-30 ENCOUNTER — Other Ambulatory Visit (HOSPITAL_COMMUNITY): Payer: Self-pay

## 2022-11-30 NOTE — Progress Notes (Signed)
Paramedicine Encounter    Patient ID: Rhonda [name], female    DOB: [birth year], 72 y.o.   MRN: [id number]   Met with Aerion today in the home who reports to be feeling okay today. She says over the weekend she had a lot of depression and anxiety as well as what she thought we're bugs in her bed because she had some bites on her back but they did not visibly see any bugs on her or in her bed or in her home. She has a few spots on her back left mid spine but do not appear recent. She has been applying cortisone cream to same with some relief. She plans to be seen in HF clinic this week. She denied any swelling, shortness of breath, chest pain or dizziness. No lower leg edema noted. I reviewed meds and confirmed same filling pill box. We plan on seeing each other at clinic this week. Home visit complete.   Refills called into [facility] Risperidone Nam[facility]  (Husband aware and will pick up)   [name], EMT-Paramedic [phone] 11/30/2022     Patient Care Team: Shon Hale, MD as PCP - General (Family Medicine) Burna Sis, LCSW as Social Worker (Licensed Clinical Social Worker) [name], Noberto Retort, DO as Consulting Physician (Neurology) [name], Paramedic as Paramedic Chanetta Marshall, Meridee Score, MD as Attending Physician (Family Medicine) Clinton Gallant, RN as [facility] Care Management Bensimhon, Bevelyn Buckles, MD as Consulting Physician (Cardiology) Lolly Mustache Phillips Grout, MD as Consulting Physician (Psychiatry) Candelaria Stagers, DPM as Consulting Physician (Podiatry)  Patient Active Problem List   Diagnosis Date Noted   Major depressive disorder, recurrent episode, severe (HCC) 07/27/2022   MDD (major depressive disorder), recurrent severe, without psychosis (HCC) 07/27/2022   MDD (major depressive disorder) 07/27/2022   Elevated troponin 12/02/2021   Nausea & vomiting 12/01/2021   Acute left ankle pain 10/11/2021   Intractable nausea and vomiting 10/08/2021    AKI (acute kidney injury) (HCC) 10/08/2021   Hyponatremia 10/08/2021   Decreased estrogen level 08/01/2021   Dementia (HCC) 08/01/2021   Frail elderly 08/01/2021   Hyperparathyroidism (HCC) 08/01/2021   Spondylolisthesis 08/01/2021   Varicose veins of bilateral lower extremities with other complications 08/01/2021   Lumbago without sciatica 04/30/2021   Abnormal gait 06/11/2020   Adult failure to thrive syndrome 06/11/2020   Allergic rhinitis 06/11/2020   Anxiety 06/11/2020   Asthma 06/11/2020   Benign intracranial hypertension 06/11/2020   Body mass index (BMI) 45.0-49.9, adult (HCC) 06/11/2020   Bowel incontinence 06/11/2020   Cholelithiasis 06/11/2020   Chronic sinusitis 06/11/2020   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   Daytime somnolence 06/11/2020   Diarrhea of presumed infectious origin 06/11/2020   Edema 06/11/2020   Family history of malignant neoplasm of gastrointestinal tract 06/11/2020   Gout 06/11/2020   History of fall 06/11/2020   History of infectious disease 06/11/2020   Insomnia 06/11/2020   Irregular bowel habits 06/11/2020   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   Lumbar spondylosis with myelopathy 06/11/2020   Macrocytosis 06/11/2020   Mild recurrent major depression (HCC) 06/11/2020   Personal history of colonic polyps 06/11/2020   Personal history of malignant neoplasm of breast 06/11/2020   Repeated falls 06/11/2020   Mixed anxiety and depressive disorder 06/11/2020   Irritable bowel syndrome 01/04/2020   Spinal stenosis of lumbar region 01/03/2020   Other symptoms and signs involving the musculoskeletal system 09/11/2019   Bilateral leg weakness 08/01/2019   Leg swelling 08/01/2019   Diabetes mellitus  with neuropathy (HCC) 06/15/2019   Hypokalemia 11/08/2018   CKD (chronic kidney disease) stage 3, GFR 30-59 ml/min (HCC) 11/08/2018   Orthostatic hypotension 07/28/2017   SVD (spontaneous vaginal delivery)    Peripheral neuropathy    On home oxygen therapy     Migraines    Left bundle branch block    Hypothyroidism    Hypertension    Hyperlipidemia    Heart murmur    GERD (gastroesophageal reflux disease)    Exertional shortness of breath    Major depressive disorder    Back pain    Arthritis    Generalized anxiety disorder    Anemia    Chronic respiratory failure (HCC) 09/14/2013   Biventricular ICD (implantable cardioverter-defibrillator) in place 08/04/2013   Morbid obesity (HCC) 11/01/2012   Chronic systolic heart failure (HCC) 10/27/2012   Endometrial polyp 01/20/2012   Malignant tumor of breast (HCC) 03/26/2011   Unspecified vitamin D deficiency 03/26/2011    Current Outpatient Medications:    acetaZOLAMIDE (DIAMOX) 125 MG tablet, Take 1 tablet (125 mg total) by mouth daily., Disp: 90 tablet, Rfl: 3   Ascorbic Acid (VITAMIN C) 1000 MG tablet, Take 1,000 mg by mouth daily., Disp: , Rfl:    buPROPion (WELLBUTRIN XL) 150 MG 24 hr tablet, Take 1 tablet (150 mg total) by mouth daily., Disp: 30 tablet, Rfl: 0   carvedilol (COREG) 3.125 MG tablet, TAKE 1 TABLET (3.125 MG) BY MOUTH TWICE DAILY WITH MEALS, Disp: 180 tablet, Rfl: 3   citalopram (CELEXA) 20 MG tablet, Take 1 tablet (20 mg total) by mouth daily., Disp: 30 tablet, Rfl: 1   clonazePAM (KLONOPIN) 0.5 MG tablet, Take 1 tablet (0.5 mg total) by mouth 2 (two) times daily., Disp: 60 tablet, Rfl: 1   cyanocobalamin (VITAMIN B12) 1000 MCG tablet, Take 1,000 mcg by mouth daily., Disp: , Rfl:    dicyclomine (BENTYL) 20 MG tablet, Take 20 mg by mouth 3 (three) times daily before meals., Disp: , Rfl:    fluticasone (FLONASE) 50 MCG/ACT nasal spray, Place 1 spray into both nostrils daily., Disp: , Rfl:    gabapentin (NEURONTIN) 100 MG capsule, Take 1 capsule (100 mg total) by mouth 2 (two) times daily., Disp: 180 capsule, Rfl: 3   levothyroxine (SYNTHROID) 88 MCG tablet, TAKE 1 TABLET(88 MCG) BY MOUTH DAILY BEFORE BREAKFAST, Disp: 90 tablet, Rfl: 3   memantine (NAMENDA) 5 MG tablet, Take 1  tablet (5 mg total) by mouth daily., Disp: 30 tablet, Rfl: 3   methylPREDNISolone (MEDROL DOSEPAK) 4 MG TBPK tablet, Take as directed, Disp: 21 each, Rfl: 0   metolazone (ZAROXOLYN) 2.5 MG tablet, TAKE 1 TABLET BY MOUTH EVERY OTHER TUESDAY., Disp: 12 tablet, Rfl: 3   midodrine (PROAMATINE) 10 MG tablet, TAKE 1 TABLET THREE TIMES DAILY WITH MEALS, Disp: 270 tablet, Rfl: 3   Multiple Vitamin (MULTIVITAMIN WITH MINERALS) TABS tablet, Take 1 tablet by mouth daily., Disp: , Rfl:    potassium chloride SA (KLOR-CON M) 20 MEQ tablet, TAKE 2 TABLETS THREE TIMES DAILY. TAKE AN EXTRA 2 TABLETS ON DAYS YOU TAKE METOLAZONE, Disp: 572 tablet, Rfl: 3   risperiDONE (RISPERDAL) 0.5 MG tablet, Take 1 tablet (0.5 mg total) by mouth 2 (two) times daily at 8 am and 4 pm., Disp: 60 tablet, Rfl: 1   simvastatin (ZOCOR) 10 MG tablet, TAKE 1 TABLET EVERY EVENING, Disp: 90 tablet, Rfl: 3   spironolactone (ALDACTONE) 25 MG tablet, TAKE 1 TABLET (25 MG TOTAL) BY MOUTH EVERY EVENING., Disp:  90 tablet, Rfl: 3   torsemide (DEMADEX) 20 MG tablet, Take 4 tablets (80 mg total) by mouth in the morning AND 3 tablets (60 mg total) every evening. NEEDS FOLLOW UP APPOINTMENT FOR ANYMORE REFILLS., Disp: 210 tablet, Rfl: 0 Allergies  Allergen Reactions   Ceftin Anaphylaxis    Face and throat swell    Cefuroxime Axetil Anaphylaxis    Face and throat swell   Geodon [Ziprasidone Hcl] Hives   Lisinopril Other (See Comments)    angioedema   Cefuroxime     Other reaction(s): anaphylaxis   Sulfacetamide Sodium-Sulfur     Other reaction(s): itch   Allopurinol Nausea Only and Other (See Comments)    weakness   Ativan [Lorazepam] Itching   Sulfa Antibiotics Itching   Valium [Diazepam] Other (See Comments)    Patient states that diazepam doesn't relax, it has the opposite effect.     Social History   Socioeconomic History   Marital status: Married    Spouse name: Not on file   Number of children: 2   Years of education: 55    Highest education level: Master's degree (e.g., MA, MS, MEng, MEd, MSW, MBA)  Occupational History   Occupation: retired  Tobacco Use   Smoking status: Some Days    Packs/day: 0.10    Years: 26.00    Additional pack years: 0.00    Total pack years: 2.60    Types: Cigarettes    Last attempt to quit: 03/23/2021    Years since quitting: 1.6   Smokeless tobacco: Never  Vaping Use   Vaping Use: Never used  Substance and Sexual Activity   Alcohol use: No   Drug use: No   Sexual activity: Yes  Other Topics Concern   Not on file  Social History Narrative   Tobacco Use Cigarettes: Former Smoker, Quit in 2008   No Alcohol   No recreational drug use   Diet: Regular/Low Carb   Exercise: None   Occupation: disabled   Education: Company secretary, masters   Children: 2   Firearms: No   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      Social Determinants of Health   Financial Resource Strain: Not on file  Food Insecurity: No Food Insecurity (08/11/2022)   Hunger Vital Sign    Worried About Running Out of Food in the Last Year: Never true    Ran Out of Food in the Last Year: Never true  Transportation Needs: No Transportation Needs (08/11/2022)   PRAPARE - Administrator, Civil Service (Medical): No    Lack of Transportation (Non-Medical): No  Physical Activity: Not on file  Stress: No Stress Concern Present (08/11/2022)   [name] of Occupational Health - Occupational Stress Questionnaire    Feeling of Stress : Only a little  Social Connections: Not on file  Intimate Partner Violence: Not At Risk (07/27/2022)   Humiliation, Afraid, Rape, and Kick questionnaire    Fear of Current or Ex-Partner: No    Emotionally Abused: No    Physically Abused: No    Sexually Abused: No    Physical Exam      Future Appointments  Date Time Provider Department Center  12/02/2022  1:30 PM [facility] PA/NP [facility] None  12/14/2022  7:10 AM  [facility] DEVICE REMOTES [facility]  12/30/2022  7:00 AM [facility] DEVICE REMOTES [facility]  01/28/2023  4:20 PM Arfeen, Phillips Grout,  MD [facility] None  02/03/2023 10:45 AM Freddie Breech, DPM [facility]  03/15/2023  7:10 AM [facility] DEVICE REMOTES [facility]  06/02/2023  1:00 PM Rosann Auerbach, PhD [facility] None  06/02/2023  2:00 PM [facility] TECH [facility] None  06/09/2023  2:30 PM Rosann Auerbach, PhD [facility] None  06/14/2023  7:10 AM [facility] DEVICE REMOTES [facility]  06/29/2023  1:30 PM Nita Sickle K, DO [facility] None  09/13/2023  7:10 AM [facility] DEVICE REMOTES [facility]     ACTION: Home visit completed

## 2022-12-02 ENCOUNTER — Other Ambulatory Visit (HOSPITAL_COMMUNITY): Payer: Self-pay

## 2022-12-02 ENCOUNTER — Encounter (HOSPITAL_COMMUNITY): Payer: Self-pay

## 2022-12-02 ENCOUNTER — Ambulatory Visit (HOSPITAL_COMMUNITY)
Admission: RE | Admit: 2022-12-02 | Discharge: 2022-12-02 | Disposition: A | Discharge status: Home / Self Care | Payer: Medicare HMO | Source: Ambulatory Visit | Attending: Cardiology | Admitting: Cardiology

## 2022-12-02 VITALS — BP 140/82 | HR 77 | Wt 184.0 lb

## 2022-12-02 DIAGNOSIS — N183 Chronic kidney disease, stage 3 unspecified: Secondary | ICD-10-CM | POA: Insufficient documentation

## 2022-12-02 DIAGNOSIS — Z171 Estrogen receptor negative status [ER-]: Secondary | ICD-10-CM | POA: Insufficient documentation

## 2022-12-02 DIAGNOSIS — Z9221 Personal history of antineoplastic chemotherapy: Secondary | ICD-10-CM | POA: Insufficient documentation

## 2022-12-02 DIAGNOSIS — I130 Hypertensive heart and chronic kidney disease with heart failure and stage 1 through stage 4 chronic kidney disease, or unspecified chronic kidney disease: Secondary | ICD-10-CM | POA: Diagnosis not present

## 2022-12-02 DIAGNOSIS — Z79899 Other long term (current) drug therapy: Secondary | ICD-10-CM | POA: Diagnosis not present

## 2022-12-02 DIAGNOSIS — J9610 Chronic respiratory failure, unspecified whether with hypoxia or hypercapnia: Secondary | ICD-10-CM | POA: Diagnosis not present

## 2022-12-02 DIAGNOSIS — I428 Other cardiomyopathies: Secondary | ICD-10-CM | POA: Diagnosis not present

## 2022-12-02 DIAGNOSIS — Z95810 Presence of automatic (implantable) cardiac defibrillator: Secondary | ICD-10-CM | POA: Diagnosis not present

## 2022-12-02 DIAGNOSIS — F32A Depression, unspecified: Secondary | ICD-10-CM | POA: Insufficient documentation

## 2022-12-02 DIAGNOSIS — E1122 Type 2 diabetes mellitus with diabetic chronic kidney disease: Secondary | ICD-10-CM | POA: Insufficient documentation

## 2022-12-02 DIAGNOSIS — I878 Other specified disorders of veins: Secondary | ICD-10-CM | POA: Insufficient documentation

## 2022-12-02 DIAGNOSIS — I5032 Chronic diastolic (congestive) heart failure: Secondary | ICD-10-CM

## 2022-12-02 DIAGNOSIS — I951 Orthostatic hypotension: Secondary | ICD-10-CM | POA: Insufficient documentation

## 2022-12-02 DIAGNOSIS — Z853 Personal history of malignant neoplasm of breast: Secondary | ICD-10-CM | POA: Diagnosis not present

## 2022-12-02 DIAGNOSIS — I5022 Chronic systolic (congestive) heart failure: Secondary | ICD-10-CM | POA: Insufficient documentation

## 2022-12-02 DIAGNOSIS — G932 Benign intracranial hypertension: Secondary | ICD-10-CM | POA: Insufficient documentation

## 2022-12-02 DIAGNOSIS — Z923 Personal history of irradiation: Secondary | ICD-10-CM | POA: Insufficient documentation

## 2022-12-02 DIAGNOSIS — Z6834 Body mass index (BMI) 34.0-34.9, adult: Secondary | ICD-10-CM | POA: Diagnosis not present

## 2022-12-02 LAB — BASIC METABOLIC PANEL
Anion gap: 12 (ref 5–15)
BUN: 23 mg/dL (ref 8–23)
CO2: 26 mmol/L (ref 22–32)
Calcium: 9.5 mg/dL (ref 8.9–10.3)
Chloride: 98 mmol/L (ref 98–111)
Creatinine, Ser: 1.68 mg/dL — ABNORMAL HIGH (ref 0.44–1.00)
GFR, Estimated: 33 mL/min — ABNORMAL LOW (ref >60)
Glucose, Bld: 143 mg/dL — ABNORMAL HIGH (ref 70–99)
Potassium: 4 mmol/L (ref 3.5–5.1)
Sodium: 136 mmol/L (ref 135–145)

## 2022-12-02 LAB — BRAIN NATRIURETIC PEPTIDE: B Natriuretic Peptide: 73.7 pg/mL (ref 0.0–100.0)

## 2022-12-02 MED ORDER — MIDODRINE HCL 5 MG PO TABS: 5.0000 mg | ORAL_TABLET | Freq: Three times a day (TID) | ORAL | 3 refills | Status: DC

## 2022-12-02 NOTE — Patient Instructions (Signed)
DECREASE Midodrine to 5 mg Three times a day  Labs done today, your results will be available in MyChart, we will contact you for abnormal readings.  Your physician has requested that you have an echocardiogram. Echocardiography is a painless test that uses sound waves to create images of your heart. It provides your doctor with information about the size and shape of your heart and how well your heart's chambers and valves are working. This procedure takes approximately one hour. There are no restrictions for this procedure. Please do NOT wear cologne, perfume, aftershave, or lotions (deodorant is allowed). Please arrive 15 minutes prior to your appointment time.  Your physician recommends that you schedule a follow-up appointment in: 4 week with an echocardiogram.  If you have any questions or concerns before your next appointment please send [place] a message through [place] or call our office at [phone].    TO LEAVE A MESSAGE FOR THE NURSE SELECT OPTION 2, PLEASE LEAVE A MESSAGE INCLUDING: YOUR NAME DATE OF BIRTH CALL BACK NUMBER REASON FOR CALL**this is important as we prioritize the call backs  YOU WILL RECEIVE A CALL BACK THE SAME DAY AS LONG AS YOU CALL BEFORE 4:00 PM  At the [facility], you and your health needs are our priority. As part of our continuing mission to provide you with exceptional heart care, we have created designated Provider Care Teams. These Care Teams include your primary Cardiologist (physician) and Advanced Practice Providers (APPs- Physician Assistants and Nurse Practitioners) who all work together to provide you with the care you need, when you need it.   You may see any of the following providers on your designated Care Team at your next follow up: Dr Arvilla Meres Dr Marca Ancona Dr. Marcos Eke, NP Robbie Lis, [place] [facility] [place], [place] Brynda Peon, NP Karle Plumber, PharmD   Please be sure to  bring in all your medications bottles to every appointment.    Thank you for choosing [facility]

## 2022-12-02 NOTE — Progress Notes (Signed)
ADVANCED HF CLINIC NOTE   Patient ID: Rhonda [name], female   DOB: Sep 04, 1950, 72 y.o.   MRN: [id number]  Primary Cardiologist: Dr Mayford Knife  General Surgeon: Dr Dwain Sarna  Orthopedic: Dr Charlann Boxer  PCP: Dr Hyman Hopes HF Cardiologist: Dr. Gala Romney  HPI: Rhonda [name] is a 72 y.o. 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 She has had sleep study with no  evidence of sleep apnea in remote past.  She has a Medtronic CRT-D device. Echo in 7/18 showed recovery of EF to 55-60%. PYP 09/02/17 negative for TTR (Grade 0-1, H/CCL 1.2). SPEP with no M-spike.   Has been followed by Nita Sickle in Neurology. Has been on Florinef and midodrine for orthostatic hypotension (failed mestinon).   Seen by Dr. Marisue Humble and SCr slightly worse so Florinef stopped. Echo 1/23 EF 60-65%, RV ok  Follow up 2/23, SCr trending up and metolazone changed to every other Tuesday.   Admitted 5/23 with AKI. Resuscitated with IVF, Florinef and midodrine restarted. SCr stabilized at 1.6-1.8.  Last seen in the [facility] 8/23. Was doing well at the time and enrolled w/ paramedicine. Also followed in ICM Heart failure device Clinic.   She returns today for f/u. Here w/ husband and paramedicine. Keeping up w/ meds but diet and has not been consistent. Suffers from depression affecting appetite and food choices. Also very sedentary, which she attributes more to depression than presence of exertional symptoms. < 1 hr of activity daily. Denies resting dyspnea. No SOB ambulating around her house. Device interrogated today. No VT/VF. She had several days of fluid retention in June based on impedence fluid curves but have  since improved back to baseline. Paramedicine reports that this correlates to an increase in her wt in June during treatment of gout flare w/ 10 day course of prednisone. Paramedicine confirms that wt improved shortly after finishing course. She has been compliant w/ torsemide and bi-weekly metolazone.   She is no longer on Florninef. She remains on midodrine 10 mg tid. BP 140/82 prior to mid day dose. No significant orthostatic symptoms and no falls recently.      Cardiac Studies  - Echo (5/14): EF 35% RV ok  - Echo (11/14): EF 20-25%, LV moderately dilated - Echo (5/15): EF 45-50% - Echo (7/18): EF 55-60%, normal RV size and systolic function, PASP 34 mmHg  - PYP (4/19): negative TTR - Echo (1/21): EF 60-65% grade II DD. RV ok - Echo (1/23): EF 60-65%, RV ok  11/24/12 Creatinine 1.23 Potassium 4.1 12/15/12 Creatinine 2.03 Potassium 3.3  12/22/12 Creatinine 1.2 Potassium 3.8 02/23/13 Creatinine 1.68 K 4.4  05/02/13 K 4.2 Creatinine 1.8  10/11/13 K 3.7 Creatinine 1.69 10/17 K 4.1, creatinine 1.35 09/2017: K3.8 Creatinine 1.57  Review of systems complete and found to be negative unless listed in HPI.   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    Complication of anesthesia    History of low blood pressure after surgery; attributed to lying flat   Diabetes mellitus    "diet controlled" (05/03/2013)   Exertional shortness of breath    Generalized anxiety disorder    GERD (gastroesophageal reflux disease)    Gout     Heart murmur    Hyperlipidemia    Hypertension    Hypokalemia 11/08/2018   Hypothyroidism    Left bundle branch block    s/p CRT-D (04/2013)   Major depressive disorder    Migraines    On home oxygen therapy    "2L suppose to be q night" (05/03/2013)   Orthostatic hypotension 07/28/2017   Peripheral neuropathy    Feet   SVD (spontaneous vaginal delivery)    x 2   Unspecified vitamin D deficiency 03/26/2011   Does not take meds   Current Outpatient Medications  Medication Sig Dispense Refill   acetaZOLAMIDE (DIAMOX) 125 MG tablet Take 1 tablet (125 mg total) by mouth daily. 90 tablet 3   Ascorbic Acid (VITAMIN C) 1000 MG tablet Take 1,000 mg by mouth daily.     buPROPion (WELLBUTRIN XL) 150 MG 24 hr tablet Take 1 tablet (150 mg total) by mouth daily. 30 tablet 0   carvedilol (COREG) 3.125 MG tablet TAKE 1 TABLET (3.125 MG) BY MOUTH TWICE DAILY WITH MEALS 180 tablet 3   citalopram (CELEXA) 20 MG tablet Take 1 tablet (20 mg total) by mouth daily. 30 tablet 1   clonazePAM (KLONOPIN) 0.5 MG tablet Take 1 tablet (0.5 mg total) by mouth 2 (two) times daily. 60 tablet 1   cyanocobalamin (VITAMIN B12) 1000 MCG tablet Take 1,000 mcg by mouth daily.     dicyclomine (BENTYL) 20 MG tablet Take 20 mg by mouth 3 (three) times daily before meals.     fluticasone (FLONASE) 50 MCG/ACT nasal spray Place 1 spray into both nostrils daily.     gabapentin (NEURONTIN) 100 MG capsule Take 1 capsule (100 mg total) by mouth 2 (two) times daily. 180 capsule 3   levothyroxine (SYNTHROID) 88 MCG tablet TAKE 1 TABLET(88 MCG) BY MOUTH DAILY BEFORE BREAKFAST 90 tablet 3   memantine (NAMENDA) 5 MG tablet Take 1 tablet (5 mg total) by mouth daily. 30 tablet 3   metolazone (ZAROXOLYN) 2.5 MG tablet TAKE 1 TABLET BY MOUTH EVERY OTHER TUESDAY. 12 tablet 3   Multiple Vitamin (MULTIVITAMIN WITH MINERALS) TABS tablet Take 1 tablet by mouth daily.     potassium chloride SA (KLOR-CON M) 20 MEQ tablet TAKE 2 TABLETS THREE  TIMES DAILY. TAKE AN EXTRA 2 TABLETS ON DAYS YOU TAKE METOLAZONE 572 tablet 3   risperiDONE (RISPERDAL) 0.5 MG tablet Take 1 tablet (0.5 mg total) by mouth 2 (two) times daily at 8 am and 4 pm. 60 tablet 1   simvastatin (ZOCOR) 10 MG tablet TAKE 1 TABLET EVERY EVENING 90 tablet 3   spironolactone (ALDACTONE) 25 MG tablet TAKE 1 TABLET (25 MG TOTAL) BY MOUTH EVERY EVENING. 90 tablet 3   torsemide (DEMADEX) 20 MG tablet Take 4 tablets (80 mg total) by mouth in the morning AND 3 tablets (60 mg total) every evening. NEEDS FOLLOW UP APPOINTMENT FOR ANYMORE REFILLS. 210  tablet 0   midodrine (PROAMATINE) 5 MG tablet Take 1 tablet (5 mg total) by mouth 3 (three) times daily. 200 tablet 3   No current facility-administered medications for this encounter.   BP (!) 140/82   Pulse 77   Wt 83.5 kg (184 lb)   SpO2 99%   BMI 34.77 kg/m   Wt Readings from Last 3 Encounters:  12/02/22 83.5 kg (184 lb)  11/23/22 83.9 kg (185 lb)  11/09/22 84.8 kg (187 lb)    PHYSICAL EXAM: General:  chronically ill appearing. No respiratory difficulty HEENT: normal Neck: supple. no JVD. Carotids 2+ bilat; no bruits. No lymphadenopathy or thyromegaly appreciated. Cor: PMI nondisplaced. Regular rate & rhythm. No rubs, gallops or murmurs. Lungs: clear Abdomen: soft, nontender, nondistended. No hepatosplenomegaly. No bruits or masses. Good bowel sounds. Extremities: no cyanosis, clubbing, rash, edema Neuro: alert & oriented x 3, cranial nerves grossly intact. moves all 4 extremities w/o difficulty. Affect pleasant.   Device interrogation (personally reviewed): OptiVol fluid index trending back up but below threshold, thoracic impedence down. No VT. No AF. Activity level < 0.3 hr/day.  ASSESSMENT & PLAN: 1) Chronic systolic HF with recovered EF/nonischemic cardiomyopathy:  - NICM s/p Medtronic CRT-D, ? Viral myocarditis. Cardiomyopathy dates from 36.  - EF 45-50% (09/2013)  - EF up to 55-60% on 2020.  - PYP 09/02/17  negative for TTR (Grade 0-1, H/CCL 1.2). SPEP with no M-spike.  - Echo 06/07/19: EF 60-65% grade II DD. RV ok.  - Echo 1/23 EF 60-65% RV normal.  - NYHA II, though not very active at baseline. Volume appears ok on exam however fluid index trending up and impedence down on device interrogation. Index has not crossed threshold. No VT  - Continue torsemide 80 mg q am/60 mg q pm + metolazone every other Tuesday. Check BNP. Will likely need dose titration of torsemide to 80 mg bid if elevated - continue enrollment in ICM heart failure device clinic   - repeat echo  - Continue carvedilol 3.125 mg bid. - Continue spironolactone 25 mg daily.  - wean midodrine to 5 mg tid. - No SGT2i with chronic yeast infections. - Encouraged compression stockings. - BMP and BNP today  - c/w paramedicine  - needs to move more/increase physical activity. Encouraged she return to [employer] program at [facility].   2) Obesity - Body mass index is 34.77 kg/m. - Encouraged PREP program.  3) HTN - Blood pressure well controlled.  - GDMT per avove - weaning midodrine per above   4) Orthostatic Hypotension.  - Neurology managing. She is on diamox for increased ICP - Has been maintained on midodrine 10 mg tid.  - BPs running a little high, will cautiously trail reducing midodrine to 5 mg tid. Paramedicine will f/u in 2 days on BP. Instructed to change positions slowly   5) Chronic Venous stasis - Compression stockings and leg elevation.   6) Falls  - No recent falls. - Improved. Seeing Neurology.   7) CKD 3 - Baseline SCr 1.4-1.6 - Recently 1.7-1.8  Follows with Dr. Marisue Humble in Nephrology - check BMP today    Follow up w/ APP in 4 wks   Seema Blum PA-C  3:03 PM

## 2022-12-02 NOTE — Progress Notes (Signed)
Paramedicine Encounter  Patient ID: Rhonda [name], female, DOB: 11-Jun-1950, 72 y.o.,  MRN: [id number]  Met patient in clinic today with Boyce Medici, PA.  Weight @ clinic- 184 lbs B/P-140/82 P-77 [id number] ICD reviewed - fluid down from June 2024.  Med changes: Midodrine decreased to 5mg  TID. Pt did not bring pill box to appointment, will update pill box on 12/03/22 to represent dose change.  Will follow up for BP check on Monday's home visit.    [name], EMT-Paramedic [phone] 12/02/2022

## 2022-12-03 ENCOUNTER — Other Ambulatory Visit (HOSPITAL_COMMUNITY): Payer: Self-pay

## 2022-12-03 NOTE — Progress Notes (Signed)
Paramedicine Encounter    Patient ID: Rhonda [name], female    DOB: [birth year], 71 y.o.   MRN: [id number]  Met with Fina in the home to make med change as ordered by HF clinic provider B. [facility] PA yesterday. [name]'s cut in half. Will meet in home on Monday for follow up visit to see how she is handling the change.  No other concerns. Visit complete.    [name], EMT-Paramedic [phone] 12/03/2022   ACTION: Home visit completed

## 2022-12-08 ENCOUNTER — Other Ambulatory Visit (HOSPITAL_COMMUNITY): Payer: Self-pay | Admitting: Emergency Medicine

## 2022-12-08 NOTE — Progress Notes (Signed)
Paramedicine Encounter    Patient ID: Rhonda [name], female    DOB: 1950-09-28, 72 y.o.   MRN: [id number]   Compliance with meds - reported missing last night's dose due to gagging when taking medication. Potassium was broken in two to aid in easier consumption  Pill box filled - for 1x week   Refills needed - Carvedilol, clonazepam, namenda.   Meds changes since last visit - Midodrine decreased from 10mg  to 5mg  on 11/30/22.     Social changes - none   BP 102/62   Pulse 87   Resp 18   Wt 182 lb 8 oz (82.8 kg)   SpO2 98%   BMI 34.48 kg/m  Weight yesterday - did not weigh (moved scale and chart to a convenient location to help encourage daily weights)  Last visit weight-184 lbs   Met with Deriana in the home today. Pt was limping with obvious swelling to her left lower extremity. Pt reported that the pain recently started. Pt has a hx of gout flare-ups and has an appointment for same tomorrow with her PCP. Aside from her left lower extremity, pt denied any other complaints. Pt denied shortness of breath and chest pain. Pt did report slight light headedness, but negative for orthostatics. Pt has hx of persistent dizziness, and pt doesn't seem to think today is any worse than baseline. Pt was assessed as noted. Pill box was filled for 1x week. Upcoming appointments reviewed with pt. Will follow up in 1x week. Home visit complete.    Patient Care Team: Shon Hale, MD as PCP - General (Family Medicine) Burna Sis, LCSW as Social Worker (Licensed Clinical Social Worker) [name], Noberto Retort, DO as Consulting Physician (Neurology) [name], Paramedic as Paramedic Chanetta Marshall, Meridee Score, MD as Attending Physician (Family Medicine) Clinton Gallant, RN as [facility] Care Management Bensimhon, Bevelyn Buckles, MD as Consulting Physician (Cardiology) Lolly Mustache Phillips Grout, MD as Consulting Physician (Psychiatry) Candelaria Stagers, DPM as Consulting Physician  (Podiatry)  Patient Active Problem List   Diagnosis Date Noted   Major depressive disorder, recurrent episode, severe (HCC) 07/27/2022   MDD (major depressive disorder), recurrent severe, without psychosis (HCC) 07/27/2022   MDD (major depressive disorder) 07/27/2022   Elevated troponin 12/02/2021   Nausea & vomiting 12/01/2021   Acute left ankle pain 10/11/2021   Intractable nausea and vomiting 10/08/2021   AKI (acute kidney injury) (HCC) 10/08/2021   Hyponatremia 10/08/2021   Decreased estrogen level 08/01/2021   Dementia (HCC) 08/01/2021   Frail elderly 08/01/2021   Hyperparathyroidism (HCC) 08/01/2021   Spondylolisthesis 08/01/2021   Varicose veins of bilateral lower extremities with other complications 08/01/2021   Lumbago without sciatica 04/30/2021   Abnormal gait 06/11/2020   Adult failure to thrive syndrome 06/11/2020   Allergic rhinitis 06/11/2020   Anxiety 06/11/2020   Asthma 06/11/2020   Benign intracranial hypertension 06/11/2020   Body mass index (BMI) 45.0-49.9, adult (HCC) 06/11/2020   Bowel incontinence 06/11/2020   Cholelithiasis 06/11/2020   Chronic sinusitis 06/11/2020   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   Daytime somnolence 06/11/2020   Diarrhea of presumed infectious origin 06/11/2020   Edema 06/11/2020   Family history of malignant neoplasm of gastrointestinal tract 06/11/2020   Gout 06/11/2020   History of fall 06/11/2020   History of infectious disease 06/11/2020   Insomnia 06/11/2020   Irregular bowel habits 06/11/2020   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   Lumbar spondylosis with myelopathy 06/11/2020   Macrocytosis 06/11/2020   Mild recurrent  major depression (HCC) 06/11/2020   Personal history of colonic polyps 06/11/2020   Personal history of malignant neoplasm of breast 06/11/2020   Repeated falls 06/11/2020   Mixed anxiety and depressive disorder 06/11/2020   Irritable bowel syndrome 01/04/2020   Spinal stenosis of lumbar region 01/03/2020   Other  symptoms and signs involving the musculoskeletal system 09/11/2019   Bilateral leg weakness 08/01/2019   Leg swelling 08/01/2019   Diabetes mellitus with neuropathy (HCC) 06/15/2019   Hypokalemia 11/08/2018   CKD (chronic kidney disease) stage 3, GFR 30-59 ml/min (HCC) 11/08/2018   Orthostatic hypotension 07/28/2017   SVD (spontaneous vaginal delivery)    Peripheral neuropathy    On home oxygen therapy    Migraines    Left bundle branch block    Hypothyroidism    Hypertension    Hyperlipidemia    Heart murmur    GERD (gastroesophageal reflux disease)    Exertional shortness of breath    Major depressive disorder    Back pain    Arthritis    Generalized anxiety disorder    Anemia    Chronic respiratory failure (HCC) 09/14/2013   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   Morbid obesity (HCC) 11/01/2012   Chronic systolic heart failure (HCC) 10/27/2012   Endometrial polyp 01/20/2012   Malignant tumor of breast (HCC) 03/26/2011   Unspecified vitamin D deficiency 03/26/2011    Current Outpatient Medications:    acetaZOLAMIDE (DIAMOX) 125 MG tablet, Take 1 tablet (125 mg total) by mouth daily., Disp: 90 tablet, Rfl: 3   Ascorbic Acid (VITAMIN C) 1000 MG tablet, Take 1,000 mg by mouth daily., Disp: , Rfl:    buPROPion (WELLBUTRIN XL) 150 MG 24 hr tablet, Take 1 tablet (150 mg total) by mouth daily., Disp: 30 tablet, Rfl: 0   carvedilol (COREG) 3.125 MG tablet, TAKE 1 TABLET (3.125 MG) BY MOUTH TWICE DAILY WITH MEALS, Disp: 180 tablet, Rfl: 3   citalopram (CELEXA) 20 MG tablet, Take 1 tablet (20 mg total) by mouth daily., Disp: 30 tablet, Rfl: 1   clonazePAM (KLONOPIN) 0.5 MG tablet, Take 1 tablet (0.5 mg total) by mouth 2 (two) times daily., Disp: 60 tablet, Rfl: 1   cyanocobalamin (VITAMIN B12) 1000 MCG tablet, Take 1,000 mcg by mouth daily., Disp: , Rfl:    dicyclomine (BENTYL) 20 MG tablet, Take 20 mg by mouth 3 (three) times daily before  meals., Disp: , Rfl:    fluticasone (FLONASE) 50 MCG/ACT nasal spray, Place 1 spray into both nostrils daily., Disp: , Rfl:    gabapentin (NEURONTIN) 100 MG capsule, Take 1 capsule (100 mg total) by mouth 2 (two) times daily., Disp: 180 capsule, Rfl: 3   levothyroxine (SYNTHROID) 88 MCG tablet, TAKE 1 TABLET(88 MCG) BY MOUTH DAILY BEFORE BREAKFAST, Disp: 90 tablet, Rfl: 3   memantine (NAMENDA) 5 MG tablet, Take 1 tablet (5 mg total) by mouth daily., Disp: 30 tablet, Rfl: 3   metolazone (ZAROXOLYN) 2.5 MG tablet, TAKE 1 TABLET BY MOUTH EVERY OTHER TUESDAY., Disp: 12 tablet, Rfl: 3   midodrine (PROAMATINE) 5 MG tablet, Take 1 tablet (5 mg total) by mouth 3 (three) times daily., Disp: 200 tablet, Rfl: 3   Multiple Vitamin (MULTIVITAMIN WITH MINERALS) TABS tablet, Take 1 tablet by mouth daily., Disp: , Rfl:    potassium chloride SA (KLOR-CON M) 20 MEQ tablet, TAKE 2 TABLETS THREE TIMES DAILY. TAKE AN EXTRA 2 TABLETS ON DAYS YOU TAKE METOLAZONE, Disp: 572 tablet, Rfl: 3   risperiDONE (  RISPERDAL) 0.5 MG tablet, Take 1 tablet (0.5 mg total) by mouth 2 (two) times daily at 8 am and 4 pm., Disp: 60 tablet, Rfl: 1   simvastatin (ZOCOR) 10 MG tablet, TAKE 1 TABLET EVERY EVENING, Disp: 90 tablet, Rfl: 3   spironolactone (ALDACTONE) 25 MG tablet, TAKE 1 TABLET (25 MG TOTAL) BY MOUTH EVERY EVENING., Disp: 90 tablet, Rfl: 3   torsemide (DEMADEX) 20 MG tablet, Take 4 tablets (80 mg total) by mouth in the morning AND 3 tablets (60 mg total) every evening. NEEDS FOLLOW UP APPOINTMENT FOR ANYMORE REFILLS., Disp: 210 tablet, Rfl: 0 Allergies  Allergen Reactions   Ceftin Anaphylaxis    Face and throat swell    Cefuroxime Axetil Anaphylaxis    Face and throat swell   Geodon [Ziprasidone Hcl] Hives   Lisinopril Other (See Comments)    angioedema   Cefuroxime     Other reaction(s): anaphylaxis   Sulfacetamide Sodium-Sulfur     Other reaction(s): itch   Allopurinol Nausea Only and Other (See Comments)    weakness    Ativan [Lorazepam] Itching   Sulfa Antibiotics Itching   Valium [Diazepam] Other (See Comments)    Patient states that diazepam doesn't relax, it has the opposite effect.     Social History   Socioeconomic History   Marital status: Married    Spouse name: Not on file   Number of children: 2   Years of education: 32   Highest education level: Master's degree (e.g., MA, MS, MEng, MEd, MSW, MBA)  Occupational History   Occupation: retired  Tobacco Use   Smoking status: Some Days    Current packs/day: 0.00    Average packs/day: 0.1 packs/day for 26.0 years (2.6 ttl pk-yrs)    Types: Cigarettes    Start date: 03/24/1995    Last attempt to quit: 03/23/2021    Years since quitting: 1.7   Smokeless tobacco: Never  Vaping Use   Vaping status: Never Used  Substance and Sexual Activity   Alcohol use: No   Drug use: No   Sexual activity: Yes  Other Topics Concern   Not on file  Social History Narrative   Tobacco Use Cigarettes: Former Smoker, Quit in 2008   No Alcohol   No recreational drug use   Diet: Regular/Low Carb   Exercise: None   Occupation: disabled   Education: Company secretary, masters   Children: 2   Firearms: No   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      Social Determinants of Health   Financial Resource Strain: Not on file  Food Insecurity: No Food Insecurity (08/11/2022)   Hunger Vital Sign    Worried About Running Out of Food in the Last Year: Never true    Ran Out of Food in the Last Year: Never true  Transportation Needs: No Transportation Needs (08/11/2022)   PRAPARE - Administrator, Civil Service (Medical): No    Lack of Transportation (Non-Medical): No  Physical Activity: Not on file  Stress: No Stress Concern Present (08/11/2022)   [name] of Occupational Health - Occupational Stress Questionnaire    Feeling of Stress : Only a little  Social Connections: Not on file  Intimate Partner  Violence: Not At Risk (07/27/2022)   Humiliation, Afraid, Rape, and Kick questionnaire    Fear of Current or Ex-Partner: No    Emotionally Abused: No    Physically  Abused: No    Sexually Abused: No    Physical Exam      Future Appointments  Date Time Provider Department Center  12/14/2022  7:10 AM [facility] DEVICE REMOTES [facility]  12/30/2022  7:00 AM [facility] DEVICE REMOTES [facility]  12/30/2022  2:00 PM [facility] E[facility] OP 1 [facility]  12/30/2022  3:00 PM [facility] PA/NP [facility] None  01/28/2023  4:20 PM Arfeen, Phillips Grout, MD [facility] None  02/03/2023 10:45 AM Freddie Breech, DPM [facility]  03/15/2023  7:10 AM [facility] DEVICE REMOTES [facility]  06/02/2023  1:00 PM Rosann Auerbach, PhD [facility] None  06/02/2023  2:00 PM L[facility]- NEUROPSYCH TECH [facility] None  06/09/2023  2:30 PM Rosann Auerbach, PhD [facility] None  06/14/2023  7:10 AM [facility] DEVICE REMOTES [facility]  06/29/2023  1:30 PM Nita Sickle K, DO [facility] None  09/13/2023  7:10 AM [facility] DEVICE REMOTES [facility]     ACTION: Home visit completed  [name] Setting EMT-P Community Paramedic  [phone]  [phone] 12/08/22

## 2022-12-14 ENCOUNTER — Other Ambulatory Visit (HOSPITAL_COMMUNITY): Payer: Self-pay

## 2022-12-14 ENCOUNTER — Ambulatory Visit (INDEPENDENT_AMBULATORY_CARE_PROVIDER_SITE_OTHER): Payer: Medicare HMO

## 2022-12-14 ENCOUNTER — Other Ambulatory Visit (HOSPITAL_COMMUNITY): Payer: Self-pay | Admitting: Cardiology

## 2022-12-14 DIAGNOSIS — I428 Other cardiomyopathies: Secondary | ICD-10-CM | POA: Diagnosis not present

## 2022-12-14 DIAGNOSIS — I5022 Chronic systolic (congestive) heart failure: Secondary | ICD-10-CM

## 2022-12-14 NOTE — Progress Notes (Signed)
Paramedicine Encounter    Patient ID: Rhonda [name], female    DOB: 10-17-1950, 72 y.o.   MRN: [id number]   Complaints- none   Assessment- CAOX4, warm and dry, ambulatory no shortness of breath, dizziness, chest pain or swelling. Lungs clear. No pain reported.   Compliance with meds- one morning, one noon and one evening dose missed over the last week   Pill box filled- for one week   Refills needed- namenda, clonazepam, torsemide, bentyl   Meds changes since last visit- none     Social changes- none    VISIT SUMMARY- Met with Rhonda [name] in the home today for our visit and she reports feeling well. She was ambulating the home with no shortness of breath, no dizziness, no chest pain. She reports chronic sinus draining but this is normal due to her cleft palate. I obtained vitals as noted. She has not been using her home scale or BP cuff as instructed. I reminded her to do same daily and record it on the given sheets on the specific date. She verbalized understanding. I reviewed meds and filled pill box for one week. Refills as noted called into [facility]. I reviewed and wrote down upcoming appointments for her. Home visit complete.  She will see PCP on Wednesday per her husband. {For possible gout last week- she says this has subsided)  I will see Rhonda [name] in one week.   There were no vitals taken for this visit. Weight yesterday-- didn't weigh  Last visit weight-- 182lbs      ACTION: Home visit completed     Patient Care Team: Shon Hale, MD as PCP - General (Family Medicine) Burna Sis, LCSW as Social Worker (Licensed Clinical Social Worker) [name], Noberto Retort, DO as Consulting Physician (Neurology) [name], Paramedic as Paramedic Chanetta Marshall, Meridee Score, MD as Attending Physician (Family Medicine) Clinton Gallant, RN as [facility] Care Management Bensimhon, Bevelyn Buckles, MD as Consulting Physician (Cardiology) Lolly Mustache Phillips Grout, MD as Consulting  Physician (Psychiatry) Candelaria Stagers, DPM as Consulting Physician (Podiatry)  Patient Active Problem List   Diagnosis Date Noted   Major depressive disorder, recurrent episode, severe (HCC) 07/27/2022   MDD (major depressive disorder), recurrent severe, without psychosis (HCC) 07/27/2022   MDD (major depressive disorder) 07/27/2022   Elevated troponin 12/02/2021   Nausea & vomiting 12/01/2021   Acute left ankle pain 10/11/2021   Intractable nausea and vomiting 10/08/2021   AKI (acute kidney injury) (HCC) 10/08/2021   Hyponatremia 10/08/2021   Decreased estrogen level 08/01/2021   Dementia (HCC) 08/01/2021   Frail elderly 08/01/2021   Hyperparathyroidism (HCC) 08/01/2021   Spondylolisthesis 08/01/2021   Varicose veins of bilateral lower extremities with other complications 08/01/2021   Lumbago without sciatica 04/30/2021   Abnormal gait 06/11/2020   Adult failure to thrive syndrome 06/11/2020   Allergic rhinitis 06/11/2020   Anxiety 06/11/2020   Asthma 06/11/2020   Benign intracranial hypertension 06/11/2020   Body mass index (BMI) 45.0-49.9, adult (HCC) 06/11/2020   Bowel incontinence 06/11/2020   Cholelithiasis 06/11/2020   Chronic sinusitis 06/11/2020   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   Daytime somnolence 06/11/2020   Diarrhea of presumed infectious origin 06/11/2020   Edema 06/11/2020   Family history of malignant neoplasm of gastrointestinal tract 06/11/2020   Gout 06/11/2020   History of fall 06/11/2020   History of infectious disease 06/11/2020   Insomnia 06/11/2020   Irregular bowel habits 06/11/2020   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   Lumbar spondylosis with  myelopathy 06/11/2020   Macrocytosis 06/11/2020   Mild recurrent major depression (HCC) 06/11/2020   Personal history of colonic polyps 06/11/2020   Personal history of malignant neoplasm of breast 06/11/2020   Repeated falls 06/11/2020   Mixed anxiety and depressive disorder 06/11/2020   Irritable bowel syndrome  01/04/2020   Spinal stenosis of lumbar region 01/03/2020   Other symptoms and signs involving the musculoskeletal system 09/11/2019   Bilateral leg weakness 08/01/2019   Leg swelling 08/01/2019   Diabetes mellitus with neuropathy (HCC) 06/15/2019   Hypokalemia 11/08/2018   CKD (chronic kidney disease) stage 3, GFR 30-59 ml/min (HCC) 11/08/2018   Orthostatic hypotension 07/28/2017   SVD (spontaneous vaginal delivery)    Peripheral neuropathy    On home oxygen therapy    Migraines    Left bundle branch block    Hypothyroidism    Hypertension    Hyperlipidemia    Heart murmur    GERD (gastroesophageal reflux disease)    Exertional shortness of breath    Major depressive disorder    Back pain    Arthritis    Generalized anxiety disorder    Anemia    Chronic respiratory failure (HCC) 09/14/2013   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/04/2013   Morbid obesity (HCC) 11/01/2012   Chronic systolic heart failure (HCC) 10/27/2012   Endometrial polyp 01/20/2012   Malignant tumor of breast (HCC) 03/26/2011   Unspecified vitamin D deficiency 03/26/2011    Current Outpatient Medications:    acetaZOLAMIDE (DIAMOX) 125 MG tablet, Take 1 tablet (125 mg total) by mouth daily., Disp: 90 tablet, Rfl: 3   Ascorbic Acid (VITAMIN C) 1000 MG tablet, Take 1,000 mg by mouth daily., Disp: , Rfl:    buPROPion (WELLBUTRIN XL) 150 MG 24 hr tablet, Take 1 tablet (150 mg total) by mouth daily., Disp: 30 tablet, Rfl: 0   carvedilol (COREG) 3.125 MG tablet, TAKE 1 TABLET (3.125 MG) BY MOUTH TWICE DAILY WITH MEALS, Disp: 180 tablet, Rfl: 3   citalopram (CELEXA) 20 MG tablet, Take 1 tablet (20 mg total) by mouth daily., Disp: 30 tablet, Rfl: 1   clonazePAM (KLONOPIN) 0.5 MG tablet, Take 1 tablet (0.5 mg total) by mouth 2 (two) times daily., Disp: 60 tablet, Rfl: 1   cyanocobalamin (VITAMIN B12) 1000 MCG tablet, Take 1,000 mcg by mouth daily., Disp: , Rfl:    dicyclomine (BENTYL)  20 MG tablet, Take 20 mg by mouth 3 (three) times daily before meals., Disp: , Rfl:    fluticasone (FLONASE) 50 MCG/ACT nasal spray, Place 1 spray into both nostrils daily., Disp: , Rfl:    gabapentin (NEURONTIN) 100 MG capsule, Take 1 capsule (100 mg total) by mouth 2 (two) times daily., Disp: 180 capsule, Rfl: 3   levothyroxine (SYNTHROID) 88 MCG tablet, TAKE 1 TABLET(88 MCG) BY MOUTH DAILY BEFORE BREAKFAST, Disp: 90 tablet, Rfl: 3   memantine (NAMENDA) 5 MG tablet, Take 1 tablet (5 mg total) by mouth daily., Disp: 30 tablet, Rfl: 3   metolazone (ZAROXOLYN) 2.5 MG tablet, TAKE 1 TABLET BY MOUTH EVERY OTHER TUESDAY., Disp: 12 tablet, Rfl: 3   midodrine (PROAMATINE) 5 MG tablet, Take 1 tablet (5 mg total) by mouth 3 (three) times daily., Disp: 200 tablet, Rfl: 3   Multiple Vitamin (MULTIVITAMIN WITH MINERALS) TABS tablet, Take 1 tablet by mouth daily., Disp: , Rfl:    potassium chloride SA (KLOR-CON M) 20 MEQ tablet, TAKE 2 TABLETS THREE TIMES DAILY. TAKE AN EXTRA 2 TABLETS ON DAYS YOU  TAKE METOLAZONE, Disp: 572 tablet, Rfl: 3   risperiDONE (RISPERDAL) 0.5 MG tablet, Take 1 tablet (0.5 mg total) by mouth 2 (two) times daily at 8 am and 4 pm., Disp: 60 tablet, Rfl: 1   simvastatin (ZOCOR) 10 MG tablet, TAKE 1 TABLET EVERY EVENING, Disp: 90 tablet, Rfl: 3   spironolactone (ALDACTONE) 25 MG tablet, TAKE 1 TABLET (25 MG TOTAL) BY MOUTH EVERY EVENING., Disp: 90 tablet, Rfl: 3   torsemide (DEMADEX) 20 MG tablet, TAKE 4 TABLETS (80 MG TOTAL) BY MOUTH IN THE MORNING AND 3 TABLETS (60 MG TOTAL) EVERY EVENING., Disp: 210 tablet, Rfl: 0 Allergies  Allergen Reactions   Ceftin Anaphylaxis    Face and throat swell    Cefuroxime Axetil Anaphylaxis    Face and throat swell   Geodon [Ziprasidone Hcl] Hives   Lisinopril Other (See Comments)    angioedema   Cefuroxime     Other reaction(s): anaphylaxis   Sulfacetamide Sodium-Sulfur     Other reaction(s): itch   Allopurinol Nausea Only and Other (See Comments)     weakness   Ativan [Lorazepam] Itching   Sulfa Antibiotics Itching   Valium [Diazepam] Other (See Comments)    Patient states that diazepam doesn't relax, it has the opposite effect.     Social History   Socioeconomic History   Marital status: Married    Spouse name: Not on file   Number of children: 2   Years of education: 108   Highest education level: Master's degree (e.g., MA, MS, MEng, MEd, MSW, MBA)  Occupational History   Occupation: retired  Tobacco Use   Smoking status: Some Days    Current packs/day: 0.00    Average packs/day: 0.1 packs/day for 26.0 years (2.6 ttl pk-yrs)    Types: Cigarettes    Start date: 03/24/1995    Last attempt to quit: 03/23/2021    Years since quitting: 1.7   Smokeless tobacco: Never  Vaping Use   Vaping status: Never Used  Substance and Sexual Activity   Alcohol use: No   Drug use: No   Sexual activity: Yes  Other Topics Concern   Not on file  Social History Narrative   Tobacco Use Cigarettes: Former Smoker, Quit in 2008   No Alcohol   No recreational drug use   Diet: Regular/Low Carb   Exercise: None   Occupation: disabled   Education: Company secretary, masters   Children: 2   Firearms: No   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      Social Determinants of Health   Financial Resource Strain: Not on file  Food Insecurity: No Food Insecurity (08/11/2022)   Hunger Vital Sign    Worried About Running Out of Food in the Last Year: Never true    Ran Out of Food in the Last Year: Never true  Transportation Needs: No Transportation Needs (08/11/2022)   PRAPARE - Administrator, Civil Service (Medical): No    Lack of Transportation (Non-Medical): No  Physical Activity: Not on file  Stress: No Stress Concern Present (08/11/2022)   [name] of Occupational Health - Occupational Stress Questionnaire    Feeling of Stress : Only a little  Social Connections: Not on file   Intimate Partner Violence: Not At Risk (07/27/2022)   Humiliation, Afraid, Rape, and Kick questionnaire    Fear of Current or Ex-Partner: No    Emotionally Abused: No  Physically Abused: No    Sexually Abused: No    Physical Exam      Future Appointments  Date Time Provider Department Center  12/30/2022  7:00 AM [facility] DEVICE REMOTES [facility]  12/30/2022  2:00 PM [facility] ECHO OP 1 [facility]  12/30/2022  3:00 PM [facility] PA/NP [facility] None  01/28/2023  4:20 PM Arfeen, Phillips Grout, MD [facility] None  02/03/2023 10:45 AM Freddie Breech, DPM [facility]  03/15/2023  7:10 AM [facility] DEVICE REMOTES [facility]  06/02/2023  1:00 PM Rosann Auerbach, PhD [facility] None  06/02/2023  2:00 PM LBN- NEUROPSYCH TECH [facility] None  06/09/2023  2:30 PM Rosann Auerbach, PhD [facility] None  06/14/2023  7:10 AM [facility] DEVICE REMOTES [facility]  06/29/2023  1:30 PM Nita Sickle K, DO [facility] None  09/13/2023  7:10 AM [facility] DEVICE REMOTES [facility]

## 2022-12-15 ENCOUNTER — Telehealth (HOSPITAL_COMMUNITY): Payer: Self-pay | Admitting: Cardiology

## 2022-12-15 MED ORDER — TORSEMIDE 20 MG PO TABS: ORAL_TABLET | ORAL | 3 refills | Status: DC

## 2022-12-15 NOTE — Telephone Encounter (Signed)
-----   Message from [facility] S sent at 12/14/2022  6:24 PM EDT ----- Regarding: refill [name] needs refills for Torsemide sent to [facility] please- thanks!    [name], EMT-Paramedic [phone] 12/14/2022

## 2022-12-15 NOTE — Telephone Encounter (Signed)
As requested by para medicine Refill sent

## 2022-12-16 DIAGNOSIS — M79672 Pain in left foot: Secondary | ICD-10-CM | POA: Diagnosis not present

## 2022-12-16 DIAGNOSIS — K1370 Unspecified lesions of oral mucosa: Secondary | ICD-10-CM | POA: Diagnosis not present

## 2022-12-16 DIAGNOSIS — M79671 Pain in right foot: Secondary | ICD-10-CM | POA: Diagnosis not present

## 2022-12-16 DIAGNOSIS — I5032 Chronic diastolic (congestive) heart failure: Secondary | ICD-10-CM | POA: Diagnosis not present

## 2022-12-16 LAB — CUP PACEART REMOTE DEVICE CHECK
Battery Remaining Longevity: 56 mo
Battery Voltage: 2.97 V
Brady Statistic AP VP Percent: 1.75 %
Brady Statistic AP VS Percent: 0.02 %
Brady Statistic AS VP Percent: 97.03 %
Brady Statistic AS VS Percent: 1.2 %
Brady Statistic RA Percent Paced: 1.77 %
Brady Statistic RV Percent Paced: 98.36 %
Date Time Interrogation Session: 20240722093526
HighPow Impedance: 68 Ohm
Implantable Lead Connection Status: 753985
Implantable Lead Connection Status: 753985
Implantable Lead Connection Status: 753985
Implantable Lead Implant Date: 20141210
Implantable Lead Implant Date: 20141210
Implantable Lead Implant Date: 20141210
Implantable Lead Location: 753858
Implantable Lead Location: 753859
Implantable Lead Location: 753860
Implantable Lead Model: 4396
Implantable Lead Model: 5076
Implantable Lead Model: 6935
Implantable Pulse Generator Implant Date: 20211118
Lead Channel Impedance Value: 342 Ohm
Lead Channel Impedance Value: 456 Ohm
Lead Channel Impedance Value: 513 Ohm
Lead Channel Impedance Value: 532 Ohm
Lead Channel Impedance Value: 589 Ohm
Lead Channel Impedance Value: 760 Ohm
Lead Channel Pacing Threshold Amplitude: 0.5 V
Lead Channel Pacing Threshold Amplitude: 0.875 V
Lead Channel Pacing Threshold Amplitude: 1.125 V
Lead Channel Pacing Threshold Pulse Width: 0.4 ms
Lead Channel Pacing Threshold Pulse Width: 0.4 ms
Lead Channel Pacing Threshold Pulse Width: 0.4 ms
Lead Channel Sensing Intrinsic Amplitude: 22.75 mV
Lead Channel Sensing Intrinsic Amplitude: 22.75 mV
Lead Channel Sensing Intrinsic Amplitude: 3.125 mV
Lead Channel Sensing Intrinsic Amplitude: 3.125 mV
Lead Channel Setting Pacing Amplitude: 1.5 V
Lead Channel Setting Pacing Amplitude: 2 V
Lead Channel Setting Pacing Amplitude: 2.5 V
Lead Channel Setting Pacing Pulse Width: 0.4 ms
Lead Channel Setting Pacing Pulse Width: 0.4 ms
Lead Channel Setting Sensing Sensitivity: 0.3 mV
Zone Setting Status: 755011
Zone Setting Status: 755011

## 2022-12-21 ENCOUNTER — Other Ambulatory Visit (HOSPITAL_COMMUNITY): Payer: Self-pay | Admitting: Psychiatry

## 2022-12-21 ENCOUNTER — Telehealth (HOSPITAL_COMMUNITY): Payer: Self-pay | Admitting: Emergency Medicine

## 2022-12-21 ENCOUNTER — Other Ambulatory Visit (HOSPITAL_COMMUNITY): Payer: Self-pay | Admitting: Emergency Medicine

## 2022-12-21 DIAGNOSIS — F411 Generalized anxiety disorder: Secondary | ICD-10-CM

## 2022-12-21 DIAGNOSIS — F330 Major depressive disorder, recurrent, mild: Secondary | ICD-10-CM

## 2022-12-21 NOTE — Progress Notes (Signed)
Paramedicine Encounter    Patient ID: Rhonda [name], female    DOB: Jan 12, 1951, 72 y.o.   MRN: [id number]   Complaints - Continued sore throat  Assessment - CAOx4, warm, dry, and ambulatory on scene. Pt denied any dizziness, shortness of breath, or chest pain at present. Lung sounds clear, no edema present.  Compliance with meds - Missed 4x noon doses  Pill box filled - for 1x week  Refills needed - Bupropion, celexa, vitamin B12, allegra, Dicyclomine, Namenda.  Meds changes since last visit - none    Social changes - none   VISIT SUMMARY**  Visited Rhonda [name] in the home today. Pt complained of slight sore throat which seems to be fairly baseline for her due to preexisting cleft palate. Pt had been advised to try OTC allergy medication, however has not gotten any to try. Pt was advised again to go get OTC allergy medication to aid in pain. Pt has been non-compliant with weight and BP chart despite their prominent location in her kitchen. Pt was again reminded to weigh daily and the importance of same. Pt denied any other complaints at this time. Pt's pill box was filled for 1x week. All upcoming appointments reviewed. Will follow up in 1x week.   BP 122/72   Pulse 88   Resp 16   Wt 184 lb 1.6 oz (83.5 kg)   SpO2 100%   BMI 34.79 kg/m  Weight yesterday-DNW Last visit weight-182   [name] EMT-P Community Paramedic  [phone]     ACTION: Home visit completed     Patient Care Team: Shon Hale, MD as PCP - General (Family Medicine) Burna Sis, LCSW as Social Worker (Licensed Clinical Social Worker) [name], Noberto Retort, DO as Consulting Physician (Neurology) [name], Paramedic as Paramedic Chanetta Marshall, Meridee Score, MD as Attending Physician (Family Medicine) Clinton Gallant, RN as [facility] Care Management Bensimhon, Bevelyn Buckles, MD as Consulting Physician (Cardiology) Lolly Mustache Phillips Grout, MD as Consulting Physician  (Psychiatry) Candelaria Stagers, DPM as Consulting Physician (Podiatry)  Patient Active Problem List   Diagnosis Date Noted   Major depressive disorder, recurrent episode, severe (HCC) 07/27/2022   MDD (major depressive disorder), recurrent severe, without psychosis (HCC) 07/27/2022   MDD (major depressive disorder) 07/27/2022   Elevated troponin 12/02/2021   Nausea & vomiting 12/01/2021   Acute left ankle pain 10/11/2021   Intractable nausea and vomiting 10/08/2021   AKI (acute kidney injury) (HCC) 10/08/2021   Hyponatremia 10/08/2021   Decreased estrogen level 08/01/2021   Dementia (HCC) 08/01/2021   Frail elderly 08/01/2021   Hyperparathyroidism (HCC) 08/01/2021   Spondylolisthesis 08/01/2021   Varicose veins of bilateral lower extremities with other complications 08/01/2021   Lumbago without sciatica 04/30/2021   Abnormal gait 06/11/2020   Adult failure to thrive syndrome 06/11/2020   Allergic rhinitis 06/11/2020   Anxiety 06/11/2020   Asthma 06/11/2020   Benign intracranial hypertension 06/11/2020   Body mass index (BMI) 45.0-49.9, adult (HCC) 06/11/2020   Bowel incontinence 06/11/2020   Cholelithiasis 06/11/2020   Chronic sinusitis 06/11/2020   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   Daytime somnolence 06/11/2020   Diarrhea of presumed infectious origin 06/11/2020   Edema 06/11/2020   Family history of malignant neoplasm of gastrointestinal tract 06/11/2020   Gout 06/11/2020   History of fall 06/11/2020   History of infectious disease 06/11/2020   Insomnia 06/11/2020   Irregular bowel habits 06/11/2020   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   Lumbar spondylosis with myelopathy 06/11/2020  Macrocytosis 06/11/2020   Mild recurrent major depression (HCC) 06/11/2020   Personal history of colonic polyps 06/11/2020   Personal history of malignant neoplasm of breast 06/11/2020   Repeated falls 06/11/2020   Mixed anxiety and depressive disorder 06/11/2020   Irritable bowel syndrome 01/04/2020    Spinal stenosis of lumbar region 01/03/2020   Other symptoms and signs involving the musculoskeletal system 09/11/2019   Bilateral leg weakness 08/01/2019   Leg swelling 08/01/2019   Diabetes mellitus with neuropathy (HCC) 06/15/2019   Hypokalemia 11/08/2018   CKD (chronic kidney disease) stage 3, GFR 30-59 ml/min (HCC) 11/08/2018   Orthostatic hypotension 07/28/2017   SVD (spontaneous vaginal delivery)    Peripheral neuropathy    On home oxygen therapy    Migraines    Left bundle branch block    Hypothyroidism    Hypertension    Hyperlipidemia    Heart murmur    GERD (gastroesophageal reflux disease)    Exertional shortness of breath    Major depressive disorder    Back pain    Arthritis    Generalized anxiety disorder    Anemia    Chronic respiratory failure (HCC) 09/14/2013   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/04/2013   Morbid obesity (HCC) 11/01/2012   Chronic systolic heart failure (HCC) 10/27/2012   Endometrial polyp 01/20/2012   Malignant tumor of breast (HCC) 03/26/2011   Unspecified vitamin D deficiency 03/26/2011    Current Outpatient Medications:    acetaZOLAMIDE (DIAMOX) 125 MG tablet, Take 1 tablet (125 mg total) by mouth daily., Disp: 90 tablet, Rfl: 3   Ascorbic Acid (VITAMIN C) 1000 MG tablet, Take 1,000 mg by mouth daily., Disp: , Rfl:    buPROPion (WELLBUTRIN XL) 150 MG 24 hr tablet, Take 1 tablet (150 mg total) by mouth daily., Disp: 30 tablet, Rfl: 0   carvedilol (COREG) 3.125 MG tablet, TAKE 1 TABLET (3.125 MG) BY MOUTH TWICE DAILY WITH MEALS, Disp: 180 tablet, Rfl: 3   citalopram (CELEXA) 20 MG tablet, Take 1 tablet (20 mg total) by mouth daily., Disp: 30 tablet, Rfl: 1   clonazePAM (KLONOPIN) 0.5 MG tablet, Take 1 tablet (0.5 mg total) by mouth 2 (two) times daily., Disp: 60 tablet, Rfl: 1   cyanocobalamin (VITAMIN B12) 1000 MCG tablet, Take 1,000 mcg by mouth daily., Disp: , Rfl:    dicyclomine (BENTYL) 20 MG  tablet, Take 20 mg by mouth 3 (three) times daily before meals., Disp: , Rfl:    fluticasone (FLONASE) 50 MCG/ACT nasal spray, Place 1 spray into both nostrils daily., Disp: , Rfl:    gabapentin (NEURONTIN) 100 MG capsule, Take 1 capsule (100 mg total) by mouth 2 (two) times daily., Disp: 180 capsule, Rfl: 3   levothyroxine (SYNTHROID) 88 MCG tablet, TAKE 1 TABLET(88 MCG) BY MOUTH DAILY BEFORE BREAKFAST, Disp: 90 tablet, Rfl: 3   memantine (NAMENDA) 5 MG tablet, Take 1 tablet (5 mg total) by mouth daily., Disp: 30 tablet, Rfl: 3   metolazone (ZAROXOLYN) 2.5 MG tablet, TAKE 1 TABLET BY MOUTH EVERY OTHER TUESDAY., Disp: 12 tablet, Rfl: 3   midodrine (PROAMATINE) 5 MG tablet, Take 1 tablet (5 mg total) by mouth 3 (three) times daily., Disp: 200 tablet, Rfl: 3   Multiple Vitamin (MULTIVITAMIN WITH MINERALS) TABS tablet, Take 1 tablet by mouth daily., Disp: , Rfl:    potassium chloride SA (KLOR-CON M) 20 MEQ tablet, TAKE 2 TABLETS THREE TIMES DAILY. TAKE AN EXTRA 2 TABLETS ON DAYS YOU TAKE METOLAZONE, Disp: 572  tablet, Rfl: 3   risperiDONE (RISPERDAL) 0.5 MG tablet, Take 1 tablet (0.5 mg total) by mouth 2 (two) times daily at 8 am and 4 pm., Disp: 60 tablet, Rfl: 1   simvastatin (ZOCOR) 10 MG tablet, TAKE 1 TABLET EVERY EVENING, Disp: 90 tablet, Rfl: 3   spironolactone (ALDACTONE) 25 MG tablet, TAKE 1 TABLET (25 MG TOTAL) BY MOUTH EVERY EVENING., Disp: 90 tablet, Rfl: 3   torsemide (DEMADEX) 20 MG tablet, Take 4 tablets (80 mg total) by mouth every morning AND 3 tablets (60 mg total) every evening., Disp: 210 tablet, Rfl: 3 Allergies  Allergen Reactions   Ceftin Anaphylaxis    Face and throat swell    Cefuroxime Axetil Anaphylaxis    Face and throat swell   Geodon [Ziprasidone Hcl] Hives   Lisinopril Other (See Comments)    angioedema   Cefuroxime     Other reaction(s): anaphylaxis   Sulfacetamide Sodium-Sulfur     Other reaction(s): itch   Allopurinol Nausea Only and Other (See Comments)     weakness   Ativan [Lorazepam] Itching   Sulfa Antibiotics Itching   Valium [Diazepam] Other (See Comments)    Patient states that diazepam doesn't relax, it has the opposite effect.     Social History   Socioeconomic History   Marital status: Married    Spouse name: Not on file   Number of children: 2   Years of education: 42   Highest education level: Master's degree (e.g., MA, MS, MEng, MEd, MSW, MBA)  Occupational History   Occupation: retired  Tobacco Use   Smoking status: Some Days    Current packs/day: 0.00    Average packs/day: 0.1 packs/day for 26.0 years (2.6 ttl pk-yrs)    Types: Cigarettes    Start date: 03/24/1995    Last attempt to quit: 03/23/2021    Years since quitting: 1.7   Smokeless tobacco: Never  Vaping Use   Vaping status: Never Used  Substance and Sexual Activity   Alcohol use: No   Drug use: No   Sexual activity: Yes  Other Topics Concern   Not on file  Social History Narrative   Tobacco Use Cigarettes: Former Smoker, Quit in 2008   No Alcohol   No recreational drug use   Diet: Regular/Low Carb   Exercise: None   Occupation: disabled   Education: Company secretary, masters   Children: 2   Firearms: No   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      Social Determinants of Health   Financial Resource Strain: Not on file  Food Insecurity: No Food Insecurity (08/11/2022)   Hunger Vital Sign    Worried About Running Out of Food in the Last Year: Never true    Ran Out of Food in the Last Year: Never true  Transportation Needs: No Transportation Needs (08/11/2022)   PRAPARE - Administrator, Civil Service (Medical): No    Lack of Transportation (Non-Medical): No  Physical Activity: Not on file  Stress: No Stress Concern Present (08/11/2022)   [name] of Occupational Health - Occupational Stress Questionnaire    Feeling of Stress : Only a little  Social Connections: Not on file   Intimate Partner Violence: Not At Risk (07/27/2022)   Humiliation, Afraid, Rape, and Kick questionnaire    Fear of Current or Ex-Partner: No    Emotionally Abused: No    Physically Abused: No  Sexually Abused: No    Physical Exam      Future Appointments  Date Time Provider Department Center  12/30/2022  7:00 AM [facility] DEVICE REMOTES [facility]  12/30/2022  2:00 PM [facility] ECHO OP 1 [facility]  12/30/2022  3:00 PM [facility] PA/NP [facility] None  01/28/2023  4:20 PM Arfeen, Phillips Grout, MD [facility] None  02/03/2023 10:45 AM Freddie Breech, DPM [facility]  03/15/2023  7:10 AM [facility] DEVICE REMOTES [facility]  06/02/2023  1:00 PM Rosann Auerbach, PhD [facility] None  06/02/2023  2:00 PM [facility] NEUROPSYCH TECH [facility] None  06/09/2023  2:30 PM Rosann Auerbach, PhD [facility] None  06/14/2023  7:10 AM [facility] DEVICE REMOTES [facility]  06/29/2023  1:30 PM Nita Sickle K, DO [facility] None  09/13/2023  7:10 AM [facility] DEVICE REMOTES [facility]

## 2022-12-21 NOTE — Telephone Encounter (Signed)
Spoke with Rhonda [name] today and she advised that she was out of refills for Namenda, will you please refills to [facility].  Thanks, [name] [facility] EMT-P Community Paramedic  [phone]

## 2022-12-24 ENCOUNTER — Telehealth (HOSPITAL_COMMUNITY): Payer: Self-pay | Admitting: *Deleted

## 2022-12-24 NOTE — Telephone Encounter (Signed)
Will do. Thanks.

## 2022-12-24 NOTE — Telephone Encounter (Signed)
Received a call from Katie who is a Paramedic that is involved in pt continuing care, requesting refills of the Celexa, Klonopin, and Namenda 5 mg every day. Script Cytogeneticist) was sent to pharmacy on 08/07/22 by Dr. Marlou Porch. Pt has refills of the Klonopin and Celexa and will advise caller, but Namenda was not ordered by you. I don't see that pt has seen Neuro since her d/c from [facility]. She is scheduled for NeuroCognitive testing @ [facility] on 06/02/23. Pt next visit with you is scheduled for 01/25/23. Seems there has been a previous request for refill. Do you want me to bridge or does she need to contact [facility]? Please review and advise.

## 2022-12-24 NOTE — Progress Notes (Signed)
Remote ICD transmission.   

## 2022-12-24 NOTE — Telephone Encounter (Signed)
I do not prescribe amantadine.  She need to see a neurologist however we can provide 1 time during supply 30 pills and in the future she need to contact her primary care for seeing neurologist.  Please inform the patient.

## 2022-12-28 ENCOUNTER — Other Ambulatory Visit (HOSPITAL_COMMUNITY): Payer: Self-pay

## 2022-12-28 NOTE — Progress Notes (Signed)
Paramedicine Encounter    Patient ID: Rhonda [name], female    DOB: 1950-11-14, 72 y.o.   MRN: [id number]   Complaints- sore throat secondary to sinus drainage   Assessment- CAOX4, warm and dry ambulatory with complaints of sore throat secondary to sinus drainage X1 month. No lower leg swelling, lungs clear, vitals obtained.   Compliance with meds- one missed noon dose for last week   Pill box filled- for one week   Refills needed-  Gabapentin Risperidone Midodrine Namenda   Meds changes since last visit- none     Social changes- none    VISIT SUMMARY- Arrived for home visit for Rhonda [name] who reports to be feeling okay today other than sore throat which she has had for over a month. She denied any chest pain, dizziness, shortness of breath, swelling. She has been trying to keep up with her weights over the last week.   Weds- 183lbs Fri- 186lbs Sat- 188lbs Mon- 185lbs   Assessment obtained and as noted. Lungs clear. Vitals within normal- she just had taken her noon meds prior to my arrival.   Meds reviewed and confirmed. Pill box filled for one week. Refills as noted called into [facility]. I spoke to PCP RN and PCP agreed to fill Namenda going forward but will not feel the celexa, risperidone or clonazepam. I will have [facility] reach out to [name] for those when needed.   I plan to see Rhonda [name] in the clinic on Wednesday. Home visit complete.   BP 100/68   Pulse 84   Resp 16   Wt 185 lb (83.9 kg)   SpO2 99%   BMI 34.96 kg/m  Weight yesterday-- didn't weigh  Last visit weight-- 184lbs      ACTION: Home visit completed     Patient Care Team: Shon Hale, MD as PCP - General (Family Medicine) Burna Sis, LCSW as Social Worker (Licensed Clinical Social Worker) [name], Noberto Retort, DO as Consulting Physician (Neurology) [name], Paramedic as Paramedic Chanetta Marshall, Meridee Score, MD as Attending Physician (Family Medicine) Clinton Gallant, RN as [facility] Care Management Bensimhon, Bevelyn Buckles, MD as Consulting Physician (Cardiology) Lolly Mustache, Phillips Grout, MD as Consulting Physician (Psychiatry) Candelaria Stagers, DPM as Consulting Physician (Podiatry)  Patient Active Problem List   Diagnosis Date Noted   Major depressive disorder, recurrent episode, severe (HCC) 07/27/2022   MDD (major depressive disorder), recurrent severe, without psychosis (HCC) 07/27/2022   MDD (major depressive disorder) 07/27/2022   Elevated troponin 12/02/2021   Nausea & vomiting 12/01/2021   Acute left ankle pain 10/11/2021   Intractable nausea and vomiting 10/08/2021   AKI (acute kidney injury) (HCC) 10/08/2021   Hyponatremia 10/08/2021   Decreased estrogen level 08/01/2021   Dementia (HCC) 08/01/2021   Frail elderly 08/01/2021   Hyperparathyroidism (HCC) 08/01/2021   Spondylolisthesis 08/01/2021   Varicose veins of bilateral lower extremities with other complications 08/01/2021   Lumbago without sciatica 04/30/2021   Abnormal gait 06/11/2020   Adult failure to thrive syndrome 06/11/2020   Allergic rhinitis 06/11/2020   Anxiety 06/11/2020   Asthma 06/11/2020   Benign intracranial hypertension 06/11/2020   Body mass index (BMI) 45.0-49.9, adult (HCC) 06/11/2020   Bowel incontinence 06/11/2020   Cholelithiasis 06/11/2020   Chronic sinusitis 06/11/2020   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   Daytime somnolence 06/11/2020   Diarrhea of presumed infectious origin 06/11/2020   Edema 06/11/2020   Family history of malignant neoplasm of gastrointestinal tract 06/11/2020   Gout 06/11/2020  History of fall 06/11/2020   History of infectious disease 06/11/2020   Insomnia 06/11/2020   Irregular bowel habits 06/11/2020   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   Lumbar spondylosis with myelopathy 06/11/2020   Macrocytosis 06/11/2020   Mild recurrent major depression (HCC) 06/11/2020   Personal history of colonic polyps 06/11/2020   Personal history of malignant neoplasm  of breast 06/11/2020   Repeated falls 06/11/2020   Mixed anxiety and depressive disorder 06/11/2020   Irritable bowel syndrome 01/04/2020   Spinal stenosis of lumbar region 01/03/2020   Other symptoms and signs involving the musculoskeletal system 09/11/2019   Bilateral leg weakness 08/01/2019   Leg swelling 08/01/2019   Diabetes mellitus with neuropathy (HCC) 06/15/2019   Hypokalemia 11/08/2018   CKD (chronic kidney disease) stage 3, GFR 30-59 ml/min (HCC) 11/08/2018   Orthostatic hypotension 07/28/2017   SVD (spontaneous vaginal delivery)    Peripheral neuropathy    On home oxygen therapy    Migraines    Left bundle branch block    Hypothyroidism    Hypertension    Hyperlipidemia    Heart murmur    GERD (gastroesophageal reflux disease)    Exertional shortness of breath    Major depressive disorder    Back pain    Arthritis    Generalized anxiety disorder    Anemia    Chronic respiratory failure (HCC) 09/14/2013   Biventricular ICD (implantable cardioverter-defibrillator) in place 08/04/2013   Morbid obesity (HCC) 11/01/2012   Chronic systolic heart failure (HCC) 10/27/2012   Endometrial polyp 01/20/2012   Malignant tumor of breast (HCC) 03/26/2011   Unspecified vitamin D deficiency 03/26/2011    Current Outpatient Medications:    acetaZOLAMIDE (DIAMOX) 125 MG tablet, Take 1 tablet (125 mg total) by mouth daily., Disp: 90 tablet, Rfl: 3   Ascorbic Acid (VITAMIN C) 1000 MG tablet, Take 1,000 mg by mouth daily., Disp: , Rfl:    buPROPion (WELLBUTRIN XL) 150 MG 24 hr tablet, Take 1 tablet (150 mg total) by mouth daily., Disp: 30 tablet, Rfl: 0   carvedilol (COREG) 3.125 MG tablet, TAKE 1 TABLET (3.125 MG) BY MOUTH TWICE DAILY WITH MEALS, Disp: 180 tablet, Rfl: 3   citalopram (CELEXA) 20 MG tablet, Take 1 tablet (20 mg total) by mouth daily., Disp: 30 tablet, Rfl: 1   clonazePAM (KLONOPIN) 0.5 MG tablet, Take 1 tablet (0.5 mg total) by mouth 2 (two) times daily., Disp: 60  tablet, Rfl: 1   cyanocobalamin (VITAMIN B12) 1000 MCG tablet, Take 1,000 mcg by mouth daily., Disp: , Rfl:    dicyclomine (BENTYL) 20 MG tablet, Take 20 mg by mouth 3 (three) times daily before meals., Disp: , Rfl:    fluticasone (FLONASE) 50 MCG/ACT nasal spray, Place 1 spray into both nostrils daily., Disp: , Rfl:    gabapentin (NEURONTIN) 100 MG capsule, Take 1 capsule (100 mg total) by mouth 2 (two) times daily., Disp: 180 capsule, Rfl: 3   levothyroxine (SYNTHROID) 88 MCG tablet, TAKE 1 TABLET(88 MCG) BY MOUTH DAILY BEFORE BREAKFAST, Disp: 90 tablet, Rfl: 3   memantine (NAMENDA) 5 MG tablet, Take 1 tablet (5 mg total) by mouth daily., Disp: 30 tablet, Rfl: 3   metolazone (ZAROXOLYN) 2.5 MG tablet, TAKE 1 TABLET BY MOUTH EVERY OTHER TUESDAY., Disp: 12 tablet, Rfl: 3   midodrine (PROAMATINE) 5 MG tablet, Take 1 tablet (5 mg total) by mouth 3 (three) times daily., Disp: 200 tablet, Rfl: 3   Multiple Vitamin (MULTIVITAMIN WITH MINERALS) TABS tablet, Take 1 tablet  by mouth daily., Disp: , Rfl:    potassium chloride SA (KLOR-CON M) 20 MEQ tablet, TAKE 2 TABLETS THREE TIMES DAILY. TAKE AN EXTRA 2 TABLETS ON DAYS YOU TAKE METOLAZONE, Disp: 572 tablet, Rfl: 3   risperiDONE (RISPERDAL) 0.5 MG tablet, Take 1 tablet (0.5 mg total) by mouth 2 (two) times daily at 8 am and 4 pm., Disp: 60 tablet, Rfl: 1   simvastatin (ZOCOR) 10 MG tablet, TAKE 1 TABLET EVERY EVENING, Disp: 90 tablet, Rfl: 3   spironolactone (ALDACTONE) 25 MG tablet, TAKE 1 TABLET (25 MG TOTAL) BY MOUTH EVERY EVENING., Disp: 90 tablet, Rfl: 3   torsemide (DEMADEX) 20 MG tablet, Take 4 tablets (80 mg total) by mouth every morning AND 3 tablets (60 mg total) every evening., Disp: 210 tablet, Rfl: 3 Allergies  Allergen Reactions   Ceftin Anaphylaxis    Face and throat swell    Cefuroxime Axetil Anaphylaxis    Face and throat swell   Geodon [Ziprasidone Hcl] Hives   Lisinopril Other (See Comments)    angioedema   Cefuroxime     Other  reaction(s): anaphylaxis   Sulfacetamide Sodium-Sulfur     Other reaction(s): itch   Allopurinol Nausea Only and Other (See Comments)    weakness   Ativan [Lorazepam] Itching   Sulfa Antibiotics Itching   Valium [Diazepam] Other (See Comments)    Patient states that diazepam doesn't relax, it has the opposite effect.     Social History   Socioeconomic History   Marital status: Married    Spouse name: Not on file   Number of children: 2   Years of education: 48   Highest education level: Master's degree (e.g., MA, MS, MEng, MEd, MSW, MBA)  Occupational History   Occupation: retired  Tobacco Use   Smoking status: Some Days    Current packs/day: 0.00    Average packs/day: 0.1 packs/day for 26.0 years (2.6 ttl pk-yrs)    Types: Cigarettes    Start date: 03/24/1995    Last attempt to quit: 03/23/2021    Years since quitting: 1.7   Smokeless tobacco: Never  Vaping Use   Vaping status: Never Used  Substance and Sexual Activity   Alcohol use: No   Drug use: No   Sexual activity: Yes  Other Topics Concern   Not on file  Social History Narrative   Tobacco Use Cigarettes: Former Smoker, Quit in 2008   No Alcohol   No recreational drug use   Diet: Regular/Low Carb   Exercise: None   Occupation: disabled   Education: Company secretary, masters   Children: 2   Firearms: No   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      Social Determinants of Health   Financial Resource Strain: Not on file  Food Insecurity: No Food Insecurity (08/11/2022)   Hunger Vital Sign    Worried About Running Out of Food in the Last Year: Never true    Ran Out of Food in the Last Year: Never true  Transportation Needs: No Transportation Needs (08/11/2022)   PRAPARE - Administrator, Civil Service (Medical): No    Lack of Transportation (Non-Medical): No  Physical Activity: Not on file  Stress: No Stress Concern Present (08/11/2022)   [name]  of Occupational Health - Occupational Stress Questionnaire    Feeling of Stress : Only a little  Social Connections: Not on file  Intimate  Partner Violence: Not At Risk (07/27/2022)   Humiliation, Afraid, Rape, and Kick questionnaire    Fear of Current or Ex-Partner: No    Emotionally Abused: No    Physically Abused: No    Sexually Abused: No    Physical Exam      Future Appointments  Date Time Provider Department Center  12/30/2022  7:00 AM [facility] DEVICE REMOTES [facility]  12/30/2022  2:00 PM [facility] ECHO OP 1 [facility]  12/30/2022  3:00 PM [facility] PA/NP [facility] None  01/21/2023  1:45 PM Ashok Croon, MD [facility] None  01/28/2023  4:20 PM Arfeen, Phillips Grout, MD [facility] None  02/03/2023 10:45 AM Freddie Breech, DPM [facility]  03/15/2023  7:10 AM [facility] DEVICE REMOTES [facility]  06/02/2023  1:00 PM Rosann Auerbach, PhD [facility] None  06/02/2023  2:00 PM [facility] NEUROPSY[facility] TECH [facility] None  06/09/2023  2:30 PM Rosann Auerbach, PhD [facility] None  06/14/2023  7:10 AM [facility] DEVICE REMOTES [facility]  06/29/2023  1:30 PM Nita Sickle K, DO [facility] None  09/13/2023  7:10 AM [facility] DEVICE REMOTES [facility]

## 2022-12-29 ENCOUNTER — Other Ambulatory Visit (HOSPITAL_COMMUNITY): Payer: Self-pay | Admitting: Psychiatry

## 2022-12-29 DIAGNOSIS — F330 Major depressive disorder, recurrent, mild: Secondary | ICD-10-CM

## 2022-12-29 NOTE — Progress Notes (Signed)
ADVANCED HF CLINIC NOTE   Patient ID: Rhonda [name], female   DOB: 01/12/1951, 72 y.o.   MRN: [id number]  Primary Cardiologist: Dr Mayford Knife  General Surgeon: Dr Dwain Sarna  Orthopedic: Dr Charlann Boxer  PCP: Dr Hyman Hopes HF Cardiologist: Dr. Gala Romney  HPI: Rhonda [name] is a 72 y.o. 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 She has had sleep study with no  evidence of sleep apnea in remote past.  She has a Medtronic CRT-D device. Echo in 7/18 showed recovery of EF to 55-60%. PYP 09/02/17 negative for TTR (Grade 0-1, H/CCL 1.2). SPEP with no M-spike.   Has been followed by Nita Sickle in Neurology. Has been on Florinef and midodrine for orthostatic hypotension (failed mestinon).   Seen by Dr. Marisue Humble and SCr slightly worse so Florinef stopped. Echo 1/23 EF 60-65%, RV ok  Follow up 2/23, SCr trending up and metolazone changed to every other Tuesday.   Admitted 5/23 with AKI. Resuscitated with IVF, Florinef and midodrine restarted. SCr stabilized at 1.6-1.8.  Last seen in the [facility] 8/23. Was doing well at the time and enrolled w/ paramedicine. Also followed in ICM Heart failure device Clinic.   Follow up 7/24, volume mildly up and BP elevated. Midodrine decreased to 5 bid, repeat echo arranged.  Today she returns for HF follow up with her husband and Herbert Seta with paramedicine. Overall feeling fine. She has a chronic sore throat. She has SOB walking fast on flat ground.She has occasional palpitations. Generally sedentary at home, husband encourages her to walk the driveway. Memory is declining. Denies CP, dizziness, edema, or PND/Orthopnea. Appetite ok. No fever or chills.  Weight at home 184 pounds. Taking all medications.   Echo today 12/30/22, official results pending but EF appears stable compared to prior ~60%  Cardiac Studies  - Echo (5/14): EF 35% RV ok  - Echo (11/14): EF 20-25%, LV moderately dilated - Echo (5/15): EF 45-50% - Echo (7/18): EF 55-60%, normal RV size and systolic function, PASP 34 mmHg - PYP (4/19): negative TTR - Echo (1/21): EF 60-65% grade II DD. RV ok - Echo (1/23): EF 60-65%, RV ok     7/14 Creatinine 1.2, K 3.8 10/14 Creatinine 1.68, K 4.4  12/14 Creatinine 1.8, K 4.2  5/15 Creatinine 1.69, K 3.7  10/17 Creatinine 1.35, K 4.1 5/19 Creatinine 1.57, K 3.8  Review of systems complete and found to be negative unless listed in HPI.   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    Complication of anesthesia    History of low blood pressure after surgery; attributed to lying flat  Diabetes mellitus    "diet controlled" (05/03/2013)   Exertional shortness of breath    Generalized anxiety disorder    GERD (gastroesophageal reflux disease)    Gout    Heart murmur    Hyperlipidemia    Hypertension    Hypokalemia 11/08/2018   Hypothyroidism    Left bundle branch block    s/p CRT-D (04/2013)   Major depressive disorder    Migraines    On home oxygen therapy    "2L suppose to be q night" (05/03/2013)   Orthostatic hypotension 07/28/2017   Peripheral neuropathy    Feet   SVD (spontaneous vaginal delivery)    x 2   Unspecified vitamin D deficiency  03/26/2011   Does not take meds   Current Outpatient Medications  Medication Sig Dispense Refill   acetaZOLAMIDE (DIAMOX) 125 MG tablet Take 1 tablet (125 mg total) by mouth daily. 90 tablet 3   Ascorbic Acid (VITAMIN C) 1000 MG tablet Take 1,000 mg by mouth daily.     buPROPion (WELLBUTRIN XL) 150 MG 24 hr tablet Take 1 tablet (150 mg total) by mouth daily. 30 tablet 0   carvedilol (COREG) 3.125 MG tablet TAKE 1 TABLET (3.125 MG) BY MOUTH TWICE DAILY WITH MEALS 180 tablet 3   citalopram (CELEXA) 20 MG tablet Take 1 tablet (20 mg total) by mouth daily. 30 tablet 1   clonazePAM (KLONOPIN) 0.5 MG tablet Take 1 tablet (0.5 mg total) by mouth 2 (two) times daily. 60 tablet 1   cyanocobalamin (VITAMIN B12) 1000 MCG tablet Take 1,000 mcg by mouth daily.     dicyclomine (BENTYL) 20 MG tablet Take 20 mg by mouth 3 (three) times daily before meals.     fluticasone (FLONASE) 50 MCG/ACT nasal spray Place 1 spray into both nostrils daily.     gabapentin (NEURONTIN) 100 MG capsule Take 1 capsule (100 mg total) by mouth 2 (two) times daily. 180 capsule 3   levothyroxine (SYNTHROID) 88 MCG tablet TAKE 1 TABLET(88 MCG) BY MOUTH DAILY BEFORE BREAKFAST 90 tablet 3   memantine (NAMENDA) 5 MG tablet Take 1 tablet (5 mg total) by mouth daily. 30 tablet 3   metolazone (ZAROXOLYN) 2.5 MG tablet TAKE 1 TABLET BY MOUTH EVERY OTHER TUESDAY. 12 tablet 3   midodrine (PROAMATINE) 5 MG tablet Take 1 tablet (5 mg total) by mouth 3 (three) times daily. 200 tablet 3   Multiple Vitamin (MULTIVITAMIN WITH MINERALS) TABS tablet Take 1 tablet by mouth daily.     Ondansetron HCl (ZOFRAN PO) 4 mg. Take 1 tablet on the tongue and allow to dissolve     potassium chloride SA (KLOR-CON M) 20 MEQ tablet TAKE 2 TABLETS THREE TIMES DAILY. TAKE AN EXTRA 2 TABLETS ON DAYS YOU TAKE METOLAZONE 572 tablet 3   risperiDONE (RISPERDAL) 0.5 MG tablet Take 1 tablet (0.5 mg total) by mouth 2 (two) times daily at 8 am and 4 pm. 60 tablet 1    simvastatin (ZOCOR) 10 MG tablet TAKE 1 TABLET EVERY EVENING 90 tablet 3   spironolactone (ALDACTONE) 25 MG tablet TAKE 1 TABLET (25 MG TOTAL) BY MOUTH EVERY EVENING. 90 tablet 3   torsemide (DEMADEX) 20 MG tablet Take 4 tablets (80 mg total) by mouth every morning AND 3 tablets (60 mg total) every evening. 210 tablet 3   No current facility-administered medications for this encounter.   BP 96/66   Pulse 83   Wt 83.7 kg (184 lb 9.6 oz)   SpO2 100%  BMI 34.88 kg/m   Wt Readings from Last 3 Encounters:  12/30/22 83.7 kg (184 lb 9.6 oz)  12/28/22 83.9 kg (185 lb)  12/21/22 83.5 kg (184 lb 1.6 oz)    PHYSICAL EXAM: General:  NAD. No resp difficulty, chronically-ill appearing. HEENT: Normal Neck: Supple. No JVD, thick neck. Carotids 2+ bilat; no bruits. No lymphadenopathy or thryomegaly appreciated. Cor: PMI nondisplaced. Regular rate & rhythm. No rubs, gallops or murmurs. Lungs: Clear, faint crackle RLL Abdomen: Soft, obese, nontender, nondistended. No hepatosplenomegaly. No bruits or masses. Good bowel sounds. Extremities: No cyanosis, clubbing, rash, 1+ BLE edema Neuro: Alert & oriented x 3, cranial nerves grossly intact. Moves all 4 extremities w/o difficulty. Affect pleasant.  Device interrogation (personally reviewed): OptiVol up, thoracic impedence down, no AF, 0.2 hr/day activity, no VT  ASSESSMENT & PLAN: 1) Chronic systolic HF with recovered EF/nonischemic cardiomyopathy:  - NICM s/p Medtronic CRT-D, ? Viral myocarditis. Cardiomyopathy dates from 71.  - Echo (5/15): EF 45-50% - Echo (2020): EF up to 55-60%.  - PYP (4/19) negative for TTR (Grade 0-1, H/CCL 1.2). SPEP with no M-spike.  - Echo (1/21): EF 60-65% grade II DD. RV ok.  - Echo (1/23): EF 60-65% RV normal.  - NYHA IIb, functional status limited by deconditioning and depression. Volume up on OptiVol, weight up. - Take metolazone 2.5 mg + 40 KCL x 1 today. Continue metolazone 2.5 mg/40 KCL every other Tuesday. -  Increase torsemide to 80 mg bid. - Continue carvedilol 3.125 mg bid. - Continue spironolactone 25 mg daily.  - Continue midodrine 5 mg tid. May need to increase to allow more BP room for diuresis. - No SGT2i with chronic yeast infections. - C/w paramedicine, she is enrolled in ICM heart failure device clinic -Needs to move more/increase physical activity. Encouraged she return to [employer] program at [facility]. - Labs today. BMET in 10-14 days.  2) Obesity - Body mass index is 34.88 kg/m. - Encouraged PREP program.  3) HTN - BP stable, now on the lower side - GDMT per above  4) Orthostatic Hypotension.  - Neurology managing. She is on diamox for increased ICP - Continue midodrine  5) Chronic Venous stasis - Compression stockings and leg elevation.   6) Falls  - No recent falls. - Follows with Neurology.   7) CKD 3 - Baseline SCr 1.4-1.6 - Follows with Dr. Marisue Humble in Nephrology - Labs today.  Follow up in 6 months with Dr. Gala Romney.  Anderson Malta Derren Suydam FNP-BC 2:54 PM

## 2022-12-30 ENCOUNTER — Ambulatory Visit (INDEPENDENT_AMBULATORY_CARE_PROVIDER_SITE_OTHER): Payer: Medicare HMO

## 2022-12-30 ENCOUNTER — Ambulatory Visit (HOSPITAL_BASED_OUTPATIENT_CLINIC_OR_DEPARTMENT_OTHER)
Admission: RE | Admit: 2022-12-30 | Discharge: 2022-12-30 | Disposition: A | Discharge status: Home / Self Care | Payer: Medicare HMO | Source: Ambulatory Visit

## 2022-12-30 ENCOUNTER — Encounter (HOSPITAL_COMMUNITY): Payer: Self-pay

## 2022-12-30 ENCOUNTER — Other Ambulatory Visit (HOSPITAL_COMMUNITY): Payer: Self-pay

## 2022-12-30 ENCOUNTER — Ambulatory Visit (HOSPITAL_COMMUNITY)
Admission: RE | Admit: 2022-12-30 | Discharge: 2022-12-30 | Disposition: A | Discharge status: Home / Self Care | Payer: Medicare HMO | Source: Ambulatory Visit | Attending: Family Medicine | Admitting: Family Medicine

## 2022-12-30 VITALS — BP 96/66 | HR 83 | Wt 184.6 lb

## 2022-12-30 DIAGNOSIS — E1122 Type 2 diabetes mellitus with diabetic chronic kidney disease: Secondary | ICD-10-CM | POA: Insufficient documentation

## 2022-12-30 DIAGNOSIS — R002 Palpitations: Secondary | ICD-10-CM | POA: Insufficient documentation

## 2022-12-30 DIAGNOSIS — I872 Venous insufficiency (chronic) (peripheral): Secondary | ICD-10-CM | POA: Diagnosis not present

## 2022-12-30 DIAGNOSIS — N183 Chronic kidney disease, stage 3 unspecified: Secondary | ICD-10-CM | POA: Diagnosis not present

## 2022-12-30 DIAGNOSIS — Z95810 Presence of automatic (implantable) cardiac defibrillator: Secondary | ICD-10-CM | POA: Diagnosis not present

## 2022-12-30 DIAGNOSIS — I428 Other cardiomyopathies: Secondary | ICD-10-CM | POA: Diagnosis not present

## 2022-12-30 DIAGNOSIS — I5032 Chronic diastolic (congestive) heart failure: Secondary | ICD-10-CM | POA: Diagnosis not present

## 2022-12-30 DIAGNOSIS — F32A Depression, unspecified: Secondary | ICD-10-CM | POA: Diagnosis not present

## 2022-12-30 DIAGNOSIS — I951 Orthostatic hypotension: Secondary | ICD-10-CM

## 2022-12-30 DIAGNOSIS — J9610 Chronic respiratory failure, unspecified whether with hypoxia or hypercapnia: Secondary | ICD-10-CM | POA: Diagnosis not present

## 2022-12-30 DIAGNOSIS — N1832 Chronic kidney disease, stage 3b: Secondary | ICD-10-CM | POA: Diagnosis not present

## 2022-12-30 DIAGNOSIS — Z79899 Other long term (current) drug therapy: Secondary | ICD-10-CM | POA: Diagnosis not present

## 2022-12-30 DIAGNOSIS — Z9181 History of falling: Secondary | ICD-10-CM

## 2022-12-30 DIAGNOSIS — F419 Anxiety disorder, unspecified: Secondary | ICD-10-CM | POA: Insufficient documentation

## 2022-12-30 DIAGNOSIS — I5022 Chronic systolic (congestive) heart failure: Secondary | ICD-10-CM

## 2022-12-30 DIAGNOSIS — G932 Benign intracranial hypertension: Secondary | ICD-10-CM | POA: Insufficient documentation

## 2022-12-30 DIAGNOSIS — I130 Hypertensive heart and chronic kidney disease with heart failure and stage 1 through stage 4 chronic kidney disease, or unspecified chronic kidney disease: Secondary | ICD-10-CM | POA: Insufficient documentation

## 2022-12-30 DIAGNOSIS — I10 Essential (primary) hypertension: Secondary | ICD-10-CM | POA: Diagnosis not present

## 2022-12-30 DIAGNOSIS — Z6834 Body mass index (BMI) 34.0-34.9, adult: Secondary | ICD-10-CM | POA: Diagnosis not present

## 2022-12-30 LAB — ECHOCARDIOGRAM COMPLETE
AR max vel: 1.79 cm2
AV Area VTI: 1.45 cm2
AV Area mean vel: 1.61 cm2
AV Mean grad: 4 mmHg
AV Peak grad: 6.3 mmHg
Ao pk vel: 1.25 m/s
Area-P 1/2: 3.21 cm2
S' Lateral: 2.6 cm

## 2022-12-30 LAB — BASIC METABOLIC PANEL
Anion gap: 11 (ref 5–15)
BUN: 31 mg/dL — ABNORMAL HIGH (ref 8–23)
CO2: 28 mmol/L (ref 22–32)
Calcium: 9.2 mg/dL (ref 8.9–10.3)
Chloride: 95 mmol/L — ABNORMAL LOW (ref 98–111)
Creatinine, Ser: 1.64 mg/dL — ABNORMAL HIGH (ref 0.44–1.00)
GFR, Estimated: 33 mL/min — ABNORMAL LOW (ref >60)
Glucose, Bld: 112 mg/dL — ABNORMAL HIGH (ref 70–99)
Potassium: 3.6 mmol/L (ref 3.5–5.1)
Sodium: 134 mmol/L — ABNORMAL LOW (ref 135–145)

## 2022-12-30 LAB — BRAIN NATRIURETIC PEPTIDE: B Natriuretic Peptide: 40.1 pg/mL (ref 0.0–100.0)

## 2022-12-30 MED ORDER — TORSEMIDE 20 MG PO TABS: 80.0000 mg | ORAL_TABLET | Freq: Two times a day (BID) | ORAL | 6 refills | Status: DC

## 2022-12-30 NOTE — Progress Notes (Signed)
Paramedicine Encounter  Patient ID: Rhonda [name], female, DOB: 1950/08/18, 72 y.o.,  MRN: [id number]  Met patient in clinic today with provider Anderson Malta. Who assessed patient and discussed recent fluid retention according to her device and made some medication adjustments.    Weight @ clinic-184.6lbs B/P-96/66 P-83 SP02-100%  Labs taken today.    Med changes:  Single dose of metolazone today with 2 extra potassium (given by me in clinic)  Increase Torsemide to 80mg  BID  Keep Potassium the same  Keep Metolazone Bi-weekly the same   I filled pill box accordingly and plan to see Daneli in the home on Monday as planned.  We discussed exercise and the need for her to get back engaged in community events- she wants to apply for SCAT transportation to help her since her husband works often and her daughters are unable to asssist. I will forward this to [place] to have assist myself and [name] with same.   Visit complete.      [name], EMT-Paramedic [phone] 12/30/2022

## 2022-12-30 NOTE — Patient Instructions (Addendum)
Thank you for coming in today  If you had labs drawn today, any labs that are abnormal the clinic will call you No news is good news  Medications: TAKE Metolazone 2.5 mg with Potassium 40 meq today Increase Torsemide 80 mg twice daily   Follow up appointments:  Your physician recommends that you schedule a follow-up appointment in:  4-6 months With Dr. Gala Romney You will receive a reminder letter in the mail a few months in advance. If you don't receive a letter, please call our office to schedule the follow-up appointment.    Do the following things EVERYDAY: Weigh yourself in the morning before breakfast. Write it down and keep it in a log. Take your medicines as prescribed Eat low salt foods--Limit salt (sodium) to 2000 mg per day.  Stay as active as you can everyday Limit all fluids for the day to less than 2 liters   At the [facility], you and your health needs are our priority. As part of our continuing mission to provide you with exceptional heart care, we have created designated Provider Care Teams. These Care Teams include your primary Cardiologist (physician) and Advanced Practice Providers (APPs- Physician Assistants and Nurse Practitioners) who all work together to provide you with the care you need, when you need it.   You may see any of the following providers on your designated Care Team at your next follow up: Dr Arvilla Meres Dr Marca Ancona Dr. Marcos Eke, NP Robbie Lis, [place] [facility] [place], [place] Brynda Peon, NP Karle Plumber, PharmD   Please be sure to bring in all your medications bottles to every appointment.    Thank you for choosing [facility]  If you have any questions or concerns before your next appointment please send [place] a message through [place] or call our office at [phone].    TO LEAVE A MESSAGE FOR THE NURSE SELECT OPTION 2, PLEASE LEAVE A  MESSAGE INCLUDING: YOUR NAME DATE OF BIRTH CALL BACK NUMBER REASON FOR CALL**this is important as we prioritize the call backs  YOU WILL RECEIVE A CALL BACK THE SAME DAY AS LONG AS YOU CALL BEFORE 4:00 PM

## 2022-12-31 ENCOUNTER — Telehealth (HOSPITAL_COMMUNITY): Payer: Self-pay | Admitting: Licensed Clinical Social Worker

## 2022-12-31 NOTE — Telephone Encounter (Signed)
CSW received message to contact patient regarding application for SCAT. CSW spoke with patient's husband who shared she would like to apply for SCAT. CSW will mail application to patient's home to start the process. Patient has a follow up appointment in a few weeks and will return application for completion by staff. Husband grateful for the assistance and will call if needs arise. Lasandra Beech, LCSW, CCSW-MCS [phone]

## 2022-12-31 NOTE — Progress Notes (Signed)
EPIC Encounter for ICM Monitoring  Patient Name: Rhonda [name] is a 72 y.o. female Date: 12/31/2022 Primary Care Physican: Shon Hale, MD Primary Cardiologist: Bensimhon Electrophysiologist: Rae Roam Pacing: 98.7%   06/01/2022 Weight: 193 lbs  07/08/2022 Weight: 183 lbs 08/10/2022 Weight: 185 lbs 11/25/2022 Weight: 180 lbs 12/30/2022 Weight: 184 lbs          Pt seen in Advance HF clinic 8/7 and recommendations given.                Optivol thoracic impedance suggesting possible fluid accumulation starting 7/16.   Prescribed:  Patient participating in paramedicine program to assist with meds. Torsemide 20 mg 4 tablets (80 mg total) by mouth twice a day (increased 8/7)  Potassium 20 meq 2 tablets (40 mEq total) by mouth 3 (three) times daily.  Take extra 2 tablets on days you take Metolazone.   Metolazone 2.5mg  1 tablet every other Tuesday.  (dosage due 01/12/23) Spironolactone 25 mg take 1 tablet by mouth every evening.   Labs: 01/13/2023 BMET Scheduled at HF clinic 12/30/2022 Creatinine 1.64, BUN 31, Potassium 3.6, Sodium 134, GFR 33 12/02/2022 Creatinine 1.68 BUN 23,  Potassium 4.0, Sodium 136, GFR 33 08/01/2022 Creatinine 1.47, BUN 42, Potassium 3.1, Sodium 136, GFR 38  07/26/2022 Creatinine 2.02, BUN 25, Potassium 4.6, Sodium 133, GFR 26  07/22/2022 Creatinine 1.57, BUN 20, Potassium 3.8, Sodium 136, GFR 35  07/14/2022 Creatinine 1.32, BUN 20, Potassium 4.2, Sodium 138, GFR 44 06/25/2022 Creatinine 2.19, BUN 29, Potassium 3.5, Sodium 134, GFR 24 A complete set of results can be found in Results Review.   Recommendations:  Pt seen in Advance HF clinic 8/7 and recommendations given.    Follow-up plan: ICM clinic phone appointment on 01/11/2023 to recheck fluid levels.   91 day device clinic remote transmission 03/15/2023.     EP/Cardiology Office Visits:  Recall 05/29/2023 with Dr Gala Romney.  Recall 03/20/2023 with Dr Ladona Ridgel.     Copy of ICM check sent to Dr. Ladona Ridgel.   3  month ICM trend: 12/30/2022.    12-14 Month ICM trend:     Karie Soda, RN 12/31/2022 12:56 PM

## 2023-01-04 ENCOUNTER — Other Ambulatory Visit (HOSPITAL_COMMUNITY): Payer: Self-pay | Admitting: Emergency Medicine

## 2023-01-04 NOTE — Progress Notes (Signed)
Paramedicine Encounter    Patient ID: Rhonda [name], female    DOB: 03-Jul-1950, 72 y.o.   MRN: [id number]   Complaints -  gout pain, chronic dizziness, neuropathy  Assessment - Ambulatory, Alert and oriented to her baseline. Denies CP, shortness of breath, reports dizziness but no increase from her chronic condition of same. Lung sounds clear and no edema noted.   Compliance with meds - Missed 2 noon doses, missed 1 evening dose.   Pill box filled - for 2 weeks   Refills needed - Allegra (OTC) and Spironolactone.  Meds changes since last visit - none     Social changes - none    VISIT SUMMARY**  Met with Rhonda [name] in the home today. Pt was ambulating around her home upon arrival. Pt had not been compliant with weight charts despite posting them to the wall, and did not weigh yesterday. Pt reported chronic dizziness and low energy. Pt had missed 1 evening and 2 noon doses since last week. Pt was assessed as noted and reported some increased neuropathy in her feet, describing them as "pins and needles". Pt also reported some gout pain. Pt denied any chest pain or shortness of breath. Pt was made aware of my upcoming absence next week and was set up for 2x weeks, with plans to follow up on 8/26. Pt was informed of resources for help in my absence. Pill box was filled for 2x weeks. Pt and husband were made aware of upcoming lab appointment. Will follow up with pt in 2x weeks.    BP 118/80   Pulse 88   Resp 16   Wt 179 lb 11.2 oz (81.5 kg)   SpO2 98%   BMI 33.95 kg/m  Weight yesterday-DNW Last visit weight- 184.6 lbs     ACTION: Home visit completed  [name] [facility] EMT-P Community Paramedic  [phone]       Patient Care Team: Shon Hale, MD as PCP - General (Family Medicine) Burna Sis, LCSW as Social Worker (Licensed Clinical Social Worker) [name], Noberto Retort, DO as Consulting Physician (Neurology) [name], Paramedic as Paramedic Chanetta Marshall,  Meridee Score, MD as Attending Physician (Family Medicine) Clinton Gallant, RN as [facility] Care Management Bensimhon, Bevelyn Buckles, MD as Consulting Physician (Cardiology) Lolly Mustache, Phillips Grout, MD as Consulting Physician (Psychiatry) Candelaria Stagers, DPM as Consulting Physician (Podiatry)  Patient Active Problem List   Diagnosis Date Noted   Major depressive disorder, recurrent episode, severe (HCC) 07/27/2022   MDD (major depressive disorder), recurrent severe, without psychosis (HCC) 07/27/2022   MDD (major depressive disorder) 07/27/2022   Elevated troponin 12/02/2021   Nausea & vomiting 12/01/2021   Acute left ankle pain 10/11/2021   Intractable nausea and vomiting 10/08/2021   AKI (acute kidney injury) (HCC) 10/08/2021   Hyponatremia 10/08/2021   Decreased estrogen level 08/01/2021   Dementia (HCC) 08/01/2021   Frail elderly 08/01/2021   Hyperparathyroidism (HCC) 08/01/2021   Spondylolisthesis 08/01/2021   Varicose veins of bilateral lower extremities with other complications 08/01/2021   Lumbago without sciatica 04/30/2021   Abnormal gait 06/11/2020   Adult failure to thrive syndrome 06/11/2020   Allergic rhinitis 06/11/2020   Anxiety 06/11/2020   Asthma 06/11/2020   Benign intracranial hypertension 06/11/2020   Body mass index (BMI) 45.0-49.9, adult (HCC) 06/11/2020   Bowel incontinence 06/11/2020   Cholelithiasis 06/11/2020   Chronic sinusitis 06/11/2020   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   Daytime somnolence 06/11/2020   Diarrhea of presumed infectious origin  06/11/2020   Edema 06/11/2020   Family history of malignant neoplasm of gastrointestinal tract 06/11/2020   Gout 06/11/2020   History of fall 06/11/2020   History of infectious disease 06/11/2020   Insomnia 06/11/2020   Irregular bowel habits 06/11/2020   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   Lumbar spondylosis with myelopathy 06/11/2020   Macrocytosis 06/11/2020   Mild recurrent major depression (HCC) 06/11/2020    Personal history of colonic polyps 06/11/2020   Personal history of malignant neoplasm of breast 06/11/2020   Repeated falls 06/11/2020   Mixed anxiety and depressive disorder 06/11/2020   Irritable bowel syndrome 01/04/2020   Spinal stenosis of lumbar region 01/03/2020   Other symptoms and signs involving the musculoskeletal system 09/11/2019   Bilateral leg weakness 08/01/2019   Leg swelling 08/01/2019   Diabetes mellitus with neuropathy (HCC) 06/15/2019   Hypokalemia 11/08/2018   CKD (chronic kidney disease) stage 3, GFR 30-59 ml/min (HCC) 11/08/2018   Orthostatic hypotension 07/28/2017   SVD (spontaneous vaginal delivery)    Peripheral neuropathy    On home oxygen therapy    Migraines    Left bundle branch block    Hypothyroidism    Hypertension    Hyperlipidemia    Heart murmur    GERD (gastroesophageal reflux disease)    Exertional shortness of breath    Major depressive disorder    Back pain    Arthritis    Generalized anxiety disorder    Anemia    Chronic respiratory failure (HCC) 09/14/2013   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/04/2013   Morbid obesity (HCC) 11/01/2012   Chronic systolic heart failure (HCC) 10/27/2012   Endometrial polyp 01/20/2012   Malignant tumor of breast (HCC) 03/26/2011   Unspecified vitamin D deficiency 03/26/2011    Current Outpatient Medications:    acetaZOLAMIDE (DIAMOX) 125 MG tablet, Take 1 tablet (125 mg total) by mouth daily., Disp: 90 tablet, Rfl: 3   Ascorbic Acid (VITAMIN C) 1000 MG tablet, Take 1,000 mg by mouth daily., Disp: , Rfl:    buPROPion (WELLBUTRIN XL) 150 MG 24 hr tablet, Take 1 tablet (150 mg total) by mouth daily., Disp: 30 tablet, Rfl: 0   carvedilol (COREG) 3.125 MG tablet, TAKE 1 TABLET (3.125 MG) BY MOUTH TWICE DAILY WITH MEALS, Disp: 180 tablet, Rfl: 3   citalopram (CELEXA) 20 MG tablet, Take 1 tablet (20 mg total) by mouth daily., Disp: 30 tablet, Rfl: 1   clonazePAM (KLONOPIN)  0.5 MG tablet, Take 1 tablet (0.5 mg total) by mouth 2 (two) times daily., Disp: 60 tablet, Rfl: 1   cyanocobalamin (VITAMIN B12) 1000 MCG tablet, Take 1,000 mcg by mouth daily., Disp: , Rfl:    dicyclomine (BENTYL) 20 MG tablet, Take 20 mg by mouth 3 (three) times daily before meals., Disp: , Rfl:    fluticasone (FLONASE) 50 MCG/ACT nasal spray, Place 1 spray into both nostrils daily., Disp: , Rfl:    gabapentin (NEURONTIN) 100 MG capsule, Take 1 capsule (100 mg total) by mouth 2 (two) times daily., Disp: 180 capsule, Rfl: 3   levothyroxine (SYNTHROID) 88 MCG tablet, TAKE 1 TABLET(88 MCG) BY MOUTH DAILY BEFORE BREAKFAST, Disp: 90 tablet, Rfl: 3   memantine (NAMENDA) 5 MG tablet, Take 1 tablet (5 mg total) by mouth daily., Disp: 30 tablet, Rfl: 3   metolazone (ZAROXOLYN) 2.5 MG tablet, TAKE 1 TABLET BY MOUTH EVERY OTHER TUESDAY., Disp: 12 tablet, Rfl: 3   midodrine (PROAMATINE) 5 MG tablet, Take 1 tablet (5 mg total) by  mouth 3 (three) times daily., Disp: 200 tablet, Rfl: 3   Multiple Vitamin (MULTIVITAMIN WITH MINERALS) TABS tablet, Take 1 tablet by mouth daily., Disp: , Rfl:    Ondansetron HCl (ZOFRAN PO), 4 mg. Take 1 tablet on the tongue and allow to dissolve, Disp: , Rfl:    potassium chloride SA (KLOR-CON M) 20 MEQ tablet, TAKE 2 TABLETS THREE TIMES DAILY. TAKE AN EXTRA 2 TABLETS ON DAYS YOU TAKE METOLAZONE, Disp: 572 tablet, Rfl: 3   risperiDONE (RISPERDAL) 0.5 MG tablet, Take 1 tablet (0.5 mg total) by mouth 2 (two) times daily at 8 am and 4 pm., Disp: 60 tablet, Rfl: 1   simvastatin (ZOCOR) 10 MG tablet, TAKE 1 TABLET EVERY EVENING, Disp: 90 tablet, Rfl: 3   spironolactone (ALDACTONE) 25 MG tablet, TAKE 1 TABLET (25 MG TOTAL) BY MOUTH EVERY EVENING., Disp: 90 tablet, Rfl: 3   torsemide (DEMADEX) 20 MG tablet, Take 4 tablets (80 mg total) by mouth 2 (two) times daily., Disp: 240 tablet, Rfl: 6 Allergies  Allergen Reactions   Ceftin Anaphylaxis    Face and throat swell    Cefuroxime Axetil  Anaphylaxis    Face and throat swell   Geodon [Ziprasidone Hcl] Hives   Lisinopril Other (See Comments)    angioedema   Cefuroxime     Other reaction(s): anaphylaxis   Sulfacetamide Sodium-Sulfur     Other reaction(s): itch   Allopurinol Nausea Only and Other (See Comments)    weakness   Ativan [Lorazepam] Itching   Sulfa Antibiotics Itching   Valium [Diazepam] Other (See Comments)    Patient states that diazepam doesn't relax, it has the opposite effect.     Social History   Socioeconomic History   Marital status: Married    Spouse name: Not on file   Number of children: 2   Years of education: 57   Highest education level: Master's degree (e.g., MA, MS, MEng, MEd, MSW, MBA)  Occupational History   Occupation: retired  Tobacco Use   Smoking status: Some Days    Current packs/day: 0.00    Average packs/day: 0.1 packs/day for 26.0 years (2.6 ttl pk-yrs)    Types: Cigarettes    Start date: 03/24/1995    Last attempt to quit: 03/23/2021    Years since quitting: 1.7   Smokeless tobacco: Never  Vaping Use   Vaping status: Never Used  Substance and Sexual Activity   Alcohol use: No   Drug use: No   Sexual activity: Yes  Other Topics Concern   Not on file  Social History Narrative   Tobacco Use Cigarettes: Former Smoker, Quit in 2008   No Alcohol   No recreational drug use   Diet: Regular/Low Carb   Exercise: None   Occupation: disabled   Education: Company secretary, masters   Children: 2   Firearms: No   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      Social Determinants of Health   Financial Resource Strain: Not on file  Food Insecurity: No Food Insecurity (08/11/2022)   Hunger Vital Sign    Worried About Running Out of Food in the Last Year: Never true    Ran Out of Food in the Last Year: Never true  Transportation Needs: No Transportation Needs (08/11/2022)   PRAPARE - Administrator, Civil Service (Medical): No     Lack of Transportation (Non-Medical): No  Physical Activity: Not  on file  Stress: No Stress Concern Present (08/11/2022)   [name] of Occupational Health - Occupational Stress Questionnaire    Feeling of Stress : Only a little  Social Connections: Not on file  Intimate Partner Violence: Not At Risk (07/27/2022)   Humiliation, Afraid, Rape, and Kick questionnaire    Fear of Current or Ex-Partner: No    Emotionally Abused: No    Physically Abused: No    Sexually Abused: No    Physical Exam      Future Appointments  Date Time Provider Department Center  01/11/2023  7:35 AM [facility] DEVICE REMOTES [facility]  01/13/2023  1:45 PM [facility] LAB [facility] None  01/28/2023  4:20 PM Arfeen, Phillips Grout, MD [facility] None  02/03/2023 10:45 AM Freddie Breech, DPM [facility]  02/04/2023  1:00 PM Ashok Croon, MD [facility] None  03/15/2023  7:10 AM [facility] DEVICE REMOTES [facility]  06/02/2023  1:00 PM Rosann Auerbach, PhD [facility] None  06/02/2023  2:00 PM [facility] TE[facility] None  06/09/2023  2:30 PM Rosann Auerbach, PhD [facility] None  06/14/2023  7:10 AM [facility] DEVICE REMOTES [facility]  06/29/2023  1:30 PM Nita Sickle K, DO [facility] None  09/13/2023  7:10 AM [facility] DEVICE REMOTES [facility]

## 2023-01-11 ENCOUNTER — Telehealth (HOSPITAL_COMMUNITY): Payer: Self-pay

## 2023-01-11 ENCOUNTER — Ambulatory Visit: Payer: Medicare HMO | Attending: Internal Medicine

## 2023-01-11 DIAGNOSIS — Z95810 Presence of automatic (implantable) cardiac defibrillator: Secondary | ICD-10-CM

## 2023-01-11 DIAGNOSIS — I5022 Chronic systolic (congestive) heart failure: Secondary | ICD-10-CM

## 2023-01-11 NOTE — Telephone Encounter (Signed)
Medication for refill called into [facility]  -Spironolactone  [name], EMT-Paramedic [phone] 01/11/2023

## 2023-01-12 ENCOUNTER — Telehealth (HOSPITAL_COMMUNITY): Payer: Self-pay

## 2023-01-12 NOTE — Telephone Encounter (Signed)
Reminder sent to Narda Amber and Ed via text for tomorrows lab appointment. Ed confirmed she would be there. Paramedicine team to follow up if any med changes made.   [name], EMT-Paramedic [phone] 01/12/2023

## 2023-01-13 ENCOUNTER — Ambulatory Visit (HOSPITAL_COMMUNITY)
Admission: RE | Admit: 2023-01-13 | Discharge: 2023-01-13 | Disposition: A | Discharge status: Home / Self Care | Payer: Medicare HMO | Source: Ambulatory Visit | Attending: Internal Medicine | Admitting: Internal Medicine

## 2023-01-13 DIAGNOSIS — M79661 Pain in right lower leg: Secondary | ICD-10-CM | POA: Diagnosis not present

## 2023-01-13 DIAGNOSIS — I509 Heart failure, unspecified: Secondary | ICD-10-CM | POA: Diagnosis not present

## 2023-01-13 DIAGNOSIS — M79662 Pain in left lower leg: Secondary | ICD-10-CM | POA: Diagnosis not present

## 2023-01-13 DIAGNOSIS — I5022 Chronic systolic (congestive) heart failure: Secondary | ICD-10-CM | POA: Insufficient documentation

## 2023-01-13 DIAGNOSIS — L0390 Cellulitis, unspecified: Secondary | ICD-10-CM | POA: Diagnosis not present

## 2023-01-13 DIAGNOSIS — E119 Type 2 diabetes mellitus without complications: Secondary | ICD-10-CM | POA: Diagnosis not present

## 2023-01-13 LAB — BASIC METABOLIC PANEL
Anion gap: 13 (ref 5–15)
BUN: 28 mg/dL — ABNORMAL HIGH (ref 8–23)
CO2: 28 mmol/L (ref 22–32)
Calcium: 9.1 mg/dL (ref 8.9–10.3)
Chloride: 93 mmol/L — ABNORMAL LOW (ref 98–111)
Creatinine, Ser: 1.62 mg/dL — ABNORMAL HIGH (ref 0.44–1.00)
GFR, Estimated: 34 mL/min — ABNORMAL LOW (ref >60)
Glucose, Bld: 121 mg/dL — ABNORMAL HIGH (ref 70–99)
Potassium: 3.5 mmol/L (ref 3.5–5.1)
Sodium: 134 mmol/L — ABNORMAL LOW (ref 135–145)

## 2023-01-13 NOTE — Progress Notes (Signed)
EPIC Encounter for ICM Monitoring  Patient Name: Rhonda [name] is a 72 y.o. female Date: 01/13/2023 Primary Care Physican: Shon Hale, MD Primary Cardiologist: Bensimhon Electrophysiologist: Rae Roam Pacing: 99.2%   06/01/2022 Weight: 193 lbs  07/08/2022 Weight: 183 lbs 08/10/2022 Weight: 185 lbs 11/25/2022 Weight: 180 lbs 12/30/2022 Weight: 184 lbs          Spoke with husband and heart failure questions reviewed.  Transmission results reviewed.  Pt asymptomatic for fluid accumulation.  She has been missing some afternoon dosages of Torsemide.  Husband works in the evenings but does the best he can with reminding patient to take evening dosages.                Optivol thoracic impedance suggesting possible fluid accumulation starting 8/13 and trending back close to baseline after 8/19 Metolazone dosage.  Impedance also decreased from 7/16-8/7.   Prescribed:  Patient participating in paramedicine program to assist with meds. Torsemide 20 mg 4 tablets (80 mg total) by mouth twice a day (increased 8/7)  Potassium 20 meq 2 tablets (40 mEq total) by mouth 3 (three) times daily.  Take extra 2 tablets on days you take Metolazone.   Metolazone 2.5mg  1 tablet every other Tuesday.  (next dosage due 02/09/23) Spironolactone 25 mg take 1 tablet by mouth every evening.   Labs: 01/13/2023 BMET Scheduled at HF clinic 12/30/2022 Creatinine 1.64, BUN 31, Potassium 3.6, Sodium 134, GFR 33 12/02/2022 Creatinine 1.68 BUN 23,  Potassium 4.0, Sodium 136, GFR 33 08/01/2022 Creatinine 1.47, BUN 42, Potassium 3.1, Sodium 136, GFR 38  07/26/2022 Creatinine 2.02, BUN 25, Potassium 4.6, Sodium 133, GFR 26  07/22/2022 Creatinine 1.57, BUN 20, Potassium 3.8, Sodium 136, GFR 35  07/14/2022 Creatinine 1.32, BUN 20, Potassium 4.2, Sodium 138, GFR 44 06/25/2022 Creatinine 2.19, BUN 29, Potassium 3.5, Sodium 134, GFR 24 A complete set of results can be found in Results Review.   Recommendations:   No changes  and encouraged to call if patient is experiencing any fluid symptoms.   Follow-up plan: ICM clinic phone appointment on 02/10/2023.   91 day device clinic remote transmission 03/15/2023.     EP/Cardiology Office Visits:  Recall 05/29/2023 with Dr Gala Romney.  Recall 03/20/2023 with Dr Ladona Ridgel.     Copy of ICM check sent to Dr. Ladona Ridgel.   3 month ICM trend: 01/13/2023.    12-14 Month ICM trend:     Karie Soda, RN 01/13/2023 9:41 AM

## 2023-01-18 ENCOUNTER — Telehealth (HOSPITAL_COMMUNITY): Payer: Self-pay | Admitting: Emergency Medicine

## 2023-01-18 ENCOUNTER — Other Ambulatory Visit (HOSPITAL_COMMUNITY): Payer: Self-pay | Admitting: Emergency Medicine

## 2023-01-18 NOTE — Telephone Encounter (Signed)
Spoke with Mrs. Semper at our home visit today and noted that she had gained ~7lbs in the past 2x weeks. I did note that she had missed 5x evening doses in those 2x weeks as well. Pt is asymptomatic of same, but advises that she does feel full. Pt was noted to have fluid, but no pitting edema noted. Lung sounds clear and vitals within normal range. Spoke with pt about the importance of medication compliance and what each medication does. Pt seemed to understand but does suffer from memory problems. Will follow up with Husband in home to brainstorm other ways she can be successfully compliant.   Do you have any further advise/changes for this patient based on weight gain?  [name] Setting EMT-P Community Paramedic  [phone]

## 2023-01-18 NOTE — Progress Notes (Signed)
Paramedicine Encounter    Patient ID: Rhonda [name], female    DOB: 08/05/1950, 72 y.o.   MRN: [id number]   Complaints - "feeling full"  Assessment - Lung sounds clear, ambulatory during visit without exertional shortness of breath. Edema, but no pitting.  Compliance with meds - Missed 5x night time doses and 2x noon doses.   Pill box filled - for 1x week   Refills needed - Clonazepam and torsemide ( short this week, will need BID Fri- Mon)  Meds changes since last visit - Antibiotic added from PCP - will complete course on Wednesday     Social changes - none   VISIT SUMMARY**  Met with Rhonda [name] in the home today. Pt denied any complaints initially, denying dizziness, shortness of breath, or chest pain. Pt denied any shortness of breath upon exertion. Pt was noted to have missed 5x evening doses of her medication over the past 2x weeks. Pt advised that she keeps forgetting despite multiple efforts to aid in her memory. Pt was reeducated about her sodium intake and was informed of the importance of her compliance, husband advised as well. Will follow up with the clinic in the morining regarding weight gain. Pt's pill box was filled for 8 days. Pt was advised of upcoming appointments. Will follow up with pt in the AM regarding her weight and [facility] feedback, will follow up In the home in 8 days.     BP 120/70   Pulse 88   Resp 16   Wt 186 lb 11.2 oz (84.7 kg)   SpO2 97%   BMI 35.28 kg/m  Weight yesterday-DNW Last visit weight- 179.6 lb     ACTION: Home visit completed   [name] Setting EMT-P Community Paramedic  [phone]      Patient Care Team: Shon Hale, MD as PCP - General (Family Medicine) Burna Sis, LCSW as Social Worker (Licensed Clinical Social Worker) [name], Noberto Retort, DO as Consulting Physician (Neurology) [name], Paramedic as Paramedic Chanetta Marshall, Meridee Score, MD as Attending Physician (Family Medicine) Clinton Gallant, RN as  [facility] Care Management Bensimhon, Bevelyn Buckles, MD as Consulting Physician (Cardiology) Lolly Mustache, Phillips Grout, MD as Consulting Physician (Psychiatry) Candelaria Stagers, DPM as Consulting Physician (Podiatry)  Patient Active Problem List   Diagnosis Date Noted   Major depressive disorder, recurrent episode, severe (HCC) 07/27/2022   MDD (major depressive disorder), recurrent severe, without psychosis (HCC) 07/27/2022   MDD (major depressive disorder) 07/27/2022   Elevated troponin 12/02/2021   Nausea & vomiting 12/01/2021   Acute left ankle pain 10/11/2021   Intractable nausea and vomiting 10/08/2021   AKI (acute kidney injury) (HCC) 10/08/2021   Hyponatremia 10/08/2021   Decreased estrogen level 08/01/2021   Dementia (HCC) 08/01/2021   Frail elderly 08/01/2021   Hyperparathyroidism (HCC) 08/01/2021   Spondylolisthesis 08/01/2021   Varicose veins of bilateral lower extremities with other complications 08/01/2021   Lumbago without sciatica 04/30/2021   Abnormal gait 06/11/2020   Adult failure to thrive syndrome 06/11/2020   Allergic rhinitis 06/11/2020   Anxiety 06/11/2020   Asthma 06/11/2020   Benign intracranial hypertension 06/11/2020   Body mass index (BMI) 45.0-49.9, adult (HCC) 06/11/2020   Bowel incontinence 06/11/2020   Cholelithiasis 06/11/2020   Chronic sinusitis 06/11/2020   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   Daytime somnolence 06/11/2020   Diarrhea of presumed infectious origin 06/11/2020   Edema 06/11/2020   Family history of malignant neoplasm of gastrointestinal tract 06/11/2020   Gout 06/11/2020  History of fall 06/11/2020   History of infectious disease 06/11/2020   Insomnia 06/11/2020   Irregular bowel habits 06/11/2020   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   Lumbar spondylosis with myelopathy 06/11/2020   Macrocytosis 06/11/2020   Mild recurrent major depression (HCC) 06/11/2020   Personal history of colonic polyps 06/11/2020   Personal history of malignant  neoplasm of breast 06/11/2020   Repeated falls 06/11/2020   Mixed anxiety and depressive disorder 06/11/2020   Irritable bowel syndrome 01/04/2020   Spinal stenosis of lumbar region 01/03/2020   Other symptoms and signs involving the musculoskeletal system 09/11/2019   Bilateral leg weakness 08/01/2019   Leg swelling 08/01/2019   Diabetes mellitus with neuropathy (HCC) 06/15/2019   Hypokalemia 11/08/2018   CKD (chronic kidney disease) stage 3, GFR 30-59 ml/min (HCC) 11/08/2018   Orthostatic hypotension 07/28/2017   SVD (spontaneous vaginal delivery)    Peripheral neuropathy    On home oxygen therapy    Migraines    Left bundle branch block    Hypothyroidism    Hypertension    Hyperlipidemia    Heart murmur    GERD (gastroesophageal reflux disease)    Exertional shortness of breath    Major depressive disorder    Back pain    Arthritis    Generalized anxiety disorder    Anemia    Chronic respiratory failure (HCC) 09/14/2013   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   Morbid obesity (HCC) 11/01/2012   Chronic systolic heart failure (HCC) 10/27/2012   Endometrial polyp 01/20/2012   Malignant tumor of breast (HCC) 03/26/2011   Unspecified vitamin D deficiency 03/26/2011    Current Outpatient Medications:    acetaZOLAMIDE (DIAMOX) 125 MG tablet, Take 1 tablet (125 mg total) by mouth daily., Disp: 90 tablet, Rfl: 3   Ascorbic Acid (VITAMIN C) 1000 MG tablet, Take 1,000 mg by mouth daily., Disp: , Rfl:    buPROPion (WELLBUTRIN XL) 150 MG 24 hr tablet, Take 1 tablet (150 mg total) by mouth daily., Disp: 30 tablet, Rfl: 0   carvedilol (COREG) 3.125 MG tablet, TAKE 1 TABLET (3.125 MG) BY MOUTH TWICE DAILY WITH MEALS, Disp: 180 tablet, Rfl: 3   citalopram (CELEXA) 20 MG tablet, Take 1 tablet (20 mg total) by mouth daily., Disp: 30 tablet, Rfl: 1   clonazePAM (KLONOPIN) 0.5 MG tablet, Take 1 tablet (0.5 mg total) by mouth 2 (two) times daily.,  Disp: 60 tablet, Rfl: 1   cyanocobalamin (VITAMIN B12) 1000 MCG tablet, Take 1,000 mcg by mouth daily., Disp: , Rfl:    dicyclomine (BENTYL) 20 MG tablet, Take 20 mg by mouth 3 (three) times daily before meals., Disp: , Rfl:    fluticasone (FLONASE) 50 MCG/ACT nasal spray, Place 1 spray into both nostrils daily., Disp: , Rfl:    gabapentin (NEURONTIN) 100 MG capsule, Take 1 capsule (100 mg total) by mouth 2 (two) times daily., Disp: 180 capsule, Rfl: 3   levothyroxine (SYNTHROID) 88 MCG tablet, TAKE 1 TABLET(88 MCG) BY MOUTH DAILY BEFORE BREAKFAST, Disp: 90 tablet, Rfl: 3   memantine (NAMENDA) 5 MG tablet, Take 1 tablet (5 mg total) by mouth daily., Disp: 30 tablet, Rfl: 3   metolazone (ZAROXOLYN) 2.5 MG tablet, TAKE 1 TABLET BY MOUTH EVERY OTHER TUESDAY., Disp: 12 tablet, Rfl: 3   midodrine (PROAMATINE) 5 MG tablet, Take 1 tablet (5 mg total) by mouth 3 (three) times daily., Disp: 200 tablet, Rfl: 3   Multiple Vitamin (MULTIVITAMIN WITH MINERALS) TABS tablet, Take 1 tablet  by mouth daily., Disp: , Rfl:    Ondansetron HCl (ZOFRAN PO), 4 mg. Take 1 tablet on the tongue and allow to dissolve, Disp: , Rfl:    potassium chloride SA (KLOR-CON M) 20 MEQ tablet, TAKE 2 TABLETS THREE TIMES DAILY. TAKE AN EXTRA 2 TABLETS ON DAYS YOU TAKE METOLAZONE, Disp: 572 tablet, Rfl: 3   risperiDONE (RISPERDAL) 0.5 MG tablet, Take 1 tablet (0.5 mg total) by mouth 2 (two) times daily at 8 am and 4 pm., Disp: 60 tablet, Rfl: 1   simvastatin (ZOCOR) 10 MG tablet, TAKE 1 TABLET EVERY EVENING, Disp: 90 tablet, Rfl: 3   spironolactone (ALDACTONE) 25 MG tablet, TAKE 1 TABLET (25 MG TOTAL) BY MOUTH EVERY EVENING., Disp: 90 tablet, Rfl: 3   torsemide (DEMADEX) 20 MG tablet, Take 4 tablets (80 mg total) by mouth 2 (two) times daily., Disp: 240 tablet, Rfl: 6 Allergies  Allergen Reactions   Ceftin Anaphylaxis    Face and throat swell    Cefuroxime Axetil Anaphylaxis    Face and throat swell   Geodon [Ziprasidone Hcl] Hives    Lisinopril Other (See Comments)    angioedema   Cefuroxime     Other reaction(s): anaphylaxis   Sulfacetamide Sodium-Sulfur     Other reaction(s): itch   Allopurinol Nausea Only and Other (See Comments)    weakness   Ativan [Lorazepam] Itching   Sulfa Antibiotics Itching   Valium [Diazepam] Other (See Comments)    Patient states that diazepam doesn't relax, it has the opposite effect.     Social History   Socioeconomic History   Marital status: Married    Spouse name: Not on file   Number of children: 2   Years of education: 13   Highest education level: Master's degree (e.g., MA, MS, MEng, MEd, MSW, MBA)  Occupational History   Occupation: retired  Tobacco Use   Smoking status: Some Days    Current packs/day: 0.00    Average packs/day: 0.1 packs/day for 26.0 years (2.6 ttl pk-yrs)    Types: Cigarettes    Start date: 03/24/1995    Last attempt to quit: 03/23/2021    Years since quitting: 1.8   Smokeless tobacco: Never  Vaping Use   Vaping status: Never Used  Substance and Sexual Activity   Alcohol use: No   Drug use: No   Sexual activity: Yes  Other Topics Concern   Not on file  Social History Narrative   Tobacco Use Cigarettes: Former Smoker, Quit in 2008   No Alcohol   No recreational drug use   Diet: Regular/Low Carb   Exercise: None   Occupation: disabled   Education: Company secretary, masters   Children: 2   Firearms: No   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      Social Determinants of Health   Financial Resource Strain: Not on file  Food Insecurity: No Food Insecurity (08/11/2022)   Hunger Vital Sign    Worried About Running Out of Food in the Last Year: Never true    Ran Out of Food in the Last Year: Never true  Transportation Needs: No Transportation Needs (08/11/2022)   PRAPARE - Administrator, Civil Service (Medical): No    Lack of Transportation (Non-Medical): No  Physical Activity: Not on  file  Stress: No Stress Concern Present (08/11/2022)   [name] of Occupational Health - Occupational Stress Questionnaire  Feeling of Stress : Only a little  Social Connections: Not on file  Intimate Partner Violence: Not At Risk (07/27/2022)   Humiliation, Afraid, Rape, and Kick questionnaire    Fear of Current or Ex-Partner: No    Emotionally Abused: No    Physically Abused: No    Sexually Abused: No    Physical Exam      Future Appointments  Date Time Provider Department Center  01/28/2023  4:20 PM Arfeen, Phillips Grout, MD [facility] None  02/03/2023 10:45 AM Freddie Breech, DPM [facility]  02/04/2023  1:00 PM Ashok Croon, MD [facility] None  02/10/2023  7:00 AM [facility] DEVICE REMOTES [facility]  03/15/2023  7:10 AM [facility] DEVICE REMOTES [facility]  06/02/2023  1:00 PM Rosann Auerbach, PhD [facility] None  06/02/2023  2:00 PM [facility] TECH [facility] None  06/09/2023  2:30 PM Rosann Auerbach, PhD [facility] None  06/14/2023  7:10 AM [facility] DEVICE REMOTES [facility]  06/29/2023  1:30 PM Nita Sickle K, DO [facility] None  09/13/2023  7:10 AM [facility] DEVICE REMOTES [facility]

## 2023-01-19 ENCOUNTER — Telehealth (HOSPITAL_COMMUNITY): Payer: Self-pay

## 2023-01-19 ENCOUNTER — Other Ambulatory Visit (HOSPITAL_COMMUNITY): Payer: Self-pay | Admitting: Emergency Medicine

## 2023-01-19 ENCOUNTER — Other Ambulatory Visit (HOSPITAL_COMMUNITY): Payer: Self-pay | Admitting: Psychiatry

## 2023-01-19 DIAGNOSIS — F411 Generalized anxiety disorder: Secondary | ICD-10-CM

## 2023-01-19 DIAGNOSIS — F330 Major depressive disorder, recurrent, mild: Secondary | ICD-10-CM

## 2023-01-19 NOTE — Telephone Encounter (Signed)
Refills called into [facility]-  Torsemide Clonazepam   Husband will pick up.   [name], EMT-Paramedic [phone] 01/19/2023

## 2023-01-19 NOTE — Telephone Encounter (Signed)
Sarah with para medicine aware

## 2023-01-19 NOTE — Progress Notes (Signed)
Due to weight gain noted yesterday, Prince Rome, NP advised to add 1x 2.5mg  metolozone and 40 mEq of potassium today.  Pt was weighed again today as noted with no change noted from yesterday.   1x dose taken while in the home due to pt's memory.  Will follow up with pt.   [name] Setting EMT-P Community Paramedic  [phone]

## 2023-01-20 ENCOUNTER — Telehealth (HOSPITAL_COMMUNITY): Payer: Self-pay | Admitting: Emergency Medicine

## 2023-01-20 NOTE — Telephone Encounter (Signed)
Spoke with Narda Amber on the phone today. Pt denied any symptoms or adverse reactions to the medication. Pt was asked to weigh on the scale during our phone call and pt weighed 185.4 lbs.   Pt was again encouraged to keep a chart of her weights every day and was reminded of the parameters of concern (3lbs over night or 5 lbs in a week)  Will follow up with Narda Amber next week at our home visit.   [name] Setting EMT-P Community Paramedic  [phone]

## 2023-01-21 ENCOUNTER — Institutional Professional Consult (permissible substitution) (INDEPENDENT_AMBULATORY_CARE_PROVIDER_SITE_OTHER): Payer: Medicare HMO | Admitting: Otolaryngology

## 2023-01-26 ENCOUNTER — Other Ambulatory Visit (HOSPITAL_COMMUNITY): Payer: Self-pay | Admitting: Emergency Medicine

## 2023-01-26 NOTE — Progress Notes (Signed)
Paramedicine Encounter    Patient ID: Rhonda [name], female    DOB: [birth year], 72 y.o.   MRN: [id number]   Complaints - none   Assessment - lung sounds clear, ambulatory without shortness of breath, speaking in full and complete sentences, no edema present   Compliance with meds - missed 2x noon doses   Pill box filled - for 1x week    Refills needed - citalopram, clonazepam, dicyclomine, Risperidone  Meds changes since last visit - none     Social changes - none    VISIT SUMMARY**  Met in the home with Rhonda [name]. Pt was ambulatory to the door without distress. Pt denied any complaints, but said she had a late rise this morning. Pt denied any increased shortness of breath, dizziness, or chest pains.  Pt's compliance has increased from last week. Pt was assessed as noted. Pt's pill box was filled for 1x week. Pt was made aware of upcoming neurologist appointment and reminder text was sent to her husband as well. Will follow up in the home in 1 week.  BP 128/80   Pulse 86   Resp 16   Wt 185 lb 6.4 oz (84.1 kg)   SpO2 96%   BMI 35.03 kg/m  Weight yesterday-DNW Last visit weight-186lbs  [name] Setting EMT-P Community Paramedic  [phone]     ACTION: Home visit completed     Patient Care Team: Shon Hale, MD as PCP - General (Family Medicine) Burna Sis, LCSW as Social Worker (Licensed Clinical Social Worker) [name], Noberto Retort, DO as Consulting Physician (Neurology) [name], Paramedic as Paramedic Chanetta Marshall, Meridee Score, MD as Attending Physician (Family Medicine) Clinton Gallant, RN as [facility] Care Management Bensimhon, Bevelyn Buckles, MD as Consulting Physician (Cardiology) Lolly Mustache Phillips Grout, MD as Consulting Physician (Psychiatry) Candelaria Stagers, DPM as Consulting Physician (Podiatry)  Patient Active Problem List   Diagnosis Date Noted   Major depressive disorder, recurrent episode, severe (HCC) 07/27/2022   MDD (major  depressive disorder), recurrent severe, without psychosis (HCC) 07/27/2022   MDD (major depressive disorder) 07/27/2022   Elevated troponin 12/02/2021   Nausea & vomiting 12/01/2021   Acute left ankle pain 10/11/2021   Intractable nausea and vomiting 10/08/2021   AKI (acute kidney injury) (HCC) 10/08/2021   Hyponatremia 10/08/2021   Decreased estrogen level 08/01/2021   Dementia (HCC) 08/01/2021   Frail elderly 08/01/2021   Hyperparathyroidism (HCC) 08/01/2021   Spondylolisthesis 08/01/2021   Varicose veins of bilateral lower extremities with other complications 08/01/2021   Lumbago without sciatica 04/30/2021   Abnormal gait 06/11/2020   Adult failure to thrive syndrome 06/11/2020   Allergic rhinitis 06/11/2020   Anxiety 06/11/2020   Asthma 06/11/2020   Benign intracranial hypertension 06/11/2020   Body mass index (BMI) 45.0-49.9, adult (HCC) 06/11/2020   Bowel incontinence 06/11/2020   Cholelithiasis 06/11/2020   Chronic sinusitis 06/11/2020   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   Daytime somnolence 06/11/2020   Diarrhea of presumed infectious origin 06/11/2020   Edema 06/11/2020   Family history of malignant neoplasm of gastrointestinal tract 06/11/2020   Gout 06/11/2020   History of fall 06/11/2020   History of infectious disease 06/11/2020   Insomnia 06/11/2020   Irregular bowel habits 06/11/2020   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   Lumbar spondylosis with myelopathy 06/11/2020   Macrocytosis 06/11/2020   Mild recurrent major depression (HCC) 06/11/2020   Personal history of colonic polyps 06/11/2020   Personal history of malignant neoplasm of breast 06/11/2020  Repeated falls 06/11/2020   Mixed anxiety and depressive disorder 06/11/2020   Irritable bowel syndrome 01/04/2020   Spinal stenosis of lumbar region 01/03/2020   Other symptoms and signs involving the musculoskeletal system 09/11/2019   Bilateral leg weakness 08/01/2019   Leg swelling 08/01/2019   Diabetes mellitus  with neuropathy (HCC) 06/15/2019   Hypokalemia 11/08/2018   CKD (chronic kidney disease) stage 3, GFR 30-59 ml/min (HCC) 11/08/2018   Orthostatic hypotension 07/28/2017   SVD (spontaneous vaginal delivery)    Peripheral neuropathy    On home oxygen therapy    Migraines    Left bundle branch block    Hypothyroidism    Hypertension    Hyperlipidemia    Heart murmur    GERD (gastroesophageal reflux disease)    Exertional shortness of breath    Major depressive disorder    Back pain    Arthritis    Generalized anxiety disorder    Anemia    Chronic respiratory failure (HCC) 09/14/2013   Biventricular ICD (implantable cardioverter-defibrillator) in place 08/04/2013   Morbid obesity (HCC) 11/01/2012   Chronic systolic heart failure (HCC) 10/27/2012   Endometrial polyp 01/20/2012   Malignant tumor of breast (HCC) 03/26/2011   Unspecified vitamin D deficiency 03/26/2011    Current Outpatient Medications:    acetaZOLAMIDE (DIAMOX) 125 MG tablet, Take 1 tablet (125 mg total) by mouth daily., Disp: 90 tablet, Rfl: 3   Ascorbic Acid (VITAMIN C) 1000 MG tablet, Take 1,000 mg by mouth daily., Disp: , Rfl:    buPROPion (WELLBUTRIN XL) 150 MG 24 hr tablet, Take 1 tablet (150 mg total) by mouth daily., Disp: 30 tablet, Rfl: 0   carvedilol (COREG) 3.125 MG tablet, TAKE 1 TABLET (3.125 MG) BY MOUTH TWICE DAILY WITH MEALS, Disp: 180 tablet, Rfl: 3   citalopram (CELEXA) 20 MG tablet, Take 1 tablet (20 mg total) by mouth daily., Disp: 30 tablet, Rfl: 1   clonazePAM (KLONOPIN) 0.5 MG tablet, Take 1 tablet (0.5 mg total) by mouth 2 (two) times daily., Disp: 60 tablet, Rfl: 1   cyanocobalamin (VITAMIN B12) 1000 MCG tablet, Take 1,000 mcg by mouth daily., Disp: , Rfl:    dicyclomine (BENTYL) 20 MG tablet, Take 20 mg by mouth 3 (three) times daily before meals., Disp: , Rfl:    fluticasone (FLONASE) 50 MCG/ACT nasal spray, Place 1 spray into both nostrils daily., Disp: , Rfl:    gabapentin (NEURONTIN) 100  MG capsule, Take 1 capsule (100 mg total) by mouth 2 (two) times daily., Disp: 180 capsule, Rfl: 3   levothyroxine (SYNTHROID) 88 MCG tablet, TAKE 1 TABLET(88 MCG) BY MOUTH DAILY BEFORE BREAKFAST, Disp: 90 tablet, Rfl: 3   memantine (NAMENDA) 5 MG tablet, Take 1 tablet (5 mg total) by mouth daily., Disp: 30 tablet, Rfl: 3   metolazone (ZAROXOLYN) 2.5 MG tablet, TAKE 1 TABLET BY MOUTH EVERY OTHER TUESDAY., Disp: 12 tablet, Rfl: 3   midodrine (PROAMATINE) 5 MG tablet, Take 1 tablet (5 mg total) by mouth 3 (three) times daily., Disp: 200 tablet, Rfl: 3   Multiple Vitamin (MULTIVITAMIN WITH MINERALS) TABS tablet, Take 1 tablet by mouth daily., Disp: , Rfl:    Ondansetron HCl (ZOFRAN PO), 4 mg. Take 1 tablet on the tongue and allow to dissolve, Disp: , Rfl:    potassium chloride SA (KLOR-CON M) 20 MEQ tablet, TAKE 2 TABLETS THREE TIMES DAILY. TAKE AN EXTRA 2 TABLETS ON DAYS YOU TAKE METOLAZONE, Disp: 572 tablet, Rfl: 3   risperiDONE (RISPERDAL) 0.5  MG tablet, Take 1 tablet (0.5 mg total) by mouth 2 (two) times daily at 8 am and 4 pm., Disp: 60 tablet, Rfl: 1   simvastatin (ZOCOR) 10 MG tablet, TAKE 1 TABLET EVERY EVENING, Disp: 90 tablet, Rfl: 3   spironolactone (ALDACTONE) 25 MG tablet, TAKE 1 TABLET (25 MG TOTAL) BY MOUTH EVERY EVENING., Disp: 90 tablet, Rfl: 3   torsemide (DEMADEX) 20 MG tablet, Take 4 tablets (80 mg total) by mouth 2 (two) times daily., Disp: 240 tablet, Rfl: 6 Allergies  Allergen Reactions   Ceftin Anaphylaxis    Face and throat swell    Cefuroxime Axetil Anaphylaxis    Face and throat swell   Geodon [Ziprasidone Hcl] Hives   Lisinopril Other (See Comments)    angioedema   Cefuroxime     Other reaction(s): anaphylaxis   Sulfacetamide Sodium-Sulfur     Other reaction(s): itch   Allopurinol Nausea Only and Other (See Comments)    weakness   Ativan [Lorazepam] Itching   Sulfa Antibiotics Itching   Valium [Diazepam] Other (See Comments)    Patient states that diazepam  doesn't relax, it has the opposite effect.     Social History   Socioeconomic History   Marital status: Married    Spouse name: Not on file   Number of children: 2   Years of education: 27   Highest education level: Master's degree (e.g., MA, MS, MEng, MEd, MSW, MBA)  Occupational History   Occupation: retired  Tobacco Use   Smoking status: Some Days    Current packs/day: 0.00    Average packs/day: 0.1 packs/day for 26.0 years (2.6 ttl pk-yrs)    Types: Cigarettes    Start date: 03/24/1995    Last attempt to quit: 03/23/2021    Years since quitting: 1.8   Smokeless tobacco: Never  Vaping Use   Vaping status: Never Used  Substance and Sexual Activity   Alcohol use: No   Drug use: No   Sexual activity: Yes  Other Topics Concern   Not on file  Social History Narrative   Tobacco Use Cigarettes: Former Smoker, Quit in 2008   No Alcohol   No recreational drug use   Diet: Regular/Low Carb   Exercise: None   Occupation: disabled   Education: Company secretary, masters   Children: 2   Firearms: No   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      Social Determinants of Health   Financial Resource Strain: Not on file  Food Insecurity: No Food Insecurity (08/11/2022)   Hunger Vital Sign    Worried About Running Out of Food in the Last Year: Never true    Ran Out of Food in the Last Year: Never true  Transportation Needs: No Transportation Needs (08/11/2022)   PRAPARE - Administrator, Civil Service (Medical): No    Lack of Transportation (Non-Medical): No  Physical Activity: Not on file  Stress: No Stress Concern Present (08/11/2022)   [name] of Occupational Health - Occupational Stress Questionnaire    Feeling of Stress : Only a little  Social Connections: Not on file  Intimate Partner Violence: Not At Risk (07/27/2022)   Humiliation, Afraid, Rape, and Kick questionnaire    Fear of Current or Ex-Partner: No     Emotionally Abused: No    Physically Abused: No    Sexually Abused: No    Physical Exam  Future Appointments  Date Time Provider Department Center  01/28/2023  8:30 AM Rosann Auerbach, PhD [facility] None  01/28/2023  9:30 AM [facility] NEUROPSYCH TECH [facility] None  02/03/2023 10:45 AM Freddie Breech, DPM [facility]  02/04/2023  1:00 PM Ashok Croon, MD [facility] None  02/10/2023  7:00 AM [facility] DEVICE REMOTES [facility]  03/04/2023  3:40 PM Arfeen, Phillips Grout, MD [facility] None  03/15/2023  7:10 AM [facility] DEVICE REMOTES [facility]  06/14/2023  7:10 AM [facility] DEVICE REMOTES [facility]  06/29/2023  1:30 PM Nita Sickle K, DO [facility] None  09/13/2023  7:10 AM [facility] DEVICE REMOTES [facility]

## 2023-01-27 ENCOUNTER — Other Ambulatory Visit (HOSPITAL_COMMUNITY): Payer: Self-pay | Admitting: Psychiatry

## 2023-01-27 ENCOUNTER — Telehealth (HOSPITAL_COMMUNITY): Payer: Self-pay

## 2023-01-27 DIAGNOSIS — F411 Generalized anxiety disorder: Secondary | ICD-10-CM

## 2023-01-27 DIAGNOSIS — F330 Major depressive disorder, recurrent, mild: Secondary | ICD-10-CM

## 2023-01-27 NOTE — Telephone Encounter (Signed)
Called in the following refills to [facility] for Rhonda [name]-   Citalopram Clonazepam  Dicyclomine Risperidone   All needing refills from the providers- [name] at [facility] reports he faxed the doctors yesterday on same and waiting on new RX. I will forward to [name] to make her aware of same.   [name], EMT-Paramedic [phone] 01/27/2023

## 2023-01-28 ENCOUNTER — Ambulatory Visit (HOSPITAL_COMMUNITY): Payer: Medicare HMO | Admitting: Psychiatry

## 2023-01-28 ENCOUNTER — Ambulatory Visit: Payer: Medicare HMO

## 2023-01-28 ENCOUNTER — Institutional Professional Consult (permissible substitution): Payer: Medicare HMO | Admitting: Psychology

## 2023-02-01 ENCOUNTER — Other Ambulatory Visit (HOSPITAL_COMMUNITY): Payer: Self-pay | Admitting: Emergency Medicine

## 2023-02-01 NOTE — Progress Notes (Signed)
Paramedicine Encounter    Patient ID: Rhonda [name], female    DOB: [birth year], 72 y.o.   MRN: [id number]   Complaints - no complaints heart related complaints, sore throat   Assessment - no edema noted, Lung sounds clear.   Compliance with meds - no missing doses noted.   Pill box filled - for 1x week, no clonazepam  Refills needed - respiradone, clonazepam, citalopram  Meds changes since last visit - none    Social changes - none   VISIT SUMMARY**  Met in the home with Rhonda [name]. Rhonda [name] reported moderate gradual weight gain. Pt reported that she hasn't been eating well and needs to go on a diet. In the home I noted an open bag of chips and an open take out container of asian cuisine. Pt was educated on sodium intake and the effect it can have on one's weight with heart failure. Pt was understanding and advised that she would do better. Pt denied shortness of breath, dizziness, or chest pain. Pt had no pedal edema noted and lung sounds clear. Pt's pill box was filled for 1x week. Missing Clonazepam,citalopram, and risperidone. Note left with husband with instructions on how to fill once medications were retrieved from pharmacy. Advised to call if he needed further assistance. Pt's upcoming appointments were reviewed. Will follow up in the home in 1x week.   BP 116/70   Pulse 74   Resp 16   Wt 187 lb 6.4 oz (85 kg)   SpO2 98%   BMI 35.41 kg/m  Weight yesterday-DNW Last visit weight-185 lbs  [name] [facility] EMT-P Community Paramedic  [phone]     ACTION: Home visit completed     Patient Care Team: Shon Hale, MD as PCP - General (Family Medicine) Burna Sis, LCSW as Social Worker (Licensed Clinical Social Worker) [name], Noberto Retort, DO as Consulting Physician (Neurology) [name], Paramedic as Paramedic Chanetta Marshall, Meridee Score, MD as Attending Physician (Family Medicine) Clinton Gallant, RN as [facility] Care  Management Bensimhon, Bevelyn Buckles, MD as Consulting Physician (Cardiology) Lolly Mustache, Phillips Grout, MD as Consulting Physician (Psychiatry) Candelaria Stagers, DPM as Consulting Physician (Podiatry)  Patient Active Problem List   Diagnosis Date Noted   Major depressive disorder, recurrent episode, severe (HCC) 07/27/2022   MDD (major depressive disorder), recurrent severe, without psychosis (HCC) 07/27/2022   MDD (major depressive disorder) 07/27/2022   Elevated troponin 12/02/2021   Nausea & vomiting 12/01/2021   Acute left ankle pain 10/11/2021   Intractable nausea and vomiting 10/08/2021   AKI (acute kidney injury) (HCC) 10/08/2021   Hyponatremia 10/08/2021   Decreased estrogen level 08/01/2021   Dementia (HCC) 08/01/2021   Frail elderly 08/01/2021   Hyperparathyroidism (HCC) 08/01/2021   Spondylolisthesis 08/01/2021   Varicose veins of bilateral lower extremities with other complications 08/01/2021   Lumbago without sciatica 04/30/2021   Abnormal gait 06/11/2020   Adult failure to thrive syndrome 06/11/2020   Allergic rhinitis 06/11/2020   Anxiety 06/11/2020   Asthma 06/11/2020   Benign intracranial hypertension 06/11/2020   Body mass index (BMI) 45.0-49.9, adult (HCC) 06/11/2020   Bowel incontinence 06/11/2020   Cholelithiasis 06/11/2020   Chronic sinusitis 06/11/2020   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   Daytime somnolence 06/11/2020   Diarrhea of presumed infectious origin 06/11/2020   Edema 06/11/2020   Family history of malignant neoplasm of gastrointestinal tract 06/11/2020   Gout 06/11/2020   History of fall 06/11/2020   History of infectious disease 06/11/2020  Insomnia 06/11/2020   Irregular bowel habits 06/11/2020   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   Lumbar spondylosis with myelopathy 06/11/2020   Macrocytosis 06/11/2020   Mild recurrent major depression (HCC) 06/11/2020   Personal history of colonic polyps 06/11/2020   Personal history of malignant neoplasm of breast 06/11/2020    Repeated falls 06/11/2020   Mixed anxiety and depressive disorder 06/11/2020   Irritable bowel syndrome 01/04/2020   Spinal stenosis of lumbar region 01/03/2020   Other symptoms and signs involving the musculoskeletal system 09/11/2019   Bilateral leg weakness 08/01/2019   Leg swelling 08/01/2019   Diabetes mellitus with neuropathy (HCC) 06/15/2019   Hypokalemia 11/08/2018   CKD (chronic kidney disease) stage 3, GFR 30-59 ml/min (HCC) 11/08/2018   Orthostatic hypotension 07/28/2017   SVD (spontaneous vaginal delivery)    Peripheral neuropathy    On home oxygen therapy    Migraines    Left bundle branch block    Hypothyroidism    Hypertension    Hyperlipidemia    Heart murmur    GERD (gastroesophageal reflux disease)    Exertional shortness of breath    Major depressive disorder    Back pain    Arthritis    Generalized anxiety disorder    Anemia    Chronic respiratory failure (HCC) 09/14/2013   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/04/2013   Morbid obesity (HCC) 11/01/2012   Chronic systolic heart failure (HCC) 10/27/2012   Endometrial polyp 01/20/2012   Malignant tumor of breast (HCC) 03/26/2011   Unspecified vitamin D deficiency 03/26/2011    Current Outpatient Medications:    acetaZOLAMIDE (DIAMOX) 125 MG tablet, Take 1 tablet (125 mg total) by mouth daily., Disp: 90 tablet, Rfl: 3   Ascorbic Acid (VITAMIN C) 1000 MG tablet, Take 1,000 mg by mouth daily., Disp: , Rfl:    buPROPion (WELLBUTRIN XL) 150 MG 24 hr tablet, Take 1 tablet (150 mg total) by mouth daily., Disp: 30 tablet, Rfl: 0   carvedilol (COREG) 3.125 MG tablet, TAKE 1 TABLET (3.125 MG) BY MOUTH TWICE DAILY WITH MEALS, Disp: 180 tablet, Rfl: 3   citalopram (CELEXA) 20 MG tablet, Take 1 tablet (20 mg total) by mouth daily., Disp: 30 tablet, Rfl: 1   clonazePAM (KLONOPIN) 0.5 MG tablet, Take 1 tablet (0.5 mg total) by mouth 2 (two) times daily., Disp: 60 tablet, Rfl: 1    cyanocobalamin (VITAMIN B12) 1000 MCG tablet, Take 1,000 mcg by mouth daily., Disp: , Rfl:    dicyclomine (BENTYL) 20 MG tablet, Take 20 mg by mouth 3 (three) times daily before meals., Disp: , Rfl:    fluticasone (FLONASE) 50 MCG/ACT nasal spray, Place 1 spray into both nostrils daily., Disp: , Rfl:    gabapentin (NEURONTIN) 100 MG capsule, Take 1 capsule (100 mg total) by mouth 2 (two) times daily., Disp: 180 capsule, Rfl: 3   levothyroxine (SYNTHROID) 88 MCG tablet, TAKE 1 TABLET(88 MCG) BY MOUTH DAILY BEFORE BREAKFAST, Disp: 90 tablet, Rfl: 3   memantine (NAMENDA) 5 MG tablet, Take 1 tablet (5 mg total) by mouth daily., Disp: 30 tablet, Rfl: 3   metolazone (ZAROXOLYN) 2.5 MG tablet, TAKE 1 TABLET BY MOUTH EVERY OTHER TUESDAY., Disp: 12 tablet, Rfl: 3   midodrine (PROAMATINE) 5 MG tablet, Take 1 tablet (5 mg total) by mouth 3 (three) times daily., Disp: 200 tablet, Rfl: 3   Multiple Vitamin (MULTIVITAMIN WITH MINERALS) TABS tablet, Take 1 tablet by mouth daily., Disp: , Rfl:    Ondansetron HCl (ZOFRAN PO),  4 mg. Take 1 tablet on the tongue and allow to dissolve, Disp: , Rfl:    potassium chloride SA (KLOR-CON M) 20 MEQ tablet, TAKE 2 TABLETS THREE TIMES DAILY. TAKE AN EXTRA 2 TABLETS ON DAYS YOU TAKE METOLAZONE, Disp: 572 tablet, Rfl: 3   risperiDONE (RISPERDAL) 0.5 MG tablet, Take 1 tablet (0.5 mg total) by mouth 2 (two) times daily at 8 am and 4 pm., Disp: 60 tablet, Rfl: 1   simvastatin (ZOCOR) 10 MG tablet, TAKE 1 TABLET EVERY EVENING, Disp: 90 tablet, Rfl: 3   spironolactone (ALDACTONE) 25 MG tablet, TAKE 1 TABLET (25 MG TOTAL) BY MOUTH EVERY EVENING., Disp: 90 tablet, Rfl: 3   torsemide (DEMADEX) 20 MG tablet, Take 4 tablets (80 mg total) by mouth 2 (two) times daily., Disp: 240 tablet, Rfl: 6 Allergies  Allergen Reactions   Ceftin Anaphylaxis    Face and throat swell    Cefuroxime Axetil Anaphylaxis    Face and throat swell   Geodon [Ziprasidone Hcl] Hives   Lisinopril Other (See  Comments)    angioedema   Cefuroxime     Other reaction(s): anaphylaxis   Sulfacetamide Sodium-Sulfur     Other reaction(s): itch   Allopurinol Nausea Only and Other (See Comments)    weakness   Ativan [Lorazepam] Itching   Sulfa Antibiotics Itching   Valium [Diazepam] Other (See Comments)    Patient states that diazepam doesn't relax, it has the opposite effect.     Social History   Socioeconomic History   Marital status: Married    Spouse name: Not on file   Number of children: 2   Years of education: 9   Highest education level: Master's degree (e.g., MA, MS, MEng, MEd, MSW, MBA)  Occupational History   Occupation: retired  Tobacco Use   Smoking status: Some Days    Current packs/day: 0.00    Average packs/day: 0.1 packs/day for 26.0 years (2.6 ttl pk-yrs)    Types: Cigarettes    Start date: 03/24/1995    Last attempt to quit: 03/23/2021    Years since quitting: 1.8   Smokeless tobacco: Never  Vaping Use   Vaping status: Never Used  Substance and Sexual Activity   Alcohol use: No   Drug use: No   Sexual activity: Yes  Other Topics Concern   Not on file  Social History Narrative   Tobacco Use Cigarettes: Former Smoker, Quit in 2008   No Alcohol   No recreational drug use   Diet: Regular/Low Carb   Exercise: None   Occupation: disabled   Education: Company secretary, masters   Children: 2   Firearms: No   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      Social Determinants of Health   Financial Resource Strain: Not on file  Food Insecurity: No Food Insecurity (08/11/2022)   Hunger Vital Sign    Worried About Running Out of Food in the Last Year: Never true    Ran Out of Food in the Last Year: Never true  Transportation Needs: No Transportation Needs (08/11/2022)   PRAPARE - Administrator, Civil Service (Medical): No    Lack of Transportation (Non-Medical): No  Physical Activity: Not on file  Stress: No  Stress Concern Present (08/11/2022)   [name] of Occupational Health - Occupational Stress Questionnaire    Feeling of Stress : Only a little  Social Connections: Not on  file  Intimate Partner Violence: Not At Risk (07/27/2022)   Humiliation, Afraid, Rape, and Kick questionnaire    Fear of Current or Ex-Partner: No    Emotionally Abused: No    Physically Abused: No    Sexually Abused: No    Physical Exam      Future Appointments  Date Time Provider Department Center  02/03/2023 10:45 AM Freddie Breech, DPM [facility]  02/04/2023  1:00 PM Ashok Croon, MD [facility] None  02/10/2023  7:00 AM [facility] DEVICE REMOTES [facility]  03/04/2023  3:40 PM Arfeen, Phillips Grout, MD [facility] None  03/15/2023  7:10 AM [facility] DEVICE REMOTES [facility]  06/14/2023  7:10 AM [facility] DEVICE REMOTES [facility]  06/29/2023  1:30 PM Nita Sickle K, DO [facility] None  09/13/2023  7:10 AM [facility] DEVICE REMOTES [facility]  12/22/2023  8:30 AM Rosann Auerbach, PhD [facility] None  12/22/2023  9:30 AM [facility] None  12/30/2023 10:00 AM Rosann Auerbach, PhD [facility] None

## 2023-02-02 ENCOUNTER — Telehealth (HOSPITAL_COMMUNITY): Payer: Self-pay

## 2023-02-02 DIAGNOSIS — F330 Major depressive disorder, recurrent, mild: Secondary | ICD-10-CM

## 2023-02-02 DIAGNOSIS — R4189 Other symptoms and signs involving cognitive functions and awareness: Secondary | ICD-10-CM

## 2023-02-02 DIAGNOSIS — F411 Generalized anxiety disorder: Secondary | ICD-10-CM

## 2023-02-02 MED ORDER — CLONAZEPAM 0.5 MG PO TABS: 0.5000 mg | ORAL_TABLET | Freq: Two times a day (BID) | ORAL | 0 refills | Status: DC

## 2023-02-02 MED ORDER — RISPERIDONE 0.5 MG PO TABS: 0.5000 mg | ORAL_TABLET | ORAL | 0 refills | Status: DC

## 2023-02-02 MED ORDER — CITALOPRAM HYDROBROMIDE 20 MG PO TABS: 20.0000 mg | ORAL_TABLET | Freq: Every day | ORAL | 0 refills | Status: DC

## 2023-02-02 MED ORDER — BUPROPION HCL ER (XL) 150 MG PO TB24: 150.0000 mg | ORAL_TABLET | Freq: Every day | ORAL | 0 refills | Status: DC

## 2023-02-02 NOTE — Telephone Encounter (Signed)
[facility] needing refills for Mrs. [facility] sent in please.   Request forwarded to Neurology.   [name], EMT-Paramedic [phone] 02/02/2023

## 2023-02-02 NOTE — Telephone Encounter (Signed)
[facility] reached out needing refills sent in for all of Rhonda [name]'s behavioral health medications including the following:  -Wellbutrin -Celexa -Clonazepam -Risperidone -Namenda    Primary Care would prefer for [facility] to sign off on same. Patient is currently out of Risperidone, Clonazepam, Celexa in the home.   Forwarded to [facility] provider for follow up on refills.   [name], EMT-Paramedic [phone] 02/02/2023

## 2023-02-02 NOTE — Telephone Encounter (Signed)
I sent a 30-day prescription to [facility].  These are Wellbutrin, Klonopin, Celexa and risperidone.  She need to contact neurology or see neurology for Namenda.

## 2023-02-02 NOTE — Addendum Note (Signed)
Addended by: Kathryne Sharper T on: 02/02/2023 11:20 AM   Modules accepted: Orders

## 2023-02-03 ENCOUNTER — Ambulatory Visit: Payer: Medicare HMO | Admitting: Podiatry

## 2023-02-04 ENCOUNTER — Encounter (INDEPENDENT_AMBULATORY_CARE_PROVIDER_SITE_OTHER): Payer: Self-pay | Admitting: Otolaryngology

## 2023-02-04 ENCOUNTER — Ambulatory Visit (INDEPENDENT_AMBULATORY_CARE_PROVIDER_SITE_OTHER): Payer: Medicare HMO | Admitting: Otolaryngology

## 2023-02-04 VITALS — BP 108/70 | HR 75 | Ht 61.0 in | Wt 189.8 lb

## 2023-02-04 DIAGNOSIS — Q379 Unspecified cleft palate with unilateral cleft lip: Secondary | ICD-10-CM

## 2023-02-04 DIAGNOSIS — K1379 Other lesions of oral mucosa: Secondary | ICD-10-CM

## 2023-02-04 DIAGNOSIS — J029 Acute pharyngitis, unspecified: Secondary | ICD-10-CM

## 2023-02-04 DIAGNOSIS — R0981 Nasal congestion: Secondary | ICD-10-CM | POA: Diagnosis not present

## 2023-02-04 DIAGNOSIS — J3089 Other allergic rhinitis: Secondary | ICD-10-CM | POA: Diagnosis not present

## 2023-02-04 DIAGNOSIS — R070 Pain in throat: Secondary | ICD-10-CM

## 2023-02-04 DIAGNOSIS — J342 Deviated nasal septum: Secondary | ICD-10-CM

## 2023-02-04 DIAGNOSIS — K219 Gastro-esophageal reflux disease without esophagitis: Secondary | ICD-10-CM

## 2023-02-04 DIAGNOSIS — R0982 Postnasal drip: Secondary | ICD-10-CM

## 2023-02-04 DIAGNOSIS — R09A2 Foreign body sensation, throat: Secondary | ICD-10-CM

## 2023-02-04 MED ORDER — DESLORATADINE 5 MG PO TABS: 5.0000 mg | ORAL_TABLET | Freq: Every day | ORAL | 3 refills | Status: DC

## 2023-02-04 MED ORDER — AZELASTINE HCL 0.1 % NA SOLN: 2.0000 | Freq: Two times a day (BID) | NASAL | 12 refills | Status: AC

## 2023-02-04 NOTE — Progress Notes (Signed)
ENT CONSULT:  Reason for Consult: Chronic sore throat and history of chronic sinusitis  HPI: Rhonda [name] is an 72 y.o. 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  She was seen by her PCP for the above symptoms, she reportedly was started on antibiotics for her sx, will finish abx in a few days. Thinks it is Augmentin. Feels that her sx are improving. Unfortunately no records available for review, and she is a poor historian/her husband who is accompanying her to her visit today is also not able to provide additional details.  No fevers or chills, sore throat is mild and there is no pain with swallowing, no difficulties with swallowing, no neck masses, no hemoptysis, no voice changes, no changes in her baseline breathing.  Record review indicates history of Alzheimer's disease and dementia, unfortunately patient is not accompanied by anyone today, and collected history somewhat limited by her memory issues/her husband's poor understanding of her care and lack of referring provider records available for review.  There was also some concern about mouth sores in her mouth which was expressed during her visit with her PCP, again no details available.   Records Reviewed:  PCP notes not available/complete for review  Seeing Cardiology and Psychiatry based on record review - hx of cardiomyopathy and CHF, hx of asthma     Past Medical History:  Diagnosis Date   Anemia    Arthritis    Right knee   Asthma    Back pain    Disk problem   Breast cancer, stage 1 (HCC) 03/26/2011   Left; completed  chemotherapy and radiation treatments   Cardiomyopathy    Chronic respiratory failure (HCC) 09/14/2013   Chronic systolic heart failure (HCC)    a) NICM b) ECHO (03/2013) EF 20-25% c) ECHO (09/2013) EF 45-50%, grade I DD   CKD (chronic kidney disease) stage 3, GFR 30-59 ml/min (HCC) 11/08/2018   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    Complication of anesthesia    History of low blood pressure after surgery; attributed to lying flat   Diabetes mellitus    "diet controlled" (05/03/2013)   Exertional shortness of breath    Generalized anxiety disorder    GERD (gastroesophageal reflux disease)    Gout    Heart murmur    Hyperlipidemia    Hypertension    Hypokalemia 11/08/2018   Hypothyroidism    Left bundle branch block    s/p CRT-D (04/2013)   Major depressive disorder    Migraines    On home oxygen therapy    "2L suppose to be q night" (05/03/2013)   Orthostatic hypotension 07/28/2017   Peripheral neuropathy    Feet   SVD (spontaneous vaginal delivery)    x 2   Unspecified vitamin D deficiency 03/26/2011   Does not take meds    Past Surgical History:  Procedure Laterality Date   BI-VENTRICULAR IMPLANTABLE CARDIOVERTER DEFIBRILLATOR N/A 05/03/2013   Procedure: BI-VENTRICULAR IMPLANTABLE CARDIOVERTER DEFIBRILLATOR  (CRT-D);  Surgeon: Marinus Maw, MD;  Location: [facility] CATH LAB;  Service: Cardiovascular;  Laterality: N/A;   BI-VENTRICULAR IMPLANTABLE CARDIOVERTER DEFIBRILLATOR  (  CRT-D)  05/03/2013   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   BIOPSY  05/31/2018   Procedure: BIOPSY;  Surgeon: Kerin Salen, MD;  Location: [facility];  Service: Gastroenterology;;   BIV ICD GENERATOR CHANGEOUT N/A 04/11/2020   Procedure: BIV ICD GENERATOR CHANGEOUT;  Surgeon: Marinus Maw, MD;  Location: [facility] INVASIVE CV LAB;  Service: Cardiovascular;  Laterality: N/A;   BREAST LUMPECTOMY Left 07/28/2010   CARDIAC CATHETERIZATION  2010   NORMAL CORONARY ARTERIES   CLEFT  PALATE REPAIR AS A CHILD--11 SURGERIES     PT HAS REMOVABLE SPEECH BULB-TAKES IT OUT BEFORE HER SURGERY   COLONOSCOPY     COLONOSCOPY N/A 05/31/2018   Procedure: COLONOSCOPY;  Surgeon: Kerin Salen, MD;  Location: [facility] ENDOSCOPY;  Service: Gastroenterology;  Laterality: N/A;   HYSTEROSCOPY WITH D & C  01/20/2012   Procedure: DILATATION AND CURETTAGE /HYSTEROSCOPY;  Surgeon: Meriel Pica, MD;  Location: [facility] ORS;  Service: Gynecology;  Laterality: N/A;  with trueclear   PORT-A-CATH REMOVAL  09/23/2011   Procedure: REMOVAL PORT-A-CATH;  Surgeon: Emelia Loron, MD;  Location: [facility] ORS;  Service: General;  Laterality: N/A;  Port Removal   PORTACATH PLACEMENT  2012   TONSILLECTOMY  1960's   TOTAL KNEE ARTHROPLASTY  05/10/2012   Procedure: TOTAL KNEE ARTHROPLASTY;  Surgeon: Shelda Pal, MD;  Location: [facility] ORS;  Service: Orthopedics;  Laterality: Right;   WISDOM TOOTH EXTRACTION      Family History  Problem Relation Age of Onset   Alzheimer's disease Mother    CVA Mother    Hypertension Mother    Cancer - Colon Father        late 63   Birth defects Paternal Uncle     Social History:  reports that she has been smoking cigarettes. She started smoking about 27 years ago. She has a 2.6 pack-year smoking history. She has never used smokeless tobacco. She reports that she does not drink alcohol and does not use drugs.  Allergies:  Allergies  Allergen Reactions   Ceftin Anaphylaxis    Face and throat swell    Cefuroxime Axetil Anaphylaxis    Face and throat swell   Geodon [Ziprasidone Hcl] Hives   Lisinopril Other (See Comments)    angioedema   Cefuroxime     Other reaction(s): anaphylaxis   Sulfacetamide Sodium-Sulfur     Other reaction(s): itch   Allopurinol Nausea Only and Other (See Comments)    weakness   Ativan [Lorazepam] Itching   Sulfa Antibiotics Itching   Valium [Diazepam] Other (See Comments)    Patient states that diazepam doesn't relax, it has the opposite effect.     Medications: I have reviewed the patient's current medications.  The PMH, PSH, Medications, Allergies, and SH were reviewed and updated.  ROS: Constitutional: Negative for fever, weight loss and weight gain. Cardiovascular: Negative for chest pain and dyspnea on exertion. Respiratory: Is not experiencing shortness of breath at rest. Gastrointestinal: Negative for nausea and vomiting. Neurological: Negative for headaches. Psychiatric: The patient is not nervous/anxious  Blood pressure 108/70, pulse 75, height 5\' 1"  (1.549 m), weight 189 lb 12.8 oz (86.1 kg), SpO2 99%.  PHYSICAL EXAM:  Exam: General: Well-developed, well-nourished Respiratory Respiratory effort: Equal inspiration and expiration without stridor Cardiovascular Peripheral Vascular: Warm extremities with equal color/perfusion Eyes: No nystagmus with equal extraocular motion bilaterally Neuro/Psych/Balance: Patient oriented to person, place, and time; Appropriate mood and affect; Gait is intact with no imbalance; Cranial nerves I-XII  are intact Head and Face Inspection: Normocephalic and atraumatic without mass or lesion, obvious nasal deformity and evidence of well-healed scar along the right upper lip due to history of cleft lip repair in the past Palpation: Facial skeleton intact without bony stepoffs Salivary Glands: No mass or tenderness Facial Strength: Facial motility symmetric and full bilaterally ENT Pinna: External ear intact and fully developed External canal: Canal is patent with intact skin Tympanic Membrane: Clear and mobile External Nose: No scar or anatomic deformity Internal Nose: Septum is relatively straight on anterior rhinoscopy. No polyp, or purulence. Mucosal edema and erythema present.  Bilateral inferior turbinate hypertrophy.  Lips, Teeth, and gums: No evidence of gum disease or odontogenic infection TMJ: No pain to palpation with full mobility Oral cavity/oropharynx: No erythema or  exudate, no lesions present, no residual. tonsillar tissue no masses or lesions.  No lesions leukoplakia or mucosal ulcerations.  Complete cleft palate defect with the involvement of both soft and hard palate, which appears to connect to the right nasal passage.  Nasopharynx: See procedure note below, findings consistent with complete palate defect connecting to the right nasal passage septal deviation inferior turbinate hypertrophy, mucosal edema no purulence or polyps Larynx: See procedure note below, no masses or lesions no ulcerations or leukoplakia, bilateral vocal folds were mobile but atrophic, moderate postcricoid edema and pachydermia consistent with GERD Neck Neck and Trachea: Midline trachea without mass or lesion Thyroid: No mass or nodularity Lymphatics: No lymphadenopathy  Procedure:    PROCEDURE NOTE: nasal endoscopy  Preoperative diagnosis: chronic sinusitis symptoms  Postoperative diagnosis: same  Procedure: Diagnostic nasal endoscopy (22025)  Surgeon: Ashok Croon, M.D.  Anesthesia: Topical lidocaine and Afrin  H&P REVIEW: The patient's history and physical were reviewed today prior to procedure. All medications were reviewed and updated as well. Complications: None Condition is stable throughout exam Indications and consent: The patient presents with symptoms of chronic sinusitis not responding to previous therapies. All the risks, benefits, and potential complications were reviewed with the patient preoperatively and informed consent was obtained. The time out was completed with confirmation of the correct procedure.   Procedure: The patient was seated upright in the clinic. Topical lidocaine and Afrin were applied to the nasal cavity. After adequate anesthesia had occurred, the rigid nasal endoscope was passed into the nasal cavity. The nasal mucosa, turbinates, septum, and sinus drainage pathways were visualized bilaterally. This revealed no purulence or  significant secretions that might be cultured.  There was evidence of significant septal deviation and inferior turbinate hypertrophy. there were no polyps or sites of significant inflammation.  The cleft palate defect which involves both soft and hard palate appears to connect to the right nasal passage, left nasal passage with intact floor, the mucosa was intact and there was no crusting present. The scope was then slowly withdrawn and the patient tolerated the procedure well. There were no complications or blood loss.  Preoperative diagnosis: History of cleft lip and palate, concern for oral versus oropharyngeal lesions, patient poor historian due to history of Alzheimer's  Postoperative diagnosis:   Same + GERD LPR  Procedure: Flexible fiberoptic laryngoscopy  Surgeon: Ashok Croon, MD  Anesthesia: Topical lidocaine and Afrin Complications: None Condition is stable throughout exam  Indications and consent:  The patient presents to the clinic with Indirect laryngoscopy view was incomplete. Thus it was recommended that they undergo a flexible fiberoptic laryngoscopy. All of the risks, benefits, and potential complications were reviewed with the patient preoperatively and verbal informed consent  was obtained.  Procedure: The patient was seated upright in the clinic. Topical lidocaine and Afrin were applied to the nasal cavity. After adequate anesthesia had occurred, I then proceeded to pass the flexible telescope into the nasal cavity. The nasal cavity was patent without rhinorrhea or polyp. The nasopharynx was also patent without mass or lesion. The base of tongue was visualized and was normal. There were no signs of pooling of secretions in the piriform sinuses. The true vocal folds were mobile bilaterally. There were no signs of glottic or supraglottic mucosal lesion or mass. There was moderate interarytenoid pachydermia and post cricoid edema. The telescope was then slowly withdrawn and the  patient tolerated the procedure throughout.   Studies Reviewed: CT head 07/26/2022 TECHNIQUE: Contiguous axial images were obtained from the base of the skull through the vertex without intravenous contrast.   RADIATION DOSE REDUCTION: This exam was performed according to the departmental dose-optimization program which includes automated exposure control, adjustment of the mA and/or kV according to patient size and/or use of iterative reconstruction technique.   COMPARISON:  CT head dated December 01, 2021.   FINDINGS: Brain: No evidence of acute infarction, hemorrhage, hydrocephalus, extra-axial collection or mass lesion/mass effect.   Vascular: No hyperdense vessel or unexpected calcification.   Skull: No fracture or focal lesion.   Sinuses/Orbits: No acute finding.   Other: None.   IMPRESSION: 1. No acute intracranial abnormality.     Assessment/Plan: Encounter Diagnoses  Name Primary?   Sore throat Yes   Throat discomfort    Nasal congestion    Post-nasal drainage    Mouth sores    Environmental and seasonal allergies    Deviated nasal septum    2 weeks of sore throat, no fever, no cough, on antibiotic given by PCP, still has a few days left but reports improvement in her symptoms.  Poor historian due to history of Alzheimer's disease no records available from PCP and referring provider for review.  History of unrepaired cleft palate that appears to connect to nasal passage, which could predispose the patient to chronic sinus congestion and inflammation/chronic sinusitis.  She does not have any dedicated sinus CT performed in the past, CT brain July 2023 with portions of the paranasal sinuses imaged did not show chronic sinus inflammation.  I did not see purulence or polyps on my exam which included nasal endoscopy, likelihood of chronic sinus disease is low at this time will defer CT sinuses at this point will consider in the future if symptoms persist.  Suspect allergies  and postnasal drainage.  Will initiate treatment below:  Clarinex and Azelastine for 3-6 months  2. Sore throat/globus sensation -chronic-no concerning findings and no lesions or masses on exam today including oropharyngeal exam and flexible laryngoscopy, due to history and evidence of postnasal drainage and nasal congestion suspect an element of that plays a role in her symptoms, additionally she could have undiagnosed GERD LPR, currently on multiple medications, to avoid polypharmacy will hold off on reflux medications, I provided a handout about reflux Gourmet and reviewed diet and lifestyle changes to minimize GERD LPR with the patient and her husband - gargle with salt water   3.  Concern for mouth and oral sores, on my exam today which included visual exam of the mouth and oropharynx as well as flexible laryngoscopy and bilateral nasal endoscopy there is no evidence of masses tumors or lesions.   I advised the patient to try above measures for 3 to 6 months  and return if symptoms persist, they were instructed to see PCP for any acute worsening of symptoms or new symptoms   Thank you for allowing me to participate in the care of this patient. Please do not hesitate to contact me with any questions or concerns.   Ashok Croon, MD Otolaryngology [facility] Phone: [phone] Fax: [phone]    02/05/2023, 7:31 AM

## 2023-02-04 NOTE — Patient Instructions (Addendum)
-   try allergy pill and nasal spray - Clarinex and Azelastine for 3-6 months - gargle with salt water  - return if symptoms will not improve   - Take Reflux Gourmet (natural supplement available on Amazon) to help with symptoms of chronic throat irritation

## 2023-02-08 ENCOUNTER — Other Ambulatory Visit (HOSPITAL_COMMUNITY): Payer: Self-pay | Admitting: Emergency Medicine

## 2023-02-08 NOTE — Progress Notes (Signed)
Paramedicine Encounter    Patient ID: Rhonda [name], female    DOB: 1951-05-09, 72 y.o.   MRN: [id number]   Complaints - gout flare up, denies increased shortness of breath, denies chest pain or dizziness.    Assessment - Lung sounds clear +1 edema, increased swelling on left ankle where reported gout flare up is.  Compliance with meds -  no noted missed doses. Noted 2x torsemide pills missed by accident   Pill box filled - for 1x week   Refills needed - Clarinex  Meds changes since last visit - added 5mg  Clarinex by ENT     Social changes - none   VISIT SUMMARY** Met with Rhonda [name] in the home. Pt was doing well and had great compliance on her medications. Pt reported that Thursday, she started having onset of swelling and pain to her right ankle. Pt denies any injury, just advised it came out of no where. Pt was assisted in calling PCP regarding same during visit and message left with Nurse. Pt was assessed as noted. Pt had no noted weight gain. Pt denied any shortness of breath at rest, just on exertion. Pt went to her ENT last week and was placed on Clarinex. Prescription was sent to [facility], will follow up on same. Pill box filled for 1x week. Appointments reviewed. Will follow up in the home in 1x week.   BP 108/60   Pulse 82   Resp 16   Wt 187 lb 6.4 oz (85 kg)   SpO2 98%   BMI 35.41 kg/m  Weight yesterday-187 lbs  Last visit weight-187 lbs   [name] [facility] EMT-P Community Paramedic  [phone]     ACTION: Home visit completed     Patient Care Team: Shon Hale, MD as PCP - General (Family Medicine) Burna Sis, LCSW as Social Worker (Licensed Clinical Social Worker) [name], Noberto Retort, DO as Consulting Physician (Neurology) [name], Paramedic as Paramedic Chanetta Marshall, Meridee Score, MD as Attending Physician (Family Medicine) Clinton Gallant, RN as [facility] Care Management Bensimhon, Bevelyn Buckles, MD as Consulting Physician  (Cardiology) Lolly Mustache, Phillips Grout, MD as Consulting Physician (Psychiatry) Candelaria Stagers, DPM as Consulting Physician (Podiatry)  Patient Active Problem List   Diagnosis Date Noted   Major depressive disorder, recurrent episode, severe (HCC) 07/27/2022   MDD (major depressive disorder), recurrent severe, without psychosis (HCC) 07/27/2022   MDD (major depressive disorder) 07/27/2022   Elevated troponin 12/02/2021   Nausea & vomiting 12/01/2021   Acute left ankle pain 10/11/2021   Intractable nausea and vomiting 10/08/2021   AKI (acute kidney injury) (HCC) 10/08/2021   Hyponatremia 10/08/2021   Decreased estrogen level 08/01/2021   Dementia (HCC) 08/01/2021   Frail elderly 08/01/2021   Hyperparathyroidism (HCC) 08/01/2021   Spondylolisthesis 08/01/2021   Varicose veins of bilateral lower extremities with other complications 08/01/2021   Lumbago without sciatica 04/30/2021   Abnormal gait 06/11/2020   Adult failure to thrive syndrome 06/11/2020   Allergic rhinitis 06/11/2020   Anxiety 06/11/2020   Asthma 06/11/2020   Benign intracranial hypertension 06/11/2020   Body mass index (BMI) 45.0-49.9, adult (HCC) 06/11/2020   Bowel incontinence 06/11/2020   Cholelithiasis 06/11/2020   Chronic sinusitis 06/11/2020   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   Daytime somnolence 06/11/2020   Diarrhea of presumed infectious origin 06/11/2020   Edema 06/11/2020   Family history of malignant neoplasm of gastrointestinal tract 06/11/2020   Gout 06/11/2020   History of fall 06/11/2020   History  of infectious disease 06/11/2020   Insomnia 06/11/2020   Irregular bowel habits 06/11/2020   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   Lumbar spondylosis with myelopathy 06/11/2020   Macrocytosis 06/11/2020   Mild recurrent major depression (HCC) 06/11/2020   Personal history of colonic polyps 06/11/2020   Personal history of malignant neoplasm of breast 06/11/2020   Repeated falls 06/11/2020   Mixed anxiety and depressive  disorder 06/11/2020   Irritable bowel syndrome 01/04/2020   Spinal stenosis of lumbar region 01/03/2020   Other symptoms and signs involving the musculoskeletal system 09/11/2019   Bilateral leg weakness 08/01/2019   Leg swelling 08/01/2019   Diabetes mellitus with neuropathy (HCC) 06/15/2019   Hypokalemia 11/08/2018   CKD (chronic kidney disease) stage 3, GFR 30-59 ml/min (HCC) 11/08/2018   Orthostatic hypotension 07/28/2017   SVD (spontaneous vaginal delivery)    Peripheral neuropathy    On home oxygen therapy    Migraines    Left bundle branch block    Hypothyroidism    Hypertension    Hyperlipidemia    Heart murmur    GERD (gastroesophageal reflux disease)    Exertional shortness of breath    Major depressive disorder    Back pain    Arthritis    Generalized anxiety disorder    Anemia    Chronic respiratory failure (HCC) 09/14/2013   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 Use in each nostril as directed, Disp: 30 mL, Rfl: 12   buPROPion (WELLBUTRIN XL) 150 MG 24 hr tablet, Take 1 tablet (150 mg total) by mouth daily., Disp: 30 tablet, Rfl: 0   carvedilol (COREG) 3.125 MG tablet, TAKE 1 TABLET (3.125 MG) BY MOUTH TWICE DAILY WITH MEALS, Disp: 180 tablet, Rfl: 3   citalopram (CELEXA) 20 MG tablet, Take 1 tablet (20 mg total) by mouth daily., Disp: 30 tablet, Rfl: 0   clonazePAM (KLONOPIN) 0.5 MG tablet,  Take 1 tablet (0.5 mg total) by mouth 2 (two) times daily., Disp: 60 tablet, Rfl: 0   cyanocobalamin (VITAMIN B12) 1000 MCG tablet, Take 1,000 mcg by mouth daily., Disp: , Rfl:    dicyclomine (BENTYL) 20 MG tablet, Take 20 mg by mouth 3 (three) times daily before meals., Disp: , Rfl:    fluticasone (FLONASE) 50 MCG/ACT nasal spray, Place 1 spray into both nostrils daily., Disp: , Rfl:    gabapentin (NEURONTIN) 100 MG capsule, Take 1 capsule (100 mg total) by mouth 2 (two) times daily., Disp: 180 capsule, Rfl: 3   levothyroxine (SYNTHROID) 88 MCG tablet, TAKE 1 TABLET(88 MCG) BY MOUTH DAILY BEFORE BREAKFAST, Disp: 90 tablet, Rfl: 3   memantine (NAMENDA) 5 MG tablet, Take 1 tablet (5 mg total) by mouth daily., Disp: 30 tablet, Rfl: 3   metolazone (ZAROXOLYN) 2.5 MG tablet, TAKE 1 TABLET BY MOUTH EVERY OTHER TUESDAY., Disp: 12 tablet, Rfl: 3   midodrine (PROAMATINE) 5 MG tablet, Take 1 tablet (5 mg total) by  mouth 3 (three) times daily., Disp: 200 tablet, Rfl: 3   Multiple Vitamin (MULTIVITAMIN WITH MINERALS) TABS tablet, Take 1 tablet by mouth daily., Disp: , Rfl:    Ondansetron HCl (ZOFRAN PO), 4 mg. Take 1 tablet on the tongue and allow to dissolve, Disp: , Rfl:    potassium chloride SA (KLOR-CON M) 20 MEQ tablet, TAKE 2 TABLETS THREE TIMES DAILY. TAKE AN EXTRA 2 TABLETS ON DAYS YOU TAKE METOLAZONE, Disp: 572 tablet, Rfl: 3   risperiDONE (RISPERDAL) 0.5 MG tablet, Take 1 tablet (0.5 mg total) by mouth 2 (two) times daily at 8 am and 4 pm., Disp: 60 tablet, Rfl: 0   simvastatin (ZOCOR) 10 MG tablet, TAKE 1 TABLET EVERY EVENING, Disp: 90 tablet, Rfl: 3   desloratadine (CLARINEX) 5 MG tablet, Take 1 tablet (5 mg total) by mouth daily. (Patient not taking: Reported on 02/08/2023), Disp: 90 tablet, Rfl: 3   spironolactone (ALDACTONE) 25 MG tablet, TAKE 1 TABLET (25 MG TOTAL) BY MOUTH EVERY EVENING., Disp: 90 tablet, Rfl: 3   torsemide (DEMADEX) 20 MG tablet, Take 4 tablets (80 mg total) by mouth 2 (two)  times daily., Disp: 240 tablet, Rfl: 6 Allergies  Allergen Reactions   Ceftin Anaphylaxis    Face and throat swell    Cefuroxime Axetil Anaphylaxis    Face and throat swell   Geodon [Ziprasidone Hcl] Hives   Lisinopril Other (See Comments)    angioedema   Cefuroxime     Other reaction(s): anaphylaxis   Sulfacetamide Sodium-Sulfur     Other reaction(s): itch   Allopurinol Nausea Only and Other (See Comments)    weakness   Ativan [Lorazepam] Itching   Sulfa Antibiotics Itching   Valium [Diazepam] Other (See Comments)    Patient states that diazepam doesn't relax, it has the opposite effect.     Social History   Socioeconomic History   Marital status: Married    Spouse name: Not on file   Number of children: 2   Years of education: 53   Highest education level: Master's degree (e.g., MA, MS, MEng, MEd, MSW, MBA)  Occupational History   Occupation: retired  Tobacco Use   Smoking status: Some Days    Current packs/day: 0.00    Average packs/day: 0.1 packs/day for 26.0 years (2.6 ttl pk-yrs)    Types: Cigarettes    Start date: 03/24/1995    Last attempt to quit: 03/23/2021    Years since quitting: 1.8   Smokeless tobacco: Never  Vaping Use   Vaping status: Never Used  Substance and Sexual Activity   Alcohol use: No   Drug use: No   Sexual activity: Yes  Other Topics Concern   Not on file  Social History Narrative   Tobacco Use Cigarettes: Former Smoker, Quit in 2008   No Alcohol   No recreational drug use   Diet: Regular/Low Carb   Exercise: None   Occupation: disabled   Education: Company secretary, masters   Children: 2   Firearms: No   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      Social Determinants of Health   Financial Resource Strain: Not on file  Food Insecurity: No Food Insecurity (08/11/2022)   Hunger Vital Sign    Worried About Running Out of Food in the Last Year: Never true    Ran Out of Food in the Last  Year: Never true  Transportation Needs: No Transportation  Needs (08/11/2022)   PRAPARE - Administrator, Civil Service (Medical): No    Lack of Transportation (Non-Medical): No  Physical Activity: Not on file  Stress: No Stress Concern Present (08/11/2022)   [name] of Occupational Health - Occupational Stress Questionnaire    Feeling of Stress : Only a little  Social Connections: Not on file  Intimate Partner Violence: Not At Risk (07/27/2022)   Humiliation, Afraid, Rape, and Kick questionnaire    Fear of Current or Ex-Partner: No    Emotionally Abused: No    Physically Abused: No    Sexually Abused: No    Physical Exam      Future Appointments  Date Time Provider Department Center  02/10/2023  7:00 AM [facility] DEVICE REMOTES [facility]  03/04/2023  3:40 PM Arfeen, Phillips Grout, MD [facility] None  03/15/2023  7:10 AM [facility] DEVICE REMOTES [facility]  06/02/2023  1:45 PM Freddie Breech, DPM [facility]  06/14/2023  7:10 AM [facility] DEVICE REMOTES [facility]  06/29/2023  1:30 PM Nita Sickle K, DO [facility] None  09/13/2023  7:10 AM [facility] DEVICE REMOTES [facility]  12/22/2023  8:30 AM Rosann Auerbach, PhD [facility] None  12/22/2023  9:30 AM [facility] None  12/30/2023 10:00 AM Rosann Auerbach, PhD [facility] None

## 2023-02-10 ENCOUNTER — Ambulatory Visit: Payer: Medicare HMO | Attending: Internal Medicine

## 2023-02-10 ENCOUNTER — Ambulatory Visit (INDEPENDENT_AMBULATORY_CARE_PROVIDER_SITE_OTHER): Payer: Medicare HMO | Admitting: Podiatry

## 2023-02-10 DIAGNOSIS — N1832 Chronic kidney disease, stage 3b: Secondary | ICD-10-CM | POA: Diagnosis not present

## 2023-02-10 DIAGNOSIS — Z95810 Presence of automatic (implantable) cardiac defibrillator: Secondary | ICD-10-CM

## 2023-02-10 DIAGNOSIS — I5022 Chronic systolic (congestive) heart failure: Secondary | ICD-10-CM

## 2023-02-10 DIAGNOSIS — M79672 Pain in left foot: Secondary | ICD-10-CM | POA: Diagnosis not present

## 2023-02-10 DIAGNOSIS — Z91199 Patient's noncompliance with other medical treatment and regimen due to unspecified reason: Secondary | ICD-10-CM

## 2023-02-10 NOTE — Progress Notes (Signed)
No show

## 2023-02-10 NOTE — Progress Notes (Signed)
EPIC Encounter for ICM Monitoring  Patient Name: Rhonda [name] is a 72 y.o. female Date: 02/10/2023 Primary Care Physican: Shon Hale, MD Primary Cardiologist: Bensimhon Electrophysiologist: Rae Roam Pacing: 98.9%   06/01/2022 Weight: 193 lbs  07/08/2022 Weight: 183 lbs 08/10/2022 Weight: 185 lbs 11/25/2022 Weight: 180 lbs 12/30/2022 Weight: 184 lbs          Spoke with husband and heart failure questions reviewed.  Transmission results reviewed.  Pt asymptomatic for fluid accumulation.  Pt was having some foot and swelling and at the PCP office to be evaluated.  Did not have any fluid symptoms during decreased impedance.                  Optivol thoracic impedance suggesting possible fluid accumulation starting 8/29 and returned close to baseline.     Prescribed:  Patient participating in paramedicine program to assist with meds. Torsemide 20 mg 4 tablets (80 mg total) by mouth twice a day  Potassium 20 meq 2 tablets (40 mEq total) by mouth 3 (three) times daily.  Take extra 2 tablets on days you take Metolazone.   Metolazone 2.5mg  1 tablet every other Tuesday.  (next dosage due 03/09/23) Spironolactone 25 mg take 1 tablet by mouth every evening.   Labs: 01/13/2023 Creatinine 1.62, BUN 28, Potassium 3.5, Sodium 134, GFR 34 12/30/2022 Creatinine 1.64, BUN 31, Potassium 3.6, Sodium 134, GFR 33 12/02/2022 Creatinine 1.68 BUN 23,  Potassium 4.0, Sodium 136, GFR 33 08/01/2022 Creatinine 1.47, BUN 42, Potassium 3.1, Sodium 136, GFR 38  07/26/2022 Creatinine 2.02, BUN 25, Potassium 4.6, Sodium 133, GFR 26  07/22/2022 Creatinine 1.57, BUN 20, Potassium 3.8, Sodium 136, GFR 35  07/14/2022 Creatinine 1.32, BUN 20, Potassium 4.2, Sodium 138, GFR 44 06/25/2022 Creatinine 2.19, BUN 29, Potassium 3.5, Sodium 134, GFR 24 A complete set of results can be found in Results Review.   Recommendations:   Recommendation to limit salt intake to 2000 mg daily and fluid intake to 64 oz daily.   Encouraged to call if experiencing any fluid symptoms.    Follow-up plan: ICM clinic phone appointment on 03/16/2023.   91 day device clinic remote transmission 03/15/2023.     EP/Cardiology Office Visits:  Recall 05/29/2023 with Dr Gala Romney.  Recall 03/20/2023 with Dr Ladona Ridgel.     Copy of ICM check sent to Dr. Ladona Ridgel.   3 month ICM trend: 02/10/2023.    12-14 Month ICM trend:     Karie Soda, RN 02/10/2023 8:02 AM

## 2023-02-15 ENCOUNTER — Other Ambulatory Visit (HOSPITAL_COMMUNITY): Payer: Self-pay | Admitting: Emergency Medicine

## 2023-02-15 NOTE — Progress Notes (Signed)
Paramedicine Encounter    Patient ID: Rhonda [name], female    DOB: 11-20-1950, 72 y.o.   MRN: [id number]   Complaints - headache, increased congestion/productive cough, depression/lonliness  Assessment - lung sounds clear, no edema   Compliance with meds - Missed 1 day worth of doses   Pill box filled - filled for 1x week  Refills needed - torsemide, namenda  Meds changes since last visit - addition of Desloratadine 5 mg     Social changes - none   VISIT SUMMARY**  Met with Rhonda [name] in the home today. Pt reported feeling down recently. Pt requested a therapist, but reported that her husband always speaks for her, so past in person attempts have been unsuccessful. Pt requested a "tele-health" resource that she could talk to while she's at her home all day alone. Pt had no SI/HI thoughts at present, just reported loneliness and frustration with her increasing memory problems. Will follow up with resources opportunities next week. Pt was also educated on her diet and fluid intake. Pt reported having a bowl of wonton soup for breakfast and a bowl for lunch. Pt was informed of the sodium and fluid intake that her decision equated to. I tried to use her bowls to demonstrate her fluid restriction. And tried to reiterate her sodium restriction using nutrition labels. Pt reports that her husband brings home take out and that's her option. Will check on the possibility to reinstate [employer] or similar to aid her in staying on diet. Pt's pill box was filled for 1x week. Upcoming appointments reviewed. Will follow up in the home in 1x week.    BP 118/70   Pulse 84   Wt 187 lb 6.4 oz (85 kg)   SpO2 99%   BMI 35.41 kg/m  Last visit weight: 187 lbs Yesterday's weight: DNW  [name] [facility] EMT-P Community Paramedic  [phone]     ACTION: Home visit completed     Patient Care Team: Shon Hale, MD as PCP - General (Family Medicine) Burna Sis, LCSW as Social Worker  (Licensed Clinical Social Worker) [name], Noberto Retort, DO as Consulting Physician (Neurology) [name], Paramedic as Paramedic Chanetta Marshall, Meridee Score, MD as Attending Physician (Family Medicine) Clinton Gallant, RN as [facility] Care Management Bensimhon, Bevelyn Buckles, MD as Consulting Physician (Cardiology) Lolly Mustache, Phillips Grout, MD as Consulting Physician (Psychiatry) Candelaria Stagers, DPM as Consulting Physician (Podiatry)  Patient Active Problem List   Diagnosis Date Noted   Major depressive disorder, recurrent episode, severe (HCC) 07/27/2022   MDD (major depressive disorder), recurrent severe, without psychosis (HCC) 07/27/2022   MDD (major depressive disorder) 07/27/2022   Elevated troponin 12/02/2021   Nausea & vomiting 12/01/2021   Acute left ankle pain 10/11/2021   Intractable nausea and vomiting 10/08/2021   AKI (acute kidney injury) (HCC) 10/08/2021   Hyponatremia 10/08/2021   Decreased estrogen level 08/01/2021   Dementia (HCC) 08/01/2021   Frail elderly 08/01/2021   Hyperparathyroidism (HCC) 08/01/2021   Spondylolisthesis 08/01/2021   Varicose veins of bilateral lower extremities with other complications 08/01/2021   Lumbago without sciatica 04/30/2021   Abnormal gait 06/11/2020   Adult failure to thrive syndrome 06/11/2020   Allergic rhinitis 06/11/2020   Anxiety 06/11/2020   Asthma 06/11/2020   Benign intracranial hypertension 06/11/2020   Body mass index (BMI) 45.0-49.9, adult (HCC) 06/11/2020   Bowel incontinence 06/11/2020   Cholelithiasis 06/11/2020   Chronic sinusitis 06/11/2020   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   Daytime  somnolence 06/11/2020   Diarrhea of presumed infectious origin 06/11/2020   Edema 06/11/2020   Family history of malignant neoplasm of gastrointestinal tract 06/11/2020   Gout 06/11/2020   History of fall 06/11/2020   History of infectious disease 06/11/2020   Insomnia 06/11/2020   Irregular bowel habits 06/11/2020   Atrophic  gastritis 06/11/2020   Lumbar spondylosis with myelopathy 06/11/2020   Macrocytosis 06/11/2020   Mild recurrent major depression (HCC) 06/11/2020   Personal history of colonic polyps 06/11/2020   Personal history of malignant neoplasm of breast 06/11/2020   Repeated falls 06/11/2020   Mixed anxiety and depressive disorder 06/11/2020   Irritable bowel syndrome 01/04/2020   Spinal stenosis of lumbar region 01/03/2020   Other symptoms and signs involving the musculoskeletal system 09/11/2019   Bilateral leg weakness 08/01/2019   Leg swelling 08/01/2019   Diabetes mellitus with neuropathy (HCC) 06/15/2019   Hypokalemia 11/08/2018   CKD (chronic kidney disease) stage 3, GFR 30-59 ml/min (HCC) 11/08/2018   Orthostatic hypotension 07/28/2017   SVD (spontaneous vaginal delivery)    Peripheral neuropathy    On home oxygen therapy    Migraines    Left bundle branch block    Hypothyroidism    Hypertension    Hyperlipidemia    Heart murmur    GERD (gastroesophageal reflux disease)    Exertional shortness of breath    Major depressive disorder    Back pain    Arthritis    Generalized anxiety disorder    Anemia    Chronic respiratory failure (HCC) 09/14/2013   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/04/2013   Morbid obesity (HCC) 11/01/2012   Chronic systolic heart failure (HCC) 10/27/2012   Endometrial polyp 01/20/2012   Malignant tumor of breast (HCC) 03/26/2011   Unspecified vitamin D deficiency 03/26/2011    Current Outpatient Medications:    acetaZOLAMIDE (DIAMOX) 125 MG tablet, Take 1 tablet (125 mg total) by mouth daily., Disp: 90 tablet, Rfl: 3   Ascorbic Acid (VITAMIN C) 1000 MG tablet, Take 1,000 mg by mouth daily., Disp: , Rfl:    buPROPion (WELLBUTRIN XL) 150 MG 24 hr tablet, Take 1 tablet (150 mg total) by mouth daily., Disp: 30 tablet, Rfl: 0   carvedilol (COREG) 3.125 MG tablet, TAKE 1 TABLET (3.125 MG) BY MOUTH TWICE DAILY WITH MEALS,  Disp: 180 tablet, Rfl: 3   citalopram (CELEXA) 20 MG tablet, Take 1 tablet (20 mg total) by mouth daily., Disp: 30 tablet, Rfl: 0   clonazePAM (KLONOPIN) 0.5 MG tablet, Take 1 tablet (0.5 mg total) by mouth 2 (two) times daily., Disp: 60 tablet, Rfl: 0   cyanocobalamin (VITAMIN B12) 1000 MCG tablet, Take 1,000 mcg by mouth daily., Disp: , Rfl:    desloratadine (CLARINEX) 5 MG tablet, Take 1 tablet (5 mg total) by mouth daily., Disp: 90 tablet, Rfl: 3   dicyclomine (BENTYL) 20 MG tablet, Take 20 mg by mouth 3 (three) times daily before meals., Disp: , Rfl:    fluticasone (FLONASE) 50 MCG/ACT nasal spray, Place 1 spray into both nostrils daily., Disp: , Rfl:    gabapentin (NEURONTIN) 100 MG capsule, Take 1 capsule (100 mg total) by mouth 2 (two) times daily., Disp: 180 capsule, Rfl: 3   levothyroxine (SYNTHROID) 88 MCG tablet, TAKE 1 TABLET(88 MCG) BY MOUTH DAILY BEFORE BREAKFAST, Disp: 90 tablet, Rfl: 3   memantine (NAMENDA) 5 MG tablet, Take 1 tablet (5 mg total) by mouth daily., Disp: 30 tablet, Rfl: 3   metolazone (ZAROXOLYN)  2.5 MG tablet, TAKE 1 TABLET BY MOUTH EVERY OTHER TUESDAY., Disp: 12 tablet, Rfl: 3   midodrine (PROAMATINE) 5 MG tablet, Take 1 tablet (5 mg total) by mouth 3 (three) times daily., Disp: 200 tablet, Rfl: 3   Multiple Vitamin (MULTIVITAMIN WITH MINERALS) TABS tablet, Take 1 tablet by mouth daily., Disp: , Rfl:    Ondansetron HCl (ZOFRAN PO), 4 mg. Take 1 tablet on the tongue and allow to dissolve, Disp: , Rfl:    potassium chloride SA (KLOR-CON M) 20 MEQ tablet, TAKE 2 TABLETS THREE TIMES DAILY. TAKE AN EXTRA 2 TABLETS ON DAYS YOU TAKE METOLAZONE, Disp: 572 tablet, Rfl: 3   risperiDONE (RISPERDAL) 0.5 MG tablet, Take 1 tablet (0.5 mg total) by mouth 2 (two) times daily at 8 am and 4 pm., Disp: 60 tablet, Rfl: 0   simvastatin (ZOCOR) 10 MG tablet, TAKE 1 TABLET EVERY EVENING, Disp: 90 tablet, Rfl: 3   spironolactone (ALDACTONE) 25 MG tablet, TAKE 1 TABLET (25 MG TOTAL) BY  MOUTH EVERY EVENING., Disp: 90 tablet, Rfl: 3   torsemide (DEMADEX) 20 MG tablet, Take 4 tablets (80 mg total) by mouth 2 (two) times daily., Disp: 240 tablet, Rfl: 6   azelastine (ASTELIN) 0.1 % nasal spray, Place 2 sprays into both nostrils 2 (two) times daily. Use in each nostril as directed (Patient not taking: Reported on 02/15/2023), Disp: 30 mL, Rfl: 12 Allergies  Allergen Reactions   Ceftin Anaphylaxis    Face and throat swell    Cefuroxime Axetil Anaphylaxis    Face and throat swell   Geodon [Ziprasidone Hcl] Hives   Lisinopril Other (See Comments)    angioedema   Cefuroxime     Other reaction(s): anaphylaxis   Sulfacetamide Sodium-Sulfur     Other reaction(s): itch   Allopurinol Nausea Only and Other (See Comments)    weakness   Ativan [Lorazepam] Itching   Sulfa Antibiotics Itching   Valium [Diazepam] Other (See Comments)    Patient states that diazepam doesn't relax, it has the opposite effect.     Social History   Socioeconomic History   Marital status: Married    Spouse name: Not on file   Number of children: 2   Years of education: 76   Highest education level: Master's degree (e.g., MA, MS, MEng, MEd, MSW, MBA)  Occupational History   Occupation: retired  Tobacco Use   Smoking status: Some Days    Current packs/day: 0.00    Average packs/day: 0.1 packs/day for 26.0 years (2.6 ttl pk-yrs)    Types: Cigarettes    Start date: 03/24/1995    Last attempt to quit: 03/23/2021    Years since quitting: 1.9   Smokeless tobacco: Never  Vaping Use   Vaping status: Never Used  Substance and Sexual Activity   Alcohol use: No   Drug use: No   Sexual activity: Yes  Other Topics Concern   Not on file  Social History Narrative   Tobacco Use Cigarettes: Former Smoker, Quit in 2008   No Alcohol   No recreational drug use   Diet: Regular/Low Carb   Exercise: None   Occupation: disabled   Education: Company secretary, masters   Children: 2   Firearms: No   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      Social Determinants of Health   Financial Resource Strain: Not on file  Food Insecurity: No Food Insecurity (08/11/2022)  Hunger Vital Sign    Worried About Running Out of Food in the Last Year: Never true    Ran Out of Food in the Last Year: Never true  Transportation Needs: No Transportation Needs (08/11/2022)   PRAPARE - Administrator, Civil Service (Medical): No    Lack of Transportation (Non-Medical): No  Physical Activity: Not on file  Stress: No Stress Concern Present (08/11/2022)   [name] of Occupational Health - Occupational Stress Questionnaire    Feeling of Stress : Only a little  Social Connections: Not on file  Intimate Partner Violence: Not At Risk (07/27/2022)   Humiliation, Afraid, Rape, and Kick questionnaire    Fear of Current or Ex-Partner: No    Emotionally Abused: No    Physically Abused: No    Sexually Abused: No    Physical Exam      Future Appointments  Date Time Provider Department Center  03/04/2023  3:40 PM Arfeen, Phillips Grout, MD [facility] None  03/15/2023  7:10 AM [facility] DEVICE REMOTES [facility]  03/16/2023  7:00 AM [facility] DEVICE REMOTES [facility]  06/02/2023  1:45 PM Freddie Breech, DPM [facility]  06/14/2023  7:10 AM [facility] DEVICE REMOTES [facility]  06/29/2023  1:30 PM Nita Sickle K, DO [facility] None  09/13/2023  7:10 AM [facility] DEVICE REMOTES [facility]  12/22/2023  8:30 AM Rosann Auerbach, PhD [facility] None  12/22/2023  9:30 AM [facility] TE[facility] None  12/30/2023 10:00 AM Rosann Auerbach, PhD [facility] None

## 2023-02-16 ENCOUNTER — Telehealth (HOSPITAL_BASED_OUTPATIENT_CLINIC_OR_DEPARTMENT_OTHER): Payer: Self-pay | Admitting: Licensed Clinical Social Worker

## 2023-02-16 ENCOUNTER — Telehealth (HOSPITAL_COMMUNITY): Payer: Self-pay

## 2023-02-16 NOTE — Telephone Encounter (Signed)
H&V Care Navigation CSW Progress Note  Clinical Social Worker  received referral from paramedicine team  to f/u on food resources. Pt spouse works making healthy meal options difficult to obtain. LCSW called pt plan regarding any benefits as she has maxed out her current eligibility through [facility] for Mom's Meals per [place], LCSW's previous notes.   Pt has benefit for $0 copay meals (2 meals daily, for 7 days) through [employer] (a benefit of her [phone]) unfortunately these are post d/c meals that can only be obtained 30 days post discharge from hospital/SNF. No recent d/c noted. I have sent f/u email message to Meals on Wheels of [facility] managed by [employer] [place]. Also cc'd team lead Annice Pih, LCSW at HF clinic to ensure no additional options should be pursued. Also if appropriate pt may benefit from Care Navigation Health Coaching for healthy choices.  Patient is participating in a Managed Medicaid Plan:  No, [employer] Medicare  SDOH Screenings   Food Insecurity: No Food Insecurity (08/11/2022)  Housing: Low Risk  (08/11/2022)  Transportation Needs: No Transportation Needs (08/11/2022)  Utilities: Not At Risk (08/11/2022)  Alcohol Screen: Low Risk  (07/27/2022)  Depression (PHQ2-9): Low Risk  (08/11/2022)  Stress: No Stress Concern Present (08/11/2022)  Tobacco Use: High Risk (02/04/2023)   Octavio Graves, MSW, LCSW Clinical Social Worker II [facility] Heart/Vascular Care Navigation  [phone]- work cell phone (preferred) [phone]- desk phone

## 2023-02-16 NOTE — Telephone Encounter (Signed)
Sarah with Paramedicine reports Mrs. Pund is having trouble getting decent meals in the home as her husband works a full time job and she cannot cook for herself. [name] states that Keran was eating chinese soup take out for lunch and has been coached the importance of balanced meals and low sodium choices however this is not being achieved. I made the call to [employer] to see if Faith was on the wait list for meals on wheels- I had to leave a VM and will await a phone call.   She has used [employer] before.   In the meantime- [name] has educated [name] and her husband about heart healthy diet management. I will forward to LCSW team to see if they have any other options.   [name], EMT-Paramedic [phone] 02/16/2023

## 2023-02-22 ENCOUNTER — Other Ambulatory Visit (HOSPITAL_COMMUNITY): Payer: Self-pay | Admitting: Emergency Medicine

## 2023-02-22 NOTE — Progress Notes (Signed)
Paramedicine Encounter    Patient ID: Rhonda [name], female    DOB: [birth year], 72 y.o.   MRN: [id number]   Complaints -  none.   Assessment +1 edema in right leg, lung sounds clear.   Compliance with meds - Missed 1x  lunch time dose!  Pill box filled - for 1x week   Refills needed - azelastine, citalopram, clonazepam, bentyl, levothyroxine, namenda, risperidone, torsemide  Meds changes since last visit - none    Social changes - New companionship to start this week    VISIT SUMMARY**  Met with Rhonda [name] in her home today. Pt did not have any new complaints. Pt had increased her compliance of her medicaitons this week. Pt reported that she is planning to start a companionship program to keep her company during the week. Nutrition options were offered to patient was excited about the nutrition seminar. Pt also reported that she just began receiving meals on wheels. Pt assessments as noted. Pt denied any chest pain, shortness of breath, dizziness, or palpitations. Pt's pill box was filled for 1x week, but was missing some medications, will follow up later for a med rec of same. We reviewed her upcoming appointments and updated calendar accordingly.   BP 118/68   Pulse 82   Resp 16   Wt 184 lb 1.6 oz (83.5 kg)   SpO2 99%   BMI 34.79 kg/m  Weight yesterday- DNW Last visit weight-186 lbs   [name] Setting EMT-P Community Paramedic  [phone]    ACTION: Home visit completed     Patient Care Team: Shon Hale, MD as PCP - General (Family Medicine) Burna Sis, LCSW as Social Worker (Licensed Clinical Social Worker) [name], Noberto Retort, DO as Consulting Physician (Neurology) [name], Paramedic as Paramedic Chanetta Marshall, Meridee Score, MD as Attending Physician (Family Medicine) Clinton Gallant, RN as [facility] Care Management Bensimhon, Bevelyn Buckles, MD as Consulting Physician (Cardiology) Lolly Mustache, Phillips Grout, MD as Consulting Physician  (Psychiatry) Candelaria Stagers, DPM as Consulting Physician (Podiatry)  Patient Active Problem List   Diagnosis Date Noted   Major depressive disorder, recurrent episode, severe (HCC) 07/27/2022   MDD (major depressive disorder), recurrent severe, without psychosis (HCC) 07/27/2022   MDD (major depressive disorder) 07/27/2022   Elevated troponin 12/02/2021   Nausea & vomiting 12/01/2021   Acute left ankle pain 10/11/2021   Intractable nausea and vomiting 10/08/2021   AKI (acute kidney injury) (HCC) 10/08/2021   Hyponatremia 10/08/2021   Decreased estrogen level 08/01/2021   Dementia (HCC) 08/01/2021   Frail elderly 08/01/2021   Hyperparathyroidism (HCC) 08/01/2021   Spondylolisthesis 08/01/2021   Varicose veins of bilateral lower extremities with other complications 08/01/2021   Lumbago without sciatica 04/30/2021   Abnormal gait 06/11/2020   Adult failure to thrive syndrome 06/11/2020   Allergic rhinitis 06/11/2020   Anxiety 06/11/2020   Asthma 06/11/2020   Benign intracranial hypertension 06/11/2020   Body mass index (BMI) 45.0-49.9, adult (HCC) 06/11/2020   Bowel incontinence 06/11/2020   Cholelithiasis 06/11/2020   Chronic sinusitis 06/11/2020   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   Daytime somnolence 06/11/2020   Diarrhea of presumed infectious origin 06/11/2020   Edema 06/11/2020   Family history of malignant neoplasm of gastrointestinal tract 06/11/2020   Gout 06/11/2020   History of fall 06/11/2020   History of infectious disease 06/11/2020   Insomnia 06/11/2020   Irregular bowel habits 06/11/2020   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   Lumbar spondylosis with myelopathy 06/11/2020   Macrocytosis  06/11/2020   Mild recurrent major depression (HCC) 06/11/2020   Personal history of colonic polyps 06/11/2020   Personal history of malignant neoplasm of breast 06/11/2020   Repeated falls 06/11/2020   Mixed anxiety and depressive disorder 06/11/2020   Irritable bowel syndrome 01/04/2020    Spinal stenosis of lumbar region 01/03/2020   Other symptoms and signs involving the musculoskeletal system 09/11/2019   Bilateral leg weakness 08/01/2019   Leg swelling 08/01/2019   Diabetes mellitus with neuropathy (HCC) 06/15/2019   Hypokalemia 11/08/2018   CKD (chronic kidney disease) stage 3, GFR 30-59 ml/min (HCC) 11/08/2018   Orthostatic hypotension 07/28/2017   SVD (spontaneous vaginal delivery)    Peripheral neuropathy    On home oxygen therapy    Migraines    Left bundle branch block    Hypothyroidism    Hypertension    Hyperlipidemia    Heart murmur    GERD (gastroesophageal reflux disease)    Exertional shortness of breath    Major depressive disorder    Back pain    Arthritis    Generalized anxiety disorder    Anemia    Chronic respiratory failure (HCC) 09/14/2013   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   Morbid obesity (HCC) 11/01/2012   Chronic systolic heart failure (HCC) 10/27/2012   Endometrial polyp 01/20/2012   Malignant tumor of breast (HCC) 03/26/2011   Unspecified vitamin D deficiency 03/26/2011    Current Outpatient Medications:    acetaZOLAMIDE (DIAMOX) 125 MG tablet, Take 1 tablet (125 mg total) by mouth daily., Disp: 90 tablet, Rfl: 3   Ascorbic Acid (VITAMIN C) 1000 MG tablet, Take 1,000 mg by mouth daily., Disp: , Rfl:    buPROPion (WELLBUTRIN XL) 150 MG 24 hr tablet, Take 1 tablet (150 mg total) by mouth daily., Disp: 30 tablet, Rfl: 0   carvedilol (COREG) 3.125 MG tablet, TAKE 1 TABLET (3.125 MG) BY MOUTH TWICE DAILY WITH MEALS, Disp: 180 tablet, Rfl: 3   citalopram (CELEXA) 20 MG tablet, Take 1 tablet (20 mg total) by mouth daily., Disp: 30 tablet, Rfl: 0   clonazePAM (KLONOPIN) 0.5 MG tablet, Take 1 tablet (0.5 mg total) by mouth 2 (two) times daily., Disp: 60 tablet, Rfl: 0   cyanocobalamin (VITAMIN B12) 1000 MCG tablet, Take 1,000 mcg by mouth daily., Disp: , Rfl:    desloratadine (CLARINEX) 5 MG  tablet, Take 1 tablet (5 mg total) by mouth daily., Disp: 90 tablet, Rfl: 3   dicyclomine (BENTYL) 20 MG tablet, Take 20 mg by mouth 3 (three) times daily before meals., Disp: , Rfl:    fluticasone (FLONASE) 50 MCG/ACT nasal spray, Place 1 spray into both nostrils daily., Disp: , Rfl:    gabapentin (NEURONTIN) 100 MG capsule, Take 1 capsule (100 mg total) by mouth 2 (two) times daily., Disp: 180 capsule, Rfl: 3   levothyroxine (SYNTHROID) 88 MCG tablet, TAKE 1 TABLET(88 MCG) BY MOUTH DAILY BEFORE BREAKFAST, Disp: 90 tablet, Rfl: 3   memantine (NAMENDA) 5 MG tablet, Take 1 tablet (5 mg total) by mouth daily., Disp: 30 tablet, Rfl: 3   metolazone (ZAROXOLYN) 2.5 MG tablet, TAKE 1 TABLET BY MOUTH EVERY OTHER TUESDAY., Disp: 12 tablet, Rfl: 3   midodrine (PROAMATINE) 5 MG tablet, Take 1 tablet (5 mg total) by mouth 3 (three) times daily., Disp: 200 tablet, Rfl: 3   Multiple Vitamin (MULTIVITAMIN WITH MINERALS) TABS tablet, Take 1 tablet by mouth daily., Disp: , Rfl:    Ondansetron HCl (ZOFRAN PO), 4 mg.  Take 1 tablet on the tongue and allow to dissolve, Disp: , Rfl:    potassium chloride SA (KLOR-CON M) 20 MEQ tablet, TAKE 2 TABLETS THREE TIMES DAILY. TAKE AN EXTRA 2 TABLETS ON DAYS YOU TAKE METOLAZONE, Disp: 572 tablet, Rfl: 3   risperiDONE (RISPERDAL) 0.5 MG tablet, Take 1 tablet (0.5 mg total) by mouth 2 (two) times daily at 8 am and 4 pm., Disp: 60 tablet, Rfl: 0   simvastatin (ZOCOR) 10 MG tablet, TAKE 1 TABLET EVERY EVENING, Disp: 90 tablet, Rfl: 3   spironolactone (ALDACTONE) 25 MG tablet, TAKE 1 TABLET (25 MG TOTAL) BY MOUTH EVERY EVENING., Disp: 90 tablet, Rfl: 3   torsemide (DEMADEX) 20 MG tablet, Take 4 tablets (80 mg total) by mouth 2 (two) times daily., Disp: 240 tablet, Rfl: 6   azelastine (ASTELIN) 0.1 % nasal spray, Place 2 sprays into both nostrils 2 (two) times daily. Use in each nostril as directed (Patient not taking: Reported on 02/15/2023), Disp: 30 mL, Rfl: 12 Allergies  Allergen  Reactions   Ceftin Anaphylaxis    Face and throat swell    Cefuroxime Axetil Anaphylaxis    Face and throat swell   Geodon [Ziprasidone Hcl] Hives   Lisinopril Other (See Comments)    angioedema   Cefuroxime     Other reaction(s): anaphylaxis   Sulfacetamide Sodium-Sulfur     Other reaction(s): itch   Allopurinol Nausea Only and Other (See Comments)    weakness   Ativan [Lorazepam] Itching   Sulfa Antibiotics Itching   Valium [Diazepam] Other (See Comments)    Patient states that diazepam doesn't relax, it has the opposite effect.     Social History   Socioeconomic History   Marital status: Married    Spouse name: Not on file   Number of children: 2   Years of education: 45   Highest education level: Master's degree (e.g., MA, MS, MEng, MEd, MSW, MBA)  Occupational History   Occupation: retired  Tobacco Use   Smoking status: Some Days    Current packs/day: 0.00    Average packs/day: 0.1 packs/day for 26.0 years (2.6 ttl pk-yrs)    Types: Cigarettes    Start date: 03/24/1995    Last attempt to quit: 03/23/2021    Years since quitting: 1.9   Smokeless tobacco: Never  Vaping Use   Vaping status: Never Used  Substance and Sexual Activity   Alcohol use: No   Drug use: No   Sexual activity: Yes  Other Topics Concern   Not on file  Social History Narrative   Tobacco Use Cigarettes: Former Smoker, Quit in 2008   No Alcohol   No recreational drug use   Diet: Regular/Low Carb   Exercise: None   Occupation: disabled   Education: Company secretary, masters   Children: 2   Firearms: No   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      Social Determinants of Health   Financial Resource Strain: Not on file  Food Insecurity: No Food Insecurity (08/11/2022)   Hunger Vital Sign    Worried About Running Out of Food in the Last Year: Never true    Ran Out of Food in the Last Year: Never true  Transportation Needs: No Transportation Needs  (08/11/2022)   PRAPARE - Administrator, Civil Service (Medical): No    Lack of Transportation (Non-Medical): No  Physical Activity: Not on file  Stress: No Stress Concern Present (08/11/2022)   [name] of Occupational Health - Occupational Stress Questionnaire    Feeling of Stress : Only a little  Social Connections: Not on file  Intimate Partner Violence: Not At Risk (07/27/2022)   Humiliation, Afraid, Rape, and Kick questionnaire    Fear of Current or Ex-Partner: No    Emotionally Abused: No    Physically Abused: No    Sexually Abused: No    Physical Exam      Future Appointments  Date Time Provider Department Center  03/04/2023  3:40 PM Arfeen, Phillips Grout, MD [facility] None  03/15/2023  7:10 AM [facility] DEVICE REMOTES [facility]  03/16/2023  7:00 AM [facility] DEVICE REMOTES [facility]  06/02/2023  1:45 PM Freddie Breech, DPM [facility]  06/14/2023  7:10 AM [facility] DEVICE REMOTES [facility]  06/29/2023  1:30 PM Nita Sickle K, DO [facility] None  09/13/2023  7:10 AM [facility] DEVICE REMOTES [facility]  12/22/2023  8:30 AM Rosann Auerbach, PhD [facility] None  12/22/2023  9:30 AM [facility] TECH [facility] None  12/30/2023 10:00 AM Rosann Auerbach, PhD [facility] None

## 2023-02-23 ENCOUNTER — Telehealth (HOSPITAL_COMMUNITY): Payer: Self-pay | Admitting: *Deleted

## 2023-02-23 ENCOUNTER — Telehealth (HOSPITAL_COMMUNITY): Payer: Self-pay

## 2023-02-23 NOTE — Telephone Encounter (Signed)
Called in refills for [name] this morning and the pharmacist- [name] reports he needs refills for the following prescriptions and has attempted to fax for same with no success.   Refills needed:  -Citalopram -Clonazepam -Risperidone   Please send to [facility].   Thanks!   [name], EMT-Paramedic [phone] 02/23/2023

## 2023-02-23 NOTE — Telephone Encounter (Signed)
Pt was sent a 30 day supply of requested meds: Klonopin, Celexa, and Risperdal, on 02/02/23 which would carry her to her rescheduled appointment on 03/04/23. Please review.

## 2023-02-24 ENCOUNTER — Other Ambulatory Visit (HOSPITAL_COMMUNITY): Payer: Self-pay | Admitting: Emergency Medicine

## 2023-02-24 NOTE — Progress Notes (Signed)
Met with Dali for a med rec, adding the medication she did not have on Monday.   During med rec, pt was given the flyer and information for the nutrition seminar and updated calendar. Will continue to remind her of same.   Pt was urged of the importance of attending her appointment with Dr. Florentina Jenny and calendar was updated with same. Will continue to try to remind her.   Will follow up in the home on Monday.   [name] Setting EMT-P Community Paramedic  [phone]

## 2023-03-01 ENCOUNTER — Other Ambulatory Visit (HOSPITAL_COMMUNITY): Payer: Self-pay | Admitting: Emergency Medicine

## 2023-03-01 ENCOUNTER — Telehealth (HOSPITAL_COMMUNITY): Payer: Self-pay | Admitting: Emergency Medicine

## 2023-03-01 NOTE — Progress Notes (Signed)
Paramedicine Encounter    Patient ID: Rhonda [name], female    DOB: Jul 31, 1950, 72 y.o.   MRN: [id number]   Complaints - none, denied chest pain, palpitations, shortness of breath, or dizziness.   Assessment - +2 pedal edema, lung sounds clear.   Compliance with meds - missed 1x am dose and 1 noon dose  Pill box filled - for 1x week  Refills needed - citalopram, clonazepam, bentyl, levothroxine, namenda, risperadone.   Meds changes since last visit - none    Social changes - none   VISIT SUMMARY**  Met with Rhonda [name] in the home. Rhonda [name] was in good spirits and ambulatory upon arrival. Danikah denied any complaints and reported better nutrition than last week, reporting "no wonton soup this week". Pt was noted to increase 8.5 lbs since last visit. When asked. Pt reported that she barely eats or drinks, she doesn't know how this could happen. Pt was reeducated on healthy food options, but advised that she already follows those guidelines. Upon inspection of fridge. Pt had 3x take out boxes and 2 quarts of wonton soup. Pt was reeducated on the fluid restitution including soup, but pt reports that she was unaware. Pt advised that she would do better. No call to triage due to timing of home visit, will follow up with same in the AM. Pill box filled for 1x week. Appointments reviewed and encouraged. Will follow up on Thursday due to shortage on psych meds until she makes her Thursday appointment.     BP 120/72   Pulse 90   Resp 16   Wt 192 lb 8 oz (87.3 kg)   SpO2 96%   BMI 36.37 kg/m  Weight yesterday-DNW Last visit weight-184 lbs  [name] Setting EMT-P Community Paramedic  [phone]     ACTION: Home visit completed     Patient Care Team: Shon Hale, MD as PCP - General (Family Medicine) Burna Sis, LCSW as Social Worker (Licensed Clinical Social Worker) [name], Noberto Retort, DO as Consulting Physician (Neurology) [name], Paramedic as  Paramedic Chanetta Marshall, Meridee Score, MD as Attending Physician (Family Medicine) Clinton Gallant, RN as [facility] Care Management Bensimhon, Bevelyn Buckles, MD as Consulting Physician (Cardiology) Lolly Mustache, Phillips Grout, MD as Consulting Physician (Psychiatry) Candelaria Stagers, DPM as Consulting Physician (Podiatry)  Patient Active Problem List   Diagnosis Date Noted   Major depressive disorder, recurrent episode, severe (HCC) 07/27/2022   MDD (major depressive disorder), recurrent severe, without psychosis (HCC) 07/27/2022   MDD (major depressive disorder) 07/27/2022   Elevated troponin 12/02/2021   Nausea & vomiting 12/01/2021   Acute left ankle pain 10/11/2021   Intractable nausea and vomiting 10/08/2021   AKI (acute kidney injury) (HCC) 10/08/2021   Hyponatremia 10/08/2021   Decreased estrogen level 08/01/2021   Dementia (HCC) 08/01/2021   Frail elderly 08/01/2021   Hyperparathyroidism (HCC) 08/01/2021   Spondylolisthesis 08/01/2021   Varicose veins of bilateral lower extremities with other complications 08/01/2021   Lumbago without sciatica 04/30/2021   Abnormal gait 06/11/2020   Adult failure to thrive syndrome 06/11/2020   Allergic rhinitis 06/11/2020   Anxiety 06/11/2020   Asthma 06/11/2020   Benign intracranial hypertension 06/11/2020   Body mass index (BMI) 45.0-49.9, adult (HCC) 06/11/2020   Bowel incontinence 06/11/2020   Cholelithiasis 06/11/2020   Chronic sinusitis 06/11/2020   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   Daytime somnolence 06/11/2020   Diarrhea of presumed infectious origin 06/11/2020   Edema 06/11/2020   Family history  of malignant neoplasm of gastrointestinal tract 06/11/2020   Gout 06/11/2020   History of fall 06/11/2020   History of infectious disease 06/11/2020   Insomnia 06/11/2020   Irregular bowel habits 06/11/2020   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   Lumbar spondylosis with myelopathy 06/11/2020   Macrocytosis 06/11/2020   Mild recurrent major  depression (HCC) 06/11/2020   History of colonic polyps 06/11/2020   Personal history of malignant neoplasm of breast 06/11/2020   Repeated falls 06/11/2020   Mixed anxiety and depressive disorder 06/11/2020   Irritable bowel syndrome 01/04/2020   Spinal stenosis of lumbar region 01/03/2020   Other symptoms and signs involving the musculoskeletal system 09/11/2019   Bilateral leg weakness 08/01/2019   Leg swelling 08/01/2019   Diabetes mellitus with neuropathy (HCC) 06/15/2019   Hypokalemia 11/08/2018   CKD (chronic kidney disease) stage 3, GFR 30-59 ml/min (HCC) 11/08/2018   Orthostatic hypotension 07/28/2017   SVD (spontaneous vaginal delivery)    Peripheral neuropathy    On home oxygen therapy    Migraines    Left bundle branch block    Hypothyroidism    Hypertension    Hyperlipidemia    Heart murmur    GERD (gastroesophageal reflux disease)    Exertional shortness of breath    Major depressive disorder    Back pain    Arthritis    Generalized anxiety disorder    Anemia    Chronic respiratory failure (HCC) 09/14/2013   Biventricular ICD (implantable cardioverter-defibrillator) in place 08/04/2013   Morbid obesity (HCC) 11/01/2012   Chronic systolic heart failure (HCC) 10/27/2012   Endometrial polyp 01/20/2012   Malignant tumor of breast (HCC) 03/26/2011   Vitamin D deficiency 03/26/2011    Current Outpatient Medications:    acetaZOLAMIDE (DIAMOX) 125 MG tablet, Take 1 tablet (125 mg total) by mouth daily., Disp: 90 tablet, Rfl: 3   Ascorbic Acid (VITAMIN C) 1000 MG tablet, Take 1,000 mg by mouth daily., Disp: , Rfl:    buPROPion (WELLBUTRIN XL) 150 MG 24 hr tablet, Take 1 tablet (150 mg total) by mouth daily., Disp: 30 tablet, Rfl: 0   carvedilol (COREG) 3.125 MG tablet, TAKE 1 TABLET (3.125 MG) BY MOUTH TWICE DAILY WITH MEALS, Disp: 180 tablet, Rfl: 3   citalopram (CELEXA) 20 MG tablet, Take 1 tablet (20 mg total) by mouth daily., Disp: 30 tablet, Rfl: 0   clonazePAM  (KLONOPIN) 0.5 MG tablet, Take 1 tablet (0.5 mg total) by mouth 2 (two) times daily., Disp: 60 tablet, Rfl: 0   cyanocobalamin (VITAMIN B12) 1000 MCG tablet, Take 1,000 mcg by mouth daily., Disp: , Rfl:    desloratadine (CLARINEX) 5 MG tablet, Take 1 tablet (5 mg total) by mouth daily., Disp: 90 tablet, Rfl: 3   dicyclomine (BENTYL) 20 MG tablet, Take 20 mg by mouth 3 (three) times daily before meals., Disp: , Rfl:    fluticasone (FLONASE) 50 MCG/ACT nasal spray, Place 1 spray into both nostrils daily., Disp: , Rfl:    gabapentin (NEURONTIN) 100 MG capsule, Take 1 capsule (100 mg total) by mouth 2 (two) times daily., Disp: 180 capsule, Rfl: 3   levothyroxine (SYNTHROID) 88 MCG tablet, TAKE 1 TABLET(88 MCG) BY MOUTH DAILY BEFORE BREAKFAST, Disp: 90 tablet, Rfl: 3   metolazone (ZAROXOLYN) 2.5 MG tablet, TAKE 1 TABLET BY MOUTH EVERY OTHER TUESDAY., Disp: 12 tablet, Rfl: 3   midodrine (PROAMATINE) 5 MG tablet, Take 1 tablet (5 mg total) by mouth 3 (three) times daily., Disp: 200 tablet, Rfl: 3  Multiple Vitamin (MULTIVITAMIN WITH MINERALS) TABS tablet, Take 1 tablet by mouth daily., Disp: , Rfl:    Ondansetron HCl (ZOFRAN PO), 4 mg. Take 1 tablet on the tongue and allow to dissolve, Disp: , Rfl:    potassium chloride SA (KLOR-CON M) 20 MEQ tablet, TAKE 2 TABLETS THREE TIMES DAILY. TAKE AN EXTRA 2 TABLETS ON DAYS YOU TAKE METOLAZONE, Disp: 572 tablet, Rfl: 3   risperiDONE (RISPERDAL) 0.5 MG tablet, Take 1 tablet (0.5 mg total) by mouth 2 (two) times daily at 8 am and 4 pm., Disp: 60 tablet, Rfl: 0   simvastatin (ZOCOR) 10 MG tablet, TAKE 1 TABLET EVERY EVENING, Disp: 90 tablet, Rfl: 3   spironolactone (ALDACTONE) 25 MG tablet, TAKE 1 TABLET (25 MG TOTAL) BY MOUTH EVERY EVENING., Disp: 90 tablet, Rfl: 3   torsemide (DEMADEX) 20 MG tablet, Take 4 tablets (80 mg total) by mouth 2 (two) times daily., Disp: 240 tablet, Rfl: 6   azelastine (ASTELIN) 0.1 % nasal spray, Place 2 sprays into both nostrils 2 (two)  times daily. Use in each nostril as directed (Patient not taking: Reported on 02/15/2023), Disp: 30 mL, Rfl: 12   memantine (NAMENDA) 5 MG tablet, Take 1 tablet (5 mg total) by mouth daily. (Patient not taking: Reported on 03/01/2023), Disp: 30 tablet, Rfl: 3 Allergies  Allergen Reactions   Ceftin Anaphylaxis    Face and throat swell    Cefuroxime Axetil Anaphylaxis    Face and throat swell   Geodon [Ziprasidone Hcl] Hives   Lisinopril Other (See Comments)    angioedema   Cefuroxime     Other reaction(s): anaphylaxis   Sulfacetamide Sodium-Sulfur     Other reaction(s): itch   Allopurinol Nausea Only and Other (See Comments)    weakness   Ativan [Lorazepam] Itching   Sulfa Antibiotics Itching   Valium [Diazepam] Other (See Comments)    Patient states that diazepam doesn't relax, it has the opposite effect.     Social History   Socioeconomic History   Marital status: Married    Spouse name: Not on file   Number of children: 2   Years of education: 1   Highest education level: Master's degree (e.g., MA, MS, MEng, MEd, MSW, MBA)  Occupational History   Occupation: retired  Tobacco Use   Smoking status: Some Days    Current packs/day: 0.00    Average packs/day: 0.1 packs/day for 26.0 years (2.6 ttl pk-yrs)    Types: Cigarettes    Start date: 03/24/1995    Last attempt to quit: 03/23/2021    Years since quitting: 1.9   Smokeless tobacco: Never  Vaping Use   Vaping status: Never Used  Substance and Sexual Activity   Alcohol use: No   Drug use: No   Sexual activity: Yes  Other Topics Concern   Not on file  Social History Narrative   Tobacco Use Cigarettes: Former Smoker, Quit in 2008   No Alcohol   No recreational drug use   Diet: Regular/Low Carb   Exercise: None   Occupation: disabled   Education: Company secretary, masters   Children: 2   Firearms: No   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      Social Determinants of  Health   Financial Resource Strain: Not on file  Food Insecurity: No Food Insecurity (08/11/2022)   Hunger Vital Sign    Worried About Running Out of Food in  the Last Year: Never true    Ran Out of Food in the Last Year: Never true  Transportation Needs: No Transportation Needs (08/11/2022)   PRAPARE - Administrator, Civil Service (Medical): No    Lack of Transportation (Non-Medical): No  Physical Activity: Not on file  Stress: No Stress Concern Present (08/11/2022)   [name] of Occupational Health - Occupational Stress Questionnaire    Feeling of Stress : Only a little  Social Connections: Not on file  Intimate Partner Violence: Not At Risk (07/27/2022)   Humiliation, Afraid, Rape, and Kick questionnaire    Fear of Current or Ex-Partner: No    Emotionally Abused: No    Physically Abused: No    Sexually Abused: No    Physical Exam      Future Appointments  Date Time Provider Department Center  03/04/2023  3:40 PM Arfeen, Phillips Grout, MD [facility] None  03/15/2023  7:10 AM [facility] DEVICE REMOTES [facility]  03/16/2023  7:00 AM [facility] DEVICE REMOTES [facility]  06/02/2023  1:45 PM Freddie Breech, DPM [facility]  06/14/2023  7:10 AM [facility] DEVICE REMOTES [facility]  06/29/2023  1:30 PM Nita Sickle K, DO [facility] None  09/13/2023  7:10 AM [facility] DEVICE REMOTES [facility]  12/22/2023  8:30 AM Rosann Auerbach, PhD [facility] None  12/22/2023  9:30 AM [facility] NEUROPSYCH TECH [facility] None  12/30/2023 10:00 AM Rosann Auerbach, PhD [facility] None

## 2023-03-01 NOTE — Telephone Encounter (Signed)
Met with Rhonda [name] in the home today. Rhonda [name] was in good spirits and had no complaints. It was noted that [name] gained ~ 8lbs since our last visit 1x week prior. We have been continually educating pt on good nutrition and despite her denial, her fridge has 2x containers of wonton soup and 3 take out boxes with varying options. Pt did have notable edema (+2). She has been complaint with all of her medications (-1 morning dos).   Pt denied any chest pain, shortness of breath, palpitations, or dizziness. Lung sounds were clear.   I will be out on Thursday to reevaluate and to refill psych medication that could not be refilled until her appointment.   Are there any changes you would like me to make then?   [name] Setting EMT-P Community Paramedic  [phone]

## 2023-03-02 ENCOUNTER — Other Ambulatory Visit (HOSPITAL_COMMUNITY): Payer: Self-pay | Admitting: Psychiatry

## 2023-03-02 DIAGNOSIS — F411 Generalized anxiety disorder: Secondary | ICD-10-CM

## 2023-03-02 DIAGNOSIS — F330 Major depressive disorder, recurrent, mild: Secondary | ICD-10-CM

## 2023-03-03 ENCOUNTER — Telehealth (HOSPITAL_COMMUNITY): Payer: Self-pay | Admitting: Emergency Medicine

## 2023-03-03 NOTE — Telephone Encounter (Signed)
Pts husband is unsure if this is a  metolazone week. Will confirm with paramed

## 2023-03-03 NOTE — Telephone Encounter (Signed)
Called pt to remind her of appointment tomorrow. Pt was unaware despite multiple reminders. Pt's husband was sent a reminder text, as he was asleep when I called. Text included location, time and urgency of visit. Will follow up tomorrow.   [name] Setting EMT-P Community Paramedic  [phone]

## 2023-03-04 ENCOUNTER — Ambulatory Visit (HOSPITAL_COMMUNITY): Payer: Medicare HMO | Admitting: Psychiatry

## 2023-03-04 ENCOUNTER — Encounter (HOSPITAL_COMMUNITY): Payer: Self-pay | Admitting: Psychiatry

## 2023-03-04 DIAGNOSIS — R4189 Other symptoms and signs involving cognitive functions and awareness: Secondary | ICD-10-CM

## 2023-03-04 DIAGNOSIS — F411 Generalized anxiety disorder: Secondary | ICD-10-CM | POA: Diagnosis not present

## 2023-03-04 DIAGNOSIS — E1142 Type 2 diabetes mellitus with diabetic polyneuropathy: Secondary | ICD-10-CM | POA: Diagnosis not present

## 2023-03-04 DIAGNOSIS — Z23 Encounter for immunization: Secondary | ICD-10-CM | POA: Diagnosis not present

## 2023-03-04 DIAGNOSIS — G629 Polyneuropathy, unspecified: Secondary | ICD-10-CM | POA: Diagnosis not present

## 2023-03-04 DIAGNOSIS — I5032 Chronic diastolic (congestive) heart failure: Secondary | ICD-10-CM | POA: Diagnosis not present

## 2023-03-04 DIAGNOSIS — F330 Major depressive disorder, recurrent, mild: Secondary | ICD-10-CM

## 2023-03-04 DIAGNOSIS — E1140 Type 2 diabetes mellitus with diabetic neuropathy, unspecified: Secondary | ICD-10-CM | POA: Diagnosis not present

## 2023-03-04 MED ORDER — RISPERIDONE 0.5 MG PO TABS: 0.5000 mg | ORAL_TABLET | ORAL | 2 refills | Status: DC

## 2023-03-04 MED ORDER — BUPROPION HCL ER (XL) 150 MG PO TB24: 150.0000 mg | ORAL_TABLET | Freq: Every day | ORAL | 2 refills | Status: DC

## 2023-03-04 MED ORDER — CITALOPRAM HYDROBROMIDE 20 MG PO TABS
20.0000 mg | ORAL_TABLET | Freq: Every day | ORAL | 2 refills | Status: DC
Start: 2023-03-04 — End: 2023-06-10

## 2023-03-04 MED ORDER — CLONAZEPAM 0.5 MG PO TABS
0.5000 mg | ORAL_TABLET | Freq: Two times a day (BID) | ORAL | 2 refills | Status: DC
Start: 2023-03-04 — End: 2023-06-10

## 2023-03-04 NOTE — Progress Notes (Signed)
BH MD/PA/NP OP Progress Note  Patient location; office Provider location; office  03/04/2023 3:32 PM GLYNN FREAS  MRN:  [id number]  Chief Complaint:  Chief Complaint  Patient presents with   Follow-up   HPI:  Patient came in today for her follow-up appointment with her husband.  She reported nothing much changed and she still takes naps during the day or watch TV.  She understands she need to be more active but has not started going to church or any activities.  Her husband bought her a book so she can read but patient has not started yet.  Husband reported overall her mood has most of the time stable there are times when she gets irritable frustrated and usually get upset with the husband.  Patient lives by herself.  Husband works and comes late.  Patient feels disappointed that daughter do not talk to them as frequently.  Patient denies any hallucination, paranoia, aggression, violence.  She has memory issues which has declined slowly and gradually.  She recently saw her kidney doctor.  Her kidney functions are high but stable.  Her appetite is okay.  She admitted few pounds weight gain which could be due to lack of activity.  Patient has multiple health issues including renal insufficiency, CHF, neuropathy, headaches, memory impairment and hypertension.  Her primary care is Dr. Chanetta Marshall at [facility] physician.  She admitted her basic problem is boredom.  She had promised that she will start going to church but her husband reported she do not wake up earlier so she had missed church.  Patient does talk to her 2 close friends on the phone.  Visit Diagnosis:    ICD-10-CM   1. Major depressive disorder, recurrent episode, mild (HCC)  F33.0 buPROPion (WELLBUTRIN XL) 150 MG 24 hr tablet    citalopram (CELEXA) 20 MG tablet    clonazePAM (KLONOPIN) 0.5 MG tablet    risperiDONE (RISPERDAL) 0.5 MG tablet    2. Cognitive decline  R41.89 buPROPion (WELLBUTRIN XL) 150 MG 24 hr tablet    3.  Generalized anxiety disorder  F41.1 citalopram (CELEXA) 20 MG tablet    clonazePAM (KLONOPIN) 0.5 MG tablet       Past Psychiatric History: Reviewed. History of depression, anxiety and seen by Dr. Clyde Lundborg for a few years but do not remember the details. History of inpatient in March 2024 due to severe psychotic and aggressive behavior. Patient was discharged on trazodone, Seroquel, risperidone, Celexa, Wellbutrin, Klonopin and Wellbutrin. She was also given olanzapine, Haldol for agitation. No history of suicidal attempt.   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    Complication of anesthesia    History of low blood pressure after surgery; attributed to lying flat   Diabetes mellitus    "diet controlled" (05/03/2013)   Exertional shortness of breath    Generalized anxiety disorder    GERD (gastroesophageal reflux disease)  Gout    Heart murmur    Hyperlipidemia    Hypertension    Hypokalemia 11/08/2018   Hypothyroidism    Left bundle branch block    s/p CRT-D (04/2013)   Major depressive disorder    Migraines    On home oxygen therapy    "2L suppose to be q night" (05/03/2013)   Orthostatic hypotension 07/28/2017   Peripheral neuropathy    Feet   SVD (spontaneous vaginal delivery)    x 2   Unspecified vitamin D deficiency 03/26/2011   Does not take meds    Past Surgical History:  Procedure Laterality Date   BI-VENTRICULAR IMPLANTABLE CARDIOVERTER DEFIBRILLATOR N/A 05/03/2013    Procedure: BI-VENTRICULAR IMPLANTABLE CARDIOVERTER DEFIBRILLATOR  (CRT-D);  Surgeon: Marinus Maw, MD;  Location: [facility] CATH LAB;  Service: Cardiovascular;  Laterality: N/A;   BI-VENTRICULAR IMPLANTABLE CARDIOVERTER DEFIBRILLATOR  (CRT-D)  05/03/2013   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   BIOPSY  05/31/2018   Procedure: BIOPSY;  Surgeon: Kerin Salen, MD;  Location: [facility] ENDOSCOPY;  Service: Gastroenterology;;   BIV ICD GENERATOR CHANGEOUT N/A 04/11/2020   Procedure: BIV ICD GENERATOR CHANGEOUT;  Surgeon: Marinus Maw, MD;  Location: [facility] INVASIVE CV LAB;  Service: Cardiovascular;  Laterality: N/A;   BREAST LUMPECTOMY Left 07/28/2010   CARDIAC CATHETERIZATION  2010   NORMAL CORONARY ARTERIES   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--11 SURGERIES     PT HAS REMOVABLE SPEECH BULB-TAKES IT OUT BEFORE HER SURGERY   COLONOSCOPY     COLONOSCOPY N/A 05/31/2018   Procedure: COLONOSCOPY;  Surgeon: Kerin Salen, MD;  Location: [facility] ENDOSCOPY;  Service: Gastroenterology;  Laterality: N/A;   HYSTEROSCOPY WITH D & C  01/20/2012   Procedure: DILATATION AND CURETTAGE /HYSTEROSCOPY;  Surgeon: Meriel Pica, MD;  Location: [facility] ORS;  Service: Gynecology;  Laterality: N/A;  with trueclear   PORT-A-CATH REMOVAL  09/23/2011   Procedure: REMOVAL PORT-A-CATH;  Surgeon: Emelia Loron, MD;  Location: [facility] ORS;  Service: General;  Laterality: N/A;  Port Removal   PORTACATH PLACEMENT  2012   TONSILLECTOMY  1960's   TOTAL KNEE ARTHROPLASTY  05/10/2012   Procedure: TOTAL KNEE ARTHROPLASTY;  Surgeon: Shelda Pal, MD;  Location: [facility] ORS;  Service: Orthopedics;  Laterality: Right;   WISDOM TOOTH EXTRACTION      Family Psychiatric History: Reviewed.  Family History:  Family History  Problem Relation Age of Onset   Alzheimer's disease Mother    CVA Mother    Hypertension Mother    Cancer - Colon Father        late 39   Birth defects Paternal Uncle     Social History:  Social History    Socioeconomic History   Marital status: Married    Spouse name: Not on file   Number of children: 2   Years of education: 18   Highest education level: Manufacturing engineer (e.g., MA, MS, MEng, MEd, MSW, MBA)  Occupational History   Occupation: retired  Tobacco Use   Smoking status: Some Days    Current packs/day: 0.00    Average packs/day: 0.1 packs/day for 26.0 years (2.6 ttl pk-yrs)    Types: Cigarettes    Start date: 03/24/1995    Last attempt to quit: 03/23/2021    Years since quitting: 1.9   Smokeless tobacco: Never  Vaping Use   Vaping status: Never Used  Substance and Sexual Activity   Alcohol use: No   Drug use: No  Sexual activity: Yes  Other Topics Concern   Not on file  Social History Narrative   Tobacco Use Cigarettes: Former Smoker, Quit in 2008   No Alcohol   No recreational drug use   Diet: Regular/Low Carb   Exercise: None   Occupation: disabled   Education: Company secretary, masters   Children: 2   Firearms: No   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      Social Determinants of Health   Financial Resource Strain: Not on file  Food Insecurity: No Food Insecurity (08/11/2022)   Hunger Vital Sign    Worried About Running Out of Food in the Last Year: Never true    Ran Out of Food in the Last Year: Never true  Transportation Needs: No Transportation Needs (08/11/2022)   PRAPARE - Administrator, Civil Service (Medical): No    Lack of Transportation (Non-Medical): No  Physical Activity: Not on file  Stress: No Stress Concern Present (08/11/2022)   [name] of Occupational Health - Occupational Stress Questionnaire    Feeling of Stress : Only a little  Social Connections: Not on file    Allergies:  Allergies  Allergen Reactions   Ceftin Anaphylaxis    Face and throat swell    Cefuroxime Axetil Anaphylaxis    Face and throat swell   Geodon [Ziprasidone Hcl] Hives   Lisinopril Other (See  Comments)    angioedema   Cefuroxime     Other reaction(s): anaphylaxis   Sulfacetamide Sodium-Sulfur     Other reaction(s): itch   Allopurinol Nausea Only and Other (See Comments)    weakness   Ativan [Lorazepam] Itching   Sulfa Antibiotics Itching   Valium [Diazepam] Other (See Comments)    Patient states that diazepam doesn't relax, it has the opposite effect.    Metabolic Disorder Labs: Lab Results  Component Value Date   HGBA1C 7.0 (H) 10/09/2021   MPG 154.2 10/09/2021   MPG 151.33 10/08/2021   No results found for: "PROLACTIN" Lab Results  Component Value Date   CHOL 133 07/31/2022   TRIG 67 07/31/2022   HDL 51 07/31/2022   CHOLHDL 2.6 07/31/2022   VLDL 13 07/31/2022   LDLCALC 69 07/31/2022   LDLCALC  08/05/2009    92        Total Cholesterol/HDL:CHD Risk Coronary Heart Disease Risk Table                     Men   Women  1/2 Average Risk   3.4   3.3  Average Risk       5.0   4.4  2 X Average Risk   9.6   7.1  3 X Average Risk  23.4   11.0        Use the calculated Patient Ratio above and the CHD Risk Table to determine the patient's CHD Risk.        ATP III CLASSIFICATION (LDL):  <100     mg/dL   Optimal  161-096  mg/dL   Near or Above                    Optimal  130-159  mg/dL   Borderline  045-409  mg/dL   High  >811     mg/dL   Very High   Lab Results  Component Value Date   TSH 2.398 07/26/2022  TSH 1.127 10/08/2021    Therapeutic Level Labs: No results found for: "LITHIUM" No results found for: "VALPROATE" No results found for: "CBMZ"  Current Medications: Current Outpatient Medications  Medication Sig Dispense Refill   acetaZOLAMIDE (DIAMOX) 125 MG tablet Take 1 tablet (125 mg total) by mouth daily. 90 tablet 3   Ascorbic Acid (VITAMIN C) 1000 MG tablet Take 1,000 mg by mouth daily.     azelastine (ASTELIN) 0.1 % nasal spray Place 2 sprays into both nostrils 2 (two) times daily. Use in each nostril as directed (Patient not taking:  Reported on 02/15/2023) 30 mL 12   buPROPion (WELLBUTRIN XL) 150 MG 24 hr tablet Take 1 tablet (150 mg total) by mouth daily. 30 tablet 0   carvedilol (COREG) 3.125 MG tablet TAKE 1 TABLET (3.125 MG) BY MOUTH TWICE DAILY WITH MEALS 180 tablet 3   citalopram (CELEXA) 20 MG tablet Take 1 tablet (20 mg total) by mouth daily. 30 tablet 0   clonazePAM (KLONOPIN) 0.5 MG tablet Take 1 tablet (0.5 mg total) by mouth 2 (two) times daily. 60 tablet 0   cyanocobalamin (VITAMIN B12) 1000 MCG tablet Take 1,000 mcg by mouth daily.     desloratadine (CLARINEX) 5 MG tablet Take 1 tablet (5 mg total) by mouth daily. 90 tablet 3   dicyclomine (BENTYL) 20 MG tablet Take 20 mg by mouth 3 (three) times daily before meals.     fluticasone (FLONASE) 50 MCG/ACT nasal spray Place 1 spray into both nostrils daily.     gabapentin (NEURONTIN) 100 MG capsule Take 1 capsule (100 mg total) by mouth 2 (two) times daily. 180 capsule 3   levothyroxine (SYNTHROID) 88 MCG tablet TAKE 1 TABLET(88 MCG) BY MOUTH DAILY BEFORE BREAKFAST 90 tablet 3   memantine (NAMENDA) 5 MG tablet Take 1 tablet (5 mg total) by mouth daily. (Patient not taking: Reported on 03/01/2023) 30 tablet 3   metolazone (ZAROXOLYN) 2.5 MG tablet TAKE 1 TABLET BY MOUTH EVERY OTHER TUESDAY. 12 tablet 3   midodrine (PROAMATINE) 5 MG tablet Take 1 tablet (5 mg total) by mouth 3 (three) times daily. 200 tablet 3   Multiple Vitamin (MULTIVITAMIN WITH MINERALS) TABS tablet Take 1 tablet by mouth daily.     Ondansetron HCl (ZOFRAN PO) 4 mg. Take 1 tablet on the tongue and allow to dissolve     potassium chloride SA (KLOR-CON M) 20 MEQ tablet TAKE 2 TABLETS THREE TIMES DAILY. TAKE AN EXTRA 2 TABLETS ON DAYS YOU TAKE METOLAZONE 572 tablet 3   risperiDONE (RISPERDAL) 0.5 MG tablet Take 1 tablet (0.5 mg total) by mouth 2 (two) times daily at 8 am and 4 pm. 60 tablet 0   simvastatin (ZOCOR) 10 MG tablet TAKE 1 TABLET EVERY EVENING 90 tablet 3   spironolactone (ALDACTONE) 25 MG  tablet TAKE 1 TABLET (25 MG TOTAL) BY MOUTH EVERY EVENING. 90 tablet 3   torsemide (DEMADEX) 20 MG tablet Take 4 tablets (80 mg total) by mouth 2 (two) times daily. 240 tablet 6   No current facility-administered medications for this visit.     Musculoskeletal: Strength & Muscle Tone: decreased Gait & Station: unsteady, uses cain Patient leans:  see above  Psychiatric Specialty Exam: Review of Systems  Psychiatric/Behavioral:  Positive for decreased concentration and dysphoric mood.     Blood pressure 119/68, pulse 72, height 5' 1.5" (1.562 m), weight 196 lb (88.9 kg).There is no height or weight on file to calculate BMI.  General Appearance: Casual  Eye Contact:  Fair  Speech:  Slow  Volume:  Decreased  Mood:  Euthymic  Affect:  Congruent  Thought Process:  Descriptions of Associations: Intact  Orientation:  Full (Time, Place, and Person)  Thought Content: Rumination   Suicidal Thoughts:  No  Homicidal Thoughts:  No  Memory:  Immediate;   Fair Recent;   Fair Remote;   Fair  Judgement:  Fair  Insight:  Fair  Psychomotor Activity:  Decreased  Concentration:  Concentration: Fair and Attention Span: Fair  Recall:  [employer] of Knowledge: Fair  Language: Good  Akathisia:  No  Handed:  Right  AIMS (if indicated): not done  Assets:  Communication Skills Desire for Improvement Housing Social Support  ADL's:  Intact  Cognition: Impaired,  Mild  Sleep:  Fair   Screenings: AUDIT    Flowsheet Row Admission (Discharged) from 07/27/2022 in [facility] BE[facility]IORAL [facility]ICINE  Alcohol Use Disorder Identification Test Final Score (AUDIT) 0      PHQ2-9    Flowsheet Row Care Coordination from 08/11/2022 in [facility] Community Care Coordination  PHQ-2 Total Score 1      Flowsheet Row ED from 09/03/2022 in [facility] at [facility]) Admission (Discharged) from 07/27/2022 in [facility] BEHAVIORAL MEDICINE ED from 07/26/2022 in [facility] Emergency Department at [facility]  C-SSRS RISK CATEGORY No Risk No Risk No Risk        Assessment and Plan: I reviewed previous notes, current medication, blood work results.  Creatinine high but stable.  Discussed boredom, chronic symptoms and recommend to engage herself in activities.  Encouraged to read the book that her husband recently bought for her.  Recommend start going to church activities.  We have cut down medication since she had discharged from the hospital earlier this year.  She does not want to cut down further but promised that she will make herself more active doing things.  She has no major concern from the medication.  She has no tremors.  Her violence and aggression is much better.  We will continue Klonopin 0.5 mg twice a day, Wellbutrin XL 150 mg daily, Celexa 20 mg daily and Risperdal 0.5 mg twice a day.  We will consider lowering the dose further in the future as indicated.  Recommend to call us back if she has any question or any concern.  Follow-up in 3 months.   Collaboration of Care: Collaboration of Care: Other provider involved in patient's care AEB notes are available in epic to review.  Patient/Guardian was advised Release of Information must be obtained prior to any record release in order to collaborate their care with an outside provider. Patient/Guardian was advised if they have not already done so to contact the registration department to sign all necessary forms in order for [place] to release information regarding their care.   Consent: Patient/Guardian gives verbal consent for treatment and assignment of benefits for services provided during this visit. Patient/Guardian expressed understanding and agreed to proceed.   I provided 26 minutes of face-to-face time to the patient during this encounter  Cleotis Nipper, MD 03/04/2023, 3:33 PM

## 2023-03-08 ENCOUNTER — Other Ambulatory Visit (HOSPITAL_COMMUNITY): Payer: Self-pay | Admitting: Emergency Medicine

## 2023-03-08 ENCOUNTER — Ambulatory Visit: Payer: Medicare HMO | Attending: Internal Medicine

## 2023-03-08 DIAGNOSIS — I5022 Chronic systolic (congestive) heart failure: Secondary | ICD-10-CM

## 2023-03-08 DIAGNOSIS — Z95810 Presence of automatic (implantable) cardiac defibrillator: Secondary | ICD-10-CM

## 2023-03-08 NOTE — Progress Notes (Unsigned)
EPIC Encounter for ICM Monitoring  Patient Name: Rhonda [name] is a 72 y.o. female Date: 03/08/2023 Primary Care Physican: Shon Hale, MD Primary Cardiologist: Bensimhon Electrophysiologist: Rae Roam Pacing: 98.9%   06/01/2022 Weight: 193 lbs  07/08/2022 Weight: 183 lbs 08/10/2022 Weight: 185 lbs 11/25/2022 Weight: 180 lbs 12/30/2022 Weight: 184 lbs          Spoke with husband and heart failure questions reviewed.  Transmission results reviewed.  Pt asymptomatic for fluid accumulation.  Husband unsure if Metolazone is due tomorrow, 10/15.  He reports [name] Setting with [name] is assisting with meds.  Advised will send message to [name] to determine next Metolazone dosage.                 Optivol thoracic impedance suggesting possible fluid accumulation starting 10/1.     Prescribed:  [name] Setting with paramedicine program assists with meds. Torsemide 20 mg 4 tablets (80 mg total) by mouth twice a day  Potassium 20 meq 2 tablets (40 mEq total) by mouth 3 (three) times daily.  Take extra 2 tablets on days you take Metolazone.   Metolazone 2.5mg  1 tablet every other Tuesday.  (next dosage due 03/09/23) Spironolactone 25 mg take 1 tablet by mouth every evening.   Labs: 01/13/2023 Creatinine 1.62, BUN 28, Potassium 3.5, Sodium 134, GFR 34 12/30/2022 Creatinine 1.64, BUN 31, Potassium 3.6, Sodium 134, GFR 33 12/02/2022 Creatinine 1.68 BUN 23,  Potassium 4.0, Sodium 136, GFR 33 08/01/2022 Creatinine 1.47, BUN 42, Potassium 3.1, Sodium 136, GFR 38  07/26/2022 Creatinine 2.02, BUN 25, Potassium 4.6, Sodium 133, GFR 26  07/22/2022 Creatinine 1.57, BUN 20, Potassium 3.8, Sodium 136, GFR 35  07/14/2022 Creatinine 1.32, BUN 20, Potassium 4.2, Sodium 138, GFR 44 06/25/2022 Creatinine 2.19, BUN 29, Potassium 3.5, Sodium 134, GFR 24 A complete set of results can be found in Results Review.   Recommendations:   Will send copy to Prince Rome, NP at [facility] clinic for review and  recommendations.   Follow-up plan: ICM clinic phone appointment on 03/16/2023 for 31 day and recheck fluid levels.   91 day device clinic remote transmission 03/15/2023.     EP/Cardiology Office Visits:  Recall 05/29/2023 with Dr Gala Romney.  Recall 03/20/2023 with Dr Ladona Ridgel.     Copy of ICM check sent to Dr. Ladona Ridgel.   3 month ICM trend: 03/08/2023.    12-14 Month ICM trend:     Karie Soda, RN 03/08/2023 4:32 PM

## 2023-03-08 NOTE — Progress Notes (Signed)
Paramedicine Encounter    Patient ID: Rhonda [name], female    DOB: February 01, 1951, 72 y.o.   MRN: [id number]   Complaints - sore throat  Assessment -  +1 edema at mid shin, lung sounds clear. Asymptomatic,   Compliance with meds - Missed 2x noon doses    Pill box filled - for 1x week    Refills needed - carvedilol   Meds changes since last visit - none    Social changes - none   VISIT SUMMARY**  Met with [name] in the home today. Pt reports soreness in her throat, but advised that she has been taking OTC medication for same and has not been compliant with her nasal spray regiment. Pt denied any shortness of breath, chest pain, palpitations, or dizziness. Husband reports receiving a report from the device clinic stating that [name]'s fluid was up. Today 10/14, was her metolazone dose (she is prescribed every other Monday metolazone doses). Pt presented with substantually less fluid than last week, lung sounds continue to be clear. Pt able to ambulate around her home with out any exertional shortness of breath. Only 1x lb weight gain since last week noted. Will follow up with [facility] and device clinic reference fluid. Pt's pill box was filled for 1x week, will follow up in 1x week.   BP 134/80   Pulse 86   Wt 193 lb 4.8 oz (87.7 kg)   BMI 35.93 kg/m  Weight yesterday-DNW Last visit weight-192 lbs  [name] Setting EMT-P Community Paramedic  [phone]     ACTION: Home visit completed     Patient Care Team: Shon Hale, MD as PCP - General (Family Medicine) Burna Sis, LCSW as Social Worker (Licensed Clinical Social Worker) [name], Noberto Retort, DO as Consulting Physician (Neurology) [name], Paramedic as Paramedic Chanetta Marshall, Meridee Score, MD as Attending Physician (Family Medicine) Clinton Gallant, RN as [facility] Care Management Bensimhon, Bevelyn Buckles, MD as Consulting Physician (Cardiology) Lolly Mustache Phillips Grout, MD as Consulting Physician  (Psychiatry) Candelaria Stagers, DPM as Consulting Physician (Podiatry)  Patient Active Problem List   Diagnosis Date Noted   Major depressive disorder, recurrent episode, severe (HCC) 07/27/2022   MDD (major depressive disorder), recurrent severe, without psychosis (HCC) 07/27/2022   MDD (major depressive disorder) 07/27/2022   Elevated troponin 12/02/2021   Nausea & vomiting 12/01/2021   Acute left ankle pain 10/11/2021   Intractable nausea and vomiting 10/08/2021   AKI (acute kidney injury) (HCC) 10/08/2021   Hyponatremia 10/08/2021   Decreased estrogen level 08/01/2021   Dementia (HCC) 08/01/2021   Frail elderly 08/01/2021   Hyperparathyroidism (HCC) 08/01/2021   Spondylolisthesis 08/01/2021   Varicose veins of bilateral lower extremities with other complications 08/01/2021   Lumbago without sciatica 04/30/2021   Abnormal gait 06/11/2020   Adult failure to thrive syndrome 06/11/2020   Allergic rhinitis 06/11/2020   Anxiety 06/11/2020   Asthma 06/11/2020   Benign intracranial hypertension 06/11/2020   Body mass index (BMI) 45.0-49.9, adult (HCC) 06/11/2020   Bowel incontinence 06/11/2020   Cholelithiasis 06/11/2020   Chronic sinusitis 06/11/2020   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   Daytime somnolence 06/11/2020   Diarrhea of presumed infectious origin 06/11/2020   Edema 06/11/2020   Family history of malignant neoplasm of gastrointestinal tract 06/11/2020   Gout 06/11/2020   History of fall 06/11/2020   History of infectious disease 06/11/2020   Insomnia 06/11/2020   Irregular bowel habits 06/11/2020   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   Lumbar spondylosis with  myelopathy 06/11/2020   Macrocytosis 06/11/2020   Mild recurrent major depression (HCC) 06/11/2020   History of colonic polyps 06/11/2020   Personal history of malignant neoplasm of breast 06/11/2020   Repeated falls 06/11/2020   Mixed anxiety and depressive disorder 06/11/2020   Irritable bowel syndrome 01/04/2020   Spinal  stenosis of lumbar region 01/03/2020   Other symptoms and signs involving the musculoskeletal system 09/11/2019   Bilateral leg weakness 08/01/2019   Leg swelling 08/01/2019   Diabetes mellitus with neuropathy (HCC) 06/15/2019   Hypokalemia 11/08/2018   CKD (chronic kidney disease) stage 3, GFR 30-59 ml/min (HCC) 11/08/2018   Orthostatic hypotension 07/28/2017   SVD (spontaneous vaginal delivery)    Peripheral neuropathy    On home oxygen therapy    Migraines    Left bundle branch block    Hypothyroidism    Hypertension    Hyperlipidemia    Heart murmur    GERD (gastroesophageal reflux disease)    Exertional shortness of breath    Major depressive disorder    Back pain    Arthritis    Generalized anxiety disorder    Anemia    Chronic respiratory failure (HCC) 09/14/2013   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/04/2013   Morbid obesity (HCC) 11/01/2012   Chronic systolic heart failure (HCC) 10/27/2012   Endometrial polyp 01/20/2012   Malignant tumor of breast (HCC) 03/26/2011   Vitamin D deficiency 03/26/2011    Current Outpatient Medications:    acetaZOLAMIDE (DIAMOX) 125 MG tablet, Take 1 tablet (125 mg total) by mouth daily., Disp: 90 tablet, Rfl: 3   Ascorbic Acid (VITAMIN C) 1000 MG tablet, Take 1,000 mg by mouth daily., Disp: , Rfl:    buPROPion (WELLBUTRIN XL) 150 MG 24 hr tablet, Take 1 tablet (150 mg total) by mouth daily., Disp: 30 tablet, Rfl: 2   carvedilol (COREG) 3.125 MG tablet, TAKE 1 TABLET (3.125 MG) BY MOUTH TWICE DAILY WITH MEALS, Disp: 180 tablet, Rfl: 3   citalopram (CELEXA) 20 MG tablet, Take 1 tablet (20 mg total) by mouth daily., Disp: 30 tablet, Rfl: 2   clonazePAM (KLONOPIN) 0.5 MG tablet, Take 1 tablet (0.5 mg total) by mouth 2 (two) times daily., Disp: 60 tablet, Rfl: 2   cyanocobalamin (VITAMIN B12) 1000 MCG tablet, Take 1,000 mcg by mouth daily., Disp: , Rfl:    desloratadine (CLARINEX) 5 MG tablet, Take 1 tablet  (5 mg total) by mouth daily., Disp: 90 tablet, Rfl: 3   dicyclomine (BENTYL) 20 MG tablet, Take 20 mg by mouth 3 (three) times daily before meals., Disp: , Rfl:    fluticasone (FLONASE) 50 MCG/ACT nasal spray, Place 1 spray into both nostrils daily., Disp: , Rfl:    gabapentin (NEURONTIN) 100 MG capsule, Take 1 capsule (100 mg total) by mouth 2 (two) times daily., Disp: 180 capsule, Rfl: 3   levothyroxine (SYNTHROID) 88 MCG tablet, TAKE 1 TABLET(88 MCG) BY MOUTH DAILY BEFORE BREAKFAST, Disp: 90 tablet, Rfl: 3   metolazone (ZAROXOLYN) 2.5 MG tablet, TAKE 1 TABLET BY MOUTH EVERY OTHER TUESDAY., Disp: 12 tablet, Rfl: 3   midodrine (PROAMATINE) 5 MG tablet, Take 1 tablet (5 mg total) by mouth 3 (three) times daily., Disp: 200 tablet, Rfl: 3   Multiple Vitamin (MULTIVITAMIN WITH MINERALS) TABS tablet, Take 1 tablet by mouth daily., Disp: , Rfl:    Ondansetron HCl (ZOFRAN PO), 4 mg. Take 1 tablet on the tongue and allow to dissolve, Disp: , Rfl:    potassium chloride  SA (KLOR-CON M) 20 MEQ tablet, TAKE 2 TABLETS THREE TIMES DAILY. TAKE AN EXTRA 2 TABLETS ON DAYS YOU TAKE METOLAZONE, Disp: 572 tablet, Rfl: 3   risperiDONE (RISPERDAL) 0.5 MG tablet, Take 1 tablet (0.5 mg total) by mouth 2 (two) times daily at 8 am and 4 pm., Disp: 60 tablet, Rfl: 2   simvastatin (ZOCOR) 10 MG tablet, TAKE 1 TABLET EVERY EVENING, Disp: 90 tablet, Rfl: 3   spironolactone (ALDACTONE) 25 MG tablet, TAKE 1 TABLET (25 MG TOTAL) BY MOUTH EVERY EVENING., Disp: 90 tablet, Rfl: 3   torsemide (DEMADEX) 20 MG tablet, Take 4 tablets (80 mg total) by mouth 2 (two) times daily., Disp: 240 tablet, Rfl: 6   azelastine (ASTELIN) 0.1 % nasal spray, Place 2 sprays into both nostrils 2 (two) times daily. Use in each nostril as directed (Patient not taking: Reported on 02/15/2023), Disp: 30 mL, Rfl: 12   memantine (NAMENDA) 5 MG tablet, Take 1 tablet (5 mg total) by mouth daily. (Patient not taking: Reported on 03/01/2023), Disp: 30 tablet, Rfl:  3 Allergies  Allergen Reactions   Ceftin Anaphylaxis    Face and throat swell    Cefuroxime Axetil Anaphylaxis    Face and throat swell   Geodon [Ziprasidone Hcl] Hives   Lisinopril Other (See Comments)    angioedema   Cefuroxime     Other reaction(s): anaphylaxis   Sulfacetamide Sodium-Sulfur     Other reaction(s): itch   Allopurinol Nausea Only and Other (See Comments)    weakness   Ativan [Lorazepam] Itching   Sulfa Antibiotics Itching   Valium [Diazepam] Other (See Comments)    Patient states that diazepam doesn't relax, it has the opposite effect.     Social History   Socioeconomic History   Marital status: Married    Spouse name: Not on file   Number of children: 2   Years of education: 33   Highest education level: Master's degree (e.g., MA, MS, MEng, MEd, MSW, MBA)  Occupational History   Occupation: retired  Tobacco Use   Smoking status: Some Days    Current packs/day: 0.00    Average packs/day: 0.1 packs/day for 26.0 years (2.6 ttl pk-yrs)    Types: Cigarettes    Start date: 03/24/1995    Last attempt to quit: 03/23/2021    Years since quitting: 1.9   Smokeless tobacco: Never  Vaping Use   Vaping status: Never Used  Substance and Sexual Activity   Alcohol use: No   Drug use: No   Sexual activity: Yes  Other Topics Concern   Not on file  Social History Narrative   Tobacco Use Cigarettes: Former Smoker, Quit in 2008   No Alcohol   No recreational drug use   Diet: Regular/Low Carb   Exercise: None   Occupation: disabled   Education: Company secretary, masters   Children: 2   Firearms: No   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      Social Determinants of Health   Financial Resource Strain: Not on file  Food Insecurity: No Food Insecurity (08/11/2022)   Hunger Vital Sign    Worried About Running Out of Food in the Last Year: Never true    Ran Out of Food in the Last Year: Never true  Transportation Needs:  No Transportation Needs (08/11/2022)   PRAPARE - Transportation    Lack of Transportation (Medical): No    Lack of  Transportation (Non-Medical): No  Physical Activity: Not on file  Stress: No Stress Concern Present (08/11/2022)   [name] of Occupational Health - Occupational Stress Questionnaire    Feeling of Stress : Only a little  Social Connections: Not on file  Intimate Partner Violence: Not At Risk (07/27/2022)   Humiliation, Afraid, Rape, and Kick questionnaire    Fear of Current or Ex-Partner: No    Emotionally Abused: No    Physically Abused: No    Sexually Abused: No    Physical Exam      Future Appointments  Date Time Provider Department Center  03/15/2023  7:10 AM [facility] DEVICE REMOTES [facility]  03/16/2023  7:00 AM [facility] DEVICE REMOTES [facility]  06/02/2023  1:45 PM Freddie Breech, DPM [facility]  06/10/2023  3:20 PM Arfeen, Phillips Grout, MD [facility] None  06/14/2023  7:10 AM [facility] DEVICE REMOTES [facility]  06/29/2023  1:30 PM Nita Sickle K, DO [facility] None  09/13/2023  7:10 AM [facility] DEVICE REMOTES [facility]  12/22/2023  8:30 AM Rosann Auerbach, PhD [facility] None  12/22/2023  9:30 AM [facility] NEUR[facility]SY[facility] TECH [facility] None  12/30/2023 10:00 AM Rosann Auerbach, PhD [facility] None

## 2023-03-09 ENCOUNTER — Other Ambulatory Visit (HOSPITAL_COMMUNITY): Payer: Self-pay | Admitting: Emergency Medicine

## 2023-03-09 NOTE — Progress Notes (Signed)
Spoke with husband and advised Shanda Bumps from HF clinic recommended patient take a 2nd Metolazone tablet with 40 mEq of Potassium this week.  Advised [name] Setting is aware of change and will coordinate a time with him to come to the house and add the additional Metolazone and potassium dosage.  Advised to monitor weight closely since patient has gained approximately 9 lbs in the past week.  He verbalized understanding and appreciative of the call.

## 2023-03-09 NOTE — Progress Notes (Signed)
  Received: Today [place], Anderson Malta, FNP  Yael Angerer, Josephine Igo, RN Thanks Jacki Cones, She can take an extra dose of metolazone 2.5 mg this week with extra 40 KCL

## 2023-03-09 NOTE — Progress Notes (Signed)
Message sent to [name] Setting, [name] EMT regarding recommendation for patient to take extra Metolazone 2.5 mg this week with extra 40 KCL.  [name] acknowledged she will add a 2nd Metolazone and Potassium to her pill box for this week either Wed or Thursday.

## 2023-03-09 NOTE — Progress Notes (Unsigned)
Metolazone 2.5 mg with additional of potassium added to Thursday morning medications.   Will follow up for a home visit on Monday   [name] Setting EMT-P Community Paramedic  [phone]

## 2023-03-09 NOTE — Progress Notes (Signed)
Received message update from [name] Setting with [name]:  [name] visited patient on 10/14 and only complaint was that of a sore throat. She denied any shortness of breath and was able to ambulate around her house without dyspnea.   She gained 1lb this past week, but last week had gained 8.5 lbs.   Pt denied any change in symptoms over the past few weeks. Pedal edema has decreased to +1 at mid shin today from +2 globally in her lower extremity last week.  Metolazone dose was 10/14 and next one due is 10/28.  Copy sent to Prince Rome, NP at [facility] clinic for review and recommendations.   Device report received prior to 10/14 Metolazone dosage.

## 2023-03-11 ENCOUNTER — Other Ambulatory Visit (HOSPITAL_COMMUNITY): Payer: Self-pay | Admitting: Internal Medicine

## 2023-03-15 ENCOUNTER — Ambulatory Visit: Payer: Medicare HMO

## 2023-03-15 ENCOUNTER — Other Ambulatory Visit (HOSPITAL_COMMUNITY): Payer: Self-pay | Admitting: Emergency Medicine

## 2023-03-15 DIAGNOSIS — I5022 Chronic systolic (congestive) heart failure: Secondary | ICD-10-CM

## 2023-03-15 NOTE — Progress Notes (Unsigned)
Paramedicine Encounter    Patient ID: Rhonda [name], female    DOB: 1951/01/10, 72 y.o.   MRN: [id number]   Complaints - sore throat. Exertional shortness of breath while moving things around.   Assessment - +1 edema, lung sounds clear   Compliance with meds - missed 1x noon dose   Pill box filled - for 1x week    Refills needed - carvedilol, torsemide, namenda, vitamin b12, multivitamin.   Meds changes since last visit - none    Social changes - none    VISIT SUMMARY**  Met with Rhonda [name] in the home today. Pt continues to complain about a sore throat, but has not sought any further assessment of same. Pt reports feeling well, but advises that she gets lonely and bored staying in the house for long stretches of time. Pt denied any shortness of breath, just upon exertion when moving large boxes around. We considered alternatives to moving heavy boxes is pulling up a chair and going through the box before moving it. Pt understood and agreed. Pt's pill box was filled for 1x week. All upcoming appointments were reviewed. Pt's need for a BMET was reviewed and relayed to Rhonda [name] for same. He advised that he would get back after he looks at his schedule. Will follow up before the end of the week due to pt not having enough meds to complete her pill box.   BP 120/60   Pulse 82   Resp 16   Wt 193 lb 4.8 oz (87.7 kg)   SpO2 96%   BMI 35.93 kg/m  Weight yesterday-DNW Last visit weight-193.4lbs  [name] Setting EMT-P Community Paramedic  [phone]   ACTION: Home visit completed     Patient Care Team: Shon Hale, MD as PCP - General (Family Medicine) Burna Sis, LCSW as Social Worker (Licensed Clinical Social Worker) [name], Noberto Retort, DO as Consulting Physician (Neurology) [name], Paramedic as Paramedic Chanetta Marshall, Meridee Score, MD as Attending Physician (Family Medicine) Clinton Gallant, RN as [facility] Care Management Bensimhon, Bevelyn Buckles, MD as  Consulting Physician (Cardiology) Lolly Mustache, Phillips Grout, MD as Consulting Physician (Psychiatry) Candelaria Stagers, DPM as Consulting Physician (Podiatry)  Patient Active Problem List   Diagnosis Date Noted   Major depressive disorder, recurrent episode, severe (HCC) 07/27/2022   MDD (major depressive disorder), recurrent severe, without psychosis (HCC) 07/27/2022   MDD (major depressive disorder) 07/27/2022   Elevated troponin 12/02/2021   Nausea & vomiting 12/01/2021   Acute left ankle pain 10/11/2021   Intractable nausea and vomiting 10/08/2021   AKI (acute kidney injury) (HCC) 10/08/2021   Hyponatremia 10/08/2021   Decreased estrogen level 08/01/2021   Dementia (HCC) 08/01/2021   Frail elderly 08/01/2021   Hyperparathyroidism (HCC) 08/01/2021   Spondylolisthesis 08/01/2021   Varicose veins of bilateral lower extremities with other complications 08/01/2021   Lumbago without sciatica 04/30/2021   Abnormal gait 06/11/2020   Adult failure to thrive syndrome 06/11/2020   Allergic rhinitis 06/11/2020   Anxiety 06/11/2020   Asthma 06/11/2020   Benign intracranial hypertension 06/11/2020   Body mass index (BMI) 45.0-49.9, adult (HCC) 06/11/2020   Bowel incontinence 06/11/2020   Cholelithiasis 06/11/2020   Chronic sinusitis 06/11/2020   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   Daytime somnolence 06/11/2020   Diarrhea of presumed infectious origin 06/11/2020   Edema 06/11/2020   Family history of malignant neoplasm of gastrointestinal tract 06/11/2020   Gout 06/11/2020   History of fall 06/11/2020   History of infectious disease  06/11/2020   Insomnia 06/11/2020   Irregular bowel habits 06/11/2020   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   Lumbar spondylosis with myelopathy 06/11/2020   Macrocytosis 06/11/2020   Mild recurrent major depression (HCC) 06/11/2020   History of colonic polyps 06/11/2020   Personal history of malignant neoplasm of breast 06/11/2020   Repeated falls 06/11/2020   Mixed anxiety and  depressive disorder 06/11/2020   Irritable bowel syndrome 01/04/2020   Spinal stenosis of lumbar region 01/03/2020   Other symptoms and signs involving the musculoskeletal system 09/11/2019   Bilateral leg weakness 08/01/2019   Leg swelling 08/01/2019   Diabetes mellitus with neuropathy (HCC) 06/15/2019   Hypokalemia 11/08/2018   CKD (chronic kidney disease) stage 3, GFR 30-59 ml/min (HCC) 11/08/2018   Orthostatic hypotension 07/28/2017   SVD (spontaneous vaginal delivery)    Peripheral neuropathy    On home oxygen therapy    Migraines    Left bundle branch block    Hypothyroidism    Hypertension    Hyperlipidemia    Heart murmur    GERD (gastroesophageal reflux disease)    Exertional shortness of breath    Major depressive disorder    Back pain    Arthritis    Generalized anxiety disorder    Anemia    Chronic respiratory failure (HCC) 09/14/2013   Biventricular ICD (implantable cardioverter-defibrillator) in place 08/04/2013   Morbid obesity (HCC) 11/01/2012   Chronic systolic heart failure (HCC) 10/27/2012   Endometrial polyp 01/20/2012   Malignant tumor of breast (HCC) 03/26/2011   Vitamin D deficiency 03/26/2011    Current Outpatient Medications:    acetaZOLAMIDE (DIAMOX) 125 MG tablet, Take 1 tablet (125 mg total) by mouth daily., Disp: 90 tablet, Rfl: 3   Ascorbic Acid (VITAMIN C) 1000 MG tablet, Take 1,000 mg by mouth daily., Disp: , Rfl:    buPROPion (WELLBUTRIN XL) 150 MG 24 hr tablet, Take 1 tablet (150 mg total) by mouth daily., Disp: 30 tablet, Rfl: 2   carvedilol (COREG) 3.125 MG tablet, TAKE 1 TABLET (3.125 MG) BY MOUTH TWICE DAILY WITH MEALS, Disp: 180 tablet, Rfl: 3   citalopram (CELEXA) 20 MG tablet, Take 1 tablet (20 mg total) by mouth daily., Disp: 30 tablet, Rfl: 2   clonazePAM (KLONOPIN) 0.5 MG tablet, Take 1 tablet (0.5 mg total) by mouth 2 (two) times daily., Disp: 60 tablet, Rfl: 2   cyanocobalamin (VITAMIN B12) 1000 MCG tablet, Take 1,000 mcg by  mouth daily., Disp: , Rfl:    desloratadine (CLARINEX) 5 MG tablet, Take 1 tablet (5 mg total) by mouth daily., Disp: 90 tablet, Rfl: 3   dicyclomine (BENTYL) 20 MG tablet, Take 20 mg by mouth 3 (three) times daily before meals., Disp: , Rfl:    fluticasone (FLONASE) 50 MCG/ACT nasal spray, Place 1 spray into both nostrils daily., Disp: , Rfl:    gabapentin (NEURONTIN) 100 MG capsule, Take 1 capsule (100 mg total) by mouth 2 (two) times daily., Disp: 180 capsule, Rfl: 3   levothyroxine (SYNTHROID) 88 MCG tablet, TAKE 1 TABLET(88 MCG) BY MOUTH DAILY BEFORE BREAKFAST, Disp: 90 tablet, Rfl: 3   metolazone (ZAROXOLYN) 2.5 MG tablet, TAKE 1 TABLET BY MOUTH EVERY OTHER TUESDAY., Disp: 12 tablet, Rfl: 3   midodrine (PROAMATINE) 5 MG tablet, Take 1 tablet (5 mg total) by mouth 3 (three) times daily., Disp: 200 tablet, Rfl: 3   Multiple Vitamin (MULTIVITAMIN WITH MINERALS) TABS tablet, Take 1 tablet by mouth daily., Disp: , Rfl:    Ondansetron HCl (ZOFRAN  PO), 4 mg. Take 1 tablet on the tongue and allow to dissolve, Disp: , Rfl:    potassium chloride SA (KLOR-CON M) 20 MEQ tablet, TAKE 2 TABLETS THREE TIMES DAILY. TAKE AN EXTRA 2 TABLETS ON DAYS YOU TAKE METOLAZONE, Disp: 572 tablet, Rfl: 3   risperiDONE (RISPERDAL) 0.5 MG tablet, Take 1 tablet (0.5 mg total) by mouth 2 (two) times daily at 8 am and 4 pm., Disp: 60 tablet, Rfl: 2   simvastatin (ZOCOR) 10 MG tablet, TAKE 1 TABLET EVERY EVENING, Disp: 90 tablet, Rfl: 3   spironolactone (ALDACTONE) 25 MG tablet, TAKE 1 TABLET (25 MG TOTAL) BY MOUTH EVERY EVENING., Disp: 90 tablet, Rfl: 3   torsemide (DEMADEX) 20 MG tablet, Take 4 tablets (80 mg total) by mouth 2 (two) times daily., Disp: 240 tablet, Rfl: 6   azelastine (ASTELIN) 0.1 % nasal spray, Place 2 sprays into both nostrils 2 (two) times daily. Use in each nostril as directed (Patient not taking: Reported on 02/15/2023), Disp: 30 mL, Rfl: 12   memantine (NAMENDA) 5 MG tablet, Take 1 tablet (5 mg total) by  mouth daily. (Patient not taking: Reported on 03/01/2023), Disp: 30 tablet, Rfl: 3 Allergies  Allergen Reactions   Ceftin Anaphylaxis    Face and throat swell    Cefuroxime Axetil Anaphylaxis    Face and throat swell   Geodon [Ziprasidone Hcl] Hives   Lisinopril Other (See Comments)    angioedema   Cefuroxime     Other reaction(s): anaphylaxis   Sulfacetamide Sodium-Sulfur     Other reaction(s): itch   Allopurinol Nausea Only and Other (See Comments)    weakness   Ativan [Lorazepam] Itching   Sulfa Antibiotics Itching   Valium [Diazepam] Other (See Comments)    Patient states that diazepam doesn't relax, it has the opposite effect.     Social History   Socioeconomic History   Marital status: Married    Spouse name: Not on file   Number of children: 2   Years of education: 23   Highest education level: Master's degree (e.g., MA, MS, MEng, MEd, MSW, MBA)  Occupational History   Occupation: retired  Tobacco Use   Smoking status: Some Days    Current packs/day: 0.00    Average packs/day: 0.1 packs/day for 26.0 years (2.6 ttl pk-yrs)    Types: Cigarettes    Start date: 03/24/1995    Last attempt to quit: 03/23/2021    Years since quitting: 1.9   Smokeless tobacco: Never  Vaping Use   Vaping status: Never Used  Substance and Sexual Activity   Alcohol use: No   Drug use: No   Sexual activity: Yes  Other Topics Concern   Not on file  Social History Narrative   Tobacco Use Cigarettes: Former Smoker, Quit in 2008   No Alcohol   No recreational drug use   Diet: Regular/Low Carb   Exercise: None   Occupation: disabled   Education: Company secretary, masters   Children: 2   Firearms: No   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      Social Determinants of Health   Financial Resource Strain: Not on file  Food Insecurity: No Food Insecurity (08/11/2022)   Hunger Vital Sign    Worried About Running Out of Food in the Last Year:  Never true    Ran Out of Food in the Last Year: Never true  Transportation Needs: No  Transportation Needs (08/11/2022)   PRAPARE - Administrator, Civil Service (Medical): No    Lack of Transportation (Non-Medical): No  Physical Activity: Not on file  Stress: No Stress Concern Present (08/11/2022)   [name] of Occupational Health - Occupational Stress Questionnaire    Feeling of Stress : Only a little  Social Connections: Not on file  Intimate Partner Violence: Not At Risk (07/27/2022)   Humiliation, Afraid, Rape, and Kick questionnaire    Fear of Current or Ex-Partner: No    Emotionally Abused: No    Physically Abused: No    Sexually Abused: No    Physical Exam      Future Appointments  Date Time Provider Department Center  03/16/2023  7:00 AM [facility] DEVICE [facility]  06/02/2023  1:45 PM Freddie Breech, DPM [facility]  06/10/2023  3:20 PM Arfeen, Phillips Grout, MD [facility] None  06/14/2023  7:10 AM [facility] DEVICE REMOTES [facility]  06/29/2023  1:30 PM Nita Sickle K, DO [facility] None  09/13/2023  7:10 AM [facility] DEVICE REMOTES [facility]  12/22/2023  8:30 AM Rosann Auerbach, PhD [facility] None  12/22/2023  9:30 AM [facility] NEUROPSYCH TECH [facility] None  12/30/2023 10:00 AM Rosann Auerbach, PhD [facility] None

## 2023-03-16 ENCOUNTER — Ambulatory Visit: Payer: Medicare HMO | Attending: Internal Medicine

## 2023-03-16 DIAGNOSIS — I5022 Chronic systolic (congestive) heart failure: Secondary | ICD-10-CM | POA: Diagnosis not present

## 2023-03-16 DIAGNOSIS — Z95810 Presence of automatic (implantable) cardiac defibrillator: Secondary | ICD-10-CM | POA: Diagnosis not present

## 2023-03-16 LAB — CUP PACEART REMOTE DEVICE CHECK
Battery Remaining Longevity: 50 mo
Battery Voltage: 2.97 V
Brady Statistic AP VP Percent: 0.32 %
Brady Statistic AP VS Percent: 0.02 %
Brady Statistic AS VP Percent: 98.79 %
Brady Statistic AS VS Percent: 0.87 %
Brady Statistic RA Percent Paced: 0.34 %
Brady Statistic RV Percent Paced: 98.89 %
Date Time Interrogation Session: 20241021052608
HighPow Impedance: 76 Ohm
Implantable Lead Connection Status: 753985
Implantable Lead Connection Status: 753985
Implantable Lead Connection Status: 753985
Implantable Lead Implant Date: 20141210
Implantable Lead Implant Date: 20141210
Implantable Lead Implant Date: 20141210
Implantable Lead Location: 753858
Implantable Lead Location: 753859
Implantable Lead Location: 753860
Implantable Lead Model: 4396
Implantable Lead Model: 5076
Implantable Lead Model: 6935
Implantable Pulse Generator Implant Date: 20211118
Lead Channel Impedance Value: 399 Ohm
Lead Channel Impedance Value: 456 Ohm
Lead Channel Impedance Value: 589 Ohm
Lead Channel Impedance Value: 589 Ohm
Lead Channel Impedance Value: 665 Ohm
Lead Channel Impedance Value: 779 Ohm
Lead Channel Pacing Threshold Amplitude: 0.5 V
Lead Channel Pacing Threshold Amplitude: 0.875 V
Lead Channel Pacing Threshold Amplitude: 1.25 V
Lead Channel Pacing Threshold Pulse Width: 0.4 ms
Lead Channel Pacing Threshold Pulse Width: 0.4 ms
Lead Channel Pacing Threshold Pulse Width: 0.4 ms
Lead Channel Sensing Intrinsic Amplitude: 1.875 mV
Lead Channel Sensing Intrinsic Amplitude: 1.875 mV
Lead Channel Sensing Intrinsic Amplitude: 23 mV
Lead Channel Sensing Intrinsic Amplitude: 23 mV
Lead Channel Setting Pacing Amplitude: 1.5 V
Lead Channel Setting Pacing Amplitude: 2 V
Lead Channel Setting Pacing Amplitude: 2.5 V
Lead Channel Setting Pacing Pulse Width: 0.4 ms
Lead Channel Setting Pacing Pulse Width: 0.4 ms
Lead Channel Setting Sensing Sensitivity: 0.3 mV
Zone Setting Status: 755011
Zone Setting Status: 755011

## 2023-03-18 DIAGNOSIS — E1142 Type 2 diabetes mellitus with diabetic polyneuropathy: Secondary | ICD-10-CM | POA: Diagnosis not present

## 2023-03-18 DIAGNOSIS — Z0000 Encounter for general adult medical examination without abnormal findings: Secondary | ICD-10-CM | POA: Diagnosis not present

## 2023-03-18 DIAGNOSIS — Z79899 Other long term (current) drug therapy: Secondary | ICD-10-CM | POA: Diagnosis not present

## 2023-03-18 DIAGNOSIS — F339 Major depressive disorder, recurrent, unspecified: Secondary | ICD-10-CM | POA: Diagnosis not present

## 2023-03-18 DIAGNOSIS — E790 Hyperuricemia without signs of inflammatory arthritis and tophaceous disease: Secondary | ICD-10-CM | POA: Diagnosis not present

## 2023-03-18 DIAGNOSIS — E039 Hypothyroidism, unspecified: Secondary | ICD-10-CM | POA: Diagnosis not present

## 2023-03-18 DIAGNOSIS — I5032 Chronic diastolic (congestive) heart failure: Secondary | ICD-10-CM | POA: Diagnosis not present

## 2023-03-18 DIAGNOSIS — E785 Hyperlipidemia, unspecified: Secondary | ICD-10-CM | POA: Diagnosis not present

## 2023-03-18 DIAGNOSIS — Z23 Encounter for immunization: Secondary | ICD-10-CM | POA: Diagnosis not present

## 2023-03-18 DIAGNOSIS — E559 Vitamin D deficiency, unspecified: Secondary | ICD-10-CM | POA: Diagnosis not present

## 2023-03-18 DIAGNOSIS — N1832 Chronic kidney disease, stage 3b: Secondary | ICD-10-CM | POA: Diagnosis not present

## 2023-03-18 NOTE — Progress Notes (Signed)
EPIC Encounter for ICM Monitoring  Patient Name: Rhonda [name] is a 72 y.o. female Date: 03/18/2023 Primary Care Physican: Shon Hale, MD Primary Cardiologist: Bensimhon Electrophysiologist: Rae Roam Pacing: 98.6%   06/01/2022 Weight: 193 lbs  07/08/2022 Weight: 183 lbs 08/10/2022 Weight: 185 lbs 11/25/2022 Weight: 180 lbs 12/30/2022 Weight: 184 lbs         Transmission results reviewed.                  Optivol thoracic impedance suggesting fluid levels returned to normal 10/22.     Prescribed:  [name] Setting with paramedicine program assists with meds. Torsemide 20 mg 4 tablets (80 mg total) by mouth twice a day  Potassium 20 meq 2 tablets (40 mEq total) by mouth 3 (three) times daily.  Take extra 2 tablets on days you take Metolazone.   Metolazone 2.5mg  1 tablet every other Tuesday.  (next dosage due 03/22/23) Spironolactone 25 mg take 1 tablet by mouth every evening.   Labs: 01/13/2023 Creatinine 1.62, BUN 28, Potassium 3.5, Sodium 134, GFR 34 12/30/2022 Creatinine 1.64, BUN 31, Potassium 3.6, Sodium 134, GFR 33 12/02/2022 Creatinine 1.68 BUN 23,  Potassium 4.0, Sodium 136, GFR 33 08/01/2022 Creatinine 1.47, BUN 42, Potassium 3.1, Sodium 136, GFR 38  07/26/2022 Creatinine 2.02, BUN 25, Potassium 4.6, Sodium 133, GFR 26  07/22/2022 Creatinine 1.57, BUN 20, Potassium 3.8, Sodium 136, GFR 35  07/14/2022 Creatinine 1.32, BUN 20, Potassium 4.2, Sodium 138, GFR 44 06/25/2022 Creatinine 2.19, BUN 29, Potassium 3.5, Sodium 134, GFR 24 A complete set of results can be found in Results Review.   Recommendations:  No changes.    Follow-up plan: ICM clinic phone appointment on 04/29/2023.   91 day device clinic remote transmission 06/14/2023.     EP/Cardiology Office Visits:  Recall 05/29/2023 with Dr Gala Romney.  Recall 03/20/2023 with Dr Ladona Ridgel.     Copy of ICM check sent to Dr. Ladona Ridgel.   3 month ICM trend: 03/16/2023.    12-14 Month ICM trend:     Karie Soda,  RN 03/18/2023 8:01 AM

## 2023-03-22 ENCOUNTER — Other Ambulatory Visit (HOSPITAL_COMMUNITY): Payer: Self-pay | Admitting: Emergency Medicine

## 2023-03-22 NOTE — Progress Notes (Unsigned)
Paramedicine Encounter    Patient ID: Rhonda [name], female    DOB: [birth year], 72 y.o.   MRN: [id number]   Complaints- increased exertional shortness of breath - no change to her baseline  Assessment - Lung sounds clear, +1 edema  Compliance with meds - missed 2x morning doses   Pill box filled - for 1x week .  Refills needed - namenda, B12, gabapentin, torsemide.   Meds changes since last visit - Pt took extra metolazone 10/15, and still hasn't followed up for labs. Husband reminded of same.     Social changes - none   VISIT SUMMARY**  Met with [facility] in the home today. Pt reported increased exertional shortness of breath today. Pt walking with a cane which is out side of the norm in her home. Pt was reminded of necessary labs still outstanding that she needed to take from her extra metolazone dose. Pt reported that she was unaware despite multpile prior reminders. Husband notified by text and advised that he would complete asap. Pt's pill box was filled for 1x week. Reviewed all upcomoing appointments. Will follow up in 1x week.   BP 108/62   Pulse 78   Resp 16   Wt 196 lb 6.4 oz (89.1 kg)   SpO2 98%   BMI 36.51 kg/m  Weight yesterday-DNW Last visit weight-193 lbs   [name] Setting EMT-P Community Paramedic  [phone]    ACTION: Home visit completed     Patient Care Team: Shon Hale, MD as PCP - General (Family Medicine) Burna Sis, LCSW as Social Worker (Licensed Clinical Social Worker) [name], Noberto Retort, DO as Consulting Physician (Neurology) [name], Paramedic as Paramedic Chanetta Marshall, Meridee Score, MD as Attending Physician (Family Medicine) Clinton Gallant, RN as [facility] Care Management Bensimhon, Bevelyn Buckles, MD as Consulting Physician (Cardiology) Lolly Mustache Phillips Grout, MD as Consulting Physician (Psychiatry) Candelaria Stagers, DPM as Consulting Physician (Podiatry)  Patient Active Problem List   Diagnosis Date Noted    Major depressive disorder, recurrent episode, severe (HCC) 07/27/2022   MDD (major depressive disorder), recurrent severe, without psychosis (HCC) 07/27/2022   MDD (major depressive disorder) 07/27/2022   Elevated troponin 12/02/2021   Nausea & vomiting 12/01/2021   Acute left ankle pain 10/11/2021   Intractable nausea and vomiting 10/08/2021   AKI (acute kidney injury) (HCC) 10/08/2021   Hyponatremia 10/08/2021   Decreased estrogen level 08/01/2021   Dementia (HCC) 08/01/2021   Frail elderly 08/01/2021   Hyperparathyroidism (HCC) 08/01/2021   Spondylolisthesis 08/01/2021   Varicose veins of bilateral lower extremities with other complications 08/01/2021   Lumbago without sciatica 04/30/2021   Abnormal gait 06/11/2020   Adult failure to thrive syndrome 06/11/2020   Allergic rhinitis 06/11/2020   Anxiety 06/11/2020   Asthma 06/11/2020   Benign intracranial hypertension 06/11/2020   Body mass index (BMI) 45.0-49.9, adult (HCC) 06/11/2020   Bowel incontinence 06/11/2020   Cholelithiasis 06/11/2020   Chronic sinusitis 06/11/2020   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   Daytime somnolence 06/11/2020   Diarrhea of presumed infectious origin 06/11/2020   Edema 06/11/2020   Family history of malignant neoplasm of gastrointestinal tract 06/11/2020   Gout 06/11/2020   History of fall 06/11/2020   History of infectious disease 06/11/2020   Insomnia 06/11/2020   Irregular bowel habits 06/11/2020   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   Lumbar spondylosis with myelopathy 06/11/2020   Macrocytosis 06/11/2020   Mild recurrent major depression (HCC) 06/11/2020   History of colonic polyps 06/11/2020  Personal history of malignant neoplasm of breast 06/11/2020   Repeated falls 06/11/2020   Mixed anxiety and depressive disorder 06/11/2020   Irritable bowel syndrome 01/04/2020   Spinal stenosis of lumbar region 01/03/2020   Other symptoms and signs involving the musculoskeletal system 09/11/2019   Bilateral  leg weakness 08/01/2019   Leg swelling 08/01/2019   Diabetes mellitus with neuropathy (HCC) 06/15/2019   Hypokalemia 11/08/2018   CKD (chronic kidney disease) stage 3, GFR 30-59 ml/min (HCC) 11/08/2018   Orthostatic hypotension 07/28/2017   SVD (spontaneous vaginal delivery)    Peripheral neuropathy    On home oxygen therapy    Migraines    Left bundle branch block    Hypothyroidism    Hypertension    Hyperlipidemia    Heart murmur    GERD (gastroesophageal reflux disease)    Exertional shortness of breath    Major depressive disorder    Back pain    Arthritis    Generalized anxiety disorder    Anemia    Chronic respiratory failure (HCC) 09/14/2013   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   Morbid obesity (HCC) 11/01/2012   Chronic systolic heart failure (HCC) 10/27/2012   Endometrial polyp 01/20/2012   Malignant tumor of breast (HCC) 03/26/2011   Vitamin D deficiency 03/26/2011    Current Outpatient Medications:    acetaZOLAMIDE (DIAMOX) 125 MG tablet, Take 1 tablet (125 mg total) by mouth daily., Disp: 90 tablet, Rfl: 3   Ascorbic Acid (VITAMIN C) 1000 MG tablet, Take 1,000 mg by mouth daily., Disp: , Rfl:    buPROPion (WELLBUTRIN XL) 150 MG 24 hr tablet, Take 1 tablet (150 mg total) by mouth daily., Disp: 30 tablet, Rfl: 2   carvedilol (COREG) 3.125 MG tablet, TAKE 1 TABLET (3.125 MG) BY MOUTH TWICE DAILY WITH MEALS, Disp: 180 tablet, Rfl: 3   citalopram (CELEXA) 20 MG tablet, Take 1 tablet (20 mg total) by mouth daily., Disp: 30 tablet, Rfl: 2   clonazePAM (KLONOPIN) 0.5 MG tablet, Take 1 tablet (0.5 mg total) by mouth 2 (two) times daily., Disp: 60 tablet, Rfl: 2   cyanocobalamin (VITAMIN B12) 1000 MCG tablet, Take 1,000 mcg by mouth daily., Disp: , Rfl:    desloratadine (CLARINEX) 5 MG tablet, Take 1 tablet (5 mg total) by mouth daily., Disp: 90 tablet, Rfl: 3   dicyclomine (BENTYL) 20 MG tablet, Take 20 mg by mouth 3 (three) times  daily before meals., Disp: , Rfl:    fluticasone (FLONASE) 50 MCG/ACT nasal spray, Place 1 spray into both nostrils daily., Disp: , Rfl:    gabapentin (NEURONTIN) 100 MG capsule, Take 1 capsule (100 mg total) by mouth 2 (two) times daily., Disp: 180 capsule, Rfl: 3   levothyroxine (SYNTHROID) 88 MCG tablet, TAKE 1 TABLET(88 MCG) BY MOUTH DAILY BEFORE BREAKFAST, Disp: 90 tablet, Rfl: 3   metolazone (ZAROXOLYN) 2.5 MG tablet, TAKE 1 TABLET BY MOUTH EVERY OTHER TUESDAY., Disp: 12 tablet, Rfl: 3   midodrine (PROAMATINE) 5 MG tablet, Take 1 tablet (5 mg total) by mouth 3 (three) times daily., Disp: 200 tablet, Rfl: 3   Multiple Vitamin (MULTIVITAMIN WITH MINERALS) TABS tablet, Take 1 tablet by mouth daily., Disp: , Rfl:    Ondansetron HCl (ZOFRAN PO), 4 mg. Take 1 tablet on the tongue and allow to dissolve, Disp: , Rfl:    potassium chloride SA (KLOR-CON M) 20 MEQ tablet, TAKE 2 TABLETS THREE TIMES DAILY. TAKE AN EXTRA 2 TABLETS ON DAYS YOU TAKE METOLAZONE, Disp:  572 tablet, Rfl: 3   risperiDONE (RISPERDAL) 0.5 MG tablet, Take 1 tablet (0.5 mg total) by mouth 2 (two) times daily at 8 am and 4 pm., Disp: 60 tablet, Rfl: 2   simvastatin (ZOCOR) 10 MG tablet, TAKE 1 TABLET EVERY EVENING, Disp: 90 tablet, Rfl: 3   spironolactone (ALDACTONE) 25 MG tablet, TAKE 1 TABLET (25 MG TOTAL) BY MOUTH EVERY EVENING., Disp: 90 tablet, Rfl: 3   torsemide (DEMADEX) 20 MG tablet, Take 4 tablets (80 mg total) by mouth 2 (two) times daily., Disp: 240 tablet, Rfl: 6   azelastine (ASTELIN) 0.1 % nasal spray, Place 2 sprays into both nostrils 2 (two) times daily. Use in each nostril as directed (Patient not taking: Reported on 02/15/2023), Disp: 30 mL, Rfl: 12   memantine (NAMENDA) 5 MG tablet, Take 1 tablet (5 mg total) by mouth daily. (Patient not taking: Reported on 03/01/2023), Disp: 30 tablet, Rfl: 3 Allergies  Allergen Reactions   Ceftin Anaphylaxis    Face and throat swell    Cefuroxime Axetil Anaphylaxis    Face and  throat swell   Geodon [Ziprasidone Hcl] Hives   Lisinopril Other (See Comments)    angioedema   Cefuroxime     Other reaction(s): anaphylaxis   Sulfacetamide Sodium-Sulfur     Other reaction(s): itch   Allopurinol Nausea Only and Other (See Comments)    weakness   Ativan [Lorazepam] Itching   Sulfa Antibiotics Itching   Valium [Diazepam] Other (See Comments)    Patient states that diazepam doesn't relax, it has the opposite effect.     Social History   Socioeconomic History   Marital status: Married    Spouse name: Not on file   Number of children: 2   Years of education: 57   Highest education level: Master's degree (e.g., MA, MS, MEng, MEd, MSW, MBA)  Occupational History   Occupation: retired  Tobacco Use   Smoking status: Some Days    Current packs/day: 0.00    Average packs/day: 0.1 packs/day for 26.0 years (2.6 ttl pk-yrs)    Types: Cigarettes    Start date: 03/24/1995    Last attempt to quit: 03/23/2021    Years since quitting: 2.0   Smokeless tobacco: Never  Vaping Use   Vaping status: Never Used  Substance and Sexual Activity   Alcohol use: No   Drug use: No   Sexual activity: Yes  Other Topics Concern   Not on file  Social History Narrative   Tobacco Use Cigarettes: Former Smoker, Quit in 2008   No Alcohol   No recreational drug use   Diet: Regular/Low Carb   Exercise: None   Occupation: disabled   Education: Company secretary, masters   Children: 2   Firearms: No   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      Social Determinants of Health   Financial Resource Strain: Not on file  Food Insecurity: No Food Insecurity (08/11/2022)   Hunger Vital Sign    Worried About Running Out of Food in the Last Year: Never true    Ran Out of Food in the Last Year: Never true  Transportation Needs: No Transportation Needs (08/11/2022)   PRAPARE - Administrator, Civil Service (Medical): No    Lack of  Transportation (Non-Medical): No  Physical Activity: Not on file  Stress: No Stress Concern Present (08/11/2022)   [name] of Occupational Health -  Occupational Stress Questionnaire    Feeling of Stress : Only a little  Social Connections: Not on file  Intimate Partner Violence: Not At Risk (07/27/2022)   Humiliation, Afraid, Rape, and Kick questionnaire    Fear of Current or Ex-Partner: No    Emotionally Abused: No    Physically Abused: No    Sexually Abused: No    Physical Exam      Future Appointments  Date Time Provider Department Center  04/27/2023  7:00 AM [facility] DEVICE REMOTES [facility]  06/02/2023  1:45 PM Freddie Breech, DPM [facility]  06/10/2023  3:20 PM Arfeen, Phillips Grout, MD [facility] None  06/14/2023  7:10 AM [facility] DEVICE REMOTES [facility]  06/29/2023  1:30 PM Nita Sickle K, DO [facility] None  09/13/2023  7:10 AM [facility] DEVICE REMOTES [facility]  12/22/2023  8:30 AM Rosann Auerbach, PhD [facility] None  12/22/2023  9:30 AM [facility] None  12/30/2023 10:00 AM Rosann Auerbach, PhD [facility] None

## 2023-03-23 ENCOUNTER — Other Ambulatory Visit: Payer: Self-pay | Admitting: Neurology

## 2023-03-24 ENCOUNTER — Telehealth (HOSPITAL_COMMUNITY): Payer: Self-pay | Admitting: Licensed Clinical Social Worker

## 2023-03-24 DIAGNOSIS — N1832 Chronic kidney disease, stage 3b: Secondary | ICD-10-CM | POA: Diagnosis not present

## 2023-03-24 DIAGNOSIS — Z79899 Other long term (current) drug therapy: Secondary | ICD-10-CM | POA: Diagnosis not present

## 2023-03-24 DIAGNOSIS — N2581 Secondary hyperparathyroidism of renal origin: Secondary | ICD-10-CM | POA: Diagnosis not present

## 2023-03-24 DIAGNOSIS — M1000 Idiopathic gout, unspecified site: Secondary | ICD-10-CM | POA: Diagnosis not present

## 2023-03-24 DIAGNOSIS — I129 Hypertensive chronic kidney disease with stage 1 through stage 4 chronic kidney disease, or unspecified chronic kidney disease: Secondary | ICD-10-CM | POA: Diagnosis not present

## 2023-03-24 DIAGNOSIS — I951 Orthostatic hypotension: Secondary | ICD-10-CM | POA: Diagnosis not present

## 2023-03-24 DIAGNOSIS — D631 Anemia in chronic kidney disease: Secondary | ICD-10-CM | POA: Diagnosis not present

## 2023-03-24 DIAGNOSIS — I5022 Chronic systolic (congestive) heart failure: Secondary | ICD-10-CM

## 2023-03-24 DIAGNOSIS — I509 Heart failure, unspecified: Secondary | ICD-10-CM | POA: Diagnosis not present

## 2023-03-24 DIAGNOSIS — E1122 Type 2 diabetes mellitus with diabetic chronic kidney disease: Secondary | ICD-10-CM | POA: Diagnosis not present

## 2023-03-24 NOTE — Telephone Encounter (Signed)
H&V Care Navigation CSW Progress Note  Clinical Social Worker informed by [employer] of following concerns:  Patient and spouse wanting guidance on nutrition for patient- currently eating a lot of fast food as spouse works and patient is not able to cook much for herself given dementia concerns.  Was supposed to attend nutrition workshop but was unable to make it.  CSW placed order for nutrition consult to assist as able.  Patient also spending large amounts of time at home alone.  Would benefit from a home companion given limitations from dementia and medical conditions to help with chores, cooking, companionship.  Paramedic attempted to contact companionship program [facility] pals but was informed pt insurance is out of network.  CSW called [employer] to inquire if she has a benefit for companionship but they were unable to assist me without an order code which I was unable to locate.    CSW placed consult to [facility] to assist with any other options for in home care and companionship.     SDOH Screenings   Food Insecurity: No Food Insecurity (08/11/2022)  Housing: Low Risk  (08/11/2022)  Transportation Needs: No Transportation Needs (08/11/2022)  Utilities: Not At Risk (08/11/2022)  Alcohol Screen: Low Risk  (07/27/2022)  Depression (PHQ2-9): Low Risk  (08/11/2022)  Stress: No Stress Concern Present (08/11/2022)  Tobacco Use: High Risk (03/04/2023)   Burna Sis, LCSW Clinical Social Worker [facility] Desk#: [phone] Cell#: [phone]

## 2023-03-25 ENCOUNTER — Telehealth: Payer: Self-pay | Admitting: *Deleted

## 2023-03-25 NOTE — Progress Notes (Signed)
Care Coordination   Note   03/25/2023 Name: KOLIE RISSMAN MRN: [id number] DOB: [birth year]  VIANCA GARDENHIRE is a 72 y.o. year old female who sees Chanetta Marshall, Meridee Score, MD for primary care. I reached out to [name] by phone today to offer care coordination services.  Ms. Popal was given information about Care Coordination services today including:   The Care Coordination services include support from the care team which includes your Nurse Coordinator, Clinical Social Worker, or Pharmacist.  The Care Coordination team is here to help remove barriers to the health concerns and goals most important to you. Care Coordination services are voluntary, and the patient may decline or stop services at any time by request to their care team member.   Care Coordination Consent Status: Patient agreed to services and verbal consent obtained.   Follow up plan:  Telephone appointment with care coordination team member scheduled for:  03/31/2023  Encounter Outcome:  Patient Scheduled from referral   Burman Nieves, [facility] Care Coordination Care Guide Direct Dial: [phone]

## 2023-03-29 ENCOUNTER — Other Ambulatory Visit (HOSPITAL_COMMUNITY): Payer: Self-pay | Admitting: Emergency Medicine

## 2023-03-29 NOTE — Progress Notes (Signed)
Paramedicine Encounter    Patient ID: Rhonda [name], female    DOB: 04/22/1951, 72 y.o.   MRN: [id number]   Complaints - Fatigue, cold/cough symptoms x2 days, hands swelling.   Assessment - hands swollen/tight, gained 5 lbs since last weeks visit, +2 left leg, +3 right leg.    Compliance with meds - missed 1x dose of noon medication   Pill box filled - 1x week    Refills needed - Torsemide, Citalopram, risperidone, azelastine, clonazepam, dicyclomine, midodrine.   Meds changes since last visit - none     Social changes - none   VISIT SUMMARY**   Met with Rhonda [name] in the home. Pt reports that she has been resting as she has not felt well for 2-3 days. Pt reported that she has had a sore throat, cough and increased drainage. Pt reports taking DayQuil and NyQuil. Pt educated on better medication for symptoms that don't effect her vitals. Pt presented with increased weight gain since last visit. Pt reports that she has been eating a lot of low sodium soups to help with her throat, but reports she has not had any food or drink yet today. Pt was assessed to note increased pedal edema. Pt denies any pain, increased shortness of breath, chest pain, palpitations, or dizziness. Pt;s lung sounds noted to be clear and in no obvious distress. Note was sent to triage regarding weight gain and swelling. Pt out of Torsemide and will return tomorrow once medication is delivered. Pt's pill box was filled for 1x week. Upcoming appointments reviewed. Will follow up tomorrow for med rec.   BP (!) 100/58   Pulse 74   Resp 16   Wt 201 lb 4.8 oz (91.3 kg)   SpO2 97%   BMI 37.42 kg/m  Weight yesterday-DNW Last visit weight-196 lbs  [name] Setting EMT-P Community Paramedic  [phone]     ACTION: Home visit completed     Patient Care Team: Shon Hale, MD as PCP - General (Family Medicine) Burna Sis, LCSW as Social Worker (Licensed Clinical Social Worker) [name], Noberto Retort, DO  as Consulting Physician (Neurology) [name], Paramedic as Paramedic Chanetta Marshall, Meridee Score, MD as Attending Physician (Family Medicine) Clinton Gallant, RN as [facility] Care Management Bensimhon, Bevelyn Buckles, MD as Consulting Physician (Cardiology) Lolly Mustache, Phillips Grout, MD as Consulting Physician (Psychiatry) Candelaria Stagers, DPM as Consulting Physician (Podiatry)  Patient Active Problem List   Diagnosis Date Noted   Major depressive disorder, recurrent episode, severe (HCC) 07/27/2022   MDD (major depressive disorder), recurrent severe, without psychosis (HCC) 07/27/2022   MDD (major depressive disorder) 07/27/2022   Elevated troponin 12/02/2021   Nausea & vomiting 12/01/2021   Acute left ankle pain 10/11/2021   Intractable nausea and vomiting 10/08/2021   AKI (acute kidney injury) (HCC) 10/08/2021   Hyponatremia 10/08/2021   Decreased estrogen level 08/01/2021   Dementia (HCC) 08/01/2021   Frail elderly 08/01/2021   Hyperparathyroidism (HCC) 08/01/2021   Spondylolisthesis 08/01/2021   Varicose veins of bilateral lower extremities with other complications 08/01/2021   Lumbago without sciatica 04/30/2021   Abnormal gait 06/11/2020   Adult failure to thrive syndrome 06/11/2020   Allergic rhinitis 06/11/2020   Anxiety 06/11/2020   Asthma 06/11/2020   Benign intracranial hypertension 06/11/2020   Body mass index (BMI) 45.0-49.9, adult (HCC) 06/11/2020   Bowel incontinence 06/11/2020   Cholelithiasis 06/11/2020   Chronic sinusitis 06/11/2020   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   Daytime somnolence 06/11/2020  Diarrhea of presumed infectious origin 06/11/2020   Edema 06/11/2020   Family history of malignant neoplasm of gastrointestinal tract 06/11/2020   Gout 06/11/2020   History of fall 06/11/2020   History of infectious disease 06/11/2020   Insomnia 06/11/2020   Irregular bowel habits 06/11/2020   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   Lumbar spondylosis with myelopathy  06/11/2020   Macrocytosis 06/11/2020   Mild recurrent major depression (HCC) 06/11/2020   History of colonic polyps 06/11/2020   Personal history of malignant neoplasm of breast 06/11/2020   Repeated falls 06/11/2020   Mixed anxiety and depressive disorder 06/11/2020   Irritable bowel syndrome 01/04/2020   Spinal stenosis of lumbar region 01/03/2020   Other symptoms and signs involving the musculoskeletal system 09/11/2019   Bilateral leg weakness 08/01/2019   Leg swelling 08/01/2019   Diabetes mellitus with neuropathy (HCC) 06/15/2019   Hypokalemia 11/08/2018   CKD (chronic kidney disease) stage 3, GFR 30-59 ml/min (HCC) 11/08/2018   Orthostatic hypotension 07/28/2017   SVD (spontaneous vaginal delivery)    Peripheral neuropathy    On home oxygen therapy    Migraines    Left bundle branch block    Hypothyroidism    Hypertension    Hyperlipidemia    Heart murmur    GERD (gastroesophageal reflux disease)    Exertional shortness of breath    Major depressive disorder    Back pain    Arthritis    Generalized anxiety disorder    Anemia    Chronic respiratory failure (HCC) 09/14/2013   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/04/2013   Morbid obesity (HCC) 11/01/2012   Chronic systolic heart failure (HCC) 10/27/2012   Endometrial polyp 01/20/2012   Malignant tumor of breast (HCC) 03/26/2011   Vitamin D deficiency 03/26/2011    Current Outpatient Medications:    acetaZOLAMIDE (DIAMOX) 125 MG tablet, Take 1 tablet (125 mg total) by mouth daily., Disp: 90 tablet, Rfl: 3   Ascorbic Acid (VITAMIN C) 1000 MG tablet, Take 1,000 mg by mouth daily., Disp: , Rfl:    buPROPion (WELLBUTRIN XL) 150 MG 24 hr tablet, Take 1 tablet (150 mg total) by mouth daily., Disp: 30 tablet, Rfl: 2   carvedilol (COREG) 3.125 MG tablet, TAKE 1 TABLET (3.125 MG) BY MOUTH TWICE DAILY WITH MEALS, Disp: 180 tablet, Rfl: 3   citalopram (CELEXA) 20 MG tablet, Take 1 tablet (20 mg  total) by mouth daily., Disp: 30 tablet, Rfl: 2   clonazePAM (KLONOPIN) 0.5 MG tablet, Take 1 tablet (0.5 mg total) by mouth 2 (two) times daily., Disp: 60 tablet, Rfl: 2   cyanocobalamin (VITAMIN B12) 1000 MCG tablet, Take 1,000 mcg by mouth daily., Disp: , Rfl:    desloratadine (CLARINEX) 5 MG tablet, Take 1 tablet (5 mg total) by mouth daily., Disp: 90 tablet, Rfl: 3   dicyclomine (BENTYL) 20 MG tablet, Take 20 mg by mouth 3 (three) times daily before meals., Disp: , Rfl:    fluticasone (FLONASE) 50 MCG/ACT nasal spray, Place 1 spray into both nostrils daily., Disp: , Rfl:    gabapentin (NEURONTIN) 100 MG capsule, TAKE 1 CAPSULE (100 MG TOTAL) BY MOUTH 2 (TWO) TIMES DAILY., Disp: 180 capsule, Rfl: 0   levothyroxine (SYNTHROID) 88 MCG tablet, TAKE 1 TABLET(88 MCG) BY MOUTH DAILY BEFORE BREAKFAST, Disp: 90 tablet, Rfl: 3   metolazone (ZAROXOLYN) 2.5 MG tablet, TAKE 1 TABLET BY MOUTH EVERY OTHER TUESDAY., Disp: 12 tablet, Rfl: 3   midodrine (PROAMATINE) 5 MG tablet, Take 1 tablet (5  mg total) by mouth 3 (three) times daily., Disp: 200 tablet, Rfl: 3   Multiple Vitamin (MULTIVITAMIN WITH MINERALS) TABS tablet, Take 1 tablet by mouth daily., Disp: , Rfl:    Ondansetron HCl (ZOFRAN PO), 4 mg. Take 1 tablet on the tongue and allow to dissolve, Disp: , Rfl:    potassium chloride SA (KLOR-CON M) 20 MEQ tablet, TAKE 2 TABLETS THREE TIMES DAILY. TAKE AN EXTRA 2 TABLETS ON DAYS YOU TAKE METOLAZONE, Disp: 572 tablet, Rfl: 3   risperiDONE (RISPERDAL) 0.5 MG tablet, Take 1 tablet (0.5 mg total) by mouth 2 (two) times daily at 8 am and 4 pm., Disp: 60 tablet, Rfl: 2   simvastatin (ZOCOR) 10 MG tablet, TAKE 1 TABLET EVERY EVENING, Disp: 90 tablet, Rfl: 3   spironolactone (ALDACTONE) 25 MG tablet, TAKE 1 TABLET (25 MG TOTAL) BY MOUTH EVERY EVENING., Disp: 90 tablet, Rfl: 3   torsemide (DEMADEX) 20 MG tablet, Take 4 tablets (80 mg total) by mouth 2 (two) times daily., Disp: 240 tablet, Rfl: 6   azelastine (ASTELIN)  0.1 % nasal spray, Place 2 sprays into both nostrils 2 (two) times daily. Use in each nostril as directed (Patient not taking: Reported on 02/15/2023), Disp: 30 mL, Rfl: 12   memantine (NAMENDA) 5 MG tablet, Take 1 tablet (5 mg total) by mouth daily. (Patient not taking: Reported on 03/01/2023), Disp: 30 tablet, Rfl: 3 Allergies  Allergen Reactions   Ceftin Anaphylaxis    Face and throat swell    Cefuroxime Axetil Anaphylaxis    Face and throat swell   Geodon [Ziprasidone Hcl] Hives   Lisinopril Other (See Comments)    angioedema   Cefuroxime     Other reaction(s): anaphylaxis   Sulfacetamide Sodium-Sulfur     Other reaction(s): itch   Allopurinol Nausea Only and Other (See Comments)    weakness   Ativan [Lorazepam] Itching   Sulfa Antibiotics Itching   Valium [Diazepam] Other (See Comments)    Patient states that diazepam doesn't relax, it has the opposite effect.     Social History   Socioeconomic History   Marital status: Married    Spouse name: Not on file   Number of children: 2   Years of education: 30   Highest education level: Master's degree (e.g., MA, MS, MEng, MEd, MSW, MBA)  Occupational History   Occupation: retired  Tobacco Use   Smoking status: Some Days    Current packs/day: 0.00    Average packs/day: 0.1 packs/day for 26.0 years (2.6 ttl pk-yrs)    Types: Cigarettes    Start date: 03/24/1995    Last attempt to quit: 03/23/2021    Years since quitting: 2.0   Smokeless tobacco: Never  Vaping Use   Vaping status: Never Used  Substance and Sexual Activity   Alcohol use: No   Drug use: No   Sexual activity: Yes  Other Topics Concern   Not on file  Social History Narrative   Tobacco Use Cigarettes: Former Smoker, Quit in 2008   No Alcohol   No recreational drug use   Diet: Regular/Low Carb   Exercise: None   Occupation: disabled   Education: Company secretary, masters   Children: 2   Firearms: No   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    Right  handed   Two story home      Social Determinants of Health   Financial Resource Strain: Not on file  Food Insecurity: No Food Insecurity (08/11/2022)  Hunger Vital Sign    Worried About Running Out of Food in the Last Year: Never true    Ran Out of Food in the Last Year: Never true  Transportation Needs: No Transportation Needs (08/11/2022)   PRAPARE - Administrator, Civil Service (Medical): No    Lack of Transportation (Non-Medical): No  Physical Activity: Not on file  Stress: No Stress Concern Present (08/11/2022)   [name] of Occupational Health - Occupational Stress Questionnaire    Feeling of Stress : Only a little  Social Connections: Not on file  Intimate Partner Violence: Not At Risk (07/27/2022)   Humiliation, Afraid, Rape, and Kick questionnaire    Fear of Current or Ex-Partner: No    Emotionally Abused: No    Physically Abused: No    Sexually Abused: No    Physical Exam      Future Appointments  Date Time Provider Department Center  03/31/2023 12:30 PM [name] D, LCSW [facility] None  04/27/2023  7:00 AM [facility] DEVICE REMOTES [facility]  06/02/2023  1:45 PM Freddie Breech, DPM [facility]  06/02/2023  2:00 PM Sheron Nightingale, RD [facility] NDM  06/10/2023  3:20 PM Arfeen, Phillips Grout, MD [facility] None  06/14/2023  7:10 AM [facility] DEVICE REMOTES [facility]  06/29/2023  1:30 PM Nita Sickle K, DO [facility] None  09/13/2023  7:10 AM [facility] DEVICE REMOTES [facility]  12/22/2023  8:30 AM Rosann Auerbach, PhD [facility] None  12/22/2023  9:30 AM [facility]- [facility]UR[facility] TECH [facility] None  12/30/2023 10:00 AM Rosann Auerbach, PhD [facility] None

## 2023-03-30 ENCOUNTER — Other Ambulatory Visit (HOSPITAL_COMMUNITY): Payer: Self-pay | Admitting: Emergency Medicine

## 2023-03-30 NOTE — Progress Notes (Signed)
Added Torsemide and namenda to her pill box that just arrived from delivery today.   [name] Setting EMT-P Community Paramedic  [phone]

## 2023-03-31 ENCOUNTER — Ambulatory Visit: Payer: Self-pay | Admitting: Licensed Clinical Social Worker

## 2023-03-31 NOTE — Progress Notes (Signed)
Remote ICD transmission.   

## 2023-04-01 NOTE — Patient Instructions (Signed)
Visit Information  Thank you for taking time to visit with me today. Please don't hesitate to contact me if I can be of assistance to you.   Following are the goals we discussed today:   Goals Addressed             This Visit's Progress    Obtain Supportive Resources   On track    Activities and task to complete in order to accomplish goals.   Keep all upcoming appointments discussed today Continue with compliance of taking medication prescribed by Doctor Implement healthy coping skills discussed to assist with management of symptoms         Our next appointment is by telephone on 11/13 at 10 AM  Please call the care guide team at [phone] if you need to cancel or reschedule your appointment.   If you are experiencing a Mental Health or Behavioral Health Crisis or need someone to talk to, please call the Suicide and Crisis Lifeline: 988 call 911   The patient verbalized understanding of instructions, educational materials, and care plan provided today and DECLINED offer to receive copy of patient instructions, educational materials, and care plan.   [name], MSW, LCSW [facility] Care Management [place]  [facility] [email or web address] Phone [phone] 5:50 AM

## 2023-04-01 NOTE — Patient Outreach (Signed)
  Care Coordination   Initial Visit Note   03/31/2023 Name: Rhonda [name] MRN: [id number] DOB: [birth year]  Rhonda [name] is a 72 y.o. year old female who sees Rhonda [name], Rhonda Score, MD for primary care. I spoke with  Rhonda [name]'s spouse by phone today.  What matters to the patients health and wellness today?  Supportive Resources, Level of Care    Goals Addressed             This Visit's Progress    Obtain Supportive Resources   On track    Activities and task to complete in order to accomplish goals.   Keep all upcoming appointments discussed today Continue with compliance of taking medication prescribed by Doctor Implement healthy coping skills discussed to assist with management of symptoms         SDOH assessments and interventions completed:  Yes  SDOH Interventions Today    Flowsheet Row Most Recent Value  SDOH Interventions   Food Insecurity Interventions Intervention Not Indicated  Housing Interventions Intervention Not Indicated  Transportation Interventions Intervention Not Indicated        Care Coordination Interventions:  Yes, provided  Interventions Today    Flowsheet Row Most Recent Value  Chronic Disease   Chronic disease during today's visit Hypertension (HTN), Other  [Dementia, MDD, GAD]  General Interventions   General Interventions Discussed/Reviewed General Interventions Discussed, [facility], Doctor Visits, Level of Care  [Pt participates in [employer], they are happy with the amount of support [name] provides. Pt receives aid services through Social Service program where aid visits 2 x weekly Tues/Thurs for 2 hrs]  Doctor Visits Discussed/Reviewed Doctor Visits Reviewed  Level of Care Adult Daycare  [Family interested in [facility]. Has toured facility and they are in process of applying for Medicaid with their assistance. Additional options discussed]  Mental Health Interventions   Mental Health Discussed/Reviewed Mental  Health Discussed, Coping Strategies, Anxiety, Depression  Nutrition Interventions   Nutrition Discussed/Reviewed Nutrition Discussed  Pharmacy Interventions   Pharmacy Dicussed/Reviewed Pharmacy Topics Discussed, Medication Adherence  Safety Interventions   Safety Discussed/Reviewed Safety Discussed  [Strategies to promote safety in home and supervision discussed]       Follow up plan: Follow up call scheduled for 1-2 weeks    Encounter Outcome:  Patient Visit Completed   Rhonda [name], MSW, LCSW [facility] Care Management [facility] Health  [facility] [email or web address] Phone [phone] 5:49 AM

## 2023-04-05 ENCOUNTER — Encounter (HOSPITAL_COMMUNITY): Payer: Self-pay | Admitting: Emergency Medicine

## 2023-04-07 ENCOUNTER — Telehealth (HOSPITAL_COMMUNITY): Payer: Self-pay | Admitting: Emergency Medicine

## 2023-04-07 ENCOUNTER — Ambulatory Visit: Payer: Self-pay | Admitting: Licensed Clinical Social Worker

## 2023-04-07 NOTE — Telephone Encounter (Signed)
Ed, Zineb's husband, contacted me confused about directions from PCP.  I was able to get in touch with Dr. Christena Flake nurse today and was told that Narda Amber had "Increased gout levels" and Dr. Chanetta Marshall wanted to start her on Febuxostat.   I contacted pharmacy to confirm they had received the prescription. Pharmacy required a prior auth and message was left on Shirley's VM.   Will continue to follow up on the status of medication to ensure compliance.   [name] Setting EMT-P Community Paramedic  [phone]

## 2023-04-09 NOTE — Patient Outreach (Signed)
  Care Coordination   Follow Up Visit Note   04/07/2023 Name: Rhonda [name] MRN: [id number] DOB: Oct 01, 1950  Rhonda [name] is a 72 y.o. year old female who sees Rhonda [name], Rhonda Score, MD for primary care. I spoke with  Rhonda [name]'s spouse by phone today.  What matters to the patients health and wellness today?  Supportive Resources and Symptom Management    Goals Addressed             This Visit's Progress    Obtain Supportive Resources   On track    Activities and task to complete in order to accomplish goals.   Keep all upcoming appointments discussed today Continue with compliance of taking medication prescribed by Doctor Implement healthy coping skills discussed to assist with management of symptoms Provide required ppwk to Rhonda [name]; who will continue to assist family with applying for Medicaid         SDOH assessments and interventions completed:  No     Care Coordination Interventions:  Yes, provided  Interventions Today    Flowsheet Row Most Recent Value  Chronic Disease   Chronic disease during today's visit Hypertension (HTN), Other  [Dementia, MDD, GAD]  General Interventions   General Interventions Discussed/Reviewed General Interventions Reviewed, [facility], Doctor Visits, Level of Care  Doctor Visits Discussed/Reviewed Doctor Visits Reviewed  Level of Care Applications  Applications Medicaid  Education Interventions   Applications Medicaid  Mental Health Interventions   Mental Health Discussed/Reviewed Mental Health Reviewed       Follow up plan: Follow up call scheduled for 2-4 weeks    Encounter Outcome:  Patient Visit Completed   [name], MSW, LCSW [facility] Care Management [facility] Health  [facility] [email or web address] Phone [phone] 6:27 AM

## 2023-04-09 NOTE — Patient Instructions (Signed)
Visit Information  Thank you for taking time to visit with me today. Please don't hesitate to contact me if I can be of assistance to you.   Following are the goals we discussed today:   Goals Addressed             This Visit's Progress    Obtain Supportive Resources   On track    Activities and task to complete in order to accomplish goals.   Keep all upcoming appointments discussed today Continue with compliance of taking medication prescribed by Doctor Implement healthy coping skills discussed to assist with management of symptoms Provide required ppwk to [facility]; who will continue to assist family with applying for Medicaid         Our next appointment is by telephone on 12/4 at 11:30 AM  Please call the care guide team at [phone] if you need to cancel or reschedule your appointment.   If you are experiencing a Mental Health or Behavioral Health Crisis or need someone to talk to, please call the Suicide and Crisis Lifeline: 988 call 911   The patient verbalized understanding of instructions, educational materials, and care plan provided today and DECLINED offer to receive copy of patient instructions, educational materials, and care plan.   [name], MSW, LCSW [facility] Care Management [place]  [facility] [email or web address] Phone [phone] 6:28 AM

## 2023-04-10 ENCOUNTER — Other Ambulatory Visit (HOSPITAL_COMMUNITY): Payer: Self-pay | Admitting: Physician Assistant

## 2023-04-12 ENCOUNTER — Other Ambulatory Visit (HOSPITAL_COMMUNITY): Payer: Self-pay | Admitting: Emergency Medicine

## 2023-04-12 NOTE — Progress Notes (Signed)
Paramedicine Encounter    Patient ID: Rhonda [name], female    DOB: 07-01-1950, 72 y.o.   MRN: [id number]   Complaints - sore throat, swelling to legs.    Assessment - +2 pitting edema, lung sounds clear.   Compliance with meds - 1 missed lunch dose    Pill box filled - for 1x week   Refills needed - spironolactone   Meds changes since last visit - still waiting to add Uloric, waiting on PCP to preauth at pharmacy.     Social changes - none    VISIT SUMMARY**  I met with Rhonda [name] in the home today. Pt reported a sore throat with a "cloudy" speutum from a productive cough starting Thursday. Pt denied being evaluated for same and may reach out if it does not improve by the end of the week. Pt denied any fever. Pt denied any chest pain, palpitations, shortness of breath, or dizziness. Continual gradual weight gain noted, but pt continues to drink and eat out side of nutritional boundaries. Soup noted on table today and pt was reminded of the sodium content in that single can and educated on some healthier ways to alleviate a sore throat. Pt was agreeable, but easily forgets recommendations. Pt's pill box was filled for 1x week. Upcoming appointments reviewed. Will follow up in 1x week.   BP 120/60   Pulse 86   Resp 16   Wt 206 lb 9.6 oz (93.7 kg)   SpO2 92%   BMI 38.40 kg/m  Weight yesterday- DNW Last visit weight-204.1 lbs  [name] [employer]-P Community Paramedic  [phone]     ACTION: Home visit completed     Patient Care Team: Shon Hale, MD as PCP - General (Family Medicine) Burna Sis, LCSW as Social Worker (Licensed Clinical Social Worker) [name], Noberto Retort, DO as Consulting Physician (Neurology) [name], Paramedic as Paramedic Chanetta Marshall, Meridee Score, MD as Attending Physician (Family Medicine) Clinton Gallant, RN as [facility] Care Management Bensimhon, Bevelyn Buckles, MD as Consulting Physician (Cardiology) Lolly Mustache, Phillips Grout, MD  as Consulting Physician (Psychiatry) Candelaria Stagers, DPM as Consulting Physician (Podiatry)  Patient Active Problem List   Diagnosis Date Noted   Major depressive disorder, recurrent episode, severe (HCC) 07/27/2022   MDD (major depressive disorder), recurrent severe, without psychosis (HCC) 07/27/2022   MDD (major depressive disorder) 07/27/2022   Elevated troponin 12/02/2021   Nausea & vomiting 12/01/2021   Acute left ankle pain 10/11/2021   Intractable nausea and vomiting 10/08/2021   AKI (acute kidney injury) (HCC) 10/08/2021   Hyponatremia 10/08/2021   Decreased estrogen level 08/01/2021   Dementia (HCC) 08/01/2021   Frail elderly 08/01/2021   Hyperparathyroidism (HCC) 08/01/2021   Spondylolisthesis 08/01/2021   Varicose veins of bilateral lower extremities with other complications 08/01/2021   Lumbago without sciatica 04/30/2021   Abnormal gait 06/11/2020   Adult failure to thrive syndrome 06/11/2020   Allergic rhinitis 06/11/2020   Anxiety 06/11/2020   Asthma 06/11/2020   Benign intracranial hypertension 06/11/2020   Body mass index (BMI) 45.0-49.9, adult (HCC) 06/11/2020   Bowel incontinence 06/11/2020   Cholelithiasis 06/11/2020   Chronic sinusitis 06/11/2020   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   Daytime somnolence 06/11/2020   Diarrhea of presumed infectious origin 06/11/2020   Edema 06/11/2020   Family history of malignant neoplasm of gastrointestinal tract 06/11/2020   Gout 06/11/2020   History of fall 06/11/2020   History of infectious disease 06/11/2020   Insomnia 06/11/2020  Irregular bowel habits 06/11/2020   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   Lumbar spondylosis with myelopathy 06/11/2020   Macrocytosis 06/11/2020   Mild recurrent major depression (HCC) 06/11/2020   History of colonic polyps 06/11/2020   Personal history of malignant neoplasm of breast 06/11/2020   Repeated falls 06/11/2020   Mixed anxiety and depressive disorder 06/11/2020   Irritable bowel  syndrome 01/04/2020   Spinal stenosis of lumbar region 01/03/2020   Other symptoms and signs involving the musculoskeletal system 09/11/2019   Bilateral leg weakness 08/01/2019   Leg swelling 08/01/2019   Diabetes mellitus with neuropathy (HCC) 06/15/2019   Hypokalemia 11/08/2018   CKD (chronic kidney disease) stage 3, GFR 30-59 ml/min (HCC) 11/08/2018   Orthostatic hypotension 07/28/2017   SVD (spontaneous vaginal delivery)    Peripheral neuropathy    On home oxygen therapy    Migraines    Left bundle branch block    Hypothyroidism    Hypertension    Hyperlipidemia    Heart murmur    GERD (gastroesophageal reflux disease)    Exertional shortness of breath    Major depressive disorder    Back pain    Arthritis    Generalized anxiety disorder    Anemia    Chronic respiratory failure (HCC) 09/14/2013   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   Morbid obesity (HCC) 11/01/2012   Chronic systolic heart failure (HCC) 10/27/2012   Endometrial polyp 01/20/2012   Malignant tumor of breast (HCC) 03/26/2011   Vitamin D deficiency 03/26/2011    Current Outpatient Medications:    acetaZOLAMIDE (DIAMOX) 125 MG tablet, Take 1 tablet (125 mg total) by mouth daily., Disp: 90 tablet, Rfl: 3   Ascorbic Acid (VITAMIN C) 1000 MG tablet, Take 1,000 mg by mouth daily., Disp: , Rfl:    buPROPion (WELLBUTRIN XL) 150 MG 24 hr tablet, Take 1 tablet (150 mg total) by mouth daily., Disp: 30 tablet, Rfl: 2   carvedilol (COREG) 3.125 MG tablet, TAKE 1 TABLET (3.125 MG) BY MOUTH TWICE DAILY WITH MEALS, Disp: 180 tablet, Rfl: 3   citalopram (CELEXA) 20 MG tablet, Take 1 tablet (20 mg total) by mouth daily., Disp: 30 tablet, Rfl: 2   clonazePAM (KLONOPIN) 0.5 MG tablet, Take 1 tablet (0.5 mg total) by mouth 2 (two) times daily., Disp: 60 tablet, Rfl: 2   cyanocobalamin (VITAMIN B12) 1000 MCG tablet, Take 1,000 mcg by mouth daily., Disp: , Rfl:    desloratadine  (CLARINEX) 5 MG tablet, Take 1 tablet (5 mg total) by mouth daily., Disp: 90 tablet, Rfl: 3   dicyclomine (BENTYL) 20 MG tablet, Take 20 mg by mouth 3 (three) times daily before meals., Disp: , Rfl:    fluticasone (FLONASE) 50 MCG/ACT nasal spray, Place 1 spray into both nostrils daily., Disp: , Rfl:    gabapentin (NEURONTIN) 100 MG capsule, TAKE 1 CAPSULE (100 MG TOTAL) BY MOUTH 2 (TWO) TIMES DAILY., Disp: 180 capsule, Rfl: 0   levothyroxine (SYNTHROID) 88 MCG tablet, TAKE 1 TABLET(88 MCG) BY MOUTH DAILY BEFORE BREAKFAST, Disp: 90 tablet, Rfl: 3   memantine (NAMENDA) 5 MG tablet, Take 1 tablet (5 mg total) by mouth daily., Disp: 30 tablet, Rfl: 3   metolazone (ZAROXOLYN) 2.5 MG tablet, TAKE 1 TABLET BY MOUTH EVERY OTHER TUESDAY., Disp: 12 tablet, Rfl: 3   midodrine (PROAMATINE) 5 MG tablet, Take 1 tablet (5 mg total) by mouth 3 (three) times daily., Disp: 200 tablet, Rfl: 3   Multiple Vitamin (MULTIVITAMIN WITH MINERALS) TABS tablet, Take  1 tablet by mouth daily., Disp: , Rfl:    Ondansetron HCl (ZOFRAN PO), 4 mg. Take 1 tablet on the tongue and allow to dissolve, Disp: , Rfl:    potassium chloride SA (KLOR-CON M) 20 MEQ tablet, TAKE 2 TABLETS THREE TIMES DAILY. TAKE AN EXTRA 2 TABLETS ON DAYS YOU TAKE METOLAZONE, Disp: 572 tablet, Rfl: 3   risperiDONE (RISPERDAL) 0.5 MG tablet, Take 1 tablet (0.5 mg total) by mouth 2 (two) times daily at 8 am and 4 pm., Disp: 60 tablet, Rfl: 2   simvastatin (ZOCOR) 10 MG tablet, TAKE 1 TABLET EVERY EVENING, Disp: 90 tablet, Rfl: 3   spironolactone (ALDACTONE) 25 MG tablet, TAKE 1 TABLET (25 MG TOTAL) BY MOUTH EVERY EVENING., Disp: 90 tablet, Rfl: 3   torsemide (DEMADEX) 20 MG tablet, Take 4 tablets (80 mg total) by mouth 2 (two) times daily., Disp: 240 tablet, Rfl: 6   azelastine (ASTELIN) 0.1 % nasal spray, Place 2 sprays into both nostrils 2 (two) times daily. Use in each nostril as directed (Patient not taking: Reported on 04/12/2023), Disp: 30 mL, Rfl:  12 Allergies  Allergen Reactions   Ceftin Anaphylaxis    Face and throat swell    Cefuroxime Axetil Anaphylaxis    Face and throat swell   Geodon [Ziprasidone Hcl] Hives   Lisinopril Other (See Comments)    angioedema   Cefuroxime     Other reaction(s): anaphylaxis   Sulfacetamide Sodium-Sulfur     Other reaction(s): itch   Allopurinol Nausea Only and Other (See Comments)    weakness   Ativan [Lorazepam] Itching   Sulfa Antibiotics Itching   Valium [Diazepam] Other (See Comments)    Patient states that diazepam doesn't relax, it has the opposite effect.     Social History   Socioeconomic History   Marital status: Married    Spouse name: Not on file   Number of children: 2   Years of education: 8   Highest education level: Master's degree (e.g., MA, MS, MEng, MEd, MSW, MBA)  Occupational History   Occupation: retired  Tobacco Use   Smoking status: Some Days    Current packs/day: 0.00    Average packs/day: 0.1 packs/day for 26.0 years (2.6 ttl pk-yrs)    Types: Cigarettes    Start date: 03/24/1995    Last attempt to quit: 03/23/2021    Years since quitting: 2.0   Smokeless tobacco: Never  Vaping Use   Vaping status: Never Used  Substance and Sexual Activity   Alcohol use: No   Drug use: No   Sexual activity: Yes  Other Topics Concern   Not on file  Social History Narrative   Tobacco Use Cigarettes: Former Smoker, Quit in 2008   No Alcohol   No recreational drug use   Diet: Regular/Low Carb   Exercise: None   Occupation: disabled   Education: Company secretary, masters   Children: 2   Firearms: No   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      Social Determinants of Health   Financial Resource Strain: Not on file  Food Insecurity: No Food Insecurity (03/31/2023)   Hunger Vital Sign    Worried About Running Out of Food in the Last Year: Never true    Ran Out of Food in the Last Year: Never true  Transportation Needs:  No Transportation Needs (03/31/2023)   PRAPARE - Transportation    Lack of  Transportation (Medical): No    Lack of Transportation (Non-Medical): No  Physical Activity: Not on file  Stress: No Stress Concern Present (08/11/2022)   [name] of Occupational Health - Occupational Stress Questionnaire    Feeling of Stress : Only a little  Social Connections: Not on file  Intimate Partner Violence: Not At Risk (07/27/2022)   Humiliation, Afraid, Rape, and Kick questionnaire    Fear of Current or Ex-Partner: No    Emotionally Abused: No    Physically Abused: No    Sexually Abused: No    Physical Exam      Future Appointments  Date Time Provider Department Center  04/20/2023  1:00 PM Rosann Auerbach, PhD [facility] None  04/20/2023  2:00 PM [facility]UR[facility] TECH [facility] None  04/27/2023  7:00 AM [facility] DEVICE REMOTES [facility]  04/27/2023  2:30 PM Rosann Auerbach, PhD [facility] None  04/28/2023 11:30 AM [name] D, LCSW [facility] None  06/02/2023  1:45 PM Freddie Breech, DPM [facility]  06/02/2023  2:00 PM Sheron Nightingale, RD [facility]  06/10/2023  3:20 PM Arfeen, Phillips Grout, MD [facility] None  06/14/2023  7:10 AM [facility] DEVICE REMOTES [facility]  06/29/2023  1:30 PM Nita Sickle K, DO [facility] None  09/13/2023  7:10 AM [facility] DEVICE REMOTES [facility]

## 2023-04-19 ENCOUNTER — Other Ambulatory Visit (HOSPITAL_COMMUNITY): Payer: Self-pay | Admitting: Emergency Medicine

## 2023-04-19 NOTE — Progress Notes (Signed)
Paramedicine Encounter    Patient ID: Rhonda [name], female    DOB: 1951/01/31, 72 y.o.   MRN: [id number]   Complaints - "off balanced" feeling, denied dizziness. Feels fatigued (reports illness last week)  Assessment - lung sounds clear  Compliance with meds - missed 4x noon doses and 1 evening dose  Pill box filled - for 1x week   Refills needed - spironolactone, torsemide  Meds changes since last visit - none    Social changes - none   VISIT SUMMARY**  I met with Rhonda [name] In her home today. Pt reported that she has been feeling tired and off balanced recently. Pt was noted to have decreased medication compliance this week. Pt was encouraged and educated on why she needed to be compliant. Pt reported that she would do better. When asked about food, pt continues to say that she is eating soup despite nutritional education. Pt's pill box was filled for 1x week. Upcoming appointments reviewed. Will follow up in 1x week.   BP 120/64   Pulse 82   Resp 16   Wt 210 lb 1.6 oz (95.3 kg)   SpO2 98%   BMI 39.06 kg/m  Weight yesterday-DNW Last visit weight-206 lbs   [name] [facility] EMT-P Community Paramedic  [phone]     ACTION: Home visit completed     Patient Care Team: Shon Hale, MD as PCP - General (Family Medicine) Burna Sis, LCSW as Social Worker (Licensed Clinical Social Worker) [name], Noberto Retort, DO as Consulting Physician (Neurology) [name], Paramedic as Paramedic Chanetta Marshall, Meridee Score, MD as Attending Physician (Family Medicine) Clinton Gallant, RN as [facility] Care Management Bensimhon, Bevelyn Buckles, MD as Consulting Physician (Cardiology) Lolly Mustache Phillips Grout, MD as Consulting Physician (Psychiatry) Candelaria Stagers, DPM as Consulting Physician (Podiatry)  Patient Active Problem List   Diagnosis Date Noted   Major depressive disorder, recurrent episode, severe (HCC) 07/27/2022   MDD (major depressive disorder), recurrent  severe, without psychosis (HCC) 07/27/2022   MDD (major depressive disorder) 07/27/2022   Elevated troponin 12/02/2021   Nausea & vomiting 12/01/2021   Acute left ankle pain 10/11/2021   Intractable nausea and vomiting 10/08/2021   AKI (acute kidney injury) (HCC) 10/08/2021   Hyponatremia 10/08/2021   Decreased estrogen level 08/01/2021   Dementia (HCC) 08/01/2021   Frail elderly 08/01/2021   Hyperparathyroidism (HCC) 08/01/2021   Spondylolisthesis 08/01/2021   Varicose veins of bilateral lower extremities with other complications 08/01/2021   Lumbago without sciatica 04/30/2021   Abnormal gait 06/11/2020   Adult failure to thrive syndrome 06/11/2020   Allergic rhinitis 06/11/2020   Anxiety 06/11/2020   Asthma 06/11/2020   Benign intracranial hypertension 06/11/2020   Body mass index (BMI) 45.0-49.9, adult (HCC) 06/11/2020   Bowel incontinence 06/11/2020   Cholelithiasis 06/11/2020   Chronic sinusitis 06/11/2020   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   Daytime somnolence 06/11/2020   Diarrhea of presumed infectious origin 06/11/2020   Edema 06/11/2020   Family history of malignant neoplasm of gastrointestinal tract 06/11/2020   Gout 06/11/2020   History of fall 06/11/2020   History of infectious disease 06/11/2020   Insomnia 06/11/2020   Irregular bowel habits 06/11/2020   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   Lumbar spondylosis with myelopathy 06/11/2020   Macrocytosis 06/11/2020   Mild recurrent major depression (HCC) 06/11/2020   History of colonic polyps 06/11/2020   Personal history of malignant neoplasm of breast 06/11/2020   Repeated falls 06/11/2020   Mixed anxiety and depressive  disorder 06/11/2020   Irritable bowel syndrome 01/04/2020   Spinal stenosis of lumbar region 01/03/2020   Other symptoms and signs involving the musculoskeletal system 09/11/2019   Bilateral leg weakness 08/01/2019   Leg swelling 08/01/2019   Diabetes mellitus with neuropathy (HCC) 06/15/2019    Hypokalemia 11/08/2018   CKD (chronic kidney disease) stage 3, GFR 30-59 ml/min (HCC) 11/08/2018   Orthostatic hypotension 07/28/2017   SVD (spontaneous vaginal delivery)    Peripheral neuropathy    On home oxygen therapy    Migraines    Left bundle branch block    Hypothyroidism    Hypertension    Hyperlipidemia    Heart murmur    GERD (gastroesophageal reflux disease)    Exertional shortness of breath    Major depressive disorder    Back pain    Arthritis    Generalized anxiety disorder    Anemia    Chronic respiratory failure (HCC) 09/14/2013   Biventricular ICD (implantable cardioverter-defibrillator) in place 08/04/2013   Morbid obesity (HCC) 11/01/2012   Chronic systolic heart failure (HCC) 10/27/2012   Endometrial polyp 01/20/2012   Malignant tumor of breast (HCC) 03/26/2011   Vitamin D deficiency 03/26/2011    Current Outpatient Medications:    acetaZOLAMIDE (DIAMOX) 125 MG tablet, Take 1 tablet (125 mg total) by mouth daily., Disp: 90 tablet, Rfl: 3   Ascorbic Acid (VITAMIN C) 1000 MG tablet, Take 1,000 mg by mouth daily., Disp: , Rfl:    azelastine (ASTELIN) 0.1 % nasal spray, Place 2 sprays into both nostrils 2 (two) times daily. Use in each nostril as directed, Disp: 30 mL, Rfl: 12   buPROPion (WELLBUTRIN XL) 150 MG 24 hr tablet, Take 1 tablet (150 mg total) by mouth daily., Disp: 30 tablet, Rfl: 2   carvedilol (COREG) 3.125 MG tablet, TAKE 1 TABLET (3.125 MG) BY MOUTH TWICE DAILY WITH MEALS, Disp: 180 tablet, Rfl: 3   citalopram (CELEXA) 20 MG tablet, Take 1 tablet (20 mg total) by mouth daily., Disp: 30 tablet, Rfl: 2   clonazePAM (KLONOPIN) 0.5 MG tablet, Take 1 tablet (0.5 mg total) by mouth 2 (two) times daily., Disp: 60 tablet, Rfl: 2   cyanocobalamin (VITAMIN B12) 1000 MCG tablet, Take 1,000 mcg by mouth daily., Disp: , Rfl:    desloratadine (CLARINEX) 5 MG tablet, Take 1 tablet (5 mg total) by mouth daily., Disp: 90 tablet, Rfl: 3   dicyclomine (BENTYL) 20 MG  tablet, Take 20 mg by mouth 3 (three) times daily before meals., Disp: , Rfl:    fluticasone (FLONASE) 50 MCG/ACT nasal spray, Place 1 spray into both nostrils daily., Disp: , Rfl:    gabapentin (NEURONTIN) 100 MG capsule, TAKE 1 CAPSULE (100 MG TOTAL) BY MOUTH 2 (TWO) TIMES DAILY., Disp: 180 capsule, Rfl: 0   levothyroxine (SYNTHROID) 88 MCG tablet, TAKE 1 TABLET(88 MCG) BY MOUTH DAILY BEFORE BREAKFAST, Disp: 90 tablet, Rfl: 3   memantine (NAMENDA) 5 MG tablet, Take 1 tablet (5 mg total) by mouth daily., Disp: 30 tablet, Rfl: 3   metolazone (ZAROXOLYN) 2.5 MG tablet, TAKE 1 TABLET BY MOUTH EVERY OTHER TUESDAY., Disp: 12 tablet, Rfl: 3   midodrine (PROAMATINE) 5 MG tablet, Take 1 tablet (5 mg total) by mouth 3 (three) times daily., Disp: 200 tablet, Rfl: 3   Multiple Vitamin (MULTIVITAMIN WITH MINERALS) TABS tablet, Take 1 tablet by mouth daily., Disp: , Rfl:    Ondansetron HCl (ZOFRAN PO), 4 mg. Take 1 tablet on the tongue and allow to dissolve,  Disp: , Rfl:    potassium chloride SA (KLOR-CON M) 20 MEQ tablet, TAKE 2 TABLETS THREE TIMES DAILY. TAKE AN EXTRA 2 TABLETS ON DAYS YOU TAKE METOLAZONE, Disp: 572 tablet, Rfl: 3   risperiDONE (RISPERDAL) 0.5 MG tablet, Take 1 tablet (0.5 mg total) by mouth 2 (two) times daily at 8 am and 4 pm., Disp: 60 tablet, Rfl: 2   simvastatin (ZOCOR) 10 MG tablet, TAKE 1 TABLET EVERY EVENING, Disp: 90 tablet, Rfl: 3   spironolactone (ALDACTONE) 25 MG tablet, TAKE 1 TABLET (25 MG TOTAL) BY MOUTH EVERY EVENING., Disp: 90 tablet, Rfl: 3   torsemide (DEMADEX) 20 MG tablet, Take 4 tablets (80 mg total) by mouth 2 (two) times daily., Disp: 240 tablet, Rfl: 6 Allergies  Allergen Reactions   Ceftin Anaphylaxis    Face and throat swell    Cefuroxime Axetil Anaphylaxis    Face and throat swell   Geodon [Ziprasidone Hcl] Hives   Lisinopril Other (See Comments)    angioedema   Cefuroxime     Other reaction(s): anaphylaxis   Sulfacetamide Sodium-Sulfur     Other  reaction(s): itch   Allopurinol Nausea Only and Other (See Comments)    weakness   Ativan [Lorazepam] Itching   Sulfa Antibiotics Itching   Valium [Diazepam] Other (See Comments)    Patient states that diazepam doesn't relax, it has the opposite effect.     Social History   Socioeconomic History   Marital status: Married    Spouse name: Not on file   Number of children: 2   Years of education: 67   Highest education level: Master's degree (e.g., MA, MS, MEng, MEd, MSW, MBA)  Occupational History   Occupation: retired  Tobacco Use   Smoking status: Some Days    Current packs/day: 0.00    Average packs/day: 0.1 packs/day for 26.0 years (2.6 ttl pk-yrs)    Types: Cigarettes    Start date: 03/24/1995    Last attempt to quit: 03/23/2021    Years since quitting: 2.0   Smokeless tobacco: Never  Vaping Use   Vaping status: Never Used  Substance and Sexual Activity   Alcohol use: No   Drug use: No   Sexual activity: Yes  Other Topics Concern   Not on file  Social History Narrative   Tobacco Use Cigarettes: Former Smoker, Quit in 2008   No Alcohol   No recreational drug use   Diet: Regular/Low Carb   Exercise: None   Occupation: disabled   Education: Company secretary, masters   Children: 2   Firearms: No   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      Social Determinants of Health   Financial Resource Strain: Not on file  Food Insecurity: No Food Insecurity (03/31/2023)   Hunger Vital Sign    Worried About Running Out of Food in the Last Year: Never true    Ran Out of Food in the Last Year: Never true  Transportation Needs: No Transportation Needs (03/31/2023)   PRAPARE - Administrator, Civil Service (Medical): No    Lack of Transportation (Non-Medical): No  Physical Activity: Not on file  Stress: No Stress Concern Present (08/11/2022)   [name] of Occupational Health - Occupational Stress Questionnaire     Feeling of Stress : Only a little  Social Connections: Not on file  Intimate Partner Violence: Not At Risk (07/27/2022)   Humiliation,  Afraid, Rape, and Kick questionnaire    Fear of Current or Ex-Partner: No    Emotionally Abused: No    Physically Abused: No    Sexually Abused: No    Physical Exam      Future Appointments  Date Time Provider Department Center  04/20/2023  1:00 PM Rosann Auerbach, PhD [facility] None  04/20/2023  2:00 PM [facility]- NEUROPSYCH TECH [facility] None  04/27/2023  7:00 AM [facility] DEVICE REMOTES [facility]  04/27/2023  2:30 PM Rosann Auerbach, PhD [facility] None  04/28/2023 11:30 AM [name] D, LCSW [facility] None  06/02/2023  1:45 PM Freddie Breech, DPM [facility]  06/02/2023  2:00 PM Sheron Nightingale, RD [facility]  06/10/2023  3:20 PM Arfeen, Phillips Grout, MD [facility] None  06/14/2023  7:10 AM [facility] DEVICE REMOTES [facility]  06/29/2023  1:30 PM Nita Sickle K, DO [facility] None  09/13/2023  7:10 AM [facility] DEVICE REMOTES [facility]

## 2023-04-20 ENCOUNTER — Ambulatory Visit: Payer: Medicare HMO | Admitting: Psychology

## 2023-04-20 ENCOUNTER — Encounter: Payer: Self-pay | Admitting: Psychology

## 2023-04-20 DIAGNOSIS — F03A3 Unspecified dementia, mild, with mood disturbance: Secondary | ICD-10-CM

## 2023-04-20 DIAGNOSIS — F03A0 Unspecified dementia, mild, without behavioral disturbance, psychotic disturbance, mood disturbance, and anxiety: Secondary | ICD-10-CM

## 2023-04-20 DIAGNOSIS — E1140 Type 2 diabetes mellitus with diabetic neuropathy, unspecified: Secondary | ICD-10-CM | POA: Insufficient documentation

## 2023-04-20 DIAGNOSIS — R4189 Other symptoms and signs involving cognitive functions and awareness: Secondary | ICD-10-CM

## 2023-04-20 DIAGNOSIS — G932 Benign intracranial hypertension: Secondary | ICD-10-CM

## 2023-04-20 HISTORY — DX: Unspecified dementia, mild, without behavioral disturbance, psychotic disturbance, mood disturbance, and anxiety: F03.A0

## 2023-04-20 NOTE — Progress Notes (Signed)
NEUROPSYCHOLOGICAL EVALUATION [place]. [facility] Department of Neurology  Date of Evaluation: April 20, 2023  Reason for Referral:   KRISTIANN COTTERMAN is a 72 y.o. right-handed African-American female referred by Nita Sickle, D.O., to characterize her current cognitive functioning and assist with diagnostic clarity and treatment planning in the context of subjective cognitive decline and numerous psychiatric and medical comorbidities.   Assessment and Plan:   Clinical Impression(s): Scores across stand-alone and embedded performance validity measures were variable. There remains the possibility that lower performances are due to true memory impairment and there was no evidence for poor engagement or attempts to perform poorly across testing. While mild caution may be prudent, I do believe that current results represent a reasonably valid representation of Ms. Calvo's current cognitive functioning.  If taken at face value, Ms. Dinneen's pattern of performance is suggestive of significant cognitive impairment across processing speed, executive functioning, confrontation naming, and all aspects of learning and memory. Further performance variability was exhibited across verbal fluency and visuospatial abilities. Basic attention was intact relative to age-matched peers. Functionally, Ms. Balandran's husband noted that he has taken over financial management, that they have outside assistance with medication management and that Ms. Devan will forget to take doses, and that she no longer drives due to cognitive concerns. Given evidence for both cognitive and functional impairment, Ms. Behrmann best meets diagnostic criteria for a Major Neurocognitive Disorder ("dementia") at the present time.  The etiology of her dementia presentation is somewhat unclear. Memory testing does elevate concerns for underlying Alzheimer's disease. She was fully amnestic (i.e., 0% retention) after a brief delay  across both verbal memory tasks and only recalled 11% of a visual memory task. Performances across yes/no recognition trials were also very poor. Taken together, this suggests evidence for rapid forgetting and a prominent storage impairment, both of which are the hallmark testing characteristics of this illness. Further weakness/impairment in confrontation naming and semantic fluency would follow typical disease progression. This remains a plausible explanation for ongoing deficits.  A brain MRI and subsequent lumbar puncture in 2011 suggested mildly elevated intracranial pressure. No symptom improvement was reported after this procedure was completed. Some of Ms. Askren's physical symptoms, including frequent falling behaviors and regular headaches do align with what can be seen in this sort of presentation. This presentation can also create ongoing cognitive impairment. However, prior CT scans have not suggested an easily identifiable cause for this presentation. Furthermore, her degree of amnestic memory would be abnormal and better favors Alzheimer's disease as stated above. Ongoing work-up to better understand the presence of lack thereof elevated intracranial pressure would be reasonable. Frequent falling is a common symptom in progressive supranuclear palsy (PSP); however, she does not exhibit other parkinsonian symptoms to my knowledge, making this less likely. There also remains a potential contribution due to her significant medical history and, at times, quite significant psychiatric distress. However, neuroimaging over the years has not suggested advanced cerebrovascular disease or other abnormalities which could help explain memory loss, tempering the magnitude of the former's contribution to her current presentation. Continued medical monitoring will be important moving forward.   Recommendations: If her implanted ICD is MRI compatible, an updated brain MRI could be considered given that her last  scan appears to have been in 2011 and this type of scan is superior when assessing cerebrovascular disease relative to a head CT.   Ms. Yantz has already been prescribed a medication aimed to address memory loss and concerns  surrounding Alzheimer's disease (i.e., memantine/Namenda). She is encouraged to continue taking this medication as prescribed. It is important to highlight that this medication has been shown to slow functional decline in some individuals. There is no current treatment which can stop or reverse cognitive decline when caused by a neurodegenerative illness.  Ms. Sigur is encouraged to speak with her prescribing physician regarding medication adjustments to optimally manage ongoing psychiatric distress.  Given the degree of cognitive impairment, I agree with her husband/family's decision and would recommend that she continue to fully abstain from all driving behaviors.  It will be important for Ms. Esquer to have another person with her when in situations where she may need to process information, weigh the pros and cons of different options, and make decisions, in order to ensure that she fully understands and recalls all information to be considered.  If not already done, Ms. Concannon and her family may want to discuss her wishes regarding durable power of attorney and medical decision making, so that she can have input into these choices. If they require legal assistance with this, long-term care resource access, or other aspects of estate planning, they could reach out to [employer] at [phone] for a free consultation. Additionally, they may wish to discuss future plans for caretaking and seek out community options for in home/residential care should they become necessary.  Ms. Quirke is encouraged to attend to lifestyle factors for brain health (e.g., regular physical exercise, good nutrition habits and consideration of the MIND-DASH diet, regular participation in  cognitively-stimulating activities, and general stress management techniques), which are likely to have benefits for both emotional adjustment and cognition. In fact, in addition to promoting good general health, regular exercise incorporating aerobic activities (e.g., brisk walking, jogging, cycling, etc.) has been demonstrated to be a very effective treatment for depression and stress, with similar efficacy rates to both antidepressant medication and psychotherapy. Optimal control of vascular risk factors (including safe cardiovascular exercise and adherence to dietary recommendations) is encouraged. Continued participation in activities which provide mental stimulation and social interaction is also recommended.   Important information should be provided to Ms. Merlin in written format in all instances. This information should be placed in a highly frequented and easily visible location within her home to promote recall. External strategies such as written notes in a consistently used memory journal, visual and nonverbal auditory cues such as a calendar on the refrigerator or appointments with alarm, such as on a cell phone, can also help maximize recall.  To address problems with processing speed, she may wish to consider:   -Ensuring that she is alerted when essential material or instructions are being presented   -Adjusting the speed at which new information is presented   -Allowing for more time in comprehending, processing, and responding in conversation   -Repeating and paraphrasing instructions or conversations aloud  To address problems with fluctuating attention and/or executive dysfunction, she may wish to consider:   -Avoiding external distractions when needing to concentrate   -Limiting exposure to fast paced environments with multiple sensory demands   -Writing down complicated information and using checklists   -Attempting and completing one task at a time (i.e., no multi-tasking)    -Verbalizing aloud each step of a task to maintain focus   -Taking frequent breaks during the completion of steps/tasks to avoid fatigue   -Reducing the amount of information considered at one time   -Scheduling more difficult activities for a time of day where she  is usually most alert  Review of Records:   Ms. Frantzen was seen by [facility] Nita Sickle, D.O.) on 03/13/2019 for memory changes and falls. Regarding the latter, falls were said to be occurring with high frequency since November 2017 (generally tripping over objects in her environment and/or losing her balance). These were said to become much worse in 2018, including instances where she would become lightheaded preceding falls, causing her to fall backwards. She was eventually found to be orthostatic, started on relevant medications, and improved somewhat. Regarding cognitive concerns, she reported difficulties with short-term memory and slowed processing speed. ADLs were said to be variable. Ms. Waligorski was able to cook and do laundry, manage medications, and drive short distances at that time. Financial management was aided by her husband. Most recently, her husband reported an increase in cognitive dysfunction (namely memory) over the past 1 year, as well as continued falls (15 reported in the past year). Subsequently, Ms. Bearden was referred for a comprehensive neuropsychological evaluation to characterize her cognitive abilities and to assist with diagnostic clarity and treatment planning.  She completed a comprehensive neuropsychological evaluation with myself on 03/15/2019. Unfortunately, Ms. Bidinger experienced significant fatigue throughout the evaluation and appeared to briefly fall asleep several times during task administration. Scores across stand-alone and embedded performance validity measures were variable, but generally below expectation. While it was believed that Ms. Memmer was experiencing cognitive dysfunction to some degree,  the evaluation was deemed invalid due to fatigue and validity testing. A repeat evaluation was recommended.   She most recently met with Dr. [name] for follow-up on 06/15/2022. At that time, she reported a daily throbbing headache, worse at the vertex, lasting all day. There were no known triggers. She described ongoing concerns surrounding memory and keeping up with day-to-day tasks. She also described severe depression and significant anxiety concerns. Medications were adjusted.  She was briefly seen on 06/29/2022 to attempt a repeat evaluation with myself. At that time, she expressed a lack of desire to complete cognitive testing due to increased stress and diminished sleep the night before. She was rescheduled to re-attempt cognitive testing.   Neuroimaging:  Brain MRI on 08/06/2009 revealed arachnoid herniation into the sella, said to possibly be associated with increased intracranial pressure. A follow-up lumbar puncture showed mildly raised intracranial pressure. However, no changes in her gait or frequency of falls were reported following her spinal tap. Head CT on 07/26/2018 revealed minimal atrophy. Head CT on 08/10/2018 revealed unchanged mild atrophy and bulky parafalcine calcifications. Head CTs on 11/08/2018, 03/11/2019, 12/01/2021, and 07/26/2022 were read as unremarkable.  Past Medical History:  Diagnosis Date   Abnormal gait 06/11/2020   Acute left ankle pain 10/11/2021   AKI (acute kidney injury) 10/08/2021   Allergic rhinitis 06/11/2020   Anemia    Arthritis    Right knee   Asthma 06/11/2020   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   Back pain    Disk problem   Benign intracranial hypertension 06/11/2020   Bilateral leg weakness 08/01/2019   Biventricular ICD (implantable cardioverter-defibrillator) in place 08/04/2013   Body mass index (BMI) 45.0-49.9, adult 06/11/2020   Bowel incontinence 06/11/2020   Cardiomyopathy    Cholelithiasis 06/11/2020   Chronic respiratory failure 09/14/2013    Chronic sinusitis 06/11/2020   Chronic systolic heart failure 10/27/2012   a) NICM b) ECHO (03/2013) EF 20-25% c) ECHO (09/2013) EF 45-50%, grade I DD   CKD (chronic kidney disease) stage 3, GFR 30-59 ml/min 11/08/2018   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  Complication of anesthesia    History of low blood pressure after surgery; attributed to lying flat   Daytime somnolence 06/11/2020   Decreased estrogen level 08/01/2021   Diabetic neuropathy    Diarrhea of presumed infectious origin 06/11/2020   Edema 06/11/2020   Elevated troponin 12/02/2021   Endometrial polyp 01/20/2012   Exertional shortness of breath    Generalized anxiety disorder    GERD (gastroesophageal reflux disease)    Gout 06/11/2020   Heart murmur    History of colonic polyps 06/11/2020   History of fall 06/11/2020   Hyperlipidemia    Hyperparathyroidism 08/01/2021   Hypertension    Hypokalemia 11/08/2018   Hyponatremia 10/08/2021   Hypothyroidism    Insomnia 06/11/2020   Intractable nausea and vomiting 10/08/2021   Irritable bowel syndrome 01/04/2020   Left bundle branch block    s/p CRT-D (04/2013)   Lumbago without sciatica 04/30/2021   Lumbar spondylosis with myelopathy 06/11/2020   Macrocytosis 06/11/2020   Major depressive disorder    Malignant tumor of breast 03/26/2011   Left; completed chemotherapy and radiation treatments   Migraines    On home oxygen therapy    "2L suppose to be q night" (05/03/2013)   Orthostatic hypotension 07/28/2017   Spinal stenosis of lumbar region 01/03/2020   Spondylolisthesis 08/01/2021   SVD (spontaneous vaginal delivery)    x 2   Type II diabetes mellitus 06/15/2019   Varicose veins of bilateral lower extremities with other complications 08/01/2021   Vitamin D deficiency 03/26/2011    Past Surgical History:  Procedure Laterality Date   BI-VENTRICULAR IMPLANTABLE CARDIOVERTER DEFIBRILLATOR N/A 05/03/2013   Procedure: BI-VENTRICULAR IMPLANTABLE CARDIOVERTER DEFIBRILLATOR   (CRT-D);  Surgeon: Marinus Maw, MD;  Location: [facility] CATH LAB;  Service: Cardiovascular;  Laterality: N/A;   BI-VENTRICULAR IMPLANTABLE CARDIOVERTER DEFIBRILLATOR  (CRT-D)  05/03/2013   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   BIOPSY  05/31/2018   Procedure: BIOPSY;  Surgeon: Kerin Salen, MD;  Location: [facility];  Service: Gastroenterology;;   BIV ICD GENERATOR CHANGEOUT N/A 04/11/2020   Procedure: BIV ICD GENERATOR CHANGEOUT;  Surgeon: Marinus Maw, MD;  Location: [facility] INVASIVE CV LAB;  Service: Cardiovascular;  Laterality: N/A;   BREAST LUMPECTOMY Left 07/28/2010   CARDIAC CATHETERIZATION  2010   NORMAL CORONARY ARTERIES   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--11 SURGERIES     PT HAS REMOVABLE SPEECH BULB-TAKES IT OUT BEFORE HER SURGERY   COLONOSCOPY     COLONOSCOPY N/A 05/31/2018   Procedure: COLONOSCOPY;  Surgeon: Kerin Salen, MD;  Location: [facility] ENDOSCOPY;  Service: Gastroenterology;  Laterality: N/A;   HYSTEROSCOPY WITH D & C  01/20/2012   Procedure: DILATATION AND CURETTAGE /HYSTEROSCOPY;  Surgeon: Meriel Pica, MD;  Location: [facility] ORS;  Service: Gynecology;  Laterality: N/A;  with trueclear   PORT-A-CATH REMOVAL  09/23/2011   Procedure: REMOVAL PORT-A-CATH;  Surgeon: Emelia Loron, MD;  Location: [facility] ORS;  Service: General;  Laterality: N/A;  Port Removal   PORTACATH PLACEMENT  2012   TONSILLECTOMY  1960's   TOTAL KNEE ARTHROPLASTY  05/10/2012   Procedure: TOTAL KNEE ARTHROPLASTY;  Surgeon: Shelda Pal, MD;  Location: [facility] ORS;  Service: Orthopedics;  Laterality: Right;   WISDOM TOOTH EXTRACTION      Current Outpatient Medications:    acetaZOLAMIDE (DIAMOX) 125 MG tablet, Take 1 tablet (125 mg total) by mouth daily., Disp: 90 tablet, Rfl: 3   Ascorbic Acid (VITAMIN C) 1000 MG tablet, Take  1,000 mg by mouth daily., Disp: , Rfl:    azelastine (ASTELIN) 0.1 % nasal spray, Place 2 sprays into both nostrils 2 (two) times daily. Use in each nostril as directed, Disp:  30 mL, Rfl: 12   buPROPion (WELLBUTRIN XL) 150 MG 24 hr tablet, Take 1 tablet (150 mg total) by mouth daily., Disp: 30 tablet, Rfl: 2   carvedilol (COREG) 3.125 MG tablet, TAKE 1 TABLET (3.125 MG) BY MOUTH TWICE DAILY WITH MEALS, Disp: 180 tablet, Rfl: 3   citalopram (CELEXA) 20 MG tablet, Take 1 tablet (20 mg total) by mouth daily., Disp: 30 tablet, Rfl: 2   clonazePAM (KLONOPIN) 0.5 MG tablet, Take 1 tablet (0.5 mg total) by mouth 2 (two) times daily., Disp: 60 tablet, Rfl: 2   cyanocobalamin (VITAMIN B12) 1000 MCG tablet, Take 1,000 mcg by mouth daily., Disp: , Rfl:    desloratadine (CLARINEX) 5 MG tablet, Take 1 tablet (5 mg total) by mouth daily., Disp: 90 tablet, Rfl: 3   dicyclomine (BENTYL) 20 MG tablet, Take 20 mg by mouth 3 (three) times daily before meals., Disp: , Rfl:    fluticasone (FLONASE) 50 MCG/ACT nasal spray, Place 1 spray into both nostrils daily., Disp: , Rfl:    gabapentin (NEURONTIN) 100 MG capsule, TAKE 1 CAPSULE (100 MG TOTAL) BY MOUTH 2 (TWO) TIMES DAILY., Disp: 180 capsule, Rfl: 0   levothyroxine (SYNTHROID) 88 MCG tablet, TAKE 1 TABLET(88 MCG) BY MOUTH DAILY BEFORE BREAKFAST, Disp: 90 tablet, Rfl: 3   memantine (NAMENDA) 5 MG tablet, Take 1 tablet (5 mg total) by mouth daily., Disp: 30 tablet, Rfl: 3   metolazone (ZAROXOLYN) 2.5 MG tablet, TAKE 1 TABLET BY MOUTH EVERY OTHER TUESDAY., Disp: 12 tablet, Rfl: 3   midodrine (PROAMATINE) 5 MG tablet, Take 1 tablet (5 mg total) by mouth 3 (three) times daily., Disp: 200 tablet, Rfl: 3   Multiple Vitamin (MULTIVITAMIN WITH MINERALS) TABS tablet, Take 1 tablet by mouth daily., Disp: , Rfl:    Ondansetron HCl (ZOFRAN PO), 4 mg. Take 1 tablet on the tongue and allow to dissolve, Disp: , Rfl:    potassium chloride SA (KLOR-CON M) 20 MEQ tablet, TAKE 2 TABLETS THREE TIMES DAILY. TAKE AN EXTRA 2 TABLETS ON DAYS YOU TAKE METOLAZONE, Disp: 572 tablet, Rfl: 3   risperiDONE (RISPERDAL) 0.5 MG tablet, Take 1 tablet (0.5 mg total) by mouth  2 (two) times daily at 8 am and 4 pm., Disp: 60 tablet, Rfl: 2   simvastatin (ZOCOR) 10 MG tablet, TAKE 1 TABLET EVERY EVENING, Disp: 90 tablet, Rfl: 3   spironolactone (ALDACTONE) 25 MG tablet, TAKE 1 TABLET (25 MG TOTAL) BY MOUTH EVERY EVENING., Disp: 90 tablet, Rfl: 3   torsemide (DEMADEX) 20 MG tablet, Take 4 tablets (80 mg total) by mouth 2 (two) times daily., Disp: 240 tablet, Rfl: 6  Clinical Interview:   The following information was obtained during a clinical interview with Ms. Knapke prior to cognitive testing.  Cognitive Symptoms: Decreased short-term memory: Endorsed. Examples previously surrounded misplacing objects around the home, forgetting her intention when entering a room, trouble remembering the details of previous conversations or names of familiar individuals, and requiring others to repeat themselves often. Difficulties were said to have been present since around 2017-2018 and had declined over time. Both Ms. Odonnell and her husband reported the continuation of gradual decline relative to her prior October 2020 evaluation.  Decreased long-term memory: Denied. Decreased attention/concentration: Endorsed. She previously reported longstanding and fairly mild difficulties with  sustained attention and  distractibility. These had seemed to mildly worsen over the past several years.  Reduced processing speed: Endorsed. Difficulties with executive functions: Endorsed. She primarily described trouble with organization and multi-tasking, noting her need to focus on a single task in order to accomplish it well. She denied trouble with impulsivity. Outside of some increased irritability or her having a shorter fuse at times, no significant personality changes were noted.  Difficulties with emotion regulation: Denied. Difficulties with receptive language: Denied. Difficulties with word finding: Endorsed. These were also said to have worsened over the past few years.  Decreased visuoperceptual  ability: Endorsed. In addition to being "clumsy," Ms. Sanchez previously reported a history of numerous falls, often due to her tripping over things in her environment. This has been ongoing for several years.    Difficulties completing ADLs: Endorsed. Her husband has taken over the vast majority of financial management and bill paying responsibilities. He reported that they have a "paramedic" come to their home weekly to arrange and organize Ms. Kampa's medications. With reminders from her husband, she is able to take these medications reasonably well. Her husband noted that she may forget to take her afternoon doses at times. She no longer drives due to cognitive concerns. Relative to her 2020 evaluation, there does appear to be some functional decline.   Additional Medical History: History of traumatic brain injury/concussion: Denied. History of stroke: Denied. History of seizure activity: Denied. History of known exposure to toxins: Denied. Symptoms of chronic pain: Endorsed. She previously described prominent symptoms of peripheral neuropathy impacting her feet. This has continued. Her husband also reported significant edema and that she has not adhered to compression stocking recommendations reliably. Medical records also suggest a history of back pain due to a "disk (sic) problem."  Experience of frequent headaches/migraines: Denied. She did acknowledge a remote history of migraine headaches.  Frequent instances of dizziness/vertigo: Endorsed. While dizziness was said to be a contributing factor in several of her falls, this was not universally true. Most recently, she reported symptoms of lightheadedness preceding her falls, generally when she has been on her feet for extended periods of time while working in the kitchen.   Sensory changes: Ms. Mcleland wears corrective lenses with positive effect. She denied other sensory changes/difficulties (e.g., hearing, taste, or smell). Balance/coordination  difficulties: Endorsed. As described above, Ms. Kittrell has a history of frequent falls, including a reported 15 in the 2020 calendar year alone. She also noted gait abnormalities, including a history of stumbling, and will occasionally ambulate with the assistance of a cane. Falls were said to occur either forwards or backwards. Lately, her husband reported that Ms. Golembeski has become more cautious while ambulating and has not experienced any recent falls. Other motor difficulties: Denied.  Other medical conditions: Ms. Adie was diagnosed with breast cancer in 2012. She underwent a lumpectomy in March 2012, several rounds of chemotherapy in June 2012, and radiation in August 2012. Following treatment, she denied any observable persisting cognitive difficulties. However, her husband noted that she lost her hair a second time long after treatment ended, which was particularly traumatic for Ms. Fanton and has created ongoing self-esteem and mood-related difficulties.   Sleep History: Estimated hours obtained each night: 8 hours. Difficulties falling asleep: Denied. She noted that medications were helpful in aiding her in falling asleep. Difficulties staying asleep: Denied. Feels rested and refreshed upon awakening: Denied. She reported waking feeling tired and needing to lay in bed for a while each morning.  History of snoring: Denied. History of waking up gasping for air: Denied. Witnessed breath cessation while asleep: Denied.   History of vivid dreaming: Denied. Excessive movement while asleep: Denied. Instances of acting out her dreams: Endorsed. She previously reported sleep-eating, occurring approximately 1-2 times per week. This was not said to be present currently.   Psychiatric/Behavioral Health History: Depression: Endorsed. Ms. Everett previously reported longstanding symptoms of depression, dating back at least to her chemotherapy treatment. Her husband noted that she lost her hair long after the  conclusion of this treatment, which had been particularly traumatic for her and caused some diminished symptoms of self-esteem. Currently, she described her mood as "up and down." She continues to work with a psychiatrist for medication management and that said medications are somewhat helpful. I do not believe that she has engaged in individual therapy despite prior comments to suggest that she held an interest in this. Current or remote suicidal ideation, intent, or plan was denied.  Anxiety: Endorsed. Symptoms of anxiety were also described as longstanding in nature and largely attributed to a combination of the current state of the world, as well as a variety of personal psychosocial stressors.  Mania: Denied. Trauma History: Denied. Visual/auditory hallucinations: Denied. Delusional thoughts: Denied.   Tobacco: Endorsed. She reported an increase in tobacco consumption since her 2020 evaluation, currently consuming about 1/2 pack of cigarettes daily.  Alcohol: Ms. Valente denied current alcohol consumption as well as a history of problematic alcohol abuse or dependence.  Recreational drugs: Denied.  Family History: Problem Relation Age of Onset   Alzheimer's disease Mother    CVA Mother    Hypertension Mother    Cancer - Colon Father        late 60   Birth defects Paternal Uncle    This information was confirmed by Ms. Wolken.  Academic/Vocational History: Highest level of educational attainment: 18 years. She reported earning her Bachelor's degree in art education and a Master's degree in deafness rehabilitation. She described herself as a good (A/B) student in academic settings.  History of developmental delay: Denied. History of grade repetition: Denied. Enrollment in special education courses: Denied. History of LD/ADHD: Denied.  Employment: Ms. Muresan is currently disabled due to her history of congestive heart failure.   Evaluation Results:   Behavioral Observations: Ms. Crapps was  accompanied by his husband, arrived to her appointment on time, and was appropriately dressed and groomed. She appeared alert. Observed gait and station were within normal limits. Gross motor functioning appeared intact upon informal observation and no abnormal movements (e.g., tremors) were noted. Her affect was generally relaxed and positive. Spontaneous speech was mildly dysarthric at times. Prominent word finding difficulties were not observed during the clinical interview. Thought processes were coherent, organized, and normal in content. Insight into her cognitive difficulties appeared adequate.   During testing, repetition of task instructions was very commonplace due to Ms. Jafari forgetting said instructions in the middle of tasks. She was also noted to frequently lose her place and then exhibit some confusion and require redirection. Sustained attention was appropriate. Task engagement was adequate and she persisted when challenged. She did fatigue as the evaluation progressed and it was abbreviated in response. Overall, Ms. Strnad was cooperative with the clinical interview and subsequent testing procedures.   Adequacy of Effort: The validity of neuropsychological testing is limited by the extent to which the individual being tested may be assumed to have exerted adequate effort during testing. Ms. Cupit expressed her intention to  perform to the best of her abilities and exhibited adequate task engagement and persistence. Scores across stand-alone and embedded performance validity measures were variable. There remains the possibility that lower performances are due to true memory impairment rather than poor engagement or attempts to perform poorly. While mild caution may be prudent, I do believe that current results represent a reasonably valid representation of Ms. Johns's current cognitive functioning.  Test Results: Ms. Shiley was poorly oriented at the time of the current evaluation. She incorrectly  stated her age ("53 something"). She was also unable to state the current year ("20 something"), date, day of the week, or name of the current clinic.   Intellectual abilities based upon educational and vocational attainment were estimated to be in the average range. Premorbid abilities were estimated to be within the below average range based upon a single-word reading test.   Processing speed was exceptionally low. Basic attention was average. More complex attention (e.g., working memory) was unable to be assessed due to increasing fatigue. Cognitive flexibility was exceptionally low. Other aspects of executive functioning were unable to be assessed.  Receptive language abilities were unable to be assessed directly. Ms. Hartlieb did not exhibit any difficulties comprehending task instructions; however, she did appear to quickly forget said instructions and require repetition. She answered all questions asked of her appropriately during conversation. Assessed expressive language (e.g., verbal fluency and confrontation naming) was somewhat variable. Phonemic fluency was well below average to below average, semantic fluency was well below average to below average, and confrontation naming was exceptionally low.   Assessed visuospatial/visuoconstructional abilities were average to above average outside of an isolated deficit across a line orientation task.    Learning (i.e., encoding) of novel verbal information was exceptionally low. Spontaneous delayed recall (i.e., retrieval) of previously learned information was also exceptionally low. Retention rates were 0% across a story learning task, 0% across a list learning task, and 11% across a figure drawing task. Performance across recognition tasks was exceptionally low to well below average, suggesting negligible evidence for information consolidation.   Results of emotional screening instruments suggested that recent symptoms of generalized anxiety were in  the mild to moderate range, while symptoms of depression were within the mild range. A screening instrument assessing recent sleep quality suggested the presence of minimal sleep dysfunction.  Tables of Scores:   Note: This summary of test scores accompanies the interpretive report and should not be considered in isolation without reference to the appropriate sections in the text. Descriptors are based on appropriate normative data and may be adjusted based on clinical judgment. Terms such as "Within Normal Limits" and "Outside Normal Limits" are used when a more specific description of the test score cannot be determined.       Percentile - Normative Descriptor > 98 - Exceptionally High 91-97 - Well Above Average 75-90 - Above Average 25-74 - Average 9-24 - Below Average 2-8 - Well Below Average < 2 - Exceptionally Low       Validity:   DESCRIPTOR       ACS WC: --- --- Outside Normal Limits  DCT: --- --- Within Normal Limits  RBANS EI: --- --- Outside Normal Limits       Orientation:      Raw Score Percentile   NAB Orientation, Form 1 19/29 --- ---       Cognitive Screening:      Raw Score Percentile   SLUMS: 14/30 --- ---  RBANS, Form A: Standard Score/ Scaled Score Percentile   Total Score 54 <1 Exceptionally Low  Immediate Memory 49 <1 Exceptionally Low    List Learning 1 <1 Exceptionally Low    Story Memory 3 1 Exceptionally Low  Visuospatial/Constructional 89 23 Below Average    Figure Copy 12 75 Above Average    Line Orientation 10/20 3-9 Well Below Average  Language 60 <1 Exceptionally Low    Picture Naming 7/10 <2 Exceptionally Low    Semantic Fluency 4 2 Well Below Average  Attention 79 8 Well Below Average    Digit Span 10 50 Average    Coding 3 1 Exceptionally Low  Delayed Memory 40 <1 Exceptionally Low    List Recall 0/10 <2 Exceptionally Low    List Recognition 9/20 <2 Exceptionally Low    Story Recall 1 <1 Exceptionally Low    Story Recognition 6/12  3-4 Well Below Average    Figure Recall 3 1 Exceptionally Low    Figure Recognition 2/8 2-3 Well Below Average        Intellectual Functioning:      Standard Score Percentile   Test of Premorbid Functioning: 82 12 Below Average       Attention/Executive Function:     Trail Making Test (TMT): Raw Score (T Score) Percentile     Part A 95 secs.,  1 error (20) <1 Exceptionally Low    Part B Discontinued --- Impaired        D-KEFS Verbal Fluency Test: Raw Score (Scaled Score) Percentile     Letter Total Correct 20 (6) 9 Below Average    Category Total Correct 22 (6) 9 Below Average    Category Switching Total Correct 5 (2) <1 Exceptionally Low    Category Switching Accuracy 4 (3) 1 Exceptionally Low      Total Set Loss Errors 6 (5) 5 Well Below Average      Total Repetition Errors 3 (10) 50 Average       Language:     Verbal Fluency Test: Raw Score (T Score) Percentile     Phonemic Fluency (FAS) 20 (31) 3 Well Below Average    Animal Fluency 13 (40) 16 Below Average        NAB Language Module, Form 1: T Score Percentile     Naming 24/31 (22) <1 Exceptionally Low       Visuospatial/Visuoconstruction:      Raw Score Percentile   Clock Drawing: 10/10 --- Within Normal Limits       Mood and Personality:      Raw Score Percentile   Geriatric Depression Scale: 13 --- Mild  Geriatric Anxiety Scale: 18 --- Mild    Somatic 4 --- Minimal    Cognitive 7 --- Moderate    Affective 7 --- Moderate       Additional Questionnaires:      Raw Score Percentile   PROMIS Sleep Disturbance Questionnaire: 22 --- None to Slight   Informed Consent and Coding/Compliance:   The current evaluation represents a clinical evaluation for the purposes previously outlined by the referral source and is in no way reflective of a forensic evaluation.   Ms. Chick was provided with a verbal description of the nature and purpose of the present neuropsychological evaluation. Also reviewed were the foreseeable  risks and/or discomforts and benefits of the procedure, limits of confidentiality, and mandatory reporting requirements of this provider. The patient was given the opportunity to ask questions and receive answers about  the evaluation. Oral consent to participate was provided by the patient.   This evaluation was conducted by Newman Nickels, Ph.D., ABPP-CN, board certified clinical neuropsychologist. Ms. Cowing completed a clinical interview with Dr. Milbert Coulter, billed as one unit [phone], and 130 minutes of cognitive testing and scoring, billed as one unit [phone] and three additional units [id number]. Psychometrist Wallace Keller, B.S. assisted Dr. Milbert Coulter with test administration and scoring procedures. As a separate and discrete service, one unit [id number] and two units [phone] were billed for Dr. Tammy Sours time spent in interpretation and report writing.

## 2023-04-20 NOTE — Progress Notes (Signed)
   Psychometrician Note   Cognitive testing was administered to Rhonda [name] by Wallace Keller, B.S. (psychometrist) under the supervision of Dr. Newman Nickels, Ph.D., licensed psychologist on 04/20/2023. Rhonda [name] did not appear overtly distressed by the testing session per behavioral observation or responses across self-report questionnaires. Rest breaks were offered.    The battery of tests administered was selected by Dr. Newman Nickels, Ph.D. with consideration to Rhonda [name]'s current level of functioning, the nature of her symptoms, emotional and behavioral responses during interview, level of literacy, observed level of motivation/effort, and the nature of the referral question. This battery was communicated to the psychometrist. Communication between Dr. Newman Nickels, Ph.D. and the psychometrist was ongoing throughout the evaluation and Dr. Newman Nickels, Ph.D. was immediately accessible at all times. Dr. Newman Nickels, Ph.D. provided supervision to the psychometrist on the date of this service to the extent necessary to assure the quality of all services provided.    Rhonda [name] will return within approximately 1-2 weeks for an interactive feedback session with Dr. Milbert Coulter at which time her test performances, clinical impressions, and treatment recommendations will be reviewed in detail. Rhonda [name] understands she can contact our office should she require our assistance before this time.  A total of 130 minutes of billable time were spent face-to-face with Rhonda [name] by the psychometrist. This includes both test administration and scoring time. Billing for these services is reflected in the clinical report generated by Dr. Newman Nickels, Ph.D.  This note reflects time spent with the psychometrician and does not include test scores or any clinical interpretations made by Dr. Milbert Coulter. The full report will follow in a separate note.

## 2023-04-26 ENCOUNTER — Other Ambulatory Visit (HOSPITAL_COMMUNITY): Payer: Self-pay | Admitting: Emergency Medicine

## 2023-04-26 ENCOUNTER — Encounter: Payer: Self-pay | Admitting: Psychology

## 2023-04-26 NOTE — Progress Notes (Unsigned)
SOCIAL/MEDICAL BARRIERS:  PHARMACY USED ***   MED ISSUES:  AFFORDABILITY***  PT ASSIST APPS NEEDED***  PCP***   INSURANCE***  SOURCE OF INCOME***  TRANSPORTATION***  FOOD INSECURITIES/NEEDS*** FOOD STAMPS***  REVIEWED DIET/FLUID/SALT RESTRICTIONS***  RENT/OWN HOME ISSUES***  SOCIAL SUPPORT***  SAFETY/DOMESTIC ISSUES***  SUBSTANCE ABUSE***  DAILY WEIGHTS***  EDUCATE ON DISEASE PROCESS/SYMPTOMS/PURPOSES OF MEDS*** 0 Paramedicine Encounter    Patient ID: Rhonda [name], female    DOB: 11/16/1950, 72 y.o.   MRN: [id number]   Complaints***  Assessments:   Compliance with meds***  Pill box filled***  Refills needed***  Meds changes since last visit***    Social changes***   VISIT SUMMARY**  There were no vitals taken for this visit. Weight yesterday-*** Last visit weight-***     ACTION: {Paramed Action:[id number]}     Patient Care Team: Shon Hale, MD as PCP - General (Family Medicine) Burna Sis, LCSW as Social Worker (Licensed Clinical Social Worker) [name], Noberto Retort, DO as Consulting Physician (Neurology) [name], Paramedic as Paramedic Chanetta Marshall, Meridee Score, MD as Attending Physician (Family Medicine) Clinton Gallant, RN as [facility] Care Management Bensimhon, Bevelyn Buckles, MD as Consulting Physician (Cardiology) Cleotis Nipper, MD as Consulting Physician (Psychiatry) Candelaria Stagers, DPM as Consulting Physician (Podiatry)  Patient Active Problem List   Diagnosis Date Noted  . Mild dementia 04/20/2023  . Diabetic neuropathy   . Elevated troponin 12/02/2021  . Acute left ankle pain 10/11/2021  . Hyponatremia 10/08/2021  . Decreased estrogen level 08/01/2021  . Hyperparathyroidism 08/01/2021  . Spondylolisthesis 08/01/2021  . Varicose veins of bilateral lower extremities with other complications 08/01/2021  . Lumbago without sciatica 04/30/2021  . Abnormal gait 06/11/2020  . Allergic rhinitis  06/11/2020  . Asthma 06/11/2020  . Benign intracranial hypertension 06/11/2020  . Body mass index (BMI) 45.0-49.9, adult 06/11/2020  . Bowel incontinence 06/11/2020  . Cholelithiasis 06/11/2020  . Chronic sinusitis 06/11/2020  . 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  . Daytime somnolence 06/11/2020  . Edema 06/11/2020  . Family history of malignant neoplasm of gastrointestinal tract 06/11/2020  . Gout 06/11/2020  . Insomnia 06/11/2020  . 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 Lumbar spondylosis with myelopathy 06/11/2020  . Macrocytosis 06/11/2020  . History of colonic polyps 06/11/2020  . Repeated falls 06/11/2020  . Irritable bowel syndrome 01/04/2020  . Spinal stenosis of lumbar region 01/03/2020  . Bilateral leg weakness 08/01/2019  . Type II diabetes mellitus 06/15/2019  . Hypokalemia 11/08/2018  . CKD (chronic kidney disease) stage 3, GFR 30-59 ml/min 11/08/2018  . Orthostatic hypotension 07/28/2017  . On home oxygen therapy   . Migraines   . Left bundle branch block   . Hypothyroidism   . Hypertension   . Hyperlipidemia   . Heart murmur   . GERD (gastroesophageal reflux disease)   . Exertional shortness of breath   . Major depressive disorder   . Back pain   . Arthritis   . Generalized anxiety disorder   . Anemia   . Chronic respiratory failure 09/14/2013  . 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 Chronic systolic heart failure 10/27/2012  . Endometrial polyp 01/20/2012  . Malignant tumor of breast 03/26/2011  . Vitamin D deficiency 03/26/2011    Current Outpatient Medications:  .  acetaZOLAMIDE (DIAMOX) 125 MG tablet, Take 1 tablet (125 mg total) by mouth daily., Disp: 90 tablet, Rfl: 3 .  Ascorbic Acid (VITAMIN C) 1000 MG tablet, Take 1,000 mg by mouth daily., Disp: ,  Rfl:  .  azelastine (ASTELIN) 0.1 % nasal spray, Place 2 sprays into both nostrils 2 (two) times daily. Use in each nostril as directed, Disp: 30 mL, Rfl: 12 .  buPROPion  (WELLBUTRIN XL) 150 MG 24 hr tablet, Take 1 tablet (150 mg total) by mouth daily., Disp: 30 tablet, Rfl: 2 .  carvedilol (COREG) 3.125 MG tablet, TAKE 1 TABLET (3.125 MG) BY MOUTH TWICE DAILY WITH MEALS, Disp: 180 tablet, Rfl: 3 .  citalopram (CELEXA) 20 MG tablet, Take 1 tablet (20 mg total) by mouth daily., Disp: 30 tablet, Rfl: 2 .  clonazePAM (KLONOPIN) 0.5 MG tablet, Take 1 tablet (0.5 mg total) by mouth 2 (two) times daily., Disp: 60 tablet, Rfl: 2 .  cyanocobalamin (VITAMIN B12) 1000 MCG tablet, Take 1,000 mcg by mouth daily., Disp: , Rfl:  .  desloratadine (CLARINEX) 5 MG tablet, Take 1 tablet (5 mg total) by mouth daily., Disp: 90 tablet, Rfl: 3 .  dicyclomine (BENTYL) 20 MG tablet, Take 20 mg by mouth 3 (three) times daily before meals., Disp: , Rfl:  .  fluticasone (FLONASE) 50 MCG/ACT nasal spray, Place 1 spray into both nostrils daily., Disp: , Rfl:  .  gabapentin (NEURONTIN) 100 MG capsule, TAKE 1 CAPSULE (100 MG TOTAL) BY MOUTH 2 (TWO) TIMES DAILY., Disp: 180 capsule, Rfl: 0 .  levothyroxine (SYNTHROID) 88 MCG tablet, TAKE 1 TABLET(88 MCG) BY MOUTH DAILY BEFORE BREAKFAST, Disp: 90 tablet, Rfl: 3 .  memantine (NAMENDA) 5 MG tablet, Take 1 tablet (5 mg total) by mouth daily., Disp: 30 tablet, Rfl: 3 .  metolazone (ZAROXOLYN) 2.5 MG tablet, TAKE 1 TABLET BY MOUTH EVERY OTHER TUESDAY., Disp: 12 tablet, Rfl: 3 .  midodrine (PROAMATINE) 5 MG tablet, Take 1 tablet (5 mg total) by mouth 3 (three) times daily., Disp: 200 tablet, Rfl: 3 .  Multiple Vitamin (MULTIVITAMIN WITH MINERALS) TABS tablet, Take 1 tablet by mouth daily., Disp: , Rfl:  .  Ondansetron HCl (ZOFRAN PO), 4 mg. Take 1 tablet on the tongue and allow to dissolve, Disp: , Rfl:  .  potassium chloride SA (KLOR-CON M) 20 MEQ tablet, TAKE 2 TABLETS THREE TIMES DAILY. TAKE AN EXTRA 2 TABLETS ON DAYS YOU TAKE METOLAZONE, Disp: 572 tablet, Rfl: 3 .  risperiDONE (RISPERDAL) 0.5 MG tablet, Take 1 tablet (0.5 mg total) by mouth 2 (two)  times daily at 8 am and 4 pm., Disp: 60 tablet, Rfl: 2 .  simvastatin (ZOCOR) 10 MG tablet, TAKE 1 TABLET EVERY EVENING, Disp: 90 tablet, Rfl: 3 .  spironolactone (ALDACTONE) 25 MG tablet, TAKE 1 TABLET (25 MG TOTAL) BY MOUTH EVERY EVENING., Disp: 90 tablet, Rfl: 3 .  torsemide (DEMADEX) 20 MG tablet, Take 4 tablets (80 mg total) by mouth 2 (two) times daily., Disp: 240 tablet, Rfl: 6 Allergies  Allergen Reactions  . Ceftin Anaphylaxis    Face and throat swell   . Cefuroxime Axetil Anaphylaxis    Face and throat swell  . Geodon [Ziprasidone Hcl] Hives  . Lisinopril Other (See Comments)    angioedema  . Cefuroxime     Other reaction(s): anaphylaxis  . Sulfacetamide Sodium-Sulfur     Other reaction(s): itch  . Allopurinol Nausea Only and Other (See Comments)    weakness  . Ativan [Lorazepam] Itching  . Sulfa Antibiotics Itching  . Valium [Diazepam] Other (See Comments)    Patient states that diazepam doesn't relax, it has the opposite effect.     Social History   Socioeconomic History  .  Marital status: Married    Spouse name: Not on file  . Number of children: 2  . Years of education: 10  . Highest education level: Master's degree (e.g., MA, MS, MEng, MEd, MSW, MBA)  Occupational History  . Occupation: retired  Tobacco Use  . Smoking status: Some Days    Current packs/day: 0.00    Average packs/day: 0.1 packs/day for 26.0 years (2.6 ttl pk-yrs)    Types: Cigarettes    Start date: 03/24/1995    Last attempt to quit: 03/23/2021    Years since quitting: 2.0  . Smokeless tobacco: Never  Vaping Use  . Vaping status: Never Used  Substance and Sexual Activity  . Alcohol use: No  . Drug use: No  . Sexual activity: Yes  Other Topics Concern  . Not on file  Social History Narrative   Tobacco Use Cigarettes: Former Smoker, Quit in 2008   No Alcohol   No recreational drug use   Diet: Regular/Low Carb   Exercise: None   Occupation: disabled   Education: Company secretary,  masters   Children: 2   Firearms: No   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      Social Determinants of Health   Financial Resource Strain: Not on file  Food Insecurity: No Food Insecurity (03/31/2023)   Hunger Vital Sign   . Worried About Programme researcher, broadcasting/film/video in the Last Year: Never true   . Ran Out of Food in the Last Year: Never true  Transportation Needs: No Transportation Needs (03/31/2023)   PRAPARE - Transportation   . Lack of Transportation (Medical): No   . Lack of Transportation (Non-Medical): No  Physical Activity: Not on file  Stress: No Stress Concern Present (08/11/2022)   [name] of Occupational Health - Occupational Stress Questionnaire   . Feeling of Stress : Only a little  Social Connections: Not on file  Intimate Partner Violence: Not At Risk (07/27/2022)   Humiliation, Afraid, Rape, and Kick questionnaire   . Fear of Current or Ex-Partner: No   . Emotionally Abused: No   . Physically Abused: No   . Sexually Abused: No    Physical Exam      Future Appointments  Date Time Provider Department Center  04/27/2023  7:00 AM [facility] DEVICE REMOTES [facility]  04/27/2023  2:30 PM Rosann Auerbach, PhD [facility] None  04/28/2023 11:30 AM [name] D, LCSW [facility] None  06/02/2023  1:45 PM Freddie Breech, DPM [facility]  06/02/2023  2:00 PM Sheron Nightingale, RD [facility]  06/10/2023  3:20 PM Arfeen, Phillips Grout, MD [facility] None  06/14/2023  7:10 AM [facility] DEVICE REMOTES [facility]  06/29/2023  1:30 PM Nita Sickle K, DO [facility] None  09/13/2023  7:10 AM [facility] DEVICE REMOTES [facility]

## 2023-04-27 ENCOUNTER — Ambulatory Visit: Payer: Medicare HMO | Admitting: Psychology

## 2023-04-27 DIAGNOSIS — G932 Benign intracranial hypertension: Secondary | ICD-10-CM

## 2023-04-27 DIAGNOSIS — F03A3 Unspecified dementia, mild, with mood disturbance: Secondary | ICD-10-CM | POA: Diagnosis not present

## 2023-04-27 NOTE — Progress Notes (Signed)
   Neuropsychology Feedback Session Eligha Bridegroom. [facility] [place] Department of Neurology  Reason for Referral:   Rhonda [name] is a 72 y.o. right-handed African-American female referred by Nita Sickle, D.O., to characterize her current cognitive functioning and assist with diagnostic clarity and treatment planning in the context of subjective cognitive decline and numerous psychiatric and medical comorbidities.   Feedback:   Rhonda [name] completed a comprehensive neuropsychological evaluation on 04/20/2023. Please refer to that encounter for the full report and recommendations. Briefly, results suggested significant cognitive impairment across processing speed, executive functioning, confrontation naming, and all aspects of learning and memory. Further performance variability was exhibited across verbal fluency and visuospatial abilities. Basic attention was intact relative to age-matched peers. The etiology of her dementia presentation is somewhat unclear. Memory testing does elevate concerns for underlying Alzheimer's disease. She was fully amnestic (i.e., 0% retention) after a brief delay across both verbal memory tasks and only recalled 11% of a visual memory task. Performances across yes/no recognition trials were also very poor. Taken together, this suggests evidence for rapid forgetting and a prominent storage impairment, both of which are the hallmark testing characteristics of this illness. Further weakness/impairment in confrontation naming and semantic fluency would follow typical disease progression. This remains a plausible explanation for ongoing deficits.   Rhonda [name] was accompanied by her husband during the current feedback session. Content of the current session focused on the results of her neuropsychological evaluation. Rhonda [name] was given the opportunity to ask questions and her questions were answered. She was encouraged to reach out should additional questions arise. A copy  of her report was provided at the conclusion of the visit.      One unit [phone] was billed for Dr. Tammy Sours time spent preparing for, conducting, and documenting the current feedback session with Rhonda [name].

## 2023-04-28 ENCOUNTER — Other Ambulatory Visit (HOSPITAL_COMMUNITY): Payer: Self-pay | Admitting: Psychiatry

## 2023-04-28 ENCOUNTER — Ambulatory Visit: Payer: Self-pay | Admitting: Licensed Clinical Social Worker

## 2023-04-28 ENCOUNTER — Other Ambulatory Visit (HOSPITAL_COMMUNITY): Payer: Self-pay | Admitting: Internal Medicine

## 2023-04-28 DIAGNOSIS — R4189 Other symptoms and signs involving cognitive functions and awareness: Secondary | ICD-10-CM

## 2023-04-28 DIAGNOSIS — F330 Major depressive disorder, recurrent, mild: Secondary | ICD-10-CM

## 2023-04-28 DIAGNOSIS — F411 Generalized anxiety disorder: Secondary | ICD-10-CM

## 2023-04-29 ENCOUNTER — Ambulatory Visit: Payer: Medicare HMO | Attending: Internal Medicine

## 2023-04-29 DIAGNOSIS — I5022 Chronic systolic (congestive) heart failure: Secondary | ICD-10-CM | POA: Diagnosis not present

## 2023-04-29 DIAGNOSIS — Z95810 Presence of automatic (implantable) cardiac defibrillator: Secondary | ICD-10-CM

## 2023-04-29 NOTE — Patient Outreach (Signed)
  Care Coordination   Follow Up Visit Note   04/28/2023 Name: Rhonda [name] MRN: [id number] DOB: [birth year]  Rhonda [name] is a 72 y.o. year old female who sees Chanetta Marshall, Meridee Score, MD for primary care. I spoke with  [name]'s spouse by phone today.  What matters to the patients health and wellness today?  Level of Care    Goals Addressed             This Visit's Progress    Obtain Supportive Resources   On track    Activities and task to complete in order to accomplish goals.   Keep all upcoming appointments discussed today Continue with compliance of taking medication prescribed by Doctor Implement healthy coping skills discussed to assist with management of symptoms Provide required ppwk to [facility]; who will continue to assist family with applying for Medicaid         SDOH assessments and interventions completed:  No     Care Coordination Interventions:  Yes, provided  Interventions Today    Flowsheet Row Most Recent Value  Chronic Disease   Chronic disease during today's visit Hypertension (HTN), Other  [Dementia, MDD, GAD]  General Interventions   General Interventions Discussed/Reviewed General Interventions Reviewed, Doctor Visits, Level of Care  Doctor Visits Discussed/Reviewed Doctor Visits Reviewed  [facility] an appt with Neurology, testing indicated decrease in short term memory and a decline in process abilities]  Level of Care Adult Daycare  [Spouse has to obtain bank statements to apply for Medicaid. Patient has to be present. Endorses difficulty getting her to bank,  therefore, [facility] services are on hold for now. He will f/up with them with required info]  Applications Medicaid  Education Interventions   Applications Medicaid  Mental Health Interventions   Mental Health Discussed/Reviewed Mental Health Reviewed, Coping Strategies, Anxiety, Depression  [Caregiver stress acknowledged and validated. Self care strategies discussed. Pt is compliant with psych  medications to assist with symptom management]  Nutrition Interventions   Nutrition Discussed/Reviewed Nutrition Reviewed  Pharmacy Interventions   Pharmacy Dicussed/Reviewed Pharmacy Topics Reviewed, Medication Adherence  [Spouse is picking up medications from pharmacy today]  Safety Interventions   Safety Discussed/Reviewed Safety Reviewed       Follow up plan: Follow up call scheduled for 2-6 weeks    Encounter Outcome:  Patient Visit Completed   [name], MSW, LCSW [facility] Care Management [facility]  [facility] [email or web address] Phone [phone] 11:56 AM

## 2023-04-29 NOTE — Patient Instructions (Signed)
Visit Information  Thank you for taking time to visit with me today. Please don't hesitate to contact me if I can be of assistance to you.   Following are the goals we discussed today:   Goals Addressed             This Visit's Progress    Obtain Supportive Resources   On track    Activities and task to complete in order to accomplish goals.   Keep all upcoming appointments discussed today Continue with compliance of taking medication prescribed by Doctor Implement healthy coping skills discussed to assist with management of symptoms Provide required ppwk to [facility]; who will continue to assist family with applying for Medicaid         Our next appointment is by telephone on 01/09 at 11:30 AM  Please call the care guide team at [phone] if you need to cancel or reschedule your appointment.   If you are experiencing a Mental Health or Behavioral Health Crisis or need someone to talk to, please call the Suicide and Crisis Lifeline: 988 call 911   The patient verbalized understanding of instructions, educational materials, and care plan provided today and DECLINED offer to receive copy of patient instructions, educational materials, and care plan.   [name], MSW, LCSW [facility] Care Management [place] [facility]  [facility] [email or web address] Phone [phone] 11:59 AM

## 2023-04-30 NOTE — Progress Notes (Signed)
EPIC Encounter for ICM Monitoring  Patient Name: Rhonda [name] is a 72 y.o. female Date: 04/30/2023 Primary Care Physican: Shon Hale, MD Primary Cardiologist: Bensimhon Electrophysiologist: Rae Roam Pacing: 98.5%   06/01/2022 Weight: 193 lbs  07/08/2022 Weight: 183 lbs 08/10/2022 Weight: 185 lbs 11/25/2022 Weight: 180 lbs 12/30/2022 Weight: 184 lbs 04/26/2023 Weight: 214 lbs Per EMT note         Spoke with husband and heart failure questions reviewed.  Transmission results reviewed.  Husband reports pt is SOB and leg are swollen (declines to wear compression stockings).  Reports feeling well at this time and voices no complaints.                 Optivol thoracic impedance suggesting ongoing possible fluid accumulation starting 10/23.     Prescribed:  [name] Setting with paramedicine program assists with meds. Torsemide 20 mg 4 tablets (80 mg total) by mouth twice a day  Potassium 20 meq 2 tablets (40 mEq total) by mouth 3 (three) times daily.  Take extra 2 tablets on days you take Metolazone.   Metolazone 2.5mg  1 tablet every other Tuesday.  (next dosage due 05/04/23) Spironolactone 25 mg take 1 tablet by mouth every evening.   Labs: 01/13/2023 Creatinine 1.62, BUN 28, Potassium 3.5, Sodium 134, GFR 34 12/30/2022 Creatinine 1.64, BUN 31, Potassium 3.6, Sodium 134, GFR 33 12/02/2022 Creatinine 1.68 BUN 23,  Potassium 4.0, Sodium 136, GFR 33 08/01/2022 Creatinine 1.47, BUN 42, Potassium 3.1, Sodium 136, GFR 38  07/26/2022 Creatinine 2.02, BUN 25, Potassium 4.6, Sodium 133, GFR 26  07/22/2022 Creatinine 1.57, BUN 20, Potassium 3.8, Sodium 136, GFR 35  07/14/2022 Creatinine 1.32, BUN 20, Potassium 4.2, Sodium 138, GFR 44 06/25/2022 Creatinine 2.19, BUN 29, Potassium 3.5, Sodium 134, GFR 24 A complete set of results can be found in Results Review.   Recommendations:  Copy sent to Prince Rome, NP at [facility] clinic for review and recommendation.     Follow-up plan: ICM clinic phone  appointment on 05/05/2023 to recheck fluid levels.   91 day device clinic remote transmission 06/14/2023.     EP/Cardiology Office Visits:  Recall 05/29/2023 with Dr Gala Romney.  Recall 03/20/2023 with Dr Ladona Ridgel.     Copy of ICM check sent to Dr. Ladona Ridgel.    3 month ICM trend: 04/29/2023.    12-14 Month ICM trend:     Karie Soda, RN 04/30/2023 12:47 PM

## 2023-04-30 NOTE — Progress Notes (Signed)
Unable to provide recommendations to husband due to his was working.  Unable to reach [name] Setting EMT with [name].

## 2023-04-30 NOTE — Progress Notes (Signed)
Spoke with husband and advised Prince Rome NP provided recommendations.  He reports he was at work and unable to receive the recommendations at this time.  He requested to provide [name] Setting EMT with [name] with new recommendations and she will contact him with the information.  Staff message sent to [name] requesting a call or msg back regarding new instructions.

## 2023-04-30 NOTE — Progress Notes (Signed)
  Received: Today [place], Anderson Malta, FNP  Laryssa Hassing, Josephine Igo, RN  Increase torsemide to 100 mg bid, increase KCL to 60 tid  Take metolazone 2.5 mg + EXTRA 40 KCL today and tomorrow. She will need a BMET in 1 week

## 2023-05-03 ENCOUNTER — Other Ambulatory Visit (HOSPITAL_COMMUNITY): Payer: Self-pay | Admitting: Emergency Medicine

## 2023-05-03 ENCOUNTER — Telehealth (HOSPITAL_COMMUNITY): Payer: Self-pay | Admitting: Emergency Medicine

## 2023-05-03 ENCOUNTER — Other Ambulatory Visit (HOSPITAL_COMMUNITY): Payer: Self-pay | Admitting: Psychiatry

## 2023-05-03 DIAGNOSIS — F411 Generalized anxiety disorder: Secondary | ICD-10-CM

## 2023-05-03 DIAGNOSIS — R4189 Other symptoms and signs involving cognitive functions and awareness: Secondary | ICD-10-CM

## 2023-05-03 DIAGNOSIS — F330 Major depressive disorder, recurrent, mild: Secondary | ICD-10-CM

## 2023-05-03 MED ORDER — POTASSIUM CHLORIDE CRYS ER 20 MEQ PO TBCR: 60.0000 meq | EXTENDED_RELEASE_TABLET | Freq: Three times a day (TID) | ORAL | 3 refills | Status: DC

## 2023-05-03 MED ORDER — TORSEMIDE 20 MG PO TABS: 100.0000 mg | ORAL_TABLET | Freq: Two times a day (BID) | ORAL | 2 refills | Status: DC

## 2023-05-03 NOTE — Progress Notes (Signed)
Paramedicine Encounter    Patient ID: Rhonda [name], female    DOB: 1951-01-16, 72 y.o.   MRN: [id number]   Complaints - exertional shortness of breath, productive cough since Wednesday, neuropathy in feet.   Assessment - clear lung sounds, +2-3 pedal edema below knees, distended abdomen.   Compliance with meds - husband did not fill box with prescription refills as he received. Pt has not taken torsemide since Thurs AM.   Pill box filled - for 1x week.   Refills needed - clonazepam  Meds changes since last visit - increased Torsemide to 100mg  BID, increased potassium to 60 mEq TID, 1x extra dose of Metolazone 2.5 mg on Tuesday 12/10.     Social changes - none   VISIT SUMMARY**  I met with Rhonda [name] in the home. Pt appeared sluggish and tired. Pt became short of breath walking from her room to the front door, but quickly alleviated with rest. Pt reported that she has been sick with a head cold and productive cough since Wednesday. Pt reported drinking tea and eating soup to help her throat. Pt was reminded of her nutritional restrictions and gave examples with pt's dishes to help pt visualize. Per Prince Rome, NP, I increased torsemide and potassium as noted and added an additional dose of metolazone for tomorrow. Pt was informed of this medication change. Will follow up later this week to evaluate weight change and another device reading if necessary. Pt's pill box was filled for 1x week. Will follow up in 1x week. I did text husband as he was not present at time of visit to schedule Lab in 1x week, will follow up to ensure this is scheduled.   BP 132/78   Pulse 68   Temp (!) 96.7 F (35.9 C)   Resp 18   Wt 223 lb 4.8 oz (101.3 kg)   SpO2 99%   BMI 41.51 kg/m  Weight yesterday-DNW Last visit weight-214 lbs   [name] Setting EMT-P Community Paramedic  [phone]     ACTION: Home visit completed     Patient Care Team: Shon Hale, MD as PCP - General  (Family Medicine) Burna Sis, LCSW as Social Worker (Licensed Clinical Social Worker) [name], Noberto Retort, DO as Consulting Physician (Neurology) [name], Paramedic as Paramedic Chanetta Marshall, Meridee Score, MD as Attending Physician (Family Medicine) Clinton Gallant, RN as [facility] Care Management Bensimhon, Bevelyn Buckles, MD as Consulting Physician (Cardiology) Lolly Mustache, Phillips Grout, MD as Consulting Physician (Psychiatry) Candelaria Stagers, DPM as Consulting Physician (Podiatry)  Patient Active Problem List   Diagnosis Date Noted   Mild dementia 04/20/2023   Diabetic neuropathy    Elevated troponin 12/02/2021   Acute left ankle pain 10/11/2021   Hyponatremia 10/08/2021   Decreased estrogen level 08/01/2021   Hyperparathyroidism 08/01/2021   Spondylolisthesis 08/01/2021   Varicose veins of bilateral lower extremities with other complications 08/01/2021   Lumbago without sciatica 04/30/2021   Abnormal gait 06/11/2020   Allergic rhinitis 06/11/2020   Asthma 06/11/2020   Benign intracranial hypertension 06/11/2020   Body mass index (BMI) 45.0-49.9, adult 06/11/2020   Bowel incontinence 06/11/2020   Cholelithiasis 06/11/2020   Chronic sinusitis 06/11/2020   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   Daytime somnolence 06/11/2020   Edema 06/11/2020   Family history of malignant neoplasm of gastrointestinal tract 06/11/2020   Gout 06/11/2020   Insomnia 06/11/2020   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   Lumbar spondylosis with myelopathy 06/11/2020   Macrocytosis 06/11/2020   History  of colonic polyps 06/11/2020   Repeated falls 06/11/2020   Irritable bowel syndrome 01/04/2020   Spinal stenosis of lumbar region 01/03/2020   Bilateral leg weakness 08/01/2019   Type II diabetes mellitus 06/15/2019   Hypokalemia 11/08/2018   CKD (chronic kidney disease) stage 3, GFR 30-59 ml/min 11/08/2018   Orthostatic hypotension 07/28/2017   On home oxygen therapy    Migraines    Left bundle branch block     Hypothyroidism    Hypertension    Hyperlipidemia    Heart murmur    GERD (gastroesophageal reflux disease)    Exertional shortness of breath    Major depressive disorder    Back pain    Arthritis    Generalized anxiety disorder    Anemia    Chronic respiratory failure 09/14/2013   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 Use in each nostril as directed, Disp: 30 mL, Rfl: 12   buPROPion (WELLBUTRIN XL) 150 MG 24 hr tablet, Take 1 tablet (150 mg total) by mouth daily., Disp: 30 tablet, Rfl: 2   carvedilol (COREG) 3.125 MG tablet, TAKE 1 TABLET (3.125 MG) BY MOUTH TWICE DAILY WITH MEALS, Disp: 180 tablet, Rfl: 3   citalopram (CELEXA) 20 MG tablet, Take 1 tablet (20 mg total) by mouth daily., Disp: 30 tablet, Rfl: 2   clonazePAM (KLONOPIN) 0.5 MG tablet, Take 1 tablet (0.5 mg total) by mouth 2 (two) times daily., Disp: 60 tablet, Rfl: 2   cyanocobalamin (VITAMIN B12) 1000 MCG tablet, Take 1,000 mcg by mouth daily., Disp: , Rfl:    desloratadine (CLARINEX) 5 MG tablet, Take 1 tablet (5 mg total) by mouth daily., Disp: 90 tablet, Rfl: 3   dicyclomine (BENTYL) 20 MG tablet, Take 20 mg by mouth 3 (three) times daily before meals., Disp: , Rfl:    fluticasone (FLONASE) 50 MCG/ACT nasal spray, Place 1 spray into both nostrils daily., Disp: , Rfl:    gabapentin (NEURONTIN) 100 MG capsule, TAKE 1 CAPSULE (100 MG TOTAL) BY MOUTH 2 (TWO) TIMES DAILY., Disp: 180 capsule, Rfl: 0    levothyroxine (SYNTHROID) 88 MCG tablet, TAKE 1 TABLET(88 MCG) BY MOUTH DAILY BEFORE BREAKFAST, Disp: 90 tablet, Rfl: 3   memantine (NAMENDA) 5 MG tablet, Take 1 tablet (5 mg total) by mouth daily., Disp: 30 tablet, Rfl: 3   metolazone (ZAROXOLYN) 2.5 MG tablet, TAKE 1 TABLET BY MOUTH EVERY OTHER TUESDAY., Disp: 12 tablet, Rfl: 3   midodrine (PROAMATINE) 5 MG tablet, Take 1 tablet (5 mg total) by mouth 3 (three) times daily., Disp: 200 tablet, Rfl: 3   Multiple Vitamin (MULTIVITAMIN WITH MINERALS) TABS tablet, Take 1 tablet by mouth daily., Disp: , Rfl:    Ondansetron HCl (ZOFRAN PO), 4 mg. Take 1 tablet on the tongue and allow to dissolve, Disp: , Rfl:    potassium chloride SA (KLOR-CON M) 20 MEQ tablet, Take 3 tablets (60 mEq total) by mouth 3 (three) times daily., Disp: 810 tablet, Rfl: 3  risperiDONE (RISPERDAL) 0.5 MG tablet, Take 1 tablet (0.5 mg total) by mouth 2 (two) times daily at 8 am and 4 pm., Disp: 60 tablet, Rfl: 2   simvastatin (ZOCOR) 10 MG tablet, TAKE 1 TABLET EVERY EVENING, Disp: 90 tablet, Rfl: 3   spironolactone (ALDACTONE) 25 MG tablet, TAKE 1 TABLET (25 MG TOTAL) BY MOUTH EVERY EVENING., Disp: 90 tablet, Rfl: 3   torsemide (DEMADEX) 20 MG tablet, Take 5 tablets (100 mg total) by mouth 2 (two) times daily., Disp: 900 tablet, Rfl: 2 Allergies  Allergen Reactions   Ceftin Anaphylaxis    Face and throat swell    Cefuroxime Axetil Anaphylaxis    Face and throat swell   Geodon [Ziprasidone Hcl] Hives   Lisinopril Other (See Comments)    angioedema   Cefuroxime     Other reaction(s): anaphylaxis   Sulfacetamide Sodium-Sulfur     Other reaction(s): itch   Allopurinol Nausea Only and Other (See Comments)    weakness   Ativan [Lorazepam] Itching   Sulfa Antibiotics Itching   Valium [Diazepam] Other (See Comments)    Patient states that diazepam doesn't relax, it has the opposite effect.     Social History   Socioeconomic History   Marital status: Married    Spouse  name: Not on file   Number of children: 2   Years of education: 24   Highest education level: Master's degree (e.g., MA, MS, MEng, MEd, MSW, MBA)  Occupational History   Occupation: retired  Tobacco Use   Smoking status: Some Days    Current packs/day: 0.00    Average packs/day: 0.1 packs/day for 26.0 years (2.6 ttl pk-yrs)    Types: Cigarettes    Start date: 03/24/1995    Last attempt to quit: 03/23/2021    Years since quitting: 2.1   Smokeless tobacco: Never  Vaping Use   Vaping status: Never Used  Substance and Sexual Activity   Alcohol use: No   Drug use: No   Sexual activity: Yes  Other Topics Concern   Not on file  Social History Narrative   Tobacco Use Cigarettes: Former Smoker, Quit in 2008   No Alcohol   No recreational drug use   Diet: Regular/Low Carb   Exercise: None   Occupation: disabled   Education: Company secretary, masters   Children: 2   Firearms: No   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      Social Determinants of Health   Financial Resource Strain: Not on file  Food Insecurity: No Food Insecurity (03/31/2023)   Hunger Vital Sign    Worried About Running Out of Food in the Last Year: Never true    Ran Out of Food in the Last Year: Never true  Transportation Needs: No Transportation Needs (03/31/2023)   PRAPARE - Administrator, Civil Service (Medical): No    Lack of Transportation (Non-Medical): No  Physical Activity: Not on file  Stress: No Stress Concern Present (08/11/2022)   [name] of Occupational Health - Occupational Stress Questionnaire    Feeling of Stress : Only a little  Social Connections: Not on file  Intimate Partner Violence: Not At Risk (07/27/2022)   Humiliation, Afraid, Rape, and Kick questionnaire    Fear of Current or Ex-Partner: No    Emotionally Abused: No    Physically Abused: No    Sexually Abused: No    Physical Exam  Future Appointments  Date Time  Provider Department Center  05/05/2023  7:05 AM [facility] DEVICE REMOTES [facility]  05/07/2023  9:30 AM [facility] LAB [facility] None  05/20/2023  7:05 AM [facility] DEVICE REMOTES [facility]  06/02/2023  1:45 PM Freddie Breech, DPM [facility]  06/02/2023  2:00 PM Sheron Nightingale, RD [facility] NDM  06/03/2023 11:30 AM [name] D, LCSW [facility] None  06/10/2023  3:20 PM Arfeen, Phillips Grout, MD [facility] None  06/14/2023  7:10 AM [facility] DEVICE REMOTES [facility]  06/29/2023  1:30 PM Nita Sickle K, DO [facility] None  09/13/2023  7:10 AM [facility] DEVICE REMOTES [facility]

## 2023-05-03 NOTE — Progress Notes (Signed)
Returned call to husband and assisted in sending manual remote transmission.  Disussed limiting salt intake and he was planning on getting take out food for lunch and dinner today since he will be working.  Encouraged to decrease restaurant food.   Optivol suggesting fluid levels are worse than compared to last week.

## 2023-05-03 NOTE — Progress Notes (Signed)
Spoke with [name] Setting, EMT Paramedicine.   She stated she is scheduled home visit today to fill patient pill box.  Pt normally takes Metolazone on Mondays and she was scheduled to take one this AM.  Pt has been gaining weight but no other signs of fluid accumulation.

## 2023-05-03 NOTE — Progress Notes (Signed)
Spoke with husband.  Advised unable to reach [name] Setting, [name] to provide medication changes.  He said he will text her and have her call ICM number.

## 2023-05-03 NOTE — Telephone Encounter (Signed)
Hey, Just wanted to update you on Rhonda [name]'s clinical presentation today. Pt appears short of breath upon exertion, but alleviates at rest. She has a new onset of a head cold with post nasal drip, sore throat, and productive cough since Wednesday. No shortness of breath at rest, no difficulty speaking in full and complete sentences.   I do not work on Fridays and husband was unable to understand medication change as ordered by Shanda Bumps. I did note that he also did not add the Torsemide to pt's pill box as instructed since he did not have it on visit day. Pt was noted to be out of Torsemide since Thursday. Pt took her regularly scheduled Metolazone this morning and subsequent metolazone scheduled in pill box for tomorrow. I also updated pill box to reflect the increase in torsemide and potassium.   I wanted to inform you that in 6 days since our last visit, pt has gained 9 lbs. Pt presents with +2-3 pedal edema. And clear lung sounds. Pt reports increase in neuropathy  12/3 - 214 lbs 12/9 - 223 lbs  I plan to follow up later this week for an updated weight, to confirm lab appointment, and if necessary, help with another device transmission.   Please let me know if I can do anything further.   [name] Setting EMT-P Community Paramedic  [phone]

## 2023-05-03 NOTE — Addendum Note (Signed)
Addended by: Karie Soda on: 05/03/2023 11:00 AM   Modules accepted: Orders

## 2023-05-03 NOTE — Progress Notes (Addendum)
Spoke with [name] Setting, [name].  Advised remote transmission is showing fluid level is worse than last week.  Advised to implement recommendations given by Prince Rome, NP on 12/6 husband was unable to make the medication changes himself on 12/6.    Advised since patient took Metolazone today, 12/9, then patient should take the 2nd Metolazone dosage as recommended tomorrow, 12/10, with 40 extra Potassium.  Also recommendation to increase Torsemide to 100 mg twice a day and change Potassium to 60 mEq three (3) times a day.  [name] will get work with patient/husband to schedule BMET in a week.    Sarah verbalized understanding of recommendations and will update med changes with patient later today when she does the home visit.   Med changes updated in Epic and BMET order placed.    Next ICM Remote transmission scheduled for 12/26 to recheck fluid levels.

## 2023-05-04 ENCOUNTER — Other Ambulatory Visit (HOSPITAL_COMMUNITY): Payer: Self-pay | Admitting: Emergency Medicine

## 2023-05-04 ENCOUNTER — Telehealth (HOSPITAL_COMMUNITY): Payer: Self-pay | Admitting: Emergency Medicine

## 2023-05-04 NOTE — Telephone Encounter (Signed)
I spoke with Rhonda [name], asking how she is. Pt reports she has continued exertional shortness of breath, but again alleviates with rest. Pt reports some pain in the first 3 toes on her left foot, advising that they feel weird, and report they look shiny and swollen. No change to weight since yesterday. Will follow up tomorrow.   [name] Setting EMT-P Community Paramedic  [phone]

## 2023-05-04 NOTE — Telephone Encounter (Signed)
WEIGHT 223 lbs

## 2023-05-04 NOTE — Telephone Encounter (Signed)
Update from [name] which may explain why remote transmission fluid levels appeared worse since patient missed Torsemide dosages.

## 2023-05-04 NOTE — Progress Notes (Signed)
Opened in error

## 2023-05-05 ENCOUNTER — Other Ambulatory Visit (HOSPITAL_COMMUNITY): Payer: Self-pay | Admitting: Physician Assistant

## 2023-05-05 ENCOUNTER — Telehealth (HOSPITAL_COMMUNITY): Payer: Self-pay | Admitting: Emergency Medicine

## 2023-05-05 NOTE — Telephone Encounter (Signed)
I came to reevaluate Rhonda [name] after 2x doses of metolazone. Pt has begun to lose weight. I noted decreased exertional shortness of breath and decreased edema. Pt continues to complain of an upper respiratory symptoms with a non-productive cough and reports that she is seeing her PCP tomorrow reference same. I assisted pt getting her compression socks on and confirmed her medication compliance. I assisted pt in completing a Medtronic manual transmission.    Weight: 220 lbs   LEE: +2 on left, +1 on right   BP: 120/62 HR: 76 SPO2: 98% RR: 26   Will reevaluate next week.   [name] Setting EMT-P Community Paramedic  [phone]

## 2023-05-06 ENCOUNTER — Other Ambulatory Visit (HOSPITAL_COMMUNITY): Payer: Self-pay | Admitting: Emergency Medicine

## 2023-05-06 ENCOUNTER — Other Ambulatory Visit (HOSPITAL_COMMUNITY): Payer: Self-pay | Admitting: Psychiatry

## 2023-05-06 DIAGNOSIS — Z6841 Body Mass Index (BMI) 40.0 and over, adult: Secondary | ICD-10-CM | POA: Diagnosis not present

## 2023-05-06 DIAGNOSIS — F411 Generalized anxiety disorder: Secondary | ICD-10-CM

## 2023-05-06 DIAGNOSIS — I5041 Acute combined systolic (congestive) and diastolic (congestive) heart failure: Secondary | ICD-10-CM | POA: Diagnosis not present

## 2023-05-06 DIAGNOSIS — F330 Major depressive disorder, recurrent, mild: Secondary | ICD-10-CM

## 2023-05-06 NOTE — Progress Notes (Signed)
Medication reconciliation based off of orders given in 05/03/23 phone note.   05/05/2023 Prince Rome, NP advised, "Please arrange for 2 days on metolazone 2.5 mg + extra 40 KCL. She will need repeat BMET in 7-10 days."   I added Metolazone for today and tomorrow with correlating potassium dose.   Will follow up on Monday.   [name] Setting EMT-P Community Paramedic  [phone]

## 2023-05-07 ENCOUNTER — Other Ambulatory Visit (HOSPITAL_COMMUNITY): Payer: Self-pay | Admitting: Emergency Medicine

## 2023-05-07 ENCOUNTER — Telehealth: Payer: Self-pay

## 2023-05-07 ENCOUNTER — Telehealth (HOSPITAL_COMMUNITY): Payer: Self-pay | Admitting: Cardiology

## 2023-05-07 ENCOUNTER — Other Ambulatory Visit (HOSPITAL_COMMUNITY): Payer: Medicare HMO

## 2023-05-07 NOTE — Telephone Encounter (Signed)
Your provider has order Furoscix for you. This is an on-body infuser that gives you a dose of Furosemide.     Ensure you write down the time you start your infusion so that if there is a problem you will know how long the infusion lasted  Use Furoscix only AS DIRECTED by our office  Dosing Directions:   Day 1= use one infuser, hold metolazone and torsemide-take potassium 40 meq daily  Day 2= use one infuser, hold metolazone and torsemide-take potassium 40 meq daily  Day 3= use one infuser, hold metolazone and torsemide-take potassium 40 meq daily  Day 4 restart normal dose of torsemide 100 twice a day, metolazone once a week every other week and potassium three times daily  Follow up 12/19 @1040  with Dr Gala Romney

## 2023-05-07 NOTE — Telephone Encounter (Signed)
Medication Samples have been provided to the patient.  Drug name: furoscix       Strength: 80mg /39mL        Qty: 3  LOT: [id number]  Exp.Date: 09/21/24  Dosing instructions: use as directed  The patient has been instructed regarding the correct time, dose, and frequency of taking this medication, including desired effects and most common side effects.   Magda Bernheim M 1:55 PM 05/07/2023

## 2023-05-07 NOTE — Telephone Encounter (Signed)
Clinical staff with PCP called to report  3+ BLE Decreased UOP Lungs clear Weight 219.4lb BMP done at visit 12/12 will fax result once available -above noted per provider  -PCP provider felt increase in diuretics not effective and may need to repeat over the weekend  [facility] [phone]  Please advise

## 2023-05-07 NOTE — Telephone Encounter (Signed)
Pt cardiology office called in stating pt needed to be on diuretic over the weekend due to leg swelling. We have given number to afib clinic who is the ones who manage that since laurie short is out.

## 2023-05-07 NOTE — Progress Notes (Unsigned)
Received call from H&V triage with need to deliver Furosix to Mrs. Smolinsky.  Picked up Furosix and delivered to pt's residence.    Mrs Amaya was A&O per baseline w/ hx of dementia.  Pt was initially home alone and I attempted to review Furosix video w/ pt and explain to here why she was being prescribed this and how it can help eliminate excess fluid.  Placed Furosix device to left abdominal area w/o inicident.    During the visit her sitter arrived and I reviewed the video w/ her and walked her through how to apply tomorrows Furosix's unit.  She demonstrated she understood the process.  Also, Mr. Gunsolus arrived home I discussed with him as well as text him the Furosix video.  I removed all Torsemide doses for the next three days and included Potassium per orders received from clinic.  Pt's caregiver will come by Saturday morning to assist applying the next dosing and review with pt's husband for Sunday's administration.    Beatrix Shipper, EMT-Paramedic [phone] 05/09/2023

## 2023-05-07 NOTE — Telephone Encounter (Signed)
PT aware via husband  DEE DEE with para medicine to assist with furoscix sample pick up/education  Pt will return to office for recheck/labs 12/19 -husband aware

## 2023-05-08 ENCOUNTER — Telehealth: Payer: Self-pay | Admitting: Physician Assistant

## 2023-05-08 NOTE — Telephone Encounter (Signed)
Pt husband called. They received three doses of Furoscix. Unfortunately, today's dose malfunctioned. They attempted to do tomorrow's dose, but also malfunctioned. Discussed with Dr. Gala Romney, restart torsemide as usual and call clinic on Monday. They expressed understanding of the plan.   Will forward to triage and Meredith Staggers, per Dr. Prescott Gum request.

## 2023-05-10 ENCOUNTER — Other Ambulatory Visit (HOSPITAL_COMMUNITY): Payer: Self-pay | Admitting: Emergency Medicine

## 2023-05-10 NOTE — Telephone Encounter (Signed)
Pt pts husband would like to continue torsemide until follow up 12/18  Reported frustration with fursocix usage over  the weekend.  [place] Dec with paramed scheduled for home visit 12/16,

## 2023-05-10 NOTE — Progress Notes (Signed)
Paramedicine Encounter    Patient ID: Rhonda [name], female    DOB: 1950-06-15, 72 y.o.   MRN: [id number]   Complaints - pain in feet/swelling   Assessment - no edema. Lung sounds clear.   Compliance with meds - missed 2x lunch doses.   Pill box filled - for 1x week   Refills needed - bentyl  Meds changes since last visit - Furosix Friday, Sat, and Sunday.   Social changes - none   VISIT SUMMARY**  I met with Rhonda [name] today. She was down in her weight and feeling much better. Pt was noted to have no exertional shortness of breath and no complaints from pt. Pt did seem to have increased memory concerns from last week, not remembering her 3 furosix infusions over the weekend. Pt's pill box was filled for 1x week. Will follow up in 1x week.   BP 130/76   Pulse 84   Resp 16   Wt 211 lb 3.2 oz (95.8 kg)   SpO2 96%   BMI 39.26 kg/m  Weight yesterday-DNW Last visit weight-219 lbs  [name] EMT-P Community Paramedic  [phone]    ACTION: Home visit completed     Patient Care Team: Shon Hale, MD as PCP - General (Family Medicine) Burna Sis, LCSW as Social Worker (Licensed Clinical Social Worker) [name], Noberto Retort, DO as Consulting Physician (Neurology) [name], Paramedic as Paramedic Chanetta Marshall, Meridee Score, MD as Attending Physician (Family Medicine) Clinton Gallant, RN as [facility] Care Management Bensimhon, Bevelyn Buckles, MD as Consulting Physician (Cardiology) Cleotis Nipper, MD as Consulting Physician (Psychiatry) Candelaria Stagers, DPM as Consulting Physician (Podiatry)  Patient Active Problem List   Diagnosis Date Noted   Mild dementia 04/20/2023   Diabetic neuropathy    Elevated troponin 12/02/2021   Acute left ankle pain 10/11/2021   Hyponatremia 10/08/2021   Decreased estrogen level 08/01/2021   Hyperparathyroidism 08/01/2021   Spondylolisthesis 08/01/2021   Varicose veins of bilateral lower extremities with other  complications 08/01/2021   Lumbago without sciatica 04/30/2021   Abnormal gait 06/11/2020   Allergic rhinitis 06/11/2020   Asthma 06/11/2020   Benign intracranial hypertension 06/11/2020   Body mass index (BMI) 45.0-49.9, adult 06/11/2020   Bowel incontinence 06/11/2020   Cholelithiasis 06/11/2020   Chronic sinusitis 06/11/2020   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   Daytime somnolence 06/11/2020   Edema 06/11/2020   Family history of malignant neoplasm of gastrointestinal tract 06/11/2020   Gout 06/11/2020   Insomnia 06/11/2020   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   Lumbar spondylosis with myelopathy 06/11/2020   Macrocytosis 06/11/2020   History of colonic polyps 06/11/2020   Repeated falls 06/11/2020   Irritable bowel syndrome 01/04/2020   Spinal stenosis of lumbar region 01/03/2020   Bilateral leg weakness 08/01/2019   Type II diabetes mellitus 06/15/2019   Hypokalemia 11/08/2018   CKD (chronic kidney disease) stage 3, GFR 30-59 ml/min 11/08/2018   Orthostatic hypotension 07/28/2017   On home oxygen therapy    Migraines    Left bundle branch block    Hypothyroidism    Hypertension    Hyperlipidemia    Heart murmur    GERD (gastroesophageal reflux disease)    Exertional shortness of breath    Major depressive disorder    Back pain    Arthritis    Generalized anxiety disorder    Anemia    Chronic respiratory failure 09/14/2013   Biventricular ICD (implantable cardioverter-defibrillator) in place 08/04/2013   Chronic systolic  heart failure 10/27/2012   Endometrial polyp 01/20/2012   Malignant tumor of breast 03/26/2011   Vitamin D deficiency 03/26/2011    Current Outpatient Medications:    acetaZOLAMIDE (DIAMOX) 125 MG tablet, Take 1 tablet (125 mg total) by mouth daily., Disp: 90 tablet, Rfl: 3   Ascorbic Acid (VITAMIN C) 1000 MG tablet, Take 1,000 mg by mouth daily., Disp: , Rfl:    azelastine (ASTELIN) 0.1 % nasal spray, Place 2 sprays into both nostrils 2 (two) times daily.  Use in each nostril as directed, Disp: 30 mL, Rfl: 12   buPROPion (WELLBUTRIN XL) 150 MG 24 hr tablet, Take 1 tablet (150 mg total) by mouth daily., Disp: 30 tablet, Rfl: 2   carvedilol (COREG) 3.125 MG tablet, TAKE 1 TABLET (3.125 MG) BY MOUTH TWICE DAILY WITH MEALS, Disp: 180 tablet, Rfl: 3   citalopram (CELEXA) 20 MG tablet, Take 1 tablet (20 mg total) by mouth daily., Disp: 30 tablet, Rfl: 2   clonazePAM (KLONOPIN) 0.5 MG tablet, Take 1 tablet (0.5 mg total) by mouth 2 (two) times daily., Disp: 60 tablet, Rfl: 2   cyanocobalamin (VITAMIN B12) 1000 MCG tablet, Take 1,000 mcg by mouth daily., Disp: , Rfl:    desloratadine (CLARINEX) 5 MG tablet, Take 1 tablet (5 mg total) by mouth daily., Disp: 90 tablet, Rfl: 3   dicyclomine (BENTYL) 20 MG tablet, Take 20 mg by mouth 3 (three) times daily before meals., Disp: , Rfl:    fluticasone (FLONASE) 50 MCG/ACT nasal spray, Place 1 spray into both nostrils daily., Disp: , Rfl:    gabapentin (NEURONTIN) 100 MG capsule, TAKE 1 CAPSULE (100 MG TOTAL) BY MOUTH 2 (TWO) TIMES DAILY., Disp: 180 capsule, Rfl: 0   levothyroxine (SYNTHROID) 88 MCG tablet, TAKE 1 TABLET(88 MCG) BY MOUTH DAILY BEFORE BREAKFAST, Disp: 90 tablet, Rfl: 3   memantine (NAMENDA) 5 MG tablet, Take 1 tablet (5 mg total) by mouth daily., Disp: 30 tablet, Rfl: 3   metolazone (ZAROXOLYN) 2.5 MG tablet, TAKE 1 TABLET BY MOUTH EVERY OTHER TUESDAY., Disp: 12 tablet, Rfl: 3   midodrine (PROAMATINE) 5 MG tablet, Take 1 tablet (5 mg total) by mouth 3 (three) times daily., Disp: 200 tablet, Rfl: 3   Multiple Vitamin (MULTIVITAMIN WITH MINERALS) TABS tablet, Take 1 tablet by mouth daily., Disp: , Rfl:    Ondansetron HCl (ZOFRAN PO), 4 mg. Take 1 tablet on the tongue and allow to dissolve, Disp: , Rfl:    potassium chloride SA (KLOR-CON M) 20 MEQ tablet, TAKE 2 TABLETS THREE TIMES DAILY. TAKE AN EXTRA 2 TABLETS ON DAYS YOU TAKE METOLAZONE, Disp: 572 tablet, Rfl: 3   risperiDONE (RISPERDAL) 0.5 MG tablet,  Take 1 tablet (0.5 mg total) by mouth 2 (two) times daily at 8 am and 4 pm., Disp: 60 tablet, Rfl: 2   simvastatin (ZOCOR) 10 MG tablet, TAKE 1 TABLET EVERY EVENING, Disp: 90 tablet, Rfl: 3   spironolactone (ALDACTONE) 25 MG tablet, TAKE 1 TABLET (25 MG TOTAL) BY MOUTH EVERY EVENING., Disp: 90 tablet, Rfl: 3   torsemide (DEMADEX) 20 MG tablet, Take 5 tablets (100 mg total) by mouth 2 (two) times daily., Disp: 900 tablet, Rfl: 2 Allergies  Allergen Reactions   Ceftin Anaphylaxis    Face and throat swell    Cefuroxime Axetil Anaphylaxis    Face and throat swell   Geodon [Ziprasidone Hcl] Hives   Lisinopril Other (See Comments)    angioedema   Cefuroxime     Other reaction(s): anaphylaxis  Sulfacetamide Sodium-Sulfur     Other reaction(s): itch   Allopurinol Nausea Only and Other (See Comments)    weakness   Ativan [Lorazepam] Itching   Sulfa Antibiotics Itching   Valium [Diazepam] Other (See Comments)    Patient states that diazepam doesn't relax, it has the opposite effect.     Social History   Socioeconomic History   Marital status: Married    Spouse name: Not on file   Number of children: 2   Years of education: 5   Highest education level: Master's degree (e.g., MA, MS, MEng, MEd, MSW, MBA)  Occupational History   Occupation: retired  Tobacco Use   Smoking status: Some Days    Current packs/day: 0.00    Average packs/day: 0.1 packs/day for 26.0 years (2.6 ttl pk-yrs)    Types: Cigarettes    Start date: 03/24/1995    Last attempt to quit: 03/23/2021    Years since quitting: 2.1   Smokeless tobacco: Never  Vaping Use   Vaping status: Never Used  Substance and Sexual Activity   Alcohol use: No   Drug use: No   Sexual activity: Yes  Other Topics Concern   Not on file  Social History Narrative   Tobacco Use Cigarettes: Former Smoker, Quit in 2008   No Alcohol   No recreational drug use   Diet: Regular/Low Carb   Exercise: None   Occupation: disabled    Education: Company secretary, masters   Children: 2   Firearms: No   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      Social Drivers of Health   Financial Resource Strain: Not on file  Food Insecurity: No Food Insecurity (03/31/2023)   Hunger Vital Sign    Worried About Running Out of Food in the Last Year: Never true    Ran Out of Food in the Last Year: Never true  Transportation Needs: No Transportation Needs (03/31/2023)   PRAPARE - Administrator, Civil Service (Medical): No    Lack of Transportation (Non-Medical): No  Physical Activity: Not on file  Stress: No Stress Concern Present (08/11/2022)   [name] of Occupational Health - Occupational Stress Questionnaire    Feeling of Stress : Only a little  Social Connections: Not on file  Intimate Partner Violence: Not At Risk (07/27/2022)   Humiliation, Afraid, Rape, and Kick questionnaire    Fear of Current or Ex-Partner: No    Emotionally Abused: No    Physically Abused: No    Sexually Abused: No    Physical Exam      Future Appointments  Date Time Provider Department Center  05/13/2023 10:40 AM Bensimhon, Bevelyn Buckles, MD [facility] None  05/20/2023  7:05 AM [facility] DEVICE REMOTES [facility]  06/02/2023  1:45 PM Freddie Breech, DPM [facility]  06/02/2023  2:00 PM Sheron Nightingale, RD [facility]  06/03/2023 11:30 AM [name] D, LCSW [facility] None  06/10/2023  3:20 PM Arfeen, Phillips Grout, MD [facility] None  06/14/2023  7:10 AM [facility] DEVICE REMOTES [facility]  06/29/2023  1:30 PM Nita Sickle K, DO [facility] None  09/13/2023  7:10 AM [facility] DEVICE REMOTES [facility]

## 2023-05-12 ENCOUNTER — Other Ambulatory Visit (HOSPITAL_COMMUNITY): Payer: Medicare HMO

## 2023-05-13 ENCOUNTER — Encounter (HOSPITAL_COMMUNITY): Payer: Self-pay | Admitting: Internal Medicine

## 2023-05-13 ENCOUNTER — Ambulatory Visit (HOSPITAL_COMMUNITY)
Admission: RE | Admit: 2023-05-13 | Discharge: 2023-05-13 | Disposition: A | Discharge status: Home / Self Care | Payer: Medicare HMO | Source: Ambulatory Visit | Attending: Internal Medicine | Admitting: Internal Medicine

## 2023-05-13 ENCOUNTER — Other Ambulatory Visit (HOSPITAL_COMMUNITY): Payer: Self-pay | Admitting: Internal Medicine

## 2023-05-13 VITALS — BP 130/62 | HR 78 | Ht 64.5 in | Wt 216.2 lb

## 2023-05-13 DIAGNOSIS — Z79899 Other long term (current) drug therapy: Secondary | ICD-10-CM | POA: Diagnosis not present

## 2023-05-13 DIAGNOSIS — I951 Orthostatic hypotension: Secondary | ICD-10-CM | POA: Insufficient documentation

## 2023-05-13 DIAGNOSIS — Z6836 Body mass index (BMI) 36.0-36.9, adult: Secondary | ICD-10-CM | POA: Insufficient documentation

## 2023-05-13 DIAGNOSIS — I130 Hypertensive heart and chronic kidney disease with heart failure and stage 1 through stage 4 chronic kidney disease, or unspecified chronic kidney disease: Secondary | ICD-10-CM | POA: Insufficient documentation

## 2023-05-13 DIAGNOSIS — I872 Venous insufficiency (chronic) (peripheral): Secondary | ICD-10-CM | POA: Diagnosis not present

## 2023-05-13 DIAGNOSIS — N183 Chronic kidney disease, stage 3 unspecified: Secondary | ICD-10-CM | POA: Diagnosis not present

## 2023-05-13 DIAGNOSIS — I5022 Chronic systolic (congestive) heart failure: Secondary | ICD-10-CM | POA: Diagnosis not present

## 2023-05-13 DIAGNOSIS — E1122 Type 2 diabetes mellitus with diabetic chronic kidney disease: Secondary | ICD-10-CM | POA: Diagnosis not present

## 2023-05-13 DIAGNOSIS — I10 Essential (primary) hypertension: Secondary | ICD-10-CM | POA: Diagnosis not present

## 2023-05-13 DIAGNOSIS — F32A Depression, unspecified: Secondary | ICD-10-CM | POA: Insufficient documentation

## 2023-05-13 DIAGNOSIS — N1832 Chronic kidney disease, stage 3b: Secondary | ICD-10-CM | POA: Diagnosis not present

## 2023-05-13 DIAGNOSIS — J9610 Chronic respiratory failure, unspecified whether with hypoxia or hypercapnia: Secondary | ICD-10-CM | POA: Insufficient documentation

## 2023-05-13 DIAGNOSIS — F03A4 Unspecified dementia, mild, with anxiety: Secondary | ICD-10-CM | POA: Diagnosis not present

## 2023-05-13 LAB — BRAIN NATRIURETIC PEPTIDE: B Natriuretic Peptide: 104.5 pg/mL — ABNORMAL HIGH (ref 0.0–100.0)

## 2023-05-13 LAB — BASIC METABOLIC PANEL
Anion gap: 9 (ref 5–15)
BUN: 43 mg/dL — ABNORMAL HIGH (ref 8–23)
CO2: 25 mmol/L (ref 22–32)
Calcium: 8.5 mg/dL — ABNORMAL LOW (ref 8.9–10.3)
Chloride: 100 mmol/L (ref 98–111)
Creatinine, Ser: 1.95 mg/dL — ABNORMAL HIGH (ref 0.44–1.00)
GFR, Estimated: 27 mL/min — ABNORMAL LOW (ref >60)
Glucose, Bld: 135 mg/dL — ABNORMAL HIGH (ref 70–99)
Potassium: 3.6 mmol/L (ref 3.5–5.1)
Sodium: 134 mmol/L — ABNORMAL LOW (ref 135–145)

## 2023-05-13 NOTE — Progress Notes (Signed)
ADVANCED HF CLINIC NOTE   Patient ID: Rhonda [name], female   DOB: 11-29-1950, 72 y.o.   MRN: [id number]  Primary Cardiologist: Dr Mayford Knife  General Surgeon: Dr Dwain Sarna  Orthopedic: Dr Charlann Boxer  PCP: Dr Hyman Hopes HF Cardiologist: Dr. Gala Romney  HPI: Rhonda [name] is a 72 y.o. 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 She has had sleep study with no  evidence of sleep apnea in remote past.  She has a Medtronic CRT-D device. Echo in 7/18 showed recovery of EF to 55-60%. PYP 09/02/17 negative for TTR (Grade 0-1, H/CCL 1.2). SPEP with no M-spike.   Has been followed by Nita Sickle in Neurology. Has been on Florinef and midodrine for orthostatic hypotension (failed mestinon).   Seen by Dr. Marisue Humble and SCr slightly worse so Florinef stopped. Echo 1/23 EF 60-65%, RV ok  Follow up 2/23, SCr trending up and metolazone changed to every other Tuesday.   Admitted 5/23 with AKI. Resuscitated with IVF, Florinef and midodrine restarted. SCr stabilized at 1.6-1.8.  Last seen in the [facility] 8/23. Was doing well at the time and enrolled w/ paramedicine. Also followed in [facility].   Follow up 7/24, volume mildly up and BP elevated. Midodrine decreased to 5 bid, repeat echo arranged.  Echo 12/30/22, EF 55-60% G1DD RV normal. Personally reviewed  Here with her husband. Remains SOB with mild activity. Able to do ADLs. Followed by Paramedicine. Has not been weighing.    Cardiac Studies  - Echo (5/14): EF 35% RV ok  - Echo (11/14): EF 20-25%, LV moderately dilated - Echo (5/15): EF 45-50% - Echo (7/18): EF 55-60%, normal RV size and systolic function, PASP 34  mmHg - PYP (4/19): negative TTR - Echo (1/21): EF 60-65% grade II DD. RV ok - Echo (1/23): EF 60-65%, RV ok   7/14 Creatinine 1.2, K 3.8 10/14 Creatinine 1.68, K 4.4  12/14 Creatinine 1.8, K 4.2  5/15 Creatinine 1.69, K 3.7  10/17 Creatinine 1.35, K 4.1 5/19 Creatinine 1.57, K 3.8  Review of systems complete and found to be negative unless listed in HPI.   Past Medical History:  Diagnosis Date   Abnormal gait 06/11/2020   Acute left ankle pain 10/11/2021   AKI (acute kidney injury) 10/08/2021   Allergic rhinitis 06/11/2020   Anemia    Arthritis    Right knee   Asthma 06/11/2020   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   Back pain    Disk problem   Benign intracranial hypertension 06/11/2020   Bilateral leg weakness 08/01/2019   Biventricular ICD (implantable cardioverter-defibrillator) in place 08/04/2013   Body mass index (BMI) 45.0-49.9, adult 06/11/2020   Bowel incontinence 06/11/2020   Cardiomyopathy    Cholelithiasis 06/11/2020   Chronic respiratory failure 09/14/2013   Chronic sinusitis 06/11/2020   Chronic systolic heart failure 10/27/2012   a) NICM b) ECHO (03/2013) EF 20-25% c) ECHO (09/2013) EF 45-50%, grade I DD   CKD (chronic kidney disease) stage 3, GFR 30-59 ml/min 11/08/2018   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   Complication of anesthesia    History of low blood pressure after surgery; attributed to lying flat   Daytime somnolence 06/11/2020  Decreased estrogen level 08/01/2021   Diabetic neuropathy    Diarrhea of presumed infectious origin 06/11/2020   Edema 06/11/2020   Elevated troponin 12/02/2021   Endometrial polyp 01/20/2012   Exertional shortness of breath    Generalized anxiety disorder    GERD (gastroesophageal reflux disease)    Gout 06/11/2020   Heart murmur    History of colonic polyps 06/11/2020   History of fall 06/11/2020   Hyperlipidemia    Hyperparathyroidism 08/01/2021   Hypertension    Hypokalemia 11/08/2018   Hyponatremia 10/08/2021    Hypothyroidism    Insomnia 06/11/2020   Intractable nausea and vomiting 10/08/2021   Irritable bowel syndrome 01/04/2020   Left bundle branch block    s/p CRT-D (04/2013)   Lumbago without sciatica 04/30/2021   Lumbar spondylosis with myelopathy 06/11/2020   Macrocytosis 06/11/2020   Major depressive disorder    Malignant tumor of breast 03/26/2011   Left; completed chemotherapy and radiation treatments   Migraines    Mild dementia 04/20/2023   On home oxygen therapy    "2L suppose to be q night" (05/03/2013)   Orthostatic hypotension 07/28/2017   Spinal stenosis of lumbar region 01/03/2020   Spondylolisthesis 08/01/2021   SVD (spontaneous vaginal delivery)    x 2   Type II diabetes mellitus 06/15/2019   Varicose veins of bilateral lower extremities with other complications 08/01/2021   Vitamin D deficiency 03/26/2011   Current Outpatient Medications  Medication Sig Dispense Refill   acetaZOLAMIDE (DIAMOX) 125 MG tablet Take 1 tablet (125 mg total) by mouth daily. 90 tablet 3   Ascorbic Acid (VITAMIN C) 1000 MG tablet Take 1,000 mg by mouth daily.     azelastine (ASTELIN) 0.1 % nasal spray Place 2 sprays into both nostrils 2 (two) times daily. Use in each nostril as directed 30 mL 12   buPROPion (WELLBUTRIN XL) 150 MG 24 hr tablet Take 1 tablet (150 mg total) by mouth daily. 30 tablet 2   carvedilol (COREG) 3.125 MG tablet TAKE 1 TABLET (3.125 MG) BY MOUTH TWICE DAILY WITH MEALS 180 tablet 3   citalopram (CELEXA) 20 MG tablet Take 1 tablet (20 mg total) by mouth daily. 30 tablet 2   clonazePAM (KLONOPIN) 0.5 MG tablet Take 1 tablet (0.5 mg total) by mouth 2 (two) times daily. 60 tablet 2   cyanocobalamin (VITAMIN B12) 1000 MCG tablet Take 1,000 mcg by mouth daily.     desloratadine (CLARINEX) 5 MG tablet Take 1 tablet (5 mg total) by mouth daily. 90 tablet 3   dicyclomine (BENTYL) 20 MG tablet Take 20 mg by mouth 3 (three) times daily before meals.     fluticasone (FLONASE) 50  MCG/ACT nasal spray Place 1 spray into both nostrils daily.     gabapentin (NEURONTIN) 100 MG capsule TAKE 1 CAPSULE (100 MG TOTAL) BY MOUTH 2 (TWO) TIMES DAILY. 180 capsule 0   levothyroxine (SYNTHROID) 88 MCG tablet TAKE 1 TABLET(88 MCG) BY MOUTH DAILY BEFORE BREAKFAST 90 tablet 3   memantine (NAMENDA) 5 MG tablet Take 1 tablet (5 mg total) by mouth daily. 30 tablet 3   metolazone (ZAROXOLYN) 2.5 MG tablet TAKE 1 TABLET BY MOUTH EVERY OTHER TUESDAY. 12 tablet 3   midodrine (PROAMATINE) 5 MG tablet Take 1 tablet (5 mg total) by mouth 3 (three) times daily. 200 tablet 3   Multiple Vitamin (MULTIVITAMIN WITH MINERALS) TABS tablet Take 1 tablet by mouth daily.     Ondansetron HCl (ZOFRAN PO) 4 mg. Take 1  tablet on the tongue and allow to dissolve     potassium chloride SA (KLOR-CON M) 20 MEQ tablet TAKE 2 TABLETS THREE TIMES DAILY. TAKE AN EXTRA 2 TABLETS ON DAYS YOU TAKE METOLAZONE 572 tablet 3   risperiDONE (RISPERDAL) 0.5 MG tablet Take 1 tablet (0.5 mg total) by mouth 2 (two) times daily at 8 am and 4 pm. 60 tablet 2   simvastatin (ZOCOR) 10 MG tablet TAKE 1 TABLET EVERY EVENING 90 tablet 3   spironolactone (ALDACTONE) 25 MG tablet TAKE 1 TABLET (25 MG TOTAL) BY MOUTH EVERY EVENING. 90 tablet 3   torsemide (DEMADEX) 20 MG tablet Take 5 tablets (100 mg total) by mouth 2 (two) times daily. 900 tablet 2   No current facility-administered medications for this encounter.   BP 130/62   Pulse 78   Ht 5' 4.5" (1.638 m)   Wt 98.1 kg (216 lb 3.2 oz)   SpO2 99%   BMI 36.54 kg/m   Wt Readings from Last 3 Encounters:  05/13/23 98.1 kg (216 lb 3.2 oz)  05/10/23 95.8 kg (211 lb 3.2 oz)  05/06/23 99.5 kg (219 lb 6.4 oz)    PHYSICAL EXAM: General:  Sitting on exam table. No resp difficulty HEENT: normal Neck: supple. no JVD. Carotids 2+ bilat; no bruits. No lymphadenopathy or thryomegaly appreciated. Cor: PMI nondisplaced. Regular rate & rhythm. No rubs, gallops or murmurs. Lungs: clear Abdomen:  obese soft, nontender, nondistended. No hepatosplenomegaly. No bruits or masses. Good bowel sounds. Extremities: no cyanosis, clubbing, rash, edema + vericose veins  Neuro: alert & orientedx3, cranial nerves grossly intact. moves all 4 extremities w/o difficulty. Affect pleasant  Device interrogation (personally reviewed): OptiVol up, thoracic impedence down, no AF, 0.2 hr/day activity, no VT  ASSESSMENT & PLAN:  1) Chronic systolic HF with recovered EF/nonischemic cardiomyopathy:  - NICM s/p Medtronic CRT-D, ? Viral myocarditis. Cardiomyopathy dates from 49.  - Echo (5/15): EF 45-50% - Echo (2020): EF up to 55-60%.  - PYP (4/19) negative for TTR (Grade 0-1, H/CCL 1.2). SPEP with no M-spike.  - Echo (1/21): EF 60-65% grade II DD. RV ok.  - Echo (1/23): EF 60-65% RV normal.  - Echo 12/30/22, EF 55-60% G1DD RV normal. Personally reviewed - Stable NYHA III Functional status limited by obesity, deconditioning and depression. - Volume ok and may be a bit low on higher dose of torsemide (Reds 23%) - Continue torsemide 80 bid (recently increased)  Continue metolazone 2.5 mg/40 KCL every other Tuesday. - Continue carvedilol 3.125 mg bid. - Continue spironolactone 25 mg daily.  - Continue midodrine 5 mg tid. BP ok  - No SGT2i with chronic yeast infections. - C/w paramedicine  - Check labs. If SCr up may bump metolazone back to once a week.   2) Obesity - Body mass index is 36.54 kg/m. - Encouraged PREP program. - Will refer to PharmD for potential GLP1RA  3) HTN - BP improved continue midorine  4) Orthostatic Hypotension.  - Neurology managing. She is on diamox for increased ICP - Continue midodrine as above   5) Chronic Venous stasis - Encouraged her to use compression stockings    6) Falls  - No recent falls. - Follows with Neurology.   7) CKD 3 - Baseline SCr 1.4-1.6 - Follows with Dr. Marisue Humble in Nephrology -Labs today   Arvilla Meres, MD  11:12 AM

## 2023-05-13 NOTE — Patient Instructions (Signed)
Good to see you today!  Labs done today, your results will be available in MyChart, we will contact you for abnormal readings.  You have been referred to Pharmacy D they will call to schedule an appointment  Your physician recommends that you schedule a follow-up appointment in: as scheduled  If you have any questions or concerns before your next appointment please send [place] a message through [facility] or call our office at [phone].    TO LEAVE A MESSAGE FOR THE NURSE SELECT OPTION 2, PLEASE LEAVE A MESSAGE INCLUDING: YOUR NAME DATE OF BIRTH CALL BACK NUMBER REASON FOR CALL**this is important as we prioritize the call backs  YOU WILL RECEIVE A CALL BACK THE SAME DAY AS LONG AS YOU CALL BEFORE 4:00 PM  At the [facility], you and your health needs are our priority. As part of our continuing mission to provide you with exceptional heart care, we have created designated Provider Care Teams. These Care Teams include your primary Cardiologist (physician) and Advanced Practice Providers (APPs- Physician Assistants and Nurse Practitioners) who all work together to provide you with the care you need, when you need it.   You may see any of the following providers on your designated Care Team at your next follow up: Dr Arvilla Meres Dr Marca Ancona Dr. Dorthula Nettles Dr. Clearnce Hasten Amy Filbert Schilder, NP Robbie Lis, [place] [facility] [place], [place] Brynda Peon, NP [place] Lee, NP Karle Plumber, PharmD   Please be sure to bring in all your medications bottles to every appointment.    Thank you for choosing [facility]

## 2023-05-13 NOTE — Progress Notes (Signed)
ReDS Vest / Clip - 05/13/23 1100       ReDS Vest / Clip   Station Marker B    Ruler Value 34    ReDS Value Range Low volume    ReDS Actual Value 23

## 2023-05-14 ENCOUNTER — Telehealth (HOSPITAL_COMMUNITY): Payer: Self-pay

## 2023-05-14 DIAGNOSIS — I5022 Chronic systolic (congestive) heart failure: Secondary | ICD-10-CM

## 2023-05-14 MED ORDER — METOLAZONE 2.5 MG PO TABS: ORAL_TABLET | ORAL | 1 refills | Status: DC

## 2023-05-14 NOTE — Telephone Encounter (Signed)
-----   Message from Arvilla Meres sent at 05/14/2023 10:42 AM EST ----- Creatinine up a bit   Decrease metolazone to once a week  Repeat bmet in 2 weeks

## 2023-05-14 NOTE — Telephone Encounter (Signed)
Patient's metolazone medication was sent to her pharmacy and updated in her chart. In addition, pt's labs placed and appointment scheduled. Staff message was also sent to Sarah in parmedicine per pt's husband request. Pt's husband aware, agreeable, and verbalized understanding.

## 2023-05-17 ENCOUNTER — Other Ambulatory Visit (HOSPITAL_COMMUNITY): Payer: Self-pay | Admitting: Emergency Medicine

## 2023-05-17 NOTE — Progress Notes (Unsigned)
Paramedicine Encounter    Patient ID: Rhonda [name], female    DOB: [birth year], 72 y.o.   MRN: [id number]   Complaints NONE  Assessment A&O per norm w/ hx of dementia  Compliance with meds Missed Sat p.m. dose  Pill box filled x 1 week  Refills needed NONE  Meds changes since last visit NONE    Social changes NONE   BP 118/77 (BP Location: Right Arm, Patient Position: Sitting, Cuff Size: Normal)   Pulse 77   Resp 16   SpO2 97%  Weight yesterday- not taken Last visit weight-211lb  ATF Rhonda [name] resting in bed watching television.  She denies chest pain or SOB.  Lung sounds clear and equal bilat.  Lower extremities are markedly improved and show no pitting edema.  Med box reconciled x 1 week.   Pt remained resting in bed watching TV at end of visit.  ACTION: Home visit completed  Bethanie Dicker [phone] 05/17/23  Patient Care Team: Shon Hale, MD as PCP - General (Family Medicine) Burna Sis, LCSW as Social Worker (Licensed Clinical Social Worker) [name], Noberto Retort, DO as Consulting Physician (Neurology) [name], Paramedic as Paramedic Chanetta Marshall, Meridee Score, MD as Attending Physician (Family Medicine) Clinton Gallant, RN as [facility] Care Management Bensimhon, Bevelyn Buckles, MD as Consulting Physician (Cardiology) Cleotis Nipper, MD as Consulting Physician (Psychiatry) Candelaria Stagers, DPM as Consulting Physician (Podiatry)  Patient Active Problem List   Diagnosis Date Noted   Mild dementia 04/20/2023   Diabetic neuropathy    Elevated troponin 12/02/2021   Acute left ankle pain 10/11/2021   Hyponatremia 10/08/2021   Decreased estrogen level 08/01/2021   Hyperparathyroidism 08/01/2021   Spondylolisthesis 08/01/2021   Varicose veins of bilateral lower extremities with other complications 08/01/2021   Lumbago without sciatica 04/30/2021   Abnormal gait 06/11/2020   Allergic rhinitis 06/11/2020   Asthma 06/11/2020    Benign intracranial hypertension 06/11/2020   Body mass index (BMI) 45.0-49.9, adult 06/11/2020   Bowel incontinence 06/11/2020   Cholelithiasis 06/11/2020   Chronic sinusitis 06/11/2020   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   Daytime somnolence 06/11/2020   Edema 06/11/2020   Family history of malignant neoplasm of gastrointestinal tract 06/11/2020   Gout 06/11/2020   Insomnia 06/11/2020   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   Lumbar spondylosis with myelopathy 06/11/2020   Macrocytosis 06/11/2020   History of colonic polyps 06/11/2020   Repeated falls 06/11/2020   Irritable bowel syndrome 01/04/2020   Spinal stenosis of lumbar region 01/03/2020   Bilateral leg weakness 08/01/2019   Type II diabetes mellitus 06/15/2019   Hypokalemia 11/08/2018   CKD (chronic kidney disease) stage 3, GFR 30-59 ml/min 11/08/2018   Orthostatic hypotension 07/28/2017   On home oxygen therapy    Migraines    Left bundle branch block    Hypothyroidism    Hypertension    Hyperlipidemia    Heart murmur    GERD (gastroesophageal reflux disease)    Exertional shortness of breath    Major depressive disorder    Back pain    Arthritis    Generalized anxiety disorder    Anemia    Chronic respiratory failure 09/14/2013   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 Use in each nostril as directed, Disp: 30 mL, Rfl: 12   buPROPion (WELLBUTRIN XL) 150 MG 24 hr tablet, Take 1 tablet (150 mg total) by mouth  daily., Disp: 30 tablet, Rfl: 2   carvedilol (COREG) 3.125 MG tablet, TAKE 1 TABLET (3.125 MG) BY MOUTH TWICE DAILY WITH MEALS, Disp: 180 tablet, Rfl: 3   citalopram (CELEXA) 20 MG tablet, Take 1 tablet (20 mg total) by mouth daily., Disp: 30 tablet, Rfl: 2   clonazePAM (KLONOPIN) 0.5 MG tablet, Take 1 tablet (0.5 mg total) by mouth 2 (two) times daily., Disp: 60 tablet, Rfl: 2   cyanocobalamin (VITAMIN B12) 1000 MCG tablet, Take 1,000 mcg by mouth daily., Disp: , Rfl:    desloratadine (CLARINEX) 5 MG tablet, Take 1 tablet (5 mg total) by mouth daily., Disp: 90 tablet, Rfl: 3   dicyclomine (BENTYL) 20 MG tablet, Take 20 mg by mouth 3 (three) times daily before meals., Disp: , Rfl:    fluticasone (FLONASE) 50 MCG/ACT nasal spray, Place 1 spray into both nostrils daily., Disp: , Rfl:    gabapentin (NEURONTIN) 100 MG capsule, TAKE 1 CAPSULE (100 MG TOTAL) BY MOUTH 2 (TWO) TIMES DAILY., Disp: 180 capsule, Rfl: 0   levothyroxine (SYNTHROID) 88 MCG tablet, TAKE 1 TABLET(88 MCG) BY MOUTH DAILY BEFORE BREAKFAST, Disp: 90 tablet, Rfl: 3   memantine (NAMENDA) 5 MG tablet, Take 1 tablet (5 mg total) by mouth daily., Disp: 30 tablet, Rfl: 3   metolazone (ZAROXOLYN) 2.5 MG tablet, Take 1 tablet by mouth once a week, Disp: 4 tablet, Rfl: 1   midodrine (PROAMATINE) 5 MG tablet, Take 1 tablet (5 mg total) by mouth 3 (three) times daily., Disp: 200 tablet, Rfl: 3   Multiple Vitamin (MULTIVITAMIN WITH MINERALS) TABS tablet, Take 1 tablet by mouth daily., Disp: , Rfl:    Ondansetron HCl (ZOFRAN PO), 4 mg. Take 1 tablet on the tongue and allow to dissolve, Disp: , Rfl:    potassium chloride SA (KLOR-CON M) 20 MEQ tablet, TAKE 2 TABLETS THREE TIMES DAILY. TAKE AN EXTRA 2 TABLETS ON DAYS YOU TAKE METOLAZONE, Disp: 572 tablet, Rfl: 3   risperiDONE (RISPERDAL) 0.5 MG tablet, Take 1 tablet (0.5 mg total) by mouth 2 (two) times daily at 8 am and 4 pm., Disp: 60 tablet, Rfl: 2   simvastatin (ZOCOR) 10 MG tablet, TAKE 1 TABLET  EVERY EVENING, Disp: 90 tablet, Rfl: 3   spironolactone (ALDACTONE) 25 MG tablet, TAKE 1 TABLET (25 MG TOTAL) BY MOUTH EVERY EVENING., Disp: 90 tablet, Rfl: 3   torsemide (DEMADEX) 20 MG tablet, Take 5 tablets (100 mg total) by mouth 2 (two) times daily., Disp: 900 tablet, Rfl: 2 Allergies  Allergen Reactions   Ceftin Anaphylaxis    Face and throat swell    Cefuroxime Axetil Anaphylaxis    Face and throat swell   Geodon [Ziprasidone Hcl] Hives   Lisinopril Other (See Comments)    angioedema   Cefuroxime     Other reaction(s): anaphylaxis   Sulfacetamide Sodium-Sulfur     Other reaction(s): itch   Allopurinol Nausea Only and Other (See Comments)    weakness   Ativan [Lorazepam] Itching   Sulfa Antibiotics Itching   Valium [Diazepam] Other (  See Comments)    Patient states that diazepam doesn't relax, it has the opposite effect.     Social History   Socioeconomic History   Marital status: Married    Spouse name: Not on file   Number of children: 2   Years of education: 27   Highest education level: Master's degree (e.g., MA, MS, MEng, MEd, MSW, MBA)  Occupational History   Occupation: retired  Tobacco Use   Smoking status: Some Days    Current packs/day: 0.00    Average packs/day: 0.1 packs/day for 26.0 years (2.6 ttl pk-yrs)    Types: Cigarettes    Start date: 03/24/1995    Last attempt to quit: 03/23/2021    Years since quitting: 2.1   Smokeless tobacco: Never  Vaping Use   Vaping status: Never Used  Substance and Sexual Activity   Alcohol use: No   Drug use: No   Sexual activity: Yes  Other Topics Concern   Not on file  Social History Narrative   Tobacco Use Cigarettes: Former Smoker, Quit in 2008   No Alcohol   No recreational drug use   Diet: Regular/Low Carb   Exercise: None   Occupation: disabled   Education: Company secretary, masters   Children: 2   Firearms: No   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       Social Drivers of Health   Financial Resource Strain: Not on file  Food Insecurity: No Food Insecurity (03/31/2023)   Hunger Vital Sign    Worried About Running Out of Food in the Last Year: Never true    Ran Out of Food in the Last Year: Never true  Transportation Needs: No Transportation Needs (03/31/2023)   PRAPARE - Administrator, Civil Service (Medical): No    Lack of Transportation (Non-Medical): No  Physical Activity: Not on file  Stress: No Stress Concern Present (08/11/2022)   [name] of Occupational Health - Occupational Stress Questionnaire    Feeling of Stress : Only a little  Social Connections: Not on file  Intimate Partner Violence: Not At Risk (07/27/2022)   Humiliation, Afraid, Rape, and Kick questionnaire    Fear of Current or Ex-Partner: No    Emotionally Abused: No    Physically Abused: No    Sexually Abused: No    Physical Exam      Future Appointments  Date Time Provider Department Center  05/20/2023  7:05 AM [facility] DEVICE REMOTES [facility]  05/27/2023 12:00 PM [facility] LAB [facility] None  06/02/2023  1:45 PM Freddie Breech, DPM [facility]  06/02/2023  2:00 PM Sheron Nightingale, RD [facility]  06/03/2023 11:30 AM [name] D, LCSW [facility] None  06/10/2023  3:20 PM Arfeen, Phillips Grout, MD [facility] None  06/14/2023  7:10 AM [facility] DEVICE REMOTES [facility]  06/29/2023  1:30 PM Nita Sickle K, DO [facility] None  06/30/2023  1:30 PM [facility] PHARMACIST [facility]  07/14/2023 11:30 AM [facility] PA/NP [facility] None  09/13/2023  7:10 AM [facility] DEVICE REMOTES [facility]

## 2023-05-18 ENCOUNTER — Telehealth: Payer: Self-pay | Admitting: Licensed Clinical Social Worker

## 2023-05-18 NOTE — Patient Instructions (Signed)
Visit Information  Thank you for taking time to visit with me today. Please don't hesitate to contact me if I can be of assistance to you.   Following are the goals we discussed today:   Goals Addressed             This Visit's Progress    Obtain Supportive Resources   On track    Activities and task to complete in order to accomplish goals.   Keep all upcoming appointments discussed today Continue with compliance of taking medication prescribed by Doctor Implement healthy coping skills discussed to assist with management of symptoms Provide required ppwk to [facility]; who will continue to assist family with applying for Medicaid Review the supportive information provided via mail         Our next appointment is by telephone on 1/9 at 11:30 AM  Please call the care guide team at [phone] if you need to cancel or reschedule your appointment.   If you are experiencing a Mental Health or Behavioral Health Crisis or need someone to talk to, please call the Suicide and Crisis Lifeline: 988 call 911   The patient verbalized understanding of instructions, educational materials, and care plan provided today and DECLINED offer to receive copy of patient instructions, educational materials, and care plan.   [name], MSW, LCSW [facility] Care Management [place]  [facility] [email or web address] Phone [phone] 3:51 PM

## 2023-05-18 NOTE — Patient Outreach (Signed)
  Care Coordination   Follow Up Visit Note   05/18/2023 Name: Rhonda [name] MRN: [id number] DOB: [birth year]  Rhonda [name] is a 72 y.o. year old female who sees Rhonda [name], Rhonda Score, MD for primary care.   What matters to the patients health and wellness today?  LCSW obtained supportive resources to assist with strengthening socialization, physical activity, and assist with caregiver stress    Goals Addressed             This Visit's Progress    Obtain Supportive Resources   On track    Activities and task to complete in order to accomplish goals.   Keep all upcoming appointments discussed today Continue with compliance of taking medication prescribed by Doctor Implement healthy coping skills discussed to assist with [name] of symptoms Provide required ppwk to [facility]; who will continue to assist family with applying for Medicaid Review the supportive information provided via mail         SDOH assessments and interventions completed:  No     Care Coordination Interventions:  Yes, provided  Interventions Today    Flowsheet Row Most Recent Value  Chronic Disease   Chronic disease during today's visit Hypertension (HTN), Other  [Dementia, MDD, GAD]  General Interventions   General Interventions Discussed/Reviewed Community Resources  Exercise Interventions   Exercise Discussed/Reviewed Physical Activity       Follow up plan: Follow up call scheduled for 2-4 weeks    Encounter Outcome:  Patient Visit Completed   Rhonda [name], MSW, LCSW Rhonda [name] [name]  [facility] Rhonda [name] Phone [phone] 3:50 PM

## 2023-05-20 ENCOUNTER — Ambulatory Visit: Payer: Medicare HMO | Attending: Internal Medicine

## 2023-05-20 DIAGNOSIS — Z95810 Presence of automatic (implantable) cardiac defibrillator: Secondary | ICD-10-CM

## 2023-05-20 DIAGNOSIS — I5022 Chronic systolic (congestive) heart failure: Secondary | ICD-10-CM

## 2023-05-20 NOTE — Progress Notes (Signed)
EPIC Encounter for ICM Monitoring  Patient Name: Rhonda [name] is a 72 y.o. female Date: 05/20/2023 Primary Care Physican: Shon Hale, MD Primary Cardiologist: Bensimhon Electrophysiologist: Rae Roam Pacing: 98.5%   06/01/2022 Weight: 193 lbs  07/08/2022 Weight: 183 lbs 08/10/2022 Weight: 185 lbs 11/25/2022 Weight: 180 lbs 12/30/2022 Weight: 184 lbs 04/26/2023 Weight: 214 lbs Per EMT note         Transmission reviewed.               Optivol thoracic impedance suggesting fluid levels returned to normal after Torsemide dosage was increased to 100 mg bid and Metolazone dosage changed from biweekly to weekly.     Prescribed:  [name] Setting with paramedicine program assists with meds. Torsemide 20 mg 5 tablets (100 mg total) by mouth twice a day  Potassium 20 meq 2 tablets (40 mEq total) by mouth 3 (three) times daily.  Take extra 2 tablets on days you take Metolazone.   Metolazone 2.5mg  1 tablet by mouth once a week. Spironolactone 25 mg take 1 tablet by mouth every evening.   Labs: 05/27/2023 BMET Scheduled 05/13/2023 Creatinine 1.95, BUN 43, Potassium 3.6, Sodium 134, GFR 27 05/06/2023 Creatinine 2.00, BUN 33, Potassium 4.3, Sodium 140, GFR 26 03/04/2023 Creatinine 1.62, BUN 40, Potassium 4.4, Sodium 139, GFR 34 01/13/2023 Creatinine 1.62, BUN 28, Potassium 3.5, Sodium 134, GFR 34 12/30/2022 Creatinine 1.64, BUN 31, Potassium 3.6, Sodium 134, GFR 33 12/02/2022 Creatinine 1.68 BUN 23,  Potassium 4.0, Sodium 136, GFR 33 08/01/2022 Creatinine 1.47, BUN 42, Potassium 3.1, Sodium 136, GFR 38  07/26/2022 Creatinine 2.02, BUN 25, Potassium 4.6, Sodium 133, GFR 26  07/22/2022 Creatinine 1.57, BUN 20, Potassium 3.8, Sodium 136, GFR 35  07/14/2022 Creatinine 1.32, BUN 20, Potassium 4.2, Sodium 138, GFR 44 06/25/2022 Creatinine 2.19, BUN 29, Potassium 3.5, Sodium 134, GFR 24 A complete set of results can be found in Results Review.   Recommendations:  No changes.    Follow-up plan:  ICM clinic phone appointment on 06/08/2023.   91 day device clinic remote transmission 06/14/2023.     EP/Cardiology Office Visits:  07/14/2023 with HF clinic.  Recall 03/20/2023 with Dr Ladona Ridgel.     Copy of ICM check sent to Dr. Ladona Ridgel.   3 month ICM trend: 05/20/2023.    12-14 Month ICM trend:     Karie Soda, RN 05/20/2023 8:05 AM

## 2023-05-24 ENCOUNTER — Other Ambulatory Visit (HOSPITAL_COMMUNITY): Payer: Self-pay

## 2023-05-24 NOTE — Progress Notes (Signed)
Paramedicine Encounter    Patient ID: Rhonda [name], female    DOB: [birth year], 72 y.o.   MRN: [id number]   Complaints- Chronic fatigue   Assessment- CAOX4, warm and dry seated in her chair reporting her general fatigue. She denied any increased shortness of breath or chest pain. She has some lower leg swelling but seems at baseline for her. Lungs clear.   Compliance with meds - missed 2x evening doses.   Pill box filled- one week   Refills needed- torsemide  MISSING IN PILL BOX: SAT EVE SUN MORN,EVE MON MORN, EVE    Meds changes since last visit- none     Social changes- none    VISIT SUMMARY**  Arrived for home visit for Rhonda [name] who reports to be feeling okay other than tired. She reports her general fatigue. She denied any increased difficulty breathing, chest pain and dizziness. Baseline lower leg swelling. Lungs clear. I reviewed medications and confirmed same filling pill box for one week. We discussed medication compliance as she missed two evening doses. Vitals obtained and assessment as noted. We reviewed upcoming appointments and confirmed same. Reports she has eating and drinking "okay". She did not elaborate on her recent meals.  We discussed diet and med management. She agreed on same. Home visit complete. I will see [name] in one week.       BP 124/82   Pulse 82   Resp 16   Wt 213 lb (96.6 kg)   SpO2 99%   BMI 36.00 kg/m  Weight yesterday-did not weigh  Last visit weight-- 216lbs      ACTION: Home visit completed     Patient Care Team: Shon Hale, MD as PCP - General (Family Medicine) Burna Sis, LCSW as Social Worker (Licensed Clinical Social Worker) [name], Noberto Retort, DO as Consulting Physician (Neurology) [name], Paramedic as Paramedic Chanetta Marshall, Meridee Score, MD as Attending Physician (Family Medicine) Clinton Gallant, RN as [facility] Care Management Bensimhon, Bevelyn Buckles, MD as Consulting Physician  (Cardiology) Lolly Mustache, Phillips Grout, MD as Consulting Physician (Psychiatry) Candelaria Stagers, DPM as Consulting Physician (Podiatry)  Patient Active Problem List   Diagnosis Date Noted   Mild dementia 04/20/2023   Diabetic neuropathy    Elevated troponin 12/02/2021   Acute left ankle pain 10/11/2021   Hyponatremia 10/08/2021   Decreased estrogen level 08/01/2021   Hyperparathyroidism 08/01/2021   Spondylolisthesis 08/01/2021   Varicose veins of bilateral lower extremities with other complications 08/01/2021   Lumbago without sciatica 04/30/2021   Abnormal gait 06/11/2020   Allergic rhinitis 06/11/2020   Asthma 06/11/2020   Benign intracranial hypertension 06/11/2020   Body mass index (BMI) 45.0-49.9, adult 06/11/2020   Bowel incontinence 06/11/2020   Cholelithiasis 06/11/2020   Chronic sinusitis 06/11/2020   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   Daytime somnolence 06/11/2020   Edema 06/11/2020   Family history of malignant neoplasm of gastrointestinal tract 06/11/2020   Gout 06/11/2020   Insomnia 06/11/2020   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   Lumbar spondylosis with myelopathy 06/11/2020   Macrocytosis 06/11/2020   History of colonic polyps 06/11/2020   Repeated falls 06/11/2020   Irritable bowel syndrome 01/04/2020   Spinal stenosis of lumbar region 01/03/2020   Bilateral leg weakness 08/01/2019   Type II diabetes mellitus 06/15/2019   Hypokalemia 11/08/2018   CKD (chronic kidney disease) stage 3, GFR 30-59 ml/min 11/08/2018   Orthostatic hypotension 07/28/2017   On home oxygen therapy    Migraines    Left bundle  branch block    Hypothyroidism    Hypertension    Hyperlipidemia    Heart murmur    GERD (gastroesophageal reflux disease)    Exertional shortness of breath    Major depressive disorder    Back pain    Arthritis    Generalized anxiety disorder    Anemia    Chronic respiratory failure 09/14/2013   Biventricular ICD (implantable cardioverter-defibrillator) in place  08/04/2013   Chronic systolic heart failure 10/27/2012   Endometrial polyp 01/20/2012   Malignant tumor of breast 03/26/2011   Vitamin D deficiency 03/26/2011    Current Outpatient Medications:    acetaZOLAMIDE (DIAMOX) 125 MG tablet, Take 1 tablet (125 mg total) by mouth daily., Disp: 90 tablet, Rfl: 3   Ascorbic Acid (VITAMIN C) 1000 MG tablet, Take 1,000 mg by mouth daily., Disp: , Rfl:    azelastine (ASTELIN) 0.1 % nasal spray, Place 2 sprays into both nostrils 2 (two) times daily. Use in each nostril as directed, Disp: 30 mL, Rfl: 12   buPROPion (WELLBUTRIN XL) 150 MG 24 hr tablet, Take 1 tablet (150 mg total) by mouth daily., Disp: 30 tablet, Rfl: 2   carvedilol (COREG) 3.125 MG tablet, TAKE 1 TABLET (3.125 MG) BY MOUTH TWICE DAILY WITH MEALS, Disp: 180 tablet, Rfl: 3   citalopram (CELEXA) 20 MG tablet, Take 1 tablet (20 mg total) by mouth daily., Disp: 30 tablet, Rfl: 2   clonazePAM (KLONOPIN) 0.5 MG tablet, Take 1 tablet (0.5 mg total) by mouth 2 (two) times daily., Disp: 60 tablet, Rfl: 2   cyanocobalamin (VITAMIN B12) 1000 MCG tablet, Take 1,000 mcg by mouth daily., Disp: , Rfl:    desloratadine (CLARINEX) 5 MG tablet, Take 1 tablet (5 mg total) by mouth daily., Disp: 90 tablet, Rfl: 3   dicyclomine (BENTYL) 20 MG tablet, Take 20 mg by mouth 3 (three) times daily before meals., Disp: , Rfl:    fluticasone (FLONASE) 50 MCG/ACT nasal spray, Place 1 spray into both nostrils daily., Disp: , Rfl:    gabapentin (NEURONTIN) 100 MG capsule, TAKE 1 CAPSULE (100 MG TOTAL) BY MOUTH 2 (TWO) TIMES DAILY., Disp: 180 capsule, Rfl: 0   levothyroxine (SYNTHROID) 88 MCG tablet, TAKE 1 TABLET(88 MCG) BY MOUTH DAILY BEFORE BREAKFAST, Disp: 90 tablet, Rfl: 3   memantine (NAMENDA) 5 MG tablet, Take 1 tablet (5 mg total) by mouth daily., Disp: 30 tablet, Rfl: 3   metolazone (ZAROXOLYN) 2.5 MG tablet, Take 1 tablet by mouth once a week, Disp: 4 tablet, Rfl: 1   midodrine (PROAMATINE) 5 MG tablet, Take 1  tablet (5 mg total) by mouth 3 (three) times daily., Disp: 200 tablet, Rfl: 3   Multiple Vitamin (MULTIVITAMIN WITH MINERALS) TABS tablet, Take 1 tablet by mouth daily., Disp: , Rfl:    Ondansetron HCl (ZOFRAN PO), 4 mg. Take 1 tablet on the tongue and allow to dissolve, Disp: , Rfl:    potassium chloride SA (KLOR-CON M) 20 MEQ tablet, TAKE 2 TABLETS THREE TIMES DAILY. TAKE AN EXTRA 2 TABLETS ON DAYS YOU TAKE METOLAZONE, Disp: 572 tablet, Rfl: 3   risperiDONE (RISPERDAL) 0.5 MG tablet, Take 1 tablet (0.5 mg total) by mouth 2 (two) times daily at 8 am and 4 pm., Disp: 60 tablet, Rfl: 2   simvastatin (ZOCOR) 10 MG tablet, TAKE 1 TABLET EVERY EVENING, Disp: 90 tablet, Rfl: 3   spironolactone (ALDACTONE) 25 MG tablet, TAKE 1 TABLET (25 MG TOTAL) BY MOUTH EVERY EVENING., Disp: 90 tablet, Rfl: 3  torsemide (DEMADEX) 20 MG tablet, Take 5 tablets (100 mg total) by mouth 2 (two) times daily., Disp: 900 tablet, Rfl: 2 Allergies  Allergen Reactions   Ceftin Anaphylaxis    Face and throat swell    Cefuroxime Axetil Anaphylaxis    Face and throat swell   Geodon [Ziprasidone Hcl] Hives   Lisinopril Other (See Comments)    angioedema   Cefuroxime     Other reaction(s): anaphylaxis   Sulfacetamide Sodium-Sulfur     Other reaction(s): itch   Allopurinol Nausea Only and Other (See Comments)    weakness   Ativan [Lorazepam] Itching   Sulfa Antibiotics Itching   Valium [Diazepam] Other (See Comments)    Patient states that diazepam doesn't relax, it has the opposite effect.     Social History   Socioeconomic History   Marital status: Married    Spouse name: Not on file   Number of children: 2   Years of education: 22   Highest education level: Master's degree (e.g., MA, MS, MEng, MEd, MSW, MBA)  Occupational History   Occupation: retired  Tobacco Use   Smoking status: Some Days    Current packs/day: 0.00    Average packs/day: 0.1 packs/day for 26.0 years (2.6 ttl pk-yrs)    Types: Cigarettes     Start date: 03/24/1995    Last attempt to quit: 03/23/2021    Years since quitting: 2.1   Smokeless tobacco: Never  Vaping Use   Vaping status: Never Used  Substance and Sexual Activity   Alcohol use: No   Drug use: No   Sexual activity: Yes  Other Topics Concern   Not on file  Social History Narrative   Tobacco Use Cigarettes: Former Smoker, Quit in 2008   No Alcohol   No recreational drug use   Diet: Regular/Low Carb   Exercise: None   Occupation: disabled   Education: Company secretary, masters   Children: 2   Firearms: No   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      Social Drivers of Health   Financial Resource Strain: Not on file  Food Insecurity: No Food Insecurity (03/31/2023)   Hunger Vital Sign    Worried About Running Out of Food in the Last Year: Never true    Ran Out of Food in the Last Year: Never true  Transportation Needs: No Transportation Needs (03/31/2023)   PRAPARE - Administrator, Civil Service (Medical): No    Lack of Transportation (Non-Medical): No  Physical Activity: Not on file  Stress: No Stress Concern Present (08/11/2022)   [name] of Occupational Health - Occupational Stress Questionnaire    Feeling of Stress : Only a little  Social Connections: Not on file  Intimate Partner Violence: Not At Risk (07/27/2022)   Humiliation, Afraid, Rape, and Kick questionnaire    Fear of Current or Ex-Partner: No    Emotionally Abused: No    Physically Abused: No    Sexually Abused: No    Physical Exam      Future Appointments  Date Time Provider Department Center  05/27/2023 12:00 PM [facility] LAB [facility] None  06/02/2023  1:45 PM Freddie Breech, DPM [facility]  06/02/2023  2:00 PM Sheron Nightingale, RD [facility] NDM  06/03/2023 11:30 AM [name] D, LCSW [facility] None  06/07/2023  7:25 AM [facility] DEVICE REMOTES [facility]  06/10/2023  3:20 PM Arfeen, Kathi Ludwig  T, MD  [facility] None  06/14/2023  7:10 AM [facility] DEVICE REMOTES [facility]  06/29/2023  1:30 PM Nita Sickle K, DO [facility] None  06/30/2023  1:30 PM [facility] PHARMACIST [facility]  07/14/2023 11:30 AM [facility] PA/NP [facility] None  09/13/2023  7:10 AM [facility] DEVICE REMOTES [facility]

## 2023-05-27 ENCOUNTER — Other Ambulatory Visit (HOSPITAL_COMMUNITY): Payer: Medicare HMO

## 2023-05-31 ENCOUNTER — Telehealth (HOSPITAL_COMMUNITY): Payer: Self-pay

## 2023-05-31 ENCOUNTER — Other Ambulatory Visit (HOSPITAL_COMMUNITY): Payer: Self-pay

## 2023-05-31 NOTE — Progress Notes (Signed)
 Paramedicine Encounter    Patient ID: Rhonda [name], female    DOB: [birth year], 73 y.o.   MRN: [id number]   Complaints- shortness of breath, lower leg swelling, fatigue, cough, weight gain   Assessment- caox4, warm and dry, short of breath upon ambulating just a few steps, coughing, lungs rhonchus, lower leg edema, up 13 lbs in one week.   Compliance with meds- one missed noon dose   Pill box filled- for one week   Refills needed- risperidone , celexa , clonazepam , levothyroxine    Meds changes since last visit- today- add one metolazone  and of potassium due to fluid overload.     Social changes- poor diet- eating take out often     VISIT SUMMARY**  Arrived for home visit for [name] who reports to be feeling poorly today. She admits she has been more fatigue, short of breath and having some lower leg swelling over the last week. She has not been weighing daily and has not been adhering to a low sodium diet. Her weight is up 13 lbs in one week. I obtained assessment as noted and vitals. I contacted the HF clinic and they advised her to take an extra metolazone  for today and extra of potassium. Oluwatobi given same at 1600 today. I plan to call to follow up on Thursday and will see her in one week in the home. She knows to call EMS if symptoms persist or worsen. She is also aware of the importance of medication and diet adherence and I stressed how she HAS to abide by these to avoid hospitalization. She reports understanding. Home visit complete.   BP 122/68   Pulse 80   Resp (!) 22   Wt 226 lb (102.5 kg)   SpO2 99%   BMI 38.19 kg/m  Weight yesterday-- did not weigh  Last visit weight-- 213lbs      ACTION: Home visit completed     Patient Care Team: Chrystal Lamarr GORMAN, MD as PCP - General (Family Medicine) Cathern Andriette DEL, LCSW as Social Worker (Licensed Clinical Social Worker) Tobie, Tonita POUR, DO as Consulting Physician (Neurology) Jacques Moats, Paramedic  as Paramedic Chrystal, Lamarr GORMAN, MD as Attending Physician (Family Medicine) Ramonita Suzen CROME, RN as [facility] Care Management Bensimhon, Toribio SAUNDERS, MD as Consulting Physician (Cardiology) Curry, Leni DASEN, MD as Consulting Physician (Psychiatry) Tobie Franky SQUIBB, DPM as Consulting Physician (Podiatry)  Patient Active Problem List   Diagnosis Date Noted   Mild dementia 04/20/2023   Diabetic neuropathy    Elevated troponin 12/02/2021   Acute left ankle pain 10/11/2021   Hyponatremia 10/08/2021   Decreased estrogen level 08/01/2021   Hyperparathyroidism 08/01/2021   Spondylolisthesis 08/01/2021   Varicose veins of bilateral lower extremities with other complications 08/01/2021   Lumbago without sciatica 04/30/2021   Abnormal gait 06/11/2020   Allergic rhinitis 06/11/2020   Asthma 06/11/2020   Benign intracranial hypertension 06/11/2020   Body mass index (BMI) 45.0-49.9, adult 06/11/2020   Bowel incontinence 06/11/2020   Cholelithiasis 06/11/2020   Chronic sinusitis 06/11/2020   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   Daytime somnolence 06/11/2020   Edema 06/11/2020   Family history of malignant neoplasm of gastrointestinal tract 06/11/2020   Gout 06/11/2020   Insomnia 06/11/2020   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   Lumbar spondylosis with myelopathy 06/11/2020   Macrocytosis 06/11/2020   History of colonic polyps 06/11/2020   Repeated falls 06/11/2020   Irritable bowel syndrome 01/04/2020   Spinal stenosis of lumbar region 01/03/2020  Bilateral leg weakness 08/01/2019   Type II diabetes mellitus 06/15/2019   Hypokalemia 11/08/2018   CKD (chronic kidney disease) stage 3, GFR 30-59 ml/min 11/08/2018   Orthostatic hypotension 07/28/2017   On home oxygen  therapy    Migraines    Left bundle branch block    Hypothyroidism    Hypertension    Hyperlipidemia    Heart murmur    GERD (gastroesophageal reflux disease)    Exertional shortness of breath    Major depressive  disorder    Back pain    Arthritis    Generalized anxiety disorder    Anemia    Chronic respiratory failure 09/14/2013   Biventricular ICD (implantable cardioverter-defibrillator) in place 08/04/2013   Chronic systolic heart failure 10/27/2012   Endometrial polyp 01/20/2012   Malignant tumor of breast 03/26/2011   Vitamin D  deficiency 03/26/2011    Current Outpatient Medications:    acetaZOLAMIDE  (DIAMOX ) 125 MG tablet, Take 1 tablet (125 mg total) by mouth daily., Disp: 90 tablet, Rfl: 3   Ascorbic Acid  (VITAMIN C ) 1000 MG tablet, Take 1,000 mg by mouth daily., Disp: , Rfl:    azelastine  (ASTELIN ) 0.1 % nasal spray, Place 2 sprays into both nostrils 2 (two) times daily. Use in each nostril as directed, Disp: 30 mL, Rfl: 12   buPROPion  (WELLBUTRIN  XL) 150 MG 24 hr tablet, Take 1 tablet (150 mg total) by mouth daily., Disp: 30 tablet, Rfl: 2   carvedilol  (COREG ) 3.125 MG tablet, TAKE 1 TABLET (3.125 MG) BY MOUTH TWICE DAILY WITH MEALS, Disp: 180 tablet, Rfl: 3   citalopram  (CELEXA ) 20 MG tablet, Take 1 tablet (20 mg total) by mouth daily., Disp: 30 tablet, Rfl: 2   clonazePAM  (KLONOPIN ) 0.5 MG tablet, Take 1 tablet (0.5 mg total) by mouth 2 (two) times daily., Disp: 60 tablet, Rfl: 2   cyanocobalamin  (VITAMIN B12) 1000 MCG tablet, Take 1,000 mcg by mouth daily., Disp: , Rfl:    desloratadine  (CLARINEX ) 5 MG tablet, Take 1 tablet (5 mg total) by mouth daily., Disp: 90 tablet, Rfl: 3   dicyclomine  (BENTYL ) 20 MG tablet, Take 20 mg by mouth 3 (three) times daily before meals., Disp: , Rfl:    fluticasone  (FLONASE ) 50 MCG/ACT nasal spray, Place 1 spray into both nostrils daily., Disp: , Rfl:    gabapentin  (NEURONTIN ) 100 MG capsule, TAKE 1 CAPSULE (100 MG TOTAL) BY MOUTH 2 (TWO) TIMES DAILY., Disp: 180 capsule, Rfl: 0   levothyroxine  (SYNTHROID ) 88 MCG tablet, TAKE 1 TABLET(88 MCG) BY MOUTH DAILY BEFORE BREAKFAST, Disp: 90 tablet, Rfl: 3   memantine  (NAMENDA ) 5 MG tablet, Take 1 tablet (5 mg  total) by mouth daily., Disp: 30 tablet, Rfl: 3   metolazone  (ZAROXOLYN ) 2.5 MG tablet, Take 1 tablet by mouth once a week, Disp: 4 tablet, Rfl: 1   midodrine  (PROAMATINE ) 5 MG tablet, Take 1 tablet (5 mg total) by mouth 3 (three) times daily., Disp: 200 tablet, Rfl: 3   Multiple Vitamin (MULTIVITAMIN WITH MINERALS) TABS tablet, Take 1 tablet by mouth daily., Disp: , Rfl:    Ondansetron  HCl (ZOFRAN  PO), 4 mg. Take 1 tablet on the tongue and allow to dissolve, Disp: , Rfl:    potassium chloride  SA (KLOR-CON  M) 20 MEQ tablet, TAKE 2 TABLETS THREE TIMES DAILY. TAKE AN EXTRA 2 TABLETS ON DAYS YOU TAKE METOLAZONE , Disp: 572 tablet, Rfl: 3   risperiDONE  (RISPERDAL ) 0.5 MG tablet, Take 1 tablet (0.5 mg total) by mouth 2 (two) times daily at 8 am  and 4 pm., Disp: 60 tablet, Rfl: 2   simvastatin  (ZOCOR ) 10 MG tablet, TAKE 1 TABLET EVERY EVENING, Disp: 90 tablet, Rfl: 3   spironolactone  (ALDACTONE ) 25 MG tablet, TAKE 1 TABLET (25 MG TOTAL) BY MOUTH EVERY EVENING., Disp: 90 tablet, Rfl: 3   torsemide  (DEMADEX ) 20 MG tablet, Take 5 tablets (100 mg total) by mouth 2 (two) times daily., Disp: 900 tablet, Rfl: 2 Allergies  Allergen Reactions   Ceftin Anaphylaxis    Face and throat swell    Cefuroxime Axetil Anaphylaxis    Face and throat swell   Geodon [Ziprasidone Hcl] Hives   Lisinopril  Other (See Comments)    angioedema   Cefuroxime     Other reaction(s): anaphylaxis   Sulfacetamide Sodium-Sulfur      Other reaction(s): itch   Allopurinol  Nausea Only and Other (See Comments)    weakness   Ativan  [Lorazepam ] Itching   Sulfa Antibiotics Itching   Valium  [Diazepam ] Other (See Comments)    Patient states that diazepam  doesn't relax, it has the opposite effect.     Social History   Socioeconomic History   Marital status: Married    Spouse name: Not on file   Number of children: 2   Years of education: 34   Highest education level: Master's degree (e.g., MA, MS, MEng, MEd, MSW, MBA)   Occupational History   Occupation: retired  Tobacco Use   Smoking status: Some Days    Current packs/day: 0.00    Average packs/day: 0.1 packs/day for 26.0 years (2.6 ttl pk-yrs)    Types: Cigarettes    Start date: 03/24/1995    Last attempt to quit: 03/23/2021    Years since quitting: 2.1   Smokeless tobacco: Never  Vaping Use   Vaping status: Never Used  Substance and Sexual Activity   Alcohol use: No   Drug use: No   Sexual activity: Yes  Other Topics Concern   Not on file  Social History Narrative   Tobacco Use Cigarettes: Former Smoker, Quit in 2008   No Alcohol   No recreational drug use   Diet: Regular/Low Carb   Exercise: None   Occupation: disabled   Education: Company Secretary, masters   Children: 2   Firearms: No   Risk Analyst Use: Always   Former wellsite geologist.    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      Social Drivers of Health   Financial Resource Strain: Not on file  Food Insecurity: No Food Insecurity (03/31/2023)   Hunger Vital Sign    Worried About Running Out of Food in the Last Year: Never true    Ran Out of Food in the Last Year: Never true  Transportation Needs: No Transportation Needs (03/31/2023)   PRAPARE - Administrator, Civil Service (Medical): No    Lack of Transportation (Non-Medical): No  Physical Activity: Not on file  Stress: No Stress Concern Present (08/11/2022)   [name] of Occupational Health - Occupational Stress Questionnaire    Feeling of Stress : Only a little  Social Connections: Not on file  Intimate Partner Violence: Not At Risk (07/27/2022)   Humiliation, Afraid, Rape, and Kick questionnaire    Fear of Current or Ex-Partner: No    Emotionally Abused: No    Physically Abused: No    Sexually Abused: No    Physical Exam      Future Appointments  Date Time Provider Department Center  06/02/2023  1:45 PM Gaynel Delon CROME,  DPM [facility]  06/02/2023  2:00 PM Lovely Glenis LABOR, RD [facility] NDM   06/03/2023 11:30 AM Ezzard Remak D, LCSW [facility] None  06/07/2023  7:25 AM [facility] DEVICE REMOTES [facility]  06/10/2023  3:20 PM Arfeen, Leni DASEN, MD [facility] None  06/14/2023  7:10 AM [facility] DEVICE REMOTES [facility]  06/29/2023  1:30 PM Tobie Breslow K, DO [facility] None  06/30/2023  1:30 PM [facility] PHARMACIST [facility]  07/14/2023 11:30 AM [facility] PA/NP [facility] None  09/13/2023  7:10 AM [facility] DEVICE REMOTES [facility]

## 2023-05-31 NOTE — Telephone Encounter (Signed)
 Seen Rhonda [name] in the home today where she was noted to have a 13 lbs weight gain as well as increased shortness of breath and lower leg edema. Lung sounds revealed rhonchi with wheezing in all lobes. She admits to eating take out over the the last week but has has no missed doses of diuretics according to her husband who gives her her medication out of the pill box I fill weekly. Pillbox noted to only have one missed noon dose of medications including potassium and bentyl - but no other missed meds.   I contacted HF triage and left a VM for provider advice and will forward this to HF triage as well.   I will follow up.   [name], EMT-Paramedic [phone] 05/31/2023

## 2023-05-31 NOTE — Telephone Encounter (Signed)
 Pt aware via Rhonda [name] para med

## 2023-06-02 ENCOUNTER — Ambulatory Visit (INDEPENDENT_AMBULATORY_CARE_PROVIDER_SITE_OTHER): Payer: Medicare HMO | Admitting: Podiatry

## 2023-06-02 ENCOUNTER — Ambulatory Visit: Payer: Medicare HMO | Admitting: Dietician

## 2023-06-02 ENCOUNTER — Ambulatory Visit: Payer: Medicare HMO

## 2023-06-02 ENCOUNTER — Institutional Professional Consult (permissible substitution): Payer: Medicare HMO | Admitting: Psychology

## 2023-06-02 ENCOUNTER — Encounter: Payer: Self-pay | Admitting: Podiatry

## 2023-06-02 DIAGNOSIS — M79674 Pain in right toe(s): Secondary | ICD-10-CM | POA: Diagnosis not present

## 2023-06-02 DIAGNOSIS — E119 Type 2 diabetes mellitus without complications: Secondary | ICD-10-CM

## 2023-06-02 DIAGNOSIS — B351 Tinea unguium: Secondary | ICD-10-CM | POA: Diagnosis not present

## 2023-06-02 DIAGNOSIS — M79675 Pain in left toe(s): Secondary | ICD-10-CM | POA: Diagnosis not present

## 2023-06-02 DIAGNOSIS — M2141 Flat foot [pes planus] (acquired), right foot: Secondary | ICD-10-CM | POA: Diagnosis not present

## 2023-06-02 DIAGNOSIS — M2142 Flat foot [pes planus] (acquired), left foot: Secondary | ICD-10-CM | POA: Diagnosis not present

## 2023-06-02 DIAGNOSIS — E1142 Type 2 diabetes mellitus with diabetic polyneuropathy: Secondary | ICD-10-CM

## 2023-06-02 NOTE — Progress Notes (Signed)
 ANNUAL DIABETIC FOOT EXAM  Subjective: Rhonda [name] presents today for annual diabetic foot exam.  Chief Complaint  Patient presents with   [facility]    RM#15 DFC A1C  was in 12/24 not sure of value   Her husband is present during today's visit.  Patient confirms h/o diabetes.  Patient denies any h/o foot wounds.  Patient has been diagnosed with neuropathy.  Chrystal Lamarr GORMAN, MD is patient's PCP.  Past Medical History:  Diagnosis Date   Abnormal gait 06/11/2020   Acute left ankle pain 10/11/2021   AKI (acute kidney injury) 10/08/2021   Allergic rhinitis 06/11/2020   Anemia    Arthritis    Right knee   Asthma 06/11/2020   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   Back pain    Disk problem   Benign intracranial hypertension 06/11/2020   Bilateral leg weakness 08/01/2019   Biventricular ICD (implantable cardioverter-defibrillator) in place 08/04/2013   Body mass index (BMI) 45.0-49.9, adult 06/11/2020   Bowel incontinence 06/11/2020   Cardiomyopathy    Cholelithiasis 06/11/2020   Chronic respiratory failure 09/14/2013   Chronic sinusitis 06/11/2020   Chronic systolic heart failure 10/27/2012   a) NICM b) ECHO (03/2013) EF 20-25% c) ECHO (09/2013) EF 45-50%, grade I DD   CKD (chronic kidney disease) stage 3, GFR 30-59 ml/min 11/08/2018   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   Complication of anesthesia    History of low blood pressure after surgery; attributed to lying flat   Daytime somnolence 06/11/2020   Decreased estrogen level 08/01/2021   Diabetic neuropathy    Diarrhea of presumed infectious origin 06/11/2020   Edema 06/11/2020   Elevated troponin 12/02/2021   Endometrial polyp 01/20/2012   Exertional shortness of breath    Generalized anxiety disorder    GERD (gastroesophageal reflux disease)    Gout 06/11/2020   Heart murmur    History of colonic polyps 06/11/2020   History of fall 06/11/2020   Hyperlipidemia    Hyperparathyroidism 08/01/2021   Hypertension     Hypokalemia 11/08/2018   Hyponatremia 10/08/2021   Hypothyroidism    Insomnia 06/11/2020   Intractable nausea and vomiting 10/08/2021   Irritable bowel syndrome 01/04/2020   Left bundle branch block    s/p CRT-D (04/2013)   Lumbago without sciatica 04/30/2021   Lumbar spondylosis with myelopathy 06/11/2020   Macrocytosis 06/11/2020   Major depressive disorder    Malignant tumor of breast 03/26/2011   Left; completed chemotherapy and radiation treatments   Migraines    Mild dementia 04/20/2023   On home oxygen  therapy    2L suppose to be q night (05/03/2013)   Orthostatic hypotension 07/28/2017   Spinal stenosis of lumbar region 01/03/2020   Spondylolisthesis 08/01/2021   SVD (spontaneous vaginal delivery)    x 2   Type II diabetes mellitus 06/15/2019   Varicose veins of bilateral lower extremities with other complications 08/01/2021   Vitamin D  deficiency 03/26/2011   Patient Active Problem List   Diagnosis Date Noted   Mild dementia 04/20/2023   Diabetic neuropathy    Elevated troponin 12/02/2021   Acute left ankle pain 10/11/2021   Hyponatremia 10/08/2021   Decreased estrogen level 08/01/2021   Hyperparathyroidism 08/01/2021   Spondylolisthesis 08/01/2021   Varicose veins of bilateral lower extremities with other complications 08/01/2021   Lumbago without sciatica 04/30/2021   Abnormal gait 06/11/2020   Allergic rhinitis 06/11/2020   Asthma 06/11/2020   Benign intracranial hypertension 06/11/2020   Body mass index (BMI) 45.0-49.9, adult 06/11/2020  Bowel incontinence 06/11/2020   Cholelithiasis 06/11/2020   Chronic sinusitis 06/11/2020   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   Daytime somnolence 06/11/2020   Edema 06/11/2020   Family history of malignant neoplasm of gastrointestinal tract 06/11/2020   Gout 06/11/2020   Insomnia 06/11/2020   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   Lumbar spondylosis with myelopathy 06/11/2020   Macrocytosis 06/11/2020   History of colonic polyps  06/11/2020   Repeated falls 06/11/2020   Irritable bowel syndrome 01/04/2020   Spinal stenosis of lumbar region 01/03/2020   Bilateral leg weakness 08/01/2019   Type II diabetes mellitus 06/15/2019   Hypokalemia 11/08/2018   CKD (chronic kidney disease) stage 3, GFR 30-59 ml/min 11/08/2018   Orthostatic hypotension 07/28/2017   On home oxygen  therapy    Migraines    Left bundle branch block    Hypothyroidism    Hypertension    Hyperlipidemia    Heart murmur    GERD (gastroesophageal reflux disease)    Exertional shortness of breath    Major depressive disorder    Back pain    Arthritis    Generalized anxiety disorder    Anemia    Chronic respiratory failure 09/14/2013   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/04/2013   Chronic systolic heart failure 10/27/2012   Endometrial polyp 01/20/2012   Malignant tumor of breast 03/26/2011   Vitamin D  deficiency 03/26/2011   Past Surgical History:  Procedure Laterality Date   BI-VENTRICULAR IMPLANTABLE CARDIOVERTER DEFIBRILLATOR N/A 05/03/2013   Procedure: BI-VENTRICULAR IMPLANTABLE CARDIOVERTER DEFIBRILLATOR  (CRT-D);  Surgeon: Danelle LELON Birmingham, MD;  Location: [facility] CATH LAB;  Service: Cardiovascular;  Laterality: N/A;   BI-VENTRICULAR IMPLANTABLE CARDIOVERTER DEFIBRILLATOR  (CRT-D)  05/03/2013   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   BIOPSY  05/31/2018   Procedure: BIOPSY;  Surgeon: Saintclair Jasper, MD;  Location: [facility];  Service: Gastroenterology;;   BIV ICD GENERATOR CHANGEOUT N/A 04/11/2020   Procedure: BIV ICD GENERATOR CHANGEOUT;  Surgeon: Birmingham Danelle LELON, MD;  Location: [facility] INVASIVE CV LAB;  Service: Cardiovascular;  Laterality: N/A;   BREAST LUMPECTOMY Left 07/28/2010   CARDIAC CATHETERIZATION  2010   NORMAL CORONARY ARTERIES   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--11 SURGERIES     PT HAS REMOVABLE SPEECH BULB-TAKES IT OUT BEFORE HER SURGERY   COLONOSCOPY     COLONOSCOPY N/A  05/31/2018   Procedure: COLONOSCOPY;  Surgeon: Saintclair Jasper, MD;  Location: [facility];  Service: Gastroenterology;  Laterality: N/A;   HYSTEROSCOPY WITH D & C  01/20/2012   Procedure: DILATATION AND CURETTAGE /HYSTEROSCOPY;  Surgeon: Charlie CHRISTELLA Croak, MD;  Location: [facility];  Service: Gynecology;  Laterality: N/A;  with trueclear   PORT-A-CATH REMOVAL  09/23/2011   Procedure: REMOVAL PORT-A-CATH;  Surgeon: Donnice Bury, MD;  Location: [facility];  Service: General;  Laterality: N/A;  Port Removal   PORTACATH PLACEMENT  2012   TONSILLECTOMY  1960's   TOTAL KNEE ARTHROPLASTY  05/10/2012   Procedure: TOTAL KNEE ARTHROPLASTY;  Surgeon: Donnice JONETTA Car, MD;  Location: [facility];  Service: Orthopedics;  Laterality: Right;   WISDOM TOOTH EXTRACTION     Current Outpatient Medications on File Prior to Visit  Medication Sig Dispense Refill   acetaZOLAMIDE  (DIAMOX ) 125 MG tablet Take 1 tablet (125 mg total) by mouth daily. 90 tablet 3   Ascorbic Acid  (VITAMIN C ) 1000 MG tablet Take 1,000 mg by mouth daily.     azelastine  (ASTELIN ) 0.1 % nasal spray Place 2 sprays into both  nostrils 2 (two) times daily. Use in each nostril as directed 30 mL 12   buPROPion  (WELLBUTRIN  XL) 150 MG 24 hr tablet Take 1 tablet (150 mg total) by mouth daily. 30 tablet 2   carvedilol  (COREG ) 3.125 MG tablet TAKE 1 TABLET (3.125 MG) BY MOUTH TWICE DAILY WITH MEALS 180 tablet 3   citalopram  (CELEXA ) 20 MG tablet Take 1 tablet (20 mg total) by mouth daily. 30 tablet 2   clonazePAM  (KLONOPIN ) 0.5 MG tablet Take 1 tablet (0.5 mg total) by mouth 2 (two) times daily. 60 tablet 2   cyanocobalamin  (VITAMIN B12) 1000 MCG tablet Take 1,000 mcg by mouth daily.     desloratadine  (CLARINEX ) 5 MG tablet Take 1 tablet (5 mg total) by mouth daily. 90 tablet 3   dicyclomine  (BENTYL ) 20 MG tablet Take 20 mg by mouth 3 (three) times daily before meals.     fluticasone  (FLONASE ) 50 MCG/ACT nasal spray Place 1 spray into both nostrils daily.      gabapentin  (NEURONTIN ) 100 MG capsule TAKE 1 CAPSULE (100 MG TOTAL) BY MOUTH 2 (TWO) TIMES DAILY. 180 capsule 0   levothyroxine  (SYNTHROID ) 88 MCG tablet TAKE 1 TABLET(88 MCG) BY MOUTH DAILY BEFORE BREAKFAST 90 tablet 3   memantine  (NAMENDA ) 5 MG tablet Take 1 tablet (5 mg total) by mouth daily. 30 tablet 3   metolazone  (ZAROXOLYN ) 2.5 MG tablet Take 1 tablet by mouth once a week 4 tablet 1   midodrine  (PROAMATINE ) 5 MG tablet Take 1 tablet (5 mg total) by mouth 3 (three) times daily. 200 tablet 3   Multiple Vitamin (MULTIVITAMIN WITH MINERALS) TABS tablet Take 1 tablet by mouth daily.     Ondansetron  HCl (ZOFRAN  PO) 4 mg. Take 1 tablet on the tongue and allow to dissolve     potassium chloride  SA (KLOR-CON  M) 20 MEQ tablet TAKE 2 TABLETS THREE TIMES DAILY. TAKE AN EXTRA 2 TABLETS ON DAYS YOU TAKE METOLAZONE  572 tablet 3   risperiDONE  (RISPERDAL ) 0.5 MG tablet Take 1 tablet (0.5 mg total) by mouth 2 (two) times daily at 8 am and 4 pm. 60 tablet 2   simvastatin  (ZOCOR ) 10 MG tablet TAKE 1 TABLET EVERY EVENING 90 tablet 3   spironolactone  (ALDACTONE ) 25 MG tablet TAKE 1 TABLET (25 MG TOTAL) BY MOUTH EVERY EVENING. 90 tablet 3   torsemide  (DEMADEX ) 20 MG tablet Take 5 tablets (100 mg total) by mouth 2 (two) times daily. 900 tablet 2   No current facility-administered medications on file prior to visit.    Allergies  Allergen Reactions   Ceftin Anaphylaxis    Face and throat swell    Cefuroxime Axetil Anaphylaxis    Face and throat swell   Geodon [Ziprasidone Hcl] Hives   Lisinopril  Other (See Comments)    angioedema   Cefuroxime     Other reaction(s): anaphylaxis   Sulfacetamide Sodium-Sulfur      Other reaction(s): itch   Allopurinol  Nausea Only and Other (See Comments)    weakness   Ativan  [Lorazepam ] Itching   Sulfa Antibiotics Itching   Valium  [Diazepam ] Other (See Comments)    Patient states that diazepam  doesn't relax, it has the opposite effect.   Social History   Occupational  History   Occupation: retired  Tobacco Use   Smoking status: Some Days    Current packs/day: 0.00    Average packs/day: 0.1 packs/day for 26.0 years (2.6 ttl pk-yrs)    Types: Cigarettes    Start date: 03/24/1995  Last attempt to quit: 03/23/2021    Years since quitting: 2.2   Smokeless tobacco: Never  Vaping Use   Vaping status: Never Used  Substance and Sexual Activity   Alcohol use: No   Drug use: No   Sexual activity: Yes   Family History  Problem Relation Age of Onset   Alzheimer's disease Mother    CVA Mother    Hypertension Mother    Cancer - Colon Father        late 71   Birth defects Paternal Uncle    Immunization History  Administered Date(s) Administered   Influenza-Unspecified 02/22/2013   Pneumococcal-Unspecified 02/22/2013   Zoster, Live 02/22/2013     Review of Systems: Negative except as noted in the HPI.   Objective: There were no vitals filed for this visit.  Rhonda SCHLEICHER is a pleasant 73 y.o. female in NAD. AAO X 3.  Title   Diabetic Foot Exam - detailed Date & Time: 06/02/2023  1:45 PM Diabetic Foot exam was performed with the following findings: Yes  Visual Foot Exam completed.: Yes  Is there a history of foot ulcer?: No Is there a foot ulcer now?: No Is there swelling?: Yes Is there elevated skin temperature?: No Is there abnormal foot shape?: Yes (Comment: Pes planus b/l.) Is there a claw toe deformity?: No Are the toenails long?: Yes Are the toenails thick?: Yes Are the toenails ingrown?: No Is the skin thin, fragile, shiny and hairless?: Yes Normal Range of Motion?: No (Comment: Decreased ROM b/l ankle joints.) Is there foot or ankle muscle weakness?: No Do you have pain in calf while walking?: No Are the shoes appropriate in style and fit?: Yes Can the patient see the bottom of their feet?: No Pulse Foot Exam completed.: Yes   Right Posterior Tibialis: Diminished Left posterior Tibialis: Diminished   Right Dorsalis Pedis:  Diminished Left Dorsalis Pedis: Diminished     Sensory Foot Exam Completed.: Yes Semmes-Weinstein Monofilament Test + means has sensation and - means no sensation  R Foot Test Control: Neg L Foot Test Control: Neg   R Site 1-Great Toe: Neg L Site 1-Great Toe: Neg   R Site 4: Neg L Site 4: Neg   R site 5: Neg L Site 5: Neg  R Site 6: Neg L Site 6: Neg     Image components are not supported.   Image components are not supported. Image components are not supported.  T[place] Right vibratory: diminished Left vibratory: diminished  Comments     Lab Results  Component Value Date   HGBA1C 7.0 (H) 10/09/2021   ADA Risk Categorization: High Risk  Patient has one or more of the following: Loss of protective sensation Absent pedal pulses Severe Foot deformity History of foot ulcer  Assessment: 1. Pain due to onychomycosis of toenails of both feet   2. Pes planus of both feet   3. Diabetic peripheral neuropathy associated with type 2 diabetes mellitus (HCC)   4. Encounter for diabetic foot exam [facility])     Plan: Patient was evaluated and treated. All patient's and/or POA's questions/concerns addressed on today's visit. Toenails 1-5 debrided in length and girth without incident. Continue soft, supportive shoe gear daily. Report any pedal injuries to medical professional. Call office if there are any questions/concerns. -Diabetic foot examination performed today. -Continue diabetic foot care principles: inspect feet daily, monitor glucose as recommended by PCP and/or Endocrinologist, and follow prescribed diet per PCP, Endocrinologist and/or dietician. -Patient/POA to  call should there be question/concern in the interim. Return in about 3 months (around 08/31/2023).  Delon LITTIE Merlin, DPM      [place] LOCATION: [address] [place] [address]                   Office [phone]   [facility]  LOCATION: [address] [place], [place] [address] Office [phone]

## 2023-06-03 ENCOUNTER — Encounter: Payer: Self-pay | Admitting: Licensed Clinical Social Worker

## 2023-06-07 ENCOUNTER — Ambulatory Visit: Payer: Medicare HMO | Attending: Internal Medicine

## 2023-06-07 ENCOUNTER — Other Ambulatory Visit (HOSPITAL_COMMUNITY): Payer: Self-pay

## 2023-06-07 ENCOUNTER — Other Ambulatory Visit (HOSPITAL_COMMUNITY): Payer: Self-pay | Admitting: Psychiatry

## 2023-06-07 DIAGNOSIS — F330 Major depressive disorder, recurrent, mild: Secondary | ICD-10-CM

## 2023-06-07 DIAGNOSIS — R4189 Other symptoms and signs involving cognitive functions and awareness: Secondary | ICD-10-CM

## 2023-06-07 DIAGNOSIS — F411 Generalized anxiety disorder: Secondary | ICD-10-CM

## 2023-06-07 DIAGNOSIS — Z95810 Presence of automatic (implantable) cardiac defibrillator: Secondary | ICD-10-CM

## 2023-06-07 DIAGNOSIS — I5022 Chronic systolic (congestive) heart failure: Secondary | ICD-10-CM | POA: Diagnosis not present

## 2023-06-07 NOTE — Progress Notes (Signed)
 Paramedicine Encounter    Patient ID: Rhonda [name], female    DOB: 11-09-1950, 73 y.o.   MRN: [id number]   Complaints- no complaints today   Assessment- CAOX4, warm and dry, ambulatory with no shortness of breath, lower leg edema much improved, weight down 7lbs from last week, lungs clear, vitals at baseline.  Compliance with meds- missed one noon dose   Pill box filled- for one week   Refills needed-  -risperidone  -celexa  -clonazepam  -levothyroxine  -midodrine  -bentyl    Meds changes since last visit- extra metolazone  last week     Social changes- interested in [facility]    VISIT SUMMARY**  Arrived for Rhonda [name] who reports to be feeling much better this week. She is ambulating around with no shortness of breath noted and lower leg edema is much improved. Her weight is down 7lbs from last week- extra metolazone  seemed to help. She also reports she has been doing better with her diet with not eating soup or takeout over the weekend. I encouraged her to continue this. She verbalized understanding. I obtained vitals as noted. I reviewed meds and filled pill box for one week. Refills as noted called into [facility]. I also called [facility] to set up labs for this week. I wrote down and reminded her and husband ED about visits. Verity also expressed an interest in [facility]. I will gather info for our next visit.  Home visit complete.   BP 120/70   Pulse 85   Resp 16   Wt 219 lb (99.3 kg)   SpO2 99%   BMI 37.01 kg/m  Weight yesterday-- did not weigh  Last visit weight-- 226lbs       ACTION: Home visit completed     Patient Care Team: Chrystal Lamarr GORMAN, MD as PCP - General (Family Medicine) Cathern Andriette DEL, LCSW as Social Worker (Licensed Clinical Social Worker) Tobie, Tonita POUR, DO as Consulting Physician (Neurology) Jacques Moats, Paramedic as Paramedic Chrystal, Lamarr GORMAN, MD as Attending Physician (Family Medicine) Ramonita Suzen CROME, RN as [facility] Management Bensimhon, Toribio SAUNDERS, MD as Consulting Physician (Cardiology) Curry, Leni DASEN, MD as Consulting Physician (Psychiatry) Tobie Franky SQUIBB, DPM as Consulting Physician (Podiatry)  Patient Active Problem List   Diagnosis Date Noted   Mild dementia 04/20/2023   Diabetic neuropathy    Elevated troponin 12/02/2021   Acute left ankle pain 10/11/2021   Hyponatremia 10/08/2021   Decreased estrogen level 08/01/2021   Hyperparathyroidism 08/01/2021   Spondylolisthesis 08/01/2021   Varicose veins of bilateral lower extremities with other complications 08/01/2021   Lumbago without sciatica 04/30/2021   Abnormal gait 06/11/2020   Allergic rhinitis 06/11/2020   Asthma 06/11/2020   Benign intracranial hypertension 06/11/2020   Body mass index (BMI) 45.0-49.9, adult 06/11/2020   Bowel incontinence 06/11/2020   Cholelithiasis 06/11/2020   Chronic sinusitis 06/11/2020   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   Daytime somnolence 06/11/2020   Edema 06/11/2020   Family history of malignant neoplasm of gastrointestinal tract 06/11/2020   Gout 06/11/2020   Insomnia 06/11/2020   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   Lumbar spondylosis with myelopathy 06/11/2020   Macrocytosis 06/11/2020   History of colonic polyps 06/11/2020   Repeated falls 06/11/2020   Irritable bowel syndrome 01/04/2020   Spinal stenosis of lumbar region 01/03/2020   Bilateral leg weakness 08/01/2019   Type II diabetes mellitus 06/15/2019   Hypokalemia 11/08/2018   CKD (chronic kidney disease) stage 3, GFR 30-59 ml/min 11/08/2018   Orthostatic hypotension  07/28/2017   On home oxygen  therapy    Migraines    Left bundle branch block    Hypothyroidism    Hypertension    Hyperlipidemia    Heart murmur    GERD (gastroesophageal reflux disease)    Exertional shortness of breath    Major depressive disorder    Back pain    Arthritis    Generalized anxiety disorder    Anemia    Chronic respiratory failure  09/14/2013   Biventricular ICD (implantable cardioverter-defibrillator) in place 08/04/2013   Chronic systolic heart failure 10/27/2012   Endometrial polyp 01/20/2012   Malignant tumor of breast 03/26/2011   Vitamin D  deficiency 03/26/2011    Current Outpatient Medications:    acetaZOLAMIDE  (DIAMOX ) 125 MG tablet, Take 1 tablet (125 mg total) by mouth daily., Disp: 90 tablet, Rfl: 3   Ascorbic Acid  (VITAMIN C ) 1000 MG tablet, Take 1,000 mg by mouth daily., Disp: , Rfl:    azelastine  (ASTELIN ) 0.1 % nasal spray, Place 2 sprays into both nostrils 2 (two) times daily. Use in each nostril as directed, Disp: 30 mL, Rfl: 12   buPROPion  (WELLBUTRIN  XL) 150 MG 24 hr tablet, Take 1 tablet (150 mg total) by mouth daily., Disp: 30 tablet, Rfl: 2   carvedilol  (COREG ) 3.125 MG tablet, TAKE 1 TABLET (3.125 MG) BY MOUTH TWICE DAILY WITH MEALS, Disp: 180 tablet, Rfl: 3   citalopram  (CELEXA ) 20 MG tablet, Take 1 tablet (20 mg total) by mouth daily., Disp: 30 tablet, Rfl: 2   clonazePAM  (KLONOPIN ) 0.5 MG tablet, Take 1 tablet (0.5 mg total) by mouth 2 (two) times daily., Disp: 60 tablet, Rfl: 2   cyanocobalamin  (VITAMIN B12) 1000 MCG tablet, Take 1,000 mcg by mouth daily., Disp: , Rfl:    desloratadine  (CLARINEX ) 5 MG tablet, Take 1 tablet (5 mg total) by mouth daily., Disp: 90 tablet, Rfl: 3   dicyclomine  (BENTYL ) 20 MG tablet, Take 20 mg by mouth 3 (three) times daily before meals., Disp: , Rfl:    fluticasone  (FLONASE ) 50 MCG/ACT nasal spray, Place 1 spray into both nostrils daily., Disp: , Rfl:    gabapentin  (NEURONTIN ) 100 MG capsule, TAKE 1 CAPSULE (100 MG TOTAL) BY MOUTH 2 (TWO) TIMES DAILY., Disp: 180 capsule, Rfl: 0   memantine  (NAMENDA ) 5 MG tablet, Take 1 tablet (5 mg total) by mouth daily., Disp: 30 tablet, Rfl: 3   metolazone  (ZAROXOLYN ) 2.5 MG tablet, Take 1 tablet by mouth once a week, Disp: 4 tablet, Rfl: 1   midodrine  (PROAMATINE ) 5 MG tablet, Take 1 tablet (5 mg total) by mouth 3 (three) times  daily., Disp: 200 tablet, Rfl: 3   Ondansetron  HCl (ZOFRAN  PO), 4 mg. Take 1 tablet on the tongue and allow to dissolve, Disp: , Rfl:    potassium chloride  SA (KLOR-CON  M) 20 MEQ tablet, TAKE 2 TABLETS THREE TIMES DAILY. TAKE AN EXTRA 2 TABLETS ON DAYS YOU TAKE METOLAZONE , Disp: 572 tablet, Rfl: 3   risperiDONE  (RISPERDAL ) 0.5 MG tablet, Take 1 tablet (0.5 mg total) by mouth 2 (two) times daily at 8 am and 4 pm., Disp: 60 tablet, Rfl: 2   simvastatin  (ZOCOR ) 10 MG tablet, TAKE 1 TABLET EVERY EVENING, Disp: 90 tablet, Rfl: 3   spironolactone  (ALDACTONE ) 25 MG tablet, TAKE 1 TABLET (25 MG TOTAL) BY MOUTH EVERY EVENING., Disp: 90 tablet, Rfl: 3   torsemide  (DEMADEX ) 20 MG tablet, Take 5 tablets (100 mg total) by mouth 2 (two) times daily., Disp: 900 tablet, Rfl: 2  levothyroxine  (SYNTHROID ) 88 MCG tablet, TAKE 1 TABLET(88 MCG) BY MOUTH DAILY BEFORE BREAKFAST, Disp: 90 tablet, Rfl: 3   Multiple Vitamin (MULTIVITAMIN WITH MINERALS) TABS tablet, Take 1 tablet by mouth daily., Disp: , Rfl:  Allergies  Allergen Reactions   Ceftin Anaphylaxis    Face and throat swell    Cefuroxime Axetil Anaphylaxis    Face and throat swell   Geodon [Ziprasidone Hcl] Hives   Lisinopril  Other (See Comments)    angioedema   Cefuroxime     Other reaction(s): anaphylaxis   Sulfacetamide Sodium-Sulfur      Other reaction(s): itch   Allopurinol  Nausea Only and Other (See Comments)    weakness   Ativan  [Lorazepam ] Itching   Sulfa Antibiotics Itching   Valium  [Diazepam ] Other (See Comments)    Patient states that diazepam  doesn't relax, it has the opposite effect.     Social History   Socioeconomic History   Marital status: Married    Spouse name: Not on file   Number of children: 2   Years of education: 30   Highest education level: Master's degree (e.g., MA, MS, MEng, MEd, MSW, MBA)  Occupational History   Occupation: retired  Tobacco Use   Smoking status: Some Days    Current packs/day: 0.00    Average  packs/day: 0.1 packs/day for 26.0 years (2.6 ttl pk-yrs)    Types: Cigarettes    Start date: 03/24/1995    Last attempt to quit: 03/23/2021    Years since quitting: 2.2   Smokeless tobacco: Never  Vaping Use   Vaping status: Never Used  Substance and Sexual Activity   Alcohol use: No   Drug use: No   Sexual activity: Yes  Other Topics Concern   Not on file  Social History Narrative   Tobacco Use Cigarettes: Former Smoker, Quit in 2008   No Alcohol   No recreational drug use   Diet: Regular/Low Carb   Exercise: None   Occupation: disabled   Education: Company Secretary, masters   Children: 2   Firearms: No   Risk Analyst Use: Always   Former wellsite geologist.    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      Social Drivers of Health   Financial Resource Strain: Not on file  Food Insecurity: No Food Insecurity (03/31/2023)   Hunger Vital Sign    Worried About Running Out of Food in the Last Year: Never true    Ran Out of Food in the Last Year: Never true  Transportation Needs: No Transportation Needs (03/31/2023)   PRAPARE - Administrator, Civil Service (Medical): No    Lack of Transportation (Non-Medical): No  Physical Activity: Not on file  Stress: No Stress Concern Present (08/11/2022)   [name] of Occupational Health - Occupational Stress Questionnaire    Feeling of Stress : Only a little  Social Connections: Not on file  Intimate Partner Violence: Not At Risk (07/27/2022)   Humiliation, Afraid, Rape, and Kick questionnaire    Fear of Current or Ex-Partner: No    Emotionally Abused: No    Physically Abused: No    Sexually Abused: No    Physical Exam      Future Appointments  Date Time Provider Department Center  06/09/2023  2:15 PM [facility] LAB [facility] None  06/10/2023  3:20 PM Arfeen, Leni DASEN, MD [facility] None  06/14/2023  7:10 AM [facility] DEVICE REMOTES [facility]  06/29/2023  1:30 PM Patel, Donika K, DO [facility] None  06/30/2023  1:30 PM  [facility] PHARMACIST [facility]  07/14/2023 11:30 AM [facility] PA/NP [facility] None  09/13/2023  7:10 AM [facility] DEVICE REMOTES [facility]  09/22/2023  1:45 PM [place], Delon CROME, DPM [facility]

## 2023-06-08 NOTE — Patient Instructions (Signed)
 Visit Information  Thank you for taking time to visit with me today. Please don't hesitate to contact me if I can be of assistance to you before our next scheduled telephone appointment.  Following are the goals we discussed today:  (Copy and paste patient goals from clinical care plan here)  Our next appointment is by telephone on 2/27 at 3:30 PM  Please call the care guide team at [phone] if you need to cancel or reschedule your appointment.   If you are experiencing a Mental Health or Behavioral Health Crisis or need someone to talk to, please call the Suicide and Crisis Lifeline: 988 call 911   The patient verbalized understanding of instructions, educational materials, and care plan provided today and DECLINED offer to receive copy of patient instructions, educational materials, and care plan.   Taiwan Talcott, MSW, LCSW [place]  [facility] [email or web address] Phone [phone] 5:23 PM

## 2023-06-08 NOTE — Patient Outreach (Signed)
  Care Coordination   Follow Up Visit Note   06/03/2023 Name: Rhonda [name] MRN: [id number] DOB: 1951/02/03  Rhonda [name] Rhonda [name] is a 73 y.o. year old female who sees Chrystal, Lamarr GORMAN, MD for primary care. I spoke with  Rhonda [name] Hibbard's spouse by phone today.  What matters to the patients health and wellness today?  Supportive Resources    Goals Addressed             This Visit's Progress    Obtain Supportive Resources   On track    Activities and task to complete in order to accomplish goals.   Keep all upcoming appointments discussed today Continue with compliance of taking medication prescribed by Doctor Implement healthy coping skills discussed to assist with management of symptoms Provide required ppwk to [employer]; who will continue to assist family with applying for Medicaid Review the supportive information provided via mail         SDOH assessments and interventions completed:  No     Care Coordination Interventions:  Yes, provided  Interventions Today    Flowsheet Row Most Recent Value  Chronic Disease   Chronic disease during today's visit Hypertension (HTN), Other  [Demenita, MDD, GAD]  General Interventions   General Interventions Discussed/Reviewed General Interventions Reviewed, Doctor Visits  Doctor Visits Discussed/Reviewed Doctor Visits Reviewed  Mental Health Interventions   Mental Health Discussed/Reviewed Mental Health Reviewed       Follow up plan: Follow up call scheduled for 4-6 weeks    Encounter Outcome:  Patient Visit Completed   Rolin Kerns, MSW, LCSW [facility] Care Management [facility]  [facility] [email or web address] Phone [phone] 5:22 PM

## 2023-06-09 ENCOUNTER — Encounter: Payer: Medicare HMO | Admitting: Psychology

## 2023-06-09 ENCOUNTER — Ambulatory Visit (HOSPITAL_COMMUNITY)
Admission: RE | Admit: 2023-06-09 | Discharge: 2023-06-09 | Disposition: A | Discharge status: Home / Self Care | Payer: Medicare HMO | Source: Ambulatory Visit | Attending: Cardiology | Admitting: Cardiology

## 2023-06-09 DIAGNOSIS — I5022 Chronic systolic (congestive) heart failure: Secondary | ICD-10-CM | POA: Insufficient documentation

## 2023-06-09 LAB — BASIC METABOLIC PANEL
Anion gap: 13 (ref 5–15)
BUN: 42 mg/dL — ABNORMAL HIGH (ref 8–23)
CO2: 28 mmol/L (ref 22–32)
Calcium: 9.1 mg/dL (ref 8.9–10.3)
Chloride: 95 mmol/L — ABNORMAL LOW (ref 98–111)
Creatinine, Ser: 1.87 mg/dL — ABNORMAL HIGH (ref 0.44–1.00)
GFR, Estimated: 29 mL/min — ABNORMAL LOW (ref >60)
Glucose, Bld: 109 mg/dL — ABNORMAL HIGH (ref 70–99)
Potassium: 3.5 mmol/L (ref 3.5–5.1)
Sodium: 136 mmol/L (ref 135–145)

## 2023-06-10 ENCOUNTER — Encounter (HOSPITAL_COMMUNITY): Payer: Self-pay | Admitting: Psychiatry

## 2023-06-10 ENCOUNTER — Other Ambulatory Visit: Payer: Self-pay

## 2023-06-10 ENCOUNTER — Ambulatory Visit (HOSPITAL_COMMUNITY): Payer: Medicare HMO | Admitting: Psychiatry

## 2023-06-10 VITALS — BP 126/70 | HR 75 | Ht 60.0 in | Wt 220.0 lb

## 2023-06-10 DIAGNOSIS — R4189 Other symptoms and signs involving cognitive functions and awareness: Secondary | ICD-10-CM

## 2023-06-10 DIAGNOSIS — F411 Generalized anxiety disorder: Secondary | ICD-10-CM | POA: Diagnosis not present

## 2023-06-10 DIAGNOSIS — F330 Major depressive disorder, recurrent, mild: Secondary | ICD-10-CM | POA: Diagnosis not present

## 2023-06-10 MED ORDER — CITALOPRAM HYDROBROMIDE 20 MG PO TABS: 20.0000 mg | ORAL_TABLET | Freq: Every day | ORAL | 2 refills | Status: DC

## 2023-06-10 MED ORDER — CLONAZEPAM 0.5 MG PO TABS: 0.5000 mg | ORAL_TABLET | Freq: Two times a day (BID) | ORAL | 2 refills | Status: DC

## 2023-06-10 MED ORDER — BUPROPION HCL ER (XL) 150 MG PO TB24: 150.0000 mg | ORAL_TABLET | Freq: Every day | ORAL | 2 refills | Status: DC

## 2023-06-10 MED ORDER — RISPERIDONE 0.5 MG PO TABS: 0.5000 mg | ORAL_TABLET | ORAL | 2 refills | Status: DC

## 2023-06-10 NOTE — Progress Notes (Signed)
BH MD/PA/NP OP Progress Note  Patient location; office Provider location; office  06/10/2023 3:31 PM Rhonda [name]  MRN:  [id number]  Chief Complaint:  Chief Complaint  Patient presents with   Follow-up   HPI: Patient came today for her follow-up appointment.  Her husband was also present.  Patient recently saw a neurologist testing which shows dementia.  Husband reported further decline in the memory.  She does not leave her house without her husband.  She tried to read the books but could not past her first chapter.  He reported when she forgets then she gets more irritable, sad, depressed and having crying spells.  She needed assurance.  Patient tends to forget the conversation and keep getting reassurance from her husband about the answers.  She is able to do bathing, change her clothes, cleaning the house but not able to drive, cooking.  Her medicines organized by home health aide on the office of cardiologist.  Husband tried to work with the pace program and hoping in the future it may work for the patient.  The patient denies any aggression, violence, suicidal thoughts.  She does not know the name of the medication but she feels it is helping her overall depression and mood.  Husband reported patient is not aggressive, violent or agitated.  Recently had a blood work.  Labs stable though high creatinine and BUN.  Patient has multiple health issues including CHF, neuropathy, headaches, hypertension.  She does use a cane for walking while at home she feels does not need it.  She admitted boredom now trying to read the books slowly and gradually.  She also watched television when she is by herself.  Her husband works.  Patient denies drinking or using any illegal substances.  She reported holidays were good and quite.  Visit Diagnosis:    ICD-10-CM   1. Major depressive disorder, recurrent episode, mild (HCC)  F33.0 buPROPion (WELLBUTRIN XL) 150 MG 24 hr tablet    citalopram (CELEXA) 20 MG  tablet    clonazePAM (KLONOPIN) 0.5 MG tablet    risperiDONE (RISPERDAL) 0.5 MG tablet    2. Cognitive decline  R41.89 buPROPion (WELLBUTRIN XL) 150 MG 24 hr tablet    3. Generalized anxiety disorder  F41.1 citalopram (CELEXA) 20 MG tablet    clonazePAM (KLONOPIN) 0.5 MG tablet        Past Psychiatric History: Reviewed. History of depression, anxiety and seen by Dr. Clyde Lundborg for a few years but do not remember the details. History of inpatient in March 2024 due to severe psychotic and aggressive behavior. Patient was discharged on trazodone, Seroquel, risperidone, Celexa, Wellbutrin, Klonopin and Wellbutrin. She was also given olanzapine, Haldol for agitation. No history of suicidal attempt.   Past Medical History:  Past Medical History:  Diagnosis Date   Abnormal gait 06/11/2020   Acute left ankle pain 10/11/2021   AKI (acute kidney injury) 10/08/2021   Allergic rhinitis 06/11/2020   Anemia    Arthritis    Right knee   Asthma 06/11/2020   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   Back pain    Disk problem   Benign intracranial hypertension 06/11/2020   Bilateral leg weakness 08/01/2019   Biventricular ICD (implantable cardioverter-defibrillator) in place 08/04/2013   Body mass index (BMI) 45.0-49.9, adult 06/11/2020   Bowel incontinence 06/11/2020   Cardiomyopathy    Cholelithiasis 06/11/2020   Chronic respiratory failure 09/14/2013   Chronic sinusitis 06/11/2020   Chronic systolic heart failure 10/27/2012   a) NICM b)  ECHO (03/2013) EF 20-25% c) ECHO (09/2013) EF 45-50%, grade I DD   CKD (chronic kidney disease) stage 3, GFR 30-59 ml/min 11/08/2018   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   Complication of anesthesia    History of low blood pressure after surgery; attributed to lying flat   Daytime somnolence 06/11/2020   Decreased estrogen level 08/01/2021   Diabetic neuropathy    Diarrhea of presumed infectious origin 06/11/2020   Edema 06/11/2020   Elevated troponin 12/02/2021    Endometrial polyp 01/20/2012   Exertional shortness of breath    Generalized anxiety disorder    GERD (gastroesophageal reflux disease)    Gout 06/11/2020   Heart murmur    History of colonic polyps 06/11/2020   History of fall 06/11/2020   Hyperlipidemia    Hyperparathyroidism 08/01/2021   Hypertension    Hypokalemia 11/08/2018   Hyponatremia 10/08/2021   Hypothyroidism    Insomnia 06/11/2020   Intractable nausea and vomiting 10/08/2021   Irritable bowel syndrome 01/04/2020   Left bundle branch block    s/p CRT-D (04/2013)   Lumbago without sciatica 04/30/2021   Lumbar spondylosis with myelopathy 06/11/2020   Macrocytosis 06/11/2020   Major depressive disorder    Malignant tumor of breast 03/26/2011   Left; completed chemotherapy and radiation treatments   Migraines    Mild dementia 04/20/2023   On home oxygen therapy    "2L suppose to be q night" (05/03/2013)   Orthostatic hypotension 07/28/2017   Spinal stenosis of lumbar region 01/03/2020   Spondylolisthesis 08/01/2021   SVD (spontaneous vaginal delivery)    x 2   Type II diabetes mellitus 06/15/2019   Varicose veins of bilateral lower extremities with other complications 08/01/2021   Vitamin D deficiency 03/26/2011    Past Surgical History:  Procedure Laterality Date   BI-VENTRICULAR IMPLANTABLE CARDIOVERTER DEFIBRILLATOR N/A 05/03/2013   Procedure: BI-VENTRICULAR IMPLANTABLE CARDIOVERTER DEFIBRILLATOR  (CRT-D);  Surgeon: Marinus Maw, MD;  Location: [facility] CATH LAB;  Service: Cardiovascular;  Laterality: N/A;   BI-VENTRICULAR IMPLANTABLE CARDIOVERTER DEFIBRILLATOR  (CRT-D)  05/03/2013   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   BIOPSY  05/31/2018   Procedure: BIOPSY;  Surgeon: Kerin Salen, MD;  Location: [facility] ENDOSCOPY;  Service: Gastroenterology;;   BIV ICD GENERATOR CHANGEOUT N/A 04/11/2020   Procedure: BIV ICD GENERATOR CHANGEOUT;  Surgeon: Marinus Maw, MD;  Location: [facility] INVASIVE CV LAB;   Service: Cardiovascular;  Laterality: N/A;   BREAST LUMPECTOMY Left 07/28/2010   CARDIAC CATHETERIZATION  2010   NORMAL CORONARY ARTERIES   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--11 SURGERIES     PT HAS REMOVABLE SPEECH BULB-TAKES IT OUT BEFORE HER SURGERY   COLONOSCOPY     COLONOSCOPY N/A 05/31/2018   Procedure: COLONOSCOPY;  Surgeon: Kerin Salen, MD;  Location: [facility] ENDOSCOPY;  Service: Gastroenterology;  Laterality: N/A;   HYSTEROSCOPY WITH D & C  01/20/2012   Procedure: DILATATION AND CURETTAGE /HYSTEROSCOPY;  Surgeon: Meriel Pica, MD;  Location: [facility] ORS;  Service: Gynecology;  Laterality: N/A;  with trueclear   PORT-A-CATH REMOVAL  09/23/2011   Procedure: REMOVAL PORT-A-CATH;  Surgeon: Emelia Loron, MD;  Location: [facility] ORS;  Service: General;  Laterality: N/A;  Port Removal   PORTACATH PLACEMENT  2012   TONSILLECTOMY  1960's   TOTAL KNEE ARTHROPLASTY  05/10/2012   Procedure: TOTAL KNEE ARTHROPLASTY;  Surgeon: Shelda Pal, MD;  Location: [facility] ORS;  Service: Orthopedics;  Laterality: Right;   WISDOM TOOTH EXTRACTION  Family Psychiatric History: Reviewed.  Family History:  Family History  Problem Relation Age of Onset   Alzheimer's disease Mother    CVA Mother    Hypertension Mother    Cancer - Colon Father        late 57   Birth defects Paternal Uncle     Social History:  Social History   Socioeconomic History   Marital status: Married    Spouse name: Not on file   Number of children: 2   Years of education: 18   Highest education level: Manufacturing engineer (e.g., MA, MS, MEng, MEd, MSW, MBA)  Occupational History   Occupation: retired  Tobacco Use   Smoking status: Some Days    Current packs/day: 0.00    Average packs/day: 0.1 packs/day for 26.0 years (2.6 ttl pk-yrs)    Types: Cigarettes    Start date: 03/24/1995    Last attempt to quit: 03/23/2021    Years since quitting: 2.2   Smokeless tobacco: Never  Vaping Use   Vaping status: Never Used  Substance and Sexual  Activity   Alcohol use: No   Drug use: No   Sexual activity: Yes  Other Topics Concern   Not on file  Social History Narrative   Tobacco Use Cigarettes: Former Smoker, Quit in 2008   No Alcohol   No recreational drug use   Diet: Regular/Low Carb   Exercise: None   Occupation: disabled   Education: Company secretary, masters   Children: 2   Firearms: No   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      Social Drivers of Health   Financial Resource Strain: Not on file  Food Insecurity: No Food Insecurity (03/31/2023)   Hunger Vital Sign    Worried About Running Out of Food in the Last Year: Never true    Ran Out of Food in the Last Year: Never true  Transportation Needs: No Transportation Needs (03/31/2023)   PRAPARE - Administrator, Civil Service (Medical): No    Lack of Transportation (Non-Medical): No  Physical Activity: Not on file  Stress: No Stress Concern Present (08/11/2022)   [name] of Occupational Health - Occupational Stress Questionnaire    Feeling of Stress : Only a little  Social Connections: Not on file    Allergies:  Allergies  Allergen Reactions   Ceftin Anaphylaxis    Face and throat swell    Cefuroxime Axetil Anaphylaxis    Face and throat swell   Geodon [Ziprasidone Hcl] Hives   Lisinopril Other (See Comments)    angioedema   Cefuroxime     Other reaction(s): anaphylaxis   Sulfacetamide Sodium-Sulfur     Other reaction(s): itch   Allopurinol Nausea Only and Other (See Comments)    weakness   Ativan [Lorazepam] Itching   Sulfa Antibiotics Itching   Valium [Diazepam] Other (See Comments)    Patient states that diazepam doesn't relax, it has the opposite effect.    Metabolic Disorder Labs: Lab Results  Component Value Date   HGBA1C 7.0 (H) 10/09/2021   MPG 154.2 10/09/2021   MPG 151.33 10/08/2021   No results found for: "PROLACTIN" Lab Results  Component Value Date   CHOL 133  07/31/2022   TRIG 67 07/31/2022   HDL 51 07/31/2022   CHOLHDL 2.6 07/31/2022   VLDL 13 07/31/2022   LDLCALC 69 07/31/2022   LDLCALC  08/05/2009  92        Total Cholesterol/HDL:CHD Risk Coronary Heart Disease Risk Table                     Men   Women  1/2 Average Risk   3.4   3.3  Average Risk       5.0   4.4  2 X Average Risk   9.6   7.1  3 X Average Risk  23.4   11.0        Use the calculated Patient Ratio above and the CHD Risk Table to determine the patient's CHD Risk.        ATP III CLASSIFICATION (LDL):  <100     mg/dL   Optimal  161-096  mg/dL   Near or Above                    Optimal  130-159  mg/dL   Borderline  045-409  mg/dL   High  >811     mg/dL   Very High   Lab Results  Component Value Date   TSH 2.398 07/26/2022   TSH 1.127 10/08/2021    Therapeutic Level Labs: No results found for: "LITHIUM" No results found for: "VALPROATE" No results found for: "CBMZ"  Current Medications: Current Outpatient Medications  Medication Sig Dispense Refill   acetaZOLAMIDE (DIAMOX) 125 MG tablet Take 1 tablet (125 mg total) by mouth daily. 90 tablet 3   Ascorbic Acid (VITAMIN C) 1000 MG tablet Take 1,000 mg by mouth daily.     azelastine (ASTELIN) 0.1 % nasal spray Place 2 sprays into both nostrils 2 (two) times daily. Use in each nostril as directed 30 mL 12   buPROPion (WELLBUTRIN XL) 150 MG 24 hr tablet Take 1 tablet (150 mg total) by mouth daily. 30 tablet 2   carvedilol (COREG) 3.125 MG tablet TAKE 1 TABLET (3.125 MG) BY MOUTH TWICE DAILY WITH MEALS 180 tablet 3   citalopram (CELEXA) 20 MG tablet Take 1 tablet (20 mg total) by mouth daily. 30 tablet 2   clonazePAM (KLONOPIN) 0.5 MG tablet Take 1 tablet (0.5 mg total) by mouth 2 (two) times daily. 60 tablet 2   cyanocobalamin (VITAMIN B12) 1000 MCG tablet Take 1,000 mcg by mouth daily.     desloratadine (CLARINEX) 5 MG tablet Take 1 tablet (5 mg total) by mouth daily. 90 tablet 3   dicyclomine (BENTYL) 20 MG  tablet Take 20 mg by mouth 3 (three) times daily before meals.     fluticasone (FLONASE) 50 MCG/ACT nasal spray Place 1 spray into both nostrils daily.     gabapentin (NEURONTIN) 100 MG capsule TAKE 1 CAPSULE (100 MG TOTAL) BY MOUTH 2 (TWO) TIMES DAILY. 180 capsule 0   levothyroxine (SYNTHROID) 88 MCG tablet TAKE 1 TABLET(88 MCG) BY MOUTH DAILY BEFORE BREAKFAST 90 tablet 3   memantine (NAMENDA) 5 MG tablet Take 1 tablet (5 mg total) by mouth daily. 30 tablet 3   metolazone (ZAROXOLYN) 2.5 MG tablet Take 1 tablet by mouth once a week 4 tablet 1   midodrine (PROAMATINE) 5 MG tablet Take 1 tablet (5 mg total) by mouth 3 (three) times daily. 200 tablet 3   Multiple Vitamin (MULTIVITAMIN WITH MINERALS) TABS tablet Take 1 tablet by mouth daily.     Ondansetron HCl (ZOFRAN PO) 4 mg. Take 1 tablet on the tongue and allow to dissolve     potassium chloride SA (KLOR-CON M) 20 MEQ tablet  TAKE 2 TABLETS THREE TIMES DAILY. TAKE AN EXTRA 2 TABLETS ON DAYS YOU TAKE METOLAZONE 572 tablet 3   risperiDONE (RISPERDAL) 0.5 MG tablet Take 1 tablet (0.5 mg total) by mouth 2 (two) times daily at 8 am and 4 pm. 60 tablet 2   simvastatin (ZOCOR) 10 MG tablet TAKE 1 TABLET EVERY EVENING 90 tablet 3   spironolactone (ALDACTONE) 25 MG tablet TAKE 1 TABLET (25 MG TOTAL) BY MOUTH EVERY EVENING. 90 tablet 3   torsemide (DEMADEX) 20 MG tablet Take 5 tablets (100 mg total) by mouth 2 (two) times daily. 900 tablet 2   No current facility-administered medications for this visit.   Psychiatric Specialty Exam: Physical Exam  Review of Systems  Neurological:        Memory issues  Psychiatric/Behavioral:  Positive for decreased concentration.     Blood pressure 126/70, pulse 75, height 5' (1.524 m), weight 220 lb (99.8 kg).Body mass index is 42.97 kg/m.  General Appearance: Casual  Eye Contact:  Fair  Speech:  Slow  Volume:  Decreased  Mood:  Dysphoric  Affect:  Restricted  Thought Process:  Descriptions of Associations:  Intact  Orientation:  Full (Time, Place, and Person)  Thought Content:   Mild thought blocking but no paranoia or delusions.  Suicidal Thoughts:  No  Homicidal Thoughts:  No  Memory:  Immediate;   Fair Recent;   Fair Remote;   Fair  Judgement:  Fair  Insight:  Shallow  Psychomotor Activity:  Decreased  Concentration:  Concentration: Fair and Attention Span: Fair  Recall:  [employer] of Knowledge:  Fair  Language:  Fair  Akathisia:  No  Handed:  Right  AIMS (if indicated):     Assets:  Communication Skills Desire for Improvement Housing Social Support  ADL's:  Impaired  Cognition:  Impaired,  Mild  Sleep:   fair     Screenings: AUDIT    Flowsheet Row Admission (Discharged) from 07/27/2022 in [facility] BE[facility]IORAL MEDICINE  Alcohol Use Disorder Identification Test Final Score (AUDIT) 0      PHQ2-9    Flowsheet Row Care Coordination from 08/11/2022 in [facility] Care Coordination  PHQ-2 Total Score 1      Flowsheet Row ED from 09/03/2022 in [facility]) Admission (Discharged) from 07/27/2022 in [facility] BE[facility]AVIORAL MEDICINE ED from 07/26/2022 in [facility] Emergency Department at [facility]  C-SSRS RISK CATEGORY No Risk No Risk No Risk        Assessment and Plan: I reviewed the notes of neurology and other providers.  She has given the diagnosis of dementia.  No new medication added.  Her mood is stable but is still having dysphoria when she do not remember things.  Husband is trying to set up pace program which will be very helpful for her.  Encourage keep herself busy and continue cognitive exercise, reading books.  As per husband no aggressive behavior.  We will keep the current medication since it is working and is stable.  May consider stopping the Risperdal in the future.  Continue Wellbutrin XL 150 mg daily, Lexapro 20 mg daily, Klonopin 0.5 mg twice a day and Risperdal 0.5 mg twice a  day.  Encouraged to call us back if is any question or any concern.  Follow-up in 3 months in person.   Collaboration of Care: Collaboration of Care: Other provider involved in patient's care AEB notes are available in epic to  review.  Patient/Guardian was advised Release of Information must be obtained prior to any record release in order to collaborate their care with an outside provider. Patient/Guardian was advised if they have not already done so to contact the registration department to sign all necessary forms in order for [place] to release information regarding their care.   Consent: Patient/Guardian gives verbal consent for treatment and assignment of benefits for services provided during this visit. Patient/Guardian expressed understanding and agreed to proceed.   I provided 31 minutes of face-to-face time to the patient during this encounter  Cleotis Nipper, MD 06/10/2023, 3:31 PM

## 2023-06-11 NOTE — Progress Notes (Signed)
EPIC Encounter for ICM Monitoring  Patient Name: Rhonda [name] is a 73 y.o. female Date: 06/11/2023 Primary Care Physican: Shon Hale, MD Primary Cardiologist: Bensimhon Electrophysiologist: Rae Roam Pacing: 98.8%   06/01/2022 Weight: 193 lbs  07/08/2022 Weight: 183 lbs 08/10/2022 Weight: 185 lbs 11/25/2022 Weight: 180 lbs 12/30/2022 Weight: 184 lbs 04/26/2023 Weight: 214 lbs Per EMT note         Transmission reviewed.               Optivol thoracic impedance suggesting intermittent days with possible fluid accumulation since 12/25.     Prescribed:  [name] Setting with paramedicine program assists with meds. Torsemide 20 mg 5 tablets (100 mg total) by mouth twice a day  Potassium 20 meq 2 tablets (40 mEq total) by mouth 3 (three) times daily.  Take extra 2 tablets on days you take Metolazone.   Metolazone 2.5mg  1 tablet by mouth once a week. Spironolactone 25 mg take 1 tablet by mouth every evening.   Labs: 05/27/2023 Creatinine 1.87, BUN 42, Potassium 3.5, Sodium 136, GFR 29 05/13/2023 Creatinine 1.95, BUN 43, Potassium 3.6, Sodium 134, GFR 27 05/06/2023 Creatinine 2.00, BUN 33, Potassium 4.3, Sodium 140, GFR 26 03/04/2023 Creatinine 1.62, BUN 40, Potassium 4.4, Sodium 139, GFR 34 01/13/2023 Creatinine 1.62, BUN 28, Potassium 3.5, Sodium 134, GFR 34 12/30/2022 Creatinine 1.64, BUN 31, Potassium 3.6, Sodium 134, GFR 33 12/02/2022 Creatinine 1.68 BUN 23,  Potassium 4.0, Sodium 136, GFR 33 08/01/2022 Creatinine 1.47, BUN 42, Potassium 3.1, Sodium 136, GFR 38  07/26/2022 Creatinine 2.02, BUN 25, Potassium 4.6, Sodium 133, GFR 26  07/22/2022 Creatinine 1.57, BUN 20, Potassium 3.8, Sodium 136, GFR 35  07/14/2022 Creatinine 1.32, BUN 20, Potassium 4.2, Sodium 138, GFR 44 06/25/2022 Creatinine 2.19, BUN 29, Potassium 3.5, Sodium 134, GFR 24 A complete set of results can be found in Results Review.   Recommendations:  No changes.    Follow-up plan: ICM clinic phone appointment  on 07/12/2023.   91 day device clinic remote transmission 06/14/2023.     EP/Cardiology Office Visits:  07/14/2023 with HF clinic.  Recall 03/20/2023 with Dr Ladona Ridgel.     Copy of ICM check sent to Dr. Ladona Ridgel.   3 month ICM trend: 06/07/2023.    12-14 Month ICM trend:     Karie Soda, RN 06/11/2023 9:29 AM

## 2023-06-12 ENCOUNTER — Other Ambulatory Visit (HOSPITAL_COMMUNITY): Payer: Self-pay | Admitting: Physician Assistant

## 2023-06-14 ENCOUNTER — Other Ambulatory Visit (HOSPITAL_COMMUNITY): Payer: Self-pay | Admitting: Physician Assistant

## 2023-06-14 ENCOUNTER — Ambulatory Visit (INDEPENDENT_AMBULATORY_CARE_PROVIDER_SITE_OTHER): Payer: Medicare HMO

## 2023-06-14 ENCOUNTER — Other Ambulatory Visit (HOSPITAL_COMMUNITY): Payer: Self-pay

## 2023-06-14 DIAGNOSIS — I5022 Chronic systolic (congestive) heart failure: Secondary | ICD-10-CM | POA: Diagnosis not present

## 2023-06-14 DIAGNOSIS — I428 Other cardiomyopathies: Secondary | ICD-10-CM

## 2023-06-14 LAB — CUP PACEART REMOTE DEVICE CHECK
Battery Remaining Longevity: 46 mo
Battery Voltage: 2.97 V
Brady Statistic AP VP Percent: 0.11 %
Brady Statistic AP VS Percent: 0.01 %
Brady Statistic AS VP Percent: 98.85 %
Brady Statistic AS VS Percent: 1.03 %
Brady Statistic RA Percent Paced: 0.12 %
Brady Statistic RV Percent Paced: 98.73 %
Date Time Interrogation Session: 20250120001604
HighPow Impedance: 70 Ohm
Implantable Lead Connection Status: 753985
Implantable Lead Connection Status: 753985
Implantable Lead Connection Status: 753985
Implantable Lead Implant Date: 20141210
Implantable Lead Implant Date: 20141210
Implantable Lead Implant Date: 20141210
Implantable Lead Location: 753858
Implantable Lead Location: 753859
Implantable Lead Location: 753860
Implantable Lead Model: 4396
Implantable Lead Model: 5076
Implantable Lead Model: 6935
Implantable Pulse Generator Implant Date: 20211118
Lead Channel Impedance Value: 361 Ohm
Lead Channel Impedance Value: 456 Ohm
Lead Channel Impedance Value: 551 Ohm
Lead Channel Impedance Value: 551 Ohm
Lead Channel Impedance Value: 646 Ohm
Lead Channel Impedance Value: 779 Ohm
Lead Channel Pacing Threshold Amplitude: 0.5 V
Lead Channel Pacing Threshold Amplitude: 0.75 V
Lead Channel Pacing Threshold Amplitude: 1.125 V
Lead Channel Pacing Threshold Pulse Width: 0.4 ms
Lead Channel Pacing Threshold Pulse Width: 0.4 ms
Lead Channel Pacing Threshold Pulse Width: 0.4 ms
Lead Channel Sensing Intrinsic Amplitude: 1.625 mV
Lead Channel Sensing Intrinsic Amplitude: 1.625 mV
Lead Channel Sensing Intrinsic Amplitude: 24.125 mV
Lead Channel Sensing Intrinsic Amplitude: 24.125 mV
Lead Channel Setting Pacing Amplitude: 1.25 V
Lead Channel Setting Pacing Amplitude: 2 V
Lead Channel Setting Pacing Amplitude: 2.5 V
Lead Channel Setting Pacing Pulse Width: 0.4 ms
Lead Channel Setting Pacing Pulse Width: 0.4 ms
Lead Channel Setting Sensing Sensitivity: 0.3 mV
Zone Setting Status: 755011
Zone Setting Status: 755011

## 2023-06-14 NOTE — Progress Notes (Signed)
Paramedicine Encounter    Patient ID: Rhonda [name], female    DOB: 1950-11-28, 73 y.o.   MRN: [id number]   Complaints- sore throat, congestion, right foot pain, general fatigue   Assessment- CAOX4, warm and dry, weight up 2 lbs,   Compliance with meds- missed Sunday noon and EVE meds.   Pill box filled- for one week   Refills needed- carvedilol and potassium   Meds changes since last visit- none     Social changes- NONE  Gave her husband info for [facility].    VISIT SUMMARY**  Arrived for home visit for Rhonda [name] who reported to not be feeling well today. She complains of general fatigue with a sore throat. She states she has taken some OTC meds for same with mild relief. Also complained of right foot pain described as her gout pain. She was able to weigh and allow to obtain vitals and assessment as noted. Lower leg edema at baseline, lungs clear. I reviewed medications and filled pill box for one week. Refills as recorded above will need to be called into [facility]- I will do so. We reviewed upcoming appointments and confirmed same. I reminded her the importance of adhering to medication compliance and diet control. She agreed with this and agreed with visit in one week.     Wt 222 lb (100.7 kg)   BMI 43.36 kg/m  Weight yesterday-did not weigh  Last visit weight-- 220lbs      ACTION: Home visit completed     Patient Care Team: Shon Hale, MD as PCP - General (Family Medicine) Burna Sis, LCSW as Social Worker (Licensed Clinical Social Worker) [name], Noberto Retort, DO as Consulting Physician (Neurology) [name], Paramedic as Paramedic Chanetta Marshall, Meridee Score, MD as Attending Physician (Family Medicine) Clinton Gallant, RN as [facility] Care Management Bensimhon, Bevelyn Buckles, MD as Consulting Physician (Cardiology) Lolly Mustache Phillips Grout, MD as Consulting Physician (Psychiatry) Candelaria Stagers, DPM as Consulting Physician (Podiatry)  Patient Active  Problem List   Diagnosis Date Noted   Mild dementia 04/20/2023   Diabetic neuropathy    Elevated troponin 12/02/2021   Acute left ankle pain 10/11/2021   Hyponatremia 10/08/2021   Decreased estrogen level 08/01/2021   Hyperparathyroidism 08/01/2021   Spondylolisthesis 08/01/2021   Varicose veins of bilateral lower extremities with other complications 08/01/2021   Lumbago without sciatica 04/30/2021   Abnormal gait 06/11/2020   Allergic rhinitis 06/11/2020   Asthma 06/11/2020   Benign intracranial hypertension 06/11/2020   Body mass index (BMI) 45.0-49.9, adult 06/11/2020   Bowel incontinence 06/11/2020   Cholelithiasis 06/11/2020   Chronic sinusitis 06/11/2020   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   Daytime somnolence 06/11/2020   Edema 06/11/2020   Family history of malignant neoplasm of gastrointestinal tract 06/11/2020   Gout 06/11/2020   Insomnia 06/11/2020   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   Lumbar spondylosis with myelopathy 06/11/2020   Macrocytosis 06/11/2020   History of colonic polyps 06/11/2020   Repeated falls 06/11/2020   Irritable bowel syndrome 01/04/2020   Spinal stenosis of lumbar region 01/03/2020   Bilateral leg weakness 08/01/2019   Type II diabetes mellitus 06/15/2019   Hypokalemia 11/08/2018   CKD (chronic kidney disease) stage 3, GFR 30-59 ml/min 11/08/2018   Orthostatic hypotension 07/28/2017   On home oxygen therapy    Migraines    Left bundle branch block    Hypothyroidism    Hypertension    Hyperlipidemia    Heart murmur    GERD (gastroesophageal reflux  disease)    Exertional shortness of breath    Major depressive disorder    Back pain    Arthritis    Generalized anxiety disorder    Anemia    Chronic respiratory failure 09/14/2013   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 Use in each nostril as directed, Disp: 30 mL, Rfl: 12   buPROPion (WELLBUTRIN XL) 150 MG 24 hr tablet, Take 1 tablet (150 mg total) by mouth daily., Disp: 30 tablet, Rfl: 2   carvedilol (COREG) 3.125 MG tablet, TAKE 1 TABLET (3.125 MG) BY MOUTH TWICE DAILY WITH MEALS, Disp: 180 tablet, Rfl: 3   citalopram (CELEXA) 20 MG tablet, Take 1 tablet (20 mg total) by mouth daily., Disp: 30 tablet, Rfl: 2   clonazePAM (KLONOPIN) 0.5 MG tablet, Take 1 tablet (0.5 mg total) by mouth 2 (two) times daily., Disp: 60 tablet, Rfl: 2   cyanocobalamin (VITAMIN B12) 1000 MCG tablet, Take 1,000 mcg by mouth daily., Disp: , Rfl:    desloratadine (CLARINEX) 5 MG tablet, Take 1 tablet (5 mg total) by mouth daily., Disp: 90 tablet, Rfl: 3   dicyclomine (BENTYL) 20 MG tablet, Take 20 mg by mouth 3 (three) times daily before meals., Disp: , Rfl:    fluticasone (FLONASE) 50 MCG/ACT nasal spray, Place 1 spray into both nostrils daily., Disp: , Rfl:    gabapentin (NEURONTIN) 100 MG capsule, TAKE 1 CAPSULE (100 MG TOTAL) BY MOUTH 2 (TWO) TIMES DAILY., Disp: 180 capsule, Rfl: 0   levothyroxine (SYNTHROID) 88 MCG tablet, TAKE 1 TABLET(88 MCG) BY MOUTH DAILY BEFORE BREAKFAST, Disp: 90 tablet, Rfl: 3   memantine (NAMENDA) 5 MG tablet, Take 1 tablet (5 mg total) by mouth daily., Disp: 30 tablet, Rfl: 3   metolazone (ZAROXOLYN) 2.5 MG tablet, Take 1 tablet by mouth once a week, Disp: 4 tablet, Rfl: 1   midodrine (PROAMATINE) 5 MG tablet, Take 1 tablet (5 mg total) by mouth 3 (three) times daily., Disp: 200 tablet, Rfl: 3   Multiple Vitamin (MULTIVITAMIN WITH MINERALS) TABS tablet, Take 1 tablet  by mouth daily., Disp: , Rfl:    Ondansetron HCl (ZOFRAN PO), 4 mg. Take 1 tablet on the tongue and allow to dissolve, Disp: , Rfl:    potassium chloride SA (KLOR-CON M) 20 MEQ tablet, TAKE 2 TABLETS THREE TIMES DAILY. TAKE AN EXTRA 2 TABLETS ON DAYS YOU TAKE METOLAZONE, Disp: 572 tablet, Rfl: 3   risperiDONE (RISPERDAL) 0.5 MG tablet, Take 1 tablet (0.5 mg total) by mouth 2 (two) times daily at 8 am and 4 pm., Disp: 60 tablet, Rfl: 2   simvastatin (ZOCOR) 10 MG tablet, TAKE 1 TABLET EVERY EVENING, Disp: 90 tablet, Rfl: 3   spironolactone (ALDACTONE) 25 MG tablet, TAKE 1 TABLET (25 MG TOTAL) BY MOUTH EVERY EVENING., Disp: 90 tablet, Rfl: 3   torsemide (DEMADEX) 20 MG tablet, Take 5 tablets (100 mg total) by mouth 2 (two) times daily., Disp: 900 tablet, Rfl: 2 Allergies  Allergen Reactions   Ceftin Anaphylaxis    Face and throat swell    Cefuroxime Axetil Anaphylaxis    Face and throat swell   Geodon [Ziprasidone Hcl] Hives   Lisinopril Other (See Comments)    angioedema   Cefuroxime     Other reaction(s): anaphylaxis   Sulfacetamide Sodium-Sulfur     Other reaction(s): itch   Allopurinol Nausea Only and Other (See Comments)    weakness   Ativan [Lorazepam] Itching   Sulfa Antibiotics Itching   Valium [Diazepam] Other (See Comments)    Patient states that diazepam doesn't relax, it has the opposite effect.     Social History   Socioeconomic History   Marital status: Married    Spouse name: Not on file   Number of children: 2   Years of education: 47   Highest education level: Master's degree (e.g., MA, MS, MEng, MEd, MSW, MBA)  Occupational History   Occupation: retired  Tobacco Use   Smoking status: Some Days    Current packs/day: 0.00    Average packs/day: 0.1 packs/day for 26.0 years (2.6 ttl pk-yrs)    Types: Cigarettes    Start date: 03/24/1995    Last attempt to quit: 03/23/2021    Years since quitting: 2.2   Smokeless tobacco: Never  Vaping Use   Vaping status:  Never Used  Substance and Sexual Activity   Alcohol use: No   Drug use: No   Sexual activity: Yes  Other Topics Concern   Not on file  Social History Narrative   Tobacco Use Cigarettes: Former Smoker, Quit in 2008   No Alcohol   No recreational drug use   Diet: Regular/Low Carb   Exercise: None   Occupation: disabled   Education: Company secretary, masters   Children: 2   Firearms: No   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      Social Drivers of Health   Financial Resource Strain: Not on file  Food Insecurity: No Food Insecurity (03/31/2023)   Hunger Vital Sign    Worried About Running Out of Food in the Last Year: Never true    Ran Out of Food in the Last Year: Never true  Transportation Needs: No Transportation Needs (03/31/2023)   PRAPARE - Administrator, Civil Service (Medical): No    Lack of Transportation (Non-Medical): No  Physical Activity: Not on file  Stress: No Stress Concern Present (08/11/2022)   [name] of Occupational Health - Occupational Stress Questionnaire    Feeling of Stress : Only a little  Social Connections: Not on file  Intimate Partner Violence: Not At Risk (07/27/2022)   Humiliation, Afraid, Rape, and Kick questionnaire    Fear of Current or Ex-Partner: No    Emotionally Abused: No    Physically Abused: No    Sexually Abused: No    Physical Exam      Future Appointments  Date Time Provider Department Center  06/29/2023  1:30 PM Glendale Chard, DO [facility] None  06/30/2023  1:30 PM [facility] PHARMACIST [facility]  07/12/2023  7:25 AM [facility] DEVICE REMOTES [facility]  07/14/2023 11:30 AM [facility] PA/NP [facility] None  07/22/2023  3:30 PM [name] D, LCSW [facility] None  09/09/2023  3:20 PM Arfeen, Phillips Grout, MD [facility] None  09/13/2023  7:10 AM [facility] DEVICE REMOTES [facility]  09/22/2023  1:45 PM Freddie Breech, DPM [facility]

## 2023-06-22 ENCOUNTER — Other Ambulatory Visit (HOSPITAL_COMMUNITY): Payer: Self-pay

## 2023-06-22 NOTE — Progress Notes (Signed)
Paramedicine Encounter    Patient ID: Rhonda [name], female    DOB: [birth year], 73 y.o.   MRN: [id number]   Med rec for Rhonda [name] today. Rhonda [name] refused full visit today because she wanted to sleep. I reviewed her meds with her husband and filled pill box for one week. Refills called into [facility].   Refills: -carvedilol -namenda -torsemide   I will follow up in one week.   [name], EMT-Paramedic [phone] 06/22/2023     ACTION: Home visit completed     Patient Care Team: Shon Hale, MD as PCP - General (Family Medicine) Burna Sis, LCSW as Social Worker (Licensed Clinical Social Worker) [name], Noberto Retort, DO as Consulting Physician (Neurology) [name], Paramedic as Paramedic Chanetta Marshall, Meridee Score, MD as Attending Physician (Family Medicine) Clinton Gallant, RN as [facility] Care Management Bensimhon, Bevelyn Buckles, MD as Consulting Physician (Cardiology) Cleotis Nipper, MD as Consulting Physician (Psychiatry) Candelaria Stagers, DPM as Consulting Physician (Podiatry)  Patient Active Problem List   Diagnosis Date Noted   Mild dementia 04/20/2023   Diabetic neuropathy    Elevated troponin 12/02/2021   Acute left ankle pain 10/11/2021   Hyponatremia 10/08/2021   Decreased estrogen level 08/01/2021   Hyperparathyroidism 08/01/2021   Spondylolisthesis 08/01/2021   Varicose veins of bilateral lower extremities with other complications 08/01/2021   Lumbago without sciatica 04/30/2021   Abnormal gait 06/11/2020   Allergic rhinitis 06/11/2020   Asthma 06/11/2020   Benign intracranial hypertension 06/11/2020   Body mass index (BMI) 45.0-49.9, adult 06/11/2020   Bowel incontinence 06/11/2020   Cholelithiasis 06/11/2020   Chronic sinusitis 06/11/2020   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   Daytime somnolence 06/11/2020   Edema 06/11/2020   Family history of malignant neoplasm of gastrointestinal tract 06/11/2020   Gout 06/11/2020    Insomnia 06/11/2020   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   Lumbar spondylosis with myelopathy 06/11/2020   Macrocytosis 06/11/2020   History of colonic polyps 06/11/2020   Repeated falls 06/11/2020   Irritable bowel syndrome 01/04/2020   Spinal stenosis of lumbar region 01/03/2020   Bilateral leg weakness 08/01/2019   Type II diabetes mellitus 06/15/2019   Hypokalemia 11/08/2018   CKD (chronic kidney disease) stage 3, GFR 30-59 ml/min 11/08/2018   Orthostatic hypotension 07/28/2017   On home oxygen therapy    Migraines    Left bundle branch block    Hypothyroidism    Hypertension    Hyperlipidemia    Heart murmur    GERD (gastroesophageal reflux disease)    Exertional shortness of breath    Major depressive disorder    Back pain    Arthritis    Generalized anxiety disorder    Anemia    Chronic respiratory failure 09/14/2013   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 Use in each nostril as directed, Disp: 30 mL, Rfl: 12   buPROPion (WELLBUTRIN XL) 150 MG 24 hr tablet, Take 1 tablet (150 mg total)  by mouth daily., Disp: 30 tablet, Rfl: 2   carvedilol (COREG) 3.125 MG tablet, TAKE 1 TABLET (3.125 MG) BY MOUTH TWICE DAILY WITH MEALS, Disp: 180 tablet, Rfl: 3   citalopram (CELEXA) 20 MG tablet, Take 1 tablet (20 mg total) by mouth daily., Disp: 30 tablet, Rfl: 2   clonazePAM (KLONOPIN) 0.5 MG tablet, Take 1 tablet (0.5 mg total) by mouth 2 (two) times daily., Disp: 60 tablet,  Rfl: 2   cyanocobalamin (VITAMIN B12) 1000 MCG tablet, Take 1,000 mcg by mouth daily., Disp: , Rfl:    desloratadine (CLARINEX) 5 MG tablet, Take 1 tablet (5 mg total) by mouth daily., Disp: 90 tablet, Rfl: 3   dicyclomine (BENTYL) 20 MG tablet, Take 20 mg by mouth 3 (three) times daily before meals., Disp: , Rfl:    fluticasone (FLONASE) 50 MCG/ACT nasal spray, Place 1 spray into both nostrils daily., Disp: , Rfl:    gabapentin (NEURONTIN) 100 MG capsule, TAKE 1 CAPSULE (100 MG TOTAL) BY MOUTH 2 (TWO) TIMES DAILY., Disp: 180 capsule, Rfl: 0   levothyroxine (SYNTHROID) 88 MCG tablet, TAKE 1 TABLET(88 MCG) BY MOUTH DAILY BEFORE BREAKFAST, Disp: 90 tablet, Rfl: 3   memantine (NAMENDA) 5 MG tablet, Take 1 tablet (5 mg total) by mouth daily., Disp: 30 tablet, Rfl: 3   metolazone (ZAROXOLYN) 2.5 MG tablet, Take 1 tablet by mouth once a week, Disp: 4 tablet, Rfl: 1   midodrine (PROAMATINE) 5 MG tablet, Take 1 tablet (5 mg total) by mouth 3 (three) times daily., Disp: 200 tablet, Rfl: 3   Multiple Vitamin (MULTIVITAMIN WITH MINERALS) TABS tablet, Take 1 tablet by mouth daily., Disp: , Rfl:    Ondansetron HCl (ZOFRAN PO), 4 mg. Take 1 tablet on the tongue and allow to dissolve, Disp: , Rfl:    potassium chloride SA (KLOR-CON M) 20 MEQ tablet, TAKE 2 TABLETS THREE TIMES DAILY. TAKE AN EXTRA 2 TABLETS ON DAYS YOU TAKE METOLAZONE, Disp: 572 tablet, Rfl: 3   risperiDONE (RISPERDAL) 0.5 MG tablet, Take 1 tablet (0.5 mg total) by mouth 2 (two) times daily at 8 am and 4 pm., Disp: 60 tablet, Rfl: 2   simvastatin (ZOCOR) 10 MG tablet, TAKE 1 TABLET EVERY EVENING, Disp: 90 tablet, Rfl: 3   spironolactone (ALDACTONE) 25 MG tablet, TAKE 1 TABLET (25 MG TOTAL) BY MOUTH EVERY EVENING., Disp: 90 tablet, Rfl: 3   torsemide (DEMADEX) 20 MG tablet, Take 5 tablets (100 mg total) by mouth 2 (two) times daily., Disp: 900 tablet, Rfl: 2 Allergies  Allergen Reactions   Ceftin Anaphylaxis    Face and throat swell     Cefuroxime Axetil Anaphylaxis    Face and throat swell   Geodon [Ziprasidone Hcl] Hives   Lisinopril Other (See Comments)    angioedema   Cefuroxime     Other reaction(s): anaphylaxis   Sulfacetamide Sodium-Sulfur     Other reaction(s): itch   Allopurinol Nausea Only and Other (See Comments)    weakness   Ativan [Lorazepam] Itching   Sulfa Antibiotics Itching   Valium [Diazepam] Other (See Comments)    Patient states that diazepam doesn't relax, it has the opposite effect.     Social History   Socioeconomic History   Marital status: Married    Spouse name: Not on file   Number of children: 2   Years of education: 18   Highest education level: Master's degree (e.g., MA, MS, MEng, MEd, MSW, MBA)  Occupational History   Occupation: retired  Tobacco  Use   Smoking status: Some Days    Current packs/day: 0.00    Average packs/day: 0.1 packs/day for 26.0 years (2.6 ttl pk-yrs)    Types: Cigarettes    Start date: 03/24/1995    Last attempt to quit: 03/23/2021    Years since quitting: 2.2   Smokeless tobacco: Never  Vaping Use   Vaping status: Never Used  Substance and Sexual Activity   Alcohol use: No   Drug use: No   Sexual activity: Yes  Other Topics Concern   Not on file  Social History Narrative   Tobacco Use Cigarettes: Former Smoker, Quit in 2008   No Alcohol   No recreational drug use   Diet: Regular/Low Carb   Exercise: None   Occupation: disabled   Education: Company secretary, masters   Children: 2   Firearms: No   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      Social Drivers of Health   Financial Resource Strain: Not on file  Food Insecurity: No Food Insecurity (03/31/2023)   Hunger Vital Sign    Worried About Running Out of Food in the Last Year: Never true    Ran Out of Food in the Last Year: Never true  Transportation Needs: No Transportation Needs (03/31/2023)   PRAPARE - Administrator, Civil Service  (Medical): No    Lack of Transportation (Non-Medical): No  Physical Activity: Not on file  Stress: No Stress Concern Present (08/11/2022)   [name] of Occupational Health - Occupational Stress Questionnaire    Feeling of Stress : Only a little  Social Connections: Not on file  Intimate Partner Violence: Not At Risk (07/27/2022)   Humiliation, Afraid, Rape, and Kick questionnaire    Fear of Current or Ex-Partner: No    Emotionally Abused: No    Physically Abused: No    Sexually Abused: No    Physical Exam      Future Appointments  Date Time Provider Department Center  06/29/2023  1:30 PM Glendale Chard, DO [facility] None  06/30/2023  1:30 PM [facility] PHARMACIST [facility]  07/12/2023  7:25 AM [facility] DEVICE REMOTES [facility]  07/14/2023 11:30 AM [facility] PA/NP [facility] None  07/22/2023  3:30 PM [name] D, LCSW [facility] None  09/09/2023  3:20 PM Arfeen, Phillips Grout, MD [facility] None  09/13/2023  7:10 AM [facility] DEVICE REMOTES [facility]  09/22/2023  1:45 PM Freddie Breech, DPM [facility]

## 2023-06-28 ENCOUNTER — Other Ambulatory Visit (HOSPITAL_COMMUNITY): Payer: Self-pay

## 2023-06-28 NOTE — Progress Notes (Signed)
Paramedicine Encounter    Patient ID: Rhonda [name], female    DOB: 1950/09/28, 73 y.o.   MRN: [id number]   Complaints- sore throat and cough X3 weeks, fatigue, headache   Assessment- CAOX4, warm and dry, coughing, congested sounding, no acute shortness of breath, some lower left leg edema, lungs noted to have some rhonchi   Compliance with meds- one missed noon dose   Pill box filled- for two weeks   Refills needed-  NEXT week: -torsemide -namenda -risperidone -gabapentin -clonazepam -celexa   Meds changes since last visit- none     Social changes- none    VISIT SUMMARY- Arrived for home visit for [name] who reports to be feeling sick this week- she reports having sore throat, headache, coughing and fatigue. She is taking OTC cold and flu medications with minimum relief as well as Tylenol. I advised her to call her PCP for follow up. She agreed with plan. I obtained vitals and assessment as noted. I reviewed medications and filled pill box for two weeks. No upcoming appointments noted. I reminded her about her fluid and sodium restrictions. She agreed and understood. Home visit complete.    BP 138/70   Pulse 64   Resp 16   Wt 222 lb 14.4 oz (101.1 kg)   SpO2 99%   BMI 43.53 kg/m  Weight yesterday-- didn't weigh  Last visit weight- 222lbs     ACTION: Home visit completed     Patient Care Team: Shon Hale, MD as PCP - General (Family Medicine) Burna Sis, LCSW as Social Worker (Licensed Clinical Social Worker) [name], Noberto Retort, DO as Consulting Physician (Neurology) [name], Paramedic as Paramedic Chanetta Marshall, Meridee Score, MD as Attending Physician (Family Medicine) Clinton Gallant, RN as [facility] Care Management Bensimhon, Bevelyn Buckles, MD as Consulting Physician (Cardiology) Lolly Mustache, Phillips Grout, MD as Consulting Physician (Psychiatry) Candelaria Stagers, DPM as Consulting Physician (Podiatry)  Patient Active Problem List    Diagnosis Date Noted   Mild dementia 04/20/2023   Diabetic neuropathy    Elevated troponin 12/02/2021   Acute left ankle pain 10/11/2021   Hyponatremia 10/08/2021   Decreased estrogen level 08/01/2021   Hyperparathyroidism 08/01/2021   Spondylolisthesis 08/01/2021   Varicose veins of bilateral lower extremities with other complications 08/01/2021   Lumbago without sciatica 04/30/2021   Abnormal gait 06/11/2020   Allergic rhinitis 06/11/2020   Asthma 06/11/2020   Benign intracranial hypertension 06/11/2020   Body mass index (BMI) 45.0-49.9, adult 06/11/2020   Bowel incontinence 06/11/2020   Cholelithiasis 06/11/2020   Chronic sinusitis 06/11/2020   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   Daytime somnolence 06/11/2020   Edema 06/11/2020   Family history of malignant neoplasm of gastrointestinal tract 06/11/2020   Gout 06/11/2020   Insomnia 06/11/2020   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   Lumbar spondylosis with myelopathy 06/11/2020   Macrocytosis 06/11/2020   History of colonic polyps 06/11/2020   Repeated falls 06/11/2020   Irritable bowel syndrome 01/04/2020   Spinal stenosis of lumbar region 01/03/2020   Bilateral leg weakness 08/01/2019   Type II diabetes mellitus 06/15/2019   Hypokalemia 11/08/2018   CKD (chronic kidney disease) stage 3, GFR 30-59 ml/min 11/08/2018   Orthostatic hypotension 07/28/2017   On home oxygen therapy    Migraines    Left bundle branch block    Hypothyroidism    Hypertension    Hyperlipidemia    Heart murmur    GERD (gastroesophageal reflux disease)    Exertional shortness of breath  Major depressive disorder    Back pain    Arthritis    Generalized anxiety disorder    Anemia    Chronic respiratory failure 09/14/2013   Biventricular ICD (implantable cardioverter-defibrillator) in place 08/04/2013   Chronic systolic heart failure 10/27/2012   Endometrial polyp 01/20/2012   Malignant tumor of breast 03/26/2011   Vitamin D deficiency 03/26/2011     Current Outpatient Medications:    acetaZOLAMIDE (DIAMOX) 125 MG tablet, Take 1 tablet (125 mg total) by mouth daily., Disp: 90 tablet, Rfl: 3   Ascorbic Acid (VITAMIN C) 1000 MG tablet, Take 1,000 mg by mouth daily., Disp: , Rfl:    azelastine (ASTELIN) 0.1 % nasal spray, Place 2 sprays into both nostrils 2 (two) times daily. Use in each nostril as directed, Disp: 30 mL, Rfl: 12   buPROPion (WELLBUTRIN XL) 150 MG 24 hr tablet, Take 1 tablet (150 mg total) by mouth daily., Disp: 30 tablet, Rfl: 2   carvedilol (COREG) 3.125 MG tablet, TAKE 1 TABLET (3.125 MG) BY MOUTH TWICE DAILY WITH MEALS, Disp: 180 tablet, Rfl: 3   citalopram (CELEXA) 20 MG tablet, Take 1 tablet (20 mg total) by mouth daily., Disp: 30 tablet, Rfl: 2   clonazePAM (KLONOPIN) 0.5 MG tablet, Take 1 tablet (0.5 mg total) by mouth 2 (two) times daily., Disp: 60 tablet, Rfl: 2   cyanocobalamin (VITAMIN B12) 1000 MCG tablet, Take 1,000 mcg by mouth daily., Disp: , Rfl:    desloratadine (CLARINEX) 5 MG tablet, Take 1 tablet (5 mg total) by mouth daily., Disp: 90 tablet, Rfl: 3   dicyclomine (BENTYL) 20 MG tablet, Take 20 mg by mouth 3 (three) times daily before meals., Disp: , Rfl:    fluticasone (FLONASE) 50 MCG/ACT nasal spray, Place 1 spray into both nostrils daily., Disp: , Rfl:    gabapentin (NEURONTIN) 100 MG capsule, TAKE 1 CAPSULE (100 MG TOTAL) BY MOUTH 2 (TWO) TIMES DAILY., Disp: 180 capsule, Rfl: 0   levothyroxine (SYNTHROID) 88 MCG tablet, TAKE 1 TABLET(88 MCG) BY MOUTH DAILY BEFORE BREAKFAST, Disp: 90 tablet, Rfl: 3   memantine (NAMENDA) 5 MG tablet, Take 1 tablet (5 mg total) by mouth daily., Disp: 30 tablet, Rfl: 3   metolazone (ZAROXOLYN) 2.5 MG tablet, Take 1 tablet by mouth once a week, Disp: 4 tablet, Rfl: 1   midodrine (PROAMATINE) 5 MG tablet, Take 1 tablet (5 mg total) by mouth 3 (three) times daily., Disp: 200 tablet, Rfl: 3   Multiple Vitamin (MULTIVITAMIN WITH MINERALS) TABS tablet, Take 1 tablet by mouth  daily., Disp: , Rfl:    Ondansetron HCl (ZOFRAN PO), 4 mg. Take 1 tablet on the tongue and allow to dissolve, Disp: , Rfl:    potassium chloride SA (KLOR-CON M) 20 MEQ tablet, TAKE 2 TABLETS THREE TIMES DAILY. TAKE AN EXTRA 2 TABLETS ON DAYS YOU TAKE METOLAZONE, Disp: 572 tablet, Rfl: 3   risperiDONE (RISPERDAL) 0.5 MG tablet, Take 1 tablet (0.5 mg total) by mouth 2 (two) times daily at 8 am and 4 pm., Disp: 60 tablet, Rfl: 2   simvastatin (ZOCOR) 10 MG tablet, TAKE 1 TABLET EVERY EVENING, Disp: 90 tablet, Rfl: 3   spironolactone (ALDACTONE) 25 MG tablet, TAKE 1 TABLET (25 MG TOTAL) BY MOUTH EVERY EVENING., Disp: 90 tablet, Rfl: 3   torsemide (DEMADEX) 20 MG tablet, Take 5 tablets (100 mg total) by mouth 2 (two) times daily., Disp: 900 tablet, Rfl: 2 Allergies  Allergen Reactions   Ceftin Anaphylaxis    Face  and throat swell    Cefuroxime Axetil Anaphylaxis    Face and throat swell   Geodon [Ziprasidone Hcl] Hives   Lisinopril Other (See Comments)    angioedema   Cefuroxime     Other reaction(s): anaphylaxis   Sulfacetamide Sodium-Sulfur     Other reaction(s): itch   Allopurinol Nausea Only and Other (See Comments)    weakness   Ativan [Lorazepam] Itching   Sulfa Antibiotics Itching   Valium [Diazepam] Other (See Comments)    Patient states that diazepam doesn't relax, it has the opposite effect.     Social History   Socioeconomic History   Marital status: Married    Spouse name: Not on file   Number of children: 2   Years of education: 85   Highest education level: Master's degree (e.g., MA, MS, MEng, MEd, MSW, MBA)  Occupational History   Occupation: retired  Tobacco Use   Smoking status: Some Days    Current packs/day: 0.00    Average packs/day: 0.1 packs/day for 26.0 years (2.6 ttl pk-yrs)    Types: Cigarettes    Start date: 03/24/1995    Last attempt to quit: 03/23/2021    Years since quitting: 2.2   Smokeless tobacco: Never  Vaping Use   Vaping status: Never  Used  Substance and Sexual Activity   Alcohol use: No   Drug use: No   Sexual activity: Yes  Other Topics Concern   Not on file  Social History Narrative   Tobacco Use Cigarettes: Former Smoker, Quit in 2008   No Alcohol   No recreational drug use   Diet: Regular/Low Carb   Exercise: None   Occupation: disabled   Education: Company secretary, masters   Children: 2   Firearms: No   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      Social Drivers of Health   Financial Resource Strain: Not on file  Food Insecurity: No Food Insecurity (03/31/2023)   Hunger Vital Sign    Worried About Running Out of Food in the Last Year: Never true    Ran Out of Food in the Last Year: Never true  Transportation Needs: No Transportation Needs (03/31/2023)   PRAPARE - Administrator, Civil Service (Medical): No    Lack of Transportation (Non-Medical): No  Physical Activity: Not on file  Stress: No Stress Concern Present (08/11/2022)   [name] of Occupational Health - Occupational Stress Questionnaire    Feeling of Stress : Only a little  Social Connections: Not on file  Intimate Partner Violence: Not At Risk (07/27/2022)   Humiliation, Afraid, Rape, and Kick questionnaire    Fear of Current or Ex-Partner: No    Emotionally Abused: No    Physically Abused: No    Sexually Abused: No    Physical Exam      Future Appointments  Date Time Provider Department Center  06/29/2023  1:30 PM Glendale Chard, DO [facility] None  06/30/2023  1:30 PM [facility] PHARMACIST [facility]  07/12/2023  7:25 AM [facility] DEVICE REMOTES [facility]  07/14/2023 11:30 AM [facility] PA/NP [facility] None  07/22/2023  3:30 PM [name] D, LCSW [facility] None  09/09/2023  3:20 PM Arfeen, Phillips Grout, MD [facility] None  09/13/2023  7:10 AM [facility] DEVICE REMOTES [facility]  09/22/2023  1:45 PM Freddie Breech, DPM [facility]

## 2023-06-29 ENCOUNTER — Ambulatory Visit: Payer: Medicare HMO | Admitting: Neurology

## 2023-06-29 VITALS — BP 92/62 | HR 72 | Ht 60.0 in | Wt 223.0 lb

## 2023-06-29 DIAGNOSIS — F0390 Unspecified dementia without behavioral disturbance: Secondary | ICD-10-CM | POA: Diagnosis not present

## 2023-06-29 DIAGNOSIS — G932 Benign intracranial hypertension: Secondary | ICD-10-CM | POA: Diagnosis not present

## 2023-06-29 DIAGNOSIS — E1140 Type 2 diabetes mellitus with diabetic neuropathy, unspecified: Secondary | ICD-10-CM

## 2023-06-29 MED ORDER — MEMANTINE HCL 10 MG PO TABS: ORAL_TABLET | ORAL | 11 refills | Status: DC

## 2023-06-29 NOTE — Progress Notes (Signed)
 Follow-up Visit   Date: 06/29/23    CLEMENTINA MARENO MRN: [id number] DOB: [birth year]   Interim History: Rhonda [name] is a 73 y.o.  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  The patient was accompanied to the clinic by husband who also provides collateral information.    History of present illness: She reports having falls since November 2017, some which occurred by tripping over objects and loosing her balance.  Falls become much worse during 2018.  She often gets lightheaded preceding her falls and nearly always falls backwards.  Many of her falls were occurring in the kitchen while standing as she was preparing dinner.  She noticed that prolonged standing for 5 to 10 minutes always triggered her falls and was preceded by lightheadedness.  She was found to be orthostatic and started on midodrine  10 mg 3 times daily.   She has been diabetic for at least 10 years and has painful neuropathy of the feet, which is well-controlled on gabapentin  300mg  BID.  She takes extra gabapentin  800mg  as needed for severe pain, about once per week.    Over the past several years, she has noticed problems with short-term memory and family get frustrated with her because she is "slow".  She is able to cook and do laundry.  She manages her own medications and drives, but sometimes feels uncomfortable when driving. On one occasion, she forgot how to put her cars into her ignition or to start the wipers.  She is driving and has not been involved in any MVAs.  Her family complained that she is repeating herself often and sometimes she talks nonsensically.  She manages finances with her husband.  Prior work-up has included: CT cervical spine, CT brain.   Imaging showed increased sclerosis of the calvarium and vertebral bodies.  Her PTH was elevated and she is seeing endocrinology.  Findings also showed possible intracranial hypertension and follow-up LP showed mildly raised ICP at 26mm H20.  There has been no change in her gait or falls following the spinal tap.  She was started on Diamox  125mg  daily.  For memory issues, she underwent neurocognitive testing, however, due to patient being very tired/sleepy, she was unable to engage in the testing, limiting formal diagnosis. However, there was evidence of severe depression and anxiety, which patient endorses.     UPDATE 02/29/20:  She is here for follow-up visit.  The frequency of her falls have reduced and her last fall was about 3 months ago. She is not sure what has helped. She continues to be forgetful, no worse than before.  Prior testing shows changes related to severe depression and anxiety.  She is followed by Dr. Tasia.  Her bigger issue is problems with her legs and gait which led to her seeing Dr. Debby at [facility].  CT myelogram shows severe spinal stenosis at L4-5 and surgery has been offered.  They are undecided on this.   UPDATE 02/27/2021:  She is accompanied by her husband for follow-up visit. She has been doing relatively stable over the past year. Her falls have significantly decreased. She had a few trips over the summer, but these were mechanical.  Her biggest concern remains her memory.  She is unable to keep up with conversations or complex tasks.  She tends to forget to take her medication.  Her neuropsychological testing from October 2020 showed cognitive changes due to severe depression and anxiety, however, testing was also hampered by patient being very sleepy.  She would like to have this again, because of concern of dementia.  Her neuropathy is well-controlled on gabapentin  300mg  three times daily. She rarely takes an extra 800mg  tablet.   UPDATE 06/15/2022:   She complains of daily throbbing headache which lasts all day, worse at the vertex.  She is takes tylenol  daily.  No specific triggers.  She continues to have problems with memory and keeping up with day-to day tasks.  Prior neuropsychiatric testing shows cognitive impairment due to severe depression and anxiety.  She continues to have severe depression and has been provided with a list of counselors, but has not contacted anyone.   She admits to have low mood.  She has CHF and worsening renal function which is being monitored.   UPDATE 06/29/2023:  She is here for follow-up visit.  She had neuropsychological testing which shows significant cognitive impairment consistent with dementia. Her mental health is doing better after switching psychiatrists.  She is seeing Dr. Arfeen now.  She is able to bathe, dress, and feed herself.  She does microwave meals, but sometimes forgets to eat.  Medications are prepared weekly by paramedic and husband ensure that she takes them.  Husband works for [employer] from 11:30a - 9pm.  They have a caregiver that comes twice per week in the afternoon.    Medications:  Current Outpatient Medications on File Prior to Visit  Medication Sig Dispense Refill   acetaZOLAMIDE  (DIAMOX ) 125 MG tablet Take 1 tablet (125 mg total) by mouth daily. 90 tablet 3   Ascorbic Acid  (VITAMIN C ) 1000 MG tablet Take 1,000 mg by mouth daily.     azelastine  (ASTELIN ) 0.1 % nasal spray Place 2 sprays into both nostrils 2 (two) times daily. Use in each nostril as directed 30 mL 12   buPROPion  (WELLBUTRIN  XL) 150 MG 24 hr tablet Take 1 tablet (150 mg total) by mouth daily. 30 tablet 2   carvedilol  (COREG ) 3.125 MG tablet TAKE 1 TABLET (3.125 MG) BY MOUTH TWICE DAILY WITH MEALS 180 tablet 3   citalopram  (CELEXA ) 20 MG tablet Take 1 tablet (20 mg total) by mouth daily. 30 tablet 2   clonazePAM  (KLONOPIN ) 0.5 MG tablet Take 1 tablet (0.5 mg total) by mouth 2 (two) times daily. 60 tablet 2    cyanocobalamin  (VITAMIN B12) 1000 MCG tablet Take 1,000 mcg by mouth daily.     desloratadine  (CLARINEX ) 5 MG tablet Take 1 tablet (5 mg total) by mouth daily. 90 tablet 3   dicyclomine  (BENTYL ) 20 MG tablet Take 20 mg by mouth 3 (three) times daily before meals.     fluticasone  (FLONASE ) 50 MCG/ACT nasal spray Place 1 spray into both nostrils daily.     gabapentin  (NEURONTIN ) 100 MG capsule TAKE 1 CAPSULE (100 MG TOTAL) BY MOUTH 2 (TWO) TIMES DAILY. 180 capsule 0   levothyroxine  (SYNTHROID ) 88 MCG tablet TAKE 1 TABLET(88 MCG) BY MOUTH DAILY BEFORE BREAKFAST 90 tablet 3   memantine  (NAMENDA ) 5 MG tablet Take 1 tablet (5 mg total) by mouth daily. 30 tablet 3   metolazone  (ZAROXOLYN ) 2.5 MG tablet Take 1 tablet by mouth once a week 4 tablet 1   midodrine  (PROAMATINE ) 5 MG tablet Take 1 tablet (5 mg total) by mouth 3 (three) times daily. 200 tablet 3  Multiple Vitamin (MULTIVITAMIN WITH MINERALS) TABS tablet Take 1 tablet by mouth daily.     Ondansetron  HCl (ZOFRAN  PO) 4 mg. Take 1 tablet on the tongue and allow to dissolve     potassium chloride  SA (KLOR-CON  M) 20 MEQ tablet TAKE 2 TABLETS THREE TIMES DAILY. TAKE AN EXTRA 2 TABLETS ON DAYS YOU TAKE METOLAZONE  572 tablet 3   risperiDONE  (RISPERDAL ) 0.5 MG tablet Take 1 tablet (0.5 mg total) by mouth 2 (two) times daily at 8 am and 4 pm. 60 tablet 2   simvastatin  (ZOCOR ) 10 MG tablet TAKE 1 TABLET EVERY EVENING 90 tablet 3   spironolactone  (ALDACTONE ) 25 MG tablet TAKE 1 TABLET (25 MG TOTAL) BY MOUTH EVERY EVENING. 90 tablet 3   torsemide  (DEMADEX ) 20 MG tablet Take 5 tablets (100 mg total) by mouth 2 (two) times daily. 900 tablet 2   No current facility-administered medications on file prior to visit.    Allergies:  Allergies  Allergen Reactions   Ceftin Anaphylaxis    Face and throat swell    Cefuroxime Axetil Anaphylaxis    Face and throat swell   Geodon [Ziprasidone Hcl] Hives   Lisinopril  Other (See Comments)    angioedema    Cefuroxime     Other reaction(s): anaphylaxis   Sulfacetamide Sodium-Sulfur      Other reaction(s): itch   Allopurinol  Nausea Only and Other (See Comments)    weakness   Ativan  [Lorazepam ] Itching   Sulfa Antibiotics Itching   Valium  [Diazepam ] Other (See Comments)    Patient states that diazepam  doesn't relax, it has the opposite effect.    Vital Signs:  BP 92/62   Pulse 72   Ht 5' (1.524 m)   Wt 223 lb (101.2 kg)   SpO2 99%   BMI 43.55 kg/m   No data found.  Neurological Exam: MENTAL STATUS including orientation to time, place, person, recent and remote memory is attention span and concentration, language, and fund of knowledge is fair.  Follows commands.  Repeats herself at times.   Speech is slow and dysarthric (baseline).  CRANIAL NERVES:  Pupils are round and reactive. Extraocular muscles are intact throughout, except mild restriction with upgaze.  No ptosis.   MOTOR:  Motor strength is 5/5 in all extremities. No pronator drift.  Tone is normal.  No tremor.   REFLEXES:  Reflexes are 2+/4 throughout except 3+/4 at the knees bilaterally.   COORDINATION/GAIT:  Gait is slightly-wide based, unassisted.   Data: Labs 07/31/2016:  Vitamin B1 11, vitamin B12 597, copper  162, SPEP with IFE no M protein  CT head wo contrast performed at [facility] 11/24/2017: No acute intracranial abnormality appreciated.  Expanded empty sella may reflect idiopathic intracranial hypertension.  Thick dense calvarium which is of uncertain clinical significance.  Boney sclerosis can be seen with, but not limited to, hematological conditions such as myelofibrosis, metabolic bone disorders such as hyperthyroidism and hyperparathyroidism and other causes.  Correlate clinically.   CT cervical spine wo contrast 11/24/2017: 1.  No acute fracture 2.  No significant spinal canal or foraminal stenosis 3.  Increased sclerosis of the bony structures.  Boney sclerosis can be seen with, but not limited to,  hematological conditions such as myelofibrosis, metabolic bone disorders such as hyperthyroidism and hyperparathyroidism and other causes.  Correlate clinically.   Neuropsychological testing 03/15/2019:  severe symptoms of both depression and anxiety.   IMPRESSION/PLAN: 1.  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  Intracranial hypertension OP 26cm H2O, stable no visual changes.  - Continue Diamox  125mg  daily  4.  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  Stable - Continue gabapentin  100mg  twice daily - renally dosed  5.  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    - Previously seen Dr. Debby, she is not interested in surgery  Return to clinic in 1 year   Thank you for allowing me to participate in patient's care.  If I can answer any additional questions, I would be pleased to do so.    Sincerely,

## 2023-06-30 ENCOUNTER — Other Ambulatory Visit (HOSPITAL_COMMUNITY): Payer: Self-pay

## 2023-06-30 ENCOUNTER — Encounter: Payer: Self-pay | Admitting: Student

## 2023-06-30 ENCOUNTER — Ambulatory Visit: Payer: Medicare HMO | Attending: Internal Medicine | Admitting: Student

## 2023-06-30 ENCOUNTER — Telehealth: Payer: Self-pay | Admitting: Pharmacist

## 2023-06-30 ENCOUNTER — Telehealth: Payer: Self-pay

## 2023-06-30 DIAGNOSIS — E1121 Type 2 diabetes mellitus with diabetic nephropathy: Secondary | ICD-10-CM | POA: Diagnosis not present

## 2023-06-30 MED ORDER — MOUNJARO 2.5 MG/0.5ML ~~LOC~~ SOAJ: 2.5000 mg | SUBCUTANEOUS | 0 refills | Status: DC

## 2023-06-30 NOTE — Patient Instructions (Signed)
Meal Planning: Meal planning is the key to setting you up for success. Here are some examples of healthy meal options. Breakfast   Option 1:  Omelette with vegetables (1 egg, spinach, mushrooms, or other vegetable of your choice), 2 slices whole-grain toast, tip of thumb size butter or soft margarine,  cup low-fat milk or yogurt  Option 2: steel-cut rolled oats (? cup dry), 1 tbsp peanut butter added to cooked oats,  cup low-fat milk. Option 3: 2 slices whole-grain or rye toast with avocado spread ( small avocado mased with herbs and pepper to taste), 1 poached egg or [place] up (cooked to your liking) Option 4:  cup plain 0% [place] yogurt topped with  cup berries and  cup walnuts or almonds, 2 slices whole-grain or rye toast, tip of thumb size soft margarine/butter Lunch:  Option 1: 2 cups red lentil soup, green salad with 1 tbsp homemade vinaigrette (extra virgin olive oil and vinegar of choice plus spices) Option 2: 3 oz. roasted chicken, 2 slices whole-grain bread, 2 tsp mayonnaise, mustard, lettuce, tomato if desired, 1 fruit (example: medium-sized apple or small pear) Option 3: 3 oz. tuna packed in water, 1 whole-wheat pita (6 inch), 2 tsp mayonnaise, lettuce, tomato, or other non-starchy vegetable of your choice, 1 fruit (example: medium-sized apple or small pear) Option 4: 1 serving of garden veggie buddha bowl with lentils and tahini sauce and 1 cup berries topped with  cup plain 0% Greek yogurt Dinner:  Option 1: 1 serving roasted cauliflower salad, 3-4 oz.  grilled or baked pork loin chop, 1/2 cup mashed potato, or brown rice or quinoa  Option 2: 1 serving fish (baked, grilled or air fried), green salad, 1 tbsp homemade vinaigrette,  cup cooked couscous Option 3: 1 cup cooked whole grained pasta (example: spaghetti, spirals, macaroni),  cup favorite pasta sauce (preferably homemade), 3-4 oz.  grilled or baked chicken, green salad, 1 tbsp homemade vinaigrette Option 4: 1 serving  oven roasted salmon,  cup mashed sweet potato or couscous or brown rice or quinoa, broccoli (steamed or roasted) Healthy snacks:  Carrots or celery with 1 tbsp of hummus  1 medium-sized fruit (apple or orange) 1 cup plain 0% [place] yogurt with  cup berries Half apple, sliced, with 1 tbsp (15 mL) peanut or almond butter  Dining out:  Eating away from home has become a part of many people's lifestyle. Making healthy choices when you are eating out is important too. Portion size is an important part of healthy choices. Most branded fast-food places provide calories, sodium, and fat content for their menu items. www.calorieking.com would be great resource to find nutrition facts for your favorite brands and fast-food restaurants. Company specific website can be Chief Technology Officer for nutrition information for their items. (e.g. www.mcdonalds.com or www.nutritionix.com/[place]/menu/premium)  Here are some tips to help you make wise food choices when you are dining out.  Chose more often  Avoid   Beverages  Water, low fat milk  Sugar-free/diet drinks  Unsweet tea or coffee Milkshakes, fruit drinks, regular pop Alcohol, specialty drinks (e.g. iced cappuccino)  Fast food Garden salad Mini subs, pita sandwiches ect with extra vegetables plain burgers, grilled chicken Vegetarian or cheese pizza with whole-grain crust Burgers/sandwiches with bacon, cheese, and high-fat sauces [place] fries, fried chicken, fried fish, poutine, hash browns Pizza with processed meats  Starters Raw vegetables, salads (garden, spinach, fruit) clear or vegetable soups Seafood cocktail Whole-grain breads and rolls Salads with high-fat dressings or toppings   Creamy  soups  Wings, egg rolls  onion rings, nachos  White or garlic bread  Main courses Grains & Starches (amount equal to  of your plate)   Oatmeal, high-fiber/lower-sugar cereals  Whole-grain breads, rice, pasta, barley, couscous   Sweet potatoes Sugary,  low-fiber cereals  Large bagels, muffins, croissants, white bread  [place] fries, hash browns, fried rice   Meat and alternative (amount equal to  of your plate)   Lean meats, poultry, fish, eggs, low-fat cheese  Tofu, vegetable protein Legumes (e.g. lentils, chickpeas, beans) High-salt and/or high-fat meats (e.g. ribs, wings, sausages, wieners, processed lunch meats, imposter meats)   Vegetables (amount equal to  of your plate)   Salads ([place], garden, spinach), plain vegetables  Vegetables on sandwiches ect Salads with creamy, high-fat dressings and toppings like bacon bits, croutons, cheese  Desserts Fresh fruit, frozen yogourt, skim milk latte Cakes, pies, pastries, ice cream, cheesecake

## 2023-06-30 NOTE — Telephone Encounter (Signed)
GLP1 PA requested

## 2023-06-30 NOTE — Telephone Encounter (Signed)
 Pharmacy Patient Advocate Encounter   Received notification from Physician's Office that prior authorization for MOUNJARO  is required/requested.   Insurance verification completed.   The patient is insured through [employer] .   Per test claim: The current 28 day co-pay is, $12.15.  No PA needed at this time. This test claim was processed through [facility]- copay amounts may vary at other pharmacies due to pharmacy/plan contracts, or as the patient moves through the different stages of their insurance plan.

## 2023-06-30 NOTE — Progress Notes (Signed)
 Patient ID: Rhonda [name]                 DOB: 29-Jul-1950                    MRN: [id number]      HPI: Rhonda [name] is a 73 y.o. female patient referred to pharmacy clinic by Dr.Bensimhon to initiate weight loss therapy with GLP1-RA. PMH is significant for obesity complicated by chronic medical conditions including dementia, T2DM, hypertension, CHF, diabetic neuropathy, HLD. Most recent BMI 43.55, weight 223 lbs   Current weight management medications: none    Baseline weight/BMI:223 lbs/ 43.55     Diet:  -Breakfast: grits and eggs and biscuits  -Lunch: sandwich  -Dinner: cafeteria dinner - baked fish/ chicken mashed potatoes with some greens  -Snacks: none  -Drinks: water, ice tea   Exercise: none - motivated to start walking 5-10 min twice daily    Family History:  Relation Problem Comments  Mother (Alive) Alzheimer's disease   CVA   Hypertension     Father (Deceased at age 53) Cancer - Colon late 41    Paternal Uncle Birth defects      Social History:   Labs: Lab Results  Component Value Date   HGBA1C 7.0 (H) 10/09/2021    Wt Readings from Last 1 Encounters:  06/29/23 223 lb (101.2 kg)    BP Readings from Last 1 Encounters:  06/29/23 92/62   Pulse Readings from Last 1 Encounters:  06/29/23 72       Component Value Date/Time   CHOL 133 07/31/2022 1332   TRIG 67 07/31/2022 1332   HDL 51 07/31/2022 1332   CHOLHDL 2.6 07/31/2022 1332   VLDL 13 07/31/2022 1332   LDLCALC 69 07/31/2022 1332    Past Medical History:  Diagnosis Date   Abnormal gait 06/11/2020   Acute left ankle pain 10/11/2021   AKI (acute kidney injury) 10/08/2021   Allergic rhinitis 06/11/2020   Anemia    Arthritis    Right knee   Asthma 06/11/2020   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   Back pain    Disk problem   Benign intracranial hypertension 06/11/2020   Bilateral leg weakness 08/01/2019   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    Body mass index (BMI) 45.0-49.9, adult 06/11/2020   Bowel incontinence 06/11/2020   Cardiomyopathy    Cholelithiasis 06/11/2020   Chronic respiratory failure 09/14/2013   Chronic sinusitis 06/11/2020   Chronic systolic heart failure 10/27/2012   a) NICM b) ECHO (03/2013) EF 20-25% c) ECHO (09/2013) EF 45-50%, grade I DD   CKD (chronic kidney disease) stage 3, GFR 30-59 ml/min 11/08/2018   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   Complication of anesthesia    History of low blood pressure after surgery; attributed to lying flat   Daytime somnolence 06/11/2020   Decreased estrogen level 08/01/2021   Diabetic neuropathy    Diarrhea of presumed infectious origin 06/11/2020   Edema 06/11/2020   Elevated troponin 12/02/2021   Endometrial polyp 01/20/2012   Exertional shortness of breath    Generalized anxiety disorder    GERD (gastroesophageal reflux disease)    Gout 06/11/2020   Heart murmur    History of colonic polyps 06/11/2020   History of fall 06/11/2020   Hyperlipidemia    Hyperparathyroidism 08/01/2021   Hypertension    Hypokalemia 11/08/2018   Hyponatremia 10/08/2021   Hypothyroidism    Insomnia 06/11/2020   Intractable nausea and vomiting 10/08/2021  Irritable bowel syndrome 01/04/2020   Left bundle branch block    s/p CRT-D (04/2013)   Lumbago without sciatica 04/30/2021   Lumbar spondylosis with myelopathy 06/11/2020   Macrocytosis 06/11/2020   Major depressive disorder    Malignant tumor of breast 03/26/2011   Left; completed chemotherapy and radiation treatments   Migraines    Mild dementia 04/20/2023   On home oxygen  therapy    2L suppose to be q night (05/03/2013)   Orthostatic hypotension 07/28/2017   Spinal stenosis of lumbar region 01/03/2020   Spondylolisthesis 08/01/2021   SVD (spontaneous vaginal delivery)    x 2   Type II diabetes mellitus 06/15/2019   Varicose veins of bilateral lower extremities with other complications 08/01/2021   Vitamin D  deficiency  03/26/2011    Current Outpatient Medications on File Prior to Visit  Medication Sig Dispense Refill   acetaminophen  (TYLENOL ) 500 MG tablet Take 500 mg by mouth every 6 (six) hours as needed.     acetaZOLAMIDE  (DIAMOX ) 125 MG tablet Take 1 tablet (125 mg total) by mouth daily. 90 tablet 3   azelastine  (ASTELIN ) 0.1 % nasal spray Place 2 sprays into both nostrils 2 (two) times daily. Use in each nostril as directed 30 mL 12   buPROPion  (WELLBUTRIN  XL) 150 MG 24 hr tablet Take 1 tablet (150 mg total) by mouth daily. 30 tablet 2   Capsaicin 0.1 % CREA Apply topically.     carvedilol  (COREG ) 3.125 MG tablet TAKE 1 TABLET (3.125 MG) BY MOUTH TWICE DAILY WITH MEALS 180 tablet 3   citalopram  (CELEXA ) 20 MG tablet Take 1 tablet (20 mg total) by mouth daily. 30 tablet 2   clonazePAM  (KLONOPIN ) 0.5 MG tablet Take 1 tablet (0.5 mg total) by mouth 2 (two) times daily. 60 tablet 2   cyanocobalamin  (VITAMIN B12) 1000 MCG tablet Take 1,000 mcg by mouth daily.     desloratadine  (CLARINEX ) 5 MG tablet Take 1 tablet (5 mg total) by mouth daily. 90 tablet 3   dicyclomine  (BENTYL ) 20 MG tablet Take 20 mg by mouth 3 (three) times daily before meals.     famotidine (PEPCID) 20 MG tablet Take 20 mg by mouth 2 (two) times daily.     fluticasone  (FLONASE ) 50 MCG/ACT nasal spray Place 1 spray into both nostrils daily.     gabapentin  (NEURONTIN ) 100 MG capsule TAKE 1 CAPSULE (100 MG TOTAL) BY MOUTH 2 (TWO) TIMES DAILY. 180 capsule 0   HYDROcodone -acetaminophen  (NORCO/VICODIN) 5-325 MG tablet Take 1 tablet by mouth every 6 (six) hours as needed for moderate pain (pain score 4-6).     levothyroxine  (SYNTHROID ) 88 MCG tablet TAKE 1 TABLET(88 MCG) BY MOUTH DAILY BEFORE BREAKFAST 90 tablet 3   memantine  (NAMENDA ) 10 MG tablet Take half tablet twice daily x 1 week, then increase to half tablet in the morning and 1 tablet at bedtime x 1 week, then 1 tablet BID 60 tablet 11   metolazone  (ZAROXOLYN ) 2.5 MG tablet Take 1 tablet by  mouth once a week 4 tablet 1   midodrine  (PROAMATINE ) 5 MG tablet Take 1 tablet (5 mg total) by mouth 3 (three) times daily. (Patient taking differently: Take 10 mg by mouth 3 (three) times daily.) 200 tablet 3   Multiple Vitamin (MULTIVITAMIN WITH MINERALS) TABS tablet Take 1 tablet by mouth daily.     nystatin (MYCOSTATIN) 100000 UNIT/ML suspension Take 5 mLs by mouth 4 (four) times daily.     Ondansetron  HCl (ZOFRAN  PO) 4 mg.  Take 1 tablet on the tongue and allow to dissolve     pantoprazole  (PROTONIX ) 40 MG tablet Take 40 mg by mouth daily.     phenol (CHLORASEPTIC) 1.4 % LIQD Use as directed 1 spray in the mouth or throat as needed for throat irritation / pain.     potassium chloride  SA (KLOR-CON  M) 20 MEQ tablet TAKE 2 TABLETS THREE TIMES DAILY. TAKE AN EXTRA 2 TABLETS ON DAYS YOU TAKE METOLAZONE  572 tablet 3   risperiDONE  (RISPERDAL ) 0.5 MG tablet Take 1 tablet (0.5 mg total) by mouth 2 (two) times daily at 8 am and 4 pm. (Patient not taking: Reported on 06/29/2023) 60 tablet 2   simvastatin  (ZOCOR ) 10 MG tablet TAKE 1 TABLET EVERY EVENING 90 tablet 3   spironolactone  (ALDACTONE ) 25 MG tablet TAKE 1 TABLET (25 MG TOTAL) BY MOUTH EVERY EVENING. 90 tablet 3   tizanidine  (ZANAFLEX ) 2 MG capsule Take 2 mg by mouth daily.     torsemide  (DEMADEX ) 20 MG tablet Take 5 tablets (100 mg total) by mouth 2 (two) times daily. 900 tablet 2   No current facility-administered medications on file prior to visit.    Allergies  Allergen Reactions   Ceftin Anaphylaxis    Face and throat swell    Cefuroxime Axetil Anaphylaxis    Face and throat swell   Geodon [Ziprasidone Hcl] Hives   Lisinopril  Other (See Comments)    angioedema   Cefuroxime     Other reaction(s): anaphylaxis   Sulfacetamide Sodium-Sulfur      Other reaction(s): itch   Allopurinol  Nausea Only and Other (See Comments)    weakness   Ativan  [Lorazepam ] Itching   Sulfa Antibiotics Itching   Valium  [Diazepam ] Other (See Comments)     Patient states that diazepam  doesn't relax, it has the opposite effect.     Assessment/Plan:  1. Weight loss - Patient has not met goal of at least 5% of body weight loss with comprehensive lifestyle modifications alone in the past 3-6 months. And her A1c is 7. Given diabetes with CVD, GLP1 Pharmacotherapy is appropriate to pursue as augmentation. Will start Mounjaro . Confirmed patient  has no personal or family history of medullary thyroid  carcinoma (MTC) or Multiple Endocrine Neoplasia syndrome type 2 (MEN 2).   Advised patient on common side effects including nausea, diarrhea, dyspepsia, decreased appetite, and fatigue. Counseled patient on reducing meal size and how to titrate medication to minimize side effects. Counseled patient to call if intolerable side effects or if experiencing dehydration, abdominal pain, or dizziness. Patient will adhere to dietary modifications and will target at least 150 minutes of moderate intensity exercise weekly.   Follow up in 4 weeks  .

## 2023-07-03 ENCOUNTER — Other Ambulatory Visit (HOSPITAL_COMMUNITY): Payer: Self-pay | Admitting: Internal Medicine

## 2023-07-03 DIAGNOSIS — I5022 Chronic systolic (congestive) heart failure: Secondary | ICD-10-CM

## 2023-07-12 ENCOUNTER — Ambulatory Visit: Payer: Medicare HMO | Attending: Internal Medicine

## 2023-07-12 ENCOUNTER — Other Ambulatory Visit (HOSPITAL_COMMUNITY): Payer: Self-pay

## 2023-07-12 DIAGNOSIS — I5022 Chronic systolic (congestive) heart failure: Secondary | ICD-10-CM | POA: Diagnosis not present

## 2023-07-12 DIAGNOSIS — Z95810 Presence of automatic (implantable) cardiac defibrillator: Secondary | ICD-10-CM | POA: Diagnosis not present

## 2023-07-12 NOTE — Progress Notes (Signed)
Paramedicine Encounter    Patient ID: Rhonda [name], female    DOB: 26-Oct-1950, 73 y.o.   MRN: [id number]   Complaints- right foot pain, cough, general fatigue, general sore throat and congestion (baseline for her)  Assessment- laying in bed, says she doesn't feel good, has right foot pain- feels like gout flare up, some lower leg edema noted more so in the right lower leg. Lung sounds noted to have rhonchi and wheezing present. Congestion upon coughing.   Compliance with meds- missed two noon doses and one evening dose   Pill box filled- one week   Refills needed-  Clonazepam Celexa Bentyl Risperidone   Meds changes since last visit- memantine increase     Social changes- none    VISIT SUMMARY- Arrived for home visit for Rhonda [name] who reports to not be feeling good today. She reports she has general fatigue, sore throat, headache, right foot pain, cough with congestion. She has some lower right leg edema, lungs noted to have rhonchi and wheezing. She is coughing with congestion noted. She was asleep prior to me arriving and urinated in the bed and on herself. She says this is not like her to do this. She was able to get up and get herself cleaned up and I assisted getting her bed sheets off the bed and into her laundry room. [name] was very depressed over being at home all the time and having trouble with things like this happening mentally. I encouraged her and tried to offer suggestions. Assessment and vitals as noted. I reviewed meds and filled pill box for one week. Refills as noted called into [facility]. I plan to see Teauna in the clinic on Wednesday. Home visit complete. I advised her husband to reach out to PCP about her coughing and gout issues. He agreed. Visit complete.   BP (!) 110/58   Pulse 70   Resp 16   SpO2 97%  Weight yesterday-didn't weigh  Last visit weight-223lbs     ACTION: Home visit completed     Patient Care Team: Shon Hale, MD as PCP -  General (Family Medicine) Burna Sis, LCSW as Social Worker (Licensed Clinical Social Worker) [name], Noberto Retort, DO as Consulting Physician (Neurology) [name], Paramedic as Paramedic Chanetta Marshall, Meridee Score, MD as Attending Physician (Family Medicine) Clinton Gallant, RN as [facility] Care Management Bensimhon, Bevelyn Buckles, MD as Consulting Physician (Cardiology) Lolly Mustache Phillips Grout, MD as Consulting Physician (Psychiatry) Candelaria Stagers, DPM as Consulting Physician (Podiatry)  Patient Active Problem List   Diagnosis Date Noted   Mild dementia 04/20/2023   Diabetic neuropathy    Elevated troponin 12/02/2021   Acute left ankle pain 10/11/2021   Hyponatremia 10/08/2021   Decreased estrogen level 08/01/2021   Hyperparathyroidism 08/01/2021   Spondylolisthesis 08/01/2021   Varicose veins of bilateral lower extremities with other complications 08/01/2021   Lumbago without sciatica 04/30/2021   Abnormal gait 06/11/2020   Allergic rhinitis 06/11/2020   Asthma 06/11/2020   Benign intracranial hypertension 06/11/2020   Body mass index (BMI) 45.0-49.9, adult 06/11/2020   Bowel incontinence 06/11/2020   Cholelithiasis 06/11/2020   Chronic sinusitis 06/11/2020   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   Daytime somnolence 06/11/2020   Edema 06/11/2020   Family history of malignant neoplasm of gastrointestinal tract 06/11/2020   Gout 06/11/2020   Insomnia 06/11/2020   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   Lumbar spondylosis with myelopathy 06/11/2020   Macrocytosis 06/11/2020   History of colonic polyps 06/11/2020  Repeated falls 06/11/2020   Irritable bowel syndrome 01/04/2020   Spinal stenosis of lumbar region 01/03/2020   Bilateral leg weakness 08/01/2019   Type II diabetes mellitus 06/15/2019   Hypokalemia 11/08/2018   CKD (chronic kidney disease) stage 3, GFR 30-59 ml/min 11/08/2018   Orthostatic hypotension 07/28/2017   On home oxygen therapy    Migraines    Left bundle branch  block    Hypothyroidism    Hypertension    Hyperlipidemia    Heart murmur    GERD (gastroesophageal reflux disease)    Exertional shortness of breath    Major depressive disorder    Back pain    Arthritis    Generalized anxiety disorder    Anemia    Chronic respiratory failure 09/14/2013   Biventricular ICD (implantable cardioverter-defibrillator) in place 08/04/2013   Chronic systolic heart failure 10/27/2012   Endometrial polyp 01/20/2012   Malignant tumor of breast 03/26/2011   Vitamin D deficiency 03/26/2011    Current Outpatient Medications:    acetaminophen (TYLENOL) 500 MG tablet, Take 500 mg by mouth every 6 (six) hours as needed., Disp: , Rfl:    acetaZOLAMIDE (DIAMOX) 125 MG tablet, Take 1 tablet (125 mg total) by mouth daily., Disp: 90 tablet, Rfl: 3   azelastine (ASTELIN) 0.1 % nasal spray, Place 2 sprays into both nostrils 2 (two) times daily. Use in each nostril as directed, Disp: 30 mL, Rfl: 12   buPROPion (WELLBUTRIN XL) 150 MG 24 hr tablet, Take 1 tablet (150 mg total) by mouth daily., Disp: 30 tablet, Rfl: 2   Capsaicin 0.1 % CREA, Apply topically., Disp: , Rfl:    carvedilol (COREG) 3.125 MG tablet, TAKE 1 TABLET (3.125 MG) BY MOUTH TWICE DAILY WITH MEALS, Disp: 180 tablet, Rfl: 3   citalopram (CELEXA) 20 MG tablet, Take 1 tablet (20 mg total) by mouth daily., Disp: 30 tablet, Rfl: 2   clonazePAM (KLONOPIN) 0.5 MG tablet, Take 1 tablet (0.5 mg total) by mouth 2 (two) times daily., Disp: 60 tablet, Rfl: 2   cyanocobalamin (VITAMIN B12) 1000 MCG tablet, Take 1,000 mcg by mouth daily., Disp: , Rfl:    desloratadine (CLARINEX) 5 MG tablet, Take 1 tablet (5 mg total) by mouth daily., Disp: 90 tablet, Rfl: 3   dicyclomine (BENTYL) 20 MG tablet, Take 20 mg by mouth 3 (three) times daily before meals., Disp: , Rfl:    famotidine (PEPCID) 20 MG tablet, Take 20 mg by mouth 2 (two) times daily., Disp: , Rfl:    fluticasone (FLONASE) 50 MCG/ACT nasal spray, Place 1 spray into  both nostrils daily., Disp: , Rfl:    gabapentin (NEURONTIN) 100 MG capsule, TAKE 1 CAPSULE (100 MG TOTAL) BY MOUTH 2 (TWO) TIMES DAILY., Disp: 180 capsule, Rfl: 0   HYDROcodone-acetaminophen (NORCO/VICODIN) 5-325 MG tablet, Take 1 tablet by mouth every 6 (six) hours as needed for moderate pain (pain score 4-6)., Disp: , Rfl:    levothyroxine (SYNTHROID) 88 MCG tablet, TAKE 1 TABLET(88 MCG) BY MOUTH DAILY BEFORE BREAKFAST, Disp: 90 tablet, Rfl: 3   memantine (NAMENDA) 10 MG tablet, Take half tablet twice daily x 1 week, then increase to half tablet in the morning and 1 tablet at bedtime x 1 week, then 1 tablet BID, Disp: 60 tablet, Rfl: 11   metolazone (ZAROXOLYN) 2.5 MG tablet, TAKE 1 TABLET BY MOUTH ONCE A WEEK, Disp: 4 tablet, Rfl: 1   midodrine (PROAMATINE) 5 MG tablet, Take 1 tablet (5 mg total) by mouth 3 (three)  times daily. (Patient taking differently: Take 10 mg by mouth 3 (three) times daily.), Disp: 200 tablet, Rfl: 3   Multiple Vitamin (MULTIVITAMIN WITH MINERALS) TABS tablet, Take 1 tablet by mouth daily., Disp: , Rfl:    nystatin (MYCOSTATIN) 100000 UNIT/ML suspension, Take 5 mLs by mouth 4 (four) times daily., Disp: , Rfl:    Ondansetron HCl (ZOFRAN PO), 4 mg. Take 1 tablet on the tongue and allow to dissolve, Disp: , Rfl:    pantoprazole (PROTONIX) 40 MG tablet, Take 40 mg by mouth daily., Disp: , Rfl:    phenol (CHLORASEPTIC) 1.4 % LIQD, Use as directed 1 spray in the mouth or throat as needed for throat irritation / pain., Disp: , Rfl:    potassium chloride SA (KLOR-CON M) 20 MEQ tablet, TAKE 2 TABLETS THREE TIMES DAILY. TAKE AN EXTRA 2 TABLETS ON DAYS YOU TAKE METOLAZONE, Disp: 572 tablet, Rfl: 3   risperiDONE (RISPERDAL) 0.5 MG tablet, Take 1 tablet (0.5 mg total) by mouth 2 (two) times daily at 8 am and 4 pm. (Patient not taking: Reported on 06/29/2023), Disp: 60 tablet, Rfl: 2   simvastatin (ZOCOR) 10 MG tablet, TAKE 1 TABLET EVERY EVENING, Disp: 90 tablet, Rfl: 3   spironolactone  (ALDACTONE) 25 MG tablet, TAKE 1 TABLET (25 MG TOTAL) BY MOUTH EVERY EVENING., Disp: 90 tablet, Rfl: 3   tirzepatide (MOUNJARO) 2.5 MG/0.5ML Pen, Inject 2.5 mg into the skin once a week., Disp: 2 mL, Rfl: 0   tizanidine (ZANAFLEX) 2 MG capsule, Take 2 mg by mouth daily., Disp: , Rfl:    torsemide (DEMADEX) 20 MG tablet, Take 5 tablets (100 mg total) by mouth 2 (two) times daily., Disp: 900 tablet, Rfl: 2 Allergies  Allergen Reactions   Ceftin Anaphylaxis    Face and throat swell    Cefuroxime Axetil Anaphylaxis    Face and throat swell   Geodon [Ziprasidone Hcl] Hives   Lisinopril Other (See Comments)    angioedema   Cefuroxime     Other reaction(s): anaphylaxis   Sulfacetamide Sodium-Sulfur     Other reaction(s): itch   Allopurinol Nausea Only and Other (See Comments)    weakness   Ativan [Lorazepam] Itching   Sulfa Antibiotics Itching   Valium [Diazepam] Other (See Comments)    Patient states that diazepam doesn't relax, it has the opposite effect.     Social History   Socioeconomic History   Marital status: Married    Spouse name: Not on file   Number of children: 2   Years of education: 20   Highest education level: Master's degree (e.g., MA, MS, MEng, MEd, MSW, MBA)  Occupational History   Occupation: retired  Tobacco Use   Smoking status: Some Days    Current packs/day: 0.00    Average packs/day: 0.1 packs/day for 26.0 years (2.6 ttl pk-yrs)    Types: Cigarettes    Start date: 03/24/1995    Last attempt to quit: 03/23/2021    Years since quitting: 2.3   Smokeless tobacco: Never  Vaping Use   Vaping status: Never Used  Substance and Sexual Activity   Alcohol use: No   Drug use: No   Sexual activity: Yes  Other Topics Concern   Not on file  Social History Narrative   Tobacco Use Cigarettes: Former Smoker, Quit in 2008   No Alcohol   No recreational drug use   Diet: Regular/Low Carb   Exercise: None   Occupation: disabled   Education: Company secretary,  masters  Children: 2   Firearms: No   Seat Belt Use: Always   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      Social Drivers of Health   Financial Resource Strain: Not on file  Food Insecurity: No Food Insecurity (03/31/2023)   Hunger Vital Sign    Worried About Running Out of Food in the Last Year: Never true    Ran Out of Food in the Last Year: Never true  Transportation Needs: No Transportation Needs (03/31/2023)   PRAPARE - Administrator, Civil Service (Medical): No    Lack of Transportation (Non-Medical): No  Physical Activity: Not on file  Stress: No Stress Concern Present (08/11/2022)   [name] of Occupational Health - Occupational Stress Questionnaire    Feeling of Stress : Only a little  Social Connections: Not on file  Intimate Partner Violence: Not At Risk (07/27/2022)   Humiliation, Afraid, Rape, and Kick questionnaire    Fear of Current or Ex-Partner: No    Emotionally Abused: No    Physically Abused: No    Sexually Abused: No    Physical Exam      Future Appointments  Date Time Provider Department Center  07/14/2023 11:30 AM [facility] PA/NP [facility] None  07/22/2023  3:30 PM [name] D, LCSW [facility] None  09/09/2023  3:20 PM Arfeen, Phillips Grout, MD [facility] None  09/13/2023  7:10 AM [facility] DEVICE REMOTES [facility]  09/22/2023  1:45 PM Freddie Breech, DPM [facility]  01/11/2024  2:30 PM Patel, Roxana Hires K, DO [facility] None

## 2023-07-14 ENCOUNTER — Encounter (HOSPITAL_COMMUNITY): Payer: Self-pay

## 2023-07-14 ENCOUNTER — Telehealth (HOSPITAL_COMMUNITY): Payer: Self-pay

## 2023-07-14 ENCOUNTER — Other Ambulatory Visit (HOSPITAL_COMMUNITY): Payer: Self-pay

## 2023-07-14 ENCOUNTER — Ambulatory Visit (HOSPITAL_COMMUNITY)
Admission: RE | Admit: 2023-07-14 | Discharge: 2023-07-14 | Disposition: A | Discharge status: Home / Self Care | Payer: Medicare HMO | Source: Ambulatory Visit | Attending: Family Medicine

## 2023-07-14 VITALS — BP 122/68 | HR 78 | Wt 226.0 lb

## 2023-07-14 DIAGNOSIS — F32A Depression, unspecified: Secondary | ICD-10-CM | POA: Insufficient documentation

## 2023-07-14 DIAGNOSIS — M10071 Idiopathic gout, right ankle and foot: Secondary | ICD-10-CM | POA: Insufficient documentation

## 2023-07-14 DIAGNOSIS — I878 Other specified disorders of veins: Secondary | ICD-10-CM | POA: Diagnosis not present

## 2023-07-14 DIAGNOSIS — G932 Benign intracranial hypertension: Secondary | ICD-10-CM | POA: Diagnosis not present

## 2023-07-14 DIAGNOSIS — N183 Chronic kidney disease, stage 3 unspecified: Secondary | ICD-10-CM | POA: Diagnosis not present

## 2023-07-14 DIAGNOSIS — Z6841 Body Mass Index (BMI) 40.0 and over, adult: Secondary | ICD-10-CM | POA: Insufficient documentation

## 2023-07-14 DIAGNOSIS — E1122 Type 2 diabetes mellitus with diabetic chronic kidney disease: Secondary | ICD-10-CM | POA: Diagnosis not present

## 2023-07-14 DIAGNOSIS — I5022 Chronic systolic (congestive) heart failure: Secondary | ICD-10-CM

## 2023-07-14 DIAGNOSIS — I130 Hypertensive heart and chronic kidney disease with heart failure and stage 1 through stage 4 chronic kidney disease, or unspecified chronic kidney disease: Secondary | ICD-10-CM | POA: Insufficient documentation

## 2023-07-14 DIAGNOSIS — I10 Essential (primary) hypertension: Secondary | ICD-10-CM

## 2023-07-14 DIAGNOSIS — I951 Orthostatic hypotension: Secondary | ICD-10-CM | POA: Diagnosis not present

## 2023-07-14 DIAGNOSIS — J9610 Chronic respiratory failure, unspecified whether with hypoxia or hypercapnia: Secondary | ICD-10-CM | POA: Insufficient documentation

## 2023-07-14 DIAGNOSIS — Z9181 History of falling: Secondary | ICD-10-CM

## 2023-07-14 DIAGNOSIS — Z7985 Long-term (current) use of injectable non-insulin antidiabetic drugs: Secondary | ICD-10-CM | POA: Diagnosis not present

## 2023-07-14 DIAGNOSIS — M1040 Other secondary gout, unspecified site: Secondary | ICD-10-CM

## 2023-07-14 DIAGNOSIS — N1832 Chronic kidney disease, stage 3b: Secondary | ICD-10-CM | POA: Diagnosis not present

## 2023-07-14 DIAGNOSIS — Z87730 Personal history of (corrected) cleft lip and palate: Secondary | ICD-10-CM | POA: Diagnosis not present

## 2023-07-14 DIAGNOSIS — I872 Venous insufficiency (chronic) (peripheral): Secondary | ICD-10-CM

## 2023-07-14 DIAGNOSIS — F03A4 Unspecified dementia, mild, with anxiety: Secondary | ICD-10-CM | POA: Insufficient documentation

## 2023-07-14 DIAGNOSIS — Z79899 Other long term (current) drug therapy: Secondary | ICD-10-CM | POA: Diagnosis not present

## 2023-07-14 LAB — CBC
HCT: 38 % (ref 36.0–46.0)
Hemoglobin: 12.7 g/dL (ref 12.0–15.0)
MCH: 34.5 pg — ABNORMAL HIGH (ref 26.0–34.0)
MCHC: 33.4 g/dL (ref 30.0–36.0)
MCV: 103.3 fL — ABNORMAL HIGH (ref 80.0–100.0)
Platelets: 302 10*3/uL (ref 150–400)
RBC: 3.68 MIL/uL — ABNORMAL LOW (ref 3.87–5.11)
RDW: 13.4 % (ref 11.5–15.5)
WBC: 7.5 10*3/uL (ref 4.0–10.5)
nRBC: 0 % (ref 0.0–0.2)

## 2023-07-14 LAB — FERRITIN: Ferritin: 42 ng/mL (ref 11–307)

## 2023-07-14 LAB — BASIC METABOLIC PANEL
Anion gap: 13 (ref 5–15)
BUN: 47 mg/dL — ABNORMAL HIGH (ref 8–23)
CO2: 26 mmol/L (ref 22–32)
Calcium: 9.3 mg/dL (ref 8.9–10.3)
Chloride: 95 mmol/L — ABNORMAL LOW (ref 98–111)
Creatinine, Ser: 1.91 mg/dL — ABNORMAL HIGH (ref 0.44–1.00)
GFR, Estimated: 28 mL/min — ABNORMAL LOW (ref >60)
Glucose, Bld: 93 mg/dL (ref 70–99)
Potassium: 3.3 mmol/L — ABNORMAL LOW (ref 3.5–5.1)
Sodium: 134 mmol/L — ABNORMAL LOW (ref 135–145)

## 2023-07-14 LAB — IRON AND TIBC
Iron: 69 ug/dL (ref 28–170)
Saturation Ratios: 14 % (ref 10.4–31.8)
TIBC: 491 ug/dL — ABNORMAL HIGH (ref 250–450)
UIBC: 422 ug/dL

## 2023-07-14 LAB — BRAIN NATRIURETIC PEPTIDE: B Natriuretic Peptide: 71.5 pg/mL (ref 0.0–100.0)

## 2023-07-14 LAB — URIC ACID: Uric Acid, Serum: 9.7 mg/dL — ABNORMAL HIGH (ref 2.5–7.1)

## 2023-07-14 MED ORDER — ALLOPURINOL 100 MG PO TABS
50.0000 mg | ORAL_TABLET | Freq: Every day | ORAL | 1 refills | Status: DC
Start: 2023-07-14 — End: 2023-10-07

## 2023-07-14 MED ORDER — POTASSIUM CHLORIDE CRYS ER 20 MEQ PO TBCR: 60.0000 meq | EXTENDED_RELEASE_TABLET | Freq: Two times a day (BID) | ORAL | 3 refills | Status: AC

## 2023-07-14 NOTE — Telephone Encounter (Addendum)
Pt aware, agreeable, and verbalized understanding  Rx updated, labs ordered and scheduled, iron orders are in PA sent to [facility]   ----- Message from Jacklynn Ganong sent at 07/14/2023 12:34 PM EST ----- K is low. Please increase KCL to 60 tid (currently on 40 tid). Repeat BMET in 10-14 days   Tsat and ferritin are low. Please arrange iron infusion if she is agreeable, this may help with fatigue.  Uric acid level elevated. I start allopurinol today. Will forward labs to PCP

## 2023-07-14 NOTE — Progress Notes (Signed)
[facility]   Patient ID: Rhonda [name], female   DOB: 05-14-1951, 73 y.o.   MRN: [id number]  Primary Cardiologist: Dr Mayford Knife  General Surgeon: Dr Dwain Sarna  Orthopedic: Dr Charlann Boxer  PCP: Shon Hale, MD HF Cardiologist: Dr. Gala Romney  HPI: Rhonda [name] is a 73 y.o. 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 She has had sleep study with no evidence of sleep apnea in remote past.  She has a Medtronic CRT-D device. Echo in 7/18 showed recovery of EF to 55-60%. PYP 09/02/17 negative for TTR (Grade 0-1, H/CCL 1.2). SPEP with no M-spike.   Has been followed by Nita Sickle in Neurology. Has been on Florinef and midodrine for orthostatic hypotension (failed mestinon).   Seen by Dr. Marisue Humble and SCr slightly worse, so Florinef stopped. Echo 1/23 EF 60-65%, RV ok  Follow up 2/23, SCr trending up and metolazone changed to every other Tuesday.   Admitted 5/23 with AKI. Resuscitated with IVF, Florinef and midodrine restarted. SCr stabilized at 1.6-1.8.  Follow up 7/24, volume mildly up and BP elevated. Midodrine decreased to 5 bid, repeat echo arranged.  Echo 12/30/22 EF 55-60% G1DD RV normal.   Today she returns for HF follow up with her husband and [name], paramedic. Overall feeling fine. She has fatigue walking up steps. Wheezing and URI symptoms a few weeks ago, no fever. Occasional cough in AM. Having gout flare in R foot, and legs swelling. No falls, occasional lightheadedness. Requesting suppressive meds for gout. Denies palpitations, abnormal bleeding, CP or PND/Orthopnea. Appetite ok. No fever or chills. Weight at home 223 pounds.  Taking all medications. Just started [facility].   Cardiac Studies  - Echo (5/14): EF 35% RV ok  - Echo (11/14): EF 20-25%, LV moderately dilated - Echo (5/15): EF 45-50% - Echo (7/18): EF 55-60%, normal RV size and systolic function, PASP 34 mmHg - PYP (4/19): negative TTR - Echo (1/21): EF 60-65% grade II DD. RV ok - Echo (1/23): EF 60-65%, RV ok - Echo (8/24): EF 55-60%, G1DD, RV normal  Review of systems complete and found to be negative unless listed in HPI.   Past Medical History:  Diagnosis Date   Abnormal gait 06/11/2020   Acute left ankle pain 10/11/2021   AKI (acute kidney injury) 10/08/2021   Allergic rhinitis 06/11/2020   Anemia    Arthritis    Right knee   Asthma 06/11/2020   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   Back pain    Disk problem   Benign intracranial hypertension 06/11/2020   Bilateral leg weakness 08/01/2019   Biventricular ICD (implantable cardioverter-defibrillator) in place 08/04/2013   Body mass index (BMI) 45.0-49.9, adult 06/11/2020   Bowel incontinence 06/11/2020   Cardiomyopathy    Cholelithiasis 06/11/2020   Chronic respiratory failure 09/14/2013   Chronic sinusitis 06/11/2020   Chronic systolic heart failure 10/27/2012   a) NICM b) ECHO (03/2013) EF 20-25% c) ECHO (09/2013) EF 45-50%, grade I DD   CKD (chronic kidney disease) stage 3, GFR 30-59 ml/min 11/08/2018   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   Complication of anesthesia    History of low blood pressure after surgery; attributed to lying flat   Daytime  somnolence 06/11/2020   Decreased estrogen level 08/01/2021   Diabetic neuropathy    Diarrhea of presumed infectious origin 06/11/2020   Edema 06/11/2020   Elevated troponin 12/02/2021   Endometrial polyp 01/20/2012   Exertional shortness of breath    Generalized anxiety disorder    GERD (gastroesophageal reflux disease)    Gout 06/11/2020   Heart murmur    History of colonic polyps 06/11/2020   History of fall 06/11/2020   Hyperlipidemia     Hyperparathyroidism 08/01/2021   Hypertension    Hypokalemia 11/08/2018   Hyponatremia 10/08/2021   Hypothyroidism    Insomnia 06/11/2020   Intractable nausea and vomiting 10/08/2021   Irritable bowel syndrome 01/04/2020   Left bundle branch block    s/p CRT-D (04/2013)   Lumbago without sciatica 04/30/2021   Lumbar spondylosis with myelopathy 06/11/2020   Macrocytosis 06/11/2020   Major depressive disorder    Malignant tumor of breast 03/26/2011   Left; completed chemotherapy and radiation treatments   Migraines    Mild dementia 04/20/2023   On home oxygen therapy    "2L suppose to be q night" (05/03/2013)   Orthostatic hypotension 07/28/2017   Spinal stenosis of lumbar region 01/03/2020   Spondylolisthesis 08/01/2021   SVD (spontaneous vaginal delivery)    x 2   Type II diabetes mellitus 06/15/2019   Varicose veins of bilateral lower extremities with other complications 08/01/2021   Vitamin D deficiency 03/26/2011   Current Outpatient Medications  Medication Sig Dispense Refill   acetaminophen (TYLENOL) 500 MG tablet Take 500 mg by mouth every 6 (six) hours as needed.     acetaZOLAMIDE (DIAMOX) 125 MG tablet Take 1 tablet (125 mg total) by mouth daily. 90 tablet 3   azelastine (ASTELIN) 0.1 % nasal spray Place 2 sprays into both nostrils 2 (two) times daily. Use in each nostril as directed 30 mL 12   buPROPion (WELLBUTRIN XL) 150 MG 24 hr tablet Take 1 tablet (150 mg total) by mouth daily. 30 tablet 2   carvedilol (COREG) 3.125 MG tablet TAKE 1 TABLET (3.125 MG) BY MOUTH TWICE DAILY WITH MEALS 180 tablet 3   citalopram (CELEXA) 20 MG tablet Take 1 tablet (20 mg total) by mouth daily. 30 tablet 2   clonazePAM (KLONOPIN) 0.5 MG tablet Take 1 tablet (0.5 mg total) by mouth 2 (two) times daily. 60 tablet 2   cyanocobalamin (VITAMIN B12) 1000 MCG tablet Take 1,000 mcg by mouth daily.     desloratadine (CLARINEX) 5 MG tablet Take 1 tablet (5 mg total) by mouth daily. 90 tablet 3    dicyclomine (BENTYL) 20 MG tablet Take 20 mg by mouth 3 (three) times daily before meals.     famotidine (PEPCID) 20 MG tablet Take 20 mg by mouth 2 (two) times daily.     fluticasone (FLONASE) 50 MCG/ACT nasal spray Place 1 spray into both nostrils daily.     gabapentin (NEURONTIN) 100 MG capsule TAKE 1 CAPSULE (100 MG TOTAL) BY MOUTH 2 (TWO) TIMES DAILY. 180 capsule 0   levothyroxine (SYNTHROID) 88 MCG tablet TAKE 1 TABLET(88 MCG) BY MOUTH DAILY BEFORE BREAKFAST 90 tablet 3   memantine (NAMENDA) 10 MG tablet Take half tablet twice daily x 1 week, then increase to half tablet in the morning and 1 tablet at bedtime x 1 week, then 1 tablet BID 60 tablet 11   metolazone (ZAROXOLYN) 2.5 MG tablet TAKE 1 TABLET BY MOUTH ONCE A WEEK 4 tablet 1   midodrine (PROAMATINE) 5  MG tablet Take 1 tablet (5 mg total) by mouth 3 (three) times daily. (Patient taking differently: Take 10 mg by mouth 3 (three) times daily.) 200 tablet 3   Multiple Vitamin (MULTIVITAMIN WITH MINERALS) TABS tablet Take 1 tablet by mouth daily.     nystatin (MYCOSTATIN) 100000 UNIT/ML suspension Take 5 mLs by mouth 4 (four) times daily.     Ondansetron HCl (ZOFRAN PO) 4 mg. Take 1 tablet on the tongue and allow to dissolve     phenol (CHLORASEPTIC) 1.4 % LIQD Use as directed 1 spray in the mouth or throat as needed for throat irritation / pain.     potassium chloride SA (KLOR-CON M) 20 MEQ tablet TAKE 2 TABLETS THREE TIMES DAILY. TAKE AN EXTRA 2 TABLETS ON DAYS YOU TAKE METOLAZONE 572 tablet 3   risperiDONE (RISPERDAL) 0.5 MG tablet Take 1 tablet (0.5 mg total) by mouth 2 (two) times daily at 8 am and 4 pm. 60 tablet 2   simvastatin (ZOCOR) 10 MG tablet TAKE 1 TABLET EVERY EVENING 90 tablet 3   spironolactone (ALDACTONE) 25 MG tablet TAKE 1 TABLET (25 MG TOTAL) BY MOUTH EVERY EVENING. 90 tablet 3   tirzepatide (MOUNJARO) 2.5 MG/0.5ML Pen Inject 2.5 mg into the skin once a week. 2 mL 0   torsemide (DEMADEX) 20 MG tablet Take 5 tablets (100  mg total) by mouth 2 (two) times daily. 900 tablet 2   No current facility-administered medications for this encounter.   BP 122/68   Pulse 78   Wt 102.5 kg (226 lb)   SpO2 98%   BMI 44.14 kg/m   Wt Readings from Last 3 Encounters:  07/14/23 102.5 kg (226 lb)  06/30/23 101.2 kg (223 lb)  06/29/23 101.2 kg (223 lb)    PHYSICAL EXAM: General:  NAD. No resp difficulty, walked into clinic HEENT: Normal Neck: Supple. No JVD. Cor: Regular rate & rhythm. No rubs, gallops or murmurs. Lungs: Clear Abdomen: Soft, obese, nontender, nondistended.  Extremities: No cyanosis, clubbing, rash, edema Neuro: Alert & oriented x 3, moves all 4 extremities w/o difficulty. Affect pleasant. + dysarthria   Device interrogation (personally reviewed): OptiVol and thoracic impedence stable, 0.1 hr/day activity, <0.1 hr/day AT/AF, 98.9% VP  ASSESSMENT & PLAN:  1) Chronic systolic HF with recovered EF/nonischemic cardiomyopathy:  - NICM s/p Medtronic CRT-D, ? Viral myocarditis. Cardiomyopathy dates from 24.  - Echo (5/15): EF 45-50% - Echo (2020): EF up to 55-60%.  - PYP (4/19) negative for TTR (Grade 0-1, H/CCL 1.2). SPEP with no M-spike.  - Echo (1/21): EF 60-65% grade II DD. RV ok.  - Echo (1/23): EF 60-65% RV normal.  - Echo 12/30/22: EF 55-60% G1DD RV normal. Personally reviewed - Stable NYHA III, functional status limited by obesity, deconditioning and depression. Volume OK today on exam and OptiVol - Continue torsemide 100 mg bid. - Continue metolazone 2.5 mg/40 KCL every Tuesday - Continue carvedilol 3.125 mg bid. - Continue spironolactone 25 mg daily.  - Continue midodrine 10 mg tid. - No SGT2i with chronic yeast infections. - C/w paramedicine  - Labs today.  2) Obesity - Body mass index is 44.14 kg/m. - Encouraged PREP program. - Now on GLP1RA  3) HTN - BP improved  - Continue midorine  4) Orthostatic Hypotension.  - Neurology managing.  - She is on diamox for increased  ICP. - Continue midodrine as above  - No change  5) Chronic Venous stasis - Encouraged her to use compression stockings   -  No change  6) Falls  - No recent falls. - Follows with Neurology.   7) CKD 3 - Baseline SCr 1.4-1.6 - Follows with Dr. Marisue Humble in Nephrology - Labs today  8. Gout - Check uric acid - Start allopurinol 50 mg daily (dosing discussed with Pharmd) - Defer to PCP for on-going management  Follow up in 2 months with APP.  Jacklynn Ganong, FNP  10:23 AM

## 2023-07-14 NOTE — Progress Notes (Signed)
EPIC Encounter for ICM Monitoring  Patient Name: IONA STAY is a 73 y.o. female Date: 07/14/2023 Primary Care Physican: Shon Hale, MD Primary Cardiologist: Bensimhon Electrophysiologist: Rae Roam Pacing: 98.8%   06/01/2022 Weight: 193 lbs  07/08/2022 Weight: 183 lbs 08/10/2022 Weight: 185 lbs 11/25/2022 Weight: 180 lbs 12/30/2022 Weight: 184 lbs 04/26/2023 Weight: 214 lbs Per EMT note 07/14/2023 Weight: 226 lbs         Transmission reviewed.  Pt seen in HF clinic 2/19.               Optivol thoracic impedance suggesting normal fluid levels since 1/27.     Prescribed:  [name] Setting with paramedicine program assists with meds. Torsemide 20 mg 5 tablets (100 mg total) by mouth twice a day  Potassium 20 meq 3 tablets (60 mEq total) by mouth 3 (three) times daily.  Take extra 2 tablets on days you take Metolazone.   Metolazone 2.5mg  1 tablet by mouth once a week. Spironolactone 25 mg take 1 tablet by mouth every evening.   Labs: 07/28/2023 BMET Scheduled at HF Clinic 07/14/2023 Creatinine 1.91, BUN 47, Potassium 3.3, Sodium 134, GFR 28 06/09/2023 Creatinine 1.87, BUN 42, Potassium 3.5, Sodium 136, GFR 29 05/27/2023 Creatinine 1.87, BUN 42, Potassium 3.5, Sodium 136, GFR 29 05/13/2023 Creatinine 1.95, BUN 43, Potassium 3.6, Sodium 134, GFR 27 05/06/2023 Creatinine 2.00, BUN 33, Potassium 4.3, Sodium 140, GFR 26 03/04/2023 Creatinine 1.62, BUN 40, Potassium 4.4, Sodium 139, GFR 34 A complete set of results can be found in Results Review.   Recommendations:  Any recommendations will be given at 2/19 HF clinic OV visit.    Follow-up plan: ICM clinic phone appointment on 08/16/2023.   91 day device clinic remote transmission 09/13/2023.     EP/Cardiology Office Visits:  10/13/2023 with HF clinic.  Recall 03/20/2023 with Dr Ladona Ridgel.     Copy of ICM check sent to Dr. Ladona Ridgel.   3 month ICM trend: 07/14/2023.    12-14 Month ICM trend:     Karie Soda, RN 07/14/2023 2:17  PM

## 2023-07-14 NOTE — Patient Instructions (Signed)
Medication Changes:  START: ALLOPURINOL 50MG  DAILY   Lab Work:  Labs done today, your results will be available in MyChart, we will contact you for abnormal readings.  Follow-Up in: 2 MONTHS AS SCHEDULED   At the [facility], you and your health needs are our priority. We have a designated team specialized in the treatment of Heart Failure. This Care Team includes your primary Heart Failure Specialized Cardiologist (physician), Advanced Practice Providers (APPs- Physician Assistants and Nurse Practitioners), and Pharmacist who all work together to provide you with the care you need, when you need it.   You may see any of the following providers on your designated Care Team at your next follow up:  Dr. Arvilla Meres Dr. Marca Ancona Dr. Dorthula Nettles Dr. Theresia Bough Tonye Becket, NP Robbie Lis, [place] [facility] [place], [place] Brynda Peon, NP [place] Lee, NP Karle Plumber, PharmD   Please be sure to bring in all your medications bottles to every appointment.   Need to Contact us:  If you have any questions or concerns before your next appointment please send [place] a message through [place] or call our office at [phone].    TO LEAVE A MESSAGE FOR THE NURSE SELECT OPTION 2, PLEASE LEAVE A MESSAGE INCLUDING: YOUR NAME DATE OF BIRTH CALL BACK NUMBER REASON FOR CALL**this is important as we prioritize the call backs  YOU WILL RECEIVE A CALL BACK THE SAME DAY AS LONG AS YOU CALL BEFORE 4:00 PM

## 2023-07-14 NOTE — Progress Notes (Signed)
Paramedicine Encounter  Patient ID: Rhonda [name], female, DOB: 1950-11-17, 73 y.o.,  MRN: [id number]  Met patient in clinic today with Prince Rome NP.  Weight @ clinic-226lbs B/P-122/68 P-78 SP02-98%  Med changes: ADD ALLUPURINOL 50MG  daily.   Arrived for clinic visit for [facility] who was seen by Shanda Bumps today in the clinic. She continues to report the right foot pain for gout. Jessica prescribed Allupurinol 50mg  daily. No other med changes. Labs today. She is taking Monjauro on Thursday she will receive her 3rd shot tomorrow. I plan to follow up on Monday in the home- she will be seen in clinic in May. I will call [facility] and update Dr. Chanetta Marshall. Clinic visit complete.   [name], EMT-Paramedic [phone] 07/14/2023

## 2023-07-14 NOTE — Telephone Encounter (Signed)
Spoke to Ed and confirmed Potassium was added to [employer] pill box totaling TID. Same was added. I will follow up in one week.    [name], EMT-Paramedic [phone] 07/14/2023

## 2023-07-20 ENCOUNTER — Other Ambulatory Visit (HOSPITAL_COMMUNITY): Payer: Self-pay

## 2023-07-20 NOTE — Progress Notes (Signed)
 Paramedicine Encounter    Patient ID: Rhonda [name], female    DOB: [birth year], 73 y.o.   MRN: [id number]   Arrived for a medication reconcile only today- we will complete a full paramedicine visit next week.     Patient Care Team: Shon Hale, MD as PCP - General (Family Medicine) Burna Sis, LCSW as Social Worker (Licensed Clinical Social Worker) [name], Noberto Retort, DO as Consulting Physician (Neurology) [name], Paramedic as Paramedic Chanetta Marshall, Meridee Score, MD as Attending Physician (Family Medicine) Clinton Gallant, RN as [facility] Care Management Bensimhon, Bevelyn Buckles, MD as Consulting Physician (Cardiology) Cleotis Nipper, MD as Consulting Physician (Psychiatry) Candelaria Stagers, DPM as Consulting Physician (Podiatry)  Patient Active Problem List   Diagnosis Date Noted   Mild dementia 04/20/2023   Diabetic neuropathy    Elevated troponin 12/02/2021   Acute left ankle pain 10/11/2021   Hyponatremia 10/08/2021   Decreased estrogen level 08/01/2021   Hyperparathyroidism 08/01/2021   Spondylolisthesis 08/01/2021   Varicose veins of bilateral lower extremities with other complications 08/01/2021   Lumbago without sciatica 04/30/2021   Abnormal gait 06/11/2020   Allergic rhinitis 06/11/2020   Asthma 06/11/2020   Benign intracranial hypertension 06/11/2020   Body mass index (BMI) 45.0-49.9, adult 06/11/2020   Bowel incontinence 06/11/2020   Cholelithiasis 06/11/2020   Chronic sinusitis 06/11/2020   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   Daytime somnolence 06/11/2020   Edema 06/11/2020   Family history of malignant neoplasm of gastrointestinal tract 06/11/2020   Gout 06/11/2020   Insomnia 06/11/2020   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   Lumbar spondylosis with myelopathy 06/11/2020   Macrocytosis 06/11/2020   History of colonic polyps 06/11/2020   Repeated falls 06/11/2020   Irritable bowel syndrome 01/04/2020   Spinal stenosis of lumbar region  01/03/2020   Bilateral leg weakness 08/01/2019   Type II diabetes mellitus 06/15/2019   Hypokalemia 11/08/2018   CKD (chronic kidney disease) stage 3, GFR 30-59 ml/min 11/08/2018   Orthostatic hypotension 07/28/2017   On home oxygen therapy    Migraines    Left bundle branch block    Hypothyroidism    Hypertension    Hyperlipidemia    Heart murmur    GERD (gastroesophageal reflux disease)    Exertional shortness of breath    Major depressive disorder    Back pain    Arthritis    Generalized anxiety disorder    Anemia    Chronic respiratory failure 09/14/2013   Biventricular ICD (implantable cardioverter-defibrillator) in place 08/04/2013   Chronic systolic heart failure 10/27/2012   Endometrial polyp 01/20/2012   Malignant tumor of breast 03/26/2011   Vitamin D deficiency 03/26/2011    Current Outpatient Medications:    acetaminophen (TYLENOL) 500 MG tablet, Take 500 mg by mouth every 6 (six) hours as needed., Disp: , Rfl:    acetaZOLAMIDE (DIAMOX) 125 MG tablet, Take 1 tablet (125 mg total) by mouth daily., Disp: 90 tablet, Rfl: 3   allopurinol (ZYLOPRIM) 100 MG tablet, Take 0.5 tablets (50 mg total) by mouth daily., Disp: 45 tablet, Rfl: 1   azelastine (ASTELIN) 0.1 % nasal spray, Place 2 sprays into both nostrils 2 (two) times daily. Use in each nostril as directed, Disp: 30 mL, Rfl: 12   buPROPion (WELLBUTRIN XL) 150 MG 24 hr tablet, Take 1 tablet (150 mg total) by mouth daily., Disp: 30 tablet, Rfl: 2   carvedilol (COREG) 3.125 MG tablet, TAKE 1 TABLET (3.125 MG) BY MOUTH TWICE DAILY WITH  MEALS, Disp: 180 tablet, Rfl: 3   citalopram (CELEXA) 20 MG tablet, Take 1 tablet (20 mg total) by mouth daily., Disp: 30 tablet, Rfl: 2   clonazePAM (KLONOPIN) 0.5 MG tablet, Take 1 tablet (0.5 mg total) by mouth 2 (two) times daily., Disp: 60 tablet, Rfl: 2   cyanocobalamin (VITAMIN B12) 1000 MCG tablet, Take 1,000 mcg by mouth daily., Disp: , Rfl:    desloratadine (CLARINEX) 5 MG  tablet, Take 1 tablet (5 mg total) by mouth daily., Disp: 90 tablet, Rfl: 3   dicyclomine (BENTYL) 20 MG tablet, Take 20 mg by mouth 3 (three) times daily before meals., Disp: , Rfl:    famotidine (PEPCID) 20 MG tablet, Take 20 mg by mouth 2 (two) times daily., Disp: , Rfl:    fluticasone (FLONASE) 50 MCG/ACT nasal spray, Place 1 spray into both nostrils daily., Disp: , Rfl:    gabapentin (NEURONTIN) 100 MG capsule, TAKE 1 CAPSULE (100 MG TOTAL) BY MOUTH 2 (TWO) TIMES DAILY., Disp: 180 capsule, Rfl: 0   levothyroxine (SYNTHROID) 88 MCG tablet, TAKE 1 TABLET(88 MCG) BY MOUTH DAILY BEFORE BREAKFAST, Disp: 90 tablet, Rfl: 3   memantine (NAMENDA) 10 MG tablet, Take half tablet twice daily x 1 week, then increase to half tablet in the morning and 1 tablet at bedtime x 1 week, then 1 tablet BID, Disp: 60 tablet, Rfl: 11   metolazone (ZAROXOLYN) 2.5 MG tablet, TAKE 1 TABLET BY MOUTH ONCE A WEEK, Disp: 4 tablet, Rfl: 1   midodrine (PROAMATINE) 5 MG tablet, Take 1 tablet (5 mg total) by mouth 3 (three) times daily. (Patient taking differently: Take 10 mg by mouth 3 (three) times daily.), Disp: 200 tablet, Rfl: 3   Multiple Vitamin (MULTIVITAMIN WITH MINERALS) TABS tablet, Take 1 tablet by mouth daily., Disp: , Rfl:    nystatin (MYCOSTATIN) 100000 UNIT/ML suspension, Take 5 mLs by mouth 4 (four) times daily., Disp: , Rfl:    Ondansetron HCl (ZOFRAN PO), 4 mg. Take 1 tablet on the tongue and allow to dissolve, Disp: , Rfl:    phenol (CHLORASEPTIC) 1.4 % LIQD, Use as directed 1 spray in the mouth or throat as needed for throat irritation / pain., Disp: , Rfl:    potassium chloride SA (KLOR-CON M) 20 MEQ tablet, Take 3 tablets (60 mEq total) by mouth 2 (two) times daily., Disp: 572 tablet, Rfl: 3   risperiDONE (RISPERDAL) 0.5 MG tablet, Take 1 tablet (0.5 mg total) by mouth 2 (two) times daily at 8 am and 4 pm., Disp: 60 tablet, Rfl: 2   simvastatin (ZOCOR) 10 MG tablet, TAKE 1 TABLET EVERY EVENING, Disp: 90  tablet, Rfl: 3   spironolactone (ALDACTONE) 25 MG tablet, TAKE 1 TABLET (25 MG TOTAL) BY MOUTH EVERY EVENING., Disp: 90 tablet, Rfl: 3   tirzepatide (MOUNJARO) 2.5 MG/0.5ML Pen, Inject 2.5 mg into the skin once a week., Disp: 2 mL, Rfl: 0   torsemide (DEMADEX) 20 MG tablet, Take 5 tablets (100 mg total) by mouth 2 (two) times daily., Disp: 900 tablet, Rfl: 2 Allergies  Allergen Reactions   Ceftin Anaphylaxis    Face and throat swell    Cefuroxime Axetil Anaphylaxis    Face and throat swell   Geodon [Ziprasidone Hcl] Hives   Lisinopril Other (See Comments)    angioedema   Cefuroxime     Other reaction(s): anaphylaxis   Sulfacetamide Sodium-Sulfur     Other reaction(s): itch   Allopurinol Nausea Only and Other (See Comments)  weakness   Ativan [Lorazepam] Itching   Sulfa Antibiotics Itching   Valium [Diazepam] Other (See Comments)    Patient states that diazepam doesn't relax, it has the opposite effect.     Social History   Socioeconomic History   Marital status: Married    Spouse name: Not on file   Number of children: 2   Years of education: 6   Highest education level: Master's degree (e.g., MA, MS, MEng, MEd, MSW, MBA)  Occupational History   Occupation: retired  Tobacco Use   Smoking status: Some Days    Current packs/day: 0.00    Average packs/day: 0.1 packs/day for 26.0 years (2.6 ttl pk-yrs)    Types: Cigarettes    Start date: 03/24/1995    Last attempt to quit: 03/23/2021    Years since quitting: 2.3   Smokeless tobacco: Never  Vaping Use   Vaping status: Never Used  Substance and Sexual Activity   Alcohol use: No   Drug use: No   Sexual activity: Yes  Other Topics Concern   Not on file  Social History Narrative   Tobacco Use Cigarettes: Former Smoker, Quit in 2008   No Alcohol   No recreational drug use   Diet: Regular/Low Carb   Exercise: None   Occupation: disabled   Education: Company secretary, masters   Children: 2   Firearms: No   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      Social Drivers of Health   Financial Resource Strain: Not on file  Food Insecurity: No Food Insecurity (03/31/2023)   Hunger Vital Sign    Worried About Running Out of Food in the Last Year: Never true    Ran Out of Food in the Last Year: Never true  Transportation Needs: No Transportation Needs (03/31/2023)   PRAPARE - Administrator, Civil Service (Medical): No    Lack of Transportation (Non-Medical): No  Physical Activity: Not on file  Stress: No Stress Concern Present (08/11/2022)   [name] of Occupational Health - Occupational Stress Questionnaire    Feeling of Stress : Only a little  Social Connections: Not on file  Intimate Partner Violence: Not At Risk (07/27/2022)   Humiliation, Afraid, Rape, and Kick questionnaire    Fear of Current or Ex-Partner: No    Emotionally Abused: No    Physically Abused: No    Sexually Abused: No    Physical Exam      Future Appointments  Date Time Provider Department Center  07/22/2023  3:30 PM Bridgett Larsson, LCSW [facility] None  07/26/2023 12:00 PM [facility] None  07/28/2023  1:45 PM [facility] LAB [facility] None  08/16/2023  7:25 AM [facility] DEVICE REMOTES [facility]  09/09/2023  3:20 PM Arfeen, Phillips Grout, MD [facility] None  09/13/2023  7:10 AM [facility] DEVICE REMOTES [facility]  09/22/2023  1:45 PM Freddie Breech, DPM [facility]  10/13/2023  1:30 PM [facility] PA/NP [facility] None  01/11/2024  2:30 PM Patel, Roxana Hires K, DO [facility] None     ACTION: Home visit completed

## 2023-07-21 DIAGNOSIS — N1832 Chronic kidney disease, stage 3b: Secondary | ICD-10-CM | POA: Diagnosis not present

## 2023-07-21 DIAGNOSIS — I951 Orthostatic hypotension: Secondary | ICD-10-CM | POA: Diagnosis not present

## 2023-07-21 DIAGNOSIS — I509 Heart failure, unspecified: Secondary | ICD-10-CM | POA: Diagnosis not present

## 2023-07-21 DIAGNOSIS — E1122 Type 2 diabetes mellitus with diabetic chronic kidney disease: Secondary | ICD-10-CM | POA: Diagnosis not present

## 2023-07-21 DIAGNOSIS — M1000 Idiopathic gout, unspecified site: Secondary | ICD-10-CM | POA: Diagnosis not present

## 2023-07-21 DIAGNOSIS — I130 Hypertensive heart and chronic kidney disease with heart failure and stage 1 through stage 4 chronic kidney disease, or unspecified chronic kidney disease: Secondary | ICD-10-CM | POA: Diagnosis not present

## 2023-07-21 DIAGNOSIS — Z79899 Other long term (current) drug therapy: Secondary | ICD-10-CM | POA: Diagnosis not present

## 2023-07-22 ENCOUNTER — Ambulatory Visit: Payer: Self-pay | Admitting: Licensed Clinical Social Worker

## 2023-07-22 ENCOUNTER — Other Ambulatory Visit (HOSPITAL_COMMUNITY): Payer: Self-pay | Admitting: *Deleted

## 2023-07-22 NOTE — Progress Notes (Signed)
 Remote ICD transmission.

## 2023-07-23 ENCOUNTER — Telehealth: Payer: Self-pay | Admitting: Pharmacist

## 2023-07-23 MED ORDER — MOUNJARO 5 MG/0.5ML ~~LOC~~ SOAJ: 5.0000 mg | SUBCUTANEOUS | 0 refills | Status: DC

## 2023-07-23 NOTE — Patient Instructions (Signed)
 Visit Information  Thank you for taking time to visit with me today. Please don't hesitate to contact me if I can be of assistance to you.   Following are the goals we discussed today:   Goals Addressed             This Visit's Progress    Obtain Supportive Resources   On track    Activities and task to complete in order to accomplish goals.   Keep all upcoming appointments discussed today Continue with compliance of taking medication prescribed by Doctor Implement healthy coping skills discussed to assist with management of symptoms          Our next appointment is by telephone on 4/10 at 3:30 PM  Please call the care guide team at [phone] if you need to cancel or reschedule your appointment.   If you are experiencing a Mental Health or Behavioral Health Crisis or need someone to talk to, please call the Suicide and Crisis Lifeline: 988 call 911   The patient verbalized understanding of instructions, educational materials, and care plan provided today and DECLINED offer to receive copy of patient instructions, educational materials, and care plan.   Windy Fast [facility]  [facility] Clinical Social Worker Direct Dial: [phone]  Fax: [phone] Website: [email or web address] 5:50 PM

## 2023-07-23 NOTE — Telephone Encounter (Signed)
 Patient's husband called reporting she has given all 4 shots of Mounjaro and is tolerating well.  Requesting next dose to be called into [facility].  I have sent Mounjaro 5 mg to [facility] pharmacy per her request.

## 2023-07-26 ENCOUNTER — Other Ambulatory Visit (HOSPITAL_COMMUNITY): Payer: Self-pay

## 2023-07-26 ENCOUNTER — Encounter (HOSPITAL_COMMUNITY): Payer: Medicare HMO

## 2023-07-26 NOTE — Progress Notes (Signed)
 Paramedicine Encounter    Patient ID: Rhonda [name], female    DOB: 04-21-1951, 73 y.o.   MRN: [id number]   Complaints- fatigue   Assessment- CAOX4, warm and dry seated in chair in bedroom, complaining of feeling tired, vitals at baseline, no lower leg edema, lungs clear, no pain.   Compliance with meds- missed Tuesdays doses last week. Three noon doses.   Pill box filled- for one week  -discussed with patient and husband about bubble packs-will wait until lab check to move over to same with anticipation of possible med  changes.   Refills needed- wellbutrin, Cleda Daub  Meds changes since last visit- none     Social changes- none    VISIT SUMMARY- Arrived for home visit for Rhonda [name] who reports to be feeling tired with no other complaints today. She denied shortness of breath, no lower leg swelling, no chest pain or dizziness. Vitals obtained and are as noted. Lungs clear, no lower leg edema noted, no JVD or abdominal distention. I reviewed medications and filled pill box for one week. Refills as noted will be called into [facility]. I wrote down upcoming appointments and plan to follow up if any med changes take place after labs. Home visit complete.   BP (!) 140/82   Pulse 70   Resp 16   Wt 220 lb (99.8 kg)   SpO2 97%   BMI 42.97 kg/m  Weight yesterday-- n/a Last visit weight--226lbs      ACTION: Home visit completed     Patient Care Team: Shon Hale, MD as PCP - General (Family Medicine) Burna Sis, LCSW as Social Worker (Licensed Clinical Social Worker) [name], Noberto Retort, DO as Consulting Physician (Neurology) [name], Paramedic as Paramedic Chanetta Marshall, Meridee Score, MD as Attending Physician (Family Medicine) Clinton Gallant, RN as [facility] Care Management Bensimhon, Bevelyn Buckles, MD as Consulting Physician (Cardiology) Lolly Mustache, Phillips Grout, MD as Consulting Physician (Psychiatry) Candelaria Stagers, DPM as Consulting Physician  (Podiatry)  Patient Active Problem List   Diagnosis Date Noted   Mild dementia 04/20/2023   Diabetic neuropathy    Elevated troponin 12/02/2021   Acute left ankle pain 10/11/2021   Hyponatremia 10/08/2021   Decreased estrogen level 08/01/2021   Hyperparathyroidism 08/01/2021   Spondylolisthesis 08/01/2021   Varicose veins of bilateral lower extremities with other complications 08/01/2021   Lumbago without sciatica 04/30/2021   Abnormal gait 06/11/2020   Allergic rhinitis 06/11/2020   Asthma 06/11/2020   Benign intracranial hypertension 06/11/2020   Body mass index (BMI) 45.0-49.9, adult 06/11/2020   Bowel incontinence 06/11/2020   Cholelithiasis 06/11/2020   Chronic sinusitis 06/11/2020   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   Daytime somnolence 06/11/2020   Edema 06/11/2020   Family history of malignant neoplasm of gastrointestinal tract 06/11/2020   Gout 06/11/2020   Insomnia 06/11/2020   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   Lumbar spondylosis with myelopathy 06/11/2020   Macrocytosis 06/11/2020   History of colonic polyps 06/11/2020   Repeated falls 06/11/2020   Irritable bowel syndrome 01/04/2020   Spinal stenosis of lumbar region 01/03/2020   Bilateral leg weakness 08/01/2019   Type II diabetes mellitus 06/15/2019   Hypokalemia 11/08/2018   CKD (chronic kidney disease) stage 3, GFR 30-59 ml/min 11/08/2018   Orthostatic hypotension 07/28/2017   On home oxygen therapy    Migraines    Left bundle branch block    Hypothyroidism    Hypertension    Hyperlipidemia    Heart murmur    GERD (  gastroesophageal reflux disease)    Exertional shortness of breath    Major depressive disorder    Back pain    Arthritis    Generalized anxiety disorder    Anemia    Chronic respiratory failure 09/14/2013   Biventricular ICD (implantable cardioverter-defibrillator) in place 08/04/2013   Chronic systolic heart failure 10/27/2012   Endometrial polyp 01/20/2012   Malignant tumor of breast  03/26/2011   Vitamin D deficiency 03/26/2011    Current Outpatient Medications:    acetaminophen (TYLENOL) 500 MG tablet, Take 500 mg by mouth every 6 (six) hours as needed., Disp: , Rfl:    acetaZOLAMIDE (DIAMOX) 125 MG tablet, Take 1 tablet (125 mg total) by mouth daily., Disp: 90 tablet, Rfl: 3   allopurinol (ZYLOPRIM) 100 MG tablet, Take 0.5 tablets (50 mg total) by mouth daily., Disp: 45 tablet, Rfl: 1   azelastine (ASTELIN) 0.1 % nasal spray, Place 2 sprays into both nostrils 2 (two) times daily. Use in each nostril as directed, Disp: 30 mL, Rfl: 12   buPROPion (WELLBUTRIN XL) 150 MG 24 hr tablet, Take 1 tablet (150 mg total) by mouth daily., Disp: 30 tablet, Rfl: 2   carvedilol (COREG) 3.125 MG tablet, TAKE 1 TABLET (3.125 MG) BY MOUTH TWICE DAILY WITH MEALS, Disp: 180 tablet, Rfl: 3   citalopram (CELEXA) 20 MG tablet, Take 1 tablet (20 mg total) by mouth daily., Disp: 30 tablet, Rfl: 2   clonazePAM (KLONOPIN) 0.5 MG tablet, Take 1 tablet (0.5 mg total) by mouth 2 (two) times daily., Disp: 60 tablet, Rfl: 2   cyanocobalamin (VITAMIN B12) 1000 MCG tablet, Take 1,000 mcg by mouth daily., Disp: , Rfl:    desloratadine (CLARINEX) 5 MG tablet, Take 1 tablet (5 mg total) by mouth daily., Disp: 90 tablet, Rfl: 3   dicyclomine (BENTYL) 20 MG tablet, Take 20 mg by mouth 3 (three) times daily before meals., Disp: , Rfl:    famotidine (PEPCID) 20 MG tablet, Take 20 mg by mouth 2 (two) times daily., Disp: , Rfl:    fluticasone (FLONASE) 50 MCG/ACT nasal spray, Place 1 spray into both nostrils daily., Disp: , Rfl:    gabapentin (NEURONTIN) 100 MG capsule, TAKE 1 CAPSULE (100 MG TOTAL) BY MOUTH 2 (TWO) TIMES DAILY., Disp: 180 capsule, Rfl: 0   levothyroxine (SYNTHROID) 88 MCG tablet, TAKE 1 TABLET(88 MCG) BY MOUTH DAILY BEFORE BREAKFAST, Disp: 90 tablet, Rfl: 3   memantine (NAMENDA) 10 MG tablet, Take half tablet twice daily x 1 week, then increase to half tablet in the morning and 1 tablet at bedtime x  1 week, then 1 tablet BID, Disp: 60 tablet, Rfl: 11   metolazone (ZAROXOLYN) 2.5 MG tablet, TAKE 1 TABLET BY MOUTH ONCE A WEEK, Disp: 4 tablet, Rfl: 1   midodrine (PROAMATINE) 5 MG tablet, Take 1 tablet (5 mg total) by mouth 3 (three) times daily. (Patient taking differently: Take 10 mg by mouth 3 (three) times daily.), Disp: 200 tablet, Rfl: 3   Multiple Vitamin (MULTIVITAMIN WITH MINERALS) TABS tablet, Take 1 tablet by mouth daily., Disp: , Rfl:    nystatin (MYCOSTATIN) 100000 UNIT/ML suspension, Take 5 mLs by mouth 4 (four) times daily., Disp: , Rfl:    Ondansetron HCl (ZOFRAN PO), 4 mg. Take 1 tablet on the tongue and allow to dissolve, Disp: , Rfl:    phenol (CHLORASEPTIC) 1.4 % LIQD, Use as directed 1 spray in the mouth or throat as needed for throat irritation / pain., Disp: , Rfl:  potassium chloride SA (KLOR-CON M) 20 MEQ tablet, Take 3 tablets (60 mEq total) by mouth 2 (two) times daily., Disp: 572 tablet, Rfl: 3   risperiDONE (RISPERDAL) 0.5 MG tablet, Take 1 tablet (0.5 mg total) by mouth 2 (two) times daily at 8 am and 4 pm., Disp: 60 tablet, Rfl: 2   simvastatin (ZOCOR) 10 MG tablet, TAKE 1 TABLET EVERY EVENING, Disp: 90 tablet, Rfl: 3   spironolactone (ALDACTONE) 25 MG tablet, TAKE 1 TABLET (25 MG TOTAL) BY MOUTH EVERY EVENING., Disp: 90 tablet, Rfl: 3   tirzepatide (MOUNJARO) 5 MG/0.5ML Pen, Inject 5 mg into the skin once a week., Disp: 2 mL, Rfl: 0   torsemide (DEMADEX) 20 MG tablet, Take 5 tablets (100 mg total) by mouth 2 (two) times daily., Disp: 900 tablet, Rfl: 2 Allergies  Allergen Reactions   Ceftin Anaphylaxis    Face and throat swell    Cefuroxime Axetil Anaphylaxis    Face and throat swell   Geodon [Ziprasidone Hcl] Hives   Lisinopril Other (See Comments)    angioedema   Cefuroxime     Other reaction(s): anaphylaxis   Sulfacetamide Sodium-Sulfur     Other reaction(s): itch   Allopurinol Nausea Only and Other (See Comments)    weakness   Ativan [Lorazepam]  Itching   Sulfa Antibiotics Itching   Valium [Diazepam] Other (See Comments)    Patient states that diazepam doesn't relax, it has the opposite effect.     Social History   Socioeconomic History   Marital status: Married    Spouse name: Not on file   Number of children: 2   Years of education: 8   Highest education level: Master's degree (e.g., MA, MS, MEng, MEd, MSW, MBA)  Occupational History   Occupation: retired  Tobacco Use   Smoking status: Some Days    Current packs/day: 0.00    Average packs/day: 0.1 packs/day for 26.0 years (2.6 ttl pk-yrs)    Types: Cigarettes    Start date: 03/24/1995    Last attempt to quit: 03/23/2021    Years since quitting: 2.3   Smokeless tobacco: Never  Vaping Use   Vaping status: Never Used  Substance and Sexual Activity   Alcohol use: No   Drug use: No   Sexual activity: Yes  Other Topics Concern   Not on file  Social History Narrative   Tobacco Use Cigarettes: Former Smoker, Quit in 2008   No Alcohol   No recreational drug use   Diet: Regular/Low Carb   Exercise: None   Occupation: disabled   Education: Company secretary, masters   Children: 2   Firearms: No   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      Social Drivers of Health   Financial Resource Strain: Not on file  Food Insecurity: No Food Insecurity (03/31/2023)   Hunger Vital Sign    Worried About Running Out of Food in the Last Year: Never true    Ran Out of Food in the Last Year: Never true  Transportation Needs: No Transportation Needs (03/31/2023)   PRAPARE - Administrator, Civil Service (Medical): No    Lack of Transportation (Non-Medical): No  Physical Activity: Not on file  Stress: No Stress Concern Present (08/11/2022)   [name] of Occupational Health - Occupational Stress Questionnaire    Feeling of Stress : Only a little  Social Connections: Not on file  Intimate Partner Violence: Not At Risk  (07/27/2022)   Humiliation, Afraid, Rape, and Kick questionnaire    Fear of Current or Ex-Partner: No    Emotionally Abused: No    Physically Abused: No    Sexually Abused: No    Physical Exam      Future Appointments  Date Time Provider Department Center  07/28/2023  1:45 PM [facility] LAB [facility] None  07/29/2023  1:00 PM [facility] None  08/16/2023  7:25 AM [facility] DEVICE REMOTES [facility]  09/02/2023  3:30 PM [name] D, LCSW [facility] None  09/09/2023  3:20 PM Arfeen, Phillips Grout, MD [facility] None  09/13/2023  7:10 AM [facility] DEVICE REMOTES [facility]  09/22/2023  1:45 PM Freddie Breech, DPM [facility]  10/13/2023  1:30 PM [facility] PA/NP [facility] None  12/13/2023  7:20 AM [facility] DEVICE REMOTES [facility]  01/11/2024  2:30 PM Patel, Donika K, DO [facility] None  03/13/2024  7:10 AM [facility] DEVICE REMOTES [facility]  06/12/2024  7:10 AM [facility] DEVICE REMOTES [facility]  09/11/2024  7:10 AM [facility] DEVICE REMOTES [facility]  12/11/2024  7:10 AM [facility] DEVICE REMOTES [facility]  03/12/2025  7:10 AM [facility] DEVICE REMOTES [facility]

## 2023-07-28 ENCOUNTER — Ambulatory Visit (HOSPITAL_COMMUNITY)
Admission: RE | Admit: 2023-07-28 | Discharge: 2023-07-28 | Disposition: A | Discharge status: Home / Self Care | Payer: Medicare HMO | Source: Ambulatory Visit | Attending: Internal Medicine | Admitting: Internal Medicine

## 2023-07-28 ENCOUNTER — Encounter (HOSPITAL_COMMUNITY): Payer: Medicare HMO

## 2023-07-28 DIAGNOSIS — I5022 Chronic systolic (congestive) heart failure: Secondary | ICD-10-CM | POA: Diagnosis not present

## 2023-07-28 LAB — BASIC METABOLIC PANEL
Anion gap: 17 — ABNORMAL HIGH (ref 5–15)
BUN: 52 mg/dL — ABNORMAL HIGH (ref 8–23)
CO2: 26 mmol/L (ref 22–32)
Calcium: 9.4 mg/dL (ref 8.9–10.3)
Chloride: 92 mmol/L — ABNORMAL LOW (ref 98–111)
Creatinine, Ser: 2.06 mg/dL — ABNORMAL HIGH (ref 0.44–1.00)
GFR, Estimated: 25 mL/min — ABNORMAL LOW (ref >60)
Glucose, Bld: 86 mg/dL (ref 70–99)
Potassium: 3.5 mmol/L (ref 3.5–5.1)
Sodium: 135 mmol/L (ref 135–145)

## 2023-07-29 ENCOUNTER — Ambulatory Visit (HOSPITAL_COMMUNITY)
Admission: RE | Admit: 2023-07-29 | Discharge: 2023-07-29 | Disposition: A | Discharge status: Home / Self Care | Source: Ambulatory Visit | Attending: Internal Medicine | Admitting: Internal Medicine

## 2023-07-29 DIAGNOSIS — I5022 Chronic systolic (congestive) heart failure: Secondary | ICD-10-CM | POA: Insufficient documentation

## 2023-07-29 MED ORDER — SODIUM CHLORIDE 0.9 % IV SOLN
510.0000 mg | Freq: Once | INTRAVENOUS | Status: AC
  Administered 2023-07-29: 510 mg via INTRAVENOUS
  Filled 2023-07-29: qty 510

## 2023-08-03 ENCOUNTER — Other Ambulatory Visit (HOSPITAL_COMMUNITY): Payer: Self-pay | Admitting: Psychiatry

## 2023-08-03 ENCOUNTER — Telehealth (HOSPITAL_COMMUNITY): Payer: Self-pay

## 2023-08-03 ENCOUNTER — Encounter: Payer: Self-pay | Admitting: Cardiology

## 2023-08-03 ENCOUNTER — Other Ambulatory Visit (HOSPITAL_COMMUNITY): Payer: Self-pay

## 2023-08-03 DIAGNOSIS — F411 Generalized anxiety disorder: Secondary | ICD-10-CM

## 2023-08-03 DIAGNOSIS — F330 Major depressive disorder, recurrent, mild: Secondary | ICD-10-CM

## 2023-08-03 NOTE — Progress Notes (Signed)
 Paramedicine Encounter    Patient ID: Rhonda [name], female    DOB: 1950/06/04, 73 y.o.   MRN: [id number]   Complaints- right wrist pain and headache    Assessment- CAOX4, warm and dry, ambulatory, no shortness of breath, no lower leg swelling,   Compliance with meds- Weds and Saturday   Pill box filled- for one week   Refills needed-  - torsemide -risperidone -desloratadine -clonzepam  -citalopram    Meds changes since last visit- none     Social changes- none    VISIT SUMMARY-  Arrived for home visit for Rhonda [name] who reports to be feeling okay other than right wrist pain and headache. She is taking tylenol for same. Vitals and assessment obtained. No lower leg swelling, lungs clear. No weight gain.  She is taking her monjaro shots on Thursdays. Meds reviewed and pill box filled for one week. Refills as noted. Appointments reviewed and confirmed. Home visit complete for one week.   BP 120/62   Pulse 89   Wt 220 lb (99.8 kg)   SpO2 98%   BMI 42.97 kg/m  Weight yesterday-- didn't weigh  Last visit weight-- 220lbs      ACTION: Home visit completed     Patient Care Team: Shon Hale, MD as PCP - General (Family Medicine) Burna Sis, LCSW as Social Worker (Licensed Clinical Social Worker) [name], Noberto Retort, DO as Consulting Physician (Neurology) [name], Paramedic as Paramedic Chanetta Marshall, Meridee Score, MD as Attending Physician (Family Medicine) Clinton Gallant, RN as [facility] Care Management Bensimhon, Bevelyn Buckles, MD as Consulting Physician (Cardiology) Cleotis Nipper, MD as Consulting Physician (Psychiatry) Candelaria Stagers, DPM as Consulting Physician (Podiatry)  Patient Active Problem List   Diagnosis Date Noted   Mild dementia 04/20/2023   Diabetic neuropathy    Elevated troponin 12/02/2021   Acute left ankle pain 10/11/2021   Hyponatremia 10/08/2021   Decreased estrogen level 08/01/2021   Hyperparathyroidism  08/01/2021   Spondylolisthesis 08/01/2021   Varicose veins of bilateral lower extremities with other complications 08/01/2021   Lumbago without sciatica 04/30/2021   Abnormal gait 06/11/2020   Allergic rhinitis 06/11/2020   Asthma 06/11/2020   Benign intracranial hypertension 06/11/2020   Body mass index (BMI) 45.0-49.9, adult 06/11/2020   Bowel incontinence 06/11/2020   Cholelithiasis 06/11/2020   Chronic sinusitis 06/11/2020   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   Daytime somnolence 06/11/2020   Edema 06/11/2020   Family history of malignant neoplasm of gastrointestinal tract 06/11/2020   Gout 06/11/2020   Insomnia 06/11/2020   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   Lumbar spondylosis with myelopathy 06/11/2020   Macrocytosis 06/11/2020   History of colonic polyps 06/11/2020   Repeated falls 06/11/2020   Irritable bowel syndrome 01/04/2020   Spinal stenosis of lumbar region 01/03/2020   Bilateral leg weakness 08/01/2019   Type II diabetes mellitus 06/15/2019   Hypokalemia 11/08/2018   CKD (chronic kidney disease) stage 3, GFR 30-59 ml/min 11/08/2018   Orthostatic hypotension 07/28/2017   On home oxygen therapy    Migraines    Left bundle branch block    Hypothyroidism    Hypertension    Hyperlipidemia    Heart murmur    GERD (gastroesophageal reflux disease)    Exertional shortness of breath    Major depressive disorder    Back pain    Arthritis    Generalized anxiety disorder    Anemia    Chronic respiratory failure 09/14/2013   Biventricular ICD (implantable  cardioverter-defibrillator) in place 08/04/2013   Chronic systolic heart failure 10/27/2012   Endometrial polyp 01/20/2012   Malignant tumor of breast 03/26/2011   Vitamin D deficiency 03/26/2011    Current Outpatient Medications:    acetaminophen (TYLENOL) 500 MG tablet, Take 500 mg by mouth every 6 (six) hours as needed., Disp: , Rfl:    acetaZOLAMIDE (DIAMOX) 125 MG tablet, Take 1 tablet (125 mg total) by mouth daily.,  Disp: 90 tablet, Rfl: 3   allopurinol (ZYLOPRIM) 100 MG tablet, Take 0.5 tablets (50 mg total) by mouth daily., Disp: 45 tablet, Rfl: 1   azelastine (ASTELIN) 0.1 % nasal spray, Place 2 sprays into both nostrils 2 (two) times daily. Use in each nostril as directed, Disp: 30 mL, Rfl: 12   buPROPion (WELLBUTRIN XL) 150 MG 24 hr tablet, Take 1 tablet (150 mg total) by mouth daily., Disp: 30 tablet, Rfl: 2   carvedilol (COREG) 3.125 MG tablet, TAKE 1 TABLET (3.125 MG) BY MOUTH TWICE DAILY WITH MEALS, Disp: 180 tablet, Rfl: 3   citalopram (CELEXA) 20 MG tablet, Take 1 tablet (20 mg total) by mouth daily., Disp: 30 tablet, Rfl: 2   clonazePAM (KLONOPIN) 0.5 MG tablet, Take 1 tablet (0.5 mg total) by mouth 2 (two) times daily., Disp: 60 tablet, Rfl: 2   cyanocobalamin (VITAMIN B12) 1000 MCG tablet, Take 1,000 mcg by mouth daily., Disp: , Rfl:    desloratadine (CLARINEX) 5 MG tablet, Take 1 tablet (5 mg total) by mouth daily., Disp: 90 tablet, Rfl: 3   dicyclomine (BENTYL) 20 MG tablet, Take 20 mg by mouth 3 (three) times daily before meals., Disp: , Rfl:    famotidine (PEPCID) 20 MG tablet, Take 20 mg by mouth 2 (two) times daily., Disp: , Rfl:    fluticasone (FLONASE) 50 MCG/ACT nasal spray, Place 1 spray into both nostrils daily., Disp: , Rfl:    gabapentin (NEURONTIN) 100 MG capsule, TAKE 1 CAPSULE (100 MG TOTAL) BY MOUTH 2 (TWO) TIMES DAILY., Disp: 180 capsule, Rfl: 0   levothyroxine (SYNTHROID) 88 MCG tablet, TAKE 1 TABLET(88 MCG) BY MOUTH DAILY BEFORE BREAKFAST, Disp: 90 tablet, Rfl: 3   memantine (NAMENDA) 10 MG tablet, Take half tablet twice daily x 1 week, then increase to half tablet in the morning and 1 tablet at bedtime x 1 week, then 1 tablet BID, Disp: 60 tablet, Rfl: 11   metolazone (ZAROXOLYN) 2.5 MG tablet, TAKE 1 TABLET BY MOUTH ONCE A WEEK, Disp: 4 tablet, Rfl: 1   midodrine (PROAMATINE) 5 MG tablet, Take 1 tablet (5 mg total) by mouth 3 (three) times daily. (Patient taking differently:  Take 10 mg by mouth 3 (three) times daily.), Disp: 200 tablet, Rfl: 3   Multiple Vitamin (MULTIVITAMIN WITH MINERALS) TABS tablet, Take 1 tablet by mouth daily., Disp: , Rfl:    nystatin (MYCOSTATIN) 100000 UNIT/ML suspension, Take 5 mLs by mouth 4 (four) times daily., Disp: , Rfl:    Ondansetron HCl (ZOFRAN PO), 4 mg. Take 1 tablet on the tongue and allow to dissolve, Disp: , Rfl:    phenol (CHLORASEPTIC) 1.4 % LIQD, Use as directed 1 spray in the mouth or throat as needed for throat irritation / pain., Disp: , Rfl:    potassium chloride SA (KLOR-CON M) 20 MEQ tablet, Take 3 tablets (60 mEq total) by mouth 2 (two) times daily., Disp: 572 tablet, Rfl: 3   risperiDONE (RISPERDAL) 0.5 MG tablet, Take 1 tablet (0.5 mg total) by mouth 2 (two) times daily at  8 am and 4 pm., Disp: 60 tablet, Rfl: 2   simvastatin (ZOCOR) 10 MG tablet, TAKE 1 TABLET EVERY EVENING, Disp: 90 tablet, Rfl: 3   spironolactone (ALDACTONE) 25 MG tablet, TAKE 1 TABLET (25 MG TOTAL) BY MOUTH EVERY EVENING., Disp: 90 tablet, Rfl: 3   tirzepatide (MOUNJARO) 5 MG/0.5ML Pen, Inject 5 mg into the skin once a week., Disp: 2 mL, Rfl: 0   torsemide (DEMADEX) 20 MG tablet, Take 5 tablets (100 mg total) by mouth 2 (two) times daily., Disp: 900 tablet, Rfl: 2 Allergies  Allergen Reactions   Ceftin Anaphylaxis    Face and throat swell    Cefuroxime Axetil Anaphylaxis    Face and throat swell   Geodon [Ziprasidone Hcl] Hives   Lisinopril Other (See Comments)    angioedema   Cefuroxime     Other reaction(s): anaphylaxis   Sulfacetamide Sodium-Sulfur     Other reaction(s): itch   Allopurinol Nausea Only and Other (See Comments)    weakness   Ativan [Lorazepam] Itching   Sulfa Antibiotics Itching   Valium [Diazepam] Other (See Comments)    Patient states that diazepam doesn't relax, it has the opposite effect.     Social History   Socioeconomic History   Marital status: Married    Spouse name: Not on file   Number of children:  2   Years of education: 35   Highest education level: Master's degree (e.g., MA, MS, MEng, MEd, MSW, MBA)  Occupational History   Occupation: retired  Tobacco Use   Smoking status: Some Days    Current packs/day: 0.00    Average packs/day: 0.1 packs/day for 26.0 years (2.6 ttl pk-yrs)    Types: Cigarettes    Start date: 03/24/1995    Last attempt to quit: 03/23/2021    Years since quitting: 2.3   Smokeless tobacco: Never  Vaping Use   Vaping status: Never Used  Substance and Sexual Activity   Alcohol use: No   Drug use: No   Sexual activity: Yes  Other Topics Concern   Not on file  Social History Narrative   Tobacco Use Cigarettes: Former Smoker, Quit in 2008   No Alcohol   No recreational drug use   Diet: Regular/Low Carb   Exercise: None   Occupation: disabled   Education: Company secretary, masters   Children: 2   Firearms: No   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      Social Drivers of Health   Financial Resource Strain: Not on file  Food Insecurity: No Food Insecurity (03/31/2023)   Hunger Vital Sign    Worried About Running Out of Food in the Last Year: Never true    Ran Out of Food in the Last Year: Never true  Transportation Needs: No Transportation Needs (03/31/2023)   PRAPARE - Administrator, Civil Service (Medical): No    Lack of Transportation (Non-Medical): No  Physical Activity: Not on file  Stress: No Stress Concern Present (08/11/2022)   [name] of Occupational Health - Occupational Stress Questionnaire    Feeling of Stress : Only a little  Social Connections: Not on file  Intimate Partner Violence: Not At Risk (07/27/2022)   Humiliation, Afraid, Rape, and Kick questionnaire    Fear of Current or Ex-Partner: No    Emotionally Abused: No    Physically Abused: No    Sexually Abused: No  Physical Exam      Future Appointments  Date Time Provider Department Center  08/16/2023  7:25  AM [facility] DEVICE REMOTES [facility]  09/02/2023  3:30 PM [name] D, LCSW [facility] None  09/09/2023  3:20 PM Arfeen, Phillips Grout, MD [facility] None  09/13/2023  7:10 AM [facility] DEVICE REMOTES [facility]  09/22/2023  1:45 PM Freddie Breech, DPM [facility]  10/13/2023  1:30 PM [facility] PA/NP [facility] None  12/13/2023  7:20 AM [facility] DEVICE REMOTES [facility]  01/11/2024  2:30 PM Patel, Donika K, DO [facility] None  03/13/2024  7:10 AM [facility] DEVICE REMOTES [facility]  06/12/2024  7:10 AM [facility] DEVICE REMOTES [facility]  09/11/2024  7:10 AM [facility] DEVICE REMOTES [facility]  12/11/2024  7:10 AM [facility] DEVICE REMOTES [facility]  03/12/2025  7:10 AM [facility] DEVICE REMOTES [facility]

## 2023-08-03 NOTE — Telephone Encounter (Signed)
 HF Paramedicine Message to [facility]  Pharmacy (if applicable): [facility]    Issue/reason for call:  Requesting 100mg  tablets of Torsemide be sent to [facility].    [name], EMT-Paramedic [phone] 08/03/2023

## 2023-08-04 ENCOUNTER — Other Ambulatory Visit (HOSPITAL_COMMUNITY): Payer: Self-pay

## 2023-08-04 MED ORDER — TORSEMIDE 100 MG PO TABS: 100.0000 mg | ORAL_TABLET | Freq: Two times a day (BID) | ORAL | 3 refills | Status: DC

## 2023-08-04 NOTE — Telephone Encounter (Signed)
 done

## 2023-08-09 ENCOUNTER — Other Ambulatory Visit (HOSPITAL_COMMUNITY): Payer: Self-pay

## 2023-08-09 NOTE — Progress Notes (Signed)
 Paramedicine Encounter    Patient ID: Rhonda [name], female    DOB: 1951-03-27, 73 y.o.   MRN: [id number]   Complaints- cough for several weeks with headache   Assessment- CAOX4, warm and dry ambulating around her home with no complaints other than continued head and sinus congestion with a cough. Lungs clear, no edema   Compliance with meds- missed two noon doses, two evening doses.   Pill box filled- for one week   Refills needed- bentyl   Meds changes since last visit- none     Social changes- none    VISIT SUMMARY- Arrived for home visit for Rhonda [name] who reports to be feeling well with no complaints other than continued head and sinus congestion. She is using OTC meds when she remembers to take them. I obtained vitals and assessment. Lungs clear, no edema noted. Meds reviewed and pill box filled for one week. We reviewed upcoming appointments. Home Visit complete. I will see Rhonda [name] in one week.   BP 110/64   Pulse 79   Resp 16   Wt 214 lb (97.1 kg)   SpO2 99%   BMI 41.79 kg/m  Weight yesterday-- didn't weigh  Last visit weight-- 220lbs      ACTION: Home visit completed     Patient Care Team: Shon Hale, MD as PCP - General (Family Medicine) Burna Sis, LCSW as Social Worker (Licensed Clinical Social Worker) [name], Noberto Retort, DO as Consulting Physician (Neurology) [name], Paramedic as Paramedic Chanetta Marshall, Meridee Score, MD as Attending Physician (Family Medicine) Clinton Gallant, RN as [facility] Care Management Bensimhon, Bevelyn Buckles, MD as Consulting Physician (Cardiology) Cleotis Nipper, MD as Consulting Physician (Psychiatry) Candelaria Stagers, DPM as Consulting Physician (Podiatry)  Patient Active Problem List   Diagnosis Date Noted   Mild dementia 04/20/2023   Diabetic neuropathy    Elevated troponin 12/02/2021   Acute left ankle pain 10/11/2021   Hyponatremia 10/08/2021   Decreased estrogen level 08/01/2021    Hyperparathyroidism 08/01/2021   Spondylolisthesis 08/01/2021   Varicose veins of bilateral lower extremities with other complications 08/01/2021   Lumbago without sciatica 04/30/2021   Abnormal gait 06/11/2020   Allergic rhinitis 06/11/2020   Asthma 06/11/2020   Benign intracranial hypertension 06/11/2020   Body mass index (BMI) 45.0-49.9, adult 06/11/2020   Bowel incontinence 06/11/2020   Cholelithiasis 06/11/2020   Chronic sinusitis 06/11/2020   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   Daytime somnolence 06/11/2020   Edema 06/11/2020   Family history of malignant neoplasm of gastrointestinal tract 06/11/2020   Gout 06/11/2020   Insomnia 06/11/2020   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   Lumbar spondylosis with myelopathy 06/11/2020   Macrocytosis 06/11/2020   History of colonic polyps 06/11/2020   Repeated falls 06/11/2020   Irritable bowel syndrome 01/04/2020   Spinal stenosis of lumbar region 01/03/2020   Bilateral leg weakness 08/01/2019   Type II diabetes mellitus 06/15/2019   Hypokalemia 11/08/2018   CKD (chronic kidney disease) stage 3, GFR 30-59 ml/min 11/08/2018   Orthostatic hypotension 07/28/2017   On home oxygen therapy    Migraines    Left bundle branch block    Hypothyroidism    Hypertension    Hyperlipidemia    Heart murmur    GERD (gastroesophageal reflux disease)    Exertional shortness of breath    Major depressive disorder    Back pain    Arthritis    Generalized anxiety disorder    Anemia    Chronic respiratory  failure 09/14/2013   Biventricular ICD (implantable cardioverter-defibrillator) in place 08/04/2013   Chronic systolic heart failure 10/27/2012   Endometrial polyp 01/20/2012   Malignant tumor of breast 03/26/2011   Vitamin D deficiency 03/26/2011    Current Outpatient Medications:    acetaminophen (TYLENOL) 500 MG tablet, Take 500 mg by mouth every 6 (six) hours as needed., Disp: , Rfl:    acetaZOLAMIDE (DIAMOX) 125 MG tablet, Take 1 tablet (125 mg  total) by mouth daily., Disp: 90 tablet, Rfl: 3   allopurinol (ZYLOPRIM) 100 MG tablet, Take 0.5 tablets (50 mg total) by mouth daily., Disp: 45 tablet, Rfl: 1   azelastine (ASTELIN) 0.1 % nasal spray, Place 2 sprays into both nostrils 2 (two) times daily. Use in each nostril as directed, Disp: 30 mL, Rfl: 12   buPROPion (WELLBUTRIN XL) 150 MG 24 hr tablet, Take 1 tablet (150 mg total) by mouth daily., Disp: 30 tablet, Rfl: 2   carvedilol (COREG) 3.125 MG tablet, TAKE 1 TABLET (3.125 MG) BY MOUTH TWICE DAILY WITH MEALS, Disp: 180 tablet, Rfl: 3   citalopram (CELEXA) 20 MG tablet, Take 1 tablet (20 mg total) by mouth daily., Disp: 30 tablet, Rfl: 2   clonazePAM (KLONOPIN) 0.5 MG tablet, Take 1 tablet (0.5 mg total) by mouth 2 (two) times daily., Disp: 60 tablet, Rfl: 2   cyanocobalamin (VITAMIN B12) 1000 MCG tablet, Take 1,000 mcg by mouth daily., Disp: , Rfl:    desloratadine (CLARINEX) 5 MG tablet, Take 1 tablet (5 mg total) by mouth daily., Disp: 90 tablet, Rfl: 3   dicyclomine (BENTYL) 20 MG tablet, Take 20 mg by mouth 3 (three) times daily before meals., Disp: , Rfl:    famotidine (PEPCID) 20 MG tablet, Take 20 mg by mouth 2 (two) times daily., Disp: , Rfl:    fluticasone (FLONASE) 50 MCG/ACT nasal spray, Place 1 spray into both nostrils daily., Disp: , Rfl:    gabapentin (NEURONTIN) 100 MG capsule, TAKE 1 CAPSULE (100 MG TOTAL) BY MOUTH 2 (TWO) TIMES DAILY., Disp: 180 capsule, Rfl: 0   levothyroxine (SYNTHROID) 88 MCG tablet, TAKE 1 TABLET(88 MCG) BY MOUTH DAILY BEFORE BREAKFAST, Disp: 90 tablet, Rfl: 3   memantine (NAMENDA) 10 MG tablet, Take half tablet twice daily x 1 week, then increase to half tablet in the morning and 1 tablet at bedtime x 1 week, then 1 tablet BID, Disp: 60 tablet, Rfl: 11   metolazone (ZAROXOLYN) 2.5 MG tablet, TAKE 1 TABLET BY MOUTH ONCE A WEEK, Disp: 4 tablet, Rfl: 1   midodrine (PROAMATINE) 5 MG tablet, Take 1 tablet (5 mg total) by mouth 3 (three) times daily.  (Patient taking differently: Take 10 mg by mouth 3 (three) times daily.), Disp: 200 tablet, Rfl: 3   Multiple Vitamin (MULTIVITAMIN WITH MINERALS) TABS tablet, Take 1 tablet by mouth daily., Disp: , Rfl:    nystatin (MYCOSTATIN) 100000 UNIT/ML suspension, Take 5 mLs by mouth 4 (four) times daily., Disp: , Rfl:    Ondansetron HCl (ZOFRAN PO), 4 mg. Take 1 tablet on the tongue and allow to dissolve, Disp: , Rfl:    phenol (CHLORASEPTIC) 1.4 % LIQD, Use as directed 1 spray in the mouth or throat as needed for throat irritation / pain., Disp: , Rfl:    potassium chloride SA (KLOR-CON M) 20 MEQ tablet, Take 3 tablets (60 mEq total) by mouth 2 (two) times daily., Disp: 572 tablet, Rfl: 3   risperiDONE (RISPERDAL) 0.5 MG tablet, Take 1 tablet (0.5 mg total)  by mouth 2 (two) times daily at 8 am and 4 pm., Disp: 60 tablet, Rfl: 2   simvastatin (ZOCOR) 10 MG tablet, TAKE 1 TABLET EVERY EVENING, Disp: 90 tablet, Rfl: 3   spironolactone (ALDACTONE) 25 MG tablet, TAKE 1 TABLET (25 MG TOTAL) BY MOUTH EVERY EVENING., Disp: 90 tablet, Rfl: 3   tirzepatide (MOUNJARO) 5 MG/0.5ML Pen, Inject 5 mg into the skin once a week., Disp: 2 mL, Rfl: 0   torsemide (DEMADEX) 100 MG tablet, Take 1 tablet (100 mg total) by mouth 2 (two) times daily., Disp: 180 tablet, Rfl: 3 Allergies  Allergen Reactions   Ceftin Anaphylaxis    Face and throat swell    Cefuroxime Axetil Anaphylaxis    Face and throat swell   Geodon [Ziprasidone Hcl] Hives   Lisinopril Other (See Comments)    angioedema   Cefuroxime     Other reaction(s): anaphylaxis   Sulfacetamide Sodium-Sulfur     Other reaction(s): itch   Allopurinol Nausea Only and Other (See Comments)    weakness   Ativan [Lorazepam] Itching   Sulfa Antibiotics Itching   Valium [Diazepam] Other (See Comments)    Patient states that diazepam doesn't relax, it has the opposite effect.     Social History   Socioeconomic History   Marital status: Married    Spouse name: Not on  file   Number of children: 2   Years of education: 70   Highest education level: Master's degree (e.g., MA, MS, MEng, MEd, MSW, MBA)  Occupational History   Occupation: retired  Tobacco Use   Smoking status: Some Days    Current packs/day: 0.00    Average packs/day: 0.1 packs/day for 26.0 years (2.6 ttl pk-yrs)    Types: Cigarettes    Start date: 03/24/1995    Last attempt to quit: 03/23/2021    Years since quitting: 2.3   Smokeless tobacco: Never  Vaping Use   Vaping status: Never Used  Substance and Sexual Activity   Alcohol use: No   Drug use: No   Sexual activity: Yes  Other Topics Concern   Not on file  Social History Narrative   Tobacco Use Cigarettes: Former Smoker, Quit in 2008   No Alcohol   No recreational drug use   Diet: Regular/Low Carb   Exercise: None   Occupation: disabled   Education: Company secretary, masters   Children: 2   Firearms: No   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      Social Drivers of Health   Financial Resource Strain: Not on file  Food Insecurity: No Food Insecurity (03/31/2023)   Hunger Vital Sign    Worried About Running Out of Food in the Last Year: Never true    Ran Out of Food in the Last Year: Never true  Transportation Needs: No Transportation Needs (03/31/2023)   PRAPARE - Administrator, Civil Service (Medical): No    Lack of Transportation (Non-Medical): No  Physical Activity: Not on file  Stress: No Stress Concern Present (08/11/2022)   [name] of Occupational Health - Occupational Stress Questionnaire    Feeling of Stress : Only a little  Social Connections: Not on file  Intimate Partner Violence: Not At Risk (07/27/2022)   Humiliation, Afraid, Rape, and Kick questionnaire    Fear of Current or Ex-Partner: No    Emotionally Abused: No    Physically Abused: No  Sexually Abused: No    Physical Exam      Future Appointments  Date Time Provider  Department Center  08/16/2023  7:25 AM [facility] DEVICE REMOTES [facility]  09/02/2023  3:30 PM [name] D, LCSW [facility] None  09/09/2023  3:20 PM Arfeen, Phillips Grout, MD [facility] None  09/13/2023  7:10 AM [facility] DEVICE REMOTES [facility]  09/22/2023  1:45 PM Freddie Breech, DPM [facility]  10/13/2023  1:30 PM [facility] PA/NP [facility] None  12/13/2023  7:20 AM [facility] DEVICE REMOTES [facility]  01/11/2024  2:30 PM Patel, Donika K, DO [facility] None  03/13/2024  7:10 AM [facility] DEVICE REMOTES [facility]  06/12/2024  7:10 AM [facility] DEVICE REMOTES [facility]  09/11/2024  7:10 AM [facility] DEVICE REMOTES [facility]  12/11/2024  7:10 AM [facility] DEVICE REMOTES [facility]  03/12/2025  7:10 AM [facility] DEVICE REMOTES [facility]

## 2023-08-12 NOTE — Patient Instructions (Signed)
 Visit Information  Thank you for taking time to visit with me today. Please don't hesitate to contact me if I can be of assistance to you.   Following are the goals we discussed today:   Goals Addressed             This Visit's Progress    [facility] care coordination post hospital services       Interventions Today    Flowsheet Row Most Recent Value  Chronic Disease   Chronic disease during today's visit Other  [foot pain home safety]  General Interventions   General Interventions Discussed/Reviewed General Interventions Reviewed, Doctor Visits, Durable Medical Equipment (DME)  Doctor Visits Discussed/Reviewed Doctor Visits Reviewed, Specialist  Durable Medical Equipment (DME) Other  Gilmer Mor no wheelchair]  PCP/Specialist Visits Compliance with follow-up visit  Exercise Interventions   Exercise Discussed/Reviewed Exercise Reviewed, Physical Activity  Physical Activity Discussed/Reviewed Physical Activity Reviewed, Types of exercise  Education Interventions   Education Provided Provided Education  Provided Verbal Education On Exercise, Community Resources, Foot Care, Other  Mental Health Interventions   Mental Health Discussed/Reviewed Mental Health Reviewed, Coping Strategies  Nutrition Interventions   Nutrition Discussed/Reviewed Nutrition Discussed, Fluid intake, Increasing proteins  Pharmacy Interventions   Pharmacy Dicussed/Reviewed Pharmacy Topics Reviewed, Affording Medications  Safety Interventions   Safety Discussed/Reviewed Safety Reviewed, Home Safety  Home Safety Assistive Devices              Our next appointment is  na  on na at na  Please call the care guide team at [phone] if you need to cancel or reschedule your appointment.   If you are experiencing a Mental Health or Behavioral Health Crisis or need someone to talk to, please call the Suicide and Crisis Lifeline: 988 call the [place] National Suicide Prevention Lifeline: (364)802-5526 or TTY:  321-543-3622 TTY 754 671 0172) to talk to a trained counselor call 1-800-273-TALK (toll free, 24 hour hotline) call the [facility]: [phone] call 911   Patient verbalizes understanding of instructions and care plan provided today and agrees to view in MyChart. Active MyChart status and patient understanding of how to access instructions and care plan via MyChart confirmed with patient.     The patient has been provided with contact information for the care management team and has been advised to call with any health related questions or concerns.    Taunia Frasco L. Noelle Penner, RN, BSN, CCM [place]  [facility], [facility] RN Care Manager Direct Dial: [phone]  Fax: [phone]

## 2023-08-13 ENCOUNTER — Telehealth: Payer: Self-pay | Admitting: Pharmacist

## 2023-08-13 MED ORDER — MOUNJARO 7.5 MG/0.5ML ~~LOC~~ SOAJ: 7.5000 mg | SUBCUTANEOUS | 0 refills | Status: DC

## 2023-08-13 NOTE — Telephone Encounter (Signed)
 Call to titrate Mounjaro dose. Spoke to husband. She tolerates 5 mg dose well. Will increase the dose to 7.5 mg. F/u in 4 weeks  No major change on weight noted and pt does not check BG at home. Not on any other antidiabetic medications.

## 2023-08-16 ENCOUNTER — Ambulatory Visit: Payer: Medicare HMO | Attending: Internal Medicine

## 2023-08-16 ENCOUNTER — Other Ambulatory Visit (HOSPITAL_COMMUNITY): Payer: Self-pay

## 2023-08-16 DIAGNOSIS — Z95810 Presence of automatic (implantable) cardiac defibrillator: Secondary | ICD-10-CM

## 2023-08-16 DIAGNOSIS — I5022 Chronic systolic (congestive) heart failure: Secondary | ICD-10-CM | POA: Diagnosis not present

## 2023-08-16 NOTE — Progress Notes (Signed)
 Paramedicine Encounter    Patient ID: Rhonda [name], female    DOB: March 02, 1951, 73 y.o.   MRN: [id number]   Complaints- chronic fatigue, coughing and sore throat   Assessment-laying in bed- reports not feeling well, congestion noted and cough worsening from last week   Compliance with meds- no missed doses in one week   Pill box filled- one week   Refills needed- none   Meds changes since last visit- none     Social changes- continuing to smoke- husband says she is smoking more often lately.     VISIT SUMMARY- Arrived for home visit for Rhonda [name] who was laying in bed reporting to not be feeling well. She is coughing with congestion and this has been ongoing for several weeks now. She did not want to get up for our visit today so no weight or vitals obtained. Her husband reports she has been fatigued and has been smoking more often lately and is very argumentative when he tries to tell her not to smoke. I reviewed meds and filled pill box. She plans to see PCP on Wednesday. I will follow up next week. Her husband knows to reach out if needed before then. Home visit complete.   There were no vitals taken for this visit. Weight yesterday-didn't weigh  Last visit weight-214lbs     ACTION: Home visit completed     Patient Care Team: Shon Hale, MD as PCP - General (Family Medicine) Burna Sis, LCSW as Social Worker (Licensed Clinical Social Worker) [name], Noberto Retort, DO as Consulting Physician (Neurology) [name], Paramedic as Paramedic Chanetta Marshall, Meridee Score, MD as Attending Physician (Family Medicine) Clinton Gallant, RN as [facility] Care Management Bensimhon, Bevelyn Buckles, MD as Consulting Physician (Cardiology) Cleotis Nipper, MD as Consulting Physician (Psychiatry) Candelaria Stagers, DPM as Consulting Physician (Podiatry)  Patient Active Problem List   Diagnosis Date Noted   Mild dementia 04/20/2023   Diabetic neuropathy    Elevated  troponin 12/02/2021   Acute left ankle pain 10/11/2021   Hyponatremia 10/08/2021   Decreased estrogen level 08/01/2021   Hyperparathyroidism 08/01/2021   Spondylolisthesis 08/01/2021   Varicose veins of bilateral lower extremities with other complications 08/01/2021   Lumbago without sciatica 04/30/2021   Abnormal gait 06/11/2020   Allergic rhinitis 06/11/2020   Asthma 06/11/2020   Benign intracranial hypertension 06/11/2020   Body mass index (BMI) 45.0-49.9, adult 06/11/2020   Bowel incontinence 06/11/2020   Cholelithiasis 06/11/2020   Chronic sinusitis 06/11/2020   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   Daytime somnolence 06/11/2020   Edema 06/11/2020   Family history of malignant neoplasm of gastrointestinal tract 06/11/2020   Gout 06/11/2020   Insomnia 06/11/2020   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   Lumbar spondylosis with myelopathy 06/11/2020   Macrocytosis 06/11/2020   History of colonic polyps 06/11/2020   Repeated falls 06/11/2020   Irritable bowel syndrome 01/04/2020   Spinal stenosis of lumbar region 01/03/2020   Bilateral leg weakness 08/01/2019   Type II diabetes mellitus 06/15/2019   Hypokalemia 11/08/2018   CKD (chronic kidney disease) stage 3, GFR 30-59 ml/min 11/08/2018   Orthostatic hypotension 07/28/2017   On home oxygen therapy    Migraines    Left bundle branch block    Hypothyroidism    Hypertension    Hyperlipidemia    Heart murmur    GERD (gastroesophageal reflux disease)    Exertional shortness of breath    Major depressive disorder    Back pain  Arthritis    Generalized anxiety disorder    Anemia    Chronic respiratory failure 09/14/2013   Biventricular ICD (implantable cardioverter-defibrillator) in place 08/04/2013   Chronic systolic heart failure 10/27/2012   Endometrial polyp 01/20/2012   Malignant tumor of breast 03/26/2011   Vitamin D deficiency 03/26/2011    Current Outpatient Medications:    acetaminophen (TYLENOL) 500 MG tablet, Take 500  mg by mouth every 6 (six) hours as needed., Disp: , Rfl:    acetaZOLAMIDE (DIAMOX) 125 MG tablet, Take 1 tablet (125 mg total) by mouth daily., Disp: 90 tablet, Rfl: 3   allopurinol (ZYLOPRIM) 100 MG tablet, Take 0.5 tablets (50 mg total) by mouth daily., Disp: 45 tablet, Rfl: 1   azelastine (ASTELIN) 0.1 % nasal spray, Place 2 sprays into both nostrils 2 (two) times daily. Use in each nostril as directed, Disp: 30 mL, Rfl: 12   buPROPion (WELLBUTRIN XL) 150 MG 24 hr tablet, Take 1 tablet (150 mg total) by mouth daily., Disp: 30 tablet, Rfl: 2   carvedilol (COREG) 3.125 MG tablet, TAKE 1 TABLET (3.125 MG) BY MOUTH TWICE DAILY WITH MEALS, Disp: 180 tablet, Rfl: 3   citalopram (CELEXA) 20 MG tablet, Take 1 tablet (20 mg total) by mouth daily., Disp: 30 tablet, Rfl: 2   clonazePAM (KLONOPIN) 0.5 MG tablet, Take 1 tablet (0.5 mg total) by mouth 2 (two) times daily., Disp: 60 tablet, Rfl: 2   cyanocobalamin (VITAMIN B12) 1000 MCG tablet, Take 1,000 mcg by mouth daily., Disp: , Rfl:    desloratadine (CLARINEX) 5 MG tablet, Take 1 tablet (5 mg total) by mouth daily., Disp: 90 tablet, Rfl: 3   dicyclomine (BENTYL) 20 MG tablet, Take 20 mg by mouth 3 (three) times daily before meals., Disp: , Rfl:    famotidine (PEPCID) 20 MG tablet, Take 20 mg by mouth 2 (two) times daily., Disp: , Rfl:    fluticasone (FLONASE) 50 MCG/ACT nasal spray, Place 1 spray into both nostrils daily., Disp: , Rfl:    gabapentin (NEURONTIN) 100 MG capsule, TAKE 1 CAPSULE (100 MG TOTAL) BY MOUTH 2 (TWO) TIMES DAILY., Disp: 180 capsule, Rfl: 0   levothyroxine (SYNTHROID) 88 MCG tablet, TAKE 1 TABLET(88 MCG) BY MOUTH DAILY BEFORE BREAKFAST, Disp: 90 tablet, Rfl: 3   memantine (NAMENDA) 10 MG tablet, Take half tablet twice daily x 1 week, then increase to half tablet in the morning and 1 tablet at bedtime x 1 week, then 1 tablet BID, Disp: 60 tablet, Rfl: 11   metolazone (ZAROXOLYN) 2.5 MG tablet, TAKE 1 TABLET BY MOUTH ONCE A WEEK, Disp:  4 tablet, Rfl: 1   midodrine (PROAMATINE) 5 MG tablet, Take 1 tablet (5 mg total) by mouth 3 (three) times daily. (Patient taking differently: Take 10 mg by mouth 3 (three) times daily.), Disp: 200 tablet, Rfl: 3   Multiple Vitamin (MULTIVITAMIN WITH MINERALS) TABS tablet, Take 1 tablet by mouth daily., Disp: , Rfl:    nystatin (MYCOSTATIN) 100000 UNIT/ML suspension, Take 5 mLs by mouth 4 (four) times daily., Disp: , Rfl:    Ondansetron HCl (ZOFRAN PO), 4 mg. Take 1 tablet on the tongue and allow to dissolve, Disp: , Rfl:    phenol (CHLORASEPTIC) 1.4 % LIQD, Use as directed 1 spray in the mouth or throat as needed for throat irritation / pain., Disp: , Rfl:    potassium chloride SA (KLOR-CON M) 20 MEQ tablet, Take 3 tablets (60 mEq total) by mouth 2 (two) times daily., Disp: 572  tablet, Rfl: 3   risperiDONE (RISPERDAL) 0.5 MG tablet, Take 1 tablet (0.5 mg total) by mouth 2 (two) times daily at 8 am and 4 pm., Disp: 60 tablet, Rfl: 2   simvastatin (ZOCOR) 10 MG tablet, TAKE 1 TABLET EVERY EVENING, Disp: 90 tablet, Rfl: 3   spironolactone (ALDACTONE) 25 MG tablet, TAKE 1 TABLET (25 MG TOTAL) BY MOUTH EVERY EVENING., Disp: 90 tablet, Rfl: 3   tirzepatide (MOUNJARO) 7.5 MG/0.5ML Pen, Inject 7.5 mg into the skin once a week., Disp: 2 mL, Rfl: 0   torsemide (DEMADEX) 100 MG tablet, Take 1 tablet (100 mg total) by mouth 2 (two) times daily., Disp: 180 tablet, Rfl: 3 Allergies  Allergen Reactions   Ceftin Anaphylaxis    Face and throat swell    Cefuroxime Axetil Anaphylaxis    Face and throat swell   Geodon [Ziprasidone Hcl] Hives   Lisinopril Other (See Comments)    angioedema   Cefuroxime     Other reaction(s): anaphylaxis   Sulfacetamide Sodium-Sulfur     Other reaction(s): itch   Allopurinol Nausea Only and Other (See Comments)    weakness   Ativan [Lorazepam] Itching   Sulfa Antibiotics Itching   Valium [Diazepam] Other (See Comments)    Patient states that diazepam doesn't relax, it has  the opposite effect.     Social History   Socioeconomic History   Marital status: Married    Spouse name: Not on file   Number of children: 2   Years of education: 47   Highest education level: Master's degree (e.g., MA, MS, MEng, MEd, MSW, MBA)  Occupational History   Occupation: retired  Tobacco Use   Smoking status: Some Days    Current packs/day: 0.00    Average packs/day: 0.1 packs/day for 26.0 years (2.6 ttl pk-yrs)    Types: Cigarettes    Start date: 03/24/1995    Last attempt to quit: 03/23/2021    Years since quitting: 2.4   Smokeless tobacco: Never  Vaping Use   Vaping status: Never Used  Substance and Sexual Activity   Alcohol use: No   Drug use: No   Sexual activity: Yes  Other Topics Concern   Not on file  Social History Narrative   Tobacco Use Cigarettes: Former Smoker, Quit in 2008   No Alcohol   No recreational drug use   Diet: Regular/Low Carb   Exercise: None   Occupation: disabled   Education: Company secretary, masters   Children: 2   Firearms: No   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      Social Drivers of Health   Financial Resource Strain: Not on file  Food Insecurity: No Food Insecurity (03/31/2023)   Hunger Vital Sign    Worried About Running Out of Food in the Last Year: Never true    Ran Out of Food in the Last Year: Never true  Transportation Needs: No Transportation Needs (03/31/2023)   PRAPARE - Administrator, Civil Service (Medical): No    Lack of Transportation (Non-Medical): No  Physical Activity: Not on file  Stress: No Stress Concern Present (08/11/2022)   [name] of Occupational Health - Occupational Stress Questionnaire    Feeling of Stress : Only a little  Social Connections: Not on file  Intimate Partner Violence: Not At Risk (07/27/2022)   Humiliation, Afraid, Rape, and Kick questionnaire    Fear of Current or  Ex-Partner: No    Emotionally Abused: No     Physically Abused: No    Sexually Abused: No    Physical Exam      Future Appointments  Date Time Provider Department Center  09/02/2023  3:30 PM Bridgett Larsson, LCSW [facility] None  09/09/2023  3:20 PM Arfeen, Phillips Grout, MD [facility] None  09/13/2023  7:10 AM [facility] DEVICE REMOTES [facility]  09/22/2023  1:45 PM Freddie Breech, DPM [facility]  10/13/2023  1:30 PM [facility] PA/NP [facility] None  12/13/2023  7:20 AM [facility] DEVICE REMOTES [facility]  01/11/2024  2:30 PM Patel, Donika K, DO [facility] None  03/13/2024  7:10 AM [facility] DEVICE REMOTES [facility]  06/12/2024  7:10 AM [facility] DEVICE REMOTES [facility]  09/11/2024  7:10 AM [facility] DEVICE REMOTES [facility]  12/11/2024  7:10 AM [facility] DEVICE REMOTES [facility]  03/12/2025  7:10 AM [facility] DEVICE REMOTES [facility]

## 2023-08-18 ENCOUNTER — Telehealth (HOSPITAL_COMMUNITY): Payer: Self-pay

## 2023-08-18 DIAGNOSIS — R051 Acute cough: Secondary | ICD-10-CM | POA: Diagnosis not present

## 2023-08-18 DIAGNOSIS — Z720 Tobacco use: Secondary | ICD-10-CM | POA: Diagnosis not present

## 2023-08-18 DIAGNOSIS — R0602 Shortness of breath: Secondary | ICD-10-CM | POA: Diagnosis not present

## 2023-08-18 NOTE — Telephone Encounter (Signed)
 5mg  Albuterol  80mg  Solumedrol

## 2023-08-18 NOTE — Telephone Encounter (Signed)
 Received a call from Dr. Chanetta Marshall who reports Bailley is being seen in her office today and she received 5mg  Albuterol and 80mg  Solumedrol for COPD exacerbation. She asked if I could follow up in the coming days to ensure her fluid buildup isn't worsened by the steroid. I will follow up.   [name], EMT-Paramedic [phone] 08/18/2023

## 2023-08-20 NOTE — Progress Notes (Signed)
 EPIC Encounter for ICM Monitoring  Patient Name: Rhonda [name] is a 73 y.o. female Date: 08/20/2023 Primary Care Physican: Shon Hale, MD Primary Cardiologist: Bensimhon Electrophysiologist: Rae Roam Pacing: 98.9%   06/01/2022 Weight: 193 lbs  07/08/2022 Weight: 183 lbs 08/10/2022 Weight: 185 lbs 11/25/2022 Weight: 180 lbs 12/30/2022 Weight: 184 lbs 04/26/2023 Weight: 214 lbs Per EMT note 07/14/2023 Weight: 226 lbs         Transmission results reviewed.               Optivol thoracic impedance suggesting possible dryness starting 3/2 but returning to baseline.    Prescribed:  [name] with paramedicine program assists with meds. Torsemide 100 mg 1 tablet(s) (100 mg total) by mouth twice a day  Potassium 20 meq 3 tablets (60 mEq total) by mouth 2 (twice) a day.   Metolazone 2.5mg  1 tablet by mouth once a week. Spironolactone 25 mg take 1 tablet by mouth every evening.   Labs: 07/28/2023 Creatinine 2.06, BUN 52, Potassium 3.5, Sodium 135, eGFR 25 07/14/2023 Creatinine 1.91, BUN 47, Potassium 3.3, Sodium 134, eGFR 28 06/09/2023 Creatinine 1.87, BUN 42, Potassium 3.5, Sodium 136, eGFR 29 05/27/2023 Creatinine 1.87, BUN 42, Potassium 3.5, Sodium 136, eGFR 29 05/13/2023 Creatinine 1.95, BUN 43, Potassium 3.6, Sodium 134, eGFR 27 05/06/2023 Creatinine 2.00, BUN 33, Potassium 4.3, Sodium 140, eGFR 26 03/04/2023 Creatinine 1.62, BUN 40, Potassium 4.4, Sodium 139, eGFR 34 A complete set of results can be found in Results Review.   Recommendations:  None   Follow-up plan: ICM clinic phone appointment on 09/20/2023.   91 day device clinic remote transmission 09/13/2023.     EP/Cardiology Office Visits:  10/13/2023 with HF clinic.  Recall 03/20/2023 with Dr Ladona Ridgel.     Copy of ICM check sent to Dr. Ladona Ridgel.   3 month ICM trend: 08/16/2023.    12-14 Month ICM trend:     Karie Soda, RN 08/20/2023 9:02 AM

## 2023-08-23 ENCOUNTER — Encounter: Payer: Self-pay | Admitting: Emergency Medicine

## 2023-08-23 ENCOUNTER — Other Ambulatory Visit (HOSPITAL_COMMUNITY): Payer: Self-pay

## 2023-08-23 NOTE — Progress Notes (Signed)
 Paramedicine Encounter    Patient ID: Rhonda [name], female    DOB: June 02, 1950, 73 y.o.   MRN: [id number]   Complaints- continued congestion and cough   Assessment- CAOX4, warm and dry ambulating around her kitchen, reporting to be feeling okay just still feeling congested with a cough. She has completed all meds given to her by her PCP. She only gained 2lbs since starting same. No lower leg edema noted. Lungs clear with a slight wheeze in the right lower lobe. She says she has not been smoking but I identified ashes in the trash can. I reminded her the importance of cessation, she says she is agreeable to nicotine patches. I will ask PCP.   Compliance with meds- missed three nighttime doses   Pill box filled- one week   Refills needed- buspirone   Meds changes since last visit-none    Social changes- none    VISIT SUMMARY- Arrived for home visit for Rhonda [name] who reports to be feeling well with no complaints aside from continued congestion and cough. Vitals and assessment as noted I reviewed meds- we discussed med compliance and smoking cessation. She verbalized understanding. I reviewed meds and filled pill box for one week. Refills called into [facility]. I will message provider to ensure it is safe for her to start nicotine patches. Home visit complete. I will see Rhonda [name] in one week.   BP (!) 140/70   Pulse 85   Resp 18   Wt 216 lb (98 kg)   SpO2 99%   BMI 42.18 kg/m  Weight yesterday-- didn't weigh  Last visit weight-214lbs     ACTION: Home visit completed     Patient Care Team: Shon Hale, MD as PCP - General (Family Medicine) Burna Sis, LCSW as Social Worker (Licensed Clinical Social Worker) [name], Noberto Retort, DO as Consulting Physician (Neurology) [name], Paramedic as Paramedic Chanetta Marshall, Meridee Score, MD as Attending Physician (Family Medicine) Clinton Gallant, RN as [facility] Care Management Bensimhon, Bevelyn Buckles, MD as  Consulting Physician (Cardiology) Lolly Mustache, Phillips Grout, MD as Consulting Physician (Psychiatry) Candelaria Stagers, DPM as Consulting Physician (Podiatry)  Patient Active Problem List   Diagnosis Date Noted   Mild dementia 04/20/2023   Diabetic neuropathy    Elevated troponin 12/02/2021   Acute left ankle pain 10/11/2021   Hyponatremia 10/08/2021   Decreased estrogen level 08/01/2021   Hyperparathyroidism 08/01/2021   Spondylolisthesis 08/01/2021   Varicose veins of bilateral lower extremities with other complications 08/01/2021   Lumbago without sciatica 04/30/2021   Abnormal gait 06/11/2020   Allergic rhinitis 06/11/2020   Asthma 06/11/2020   Benign intracranial hypertension 06/11/2020   Body mass index (BMI) 45.0-49.9, adult 06/11/2020   Bowel incontinence 06/11/2020   Cholelithiasis 06/11/2020   Chronic sinusitis 06/11/2020   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   Daytime somnolence 06/11/2020   Edema 06/11/2020   Family history of malignant neoplasm of gastrointestinal tract 06/11/2020   Gout 06/11/2020   Insomnia 06/11/2020   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   Lumbar spondylosis with myelopathy 06/11/2020   Macrocytosis 06/11/2020   History of colonic polyps 06/11/2020   Repeated falls 06/11/2020   Irritable bowel syndrome 01/04/2020   Spinal stenosis of lumbar region 01/03/2020   Bilateral leg weakness 08/01/2019   Type II diabetes mellitus 06/15/2019   Hypokalemia 11/08/2018   CKD (chronic kidney disease) stage 3, GFR 30-59 ml/min 11/08/2018   Orthostatic hypotension 07/28/2017   On home oxygen therapy    Migraines  Left bundle branch block    Hypothyroidism    Hypertension    Hyperlipidemia    Heart murmur    GERD (gastroesophageal reflux disease)    Exertional shortness of breath    Major depressive disorder    Back pain    Arthritis    Generalized anxiety disorder    Anemia    Chronic respiratory failure 09/14/2013   Biventricular ICD (implantable  cardioverter-defibrillator) in place 08/04/2013   Chronic systolic heart failure 10/27/2012   Endometrial polyp 01/20/2012   Malignant tumor of breast 03/26/2011   Vitamin D deficiency 03/26/2011    Current Outpatient Medications:    acetaminophen (TYLENOL) 500 MG tablet, Take 500 mg by mouth every 6 (six) hours as needed., Disp: , Rfl:    acetaZOLAMIDE (DIAMOX) 125 MG tablet, Take 1 tablet (125 mg total) by mouth daily., Disp: 90 tablet, Rfl: 3   allopurinol (ZYLOPRIM) 100 MG tablet, Take 0.5 tablets (50 mg total) by mouth daily., Disp: 45 tablet, Rfl: 1   azelastine (ASTELIN) 0.1 % nasal spray, Place 2 sprays into both nostrils 2 (two) times daily. Use in each nostril as directed, Disp: 30 mL, Rfl: 12   buPROPion (WELLBUTRIN XL) 150 MG 24 hr tablet, Take 1 tablet (150 mg total) by mouth daily., Disp: 30 tablet, Rfl: 2   carvedilol (COREG) 3.125 MG tablet, TAKE 1 TABLET (3.125 MG) BY MOUTH TWICE DAILY WITH MEALS, Disp: 180 tablet, Rfl: 3   citalopram (CELEXA) 20 MG tablet, Take 1 tablet (20 mg total) by mouth daily., Disp: 30 tablet, Rfl: 2   clonazePAM (KLONOPIN) 0.5 MG tablet, Take 1 tablet (0.5 mg total) by mouth 2 (two) times daily., Disp: 60 tablet, Rfl: 2   cyanocobalamin (VITAMIN B12) 1000 MCG tablet, Take 1,000 mcg by mouth daily., Disp: , Rfl:    desloratadine (CLARINEX) 5 MG tablet, Take 1 tablet (5 mg total) by mouth daily., Disp: 90 tablet, Rfl: 3   dicyclomine (BENTYL) 20 MG tablet, Take 20 mg by mouth 3 (three) times daily before meals., Disp: , Rfl:    famotidine (PEPCID) 20 MG tablet, Take 20 mg by mouth 2 (two) times daily., Disp: , Rfl:    fluticasone (FLONASE) 50 MCG/ACT nasal spray, Place 1 spray into both nostrils daily., Disp: , Rfl:    gabapentin (NEURONTIN) 100 MG capsule, TAKE 1 CAPSULE (100 MG TOTAL) BY MOUTH 2 (TWO) TIMES DAILY., Disp: 180 capsule, Rfl: 0   levothyroxine (SYNTHROID) 88 MCG tablet, TAKE 1 TABLET(88 MCG) BY MOUTH DAILY BEFORE BREAKFAST, Disp: 90  tablet, Rfl: 3   memantine (NAMENDA) 10 MG tablet, Take half tablet twice daily x 1 week, then increase to half tablet in the morning and 1 tablet at bedtime x 1 week, then 1 tablet BID, Disp: 60 tablet, Rfl: 11   metolazone (ZAROXOLYN) 2.5 MG tablet, TAKE 1 TABLET BY MOUTH ONCE A WEEK, Disp: 4 tablet, Rfl: 1   midodrine (PROAMATINE) 5 MG tablet, Take 1 tablet (5 mg total) by mouth 3 (three) times daily. (Patient taking differently: Take 10 mg by mouth 3 (three) times daily.), Disp: 200 tablet, Rfl: 3   Multiple Vitamin (MULTIVITAMIN WITH MINERALS) TABS tablet, Take 1 tablet by mouth daily., Disp: , Rfl:    nystatin (MYCOSTATIN) 100000 UNIT/ML suspension, Take 5 mLs by mouth 4 (four) times daily., Disp: , Rfl:    Ondansetron HCl (ZOFRAN PO), 4 mg. Take 1 tablet on the tongue and allow to dissolve, Disp: , Rfl:    phenol (  CHLORASEPTIC) 1.4 % LIQD, Use as directed 1 spray in the mouth or throat as needed for throat irritation / pain., Disp: , Rfl:    potassium chloride SA (KLOR-CON M) 20 MEQ tablet, Take 3 tablets (60 mEq total) by mouth 2 (two) times daily., Disp: 572 tablet, Rfl: 3   risperiDONE (RISPERDAL) 0.5 MG tablet, Take 1 tablet (0.5 mg total) by mouth 2 (two) times daily at 8 am and 4 pm., Disp: 60 tablet, Rfl: 2   simvastatin (ZOCOR) 10 MG tablet, TAKE 1 TABLET EVERY EVENING, Disp: 90 tablet, Rfl: 3   spironolactone (ALDACTONE) 25 MG tablet, TAKE 1 TABLET (25 MG TOTAL) BY MOUTH EVERY EVENING., Disp: 90 tablet, Rfl: 3   tirzepatide (MOUNJARO) 7.5 MG/0.5ML Pen, Inject 7.5 mg into the skin once a week., Disp: 2 mL, Rfl: 0   torsemide (DEMADEX) 100 MG tablet, Take 1 tablet (100 mg total) by mouth 2 (two) times daily., Disp: 180 tablet, Rfl: 3 Allergies  Allergen Reactions   Ceftin Anaphylaxis    Face and throat swell    Cefuroxime Axetil Anaphylaxis    Face and throat swell   Geodon [Ziprasidone Hcl] Hives   Lisinopril Other (See Comments)    angioedema   Cefuroxime     Other  reaction(s): anaphylaxis   Sulfacetamide Sodium-Sulfur     Other reaction(s): itch   Allopurinol Nausea Only and Other (See Comments)    weakness   Ativan [Lorazepam] Itching   Sulfa Antibiotics Itching   Valium [Diazepam] Other (See Comments)    Patient states that diazepam doesn't relax, it has the opposite effect.     Social History   Socioeconomic History   Marital status: Married    Spouse name: Not on file   Number of children: 2   Years of education: 48   Highest education level: Master's degree (e.g., MA, MS, MEng, MEd, MSW, MBA)  Occupational History   Occupation: retired  Tobacco Use   Smoking status: Some Days    Current packs/day: 0.00    Average packs/day: 0.1 packs/day for 26.0 years (2.6 ttl pk-yrs)    Types: Cigarettes    Start date: 03/24/1995    Last attempt to quit: 03/23/2021    Years since quitting: 2.4   Smokeless tobacco: Never  Vaping Use   Vaping status: Never Used  Substance and Sexual Activity   Alcohol use: No   Drug use: No   Sexual activity: Yes  Other Topics Concern   Not on file  Social History Narrative   Tobacco Use Cigarettes: Former Smoker, Quit in 2008   No Alcohol   No recreational drug use   Diet: Regular/Low Carb   Exercise: None   Occupation: disabled   Education: Company secretary, masters   Children: 2   Firearms: No   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      Social Drivers of Health   Financial Resource Strain: Not on file  Food Insecurity: No Food Insecurity (03/31/2023)   Hunger Vital Sign    Worried About Running Out of Food in the Last Year: Never true    Ran Out of Food in the Last Year: Never true  Transportation Needs: No Transportation Needs (03/31/2023)   PRAPARE - Administrator, Civil Service (Medical): No    Lack of Transportation (Non-Medical): No  Physical Activity: Not on file  Stress: No Stress Concern Present (08/11/2022)  [name] of  Occupational Health - Occupational Stress Questionnaire    Feeling of Stress : Only a little  Social Connections: Not on file  Intimate Partner Violence: Not At Risk (07/27/2022)   Humiliation, Afraid, Rape, and Kick questionnaire    Fear of Current or Ex-Partner: No    Emotionally Abused: No    Physically Abused: No    Sexually Abused: No    Physical Exam      Future Appointments  Date Time Provider Department Center  09/02/2023  3:30 PM Bridgett Larsson, LCSW [facility] None  09/09/2023  3:20 PM Arfeen, Phillips Grout, MD [facility] None  09/13/2023  7:10 AM [facility] DEVICE REMOTES [facility]  09/20/2023  7:30 AM [facility] DEVICE REMOTES [facility]  09/22/2023  1:45 PM Freddie Breech, DPM [facility]  09/23/2023  2:45 PM Jeanella Craze, NP [facility]  10/13/2023  1:30 PM [facility] PA/NP [facility] None  12/13/2023  7:20 AM [facility] DEVICE REMOTES [facility]  01/11/2024  2:30 PM Patel, Donika K, DO [facility] None  03/13/2024  7:10 AM [facility] DEVICE REMOTES [facility]  06/12/2024  7:10 AM [facility] DEVICE REMOTES [facility]  09/11/2024  7:10 AM [facility] DEVICE REMOTES [facility]  12/11/2024  7:10 AM [facility] DEVICE REMOTES [facility]  03/12/2025  7:10 AM [facility] DEVICE REMOTES [facility]

## 2023-08-24 ENCOUNTER — Other Ambulatory Visit (HOSPITAL_COMMUNITY): Payer: Self-pay | Admitting: Psychiatry

## 2023-08-24 DIAGNOSIS — F330 Major depressive disorder, recurrent, mild: Secondary | ICD-10-CM

## 2023-08-24 DIAGNOSIS — R4189 Other symptoms and signs involving cognitive functions and awareness: Secondary | ICD-10-CM

## 2023-08-31 ENCOUNTER — Other Ambulatory Visit (HOSPITAL_COMMUNITY): Payer: Self-pay

## 2023-08-31 NOTE — Progress Notes (Signed)
 Paramedicine Encounter    Patient ID: Rhonda [name], female    DOB: February 22, 1951, 73 y.o.   MRN: [id number]   Complaints- headache   Assessment- CAOX4, warm and dry reporting to be feeling okay just having a headache   Compliance with meds-missed one noon dose   Pill box filled- for one week   Refills needed-  -wellbutrin -celexa -levothyroxine    Meds changes since last visit- None     Social changes-  None    VISIT SUMMARY- Arrived for home visit for Rhonda [name] who reports to be feeling okay but having a headache. I obtained vitals as noted. Lungs clear, assessment noted some lower left leg swelling. I reviewed meds and confirmed same pill box filled for one week. Rhonda [name] has lost 4 lbs from last week to this week- mounjaro seems to be working well she has  has no GI side effects noted. Refills will be called into [facility]. Appointments reviewed and confirmed and wrote down. Home visit complete. I will see Rhonda [name] in one week.   BP 110/62   Pulse 78   Resp 16   Wt 212 lb (96.2 kg)   SpO2 98%   BMI 41.40 kg/m  Weight yesterday-- didn't weigh  Last visit weight-- 216lbs      ACTION: Home visit completed     Patient Care Team: Shon Hale, MD as PCP - General (Family Medicine) Burna Sis, LCSW as Social Worker (Licensed Clinical Social Worker) [name], Noberto Retort, DO as Consulting Physician (Neurology) [name], Paramedic as Paramedic Chanetta Marshall, Meridee Score, MD as Attending Physician (Family Medicine) Clinton Gallant, RN as [facility] Care Management Bensimhon, Bevelyn Buckles, MD as Consulting Physician (Cardiology) Cleotis Nipper, MD as Consulting Physician (Psychiatry) Candelaria Stagers, DPM as Consulting Physician (Podiatry)  Patient Active Problem List   Diagnosis Date Noted   Mild dementia 04/20/2023   Diabetic neuropathy    Elevated troponin 12/02/2021   Acute left ankle pain 10/11/2021   Hyponatremia 10/08/2021   Decreased  estrogen level 08/01/2021   Hyperparathyroidism 08/01/2021   Spondylolisthesis 08/01/2021   Varicose veins of bilateral lower extremities with other complications 08/01/2021   Lumbago without sciatica 04/30/2021   Abnormal gait 06/11/2020   Allergic rhinitis 06/11/2020   Asthma 06/11/2020   Benign intracranial hypertension 06/11/2020   Body mass index (BMI) 45.0-49.9, adult 06/11/2020   Bowel incontinence 06/11/2020   Cholelithiasis 06/11/2020   Chronic sinusitis 06/11/2020   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   Daytime somnolence 06/11/2020   Edema 06/11/2020   Family history of malignant neoplasm of gastrointestinal tract 06/11/2020   Gout 06/11/2020   Insomnia 06/11/2020   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   Lumbar spondylosis with myelopathy 06/11/2020   Macrocytosis 06/11/2020   History of colonic polyps 06/11/2020   Repeated falls 06/11/2020   Irritable bowel syndrome 01/04/2020   Spinal stenosis of lumbar region 01/03/2020   Bilateral leg weakness 08/01/2019   Type II diabetes mellitus 06/15/2019   Hypokalemia 11/08/2018   CKD (chronic kidney disease) stage 3, GFR 30-59 ml/min 11/08/2018   Orthostatic hypotension 07/28/2017   On home oxygen therapy    Migraines    Left bundle branch block    Hypothyroidism    Hypertension    Hyperlipidemia    Heart murmur    GERD (gastroesophageal reflux disease)    Exertional shortness of breath    Major depressive disorder    Back pain    Arthritis    Generalized anxiety disorder  Anemia    Chronic respiratory failure 09/14/2013   Biventricular ICD (implantable cardioverter-defibrillator) in place 08/04/2013   Chronic systolic heart failure 10/27/2012   Endometrial polyp 01/20/2012   Malignant tumor of breast 03/26/2011   Vitamin D deficiency 03/26/2011    Current Outpatient Medications:    acetaminophen (TYLENOL) 500 MG tablet, Take 500 mg by mouth every 6 (six) hours as needed., Disp: , Rfl:    acetaZOLAMIDE (DIAMOX) 125 MG  tablet, Take 1 tablet (125 mg total) by mouth daily., Disp: 90 tablet, Rfl: 3   allopurinol (ZYLOPRIM) 100 MG tablet, Take 0.5 tablets (50 mg total) by mouth daily., Disp: 45 tablet, Rfl: 1   azelastine (ASTELIN) 0.1 % nasal spray, Place 2 sprays into both nostrils 2 (two) times daily. Use in each nostril as directed, Disp: 30 mL, Rfl: 12   buPROPion (WELLBUTRIN XL) 150 MG 24 hr tablet, Take 1 tablet (150 mg total) by mouth daily., Disp: 30 tablet, Rfl: 2   carvedilol (COREG) 3.125 MG tablet, TAKE 1 TABLET (3.125 MG) BY MOUTH TWICE DAILY WITH MEALS, Disp: 180 tablet, Rfl: 3   citalopram (CELEXA) 20 MG tablet, Take 1 tablet (20 mg total) by mouth daily., Disp: 30 tablet, Rfl: 2   clonazePAM (KLONOPIN) 0.5 MG tablet, Take 1 tablet (0.5 mg total) by mouth 2 (two) times daily., Disp: 60 tablet, Rfl: 2   cyanocobalamin (VITAMIN B12) 1000 MCG tablet, Take 1,000 mcg by mouth daily., Disp: , Rfl:    desloratadine (CLARINEX) 5 MG tablet, Take 1 tablet (5 mg total) by mouth daily., Disp: 90 tablet, Rfl: 3   dicyclomine (BENTYL) 20 MG tablet, Take 20 mg by mouth 3 (three) times daily before meals., Disp: , Rfl:    famotidine (PEPCID) 20 MG tablet, Take 20 mg by mouth 2 (two) times daily., Disp: , Rfl:    fluticasone (FLONASE) 50 MCG/ACT nasal spray, Place 1 spray into both nostrils daily., Disp: , Rfl:    gabapentin (NEURONTIN) 100 MG capsule, TAKE 1 CAPSULE (100 MG TOTAL) BY MOUTH 2 (TWO) TIMES DAILY., Disp: 180 capsule, Rfl: 0   levothyroxine (SYNTHROID) 88 MCG tablet, TAKE 1 TABLET(88 MCG) BY MOUTH DAILY BEFORE BREAKFAST, Disp: 90 tablet, Rfl: 3   memantine (NAMENDA) 10 MG tablet, Take half tablet twice daily x 1 week, then increase to half tablet in the morning and 1 tablet at bedtime x 1 week, then 1 tablet BID, Disp: 60 tablet, Rfl: 11   metolazone (ZAROXOLYN) 2.5 MG tablet, TAKE 1 TABLET BY MOUTH ONCE A WEEK, Disp: 4 tablet, Rfl: 1   midodrine (PROAMATINE) 5 MG tablet, Take 1 tablet (5 mg total) by mouth  3 (three) times daily. (Patient taking differently: Take 10 mg by mouth 3 (three) times daily.), Disp: 200 tablet, Rfl: 3   Multiple Vitamin (MULTIVITAMIN WITH MINERALS) TABS tablet, Take 1 tablet by mouth daily., Disp: , Rfl:    nystatin (MYCOSTATIN) 100000 UNIT/ML suspension, Take 5 mLs by mouth 4 (four) times daily., Disp: , Rfl:    Ondansetron HCl (ZOFRAN PO), 4 mg. Take 1 tablet on the tongue and allow to dissolve, Disp: , Rfl:    phenol (CHLORASEPTIC) 1.4 % LIQD, Use as directed 1 spray in the mouth or throat as needed for throat irritation / pain., Disp: , Rfl:    potassium chloride SA (KLOR-CON M) 20 MEQ tablet, Take 3 tablets (60 mEq total) by mouth 2 (two) times daily., Disp: 572 tablet, Rfl: 3   risperiDONE (RISPERDAL) 0.5 MG tablet,  Take 1 tablet (0.5 mg total) by mouth 2 (two) times daily at 8 am and 4 pm., Disp: 60 tablet, Rfl: 2   simvastatin (ZOCOR) 10 MG tablet, TAKE 1 TABLET EVERY EVENING, Disp: 90 tablet, Rfl: 3   spironolactone (ALDACTONE) 25 MG tablet, TAKE 1 TABLET (25 MG TOTAL) BY MOUTH EVERY EVENING., Disp: 90 tablet, Rfl: 3   tirzepatide (MOUNJARO) 7.5 MG/0.5ML Pen, Inject 7.5 mg into the skin once a week., Disp: 2 mL, Rfl: 0   torsemide (DEMADEX) 100 MG tablet, Take 1 tablet (100 mg total) by mouth 2 (two) times daily., Disp: 180 tablet, Rfl: 3 Allergies  Allergen Reactions   Ceftin Anaphylaxis    Face and throat swell    Cefuroxime Axetil Anaphylaxis    Face and throat swell   Geodon [Ziprasidone Hcl] Hives   Lisinopril Other (See Comments)    angioedema   Cefuroxime     Other reaction(s): anaphylaxis   Sulfacetamide Sodium-Sulfur     Other reaction(s): itch   Allopurinol Nausea Only and Other (See Comments)    weakness   Ativan [Lorazepam] Itching   Sulfa Antibiotics Itching   Valium [Diazepam] Other (See Comments)    Patient states that diazepam doesn't relax, it has the opposite effect.     Social History   Socioeconomic History   Marital status:  Married    Spouse name: Not on file   Number of children: 2   Years of education: 74   Highest education level: Master's degree (e.g., MA, MS, MEng, MEd, MSW, MBA)  Occupational History   Occupation: retired  Tobacco Use   Smoking status: Some Days    Current packs/day: 0.00    Average packs/day: 0.1 packs/day for 26.0 years (2.6 ttl pk-yrs)    Types: Cigarettes    Start date: 03/24/1995    Last attempt to quit: 03/23/2021    Years since quitting: 2.4   Smokeless tobacco: Never  Vaping Use   Vaping status: Never Used  Substance and Sexual Activity   Alcohol use: No   Drug use: No   Sexual activity: Yes  Other Topics Concern   Not on file  Social History Narrative   Tobacco Use Cigarettes: Former Smoker, Quit in 2008   No Alcohol   No recreational drug use   Diet: Regular/Low Carb   Exercise: None   Occupation: disabled   Education: Company secretary, masters   Children: 2   Firearms: No   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      Social Drivers of Health   Financial Resource Strain: Not on file  Food Insecurity: No Food Insecurity (03/31/2023)   Hunger Vital Sign    Worried About Running Out of Food in the Last Year: Never true    Ran Out of Food in the Last Year: Never true  Transportation Needs: No Transportation Needs (03/31/2023)   PRAPARE - Administrator, Civil Service (Medical): No    Lack of Transportation (Non-Medical): No  Physical Activity: Not on file  Stress: No Stress Concern Present (08/11/2022)   [name] of Occupational Health - Occupational Stress Questionnaire    Feeling of Stress : Only a little  Social Connections: Not on file  Intimate Partner Violence: Not At Risk (07/27/2022)   Humiliation, Afraid, Rape, and Kick questionnaire    Fear of Current or Ex-Partner: No    Emotionally Abused: No  Physically Abused: No    Sexually Abused: No    Physical Exam      Future  Appointments  Date Time Provider Department Center  09/02/2023  3:30 PM [name] D, LCSW [facility] None  09/03/2023  3:00 PM Clinton Gallant, RN [facility] None  09/09/2023  3:20 PM Arfeen, Phillips Grout, MD [facility] None  09/13/2023  7:10 AM [facility] DEVICE REMOTES [facility]  09/20/2023  7:30 AM [facility] DEVICE REMOTES [facility]  09/22/2023  1:45 PM Freddie Breech, DPM [facility]  09/23/2023  2:45 PM Jeanella Craze, NP [facility]  10/13/2023  1:30 PM [facility] PA/NP [facility] None  12/13/2023  7:20 AM [facility] DEVICE REMOTES [facility]  01/11/2024  2:30 PM Patel, Donika K, DO [facility] None  03/13/2024  7:10 AM [facility] DEVICE REMOTES [facility]  06/12/2024  7:10 AM [facility] DEVICE REMOTES [facility]  09/11/2024  7:10 AM [facility] DEVICE REMOTES [facility]  12/11/2024  7:10 AM [facility] DEVICE REMOTES [facility]  03/12/2025  7:10 AM [facility] DEVICE REMOTES [facility]

## 2023-09-02 ENCOUNTER — Ambulatory Visit: Payer: Self-pay | Admitting: Licensed Clinical Social Worker

## 2023-09-02 NOTE — Patient Instructions (Signed)
 Visit Information  Thank you for taking time to visit with me today. Please don't hesitate to contact me if I can be of assistance to you before our next scheduled appointment.  Our next appointment is by telephone on 5/1 at 1 PM Please call the care guide team at [phone] if you need to cancel or reschedule your appointment.   Following is a copy of your care plan:   Goals Addressed             This Visit's Progress    LCSW VBCI Social Work Care Plan   On track    Problems:   Level of Care Concerns:Inability to perform ADL's independently  CSW Clinical Goal(s):   Over the next 90 days the Patient will attend all scheduled medical appointments as evidenced by patient report and care team review of appointment completion in EMR:   explore community resource options for unmet needs related to Stress Continue to participate in [name] to strengthen support with management of chronic health conditions .  Interventions:  Level of Care Concerns in a patient with CHF, Dementia, and DMII Current level of care: Home with other family or significant other(s): spouse  Evaluation of patient's unmet needs in current living environment ADL's Assessed needs, level of care concerns, how currently meeting needs and barriers to care Discuss community support options (Programs to provide personal care services) Discussed private pay options for personal care needs (Family are aware of resources) DSS in-home aide program:(Shondra with DSS visits twice a week for 2 hrs) Solution-Focused Strategies employed:  Facility  Patient is not interested in placement at this time  Advance Directive Active listening / Reflection utilized  Consideration on in-home help encouraged : options discussed  Patient Goals/Self-Care Activities:  Call Department of Social Services [phone] to apply for Medicaid. Continue taking your medication as prescribed.   Follow up on insurance options  regarding aid benefits through [facility].  Plan:   Telephone follow up appointment with care management team member scheduled for:  3 weeks     COMPLETED: Obtain Supportive Resources       Activities and task to complete in order to accomplish goals.   Keep all upcoming appointments discussed today Continue with compliance of taking medication prescribed by Doctor Implement healthy coping skills discussed to assist with management of symptoms          Please call the Suicide and Crisis Lifeline: 988 call 911 if you are experiencing a Mental Health or Behavioral Health Crisis or need someone to talk to.  The patient verbalized understanding of instructions, educational materials, and care plan provided today and DECLINED offer to receive copy of patient instructions, educational materials, and care plan.   Windy Fast [facility]  [facility], [facility] Clinical Social Worker Direct Dial: [phone]  Fax: [phone] Website: [email or web address] 4:27 PM

## 2023-09-02 NOTE — Patient Outreach (Signed)
 Complex Care Management   Visit Note  09/02/2023  Name:  Rhonda [name] MRN: [id number] DOB: April 26, 1951  Situation: Referral received for Complex Care Management related to Heart Failure and Dementia I obtained verbal consent from Patient.  Visit completed with Patient and Spouse  on the phone  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/2013   Body mass index (BMI) 45.0-49.9, adult 06/11/2020   Bowel incontinence 06/11/2020   Cardiomyopathy    Cholelithiasis 06/11/2020   Chronic respiratory failure 09/14/2013   Chronic sinusitis 06/11/2020   Chronic systolic heart failure 10/27/2012   a) NICM b) ECHO (03/2013) EF 20-25% c) ECHO (09/2013) EF 45-50%, grade I DD   CKD (chronic kidney disease) stage 3, GFR 30-59 ml/min 11/08/2018   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   Complication of anesthesia    History of low blood pressure after surgery; attributed to lying flat   Daytime somnolence 06/11/2020   Decreased estrogen level 08/01/2021   Diabetic neuropathy    Diarrhea of presumed infectious origin 06/11/2020   Edema 06/11/2020   Elevated troponin 12/02/2021   Endometrial polyp 01/20/2012   Exertional shortness of breath    Generalized anxiety disorder    GERD (gastroesophageal reflux disease)    Gout 06/11/2020   Heart murmur    History of colonic polyps 06/11/2020   History of fall 06/11/2020   Hyperlipidemia    Hyperparathyroidism 08/01/2021   Hypertension    Hypokalemia 11/08/2018   Hyponatremia 10/08/2021   Hypothyroidism    Insomnia 06/11/2020   Intractable  nausea and vomiting 10/08/2021   Irritable bowel syndrome 01/04/2020   Left bundle branch block    s/p CRT-D (04/2013)   Lumbago without sciatica 04/30/2021   Lumbar spondylosis with myelopathy 06/11/2020   Macrocytosis 06/11/2020   Major depressive disorder    Malignant tumor of breast 03/26/2011   Left; completed chemotherapy and radiation treatments   Migraines    Mild dementia 04/20/2023   On home oxygen therapy    "2L suppose to be q night" (05/03/2013)   Orthostatic hypotension 07/28/2017   Spinal stenosis of lumbar region 01/03/2020   Spondylolisthesis 08/01/2021   SVD (spontaneous vaginal delivery)    x 2   Type II diabetes mellitus 06/15/2019   Varicose veins of bilateral lower extremities with other complications 08/01/2021   Vitamin D deficiency 03/26/2011    Assessment: Patient Reported Symptoms:  Cognitive Alert and oriented to person, place, and time  Neurological No symptoms reported    HEENT No symptoms reported    Cardiovascular No symptoms reported    Respiratory No symptoms reported    Endocrine No symptoms reported    Gastrointestinal No symptoms reported    Genitourinary No symptoms reported    Integumentary No symptoms reported    Musculoskeletal No symptoms reported    Psychosocial No symptoms reported     There were no vitals filed for this visit.  Medications Reviewed Today     Reviewed by Bridgett Larsson, LCSW (Social Worker) on 09/02/23 at 1552  Med List Status: <None>  Medication Order Taking? Sig Documenting Provider Last Dose Status Informant  acetaminophen (TYLENOL) 500 MG tablet [id number] Yes Take 500 mg by mouth every 6 (six) hours as needed. [provider] Taking Active   acetaZOLAMIDE (DIAMOX) 125 MG tablet [id number] Yes Take 1 tablet (125 mg total) by mouth daily. Glendale Chard, DO Taking Active Self, Pharmacy Records  allopurinol (ZYLOPRIM) 100 MG tablet [id number] Yes Take 0.5 tablets (50 mg total) by mouth  daily. [place] Mel, Anderson Malta, FNP Taking Active   azelastine (ASTELIN) 0.1 % nasal spray [id number] Yes Place 2 sprays into both nostrils 2 (two) times daily. Use in each nostril as directed Ashok Croon, MD Taking Active   buPROPion (WELLBUTRIN XL) 150 MG 24 hr tablet [id number] Yes Take 1 tablet (150 mg total) by mouth daily. Cleotis Nipper, MD Taking Active   carvedilol (COREG) 3.125 MG tablet [id number] Yes TAKE 1 TABLET (3.125 MG) BY MOUTH TWICE DAILY WITH MEALS Andrey Farmer, [place] Taking Active   citalopram (CELEXA) 20 MG tablet [id number] Yes Take 1 tablet (20 mg total) by mouth daily. Cleotis Nipper, MD Taking Active   clonazePAM [facility]) 0.5 MG tablet [id number] Yes Take 1 tablet (0.5 mg total) by mouth 2 (two) times daily. Cleotis Nipper, MD Taking Active   cyanocobalamin (VITAMIN B12) 1000 MCG tablet [id number] Yes Take 1,000 mcg by mouth daily. [provider] Taking Active Self, Pharmacy Records  desloratadine (CLARINEX) 5 MG tablet [id number] Yes Take 1 tablet (5 mg total) by mouth daily. Ashok Croon, MD Taking Active            Med Note Curley Spice, [facility] Feb 15, 2023  2:56 PM)    dicyclomine (BENTYL) 20 MG tablet [id number] Yes Take 20 mg by mouth 3 (three) times daily before meals. [provider] Taking Active Self, Pharmacy Records  famotidine (PEPCID) 20 MG tablet [id number] Yes Take 20 mg by mouth 2 (two) times daily. [provider] Taking Active            Med Note Rosalio Macadamia, JENNA B   Wed Jul 14, 2023 10:08 AM) As needed   fluticasone (FLONASE) 50 MCG/ACT nasal spray [id number] Yes Place 1 spray into both nostrils daily. [provider] Taking Active Self, Pharmacy Records  gabapentin (NEURONTIN) 100 MG capsule [id number] Yes TAKE 1 CAPSULE (100 MG TOTAL) BY MOUTH 2 (TWO) TIMES DAILY. Glendale Chard, DO Taking Active   levothyroxine (SYNTHROID) 88 MCG tablet [id number] Yes TAKE 1 TABLET(88 MCG) BY MOUTH DAILY BEFORE Bari Mantis, MD Taking Active Self, Pharmacy Records  memantine [facility]) 10 MG tablet [id number] Yes Take half tablet twice daily x 1 week, then increase to half tablet in the morning and 1 tablet at bedtime x 1 week, then 1 tablet BID Nita Sickle K, DO Taking Active   metolazone (ZAROXOLYN) 2.5 MG tablet [id number] Yes TAKE 1 TABLET BY MOUTH ONCE A WEEK Bensimhon, Bevelyn Buckles, MD Taking Active   midodrine (PROAMATINE) 5 MG tablet [id number] Yes Take 1 tablet (5 mg total) by mouth 3 (three) times daily.  Patient taking differently: Take 10 mg by mouth 3 (three) times daily.   Robbie Lis M, PA-C Taking Active   Multiple Vitamin (MULTIVITAMIN WITH MINERALS) TABS tablet [id number] Yes Take 1 tablet by mouth daily. [provider] Taking Active Self, Pharmacy Records  nystatin (MYCOSTATIN) 100000 UNIT/ML suspension [id number] Yes Take 5 mLs by mouth 4 (four) times daily. [provider] Taking Active  Ondansetron HCl (ZOFRAN PO) [id number] Yes 4 mg. Take 1 tablet on the tongue and allow to dissolve [provider] Taking Active            Med Note Curley Spice, [facility] K   Mon Jan 04, 2023 12:24 PM) Taking PRN   phenol (CHLORASEPTIC) 1.4 % LIQD [id number] Yes Use as directed 1 spray in the mouth or throat as needed for throat irritation / pain. [provider] Taking Active   potassium chloride SA (KLOR-CON M) 20 MEQ tablet [id number] Yes Take 3 tablets (60 mEq total) by mouth 2 (two) times daily. Dorthula Nettles, DO Taking Active   risperiDONE (RISPERDAL) 0.5 MG tablet [id number] Yes Take 1 tablet (0.5 mg total) by mouth 2 (two) times daily at 8 am and 4 pm. Cleotis Nipper, MD Taking Active   simvastatin (ZOCOR) 10 MG tablet [id number] Yes TAKE 1 TABLET EVERY EVENING Bensimhon, Bevelyn Buckles, MD Taking Active   spironolactone (ALDACTONE) 25 MG tablet [id number] Yes TAKE 1 TABLET (25 MG TOTAL) BY MOUTH EVERY EVENING. Bensimhon, Bevelyn Buckles, MD Taking Active   tirzepatide  [facility]) 7.5 MG/0.5ML Pen [id number] Yes Inject 7.5 mg into the skin once a week. Bensimhon, Bevelyn Buckles, MD Taking Active   torsemide [facility]) 100 MG tablet [id number] Yes Take 1 tablet (100 mg total) by mouth 2 (two) times daily. [place], Anderson Malta, FNP Taking Active             Recommendation:   Continue utilizing strategies discussed to assist with symptom management  Follow Up Plan:   Telephone follow-up 3 weeks  [name], LCSW [place]  [facility], [facility] Clinical Social Worker Direct Dial: [phone]  Fax: [phone] Website: [email or web address] 4:27 PM

## 2023-09-03 ENCOUNTER — Other Ambulatory Visit: Payer: Self-pay

## 2023-09-03 ENCOUNTER — Other Ambulatory Visit: Payer: Self-pay | Admitting: *Deleted

## 2023-09-03 NOTE — Patient Outreach (Signed)
 Complex Care Management   Visit Note  09/03/2023  Name:  Rhonda [name] MRN: [id number] DOB: 06-29-1950  Situation: Referral received for Complex Care Management related to Stroke, Diabetes with Complications, Dementia, and home care services  I obtained verbal consent from Caregiver.  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   Body mass index (BMI) 45.0-49.9, adult 06/11/2020   Bowel incontinence 06/11/2020   Cardiomyopathy    Cholelithiasis 06/11/2020   Chronic respiratory failure 09/14/2013   Chronic sinusitis 06/11/2020   Chronic systolic heart failure 10/27/2012   a) NICM b) ECHO (03/2013) EF 20-25% c) ECHO (09/2013) EF 45-50%, grade I DD   CKD (chronic kidney disease) stage 3, GFR 30-59 ml/min 11/08/2018   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   Complication of anesthesia    History of low blood pressure after surgery; attributed to lying flat   Daytime somnolence 06/11/2020   Decreased estrogen level 08/01/2021   Diabetic neuropathy    Diarrhea of presumed infectious origin 06/11/2020   Edema 06/11/2020   Elevated troponin 12/02/2021   Endometrial polyp 01/20/2012   Exertional shortness of breath    Generalized anxiety disorder    GERD (gastroesophageal reflux disease)    Gout 06/11/2020   Heart murmur    History of colonic polyps 06/11/2020   History of fall 06/11/2020   Hyperlipidemia    Hyperparathyroidism 08/01/2021   Hypertension    Hypokalemia 11/08/2018   Hyponatremia 10/08/2021   Hypothyroidism     Insomnia 06/11/2020   Intractable nausea and vomiting 10/08/2021   Irritable bowel syndrome 01/04/2020   Left bundle branch block    s/p CRT-D (04/2013)   Lumbago without sciatica 04/30/2021   Lumbar spondylosis with myelopathy 06/11/2020   Macrocytosis 06/11/2020   Major depressive disorder    Malignant tumor of breast 03/26/2011   Left; completed chemotherapy and radiation treatments   Migraines    Mild dementia 04/20/2023   On home oxygen therapy    "2L suppose to be q night" (05/03/2013)   Orthostatic hypotension 07/28/2017   Spinal stenosis of lumbar region 01/03/2020   Spondylolisthesis 08/01/2021   SVD (spontaneous vaginal delivery)    x 2   Type II diabetes mellitus 06/15/2019   Varicose veins of bilateral lower extremities with other complications 08/01/2021   Vitamin D deficiency 03/26/2011    Assessment: Patient Reported Symptoms:  Cognitive Cognitive Status: Able to follow simple commands, Struggling with memory recall, Confused or disoriented      Neurological   Neurological Management Strategies: Adequate rest, Medication therapy, Routine screening Neurological Self-Management Outcome: 3 (uncertain)  HEENT   HEENT Conditions: Vision problem(s) Vision Problems: blindness/vision loss HEENT Management Strategies: Adequate rest, Medical device, Routine screening HEENT Self-Management Outcome: 3 (uncertain) Vision problem(s)  Cardiovascular Cardiovascular Symptoms Reported: Other: Other Cardiovascular Symptoms: pain in both legs hx og varicose veins  & neuropathy Does patient have uncontrolled Hypertension?: No Cardiovascular Conditions: Heart failure, Coronary artery disease Cardiovascular Management Strategies: Adequate rest, Routine screening, Medication therapy Weight:  212 lb (96.2 kg) Cardiovascular Self-Management Outcome: 2 (bad)  Respiratory Respiratory Symptoms Reported: No symptoms reported Respiratory Conditions: Asthma, Seasonal  allergies Respiratory Self-Management Outcome: 4 (good) Respiratory Comment: sinsusitis  Endocrine Patient reports the following symptoms related to hypoglycemia or hyperglycemia : No symptoms reported Endocrine Conditions: Thyroid disorder, Diabetes Endocrine Management Strategies: Adequate rest, Medication therapy, Routine screening, Medical device Endocrine Self-Management Outcome: 4 (good)  Gastrointestinal Gastrointestinal Symptoms Reported: No symptoms reported Gastrointestinal Conditions: Irritable bowel syndrome, Reflux/heartburn Gastrointestinal Management Strategies: Adequate rest, Fluid modification, Incontinence garment/pad, Medication therapy, Diet modification Gastrointestinal Self-Management Outcome: 4 (good) Nutrition Risk Screen (CP): No indicators present  Genitourinary   Genitourinary Management Strategies: Adequate rest, Coping strategies, Incontinence garment/pad Genitourinary Self-Management Outcome: 4 (good)  Integumentary Integumentary Symptoms Reported: No symptoms reported Skin Management Strategies: Adequate rest, Coping strategies, Routine screening Skin Self-Management Outcome: 4 (good)  Musculoskeletal   Musculoskeletal Conditions: Osteoarthritis, Back pain, Joint pain, Unsteady gait, Other Other Musculoskeletal Conditions: falls Musculoskeletal Management Strategies: Adequate rest, Medical device, Routine screening, Medication therapy Musculoskeletal Self-Management Outcome: 3 (uncertain) Falls in the past year?: Yes Number of falls in past year: 2 or more Was there an injury with Fall?: Yes Fall Risk Category Calculator: 3 Patient Fall Risk Level: High Fall Risk Patient at Risk for Falls Due to: History of fall(s), Impaired balance/gait, Impaired mobility Fall risk Follow up: Falls evaluation completed  Psychosocial Psychosocial Symptoms Reported: No symptoms reported Behavioral Health Conditions: Depression, Dementia Behavioral Management Strategies:  Adequate rest, Coping strategies, Support system, Medication therapy Behavioral Health Self-Management Outcome: 4 (good) Major Change/Loss/Stressor/Fears (CP): Medical condition, self Techniques to Cope with Loss/Stress/Change: Diversional activities, Medication, Spiritual practice(s) Quality of Family Relationships: helpful, supportive Do you feel physically threatened by others?: No      09/03/2023    7:05 PM  Depression screen PHQ 2/9  Decreased Interest 0  Down, Depressed, Hopeless 1  PHQ - 2 Score 1    There were no vitals filed for this visit.  Medications Reviewed Today     Reviewed by Clinton Gallant, RN (Registered Nurse) on 09/03/23 at 1848  Med List Status: <None>   Medication Order Taking? Sig Documenting Provider Last Dose Status Informant  acetaminophen (TYLENOL) 500 MG tablet [id number] No Take 500 mg by mouth every 6 (six) hours as needed. [provider] Taking Active   acetaZOLAMIDE (DIAMOX) 125 MG tablet [id number] No Take 1 tablet (125 mg total) by mouth daily. Glendale Chard, DO Taking Active Self, Pharmacy Records  allopurinol (ZYLOPRIM) 100 MG tablet [id number] No Take 0.5 tablets (50 mg total) by mouth daily. [place], Anderson Malta, FNP Taking Active   azelastine (ASTELIN) 0.1 % nasal spray [id number] No Place 2 sprays into both nostrils 2 (two) times daily. Use in each nostril as directed Ashok Croon, MD Taking Active   buPROPion (WELLBUTRIN XL) 150 MG 24 hr tablet [id number] No Take 1 tablet (150 mg total) by mouth daily. Cleotis Nipper, MD Taking Active   carvedilol (COREG) 3.125 MG tablet [id number] No TAKE 1 TABLET (3.125 MG) BY MOUTH TWICE DAILY WITH MEALS Andrey Farmer, [place] Taking Active   citalopram (CELEXA) 20 MG tablet [id number] No Take 1 tablet (20 mg total) by mouth daily. Cleotis Nipper, MD Taking Active   clonazePAM (KLONOPIN) 0.5 MG tablet [id number] No Take 1 tablet (0.5 mg total) by mouth 2 (two) times daily. Cleotis Nipper, MD  Taking Active   cyanocobalamin (VITAMIN B12) 1000 MCG tablet [id number] No Take 1,000 mcg by  mouth daily. [provider] Taking Active Self, Pharmacy Records  desloratadine (CLARINEX) 5 MG tablet [id number] No Take 1 tablet (5 mg total) by mouth daily. Ashok Croon, MD Taking Active            Med Note Curley Spice, [facility] Feb 15, 2023  2:56 PM)    dicyclomine (BENTYL) 20 MG tablet [id number] No Take 20 mg by mouth 3 (three) times daily before meals. [provider] Taking Active Self, Pharmacy Records  famotidine (PEPCID) 20 MG tablet [id number] No Take 20 mg by mouth 2 (two) times daily. [provider] Taking Active            Med Note Rosalio Macadamia, JENNA B   Wed Jul 14, 2023 10:08 AM) As needed   fluticasone (FLONASE) 50 MCG/ACT nasal spray [id number] No Place 1 spray into both nostrils daily. [provider] Taking Active Self, Pharmacy Records  gabapentin (NEURONTIN) 100 MG capsule [id number] No TAKE 1 CAPSULE (100 MG TOTAL) BY MOUTH 2 (TWO) TIMES DAILY. [place]endale Chard, DO Taking Active   levothyroxine (SYNTHROID) 88 MCG tablet [id number] No TAKE 1 TABLET(88 MCG) BY MOUTH DAILY BEFORE Bari Mantis, MD Taking Active Self, Pharmacy Records  memantine [facility]) 10 MG tablet [id number] No Take half tablet twice daily x 1 week, then increase to half tablet in the morning and 1 tablet at bedtime x 1 week, then 1 tablet BID Nita Sickle K, DO Taking Active   metolazone (ZAROXOLYN) 2.5 MG tablet [id number] No TAKE 1 TABLET BY MOUTH ONCE A WEEK Bensimhon, Bevelyn Buckles, MD Taking Active   midodrine (PROAMATINE) 5 MG tablet [id number] No Take 1 tablet (5 mg total) by mouth 3 (three) times daily.  Patient taking differently: Take 10 mg by mouth 3 (three) times daily.   Robbie Lis M, PA-C Taking Active   Multiple Vitamin (MULTIVITAMIN WITH MINERALS) TABS tablet [id number] No Take 1 tablet by mouth daily. [provider] Taking Active Self, Pharmacy  Records  nystatin (MYCOSTATIN) 100000 UNIT/ML suspension [id number] No Take 5 mLs by mouth 4 (four) times daily. [provider] Taking Active   Ondansetron HCl (ZOFRAN PO) [id number] No 4 mg. Take 1 tablet on the tongue and allow to dissolve [provider] Taking Active            Med Note Curley Spice, [facility] K   Mon Jan 04, 2023 12:24 PM) Taking PRN   phenol (CHLORASEPTIC) 1.4 % LIQD [id number] No Use as directed 1 spray in the mouth or throat as needed for throat irritation / pain. [provider] Taking Active   potassium chloride SA (KLOR-CON M) 20 MEQ tablet [id number] No Take 3 tablets (60 mEq total) by mouth 2 (two) times daily. Dorthula Nettles, DO Taking Active   risperiDONE (RISPERDAL) 0.5 MG tablet [id number] No Take 1 tablet (0.5 mg total) by mouth 2 (two) times daily at 8 am and 4 pm. Cleotis Nipper, MD Taking Active   simvastatin (ZOCOR) 10 MG tablet [id number] No TAKE 1 TABLET EVERY EVENING Bensimhon, Bevelyn Buckles, MD Taking Active   spironolactone (ALDACTONE) 25 MG tablet [id number] No TAKE 1 TABLET (25 MG TOTAL) BY MOUTH EVERY EVENING. Bensimhon, Bevelyn Buckles, MD Taking Active   tirzepatide [facility]) 7.5 MG/0.5ML Pen [id number] No Inject 7.5 mg into the skin once a week. Bensimhon, Bevelyn Buckles, MD Taking Active   torsemide (DEMADEX) 100 MG tablet [id number] No Take 1 tablet (100 mg total) by mouth 2 (two) times daily.  [place], Anderson Malta, FNP Taking Active             Recommendation:   PCP Follow-up Outreach to MD office or RN CCM for worsening symptoms Seek emergency services as needed Encouraged spouse to get family assist to initiate medicaid application online  Follow Up Plan:   Telephone follow up appointment date/time:  09/17/23   Cala Bradford L. Noelle Penner, RN, BSN, CCM [place]  [facility], [facility] RN Care Manager Direct Dial: [phone]  Fax: [phone]

## 2023-09-03 NOTE — Patient Instructions (Addendum)
 Visit Information  Thank you for taking time to visit with me today. Please don't hesitate to contact me if I can be of assistance to you before our next scheduled appointment.  Your next care management appointment is by telephone on 09/17/23 at 2:30 pm  Please attempt to initiate the medicaid application online at [email or web address]   Please call the care guide team at [phone] if you need to cancel, schedule, or reschedule an appointment.   Please call the Suicide and Crisis Lifeline: 988 call the [place] National Suicide Prevention Lifeline: (606) 749-4210 or TTY: 862-562-6679 TTY 310-681-7298) to talk to a trained counselor call 1-800-273-TALK (toll free, 24 hour hotline) go to [facility] [address] [place] [phone]) call 911 if you are experiencing a Mental Health or Behavioral Health Crisis or need someone to talk to.   Pegeen Stiger L. Noelle Penner, RN, BSN, CCM [place]  [facility], [facility] RN Care Manager Direct Dial: [phone]  Fax: [phone]

## 2023-09-03 NOTE — Patient Outreach (Addendum)
 Second encounter opened in error  Preston L. Noelle Penner, RN, BSN, CCM [place]  [facility], [facility] RN Care Manager Direct Dial: [phone]  Fax: [phone]

## 2023-09-07 ENCOUNTER — Other Ambulatory Visit (HOSPITAL_COMMUNITY): Payer: Self-pay

## 2023-09-07 NOTE — Progress Notes (Signed)
 Paramedicine Encounter    Patient ID: Rhonda [name], female    DOB: [birth year], 73 y.o.   MRN: [id number]   Complaints-general fatigue, short term memory loss (this has been ongoing)   Assessment- CAO4, warm and dry, ambulatory in her home with no shortness of breath. Lungs clear, no lower leg edema, weight down 2 lbs.   Compliance with meds- missed three noon doses   Pill box filled- for one week   Refills needed-  -clonazepam -citalopram  -risperidone  -namenda   Meds changes since last visit- none     Social changes- none   VISIT SUMMARY- Arrived for home visit for Rhonda [name] who reports to be feeling well with no complaints today other than general fatigue and ongoing short term memory loss. She has been mostly laying around, watching tv- minimal exercise or getting outdoors. Her home aide who comes once a week says she is going to get her moving more. Narda Amber agreed with this plan. I obtained vitals and assessment as noted Lungs clear, minimal lower leg edema, med reviewed and confirmed. Pill box filled for one week. Refills as noted will be called into [facility]. I reviewed upcoming appointments and plan to see Heidi in one week on Monday. She agreed. Home visit complete.   BP 110/70   Pulse 88   Resp 16   Wt 210 lb (95.3 kg)   SpO2 98%   BMI 41.01 kg/m  Weight yesterday-- didn't weigh  Last visit weight-212lbs     ACTION: Home visit completed     Patient Care Team: Shon Hale, MD as PCP - General (Family Medicine) Burna Sis, LCSW as Social Worker (Licensed Clinical Social Worker) [name], Noberto Retort, DO as Consulting Physician (Neurology) [name], Paramedic as Paramedic Chanetta Marshall, Meridee Score, MD as Attending Physician (Family Medicine) Clinton Gallant, RN as [facility] Care Management Bensimhon, Bevelyn Buckles, MD as Consulting Physician (Cardiology) Lolly Mustache, Phillips Grout, MD as Consulting Physician (Psychiatry) Candelaria Stagers, DPM as  Consulting Physician (Podiatry) Bridgett Larsson, LCSW as [facility] Care Management (Licensed Clinical Social Worker)  Patient Active Problem List   Diagnosis Date Noted   Mild dementia 04/20/2023   Diabetic neuropathy    Elevated troponin 12/02/2021   Acute left ankle pain 10/11/2021   Hyponatremia 10/08/2021   Decreased estrogen level 08/01/2021   Hyperparathyroidism 08/01/2021   Spondylolisthesis 08/01/2021   Varicose veins of bilateral lower extremities with other complications 08/01/2021   Lumbago without sciatica 04/30/2021   Abnormal gait 06/11/2020   Allergic rhinitis 06/11/2020   Asthma 06/11/2020   Benign intracranial hypertension 06/11/2020   Body mass index (BMI) 45.0-49.9, adult 06/11/2020   Bowel incontinence 06/11/2020   Cholelithiasis 06/11/2020   Chronic sinusitis 06/11/2020   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   Daytime somnolence 06/11/2020   Edema 06/11/2020   Family history of malignant neoplasm of gastrointestinal tract 06/11/2020   Gout 06/11/2020   Insomnia 06/11/2020   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   Lumbar spondylosis with myelopathy 06/11/2020   Macrocytosis 06/11/2020   History of colonic polyps 06/11/2020   Repeated falls 06/11/2020   Irritable bowel syndrome 01/04/2020   Spinal stenosis of lumbar region 01/03/2020   Bilateral leg weakness 08/01/2019   Type II diabetes mellitus 06/15/2019   Hypokalemia 11/08/2018   CKD (chronic kidney disease) stage 3, GFR 30-59 ml/min 11/08/2018   Orthostatic hypotension 07/28/2017   On home oxygen therapy    Migraines    Left bundle branch block  Hypothyroidism    Hypertension    Hyperlipidemia    Heart murmur    GERD (gastroesophageal reflux disease)    Exertional shortness of breath    Major depressive disorder    Back pain    Arthritis    Generalized anxiety disorder    Anemia    Chronic respiratory failure 09/14/2013   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/04/2013   Chronic  systolic heart failure 10/27/2012   Endometrial polyp 01/20/2012   Malignant tumor of breast 03/26/2011   Vitamin D deficiency 03/26/2011    Current Outpatient Medications:    acetaminophen (TYLENOL) 500 MG tablet, Take 500 mg by mouth every 6 (six) hours as needed., Disp: , Rfl:    acetaZOLAMIDE (DIAMOX) 125 MG tablet, Take 1 tablet (125 mg total) by mouth daily., Disp: 90 tablet, Rfl: 3   allopurinol (ZYLOPRIM) 100 MG tablet, Take 0.5 tablets (50 mg total) by mouth daily., Disp: 45 tablet, Rfl: 1   azelastine (ASTELIN) 0.1 % nasal spray, Place 2 sprays into both nostrils 2 (two) times daily. Use in each nostril as directed, Disp: 30 mL, Rfl: 12   buPROPion (WELLBUTRIN XL) 150 MG 24 hr tablet, Take 1 tablet (150 mg total) by mouth daily., Disp: 30 tablet, Rfl: 2   carvedilol (COREG) 3.125 MG tablet, TAKE 1 TABLET (3.125 MG) BY MOUTH TWICE DAILY WITH MEALS, Disp: 180 tablet, Rfl: 3   citalopram (CELEXA) 20 MG tablet, Take 1 tablet (20 mg total) by mouth daily., Disp: 30 tablet, Rfl: 2   clonazePAM (KLONOPIN) 0.5 MG tablet, Take 1 tablet (0.5 mg total) by mouth 2 (two) times daily., Disp: 60 tablet, Rfl: 2   cyanocobalamin (VITAMIN B12) 1000 MCG tablet, Take 1,000 mcg by mouth daily., Disp: , Rfl:    desloratadine (CLARINEX) 5 MG tablet, Take 1 tablet (5 mg total) by mouth daily., Disp: 90 tablet, Rfl: 3   dicyclomine (BENTYL) 20 MG tablet, Take 20 mg by mouth 3 (three) times daily before meals., Disp: , Rfl:    famotidine (PEPCID) 20 MG tablet, Take 20 mg by mouth 2 (two) times daily., Disp: , Rfl:    fluticasone (FLONASE) 50 MCG/ACT nasal spray, Place 1 spray into both nostrils daily., Disp: , Rfl:    gabapentin (NEURONTIN) 100 MG capsule, TAKE 1 CAPSULE (100 MG TOTAL) BY MOUTH 2 (TWO) TIMES DAILY., Disp: 180 capsule, Rfl: 0   levothyroxine (SYNTHROID) 88 MCG tablet, TAKE 1 TABLET(88 MCG) BY MOUTH DAILY BEFORE BREAKFAST, Disp: 90 tablet, Rfl: 3   memantine (NAMENDA) 10 MG tablet, Take half  tablet twice daily x 1 week, then increase to half tablet in the morning and 1 tablet at bedtime x 1 week, then 1 tablet BID, Disp: 60 tablet, Rfl: 11   metolazone (ZAROXOLYN) 2.5 MG tablet, TAKE 1 TABLET BY MOUTH ONCE A WEEK, Disp: 4 tablet, Rfl: 1   midodrine (PROAMATINE) 5 MG tablet, Take 1 tablet (5 mg total) by mouth 3 (three) times daily. (Patient taking differently: Take 10 mg by mouth 3 (three) times daily.), Disp: 200 tablet, Rfl: 3   Multiple Vitamin (MULTIVITAMIN WITH MINERALS) TABS tablet, Take 1 tablet by mouth daily., Disp: , Rfl:    nystatin (MYCOSTATIN) 100000 UNIT/ML suspension, Take 5 mLs by mouth 4 (four) times daily., Disp: , Rfl:    Ondansetron HCl (ZOFRAN PO), 4 mg. Take 1 tablet on the tongue and allow to dissolve, Disp: , Rfl:    phenol (CHLORASEPTIC) 1.4 % LIQD, Use as directed  1 spray in the mouth or throat as needed for throat irritation / pain., Disp: , Rfl:    potassium chloride SA (KLOR-CON M) 20 MEQ tablet, Take 3 tablets (60 mEq total) by mouth 2 (two) times daily., Disp: 572 tablet, Rfl: 3   risperiDONE (RISPERDAL) 0.5 MG tablet, Take 1 tablet (0.5 mg total) by mouth 2 (two) times daily at 8 am and 4 pm., Disp: 60 tablet, Rfl: 2   simvastatin (ZOCOR) 10 MG tablet, TAKE 1 TABLET EVERY EVENING, Disp: 90 tablet, Rfl: 3   spironolactone (ALDACTONE) 25 MG tablet, TAKE 1 TABLET (25 MG TOTAL) BY MOUTH EVERY EVENING., Disp: 90 tablet, Rfl: 3   tirzepatide (MOUNJARO) 7.5 MG/0.5ML Pen, Inject 7.5 mg into the skin once a week., Disp: 2 mL, Rfl: 0   torsemide (DEMADEX) 100 MG tablet, Take 1 tablet (100 mg total) by mouth 2 (two) times daily., Disp: 180 tablet, Rfl: 3 Allergies  Allergen Reactions   Ceftin Anaphylaxis    Face and throat swell    Cefuroxime Axetil Anaphylaxis    Face and throat swell   Geodon [Ziprasidone Hcl] Hives   Lisinopril Other (See Comments)    angioedema   Cefuroxime     Other reaction(s): anaphylaxis   Sulfacetamide Sodium-Sulfur     Other  reaction(s): itch   Allopurinol Nausea Only and Other (See Comments)    weakness   Ativan [Lorazepam] Itching   Sulfa Antibiotics Itching   Valium [Diazepam] Other (See Comments)    Patient states that diazepam doesn't relax, it has the opposite effect.     Social History   Socioeconomic History   Marital status: Married    Spouse name: Not on file   Number of children: 2   Years of education: 2   Highest education level: Master's degree (e.g., MA, MS, MEng, MEd, MSW, MBA)  Occupational History   Occupation: retired  Tobacco Use   Smoking status: Some Days    Current packs/day: 0.00    Average packs/day: 0.1 packs/day for 26.0 years (2.6 ttl pk-yrs)    Types: Cigarettes    Start date: 03/24/1995    Last attempt to quit: 03/23/2021    Years since quitting: 2.4   Smokeless tobacco: Never  Vaping Use   Vaping status: Never Used  Substance and Sexual Activity   Alcohol use: No   Drug use: No   Sexual activity: Yes  Other Topics Concern   Not on file  Social History Narrative   Tobacco Use Cigarettes: Former Smoker, Quit in 2008   No Alcohol   No recreational drug use   Diet: Regular/Low Carb   Exercise: None   Occupation: disabled   Education: Company secretary, masters   Children: 2   Firearms: No   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      Social Drivers of Health   Financial Resource Strain: Not on file  Food Insecurity: No Food Insecurity (09/02/2023)   Hunger Vital Sign    Worried About Running Out of Food in the Last Year: Never true    Ran Out of Food in the Last Year: Never true  Transportation Needs: No Transportation Needs (09/02/2023)   PRAPARE - Administrator, Civil Service (Medical): No    Lack of Transportation (Non-Medical): No  Physical Activity: Not on file  Stress: No Stress Concern Present (08/11/2022)   [name] of Occupational Health -  Occupational Stress Questionnaire    Feeling  of Stress : Only a little  Social Connections: Not on file  Intimate Partner Violence: Not At Risk (09/02/2023)   Humiliation, Afraid, Rape, and Kick questionnaire    Fear of Current or Ex-Partner: No    Emotionally Abused: No    Physically Abused: No    Sexually Abused: No    Physical Exam      Future Appointments  Date Time Provider Department Center  09/09/2023  3:20 PM Arfeen, Bronson Canny, MD [facility] None  09/13/2023  7:10 AM [facility] DEVICE REMOTES [facility]  09/17/2023  2:30 PM Arlyce Berger, RN [facility] None  09/20/2023  7:30 AM [facility] DEVICE REMOTES [facility]  09/22/2023  1:45 PM Luella Sager, DPM [facility]  09/23/2023  1:00 PM Alease Hunter D, LCSW [facility] None  09/23/2023  2:45 PM Thomasena Fleming, NP [facility]  10/13/2023  1:30 PM [facility] PA/NP [facility] None  12/13/2023  7:20 AM [facility] DEVICE REMOTES [facility]  01/11/2024  2:30 PM Patel, Donika K, DO [facility] None  03/13/2024  7:10 AM [facility] DEVICE REMOTES [facility]  06/12/2024  7:10 AM [facility] DEVICE REMOTES [facility]  09/11/2024  7:10 AM [facility] DEVICE REMOTES [facility]  12/11/2024  7:10 AM [facility] DEVICE REMOTES [facility]  03/12/2025  7:10 AM [facility] DEVICE REMOTES [facility]

## 2023-09-09 ENCOUNTER — Encounter (HOSPITAL_COMMUNITY): Payer: Self-pay | Admitting: Psychiatry

## 2023-09-09 ENCOUNTER — Ambulatory Visit (HOSPITAL_BASED_OUTPATIENT_CLINIC_OR_DEPARTMENT_OTHER): Payer: Medicare HMO | Admitting: Psychiatry

## 2023-09-09 VITALS — BP 150/86 | HR 85 | Resp 20 | Ht 61.0 in | Wt 212.8 lb

## 2023-09-09 DIAGNOSIS — F330 Major depressive disorder, recurrent, mild: Secondary | ICD-10-CM | POA: Diagnosis not present

## 2023-09-09 DIAGNOSIS — R4189 Other symptoms and signs involving cognitive functions and awareness: Secondary | ICD-10-CM | POA: Diagnosis not present

## 2023-09-09 DIAGNOSIS — F411 Generalized anxiety disorder: Secondary | ICD-10-CM | POA: Diagnosis not present

## 2023-09-09 MED ORDER — BUPROPION HCL ER (XL) 150 MG PO TB24: 150.0000 mg | ORAL_TABLET | Freq: Every day | ORAL | 5 refills | Status: DC

## 2023-09-09 MED ORDER — CITALOPRAM HYDROBROMIDE 20 MG PO TABS: 20.0000 mg | ORAL_TABLET | Freq: Every day | ORAL | 5 refills | Status: DC

## 2023-09-09 MED ORDER — CLONAZEPAM 0.5 MG PO TABS: 0.5000 mg | ORAL_TABLET | Freq: Every day | ORAL | 5 refills | Status: DC

## 2023-09-09 MED ORDER — RISPERIDONE 0.5 MG PO TABS: 0.5000 mg | ORAL_TABLET | ORAL | 5 refills | Status: DC

## 2023-09-09 NOTE — Progress Notes (Signed)
 [facility] MD/PA/NP OP Progress Note  Patient location; office Provider location; office  09/09/2023 4:43 PM Rhonda [name]  MRN:  [id number]  Chief Complaint:  Chief Complaint  Patient presents with   Follow-up   Medication Refill   HPI: Patient came with her husband for the follow appointment.  Patient is a poor historian and most of the information was obtained from her husband.  Husband reported that she is sleeping better but sometimes she also sleep too much during the day.  However no agitation, aggression, violence.  Patient has 4 hours a week home health aide and also she get staff from her primary care office who organizes the medication.  Husband tried to enroll her in pace program but due to conflict of timing could not do it.  Husband noticed patient memory remain a challenge and sometimes difficult to do ADLs.  Patient denies any suicidal thoughts or homicidal thoughts.  She admitted sleep too much but overall medicine helping her mood, anxiety, irritability.  She denies any hallucination, paranoia or any suicidal thoughts.  Her appetite is okay.  Her weight stable.  Husband works full-time job and usually patient is by herself during the day but husband calls frequently to check on her.  Visit Diagnosis:    ICD-10-CM   1. Major depressive disorder, recurrent episode, mild (HCC)  F33.0 buPROPion (WELLBUTRIN XL) 150 MG 24 hr tablet    citalopram (CELEXA) 20 MG tablet    risperiDONE (RISPERDAL) 0.5 MG tablet    clonazePAM (KLONOPIN) 0.5 MG tablet    2. Cognitive decline  R41.89 buPROPion (WELLBUTRIN XL) 150 MG 24 hr tablet    3. Generalized anxiety disorder  F41.1 citalopram (CELEXA) 20 MG tablet    clonazePAM (KLONOPIN) 0.5 MG tablet        Past Psychiatric History: Reviewed. History of depression, anxiety and seen by Dr. Germain Kohler for a few years but do not remember the details. History of inpatient in March 2024 due to severe psychotic and aggressive behavior. Patient was  discharged on trazodone, Seroquel, risperidone, Celexa, Wellbutrin, Klonopin and Wellbutrin. She was also given olanzapine, Haldol for agitation. No history of suicidal attempt.   Past Medical History:  Past Medical History:  Diagnosis Date   Abnormal gait 06/11/2020   Acute left ankle pain 10/11/2021   AKI (acute kidney injury) 10/08/2021   Allergic rhinitis 06/11/2020   Anemia    Arthritis    Right knee   Asthma 06/11/2020   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   Back pain    Disk problem   Benign intracranial hypertension 06/11/2020   Bilateral leg weakness 08/01/2019   Biventricular ICD (implantable cardioverter-defibrillator) in place 08/04/2013   Body mass index (BMI) 45.0-49.9, adult 06/11/2020   Bowel incontinence 06/11/2020   Cardiomyopathy    Cholelithiasis 06/11/2020   Chronic respiratory failure 09/14/2013   Chronic sinusitis 06/11/2020   Chronic systolic heart failure 10/27/2012   a) NICM b) ECHO (03/2013) EF 20-25% c) ECHO (09/2013) EF 45-50%, grade I DD   CKD (chronic kidney disease) stage 3, GFR 30-59 ml/min 11/08/2018   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   Complication of anesthesia    History of low blood pressure after surgery; attributed to lying flat   Daytime somnolence 06/11/2020   Decreased estrogen level 08/01/2021   Diabetic neuropathy    Diarrhea of presumed infectious origin 06/11/2020   Edema 06/11/2020   Elevated troponin 12/02/2021   Endometrial polyp 01/20/2012   Exertional shortness of breath    Generalized anxiety  disorder    GERD (gastroesophageal reflux disease)    Gout 06/11/2020   Heart murmur    History of colonic polyps 06/11/2020   History of fall 06/11/2020   Hyperlipidemia    Hyperparathyroidism 08/01/2021   Hypertension    Hypokalemia 11/08/2018   Hyponatremia 10/08/2021   Hypothyroidism    Insomnia 06/11/2020   Intractable nausea and vomiting 10/08/2021   Irritable bowel syndrome 01/04/2020   Left bundle branch block    s/p CRT-D  (04/2013)   Lumbago without sciatica 04/30/2021   Lumbar spondylosis with myelopathy 06/11/2020   Macrocytosis 06/11/2020   Major depressive disorder    Malignant tumor of breast 03/26/2011   Left; completed chemotherapy and radiation treatments   Migraines    Mild dementia 04/20/2023   On home oxygen therapy    "2L suppose to be q night" (05/03/2013)   Orthostatic hypotension 07/28/2017   Spinal stenosis of lumbar region 01/03/2020   Spondylolisthesis 08/01/2021   SVD (spontaneous vaginal delivery)    x 2   Type II diabetes mellitus 06/15/2019   Varicose veins of bilateral lower extremities with other complications 08/01/2021   Vitamin D deficiency 03/26/2011    Past Surgical History:  Procedure Laterality Date   BI-VENTRICULAR IMPLANTABLE CARDIOVERTER DEFIBRILLATOR N/A 05/03/2013   Procedure: BI-VENTRICULAR IMPLANTABLE CARDIOVERTER DEFIBRILLATOR  (CRT-D);  Surgeon: Tammie Fall, MD;  Location: [facility] CATH LAB;  Service: Cardiovascular;  Laterality: N/A;   BI-VENTRICULAR IMPLANTABLE CARDIOVERTER DEFIBRILLATOR  (CRT-D)  05/03/2013   MDT CRTD implanted by Dr Carolynne Citron for non ischemic cardiomyopathy   BIOPSY  05/31/2018   Procedure: BIOPSY;  Surgeon: Genell Ken, MD;  Location: [facility];  Service: Gastroenterology;;   BIV ICD GENERATOR CHANGEOUT N/A 04/11/2020   Procedure: BIV ICD GENERATOR CHANGEOUT;  Surgeon: Tammie Fall, MD;  Location: [facility] INVASIVE CV LAB;  Service: Cardiovascular;  Laterality: N/A;   BREAST LUMPECTOMY Left 07/28/2010   CARDIAC CATHETERIZATION  2010   NORMAL CORONARY ARTERIES   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--11 SURGERIES     PT HAS REMOVABLE SPEECH BULB-TAKES IT OUT BEFORE HER SURGERY   COLONOSCOPY     COLONOSCOPY N/A 05/31/2018   Procedure: COLONOSCOPY;  Surgeon: Genell Ken, MD;  Location: [facility] ENDOSCOPY;  Service: Gastroenterology;  Laterality: N/A;   HYSTEROSCOPY WITH D & C  01/20/2012   Procedure: DILATATION AND CURETTAGE /HYSTEROSCOPY;  Surgeon: Piedad Brewer, MD;  Location: [facility] ORS;  Service: Gynecology;  Laterality: N/A;  with trueclear   PORT-A-CATH REMOVAL  09/23/2011   Procedure: REMOVAL PORT-A-CATH;  Surgeon: Enid Harry, MD;  Location: [facility] ORS;  Service: General;  Laterality: N/A;  Port Removal   PORTACATH PLACEMENT  2012   TONSILLECTOMY  1960's   TOTAL KNEE ARTHROPLASTY  05/10/2012   Procedure: TOTAL KNEE ARTHROPLASTY;  Surgeon: Bevin Bucks, MD;  Location: [facility] ORS;  Service: Orthopedics;  Laterality: Right;   WISDOM TOOTH EXTRACTION      Family Psychiatric History: Reviewed.  Family History:  Family History  Problem Relation Age of Onset   Alzheimer's disease Mother    CVA Mother    Hypertension Mother    Cancer - Colon Father        late 62   Birth defects Paternal Uncle     Social History:  Social History   Socioeconomic History   Marital status: Married    Spouse name: Not on file   Number of children: 2   Years of education: 18   Highest education  level: Master's degree (e.g., MA, MS, MEng, MEd, MSW, MBA)  Occupational History   Occupation: retired  Tobacco Use   Smoking status: Some Days    Current packs/day: 0.00    Average packs/day: 0.1 packs/day for 26.0 years (2.6 ttl pk-yrs)    Types: Cigarettes    Start date: 03/24/1995    Last attempt to quit: 03/23/2021    Years since quitting: 2.4   Smokeless tobacco: Never  Vaping Use   Vaping status: Never Used  Substance and Sexual Activity   Alcohol use: No   Drug use: No   Sexual activity: Yes  Other Topics Concern   Not on file  Social History Narrative   Tobacco Use Cigarettes: Former Smoker, Quit in 2008   No Alcohol   No recreational drug use   Diet: Regular/Low Carb   Exercise: None   Occupation: disabled   Education: Company secretary, masters   Children: 2   Firearms: No   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      Social Drivers of Health   Financial Resource Strain: Not on file  Food  Insecurity: No Food Insecurity (09/02/2023)   Hunger Vital Sign    Worried About Running Out of Food in the Last Year: Never true    Ran Out of Food in the Last Year: Never true  Transportation Needs: No Transportation Needs (09/02/2023)   PRAPARE - Administrator, Civil Service (Medical): No    Lack of Transportation (Non-Medical): No  Physical Activity: Not on file  Stress: No Stress Concern Present (08/11/2022)   [name] of Occupational Health - Occupational Stress Questionnaire    Feeling of Stress : Only a little  Social Connections: Not on file    Allergies:  Allergies  Allergen Reactions   Ceftin Anaphylaxis    Face and throat swell    Cefuroxime Axetil Anaphylaxis    Face and throat swell   Geodon [Ziprasidone Hcl] Hives   Lisinopril Other (See Comments)    angioedema   Cefuroxime     Other reaction(s): anaphylaxis   Sulfacetamide Sodium-Sulfur     Other reaction(s): itch   Allopurinol Nausea Only and Other (See Comments)    weakness   Ativan [Lorazepam] Itching   Sulfa Antibiotics Itching   Valium [Diazepam] Other (See Comments)    Patient states that diazepam doesn't relax, it has the opposite effect.    Metabolic Disorder Labs: Lab Results  Component Value Date   HGBA1C 7.0 (H) 10/09/2021   MPG 154.2 10/09/2021   MPG 151.33 10/08/2021   No results found for: "PROLACTIN" Lab Results  Component Value Date   CHOL 133 07/31/2022   TRIG 67 07/31/2022   HDL 51 07/31/2022   CHOLHDL 2.6 07/31/2022   VLDL 13 07/31/2022   LDLCALC 69 07/31/2022   LDLCALC  08/05/2009    92        Total Cholesterol/HDL:CHD Risk Coronary Heart Disease Risk Table                     Men   Women  1/2 Average Risk   3.4   3.3  Average Risk       5.0   4.4  2 X Average Risk   9.6   7.1  3 X Average Risk  23.4   11.0        Use the calculated Patient  Ratio above and the CHD Risk Table to determine the patient's CHD Risk.        ATP III CLASSIFICATION  (LDL):  <100     mg/dL   Optimal  161-096  mg/dL   Near or Above                    Optimal  130-159  mg/dL   Borderline  045-409  mg/dL   High  >811     mg/dL   Very High   Lab Results  Component Value Date   TSH 2.398 07/26/2022   TSH 1.127 10/08/2021    Therapeutic Level Labs: No results found for: "LITHIUM" No results found for: "VALPROATE" No results found for: "CBMZ"  Current Medications: Current Outpatient Medications  Medication Sig Dispense Refill   acetaminophen (TYLENOL) 500 MG tablet Take 500 mg by mouth every 6 (six) hours as needed.     acetaZOLAMIDE (DIAMOX) 125 MG tablet Take 1 tablet (125 mg total) by mouth daily. 90 tablet 3   carvedilol (COREG) 3.125 MG tablet TAKE 1 TABLET (3.125 MG) BY MOUTH TWICE DAILY WITH MEALS 180 tablet 3   cyanocobalamin (VITAMIN B12) 1000 MCG tablet Take 1,000 mcg by mouth daily.     desloratadine (CLARINEX) 5 MG tablet Take 1 tablet (5 mg total) by mouth daily. 90 tablet 3   famotidine (PEPCID) 20 MG tablet Take 20 mg by mouth 2 (two) times daily.     fluticasone (FLONASE) 50 MCG/ACT nasal spray Place 1 spray into both nostrils daily.     gabapentin (NEURONTIN) 100 MG capsule TAKE 1 CAPSULE (100 MG TOTAL) BY MOUTH 2 (TWO) TIMES DAILY. 180 capsule 0   levothyroxine (SYNTHROID) 88 MCG tablet TAKE 1 TABLET(88 MCG) BY MOUTH DAILY BEFORE BREAKFAST 90 tablet 3   memantine (NAMENDA) 10 MG tablet Take half tablet twice daily x 1 week, then increase to half tablet in the morning and 1 tablet at bedtime x 1 week, then 1 tablet BID 60 tablet 11   midodrine (PROAMATINE) 5 MG tablet Take 1 tablet (5 mg total) by mouth 3 (three) times daily. (Patient taking differently: Take 10 mg by mouth 3 (three) times daily.) 200 tablet 3   Multiple Vitamin (MULTIVITAMIN WITH MINERALS) TABS tablet Take 1 tablet by mouth daily.     nystatin (MYCOSTATIN) 100000 UNIT/ML suspension Take 5 mLs by mouth 4 (four) times daily.     Ondansetron HCl (ZOFRAN PO) 4 mg. Take 1  tablet on the tongue and allow to dissolve     phenol (CHLORASEPTIC) 1.4 % LIQD Use as directed 1 spray in the mouth or throat as needed for throat irritation / pain.     potassium chloride SA (KLOR-CON M) 20 MEQ tablet Take 3 tablets (60 mEq total) by mouth 2 (two) times daily. 572 tablet 3   simvastatin (ZOCOR) 10 MG tablet TAKE 1 TABLET EVERY EVENING 90 tablet 3   spironolactone (ALDACTONE) 25 MG tablet TAKE 1 TABLET (25 MG TOTAL) BY MOUTH EVERY EVENING. 90 tablet 3   tirzepatide (MOUNJARO) 7.5 MG/0.5ML Pen Inject 7.5 mg into the skin once a week. 2 mL 0   torsemide (DEMADEX) 100 MG tablet Take 1 tablet (100 mg total) by mouth 2 (two) times daily. 180 tablet 3   allopurinol (ZYLOPRIM) 100 MG tablet Take 0.5 tablets (50 mg total) by mouth daily. (Patient not taking: Reported on 09/09/2023) 45 tablet 1   azelastine (ASTELIN) 0.1 % nasal spray Place 2  sprays into both nostrils 2 (two) times daily. Use in each nostril as directed (Patient not taking: Reported on 09/09/2023) 30 mL 12   buPROPion (WELLBUTRIN XL) 150 MG 24 hr tablet Take 1 tablet (150 mg total) by mouth daily. 30 tablet 5   citalopram (CELEXA) 20 MG tablet Take 1 tablet (20 mg total) by mouth daily. 30 tablet 5   clonazePAM (KLONOPIN) 0.5 MG tablet Take 1 tablet (0.5 mg total) by mouth at bedtime. 30 tablet 5   dicyclomine (BENTYL) 20 MG tablet Take 20 mg by mouth 3 (three) times daily before meals. (Patient not taking: Reported on 09/09/2023)     metolazone (ZAROXOLYN) 2.5 MG tablet TAKE 1 TABLET BY MOUTH ONCE A WEEK 4 tablet 1   risperiDONE (RISPERDAL) 0.5 MG tablet Take 1 tablet (0.5 mg total) by mouth 2 (two) times daily at 8 am and 4 pm. 60 tablet 5   No current facility-administered medications for this visit.   Psychiatric Specialty Exam: Physical Exam  Review of Systems  Neurological:        Memory issues  Psychiatric/Behavioral:  Positive for decreased concentration.     Blood pressure (!) 150/86, pulse 85, resp. rate 20,  height 5\' 1"  (1.549 m), weight 212 lb 12.8 oz (96.5 kg).Body mass index is 40.21 kg/m.  General Appearance: Casual  Eye Contact:  Fair  Speech:  Slow  Volume:  Decreased  Mood:  Euthymic  Affect:  Restricted  Thought Process:  Descriptions of Associations: Intact  Orientation:  Full (Time, Place, and Person)  Thought Content:  Abstract Reasoning  Suicidal Thoughts:  No  Homicidal Thoughts:  No  Memory:  Immediate;   Fair Recent;   Fair Remote;   Fair  Judgement:  Fair  Insight:  Shallow  Psychomotor Activity:  Decreased  Concentration:  Concentration: Fair and Attention Span: Fair  Recall:  [employer] of Knowledge:  Fair  Language:  Fair  Akathisia:  No  Handed:  Right  AIMS (if indicated):     Assets:  Communication Skills Desire for Improvement Housing Social Support  ADL's:  Impaired  Cognition:  Impaired,  Mild  Sleep:   fair     Screenings: AUDIT    Flowsheet Row Admission (Discharged) from [facility]/20[facility] BEHAVIORAL MEDICINE  Alcohol Use Disorder Identification Test Final Score (AUDIT) 0      PHQ2-9    Flowsheet Row Patient Outreach from 09/03/2023 in [place] [facility]ULATION HEALTH DEPARTMENT Care Coordination from 08/11/2022 in [facility] Community Care Coordination  PHQ-2 Total Score 1 1      Flowsheet Row ED from 09/03/2022 in [facility] at [facility]) Admission (Discharged) from 07/27/2022 in [facility] BEHAVIORAL MEDICINE ED from 07/26/2022 in [facility] Emergency Department at [facility]  C-SSRS RISK CATEGORY No Risk No Risk No Risk        Assessment and Plan: Review current medication.  Patient's condition is stable and has not much changed from the past.  I recommend to stop taking the Klonopin in the morning and keep only in the evening.  Since patient has been sleeping too much in the morning.  No major concern for the medication.  Continue risperidone 0.5 mg twice a day, Lexapro 20  mg daily, Wellbutrin XL 150 mg in the morning and she will take Klonopin 0.5 mg only once a day.  I encouraged to call us back if she has any question or any concern.  Follow-up in 6 months.  Treatment plan discussed with the patient and her husband.  Collaboration of Care: Collaboration of Care: Other provider involved in patient's care AEB notes are available in epic to review.  Patient/Guardian was advised Release of Information must be obtained prior to any record release in order to collaborate their care with an outside provider. Patient/Guardian was advised if they have not already done so to contact the registration department to sign all necessary forms in order for us  to release information regarding their care.   Consent: Patient/Guardian gives verbal consent for treatment and assignment of benefits for services provided during this visit. Patient/Guardian expressed understanding and agreed to proceed.   I provided 21 minutes of face-to-face time to the patient during this encounter  Arturo Late, MD 09/09/2023, 4:43 PM Patient ID: Rhonda [name], female   DOB: 07/20/1950, 73 y.o.   MRN: [id number]

## 2023-09-13 ENCOUNTER — Ambulatory Visit (INDEPENDENT_AMBULATORY_CARE_PROVIDER_SITE_OTHER): Payer: Medicare HMO

## 2023-09-13 ENCOUNTER — Other Ambulatory Visit (HOSPITAL_COMMUNITY): Payer: Self-pay

## 2023-09-13 DIAGNOSIS — I428 Other cardiomyopathies: Secondary | ICD-10-CM

## 2023-09-13 DIAGNOSIS — I5022 Chronic systolic (congestive) heart failure: Secondary | ICD-10-CM

## 2023-09-13 NOTE — Progress Notes (Signed)
 Paramedicine Encounter    Patient ID: Rhonda [name], female    DOB: 06-18-1950, 73 y.o.   MRN: [id number]   Complaints- general fatigue   Assessment- CAOx4, warm and dry reporting to be feeling well, no lower leg edema. Lungs clear, vitals as noted. Denied chest pain, shortness of breath, dizziness.   Compliance with meds- missed three evening doses, two noon doses, two morning doses   Pill box filled- for one week   Refills needed- risperidone    Meds changes since last visit- klonopin  to once at bedtime     Social changes- none    VISIT SUMMARY- Arrived for home visit for Rhonda [name] who reports to be feeling okay just having some general fatigue. She says she just has no motivation to do anything. I obtained vitals and assessment as noted. Lungs clear, no edema, weight down 1 lbs. Meds reviewed- she had several missed doses this week- she states she just forgot. Her husband agreed that he would give her her meds in a cup and she would forget and he'd place them back in the pill box. I discussed with them the importance of taking her meds correctly and ensuring that he confirms she took them. They both agreed. I reviewed HF education and upcoming appointments. Pill box filled for one week. Refills will be called in. Home visit complete.   BP 110/70   Pulse 88   Resp 16   Wt 211 lb (95.7 kg)   SpO2 99%   BMI 39.87 kg/m  Weight yesterday-- didn't weigh  Last visit weight-- 212lbs      ACTION: Home visit completed     Patient Care Team: Ransom Byers, MD as PCP - General (Family Medicine) Denton Flakes, LCSW as Social Worker (Licensed Clinical Social Worker) Lydia Sams, Michele Ahle, DO as Consulting Physician (Neurology) Roberts Ching, Paramedic as Paramedic Donia Furlough, Ancel Kass, MD as Attending Physician (Family Medicine) Arlyce Berger, RN as [facility] Care Management Bensimhon, Rheta Celestine, MD as Consulting Physician (Cardiology) Carlos Chesterfield, Bronson Canny, MD as  Consulting Physician (Psychiatry) Velma Ghazi, DPM as Consulting Physician (Podiatry) Adriana Albany, LCSW as [facility] Care Management (Licensed Clinical Social Worker)  Patient Active Problem List   Diagnosis Date Noted   Mild dementia 04/20/2023   Diabetic neuropathy    Elevated troponin 12/02/2021   Acute left ankle pain 10/11/2021   Hyponatremia 10/08/2021   Decreased estrogen level 08/01/2021   Hyperparathyroidism 08/01/2021   Spondylolisthesis 08/01/2021   Varicose veins of bilateral lower extremities with other complications 08/01/2021   Lumbago without sciatica 04/30/2021   Abnormal gait 06/11/2020   Allergic rhinitis 06/11/2020   Asthma 06/11/2020   Benign intracranial hypertension 06/11/2020   Body mass index (BMI) 45.0-49.9, adult 06/11/2020   Bowel incontinence 06/11/2020   Cholelithiasis 06/11/2020   Chronic sinusitis 06/11/2020   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   Daytime somnolence 06/11/2020   Edema 06/11/2020   Family history of malignant neoplasm of gastrointestinal tract 06/11/2020   Gout 06/11/2020   Insomnia 06/11/2020   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   Lumbar spondylosis with myelopathy 06/11/2020   Macrocytosis 06/11/2020   History of colonic polyps 06/11/2020   Repeated falls 06/11/2020   Irritable bowel syndrome 01/04/2020   Spinal stenosis of lumbar region 01/03/2020   Bilateral leg weakness 08/01/2019   Type II diabetes mellitus 06/15/2019   Hypokalemia 11/08/2018   CKD (chronic kidney disease) stage 3, GFR 30-59 ml/min 11/08/2018   Orthostatic hypotension 07/28/2017   On home  oxygen  therapy    Migraines    Left bundle branch block    Hypothyroidism    Hypertension    Hyperlipidemia    Heart murmur    GERD (gastroesophageal reflux disease)    Exertional shortness of breath    Major depressive disorder    Back pain    Arthritis    Generalized anxiety disorder    Anemia    Chronic respiratory failure 09/14/2013   Biventricular ICD (implantable  cardioverter-defibrillator) in place 08/04/2013   Chronic systolic heart failure 10/27/2012   Endometrial polyp 01/20/2012   Malignant tumor of breast 03/26/2011   Vitamin D  deficiency 03/26/2011    Current Outpatient Medications:    acetaminophen  (TYLENOL ) 500 MG tablet, Take 500 mg by mouth every 6 (six) hours as needed., Disp: , Rfl:    acetaZOLAMIDE  (DIAMOX ) 125 MG tablet, Take 1 tablet (125 mg total) by mouth daily., Disp: 90 tablet, Rfl: 3   allopurinol  (ZYLOPRIM ) 100 MG tablet, Take 0.5 tablets (50 mg total) by mouth daily., Disp: 45 tablet, Rfl: 1   azelastine  (ASTELIN ) 0.1 % nasal spray, Place 2 sprays into both nostrils 2 (two) times daily. Use in each nostril as directed, Disp: 30 mL, Rfl: 12   buPROPion  (WELLBUTRIN  XL) 150 MG 24 hr tablet, Take 1 tablet (150 mg total) by mouth daily., Disp: 30 tablet, Rfl: 5   carvedilol  (COREG ) 3.125 MG tablet, TAKE 1 TABLET (3.125 MG) BY MOUTH TWICE DAILY WITH MEALS, Disp: 180 tablet, Rfl: 3   citalopram  (CELEXA ) 20 MG tablet, Take 1 tablet (20 mg total) by mouth daily., Disp: 30 tablet, Rfl: 5   clonazePAM  (KLONOPIN ) 0.5 MG tablet, Take 1 tablet (0.5 mg total) by mouth at bedtime., Disp: 30 tablet, Rfl: 5   cyanocobalamin  (VITAMIN B12) 1000 MCG tablet, Take 1,000 mcg by mouth daily., Disp: , Rfl:    desloratadine  (CLARINEX ) 5 MG tablet, Take 1 tablet (5 mg total) by mouth daily., Disp: 90 tablet, Rfl: 3   dicyclomine  (BENTYL ) 20 MG tablet, Take 20 mg by mouth 3 (three) times daily before meals., Disp: , Rfl:    famotidine (PEPCID) 20 MG tablet, Take 20 mg by mouth 2 (two) times daily., Disp: , Rfl:    fluticasone  (FLONASE ) 50 MCG/ACT nasal spray, Place 1 spray into both nostrils daily., Disp: , Rfl:    gabapentin  (NEURONTIN ) 100 MG capsule, TAKE 1 CAPSULE (100 MG TOTAL) BY MOUTH 2 (TWO) TIMES DAILY., Disp: 180 capsule, Rfl: 0   levothyroxine  (SYNTHROID ) 88 MCG tablet, TAKE 1 TABLET(88 MCG) BY MOUTH DAILY BEFORE BREAKFAST, Disp: 90 tablet, Rfl:  3   memantine  (NAMENDA ) 10 MG tablet, Take half tablet twice daily x 1 week, then increase to half tablet in the morning and 1 tablet at bedtime x 1 week, then 1 tablet BID, Disp: 60 tablet, Rfl: 11   metolazone  (ZAROXOLYN ) 2.5 MG tablet, TAKE 1 TABLET BY MOUTH ONCE A WEEK, Disp: 4 tablet, Rfl: 1   midodrine  (PROAMATINE ) 5 MG tablet, Take 1 tablet (5 mg total) by mouth 3 (three) times daily. (Patient taking differently: Take 10 mg by mouth 3 (three) times daily.), Disp: 200 tablet, Rfl: 3   Multiple Vitamin (MULTIVITAMIN WITH MINERALS) TABS tablet, Take 1 tablet by mouth daily., Disp: , Rfl:    nystatin (MYCOSTATIN) 100000 UNIT/ML suspension, Take 5 mLs by mouth 4 (four) times daily., Disp: , Rfl:    Ondansetron  HCl (ZOFRAN  PO), 4 mg. Take 1 tablet on the tongue and allow to dissolve,  Disp: , Rfl:    phenol (CHLORASEPTIC) 1.4 % LIQD, Use as directed 1 spray in the mouth or throat as needed for throat irritation / pain., Disp: , Rfl:    potassium chloride  SA (KLOR-CON  M) 20 MEQ tablet, Take 3 tablets (60 mEq total) by mouth 2 (two) times daily., Disp: 572 tablet, Rfl: 3   risperiDONE  (RISPERDAL ) 0.5 MG tablet, Take 1 tablet (0.5 mg total) by mouth 2 (two) times daily at 8 am and 4 pm., Disp: 60 tablet, Rfl: 5   simvastatin  (ZOCOR ) 10 MG tablet, TAKE 1 TABLET EVERY EVENING, Disp: 90 tablet, Rfl: 3   spironolactone  (ALDACTONE ) 25 MG tablet, TAKE 1 TABLET (25 MG TOTAL) BY MOUTH EVERY EVENING., Disp: 90 tablet, Rfl: 3   tirzepatide (MOUNJARO) 7.5 MG/0.5ML Pen, Inject 7.5 mg into the skin once a week., Disp: 2 mL, Rfl: 0   torsemide  (DEMADEX ) 100 MG tablet, Take 1 tablet (100 mg total) by mouth 2 (two) times daily., Disp: 180 tablet, Rfl: 3 Allergies  Allergen Reactions   Ceftin Anaphylaxis    Face and throat swell    Cefuroxime Axetil Anaphylaxis    Face and throat swell   Geodon [Ziprasidone Hcl] Hives   Lisinopril  Other (See Comments)    angioedema   Cefuroxime     Other reaction(s): anaphylaxis    Sulfacetamide Sodium-Sulfur      Other reaction(s): itch   Allopurinol  Nausea Only and Other (See Comments)    weakness   Ativan  [Lorazepam ] Itching   Sulfa Antibiotics Itching   Valium  [Diazepam ] Other (See Comments)    Patient states that diazepam  doesn't relax, it has the opposite effect.     Social History   Socioeconomic History   Marital status: Married    Spouse name: Not on file   Number of children: 2   Years of education: 24   Highest education level: Master's degree (e.g., MA, MS, MEng, MEd, MSW, MBA)  Occupational History   Occupation: retired  Tobacco Use   Smoking status: Some Days    Current packs/day: 0.00    Average packs/day: 0.1 packs/day for 26.0 years (2.6 ttl pk-yrs)    Types: Cigarettes    Start date: 03/24/1995    Last attempt to quit: 03/23/2021    Years since quitting: 2.4   Smokeless tobacco: Never  Vaping Use   Vaping status: Never Used  Substance and Sexual Activity   Alcohol use: No   Drug use: No   Sexual activity: Yes  Other Topics Concern   Not on file  Social History Narrative   Tobacco Use Cigarettes: Former Smoker, Quit in 2008   No Alcohol   No recreational drug use   Diet: Regular/Low Carb   Exercise: None   Occupation: disabled   Education: Company secretary, masters   Children: 2   Firearms: No   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      Social Drivers of Health   Financial Resource Strain: Not on file  Food Insecurity: No Food Insecurity (09/02/2023)   Hunger Vital Sign    Worried About Running Out of Food in the Last Year: Never true    Ran Out of Food in the Last Year: Never true  Transportation Needs: No Transportation Needs (09/02/2023)   PRAPARE - Administrator, Civil Service (Medical): No    Lack of Transportation (Non-Medical): No  Physical Activity: Not on file  Stress: No Stress Concern Present (08/11/2022)   [name] of Occupational Health -  Occupational Stress Questionnaire    Feeling of Stress : Only a little  Social Connections: Not on file  Intimate Partner Violence: Not At Risk (09/02/2023)   Humiliation, Afraid, Rape, and Kick questionnaire    Fear of Current or Ex-Partner: No    Emotionally Abused: No    Physically Abused: No    Sexually Abused: No    Physical Exam      Future Appointments  Date Time Provider Department Center  09/17/2023  2:30 PM Arlyce Berger, RN [facility] None  09/20/2023  7:30 AM [facility] DEVICE REMOTES [facility]  09/22/2023  1:45 PM Luella Sager, DPM [facility]  09/23/2023  1:00 PM Alease Hunter D, LCSW [facility] None  09/23/2023  2:45 PM Thomasena Fleming, NP [facility]  10/13/2023  1:30 PM [facility] PA/NP [facility] None  12/13/2023  7:20 AM [facility] DEVICE REMOTES [facility]  01/11/2024  2:30 PM Reyna Cava K, DO [facility] None  03/09/2024  3:20 PM Arfeen, Bronson Canny, MD [facility] None  03/13/2024  7:10 AM [facility] DEVICE REMOTES [facility]  06/12/2024  7:10 AM [facility] DEVICE REMOTES [facility]  09/11/2024  7:10 AM [facility] DEVICE REMOTES [facility]  12/11/2024  7:10 AM [facility] DEVICE REMOTES [facility]  03/12/2025  7:10 AM [facility] DEVICE REMOTES [facility]

## 2023-09-14 LAB — CUP PACEART REMOTE DEVICE CHECK
Battery Remaining Longevity: 39 mo
Battery Voltage: 2.96 V
Brady Statistic AP VP Percent: 0.25 %
Brady Statistic AP VS Percent: 0.02 %
Brady Statistic AS VP Percent: 99.61 %
Brady Statistic AS VS Percent: 0.11 %
Brady Statistic RA Percent Paced: 0.27 %
Brady Statistic RV Percent Paced: 99.66 %
Date Time Interrogation Session: 20250421022722
HighPow Impedance: 92 Ohm
Implantable Lead Connection Status: 753985
Implantable Lead Connection Status: 753985
Implantable Lead Connection Status: 753985
Implantable Lead Implant Date: 20141210
Implantable Lead Implant Date: 20141210
Implantable Lead Implant Date: 20141210
Implantable Lead Location: 753858
Implantable Lead Location: 753859
Implantable Lead Location: 753860
Implantable Lead Model: 4396
Implantable Lead Model: 5076
Implantable Lead Model: 6935
Implantable Pulse Generator Implant Date: 20211118
Lead Channel Impedance Value: 399 Ohm
Lead Channel Impedance Value: 513 Ohm
Lead Channel Impedance Value: 532 Ohm
Lead Channel Impedance Value: 589 Ohm
Lead Channel Impedance Value: 722 Ohm
Lead Channel Impedance Value: 779 Ohm
Lead Channel Pacing Threshold Amplitude: 0.625 V
Lead Channel Pacing Threshold Amplitude: 0.875 V
Lead Channel Pacing Threshold Amplitude: 1.25 V
Lead Channel Pacing Threshold Pulse Width: 0.4 ms
Lead Channel Pacing Threshold Pulse Width: 0.4 ms
Lead Channel Pacing Threshold Pulse Width: 0.4 ms
Lead Channel Sensing Intrinsic Amplitude: 3 mV
Lead Channel Sensing Intrinsic Amplitude: 3 mV
Lead Channel Sensing Intrinsic Amplitude: 30.625 mV
Lead Channel Sensing Intrinsic Amplitude: 30.625 mV
Lead Channel Setting Pacing Amplitude: 2 V
Lead Channel Setting Pacing Amplitude: 2.5 V
Lead Channel Setting Pacing Amplitude: 2.5 V
Lead Channel Setting Pacing Pulse Width: 0.4 ms
Lead Channel Setting Pacing Pulse Width: 0.4 ms
Lead Channel Setting Sensing Sensitivity: 0.3 mV
Zone Setting Status: 755011
Zone Setting Status: 755011

## 2023-09-17 ENCOUNTER — Encounter: Payer: Self-pay | Admitting: *Deleted

## 2023-09-17 ENCOUNTER — Other Ambulatory Visit: Payer: Self-pay | Admitting: *Deleted

## 2023-09-17 ENCOUNTER — Other Ambulatory Visit: Payer: Self-pay

## 2023-09-17 NOTE — Addendum Note (Signed)
 Addended by: Arlyce Berger on: 09/17/2023 05:24 PM   Modules accepted: Orders

## 2023-09-17 NOTE — Patient Outreach (Signed)
 Complex Care Management   Visit Note  10/05/2023 updated entry for 09/17/23  Name:  Rhonda [name] MRN: [id number] DOB: 08-05-1950  Situation: Referral received for Complex Care Management related to Heart Failure and Dementia I obtained verbal consent from Caregiver and spouse .  Visit completed with Rhonda [name]  on the phone  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/2013   Body mass index (BMI) 45.0-49.9, adult 06/11/2020   Bowel incontinence 06/11/2020   Cardiomyopathy    Cholelithiasis 06/11/2020   Chronic respiratory failure 09/14/2013   Chronic sinusitis 06/11/2020   Chronic systolic heart failure 10/27/2012   a) NICM b) ECHO (03/2013) EF 20-25% c) ECHO (09/2013) EF 45-50%, grade I DD   CKD (chronic kidney disease) stage 3, GFR 30-59 ml/min 11/08/2018   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   Complication of anesthesia    History of low blood pressure after surgery; attributed to lying flat   Daytime somnolence 06/11/2020   Decreased estrogen level 08/01/2021   Diabetic neuropathy    Diarrhea of presumed infectious origin 06/11/2020   Edema 06/11/2020   Elevated troponin 12/02/2021   Endometrial polyp 01/20/2012   Exertional shortness of breath    Generalized anxiety disorder    GERD (gastroesophageal reflux disease)    Gout 06/11/2020   Heart murmur    History of colonic polyps 06/11/2020   History of fall 06/11/2020   Hyperlipidemia    Hyperparathyroidism 08/01/2021   Hypertension    Hypokalemia 11/08/2018   Hyponatremia 10/08/2021   Hypothyroidism     Insomnia 06/11/2020   Intractable nausea and vomiting 10/08/2021   Irritable bowel syndrome 01/04/2020   Left bundle branch block    s/p CRT-D (04/2013)   Lumbago without sciatica 04/30/2021   Lumbar spondylosis with myelopathy 06/11/2020   Macrocytosis 06/11/2020   Major depressive disorder    Malignant tumor of breast 03/26/2011   Left; completed chemotherapy and radiation treatments   Migraines    Mild dementia 04/20/2023   On home oxygen  therapy    "2L suppose to be q night" (05/03/2013)   Orthostatic hypotension 07/28/2017   Spinal stenosis of lumbar region 01/03/2020   Spondylolisthesis 08/01/2021   SVD (spontaneous vaginal delivery)    x 2   Type II diabetes mellitus 06/15/2019   Varicose veins of bilateral lower extremities with other complications 08/01/2021   Vitamin D  deficiency 03/26/2011    Assessment: Patient Reported Symptoms:  Cognitive Cognitive Status: Alert and oriented to person, place, and time, Normal speech and language skills Cognitive/Intellectual Conditions Management [RPT]: None reported or documented in medical history or problem list   Health Maintenance Behaviors: Annual physical exam, Spiritual practice(s), Stress management, Sleep adequate Healing Pattern: Average Health Facilitated by: Prayer/meditation, Stress management, Pain control  Neurological Neurological Review of Symptoms: Other: [place] Neurological Symptoms/Conditions [RPT]: Please attempt to complete your medicaid application at the DSS office, on line (computer, smart phone, a computer at the [employer] (free) so it can be used to help determined if extra support can be placed in the home. Neurological Conditions:  Dementia Neurological Management Strategies: Adequate rest, Medication therapy, Routine screening, Coping strategies Neurological Self-Management Outcome: 3 (uncertain)  HEENT HEENT Symptoms Reported: No symptoms reported HEENT Conditions: Vision problem(s) Vision  Problems: blindness/vision loss HEENT Management Strategies: Adequate rest, Coping strategies, Medical device, Routine screening HEENT Self-Management Outcome: 4 (good) Vision problem(s)  Cardiovascular Cardiovascular Symptoms Reported: Fatigue Does patient have uncontrolled Hypertension?: No Cardiovascular Conditions: Heart failure, Hypertension, Coronary artery disease Cardiovascular Management Strategies: Adequate rest, Coping strategies, Medication therapy, Routine screening Weight: 205 lb (93 kg) Cardiovascular Self-Management Outcome: 3 (uncertain)  Respiratory Respiratory Symptoms Reported: No symptoms reported Respiratory Conditions: Asthma, Seasonal allergies Respiratory Self-Management Outcome: 4 (good)  Endocrine Patient reports the following symptoms related to hypoglycemia or hyperglycemia : No symptoms reported Is patient diabetic?: Yes Is patient checking blood sugars at home?: Yes Endocrine Conditions: Diabetes, Thyroid  disorder Endocrine Management Strategies: Adequate rest, Coping strategies, Diet modification, Medical device, Medication therapy, Routine screening Endocrine Self-Management Outcome: 4 (good)  Gastrointestinal Gastrointestinal Symptoms Reported: No symptoms reported Gastrointestinal Conditions: Reflux/heartburn, Irritable bowel syndrome Gastrointestinal Management Strategies: Adequate rest, Coping strategies, Diet modification, Fluid modification, Incontinence garment/pad, Medication therapy Gastrointestinal Self-Management Outcome: 4 (good) Nutrition Risk Screen (CP): No indicators present  Genitourinary Genitourinary Symptoms Reported: Incontinence, Frequency Genitourinary Conditions: Frequency, Incontinence, Chronic kidney disease Genitourinary Management Strategies: Adequate rest, Fluid modification, Incontinence garment/pad, Medication therapy Genitourinary Self-Management Outcome: 3 (uncertain)  Integumentary Integumentary Symptoms Reported: No  symptoms reported Skin Management Strategies: Adequate rest, Routine screening Skin Self-Management Outcome: 4 (good)  Musculoskeletal Musculoskelatal Symptoms Reviewed: Difficulty walking, Muscle pain, Unsteady gait, Weakness Musculoskeletal Conditions: Joint pain, Mobility limited, Unsteady gait, Osteoarthritis Musculoskeletal Management Strategies: Adequate rest, Medical device, Medication therapy, Routine screening Musculoskeletal Self-Management Outcome: 3 (uncertain) Falls in the past year?: Yes Number of falls in past year: 2 or more Was there an injury with Fall?: Yes Fall Risk Category Calculator: 3 Patient Fall Risk Level: High Fall Risk Patient at Risk for Falls Due to: History of fall(s), Impaired balance/gait, Impaired mobility, Mental status change Fall risk Follow up: Falls prevention discussed  Psychosocial Psychosocial Symptoms Reported: No symptoms reported, Other Other Psychosocial Conditions: poor sleep Behavioral Health Conditions: Dementia, Depression, Anxiety, Other Other Behavorial Health Conditions: insomnia Behavioral Management Strategies: Coping strategies, Medication therapy, Support system, Adequate rest Behavioral Health Self-Management Outcome: 4 (good) Major Change/Loss/Stressor/Fears (CP): Medical condition, self Techniques to Cope with Loss/Stress/Change: Medication, Spiritual practice(s) Quality of Family Relationships: helpful, supportive Do you feel physically threatened by others?: No      09/17/2023    4:43 PM  Depression screen PHQ 2/9  Decreased Interest 0  Down, Depressed, Hopeless 1  PHQ - 2 Score 1    There were no vitals filed for this visit.  Medications Reviewed Today     Reviewed by Arlyce Berger, RN (Registered Nurse) on 09/17/23 at 1613  Med List Status: <None>   Medication Order Taking? Sig Documenting Provider Last Dose Status Informant  acetaminophen  (TYLENOL ) 500 MG tablet [id number]  Take 500 mg by mouth every 6 (six)  hours as needed. [provider]  Active   acetaZOLAMIDE  (DIAMOX ) 125 MG tablet [id number]  Take 1 tablet (125 mg total) by mouth daily. Patel, Donika K, DO  Active Self, Pharmacy Records  allopurinol  (ZYLOPRIM ) 100 MG tablet [id number]  Take 0.5 tablets (50 mg total) by mouth daily. [place], [place], [place]  Active   azelastine  (ASTELIN ) 0.1 % nasal spray [id number]  Place 2 sprays into both nostrils 2 (two) times daily. Use in each nostril  as directed Soldatova, Liuba, MD  Active   buPROPion  (WELLBUTRIN  XL) 150 MG 24 hr tablet [id number]  Take 1 tablet (150 mg total) by mouth daily. Arfeen, Syed T, MD  Active   carvedilol  (COREG ) 3.125 MG tablet [id number]  TAKE 1 TABLET (3.125 MG) BY MOUTH TWICE DAILY WITH MEALS Arleene Belt, PA-C  Active   citalopram  (CELEXA ) 20 MG tablet [id number]  Take 1 tablet (20 mg total) by mouth daily. Arfeen, Syed T, MD  Active   clonazePAM  (KLONOPIN ) 0.5 MG tablet [id number]  Take 1 tablet (0.5 mg total) by mouth at bedtime. Arfeen, Syed T, MD  Active   cyanocobalamin  (VITAMIN B12) 1000 MCG tablet [id number]  Take 1,000 mcg by mouth daily. [provider]  Active Self, Pharmacy Records  desloratadine  (CLARINEX ) 5 MG tablet [id number]  Take 1 tablet (5 mg total) by mouth daily. Soldatova, Liuba, MD  Active            Med Note Merrilyn Abler, [facility] Feb 15, 2023  2:56 PM)    dicyclomine  (BENTYL ) 20 MG tablet [id number]  Take 20 mg by mouth 3 (three) times daily before meals. [provider]  Active Self, Pharmacy Records  famotidine (PEPCID) 20 MG tablet [id number]  Take 20 mg by mouth 2 (two) times daily. [provider]  Active            Med Note Gaetano Jordan, JENNA B   Wed Jul 14, 2023 10:08 AM) As needed   fluticasone  (FLONASE ) 50 MCG/ACT nasal spray [id number]  Place 1 spray into both nostrils daily. [provider]  Active Self, Pharmacy Records  gabapentin  (NEURONTIN ) 100 MG capsule [id number]  TAKE 1 CAPSULE (100 MG TOTAL) BY  MOUTH 2 (TWO) TIMES DAILY. Patel, Donika K, DO  Active   levothyroxine  (SYNTHROID ) 88 MCG tablet [id number]  TAKE 1 TABLET(88 MCG) BY MOUTH DAILY BEFORE Basil Lim, MD  Active Self, Pharmacy Records  memantine  (NAMENDA ) 10 MG tablet [id number]  Take half tablet twice daily x 1 week, then increase to half tablet in the morning and 1 tablet at bedtime x 1 week, then 1 tablet BID Lydia Sams, Donika K, DO  Active   metolazone  (ZAROXOLYN ) 2.5 MG tablet [id number]  TAKE 1 TABLET BY MOUTH ONCE A WEEK Bensimhon, Rheta Celestine, MD  Active   midodrine  (PROAMATINE ) 5 MG tablet [id number]  Take 1 tablet (5 mg total) by mouth 3 (three) times daily.  Patient taking differently: Take 10 mg by mouth 3 (three) times daily.   Ruddy Corral M, PA-C  Active   Multiple Vitamin (MULTIVITAMIN WITH MINERALS) TABS tablet [id number]  Take 1 tablet by mouth daily. [provider]  Active Self, Pharmacy Records  nystatin (MYCOSTATIN) 100000 UNIT/ML suspension [id number]  Take 5 mLs by mouth 4 (four) times daily. [provider]  Active   Ondansetron  HCl (ZOFRAN  PO) [id number]  4 mg. Take 1 tablet on the tongue and allow to dissolve [provider]  Active            Med Note Merrilyn Abler, [facility] K   Mon Jan 04, 2023 12:24 PM) Taking PRN   phenol (CHLORASEPTIC) 1.4 % LIQD [id number]  Use as directed 1 spray in the mouth or throat as needed for throat irritation / pain. [provider]  Active   potassium chloride  SA (KLOR-CON  M) 20 MEQ tablet [id number]  Take 3 tablets (60 mEq total) by mouth 2 (two) times daily. Sabharwal, Aditya, DO  Active  risperiDONE  (RISPERDAL ) 0.5 MG tablet [id number]  Take 1 tablet (0.5 mg total) by mouth 2 (two) times daily at 8 am and 4 pm. Arturo Late, MD  Active   simvastatin  (ZOCOR ) 10 MG tablet [id number]  TAKE 1 TABLET EVERY EVENING Bensimhon, Rheta Celestine, MD  Active   spironolactone  (ALDACTONE ) 25 MG tablet [id number]  TAKE 1 TABLET (25 MG TOTAL) BY MOUTH EVERY  EVENING. Bensimhon, Daniel R, MD  Active   tirzepatide [facility]) 7.5 MG/0.5ML Pen [id number]  Inject 7.5 mg into the skin once a week. Bensimhon, Rheta Celestine, MD  Active   torsemide  (DEMADEX ) 100 MG tablet [id number]  Take 1 tablet (100 mg total) by mouth 2 (two) times daily. Elmarie Hacking, [place]  Active             Recommendation:   PCP Follow-up Please attempt to complete your medicaid application at the DSS office, on line (computer, smart phone, a computer at the [employer] (free) so it can be used to help determined if extra support can be placed in the home.   Follow Up Plan:   Patient will be warm transferred to [facility] at [place] new office RN CCM for 10/29/23 0930  [name] L. Mcarthur Speedy, RN, BSN, CCM [place]  [facility], [facility] RN Care Manager Direct Dial: [phone]  Fax: [phone]

## 2023-09-20 ENCOUNTER — Ambulatory Visit: Attending: Internal Medicine

## 2023-09-20 DIAGNOSIS — Z95810 Presence of automatic (implantable) cardiac defibrillator: Secondary | ICD-10-CM | POA: Diagnosis not present

## 2023-09-20 DIAGNOSIS — I5022 Chronic systolic (congestive) heart failure: Secondary | ICD-10-CM

## 2023-09-21 ENCOUNTER — Other Ambulatory Visit (HOSPITAL_COMMUNITY): Payer: Self-pay

## 2023-09-22 ENCOUNTER — Encounter: Payer: Self-pay | Admitting: Podiatry

## 2023-09-22 ENCOUNTER — Ambulatory Visit: Payer: Medicare HMO | Admitting: Podiatry

## 2023-09-22 DIAGNOSIS — E1142 Type 2 diabetes mellitus with diabetic polyneuropathy: Secondary | ICD-10-CM

## 2023-09-22 DIAGNOSIS — M79675 Pain in left toe(s): Secondary | ICD-10-CM | POA: Diagnosis not present

## 2023-09-22 DIAGNOSIS — M79674 Pain in right toe(s): Secondary | ICD-10-CM

## 2023-09-22 DIAGNOSIS — B351 Tinea unguium: Secondary | ICD-10-CM

## 2023-09-23 ENCOUNTER — Other Ambulatory Visit: Payer: Self-pay | Admitting: Licensed Clinical Social Worker

## 2023-09-23 ENCOUNTER — Ambulatory Visit: Attending: Pulmonary Disease | Admitting: Pulmonary Disease

## 2023-09-23 ENCOUNTER — Other Ambulatory Visit (HOSPITAL_COMMUNITY): Payer: Self-pay

## 2023-09-23 ENCOUNTER — Encounter: Payer: Self-pay | Admitting: Pulmonary Disease

## 2023-09-23 VITALS — BP 119/82 | HR 86 | Ht 62.0 in | Wt 209.4 lb

## 2023-09-23 DIAGNOSIS — I5022 Chronic systolic (congestive) heart failure: Secondary | ICD-10-CM | POA: Diagnosis not present

## 2023-09-23 DIAGNOSIS — Z95810 Presence of automatic (implantable) cardiac defibrillator: Secondary | ICD-10-CM | POA: Diagnosis not present

## 2023-09-23 DIAGNOSIS — I428 Other cardiomyopathies: Secondary | ICD-10-CM | POA: Diagnosis not present

## 2023-09-23 NOTE — Patient Instructions (Signed)
 Medication Instructions:  Your physician recommends that you continue on your current medications as directed. Please refer to the Current Medication list given to you today.  *If you need a refill on your cardiac medications before your next appointment, please call your pharmacy*  Lab Work: None ordered If you have labs (blood work) drawn today and your tests are completely normal, you will receive your results only by: MyChart Message (if you have MyChart) OR A paper copy in the mail If you have any lab test that is abnormal or we need to change your treatment, we will call you to review the results.  Follow-Up: At [facility], you and your health needs are our priority.  As part of our continuing mission to provide you with exceptional heart care, our providers are all part of one team.  This team includes your primary Cardiologist (physician) and Advanced Practice Providers or APPs (Physician Assistants and Nurse Practitioners) who all work together to provide you with the care you need, when you need it.  Your next appointment:   6 month(s)  Provider:   You may see Manya Sells, MD or one of the following Advanced Practice Providers on your designated Care Team:   Mertha Abrahams, [place] "Jonelle Neri" [place], [place] Creighton Doffing, NP

## 2023-09-23 NOTE — Progress Notes (Signed)
 Made in error

## 2023-09-23 NOTE — Progress Notes (Signed)
 Paramedicine Encounter    Patient ID: Rhonda [name], female    DOB: December 26, 1950, 73 y.o.   MRN: [id number]   Complaints- Sleepy with a headache   Assessment- CAOX4, warm and dry laying in bed,   Compliance with meds-   Pill box filled- for one week   Refills needed- carvedilol  and bentyl    Meds changes since last visit- none     Social changes- none    VISIT SUMMARY- Arrived for home visit for [name] who reports to be feeling sleepy with a headache. She says she has used tylenol  with minimal relief. She continues to be more sedentary and depressed- we discussed this and her hopes to get out and about but limited family and friends to help. I reminded her and her husband about pace. I obtained vitals and assessment. No lower leg swelling, lungs clear. Vitals within normal. She did not weigh today- I reminded her the importance of weighing daily. I reviewed meds and filled pill box for one week. I reviewed upcoming appointments and wrote down reminders. Home visit complete. I will see [name] in one week.    BP (!) 102/58   Pulse 75   Resp 16   SpO2 98%      ACTION: Home visit completed     Patient Care Team: Ransom Byers, MD as PCP - General (Family Medicine) Tammie Fall, MD as PCP - Electrophysiology (Cardiology) Denton Flakes, LCSW as Social Worker (Licensed Clinical Social Worker) Lydia Sams, Michele Ahle, DO as Consulting Physician (Neurology) Roberts Ching, Paramedic as Paramedic Donia Furlough, Ancel Kass, MD as Attending Physician (Family Medicine) Bensimhon, Rheta Celestine, MD as Consulting Physician (Cardiology) Carlos Chesterfield, Bronson Canny, MD as Consulting Physician (Psychiatry) Velma Ghazi, DPM as Consulting Physician (Podiatry) Adriana Albany, LCSW as VBCI Care Management (Licensed Clinical Social Worker) Adriana Albany, LCSW (Licensed Clinical Social Worker) Arlyce Berger, RN as VBCI Care Management Thomasena Fleming, NP as Nurse Practitioner  (Cardiology) Luella Sager, DPM as Consulting Physician (Podiatry)  Patient Active Problem List   Diagnosis Date Noted   Mild dementia 04/20/2023   Diabetic neuropathy    Elevated troponin 12/02/2021   Acute left ankle pain 10/11/2021   Hyponatremia 10/08/2021   Decreased estrogen level 08/01/2021   Hyperparathyroidism 08/01/2021   Spondylolisthesis 08/01/2021   Varicose veins of bilateral lower extremities with other complications 08/01/2021   Lumbago without sciatica 04/30/2021   Abnormal gait 06/11/2020   Allergic rhinitis 06/11/2020   Asthma 06/11/2020   Benign intracranial hypertension 06/11/2020   Body mass index (BMI) 45.0-49.9, adult 06/11/2020   Bowel incontinence 06/11/2020   Cholelithiasis 06/11/2020   Chronic sinusitis 06/11/2020   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   Daytime somnolence 06/11/2020   Edema 06/11/2020   Family history of malignant neoplasm of gastrointestinal tract 06/11/2020   Gout 06/11/2020   Insomnia 06/11/2020   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   Lumbar spondylosis with myelopathy 06/11/2020   Macrocytosis 06/11/2020   History of colonic polyps 06/11/2020   Repeated falls 06/11/2020   Irritable bowel syndrome 01/04/2020   Spinal stenosis of lumbar region 01/03/2020   Bilateral leg weakness 08/01/2019   Type II diabetes mellitus 06/15/2019   Hypokalemia 11/08/2018   CKD (chronic kidney disease) stage 3, GFR 30-59 ml/min 11/08/2018   Orthostatic hypotension 07/28/2017   On home oxygen  therapy    Migraines    Left bundle branch block    Hypothyroidism    Hypertension    Hyperlipidemia    Heart  murmur    GERD (gastroesophageal reflux disease)    Exertional shortness of breath    Major depressive disorder    Back pain    Arthritis    Generalized anxiety disorder    Anemia    Chronic respiratory failure 09/14/2013   Biventricular ICD (implantable cardioverter-defibrillator) in place 08/04/2013   Chronic systolic heart failure 10/27/2012    Endometrial polyp 01/20/2012   Malignant tumor of breast 03/26/2011   Vitamin D  deficiency 03/26/2011    Current Outpatient Medications:    acetaminophen  (TYLENOL ) 500 MG tablet, Take 500 mg by mouth every 6 (six) hours as needed., Disp: , Rfl:    acetaZOLAMIDE  (DIAMOX ) 125 MG tablet, Take 1 tablet (125 mg total) by mouth daily., Disp: 90 tablet, Rfl: 3   albuterol  (VENTOLIN  HFA) 108 (90 Base) MCG/ACT inhaler, INHALE TWO PUFFS BY MOUTH EVERY 4 HOURS AS NEEDED, Disp: , Rfl:    allopurinol  (ZYLOPRIM ) 100 MG tablet, Take 0.5 tablets (50 mg total) by mouth daily., Disp: 45 tablet, Rfl: 1   amoxicillin  (AMOXIL ) 875 MG tablet, TAKE 1 TABLET BY MOUTH TWICE A DAY 7 DAYS, Disp: , Rfl:    azelastine  (ASTELIN ) 0.1 % nasal spray, Place 2 sprays into both nostrils 2 (two) times daily. Use in each nostril as directed, Disp: 30 mL, Rfl: 12   buPROPion  (WELLBUTRIN  XL) 150 MG 24 hr tablet, Take 1 tablet (150 mg total) by mouth daily., Disp: 30 tablet, Rfl: 5   carvedilol  (COREG ) 3.125 MG tablet, TAKE 1 TABLET (3.125 MG) BY MOUTH TWICE DAILY WITH MEALS, Disp: 180 tablet, Rfl: 3   citalopram  (CELEXA ) 20 MG tablet, Take 1 tablet (20 mg total) by mouth daily., Disp: 30 tablet, Rfl: 5   clonazePAM  (KLONOPIN ) 0.5 MG tablet, Take 1 tablet (0.5 mg total) by mouth at bedtime., Disp: 30 tablet, Rfl: 5   cyanocobalamin  (VITAMIN B12) 1000 MCG tablet, Take 1,000 mcg by mouth daily., Disp: , Rfl:    desloratadine  (CLARINEX ) 5 MG tablet, Take 1 tablet (5 mg total) by mouth daily., Disp: 90 tablet, Rfl: 3   dicyclomine  (BENTYL ) 20 MG tablet, Take 20 mg by mouth 3 (three) times daily before meals., Disp: , Rfl:    famotidine (PEPCID) 20 MG tablet, Take 20 mg by mouth 2 (two) times daily., Disp: , Rfl:    fluticasone  (FLONASE ) 50 MCG/ACT nasal spray, Place 1 spray into both nostrils daily., Disp: , Rfl:    gabapentin  (NEURONTIN ) 100 MG capsule, TAKE 1 CAPSULE (100 MG TOTAL) BY MOUTH 2 (TWO) TIMES DAILY., Disp: 180 capsule, Rfl:  0   levothyroxine  (SYNTHROID ) 88 MCG tablet, TAKE 1 TABLET(88 MCG) BY MOUTH DAILY BEFORE BREAKFAST, Disp: 90 tablet, Rfl: 3   memantine  (NAMENDA ) 10 MG tablet, Take half tablet twice daily x 1 week, then increase to half tablet in the morning and 1 tablet at bedtime x 1 week, then 1 tablet BID, Disp: 60 tablet, Rfl: 11   memantine  (NAMENDA ) 5 MG tablet, Take 1 tablet by mouth daily., Disp: , Rfl:    metolazone  (ZAROXOLYN ) 2.5 MG tablet, TAKE 1 TABLET BY MOUTH ONCE A WEEK, Disp: 4 tablet, Rfl: 1   metolazone  (ZAROXOLYN ) 2.5 MG tablet, Take 1 tablet by mouth once a week., Disp: , Rfl:    midodrine  (PROAMATINE ) 5 MG tablet, Take 1 tablet (5 mg total) by mouth 3 (three) times daily. (Patient taking differently: Take 10 mg by mouth 3 (three) times daily.), Disp: 200 tablet, Rfl: 3   Multiple Vitamin (MULTIVITAMIN  WITH MINERALS) TABS tablet, Take 1 tablet by mouth daily., Disp: , Rfl:    nystatin (MYCOSTATIN) 100000 UNIT/ML suspension, Take 5 mLs by mouth 4 (four) times daily., Disp: , Rfl:    Ondansetron  HCl (ZOFRAN  PO), 4 mg. Take 1 tablet on the tongue and allow to dissolve, Disp: , Rfl:    phenol (CHLORASEPTIC) 1.4 % LIQD, Use as directed 1 spray in the mouth or throat as needed for throat irritation / pain., Disp: , Rfl:    potassium chloride  SA (KLOR-CON  M) 20 MEQ tablet, Take 3 tablets (60 mEq total) by mouth 2 (two) times daily., Disp: 572 tablet, Rfl: 3   risperiDONE  (RISPERDAL ) 0.5 MG tablet, Take 1 tablet (0.5 mg total) by mouth 2 (two) times daily at 8 am and 4 pm., Disp: 60 tablet, Rfl: 5   simvastatin  (ZOCOR ) 10 MG tablet, TAKE 1 TABLET EVERY EVENING, Disp: 90 tablet, Rfl: 3   spironolactone  (ALDACTONE ) 25 MG tablet, TAKE 1 TABLET (25 MG TOTAL) BY MOUTH EVERY EVENING., Disp: 90 tablet, Rfl: 3   tirzepatide (MOUNJARO) 2.5 MG/0.5ML Pen, INJECT 2.5 MG INTO THE SKIN ONCE A WEEK., Disp: , Rfl:    tirzepatide (MOUNJARO) 5 MG/0.5ML Pen, INJECT 5 MG INTO THE SKIN ONCE A WEEK., Disp: , Rfl:     tirzepatide (MOUNJARO) 7.5 MG/0.5ML Pen, Inject 7.5 mg into the skin once a week., Disp: 2 mL, Rfl: 0   torsemide  (DEMADEX ) 100 MG tablet, Take 1 tablet (100 mg total) by mouth 2 (two) times daily., Disp: 180 tablet, Rfl: 3   torsemide  (DEMADEX ) 100 MG tablet, Take 1 tablet by mouth 2 (two) times daily. (Patient not taking: Reported on 09/23/2023), Disp: , Rfl:    torsemide  (DEMADEX ) 20 MG tablet, TAKE 5 TABLETS (100 MG TOTAL) BY MOUTH 2 (TWO) TIMES DAILY. (Patient not taking: Reported on 09/23/2023), Disp: , Rfl:  Allergies  Allergen Reactions   Ceftin Anaphylaxis    Face and throat swell    Cefuroxime Axetil Anaphylaxis    Face and throat swell   Geodon [Ziprasidone Hcl] Hives   Lisinopril  Other (See Comments)    angioedema  lisinopril    Shellfish-Derived Products Other (See Comments)    Other reaction(s): Other  shellfish derived   Cefuroxime     Other reaction(s): anaphylaxis   Sulfacetamide Sodium-Sulfur      Other reaction(s): itch   Allopurinol  Nausea Only and Other (See Comments)    weakness   Ativan  [Lorazepam ] Itching   Diazepam  Other (See Comments)    Patient states that diazepam  doesn't relax, it has the opposite effect.  Valium    Sulfa Antibiotics Itching     Social History   Socioeconomic History   Marital status: Married    Spouse name: Gaylin Ke   Number of children: 2   Years of education: 18   Highest education level: Master's degree (e.g., MA, MS, MEng, MEd, MSW, MBA)  Occupational History   Occupation: retired  Tobacco Use   Smoking status: Every Day    Current packs/day: 0.00    Average packs/day: 0.5 packs/day for 26.0 years (13.0 ttl pk-yrs)    Types: Cigarettes    Start date: 03/24/1995    Last attempt to quit: 03/23/2021    Years since quitting: 2.5   Smokeless tobacco: Never  Vaping Use   Vaping status: Never Used  Substance and Sexual Activity   Alcohol use: No   Drug use: No   Sexual activity: Yes  Other Topics Concern   Not on file  Social History Narrative   Tobacco Use Cigarettes: Former Smoker, Quit in 2008   No Alcohol   No recreational drug use   Diet: Regular/Low Carb   Exercise: None   Occupation: disabled   Education: Company secretary, masters   Children: 2   Firearms: No   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   Lives with spouse Gaylin Ke      Social Drivers of Health   Financial Resource Strain: Low Risk  (09/17/2023)   Overall Financial Resource Strain (CARDIA)    Difficulty of Paying Living Expenses: Not very hard  Food Insecurity: No Food Insecurity (09/17/2023)   Hunger Vital Sign    Worried About Running Out of Food in the Last Year: Never true    Ran Out of Food in the Last Year: Never true  Transportation Needs: No Transportation Needs (09/17/2023)   PRAPARE - Administrator, Civil Service (Medical): No    Lack of Transportation (Non-Medical): No  Physical Activity: Insufficiently Active (09/17/2023)   Exercise Vital Sign    Days of Exercise per Week: 1 day    Minutes of Exercise per Session: 20 min  Stress: Stress Concern Present (09/17/2023)   [name] of Occupational Health - Occupational Stress Questionnaire    Feeling of Stress : To some extent  Social Connections: Socially Integrated (09/17/2023)   Social Connection and Isolation Panel [NHANES]    Frequency of Communication with Friends and Family: Twice a week    Frequency of Social Gatherings with Friends and Family: Twice a week    Attends Religious Services: 1 to 4 times per year    Active Member of Clubs or Organizations: Patient unable to answer    Attends Banker Meetings: 1 to 4 times per year    Marital Status: Married  Catering manager Violence: Not At Risk (09/17/2023)   Humiliation, Afraid, Rape, and Kick questionnaire    Fear of Current or Ex-Partner: No    Emotionally Abused: No    Physically Abused: No    Sexually Abused: No    Physical  Exam      Future Appointments  Date Time Provider Department Center  10/13/2023  1:30 PM [facility] PA/NP [facility] None  10/14/2023  1:00 PM Alease Hunter D, LCSW [facility] None  12/13/2023  7:20 AM [facility] DEVICE REMOTES [facility]  12/29/2023  3:00 PM Luella Sager, DPM [facility]  01/11/2024  2:30 PM Reyna Cava K, DO [facility] None  03/09/2024  3:20 PM Arfeen, Bronson Canny, MD [facility] None  03/13/2024  7:10 AM CVD HVT DEVICE REMOTES [facility] H&V  06/12/2024  7:10 AM CVD HVT DEVICE REMOTES [facility] H&V  09/11/2024  7:10 AM CVD HVT DEVICE REMOTES [facility] H&V  12/11/2024  7:10 AM CVD HVT DEVICE REMOTES [facility] H&V  03/12/2025  7:10 AM CVD HVT DEVICE REMOTES [facility] H&V

## 2023-09-23 NOTE — Patient Instructions (Signed)
 Visit Information  Thank you for taking time to visit with me today. Please don't hesitate to contact me if I can be of assistance to you before our next scheduled appointment.  Your next care management appointment is by telephone on 5/22 at 1 PM  Please call the care guide team at [phone] if you need to cancel, schedule, or reschedule an appointment.   Please call the Suicide and Crisis Lifeline: 988 go to [facility] [address], [place] [phone]) call 911 if you are experiencing a Mental Health or Behavioral Health Crisis or need someone to talk to.  Alease Hunter, LCSW [place]  [facility], [facility] Clinical Social Worker Direct Dial: [phone]  Fax: [phone] Website: [email or web address] 1:53 PM

## 2023-09-23 NOTE — Progress Notes (Signed)
  Electrophysiology Office Note:   Date:  09/23/2023  ID:  Rhonda [name], DOB [birth year], MRN [id number]  Primary Cardiologist: None Primary Heart Failure: None Electrophysiologist: Manya Sells, MD       History of Present Illness:   Rhonda [name] is a 73 y.o. 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   Since last being seen in our clinic the patient reports doing well overall. No device related complaints. She is accompanied by her husband of 46 years.  She reports she has been started on Mounjaro and is losing weight > down from 220 a couple of months ago to 209 on this visit.   She denies chest pain, palpitations, dyspnea, PND, orthopnea, nausea, vomiting, dizziness, syncope, edema, weight gain, or early satiety.   Review of systems complete and found to be negative unless listed in HPI.   EP Information / Studies Reviewed:    EKG is not ordered today. EKG from 08/03/2023 reviewed which showed ASVP 80 bpm      ICD Interrogation-  reviewed in detail today,  See PACEART report.  Device History: Medtronic BiV ICD implanted 05/03/2013 for HFrEF, LBBB Generator Change 04/11/2020 History of appropriate therapy: No History of AAD therapy: No   Studies:  ECHO 12/2022 > LVEF 55-60%, G1DD, trivial MV regurgitation       Physical Exam:   VS:  BP 119/82 (BP Location: Left Arm, Patient Position: Sitting, Cuff Size: Large)   Pulse 86   Ht 5\' 2"  (1.575 m)   Wt 209 lb 6.4 oz (95 kg)   SpO2 94%   BMI 38.30 kg/m    Wt Readings from Last 3 Encounters:  09/23/23 209 lb 6.4 oz (95 kg)  09/13/23 211 lb (95.7 kg)  09/07/23 210 lb (95.3 kg)     GEN: pleasant  adult female, well nourished, well developed in no acute distress NECK: No JVD; No carotid bruits CARDIAC: Regular rate and rhythm, no murmurs, rubs, gallops RESPIRATORY:  Clear to auscultation without rales, wheezing or rhonchi  ABDOMEN: Soft, non-tender, non-distended EXTREMITIES:  No edema; No deformity   ASSESSMENT AND PLAN:    Chronic Systolic Dysfunction / NICM s/p Medtronic CRT-D  -euvolemic on exam, though device suggestive of volume > consider re-calibration of OptiVol at next visit in 6 months given weight loss if does not correlate with exam  -Stable on an appropriate medical regimen -Normal ICD function -See Pace Art report -99.3% BiV pacing  -B>AX corrected on device   Hypertension  -well controlled on current regimen     Disposition:   Follow up with Dr. Carolynne Citron in 6 months   Signed, Creighton Doffing, NP-C, AGACNP-BC [facility] - Electrophysiology  09/23/2023, 4:57 PM

## 2023-09-23 NOTE — Patient Outreach (Signed)
 Complex Care Management   Visit Note  09/23/2023  Name:  Rhonda [name] MRN: [id number] DOB: 1950-06-21  Situation: Referral received for Complex Care Management related to [facility]/Behavioral Health diagnosis Depression/Anxiety  I obtained verbal consent from Patient.  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   Body mass index (BMI) 45.0-49.9, adult 06/11/2020   Bowel incontinence 06/11/2020   Cardiomyopathy    Cholelithiasis 06/11/2020   Chronic respiratory failure 09/14/2013   Chronic sinusitis 06/11/2020   Chronic systolic heart failure 10/27/2012   a) NICM b) ECHO (03/2013) EF 20-25% c) ECHO (09/2013) EF 45-50%, grade I DD   CKD (chronic kidney disease) stage 3, GFR 30-59 ml/min 11/08/2018   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   Complication of anesthesia    History of low blood pressure after surgery; attributed to lying flat   Daytime somnolence 06/11/2020   Decreased estrogen level 08/01/2021   Diabetic neuropathy    Diarrhea of presumed infectious origin 06/11/2020   Edema 06/11/2020   Elevated troponin 12/02/2021   Endometrial polyp 01/20/2012   Exertional shortness of breath    Generalized anxiety disorder    GERD (gastroesophageal reflux disease)    Gout 06/11/2020   Heart murmur    History of colonic polyps 06/11/2020   History of fall 06/11/2020   Hyperlipidemia    Hyperparathyroidism 08/01/2021   Hypertension    Hypokalemia 11/08/2018   Hyponatremia 10/08/2021   Hypothyroidism    Insomnia  06/11/2020   Intractable nausea and vomiting 10/08/2021   Irritable bowel syndrome 01/04/2020   Left bundle branch block    s/p CRT-D (04/2013)   Lumbago without sciatica 04/30/2021   Lumbar spondylosis with myelopathy 06/11/2020   Macrocytosis 06/11/2020   Major depressive disorder    Malignant tumor of breast 03/26/2011   Left; completed chemotherapy and radiation treatments   Migraines    Mild dementia 04/20/2023   On home oxygen  therapy    "2L suppose to be q night" (05/03/2013)   Orthostatic hypotension 07/28/2017   Spinal stenosis of lumbar region 01/03/2020   Spondylolisthesis 08/01/2021   SVD (spontaneous vaginal delivery)    x 2   Type II diabetes mellitus 06/15/2019   Varicose veins of bilateral lower extremities with other complications 08/01/2021   Vitamin D  deficiency 03/26/2011    Assessment: Patient Reported Symptoms:  Cognitive Cognitive Status: Alert and oriented to person, place, and time, Normal speech and language skills (per spouse)      Neurological Neurological Review of Symptoms: No symptoms reported    HEENT HEENT Symptoms Reported: No symptoms reported      Cardiovascular Cardiovascular Symptoms Reported: Not assessed    Respiratory Respiratory Symptoms Reported: No symptoms reported    Endocrine Patient reports the following symptoms related to hypoglycemia or hyperglycemia : No symptoms reported    Gastrointestinal Gastrointestinal Symptoms Reported: No symptoms reported      Genitourinary Genitourinary Symptoms Reported: No symptoms reported    Integumentary Integumentary Symptoms Reported: No symptoms reported  Musculoskeletal Musculoskelatal Symptoms Reviewed: No symptoms reported        Psychosocial Psychosocial Symptoms Reported: No symptoms reported Additional Psychological Details: Spouse shared patient did not obtain quality sleep last night. Had to r/s PCP appt this morning; however, Paramedicine EMT, [name], visited to  assist in tx planning. Patient is feeling better and will attend remaining appt with Cardiology Behavioral Health Conditions: Dementia, Depression Behavioral Management Strategies: Adequate rest, Support system, Coping strategies, Medication therapy Major Change/Loss/Stressor/Fears (CP): Medical condition, self Techniques to Cope with Loss/Stress/Change: Medication Quality of Family Relationships: helpful, involved, supportive      09/17/2023    4:43 PM  Depression screen PHQ 2/9  Decreased Interest 0  Down, Depressed, Hopeless 1  PHQ - 2 Score 1    There were no vitals filed for this visit.  Medications Reviewed Today     Reviewed by Adriana Albany, LCSW (Social Worker) on 09/23/23 at 1346  Med List Status: <None>   Medication Order Taking? Sig Documenting Provider Last Dose Status Informant  acetaminophen  (TYLENOL ) 500 MG tablet [id number] No Take 500 mg by mouth every 6 (six) hours as needed. [provider] Taking Active   acetaZOLAMIDE  (DIAMOX ) 125 MG tablet [id number] No Take 1 tablet (125 mg total) by mouth daily. Patel, Donika K, DO Taking Active Self, Pharmacy Records  albuterol  (VENTOLIN  HFA) 108 [phone] Base) MCG/ACT inhaler [id number] No INHALE TWO PUFFS BY MOUTH EVERY 4 HOURS AS NEEDED [provider] Taking Active   allopurinol  (ZYLOPRIM ) 100 MG tablet [id number] No Take 0.5 tablets (50 mg total) by mouth daily. [place], Arlice Bene, FNP Taking Active   amoxicillin  (AMOXIL ) 875 MG tablet [id number] No TAKE 1 TABLET BY MOUTH TWICE A DAY 7 DAYS [provider] Taking Active   azelastine  (ASTELIN ) 0.1 % nasal spray [id number] No Place 2 sprays into both nostrils 2 (two) times daily. Use in each nostril as directed Artice Last, MD Taking Active   buPROPion  (WELLBUTRIN  XL) 150 MG 24 hr tablet [id number] No Take 1 tablet (150 mg total) by mouth daily. Arfeen, Syed T, MD Taking Active   carvedilol  (COREG ) 3.125 MG tablet [id number] No TAKE 1 TABLET (3.125 MG)  BY MOUTH TWICE DAILY WITH MEALS Arleene Belt, PA-C Taking Active   citalopram  (CELEXA ) 20 MG tablet [id number] No Take 1 tablet (20 mg total) by mouth daily. Arfeen, Syed T, MD Taking Active   clonazePAM  (KLONOPIN ) 0.5 MG tablet [id number] No Take 1 tablet (0.5 mg total) by mouth at bedtime. Arturo Late, MD Taking Active   cyanocobalamin  (VITAMIN B12) 1000 MCG tablet [id number] No Take 1,000 mcg by mouth daily. [provider] Taking Active Self, Pharmacy Records  desloratadine  (CLARINEX ) 5 MG tablet [id number] No Take 1 tablet (5 mg total) by mouth daily. Soldatova, Liuba, MD Taking Active            Med Note Merrilyn Abler, [facility] Feb 15, 2023  2:56 PM)    dicyclomine  (BENTYL ) 20 MG tablet [id number] No Take 20 mg by mouth 3 (three) times daily before meals. [provider] Taking Active Self, Pharmacy Records  famotidine (PEPCID) 20 MG tablet [id number] No Take 20 mg by mouth 2 (two) times daily. [provider] Taking Active            Med Note Gaetano Jordan, JENNA B   Wed Jul 14, 2023 10:08 AM) As needed   fluticasone  (FLONASE ) 50 MCG/ACT nasal spray [id number] No Place 1 spray into both nostrils  daily. [provider] Taking Active Self, Pharmacy Records  gabapentin  (NEURONTIN ) 100 MG capsule [id number] No TAKE 1 CAPSULE (100 MG TOTAL) BY MOUTH 2 (TWO) TIMES DAILY. Patel, Donika K, DO Taking Active   levothyroxine  (SYNTHROID ) 88 MCG tablet [id number] No TAKE 1 TABLET(88 MCG) BY MOUTH DAILY BEFORE Basil Lim, MD Taking Active Self, Pharmacy Records  memantine  (NAMENDA ) 10 MG tablet [id number] No Take half tablet twice daily x 1 week, then increase to half tablet in the morning and 1 tablet at bedtime x 1 week, then 1 tablet BID Patel, Donika K, DO Taking Active   memantine  (NAMENDA ) 5 MG tablet [id number] No Take 1 tablet by mouth daily. [provider] Taking Active   metolazone  (ZAROXOLYN ) 2.5 MG tablet [id number] No TAKE 1 TABLET BY  MOUTH ONCE A WEEK Bensimhon, Rheta Celestine, MD Taking Active   metolazone  (ZAROXOLYN ) 2.5 MG tablet [id number] No Take 1 tablet by mouth once a week. [provider] Taking Active   midodrine  (PROAMATINE ) 5 MG tablet [id number] No Take 1 tablet (5 mg total) by mouth 3 (three) times daily.  Patient taking differently: Take 10 mg by mouth 3 (three) times daily.   Ruddy Corral M, PA-C Taking Active   Multiple Vitamin (MULTIVITAMIN WITH MINERALS) TABS tablet [id number] No Take 1 tablet by mouth daily. [provider] Taking Active Self, Pharmacy Records  nystatin (MYCOSTATIN) 100000 UNIT/ML suspension [id number] No Take 5 mLs by mouth 4 (four) times daily. [provider] Taking Active   Ondansetron  HCl (ZOFRAN  PO) [id number] No 4 mg. Take 1 tablet on the tongue and allow to dissolve [provider] Taking Active            Med Note Merrilyn Abler, [facility] K   Mon Jan 04, 2023 12:24 PM) Taking PRN   phenol (CHLORASEPTIC) 1.4 % LIQD [id number] No Use as directed 1 spray in the mouth or throat as needed for throat irritation / pain. [provider] Taking Active   potassium chloride  SA (KLOR-CON  M) 20 MEQ tablet [id number] No Take 3 tablets (60 mEq total) by mouth 2 (two) times daily. Sabharwal, Aditya, DO Taking Active   risperiDONE  (RISPERDAL ) 0.5 MG tablet [id number] No Take 1 tablet (0.5 mg total) by mouth 2 (two) times daily at 8 am and 4 pm. Arturo Late, MD Taking Active   simvastatin  (ZOCOR ) 10 MG tablet [id number] No TAKE 1 TABLET EVERY EVENING Bensimhon, Rheta Celestine, MD Taking Active   spironolactone  (ALDACTONE ) 25 MG tablet [id number] No TAKE 1 TABLET (25 MG TOTAL) BY MOUTH EVERY EVENING. Bensimhon, Rheta Celestine, MD Taking Active   tirzepatide (MOUNJARO) 2.5 MG/0.5ML Pen [id number] No INJECT 2.5 MG INTO THE SKIN ONCE A WEEK. [provider] Taking Active   tirzepatide (MOUNJARO) 5 MG/0.5ML Pen [id number] No INJECT 5 MG INTO THE SKIN ONCE A WEEK. [provider] Taking Active   tirzepatide [facility]) 7.5 MG/0.5ML Pen [id number] No Inject 7.5 mg into the skin once a week. Bensimhon, Rheta Celestine, MD Taking Active   torsemide  (DEMADEX ) 100 MG tablet [id number] No Take 1 tablet (100 mg total) by mouth 2 (two) times daily. [place], Arlice Bene, FNP Taking Active   torsemide  (DEMADEX ) 100 MG tablet [id number] No Take 1 tablet by mouth 2 (two) times daily. [provider] Taking Active   torsemide  (DEMADEX ) 20 MG tablet [id number] No TAKE 5 TABLETS (100 MG TOTAL) BY MOUTH 2 (TWO) TIMES DAILY. [provider] Taking Active  Recommendation:   Continue utilizing strategies discussed to assist with symptom management  Follow Up Plan:   Telephone follow-up 1-2 weeks  Alease Hunter, LCSW [place]  [facility], [facility] Clinical Social Worker Direct Dial: [phone]  Fax: [phone] Website: [email or web address] 1:53 PM

## 2023-09-24 NOTE — Progress Notes (Signed)
 EPIC Encounter for ICM Monitoring  Patient Name: Rhonda [name] is a 73 y.o. female Date: 09/24/2023 Primary Care Physican: Ransom Byers, MD Primary Cardiologist: Bensimhon Electrophysiologist: Arvid Latino Pacing: 98.9%   06/01/2022 Weight: 193 lbs  07/08/2022 Weight: 183 lbs 08/10/2022 Weight: 185 lbs 11/25/2022 Weight: 180 lbs 12/30/2022 Weight: 184 lbs 04/26/2023 Weight: 214 lbs Per EMT note 07/14/2023 Weight: 226 lbs 09/23/2023 Office Weight: 209 lbs         Transmission results reviewed.               Optivol thoracic impedance trending above baseline.     Prescribed:  Roberts Ching with paramedicine program assists with meds. Torsemide  100 mg 1 tablet(s) (100 mg total) by mouth twice a day  Potassium 20 meq 3 tablets (60 mEq total) by mouth 2 (twice) a day.   Metolazone  2.5mg  1 tablet by mouth once a week. Spironolactone  25 mg take 1 tablet by mouth every evening.   Labs: 07/28/2023 Creatinine 2.06, BUN 52, Potassium 3.5, Sodium 135, eGFR 25 07/14/2023 Creatinine 1.91, BUN 47, Potassium 3.3, Sodium 134, eGFR 28 06/09/2023 Creatinine 1.87, BUN 42, Potassium 3.5, Sodium 136, eGFR 29 05/27/2023 Creatinine 1.87, BUN 42, Potassium 3.5, Sodium 136, eGFR 29 05/13/2023 Creatinine 1.95, BUN 43, Potassium 3.6, Sodium 134, eGFR 27 05/06/2023 Creatinine 2.00, BUN 33, Potassium 4.3, Sodium 140, eGFR 26 03/04/2023 Creatinine 1.62, BUN 40, Potassium 4.4, Sodium 139, eGFR 34 A complete set of results can be found in Results Review.   Recommendations:  None   Follow-up plan: ICM clinic phone appointment on 10/25/2023.   91 day device clinic remote transmission 12/13/2023.     EP/Cardiology Office Visits:  10/13/2023 with HF clinic.  Recall 03/21/2024 with Dr Carolynne Citron.     Copy of ICM check sent to Dr. Carolynne Citron.    3 month ICM trend: 09/20/2023.    12-14 Month ICM trend:     Almyra Jain, RN 09/24/2023 2:57 PM

## 2023-09-27 NOTE — Progress Notes (Signed)
 Subjective:  Patient ID: Rhonda [name], female    DOB: 11-Aug-1950,  MRN: [id number]  73 y.o. female presents with at risk foot care with history of diabetic neuropathy and painful elongated mycotic toenails 1-5 bilaterally which are tender when wearing enclosed shoe gear. Pain is relieved with periodic professional debridement. She is accompanied by her husband on today's visit. Chief Complaint  Patient presents with   Diabetes    "Do her toenails.  She is having some Neuropathy issues."  Dr. Fawn Hooks - 07/21/2023; A1c -     PCP: Ransom Byers, MD.  New problem(s): None.   Review of Systems: Negative except as noted in the HPI.   Allergies  Allergen Reactions   Ceftin Anaphylaxis    Face and throat swell    Cefuroxime Axetil Anaphylaxis    Face and throat swell   Geodon [Ziprasidone Hcl] Hives   Lisinopril  Other (See Comments)    angioedema  lisinopril    Shellfish-Derived Products Other (See Comments)    Other reaction(s): Other  shellfish derived   Cefuroxime     Other reaction(s): anaphylaxis   Sulfacetamide Sodium-Sulfur      Other reaction(s): itch   Allopurinol  Nausea Only and Other (See Comments)    weakness   Ativan  [Lorazepam ] Itching   Diazepam  Other (See Comments)    Patient states that diazepam  doesn't relax, it has the opposite effect.  Valium    Sulfa Antibiotics Itching    Objective:  There were no vitals filed for this visit. Constitutional Patient is a pleasant 73 y.o. female in NAD. AAO x 3.  Vascular Capillary fill time to digits <3 seconds.  DP/PT pulse(s) are faintly palpable b/l lower extremities. Pedal hair absent b/l. Lower extremity skin temperature gradient warm to cool b/l. No pain with calf compression b/l. No cyanosis or clubbing noted. No ischemia nor gangrene noted b/l. Trace edema noted BLE.  Neurologic Pt has subjective symptoms of neuropathy. Protective sensation diminished with 10g monofilament b/l.  Dermatologic Pedal  skin is thin, shiny and atrophic b/l.  No open wounds b/l lower extremities. No interdigital macerations b/l lower extremities. Toenails 1-5 b/l elongated, discolored, dystrophic, thickened, crumbly with subungual debris and tenderness to dorsal palpation. No corns, calluses nor porokeratotic lesions noted.  Orthopedic: Normal muscle strength 5/5 to all lower extremity muscle groups bilaterally. Muscle strength 5/5 to all lower extremity muscle groups bilaterally. Pes planus deformity noted bilateral LE.   Last HgA1c:      No data to display           Assessment:   1. Pain due to onychomycosis of toenails of both feet   2. Diabetic peripheral neuropathy associated with type 2 diabetes mellitus (HCC)    Plan:  Consent given for treatment. Patient examined. All patient's and/or POA's questions/concerns addressed on today's visit. Mycotic toenails 1-5 debrided in length and girth without incident. Continue foot and shoe inspections daily. Monitor blood glucose per PCP/Endocrinologist's recommendations.Continue soft, supportive shoe gear daily. Report any pedal injuries to medical professional. Call office if there are any quesitons/concerns. -Patient/POA to call should there be question/concern in the interim.  Return in about 3 months (around 12/22/2023).  Luella Sager, DPM      [place] LOCATION: [address]  [place], [place] [address]                   Office [phone]   [facility] LOCATION: [address] [place], [place] [address] Office [phone]

## 2023-09-29 DIAGNOSIS — Z6841 Body Mass Index (BMI) 40.0 and over, adult: Secondary | ICD-10-CM | POA: Diagnosis not present

## 2023-09-29 DIAGNOSIS — F32A Depression, unspecified: Secondary | ICD-10-CM | POA: Diagnosis not present

## 2023-09-29 DIAGNOSIS — R519 Headache, unspecified: Secondary | ICD-10-CM | POA: Diagnosis not present

## 2023-09-29 DIAGNOSIS — F0390 Unspecified dementia without behavioral disturbance: Secondary | ICD-10-CM | POA: Diagnosis not present

## 2023-09-29 DIAGNOSIS — E1140 Type 2 diabetes mellitus with diabetic neuropathy, unspecified: Secondary | ICD-10-CM | POA: Diagnosis not present

## 2023-09-29 DIAGNOSIS — I5032 Chronic diastolic (congestive) heart failure: Secondary | ICD-10-CM | POA: Diagnosis not present

## 2023-09-30 ENCOUNTER — Other Ambulatory Visit (HOSPITAL_COMMUNITY): Payer: Self-pay

## 2023-09-30 NOTE — Progress Notes (Signed)
 Paramedicine Encounter    Patient ID: Rhonda [name], female    DOB: 02-23-1951, 73 y.o.   MRN: [id number]   Complaints- headache behind right eye   Assessment- CAOX4, warm and dry just waking up, reporting headache, no shortness of breath, no chest pain, no dizziness.   Compliance with meds- missed two noon doses   Pill box filled- for one week   Refills needed- monjauro at [facility]   Meds changes since last visit- none     Social changes- none    VISIT SUMMARY- Arrived for home visit for Rhonda [name] who reports to be feeling okay but having a headache behind her right eye. She has not been eating much with taking the mounjaro and has not been having much an appetite. I encouraged her to eat small meals and healthy snacks through the day to help with this. I obtained vitals and assessment as noted. No lower legs swelling noted. Lungs clear. Vitals within normal limits. I reviewed meds and confirmed same filling pill box for one week. No refills needed at present. I encouraged her to improve on her exercising and movement. She agreed saying she will try to improve this. I reviewed upcoming appointments. Home visit complete. I will see Rhonda [name] in one week.   BP (!) 104/58   Pulse 100   Resp 16   Wt 205 lb 14.4 oz (93.4 kg)   SpO2 96%   BMI 37.66 kg/m  Weight yesterday-- 209lbs  Last visit weight-- 209lbs      ACTION: Home visit completed     Patient Care Team: Ransom Byers, MD as PCP - General (Family Medicine) Tammie Fall, MD as PCP - Electrophysiology (Cardiology) Denton Flakes, LCSW as Social Worker (Licensed Clinical Social Worker) Lydia Sams, Michele Ahle, DO as Consulting Physician (Neurology) Roberts Ching, Paramedic as Paramedic Donia Furlough, Ancel Kass, MD as Attending Physician (Family Medicine) Bensimhon, Rheta Celestine, MD as Consulting Physician (Cardiology) Carlos Chesterfield, Bronson Canny, MD as Consulting Physician (Psychiatry) Velma Ghazi, DPM as Consulting Physician  (Podiatry) Adriana Albany, LCSW as VBCI Care Management (Licensed Clinical Social Worker) Adriana Albany, LCSW (Licensed Clinical Social Worker) Arlyce Berger, RN as VBCI Care Management Thomasena Fleming, NP as Nurse Practitioner (Cardiology) Luella Sager, DPM as Consulting Physician (Podiatry)  Patient Active Problem List   Diagnosis Date Noted   Mild dementia 04/20/2023   Diabetic neuropathy    Elevated troponin 12/02/2021   Acute left ankle pain 10/11/2021   Hyponatremia 10/08/2021   Decreased estrogen level 08/01/2021   Hyperparathyroidism 08/01/2021   Spondylolisthesis 08/01/2021   Varicose veins of bilateral lower extremities with other complications 08/01/2021   Lumbago without sciatica 04/30/2021   Abnormal gait 06/11/2020   Allergic rhinitis 06/11/2020   Asthma 06/11/2020   Benign intracranial hypertension 06/11/2020   Body mass index (BMI) 45.0-49.9, adult 06/11/2020   Bowel incontinence 06/11/2020   Cholelithiasis 06/11/2020   Chronic sinusitis 06/11/2020   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   Daytime somnolence 06/11/2020   Edema 06/11/2020   Family history of malignant neoplasm of gastrointestinal tract 06/11/2020   Gout 06/11/2020   Insomnia 06/11/2020   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   Lumbar spondylosis with myelopathy 06/11/2020   Macrocytosis 06/11/2020   History of colonic polyps 06/11/2020   Repeated falls 06/11/2020   Irritable bowel syndrome 01/04/2020   Spinal stenosis of lumbar region 01/03/2020   Bilateral leg weakness 08/01/2019   Type II diabetes mellitus 06/15/2019   Hypokalemia 11/08/2018   CKD (  chronic kidney disease) stage 3, GFR 30-59 ml/min 11/08/2018   Orthostatic hypotension 07/28/2017   On home oxygen  therapy    Migraines    Left bundle branch block    Hypothyroidism    Hypertension    Hyperlipidemia    Heart murmur    GERD (gastroesophageal reflux disease)    Exertional shortness of breath    Major depressive disorder     Back pain    Arthritis    Generalized anxiety disorder    Anemia    Chronic respiratory failure 09/14/2013   Biventricular ICD (implantable cardioverter-defibrillator) in place 08/04/2013   Chronic systolic heart failure 10/27/2012   Endometrial polyp 01/20/2012   Malignant tumor of breast 03/26/2011   Vitamin D  deficiency 03/26/2011    Current Outpatient Medications:    acetaminophen  (TYLENOL ) 500 MG tablet, Take 500 mg by mouth every 6 (six) hours as needed., Disp: , Rfl:    acetaZOLAMIDE  (DIAMOX ) 125 MG tablet, Take 1 tablet (125 mg total) by mouth daily., Disp: 90 tablet, Rfl: 3   albuterol  (VENTOLIN  HFA) 108 (90 Base) MCG/ACT inhaler, INHALE TWO PUFFS BY MOUTH EVERY 4 HOURS AS NEEDED, Disp: , Rfl:    allopurinol  (ZYLOPRIM ) 100 MG tablet, Take 0.5 tablets (50 mg total) by mouth daily., Disp: 45 tablet, Rfl: 1   azelastine  (ASTELIN ) 0.1 % nasal spray, Place 2 sprays into both nostrils 2 (two) times daily. Use in each nostril as directed, Disp: 30 mL, Rfl: 12   buPROPion  (WELLBUTRIN  XL) 150 MG 24 hr tablet, Take 1 tablet (150 mg total) by mouth daily., Disp: 30 tablet, Rfl: 5   carvedilol  (COREG ) 3.125 MG tablet, TAKE 1 TABLET (3.125 MG) BY MOUTH TWICE DAILY WITH MEALS, Disp: 180 tablet, Rfl: 3   citalopram  (CELEXA ) 20 MG tablet, Take 1 tablet (20 mg total) by mouth daily., Disp: 30 tablet, Rfl: 5   clonazePAM  (KLONOPIN ) 0.5 MG tablet, Take 1 tablet (0.5 mg total) by mouth at bedtime., Disp: 30 tablet, Rfl: 5   cyanocobalamin  (VITAMIN B12) 1000 MCG tablet, Take 1,000 mcg by mouth daily., Disp: , Rfl:    desloratadine  (CLARINEX ) 5 MG tablet, Take 1 tablet (5 mg total) by mouth daily., Disp: 90 tablet, Rfl: 3   dicyclomine  (BENTYL ) 20 MG tablet, Take 20 mg by mouth 3 (three) times daily before meals., Disp: , Rfl:    famotidine (PEPCID) 20 MG tablet, Take 20 mg by mouth 2 (two) times daily., Disp: , Rfl:    fluticasone  (FLONASE ) 50 MCG/ACT nasal spray, Place 1 spray into both nostrils  daily., Disp: , Rfl:    gabapentin  (NEURONTIN ) 100 MG capsule, TAKE 1 CAPSULE (100 MG TOTAL) BY MOUTH 2 (TWO) TIMES DAILY., Disp: 180 capsule, Rfl: 0   levothyroxine  (SYNTHROID ) 88 MCG tablet, TAKE 1 TABLET(88 MCG) BY MOUTH DAILY BEFORE BREAKFAST, Disp: 90 tablet, Rfl: 3   memantine  (NAMENDA ) 10 MG tablet, Take half tablet twice daily x 1 week, then increase to half tablet in the morning and 1 tablet at bedtime x 1 week, then 1 tablet BID, Disp: 60 tablet, Rfl: 11   memantine  (NAMENDA ) 5 MG tablet, Take 1 tablet by mouth daily., Disp: , Rfl:    metolazone  (ZAROXOLYN ) 2.5 MG tablet, TAKE 1 TABLET BY MOUTH ONCE A WEEK, Disp: 4 tablet, Rfl: 1   metolazone  (ZAROXOLYN ) 2.5 MG tablet, Take 1 tablet by mouth once a week., Disp: , Rfl:    midodrine  (PROAMATINE ) 5 MG tablet, Take 1 tablet (5 mg total) by mouth  3 (three) times daily. (Patient taking differently: Take 10 mg by mouth 3 (three) times daily.), Disp: 200 tablet, Rfl: 3   Multiple Vitamin (MULTIVITAMIN WITH MINERALS) TABS tablet, Take 1 tablet by mouth daily., Disp: , Rfl:    nystatin (MYCOSTATIN) 100000 UNIT/ML suspension, Take 5 mLs by mouth 4 (four) times daily., Disp: , Rfl:    Ondansetron  HCl (ZOFRAN  PO), 4 mg. Take 1 tablet on the tongue and allow to dissolve, Disp: , Rfl:    phenol (CHLORASEPTIC) 1.4 % LIQD, Use as directed 1 spray in the mouth or throat as needed for throat irritation / pain., Disp: , Rfl:    potassium chloride  SA (KLOR-CON  M) 20 MEQ tablet, Take 3 tablets (60 mEq total) by mouth 2 (two) times daily., Disp: 572 tablet, Rfl: 3   risperiDONE  (RISPERDAL ) 0.5 MG tablet, Take 1 tablet (0.5 mg total) by mouth 2 (two) times daily at 8 am and 4 pm., Disp: 60 tablet, Rfl: 5   simvastatin  (ZOCOR ) 10 MG tablet, TAKE 1 TABLET EVERY EVENING, Disp: 90 tablet, Rfl: 3   spironolactone  (ALDACTONE ) 25 MG tablet, TAKE 1 TABLET (25 MG TOTAL) BY MOUTH EVERY EVENING., Disp: 90 tablet, Rfl: 3   tirzepatide (MOUNJARO) 7.5 MG/0.5ML Pen, Inject 7.5  mg into the skin once a week., Disp: 2 mL, Rfl: 0   torsemide  (DEMADEX ) 100 MG tablet, Take 1 tablet (100 mg total) by mouth 2 (two) times daily., Disp: 180 tablet, Rfl: 3   amoxicillin  (AMOXIL ) 875 MG tablet, TAKE 1 TABLET BY MOUTH TWICE A DAY 7 DAYS (Patient not taking: Reported on 09/30/2023), Disp: , Rfl:    tirzepatide (MOUNJARO) 2.5 MG/0.5ML Pen, INJECT 2.5 MG INTO THE SKIN ONCE A WEEK. (Patient not taking: Reported on 09/30/2023), Disp: , Rfl:    tirzepatide (MOUNJARO) 5 MG/0.5ML Pen, INJECT 5 MG INTO THE SKIN ONCE A WEEK. (Patient not taking: Reported on 09/30/2023), Disp: , Rfl:    torsemide  (DEMADEX ) 100 MG tablet, Take 1 tablet by mouth 2 (two) times daily. (Patient not taking: Reported on 09/30/2023), Disp: , Rfl:    torsemide  (DEMADEX ) 20 MG tablet, TAKE 5 TABLETS (100 MG TOTAL) BY MOUTH 2 (TWO) TIMES DAILY. (Patient not taking: Reported on 09/30/2023), Disp: , Rfl:  Allergies  Allergen Reactions   Ceftin Anaphylaxis    Face and throat swell    Cefuroxime Axetil Anaphylaxis    Face and throat swell   Geodon [Ziprasidone Hcl] Hives   Lisinopril  Other (See Comments)    angioedema  lisinopril    Shellfish-Derived Products Other (See Comments)    Other reaction(s): Other  shellfish derived   Cefuroxime     Other reaction(s): anaphylaxis   Sulfacetamide Sodium-Sulfur      Other reaction(s): itch   Allopurinol  Nausea Only and Other (See Comments)    weakness   Ativan  [Lorazepam ] Itching   Diazepam  Other (See Comments)    Patient states that diazepam  doesn't relax, it has the opposite effect.  Valium    Sulfa Antibiotics Itching     Social History   Socioeconomic History   Marital status: Married    Spouse name: Gaylin Ke   Number of children: 2   Years of education: 18   Highest education level: Master's degree (e.g., MA, MS, MEng, MEd, MSW, MBA)  Occupational History   Occupation: retired  Tobacco Use   Smoking status: Every Day    Current packs/day: 0.00    Average  packs/day: 0.5 packs/day for 26.0 years (13.0 ttl pk-yrs)  Types: Cigarettes    Start date: 03/24/1995    Last attempt to quit: 03/23/2021    Years since quitting: 2.5   Smokeless tobacco: Never  Vaping Use   Vaping status: Never Used  Substance and Sexual Activity   Alcohol use: No   Drug use: No   Sexual activity: Yes  Other Topics Concern   Not on file  Social History Narrative   Tobacco Use Cigarettes: Former Smoker, Quit in 2008   No Alcohol   No recreational drug use   Diet: Regular/Low Carb   Exercise: None   Occupation: disabled   Education: Company secretary, masters   Children: 2   Firearms: No   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   Two story home   Lives with spouse Gaylin Ke      Social Drivers of Health   Financial Resource Strain: Low Risk  (09/17/2023)   Overall Financial Resource Strain (CARDIA)    Difficulty of Paying Living Expenses: Not very hard  Food Insecurity: No Food Insecurity (09/17/2023)   Hunger Vital Sign    Worried About Running Out of Food in the Last Year: Never true    Ran Out of Food in the Last Year: Never true  Transportation Needs: No Transportation Needs (09/17/2023)   PRAPARE - Administrator, Civil Service (Medical): No    Lack of Transportation (Non-Medical): No  Physical Activity: Insufficiently Active (09/17/2023)   Exercise Vital Sign    Days of Exercise per Week: 1 day    Minutes of Exercise per Session: 20 min  Stress: Stress Concern Present (09/17/2023)   [name] of Occupational Health - Occupational Stress Questionnaire    Feeling of Stress : To some extent  Social Connections: Socially Integrated (09/17/2023)   Social Connection and Isolation Panel [NHANES]    Frequency of Communication with Friends and Family: Twice a week    Frequency of Social Gatherings with Friends and Family: Twice a week    Attends Religious Services: 1 to 4 times per year    Active Member of Clubs or  Organizations: Patient unable to answer    Attends Banker Meetings: 1 to 4 times per year    Marital Status: Married  Catering manager Violence: Not At Risk (09/17/2023)   Humiliation, Afraid, Rape, and Kick questionnaire    Fear of Current or Ex-Partner: No    Emotionally Abused: No    Physically Abused: No    Sexually Abused: No    Physical Exam      Future Appointments  Date Time Provider Department Center  10/13/2023  1:30 PM [facility] PA/NP [facility] None  10/14/2023  1:00 PM Alease Hunter D, LCSW [facility] None  10/25/2023  7:25 AM [facility] DEVICE REMOTES [facility]  12/13/2023  7:20 AM [facility] DEVICE REMOTES [facility]  12/29/2023  3:00 PM Luella Sager, DPM [facility]  01/11/2024  2:30 PM Reyna Cava K, DO [facility] None  03/09/2024  3:20 PM Arfeen, Bronson Canny, MD [facility] None  03/13/2024  7:10 AM [facility] DEVICE REMOTES [facility]  06/12/2024  7:10 AM [facility] DEVICE REMOTES [facility]  09/11/2024  7:10 AM [facility] DEVICE REMOTES [facility]  12/11/2024  7:10 AM [facility] DEVICE REMOTES [facility]  03/12/2025  7:10 AM [facility] DEVICE REMOTES [facility]

## 2023-10-02 ENCOUNTER — Other Ambulatory Visit (HOSPITAL_COMMUNITY): Payer: Self-pay | Admitting: Internal Medicine

## 2023-10-05 NOTE — Telephone Encounter (Signed)
 Left VM for pt to call about dose

## 2023-10-05 NOTE — Patient Instructions (Addendum)
 Visit Information  Thank you for taking time to visit with me today. Please don't hesitate to contact me if I can be of assistance to you before our next scheduled appointment.  Your next care management appointment is by telephone on 10/29/23 at 0930 with Jurline Olmsted the new [facility] RN CCM [id number]   Please attempt to complete your medicaid application at the DSS office, on line (computer, smart phone, a computer at the [employer] (free) so it can be used to help determined if extra support can be placed in the home.   Please call the care guide team at [phone] if you need to cancel, schedule, or reschedule an appointment.   Please call the Suicide and Crisis Lifeline: 988 call the [place]  National Suicide Prevention Lifeline: 820 126 4690 or TTY: 346-355-5037 TTY 403-846-6779) to talk to a trained counselor call 1-800-273-TALK (toll free, 24 hour hotline) go to [facility] [address], [place] [phone]) call 911 if you are experiencing a Mental Health or Behavioral Health Crisis or need someone to talk to.  Rhonda [name] L. Mcarthur Speedy, RN, BSN, CCM [place]  [facility], [facility] RN Care Manager Direct Dial: [phone]  Fax: [phone]

## 2023-10-06 ENCOUNTER — Other Ambulatory Visit (HOSPITAL_COMMUNITY): Payer: Self-pay | Admitting: Family Medicine

## 2023-10-06 ENCOUNTER — Other Ambulatory Visit (HOSPITAL_COMMUNITY): Payer: Self-pay

## 2023-10-06 ENCOUNTER — Other Ambulatory Visit: Payer: Self-pay | Admitting: Neurology

## 2023-10-06 NOTE — Progress Notes (Signed)
 Paramedicine Encounter    Patient ID: Rhonda [name], female    DOB: Nov 19, 1950, 73 y.o.   MRN: [id number]   Complaints-headache   Assessment- CAOX4, warm and dry laying in bed, reporting to be feeling okay but having an ongoing headache. Her husband reports her sleeping often and continuing to not be motivated to get up and move around. Lungs clear, vitals within normal limits. No lower leg edema.   Compliance with meds- missed two noon doses   Pill box filled- for one week   Refills needed-  -gabapentin  -citalopram  -allopurinol    Meds changes since last visit- none     Social changes- none    VISIT SUMMARY- Arrived for home visit for Estalene who reports to be feeling okay other than a headache that has been ongoing as well as fatigue. I obtained vitals and assessment. Lungs clear, no lower leg edema. Weight is up 2 lbs this week but she denied any shortness of breath, chest pain or dizziness. WE discussed the importance of her getting up and moving around and trying to exercise. She verbalized understanding. I reviewed meds and filled pill box for one week. I plan to see her in the clinic in one week. Home visit complete.   BP 104/60   Pulse 74   Resp 16   Wt 207 lb (93.9 kg)   SpO2 94%   BMI 37.86 kg/m  Weight yesterday-- didn't weigh  Last visit weight-- 205lbs      ACTION: Home visit completed     Patient Care Team: Ransom Byers, MD as PCP - General (Family Medicine) Tammie Fall, MD as PCP - Electrophysiology (Cardiology) Denton Flakes, LCSW as Social Worker (Licensed Clinical Social Worker) Lydia Sams, Michele Ahle, DO as Consulting Physician (Neurology) Roberts Ching, Paramedic as Paramedic Donia Furlough, Ancel Kass, MD as Attending Physician (Family Medicine) Bensimhon, Rheta Celestine, MD as Consulting Physician (Cardiology) Carlos Chesterfield, Bronson Canny, MD as Consulting Physician (Psychiatry) Velma Ghazi, DPM as Consulting Physician (Podiatry) Adriana Albany, LCSW  as [facility] Care Management (Licensed Clinical Social Worker) Adriana Albany, LCSW (Licensed Clinical Social Worker) Thomasena Fleming, NP as Nurse Practitioner (Cardiology) Luella Sager, DPM as Consulting Physician (Podiatry)  Patient Active Problem List   Diagnosis Date Noted   Mild dementia 04/20/2023   Diabetic neuropathy    Elevated troponin 12/02/2021   Acute left ankle pain 10/11/2021   Hyponatremia 10/08/2021   Decreased estrogen level 08/01/2021   Hyperparathyroidism 08/01/2021   Spondylolisthesis 08/01/2021   Varicose veins of bilateral lower extremities with other complications 08/01/2021   Lumbago without sciatica 04/30/2021   Abnormal gait 06/11/2020   Allergic rhinitis 06/11/2020   Asthma 06/11/2020   Benign intracranial hypertension 06/11/2020   Body mass index (BMI) 45.0-49.9, adult 06/11/2020   Bowel incontinence 06/11/2020   Cholelithiasis 06/11/2020   Chronic sinusitis 06/11/2020   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   Daytime somnolence 06/11/2020   Edema 06/11/2020   Family history of malignant neoplasm of gastrointestinal tract 06/11/2020   Gout 06/11/2020   Insomnia 06/11/2020   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   Lumbar spondylosis with myelopathy 06/11/2020   Macrocytosis 06/11/2020   History of colonic polyps 06/11/2020   Repeated falls 06/11/2020   Irritable bowel syndrome 01/04/2020   Spinal stenosis of lumbar region 01/03/2020   Bilateral leg weakness 08/01/2019   Type II diabetes mellitus 06/15/2019   Hypokalemia 11/08/2018   CKD (chronic kidney disease) stage 3, GFR 30-59 ml/min 11/08/2018   Orthostatic hypotension 07/28/2017  On home oxygen  therapy    Migraines    Left bundle branch block    Hypothyroidism    Hypertension    Hyperlipidemia    Heart murmur    GERD (gastroesophageal reflux disease)    Exertional shortness of breath    Major depressive disorder    Back pain    Arthritis    Generalized anxiety disorder    Anemia    Chronic  respiratory failure 09/14/2013   Biventricular ICD (implantable cardioverter-defibrillator) in place 08/04/2013   Chronic systolic heart failure 10/27/2012   Endometrial polyp 01/20/2012   Malignant tumor of breast 03/26/2011   Vitamin D  deficiency 03/26/2011    Current Outpatient Medications:    acetaminophen  (TYLENOL ) 500 MG tablet, Take 500 mg by mouth every 6 (six) hours as needed., Disp: , Rfl:    acetaZOLAMIDE  (DIAMOX ) 125 MG tablet, Take 1 tablet (125 mg total) by mouth daily., Disp: 90 tablet, Rfl: 3   albuterol  (VENTOLIN  HFA) 108 (90 Base) MCG/ACT inhaler, INHALE TWO PUFFS BY MOUTH EVERY 4 HOURS AS NEEDED, Disp: , Rfl:    allopurinol  (ZYLOPRIM ) 100 MG tablet, Take 0.5 tablets (50 mg total) by mouth daily., Disp: 45 tablet, Rfl: 1   amoxicillin  (AMOXIL ) 875 MG tablet, TAKE 1 TABLET BY MOUTH TWICE A DAY 7 DAYS (Patient not taking: Reported on 10/06/2023), Disp: , Rfl:    azelastine  (ASTELIN ) 0.1 % nasal spray, Place 2 sprays into both nostrils 2 (two) times daily. Use in each nostril as directed, Disp: 30 mL, Rfl: 12   buPROPion  (WELLBUTRIN  XL) 150 MG 24 hr tablet, Take 1 tablet (150 mg total) by mouth daily., Disp: 30 tablet, Rfl: 5   carvedilol  (COREG ) 3.125 MG tablet, TAKE 1 TABLET (3.125 MG) BY MOUTH TWICE DAILY WITH MEALS, Disp: 180 tablet, Rfl: 3   citalopram  (CELEXA ) 20 MG tablet, Take 1 tablet (20 mg total) by mouth daily., Disp: 30 tablet, Rfl: 5   clonazePAM  (KLONOPIN ) 0.5 MG tablet, Take 1 tablet (0.5 mg total) by mouth at bedtime., Disp: 30 tablet, Rfl: 5   cyanocobalamin  (VITAMIN B12) 1000 MCG tablet, Take 1,000 mcg by mouth daily., Disp: , Rfl:    desloratadine  (CLARINEX ) 5 MG tablet, Take 1 tablet (5 mg total) by mouth daily., Disp: 90 tablet, Rfl: 3   dicyclomine  (BENTYL ) 20 MG tablet, Take 20 mg by mouth 3 (three) times daily before meals., Disp: , Rfl:    famotidine (PEPCID) 20 MG tablet, Take 20 mg by mouth 2 (two) times daily., Disp: , Rfl:    fluticasone  (FLONASE ) 50  MCG/ACT nasal spray, Place 1 spray into both nostrils daily., Disp: , Rfl:    gabapentin  (NEURONTIN ) 100 MG capsule, TAKE 1 CAPSULE (100 MG TOTAL) BY MOUTH 2 (TWO) TIMES DAILY., Disp: 180 capsule, Rfl: 0   levothyroxine  (SYNTHROID ) 88 MCG tablet, TAKE 1 TABLET(88 MCG) BY MOUTH DAILY BEFORE BREAKFAST, Disp: 90 tablet, Rfl: 3   memantine  (NAMENDA ) 10 MG tablet, Take half tablet twice daily x 1 week, then increase to half tablet in the morning and 1 tablet at bedtime x 1 week, then 1 tablet BID, Disp: 60 tablet, Rfl: 11   memantine  (NAMENDA ) 5 MG tablet, Take 1 tablet by mouth daily., Disp: , Rfl:    metolazone  (ZAROXOLYN ) 2.5 MG tablet, TAKE 1 TABLET BY MOUTH ONCE A WEEK, Disp: 4 tablet, Rfl: 1   metolazone  (ZAROXOLYN ) 2.5 MG tablet, Take 1 tablet by mouth once a week., Disp: , Rfl:    midodrine  (PROAMATINE )  5 MG tablet, Take 1 tablet (5 mg total) by mouth 3 (three) times daily. (Patient taking differently: Take 10 mg by mouth 3 (three) times daily.), Disp: 200 tablet, Rfl: 3   Multiple Vitamin (MULTIVITAMIN WITH MINERALS) TABS tablet, Take 1 tablet by mouth daily., Disp: , Rfl:    nystatin (MYCOSTATIN) 100000 UNIT/ML suspension, Take 5 mLs by mouth 4 (four) times daily., Disp: , Rfl:    Ondansetron  HCl (ZOFRAN  PO), 4 mg. Take 1 tablet on the tongue and allow to dissolve, Disp: , Rfl:    phenol (CHLORASEPTIC) 1.4 % LIQD, Use as directed 1 spray in the mouth or throat as needed for throat irritation / pain., Disp: , Rfl:    potassium chloride  SA (KLOR-CON  M) 20 MEQ tablet, Take 3 tablets (60 mEq total) by mouth 2 (two) times daily., Disp: 572 tablet, Rfl: 3   risperiDONE  (RISPERDAL ) 0.5 MG tablet, Take 1 tablet (0.5 mg total) by mouth 2 (two) times daily at 8 am and 4 pm., Disp: 60 tablet, Rfl: 5   simvastatin  (ZOCOR ) 10 MG tablet, TAKE 1 TABLET EVERY EVENING, Disp: 90 tablet, Rfl: 3   spironolactone  (ALDACTONE ) 25 MG tablet, TAKE 1 TABLET (25 MG TOTAL) BY MOUTH EVERY EVENING., Disp: 90 tablet, Rfl: 3    tirzepatide (MOUNJARO) 2.5 MG/0.5ML Pen, , Disp: , Rfl:    tirzepatide (MOUNJARO) 5 MG/0.5ML Pen, , Disp: , Rfl:    tirzepatide (MOUNJARO) 7.5 MG/0.5ML Pen, Inject 7.5 mg into the skin once a week., Disp: 2 mL, Rfl: 0   torsemide  (DEMADEX ) 100 MG tablet, Take 1 tablet (100 mg total) by mouth 2 (two) times daily., Disp: 180 tablet, Rfl: 3   torsemide  (DEMADEX ) 100 MG tablet, Take 1 tablet by mouth 2 (two) times daily. (Patient not taking: Reported on 10/06/2023), Disp: , Rfl:    torsemide  (DEMADEX ) 20 MG tablet, TAKE 5 TABLETS (100 MG TOTAL) BY MOUTH 2 (TWO) TIMES DAILY. (Patient not taking: Reported on 10/06/2023), Disp: , Rfl:  Allergies  Allergen Reactions   Ceftin Anaphylaxis    Face and throat swell    Cefuroxime Axetil Anaphylaxis    Face and throat swell   Geodon [Ziprasidone Hcl] Hives   Lisinopril  Other (See Comments)    angioedema  lisinopril    Shellfish-Derived Products Other (See Comments)    Other reaction(s): Other  shellfish derived   Cefuroxime     Other reaction(s): anaphylaxis   Sulfacetamide Sodium-Sulfur      Other reaction(s): itch   Allopurinol  Nausea Only and Other (See Comments)    weakness   Ativan  [Lorazepam ] Itching   Diazepam  Other (See Comments)    Patient states that diazepam  doesn't relax, it has the opposite effect.  Valium    Sulfa Antibiotics Itching     Social History   Socioeconomic History   Marital status: Married    Spouse name: Gaylin Ke   Number of children: 2   Years of education: 18   Highest education level: Master's degree (e.g., MA, MS, MEng, MEd, MSW, MBA)  Occupational History   Occupation: retired  Tobacco Use   Smoking status: Every Day    Current packs/day: 0.00    Average packs/day: 0.5 packs/day for 26.0 years (13.0 ttl pk-yrs)    Types: Cigarettes    Start date: 03/24/1995    Last attempt to quit: 03/23/2021    Years since quitting: 2.5   Smokeless tobacco: Never  Vaping Use   Vaping status: Never Used  Substance  and Sexual Activity  Alcohol use: No   Drug use: No   Sexual activity: Yes  Other Topics Concern   Not on file  Social History Narrative   Tobacco Use Cigarettes: Former Smoker, Quit in 2008   No Alcohol   No recreational drug use   Diet: Regular/Low Carb   Exercise: None   Occupation: disabled   Education: Company secretary, masters   Children: 2   Firearms: No   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   Two story home   Lives with spouse Gaylin Ke      Social Drivers of Health   Financial Resource Strain: Low Risk  (09/17/2023)   Overall Financial Resource Strain (CARDIA)    Difficulty of Paying Living Expenses: Not very hard  Food Insecurity: No Food Insecurity (09/17/2023)   Hunger Vital Sign    Worried About Running Out of Food in the Last Year: Never true    Ran Out of Food in the Last Year: Never true  Transportation Needs: No Transportation Needs (09/17/2023)   PRAPARE - Administrator, Civil Service (Medical): No    Lack of Transportation (Non-Medical): No  Physical Activity: Insufficiently Active (09/17/2023)   Exercise Vital Sign    Days of Exercise per Week: 1 day    Minutes of Exercise per Session: 20 min  Stress: Stress Concern Present (09/17/2023)   [name] of Occupational Health - Occupational Stress Questionnaire    Feeling of Stress : To some extent  Social Connections: Socially Integrated (09/17/2023)   Social Connection and Isolation Panel [NHANES]    Frequency of Communication with Friends and Family: Twice a week    Frequency of Social Gatherings with Friends and Family: Twice a week    Attends Religious Services: 1 to 4 times per year    Active Member of Clubs or Organizations: Patient unable to answer    Attends Banker Meetings: 1 to 4 times per year    Marital Status: Married  Catering manager Violence: Not At Risk (09/17/2023)   Humiliation, Afraid, Rape, and Kick questionnaire    Fear of Current  or Ex-Partner: No    Emotionally Abused: No    Physically Abused: No    Sexually Abused: No    Physical Exam      Future Appointments  Date Time Provider Department Center  10/13/2023  1:30 PM [facility] PA/NP [facility] None  10/14/2023  1:00 PM Alease Hunter D, LCSW [facility] None  10/25/2023  7:25 AM [facility] DEVICE REMOTES [facility]  10/29/2023  9:30 AM Slusher, Rojean Cleaves, RN [facility] None  12/13/2023  7:20 AM [facility] H[facility] DEVICE REMOTES [facility]  12/29/2023  3:00 PM Luella Sager, DPM [facility]  01/11/2024  2:30 PM Reyna Cava K, DO [facility] None  03/09/2024  3:20 PM Arfeen, Bronson Canny, MD [facility] None  03/13/2024  7:10 AM [facility] DEVICE REMOTES [facility]  06/12/2024  7:10 AM [facility] H[facility] DEVICE REMOTES [facility]  09/11/2024  7:10 AM [facility] DEVICE REMOTES [facility]  12/11/2024  7:10 AM [facility] H[facility] DEVICE REMOTES [facility]  03/12/2025  7:10 AM [facility] HVT DEVICE REMOTES [facility]

## 2023-10-06 NOTE — Telephone Encounter (Signed)
 Spoke with patient's husband per DPR.  Patient would like to stay at 7.5 mg.  Will send Rx

## 2023-10-08 MED ORDER — MOUNJARO 10 MG/0.5ML ~~LOC~~ SOAJ: 10.0000 mg | SUBCUTANEOUS | 0 refills | Status: DC

## 2023-10-08 NOTE — Addendum Note (Signed)
 Addended by: Ariyel Jeangilles K on: 10/08/2023 08:55 AM   Modules accepted: Orders

## 2023-10-12 ENCOUNTER — Telehealth (HOSPITAL_COMMUNITY): Payer: Self-pay

## 2023-10-12 NOTE — Telephone Encounter (Signed)
 Called to confirm/remind patient of their appointment at [facility] on 10/13/23.   Appointment:   [x] Confirmed  [] Left mess   [] No answer/No voice mail  [] VM Full/unable to leave message  [] Phone not in service  Patient reminded to bring all medications and/or complete list.  Confirmed patient has transportation. Gave directions, instructed to utilize valet parking.

## 2023-10-13 ENCOUNTER — Ambulatory Visit (HOSPITAL_COMMUNITY): Payer: Self-pay | Admitting: Family Medicine

## 2023-10-13 ENCOUNTER — Encounter (HOSPITAL_COMMUNITY): Payer: Self-pay

## 2023-10-13 ENCOUNTER — Other Ambulatory Visit (HOSPITAL_COMMUNITY): Payer: Self-pay

## 2023-10-13 ENCOUNTER — Ambulatory Visit (HOSPITAL_COMMUNITY)
Admission: RE | Admit: 2023-10-13 | Discharge: 2023-10-13 | Disposition: A | Discharge status: Home / Self Care | Payer: Medicare HMO | Source: Ambulatory Visit | Attending: Family Medicine | Admitting: Family Medicine

## 2023-10-13 VITALS — BP 122/72 | HR 86 | Ht 62.0 in | Wt 210.8 lb

## 2023-10-13 DIAGNOSIS — Z79899 Other long term (current) drug therapy: Secondary | ICD-10-CM | POA: Diagnosis not present

## 2023-10-13 DIAGNOSIS — Z9181 History of falling: Secondary | ICD-10-CM

## 2023-10-13 DIAGNOSIS — I878 Other specified disorders of veins: Secondary | ICD-10-CM | POA: Diagnosis not present

## 2023-10-13 DIAGNOSIS — Z9981 Dependence on supplemental oxygen: Secondary | ICD-10-CM | POA: Diagnosis not present

## 2023-10-13 DIAGNOSIS — I428 Other cardiomyopathies: Secondary | ICD-10-CM | POA: Diagnosis not present

## 2023-10-13 DIAGNOSIS — I130 Hypertensive heart and chronic kidney disease with heart failure and stage 1 through stage 4 chronic kidney disease, or unspecified chronic kidney disease: Secondary | ICD-10-CM | POA: Insufficient documentation

## 2023-10-13 DIAGNOSIS — Z853 Personal history of malignant neoplasm of breast: Secondary | ICD-10-CM | POA: Insufficient documentation

## 2023-10-13 DIAGNOSIS — I872 Venous insufficiency (chronic) (peripheral): Secondary | ICD-10-CM

## 2023-10-13 DIAGNOSIS — Z923 Personal history of irradiation: Secondary | ICD-10-CM | POA: Insufficient documentation

## 2023-10-13 DIAGNOSIS — Z7985 Long-term (current) use of injectable non-insulin antidiabetic drugs: Secondary | ICD-10-CM | POA: Diagnosis not present

## 2023-10-13 DIAGNOSIS — I10 Essential (primary) hypertension: Secondary | ICD-10-CM

## 2023-10-13 DIAGNOSIS — Z9221 Personal history of antineoplastic chemotherapy: Secondary | ICD-10-CM | POA: Insufficient documentation

## 2023-10-13 DIAGNOSIS — I5022 Chronic systolic (congestive) heart failure: Secondary | ICD-10-CM | POA: Diagnosis not present

## 2023-10-13 DIAGNOSIS — Z6838 Body mass index (BMI) 38.0-38.9, adult: Secondary | ICD-10-CM | POA: Insufficient documentation

## 2023-10-13 DIAGNOSIS — E669 Obesity, unspecified: Secondary | ICD-10-CM

## 2023-10-13 DIAGNOSIS — N183 Chronic kidney disease, stage 3 unspecified: Secondary | ICD-10-CM | POA: Insufficient documentation

## 2023-10-13 DIAGNOSIS — N1832 Chronic kidney disease, stage 3b: Secondary | ICD-10-CM

## 2023-10-13 DIAGNOSIS — I951 Orthostatic hypotension: Secondary | ICD-10-CM | POA: Diagnosis not present

## 2023-10-13 DIAGNOSIS — J9610 Chronic respiratory failure, unspecified whether with hypoxia or hypercapnia: Secondary | ICD-10-CM | POA: Diagnosis not present

## 2023-10-13 LAB — BASIC METABOLIC PANEL WITH GFR
Anion gap: 14 (ref 5–15)
BUN: 41 mg/dL — ABNORMAL HIGH (ref 8–23)
CO2: 24 mmol/L (ref 22–32)
Calcium: 9.5 mg/dL (ref 8.9–10.3)
Chloride: 97 mmol/L — ABNORMAL LOW (ref 98–111)
Creatinine, Ser: 1.91 mg/dL — ABNORMAL HIGH (ref 0.44–1.00)
GFR, Estimated: 28 mL/min — ABNORMAL LOW (ref >60)
Glucose, Bld: 83 mg/dL (ref 70–99)
Potassium: 3.5 mmol/L (ref 3.5–5.1)
Sodium: 135 mmol/L (ref 135–145)

## 2023-10-13 LAB — BRAIN NATRIURETIC PEPTIDE: B Natriuretic Peptide: 37.5 pg/mL (ref 0.0–100.0)

## 2023-10-13 NOTE — Progress Notes (Signed)
 [facility]   Primary Cardiologist: Dr Micael Adas  General Surgeon: Dr Delane Fear  Orthopedic: Dr Bernard Brick  PCP: Ransom Byers, MD HF Cardiologist: Dr. Julane Ny  HPI: Rhonda [name] is a 73 y.o. 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 She has had sleep study with no evidence of sleep apnea in remote past.  She has a Medtronic CRT-D device. Echo in 7/18 showed recovery of EF to 55-60%. PYP 09/02/17 negative for TTR (Grade 0-1, H/CCL 1.2). SPEP with no M-spike.   Has been followed by Reyna Cava in Neurology. Has been on Florinef  and midodrine  for orthostatic hypotension (failed mestinon).   Seen by Dr. Jearldine Mina and SCr slightly worse, so Florinef  stopped. Echo 1/23 EF 60-65%, RV ok  Follow up 2/23, SCr trending up and metolazone  changed to every other Tuesday.   Admitted 5/23 with AKI. Resuscitated with IVF, Florinef  and midodrine  restarted. SCr stabilized at 1.6-1.8.  Follow up 7/24, volume mildly up and BP elevated. Midodrine  decreased to 5 bid, repeat echo arranged.  Echo 12/30/22 EF 55-60% G1DD RV normal.   Today she returns for HF follow up with her husband and paramedic Rhonda [name]. Overall feeling fine. Husband says memory is poor, Rhonda [name] says she sleeps a lot during the day. Rhonda [name] says breathing is fine. Denies palpitations, abnormal bleeding, CP, dizziness, edema, or PND/Orthopnea. Appetite ok. Weight going down on Mounjaro. Taking all medications.   Cardiac Studies  - Echo (5/14): EF 35% RV ok  - Echo (11/14): EF 20-25%, LV moderately dilated - Echo (5/15): EF 45-50% - Echo (7/18): EF 55-60%, normal RV size and systolic  function, PASP 34 mmHg - PYP (4/19): negative TTR - Echo (1/21): EF 60-65% grade II DD. RV ok - Echo (1/23): EF 60-65%, RV ok - Echo (8/24): EF 55-60%, G1DD, RV normal  Review of systems complete and found to be negative unless listed in HPI.   Past Medical History:  Diagnosis Date   Abnormal gait 06/11/2020   Acute left ankle pain 10/11/2021   AKI (acute kidney injury) 10/08/2021   Allergic rhinitis 06/11/2020   Anemia    Arthritis    Right knee   Asthma 06/11/2020   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   Back pain    Disk problem   Benign intracranial hypertension 06/11/2020   Bilateral leg weakness 08/01/2019   Biventricular ICD (implantable cardioverter-defibrillator) in place 08/04/2013   Body mass index (BMI) 45.0-49.9, adult 06/11/2020   Bowel incontinence 06/11/2020   Cardiomyopathy    Cholelithiasis 06/11/2020   Chronic respiratory failure 09/14/2013   Chronic sinusitis 06/11/2020   Chronic systolic heart failure 10/27/2012   a) NICM b) ECHO (03/2013) EF 20-25% c) ECHO (09/2013) EF 45-50%, grade I DD   CKD (chronic kidney disease) stage 3, GFR 30-59 ml/min 11/08/2018   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   Complication of anesthesia    History of low blood pressure after surgery; attributed to lying flat   Daytime somnolence 06/11/2020   Decreased estrogen level 08/01/2021   Diabetic neuropathy    Diarrhea of presumed infectious origin 06/11/2020   Edema 06/11/2020   Elevated troponin 12/02/2021   Endometrial polyp 01/20/2012   Exertional shortness of breath  Generalized anxiety disorder    GERD (gastroesophageal reflux disease)    Gout 06/11/2020   Heart murmur    History of colonic polyps 06/11/2020   History of fall 06/11/2020   Hyperlipidemia    Hyperparathyroidism 08/01/2021   Hypertension    Hypokalemia 11/08/2018   Hyponatremia 10/08/2021   Hypothyroidism    Insomnia 06/11/2020   Intractable nausea and vomiting 10/08/2021   Irritable bowel syndrome 01/04/2020    Left bundle branch block    s/p CRT-D (04/2013)   Lumbago without sciatica 04/30/2021   Lumbar spondylosis with myelopathy 06/11/2020   Macrocytosis 06/11/2020   Major depressive disorder    Malignant tumor of breast 03/26/2011   Left; completed chemotherapy and radiation treatments   Migraines    Mild dementia 04/20/2023   On home oxygen  therapy    "2L suppose to be q night" (05/03/2013)   Orthostatic hypotension 07/28/2017   Spinal stenosis of lumbar region 01/03/2020   Spondylolisthesis 08/01/2021   SVD (spontaneous vaginal delivery)    x 2   Type II diabetes mellitus 06/15/2019   Varicose veins of bilateral lower extremities with other complications 08/01/2021   Vitamin D  deficiency 03/26/2011   Current Outpatient Medications  Medication Sig Dispense Refill   acetaminophen  (TYLENOL ) 500 MG tablet Take 500 mg by mouth every 6 (six) hours as needed.     acetaZOLAMIDE  (DIAMOX ) 125 MG tablet Take 1 tablet (125 mg total) by mouth daily. 90 tablet 3   albuterol  (VENTOLIN  HFA) 108 (90 Base) MCG/ACT inhaler INHALE TWO PUFFS BY MOUTH EVERY 4 HOURS AS NEEDED     allopurinol  (ZYLOPRIM ) 100 MG tablet TAKE 1/2 TABLET (50 MG TOTAL) BY MOUTH DAILY. 45 tablet 1   azelastine  (ASTELIN ) 0.1 % nasal spray Place 2 sprays into both nostrils 2 (two) times daily. Use in each nostril as directed 30 mL 12   buPROPion  (WELLBUTRIN  XL) 150 MG 24 hr tablet Take 1 tablet (150 mg total) by mouth daily. 30 tablet 5   carvedilol  (COREG ) 3.125 MG tablet TAKE 1 TABLET (3.125 MG) BY MOUTH TWICE DAILY WITH MEALS 180 tablet 3   citalopram  (CELEXA ) 20 MG tablet Take 1 tablet (20 mg total) by mouth daily. 30 tablet 5   clonazePAM  (KLONOPIN ) 0.5 MG tablet Take 1 tablet (0.5 mg total) by mouth at bedtime. 30 tablet 5   cyanocobalamin  (VITAMIN B12) 1000 MCG tablet Take 1,000 mcg by mouth daily.     desloratadine  (CLARINEX ) 5 MG tablet Take 1 tablet (5 mg total) by mouth daily. 90 tablet 3   dicyclomine  (BENTYL ) 20 MG  tablet Take 20 mg by mouth 3 (three) times daily before meals.     famotidine (PEPCID) 20 MG tablet Take 20 mg by mouth 2 (two) times daily.     fluticasone  (FLONASE ) 50 MCG/ACT nasal spray Place 1 spray into both nostrils daily.     gabapentin  (NEURONTIN ) 100 MG capsule TAKE 1 CAPSULE (100 MG TOTAL) BY MOUTH 2 (TWO) TIMES DAILY. 180 capsule 0   levothyroxine  (SYNTHROID ) 88 MCG tablet TAKE 1 TABLET(88 MCG) BY MOUTH DAILY BEFORE BREAKFAST 90 tablet 3   memantine  (NAMENDA ) 5 MG tablet Take 1 tablet by mouth daily.     metolazone  (ZAROXOLYN ) 2.5 MG tablet TAKE 1 TABLET BY MOUTH ONCE A WEEK 4 tablet 1   midodrine  (PROAMATINE ) 5 MG tablet Take 1 tablet (5 mg total) by mouth 3 (three) times daily. (Patient taking differently: Take 10 mg by mouth 3 (three) times daily.) 200 tablet  3   Multiple Vitamin (MULTIVITAMIN WITH MINERALS) TABS tablet Take 1 tablet by mouth daily.     nystatin (MYCOSTATIN) 100000 UNIT/ML suspension Take 5 mLs by mouth 4 (four) times daily.     Ondansetron  HCl (ZOFRAN  PO) 4 mg. Take 1 tablet on the tongue and allow to dissolve     phenol (CHLORASEPTIC) 1.4 % LIQD Use as directed 1 spray in the mouth or throat as needed for throat irritation / pain.     potassium chloride  SA (KLOR-CON  M) 20 MEQ tablet Take 3 tablets (60 mEq total) by mouth 2 (two) times daily. 572 tablet 3   risperiDONE  (RISPERDAL ) 0.5 MG tablet Take 1 tablet (0.5 mg total) by mouth 2 (two) times daily at 8 am and 4 pm. 60 tablet 5   simvastatin  (ZOCOR ) 10 MG tablet TAKE 1 TABLET EVERY EVENING 90 tablet 3   spironolactone  (ALDACTONE ) 25 MG tablet TAKE 1 TABLET (25 MG TOTAL) BY MOUTH EVERY EVENING. 90 tablet 3   tirzepatide (MOUNJARO) 10 MG/0.5ML Pen Inject 10 mg into the skin once a week. 2 mL 0   torsemide  (DEMADEX ) 100 MG tablet Take 1 tablet (100 mg total) by mouth 2 (two) times daily. 180 tablet 3   torsemide  (DEMADEX ) 20 MG tablet      No current facility-administered medications for this encounter.   BP  122/72   Pulse 86   Ht 5\' 2"  (1.575 m)   Wt 95.6 kg (210 lb 12.8 oz)   SpO2 96%   BMI 38.56 kg/m   Wt Readings from Last 3 Encounters:  10/13/23 95.6 kg (210 lb 12.8 oz)  10/06/23 93.9 kg (207 lb)  09/30/23 93.4 kg (205 lb 14.4 oz)    PHYSICAL EXAM: General:  NAD. No resp difficulty, walked into clinic HEENT: Normal Neck: Supple. No JVD. Cor: Regular rate & rhythm. No rubs, gallops or murmurs. Lungs: Clear Abdomen: Soft, nontender, nondistended.  Extremities: No cyanosis, clubbing, rash, trace LE edema Neuro: Alert & oriented x 3, moves all 4 extremities w/o difficulty. Affect pleasant. + dysarthria  Device interrogation (personally reviewed): OptiVol and thoracic impedence stable, 0.2 hr/day activity, <0.1 hr/day AT/AF, 99.6% VP  ASSESSMENT & PLAN:  1) Chronic systolic HF with recovered EF/nonischemic cardiomyopathy:  - NICM s/p Medtronic CRT-D, ? Viral myocarditis. Cardiomyopathy dates from 46.  - Echo (5/15): EF 45-50% - Echo (2020): EF up to 55-60%.  - PYP (4/19) negative for TTR (Grade 0-1, H/CCL 1.2). SPEP with no M-spike.  - Echo (1/21): EF 60-65% grade II DD. RV ok.  - Echo (1/23): EF 60-65% RV normal.  - Echo 12/30/22: EF 55-60% G1DD RV normal.  - Stable NYHA II, functional status limited by obesity, deconditioning and memory deficit. Volume OK today on exam and OptiVol - Continue torsemide  100 mg bid. - Continue metolazone  2.5 mg/40 KCL every Tuesday - Continue carvedilol  3.125 mg bid. - Continue spironolactone  25 mg daily.  - Continue midodrine  10 mg tid. - No SGT2i with chronic yeast infections. - C/w paramedicine  - Labs today. - update echo next visit  2) Obesity - Body mass index is 38.56 kg/m. - Encouraged PREP program. - Now on GLP1RA, weight coming down.  3) HTN - BP improved  - Continue midorine at current dose for now  4) Orthostatic Hypotension.  - Neurology managing.  - She is on diamox  for increased ICP. - Continue midodrine  as above   - No change  5) Chronic Venous stasis - Encouraged her to  use compression stockings   - No change  6) Falls  - No recent falls. - Follows with Neurology.   7) CKD 3 - Baseline SCr 1.4-1.6 - Follows with Dr. Jearldine Mina in Nephrology - Labs today  Follow up in 4 months with Dr. Julane Ny + echo.  Elmarie Hacking, FNP  1:43 PM

## 2023-10-13 NOTE — Patient Instructions (Addendum)
 Good to see you today!  No Medication changes  Your physician has requested that you have an echocardiogram. Echocardiography is a painless test that uses sound waves to create images of your heart. It provides your doctor with information about the size and shape of your heart and how well your heart's chambers and valves are working. This procedure takes approximately one hour. There are no restrictions for this procedure. Please do NOT wear cologne, perfume, aftershave, or lotions (deodorant is allowed). Please arrive 15 minutes prior to your appointment time.  Please note: We ask at that you not bring children with you during ultrasound (echo/ vascular) testing. Due to room size and safety concerns, children are not allowed in the ultrasound rooms during exams. Our front office staff cannot provide observation of children in our lobby area while testing is being conducted. An adult accompanying a patient to their appointment will only be allowed in the ultrasound room at the discretion of the ultrasound technician under special circumstances. We apologize for any inconvenience.  Labs done today, your results will be available in MyChart, we will contact you for abnormal readings.  Your physician recommends that you schedule a follow-up appointment :4 months with echocardiogram(September) Call office in July to schedule an appointment  If you have any questions or concerns before your next appointment please send us  a message through [place] or call our office at [phone].    TO LEAVE A MESSAGE FOR THE NURSE SELECT OPTION 2, PLEASE LEAVE A MESSAGE INCLUDING: YOUR NAME DATE OF BIRTH CALL BACK NUMBER REASON FOR CALL**this is important as we prioritize the call backs  YOU WILL RECEIVE A CALL BACK THE SAME DAY AS LONG AS YOU CALL BEFORE 4:00 PM At the [facility], you and your health needs are our priority. As part of our continuing mission to provide you with exceptional  heart care, we have created designated Provider Care Teams. These Care Teams include your primary Cardiologist (physician) and Advanced Practice Providers (APPs- Physician Assistants and Nurse Practitioners) who all work together to provide you with the care you need, when you need it.   You may see any of the following providers on your designated Care Team at your next follow up: Dr Jules Oar Dr Peder Bourdon Dr. Alwin Baars Dr. Arta Lark Amy Marijane Shoulders, NP Ruddy Corral, [place] [facility] [place], [place] Dennise Fitz, NP [place] Lee, NP Shawnee Dellen, NP Luster Salters, PharmD Bevely Brush, PharmD   Please be sure to bring in all your medications bottles to every appointment.    Thank you for choosing [facility]

## 2023-10-13 NOTE — Progress Notes (Signed)
 Paramedicine Encounter  Patient ID: Rhonda [name], female, DOB: Sep 12, 1950, 73 y.o.,  MRN: [id number]  Met patient in clinic today with Rhonda Good NP, no med changes made. Labs drawn and stable. I reviewed meds and filled pill box for one week. Refills noted and will be called into [facility]. Visit complete.   Weight @ clinic- 210lbs  Weight @ home- 208lbs   B/P-122/72  P-85  SP02-96%   Med changes-  NONE   Social Changes-  [address]    Roberts Ching, EMT-Paramedic [phone] 10/13/2023

## 2023-10-14 ENCOUNTER — Other Ambulatory Visit: Payer: Self-pay | Admitting: Licensed Clinical Social Worker

## 2023-10-15 NOTE — Patient Instructions (Signed)
 Visit Information  Thank you for taking time to visit with me today. Please don't hesitate to contact me if I can be of assistance to you before our next scheduled appointment.  Closing From: Complex Care Management.  Please call the care guide team at [phone] if you need to cancel, schedule, or reschedule an appointment.   Please call the Suicide and Crisis Lifeline: 988 go to [facility] [address] [place] [phone]) call 911 if you are experiencing a Mental Health or Behavioral Health Crisis or need someone to talk to.  Alease Hunter, LCSW [place]  [facility], [facility] Clinical Social Worker Direct Dial: [phone]  Fax: [phone] Website: [email or web address] 5:21 AM

## 2023-10-15 NOTE — Patient Outreach (Signed)
 Complex Care Management   Visit Note  10/14/2023  Name:  Rhonda [name] MRN: [id number] DOB: [birth year]  Situation: Referral received for Complex Care Management related to Mental/Behavioral Health diagnosis Depression and Dementia I obtained verbal consent from Caregiver.  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   Body mass index (BMI) 45.0-49.9, adult 06/11/2020   Bowel incontinence 06/11/2020   Cardiomyopathy    Cholelithiasis 06/11/2020   Chronic respiratory failure 09/14/2013   Chronic sinusitis 06/11/2020   Chronic systolic heart failure 10/27/2012   a) NICM b) ECHO (03/2013) EF 20-25% c) ECHO (09/2013) EF 45-50%, grade I DD   CKD (chronic kidney disease) stage 3, GFR 30-59 ml/min 11/08/2018   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   Complication of anesthesia    History of low blood pressure after surgery; attributed to lying flat   Daytime somnolence 06/11/2020   Decreased estrogen level 08/01/2021   Diabetic neuropathy    Diarrhea of presumed infectious origin 06/11/2020   Edema 06/11/2020   Elevated troponin 12/02/2021   Endometrial polyp 01/20/2012   Exertional shortness of breath    Generalized anxiety disorder    GERD (gastroesophageal reflux disease)    Gout 06/11/2020   Heart murmur    History of colonic polyps 06/11/2020   History of fall 06/11/2020   Hyperlipidemia    Hyperparathyroidism 08/01/2021   Hypertension    Hypokalemia 11/08/2018   Hyponatremia 10/08/2021   Hypothyroidism    Insomnia  06/11/2020   Intractable nausea and vomiting 10/08/2021   Irritable bowel syndrome 01/04/2020   Left bundle branch block    s/p CRT-D (04/2013)   Lumbago without sciatica 04/30/2021   Lumbar spondylosis with myelopathy 06/11/2020   Macrocytosis 06/11/2020   Major depressive disorder    Malignant tumor of breast 03/26/2011   Left; completed chemotherapy and radiation treatments   Migraines    Mild dementia 04/20/2023   On home oxygen  therapy    "2L suppose to be q night" (05/03/2013)   Orthostatic hypotension 07/28/2017   Spinal stenosis of lumbar region 01/03/2020   Spondylolisthesis 08/01/2021   SVD (spontaneous vaginal delivery)    x 2   Type II diabetes mellitus 06/15/2019   Varicose veins of bilateral lower extremities with other complications 08/01/2021   Vitamin D  deficiency 03/26/2011    Assessment: Patient Reported Symptoms:  Cognitive Cognitive Status: Alert and oriented to person, place, and time, Normal speech and language skills      Neurological Neurological Review of Symptoms: No symptoms reported Neurological Conditions: Dementia  HEENT HEENT Symptoms Reported: No symptoms reported      Cardiovascular Cardiovascular Symptoms Reported: No symptoms reported    Respiratory Respiratory Symptoms Reported: No symptoms reported    Endocrine Patient reports the following symptoms related to hypoglycemia or hyperglycemia : No symptoms reported    Gastrointestinal Gastrointestinal Symptoms Reported: No symptoms reported      Genitourinary Genitourinary Symptoms Reported: No symptoms reported    Integumentary Integumentary Symptoms Reported: No symptoms reported  Musculoskeletal Musculoskelatal Symptoms Reviewed: No symptoms reported        Psychosocial Psychosocial Symptoms Reported: No symptoms reported Additional Psychological Details: Patient's spouse shared he and pt are doing well. Pt completed Cardiology and Psychiatrist appts, will follow up with  both specialists in 4-6 months. Family continues to receive support from DSS aid 2 x weekly. Spouse agreed to f/up with DSS and PCP for additional hour needs. Behavioral Health Conditions: Depression, Dementia Behavioral Management Strategies: Adequate rest, Support system, Coping strategies, Medication therapy Major Change/Loss/Stressor/Fears (CP): Medical condition, self        09/17/2023    4:43 PM  Depression screen PHQ 2/9  Decreased Interest 0  Down, Depressed, Hopeless 1  PHQ - 2 Score 1    There were no vitals filed for this visit.  Medications Reviewed Today     Reviewed by Adriana Albany, LCSW (Social Worker) on 10/15/23 at [phone]  Med List Status: <None>   Medication Order Taking? Sig Documenting Provider Last Dose Status Informant  acetaminophen  (TYLENOL ) 500 MG tablet [id number] No Take 500 mg by mouth every 6 (six) hours as needed. [provider] Taking Active   acetaZOLAMIDE  (DIAMOX ) 125 MG tablet [id number] No Take 1 tablet (125 mg total) by mouth daily. Patel, Donika K, DO Taking Active Self, Pharmacy Records  albuterol  (VENTOLIN  HFA) [phone] Base) MCG/ACT inhaler [id number] No INHALE TWO PUFFS BY MOUTH EVERY 4 HOURS AS NEEDED [provider] Taking Active   allopurinol  (ZYLOPRIM ) 100 MG tablet [id number] No TAKE 1/2 TABLET (50 MG TOTAL) BY MOUTH DAILY. Bensimhon, Rheta Celestine, MD Taking Active   azelastine  (ASTELIN ) 0.1 % nasal spray [id number] No Place 2 sprays into both nostrils 2 (two) times daily. Use in each nostril as directed Artice Last, MD Taking Active   buPROPion  (WELLBUTRIN  XL) 150 MG 24 hr tablet [id number] No Take 1 tablet (150 mg total) by mouth daily. Arfeen, Syed T, MD Taking Active   carvedilol  (COREG ) 3.125 MG tablet [id number] No TAKE 1 TABLET (3.125 MG) BY MOUTH TWICE DAILY WITH MEALS Arleene Belt, PA-C Taking Active   citalopram  (CELEXA ) 20 MG tablet [id number] No Take 1 tablet (20 mg total) by mouth daily. Arfeen, Syed T, MD  Taking Active   clonazePAM  (KLONOPIN ) 0.5 MG tablet [id number] No Take 1 tablet (0.5 mg total) by mouth at bedtime. Arturo Late, MD Taking Active   cyanocobalamin  (VITAMIN B12) 1000 MCG tablet [id number] No Take 1,000 mcg by mouth daily. [provider] Taking Active Self, Pharmacy Records  desloratadine  (CLARINEX ) 5 MG tablet [id number] No Take 1 tablet (5 mg total) by mouth daily. Soldatova, Liuba, MD Taking Active            Med Note Merrilyn Abler, [facility] Feb 15, 2023  2:56 PM)    dicyclomine  (BENTYL ) 20 MG tablet [id number] No Take 20 mg by mouth 3 (three) times daily before meals. [provider] Taking Active Self, Pharmacy Records  famotidine (PEPCID) 20 MG tablet [id number] No Take 20 mg by mouth 2 (two) times daily. [provider] Taking Active            Med Note Gaetano Jordan, JENNA B   Wed Jul 14, 2023 10:08 AM) As needed   fluticasone  (FLONASE ) 50 MCG/ACT nasal spray [id number] No Place 1 spray into both nostrils daily. [provider] Taking Active Self, Pharmacy Records  gabapentin  (NEURONTIN ) 100 MG capsule [id number] No TAKE 1 CAPSULE (100 MG TOTAL) BY MOUTH 2 (  TWO) TIMES DAILY. Patel, Donika K, DO Taking Active   levothyroxine  (SYNTHROID ) 88 MCG tablet [id number] No TAKE 1 TABLET(88 MCG) BY MOUTH DAILY BEFORE Basil Lim, MD Taking Active Self, Pharmacy Records  memantine  (NAMENDA ) 5 MG tablet [id number] No Take 1 tablet by mouth daily. [provider] Taking Active   metolazone  (ZAROXOLYN ) 2.5 MG tablet [id number] No TAKE 1 TABLET BY MOUTH ONCE A WEEK Bensimhon, Rheta Celestine, MD Taking Active   midodrine  (PROAMATINE ) 5 MG tablet [id number] No Take 1 tablet (5 mg total) by mouth 3 (three) times daily.  Patient taking differently: Take 10 mg by mouth 3 (three) times daily.   Ruddy Corral M, PA-C Taking Active   Multiple Vitamin (MULTIVITAMIN WITH MINERALS) TABS tablet [id number] No Take 1 tablet by mouth daily. [provider] Taking Active Self, Pharmacy Records  nystatin (MYCOSTATIN) 100000 UNIT/ML suspension [id number] No Take 5 mLs by mouth 4 (four) times daily. [provider] Taking Active   Ondansetron  HCl (ZOFRAN  PO) [id number] No 4 mg. Take 1 tablet on the tongue and allow to dissolve [provider] Taking Active            Med Note Merrilyn Abler, [facility] K   Mon Jan 04, 2023 12:24 PM) Taking PRN   phenol (CHLORASEPTIC) 1.4 % LIQD [id number] No Use as directed 1 spray in the mouth or throat as needed for throat irritation / pain. [provider] Taking Active   potassium chloride  SA (KLOR-CON  M) 20 MEQ tablet [id number] No Take 3 tablets (60 mEq total) by mouth 2 (two) times daily. Alwin Baars, DO Taking Active   risperiDONE  (RISPERDAL ) 0.5 MG tablet [id number] No Take 1 tablet (0.5 mg total) by mouth 2 (two) times daily at 8 am and 4 pm. Arturo Late, MD Taking Active   simvastatin  (ZOCOR ) 10 MG tablet [id number] No TAKE 1 TABLET EVERY EVENING Bensimhon, Rheta Celestine, MD Taking Active   spironolactone  (ALDACTONE ) 25 MG tablet [id number] No TAKE 1 TABLET (25 MG TOTAL) BY MOUTH EVERY EVENING. Bensimhon, Rheta Celestine, MD Taking Active   tirzepatide [facility]) 10 MG/0.5ML Pen [id number] No Inject 10 mg into the skin once a week. Bensimhon, Rheta Celestine, MD Taking Active   torsemide  (DEMADEX ) 100 MG tablet [id number] No Take 1 tablet (100 mg total) by mouth 2 (two) times daily. [place], Arlice Bene, FNP Taking Active             Recommendation:   PCP Follow-up  Follow Up Plan:   Closing From:  Complex Care Management  Alease Hunter, LCSW [place]  [facility], [facility] Clinical Social Worker Direct Dial: [phone]  Fax: [phone] Website: [email or web address] 5:20 AM

## 2023-10-19 ENCOUNTER — Encounter: Payer: Self-pay | Admitting: Neurology

## 2023-10-20 ENCOUNTER — Other Ambulatory Visit (HOSPITAL_COMMUNITY): Payer: Self-pay

## 2023-10-20 NOTE — Progress Notes (Signed)
 Paramedicine Encounter    Patient ID: Rhonda [name], female    DOB: [birth year], 73 y.o.   MRN: [id number]   Complaints- fatigue   Assessment- CAOX4, warm and dry reporting to be feeling okay with complaints of only fatigue.    Compliance with meds-none   Pill box filled- for one week   Refills needed- midodrine , bentyl , bupropion    Meds changes since last visit- none     Social changes- none    VISIT SUMMARY-Arrived for home visit for [name] who reports to be feeling well with only complaints of fatigue. No lower leg swelling, no chest pain, no dizziness, no shortness of breath. I obtained vitals and assessment as noted. Lungs clear. Meds reviewed and confirmed. Pill box filled for one week. Refills as noted called into [facility]. I plan to see her in one week for home visit. [employer] to come out today for eval. Appointments reviewed and confirmed.   BP 122/60   Pulse 86   Resp 16   Wt 209 lb (94.8 kg)   SpO2 94%   BMI 38.23 kg/m  Weight yesterday-- didn't weigh  Last visit weight-210lbs      ACTION: Home visit completed     Patient Care Team: Ransom Byers, MD as PCP - General (Family Medicine) Tammie Fall, MD as PCP - Electrophysiology (Cardiology) Denton Flakes, LCSW as Social Worker (Licensed Clinical Social Worker) Lydia Sams, Michele Ahle, DO as Consulting Physician (Neurology) Roberts Ching, Paramedic as Paramedic Donia Furlough, Ancel Kass, MD as Attending Physician (Family Medicine) Bensimhon, Rheta Celestine, MD as Consulting Physician (Cardiology) Carlos Chesterfield, Bronson Canny, MD as Consulting Physician (Psychiatry) Velma Ghazi, DPM as Consulting Physician (Podiatry) Thomasena Fleming, NP as Nurse Practitioner (Cardiology) Luella Sager, DPM as Consulting Physician (Podiatry)  Patient Active Problem List   Diagnosis Date Noted   Mild dementia 04/20/2023   Diabetic neuropathy    Elevated troponin 12/02/2021   Acute left ankle pain 10/11/2021    Hyponatremia 10/08/2021   Decreased estrogen level 08/01/2021   Hyperparathyroidism 08/01/2021   Spondylolisthesis 08/01/2021   Varicose veins of bilateral lower extremities with other complications 08/01/2021   Lumbago without sciatica 04/30/2021   Abnormal gait 06/11/2020   Allergic rhinitis 06/11/2020   Asthma 06/11/2020   Benign intracranial hypertension 06/11/2020   Body mass index (BMI) 45.0-49.9, adult 06/11/2020   Bowel incontinence 06/11/2020   Cholelithiasis 06/11/2020   Chronic sinusitis 06/11/2020   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   Daytime somnolence 06/11/2020   Edema 06/11/2020   Family history of malignant neoplasm of gastrointestinal tract 06/11/2020   Gout 06/11/2020   Insomnia 06/11/2020   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   Lumbar spondylosis with myelopathy 06/11/2020   Macrocytosis 06/11/2020   History of colonic polyps 06/11/2020   Repeated falls 06/11/2020   Irritable bowel syndrome 01/04/2020   Spinal stenosis of lumbar region 01/03/2020   Bilateral leg weakness 08/01/2019   Type II diabetes mellitus 06/15/2019   Hypokalemia 11/08/2018   CKD (chronic kidney disease) stage 3, GFR 30-59 ml/min 11/08/2018   Orthostatic hypotension 07/28/2017   On home oxygen  therapy    Migraines    Left bundle branch block    Hypothyroidism    Hypertension    Hyperlipidemia    Heart murmur    GERD (gastroesophageal reflux disease)    Exertional shortness of breath    Major depressive disorder    Back pain    Arthritis    Generalized anxiety disorder  Anemia    Chronic respiratory failure 09/14/2013   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/04/2013   Chronic systolic heart failure 10/27/2012   Endometrial polyp 01/20/2012   Malignant tumor of breast 03/26/2011   Vitamin D  deficiency 03/26/2011    Current Outpatient Medications:    acetaminophen  (TYLENOL ) 500 MG tablet, Take 500 mg by mouth every 6 (six) hours as needed., Disp: , Rfl:     acetaZOLAMIDE  (DIAMOX ) 125 MG tablet, Take 1 tablet (125 mg total) by mouth daily., Disp: 90 tablet, Rfl: 3   albuterol  (VENTOLIN  HFA) 108 (90 Base) MCG/ACT inhaler, INHALE TWO PUFFS BY MOUTH EVERY 4 HOURS AS NEEDED, Disp: , Rfl:    allopurinol  (ZYLOPRIM ) 100 MG tablet, TAKE 1/2 TABLET (50 MG TOTAL) BY MOUTH DAILY., Disp: 45 tablet, Rfl: 1   azelastine  (ASTELIN ) 0.1 % nasal spray, Place 2 sprays into both nostrils 2 (two) times daily. Use in each nostril as directed, Disp: 30 mL, Rfl: 12   buPROPion  (WELLBUTRIN  XL) 150 MG 24 hr tablet, Take 1 tablet (150 mg total) by mouth daily., Disp: 30 tablet, Rfl: 5   carvedilol  (COREG ) 3.125 MG tablet, TAKE 1 TABLET (3.125 MG) BY MOUTH TWICE DAILY WITH MEALS, Disp: 180 tablet, Rfl: 3   citalopram  (CELEXA ) 20 MG tablet, Take 1 tablet (20 mg total) by mouth daily., Disp: 30 tablet, Rfl: 5   clonazePAM  (KLONOPIN ) 0.5 MG tablet, Take 1 tablet (0.5 mg total) by mouth at bedtime., Disp: 30 tablet, Rfl: 5   cyanocobalamin  (VITAMIN B12) 1000 MCG tablet, Take 1,000 mcg by mouth daily., Disp: , Rfl:    desloratadine  (CLARINEX ) 5 MG tablet, Take 1 tablet (5 mg total) by mouth daily., Disp: 90 tablet, Rfl: 3   dicyclomine  (BENTYL ) 20 MG tablet, Take 20 mg by mouth 3 (three) times daily before meals., Disp: , Rfl:    famotidine (PEPCID) 20 MG tablet, Take 20 mg by mouth 2 (two) times daily., Disp: , Rfl:    fluticasone  (FLONASE ) 50 MCG/ACT nasal spray, Place 1 spray into both nostrils daily., Disp: , Rfl:    gabapentin  (NEURONTIN ) 100 MG capsule, TAKE 1 CAPSULE (100 MG TOTAL) BY MOUTH 2 (TWO) TIMES DAILY., Disp: 180 capsule, Rfl: 0   levothyroxine  (SYNTHROID ) 88 MCG tablet, TAKE 1 TABLET(88 MCG) BY MOUTH DAILY BEFORE BREAKFAST, Disp: 90 tablet, Rfl: 3   memantine  (NAMENDA ) 5 MG tablet, Take 1 tablet by mouth daily., Disp: , Rfl:    metolazone  (ZAROXOLYN ) 2.5 MG tablet, TAKE 1 TABLET BY MOUTH ONCE A WEEK, Disp: 4 tablet, Rfl: 1   midodrine  (PROAMATINE ) 5 MG tablet, Take 1  tablet (5 mg total) by mouth 3 (three) times daily. (Patient taking differently: Take 10 mg by mouth 3 (three) times daily.), Disp: 200 tablet, Rfl: 3   Multiple Vitamin (MULTIVITAMIN WITH MINERALS) TABS tablet, Take 1 tablet by mouth daily., Disp: , Rfl:    nystatin (MYCOSTATIN) 100000 UNIT/ML suspension, Take 5 mLs by mouth 4 (four) times daily., Disp: , Rfl:    Ondansetron  HCl (ZOFRAN  PO), 4 mg. Take 1 tablet on the tongue and allow to dissolve, Disp: , Rfl:    phenol (CHLORASEPTIC) 1.4 % LIQD, Use as directed 1 spray in the mouth or throat as needed for throat irritation / pain., Disp: , Rfl:    potassium chloride  SA (KLOR-CON  M) 20 MEQ tablet, Take 3 tablets (60 mEq total) by mouth 2 (two) times daily., Disp: 572 tablet, Rfl: 3   risperiDONE  (RISPERDAL ) 0.5 MG tablet, Take  1 tablet (0.5 mg total) by mouth 2 (two) times daily at 8 am and 4 pm., Disp: 60 tablet, Rfl: 5   simvastatin  (ZOCOR ) 10 MG tablet, TAKE 1 TABLET EVERY EVENING, Disp: 90 tablet, Rfl: 3   spironolactone  (ALDACTONE ) 25 MG tablet, TAKE 1 TABLET (25 MG TOTAL) BY MOUTH EVERY EVENING., Disp: 90 tablet, Rfl: 3   tirzepatide (MOUNJARO) 10 MG/0.5ML Pen, Inject 10 mg into the skin once a week., Disp: 2 mL, Rfl: 0   torsemide  (DEMADEX ) 100 MG tablet, Take 1 tablet (100 mg total) by mouth 2 (two) times daily., Disp: 180 tablet, Rfl: 3 Allergies  Allergen Reactions   Ceftin Anaphylaxis    Face and throat swell    Cefuroxime Axetil Anaphylaxis    Face and throat swell   Geodon [Ziprasidone Hcl] Hives   Lisinopril  Other (See Comments)    angioedema  lisinopril    Shellfish-Derived Products Other (See Comments)    Other reaction(s): Other  shellfish derived   Cefuroxime     Other reaction(s): anaphylaxis   Sulfacetamide Sodium-Sulfur      Other reaction(s): itch   Allopurinol  Nausea Only and Other (See Comments)    weakness   Ativan  [Lorazepam ] Itching   Diazepam  Other (See Comments)    Patient states that diazepam  doesn't  relax, it has the opposite effect.  Valium    Sulfa Antibiotics Itching     Social History   Socioeconomic History   Marital status: Married    Spouse name: Gaylin Ke   Number of children: 2   Years of education: 18   Highest education level: Master's degree (e.g., MA, MS, MEng, MEd, MSW, MBA)  Occupational History   Occupation: retired  Tobacco Use   Smoking status: Every Day    Current packs/day: 0.00    Average packs/day: 0.5 packs/day for 26.0 years (13.0 ttl pk-yrs)    Types: Cigarettes    Start date: 03/24/1995    Last attempt to quit: 03/23/2021    Years since quitting: 2.5   Smokeless tobacco: Never  Vaping Use   Vaping status: Never Used  Substance and Sexual Activity   Alcohol use: No   Drug use: No   Sexual activity: Yes  Other Topics Concern   Not on file  Social History Narrative   Tobacco Use Cigarettes: Former Smoker, Quit in 2008   No Alcohol   No recreational drug use   Diet: Regular/Low Carb   Exercise: None   Occupation: disabled   Education: Company secretary, masters   Children: 2   Firearms: No   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   Two story home   Lives with spouse Gaylin Ke      Social Drivers of Health   Financial Resource Strain: Low Risk  (09/17/2023)   Overall Financial Resource Strain (CARDIA)    Difficulty of Paying Living Expenses: Not very hard  Food Insecurity: No Food Insecurity (09/17/2023)   Hunger Vital Sign    Worried About Running Out of Food in the Last Year: Never true    Ran Out of Food in the Last Year: Never true  Transportation Needs: No Transportation Needs (09/17/2023)   PRAPARE - Administrator, Civil Service (Medical): No    Lack of Transportation (Non-Medical): No  Physical Activity: Insufficiently Active (09/17/2023)   Exercise Vital Sign    Days of Exercise per Week: 1 day    Minutes of Exercise per Session: 20 min  Stress: Stress Concern Present (09/17/2023)   [employer] of Occupational Health - Occupational Stress Questionnaire    Feeling of Stress : To some extent  Social Connections: Socially Integrated (09/17/2023)   Social Connection and Isolation Panel [NHANES]    Frequency of Communication with Friends and Family: Twice a week    Frequency of Social Gatherings with Friends and Family: Twice a week    Attends Religious Services: 1 to 4 times per year    Active Member of Clubs or Organizations: Patient unable to answer    Attends Banker Meetings: 1 to 4 times per year    Marital Status: Married  Catering manager Violence: Not At Risk (09/17/2023)   Humiliation, Afraid, Rape, and Kick questionnaire    Fear of Current or Ex-Partner: No    Emotionally Abused: No    Physically Abused: No    Sexually Abused: No    Physical Exam      Future Appointments  Date Time Provider Department Center  10/25/2023  7:25 AM [facility] DEVICE REMOTES [facility]  10/29/2023  9:30 AM Slusher, Rojean Cleaves, RN [facility] None  12/13/2023  7:20 AM [facility] DEVICE REMOTES [facility]  12/29/2023  3:00 PM Luella Sager, DPM [facility]  01/12/2024 10:30 AM Reyna Cava K, DO [facility] None  03/09/2024  3:20 PM Arfeen, Bronson Canny, MD [facility] None  03/13/2024  7:10 AM [facility] DEVICE REMOTES [facility]  06/12/2024  7:10 AM [facility] DEVICE REMOTES [facility]  09/11/2024  7:10 AM [facility]VT DEVICE REMOTES [facility]  12/11/2024  7:10 AM [facility] DEVICE REMOTES [facility]  03/12/2025  7:10 AM [facility] DEVICE REMOTES [facility]

## 2023-10-21 DIAGNOSIS — N1832 Chronic kidney disease, stage 3b: Secondary | ICD-10-CM | POA: Diagnosis not present

## 2023-10-25 ENCOUNTER — Encounter (HOSPITAL_BASED_OUTPATIENT_CLINIC_OR_DEPARTMENT_OTHER): Payer: Self-pay | Admitting: Emergency Medicine

## 2023-10-25 ENCOUNTER — Other Ambulatory Visit: Payer: Self-pay

## 2023-10-25 ENCOUNTER — Ambulatory Visit: Attending: Internal Medicine

## 2023-10-25 ENCOUNTER — Emergency Department (HOSPITAL_BASED_OUTPATIENT_CLINIC_OR_DEPARTMENT_OTHER)
Admission: EM | Admit: 2023-10-25 | Discharge: 2023-10-25 | Disposition: A | Discharge status: Home / Self Care | Attending: Emergency Medicine | Admitting: Emergency Medicine

## 2023-10-25 DIAGNOSIS — E039 Hypothyroidism, unspecified: Secondary | ICD-10-CM | POA: Insufficient documentation

## 2023-10-25 DIAGNOSIS — I5022 Chronic systolic (congestive) heart failure: Secondary | ICD-10-CM | POA: Insufficient documentation

## 2023-10-25 DIAGNOSIS — I130 Hypertensive heart and chronic kidney disease with heart failure and stage 1 through stage 4 chronic kidney disease, or unspecified chronic kidney disease: Secondary | ICD-10-CM | POA: Insufficient documentation

## 2023-10-25 DIAGNOSIS — N183 Chronic kidney disease, stage 3 unspecified: Secondary | ICD-10-CM | POA: Insufficient documentation

## 2023-10-25 DIAGNOSIS — J45909 Unspecified asthma, uncomplicated: Secondary | ICD-10-CM | POA: Insufficient documentation

## 2023-10-25 DIAGNOSIS — Z95810 Presence of automatic (implantable) cardiac defibrillator: Secondary | ICD-10-CM

## 2023-10-25 DIAGNOSIS — L03115 Cellulitis of right lower limb: Secondary | ICD-10-CM | POA: Diagnosis not present

## 2023-10-25 DIAGNOSIS — E1122 Type 2 diabetes mellitus with diabetic chronic kidney disease: Secondary | ICD-10-CM | POA: Diagnosis not present

## 2023-10-25 DIAGNOSIS — M79661 Pain in right lower leg: Secondary | ICD-10-CM | POA: Diagnosis present

## 2023-10-25 LAB — BASIC METABOLIC PANEL WITH GFR
Anion gap: 14 (ref 5–15)
BUN: 46 mg/dL — ABNORMAL HIGH (ref 8–23)
CO2: 25 mmol/L (ref 22–32)
Calcium: 9.8 mg/dL (ref 8.9–10.3)
Chloride: 96 mmol/L — ABNORMAL LOW (ref 98–111)
Creatinine, Ser: 1.78 mg/dL — ABNORMAL HIGH (ref 0.44–1.00)
GFR, Estimated: 30 mL/min — ABNORMAL LOW (ref >60)
Glucose, Bld: 98 mg/dL (ref 70–99)
Potassium: 3.3 mmol/L — ABNORMAL LOW (ref 3.5–5.1)
Sodium: 136 mmol/L (ref 135–145)

## 2023-10-25 LAB — CBC WITH DIFFERENTIAL/PLATELET
Abs Immature Granulocytes: 0.11 10*3/uL — ABNORMAL HIGH (ref 0.00–0.07)
Basophils Absolute: 0 10*3/uL (ref 0.0–0.1)
Basophils Relative: 0 %
Eosinophils Absolute: 0.3 10*3/uL (ref 0.0–0.5)
Eosinophils Relative: 2 %
HCT: 35.7 % — ABNORMAL LOW (ref 36.0–46.0)
Hemoglobin: 11.9 g/dL — ABNORMAL LOW (ref 12.0–15.0)
Immature Granulocytes: 1 %
Lymphocytes Relative: 9 %
Lymphs Abs: 1 10*3/uL (ref 0.7–4.0)
MCH: 35.5 pg — ABNORMAL HIGH (ref 26.0–34.0)
MCHC: 33.3 g/dL (ref 30.0–36.0)
MCV: 106.6 fL — ABNORMAL HIGH (ref 80.0–100.0)
Monocytes Absolute: 0.8 10*3/uL (ref 0.1–1.0)
Monocytes Relative: 7 %
Neutro Abs: 9.8 10*3/uL — ABNORMAL HIGH (ref 1.7–7.7)
Neutrophils Relative %: 81 %
Platelets: 168 10*3/uL (ref 150–400)
RBC: 3.35 MIL/uL — ABNORMAL LOW (ref 3.87–5.11)
RDW: 13.1 % (ref 11.5–15.5)
WBC: 12 10*3/uL — ABNORMAL HIGH (ref 4.0–10.5)
nRBC: 0 % (ref 0.0–0.2)

## 2023-10-25 MED ORDER — DOXYCYCLINE HYCLATE 100 MG PO TABS
100.0000 mg | ORAL_TABLET | Freq: Once | ORAL | Status: AC
  Administered 2023-10-25: 100 mg via ORAL
  Filled 2023-10-25: qty 1

## 2023-10-25 MED ORDER — DOXYCYCLINE HYCLATE 100 MG PO CAPS: 100.0000 mg | ORAL_CAPSULE | Freq: Two times a day (BID) | ORAL | 0 refills | Status: DC

## 2023-10-25 NOTE — ED Notes (Signed)
 Questions and concerns addressed. Discharge teaching completed.   Prescriptions reviewed and pharmacy verified.   Pt discharged with husband.

## 2023-10-25 NOTE — Discharge Instructions (Signed)
 You were seen today for pain in the right lower leg.  The skin changes are suggestive of a skin infection called cellulitis.  Take antibiotics as Tylenol  as needed for pain.

## 2023-10-25 NOTE — ED Triage Notes (Addendum)
 Pt reports rt foot pain & swelling that began yesterday. Denies injury to area. Redness noted to area in triage

## 2023-10-25 NOTE — ED Provider Notes (Signed)
 [place] EMERGENCY DEPARTMENT AT [facility] Provider Note   CSN: [id number] Arrival date & time: 10/25/23  0303     History  Chief Complaint  Patient presents with   Foot Pain    Rhonda [name] is a 73 y.o. female.  HPI     This is a 73 year old female who presents with right lower extremity pain.  Patient over the last 1 to 2 days has had increasing right lower extremity ankle and foot pain.  She noted some redness.  She has not had any fevers.  No known history of gout or inflammatory arthritis.  She has not taken anything for her pain.  Home Medications Prior to Admission medications   Medication Sig Start Date End Date Taking? Authorizing Provider  doxycycline (VIBRAMYCIN) 100 MG capsule Take 1 capsule (100 mg total) by mouth 2 (two) times daily. 10/25/23  Yes Ralph Brouwer, Vonzella Guernsey, MD  acetaminophen  (TYLENOL ) 500 MG tablet Take 500 mg by mouth every 6 (six) hours as needed.    [provider]  acetaZOLAMIDE  (DIAMOX ) 125 MG tablet Take 1 tablet (125 mg total) by mouth daily. 02/27/21   Patel, Donika K, DO  albuterol  (VENTOLIN  HFA) 108 (90 Base) MCG/ACT inhaler INHALE TWO PUFFS BY MOUTH EVERY 4 HOURS AS NEEDED    [provider]  allopurinol  (ZYLOPRIM ) 100 MG tablet TAKE 1/2 TABLET (50 MG TOTAL) BY MOUTH DAILY. 10/07/23   Bensimhon, Rheta Celestine, MD  azelastine  (ASTELIN ) 0.1 % nasal spray Place 2 sprays into both nostrils 2 (two) times daily. Use in each nostril as directed 02/04/23   Soldatova, Liuba, MD  buPROPion  (WELLBUTRIN  XL) 150 MG 24 hr tablet Take 1 tablet (150 mg total) by mouth daily. 09/09/23   Arfeen, Bronson Canny, MD  carvedilol  (COREG ) 3.125 MG tablet TAKE 1 TABLET (3.125 MG) BY MOUTH TWICE DAILY WITH MEALS 06/15/23   Arleene Belt, PA-C  citalopram  (CELEXA ) 20 MG tablet Take 1 tablet (20 mg total) by mouth daily. 09/09/23   Arfeen, Bronson Canny, MD  clonazePAM  (KLONOPIN ) 0.5 MG tablet Take 1 tablet (0.5 mg total) by mouth at bedtime. 09/09/23   Arfeen,  Bronson Canny, MD  cyanocobalamin  (VITAMIN B12) 1000 MCG tablet Take 1,000 mcg by mouth daily.    [provider]  desloratadine  (CLARINEX ) 5 MG tablet Take 1 tablet (5 mg total) by mouth daily. 02/04/23   Soldatova, Liuba, MD  dicyclomine  (BENTYL ) 20 MG tablet Take 20 mg by mouth 3 (three) times daily before meals.    [provider]  famotidine (PEPCID) 20 MG tablet Take 20 mg by mouth 2 (two) times daily.    [provider]  fluticasone  (FLONASE ) 50 MCG/ACT nasal spray Place 1 spray into both nostrils daily.    [provider]  gabapentin  (NEURONTIN ) 100 MG capsule TAKE 1 CAPSULE (100 MG TOTAL) BY MOUTH 2 (TWO) TIMES DAILY. 10/08/23   Patel, Donika K, DO  levothyroxine  (SYNTHROID ) 88 MCG tablet TAKE 1 TABLET(88 MCG) BY MOUTH DAILY BEFORE BREAKFAST 07/08/20   Emilie Harden, MD  memantine  (NAMENDA ) 5 MG tablet Take 1 tablet by mouth daily.    [provider]  metolazone  (ZAROXOLYN ) 2.5 MG tablet TAKE 1 TABLET BY MOUTH ONCE A WEEK 07/07/23   Bensimhon, Rheta Celestine, MD  midodrine  (PROAMATINE ) 5 MG tablet Take 1 tablet (5 mg total) by mouth 3 (three) times daily. Patient taking differently: Take 10 mg by mouth 3 (three) times daily. 12/02/22   Horace Lye, PA-C  Multiple Vitamin (MULTIVITAMIN WITH MINERALS) TABS tablet Take 1 tablet by mouth daily.    [provider]  nystatin (MYCOSTATIN) 100000 UNIT/ML suspension Take 5 mLs by mouth 4 (four) times daily.    [provider]  Ondansetron  HCl (ZOFRAN  PO) 4 mg. Take 1 tablet on the tongue and allow to dissolve    [provider]  phenol (CHLORASEPTIC) 1.4 % LIQD Use as directed 1 spray in the mouth or throat as needed for throat irritation / pain.    [provider]  potassium chloride  SA (KLOR-CON  M) 20 MEQ tablet Take 3 tablets (60 mEq total) by mouth 2 (two) times daily. 07/14/23   Sabharwal, Aditya, DO  risperiDONE  (RISPERDAL ) 0.5 MG tablet Take 1 tablet (0.5 mg total) by  mouth 2 (two) times daily at 8 am and 4 pm. 09/09/23   Arfeen, Bronson Canny, MD  simvastatin  (ZOCOR ) 10 MG tablet TAKE 1 TABLET EVERY EVENING 06/14/23   Bensimhon, Rheta Celestine, MD  spironolactone  (ALDACTONE ) 25 MG tablet TAKE 1 TABLET (25 MG TOTAL) BY MOUTH EVERY EVENING. 04/28/23   Bensimhon, Rheta Celestine, MD  tirzepatide [facility]) 10 MG/0.5ML Pen Inject 10 mg into the skin once a week. 10/08/23   Bensimhon, Rheta Celestine, MD  torsemide  (DEMADEX ) 100 MG tablet Take 1 tablet (100 mg total) by mouth 2 (two) times daily. 08/04/23   Elmarie Hacking, FNP      Allergies    Ceftin, Cefuroxime axetil, Geodon [ziprasidone hcl], Lisinopril , Shellfish-derived products, Cefuroxime, Sulfacetamide sodium-sulfur , Allopurinol , Ativan  [lorazepam ], Diazepam , and Sulfa antibiotics    Review of Systems   Review of Systems  Musculoskeletal:        Right lower extremity pain  Skin:  Positive for color change.  All other systems reviewed and are negative.   Physical Exam Updated Vital Signs BP 129/70   Pulse 75   Temp 98.5 F (36.9 C) (Oral)   Resp 20   SpO2 93%  Physical Exam Vitals and nursing note reviewed.  Constitutional:      Appearance: She is well-developed. She is obese. She is not ill-appearing.  HENT:     Head: Normocephalic and atraumatic.  Eyes:     Pupils: Pupils are equal, round, and reactive to light.  Cardiovascular:     Rate and Rhythm: Normal rate and regular rhythm.     Heart sounds: Normal heart sounds.  Pulmonary:     Effort: Pulmonary effort is normal. No respiratory distress.     Breath sounds: No wheezing.  Abdominal:     Palpations: Abdomen is soft.  Musculoskeletal:     Cervical back: Neck supple.     Comments: Trace to 1+ bilateral lower extremity edema, symmetric, slight erythema noted over the anterior aspect of the right shin into the ankle, normal range of motion at the ankle, no significant pain with range of motion, 2+ DP pulse  Skin:    General: Skin is warm and dry.   Neurological:     Mental Status: She is alert and oriented to person, place, and time.  Psychiatric:        Mood and Affect: Mood normal.     ED Results / Procedures / Treatments   Labs (all labs ordered are listed, but only abnormal results are displayed) Labs Reviewed  BASIC METABOLIC PANEL WITH GFR - Abnormal; Notable for the following components:      Result Value   Potassium 3.3 (*)    Chloride 96 (*)    BUN  46 (*)    Creatinine, Ser 1.78 (*)    GFR, Estimated 30 (*)    All other components within normal limits  CBC WITH DIFFERENTIAL/PLATELET - Abnormal; Notable for the following components:   WBC 12.0 (*)    RBC 3.35 (*)    Hemoglobin 11.9 (*)    HCT 35.7 (*)    MCV 106.6 (*)    MCH 35.5 (*)    Neutro Abs 9.8 (*)    Abs Immature Granulocytes 0.11 (*)    All other components within normal limits  CBC WITH DIFFERENTIAL/PLATELET    EKG None  Radiology No results found.  Procedures Procedures    Medications Ordered in ED Medications  doxycycline (VIBRA-TABS) tablet 100 mg (100 mg Oral Given 10/25/23 0330)    ED Course/ Medical Decision Making/ A&P                                 Medical Decision Making Amount and/or Complexity of Data Reviewed Labs: ordered.  Risk Prescription drug management.   This patient presents to the ED for concern of leg pain and redness, this involves an extensive number of treatment options, and is a complaint that carries with it a high risk of complications and morbidity.  I considered the following differential and admission for this acute, potentially life threatening condition.  The differential diagnosis includes cellulitis, dependent edema, septic joint  MDM:    This is a 73 year old female who presents with lower extremity pain.  She is overall nontoxic-appearing and afebrile.  She has some erythema to the anterior shin.  She has normal range of motion of the ankle with some swelling but the swelling seems to  encompass both ankle and the foot.  Low suspicion for septic joint.  Looks like mild cellulitis.  She systemically is well-appearing.  She has a slight leukocytosis to 12.0 but is afebrile.  Will start on doxycycline for cellulitis.  Otherwise her labs are at baseline.  (Labs, imaging, consults)  Labs: I Ordered, and personally interpreted labs.  The pertinent results include: CBC, BMP  Imaging Studies ordered: I ordered imaging studies including none none I independently visualized and interpreted imaging. I agree with the radiologist interpretation  Additional history obtained from chart review..  External records from outside source obtained and reviewed including prior evaluations  Cardiac Monitoring: The patient was not maintained on a cardiac monitor.  If on the cardiac monitor, I personally viewed and interpreted the cardiac monitored which showed an underlying rhythm of: N/A  Reevaluation: After the interventions noted above, I reevaluated the patient and found that they have :stayed the same  Social Determinants of Health:  lives independently  Disposition: Discharge  Co morbidities that complicate the patient evaluation  Past Medical History:  Diagnosis Date   Abnormal gait 06/11/2020   Acute left ankle pain 10/11/2021   AKI (acute kidney injury) 10/08/2021   Allergic rhinitis 06/11/2020   Anemia    Arthritis    Right knee   Asthma 06/11/2020   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   Back pain    Disk problem   Benign intracranial hypertension 06/11/2020   Bilateral leg weakness 08/01/2019   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/04/2013   Body mass index (BMI) 45.0-49.9, adult 06/11/2020   Bowel incontinence 06/11/2020   Cardiomyopathy    Cholelithiasis 06/11/2020   Chronic respiratory failure 09/14/2013   Chronic sinusitis 06/11/2020   Chronic  systolic heart failure 10/27/2012   a) NICM b) ECHO (03/2013) EF 20-25% c) ECHO (09/2013)  EF 45-50%, grade I DD   CKD (chronic kidney disease) stage 3, GFR 30-59 ml/min 11/08/2018   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   Complication of anesthesia    History of low blood pressure after surgery; attributed to lying flat   Daytime somnolence 06/11/2020   Decreased estrogen level 08/01/2021   Diabetic neuropathy    Diarrhea of presumed infectious origin 06/11/2020   Edema 06/11/2020   Elevated troponin 12/02/2021   Endometrial polyp 01/20/2012   Exertional shortness of breath    Generalized anxiety disorder    GERD (gastroesophageal reflux disease)    Gout 06/11/2020   Heart murmur    History of colonic polyps 06/11/2020   History of fall 06/11/2020   Hyperlipidemia    Hyperparathyroidism 08/01/2021   Hypertension    Hypokalemia 11/08/2018   Hyponatremia 10/08/2021   Hypothyroidism    Insomnia 06/11/2020   Intractable nausea and vomiting 10/08/2021   Irritable bowel syndrome 01/04/2020   Left bundle branch block    s/p CRT-D (04/2013)   Lumbago without sciatica 04/30/2021   Lumbar spondylosis with myelopathy 06/11/2020   Macrocytosis 06/11/2020   Major depressive disorder    Malignant tumor of breast 03/26/2011   Left; completed chemotherapy and radiation treatments   Migraines    Mild dementia 04/20/2023   On home oxygen  therapy    "2L suppose to be q night" (05/03/2013)   Orthostatic hypotension 07/28/2017   Spinal stenosis of lumbar region 01/03/2020   Spondylolisthesis 08/01/2021   SVD (spontaneous vaginal delivery)    x 2   Type II diabetes mellitus 06/15/2019   Varicose veins of bilateral lower extremities with other complications 08/01/2021   Vitamin D  deficiency 03/26/2011     Medicines Meds ordered this encounter  Medications   doxycycline (VIBRA-TABS) tablet 100 mg   doxycycline (VIBRAMYCIN) 100 MG capsule    Sig: Take 1 capsule (100 mg total) by mouth 2 (two) times daily.    Dispense:  20 capsule    Refill:  0    I have reviewed the patients home  medicines and have made adjustments as needed  Problem List / ED Course: Problem List Items Addressed This Visit   None Visit Diagnoses       Cellulitis of right lower extremity    -  Primary                   Final Clinical Impression(s) / ED Diagnoses Final diagnoses:  Cellulitis of right lower extremity    Rx / DC Orders ED Discharge Orders          Ordered    doxycycline (VIBRAMYCIN) 100 MG capsule  2 times daily        10/25/23 0445              Rory Collard, MD 10/25/23 2312

## 2023-10-27 ENCOUNTER — Other Ambulatory Visit (HOSPITAL_COMMUNITY): Payer: Self-pay

## 2023-10-27 NOTE — Progress Notes (Signed)
 Paramedicine Encounter    Patient ID: Rhonda [name], female    DOB: 11-Nov-1950, 73 y.o.   MRN: [id number]  MED REC VISIT   Compliance with meds- two noon doses missed   Pill box filled- for three weeks   Refills needed-  Next week- clarinex , citalopram ,bentyl    Following week- spiro, risperidone , torsemide    Meds changes since last visit- doxycycline for one week      Social changes- meals on wheels should be starting soon    VISIT SUMMARY- Met with Velda in the home today for a med rec. She is doing well- she was seen in ER for cellulitis in foot earlier this week. She is on doxycycline for same. I reviewed meds and filled three weeks of pill box due to my upcoming vacation- her husband over sees her meds and is aware of refills needed and where they are to be placed. I provided him with other community paramedics katie and dede in case of a need. Home visit complete. I will see her in three weeks.       ACTION: Home visit completed     Patient Care Team: Ransom Byers, MD as PCP - General (Family Medicine) Tammie Fall, MD as PCP - Electrophysiology (Cardiology) Denton Flakes, LCSW as Social Worker (Licensed Clinical Social Worker) Lydia Sams, Michele Ahle, DO as Consulting Physician (Neurology) Roberts Ching, Paramedic as Paramedic Donia Furlough, Ancel Kass, MD as Attending Physician (Family Medicine) Bensimhon, Rheta Celestine, MD as Consulting Physician (Cardiology) Arturo Late, MD as Consulting Physician (Psychiatry) Velma Ghazi, DPM as Consulting Physician (Podiatry) Thomasena Fleming, NP as Nurse Practitioner (Cardiology) Luella Sager, DPM as Consulting Physician (Podiatry)  Patient Active Problem List   Diagnosis Date Noted   Mild dementia 04/20/2023   Diabetic neuropathy    Elevated troponin 12/02/2021   Acute left ankle pain 10/11/2021   Hyponatremia 10/08/2021   Decreased estrogen level 08/01/2021   Hyperparathyroidism 08/01/2021    Spondylolisthesis 08/01/2021   Varicose veins of bilateral lower extremities with other complications 08/01/2021   Lumbago without sciatica 04/30/2021   Abnormal gait 06/11/2020   Allergic rhinitis 06/11/2020   Asthma 06/11/2020   Benign intracranial hypertension 06/11/2020   Body mass index (BMI) 45.0-49.9, adult 06/11/2020   Bowel incontinence 06/11/2020   Cholelithiasis 06/11/2020   Chronic sinusitis 06/11/2020   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   Daytime somnolence 06/11/2020   Edema 06/11/2020   Family history of malignant neoplasm of gastrointestinal tract 06/11/2020   Gout 06/11/2020   Insomnia 06/11/2020   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   Lumbar spondylosis with myelopathy 06/11/2020   Macrocytosis 06/11/2020   History of colonic polyps 06/11/2020   Repeated falls 06/11/2020   Irritable bowel syndrome 01/04/2020   Spinal stenosis of lumbar region 01/03/2020   Bilateral leg weakness 08/01/2019   Type II diabetes mellitus 06/15/2019   Hypokalemia 11/08/2018   CKD (chronic kidney disease) stage 3, GFR 30-59 ml/min 11/08/2018   Orthostatic hypotension 07/28/2017   On home oxygen  therapy    Migraines    Left bundle branch block    Hypothyroidism    Hypertension    Hyperlipidemia    Heart murmur    GERD (gastroesophageal reflux disease)    Exertional shortness of breath    Major depressive disorder    Back pain    Arthritis    Generalized anxiety disorder    Anemia    Chronic respiratory failure 09/14/2013   Biventricular ICD (implantable cardioverter-defibrillator) in place 08/04/2013  Chronic systolic heart failure 10/27/2012   Endometrial polyp 01/20/2012   Malignant tumor of breast 03/26/2011   Vitamin D  deficiency 03/26/2011    Current Outpatient Medications:    acetaminophen  (TYLENOL ) 500 MG tablet, Take 500 mg by mouth every 6 (six) hours as needed., Disp: , Rfl:    acetaZOLAMIDE  (DIAMOX ) 125 MG tablet, Take 1 tablet (125 mg total) by mouth daily., Disp: 90  tablet, Rfl: 3   albuterol  (VENTOLIN  HFA) 108 (90 Base) MCG/ACT inhaler, INHALE TWO PUFFS BY MOUTH EVERY 4 HOURS AS NEEDED, Disp: , Rfl:    allopurinol  (ZYLOPRIM ) 100 MG tablet, TAKE 1/2 TABLET (50 MG TOTAL) BY MOUTH DAILY., Disp: 45 tablet, Rfl: 1   azelastine  (ASTELIN ) 0.1 % nasal spray, Place 2 sprays into both nostrils 2 (two) times daily. Use in each nostril as directed, Disp: 30 mL, Rfl: 12   buPROPion  (WELLBUTRIN  XL) 150 MG 24 hr tablet, Take 1 tablet (150 mg total) by mouth daily., Disp: 30 tablet, Rfl: 5   carvedilol  (COREG ) 3.125 MG tablet, TAKE 1 TABLET (3.125 MG) BY MOUTH TWICE DAILY WITH MEALS, Disp: 180 tablet, Rfl: 3   citalopram  (CELEXA ) 20 MG tablet, Take 1 tablet (20 mg total) by mouth daily., Disp: 30 tablet, Rfl: 5   clonazePAM  (KLONOPIN ) 0.5 MG tablet, Take 1 tablet (0.5 mg total) by mouth at bedtime., Disp: 30 tablet, Rfl: 5   cyanocobalamin  (VITAMIN B12) 1000 MCG tablet, Take 1,000 mcg by mouth daily., Disp: , Rfl:    desloratadine  (CLARINEX ) 5 MG tablet, Take 1 tablet (5 mg total) by mouth daily., Disp: 90 tablet, Rfl: 3   dicyclomine  (BENTYL ) 20 MG tablet, Take 20 mg by mouth 3 (three) times daily before meals., Disp: , Rfl:    doxycycline (VIBRAMYCIN) 100 MG capsule, Take 1 capsule (100 mg total) by mouth 2 (two) times daily., Disp: 20 capsule, Rfl: 0   famotidine (PEPCID) 20 MG tablet, Take 20 mg by mouth 2 (two) times daily., Disp: , Rfl:    fluticasone  (FLONASE ) 50 MCG/ACT nasal spray, Place 1 spray into both nostrils daily., Disp: , Rfl:    gabapentin  (NEURONTIN ) 100 MG capsule, TAKE 1 CAPSULE (100 MG TOTAL) BY MOUTH 2 (TWO) TIMES DAILY., Disp: 180 capsule, Rfl: 0   levothyroxine  (SYNTHROID ) 88 MCG tablet, TAKE 1 TABLET(88 MCG) BY MOUTH DAILY BEFORE BREAKFAST, Disp: 90 tablet, Rfl: 3   memantine  (NAMENDA ) 5 MG tablet, Take 1 tablet by mouth daily., Disp: , Rfl:    metolazone  (ZAROXOLYN ) 2.5 MG tablet, TAKE 1 TABLET BY MOUTH ONCE A WEEK, Disp: 4 tablet, Rfl: 1    midodrine  (PROAMATINE ) 5 MG tablet, Take 1 tablet (5 mg total) by mouth 3 (three) times daily. (Patient taking differently: Take 10 mg by mouth 3 (three) times daily.), Disp: 200 tablet, Rfl: 3   Multiple Vitamin (MULTIVITAMIN WITH MINERALS) TABS tablet, Take 1 tablet by mouth daily., Disp: , Rfl:    nystatin (MYCOSTATIN) 100000 UNIT/ML suspension, Take 5 mLs by mouth 4 (four) times daily., Disp: , Rfl:    Ondansetron  HCl (ZOFRAN  PO), 4 mg. Take 1 tablet on the tongue and allow to dissolve, Disp: , Rfl:    phenol (CHLORASEPTIC) 1.4 % LIQD, Use as directed 1 spray in the mouth or throat as needed for throat irritation / pain., Disp: , Rfl:    potassium chloride  SA (KLOR-CON  M) 20 MEQ tablet, Take 3 tablets (60 mEq total) by mouth 2 (two) times daily., Disp: 572 tablet, Rfl: 3   risperiDONE  (  RISPERDAL ) 0.5 MG tablet, Take 1 tablet (0.5 mg total) by mouth 2 (two) times daily at 8 am and 4 pm., Disp: 60 tablet, Rfl: 5   simvastatin  (ZOCOR ) 10 MG tablet, TAKE 1 TABLET EVERY EVENING, Disp: 90 tablet, Rfl: 3   spironolactone  (ALDACTONE ) 25 MG tablet, TAKE 1 TABLET (25 MG TOTAL) BY MOUTH EVERY EVENING., Disp: 90 tablet, Rfl: 3   tirzepatide (MOUNJARO) 10 MG/0.5ML Pen, Inject 10 mg into the skin once a week., Disp: 2 mL, Rfl: 0   torsemide  (DEMADEX ) 100 MG tablet, Take 1 tablet (100 mg total) by mouth 2 (two) times daily., Disp: 180 tablet, Rfl: 3 Allergies  Allergen Reactions   Ceftin Anaphylaxis    Face and throat swell    Cefuroxime Axetil Anaphylaxis    Face and throat swell   Geodon [Ziprasidone Hcl] Hives   Lisinopril  Other (See Comments)    angioedema  lisinopril    Shellfish-Derived Products Other (See Comments)    Other reaction(s): Other  shellfish derived   Cefuroxime     Other reaction(s): anaphylaxis   Sulfacetamide Sodium-Sulfur      Other reaction(s): itch   Allopurinol  Nausea Only and Other (See Comments)    weakness   Ativan  [Lorazepam ] Itching   Diazepam  Other (See Comments)     Patient states that diazepam  doesn't relax, it has the opposite effect.  Valium    Sulfa Antibiotics Itching     Social History   Socioeconomic History   Marital status: Married    Spouse name: Gaylin Ke   Number of children: 2   Years of education: 18   Highest education level: Master's degree (e.g., MA, MS, MEng, MEd, MSW, MBA)  Occupational History   Occupation: retired  Tobacco Use   Smoking status: Every Day    Current packs/day: 0.00    Average packs/day: 0.5 packs/day for 26.0 years (13.0 ttl pk-yrs)    Types: Cigarettes    Start date: 03/24/1995    Last attempt to quit: 03/23/2021    Years since quitting: 2.5   Smokeless tobacco: Never  Vaping Use   Vaping status: Never Used  Substance and Sexual Activity   Alcohol use: No   Drug use: No   Sexual activity: Yes  Other Topics Concern   Not on file  Social History Narrative   Tobacco Use Cigarettes: Former Smoker, Quit in 2008   No Alcohol   No recreational drug use   Diet: Regular/Low Carb   Exercise: None   Occupation: disabled   Education: Company secretary, masters   Children: 2   Firearms: No   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   Two story home   Lives with spouse Gaylin Ke      Social Drivers of Health   Financial Resource Strain: Low Risk  (09/17/2023)   Overall Financial Resource Strain (CARDIA)    Difficulty of Paying Living Expenses: Not very hard  Food Insecurity: No Food Insecurity (09/17/2023)   Hunger Vital Sign    Worried About Running Out of Food in the Last Year: Never true    Ran Out of Food in the Last Year: Never true  Transportation Needs: No Transportation Needs (09/17/2023)   PRAPARE - Administrator, Civil Service (Medical): No    Lack of Transportation (Non-Medical): No  Physical Activity: Insufficiently Active (09/17/2023)   Exercise Vital Sign    Days of Exercise per Week: 1 day    Minutes of Exercise  per Session: 20 min  Stress: Stress  Concern Present (09/17/2023)   [name] of Occupational Health - Occupational Stress Questionnaire    Feeling of Stress : To some extent  Social Connections: Socially Integrated (09/17/2023)   Social Connection and Isolation Panel [NHANES]    Frequency of Communication with Friends and Family: Twice a week    Frequency of Social Gatherings with Friends and Family: Twice a week    Attends Religious Services: 1 to 4 times per year    Active Member of Clubs or Organizations: Patient unable to answer    Attends Banker Meetings: 1 to 4 times per year    Marital Status: Married  Catering manager Violence: Not At Risk (09/17/2023)   Humiliation, Afraid, Rape, and Kick questionnaire    Fear of Current or Ex-Partner: No    Emotionally Abused: No    Physically Abused: No    Sexually Abused: No    Physical Exam      Future Appointments  Date Time Provider Department Center  10/29/2023  9:30 AM Slusher, Rojean Cleaves, RN [facility] None  12/13/2023  7:20 AM [facility] DEVICE REMOTES [facility]  12/29/2023  3:00 PM Luella Sager, DPM [facility]  01/12/2024 10:30 AM Reyna Cava K, DO [facility] None  03/09/2024  3:20 PM Arfeen, Bronson Canny, MD [facility] None  03/13/2024  7:10 AM [facility] H[facility] DEVICE REMOTES [facility]  06/12/2024  7:10 AM [facility] DEVICE REMOTES [facility]  09/11/2024  7:10 AM [facility] HVT DEVICE REMOTES [facility]  12/11/2024  7:10 AM [facility] HVT DEVICE REMOTES [facility]  03/12/2025  7:10 AM [facility] HVT DEVICE REMOTES [facility]

## 2023-10-28 DIAGNOSIS — N1832 Chronic kidney disease, stage 3b: Secondary | ICD-10-CM | POA: Diagnosis not present

## 2023-10-28 DIAGNOSIS — L03115 Cellulitis of right lower limb: Secondary | ICD-10-CM | POA: Diagnosis not present

## 2023-10-28 NOTE — Progress Notes (Signed)
 EPIC Encounter for ICM Monitoring  Patient Name: Rhonda [name] is a 73 y.o. female Date: 10/28/2023 Primary Care Physican: Ransom Byers, MD Primary Cardiologist: Bensimhon Electrophysiologist: Arvid Latino Pacing: 99.7%   06/01/2022 Weight: 193 lbs  07/08/2022 Weight: 183 lbs 08/10/2022 Weight: 185 lbs 11/25/2022 Weight: 180 lbs 12/30/2022 Weight: 184 lbs 04/26/2023 Weight: 214 lbs Per EMT note 07/14/2023 Weight: 226 lbs 09/23/2023 Office Weight: 209 lbs 10/13/2023 Office Weight: 210 lbs         Transmission results reviewed.               Optivol thoracic impedance suggesting normal fluid levels since 4/28.     Prescribed:  Roberts Ching with paramedicine program assists with meds. Torsemide  100 mg 1 tablet(s) (100 mg total) by mouth twice a day  Potassium 20 meq 3 tablets (60 mEq total) by mouth 2 (twice) a day.   Metolazone  2.5mg  1 tablet by mouth once a week. Spironolactone  25 mg take 1 tablet by mouth every evening.   Labs: 10/25/2023 Creatinine 1.78, BUN 46, Potassium 3.3, Sodium 136, GFR 30 10/13/2023 Creatinine 1.91, BUN 41, Potassium 3.5, Sodium 135, GFR 28 07/28/2023 Creatinine 2.06, BUN 52, Potassium 3.5, Sodium 135, eGFR 25 07/14/2023 Creatinine 1.91, BUN 47, Potassium 3.3, Sodium 134, eGFR 28 A complete set of results can be found in Results Review.   Recommendations:  None   Follow-up plan: ICM clinic phone appointment on 12/20/2023.   91 day device clinic remote transmission 12/13/2023.     EP/Cardiology Office Visits:  Recall 02/10/2024 with Dr Julane Ny (4 month w/echo).  Recall 03/21/2024 with Dr Carolynne Citron.     Copy of ICM check sent to Dr. Carolynne Citron.   3 month ICM trend: 10/25/2023.    12-14 Month ICM trend:     Almyra Jain, RN 10/28/2023 8:14 AM

## 2023-10-29 ENCOUNTER — Other Ambulatory Visit: Payer: Self-pay

## 2023-10-29 NOTE — Patient Outreach (Addendum)
 Complex Care Management   Visit Note  11/01/2023  Name:  Rhonda [name] MRN: [id number] DOB: September 08, 1950  Situation: Referral received for Complex Care Management related to Fall Risk, needs more in-home assistance I obtained verbal consent from Caregiver.  Visit completed with Rhonda [name]/husband and caregiver  on the phone.  Patient had ED visit on 10/25/23 for cellulitis of right lower extremity with pain, husband states swelling has resolved, patient denies pain, taking antibiotics as prescribed by ED.  Husband states patient needs additional in-home aide assistance hours.   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/2013   Body mass index (BMI) 45.0-49.9, adult 06/11/2020   Bowel incontinence 06/11/2020   Cardiomyopathy    Cholelithiasis 06/11/2020   Chronic respiratory failure 09/14/2013   Chronic sinusitis 06/11/2020   Chronic systolic heart failure 10/27/2012   a) NICM b) ECHO (03/2013) EF 20-25% c) ECHO (09/2013) EF 45-50%, grade I DD   CKD (chronic kidney disease) stage 3, GFR 30-59 ml/min 11/08/2018   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   Complication of anesthesia    History of low blood pressure after surgery; attributed to lying flat   Daytime somnolence 06/11/2020   Decreased estrogen level 08/01/2021   Diabetic neuropathy    Diarrhea of presumed infectious origin 06/11/2020   Edema 06/11/2020   Elevated troponin 12/02/2021   Endometrial polyp 01/20/2012   Exertional shortness of breath    Generalized anxiety disorder    GERD (gastroesophageal reflux disease)    Gout  06/11/2020   Heart murmur    History of colonic polyps 06/11/2020   History of fall 06/11/2020   Hyperlipidemia    Hyperparathyroidism 08/01/2021   Hypertension    Hypokalemia 11/08/2018   Hyponatremia 10/08/2021   Hypothyroidism    Insomnia 06/11/2020   Intractable nausea and vomiting 10/08/2021   Irritable bowel syndrome 01/04/2020   Left bundle branch block    s/p CRT-D (04/2013)   Lumbago without sciatica 04/30/2021   Lumbar spondylosis with myelopathy 06/11/2020   Macrocytosis 06/11/2020   Major depressive disorder    Malignant tumor of breast 03/26/2011   Left; completed chemotherapy and radiation treatments   Migraines    Mild dementia 04/20/2023   On home oxygen  therapy    "2L suppose to be q night" (05/03/2013)   Orthostatic hypotension 07/28/2017   Spinal stenosis of lumbar region 01/03/2020   Spondylolisthesis 08/01/2021   SVD (spontaneous vaginal delivery)    x 2   Type II diabetes mellitus 06/15/2019   Varicose veins of bilateral lower extremities with other complications 08/01/2021   Vitamin D  deficiency 03/26/2011    Assessment: Patient Reported Symptoms:  Cognitive Cognitive Status: Able to follow simple commands, Requires Assistance Decision Making, Struggling with memory recall      Neurological Neurological Review of Symptoms: No symptoms reported Neurological Conditions: Dementia  HEENT HEENT Symptoms Reported: Not assessed      Cardiovascular Cardiovascular Symptoms Reported: No symptoms reported Cardiovascular Conditions: Heart failure, Hypertension, High blood cholesterol Cardiovascular Management Strategies: Medication therapy Cardiovascular Self-Management Outcome: 4 (good)  Respiratory  Respiratory Symptoms Reported: No symptoms reported    Endocrine Patient reports the following symptoms related to hypoglycemia or hyperglycemia : No symptoms reported (FBS 98 on 10/25/2023, A1C 7.0% 10/09/21) Is patient diabetic?: Yes Endocrine Conditions:  Diabetes Endocrine Management Strategies: Medication therapy Endocrine Self-Management Outcome: 4 (good)  Gastrointestinal Gastrointestinal Symptoms Reported: No symptoms reported      Genitourinary Genitourinary Symptoms Reported: No symptoms reported    Integumentary Integumentary Symptoms Reported: No symptoms reported    Musculoskeletal Musculoskelatal Symptoms Reviewed: Unsteady gait Musculoskeletal Conditions: Unsteady gait Falls in the past year?: No    Psychosocial Psychosocial Symptoms Reported: Other Additional Psychological Details: Spoke with patient's spouse, states he is gathering tax forms to complete Medicaid application.  Patient continues to have weekly visits from parametic for medication reconcilliation and pillbox fill. This RNCM had conversation with parametic, she states patient mostly takes all of her pills as prescribed, she may miss 1-2 noon time pills but otherwise is compliant.   Continues to have DSS aide twice a week, a couple of hours a day. Behavioral Management Strategies: Support system Behavioral Health Self-Management Outcome: 3 (uncertain)          09/17/2023    4:43 PM  Depression screen PHQ 2/9  Decreased Interest 0  Down, Depressed, Hopeless 1  PHQ - 2 Score 1    There were no vitals filed for this visit.  Medications Reviewed Today     Reviewed by Isadore Marble, RN (Registered Nurse) on 10/29/23 at 1021  Med List Status: <None>   Medication Order Taking? Sig Documenting Provider Last Dose Status Informant  acetaminophen  (TYLENOL ) 500 MG tablet [id number] Yes Take 500 mg by mouth every 6 (six) hours as needed. [provider] Taking Active   acetaZOLAMIDE  (DIAMOX ) 125 MG tablet [id number] Yes Take 1 tablet (125 mg total) by mouth daily. Patel, Donika K, DO Taking Active Self, Pharmacy Records  albuterol  (VENTOLIN  HFA) 108 [phone] Base) MCG/ACT inhaler [id number] Yes INHALE TWO PUFFS BY MOUTH EVERY 4 HOURS AS NEEDED [provider] Taking Active   allopurinol  (ZYLOPRIM ) 100 MG tablet [id number] Yes TAKE 1/2 TABLET (50 MG TOTAL) BY MOUTH DAILY. Bensimhon, Rheta Celestine, MD Taking Active   azelastine  (ASTELIN ) 0.1 % nasal spray [id number] Yes Place 2 sprays into both nostrils 2 (two) times daily. Use in each nostril as directed Artice Last, MD Taking Active   buPROPion  (WELLBUTRIN  XL) 150 MG 24 hr tablet [id number] Yes Take 1 tablet (150 mg total) by mouth daily. Arfeen, Syed T, MD Taking Active   carvedilol  (COREG ) 3.125 MG tablet [id number] Yes TAKE 1 TABLET (3.125 MG) BY MOUTH TWICE DAILY WITH MEALS Arleene Belt, PA-C Taking Active   citalopram  (CELEXA ) 20 MG tablet [id number] Yes Take 1 tablet (20 mg total) by mouth daily. Arfeen, Syed T, MD Taking Active   clonazePAM  (KLONOPIN ) 0.5 MG tablet [id number] Yes Take 1 tablet (0.5 mg total) by mouth at bedtime. Arturo Late, MD Taking Active   cyanocobalamin  (VITAMIN B12) 1000 MCG tablet [id number] Yes Take 1,000 mcg by mouth daily. [provider] Taking Active Self, Pharmacy Records  desloratadine  (CLARINEX ) 5 MG tablet [id number] Yes Take 1 tablet (5 mg total) by mouth daily. Soldatova, Liuba, MD Taking Active            Med Note Merrilyn Abler, [facility] Feb 15, 2023  2:56 PM)    dicyclomine  (BENTYL ) 20 MG tablet [id number]  Take 20 mg by mouth 3 (three) times  daily before meals. [provider]  Active Self, Pharmacy Records  doxycycline  (VIBRAMYCIN ) 100 MG capsule [id number]  Take 1 capsule (100 mg total) by mouth 2 (two) times daily. Horton, Vonzella Guernsey, MD  Active   famotidine (PEPCID) 20 MG tablet [id number] Yes Take 20 mg by mouth 2 (two) times daily. [provider] Taking Active            Med Note Gaetano Jordan, JENNA B   Wed Jul 14, 2023 10:08 AM) As needed   fluticasone  (FLONASE ) 50 MCG/ACT nasal spray [id number] Yes Place 1 spray into both nostrils daily. [provider] Taking Active Self, Pharmacy Records  gabapentin   (NEURONTIN ) 100 MG capsule [id number] Yes TAKE 1 CAPSULE (100 MG TOTAL) BY MOUTH 2 (TWO) TIMES DAILY. Patel, Donika K, DO Taking Active   levothyroxine  (SYNTHROID ) 88 MCG tablet [id number] Yes TAKE 1 TABLET(88 MCG) BY MOUTH DAILY BEFORE Basil Lim, MD Taking Active Self, Pharmacy Records  memantine  (NAMENDA ) 5 MG tablet [id number] Yes Take 1 tablet by mouth daily. [provider] Taking Active   metolazone  (ZAROXOLYN ) 2.5 MG tablet [id number] Yes TAKE 1 TABLET BY MOUTH ONCE A WEEK Bensimhon, Rheta Celestine, MD Taking Active   midodrine  (PROAMATINE ) 5 MG tablet [id number] Yes Take 1 tablet (5 mg total) by mouth 3 (three) times daily.  Patient taking differently: Take 10 mg by mouth 3 (three) times daily.   Ruddy Corral M, PA-C Taking Active   Multiple Vitamin (MULTIVITAMIN WITH MINERALS) TABS tablet [id number] Yes Take 1 tablet by mouth daily. [provider] Taking Active Self, Pharmacy Records  nystatin (MYCOSTATIN) 100000 UNIT/ML suspension [id number] Yes Take 5 mLs by mouth 4 (four) times daily. [provider] Taking Active   Ondansetron  HCl (ZOFRAN  PO) [id number] Yes 4 mg. Take 1 tablet on the tongue and allow to dissolve [provider] Taking Active            Med Note Merrilyn Abler, [facility]   Mon Jan 04, 2023 12:24 PM) Taking PRN   phenol (CHLORASEPTIC) 1.4 % LIQD [id number] Yes Use as directed 1 spray in the mouth or throat as needed for throat irritation / pain. [provider] Taking Active   potassium chloride  SA (KLOR-CON  M) 20 MEQ tablet [id number] Yes Take 3 tablets (60 mEq total) by mouth 2 (two) times daily. Sabharwal, Aditya, DO Taking Active   risperiDONE  (RISPERDAL ) 0.5 MG tablet [id number] Yes Take 1 tablet (0.5 mg total) by mouth 2 (two) times daily at 8 am and 4 pm. Arturo Late, MD Taking Active   simvastatin  (ZOCOR ) 10 MG tablet [id number] Yes TAKE 1 TABLET EVERY EVENING Bensimhon, Rheta Celestine, MD Taking Active   spironolactone   (ALDACTONE ) 25 MG tablet [id number] Yes TAKE 1 TABLET (25 MG TOTAL) BY MOUTH EVERY EVENING. Bensimhon, Rheta Celestine, MD Taking Active   tirzepatide [facility]) 10 MG/0.5ML Pen [id number] Yes Inject 10 mg into the skin once a week. Bensimhon, Rheta Celestine, MD Taking Active   torsemide  (DEMADEX ) 100 MG tablet [id number] Yes Take 1 tablet (100 mg total) by mouth 2 (two) times daily. [place], Arlice Bene, FNP Taking Active             Recommendation:   Continue Current Plan of Care -Mr. Kiester is working on completing Medicaid forms, hoping to get more in-home assistance hours for patient.   -Educated on monitoring for leg edema, finishing antibiotics as prescribed by ED on 10/25/23, Doxycycline  100mg  1 cap twice a day x 10 days.  Follow Up Plan:   Telephone follow-up in 1 week  Jurline Olmsted BSN, CCM [place]  [facility] Population Health RN Care Manager Direct Dial: [phone]  Fax: [phone]

## 2023-10-29 NOTE — Patient Instructions (Signed)
 Visit Information  Thank you for taking time to visit with me today. Please don't hesitate to contact me if I can be of assistance to you before our next scheduled appointment.  Your next care management appointment is by telephone on Thursday, June 12th  at 3:45pm.   Please call the care guide team at [phone] if you need to cancel, schedule, or reschedule an appointment.   A reminder to ALL patients/family/friends, please call the [place]  National Suicide Prevention Lifeline: 9703131872 or TTY: [phone] TTY (306)732-5639) to talk to a trained counselor if you are experiencing a Mental Health or Behavioral Health Crisis or need someone to talk to.  Jurline Olmsted BSN, CCM [place]  VBCI Population Health RN Care Manager Direct Dial: [phone]  Fax: [phone]

## 2023-11-01 NOTE — Progress Notes (Signed)
 Remote ICD transmission.

## 2023-11-01 NOTE — Addendum Note (Signed)
 Addended by: Edra Govern D on: 11/01/2023 12:36 PM   Modules accepted: Orders

## 2023-11-02 ENCOUNTER — Other Ambulatory Visit (INDEPENDENT_AMBULATORY_CARE_PROVIDER_SITE_OTHER): Payer: Self-pay | Admitting: Otolaryngology

## 2023-11-04 ENCOUNTER — Other Ambulatory Visit (HOSPITAL_COMMUNITY): Payer: Self-pay | Admitting: Internal Medicine

## 2023-11-04 ENCOUNTER — Other Ambulatory Visit: Payer: Self-pay

## 2023-11-09 DIAGNOSIS — I5041 Acute combined systolic (congestive) and diastolic (congestive) heart failure: Secondary | ICD-10-CM | POA: Diagnosis not present

## 2023-11-09 DIAGNOSIS — I5032 Chronic diastolic (congestive) heart failure: Secondary | ICD-10-CM | POA: Diagnosis not present

## 2023-11-09 DIAGNOSIS — N1832 Chronic kidney disease, stage 3b: Secondary | ICD-10-CM | POA: Diagnosis not present

## 2023-11-09 DIAGNOSIS — E1140 Type 2 diabetes mellitus with diabetic neuropathy, unspecified: Secondary | ICD-10-CM | POA: Diagnosis not present

## 2023-11-11 ENCOUNTER — Telehealth (HOSPITAL_COMMUNITY): Payer: Self-pay

## 2023-11-11 NOTE — Telephone Encounter (Signed)
 Contacted [facility] to request the following for refills:   Spiro-p/u today already  Risperidone -will fill  torsemide  -p/u today already   Alfonza Angry, Paramedic [phone] 11/11/23

## 2023-11-17 ENCOUNTER — Other Ambulatory Visit (HOSPITAL_COMMUNITY): Payer: Self-pay

## 2023-11-17 NOTE — Progress Notes (Signed)
 Paramedicine Encounter    Patient ID: Rhonda [name], female    DOB: 1950-06-27, 73 y.o.   MRN: [id number]   Complaints- none   Assessment- CAOX4, warm and dry seated in chair in her room reporting to be feeling well with no complaints, lungs clear, some mild lower leg edema with no pitting. Vitals within normal limits.   Compliance with meds- missed one noon and one evening dose in the last three weeks   Pill box filled- for one week   Refills needed-  Risperidone  Midodrine  Clonazepam  Namenda    Meds changes since last visit- none     Social changes- none    VISIT SUMMARY- Arrived for home visit for Rhonda [name] who reports to be feeling well with no complaints. Assessment and vitals as noted. Lungs clear, mild edema noted in both lower legs with no pitting. Meds reviewed and confirmed- pill box filled for one week. Rhonda [name] has received meals on wheels but has been eating some take out- I educated her on the need to maintain a heart healthy diet and avoid sodium and watch her fluid intakes. She and her husband agreed. Home visit complete. I will see her in one week.   BP 98/62   Pulse 90   Resp 16   Wt 206 lb 9.6 oz (93.7 kg)   SpO2 98%   BMI 37.79 kg/m  Weight yesterday-- none  Last visit weight-- 209lbs      ACTION: Home visit completed     Patient Care Team: Chrystal Lamarr GORMAN, MD as PCP - General (Family Medicine) Waddell Danelle ORN, MD as PCP - Electrophysiology (Cardiology) Cathern Andriette DEL, LCSW as Social Worker (Licensed Clinical Social Worker) Tobie, Tonita POUR, DO as Consulting Physician (Neurology) Jacques Moats, Paramedic as Paramedic Chrystal, Lamarr GORMAN, MD as Attending Physician (Family Medicine) Bensimhon, Toribio SAUNDERS, MD as Consulting Physician (Cardiology) Curry, Leni DASEN, MD as Consulting Physician (Psychiatry) Tobie Franky SQUIBB, DPM as Consulting Physician (Podiatry) Aniceto Daphne CROME, NP as Nurse Practitioner (Cardiology) Gaynel Delon CROME, DPM as  Consulting Physician (Podiatry) Slusher, Santana LABOR, RN as Registered Nurse  Patient Active Problem List   Diagnosis Date Noted   Mild dementia 04/20/2023   Diabetic neuropathy    Elevated troponin 12/02/2021   Acute left ankle pain 10/11/2021   Hyponatremia 10/08/2021   Decreased estrogen level 08/01/2021   Hyperparathyroidism 08/01/2021   Spondylolisthesis 08/01/2021   Varicose veins of bilateral lower extremities with other complications 08/01/2021   Lumbago without sciatica 04/30/2021   Abnormal gait 06/11/2020   Allergic rhinitis 06/11/2020   Asthma 06/11/2020   Benign intracranial hypertension 06/11/2020   Body mass index (BMI) 45.0-49.9, adult 06/11/2020   Bowel incontinence 06/11/2020   Cholelithiasis 06/11/2020   Chronic sinusitis 06/11/2020   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   Daytime somnolence 06/11/2020   Edema 06/11/2020   Family history of malignant neoplasm of gastrointestinal tract 06/11/2020   Gout 06/11/2020   Insomnia 06/11/2020   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   Lumbar spondylosis with myelopathy 06/11/2020   Macrocytosis 06/11/2020   History of colonic polyps 06/11/2020   Repeated falls 06/11/2020   Irritable bowel syndrome 01/04/2020   Spinal stenosis of lumbar region 01/03/2020   Bilateral leg weakness 08/01/2019   Type II diabetes mellitus 06/15/2019   Hypokalemia 11/08/2018   CKD (chronic kidney disease) stage 3, GFR 30-59 ml/min 11/08/2018   Orthostatic hypotension 07/28/2017   On home oxygen  therapy    Migraines    Left bundle branch block  Hypothyroidism    Hypertension    Hyperlipidemia    Heart murmur    GERD (gastroesophageal reflux disease)    Exertional shortness of breath    Major depressive disorder    Back pain    Arthritis    Generalized anxiety disorder    Anemia    Chronic respiratory failure 09/14/2013   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   Chronic systolic heart failure 10/27/2012    Endometrial polyp 01/20/2012   Malignant tumor of breast 03/26/2011   Vitamin D  deficiency 03/26/2011    Current Outpatient Medications:    acetaminophen  (TYLENOL ) 500 MG tablet, Take 500 mg by mouth every 6 (six) hours as needed., Disp: , Rfl:    acetaZOLAMIDE  (DIAMOX ) 125 MG tablet, Take 1 tablet (125 mg total) by mouth daily., Disp: 90 tablet, Rfl: 3   albuterol  (VENTOLIN  HFA) 108 (90 Base) MCG/ACT inhaler, INHALE TWO PUFFS BY MOUTH EVERY 4 HOURS AS NEEDED, Disp: , Rfl:    allopurinol  (ZYLOPRIM ) 100 MG tablet, TAKE 1/2 TABLET (50 MG TOTAL) BY MOUTH DAILY., Disp: 45 tablet, Rfl: 1   azelastine  (ASTELIN ) 0.1 % nasal spray, Place 2 sprays into both nostrils 2 (two) times daily. Use in each nostril as directed, Disp: 30 mL, Rfl: 12   buPROPion  (WELLBUTRIN  XL) 150 MG 24 hr tablet, Take 1 tablet (150 mg total) by mouth daily., Disp: 30 tablet, Rfl: 5   carvedilol  (COREG ) 3.125 MG tablet, TAKE 1 TABLET (3.125 MG) BY MOUTH TWICE DAILY WITH MEALS, Disp: 180 tablet, Rfl: 3   citalopram  (CELEXA ) 20 MG tablet, Take 1 tablet (20 mg total) by mouth daily., Disp: 30 tablet, Rfl: 5   clonazePAM  (KLONOPIN ) 0.5 MG tablet, Take 1 tablet (0.5 mg total) by mouth at bedtime., Disp: 30 tablet, Rfl: 5   cyanocobalamin  (VITAMIN B12) 1000 MCG tablet, Take 1,000 mcg by mouth daily., Disp: , Rfl:    desloratadine  (CLARINEX ) 5 MG tablet, TAKE 1 TABLET (5 MG TOTAL) BY MOUTH DAILY FOR ALLERGIES, Disp: 90 tablet, Rfl: 3   dicyclomine  (BENTYL ) 20 MG tablet, Take 20 mg by mouth 3 (three) times daily before meals., Disp: , Rfl:    doxycycline  (VIBRAMYCIN ) 100 MG capsule, Take 1 capsule (100 mg total) by mouth 2 (two) times daily., Disp: 20 capsule, Rfl: 0   famotidine (PEPCID) 20 MG tablet, Take 20 mg by mouth 2 (two) times daily., Disp: , Rfl:    fluticasone  (FLONASE ) 50 MCG/ACT nasal spray, Place 1 spray into both nostrils daily., Disp: , Rfl:    gabapentin  (NEURONTIN ) 100 MG capsule, TAKE 1 CAPSULE (100 MG TOTAL) BY MOUTH 2  (TWO) TIMES DAILY., Disp: 180 capsule, Rfl: 0   levothyroxine  (SYNTHROID ) 88 MCG tablet, TAKE 1 TABLET(88 MCG) BY MOUTH DAILY BEFORE BREAKFAST, Disp: 90 tablet, Rfl: 3   memantine  (NAMENDA ) 5 MG tablet, Take 1 tablet by mouth daily. (Patient taking differently: Take 1 tablet by mouth 2 (two) times daily.), Disp: , Rfl:    metolazone  (ZAROXOLYN ) 2.5 MG tablet, TAKE 1 TABLET BY MOUTH ONCE A WEEK, Disp: 4 tablet, Rfl: 1   midodrine  (PROAMATINE ) 5 MG tablet, Take 1 tablet (5 mg total) by mouth 3 (three) times daily., Disp: 200 tablet, Rfl: 3   Multiple Vitamin (MULTIVITAMIN WITH MINERALS) TABS tablet, Take 1 tablet by mouth daily., Disp: , Rfl:    nystatin (MYCOSTATIN) 100000 UNIT/ML suspension, Take 5 mLs by mouth 4 (four) times daily., Disp: , Rfl:    Ondansetron  HCl (ZOFRAN  PO), 4  mg. Take 1 tablet on the tongue and allow to dissolve, Disp: , Rfl:    phenol (CHLORASEPTIC) 1.4 % LIQD, Use as directed 1 spray in the mouth or throat as needed for throat irritation / pain., Disp: , Rfl:    potassium chloride  SA (KLOR-CON  M) 20 MEQ tablet, Take 3 tablets (60 mEq total) by mouth 2 (two) times daily., Disp: 572 tablet, Rfl: 3   risperiDONE  (RISPERDAL ) 0.5 MG tablet, Take 1 tablet (0.5 mg total) by mouth 2 (two) times daily at 8 am and 4 pm., Disp: 60 tablet, Rfl: 5   simvastatin  (ZOCOR ) 10 MG tablet, TAKE 1 TABLET EVERY EVENING, Disp: 90 tablet, Rfl: 3   spironolactone  (ALDACTONE ) 25 MG tablet, TAKE 1 TABLET (25 MG TOTAL) BY MOUTH EVERY EVENING., Disp: 90 tablet, Rfl: 3   tirzepatide  (MOUNJARO ) 10 MG/0.5ML Pen, Inject 10 mg into the skin once a week., Disp: 2 mL, Rfl: 0   torsemide  (DEMADEX ) 100 MG tablet, Take 1 tablet (100 mg total) by mouth 2 (two) times daily., Disp: 180 tablet, Rfl: 3 Allergies  Allergen Reactions   Ceftin Anaphylaxis    Face and throat swell    Cefuroxime Axetil Anaphylaxis    Face and throat swell   Geodon [Ziprasidone Hcl] Hives   Lisinopril  Other (See Comments)     angioedema  lisinopril    Shellfish-Derived Products Other (See Comments)    Other reaction(s): Other  shellfish derived   Cefuroxime     Other reaction(s): anaphylaxis   Sulfacetamide Sodium-Sulfur      Other reaction(s): itch   Allopurinol  Nausea Only and Other (See Comments)    weakness   Ativan  [Lorazepam ] Itching   Diazepam  Other (See Comments)    Patient states that diazepam  doesn't relax, it has the opposite effect.  Valium    Sulfa Antibiotics Itching     Social History   Socioeconomic History   Marital status: Married    Spouse name: Dallas   Number of children: 2   Years of education: 18   Highest education level: Master's degree (e.g., MA, MS, MEng, MEd, MSW, MBA)  Occupational History   Occupation: retired  Tobacco Use   Smoking status: Every Day    Current packs/day: 0.00    Average packs/day: 0.5 packs/day for 26.0 years (13.0 ttl pk-yrs)    Types: Cigarettes    Start date: 03/24/1995    Last attempt to quit: 03/23/2021    Years since quitting: 2.6   Smokeless tobacco: Never  Vaping Use   Vaping status: Never Used  Substance and Sexual Activity   Alcohol use: No   Drug use: No   Sexual activity: Yes  Other Topics Concern   Not on file  Social History Narrative   Tobacco Use Cigarettes: Former Smoker, Quit in 2008   No Alcohol   No recreational drug use   Diet: Regular/Low Carb   Exercise: None   Occupation: disabled   Education: Company secretary, masters   Children: 2   Firearms: No   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   Two story home   Lives with spouse Dallas      Social Drivers of Health   Financial Resource Strain: Low Risk  (09/17/2023)   Overall Financial Resource Strain (CARDIA)    Difficulty of Paying Living Expenses: Not very hard  Food Insecurity: No Food Insecurity (11/01/2023)   Hunger Vital Sign    Worried About Running Out of Food in  the Last Year: Never true    Ran Out of Food in the Last Year:  Never true  Transportation Needs: No Transportation Needs (11/01/2023)   PRAPARE - Administrator, Civil Service (Medical): No    Lack of Transportation (Non-Medical): No  Physical Activity: Insufficiently Active (09/17/2023)   Exercise Vital Sign    Days of Exercise per Week: 1 day    Minutes of Exercise per Session: 20 min  Stress: Stress Concern Present (09/17/2023)   [name] of Occupational Health - Occupational Stress Questionnaire    Feeling of Stress : To some extent  Social Connections: Socially Integrated (09/17/2023)   Social Connection and Isolation Panel    Frequency of Communication with Friends and Family: Twice a week    Frequency of Social Gatherings with Friends and Family: Twice a week    Attends Religious Services: 1 to 4 times per year    Active Member of Clubs or Organizations: Patient unable to answer    Attends Banker Meetings: 1 to 4 times per year    Marital Status: Married  Catering manager Violence: Not At Risk (11/01/2023)   Humiliation, Afraid, Rape, and Kick questionnaire    Fear of Current or Ex-Partner: No    Emotionally Abused: No    Physically Abused: No    Sexually Abused: No    Physical Exam      Future Appointments  Date Time Provider Department Center  12/13/2023  7:20 AM [facility] DEVICE [facility]OTES [facility]  12/20/2023  7:25 AM [facility] DEVICE [facility]OT[facility]  12/29/2023  3:00 PM Gaynel Delon CROME, DPM [facility]  01/12/2024 10:30 AM Tobie Breslow K, DO [facility] None  03/09/2024  3:20 PM Arfeen, Leni DASEN, MD [facility] None  03/13/2024  7:10 AM [facility] DEVICE REMOTES [facility]  06/12/2024  7:10 AM [facility] DEVICE REMOTES [facility]  09/11/2024  7:10 AM [facility] DEVICE REMOTES [facility]  12/11/2024  7:10 AM [facility] DEVICE REMOTES [facility]  03/12/2025  7:10 AM [facility] DEVICE REMOTES [facility]

## 2023-11-18 ENCOUNTER — Other Ambulatory Visit (HOSPITAL_COMMUNITY): Payer: Self-pay | Admitting: Psychiatry

## 2023-11-18 DIAGNOSIS — R4189 Other symptoms and signs involving cognitive functions and awareness: Secondary | ICD-10-CM

## 2023-11-18 DIAGNOSIS — F330 Major depressive disorder, recurrent, mild: Secondary | ICD-10-CM

## 2023-11-22 DIAGNOSIS — E039 Hypothyroidism, unspecified: Secondary | ICD-10-CM | POA: Diagnosis not present

## 2023-11-22 DIAGNOSIS — E1140 Type 2 diabetes mellitus with diabetic neuropathy, unspecified: Secondary | ICD-10-CM | POA: Diagnosis not present

## 2023-11-22 DIAGNOSIS — N1832 Chronic kidney disease, stage 3b: Secondary | ICD-10-CM | POA: Diagnosis not present

## 2023-11-22 DIAGNOSIS — I5032 Chronic diastolic (congestive) heart failure: Secondary | ICD-10-CM | POA: Diagnosis not present

## 2023-11-22 DIAGNOSIS — I130 Hypertensive heart and chronic kidney disease with heart failure and stage 1 through stage 4 chronic kidney disease, or unspecified chronic kidney disease: Secondary | ICD-10-CM | POA: Diagnosis not present

## 2023-11-22 DIAGNOSIS — I5041 Acute combined systolic (congestive) and diastolic (congestive) heart failure: Secondary | ICD-10-CM | POA: Diagnosis not present

## 2023-11-24 ENCOUNTER — Other Ambulatory Visit (HOSPITAL_COMMUNITY): Payer: Self-pay

## 2023-11-24 NOTE — Progress Notes (Signed)
 Paramedicine Encounter    Patient ID: Rhonda [name], female    DOB: 07-Feb-1951, 73 y.o.   MRN: [id number]   Complaints- none   Assessment- CAOx4, warm and dry seated in bed room chair, some swelling to right lower leg, no pitting, lungs clear, vitals within normal limits. Weight down 1 lbs.   Compliance with meds- missed one noon dose   Pill box filled- for one week   Refills needed- midodrine  5mg    Meds changes since last visit- none     Social changes- none    VISIT SUMMARY- Arrived for home visit for Rhonda [name] who reports to be feeling well today with no complaints. She denied shortness of breath, dizziness or chest pain. She did admit her right lower leg felt more swollen. She has not been wearing her compression hose or elevating her feet- she sits in a chair most hours of the day. I encouraged her to elevate her legs and wear her compression hose. She agreed. I obtained vitals as noted. Lungs clear. Medications reviewed and confirmed. Pill box filled for one week. Refills will be called into [facility]. Appointments reviewed. HF education provided. Fluid and sodium restrictions discussed with her and her husband. Home visit complete. I will see her in one week.   BP 102/60   Pulse 84   Wt 205 lb (93 kg)   SpO2 98%   BMI 37.49 kg/m  Weight yesterday-- didn't weigh  Last visit weight-- 206lbs      ACTION: Home visit completed     Patient Care Team: Chrystal Lamarr GORMAN, MD as PCP - General (Family Medicine) Waddell Danelle ORN, MD as PCP - Electrophysiology (Cardiology) Cathern Andriette DEL, LCSW as Social Worker (Licensed Clinical Social Worker) Tobie, Tonita POUR, DO as Consulting Physician (Neurology) Jacques Moats, Paramedic as Paramedic Chrystal, Lamarr GORMAN, MD as Attending Physician (Family Medicine) Bensimhon, Toribio SAUNDERS, MD as Consulting Physician (Cardiology) Curry, Leni DASEN, MD as Consulting Physician (Psychiatry) Tobie Franky SQUIBB, DPM as Consulting Physician  (Podiatry) Aniceto Daphne CROME, NP as Nurse Practitioner (Cardiology) Gaynel Delon CROME, DPM as Consulting Physician (Podiatry) Slusher, Santana LABOR, RN as Registered Nurse  Patient Active Problem List   Diagnosis Date Noted   Mild dementia 04/20/2023   Diabetic neuropathy    Elevated troponin 12/02/2021   Acute left ankle pain 10/11/2021   Hyponatremia 10/08/2021   Decreased estrogen level 08/01/2021   Hyperparathyroidism 08/01/2021   Spondylolisthesis 08/01/2021   Varicose veins of bilateral lower extremities with other complications 08/01/2021   Lumbago without sciatica 04/30/2021   Abnormal gait 06/11/2020   Allergic rhinitis 06/11/2020   Asthma 06/11/2020   Benign intracranial hypertension 06/11/2020   Body mass index (BMI) 45.0-49.9, adult 06/11/2020   Bowel incontinence 06/11/2020   Cholelithiasis 06/11/2020   Chronic sinusitis 06/11/2020   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   Daytime somnolence 06/11/2020   Edema 06/11/2020   Family history of malignant neoplasm of gastrointestinal tract 06/11/2020   Gout 06/11/2020   Insomnia 06/11/2020   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   Lumbar spondylosis with myelopathy 06/11/2020   Macrocytosis 06/11/2020   History of colonic polyps 06/11/2020   Repeated falls 06/11/2020   Irritable bowel syndrome 01/04/2020   Spinal stenosis of lumbar region 01/03/2020   Bilateral leg weakness 08/01/2019   Type II diabetes mellitus 06/15/2019   Hypokalemia 11/08/2018   CKD (chronic kidney disease) stage 3, GFR 30-59 ml/min 11/08/2018   Orthostatic hypotension 07/28/2017   On home oxygen  therapy    Migraines  Left bundle branch block    Hypothyroidism    Hypertension    Hyperlipidemia    Heart murmur    GERD (gastroesophageal reflux disease)    Exertional shortness of breath    Major depressive disorder    Back pain    Arthritis    Generalized anxiety disorder    Anemia    Chronic respiratory failure 09/14/2013   Biventricular ICD (implantable  cardioverter-defibrillator) in place 08/04/2013   Chronic systolic heart failure 10/27/2012   Endometrial polyp 01/20/2012   Malignant tumor of breast 03/26/2011   Vitamin D  deficiency 03/26/2011    Current Outpatient Medications:    acetaminophen  (TYLENOL ) 500 MG tablet, Take 500 mg by mouth every 6 (six) hours as needed., Disp: , Rfl:    acetaZOLAMIDE  (DIAMOX ) 125 MG tablet, Take 1 tablet (125 mg total) by mouth daily., Disp: 90 tablet, Rfl: 3   albuterol  (VENTOLIN  HFA) 108 (90 Base) MCG/ACT inhaler, INHALE TWO PUFFS BY MOUTH EVERY 4 HOURS AS NEEDED, Disp: , Rfl:    allopurinol  (ZYLOPRIM ) 100 MG tablet, TAKE 1/2 TABLET (50 MG TOTAL) BY MOUTH DAILY., Disp: 45 tablet, Rfl: 1   azelastine  (ASTELIN ) 0.1 % nasal spray, Place 2 sprays into both nostrils 2 (two) times daily. Use in each nostril as directed, Disp: 30 mL, Rfl: 12   buPROPion  (WELLBUTRIN  XL) 150 MG 24 hr tablet, Take 1 tablet (150 mg total) by mouth daily., Disp: 30 tablet, Rfl: 5   carvedilol  (COREG ) 3.125 MG tablet, TAKE 1 TABLET (3.125 MG) BY MOUTH TWICE DAILY WITH MEALS, Disp: 180 tablet, Rfl: 3   citalopram  (CELEXA ) 20 MG tablet, Take 1 tablet (20 mg total) by mouth daily., Disp: 30 tablet, Rfl: 5   clonazePAM  (KLONOPIN ) 0.5 MG tablet, Take 1 tablet (0.5 mg total) by mouth at bedtime., Disp: 30 tablet, Rfl: 5   cyanocobalamin  (VITAMIN B12) 1000 MCG tablet, Take 1,000 mcg by mouth daily., Disp: , Rfl:    desloratadine  (CLARINEX ) 5 MG tablet, TAKE 1 TABLET (5 MG TOTAL) BY MOUTH DAILY FOR ALLERGIES, Disp: 90 tablet, Rfl: 3   dicyclomine  (BENTYL ) 20 MG tablet, Take 20 mg by mouth 3 (three) times daily before meals., Disp: , Rfl:    doxycycline  (VIBRAMYCIN ) 100 MG capsule, Take 1 capsule (100 mg total) by mouth 2 (two) times daily., Disp: 20 capsule, Rfl: 0   famotidine (PEPCID) 20 MG tablet, Take 20 mg by mouth 2 (two) times daily., Disp: , Rfl:    fluticasone  (FLONASE ) 50 MCG/ACT nasal spray, Place 1 spray into both nostrils daily.,  Disp: , Rfl:    gabapentin  (NEURONTIN ) 100 MG capsule, TAKE 1 CAPSULE (100 MG TOTAL) BY MOUTH 2 (TWO) TIMES DAILY., Disp: 180 capsule, Rfl: 0   levothyroxine  (SYNTHROID ) 88 MCG tablet, TAKE 1 TABLET(88 MCG) BY MOUTH DAILY BEFORE BREAKFAST, Disp: 90 tablet, Rfl: 3   memantine  (NAMENDA ) 5 MG tablet, Take 1 tablet by mouth daily. (Patient taking differently: Take 1 tablet by mouth 2 (two) times daily.), Disp: , Rfl:    metolazone  (ZAROXOLYN ) 2.5 MG tablet, TAKE 1 TABLET BY MOUTH ONCE A WEEK, Disp: 4 tablet, Rfl: 1   midodrine  (PROAMATINE ) 5 MG tablet, Take 1 tablet (5 mg total) by mouth 3 (three) times daily., Disp: 200 tablet, Rfl: 3   Multiple Vitamin (MULTIVITAMIN WITH MINERALS) TABS tablet, Take 1 tablet by mouth daily., Disp: , Rfl:    nystatin (MYCOSTATIN) 100000 UNIT/ML suspension, Take 5 mLs by mouth 4 (four) times daily., Disp: , Rfl:  Ondansetron  HCl (ZOFRAN  PO), 4 mg. Take 1 tablet on the tongue and allow to dissolve, Disp: , Rfl:    phenol (CHLORASEPTIC) 1.4 % LIQD, Use as directed 1 spray in the mouth or throat as needed for throat irritation / pain., Disp: , Rfl:    potassium chloride  SA (KLOR-CON  M) 20 MEQ tablet, Take 3 tablets (60 mEq total) by mouth 2 (two) times daily., Disp: 572 tablet, Rfl: 3   risperiDONE  (RISPERDAL ) 0.5 MG tablet, Take 1 tablet (0.5 mg total) by mouth 2 (two) times daily at 8 am and 4 pm., Disp: 60 tablet, Rfl: 5   simvastatin  (ZOCOR ) 10 MG tablet, TAKE 1 TABLET EVERY EVENING, Disp: 90 tablet, Rfl: 3   spironolactone  (ALDACTONE ) 25 MG tablet, TAKE 1 TABLET (25 MG TOTAL) BY MOUTH EVERY EVENING., Disp: 90 tablet, Rfl: 3   tirzepatide  (MOUNJARO ) 10 MG/0.5ML Pen, Inject 10 mg into the skin once a week., Disp: 2 mL, Rfl: 0   torsemide  (DEMADEX ) 100 MG tablet, Take 1 tablet (100 mg total) by mouth 2 (two) times daily., Disp: 180 tablet, Rfl: 3 Allergies  Allergen Reactions   Ceftin Anaphylaxis    Face and throat swell    Cefuroxime Axetil Anaphylaxis    Face and  throat swell   Geodon [Ziprasidone Hcl] Hives   Lisinopril  Other (See Comments)    angioedema  lisinopril    Shellfish-Derived Products Other (See Comments)    Other reaction(s): Other  shellfish derived   Cefuroxime     Other reaction(s): anaphylaxis   Sulfacetamide Sodium-Sulfur      Other reaction(s): itch   Allopurinol  Nausea Only and Other (See Comments)    weakness   Ativan  [Lorazepam ] Itching   Diazepam  Other (See Comments)    Patient states that diazepam  doesn't relax, it has the opposite effect.  Valium    Sulfa Antibiotics Itching     Social History   Socioeconomic History   Marital status: Married    Spouse name: [place]   Number of children: 2   Years of education: 18   Highest education level: Master's degree (e.g., MA, MS, MEng, MEd, MSW, MBA)  Occupational History   Occupation: retired  Tobacco Use   Smoking status: Every Day    Current packs/day: 0.00    Average packs/day: 0.5 packs/day for 26.0 years (13.0 ttl pk-yrs)    Types: Cigarettes    Start date: 03/24/1995    Last attempt to quit: 03/23/2021    Years since quitting: 2.6   Smokeless tobacco: Never  Vaping Use   Vaping status: Never Used  Substance and Sexual Activity   Alcohol use: No   Drug use: No   Sexual activity: Yes  Other Topics Concern   Not on file  Social History Narrative   Tobacco Use Cigarettes: Former Smoker, Quit in 2008   No Alcohol   No recreational drug use   Diet: Regular/Low Carb   Exercise: None   Occupation: disabled   Education: Company secretary, masters   Children: 2   Firearms: No   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   Two story home   Lives with spouse [place]      Social Drivers of Health   Financial Resource Strain: Low Risk  (09/17/2023)   Overall Financial Resource Strain (CARDIA)    Difficulty of Paying Living Expenses: Not very hard  Food Insecurity: No Food Insecurity (11/01/2023)   Hunger Vital Sign    Worried About  Running Out of Food in the Last Year: Never true    Ran Out of Food in the Last Year: Never true  Transportation Needs: No Transportation Needs (11/01/2023)   PRAPARE - Administrator, Civil Service (Medical): No    Lack of Transportation (Non-Medical): No  Physical Activity: Insufficiently Active (09/17/2023)   Exercise Vital Sign    Days of Exercise per Week: 1 day    Minutes of Exercise per Session: 20 min  Stress: Stress Concern Present (09/17/2023)   [name] of Occupational Health - Occupational Stress Questionnaire    Feeling of Stress : To some extent  Social Connections: Socially Integrated (09/17/2023)   Social Connection and Isolation Panel    Frequency of Communication with Friends and Family: Twice a week    Frequency of Social Gatherings with Friends and Family: Twice a week    Attends Religious Services: 1 to 4 times per year    Active Member of Clubs or Organizations: Patient unable to answer    Attends Banker Meetings: 1 to 4 times per year    Marital Status: Married  Catering manager Violence: Not At Risk (11/01/2023)   Humiliation, Afraid, Rape, and Kick questionnaire    Fear of Current or Ex-Partner: No    Emotionally Abused: No    Physically Abused: No    Sexually Abused: No    Physical Exam      Future Appointments  Date Time Provider Department Center  12/13/2023  7:20 AM [facility] DEVICE REMOTES [facility]  12/20/2023  7:25 AM [facility] DEVICE REMOTES [facility]  12/29/2023  3:00 PM Gaynel Delon CROME, DPM [facility]  01/12/2024 10:30 AM Tobie Breslow K, DO [facility] None  03/09/2024  3:20 PM Arfeen, Leni DASEN, MD [facility] None  03/13/2024  7:10 AM [facility] DEVICE REMOTES [facility]  06/12/2024  7:10 AM [facility] DEVICE REMOT[facility]  09/11/2024  7:10 AM [facility] DEVICE REMOTES [facility]  12/11/2024  7:10 AM [facility] DEVICE REMOTES [facility]  03/12/2025  7:10 AM [facility] DEVICE REMOTES [facility]

## 2023-11-29 ENCOUNTER — Other Ambulatory Visit (HOSPITAL_COMMUNITY): Payer: Self-pay | Admitting: Cardiology

## 2023-12-01 ENCOUNTER — Other Ambulatory Visit (HOSPITAL_COMMUNITY): Payer: Self-pay

## 2023-12-01 DIAGNOSIS — Z6838 Body mass index (BMI) 38.0-38.9, adult: Secondary | ICD-10-CM | POA: Diagnosis not present

## 2023-12-01 DIAGNOSIS — Z01419 Encounter for gynecological examination (general) (routine) without abnormal findings: Secondary | ICD-10-CM | POA: Diagnosis not present

## 2023-12-01 DIAGNOSIS — Z1231 Encounter for screening mammogram for malignant neoplasm of breast: Secondary | ICD-10-CM | POA: Diagnosis not present

## 2023-12-01 NOTE — Progress Notes (Signed)
 Paramedicine Encounter    Patient ID: Rhonda [name], female    DOB: 06-05-1950, 73 y.o.   MRN: [id number]   Complaints- back and neck pain   Assessment- CAOX4, warm and dry ambulatory with complaints of neck and back pain. No shortness of breath, no swelling, lungs clear.   Compliance with meds- missed one evening dose   Pill box filled- one week   Refills needed- all [facility]   Meds changes since last visit- none     Social changes- none    VISIT SUMMARY- Arrived for home visit for Rhonda [name] who was seated in her room alert and oriented reporting to be having some neck and back pain- she reports having it last night after sleeping. She is taking tylenol  for same. I obtained vitals and assessment. She is down 5 lbs from last week. No swelling. No shortness of breath. No dizziness or chest pain. Meds reviewed and filled for one week. Refills called into [facility]. Appointments reviewed. She will see OBGYN today. Home visit complete. I will see her in one week.   BP (!) 142/73   Pulse 96   Resp 16   Wt 200 lb 12.8 oz (91.1 kg)   BMI 36.73 kg/m  Weight yesterday--n/a Last visit weight--205lbs      ACTION: Home visit completed     Patient Care Team: Chrystal Lamarr GORMAN, MD as PCP - General (Family Medicine) Waddell Danelle ORN, MD as PCP - Electrophysiology (Cardiology) Cathern Andriette DEL, LCSW as Social Worker (Licensed Clinical Social Worker) Tobie, Tonita POUR, DO as Consulting Physician (Neurology) Jacques Moats, Paramedic as Paramedic Chrystal, Lamarr GORMAN, MD as Attending Physician (Family Medicine) Bensimhon, Toribio SAUNDERS, MD as Consulting Physician (Cardiology) Curry, Leni DASEN, MD as Consulting Physician (Psychiatry) Tobie Franky SQUIBB, DPM as Consulting Physician (Podiatry) Aniceto Daphne CROME, NP as Nurse Practitioner (Cardiology) Gaynel Delon CROME, DPM as Consulting Physician (Podiatry) Slusher, Santana LABOR, RN as Registered Nurse  Patient Active Problem List    Diagnosis Date Noted   Mild dementia 04/20/2023   Diabetic neuropathy    Elevated troponin 12/02/2021   Acute left ankle pain 10/11/2021   Hyponatremia 10/08/2021   Decreased estrogen level 08/01/2021   Hyperparathyroidism 08/01/2021   Spondylolisthesis 08/01/2021   Varicose veins of bilateral lower extremities with other complications 08/01/2021   Lumbago without sciatica 04/30/2021   Abnormal gait 06/11/2020   Allergic rhinitis 06/11/2020   Asthma 06/11/2020   Benign intracranial hypertension 06/11/2020   Body mass index (BMI) 45.0-49.9, adult 06/11/2020   Bowel incontinence 06/11/2020   Cholelithiasis 06/11/2020   Chronic sinusitis 06/11/2020   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   Daytime somnolence 06/11/2020   Edema 06/11/2020   Family history of malignant neoplasm of gastrointestinal tract 06/11/2020   Gout 06/11/2020   Insomnia 06/11/2020   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   Lumbar spondylosis with myelopathy 06/11/2020   Macrocytosis 06/11/2020   History of colonic polyps 06/11/2020   Repeated falls 06/11/2020   Irritable bowel syndrome 01/04/2020   Spinal stenosis of lumbar region 01/03/2020   Bilateral leg weakness 08/01/2019   Type II diabetes mellitus 06/15/2019   Hypokalemia 11/08/2018   CKD (chronic kidney disease) stage 3, GFR 30-59 ml/min 11/08/2018   Orthostatic hypotension 07/28/2017   On home oxygen  therapy    Migraines    Left bundle branch block    Hypothyroidism    Hypertension    Hyperlipidemia    Heart murmur    GERD (gastroesophageal reflux disease)  Exertional shortness of breath    Major depressive disorder    Back pain    Arthritis    Generalized anxiety disorder    Anemia    Chronic respiratory failure 09/14/2013   Biventricular ICD (implantable cardioverter-defibrillator) in place 08/04/2013   Chronic systolic heart failure 10/27/2012   Endometrial polyp 01/20/2012   Malignant tumor of breast 03/26/2011   Vitamin D  deficiency 03/26/2011     Current Outpatient Medications:    acetaminophen  (TYLENOL ) 500 MG tablet, Take 500 mg by mouth every 6 (six) hours as needed., Disp: , Rfl:    acetaZOLAMIDE  (DIAMOX ) 125 MG tablet, Take 1 tablet (125 mg total) by mouth daily., Disp: 90 tablet, Rfl: 3   albuterol  (VENTOLIN  HFA) 108 (90 Base) MCG/ACT inhaler, INHALE TWO PUFFS BY MOUTH EVERY 4 HOURS AS NEEDED, Disp: , Rfl:    allopurinol  (ZYLOPRIM ) 100 MG tablet, TAKE 1/2 TABLET (50 MG TOTAL) BY MOUTH DAILY., Disp: 45 tablet, Rfl: 1   azelastine  (ASTELIN ) 0.1 % nasal spray, Place 2 sprays into both nostrils 2 (two) times daily. Use in each nostril as directed, Disp: 30 mL, Rfl: 12   buPROPion  (WELLBUTRIN  XL) 150 MG 24 hr tablet, Take 1 tablet (150 mg total) by mouth daily., Disp: 30 tablet, Rfl: 5   carvedilol  (COREG ) 3.125 MG tablet, TAKE 1 TABLET (3.125 MG) BY MOUTH TWICE DAILY WITH MEALS, Disp: 180 tablet, Rfl: 3   citalopram  (CELEXA ) 20 MG tablet, Take 1 tablet (20 mg total) by mouth daily., Disp: 30 tablet, Rfl: 5   clonazePAM  (KLONOPIN ) 0.5 MG tablet, Take 1 tablet (0.5 mg total) by mouth at bedtime., Disp: 30 tablet, Rfl: 5   cyanocobalamin  (VITAMIN B12) 1000 MCG tablet, Take 1,000 mcg by mouth daily., Disp: , Rfl:    desloratadine  (CLARINEX ) 5 MG tablet, TAKE 1 TABLET (5 MG TOTAL) BY MOUTH DAILY FOR ALLERGIES, Disp: 90 tablet, Rfl: 3   dicyclomine  (BENTYL ) 20 MG tablet, Take 20 mg by mouth 3 (three) times daily before meals., Disp: , Rfl:    doxycycline  (VIBRAMYCIN ) 100 MG capsule, Take 1 capsule (100 mg total) by mouth 2 (two) times daily., Disp: 20 capsule, Rfl: 0   famotidine (PEPCID) 20 MG tablet, Take 20 mg by mouth 2 (two) times daily., Disp: , Rfl:    fluticasone  (FLONASE ) 50 MCG/ACT nasal spray, Place 1 spray into both nostrils daily., Disp: , Rfl:    gabapentin  (NEURONTIN ) 100 MG capsule, TAKE 1 CAPSULE (100 MG TOTAL) BY MOUTH 2 (TWO) TIMES DAILY., Disp: 180 capsule, Rfl: 0   levothyroxine  (SYNTHROID ) 88 MCG tablet, TAKE 1  TABLET(88 MCG) BY MOUTH DAILY BEFORE BREAKFAST, Disp: 90 tablet, Rfl: 3   memantine  (NAMENDA ) 5 MG tablet, Take 1 tablet by mouth daily. (Patient taking differently: Take 1 tablet by mouth 2 (two) times daily.), Disp: , Rfl:    metolazone  (ZAROXOLYN ) 2.5 MG tablet, TAKE 1 TABLET BY MOUTH ONCE A WEEK, Disp: 4 tablet, Rfl: 1   midodrine  (PROAMATINE ) 5 MG tablet, Take 1 tablet (5 mg total) by mouth 3 (three) times daily., Disp: 200 tablet, Rfl: 3   Multiple Vitamin (MULTIVITAMIN WITH MINERALS) TABS tablet, Take 1 tablet by mouth daily., Disp: , Rfl:    nystatin (MYCOSTATIN) 100000 UNIT/ML suspension, Take 5 mLs by mouth 4 (four) times daily., Disp: , Rfl:    Ondansetron  HCl (ZOFRAN  PO), 4 mg. Take 1 tablet on the tongue and allow to dissolve, Disp: , Rfl:    phenol (CHLORASEPTIC) 1.4 % LIQD, Use as  directed 1 spray in the mouth or throat as needed for throat irritation / pain., Disp: , Rfl:    potassium chloride  SA (KLOR-CON  M) 20 MEQ tablet, Take 3 tablets (60 mEq total) by mouth 2 (two) times daily., Disp: 572 tablet, Rfl: 3   risperiDONE  (RISPERDAL ) 0.5 MG tablet, Take 1 tablet (0.5 mg total) by mouth 2 (two) times daily at 8 am and 4 pm., Disp: 60 tablet, Rfl: 5   simvastatin  (ZOCOR ) 10 MG tablet, TAKE 1 TABLET EVERY EVENING, Disp: 90 tablet, Rfl: 3   spironolactone  (ALDACTONE ) 25 MG tablet, TAKE 1 TABLET (25 MG TOTAL) BY MOUTH EVERY EVENING., Disp: 90 tablet, Rfl: 3   tirzepatide  (MOUNJARO ) 10 MG/0.5ML Pen, Inject 10 mg into the skin once a week., Disp: 2 mL, Rfl: 0   torsemide  (DEMADEX ) 100 MG tablet, Take 1 tablet (100 mg total) by mouth 2 (two) times daily., Disp: 180 tablet, Rfl: 3 Allergies  Allergen Reactions   Ceftin Anaphylaxis    Face and throat swell    Cefuroxime Axetil Anaphylaxis    Face and throat swell   Geodon [Ziprasidone Hcl] Hives   Lisinopril  Other (See Comments)    angioedema  lisinopril    Shellfish-Derived Products Other (See Comments)    Other reaction(s):  Other  shellfish derived   Cefuroxime     Other reaction(s): anaphylaxis   Sulfacetamide Sodium-Sulfur      Other reaction(s): itch   Allopurinol  Nausea Only and Other (See Comments)    weakness   Ativan  [Lorazepam ] Itching   Diazepam  Other (See Comments)    Patient states that diazepam  doesn't relax, it has the opposite effect.  Valium    Sulfa Antibiotics Itching     Social History   Socioeconomic History   Marital status: Married    Spouse name: [place]   Number of children: 2   Years of education: 18   Highest education level: Master's degree (e.g., MA, MS, MEng, MEd, MSW, MBA)  Occupational History   Occupation: retired  Tobacco Use   Smoking status: Every Day    Current packs/day: 0.00    Average packs/day: 0.5 packs/day for 26.0 years (13.0 ttl pk-yrs)    Types: Cigarettes    Start date: 03/24/1995    Last attempt to quit: 03/23/2021    Years since quitting: 2.6   Smokeless tobacco: Never  Vaping Use   Vaping status: Never Used  Substance and Sexual Activity   Alcohol use: No   Drug use: No   Sexual activity: Yes  Other Topics Concern   Not on file  Social History Narrative   Tobacco Use Cigarettes: Former Smoker, Quit in 2008   No Alcohol   No recreational drug use   Diet: Regular/Low Carb   Exercise: None   Occupation: disabled   Education: Company secretary, masters   Children: 2   Firearms: No   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   Two story home   Lives with spouse [place]      Social Drivers of Health   Financial Resource Strain: Low Risk  (09/17/2023)   Overall Financial Resource Strain (CARDIA)    Difficulty of Paying Living Expenses: Not very hard  Food Insecurity: No Food Insecurity (11/01/2023)   Hunger Vital Sign    Worried About Running Out of Food in the Last Year: Never true    Ran Out of Food in the Last Year: Never true  Transportation Needs: No Transportation Needs (  11/01/2023)   PRAPARE - Therapist, art (Medical): No    Lack of Transportation (Non-Medical): No  Physical Activity: Insufficiently Active (09/17/2023)   Exercise Vital Sign    Days of Exercise per Week: 1 day    Minutes of Exercise per Session: 20 min  Stress: Stress Concern Present (09/17/2023)   [name] of Occupational Health - Occupational Stress Questionnaire    Feeling of Stress : To some extent  Social Connections: Socially Integrated (09/17/2023)   Social Connection and Isolation Panel    Frequency of Communication with Friends and Family: Twice a week    Frequency of Social Gatherings with Friends and Family: Twice a week    Attends Religious Services: 1 to 4 times per year    Active Member of Clubs or Organizations: Patient unable to answer    Attends Banker Meetings: 1 to 4 times per year    Marital Status: Married  Catering manager Violence: Not At Risk (11/01/2023)   Humiliation, Afraid, Rape, and Kick questionnaire    Fear of Current or Ex-Partner: No    Emotionally Abused: No    Physically Abused: No    Sexually Abused: No    Physical Exam      Future Appointments  Date Time Provider Department Center  12/13/2023  7:20 AM [facility] DEVICE REMOTES [facility]  12/20/2023  7:25 AM [facility] DEVICE REMOTES [facility]  12/29/2023  3:00 PM Gaynel Delon CROME, DPM [facility]  01/12/2024 10:30 AM Tobie Breslow K, DO [facility] None  03/09/2024  3:20 PM Arfeen, Leni DASEN, MD [facility] None  03/13/2024  7:10 AM [facility] DEVICE REMOTES [facility]  06/12/2024  7:10 AM [facility] DEVICE REMOTES [facility]  09/11/2024  7:10 AM [facility] DEVICE REMOTES [facility]  12/11/2024  7:10 AM [facility] DEVICE REMOTES [facility]  03/12/2025  7:10 AM [facility] DEVICE REMOTES [facility]

## 2023-12-08 ENCOUNTER — Other Ambulatory Visit (HOSPITAL_COMMUNITY): Payer: Self-pay

## 2023-12-08 DIAGNOSIS — E1140 Type 2 diabetes mellitus with diabetic neuropathy, unspecified: Secondary | ICD-10-CM | POA: Diagnosis not present

## 2023-12-08 DIAGNOSIS — N184 Chronic kidney disease, stage 4 (severe): Secondary | ICD-10-CM | POA: Diagnosis not present

## 2023-12-08 DIAGNOSIS — I959 Hypotension, unspecified: Secondary | ICD-10-CM | POA: Diagnosis not present

## 2023-12-08 DIAGNOSIS — N1832 Chronic kidney disease, stage 3b: Secondary | ICD-10-CM | POA: Diagnosis not present

## 2023-12-08 DIAGNOSIS — I509 Heart failure, unspecified: Secondary | ICD-10-CM | POA: Diagnosis not present

## 2023-12-08 DIAGNOSIS — E1122 Type 2 diabetes mellitus with diabetic chronic kidney disease: Secondary | ICD-10-CM | POA: Diagnosis not present

## 2023-12-08 DIAGNOSIS — I5041 Acute combined systolic (congestive) and diastolic (congestive) heart failure: Secondary | ICD-10-CM | POA: Diagnosis not present

## 2023-12-08 DIAGNOSIS — I5032 Chronic diastolic (congestive) heart failure: Secondary | ICD-10-CM | POA: Diagnosis not present

## 2023-12-08 NOTE — Progress Notes (Signed)
 Paramedicine Encounter    Patient ID: Rhonda [name], female    DOB: 1950/12/26, 73 y.o.   MRN: [id number]   Complaints- no complaints   Assessment- CAOX4, warm and dry seated in her bedroom chair- no complaints. Some lower leg swelling noted but no edema, she does have a 5lbs weight gain- lungs clear- vitals within normal limits for her.   Compliance with meds- no missed doses of meds   Pill box filled- one week   Refills needed- none   Meds changes since last visit- none     Social changes- none    VISIT SUMMARY- Arrived for home visit for Rhonda [name] who reports to be feeling well with no complaints today. Assessment and vitals obtained. She has gained 5 lbs in the last week- she admits to eating more salty foods like wonton soup and jimmy deans breakfast bowls. I reminded her and her husband the importance of limiting her sodium intake and reminded her the limits of this and her fluid intake. She verbalized understanding. She has had no shortness of breath or chest pain, no dizziness. No pitting edema noted just some swelling in her lower legs and ankles. I reminded her to use her compression socks and keep her legs elevated also. Her husband was present during conversation and I asked him to assist in helping her manage these things. Both agreed. I reviewed meds and filled pill box for one week. No refills needed. Home visit complete. I plan to see [name] in one week.  BP 124/80   Pulse 87   Resp 18   Wt 205 lb (93 kg)   SpO2 99%   BMI 37.49 kg/m  Weight yesterday-- didn't weigh  Last visit weight-- 200lbs      ACTION: Home visit completed     Patient Care Team: Chrystal Lamarr GORMAN, MD as PCP - General (Family Medicine) Waddell Danelle ORN, MD as PCP - Electrophysiology (Cardiology) Cathern Andriette DEL, LCSW as Social Worker (Licensed Clinical Social Worker) Tobie, Tonita POUR, DO as Consulting Physician (Neurology) Jacques Moats, Paramedic as Paramedic Chrystal, Lamarr GORMAN, MD as Attending Physician (Family Medicine) Bensimhon, Toribio SAUNDERS, MD as Consulting Physician (Cardiology) Curry, Leni DASEN, MD as Consulting Physician (Psychiatry) Tobie Franky SQUIBB, DPM as Consulting Physician (Podiatry) Aniceto Daphne CROME, NP as Nurse Practitioner (Cardiology) Gaynel Delon CROME, DPM as Consulting Physician (Podiatry) Slusher, Santana LABOR, RN as Registered Nurse  Patient Active Problem List   Diagnosis Date Noted   Mild dementia 04/20/2023   Diabetic neuropathy    Elevated troponin 12/02/2021   Acute left ankle pain 10/11/2021   Hyponatremia 10/08/2021   Decreased estrogen level 08/01/2021   Hyperparathyroidism 08/01/2021   Spondylolisthesis 08/01/2021   Varicose veins of bilateral lower extremities with other complications 08/01/2021   Lumbago without sciatica 04/30/2021   Abnormal gait 06/11/2020   Allergic rhinitis 06/11/2020   Asthma 06/11/2020   Benign intracranial hypertension 06/11/2020   Body mass index (BMI) 45.0-49.9, adult 06/11/2020   Bowel incontinence 06/11/2020   Cholelithiasis 06/11/2020   Chronic sinusitis 06/11/2020   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   Daytime somnolence 06/11/2020   Edema 06/11/2020   Family history of malignant neoplasm of gastrointestinal tract 06/11/2020   Gout 06/11/2020   Insomnia 06/11/2020   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   Lumbar spondylosis with myelopathy 06/11/2020   Macrocytosis 06/11/2020   History of colonic polyps 06/11/2020   Repeated falls 06/11/2020   Irritable bowel syndrome 01/04/2020   Spinal stenosis of lumbar region 01/03/2020  Bilateral leg weakness 08/01/2019   Type II diabetes mellitus 06/15/2019   Hypokalemia 11/08/2018   CKD (chronic kidney disease) stage 3, GFR 30-59 ml/min 11/08/2018   Orthostatic hypotension 07/28/2017   On home oxygen  therapy    Migraines    Left bundle branch block    Hypothyroidism    Hypertension    Hyperlipidemia    Heart murmur    GERD (gastroesophageal reflux disease)     Exertional shortness of breath    Major depressive disorder    Back pain    Arthritis    Generalized anxiety disorder    Anemia    Chronic respiratory failure 09/14/2013   Biventricular ICD (implantable cardioverter-defibrillator) in place 08/04/2013   Chronic systolic heart failure 10/27/2012   Endometrial polyp 01/20/2012   Malignant tumor of breast 03/26/2011   Vitamin D  deficiency 03/26/2011    Current Outpatient Medications:    acetaminophen  (TYLENOL ) 500 MG tablet, Take 500 mg by mouth every 6 (six) hours as needed., Disp: , Rfl:    acetaZOLAMIDE  (DIAMOX ) 125 MG tablet, Take 1 tablet (125 mg total) by mouth daily., Disp: 90 tablet, Rfl: 3   albuterol  (VENTOLIN  HFA) 108 (90 Base) MCG/ACT inhaler, INHALE TWO PUFFS BY MOUTH EVERY 4 HOURS AS NEEDED, Disp: , Rfl:    allopurinol  (ZYLOPRIM ) 100 MG tablet, TAKE 1/2 TABLET (50 MG TOTAL) BY MOUTH DAILY., Disp: 45 tablet, Rfl: 1   azelastine  (ASTELIN ) 0.1 % nasal spray, Place 2 sprays into both nostrils 2 (two) times daily. Use in each nostril as directed, Disp: 30 mL, Rfl: 12   buPROPion  (WELLBUTRIN  XL) 150 MG 24 hr tablet, Take 1 tablet (150 mg total) by mouth daily., Disp: 30 tablet, Rfl: 5   carvedilol  (COREG ) 3.125 MG tablet, TAKE 1 TABLET (3.125 MG) BY MOUTH TWICE DAILY WITH MEALS, Disp: 180 tablet, Rfl: 3   citalopram  (CELEXA ) 20 MG tablet, Take 1 tablet (20 mg total) by mouth daily., Disp: 30 tablet, Rfl: 5   clonazePAM  (KLONOPIN ) 0.5 MG tablet, Take 1 tablet (0.5 mg total) by mouth at bedtime., Disp: 30 tablet, Rfl: 5   cyanocobalamin  (VITAMIN B12) 1000 MCG tablet, Take 1,000 mcg by mouth daily., Disp: , Rfl:    desloratadine  (CLARINEX ) 5 MG tablet, TAKE 1 TABLET (5 MG TOTAL) BY MOUTH DAILY FOR ALLERGIES, Disp: 90 tablet, Rfl: 3   dicyclomine  (BENTYL ) 20 MG tablet, Take 20 mg by mouth 3 (three) times daily before meals., Disp: , Rfl:    doxycycline  (VIBRAMYCIN ) 100 MG capsule, Take 1 capsule (100 mg total) by mouth 2 (two) times  daily., Disp: 20 capsule, Rfl: 0   famotidine (PEPCID) 20 MG tablet, Take 20 mg by mouth 2 (two) times daily., Disp: , Rfl:    fluticasone  (FLONASE ) 50 MCG/ACT nasal spray, Place 1 spray into both nostrils daily., Disp: , Rfl:    gabapentin  (NEURONTIN ) 100 MG capsule, TAKE 1 CAPSULE (100 MG TOTAL) BY MOUTH 2 (TWO) TIMES DAILY., Disp: 180 capsule, Rfl: 0   levothyroxine  (SYNTHROID ) 88 MCG tablet, TAKE 1 TABLET(88 MCG) BY MOUTH DAILY BEFORE BREAKFAST, Disp: 90 tablet, Rfl: 3   memantine  (NAMENDA ) 5 MG tablet, Take 1 tablet by mouth daily. (Patient taking differently: Take 1 tablet by mouth 2 (two) times daily.), Disp: , Rfl:    metolazone  (ZAROXOLYN ) 2.5 MG tablet, TAKE 1 TABLET BY MOUTH ONCE A WEEK, Disp: 4 tablet, Rfl: 1   midodrine  (PROAMATINE ) 5 MG tablet, Take 1 tablet (5 mg total) by mouth 3 (three) times  daily., Disp: 200 tablet, Rfl: 3   Multiple Vitamin (MULTIVITAMIN WITH MINERALS) TABS tablet, Take 1 tablet by mouth daily., Disp: , Rfl:    nystatin (MYCOSTATIN) 100000 UNIT/ML suspension, Take 5 mLs by mouth 4 (four) times daily., Disp: , Rfl:    Ondansetron  HCl (ZOFRAN  PO), 4 mg. Take 1 tablet on the tongue and allow to dissolve, Disp: , Rfl:    phenol (CHLORASEPTIC) 1.4 % LIQD, Use as directed 1 spray in the mouth or throat as needed for throat irritation / pain., Disp: , Rfl:    potassium chloride  SA (KLOR-CON  M) 20 MEQ tablet, Take 3 tablets (60 mEq total) by mouth 2 (two) times daily., Disp: 572 tablet, Rfl: 3   risperiDONE  (RISPERDAL ) 0.5 MG tablet, Take 1 tablet (0.5 mg total) by mouth 2 (two) times daily at 8 am and 4 pm., Disp: 60 tablet, Rfl: 5   simvastatin  (ZOCOR ) 10 MG tablet, TAKE 1 TABLET EVERY EVENING, Disp: 90 tablet, Rfl: 3   spironolactone  (ALDACTONE ) 25 MG tablet, TAKE 1 TABLET (25 MG TOTAL) BY MOUTH EVERY EVENING., Disp: 90 tablet, Rfl: 3   tirzepatide  (MOUNJARO ) 10 MG/0.5ML Pen, Inject 10 mg into the skin once a week., Disp: 2 mL, Rfl: 0   torsemide  (DEMADEX ) 100 MG  tablet, Take 1 tablet (100 mg total) by mouth 2 (two) times daily., Disp: 180 tablet, Rfl: 3 Allergies  Allergen Reactions   Ceftin Anaphylaxis    Face and throat swell    Cefuroxime Axetil Anaphylaxis    Face and throat swell   Geodon [Ziprasidone Hcl] Hives   Lisinopril  Other (See Comments)    angioedema  lisinopril    Shellfish-Derived Products Other (See Comments)    Other reaction(s): Other  shellfish derived   Cefuroxime     Other reaction(s): anaphylaxis   Sulfacetamide Sodium-Sulfur      Other reaction(s): itch   Allopurinol  Nausea Only and Other (See Comments)    weakness   Ativan  [Lorazepam ] Itching   Diazepam  Other (See Comments)    Patient states that diazepam  doesn't relax, it has the opposite effect.  Valium    Sulfa Antibiotics Itching     Social History   Socioeconomic History   Marital status: Married    Spouse name: Dallas   Number of children: 2   Years of education: 18   Highest education level: Master's degree (e.g., MA, MS, MEng, MEd, MSW, MBA)  Occupational History   Occupation: retired  Tobacco Use   Smoking status: Every Day    Current packs/day: 0.00    Average packs/day: 0.5 packs/day for 26.0 years (13.0 ttl pk-yrs)    Types: Cigarettes    Start date: 03/24/1995    Last attempt to quit: 03/23/2021    Years since quitting: 2.7   Smokeless tobacco: Never  Vaping Use   Vaping status: Never Used  Substance and Sexual Activity   Alcohol use: No   Drug use: No   Sexual activity: Yes  Other Topics Concern   Not on file  Social History Narrative   Tobacco Use Cigarettes: Former Smoker, Quit in 2008   No Alcohol   No recreational drug use   Diet: Regular/Low Carb   Exercise: None   Occupation: disabled   Education: Company secretary, masters   Children: 2   Firearms: No   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   Two story home   Lives with spouse Dallas      Social  Drivers of Health   Financial Resource  Strain: Low Risk  (09/17/2023)   Overall Financial Resource Strain (CARDIA)    Difficulty of Paying Living Expenses: Not very hard  Food Insecurity: No Food Insecurity (11/01/2023)   Hunger Vital Sign    Worried About Running Out of Food in the Last Year: Never true    Ran Out of Food in the Last Year: Never true  Transportation Needs: No Transportation Needs (11/01/2023)   PRAPARE - Administrator, Civil Service (Medical): No    Lack of Transportation (Non-Medical): No  Physical Activity: Insufficiently Active (09/17/2023)   Exercise Vital Sign    Days of Exercise per Week: 1 day    Minutes of Exercise per Session: 20 min  Stress: Stress Concern Present (09/17/2023)   [name] of Occupational Health - Occupational Stress Questionnaire    Feeling of Stress : To some extent  Social Connections: Socially Integrated (09/17/2023)   Social Connection and Isolation Panel    Frequency of Communication with Friends and Family: Twice a week    Frequency of Social Gatherings with Friends and Family: Twice a week    Attends Religious Services: 1 to 4 times per year    Active Member of Clubs or Organizations: Patient unable to answer    Attends Banker Meetings: 1 to 4 times per year    Marital Status: Married  Catering manager Violence: Not At Risk (11/01/2023)   Humiliation, Afraid, Rape, and Kick questionnaire    Fear of Current or Ex-Partner: No    Emotionally Abused: No    Physically Abused: No    Sexually Abused: No    Physical Exam      Future Appointments  Date Time Provider Department Center  12/10/2023  9:00 AM Slusher, Santana LABOR, RN [facility] None  12/13/2023  7:20 AM [facility] DEVICE REMOTES [facility]  12/20/2023  7:25 AM [facility] DEVICE REMOTES [facility]  12/29/2023  3:00 PM Gaynel Delon CROME, DPM [facility]  01/12/2024 10:30 AM Tobie Breslow K, DO [facility] None  03/09/2024  3:20 PM Arfeen, Leni DASEN, MD [facility] None  03/13/2024  7:10 AM  [facility] DEVICE REMOTES [facility]  06/12/2024  7:10 AM [facility] DEVICE REMOTES [facility]  09/11/2024  7:10 AM [facility] DEVICE REMOTES [facility]  12/11/2024  7:10 AM [facility] H[facility] DEVICE REMOTES [facility]  03/12/2025  7:10 AM [facility] H[facility] DEVICE REMOTES [facility]

## 2023-12-10 ENCOUNTER — Other Ambulatory Visit: Payer: Self-pay

## 2023-12-10 NOTE — Patient Instructions (Signed)
 Visit Information  Thank you for taking time to visit with me today. Please don't hesitate to contact me if I can be of assistance to you before our next scheduled appointment.  Your next care management appointment is by telephone on Friday, August 15th at 9:30am.  Please call the care guide team at [phone] if you need to cancel, schedule, or reschedule an appointment.   A reminder to ALL patients/family/friends, please call the [place]  National Suicide Prevention Lifeline: 779-775-7888 or TTY: 7875901204 TTY [phone]) to talk to a trained counselor if you are experiencing a Mental Health or Behavioral Health Crisis or need someone to talk to.  Santana Stamp BSN, CCM [place]  VBCI Population Health RN Care Manager Direct Dial: [phone]  Fax: [phone]

## 2023-12-10 NOTE — Patient Outreach (Signed)
 Complex Care Management   Visit Note  12/10/2023  Name:  Rhonda [name] MRN: [id number] DOB: 1950-11-24  Situation: Referral received for Complex Care Management related to Fall Risk I obtained verbal consent from Caregiverand patient.  Visit completed with Rhonda [name] and Rhonda [name]  on the phone. Main concern today: saw Nephrologist, Dr. Marlee, at [facility], states patient has kidney disease, stage 3, heading to stage 4.  Patient and husband will attend follow up on 04/05/2024 for Kidney Disease Program that will involve education on diet/meds/how to take care of kidneys.    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/2013   Body mass index (BMI) 45.0-49.9, adult 06/11/2020   Bowel incontinence 06/11/2020   Cardiomyopathy    Cholelithiasis 06/11/2020   Chronic respiratory failure 09/14/2013   Chronic sinusitis 06/11/2020   Chronic systolic heart failure 10/27/2012   a) NICM b) ECHO (03/2013) EF 20-25% c) ECHO (09/2013) EF 45-50%, grade I DD   CKD (chronic kidney disease) stage 3, GFR 30-59 ml/min 11/08/2018   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   Complication of anesthesia    History of low blood pressure after surgery; attributed to lying flat   Daytime somnolence 06/11/2020   Decreased estrogen level 08/01/2021   Diabetic neuropathy    Diarrhea of presumed infectious origin 06/11/2020   Edema 06/11/2020   Elevated troponin 12/02/2021   Endometrial polyp 01/20/2012   Exertional shortness of breath    Generalized anxiety disorder    GERD (gastroesophageal  reflux disease)    Gout 06/11/2020   Heart murmur    History of colonic polyps 06/11/2020   History of fall 06/11/2020   Hyperlipidemia    Hyperparathyroidism 08/01/2021   Hypertension    Hypokalemia 11/08/2018   Hyponatremia 10/08/2021   Hypothyroidism    Insomnia 06/11/2020   Intractable nausea and vomiting 10/08/2021   Irritable bowel syndrome 01/04/2020   Left bundle branch block    s/p CRT-D (04/2013)   Lumbago without sciatica 04/30/2021   Lumbar spondylosis with myelopathy 06/11/2020   Macrocytosis 06/11/2020   Major depressive disorder    Malignant tumor of breast 03/26/2011   Left; completed chemotherapy and radiation treatments   Migraines    Mild dementia 04/20/2023   On home oxygen  therapy    2L suppose to be q night (05/03/2013)   Orthostatic hypotension 07/28/2017   Spinal stenosis of lumbar region 01/03/2020   Spondylolisthesis 08/01/2021   SVD (spontaneous vaginal delivery)    x 2   Type II diabetes mellitus 06/15/2019   Varicose veins of bilateral lower extremities with other complications 08/01/2021   Vitamin D  deficiency 03/26/2011    Assessment: Patient Reported Symptoms:  Cognitive Cognitive Status: Able to follow simple commands, Requires Assistance Decision Making, Struggling with memory recall      Neurological Neurological Review of Symptoms: No symptoms reported    HEENT HEENT Symptoms Reported: Not assessed      Cardiovascular Cardiovascular Symptoms Reported: Other: Other Cardiovascular Symptoms: Reports some swelling in ankles but at baseline, not worse than  other days. Cardiovascular Self-Management Outcome: 4 (good)  Respiratory Respiratory Symptoms Reported: No symptoms reported    Endocrine Endocrine Symptoms Reported: No symptoms reported (Last fasting glucose was 98 on 10/25/23, A1C 7.0% on 10/09/2021.) Is patient diabetic?: Yes Endocrine Self-Management Outcome: 4 (good) Endocrine Comment: Takes Mounjaro  10mg , will be titrating  up to 12mg .  Gastrointestinal Gastrointestinal Symptoms Reported: Constipation Additional Gastrointestinal Details: Takes Miralax  to relieve.      Genitourinary Genitourinary Symptoms Reported: No symptoms reported    Integumentary Integumentary Symptoms Reported: No symptoms reported    Musculoskeletal Additional Musculoskeletal Details: Uses a cane   Falls in the past year?: No    Psychosocial Psychosocial Symptoms Reported: Not assessed            09/17/2023    4:43 PM  Depression screen PHQ 2/9  Decreased Interest 0  Down, Depressed, Hopeless 1  PHQ - 2 Score 1    There were no vitals filed for this visit.  Medications Reviewed Today     Reviewed by Lucian Santana LABOR, RN (Registered Nurse) on 12/10/23 at 0915  Med List Status: <None>   Medication Order Taking? Sig Documenting Provider Last Dose Status Informant  acetaminophen  (TYLENOL ) 500 MG tablet [id number]  Take 500 mg by mouth every 6 (six) hours as needed. [provider]  Active   acetaZOLAMIDE  (DIAMOX ) 125 MG tablet [id number]  Take 1 tablet (125 mg total) by mouth daily. Patel, Donika K, DO  Active Self, Pharmacy Records  albuterol  (VENTOLIN  HFA) 108 [phone] Base) MCG/ACT inhaler [id number]  INHALE TWO PUFFS BY MOUTH EVERY 4 HOURS AS NEEDED [provider]  Active   allopurinol  (ZYLOPRIM ) 100 MG tablet [id number]  TAKE 1/2 TABLET (50 MG TOTAL) BY MOUTH DAILY. Bensimhon, Toribio SAUNDERS, MD  Active   azelastine  (ASTELIN ) 0.1 % nasal spray [id number]  Place 2 sprays into both nostrils 2 (two) times daily. Use in each nostril as directed Okey Burns, MD  Active   buPROPion  (WELLBUTRIN  XL) 150 MG 24 hr tablet [id number]  Take 1 tablet (150 mg total) by mouth daily. Arfeen, Syed T, MD  Active   carvedilol  (COREG ) 3.125 MG tablet [id number]  TAKE 1 TABLET (3.125 MG) BY MOUTH TWICE DAILY WITH MEALS Colletta Manuelita Garre, PA-C  Active   citalopram  (CELEXA ) 20 MG tablet [id number]  Take 1 tablet (20 mg total) by mouth  daily. Arfeen, Syed T, MD  Active   clonazePAM  (KLONOPIN ) 0.5 MG tablet [id number]  Take 1 tablet (0.5 mg total) by mouth at bedtime. Arfeen, Syed T, MD  Active   cyanocobalamin  (VITAMIN B12) 1000 MCG tablet [id number]  Take 1,000 mcg by mouth daily. [provider]  Active Self, Pharmacy Records  desloratadine  (CLARINEX ) 5 MG tablet [id number]  TAKE 1 TABLET (5 MG TOTAL) BY MOUTH DAILY FOR ALLERGIES Soldatova, [place], MD  Active   dicyclomine  (BENTYL ) 20 MG tablet [id number]  Take 20 mg by mouth 3 (three) times daily before meals. [provider]  Active Self, Pharmacy Records  doxycycline  (VIBRAMYCIN ) 100 MG capsule [id number]  Take 1 capsule (100 mg total) by mouth 2 (two) times daily. Horton, Charmaine FALCON, MD  Active   famotidine (PEPCID) 20 MG tablet [id number]  Take 20 mg by mouth 2 (two) times daily. [provider]  Active            Med Note GAROLD, JENNA B   Wed Jul 14, 2023 10:08 AM) As needed   fluticasone  (FLONASE ) 50 MCG/ACT nasal spray [id number]  Place 1 spray into both nostrils daily. [provider]  Active Self, Pharmacy Records  gabapentin  (NEURONTIN ) 100 MG capsule [id number]  TAKE 1 CAPSULE (100 MG TOTAL) BY MOUTH 2 (TWO) TIMES DAILY. Patel, Donika K, DO  Active   levothyroxine  (SYNTHROID ) 88 MCG tablet [id number]  TAKE 1 TABLET(88 MCG) BY MOUTH DAILY BEFORE OFILIA Trixie File, MD  Active Self, Pharmacy Records  memantine  (NAMENDA ) 5 MG tablet [id number]  Take 1 tablet by mouth daily.  Patient taking differently: Take 1 tablet by mouth 2 (two) times daily.   [provider]  Active   metolazone  (ZAROXOLYN ) 2.5 MG tablet [id number]  TAKE 1 TABLET BY MOUTH ONCE A WEEK Bensimhon, Toribio SAUNDERS, MD  Active   midodrine  (PROAMATINE ) 5 MG tablet [id number]  Take 1 tablet (5 mg total) by mouth 3 (three) times daily. Marcine Catalan M, PA-C  Active   Multiple Vitamin (MULTIVITAMIN WITH MINERALS) TABS tablet [id number]  Take 1 tablet by mouth daily.  [provider]  Active Self, Pharmacy Records  nystatin (MYCOSTATIN) 100000 UNIT/ML suspension [id number]  Take 5 mLs by mouth 4 (four) times daily. [provider]  Active   Ondansetron  HCl (ZOFRAN  PO) [id number]  4 mg. Take 1 tablet on the tongue and allow to dissolve [provider]  Active            Med Note JOY, [facility] K   Mon Jan 04, 2023 12:24 PM) Taking PRN   phenol (CHLORASEPTIC) 1.4 % LIQD [id number]  Use as directed 1 spray in the mouth or throat as needed for throat irritation / pain. [provider]  Active   potassium chloride  SA (KLOR-CON  M) 20 MEQ tablet [id number]  Take 3 tablets (60 mEq total) by mouth 2 (two) times daily. Sabharwal, Aditya, DO  Active   risperiDONE  (RISPERDAL ) 0.5 MG tablet [id number]  Take 1 tablet (0.5 mg total) by mouth 2 (two) times daily at 8 am and 4 pm. Curry Leni DASEN, MD  Active   simvastatin  (ZOCOR ) 10 MG tablet [id number]  TAKE 1 TABLET EVERY EVENING Bensimhon, Toribio SAUNDERS, MD  Active   spironolactone  (ALDACTONE ) 25 MG tablet [id number]  TAKE 1 TABLET (25 MG TOTAL) BY MOUTH EVERY EVENING. Bensimhon, Toribio SAUNDERS, MD  Active   tirzepatide  (MOUNJARO ) 10 MG/0.5ML Pen [id number] Yes Inject 10 mg into the skin once a week. Bensimhon, Toribio SAUNDERS, MD  Active   torsemide  (DEMADEX ) 100 MG tablet [id number]  Take 1 tablet (100 mg total) by mouth 2 (two) times daily. Glena Harlene HERO, [place]  Active             Recommendation:   Specialty provider follow-up :Dr. Lorence  at [facility] on 04/05/2024,  -Continues to have paramedic from Cardio office come by the house weekly for med box fill, med reconciliation.   Follow Up Plan:   Telephone follow-up in 1 month  Santana Stamp BSN, CCM [place]  VBCI Population Health RN Care Manager Direct Dial: [phone]  Fax: [phone]

## 2023-12-13 ENCOUNTER — Other Ambulatory Visit: Payer: Self-pay | Admitting: Internal Medicine

## 2023-12-13 ENCOUNTER — Ambulatory Visit: Payer: Medicare HMO

## 2023-12-13 DIAGNOSIS — I428 Other cardiomyopathies: Secondary | ICD-10-CM | POA: Diagnosis not present

## 2023-12-14 ENCOUNTER — Ambulatory Visit: Attending: Internal Medicine

## 2023-12-14 DIAGNOSIS — Z95810 Presence of automatic (implantable) cardiac defibrillator: Secondary | ICD-10-CM | POA: Diagnosis not present

## 2023-12-14 DIAGNOSIS — I5022 Chronic systolic (congestive) heart failure: Secondary | ICD-10-CM

## 2023-12-14 LAB — CUP PACEART REMOTE DEVICE CHECK
Battery Remaining Longevity: 36 mo
Battery Voltage: 2.96 V
Brady Statistic AP VP Percent: 0.08 %
Brady Statistic AP VS Percent: 0.02 %
Brady Statistic AS VP Percent: 99.85 %
Brady Statistic AS VS Percent: 0.05 %
Brady Statistic RA Percent Paced: 0.1 %
Brady Statistic RV Percent Paced: 99.9 %
Date Time Interrogation Session: 20250721022723
HighPow Impedance: 86 Ohm
Implantable Lead Connection Status: 753985
Implantable Lead Connection Status: 753985
Implantable Lead Connection Status: 753985
Implantable Lead Implant Date: 20141210
Implantable Lead Implant Date: 20141210
Implantable Lead Implant Date: 20141210
Implantable Lead Location: 753858
Implantable Lead Location: 753859
Implantable Lead Location: 753860
Implantable Lead Model: 4396
Implantable Lead Model: 5076
Implantable Lead Model: 6935
Implantable Pulse Generator Implant Date: 20211118
Lead Channel Impedance Value: 418 Ohm
Lead Channel Impedance Value: 456 Ohm
Lead Channel Impedance Value: 513 Ohm
Lead Channel Impedance Value: 589 Ohm
Lead Channel Impedance Value: 703 Ohm
Lead Channel Impedance Value: 817 Ohm
Lead Channel Pacing Threshold Amplitude: 0.625 V
Lead Channel Pacing Threshold Amplitude: 0.625 V
Lead Channel Pacing Threshold Amplitude: 1.125 V
Lead Channel Pacing Threshold Pulse Width: 0.4 ms
Lead Channel Pacing Threshold Pulse Width: 0.4 ms
Lead Channel Pacing Threshold Pulse Width: 0.4 ms
Lead Channel Sensing Intrinsic Amplitude: 3.5 mV
Lead Channel Sensing Intrinsic Amplitude: 3.5 mV
Lead Channel Sensing Intrinsic Amplitude: 30.375 mV
Lead Channel Sensing Intrinsic Amplitude: 30.375 mV
Lead Channel Setting Pacing Amplitude: 2 V
Lead Channel Setting Pacing Amplitude: 2.25 V
Lead Channel Setting Pacing Amplitude: 2.25 V
Lead Channel Setting Pacing Pulse Width: 0.4 ms
Lead Channel Setting Pacing Pulse Width: 0.4 ms
Lead Channel Setting Sensing Sensitivity: 0.3 mV
Zone Setting Status: 755011
Zone Setting Status: 755011

## 2023-12-15 ENCOUNTER — Other Ambulatory Visit (HOSPITAL_COMMUNITY): Payer: Self-pay

## 2023-12-15 ENCOUNTER — Telehealth (HOSPITAL_COMMUNITY): Payer: Self-pay

## 2023-12-15 ENCOUNTER — Ambulatory Visit: Payer: Self-pay | Admitting: Internal Medicine

## 2023-12-15 DIAGNOSIS — E1121 Type 2 diabetes mellitus with diabetic nephropathy: Secondary | ICD-10-CM

## 2023-12-15 MED ORDER — MOUNJARO 12.5 MG/0.5ML ~~LOC~~ SOAJ
12.5000 mg | SUBCUTANEOUS | 0 refills | Status: DC
Start: 2023-12-15 — End: 2024-03-16

## 2023-12-15 NOTE — Progress Notes (Signed)
 Paramedicine Encounter    Patient ID: Rhonda [name], female    DOB: [birth year], 73 y.o.   MRN: [id number]   Complaints- general fatigue- intermittent headaches   Assessment- CAOX4, warm and dry seated in her chair in her bed room, no shortness of breath, no chest pain, no dizziness. Lower leg swelling noted, 1lbs weight gain from last week.   Compliance with meds- missed one lunch time dose- missed last weeks monjaro   Pill box filled- for one week   Refills needed-  Monjaro Risperidone  Midodrine  Clonazepam  Memantine    Meds changes since last visit- none     Social changes- none    VISIT SUMMARY- Arrived for home visit for Rhonda [name] who was seated in her chair CAOX4, warm and dry. She denied shortness of breath, dizziness or chest pain. She does have some lower leg swelling. Only 1 lbs weight gain in one week. I called device clinic and talked to [name] and reviewed device findings and she reports no fluid build up- I encouraged Rhonda [name] to be wearing her compression socks and elevating her feet as often as she can because she sits so often. She agreed. I also talked to her husband and her about her visit with Dr. Marlee at [facility] and I will be bringing out some educational pieces on diet and kidney health. They were appreciative of same. Vitals as noted. I reviewed meds and filled pill box. I will see Rhonda [name] in one week.    BP 128/70   Pulse 84   Resp 16   Wt 206 lb 4.8 oz (93.6 kg)   SpO2 99%   BMI 37.73 kg/m  Weight yesterday-- didn't weigh  Last visit weight-- 205lbs      ACTION: Home visit completed      Patient Care Team: Chrystal Lamarr GORMAN, MD as PCP - General (Family Medicine) Waddell Danelle ORN, MD as PCP - Electrophysiology (Cardiology) Cathern Andriette DEL, LCSW as Social Worker (Licensed Clinical Social Worker) Tobie, Tonita POUR, DO as Consulting Physician (Neurology) Jacques Moats, Paramedic as Paramedic Chrystal, Lamarr GORMAN, MD as  Attending Physician (Family Medicine) Bensimhon, Toribio SAUNDERS, MD as Consulting Physician (Cardiology) Curry, Leni DASEN, MD as Consulting Physician (Psychiatry) Tobie Franky SQUIBB, DPM as Consulting Physician (Podiatry) Aniceto Daphne CROME, NP as Nurse Practitioner (Cardiology) Gaynel Delon CROME, DPM as Consulting Physician (Podiatry) Slusher, Santana LABOR, RN as Registered Nurse  Patient Active Problem List   Diagnosis Date Noted   Mild dementia 04/20/2023   Diabetic neuropathy    Elevated troponin 12/02/2021   Acute left ankle pain 10/11/2021   Hyponatremia 10/08/2021   Decreased estrogen level 08/01/2021   Hyperparathyroidism 08/01/2021   Spondylolisthesis 08/01/2021   Varicose veins of bilateral lower extremities with other complications 08/01/2021   Lumbago without sciatica 04/30/2021   Abnormal gait 06/11/2020   Allergic rhinitis 06/11/2020   Asthma 06/11/2020   Benign intracranial hypertension 06/11/2020   Body mass index (BMI) 45.0-49.9, adult 06/11/2020   Bowel incontinence 06/11/2020   Cholelithiasis 06/11/2020   Chronic sinusitis 06/11/2020   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   Daytime somnolence 06/11/2020   Edema 06/11/2020   Family history of malignant neoplasm of gastrointestinal tract 06/11/2020   Gout 06/11/2020   Insomnia 06/11/2020   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   Lumbar spondylosis with myelopathy 06/11/2020   Macrocytosis 06/11/2020   History of colonic polyps 06/11/2020   Repeated falls 06/11/2020   Irritable bowel syndrome 01/04/2020   Spinal stenosis of lumbar region 01/03/2020  Bilateral leg weakness 08/01/2019   Type II diabetes mellitus 06/15/2019   Hypokalemia 11/08/2018   CKD (chronic kidney disease) stage 3, GFR 30-59 ml/min 11/08/2018   Orthostatic hypotension 07/28/2017   On home oxygen  therapy    Migraines    Left bundle branch block    Hypothyroidism    Hypertension    Hyperlipidemia    Heart murmur    GERD (gastroesophageal reflux disease)    Exertional  shortness of breath    Major depressive disorder    Back pain    Arthritis    Generalized anxiety disorder    Anemia    Chronic respiratory failure 09/14/2013   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   Chronic systolic heart failure 10/27/2012   Endometrial polyp 01/20/2012   Malignant tumor of breast 03/26/2011   Vitamin D  deficiency 03/26/2011    Current Outpatient Medications:    acetaminophen  (TYLENOL ) 500 MG tablet, Take 500 mg by mouth every 6 (six) hours as needed., Disp: , Rfl:    acetaZOLAMIDE  (DIAMOX ) 125 MG tablet, Take 1 tablet (125 mg total) by mouth daily., Disp: 90 tablet, Rfl: 3   albuterol  (VENTOLIN  HFA) 108 (90 Base) MCG/ACT inhaler, INHALE TWO PUFFS BY MOUTH EVERY 4 HOURS AS NEEDED, Disp: , Rfl:    allopurinol  (ZYLOPRIM ) 100 MG tablet, TAKE 1/2 TABLET (50 MG TOTAL) BY MOUTH DAILY., Disp: 45 tablet, Rfl: 1   azelastine  (ASTELIN ) 0.1 % nasal spray, Place 2 sprays into both nostrils 2 (two) times daily. Use in each nostril as directed, Disp: 30 mL, Rfl: 12   buPROPion  (WELLBUTRIN  XL) 150 MG 24 hr tablet, Take 1 tablet (150 mg total) by mouth daily., Disp: 30 tablet, Rfl: 5   carvedilol  (COREG ) 3.125 MG tablet, TAKE 1 TABLET (3.125 MG) BY MOUTH TWICE DAILY WITH MEALS, Disp: 180 tablet, Rfl: 3   citalopram  (CELEXA ) 20 MG tablet, Take 1 tablet (20 mg total) by mouth daily., Disp: 30 tablet, Rfl: 5   clonazePAM  (KLONOPIN ) 0.5 MG tablet, Take 1 tablet (0.5 mg total) by mouth at bedtime., Disp: 30 tablet, Rfl: 5   cyanocobalamin  (VITAMIN B12) 1000 MCG tablet, Take 1,000 mcg by mouth daily., Disp: , Rfl:    desloratadine  (CLARINEX ) 5 MG tablet, TAKE 1 TABLET (5 MG TOTAL) BY MOUTH DAILY FOR ALLERGIES, Disp: 90 tablet, Rfl: 3   dicyclomine  (BENTYL ) 20 MG tablet, Take 20 mg by mouth 3 (three) times daily before meals., Disp: , Rfl:    doxycycline  (VIBRAMYCIN ) 100 MG capsule, Take 1 capsule (100 mg total) by mouth 2 (two) times daily., Disp: 20  capsule, Rfl: 0   famotidine (PEPCID) 20 MG tablet, Take 20 mg by mouth 2 (two) times daily., Disp: , Rfl:    fluticasone  (FLONASE ) 50 MCG/ACT nasal spray, Place 1 spray into both nostrils daily., Disp: , Rfl:    gabapentin  (NEURONTIN ) 100 MG capsule, TAKE 1 CAPSULE (100 MG TOTAL) BY MOUTH 2 (TWO) TIMES DAILY., Disp: 180 capsule, Rfl: 0   levothyroxine  (SYNTHROID ) 88 MCG tablet, TAKE 1 TABLET(88 MCG) BY MOUTH DAILY BEFORE BREAKFAST, Disp: 90 tablet, Rfl: 3   memantine  (NAMENDA ) 5 MG tablet, Take 1 tablet by mouth daily. (Patient taking differently: Take 1 tablet by mouth 2 (two) times daily.), Disp: , Rfl:    metolazone  (ZAROXOLYN ) 2.5 MG tablet, TAKE 1 TABLET BY MOUTH ONCE A WEEK, Disp: 4 tablet, Rfl: 1   midodrine  (PROAMATINE ) 5 MG tablet, Take 1 tablet (5 mg total) by mouth 3 (three) times  daily., Disp: 200 tablet, Rfl: 3   Multiple Vitamin (MULTIVITAMIN WITH MINERALS) TABS tablet, Take 1 tablet by mouth daily., Disp: , Rfl:    nystatin (MYCOSTATIN) 100000 UNIT/ML suspension, Take 5 mLs by mouth 4 (four) times daily., Disp: , Rfl:    Ondansetron  HCl (ZOFRAN  PO), 4 mg. Take 1 tablet on the tongue and allow to dissolve, Disp: , Rfl:    phenol (CHLORASEPTIC) 1.4 % LIQD, Use as directed 1 spray in the mouth or throat as needed for throat irritation / pain., Disp: , Rfl:    potassium chloride  SA (KLOR-CON  M) 20 MEQ tablet, Take 3 tablets (60 mEq total) by mouth 2 (two) times daily., Disp: 572 tablet, Rfl: 3   risperiDONE  (RISPERDAL ) 0.5 MG tablet, Take 1 tablet (0.5 mg total) by mouth 2 (two) times daily at 8 am and 4 pm., Disp: 60 tablet, Rfl: 5   simvastatin  (ZOCOR ) 10 MG tablet, TAKE 1 TABLET EVERY EVENING, Disp: 90 tablet, Rfl: 3   spironolactone  (ALDACTONE ) 25 MG tablet, TAKE 1 TABLET (25 MG TOTAL) BY MOUTH EVERY EVENING., Disp: 90 tablet, Rfl: 3   tirzepatide  (MOUNJARO ) 10 MG/0.5ML Pen, Inject 10 mg into the skin once a week., Disp: 2 mL, Rfl: 0   torsemide  (DEMADEX ) 100 MG tablet, Take 1 tablet  (100 mg total) by mouth 2 (two) times daily., Disp: 180 tablet, Rfl: 3 Allergies  Allergen Reactions   Ceftin Anaphylaxis    Face and throat swell    Cefuroxime Axetil Anaphylaxis    Face and throat swell   Geodon [Ziprasidone Hcl] Hives   Lisinopril  Other (See Comments)    angioedema  lisinopril    Shellfish-Derived Products Other (See Comments)    Other reaction(s): Other  shellfish derived   Cefuroxime     Other reaction(s): anaphylaxis   Sulfacetamide Sodium-Sulfur      Other reaction(s): itch   Allopurinol  Nausea Only and Other (See Comments)    weakness   Ativan  [Lorazepam ] Itching   Diazepam  Other (See Comments)    Patient states that diazepam  doesn't relax, it has the opposite effect.  Valium    Sulfa Antibiotics Itching     Social History   Socioeconomic History   Marital status: Married    Spouse name: Dallas   Number of children: 2   Years of education: 18   Highest education level: Master's degree (e.g., MA, MS, MEng, MEd, MSW, MBA)  Occupational History   Occupation: retired  Tobacco Use   Smoking status: Every Day    Current packs/day: 0.00    Average packs/day: 0.5 packs/day for 26.0 years (13.0 ttl pk-yrs)    Types: Cigarettes    Start date: 03/24/1995    Last attempt to quit: 03/23/2021    Years since quitting: 2.7   Smokeless tobacco: Never  Vaping Use   Vaping status: Never Used  Substance and Sexual Activity   Alcohol use: No   Drug use: No   Sexual activity: Yes  Other Topics Concern   Not on file  Social History Narrative   Tobacco Use Cigarettes: Former Smoker, Quit in 2008   No Alcohol   No recreational drug use   Diet: Regular/Low Carb   Exercise: None   Occupation: disabled   Education: Company secretary, masters   Children: 2   Firearms: No   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   Two story home   Lives with spouse Dallas      Social  Drivers of Health   Financial Resource Strain: Low Risk   (09/17/2023)   Overall Financial Resource Strain (CARDIA)    Difficulty of Paying Living Expenses: Not very hard  Food Insecurity: No Food Insecurity (11/01/2023)   Hunger Vital Sign    Worried About Running Out of Food in the Last Year: Never true    Ran Out of Food in the Last Year: Never true  Transportation Needs: No Transportation Needs (11/01/2023)   PRAPARE - Administrator, Civil Service (Medical): No    Lack of Transportation (Non-Medical): No  Physical Activity: Insufficiently Active (09/17/2023)   Exercise Vital Sign    Days of Exercise per Week: 1 day    Minutes of Exercise per Session: 20 min  Stress: Stress Concern Present (09/17/2023)   [name] of Occupational Health - Occupational Stress Questionnaire    Feeling of Stress : To some extent  Social Connections: Socially Integrated (09/17/2023)   Social Connection and Isolation Panel    Frequency of Communication with Friends and Family: Twice a week    Frequency of Social Gatherings with Friends and Family: Twice a week    Attends Religious Services: 1 to 4 times per year    Active Member of Clubs or Organizations: Patient unable to answer    Attends Banker Meetings: 1 to 4 times per year    Marital Status: Married  Catering manager Violence: Not At Risk (11/01/2023)   Humiliation, Afraid, Rape, and Kick questionnaire    Fear of Current or Ex-Partner: No    Emotionally Abused: No    Physically Abused: No    Sexually Abused: No    Physical Exam      Future Appointments  Date Time Provider Department Center  12/29/2023  3:00 PM Gaynel Delon CROME, DPM [facility]  01/07/2024  9:30 AM Slusher, Santana LABOR, RN [facility] None  01/12/2024 10:30 AM Tobie Breslow K, DO [facility] None  01/19/2024  7:10 AM [facility] H[facility] DEVICE REMOTES [facility]  03/09/2024  3:20 PM Arfeen, Leni DASEN, MD [facility] None  03/13/2024  7:10 AM [facility] HVT DEVICE REMOTES [facility]  06/12/2024  7:10 AM [facility] HVT DEVICE  REMOTES [facility]  09/11/2024  7:10 AM [facility] HVT DEVICE REMOTES [facility]  12/11/2024  7:10 AM [facility] HVT DEVICE REMOTES [facility]  03/12/2025  7:10 AM [facility] HVT DEVICE REMOTES [facility]

## 2023-12-15 NOTE — Telephone Encounter (Signed)
 Spoke with patient's husband per DPR.  Patient doing well on Mounjaro  10 mg weekly.  Would like to increase to 12.5.  Rx sent.

## 2023-12-15 NOTE — Telephone Encounter (Signed)
 HF Paramedicine Message to [facility]  Pharmacy (if applicable): [facility]    Issue/reason for call: needs refill for Monjaro   Medication refill? Yes- Monjaro    - missed last week due to not receiving refills   [name], EMT-Paramedic [phone] 12/15/2023

## 2023-12-15 NOTE — Addendum Note (Signed)
 Addended by: DARRELL BRUCKNER on: 12/15/2023 01:44 PM   Modules accepted: Orders

## 2023-12-15 NOTE — Progress Notes (Signed)
 EPIC Encounter for ICM Monitoring  Patient Name: Rhonda [name] is a 73 y.o. female Date: 12/15/2023 Primary Care Physican: Chrystal Lamarr GORMAN, MD Primary Cardiologist: Bensimhon Electrophysiologist: Waddell Pore Pacing: 99.9%   06/01/2022 Weight: 193 lbs  04/26/2023 Weight: 214 lbs Per EMT note 07/14/2023 Weight: 226 lbs 10/13/2023 Office Weight: 210 lbs 12/15/2023 Weight: 206.3 lbs per [name], paramedic visit         Transmission results reviewed.  Spoke with [name], of paramed program.  She had home visit with patient today, 7/23 and patient's legs are slightly swollen today, no SOB and weight stable around 205-206 lbs.  Powell will encourage her to wear compression stockings.                 Optivol thoracic impedance suggesting intermittent days with possible fluid accumulation within the last month.     Prescribed:  [name] with paramedicine program assists with meds. Torsemide  100 mg 1 tablet(s) (100 mg total) by mouth twice a day  Potassium 20 meq 3 tablets (60 mEq total) by mouth 2 (twice) a day.   Metolazone  2.5mg  1 tablet by mouth once a week (every Tuesday). Spironolactone  25 mg take 1 tablet by mouth every evening.   Labs: 10/25/2023 Creatinine 1.78, BUN 46, Potassium 3.3, Sodium 136, GFR 30 10/13/2023 Creatinine 1.91, BUN 41, Potassium 3.5, Sodium 135, GFR 28 07/28/2023 Creatinine 2.06, BUN 52, Potassium 3.5, Sodium 135, eGFR 25 07/14/2023 Creatinine 1.91, BUN 47, Potassium 3.3, Sodium 134, eGFR 28 A complete set of results can be found in Results Review.   Recommendations:   No changes.    Follow-up plan: ICM clinic phone appointment on 01/19/2024.   91 day device clinic remote transmission 03/13/2024.     EP/Cardiology Office Visits:  Recall 02/10/2024 with Dr Cherrie (4 month w/echo).  Recall 03/21/2024 with Dr Waddell.     Copy of ICM check sent to Dr. Waddell.   3 month ICM trend: 12/13/2023.    12-14 Month ICM trend:     [name], RN 12/15/2023 9:25 AM

## 2023-12-20 ENCOUNTER — Encounter

## 2023-12-22 ENCOUNTER — Ambulatory Visit: Payer: Medicare HMO

## 2023-12-22 ENCOUNTER — Other Ambulatory Visit (HOSPITAL_COMMUNITY): Payer: Self-pay

## 2023-12-22 ENCOUNTER — Institutional Professional Consult (permissible substitution): Payer: Medicare HMO | Admitting: Psychology

## 2023-12-22 NOTE — Progress Notes (Signed)
 Paramedicine Encounter    Patient ID: Rhonda [name], female    DOB: Sep 15, 1950, 73 y.o.   MRN: [id number]   Complaints- neck pain   Assessment- CAOX4, warm and dry ambulating in her home reporting to be feeling okay but having some chronic neck pain after sleeping, lower leg swelling noted, weight up 1 lbs. Vitals within normal limits for her. Lungs clear.   Compliance with meds- missed three noon doses   Pill box filled- for one week   Refills needed-  Midodrine  Clonazepam   Bentyl   Wellbutrin   Carvedilol    Meds changes since last visit- none     Social changes- none    VISIT SUMMARY- Arrived for home visit for Rhonda [name] who reports to be feeling well other than having some neck pain after sleeping. Vitals within normal limits on assessment. Weight up 1lbs. Lower legs swollen- she admits to be sitting in her chair a lot without elevating her legs. I encouraged her to do so and to wear her compression socks. She agreed to do so. Her husband also said he tries to encourage her but she doesn't always do it. We discussed diet and exercise she is not adhering to both always. She received her mounjaro  so she should be back on track with same. I reviewed meds and filled pill box for one week. Refills will be called into [facility]. Weekly visit complete. I will see her next week.   BP 110/64   Pulse 88   Resp 16   Wt 207 lb (93.9 kg)   SpO2 99%   BMI 37.86 kg/m  Weight yesterday-- didn't weigh  Last visit weight-- 206lbs      ACTION: Home visit completed     Patient Care Team: Chrystal Lamarr GORMAN, MD as PCP - General (Family Medicine) Waddell Danelle ORN, MD as PCP - Electrophysiology (Cardiology) Cathern Andriette DEL, LCSW as Social Worker (Licensed Clinical Social Worker) Tobie, Tonita POUR, DO as Consulting Physician (Neurology) Jacques Moats, Paramedic as Paramedic Chrystal, Lamarr GORMAN, MD as Attending Physician (Family Medicine) Bensimhon, Toribio SAUNDERS, MD as Consulting Physician  (Cardiology) Curry, Leni DASEN, MD as Consulting Physician (Psychiatry) Tobie Franky SQUIBB, DPM as Consulting Physician (Podiatry) Aniceto Daphne CROME, NP as Nurse Practitioner (Cardiology) Gaynel Delon CROME, DPM as Consulting Physician (Podiatry) Slusher, Santana LABOR, RN as Registered Nurse  Patient Active Problem List   Diagnosis Date Noted   Mild dementia 04/20/2023   Diabetic neuropathy    Elevated troponin 12/02/2021   Acute left ankle pain 10/11/2021   Hyponatremia 10/08/2021   Decreased estrogen level 08/01/2021   Hyperparathyroidism 08/01/2021   Spondylolisthesis 08/01/2021   Varicose veins of bilateral lower extremities with other complications 08/01/2021   Lumbago without sciatica 04/30/2021   Abnormal gait 06/11/2020   Allergic rhinitis 06/11/2020   Asthma 06/11/2020   Benign intracranial hypertension 06/11/2020   Body mass index (BMI) 45.0-49.9, adult 06/11/2020   Bowel incontinence 06/11/2020   Cholelithiasis 06/11/2020   Chronic sinusitis 06/11/2020   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   Daytime somnolence 06/11/2020   Edema 06/11/2020   Family history of malignant neoplasm of gastrointestinal tract 06/11/2020   Gout 06/11/2020   Insomnia 06/11/2020   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   Lumbar spondylosis with myelopathy 06/11/2020   Macrocytosis 06/11/2020   History of colonic polyps 06/11/2020   Repeated falls 06/11/2020   Irritable bowel syndrome 01/04/2020   Spinal stenosis of lumbar region 01/03/2020   Bilateral leg weakness 08/01/2019   Type II diabetes mellitus 06/15/2019  Hypokalemia 11/08/2018   CKD (chronic kidney disease) stage 3, GFR 30-59 ml/min 11/08/2018   Orthostatic hypotension 07/28/2017   On home oxygen  therapy    Migraines    Left bundle branch block    Hypothyroidism    Hypertension    Hyperlipidemia    Heart murmur    GERD (gastroesophageal reflux disease)    Exertional shortness of breath    Major depressive disorder    Back pain    Arthritis     Generalized anxiety disorder    Anemia    Chronic respiratory failure 09/14/2013   Biventricular ICD (implantable cardioverter-defibrillator) in place 08/04/2013   Chronic systolic heart failure 10/27/2012   Endometrial polyp 01/20/2012   Malignant tumor of breast 03/26/2011   Vitamin D  deficiency 03/26/2011    Current Outpatient Medications:    acetaminophen  (TYLENOL ) 500 MG tablet, Take 500 mg by mouth every 6 (six) hours as needed., Disp: , Rfl:    acetaZOLAMIDE  (DIAMOX ) 125 MG tablet, Take 1 tablet (125 mg total) by mouth daily., Disp: 90 tablet, Rfl: 3   albuterol  (VENTOLIN  HFA) 108 (90 Base) MCG/ACT inhaler, INHALE TWO PUFFS BY MOUTH EVERY 4 HOURS AS NEEDED, Disp: , Rfl:    allopurinol  (ZYLOPRIM ) 100 MG tablet, TAKE 1/2 TABLET (50 MG TOTAL) BY MOUTH DAILY., Disp: 45 tablet, Rfl: 1   azelastine  (ASTELIN ) 0.1 % nasal spray, Place 2 sprays into both nostrils 2 (two) times daily. Use in each nostril as directed, Disp: 30 mL, Rfl: 12   buPROPion  (WELLBUTRIN  XL) 150 MG 24 hr tablet, Take 1 tablet (150 mg total) by mouth daily., Disp: 30 tablet, Rfl: 5   carvedilol  (COREG ) 3.125 MG tablet, TAKE 1 TABLET (3.125 MG) BY MOUTH TWICE DAILY WITH MEALS, Disp: 180 tablet, Rfl: 3   citalopram  (CELEXA ) 20 MG tablet, Take 1 tablet (20 mg total) by mouth daily., Disp: 30 tablet, Rfl: 5   clonazePAM  (KLONOPIN ) 0.5 MG tablet, Take 1 tablet (0.5 mg total) by mouth at bedtime., Disp: 30 tablet, Rfl: 5   cyanocobalamin  (VITAMIN B12) 1000 MCG tablet, Take 1,000 mcg by mouth daily., Disp: , Rfl:    desloratadine  (CLARINEX ) 5 MG tablet, TAKE 1 TABLET (5 MG TOTAL) BY MOUTH DAILY FOR ALLERGIES, Disp: 90 tablet, Rfl: 3   dicyclomine  (BENTYL ) 20 MG tablet, Take 20 mg by mouth 3 (three) times daily before meals., Disp: , Rfl:    doxycycline  (VIBRAMYCIN ) 100 MG capsule, Take 1 capsule (100 mg total) by mouth 2 (two) times daily., Disp: 20 capsule, Rfl: 0   famotidine (PEPCID) 20 MG tablet, Take 20 mg by mouth 2 (two)  times daily., Disp: , Rfl:    fluticasone  (FLONASE ) 50 MCG/ACT nasal spray, Place 1 spray into both nostrils daily., Disp: , Rfl:    gabapentin  (NEURONTIN ) 100 MG capsule, TAKE 1 CAPSULE (100 MG TOTAL) BY MOUTH 2 (TWO) TIMES DAILY., Disp: 180 capsule, Rfl: 0   levothyroxine  (SYNTHROID ) 88 MCG tablet, TAKE 1 TABLET(88 MCG) BY MOUTH DAILY BEFORE BREAKFAST, Disp: 90 tablet, Rfl: 3   memantine  (NAMENDA ) 5 MG tablet, Take 1 tablet by mouth daily. (Patient taking differently: Take 1 tablet by mouth 2 (two) times daily.), Disp: , Rfl:    metolazone  (ZAROXOLYN ) 2.5 MG tablet, TAKE 1 TABLET BY MOUTH ONCE A WEEK, Disp: 4 tablet, Rfl: 1   midodrine  (PROAMATINE ) 5 MG tablet, Take 1 tablet (5 mg total) by mouth 3 (three) times daily., Disp: 200 tablet, Rfl: 3   Multiple Vitamin (MULTIVITAMIN WITH MINERALS)  TABS tablet, Take 1 tablet by mouth daily., Disp: , Rfl:    nystatin (MYCOSTATIN) 100000 UNIT/ML suspension, Take 5 mLs by mouth 4 (four) times daily., Disp: , Rfl:    Ondansetron  HCl (ZOFRAN  PO), 4 mg. Take 1 tablet on the tongue and allow to dissolve, Disp: , Rfl:    phenol (CHLORASEPTIC) 1.4 % LIQD, Use as directed 1 spray in the mouth or throat as needed for throat irritation / pain., Disp: , Rfl:    potassium chloride  SA (KLOR-CON  M) 20 MEQ tablet, Take 3 tablets (60 mEq total) by mouth 2 (two) times daily., Disp: 572 tablet, Rfl: 3   risperiDONE  (RISPERDAL ) 0.5 MG tablet, Take 1 tablet (0.5 mg total) by mouth 2 (two) times daily at 8 am and 4 pm., Disp: 60 tablet, Rfl: 5   simvastatin  (ZOCOR ) 10 MG tablet, TAKE 1 TABLET EVERY EVENING, Disp: 90 tablet, Rfl: 3   spironolactone  (ALDACTONE ) 25 MG tablet, TAKE 1 TABLET (25 MG TOTAL) BY MOUTH EVERY EVENING., Disp: 90 tablet, Rfl: 3   tirzepatide  (MOUNJARO ) 12.5 MG/0.5ML Pen, Inject 12.5 mg into the skin once a week., Disp: 2 mL, Rfl: 0   torsemide  (DEMADEX ) 100 MG tablet, Take 1 tablet (100 mg total) by mouth 2 (two) times daily., Disp: 180 tablet, Rfl:  3 Allergies  Allergen Reactions   Ceftin Anaphylaxis    Face and throat swell    Cefuroxime Axetil Anaphylaxis    Face and throat swell   Geodon [Ziprasidone Hcl] Hives   Lisinopril  Other (See Comments)    angioedema  lisinopril    Shellfish-Derived Products Other (See Comments)    Other reaction(s): Other  shellfish derived   Cefuroxime     Other reaction(s): anaphylaxis   Sulfacetamide Sodium-Sulfur      Other reaction(s): itch   Allopurinol  Nausea Only and Other (See Comments)    weakness   Ativan  [Lorazepam ] Itching   Diazepam  Other (See Comments)    Patient states that diazepam  doesn't relax, it has the opposite effect.  Valium    Sulfa Antibiotics Itching     Social History   Socioeconomic History   Marital status: Married    Spouse name: [place]   Number of children: 2   Years of education: 18   Highest education level: Master's degree (e.g., MA, MS, MEng, MEd, MSW, MBA)  Occupational History   Occupation: retired  Tobacco Use   Smoking status: Every Day    Current packs/day: 0.00    Average packs/day: 0.5 packs/day for 26.0 years (13.0 ttl pk-yrs)    Types: Cigarettes    Start date: 03/24/1995    Last attempt to quit: 03/23/2021    Years since quitting: 2.7   Smokeless tobacco: Never  Vaping Use   Vaping status: Never Used  Substance and Sexual Activity   Alcohol use: No   Drug use: No   Sexual activity: Yes  Other Topics Concern   Not on file  Social History Narrative   Tobacco Use Cigarettes: Former Smoker, Quit in 2008   No Alcohol   No recreational drug use   Diet: Regular/Low Carb   Exercise: None   Occupation: disabled   Education: Company secretary, masters   Children: 2   Firearms: No   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   Two story home   Lives with spouse [place]      Social Drivers of Health   Financial Resource Strain: Low Risk  (09/17/2023)  Overall Financial Resource Strain (CARDIA)    Difficulty of  Paying Living Expenses: Not very hard  Food Insecurity: No Food Insecurity (11/01/2023)   Hunger Vital Sign    Worried About Running Out of Food in the Last Year: Never true    Ran Out of Food in the Last Year: Never true  Transportation Needs: No Transportation Needs (11/01/2023)   PRAPARE - Administrator, Civil Service (Medical): No    Lack of Transportation (Non-Medical): No  Physical Activity: Insufficiently Active (09/17/2023)   Exercise Vital Sign    Days of Exercise per Week: 1 day    Minutes of Exercise per Session: 20 min  Stress: Stress Concern Present (09/17/2023)   [name] of Occupational Health - Occupational Stress Questionnaire    Feeling of Stress : To some extent  Social Connections: Socially Integrated (09/17/2023)   Social Connection and Isolation Panel    Frequency of Communication with Friends and Family: Twice a week    Frequency of Social Gatherings with Friends and Family: Twice a week    Attends Religious Services: 1 to 4 times per year    Active Member of Clubs or Organizations: Patient unable to answer    Attends Banker Meetings: 1 to 4 times per year    Marital Status: Married  Catering manager Violence: Not At Risk (11/01/2023)   Humiliation, Afraid, Rape, and Kick questionnaire    Fear of Current or Ex-Partner: No    Emotionally Abused: No    Physically Abused: No    Sexually Abused: No    Physical Exam      Future Appointments  Date Time Provider Department Center  12/29/2023  3:00 PM Gaynel Delon CROME, DPM [facility]  01/07/2024  9:30 AM Slusher, Santana LABOR, RN [facility] None  01/12/2024 10:30 AM Tobie Breslow K, DO [facility] None  01/19/2024  7:10 AM [facility] HVT DEVICE REMOTES [facility]  03/09/2024  3:20 PM Arfeen, Leni DASEN, MD [facility] None  03/13/2024  7:10 AM [facility] HVT DEVICE REMOTES [facility]  06/12/2024  7:10 AM [facility] HVT DEVICE REMOTES [facility]  09/11/2024  7:10 AM [facility] HVT DEVICE REMOTES [facility]   12/11/2024  7:10 AM [facility] HVT DEVICE REMOTES [facility]  03/12/2025  7:10 AM [facility] HVT DEVICE REMOTES [facility]

## 2023-12-23 DIAGNOSIS — N1832 Chronic kidney disease, stage 3b: Secondary | ICD-10-CM | POA: Diagnosis not present

## 2023-12-23 DIAGNOSIS — I130 Hypertensive heart and chronic kidney disease with heart failure and stage 1 through stage 4 chronic kidney disease, or unspecified chronic kidney disease: Secondary | ICD-10-CM | POA: Diagnosis not present

## 2023-12-23 DIAGNOSIS — E1140 Type 2 diabetes mellitus with diabetic neuropathy, unspecified: Secondary | ICD-10-CM | POA: Diagnosis not present

## 2023-12-23 DIAGNOSIS — E039 Hypothyroidism, unspecified: Secondary | ICD-10-CM | POA: Diagnosis not present

## 2023-12-23 DIAGNOSIS — I5041 Acute combined systolic (congestive) and diastolic (congestive) heart failure: Secondary | ICD-10-CM | POA: Diagnosis not present

## 2023-12-23 DIAGNOSIS — I5032 Chronic diastolic (congestive) heart failure: Secondary | ICD-10-CM | POA: Diagnosis not present

## 2023-12-29 ENCOUNTER — Other Ambulatory Visit (HOSPITAL_COMMUNITY): Payer: Self-pay

## 2023-12-29 ENCOUNTER — Encounter: Payer: Self-pay | Admitting: Podiatry

## 2023-12-29 ENCOUNTER — Ambulatory Visit: Admitting: Podiatry

## 2023-12-29 DIAGNOSIS — M79675 Pain in left toe(s): Secondary | ICD-10-CM

## 2023-12-29 DIAGNOSIS — M79674 Pain in right toe(s): Secondary | ICD-10-CM | POA: Diagnosis not present

## 2023-12-29 DIAGNOSIS — B351 Tinea unguium: Secondary | ICD-10-CM | POA: Diagnosis not present

## 2023-12-29 DIAGNOSIS — E1142 Type 2 diabetes mellitus with diabetic polyneuropathy: Secondary | ICD-10-CM | POA: Diagnosis not present

## 2023-12-29 NOTE — Progress Notes (Signed)
 Paramedicine Encounter    Patient ID: Rhonda [name], female    DOB: [birth year], 73 y.o.   MRN: [id number]   Complaints- no complaints   Assessment- CAOX4, warm and dry seated in her chair reporting to be feeling well with no complaints. Lungs clear. Vitals as noted with in normal limits. Weight is down 3 lbs. Lower leg edema present. LLE+3 RLE+2 encouraged her to elevate her legs daily and wear her compression hose. She agreed. No SHOB, No dizziness, No CP.    Compliance with meds- missed two noon doses   Pill box filled- one week   Refills needed- carvedilol , bentyl , allopurinol , midodrine    Meds changes since last visit- none     Social changes- none    VISIT SUMMARY- Home visit for Rhonda [name] who reports she is feeling well with no complaints. Assessment and vitals as noted. Lungs clear, edema present. LLE +3, RLE+2- vitals within normal for her. Weight is down 3 lbs..I reviewed meds and confirmed same filling pill box for one week. Refills called into [facility]. Appointments reviewed with her and her husband. HF education provided with encouragement for her to elevate her legs and wear her compression hose. She agreed. Home visit complete. I will see her in one week.   BP 104/64   Pulse 85   Resp 16   Wt 204 lb (92.5 kg)   SpO2 98%   BMI 37.31 kg/m  Weight yesterday-- did not weigh  Last visit weight-- 207lbs      ACTION: Home visit completed     Patient Care Team: Chrystal Lamarr GORMAN, MD as PCP - General (Family Medicine) Waddell Danelle ORN, MD as PCP - Electrophysiology (Cardiology) Cathern Andriette DEL, LCSW as Social Worker (Licensed Clinical Social Worker) Tobie, Tonita POUR, DO as Consulting Physician (Neurology) Jacques Moats, Paramedic as Paramedic Chrystal, Lamarr GORMAN, MD as Attending Physician (Family Medicine) Bensimhon, Toribio SAUNDERS, MD as Consulting Physician (Cardiology) Curry, Leni DASEN, MD as Consulting Physician (Psychiatry) Tobie Franky SQUIBB, DPM as  Consulting Physician (Podiatry) Aniceto Daphne CROME, NP as Nurse Practitioner (Cardiology) Gaynel Delon CROME, DPM as Consulting Physician (Podiatry) Slusher, Santana LABOR, RN as Registered Nurse  Patient Active Problem List   Diagnosis Date Noted   Mild dementia 04/20/2023   Diabetic neuropathy    Elevated troponin 12/02/2021   Acute left ankle pain 10/11/2021   Hyponatremia 10/08/2021   Decreased estrogen level 08/01/2021   Hyperparathyroidism 08/01/2021   Spondylolisthesis 08/01/2021   Varicose veins of bilateral lower extremities with other complications 08/01/2021   Lumbago without sciatica 04/30/2021   Abnormal gait 06/11/2020   Allergic rhinitis 06/11/2020   Asthma 06/11/2020   Benign intracranial hypertension 06/11/2020   Body mass index (BMI) 45.0-49.9, adult 06/11/2020   Bowel incontinence 06/11/2020   Cholelithiasis 06/11/2020   Chronic sinusitis 06/11/2020   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   Daytime somnolence 06/11/2020   Edema 06/11/2020   Family history of malignant neoplasm of gastrointestinal tract 06/11/2020   Gout 06/11/2020   Insomnia 06/11/2020   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   Lumbar spondylosis with myelopathy 06/11/2020   Macrocytosis 06/11/2020   History of colonic polyps 06/11/2020   Repeated falls 06/11/2020   Irritable bowel syndrome 01/04/2020   Spinal stenosis of lumbar region 01/03/2020   Bilateral leg weakness 08/01/2019   Type II diabetes mellitus 06/15/2019   Hypokalemia 11/08/2018   CKD (chronic kidney disease) stage 3, GFR 30-59 ml/min 11/08/2018   Orthostatic hypotension 07/28/2017   On home oxygen  therapy  Migraines    Left bundle branch block    Hypothyroidism    Hypertension    Hyperlipidemia    Heart murmur    GERD (gastroesophageal reflux disease)    Exertional shortness of breath    Major depressive disorder    Back pain    Arthritis    Generalized anxiety disorder    Anemia    Chronic respiratory failure 09/14/2013    Biventricular ICD (implantable cardioverter-defibrillator) in place 08/04/2013   Chronic systolic heart failure 10/27/2012   Endometrial polyp 01/20/2012   Malignant tumor of breast 03/26/2011   Vitamin D  deficiency 03/26/2011    Current Outpatient Medications:    acetaminophen  (TYLENOL ) 500 MG tablet, Take 500 mg by mouth every 6 (six) hours as needed., Disp: , Rfl:    acetaZOLAMIDE  (DIAMOX ) 125 MG tablet, Take 1 tablet (125 mg total) by mouth daily., Disp: 90 tablet, Rfl: 3   albuterol  (VENTOLIN  HFA) 108 (90 Base) MCG/ACT inhaler, INHALE TWO PUFFS BY MOUTH EVERY 4 HOURS AS NEEDED, Disp: , Rfl:    allopurinol  (ZYLOPRIM ) 100 MG tablet, TAKE 1/2 TABLET (50 MG TOTAL) BY MOUTH DAILY., Disp: 45 tablet, Rfl: 1   azelastine  (ASTELIN ) 0.1 % nasal spray, Place 2 sprays into both nostrils 2 (two) times daily. Use in each nostril as directed, Disp: 30 mL, Rfl: 12   buPROPion  (WELLBUTRIN  XL) 150 MG 24 hr tablet, Take 1 tablet (150 mg total) by mouth daily., Disp: 30 tablet, Rfl: 5   carvedilol  (COREG ) 3.125 MG tablet, TAKE 1 TABLET (3.125 MG) BY MOUTH TWICE DAILY WITH MEALS, Disp: 180 tablet, Rfl: 3   citalopram  (CELEXA ) 20 MG tablet, Take 1 tablet (20 mg total) by mouth daily., Disp: 30 tablet, Rfl: 5   clonazePAM  (KLONOPIN ) 0.5 MG tablet, Take 1 tablet (0.5 mg total) by mouth at bedtime., Disp: 30 tablet, Rfl: 5   cyanocobalamin  (VITAMIN B12) 1000 MCG tablet, Take 1,000 mcg by mouth daily., Disp: , Rfl:    desloratadine  (CLARINEX ) 5 MG tablet, TAKE 1 TABLET (5 MG TOTAL) BY MOUTH DAILY FOR ALLERGIES, Disp: 90 tablet, Rfl: 3   dicyclomine  (BENTYL ) 20 MG tablet, Take 20 mg by mouth 3 (three) times daily before meals., Disp: , Rfl:    doxycycline  (VIBRAMYCIN ) 100 MG capsule, Take 1 capsule (100 mg total) by mouth 2 (two) times daily., Disp: 20 capsule, Rfl: 0   famotidine (PEPCID) 20 MG tablet, Take 20 mg by mouth 2 (two) times daily., Disp: , Rfl:    fluticasone  (FLONASE ) 50 MCG/ACT nasal spray, Place 1  spray into both nostrils daily., Disp: , Rfl:    gabapentin  (NEURONTIN ) 100 MG capsule, TAKE 1 CAPSULE (100 MG TOTAL) BY MOUTH 2 (TWO) TIMES DAILY., Disp: 180 capsule, Rfl: 0   levothyroxine  (SYNTHROID ) 88 MCG tablet, TAKE 1 TABLET(88 MCG) BY MOUTH DAILY BEFORE BREAKFAST, Disp: 90 tablet, Rfl: 3   memantine  (NAMENDA ) 5 MG tablet, Take 1 tablet by mouth daily. (Patient taking differently: Take 1 tablet by mouth 2 (two) times daily.), Disp: , Rfl:    metolazone  (ZAROXOLYN ) 2.5 MG tablet, TAKE 1 TABLET BY MOUTH ONCE A WEEK, Disp: 4 tablet, Rfl: 1   midodrine  (PROAMATINE ) 5 MG tablet, Take 1 tablet (5 mg total) by mouth 3 (three) times daily., Disp: 200 tablet, Rfl: 3   Multiple Vitamin (MULTIVITAMIN WITH MINERALS) TABS tablet, Take 1 tablet by mouth daily., Disp: , Rfl:    nystatin (MYCOSTATIN) 100000 UNIT/ML suspension, Take 5 mLs by mouth 4 (four) times daily.,  Disp: , Rfl:    Ondansetron  HCl (ZOFRAN  PO), 4 mg. Take 1 tablet on the tongue and allow to dissolve, Disp: , Rfl:    phenol (CHLORASEPTIC) 1.4 % LIQD, Use as directed 1 spray in the mouth or throat as needed for throat irritation / pain., Disp: , Rfl:    potassium chloride  SA (KLOR-CON  M) 20 MEQ tablet, Take 3 tablets (60 mEq total) by mouth 2 (two) times daily., Disp: 572 tablet, Rfl: 3   risperiDONE  (RISPERDAL ) 0.5 MG tablet, Take 1 tablet (0.5 mg total) by mouth 2 (two) times daily at 8 am and 4 pm., Disp: 60 tablet, Rfl: 5   simvastatin  (ZOCOR ) 10 MG tablet, TAKE 1 TABLET EVERY EVENING, Disp: 90 tablet, Rfl: 3   spironolactone  (ALDACTONE ) 25 MG tablet, TAKE 1 TABLET (25 MG TOTAL) BY MOUTH EVERY EVENING., Disp: 90 tablet, Rfl: 3   tirzepatide  (MOUNJARO ) 12.5 MG/0.5ML Pen, Inject 12.5 mg into the skin once a week., Disp: 2 mL, Rfl: 0   torsemide  (DEMADEX ) 100 MG tablet, Take 1 tablet (100 mg total) by mouth 2 (two) times daily., Disp: 180 tablet, Rfl: 3 Allergies  Allergen Reactions   Ceftin Anaphylaxis    Face and throat swell     Cefuroxime Axetil Anaphylaxis    Face and throat swell   Geodon [Ziprasidone Hcl] Hives   Lisinopril  Other (See Comments)    angioedema  lisinopril    Shellfish-Derived Products Other (See Comments)    Other reaction(s): Other  shellfish derived   Cefuroxime     Other reaction(s): anaphylaxis   Sulfacetamide Sodium-Sulfur      Other reaction(s): itch   Allopurinol  Nausea Only and Other (See Comments)    weakness   Ativan  [Lorazepam ] Itching   Diazepam  Other (See Comments)    Patient states that diazepam  doesn't relax, it has the opposite effect.  Valium    Sulfa Antibiotics Itching     Social History   Socioeconomic History   Marital status: Married    Spouse name: [place]   Number of children: 2   Years of education: 18   Highest education level: Master's degree (e.g., MA, MS, MEng, MEd, MSW, MBA)  Occupational History   Occupation: retired  Tobacco Use   Smoking status: Every Day    Current packs/day: 0.00    Average packs/day: 0.5 packs/day for 26.0 years (13.0 ttl pk-yrs)    Types: Cigarettes    Start date: 03/24/1995    Last attempt to quit: 03/23/2021    Years since quitting: 2.7   Smokeless tobacco: Never  Vaping Use   Vaping status: Never Used  Substance and Sexual Activity   Alcohol use: No   Drug use: No   Sexual activity: Yes  Other Topics Concern   Not on file  Social History Narrative   Tobacco Use Cigarettes: Former Smoker, Quit in 2008   No Alcohol   No recreational drug use   Diet: Regular/Low Carb   Exercise: None   Occupation: disabled   Education: Company secretary, masters   Children: 2   Firearms: No   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   Two story home   Lives with spouse [place]      Social Drivers of Health   Financial Resource Strain: Low Risk  (09/17/2023)   Overall Financial Resource Strain (CARDIA)    Difficulty of Paying Living Expenses: Not very hard  Food Insecurity: No Food Insecurity (11/01/2023)    Hunger  Vital Sign    Worried About Programme researcher, broadcasting/film/video in the Last Year: Never true    Ran Out of Food in the Last Year: Never true  Transportation Needs: No Transportation Needs (11/01/2023)   PRAPARE - Administrator, Civil Service (Medical): No    Lack of Transportation (Non-Medical): No  Physical Activity: Insufficiently Active (09/17/2023)   Exercise Vital Sign    Days of Exercise per Week: 1 day    Minutes of Exercise per Session: 20 min  Stress: Stress Concern Present (09/17/2023)   [name] of Occupational Health - Occupational Stress Questionnaire    Feeling of Stress : To some extent  Social Connections: Socially Integrated (09/17/2023)   Social Connection and Isolation Panel    Frequency of Communication with Friends and Family: Twice a week    Frequency of Social Gatherings with Friends and Family: Twice a week    Attends Religious Services: 1 to 4 times per year    Active Member of Clubs or Organizations: Patient unable to answer    Attends Banker Meetings: 1 to 4 times per year    Marital Status: Married  Catering manager Violence: Not At Risk (11/01/2023)   Humiliation, Afraid, Rape, and Kick questionnaire    Fear of Current or Ex-Partner: No    Emotionally Abused: No    Physically Abused: No    Sexually Abused: No    Physical Exam      Future Appointments  Date Time Provider Department Center  01/07/2024  9:30 AM Slusher, Santana LABOR, RN [facility] None  01/12/2024 10:30 AM Tobie Breslow K, DO [facility] None  01/19/2024  7:10 AM [facility] HVT DEVICE REMOTES [facility]  03/09/2024  3:20 PM Arfeen, Leni DASEN, MD [facility] None  03/13/2024  7:10 AM [facility] HVT DEVICE REMOTES [facility]  04/19/2024  1:15 PM Gaynel Delon CROME, DPM [facility]  06/12/2024  7:10 AM [facility] HVT DEVICE REMOTES [facility]  09/11/2024  7:10 AM [facility] HVT DEVICE REMOTES [facility]  12/11/2024  7:10 AM [facility] HVT DEVICE REMOTES [facility]  03/12/2025  7:10 AM CVD  HVT DEVICE REMOTES [facility]

## 2023-12-30 ENCOUNTER — Encounter: Payer: Medicare HMO | Admitting: Psychology

## 2024-01-03 ENCOUNTER — Encounter: Payer: Self-pay | Admitting: Podiatry

## 2024-01-03 NOTE — Progress Notes (Signed)
  Subjective:  Patient ID: Rhonda [name], female    DOB: 12/13/1950,  MRN: [id number]  Rhonda [name] presents to clinic today for at risk foot care with history of diabetic neuropathy and painful mycotic toenails of both feet that are difficult to trim. Pain interferes with daily activities and wearing enclosed shoe gear comfortably.  Chief Complaint  Patient presents with   Diabetes    DFC NIDDM A1C 7.0. LOV with PCP 10/2023.Toenail trim.   New problem(s): None.   PCP is Chrystal Lamarr GORMAN, MD.  Allergies  Allergen Reactions   Ceftin Anaphylaxis    Face and throat swell    Cefuroxime Axetil Anaphylaxis    Face and throat swell   Geodon [Ziprasidone Hcl] Hives   Lisinopril  Other (See Comments)    angioedema  lisinopril    Shellfish-Derived Products Other (See Comments)    Other reaction(s): Other  shellfish derived   Cefuroxime     Other reaction(s): anaphylaxis   Sulfacetamide Sodium-Sulfur      Other reaction(s): itch   Allopurinol  Nausea Only and Other (See Comments)    weakness   Ativan  [Lorazepam ] Itching   Diazepam  Other (See Comments)    Patient states that diazepam  doesn't relax, it has the opposite effect.  Valium    Sulfa Antibiotics Itching    Review of Systems: Negative except as noted in the HPI.  Objective: No changes noted in today's physical examination. There were no vitals filed for this visit. Rhonda [name] is a pleasant 73 y.o. female in NAD. AAO x 3.  Vascular Examination: CFT <3 seconds b/l. DP/PT pulses faintly palpable b/l. Pedal edema trace b/l. Skin temperature gradient warm to warm b/l. Digital hair absent b/l. No pain with calf compression. No ischemia or gangrene. No cyanosis or clubbing noted b/l.    Neurological Examination: Pt has subjective symptoms of neuropathy. Protective sensation diminished with 10g monofilament b/l.  Dermatological Examination: Pedal skin warm and supple b/l.   No open wounds. No interdigital  macerations.  Toenails 1-5 b/l thick, discolored, elongated with subungual debris and pain on dorsal palpation.    No hyperkeratotic nor porokeratotic lesions.  Musculoskeletal Examination: Muscle strength 5/5 to all lower extremity muscle groups bilaterally. Pes planus deformity noted bilateral LE.  Radiographs: None  Assessment/Plan: 1. Pain due to onychomycosis of toenails of both feet   2. Diabetic peripheral neuropathy associated with type 2 diabetes mellitus (HCC)     Patient was evaluated and treated. All patient's and/or POA's questions/concerns addressed on today's visit. Toenails 1-5 debrided in length and girth without incident. Continue foot and shoe inspections daily. Monitor blood glucose per PCP/Endocrinologist's recommendations. Continue soft, supportive shoe gear daily. Report any pedal injuries to medical professional. Call office if there are any questions/concerns. -Patient/POA to call should there be question/concern in the interim.   No follow-ups on file.  Delon LITTIE Merlin, DPM      [facility] LOCATION: [address] [place] [address]                   Office [phone]   [name] LOCATION: [address] [place], [place] [address] Office [phone]

## 2024-01-05 ENCOUNTER — Other Ambulatory Visit (HOSPITAL_COMMUNITY): Payer: Self-pay

## 2024-01-05 MED ORDER — MIDODRINE HCL 5 MG PO TABS: 5.0000 mg | ORAL_TABLET | Freq: Three times a day (TID) | ORAL | 3 refills | Status: DC

## 2024-01-05 NOTE — Progress Notes (Signed)
 Paramedicine Encounter    Patient ID: Rhonda [name], female    DOB: [birth year], 73 y.o.   MRN: [id number]   Complaints- fatigue   Assessment- CAOX4, warm and dry seated in her living room complaining  of general fatigue with no complaints of pain or shortness of breath, no edema no dizziness, lungs clear.   Compliance with meds- missed three night doses, missed two noon doses   Pill box filled- for one week   Refills needed- citalopram , gabapentin    Meds changes since last visit- none     Social changes- none    VISIT SUMMARY- Arrived for home visit for Rhonda [name] who reports to be feeling okay just having some fatigue. She denied any chest pain, dizziness, shortness of breath. Lungs clear, no edema noted. I reviewed meds and filled pill box for one week. Refills called into [facility]. I reviewed HF education with sodium restrictions and fluid restrictions. She verbalized understanding. I reviewed upcoming appointments and confirmed same. I plan to see Ajeenah in one week.   BP 98/60   Pulse 72   Resp 16   Wt 203 lb (92.1 kg)   BMI 37.13 kg/m  Weight yesterday-- did not weigh  Last visit weight-- 204lbs      ACTION: Home visit completed     Patient Care Team: Chrystal Lamarr GORMAN, MD as PCP - General (Family Medicine) Waddell Danelle ORN, MD as PCP - Electrophysiology (Cardiology) Cathern Andriette DEL, LCSW as Social Worker (Licensed Clinical Social Worker) Tobie, Tonita POUR, DO as Consulting Physician (Neurology) Jacques Moats, Paramedic as Paramedic Chrystal, Lamarr GORMAN, MD as Attending Physician (Family Medicine) Bensimhon, Toribio SAUNDERS, MD as Consulting Physician (Cardiology) Curry, Leni DASEN, MD as Consulting Physician (Psychiatry) Tobie Franky SQUIBB, DPM as Consulting Physician (Podiatry) Aniceto Daphne CROME, NP as Nurse Practitioner (Cardiology) Gaynel Delon CROME, DPM as Consulting Physician (Podiatry) Slusher, Santana LABOR, RN as Registered Nurse  Patient Active Problem List    Diagnosis Date Noted   Mild dementia 04/20/2023   Diabetic neuropathy    Elevated troponin 12/02/2021   Acute left ankle pain 10/11/2021   Hyponatremia 10/08/2021   Decreased estrogen level 08/01/2021   Hyperparathyroidism 08/01/2021   Spondylolisthesis 08/01/2021   Varicose veins of bilateral lower extremities with other complications 08/01/2021   Lumbago without sciatica 04/30/2021   Abnormal gait 06/11/2020   Allergic rhinitis 06/11/2020   Asthma 06/11/2020   Benign intracranial hypertension 06/11/2020   Body mass index (BMI) 45.0-49.9, adult 06/11/2020   Bowel incontinence 06/11/2020   Cholelithiasis 06/11/2020   Chronic sinusitis 06/11/2020   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   Daytime somnolence 06/11/2020   Edema 06/11/2020   Family history of malignant neoplasm of gastrointestinal tract 06/11/2020   Gout 06/11/2020   Insomnia 06/11/2020   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   Lumbar spondylosis with myelopathy 06/11/2020   Macrocytosis 06/11/2020   History of colonic polyps 06/11/2020   Repeated falls 06/11/2020   Irritable bowel syndrome 01/04/2020   Spinal stenosis of lumbar region 01/03/2020   Bilateral leg weakness 08/01/2019   Type II diabetes mellitus 06/15/2019   Hypokalemia 11/08/2018   CKD (chronic kidney disease) stage 3, GFR 30-59 ml/min 11/08/2018   Orthostatic hypotension 07/28/2017   On home oxygen  therapy    Migraines    Left bundle branch block    Hypothyroidism    Hypertension    Hyperlipidemia    Heart murmur    GERD (gastroesophageal reflux disease)    Exertional shortness of breath    Major  depressive disorder    Back pain    Arthritis    Generalized anxiety disorder    Anemia    Chronic respiratory failure 09/14/2013   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   Chronic systolic heart failure 10/27/2012   Endometrial polyp 01/20/2012   Malignant tumor of breast 03/26/2011   Vitamin D  deficiency 03/26/2011     Current Outpatient Medications:    acetaminophen  (TYLENOL ) 500 MG tablet, Take 500 mg by mouth every 6 (six) hours as needed., Disp: , Rfl:    acetaZOLAMIDE  (DIAMOX ) 125 MG tablet, Take 1 tablet (125 mg total) by mouth daily., Disp: 90 tablet, Rfl: 3   albuterol  (VENTOLIN  HFA) 108 (90 Base) MCG/ACT inhaler, INHALE TWO PUFFS BY MOUTH EVERY 4 HOURS AS NEEDED, Disp: , Rfl:    allopurinol  (ZYLOPRIM ) 100 MG tablet, TAKE 1/2 TABLET (50 MG TOTAL) BY MOUTH DAILY., Disp: 45 tablet, Rfl: 1   azelastine  (ASTELIN ) 0.1 % nasal spray, Place 2 sprays into both nostrils 2 (two) times daily. Use in each nostril as directed, Disp: 30 mL, Rfl: 12   buPROPion  (WELLBUTRIN  XL) 150 MG 24 hr tablet, Take 1 tablet (150 mg total) by mouth daily., Disp: 30 tablet, Rfl: 5   carvedilol  (COREG ) 3.125 MG tablet, TAKE 1 TABLET (3.125 MG) BY MOUTH TWICE DAILY WITH MEALS, Disp: 180 tablet, Rfl: 3   citalopram  (CELEXA ) 20 MG tablet, Take 1 tablet (20 mg total) by mouth daily., Disp: 30 tablet, Rfl: 5   clonazePAM  (KLONOPIN ) 0.5 MG tablet, Take 1 tablet (0.5 mg total) by mouth at bedtime., Disp: 30 tablet, Rfl: 5   cyanocobalamin  (VITAMIN B12) 1000 MCG tablet, Take 1,000 mcg by mouth daily., Disp: , Rfl:    desloratadine  (CLARINEX ) 5 MG tablet, TAKE 1 TABLET (5 MG TOTAL) BY MOUTH DAILY FOR ALLERGIES, Disp: 90 tablet, Rfl: 3   dicyclomine  (BENTYL ) 20 MG tablet, Take 20 mg by mouth 3 (three) times daily before meals., Disp: , Rfl:    doxycycline  (VIBRAMYCIN ) 100 MG capsule, Take 1 capsule (100 mg total) by mouth 2 (two) times daily., Disp: 20 capsule, Rfl: 0   famotidine (PEPCID) 20 MG tablet, Take 20 mg by mouth 2 (two) times daily., Disp: , Rfl:    fluticasone  (FLONASE ) 50 MCG/ACT nasal spray, Place 1 spray into both nostrils daily., Disp: , Rfl:    gabapentin  (NEURONTIN ) 100 MG capsule, TAKE 1 CAPSULE (100 MG TOTAL) BY MOUTH 2 (TWO) TIMES DAILY., Disp: 180 capsule, Rfl: 0   levothyroxine  (SYNTHROID ) 88 MCG tablet, TAKE 1  TABLET(88 MCG) BY MOUTH DAILY BEFORE BREAKFAST, Disp: 90 tablet, Rfl: 3   memantine  (NAMENDA ) 5 MG tablet, Take 1 tablet by mouth daily. (Patient taking differently: Take 1 tablet by mouth 2 (two) times daily.), Disp: , Rfl:    metolazone  (ZAROXOLYN ) 2.5 MG tablet, TAKE 1 TABLET BY MOUTH ONCE A WEEK, Disp: 4 tablet, Rfl: 1   midodrine  (PROAMATINE ) 5 MG tablet, Take 1 tablet (5 mg total) by mouth 3 (three) times daily., Disp: 200 tablet, Rfl: 3   Multiple Vitamin (MULTIVITAMIN WITH MINERALS) TABS tablet, Take 1 tablet by mouth daily., Disp: , Rfl:    nystatin (MYCOSTATIN) 100000 UNIT/ML suspension, Take 5 mLs by mouth 4 (four) times daily., Disp: , Rfl:    Ondansetron  HCl (ZOFRAN  PO), 4 mg. Take 1 tablet on the tongue and allow to dissolve, Disp: , Rfl:    phenol (CHLORASEPTIC) 1.4 % LIQD, Use as directed 1 spray in the mouth or throat  as needed for throat irritation / pain., Disp: , Rfl:    potassium chloride  SA (KLOR-CON  M) 20 MEQ tablet, Take 3 tablets (60 mEq total) by mouth 2 (two) times daily., Disp: 572 tablet, Rfl: 3   risperiDONE  (RISPERDAL ) 0.5 MG tablet, Take 1 tablet (0.5 mg total) by mouth 2 (two) times daily at 8 am and 4 pm., Disp: 60 tablet, Rfl: 5   simvastatin  (ZOCOR ) 10 MG tablet, TAKE 1 TABLET EVERY EVENING, Disp: 90 tablet, Rfl: 3   spironolactone  (ALDACTONE ) 25 MG tablet, TAKE 1 TABLET (25 MG TOTAL) BY MOUTH EVERY EVENING., Disp: 90 tablet, Rfl: 3   tirzepatide  (MOUNJARO ) 12.5 MG/0.5ML Pen, Inject 12.5 mg into the skin once a week., Disp: 2 mL, Rfl: 0   torsemide  (DEMADEX ) 100 MG tablet, Take 1 tablet (100 mg total) by mouth 2 (two) times daily., Disp: 180 tablet, Rfl: 3 Allergies  Allergen Reactions   Ceftin Anaphylaxis    Face and throat swell    Cefuroxime Axetil Anaphylaxis    Face and throat swell   Geodon [Ziprasidone Hcl] Hives   Lisinopril  Other (See Comments)    angioedema  lisinopril    Shellfish-Derived Products Other (See Comments)    Other reaction(s):  Other  shellfish derived   Cefuroxime     Other reaction(s): anaphylaxis   Sulfacetamide Sodium-Sulfur      Other reaction(s): itch   Allopurinol  Nausea Only and Other (See Comments)    weakness   Ativan  [Lorazepam ] Itching   Diazepam  Other (See Comments)    Patient states that diazepam  doesn't relax, it has the opposite effect.  Valium    Sulfa Antibiotics Itching     Social History   Socioeconomic History   Marital status: Married    Spouse name: Dallas   Number of children: 2   Years of education: 18   Highest education level: Master's degree (e.g., MA, MS, MEng, MEd, MSW, MBA)  Occupational History   Occupation: retired  Tobacco Use   Smoking status: Every Day    Current packs/day: 0.00    Average packs/day: 0.5 packs/day for 26.0 years (13.0 ttl pk-yrs)    Types: Cigarettes    Start date: 03/24/1995    Last attempt to quit: 03/23/2021    Years since quitting: 2.7   Smokeless tobacco: Never  Vaping Use   Vaping status: Never Used  Substance and Sexual Activity   Alcohol use: No   Drug use: No   Sexual activity: Yes  Other Topics Concern   Not on file  Social History Narrative   Tobacco Use Cigarettes: Former Smoker, Quit in 2008   No Alcohol   No recreational drug use   Diet: Regular/Low Carb   Exercise: None   Occupation: disabled   Education: Company secretary, masters   Children: 2   Firearms: No   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   Two story home   Lives with spouse Dallas      Social Drivers of Health   Financial Resource Strain: Low Risk  (09/17/2023)   Overall Financial Resource Strain (CARDIA)    Difficulty of Paying Living Expenses: Not very hard  Food Insecurity: No Food Insecurity (11/01/2023)   Hunger Vital Sign    Worried About Running Out of Food in the Last Year: Never true    Ran Out of Food in the Last Year: Never true  Transportation Needs: No Transportation Needs (11/01/2023)   PRAPARE - Transportation  Lack of Transportation (Medical): No    Lack of Transportation (Non-Medical): No  Physical Activity: Insufficiently Active (09/17/2023)   Exercise Vital Sign    Days of Exercise per Week: 1 day    Minutes of Exercise per Session: 20 min  Stress: Stress Concern Present (09/17/2023)   [name] of Occupational Health - Occupational Stress Questionnaire    Feeling of Stress : To some extent  Social Connections: Socially Integrated (09/17/2023)   Social Connection and Isolation Panel    Frequency of Communication with Friends and Family: Twice a week    Frequency of Social Gatherings with Friends and Family: Twice a week    Attends Religious Services: 1 to 4 times per year    Active Member of Clubs or Organizations: Patient unable to answer    Attends Banker Meetings: 1 to 4 times per year    Marital Status: Married  Catering manager Violence: Not At Risk (11/01/2023)   Humiliation, Afraid, Rape, and Kick questionnaire    Fear of Current or Ex-Partner: No    Emotionally Abused: No    Physically Abused: No    Sexually Abused: No    Physical Exam      Future Appointments  Date Time Provider Department Center  01/07/2024  9:30 AM Slusher, Santana LABOR, RN [facility] None  01/12/2024 10:30 AM Tobie Breslow K, DO [facility] None  01/19/2024  7:10 AM CVD HVT DEVICE REMOTES [facility] H&V  03/09/2024  3:20 PM Arfeen, Leni DASEN, MD [facility] None  03/13/2024  7:10 AM CVD HVT DEVICE REMOTES [facility] H&V  04/19/2024  1:15 PM Gaynel Delon CROME, DPM [facility]  06/12/2024  7:10 AM CVD HVT DEVICE REMOTES [facility] H&V  09/11/2024  7:10 AM CVD HVT DEVICE REMOTES [facility] H&V  12/11/2024  7:10 AM CVD HVT DEVICE REMOTES [facility] H&V  03/12/2025  7:10 AM CVD HVT DEVICE REMOTES [facility] H&V

## 2024-01-06 ENCOUNTER — Other Ambulatory Visit: Payer: Self-pay | Admitting: Neurology

## 2024-01-07 ENCOUNTER — Other Ambulatory Visit: Payer: Self-pay

## 2024-01-07 DIAGNOSIS — N1832 Chronic kidney disease, stage 3b: Secondary | ICD-10-CM | POA: Diagnosis not present

## 2024-01-07 DIAGNOSIS — E1140 Type 2 diabetes mellitus with diabetic neuropathy, unspecified: Secondary | ICD-10-CM | POA: Diagnosis not present

## 2024-01-07 DIAGNOSIS — I5032 Chronic diastolic (congestive) heart failure: Secondary | ICD-10-CM | POA: Diagnosis not present

## 2024-01-07 DIAGNOSIS — I5041 Acute combined systolic (congestive) and diastolic (congestive) heart failure: Secondary | ICD-10-CM | POA: Diagnosis not present

## 2024-01-07 NOTE — Patient Instructions (Signed)
 Visit Information  Thank you for taking time to visit with me today. Please don't hesitate to contact me if I can be of assistance to you before our next scheduled appointment.  Your next care management appointment is by telephone on Friday, October 3rd at 9:00am.    Please call the care guide team at [phone] if you need to cancel, schedule, or reschedule an appointment.   Please call the [place]  National Suicide Prevention Lifeline: 334-723-5821 or TTY: (865)121-8811 TTY [phone]) to talk to a trained counselor if you are experiencing a Mental Health or Behavioral Health Crisis or need someone to talk to.  Santana Stamp BSN, CCM [place]  VBCI Population Health RN Care Manager Direct Dial: [phone]  Fax: [phone]

## 2024-01-07 NOTE — Patient Outreach (Addendum)
 Complex Care Management   Visit Note  01/07/2024  Name:  KYNZI LEVAY MRN: [id number] DOB: Aug 24, 1950  Situation: Referral received for Complex Care Management related to Fall Risk. I obtained verbal consent from Caregiver.  Visit completed with Mr. Hasty  on the phone. Main concern is keeping patient safe due to Dementia, decreased ability to perform IADL's.   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/2013   Body mass index (BMI) 45.0-49.9, adult 06/11/2020   Bowel incontinence 06/11/2020   Cardiomyopathy    Cholelithiasis 06/11/2020   Chronic respiratory failure 09/14/2013   Chronic sinusitis 06/11/2020   Chronic systolic heart failure 10/27/2012   a) NICM b) ECHO (03/2013) EF 20-25% c) ECHO (09/2013) EF 45-50%, grade I DD   CKD (chronic kidney disease) stage 3, GFR 30-59 ml/min 11/08/2018   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   Complication of anesthesia    History of low blood pressure after surgery; attributed to lying flat   Daytime somnolence 06/11/2020   Decreased estrogen level 08/01/2021   Diabetic neuropathy    Diarrhea of presumed infectious origin 06/11/2020   Edema 06/11/2020   Elevated troponin 12/02/2021   Endometrial polyp 01/20/2012   Exertional shortness of breath    Generalized anxiety disorder    GERD (gastroesophageal reflux disease)    Gout 06/11/2020   Heart murmur    History of colonic polyps 06/11/2020   History of fall 06/11/2020   Hyperlipidemia    Hyperparathyroidism 08/01/2021   Hypertension    Hypokalemia 11/08/2018   Hyponatremia  10/08/2021   Hypothyroidism    Insomnia 06/11/2020   Intractable nausea and vomiting 10/08/2021   Irritable bowel syndrome 01/04/2020   Left bundle branch block    s/p CRT-D (04/2013)   Lumbago without sciatica 04/30/2021   Lumbar spondylosis with myelopathy 06/11/2020   Macrocytosis 06/11/2020   Major depressive disorder    Malignant tumor of breast 03/26/2011   Left; completed chemotherapy and radiation treatments   Migraines    Mild dementia 04/20/2023   On home oxygen  therapy    2L suppose to be q night (05/03/2013)   Orthostatic hypotension 07/28/2017   Spinal stenosis of lumbar region 01/03/2020   Spondylolisthesis 08/01/2021   SVD (spontaneous vaginal delivery)    x 2   Type II diabetes mellitus 06/15/2019   Varicose veins of bilateral lower extremities with other complications 08/01/2021   Vitamin D  deficiency 03/26/2011    Assessment: Patient Reported Symptoms:  Cognitive Cognitive Status: Able to follow simple commands      Neurological Neurological Review of Symptoms: No symptoms reported    HEENT HEENT Symptoms Reported: Not assessed      Cardiovascular Other Cardiovascular Symptoms: Reports some swelling in ankles but at baseline.  [name] from the Paramedic program checks her legs/feet/ankles each week when she visits and reports back to Cardiology for acute symptoms. Does patient have uncontrolled Hypertension?: No Cardiovascular Self-Management Outcome: 4 (good)  Respiratory Respiratory Symptoms Reported: No symptoms reported    Endocrine Endocrine Symptoms Reported: No symptoms reported Is patient diabetic?: Yes  Endocrine Comment: Takes Mounjaro  weekly  Gastrointestinal Gastrointestinal Symptoms Reported: No symptoms reported Additional Gastrointestinal Details: Takes Mralax as needed      Genitourinary Genitourinary Symptoms Reported: Incontinence Additional Genitourinary Details: Occasional incontinence at night, wears adult briefs.  Husband  went to [facility] supplies on [address], got a heavy duty pad and plastic sheets.    Integumentary Integumentary Symptoms Reported: No symptoms reported    Musculoskeletal Musculoskelatal Symptoms Reviewed: Unsteady gait Additional Musculoskeletal Details: Uses a cane.   Falls in the past year?: No    Psychosocial Additional Psychological Details: Husband continues to work on gathering tax forms to apply for Medicaid (his tax preparer is having health issues and can't get to the forms).  States patient is involved with Dept of Social Services, DSS provides an aide twice a week for a couple of hours.  Parametic program comes out once a week for medication management, checks vitals, checks status of bilateral LE swelling. She is also receiving Meals on Wheels, husband is very Adult nurse.            09/17/2023    4:43 PM  Depression screen PHQ 2/9  Decreased Interest 0  Down, Depressed, Hopeless 1  PHQ - 2 Score 1    There were no vitals filed for this visit.  Medications Reviewed Today   Medications were not reviewed in this encounter     Recommendation:   Continue Current Plan of Care Mr. Flammer will continue on gathering tax forms so daughter can help him apply for Medicaid.  He is thankful for [employer] and will continue accepting weekly visits.  Will continue to receive Meals on wheels.  Will continue to receive aide service from [facility] twice weekly while waiting on DSS budget to allow for more time.   Follow Up Plan:   Telephone follow-up in 1 month  Santana Stamp BSN, CCM [place]  VBCI Population Health RN Care Manager Direct Dial: [phone]  Fax: [phone]

## 2024-01-11 ENCOUNTER — Ambulatory Visit: Payer: Medicare HMO | Admitting: Neurology

## 2024-01-12 ENCOUNTER — Ambulatory Visit: Admitting: Neurology

## 2024-01-12 ENCOUNTER — Other Ambulatory Visit (HOSPITAL_COMMUNITY): Payer: Self-pay

## 2024-01-12 NOTE — Progress Notes (Signed)
 Paramedicine Encounter    Patient ID: Rhonda [name], female    DOB: 1950-12-11, 73 y.o.   MRN: [id number]   Complaints- headache and sore throat   Assessment- CAOx4 warm and dry reporting to be feeling okay but having a headache and sore throat. No lower leg edema, lungs clear. Vitals within normal for her. Weight is up 3 lbs from last week.   Compliance with meds- missed one days doses   Pill box filled- one week   Refills needed- gabapentin   Meds changes since last visit- none     Social changes- none    VISIT SUMMARY- Arrived for home visit for Rhonda [name] who reports to be feeling okay but having a headache and sore throat. She is taking Tylenol  for same. She missed one days medications on Sunday. Her weight is up 3 lbs from last week. Vitals as noted. I reviewed meds and filled pill box for one week. Refills will be called into [facility]. I reviewed upcoming appointments and HF education for her and her husband. Home visit complete. I will see Rhonda [name] in one week.   BP 126/86   Pulse 93   Resp 16   Wt 205 lb 8 oz (93.2 kg)   SpO2 98%   BMI 37.59 kg/m  Weight yesterday-- did not weigh  Last visit weight-- 202lbs      ACTION: Home visit completed     Patient Care Team: Chrystal Lamarr GORMAN, MD as PCP - General (Family Medicine) Waddell Danelle ORN, MD as PCP - Electrophysiology (Cardiology) Cathern Andriette DEL, LCSW as Social Worker (Licensed Clinical Social Worker) Tobie, Tonita POUR, DO as Consulting Physician (Neurology) Jacques Moats, Paramedic as Paramedic Chrystal, Lamarr GORMAN, MD as Attending Physician (Family Medicine) Bensimhon, Toribio SAUNDERS, MD as Consulting Physician (Cardiology) Curry, Leni DASEN, MD as Consulting Physician (Psychiatry) Tobie Franky SQUIBB, DPM as Consulting Physician (Podiatry) Aniceto Daphne CROME, NP as Nurse Practitioner (Cardiology) Gaynel Delon CROME, DPM as Consulting Physician (Podiatry) Slusher, Santana LABOR, RN as Registered Nurse  Patient Active  Problem List   Diagnosis Date Noted   Mild dementia 04/20/2023   Diabetic neuropathy    Elevated troponin 12/02/2021   Acute left ankle pain 10/11/2021   Hyponatremia 10/08/2021   Decreased estrogen level 08/01/2021   Hyperparathyroidism 08/01/2021   Spondylolisthesis 08/01/2021   Varicose veins of bilateral lower extremities with other complications 08/01/2021   Lumbago without sciatica 04/30/2021   Abnormal gait 06/11/2020   Allergic rhinitis 06/11/2020   Asthma 06/11/2020   Benign intracranial hypertension 06/11/2020   Body mass index (BMI) 45.0-49.9, adult 06/11/2020   Bowel incontinence 06/11/2020   Cholelithiasis 06/11/2020   Chronic sinusitis 06/11/2020   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   Daytime somnolence 06/11/2020   Edema 06/11/2020   Family history of malignant neoplasm of gastrointestinal tract 06/11/2020   Gout 06/11/2020   Insomnia 06/11/2020   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   Lumbar spondylosis with myelopathy 06/11/2020   Macrocytosis 06/11/2020   History of colonic polyps 06/11/2020   Repeated falls 06/11/2020   Irritable bowel syndrome 01/04/2020   Spinal stenosis of lumbar region 01/03/2020   Bilateral leg weakness 08/01/2019   Type II diabetes mellitus 06/15/2019   Hypokalemia 11/08/2018   CKD (chronic kidney disease) stage 3, GFR 30-59 ml/min 11/08/2018   Orthostatic hypotension 07/28/2017   On home oxygen  therapy    Migraines    Left bundle branch block    Hypothyroidism    Hypertension    Hyperlipidemia    Heart  murmur    GERD (gastroesophageal reflux disease)    Exertional shortness of breath    Major depressive disorder    Back pain    Arthritis    Generalized anxiety disorder    Anemia    Chronic respiratory failure 09/14/2013   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   Chronic systolic heart failure 10/27/2012   Endometrial polyp 01/20/2012   Malignant tumor of breast 03/26/2011   Vitamin D  deficiency  03/26/2011    Current Outpatient Medications:    acetaminophen  (TYLENOL ) 500 MG tablet, Take 500 mg by mouth every 6 (six) hours as needed., Disp: , Rfl:    acetaZOLAMIDE  (DIAMOX ) 125 MG tablet, Take 1 tablet (125 mg total) by mouth daily., Disp: 90 tablet, Rfl: 3   albuterol  (VENTOLIN  HFA) 108 (90 Base) MCG/ACT inhaler, INHALE TWO PUFFS BY MOUTH EVERY 4 HOURS AS NEEDED, Disp: , Rfl:    allopurinol  (ZYLOPRIM ) 100 MG tablet, TAKE 1/2 TABLET (50 MG TOTAL) BY MOUTH DAILY., Disp: 45 tablet, Rfl: 1   azelastine  (ASTELIN ) 0.1 % nasal spray, Place 2 sprays into both nostrils 2 (two) times daily. Use in each nostril as directed, Disp: 30 mL, Rfl: 12   buPROPion  (WELLBUTRIN  XL) 150 MG 24 hr tablet, Take 1 tablet (150 mg total) by mouth daily., Disp: 30 tablet, Rfl: 5   carvedilol  (COREG ) 3.125 MG tablet, TAKE 1 TABLET (3.125 MG) BY MOUTH TWICE DAILY WITH MEALS, Disp: 180 tablet, Rfl: 3   citalopram  (CELEXA ) 20 MG tablet, Take 1 tablet (20 mg total) by mouth daily., Disp: 30 tablet, Rfl: 5   clonazePAM  (KLONOPIN ) 0.5 MG tablet, Take 1 tablet (0.5 mg total) by mouth at bedtime., Disp: 30 tablet, Rfl: 5   cyanocobalamin  (VITAMIN B12) 1000 MCG tablet, Take 1,000 mcg by mouth daily., Disp: , Rfl:    desloratadine  (CLARINEX ) 5 MG tablet, TAKE 1 TABLET (5 MG TOTAL) BY MOUTH DAILY FOR ALLERGIES, Disp: 90 tablet, Rfl: 3   dicyclomine  (BENTYL ) 20 MG tablet, Take 20 mg by mouth 3 (three) times daily before meals., Disp: , Rfl:    doxycycline  (VIBRAMYCIN ) 100 MG capsule, Take 1 capsule (100 mg total) by mouth 2 (two) times daily., Disp: 20 capsule, Rfl: 0   famotidine (PEPCID) 20 MG tablet, Take 20 mg by mouth 2 (two) times daily., Disp: , Rfl:    fluticasone  (FLONASE ) 50 MCG/ACT nasal spray, Place 1 spray into both nostrils daily., Disp: , Rfl:    gabapentin  (NEURONTIN ) 100 MG capsule, TAKE 1 CAPSULE (100 MG TOTAL) BY MOUTH 2 (TWO) TIMES DAILY., Disp: 60 capsule, Rfl: 0   levothyroxine  (SYNTHROID ) 88 MCG tablet,  TAKE 1 TABLET(88 MCG) BY MOUTH DAILY BEFORE BREAKFAST, Disp: 90 tablet, Rfl: 3   memantine  (NAMENDA ) 5 MG tablet, Take 1 tablet by mouth daily. (Patient taking differently: Take 1 tablet by mouth 2 (two) times daily.), Disp: , Rfl:    metolazone  (ZAROXOLYN ) 2.5 MG tablet, TAKE 1 TABLET BY MOUTH ONCE A WEEK, Disp: 4 tablet, Rfl: 1   midodrine  (PROAMATINE ) 5 MG tablet, Take 1 tablet (5 mg total) by mouth 3 (three) times daily., Disp: 200 tablet, Rfl: 3   Multiple Vitamin (MULTIVITAMIN WITH MINERALS) TABS tablet, Take 1 tablet by mouth daily., Disp: , Rfl:    nystatin (MYCOSTATIN) 100000 UNIT/ML suspension, Take 5 mLs by mouth 4 (four) times daily., Disp: , Rfl:    Ondansetron  HCl (ZOFRAN  PO), 4 mg. Take 1 tablet on the tongue and allow to dissolve, Disp: ,  Rfl:    phenol (CHLORASEPTIC) 1.4 % LIQD, Use as directed 1 spray in the mouth or throat as needed for throat irritation / pain., Disp: , Rfl:    potassium chloride  SA (KLOR-CON  M) 20 MEQ tablet, Take 3 tablets (60 mEq total) by mouth 2 (two) times daily., Disp: 572 tablet, Rfl: 3   risperiDONE  (RISPERDAL ) 0.5 MG tablet, Take 1 tablet (0.5 mg total) by mouth 2 (two) times daily at 8 am and 4 pm., Disp: 60 tablet, Rfl: 5   simvastatin  (ZOCOR ) 10 MG tablet, TAKE 1 TABLET EVERY EVENING, Disp: 90 tablet, Rfl: 3   spironolactone  (ALDACTONE ) 25 MG tablet, TAKE 1 TABLET (25 MG TOTAL) BY MOUTH EVERY EVENING., Disp: 90 tablet, Rfl: 3   tirzepatide  (MOUNJARO ) 12.5 MG/0.5ML Pen, Inject 12.5 mg into the skin once a week., Disp: 2 mL, Rfl: 0   torsemide  (DEMADEX ) 100 MG tablet, Take 1 tablet (100 mg total) by mouth 2 (two) times daily., Disp: 180 tablet, Rfl: 3 Allergies  Allergen Reactions   Ceftin Anaphylaxis    Face and throat swell    Cefuroxime Axetil Anaphylaxis    Face and throat swell   Geodon [Ziprasidone Hcl] Hives   Lisinopril  Other (See Comments)    angioedema  lisinopril    Shellfish-Derived Products Other (See Comments)    Other  reaction(s): Other  shellfish derived   Cefuroxime     Other reaction(s): anaphylaxis   Sulfacetamide Sodium-Sulfur      Other reaction(s): itch   Allopurinol  Nausea Only and Other (See Comments)    weakness   Ativan  [Lorazepam ] Itching   Diazepam  Other (See Comments)    Patient states that diazepam  doesn't relax, it has the opposite effect.  Valium    Sulfa Antibiotics Itching     Social History   Socioeconomic History   Marital status: Married    Spouse name: Dallas   Number of children: 2   Years of education: 18   Highest education level: Master's degree (e.g., MA, MS, MEng, MEd, MSW, MBA)  Occupational History   Occupation: retired  Tobacco Use   Smoking status: Every Day    Current packs/day: 0.00    Average packs/day: 0.5 packs/day for 26.0 years (13.0 ttl pk-yrs)    Types: Cigarettes    Start date: 03/24/1995    Last attempt to quit: 03/23/2021    Years since quitting: 2.8   Smokeless tobacco: Never  Vaping Use   Vaping status: Never Used  Substance and Sexual Activity   Alcohol use: No   Drug use: No   Sexual activity: Yes  Other Topics Concern   Not on file  Social History Narrative   Tobacco Use Cigarettes: Former Smoker, Quit in 2008   No Alcohol   No recreational drug use   Diet: Regular/Low Carb   Exercise: None   Occupation: disabled   Education: Company secretary, masters   Children: 2   Firearms: No   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   Two story home   Lives with spouse Dallas      Social Drivers of Health   Financial Resource Strain: Low Risk  (09/17/2023)   Overall Financial Resource Strain (CARDIA)    Difficulty of Paying Living Expenses: Not very hard  Food Insecurity: No Food Insecurity (11/01/2023)   Hunger Vital Sign    Worried About Running Out of Food in the Last Year: Never true    Ran Out of Food in  the Last Year: Never true  Transportation Needs: No Transportation Needs (11/01/2023)   PRAPARE -  Administrator, Civil Service (Medical): No    Lack of Transportation (Non-Medical): No  Physical Activity: Insufficiently Active (09/17/2023)   Exercise Vital Sign    Days of Exercise per Week: 1 day    Minutes of Exercise per Session: 20 min  Stress: Stress Concern Present (09/17/2023)   [name] of Occupational Health - Occupational Stress Questionnaire    Feeling of Stress : To some extent  Social Connections: Socially Integrated (09/17/2023)   Social Connection and Isolation Panel    Frequency of Communication with Friends and Family: Twice a week    Frequency of Social Gatherings with Friends and Family: Twice a week    Attends Religious Services: 1 to 4 times per year    Active Member of Clubs or Organizations: Patient unable to answer    Attends Banker Meetings: 1 to 4 times per year    Marital Status: Married  Catering manager Violence: Not At Risk (11/01/2023)   Humiliation, Afraid, Rape, and Kick questionnaire    Fear of Current or Ex-Partner: No    Emotionally Abused: No    Physically Abused: No    Sexually Abused: No    Physical Exam      Future Appointments  Date Time Provider Department Center  01/12/2024 10:30 AM Tobie Breslow K, DO [facility] None  01/19/2024  7:10 AM [facility] H[facility] DEVICE REMOTES [facility]  02/25/2024  9:00 AM Slusher, Santana LABOR, RN [facility] None  03/09/2024  3:20 PM Arfeen, Leni DASEN, MD [facility] None  03/13/2024  7:10 AM [facility] H[facility] DEVICE REMOTES [facility]  04/19/2024  1:15 PM Gaynel Delon CROME, DPM [facility]  06/12/2024  7:10 AM [facility] HVT DEVICE REMOTES [facility]  09/11/2024  7:10 AM [facility] DEVICE REMOTES [facility]  12/11/2024  7:10 AM [facility] HVT DEVICE REMOTES [facility]  03/12/2025  7:10 AM [facility] HVT DEVICE REMOTES [facility]

## 2024-01-19 ENCOUNTER — Ambulatory Visit: Attending: Internal Medicine

## 2024-01-19 ENCOUNTER — Other Ambulatory Visit (HOSPITAL_COMMUNITY): Payer: Self-pay

## 2024-01-19 ENCOUNTER — Encounter (HOSPITAL_COMMUNITY): Payer: Self-pay

## 2024-01-19 ENCOUNTER — Telehealth (HOSPITAL_COMMUNITY): Payer: Self-pay

## 2024-01-19 ENCOUNTER — Telehealth (HOSPITAL_COMMUNITY): Payer: Self-pay | Admitting: Pharmacy Technician

## 2024-01-19 ENCOUNTER — Other Ambulatory Visit: Payer: Self-pay

## 2024-01-19 ENCOUNTER — Other Ambulatory Visit (HOSPITAL_COMMUNITY): Payer: Self-pay | Admitting: Pharmacy Technician

## 2024-01-19 DIAGNOSIS — Z95810 Presence of automatic (implantable) cardiac defibrillator: Secondary | ICD-10-CM

## 2024-01-19 DIAGNOSIS — I5022 Chronic systolic (congestive) heart failure: Secondary | ICD-10-CM | POA: Diagnosis not present

## 2024-01-19 MED ORDER — FUROSCIX 80 MG/10ML ~~LOC~~ CTKT
80.0000 mg | CARTRIDGE | Freq: Every day | SUBCUTANEOUS | 1 refills | Status: AC | PRN
  Filled 2024-01-19: qty 5, 5d supply, fill #0

## 2024-01-19 NOTE — Progress Notes (Unsigned)
 EPIC Encounter for ICM Monitoring  Patient Name: Rhonda [name] is a 73 y.o. female Date: 01/19/2024 Primary Care Physican: Chrystal Lamarr GORMAN, MD Primary Cardiologist: Bensimhon Electrophysiologist: Waddell Pore Pacing: 99.6%   06/01/2022 Weight: 193 lbs  04/26/2023 Weight: 214 lbs Per EMT note 07/14/2023 Weight: 226 lbs 10/13/2023 Office Weight: 210 lbs 12/15/2023 Weight: 206.3 lbs per [name], paramedic visit 01/12/2024 Weight: 205.8 lbs per [name], paramedic visit  01/19/2024 Weight: 203 lbs         Spoke with [name], of paramed program during her home visit.                  Pt reports following fluid symptoms:   Weight gain: None SOB:  None  Difficulty sleeping at night:  No Swelling: None - wearing compression stockings and elevating feet when sitting.   Diet/fluid intake changes:  Pt started Meals on Wheels at the end of July which are not typically low sodium and also starting eating restaurant breakfast food everyday for past 2-3 weeks. Possible reason for fluid accumulation:  Seems to correlate with start of Meals on Wheels food and daily breakfast restaurant foods since end of July.        Optivol thoracic impedance suggesting possible fluid accumulation starting 7/30.     Prescribed:  [name] with paramedicine program assists with meds.  [name] reports 01/19/24 there are no missed dosages of patients medications in the last 3 weeks Torsemide  100 mg 1 tablet(s) (100 mg total) by mouth twice a day  Potassium 20 meq 3 tablets (60 mEq total) by mouth 2 (twice) a day.   Metolazone  2.5mg  1 tablet by mouth once a week (every Tuesday). Spironolactone  25 mg take 1 tablet by mouth every evening.   Labs: 10/25/2023 Creatinine 1.78, BUN 46, Potassium 3.3, Sodium 136, GFR 30 10/13/2023 Creatinine 1.91, BUN 41, Potassium 3.5, Sodium 135, GFR 28 07/28/2023 Creatinine 2.06, BUN 52, Potassium 3.5, Sodium 135, eGFR 25 07/14/2023 Creatinine 1.91, BUN 47,  Potassium 3.3, Sodium 134, eGFR 28 A complete set of results can be found in Results Review.   Recommendations:   Fluid accumulation appears to be related to high salt foods.  Last Metolazone  dose was 8/26 but no change in Optivol.  Copy sent to Harlene Gainer, NP for review and recommendations if needed.   Follow-up plan: ICM clinic phone appointment on 01/26/2024 (manual) to recheck fluid levels.   91 day device clinic remote transmission 03/13/2024.     EP/Cardiology Office Visits:  Recall 02/10/2024 with Dr Cherrie (4 month w/echo).  Recall 03/21/2024 with Dr Waddell.     Copy of ICM check sent to Dr. Waddell.   3 month ICM trend: 01/19/2024.    12-14 Month ICM trend:     Rhonda GORMAN Garner, RN 01/19/2024 9:32 AM

## 2024-01-19 NOTE — Telephone Encounter (Signed)
 Advanced Heart Failure Patient Advocate Encounter  Prior Authorization for Furoscix  has been approved.    PA# [id number] Effective dates: 05/26/23 through 05/24/24  Patients co-pay is $0 (5 kits)  Will mail from our specialty pharmacy.  [name] Pa, CPhT

## 2024-01-19 NOTE — Progress Notes (Signed)
 Specialty Pharmacy Initial Fill Coordination Note  Rhonda [name] is a 73 y.o. female contacted today regarding initial fill of specialty medication(s) Furosemide  (Furoscix )   Patient requested Delivery   Delivery date: 01/21/24   Verified address: [address] [place], [place], [address]   Medication will be filled on 08/27.   Patient is aware of $0 copayment.   [name] Pa, CPhT

## 2024-01-19 NOTE — Progress Notes (Signed)
 Medication Samples have been provided to the patient.  Drug name: Furoscix        Strength: 80mg  /10ml        Qty: 2  LOT: [id number]  Exp.Date: 04/23/25  Dosing instructions: take as directed by the heart failure clinic   The patient has been instructed regarding the correct time, dose, and frequency of taking this medication, including desired effects and most common side effects.   Kately Graffam M Erickson Yamashiro 2:30 PM 01/19/2024

## 2024-01-19 NOTE — Telephone Encounter (Signed)
 Patient Advocate Encounter   Received notification from [facility] that prior authorization for Furoscix  is required.   PA submitted on CoverMyMeds Key [id number] Status is pending   Will continue to follow.

## 2024-01-19 NOTE — Progress Notes (Signed)
fur

## 2024-01-19 NOTE — Progress Notes (Signed)
 Paramedicine Encounter    Patient ID: Rhonda [name], female    DOB: [birth year], 73 y.o.   MRN: [id number]   Complaints- none   Assessment- CAOx4, warm and dry seated in her chair in her room, no shortness of breath, no dizziness, no chest pain, mild lower leg swelling but no more than her baseline, weight down 2 lbs, lungs clear, vitals obtained as noted.   -optivol reports volume overload     Compliance with meds- no missed doses   Pill box filled- for one week   Refills needed- clonazepam    Meds changes since last visit-   TODAY- HOLD TORSEMIDE  x3 DAYS- TAKE FUROSCIX  x3 DAYS WITH POTASSIUM    Social changes- NONE    VISIT SUMMARY- Arrived for home visit for [name] who reports to be feeling well with no complaints. Weight is down 2 lbs from last week. Mild swelling in lower legs noted- no shortness of breath more than usual. Lungs clear. Vitals within normal.   Device clinic RN and I spoke today at length and noted her Optivol reading showed her volume overloaded and the need for increased diuretics. HF clinic notified and orders placed for furoscix  for three days along with education on diet and HF disease process.   I went out this afternoon to deliver the furoscix  from the clinic and educated on device and placed same at 16:19- will be removed at 21:19.   We also spent a long time discussing diet and I gave grocery list examples of things she should and should not be eating. Educated husband and reminded him that he can and should not be ordering or buying her sodium filled foods. He understood and admits he has been doing same and plans to improve on this.   I will follow up tomorrow via phone call to ensure all went well with Furosix. I also plan to see her in the clinic in one week. Her and her husband know to call me if any needs arise before then.   Visit complete.   BP 102/62   Pulse 94   Resp 18   Wt 203 lb (92.1 kg)   SpO2 98%   BMI 37.13 kg/m  Weight  yesterday-- 203LBS  Last visit weight-- 205LBS      ACTION: Home visit completed     Patient Care Team: Chrystal Lamarr GORMAN, MD as PCP - General (Family Medicine) Waddell Danelle ORN, MD as PCP - Electrophysiology (Cardiology) Cathern Andriette DEL, LCSW as Social Worker (Licensed Clinical Social Worker) Tobie, Tonita POUR, DO as Consulting Physician (Neurology) Jacques Moats, Paramedic as Paramedic Chrystal, Lamarr GORMAN, MD as Attending Physician (Family Medicine) Bensimhon, Toribio SAUNDERS, MD as Consulting Physician (Cardiology) Curry, Leni DASEN, MD as Consulting Physician (Psychiatry) Tobie Franky SQUIBB, DPM as Consulting Physician (Podiatry) Aniceto Daphne CROME, NP as Nurse Practitioner (Cardiology) Gaynel Delon CROME, DPM as Consulting Physician (Podiatry) Slusher, Santana LABOR, RN as Registered Nurse  Patient Active Problem List   Diagnosis Date Noted   Mild dementia 04/20/2023   Diabetic neuropathy    Elevated troponin 12/02/2021   Acute left ankle pain 10/11/2021   Hyponatremia 10/08/2021   Decreased estrogen level 08/01/2021   Hyperparathyroidism 08/01/2021   Spondylolisthesis 08/01/2021   Varicose veins of bilateral lower extremities with other complications 08/01/2021   Lumbago without sciatica 04/30/2021   Abnormal gait 06/11/2020   Allergic rhinitis 06/11/2020   Asthma 06/11/2020   Benign intracranial hypertension 06/11/2020   Body mass index (BMI) 45.0-49.9, adult  06/11/2020   Bowel incontinence 06/11/2020   Cholelithiasis 06/11/2020   Chronic sinusitis 06/11/2020   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   Daytime somnolence 06/11/2020   Edema 06/11/2020   Family history of malignant neoplasm of gastrointestinal tract 06/11/2020   Gout 06/11/2020   Insomnia 06/11/2020   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   Lumbar spondylosis with myelopathy 06/11/2020   Macrocytosis 06/11/2020   History of colonic polyps 06/11/2020   Repeated falls 06/11/2020   Irritable bowel syndrome 01/04/2020   Spinal  stenosis of lumbar region 01/03/2020   Bilateral leg weakness 08/01/2019   Type II diabetes mellitus 06/15/2019   Hypokalemia 11/08/2018   CKD (chronic kidney disease) stage 3, GFR 30-59 ml/min 11/08/2018   Orthostatic hypotension 07/28/2017   On home oxygen  therapy    Migraines    Left bundle branch block    Hypothyroidism    Hypertension    Hyperlipidemia    Heart murmur    GERD (gastroesophageal reflux disease)    Exertional shortness of breath    Major depressive disorder    Back pain    Arthritis    Generalized anxiety disorder    Anemia    Chronic respiratory failure 09/14/2013   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   Chronic systolic heart failure 10/27/2012   Endometrial polyp 01/20/2012   Malignant tumor of breast 03/26/2011   Vitamin D  deficiency 03/26/2011    Current Outpatient Medications:    acetaminophen  (TYLENOL ) 500 MG tablet, Take 500 mg by mouth every 6 (six) hours as needed., Disp: , Rfl:    acetaZOLAMIDE  (DIAMOX ) 125 MG tablet, Take 1 tablet (125 mg total) by mouth daily., Disp: 90 tablet, Rfl: 3   albuterol  (VENTOLIN  HFA) 108 (90 Base) MCG/ACT inhaler, INHALE TWO PUFFS BY MOUTH EVERY 4 HOURS AS NEEDED, Disp: , Rfl:    allopurinol  (ZYLOPRIM ) 100 MG tablet, TAKE 1/2 TABLET (50 MG TOTAL) BY MOUTH DAILY., Disp: 45 tablet, Rfl: 1   azelastine  (ASTELIN ) 0.1 % nasal spray, Place 2 sprays into both nostrils 2 (two) times daily. Use in each nostril as directed, Disp: 30 mL, Rfl: 12   buPROPion  (WELLBUTRIN  XL) 150 MG 24 hr tablet, Take 1 tablet (150 mg total) by mouth daily., Disp: 30 tablet, Rfl: 5   carvedilol  (COREG ) 3.125 MG tablet, TAKE 1 TABLET (3.125 MG) BY MOUTH TWICE DAILY WITH MEALS, Disp: 180 tablet, Rfl: 3   citalopram  (CELEXA ) 20 MG tablet, Take 1 tablet (20 mg total) by mouth daily., Disp: 30 tablet, Rfl: 5   clonazePAM  (KLONOPIN ) 0.5 MG tablet, Take 1 tablet (0.5 mg total) by mouth at bedtime., Disp: 30 tablet,  Rfl: 5   cyanocobalamin  (VITAMIN B12) 1000 MCG tablet, Take 1,000 mcg by mouth daily., Disp: , Rfl:    desloratadine  (CLARINEX ) 5 MG tablet, TAKE 1 TABLET (5 MG TOTAL) BY MOUTH DAILY FOR ALLERGIES, Disp: 90 tablet, Rfl: 3   dicyclomine  (BENTYL ) 20 MG tablet, Take 20 mg by mouth 3 (three) times daily before meals., Disp: , Rfl:    doxycycline  (VIBRAMYCIN ) 100 MG capsule, Take 1 capsule (100 mg total) by mouth 2 (two) times daily., Disp: 20 capsule, Rfl: 0   famotidine (PEPCID) 20 MG tablet, Take 20 mg by mouth 2 (two) times daily., Disp: , Rfl:    fluticasone  (FLONASE ) 50 MCG/ACT nasal spray, Place 1 spray into both nostrils daily., Disp: , Rfl:    Furosemide  (FUROSCIX ) 80 MG/10ML CTKT, Inject 80 mg into the skin daily as needed. As directed by the  heart failure clinic, Disp: 5 each, Rfl: 1   gabapentin  (NEURONTIN ) 100 MG capsule, TAKE 1 CAPSULE (100 MG TOTAL) BY MOUTH 2 (TWO) TIMES DAILY., Disp: 60 capsule, Rfl: 0   levothyroxine  (SYNTHROID ) 88 MCG tablet, TAKE 1 TABLET(88 MCG) BY MOUTH DAILY BEFORE BREAKFAST, Disp: 90 tablet, Rfl: 3   memantine  (NAMENDA ) 5 MG tablet, Take 1 tablet by mouth daily. (Patient taking differently: Take 1 tablet by mouth 2 (two) times daily.), Disp: , Rfl:    metolazone  (ZAROXOLYN ) 2.5 MG tablet, TAKE 1 TABLET BY MOUTH ONCE A WEEK, Disp: 4 tablet, Rfl: 1   midodrine  (PROAMATINE ) 5 MG tablet, Take 1 tablet (5 mg total) by mouth 3 (three) times daily., Disp: 200 tablet, Rfl: 3   Multiple Vitamin (MULTIVITAMIN WITH MINERALS) TABS tablet, Take 1 tablet by mouth daily., Disp: , Rfl:    nystatin (MYCOSTATIN) 100000 UNIT/ML suspension, Take 5 mLs by mouth 4 (four) times daily., Disp: , Rfl:    Ondansetron  HCl (ZOFRAN  PO), 4 mg. Take 1 tablet on the tongue and allow to dissolve, Disp: , Rfl:    phenol (CHLORASEPTIC) 1.4 % LIQD, Use as directed 1 spray in the mouth or throat as needed for throat irritation / pain., Disp: , Rfl:    potassium chloride  SA (KLOR-CON  M) 20 MEQ tablet,  Take 3 tablets (60 mEq total) by mouth 2 (two) times daily., Disp: 572 tablet, Rfl: 3   risperiDONE  (RISPERDAL ) 0.5 MG tablet, Take 1 tablet (0.5 mg total) by mouth 2 (two) times daily at 8 am and 4 pm., Disp: 60 tablet, Rfl: 5   simvastatin  (ZOCOR ) 10 MG tablet, TAKE 1 TABLET EVERY EVENING, Disp: 90 tablet, Rfl: 3   spironolactone  (ALDACTONE ) 25 MG tablet, TAKE 1 TABLET (25 MG TOTAL) BY MOUTH EVERY EVENING., Disp: 90 tablet, Rfl: 3   tirzepatide  (MOUNJARO ) 12.5 MG/0.5ML Pen, Inject 12.5 mg into the skin once a week., Disp: 2 mL, Rfl: 0   torsemide  (DEMADEX ) 100 MG tablet, Take 1 tablet (100 mg total) by mouth 2 (two) times daily., Disp: 180 tablet, Rfl: 3 Allergies  Allergen Reactions   Ceftin Anaphylaxis    Face and throat swell    Cefuroxime Axetil Anaphylaxis    Face and throat swell   Geodon [Ziprasidone Hcl] Hives   Lisinopril  Other (See Comments)    angioedema  lisinopril    Shellfish-Derived Products Other (See Comments)    Other reaction(s): Other  shellfish derived   Cefuroxime     Other reaction(s): anaphylaxis   Sulfacetamide Sodium-Sulfur      Other reaction(s): itch   Allopurinol  Nausea Only and Other (See Comments)    weakness   Ativan  [Lorazepam ] Itching   Diazepam  Other (See Comments)    Patient states that diazepam  doesn't relax, it has the opposite effect.  Valium    Sulfa Antibiotics Itching     Social History   Socioeconomic History   Marital status: Married    Spouse name: Dallas   Number of children: 2   Years of education: 18   Highest education level: Master's degree (e.g., MA, MS, MEng, MEd, MSW, MBA)  Occupational History   Occupation: retired  Tobacco Use   Smoking status: Every Day    Current packs/day: 0.00    Average packs/day: 0.5 packs/day for 26.0 years (13.0 ttl pk-yrs)    Types: Cigarettes    Start date: 03/24/1995    Last attempt to quit: 03/23/2021    Years since quitting: 2.8   Smokeless tobacco: Never  Vaping  Use   Vaping  status: Never Used  Substance and Sexual Activity   Alcohol use: No   Drug use: No   Sexual activity: Yes  Other Topics Concern   Not on file  Social History Narrative   Tobacco Use Cigarettes: Former Smoker, Quit in 2008   No Alcohol   No recreational drug use   Diet: Regular/Low Carb   Exercise: None   Occupation: disabled   Education: Company secretary, masters   Children: 2   Firearms: No   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   Two story home   Lives with spouse Dallas      Social Drivers of Health   Financial Resource Strain: Low Risk  (09/17/2023)   Overall Financial Resource Strain (CARDIA)    Difficulty of Paying Living Expenses: Not very hard  Food Insecurity: No Food Insecurity (11/01/2023)   Hunger Vital Sign    Worried About Running Out of Food in the Last Year: Never true    Ran Out of Food in the Last Year: Never true  Transportation Needs: No Transportation Needs (11/01/2023)   PRAPARE - Administrator, Civil Service (Medical): No    Lack of Transportation (Non-Medical): No  Physical Activity: Insufficiently Active (09/17/2023)   Exercise Vital Sign    Days of Exercise per Week: 1 day    Minutes of Exercise per Session: 20 min  Stress: Stress Concern Present (09/17/2023)   [name] of Occupational Health - Occupational Stress Questionnaire    Feeling of Stress : To some extent  Social Connections: Socially Integrated (09/17/2023)   Social Connection and Isolation Panel    Frequency of Communication with Friends and Family: Twice a week    Frequency of Social Gatherings with Friends and Family: Twice a week    Attends Religious Services: 1 to 4 times per year    Active Member of Clubs or Organizations: Patient unable to answer    Attends Banker Meetings: 1 to 4 times per year    Marital Status: Married  Catering manager Violence: Not At Risk (11/01/2023)   Humiliation, Afraid, Rape, and Kick questionnaire     Fear of Current or Ex-Partner: No    Emotionally Abused: No    Physically Abused: No    Sexually Abused: No    Physical Exam      Future Appointments  Date Time Provider Department Center  01/26/2024  7:10 AM CVD HVT DEVICE REMOTES [facility] H&V  01/26/2024  2:00 PM [facility] PA/NP SWING [facility] None  02/25/2024  9:00 AM Slusher, Santana LABOR, RN [facility] None  03/09/2024  3:20 PM Arfeen, Leni DASEN, MD [facility] None  03/13/2024  7:10 AM CVD HVT DEVICE REMOTES [facility] H&V  04/19/2024  1:15 PM Gaynel Delon CROME, DPM [facility]  04/26/2024  8:50 AM Tobie, Tonita K, DO [facility] None  06/12/2024  7:10 AM CVD HVT DEVICE REMOTES [facility] H&V  09/11/2024  7:10 AM CVD HVT DEVICE REMOTES [facility] H&V  12/11/2024  7:10 AM CVD HVT DEVICE REMOTES [facility] H&V  03/12/2025  7:10 AM CVD HVT DEVICE REMOTES [facility] H&V

## 2024-01-19 NOTE — Telephone Encounter (Addendum)
 Called patient and informed them that [name] will be out to her house this afternoon with some samples for her. Told them to make sure not to take her Torsemide  of Metolazone  for the next 3 days. Have her scheduled for next Wednesday afternoon in clinic Rx sent to [facility] and co-pay is $0. Patient is also aware of this   ----- Message from Harlene CHRISTELLA Gainer sent at 01/19/2024 11:13 AM EDT ----- OptiVOl suggests volume way up. Please arrange for Furoscix  + 40 KCL daily x 3 days (hold torsemide  and any metolazone  while using Furoscix ).  After 3 days, resume torsemide  100 mg bid with metolazone  once a week. She needs an APP follow up in 1-2 weeks, and a BMET in 1 week. I will CC our triage pool and [facility] with Paramedicine as well. Thanks! ----- Message ----- From: Armanda Mitzie RAMAN, RN Sent: 01/19/2024  10:43 AM EDT To: Harlene CHRISTELLA Gainer, FNP  Sent for review and recommendations if needed.  Thanks!

## 2024-01-20 NOTE — Progress Notes (Signed)
 [place], Rhonda HERO, FNP  Sejal Cofield, Mitzie RAMAN, RN; Jacques Moats, Paramedic; P [facility] Clinical Pool OptiVOl suggests volume way up. Please arrange for Furoscix  + 40 KCL daily x 3 days (hold torsemide  and any metolazone  while using Furoscix ).  After 3 days, resume torsemide  100 mg bid with metolazone  once a week. She needs an APP follow up in 1-2 weeks, and a BMET in 1 week. I will CC our triage pool and [facility] with Paramedicine as well. Thanks!

## 2024-01-20 NOTE — Progress Notes (Signed)
  Received: Yesterday Col[place], Emer CHRISTELLA, RN  [place], Harlene CHRISTELLA, FNP; Samil Mecham, Mitzie RAMAN, RN; Jacques Moats, Paramedic; [facility] Clinical Pool Have sent message to pharmacy for cost of Furoscix         Received: Nilsa Sergeant, Lisa CHRISTELLA, RN  [place], Lisa CHRISTELLA, RN; [place], Harlene CHRISTELLA, [place]; Chaunice Obie, Mitzie RAMAN, RN; Jacques Moats, Paramedic; [facility] Clinical Pool Cost of Furosicx is $0 for patient

## 2024-01-20 NOTE — Progress Notes (Signed)
  Received: Nilsa Mirza, Powell, Paramedic  [place], Harlene HERO, FNP; Syaire Saber, Mitzie RAMAN, RN; P [facility] Clinical Pool Noted- can I pick up samples of furoscix  in clinic today? I will be there around 230 for another apt. And can apply for her this afternoon.

## 2024-01-20 NOTE — Progress Notes (Signed)
 Spoke with [name] with [name].  She confirmed patient started first dosage of Furoscix  on 8/27. She will receive 2nd & 3rd dosages as ordered.  She helped patient and husband with grocery shopping choice and provided education regarding foods that are high in salt.

## 2024-01-23 DIAGNOSIS — I5032 Chronic diastolic (congestive) heart failure: Secondary | ICD-10-CM | POA: Diagnosis not present

## 2024-01-23 DIAGNOSIS — E039 Hypothyroidism, unspecified: Secondary | ICD-10-CM | POA: Diagnosis not present

## 2024-01-23 DIAGNOSIS — I130 Hypertensive heart and chronic kidney disease with heart failure and stage 1 through stage 4 chronic kidney disease, or unspecified chronic kidney disease: Secondary | ICD-10-CM | POA: Diagnosis not present

## 2024-01-23 DIAGNOSIS — E1140 Type 2 diabetes mellitus with diabetic neuropathy, unspecified: Secondary | ICD-10-CM | POA: Diagnosis not present

## 2024-01-23 DIAGNOSIS — N1832 Chronic kidney disease, stage 3b: Secondary | ICD-10-CM | POA: Diagnosis not present

## 2024-01-23 DIAGNOSIS — I5041 Acute combined systolic (congestive) and diastolic (congestive) heart failure: Secondary | ICD-10-CM | POA: Diagnosis not present

## 2024-01-25 ENCOUNTER — Telehealth (HOSPITAL_COMMUNITY): Payer: Self-pay

## 2024-01-25 ENCOUNTER — Encounter (HOSPITAL_COMMUNITY): Payer: Self-pay

## 2024-01-25 ENCOUNTER — Other Ambulatory Visit (HOSPITAL_COMMUNITY): Payer: Self-pay

## 2024-01-25 NOTE — Telephone Encounter (Signed)
 Mr. Sturgeon called me to inform me that Clifton never received her third dose of Furoscix  from [facility]. I will be in clinic today at 1030. I can pick up while there and take to her due to failure of delivery from [facility] pharmacy.    [name], EMT-Paramedic [phone] 01/25/2024

## 2024-01-25 NOTE — Progress Notes (Signed)
 Saw Rhonda [name] today as I had to pick up her Furoscix  from clinic and go out and place on her.    Weight in the home today- 195 Last weight-  203   She will be seen in clinic tomorrow- I will follow up there with her and complete med rec then based on any med changes.   [name], EMT-Paramedic [phone] 01/25/2024

## 2024-01-25 NOTE — Progress Notes (Signed)
 Medication Samples have been provided to the patient.  Drug name: Furoscix        Strength: 80mg  /10        Qty: 1  LOT: [id number]  Exp.Date: 10/22/24  Dosing instructions: take as directed by the heart failure clinic  The patient has been instructed regarding the correct time, dose, and frequency of taking this medication, including desired effects and most common side effects.   Jacquiline Zurcher M Jozsef Wescoat 10:31 AM 01/25/2024

## 2024-01-26 ENCOUNTER — Encounter (HOSPITAL_COMMUNITY): Payer: Self-pay

## 2024-01-26 ENCOUNTER — Other Ambulatory Visit (HOSPITAL_COMMUNITY): Payer: Self-pay

## 2024-01-26 ENCOUNTER — Ambulatory Visit (HOSPITAL_COMMUNITY): Payer: Self-pay | Admitting: Cardiology

## 2024-01-26 ENCOUNTER — Ambulatory Visit (INDEPENDENT_AMBULATORY_CARE_PROVIDER_SITE_OTHER)

## 2024-01-26 ENCOUNTER — Other Ambulatory Visit: Payer: Self-pay

## 2024-01-26 ENCOUNTER — Ambulatory Visit
Admission: RE | Admit: 2024-01-26 | Discharge: 2024-01-26 | Disposition: A | Discharge status: Home / Self Care | Source: Ambulatory Visit | Attending: Cardiology | Admitting: Cardiology

## 2024-01-26 VITALS — BP 108/60 | HR 76 | Wt 206.0 lb

## 2024-01-26 DIAGNOSIS — I428 Other cardiomyopathies: Secondary | ICD-10-CM | POA: Diagnosis not present

## 2024-01-26 DIAGNOSIS — I951 Orthostatic hypotension: Secondary | ICD-10-CM | POA: Diagnosis not present

## 2024-01-26 DIAGNOSIS — I872 Venous insufficiency (chronic) (peripheral): Secondary | ICD-10-CM | POA: Diagnosis not present

## 2024-01-26 DIAGNOSIS — Z87730 Personal history of (corrected) cleft lip and palate: Secondary | ICD-10-CM | POA: Diagnosis not present

## 2024-01-26 DIAGNOSIS — Z95810 Presence of automatic (implantable) cardiac defibrillator: Secondary | ICD-10-CM | POA: Diagnosis not present

## 2024-01-26 DIAGNOSIS — F418 Other specified anxiety disorders: Secondary | ICD-10-CM | POA: Diagnosis not present

## 2024-01-26 DIAGNOSIS — J9610 Chronic respiratory failure, unspecified whether with hypoxia or hypercapnia: Secondary | ICD-10-CM | POA: Insufficient documentation

## 2024-01-26 DIAGNOSIS — I130 Hypertensive heart and chronic kidney disease with heart failure and stage 1 through stage 4 chronic kidney disease, or unspecified chronic kidney disease: Secondary | ICD-10-CM | POA: Diagnosis not present

## 2024-01-26 DIAGNOSIS — Z6837 Body mass index (BMI) 37.0-37.9, adult: Secondary | ICD-10-CM | POA: Insufficient documentation

## 2024-01-26 DIAGNOSIS — I5022 Chronic systolic (congestive) heart failure: Secondary | ICD-10-CM

## 2024-01-26 DIAGNOSIS — N183 Chronic kidney disease, stage 3 unspecified: Secondary | ICD-10-CM | POA: Insufficient documentation

## 2024-01-26 DIAGNOSIS — Z853 Personal history of malignant neoplasm of breast: Secondary | ICD-10-CM | POA: Insufficient documentation

## 2024-01-26 DIAGNOSIS — Z79899 Other long term (current) drug therapy: Secondary | ICD-10-CM | POA: Insufficient documentation

## 2024-01-26 DIAGNOSIS — Z9221 Personal history of antineoplastic chemotherapy: Secondary | ICD-10-CM | POA: Insufficient documentation

## 2024-01-26 LAB — BASIC METABOLIC PANEL WITH GFR
Anion gap: 13 (ref 5–15)
BUN: 38 mg/dL — ABNORMAL HIGH (ref 8–23)
CO2: 22 mmol/L (ref 22–32)
Calcium: 9.6 mg/dL (ref 8.9–10.3)
Chloride: 100 mmol/L (ref 98–111)
Creatinine, Ser: 1.99 mg/dL — ABNORMAL HIGH (ref 0.44–1.00)
GFR, Estimated: 26 mL/min — ABNORMAL LOW (ref >60)
Glucose, Bld: 78 mg/dL (ref 70–99)
Potassium: 3.7 mmol/L (ref 3.5–5.1)
Sodium: 135 mmol/L (ref 135–145)

## 2024-01-26 LAB — BRAIN NATRIURETIC PEPTIDE: B Natriuretic Peptide: 29.3 pg/mL (ref 0.0–100.0)

## 2024-01-26 NOTE — Progress Notes (Signed)
 Paramedicine Encounter  Patient ID: Rhonda [name] [place], female, DOB: [birth year], 72 y.o.,  MRN: [id number]  Met patient in clinic today with provider.   Weight @ clinic- 206lbs Weight @ home- 195lbs (unclothed yesterday)  B/P- 108/60  P- 76   SP02- 98   REDS CLIP- 33%  Med changes-  none today until labs return   Social Changes- none   Met with [name] in clinic today where she was seen by [facility] whp reviewed REDS, Optivol and assessed patient with no outward physical signs of fluid retention- recent weight loss difficult to determine a dry weight. Labs will be obtained today to determine BNP for any potential med changes. She will have an ECHO next Wednesday at 2:00. I will follow up once labs returned and reviewed. I filled pill box for one week. I will see her in one week for regular scheduled visit pending lab results with med changes.    [name], EMT-Paramedic [phone] 01/26/2024

## 2024-01-26 NOTE — Patient Instructions (Signed)
 Medication Changes:  None, continue current medications  Lab Work:  Labs done today, your results will be available in MyChart, we will contact you for abnormal readings.   Testing/Procedures:  Your physician has requested that you have an echocardiogram. Echocardiography is a painless test that uses sound waves to create images of your heart. It provides your doctor with information about the size and shape of your heart and how well your heart's chambers and valves are working. This procedure takes approximately one hour. There are no restrictions for this procedure. Please do NOT wear cologne, perfume, aftershave, or lotions (deodorant is allowed). Please arrive 15 minutes prior to your appointment time.  Please note: We ask at that you not bring children with you during ultrasound (echo/ vascular) testing. Due to room size and safety concerns, children are not allowed in the ultrasound rooms during exams. Our front office staff cannot provide observation of children in our lobby area while testing is being conducted. An adult accompanying a patient to their appointment will only be allowed in the ultrasound room at the discretion of the ultrasound technician under special circumstances. We apologize for any inconvenience.  Special Instructions // Education:  Do the following things EVERYDAY: Weigh yourself in the morning before breakfast. Write it down and keep it in a log. Take your medicines as prescribed Eat low salt foods--Limit salt (sodium) to 2000 mg per day.  Stay as active as you can everyday Limit all fluids for the day to less than 2 liters   Follow-Up in: 3 months   At the [facility], you and your health needs are our priority. We have a designated team specialized in the treatment of Heart Failure. This Care Team includes your primary Heart Failure Specialized Cardiologist (physician), Advanced Practice Providers (APPs- Physician Assistants and Nurse  Practitioners), and Pharmacist who all work together to provide you with the care you need, when you need it.   You may see any of the following providers on your designated Care Team at your next follow up:  Dr. Toribio Fuel Dr. Ezra Shuck Dr. Ria Commander Dr. Odis Brownie Greig Mosses, NP Caffie Shed, [place] [facility], [place] Beckey Coe, NP [place] Lee, NP Tinnie Redman, PharmD   Please be sure to bring in all your medications bottles to every appointment.   Need to Contact Us :  If you have any questions or concerns before your next appointment please send us  a message through [facility] or call our office at [phone].    TO LEAVE A MESSAGE FOR THE NURSE SELECT OPTION 2, PLEASE LEAVE A MESSAGE INCLUDING: YOUR NAME DATE OF BIRTH CALL BACK NUMBER REASON FOR CALL**this is important as we prioritize the call backs  YOU WILL RECEIVE A CALL BACK THE SAME DAY AS LONG AS YOU CALL BEFORE 4:00 PM

## 2024-01-26 NOTE — Progress Notes (Signed)
 ReDS Vest / Clip - 01/26/24 1400       ReDS Vest / Clip   Station Marker A    Ruler Value 30.5    ReDS Value Range Low volume    ReDS Actual Value 33

## 2024-01-26 NOTE — Progress Notes (Signed)
 EPIC Encounter for ICM Monitoring  Patient Name: Rhonda [name] is a 73 y.o. female Date: 01/26/2024 Primary Care Physican: Rhonda Lamarr GORMAN, MD Primary Cardiologist: Rhonda [name] Electrophysiologist: Rhonda [name]: 98.5%   06/01/2022 Weight: 193 lbs  04/26/2023 Weight: 214 lbs Per EMT note 07/14/2023 Weight: 226 lbs 10/13/2023 Office Weight: 210 lbs 12/15/2023 Weight: 206.3 lbs per Rhonda [name], paramedic visit 01/12/2024 Weight: 205.8 lbs per Rhonda [name], paramedic visit  01/19/2024 Weight: 203 lbs  Clinical Status   Since 23-Jan-2024 AT/AF  5 Time in AT/AF  <0.1 hr/day (0.2%) Longest AT/AF  3 minutes         Transmission results reviewed.                 Optivol thoracic impedance suggesting possible fluid accumulation (started 7/30) improved after taking Furoscix  x 3 days (8/27-8/29) but has not returned to baseline.  Fluid index > normal threshold starting 8/4.   Prescribed:  Rhonda [name] with paramedicine program assists with meds.   Torsemide  100 mg 1 tablet(s) (100 mg total) by mouth twice a day  Potassium 20 meq 3 tablets (60 mEq total) by mouth 2 (twice) a day.   Metolazone  2.5mg  1 tablet by mouth once a week (every Tuesday). Spironolactone  25 mg take 1 tablet by mouth every evening.   Labs: 10/25/2023 Creatinine 1.78, BUN 46, Potassium 3.3, Sodium 136, GFR 30 10/13/2023 Creatinine 1.91, BUN 41, Potassium 3.5, Sodium 135, GFR 28 07/28/2023 Creatinine 2.06, BUN 52, Potassium 3.5, Sodium 135, eGFR 25 07/14/2023 Creatinine 1.91, BUN 47, Potassium 3.3, Sodium 134, eGFR 28 A complete set of results can be found in Results Review.   Recommendations:   Recommendations will be given at 9/3 HF clinic appt.     Follow-up plan: ICM clinic phone appointment on 02/02/2024 (manual) to recheck fluid levels.   91 day device clinic remote transmission 03/13/2024.     EP/Cardiology Office Visits:   01/26/2024 with HF clinic.   Recall 02/10/2024 with Dr Cherrie (4 month w/echo).   Recall 03/21/2024 with Dr Rhonda.     Copy of ICM check sent to Dr. Waddell.   3 month ICM trend: 01/26/2024.    12-14 Month ICM trend:     Rhonda [name] Garner, RN 01/26/2024 8:11 AM

## 2024-01-26 NOTE — Progress Notes (Signed)
 [facility]   Primary Cardiologist: Dr Shlomo  General Surgeon: Dr Ebbie  Orthopedic: Dr Ernie  PCP: Chrystal Lamarr RAMAN, MD HF Cardiologist: Dr. Cherrie  HPI: Rhonda [name] is a 73 y.o. 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 She has had sleep study with no evidence of sleep apnea in remote past.  She has a Medtronic CRT-D device. Echo in 7/18 showed recovery of EF to 55-60%. PYP 09/02/17 negative for TTR (Grade 0-1, H/CCL 1.2). SPEP with no M-spike.   Has been followed by Tonita Blanch in Neurology. Has been on Florinef  and midodrine  for orthostatic hypotension (failed mestinon).   Seen by Dr. Marlee and SCr slightly worse, so Florinef  stopped. Echo 1/23 EF 60-65%, RV ok  Follow up 2/23, SCr trending up and metolazone  changed to every other Tuesday.   Admitted 5/23 with AKI. Resuscitated with IVF, Florinef  and midodrine  restarted. SCr stabilized at 1.6-1.8.  Follow up 7/24, volume mildly up and BP elevated. Midodrine  decreased to 5 bid, repeat echo arranged.  Echo 12/30/22 EF 55-60% G1DD RV normal.   Pt presents to clinic today for volume assessment. She was recently noted on remote device interrogation to be significantly volume overloaded based on optivol.  This is in the setting of dietary indiscretion w/ sodium. Eating mostly take out/ high sodium meals. Uses [employer] Meals (mostly from [employer]). She was instructed to start Furoscix  + 40 KCL daily x 3 days (hold torsemide  and any metolazone  while using Furoscix ). After 3 days, resume torsemide  100 mg bid with metolazone  once a week.  She presents to clinic today w/ her  husband and paramedicine. She denies resting dyspnea. No orthopnea/PND. No LEE. Weights have not been reliable, as she has lost wt while being on GLP1i. Device interrogation shows Optivol fluid index still elevated above threshold but impedence appears to be drifting back up. She has also had several recent episodes of AF/AT, though very brief and < 10 min in duration. No VT. 100% BiV paced. Activity level low, < 1h/day.   ReDs 33%  BP 108/60. Denies orthostatic symptoms.    Cardiac Studies  - Echo (5/14): EF 35% RV ok  - Echo (11/14): EF 20-25%, LV moderately dilated - Echo (5/15): EF 45-50% - Echo (7/18): EF 55-60%, normal RV size and systolic function, PASP 34 mmHg - PYP (4/19): negative TTR - Echo (1/21): EF 60-65% grade II DD. RV ok - Echo (1/23): EF 60-65%, RV ok - Echo (8/24): EF 55-60%, G1DD, RV normal  Review of systems complete and found to be negative unless listed in HPI.   Past Medical History:  Diagnosis Date   Abnormal gait 06/11/2020   Acute left ankle pain 10/11/2021   AKI (acute kidney injury) 10/08/2021   Allergic rhinitis 06/11/2020   Anemia    Arthritis    Right knee   Asthma 06/11/2020   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   Back pain    Disk problem   Benign intracranial hypertension 06/11/2020   Bilateral leg weakness 08/01/2019   Biventricular ICD (implantable cardioverter-defibrillator) in place 08/04/2013   Body mass index (BMI) 45.0-49.9, adult 06/11/2020   Bowel incontinence 06/11/2020   Cardiomyopathy  Cholelithiasis 06/11/2020   Chronic respiratory failure 09/14/2013   Chronic sinusitis 06/11/2020   Chronic systolic heart failure 10/27/2012   a) NICM b) ECHO (03/2013) EF 20-25% c) ECHO (09/2013) EF 45-50%, grade I DD   CKD (chronic kidney disease) stage 3, GFR 30-59 ml/min 11/08/2018   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   Complication of anesthesia    History of low blood pressure after surgery; attributed to lying flat   Daytime somnolence 06/11/2020    Decreased estrogen level 08/01/2021   Diabetic neuropathy    Diarrhea of presumed infectious origin 06/11/2020   Edema 06/11/2020   Elevated troponin 12/02/2021   Endometrial polyp 01/20/2012   Exertional shortness of breath    Generalized anxiety disorder    GERD (gastroesophageal reflux disease)    Gout 06/11/2020   Heart murmur    History of colonic polyps 06/11/2020   History of fall 06/11/2020   Hyperlipidemia    Hyperparathyroidism 08/01/2021   Hypertension    Hypokalemia 11/08/2018   Hyponatremia 10/08/2021   Hypothyroidism    Insomnia 06/11/2020   Intractable nausea and vomiting 10/08/2021   Irritable bowel syndrome 01/04/2020   Left bundle branch block    s/p CRT-D (04/2013)   Lumbago without sciatica 04/30/2021   Lumbar spondylosis with myelopathy 06/11/2020   Macrocytosis 06/11/2020   Major depressive disorder    Malignant tumor of breast 03/26/2011   Left; completed chemotherapy and radiation treatments   Migraines    Mild dementia 04/20/2023   On home oxygen  therapy    2L suppose to be q night (05/03/2013)   Orthostatic hypotension 07/28/2017   Spinal stenosis of lumbar region 01/03/2020   Spondylolisthesis 08/01/2021   SVD (spontaneous vaginal delivery)    x 2   Type II diabetes mellitus 06/15/2019   Varicose veins of bilateral lower extremities with other complications 08/01/2021   Vitamin D  deficiency 03/26/2011   Current Outpatient Medications  Medication Sig Dispense Refill   acetaminophen  (TYLENOL ) 500 MG tablet Take 500 mg by mouth every 6 (six) hours as needed.     acetaZOLAMIDE  (DIAMOX ) 125 MG tablet Take 1 tablet (125 mg total) by mouth daily. 90 tablet 3   albuterol  (VENTOLIN  HFA) 108 (90 Base) MCG/ACT inhaler INHALE TWO PUFFS BY MOUTH EVERY 4 HOURS AS NEEDED     allopurinol  (ZYLOPRIM ) 100 MG tablet TAKE 1/2 TABLET (50 MG TOTAL) BY MOUTH DAILY. 45 tablet 1   azelastine  (ASTELIN ) 0.1 % nasal spray Place 2 sprays into both nostrils 2 (two)  times daily. Use in each nostril as directed 30 mL 12   buPROPion  (WELLBUTRIN  XL) 150 MG 24 hr tablet Take 1 tablet (150 mg total) by mouth daily. 30 tablet 5   carvedilol  (COREG ) 3.125 MG tablet TAKE 1 TABLET (3.125 MG) BY MOUTH TWICE DAILY WITH MEALS 180 tablet 3   citalopram  (CELEXA ) 20 MG tablet Take 1 tablet (20 mg total) by mouth daily. 30 tablet 5   clonazePAM  (KLONOPIN ) 0.5 MG tablet Take 1 tablet (0.5 mg total) by mouth at bedtime. 30 tablet 5   cyanocobalamin  (VITAMIN B12) 1000 MCG tablet Take 1,000 mcg by mouth daily.     desloratadine  (CLARINEX ) 5 MG tablet TAKE 1 TABLET (5 MG TOTAL) BY MOUTH DAILY FOR ALLERGIES 90 tablet 3   dicyclomine  (BENTYL ) 20 MG tablet Take 20 mg by mouth 3 (three) times daily before meals.     famotidine (PEPCID) 20 MG tablet Take 20 mg by mouth as needed for heartburn or indigestion.  fluticasone  (FLONASE ) 50 MCG/ACT nasal spray Place 1 spray into both nostrils daily.     Furosemide  (FUROSCIX ) 80 MG/10ML CTKT Inject 80 mg into the skin daily as needed. As directed by the heart failure clinic 5 each 1   gabapentin  (NEURONTIN ) 100 MG capsule TAKE 1 CAPSULE (100 MG TOTAL) BY MOUTH 2 (TWO) TIMES DAILY. 60 capsule 0   levothyroxine  (SYNTHROID ) 88 MCG tablet TAKE 1 TABLET(88 MCG) BY MOUTH DAILY BEFORE BREAKFAST 90 tablet 3   memantine  (NAMENDA ) 5 MG tablet Take 5 mg by mouth 2 (two) times daily.     metolazone  (ZAROXOLYN ) 2.5 MG tablet TAKE 1 TABLET BY MOUTH ONCE A WEEK 4 tablet 1   midodrine  (PROAMATINE ) 5 MG tablet Take 1 tablet (5 mg total) by mouth 3 (three) times daily. 200 tablet 3   Multiple Vitamin (MULTIVITAMIN WITH MINERALS) TABS tablet Take 1 tablet by mouth daily.     phenol (CHLORASEPTIC) 1.4 % LIQD Use as directed 1 spray in the mouth or throat as needed for throat irritation / pain.     potassium chloride  SA (KLOR-CON  M) 20 MEQ tablet Take 3 tablets (60 mEq total) by mouth 2 (two) times daily. 572 tablet 3   risperiDONE  (RISPERDAL ) 0.5 MG tablet  Take 1 tablet (0.5 mg total) by mouth 2 (two) times daily at 8 am and 4 pm. 60 tablet 5   simvastatin  (ZOCOR ) 10 MG tablet TAKE 1 TABLET EVERY EVENING 90 tablet 3   spironolactone  (ALDACTONE ) 25 MG tablet TAKE 1 TABLET (25 MG TOTAL) BY MOUTH EVERY EVENING. 90 tablet 3   tirzepatide  (MOUNJARO ) 12.5 MG/0.5ML Pen Inject 12.5 mg into the skin once a week. 2 mL 0   torsemide  (DEMADEX ) 100 MG tablet Take 1 tablet (100 mg total) by mouth 2 (two) times daily. 180 tablet 3   No current facility-administered medications for this encounter.   BP 108/60   Pulse 76   Wt 93.4 kg (206 lb)   SpO2 98%   BMI 37.68 kg/m   Wt Readings from Last 3 Encounters:  01/26/24 93.4 kg (206 lb)  01/25/24 88.5 kg (195 lb)  01/19/24 92.1 kg (203 lb)     GENERAL: chronically ill appearing, NAD Lungs- clear  CARDIAC:  JVP: 6-7 cm         Normal rate with regular rhythm. No edema  ABDOMEN: Soft, non-tender, non-distended.  EXTREMITIES: Warm and well perfused.  NEUROLOGIC: No obvious FND   Device interrogation (personally reviewed): Device interrogation shows Optivol fluid index still elevated above threshold but impedence appears to be drifting back up. She has also had several recent episodes of AF/AT, though very brief and < 10 min in duration. No VT. 100% BiV paced. Activity level low, < 1h/day.   ReDs: 33%, normal    ASSESSMENT & PLAN:  1) Chronic systolic HF with recovered EF/nonischemic cardiomyopathy:  - NICM s/p Medtronic CRT-D, ? Viral myocarditis. Cardiomyopathy dates from 52.  - Echo (5/15): EF 45-50% - Echo (2020): EF up to 55-60%.  - PYP (4/19) negative for TTR (Grade 0-1, H/CCL 1.2). SPEP with no M-spike.  - Echo (1/21): EF 60-65% grade II DD. RV ok.  - Echo (1/23): EF 60-65% RV normal.  - Echo 12/30/22: EF 55-60% G1DD RV normal.  - NYHA II-III, stable. Not very functional at baseline. Denies resting dyspnea. No orthopnea/PND or LEE.  - does not appear grossly volume overloaded on exam,  though device interrogation shows Optivol fluid index still elevated above threshold  but impedence appears to be drifting back up. ReDs 33% - transition back to PO diuretics, Torsemide  100 mg bid + once weekly metolazone , 2.5 mg q Tuedsays. Will check BNP today. If elevated above baseline, will increase metolazone  to 5 mg once wkly  - Recheck Optivol remotely in 1 wk  - Continue carvedilol  3.125 mg bid. - Continue spironolactone  25 mg daily.  - Continue midodrine  10 mg tid. - No SGT2i with chronic yeast infections. - C/w paramedicine. Appreciate assistance   - Check BMP today  - update echo   2) Obesity - Body mass index is 37.68 kg/m. - Now on GLP1RA, weight coming down.  3) HTN - controlled - Continue midorine at current dose for now  4) Orthostatic Hypotension.  - Neurology managing.  - She is on diamox  for increased ICP. - Continue midodrine  as above  - No change  5) Chronic Venous stasis - Encouraged her to use compression stockings   - No change  6) Falls  - No recent falls - Follows with Neurology.  - continue midodrine  support   7) CKD 3 - Baseline SCr 1.4-1.6 - Follows with Dr. Marlee in Nephrology - not on SGLT2i given h/o frequent yeast infections  - Check BMP today    Follow up in 3 months with Dr. Cherrie Caffie Shed, PA-C  2:17 PM

## 2024-01-27 ENCOUNTER — Other Ambulatory Visit (HOSPITAL_COMMUNITY): Payer: Self-pay

## 2024-02-01 ENCOUNTER — Other Ambulatory Visit: Payer: Self-pay

## 2024-02-02 ENCOUNTER — Encounter

## 2024-02-02 ENCOUNTER — Other Ambulatory Visit: Payer: Self-pay

## 2024-02-02 ENCOUNTER — Ambulatory Visit (HOSPITAL_COMMUNITY)
Admission: RE | Admit: 2024-02-02 | Discharge: 2024-02-02 | Disposition: A | Discharge status: Home / Self Care | Source: Ambulatory Visit | Attending: Family Medicine | Admitting: Family Medicine

## 2024-02-02 DIAGNOSIS — I5022 Chronic systolic (congestive) heart failure: Secondary | ICD-10-CM | POA: Diagnosis not present

## 2024-02-02 DIAGNOSIS — Z950 Presence of cardiac pacemaker: Secondary | ICD-10-CM | POA: Insufficient documentation

## 2024-02-02 DIAGNOSIS — I071 Rheumatic tricuspid insufficiency: Secondary | ICD-10-CM | POA: Diagnosis not present

## 2024-02-02 LAB — ECHOCARDIOGRAM COMPLETE
Area-P 1/2: 3.36 cm2
Calc EF: 61.1 %
S' Lateral: 2.8 cm
Single Plane A2C EF: 64.5 %
Single Plane A4C EF: 58 %

## 2024-02-03 ENCOUNTER — Other Ambulatory Visit (HOSPITAL_COMMUNITY): Payer: Self-pay | Admitting: Psychiatry

## 2024-02-03 ENCOUNTER — Other Ambulatory Visit (HOSPITAL_COMMUNITY): Payer: Self-pay

## 2024-02-03 DIAGNOSIS — F411 Generalized anxiety disorder: Secondary | ICD-10-CM

## 2024-02-03 DIAGNOSIS — F330 Major depressive disorder, recurrent, mild: Secondary | ICD-10-CM

## 2024-02-03 NOTE — Progress Notes (Signed)
 Paramedicine Encounter    Patient ID: Rhonda [name], female    DOB: [birth year], 73 y.o.   MRN: [id number]   Complaints- short of breath on exertion   Assessment- CAOX4, warm and dry seated in her living room, no lower leg edema, no abdominal distention, lungs clear, vitals within normal for her. Weight down one lbs since last visit. No dizziness, no chest pain.   Compliance with meds- no missed doses   Pill box filled- for one week   Refills needed- bentyl , bupropion , midodrine , citalopram    Meds changes since last visit- none     Social changes- none    VISIT SUMMARY- Arrived for home visit for Rhonda [name] who reports to be feeling okay today with no complaints stating that she is still getting short of breath on exertion but is ambulating well around the house without shortness of breath, lungs clear. No edema noted. No abdominal distention. I reviewed HF education with her and husband. Meds reviewed and confirmed. Pill box filled for one week. Refills will be called into [facility]. Appointments reviewed. Home visit complete. I will see her in one week.   BP 110/60   Pulse 70   Resp 18   Wt 205 lb (93 kg)   SpO2 98%   BMI 37.49 kg/m  Weight yesterday-- didn't weigh  Last visit weight-- 206lbs      ACTION: Home visit completed     Patient Care Team: Chrystal Lamarr GORMAN, MD as PCP - General (Family Medicine) Waddell Danelle ORN, MD as PCP - Electrophysiology (Cardiology) Cathern Andriette DEL, LCSW as Social Worker (Licensed Clinical Social Worker) Tobie, Tonita POUR, DO as Consulting Physician (Neurology) Jacques Moats, Paramedic as Paramedic Chrystal, Lamarr GORMAN, MD as Attending Physician (Family Medicine) Bensimhon, Toribio SAUNDERS, MD as Consulting Physician (Cardiology) Curry, Leni DASEN, MD as Consulting Physician (Psychiatry) Tobie Franky SQUIBB, DPM as Consulting Physician (Podiatry) Aniceto Daphne CROME, NP as Nurse Practitioner (Cardiology) Gaynel Delon CROME, DPM as Consulting Physician  (Podiatry) Slusher, Santana LABOR, RN as Registered Nurse  Patient Active Problem List   Diagnosis Date Noted   Mild dementia 04/20/2023   Diabetic neuropathy    Elevated troponin 12/02/2021   Acute left ankle pain 10/11/2021   Hyponatremia 10/08/2021   Decreased estrogen level 08/01/2021   Hyperparathyroidism 08/01/2021   Spondylolisthesis 08/01/2021   Varicose veins of bilateral lower extremities with other complications 08/01/2021   Lumbago without sciatica 04/30/2021   Abnormal gait 06/11/2020   Allergic rhinitis 06/11/2020   Asthma 06/11/2020   Benign intracranial hypertension 06/11/2020   Body mass index (BMI) 45.0-49.9, adult 06/11/2020   Bowel incontinence 06/11/2020   Cholelithiasis 06/11/2020   Chronic sinusitis 06/11/2020   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   Daytime somnolence 06/11/2020   Edema 06/11/2020   Family history of malignant neoplasm of gastrointestinal tract 06/11/2020   Gout 06/11/2020   Insomnia 06/11/2020   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   Lumbar spondylosis with myelopathy 06/11/2020   Macrocytosis 06/11/2020   History of colonic polyps 06/11/2020   Repeated falls 06/11/2020   Irritable bowel syndrome 01/04/2020   Spinal stenosis of lumbar region 01/03/2020   Bilateral leg weakness 08/01/2019   Type II diabetes mellitus 06/15/2019   Hypokalemia 11/08/2018   CKD (chronic kidney disease) stage 3, GFR 30-59 ml/min 11/08/2018   Orthostatic hypotension 07/28/2017   On home oxygen  therapy    Migraines    Left bundle branch block    Hypothyroidism    Hypertension    Hyperlipidemia  Heart murmur    GERD (gastroesophageal reflux disease)    Exertional shortness of breath    Major depressive disorder    Back pain    Arthritis    Generalized anxiety disorder    Anemia    Chronic respiratory failure 09/14/2013   Biventricular ICD (implantable cardioverter-defibrillator) in place 08/04/2013   Chronic systolic heart failure 10/27/2012   Endometrial polyp  01/20/2012   Malignant tumor of breast 03/26/2011   Vitamin D  deficiency 03/26/2011    Current Outpatient Medications:    acetaminophen  (TYLENOL ) 500 MG tablet, Take 500 mg by mouth every 6 (six) hours as needed., Disp: , Rfl:    acetaZOLAMIDE  (DIAMOX ) 125 MG tablet, Take 1 tablet (125 mg total) by mouth daily., Disp: 90 tablet, Rfl: 3   albuterol  (VENTOLIN  HFA) 108 (90 Base) MCG/ACT inhaler, INHALE TWO PUFFS BY MOUTH EVERY 4 HOURS AS NEEDED, Disp: , Rfl:    allopurinol  (ZYLOPRIM ) 100 MG tablet, TAKE 1/2 TABLET (50 MG TOTAL) BY MOUTH DAILY., Disp: 45 tablet, Rfl: 1   azelastine  (ASTELIN ) 0.1 % nasal spray, Place 2 sprays into both nostrils 2 (two) times daily. Use in each nostril as directed, Disp: 30 mL, Rfl: 12   buPROPion  (WELLBUTRIN  XL) 150 MG 24 hr tablet, Take 1 tablet (150 mg total) by mouth daily., Disp: 30 tablet, Rfl: 5   carvedilol  (COREG ) 3.125 MG tablet, TAKE 1 TABLET (3.125 MG) BY MOUTH TWICE DAILY WITH MEALS, Disp: 180 tablet, Rfl: 3   citalopram  (CELEXA ) 20 MG tablet, Take 1 tablet (20 mg total) by mouth daily., Disp: 30 tablet, Rfl: 5   clonazePAM  (KLONOPIN ) 0.5 MG tablet, Take 1 tablet (0.5 mg total) by mouth at bedtime., Disp: 30 tablet, Rfl: 5   cyanocobalamin  (VITAMIN B12) 1000 MCG tablet, Take 1,000 mcg by mouth daily., Disp: , Rfl:    desloratadine  (CLARINEX ) 5 MG tablet, TAKE 1 TABLET (5 MG TOTAL) BY MOUTH DAILY FOR ALLERGIES, Disp: 90 tablet, Rfl: 3   dicyclomine  (BENTYL ) 20 MG tablet, Take 20 mg by mouth 3 (three) times daily before meals., Disp: , Rfl:    famotidine (PEPCID) 20 MG tablet, Take 20 mg by mouth as needed for heartburn or indigestion., Disp: , Rfl:    fluticasone  (FLONASE ) 50 MCG/ACT nasal spray, Place 1 spray into both nostrils daily., Disp: , Rfl:    Furosemide  (FUROSCIX ) 80 MG/10ML CTKT, Inject 80 mg into the skin daily as needed. As directed by the heart failure clinic, Disp: 5 each, Rfl: 1   gabapentin  (NEURONTIN ) 100 MG capsule, TAKE 1 CAPSULE (100  MG TOTAL) BY MOUTH 2 (TWO) TIMES DAILY., Disp: 60 capsule, Rfl: 0   levothyroxine  (SYNTHROID ) 88 MCG tablet, TAKE 1 TABLET(88 MCG) BY MOUTH DAILY BEFORE BREAKFAST, Disp: 90 tablet, Rfl: 3   memantine  (NAMENDA ) 5 MG tablet, Take 5 mg by mouth 2 (two) times daily., Disp: , Rfl:    metolazone  (ZAROXOLYN ) 2.5 MG tablet, TAKE 1 TABLET BY MOUTH ONCE A WEEK, Disp: 4 tablet, Rfl: 1   midodrine  (PROAMATINE ) 5 MG tablet, Take 1 tablet (5 mg total) by mouth 3 (three) times daily., Disp: 200 tablet, Rfl: 3   Multiple Vitamin (MULTIVITAMIN WITH MINERALS) TABS tablet, Take 1 tablet by mouth daily., Disp: , Rfl:    phenol (CHLORASEPTIC) 1.4 % LIQD, Use as directed 1 spray in the mouth or throat as needed for throat irritation / pain., Disp: , Rfl:    potassium chloride  SA (KLOR-CON  M) 20 MEQ tablet, Take 3 tablets (60  mEq total) by mouth 2 (two) times daily., Disp: 572 tablet, Rfl: 3   risperiDONE  (RISPERDAL ) 0.5 MG tablet, Take 1 tablet (0.5 mg total) by mouth 2 (two) times daily at 8 am and 4 pm., Disp: 60 tablet, Rfl: 5   simvastatin  (ZOCOR ) 10 MG tablet, TAKE 1 TABLET EVERY EVENING, Disp: 90 tablet, Rfl: 3   spironolactone  (ALDACTONE ) 25 MG tablet, TAKE 1 TABLET (25 MG TOTAL) BY MOUTH EVERY EVENING., Disp: 90 tablet, Rfl: 3   tirzepatide  (MOUNJARO ) 12.5 MG/0.5ML Pen, Inject 12.5 mg into the skin once a week., Disp: 2 mL, Rfl: 0   torsemide  (DEMADEX ) 100 MG tablet, Take 1 tablet (100 mg total) by mouth 2 (two) times daily., Disp: 180 tablet, Rfl: 3 Allergies  Allergen Reactions   Ceftin Anaphylaxis    Face and throat swell    Cefuroxime Axetil Anaphylaxis    Face and throat swell   Geodon [Ziprasidone Hcl] Hives   Lisinopril  Other (See Comments)    angioedema  lisinopril    Shellfish-Derived Products Other (See Comments)    Other reaction(s): Other  shellfish derived   Cefuroxime     Other reaction(s): anaphylaxis   Sulfacetamide Sodium-Sulfur      Other reaction(s): itch   Allopurinol  Nausea Only  and Other (See Comments)    weakness   Ativan  [Lorazepam ] Itching   Diazepam  Other (See Comments)    Patient states that diazepam  doesn't relax, it has the opposite effect.  Valium    Sulfa Antibiotics Itching     Social History   Socioeconomic History   Marital status: Married    Spouse name: Dallas   Number of children: 2   Years of education: 18   Highest education level: Master's degree (e.g., MA, MS, MEng, MEd, MSW, MBA)  Occupational History   Occupation: retired  Tobacco Use   Smoking status: Every Day    Current packs/day: 0.00    Average packs/day: 0.5 packs/day for 26.0 years (13.0 ttl pk-yrs)    Types: Cigarettes    Start date: 03/24/1995    Last attempt to quit: 03/23/2021    Years since quitting: 2.8   Smokeless tobacco: Never  Vaping Use   Vaping status: Never Used  Substance and Sexual Activity   Alcohol use: No   Drug use: No   Sexual activity: Yes  Other Topics Concern   Not on file  Social History Narrative   Tobacco Use Cigarettes: Former Smoker, Quit in 2008   No Alcohol   No recreational drug use   Diet: Regular/Low Carb   Exercise: None   Occupation: disabled   Education: Company secretary, masters   Children: 2   Firearms: No   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   Two story home   Lives with spouse [place]      Social Drivers of Health   Financial Resource Strain: Low Risk  (09/17/2023)   Overall Financial Resource Strain (CARDIA)    Difficulty of Paying Living Expenses: Not very hard  Food Insecurity: No Food Insecurity (11/01/2023)   Hunger Vital Sign    Worried About Running Out of Food in the Last Year: Never true    Ran Out of Food in the Last Year: Never true  Transportation Needs: No Transportation Needs (11/01/2023)   PRAPARE - Administrator, Civil Service (Medical): No    Lack of Transportation (Non-Medical): No  Physical Activity: Insufficiently Active (09/17/2023)   Exercise Vital Sign  Days of Exercise per Week: 1 day    Minutes of Exercise per Session: 20 min  Stress: Stress Concern Present (09/17/2023)   [name] of Occupational Health - Occupational Stress Questionnaire    Feeling of Stress : To some extent  Social Connections: Socially Integrated (09/17/2023)   Social Connection and Isolation Panel    Frequency of Communication with Friends and Family: Twice a week    Frequency of Social Gatherings with Friends and Family: Twice a week    Attends Religious Services: 1 to 4 times per year    Active Member of Clubs or Organizations: Patient unable to answer    Attends Banker Meetings: 1 to 4 times per year    Marital Status: Married  Catering manager Violence: Not At Risk (11/01/2023)   Humiliation, Afraid, Rape, and Kick questionnaire    Fear of Current or Ex-Partner: No    Emotionally Abused: No    Physically Abused: No    Sexually Abused: No    Physical Exam      Future Appointments  Date Time Provider Department Center  02/24/2024  9:00 AM Slusher, Santana LABOR, RN [facility] None  03/09/2024  3:20 PM Arfeen, Leni DASEN, MD [facility] None  03/13/2024  7:10 AM [facility] HVT DEVICE REMOTES [facility]  04/19/2024  1:15 PM Gaynel Delon CROME, DPM [facility]  04/26/2024  8:50 AM Tobie Breslow K, DO [facility] None  06/12/2024  7:10 AM [facility] HVT DEVICE REMOTES [facility]  09/11/2024  7:10 AM [facility] HVT DEVICE REMOTES [facility]  12/11/2024  7:10 AM [facility] HVT DEVICE REMOTES [facility]  03/12/2025  7:10 AM [facility] HVT DEVICE REMOTES [facility]

## 2024-02-04 ENCOUNTER — Encounter: Payer: Self-pay | Admitting: Cardiology

## 2024-02-04 NOTE — Progress Notes (Signed)
 No ICM remote transmission received for 01/31/2024 and next ICM transmission scheduled for 02/09/2024.

## 2024-02-06 DIAGNOSIS — I5032 Chronic diastolic (congestive) heart failure: Secondary | ICD-10-CM | POA: Diagnosis not present

## 2024-02-06 DIAGNOSIS — N1832 Chronic kidney disease, stage 3b: Secondary | ICD-10-CM | POA: Diagnosis not present

## 2024-02-06 DIAGNOSIS — I5041 Acute combined systolic (congestive) and diastolic (congestive) heart failure: Secondary | ICD-10-CM | POA: Diagnosis not present

## 2024-02-06 DIAGNOSIS — E1140 Type 2 diabetes mellitus with diabetic neuropathy, unspecified: Secondary | ICD-10-CM | POA: Diagnosis not present

## 2024-02-09 ENCOUNTER — Ambulatory Visit: Attending: Internal Medicine

## 2024-02-09 DIAGNOSIS — Z95810 Presence of automatic (implantable) cardiac defibrillator: Secondary | ICD-10-CM

## 2024-02-09 DIAGNOSIS — I5022 Chronic systolic (congestive) heart failure: Secondary | ICD-10-CM

## 2024-02-10 ENCOUNTER — Other Ambulatory Visit (HOSPITAL_COMMUNITY): Payer: Self-pay

## 2024-02-10 NOTE — Progress Notes (Signed)
 Paramedicine Encounter    Patient ID: Rhonda [name] Potters, female    DOB: [birth year], 73 y.o.   MRN: [id number]   Complaints- headaches, fatigue and memory loss, forgetfulness   Assessment- CAOX4, warm and dry reporting to be feeling okay with some intermittent headaches and on going fatigue and memory loss/forgetfulness, no lower leg edema, lungs clear, vitals within normal.   Compliance with meds- missed a few doses of noon and evening   Pill box filled- for one week   Refills needed- gabapentin , clarinex , simvastatin    Meds changes since last visit- none     Social changes- none    VISIT SUMMARY- Arrived for home visit for Rhonda [name] who reports to be feeling well with intermittent forgetfulness, headaches and fatigue. Vitals obtained as noted. Weight down three lbs. No lower leg edema. Lungs clear. Abdomen soft non tender no JVD. Remote transmission completed. Assessment as noted. Meds reviewed and confirmed. Pill box filled for one week. I reviewed HF education with her and her husband. I plan to see her in one week. She agreed. Visit complete.   BP 120/72   Pulse 76   Resp 16   Wt 202 lb (91.6 kg)   SpO2 97%   BMI 36.95 kg/m  Weight yesterday-- didn't weigh  Last visit weight-- 205lbs      ACTION: Home visit completed     Patient Care Team: Chrystal Lamarr GORMAN, MD as PCP - General (Family Medicine) Waddell Danelle ORN, MD as PCP - Electrophysiology (Cardiology) Cathern Andriette DEL, LCSW as Social Worker (Licensed Clinical Social Worker) Tobie, Tonita POUR, DO as Consulting Physician (Neurology) Jacques Moats, Paramedic as Paramedic Chrystal, Lamarr GORMAN, MD as Attending Physician (Family Medicine) Bensimhon, Toribio SAUNDERS, MD as Consulting Physician (Cardiology) Curry, Leni DASEN, MD as Consulting Physician (Psychiatry) Tobie Franky SQUIBB, DPM as Consulting Physician (Podiatry) Aniceto Daphne CROME, NP as Nurse Practitioner (Cardiology) Gaynel Delon CROME, DPM as Consulting Physician  (Podiatry) Slusher, Santana LABOR, RN as Registered Nurse  Patient Active Problem List   Diagnosis Date Noted   Mild dementia 04/20/2023   Diabetic neuropathy    Elevated troponin 12/02/2021   Acute left ankle pain 10/11/2021   Hyponatremia 10/08/2021   Decreased estrogen level 08/01/2021   Hyperparathyroidism 08/01/2021   Spondylolisthesis 08/01/2021   Varicose veins of bilateral lower extremities with other complications 08/01/2021   Lumbago without sciatica 04/30/2021   Abnormal gait 06/11/2020   Allergic rhinitis 06/11/2020   Asthma 06/11/2020   Benign intracranial hypertension 06/11/2020   Body mass index (BMI) 45.0-49.9, adult 06/11/2020   Bowel incontinence 06/11/2020   Cholelithiasis 06/11/2020   Chronic sinusitis 06/11/2020   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   Daytime somnolence 06/11/2020   Edema 06/11/2020   Family history of malignant neoplasm of gastrointestinal tract 06/11/2020   Gout 06/11/2020   Insomnia 06/11/2020   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   Lumbar spondylosis with myelopathy 06/11/2020   Macrocytosis 06/11/2020   History of colonic polyps 06/11/2020   Repeated falls 06/11/2020   Irritable bowel syndrome 01/04/2020   Spinal stenosis of lumbar region 01/03/2020   Bilateral leg weakness 08/01/2019   Type II diabetes mellitus 06/15/2019   Hypokalemia 11/08/2018   CKD (chronic kidney disease) stage 3, GFR 30-59 ml/min 11/08/2018   Orthostatic hypotension 07/28/2017   On home oxygen  therapy    Migraines    Left bundle branch block    Hypothyroidism    Hypertension    Hyperlipidemia    Heart murmur    GERD (gastroesophageal reflux  disease)    Exertional shortness of breath    Major depressive disorder    Back pain    Arthritis    Generalized anxiety disorder    Anemia    Chronic respiratory failure 09/14/2013   Biventricular ICD (implantable cardioverter-defibrillator) in place 08/04/2013   Chronic systolic heart failure 10/27/2012   Endometrial polyp  01/20/2012   Malignant tumor of breast 03/26/2011   Vitamin D  deficiency 03/26/2011    Current Outpatient Medications:    acetaminophen  (TYLENOL ) 500 MG tablet, Take 500 mg by mouth every 6 (six) hours as needed., Disp: , Rfl:    acetaZOLAMIDE  (DIAMOX ) 125 MG tablet, Take 1 tablet (125 mg total) by mouth daily., Disp: 90 tablet, Rfl: 3   albuterol  (VENTOLIN  HFA) 108 (90 Base) MCG/ACT inhaler, INHALE TWO PUFFS BY MOUTH EVERY 4 HOURS AS NEEDED, Disp: , Rfl:    allopurinol  (ZYLOPRIM ) 100 MG tablet, TAKE 1/2 TABLET (50 MG TOTAL) BY MOUTH DAILY., Disp: 45 tablet, Rfl: 1   azelastine  (ASTELIN ) 0.1 % nasal spray, Place 2 sprays into both nostrils 2 (two) times daily. Use in each nostril as directed, Disp: 30 mL, Rfl: 12   buPROPion  (WELLBUTRIN  XL) 150 MG 24 hr tablet, Take 1 tablet (150 mg total) by mouth daily., Disp: 30 tablet, Rfl: 5   carvedilol  (COREG ) 3.125 MG tablet, TAKE 1 TABLET (3.125 MG) BY MOUTH TWICE DAILY WITH MEALS, Disp: 180 tablet, Rfl: 3   citalopram  (CELEXA ) 20 MG tablet, Take 1 tablet (20 mg total) by mouth daily., Disp: 30 tablet, Rfl: 5   clonazePAM  (KLONOPIN ) 0.5 MG tablet, Take 1 tablet (0.5 mg total) by mouth at bedtime., Disp: 30 tablet, Rfl: 5   cyanocobalamin  (VITAMIN B12) 1000 MCG tablet, Take 1,000 mcg by mouth daily., Disp: , Rfl:    desloratadine  (CLARINEX ) 5 MG tablet, TAKE 1 TABLET (5 MG TOTAL) BY MOUTH DAILY FOR ALLERGIES, Disp: 90 tablet, Rfl: 3   dicyclomine  (BENTYL ) 20 MG tablet, Take 20 mg by mouth 3 (three) times daily before meals., Disp: , Rfl:    famotidine (PEPCID) 20 MG tablet, Take 20 mg by mouth as needed for heartburn or indigestion., Disp: , Rfl:    fluticasone  (FLONASE ) 50 MCG/ACT nasal spray, Place 1 spray into both nostrils daily., Disp: , Rfl:    Furosemide  (FUROSCIX ) 80 MG/10ML CTKT, Inject 80 mg into the skin daily as needed. As directed by the heart failure clinic, Disp: 5 each, Rfl: 1   gabapentin  (NEURONTIN ) 100 MG capsule, TAKE 1 CAPSULE (100  MG TOTAL) BY MOUTH 2 (TWO) TIMES DAILY., Disp: 60 capsule, Rfl: 0   levothyroxine  (SYNTHROID ) 88 MCG tablet, TAKE 1 TABLET(88 MCG) BY MOUTH DAILY BEFORE BREAKFAST, Disp: 90 tablet, Rfl: 3   memantine  (NAMENDA ) 5 MG tablet, Take 5 mg by mouth 2 (two) times daily., Disp: , Rfl:    metolazone  (ZAROXOLYN ) 2.5 MG tablet, TAKE 1 TABLET BY MOUTH ONCE A WEEK, Disp: 4 tablet, Rfl: 1   midodrine  (PROAMATINE ) 5 MG tablet, Take 1 tablet (5 mg total) by mouth 3 (three) times daily., Disp: 200 tablet, Rfl: 3   Multiple Vitamin (MULTIVITAMIN WITH MINERALS) TABS tablet, Take 1 tablet by mouth daily., Disp: , Rfl:    phenol (CHLORASEPTIC) 1.4 % LIQD, Use as directed 1 spray in the mouth or throat as needed for throat irritation / pain., Disp: , Rfl:    potassium chloride  SA (KLOR-CON  M) 20 MEQ tablet, Take 3 tablets (60 mEq total) by mouth 2 (two) times daily.,  Disp: 572 tablet, Rfl: 3   risperiDONE  (RISPERDAL ) 0.5 MG tablet, Take 1 tablet (0.5 mg total) by mouth 2 (two) times daily at 8 am and 4 pm., Disp: 60 tablet, Rfl: 5   simvastatin  (ZOCOR ) 10 MG tablet, TAKE 1 TABLET EVERY EVENING, Disp: 90 tablet, Rfl: 3   spironolactone  (ALDACTONE ) 25 MG tablet, TAKE 1 TABLET (25 MG TOTAL) BY MOUTH EVERY EVENING., Disp: 90 tablet, Rfl: 3   tirzepatide  (MOUNJARO ) 12.5 MG/0.5ML Pen, Inject 12.5 mg into the skin once a week., Disp: 2 mL, Rfl: 0   torsemide  (DEMADEX ) 100 MG tablet, Take 1 tablet (100 mg total) by mouth 2 (two) times daily., Disp: 180 tablet, Rfl: 3 Allergies  Allergen Reactions   Ceftin Anaphylaxis    Face and throat swell    Cefuroxime Axetil Anaphylaxis    Face and throat swell   Geodon [Ziprasidone Hcl] Hives   Lisinopril  Other (See Comments)    angioedema  lisinopril    Shellfish-Derived Products Other (See Comments)    Other reaction(s): Other  shellfish derived   Cefuroxime     Other reaction(s): anaphylaxis   Sulfacetamide Sodium-Sulfur      Other reaction(s): itch   Allopurinol  Nausea Only  and Other (See Comments)    weakness   Ativan  [Lorazepam ] Itching   Diazepam  Other (See Comments)    Patient states that diazepam  doesn't relax, it has the opposite effect.  Valium    Sulfa Antibiotics Itching     Social History   Socioeconomic History   Marital status: Married    Spouse name: Dallas   Number of children: 2   Years of education: 18   Highest education level: Master's degree (e.g., MA, MS, MEng, MEd, MSW, MBA)  Occupational History   Occupation: retired  Tobacco Use   Smoking status: Every Day    Current packs/day: 0.00    Average packs/day: 0.5 packs/day for 26.0 years (13.0 ttl pk-yrs)    Types: Cigarettes    Start date: 03/24/1995    Last attempt to quit: 03/23/2021    Years since quitting: 2.8   Smokeless tobacco: Never  Vaping Use   Vaping status: Never Used  Substance and Sexual Activity   Alcohol use: No   Drug use: No   Sexual activity: Yes  Other Topics Concern   Not on file  Social History Narrative   Tobacco Use Cigarettes: Former Smoker, Quit in 2008   No Alcohol   No recreational drug use   Diet: Regular/Low Carb   Exercise: None   Occupation: disabled   Education: Company secretary, masters   Children: 2   Firearms: No   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   Two story home   Lives with spouse [place]      Social Drivers of Health   Financial Resource Strain: Low Risk  (09/17/2023)   Overall Financial Resource Strain (CARDIA)    Difficulty of Paying Living Expenses: Not very hard  Food Insecurity: No Food Insecurity (11/01/2023)   Hunger Vital Sign    Worried About Running Out of Food in the Last Year: Never true    Ran Out of Food in the Last Year: Never true  Transportation Needs: No Transportation Needs (11/01/2023)   PRAPARE - Administrator, Civil Service (Medical): No    Lack of Transportation (Non-Medical): No  Physical Activity: Insufficiently Active (09/17/2023)   Exercise Vital Sign     Days of Exercise per  Week: 1 day    Minutes of Exercise per Session: 20 min  Stress: Stress Concern Present (09/17/2023)   [name] of Occupational Health - Occupational Stress Questionnaire    Feeling of Stress : To some extent  Social Connections: Socially Integrated (09/17/2023)   Social Connection and Isolation Panel    Frequency of Communication with Friends and Family: Twice a week    Frequency of Social Gatherings with Friends and Family: Twice a week    Attends Religious Services: 1 to 4 times per year    Active Member of Clubs or Organizations: Patient unable to answer    Attends Banker Meetings: 1 to 4 times per year    Marital Status: Married  Catering manager Violence: Not At Risk (11/01/2023)   Humiliation, Afraid, Rape, and Kick questionnaire    Fear of Current or Ex-Partner: No    Emotionally Abused: No    Physically Abused: No    Sexually Abused: No    Physical Exam      Future Appointments  Date Time Provider Department Center  02/21/2024  7:30 AM [facility] H[facility] DEVICE REMOTES [facility]  02/24/2024  9:00 AM Slusher, Santana LABOR, RN [facility] None  03/09/2024  3:20 PM Arfeen, Leni DASEN, MD [facility] None  03/13/2024  7:10 AM [facility] H[facility] DEVICE REMOTES [facility]  04/19/2024  1:15 PM Gaynel Delon CROME, DPM [facility]  04/26/2024  8:50 AM Tobie, Tonita K, DO [facility] None  06/12/2024  7:10 AM [facility] HVT DEVICE REMOTES [facility]  09/11/2024  7:10 AM [facility] H[facility] DEVICE REMOTES [facility]  12/11/2024  7:10 AM [facility] HVT DEVICE REMOTES [facility]  03/12/2025  7:10 AM [facility] H[facility] DEVICE REMOTES [facility]

## 2024-02-11 NOTE — Progress Notes (Signed)
  Received: Today Oliver, Harlene HERO, FNP  Tahnee Cifuentes, Mitzie RAMAN, RN; [facility] Clinical Pool Please arrange for Furoscix  + 40 KCL daily x 3 days (hold torsemide  and any metolazone  while using Furoscix ).  After 3 days, resume torsemide  100 mg bid with metolazone  once a week. She needs an APP follow up in 1-2 weeks, and a BMET in 1 week.  I will CC our HF team to help arrange

## 2024-02-11 NOTE — Progress Notes (Signed)
 EPIC Encounter for ICM Monitoring  Patient Name: Rhonda [name] is a 73 y.o. female Date: 02/11/2024 Primary Care Physican: Chrystal Lamarr GORMAN, MD Primary Cardiologist: Bensimhon Electrophysiologist: Waddell Pore Pacing: 98.4%   06/01/2022 Weight: 193 lbs  04/26/2023 Weight: 214 lbs Per EMT note 07/14/2023 Weight: 226 lbs 10/13/2023 Office Weight: 210 lbs 12/15/2023 Weight: 206.3 lbs per [name], paramedic visit 01/12/2024 Weight: 205.8 lbs per [name], paramedic visit  01/19/2024 Weight: 203 lbs 02/10/2024 Weight: 202 lbs per [name]   Clinical Status Since 03-Sep-20255 AT/AF  6 Time in AT/AF  <0.1 hr/day (<0.1%) Longest AT/AF  71 seconds         Spoke with husband and heart failure questions reviewed.  Transmission results reviewed.  Pt confirmed patient has missed a few evening doses within the last week and had discussed with [name], [name].   Per 9/18 paramedicine visit, patient missed a few noon and evening doses in the past week.                Optivol thoracic impedance suggesting possible fluid accumulation starting 9/3 and returned close to baseline 9/18.   Fluid index continues to be > normal threshold starting 8/4.   Prescribed:  [name] with paramedicine program assists with meds.   Torsemide  100 mg 1 tablet(s) (100 mg total) by mouth twice a day  Potassium 20 meq 3 tablets (60 mEq total) by mouth 2 (twice) a day.   Metolazone  2.5 mg 1 tablet by mouth once a week (every Tuesday). Spironolactone  25 mg take 1 tablet by mouth every evening.   Labs: 01/26/2024 Creatinine 1.99, BUN 38, Potassium 3.7, Sodium 135, GFR 26, BNP 29.3 10/25/2023 Creatinine 1.78, BUN 46, Potassium 3.3, Sodium 136, GFR 30 10/13/2023 Creatinine 1.91, BUN 41, Potassium 3.5, Sodium 135, GFR 28, BNP 37.5 07/28/2023 Creatinine 2.06, BUN 52, Potassium 3.5, Sodium 135, eGFR 25 07/14/2023 Creatinine 1.91, BUN 47, Potassium 3.3, Sodium 134,  eGFR 28, BNP 71.5 A complete set of results can be found in Results Review.   Recommendations: Copy sent to Harlene Gainer, NP at [facility] clinic for review and recommendation.     Follow-up plan: ICM clinic phone appointment on 02/23/2024.   91 day device clinic remote transmission 03/13/2024.     EP/Cardiology Office Visits:   Recall 05/11/2024 with Dr Cherrie.  Recall 03/21/2024 with Dr Waddell.     Copy of ICM check sent to Dr. Waddell.   3 month ICM trend: 02/10/2024.    12-14 Month ICM trend:     [name], RN 02/11/2024 7:49 AM

## 2024-02-11 NOTE — Progress Notes (Signed)
 Attempted call to [name], Paramedicine progam and there is an update on meds.  Left call back number.   HF clinic will arrange for patient to have Furoscix .

## 2024-02-14 ENCOUNTER — Telehealth (HOSPITAL_COMMUNITY): Payer: Self-pay

## 2024-02-14 ENCOUNTER — Other Ambulatory Visit (HOSPITAL_COMMUNITY): Payer: Self-pay

## 2024-02-14 NOTE — Telephone Encounter (Signed)
 Per HF clinic:   Received: Today [place], Harlene HERO, FNP  Short, Mitzie RAMAN, RN; [facility] Clinical Pool Please arrange for Furoscix  + 40 KCL daily x 3 days (hold torsemide  and any metolazone  while using Furoscix ).  After 3 days, resume torsemide  100 mg bid with metolazone  once a week. She needs an APP follow up in 1-2 weeks, and a BMET in 1 week.  I will CC our HF team to help arrange    I will follow up this afternoon to place furoscix  and the next two days.   [name], EMT-Paramedic [phone] 02/14/2024

## 2024-02-15 ENCOUNTER — Other Ambulatory Visit (HOSPITAL_COMMUNITY): Payer: Self-pay

## 2024-02-15 NOTE — Progress Notes (Signed)
 Paramedicine Encounter    Patient ID: Gwen GORMAN Potters, female    DOB: Dec 30, 1950, 73 y.o.   MRN: [id number]   Met with Mrs. Venier in her home today and applied ordered furoscix  at 1755. Vitals as noted and I plan to follow up tomorrow with second furoscix . She had no complaints and denied any symptoms.     ACTION: Home visit completed

## 2024-02-15 NOTE — Progress Notes (Signed)
 Paramedicine Encounter    Patient ID: Rhonda [name], female    DOB: 1950/09/27, 73 y.o.   MRN: [id number]   VISIT SUMMARY- Arrived and obtained vitals and weight- weight is down 2 lbs. She reports she has not urinated much with furoscix  last night. She also reports she has not peed much today. I placed second Furoscix  today at 1700. Husband will take off tonight at 10. Visit complete. I will follow up tomorrow.    BP 132/80   Pulse 77   Resp 16   Wt 204 lb (92.5 kg)   SpO2 99%   BMI 37.31 kg/m  Weight yesterday-- 206lbs  Last visit weight-204lbs      ACTION: Home visit completed\  [name], EMT-Paramedic [phone] 02/15/2024    Patient Care Team: Chrystal Lamarr GORMAN, MD as PCP - General (Family Medicine) Waddell Danelle ORN, MD as PCP - Electrophysiology (Cardiology) Cathern Andriette DEL, LCSW as Social Worker (Licensed Clinical Social Worker) Tobie, Tonita POUR, DO as Consulting Physician (Neurology) Mirza Powell, Paramedic as Paramedic Chrystal, Lamarr GORMAN, MD as Attending Physician (Family Medicine) Bensimhon, Toribio SAUNDERS, MD as Consulting Physician (Cardiology) Curry, Leni DASEN, MD as Consulting Physician (Psychiatry) Tobie Franky SQUIBB, DPM as Consulting Physician (Podiatry) Aniceto Daphne CROME, NP as Nurse Practitioner (Cardiology) Gaynel Delon CROME, DPM as Consulting Physician (Podiatry) Slusher, Santana LABOR, RN as Registered Nurse  Patient Active Problem List   Diagnosis Date Noted   Mild dementia 04/20/2023   Diabetic neuropathy    Elevated troponin 12/02/2021   Acute left ankle pain 10/11/2021   Hyponatremia 10/08/2021   Decreased estrogen level 08/01/2021   Hyperparathyroidism 08/01/2021   Spondylolisthesis 08/01/2021   Varicose veins of bilateral lower extremities with other complications 08/01/2021   Lumbago without sciatica 04/30/2021   Abnormal gait 06/11/2020   Allergic rhinitis 06/11/2020   Asthma 06/11/2020   Benign intracranial hypertension 06/11/2020    Body mass index (BMI) 45.0-49.9, adult 06/11/2020   Bowel incontinence 06/11/2020   Cholelithiasis 06/11/2020   Chronic sinusitis 06/11/2020   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   Daytime somnolence 06/11/2020   Edema 06/11/2020   Family history of malignant neoplasm of gastrointestinal tract 06/11/2020   Gout 06/11/2020   Insomnia 06/11/2020   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   Lumbar spondylosis with myelopathy 06/11/2020   Macrocytosis 06/11/2020   History of colonic polyps 06/11/2020   Repeated falls 06/11/2020   Irritable bowel syndrome 01/04/2020   Spinal stenosis of lumbar region 01/03/2020   Bilateral leg weakness 08/01/2019   Type II diabetes mellitus 06/15/2019   Hypokalemia 11/08/2018   CKD (chronic kidney disease) stage 3, GFR 30-59 ml/min 11/08/2018   Orthostatic hypotension 07/28/2017   On home oxygen  therapy    Migraines    Left bundle branch block    Hypothyroidism    Hypertension    Hyperlipidemia    Heart murmur    GERD (gastroesophageal reflux disease)    Exertional shortness of breath    Major depressive disorder    Back pain    Arthritis    Generalized anxiety disorder    Anemia    Chronic respiratory failure 09/14/2013   Biventricular ICD (implantable cardioverter-defibrillator) in place 08/04/2013   Chronic systolic heart failure 10/27/2012   Endometrial polyp 01/20/2012   Malignant tumor of breast 03/26/2011   Vitamin D  deficiency 03/26/2011    Current Outpatient Medications:    acetaminophen  (TYLENOL ) 500 MG tablet, Take 500 mg by mouth every 6 (six) hours as needed., Disp: , Rfl:  acetaZOLAMIDE  (DIAMOX ) 125 MG tablet, Take 1 tablet (125 mg total) by mouth daily., Disp: 90 tablet, Rfl: 3   albuterol  (VENTOLIN  HFA) 108 (90 Base) MCG/ACT inhaler, INHALE TWO PUFFS BY MOUTH EVERY 4 HOURS AS NEEDED, Disp: , Rfl:    allopurinol  (ZYLOPRIM ) 100 MG tablet, TAKE 1/2 TABLET (50 MG TOTAL) BY MOUTH DAILY., Disp: 45 tablet, Rfl: 1   azelastine  (ASTELIN ) 0.1 % nasal  spray, Place 2 sprays into both nostrils 2 (two) times daily. Use in each nostril as directed, Disp: 30 mL, Rfl: 12   buPROPion  (WELLBUTRIN  XL) 150 MG 24 hr tablet, Take 1 tablet (150 mg total) by mouth daily., Disp: 30 tablet, Rfl: 5   carvedilol  (COREG ) 3.125 MG tablet, TAKE 1 TABLET (3.125 MG) BY MOUTH TWICE DAILY WITH MEALS, Disp: 180 tablet, Rfl: 3   citalopram  (CELEXA ) 20 MG tablet, Take 1 tablet (20 mg total) by mouth daily., Disp: 30 tablet, Rfl: 5   clonazePAM  (KLONOPIN ) 0.5 MG tablet, Take 1 tablet (0.5 mg total) by mouth at bedtime., Disp: 30 tablet, Rfl: 5   cyanocobalamin  (VITAMIN B12) 1000 MCG tablet, Take 1,000 mcg by mouth daily., Disp: , Rfl:    desloratadine  (CLARINEX ) 5 MG tablet, TAKE 1 TABLET (5 MG TOTAL) BY MOUTH DAILY FOR ALLERGIES, Disp: 90 tablet, Rfl: 3   dicyclomine  (BENTYL ) 20 MG tablet, Take 20 mg by mouth 3 (three) times daily before meals., Disp: , Rfl:    famotidine (PEPCID) 20 MG tablet, Take 20 mg by mouth as needed for heartburn or indigestion., Disp: , Rfl:    fluticasone  (FLONASE ) 50 MCG/ACT nasal spray, Place 1 spray into both nostrils daily., Disp: , Rfl:    Furosemide  (FUROSCIX ) 80 MG/10ML CTKT, Inject 80 mg into the skin daily as needed. As directed by the heart failure clinic, Disp: 5 each, Rfl: 1   gabapentin  (NEURONTIN ) 100 MG capsule, TAKE 1 CAPSULE (100 MG TOTAL) BY MOUTH 2 (TWO) TIMES DAILY., Disp: 60 capsule, Rfl: 0   levothyroxine  (SYNTHROID ) 88 MCG tablet, TAKE 1 TABLET(88 MCG) BY MOUTH DAILY BEFORE BREAKFAST, Disp: 90 tablet, Rfl: 3   memantine  (NAMENDA ) 5 MG tablet, Take 5 mg by mouth 2 (two) times daily., Disp: , Rfl:    metolazone  (ZAROXOLYN ) 2.5 MG tablet, TAKE 1 TABLET BY MOUTH ONCE A WEEK, Disp: 4 tablet, Rfl: 1   midodrine  (PROAMATINE ) 5 MG tablet, Take 1 tablet (5 mg total) by mouth 3 (three) times daily., Disp: 200 tablet, Rfl: 3   Multiple Vitamin (MULTIVITAMIN WITH MINERALS) TABS tablet, Take 1 tablet by mouth daily., Disp: , Rfl:     phenol (CHLORASEPTIC) 1.4 % LIQD, Use as directed 1 spray in the mouth or throat as needed for throat irritation / pain., Disp: , Rfl:    potassium chloride  SA (KLOR-CON  M) 20 MEQ tablet, Take 3 tablets (60 mEq total) by mouth 2 (two) times daily., Disp: 572 tablet, Rfl: 3   risperiDONE  (RISPERDAL ) 0.5 MG tablet, Take 1 tablet (0.5 mg total) by mouth 2 (two) times daily at 8 am and 4 pm., Disp: 60 tablet, Rfl: 5   simvastatin  (ZOCOR ) 10 MG tablet, TAKE 1 TABLET EVERY EVENING, Disp: 90 tablet, Rfl: 3   spironolactone  (ALDACTONE ) 25 MG tablet, TAKE 1 TABLET (25 MG TOTAL) BY MOUTH EVERY EVENING., Disp: 90 tablet, Rfl: 3   tirzepatide  (MOUNJARO ) 12.5 MG/0.5ML Pen, Inject 12.5 mg into the skin once a week., Disp: 2 mL, Rfl: 0   torsemide  (DEMADEX ) 100 MG tablet, Take 1 tablet (  100 mg total) by mouth 2 (two) times daily., Disp: 180 tablet, Rfl: 3 Allergies  Allergen Reactions   Ceftin Anaphylaxis    Face and throat swell    Cefuroxime Axetil Anaphylaxis    Face and throat swell   Geodon [Ziprasidone Hcl] Hives   Lisinopril  Other (See Comments)    angioedema  lisinopril    Shellfish-Derived Products Other (See Comments)    Other reaction(s): Other  shellfish derived   Cefuroxime     Other reaction(s): anaphylaxis   Sulfacetamide Sodium-Sulfur      Other reaction(s): itch   Allopurinol  Nausea Only and Other (See Comments)    weakness   Ativan  [Lorazepam ] Itching   Diazepam  Other (See Comments)    Patient states that diazepam  doesn't relax, it has the opposite effect.  Valium    Sulfa Antibiotics Itching     Social History   Socioeconomic History   Marital status: Married    Spouse name: [place]   Number of children: 2   Years of education: 18   Highest education level: Master's degree (e.g., MA, MS, MEng, MEd, MSW, MBA)  Occupational History   Occupation: retired  Tobacco Use   Smoking status: Every Day    Current packs/day: 0.00    Average packs/day: 0.5 packs/day for 26.0  years (13.0 ttl pk-yrs)    Types: Cigarettes    Start date: 03/24/1995    Last attempt to quit: 03/23/2021    Years since quitting: 2.9   Smokeless tobacco: Never  Vaping Use   Vaping status: Never Used  Substance and Sexual Activity   Alcohol use: No   Drug use: No   Sexual activity: Yes  Other Topics Concern   Not on file  Social History Narrative   Tobacco Use Cigarettes: Former Smoker, Quit in 2008   No Alcohol   No recreational drug use   Diet: Regular/Low Carb   Exercise: None   Occupation: disabled   Education: Company secretary, masters   Children: 2   Firearms: No   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   Two story home   Lives with spouse [place]      Social Drivers of Health   Financial Resource Strain: Low Risk  (09/17/2023)   Overall Financial Resource Strain (CARDIA)    Difficulty of Paying Living Expenses: Not very hard  Food Insecurity: No Food Insecurity (11/01/2023)   Hunger Vital Sign    Worried About Running Out of Food in the Last Year: Never true    Ran Out of Food in the Last Year: Never true  Transportation Needs: No Transportation Needs (11/01/2023)   PRAPARE - Administrator, Civil Service (Medical): No    Lack of Transportation (Non-Medical): No  Physical Activity: Insufficiently Active (09/17/2023)   Exercise Vital Sign    Days of Exercise per Week: 1 day    Minutes of Exercise per Session: 20 min  Stress: Stress Concern Present (09/17/2023)   [name] of Occupational Health - Occupational Stress Questionnaire    Feeling of Stress : To some extent  Social Connections: Socially Integrated (09/17/2023)   Social Connection and Isolation Panel    Frequency of Communication with Friends and Family: Twice a week    Frequency of Social Gatherings with Friends and Family: Twice a week    Attends Religious Services: 1 to 4 times per year    Active Member of Clubs or Organizations: Patient unable to answer  Attends Club or Organization Meetings: 1 to 4 times per year    Marital Status: Married  Catering manager Violence: Not At Risk (11/01/2023)   Humiliation, Afraid, Rape, and Kick questionnaire    Fear of Current or Ex-Partner: No    Emotionally Abused: No    Physically Abused: No    Sexually Abused: No    Physical Exam      Future Appointments  Date Time Provider Department Center  02/23/2024  7:00 AM [facility] HVT DEVICE REMOTES [facility]  02/24/2024  9:00 AM Slusher, Santana LABOR, RN [facility] None  03/09/2024  3:20 PM Arfeen, Leni DASEN, MD [facility] None  03/13/2024  7:10 AM [facility] HVT DEVICE REMOTES [facility]  04/19/2024  1:15 PM Gaynel Delon CROME, DPM [facility]  04/26/2024  8:50 AM Tobie, Tonita K, DO [facility] None  06/12/2024  7:10 AM [facility] HVT DEVICE REMOTES [facility]  09/11/2024  7:10 AM [facility] HVT DEVICE REMOTES [facility]  12/11/2024  7:10 AM [facility] HVT DEVICE REMOTES [facility]  03/12/2025  7:10 AM [facility] HVT DEVICE REMOTES [facility]

## 2024-02-16 ENCOUNTER — Other Ambulatory Visit (HOSPITAL_COMMUNITY): Payer: Self-pay

## 2024-02-16 NOTE — Progress Notes (Signed)
 Paramedicine Encounter    Patient ID: Rhonda [name], female    DOB: 1950/06/11, 73 y.o.   MRN: [id number]    VISIT SUMMARY- Arrived for home visit with [place] to place 3rd Furoscix . Today her weight is 203lbs- yesterday she was 204lbs. Starting weight 206lbs. She reports she has peed a little more than normal last night. She denied shortness of breath. Lungs clear. No edema in lower legs noted. We reviewed HF education. I placed third furoscix  on at 0930. It will be removed at 1430 by her husband. I will plan to follow up in the home tomorrow.   BP 116/64   Pulse 87   Resp 18   Wt 204 lb (92.5 kg)   BMI 37.31 kg/m  Weight yesterday-- 204lbs       ACTION: Home visit completed     Patient Care Team: Chrystal Lamarr GORMAN, MD as PCP - General (Family Medicine) Waddell Danelle ORN, MD as PCP - Electrophysiology (Cardiology) Cathern Andriette DEL, LCSW as Social Worker (Licensed Clinical Social Worker) Tobie, Tonita POUR, DO as Consulting Physician (Neurology) Jacques Moats, Paramedic as Paramedic Chrystal, Lamarr GORMAN, MD as Attending Physician (Family Medicine) Bensimhon, Toribio SAUNDERS, MD as Consulting Physician (Cardiology) Curry Leni DASEN, MD as Consulting Physician (Psychiatry) Tobie Franky SQUIBB, DPM as Consulting Physician (Podiatry) Aniceto Daphne CROME, NP as Nurse Practitioner (Cardiology) Gaynel Delon CROME, DPM as Consulting Physician (Podiatry) Slusher, Santana LABOR, RN as Registered Nurse  Patient Active Problem List   Diagnosis Date Noted   Mild dementia 04/20/2023   Diabetic neuropathy    Elevated troponin 12/02/2021   Acute left ankle pain 10/11/2021   Hyponatremia 10/08/2021   Decreased estrogen level 08/01/2021   Hyperparathyroidism 08/01/2021   Spondylolisthesis 08/01/2021   Varicose veins of bilateral lower extremities with other complications 08/01/2021   Lumbago without sciatica 04/30/2021   Abnormal gait 06/11/2020   Allergic rhinitis 06/11/2020   Asthma 06/11/2020    Benign intracranial hypertension 06/11/2020   Body mass index (BMI) 45.0-49.9, adult 06/11/2020   Bowel incontinence 06/11/2020   Cholelithiasis 06/11/2020   Chronic sinusitis 06/11/2020   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   Daytime somnolence 06/11/2020   Edema 06/11/2020   Family history of malignant neoplasm of gastrointestinal tract 06/11/2020   Gout 06/11/2020   Insomnia 06/11/2020   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   Lumbar spondylosis with myelopathy 06/11/2020   Macrocytosis 06/11/2020   History of colonic polyps 06/11/2020   Repeated falls 06/11/2020   Irritable bowel syndrome 01/04/2020   Spinal stenosis of lumbar region 01/03/2020   Bilateral leg weakness 08/01/2019   Type II diabetes mellitus 06/15/2019   Hypokalemia 11/08/2018   CKD (chronic kidney disease) stage 3, GFR 30-59 ml/min 11/08/2018   Orthostatic hypotension 07/28/2017   On home oxygen  therapy    Migraines    Left bundle branch block    Hypothyroidism    Hypertension    Hyperlipidemia    Heart murmur    GERD (gastroesophageal reflux disease)    Exertional shortness of breath    Major depressive disorder    Back pain    Arthritis    Generalized anxiety disorder    Anemia    Chronic respiratory failure 09/14/2013   Biventricular ICD (implantable cardioverter-defibrillator) in place 08/04/2013   Chronic systolic heart failure 10/27/2012   Endometrial polyp 01/20/2012   Malignant tumor of breast 03/26/2011   Vitamin D  deficiency 03/26/2011    Current Outpatient Medications:    acetaminophen  (TYLENOL ) 500 MG tablet, Take 500 mg  by mouth every 6 (six) hours as needed., Disp: , Rfl:    acetaZOLAMIDE  (DIAMOX ) 125 MG tablet, Take 1 tablet (125 mg total) by mouth daily., Disp: 90 tablet, Rfl: 3   albuterol  (VENTOLIN  HFA) 108 (90 Base) MCG/ACT inhaler, INHALE TWO PUFFS BY MOUTH EVERY 4 HOURS AS NEEDED, Disp: , Rfl:    allopurinol  (ZYLOPRIM ) 100 MG tablet, TAKE 1/2 TABLET (50 MG TOTAL) BY MOUTH DAILY., Disp: 45 tablet,  Rfl: 1   azelastine  (ASTELIN ) 0.1 % nasal spray, Place 2 sprays into both nostrils 2 (two) times daily. Use in each nostril as directed, Disp: 30 mL, Rfl: 12   buPROPion  (WELLBUTRIN  XL) 150 MG 24 hr tablet, Take 1 tablet (150 mg total) by mouth daily., Disp: 30 tablet, Rfl: 5   carvedilol  (COREG ) 3.125 MG tablet, TAKE 1 TABLET (3.125 MG) BY MOUTH TWICE DAILY WITH MEALS, Disp: 180 tablet, Rfl: 3   citalopram  (CELEXA ) 20 MG tablet, Take 1 tablet (20 mg total) by mouth daily., Disp: 30 tablet, Rfl: 5   clonazePAM  (KLONOPIN ) 0.5 MG tablet, Take 1 tablet (0.5 mg total) by mouth at bedtime., Disp: 30 tablet, Rfl: 5   cyanocobalamin  (VITAMIN B12) 1000 MCG tablet, Take 1,000 mcg by mouth daily., Disp: , Rfl:    desloratadine  (CLARINEX ) 5 MG tablet, TAKE 1 TABLET (5 MG TOTAL) BY MOUTH DAILY FOR ALLERGIES, Disp: 90 tablet, Rfl: 3   dicyclomine  (BENTYL ) 20 MG tablet, Take 20 mg by mouth 3 (three) times daily before meals., Disp: , Rfl:    famotidine (PEPCID) 20 MG tablet, Take 20 mg by mouth as needed for heartburn or indigestion., Disp: , Rfl:    fluticasone  (FLONASE ) 50 MCG/ACT nasal spray, Place 1 spray into both nostrils daily., Disp: , Rfl:    Furosemide  (FUROSCIX ) 80 MG/10ML CTKT, Inject 80 mg into the skin daily as needed. As directed by the heart failure clinic, Disp: 5 each, Rfl: 1   gabapentin  (NEURONTIN ) 100 MG capsule, TAKE 1 CAPSULE (100 MG TOTAL) BY MOUTH 2 (TWO) TIMES DAILY., Disp: 60 capsule, Rfl: 0   levothyroxine  (SYNTHROID ) 88 MCG tablet, TAKE 1 TABLET(88 MCG) BY MOUTH DAILY BEFORE BREAKFAST, Disp: 90 tablet, Rfl: 3   memantine  (NAMENDA ) 5 MG tablet, Take 5 mg by mouth 2 (two) times daily., Disp: , Rfl:    metolazone  (ZAROXOLYN ) 2.5 MG tablet, TAKE 1 TABLET BY MOUTH ONCE A WEEK, Disp: 4 tablet, Rfl: 1   midodrine  (PROAMATINE ) 5 MG tablet, Take 1 tablet (5 mg total) by mouth 3 (three) times daily., Disp: 200 tablet, Rfl: 3   Multiple Vitamin (MULTIVITAMIN WITH MINERALS) TABS tablet, Take 1  tablet by mouth daily., Disp: , Rfl:    phenol (CHLORASEPTIC) 1.4 % LIQD, Use as directed 1 spray in the mouth or throat as needed for throat irritation / pain., Disp: , Rfl:    potassium chloride  SA (KLOR-CON  M) 20 MEQ tablet, Take 3 tablets (60 mEq total) by mouth 2 (two) times daily., Disp: 572 tablet, Rfl: 3   risperiDONE  (RISPERDAL ) 0.5 MG tablet, Take 1 tablet (0.5 mg total) by mouth 2 (two) times daily at 8 am and 4 pm., Disp: 60 tablet, Rfl: 5   simvastatin  (ZOCOR ) 10 MG tablet, TAKE 1 TABLET EVERY EVENING, Disp: 90 tablet, Rfl: 3   spironolactone  (ALDACTONE ) 25 MG tablet, TAKE 1 TABLET (25 MG TOTAL) BY MOUTH EVERY EVENING., Disp: 90 tablet, Rfl: 3   tirzepatide  (MOUNJARO ) 12.5 MG/0.5ML Pen, Inject 12.5 mg into the skin once a week., Disp:  2 mL, Rfl: 0   torsemide  (DEMADEX ) 100 MG tablet, Take 1 tablet (100 mg total) by mouth 2 (two) times daily., Disp: 180 tablet, Rfl: 3 Allergies  Allergen Reactions   Ceftin Anaphylaxis    Face and throat swell    Cefuroxime Axetil Anaphylaxis    Face and throat swell   Geodon [Ziprasidone Hcl] Hives   Lisinopril  Other (See Comments)    angioedema  lisinopril    Shellfish-Derived Products Other (See Comments)    Other reaction(s): Other  shellfish derived   Cefuroxime     Other reaction(s): anaphylaxis   Sulfacetamide Sodium-Sulfur      Other reaction(s): itch   Allopurinol  Nausea Only and Other (See Comments)    weakness   Ativan  [Lorazepam ] Itching   Diazepam  Other (See Comments)    Patient states that diazepam  doesn't relax, it has the opposite effect.  Valium    Sulfa Antibiotics Itching     Social History   Socioeconomic History   Marital status: Married    Spouse name: [place]   Number of children: 2   Years of education: 18   Highest education level: Master's degree (e.g., MA, MS, MEng, MEd, MSW, MBA)  Occupational History   Occupation: retired  Tobacco Use   Smoking status: Every Day    Current packs/day: 0.00     Average packs/day: 0.5 packs/day for 26.0 years (13.0 ttl pk-yrs)    Types: Cigarettes    Start date: 03/24/1995    Last attempt to quit: 03/23/2021    Years since quitting: 2.9   Smokeless tobacco: Never  Vaping Use   Vaping status: Never Used  Substance and Sexual Activity   Alcohol use: No   Drug use: No   Sexual activity: Yes  Other Topics Concern   Not on file  Social History Narrative   Tobacco Use Cigarettes: Former Smoker, Quit in 2008   No Alcohol   No recreational drug use   Diet: Regular/Low Carb   Exercise: None   Occupation: disabled   Education: Company secretary, masters   Children: 2   Firearms: No   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   Two story home   Lives with spouse [place]      Social Drivers of Health   Financial Resource Strain: Low Risk  (09/17/2023)   Overall Financial Resource Strain (CARDIA)    Difficulty of Paying Living Expenses: Not very hard  Food Insecurity: No Food Insecurity (11/01/2023)   Hunger Vital Sign    Worried About Running Out of Food in the Last Year: Never true    Ran Out of Food in the Last Year: Never true  Transportation Needs: No Transportation Needs (11/01/2023)   PRAPARE - Administrator, Civil Service (Medical): No    Lack of Transportation (Non-Medical): No  Physical Activity: Insufficiently Active (09/17/2023)   Exercise Vital Sign    Days of Exercise per Week: 1 day    Minutes of Exercise per Session: 20 min  Stress: Stress Concern Present (09/17/2023)   [name] of Occupational Health - Occupational Stress Questionnaire    Feeling of Stress : To some extent  Social Connections: Socially Integrated (09/17/2023)   Social Connection and Isolation Panel    Frequency of Communication with Friends and Family: Twice a week    Frequency of Social Gatherings with Friends and Family: Twice a week    Attends Religious Services: 1 to 4 times per year  Active Member of Clubs or  Organizations: Patient unable to answer    Attends Club or Organization Meetings: 1 to 4 times per year    Marital Status: Married  Catering manager Violence: Not At Risk (11/01/2023)   Humiliation, Afraid, Rape, and Kick questionnaire    Fear of Current or Ex-Partner: No    Emotionally Abused: No    Physically Abused: No    Sexually Abused: No    Physical Exam      Future Appointments  Date Time Provider Department Center  02/23/2024  7:00 AM [facility] HVT DEVICE REMOTES [facility]  02/24/2024  9:00 AM Slusher, Santana LABOR, RN [facility] None  03/09/2024  3:20 PM Arfeen, Leni DASEN, MD [facility] None  03/13/2024  7:10 AM [facility] HVT DEVICE REMOTES [facility]  04/19/2024  1:15 PM Gaynel Delon CROME, DPM [facility]  04/26/2024  8:50 AM Tobie, Tonita K, DO [facility] None  06/12/2024  7:10 AM [facility] HVT DEVICE REMOTES [facility]  09/11/2024  7:10 AM [facility] HVT DEVICE REMOTES [facility]  12/11/2024  7:10 AM [facility] HVT DEVICE REMOTES [facility]  03/12/2025  7:10 AM [facility] HVT DEVICE REMOTES [facility]

## 2024-02-17 ENCOUNTER — Telehealth (HOSPITAL_COMMUNITY): Payer: Self-pay

## 2024-02-17 ENCOUNTER — Other Ambulatory Visit (HOSPITAL_COMMUNITY): Payer: Self-pay

## 2024-02-17 DIAGNOSIS — I5022 Chronic systolic (congestive) heart failure: Secondary | ICD-10-CM

## 2024-02-17 NOTE — Telephone Encounter (Signed)
 Tresha completed Furoscix  and will need follow up BMET- please call her husband to schedule.    Also- she completed a remote transmission for review after completing Furoscix .   Thanks!   [name], EMT-Paramedic [phone] 02/17/2024

## 2024-02-17 NOTE — Progress Notes (Signed)
 Paramedicine Encounter    Patient ID: Rhonda [name], female    DOB: Aug 31, 1950, 73 y.o.   MRN: [id number]   Complaints- fatigue    Assessment- CAOX4, warm and dry, ambulatory without shortness of breath, no dizziness, no swelling. Lungs clear.   Compliance with meds- no missed doses   Pill box filled- for one week   Refills needed- simvastatin , clonazepam , gabapentin , memantine , risperidone    Meds changes since last visit- furoscix  for 3 days finished last one yesterday     Social changes- none    VISIT SUMMARY- Arrived for home visit for Rhonda [name] who reports to be feeling fatigued today. No other complaints. Assessment and vitals as noted. BP was soft today.HF education provided- I sent in a transmission to Rhonda [name] with the device clinic. I will follow up for any further recommendations. Home visit complete I will follow up next week. She knows to call and reach out if needed. Needs follow up labs. I will send message.  BP 100/60   Pulse 80   Resp 16   Wt 205 lb (93 kg)   SpO2 96%   BMI 37.49 kg/m  Weight yesterday-- 204lbs  Last visit weight-- 204lbs   Weight before furoscix - 206lbs   Rhonda [name], EMT-Paramedic [phone] 02/17/2024   ACTION: Home visit completed     Patient Care Team: Rhonda Lamarr GORMAN, MD as PCP - General (Family Medicine) Rhonda Danelle ORN, MD as PCP - Electrophysiology (Cardiology) Rhonda Andriette DEL, LCSW as Social Worker (Licensed Clinical Social Worker) Rhonda, Tonita POUR, DO as Consulting Physician (Neurology) Rhonda [name], Paramedic as Paramedic Rhonda, Lamarr GORMAN, MD as Attending Physician (Family Medicine) Bensimhon, Rhonda SAUNDERS, MD as Consulting Physician (Cardiology) Curry, Rhonda DASEN, MD as Consulting Physician (Psychiatry) Rhonda [name], DPM as Consulting Physician (Podiatry) Aniceto Rhonda CROME, NP as Nurse Practitioner (Cardiology) Rhonda [name], DPM as Consulting Physician (Podiatry) Slusher, Rhonda LABOR, RN as Registered  Nurse  Patient Active Problem List   Diagnosis Date Noted   Mild dementia 04/20/2023   Diabetic neuropathy    Elevated troponin 12/02/2021   Acute left ankle pain 10/11/2021   Hyponatremia 10/08/2021   Decreased estrogen level 08/01/2021   Hyperparathyroidism 08/01/2021   Spondylolisthesis 08/01/2021   Varicose veins of bilateral lower extremities with other complications 08/01/2021   Lumbago without sciatica 04/30/2021   Abnormal gait 06/11/2020   Allergic rhinitis 06/11/2020   Asthma 06/11/2020   Benign intracranial hypertension 06/11/2020   Body mass index (BMI) 45.0-49.9, adult 06/11/2020   Bowel incontinence 06/11/2020   Cholelithiasis 06/11/2020   Chronic sinusitis 06/11/2020   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   Daytime somnolence 06/11/2020   Edema 06/11/2020   Family history of malignant neoplasm of gastrointestinal tract 06/11/2020   Gout 06/11/2020   Insomnia 06/11/2020   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   Lumbar spondylosis with myelopathy 06/11/2020   Macrocytosis 06/11/2020   History of colonic polyps 06/11/2020   Repeated falls 06/11/2020   Irritable bowel syndrome 01/04/2020   Spinal stenosis of lumbar region 01/03/2020   Bilateral leg weakness 08/01/2019   Type II diabetes mellitus 06/15/2019   Hypokalemia 11/08/2018   CKD (chronic kidney disease) stage 3, GFR 30-59 ml/min 11/08/2018   Orthostatic hypotension 07/28/2017   On home oxygen  therapy    Migraines    Left bundle branch block    Hypothyroidism    Hypertension    Hyperlipidemia    Heart murmur    GERD (gastroesophageal reflux disease)    Exertional shortness of breath  Major depressive disorder    Back pain    Arthritis    Generalized anxiety disorder    Anemia    Chronic respiratory failure 09/14/2013   Biventricular ICD (implantable cardioverter-defibrillator) in place 08/04/2013   Chronic systolic heart failure 10/27/2012   Endometrial polyp 01/20/2012   Malignant tumor of breast 03/26/2011    Vitamin D  deficiency 03/26/2011    Current Outpatient Medications:    acetaminophen  (TYLENOL ) 500 MG tablet, Take 500 mg by mouth every 6 (six) hours as needed., Disp: , Rfl:    acetaZOLAMIDE  (DIAMOX ) 125 MG tablet, Take 1 tablet (125 mg total) by mouth daily., Disp: 90 tablet, Rfl: 3   albuterol  (VENTOLIN  HFA) 108 (90 Base) MCG/ACT inhaler, INHALE TWO PUFFS BY MOUTH EVERY 4 HOURS AS NEEDED, Disp: , Rfl:    allopurinol  (ZYLOPRIM ) 100 MG tablet, TAKE 1/2 TABLET (50 MG TOTAL) BY MOUTH DAILY., Disp: 45 tablet, Rfl: 1   azelastine  (ASTELIN ) 0.1 % nasal spray, Place 2 sprays into both nostrils 2 (two) times daily. Use in each nostril as directed, Disp: 30 mL, Rfl: 12   buPROPion  (WELLBUTRIN  XL) 150 MG 24 hr tablet, Take 1 tablet (150 mg total) by mouth daily., Disp: 30 tablet, Rfl: 5   carvedilol  (COREG ) 3.125 MG tablet, TAKE 1 TABLET (3.125 MG) BY MOUTH TWICE DAILY WITH MEALS, Disp: 180 tablet, Rfl: 3   citalopram  (CELEXA ) 20 MG tablet, Take 1 tablet (20 mg total) by mouth daily., Disp: 30 tablet, Rfl: 5   clonazePAM  (KLONOPIN ) 0.5 MG tablet, Take 1 tablet (0.5 mg total) by mouth at bedtime., Disp: 30 tablet, Rfl: 5   cyanocobalamin  (VITAMIN B12) 1000 MCG tablet, Take 1,000 mcg by mouth daily., Disp: , Rfl:    desloratadine  (CLARINEX ) 5 MG tablet, TAKE 1 TABLET (5 MG TOTAL) BY MOUTH DAILY FOR ALLERGIES, Disp: 90 tablet, Rfl: 3   dicyclomine  (BENTYL ) 20 MG tablet, Take 20 mg by mouth 3 (three) times daily before meals., Disp: , Rfl:    famotidine (PEPCID) 20 MG tablet, Take 20 mg by mouth as needed for heartburn or indigestion., Disp: , Rfl:    fluticasone  (FLONASE ) 50 MCG/ACT nasal spray, Place 1 spray into both nostrils daily., Disp: , Rfl:    Furosemide  (FUROSCIX ) 80 MG/10ML CTKT, Inject 80 mg into the skin daily as needed. As directed by the heart failure clinic, Disp: 5 each, Rfl: 1   gabapentin  (NEURONTIN ) 100 MG capsule, TAKE 1 CAPSULE (100 MG TOTAL) BY MOUTH 2 (TWO) TIMES DAILY., Disp: 60  capsule, Rfl: 0   levothyroxine  (SYNTHROID ) 88 MCG tablet, TAKE 1 TABLET(88 MCG) BY MOUTH DAILY BEFORE BREAKFAST, Disp: 90 tablet, Rfl: 3   memantine  (NAMENDA ) 5 MG tablet, Take 5 mg by mouth 2 (two) times daily., Disp: , Rfl:    metolazone  (ZAROXOLYN ) 2.5 MG tablet, TAKE 1 TABLET BY MOUTH ONCE A WEEK, Disp: 4 tablet, Rfl: 1   midodrine  (PROAMATINE ) 5 MG tablet, Take 1 tablet (5 mg total) by mouth 3 (three) times daily., Disp: 200 tablet, Rfl: 3   Multiple Vitamin (MULTIVITAMIN WITH MINERALS) TABS tablet, Take 1 tablet by mouth daily., Disp: , Rfl:    phenol (CHLORASEPTIC) 1.4 % LIQD, Use as directed 1 spray in the mouth or throat as needed for throat irritation / pain., Disp: , Rfl:    potassium chloride  SA (KLOR-CON  M) 20 MEQ tablet, Take 3 tablets (60 mEq total) by mouth 2 (two) times daily., Disp: 572 tablet, Rfl: 3   risperiDONE  (RISPERDAL ) 0.5 MG  tablet, Take 1 tablet (0.5 mg total) by mouth 2 (two) times daily at 8 am and 4 pm., Disp: 60 tablet, Rfl: 5   simvastatin  (ZOCOR ) 10 MG tablet, TAKE 1 TABLET EVERY EVENING, Disp: 90 tablet, Rfl: 3   spironolactone  (ALDACTONE ) 25 MG tablet, TAKE 1 TABLET (25 MG TOTAL) BY MOUTH EVERY EVENING., Disp: 90 tablet, Rfl: 3   tirzepatide  (MOUNJARO ) 12.5 MG/0.5ML Pen, Inject 12.5 mg into the skin once a week., Disp: 2 mL, Rfl: 0   torsemide  (DEMADEX ) 100 MG tablet, Take 1 tablet (100 mg total) by mouth 2 (two) times daily., Disp: 180 tablet, Rfl: 3 Allergies  Allergen Reactions   Ceftin Anaphylaxis    Face and throat swell    Cefuroxime Axetil Anaphylaxis    Face and throat swell   Geodon [Ziprasidone Hcl] Hives   Lisinopril  Other (See Comments)    angioedema  lisinopril    Shellfish-Derived Products Other (See Comments)    Other reaction(s): Other  shellfish derived   Cefuroxime     Other reaction(s): anaphylaxis   Sulfacetamide Sodium-Sulfur      Other reaction(s): itch   Allopurinol  Nausea Only and Other (See Comments)    weakness   Ativan   [Lorazepam ] Itching   Diazepam  Other (See Comments)    Patient states that diazepam  doesn't relax, it has the opposite effect.  Valium    Sulfa Antibiotics Itching     Social History   Socioeconomic History   Marital status: Married    Spouse name: Dallas   Number of children: 2   Years of education: 18   Highest education level: Master's degree (e.g., MA, MS, MEng, MEd, MSW, MBA)  Occupational History   Occupation: retired  Tobacco Use   Smoking status: Every Day    Current packs/day: 0.00    Average packs/day: 0.5 packs/day for 26.0 years (13.0 ttl pk-yrs)    Types: Cigarettes    Start date: 03/24/1995    Last attempt to quit: 03/23/2021    Years since quitting: 2.9   Smokeless tobacco: Never  Vaping Use   Vaping status: Never Used  Substance and Sexual Activity   Alcohol use: No   Drug use: No   Sexual activity: Yes  Other Topics Concern   Not on file  Social History Narrative   Tobacco Use Cigarettes: Former Smoker, Quit in 2008   No Alcohol   No recreational drug use   Diet: Regular/Low Carb   Exercise: None   Occupation: disabled   Education: Company secretary, masters   Children: 2   Firearms: No   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   Two story home   Lives with spouse [place]      Social Drivers of Health   Financial Resource Strain: Low Risk  (09/17/2023)   Overall Financial Resource Strain (CARDIA)    Difficulty of Paying Living Expenses: Not very hard  Food Insecurity: No Food Insecurity (11/01/2023)   Hunger Vital Sign    Worried About Running Out of Food in the Last Year: Never true    Ran Out of Food in the Last Year: Never true  Transportation Needs: No Transportation Needs (11/01/2023)   PRAPARE - Administrator, Civil Service (Medical): No    Lack of Transportation (Non-Medical): No  Physical Activity: Insufficiently Active (09/17/2023)   Exercise Vital Sign    Days of Exercise per Week: 1 day    Minutes of  Exercise per Session:  20 min  Stress: Stress Concern Present (09/17/2023)   [name] of Occupational Health - Occupational Stress Questionnaire    Feeling of Stress : To some extent  Social Connections: Socially Integrated (09/17/2023)   Social Connection and Isolation Panel    Frequency of Communication with Friends and Family: Twice a week    Frequency of Social Gatherings with Friends and Family: Twice a week    Attends Religious Services: 1 to 4 times per year    Active Member of Clubs or Organizations: Patient unable to answer    Attends Banker Meetings: 1 to 4 times per year    Marital Status: Married  Catering manager Violence: Not At Risk (11/01/2023)   Humiliation, Afraid, Rape, and Kick questionnaire    Fear of Current or Ex-Partner: No    Emotionally Abused: No    Physically Abused: No    Sexually Abused: No    Physical Exam      Future Appointments  Date Time Provider Department Center  02/23/2024  7:00 AM [facility] H[facility] DEVICE REMOTES [facility]  02/24/2024  9:00 AM Slusher, Rhonda LABOR, RN [facility] None  03/09/2024  3:20 PM Arfeen, Rhonda DASEN, MD [facility] None  03/13/2024  7:10 AM [facility] DEVICE REMOTES [facility]  04/19/2024  1:15 PM Rhonda [name], DPM [facility]  04/26/2024  8:50 AM Rhonda, Tonita K, DO [facility] None  06/12/2024  7:10 AM [facility] H[facility] DEVICE REMOTES [facility]  09/11/2024  7:10 AM [facility] DEVICE REMOTES [facility]  12/11/2024  7:10 AM [facility] HVT DEVICE REMOTES [facility]  03/12/2025  7:10 AM [facility] H[facility] DEVICE REMOTES [facility]

## 2024-02-18 NOTE — Telephone Encounter (Signed)
 Received patient's device transmission:  Normal device function 0 episodes HF diagnostics remain abnormal.  Forwarding to HF team for awareness.  Effective BVP 98.5%

## 2024-02-18 NOTE — Telephone Encounter (Addendum)
 Spoke w/Mr Leigh, labs sch for 10/1 and f/u appt sch for Wed 10/8 at 3 pm, per previous orders/message from Harlene Gainer, NP

## 2024-02-18 NOTE — Addendum Note (Signed)
 Addended by: BUELL POWELL HERO on: 02/18/2024 10:20 AM   Modules accepted: Orders

## 2024-02-21 ENCOUNTER — Encounter

## 2024-02-22 DIAGNOSIS — I130 Hypertensive heart and chronic kidney disease with heart failure and stage 1 through stage 4 chronic kidney disease, or unspecified chronic kidney disease: Secondary | ICD-10-CM | POA: Diagnosis not present

## 2024-02-22 DIAGNOSIS — I5032 Chronic diastolic (congestive) heart failure: Secondary | ICD-10-CM | POA: Diagnosis not present

## 2024-02-22 DIAGNOSIS — I5041 Acute combined systolic (congestive) and diastolic (congestive) heart failure: Secondary | ICD-10-CM | POA: Diagnosis not present

## 2024-02-22 DIAGNOSIS — E1140 Type 2 diabetes mellitus with diabetic neuropathy, unspecified: Secondary | ICD-10-CM | POA: Diagnosis not present

## 2024-02-22 DIAGNOSIS — N1832 Chronic kidney disease, stage 3b: Secondary | ICD-10-CM | POA: Diagnosis not present

## 2024-02-22 DIAGNOSIS — E039 Hypothyroidism, unspecified: Secondary | ICD-10-CM | POA: Diagnosis not present

## 2024-02-23 ENCOUNTER — Ambulatory Visit
Admission: RE | Admit: 2024-02-23 | Discharge: 2024-02-23 | Disposition: A | Discharge status: Home / Self Care | Source: Ambulatory Visit | Attending: Internal Medicine | Admitting: Internal Medicine

## 2024-02-23 ENCOUNTER — Ambulatory Visit

## 2024-02-23 ENCOUNTER — Other Ambulatory Visit (HOSPITAL_COMMUNITY): Payer: Self-pay | Admitting: Psychiatry

## 2024-02-23 ENCOUNTER — Ambulatory Visit (HOSPITAL_COMMUNITY): Payer: Self-pay | Admitting: Family Medicine

## 2024-02-23 DIAGNOSIS — I5022 Chronic systolic (congestive) heart failure: Secondary | ICD-10-CM | POA: Diagnosis not present

## 2024-02-23 DIAGNOSIS — F411 Generalized anxiety disorder: Secondary | ICD-10-CM

## 2024-02-23 DIAGNOSIS — F330 Major depressive disorder, recurrent, mild: Secondary | ICD-10-CM

## 2024-02-23 DIAGNOSIS — R4189 Other symptoms and signs involving cognitive functions and awareness: Secondary | ICD-10-CM

## 2024-02-23 DIAGNOSIS — Z95810 Presence of automatic (implantable) cardiac defibrillator: Secondary | ICD-10-CM

## 2024-02-23 DIAGNOSIS — Z23 Encounter for immunization: Secondary | ICD-10-CM | POA: Diagnosis not present

## 2024-02-23 LAB — BASIC METABOLIC PANEL WITH GFR
Anion gap: 16 — ABNORMAL HIGH (ref 5–15)
BUN: 46 mg/dL — ABNORMAL HIGH (ref 8–23)
CO2: 26 mmol/L (ref 22–32)
Calcium: 9.5 mg/dL (ref 8.9–10.3)
Chloride: 92 mmol/L — ABNORMAL LOW (ref 98–111)
Creatinine, Ser: 2.29 mg/dL — ABNORMAL HIGH (ref 0.44–1.00)
GFR, Estimated: 22 mL/min — ABNORMAL LOW (ref >60)
Glucose, Bld: 122 mg/dL — ABNORMAL HIGH (ref 70–99)
Potassium: 3.5 mmol/L (ref 3.5–5.1)
Sodium: 134 mmol/L — ABNORMAL LOW (ref 135–145)

## 2024-02-23 NOTE — Progress Notes (Signed)
 EPIC Encounter for ICM Monitoring  Patient Name: Rhonda [name] is a 73 y.o. female Date: 02/23/2024 Primary Care Physican: Rhonda Lamarr GORMAN, MD Primary Cardiologist: Rhonda [name] Electrophysiologist: Rhonda [name]: 99.3%   06/01/2022 Weight: 193 lbs  04/26/2023 Weight: 214 lbs Per EMT note 07/14/2023 Weight: 226 lbs 10/13/2023 Office Weight: 210 lbs 12/15/2023 Weight: 206.3 lbs per Rhonda [name], paramedic visit 01/12/2024 Weight: 205.8 lbs per Rhonda [name], paramedic visit  01/19/2024 Weight: 203 lbs 02/10/2024 Weight: 202 lbs per Rhonda [name] paramedicine visit   Clinical Status (21-Feb-2024 to 23-Feb-2024) Time in AT/AF  <0.1 hr/day (<0.1%)          Transmission results reviewed.                 Since 02/09/2024 ICM Remote Transmission: Optivol thoracic impedance improving and returned close to baseline 02/23/2024 (Furoscix  dosage x 3 days with last dosage on 02/16/2024).   Fluid index continues to be > normal threshold starting 12/27/2023.   Prescribed:  Rhonda [name] with paramedicine program assists with meds.   Torsemide  100 mg 1 tablet(s) (100 mg total) by mouth twice a day  Potassium 20 meq 3 tablets (60 mEq total) by mouth 2 (twice) a day.   Metolazone  2.5 mg 1 tablet by mouth once a week (every Tuesday). Spironolactone  25 mg take 1 tablet by mouth every evening.   Labs: 02/23/2024 BMET Scheduled at HF Clinic 01/26/2024 Creatinine 1.99, BUN 38, Potassium 3.7, Sodium 135, GFR 26, BNP 29.3 10/25/2023 Creatinine 1.78, BUN 46, Potassium 3.3, Sodium 136, GFR 30 10/13/2023 Creatinine 1.91, BUN 41, Potassium 3.5, Sodium 135, GFR 28, BNP 37.5 07/28/2023 Creatinine 2.06, BUN 52, Potassium 3.5, Sodium 135, eGFR 25 07/14/2023 Creatinine 1.91, BUN 47, Potassium 3.3, Sodium 134, eGFR 28, BNP 71.5 A complete set of results can be found in Results Review.   Recommendations:  No changes.     Follow-up plan: ICM clinic phone appointment on 03/08/2024 to recheck fluid levels.   91  day device clinic remote transmission 03/13/2024.     EP/Cardiology Office Visits:   03/01/2024 with HF Clinic.   Recall 05/11/2024 with Dr Cherrie.  Recall 03/21/2024 with Dr Rhonda.     Copy of ICM check sent to Dr. Waddell.   Remote monitoring is medically necessary for Heart Failure Management.    90 day Daily Thoracic Impedance ICM trend: 11/24/2023 through 02/23/2024.    12-14 Month Thoracic Impedance ICM trend:     Rhonda [name] Garner, RN 02/23/2024 7:18 AM

## 2024-02-24 ENCOUNTER — Other Ambulatory Visit: Payer: Self-pay

## 2024-02-24 ENCOUNTER — Other Ambulatory Visit (HOSPITAL_COMMUNITY): Payer: Self-pay

## 2024-02-24 ENCOUNTER — Telehealth (HOSPITAL_COMMUNITY): Payer: Self-pay

## 2024-02-24 ENCOUNTER — Other Ambulatory Visit: Payer: Self-pay | Admitting: Neurology

## 2024-02-24 DIAGNOSIS — F01A0 Vascular dementia, mild, without behavioral disturbance, psychotic disturbance, mood disturbance, and anxiety: Secondary | ICD-10-CM

## 2024-02-24 DIAGNOSIS — R400 Somnolence: Secondary | ICD-10-CM

## 2024-02-24 NOTE — Progress Notes (Signed)
 Paramedicine Encounter    Patient ID: Rhonda [name], female    DOB: [birth year], 73 y.o.   MRN: [id number]   Complaints- headache   Assessment- CAOX4, warm and dry ambulatory with complaints of headache, lungs clear, vitals within normal, legs not swollen more than baseline, weight down 9lbs   Compliance with meds- missed one noon dose of midodrine    Pill box filled- for one week   Refills needed- bupropion , midodrine   Meds changes since last visit- none     Social changes- none    VISIT SUMMARY- Arrived for home visit for Rhonda [name] who reports to be feeling well aside from a headache. She denied any chest pain, dizziness, shortness of breath or swelling more than her baseline. Vitals obtained and within normal for her. BP slightly soft for her but she had not yet taken her midodrine  for the lunch dose.. I reviewed meds and confirmed same. Filled pill box for one week. Refills as noted. I reviewed HF education and appointments reviewed. Home visit complete I will see her in one week.   BP (!) 96/54   Pulse 86   Resp 16   Wt 196 lb (88.9 kg)   SpO2 94%   BMI 35.85 kg/m  Weight yesterday-- DIDN'T WEIGH  Last visit weight-- 205LBS     ACTION: Home visit completed     Patient Care Team: Chrystal Lamarr GORMAN, MD as PCP - General (Family Medicine) Waddell Danelle ORN, MD as PCP - Electrophysiology (Cardiology) Cathern Andriette DEL, LCSW as Social Worker (Licensed Clinical Social Worker) Tobie, Tonita POUR, DO as Consulting Physician (Neurology) Jacques Moats, Paramedic as Paramedic Chrystal, Lamarr GORMAN, MD as Attending Physician (Family Medicine) Bensimhon, Toribio SAUNDERS, MD as Consulting Physician (Cardiology) Curry, Leni DASEN, MD as Consulting Physician (Psychiatry) Tobie Franky SQUIBB, DPM as Consulting Physician (Podiatry) Aniceto Daphne CROME, NP as Nurse Practitioner (Cardiology) Gaynel Delon CROME, DPM as Consulting Physician (Podiatry) Slusher, Santana LABOR, RN as Registered Nurse  Patient  Active Problem List   Diagnosis Date Noted   Mild dementia 04/20/2023   Diabetic neuropathy    Elevated troponin 12/02/2021   Acute left ankle pain 10/11/2021   Hyponatremia 10/08/2021   Decreased estrogen level 08/01/2021   Hyperparathyroidism 08/01/2021   Spondylolisthesis 08/01/2021   Varicose veins of bilateral lower extremities with other complications 08/01/2021   Lumbago without sciatica 04/30/2021   Abnormal gait 06/11/2020   Allergic rhinitis 06/11/2020   Asthma 06/11/2020   Benign intracranial hypertension 06/11/2020   Body mass index (BMI) 45.0-49.9, adult 06/11/2020   Bowel incontinence 06/11/2020   Cholelithiasis 06/11/2020   Chronic sinusitis 06/11/2020   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   Daytime somnolence 06/11/2020   Edema 06/11/2020   Family history of malignant neoplasm of gastrointestinal tract 06/11/2020   Gout 06/11/2020   Insomnia 06/11/2020   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   Lumbar spondylosis with myelopathy 06/11/2020   Macrocytosis 06/11/2020   History of colonic polyps 06/11/2020   Repeated falls 06/11/2020   Irritable bowel syndrome 01/04/2020   Spinal stenosis of lumbar region 01/03/2020   Bilateral leg weakness 08/01/2019   Type II diabetes mellitus 06/15/2019   Hypokalemia 11/08/2018   CKD (chronic kidney disease) stage 3, GFR 30-59 ml/min 11/08/2018   Orthostatic hypotension 07/28/2017   On home oxygen  therapy    Migraines    Left bundle branch block    Hypothyroidism    Hypertension    Hyperlipidemia    Heart murmur    GERD (gastroesophageal reflux disease)  Exertional shortness of breath    Major depressive disorder    Back pain    Arthritis    Generalized anxiety disorder    Anemia    Chronic respiratory failure 09/14/2013   Biventricular ICD (implantable cardioverter-defibrillator) in place 08/04/2013   Chronic systolic heart failure 10/27/2012   Endometrial polyp 01/20/2012   Malignant tumor of breast 03/26/2011   Vitamin D   deficiency 03/26/2011    Current Outpatient Medications:    acetaminophen  (TYLENOL ) 500 MG tablet, Take 500 mg by mouth every 6 (six) hours as needed., Disp: , Rfl:    acetaZOLAMIDE  (DIAMOX ) 125 MG tablet, Take 1 tablet (125 mg total) by mouth daily., Disp: 90 tablet, Rfl: 3   albuterol  (VENTOLIN  HFA) 108 (90 Base) MCG/ACT inhaler, INHALE TWO PUFFS BY MOUTH EVERY 4 HOURS AS NEEDED, Disp: , Rfl:    allopurinol  (ZYLOPRIM ) 100 MG tablet, TAKE 1/2 TABLET (50 MG TOTAL) BY MOUTH DAILY., Disp: 45 tablet, Rfl: 1   azelastine  (ASTELIN ) 0.1 % nasal spray, Place 2 sprays into both nostrils 2 (two) times daily. Use in each nostril as directed, Disp: 30 mL, Rfl: 12   buPROPion  (WELLBUTRIN  XL) 150 MG 24 hr tablet, Take 1 tablet (150 mg total) by mouth daily., Disp: 30 tablet, Rfl: 5   carvedilol  (COREG ) 3.125 MG tablet, TAKE 1 TABLET (3.125 MG) BY MOUTH TWICE DAILY WITH MEALS, Disp: 180 tablet, Rfl: 3   citalopram  (CELEXA ) 20 MG tablet, Take 1 tablet (20 mg total) by mouth daily., Disp: 30 tablet, Rfl: 5   clonazePAM  (KLONOPIN ) 0.5 MG tablet, Take 1 tablet (0.5 mg total) by mouth at bedtime., Disp: 30 tablet, Rfl: 5   cyanocobalamin  (VITAMIN B12) 1000 MCG tablet, Take 1,000 mcg by mouth daily., Disp: , Rfl:    desloratadine  (CLARINEX ) 5 MG tablet, TAKE 1 TABLET (5 MG TOTAL) BY MOUTH DAILY FOR ALLERGIES, Disp: 90 tablet, Rfl: 3   dicyclomine  (BENTYL ) 20 MG tablet, Take 20 mg by mouth 3 (three) times daily before meals., Disp: , Rfl:    famotidine (PEPCID) 20 MG tablet, Take 20 mg by mouth as needed for heartburn or indigestion., Disp: , Rfl:    fluticasone  (FLONASE ) 50 MCG/ACT nasal spray, Place 1 spray into both nostrils daily., Disp: , Rfl:    Furosemide  (FUROSCIX ) 80 MG/10ML CTKT, Inject 80 mg into the skin daily as needed. As directed by the heart failure clinic, Disp: 5 each, Rfl: 1   gabapentin  (NEURONTIN ) 100 MG capsule, TAKE 1 CAPSULE (100 MG TOTAL) BY MOUTH 2 (TWO) TIMES DAILY., Disp: 60 capsule, Rfl:  0   levothyroxine  (SYNTHROID ) 88 MCG tablet, TAKE 1 TABLET(88 MCG) BY MOUTH DAILY BEFORE BREAKFAST, Disp: 90 tablet, Rfl: 3   memantine  (NAMENDA ) 5 MG tablet, Take 5 mg by mouth 2 (two) times daily., Disp: , Rfl:    metolazone  (ZAROXOLYN ) 2.5 MG tablet, TAKE 1 TABLET BY MOUTH ONCE A WEEK, Disp: 4 tablet, Rfl: 1   midodrine  (PROAMATINE ) 5 MG tablet, Take 1 tablet (5 mg total) by mouth 3 (three) times daily., Disp: 200 tablet, Rfl: 3   Multiple Vitamin (MULTIVITAMIN WITH MINERALS) TABS tablet, Take 1 tablet by mouth daily., Disp: , Rfl:    phenol (CHLORASEPTIC) 1.4 % LIQD, Use as directed 1 spray in the mouth or throat as needed for throat irritation / pain., Disp: , Rfl:    potassium chloride  SA (KLOR-CON  M) 20 MEQ tablet, Take 3 tablets (60 mEq total) by mouth 2 (two) times daily., Disp: 572 tablet, Rfl:  3   risperiDONE  (RISPERDAL ) 0.5 MG tablet, Take 1 tablet (0.5 mg total) by mouth 2 (two) times daily at 8 am and 4 pm., Disp: 60 tablet, Rfl: 5   simvastatin  (ZOCOR ) 10 MG tablet, TAKE 1 TABLET EVERY EVENING, Disp: 90 tablet, Rfl: 3   spironolactone  (ALDACTONE ) 25 MG tablet, TAKE 1 TABLET (25 MG TOTAL) BY MOUTH EVERY EVENING., Disp: 90 tablet, Rfl: 3   tirzepatide  (MOUNJARO ) 12.5 MG/0.5ML Pen, Inject 12.5 mg into the skin once a week., Disp: 2 mL, Rfl: 0   torsemide  (DEMADEX ) 100 MG tablet, Take 1 tablet (100 mg total) by mouth 2 (two) times daily., Disp: 180 tablet, Rfl: 3 Allergies  Allergen Reactions   Ceftin Anaphylaxis    Face and throat swell    Cefuroxime Axetil Anaphylaxis    Face and throat swell   Geodon [Ziprasidone Hcl] Hives   Lisinopril  Other (See Comments)    angioedema  lisinopril    Shellfish-Derived Products Other (See Comments)    Other reaction(s): Other  shellfish derived   Cefuroxime     Other reaction(s): anaphylaxis   Sulfacetamide Sodium-Sulfur      Other reaction(s): itch   Allopurinol  Nausea Only and Other (See Comments)    weakness   Ativan  [Lorazepam ]  Itching   Diazepam  Other (See Comments)    Patient states that diazepam  doesn't relax, it has the opposite effect.  Valium    Sulfa Antibiotics Itching     Social History   Socioeconomic History   Marital status: Married    Spouse name: Dallas   Number of children: 2   Years of education: 18   Highest education level: Master's degree (e.g., MA, MS, MEng, MEd, MSW, MBA)  Occupational History   Occupation: retired  Tobacco Use   Smoking status: Every Day    Current packs/day: 0.00    Average packs/day: 0.5 packs/day for 26.0 years (13.0 ttl pk-yrs)    Types: Cigarettes    Start date: 03/24/1995    Last attempt to quit: 03/23/2021    Years since quitting: 2.9   Smokeless tobacco: Never  Vaping Use   Vaping status: Never Used  Substance and Sexual Activity   Alcohol use: No   Drug use: No   Sexual activity: Yes  Other Topics Concern   Not on file  Social History Narrative   Tobacco Use Cigarettes: Former Smoker, Quit in 2008   No Alcohol   No recreational drug use   Diet: Regular/Low Carb   Exercise: None   Occupation: disabled   Education: Company secretary, masters   Children: 2   Firearms: No   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   Two story home   Lives with spouse Dallas      Social Drivers of Health   Financial Resource Strain: Low Risk  (09/17/2023)   Overall Financial Resource Strain (CARDIA)    Difficulty of Paying Living Expenses: Not very hard  Food Insecurity: No Food Insecurity (11/01/2023)   Hunger Vital Sign    Worried About Running Out of Food in the Last Year: Never true    Ran Out of Food in the Last Year: Never true  Transportation Needs: No Transportation Needs (11/01/2023)   PRAPARE - Administrator, Civil Service (Medical): No    Lack of Transportation (Non-Medical): No  Physical Activity: Insufficiently Active (09/17/2023)   Exercise Vital Sign    Days of Exercise per Week: 1 day  Minutes of Exercise per  Session: 20 min  Stress: Stress Concern Present (09/17/2023)   [name] of Occupational Health - Occupational Stress Questionnaire    Feeling of Stress : To some extent  Social Connections: Socially Integrated (09/17/2023)   Social Connection and Isolation Panel    Frequency of Communication with Friends and Family: Twice a week    Frequency of Social Gatherings with Friends and Family: Twice a week    Attends Religious Services: 1 to 4 times per year    Active Member of Clubs or Organizations: Patient unable to answer    Attends Banker Meetings: 1 to 4 times per year    Marital Status: Married  Catering manager Violence: Not At Risk (11/01/2023)   Humiliation, Afraid, Rape, and Kick questionnaire    Fear of Current or Ex-Partner: No    Emotionally Abused: No    Physically Abused: No    Sexually Abused: No    Physical Exam      Future Appointments  Date Time Provider Department Center  03/01/2024  3:00 PM [facility] PA/NP [facility] None  03/08/2024  7:05 AM CVD HVT DEVICE REMOTES [facility]  03/09/2024  3:20 PM Arfeen, Leni DASEN, MD [facility] None  03/13/2024  7:10 AM CVD HVT DEVICE REMOTES [facility] H&V  03/23/2024  9:00 AM Slusher, Santana LABOR, RN [facility] None  04/19/2024  1:15 PM Gaynel Delon CROME, DPM [facility]  04/26/2024  8:50 AM Tobie, Tonita K, DO [facility] None  06/12/2024  7:10 AM CVD HVT DEVICE REMOTES [facility] H&V  09/11/2024  7:10 AM CVD HVT DEVICE REMOTES [facility]&V  12/11/2024  7:10 AM CVD HVT DEVICE REMOTES [facility]&V  03/12/2025  7:10 AM CVD HVT DEVICE REMOTES [facility]&V

## 2024-02-24 NOTE — Patient Instructions (Signed)
 Visit Information  Thank you for taking time to visit with me today. Please don't hesitate to contact me if I can be of assistance to you before our next scheduled appointment.  Your next care management appointment is by telephone on Thursday, October 30th at 9:00am   Please call the care guide team at [phone] if you need to cancel, schedule, or reschedule an appointment.   A reminder to ALL patients/family/friends, please call the [place]  National Suicide Prevention Lifeline: 225 621 4080 or TTY: 938 450 2504 TTY [phone]) to talk to a trained counselor if you are experiencing a Mental Health or Behavioral Health Crisis or need someone to talk to.  Santana Stamp BSN, CCM [place]  [facility] Population Health RN Care Manager Direct Dial: [phone]  Fax: [phone]'

## 2024-02-24 NOTE — Telephone Encounter (Signed)
.  HF Paramedicine Message to [facility]  Pharmacy (if applicable): [facility]    Issue/reason for call: REFILL   Medication refill?  -MIDODRINE   -SIMVASTATIN

## 2024-02-24 NOTE — Patient Outreach (Signed)
 Complex Care Management   Visit Note  02/24/2024  Name:  Rhonda [name] MRN: [id number] DOB: [birth year]  Situation: Referral received for Complex Care Management related to Fall Risk, needs more help at home I obtained verbal consent from Caregiver.  Visit completed with Mr. Gentz/husband and caregiver   on the phone.  Main concern is trying to get more personal care service hours.  He has not been able to located tax document so he can apply for Medicaid which may give him more PCS hours in the home for Rhonda [name]. She is currently receiving in-home aide assist on Tuesday and Friday, two hours a day and continues to receive Paramedicine once weekly.  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/2013   Body mass index (BMI) 45.0-49.9, adult 06/11/2020   Bowel incontinence 06/11/2020   Cardiomyopathy    Cholelithiasis 06/11/2020   Chronic respiratory failure 09/14/2013   Chronic sinusitis 06/11/2020   Chronic systolic heart failure 10/27/2012   a) NICM b) ECHO (03/2013) EF 20-25% c) ECHO (09/2013) EF 45-50%, grade I DD   CKD (chronic kidney disease) stage 3, GFR 30-59 ml/min 11/08/2018   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   Complication of anesthesia    History of low blood pressure after surgery; attributed to lying flat   Daytime somnolence 06/11/2020   Decreased estrogen level 08/01/2021   Diabetic neuropathy    Diarrhea of presumed infectious origin 06/11/2020   Edema 06/11/2020   Elevated troponin 12/02/2021   Endometrial polyp 01/20/2012   Exertional shortness of breath     Generalized anxiety disorder    GERD (gastroesophageal reflux disease)    Gout 06/11/2020   Heart murmur    History of colonic polyps 06/11/2020   History of fall 06/11/2020   Hyperlipidemia    Hyperparathyroidism 08/01/2021   Hypertension    Hypokalemia 11/08/2018   Hyponatremia 10/08/2021   Hypothyroidism    Insomnia 06/11/2020   Intractable nausea and vomiting 10/08/2021   Irritable bowel syndrome 01/04/2020   Left bundle branch block    s/p CRT-D (04/2013)   Lumbago without sciatica 04/30/2021   Lumbar spondylosis with myelopathy 06/11/2020   Macrocytosis 06/11/2020   Major depressive disorder    Malignant tumor of breast 03/26/2011   Left; completed chemotherapy and radiation treatments   Migraines    Mild dementia 04/20/2023   On home oxygen  therapy    2L suppose to be q night (05/03/2013)   Orthostatic hypotension 07/28/2017   Spinal stenosis of lumbar region 01/03/2020   Spondylolisthesis 08/01/2021   SVD (spontaneous vaginal delivery)    x 2   Type II diabetes mellitus 06/15/2019   Varicose veins of bilateral lower extremities with other complications 08/01/2021   Vitamin D  deficiency 03/26/2011    Assessment: Patient Reported Symptoms:  Cognitive Cognitive Status: Requires Assistance Decision Making, Able to follow simple commands, Struggling with memory recall      Neurological Neurological Review of Symptoms: No symptoms reported    HEENT HEENT Symptoms Reported: Not assessed      Cardiovascular Cardiovascular Symptoms  Reported: Other: Other Cardiovascular Symptoms: Reports some swelling in ankles but at baseline. [name] from the Paramedic program checks her legs/feet/ankles each week when she visits and reports back to Cardiology for acute symptoms. Does patient have uncontrolled Hypertension?: No Cardiovascular Management Strategies: Medication therapy  Respiratory Respiratory Symptoms Reported: No symptoms reported    Endocrine Endocrine  Symptoms Reported: Not assessed Is patient diabetic?: Yes    Gastrointestinal Gastrointestinal Symptoms Reported: No symptoms reported      Genitourinary Genitourinary Symptoms Reported: Incontinence Additional Genitourinary Details: Wears adult briefs, husband places triple layer pads on the bed at night to catch urine.    Integumentary Integumentary Symptoms Reported: No symptoms reported Additional Integumentary Details: Husband states patient is able to tell him if she has any skin problems. Skin Self-Management Outcome: 4 (good)  Musculoskeletal Musculoskelatal Symptoms Reviewed: Unsteady gait Additional Musculoskeletal Details: Uses a cane, denies falls        Psychosocial Psychosocial Symptoms Reported: Not assessed          02/24/2024    PHQ2-9 Depression Screening   Little interest or pleasure in doing things    Feeling down, depressed, or hopeless    PHQ-2 - Total Score    Trouble falling or staying asleep, or sleeping too much    Feeling tired or having little energy    Poor appetite or overeating     Feeling bad about yourself - or that you are a failure or have let yourself or your family down    Trouble concentrating on things, such as reading the newspaper or watching television    Moving or speaking so slowly that other people could have noticed.  Or the opposite - being so fidgety or restless that you have been moving around a lot more than usual    Thoughts that you would be better off dead, or hurting yourself in some way    PHQ2-9 Total Score    If you checked off any problems, how difficult have these problems made it for you to do your work, take care of things at home, or get along with other people    Depression Interventions/Treatment      There were no vitals filed for this visit.  Medications Reviewed Today   Medications were not reviewed in this encounter     Recommendation:   Referral to: VBCI LCSW to inquire if they can assist any further  with helping caregiver apply for extra services with DSS. Continue Current Plan of Care Specialty provider appointments: [facility] 03/01/24; Behavioral Health 03/09/24; Nephrology 04/05/24; Neurology 04/26/24  Follow Up Plan:   Telephone follow-up two weeks.  Santana Stamp BSN, CCM [place]  [facility]BCI Population Health RN Care Manager Direct Dial: [phone]  Fax: [phone]

## 2024-02-25 ENCOUNTER — Telehealth

## 2024-02-25 ENCOUNTER — Other Ambulatory Visit (HOSPITAL_COMMUNITY): Payer: Self-pay

## 2024-02-25 MED ORDER — MIDODRINE HCL 5 MG PO TABS: 5.0000 mg | ORAL_TABLET | Freq: Three times a day (TID) | ORAL | 3 refills | Status: AC

## 2024-02-25 MED ORDER — SIMVASTATIN 10 MG PO TABS: 10.0000 mg | ORAL_TABLET | Freq: Every evening | ORAL | 3 refills | Status: AC

## 2024-02-28 ENCOUNTER — Telehealth: Payer: Self-pay | Admitting: *Deleted

## 2024-02-28 NOTE — Progress Notes (Signed)
 Remote ICD Transmission

## 2024-02-28 NOTE — Progress Notes (Signed)
 Complex Care Management Note  Care Guide Note 02/28/2024 Name: VERLENE GLANTZ MRN: [id number] DOB: 08/25/1950  ANASOPHIA PECOR is a 73 y.o. year old female who sees Chrystal, Lamarr GORMAN, MD for primary care. I reached out to Gwen GORMAN [place] by phone today to offer complex care management services.  Ms. Wayment was given information about Complex Care Management services today including:   The Complex Care Management services include support from the care team which includes your Nurse Care Manager, Clinical Social Worker, or Pharmacist.  The Complex Care Management team is here to help remove barriers to the health concerns and goals most important to you. Complex Care Management services are voluntary, and the patient may decline or stop services at any time by request to their care team member.   Complex Care Management Consent Status: Patient agreed to services and verbal consent obtained.   Follow up plan:  Telephone appointment with complex care management team member scheduled for:  03/02/2024  Encounter Outcome:  Patient Scheduled  Thedford Franks, CMA [place]  [facility], [facility] Guide Direct Dial: [phone]  Fax: [phone] Website: [email or web address]

## 2024-02-28 NOTE — Progress Notes (Signed)
 Complex Care Management Note Care Guide Note  02/28/2024 Name: Rhonda [name] MRN: [id number] DOB: 1950-06-30   Complex Care Management Outreach Attempts: An unsuccessful telephone outreach was attempted today to offer the patient information about available complex care management services.  Follow Up Plan:  Additional outreach attempts will be made to offer the patient complex care management information and services.   Encounter Outcome:  No Answer  Thedford Franks, CMA   [facility], [facility] Guide Direct Dial: [phone]  Fax: [phone] Website: [email or web address]

## 2024-02-29 ENCOUNTER — Telehealth: Payer: Self-pay

## 2024-02-29 ENCOUNTER — Encounter (HOSPITAL_COMMUNITY): Payer: Self-pay | Admitting: Physician Assistant

## 2024-02-29 DIAGNOSIS — R413 Other amnesia: Secondary | ICD-10-CM

## 2024-02-29 NOTE — Progress Notes (Signed)
 Called to confirm/remind patient of their appointment at the [facility] on 02/29/2024.   Appointment:   [x] Confirmed  [] Left mess   [] No answer/No voice mail  [] VM Full/unable to leave message  [] Phone not in service  Patient reminded to bring all medications and/or complete list.  Confirmed patient has transportation. Gave directions, instructed to utilize valet parking.   Spoke with Husband [place]

## 2024-03-01 ENCOUNTER — Encounter: Payer: Self-pay | Admitting: Emergency Medicine

## 2024-03-01 ENCOUNTER — Ambulatory Visit (HOSPITAL_COMMUNITY)
Admission: RE | Admit: 2024-03-01 | Discharge: 2024-03-01 | Disposition: A | Discharge status: Home / Self Care | Source: Ambulatory Visit | Attending: Physician Assistant | Admitting: Physician Assistant

## 2024-03-01 ENCOUNTER — Ambulatory Visit: Admission: EM | Admit: 2024-03-01 | Discharge: 2024-03-01 | Disposition: A | Discharge status: Home / Self Care

## 2024-03-01 ENCOUNTER — Other Ambulatory Visit (HOSPITAL_COMMUNITY): Payer: Self-pay

## 2024-03-01 VITALS — BP 118/68 | HR 78 | Wt 204.4 lb

## 2024-03-01 DIAGNOSIS — E1122 Type 2 diabetes mellitus with diabetic chronic kidney disease: Secondary | ICD-10-CM | POA: Diagnosis not present

## 2024-03-01 DIAGNOSIS — I428 Other cardiomyopathies: Secondary | ICD-10-CM

## 2024-03-01 DIAGNOSIS — Z95810 Presence of automatic (implantable) cardiac defibrillator: Secondary | ICD-10-CM | POA: Diagnosis not present

## 2024-03-01 DIAGNOSIS — E1140 Type 2 diabetes mellitus with diabetic neuropathy, unspecified: Secondary | ICD-10-CM

## 2024-03-01 DIAGNOSIS — I878 Other specified disorders of veins: Secondary | ICD-10-CM | POA: Diagnosis not present

## 2024-03-01 DIAGNOSIS — Z87730 Personal history of (corrected) cleft lip and palate: Secondary | ICD-10-CM | POA: Insufficient documentation

## 2024-03-01 DIAGNOSIS — Z853 Personal history of malignant neoplasm of breast: Secondary | ICD-10-CM | POA: Diagnosis not present

## 2024-03-01 DIAGNOSIS — R4189 Other symptoms and signs involving cognitive functions and awareness: Secondary | ICD-10-CM | POA: Insufficient documentation

## 2024-03-01 DIAGNOSIS — Z9221 Personal history of antineoplastic chemotherapy: Secondary | ICD-10-CM | POA: Insufficient documentation

## 2024-03-01 DIAGNOSIS — I130 Hypertensive heart and chronic kidney disease with heart failure and stage 1 through stage 4 chronic kidney disease, or unspecified chronic kidney disease: Secondary | ICD-10-CM | POA: Insufficient documentation

## 2024-03-01 DIAGNOSIS — Z79899 Other long term (current) drug therapy: Secondary | ICD-10-CM | POA: Diagnosis not present

## 2024-03-01 DIAGNOSIS — J9610 Chronic respiratory failure, unspecified whether with hypoxia or hypercapnia: Secondary | ICD-10-CM | POA: Diagnosis not present

## 2024-03-01 DIAGNOSIS — I951 Orthostatic hypotension: Secondary | ICD-10-CM | POA: Diagnosis not present

## 2024-03-01 DIAGNOSIS — I5022 Chronic systolic (congestive) heart failure: Secondary | ICD-10-CM

## 2024-03-01 DIAGNOSIS — Z6837 Body mass index (BMI) 37.0-37.9, adult: Secondary | ICD-10-CM | POA: Insufficient documentation

## 2024-03-01 DIAGNOSIS — N184 Chronic kidney disease, stage 4 (severe): Secondary | ICD-10-CM

## 2024-03-01 DIAGNOSIS — Z923 Personal history of irradiation: Secondary | ICD-10-CM | POA: Insufficient documentation

## 2024-03-01 LAB — BASIC METABOLIC PANEL WITH GFR
Anion gap: 16 — ABNORMAL HIGH (ref 5–15)
BUN: 53 mg/dL — ABNORMAL HIGH (ref 8–23)
CO2: 22 mmol/L (ref 22–32)
Calcium: 9.6 mg/dL (ref 8.9–10.3)
Chloride: 92 mmol/L — ABNORMAL LOW (ref 98–111)
Creatinine, Ser: 2.56 mg/dL — ABNORMAL HIGH (ref 0.44–1.00)
GFR, Estimated: 19 mL/min — ABNORMAL LOW (ref >60)
Glucose, Bld: 119 mg/dL — ABNORMAL HIGH (ref 70–99)
Potassium: 4.5 mmol/L (ref 3.5–5.1)
Sodium: 130 mmol/L — ABNORMAL LOW (ref 135–145)

## 2024-03-01 MED ORDER — GABAPENTIN 400 MG PO CAPS: 400.0000 mg | ORAL_CAPSULE | Freq: Every day | ORAL | 0 refills | Status: DC

## 2024-03-01 MED ORDER — DEXAMETHASONE SODIUM PHOSPHATE 4 MG/ML IJ SOLN
4.0000 mg | Freq: Once | INTRAMUSCULAR | Status: AC
Start: 2024-03-01 — End: 2024-03-01
  Administered 2024-03-01: 4 mg via INTRAMUSCULAR

## 2024-03-01 NOTE — Patient Instructions (Addendum)
 Labs done today. We will contact you only if your labs are abnormal.   No medication changes were made. Please continue all current medications as prescribed.  Your physician recommends that you schedule a follow-up appointment in: 2 months with our NP/PA Clinic here in our office.   If you have any questions or concerns before your next appointment please send us  a message through [place] or call our office at [phone].    TO LEAVE A MESSAGE FOR THE NURSE SELECT OPTION 2, PLEASE LEAVE A MESSAGE INCLUDING: YOUR NAME DATE OF BIRTH CALL BACK NUMBER REASON FOR CALL**this is important as we prioritize the call backs  YOU WILL RECEIVE A CALL BACK THE SAME DAY AS LONG AS YOU CALL BEFORE 4:00 PM   Do the following things EVERYDAY: Weigh yourself in the morning before breakfast. Write it down and keep it in a log. Take your medicines as prescribed Eat low salt foods--Limit salt (sodium) to 2000 mg per day.  Stay as active as you can everyday Limit all fluids for the day to less than 2 liters   At the [facility], you and your health needs are our priority. As part of our continuing mission to provide you with exceptional heart care, we have created designated Provider Care Teams. These Care Teams include your primary Cardiologist (physician) and Advanced Practice Providers (APPs- Physician Assistants and Nurse Practitioners) who all work together to provide you with the care you need, when you need it.   You may see any of the following providers on your designated Care Team at your next follow up: Dr Toribio Fuel Dr Ezra Shuck Dr. Ria Gardenia Greig Lenetta, NP Caffie Shed, [place] [facility] [place], [place] Beckey Coe, NP Tinnie Redman, PharmD   Please be sure to bring in all your medications bottles to every appointment.    Thank you for choosing [facility]

## 2024-03-01 NOTE — Progress Notes (Signed)
 Advanced Heart Failure Clinic Note   Primary Cardiologist: Dr Shlomo  PCP: Chrystal Lamarr RAMAN, MD HF Cardiologist: Dr. Cherrie  HPI: Rhonda [name] is a 73 y.o. 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 She has had sleep study with no evidence of sleep apnea in remote past.  She has a Medtronic CRT-D device. Echo in 7/18 showed recovery of EF to 55-60%. PYP 09/02/17 negative for TTR (Grade 0-1, H/CCL 1.2). SPEP with no M-spike.   Has been followed by Tonita Blanch in Neurology. Had been on Florinef  and midodrine  for orthostatic hypotension (failed mestinon).   Seen by Dr. Marlee and SCr slightly worse, so Florinef  stopped. Echo 1/23 EF 60-65%, RV ok  Admitted 5/23 with AKI. Resuscitated with IVF, Florinef  and midodrine  restarted. SCr stabilized at 1.6-1.8.  Follow up 7/24, volume mildly up and BP elevated. Midodrine  decreased to 5 bid, repeat echo arranged.  Echo 12/30/22 EF 55-60% G1DD RV normal.   OptiVol elevated on remote interrogation 9/17. She was given furoscix  X 3 days.  Here today for CHF follow-up.  Accompanied by Paramedic, [name], and her husband who provide most of the history. Cognitive status and mobility have worsened over the last year. She is sleepy during today's visit, took higher dose of gabapentin  today for her neuropathy after recently running out of the medicine. Home weight down 9 lb last week (196 lb) after recent use of furoscix . Also on GLP1-RA. No weight at home this week, clinic scale tends to be about 10 lb higher than home (204 lb today). No reports of shortness of breath, orthopnea, PND or lower extremity edema. Has  cut back on sodium intake and eats less take out with assistance from her husband.  Cardiac Studies  - Echo (5/14): EF 35% RV ok  - Echo (11/14): EF 20-25%, LV moderately dilated - Echo (5/15): EF 45-50% - Echo (7/18): EF 55-60%, normal RV size and systolic function, PASP 34 mmHg - PYP (4/19): negative TTR - Echo (1/21): EF 60-65% grade II DD. RV ok - Echo (1/23): EF 60-65%, RV ok - Echo (8/24): EF 55-60%, G1DD, RV normal  Review of systems complete and found to be negative unless listed in HPI.   Past Medical History:  Diagnosis Date   Abnormal gait 06/11/2020   Acute left ankle pain 10/11/2021   AKI (acute kidney injury) 10/08/2021   Allergic rhinitis 06/11/2020   Anemia    Arthritis    Right knee   Asthma 06/11/2020   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   Back pain    Disk problem   Benign intracranial hypertension 06/11/2020   Bilateral leg weakness 08/01/2019   Biventricular ICD (implantable cardioverter-defibrillator) in place 08/04/2013   Body mass index (BMI) 45.0-49.9, adult 06/11/2020   Bowel incontinence 06/11/2020   Cardiomyopathy    Cholelithiasis 06/11/2020   Chronic respiratory failure 09/14/2013   Chronic sinusitis 06/11/2020   Chronic systolic heart failure 10/27/2012   a) NICM b) ECHO (03/2013) EF 20-25% c) ECHO (09/2013) EF 45-50%, grade I DD   CKD (chronic kidney disease) stage 3, GFR 30-59 ml/min 11/08/2018   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   Complication of anesthesia    History of  low blood pressure after surgery; attributed to lying flat   Daytime somnolence 06/11/2020   Decreased estrogen level 08/01/2021   Diabetic neuropathy    Diarrhea of presumed infectious origin 06/11/2020   Edema 06/11/2020   Elevated troponin 12/02/2021   Endometrial polyp 01/20/2012   Exertional shortness of breath    Generalized anxiety disorder    GERD (gastroesophageal reflux disease)    Gout 06/11/2020   Heart murmur    History of colonic polyps 06/11/2020   History of  fall 06/11/2020   Hyperlipidemia    Hyperparathyroidism 08/01/2021   Hypertension    Hypokalemia 11/08/2018   Hyponatremia 10/08/2021   Hypothyroidism    Insomnia 06/11/2020   Intractable nausea and vomiting 10/08/2021   Irritable bowel syndrome 01/04/2020   Left bundle branch block    s/p CRT-D (04/2013)   Lumbago without sciatica 04/30/2021   Lumbar spondylosis with myelopathy 06/11/2020   Macrocytosis 06/11/2020   Major depressive disorder    Malignant tumor of breast 03/26/2011   Left; completed chemotherapy and radiation treatments   Migraines    Mild dementia 04/20/2023   On home oxygen  therapy    2L suppose to be q night (05/03/2013)   Orthostatic hypotension 07/28/2017   Spinal stenosis of lumbar region 01/03/2020   Spondylolisthesis 08/01/2021   SVD (spontaneous vaginal delivery)    x 2   Type II diabetes mellitus 06/15/2019   Varicose veins of bilateral lower extremities with other complications 08/01/2021   Vitamin D  deficiency 03/26/2011   Current Outpatient Medications  Medication Sig Dispense Refill   acetaminophen  (TYLENOL ) 500 MG tablet Take 500 mg by mouth every 6 (six) hours as needed.     acetaZOLAMIDE  (DIAMOX ) 125 MG tablet Take 1 tablet (125 mg total) by mouth daily. 90 tablet 3   albuterol  (VENTOLIN  HFA) 108 (90 Base) MCG/ACT inhaler INHALE TWO PUFFS BY MOUTH EVERY 4 HOURS AS NEEDED     allopurinol  (ZYLOPRIM ) 100 MG tablet TAKE 1/2 TABLET (50 MG TOTAL) BY MOUTH DAILY. 45 tablet 1   amoxicillin  (AMOXIL ) 875 MG tablet TAKE 1 TABLET BY MOUTH TWICE A DAY 7 DAYS     azelastine  (ASTELIN ) 0.1 % nasal spray Place 2 sprays into both nostrils 2 (two) times daily. Use in each nostril as directed 30 mL 12   buPROPion  (WELLBUTRIN  XL) 150 MG 24 hr tablet Take 1 tablet (150 mg total) by mouth daily. 30 tablet 5   Capsaicin 0.1 % CREA 1 application as needed Externally Three times a day as needed     carvedilol  (COREG ) 3.125 MG tablet TAKE 1 TABLET (3.125 MG) BY MOUTH  TWICE DAILY WITH MEALS 180 tablet 3   citalopram  (CELEXA ) 20 MG tablet Take 1 tablet (20 mg total) by mouth daily. 30 tablet 5   clonazePAM  (KLONOPIN ) 0.5 MG tablet Take 1 tablet (0.5 mg total) by mouth at bedtime. 30 tablet 5   COMIRNATY syringe      cyanocobalamin  (VITAMIN B12) 1000 MCG tablet Take 1,000 mcg by mouth daily.     desloratadine  (CLARINEX ) 5 MG tablet TAKE 1 TABLET (5 MG TOTAL) BY MOUTH DAILY FOR ALLERGIES 90 tablet 3   dicyclomine  (BENTYL ) 20 MG tablet Take 20 mg by mouth 3 (three) times daily before meals.     doxycycline  (VIBRAMYCIN ) 100 MG capsule Take 100 mg by mouth 2 (two) times daily.     famotidine (PEPCID) 20 MG tablet Take 20 mg by mouth as needed for heartburn or indigestion.  febuxostat  (ULORIC ) 40 MG tablet 1 tablet Orally Once a day     ferrous sulfate  325 (65 FE) MG tablet 1 tablet Orally Once a day     fludrocortisone  (FLORINEF ) 0.1 MG tablet      fluticasone  (FLONASE ) 50 MCG/ACT nasal spray Place 1 spray into both nostrils daily.     Furosemide  (FUROSCIX ) 80 MG/10ML CTKT Inject 80 mg into the skin daily as needed. As directed by the heart failure clinic 5 each 1   gabapentin  (NEURONTIN ) 100 MG capsule TAKE 1 CAPSULE (100 MG TOTAL) BY MOUTH 2 (TWO) TIMES DAILY. 180 capsule 0   gabapentin  (NEURONTIN ) 400 MG capsule Take 1 capsule (400 mg total) by mouth daily in the afternoon for 3 days. Continue the prior prescription of 100 mg twice a day in addition to the 400 mg once daily in the afternoon for a total of 600 mg daily which is the maximum allowed due to chronic kidney condition 3 capsule 0   HYDROcodone -acetaminophen  (NORCO/VICODIN) 5-325 MG tablet Take 1 tablet by mouth every 6 (six) hours as needed for moderate pain (pain score 4-6).     hydrOXYzine  (VISTARIL ) 25 MG capsule      ipratropium (ATROVENT ) 0.03 % nasal spray      levothyroxine  (SYNTHROID ) 88 MCG tablet TAKE 1 TABLET(88 MCG) BY MOUTH DAILY BEFORE BREAKFAST 90 tablet 3   lisinopril  (ZESTRIL ) 2.5 MG  tablet 1 tablet Orally Once a day     memantine  (NAMENDA ) 5 MG tablet Take 5 mg by mouth 2 (two) times daily.     methocarbamol  (ROBAXIN ) 500 MG tablet 1.5 tablets Orally every 4 hrs     metolazone  (ZAROXOLYN ) 2.5 MG tablet TAKE 1 TABLET BY MOUTH ONCE A WEEK 4 tablet 1   midodrine  (PROAMATINE ) 5 MG tablet Take 1 tablet (5 mg total) by mouth 3 (three) times daily. 200 tablet 3   mirtazapine  (REMERON ) 30 MG tablet      montelukast  (SINGULAIR ) 10 MG tablet      Multiple Vitamin (MULTIVITAMIN WITH MINERALS) TABS tablet Take 1 tablet by mouth daily.     nicotine polacrilex (COMMIT) 2 MG lozenge as directed Mouth/Throat every 4 hours     nystatin (MYCOSTATIN) 100000 UNIT/ML suspension 4 mL swish and swallow Mouth/Throat Four times a day; Duration: 14 days     ondansetron  (ZOFRAN ) 4 MG tablet TAKE 1 TABLET (4 MG TOTAL) BY MOUTH EVERY 6 (SIX) HOURS.     pantoprazole  (PROTONIX ) 40 MG tablet TAKE ONE TABLET BY MOUTH ONCE DAILY FOR ACID REFLUX     phenol (CHLORASEPTIC) 1.4 % LIQD Use as directed 1 spray in the mouth or throat as needed for throat irritation / pain.     potassium chloride  SA (KLOR-CON  M) 20 MEQ tablet Take 3 tablets (60 mEq total) by mouth 2 (two) times daily. 572 tablet 3   risperiDONE  (RISPERDAL ) 0.5 MG tablet Take 1 tablet (0.5 mg total) by mouth 2 (two) times daily at 8 am and 4 pm. 60 tablet 5   simvastatin  (ZOCOR ) 10 MG tablet Take 1 tablet (10 mg total) by mouth every evening. 90 tablet 3   spironolactone  (ALDACTONE ) 25 MG tablet TAKE 1 TABLET (25 MG TOTAL) BY MOUTH EVERY EVENING. 90 tablet 3   tirzepatide  (MOUNJARO ) 12.5 MG/0.5ML Pen Inject 12.5 mg into the skin once a week. 2 mL 0   tiZANidine  (ZANAFLEX ) 2 MG tablet 1 tablet at bedtime as needed Oral Once a day; Duration: 30 days  torsemide  (DEMADEX ) 100 MG tablet Take 1 tablet (100 mg total) by mouth 2 (two) times daily. 180 tablet 3   traMADol (ULTRAM) 50 MG tablet      zolpidem  (AMBIEN ) 10 MG tablet 1 tablet at bedtime as  needed Orally Once a day     No current facility-administered medications for this encounter.   BP 118/68   Pulse 78   Wt 92.7 kg (204 lb 6.4 oz)   SpO2 98%   BMI 37.39 kg/m   Wt Readings from Last 3 Encounters:  03/01/24 92.7 kg (204 lb 6.4 oz)  02/24/24 88.9 kg (196 lb)  02/17/24 93 kg (205 lb)     General:  Chronically ill appearing elderly female. No distress. Cor: JVD difficult but does not appear elevated. Regular rate & rhythm. No murmurs. Lungs: clear Abdomen: obese, soft, nontender, nondistended.  Extremities: no edema Neuro: Drowsy but wakes up and answers questions appropriately    Device interrogation (personally reviewed): Unable to interrogate device in clinic today Reviewed interrogation from 10/1: OptiVol trending down and thoracic impedance starting to trend back up  ReDS 26%, low normal  ASSESSMENT & PLAN:  1) Chronic systolic HF with recovered EF/nonischemic cardiomyopathy:  - NICM s/p Medtronic CRT-D, ? Viral myocarditis. Cardiomyopathy dates from 2.  - Echo (5/15): EF 45-50% - Echo (2020): EF up to 55-60%.  - PYP (4/19) negative for TTR (Grade 0-1, H/CCL 1.2). SPEP with no M-spike.  - Echo (1/21): EF 60-65% grade II DD. RV ok.  - Echo (1/23): EF 60-65% RV normal.  - Echo 12/30/22: EF 55-60% G1DD RV normal.  - NYHA III. Mobility has declined along with cognitive status over the last year. Not very functional.  - Volume okay on exam. OptiVol trending down on device check last week. Home weight down significantly. ReDS down to 26% (33% last visit). Continue Torsemide  100 mg BID + once weekly metolazone  (2.5 mg) on Tuesday. Will contact [name], RN to obtain updated remote interrogation - BMET today - Continue carvedilol  3.125 mg bid. - Continue spironolactone  25 mg daily.  - Continue midodrine  5 mg tid. - No SGT2i with chronic yeast infections. - Continue with paramedicine. Appreciate assistance. - Check BMP today  - update echo   2)  Obesity - Body mass index is 37.39 kg/m. - Now on GLP1RA, weight coming down.  3) HTN - BP controlled.  - Has been on midodrine   4) Orthostatic Hypotension.  - Neurology managing.  - She is on diamox  for increased ICP. - Continue midodrine  as above  - No change  5) Chronic Venous stasis - Encouraged her to use compression stockings   - No change  6) Falls  - No recent falls - Follows with Neurology.  - continue midodrine  support   7) CKD IV - Baseline Scr recently 1.8-2 - Follows with Dr. Marlee in Nephrology - not on SGLT2i given h/o frequent yeast infections  - Labs today  8) Cognitive impairment - Undergoing evaluation with Neurology - Concern for Alzheimer's disease   Follow-up: 2 months with APP   Chayah Mckee N, PA-C  4:41 PM

## 2024-03-01 NOTE — Discharge Instructions (Addendum)
 Your wife was seen today for increased pain, numbness, and tingling in her feet related to her diabetic neuropathy. These symptoms worsened after she ran out of her regular gabapentin  medication last week. Because of her kidney function, she should not take more than 600 mg of gabapentin  in a day. To help manage her pain, you were instructed to pick up her gabapentin  prescription from the pharmacy today. She should take gabapentin  100 mg in the morning and 100 mg in the evening as prescribed. In addition, for the next three days only, she should take one extra 400 mg gabapentin  tablet in the afternoon. She also received a steroid injection (Decadron ) in the clinic today to help with pain and inflammation. Please continue giving her medications as prescribed. If her symptoms do not improve, or if they worsen, you should schedule follow-up with her primary care doctor or neurologist. She should go to the emergency department immediately if she develops new confusion, severe weakness, loss of ability to walk, chest pain, trouble breathing, or any other sudden or concerning symptoms.

## 2024-03-01 NOTE — Progress Notes (Signed)
 Paramedicine Encounter  Patient ID: Rhonda [name], female, DOB: 06-28-1950, 74 y.o.,  MRN: [id number]  Met patient in clinic today with Rhonda [name] today, Rhonda [name] complains of foot pain, seen in UC earlier today. Given steroid IM and 400mg  Gabapentin  for noon dose for three days in addition to her 100mg  BID. She took 400mg  at 1512 and began to doze off during visit. She says her foot pain was easing up. Rhonda [name] completed her assessment and meds we're reviewed.    Weight @ clinic- 204lbs  Weight @ home- 196lbs (last week)   B/P- 118/68   P-78   SP02- 98   REDS CLIP- 26   Med changes- none   Social Changes- none   Labs and med review completed today. I filled pill box for one week. I plan to follow up in the home next week pending lab and optivol review. Clinic visit complete.    Rhonda [name], EMT-Paramedic [phone] 03/01/2024

## 2024-03-01 NOTE — ED Triage Notes (Signed)
 Pt reports burning sensation in both feet (worse in R>L) that started yesterday. Pt has T2DM and reports diabetic neuropathy at baseline. Reports this burning sensation is much worse than her normal pins & needles sensation and more constant. Sprayed nervive on her feet and rubbed bengay on them with no relief. No radiation of sensation up her leg just in feet.

## 2024-03-01 NOTE — ED Provider Notes (Signed)
 [facility]    CSN: [id number] Arrival date & time: 03/01/24  0841      History   Chief Complaint Chief Complaint  Patient presents with   Foot Pain    HPI Rhonda [name] is a 73 y.o. female.   Discussed the use of AI scribe software for clinical note transcription with the patient's husband, who gave verbal consent to proceed.   Patient with dementia; history primarily obtained from husband and review of chart.  She has a known history of diabetic neuropathy and presents with worsening neuropathic symptoms, including pins and needles, numbness, and pain in both feet. She is prescribed gabapentin  100 mg twice daily by her neurologist but has not taken it since last week after running out of medication. A refill for 180 capsules was sent to her pharmacy on 02/25/24 but has not yet been picked up. She also has an additional prescription for gabapentin  800 mg PRN for severe pain, which she has not used in some time. Last night, she attempted symptomatic relief with Bengay and Nervive pain relieving spray without improvement.  Her established care team includes Dr. Tobie (neurologist managing gabapentin ) and Dr. Chrystal at [facility] (primary care provider). She has not seen Dr. Tobie since February. A paramedic visits weekly to assist with medication organization.  Past medical history includes diabetes managed with oral medications (no insulin ). Husband reports glucose levels have been stable on prior labs, though she does not monitor blood sugars at home. Most recent A1C is unknown. She received a flu vaccine and had blood work performed on 02/23/24.  The following sections of the patient's history were reviewed and updated as appropriate: allergies, current medications, past family history, past medical history, past social history, past surgical history, and problem list.     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/04/2013   Body mass index (BMI) 45.0-49.9, adult 06/11/2020   Bowel incontinence 06/11/2020   Cardiomyopathy    Cholelithiasis 06/11/2020   Chronic respiratory failure 09/14/2013   Chronic sinusitis 06/11/2020   Chronic systolic heart failure 10/27/2012   a) NICM b) ECHO (03/2013) EF 20-25% c) ECHO (09/2013) EF 45-50%, grade I DD   CKD (chronic kidney disease) stage 3, GFR 30-59 ml/min 11/08/2018   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   Complication of anesthesia    History of low blood pressure after surgery; attributed to lying flat   Daytime somnolence 06/11/2020   Decreased estrogen level 08/01/2021   Diabetic neuropathy    Diarrhea of presumed infectious origin 06/11/2020   Edema 06/11/2020   Elevated troponin 12/02/2021   Endometrial polyp 01/20/2012   Exertional shortness of breath    Generalized anxiety disorder    GERD (gastroesophageal reflux disease)    Gout 06/11/2020   Heart murmur    History of colonic polyps 06/11/2020   History of fall 06/11/2020   Hyperlipidemia    Hyperparathyroidism 08/01/2021   Hypertension    Hypokalemia 11/08/2018   Hyponatremia 10/08/2021   Hypothyroidism    Insomnia 06/11/2020   Intractable nausea and vomiting  10/08/2021   Irritable bowel syndrome 01/04/2020   Left bundle branch block    s/p CRT-D (04/2013)   Lumbago without sciatica 04/30/2021   Lumbar spondylosis with myelopathy 06/11/2020   Macrocytosis 06/11/2020   Major depressive disorder    Malignant tumor of breast 03/26/2011   Left; completed chemotherapy and radiation treatments   Migraines    Mild dementia 04/20/2023    On home oxygen  therapy    2L suppose to be q night (05/03/2013)   Orthostatic hypotension 07/28/2017   Spinal stenosis of lumbar region 01/03/2020   Spondylolisthesis 08/01/2021   SVD (spontaneous vaginal delivery)    x 2   Type II diabetes mellitus 06/15/2019   Varicose veins of bilateral lower extremities with other complications 08/01/2021   Vitamin D  deficiency 03/26/2011    Patient Active Problem List   Diagnosis Date Noted   Mild dementia 04/20/2023   Diabetic neuropathy    Elevated troponin 12/02/2021   Acute left ankle pain 10/11/2021   Hyponatremia 10/08/2021   Decreased estrogen level 08/01/2021   Hyperparathyroidism 08/01/2021   Spondylolisthesis 08/01/2021   Varicose veins of bilateral lower extremities with other complications 08/01/2021   Lumbago without sciatica 04/30/2021   Abnormal gait 06/11/2020   Allergic rhinitis 06/11/2020   Asthma 06/11/2020   Benign intracranial hypertension 06/11/2020   Body mass index (BMI) 45.0-49.9, adult 06/11/2020   Bowel incontinence 06/11/2020   Cholelithiasis 06/11/2020   Chronic sinusitis 06/11/2020   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   Daytime somnolence 06/11/2020   Edema 06/11/2020   Family history of malignant neoplasm of gastrointestinal tract 06/11/2020   Gout 06/11/2020   Insomnia 06/11/2020   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   Lumbar spondylosis with myelopathy 06/11/2020   Macrocytosis 06/11/2020   History of colonic polyps 06/11/2020   Repeated falls 06/11/2020   Irritable bowel syndrome 01/04/2020   Spinal stenosis of lumbar region 01/03/2020   Bilateral leg weakness 08/01/2019   Type II diabetes mellitus 06/15/2019   Hypokalemia 11/08/2018   CKD (chronic kidney disease) stage 3, GFR 30-59 ml/min 11/08/2018   Orthostatic hypotension 07/28/2017   On home oxygen  therapy    Migraines    Left bundle branch block    Hypothyroidism    Hypertension    Hyperlipidemia    Heart murmur    GERD (gastroesophageal reflux  disease)    Exertional shortness of breath    Major depressive disorder    Back pain    Arthritis    Generalized anxiety disorder    Anemia    Chronic respiratory failure 09/14/2013   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   Chronic systolic heart failure 10/27/2012   Endometrial polyp 01/20/2012   Malignant tumor of breast 03/26/2011   Vitamin D  deficiency 03/26/2011    Past Surgical History:  Procedure Laterality Date   BI-VENTRICULAR IMPLANTABLE CARDIOVERTER DEFIBRILLATOR N/A 05/03/2013   Procedure: BI-VENTRICULAR IMPLANTABLE CARDIOVERTER DEFIBRILLATOR  (CRT-D);  Surgeon: Danelle LELON Birmingham, MD;  Location[facility] CATH LAB;  Service: Cardiovascular;  Laterality: N/A;   BI-VENTRICULAR IMPLANTABLE CARDIOVERTER DEFIBRILLATOR  (CRT-D)  05/03/2013   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   BIOPSY  05/31/2018   Procedure: BIOPSY;  Surgeon: Saintclair Jasper, MD;  Location: [facility];  Service: Gastroenterology;;   BIV ICD GENERATOR CHANGEOUT N/A 04/11/2020   Procedure: BIV ICD GENERATOR CHANGEOUT;  Surgeon: Birmingham Danelle LELON, MD;  Location: [facility] INVASIVE CV LAB;  Service: Cardiovascular;  Laterality: N/A;   BREAST LUMPECTOMY Left 07/28/2010  CARDIAC CATHETERIZATION  2010   NORMAL CORONARY ARTERIES   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--11 SURGERIES     PT HAS REMOVABLE SPEECH BULB-TAKES IT OUT BEFORE HER SURGERY   COLONOSCOPY     COLONOSCOPY N/A 05/31/2018   Procedure: COLONOSCOPY;  Surgeon: Saintclair Jasper, MD;  Location: [facility];  Service: Gastroenterology;  Laterality: N/A;   HYSTEROSCOPY WITH D & C  01/20/2012   Procedure: DILATATION AND CURETTAGE /HYSTEROSCOPY;  Surgeon: Charlie CHRISTELLA Croak, MD;  Location: [facility];  Service: Gynecology;  Laterality: N/A;  with trueclear   PORT-A-CATH REMOVAL  09/23/2011   Procedure: REMOVAL PORT-A-CATH;  Surgeon: Donnice Bury, MD;  Location: [facility];  Service: General;  Laterality: N/A;  Port Removal    PORTACATH PLACEMENT  2012   TONSILLECTOMY  1960's   TOTAL KNEE ARTHROPLASTY  05/10/2012   Procedure: TOTAL KNEE ARTHROPLASTY;  Surgeon: Donnice JONETTA Car, MD;  Location: [facility];  Service: Orthopedics;  Laterality: Right;   WISDOM TOOTH EXTRACTION      OB History   No obstetric history on file.      Home Medications    Prior to Admission medications   Medication Sig Start Date End Date Taking? Authorizing Provider  acetaminophen  (TYLENOL ) 500 MG tablet Take 500 mg by mouth every 6 (six) hours as needed.   Yes [provider]  acetaZOLAMIDE  (DIAMOX ) 125 MG tablet Take 1 tablet (125 mg total) by mouth daily. 02/27/21  Yes Patel, Donika K, DO  azelastine  (ASTELIN ) 0.1 % nasal spray Place 2 sprays into both nostrils 2 (two) times daily. Use in each nostril as directed 02/04/23  Yes Soldatova, Liuba, MD  buPROPion  (WELLBUTRIN  XL) 150 MG 24 hr tablet Take 1 tablet (150 mg total) by mouth daily. 09/09/23  Yes Arfeen, Leni DASEN, MD  Capsaicin 0.1 % CREA 1 application as needed Externally Three times a day as needed 03/04/23  Yes [provider]  carvedilol  (COREG ) 3.125 MG tablet TAKE 1 TABLET (3.125 MG) BY MOUTH TWICE DAILY WITH MEALS 06/15/23  Yes Colletta Manuelita Garre, PA-C  citalopram  (CELEXA ) 20 MG tablet Take 1 tablet (20 mg total) by mouth daily. 09/09/23  Yes Arfeen, Leni DASEN, MD  clonazePAM  (KLONOPIN ) 0.5 MG tablet Take 1 tablet (0.5 mg total) by mouth at bedtime. 09/09/23  Yes Arfeen, Leni DASEN, MD  cyanocobalamin  (VITAMIN B12) 1000 MCG tablet Take 1,000 mcg by mouth daily.   Yes [provider]  desloratadine  (CLARINEX ) 5 MG tablet TAKE 1 TABLET (5 MG TOTAL) BY MOUTH DAILY FOR ALLERGIES 11/03/23  Yes Soldatova, Liuba, MD  dicyclomine  (BENTYL ) 20 MG tablet Take 20 mg by mouth 3 (three) times daily before meals.   Yes [provider]  famotidine (PEPCID) 20 MG tablet Take 20 mg by mouth as needed for heartburn or indigestion.   Yes [provider]  gabapentin   (NEURONTIN ) 100 MG capsule TAKE 1 CAPSULE (100 MG TOTAL) BY MOUTH 2 (TWO) TIMES DAILY. 02/25/24  Yes Patel, Donika K, DO  gabapentin  (NEURONTIN ) 400 MG capsule Take 1 capsule (400 mg total) by mouth daily in the afternoon for 3 days. Continue the prior prescription of 100 mg twice a day in addition to the 400 mg once daily in the afternoon for a total of 600 mg daily which is the maximum allowed due to chronic kidney condition 03/01/24 03/04/24 Yes Iola Lukes, FNP  HYDROcodone -acetaminophen  (NORCO/VICODIN) 5-325 MG tablet Take 1 tablet by mouth every 6 (six) hours as needed for moderate pain (pain  score 4-6).   Yes [provider]  ipratropium (ATROVENT ) 0.03 % nasal spray    Yes [provider]  levothyroxine  (SYNTHROID ) 88 MCG tablet TAKE 1 TABLET(88 MCG) BY MOUTH DAILY BEFORE BREAKFAST 07/08/20  Yes Trixie File, MD  memantine  (NAMENDA ) 5 MG tablet Take 5 mg by mouth 2 (two) times daily.   Yes [provider]  metolazone  (ZAROXOLYN ) 2.5 MG tablet TAKE 1 TABLET BY MOUTH ONCE A WEEK 07/07/23  Yes Bensimhon, Toribio SAUNDERS, MD  midodrine  (PROAMATINE ) 5 MG tablet Take 1 tablet (5 mg total) by mouth 3 (three) times daily. 02/25/24  Yes Milford, Harlene HERO, FNP  Multiple Vitamin (MULTIVITAMIN WITH MINERALS) TABS tablet Take 1 tablet by mouth daily.   Yes [provider]  ondansetron  (ZOFRAN ) 4 MG tablet TAKE 1 TABLET (4 MG TOTAL) BY MOUTH EVERY 6 (SIX) HOURS.   Yes [provider]  pantoprazole  (PROTONIX ) 40 MG tablet TAKE ONE TABLET BY MOUTH ONCE DAILY FOR ACID REFLUX   Yes [provider]  potassium chloride  SA (KLOR-CON  M) 20 MEQ tablet Take 3 tablets (60 mEq total) by mouth 2 (two) times daily. 07/14/23  Yes Sabharwal, Aditya, DO  simvastatin  (ZOCOR ) 10 MG tablet Take 1 tablet (10 mg total) by mouth every evening. 02/25/24  Yes Milford, Harlene HERO, FNP  spironolactone  (ALDACTONE ) 25 MG tablet TAKE 1 TABLET (25 MG TOTAL) BY MOUTH EVERY EVENING. 11/05/23   Yes Bensimhon, Toribio SAUNDERS, MD  tirzepatide  (MOUNJARO ) 12.5 MG/0.5ML Pen Inject 12.5 mg into the skin once a week. 12/15/23  Yes Bensimhon, Toribio SAUNDERS, MD  tiZANidine  (ZANAFLEX ) 2 MG tablet 1 tablet at bedtime as needed Oral Once a day; Duration: 30 days   Yes [provider]  torsemide  (DEMADEX ) 100 MG tablet Take 1 tablet (100 mg total) by mouth 2 (two) times daily. 08/04/23  Yes Milford, Harlene HERO, FNP  albuterol  (VENTOLIN  HFA) 108 (90 Base) MCG/ACT inhaler INHALE TWO PUFFS BY MOUTH EVERY 4 HOURS AS NEEDED Patient not taking: Reported on 03/01/2024    [provider]  allopurinol  (ZYLOPRIM ) 100 MG tablet TAKE 1/2 TABLET (50 MG TOTAL) BY MOUTH DAILY. Patient not taking: Reported on 03/01/2024 10/07/23   Bensimhon, Toribio SAUNDERS, MD  amoxicillin  (AMOXIL ) 875 MG tablet TAKE 1 TABLET BY MOUTH TWICE A DAY 7 DAYS Patient not taking: Reported on 03/01/2024    [provider]  COMIRNATY syringe  12/24/23   [provider]  doxycycline  (VIBRAMYCIN ) 100 MG capsule Take 100 mg by mouth 2 (two) times daily. Patient not taking: Reported on 03/01/2024    [provider]  febuxostat  (ULORIC ) 40 MG tablet 1 tablet Orally Once a day Patient not taking: Reported on 03/01/2024    [provider]  ferrous sulfate  325 (65 FE) MG tablet 1 tablet Orally Once a day Patient not taking: Reported on 03/01/2024    [provider]  fludrocortisone  (FLORINEF ) 0.1 MG tablet     [provider]  fluticasone  (FLONASE ) 50 MCG/ACT nasal spray Place 1 spray into both nostrils daily.    [provider]  Furosemide  (FUROSCIX ) 80 MG/10ML CTKT Inject 80 mg into the skin daily as needed. As directed by the heart failure clinic Patient not taking: Reported on 03/01/2024 01/19/24   Glena Harlene HERO, FNP  hydrOXYzine  (VISTARIL ) 25 MG capsule     [provider]  lisinopril  (ZESTRIL ) 2.5 MG tablet 1 tablet Orally Once a day Patient not taking: Reported on 03/01/2024     [provider]  methocarbamol  (ROBAXIN ) 500 MG tablet 1.5 tablets Orally every 4 hrs Patient not taking: Reported on 03/01/2024    [provider]  mirtazapine  (REMERON ) 30 MG tablet     [provider]  montelukast  (SINGULAIR ) 10 MG tablet     [provider]  nicotine polacrilex (COMMIT) 2 MG lozenge as directed Mouth/Throat every 4 hours Patient not taking: Reported on 03/01/2024 08/20/23   [provider]  nystatin (MYCOSTATIN) 100000 UNIT/ML suspension 4 mL swish and swallow Mouth/Throat Four times a day; Duration: 14 days Patient not taking: Reported on 03/01/2024 07/22/22   [provider]  phenol (CHLORASEPTIC) 1.4 % LIQD Use as directed 1 spray in the mouth or throat as needed for throat irritation / pain. Patient not taking: Reported on 03/01/2024    [provider]  risperiDONE  (RISPERDAL ) 0.5 MG tablet Take 1 tablet (0.5 mg total) by mouth 2 (two) times daily at 8 am and 4 pm. Patient not taking: Reported on 03/01/2024 09/09/23   Arfeen, Leni DASEN, MD  traMADol DANNY) 50 MG tablet     [provider]  zolpidem  (AMBIEN ) 10 MG tablet 1 tablet at bedtime as needed Orally Once a day Patient not taking: Reported on 03/01/2024    [provider]    Family History Family History  Problem Relation Age of Onset   Alzheimer's disease Mother    CVA Mother    Hypertension Mother    Cancer - Colon Father        late 58   Birth defects Paternal Uncle     Social History Social History   Tobacco Use   Smoking status: Every Day    Current packs/day: 0.00    Average packs/day: 0.5 packs/day for 26.0 years (13.0 ttl pk-yrs)    Types: Cigarettes    Start date: 03/24/1995    Last attempt to quit: 03/23/2021    Years since quitting: 2.9   Smokeless tobacco: Never  Vaping Use   Vaping status: Never Used  Substance Use Topics   Alcohol use: No   Drug use: No     Allergies   Ceftin, Cefuroxime axetil, Geodon  [ziprasidone hcl], Lisinopril , Shellfish allergy, Shellfish-derived products, Cefuroxime, Sulfacetamide sodium-sulfur , Ziprasidone, Allopurinol , Diazepam , Lorazepam , and Sulfa antibiotics   Review of Systems Review of Systems   Physical Exam Triage Vital Signs ED Triage Vitals  Encounter Vitals Group     BP 03/01/24 0918 112/67     Girls Systolic BP Percentile --      Girls Diastolic BP Percentile --      Boys Systolic BP Percentile --      Boys Diastolic BP Percentile --      Pulse Rate 03/01/24 0918 82     Resp 03/01/24 0918 18     Temp 03/01/24 0918 98.4 F (36.9 C)     Temp Source 03/01/24 0918 Oral     SpO2 03/01/24 0918 96 %     Weight --      Height --      Head Circumference --      Peak Flow --      Pain Score 03/01/24 0907 10     Pain Loc --      Pain Education --      Exclude from Growth Chart --    No data found.  Updated Vital Signs BP 112/67 (BP Location: Left Arm)   Pulse 82   Temp 98.4 F (36.9 C) (Oral)  Resp 18   SpO2 96%   Visual Acuity Right Eye Distance:   Left Eye Distance:   Bilateral Distance:    Right Eye Near:   Left Eye Near:    Bilateral Near:     Physical Exam Vitals reviewed.  Constitutional:      General: She is awake. She is not in acute distress.    Appearance: Normal appearance. She is well-developed and well-groomed. She is not ill-appearing, toxic-appearing or diaphoretic.  HENT:     Head: Normocephalic.     Right Ear: Hearing normal.     Left Ear: Hearing normal.     Nose: Nose normal.     Mouth/Throat:     Mouth: Mucous membranes are moist.  Eyes:     General: Vision grossly intact.     Conjunctiva/sclera: Conjunctivae normal.  Cardiovascular:     Rate and Rhythm: Normal rate and regular rhythm.     Pulses:          Dorsalis pedis pulses are 2+ on the right side and 2+ on the left side.     Heart sounds: Normal heart sounds.  Pulmonary:     Effort: Pulmonary effort is normal.     Breath sounds: Normal  breath sounds and air entry.  Musculoskeletal:        General: Normal range of motion.     Cervical back: Normal range of motion and neck supple.     Right foot: Normal range of motion and normal capillary refill. Tenderness present. No swelling, deformity or bony tenderness.     Left foot: Normal range of motion and normal capillary refill. Tenderness present. No swelling, deformity or bony tenderness.  Feet:     Right foot:     Skin integrity: Skin integrity normal.     Left foot:     Skin integrity: Skin integrity normal.  Skin:    General: Skin is warm and dry.  Neurological:     General: No focal deficit present.     Mental Status: She is alert. Mental status is at baseline.     Gait: Gait is intact.  Psychiatric:        Speech: Speech normal.        Behavior: Behavior is cooperative.      UC Treatments / Results  Labs (all labs ordered are listed, but only abnormal results are displayed) Labs Reviewed - No data to display  EKG   Radiology No results found.  Procedures Procedures (including critical care time)  Medications Ordered in UC Medications  dexamethasone  (DECADRON ) injection 4 mg (has no administration in time range)    Initial Impression / Assessment and Plan / UC Course  I have reviewed the triage vital signs and the nursing notes.  Pertinent labs & imaging results that were available during my care of the patient were reviewed by me and considered in my medical decision making (see chart for details).     Patient with known diabetic neuropathy presents with worsening neuropathic pain, numbness, and tingling in both feet after being off gabapentin  for one week due to lapse in medication pickup. History limited by dementia, provided primarily by husband and chart review. She normally takes gabapentin  100 mg twice daily, prescribed by neurology, with an additional 800 mg PRN that has not been recently used. Refill was sent on 02/25/24 but has not yet  been retrieved.  Given her history of CKD with most recent GFR of 22 (02/23/24), maximum safe daily  dose of gabapentin  is 600 mg. Husband instructed to pick up the prescription from the pharmacy. For acute painful diabetic neuropathy symptoms, an additional 400 mg gabapentin  was prescribed for afternoon dosing over the next 3 days, to be taken in addition to her routine 100 mg BID. Decadron  4 mg IM administered in clinic for symptomatic relief. Supportive care reviewed.  Patient advised to follow up with PCP and/or neurology as needed. Husband verbalized understanding and agreement with plan.  Today's evaluation has revealed no signs of a dangerous process. Discussed diagnosis with patient and/or guardian. Patient and/or guardian aware of their diagnosis, possible red flag symptoms to watch out for and need for close follow up. Patient and/or guardian understands verbal and written discharge instructions. Patient and/or guardian comfortable with plan and disposition.  Patient and/or guardian has a clear mental status at this time, good insight into illness (after discussion and teaching) and has clear judgment to make decisions regarding their care  Documentation was completed with the aid of voice recognition software. Transcription may contain typographical errors.  Final Clinical Impressions(s) / UC Diagnoses   Final diagnoses:  Acute painful diabetic neuropathy [facility])     Discharge Instructions      Your wife was seen today for increased pain, numbness, and tingling in her feet related to her diabetic neuropathy. These symptoms worsened after she ran out of her regular gabapentin  medication last week. Because of her kidney function, she should not take more than 600 mg of gabapentin  in a day. To help manage her pain, you were instructed to pick up her gabapentin  prescription from the pharmacy today. She should take gabapentin  100 mg in the morning and 100 mg in the evening as prescribed. In  addition, for the next three days only, she should take one extra 400 mg gabapentin  tablet in the afternoon. She also received a steroid injection (Decadron ) in the clinic today to help with pain and inflammation. Please continue giving her medications as prescribed. If her symptoms do not improve, or if they worsen, you should schedule follow-up with her primary care doctor or neurologist. She should go to the emergency department immediately if she develops new confusion, severe weakness, loss of ability to walk, chest pain, trouble breathing, or any other sudden or concerning symptoms.     ED Prescriptions     Medication Sig Dispense Auth. Provider   gabapentin  (NEURONTIN ) 400 MG capsule Take 1 capsule (400 mg total) by mouth daily in the afternoon for 3 days. Continue the prior prescription of 100 mg twice a day in addition to the 400 mg once daily in the afternoon for a total of 600 mg daily which is the maximum allowed due to chronic kidney condition 3 capsule Iola Lukes, FNP      PDMP not reviewed this encounter.   Iola Lukes, [place] 03/01/24 [phone]

## 2024-03-01 NOTE — Progress Notes (Signed)
 ReDS Vest / Clip - 03/01/24 1500       ReDS Vest / Clip   Station Marker A    Ruler Value 30.5    ReDS Value Range Low volume    ReDS Actual Value 26

## 2024-03-02 ENCOUNTER — Ambulatory Visit (HOSPITAL_COMMUNITY): Payer: Self-pay | Admitting: Physician Assistant

## 2024-03-02 ENCOUNTER — Other Ambulatory Visit: Payer: Self-pay | Admitting: Licensed Clinical Social Worker

## 2024-03-06 ENCOUNTER — Encounter: Payer: Self-pay | Admitting: Licensed Clinical Social Worker

## 2024-03-06 NOTE — Patient Outreach (Signed)
 SW had  to call the PCP's office because PCP was not listed in the in-basket to send a message. SW las called the DSS CM  Rexene Spiegel [phone] and left a message  Tobias CHARM [name], PhD [facility], [facility] Social Worker Direct Dial: [phone]  Fax: [phone]

## 2024-03-06 NOTE — Patient Instructions (Signed)
 Visit Information  Thank you for taking time to visit with me today. Please don't hesitate to contact me if I can be of assistance to you before our next scheduled appointment.  Our next appointment is by telephone on 03/15/2024 at 2:15 pm Please call the care guide team at [phone] if you need to cancel or reschedule your appointment.   Following is a copy of your care plan:   Goals Addressed             This Visit's Progress    BSW VBCI Social Work Care Plan       Problems:   Needing to increase HHA hours  CSW Clinical Goal(s):   Over the next 2 weeks the Caregiver will work with PCP to address needs related to Extra Hours needed.  Interventions:  SW will email the PCP  Patient Goals/Self-Care Activities:  Coordinate with PCP and DSS CM  to assist with increase HHA hours.  Plan:   Telephone follow up appointment with care management team member scheduled for:  03/15/2024 at 2:15 pm        Please call the Suicide and Crisis Lifeline: 988 go to [facility] [address] [place] [phone]) call 911 if you are experiencing a Mental Health or Behavioral Health Crisis or need someone to talk to.  Patient verbalizes understanding of instructions and care plan provided today and agrees to view in MyChart. Active MyChart status and patient understanding of how to access instructions and care plan via MyChart confirmed with patient.     Rhonda [name] Rhonda [name] [name], PhD [facility], [facility] Social Worker Direct Dial: [phone]  Fax: [phone]

## 2024-03-06 NOTE — Patient Outreach (Signed)
 Complex Care Management   Visit Note  03/06/2024  Name:  Rhonda [name] MRN: [id number] DOB: Aug 21, 1950  Situation: Referral received for Complex Care Management related to SDOH Barriers:  Increased HHA Hours I obtained verbal consent from Caregiver Patient.  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   Body mass index (BMI) 45.0-49.9, adult 06/11/2020   Bowel incontinence 06/11/2020   Cardiomyopathy    Cholelithiasis 06/11/2020   Chronic respiratory failure 09/14/2013   Chronic sinusitis 06/11/2020   Chronic systolic heart failure 10/27/2012   a) NICM b) ECHO (03/2013) EF 20-25% c) ECHO (09/2013) EF 45-50%, grade I DD   CKD (chronic kidney disease) stage 3, GFR 30-59 ml/min 11/08/2018   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   Complication of anesthesia    History of low blood pressure after surgery; attributed to lying flat   Daytime somnolence 06/11/2020   Decreased estrogen level 08/01/2021   Diabetic neuropathy    Diarrhea of presumed infectious origin 06/11/2020   Edema 06/11/2020   Elevated troponin 12/02/2021   Endometrial polyp 01/20/2012   Exertional shortness of breath    Generalized anxiety disorder    GERD (gastroesophageal reflux disease)    Gout 06/11/2020   Heart murmur    History of colonic polyps 06/11/2020   History of fall 06/11/2020   Hyperlipidemia    Hyperparathyroidism 08/01/2021   Hypertension    Hypokalemia 11/08/2018   Hyponatremia 10/08/2021   Hypothyroidism    Insomnia 06/11/2020    Intractable nausea and vomiting 10/08/2021   Irritable bowel syndrome 01/04/2020   Left bundle branch block    s/p CRT-D (04/2013)   Lumbago without sciatica 04/30/2021   Lumbar spondylosis with myelopathy 06/11/2020   Macrocytosis 06/11/2020   Major depressive disorder    Malignant tumor of breast 03/26/2011   Left; completed chemotherapy and radiation treatments   Migraines    Mild dementia 04/20/2023   On home oxygen  therapy    2L suppose to be q night (05/03/2013)   Orthostatic hypotension 07/28/2017   Spinal stenosis of lumbar region 01/03/2020   Spondylolisthesis 08/01/2021   SVD (spontaneous vaginal delivery)    x 2   Type II diabetes mellitus 06/15/2019   Varicose veins of bilateral lower extremities with other complications 08/01/2021   Vitamin D  deficiency 03/26/2011    Assessment: Patient has Dementia and is cared for by her husband, Patient is currently getting HHA twice a week Tues and Friday 2 hours each day form [employer] [phone], through DSS. Spouse wants increase hours to assist his wife. She can bath herself, but there are times that she  will forget to take her medications and eat. SW will in box the PCP   SDOH Interventions    Flowsheet Row Patient Outreach Telephone from 03/02/2024 in [place] POPULATION HEALTH DEPARTMENT Patient Outreach Telephone from 10/29/2023 in [place] POPULATION HEALTH DEPARTMENT Patient Outreach from 09/17/2023 in [place] POPULATION HEALTH DEPARTMENT Care Coordination from 09/02/2023 in [place] POPULATION HEALTH DEPARTMENT Care  Coordination from 03/31/2023 in [facility] Care Coordination Care Coordination from 08/11/2022 in [facility] [employer] [facility] Care Coordination  SDOH Interventions        Food Insecurity Interventions Intervention Not Indicated Intervention Not Indicated Intervention Not Indicated Intervention Not Indicated Intervention Not Indicated Intervention Not Indicated   Housing Interventions Intervention Not Indicated Intervention Not Indicated Intervention Not Indicated -- Intervention Not Indicated Intervention Not Indicated  Transportation Interventions Intervention Not Indicated Intervention Not Indicated Intervention Not Indicated Intervention Not Indicated Intervention Not Indicated Intervention Not Indicated  Utilities Interventions Intervention Not Indicated Intervention Not Indicated Intervention Not Indicated Intervention Not Indicated -- Intervention Not Indicated  Alcohol Usage Interventions -- -- Intervention Not Indicated (Score <7) -- -- --  Financial Strain Interventions -- -- Intervention Not Indicated -- -- --  Physical Activity Interventions -- -- Intervention Not Indicated -- -- --  Stress Interventions -- -- Other (Comment)  [active with VBCI SW [facility] [place] DSS, [name]] -- -- Intervention Not Indicated  Social Connections Interventions -- -- Intervention Not Indicated -- -- --  Health Literacy Interventions -- -- Other (Comment), Intervention Not Indicated  [spouse assists as patient with memory issues] -- -- --         Recommendation:   none  Follow Up Plan:   Telephone follow up appointment date/time:  03/15/2024 at 2:15 pm  Tobias CHARM [name], PhD [facility], [facility] Social Worker Direct Dial: [phone]  Fax: [phone]

## 2024-03-08 ENCOUNTER — Ambulatory Visit: Attending: Internal Medicine

## 2024-03-08 ENCOUNTER — Other Ambulatory Visit (HOSPITAL_COMMUNITY): Payer: Self-pay

## 2024-03-08 DIAGNOSIS — E039 Hypothyroidism, unspecified: Secondary | ICD-10-CM | POA: Diagnosis not present

## 2024-03-08 DIAGNOSIS — F329 Major depressive disorder, single episode, unspecified: Secondary | ICD-10-CM | POA: Diagnosis not present

## 2024-03-08 DIAGNOSIS — F411 Generalized anxiety disorder: Secondary | ICD-10-CM | POA: Diagnosis not present

## 2024-03-08 DIAGNOSIS — R011 Cardiac murmur, unspecified: Secondary | ICD-10-CM | POA: Diagnosis not present

## 2024-03-08 DIAGNOSIS — Z95810 Presence of automatic (implantable) cardiac defibrillator: Secondary | ICD-10-CM

## 2024-03-08 DIAGNOSIS — Z5982 Transportation insecurity: Secondary | ICD-10-CM | POA: Diagnosis not present

## 2024-03-08 DIAGNOSIS — Z79890 Hormone replacement therapy (postmenopausal): Secondary | ICD-10-CM | POA: Diagnosis not present

## 2024-03-08 DIAGNOSIS — I951 Orthostatic hypotension: Secondary | ICD-10-CM | POA: Diagnosis not present

## 2024-03-08 DIAGNOSIS — I5022 Chronic systolic (congestive) heart failure: Secondary | ICD-10-CM

## 2024-03-08 DIAGNOSIS — E876 Hypokalemia: Secondary | ICD-10-CM | POA: Diagnosis not present

## 2024-03-08 DIAGNOSIS — I509 Heart failure, unspecified: Secondary | ICD-10-CM | POA: Diagnosis not present

## 2024-03-08 DIAGNOSIS — Z882 Allergy status to sulfonamides status: Secondary | ICD-10-CM | POA: Diagnosis not present

## 2024-03-08 DIAGNOSIS — Z720 Tobacco use: Secondary | ICD-10-CM | POA: Diagnosis not present

## 2024-03-08 DIAGNOSIS — E669 Obesity, unspecified: Secondary | ICD-10-CM | POA: Diagnosis not present

## 2024-03-08 DIAGNOSIS — L309 Dermatitis, unspecified: Secondary | ICD-10-CM | POA: Diagnosis not present

## 2024-03-08 DIAGNOSIS — E785 Hyperlipidemia, unspecified: Secondary | ICD-10-CM | POA: Diagnosis not present

## 2024-03-08 DIAGNOSIS — G629 Polyneuropathy, unspecified: Secondary | ICD-10-CM | POA: Diagnosis not present

## 2024-03-08 DIAGNOSIS — M109 Gout, unspecified: Secondary | ICD-10-CM | POA: Diagnosis not present

## 2024-03-08 DIAGNOSIS — Z6835 Body mass index (BMI) 35.0-35.9, adult: Secondary | ICD-10-CM | POA: Diagnosis not present

## 2024-03-08 DIAGNOSIS — N1832 Chronic kidney disease, stage 3b: Secondary | ICD-10-CM | POA: Diagnosis not present

## 2024-03-08 NOTE — Progress Notes (Signed)
 Paramedicine Encounter    Patient ID: Rhonda [name], female    DOB: [birth year], 73 y.o.   MRN: [id number]   Complaints- tired   Assessment- Caox4, warm and dry seated in her chair in her room resting, mild lower leg swelling, weight down 7lbs, lungs clear, no abdominal distention.   Compliance with meds- missed two noon doses   Pill box filled- for one week   Refills needed- levothyroxine , citalopram   Meds changes since last visit- none     Social changes- none    VISIT SUMMARY- Arrived for home visit for Rhonda [name] reports to be tired and not feeling her best today but unable to endorse any complaints. Vitals were obtained and assessment as noted. Mild lower leg swelling noted, weight down 7lbs in one week, she missed two noon doses of meds. She reports having a minimal appetite over the last week. I reminded her the importance of getting her legs up and elevated and using her compression socks as well as limiting her sodium and fluids. She agreed. Meds reviewed and pill box filled for one week. Appointments reviewed. Home visit complete. I will see her in one week.   BP 130/80   Pulse 86   Resp 16   Wt 197 lb 8 oz (89.6 kg)   SpO2 97%   BMI 36.12 kg/m  Weight yesterday-- didn't weigh  Last visit weight-- 204      ACTION: Home visit completed     Patient Care Team: Chrystal Lamarr GORMAN, MD as PCP - General (Family Medicine) Waddell Danelle ORN, MD as PCP - Electrophysiology (Cardiology) Cathern Andriette DEL, LCSW as Social Worker (Licensed Clinical Social Worker) Tobie, Tonita POUR, DO as Consulting Physician (Neurology) Jacques Moats, Paramedic as Paramedic Chrystal, Lamarr GORMAN, MD as Attending Physician (Family Medicine) Bensimhon, Toribio SAUNDERS, MD as Consulting Physician (Cardiology) Curry, Leni DASEN, MD as Consulting Physician (Psychiatry) Tobie Franky SQUIBB, DPM as Consulting Physician (Podiatry) Aniceto Daphne CROME, NP as Nurse Practitioner (Cardiology) Gaynel Delon CROME, DPM as  Consulting Physician (Podiatry) Slusher, Santana LABOR, RN as Registered Nurse Maranda Tobias BIRCH as Social Worker  Patient Active Problem List   Diagnosis Date Noted   Mild dementia 04/20/2023   Diabetic neuropathy    Elevated troponin 12/02/2021   Acute left ankle pain 10/11/2021   Hyponatremia 10/08/2021   Decreased estrogen level 08/01/2021   Hyperparathyroidism 08/01/2021   Spondylolisthesis 08/01/2021   Varicose veins of bilateral lower extremities with other complications 08/01/2021   Lumbago without sciatica 04/30/2021   Abnormal gait 06/11/2020   Allergic rhinitis 06/11/2020   Asthma 06/11/2020   Benign intracranial hypertension 06/11/2020   Body mass index (BMI) 45.0-49.9, adult 06/11/2020   Bowel incontinence 06/11/2020   Cholelithiasis 06/11/2020   Chronic sinusitis 06/11/2020   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   Daytime somnolence 06/11/2020   Edema 06/11/2020   Family history of malignant neoplasm of gastrointestinal tract 06/11/2020   Gout 06/11/2020   Insomnia 06/11/2020   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   Lumbar spondylosis with myelopathy 06/11/2020   Macrocytosis 06/11/2020   History of colonic polyps 06/11/2020   Repeated falls 06/11/2020   Irritable bowel syndrome 01/04/2020   Spinal stenosis of lumbar region 01/03/2020   Bilateral leg weakness 08/01/2019   Type II diabetes mellitus 06/15/2019   Hypokalemia 11/08/2018   CKD (chronic kidney disease) stage 3, GFR 30-59 ml/min 11/08/2018   Orthostatic hypotension 07/28/2017   On home oxygen  therapy    Migraines    Left bundle branch block  Hypothyroidism    Hypertension    Hyperlipidemia    Heart murmur    GERD (gastroesophageal reflux disease)    Exertional shortness of breath    Major depressive disorder    Back pain    Arthritis    Generalized anxiety disorder    Anemia    Chronic respiratory failure 09/14/2013   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   Chronic  systolic heart failure 10/27/2012   Endometrial polyp 01/20/2012   Malignant tumor of breast 03/26/2011   Vitamin D  deficiency 03/26/2011    Current Outpatient Medications:    acetaminophen  (TYLENOL ) 500 MG tablet, Take 500 mg by mouth every 6 (six) hours as needed., Disp: , Rfl:    acetaZOLAMIDE  (DIAMOX ) 125 MG tablet, Take 1 tablet (125 mg total) by mouth daily., Disp: 90 tablet, Rfl: 3   albuterol  (VENTOLIN  HFA) 108 (90 Base) MCG/ACT inhaler, INHALE TWO PUFFS BY MOUTH EVERY 4 HOURS AS NEEDED, Disp: , Rfl:    allopurinol  (ZYLOPRIM ) 100 MG tablet, TAKE 1/2 TABLET (50 MG TOTAL) BY MOUTH DAILY., Disp: 45 tablet, Rfl: 1   azelastine  (ASTELIN ) 0.1 % nasal spray, Place 2 sprays into both nostrils 2 (two) times daily. Use in each nostril as directed, Disp: 30 mL, Rfl: 12   buPROPion  (WELLBUTRIN  XL) 150 MG 24 hr tablet, Take 1 tablet (150 mg total) by mouth daily., Disp: 30 tablet, Rfl: 5   Capsaicin 0.1 % CREA, 1 application as needed Externally Three times a day as needed, Disp: , Rfl:    carvedilol  (COREG ) 3.125 MG tablet, TAKE 1 TABLET (3.125 MG) BY MOUTH TWICE DAILY WITH MEALS, Disp: 180 tablet, Rfl: 3   citalopram  (CELEXA ) 20 MG tablet, Take 1 tablet (20 mg total) by mouth daily., Disp: 30 tablet, Rfl: 5   clonazePAM  (KLONOPIN ) 0.5 MG tablet, Take 1 tablet (0.5 mg total) by mouth at bedtime., Disp: 30 tablet, Rfl: 5   cyanocobalamin  (VITAMIN B12) 1000 MCG tablet, Take 1,000 mcg by mouth daily., Disp: , Rfl:    desloratadine  (CLARINEX ) 5 MG tablet, TAKE 1 TABLET (5 MG TOTAL) BY MOUTH DAILY FOR ALLERGIES, Disp: 90 tablet, Rfl: 3   famotidine (PEPCID) 20 MG tablet, Take 20 mg by mouth as needed for heartburn or indigestion., Disp: , Rfl:    ferrous sulfate  325 (65 FE) MG tablet, 1 tablet Orally Once a day, Disp: , Rfl:    fluticasone  (FLONASE ) 50 MCG/ACT nasal spray, Place 1 spray into both nostrils daily., Disp: , Rfl:    Furosemide  (FUROSCIX ) 80 MG/10ML CTKT, Inject 80 mg into the skin daily as  needed. As directed by the heart failure clinic, Disp: 5 each, Rfl: 1   gabapentin  (NEURONTIN ) 100 MG capsule, TAKE 1 CAPSULE (100 MG TOTAL) BY MOUTH 2 (TWO) TIMES DAILY., Disp: 180 capsule, Rfl: 0   ipratropium (ATROVENT ) 0.03 % nasal spray, , Disp: , Rfl:    levothyroxine  (SYNTHROID ) 88 MCG tablet, TAKE 1 TABLET(88 MCG) BY MOUTH DAILY BEFORE BREAKFAST, Disp: 90 tablet, Rfl: 3   memantine  (NAMENDA ) 5 MG tablet, Take 5 mg by mouth 2 (two) times daily., Disp: , Rfl:    metolazone  (ZAROXOLYN ) 2.5 MG tablet, TAKE 1 TABLET BY MOUTH ONCE A WEEK, Disp: 4 tablet, Rfl: 1   midodrine  (PROAMATINE ) 5 MG tablet, Take 1 tablet (5 mg total) by mouth 3 (three) times daily., Disp: 200 tablet, Rfl: 3   mirtazapine  (REMERON ) 30 MG tablet, , Disp: , Rfl:    Multiple Vitamin (MULTIVITAMIN WITH  MINERALS) TABS tablet, Take 1 tablet by mouth daily., Disp: , Rfl:    phenol (CHLORASEPTIC) 1.4 % LIQD, Use as directed 1 spray in the mouth or throat as needed for throat irritation / pain., Disp: , Rfl:    potassium chloride  SA (KLOR-CON  M) 20 MEQ tablet, Take 3 tablets (60 mEq total) by mouth 2 (two) times daily., Disp: 572 tablet, Rfl: 3   risperiDONE  (RISPERDAL ) 0.5 MG tablet, Take 1 tablet (0.5 mg total) by mouth 2 (two) times daily at 8 am and 4 pm., Disp: 60 tablet, Rfl: 5   simvastatin  (ZOCOR ) 10 MG tablet, Take 1 tablet (10 mg total) by mouth every evening., Disp: 90 tablet, Rfl: 3   spironolactone  (ALDACTONE ) 25 MG tablet, TAKE 1 TABLET (25 MG TOTAL) BY MOUTH EVERY EVENING., Disp: 90 tablet, Rfl: 3   tirzepatide  (MOUNJARO ) 12.5 MG/0.5ML Pen, Inject 12.5 mg into the skin once a week., Disp: 2 mL, Rfl: 0   torsemide  (DEMADEX ) 100 MG tablet, Take 1 tablet (100 mg total) by mouth 2 (two) times daily., Disp: 180 tablet, Rfl: 3   amoxicillin  (AMOXIL ) 875 MG tablet, TAKE 1 TABLET BY MOUTH TWICE A DAY 7 DAYS (Patient not taking: Reported on 03/08/2024), Disp: , Rfl:    COMIRNATY syringe, , Disp: , Rfl:    dicyclomine  (BENTYL )  20 MG tablet, Take 20 mg by mouth 3 (three) times daily before meals. (Patient not taking: Reported on 03/08/2024), Disp: , Rfl:    doxycycline  (VIBRAMYCIN ) 100 MG capsule, Take 100 mg by mouth 2 (two) times daily. (Patient not taking: Reported on 03/08/2024), Disp: , Rfl:    febuxostat  (ULORIC ) 40 MG tablet, 1 tablet Orally Once a day (Patient not taking: Reported on 03/08/2024), Disp: , Rfl:    fludrocortisone  (FLORINEF ) 0.1 MG tablet, , Disp: , Rfl:    gabapentin  (NEURONTIN ) 400 MG capsule, Take 1 capsule (400 mg total) by mouth daily in the afternoon for 3 days. Continue the prior prescription of 100 mg twice a day in addition to the 400 mg once daily in the afternoon for a total of 600 mg daily which is the maximum allowed due to chronic kidney condition (Patient not taking: Reported on 03/08/2024), Disp: 3 capsule, Rfl: 0   HYDROcodone -acetaminophen  (NORCO/VICODIN) 5-325 MG tablet, Take 1 tablet by mouth every 6 (six) hours as needed for moderate pain (pain score 4-6). (Patient not taking: Reported on 03/08/2024), Disp: , Rfl:    hydrOXYzine  (VISTARIL ) 25 MG capsule, , Disp: , Rfl:    lisinopril  (ZESTRIL ) 2.5 MG tablet, 1 tablet Orally Once a day (Patient not taking: Reported on 03/08/2024), Disp: , Rfl:    methocarbamol  (ROBAXIN ) 500 MG tablet, 1.5 tablets Orally every 4 hrs (Patient not taking: Reported on 03/08/2024), Disp: , Rfl:    montelukast  (SINGULAIR ) 10 MG tablet, , Disp: , Rfl:    nicotine polacrilex (COMMIT) 2 MG lozenge, as directed Mouth/Throat every 4 hours (Patient not taking: Reported on 03/08/2024), Disp: , Rfl:    nystatin (MYCOSTATIN) 100000 UNIT/ML suspension, 4 mL swish and swallow Mouth/Throat Four times a day; Duration: 14 days (Patient not taking: Reported on 03/08/2024), Disp: , Rfl:    ondansetron  (ZOFRAN ) 4 MG tablet, TAKE 1 TABLET (4 MG TOTAL) BY MOUTH EVERY 6 (SIX) HOURS. (Patient not taking: Reported on 03/08/2024), Disp: , Rfl:    pantoprazole  (PROTONIX ) 40 MG  tablet, TAKE ONE TABLET BY MOUTH ONCE DAILY FOR ACID REFLUX (Patient not taking: Reported on 03/08/2024), Disp: , Rfl:  tiZANidine  (ZANAFLEX ) 2 MG tablet, 1 tablet at bedtime as needed Oral Once a day; Duration: 30 days (Patient not taking: Reported on 03/08/2024), Disp: , Rfl:    traMADol (ULTRAM) 50 MG tablet, , Disp: , Rfl:    zolpidem  (AMBIEN ) 10 MG tablet, 1 tablet at bedtime as needed Orally Once a day (Patient not taking: Reported on 03/08/2024), Disp: , Rfl:  Allergies  Allergen Reactions   Ceftin Anaphylaxis    Face and throat swell    Cefuroxime Axetil Anaphylaxis    Face and throat swell   Geodon [Ziprasidone Hcl] Hives   Lisinopril  Other (See Comments)    angioedema  lisinopril   Other Reaction(s): angioedema   Shellfish Allergy Other (See Comments)    Gout exacerbation  shellfish derived   Shellfish Protein-Containing Drug Products Other (See Comments)    Other reaction(s): Other  shellfish derived   Cefuroxime     Other reaction(s): anaphylaxis  Other Reaction(s): Unknown   Sulfacetamide Sodium-Sulfur      Other reaction(s): itch   Ziprasidone     Other reaction(s): hives on left arm  Other Reaction(s): hives on left arm   Allopurinol  Nausea Only and Other (See Comments)    weakness  Other Reaction(s): nausea, weakness   Diazepam  Other (See Comments)    Patient states that diazepam  doesn't relax, it has the opposite effect.  Valium    Lorazepam  Itching   Sulfa Antibiotics Itching     Social History   Socioeconomic History   Marital status: Married    Spouse name: Dallas   Number of children: 2   Years of education: 18   Highest education level: Master's degree (e.g., MA, MS, MEng, MEd, MSW, MBA)  Occupational History   Occupation: retired  Tobacco Use   Smoking status: Every Day    Current packs/day: 0.00    Average packs/day: 0.5 packs/day for 26.0 years (13.0 ttl pk-yrs)    Types: Cigarettes    Start date: 03/24/1995    Last attempt to  quit: 03/23/2021    Years since quitting: 2.9   Smokeless tobacco: Never  Vaping Use   Vaping status: Never Used  Substance and Sexual Activity   Alcohol use: No   Drug use: No   Sexual activity: Yes  Other Topics Concern   Not on file  Social History Narrative   Tobacco Use Cigarettes: Former Smoker, Quit in 2008   No Alcohol   No recreational drug use   Diet: Regular/Low Carb   Exercise: None   Occupation: disabled   Education: Company secretary, masters   Children: 2   Firearms: No   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   Two story home   Lives with spouse Dallas      Social Drivers of Health   Financial Resource Strain: Low Risk  (09/17/2023)   Overall Financial Resource Strain (CARDIA)    Difficulty of Paying Living Expenses: Not very hard  Food Insecurity: No Food Insecurity (03/06/2024)   Hunger Vital Sign    Worried About Running Out of Food in the Last Year: Never true    Ran Out of Food in the Last Year: Never true  Transportation Needs: No Transportation Needs (03/06/2024)   PRAPARE - Administrator, Civil Service (Medical): No    Lack of Transportation (Non-Medical): No  Physical Activity: Insufficiently Active (09/17/2023)   Exercise Vital Sign    Days of Exercise per Week: 1 day  Minutes of Exercise per Session: 20 min  Stress: Stress Concern Present (09/17/2023)   [name] of Occupational Health - Occupational Stress Questionnaire    Feeling of Stress : To some extent  Social Connections: Socially Integrated (09/17/2023)   Social Connection and Isolation Panel    Frequency of Communication with Friends and Family: Twice a week    Frequency of Social Gatherings with Friends and Family: Twice a week    Attends Religious Services: 1 to 4 times per year    Active Member of Clubs or Organizations: Patient unable to answer    Attends Banker Meetings: 1 to 4 times per year    Marital Status: Married   Catering manager Violence: Not At Risk (03/06/2024)   Humiliation, Afraid, Rape, and Kick questionnaire    Fear of Current or Ex-Partner: No    Emotionally Abused: No    Physically Abused: No    Sexually Abused: No    Physical Exam      Future Appointments  Date Time Provider Department Center  03/09/2024  3:20 PM Curry Leni DASEN, MD [facility] None  03/13/2024  7:10 AM CVD HVT DEVICE REMOTES [facility] H&V  03/15/2024  2:15 PM Maranda Lister D [facility] None  03/16/2024  2:45 PM [facility] LAB [facility] None  03/23/2024  9:00 AM Slusher, Santana LABOR, RN [facility] None  03/29/2024  7:05 AM CVD HVT DEVICE REMOTES [facility] H&V  04/19/2024  1:15 PM Gaynel Delon CROME, DPM [facility]  04/26/2024  8:50 AM Tobie Breslow K, DO [facility] None  05/03/2024  3:00 PM [facility] PA/NP [facility] None  06/12/2024  7:10 AM CVD HVT DEVICE REMOTES [facility] H&V  09/11/2024  7:10 AM CVD HVT DEVICE REMOTES [facility] H&V  12/11/2024  7:10 AM CVD HVT DEVICE REMOTES [facility] H&V  03/12/2025  7:10 AM CVD HVT DEVICE REMOTES [facility] H&V

## 2024-03-08 NOTE — Progress Notes (Signed)
 EPIC Encounter for ICM Monitoring  Patient Name: Rhonda [name] is a 73 y.o. female Date: 03/08/2024 Primary Care Physican: Rhonda Lamarr GORMAN, MD Primary Cardiologist: Rhonda [name] Electrophysiologist: Rhonda [name]: 99.8%   06/01/2022 Weight: 193 lbs  04/26/2023 Weight: 214 lbs Per EMT note 07/14/2023 Weight: 226 lbs 10/13/2023 Office Weight: 210 lbs 12/15/2023 Weight: 206.3 lbs per Rhonda [name], paramedic visit 01/12/2024 Weight: 205.8 lbs per Rhonda [name], paramedic visit  01/19/2024 Weight: 203 lbs 02/10/2024 Weight: 202 lbs per Rhonda [name] paramedicine visit   Clinical Status Since 23-Feb-2024 Time in AT/AF  <0.1 hr/day (<0.1%)          Transmission results reviewed.                 Since 02/23/2024 ICM Remote Transmission: Optivol thoracic impedance returned to baseline 02/23/2024 after 3 days of Furoscix .   Prescribed:  Rhonda [name] with paramedicine program assists with meds.   Torsemide  100 mg 1 tablet(s) (100 mg total) by mouth twice a day  Potassium 20 meq 3 tablets (60 mEq total) by mouth 2 (twice) a day.   Metolazone  2.5 mg 1 tablet by mouth once a week (every Tuesday). Spironolactone  25 mg take 1 tablet by mouth every evening.   Labs: 03/16/2024 BMET Scheduled 03/01/2024 Creatinine 2.56, BUN 53, Potassium 4.5, Sodium 132, GFR 16 02/23/2024 Creatinine 2.29, BUN 46, Potassium 3.5, Sodium 134, GFR 22 01/26/2024 Creatinine 1.99, BUN 38, Potassium 3.7, Sodium 135, GFR 26, BNP 29.3 10/25/2023 Creatinine 1.78, BUN 46, Potassium 3.3, Sodium 136, GFR 30 10/13/2023 Creatinine 1.91, BUN 41, Potassium 3.5, Sodium 135, GFR 28, BNP 37.5 07/28/2023 Creatinine 2.06, BUN 52, Potassium 3.5, Sodium 135, eGFR 25 07/14/2023 Creatinine 1.91, BUN 47, Potassium 3.3, Sodium 134, eGFR 28, BNP 71.5 A complete set of results can be found in Results Review.   Recommendations:  No changes.   Copy sent to Rhonda Dutch, PA as requested after 03/01/2024 office visit.    Follow-up  plan: ICM clinic phone appointment on 03/29/2024.   91 day device clinic remote transmission 03/13/2024.     EP/Cardiology Office Visits:   05/03/2024 with HF Clinic.   Recall 05/11/2024 with Dr Cherrie.  Recall 03/21/2024 with Dr Rhonda.     Copy of ICM check sent to Dr. Waddell.   Remote monitoring is medically necessary for Heart Failure Management.    Daily Thoracic Impedance ICM trend: 12/08/2023 through 03/08/2024.    12-14 Month Thoracic Impedance ICM trend:     Rhonda [name] Garner, RN 03/08/2024 5:20 PM

## 2024-03-09 ENCOUNTER — Other Ambulatory Visit (HOSPITAL_COMMUNITY): Payer: Self-pay | Admitting: Internal Medicine

## 2024-03-09 ENCOUNTER — Ambulatory Visit (HOSPITAL_COMMUNITY): Admitting: Psychiatry

## 2024-03-09 ENCOUNTER — Other Ambulatory Visit: Payer: Self-pay

## 2024-03-09 ENCOUNTER — Encounter (HOSPITAL_COMMUNITY): Payer: Self-pay | Admitting: Psychiatry

## 2024-03-09 VITALS — BP 106/71 | HR 76 | Ht 62.5 in | Wt 200.0 lb

## 2024-03-09 DIAGNOSIS — R4189 Other symptoms and signs involving cognitive functions and awareness: Secondary | ICD-10-CM

## 2024-03-09 DIAGNOSIS — F411 Generalized anxiety disorder: Secondary | ICD-10-CM | POA: Diagnosis not present

## 2024-03-09 DIAGNOSIS — F330 Major depressive disorder, recurrent, mild: Secondary | ICD-10-CM

## 2024-03-09 DIAGNOSIS — E1121 Type 2 diabetes mellitus with diabetic nephropathy: Secondary | ICD-10-CM

## 2024-03-09 MED ORDER — CLONAZEPAM 0.5 MG PO TABS: 0.5000 mg | ORAL_TABLET | Freq: Every day | ORAL | 5 refills | Status: AC

## 2024-03-09 MED ORDER — RISPERIDONE 0.5 MG PO TABS: 0.5000 mg | ORAL_TABLET | ORAL | 5 refills | Status: AC

## 2024-03-09 MED ORDER — BUPROPION HCL ER (XL) 150 MG PO TB24: 150.0000 mg | ORAL_TABLET | Freq: Every day | ORAL | 5 refills | Status: AC

## 2024-03-09 MED ORDER — CITALOPRAM HYDROBROMIDE 20 MG PO TABS: 20.0000 mg | ORAL_TABLET | Freq: Every day | ORAL | 5 refills | Status: AC

## 2024-03-09 NOTE — Progress Notes (Signed)
 [facility] MD/PA/NP OP Progress Note  Patient location; office Provider location; office  03/09/2024 3:27 PM Rhonda [name]  MRN:  [id number]  Chief Complaint:  Chief Complaint  Patient presents with   Follow-up   Medication Refill   HPI: Patient came to the office with her husband for the follow-up appointment.  Husband is concerned as patient continued to have decline in the memory.  He reported patient getting very tired with lack of energy and recently had a fall but luckily no major injury.  Now they are 65 year old daughter may have moved in with them to help them.  Patient recently seen cardiologist Dr. Odis Naegeli and had blood work.  Creatinine 2.26 and GFR 19.  Patient has seen in the emergency room for neuropathy and given gabapentin .  Patient was superficially cooperative and most of the information was obtained from her husband.  Husband continued to endorse that she is somewhat tired during the day that she sleeps most of the time.  Home health aide comes 4 hours a week and that helps a lot.  They also received care from cardiology office who manages the medication inbox.  Husband reported patient denies any agitation, anger, crying, feeling of hopelessness or worthlessness.  Husband works full-time job and usually patient is by herself but now daughter is coming to moved in with them to take care of the patient.  As per husband patient is taking Celexa , Wellbutrin , Risperdal  and Klonopin .  Patient has balance issues and she uses cane to help her balance.  She is also seeing Dr. Chrystal at [facility] physician.  She is on Mounjaro .  Last hemoglobin A1c was done in May which was 6.  Patient does not leave the house by herself.  She admitted getting confused and do not remember directions very well.  Visit Diagnosis:    ICD-10-CM   1. Major depressive disorder, recurrent episode, mild  F33.0 citalopram  (CELEXA ) 20 MG tablet    buPROPion  (WELLBUTRIN  XL) 150 MG 24 hr tablet    clonazePAM  (KLONOPIN )  0.5 MG tablet    risperiDONE  (RISPERDAL ) 0.5 MG tablet    2. Generalized anxiety disorder  F41.1 citalopram  (CELEXA ) 20 MG tablet    clonazePAM  (KLONOPIN ) 0.5 MG tablet    3. Cognitive decline  R41.89 buPROPion  (WELLBUTRIN  XL) 150 MG 24 hr tablet         Past Psychiatric History: Reviewed. History of depression, anxiety and seen by Dr. Hipolito but no details. H/O inpatient in March 2024 for psychosis and and aggressive behavior. D/C on trazodone , Seroquel , risperidone , Celexa , Wellbutrin , Klonopin  and Wellbutrin . Given olanzapine , and Haldol  for agitation. No history of suicidal attempt.   Past Medical History:  Past Medical History:  Diagnosis Date   Abnormal gait 06/11/2020   Acute left ankle pain 10/11/2021   AKI (acute kidney injury) 10/08/2021   Allergic rhinitis 06/11/2020   Anemia    Arthritis    Right knee   Asthma 06/11/2020   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   Back pain    Disk problem   Benign intracranial hypertension 06/11/2020   Bilateral leg weakness 08/01/2019   Biventricular ICD (implantable cardioverter-defibrillator) in place 08/04/2013   Body mass index (BMI) 45.0-49.9, adult 06/11/2020   Bowel incontinence 06/11/2020   Cardiomyopathy    Cholelithiasis 06/11/2020   Chronic respiratory failure 09/14/2013   Chronic sinusitis 06/11/2020   Chronic systolic heart failure 10/27/2012   a) NICM b) ECHO (03/2013) EF 20-25% c) ECHO (09/2013) EF 45-50%, grade I DD  CKD (chronic kidney disease) stage 3, GFR 30-59 ml/min 11/08/2018   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   Complication of anesthesia    History of low blood pressure after surgery; attributed to lying flat   Daytime somnolence 06/11/2020   Decreased estrogen level 08/01/2021   Diabetic neuropathy    Diarrhea of presumed infectious origin 06/11/2020   Edema 06/11/2020   Elevated troponin 12/02/2021   Endometrial polyp 01/20/2012   Exertional shortness of breath    Generalized anxiety disorder    GERD  (gastroesophageal reflux disease)    Gout 06/11/2020   Heart murmur    History of colonic polyps 06/11/2020   History of fall 06/11/2020   Hyperlipidemia    Hyperparathyroidism 08/01/2021   Hypertension    Hypokalemia 11/08/2018   Hyponatremia 10/08/2021   Hypothyroidism    Insomnia 06/11/2020   Intractable nausea and vomiting 10/08/2021   Irritable bowel syndrome 01/04/2020   Left bundle branch block    s/p CRT-D (04/2013)   Lumbago without sciatica 04/30/2021   Lumbar spondylosis with myelopathy 06/11/2020   Macrocytosis 06/11/2020   Major depressive disorder    Malignant tumor of breast 03/26/2011   Left; completed chemotherapy and radiation treatments   Migraines    Mild dementia 04/20/2023   On home oxygen  therapy    2L suppose to be q night (05/03/2013)   Orthostatic hypotension 07/28/2017   Spinal stenosis of lumbar region 01/03/2020   Spondylolisthesis 08/01/2021   SVD (spontaneous vaginal delivery)    x 2   Type II diabetes mellitus 06/15/2019   Varicose veins of bilateral lower extremities with other complications 08/01/2021   Vitamin D  deficiency 03/26/2011    Past Surgical History:  Procedure Laterality Date   BI-VENTRICULAR IMPLANTABLE CARDIOVERTER DEFIBRILLATOR N/A 05/03/2013   Procedure: BI-VENTRICULAR IMPLANTABLE CARDIOVERTER DEFIBRILLATOR  (CRT-D);  Surgeon: Danelle LELON Birmingham, MD;  Location: [facility] CATH LAB;  Service: Cardiovascular;  Laterality: N/A;   BI-VENTRICULAR IMPLANTABLE CARDIOVERTER DEFIBRILLATOR  (CRT-D)  05/03/2013   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   BIOPSY  05/31/2018   Procedure: BIOPSY;  Surgeon: Saintclair Jasper, MD;  Location: [facility];  Service: Gastroenterology;;   BIV ICD GENERATOR CHANGEOUT N/A 04/11/2020   Procedure: BIV ICD GENERATOR CHANGEOUT;  Surgeon: Birmingham Danelle LELON, MD;  Location: [facility] INVASIVE CV LAB;  Service: Cardiovascular;  Laterality: N/A;   BREAST LUMPECTOMY Left 07/28/2010   CARDIAC CATHETERIZATION   2010   NORMAL CORONARY ARTERIES   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--11 SURGERIES     PT HAS REMOVABLE SPEECH BULB-TAKES IT OUT BEFORE HER SURGERY   COLONOSCOPY     COLONOSCOPY N/A 05/31/2018   Procedure: COLONOSCOPY;  Surgeon: Saintclair Jasper, MD;  Location: [facility];  Service: Gastroenterology;  Laterality: N/A;   HYSTEROSCOPY WITH D & C  01/20/2012   Procedure: DILATATION AND CURETTAGE /HYSTEROSCOPY;  Surgeon: Charlie CHRISTELLA Croak, MD;  Location: [facility];  Service: Gynecology;  Laterality: N/A;  with trueclear   PORT-A-CATH REMOVAL  09/23/2011   Procedure: REMOVAL PORT-A-CATH;  Surgeon: Donnice Bury, MD;  Location: [facility];  Service: General;  Laterality: N/A;  Port Removal   PORTACATH PLACEMENT  2012   TONSILLECTOMY  1960's   TOTAL KNEE ARTHROPLASTY  05/10/2012   Procedure: TOTAL KNEE ARTHROPLASTY;  Surgeon: Donnice JONETTA Car, MD;  Location: [facility];  Service: Orthopedics;  Laterality: Right;   WISDOM TOOTH EXTRACTION      Family Psychiatric History: Reviewed.  Family History:  Family History  Problem  Relation Age of Onset   Alzheimer's disease Mother    CVA Mother    Hypertension Mother    Cancer - Colon Father        late 52   Birth defects Paternal Uncle     Social History:  Social History   Socioeconomic History   Marital status: Married    Spouse name: Dallas   Number of children: 2   Years of education: 18   Highest education level: Manufacturing engineer (e.g., MA, MS, MEng, MEd, MSW, MBA)  Occupational History   Occupation: retired  Tobacco Use   Smoking status: Every Day    Current packs/day: 0.00    Average packs/day: 0.5 packs/day for 26.0 years (13.0 ttl pk-yrs)    Types: Cigarettes    Start date: 03/24/1995    Last attempt to quit: 03/23/2021    Years since quitting: 2.9   Smokeless tobacco: Never  Vaping Use   Vaping status: Never Used  Substance and Sexual Activity   Alcohol use: No   Drug use: No   Sexual activity: Yes  Other Topics Concern   Not on file   Social History Narrative   Tobacco Use Cigarettes: Former Smoker, Quit in 2008   No Alcohol   No recreational drug use   Diet: Regular/Low Carb   Exercise: None   Occupation: disabled   Education: Company secretary, masters   Children: 2   Firearms: No   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   Two story home   Lives with spouse Dallas      Social Drivers of Health   Financial Resource Strain: Low Risk  (09/17/2023)   Overall Financial Resource Strain (CARDIA)    Difficulty of Paying Living Expenses: Not very hard  Food Insecurity: No Food Insecurity (03/06/2024)   Hunger Vital Sign    Worried About Running Out of Food in the Last Year: Never true    Ran Out of Food in the Last Year: Never true  Transportation Needs: No Transportation Needs (03/06/2024)   PRAPARE - Administrator, Civil Service (Medical): No    Lack of Transportation (Non-Medical): No  Physical Activity: Insufficiently Active (09/17/2023)   Exercise Vital Sign    Days of Exercise per Week: 1 day    Minutes of Exercise per Session: 20 min  Stress: Stress Concern Present (09/17/2023)   [name] of Occupational Health - Occupational Stress Questionnaire    Feeling of Stress : To some extent  Social Connections: Socially Integrated (09/17/2023)   Social Connection and Isolation Panel    Frequency of Communication with Friends and Family: Twice a week    Frequency of Social Gatherings with Friends and Family: Twice a week    Attends Religious Services: 1 to 4 times per year    Active Member of [employer] or Organizations: Patient unable to answer    Attends Banker Meetings: 1 to 4 times per year    Marital Status: Married    Allergies:  Allergies  Allergen Reactions   Ceftin Anaphylaxis    Face and throat swell    Cefuroxime Axetil Anaphylaxis    Face and throat swell   Geodon [Ziprasidone Hcl] Hives   Lisinopril  Other (See Comments)     angioedema  lisinopril   Other Reaction(s): angioedema   Shellfish Allergy Other (See Comments)    Gout exacerbation  shellfish derived   Shellfish Protein-Containing Drug Products Other (See  Comments)    Other reaction(s): Other  shellfish derived   Cefuroxime     Other reaction(s): anaphylaxis  Other Reaction(s): Unknown   Sulfacetamide Sodium-Sulfur      Other reaction(s): itch   Ziprasidone     Other reaction(s): hives on left arm  Other Reaction(s): hives on left arm   Allopurinol  Nausea Only and Other (See Comments)    weakness  Other Reaction(s): nausea, weakness   Diazepam  Other (See Comments)    Patient states that diazepam  doesn't relax, it has the opposite effect.  Valium    Lorazepam  Itching   Sulfa Antibiotics Itching    Metabolic Disorder Labs: Lab Results  Component Value Date   HGBA1C 7.0 (H) 10/09/2021   MPG 154.2 10/09/2021   MPG 151.33 10/08/2021   No results found for: PROLACTIN Lab Results  Component Value Date   CHOL 133 07/31/2022   TRIG 67 07/31/2022   HDL 51 07/31/2022   CHOLHDL 2.6 07/31/2022   VLDL 13 07/31/2022   LDLCALC 69 07/31/2022   LDLCALC  08/05/2009    92        Total Cholesterol/HDL:CHD Risk Coronary Heart Disease Risk Table                     Men   Women  1/2 Average Risk   3.4   3.3  Average Risk       5.0   4.4  2 X Average Risk   9.6   7.1  3 X Average Risk  23.4   11.0        Use the calculated Patient Ratio above and the CHD Risk Table to determine the patient's CHD Risk.        ATP III CLASSIFICATION (LDL):  <100     mg/dL   Optimal  899-870  mg/dL   Near or Above                    Optimal  130-159  mg/dL   Borderline  839-810  mg/dL   High  >809     mg/dL   Very High   Lab Results  Component Value Date   TSH 2.398 07/26/2022   TSH 1.127 10/08/2021    Therapeutic Level Labs: No results found for: LITHIUM No results found for: VALPROATE No results found for: CBMZ  Current  Medications: Current Outpatient Medications  Medication Sig Dispense Refill   acetaminophen  (TYLENOL ) 500 MG tablet Take 500 mg by mouth every 6 (six) hours as needed.     acetaZOLAMIDE  (DIAMOX ) 125 MG tablet Take 1 tablet (125 mg total) by mouth daily. 90 tablet 3   albuterol  (VENTOLIN  HFA) 108 (90 Base) MCG/ACT inhaler INHALE TWO PUFFS BY MOUTH EVERY 4 HOURS AS NEEDED     allopurinol  (ZYLOPRIM ) 100 MG tablet TAKE 1/2 TABLET (50 MG TOTAL) BY MOUTH DAILY. 45 tablet 1   amoxicillin  (AMOXIL ) 875 MG tablet TAKE 1 TABLET BY MOUTH TWICE A DAY 7 DAYS     azelastine  (ASTELIN ) 0.1 % nasal spray Place 2 sprays into both nostrils 2 (two) times daily. Use in each nostril as directed 30 mL 12   buPROPion  (WELLBUTRIN  XL) 150 MG 24 hr tablet Take 1 tablet (150 mg total) by mouth daily. 30 tablet 5   Capsaicin 0.1 % CREA 1 application as needed Externally Three times a day as needed     carvedilol  (COREG ) 3.125 MG tablet TAKE 1 TABLET (3.125 MG) BY MOUTH TWICE DAILY  WITH MEALS 180 tablet 3   citalopram  (CELEXA ) 20 MG tablet Take 1 tablet (20 mg total) by mouth daily. 30 tablet 5   clonazePAM  (KLONOPIN ) 0.5 MG tablet Take 1 tablet (0.5 mg total) by mouth at bedtime. 30 tablet 5   cyanocobalamin  (VITAMIN B12) 1000 MCG tablet Take 1,000 mcg by mouth daily.     desloratadine  (CLARINEX ) 5 MG tablet TAKE 1 TABLET (5 MG TOTAL) BY MOUTH DAILY FOR ALLERGIES 90 tablet 3   famotidine (PEPCID) 20 MG tablet Take 20 mg by mouth as needed for heartburn or indigestion.     ferrous sulfate  325 (65 FE) MG tablet 1 tablet Orally Once a day     fluticasone  (FLONASE ) 50 MCG/ACT nasal spray Place 1 spray into both nostrils daily.     Furosemide  (FUROSCIX ) 80 MG/10ML CTKT Inject 80 mg into the skin daily as needed. As directed by the heart failure clinic 5 each 1   gabapentin  (NEURONTIN ) 100 MG capsule TAKE 1 CAPSULE (100 MG TOTAL) BY MOUTH 2 (TWO) TIMES DAILY. 180 capsule 0   ipratropium (ATROVENT ) 0.03 % nasal spray       levothyroxine  (SYNTHROID ) 88 MCG tablet TAKE 1 TABLET(88 MCG) BY MOUTH DAILY BEFORE BREAKFAST 90 tablet 3   memantine  (NAMENDA ) 5 MG tablet Take 5 mg by mouth 2 (two) times daily.     metolazone  (ZAROXOLYN ) 2.5 MG tablet TAKE 1 TABLET BY MOUTH ONCE A WEEK 4 tablet 1   midodrine  (PROAMATINE ) 5 MG tablet Take 1 tablet (5 mg total) by mouth 3 (three) times daily. 200 tablet 3   mirtazapine  (REMERON ) 30 MG tablet      Multiple Vitamin (MULTIVITAMIN WITH MINERALS) TABS tablet Take 1 tablet by mouth daily.     phenol (CHLORASEPTIC) 1.4 % LIQD Use as directed 1 spray in the mouth or throat as needed for throat irritation / pain.     potassium chloride  SA (KLOR-CON  M) 20 MEQ tablet Take 3 tablets (60 mEq total) by mouth 2 (two) times daily. 572 tablet 3   risperiDONE  (RISPERDAL ) 0.5 MG tablet Take 1 tablet (0.5 mg total) by mouth 2 (two) times daily at 8 am and 4 pm. 60 tablet 5   simvastatin  (ZOCOR ) 10 MG tablet Take 1 tablet (10 mg total) by mouth every evening. 90 tablet 3   spironolactone  (ALDACTONE ) 25 MG tablet TAKE 1 TABLET (25 MG TOTAL) BY MOUTH EVERY EVENING. 90 tablet 3   tirzepatide  (MOUNJARO ) 12.5 MG/0.5ML Pen Inject 12.5 mg into the skin once a week. 2 mL 0   torsemide  (DEMADEX ) 100 MG tablet Take 1 tablet (100 mg total) by mouth 2 (two) times daily. 180 tablet 3   COMIRNATY syringe  (Patient not taking: Reported on 03/08/2024)     dicyclomine  (BENTYL ) 20 MG tablet Take 20 mg by mouth 3 (three) times daily before meals. (Patient not taking: Reported on 03/08/2024)     doxycycline  (VIBRAMYCIN ) 100 MG capsule Take 100 mg by mouth 2 (two) times daily. (Patient not taking: Reported on 03/08/2024)     febuxostat  (ULORIC ) 40 MG tablet 1 tablet Orally Once a day (Patient not taking: Reported on 03/08/2024)     fludrocortisone  (FLORINEF ) 0.1 MG tablet  (Patient not taking: Reported on 03/08/2024)     gabapentin  (NEURONTIN ) 400 MG capsule Take 1 capsule (400 mg total) by mouth daily in the afternoon  for 3 days. Continue the prior prescription of 100 mg twice a day in addition to the 400 mg once daily  in the afternoon for a total of 600 mg daily which is the maximum allowed due to chronic kidney condition (Patient not taking: Reported on 03/08/2024) 3 capsule 0   HYDROcodone -acetaminophen  (NORCO/VICODIN) 5-325 MG tablet Take 1 tablet by mouth every 6 (six) hours as needed for moderate pain (pain score 4-6). (Patient not taking: Reported on 03/08/2024)     hydrOXYzine  (VISTARIL ) 25 MG capsule  (Patient not taking: Reported on 03/08/2024)     lisinopril  (ZESTRIL ) 2.5 MG tablet 1 tablet Orally Once a day (Patient not taking: Reported on 03/08/2024)     methocarbamol  (ROBAXIN ) 500 MG tablet 1.5 tablets Orally every 4 hrs (Patient not taking: Reported on 03/08/2024)     montelukast  (SINGULAIR ) 10 MG tablet  (Patient not taking: Reported on 03/08/2024)     nicotine polacrilex (COMMIT) 2 MG lozenge as directed Mouth/Throat every 4 hours (Patient not taking: Reported on 03/08/2024)     nystatin (MYCOSTATIN) 100000 UNIT/ML suspension 4 mL swish and swallow Mouth/Throat Four times a day; Duration: 14 days (Patient not taking: Reported on 03/08/2024)     ondansetron  (ZOFRAN ) 4 MG tablet TAKE 1 TABLET (4 MG TOTAL) BY MOUTH EVERY 6 (SIX) HOURS. (Patient not taking: Reported on 03/08/2024)     pantoprazole  (PROTONIX ) 40 MG tablet TAKE ONE TABLET BY MOUTH ONCE DAILY FOR ACID REFLUX (Patient not taking: Reported on 03/08/2024)     tiZANidine  (ZANAFLEX ) 2 MG tablet 1 tablet at bedtime as needed Oral Once a day; Duration: 30 days (Patient not taking: Reported on 03/08/2024)     traMADol (ULTRAM) 50 MG tablet  (Patient not taking: Reported on 03/08/2024)     zolpidem  (AMBIEN ) 10 MG tablet 1 tablet at bedtime as needed Orally Once a day (Patient not taking: Reported on 03/08/2024)     No current facility-administered medications for this visit.   Psychiatric Specialty Exam: Physical Exam  Review of Systems   Constitutional:  Positive for fatigue.  Neurological:        Memory issues, balance issue and walk with the help of cane  Psychiatric/Behavioral:  Positive for decreased concentration.     Blood pressure 106/71, pulse 76, height 5' 2.5 (1.588 m), weight 200 lb (90.7 kg).Body mass index is 36 kg/m.  General Appearance: Casual  Eye Contact:  Fair  Speech:  Slow  Volume:  Decreased  Mood:  Euthymic  Affect:  Restricted  Thought Process:  Descriptions of Associations: Intact  Orientation:  Full (Time, Place, and Person)  Thought Content:  Abstract Reasoning  Suicidal Thoughts:  No  Homicidal Thoughts:  No  Memory:  Immediate;   Fair Recent;   Fair Remote;   Fair  Judgement:  Fair  Insight:  Shallow  Psychomotor Activity:  Decreased  Concentration:  Concentration: Fair and Attention Span: Fair  Recall:  [employer] of Knowledge:  Fair  Language:  Fair  Akathisia:  No  Handed:  Right  AIMS (if indicated):     Assets:  Communication Skills Desire for Improvement Housing Social Support  ADL's:  Impaired  Cognition:  Impaired,  Mild  Sleep:   fair     Screenings: AUDIT    Flowsheet Row Admission (Discharged) from 07/27/2022 in [facility] BE[facility]AVIORAL MEDICINE  Alcohol Use Disorder Identification Test Final Score (AUDIT) 0   GAD-7    Flowsheet Row Patient Outreach from 09/17/2023 in [place] POPULATION HEALTH DEPARTMENT  Total GAD-7 Score 4   PHQ2-9    Flowsheet Row Patient Outreach from 09/17/2023 in [place]  [facility]PART[facility] Patient Outreach from 09/03/2023 in [place] POPULATION HEALTH DEPARTMENT Care Coordination from 08/11/2022 in [facility] Community Care Coordination  PHQ-2 Total Score 1 1 1    Flowsheet Row UC from 03/01/2024 in [facility]) ED from 10/25/2023 in [facility] Emergency Department at [facility] UC from 09/03/2022 in [facility])   C-SSRS RISK CATEGORY No Risk No Risk No Risk     Assessment and Plan: Patient is 73 year old African-American married female with history of CHF, hypertension, migraine, memory impairment, hypothyroidism, type 2 diabetes, neuropathy, benign intracranial hypertension, chronic pain, chronic kidney disease, major depressive disorder and generalized anxiety disorder.  Reviewed blood work results and current medication.  Creatinine 2.26 and GFR 19.  She is no longer taking Ambien  and tramadol prescribed by PCP.  Review notes from other provider.  She was seen in the emergency room for foot pain and gabapentin  was given that helped.  She has a primary care at [facility] primary care.  Husband reported so far mood stable and does not get agitated, depressed, irritable.  We had cut down the Klonopin  from twice a day to only once a day.  For now continue Wellbutrin  XL 150 mg in the morning, Celexa  20 mg daily and risperidone  0.5 mg twice a day.  Will consider lowering the dose in the future if needed.  Encouraged to keep appointment with Dr. Marlee for nephrology.  Recommend to call back if she is any question or any concern.  Follow-up in 6 months.    Collaboration of Care: Collaboration of Care: Other provider involved in patient's care AEB notes are available in epic to review.  Patient/Guardian was advised Release of Information must be obtained prior to any record release in order to collaborate their care with an outside provider. Patient/Guardian was advised if they have not already done so to contact the registration department to sign all necessary forms in order for us  to release information regarding their care.   Consent: Patient/Guardian gives verbal consent for treatment and assignment of benefits for services provided during this visit. Patient/Guardian expressed understanding and agreed to proceed.   I provided 32 minutes of face-to-face time to the patient during this encounter  Leni ONEIDA Client,  MD 03/09/2024, 3:27 PM Patient ID: Rhonda [name], female   DOB: Oct 25, 1950, 73 y.o.   MRN: [id number]

## 2024-03-13 ENCOUNTER — Ambulatory Visit (INDEPENDENT_AMBULATORY_CARE_PROVIDER_SITE_OTHER): Payer: Medicare HMO

## 2024-03-13 DIAGNOSIS — I428 Other cardiomyopathies: Secondary | ICD-10-CM | POA: Diagnosis not present

## 2024-03-14 LAB — CUP PACEART REMOTE DEVICE CHECK
Battery Remaining Longevity: 32 mo
Battery Voltage: 2.95 V
Brady Statistic AP VP Percent: 0.35 %
Brady Statistic AP VS Percent: 0.02 %
Brady Statistic AS VP Percent: 99.62 %
Brady Statistic AS VS Percent: 0.01 %
Brady Statistic RA Percent Paced: 0.37 %
Brady Statistic RV Percent Paced: 99.94 %
Date Time Interrogation Session: 20251020043827
HighPow Impedance: 91 Ohm
Implantable Lead Connection Status: 753985
Implantable Lead Connection Status: 753985
Implantable Lead Connection Status: 753985
Implantable Lead Implant Date: 20141210
Implantable Lead Implant Date: 20141210
Implantable Lead Implant Date: 20141210
Implantable Lead Location: 753858
Implantable Lead Location: 753859
Implantable Lead Location: 753860
Implantable Lead Model: 4396
Implantable Lead Model: 5076
Implantable Lead Model: 6935
Implantable Pulse Generator Implant Date: 20211118
Lead Channel Impedance Value: 399 Ohm
Lead Channel Impedance Value: 456 Ohm
Lead Channel Impedance Value: 475 Ohm
Lead Channel Impedance Value: 589 Ohm
Lead Channel Impedance Value: 665 Ohm
Lead Channel Impedance Value: 760 Ohm
Lead Channel Pacing Threshold Amplitude: 0.625 V
Lead Channel Pacing Threshold Amplitude: 0.75 V
Lead Channel Pacing Threshold Amplitude: 1.125 V
Lead Channel Pacing Threshold Pulse Width: 0.4 ms
Lead Channel Pacing Threshold Pulse Width: 0.4 ms
Lead Channel Pacing Threshold Pulse Width: 0.4 ms
Lead Channel Sensing Intrinsic Amplitude: 2.375 mV
Lead Channel Sensing Intrinsic Amplitude: 2.375 mV
Lead Channel Sensing Intrinsic Amplitude: 27.25 mV
Lead Channel Sensing Intrinsic Amplitude: 27.25 mV
Lead Channel Setting Pacing Amplitude: 1.25 V
Lead Channel Setting Pacing Amplitude: 2 V
Lead Channel Setting Pacing Amplitude: 2.25 V
Lead Channel Setting Pacing Pulse Width: 0.4 ms
Lead Channel Setting Pacing Pulse Width: 0.4 ms
Lead Channel Setting Sensing Sensitivity: 0.3 mV
Zone Setting Status: 755011
Zone Setting Status: 755011

## 2024-03-15 ENCOUNTER — Ambulatory Visit: Payer: Self-pay | Admitting: Internal Medicine

## 2024-03-15 ENCOUNTER — Other Ambulatory Visit: Payer: Self-pay | Admitting: Licensed Clinical Social Worker

## 2024-03-15 ENCOUNTER — Other Ambulatory Visit (HOSPITAL_COMMUNITY): Payer: Self-pay

## 2024-03-15 ENCOUNTER — Encounter

## 2024-03-15 NOTE — Progress Notes (Signed)
 Paramedicine Encounter    Patient ID: Rhonda [name], female    DOB: 15-Oct-1950, 73 y.o.   MRN: [id number]   Complaints- CAOX4 warm and dry ambulating in her room reporting to be having a headache. She took Tylenol  for same and plans to rest after our visit.   Assessment- CAOX4, warm and dry, no lower leg swelling, weight is 200lbs 3 lbs up from last week. Lungs clear, vitals stable.   Compliance with meds- no missed doses of pills, missed two weeks of monjaro   Pill box filled- for one week   Refills needed- risperidone , memantine , clonzepam, wellbutrin    Meds changes since last visit- none     Social changes- none    VISIT SUMMARY- Arrived for home visit for Rhonda [name] who reports to be having a headache. She denied any dizziness, chest pain or shortness of breath. No swelling noted. Weight up 3 lbs from last week- she has been without monjauro for two weeks. I called pharm and they are waiting on RX refill from MD, I will send a message. I obtained vitals and assessment. Meds reviewed. Pill box filled for one week. I plan to see her in one week. HF education given to husband and patient. Appointments reviewed. Visit complete.   BP 110/68   Pulse 92   Resp 16   Wt 200 lb (90.7 kg)   SpO2 98%   BMI 36.00 kg/m  Weight yesterday-- didn't weigh  Last visit weight-- 197lbs      ACTION: Home visit completed     Patient Care Team: Chrystal Lamarr GORMAN, MD as PCP - General (Family Medicine) Waddell Danelle ORN, MD as PCP - Electrophysiology (Cardiology) Cathern Andriette DEL, LCSW as Social Worker (Licensed Clinical Social Worker) Tobie, Tonita POUR, DO as Consulting Physician (Neurology) Jacques Moats, Paramedic as Paramedic Chrystal, Lamarr GORMAN, MD as Attending Physician (Family Medicine) Bensimhon, Toribio SAUNDERS, MD as Consulting Physician (Cardiology) Curry, Leni DASEN, MD as Consulting Physician (Psychiatry) Tobie Franky SQUIBB, DPM as Consulting Physician (Podiatry) Aniceto Daphne CROME, NP as  Nurse Practitioner (Cardiology) Gaynel Delon CROME, DPM as Consulting Physician (Podiatry) Slusher, Santana LABOR, RN as Registered Nurse  Patient Active Problem List   Diagnosis Date Noted   Mild dementia 04/20/2023   Diabetic neuropathy    Elevated troponin 12/02/2021   Acute left ankle pain 10/11/2021   Hyponatremia 10/08/2021   Decreased estrogen level 08/01/2021   Hyperparathyroidism 08/01/2021   Spondylolisthesis 08/01/2021   Varicose veins of bilateral lower extremities with other complications 08/01/2021   Lumbago without sciatica 04/30/2021   Abnormal gait 06/11/2020   Allergic rhinitis 06/11/2020   Asthma 06/11/2020   Benign intracranial hypertension 06/11/2020   Body mass index (BMI) 45.0-49.9, adult 06/11/2020   Bowel incontinence 06/11/2020   Cholelithiasis 06/11/2020   Chronic sinusitis 06/11/2020   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   Daytime somnolence 06/11/2020   Edema 06/11/2020   Family history of malignant neoplasm of gastrointestinal tract 06/11/2020   Gout 06/11/2020   Insomnia 06/11/2020   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   Lumbar spondylosis with myelopathy 06/11/2020   Macrocytosis 06/11/2020   History of colonic polyps 06/11/2020   Repeated falls 06/11/2020   Irritable bowel syndrome 01/04/2020   Spinal stenosis of lumbar region 01/03/2020   Bilateral leg weakness 08/01/2019   Type II diabetes mellitus 06/15/2019   Hypokalemia 11/08/2018   CKD (chronic kidney disease) stage 3, GFR 30-59 ml/min 11/08/2018   Orthostatic hypotension 07/28/2017   On home oxygen  therapy    Migraines  Left bundle branch block    Hypothyroidism    Hypertension    Hyperlipidemia    Heart murmur    GERD (gastroesophageal reflux disease)    Exertional shortness of breath    Major depressive disorder    Back pain    Arthritis    Generalized anxiety disorder    Anemia    Chronic respiratory failure 09/14/2013   Biventricular ICD (implantable cardioverter-defibrillator) in place  08/04/2013   Chronic systolic heart failure 10/27/2012   Endometrial polyp 01/20/2012   Malignant tumor of breast 03/26/2011   Vitamin D  deficiency 03/26/2011    Current Outpatient Medications:    acetaminophen  (TYLENOL ) 500 MG tablet, Take 500 mg by mouth every 6 (six) hours as needed., Disp: , Rfl:    acetaZOLAMIDE  (DIAMOX ) 125 MG tablet, Take 1 tablet (125 mg total) by mouth daily., Disp: 90 tablet, Rfl: 3   albuterol  (VENTOLIN  HFA) 108 (90 Base) MCG/ACT inhaler, INHALE TWO PUFFS BY MOUTH EVERY 4 HOURS AS NEEDED, Disp: , Rfl:    allopurinol  (ZYLOPRIM ) 100 MG tablet, TAKE 1/2 TABLET (50 MG TOTAL) BY MOUTH DAILY., Disp: 45 tablet, Rfl: 1   azelastine  (ASTELIN ) 0.1 % nasal spray, Place 2 sprays into both nostrils 2 (two) times daily. Use in each nostril as directed, Disp: 30 mL, Rfl: 12   buPROPion  (WELLBUTRIN  XL) 150 MG 24 hr tablet, Take 1 tablet (150 mg total) by mouth daily., Disp: 30 tablet, Rfl: 5   Capsaicin 0.1 % CREA, 1 application as needed Externally Three times a day as needed, Disp: , Rfl:    carvedilol  (COREG ) 3.125 MG tablet, TAKE 1 TABLET (3.125 MG) BY MOUTH TWICE DAILY WITH MEALS, Disp: 180 tablet, Rfl: 3   citalopram  (CELEXA ) 20 MG tablet, Take 1 tablet (20 mg total) by mouth daily., Disp: 30 tablet, Rfl: 5   clonazePAM  (KLONOPIN ) 0.5 MG tablet, Take 1 tablet (0.5 mg total) by mouth at bedtime., Disp: 30 tablet, Rfl: 5   cyanocobalamin  (VITAMIN B12) 1000 MCG tablet, Take 1,000 mcg by mouth daily., Disp: , Rfl:    desloratadine  (CLARINEX ) 5 MG tablet, TAKE 1 TABLET (5 MG TOTAL) BY MOUTH DAILY FOR ALLERGIES, Disp: 90 tablet, Rfl: 3   famotidine (PEPCID) 20 MG tablet, Take 20 mg by mouth as needed for heartburn or indigestion., Disp: , Rfl:    ferrous sulfate  325 (65 FE) MG tablet, 1 tablet Orally Once a day, Disp: , Rfl:    fluticasone  (FLONASE ) 50 MCG/ACT nasal spray, Place 1 spray into both nostrils daily., Disp: , Rfl:    Furosemide  (FUROSCIX ) 80 MG/10ML CTKT, Inject 80 mg  into the skin daily as needed. As directed by the heart failure clinic, Disp: 5 each, Rfl: 1   gabapentin  (NEURONTIN ) 100 MG capsule, TAKE 1 CAPSULE (100 MG TOTAL) BY MOUTH 2 (TWO) TIMES DAILY., Disp: 180 capsule, Rfl: 0   ipratropium (ATROVENT ) 0.03 % nasal spray, , Disp: , Rfl:    levothyroxine  (SYNTHROID ) 88 MCG tablet, TAKE 1 TABLET(88 MCG) BY MOUTH DAILY BEFORE BREAKFAST, Disp: 90 tablet, Rfl: 3   memantine  (NAMENDA ) 5 MG tablet, Take 5 mg by mouth 2 (two) times daily. (Patient taking differently: Take 10 mg by mouth 2 (two) times daily.), Disp: , Rfl:    metolazone  (ZAROXOLYN ) 2.5 MG tablet, TAKE 1 TABLET BY MOUTH ONCE A WEEK, Disp: 4 tablet, Rfl: 1   midodrine  (PROAMATINE ) 5 MG tablet, Take 1 tablet (5 mg total) by mouth 3 (three) times daily., Disp: 200 tablet, Rfl:  3   Multiple Vitamin (MULTIVITAMIN WITH MINERALS) TABS tablet, Take 1 tablet by mouth daily., Disp: , Rfl:    phenol (CHLORASEPTIC) 1.4 % LIQD, Use as directed 1 spray in the mouth or throat as needed for throat irritation / pain., Disp: , Rfl:    potassium chloride  SA (KLOR-CON  M) 20 MEQ tablet, Take 3 tablets (60 mEq total) by mouth 2 (two) times daily., Disp: 572 tablet, Rfl: 3   risperiDONE  (RISPERDAL ) 0.5 MG tablet, Take 1 tablet (0.5 mg total) by mouth 2 (two) times daily at 8 am and 4 pm., Disp: 60 tablet, Rfl: 5   simvastatin  (ZOCOR ) 10 MG tablet, Take 1 tablet (10 mg total) by mouth every evening., Disp: 90 tablet, Rfl: 3   spironolactone  (ALDACTONE ) 25 MG tablet, TAKE 1 TABLET (25 MG TOTAL) BY MOUTH EVERY EVENING., Disp: 90 tablet, Rfl: 3   tirzepatide  (MOUNJARO ) 12.5 MG/0.5ML Pen, Inject 12.5 mg into the skin once a week., Disp: 2 mL, Rfl: 0   torsemide  (DEMADEX ) 100 MG tablet, Take 1 tablet (100 mg total) by mouth 2 (two) times daily., Disp: 180 tablet, Rfl: 3   amoxicillin  (AMOXIL ) 875 MG tablet, TAKE 1 TABLET BY MOUTH TWICE A DAY 7 DAYS (Patient not taking: Reported on 03/15/2024), Disp: , Rfl:    COMIRNATY syringe,  , Disp: , Rfl:    gabapentin  (NEURONTIN ) 400 MG capsule, Take 1 capsule (400 mg total) by mouth daily in the afternoon for 3 days. Continue the prior prescription of 100 mg twice a day in addition to the 400 mg once daily in the afternoon for a total of 600 mg daily which is the maximum allowed due to chronic kidney condition (Patient not taking: Reported on 03/08/2024), Disp: 3 capsule, Rfl: 0   nicotine polacrilex (COMMIT) 2 MG lozenge, as directed Mouth/Throat every 4 hours (Patient not taking: Reported on 03/15/2024), Disp: , Rfl:    nystatin (MYCOSTATIN) 100000 UNIT/ML suspension, 4 mL swish and swallow Mouth/Throat Four times a day; Duration: 14 days (Patient not taking: Reported on 03/15/2024), Disp: , Rfl:  Allergies  Allergen Reactions   Ceftin Anaphylaxis    Face and throat swell    Cefuroxime Axetil Anaphylaxis    Face and throat swell   Geodon [Ziprasidone Hcl] Hives   Lisinopril  Other (See Comments)    angioedema  lisinopril   Other Reaction(s): angioedema   Shellfish Allergy Other (See Comments)    Gout exacerbation  shellfish derived   Shellfish Protein-Containing Drug Products Other (See Comments)    Other reaction(s): Other  shellfish derived   Cefuroxime     Other reaction(s): anaphylaxis  Other Reaction(s): Unknown   Sulfacetamide Sodium-Sulfur      Other reaction(s): itch   Ziprasidone     Other reaction(s): hives on left arm  Other Reaction(s): hives on left arm   Allopurinol  Nausea Only and Other (See Comments)    weakness  Other Reaction(s): nausea, weakness   Diazepam  Other (See Comments)    Patient states that diazepam  doesn't relax, it has the opposite effect.  Valium    Lorazepam  Itching   Sulfa Antibiotics Itching     Social History   Socioeconomic History   Marital status: Married    Spouse name: Dallas   Number of children: 2   Years of education: 18   Highest education level: Master's degree (e.g., MA, MS, MEng, MEd, MSW, MBA)   Occupational History   Occupation: retired  Tobacco Use   Smoking status: Every Day  Current packs/day: 0.00    Average packs/day: 0.5 packs/day for 26.0 years (13.0 ttl pk-yrs)    Types: Cigarettes    Start date: 03/24/1995    Last attempt to quit: 03/23/2021    Years since quitting: 2.9   Smokeless tobacco: Never  Vaping Use   Vaping status: Never Used  Substance and Sexual Activity   Alcohol use: No   Drug use: No   Sexual activity: Yes  Other Topics Concern   Not on file  Social History Narrative   Tobacco Use Cigarettes: Former Smoker, Quit in 2008   No Alcohol   No recreational drug use   Diet: Regular/Low Carb   Exercise: None   Occupation: disabled   Education: Company secretary, masters   Children: 2   Firearms: No   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   Two story home   Lives with spouse Dallas      Social Drivers of Health   Financial Resource Strain: Low Risk  (09/17/2023)   Overall Financial Resource Strain (CARDIA)    Difficulty of Paying Living Expenses: Not very hard  Food Insecurity: No Food Insecurity (03/06/2024)   Hunger Vital Sign    Worried About Running Out of Food in the Last Year: Never true    Ran Out of Food in the Last Year: Never true  Transportation Needs: No Transportation Needs (03/06/2024)   PRAPARE - Administrator, Civil Service (Medical): No    Lack of Transportation (Non-Medical): No  Physical Activity: Insufficiently Active (09/17/2023)   Exercise Vital Sign    Days of Exercise per Week: 1 day    Minutes of Exercise per Session: 20 min  Stress: Stress Concern Present (09/17/2023)   [name] of Occupational Health - Occupational Stress Questionnaire    Feeling of Stress : To some extent  Social Connections: Socially Integrated (09/17/2023)   Social Connection and Isolation Panel    Frequency of Communication with Friends and Family: Twice a week    Frequency of Social Gatherings with  Friends and Family: Twice a week    Attends Religious Services: 1 to 4 times per year    Active Member of Clubs or Organizations: Patient unable to answer    Attends Banker Meetings: 1 to 4 times per year    Marital Status: Married  Catering manager Violence: Not At Risk (03/06/2024)   Humiliation, Afraid, Rape, and Kick questionnaire    Fear of Current or Ex-Partner: No    Emotionally Abused: No    Physically Abused: No    Sexually Abused: No    Physical Exam      Future Appointments  Date Time Provider Department Center  03/16/2024  2:45 PM [facility] LAB [facility] None  03/23/2024  9:00 AM Slusher, Santana LABOR, RN [facility] None  03/29/2024  7:05 AM [facility] DEVICE REMOTES [facility] H&V  04/26/2024  8:50 AM Tobie Breslow K, DO [facility] None  04/27/2024  1:45 PM Gaynel Delon CROME, DPM [facility]  05/03/2024  3:00 PM [facility] PA/NP [facility] None  06/12/2024  7:10 AM CVD HVT DEVICE REMOTES [facility] H&V  09/07/2024  3:20 PM Arfeen, Leni DASEN, MD [facility] None  09/11/2024  7:10 AM CVD HVT DEVICE REMOTES [facility] H&V  12/11/2024  7:10 AM CVD HVT DEVICE REMOTES [facility] H&V  03/12/2025  7:10 AM CVD HVT DEVICE REMOTES [facility] H&V

## 2024-03-15 NOTE — Patient Outreach (Signed)
 Complex Care Management   Visit Note  03/15/2024  Name:  Rhonda [name] MRN: [id number] DOB: [birth year]  Situation: Referral received for Complex Care Management related to Increased HHA hours I obtained verbal consent from Caregiver.  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   Body mass index (BMI) 45.0-49.9, adult 06/11/2020   Bowel incontinence 06/11/2020   Cardiomyopathy    Cholelithiasis 06/11/2020   Chronic respiratory failure 09/14/2013   Chronic sinusitis 06/11/2020   Chronic systolic heart failure 10/27/2012   a) NICM b) ECHO (03/2013) EF 20-25% c) ECHO (09/2013) EF 45-50%, grade I DD   CKD (chronic kidney disease) stage 3, GFR 30-59 ml/min 11/08/2018   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   Complication of anesthesia    History of low blood pressure after surgery; attributed to lying flat   Daytime somnolence 06/11/2020   Decreased estrogen level 08/01/2021   Diabetic neuropathy    Diarrhea of presumed infectious origin 06/11/2020   Edema 06/11/2020   Elevated troponin 12/02/2021   Endometrial polyp 01/20/2012   Exertional shortness of breath    Generalized anxiety disorder    GERD (gastroesophageal reflux disease)    Gout 06/11/2020   Heart murmur    History of colonic polyps 06/11/2020   History of fall 06/11/2020   Hyperlipidemia    Hyperparathyroidism 08/01/2021   Hypertension    Hypokalemia 11/08/2018   Hyponatremia 10/08/2021   Hypothyroidism    Insomnia 06/11/2020   Intractable nausea and  vomiting 10/08/2021   Irritable bowel syndrome 01/04/2020   Left bundle branch block    s/p CRT-D (04/2013)   Lumbago without sciatica 04/30/2021   Lumbar spondylosis with myelopathy 06/11/2020   Macrocytosis 06/11/2020   Major depressive disorder    Malignant tumor of breast 03/26/2011   Left; completed chemotherapy and radiation treatments   Migraines    Mild dementia 04/20/2023   On home oxygen  therapy    2L suppose to be q night (05/03/2013)   Orthostatic hypotension 07/28/2017   Spinal stenosis of lumbar region 01/03/2020   Spondylolisthesis 08/01/2021   SVD (spontaneous vaginal delivery)    x 2   Type II diabetes mellitus 06/15/2019   Varicose veins of bilateral lower extremities with other complications 08/01/2021   Vitamin D  deficiency 03/26/2011    Assessment: Patients Spouse was on the call today and stated that the HHA hours were increased to the double of what the patient was getting. The Sw also spoke to the DSS CM Ms. Rhonda [name] and confirmed hat the hours were increased. The patient will have a follow up with the [facility] on 03/23/2024 at 9:00. No other SDOH needs at this time.    SDOH Interventions    Flowsheet Row Patient Outreach Telephone from 03/02/2024 in [place] POPULATION HEALTH DEPARTMENT Patient Outreach Telephone from 10/29/2023 in [place] POPULATION HEALTH DEPARTMENT Patient Outreach from 09/17/2023 in [place] POPULATION HEALTH DEPARTMENT Care Coordination from 09/02/2023 in [place] POPULATION HEALTH DEPARTMENT Care Coordination from 03/31/2023 in [facility] Care Coordination from 08/11/2022 in [facility] Care Coordination  SDOH Interventions        Food Insecurity Interventions Intervention Not Indicated Intervention Not Indicated Intervention Not Indicated Intervention Not Indicated Intervention Not Indicated Intervention Not Indicated  Housing Interventions Intervention Not Indicated  Intervention Not Indicated Intervention Not Indicated -- Intervention Not Indicated Intervention Not Indicated  Transportation Interventions Intervention Not Indicated Intervention Not Indicated Intervention Not Indicated Intervention Not Indicated Intervention Not Indicated Intervention Not Indicated  Utilities Interventions Intervention Not Indicated Intervention Not Indicated Intervention Not Indicated Intervention Not Indicated -- Intervention Not Indicated  Alcohol Usage Interventions -- -- Intervention Not Indicated (Score <7) -- -- --  Financial Strain Interventions -- -- Intervention Not Indicated -- -- --  Physical Activity Interventions -- -- Intervention Not Indicated -- -- --  Stress Interventions -- -- Other (Comment)  [active with [facility] SW Rhonda [name] DSS, [name]] -- -- Intervention Not Indicated  Social Connections Interventions -- -- Intervention Not Indicated -- -- --  Health Literacy Interventions -- -- Other (Comment), Intervention Not Indicated  [spouse assists as patient with memory issues] -- -- --      Recommendation:   To call his CM at DSS to ask about an replacement EBT card  Follow Up Plan:   Closing From:  Complex Care Management  Rhonda CHARM Rhonda [name], PhD [facility], [facility] Social Worker Direct Dial: [phone]  Fax: [phone]

## 2024-03-15 NOTE — Patient Instructions (Signed)

## 2024-03-16 ENCOUNTER — Ambulatory Visit (HOSPITAL_COMMUNITY)
Admission: RE | Admit: 2024-03-16 | Discharge: 2024-03-16 | Disposition: A | Discharge status: Home / Self Care | Source: Ambulatory Visit | Attending: Cardiology | Admitting: Cardiology

## 2024-03-16 DIAGNOSIS — I5022 Chronic systolic (congestive) heart failure: Secondary | ICD-10-CM | POA: Insufficient documentation

## 2024-03-16 LAB — BASIC METABOLIC PANEL WITH GFR
Anion gap: 15 (ref 5–15)
BUN: 49 mg/dL — ABNORMAL HIGH (ref 8–23)
CO2: 25 mmol/L (ref 22–32)
Calcium: 8.9 mg/dL (ref 8.9–10.3)
Chloride: 94 mmol/L — ABNORMAL LOW (ref 98–111)
Creatinine, Ser: 2.26 mg/dL — ABNORMAL HIGH (ref 0.44–1.00)
GFR, Estimated: 23 mL/min — ABNORMAL LOW (ref >60)
Glucose, Bld: 75 mg/dL (ref 70–99)
Potassium: 3.5 mmol/L (ref 3.5–5.1)
Sodium: 134 mmol/L — ABNORMAL LOW (ref 135–145)

## 2024-03-17 NOTE — Progress Notes (Signed)
 Remote ICD Transmission

## 2024-03-22 ENCOUNTER — Telehealth (HOSPITAL_COMMUNITY): Payer: Self-pay

## 2024-03-22 ENCOUNTER — Other Ambulatory Visit (HOSPITAL_COMMUNITY): Payer: Self-pay

## 2024-03-22 NOTE — Telephone Encounter (Signed)
 Received call from [name], [name].  She reported patients weight gain and requested review of transmission sent today.   Optivol Thoracic impedance suggesting possible fluid accumulation starting 03/15/2024.

## 2024-03-22 NOTE — Telephone Encounter (Signed)
 HF Paramedicine Message to [facility]  Pharmacy (if applicable): [facility]    Issue/reason for call: 5 lbs weight gain in one week     Blood pressure?  112/62  Weight gain? (How much over a week?) 5lbs    Edema ? (Baseline or worse?) baseline   Dyspnea?  (Baseline or worse?)  slightly worse    Medications are taken as prescribed? Missed two nights    Spoke to [name] and she reports device is showing fluid accumulation since 10/22.  Seeking advice on any med changes.   [name], EMT-Paramedic [phone] 03/22/2024

## 2024-03-22 NOTE — Telephone Encounter (Signed)
 Noted- will ensure she gets same.   [name], EMT-Paramedic [phone] 03/22/2024

## 2024-03-22 NOTE — Progress Notes (Signed)
 Paramedicine Encounter    Patient ID: Rhonda [name], female    DOB: Aug 31, 1950, 73 y.o.   MRN: [id number]   Complaints- headache, weight gain (5 lbs in a week) some shortness of breath on exertion   Assessment- CAOX4, warm and dry, weight up 5 lbs from last week,   Compliance with meds- missed two night doses   Pill box filled- one week   Refills needed- carvedilol , namenda    Meds changes since last visit- today per clinic metolazone  today with extra 40 of K due to fluid     Social changes- Daughter lauraine moving into the home this week.    VISIT SUMMARY- Arrived for home visit for Rhonda [name] who reports to be feeling okay but having a headache. She endorses a 5 lbs weight gain in a week, some shortness of breath on exertion, no lower leg swelling noted. I sent in a transmission for her Optivol and [name] RN noted she did have some fluid build up. HF clinic reviewed and added an extra metolazone  for today with an extra 40 MEQ of K. (Patient took same around 3:00.) I will follow up tomorrow on any weight changes. I reviewed all other meds and filled for one week. Appointments reviewed and confirmed. Home visit complete. I will see her in one week.     BP 112/62   Pulse 82   Resp 16   Wt 205 lb (93 kg)   SpO2 98%   BMI 36.90 kg/m  Weight yesterday-- didn't weigh  Last visit weight-- 200lbs      ACTION: Home visit completed  [name], EMT-Paramedic [phone] 03/22/2024      Patient Care Team: Chrystal Lamarr GORMAN, MD as PCP - General (Family Medicine) Waddell Danelle ORN, MD as PCP - Electrophysiology (Cardiology) Cathern Andriette DEL, LCSW as Social Worker (Licensed Clinical Social Worker) Tobie, Tonita POUR, DO as Consulting Physician (Neurology) Mirza Powell, Paramedic as Paramedic Chrystal, Lamarr GORMAN, MD as Attending Physician (Family Medicine) Bensimhon, Toribio SAUNDERS, MD as Consulting Physician (Cardiology) Curry, Leni DASEN, MD as Consulting Physician  (Psychiatry) Tobie Franky SQUIBB, DPM as Consulting Physician (Podiatry) Aniceto Daphne CROME, NP as Nurse Practitioner (Cardiology) Gaynel Delon CROME, DPM as Consulting Physician (Podiatry) Slusher, Santana LABOR, RN as Registered Nurse  Patient Active Problem List   Diagnosis Date Noted   Mild dementia 04/20/2023   Diabetic neuropathy    Elevated troponin 12/02/2021   Acute left ankle pain 10/11/2021   Hyponatremia 10/08/2021   Decreased estrogen level 08/01/2021   Hyperparathyroidism 08/01/2021   Spondylolisthesis 08/01/2021   Varicose veins of bilateral lower extremities with other complications 08/01/2021   Lumbago without sciatica 04/30/2021   Abnormal gait 06/11/2020   Allergic rhinitis 06/11/2020   Asthma 06/11/2020   Benign intracranial hypertension 06/11/2020   Body mass index (BMI) 45.0-49.9, adult 06/11/2020   Bowel incontinence 06/11/2020   Cholelithiasis 06/11/2020   Chronic sinusitis 06/11/2020   Cleft palate 06/11/2020   Daytime somnolence 06/11/2020   Edema 06/11/2020   Family history of malignant neoplasm of gastrointestinal tract 06/11/2020   Gout 06/11/2020   Insomnia 06/11/2020   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   Lumbar spondylosis with myelopathy 06/11/2020   Macrocytosis 06/11/2020   History of colonic polyps 06/11/2020   Repeated falls 06/11/2020   Irritable bowel syndrome 01/04/2020   Spinal stenosis of lumbar region 01/03/2020   Bilateral leg weakness 08/01/2019   Type II diabetes mellitus 06/15/2019   Hypokalemia 11/08/2018   CKD (chronic kidney disease) stage 3,  GFR 30-59 ml/min 11/08/2018   Orthostatic hypotension 07/28/2017   On home oxygen  therapy    Migraines    Left bundle branch block    Hypothyroidism    Hypertension    Hyperlipidemia    Heart murmur    GERD (gastroesophageal reflux disease)    Exertional shortness of breath    Major depressive disorder    Back pain    Arthritis    Generalized anxiety disorder    Anemia    Chronic  respiratory failure 09/14/2013   Biventricular ICD (implantable cardioverter-defibrillator) in place 08/04/2013   Chronic systolic heart failure 10/27/2012   Endometrial polyp 01/20/2012   Malignant tumor of breast 03/26/2011   Vitamin D  deficiency 03/26/2011    Current Outpatient Medications:    acetaminophen  (TYLENOL ) 500 MG tablet, Take 500 mg by mouth every 6 (six) hours as needed., Disp: , Rfl:    acetaZOLAMIDE  (DIAMOX ) 125 MG tablet, Take 1 tablet (125 mg total) by mouth daily., Disp: 90 tablet, Rfl: 3   albuterol  (VENTOLIN  HFA) 108 (90 Base) MCG/ACT inhaler, INHALE TWO PUFFS BY MOUTH EVERY 4 HOURS AS NEEDED, Disp: , Rfl:    allopurinol  (ZYLOPRIM ) 100 MG tablet, TAKE 1/2 TABLET (50 MG TOTAL) BY MOUTH DAILY., Disp: 45 tablet, Rfl: 1   azelastine  (ASTELIN ) 0.1 % nasal spray, Place 2 sprays into both nostrils 2 (two) times daily. Use in each nostril as directed, Disp: 30 mL, Rfl: 12   buPROPion  (WELLBUTRIN  XL) 150 MG 24 hr tablet, Take 1 tablet (150 mg total) by mouth daily., Disp: 30 tablet, Rfl: 5   Capsaicin 0.1 % CREA, 1 application as needed Externally Three times a day as needed, Disp: , Rfl:    carvedilol  (COREG ) 3.125 MG tablet, TAKE 1 TABLET (3.125 MG) BY MOUTH TWICE DAILY WITH MEALS, Disp: 180 tablet, Rfl: 3   citalopram  (CELEXA ) 20 MG tablet, Take 1 tablet (20 mg total) by mouth daily., Disp: 30 tablet, Rfl: 5   clonazePAM  (KLONOPIN ) 0.5 MG tablet, Take 1 tablet (0.5 mg total) by mouth at bedtime., Disp: 30 tablet, Rfl: 5   cyanocobalamin  (VITAMIN B12) 1000 MCG tablet, Take 1,000 mcg by mouth daily., Disp: , Rfl:    desloratadine  (CLARINEX ) 5 MG tablet, TAKE 1 TABLET (5 MG TOTAL) BY MOUTH DAILY FOR ALLERGIES, Disp: 90 tablet, Rfl: 3   famotidine (PEPCID) 20 MG tablet, Take 20 mg by mouth as needed for heartburn or indigestion., Disp: , Rfl:    ferrous sulfate  325 (65 FE) MG tablet, 1 tablet Orally Once a day, Disp: , Rfl:    fluticasone  (FLONASE ) 50 MCG/ACT nasal spray, Place 1  spray into both nostrils daily., Disp: , Rfl:    Furosemide  (FUROSCIX ) 80 MG/10ML CTKT, Inject 80 mg into the skin daily as needed. As directed by the heart failure clinic, Disp: 5 each, Rfl: 1   gabapentin  (NEURONTIN ) 100 MG capsule, TAKE 1 CAPSULE (100 MG TOTAL) BY MOUTH 2 (TWO) TIMES DAILY., Disp: 180 capsule, Rfl: 0   ipratropium (ATROVENT ) 0.03 % nasal spray, , Disp: , Rfl:    levothyroxine  (SYNTHROID ) 88 MCG tablet, TAKE 1 TABLET(88 MCG) BY MOUTH DAILY BEFORE BREAKFAST, Disp: 90 tablet, Rfl: 3   metolazone  (ZAROXOLYN ) 2.5 MG tablet, TAKE 1 TABLET BY MOUTH ONCE A WEEK, Disp: 4 tablet, Rfl: 1   midodrine  (PROAMATINE ) 5 MG tablet, Take 1 tablet (5 mg total) by mouth 3 (three) times daily., Disp: 200 tablet, Rfl: 3   Multiple Vitamin (MULTIVITAMIN WITH MINERALS) TABS tablet,  Take 1 tablet by mouth daily., Disp: , Rfl:    phenol (CHLORASEPTIC) 1.4 % LIQD, Use as directed 1 spray in the mouth or throat as needed for throat irritation / pain., Disp: , Rfl:    potassium chloride  SA (KLOR-CON  M) 20 MEQ tablet, Take 3 tablets (60 mEq total) by mouth 2 (two) times daily., Disp: 572 tablet, Rfl: 3   risperiDONE  (RISPERDAL ) 0.5 MG tablet, Take 1 tablet (0.5 mg total) by mouth 2 (two) times daily at 8 am and 4 pm., Disp: 60 tablet, Rfl: 5   simvastatin  (ZOCOR ) 10 MG tablet, Take 1 tablet (10 mg total) by mouth every evening., Disp: 90 tablet, Rfl: 3   spironolactone  (ALDACTONE ) 25 MG tablet, TAKE 1 TABLET (25 MG TOTAL) BY MOUTH EVERY EVENING., Disp: 90 tablet, Rfl: 3   tirzepatide  (MOUNJARO ) 12.5 MG/0.5ML Pen, INJECT 12.5 MG INTO THE SKIN ONCE A WEEK., Disp: 2 mL, Rfl: 3   torsemide  (DEMADEX ) 100 MG tablet, Take 1 tablet (100 mg total) by mouth 2 (two) times daily., Disp: 180 tablet, Rfl: 3   amoxicillin  (AMOXIL ) 875 MG tablet, TAKE 1 TABLET BY MOUTH TWICE A DAY 7 DAYS (Patient not taking: Reported on 03/15/2024), Disp: , Rfl:    COMIRNATY syringe, , Disp: , Rfl:    gabapentin  (NEURONTIN ) 400 MG capsule,  Take 1 capsule (400 mg total) by mouth daily in the afternoon for 3 days. Continue the prior prescription of 100 mg twice a day in addition to the 400 mg once daily in the afternoon for a total of 600 mg daily which is the maximum allowed due to chronic kidney condition (Patient not taking: Reported on 03/08/2024), Disp: 3 capsule, Rfl: 0   memantine  (NAMENDA ) 5 MG tablet, Take 5 mg by mouth 2 (two) times daily. (Patient taking differently: Take 10 mg by mouth 2 (two) times daily.), Disp: , Rfl:    nicotine polacrilex (COMMIT) 2 MG lozenge, as directed Mouth/Throat every 4 hours (Patient not taking: Reported on 03/15/2024), Disp: , Rfl:    nystatin (MYCOSTATIN) 100000 UNIT/ML suspension, 4 mL swish and swallow Mouth/Throat Four times a day; Duration: 14 days (Patient not taking: Reported on 03/15/2024), Disp: , Rfl:  Allergies  Allergen Reactions   Ceftin Anaphylaxis    Face and throat swell    Cefuroxime Axetil Anaphylaxis    Face and throat swell   Geodon [Ziprasidone Hcl] Hives   Lisinopril  Other (See Comments)    angioedema  lisinopril   Other Reaction(s): angioedema   Shellfish Allergy Other (See Comments)    Gout exacerbation  shellfish derived   Shellfish Protein-Containing Drug Products Other (See Comments)    Other reaction(s): Other  shellfish derived   Cefuroxime     Other reaction(s): anaphylaxis  Other Reaction(s): Unknown   Sulfacetamide Sodium-Sulfur      Other reaction(s): itch   Ziprasidone     Other reaction(s): hives on left arm  Other Reaction(s): hives on left arm   Allopurinol  Nausea Only and Other (See Comments)    weakness  Other Reaction(s): nausea, weakness   Diazepam  Other (See Comments)    Patient states that diazepam  doesn't relax, it has the opposite effect.  Valium    Lorazepam  Itching   Sulfa Antibiotics Itching     Social History   Socioeconomic History   Marital status: Married    Spouse name: Dallas   Number of children: 2   Years  of education: 18   Highest education level: Master's degree (e.g., MA, MS, MEng,  MEd, MSW, MBA)  Occupational History   Occupation: retired  Tobacco Use   Smoking status: Every Day    Current packs/day: 0.00    Average packs/day: 0.5 packs/day for 26.0 years (13.0 ttl pk-yrs)    Types: Cigarettes    Start date: 03/24/1995    Last attempt to quit: 03/23/2021    Years since quitting: 3.0   Smokeless tobacco: Never  Vaping Use   Vaping status: Never Used  Substance and Sexual Activity   Alcohol use: No   Drug use: No   Sexual activity: Yes  Other Topics Concern   Not on file  Social History Narrative   Tobacco Use Cigarettes: Former Smoker, Quit in 2008   No Alcohol   No recreational drug use   Diet: Regular/Low Carb   Exercise: None   Occupation: disabled   Education: Company Secretary, masters   Children: 2   Firearms: No   Risk Analyst Use: Always   Former wellsite geologist.    Right handed   Two story home   Lives with spouse [place]      Social Drivers of Health   Financial Resource Strain: Low Risk  (09/17/2023)   Overall Financial Resource Strain (CARDIA)    Difficulty of Paying Living Expenses: Not very hard  Food Insecurity: No Food Insecurity (03/06/2024)   Hunger Vital Sign    Worried About Running Out of Food in the Last Year: Never true    Ran Out of Food in the Last Year: Never true  Transportation Needs: No Transportation Needs (03/06/2024)   PRAPARE - Administrator, Civil Service (Medical): No    Lack of Transportation (Non-Medical): No  Physical Activity: Insufficiently Active (09/17/2023)   Exercise Vital Sign    Days of Exercise per Week: 1 day    Minutes of Exercise per Session: 20 min  Stress: Stress Concern Present (09/17/2023)   [name] of Occupational Health - Occupational Stress Questionnaire    Feeling of Stress : To some extent  Social Connections: Socially Integrated (09/17/2023)   Social Connection and Isolation Panel     Frequency of Communication with Friends and Family: Twice a week    Frequency of Social Gatherings with Friends and Family: Twice a week    Attends Religious Services: 1 to 4 times per year    Active Member of Clubs or Organizations: Patient unable to answer    Attends Banker Meetings: 1 to 4 times per year    Marital Status: Married  Catering Manager Violence: Not At Risk (03/06/2024)   Humiliation, Afraid, Rape, and Kick questionnaire    Fear of Current or Ex-Partner: No    Emotionally Abused: No    Physically Abused: No    Sexually Abused: No    Physical Exam      Future Appointments  Date Time Provider Department Center  03/23/2024  9:00 AM Slusher, Santana LABOR, RN [facility] None  03/29/2024  7:05 AM [facility] H[facility] DEVICE REMOTES [facility]  04/26/2024  8:50 AM Tobie Breslow K, DO [facility] None  04/27/2024  1:45 PM Gaynel Delon CROME, DPM [facility]  05/03/2024  3:00 PM [facility] PA/NP [facility] None  06/12/2024  7:10 AM CVD HVT DEVICE REMOTES [facility]  09/07/2024  3:20 PM Arfeen, Leni DASEN, MD [facility] None  09/11/2024  7:10 AM [facility] H[facility] DEVICE REMOTES [facility]  12/11/2024  7:10 AM [facility] HVT DEVICE REMOTES [facility]  03/12/2025  7:10 AM CVD HVT DEVICE REMOTES  [facility]

## 2024-03-22 NOTE — Telephone Encounter (Signed)
 She takes metolazone  on Tuesdays. Would have her take an extra dose this week, either today or tomorrow. Take extra 40 mEq potassium with metolazone .

## 2024-03-23 ENCOUNTER — Other Ambulatory Visit: Payer: Self-pay

## 2024-03-23 NOTE — Patient Instructions (Signed)
 Visit Information  Thank you for taking time to visit with me today. Please don't hesitate to contact me if I can be of assistance to you before our next scheduled appointment.  Your next care management appointment is by telephone on Friday, November 28th  at 9:30am    Please call the care guide team at [phone] if you need to cancel, schedule, or reschedule an appointment.   Please call the [place]  National Suicide Prevention Lifeline: (925)740-2341 or TTY: 647-283-9152 TTY [phone]) to talk to a trained counselor if you are experiencing a Mental Health or Behavioral Health Crisis or need someone to talk to.  Santana Stamp BSN, CCM [place]  VBCI Population Health RN Care Manager Direct Dial: [phone]  Fax: [phone]

## 2024-03-23 NOTE — Patient Outreach (Signed)
 Complex Care Management   Visit Note  03/23/2024  Name:  Rhonda [name] MRN: [id number] DOB: [birth year]  Situation: Referral received for Complex Care Management related to Fall Risk, needs more help in the home. I obtained verbal consent from Caregiver.  Visit completed with Rhonda [name]/husband/DPR  on the phone. Patient/husband trying to get more hours for PCS, DSS did increase hours to eight a week, dividing up into two day, husband states this is acceptable for now.  States EBT card went missing, BSW provided contact number for him to call for replacement.    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/2013   Body mass index (BMI) 45.0-49.9, adult 06/11/2020   Bowel incontinence 06/11/2020   Cardiomyopathy    Cholelithiasis 06/11/2020   Chronic respiratory failure 09/14/2013   Chronic sinusitis 06/11/2020   Chronic systolic heart failure 10/27/2012   a) NICM b) ECHO (03/2013) EF 20-25% c) ECHO (09/2013) EF 45-50%, grade I DD   CKD (chronic kidney disease) stage 3, GFR 30-59 ml/min 11/08/2018   Cleft palate 06/11/2020   Complication of anesthesia    History of low blood pressure after surgery; attributed to lying flat   Daytime somnolence 06/11/2020   Decreased estrogen level 08/01/2021   Diabetic neuropathy    Diarrhea of presumed infectious origin 06/11/2020   Edema 06/11/2020   Elevated troponin 12/02/2021   Endometrial polyp 01/20/2012   Exertional shortness of breath    Generalized anxiety disorder    GERD (gastroesophageal reflux disease)    Gout 06/11/2020    Heart murmur    History of colonic polyps 06/11/2020   History of fall 06/11/2020   Hyperlipidemia    Hyperparathyroidism 08/01/2021   Hypertension    Hypokalemia 11/08/2018   Hyponatremia 10/08/2021   Hypothyroidism    Insomnia 06/11/2020   Intractable nausea and vomiting 10/08/2021   Irritable bowel syndrome 01/04/2020   Left bundle branch block    s/p CRT-D (04/2013)   Lumbago without sciatica 04/30/2021   Lumbar spondylosis with myelopathy 06/11/2020   Macrocytosis 06/11/2020   Major depressive disorder    Malignant tumor of breast 03/26/2011   Left; completed chemotherapy and radiation treatments   Migraines    Mild dementia 04/20/2023   On home oxygen  therapy    2L suppose to be q night (05/03/2013)   Orthostatic hypotension 07/28/2017   Spinal stenosis of lumbar region 01/03/2020   Spondylolisthesis 08/01/2021   SVD (spontaneous vaginal delivery)    x 2   Type II diabetes mellitus 06/15/2019   Varicose veins of bilateral lower extremities with other complications 08/01/2021   Vitamin D  deficiency 03/26/2011    Assessment: Patient Reported Symptoms:  Cognitive Cognitive Status: Able to follow simple commands, Requires Assistance Decision Making, Struggling with memory recall (This assessment performed with husband/DPR) Cognitive/Intellectual Conditions Management [RPT]: Other Other: Mild Dementia listed on health history   Health Maintenance Behaviors: Annual physical exam  Neurological Neurological Review of Symptoms: No symptoms reported Neurological Comment: Went to [facility] 03/01/24 for Neuropathy exaserbation due to ran out of gabapentin   and was not taking it.  Husband states they picked up med from pharmacy and she is taking as prescribed, neuropathy pain has calmed down.  HEENT HEENT Symptoms Reported: Not assessed      Cardiovascular Cardiovascular Symptoms Reported: Swelling in legs or feet Other Cardiovascular Symptoms: Swelling in ankles at baseline.   [name]/Paramedic Cardiac Program will be back today to check on fluid status. Cardiovascular Management Strategies: Medication therapy  Respiratory Respiratory Symptoms Reported: No symptoms reported    Endocrine Endocrine Symptoms Reported: Not assessed    Gastrointestinal Gastrointestinal Symptoms Reported: No symptoms reported      Genitourinary Genitourinary Symptoms Reported: Incontinence    Integumentary Integumentary Symptoms Reported: No symptoms reported    Musculoskeletal Musculoskelatal Symptoms Reviewed: Unsteady gait        Psychosocial Additional Psychological Details: Husband reports patient has good support system, DSS was able to increase aide hours to eight hours a week, aide visits 4 hours a day on Tues/Fridays.          03/23/2024    PHQ2-9 Depression Screening   Little interest or pleasure in doing things    Feeling down, depressed, or hopeless    PHQ-2 - Total Score    Trouble falling or staying asleep, or sleeping too much    Feeling tired or having little energy    Poor appetite or overeating     Feeling bad about yourself - or that you are a failure or have let yourself or your family down    Trouble concentrating on things, such as reading the newspaper or watching television    Moving or speaking so slowly that other people could have noticed.  Or the opposite - being so fidgety or restless that you have been moving around a lot more than usual    Thoughts that you would be better off dead, or hurting yourself in some way    PHQ2-9 Total Score    If you checked off any problems, how difficult have these problems made it for you to do your work, take care of things at home, or get along with other people    Depression Interventions/Treatment      There were no vitals filed for this visit.  MEDICATIONS:   Recommendation:   Specialty provider follow-up : Nephrology 04/05/2024; Podiatry 04/19/24; Neurology 04/26/24 Continue Current Plan of  Care Husband will call DSS to obtain new EBT card.  Offered list of Food Pantries in [place], he declines at this time.    Follow Up Plan:   Telephone follow-up in 1 month  Santana Stamp BSN, CCM [place]  VBCI Population Health RN Care Manager Direct Dial: [phone]  Fax: [phone]

## 2024-03-27 ENCOUNTER — Other Ambulatory Visit (HOSPITAL_COMMUNITY): Payer: Self-pay | Admitting: Physician Assistant

## 2024-03-29 ENCOUNTER — Telehealth: Payer: Self-pay

## 2024-03-29 ENCOUNTER — Ambulatory Visit: Attending: Internal Medicine

## 2024-03-29 ENCOUNTER — Other Ambulatory Visit (HOSPITAL_COMMUNITY): Payer: Self-pay

## 2024-03-29 DIAGNOSIS — I5022 Chronic systolic (congestive) heart failure: Secondary | ICD-10-CM | POA: Diagnosis not present

## 2024-03-29 DIAGNOSIS — Z95810 Presence of automatic (implantable) cardiac defibrillator: Secondary | ICD-10-CM

## 2024-03-29 NOTE — Telephone Encounter (Signed)
 Attempted call to paramedicine EMT [name].  Left message with 03/29/2024 remote transmission results regarding fluid levels.  Encouraged to call back if any questions.

## 2024-03-29 NOTE — Progress Notes (Signed)
 Paramedicine Encounter    Patient ID: Rhonda [name], female    DOB: 1950/12/14, 73 y.o.   MRN: [id number]   Complaints- muscular neck pain, lack of energy   Assessment- CAOX4, warm and dry seated in chair in her bedroom reporting neck pain due to sleeping in her chair rather than her bed last night, lower leg swelling with edema due to not elevating or wearing compression socks, lungs clear, vitals obtained as noted, no chest pain, dizziness or shortness of breath.   Compliance with meds- no missed doses   Pill box filled- one week   Refills needed- midodrine , allopurinol    Meds changes since last visit- took an extra metolazone  and K last week- weight down 5 lbs much improved.     Social changes- none    VISIT SUMMARY- Arrived for home visit for Rhonda [name] who reports to be feeling okay but having neck pain due to sleeping in her chair over night and not resting in her bed, lower legs are swollen due to same also. Encouraged wearing compression socks and elevating her legs. Vitals obtained. Lungs clear. Meds reviewed and confirmed. Pill box filled for one week. HF education provided. Appointments reviewed and confirmed. Home visit complete. I will see her in one week.   Rhonda [name] Rhonda [name] reviewed optivol and numbers improved since adding the extra metolazone  last Thursday.   BP 102/68   Pulse 94   Resp 16   Wt 197 lb (89.4 kg)   SpO2 95%   BMI 35.46 kg/m  Weight yesterday-- did not weigh  Last visit weight-- 205lbs      ACTION: Home visit completed     Patient Care Team: Chrystal Lamarr GORMAN, MD as PCP - General (Family Medicine) Waddell Danelle ORN, MD as PCP - Electrophysiology (Cardiology) Cathern Andriette DEL, LCSW as Social Worker (Licensed Clinical Social Worker) Tobie, Tonita POUR, DO as Consulting Physician (Neurology) Jacques Moats, Paramedic as Paramedic Chrystal, Lamarr GORMAN, MD as Attending Physician (Family Medicine) Bensimhon, Toribio SAUNDERS, MD as Consulting Physician  (Cardiology) Curry, Leni DASEN, MD as Consulting Physician (Psychiatry) Tobie Franky SQUIBB, DPM as Consulting Physician (Podiatry) Aniceto Daphne CROME, NP as Nurse Practitioner (Cardiology) Gaynel Delon CROME, DPM as Consulting Physician (Podiatry) Slusher, Santana LABOR, RN as Registered Nurse  Patient Active Problem List   Diagnosis Date Noted   Mild dementia 04/20/2023   Diabetic neuropathy    Elevated troponin 12/02/2021   Acute left ankle pain 10/11/2021   Hyponatremia 10/08/2021   Decreased estrogen level 08/01/2021   Hyperparathyroidism 08/01/2021   Spondylolisthesis 08/01/2021   Varicose veins of bilateral lower extremities with other complications 08/01/2021   Lumbago without sciatica 04/30/2021   Abnormal gait 06/11/2020   Allergic rhinitis 06/11/2020   Asthma 06/11/2020   Benign intracranial hypertension 06/11/2020   Body mass index (BMI) 45.0-49.9, adult 06/11/2020   Bowel incontinence 06/11/2020   Cholelithiasis 06/11/2020   Chronic sinusitis 06/11/2020   Cleft palate 06/11/2020   Daytime somnolence 06/11/2020   Edema 06/11/2020   Family history of malignant neoplasm of gastrointestinal tract 06/11/2020   Gout 06/11/2020   Insomnia 06/11/2020   Atrophic gastritis 06/11/2020   Lumbar spondylosis with myelopathy 06/11/2020   Macrocytosis 06/11/2020   History of colonic polyps 06/11/2020   Repeated falls 06/11/2020   Irritable bowel syndrome 01/04/2020   Spinal stenosis of lumbar region 01/03/2020   Bilateral leg weakness 08/01/2019   Type II diabetes mellitus 06/15/2019   Hypokalemia 11/08/2018   CKD (chronic kidney disease) stage 3,  GFR 30-59 ml/min 11/08/2018   Orthostatic hypotension 07/28/2017   On home oxygen  therapy    Migraines    Left bundle branch block    Hypothyroidism    Hypertension    Hyperlipidemia    Heart murmur    GERD (gastroesophageal reflux disease)    Exertional shortness of breath    Major depressive disorder    Back pain    Arthritis     Generalized anxiety disorder    Anemia    Chronic respiratory failure 09/14/2013   Biventricular ICD (implantable cardioverter-defibrillator) in place 08/04/2013   Chronic systolic heart failure 10/27/2012   Endometrial polyp 01/20/2012   Malignant tumor of breast 03/26/2011   Vitamin D  deficiency 03/26/2011    Current Outpatient Medications:    acetaminophen  (TYLENOL ) 500 MG tablet, Take 500 mg by mouth every 6 (six) hours as needed., Disp: , Rfl:    acetaZOLAMIDE  (DIAMOX ) 125 MG tablet, Take 1 tablet (125 mg total) by mouth daily., Disp: 90 tablet, Rfl: 3   albuterol  (VENTOLIN  HFA) 108 (90 Base) MCG/ACT inhaler, INHALE TWO PUFFS BY MOUTH EVERY 4 HOURS AS NEEDED, Disp: , Rfl:    allopurinol  (ZYLOPRIM ) 100 MG tablet, TAKE 1/2 TABLET (50 MG TOTAL) BY MOUTH DAILY., Disp: 45 tablet, Rfl: 1   azelastine  (ASTELIN ) 0.1 % nasal spray, Place 2 sprays into both nostrils 2 (two) times daily. Use in each nostril as directed, Disp: 30 mL, Rfl: 12   buPROPion  (WELLBUTRIN  XL) 150 MG 24 hr tablet, Take 1 tablet (150 mg total) by mouth daily., Disp: 30 tablet, Rfl: 5   Capsaicin 0.1 % CREA, 1 application as needed Externally Three times a day as needed, Disp: , Rfl:    carvedilol  (COREG ) 3.125 MG tablet, TAKE 1 TABLET (3.125 MG) BY MOUTH TWICE DAILY WITH MEALS, Disp: 180 tablet, Rfl: 3   citalopram  (CELEXA ) 20 MG tablet, Take 1 tablet (20 mg total) by mouth daily., Disp: 30 tablet, Rfl: 5   clonazePAM  (KLONOPIN ) 0.5 MG tablet, Take 1 tablet (0.5 mg total) by mouth at bedtime., Disp: 30 tablet, Rfl: 5   cyanocobalamin  (VITAMIN B12) 1000 MCG tablet, Take 1,000 mcg by mouth daily., Disp: , Rfl:    desloratadine  (CLARINEX ) 5 MG tablet, TAKE 1 TABLET (5 MG TOTAL) BY MOUTH DAILY FOR ALLERGIES, Disp: 90 tablet, Rfl: 3   famotidine (PEPCID) 20 MG tablet, Take 20 mg by mouth as needed for heartburn or indigestion., Disp: , Rfl:    ferrous sulfate  325 (65 FE) MG tablet, 1 tablet Orally Once a day, Disp: , Rfl:     fluticasone  (FLONASE ) 50 MCG/ACT nasal spray, Place 1 spray into both nostrils daily., Disp: , Rfl:    Furosemide  (FUROSCIX ) 80 MG/10ML CTKT, Inject 80 mg into the skin daily as needed. As directed by the heart failure clinic, Disp: 5 each, Rfl: 1   gabapentin  (NEURONTIN ) 100 MG capsule, TAKE 1 CAPSULE (100 MG TOTAL) BY MOUTH 2 (TWO) TIMES DAILY., Disp: 180 capsule, Rfl: 0   ipratropium (ATROVENT ) 0.03 % nasal spray, , Disp: , Rfl:    levothyroxine  (SYNTHROID ) 88 MCG tablet, TAKE 1 TABLET(88 MCG) BY MOUTH DAILY BEFORE BREAKFAST, Disp: 90 tablet, Rfl: 3   memantine  (NAMENDA ) 5 MG tablet, Take 5 mg by mouth 2 (two) times daily. (Patient taking differently: Take 10 mg by mouth 2 (two) times daily.), Disp: , Rfl:    metolazone  (ZAROXOLYN ) 2.5 MG tablet, TAKE 1 TABLET BY MOUTH ONCE A WEEK, Disp: 4 tablet, Rfl: 1  midodrine  (PROAMATINE ) 5 MG tablet, Take 1 tablet (5 mg total) by mouth 3 (three) times daily., Disp: 200 tablet, Rfl: 3   Multiple Vitamin (MULTIVITAMIN WITH MINERALS) TABS tablet, Take 1 tablet by mouth daily., Disp: , Rfl:    phenol (CHLORASEPTIC) 1.4 % LIQD, Use as directed 1 spray in the mouth or throat as needed for throat irritation / pain., Disp: , Rfl:    potassium chloride  SA (KLOR-CON  M) 20 MEQ tablet, Take 3 tablets (60 mEq total) by mouth 2 (two) times daily., Disp: 572 tablet, Rfl: 3   risperiDONE  (RISPERDAL ) 0.5 MG tablet, Take 1 tablet (0.5 mg total) by mouth 2 (two) times daily at 8 am and 4 pm., Disp: 60 tablet, Rfl: 5   simvastatin  (ZOCOR ) 10 MG tablet, Take 1 tablet (10 mg total) by mouth every evening., Disp: 90 tablet, Rfl: 3   spironolactone  (ALDACTONE ) 25 MG tablet, TAKE 1 TABLET (25 MG TOTAL) BY MOUTH EVERY EVENING., Disp: 90 tablet, Rfl: 3   tirzepatide  (MOUNJARO ) 12.5 MG/0.5ML Pen, INJECT 12.5 MG INTO THE SKIN ONCE A WEEK., Disp: 2 mL, Rfl: 3   torsemide  (DEMADEX ) 100 MG tablet, Take 1 tablet (100 mg total) by mouth 2 (two) times daily., Disp: 180 tablet, Rfl: 3    amoxicillin  (AMOXIL ) 875 MG tablet, TAKE 1 TABLET BY MOUTH TWICE A DAY 7 DAYS (Patient not taking: Reported on 03/29/2024), Disp: , Rfl:    COMIRNATY syringe, , Disp: , Rfl:    gabapentin  (NEURONTIN ) 400 MG capsule, Take 1 capsule (400 mg total) by mouth daily in the afternoon for 3 days. Continue the prior prescription of 100 mg twice a day in addition to the 400 mg once daily in the afternoon for a total of 600 mg daily which is the maximum allowed due to chronic kidney condition (Patient not taking: Reported on 03/29/2024), Disp: 3 capsule, Rfl: 0   nicotine polacrilex (COMMIT) 2 MG lozenge, as directed Mouth/Throat every 4 hours (Patient not taking: Reported on 03/29/2024), Disp: , Rfl:    nystatin (MYCOSTATIN) 100000 UNIT/ML suspension, 4 mL swish and swallow Mouth/Throat Four times a day; Duration: 14 days (Patient not taking: Reported on 03/29/2024), Disp: , Rfl:  Allergies  Allergen Reactions   Ceftin Anaphylaxis    Face and throat swell    Cefuroxime Axetil Anaphylaxis    Face and throat swell   Geodon [Ziprasidone Hcl] Hives   Lisinopril  Other (See Comments)    angioedema  lisinopril   Other Reaction(s): angioedema   Shellfish Allergy Other (See Comments)    Gout exacerbation  shellfish derived   Shellfish Protein-Containing Drug Products Other (See Comments)    Other reaction(s): Other  shellfish derived   Cefuroxime     Other reaction(s): anaphylaxis  Other Reaction(s): Unknown   Sulfacetamide Sodium-Sulfur      Other reaction(s): itch   Ziprasidone     Other reaction(s): hives on left arm  Other Reaction(s): hives on left arm   Allopurinol  Nausea Only and Other (See Comments)    weakness  Other Reaction(s): nausea, weakness   Diazepam  Other (See Comments)    Patient states that diazepam  doesn't relax, it has the opposite effect.  Valium    Lorazepam  Itching   Sulfa Antibiotics Itching     Social History   Socioeconomic History   Marital status: Married     Spouse name: Dallas   Number of children: 2   Years of education: 18   Highest education level: Master's degree (e.g., MA, MS, MEng, MEd,  MSW, MBA)  Occupational History   Occupation: retired  Tobacco Use   Smoking status: Every Day    Current packs/day: 0.00    Average packs/day: 0.5 packs/day for 26.0 years (13.0 ttl pk-yrs)    Types: Cigarettes    Start date: 03/24/1995    Last attempt to quit: 03/23/2021    Years since quitting: 3.0   Smokeless tobacco: Never  Vaping Use   Vaping status: Never Used  Substance and Sexual Activity   Alcohol use: No   Drug use: No   Sexual activity: Yes  Other Topics Concern   Not on file  Social History Narrative   Tobacco Use Cigarettes: Former Smoker, Quit in 2008   No Alcohol   No recreational drug use   Diet: Regular/Low Carb   Exercise: None   Occupation: disabled   Education: Company Secretary, masters   Children: 2   Firearms: No   Risk Analyst Use: Always   Former wellsite geologist.    Right handed   Two story home   Lives with spouse [place]      Social Drivers of Health   Financial Resource Strain: Low Risk  (09/17/2023)   Overall Financial Resource Strain (CARDIA)    Difficulty of Paying Living Expenses: Not very hard  Food Insecurity: No Food Insecurity (03/06/2024)   Hunger Vital Sign    Worried About Running Out of Food in the Last Year: Never true    Ran Out of Food in the Last Year: Never true  Transportation Needs: No Transportation Needs (03/06/2024)   PRAPARE - Administrator, Civil Service (Medical): No    Lack of Transportation (Non-Medical): No  Physical Activity: Insufficiently Active (09/17/2023)   Exercise Vital Sign    Days of Exercise per Week: 1 day    Minutes of Exercise per Session: 20 min  Stress: Stress Concern Present (09/17/2023)   [name] of Occupational Health - Occupational Stress Questionnaire    Feeling of Stress : To some extent  Social Connections: Socially Integrated  (09/17/2023)   Social Connection and Isolation Panel    Frequency of Communication with Friends and Family: Twice a week    Frequency of Social Gatherings with Friends and Family: Twice a week    Attends Religious Services: 1 to 4 times per year    Active Member of Clubs or Organizations: Patient unable to answer    Attends Banker Meetings: 1 to 4 times per year    Marital Status: Married  Catering Manager Violence: Not At Risk (03/06/2024)   Humiliation, Afraid, Rape, and Kick questionnaire    Fear of Current or Ex-Partner: No    Emotionally Abused: No    Physically Abused: No    Sexually Abused: No    Physical Exam      Future Appointments  Date Time Provider Department Center  04/21/2024  9:30 AM Slusher, Santana LABOR, RN [facility] None  04/26/2024  8:50 AM Tobie Breslow K, DO [facility] None  04/27/2024  1:45 PM Gaynel Delon CROME, DPM [facility]  05/03/2024  3:00 PM [facility] PA/NP [facility] None  05/10/2024  7:00 AM [facility] HVT DEVICE REMOTES [facility] H&V  06/12/2024  7:10 AM [facility] HVT DEVICE REMOTES [facility] H&V  09/07/2024  3:20 PM Arfeen, Leni DASEN, MD [facility] None  09/11/2024  7:10 AM [facility] HVT DEVICE REMOTES [facility] H&V  12/11/2024  7:10 AM [facility] HVT DEVICE REMOTES [facility] H&V  03/12/2025  7:10 AM [facility] HVT DEVICE REMOTES [facility]  H&V

## 2024-03-29 NOTE — Progress Notes (Signed)
 EPIC Encounter for ICM Monitoring  Patient Name: Rhonda [name] is a 73 y.o. female Date: 03/29/2024 Primary Care Physican: Chrystal Lamarr GORMAN, MD Primary Cardiologist: Bensimhon Electrophysiologist: Waddell Pore Pacing: 99.8%   06/01/2022 Weight: 193 lbs  04/26/2023 Weight: 214 lbs Per EMT note 07/14/2023 Weight: 226 lbs 10/13/2023 Office Weight: 210 lbs 12/15/2023 Weight: 206.3 lbs per [name], paramedic visit 01/12/2024 Weight: 205.8 lbs per [name], paramedic visit  01/19/2024 Weight: 203 lbs 02/10/2024 Weight: 202 lbs per [name] 03/22/2024 Weight: 205 lbs per [name]   Clinical Status Since 23-Feb-2024 Time in AT/AF  <0.1 hr/day (<0.1%)          Transmission results reviewed.   Left message for [name] Paramedicine EMT regarding results.                  Since 03/08/2024 ICM Remote Transmission: Optivol thoracic impedance suggesting possible fluid accumulation from 03/15/2024-03/23/2024 (extra dose of Metolazone  on 03/23/2024) and returned to baseline 03/24/2024.   Prescribed:  [name] with paramedicine program assists with meds.   Torsemide  100 mg 1 tablet(s) (100 mg total) by mouth twice a day  Potassium 20 meq 3 tablets (60 mEq total) by mouth 2 (twice) a day.   Metolazone  2.5 mg 1 tablet by mouth once a week (every Tuesday). Spironolactone  25 mg take 1 tablet by mouth every evening.   Labs: 03/16/2024 Creatinine 2.26, BUN 49, Potassium 3.5, Sodium 134, GFR 23 03/01/2024 Creatinine 2.56, BUN 53, Potassium 4.5, Sodium 132, GFR 16 02/23/2024 Creatinine 2.29, BUN 46, Potassium 3.5, Sodium 134, GFR 22 01/26/2024 Creatinine 1.99, BUN 38, Potassium 3.7, Sodium 135, GFR 26, BNP 29.3 10/25/2023 Creatinine 1.78, BUN 46, Potassium 3.3, Sodium 136, GFR 30 10/13/2023 Creatinine 1.91, BUN 41, Potassium 3.5, Sodium 135, GFR 28, BNP 37.5 07/28/2023 Creatinine 2.06, BUN 52, Potassium 3.5, Sodium 135, eGFR  25 07/14/2023 Creatinine 1.91, BUN 47, Potassium 3.3, Sodium 134, eGFR 28, BNP 71.5 A complete set of results can be found in Results Review.   Recommendations:  No changes.      Follow-up plan: ICM clinic phone appointment on 05/10/2024.   91 day device clinic remote transmission 06/12/2024.     EP/Cardiology Office Visits:   05/03/2024 with HF Clinic.   Recall 05/11/2024 with Dr Cherrie.  Recall 03/21/2024 with Dr Waddell.     Copy of ICM check sent to Dr. Waddell.   Remote monitoring is medically necessary for Heart Failure Management.    Daily Thoracic Impedance ICM trend: 12/29/2023 through 03/29/2024.    12-14 Month Thoracic Impedance ICM trend:     [name], RN 03/29/2024 11:16 AM

## 2024-03-30 DIAGNOSIS — N184 Chronic kidney disease, stage 4 (severe): Secondary | ICD-10-CM | POA: Diagnosis not present

## 2024-04-03 NOTE — Telephone Encounter (Signed)
 Message received- noted fluid returned back to baseline. Symptoms improved- will continue to monitor weekly.   [name], EMT-Paramedic [phone] 04/03/2024

## 2024-04-05 ENCOUNTER — Other Ambulatory Visit (HOSPITAL_COMMUNITY): Payer: Self-pay

## 2024-04-05 ENCOUNTER — Telehealth: Payer: Self-pay

## 2024-04-05 NOTE — Telephone Encounter (Signed)
 Spoke with [name], EMT with paramedicine program.  She is visiting patient at home today and sent remote transmission for review due to patient has gained 7 lbs along with leg swelling.  Pt missed 2 doses of Torsemide  100 mg twice in the past week. She also has been eating soup high in salt for several days.  She does not wear compression stockings for leg swelling or prop legs up when sitting even though she has been encouraged to do so by [employer].    Optivol thoracic impedance suggesting possible fluid accumulation starting 04/01/2024 which correlates with missing 2 Torsemide  dosages this past week and eating foods high in salt.     Will recheck remote transmission on 04/12/2024 and call [name] with results.  Powell will reach out to HF clinic if needed for any ongoing patient symptoms.

## 2024-04-05 NOTE — Progress Notes (Signed)
 Paramedicine Encounter    Patient ID: Rhonda [name], female    DOB: 1950/06/04, 73 y.o.   MRN: [id number]   Complaints- headache, lower leg swelling, weight gain.   Assessment- CAOX4, warm and dry seated in her bedroom chair reporting headache. Assessment noted weight gain of 7lbs since last Wednesday and lower leg swelling. She missed two night doses of meds, eating high sodium filled foods and not elevating her legs as instructed. Lungs clear, vitals normal otherwise.   Compliance with meds- missed two night doses   Pill box filled- for one week   Refills needed- midodrine , allopurinol , citalprolam   Meds changes since last visit- none     Social changes- heavy discussion about diet and HF management today with patient, husband and daughter in the home.    VISIT SUMMARY- Arrived for home visit for [place] who reports to be feeling okay with a headache. Vitals obtained. Weight noted to be up 7 lbs- lower legs swollen, admits to not elevating her legs, not weighing daily, she has been eating high sodium foods and not exercising, she also missed two night doses of meds. I sent in an optivol transmission and [name] RN reviewed and noted fluid increase the last few days. No call into HF triage due to missed meds, admission of high salty foods and plans to improve. I talked at length to her about all these things and educated her the importance of compliance to stay healthy, at home and to feel better. She and family agreed with plans. I reviewed meds and filled pill box for one week. Refills will be called into [facility]. I plan to see her in one week. Home visit complete.   BP 108/60   Pulse 61   Resp 16   Wt 203 lb (92.1 kg)   SpO2 98%   BMI 36.54 kg/m  Weight yesterday-- didn't weigh  Last visit weight-- 197lbs      ACTION: Home visit completed     Patient Care Team: Chrystal Lamarr GORMAN, MD as PCP - General (Family Medicine) Waddell Danelle ORN, MD as PCP -  Electrophysiology (Cardiology) Cathern Andriette DEL, LCSW as Social Worker (Licensed Clinical Social Worker) Tobie, Tonita POUR, DO as Consulting Physician (Neurology) Jacques Moats, Paramedic as Paramedic Chrystal, Lamarr GORMAN, MD as Attending Physician (Family Medicine) Bensimhon, Toribio SAUNDERS, MD as Consulting Physician (Cardiology) Curry, Leni DASEN, MD as Consulting Physician (Psychiatry) Tobie Franky SQUIBB, DPM as Consulting Physician (Podiatry) Aniceto Daphne CROME, NP as Nurse Practitioner (Cardiology) Gaynel Delon CROME, DPM as Consulting Physician (Podiatry) Slusher, Santana LABOR, RN as Registered Nurse  Patient Active Problem List   Diagnosis Date Noted   Mild dementia 04/20/2023   Diabetic neuropathy    Elevated troponin 12/02/2021   Acute left ankle pain 10/11/2021   Hyponatremia 10/08/2021   Decreased estrogen level 08/01/2021   Hyperparathyroidism 08/01/2021   Spondylolisthesis 08/01/2021   Varicose veins of bilateral lower extremities with other complications 08/01/2021   Lumbago without sciatica 04/30/2021   Abnormal gait 06/11/2020   Allergic rhinitis 06/11/2020   Asthma 06/11/2020   Benign intracranial hypertension 06/11/2020   Body mass index (BMI) 45.0-49.9, adult 06/11/2020   Bowel incontinence 06/11/2020   Cholelithiasis 06/11/2020   Chronic sinusitis 06/11/2020   Cleft palate 06/11/2020   Daytime somnolence 06/11/2020   Edema 06/11/2020   Family history of malignant neoplasm of gastrointestinal tract 06/11/2020   Gout 06/11/2020   Insomnia 06/11/2020   Atrophic gastritis 06/11/2020   Lumbar spondylosis with myelopathy  06/11/2020   Macrocytosis 06/11/2020   History of colonic polyps 06/11/2020   Repeated falls 06/11/2020   Irritable bowel syndrome 01/04/2020   Spinal stenosis of lumbar region 01/03/2020   Bilateral leg weakness 08/01/2019   Type II diabetes mellitus 06/15/2019   Hypokalemia 11/08/2018   CKD (chronic kidney disease) stage 3, GFR 30-59 ml/min 11/08/2018    Orthostatic hypotension 07/28/2017   On home oxygen  therapy    Migraines    Left bundle branch block    Hypothyroidism    Hypertension    Hyperlipidemia    Heart murmur    GERD (gastroesophageal reflux disease)    Exertional shortness of breath    Major depressive disorder    Back pain    Arthritis    Generalized anxiety disorder    Anemia    Chronic respiratory failure 09/14/2013   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   Chronic systolic heart failure 10/27/2012   Endometrial polyp 01/20/2012   Malignant tumor of breast 03/26/2011   Vitamin D  deficiency 03/26/2011    Current Outpatient Medications:    acetaminophen  (TYLENOL ) 500 MG tablet, Take 500 mg by mouth every 6 (six) hours as needed., Disp: , Rfl:    acetaZOLAMIDE  (DIAMOX ) 125 MG tablet, Take 1 tablet (125 mg total) by mouth daily., Disp: 90 tablet, Rfl: 3   albuterol  (VENTOLIN  HFA) 108 (90 Base) MCG/ACT inhaler, INHALE TWO PUFFS BY MOUTH EVERY 4 HOURS AS NEEDED, Disp: , Rfl:    allopurinol  (ZYLOPRIM ) 100 MG tablet, TAKE 1/2 TABLET (50 MG TOTAL) BY MOUTH DAILY., Disp: 45 tablet, Rfl: 1   azelastine  (ASTELIN ) 0.1 % nasal spray, Place 2 sprays into both nostrils 2 (two) times daily. Use in each nostril as directed, Disp: 30 mL, Rfl: 12   buPROPion  (WELLBUTRIN  XL) 150 MG 24 hr tablet, Take 1 tablet (150 mg total) by mouth daily., Disp: 30 tablet, Rfl: 5   Capsaicin 0.1 % CREA, 1 application as needed Externally Three times a day as needed, Disp: , Rfl:    carvedilol  (COREG ) 3.125 MG tablet, TAKE 1 TABLET (3.125 MG) BY MOUTH TWICE DAILY WITH MEALS, Disp: 180 tablet, Rfl: 3   citalopram  (CELEXA ) 20 MG tablet, Take 1 tablet (20 mg total) by mouth daily., Disp: 30 tablet, Rfl: 5   clonazePAM  (KLONOPIN ) 0.5 MG tablet, Take 1 tablet (0.5 mg total) by mouth at bedtime., Disp: 30 tablet, Rfl: 5   cyanocobalamin  (VITAMIN B12) 1000 MCG tablet, Take 1,000 mcg by mouth daily., Disp: , Rfl:     desloratadine  (CLARINEX ) 5 MG tablet, TAKE 1 TABLET (5 MG TOTAL) BY MOUTH DAILY FOR ALLERGIES, Disp: 90 tablet, Rfl: 3   famotidine (PEPCID) 20 MG tablet, Take 20 mg by mouth as needed for heartburn or indigestion., Disp: , Rfl:    ferrous sulfate  325 (65 FE) MG tablet, 1 tablet Orally Once a day, Disp: , Rfl:    fluticasone  (FLONASE ) 50 MCG/ACT nasal spray, Place 1 spray into both nostrils daily., Disp: , Rfl:    Furosemide  (FUROSCIX ) 80 MG/10ML CTKT, Inject 80 mg into the skin daily as needed. As directed by the heart failure clinic, Disp: 5 each, Rfl: 1   gabapentin  (NEURONTIN ) 100 MG capsule, TAKE 1 CAPSULE (100 MG TOTAL) BY MOUTH 2 (TWO) TIMES DAILY., Disp: 180 capsule, Rfl: 0   ipratropium (ATROVENT ) 0.03 % nasal spray, , Disp: , Rfl:    levothyroxine  (SYNTHROID ) 88 MCG tablet, TAKE 1 TABLET(88 MCG) BY MOUTH DAILY BEFORE BREAKFAST, Disp: 90  tablet, Rfl: 3   memantine  (NAMENDA ) 5 MG tablet, Take 5 mg by mouth 2 (two) times daily. (Patient taking differently: Take 10 mg by mouth 2 (two) times daily.), Disp: , Rfl:    metolazone  (ZAROXOLYN ) 2.5 MG tablet, TAKE 1 TABLET BY MOUTH ONCE A WEEK, Disp: 4 tablet, Rfl: 1   midodrine  (PROAMATINE ) 5 MG tablet, Take 1 tablet (5 mg total) by mouth 3 (three) times daily., Disp: 200 tablet, Rfl: 3   Multiple Vitamin (MULTIVITAMIN WITH MINERALS) TABS tablet, Take 1 tablet by mouth daily., Disp: , Rfl:    phenol (CHLORASEPTIC) 1.4 % LIQD, Use as directed 1 spray in the mouth or throat as needed for throat irritation / pain., Disp: , Rfl:    potassium chloride  SA (KLOR-CON  M) 20 MEQ tablet, Take 3 tablets (60 mEq total) by mouth 2 (two) times daily., Disp: 572 tablet, Rfl: 3   risperiDONE  (RISPERDAL ) 0.5 MG tablet, Take 1 tablet (0.5 mg total) by mouth 2 (two) times daily at 8 am and 4 pm., Disp: 60 tablet, Rfl: 5   simvastatin  (ZOCOR ) 10 MG tablet, Take 1 tablet (10 mg total) by mouth every evening., Disp: 90 tablet, Rfl: 3   spironolactone  (ALDACTONE ) 25 MG  tablet, TAKE 1 TABLET (25 MG TOTAL) BY MOUTH EVERY EVENING., Disp: 90 tablet, Rfl: 3   tirzepatide  (MOUNJARO ) 12.5 MG/0.5ML Pen, INJECT 12.5 MG INTO THE SKIN ONCE A WEEK., Disp: 2 mL, Rfl: 3   torsemide  (DEMADEX ) 100 MG tablet, Take 1 tablet (100 mg total) by mouth 2 (two) times daily., Disp: 180 tablet, Rfl: 3   amoxicillin  (AMOXIL ) 875 MG tablet, TAKE 1 TABLET BY MOUTH TWICE A DAY 7 DAYS (Patient not taking: Reported on 03/29/2024), Disp: , Rfl:    COMIRNATY syringe, , Disp: , Rfl:    gabapentin  (NEURONTIN ) 400 MG capsule, Take 1 capsule (400 mg total) by mouth daily in the afternoon for 3 days. Continue the prior prescription of 100 mg twice a day in addition to the 400 mg once daily in the afternoon for a total of 600 mg daily which is the maximum allowed due to chronic kidney condition (Patient not taking: Reported on 03/29/2024), Disp: 3 capsule, Rfl: 0   nicotine polacrilex (COMMIT) 2 MG lozenge, as directed Mouth/Throat every 4 hours (Patient not taking: Reported on 03/29/2024), Disp: , Rfl:    nystatin (MYCOSTATIN) 100000 UNIT/ML suspension, 4 mL swish and swallow Mouth/Throat Four times a day; Duration: 14 days (Patient not taking: Reported on 03/29/2024), Disp: , Rfl:  Allergies  Allergen Reactions   Ceftin Anaphylaxis    Face and throat swell    Cefuroxime Axetil Anaphylaxis    Face and throat swell   Geodon [Ziprasidone Hcl] Hives   Lisinopril  Other (See Comments)    angioedema  lisinopril   Other Reaction(s): angioedema   Shellfish Allergy Other (See Comments)    Gout exacerbation  shellfish derived   Shellfish Protein-Containing Drug Products Other (See Comments)    Other reaction(s): Other  shellfish derived   Cefuroxime     Other reaction(s): anaphylaxis  Other Reaction(s): Unknown   Sulfacetamide Sodium-Sulfur      Other reaction(s): itch   Ziprasidone     Other reaction(s): hives on left arm  Other Reaction(s): hives on left arm   Allopurinol  Nausea Only and Other  (See Comments)    weakness  Other Reaction(s): nausea, weakness   Diazepam  Other (See Comments)    Patient states that diazepam  doesn't relax, it has the opposite  effect.  Valium    Lorazepam  Itching   Sulfa Antibiotics Itching     Social History   Socioeconomic History   Marital status: Married    Spouse name: [place]   Number of children: 2   Years of education: 18   Highest education level: Master's degree (e.g., MA, MS, MEng, MEd, MSW, MBA)  Occupational History   Occupation: retired  Tobacco Use   Smoking status: Every Day    Current packs/day: 0.00    Average packs/day: 0.5 packs/day for 26.0 years (13.0 ttl pk-yrs)    Types: Cigarettes    Start date: 03/24/1995    Last attempt to quit: 03/23/2021    Years since quitting: 3.0   Smokeless tobacco: Never  Vaping Use   Vaping status: Never Used  Substance and Sexual Activity   Alcohol use: No   Drug use: No   Sexual activity: Yes  Other Topics Concern   Not on file  Social History Narrative   Tobacco Use Cigarettes: Former Smoker, Quit in 2008   No Alcohol   No recreational drug use   Diet: Regular/Low Carb   Exercise: None   Occupation: disabled   Education: Company Secretary, masters   Children: 2   Firearms: No   Risk Analyst Use: Always   Former wellsite geologist.    Right handed   Two story home   Lives with spouse [place]      Social Drivers of Health   Financial Resource Strain: Low Risk  (09/17/2023)   Overall Financial Resource Strain (CARDIA)    Difficulty of Paying Living Expenses: Not very hard  Food Insecurity: No Food Insecurity (03/06/2024)   Hunger Vital Sign    Worried About Running Out of Food in the Last Year: Never true    Ran Out of Food in the Last Year: Never true  Transportation Needs: No Transportation Needs (03/06/2024)   PRAPARE - Administrator, Civil Service (Medical): No    Lack of Transportation (Non-Medical): No  Physical Activity: Insufficiently Active (09/17/2023)    Exercise Vital Sign    Days of Exercise per Week: 1 day    Minutes of Exercise per Session: 20 min  Stress: Stress Concern Present (09/17/2023)   [name] of Occupational Health - Occupational Stress Questionnaire    Feeling of Stress : To some extent  Social Connections: Socially Integrated (09/17/2023)   Social Connection and Isolation Panel    Frequency of Communication with Friends and Family: Twice a week    Frequency of Social Gatherings with Friends and Family: Twice a week    Attends Religious Services: 1 to 4 times per year    Active Member of Clubs or Organizations: Patient unable to answer    Attends Banker Meetings: 1 to 4 times per year    Marital Status: Married  Catering Manager Violence: Not At Risk (03/06/2024)   Humiliation, Afraid, Rape, and Kick questionnaire    Fear of Current or Ex-Partner: No    Emotionally Abused: No    Physically Abused: No    Sexually Abused: No    Physical Exam      Future Appointments  Date Time Provider Department Center  04/12/2024  7:15 AM [facility] HVT DEVICE REMOTES [facility]  04/21/2024  9:30 AM Slusher, Santana LABOR, RN [facility] None  04/26/2024  8:50 AM Tobie Breslow K, DO [facility] None  04/27/2024  1:45 PM Gaynel Delon CROME, DPM [facility]  05/03/2024  3:00 PM [facility]  PA/NP [facility] None  05/10/2024  7:00 AM [facility] HVT DEVICE REMOTES [facility]  06/12/2024  7:10 AM [facility] HVT DEVICE REMOTES [facility]  09/07/2024  3:20 PM Arfeen, Leni DASEN, MD [facility] None  09/11/2024  7:10 AM [facility] HVT DEVICE REMOTES [facility]  12/11/2024  7:10 AM [facility] HVT DEVICE REMOTES [facility]  03/12/2025  7:10 AM [facility] HVT DEVICE REMOTES [facility]

## 2024-04-06 ENCOUNTER — Other Ambulatory Visit (HOSPITAL_COMMUNITY): Payer: Self-pay | Admitting: Internal Medicine

## 2024-04-12 ENCOUNTER — Ambulatory Visit: Attending: Internal Medicine

## 2024-04-12 ENCOUNTER — Encounter (HOSPITAL_COMMUNITY): Payer: Self-pay

## 2024-04-12 ENCOUNTER — Other Ambulatory Visit (HOSPITAL_COMMUNITY): Payer: Self-pay

## 2024-04-12 DIAGNOSIS — I5022 Chronic systolic (congestive) heart failure: Secondary | ICD-10-CM

## 2024-04-12 DIAGNOSIS — Z95810 Presence of automatic (implantable) cardiac defibrillator: Secondary | ICD-10-CM

## 2024-04-12 NOTE — Progress Notes (Signed)
 EPIC Encounter for ICM Monitoring  Patient Name: Rhonda [name] is a 73 y.o. female Date: 04/12/2024 Primary Care Physican: Chrystal Lamarr GORMAN, MD Primary Cardiologist: Bensimhon Electrophysiologist: Waddell Pore Pacing: 99.8%   06/01/2022 Weight: 193 lbs  04/26/2023 Weight: 214 lbs Per EMT note 07/14/2023 Weight: 226 lbs 10/13/2023 Office Weight: 210 lbs 12/15/2023 Weight: 206.3 lbs per [name], paramedic visit 01/12/2024 Weight: 205.8 lbs per [name], paramedic visit  01/19/2024 Weight: 203 lbs 02/10/2024 Weight: 202 lbs per [name] 03/22/2024 Weight: 205 lbs per [name] 04/12/2024 Weight: 198.9 per [name]   Clinical Status Since 23-Feb-2024 Time in AT/AF  <0.1 hr/day (<0.1%)          Transmission results reviewed.  Message from [name], [name] that she is visiting today, 04/12/2024 and assisted in sending remote transmission.  Transmission results review  Pt has not lower extremity swelling.                Since 03/29/2024 ICM Remote Transmission: Optivol thoracic impedance suggesting fluid levels normal since 03/24/2024 with the exception of possible fluid accumulation from 04/06/2024 and back to baseline 04/12/2024.   Prescribed:  [name] with paramedicine program assists with meds.   Torsemide  100 mg 1 tablet(s) (100 mg total) by mouth twice a day  Potassium 20 meq 3 tablets (60 mEq total) by mouth 2 (twice) a day.   Metolazone  2.5 mg 1 tablet by mouth once a week (every Tuesday). Spironolactone  25 mg take 1 tablet by mouth every evening.   Labs: 03/16/2024 Creatinine 2.26, BUN 49, Potassium 3.5, Sodium 134, GFR 23 03/01/2024 Creatinine 2.56, BUN 53, Potassium 4.5, Sodium 132, GFR 16 02/23/2024 Creatinine 2.29, BUN 46, Potassium 3.5, Sodium 134, GFR 22 01/26/2024 Creatinine 1.99, BUN 38, Potassium 3.7, Sodium 135, GFR 26, BNP 29.3 10/25/2023 Creatinine  1.78, BUN 46, Potassium 3.3, Sodium 136, GFR 30 10/13/2023 Creatinine 1.91, BUN 41, Potassium 3.5, Sodium 135, GFR 28, BNP 37.5 07/28/2023 Creatinine 2.06, BUN 52, Potassium 3.5, Sodium 135, eGFR 25 07/14/2023 Creatinine 1.91, BUN 47, Potassium 3.3, Sodium 134, eGFR 28, BNP 71.5 A complete set of results can be found in Results Review.   Recommendations:  No changes.      Follow-up plan: ICM clinic phone appointment on 05/10/2024.   91 day device clinic remote transmission 06/12/2024.     EP/Cardiology Office Visits:   05/03/2024 with HF Clinic.   Recall 05/11/2024 with Dr Cherrie.  Recall 03/21/2024 with Dr Waddell.     Copy of ICM check sent to Dr. Waddell.   Remote monitoring is medically necessary for Heart Failure Management.    Daily Thoracic Impedance ICM trend: 01/12/2024 through 04/12/2024.    12-14 Month Thoracic Impedance ICM trend:     [name], RN 04/12/2024 10:57 AM

## 2024-04-12 NOTE — Progress Notes (Signed)
 Paramedicine Encounter    Patient ID: Rhonda [name], female    DOB: 1950/09/03, 73 y.o.   MRN: [id number]   Complaints- headache   Assessment- CAOX4, warm and dry seated in the bed room   Compliance with meds- missed last nights meds   Pill box filled- for two weeks   Refills needed- risperidone , burpropion, memantine    Meds changes since last visit- none     Social changes- none    VISIT SUMMARY- Arrived for home visit for Rhonda [name] who reports to be feeling ok but has a headache, this is common for her. I obtained vitals and assessment. Swelling much improved- she has been elevating her feet more. Lungs clear. Weight is down this week 7 lbs. No shortness of breath, chest pain or dizziness. She did miss last night dose of meds. I talked with [name] about her optivol levels- they are looking good. She is trying on her diet to do better. Family is also on board. Meds reviewed and confirmed. I filled pill box for two weeks. Refills to be called in as noted. Appointments reviewed and wrote down for Ed her husband. HF education and management provided. Home visit complete. I will see Trey in two weeks.   BP 118/60   Pulse 85   Resp 16   Wt 198 lb (89.8 kg)   SpO2 95%   BMI 35.64 kg/m  Weight yesterday-- didn't weigh  Last visit weight-- 203lbs      ACTION: Home visit completed     Patient Care Team: Chrystal Lamarr GORMAN, MD as PCP - General (Family Medicine) Waddell Danelle ORN, MD as PCP - Electrophysiology (Cardiology) Cathern Andriette DEL, LCSW as Social Worker (Licensed Clinical Social Worker) Tobie, Tonita POUR, DO as Consulting Physician (Neurology) Jacques Moats, Paramedic as Paramedic Chrystal, Lamarr GORMAN, MD as Attending Physician (Family Medicine) Bensimhon, Toribio SAUNDERS, MD as Consulting Physician (Cardiology) Curry, Leni DASEN, MD as Consulting Physician (Psychiatry) Tobie Franky SQUIBB, DPM as Consulting Physician (Podiatry) Aniceto Daphne CROME, NP as Nurse Practitioner  (Cardiology) Gaynel Delon CROME, DPM as Consulting Physician (Podiatry) Slusher, Santana LABOR, RN as Registered Nurse  Patient Active Problem List   Diagnosis Date Noted   Mild dementia 04/20/2023   Diabetic neuropathy    Elevated troponin 12/02/2021   Acute left ankle pain 10/11/2021   Hyponatremia 10/08/2021   Decreased estrogen level 08/01/2021   Hyperparathyroidism 08/01/2021   Spondylolisthesis 08/01/2021   Varicose veins of bilateral lower extremities with other complications 08/01/2021   Lumbago without sciatica 04/30/2021   Abnormal gait 06/11/2020   Allergic rhinitis 06/11/2020   Asthma 06/11/2020   Benign intracranial hypertension 06/11/2020   Body mass index (BMI) 45.0-49.9, adult 06/11/2020   Bowel incontinence 06/11/2020   Cholelithiasis 06/11/2020   Chronic sinusitis 06/11/2020   Cleft palate 06/11/2020   Daytime somnolence 06/11/2020   Edema 06/11/2020   Family history of malignant neoplasm of gastrointestinal tract 06/11/2020   Gout 06/11/2020   Insomnia 06/11/2020   Atrophic gastritis 06/11/2020   Lumbar spondylosis with myelopathy 06/11/2020   Macrocytosis 06/11/2020   History of colonic polyps 06/11/2020   Repeated falls 06/11/2020   Irritable bowel syndrome 01/04/2020   Spinal stenosis of lumbar region 01/03/2020   Bilateral leg weakness 08/01/2019   Type II diabetes mellitus 06/15/2019   Hypokalemia 11/08/2018   CKD (chronic kidney disease) stage 3, GFR 30-59 ml/min 11/08/2018   Orthostatic hypotension 07/28/2017   On home oxygen  therapy    Migraines    Left  bundle branch block    Hypothyroidism    Hypertension    Hyperlipidemia    Heart murmur    GERD (gastroesophageal reflux disease)    Exertional shortness of breath    Major depressive disorder    Back pain    Arthritis    Generalized anxiety disorder    Anemia    Chronic respiratory failure 09/14/2013   Biventricular ICD (implantable cardioverter-defibrillator) in place 08/04/2013    Chronic systolic heart failure 10/27/2012   Endometrial polyp 01/20/2012   Malignant tumor of breast 03/26/2011   Vitamin D  deficiency 03/26/2011    Current Outpatient Medications:    acetaminophen  (TYLENOL ) 500 MG tablet, Take 500 mg by mouth every 6 (six) hours as needed., Disp: , Rfl:    acetaZOLAMIDE  (DIAMOX ) 125 MG tablet, Take 1 tablet (125 mg total) by mouth daily., Disp: 90 tablet, Rfl: 3   albuterol  (VENTOLIN  HFA) 108 (90 Base) MCG/ACT inhaler, INHALE TWO PUFFS BY MOUTH EVERY 4 HOURS AS NEEDED, Disp: , Rfl:    allopurinol  (ZYLOPRIM ) 100 MG tablet, Take 0.5 tablets (50 mg total) by mouth daily., Disp: 45 tablet, Rfl: 2   azelastine  (ASTELIN ) 0.1 % nasal spray, Place 2 sprays into both nostrils 2 (two) times daily. Use in each nostril as directed, Disp: 30 mL, Rfl: 12   buPROPion  (WELLBUTRIN  XL) 150 MG 24 hr tablet, Take 1 tablet (150 mg total) by mouth daily., Disp: 30 tablet, Rfl: 5   carvedilol  (COREG ) 3.125 MG tablet, TAKE 1 TABLET (3.125 MG) BY MOUTH TWICE DAILY WITH MEALS, Disp: 180 tablet, Rfl: 3   citalopram  (CELEXA ) 20 MG tablet, Take 1 tablet (20 mg total) by mouth daily., Disp: 30 tablet, Rfl: 5   clonazePAM  (KLONOPIN ) 0.5 MG tablet, Take 1 tablet (0.5 mg total) by mouth at bedtime., Disp: 30 tablet, Rfl: 5   cyanocobalamin  (VITAMIN B12) 1000 MCG tablet, Take 1,000 mcg by mouth daily., Disp: , Rfl:    desloratadine  (CLARINEX ) 5 MG tablet, TAKE 1 TABLET (5 MG TOTAL) BY MOUTH DAILY FOR ALLERGIES, Disp: 90 tablet, Rfl: 3   famotidine (PEPCID) 20 MG tablet, Take 20 mg by mouth as needed for heartburn or indigestion., Disp: , Rfl:    ferrous sulfate  325 (65 FE) MG tablet, 1 tablet Orally Once a day, Disp: , Rfl:    fluticasone  (FLONASE ) 50 MCG/ACT nasal spray, Place 1 spray into both nostrils daily., Disp: , Rfl:    Furosemide  (FUROSCIX ) 80 MG/10ML CTKT, Inject 80 mg into the skin daily as needed. As directed by the heart failure clinic, Disp: 5 each, Rfl: 1   gabapentin   (NEURONTIN ) 100 MG capsule, TAKE 1 CAPSULE (100 MG TOTAL) BY MOUTH 2 (TWO) TIMES DAILY., Disp: 180 capsule, Rfl: 0   ipratropium (ATROVENT ) 0.03 % nasal spray, , Disp: , Rfl:    levothyroxine  (SYNTHROID ) 88 MCG tablet, TAKE 1 TABLET(88 MCG) BY MOUTH DAILY BEFORE BREAKFAST, Disp: 90 tablet, Rfl: 3   metolazone  (ZAROXOLYN ) 2.5 MG tablet, TAKE 1 TABLET BY MOUTH ONCE A WEEK, Disp: 4 tablet, Rfl: 1   midodrine  (PROAMATINE ) 5 MG tablet, Take 1 tablet (5 mg total) by mouth 3 (three) times daily., Disp: 200 tablet, Rfl: 3   Multiple Vitamin (MULTIVITAMIN WITH MINERALS) TABS tablet, Take 1 tablet by mouth daily., Disp: , Rfl:    phenol (CHLORASEPTIC) 1.4 % LIQD, Use as directed 1 spray in the mouth or throat as needed for throat irritation / pain., Disp: , Rfl:    potassium chloride  SA (KLOR-CON   M) 20 MEQ tablet, Take 3 tablets (60 mEq total) by mouth 2 (two) times daily., Disp: 572 tablet, Rfl: 3   risperiDONE  (RISPERDAL ) 0.5 MG tablet, Take 1 tablet (0.5 mg total) by mouth 2 (two) times daily at 8 am and 4 pm., Disp: 60 tablet, Rfl: 5   simvastatin  (ZOCOR ) 10 MG tablet, Take 1 tablet (10 mg total) by mouth every evening., Disp: 90 tablet, Rfl: 3   spironolactone  (ALDACTONE ) 25 MG tablet, TAKE 1 TABLET (25 MG TOTAL) BY MOUTH EVERY EVENING., Disp: 90 tablet, Rfl: 3   tirzepatide  (MOUNJARO ) 12.5 MG/0.5ML Pen, INJECT 12.5 MG INTO THE SKIN ONCE A WEEK., Disp: 2 mL, Rfl: 3   torsemide  (DEMADEX ) 100 MG tablet, Take 1 tablet (100 mg total) by mouth 2 (two) times daily., Disp: 180 tablet, Rfl: 3   amoxicillin  (AMOXIL ) 875 MG tablet, TAKE 1 TABLET BY MOUTH TWICE A DAY 7 DAYS (Patient not taking: Reported on 03/29/2024), Disp: , Rfl:    Capsaicin 0.1 % CREA, 1 application as needed Externally Three times a day as needed, Disp: , Rfl:    COMIRNATY syringe, , Disp: , Rfl:    gabapentin  (NEURONTIN ) 400 MG capsule, Take 1 capsule (400 mg total) by mouth daily in the afternoon for 3 days. Continue the prior prescription of  100 mg twice a day in addition to the 400 mg once daily in the afternoon for a total of 600 mg daily which is the maximum allowed due to chronic kidney condition (Patient not taking: Reported on 03/29/2024), Disp: 3 capsule, Rfl: 0   memantine  (NAMENDA ) 5 MG tablet, Take 5 mg by mouth 2 (two) times daily. (Patient taking differently: Take 10 mg by mouth 2 (two) times daily.), Disp: , Rfl:    nicotine polacrilex (COMMIT) 2 MG lozenge, as directed Mouth/Throat every 4 hours (Patient not taking: Reported on 03/29/2024), Disp: , Rfl:    nystatin (MYCOSTATIN) 100000 UNIT/ML suspension, 4 mL swish and swallow Mouth/Throat Four times a day; Duration: 14 days (Patient not taking: Reported on 03/29/2024), Disp: , Rfl:  Allergies  Allergen Reactions   Ceftin Anaphylaxis    Face and throat swell    Cefuroxime Axetil Anaphylaxis    Face and throat swell   Geodon [Ziprasidone Hcl] Hives   Lisinopril  Other (See Comments)    angioedema  lisinopril   Other Reaction(s): angioedema   Shellfish Allergy Other (See Comments)    Gout exacerbation  shellfish derived   Shellfish Protein-Containing Drug Products Other (See Comments)    Other reaction(s): Other  shellfish derived   Cefuroxime     Other reaction(s): anaphylaxis  Other Reaction(s): Unknown   Sulfacetamide Sodium-Sulfur      Other reaction(s): itch   Ziprasidone     Other reaction(s): hives on left arm  Other Reaction(s): hives on left arm   Allopurinol  Nausea Only and Other (See Comments)    weakness  Other Reaction(s): nausea, weakness   Diazepam  Other (See Comments)    Patient states that diazepam  doesn't relax, it has the opposite effect.  Valium    Lorazepam  Itching   Sulfa Antibiotics Itching     Social History   Socioeconomic History   Marital status: Married    Spouse name: Dallas   Number of children: 2   Years of education: 18   Highest education level: Master's degree (e.g., MA, MS, MEng, MEd, MSW, MBA)  Occupational  History   Occupation: retired  Tobacco Use   Smoking status: Every Day    Current  packs/day: 0.00    Average packs/day: 0.5 packs/day for 26.0 years (13.0 ttl pk-yrs)    Types: Cigarettes    Start date: 03/24/1995    Last attempt to quit: 03/23/2021    Years since quitting: 3.0   Smokeless tobacco: Never  Vaping Use   Vaping status: Never Used  Substance and Sexual Activity   Alcohol use: No   Drug use: No   Sexual activity: Yes  Other Topics Concern   Not on file  Social History Narrative   Tobacco Use Cigarettes: Former Smoker, Quit in 2008   No Alcohol   No recreational drug use   Diet: Regular/Low Carb   Exercise: None   Occupation: disabled   Education: Company Secretary, masters   Children: 2   Firearms: No   Risk Analyst Use: Always   Former wellsite geologist.    Right handed   Two story home   Lives with spouse Dallas      Social Drivers of Health   Financial Resource Strain: Low Risk  (09/17/2023)   Overall Financial Resource Strain (CARDIA)    Difficulty of Paying Living Expenses: Not very hard  Food Insecurity: No Food Insecurity (03/06/2024)   Hunger Vital Sign    Worried About Running Out of Food in the Last Year: Never true    Ran Out of Food in the Last Year: Never true  Transportation Needs: No Transportation Needs (03/06/2024)   PRAPARE - Administrator, Civil Service (Medical): No    Lack of Transportation (Non-Medical): No  Physical Activity: Insufficiently Active (09/17/2023)   Exercise Vital Sign    Days of Exercise per Week: 1 day    Minutes of Exercise per Session: 20 min  Stress: Stress Concern Present (09/17/2023)   [name] of Occupational Health - Occupational Stress Questionnaire    Feeling of Stress : To some extent  Social Connections: Socially Integrated (09/17/2023)   Social Connection and Isolation Panel    Frequency of Communication with Friends and Family: Twice a week    Frequency of Social Gatherings with Friends and  Family: Twice a week    Attends Religious Services: 1 to 4 times per year    Active Member of Clubs or Organizations: Patient unable to answer    Attends Banker Meetings: 1 to 4 times per year    Marital Status: Married  Catering Manager Violence: Not At Risk (03/06/2024)   Humiliation, Afraid, Rape, and Kick questionnaire    Fear of Current or Ex-Partner: No    Emotionally Abused: No    Physically Abused: No    Sexually Abused: No    Physical Exam      Future Appointments  Date Time Provider Department Center  04/21/2024  9:30 AM Slusher, Santana LABOR, RN [facility] None  04/26/2024  8:50 AM Tobie Breslow K, DO [facility] None  04/27/2024  1:45 PM Gaynel Delon CROME, DPM [facility]  05/03/2024  3:00 PM [facility] PA/NP [facility] None  05/10/2024  7:00 AM [facility] H[facility] DEVICE REMOTES [facility]  06/12/2024  7:10 AM [facility] H[facility] DEVICE REMOTES [facility]  09/07/2024  3:20 PM Arfeen, Leni DASEN, MD [facility] None  09/11/2024  7:10 AM [facility] DEVICE REMOTES [facility]  12/11/2024  7:10 AM [facility] DEVICE REMOTES [facility]  03/12/2025  7:10 AM [facility] H[facility] DEVICE REMOTES [facility]

## 2024-04-13 ENCOUNTER — Telehealth: Payer: Self-pay

## 2024-04-13 NOTE — Patient Instructions (Signed)
 Visit Information  Thank you for taking time to visit with me today. Please don't hesitate to contact me if I can be of assistance to you before our next scheduled appointment.  Your next care management appointment is no further scheduled appointments.   Patient has met all care management goals. Care Management case will be closed. Patient has been provided contact information should new needs arise.   Please call the care guide team at [phone] if you need to cancel, schedule, or reschedule an appointment.   Please call the [place]  National Suicide Prevention Lifeline: 650 389 3178 or TTY: [phone] TTY [phone]) to talk to a trained counselor if you are experiencing a Mental Health or Behavioral Health Crisis or need someone to talk to.  Santana Stamp BSN, CCM [place]  [facility]BC[facility] Population Health RN Care Manager Direct Dial: [phone]  Fax: [phone]

## 2024-04-13 NOTE — Patient Outreach (Signed)
 Complex Care Management   Visit Note  04/13/2024  Name:  Rhonda [name] MRN: [id number] DOB: 05-27-1950  Situation: Referral received for Complex Care Management related to Fall Risk. I obtained verbal consent from Caregiver/Husband/DPR  Visit completed with Rhonda [name]  on the phone  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/2013   Body mass index (BMI) 45.0-49.9, adult 06/11/2020   Bowel incontinence 06/11/2020   Cardiomyopathy    Cholelithiasis 06/11/2020   Chronic respiratory failure 09/14/2013   Chronic sinusitis 06/11/2020   Chronic systolic heart failure 10/27/2012   a) NICM b) ECHO (03/2013) EF 20-25% c) ECHO (09/2013) EF 45-50%, grade I DD   CKD (chronic kidney disease) stage 3, GFR 30-59 ml/min 11/08/2018   Cleft palate 06/11/2020   Complication of anesthesia    History of low blood pressure after surgery; attributed to lying flat   Daytime somnolence 06/11/2020   Decreased estrogen level 08/01/2021   Diabetic neuropathy    Diarrhea of presumed infectious origin 06/11/2020   Edema 06/11/2020   Elevated troponin 12/02/2021   Endometrial polyp 01/20/2012   Exertional shortness of breath    Generalized anxiety disorder    GERD (gastroesophageal reflux disease)    Gout 06/11/2020   Heart murmur    History of colonic polyps 06/11/2020   History of fall 06/11/2020   Hyperlipidemia    Hyperparathyroidism 08/01/2021   Hypertension    Hypokalemia 11/08/2018   Hyponatremia 10/08/2021   Hypothyroidism    Insomnia 06/11/2020   Intractable nausea and  vomiting 10/08/2021   Irritable bowel syndrome 01/04/2020   Left bundle branch block    s/p CRT-D (04/2013)   Lumbago without sciatica 04/30/2021   Lumbar spondylosis with myelopathy 06/11/2020   Macrocytosis 06/11/2020   Major depressive disorder    Malignant tumor of breast 03/26/2011   Left; completed chemotherapy and radiation treatments   Migraines    Mild dementia 04/20/2023   On home oxygen  therapy    2L suppose to be q night (05/03/2013)   Orthostatic hypotension 07/28/2017   Spinal stenosis of lumbar region 01/03/2020   Spondylolisthesis 08/01/2021   SVD (spontaneous vaginal delivery)    x 2   Type II diabetes mellitus 06/15/2019   Varicose veins of bilateral lower extremities with other complications 08/01/2021   Vitamin D  deficiency 03/26/2011    Assessment: Patient Reported Symptoms:  Cognitive Cognitive Status: Able to follow simple commands, Requires Assistance Decision Making, Struggling with memory recall      Neurological Neurological Review of Symptoms: No symptoms reported    HEENT HEENT Symptoms Reported: Not assessed      Cardiovascular Other Cardiovascular Symptoms: Swelling in ankles at baseline. [name]/Paramedic Cardiac Program checks fluid status weekly. Does patient have uncontrolled Hypertension?: No Cardiovascular Self-Management Outcome: 4 (good)  Respiratory Respiratory Symptoms Reported: No symptoms reported    Endocrine Endocrine Symptoms Reported: Not assessed    Gastrointestinal Gastrointestinal Symptoms Reported: No symptoms reported      Genitourinary Genitourinary Symptoms Reported: Incontinence    Integumentary Integumentary Symptoms Reported: No symptoms reported  Musculoskeletal Musculoskelatal Symptoms Reviewed: Unsteady gait Additional Musculoskeletal Details: Uses cane, denies falls        Psychosocial Psychosocial Symptoms Reported: No symptoms reported          04/13/2024    PHQ2-9 Depression Screening    Little interest or pleasure in doing things    Feeling down, depressed, or hopeless    PHQ-2 - Total Score    Trouble falling or staying asleep, or sleeping too much    Feeling tired or having little energy    Poor appetite or overeating     Feeling bad about yourself - or that you are a failure or have let yourself or your family down    Trouble concentrating on things, such as reading the newspaper or watching television    Moving or speaking so slowly that other people could have noticed.  Or the opposite - being so fidgety or restless that you have been moving around a lot more than usual    Thoughts that you would be better off dead, or hurting yourself in some way    PHQ2-9 Total Score    If you checked off any problems, how difficult have these problems made it for you to do your work, take care of things at home, or get along with other people    Depression Interventions/Treatment      There were no vitals filed for this visit.    Medications Reviewed Today   Medications were not reviewed in this encounter     Recommendation:   Continue Current Plan of Care  Follow Up Plan:   Patient has met all care management goals. Care Management case will be closed. Patient has been provided contact information should new needs arise.   Santana Stamp BSN, CCM [place]  VBC[facility] Population Health RN Care Manager Direct Dial: [phone]  Fax: [phone]

## 2024-04-19 ENCOUNTER — Ambulatory Visit: Admitting: Podiatry

## 2024-04-21 ENCOUNTER — Other Ambulatory Visit: Payer: Self-pay

## 2024-04-21 NOTE — Patient Outreach (Signed)
 Returning incoming call from husband/DPR, no questions or concerns today.

## 2024-04-26 ENCOUNTER — Other Ambulatory Visit (HOSPITAL_COMMUNITY): Payer: Self-pay

## 2024-04-26 ENCOUNTER — Encounter: Payer: Self-pay | Admitting: Neurology

## 2024-04-26 ENCOUNTER — Ambulatory Visit: Admitting: Neurology

## 2024-04-26 VITALS — BP 109/72 | HR 89 | Ht 62.5 in | Wt 198.0 lb

## 2024-04-26 DIAGNOSIS — F0390 Unspecified dementia without behavioral disturbance: Secondary | ICD-10-CM

## 2024-04-26 DIAGNOSIS — E1140 Type 2 diabetes mellitus with diabetic neuropathy, unspecified: Secondary | ICD-10-CM

## 2024-04-26 MED ORDER — GABAPENTIN 100 MG PO CAPS: 100.0000 mg | ORAL_CAPSULE | Freq: Two times a day (BID) | ORAL | 3 refills | Status: AC

## 2024-04-26 NOTE — Progress Notes (Signed)
 Follow-up Visit   Date: 04/26/24    Rhonda [name] MRN: [id number] DOB: [birth year]   Interim History: Rhonda [name] is a 73 y.o.  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  The patient was accompanied to the clinic by husband who also provides collateral information.    History of present illness: She reports having falls since November 2017, some which occurred by tripping over objects and loosing her balance.  Falls become much worse during 2018.  She often gets lightheaded preceding her falls and nearly always falls backwards.  Many of her falls were occurring in the kitchen while standing as she was preparing dinner.  She noticed that prolonged standing for 5 to 10 minutes always triggered her falls and was preceded by lightheadedness.  She was found to be orthostatic and started on midodrine  10 mg 3 times daily.   She has been diabetic for at least 10 years and has painful neuropathy of the feet, which is well-controlled on gabapentin  300mg  BID.  She takes extra gabapentin  800mg  as needed for severe pain, about once per week.    Over the past several years, she has noticed problems with short-term memory and family get frustrated with her because she is "slow".  She is able to cook and do laundry.  She manages her own medications and drives, but sometimes feels uncomfortable when driving. On one occasion, she forgot how to put her cars into her ignition or to start the wipers.  She is driving and has not been involved in any MVAs.  Her family complained that she is repeating herself often and sometimes she talks nonsensically.  She manages finances with her husband.  Prior work-up has included: CT cervical spine, CT brain.   Imaging showed increased sclerosis of the calvarium and vertebral bodies.  Her PTH was elevated and she is seeing endocrinology.  Findings also showed possible intracranial hypertension and follow-up LP showed mildly raised ICP at 26mm H20.  There has been no change in her gait or falls following the spinal tap.  She was started on Diamox  125mg  daily.  For memory issues, she underwent neurocognitive testing, however, due to patient being very tired/sleepy, she was unable to engage in the testing, limiting formal diagnosis. However, there was evidence of severe depression and anxiety, which patient endorses.      UPDATE 06/29/2023:  She is here for follow-up visit.  She had neuropsychological testing which shows significant cognitive impairment consistent with dementia. Her mental health is doing better after switching psychiatrists.  She is seeing Dr. Arfeen now.  She is able to bathe, dress, and feed herself.  She does microwave meals, but sometimes forgets to eat.  Medications are prepared weekly by paramedic and husband ensure that she takes them.  Husband works for [employer] from 11:30a - 9pm.  They have a caregiver that comes twice per week in the afternoon.   UPDATE 04/26/2024: Discussed the use of AI scribe software for clinical note transcription with the patient, who gave verbal consent to proceed.  History of Present Illness Rhonda [name] is a 73 year old female with dementia who presents for a follow-up visit. She is accompanied by her husband.  She has been experiencing progressive short-term memory loss, requiring frequent reminders for daily activities.  Her husband notes that she sometimes forgets significant recent events, such as her daughter moving back home a month ago. She is currently on memantine  5 mg twice daily.  She also receives care twice a week and a paramedic visit once a week to assist with medication management.  She is on gabapentin  100 mg twice daily for neuropathy. In  September, she missed doses and experienced a neuropathy attack, which was managed with a temporary increase in dosage. Her husband ensures she takes her medications, which are organized in pill boxes.  There are no new vision changes, but she has not had an eye exam this year due to needing a new eye doctor who accepts her insurance.  Her daughter, who is bipolar and on disability, has moved back home to help with her care. The daughter works part-time with a sports coach to maintain her benefits.  No recent falls. Headaches are better. No new vision changes.  Medications:  Current Outpatient Medications on File Prior to Visit  Medication Sig Dispense Refill   acetaminophen  (TYLENOL ) 500 MG tablet Take 500 mg by mouth every 6 (six) hours as needed.     acetaZOLAMIDE  (DIAMOX ) 125 MG tablet Take 1 tablet (125 mg total) by mouth daily. 90 tablet 3   albuterol  (VENTOLIN  HFA) 108 (90 Base) MCG/ACT inhaler INHALE TWO PUFFS BY MOUTH EVERY 4 HOURS AS NEEDED     allopurinol  (ZYLOPRIM ) 100 MG tablet Take 0.5 tablets (50 mg total) by mouth daily. 45 tablet 2   amoxicillin  (AMOXIL ) 875 MG tablet TAKE 1 TABLET BY MOUTH TWICE A DAY 7 DAYS     azelastine  (ASTELIN ) 0.1 % nasal spray Place 2 sprays into both nostrils 2 (two) times daily. Use in each nostril as directed 30 mL 12   buPROPion  (WELLBUTRIN  XL) 150 MG 24 hr tablet Take 1 tablet (150 mg total) by mouth daily. 30 tablet 5   Capsaicin 0.1 % CREA 1 application as needed Externally Three times a day as needed     carvedilol  (COREG ) 3.125 MG tablet TAKE 1 TABLET (3.125 MG) BY MOUTH TWICE DAILY WITH MEALS 180 tablet 3   citalopram  (CELEXA ) 20 MG tablet Take 1 tablet (20 mg total) by mouth daily. 30 tablet 5   clonazePAM  (KLONOPIN ) 0.5 MG tablet Take 1 tablet (0.5 mg total) by mouth at bedtime. 30 tablet 5   cyanocobalamin  (VITAMIN B12) 1000 MCG tablet Take 1,000 mcg by mouth daily.     desloratadine  (CLARINEX ) 5 MG tablet TAKE 1 TABLET (5 MG TOTAL) BY  MOUTH DAILY FOR ALLERGIES 90 tablet 3   dicyclomine  (BENTYL ) 20 MG tablet TAKE 1 TABLET BY MOUTH THREE TIMES DAILY 30 MINUTES BEFORE EATING     famotidine (PEPCID) 20 MG tablet Take 20 mg by mouth as needed for heartburn or indigestion.     ferrous sulfate  325 (65 FE) MG tablet 1 tablet Orally Once a day     fluticasone  (FLONASE ) 50 MCG/ACT nasal spray Place 1 spray into both nostrils daily.     Furosemide  (FUROSCIX ) 80 MG/10ML CTKT Inject 80 mg into the skin daily as needed. As directed by the heart failure clinic 5 each 1   gabapentin  (NEURONTIN ) 100 MG capsule TAKE 1 CAPSULE (100 MG TOTAL) BY MOUTH 2 (TWO) TIMES DAILY. 180 capsule 0   ipratropium (ATROVENT ) 0.03 % nasal spray      levothyroxine  (SYNTHROID ) 88 MCG tablet TAKE 1 TABLET(88 MCG) BY MOUTH DAILY BEFORE BREAKFAST 90 tablet 3   memantine  (NAMENDA )  5 MG tablet Take 5 mg by mouth 2 (two) times daily.     metolazone  (ZAROXOLYN ) 2.5 MG tablet TAKE 1 TABLET BY MOUTH ONCE A WEEK 4 tablet 1   midodrine  (PROAMATINE ) 5 MG tablet Take 1 tablet (5 mg total) by mouth 3 (three) times daily. 200 tablet 3   Multiple Vitamin (MULTIVITAMIN WITH MINERALS) TABS tablet Take 1 tablet by mouth daily.     phenol (CHLORASEPTIC) 1.4 % LIQD Use as directed 1 spray in the mouth or throat as needed for throat irritation / pain.     potassium chloride  SA (KLOR-CON  M) 20 MEQ tablet Take 3 tablets (60 mEq total) by mouth 2 (two) times daily. 572 tablet 3   risperiDONE  (RISPERDAL ) 0.5 MG tablet Take 1 tablet (0.5 mg total) by mouth 2 (two) times daily at 8 am and 4 pm. 60 tablet 5   simvastatin  (ZOCOR ) 10 MG tablet Take 1 tablet (10 mg total) by mouth every evening. 90 tablet 3   spironolactone  (ALDACTONE ) 25 MG tablet TAKE 1 TABLET (25 MG TOTAL) BY MOUTH EVERY EVENING. 90 tablet 3   tirzepatide  (MOUNJARO ) 12.5 MG/0.5ML Pen INJECT 12.5 MG INTO THE SKIN ONCE A WEEK. 2 mL 3   torsemide  (DEMADEX ) 100 MG tablet Take 1 tablet (100 mg total) by mouth 2 (two) times daily.  180 tablet 3   COMIRNATY syringe  (Patient not taking: Reported on 04/26/2024)     gabapentin  (NEURONTIN ) 400 MG capsule Take 1 capsule (400 mg total) by mouth daily in the afternoon for 3 days. Continue the prior prescription of 100 mg twice a day in addition to the 400 mg once daily in the afternoon for a total of 600 mg daily which is the maximum allowed due to chronic kidney condition (Patient not taking: Reported on 04/26/2024) 3 capsule 0   nicotine polacrilex (COMMIT) 2 MG lozenge as directed Mouth/Throat every 4 hours (Patient not taking: Reported on 04/26/2024)     nystatin (MYCOSTATIN) 100000 UNIT/ML suspension 4 mL swish and swallow Mouth/Throat Four times a day; Duration: 14 days (Patient not taking: Reported on 04/26/2024)     Potassium Chloride  ER 20 MEQ TBCR 2 tablets with food Orally three times a day (Patient not taking: Reported on 04/26/2024)     No current facility-administered medications on file prior to visit.    Allergies:  Allergies  Allergen Reactions   Ceftin Anaphylaxis    Face and throat swell    Cefuroxime Axetil Anaphylaxis    Face and throat swell   Geodon [Ziprasidone Hcl] Hives   Lisinopril  Other (See Comments)    angioedema  lisinopril   Other Reaction(s): angioedema   Shellfish Allergy Other (See Comments)    Gout exacerbation  shellfish derived   Shellfish Protein-Containing Drug Products Other (See Comments)    Other reaction(s): Other  shellfish derived   Cefuroxime     Other reaction(s): anaphylaxis  Other Reaction(s): Unknown   Sulfacetamide Sodium-Sulfur      Other reaction(s): itch   Ziprasidone     Other reaction(s): hives on left arm  Other Reaction(s): hives on left arm   Allopurinol  Nausea Only and Other (See Comments)    weakness  Other Reaction(s): nausea, weakness   Diazepam  Other (See Comments)    Patient states that diazepam  doesn't relax, it has the opposite effect.  Valium    Lorazepam  Itching   Sulfa Antibiotics Itching     Vital Signs:  BP 109/72   Pulse 89   Ht 5' 2.5 (  1.588 m)   Wt 198 lb (89.8 kg)   SpO2 97%   BMI 35.64 kg/m   No data found.  Neurological Exam: MENTAL STATUS including orientation to time, place, person, recent and remote memory is attention span and concentration, language, and fund of knowledge is fair.  Follows commands.  She is able to correctly identify self, husband, home address, DOB, and cell number.   Speech is slow and dysarthric (baseline).  CRANIAL NERVES:  Pupils are round and reactive. Extraocular muscles are intact throughout, except mild restriction with upgaze.  No ptosis.   MOTOR:  Motor strength is 5/5 in all extremities. No pronator drift.  Tone is normal.  No tremor.   REFLEXES:  Reflexes are 2+/4 throughout except 3+/4 at the knees bilaterally.   COORDINATION/GAIT:  Gait is slightly-wide based, unassisted.   Data: Labs 07/31/2016:  Vitamin B1 11, vitamin B12 597, copper  162, SPEP with IFE no M protein  CT head wo contrast performed at [facility] 11/24/2017: No acute intracranial abnormality appreciated.  Expanded empty sella may reflect idiopathic intracranial hypertension.  Thick dense calvarium which is of uncertain clinical significance.  Boney sclerosis can be seen with, but not limited to, hematological conditions such as myelofibrosis, metabolic bone disorders such as hyperthyroidism and hyperparathyroidism and other causes.  Correlate clinically.   CT cervical spine wo contrast 11/24/2017: 1.  No acute fracture 2.  No significant spinal canal or foraminal stenosis 3.  Increased sclerosis of the bony structures.  Boney sclerosis can be seen with, but not limited to, hematological conditions such as myelofibrosis, metabolic bone disorders such as hyperthyroidism and hyperparathyroidism and other causes.  Correlate clinically.   Neuropsychological testing 03/15/2019:  severe symptoms of both depression and anxiety.   IMPRESSION/PLAN: 1.  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    - Continue memantine  5mg  twice daily  - Husband oversees medication, finances, and decision-making  2.  Intracranial hypertension OP 26cm H2O, stable, headaches are improved.  - Continue Diamox  125mg  daily  3.  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  Stable - Continue gabapentin  100mg  twice daily - renally dosed  4.  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    - Previously seen Dr. Debby, she is not interested in surgery  Return to clinic in 1 year  Total time spent reviewing records, interview, history/exam, documentation, and coordination of care on day of encounter:  30 minutes  Thank you for allowing me to participate in patient's care.  If I can answer any additional questions, I would be pleased to do so.    Sincerely,

## 2024-04-26 NOTE — Progress Notes (Signed)
 Paramedicine Encounter    Patient ID: Rhonda [name] Potters, female    DOB: 08-19-1950, 73 y.o.   MRN: [id number]  Rhonda [name] out for a med rec only today as she was seen by [facility] this morning. I will plan to complete a full visit next week. Pill box filled for one week refills will be called into [facility]. Visit complete.    [name], EMT-Paramedic [phone] 04/26/2024     Patient Care Team: Chrystal Lamarr GORMAN, MD as PCP - General (Family Medicine) Waddell Danelle ORN, MD as PCP - Electrophysiology (Cardiology) Cathern Andriette DEL, LCSW as Social Worker (Licensed Clinical Social Worker) Tobie, Tonita POUR, DO as Consulting Physician (Neurology) Mirza Powell, Paramedic as Paramedic Chrystal, Lamarr GORMAN, MD as Attending Physician (Family Medicine) Bensimhon, Toribio SAUNDERS, MD as Consulting Physician (Cardiology) Curry Leni DASEN, MD as Consulting Physician (Psychiatry) Tobie Franky SQUIBB, DPM as Consulting Physician (Podiatry) Aniceto Daphne CROME, NP as Nurse Practitioner (Cardiology) Gaynel Delon CROME, DPM as Consulting Physician (Podiatry)  Patient Active Problem List   Diagnosis Date Noted   Mild dementia 04/20/2023   Diabetic neuropathy    Elevated troponin 12/02/2021   Acute left ankle pain 10/11/2021   Hyponatremia 10/08/2021   Decreased estrogen level 08/01/2021   Hyperparathyroidism 08/01/2021   Spondylolisthesis 08/01/2021   Varicose veins of bilateral lower extremities with other complications 08/01/2021   Lumbago without sciatica 04/30/2021   Abnormal gait 06/11/2020   Allergic rhinitis 06/11/2020   Asthma 06/11/2020   Benign intracranial hypertension 06/11/2020   Body mass index (BMI) 45.0-49.9, adult 06/11/2020   Bowel incontinence 06/11/2020   Cholelithiasis 06/11/2020   Chronic sinusitis 06/11/2020   Cleft palate 06/11/2020   Daytime somnolence 06/11/2020   Edema 06/11/2020   Family history of malignant neoplasm of gastrointestinal tract 06/11/2020   Gout  06/11/2020   Insomnia 06/11/2020   Atrophic gastritis 06/11/2020   Lumbar spondylosis with myelopathy 06/11/2020   Macrocytosis 06/11/2020   History of colonic polyps 06/11/2020   Repeated falls 06/11/2020   Irritable bowel syndrome 01/04/2020   Spinal stenosis of lumbar region 01/03/2020   Bilateral leg weakness 08/01/2019   Type II diabetes mellitus 06/15/2019   Hypokalemia 11/08/2018   CKD (chronic kidney disease) stage 3, GFR 30-59 ml/min 11/08/2018   Orthostatic hypotension 07/28/2017   On home oxygen  therapy    Migraines    Left bundle branch block    Hypothyroidism    Hypertension    Hyperlipidemia    Heart murmur    GERD (gastroesophageal reflux disease)    Exertional shortness of breath    Major depressive disorder    Back pain    Arthritis    Generalized anxiety disorder    Anemia    Chronic respiratory failure 09/14/2013   Biventricular ICD (implantable cardioverter-defibrillator) in place 08/04/2013   Chronic systolic heart failure 10/27/2012   Endometrial polyp 01/20/2012   Malignant tumor of breast 03/26/2011   Vitamin D  deficiency 03/26/2011    Current Outpatient Medications:    acetaminophen  (TYLENOL ) 500 MG tablet, Take 500 mg by mouth every 6 (six) hours as needed., Disp: , Rfl:    acetaZOLAMIDE  (DIAMOX ) 125 MG tablet, Take 1 tablet (125 mg total) by mouth daily., Disp: 90 tablet, Rfl: 3   albuterol  (VENTOLIN  HFA) 108 (90 Base) MCG/ACT inhaler, INHALE TWO PUFFS BY MOUTH EVERY 4 HOURS AS NEEDED, Disp: , Rfl:    allopurinol  (ZYLOPRIM ) 100 MG tablet, Take 0.5 tablets (50 mg total) by mouth daily., Disp: 45 tablet,  Rfl: 2   amoxicillin  (AMOXIL ) 875 MG tablet, TAKE 1 TABLET BY MOUTH TWICE A DAY 7 DAYS, Disp: , Rfl:    azelastine  (ASTELIN ) 0.1 % nasal spray, Place 2 sprays into both nostrils 2 (two) times daily. Use in each nostril as directed, Disp: 30 mL, Rfl: 12   buPROPion  (WELLBUTRIN  XL) 150 MG 24 hr tablet, Take 1 tablet (150 mg total) by mouth daily.,  Disp: 30 tablet, Rfl: 5   Capsaicin 0.1 % CREA, 1 application as needed Externally Three times a day as needed, Disp: , Rfl:    carvedilol  (COREG ) 3.125 MG tablet, TAKE 1 TABLET (3.125 MG) BY MOUTH TWICE DAILY WITH MEALS, Disp: 180 tablet, Rfl: 3   citalopram  (CELEXA ) 20 MG tablet, Take 1 tablet (20 mg total) by mouth daily., Disp: 30 tablet, Rfl: 5   clonazePAM  (KLONOPIN ) 0.5 MG tablet, Take 1 tablet (0.5 mg total) by mouth at bedtime., Disp: 30 tablet, Rfl: 5   COMIRNATY syringe, , Disp: , Rfl:    cyanocobalamin  (VITAMIN B12) 1000 MCG tablet, Take 1,000 mcg by mouth daily., Disp: , Rfl:    desloratadine  (CLARINEX ) 5 MG tablet, TAKE 1 TABLET (5 MG TOTAL) BY MOUTH DAILY FOR ALLERGIES, Disp: 90 tablet, Rfl: 3   dicyclomine  (BENTYL ) 20 MG tablet, TAKE 1 TABLET BY MOUTH THREE TIMES DAILY 30 MINUTES BEFORE EATING, Disp: , Rfl:    famotidine (PEPCID) 20 MG tablet, Take 20 mg by mouth as needed for heartburn or indigestion., Disp: , Rfl:    ferrous sulfate  325 (65 FE) MG tablet, 1 tablet Orally Once a day, Disp: , Rfl:    fluticasone  (FLONASE ) 50 MCG/ACT nasal spray, Place 1 spray into both nostrils daily., Disp: , Rfl:    Furosemide  (FUROSCIX ) 80 MG/10ML CTKT, Inject 80 mg into the skin daily as needed. As directed by the heart failure clinic, Disp: 5 each, Rfl: 1   gabapentin  (NEURONTIN ) 100 MG capsule, Take 1 capsule (100 mg total) by mouth 2 (two) times daily., Disp: 180 capsule, Rfl: 3   ipratropium (ATROVENT ) 0.03 % nasal spray, , Disp: , Rfl:    levothyroxine  (SYNTHROID ) 88 MCG tablet, TAKE 1 TABLET(88 MCG) BY MOUTH DAILY BEFORE BREAKFAST, Disp: 90 tablet, Rfl: 3   memantine  (NAMENDA ) 5 MG tablet, Take 5 mg by mouth 2 (two) times daily., Disp: , Rfl:    metolazone  (ZAROXOLYN ) 2.5 MG tablet, TAKE 1 TABLET BY MOUTH ONCE A WEEK, Disp: 4 tablet, Rfl: 1   midodrine  (PROAMATINE ) 5 MG tablet, Take 1 tablet (5 mg total) by mouth 3 (three) times daily., Disp: 200 tablet, Rfl: 3   Multiple Vitamin  (MULTIVITAMIN WITH MINERALS) TABS tablet, Take 1 tablet by mouth daily., Disp: , Rfl:    nicotine polacrilex (COMMIT) 2 MG lozenge, as directed Mouth/Throat every 4 hours (Patient not taking: Reported on 04/26/2024), Disp: , Rfl:    nystatin (MYCOSTATIN) 100000 UNIT/ML suspension, 4 mL swish and swallow Mouth/Throat Four times a day; Duration: 14 days (Patient not taking: Reported on 04/26/2024), Disp: , Rfl:    phenol (CHLORASEPTIC) 1.4 % LIQD, Use as directed 1 spray in the mouth or throat as needed for throat irritation / pain., Disp: , Rfl:    Potassium Chloride  ER 20 MEQ TBCR, 2 tablets with food Orally three times a day (Patient not taking: Reported on 04/26/2024), Disp: , Rfl:    potassium chloride  SA (KLOR-CON  M) 20 MEQ tablet, Take 3 tablets (60 mEq total) by mouth 2 (two) times daily., Disp: 572  tablet, Rfl: 3   risperiDONE  (RISPERDAL ) 0.5 MG tablet, Take 1 tablet (0.5 mg total) by mouth 2 (two) times daily at 8 am and 4 pm., Disp: 60 tablet, Rfl: 5   simvastatin  (ZOCOR ) 10 MG tablet, Take 1 tablet (10 mg total) by mouth every evening., Disp: 90 tablet, Rfl: 3   spironolactone  (ALDACTONE ) 25 MG tablet, TAKE 1 TABLET (25 MG TOTAL) BY MOUTH EVERY EVENING., Disp: 90 tablet, Rfl: 3   tirzepatide  (MOUNJARO ) 12.5 MG/0.5ML Pen, INJECT 12.5 MG INTO THE SKIN ONCE A WEEK., Disp: 2 mL, Rfl: 3   torsemide  (DEMADEX ) 100 MG tablet, Take 1 tablet (100 mg total) by mouth 2 (two) times daily., Disp: 180 tablet, Rfl: 3 Allergies  Allergen Reactions   Ceftin Anaphylaxis    Face and throat swell    Cefuroxime Axetil Anaphylaxis    Face and throat swell   Geodon [Ziprasidone Hcl] Hives   Lisinopril  Other (See Comments)    angioedema  lisinopril   Other Reaction(s): angioedema   Shellfish Allergy Other (See Comments)    Gout exacerbation  shellfish derived   Shellfish Protein-Containing Drug Products Other (See Comments)    Other reaction(s): Other  shellfish derived   Cefuroxime     Other  reaction(s): anaphylaxis  Other Reaction(s): Unknown   Sulfacetamide Sodium-Sulfur      Other reaction(s): itch   Ziprasidone     Other reaction(s): hives on left arm  Other Reaction(s): hives on left arm   Allopurinol  Nausea Only and Other (See Comments)    weakness  Other Reaction(s): nausea, weakness   Diazepam  Other (See Comments)    Patient states that diazepam  doesn't relax, it has the opposite effect.  Valium    Lorazepam  Itching   Sulfa Antibiotics Itching     Social History   Socioeconomic History   Marital status: Married    Spouse name: Rhonda [name]   Number of children: 2   Years of education: 18   Highest education level: Master's degree (e.g., MA, MS, MEng, MEd, MSW, MBA)  Occupational History   Occupation: retired  Tobacco Use   Smoking status: Every Day    Current packs/day: 0.00    Average packs/day: 0.5 packs/day for 26.0 years (13.0 ttl pk-yrs)    Types: Cigarettes    Start date: 03/24/1995    Last attempt to quit: 03/23/2021    Years since quitting: 3.0   Smokeless tobacco: Never  Vaping Use   Vaping status: Never Used  Substance and Sexual Activity   Alcohol use: No   Drug use: No   Sexual activity: Yes  Other Topics Concern   Not on file  Social History Narrative   Tobacco Use Cigarettes: Former Smoker, Quit in 2008   No Alcohol   No recreational drug use   Diet: Regular/Low Carb   Exercise: None   Occupation: disabled   Education: Company Secretary, masters   Children: 2   Firearms: No   Risk Analyst Use: Always   Former wellsite geologist.    Right handed   Two story home   Lives with spouse Rhonda [name]      Social Drivers of Health   Financial Resource Strain: Low Risk  (09/17/2023)   Overall Financial Resource Strain (CARDIA)    Difficulty of Paying Living Expenses: Not very hard  Food Insecurity: No Food Insecurity (03/06/2024)   Hunger Vital Sign    Worried About Running Out of Food in the Last Year: Never true    Ran Out of Food in  the Last  Year: Never true  Transportation Needs: No Transportation Needs (03/06/2024)   PRAPARE - Administrator, Civil Service (Medical): No    Lack of Transportation (Non-Medical): No  Physical Activity: Insufficiently Active (09/17/2023)   Exercise Vital Sign    Days of Exercise per Week: 1 day    Minutes of Exercise per Session: 20 min  Stress: Stress Concern Present (09/17/2023)   [name] of Occupational Health - Occupational Stress Questionnaire    Feeling of Stress : To some extent  Social Connections: Socially Integrated (09/17/2023)   Social Connection and Isolation Panel    Frequency of Communication with Friends and Family: Twice a week    Frequency of Social Gatherings with Friends and Family: Twice a week    Attends Religious Services: 1 to 4 times per year    Active Member of Clubs or Organizations: Patient unable to answer    Attends Banker Meetings: 1 to 4 times per year    Marital Status: Married  Catering Manager Violence: Not At Risk (03/06/2024)   Humiliation, Afraid, Rape, and Kick questionnaire    Fear of Current or Ex-Partner: No    Emotionally Abused: No    Physically Abused: No    Sexually Abused: No    Physical Exam      Future Appointments  Date Time Provider Department Center  04/27/2024  1:45 PM Gaynel Delon CROME, DPM [facility]  05/03/2024  3:00 PM [facility] PA/NP [facility] None  05/10/2024  7:00 AM [facility] DEVICE REMOTES [facility]  06/12/2024  7:10 AM [facility] HVT DEVICE REMOTES [facility]  09/07/2024  3:20 PM Arfeen, Leni DASEN, MD [facility] None  09/11/2024  7:10 AM [facility] HVT DEVICE REMOTES [facility] H&V  12/11/2024  7:10 AM [facility] HVT DEVICE REMOTES [facility]  03/12/2025  7:10 AM [facility] HVT DEVICE REMOTES [facility]  05/02/2025  8:50 AM Patel, Donika K, DO [facility] None     ACTION: Home visit completed

## 2024-04-27 ENCOUNTER — Ambulatory Visit: Admitting: Podiatry

## 2024-04-27 ENCOUNTER — Encounter: Payer: Self-pay | Admitting: Podiatry

## 2024-04-27 DIAGNOSIS — B351 Tinea unguium: Secondary | ICD-10-CM | POA: Diagnosis not present

## 2024-04-27 DIAGNOSIS — M79675 Pain in left toe(s): Secondary | ICD-10-CM | POA: Diagnosis not present

## 2024-04-27 DIAGNOSIS — M79674 Pain in right toe(s): Secondary | ICD-10-CM | POA: Diagnosis not present

## 2024-04-27 DIAGNOSIS — E1142 Type 2 diabetes mellitus with diabetic polyneuropathy: Secondary | ICD-10-CM | POA: Diagnosis not present

## 2024-04-30 NOTE — Progress Notes (Signed)
 Subjective:  Patient ID: Rhonda [name], female    DOB: [birth year],  MRN: [id number]  Rhonda [name] presents to clinic today for at risk foot care with history of diabetic neuropathy and painful elongated mycotic toenails 1-5 bilaterally which are tender when wearing enclosed shoe gear. Pain is relieved with periodic professional debridement.  Chief Complaint  Patient presents with   Diabetes    3 mnth [facility] AWARE APPT IN [place] 03/15/2024.A1c is 7. Dr. Chrystal is her PCP last visit was in June   New problem(s): None.   PCP is Rhonda Lamarr GORMAN, MD.  Allergies  Allergen Reactions   Ceftin Anaphylaxis    Face and throat swell    Cefuroxime Axetil Anaphylaxis    Face and throat swell   Geodon [Ziprasidone Hcl] Hives   Lisinopril  Other (See Comments)    angioedema  lisinopril   Other Reaction(s): angioedema   Shellfish Allergy Other (See Comments)    Gout exacerbation  shellfish derived   Shellfish Protein-Containing Drug Products Other (See Comments)    Other reaction(s): Other  shellfish derived   Cefuroxime     Other reaction(s): anaphylaxis  Other Reaction(s): Unknown   Sulfacetamide Sodium-Sulfur      Other reaction(s): itch   Ziprasidone     Other reaction(s): hives on left arm  Other Reaction(s): hives on left arm   Allopurinol  Nausea Only and Other (See Comments)    weakness  Other Reaction(s): nausea, weakness   Diazepam  Other (See Comments)    Patient states that diazepam  doesn't relax, it has the opposite effect.  Valium    Lorazepam  Itching   Sulfa Antibiotics Itching    Review of Systems: Negative except as noted in the HPI.  Objective: No changes noted in today's physical examination. There were no vitals filed for this visit. ZANI KYLLONEN is a pleasant 73 y.o. female in NAD. AAO x 3.  Vascular Examination: CFT <3 seconds b/l. DP/PT pulses faintly palpable b/l. Pedal edema trace b/l. Skin temperature gradient warm to warm b/l.  Digital hair absent b/l. No pain with calf compression. No ischemia or gangrene. No cyanosis or clubbing noted b/l.    Neurological Examination: Pt has subjective symptoms of neuropathy. Protective sensation diminished with 10g monofilament b/l.  Dermatological Examination: Pedal skin warm and supple b/l.   No open wounds. No interdigital macerations.  Toenails 1-5 b/l thick, discolored, elongated with subungual debris and pain on dorsal palpation.    No hyperkeratotic nor porokeratotic lesions.  Musculoskeletal Examination: Muscle strength 5/5 to all lower extremity muscle groups bilaterally. Pes planus deformity noted bilateral LE.  Radiographs: None  Assessment/Plan: 1. Pain due to onychomycosis of toenails of both feet   2. Diabetic peripheral neuropathy associated with type 2 diabetes mellitus [facility])   Consent given for treatment. Patient examined. All patient's and/or POA's questions/concerns addressed on today's visit. Mycotic toenails 1-5 b/l debrided in length and girth without incident. Continue foot and shoe inspections daily. Monitor blood glucose per PCP/Endocrinologist's recommendations.Continue soft, supportive shoe gear daily. Report any pedal injuries to medical professional. Call office if there are any quesitons/concerns. -Patient/POA to call should there be question/concern in the interim.   Return in about 3 months (around 07/26/2024).  Delon LITTIE Merlin, DPM      [facility] LOCATION: [address]  [place], [place] [address]                   Office [phone]   [facility] LOCATION: [address] [place], [place] [address] Office [phone]

## 2024-05-01 NOTE — Progress Notes (Signed)
 [facility] Note   Primary Cardiologist: Dr Shlomo  PCP: Chrystal Lamarr RAMAN, MD HF Cardiologist: Dr. Cherrie  HPI: Rhonda [name] is a 73 y.o. 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 She has had sleep study with no evidence of sleep apnea in remote past.  She has a Medtronic CRT-D device. Echo in 7/18 showed recovery of EF to 55-60%. PYP 09/02/17 negative for TTR (Grade 0-1, H/CCL 1.2). SPEP with no M-spike.   Has been followed by Tonita Blanch in Neurology. Had been on Florinef  and midodrine  for orthostatic hypotension (failed mestinon).   Seen by Dr. Marlee and SCr slightly worse, so Florinef  stopped. Echo 1/23 EF 60-65%, RV ok  Admitted 5/23 with AKI. Resuscitated with IVF, Florinef  and midodrine  restarted. SCr stabilized at 1.6-1.8.  Follow up 7/24, volume mildly up and BP elevated. Midodrine  decreased to 5 bid, repeat echo arranged.  Echo 12/30/22 EF 55-60% G1DD RV normal.   OptiVol elevated on remote interrogation 9/17. She was given furoscix  X 3 days.  Here today for CHF follow-up.  Accompanied by Paramedic, [name], and her husband who provide most of the history. Cognitive status and mobility have worsened over the last year. She is sleepy during today's visit, took higher dose of gabapentin  today for her neuropathy after recently running out of the medicine. Home weight down 9 lb last week (196 lb) after recent use of furoscix . Also on GLP1-RA. No weight at home this week, clinic scale tends to be about 10 lb higher than home (204 lb today). No reports of shortness of breath, orthopnea, PND or lower extremity edema. Has  cut back on sodium intake and eats less take out with assistance from her husband.  Cardiac Studies  - Echo (5/14): EF 35% RV ok  - Echo (11/14): EF 20-25%, LV moderately dilated - Echo (5/15): EF 45-50% - Echo (7/18): EF 55-60%, normal RV size and systolic function, PASP 34 mmHg - PYP (4/19): negative TTR - Echo (1/21): EF 60-65% grade II DD. RV ok - Echo (1/23): EF 60-65%, RV ok - Echo (8/24): EF 55-60%, G1DD, RV normal  Review of systems complete and found to be negative unless listed in HPI.   Past Medical History:  Diagnosis Date   Abnormal gait 06/11/2020   Acute left ankle pain 10/11/2021   AKI (acute kidney injury) 10/08/2021   Allergic rhinitis 06/11/2020   Anemia    Arthritis    Right knee   Asthma 06/11/2020   Atrophic gastritis 06/11/2020   Back pain    Disk problem   Benign intracranial hypertension 06/11/2020   Bilateral leg weakness 08/01/2019   Biventricular ICD (implantable cardioverter-defibrillator) in place 08/04/2013   Body mass index (BMI) 45.0-49.9, adult 06/11/2020   Bowel incontinence 06/11/2020   Cardiomyopathy    Cholelithiasis 06/11/2020   Chronic respiratory failure 09/14/2013   Chronic sinusitis 06/11/2020   Chronic systolic heart failure 10/27/2012   a) NICM b) ECHO (03/2013) EF 20-25% c) ECHO (09/2013) EF 45-50%, grade I DD   CKD (chronic kidney disease) stage 3, GFR 30-59 ml/min 11/08/2018   Cleft palate 06/11/2020   Complication of anesthesia    History of  low blood pressure after surgery; attributed to lying flat   Daytime somnolence 06/11/2020   Decreased estrogen level 08/01/2021   Diabetic neuropathy    Diarrhea of presumed infectious origin 06/11/2020   Edema 06/11/2020   Elevated troponin 12/02/2021   Endometrial polyp 01/20/2012   Exertional shortness of breath    Generalized anxiety disorder    GERD (gastroesophageal reflux disease)    Gout 06/11/2020   Heart murmur    History of colonic polyps 06/11/2020   History of  fall 06/11/2020   Hyperlipidemia    Hyperparathyroidism 08/01/2021   Hypertension    Hypokalemia 11/08/2018   Hyponatremia 10/08/2021   Hypothyroidism    Insomnia 06/11/2020   Intractable nausea and vomiting 10/08/2021   Irritable bowel syndrome 01/04/2020   Left bundle branch block    s/p CRT-D (04/2013)   Lumbago without sciatica 04/30/2021   Lumbar spondylosis with myelopathy 06/11/2020   Macrocytosis 06/11/2020   Major depressive disorder    Malignant tumor of breast 03/26/2011   Left; completed chemotherapy and radiation treatments   Migraines    Mild dementia 04/20/2023   On home oxygen  therapy    2L suppose to be q night (05/03/2013)   Orthostatic hypotension 07/28/2017   Spinal stenosis of lumbar region 01/03/2020   Spondylolisthesis 08/01/2021   SVD (spontaneous vaginal delivery)    x 2   Type II diabetes mellitus 06/15/2019   Varicose veins of bilateral lower extremities with other complications 08/01/2021   Vitamin D  deficiency 03/26/2011   Current Outpatient Medications  Medication Sig Dispense Refill   acetaminophen  (TYLENOL ) 500 MG tablet Take 500 mg by mouth every 6 (six) hours as needed.     acetaZOLAMIDE  (DIAMOX ) 125 MG tablet Take 1 tablet (125 mg total) by mouth daily. 90 tablet 3   albuterol  (VENTOLIN  HFA) 108 (90 Base) MCG/ACT inhaler INHALE TWO PUFFS BY MOUTH EVERY 4 HOURS AS NEEDED     allopurinol  (ZYLOPRIM ) 100 MG tablet Take 0.5 tablets (50 mg total) by mouth daily. 45 tablet 2   amoxicillin  (AMOXIL ) 875 MG tablet TAKE 1 TABLET BY MOUTH TWICE A DAY 7 DAYS     azelastine  (ASTELIN ) 0.1 % nasal spray Place 2 sprays into both nostrils 2 (two) times daily. Use in each nostril as directed 30 mL 12   buPROPion  (WELLBUTRIN  XL) 150 MG 24 hr tablet Take 1 tablet (150 mg total) by mouth daily. 30 tablet 5   Capsaicin 0.1 % CREA 1 application as needed Externally Three times a day as needed     carvedilol  (COREG ) 3.125 MG tablet TAKE 1 TABLET (3.125 MG) BY  MOUTH TWICE DAILY WITH MEALS 180 tablet 3   citalopram  (CELEXA ) 20 MG tablet Take 1 tablet (20 mg total) by mouth daily. 30 tablet 5   clonazePAM  (KLONOPIN ) 0.5 MG tablet Take 1 tablet (0.5 mg total) by mouth at bedtime. 30 tablet 5   COMIRNATY syringe      cyanocobalamin  (VITAMIN B12) 1000 MCG tablet Take 1,000 mcg by mouth daily.     desloratadine  (CLARINEX ) 5 MG tablet TAKE 1 TABLET (5 MG TOTAL) BY MOUTH DAILY FOR ALLERGIES 90 tablet 3   dicyclomine  (BENTYL ) 20 MG tablet TAKE 1 TABLET BY MOUTH THREE TIMES DAILY 30 MINUTES BEFORE EATING     famotidine (PEPCID) 20 MG tablet Take 20 mg by mouth as needed for heartburn or indigestion.     ferrous sulfate  325 (65 FE) MG tablet 1 tablet Orally Once a day  fluticasone  (FLONASE ) 50 MCG/ACT nasal spray Place 1 spray into both nostrils daily.     Furosemide  (FUROSCIX ) 80 MG/10ML CTKT Inject 80 mg into the skin daily as needed. As directed by the heart failure clinic 5 each 1   gabapentin  (NEURONTIN ) 100 MG capsule Take 1 capsule (100 mg total) by mouth 2 (two) times daily. 180 capsule 3   ipratropium (ATROVENT ) 0.03 % nasal spray      levothyroxine  (SYNTHROID ) 88 MCG tablet TAKE 1 TABLET(88 MCG) BY MOUTH DAILY BEFORE BREAKFAST 90 tablet 3   memantine  (NAMENDA ) 5 MG tablet Take 5 mg by mouth 2 (two) times daily.     metolazone  (ZAROXOLYN ) 2.5 MG tablet TAKE 1 TABLET BY MOUTH ONCE A WEEK 4 tablet 1   midodrine  (PROAMATINE ) 5 MG tablet Take 1 tablet (5 mg total) by mouth 3 (three) times daily. 200 tablet 3   Multiple Vitamin (MULTIVITAMIN WITH MINERALS) TABS tablet Take 1 tablet by mouth daily.     nicotine polacrilex (COMMIT) 2 MG lozenge as directed Mouth/Throat every 4 hours     nystatin (MYCOSTATIN) 100000 UNIT/ML suspension 4 mL swish and swallow Mouth/Throat Four times a day; Duration: 14 days     phenol (CHLORASEPTIC) 1.4 % LIQD Use as directed 1 spray in the mouth or throat as needed for throat irritation / pain.     Potassium Chloride  ER 20  MEQ TBCR 2 tablets with food Orally three times a day     potassium chloride  SA (KLOR-CON  M) 20 MEQ tablet Take 3 tablets (60 mEq total) by mouth 2 (two) times daily. 572 tablet 3   risperiDONE  (RISPERDAL ) 0.5 MG tablet Take 1 tablet (0.5 mg total) by mouth 2 (two) times daily at 8 am and 4 pm. 60 tablet 5   simvastatin  (ZOCOR ) 10 MG tablet Take 1 tablet (10 mg total) by mouth every evening. 90 tablet 3   spironolactone  (ALDACTONE ) 25 MG tablet TAKE 1 TABLET (25 MG TOTAL) BY MOUTH EVERY EVENING. 90 tablet 3   tirzepatide  (MOUNJARO ) 12.5 MG/0.5ML Pen INJECT 12.5 MG INTO THE SKIN ONCE A WEEK. 2 mL 3   torsemide  (DEMADEX ) 100 MG tablet Take 1 tablet (100 mg total) by mouth 2 (two) times daily. 180 tablet 3   No current facility-administered medications for this visit.   There were no vitals taken for this visit.  Wt Readings from Last 3 Encounters:  04/26/24 89.8 kg (198 lb)  04/12/24 89.8 kg (198 lb)  04/05/24 92.1 kg (203 lb)     General:  Chronically ill appearing elderly female. No distress. Cor: JVD difficult but does not appear elevated. Regular rate & rhythm. No murmurs. Lungs: clear Abdomen: obese, soft, nontender, nondistended.  Extremities: no edema Neuro: Drowsy but wakes up and answers questions appropriately    Device interrogation (personally reviewed): Unable to interrogate device in clinic today Reviewed interrogation from 10/1: OptiVol trending down and thoracic impedance starting to trend back up  ReDS 26%, low normal  ASSESSMENT & PLAN:  1) Chronic systolic HF with recovered EF/nonischemic cardiomyopathy:  - NICM s/p Medtronic CRT-D, ? Viral myocarditis. Cardiomyopathy dates from 23.  - Echo (5/15): EF 45-50% - Echo (2020): EF up to 55-60%.  - PYP (4/19) negative for TTR (Grade 0-1, H/CCL 1.2). SPEP with no M-spike.  - Echo (1/21): EF 60-65% grade II DD. RV ok.  - Echo (1/23): EF 60-65% RV normal.  - Echo 12/30/22: EF 55-60% G1DD RV normal.  - NYHA III.  Mobility has  declined along with cognitive status over the last year. Not very functional.  - Volume okay on exam. OptiVol trending down on device check last week. Home weight down significantly. ReDS down to 26% (33% last visit). Continue Torsemide  100 mg BID + once weekly metolazone  (2.5 mg) on Tuesday. Will contact [name], RN to obtain updated remote interrogation - BMET today - Continue carvedilol  3.125 mg bid. - Continue spironolactone  25 mg daily.  - Continue midodrine  5 mg tid. - No SGT2i with chronic yeast infections. - Continue with paramedicine. Appreciate assistance. - Check BMP today  - update echo   2) Obesity - There is no height or weight on file to calculate BMI. - Now on GLP1RA, weight coming down.  3) HTN - BP controlled.  - Has been on midodrine   4) Orthostatic Hypotension.  - Neurology managing.  - She is on diamox  for increased ICP. - Continue midodrine  as above  - No change  5) Chronic Venous stasis - Encouraged her to use compression stockings   - No change  6) Falls  - No recent falls - Follows with Neurology.  - continue midodrine  support   7) CKD IV - Baseline Scr recently 1.8-2 - Follows with Dr. Marlee in Nephrology - not on SGLT2i given h/o frequent yeast infections  - Labs today  8) Cognitive impairment - Undergoing evaluation with Neurology - Concern for Alzheimer's disease   Follow-up: 2 months with APP   Harlene CHRISTELLA Gainer, FNP  3:21 PM

## 2024-05-02 ENCOUNTER — Telehealth (HOSPITAL_COMMUNITY): Payer: Self-pay

## 2024-05-02 NOTE — Telephone Encounter (Signed)
 Called to confirm/remind patient of their appointment at the [facility] on 05/03/24.   Appointment:   [x] Confirmed  [] Left mess   [] No answer/No voice mail  [] VM Full/unable to leave message  [] Phone not in service  Patient reminded to bring all medications and/or complete list.  Confirmed patient has transportation. Gave directions, instructed to utilize valet parking.

## 2024-05-03 ENCOUNTER — Other Ambulatory Visit (HOSPITAL_COMMUNITY): Payer: Self-pay

## 2024-05-03 ENCOUNTER — Encounter (HOSPITAL_COMMUNITY): Payer: Self-pay

## 2024-05-03 ENCOUNTER — Ambulatory Visit (HOSPITAL_COMMUNITY)
Admission: RE | Admit: 2024-05-03 | Discharge: 2024-05-03 | Disposition: A | Discharge status: Home / Self Care | Source: Ambulatory Visit | Attending: Family Medicine | Admitting: Family Medicine

## 2024-05-03 VITALS — BP 128/72 | HR 76 | Ht 63.0 in | Wt 197.8 lb

## 2024-05-03 DIAGNOSIS — I5022 Chronic systolic (congestive) heart failure: Secondary | ICD-10-CM | POA: Insufficient documentation

## 2024-05-03 DIAGNOSIS — R4189 Other symptoms and signs involving cognitive functions and awareness: Secondary | ICD-10-CM | POA: Diagnosis not present

## 2024-05-03 DIAGNOSIS — Z6835 Body mass index (BMI) 35.0-35.9, adult: Secondary | ICD-10-CM | POA: Insufficient documentation

## 2024-05-03 DIAGNOSIS — Z9221 Personal history of antineoplastic chemotherapy: Secondary | ICD-10-CM | POA: Insufficient documentation

## 2024-05-03 DIAGNOSIS — I10 Essential (primary) hypertension: Secondary | ICD-10-CM

## 2024-05-03 DIAGNOSIS — Z9981 Dependence on supplemental oxygen: Secondary | ICD-10-CM | POA: Insufficient documentation

## 2024-05-03 DIAGNOSIS — Z923 Personal history of irradiation: Secondary | ICD-10-CM | POA: Insufficient documentation

## 2024-05-03 DIAGNOSIS — Z853 Personal history of malignant neoplasm of breast: Secondary | ICD-10-CM | POA: Insufficient documentation

## 2024-05-03 DIAGNOSIS — I428 Other cardiomyopathies: Secondary | ICD-10-CM | POA: Insufficient documentation

## 2024-05-03 DIAGNOSIS — Z9181 History of falling: Secondary | ICD-10-CM | POA: Diagnosis not present

## 2024-05-03 DIAGNOSIS — R5381 Other malaise: Secondary | ICD-10-CM | POA: Insufficient documentation

## 2024-05-03 DIAGNOSIS — N184 Chronic kidney disease, stage 4 (severe): Secondary | ICD-10-CM | POA: Diagnosis not present

## 2024-05-03 DIAGNOSIS — E669 Obesity, unspecified: Secondary | ICD-10-CM | POA: Diagnosis not present

## 2024-05-03 DIAGNOSIS — I951 Orthostatic hypotension: Secondary | ICD-10-CM | POA: Insufficient documentation

## 2024-05-03 DIAGNOSIS — I872 Venous insufficiency (chronic) (peripheral): Secondary | ICD-10-CM | POA: Diagnosis not present

## 2024-05-03 DIAGNOSIS — J069 Acute upper respiratory infection, unspecified: Secondary | ICD-10-CM | POA: Insufficient documentation

## 2024-05-03 DIAGNOSIS — F32A Depression, unspecified: Secondary | ICD-10-CM | POA: Insufficient documentation

## 2024-05-03 DIAGNOSIS — Z95810 Presence of automatic (implantable) cardiac defibrillator: Secondary | ICD-10-CM | POA: Insufficient documentation

## 2024-05-03 DIAGNOSIS — Z7985 Long-term (current) use of injectable non-insulin antidiabetic drugs: Secondary | ICD-10-CM | POA: Insufficient documentation

## 2024-05-03 DIAGNOSIS — I130 Hypertensive heart and chronic kidney disease with heart failure and stage 1 through stage 4 chronic kidney disease, or unspecified chronic kidney disease: Secondary | ICD-10-CM | POA: Insufficient documentation

## 2024-05-03 DIAGNOSIS — F03A0 Unspecified dementia, mild, without behavioral disturbance, psychotic disturbance, mood disturbance, and anxiety: Secondary | ICD-10-CM | POA: Insufficient documentation

## 2024-05-03 DIAGNOSIS — I878 Other specified disorders of veins: Secondary | ICD-10-CM | POA: Insufficient documentation

## 2024-05-03 DIAGNOSIS — Z79899 Other long term (current) drug therapy: Secondary | ICD-10-CM | POA: Insufficient documentation

## 2024-05-03 DIAGNOSIS — Z87730 Personal history of (corrected) cleft lip and palate: Secondary | ICD-10-CM | POA: Insufficient documentation

## 2024-05-03 DIAGNOSIS — F411 Generalized anxiety disorder: Secondary | ICD-10-CM | POA: Insufficient documentation

## 2024-05-03 DIAGNOSIS — G932 Benign intracranial hypertension: Secondary | ICD-10-CM | POA: Insufficient documentation

## 2024-05-03 DIAGNOSIS — J9610 Chronic respiratory failure, unspecified whether with hypoxia or hypercapnia: Secondary | ICD-10-CM | POA: Insufficient documentation

## 2024-05-03 LAB — BASIC METABOLIC PANEL WITH GFR
Anion gap: 15 (ref 5–15)
BUN: 47 mg/dL — ABNORMAL HIGH (ref 8–23)
CO2: 28 mmol/L (ref 22–32)
Calcium: 9.9 mg/dL (ref 8.9–10.3)
Chloride: 96 mmol/L — ABNORMAL LOW (ref 98–111)
Creatinine, Ser: 2.73 mg/dL — ABNORMAL HIGH (ref 0.44–1.00)
GFR, Estimated: 18 mL/min — ABNORMAL LOW (ref >60)
Glucose, Bld: 94 mg/dL (ref 70–99)
Potassium: 4 mmol/L (ref 3.5–5.1)
Sodium: 139 mmol/L (ref 135–145)

## 2024-05-03 LAB — BRAIN NATRIURETIC PEPTIDE: B Natriuretic Peptide: 63.4 pg/mL (ref 0.0–100.0)

## 2024-05-03 LAB — CBC
HCT: 40.3 % (ref 36.0–46.0)
Hemoglobin: 13.4 g/dL (ref 12.0–15.0)
MCH: 35.4 pg — ABNORMAL HIGH (ref 26.0–34.0)
MCHC: 33.3 g/dL (ref 30.0–36.0)
MCV: 106.3 fL — ABNORMAL HIGH (ref 80.0–100.0)
Platelets: 180 K/uL (ref 150–400)
RBC: 3.79 MIL/uL — ABNORMAL LOW (ref 3.87–5.11)
RDW: 13.2 % (ref 11.5–15.5)
WBC: 9.1 K/uL (ref 4.0–10.5)
nRBC: 0 % (ref 0.0–0.2)

## 2024-05-03 LAB — IRON AND TIBC
Iron: 72 ug/dL (ref 28–170)
Saturation Ratios: 18 % (ref 10.4–31.8)
TIBC: 395 ug/dL (ref 250–450)
UIBC: 323 ug/dL

## 2024-05-03 LAB — FERRITIN: Ferritin: 299 ng/mL (ref 11–307)

## 2024-05-03 NOTE — Patient Instructions (Addendum)
 Good to see you today!   Labs done today, your results will be available in MyChart, we will contact you for abnormal readings.  Your physician recommends that you schedule a follow-up appointment as scheduled  If you have any questions or concerns before your next appointment please send us  a message through [facility] or call our office at [phone].    TO LEAVE A MESSAGE FOR THE NURSE SELECT OPTION 2, PLEASE LEAVE A MESSAGE INCLUDING: YOUR NAME DATE OF BIRTH CALL BACK NUMBER REASON FOR CALL**this is important as we prioritize the call backs  YOU WILL RECEIVE A CALL BACK THE SAME DAY AS LONG AS YOU CALL BEFORE 4:00 PM At the [facility], you and your health needs are our priority. As part of our continuing mission to provide you with exceptional heart care, we have created designated Provider Care Teams. These Care Teams include your primary Cardiologist (physician) and Advanced Practice Providers (APPs- Physician Assistants and Nurse Practitioners) who all work together to provide you with the care you need, when you need it.   You may see any of the following providers on your designated Care Team at your next follow up: Dr Toribio Fuel Dr Ezra Shuck Dr. Morene Brownie Greig Mosses, NP Caffie Shed, [place] [facility] [place], [place] Beckey Coe, NP Jordan Lee, NP Ellouise Class, NP Tinnie Redman, PharmD Jaun Bash, PharmD   Please be sure to bring in all your medications bottles to every appointment.    Thank you for choosing [facility]

## 2024-05-03 NOTE — Progress Notes (Signed)
 Paramedicine Encounter  Patient ID: Rhonda [name], female, DOB: February 08, 1951, 73 y.o.,  MRN: [id number]  Met patient in clinic today with Harlene Gainer NP, Rhonda [name] reports not feeling well today. She had some slight increase of volume noted on her optivol but not enough to make changes. They obtained labs today and will make changes accordingly. I filled pill box for one week. I plan to see Rhonda [name] in one week.   Weight @ clinic- 197lbs  Weight @ home- 198lbs   B/P- 128/72   P- 76   SP02- 98%  REDS CLIP- N/A  Med changes- none pending labs   Social Changes- husband seeking care for her adult day care program at [facility], EMT-Paramedic [phone] 05/03/2024

## 2024-05-03 NOTE — Progress Notes (Signed)
 Handicapped placard form signed by provider,given to son.

## 2024-05-05 ENCOUNTER — Ambulatory Visit (HOSPITAL_COMMUNITY): Payer: Self-pay | Admitting: Family Medicine

## 2024-05-10 ENCOUNTER — Other Ambulatory Visit (HOSPITAL_COMMUNITY): Payer: Self-pay

## 2024-05-10 ENCOUNTER — Ambulatory Visit: Attending: Internal Medicine

## 2024-05-10 DIAGNOSIS — I5022 Chronic systolic (congestive) heart failure: Secondary | ICD-10-CM

## 2024-05-10 DIAGNOSIS — Z95810 Presence of automatic (implantable) cardiac defibrillator: Secondary | ICD-10-CM | POA: Diagnosis not present

## 2024-05-10 NOTE — Progress Notes (Signed)
 Paramedicine Encounter    Patient ID: Rhonda [name], female    DOB: 04/30/1951, 73 y.o.   MRN: [id number]   Complaints- neck pain   Assessment- CAOX4, warm and dry ambulatory around her home, talking with me, often repeating questions (normal for her at baseline) no shortness of breath, no edema noted, weight down 4 lbs, lungs clear, vitals within normal.   Compliance with meds- missed one noon and one morning dose   Pill box filled- for two weeks   Refills needed- (in one week) torsemide , spiro, risperidone ,namenda , bupropion   Meds changes since last visit- none     Social changes- none    VISIT SUMMARY- Arrived for home visit for Rhonda [name] who was ambulating in the kitchen CAOX4, warm and dry reporting to be having some neck pain. Thinks she may have slept on it wrong. I obtained vitals and assessment. No chest pain, no dizziness, no shortness of breath, no edema, lungs clear. One morning and one noon dose missed of meds- we talked about med compliance. Diet has improved. Education provided. Meds reviewed and two weeks of pill boxes filled. Home visit complete. I will see her in two weeks. She knows to reach out if a need arises.   BP 110/70   Pulse 96   Resp 16   Wt 194 lb 3.2 oz (88.1 kg)   SpO2 95%   BMI 34.40 kg/m  Weight yesterday-- didn't weigh  Last visit weight-- 198lbs      ACTION: Home visit completed     Patient Care Team: Chrystal Lamarr GORMAN, MD as PCP - General (Family Medicine) Waddell Danelle ORN, MD as PCP - Electrophysiology (Cardiology) Cathern Andriette DEL, LCSW as Social Worker (Licensed Clinical Social Worker) Tobie, Tonita POUR, DO as Consulting Physician (Neurology) Jacques Moats, Paramedic as Paramedic Chrystal, Lamarr GORMAN, MD as Attending Physician (Family Medicine) Bensimhon, Toribio SAUNDERS, MD as Consulting Physician (Cardiology) Curry, Leni DASEN, MD as Consulting Physician (Psychiatry) Tobie Franky SQUIBB, DPM as Consulting Physician (Podiatry) Aniceto Daphne CROME, NP as Nurse Practitioner (Cardiology) Gaynel Delon CROME, DPM as Consulting Physician (Podiatry)  Patient Active Problem List   Diagnosis Date Noted   Mild dementia 04/20/2023   Diabetic neuropathy    Elevated troponin 12/02/2021   Acute left ankle pain 10/11/2021   Hyponatremia 10/08/2021   Decreased estrogen level 08/01/2021   Hyperparathyroidism 08/01/2021   Spondylolisthesis 08/01/2021   Varicose veins of bilateral lower extremities with other complications 08/01/2021   Lumbago without sciatica 04/30/2021   Abnormal gait 06/11/2020   Allergic rhinitis 06/11/2020   Asthma 06/11/2020   Benign intracranial hypertension 06/11/2020   Body mass index (BMI) 45.0-49.9, adult 06/11/2020   Bowel incontinence 06/11/2020   Cholelithiasis 06/11/2020   Chronic sinusitis 06/11/2020   Cleft palate 06/11/2020   Daytime somnolence 06/11/2020   Edema 06/11/2020   Family history of malignant neoplasm of gastrointestinal tract 06/11/2020   Gout 06/11/2020   Insomnia 06/11/2020   Atrophic gastritis 06/11/2020   Lumbar spondylosis with myelopathy 06/11/2020   Macrocytosis 06/11/2020   History of colonic polyps 06/11/2020   Repeated falls 06/11/2020   Irritable bowel syndrome 01/04/2020   Spinal stenosis of lumbar region 01/03/2020   Bilateral leg weakness 08/01/2019   Type II diabetes mellitus 06/15/2019   Hypokalemia 11/08/2018   CKD (chronic kidney disease) stage 3, GFR 30-59 ml/min 11/08/2018   Orthostatic hypotension 07/28/2017   On home oxygen  therapy    Migraines    Left bundle branch block  Hypothyroidism    Hypertension    Hyperlipidemia    Heart murmur    GERD (gastroesophageal reflux disease)    Exertional shortness of breath    Major depressive disorder    Back pain    Arthritis    Generalized anxiety disorder    Anemia    Chronic respiratory failure 09/14/2013   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   Chronic systolic  heart failure 10/27/2012   Endometrial polyp 01/20/2012   Malignant tumor of breast 03/26/2011   Vitamin D  deficiency 03/26/2011   Current Medications[1] Allergies[2]   Social History   Socioeconomic History   Marital status: Married    Spouse name: Rhonda [name]   Number of children: 2   Years of education: 18   Highest education level: Master's degree (e.g., MA, MS, MEng, MEd, MSW, MBA)  Occupational History   Occupation: retired  Tobacco Use   Smoking status: Every Day    Current packs/day: 0.00    Average packs/day: 0.5 packs/day for 26.0 years (13.0 ttl pk-yrs)    Types: Cigarettes    Start date: 03/24/1995    Last attempt to quit: 03/23/2021    Years since quitting: 3.1   Smokeless tobacco: Never  Vaping Use   Vaping status: Never Used  Substance and Sexual Activity   Alcohol use: No   Drug use: No   Sexual activity: Yes  Other Topics Concern   Not on file  Social History Narrative   Tobacco Use Cigarettes: Former Smoker, Quit in 2008   No Alcohol   No recreational drug use   Diet: Regular/Low Carb   Exercise: None   Occupation: disabled   Education: Company Secretary, masters   Children: 2   Firearms: No   Risk Analyst Use: Always   Former wellsite geologist.    Right handed   Two story home   Lives with spouse Rhonda [name]      Social Drivers of Health   Tobacco Use: High Risk (05/03/2024)   Patient History    Smoking Tobacco Use: Every Day    Smokeless Tobacco Use: Never    Passive Exposure: Not on file  Financial Resource Strain: Low Risk (09/17/2023)   Overall Financial Resource Strain (CARDIA)    Difficulty of Paying Living Expenses: Not very hard  Food Insecurity: No Food Insecurity (03/06/2024)   Epic    Worried About Programme Researcher, Broadcasting/film/video in the Last Year: Never true    Ran Out of Food in the Last Year: Never true  Transportation Needs: No Transportation Needs (03/06/2024)   Epic    Lack of Transportation (Medical): No    Lack of Transportation (Non-Medical): No   Physical Activity: Insufficiently Active (09/17/2023)   Exercise Vital Sign    Days of Exercise per Week: 1 day    Minutes of Exercise per Session: 20 min  Stress: Stress Concern Present (09/17/2023)   [name] of Occupational Health - Occupational Stress Questionnaire    Feeling of Stress : To some extent  Social Connections: Socially Integrated (09/17/2023)   Social Connection and Isolation Panel    Frequency of Communication with Friends and Family: Twice a week    Frequency of Social Gatherings with Friends and Family: Twice a week    Attends Religious Services: 1 to 4 times per year    Active Member of Clubs or Organizations: Patient unable to answer    Attends Banker Meetings: 1 to 4 times per year  Marital Status: Married  Catering Manager Violence: Not At Risk (03/06/2024)   Epic    Fear of Current or Ex-Partner: No    Emotionally Abused: No    Physically Abused: No    Sexually Abused: No  Depression (PHQ2-9): Low Risk (09/17/2023)   Depression (PHQ2-9)    PHQ-2 Score: 1  Alcohol Screen: Low Risk (09/17/2023)   Alcohol Screen    Last Alcohol Screening Score (AUDIT): 0  Housing: Unknown (03/06/2024)   Epic    Unable to Pay for Housing in the Last Year: No    Number of Times Moved in the Last Year: Not on file    Homeless in the Last Year: No  Utilities: Not At Risk (03/06/2024)   Epic    Threatened with loss of utilities: No  Health Literacy: Inadequate Health Literacy (09/17/2023)   B1300 Health Literacy    Frequency of need for help with medical instructions: Often    Physical Exam      Future Appointments  Date Time Provider Department Center  06/12/2024  7:10 AM [facility] H[facility] DEVICE REMOTES [facility]  06/14/2024  7:15 AM [facility] DEVICE REMOTES [facility]  08/02/2024  3:00 PM [facility] PA/NP [facility] None  08/09/2024  4:00 PM Gaynel Delon CROME, DPM [facility]  09/07/2024  3:20 PM Arfeen, Leni DASEN, MD [facility] None  09/11/2024  7:10  AM [facility] H[facility] DEVICE REMOTES [facility]  12/11/2024  7:10 AM [facility] DEVICE REMOTES [facility]  03/12/2025  7:10 AM [facility] H[facility] DEVICE REMOTES [facility]  05/02/2025  8:50 AM Patel, Donika K, DO [facility] None                 [1]  Current Outpatient Medications:    acetaminophen  (TYLENOL ) 500 MG tablet, Take 500 mg by mouth every 6 (six) hours as needed., Disp: , Rfl:    acetaZOLAMIDE  (DIAMOX ) 125 MG tablet, Take 1 tablet (125 mg total) by mouth daily., Disp: 90 tablet, Rfl: 3   albuterol  (VENTOLIN  HFA) 108 (90 Base) MCG/ACT inhaler, INHALE TWO PUFFS BY MOUTH EVERY 4 HOURS AS NEEDED, Disp: , Rfl:    allopurinol  (ZYLOPRIM ) 100 MG tablet, Take 0.5 tablets (50 mg total) by mouth daily., Disp: 45 tablet, Rfl: 2   azelastine  (ASTELIN ) 0.1 % nasal spray, Place 2 sprays into both nostrils 2 (two) times daily. Use in each nostril as directed, Disp: 30 mL, Rfl: 12   buPROPion  (WELLBUTRIN  XL) 150 MG 24 hr tablet, Take 1 tablet (150 mg total) by mouth daily., Disp: 30 tablet, Rfl: 5   Capsaicin 0.1 % CREA, 1 application as needed Externally Three times a day as needed, Disp: , Rfl:    carvedilol  (COREG ) 3.125 MG tablet, TAKE 1 TABLET (3.125 MG) BY MOUTH TWICE DAILY WITH MEALS, Disp: 180 tablet, Rfl: 3   citalopram  (CELEXA ) 20 MG tablet, Take 1 tablet (20 mg total) by mouth daily., Disp: 30 tablet, Rfl: 5   clonazePAM  (KLONOPIN ) 0.5 MG tablet, Take 1 tablet (0.5 mg total) by mouth at bedtime., Disp: 30 tablet, Rfl: 5   cyanocobalamin  (VITAMIN B12) 1000 MCG tablet, Take 1,000 mcg by mouth daily., Disp: , Rfl:    desloratadine  (CLARINEX ) 5 MG tablet, TAKE 1 TABLET (5 MG TOTAL) BY MOUTH DAILY FOR ALLERGIES, Disp: 90 tablet, Rfl: 3   dicyclomine  (BENTYL ) 20 MG tablet, TAKE 1 TABLET BY MOUTH THREE TIMES DAILY 30 MINUTES BEFORE EATING, Disp: , Rfl:    famotidine (PEPCID) 20 MG tablet, Take 20  mg by mouth as needed for heartburn or indigestion., Disp: , Rfl:    ferrous sulfate  325 (65 FE) MG tablet, 1  tablet Orally Once a day, Disp: , Rfl:    fluticasone  (FLONASE ) 50 MCG/ACT nasal spray, Place 1 spray into both nostrils daily., Disp: , Rfl:    Furosemide  (FUROSCIX ) 80 MG/10ML CTKT, Inject 80 mg into the skin daily as needed. As directed by the heart failure clinic, Disp: 5 each, Rfl: 1   gabapentin  (NEURONTIN ) 100 MG capsule, Take 1 capsule (100 mg total) by mouth 2 (two) times daily., Disp: 180 capsule, Rfl: 3   ipratropium (ATROVENT ) 0.03 % nasal spray, , Disp: , Rfl:    levothyroxine  (SYNTHROID ) 88 MCG tablet, TAKE 1 TABLET(88 MCG) BY MOUTH DAILY BEFORE BREAKFAST, Disp: 90 tablet, Rfl: 3   memantine  (NAMENDA ) 5 MG tablet, Take 5 mg by mouth 2 (two) times daily., Disp: , Rfl:    metolazone  (ZAROXOLYN ) 2.5 MG tablet, TAKE 1 TABLET BY MOUTH ONCE A WEEK, Disp: 4 tablet, Rfl: 1   midodrine  (PROAMATINE ) 5 MG tablet, Take 1 tablet (5 mg total) by mouth 3 (three) times daily., Disp: 200 tablet, Rfl: 3   Multiple Vitamin (MULTIVITAMIN WITH MINERALS) TABS tablet, Take 1 tablet by mouth daily., Disp: , Rfl:    phenol (CHLORASEPTIC) 1.4 % LIQD, Use as directed 1 spray in the mouth or throat as needed for throat irritation / pain., Disp: , Rfl:    potassium chloride  SA (KLOR-CON  M) 20 MEQ tablet, Take 3 tablets (60 mEq total) by mouth 2 (two) times daily., Disp: 572 tablet, Rfl: 3   risperiDONE  (RISPERDAL ) 0.5 MG tablet, Take 1 tablet (0.5 mg total) by mouth 2 (two) times daily at 8 am and 4 pm., Disp: 60 tablet, Rfl: 5   simvastatin  (ZOCOR ) 10 MG tablet, Take 1 tablet (10 mg total) by mouth every evening., Disp: 90 tablet, Rfl: 3   spironolactone  (ALDACTONE ) 25 MG tablet, TAKE 1 TABLET (25 MG TOTAL) BY MOUTH EVERY EVENING., Disp: 90 tablet, Rfl: 3   tirzepatide  (MOUNJARO ) 12.5 MG/0.5ML Pen, INJECT 12.5 MG INTO THE SKIN ONCE A WEEK., Disp: 2 mL, Rfl: 3   torsemide  (DEMADEX ) 100 MG tablet, Take 1 tablet (100 mg total) by mouth 2 (two) times daily., Disp: 180 tablet, Rfl: 3 [2]  Allergies Allergen  Reactions   Ceftin Anaphylaxis    Face and throat swell    Cefuroxime Axetil Anaphylaxis    Face and throat swell   Geodon [Ziprasidone Hcl] Hives   Lisinopril  Other (See Comments)    angioedema  lisinopril   Other Reaction(s): angioedema   Shellfish Allergy Other (See Comments)    Gout exacerbation  shellfish derived   Shellfish Protein-Containing Drug Products Other (See Comments)    Other reaction(s): Other  shellfish derived   Cefuroxime     Other reaction(s): anaphylaxis  Other Reaction(s): Unknown   Sulfacetamide Sodium-Sulfur      Other reaction(s): itch   Ziprasidone     Other reaction(s): hives on left arm  Other Reaction(s): hives on left arm   Allopurinol  Nausea Only and Other (See Comments)    weakness  Other Reaction(s): nausea, weakness   Diazepam  Other (See Comments)    Patient states that diazepam  doesn't relax, it has the opposite effect.  Valium    Lorazepam  Itching   Sulfa Antibiotics Itching

## 2024-05-10 NOTE — Progress Notes (Signed)
 EPIC Encounter for ICM Monitoring  Patient Name: Rhonda [name] is a 73 y.o. female Date: 05/10/2024 Primary Care Physican: Chrystal Lamarr GORMAN, MD Primary Cardiologist: Bensimhon Electrophysiologist: Waddell Pore Pacing: 99.9%   06/01/2022 Weight: 193 lbs  04/26/2023 Weight: 214 lbs Per EMT note 07/14/2023 Weight: 226 lbs 10/13/2023 Office Weight: 210 lbs 12/15/2023 Weight: 206.3 lbs per [name], paramedic visit 01/12/2024 Weight: 205.8 lbs per [name], paramedic visit  01/19/2024 Weight: 203 lbs 02/10/2024 Weight: 202 lbs per [name] 03/22/2024 Weight: 205 lbs per [name] 04/12/2024 Weight: 198.9 per [name]   Clinical Status Since 03-May-2024 Time in AT/AF 0.0 hr/day (0.0%)          Transmission results reviewed.                  Since 03/29/2024 ICM Remote Transmission: [employer] thoracic impedance suggesting normal fluid levels with the exception of possible fluid accumulation starting 05/05/2024 but trending close to baseline.   Prescribed:  [name] with paramedicine program assists with meds.   Torsemide  100 mg 1 tablet(s) (100 mg total) by mouth twice a day Potassium 20 meq 3 tablets (60 mEq total) by mouth 2 (twice) a day.   Metolazone  2.5 mg 1 tablet by mouth once a week (every Tuesday).  Per 05/03/2024 HF clinic ov note: OK to take extra metolazone  dose PRN (requiring extra dose during the week about 1x/month)  Spironolactone  25 mg take 1 tablet by mouth every evening.   Labs: 05/03/2024 Creatinine 2.73, BUN 47, Potassium 4.0, Sodium 139, GFR 18 03/16/2024 Creatinine 2.26, BUN 49, Potassium 3.5, Sodium 134, GFR 23 03/01/2024 Creatinine 2.56, BUN 53, Potassium 4.5, Sodium 132, GFR 16 02/23/2024 Creatinine 2.29, BUN 46, Potassium 3.5, Sodium 134, GFR 22 01/26/2024 Creatinine 1.99, BUN 38, Potassium 3.7, Sodium 135, GFR 26, BNP 29.3 10/25/2023 Creatinine 1.78, BUN 46, Potassium  3.3, Sodium 136, GFR 30 10/13/2023 Creatinine 1.91, BUN 41, Potassium 3.5, Sodium 135, GFR 28, BNP 37.5 07/28/2023 Creatinine 2.06, BUN 52, Potassium 3.5, Sodium 135, eGFR 25 07/14/2023 Creatinine 1.91, BUN 47, Potassium 3.3, Sodium 134, eGFR 28, BNP 71.5 A complete set of results can be found in Results Review.   Recommendations:  No changes.      Follow-up plan: ICM clinic phone appointment on 06/14/2024.   91 day device clinic remote transmission 06/12/2024.     EP/Cardiology Office Visits:   08/02/2024 with HF Clinic.   Recall 05/11/2024 with Dr Cherrie.  Recall 03/21/2024 with Dr Waddell.     Copy of ICM check sent to Dr. Waddell.   Remote monitoring is medically necessary for Heart Failure Management.    Daily Thoracic Impedance ICM trend: 02/09/2024 through 05/10/2024.    12-14 Month Thoracic Impedance ICM trend:     [name], RN 05/10/2024 7:55 AM

## 2024-05-22 ENCOUNTER — Other Ambulatory Visit (HOSPITAL_COMMUNITY): Payer: Self-pay | Admitting: Family Medicine

## 2024-05-24 ENCOUNTER — Other Ambulatory Visit (HOSPITAL_COMMUNITY): Payer: Self-pay

## 2024-05-24 NOTE — Progress Notes (Signed)
 Paramedicine Encounter    Patient ID: Rhonda [name], female    DOB: Jun 21, 1950, 73 y.o.   MRN: [id number]   Complaints- general fatigue   Assessment- CAOx4, warm and dry seated in her chair in her bedroom with general complaints   Compliance with meds- missed one morning and one noon dose   Pill box filled- for one week   Refills needed- called into [facility]   Meds changes since last visit-  none    Social changes- none    VISIT SUMMARY- Arrived for home visit for Rhonda [name] who reports to be feeling okay just generally tired with some headaches. No shortness of breath, no chest pain, no dizziness, some lower leg swelling, lungs clear, missed one evening dose and one noon dose. I discussed med compliance education with her and her family during visit. We discussed heart healthy diet choices. She understood and says she will do better. I reviewed meds and filled pill box for one week. We wrote down upcoming appointments for her husband. Home visit complete. I will see her in one week.   BP 102/62   Pulse 86   Resp 16   Wt 194 lb (88 kg)   SpO2 96%   BMI 34.37 kg/m  Weight yesterday-- did not weigh  Last visit weight-- 194lbs      ACTION: Home visit completed     Patient Care Team: Chrystal Lamarr GORMAN, MD as PCP - General (Family Medicine) Waddell Danelle ORN, MD as PCP - Electrophysiology (Cardiology) Cathern Andriette DEL, LCSW as Social Worker (Licensed Clinical Social Worker) Tobie, Tonita POUR, DO as Consulting Physician (Neurology) Jacques Moats, Paramedic as Paramedic Chrystal, Lamarr GORMAN, MD as Attending Physician (Family Medicine) Bensimhon, Toribio SAUNDERS, MD as Consulting Physician (Cardiology) Curry, Leni DASEN, MD as Consulting Physician (Psychiatry) Tobie Franky SQUIBB, DPM as Consulting Physician (Podiatry) Aniceto Daphne CROME, NP as Nurse Practitioner (Cardiology) Gaynel Delon CROME, DPM as Consulting Physician (Podiatry)  Patient Active Problem List   Diagnosis Date Noted    Mild dementia 04/20/2023   Diabetic neuropathy    Elevated troponin 12/02/2021   Acute left ankle pain 10/11/2021   Hyponatremia 10/08/2021   Decreased estrogen level 08/01/2021   Hyperparathyroidism 08/01/2021   Spondylolisthesis 08/01/2021   Varicose veins of bilateral lower extremities with other complications 08/01/2021   Lumbago without sciatica 04/30/2021   Abnormal gait 06/11/2020   Allergic rhinitis 06/11/2020   Asthma 06/11/2020   Benign intracranial hypertension 06/11/2020   Body mass index (BMI) 45.0-49.9, adult 06/11/2020   Bowel incontinence 06/11/2020   Cholelithiasis 06/11/2020   Chronic sinusitis 06/11/2020   Cleft palate 06/11/2020   Daytime somnolence 06/11/2020   Edema 06/11/2020   Family history of malignant neoplasm of gastrointestinal tract 06/11/2020   Gout 06/11/2020   Insomnia 06/11/2020   Atrophic gastritis 06/11/2020   Lumbar spondylosis with myelopathy 06/11/2020   Macrocytosis 06/11/2020   History of colonic polyps 06/11/2020   Repeated falls 06/11/2020   Irritable bowel syndrome 01/04/2020   Spinal stenosis of lumbar region 01/03/2020   Bilateral leg weakness 08/01/2019   Type II diabetes mellitus 06/15/2019   Hypokalemia 11/08/2018   CKD (chronic kidney disease) stage 3, GFR 30-59 ml/min 11/08/2018   Orthostatic hypotension 07/28/2017   On home oxygen  therapy    Migraines    Left bundle branch block    Hypothyroidism    Hypertension    Hyperlipidemia    Heart murmur    GERD (gastroesophageal reflux disease)    Exertional shortness  of breath    Major depressive disorder    Back pain    Arthritis    Generalized anxiety disorder    Anemia    Chronic respiratory failure 09/14/2013   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   Chronic systolic heart failure 10/27/2012   Endometrial polyp 01/20/2012   Malignant tumor of breast 03/26/2011   Vitamin D  deficiency 03/26/2011   Current  Medications[1] Allergies[2]   Social History   Socioeconomic History   Marital status: Married    Spouse name: [place]   Number of children: 2   Years of education: 18   Highest education level: Master's degree (e.g., MA, MS, MEng, MEd, MSW, MBA)  Occupational History   Occupation: retired  Tobacco Use   Smoking status: Every Day    Current packs/day: 0.00    Average packs/day: 0.5 packs/day for 26.0 years (13.0 ttl pk-yrs)    Types: Cigarettes    Start date: 03/24/1995    Last attempt to quit: 03/23/2021    Years since quitting: 3.1   Smokeless tobacco: Never  Vaping Use   Vaping status: Never Used  Substance and Sexual Activity   Alcohol use: No   Drug use: No   Sexual activity: Yes  Other Topics Concern   Not on file  Social History Narrative   Tobacco Use Cigarettes: Former Smoker, Quit in 2008   No Alcohol   No recreational drug use   Diet: Regular/Low Carb   Exercise: None   Occupation: disabled   Education: Company Secretary, masters   Children: 2   Firearms: No   Risk Analyst Use: Always   Former wellsite geologist.    Right handed   Two story home   Lives with spouse [place]      Social Drivers of Health   Tobacco Use: High Risk (05/03/2024)   Patient History    Smoking Tobacco Use: Every Day    Smokeless Tobacco Use: Never    Passive Exposure: Not on file  Financial Resource Strain: Low Risk (09/17/2023)   Overall Financial Resource Strain (CARDIA)    Difficulty of Paying Living Expenses: Not very hard  Food Insecurity: No Food Insecurity (03/06/2024)   Epic    Worried About Programme Researcher, Broadcasting/film/video in the Last Year: Never true    Ran Out of Food in the Last Year: Never true  Transportation Needs: No Transportation Needs (03/06/2024)   Epic    Lack of Transportation (Medical): No    Lack of Transportation (Non-Medical): No  Physical Activity: Insufficiently Active (09/17/2023)   Exercise Vital Sign    Days of Exercise per Week: 1 day    Minutes of Exercise per  Session: 20 min  Stress: Stress Concern Present (09/17/2023)   [name] of Occupational Health - Occupational Stress Questionnaire    Feeling of Stress : To some extent  Social Connections: Socially Integrated (09/17/2023)   Social Connection and Isolation Panel    Frequency of Communication with Friends and Family: Twice a week    Frequency of Social Gatherings with Friends and Family: Twice a week    Attends Religious Services: 1 to 4 times per year    Active Member of [employer] or Organizations: Patient unable to answer    Attends Banker Meetings: 1 to 4 times per year    Marital Status: Married  Catering Manager Violence: Not At Risk (03/06/2024)   Epic    Fear of Current or Ex-Partner: No  Emotionally Abused: No    Physically Abused: No    Sexually Abused: No  Depression (PHQ2-9): Low Risk (09/17/2023)   Depression (PHQ2-9)    PHQ-2 Score: 1  Alcohol Screen: Low Risk (09/17/2023)   Alcohol Screen    Last Alcohol Screening Score (AUDIT): 0  Housing: Unknown (03/06/2024)   Epic    Unable to Pay for Housing in the Last Year: No    Number of Times Moved in the Last Year: Not on file    Homeless in the Last Year: No  Utilities: Not At Risk (03/06/2024)   Epic    Threatened with loss of utilities: No  Health Literacy: Inadequate Health Literacy (09/17/2023)   B1300 Health Literacy    Frequency of need for help with medical instructions: Often    Physical Exam      Future Appointments  Date Time Provider Department Center  06/12/2024  7:10 AM [facility] H[facility] DEVICE REMOTES [facility]  06/14/2024  7:15 AM [facility] H[facility] DEVICE REMOTES [facility]  08/02/2024  3:00 PM [facility] PA/NP [facility] None  08/09/2024  4:00 PM Gaynel Delon CROME, DPM [facility]  09/07/2024  3:20 PM Arfeen, Leni DASEN, MD [facility] None  09/11/2024  7:10 AM [facility] H[facility] DEVICE REMOTES [facility]  12/11/2024  7:10 AM [facility] H[facility] DEVICE REMOTES [facility]  03/12/2025  7:10 AM [facility] HVT DEVICE REMOTES  [facility]  05/02/2025  8:50 AM Patel, Donika K, DO [facility] None                 [1]  Current Outpatient Medications:    acetaminophen  (TYLENOL ) 500 MG tablet, Take 500 mg by mouth every 6 (six) hours as needed., Disp: , Rfl:    acetaZOLAMIDE  (DIAMOX ) 125 MG tablet, Take 1 tablet (125 mg total) by mouth daily., Disp: 90 tablet, Rfl: 3   albuterol  (VENTOLIN  HFA) 108 (90 Base) MCG/ACT inhaler, INHALE TWO PUFFS BY MOUTH EVERY 4 HOURS AS NEEDED, Disp: , Rfl:    allopurinol  (ZYLOPRIM ) 100 MG tablet, Take 0.5 tablets (50 mg total) by mouth daily., Disp: 45 tablet, Rfl: 2   azelastine  (ASTELIN ) 0.1 % nasal spray, Place 2 sprays into both nostrils 2 (two) times daily. Use in each nostril as directed, Disp: 30 mL, Rfl: 12   buPROPion  (WELLBUTRIN  XL) 150 MG 24 hr tablet, Take 1 tablet (150 mg total) by mouth daily., Disp: 30 tablet, Rfl: 5   Capsaicin 0.1 % CREA, 1 application as needed Externally Three times a day as needed, Disp: , Rfl:    carvedilol  (COREG ) 3.125 MG tablet, TAKE 1 TABLET (3.125 MG) BY MOUTH TWICE DAILY WITH MEALS, Disp: 180 tablet, Rfl: 3   citalopram  (CELEXA ) 20 MG tablet, Take 1 tablet (20 mg total) by mouth daily., Disp: 30 tablet, Rfl: 5   clonazePAM  (KLONOPIN ) 0.5 MG tablet, Take 1 tablet (0.5 mg total) by mouth at bedtime., Disp: 30 tablet, Rfl: 5   cyanocobalamin  (VITAMIN B12) 1000 MCG tablet, Take 1,000 mcg by mouth daily., Disp: , Rfl:    desloratadine  (CLARINEX ) 5 MG tablet, TAKE 1 TABLET (5 MG TOTAL) BY MOUTH DAILY FOR ALLERGIES, Disp: 90 tablet, Rfl: 3   dicyclomine  (BENTYL ) 20 MG tablet, TAKE 1 TABLET BY MOUTH THREE TIMES DAILY 30 MINUTES BEFORE EATING, Disp: , Rfl:    famotidine (PEPCID) 20 MG tablet, Take 20 mg by mouth as needed for heartburn or indigestion., Disp: , Rfl:    ferrous sulfate  325 (65 FE) MG tablet, 1 tablet Orally Once  a day, Disp: , Rfl:    fluticasone  (FLONASE ) 50 MCG/ACT nasal spray, Place 1 spray into both nostrils daily., Disp: , Rfl:     Furosemide  (FUROSCIX ) 80 MG/10ML CTKT, Inject 80 mg into the skin daily as needed. As directed by the heart failure clinic, Disp: 5 each, Rfl: 1   gabapentin  (NEURONTIN ) 100 MG capsule, Take 1 capsule (100 mg total) by mouth 2 (two) times daily., Disp: 180 capsule, Rfl: 3   ipratropium (ATROVENT ) 0.03 % nasal spray, , Disp: , Rfl:    levothyroxine  (SYNTHROID ) 88 MCG tablet, TAKE 1 TABLET(88 MCG) BY MOUTH DAILY BEFORE BREAKFAST, Disp: 90 tablet, Rfl: 3   memantine  (NAMENDA ) 5 MG tablet, Take 5 mg by mouth 2 (two) times daily., Disp: , Rfl:    metolazone  (ZAROXOLYN ) 2.5 MG tablet, TAKE 1 TABLET BY MOUTH ONCE A WEEK, Disp: 4 tablet, Rfl: 1   midodrine  (PROAMATINE ) 5 MG tablet, Take 1 tablet (5 mg total) by mouth 3 (three) times daily., Disp: 200 tablet, Rfl: 3   Multiple Vitamin (MULTIVITAMIN WITH MINERALS) TABS tablet, Take 1 tablet by mouth daily., Disp: , Rfl:    phenol (CHLORASEPTIC) 1.4 % LIQD, Use as directed 1 spray in the mouth or throat as needed for throat irritation / pain., Disp: , Rfl:    potassium chloride  SA (KLOR-CON  M) 20 MEQ tablet, Take 3 tablets (60 mEq total) by mouth 2 (two) times daily., Disp: 572 tablet, Rfl: 3   risperiDONE  (RISPERDAL ) 0.5 MG tablet, Take 1 tablet (0.5 mg total) by mouth 2 (two) times daily at 8 am and 4 pm., Disp: 60 tablet, Rfl: 5   simvastatin  (ZOCOR ) 10 MG tablet, Take 1 tablet (10 mg total) by mouth every evening., Disp: 90 tablet, Rfl: 3   spironolactone  (ALDACTONE ) 25 MG tablet, TAKE 1 TABLET (25 MG TOTAL) BY MOUTH EVERY EVENING., Disp: 90 tablet, Rfl: 3   tirzepatide  (MOUNJARO ) 12.5 MG/0.5ML Pen, INJECT 12.5 MG INTO THE SKIN ONCE A WEEK., Disp: 2 mL, Rfl: 3   torsemide  (DEMADEX ) 100 MG tablet, TAKE 1 TABLET BY MOUTH 2 TIMES DAILY., Disp: 180 tablet, Rfl: 3 [2]  Allergies Allergen Reactions   Ceftin Anaphylaxis    Face and throat swell    Cefuroxime Axetil Anaphylaxis    Face and throat swell   Geodon [Ziprasidone Hcl] Hives   Lisinopril  Other  (See Comments)    angioedema  lisinopril   Other Reaction(s): angioedema   Shellfish Allergy Other (See Comments)    Gout exacerbation  shellfish derived   Shellfish Protein-Containing Drug Products Other (See Comments)    Other reaction(s): Other  shellfish derived   Cefuroxime     Other reaction(s): anaphylaxis  Other Reaction(s): Unknown   Sulfacetamide Sodium-Sulfur      Other reaction(s): itch   Ziprasidone     Other reaction(s): hives on left arm  Other Reaction(s): hives on left arm   Allopurinol  Nausea Only and Other (See Comments)    weakness  Other Reaction(s): nausea, weakness   Diazepam  Other (See Comments)    Patient states that diazepam  doesn't relax, it has the opposite effect.  Valium    Lorazepam  Itching   Sulfa Antibiotics Itching

## 2024-05-31 ENCOUNTER — Other Ambulatory Visit (HOSPITAL_COMMUNITY): Payer: Self-pay

## 2024-05-31 NOTE — Progress Notes (Signed)
 Paramedicine Encounter    Patient ID: Rhonda [name], female    DOB: 02/10/1951, 74 y.o.   MRN: [id number]  Compliance with meds- one morning dose missed and one noon dose missed   Pill box filled- for one week   Refills needed- citalpram and levothyroxine    Meds changes since last visit- none     Social changes- sees PCP today    VISIT SUMMARY- med rec only- she was getting dressed and ready to leave to see PCP. We will resume regular visits next week.      ACTION: Home visit completed     Patient Care Team: Chrystal Lamarr GORMAN, MD as PCP - General (Family Medicine) Waddell Danelle ORN, MD as PCP - Electrophysiology (Cardiology) Cathern Andriette DEL, LCSW as Social Worker (Licensed Clinical Social Worker) Tobie, Tonita POUR, DO as Consulting Physician (Neurology) Jacques Moats, Paramedic as Paramedic Chrystal, Lamarr GORMAN, MD as Attending Physician (Family Medicine) Bensimhon, Toribio SAUNDERS, MD as Consulting Physician (Cardiology) Curry Leni DASEN, MD as Consulting Physician (Psychiatry) Tobie Franky SQUIBB, DPM as Consulting Physician (Podiatry) Aniceto Daphne CROME, NP as Nurse Practitioner (Cardiology) Gaynel Delon CROME, DPM as Consulting Physician (Podiatry)  Patient Active Problem List   Diagnosis Date Noted   Mild dementia 04/20/2023   Diabetic neuropathy    Elevated troponin 12/02/2021   Acute left ankle pain 10/11/2021   Hyponatremia 10/08/2021   Decreased estrogen level 08/01/2021   Hyperparathyroidism 08/01/2021   Spondylolisthesis 08/01/2021   Varicose veins of bilateral lower extremities with other complications 08/01/2021   Lumbago without sciatica 04/30/2021   Abnormal gait 06/11/2020   Allergic rhinitis 06/11/2020   Asthma 06/11/2020   Benign intracranial hypertension 06/11/2020   Body mass index (BMI) 45.0-49.9, adult 06/11/2020   Bowel incontinence 06/11/2020   Cholelithiasis 06/11/2020   Chronic sinusitis 06/11/2020   Cleft palate 06/11/2020   Daytime somnolence  06/11/2020   Edema 06/11/2020   Family history of malignant neoplasm of gastrointestinal tract 06/11/2020   Gout 06/11/2020   Insomnia 06/11/2020   Atrophic gastritis 06/11/2020   Lumbar spondylosis with myelopathy 06/11/2020   Macrocytosis 06/11/2020   History of colonic polyps 06/11/2020   Repeated falls 06/11/2020   Irritable bowel syndrome 01/04/2020   Spinal stenosis of lumbar region 01/03/2020   Bilateral leg weakness 08/01/2019   Type II diabetes mellitus 06/15/2019   Hypokalemia 11/08/2018   CKD (chronic kidney disease) stage 3, GFR 30-59 ml/min 11/08/2018   Orthostatic hypotension 07/28/2017   On home oxygen  therapy    Migraines    Left bundle branch block    Hypothyroidism    Hypertension    Hyperlipidemia    Heart murmur    GERD (gastroesophageal reflux disease)    Exertional shortness of breath    Major depressive disorder    Back pain    Arthritis    Generalized anxiety disorder    Anemia    Chronic respiratory failure 09/14/2013   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   Chronic systolic heart failure 10/27/2012   Endometrial polyp 01/20/2012   Malignant tumor of breast 03/26/2011   Vitamin D  deficiency 03/26/2011   Current Medications[1] Allergies[2]   Social History   Socioeconomic History   Marital status: Married    Spouse name: [place]   Number of children: 2   Years of education: 18   Highest education level: Master's degree (e.g., MA, MS, MEng, MEd, MSW, MBA)  Occupational History   Occupation: retired  Tobacco Use   Smoking status:  Every Day    Current packs/day: 0.00    Average packs/day: 0.5 packs/day for 26.0 years (13.0 ttl pk-yrs)    Types: Cigarettes    Start date: 03/24/1995    Last attempt to quit: 03/23/2021    Years since quitting: 3.1   Smokeless tobacco: Never  Vaping Use   Vaping status: Never Used  Substance and Sexual Activity   Alcohol use: No   Drug use: No   Sexual  activity: Yes  Other Topics Concern   Not on file  Social History Narrative   Tobacco Use Cigarettes: Former Smoker, Quit in 2008   No Alcohol   No recreational drug use   Diet: Regular/Low Carb   Exercise: None   Occupation: disabled   Education: Company Secretary, masters   Children: 2   Firearms: No   Risk Analyst Use: Always   Former wellsite geologist.    Right handed   Two story home   Lives with spouse [place]      Social Drivers of Health   Tobacco Use: High Risk (05/03/2024)   Patient History    Smoking Tobacco Use: Every Day    Smokeless Tobacco Use: Never    Passive Exposure: Not on file  Financial Resource Strain: Low Risk (09/17/2023)   Overall Financial Resource Strain (CARDIA)    Difficulty of Paying Living Expenses: Not very hard  Food Insecurity: No Food Insecurity (03/06/2024)   Epic    Worried About Programme Researcher, Broadcasting/film/video in the Last Year: Never true    Ran Out of Food in the Last Year: Never true  Transportation Needs: No Transportation Needs (03/06/2024)   Epic    Lack of Transportation (Medical): No    Lack of Transportation (Non-Medical): No  Physical Activity: Insufficiently Active (09/17/2023)   Exercise Vital Sign    Days of Exercise per Week: 1 day    Minutes of Exercise per Session: 20 min  Stress: Stress Concern Present (09/17/2023)   [name] of Occupational Health - Occupational Stress Questionnaire    Feeling of Stress : To some extent  Social Connections: Socially Integrated (09/17/2023)   Social Connection and Isolation Panel    Frequency of Communication with Friends and Family: Twice a week    Frequency of Social Gatherings with Friends and Family: Twice a week    Attends Religious Services: 1 to 4 times per year    Active Member of Clubs or Organizations: Patient unable to answer    Attends Banker Meetings: 1 to 4 times per year    Marital Status: Married  Catering Manager Violence: Not At Risk (03/06/2024)   Epic    Fear of  Current or Ex-Partner: No    Emotionally Abused: No    Physically Abused: No    Sexually Abused: No  Depression (PHQ2-9): Low Risk (09/17/2023)   Depression (PHQ2-9)    PHQ-2 Score: 1  Alcohol Screen: Low Risk (09/17/2023)   Alcohol Screen    Last Alcohol Screening Score (AUDIT): 0  Housing: Unknown (03/06/2024)   Epic    Unable to Pay for Housing in the Last Year: No    Number of Times Moved in the Last Year: Not on file    Homeless in the Last Year: No  Utilities: Not At Risk (03/06/2024)   Epic    Threatened with loss of utilities: No  Health Literacy: Inadequate Health Literacy (09/17/2023)   B1300 Health Literacy    Frequency of need for help with  medical instructions: Often    Physical Exam      Future Appointments  Date Time Provider Department Center  06/12/2024  7:10 AM [facility] DEVICE REMOTES [facility]  06/14/2024  7:15 AM [facility] DEVICE REMOTES [facility]  08/02/2024  3:00 PM [facility] PA/NP [facility] None  08/09/2024  4:00 PM Gaynel Delon CROME, DPM [facility]  09/07/2024  3:20 PM Arfeen, Leni DASEN, MD [facility] None  09/11/2024  7:10 AM [facility] DEVICE REMOTES [facility]  12/11/2024  7:10 AM [facility] DEVICE REMOTES [facility]  03/12/2025  7:10 AM [facility] DEVICE REMOTES [facility]  05/02/2025  8:50 AM Patel, Donika K, DO [facility] None                   [1]  Current Outpatient Medications:    acetaminophen  (TYLENOL ) 500 MG tablet, Take 500 mg by mouth every 6 (six) hours as needed., Disp: , Rfl:    acetaZOLAMIDE  (DIAMOX ) 125 MG tablet, Take 1 tablet (125 mg total) by mouth daily., Disp: 90 tablet, Rfl: 3   albuterol  (VENTOLIN  HFA) 108 (90 Base) MCG/ACT inhaler, INHALE TWO PUFFS BY MOUTH EVERY 4 HOURS AS NEEDED, Disp: , Rfl:    allopurinol  (ZYLOPRIM ) 100 MG tablet, Take 0.5 tablets (50 mg total) by mouth daily., Disp: 45 tablet, Rfl: 2   azelastine  (ASTELIN ) 0.1 % nasal spray, Place 2 sprays into both nostrils 2 (two) times daily. Use in  each nostril as directed, Disp: 30 mL, Rfl: 12   buPROPion  (WELLBUTRIN  XL) 150 MG 24 hr tablet, Take 1 tablet (150 mg total) by mouth daily., Disp: 30 tablet, Rfl: 5   Capsaicin 0.1 % CREA, 1 application as needed Externally Three times a day as needed, Disp: , Rfl:    carvedilol  (COREG ) 3.125 MG tablet, TAKE 1 TABLET (3.125 MG) BY MOUTH TWICE DAILY WITH MEALS, Disp: 180 tablet, Rfl: 3   citalopram  (CELEXA ) 20 MG tablet, Take 1 tablet (20 mg total) by mouth daily., Disp: 30 tablet, Rfl: 5   clonazePAM  (KLONOPIN ) 0.5 MG tablet, Take 1 tablet (0.5 mg total) by mouth at bedtime., Disp: 30 tablet, Rfl: 5   cyanocobalamin  (VITAMIN B12) 1000 MCG tablet, Take 1,000 mcg by mouth daily., Disp: , Rfl:    desloratadine  (CLARINEX ) 5 MG tablet, TAKE 1 TABLET (5 MG TOTAL) BY MOUTH DAILY FOR ALLERGIES, Disp: 90 tablet, Rfl: 3   dicyclomine  (BENTYL ) 20 MG tablet, TAKE 1 TABLET BY MOUTH THREE TIMES DAILY 30 MINUTES BEFORE EATING, Disp: , Rfl:    famotidine (PEPCID) 20 MG tablet, Take 20 mg by mouth as needed for heartburn or indigestion., Disp: , Rfl:    ferrous sulfate  325 (65 FE) MG tablet, 1 tablet Orally Once a day, Disp: , Rfl:    fluticasone  (FLONASE ) 50 MCG/ACT nasal spray, Place 1 spray into both nostrils daily., Disp: , Rfl:    Furosemide  (FUROSCIX ) 80 MG/10ML CTKT, Inject 80 mg into the skin daily as needed. As directed by the heart failure clinic, Disp: 5 each, Rfl: 1   gabapentin  (NEURONTIN ) 100 MG capsule, Take 1 capsule (100 mg total) by mouth 2 (two) times daily., Disp: 180 capsule, Rfl: 3   ipratropium (ATROVENT ) 0.03 % nasal spray, , Disp: , Rfl:    levothyroxine  (SYNTHROID ) 88 MCG tablet, TAKE 1 TABLET(88 MCG) BY MOUTH DAILY BEFORE BREAKFAST, Disp: 90 tablet, Rfl: 3   memantine  (NAMENDA ) 5 MG tablet, Take 5 mg by mouth 2 (two) times daily., Disp: , Rfl:  metolazone  (ZAROXOLYN ) 2.5 MG tablet, TAKE 1 TABLET BY MOUTH ONCE A WEEK, Disp: 4 tablet, Rfl: 1   midodrine  (PROAMATINE ) 5 MG tablet, Take 1  tablet (5 mg total) by mouth 3 (three) times daily., Disp: 200 tablet, Rfl: 3   Multiple Vitamin (MULTIVITAMIN WITH MINERALS) TABS tablet, Take 1 tablet by mouth daily., Disp: , Rfl:    phenol (CHLORASEPTIC) 1.4 % LIQD, Use as directed 1 spray in the mouth or throat as needed for throat irritation / pain., Disp: , Rfl:    potassium chloride  SA (KLOR-CON  M) 20 MEQ tablet, Take 3 tablets (60 mEq total) by mouth 2 (two) times daily., Disp: 572 tablet, Rfl: 3   risperiDONE  (RISPERDAL ) 0.5 MG tablet, Take 1 tablet (0.5 mg total) by mouth 2 (two) times daily at 8 am and 4 pm., Disp: 60 tablet, Rfl: 5   simvastatin  (ZOCOR ) 10 MG tablet, Take 1 tablet (10 mg total) by mouth every evening., Disp: 90 tablet, Rfl: 3   spironolactone  (ALDACTONE ) 25 MG tablet, TAKE 1 TABLET (25 MG TOTAL) BY MOUTH EVERY EVENING., Disp: 90 tablet, Rfl: 3   tirzepatide  (MOUNJARO ) 12.5 MG/0.5ML Pen, INJECT 12.5 MG INTO THE SKIN ONCE A WEEK., Disp: 2 mL, Rfl: 3   torsemide  (DEMADEX ) 100 MG tablet, TAKE 1 TABLET BY MOUTH 2 TIMES DAILY., Disp: 180 tablet, Rfl: 3 [2]  Allergies Allergen Reactions   Ceftin Anaphylaxis    Face and throat swell    Cefuroxime Axetil Anaphylaxis    Face and throat swell   Geodon [Ziprasidone Hcl] Hives   Lisinopril  Other (See Comments)    angioedema  lisinopril   Other Reaction(s): angioedema   Shellfish Allergy Other (See Comments)    Gout exacerbation  shellfish derived   Shellfish Protein-Containing Drug Products Other (See Comments)    Other reaction(s): Other  shellfish derived   Cefuroxime     Other reaction(s): anaphylaxis  Other Reaction(s): Unknown   Sulfacetamide Sodium-Sulfur      Other reaction(s): itch   Ziprasidone     Other reaction(s): hives on left arm  Other Reaction(s): hives on left arm   Allopurinol  Nausea Only and Other (See Comments)    weakness  Other Reaction(s): nausea, weakness   Diazepam  Other (See Comments)    Patient states that diazepam  doesn't relax,  it has the opposite effect.  Valium    Lorazepam  Itching   Sulfa Antibiotics Itching

## 2024-06-07 ENCOUNTER — Other Ambulatory Visit (HOSPITAL_COMMUNITY): Payer: Self-pay

## 2024-06-08 NOTE — Progress Notes (Signed)
 Paramedicine Encounter    Patient ID: Rhonda [name], female    DOB: Sep 07, 1950, 74 y.o.   MRN: [id number]   Complaints- fatigue with some dizziness and decreased appetite   Assessment- CAOX4, warm and dry, no edema in lower legs, lungs clear, vitals within normal.   Compliance with meds- missed two evening doses   Pill box filled- for one week   Refills needed- levothyroxine  ( sat- weds AM)   Meds changes since last visit- none     Social changes- none    VISIT SUMMARY- Arrived for home visit for Rhonda [name] who reports to be feeling dizzy and fatigued. I obtained vitals- all within normal limits. Not positive for orthostatic's. She says she has had a decreased appetite also. She is taking weekly mounjaro . Meds reviewed and confirmed. She missed two evening doses of meds. I filled pill box for one week. Refills called into [facility]. I reviewed upcoming appointments and educated her on HF education with sodium and fluid restrictions. She understood and we plan to meet in one week. Visit complete.   BP 110/70   Pulse 86   Resp 16   Wt 192 lb (87.1 kg)   SpO2 97%   BMI 34.01 kg/m  Weight yesterday-- did not weigh  Last visit weight-- 194lbs      ACTION: Home visit completed     Patient Care Team: Chrystal Lamarr GORMAN, MD as PCP - General (Family Medicine) Waddell Danelle ORN, MD as PCP - Electrophysiology (Cardiology) Cathern Andriette DEL, LCSW as Social Worker (Licensed Clinical Social Worker) Tobie, Tonita POUR, DO as Consulting Physician (Neurology) Jacques Moats, Paramedic as Paramedic Chrystal, Lamarr GORMAN, MD as Attending Physician (Family Medicine) Bensimhon, Toribio SAUNDERS, MD as Consulting Physician (Cardiology) Curry, Leni DASEN, MD as Consulting Physician (Psychiatry) Tobie Franky SQUIBB, DPM as Consulting Physician (Podiatry) Aniceto Daphne CROME, NP as Nurse Practitioner (Cardiology) Gaynel Delon CROME, DPM as Consulting Physician (Podiatry)  Patient Active Problem List   Diagnosis  Date Noted   Mild dementia 04/20/2023   Diabetic neuropathy    Elevated troponin 12/02/2021   Acute left ankle pain 10/11/2021   Hyponatremia 10/08/2021   Decreased estrogen level 08/01/2021   Hyperparathyroidism 08/01/2021   Spondylolisthesis 08/01/2021   Varicose veins of bilateral lower extremities with other complications 08/01/2021   Lumbago without sciatica 04/30/2021   Abnormal gait 06/11/2020   Allergic rhinitis 06/11/2020   Asthma 06/11/2020   Benign intracranial hypertension 06/11/2020   Body mass index (BMI) 45.0-49.9, adult 06/11/2020   Bowel incontinence 06/11/2020   Cholelithiasis 06/11/2020   Chronic sinusitis 06/11/2020   Cleft palate 06/11/2020   Daytime somnolence 06/11/2020   Edema 06/11/2020   Family history of malignant neoplasm of gastrointestinal tract 06/11/2020   Gout 06/11/2020   Insomnia 06/11/2020   Atrophic gastritis 06/11/2020   Lumbar spondylosis with myelopathy 06/11/2020   Macrocytosis 06/11/2020   History of colonic polyps 06/11/2020   Repeated falls 06/11/2020   Irritable bowel syndrome 01/04/2020   Spinal stenosis of lumbar region 01/03/2020   Bilateral leg weakness 08/01/2019   Type II diabetes mellitus 06/15/2019   Hypokalemia 11/08/2018   CKD (chronic kidney disease) stage 3, GFR 30-59 ml/min 11/08/2018   Orthostatic hypotension 07/28/2017   On home oxygen  therapy    Migraines    Left bundle branch block    Hypothyroidism    Hypertension    Hyperlipidemia    Heart murmur    GERD (gastroesophageal reflux disease)    Exertional shortness of breath  Major depressive disorder    Back pain    Arthritis    Generalized anxiety disorder    Anemia    Chronic respiratory failure 09/14/2013   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   Chronic systolic heart failure 10/27/2012   Endometrial polyp 01/20/2012   Malignant tumor of breast 03/26/2011   Vitamin D  deficiency 03/26/2011   Current  Medications[1] Allergies[2]   Social History   Socioeconomic History   Marital status: Married    Spouse name: Rhonda [name]   Number of children: 2   Years of education: 18   Highest education level: Master's degree (e.g., MA, MS, MEng, MEd, MSW, MBA)  Occupational History   Occupation: retired  Tobacco Use   Smoking status: Every Day    Current packs/day: 0.00    Average packs/day: 0.5 packs/day for 26.0 years (13.0 ttl pk-yrs)    Types: Cigarettes    Start date: 03/24/1995    Last attempt to quit: 03/23/2021    Years since quitting: 3.2   Smokeless tobacco: Never  Vaping Use   Vaping status: Never Used  Substance and Sexual Activity   Alcohol use: No   Drug use: No   Sexual activity: Yes  Other Topics Concern   Not on file  Social History Narrative   Tobacco Use Cigarettes: Former Smoker, Quit in 2008   No Alcohol   No recreational drug use   Diet: Regular/Low Carb   Exercise: None   Occupation: disabled   Education: Company Secretary, masters   Children: 2   Firearms: No   Risk Analyst Use: Always   Former wellsite geologist.    Right handed   Two story home   Lives with spouse Rhonda [name]      Social Drivers of Health   Tobacco Use: High Risk (05/03/2024)   Patient History    Smoking Tobacco Use: Every Day    Smokeless Tobacco Use: Never    Passive Exposure: Not on file  Financial Resource Strain: Low Risk (09/17/2023)   Overall Financial Resource Strain (CARDIA)    Difficulty of Paying Living Expenses: Not very hard  Food Insecurity: No Food Insecurity (03/06/2024)   Epic    Worried About Programme Researcher, Broadcasting/film/video in the Last Year: Never true    Ran Out of Food in the Last Year: Never true  Transportation Needs: No Transportation Needs (03/06/2024)   Epic    Lack of Transportation (Medical): No    Lack of Transportation (Non-Medical): No  Physical Activity: Insufficiently Active (09/17/2023)   Exercise Vital Sign    Days of Exercise per Week: 1 day    Minutes of Exercise per  Session: 20 min  Stress: Stress Concern Present (09/17/2023)   [name] of Occupational Health - Occupational Stress Questionnaire    Feeling of Stress : To some extent  Social Connections: Socially Integrated (09/17/2023)   Social Connection and Isolation Panel    Frequency of Communication with Friends and Family: Twice a week    Frequency of Social Gatherings with Friends and Family: Twice a week    Attends Religious Services: 1 to 4 times per year    Active Member of [employer] or Organizations: Patient unable to answer    Attends Banker Meetings: 1 to 4 times per year    Marital Status: Married  Catering Manager Violence: Not At Risk (03/06/2024)   Epic    Fear of Current or Ex-Partner: No    Emotionally Abused: No  Physically Abused: No    Sexually Abused: No  Depression (PHQ2-9): Low Risk (09/17/2023)   Depression (PHQ2-9)    PHQ-2 Score: 1  Alcohol Screen: Low Risk (09/17/2023)   Alcohol Screen    Last Alcohol Screening Score (AUDIT): 0  Housing: Unknown (03/06/2024)   Epic    Unable to Pay for Housing in the Last Year: No    Number of Times Moved in the Last Year: Not on file    Homeless in the Last Year: No  Utilities: Not At Risk (03/06/2024)   Epic    Threatened with loss of utilities: No  Health Literacy: Inadequate Health Literacy (09/17/2023)   B1300 Health Literacy    Frequency of need for help with medical instructions: Often    Physical Exam      Future Appointments  Date Time Provider Department Center  06/12/2024  7:10 AM [facility] HVT DEVICE REMOTES [facility]  06/14/2024  7:15 AM [facility] HVT DEVICE REMOTES [facility]  08/02/2024  3:00 PM [facility] PA/NP [facility] None  08/09/2024  4:00 PM Gaynel Delon CROME, DPM [facility]  09/07/2024  3:20 PM Arfeen, Leni DASEN, MD [facility] None  09/11/2024  7:10 AM [facility] HVT DEVICE REMOTES [facility]  12/11/2024  7:10 AM [facility] H[facility] DEVICE REMOTES [facility]  03/12/2025  7:10 AM [facility] HVT DEVICE REMOTES  [facility]  05/02/2025  8:50 AM Patel, Donika K, DO [facility] None                 [1]  Current Outpatient Medications:    acetaminophen  (TYLENOL ) 500 MG tablet, Take 500 mg by mouth every 6 (six) hours as needed., Disp: , Rfl:    acetaZOLAMIDE  (DIAMOX ) 125 MG tablet, Take 1 tablet (125 mg total) by mouth daily., Disp: 90 tablet, Rfl: 3   albuterol  (VENTOLIN  HFA) 108 (90 Base) MCG/ACT inhaler, INHALE TWO PUFFS BY MOUTH EVERY 4 HOURS AS NEEDED, Disp: , Rfl:    allopurinol  (ZYLOPRIM ) 100 MG tablet, Take 0.5 tablets (50 mg total) by mouth daily., Disp: 45 tablet, Rfl: 2   azelastine  (ASTELIN ) 0.1 % nasal spray, Place 2 sprays into both nostrils 2 (two) times daily. Use in each nostril as directed, Disp: 30 mL, Rfl: 12   buPROPion  (WELLBUTRIN  XL) 150 MG 24 hr tablet, Take 1 tablet (150 mg total) by mouth daily., Disp: 30 tablet, Rfl: 5   Capsaicin 0.1 % CREA, 1 application as needed Externally Three times a day as needed, Disp: , Rfl:    carvedilol  (COREG ) 3.125 MG tablet, TAKE 1 TABLET (3.125 MG) BY MOUTH TWICE DAILY WITH MEALS, Disp: 180 tablet, Rfl: 3   citalopram  (CELEXA ) 20 MG tablet, Take 1 tablet (20 mg total) by mouth daily., Disp: 30 tablet, Rfl: 5   clonazePAM  (KLONOPIN ) 0.5 MG tablet, Take 1 tablet (0.5 mg total) by mouth at bedtime., Disp: 30 tablet, Rfl: 5   cyanocobalamin  (VITAMIN B12) 1000 MCG tablet, Take 1,000 mcg by mouth daily., Disp: , Rfl:    desloratadine  (CLARINEX ) 5 MG tablet, TAKE 1 TABLET (5 MG TOTAL) BY MOUTH DAILY FOR ALLERGIES, Disp: 90 tablet, Rfl: 3   dicyclomine  (BENTYL ) 20 MG tablet, TAKE 1 TABLET BY MOUTH THREE TIMES DAILY 30 MINUTES BEFORE EATING, Disp: , Rfl:    famotidine (PEPCID) 20 MG tablet, Take 20 mg by mouth as needed for heartburn or indigestion., Disp: , Rfl:    ferrous sulfate  325 (65 FE) MG tablet, 1 tablet Orally Once a day, Disp: , Rfl:  fluticasone  (FLONASE ) 50 MCG/ACT nasal spray, Place 1 spray into both nostrils daily., Disp: , Rfl:     Furosemide  (FUROSCIX ) 80 MG/10ML CTKT, Inject 80 mg into the skin daily as needed. As directed by the heart failure clinic, Disp: 5 each, Rfl: 1   gabapentin  (NEURONTIN ) 100 MG capsule, Take 1 capsule (100 mg total) by mouth 2 (two) times daily., Disp: 180 capsule, Rfl: 3   ipratropium (ATROVENT ) 0.03 % nasal spray, , Disp: , Rfl:    levothyroxine  (SYNTHROID ) 88 MCG tablet, TAKE 1 TABLET(88 MCG) BY MOUTH DAILY BEFORE BREAKFAST, Disp: 90 tablet, Rfl: 3   memantine  (NAMENDA ) 5 MG tablet, Take 5 mg by mouth 2 (two) times daily., Disp: , Rfl:    metolazone  (ZAROXOLYN ) 2.5 MG tablet, TAKE 1 TABLET BY MOUTH ONCE A WEEK, Disp: 4 tablet, Rfl: 1   midodrine  (PROAMATINE ) 5 MG tablet, Take 1 tablet (5 mg total) by mouth 3 (three) times daily., Disp: 200 tablet, Rfl: 3   Multiple Vitamin (MULTIVITAMIN WITH MINERALS) TABS tablet, Take 1 tablet by mouth daily., Disp: , Rfl:    phenol (CHLORASEPTIC) 1.4 % LIQD, Use as directed 1 spray in the mouth or throat as needed for throat irritation / pain., Disp: , Rfl:    potassium chloride  SA (KLOR-CON  M) 20 MEQ tablet, Take 3 tablets (60 mEq total) by mouth 2 (two) times daily., Disp: 572 tablet, Rfl: 3   risperiDONE  (RISPERDAL ) 0.5 MG tablet, Take 1 tablet (0.5 mg total) by mouth 2 (two) times daily at 8 am and 4 pm., Disp: 60 tablet, Rfl: 5   simvastatin  (ZOCOR ) 10 MG tablet, Take 1 tablet (10 mg total) by mouth every evening., Disp: 90 tablet, Rfl: 3   spironolactone  (ALDACTONE ) 25 MG tablet, TAKE 1 TABLET (25 MG TOTAL) BY MOUTH EVERY EVENING., Disp: 90 tablet, Rfl: 3   tirzepatide  (MOUNJARO ) 12.5 MG/0.5ML Pen, INJECT 12.5 MG INTO THE SKIN ONCE A WEEK., Disp: 2 mL, Rfl: 3   torsemide  (DEMADEX ) 100 MG tablet, TAKE 1 TABLET BY MOUTH 2 TIMES DAILY., Disp: 180 tablet, Rfl: 3 [2]  Allergies Allergen Reactions   Ceftin Anaphylaxis    Face and throat swell    Cefuroxime Axetil Anaphylaxis    Face and throat swell   Geodon [Ziprasidone Hcl] Hives   Lisinopril  Other  (See Comments)    angioedema  lisinopril   Other Reaction(s): angioedema   Shellfish Allergy Other (See Comments)    Gout exacerbation  shellfish derived   Shellfish Protein-Containing Drug Products Other (See Comments)    Other reaction(s): Other  shellfish derived   Cefuroxime     Other reaction(s): anaphylaxis  Other Reaction(s): Unknown   Sulfacetamide Sodium-Sulfur      Other reaction(s): itch   Ziprasidone     Other reaction(s): hives on left arm  Other Reaction(s): hives on left arm   Allopurinol  Nausea Only and Other (See Comments)    weakness  Other Reaction(s): nausea, weakness   Diazepam  Other (See Comments)    Patient states that diazepam  doesn't relax, it has the opposite effect.  Valium    Lorazepam  Itching   Sulfa Antibiotics Itching

## 2024-06-12 ENCOUNTER — Ambulatory Visit: Payer: Medicare HMO

## 2024-06-12 DIAGNOSIS — I5022 Chronic systolic (congestive) heart failure: Secondary | ICD-10-CM

## 2024-06-14 ENCOUNTER — Telehealth (HOSPITAL_COMMUNITY): Payer: Self-pay

## 2024-06-14 ENCOUNTER — Ambulatory Visit: Attending: Cardiology

## 2024-06-14 ENCOUNTER — Other Ambulatory Visit (HOSPITAL_COMMUNITY): Payer: Self-pay

## 2024-06-14 DIAGNOSIS — Z95810 Presence of automatic (implantable) cardiac defibrillator: Secondary | ICD-10-CM | POA: Diagnosis not present

## 2024-06-14 DIAGNOSIS — I5022 Chronic systolic (congestive) heart failure: Secondary | ICD-10-CM

## 2024-06-14 LAB — CUP PACEART REMOTE DEVICE CHECK
Battery Remaining Longevity: 30 mo
Battery Voltage: 2.95 V
Brady Statistic AP VP Percent: 3.13 %
Brady Statistic AP VS Percent: 0.01 %
Brady Statistic AS VP Percent: 96.84 %
Brady Statistic AS VS Percent: 0.01 %
Brady Statistic RA Percent Paced: 3.13 %
Brady Statistic RV Percent Paced: 99.96 %
Date Time Interrogation Session: 20260119001803
HighPow Impedance: 87 Ohm
Implantable Lead Connection Status: 753985
Implantable Lead Connection Status: 753985
Implantable Lead Connection Status: 753985
Implantable Lead Implant Date: 20141210
Implantable Lead Implant Date: 20141210
Implantable Lead Implant Date: 20141210
Implantable Lead Location: 753858
Implantable Lead Location: 753859
Implantable Lead Location: 753860
Implantable Lead Model: 4396
Implantable Lead Model: 5076
Implantable Lead Model: 6935
Implantable Pulse Generator Implant Date: 20211118
Lead Channel Impedance Value: 418 Ohm
Lead Channel Impedance Value: 456 Ohm
Lead Channel Impedance Value: 475 Ohm
Lead Channel Impedance Value: 608 Ohm
Lead Channel Impedance Value: 703 Ohm
Lead Channel Impedance Value: 722 Ohm
Lead Channel Pacing Threshold Amplitude: 0.625 V
Lead Channel Pacing Threshold Amplitude: 0.875 V
Lead Channel Pacing Threshold Amplitude: 1.25 V
Lead Channel Pacing Threshold Pulse Width: 0.4 ms
Lead Channel Pacing Threshold Pulse Width: 0.4 ms
Lead Channel Pacing Threshold Pulse Width: 0.4 ms
Lead Channel Sensing Intrinsic Amplitude: 27.625 mV
Lead Channel Sensing Intrinsic Amplitude: 27.625 mV
Lead Channel Sensing Intrinsic Amplitude: 3.5 mV
Lead Channel Sensing Intrinsic Amplitude: 3.5 mV
Lead Channel Setting Pacing Amplitude: 1.5 V
Lead Channel Setting Pacing Amplitude: 2 V
Lead Channel Setting Pacing Amplitude: 2.5 V
Lead Channel Setting Pacing Pulse Width: 0.4 ms
Lead Channel Setting Pacing Pulse Width: 0.4 ms
Lead Channel Setting Sensing Sensitivity: 0.3 mV
Zone Setting Status: 755011
Zone Setting Status: 755011

## 2024-06-14 NOTE — Progress Notes (Signed)
 EPIC Encounter for ICM Monitoring  Patient Name: Rhonda [name] is a 74 y.o. female Date: 06/14/2024 Primary Care Physican: Rhonda Lamarr GORMAN, MD Primary Cardiologist: Rhonda [name] Electrophysiologist: Rhonda [name]:  100%   06/01/2022 Weight: 193 lbs  04/26/2023 Weight: 214 lbs Per EMT note 07/14/2023 Weight: 226 lbs 10/13/2023 Office Weight: 210 lbs 12/15/2023 Weight: 206.3 lbs per Rhonda [name], paramedic visit 01/12/2024 Weight: 205.8 lbs per Rhonda [name], paramedic visit  01/19/2024 Weight: 203 lbs 02/10/2024 Weight: 202 lbs per Rhonda [name] paramedicine visit 03/22/2024 Weight: 205 lbs per Rhonda [name] paramedicine visit 04/12/2024 Weight: 198.9 per Rhonda [name] paramedicine visit   Clinical Status Since 10-May-2024 Time in AT/AF <0.1 hr/day (<0.1%)          Transmission results reviewed and provided to Rhonda [name], EMT with paramedicine program.   Rhonda has a home visit scheduled for 06/14/2024.                Since 05/10/2024 ICM Remote Transmission: Optivol thoracic impedance suggesting possible fluid accumulation starting 05/30/2023.   Prescribed:  Rhonda [name] with paramedicine program assists with meds.   Torsemide  100 mg 1 tablet(s) (100 mg total) by mouth twice a day Potassium 20 meq 3 tablets (60 mEq total) by mouth 2 (twice) a day.   Metolazone  2.5 mg 1 tablet by mouth once a week (every Tuesday).  Per 05/03/2024 HF clinic ov note: OK to take extra metolazone  dose PRN (requiring extra dose during the week about 1x/month)  Spironolactone  25 mg take 1 tablet by mouth every evening.   Labs: 05/03/2024 Creatinine 2.73, BUN 47, Potassium 4.0, Sodium 139, GFR 18 03/16/2024 Creatinine 2.26, BUN 49, Potassium 3.5, Sodium 134, GFR 23 03/01/2024 Creatinine 2.56, BUN 53, Potassium 4.5, Sodium 132, GFR 16 02/23/2024 Creatinine 2.29, BUN 46, Potassium 3.5, Sodium 134, GFR 22 01/26/2024 Creatinine 1.99, BUN 38, Potassium 3.7, Sodium 135, GFR 26, BNP  29.3 10/25/2023 Creatinine 1.78, BUN 46, Potassium 3.3, Sodium 136, GFR 30 10/13/2023 Creatinine 1.91, BUN 41, Potassium 3.5, Sodium 135, GFR 28, BNP 37.5 07/28/2023 Creatinine 2.06, BUN 52, Potassium 3.5, Sodium 135, eGFR 25 07/14/2023 Creatinine 1.91, BUN 47, Potassium 3.3, Sodium 134, eGFR 28, BNP 71.5 A complete set of results can be found in Results Review.   Recommendations:  Rhonda [name], [name] sent message to HF clinic triage for recommendations.      Follow-up plan: ICM clinic phone appointment on 06/21/2024 to check fluid levels.   91 day device clinic remote transmission 09/11/2024.     EP/Cardiology Office Visits:   08/02/2024 with HF Clinic.   Recall 05/11/2024 with Dr Cherrie.  Recall 03/21/2024 with Dr Waddell.     Copy of ICM check sent to Dr. Kennyth.    Remote monitoring is medically necessary for Heart Failure Management.    Daily Thoracic Impedance ICM trend: 03/15/2024 through 06/14/2024.    12-14 Month Thoracic Impedance ICM trend:     Rhonda [name] Garner, RN 06/14/2024 7:13 AM

## 2024-06-14 NOTE — Telephone Encounter (Signed)
 Saw Mrs. Lords in the home today following a phone call with [name], RN who evaluated her Optivol indicating fluid accumulation. Today her weight is down 5 lbs, no lower leg edema, no abdominal distention, lungs clear, no JVD. She has no shortness of breath or shortness of breath on exertion. She has only missed one evening dose of meds this week on Monday.    Vitals-  WT today- 187lbs  Last WT- 192lbs  BP- 122/80  HR- 76  02- 97  RR- 16   Sending to triage for follow up.   [name], EMT-Paramedic [phone] 06/14/2024

## 2024-06-14 NOTE — Progress Notes (Signed)
 Paramedicine Encounter    Patient ID: Rhonda [name], female    DOB: December 17, 1950, 74 y.o.   MRN: [id number]   Complaints- headache   Assessment- CAOx4, warm and dry ambulatory without shortness of breath, no dizziness, no chest pain, no lower leg edema, lungs clear, weight down 5 lbs, optivol showing fluid uptick per laurie short, RN sent to triage   Compliance with meds- missed one evening   Pill box filled- for one week   Refills needed- wellbutrin , risperidone    Meds changes since last visit- AS OF 1600 PER Rhonda [name] TAKE ONE EXTRA METOLAZONE  AND 40 OF POTASSIUM TODAY     Social changes- NONE    VISIT SUMMARY- Arrived for home visit for Rhonda [name] who reports to be feeling okay but having headaches constantly. She thinks this is due to not eating or drinking much due to [place]. Rhonda Garner RN with device clinic says her optivol suggests fluid accumulation- I contacted HF clinic and by 1600 RN reports Rhonda [name] ordered her to take an extra Metolazone  and extra 40Meq of potassium today. Patient will take same this evening. Vitals and assessment as noted. Meds reviewed and pill box filled and confirmed. Refills to be called in to [facility]. We reviewed HF education and I plan to see her in one week. She agreed with same. Home visit complete.   BP 122/80   Pulse 76   Resp 16   Wt 187 lb (84.8 kg)   SpO2 97%   BMI 33.13 kg/m  Weight yesterday-- DIDN'T WEIGH  Last visit weight-- 192LBS      ACTION: Home visit completed     Patient Care Team: Rhonda Lamarr GORMAN, MD as PCP - General (Family Medicine) Rhonda Danelle ORN, MD as PCP - Electrophysiology (Cardiology) Rhonda Andriette DEL, LCSW as Social Worker (Licensed Clinical Social Worker) Rhonda, Tonita POUR, DO as Consulting Physician (Neurology) Rhonda [name], Paramedic as Paramedic Rhonda, Lamarr GORMAN, MD as Attending Physician (Family Medicine) Bensimhon, Toribio SAUNDERS, MD as Consulting Physician (Cardiology) Curry, Leni DASEN,  MD as Consulting Physician (Psychiatry) Rhonda [name], DPM as Consulting Physician (Podiatry) Rhonda Daphne CROME, NP as Nurse Practitioner (Cardiology) Rhonda [name], DPM as Consulting Physician (Podiatry)  Patient Active Problem List   Diagnosis Date Noted   Mild dementia 04/20/2023   Diabetic neuropathy    Elevated troponin 12/02/2021   Acute left ankle pain 10/11/2021   Hyponatremia 10/08/2021   Decreased estrogen level 08/01/2021   Hyperparathyroidism 08/01/2021   Spondylolisthesis 08/01/2021   Varicose veins of bilateral lower extremities with other complications 08/01/2021   Lumbago without sciatica 04/30/2021   Abnormal gait 06/11/2020   Allergic rhinitis 06/11/2020   Asthma 06/11/2020   Benign intracranial hypertension 06/11/2020   Body mass index (BMI) 45.0-49.9, adult 06/11/2020   Bowel incontinence 06/11/2020   Cholelithiasis 06/11/2020   Chronic sinusitis 06/11/2020   Cleft palate 06/11/2020   Daytime somnolence 06/11/2020   Edema 06/11/2020   Family history of malignant neoplasm of gastrointestinal tract 06/11/2020   Gout 06/11/2020   Insomnia 06/11/2020   Atrophic gastritis 06/11/2020   Lumbar spondylosis with myelopathy 06/11/2020   Macrocytosis 06/11/2020   History of colonic polyps 06/11/2020   Repeated falls 06/11/2020   Irritable bowel syndrome 01/04/2020   Spinal stenosis of lumbar region 01/03/2020   Bilateral leg weakness 08/01/2019   Type II diabetes mellitus 06/15/2019   Hypokalemia 11/08/2018   CKD (chronic kidney disease) stage 3, GFR 30-59 ml/min 11/08/2018   Orthostatic hypotension 07/28/2017  On home oxygen  therapy    Migraines    Left bundle branch block    Hypothyroidism    Hypertension    Hyperlipidemia    Heart murmur    GERD (gastroesophageal reflux disease)    Exertional shortness of breath    Major depressive disorder    Back pain    Arthritis    Generalized anxiety disorder    Anemia    Chronic respiratory failure  09/14/2013   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   Chronic systolic heart failure 10/27/2012   Endometrial polyp 01/20/2012   Malignant tumor of breast 03/26/2011   Vitamin D  deficiency 03/26/2011   Current Medications[1] Allergies[2]   Social History   Socioeconomic History   Marital status: Married    Spouse name: Rhonda [name]   Number of children: 2   Years of education: 18   Highest education level: Master's degree (e.g., MA, MS, MEng, MEd, MSW, MBA)  Occupational History   Occupation: retired  Tobacco Use   Smoking status: Every Day    Current packs/day: 0.00    Average packs/day: 0.5 packs/day for 26.0 years (13.0 ttl pk-yrs)    Types: Cigarettes    Start date: 03/24/1995    Last attempt to quit: 03/23/2021    Years since quitting: 3.2   Smokeless tobacco: Never  Vaping Use   Vaping status: Never Used  Substance and Sexual Activity   Alcohol use: No   Drug use: No   Sexual activity: Yes  Other Topics Concern   Not on file  Social History Narrative   Tobacco Use Cigarettes: Former Smoker, Quit in 2008   No Alcohol   No recreational drug use   Diet: Regular/Low Carb   Exercise: None   Occupation: disabled   Education: Company Secretary, masters   Children: 2   Firearms: No   Risk Analyst Use: Always   Former wellsite geologist.    Right handed   Two story home   Lives with spouse Rhonda [name]      Social Drivers of Health   Tobacco Use: High Risk (05/03/2024)   Patient History    Smoking Tobacco Use: Every Day    Smokeless Tobacco Use: Never    Passive Exposure: Not on file  Financial Resource Strain: Low Risk (09/17/2023)   Overall Financial Resource Strain (CARDIA)    Difficulty of Paying Living Expenses: Not very hard  Food Insecurity: No Food Insecurity (03/06/2024)   Epic    Worried About Programme Researcher, Broadcasting/film/video in the Last Year: Never true    Ran Out of Food in the Last Year: Never true  Transportation Needs: No Transportation  Needs (03/06/2024)   Epic    Lack of Transportation (Medical): No    Lack of Transportation (Non-Medical): No  Physical Activity: Insufficiently Active (09/17/2023)   Exercise Vital Sign    Days of Exercise per Week: 1 day    Minutes of Exercise per Session: 20 min  Stress: Stress Concern Present (09/17/2023)   [name] of Occupational Health - Occupational Stress Questionnaire    Feeling of Stress : To some extent  Social Connections: Socially Integrated (09/17/2023)   Social Connection and Isolation Panel    Frequency of Communication with Friends and Family: Twice a week    Frequency of Social Gatherings with Friends and Family: Twice a week    Attends Religious Services: 1 to 4 times per year    Active Member of Clubs or Organizations: Patient unable to  answer    Attends Club or Organization Meetings: 1 to 4 times per year    Marital Status: Married  Catering Manager Violence: Not At Risk (03/06/2024)   Epic    Fear of Current or Ex-Partner: No    Emotionally Abused: No    Physically Abused: No    Sexually Abused: No  Depression (PHQ2-9): Low Risk (09/17/2023)   Depression (PHQ2-9)    PHQ-2 Score: 1  Alcohol Screen: Low Risk (09/17/2023)   Alcohol Screen    Last Alcohol Screening Score (AUDIT): 0  Housing: Unknown (03/06/2024)   Epic    Unable to Pay for Housing in the Last Year: No    Number of Times Moved in the Last Year: Not on file    Homeless in the Last Year: No  Utilities: Not At Risk (03/06/2024)   Epic    Threatened with loss of utilities: No  Health Literacy: Inadequate Health Literacy (09/17/2023)   B1300 Health Literacy    Frequency of need for help with medical instructions: Often    Physical Exam      Future Appointments  Date Time Provider Department Center  06/21/2024  7:10 AM [facility] DEVICE REMOTES [facility]  08/02/2024  3:00 PM [facility] PA/NP [facility] None  08/09/2024  4:00 PM Rhonda [name], DPM [facility]  09/07/2024   3:20 PM Arfeen, Leni DASEN, MD [facility] None  09/11/2024  7:10 AM [facility] HVT DEVICE REMOTES [facility]  12/11/2024  7:10 AM [facility] H[facility] DEVICE REMOTES [facility]  03/12/2025  7:10 AM [facility] H[facility] DEVICE REMOTES [facility]  05/02/2025  8:50 AM Patel, Donika K, DO [facility] None                 [1]  Current Outpatient Medications:    acetaminophen  (TYLENOL ) 500 MG tablet, Take 500 mg by mouth every 6 (six) hours as needed., Disp: , Rfl:    acetaZOLAMIDE  (DIAMOX ) 125 MG tablet, Take 1 tablet (125 mg total) by mouth daily., Disp: 90 tablet, Rfl: 3   albuterol  (VENTOLIN  HFA) 108 (90 Base) MCG/ACT inhaler, INHALE TWO PUFFS BY MOUTH EVERY 4 HOURS AS NEEDED, Disp: , Rfl:    allopurinol  (ZYLOPRIM ) 100 MG tablet, Take 0.5 tablets (50 mg total) by mouth daily., Disp: 45 tablet, Rfl: 2   azelastine  (ASTELIN ) 0.1 % nasal spray, Place 2 sprays into both nostrils 2 (two) times daily. Use in each nostril as directed, Disp: 30 mL, Rfl: 12   buPROPion  (WELLBUTRIN  XL) 150 MG 24 hr tablet, Take 1 tablet (150 mg total) by mouth daily., Disp: 30 tablet, Rfl: 5   Capsaicin 0.1 % CREA, 1 application as needed Externally Three times a day as needed, Disp: , Rfl:    carvedilol  (COREG ) 3.125 MG tablet, TAKE 1 TABLET (3.125 MG) BY MOUTH TWICE DAILY WITH MEALS, Disp: 180 tablet, Rfl: 3   citalopram  (CELEXA ) 20 MG tablet, Take 1 tablet (20 mg total) by mouth daily., Disp: 30 tablet, Rfl: 5   clonazePAM  (KLONOPIN ) 0.5 MG tablet, Take 1 tablet (0.5 mg total) by mouth at bedtime., Disp: 30 tablet, Rfl: 5   cyanocobalamin  (VITAMIN B12) 1000 MCG tablet, Take 1,000 mcg by mouth daily., Disp: , Rfl:    desloratadine  (CLARINEX ) 5 MG tablet, TAKE 1 TABLET (5 MG TOTAL) BY MOUTH DAILY FOR ALLERGIES, Disp: 90 tablet, Rfl: 3   famotidine (PEPCID) 20 MG tablet, Take 20 mg by mouth as needed for heartburn or indigestion., Disp: , Rfl:    ferrous  sulfate 325 (65 FE) MG tablet, 1 tablet Orally Once a day, Disp: , Rfl:    fluticasone   (FLONASE ) 50 MCG/ACT nasal spray, Place 1 spray into both nostrils daily., Disp: , Rfl:    Furosemide  (FUROSCIX ) 80 MG/10ML CTKT, Inject 80 mg into the skin daily as needed. As directed by the heart failure clinic, Disp: 5 each, Rfl: 1   gabapentin  (NEURONTIN ) 100 MG capsule, Take 1 capsule (100 mg total) by mouth 2 (two) times daily., Disp: 180 capsule, Rfl: 3   ipratropium (ATROVENT ) 0.03 % nasal spray, , Disp: , Rfl:    levothyroxine  (SYNTHROID ) 88 MCG tablet, TAKE 1 TABLET(88 MCG) BY MOUTH DAILY BEFORE BREAKFAST, Disp: 90 tablet, Rfl: 3   memantine  (NAMENDA ) 5 MG tablet, Take 5 mg by mouth 2 (two) times daily., Disp: , Rfl:    metolazone  (ZAROXOLYN ) 2.5 MG tablet, TAKE 1 TABLET BY MOUTH ONCE A WEEK, Disp: 4 tablet, Rfl: 1   midodrine  (PROAMATINE ) 5 MG tablet, Take 1 tablet (5 mg total) by mouth 3 (three) times daily., Disp: 200 tablet, Rfl: 3   Multiple Vitamin (MULTIVITAMIN WITH MINERALS) TABS tablet, Take 1 tablet by mouth daily., Disp: , Rfl:    phenol (CHLORASEPTIC) 1.4 % LIQD, Use as directed 1 spray in the mouth or throat as needed for throat irritation / pain., Disp: , Rfl:    potassium chloride  SA (KLOR-CON  M) 20 MEQ tablet, Take 3 tablets (60 mEq total) by mouth 2 (two) times daily., Disp: 572 tablet, Rfl: 3   risperiDONE  (RISPERDAL ) 0.5 MG tablet, Take 1 tablet (0.5 mg total) by mouth 2 (two) times daily at 8 am and 4 pm., Disp: 60 tablet, Rfl: 5   simvastatin  (ZOCOR ) 10 MG tablet, Take 1 tablet (10 mg total) by mouth every evening., Disp: 90 tablet, Rfl: 3   spironolactone  (ALDACTONE ) 25 MG tablet, TAKE 1 TABLET (25 MG TOTAL) BY MOUTH EVERY EVENING., Disp: 90 tablet, Rfl: 3   tirzepatide  (MOUNJARO ) 12.5 MG/0.5ML Pen, INJECT 12.5 MG INTO THE SKIN ONCE A WEEK., Disp: 2 mL, Rfl: 3   torsemide  (DEMADEX ) 100 MG tablet, TAKE 1 TABLET BY MOUTH 2 TIMES DAILY., Disp: 180 tablet, Rfl: 3   dicyclomine  (BENTYL ) 20 MG tablet, TAKE 1 TABLET BY MOUTH THREE TIMES DAILY 30 MINUTES BEFORE EATING  (Patient not taking: Reported on 06/14/2024), Disp: , Rfl:  [2]  Allergies Allergen Reactions   Ceftin Anaphylaxis    Face and throat swell    Cefuroxime Axetil Anaphylaxis    Face and throat swell   Geodon [Ziprasidone Hcl] Hives   Lisinopril  Other (See Comments)    angioedema  lisinopril   Other Reaction(s): angioedema   Shellfish Allergy Other (See Comments)    Gout exacerbation  shellfish derived   Shellfish Protein-Containing Drug Products Other (See Comments)    Other reaction(s): Other  shellfish derived   Cefuroxime     Other reaction(s): anaphylaxis  Other Reaction(s): Unknown   Sulfacetamide Sodium-Sulfur      Other reaction(s): itch   Ziprasidone     Other reaction(s): hives on left arm  Other Reaction(s): hives on left arm   Allopurinol  Nausea Only and Other (See Comments)    weakness  Other Reaction(s): nausea, weakness   Diazepam  Other (See Comments)    Patient states that diazepam  doesn't relax, it has the opposite effect.  Valium    Lorazepam  Itching   Sulfa Antibiotics Itching

## 2024-06-14 NOTE — Progress Notes (Signed)
 Per Message from [facility].    Saw Rhonda [name] in the home today following a phone call with [name], RN who evaluated her Optivol indicating fluid accumulation. Today her weight is down 5 lbs, no lower leg edema, no abdominal distention, lungs clear, no JVD. She has no shortness of breath or shortness of breath on exertion. She has only missed one evening dose of meds this week on Monday.       She sent to [facility] Triage for follow up.

## 2024-06-14 NOTE — Telephone Encounter (Signed)
 Shamond Skelton, comm paramedic is aware and will advise pt and ensure she takes extra dose today

## 2024-06-15 NOTE — Progress Notes (Signed)
 Buell Powell HERO, RN    06/14/24  3:26 PM Note Powell, comm paramedic is aware and will advise pt and ensure she takes extra dose today    [place], Harlene HERO, FNP to Buell Powell HERO, RN     06/14/24  2:52 PM OK to take PRN metolazone  2.5 mg dose today with extra 40 KCL x 1

## 2024-06-16 NOTE — Progress Notes (Signed)
 Remote ICD Transmission

## 2024-06-18 ENCOUNTER — Ambulatory Visit: Payer: Self-pay | Admitting: Cardiology

## 2024-06-19 ENCOUNTER — Other Ambulatory Visit: Payer: Self-pay | Admitting: Neurology

## 2024-06-19 ENCOUNTER — Other Ambulatory Visit (HOSPITAL_COMMUNITY): Payer: Self-pay | Admitting: Internal Medicine

## 2024-06-19 DIAGNOSIS — E1121 Type 2 diabetes mellitus with diabetic nephropathy: Secondary | ICD-10-CM

## 2024-06-21 ENCOUNTER — Other Ambulatory Visit (HOSPITAL_COMMUNITY): Payer: Self-pay

## 2024-06-21 ENCOUNTER — Ambulatory Visit

## 2024-06-21 DIAGNOSIS — I5022 Chronic systolic (congestive) heart failure: Secondary | ICD-10-CM

## 2024-06-21 DIAGNOSIS — Z95810 Presence of automatic (implantable) cardiac defibrillator: Secondary | ICD-10-CM

## 2024-06-21 NOTE — Progress Notes (Signed)
 EPIC Encounter for ICM Monitoring  Patient Name: Rhonda [name] is a 74 y.o. female Date: 06/21/2024 Primary Care Physican: Chrystal Lamarr GORMAN, MD Primary Cardiologist: Bensimhon Electrophysiologist: Kennyth Pore Pacing:  100%   06/01/2022 Weight: 193 lbs  04/26/2023 Weight: 214 lbs Per EMT note 07/14/2023 Weight: 226 lbs 10/13/2023 Office Weight: 210 lbs 12/15/2023 Weight: 206.3 lbs per [name], paramedic visit 01/12/2024 Weight: 205.8 lbs per [name], paramedic visit  01/19/2024 Weight: 203 lbs 02/10/2024 Weight: 202 lbs per [name] 03/22/2024 Weight: 205 lbs per [name] 04/12/2024 Weight: 198.9 per [name] 06/07/2024 Weight: 192 lbs per [name]   Clinical Status (14-Jun-2024 to 21-Jun-2024) Time in AT/AF 0.0 hr/day (0.0%)          Transmission results reviewed and message sent to [name], EMT with paramedicine program.   Powell has a home visit scheduled for 06/21/2024.                Since 06/14/2024 ICM Remote Transmission: Optivol thoracic impedance suggesting fluid levels returned to normal 06/14/2024 after HF clinic recommendation to take extra Metolazone .   Prescribed:  [name] with paramedicine program assists with meds.   Torsemide  100 mg 1 tablet(s) (100 mg total) by mouth twice a day Potassium 20 meq 3 tablets (60 mEq total) by mouth 2 (twice) a day.   Metolazone  2.5 mg 1 tablet by mouth once a week (every Tuesday).  Per 05/03/2024 HF clinic ov note: OK to take extra metolazone  dose PRN (requiring extra dose during the week about 1x/month)  Spironolactone  25 mg take 1 tablet by mouth every evening.   Labs: 05/03/2024 Creatinine 2.73, BUN 47, Potassium 4.0, Sodium 139, GFR 18 03/16/2024 Creatinine 2.26, BUN 49, Potassium 3.5, Sodium 134, GFR 23 03/01/2024 Creatinine 2.56, BUN 53, Potassium 4.5, Sodium 132, GFR 16 02/23/2024 Creatinine 2.29,  BUN 46, Potassium 3.5, Sodium 134, GFR 22 01/26/2024 Creatinine 1.99, BUN 38, Potassium 3.7, Sodium 135, GFR 26, BNP 29.3 10/25/2023 Creatinine 1.78, BUN 46, Potassium 3.3, Sodium 136, GFR 30 10/13/2023 Creatinine 1.91, BUN 41, Potassium 3.5, Sodium 135, GFR 28, BNP 37.5 07/28/2023 Creatinine 2.06, BUN 52, Potassium 3.5, Sodium 135, eGFR 25 07/14/2023 Creatinine 1.91, BUN 47, Potassium 3.3, Sodium 134, eGFR 28, BNP 71.5 A complete set of results can be found in Results Review.   Recommendations:  Message sent to [name], [name] with results and to contact HF clinic for any future needs.      Follow-up plan: ICM clinic 31 day follow up currently on hold but 91 day remote monitoring will continue.     91 day device clinic remote transmission 09/11/2024.     EP/Cardiology Office Visits:   08/02/2024 with HF Clinic.      Copy of ICM check sent to Dr. Kennyth.    Remote monitoring is medically necessary for Heart Failure Management.    Daily Thoracic Impedance ICM trend: 03/22/2024 through 06/21/2024.    12-14 Month Thoracic Impedance ICM trend:     [name], RN 06/21/2024 8:08 AM

## 2024-06-21 NOTE — Progress Notes (Signed)
 Paramedicine Encounter    Patient ID: Rhonda [name], female    DOB: Oct 09, 1950, 74 y.o.   MRN: [id number]   Complaints- headache with general fatigue   Assessment- CAOX4, warm and dry ambulatory without shortness of breath, no lower leg edema, lungs clear, vitals stable. No weight gain. Optivol suggests fluid back to baseline.   Compliance with meds- missed one night dose   Pill box filled- for one week   Refills needed- carvedilol  and risperidone    Meds changes since last visit- none     Social changes- interested in guided exercise program- prep- cardiac rehab, etc.    VISIT SUMMARY- Arrived for home visit for Rhonda [name] who reports to be feeling okay but admits to having a headache with general fatigue. She says this is an ongoing issue and she is tired of feeling this way and wants to make a change and is agreeable to a guided exercise program- now that her daughter is living there she has a way to get to all appointments and has encouragement. I will reach out to [facility] and see what she qualifies for. Vitals obtained and assessment obtained. Meds reviewed and pill box filled for one week. Refills called into [facility]. Appointments reviewed and confirmed. Home visit complete. I will see Rhonda [name] in one week.   BP 102/64   Pulse 83   Resp 16   Wt 186 lb 11.2 oz (84.7 kg)   SpO2 97%   BMI 33.07 kg/m  Weight yesterday-- didn't weigh  Last visit weight-- 187      ACTION: Home visit completed     Patient Care Team: Chrystal Lamarr GORMAN, MD as PCP - General (Family Medicine) Waddell Danelle ORN, MD as PCP - Electrophysiology (Cardiology) Cathern Andriette DEL, LCSW as Social Worker (Licensed Clinical Social Worker) Tobie, Tonita POUR, DO as Consulting Physician (Neurology) Jacques Moats, Paramedic as Paramedic Chrystal, Lamarr GORMAN, MD as Attending Physician (Family Medicine) Bensimhon, Toribio SAUNDERS, MD as Consulting Physician (Cardiology) Curry, Leni DASEN, MD as Consulting Physician  (Psychiatry) Tobie Franky SQUIBB, DPM as Consulting Physician (Podiatry) Aniceto Daphne CROME, NP as Nurse Practitioner (Cardiology) Gaynel Delon CROME, DPM as Consulting Physician (Podiatry)  Patient Active Problem List   Diagnosis Date Noted   Mild dementia 04/20/2023   Diabetic neuropathy    Elevated troponin 12/02/2021   Acute left ankle pain 10/11/2021   Hyponatremia 10/08/2021   Decreased estrogen level 08/01/2021   Hyperparathyroidism 08/01/2021   Spondylolisthesis 08/01/2021   Varicose veins of bilateral lower extremities with other complications 08/01/2021   Lumbago without sciatica 04/30/2021   Abnormal gait 06/11/2020   Allergic rhinitis 06/11/2020   Asthma 06/11/2020   Benign intracranial hypertension 06/11/2020   Body mass index (BMI) 45.0-49.9, adult 06/11/2020   Bowel incontinence 06/11/2020   Cholelithiasis 06/11/2020   Chronic sinusitis 06/11/2020   Cleft palate 06/11/2020   Daytime somnolence 06/11/2020   Edema 06/11/2020   Family history of malignant neoplasm of gastrointestinal tract 06/11/2020   Gout 06/11/2020   Insomnia 06/11/2020   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   Lumbar spondylosis with myelopathy 06/11/2020   Macrocytosis 06/11/2020   History of colonic polyps 06/11/2020   Repeated falls 06/11/2020   Irritable bowel syndrome 01/04/2020   Spinal stenosis of lumbar region 01/03/2020   Bilateral leg weakness 08/01/2019   Type II diabetes mellitus 06/15/2019   Hypokalemia 11/08/2018   CKD (chronic kidney disease) stage 3, GFR 30-59 ml/min 11/08/2018   Orthostatic hypotension 07/28/2017   On home oxygen  therapy  Migraines    Left bundle branch block    Hypothyroidism    Hypertension    Hyperlipidemia    Heart murmur    GERD (gastroesophageal reflux disease)    Exertional shortness of breath    Major depressive disorder    Back pain    Arthritis    Generalized anxiety disorder    Anemia    Chronic respiratory failure 09/14/2013   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   Chronic systolic heart failure 10/27/2012   Endometrial polyp 01/20/2012   Malignant tumor of breast 03/26/2011   Vitamin D  deficiency 03/26/2011   Current Medications[1] Allergies[2]   Social History   Socioeconomic History   Marital status: Married    Spouse name: [place]   Number of children: 2   Years of education: 18   Highest education level: Master's degree (e.g., MA, MS, MEng, MEd, MSW, MBA)  Occupational History   Occupation: retired  Tobacco Use   Smoking status: Every Day    Current packs/day: 0.00    Average packs/day: 0.5 packs/day for 26.0 years (13.0 ttl pk-yrs)    Types: Cigarettes    Start date: 03/24/1995    Last attempt to quit: 03/23/2021    Years since quitting: 3.2   Smokeless tobacco: Never  Vaping Use   Vaping status: Never Used  Substance and Sexual Activity   Alcohol use: No   Drug use: No   Sexual activity: Yes  Other Topics Concern   Not on file  Social History Narrative   Tobacco Use Cigarettes: Former Smoker, Quit in 2008   No Alcohol   No recreational drug use   Diet: Regular/Low Carb   Exercise: None   Occupation: disabled   Education: Company Secretary, masters   Children: 2   Firearms: No   Risk Analyst Use: Always   Former wellsite geologist.    Right handed   Two story home   Lives with spouse [place]      Social Drivers of Health   Tobacco Use: High Risk (05/03/2024)   Patient History    Smoking Tobacco Use: Every Day    Smokeless Tobacco Use: Never    Passive Exposure: Not on file  Financial Resource Strain: Low Risk (09/17/2023)   Overall Financial Resource Strain (CARDIA)    Difficulty of Paying Living Expenses: Not very hard  Food Insecurity: No Food Insecurity (03/06/2024)   Epic    Worried About Programme Researcher, Broadcasting/film/video in the Last Year: Never true    Ran Out of Food in the Last Year: Never true  Transportation Needs: No Transportation Needs (03/06/2024)   Epic     Lack of Transportation (Medical): No    Lack of Transportation (Non-Medical): No  Physical Activity: Insufficiently Active (09/17/2023)   Exercise Vital Sign    Days of Exercise per Week: 1 day    Minutes of Exercise per Session: 20 min  Stress: Stress Concern Present (09/17/2023)   [name] of Occupational Health - Occupational Stress Questionnaire    Feeling of Stress : To some extent  Social Connections: Socially Integrated (09/17/2023)   Social Connection and Isolation Panel    Frequency of Communication with Friends and Family: Twice a week    Frequency of Social Gatherings with Friends and Family: Twice a week    Attends Religious Services: 1 to 4 times per year    Active Member of Clubs or Organizations: Patient unable to answer    Attends Club or  Organization Meetings: 1 to 4 times per year    Marital Status: Married  Catering Manager Violence: Not At Risk (03/06/2024)   Epic    Fear of Current or Ex-Partner: No    Emotionally Abused: No    Physically Abused: No    Sexually Abused: No  Depression (PHQ2-9): Low Risk (09/17/2023)   Depression (PHQ2-9)    PHQ-2 Score: 1  Alcohol Screen: Low Risk (09/17/2023)   Alcohol Screen    Last Alcohol Screening Score (AUDIT): 0  Housing: Unknown (03/06/2024)   Epic    Unable to Pay for Housing in the Last Year: No    Number of Times Moved in the Last Year: Not on file    Homeless in the Last Year: No  Utilities: Not At Risk (03/06/2024)   Epic    Threatened with loss of utilities: No  Health Literacy: Inadequate Health Literacy (09/17/2023)   B1300 Health Literacy    Frequency of need for help with medical instructions: Often    Physical Exam      Future Appointments  Date Time Provider Department Center  08/02/2024  3:00 PM [facility] PA/NP [facility] None  08/09/2024  4:00 PM Gaynel Delon CROME, DPM [facility]  09/07/2024  3:20 PM Arfeen, Leni DASEN, MD [facility] None  09/11/2024  7:10 AM [facility] H[facility] DEVICE REMOTES  [facility]  12/11/2024  7:10 AM [facility] HVT DEVICE REMOTES [facility]  03/12/2025  7:10 AM [facility] HVT DEVICE REMOTES [facility]  05/02/2025  8:50 AM Patel, Donika K, DO [facility] None                  [1]  Current Outpatient Medications:    acetaminophen  (TYLENOL ) 500 MG tablet, Take 500 mg by mouth every 6 (six) hours as needed., Disp: , Rfl:    acetaZOLAMIDE  (DIAMOX ) 125 MG tablet, Take 1 tablet (125 mg total) by mouth daily., Disp: 90 tablet, Rfl: 3   albuterol  (VENTOLIN  HFA) 108 (90 Base) MCG/ACT inhaler, INHALE TWO PUFFS BY MOUTH EVERY 4 HOURS AS NEEDED, Disp: , Rfl:    allopurinol  (ZYLOPRIM ) 100 MG tablet, Take 0.5 tablets (50 mg total) by mouth daily., Disp: 45 tablet, Rfl: 2   azelastine  (ASTELIN ) 0.1 % nasal spray, Place 2 sprays into both nostrils 2 (two) times daily. Use in each nostril as directed, Disp: 30 mL, Rfl: 12   buPROPion  (WELLBUTRIN  XL) 150 MG 24 hr tablet, Take 1 tablet (150 mg total) by mouth daily., Disp: 30 tablet, Rfl: 5   Capsaicin 0.1 % CREA, 1 application as needed Externally Three times a day as needed, Disp: , Rfl:    carvedilol  (COREG ) 3.125 MG tablet, TAKE 1 TABLET (3.125 MG) BY MOUTH TWICE DAILY WITH MEALS, Disp: 180 tablet, Rfl: 3   citalopram  (CELEXA ) 20 MG tablet, Take 1 tablet (20 mg total) by mouth daily., Disp: 30 tablet, Rfl: 5   clonazePAM  (KLONOPIN ) 0.5 MG tablet, Take 1 tablet (0.5 mg total) by mouth at bedtime., Disp: 30 tablet, Rfl: 5   cyanocobalamin  (VITAMIN B12) 1000 MCG tablet, Take 1,000 mcg by mouth daily., Disp: , Rfl:    desloratadine  (CLARINEX ) 5 MG tablet, TAKE 1 TABLET (5 MG TOTAL) BY MOUTH DAILY FOR ALLERGIES, Disp: 90 tablet, Rfl: 3   famotidine (PEPCID) 20 MG tablet, Take 20 mg by mouth as needed for heartburn or indigestion., Disp: , Rfl:    ferrous sulfate  325 (65 FE) MG tablet, 1 tablet Orally Once a day, Disp: , Rfl:  fluticasone  (FLONASE ) 50 MCG/ACT nasal spray, Place 1 spray into both nostrils daily., Disp: , Rfl:     Furosemide  (FUROSCIX ) 80 MG/10ML CTKT, Inject 80 mg into the skin daily as needed. As directed by the heart failure clinic, Disp: 5 each, Rfl: 1   gabapentin  (NEURONTIN ) 100 MG capsule, Take 1 capsule (100 mg total) by mouth 2 (two) times daily., Disp: 180 capsule, Rfl: 3   ipratropium (ATROVENT ) 0.03 % nasal spray, , Disp: , Rfl:    levothyroxine  (SYNTHROID ) 88 MCG tablet, TAKE 1 TABLET(88 MCG) BY MOUTH DAILY BEFORE BREAKFAST, Disp: 90 tablet, Rfl: 3   memantine  (NAMENDA ) 5 MG tablet, Take 5 mg by mouth 2 (two) times daily., Disp: , Rfl:    metolazone  (ZAROXOLYN ) 2.5 MG tablet, TAKE 1 TABLET BY MOUTH ONCE A WEEK, Disp: 4 tablet, Rfl: 1   midodrine  (PROAMATINE ) 5 MG tablet, Take 1 tablet (5 mg total) by mouth 3 (three) times daily., Disp: 200 tablet, Rfl: 3   Multiple Vitamin (MULTIVITAMIN WITH MINERALS) TABS tablet, Take 1 tablet by mouth daily., Disp: , Rfl:    phenol (CHLORASEPTIC) 1.4 % LIQD, Use as directed 1 spray in the mouth or throat as needed for throat irritation / pain., Disp: , Rfl:    potassium chloride  SA (KLOR-CON  M) 20 MEQ tablet, Take 3 tablets (60 mEq total) by mouth 2 (two) times daily., Disp: 572 tablet, Rfl: 3   risperiDONE  (RISPERDAL ) 0.5 MG tablet, Take 1 tablet (0.5 mg total) by mouth 2 (two) times daily at 8 am and 4 pm., Disp: 60 tablet, Rfl: 5   simvastatin  (ZOCOR ) 10 MG tablet, Take 1 tablet (10 mg total) by mouth every evening., Disp: 90 tablet, Rfl: 3   spironolactone  (ALDACTONE ) 25 MG tablet, TAKE 1 TABLET (25 MG TOTAL) BY MOUTH EVERY EVENING., Disp: 90 tablet, Rfl: 3   tirzepatide  (MOUNJARO ) 15 MG/0.5ML Pen, Inject 15 mg into the skin once a week., Disp: 2 mL, Rfl: 3   torsemide  (DEMADEX ) 100 MG tablet, TAKE 1 TABLET BY MOUTH 2 TIMES DAILY., Disp: 180 tablet, Rfl: 3   dicyclomine  (BENTYL ) 20 MG tablet, TAKE 1 TABLET BY MOUTH THREE TIMES DAILY 30 MINUTES BEFORE EATING (Patient not taking: Reported on 06/21/2024), Disp: , Rfl:  [2]  Allergies Allergen Reactions    Ceftin Anaphylaxis    Face and throat swell    Cefuroxime Axetil Anaphylaxis    Face and throat swell   Geodon [Ziprasidone Hcl] Hives   Lisinopril  Other (See Comments)    angioedema  lisinopril   Other Reaction(s): angioedema   Shellfish Allergy Other (See Comments)    Gout exacerbation  shellfish derived   Shellfish Protein-Containing Drug Products Other (See Comments)    Other reaction(s): Other  shellfish derived   Cefuroxime     Other reaction(s): anaphylaxis  Other Reaction(s): Unknown   Sulfacetamide Sodium-Sulfur      Other reaction(s): itch   Ziprasidone     Other reaction(s): hives on left arm  Other Reaction(s): hives on left arm   Allopurinol  Nausea Only and Other (See Comments)    weakness  Other Reaction(s): nausea, weakness   Diazepam  Other (See Comments)    Patient states that diazepam  doesn't relax, it has the opposite effect.  Valium    Lorazepam  Itching   Sulfa Antibiotics Itching

## 2024-06-28 ENCOUNTER — Telehealth (HOSPITAL_COMMUNITY): Payer: Self-pay

## 2024-06-28 ENCOUNTER — Other Ambulatory Visit (HOSPITAL_COMMUNITY): Payer: Self-pay

## 2024-06-28 NOTE — Progress Notes (Unsigned)
 Paramedicine Encounter    Patient ID: Rhonda [name] Potters, female    DOB: [birth year], 74 y.o.   MRN: [id number]   Patient Care Team: Chrystal Lamarr GORMAN, MD as PCP - General (Family Medicine) Waddell Danelle ORN, MD as PCP - Electrophysiology (Cardiology) Cathern Andriette DEL, LCSW as Social Worker (Licensed Clinical Social Worker) Tobie, Tonita POUR, DO as Consulting Physician (Neurology) Jacques Moats, Paramedic as Paramedic Chrystal, Lamarr GORMAN, MD as Attending Physician (Family Medicine) Bensimhon, Toribio SAUNDERS, MD as Consulting Physician (Cardiology) Curry Leni DASEN, MD as Consulting Physician (Psychiatry) Tobie Franky SQUIBB, DPM as Consulting Physician (Podiatry) Aniceto Daphne CROME, NP as Nurse Practitioner (Cardiology) Gaynel Delon CROME, DPM as Consulting Physician (Podiatry)  Patient Active Problem List   Diagnosis Date Noted   Mild dementia 04/20/2023   Diabetic neuropathy    Elevated troponin 12/02/2021   Acute left ankle pain 10/11/2021   Hyponatremia 10/08/2021   Decreased estrogen level 08/01/2021   Hyperparathyroidism 08/01/2021   Spondylolisthesis 08/01/2021   Varicose veins of bilateral lower extremities with other complications 08/01/2021   Lumbago without sciatica 04/30/2021   Abnormal gait 06/11/2020   Allergic rhinitis 06/11/2020   Asthma 06/11/2020   Benign intracranial hypertension 06/11/2020   Body mass index (BMI) 45.0-49.9, adult 06/11/2020   Bowel incontinence 06/11/2020   Cholelithiasis 06/11/2020   Chronic sinusitis 06/11/2020   Cleft palate 06/11/2020   Daytime somnolence 06/11/2020   Edema 06/11/2020   Family history of malignant neoplasm of gastrointestinal tract 06/11/2020   Gout 06/11/2020   Insomnia 06/11/2020   Atrophic gastritis 06/11/2020   Lumbar spondylosis with myelopathy 06/11/2020   Macrocytosis 06/11/2020   History of colonic polyps 06/11/2020   Repeated falls 06/11/2020   Irritable bowel syndrome 01/04/2020   Spinal stenosis of lumbar region  01/03/2020   Bilateral leg weakness 08/01/2019   Type II diabetes mellitus 06/15/2019   Hypokalemia 11/08/2018   CKD (chronic kidney disease) stage 3, GFR 30-59 ml/min 11/08/2018   Orthostatic hypotension 07/28/2017   On home oxygen  therapy    Migraines    Left bundle branch block    Hypothyroidism    Hypertension    Hyperlipidemia    Heart murmur    GERD (gastroesophageal reflux disease)    Exertional shortness of breath    Major depressive disorder    Back pain    Arthritis    Generalized anxiety disorder    Anemia    Chronic respiratory failure 09/14/2013   Biventricular ICD (implantable cardioverter-defibrillator) in place 08/04/2013   Chronic systolic heart failure 10/27/2012   Endometrial polyp 01/20/2012   Malignant tumor of breast 03/26/2011   Vitamin D  deficiency 03/26/2011   Current Medications[1] Allergies[2]   Social History   Socioeconomic History   Marital status: Married    Spouse name: [place]   Number of children: 2   Years of education: 18   Highest education level: Master's degree (e.g., MA, MS, MEng, MEd, MSW, MBA)  Occupational History   Occupation: retired  Tobacco Use   Smoking status: Every Day    Current packs/day: 0.00    Average packs/day: 0.5 packs/day for 26.0 years (13.0 ttl pk-yrs)    Types: Cigarettes    Start date: 03/24/1995    Last attempt to quit: 03/23/2021    Years since quitting: 3.2   Smokeless tobacco: Never  Vaping Use   Vaping status: Never Used  Substance and Sexual Activity   Alcohol use: No   Drug use: No   Sexual activity: Yes  Other Topics Concern   Not on file  Social History Narrative   Tobacco Use Cigarettes: Former Smoker, Quit in 2008   No Alcohol   No recreational drug use   Diet: Regular/Low Carb   Exercise: None   Occupation: disabled   Education: Company Secretary, masters   Children: 2   Firearms: No   Risk Analyst Use: Always   Former wellsite geologist.    Right handed   Two story home   Lives with  spouse [place]      Social Drivers of Health   Tobacco Use: High Risk (05/03/2024)   Patient History    Smoking Tobacco Use: Every Day    Smokeless Tobacco Use: Never    Passive Exposure: Not on file  Financial Resource Strain: Low Risk (09/17/2023)   Overall Financial Resource Strain (CARDIA)    Difficulty of Paying Living Expenses: Not very hard  Food Insecurity: No Food Insecurity (03/06/2024)   Epic    Worried About Programme Researcher, Broadcasting/film/video in the Last Year: Never true    Ran Out of Food in the Last Year: Never true  Transportation Needs: No Transportation Needs (03/06/2024)   Epic    Lack of Transportation (Medical): No    Lack of Transportation (Non-Medical): No  Physical Activity: Insufficiently Active (09/17/2023)   Exercise Vital Sign    Days of Exercise per Week: 1 day    Minutes of Exercise per Session: 20 min  Stress: Stress Concern Present (09/17/2023)   [name]ational [employer] - Occupational Stress Questionnaire    Feeling of Stress : To some extent  Social Connections: Socially Integrated (09/17/2023)   Social Connection and Isolation Panel    Frequency of Communication with Friends and Family: Twice a week    Frequency of Social Gatherings with Friends and Family: Twice a week    Attends Religious Services: 1 to 4 times per year    Active Member of Clubs or Organizations: Patient unable to answer    Attends Banker Meetings: 1 to 4 times per year    Marital Status: Married  Catering Manager Violence: Not At Risk (03/06/2024)   Epic    Fear of Current or Ex-Partner: No    Emotionally Abused: No    Physically Abused: No    Sexually Abused: No  Depression (PHQ2-9): Low Risk (09/17/2023)   Depression (PHQ2-9)    PHQ-2 Score: 1  Alcohol Screen: Low Risk (09/17/2023)   Alcohol Screen    Last Alcohol Screening Score (AUDIT): 0  Housing: Unknown (03/06/2024)   Epic    Unable to Pay for Housing in the Last Year: No    Number of Times Moved  in the Last Year: Not on file    Homeless in the Last Year: No  Utilities: Not At Risk (03/06/2024)   Epic    Threatened with loss of utilities: No  Health Literacy: Inadequate Health Literacy (09/17/2023)   B1300 Health Literacy    Frequency of need for help with medical instructions: Often    Physical Exam      Future Appointments  Date Time Provider Department Center  08/02/2024  3:00 PM [facility] PA/NP [facility] None  08/09/2024  4:00 PM Gaynel Delon CROME, DPM [facility]  09/07/2024  3:20 PM Arfeen, Leni DASEN, MD [facility] None  09/11/2024  7:10 AM [facility] HVT DEVICE REMOTES [facility]  12/11/2024  7:10 AM [facility] HVT DEVICE REMOTES [facility]  03/12/2025  7:10 AM [facility] HVT DEVICE  REMOTES [facility]  05/02/2025  8:50 AM Patel, Donika K, DO [facility] None     ACTION: {Paramed [phone]}          [1]  Current Outpatient Medications:    acetaminophen  (TYLENOL ) 500 MG tablet, Take 500 mg by mouth every 6 (six) hours as needed., Disp: , Rfl:    acetaZOLAMIDE  (DIAMOX ) 125 MG tablet, Take 1 tablet (125 mg total) by mouth daily., Disp: 90 tablet, Rfl: 3   albuterol  (VENTOLIN  HFA) 108 (90 Base) MCG/ACT inhaler, INHALE TWO PUFFS BY MOUTH EVERY 4 HOURS AS NEEDED, Disp: , Rfl:    allopurinol  (ZYLOPRIM ) 100 MG tablet, Take 0.5 tablets (50 mg total) by mouth daily., Disp: 45 tablet, Rfl: 2   azelastine  (ASTELIN ) 0.1 % nasal spray, Place 2 sprays into both nostrils 2 (two) times daily. Use in each nostril as directed, Disp: 30 mL, Rfl: 12   buPROPion  (WELLBUTRIN  XL) 150 MG 24 hr tablet, Take 1 tablet (150 mg total) by mouth daily., Disp: 30 tablet, Rfl: 5   Capsaicin 0.1 % CREA, 1 application as needed Externally Three times a day as needed, Disp: , Rfl:    carvedilol  (COREG ) 3.125 MG tablet, TAKE 1 TABLET (3.125 MG) BY MOUTH TWICE DAILY WITH MEALS, Disp: 180 tablet, Rfl: 3   citalopram  (CELEXA ) 20 MG tablet, Take 1 tablet (20 mg total) by mouth daily., Disp: 30 tablet, Rfl:  5   clonazePAM  (KLONOPIN ) 0.5 MG tablet, Take 1 tablet (0.5 mg total) by mouth at bedtime., Disp: 30 tablet, Rfl: 5   cyanocobalamin  (VITAMIN B12) 1000 MCG tablet, Take 1,000 mcg by mouth daily., Disp: , Rfl:    desloratadine  (CLARINEX ) 5 MG tablet, TAKE 1 TABLET (5 MG TOTAL) BY MOUTH DAILY FOR ALLERGIES, Disp: 90 tablet, Rfl: 3   dicyclomine  (BENTYL ) 20 MG tablet, TAKE 1 TABLET BY MOUTH THREE TIMES DAILY 30 MINUTES BEFORE EATING (Patient not taking: Reported on 06/21/2024), Disp: , Rfl:    famotidine (PEPCID) 20 MG tablet, Take 20 mg by mouth as needed for heartburn or indigestion., Disp: , Rfl:    ferrous sulfate  325 (65 FE) MG tablet, 1 tablet Orally Once a day, Disp: , Rfl:    fluticasone  (FLONASE ) 50 MCG/ACT nasal spray, Place 1 spray into both nostrils daily., Disp: , Rfl:    Furosemide  (FUROSCIX ) 80 MG/10ML CTKT, Inject 80 mg into the skin daily as needed. As directed by the heart failure clinic, Disp: 5 each, Rfl: 1   gabapentin  (NEURONTIN ) 100 MG capsule, Take 1 capsule (100 mg total) by mouth 2 (two) times daily., Disp: 180 capsule, Rfl: 3   ipratropium (ATROVENT ) 0.03 % nasal spray, , Disp: , Rfl:    levothyroxine  (SYNTHROID ) 88 MCG tablet, TAKE 1 TABLET(88 MCG) BY MOUTH DAILY BEFORE BREAKFAST, Disp: 90 tablet, Rfl: 3   memantine  (NAMENDA ) 5 MG tablet, Take 5 mg by mouth 2 (two) times daily., Disp: , Rfl:    metolazone  (ZAROXOLYN ) 2.5 MG tablet, TAKE 1 TABLET BY MOUTH ONCE A WEEK, Disp: 4 tablet, Rfl: 1   midodrine  (PROAMATINE ) 5 MG tablet, Take 1 tablet (5 mg total) by mouth 3 (three) times daily., Disp: 200 tablet, Rfl: 3   Multiple Vitamin (MULTIVITAMIN WITH MINERALS) TABS tablet, Take 1 tablet by mouth daily., Disp: , Rfl:    phenol (CHLORASEPTIC) 1.4 % LIQD, Use as directed 1 spray in the mouth or throat as needed for throat irritation / pain., Disp: , Rfl:    potassium chloride  SA (KLOR-CON  M) 20  MEQ tablet, Take 3 tablets (60 mEq total) by mouth 2 (two) times daily., Disp: 572  tablet, Rfl: 3   risperiDONE  (RISPERDAL ) 0.5 MG tablet, Take 1 tablet (0.5 mg total) by mouth 2 (two) times daily at 8 am and 4 pm., Disp: 60 tablet, Rfl: 5   simvastatin  (ZOCOR ) 10 MG tablet, Take 1 tablet (10 mg total) by mouth every evening., Disp: 90 tablet, Rfl: 3   spironolactone  (ALDACTONE ) 25 MG tablet, TAKE 1 TABLET (25 MG TOTAL) BY MOUTH EVERY EVENING., Disp: 90 tablet, Rfl: 3   tirzepatide  (MOUNJARO ) 15 MG/0.5ML Pen, Inject 15 mg into the skin once a week., Disp: 2 mL, Rfl: 3   torsemide  (DEMADEX ) 100 MG tablet, TAKE 1 TABLET BY MOUTH 2 TIMES DAILY., Disp: 180 tablet, Rfl: 3 [2]  Allergies Allergen Reactions   Ceftin Anaphylaxis    Face and throat swell    Cefuroxime Axetil Anaphylaxis    Face and throat swell   Geodon [Ziprasidone Hcl] Hives   Lisinopril  Other (See Comments)    angioedema  lisinopril   Other Reaction(s): angioedema   Shellfish Allergy Other (See Comments)    Gout exacerbation  shellfish derived   Shellfish Protein-Containing Drug Products Other (See Comments)    Other reaction(s): Other  shellfish derived   Cefuroxime     Other reaction(s): anaphylaxis  Other Reaction(s): Unknown   Sulfacetamide Sodium-Sulfur      Other reaction(s): itch   Ziprasidone     Other reaction(s): hives on left arm  Other Reaction(s): hives on left arm   Allopurinol  Nausea Only and Other (See Comments)    weakness  Other Reaction(s): nausea, weakness   Diazepam  Other (See Comments)    Patient states that diazepam  doesn't relax, it has the opposite effect.  Valium    Lorazepam  Itching   Sulfa Antibiotics Itching

## 2024-06-28 NOTE — Telephone Encounter (Signed)
 Spoke to Rhonda [name] again today and she is still interested in some type of guided exercise- she has attempted this before but now her daughter is living in the home and has more support- hoping she will be more successful.   I will forward to RN with [facility] to see what she may qualify for (cardiac rehab, PREP, etc)   [name], EMT-Paramedic [phone] 06/28/2024

## 2024-08-02 ENCOUNTER — Ambulatory Visit (HOSPITAL_COMMUNITY)

## 2024-08-09 ENCOUNTER — Ambulatory Visit: Admitting: Podiatry

## 2024-09-07 ENCOUNTER — Ambulatory Visit (HOSPITAL_COMMUNITY): Admitting: Psychiatry

## 2025-05-02 ENCOUNTER — Ambulatory Visit: Admitting: Neurology
